# Patient Record
Sex: Female | Born: 1950 | Race: Black or African American | Hispanic: No | Marital: Single | Smoking: Current some day smoker
Health system: Southern US, Community
[De-identification: ages and names within clinical notes are randomized; demographics above are authoritative.]

## PROBLEM LIST (undated history)

## (undated) DIAGNOSIS — D473 Essential (hemorrhagic) thrombocythemia: Secondary | ICD-10-CM

## (undated) DIAGNOSIS — J9600 Acute respiratory failure, unspecified whether with hypoxia or hypercapnia: Secondary | ICD-10-CM

## (undated) DIAGNOSIS — R079 Chest pain, unspecified: Secondary | ICD-10-CM

## (undated) DIAGNOSIS — M109 Gout, unspecified: Secondary | ICD-10-CM

## (undated) DIAGNOSIS — I5020 Unspecified systolic (congestive) heart failure: Secondary | ICD-10-CM

## (undated) DIAGNOSIS — I639 Cerebral infarction, unspecified: Secondary | ICD-10-CM

## (undated) DIAGNOSIS — E041 Nontoxic single thyroid nodule: Secondary | ICD-10-CM

## (undated) DIAGNOSIS — D649 Anemia, unspecified: Secondary | ICD-10-CM

## (undated) DIAGNOSIS — A419 Sepsis, unspecified organism: Secondary | ICD-10-CM

## (undated) DIAGNOSIS — L0291 Cutaneous abscess, unspecified: Secondary | ICD-10-CM

## (undated) DIAGNOSIS — J181 Lobar pneumonia, unspecified organism: Secondary | ICD-10-CM

## (undated) DIAGNOSIS — A599 Trichomoniasis, unspecified: Secondary | ICD-10-CM

## (undated) DIAGNOSIS — N2581 Secondary hyperparathyroidism of renal origin: Secondary | ICD-10-CM

## (undated) DIAGNOSIS — R4189 Other symptoms and signs involving cognitive functions and awareness: Secondary | ICD-10-CM

## (undated) DIAGNOSIS — N183 Chronic kidney disease, stage 3 unspecified: Secondary | ICD-10-CM

## (undated) DIAGNOSIS — Z720 Tobacco use: Secondary | ICD-10-CM

## (undated) DIAGNOSIS — I2510 Atherosclerotic heart disease of native coronary artery without angina pectoris: Secondary | ICD-10-CM

## (undated) DIAGNOSIS — J810 Acute pulmonary edema: Secondary | ICD-10-CM

## (undated) DIAGNOSIS — I517 Cardiomegaly: Secondary | ICD-10-CM

## (undated) DIAGNOSIS — I248 Other forms of acute ischemic heart disease: Secondary | ICD-10-CM

## (undated) DIAGNOSIS — I169 Hypertensive crisis, unspecified: Secondary | ICD-10-CM

## (undated) DIAGNOSIS — K5520 Angiodysplasia of colon without hemorrhage: Secondary | ICD-10-CM

## (undated) DIAGNOSIS — J811 Chronic pulmonary edema: Secondary | ICD-10-CM

## (undated) DIAGNOSIS — K922 Gastrointestinal hemorrhage, unspecified: Secondary | ICD-10-CM

## (undated) DIAGNOSIS — E785 Hyperlipidemia, unspecified: Secondary | ICD-10-CM

## (undated) DIAGNOSIS — N2889 Other specified disorders of kidney and ureter: Secondary | ICD-10-CM

## (undated) DIAGNOSIS — I739 Peripheral vascular disease, unspecified: Secondary | ICD-10-CM

## (undated) DIAGNOSIS — F1410 Cocaine abuse, uncomplicated: Secondary | ICD-10-CM

## (undated) DIAGNOSIS — H409 Unspecified glaucoma: Secondary | ICD-10-CM

## (undated) DIAGNOSIS — I5042 Chronic combined systolic (congestive) and diastolic (congestive) heart failure: Secondary | ICD-10-CM

## (undated) DIAGNOSIS — M19012 Primary osteoarthritis, left shoulder: Secondary | ICD-10-CM

## (undated) DIAGNOSIS — R449 Unspecified symptoms and signs involving general sensations and perceptions: Secondary | ICD-10-CM

## (undated) DIAGNOSIS — I2489 Other forms of acute ischemic heart disease: Secondary | ICD-10-CM

## (undated) DIAGNOSIS — D62 Acute posthemorrhagic anemia: Secondary | ICD-10-CM

## (undated) DIAGNOSIS — J9601 Acute respiratory failure with hypoxia: Secondary | ICD-10-CM

## (undated) DIAGNOSIS — I10 Essential (primary) hypertension: Secondary | ICD-10-CM

## (undated) DIAGNOSIS — IMO0001 Reserved for inherently not codable concepts without codable children: Secondary | ICD-10-CM

## (undated) DIAGNOSIS — J189 Pneumonia, unspecified organism: Secondary | ICD-10-CM

## (undated) DIAGNOSIS — R195 Other fecal abnormalities: Secondary | ICD-10-CM

## (undated) HISTORY — DX: Angiodysplasia of colon without hemorrhage: K55.20

## (undated) HISTORY — DX: Peripheral vascular disease, unspecified: I73.9

## (undated) HISTORY — DX: Secondary hyperparathyroidism of renal origin: N25.81

## (undated) HISTORY — DX: Chronic kidney disease, stage 3 unspecified: N18.30

## (undated) HISTORY — DX: Chronic kidney disease, stage 3 (moderate): N18.3

## (undated) HISTORY — DX: Primary osteoarthritis, left shoulder: M19.012

## (undated) HISTORY — DX: Other specified disorders of kidney and ureter: N28.89

## (undated) HISTORY — DX: Morbid (severe) obesity due to excess calories: E66.01

## (undated) HISTORY — DX: Trichomoniasis, unspecified: A59.9

## (undated) HISTORY — PX: CARDIAC CATHETERIZATION: SHX172

## (undated) HISTORY — DX: Essential (primary) hypertension: I10

## (undated) HISTORY — DX: Unspecified systolic (congestive) heart failure: I50.20

## (undated) HISTORY — DX: Chronic combined systolic (congestive) and diastolic (congestive) heart failure: I50.42

---
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
## 2007-01-04 ENCOUNTER — Inpatient Hospital Stay (HOSPITAL_COMMUNITY): Admission: EM | Admit: 2007-01-04 | Discharge: 2007-01-06 | Payer: Self-pay | Admitting: Emergency Medicine

## 2007-01-05 ENCOUNTER — Encounter (INDEPENDENT_AMBULATORY_CARE_PROVIDER_SITE_OTHER): Payer: Self-pay | Admitting: Cardiology

## 2007-01-05 ENCOUNTER — Encounter (INDEPENDENT_AMBULATORY_CARE_PROVIDER_SITE_OTHER): Payer: Self-pay | Admitting: Neurology

## 2007-01-05 ENCOUNTER — Ambulatory Visit: Payer: Self-pay | Admitting: Physical Medicine & Rehabilitation

## 2007-01-27 ENCOUNTER — Ambulatory Visit: Payer: Self-pay | Admitting: Nurse Practitioner

## 2007-02-10 ENCOUNTER — Ambulatory Visit: Payer: Self-pay | Admitting: *Deleted

## 2007-02-16 ENCOUNTER — Encounter (INDEPENDENT_AMBULATORY_CARE_PROVIDER_SITE_OTHER): Payer: Self-pay | Admitting: Nurse Practitioner

## 2007-02-16 ENCOUNTER — Ambulatory Visit: Payer: Self-pay | Admitting: Nurse Practitioner

## 2007-02-24 ENCOUNTER — Ambulatory Visit (HOSPITAL_COMMUNITY): Admission: RE | Admit: 2007-02-24 | Discharge: 2007-02-24 | Payer: Self-pay | Admitting: Family Medicine

## 2007-04-01 ENCOUNTER — Ambulatory Visit: Payer: Self-pay | Admitting: Internal Medicine

## 2007-08-20 ENCOUNTER — Ambulatory Visit: Payer: Self-pay | Admitting: Internal Medicine

## 2007-08-30 ENCOUNTER — Emergency Department (HOSPITAL_COMMUNITY): Admission: EM | Admit: 2007-08-30 | Discharge: 2007-08-30 | Payer: Self-pay | Admitting: Emergency Medicine

## 2007-11-04 ENCOUNTER — Emergency Department (HOSPITAL_COMMUNITY): Admission: EM | Admit: 2007-11-04 | Discharge: 2007-11-04 | Payer: Self-pay | Admitting: Emergency Medicine

## 2008-02-08 ENCOUNTER — Encounter (INDEPENDENT_AMBULATORY_CARE_PROVIDER_SITE_OTHER): Payer: Self-pay | Admitting: Nurse Practitioner

## 2008-02-08 ENCOUNTER — Ambulatory Visit: Payer: Self-pay | Admitting: Internal Medicine

## 2008-02-08 LAB — CONVERTED CEMR LAB
ALT: 15 units/L (ref 0–35)
AST: 16 units/L (ref 0–37)
Albumin: 4 g/dL (ref 3.5–5.2)
Alkaline Phosphatase: 88 units/L (ref 39–117)
CO2: 26 meq/L (ref 19–32)
Calcium: 9.6 mg/dL (ref 8.4–10.5)
Chloride: 105 meq/L (ref 96–112)
Eosinophils Absolute: 0.1 10*3/uL (ref 0.0–0.7)
Glucose, Bld: 159 mg/dL — ABNORMAL HIGH (ref 70–99)
Hemoglobin: 14.7 g/dL (ref 12.0–15.0)
Lymphocytes Relative: 47 % — ABNORMAL HIGH (ref 12–46)
Lymphs Abs: 3.1 10*3/uL (ref 0.7–4.0)
MCHC: 32.5 g/dL (ref 30.0–36.0)
MCV: 80.9 fL (ref 78.0–100.0)
Monocytes Absolute: 0.6 10*3/uL (ref 0.1–1.0)
Neutro Abs: 2.8 10*3/uL (ref 1.7–7.7)
Platelets: 288 10*3/uL (ref 150–400)
RBC: 5.59 M/uL — ABNORMAL HIGH (ref 3.87–5.11)
TSH: 2.154 microintl units/mL (ref 0.350–5.50)
Total Bilirubin: 0.3 mg/dL (ref 0.3–1.2)
Total Protein: 7.1 g/dL (ref 6.0–8.3)

## 2008-03-21 ENCOUNTER — Encounter (INDEPENDENT_AMBULATORY_CARE_PROVIDER_SITE_OTHER): Payer: Self-pay | Admitting: Nurse Practitioner

## 2008-03-21 ENCOUNTER — Ambulatory Visit: Payer: Self-pay | Admitting: Family Medicine

## 2008-03-21 LAB — CONVERTED CEMR LAB
Cholesterol: 154 mg/dL (ref 0–200)
HDL: 33 mg/dL — ABNORMAL LOW (ref >39)
VLDL: 34 mg/dL (ref 0–40)

## 2008-03-22 ENCOUNTER — Emergency Department (HOSPITAL_COMMUNITY): Admission: EM | Admit: 2008-03-22 | Discharge: 2008-03-22 | Payer: Self-pay | Admitting: Emergency Medicine

## 2008-03-24 ENCOUNTER — Ambulatory Visit (HOSPITAL_COMMUNITY): Admission: RE | Admit: 2008-03-24 | Discharge: 2008-03-24 | Payer: Self-pay | Admitting: Family Medicine

## 2008-05-04 ENCOUNTER — Encounter (INDEPENDENT_AMBULATORY_CARE_PROVIDER_SITE_OTHER): Payer: Self-pay | Admitting: Nurse Practitioner

## 2008-05-04 ENCOUNTER — Ambulatory Visit: Payer: Self-pay | Admitting: Family Medicine

## 2008-05-04 LAB — CONVERTED CEMR LAB
Calcium: 9.6 mg/dL (ref 8.4–10.5)
Glucose, Bld: 131 mg/dL — ABNORMAL HIGH (ref 70–99)
LDL Cholesterol: 93 mg/dL (ref 0–99)
Total Bilirubin: 0.5 mg/dL (ref 0.3–1.2)
Triglycerides: 96 mg/dL (ref <150)

## 2009-01-10 ENCOUNTER — Ambulatory Visit: Payer: Self-pay | Admitting: Family Medicine

## 2009-01-10 LAB — CONVERTED CEMR LAB
Alkaline Phosphatase: 131 units/L — ABNORMAL HIGH (ref 39–117)
CO2: 23 meq/L (ref 19–32)
Calcium: 9.4 mg/dL (ref 8.4–10.5)
Creatinine, Ser: 1.02 mg/dL (ref 0.40–1.20)
Total Bilirubin: 0.3 mg/dL (ref 0.3–1.2)

## 2009-01-11 ENCOUNTER — Encounter (INDEPENDENT_AMBULATORY_CARE_PROVIDER_SITE_OTHER): Payer: Self-pay | Admitting: Internal Medicine

## 2009-01-23 ENCOUNTER — Ambulatory Visit (HOSPITAL_COMMUNITY): Admission: RE | Admit: 2009-01-23 | Discharge: 2009-01-23 | Payer: Self-pay | Admitting: Internal Medicine

## 2009-02-08 ENCOUNTER — Ambulatory Visit: Payer: Self-pay | Admitting: Internal Medicine

## 2009-02-08 LAB — CONVERTED CEMR LAB
Cholesterol: 210 mg/dL — ABNORMAL HIGH (ref 0–200)
HDL: 33 mg/dL — ABNORMAL LOW (ref >39)
Triglycerides: 136 mg/dL (ref <150)
VLDL: 27 mg/dL (ref 0–40)

## 2009-02-12 ENCOUNTER — Ambulatory Visit: Payer: Self-pay | Admitting: Internal Medicine

## 2009-04-05 ENCOUNTER — Ambulatory Visit (HOSPITAL_COMMUNITY): Admission: RE | Admit: 2009-04-05 | Discharge: 2009-04-05 | Payer: Self-pay | Admitting: Internal Medicine

## 2009-06-05 ENCOUNTER — Ambulatory Visit: Payer: Self-pay | Admitting: Internal Medicine

## 2009-06-05 ENCOUNTER — Encounter (INDEPENDENT_AMBULATORY_CARE_PROVIDER_SITE_OTHER): Payer: Self-pay | Admitting: Internal Medicine

## 2009-06-05 LAB — CONVERTED CEMR LAB
BUN: 12 mg/dL (ref 6–23)
Calcium: 9.2 mg/dL (ref 8.4–10.5)
Chloride: 108 meq/L (ref 96–112)
Creatinine, Ser: 0.92 mg/dL (ref 0.40–1.20)
Glucose, Bld: 112 mg/dL — ABNORMAL HIGH (ref 70–99)
Microalb, Ur: 2.31 mg/dL — ABNORMAL HIGH (ref 0.00–1.89)
Potassium: 4.5 meq/L (ref 3.5–5.3)

## 2009-06-12 ENCOUNTER — Ambulatory Visit (HOSPITAL_COMMUNITY): Admission: RE | Admit: 2009-06-12 | Discharge: 2009-06-12 | Payer: Self-pay | Admitting: Internal Medicine

## 2009-06-14 ENCOUNTER — Ambulatory Visit: Payer: Self-pay | Admitting: Internal Medicine

## 2009-06-26 ENCOUNTER — Encounter (INDEPENDENT_AMBULATORY_CARE_PROVIDER_SITE_OTHER): Payer: Self-pay | Admitting: Internal Medicine

## 2009-06-26 ENCOUNTER — Ambulatory Visit: Payer: Self-pay | Admitting: Internal Medicine

## 2009-06-26 LAB — CONVERTED CEMR LAB
HDL: 32 mg/dL — ABNORMAL LOW (ref >39)
LDL Cholesterol: 105 mg/dL — ABNORMAL HIGH (ref 0–99)
Triglycerides: 127 mg/dL (ref <150)

## 2009-07-03 ENCOUNTER — Ambulatory Visit: Payer: Self-pay | Admitting: Internal Medicine

## 2009-07-04 ENCOUNTER — Other Ambulatory Visit: Admission: RE | Admit: 2009-07-04 | Discharge: 2009-07-04 | Payer: Self-pay | Admitting: Interventional Radiology

## 2009-07-04 ENCOUNTER — Encounter: Admission: RE | Admit: 2009-07-04 | Discharge: 2009-07-04 | Payer: Self-pay | Admitting: Internal Medicine

## 2009-07-04 ENCOUNTER — Encounter (INDEPENDENT_AMBULATORY_CARE_PROVIDER_SITE_OTHER): Payer: Self-pay | Admitting: Interventional Radiology

## 2009-10-30 ENCOUNTER — Ambulatory Visit: Payer: Self-pay | Admitting: Family Medicine

## 2009-10-31 ENCOUNTER — Encounter (INDEPENDENT_AMBULATORY_CARE_PROVIDER_SITE_OTHER): Payer: Self-pay | Admitting: Internal Medicine

## 2009-11-27 ENCOUNTER — Ambulatory Visit: Payer: Self-pay | Admitting: Internal Medicine

## 2009-11-27 ENCOUNTER — Encounter (INDEPENDENT_AMBULATORY_CARE_PROVIDER_SITE_OTHER): Payer: Self-pay | Admitting: Internal Medicine

## 2009-11-27 LAB — CONVERTED CEMR LAB
AST: 15 units/L (ref 0–37)
Albumin: 4.1 g/dL (ref 3.5–5.2)
CO2: 21 meq/L (ref 19–32)
Calcium: 9.6 mg/dL (ref 8.4–10.5)
Cholesterol: 234 mg/dL — ABNORMAL HIGH (ref 0–200)
Creatinine, Ser: 1.03 mg/dL (ref 0.40–1.20)
LDL Cholesterol: 164 mg/dL — ABNORMAL HIGH (ref 0–99)
Total Bilirubin: 0.5 mg/dL (ref 0.3–1.2)
Total CHOL/HDL Ratio: 7.8
VLDL: 40 mg/dL (ref 0–40)

## 2010-02-08 ENCOUNTER — Ambulatory Visit: Payer: Self-pay | Admitting: Internal Medicine

## 2010-02-08 LAB — CONVERTED CEMR LAB: Cholesterol: 195 mg/dL (ref 0–200)

## 2010-03-11 ENCOUNTER — Ambulatory Visit: Payer: Self-pay | Admitting: Internal Medicine

## 2010-06-03 ENCOUNTER — Ambulatory Visit: Payer: Self-pay | Admitting: Family Medicine

## 2010-06-03 ENCOUNTER — Encounter (INDEPENDENT_AMBULATORY_CARE_PROVIDER_SITE_OTHER): Payer: Self-pay | Admitting: Internal Medicine

## 2010-06-03 LAB — CONVERTED CEMR LAB
AST: 13 units/L (ref 0–37)
Calcium: 9.3 mg/dL (ref 8.4–10.5)
Glucose, Bld: 261 mg/dL — ABNORMAL HIGH (ref 70–99)
Sodium: 139 meq/L (ref 135–145)

## 2010-06-12 ENCOUNTER — Ambulatory Visit: Payer: Self-pay | Admitting: Internal Medicine

## 2011-01-20 ENCOUNTER — Encounter: Payer: Self-pay | Admitting: Internal Medicine

## 2011-05-16 NOTE — Discharge Summary (Signed)
[name] [name], [name] [name]                 ACCOUNT NO.:  [id number]   MEDICAL RECORD NO.:  [id number]          PATIENT TYPE:  INP   LOCATION:  3014                         FACILITY:  [facility]   PHYSICIAN:  Brittini Brubeck doctor         DATE OF BIRTH:  [birth year]   DATE OF ADMISSION:  01/04/2007  DATE OF DISCHARGE:  01/06/2007                               DISCHARGE SUMMARY   DISCHARGE DIAGNOSES:  1. 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  2. Newly diagnosed diabetes.  3. New diagnosis of dyslipidemia.  4. Obesity.  5. Cigarette smoker.   DISCHARGE MEDICINES:  1. Amaryl 2 mg a day.  2. Aspirin 325 mg a day.  3. Zocor 20 mg a day.  4. Metformin 500 mg b.i.d.   STUDIES PERFORMED:  1. CT of the brain on admission shows a mild small vessel white matter      ischemic changes in both cerebral hemispheres, no acute hemorrhage.  2. MRI of the brain shows a 6 x 10-mm area of restricted diffusion in      the posterior __________  internal capsule on the right.      Widespread periventricular white matter abnormalities, cannot      exclude chronic MS.  3. MRA of the brain shows wide spread intracranial atherosclerotic      disease.  4. Chest x-ray shows no active chest disease.  5. Followup CT, post t-PA, shows no acute hemorrhage.  6. A 2-D echocardiogram shows an EF of [id number], with no embolic source.  7. Carotid Doppler shows no significant ICA stenosis on the left;      right with 40-60% ICA stenosis, __________ antegrade bilaterally.  8. Transcranial Doppler completed, results are pending.  9. EKG shows normal sinus rhythm with sinus arrhythmia, possible      lateral infarct age un-determined.   LABORATORY STUDIES:  CBC:  Normal.  Chemistry with glucose 305, BUN 8,  creatinine 0.75.  Coagulation studies normal.  Liver function tests  normal.  Albumin 3.1.  Cholesterol 193, triglycerides 242, HDL 26, and  LDL 119.  Urinalysis with 3-6 white blood cells, 7-10 red blood  cells,  many bacteria but small leukocyte esterase.  Homocystine is 9.9.  Hemoglobin A1c was drawn, results are pending.  Cardiac enzymes are  negative.   HISTORY OF PRESENT ILLNESS:  [name] is a 60 year old African  American female with no known history of chronic medical problems.  She  was at home the morning of admission trying to get ready to go a  funeral.  She says at approximately 9:40, she sat down in a recliner and  then momentarily tried to get back up and noticed she had weakness in  her left leg and was unable to stand.  She tried to reach out with her  left arm and noticed that her left arm was weak as well.  She sat in the  recliner for a little and eventually was able to get up and make her way  to the kitchen by holding onto furniture but she still  had definite  weakness in the left arm and leg.  She was able to open the floor for  her aunt who came over at approximately 11 a.m. and noticed the weakness  also.  At that point, EMS was called.  She was brought to [facility] emergency room, where a code stroke was called en route.  On  arrival, the patient was noted to have left upper, greater than left  lower extremity weakness along with sensory changes.  A CT of the brain  showed no acute abnormality.  She was given full-dose IV t-PA for her  acute stroke and admitted to the ICU.   HOSPITAL COURSE:  The patient tolerated TPA without any resulting  hemorrhage or other deficits.  Once TPA 24 hours was completed, she was  moved to the floor.  PT, OT evaluated her, where she has some definite  weakness, though it was somewhat an embellished and occasionally  functional exam.  She was too high level for rehab but could benefit  from [place] physical therapy and occupational therapy.  During her hospitalization, the patient was also diagnosed with new  diabetes.  She had blood glucose in the 300's to 400's initially and was  put on the Glucomander.   She was transitioned to Lantus and then to  Amaryl and metformin at discharge.  She needs to get her self a meter at  time of discharge and follow up soon with [facility], as she has no  primary care physician.  The patient also with new diagnosis of dyslipidemia for which she was  started on Zocor.  She was started on aspirin for secondary stroke  prevention and was advised to stop smoking and lose weight.   Of note, per rehab nurse, the patient has attempted to get disability in  the past and has been denied.   CONDITION ON DISCHARGE:  The patient alert and oriented x3.  Speech  clear.  No aphasia.  Her she has mild left upper extremity drift, weak  left grip, decreased fine motor movement on the left.  Her left leg is  weak 2/5, though she can be seen walking independently from bathroom to  chair if you are standing outside her room.   DISCHARGE/PLAN:  1. Discharge home with family.  2. Home health PT, OT and nurse.  3. Needs tight risk factor control, especially blood pressure and      diabetes.  4. Needs to follow up with a primary care physician within 1 month,      has been advised to call [facility] on the day of discharge and      set up an eligibility appointment and an appointment to be seen as      soon as possible.  5. The patient advised to get glucose meter and check CBG b.i.d. a.c.      breakfast and dinner.  6. New Zocor.  7. New Amaryl and Glucophage.  8. Follow up Dr. Antony Contras in 2-3 months for her stroke.      Burnetta Sabin, N.P.    ______________________________  Fredrick Geoghegan doctor    SB/MEDQ  D:  01/06/2007  T:  01/06/2007  Job:  [id number]   cc:   Pramod P. Leonie Man, [facility]

## 2011-05-16 NOTE — H&P (Signed)
NAMEDANECIA, ROULETTE                 ACCOUNT NO.:  [id number]   MEDICAL RECORD NO.:  [id number]          PATIENT TYPE:  EMS   LOCATION:  MAJO                         FACILITY:  [facility]   PHYSICIAN:  Michael L. Reynolds, M.D.DATE OF BIRTH:  May 02, 1951   DATE OF ADMISSION:  01/04/2007  DATE OF DISCHARGE:                              HISTORY & PHYSICAL   CHIEF COMPLAINT:  Code stroke with left-sided weakness.   HPI:  This is the initial [facility] stroke service admission for this  60 year old woman with no known chronic medical problems.  The patient  was at home this morning trying to go to a funeral.  She says that  approximately 9:40 a.m. she sat down in a recliner and then momentarily  tried to get back up and noticed that she had weakness in her left leg  and was unable to stand.  She said she tried to reach out with her left  arm and noticed that the left arm was weak as well.  She sat in the  recliner for a little while and eventually was able to get up and make  her way to the kitchen by holding on to furniture but she still had  definite weakness in the left arm and the weakness of the left leg.  She  was able to open the door for her aunt who came over at approximately 11  a.m. and noted the weakness.  At that point, EMS was called and she was  brought to [facility] and a code stroke was called en route.  On arrival, the patient was noted to have left upper greater than lower  extremity weakness along with some sensory changes.  She denies any  pain, any new speech problems, or any visual symptoms.  She denies any  associated chest pain, shortness of breath, nausea, vomiting, headache  or alteration of consciousness.  CBG en route to the emergency room was  in the 350 range by EMS.   PAST MEDICAL HISTORY:  She states that she has had bronchitis for the  last few weeks and that this has caused a bout of hoarseness, but beyond  that, she denies chronic medical problems,  any previous hospitalizations  or surgeries.   FAMILY HISTORY:  Remarkable for hypertension, diabetes, and stroke.   SOCIAL HISTORY:  She lives alone.  She smokes a half a pack of  cigarettes a day.  Denies any alcohol or illicit drugs.  She is normally  independent.   ALLERGIES:  NO KNOWN DRUG ALLERGIES.   MEDICATIONS:  None.   REVIEW OF SYSTEMS:  Hoarse for 1 month.  She also reports allergy and  sinus symptoms, including congestion and runny nose.  Otherwise, full  10-system review of systems is negative except as outlined in the HPI  and in the admission nursing record.   PHYSICAL EXAMINATION:  VITAL SIGNS:  Temperature 97.7, blood pressure  157/81, pulse 55, respirations 19, O2 sat 99% on 2 L O2 nasal cannula.  GENERAL EXAMINATION:  This is an awake, alert, obese-appearing woman  supine in the  hospital bed in no evident distress.  HEAD:  Cranium is normocephalic, atraumatic.  Oropharynx is benign.  NECK:  Supple without carotid or supraclavicular bruits.  HEART:  Regular rate and rhythm without murmurs.  CHEST:  Clear to auscultation bilaterally.  ABDOMEN:  Obese, soft, diminished bowel sounds.  EXTREMITIES:  Trace edema in the left leg, 2+ pulses.  NEUROLOGIC EXAM:  Mental status:  She is awake and fully alert.  She is  oriented to place and time.  Recent and remote memory are intact.  Attention span, concentration, fund of knowledge are all appropriate.  Speech is fluent and dysarthric.  There are no defects to  confrontational naming and she can repeat phrases.  Mood is euthymic and  affect appropriate.  Cranial nerves:  Pupils are 3 mm, briskly reacted  to 2.  Extraocular movements full without nystagmus.  Visual fields full  to confrontation.  Face, tongue, and palate move normally and  symmetrically.  Motor:  Normal bulk and tone.  In the left upper  extremity, she has a little bit of weakness, especially proximally in  the hand and a little bit of drift.  In the left  lower extremity, she  has less in anti-gravity strength.  The right side is normal.  Sensory:  She reports to me there is pinprick sensation in the left lower  extremity and less so over the left upper extremity and intact sensation  on the right.  Cerebellar:  Rapid movements performed adequately.  Finger-to-nose is performed adequately.  Gait is deferred.  She has an  upgoing toe on the left.   LABORATORY REVIEW:  CBC:  White count 4.7, hemoglobin 14.3, platelets  281,000.  CMET is remarkable for an elevated glucose of 305 otherwise  normal.  Coags were normal.  EKG demonstrates poor R wave progression in  the lateral leads but no acute finding.  CT of head demonstrates no  acute finding and no real evidence of previous stroke.   IMPRESSION:  1. Right brain stroke, suspect anterior cerebral artery territory with      left lower greater than upper extremity weakness.  2. New-onset diabetes mellitus.  3. Obesity.   PLAN:  She has received full dosage of TPA in the emergency room and  will subsequently be admitted to the stroke service.  She will go to the  ICU overnight for observation and routine post TPA care.  She will need  a full stroke workup, including MRI, MRA, carotid transcranial Dopplers,  transthoracic and possibly transesophageal echocardiograms as well as  telemetry monitoring and routine stroke labs, and we will have Physical,  Occupational, and Speech therapy evaluate her while she is here.  Stroke  service to follow.      Michael L. Doy Mince, M.D.  Electronically Signed     MLR/MEDQ  D:  01/04/2007  T:  01/05/2007  Job:  [id number]

## 2011-09-22 LAB — RAPID URINE DRUG SCREEN, HOSP PERFORMED
Barbiturates: NOT DETECTED
Benzodiazepines: NOT DETECTED
Cocaine: POSITIVE — AB
Opiates: NOT DETECTED
Tetrahydrocannabinol: NOT DETECTED

## 2011-09-22 LAB — DIFFERENTIAL
Basophils Absolute: 0.1
Eosinophils Absolute: 0.2
Lymphs Abs: 2.5
Neutro Abs: 2.9

## 2011-09-22 LAB — POCT I-STAT, CHEM 8
BUN: 15
Calcium, Ion: 1.17
Chloride: 106
Creatinine, Ser: 1.1
HCT: 45
Potassium: 4
TCO2: 27

## 2011-09-22 LAB — CBC
HCT: 43.6
Hemoglobin: 14.5
MCHC: 33.2
MCV: 79.5
Platelets: 298
RBC: 5.49 — ABNORMAL HIGH

## 2011-09-22 LAB — POCT CARDIAC MARKERS
CKMB, poc: <1 — ABNORMAL LOW
Myoglobin, poc: 40.8
Myoglobin, poc: 43.6
Operator id: 277751
Troponin i, poc: <0.05

## 2011-10-10 LAB — I-STAT 8, (EC8 V) (CONVERTED LAB)
BUN: 12
Bicarbonate: 29.2 — ABNORMAL HIGH
HCT: 45
Hemoglobin: 15.3 — ABNORMAL HIGH
Operator id: 277751
Sodium: 144
TCO2: 31

## 2011-10-10 LAB — POCT CARDIAC MARKERS
CKMB, poc: 1.5
Myoglobin, poc: 57.1

## 2012-06-29 ENCOUNTER — Encounter (HOSPITAL_COMMUNITY)
Admission: EM | Disposition: A | Discharge status: Home / Self Care | Payer: Self-pay | Source: Home / Self Care | Attending: Internal Medicine

## 2012-06-29 ENCOUNTER — Encounter (HOSPITAL_COMMUNITY): Payer: Self-pay | Admitting: Emergency Medicine

## 2012-06-29 ENCOUNTER — Emergency Department (HOSPITAL_COMMUNITY): Payer: Medicaid Other

## 2012-06-29 ENCOUNTER — Inpatient Hospital Stay (HOSPITAL_COMMUNITY)
Admission: EM | Admit: 2012-06-29 | Discharge: 2012-07-15 | DRG: 233 | Disposition: A | Discharge status: Home / Self Care | Payer: Medicaid Other | Attending: Cardiothoracic Surgery | Admitting: Cardiothoracic Surgery

## 2012-06-29 DIAGNOSIS — E8779 Other fluid overload: Secondary | ICD-10-CM | POA: Diagnosis not present

## 2012-06-29 DIAGNOSIS — Z951 Presence of aortocoronary bypass graft: Secondary | ICD-10-CM

## 2012-06-29 DIAGNOSIS — I214 Non-ST elevation (NSTEMI) myocardial infarction: Secondary | ICD-10-CM

## 2012-06-29 DIAGNOSIS — I2109 ST elevation (STEMI) myocardial infarction involving other coronary artery of anterior wall: Principal | ICD-10-CM | POA: Diagnosis present

## 2012-06-29 DIAGNOSIS — Z9119 Patient's noncompliance with other medical treatment and regimen: Secondary | ICD-10-CM

## 2012-06-29 DIAGNOSIS — E871 Hypo-osmolality and hyponatremia: Secondary | ICD-10-CM | POA: Diagnosis present

## 2012-06-29 DIAGNOSIS — E1110 Type 2 diabetes mellitus with ketoacidosis without coma: Secondary | ICD-10-CM

## 2012-06-29 DIAGNOSIS — E785 Hyperlipidemia, unspecified: Secondary | ICD-10-CM | POA: Diagnosis present

## 2012-06-29 DIAGNOSIS — R29898 Other symptoms and signs involving the musculoskeletal system: Secondary | ICD-10-CM | POA: Diagnosis present

## 2012-06-29 DIAGNOSIS — E1010 Type 1 diabetes mellitus with ketoacidosis without coma: Secondary | ICD-10-CM | POA: Diagnosis present

## 2012-06-29 DIAGNOSIS — I10 Essential (primary) hypertension: Secondary | ICD-10-CM

## 2012-06-29 DIAGNOSIS — R718 Other abnormality of red blood cells: Secondary | ICD-10-CM

## 2012-06-29 DIAGNOSIS — I639 Cerebral infarction, unspecified: Secondary | ICD-10-CM

## 2012-06-29 DIAGNOSIS — K5900 Constipation, unspecified: Secondary | ICD-10-CM | POA: Diagnosis not present

## 2012-06-29 DIAGNOSIS — Z6841 Body Mass Index (BMI) 40.0 and over, adult: Secondary | ICD-10-CM

## 2012-06-29 DIAGNOSIS — F1410 Cocaine abuse, uncomplicated: Secondary | ICD-10-CM

## 2012-06-29 DIAGNOSIS — F17200 Nicotine dependence, unspecified, uncomplicated: Secondary | ICD-10-CM | POA: Diagnosis present

## 2012-06-29 DIAGNOSIS — Z91199 Patient's noncompliance with other medical treatment and regimen due to unspecified reason: Secondary | ICD-10-CM

## 2012-06-29 DIAGNOSIS — R739 Hyperglycemia, unspecified: Secondary | ICD-10-CM

## 2012-06-29 DIAGNOSIS — A5900 Urogenital trichomoniasis, unspecified: Secondary | ICD-10-CM | POA: Diagnosis present

## 2012-06-29 DIAGNOSIS — I2589 Other forms of chronic ischemic heart disease: Secondary | ICD-10-CM | POA: Diagnosis present

## 2012-06-29 DIAGNOSIS — E669 Obesity, unspecified: Secondary | ICD-10-CM | POA: Diagnosis present

## 2012-06-29 DIAGNOSIS — H409 Unspecified glaucoma: Secondary | ICD-10-CM | POA: Diagnosis present

## 2012-06-29 DIAGNOSIS — I69998 Other sequelae following unspecified cerebrovascular disease: Secondary | ICD-10-CM

## 2012-06-29 DIAGNOSIS — R079 Chest pain, unspecified: Secondary | ICD-10-CM

## 2012-06-29 DIAGNOSIS — I2510 Atherosclerotic heart disease of native coronary artery without angina pectoris: Secondary | ICD-10-CM

## 2012-06-29 DIAGNOSIS — Z79899 Other long term (current) drug therapy: Secondary | ICD-10-CM

## 2012-06-29 DIAGNOSIS — Z8673 Personal history of transient ischemic attack (TIA), and cerebral infarction without residual deficits: Secondary | ICD-10-CM | POA: Insufficient documentation

## 2012-06-29 DIAGNOSIS — N179 Acute kidney failure, unspecified: Secondary | ICD-10-CM

## 2012-06-29 DIAGNOSIS — Z7982 Long term (current) use of aspirin: Secondary | ICD-10-CM

## 2012-06-29 DIAGNOSIS — F1910 Other psychoactive substance abuse, uncomplicated: Secondary | ICD-10-CM

## 2012-06-29 DIAGNOSIS — F1411 Cocaine abuse, in remission: Secondary | ICD-10-CM | POA: Diagnosis present

## 2012-06-29 HISTORY — DX: Unspecified glaucoma: H40.9

## 2012-06-29 HISTORY — DX: Tobacco use: Z72.0

## 2012-06-29 HISTORY — DX: Unspecified symptoms and signs involving general sensations and perceptions: R44.9

## 2012-06-29 HISTORY — DX: Cocaine abuse, uncomplicated: F14.10

## 2012-06-29 HISTORY — DX: Hyperlipidemia, unspecified: E78.5

## 2012-06-29 HISTORY — DX: Other symptoms and signs involving cognitive functions and awareness: R41.89

## 2012-06-29 HISTORY — DX: Cerebral infarction, unspecified: I63.9

## 2012-06-29 HISTORY — PX: LEFT HEART CATHETERIZATION WITH CORONARY ANGIOGRAM: SHX5451

## 2012-06-29 HISTORY — DX: Nontoxic single thyroid nodule: E04.1

## 2012-06-29 LAB — BASIC METABOLIC PANEL
BUN: 22 mg/dL (ref 6–23)
CO2: 18 mEq/L — ABNORMAL LOW (ref 19–32)
Calcium: 10.3 mg/dL (ref 8.4–10.5)
Calcium: 10.4 mg/dL (ref 8.4–10.5)
Chloride: 91 mEq/L — ABNORMAL LOW (ref 96–112)
GFR calc Af Amer: 67 mL/min — ABNORMAL LOW (ref >90)
GFR calc Af Amer: 67 mL/min — ABNORMAL LOW (ref >90)
GFR calc non Af Amer: 57 mL/min — ABNORMAL LOW (ref >90)
GFR calc non Af Amer: 58 mL/min — ABNORMAL LOW (ref >90)
Potassium: 4.2 mEq/L (ref 3.5–5.1)
Sodium: 128 mEq/L — ABNORMAL LOW (ref 135–145)
Sodium: 131 mEq/L — ABNORMAL LOW (ref 135–145)
Sodium: 133 mEq/L — ABNORMAL LOW (ref 135–145)

## 2012-06-29 LAB — CBC
Platelets: 292 10*3/uL (ref 150–400)
RBC: 5.84 MIL/uL — ABNORMAL HIGH (ref 3.87–5.11)
WBC: 17.9 10*3/uL — ABNORMAL HIGH (ref 4.0–10.5)

## 2012-06-29 LAB — URINALYSIS, ROUTINE W REFLEX MICROSCOPIC
Bilirubin Urine: NEGATIVE
Ketones, ur: NEGATIVE mg/dL
Specific Gravity, Urine: <1.005 — ABNORMAL LOW (ref 1.005–1.030)
Urobilinogen, UA: 0.2 mg/dL (ref 0.0–1.0)

## 2012-06-29 LAB — POCT I-STAT 3, ART BLOOD GAS (G3+)
Acid-base deficit: 2 mmol/L (ref 0.0–2.0)
pCO2 arterial: 35.5 mmHg (ref 35.0–45.0)
pH, Arterial: 7.408 — ABNORMAL HIGH (ref 7.350–7.400)
pO2, Arterial: 70 mmHg — ABNORMAL LOW (ref 80.0–100.0)

## 2012-06-29 LAB — URINE MICROSCOPIC-ADD ON

## 2012-06-29 LAB — PROTIME-INR
INR: 1.15 (ref 0.00–1.49)
Prothrombin Time: 14.9 seconds (ref 11.6–15.2)

## 2012-06-29 LAB — CARDIAC PANEL(CRET KIN+CKTOT+MB+TROPI)
CK, MB: 228.4 ng/mL (ref 0.3–4.0)
CK, MB: 137.8 ng/mL (ref 0.3–4.0)
Total CK: 2441 U/L — ABNORMAL HIGH (ref 7–177)

## 2012-06-29 LAB — GLUCOSE, CAPILLARY
Glucose-Capillary: 357 mg/dL — ABNORMAL HIGH (ref 70–99)
Glucose-Capillary: 347 mg/dL — ABNORMAL HIGH (ref 70–99)
Glucose-Capillary: 318 mg/dL — ABNORMAL HIGH (ref 70–99)
Glucose-Capillary: 201 mg/dL — ABNORMAL HIGH (ref 70–99)
Glucose-Capillary: 170 mg/dL — ABNORMAL HIGH (ref 70–99)

## 2012-06-29 LAB — RAPID URINE DRUG SCREEN, HOSP PERFORMED
Amphetamines: NOT DETECTED
Benzodiazepines: NOT DETECTED
Cocaine: POSITIVE — AB
Opiates: NOT DETECTED
Tetrahydrocannabinol: NOT DETECTED

## 2012-06-29 LAB — TROPONIN I: Troponin I: 17.12 ng/mL (ref <0.30)

## 2012-06-29 LAB — PRO B NATRIURETIC PEPTIDE: Pro B Natriuretic peptide (BNP): 12777 pg/mL — ABNORMAL HIGH (ref 0–125)

## 2012-06-29 SURGERY — LEFT HEART CATHETERIZATION WITH CORONARY ANGIOGRAM
Anesthesia: LOCAL

## 2012-06-29 MED ORDER — NITROGLYCERIN 0.2 MG/ML ON CALL CATH LAB
INTRAVENOUS | Status: AC
  Filled 2012-06-29: qty 1

## 2012-06-29 MED ORDER — LATANOPROST 0.005 % OP SOLN
1.0000 [drp] | Freq: Every day | OPHTHALMIC | Status: DC
  Administered 2012-06-29 – 2012-07-12 (×14): 1 [drp] via OPHTHALMIC
  Filled 2012-06-29 (×5): qty 2.5

## 2012-06-29 MED ORDER — ASPIRIN EC 81 MG PO TBEC: 81.0000 mg | DELAYED_RELEASE_TABLET | Freq: Every day | ORAL | Status: DC

## 2012-06-29 MED ORDER — BRINZOLAMIDE 1 % OP SUSP
1.0000 [drp] | Freq: Three times a day (TID) | OPHTHALMIC | Status: DC
  Administered 2012-06-29 – 2012-07-15 (×41): 1 [drp] via OPHTHALMIC
  Filled 2012-06-29 (×3): qty 10

## 2012-06-29 MED ORDER — LORAZEPAM 2 MG/ML IJ SOLN
1.0000 mg | Freq: Once | INTRAMUSCULAR | Status: AC
  Administered 2012-06-29: 1 mg via INTRAVENOUS
  Filled 2012-06-29: qty 1

## 2012-06-29 MED ORDER — LORAZEPAM 2 MG/ML IJ SOLN
1.0000 mg | Freq: Once | INTRAMUSCULAR | Status: AC
  Administered 2012-06-29: 1 mg via INTRAVENOUS

## 2012-06-29 MED ORDER — ATORVASTATIN CALCIUM 80 MG PO TABS
80.0000 mg | ORAL_TABLET | Freq: Every day | ORAL | Status: DC
  Administered 2012-06-29 – 2012-07-14 (×15): 80 mg via ORAL
  Filled 2012-06-29 (×17): qty 1

## 2012-06-29 MED ORDER — TICAGRELOR 90 MG PO TABS
180.0000 mg | ORAL_TABLET | Freq: Once | ORAL | Status: DC
  Filled 2012-06-29: qty 2

## 2012-06-29 MED ORDER — DEXTROSE-NACL 5-0.45 % IV SOLN: INTRAVENOUS | Status: DC

## 2012-06-29 MED ORDER — TICAGRELOR 90 MG PO TABS
90.0000 mg | ORAL_TABLET | Freq: Two times a day (BID) | ORAL | Status: DC
  Filled 2012-06-29: qty 1

## 2012-06-29 MED ORDER — ACETAMINOPHEN 325 MG PO TABS: 650.0000 mg | ORAL_TABLET | ORAL | Status: DC | PRN

## 2012-06-29 MED ORDER — MORPHINE SULFATE 2 MG/ML IJ SOLN: 2.0000 mg | INTRAMUSCULAR | Status: DC | PRN

## 2012-06-29 MED ORDER — SODIUM CHLORIDE 0.9 % IV SOLN: INTRAVENOUS | Status: DC

## 2012-06-29 MED ORDER — INSULIN ASPART 100 UNIT/ML ~~LOC~~ SOLN
0.0000 [IU] | Freq: Three times a day (TID) | SUBCUTANEOUS | Status: DC
  Administered 2012-06-30: 7 [IU] via SUBCUTANEOUS

## 2012-06-29 MED ORDER — HEPARIN (PORCINE) IN NACL 100-0.45 UNIT/ML-% IJ SOLN
1300.0000 [IU]/h | INTRAMUSCULAR | Status: DC
Start: 2012-06-29 — End: 2012-06-29
  Administered 2012-06-29: 1300 [IU]/h via INTRAVENOUS
  Filled 2012-06-29 (×2): qty 250

## 2012-06-29 MED ORDER — INSULIN GLARGINE 100 UNIT/ML ~~LOC~~ SOLN
30.0000 [IU] | Freq: Every day | SUBCUTANEOUS | Status: DC
  Administered 2012-06-29 – 2012-06-30 (×2): 30 [IU] via SUBCUTANEOUS

## 2012-06-29 MED ORDER — ASPIRIN 300 MG RE SUPP
300.0000 mg | RECTAL | Status: DC
  Filled 2012-06-29: qty 1

## 2012-06-29 MED ORDER — VERAPAMIL HCL 2.5 MG/ML IV SOLN
INTRAVENOUS | Status: AC
  Filled 2012-06-29: qty 2

## 2012-06-29 MED ORDER — INSULIN REGULAR BOLUS VIA INFUSION
0.0000 [IU] | Freq: Three times a day (TID) | INTRAVENOUS | Status: DC
  Administered 2012-06-29: 3.1 [IU] via INTRAVENOUS
  Filled 2012-06-29: qty 10

## 2012-06-29 MED ORDER — SODIUM CHLORIDE 0.9 % IV SOLN
INTRAVENOUS | Status: DC
  Administered 2012-06-29: 10 mL/h via INTRAVENOUS
  Administered 2012-07-08: 19:00:00 via INTRAVENOUS

## 2012-06-29 MED ORDER — TICAGRELOR 90 MG PO TABS: 90.0000 mg | ORAL_TABLET | Freq: Two times a day (BID) | ORAL | Status: DC

## 2012-06-29 MED ORDER — KCL IN DEXTROSE-NACL 20-5-0.45 MEQ/L-%-% IV SOLN
INTRAVENOUS | Status: DC
  Administered 2012-06-29: 19:00:00 via INTRAVENOUS
  Filled 2012-06-29: qty 1000

## 2012-06-29 MED ORDER — PNEUMOCOCCAL VAC POLYVALENT 25 MCG/0.5ML IJ INJ
0.5000 mL | INJECTION | INTRAMUSCULAR | Status: AC
  Administered 2012-06-30: 0.5 mL via INTRAMUSCULAR
  Filled 2012-06-29: qty 0.5

## 2012-06-29 MED ORDER — ASPIRIN EC 325 MG PO TBEC
325.0000 mg | DELAYED_RELEASE_TABLET | Freq: Every day | ORAL | Status: DC
  Administered 2012-06-30 – 2012-07-15 (×15): 325 mg via ORAL
  Filled 2012-06-29 (×17): qty 1

## 2012-06-29 MED ORDER — LORAZEPAM 2 MG/ML IJ SOLN: 1.0000 mg | Freq: Four times a day (QID) | INTRAMUSCULAR | Status: DC | PRN

## 2012-06-29 MED ORDER — SODIUM CHLORIDE 0.9 % IV SOLN
INTRAVENOUS | Status: DC
  Filled 2012-06-29: qty 1

## 2012-06-29 MED ORDER — ASPIRIN 81 MG PO CHEW: 324.0000 mg | CHEWABLE_TABLET | ORAL | Status: DC

## 2012-06-29 MED ORDER — HEPARIN BOLUS VIA INFUSION
4000.0000 [IU] | Freq: Once | INTRAVENOUS | Status: AC
  Administered 2012-06-29: 4000 [IU] via INTRAVENOUS

## 2012-06-29 MED ORDER — ONDANSETRON HCL 4 MG/2ML IJ SOLN: 4.0000 mg | Freq: Four times a day (QID) | INTRAMUSCULAR | Status: DC | PRN

## 2012-06-29 MED ORDER — PANTOPRAZOLE SODIUM 40 MG PO TBEC
40.0000 mg | DELAYED_RELEASE_TABLET | Freq: Every day | ORAL | Status: DC
  Administered 2012-06-29 – 2012-07-08 (×10): 40 mg via ORAL
  Filled 2012-06-29 (×10): qty 1

## 2012-06-29 MED ORDER — NITROGLYCERIN 0.4 MG SL SUBL: 0.4000 mg | SUBLINGUAL_TABLET | SUBLINGUAL | Status: DC | PRN

## 2012-06-29 MED ORDER — NITROGLYCERIN IN D5W 200-5 MCG/ML-% IV SOLN
2.0000 ug/min | INTRAVENOUS | Status: DC
  Administered 2012-06-29: 5 ug/min via INTRAVENOUS
  Filled 2012-06-29: qty 250

## 2012-06-29 MED ORDER — HEPARIN (PORCINE) IN NACL 2-0.9 UNIT/ML-% IJ SOLN
INTRAMUSCULAR | Status: AC
  Filled 2012-06-29: qty 2000

## 2012-06-29 MED ORDER — INSULIN ASPART 100 UNIT/ML ~~LOC~~ SOLN: 8.0000 [IU] | Freq: Three times a day (TID) | SUBCUTANEOUS | Status: DC

## 2012-06-29 MED ORDER — DEXTROSE 50 % IV SOLN: 25.0000 mL | INTRAVENOUS | Status: DC | PRN

## 2012-06-29 MED ORDER — LORAZEPAM 2 MG/ML IJ SOLN: 1.0000 mg | Freq: Once | INTRAMUSCULAR | Status: DC

## 2012-06-29 MED ORDER — NITROGLYCERIN IN D5W 200-5 MCG/ML-% IV SOLN
2.0000 ug/min | INTRAVENOUS | Status: DC
  Administered 2012-07-01: 5 ug/min via INTRAVENOUS
  Filled 2012-06-29: qty 250

## 2012-06-29 MED ORDER — HEPARIN SODIUM (PORCINE) 1000 UNIT/ML IJ SOLN
INTRAMUSCULAR | Status: AC
  Filled 2012-06-29: qty 1

## 2012-06-29 MED ORDER — HEPARIN (PORCINE) IN NACL 100-0.45 UNIT/ML-% IJ SOLN
1600.0000 [IU]/h | INTRAMUSCULAR | Status: DC
  Administered 2012-06-29: 1200 [IU]/h via INTRAVENOUS
  Administered 2012-06-30: 1600 [IU]/h via INTRAVENOUS
  Filled 2012-06-29 (×3): qty 250

## 2012-06-29 MED ORDER — LIDOCAINE HCL (PF) 1 % IJ SOLN
INTRAMUSCULAR | Status: AC
  Filled 2012-06-29: qty 30

## 2012-06-29 MED ORDER — LORAZEPAM 2 MG/ML IJ SOLN
INTRAMUSCULAR | Status: AC
  Filled 2012-06-29: qty 1

## 2012-06-29 MED ORDER — SODIUM CHLORIDE 0.9 % IV SOLN
INTRAVENOUS | Status: DC
  Administered 2012-06-29: 5.4 [IU]/h via INTRAVENOUS
  Filled 2012-06-29: qty 1

## 2012-06-29 NOTE — ED Notes (Signed)
PA at bedside.

## 2012-06-29 NOTE — ED Notes (Signed)
Pt. Given 1.5 L of NS prior to starting insulin. BG: >600. Per glucose stabilizer start insulin at 5.4 units/hr.

## 2012-06-29 NOTE — ED Notes (Signed)
Received critical labs -- Glucose 771; Troponin 17.12; admitting MD notified.

## 2012-06-29 NOTE — ED Notes (Signed)
Dr. Mikle Bosworth and Dr. Stanford Breed, Cardiology are at the bedside talking to the patient about possible cardiac catheterization. Patient still noted to be groggy but was able to arouse. Pt understood the benefits and risk of the procedure. Patient was asked about her immediate family members for the staff to contact. Patient gave her sister's number, [name]. Will contact the family to let them know about the patient.

## 2012-06-29 NOTE — ED Notes (Addendum)
Per EMS, patient began having chest pain and shortness of breath around 0200 this morning; chest pain substernal, radiates under left breast.  Initially pain 8/10; 4/10 after two nitro.  Upon EMS arrival, patient diaphoretic and tachypnic.  IV started in left AC (18 GA).  Patient has 324 ASA and 2 nitroglycerin.  Patient's CBG >500.  Patient states that she did not check her sugar yesterday and did not eat like she was supposed to (after taking insulin).  Depression noted in 12-lead; EMS EKG shown to Dr. Winfred Leeds.  Pain increases upon palpation to chest and deep inspiration; patient states that pain started suddenly this morning rather than gradually.  Patient reports using crack/cocaine last night.

## 2012-06-29 NOTE — ED Notes (Signed)
Patient stated that she is still having chest pain, NTG increased to 10 mcg/min. Will continue to monitor.

## 2012-06-29 NOTE — ED Provider Notes (Signed)
Medical screening examination/treatment/procedure(s) were conducted as a shared visit with non-physician practitioner(s) and myself.  I personally evaluated the patient during the encounter  Orlie Dakin, MD 06/29/12 [phone]

## 2012-06-29 NOTE — ED Notes (Signed)
Dr. Mikle Bosworth spoke to [name],  patient's sister, and informed her about the patient's condition and possible cardiac catheterization.

## 2012-06-29 NOTE — ED Notes (Signed)
[name] [name], patient's sister's number is [phone].

## 2012-06-29 NOTE — Progress Notes (Signed)
ANTICOAGULATION CONSULT NOTE - Follow Up Consult  Pharmacy Consult for heparin  Indication: chest pain/ACS  No Known Allergies  Patient Measurements: Height: 5\' 8"  (172.7 cm) Weight: 268 lb (121.564 kg) IBW/kg (Calculated) : 63.9  Heparin Dosing Weight: 92kg  Vital Signs: Temp: 98.5 F (36.9 C) (07/02 1300) Temp src: Oral (07/02 1300) BP: 128/94 mmHg (07/02 0847) Pulse Rate: 97  (07/02 1300)  Labs:  Basename 06/29/12 1030 06/29/12 0626  HGB -- 15.6*  HCT -- 45.0  PLT -- 292  APTT -- --  LABPROT 14.9 --  INR 1.15 --  HEPARINUNFRC -- --  CREATININE -- 1.12*  CKTOTAL -- --  CKMB -- --  TROPONINI -- 17.12*    Estimated Creatinine Clearance: 72.4 ml/min (by C-G formula based on Cr of 1.12).   Medications:  Heparin gtt at 1300 ml/hr prior to cath  Assessment: 61 year old female presenting to [facility] with chest pain. Patient is now s/p cath found to have severe 3 vessel obstructive coronary disease and an EF of 25% with inferior wall hypokinesis. No intervention was performed during cath. Noted orders to resume heparin tonight for medical management.   Goal of Therapy:  Heparin level 0.3-0.7 units/ml Monitor platelets by anticoagulation protocol: Yes   Plan:  1.Will resume heparin at 1200 units/hr 2.Daily HL/CBC 3.Follow cardiology plan  Georgina Peer 06/29/2012,1:04 PM

## 2012-06-29 NOTE — ED Provider Notes (Signed)
History     CSN: [id number]  Arrival date & time 06/29/12  0603   First MD Initiated Contact with Patient 06/29/12 [phone]      Chief Complaint  Patient presents with  . Chest Pain    (Consider location/radiation/quality/duration/timing/severity/associated sxs/prior treatment) HPI  PCP: Ephriam Knuckles, MD - [facility]  Patient had acute onset of chest pain that started around 10pm last night and stayed constant throughout the night. She admits to abusing crack cocaine with her last use being last night. She denies having a cardiac history but admits that she had a stroke. Per her notes, she had a Echo done in 2008 which showed no significant disease. The patient has a PMH of diabetes, hypertension, hyperlipidemia and stroke.   Her chest pain is currently mild at a 4/10 after 2 SL nitro and 324 baby aspirin which came down from a 10/10. The pain is located up towards her left shoulder and worsens with a short breath. The pain was a gradual onset over 4 hours. The patient is tachycardic and tachypnic. Her pain is pleuritic.    Past Medical History  Diagnosis Date  . Hypertension   . Diabetes mellitus   . Glaucoma   . Hyperlipidemia   . CVA (cerebral infarction)   . Left-sided sensory deficit present   . Cocaine abuse   . Tobacco abuse     History reviewed. No pertinent past surgical history.  Family History  Problem Relation Age of Onset  . Other      no known family CAD    History  Substance Use Topics  . Smoking status: Current Everyday Smoker -- 1.0 packs/day  . Smokeless tobacco: Not on file  . Alcohol Use: No    OB History    Grav Para Term Preterm Abortions TAB SAB Ect Mult Living                  Review of Systems   HEENT: denies blurry vision or change in hearing PULMONARY: Denies difficulty breathing  CARDIAC: denies heart palpitations MUSCULOSKELETAL:  denies being unable to ambulate ABDOMEN AL: denies abdominal pain GU: denies loss of bowel or  urinary control NEURO: denies numbness and tingling in extremities SKIN: no new rashes PSYCH: patient denies anxiety or depression. NECK: Pt denies having neck pain      Allergies  Review of patient's allergies indicates no known allergies.  Home Medications   Current Outpatient Rx  Name Route Sig Dispense Refill  . ASPIRIN 325 MG PO TABS Oral Take 325 mg by mouth daily.    Marland Kitchen BRINZOLAMIDE 1 % OP SUSP Both Eyes Place 1 drop into both eyes 3 (three) times daily.    Marland Kitchen CETIRIZINE HCL 10 MG PO TABS Oral Take 10 mg by mouth daily.    Marland Kitchen HYDROCHLOROTHIAZIDE 12.5 MG PO CAPS Oral Take 12.5 mg by mouth daily.    Marland Kitchen HYDROCODONE-ACETAMINOPHEN 5-500 MG PO TABS Oral Take 1 tablet by mouth every 6 (six) hours as needed. For pain    . LATANOPROST 0.005 % OP SOLN Both Eyes Place 1 drop into both eyes at bedtime.    Marland Kitchen LISINOPRIL 20 MG PO TABS Oral Take 20 mg by mouth daily.    Marland Kitchen METFORMIN HCL 1000 MG PO TABS Oral Take 1,000 mg by mouth 2 (two) times daily with a meal.    . PRAVASTATIN SODIUM 40 MG PO TABS Oral Take 40 mg by mouth daily.    . TRAMADOL HCL 50 MG  PO TABS Oral Take 50 mg by mouth every 6 (six) hours as needed. For pain    . ASPIRIN 81 MG PO CHEW Oral Chew 324 mg by mouth daily.    Marland Kitchen NITROGLYCERIN 0.4 MG SL SUBL Sublingual Place 0.4 mg under the tongue every 5 (five) minutes as needed. 2 doses for chest pain      BP 127/92  Pulse 105  Temp 97.9 F (36.6 C) (Oral)  Resp 16  Ht 5\' 8"  (1.727 m)  Wt 268 lb (121.564 kg)  BMI 40.75 kg/m2  SpO2 98%  Physical Exam  Nursing note and vitals reviewed. Constitutional: She appears well-developed and well-nourished. No distress.  HENT:  Head: Normocephalic and atraumatic.  Eyes: Pupils are equal, round, and reactive to light.  Neck: Normal range of motion. Neck supple.  Cardiovascular: Regular rhythm.        tachycardic  Pulmonary/Chest: Effort normal. No respiratory distress. She has no wheezes. She exhibits tenderness.  Abdominal:  Soft.  Neurological: She is alert.  Skin: Skin is warm and dry.    ED Course  Procedures (including critical care time)  Labs Reviewed  CBC - Abnormal; Notable for the following:    WBC 17.9 (*)     RBC 5.84 (*)     Hemoglobin 15.6 (*)     MCV 77.1 (*)     RDW 16.7 (*)     All other components within normal limits  BASIC METABOLIC PANEL - Abnormal; Notable for the following:    Sodium 128 (*)     Potassium 5.2 (*)     Chloride 91 (*)     CO2 18 (*)     Glucose, Bld 771 (*)     Creatinine, Ser 1.12 (*)     GFR calc non Af Amer 52 (*)     GFR calc Af Amer 60 (*)     All other components within normal limits  TROPONIN I - Abnormal; Notable for the following:    Troponin I 17.12 (*)     All other components within normal limits  HEPARIN LEVEL (UNFRACTIONATED)  CARDIAC PANEL(CRET KIN+CKTOT+MB+TROPI)  CARDIAC PANEL(CRET KIN+CKTOT+MB+TROPI)  URINE RAPID DRUG SCREEN (HOSP PERFORMED)   Dg Chest Port 1 View  06/29/2012  *RADIOLOGY REPORT*  Clinical Data: Chest pain since yesterday.  Shortness of breath. Smoker with history of hypertension and diabetes.  PORTABLE CHEST - 1 VIEW 06/29/2012 0648 hours:  Comparison: Two-view chest x-ray 03/22/2008, 08/30/1007, 01/04/1007.  Findings: Cardiac silhouette mildly enlarged, even allowing for the AP portable technique.  Interval development mild diffuse interstitial pulmonary edema.  No localized airspace consolidation. No visible pleural effusions.  IMPRESSION: Mild CHF, with mild cardiomegaly and mild diffuse interstitial pulmonary edema.  Original Report Authenticated By: Deniece Portela, M.D.     1. NSTEMI (non-ST elevated myocardial infarction)   2. Hyperglycemia       MDM    Date: 06/29/2012  Rate: 118  Rhythm: sinus tachycardia  QRS Axis: normal  Intervals: normal  ST/T Wave abnormalities: normal  Conduction Disutrbances:none  Narrative Interpretation: probable left atrial abnormality and probable ischemia with prolonged QT  interval  Old EKG Reviewed: changes noted from March 22, 2008.    Dr. Winfred Leeds has examined patient as well. He has spoken with cardiology with [place], we are to hospital admit after labs result  Patient has received 2mg  IV Ativan which has helped her pain but she is still having mild pain and continues to be tachycardic. Another 1mg   Ativan IV ordered at 6:46am.    7:14 am- Patient pain is 1/10. Troponin has resulted and is 17.25. Dr. Winfred Leeds notified.  Dr. Rhina Brackett has assumed care of the patient and ordered Heparin.    7:54 AM-  Sugar is in the 700's, anion gap is 19. Teaching service has been consulted. He has agreed to see patient and will discuss with cardiology who will admit.     Linus Mako, [place] 06/29/12 [phone]

## 2012-06-29 NOTE — H&P (Signed)
Hospital Admission Note Date: 06/29/2012  Patient name: [name] [name] Medical record number: [id number] Date of birth: 1951/06/22 Age: 61 y.o. Gender: female PCP: [facility]  Medical Service: Internal Medicine Teaching Service, [facility] Service  Attending physician:  Dr. Tarri Abernethy    1st Contact: Dr. Jule Ser  Pager: [phone] 2nd Contact: Dr. Guy Sandifer  Pager: [phone] After 5 pm or weekends: 1st Contact:      Pager: [phone] 2nd Contact:      Pager: [phone]  Chief Complaint: Chest pain  History of Present Illness: [name] [name] is a 61 year old woman with a history of diabetes, hypertension, hyperlipidemia, previous CVA, tobacco use, and cocaine abuse who presented to the [facility] ED complaining of chest pain.  At the time of my interview she was very somnolent from Ativan 1 mg IV x2 that she was given by the ED.  She states that the chest pain started this morning about 3 am (though she told the cardiology PA that it started yesterday evening at 8:30 pm) and is a substernal pressure that radiates to the left shoulder as well as to the right chest wall some.  She can not tell me any alleviating factors or aggravating factors.  She states that yesterday she was at a party and she did cocaine for most of the day with the last episode of use prior to this being in June 2013.  She states that she was a heavy crack cocaine addict until about 2008 and then she has been doing it only sporadically since then.  She states that she continues to have chest pain at this time even after 2 sublingual nitroglycerin, ASA, and a nitroglycerin drip that was started by the ED.    She also states that over the last week she has not been taking her medications as prescribed.  She brought most of her medications with her and all of the bottles were filled on 05/19/12 and have almost none of the medications taken out of them.  The only bottle that was empty was the Vicodin bottle.  She also told the pharmacy  representative that she takes insulin but we do not know what type or dose.   Meds: Current Outpatient Rx  Name Route Sig  . ASPIRIN 325 MG PO TABS Oral Take 325 mg by mouth daily.  Marland Kitchen BRINZOLAMIDE 1 % OP SUSP Both Eyes Place 1 drop into both eyes 3 (three) times daily.  Marland Kitchen CETIRIZINE HCL 10 MG PO TABS Oral Take 10 mg by mouth daily.  Marland Kitchen HYDROCHLOROTHIAZIDE 12.5 MG PO CAPS Oral Take 12.5 mg by mouth daily.  Marland Kitchen HYDROCODONE-ACETAMINOPHEN 5-500 MG PO TABS Oral Take 1 tablet by mouth every 6 (six) hours as needed. For pain  . LATANOPROST 0.005 % OP SOLN Both Eyes Place 1 drop into both eyes at bedtime.  Marland Kitchen LISINOPRIL 20 MG PO TABS Oral Take 20 mg by mouth daily.  Marland Kitchen METFORMIN HCL 1000 MG PO TABS Oral Take 1,000 mg by mouth 2 (two) times daily with a meal.  . PRAVASTATIN SODIUM 40 MG PO TABS Oral Take 40 mg by mouth daily.  . TRAMADOL HCL 50 MG PO TABS Oral Take 50 mg by mouth every 6 (six) hours as needed. For pain  . ASPIRIN 81 MG PO CHEW Oral Chew 324 mg by mouth daily.  Marland Kitchen NITROGLYCERIN 0.4 MG SL SUBL Sublingual Place 0.4 mg under the tongue every 5 (five) minutes as needed. 2 doses for chest pain   Allergies:  Allergies as of 06/29/2012  . (No Known Allergies)   Past Medical History  Diagnosis Date  . Hypertension   . Diabetes mellitus     diagnosed in 2008  . Glaucoma   . Hyperlipidemia   . CVA (cerebral infarction)     right internal capsule stroke in 12/2006  . Left-sided sensory deficit present   . Cocaine abuse     crack cocaine heavily until 2008 then sporatic use since then  . Tobacco abuse    History reviewed. No pertinent past surgical history. Family History  Problem Relation Age of Onset  . Other      no known family CAD   History   Social History  . Marital Status: Single    Spouse Name: N/A    Number of Children: N/A  . Years of Education: N/A   Occupational History  . Not on file.   Social History Main Topics  . Smoking status: Current Everyday Smoker -- 1.0  packs/day  . Smokeless tobacco: Not on file  . Alcohol Use: No  . Drug Use: Yes    Special: Cocaine  . Sexually Active:    Other Topics Concern  . Not on file   Social History Narrative  . No narrative on file   Review of Systems: Limited due to patient's somnolence but she denies SOB, diaphoresis, diarrhea, or constipation.    Physical Exam: Blood pressure 128/94, pulse 100, temperature 97.9 F (36.6 C), temperature source Oral, resp. rate 16, height 5\' 8"  (1.727 m), weight 268 lb (121.564 kg), SpO2 97.00%. Constitutional: Vital signs reviewed.  Patient is a well-developed and well-nourished obese woman in no acute distress and cooperative with exam. Alert and oriented x3.  She is falling asleep intermittently throughout the interview.  Head: Normocephalic and atraumatic Ear: TM normal bilaterally Mouth: no erythema or exudates, dry mucus membranes Eyes: PERRL, EOMI, conjunctivae normal, No scleral icterus.  Neck: Supple, Trachea midline normal ROM, No JVD, mass, thyromegaly, or carotid bruit present.  Cardiovascular: tachycardic but regular, S1 normal, S2 normal, no MRG, pulses symmetric and intact bilaterally Pulmonary/Chest: mild expiratory wheezing noted bilaterally, no rales, or rhonchi Abdominal: Soft. Non-tender, non-distended, bowel sounds are normal, no masses, organomegaly, or guarding present.  GU: no CVA tenderness Musculoskeletal: No joint deformities, erythema, or stiffness, ROM is full and nontender in the bilateral shoulder.  Hematology: no cervical, inginal, or axillary adenopathy.  Neurological: somnolent but arousable and A&O x3, Strength is normal and symmetric bilaterally, cranial nerve II-XII are grossly intact, no focal motor deficit, sensory intact to light touch bilaterally.  Skin: Warm, dry and intact. No rash, cyanosis, or clubbing.  Psychiatric: flat affect. speech is slowed but behavior is normal.   Lab results: Basic Metabolic Panel:  Basename  06/29/12 0626  NA 128*  K 5.2*  CL 91*  CO2 18*  GLUCOSE 771*  BUN 23  CREATININE 1.12*  CALCIUM 10.3  MG --  PHOS --   CBC:  Basename 06/29/12 0626  WBC 17.9*  NEUTROABS --  HGB 15.6*  HCT 45.0  MCV 77.1*  PLT 292   Cardiac Enzymes:  Basename 06/29/12 0626  CKTOTAL --  CKMB --  CKMBINDEX --  TROPONINI 17.12*   Urinalysis    Component Value Date/Time   COLORURINE YELLOW 06/29/2012 [place] 06/29/2012 0750   LABSPEC <1.005* 06/29/2012 0750   PHURINE 5.0 06/29/2012 0750   GLUCOSEU >1000* 06/29/2012 0750   HGBUR MODERATE* 06/29/2012 0750   BILIRUBINUR  NEGATIVE 06/29/2012 0750   KETONESUR NEGATIVE 06/29/2012 0750   PROTEINUR NEGATIVE 06/29/2012 0750   UROBILINOGEN 0.2 06/29/2012 0750   NITRITE NEGATIVE 06/29/2012 0750   LEUKOCYTESUR SMALL* 06/29/2012 0750   CBG:  Basename 06/29/12 0833  GLUCAP >600*   Urine Drug Screen: Drugs of Abuse     Component Value Date/Time   LABOPIA NONE DETECTED 06/29/2012 0750   COCAINSCRNUR POSITIVE* 06/29/2012 0750   LABBENZ NONE DETECTED 06/29/2012 0750   AMPHETMU NONE DETECTED 06/29/2012 0750   THCU NONE DETECTED 06/29/2012 0750   LABBARB NONE DETECTED 06/29/2012 0750    Imaging results:  Dg Chest Port 1 View  06/29/2012  *RADIOLOGY REPORT*  Clinical Data: Chest pain since yesterday.  Shortness of breath. Smoker with history of hypertension and diabetes.  PORTABLE CHEST - 1 VIEW 06/29/2012 0648 hours:  Comparison: Two-view chest x-ray 03/22/2008, 08/30/1007, 01/04/1007.  Findings: Cardiac silhouette mildly enlarged, even allowing for the AP portable technique.  Interval development mild diffuse interstitial pulmonary edema.  No localized airspace consolidation. No visible pleural effusions.  IMPRESSION: Mild CHF, with mild cardiomegaly and mild diffuse interstitial pulmonary edema.  Original Report Authenticated By: Deniece Portela, M.D.   Other results: EKG: sinus tachycardia, 1 mm ST elevation in V1 and V2 with T wave inversions noted  over the anterior leads on initial EKG.  On the repeat EKG after nitro and benzos the ST elevation has resolved but there are deep and increased T wave inversions in the anterior leads.  She has a prolonged QT interval, and left atrial enlargement.   Assessment & Plan by Problem: 1. Chest pain:  Ms. Levenstein presented with chest pain in the setting of cocaine use.  With her EKG changes, increased troponin, and continued chest pain this is likely a NSTEMI in the setting of cocaine use.  Other differential diagnosis includes coronary vasospasm from the cocaine use.  Cardiology was consulted by the ED and they recommended urgent catheterization but she also has markedly elevated glucose that needs to be corrected first.    - Admit to ICU, preferably 2900  - Nitroglycerin drip  - Heparin drip  - ASA (she has received today's dose already by EMS)  - Add Brilinta per Cardiology recommendations  - Hold beta blocker in setting of recent cocaine use  - continue Lisinopril and start high dose lipitor  - Hopefully cardiac cath later this evening or possibly tomorrow depending on how long it takes to correct her blood sugar.  2. Diabetes mellitus, uncontrolled: Ms. Moulton's blood sugar on admission was 770.  When asked she stated that her blood sugars usually run between 120-180 but on review of her blood glucose meter her last few measurements spaced over the last 30 days were all >250 with the highest being 457.  She states that she does take insulin but is unable to tell me the type or dose.  Urinalysis was negative for ketones but there was very little urine that was collected.  Her calculated serum osmolarity is 307 which is not quite high enough for HHS and her ABG is pending.  Her anion gap is 19 and she is hyponatremic, hypochloremic, and likely acidotic with her Bicarb of 18.  We will have to be careful with fluids since she has some fluid overload present on her chest x-ray that could represent flash  pulmonary edema secondary to the acute NSTEMI she is having.    - ABG, serum ketones, and serum Osm STAT  - Glucose stabilizer  protocol with IV insulin drip  - Bmet q2  - Normal saline at 75 ml/hr   3. Hypertension:  She has a history of hypertension and is on medications as an outpatient for this.  Currently she is normotensive and there is a question if she is taking her medications.  With her recent cocaine use beta blockers are contraindicated.  With the nitroglycerin drip she is getting we will hold her lisinopril and HCTZ as well at this time.  She will need continued monitoring and likely restart of her lisinopril first as she recovers from the NSTEMI.   4. Acute kidney injury:  Her creatinine is mildly elevated from her baseline which appears to be about 0.8-1.  She is currently at 1.12.  Given her markedly elevated blood glucose it is likely secondary to osmotic diuresis.  We will continue to monitor with fluid rehydration and correction of her hyperglycemia.   5. Polysubstance abuse:  She admits to cocaine use yesterday and has at least two other episodes of cocaine positive urine screens in the past.  She also is a daily smoker.  She will need to work to stop both of these habits in the setting of her NSTEMI.  We will have the tobacco cessation counselor as well as the social worker talk with her about help to stop both bad habits.    6.  Microcytosis: She has a mildly decreased MCV noted today on her CBC.  She is not anemic currently but is likely volume contracted from her hyperglycemia.  We will repeat the CBC after fluid rehydration.  If it continues to be low and she is anemic we will pursue the workup.  Otherwise this likely represents a beta thalassemia trait.   7. VTE: On therapeutic heparin and SCDs.   Signed: Jamel Holzmann 06/29/2012, 9:16 AM

## 2012-06-29 NOTE — Consult Note (Signed)
CARDIOLOGY CONSULT NOTE  Patient ID: [name] [name], MRN: [id number], DOB/AGE: Nov 29, 1951 61 y.o. Admit date: 06/29/2012 Date of Consult: 06/29/2012  Primary Physician: No primary provider on file. Primary Cardiologist: None  Chief Complaint: chest pain Reason for Consultation: chest pain  HPI: 61 y.o. female w/ PMHx significant for tobacco and cocaine abuse, HLD, HTN, DM, and CVA who presented to [facility] on 06/29/2012 with complaints of chest pain in the setting of cocaine use.  No prior cardiac history. Echo 2008 revealed nl LV systolic function, EF [id number]. She reports being a "hard crack addict" until 2008, but has occasionally done cocaine since that time. Yesterday did cocaine throughout the day starting around 5pm and developed chest pain around 8:30pm. It was left sided, "moved all over" her chest, described as a tightness, 10/10, associated with nausea, vomiting, sob, and diaphoresis. She called EMS who administered ASA and 2 SL NTG with moderate relief. 12 lead from EMS showed  ST elevation V1-2, inferolateral TWI. Was given Ativan 1mg  x2 in the ED. Is very somnolent, but reports 4/10 chest pain. She reports being "very active" and has never had chest pain before. Has not been taking all of her medications over the last week. Denies recent illness, fever, chills, sob, doe, edema, orthopnea, syncope.   EKG shows Sinus tachycardia 118bpm, lateral deep TWI, 49mm ST elevation V1, <84mm ST elevation V2. Troponin 17.12, K+ 5.2, Crt 1.12, WBC 17.9, glucose 771.  Past Medical History  Diagnosis Date  . Hypertension   . Diabetes mellitus   . Glaucoma   . Hyperlipidemia   . CVA (cerebral infarction)   . Left-sided sensory deficit present   . Cocaine abuse   . Tobacco abuse      2008 - 2D Echo SUMMARY - Overall left ventricular systolic function was normal. Left ventricular ejection fraction was estimated , range being 55 % to 65 %. This study was inadequate for the evaluation of left  ventricular regional wall motion. Left ventricular wall thickness was mildly to moderately increased. - The left atrium was mild to moderately dilated. IMPRESSIONS No likely, discrete, intracardiac source of emboli was apparent. The study would have severely limited sensitivity for such a source.   Surgical History: History reviewed. No pertinent past surgical history.   Home Meds: From patient med bottles Pravastatin 40mg  daily Lisinopril 20mg  daily HCTZ 12.5mg  daily ASA 325mg  daily Metformin 1000mg  BID  Inpatient Medications:     . heparin  4,000 Units Intravenous Once  . LORazepam  1 mg Intravenous Once  . LORazepam  1 mg Intravenous Once  . DISCONTD: LORazepam  1 mg Intravenous Once      . heparin    . nitroGLYCERIN 5 mcg/min (06/29/12 0723)    Allergies: No Known Allergies  History   Social History  . Marital Status: Single    Spouse Name: N/A    Number of Children: N/A  . Years of Education: N/A   Occupational History  . Not on file.   Social History Main Topics  . Smoking status: Current Everyday Smoker -- 1.0 packs/day  . Smokeless tobacco: Not on file  . Alcohol Use: No  . Drug Use: Yes    Special: Cocaine  . Sexually Active:    Other Topics Concern  . Not on file   Social History Narrative  . No narrative on file     Family history: No known heart disease  Review of Systems: General: negative for chills, fever, night  sweats or weight changes.  Cardiovascular: (+) chest pain, shortness of breath; negative for dyspnea on exertion, edema, orthopnea, palpitations, or paroxysmal nocturnal dyspnea Dermatological: negative for rash Respiratory: negative for cough or wheezing Urologic: negative for hematuria Abdominal: (+) nausea, vomiting; negative for diarrhea, bright red blood per rectum, melena, or hematemesis Neurologic: negative for visual changes, syncope, or dizziness All other systems reviewed and are otherwise negative except as noted  above.  Labs: CBC:    Component Value Date/Time   WBC 17.9* 06/29/2012 0626   HGB 15.6* 06/29/2012 0626   HCT 45.0 06/29/2012 0626   PLT 292 06/29/2012 0626   MCV 77.1* 06/29/2012 [id number]   Basic Metabolic Panel:    Component Value Date/Time   NA 128* 06/29/2012 0626   K 5.2* 06/29/2012 0626   CL 91* 06/29/2012 0626   CO2 18* 06/29/2012 0626   BUN 23 06/29/2012 0626   CREATININE 1.12* 06/29/2012 0626   GLUCOSE 771* 06/29/2012 0626   CALCIUM 10.3 06/29/2012 0626     06/29/2012 06:26  Troponin I 17.12 (HH)     Radiology/Studies:  06/29/12 - CXR  Findings: Cardiac silhouette mildly enlarged, even allowing for the AP portable technique. Interval development mild diffuse interstitial pulmonary edema. No localized airspace consolidation. No visible pleural effusions. IMPRESSION: Mild CHF, with mild cardiomegaly and mild diffuse interstitial pulmonary edema.    EKG: 06/29/12 @ 0614 - Sinus tachycardia 118bpm, lateral deep TWI, 36mm ST elevation V1, <46mm ST elevation V2  06/29/12 @ 0801 - sinus rhythm 88bpm, inferolateral TWI  Physical Exam: Blood pressure 129/91, pulse 107, temperature 97.9 F (36.6 C), temperature source Oral, resp. rate 16, height 5\' 8"  (1.727 m), weight 268 lb (121.564 kg), SpO2 96.00%. General: Overweight black female, in no acute distress. Head: Normocephalic, atraumatic, sclera non-icteric, no xanthomas, nares are without discharge.  Neck: Supple. JVD not elevated. Lungs: Expiratory wheezes; No rales or rhonchi. Breathing is unlabored. Heart: RRR with S1 S2. No murmurs, rubs, or gallops appreciated. Abdomen: Obese, soft, non-tender, non-distended with normoactive bowel sounds. No hepatomegaly. No rebound/guarding. No obvious abdominal masses. Msk:  Strength and tone appear normal for age. Extremities: No clubbing or cyanosis. Trace BLE edema.  Distal pedal pulses are intact and equal bilaterally. Neuro: Somnolent and oriented X 3. Moves all extremities spontaneously. Psych:  Responds to  questions slowly with a flat affect.   Assessment and Plan:  61 y.o. female w/ PMHx significant for tobacco and cocaine abuse, HLD, HTN, DM, and CVA who presented to [facility] on 06/29/2012 with complaints of chest pain in the setting of cocaine use.  1. Chest Pain: Patient reports sudden onset chest pain last night while doing cocaine. She also has multiple cardiac risk factors. EKG shows inferolateral deep TWI, 53mm ST elevation V1 which is markedly different than prior EKGs. Troponin 17.12. Vital signs stable. Patient has been given ASA and benzos already. Her chest pain is likely from coronary vasospasm in the setting of cocaine abuse. Avoid beta blockers. Start heparin and nitro drips. If chest pain continues may need cardiac cath. Cont ASA and statin  2. Cocaine abuse: Pt reports cocaine use yesterday. Will check UDS.  3. HTN: Stable. Cont antihypertensives. Monitor with IV NTG  4. HLD: Cont statin  5. DM: Glucose 771. Hold metformin given possible need for cath. Will need insulin given glucose 771. Management per primary team  6. Tobacco abuse:  Discussed need for cessation. Will need further tobacco cessation counseling.  Signed, HOPE, JESSICA PA-C  06/29/2012, 7:29 AM   As above, patient seen and examined. 61 year old female with past medical history of diabetes, hypertension, hyperlipidemia, prior CVA, substance abuse with chest pain. Patient apparently was doing cocaine yesterday afternoon. Her history is somewhat difficult as she was given Ativan in the emergency room. However she states her pain began at approximately 9:30 PM last evening. It radiated down her arms bilaterally. She presented to the emergency room with continuing pain. Her electrocardiogram shows sinus tachycardia with approximately 1 mm of ST elevation in V1 and V2 with T wave inversion in V3 through V6 and in the inferior leads. There is left atrial enlargement. There is a prolonged QT interval. Patient has  been treated with aspirin, heparin, nitroglycerin, and benzodiazepines. She continues to have some pain but followup electrocardiogram shows resolution of ST elevation in V1 and V2. There is persistent and worsening anterior T-wave inversion. Review of her laboratories shows a sodium of 128 and a glucose of 771. Her CO2 is 18 and her anion gap is 19. Given persistent pain I would like to proceed with urgent cardiac catheterization although she has a drug screen positive for cocaine and certainly this could represent vasospasm. However her glucose is markedly elevated and she most likely has diabetic ketoacidosis. Plan to begin to treat electrolyte abnormalities. Internal medicine will administer IV insulin and followup potassium. We will continue with aspirin, heparin and nitroglycerin. I will add Brilinta and statin. No beta blocker given cocaine use. She may require cardiac catheterization later today or early in the morning if she stabilizes. I discussed the risk and benefits and the patient agrees to proceed. The risks and benefits were also discussed with her sister by the internal medicine physicians. Patient will need drug rehabilitation once she stabilizes from her presenting issues. She will also need further evaluation of microcytic anemia. Add Protonix. Kirk Ruths

## 2012-06-29 NOTE — ED Notes (Signed)
Main Lab called and need more blood. Call back [phone]

## 2012-06-29 NOTE — ED Provider Notes (Addendum)
Seen on arrival Complains of anterior chest pain onset possibly 8 PM yesterday pain is pleuritic accompanied by shortness of breath. Patient admits to smoking crack cocaine from 7 PM until midnight last night Treated by EMS with 2 sublingual nitroglycerin and baby aspirin with partial relief. On exam alert nontoxic HEENT exam edentulous mucous membranes dry neck supple lungs clear auscultation heart tachycardic regular rhythm. Abdomen obese nontender. 6:40 AM pain improved after treatment with Ativan. Continues to have pain, additional Ativan ordered. Spoke with Dr. Stanford Breed who will evaluate patient in the emergency department. Request medical service to admit patient Chest xray reviewed by me CRITICAL CARE Performed by: Orlie Dakin   Total critical care time: 30 minute  Critical care time was exclusive of separately billable procedures and treating other patients.  Critical care was necessary to treat or prevent imminent or life-threatening deterioration.  Critical care was time spent personally by me on the following activities: development of treatment plan with patient and/or surrogate as well as nursing, discussions with consultants, evaluation of patient's response to treatment, examination of patient, obtaining history from patient or surrogate, ordering and performing treatments and interventions, ordering and review of laboratory studies, ordering and review of radiographic studies, pulse oximetry and re-evaluation of patient's condition.  Orlie Dakin, MD 06/29/12 [id number]  Orlie Dakin, MD 06/29/12 [phone]

## 2012-06-29 NOTE — CV Procedure (Signed)
   Cardiac Catheterization Procedure Note  Name: [name] [name] MRN: [id number] DOB: September 14, 1951  Procedure: Left Heart Cath, Selective Coronary Angiography, LV angiography  Indication: 62 year old white female with history of morbid obesity and diabetes who presents with a non-ST elevation myocardial infarction with ongoing chest pain. Patient is in DKA. She has a history of cocaine abuse.   Procedural Details: The right wrist was prepped, draped, and anesthetized with 1% lidocaine. Using the modified Seldinger technique, a 5 French sheath was introduced into the right radial artery. 3 mg of verapamil was administered through the sheath, weight-based unfractionated heparin was administered intravenously. Standard Judkins catheters were used for selective coronary angiography and left ventriculography. Catheter exchanges were performed over an exchange length guidewire. There were no immediate procedural complications. A TR band was used for radial hemostasis at the completion of the procedure.  The patient was transferred to the post catheterization recovery area for further monitoring.  Procedural Findings: Hemodynamics: AO 130/84 with a mean of 103 mmHg LV 126/31 mmHg  Coronary angiography: Coronary dominance left  Left mainstem: The left main coronary has mild irregularities less than 10%.  Left anterior descending (LAD): There is an 80% focal stenosis in the proximal LAD. There is moderate disease immediately after the takeoff of the first septal perforator at 50-70%. In the mid LAD in a tortuous segment there is a segmental 80% stenosis. The distal LAD is small in caliber and appears to be diffusely diseased.  Left circumflex (LCx): The left circumflex is a dominant vessel. The first obtuse marginal vessel bifurcates into 2 moderate-sized branches. These branches are diffusely diseased up to 30%. In the mid circumflex there is a focal 80% ulcerated stenosis. The distal circumflex is without  significant disease.  Right coronary artery (RCA): The right coronary is a nondominant vessel. It has 40-50% diffuse disease in the proximal vessel.  Left ventriculography: Left ventricular systolic function is markedly abnormal. There is anterior lateral and apical akinesis. The inferior wall is severely hypokinetic. Overall ejection fraction is estimated at 25%.   Final Conclusions:  1. Severe 3 vessel obstructive coronary disease. 2. Severe left ventricular dysfunction.  Recommendations: At this point we'll continue aggressive medical therapy. This patient's multiple metabolic problems need to be corrected. At that point we can decide about potential revascularization options.  Collier Salina [facility] 06/29/2012, 11:32 AM

## 2012-06-29 NOTE — ED Notes (Addendum)
Dr. Jacubowitz at bedside 

## 2012-06-29 NOTE — Progress Notes (Signed)
CRITICAL VALUE ALERT  Critical value received:  CKMB and Troponin  Date of notification:  06/29/2012  Time of notification:  [id number]  Critical value read back:yes  Nurse who received alert:  Cindra Presume, RN  MD notified (1st page):  Dr. Obie Dredge already aware.  Time of first page:   MD notified (2nd page):  Time of second page:  Responding MD:  Time MD responded:

## 2012-06-29 NOTE — H&P (Signed)
Internal Medicine Teaching Service Attending Note Date: 06/29/2012  Patient name: [name] [name]  Medical record number: [id number]  Date of birth: Feb 03, 1951   I have seen and evaluated [name] [name] and discussed their care with the Residency Team.  50 yr. Old AAF w/ hx HL, HTN, IDDM type 2 (? No consistent history with regards to insulin use), hx CVA, crack cocaine use, presented with chest pain. She states she got together with some friends and did cocaine most of late last night. Admitted to pressure like CP and SOB w/ pain radiating to left shoulder.  She admits to not taking medications as prescribed. She was noted to have continued CP in ED with V3-V6 T wave inversion as well as 12mm ST elevation in V1 and V2.  She was placed on NTG gtt and heparin gtt and underwent cardiac cath revealing three vessel disease. She has evidence of NSTEMI with positive Trop.  She has evidence of AG metabolic acidosis with hyperglycemia, hyperkalemia, hyponatremia, hypochloremia. She is pre-renal by laboratory studies. CXR with evidence of early pulmonary edema, likely secondary to AMI, but in setting of hypovolemic state. She was started on insulin gtt with improving BG readings. Elevated Cr in setting of AMI and pre-renal state. Currently at time of my exam, she is CP free.  Physical Exam: Blood pressure 120/85, pulse 95, temperature 98.6 F (37 C), temperature source Oral, resp. rate 27, height 5\' 8"  (1.727 m), weight 115.9 kg (255 lb 8.2 oz), SpO2 99.00%. BP 120/85  Pulse 95  Temp 98.6 F (37 C) (Oral)  Resp 27  Ht 5\' 8"  (1.727 m)  Wt 115.9 kg (255 lb 8.2 oz)  BMI 38.85 kg/m2  SpO2 99% General appearance: alert, cooperative and appears stated age Head: Normocephalic, without obvious abnormality, atraumatic Eyes: conjunctivae/corneas clear. PERRL, EOM's intact. Fundi benign. Neck: no adenopathy, no carotid bruit, no JVD, supple, symmetrical, trachea midline and thyroid not enlarged, symmetric, no  tenderness/mass/nodules Heart: regular rate and rhythm, S1, S2 normal, no murmur, click, rub or gallop Pulmonary: Bilateral basilar crackles. Abdomen: soft, non-tender; bowel sounds normal; no masses,  no organomegaly Extremities: extremities normal, atraumatic, no cyanosis or edema Pulses: 2+ and symmetric Skin: Skin color, texture, turgor normal. No rashes or lesions Neurologic: Alert and oriented X 3, normal strength and tone. Normal symmetric reflexes. Normal coordination and gait Cranial nerves: normal  Lab results: Results for orders placed during the hospital encounter of 06/29/12 (from the past 24 hour(s))  CBC     Status: Abnormal   Collection Time   06/29/12  6:26 AM      Component Value Range   WBC 17.9 (*) 4.0 - 10.5 K/uL   RBC 5.84 (*) 3.87 - 5.11 MIL/uL   Hemoglobin 15.6 (*) 12.0 - 15.0 g/dL   HCT 45.0  36.0 - 46.0 %   MCV 77.1 (*) 78.0 - 100.0 fL   MCH 26.7  26.0 - 34.0 pg   MCHC 34.7  30.0 - 36.0 g/dL   RDW 16.7 (*) 11.5 - 15.5 %   Platelets 292  150 - 400 K/uL  BASIC METABOLIC PANEL     Status: Abnormal   Collection Time   06/29/12  6:26 AM      Component Value Range   Sodium 128 (*) 135 - 145 mEq/L   Potassium 5.2 (*) 3.5 - 5.1 mEq/L   Chloride 91 (*) 96 - 112 mEq/L   CO2 18 (*) 19 - 32 mEq/L   Glucose, Bld  771 (*) 70 - 99 mg/dL   BUN 23  6 - 23 mg/dL   Creatinine, Ser 1.12 (*) 0.50 - 1.10 mg/dL   Calcium 10.3  8.4 - 10.5 mg/dL   GFR calc non Af Amer 52 (*) >90 mL/min   GFR calc Af Amer 60 (*) >90 mL/min  TROPONIN I     Status: Abnormal   Collection Time   06/29/12  6:26 AM      Component Value Range   Troponin I 17.12 (*) <0.30 ng/mL  URINE RAPID DRUG SCREEN (HOSP PERFORMED)     Status: Abnormal   Collection Time   06/29/12  7:50 AM      Component Value Range   Opiates NONE DETECTED  NONE DETECTED   Cocaine POSITIVE (*) NONE DETECTED   Benzodiazepines NONE DETECTED  NONE DETECTED   Amphetamines NONE DETECTED  NONE DETECTED   Tetrahydrocannabinol NONE  DETECTED  NONE DETECTED   Barbiturates NONE DETECTED  NONE DETECTED  URINALYSIS, ROUTINE W REFLEX MICROSCOPIC     Status: Abnormal   Collection Time   06/29/12  7:50 AM      Component Value Range   Color, Urine YELLOW  YELLOW   APPearance CLEAR  CLEAR   Specific Gravity, Urine <1.005 (*) 1.005 - 1.030   pH 5.0  5.0 - 8.0   Glucose, UA >1000 (*) NEGATIVE mg/dL   Hgb urine dipstick MODERATE (*) NEGATIVE   Bilirubin Urine NEGATIVE  NEGATIVE   Ketones, ur NEGATIVE  NEGATIVE mg/dL   Protein, ur NEGATIVE  NEGATIVE mg/dL   Urobilinogen, UA 0.2  0.0 - 1.0 mg/dL   Nitrite NEGATIVE  NEGATIVE   Leukocytes, UA SMALL (*) NEGATIVE  URINE MICROSCOPIC-ADD ON     Status: Normal   Collection Time   06/29/12  7:50 AM      Component Value Range   Squamous Epithelial / LPF RARE  RARE   WBC, UA 3-6  <3 WBC/hpf   RBC / HPF 7-10  <3 RBC/hpf   Bacteria, UA RARE  RARE   Urine-Other LESS THAN 10 mL OF URINE SUBMITTED    GLUCOSE, CAPILLARY     Status: Abnormal   Collection Time   06/29/12  8:33 AM      Component Value Range   Glucose-Capillary >600 (*) 70 - 99 mg/dL   Comment 1 Documented in Chart     Comment 2 Notify RN    GLUCOSE, CAPILLARY     Status: Abnormal   Collection Time   06/29/12  9:26 AM      Component Value Range   Glucose-Capillary >600 (*) 70 - 99 mg/dL  PROTIME-INR     Status: Normal   Collection Time   06/29/12 10:30 AM      Component Value Range   Prothrombin Time 14.9  11.6 - 15.2 seconds   INR 1.15  0.00 - 1.49  ETHANOL     Status: Normal   Collection Time   06/29/12 10:30 AM      Component Value Range   Alcohol, Ethyl (B) <11  0 - 11 mg/dL  OSMOLALITY     Status: Abnormal   Collection Time   06/29/12 10:30 AM      Component Value Range   Osmolality 312 (*) 275 - 300 mOsm/kg  KETONES, QUALITATIVE     Status: Normal   Collection Time   06/29/12 10:30 AM      Component Value Range   Acetone, Bld NEGATIVE  NEGATIVE  GLUCOSE, CAPILLARY     Status: Abnormal   Collection Time    06/29/12 11:50 AM      Component Value Range   Glucose-Capillary 481 (*) 70 - 99 mg/dL  MRSA PCR SCREENING     Status: Normal   Collection Time   06/29/12 12:26 PM      Component Value Range   MRSA by PCR NEGATIVE  NEGATIVE  POCT I-STAT 3, BLOOD GAS (G3+)     Status: Abnormal   Collection Time   06/29/12 12:51 PM      Component Value Range   pH, Arterial 7.408 (*) 7.350 - 7.400   pCO2 arterial 35.5  35.0 - 45.0 mmHg   pO2, Arterial 70.0 (*) 80.0 - 100.0 mmHg   Bicarbonate 22.4  20.0 - 24.0 mEq/L   TCO2 24  0 - 100 mmol/L   O2 Saturation 94.0     Acid-base deficit 2.0  0.0 - 2.0 mmol/L   Patient temperature 97.9 F     Collection site RADIAL, ALLEN'S TEST ACCEPTABLE     Drawn by RT     Sample type ARTERIAL    CARDIAC PANEL(CRET KIN+CKTOT+MB+TROPI)     Status: Abnormal   Collection Time   06/29/12  1:05 PM      Component Value Range   Total CK 2441 (*) 7 - 177 U/L   CK, MB 228.4 (*) 0.3 - 4.0 ng/mL   Troponin I >25.00 (*) <0.30 ng/mL   Relative Index 9.4 (*) 0.0 - 2.5  PRO B NATRIURETIC PEPTIDE     Status: Abnormal   Collection Time   06/29/12  1:05 PM      Component Value Range   Pro B Natriuretic peptide (BNP) 12777.0 (*) 0 - 125 pg/mL  BASIC METABOLIC PANEL     Status: Abnormal   Collection Time   06/29/12  1:05 PM      Component Value Range   Sodium 131 (*) 135 - 145 mEq/L   Potassium 4.3  3.5 - 5.1 mEq/L   Chloride 96  96 - 112 mEq/L   CO2 20  19 - 32 mEq/L   Glucose, Bld 421 (*) 70 - 99 mg/dL   BUN 22  6 - 23 mg/dL   Creatinine, Ser 1.03  0.50 - 1.10 mg/dL   Calcium 10.4  8.4 - 10.5 mg/dL   GFR calc non Af Amer 57 (*) >90 mL/min   GFR calc Af Amer 67 (*) >90 mL/min  APTT     Status: Abnormal   Collection Time   06/29/12  2:29 PM      Component Value Range   aPTT 38 (*) 24 - 37 seconds  BASIC METABOLIC PANEL     Status: Abnormal   Collection Time   06/29/12  2:45 PM      Component Value Range   Sodium 133 (*) 135 - 145 mEq/L   Potassium 4.2  3.5 - 5.1 mEq/L   Chloride  97  96 - 112 mEq/L   CO2 20  19 - 32 mEq/L   Glucose, Bld 339 (*) 70 - 99 mg/dL   BUN 22  6 - 23 mg/dL   Creatinine, Ser 1.02  0.50 - 1.10 mg/dL   Calcium 10.4  8.4 - 10.5 mg/dL   GFR calc non Af Amer 58 (*) >90 mL/min   GFR calc Af Amer 67 (*) >90 mL/min    Imaging results:  Dg Chest Port 1 View  06/29/2012  *RADIOLOGY  REPORT*  Clinical Data: Chest pain since yesterday.  Shortness of breath. Smoker with history of hypertension and diabetes.  PORTABLE CHEST - 1 VIEW 06/29/2012 0648 hours:  Comparison: Two-view chest x-ray 03/22/2008, 08/30/1007, 01/04/1007.  Findings: Cardiac silhouette mildly enlarged, even allowing for the AP portable technique.  Interval development mild diffuse interstitial pulmonary edema.  No localized airspace consolidation. No visible pleural effusions.  IMPRESSION: Mild CHF, with mild cardiomegaly and mild diffuse interstitial pulmonary edema.  Original Report Authenticated By: Deniece Portela, M.D.    Assessment and Plan: I agree with the formulated Assessment and Plan with the following changes: Given history of crack cocaine use, EKG changes and positive troponins with ongoing CP she was taken for cardiac catherization. She has 80% focal stenosis in proximal LAD, moderate disease in first septal perforator at about 50%-70%, mid LAD with segmental 80% stenosis, LAD with diffuse disease, diffusely diseased obtuse marginals, 80% mid circumflex stenosis w/ ulceration, RCA w/ 40-50% proximal disease, and she was noted to have EF of 25%. She has a pending HbA1C, suspect this will be indicative of poor control. Given rapidly improving Glucose, would discontinue insulin gtt after 2 hours of being on Lantus 30 Units (start low dose basal given her weight) and advance diet to diabetic with insulin coverage with meals. Change IVF to D51/2NS w/ 20 meq KCl at about 75 cc/hr for another 6 hours with careful attention to respiratory status given early findings of pulmonary edema and  crackles in setting of reduced EF and AMI. Check BG every 2 hours for next 24 hrs. Will change BMP's to every 6 hours. Given cocaine use hx and likely component of vasospasm would continue NTG gtt at low rate as long as MAP is over 65. Her AMI may have been caused by vasospasm in setting of three vessel CAD and stopping at this time may induce spasm again.  Her ABG does not show evidence of acidosis and her bicarb is not significantly decreased in addition to no ketones in urine, no evidence of DKA. Careful monitor overnight in ICU. Complex admission requiring over 55 minutes of critical care time.   Dominic Pea, DO 7/2/20134:30 PM

## 2012-06-29 NOTE — Progress Notes (Signed)
ANTICOAGULATION CONSULT NOTE - Initial Consult  Pharmacy Consult for Heparin Indication: chest pain/ACS  No Known Allergies  Patient Measurements: Height: 5\' 8"  (172.7 cm) Weight: 268 lb (121.564 kg) IBW/kg (Calculated) : 63.9  Heparin Dosing Weight: 92 kg  Vital Signs: Temp: 97.7 F (36.5 C) (07/02 0614) Temp src: Oral (07/02 0614) BP: 137/95 mmHg (07/02 0647) Pulse Rate: 111  (07/02 0647)  Labs:  Flo Shanks 06/29/12 0626  HGB 15.6*  HCT 45.0  PLT 292  APTT --  LABPROT --  INR --  HEPARINUNFRC --  CREATININE 1.12*  CKTOTAL --  CKMB --  TROPONINI --    Estimated Creatinine Clearance: 72.4 ml/min (by C-G formula based on Cr of 1.12).   Medical History: Past Medical History  Diagnosis Date  . Hypertension   . Diabetes mellitus   . Glaucoma   . Hyperlipidemia   . CVA (cerebral infarction)   . Left-sided sensory deficit present   . Cocaine abuse   . Tobacco abuse     Medications:  See med rec  Assessment: 61 y.o. female presents with chest pain. To begin heparin for r/o ACS.  Goal of Therapy:  Heparin level 0.3-0.7 units/ml Monitor platelets by anticoagulation protocol: Yes   Plan:  1. Heparin 4000 units IV bolus 2. Heparin gtt at 1300 units/hr 3. F/u 6 hr heparin level 4. Daily heparin level and CBC  Sherlon Handing, PharmD, BCPS Clinical pharmacist, pager [phone] 06/29/2012,7:09 AM

## 2012-06-29 NOTE — ED Notes (Signed)
J. Hope PAC was made aware of patient's elevated glucose and was instructed to talk to the ED provider. Burr Medico PAC made aware that patient's glucose is 771. Will monitor.

## 2012-06-29 NOTE — Care Management Note (Addendum)
    Page 1 of 2   07/15/2012     3:35:55 PM   CARE MANAGEMENT NOTE 07/15/2012  Patient:  [name] [name], [name] [name]   Account Number:  [id number]  Date Initiated:  06/29/2012  Documentation initiated by:  Elissa Hefty  Subjective/Objective Assessment:   adm w mili     Action/Plan:   lives alone   Anticipated DC Date:  07/09/2012   Anticipated DC Plan:  [place]  CM consult      [facility] Choice  HOME HEALTH   Choice offered to / List presented to:  C-1 Patient   DME arranged  [place]      DME agency  [place] arranged  HH-1 RN      [place].   Status of service:  Completed, signed off Medicare Important Message given?   (If response is "NO", the following Medicare IM given date fields will be blank) Date Medicare IM given:   Date Additional Medicare IM given:    Discharge Disposition:  [place]  Per UR Regulation:  Reviewed for med. necessity/level of care/duration of stay  If discussed at [place] of Stay Meetings, dates discussed:    Comments:  07/15/12 [place] [id number] [facility]; UNABLE TO ARRANGE SKILLED CARE FOR PT.  MET WITH PT, CSW, AND 3 FAMILY MEMBERS TO DISCUSS BACKUP PLAN.  PT'S AUNT AND SISTER RELUCTANTLY STATE THAT THEY WILL BE ABLE TO ASSIST WITH 24HR CARE.  WILL NOTIFY [facility] OF PT DISCHARGE TODAY.  FAMILY MEMBER TO DELIVER DME TO PT'S HOME.  CSW TO ASSIST WITH AMBULANCE TRANPORT HOME, AS SHE IS NOT SURE SHE CAN TRANSPORT HOME VIA AUTO.  07/15/12 [name] 1150 NOTIFIED BY BEDSIDE RN THAT PT AND VISITING FAMILY MEMBER ARE STATING THAT PT WANTS TO NOW GO TO SNF.  SHE HAS A DISCHARGE ORDER FOR TODAY.  WILL NOTIFY CSW TO SEE IF THIS IS EVEN POSSIBLE.  PT HAD VERBALIZED TO ME THAT SHE DID NOT WANT TO GO TO SNF, AND THAT SHE HAD 24 HR CARE AT HOME. SHE NOW STATES THAT SHE DOES NOT HAVE  CARE AS STATED.  WILL FOLLOW UP.  07/13/12 [name],RN,BSN 1200 PT S/P CABG; LIKELY DC HOME TOMORROW WITH BOYFRIEND [name] [name].  PT HAS PERSONAL CARE SERVICES WORKER WHO WILL BE INCREASING HRS TO 6HS/DAY, PER PT.  WILL ARRANGE HHRN AND ORDER NEEDED DME.  PT PREFERS [facility].  START OF CARE 24-48H POST DC DATE.  07/05/12  1540  CRYSTAL SIMMONS RN, BSN [phone] AWAITING CABG PER TCTS; HEPARIN GTT CONTINUES; NCM WILL FOLLOW.  7/2 15:17p [name] dowell rn,bsn [id number]

## 2012-06-30 DIAGNOSIS — I2510 Atherosclerotic heart disease of native coronary artery without angina pectoris: Secondary | ICD-10-CM

## 2012-06-30 DIAGNOSIS — I219 Acute myocardial infarction, unspecified: Secondary | ICD-10-CM

## 2012-06-30 LAB — CBC
HCT: 39.3 % (ref 36.0–46.0)
Hemoglobin: 13.2 g/dL (ref 12.0–15.0)
MCH: 25.9 pg — ABNORMAL LOW (ref 26.0–34.0)
MCHC: 33.6 g/dL (ref 30.0–36.0)
MCV: 77.2 fL — ABNORMAL LOW (ref 78.0–100.0)
Platelets: 240 K/uL (ref 150–400)
RBC: 5.09 MIL/uL (ref 3.87–5.11)
RDW: 16.7 % — ABNORMAL HIGH (ref 11.5–15.5)
WBC: 14.4 K/uL — ABNORMAL HIGH (ref 4.0–10.5)

## 2012-06-30 LAB — BASIC METABOLIC PANEL
BUN: 23 mg/dL (ref 6–23)
BUN: 28 mg/dL — ABNORMAL HIGH (ref 6–23)
CO2: 20 mEq/L (ref 19–32)
Chloride: 98 mEq/L (ref 96–112)
Chloride: 100 mEq/L (ref 96–112)
Chloride: 99 mEq/L (ref 96–112)
Chloride: 96 mEq/L (ref 96–112)
Creatinine, Ser: 1.07 mg/dL (ref 0.50–1.10)
Creatinine, Ser: 1.31 mg/dL — ABNORMAL HIGH (ref 0.50–1.10)
GFR calc Af Amer: 71 mL/min — ABNORMAL LOW (ref >90)
GFR calc Af Amer: 64 mL/min — ABNORMAL LOW (ref >90)
GFR calc Af Amer: 61 mL/min — ABNORMAL LOW (ref >90)
GFR calc non Af Amer: 61 mL/min — ABNORMAL LOW (ref >90)
GFR calc non Af Amer: 52 mL/min — ABNORMAL LOW (ref >90)
Glucose, Bld: 408 mg/dL — ABNORMAL HIGH (ref 70–99)
Potassium: 4.4 mEq/L (ref 3.5–5.1)
Potassium: 4 mEq/L (ref 3.5–5.1)
Potassium: 4.1 mEq/L (ref 3.5–5.1)
Sodium: 135 mEq/L (ref 135–145)
Sodium: 134 mEq/L — ABNORMAL LOW (ref 135–145)
Sodium: 133 mEq/L — ABNORMAL LOW (ref 135–145)

## 2012-06-30 LAB — LIPID PANEL
HDL: 25 mg/dL — ABNORMAL LOW (ref >39)
LDL Cholesterol: 154 mg/dL — ABNORMAL HIGH (ref 0–99)
Triglycerides: 227 mg/dL — ABNORMAL HIGH (ref <150)

## 2012-06-30 LAB — HEMOGLOBIN A1C: Hgb A1c MFr Bld: 10.5 % — ABNORMAL HIGH (ref <5.7)

## 2012-06-30 LAB — HEPARIN LEVEL (UNFRACTIONATED)
Heparin Unfractionated: <0.1 [IU]/mL — ABNORMAL LOW (ref 0.30–0.70)
Heparin Unfractionated: 0.68 IU/mL (ref 0.30–0.70)

## 2012-06-30 MED ORDER — INSULIN ASPART 100 UNIT/ML ~~LOC~~ SOLN: 8.0000 [IU] | Freq: Three times a day (TID) | SUBCUTANEOUS | Status: DC

## 2012-06-30 MED ORDER — INSULIN GLARGINE 100 UNIT/ML ~~LOC~~ SOLN
40.0000 [IU] | Freq: Every day | SUBCUTANEOUS | Status: DC
  Administered 2012-07-01: 40 [IU] via SUBCUTANEOUS

## 2012-06-30 MED ORDER — INSULIN ASPART 100 UNIT/ML ~~LOC~~ SOLN: 0.0000 [IU] | Freq: Three times a day (TID) | SUBCUTANEOUS | Status: DC

## 2012-06-30 MED ORDER — INSULIN REGULAR HUMAN 100 UNIT/ML IJ SOLN
10.0000 [IU] | Freq: Once | INTRAMUSCULAR | Status: AC
  Administered 2012-06-30: 10 [IU] via SUBCUTANEOUS
  Filled 2012-06-30: qty 0.1

## 2012-06-30 MED ORDER — HEPARIN (PORCINE) IN NACL 100-0.45 UNIT/ML-% IJ SOLN
1650.0000 [IU]/h | INTRAMUSCULAR | Status: DC
  Administered 2012-07-01: 1250 [IU]/h via INTRAVENOUS
  Administered 2012-07-02: 1450 [IU]/h via INTRAVENOUS
  Administered 2012-07-02: 1250 [IU]/h via INTRAVENOUS
  Administered 2012-07-03 – 2012-07-06 (×3): 1450 [IU]/h via INTRAVENOUS
  Administered 2012-07-08 (×2): 1650 [IU]/h via INTRAVENOUS
  Filled 2012-06-30 (×17): qty 250

## 2012-06-30 MED ORDER — INSULIN GLARGINE 100 UNIT/ML ~~LOC~~ SOLN: 30.0000 [IU] | Freq: Every day | SUBCUTANEOUS | Status: DC

## 2012-06-30 MED ORDER — INSULIN ASPART 100 UNIT/ML ~~LOC~~ SOLN: 6.0000 [IU] | Freq: Three times a day (TID) | SUBCUTANEOUS | Status: DC

## 2012-06-30 MED ORDER — INSULIN ASPART 100 UNIT/ML ~~LOC~~ SOLN: 10.0000 [IU] | Freq: Once | SUBCUTANEOUS | Status: DC

## 2012-06-30 MED ORDER — FLUTICASONE-SALMETEROL 100-50 MCG/DOSE IN AEPB
1.0000 | INHALATION_SPRAY | Freq: Two times a day (BID) | RESPIRATORY_TRACT | Status: DC
  Administered 2012-06-30 – 2012-07-01 (×2): 1 via RESPIRATORY_TRACT
  Filled 2012-06-30: qty 14

## 2012-06-30 MED ORDER — INSULIN ASPART 100 UNIT/ML ~~LOC~~ SOLN
0.0000 [IU] | Freq: Three times a day (TID) | SUBCUTANEOUS | Status: DC
  Administered 2012-06-30: 15 [IU] via SUBCUTANEOUS
  Administered 2012-06-30: 10 [IU] via SUBCUTANEOUS
  Administered 2012-07-01: 7 [IU] via SUBCUTANEOUS
  Administered 2012-07-01: 11 [IU] via SUBCUTANEOUS

## 2012-06-30 MED ORDER — CARVEDILOL 3.125 MG PO TABS
3.1250 mg | ORAL_TABLET | Freq: Two times a day (BID) | ORAL | Status: DC
  Administered 2012-06-30 – 2012-07-01 (×3): 3.125 mg via ORAL
  Filled 2012-06-30 (×5): qty 1

## 2012-06-30 MED ORDER — LISINOPRIL 2.5 MG PO TABS
2.5000 mg | ORAL_TABLET | Freq: Every day | ORAL | Status: DC
  Administered 2012-06-30 – 2012-07-08 (×9): 2.5 mg via ORAL
  Filled 2012-06-30 (×10): qty 1

## 2012-06-30 MED ORDER — INSULIN ASPART 100 UNIT/ML ~~LOC~~ SOLN
0.0000 [IU] | Freq: Every day | SUBCUTANEOUS | Status: DC
  Administered 2012-06-30: 2 [IU] via SUBCUTANEOUS

## 2012-06-30 NOTE — Progress Notes (Signed)
@   Subjective:  Mild chest soreness but no symptoms similar to infarct pain; no dyspnea.   Objective:  Filed Vitals:   06/30/12 0400 06/30/12 0438 06/30/12 0500 06/30/12 0600  BP: 118/76  132/79 125/73  Pulse: 100  94 92  Temp: 99.9 F (37.7 C)     TempSrc: Oral     Resp: 22  23 20   Height:      Weight:  118.2 kg (260 lb 9.3 oz)    SpO2: 97%  98% 98%    Intake/Output from previous day:  Intake/Output Summary (Last 24 hours) at 06/30/12 0724 Last data filed at 06/30/12 0600  Gross per 24 hour  Intake 1223.7 ml  Output    605 ml  Net  618.7 ml    Physical Exam: Physical exam: Well-developed obese in no acute distress.  Skin is warm and dry.  HEENT is normal.  Neck is supple.  Chest is clear to auscultation with normal expansion.  Cardiovascular exam is regular rate and rhythm.  Abdominal exam nontender or distended. No masses palpated. Extremities show no edema. Radial cath site with no hematoma neuro grossly intact    Lab Results: Basic Metabolic Panel:  [facility] 06/30/12 0432 06/29/12 2342  NA 134* 135  K 4.0 4.4  CL 100 98  CO2 24 21  GLUCOSE 317* 272*  BUN 23 23  CREATININE 1.07 0.98  CALCIUM 9.4 9.8  MG -- --  PHOS -- --   CBC:  Basename 06/30/12 0507 06/29/12 0626  WBC 14.4* 17.9*  NEUTROABS -- --  HGB 13.2 15.6*  HCT 39.3 45.0  MCV 77.2* 77.1*  PLT 240 292   Cardiac Enzymes:  Basename 06/29/12 2022 06/29/12 1305 06/29/12 0626  CKTOTAL 1973* 2441* --  CKMB 137.8* 228.4* --  CKMBINDEX -- -- --  TROPONINI >20.00* >25.00* 17.12*     Assessment/Plan:  1) S/P MI - Cath reveals 3 vessel CAD and severe LV dysfunction. Drug screen positive for cocaine. Plan continue ASA, heparin, NTG, and statin; add low dose ACEI. Given recent MI and severe LV dysfunction, will add low dose coreg despite cocaine as I feel benefit outweighs risk. CVTS consult for consideration of CABG. 2) Ischemic CM - add ACEI and coreg as outlined. 3) CAD - As above 4) DM  - Management per IM 5) Substance abuse - needs rehab once medical issues addressed. 6) Noncompliance - stressed need for medication compliance. 7) HTN - titrate meds as needed 8) Hyperlipidemia - continue statin 9) Microcytosis - guaiac stool; continue protonix; GI eval after she recovers from cardiac issues.  Kirk Ruths 06/30/2012, 7:24 AM

## 2012-06-30 NOTE — Progress Notes (Signed)
ANTICOAGULATION CONSULT NOTE - Follow Up Consult  Pharmacy Consult for heparin  Indication: chest pain/ACS  No Known Allergies  Patient Measurements: Height: 5\' 8"  (172.7 cm) Weight: 260 lb 9.3 oz (118.2 kg) IBW/kg (Calculated) : 63.9  Heparin Dosing Weight: 92kg  Vital Signs: Temp: 99.9 F (37.7 C) (07/03 0400) Temp src: Oral (07/03 0400) BP: 125/73 mmHg (07/03 0600) Pulse Rate: 92  (07/03 0600)  Labs:  Basename 06/30/12 0507 06/30/12 0432 06/29/12 2342 06/29/12 2022 06/29/12 1445 06/29/12 1429 06/29/12 1305 06/29/12 1030 06/29/12 0626  HGB 13.2 -- -- -- -- -- -- -- 15.6*  HCT 39.3 -- -- -- -- -- -- -- 45.0  PLT 240 -- -- -- -- -- -- -- 292  APTT -- -- -- -- -- 38* -- -- --  LABPROT -- -- -- -- -- -- -- 14.9 --  INR -- -- -- -- -- -- -- 1.15 --  HEPARINUNFRC <0.10* -- -- -- -- -- -- -- --  CREATININE -- 1.07 0.98 -- 1.02 -- -- -- --  CKTOTAL -- -- -- 1973* -- -- 2441* -- --  CKMB -- -- -- 137.8* -- -- 228.4* -- --  TROPONINI -- -- -- >20.00* -- -- >25.00* -- 17.12*    Estimated Creatinine Clearance: 74.6 ml/min (by C-G formula based on Cr of 1.07).   Medications:  Heparin gtt at 1200 ml/hr  Assessment: 61 year old female with CAD s/p cath, awaiting possible PCI, for Heparin  Goal of Therapy:  Heparin level 0.3-0.7 units/ml Monitor platelets by anticoagulation protocol: Yes   Plan:  Increase Heparin 1600 units/hr Check heparin level in 8 hours.  Caryl Pina 06/30/2012,7:05 AM

## 2012-06-30 NOTE — Progress Notes (Signed)
Patient ID: [name] [name], female   DOB: [birth year], 61 y.o.   MRN: [id number]  Resident Co-sign Daily Note: I have seen the patient and reviewed the daily progress note by [name] [name] MS4 and discussed the care of the patient with them.  See below for documentation of my findings, assessment, and plans.  Subjective: No acute events overnight. Patient states that she is feeling well with minimal residual chest discomfort. No sob or other symptoms. Objective: Vital signs in last 24 hours: Filed Vitals:   06/30/12 0600 06/30/12 0700 06/30/12 0800 06/30/12 0900  BP: 125/73 118/72 122/74 109/73  Pulse: 92 89 88 94  Temp:   99.1 F (37.3 C)   TempSrc:      Resp: 20 25 21 26   Height:      Weight:      SpO2: 98% 98% 98% 99%   Physical Exam: BP 109/73  Pulse 94  Temp 99.1 F (37.3 C) (Oral)  Resp 26  Ht 5\' 8"  (1.727 m)  Wt 118.2 kg (260 lb 9.3 oz)  BMI 39.62 kg/m2  SpO2 99%  General Appearance:    Alert, cooperative, no distress, appears stated age  Head:    Normocephalic, without obvious abnormality, atraumatic  Lungs:     Clear to auscultation bilaterally, respirations unlabored  Chest Wall:    No tenderness or deformity   Heart:    Regular rate and rhythm, S1 and S2 normal, no murmur, rub   or gallop   Lab Results: Reviewed and documented in Electronic Record Micro Results: Reviewed and documented in Electronic Record Studies/Results: Reviewed and documented in Electronic Record Medications: I have reviewed the patient's current medications. Scheduled Meds:   . aspirin EC  325 mg Oral Daily  . atorvastatin  80 mg Oral q1800  . brinzolamide  1 drop Both Eyes TID  . carvedilol  3.125 mg Oral BID WC  . insulin aspart  0-9 Units Subcutaneous TID WC  . insulin aspart  6 Units Subcutaneous TID WC  . insulin glargine  30 Units Subcutaneous Daily  . latanoprost  1 drop Both Eyes QHS  . lisinopril  2.5 mg Oral Daily  . pantoprazole  40 mg Oral Q0600   Continuous  Infusions:    . sodium chloride 10 mL/hr (06/29/12 2212)  . heparin 1,600 Units/hr (06/30/12 0738)  . nitroGLYCERIN 15 mcg/min (06/29/12 1900)   PRN Meds:.acetaminophen, dextrose, LORazepam, morphine injection, nitroGLYCERIN, ondansetron (ZOFRAN) IV   Assessment/Plan:  [name] [name] is a 61 y/o woman with NSTEMI in setting of cocaine use in addition to resolving HHS  1. NSTEMI- Chest pain is stable. Currently on heparin and nitroglycerin drip along with aspirin. Cardiac cath performed yesterday showed severe three vessel disease and EF of 25%. Cards will consult Cardiothoracic surgery to discuss possible CABG.  -Continue heparin and nitro drip  - Cards to consult CT surgery  2. Diabetes mellitus type 2 with ketoacidosis (HHS) - Anion gap has closed. Insulin drip was stopped and patient was placed on basal insulin, prandial insulin, and SSI coverage. From history, patient has not been compliant with her medications. Will discuss outpatient regiment further. On diabetic diet.  -Insulin drip stopped, on 30 U of Lantus, 6 U of prandial insulin before each meal, and SSI  -Will discuss outpatient Diabetes medication regiment  -Diabetic diet  3. Acute Kidney Injury- Resolving. Cr has returned to normal after fluid replacement. GFR reveals some CKD. Will need to be followed as an outpatient.  3.  Hypertension- On Nitro drip, Cards has added Coreg and Lisinopril.  4. Hyperlipidemia- Non compliant on medications as stated above. Will switch to Lipitor and encourage medication compliance.  5. RBC microcytosis- Hgb is normal. Cards has ordered stool guiac. Will continue to follow.  6. Cocaine abuse- Will educate patient on harms of cocaine abuse.  7. Prophy.- SCD's and IV heparin  8. Dispo.- Awaiting CT surgery and Cards rec's   LOS: 1 day   [name] [name] T 06/30/2012, 10:15 AM

## 2012-06-30 NOTE — Progress Notes (Signed)
Internal Medicine Teaching Service Attending Note Date: 06/30/2012  Patient name: [name] [name]  Medical record number: [id number]  Date of birth: 08-02-1951    This patient has been seen and discussed with the house staff. Please see their note for complete details. I concur with their findings with the following additions/corrections: Please refer to my full progress note for changes and additions.  Dominic Pea 06/30/2012, 1:32 PM

## 2012-06-30 NOTE — Progress Notes (Signed)
ANTICOAGULATION CONSULT NOTE - Follow Up Consult  Pharmacy Consult for heparin  Indication: chest pain/ACS  No Known Allergies  Patient Measurements: Height: 5\' 8"  (172.7 cm) Weight: 260 lb 9.3 oz (118.2 kg) IBW/kg (Calculated) : 63.9  Heparin Dosing Weight: 92kg  Vital Signs: Temp: 98.1 F (36.7 C) (07/03 1200) Temp src: Oral (07/03 0400) BP: 120/73 mmHg (07/03 1300) Pulse Rate: 87  (07/03 1400)  Labs:  Basename 06/30/12 1401 06/30/12 1000 06/30/12 0507 06/30/12 0432 06/29/12 2342 06/29/12 2022 06/29/12 1429 06/29/12 1305 06/29/12 1030 06/29/12 0626  HGB -- -- 13.2 -- -- -- -- -- -- 15.6*  HCT -- -- 39.3 -- -- -- -- -- -- 45.0  PLT -- -- 240 -- -- -- -- -- -- 292  APTT -- -- -- -- -- -- 38* -- -- --  LABPROT -- -- -- -- -- -- -- -- 14.9 --  INR -- -- -- -- -- -- -- -- 1.15 --  HEPARINUNFRC 0.68 -- <0.10* -- -- -- -- -- -- --  CREATININE -- 1.11* -- 1.07 0.98 -- -- -- -- --  CKTOTAL -- -- -- -- -- 1973* -- 2441* -- --  CKMB -- -- -- -- -- 137.8* -- 228.4* -- --  TROPONINI -- -- -- -- -- >20.00* -- >25.00* -- 17.12*    Estimated Creatinine Clearance: 71.9 ml/min (by C-G formula based on Cr of 1.11).   Medications:  Heparin gtt at 1600 ml/hr  Assessment: 61 year old female with CAD s/p cath, awaiting possible PCI, for Heparin  Follow up heparin level at upper end of goal. No bleeding issues noted. Will reduce rate to 1500/hr.  Goal of Therapy:  Heparin level 0.3-0.7 units/ml Monitor platelets by anticoagulation protocol: Yes   Plan:  Increase Heparin 1500 units/hr Daily HL /cbc  Georgina Peer 06/30/2012,3:00 PM

## 2012-06-30 NOTE — Progress Notes (Signed)
Inpatient Diabetes Program Recommendations  AACE/ADA: New Consensus Statement on Inpatient Glycemic Control (2009)  Target Ranges:  Prepandial:   less than 140 mg/dL      Peak postprandial:   less than 180 mg/dL (1-2 hours)      Critically ill patients:  140 - 180 mg/dL    Inpatient Diabetes Program Recommendations Insulin - IV drip/GlucoStabilizer: Recommend continuing insulin gtt  Use ICU Hyperglycemia protocol  Thank you  Raoul Pitch [facility] Inpatient Diabetes Coordinator [phone]

## 2012-06-30 NOTE — Progress Notes (Signed)
1 Day Post-Op Procedure(s) (LRB): LEFT HEART CATHETERIZATION WITH CORONARY ANGIOGRAM (N/A) Subjective: Severe ant MI aftern inhaling cocaine 3 days ago,also in DKA with cbg 700 LAD and circ are graftable vessels, needs 2D echo Now w/o angina  PMH- obesity BMI 40,L side CVA 5 years ago Objective: Vital signs in last 24 hours: Temp:  [98.1 F (36.7 C)-99.9 F (37.7 C)] 98.5 F (36.9 C) (07/03 1900) Pulse Rate:  [80-109] 84  (07/03 2000) Cardiac Rhythm:  [-] Normal sinus rhythm (07/03 1945) Resp:  [16-31] 22  (07/03 2000) BP: (90-132)/(56-104) 96/56 mmHg (07/03 2000) SpO2:  [94 %-100 %] 100 % (07/03 2000) Weight:  [260 lb 9.3 oz (118.2 kg)] 260 lb 9.3 oz (118.2 kg) (07/03 0438)  Hemodynamic parameters for last 24 hours:   NSRIntake/Output from previous day: 07/02 0701 - 07/03 0700 In: 1223.7 [P.O.:300; I.V.:923.7] Out: 605 [Urine:605] Intake/Output this shift:    EXAM Lungs clear extrem warm, pulses intact No carotid bruit Lab Results:  Basename 06/30/12 0507 06/29/12 0626  WBC 14.4* 17.9*  HGB 13.2 15.6*  HCT 39.3 45.0  PLT 240 292   BMET:  Basename 06/30/12 1402 06/30/12 1000  NA 130* 133*  K 4.1 4.1  CL 96 99  CO2 20 23  GLUCOSE 472* 408*  BUN 28* 24*  CREATININE 1.31* 1.11*  CALCIUM 9.4 9.3    PT/INR:  Basename 06/29/12 1030  LABPROT 14.9  INR 1.15   ABG    Component Value Date/Time   PHART 7.408* 06/29/2012 1251   HCO3 22.4 06/29/2012 1251   TCO2 24 06/29/2012 1251   ACIDBASEDEF 2.0 06/29/2012 1251   O2SAT 94.0 06/29/2012 1251   CBG (last 3)   Basename 06/30/12 1658 06/30/12 1330 06/30/12 1207  GLUCAP 304* 407* 421*    Assessment/Plan: S/P Procedure(s) (LRB): LEFT HEART CATHETERIZATION WITH CORONARY ANGIOGRAM (N/A) Plan review of echo, dopplers Needs to recover from MI, better DM control prob CABG next week- d/w patient   LOS: 1 day    [name],[name] [name] 06/30/2012

## 2012-06-30 NOTE — Progress Notes (Signed)
Subjective: Felt well overnight. No CP, no SOB. Noted this afternoon to have uncontrolled BG. Denies PND, orthopnea. Discussed condition with patient and answered all of her questions. She had no complaints for me today.  Objective: Vital signs in last 24 hours: Temp:  [98.1 F (36.7 C)-99.9 F (37.7 C)] 98.1 F (36.7 C) (07/03 1200) Pulse Rate:  [88-109] 91  (07/03 1200) Resp:  [16-31] 19  (07/03 1200) BP: (103-132)/(69-104) 112/78 mmHg (07/03 1200) SpO2:  [94 %-100 %] 100 % (07/03 1200) Weight:  [118.2 kg (260 lb 9.3 oz)] 118.2 kg (260 lb 9.3 oz) (07/03 0438) Weight change: -5.664 kg (-12 lb 7.8 oz) Last BM Date: 06/28/12  Intake/Output from previous day: 07/02 0701 - 07/03 0700 In: 1223.7 [P.O.:300; I.V.:923.7] Out: 605 [Urine:605] Intake/Output this shift: Total I/O In: 150.5 [P.O.:120; I.V.:30.5] Out: 150 [Urine:150]  General appearance: alert, cooperative and no distress Head: Normocephalic, without obvious abnormality, atraumatic Nose: Nares normal. Septum midline. Mucosa normal. No drainage or sinus tenderness. Throat: lips, mucosa, and tongue normal; teeth and gums normal Resp: clear to auscultation bilaterally Cardio: regular rate and rhythm, S1, S2 normal, no murmur, click, rub or gallop GI: soft, non-tender; bowel sounds normal; no masses,  no organomegaly Extremities: trace bilat pitting edema, no c/c. Pulses: 2+ and symmetric  Lab Results:  Basename 06/30/12 0507 06/29/12 0626  WBC 14.4* 17.9*  HGB 13.2 15.6*  HCT 39.3 45.0  PLT 240 292   BMET  Basename 06/30/12 1000 06/30/12 0432  NA 133* 134*  K 4.1 4.0  CL 99 100  CO2 23 24  GLUCOSE 408* 317*  BUN 24* 23  CREATININE 1.11* 1.07  CALCIUM 9.3 9.4    Studies/Results: Dg Chest Port 1 View  06/29/2012  *RADIOLOGY REPORT*  Clinical Data: Chest pain since yesterday.  Shortness of breath. Smoker with history of hypertension and diabetes.  PORTABLE CHEST - 1 VIEW 06/29/2012 0648 hours:  Comparison:  Two-view chest x-ray 03/22/2008, 08/30/1007, 01/04/1007.  Findings: Cardiac silhouette mildly enlarged, even allowing for the AP portable technique.  Interval development mild diffuse interstitial pulmonary edema.  No localized airspace consolidation. No visible pleural effusions.  IMPRESSION: Mild CHF, with mild cardiomegaly and mild diffuse interstitial pulmonary edema.  Original Report Authenticated By: Deniece Portela, M.D.    Medications:  I have reviewed the patient's current medications. Prior to Admission:  Prescriptions prior to admission  Medication Sig Dispense Refill  . aspirin 325 MG tablet Take 325 mg by mouth daily.      . brinzolamide (AZOPT) 1 % ophthalmic suspension Place 1 drop into both eyes 3 (three) times daily.      . cetirizine (ZYRTEC) 10 MG tablet Take 10 mg by mouth daily.      . hydrochlorothiazide (MICROZIDE) 12.5 MG capsule Take 12.5 mg by mouth daily.      Marland Kitchen HYDROcodone-acetaminophen (VICODIN) 5-500 MG per tablet Take 1 tablet by mouth every 6 (six) hours as needed. For pain      . latanoprost (XALATAN) 0.005 % ophthalmic solution Place 1 drop into both eyes at bedtime.      Marland Kitchen lisinopril (PRINIVIL,ZESTRIL) 20 MG tablet Take 20 mg by mouth 2 (two) times daily.      . metFORMIN (GLUCOPHAGE) 500 MG tablet Take 1,000 mg by mouth 2 (two) times daily with a meal.      . pravastatin (PRAVACHOL) 40 MG tablet Take 40 mg by mouth at bedtime.      . traMADol (ULTRAM) 50 MG tablet  Take 50 mg by mouth 3 (three) times daily.       Scheduled:   . aspirin EC  325 mg Oral Daily  . atorvastatin  80 mg Oral q1800  . brinzolamide  1 drop Both Eyes TID  . carvedilol  3.125 mg Oral BID WC  . insulin regular  10 Units Subcutaneous Once  . latanoprost  1 drop Both Eyes QHS  . lisinopril  2.5 mg Oral Daily  . pantoprazole  40 mg Oral Q0600  . pneumococcal 23 valent vaccine  0.5 mL Intramuscular Tomorrow-1000  . DISCONTD: aspirin  324 mg Oral NOW  . DISCONTD: aspirin  300 mg  Rectal NOW  . DISCONTD: insulin aspart  0-15 Units Subcutaneous TID WC  . DISCONTD: insulin aspart  0-9 Units Subcutaneous TID WC  . DISCONTD: insulin aspart  0-9 Units Subcutaneous TID WC  . DISCONTD: insulin aspart  6 Units Subcutaneous TID WC  . DISCONTD: insulin aspart  8 Units Subcutaneous TID WC  . DISCONTD: insulin glargine  30 Units Subcutaneous Daily  . DISCONTD: insulin regular  0-10 Units Intravenous TID WC   Continuous:   . sodium chloride 10 mL/hr (06/29/12 2212)  . heparin 1,600 Units/hr (06/30/12 1208)  . nitroGLYCERIN 15 mcg/min (06/29/12 1900)  . DISCONTD: sodium chloride 50 mL/hr at 06/29/12 1600  . DISCONTD: dextrose 5 % and 0.45 % NaCl with KCl 20 mEq/L 75 mL/hr at 06/29/12 1900  . DISCONTD: dextrose 5 % and 0.45% NaCl    . DISCONTD: dextrose 5 % and 0.45% NaCl 10 mL/hr at 06/29/12 1852  . DISCONTD: insulin (NOVOLIN-R) infusion 5.4 Units/hr (06/29/12 1632)  . DISCONTD: insulin (NOVOLIN-R) infusion 4.4 Units/hr (06/29/12 1834)  . DISCONTD: nitroGLYCERIN 15 mcg/min (06/29/12 1600)   [id number], dextrose, LORazepam, morphine injection, nitroGLYCERIN, ondansetron (ZOFRAN) IV, DISCONTD: acetaminophen, DISCONTD: ondansetron (ZOFRAN) IV  Assessment/Plan: 85 yr. Old AAF w/ hx NIDDM type 2, cocaine use, HTN, hx CVA, HL, presented with NSTEMI and uncontrolled DM. 1) s/p Acute NSTEMI and ISCM EF 25%: Coreg added as well as low dose lisinopril. CP free and hemodynamically stable. CT surgery to be consulted for consideration of CABG. 2) IDDM type 2: Lantus increase to 40 units given poor control and sliding scale coverage, increased due to morbid obesity. Slowly increase to obtain better coverage. HbA1C of over 10%. 3) Hyperlipidemia: LDL not at goal. Lipitor 80 mg po daily. 4) Microcytosis: stool occult blood, iron panel. No anemia noted, but she was hemoconcentrated on admission. Follow H/H. 5) Proph: on heparin gtt. 6) Code: FULL   LOS: 1 day   Dominic Pea 06/30/2012, 1:09 PM

## 2012-07-01 ENCOUNTER — Inpatient Hospital Stay (HOSPITAL_COMMUNITY): Payer: Medicaid Other

## 2012-07-01 LAB — BASIC METABOLIC PANEL
BUN: 26 mg/dL — ABNORMAL HIGH (ref 6–23)
CO2: 24 mEq/L (ref 19–32)
CO2: 26 mEq/L (ref 19–32)
Calcium: 9.2 mg/dL (ref 8.4–10.5)
Chloride: 103 mEq/L (ref 96–112)
Creatinine, Ser: 1.22 mg/dL — ABNORMAL HIGH (ref 0.50–1.10)
Glucose, Bld: 154 mg/dL — ABNORMAL HIGH (ref 70–99)
Glucose, Bld: 159 mg/dL — ABNORMAL HIGH (ref 70–99)
Glucose, Bld: 275 mg/dL — ABNORMAL HIGH (ref 70–99)
Potassium: 3.7 mEq/L (ref 3.5–5.1)
Potassium: 3.9 mEq/L (ref 3.5–5.1)
Sodium: 136 mEq/L (ref 135–145)

## 2012-07-01 LAB — GLUCOSE, CAPILLARY
Glucose-Capillary: 269 mg/dL — ABNORMAL HIGH (ref 70–99)
Glucose-Capillary: 202 mg/dL — ABNORMAL HIGH (ref 70–99)
Glucose-Capillary: 204 mg/dL — ABNORMAL HIGH (ref 70–99)

## 2012-07-01 LAB — IRON AND TIBC: TIBC: 230 ug/dL — ABNORMAL LOW (ref 250–470)

## 2012-07-01 LAB — CBC
HCT: 37.8 % (ref 36.0–46.0)
Hemoglobin: 12.9 g/dL (ref 12.0–15.0)
MCHC: 34.1 g/dL (ref 30.0–36.0)
WBC: 11.4 10*3/uL — ABNORMAL HIGH (ref 4.0–10.5)

## 2012-07-01 LAB — FERRITIN: Ferritin: 266 ng/mL (ref 10–291)

## 2012-07-01 LAB — TSH: TSH: 1.707 u[IU]/mL (ref 0.350–4.500)

## 2012-07-01 LAB — HEPARIN LEVEL (UNFRACTIONATED): Heparin Unfractionated: 0.9 IU/mL — ABNORMAL HIGH (ref 0.30–0.70)

## 2012-07-01 MED ORDER — CARVEDILOL 6.25 MG PO TABS
6.2500 mg | ORAL_TABLET | Freq: Two times a day (BID) | ORAL | Status: DC
  Administered 2012-07-01 – 2012-07-08 (×13): 6.25 mg via ORAL
  Filled 2012-07-01 (×18): qty 1

## 2012-07-01 MED ORDER — INSULIN GLARGINE 100 UNIT/ML ~~LOC~~ SOLN
45.0000 [IU] | Freq: Every day | SUBCUTANEOUS | Status: DC
  Administered 2012-07-02 – 2012-07-03 (×2): 45 [IU] via SUBCUTANEOUS

## 2012-07-01 MED ORDER — INSULIN ASPART 100 UNIT/ML ~~LOC~~ SOLN
4.0000 [IU] | Freq: Three times a day (TID) | SUBCUTANEOUS | Status: DC
  Administered 2012-07-01 – 2012-07-02 (×3): 4 [IU] via SUBCUTANEOUS

## 2012-07-01 MED ORDER — INSULIN ASPART 100 UNIT/ML ~~LOC~~ SOLN
0.0000 [IU] | Freq: Three times a day (TID) | SUBCUTANEOUS | Status: DC
  Administered 2012-07-01: 7 [IU] via SUBCUTANEOUS
  Administered 2012-07-02: 4 [IU] via SUBCUTANEOUS
  Administered 2012-07-02: 3 [IU] via SUBCUTANEOUS

## 2012-07-01 MED ORDER — INSULIN ASPART 100 UNIT/ML ~~LOC~~ SOLN
0.0000 [IU] | Freq: Every day | SUBCUTANEOUS | Status: DC
  Administered 2012-07-01: 2 [IU] via SUBCUTANEOUS

## 2012-07-01 NOTE — Progress Notes (Signed)
@   Subjective:  No Chest pain or dyspnea.   Objective:  Filed Vitals:   07/01/12 0203 07/01/12 0300 07/01/12 0500 07/01/12 0600  BP: 116/66 110/87 106/62 114/70  Pulse: 80 77 77 75  Temp:      TempSrc:      Resp: 19 22    Height:      Weight:      SpO2: 99% 98% 97% 98%    Intake/Output from previous day:  Intake/Output Summary (Last 24 hours) at 07/01/12 0732 Last data filed at 07/01/12 0600  Gross per 24 hour  Intake 1045.5 ml  Output    751 ml  Net  294.5 ml    Physical Exam: Physical exam: Well-developed obese in no acute distress.  Skin is warm and dry.  HEENT is normal.  Neck is supple.  Chest is clear to auscultation with normal expansion.  Cardiovascular exam is regular rate and rhythm.  Abdominal exam nontender or distended. No masses palpated. Extremities show no edema. Radial cath site with no hematoma neuro grossly intact    Lab Results: Basic Metabolic Panel:  Basename 07/01/12 0455 06/30/12 2336  NA 139 137  K 3.7 3.9  CL 103 100  CO2 24 26  GLUCOSE 154* 159*  BUN 26* 30*  CREATININE 1.05 1.22*  CALCIUM 9.0 9.2  MG -- --  PHOS -- --   CBC:  Basename 07/01/12 0455 06/30/12 0507  WBC 11.4* 14.4*  NEUTROABS -- --  HGB 12.9 13.2  HCT 37.8 39.3  MCV 77.5* 77.2*  PLT 228 240   Cardiac Enzymes:  Basename 06/29/12 2022 06/29/12 1305 06/29/12 0626  CKTOTAL 1973* 2441* --  CKMB 137.8* 228.4* --  CKMBINDEX -- -- --  TROPONINI >20.00* >25.00* 17.12*     Assessment/Plan:  1) S/P MI - Cath reveals 3 vessel CAD and severe LV dysfunction. Drug screen positive for cocaine. Plan continue ASA, heparin, NTG, statin and ACEI. Increase coreg to 6.25 mg po BID. For CABG next week. 2) Ischemic CM - continue ACEI and coreg as outlined. 3) CAD - As above 4) DM - Management per IM 5) Substance abuse - needs rehab once medical issues addressed. 6) Noncompliance - stressed need for medication compliance. 7) HTN - titrate meds as needed 8)  Hyperlipidemia - continue statin 9) Microcytosis - guaiac stool; continue protonix; GI eval after she recovers from cardiac issues.  Kirk Ruths 07/01/2012, 7:32 AM

## 2012-07-01 NOTE — Progress Notes (Signed)
ANTICOAGULATION CONSULT NOTE - Follow Up Consult  Pharmacy Consult for heparin  Indication: chest pain/ACS  No Known Allergies  Patient Measurements: Height: 5\' 8"  (172.7 cm) Weight: 260 lb 9.3 oz (118.2 kg) IBW/kg (Calculated) : 63.9  Heparin Dosing Weight: 92kg  Vital Signs: Temp: 98.5 F (36.9 C) (07/04 0001) Temp src: Oral (07/04 0001) BP: 110/87 mmHg (07/04 0300) Pulse Rate: 77  (07/04 0300)  Labs:  Basename 07/01/12 0455 06/30/12 2336 06/30/12 1402 06/30/12 1401 06/30/12 1000 06/30/12 0507 06/29/12 2022 06/29/12 1429 06/29/12 1305 06/29/12 1030 06/29/12 0626  HGB 12.9 -- -- -- -- 13.2 -- -- -- -- --  HCT 37.8 -- -- -- -- 39.3 -- -- -- -- 45.0  PLT 228 -- -- -- -- 240 -- -- -- -- 292  APTT -- -- -- -- -- -- -- 38* -- -- --  LABPROT -- -- -- -- -- -- -- -- -- 14.9 --  INR -- -- -- -- -- -- -- -- -- 1.15 --  HEPARINUNFRC 0.90* -- -- 0.68 -- <0.10* -- -- -- -- --  CREATININE -- 1.22* 1.31* -- 1.11* -- -- -- -- -- --  CKTOTAL -- -- -- -- -- -- 1973* -- 2441* -- --  CKMB -- -- -- -- -- -- 137.8* -- 228.4* -- --  TROPONINI -- -- -- -- -- -- >20.00* -- >25.00* -- 17.12*    Estimated Creatinine Clearance: 65.4 ml/min (by C-G formula based on Cr of 1.22).  Assessment: 61 year old female with CAD s/p cath, awaiting possible CABG, for Heparin  Goal of Therapy:  Heparin level 0.3-0.7 units/ml Monitor platelets by anticoagulation protocol: Yes   Plan:  Decrease Heparin 1250 units/hr Check heparin level in 8 hours.  Lillion Elbert, Bronson Curb 07/01/2012,5:38 AM

## 2012-07-01 NOTE — Progress Notes (Signed)
ANTICOAGULATION CONSULT NOTE - Follow Up Consult  Pharmacy Consult for heparin  Indication: chest pain/ACS  No Known Allergies  Patient Measurements: Height: 5\' 8"  (172.7 cm) Weight: 260 lb 9.3 oz (118.2 kg) IBW/kg (Calculated) : 63.9  Heparin Dosing Weight: 92kg  Vital Signs: Temp: 98.4 F (36.9 C) (07/04 1332) Temp src: Oral (07/04 1332) BP: 106/73 mmHg (07/04 1332) Pulse Rate: 76  (07/04 1332)  Labs:  Basename 07/01/12 1520 07/01/12 1010 07/01/12 0455 06/30/12 2336 06/30/12 1401 06/30/12 0507 06/29/12 2022 06/29/12 1429 06/29/12 1305 06/29/12 1030 06/29/12 0626  HGB -- -- 12.9 -- -- 13.2 -- -- -- -- --  HCT -- -- 37.8 -- -- 39.3 -- -- -- -- 45.0  PLT -- -- 228 -- -- 240 -- -- -- -- 292  APTT -- -- -- -- -- -- -- 38* -- -- --  LABPROT -- -- -- -- -- -- -- -- -- 14.9 --  INR -- -- -- -- -- -- -- -- -- 1.15 --  HEPARINUNFRC 0.40 -- 0.90* -- 0.68 -- -- -- -- -- --  CREATININE -- 1.11* 1.05 1.22* -- -- -- -- -- -- --  CKTOTAL -- -- -- -- -- -- 1973* -- 2441* -- --  CKMB -- -- -- -- -- -- 137.8* -- 228.4* -- --  TROPONINI -- -- -- -- -- -- >20.00* -- >25.00* -- 17.12*    Estimated Creatinine Clearance: 71.9 ml/min (by C-G formula based on Cr of 1.11).  Assessment: 61 year old female with CAD s/p cath, awaiting possible CABG, No bleeding noted, VSS, groin level zero.    Goal of Therapy:  Heparin level 0.3-0.7 units/ml Monitor platelets by anticoagulation protocol: Yes   Plan:  Continue heparin at 1250 units/hr Daily heparin level and CBC  Thank you for allowing pharmacy to be a part of this patients care team.  Rowe Robert Pharm.D., BCPS Clinical Pharmacist 07/01/2012 3:59 PM Pager: [phone] Phone: [phone]

## 2012-07-01 NOTE — Progress Notes (Addendum)
Subjective:    Currently, the patient has no acute chest pain or shortness of breath. No N/V/D/C/F .  Interval Events: - Patient feeling well this AM, therefore, transferred to tele. - Plan for probable CABG in upcoming week, per CVTS - Gap closed   Objective:    Vital Signs:   Temp:  [98.1 F (36.7 C)-98.6 F (37 C)] 98.2 F (36.8 C) (07/04 0700) Pulse Rate:  [75-101] 79  (07/04 0700) Resp:  [16-25] 22  (07/04 0300) BP: (90-120)/(56-87) 114/73 mmHg (07/04 0700) SpO2:  [97 %-100 %] 98 % (07/04 0700) Last BM Date: 06/28/12  24-hour weight change: Weight change:   Intake/Output:   Intake/Output Summary (Last 24 hours) at 07/01/12 0927 Last data filed at 07/01/12 0700  Gross per 24 hour  Intake    919 ml  Output   1101 ml  Net   -182 ml      Physical Exam: General: Vital signs reviewed and noted. Well-developed, well-nourished, in no acute distress; alert, appropriate and cooperative throughout examination.  Lungs:  Normal respiratory effort. Clear to auscultation BL without crackles or wheezes.  Heart: RRR. S1 and S2 normal without gallop, murmur, or rubs.  Abdomen:  BS normoactive. Soft, Nondistended, non-tender.  No masses or organomegaly.  Extremities: No pretibial edema.     Labs:  Basic Metabolic Panel:  Lab [id number] 06/30/12 2336 06/30/12 1402 06/30/12 1000 06/30/12 0432  NA 139 137 130* 133* 134*  K 3.7 3.9 4.1 4.1 4.0  CL 103 100 96 99 100  CO2 24 26 20 23 24   GLUCOSE 154* 159* 472* 408* 317*  BUN 26* 30* 28* 24* 23  CREATININE 1.05 1.22* 1.31* 1.11* 1.07  CALCIUM 9.0 9.2 9.4 -- --  MG -- -- -- -- --  PHOS -- -- -- -- --    CBC:  Lab 07/01/12 0455 06/30/12 0507 06/29/12 0626  WBC 11.4* 14.4* 17.9*  NEUTROABS -- -- --  HGB 12.9 13.2 15.6*  HCT 37.8 39.3 45.0  MCV 77.5* 77.2* 77.1*  PLT 228 240 292    Cardiac Enzymes:  Lab 06/29/12 2022 06/29/12 1305 06/29/12 0626  CKTOTAL 1973* 2441* --  CKMB 137.8* 228.4* --  CKMBINDEX -- --  --  TROPONINI >20.00* >25.00* 17.12*    CBG:  Lab 07/01/12 0900 06/30/12 2136 06/30/12 1658 06/30/12 1330 06/30/12 1207  GLUCAP 269* 217* 304* 407* 61*    Microbiology: Results for orders placed during the hospital encounter of 06/29/12  MRSA PCR SCREENING     Status: Normal   Collection Time   06/29/12 12:26 PM      Component Value Range Status Comment   MRSA by PCR NEGATIVE  NEGATIVE Final     Coagulation Studies:  Basename 06/29/12 1030  LABPROT 14.9  INR 1.15    Imaging: Dg Chest 2 View  07/01/2012  *RADIOLOGY REPORT*  Clinical Data: Coronary disease, preop evaluation for bypass  CHEST - 2 VIEW  Comparison: 72,013  Findings: Normal heart size and vascularity.  Resolved mild edema pattern.  No current CHF, pneumonia, effusion or pneumothorax. Trachea midline.  IMPRESSION: Resolving edema.  Original Report Authenticated By: Jerilynn Mages. Daryll Brod, M.D.     Medications:    Infusions:    . sodium chloride 10 mL/hr (06/29/12 2212)  . heparin 1,250 Units/hr (07/01/12 0551)  . nitroGLYCERIN 5 mcg/min (06/30/12 2200)  . DISCONTD: heparin 1,600 Units/hr (06/30/12 1208)    Scheduled Medications:    . aspirin EC  325  mg Oral Daily  . atorvastatin  80 mg Oral q1800  . brinzolamide  1 drop Both Eyes TID  . carvedilol  3.125 mg Oral BID WC  . Fluticasone-Salmeterol  1 puff Inhalation BID  . insulin aspart  0-20 Units Subcutaneous TID WC  . insulin aspart  0-5 Units Subcutaneous QHS  . insulin aspart  10 Units Subcutaneous Once  . insulin glargine  40 Units Subcutaneous Daily  . insulin regular  10 Units Subcutaneous Once  . latanoprost  1 drop Both Eyes QHS  . lisinopril  2.5 mg Oral Daily  . pantoprazole  40 mg Oral Q0600  . DISCONTD: insulin aspart  0-15 Units Subcutaneous TID WC  . DISCONTD: insulin aspart  0-15 Units Subcutaneous TID WC  . DISCONTD: insulin aspart  0-9 Units Subcutaneous TID WC  . DISCONTD: insulin aspart  6 Units Subcutaneous TID WC  . DISCONTD:  insulin aspart  6 Units Subcutaneous TID WC  . DISCONTD: insulin aspart  8 Units Subcutaneous TID WC  . DISCONTD: insulin glargine  30 Units Subcutaneous Daily  . DISCONTD: insulin glargine  30 Units Subcutaneous Daily    PRN Medications: acetaminophen, dextrose, LORazepam, morphine injection, nitroGLYCERIN, ondansetron (ZOFRAN) IV   Assessment/ Plan:    Pt is a 61 y.o. yo female with a PMHx of uncontrolled DMII (A1c 10.5), HTN, cocaine abuse who was admitted on 06/29/2012 with symptoms of chest pain after cocaine use and also found to have profound hyperglycemia with admission CBG > 700 mg/dL. Admission troponin was > 17, diagnosed with NSTEMI, she was started on nitroglycerin and heparin gtt, and loaded with brilinta. Cardiac cath on 7/2 revealed severe 3 vessel disease (80% focal stenosis in proximal LAD, left circumflex with mid circumflex with a focal 80% ulcerated stenosis, and RCA with 40-50% diffuse disease in proximal vessel). These lesions were not amenable to PCI alone, therefore, she is planned for probable CABG in the upcoming week.  1. NSTEMI - in setting of cocaine abuse, and severe underlying 3 vessel disease evidence on cardiac catheterization on 07/02. Currently, patient remains on heparin and nitroglycerin drip, which are adequately controlling her chest pain, in anticipation of possible CABG in the up coming week.  - Appreciate cardiology and CVTS assistance and input in the management of our patient.   - Continue heparin and nitro drip  - Continue aspirin, heparin, statin, and ACE-I. - Will wean NTG drip. - Continue SL NTG PRN. - Coreg was added to regimen, and increased in dosage. - Await CABG in upcoming week.   2. Hyperglycemia in setting of baseline poorly controlled DMII (A1c of 10.5 on admission). - CBG remains elevated throughout the day. Initially started on insulin drip, which subsequently was changed to basal lantus with meal coverage and SSI.  - Will escalate  basal insulin, as well as meal coverage. - Diabetic diet   3. Acute Kidney Injury - Mildly elevated. Likely due to addition of ACE-I - continue to monitor CBC.   3. Hypertension - On Nitro drip, Cards has added Coreg and Lisinopril.   4. Hyperlipidemia - Non compliant on medications as stated above. Will switch to Lipitor and encourage medication compliance.   5. RBC microcytosis - Hgb is normal. Cards has ordered stool guiac. Will continue to follow.   6. Cocaine abuse - Provide education regarding cessation.  7. DVT Prophylaxis -  IV heparin   8. Dispo - Deferred for now, until surgery can be planned for next week for CABG.  Length of Stay: 2 days   Signed: Liborio Nixon, Internal Medicine Resident Pager: [phone] (7AM-5PM) 07/01/2012, 9:27 AM     Internal Medicine Teaching Service Attending Note Date: 07/01/2012  Patient name: [name] [name]  Medical record number: [id number]  Date of birth: [birth year]    This patient has been seen and discussed with the house staff. Please see their note for complete details. I concur with their findings with the following additions/corrections: - IDDM type 2: increase basal insulin and meal coverage. - Elevated Cr: monitor in setting addition of ACE-i. - s/p NSTEMI: nitro gtt should be weaned, heparin gtt. Cardiology following. Coreg, ASA, ACE-i. Plans for CABG next week.  Dominic Pea 07/01/2012, 5:19 PM

## 2012-07-02 ENCOUNTER — Inpatient Hospital Stay (HOSPITAL_COMMUNITY): Payer: Medicaid Other

## 2012-07-02 ENCOUNTER — Encounter (HOSPITAL_COMMUNITY): Payer: Medicaid Other

## 2012-07-02 ENCOUNTER — Other Ambulatory Visit: Payer: Self-pay | Admitting: *Deleted

## 2012-07-02 DIAGNOSIS — Z01810 Encounter for preprocedural cardiovascular examination: Secondary | ICD-10-CM

## 2012-07-02 DIAGNOSIS — I219 Acute myocardial infarction, unspecified: Secondary | ICD-10-CM

## 2012-07-02 DIAGNOSIS — I2510 Atherosclerotic heart disease of native coronary artery without angina pectoris: Secondary | ICD-10-CM

## 2012-07-02 DIAGNOSIS — I517 Cardiomegaly: Secondary | ICD-10-CM

## 2012-07-02 LAB — PULMONARY FUNCTION TEST

## 2012-07-02 LAB — HEPARIN LEVEL (UNFRACTIONATED): Heparin Unfractionated: 0.26 IU/mL — ABNORMAL LOW (ref 0.30–0.70)

## 2012-07-02 LAB — CBC
HCT: 37.4 % (ref 36.0–46.0)
Hemoglobin: 12.3 g/dL (ref 12.0–15.0)
MCHC: 32.9 g/dL (ref 30.0–36.0)
RBC: 4.75 MIL/uL (ref 3.87–5.11)
WBC: 9.5 10*3/uL (ref 4.0–10.5)

## 2012-07-02 LAB — GLUCOSE, CAPILLARY
Glucose-Capillary: 142 mg/dL — ABNORMAL HIGH (ref 70–99)
Glucose-Capillary: 190 mg/dL — ABNORMAL HIGH (ref 70–99)
Glucose-Capillary: 173 mg/dL — ABNORMAL HIGH (ref 70–99)

## 2012-07-02 LAB — OCCULT BLOOD X 1 CARD TO LAB, STOOL: Fecal Occult Bld: NEGATIVE

## 2012-07-02 MED ORDER — NITROGLYCERIN IN D5W 200-5 MCG/ML-% IV SOLN: 2.0000 ug/min | INTRAVENOUS | Status: DC

## 2012-07-02 MED ORDER — INSULIN ASPART 100 UNIT/ML ~~LOC~~ SOLN
0.0000 [IU] | Freq: Every day | SUBCUTANEOUS | Status: DC
  Administered 2012-07-02: 2 [IU] via SUBCUTANEOUS

## 2012-07-02 MED ORDER — ALBUTEROL SULFATE (5 MG/ML) 0.5% IN NEBU
2.5000 mg | INHALATION_SOLUTION | Freq: Once | RESPIRATORY_TRACT | Status: AC
  Administered 2012-07-02: 2.5 mg via RESPIRATORY_TRACT

## 2012-07-02 MED ORDER — NITROGLYCERIN 2 % TD OINT
2.0000 [in_us] | TOPICAL_OINTMENT | Freq: Four times a day (QID) | TRANSDERMAL | Status: DC
Start: 2012-07-02 — End: 2012-07-09
  Administered 2012-07-02 – 2012-07-09 (×27): 2 [in_us] via TOPICAL
  Filled 2012-07-02: qty 30

## 2012-07-02 MED ORDER — INSULIN ASPART 100 UNIT/ML ~~LOC~~ SOLN
0.0000 [IU] | Freq: Three times a day (TID) | SUBCUTANEOUS | Status: DC
  Administered 2012-07-02: 3 [IU] via SUBCUTANEOUS
  Administered 2012-07-03: 8 [IU] via SUBCUTANEOUS

## 2012-07-02 MED ORDER — INSULIN ASPART 100 UNIT/ML ~~LOC~~ SOLN
8.0000 [IU] | Freq: Three times a day (TID) | SUBCUTANEOUS | Status: DC
  Administered 2012-07-02: 8 [IU] via SUBCUTANEOUS

## 2012-07-02 NOTE — Progress Notes (Addendum)
Resident Addendum to Medical Student Note   I have seen and examined the patient, and reviewed the daily progress note by Andrey Farmer, MS IV and discussed the care of the patient with them.  See below for documentation of my findings, assessment, and plans.   Subjective:    Currently, the patient has no acute chest pain or shortness of breath. No N/V/D/C/F. However, she did have episode of CP yesterday, for which she was restarted on nitro drip.  Interval Events: - None.   Objective:    VS: Reviewed as documented in electronic medical record  Meds: Reviewed as documented in electronic medical record  Labs: Reviewed as documented in electronic medical record  Imaging: Reviewed as documented in electronic medical record   Medications:    Infusions:    . sodium chloride 10 mL/hr (06/29/12 2212)  . heparin 1,250 Units/hr (07/02/12 0410)  . nitroGLYCERIN 5 mcg/min (07/01/12 1745)    Scheduled Medications:    . aspirin EC  325 mg Oral Daily  . atorvastatin  80 mg Oral q1800  . brinzolamide  1 drop Both Eyes TID  . carvedilol  6.25 mg Oral BID WC  . insulin aspart  0-20 Units Subcutaneous TID WC  . insulin aspart  0-5 Units Subcutaneous QHS  . insulin aspart  10 Units Subcutaneous Once  . insulin aspart  4 Units Subcutaneous TID WC  . insulin glargine  45 Units Subcutaneous Daily  . latanoprost  1 drop Both Eyes QHS  . lisinopril  2.5 mg Oral Daily  . pantoprazole  40 mg Oral Q0600  . DISCONTD: carvedilol  3.125 mg Oral BID WC  . DISCONTD: Fluticasone-Salmeterol  1 puff Inhalation BID  . DISCONTD: insulin aspart  0-20 Units Subcutaneous TID WC  . DISCONTD: insulin aspart  0-5 Units Subcutaneous QHS  . DISCONTD: insulin glargine  40 Units Subcutaneous Daily    PRN Medications: acetaminophen, dextrose, LORazepam, morphine injection, nitroGLYCERIN, ondansetron (ZOFRAN) IV   Assessment/ Plan:    Pt is a 61 y.o. yo female with a PMHx of uncontrolled DMII (A1c  10.5), HTN, cocaine abuse who was admitted on 06/29/2012 with symptoms of chest pain after cocaine use and also found to have profound hyperglycemia with admission CBG > 700 mg/dL. Admission troponin was > 17, diagnosed with NSTEMI, she was started on nitroglycerin and heparin gtt, and loaded with brilinta. Cardiac cath on 7/2 revealed severe 3 vessel disease (80% focal stenosis in proximal LAD, left circumflex with mid circumflex with a focal 80% ulcerated stenosis, and RCA with 40-50% diffuse disease in proximal vessel). These lesions were not amenable to PCI alone, therefore, she is planned for probable CABG in the upcoming week.  1. NSTEMI - in setting of cocaine abuse, and severe underlying 3 vessel disease evidence on cardiac catheterization on 07/02. Currently, patient remains on heparin and nitroglycerin drip, which are adequately controlling her chest pain, in anticipation of possible CABG in the up coming week.  - Appreciate cardiology and CVTS assistance and input in the management of our patient.   - Continue heparin drip - will need to be continued until surgery (as recommended by cardiology, Dr. Stanford Breed) - Continue aspirin, heparin, statin, and ACE-I. - Will wean NTG drip. - Continue SL NTG PRN - I spoke with Dr. Stanford Breed, and he also recommended that if NTG drip discontinued, patient should be started on NTG paste 2 inches q6h (hold from 12AM to 6AM). Will therefore, make this adjustment to the regimen. -  Continue Coreg. - Await CABG in upcoming week.   2. Hyperglycemia in setting of baseline poorly controlled DMII (A1c of 10.5 on admission) - CBG remains elevated throughout the day. Initially started on insulin drip, which subsequently was changed to basal lantus with meal coverage and SSI.  - Was escalated basal insulin, as well as meal coverage, but the increased doses will be instituted today. - Diabetic diet.  3. Acute Kidney Injury - Resolved. On admission, prerenal azotemia  present, which resolved. On 07/04 Mild bump in Cr to 1.1, in setting of initiating an ACE-I, which can be expected, and is ok at this point. - continue to monitor BMET.   3. Hypertension - On Nitro drip currently, Cards has added Coreg and Lisinopril.  - wean NTG drip, start nitro paste. - continue Lisinopril and Coreg.  4. Hyperlipidemia - LDL 154, goal LDL < 70 mg/dL given concurrent DMII and severe CAD.  Noncompliance with home pravastatin prior to admission. - Changed to Lipitor during this hospitalization. - Will plan to recheck FLP in approximately 6 weeks as an outpatient to assess if goal LDL achieved with this therapy, and will alternatively adjust as indicated.  5. RBC microcytosis - Hemoglobin remains normal. Anemia panel showing iron 54, TIBC 230, saturation ratio 23 and ferritin 266, which is not highly suggestive of iron deficiency anemia. FOBT negative. - Will continue to monitor hemoglobin. - FOBT performed today - is negative.  6. Cocaine abuse - Provided education regarding cessation.  7. DVT Prophylaxis -  IV heparin   8. Dispo - Deferred for now, until surgery can be planned for next week for CABG.    Length of Stay: 3 days   Signed: Chilton Greathouse, Jacelyn Pi, Internal Medicine Resident Pager: [phone] (7AM-5PM) 07/02/2012, 6:58 AM

## 2012-07-02 NOTE — Progress Notes (Signed)
Inpatient Diabetes Program Recommendations  AACE/ADA: New Consensus Statement on Inpatient Glycemic Control (2009)  Target Ranges:  Prepandial:   less than 140 mg/dL      Peak postprandial:   less than 180 mg/dL (1-2 hours)      Critically ill patients:  140 - 180 mg/dL   Reason for Visit: Post-prandial hyperglycemia  Inpatient Diabetes Program Recommendations Insulin - IV drip/GlucoStabilizer: xxx Correction (SSI): Decrease to moderate tidwc and HS scale and increase meal coverage per below recommendation Insulin - Meal Coverage: Increase to 8 units tidwc  Note: Thank you, Rosita Kea, RN, CNS, Diabetes Coordinator [phone])

## 2012-07-02 NOTE — Progress Notes (Signed)
3 Days Post-Op Procedure(s) (LRB): LEFT HEART CATHETERIZATION WITH CORONARY ANGIOGRAM (N/A) Subjective: 2 v CAD MI poor LV Admitted DKA CBG still 300-400 Objective: Vital signs in last 24 hours: Temp:  [99.4 F (37.4 C)-99.7 F (37.6 C)] 99.7 F (37.6 C) (07/05 1300) Pulse Rate:  [74-78] 75  (07/05 1300) Cardiac Rhythm:  [-] Normal sinus rhythm (07/05 0800) Resp:  [18] 18  (07/05 1300) BP: (107-123)/(66-71) 121/70 mmHg (07/05 1300) SpO2:  [94 %-98 %] 95 % (07/05 1300) Weight:  [259 lb 14.8 oz (117.9 kg)] 259 lb 14.8 oz (117.9 kg) (07/05 0429)  Hemodynamic parameters for last 24 hours:    Intake/Output from previous day: 07/04 0701 - 07/05 0700 In: 1498.1 [P.O.:960; I.V.:538.1] Out: 301 [Urine:300; Stool:1] Intake/Output this shift: Total I/O In: 480 [P.O.:480] Out: 350 [Urine:350]    Lab Results:  Basename 07/02/12 0540 07/01/12 0455  WBC 9.5 11.4*  HGB 12.3 12.9  HCT 37.4 37.8  PLT 227 228   BMET:  Basename 07/01/12 1010 07/01/12 0455  NA 136 139  K 3.9 3.7  CL 102 103  CO2 24 24  GLUCOSE 275* 154*  BUN 25* 26*  CREATININE 1.11* 1.05  CALCIUM 9.1 9.0    PT/INR: No results found for this basename: LABPROT,INR in the last 72 hours ABG    Component Value Date/Time   PHART 7.408* 06/29/2012 1251   HCO3 22.4 06/29/2012 1251   TCO2 24 06/29/2012 1251   ACIDBASEDEF 2.0 06/29/2012 1251   O2SAT 94.0 06/29/2012 1251   CBG (last 3)   Basename 07/02/12 1109 07/02/12 0630 07/01/12 2159  GLUCAP 190* 142* 204*    Assessment/Plan: S/P Procedure(s) (LRB): LEFT HEART CATHETERIZATION WITH CORONARY ANGIOGRAM (N/A) Blood sugars need to be better controlled before CABG   LOS: 3 days    [name],Alexiz Cothran 07/02/2012

## 2012-07-02 NOTE — Progress Notes (Signed)
VASCULAR LAB PRELIMINARY  PRELIMINARY  PRELIMINARY  PRELIMINARY  Pre-op Cardiac Surgery  Carotid Findings:  No evidence of significant ICA stenosis bilaterally. Bilateral vertebral artery flow is antegrade  Upper Extremity Right Left  Brachial Pressures 139 Triphasic 121 Triphasic  Radial Waveforms Triphasic Triphasic  Ulnar Waveforms Triphasic Triphasic  Palmar Arch (Allen's Test) Normal Normal   Findings:  Doppler waveforms remained normal bilaterally with both radial and ulnar compressions    Lower  Extremity Right Left      Anterior Tibial 69  Monophasic 78 Monophasic  Posterior Tibial 102 Monophasic 99 Monophasic  Ankle/Brachial Indices 0.73 0.71    Findings:  ABIs and Doppler waveforms indicate a moderate reduction in arterial flow bilaterally at rest.   Ruthellen Tippy, RVS 07/02/2012, 3:15 PM

## 2012-07-02 NOTE — Progress Notes (Signed)
RESIDENT ADDENDUM TO MEDICAL STUDENT NOTE  I have seen and examined the patient, and reviewed the daily progress note by Andrey Farmer, MS IV and discussed the care of the patient with them. Please see my progress note from 07/02/2012 for further details regarding assessment and plan.    Signed: Chilton Greathouse, Jacelyn Pi, Internal Medicine Resident Pager: [phone] (7AM-5PM) 07/02/2012, 10:42 AM

## 2012-07-02 NOTE — Progress Notes (Signed)
INTERNAL MEDICINE TEACHING SERVICE Attending Note  Date: 07/02/2012  Patient name: [name] [name]  Medical record number: [id number]  Date of birth: 1951-11-10    This patient has been seen and discussed with the house staff. Please see their note for complete details. I concur with their findings with the following additions/corrections: States had a brief, less than a minute episode of CP, resolved with NTG, yesterday. Feels well today and denies CP, SOB, N/V/D/C.  A/P: Patient is a 61 y.o., female with a PMHX of NIDDM type 2, cocaine use, HTN, hx CVA, HL, s/p NSTEMI and uncontrolled DM.  1. S/p NSTEMI: hemodynamically stable. Weaned off NTG gtt. Agree with using NTG patch/paste instead. Remains on heparin gtt. She has significant 3 vessel disease per cardiac cath on 7/2. Continue ASA, statin and ACE-i. CABG planned next week. 2. Uncontrolled DM: HbA1C of 10.5. Adjusted basal insulin, follow BG, obtaining better control slowly. 3. HTN: Better control. Coreg, lisinopril. 4. Elevated Cr: Initially contributed to pre-renal state. I suspect a component of ACE-i induced change. Monitor.  5. HL: continue statin. Needs better control. 6. Microcytosis: No iron deficiency, negative stool FOBT. Outpatient Colonoscopy if none in past 10 years as screening. Follow H/H, stable. 7. Rest per resident physician note.   Dominic Pea, DO  07/02/2012, 11:16 AM

## 2012-07-02 NOTE — Consult Note (Signed)
ANTICOAGULATION CONSULT NOTE - Follow Up Consult  Pharmacy Consult for Heparin Indication: Chest pain, VTE prophylaxis pending planned CABG  No Known Allergies  Patient Measurements: Height: 5\' 8"  (172.7 cm) Weight: 259 lb 14.8 oz (117.9 kg) IBW/kg (Calculated) : 63.9  Heparin Dosing Weight: 92 kg  Vital Signs: Temp: 99.7 F (37.6 C) (07/05 1300) Temp src: Oral (07/05 0429) BP: 121/70 mmHg (07/05 1300) Pulse Rate: 75  (07/05 1300)  Labs:  Basename 07/02/12 0540 07/01/12 1520 07/01/12 1010 07/01/12 0455 06/30/12 2336 06/30/12 0507 06/29/12 2022 06/29/12 1429  HGB 12.3 -- -- 12.9 -- -- -- --  HCT 37.4 -- -- 37.8 -- 39.3 -- --  PLT 227 -- -- 228 -- 240 -- --  APTT -- -- -- -- -- -- -- 38*  LABPROT -- -- -- -- -- -- -- --  INR -- -- -- -- -- -- -- --  HEPARINUNFRC 0.26* 0.40 -- 0.90* -- -- -- --  CREATININE -- -- 1.11* 1.05 1.22* -- -- --  CKTOTAL -- -- -- -- -- -- 1973* --  CKMB -- -- -- -- -- -- 137.8* --  TROPONINI -- -- -- -- -- -- >20.00* --    Estimated Creatinine Clearance: 71.8 ml/min (by C-G formula based on Cr of 1.11).   Medications:  Infusions:    . sodium chloride 10 mL/hr (06/29/12 2212)  . heparin 1,250 Units/hr (07/02/12 0410)  . nitroGLYCERIN Stopped (07/02/12 0815)  . DISCONTD: nitroGLYCERIN 5 mcg/min (07/01/12 1745)   Assessment:  61 y/o female s/p recent NSTEMI currently on Heparin infusion until planned CABG next week  Heparin rate = 1250 units/hr.  Heparin level SUBtherapeutic 0.26 units/ml  Goal of Therapy:   Heparin level 0.3-0.7 units/ml   Plan:   Increase Heparin infusion to 1550 units/hr.  Check anti-Xa level in 6 hours and daily while on heparin  Continue to monitor H&H and platelets  [name] [name] J  Pharm.D 07/02/2012,1:23 PM

## 2012-07-02 NOTE — Progress Notes (Signed)
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  No Known Allergies  Patient Measurements: Height: 5\' 8"  (172.7 cm) Weight: 259 lb 14.8 oz (117.9 kg) IBW/kg (Calculated) : 63.9  Heparin Dosing Weight: 91.2 kg  Vital Signs: Temp: 99.6 F (37.6 C) (07/05 2010) Temp src: Oral (07/05 2010) BP: 124/84 mmHg (07/05 2010) Pulse Rate: 74  (07/05 2010)  Labs:  Basename 07/02/12 1931 07/02/12 0540 07/01/12 1520 07/01/12 1010 07/01/12 0455 06/30/12 2336 06/30/12 0507  HGB -- 12.3 -- -- 12.9 -- --  HCT -- 37.4 -- -- 37.8 -- 39.3  PLT -- 227 -- -- 228 -- 240  APTT -- -- -- -- -- -- --  LABPROT -- -- -- -- -- -- --  INR -- -- -- -- -- -- --  HEPARINUNFRC 0.45 0.26* 0.40 -- -- -- --  CREATININE -- -- -- 1.11* 1.05 1.22* --  CKTOTAL -- -- -- -- -- -- --  CKMB -- -- -- -- -- -- --  TROPONINI -- -- -- -- -- -- --    Estimated Creatinine Clearance: 71.8 ml/min (by C-G formula based on Cr of 1.11).   Medications:  Infusions:    . sodium chloride 10 mL/hr (06/29/12 2212)  . heparin 1,550 Units/hr (07/02/12 1523)  . nitroGLYCERIN Stopped (07/02/12 0815)  . DISCONTD: nitroGLYCERIN 5 mcg/min (07/01/12 1745)    Assessment: 61 y.o. M on heparin for 3VCAD while awaiting CABG next week with a therapeutic heparin level this evening although drawn early. (HL 0.45, goal of 0.3-0.7). Hgb/Hct/Plt ok. No s/sx of bleeding noted at this time. Will reduce rate slightly this evening given that the heparin level was drawn early and likely will continue to trend up.   Goal of Therapy:  Heparin level 0.3-0.7 units/ml Monitor platelets by anticoagulation protocol: Yes   Plan:  1. Decrease heparin drip rate to 1450 units/hr (14.5 ml/hr) 2. Will continue to monitor for any signs/symptoms of bleeding and will follow up with heparin level in the a.m.   Alycia Rossetti, PharmD, BCPS Clinical Pharmacist Pager: [phone] 07/02/2012 9:26  PM

## 2012-07-02 NOTE — Progress Notes (Signed)
@   Subjective:  No Chest pain or dyspnea.   Objective:  Filed Vitals:   07/01/12 1200 07/01/12 1332 07/01/12 2155 07/02/12 0429  BP: 164/21 106/73 107/66 123/71  Pulse: 78 76 78 74  Temp: 98.5 F (36.9 C) 98.4 F (36.9 C) 99.5 F (37.5 C) 99.4 F (37.4 C)  TempSrc: Oral Oral Oral Oral  Resp: 20  18 18   Height:      Weight:    117.9 kg (259 lb 14.8 oz)  SpO2: 98% 95% 98% 94%    Intake/Output from previous day:  Intake/Output Summary (Last 24 hours) at 07/02/12 0717 Last data filed at 07/02/12 0529  Gross per 24 hour  Intake 1498.11 ml  Output    301 ml  Net 1197.11 ml    Physical Exam: Physical exam: Well-developed obese in no acute distress.  Skin is warm and dry.  HEENT is normal.  Neck is supple.  Chest is clear to auscultation with normal expansion.  Cardiovascular exam is regular rate and rhythm.  Abdominal exam nontender or distended. No masses palpated. Extremities show no edema. Radial cath site with no hematoma neuro grossly intact    Lab Results: Basic Metabolic Panel:  Basename 07/01/12 1010 07/01/12 0455  NA 136 139  K 3.9 3.7  CL 102 103  CO2 24 24  GLUCOSE 275* 154*  BUN 25* 26*  CREATININE 1.11* 1.05  CALCIUM 9.1 9.0  MG -- --  PHOS -- --   CBC:  Basename 07/02/12 0540 07/01/12 0455  WBC 9.5 11.4*  NEUTROABS -- --  HGB 12.3 12.9  HCT 37.4 37.8  MCV 78.7 77.5*  PLT 227 228   Cardiac Enzymes:  Basename 06/29/12 2022 06/29/12 1305  CKTOTAL 1973* 2441*  CKMB 137.8* 228.4*  CKMBINDEX -- --  TROPONINI >20.00* >25.00*     Assessment/Plan:  1) S/P MI - Cath reveals 3 vessel CAD and severe LV dysfunction. Drug screen positive for cocaine. Plan continue ASA, heparin, NTG, statin and ACEI. Continue coreg 6.25 mg po BID. For CABG next week. 2) Ischemic CM - continue ACEI and coreg as outlined. 3) CAD - As above 4) DM - Management per IM 5) Substance abuse - needs rehab once medical issues addressed. 6) Noncompliance - stressed  need for medication compliance. 7) HTN - titrate meds as needed 8) Hyperlipidemia - continue statin 9) Microcytosis - guaiac stool; continue protonix; GI eval after she recovers from cardiac issues.  Kirk Ruths 07/02/2012, 7:17 AM

## 2012-07-02 NOTE — Progress Notes (Signed)
Patient ID: [name] [name], female   DOB: Jan 22, 1951, 61 y.o.   MRN: [id number]  Medical Student Daily Progress Note  Subjective: No chest pain, shortness of breath, nausea, vomiting, or other symptoms. Patient states that she is feeling good and regrets that she will be missing her family's reunion. Understands her need for CABG.  Interval Events- Had stated episode of chest pain yesterday that was 3/10. Placed back on Nitro drip.  Objective: Vital signs in last 24 hours: Filed Vitals:   07/01/12 1200 07/01/12 1332 07/01/12 2155 07/02/12 0429  BP: 164/21 106/73 107/66 123/71  Pulse: 78 76 78 74  Temp: 98.5 F (36.9 C) 98.4 F (36.9 C) 99.5 F (37.5 C) 99.4 F (37.4 C)  TempSrc: Oral Oral Oral Oral  Resp: 20  18 18   Height:      Weight:    117.9 kg (259 lb 14.8 oz)  SpO2: 98% 95% 98% 94%   Weight change:   Intake/Output Summary (Last 24 hours) at 07/02/12 [id number] Last data filed at 07/02/12 [id number]  Gross per 24 hour  Intake 1210.11 ml  Output    301 ml  Net 909.11 ml   Physical Exam:  General Appearance:    Alert, cooperative, no distress, appears stated age  Head:    Normocephalic, without obvious abnormality, atraumatic  Back:     Symmetric, no curvature, ROM normal, no CVA tenderness  Lungs:     Clear to auscultation bilaterally, respirations unlabored  Chest Wall:    No tenderness or deformity   Heart:    Regular rate and rhythm, S1 and S2 normal, no murmur, rub   or gallop  Extremities:   Extremities normal, atraumatic, no cyanosis or edema   Lab Results: @labtest2 @ Micro Results: Recent Results (from the past 240 hour(s))  MRSA PCR SCREENING     Status: Normal   Collection Time   06/29/12 12:26 PM      Component Value Range Status Comment   MRSA by PCR NEGATIVE  NEGATIVE Final    Studies/Results: Dg Chest 2 View  07/01/2012  *RADIOLOGY REPORT*  Clinical Data: Coronary disease, preop evaluation for bypass  CHEST - 2 VIEW  Comparison: 72,013  Findings: Normal heart  size and vascularity.  Resolved mild edema pattern.  No current CHF, pneumonia, effusion or pneumothorax. Trachea midline.  IMPRESSION: Resolving edema.  Original Report Authenticated By: Jerilynn Mages. Daryll Brod, M.D.   Medications: I have reviewed the patient's current medications. Scheduled Meds:   . aspirin EC  325 mg Oral Daily  . atorvastatin  80 mg Oral q1800  . brinzolamide  1 drop Both Eyes TID  . carvedilol  6.25 mg Oral BID WC  . insulin aspart  0-20 Units Subcutaneous TID WC  . insulin aspart  0-5 Units Subcutaneous QHS  . insulin aspart  4 Units Subcutaneous TID WC  . insulin glargine  45 Units Subcutaneous Daily  . latanoprost  1 drop Both Eyes QHS  . lisinopril  2.5 mg Oral Daily  . nitroGLYCERIN  2 inch Topical Q6H  . pantoprazole  40 mg Oral Q0600   Continuous Infusions:   . sodium chloride 10 mL/hr (06/29/12 2212)  . heparin 1,250 Units/hr (07/02/12 0410)  . nitroGLYCERIN Stopped (07/02/12 0815)   PRN Meds:.acetaminophen, dextrose, LORazepam, morphine injection, nitroGLYCERIN, ondansetron (ZOFRAN) IV Assessment/Plan:  This is a 61 y/o woman with history of CVA, DM, HTN, HLD, and cocaine admitted on 07/02 with CP (in setting of cocaine) and BG greater than  700.Trop was 17 on admission and EKG showed T wave inversion. She was diagnosed with a NSTEMI and was emergently cathed revealing severe three vessel disease and an EF of 25%. She was started on a nitro and heparin drip. It was determined that she was not amenable to PCI and thus CABG has been planned for the following week.. She is a type 2 diabetic with uncontrolled diabetes and so she was placed on a regimented insulin plan.   1.  NSTEMI (non-ST elevated myocardial infarction)- CT surgery has spoken to patient and family and will likely have CABG sometime next week. Nitro drip was D/C'ed yesterday but added back on for brief complaint of chest pain. Will wean nitro drip today due to Nitro resistance from Nitro drips  developing in most patients after 24 hours. Cards has recommended that if Nitro drip is discontinued, patient should be started on Nitro paste 2 inches q6 with it being held from 12 am to 6 am. This change has been made. Nitro PRN has been added. Heparin drip is continued and appreciate management from pharmacy and Cards. On aspirin.   2. Hyperglycemia- Most recent BG is 142 which is a significant improvement from her previous BG's. Will continue regiment of Lantus 45 units, prandial aspart 4 units with each meal, and SSI coverage.  3. ACE-I induced creatinine bump- A small creatinine bump with associated with addition of lisinopril.  Will continue to monitor.  4. Hyperlipidemia- On lipitor 80 mg. Target LDL level will be <70 due to CHD, diabetes, and hyperlipidemia. Will continue to encourage medication compliance.  5. Hypertension- Transitioning from Nitro drip to oral medications Coreg and lisinopril.  6. Cocaine abuse- Will stress dangers of cocaine abuse and will discuss treatment options at later date.  7. RBC microcytosis- Hgb is normal and Iron studies are normal. Will continue to clinically assess for any signs of anemia.  8. Prophy- IV Heparin drip  9. Dispo- Awaiting CT surgery recs in regards to CABG date.   LOS: 3 days   This is a Careers information officer Note.  The care of the patient was discussed with Dr. Carloyn Jaeger and the assessment and plan formulated with their assistance.  Please see their attached note for official documentation of the daily encounter.  Andrey Farmer T 07/02/2012, 9:37 AM

## 2012-07-02 NOTE — Progress Notes (Signed)
  Echocardiogram 2D Echocardiogram has been performed.  [name] [name] FRANCES 07/02/2012, 12:23 PM

## 2012-07-03 LAB — CBC
HCT: 35.2 % — ABNORMAL LOW (ref 36.0–46.0)
MCH: 26.1 pg (ref 26.0–34.0)
MCV: 78.6 fL (ref 78.0–100.0)
Platelets: 216 10*3/uL (ref 150–400)
RDW: 16.9 % — ABNORMAL HIGH (ref 11.5–15.5)
WBC: 13.1 10*3/uL — ABNORMAL HIGH (ref 4.0–10.5)

## 2012-07-03 LAB — BASIC METABOLIC PANEL
BUN: 17 mg/dL (ref 6–23)
CO2: 26 mEq/L (ref 19–32)
Calcium: 8.6 mg/dL (ref 8.4–10.5)
Chloride: 104 mEq/L (ref 96–112)
Creatinine, Ser: 1.13 mg/dL — ABNORMAL HIGH (ref 0.50–1.10)

## 2012-07-03 LAB — URINALYSIS, MICROSCOPIC ONLY
Bilirubin Urine: NEGATIVE
Ketones, ur: NEGATIVE mg/dL
Nitrite: NEGATIVE
Urobilinogen, UA: 1 mg/dL (ref 0.0–1.0)

## 2012-07-03 LAB — GLUCOSE, CAPILLARY
Glucose-Capillary: 276 mg/dL — ABNORMAL HIGH (ref 70–99)
Glucose-Capillary: 220 mg/dL — ABNORMAL HIGH (ref 70–99)

## 2012-07-03 MED ORDER — INSULIN ASPART 100 UNIT/ML ~~LOC~~ SOLN
0.0000 [IU] | Freq: Every day | SUBCUTANEOUS | Status: DC
  Administered 2012-07-04 – 2012-07-07 (×3): 2 [IU] via SUBCUTANEOUS

## 2012-07-03 MED ORDER — INSULIN ASPART 100 UNIT/ML ~~LOC~~ SOLN
0.0000 [IU] | Freq: Three times a day (TID) | SUBCUTANEOUS | Status: DC
  Administered 2012-07-03 – 2012-07-04 (×4): 7 [IU] via SUBCUTANEOUS
  Administered 2012-07-05: 4 [IU] via SUBCUTANEOUS
  Administered 2012-07-05 – 2012-07-07 (×3): 3 [IU] via SUBCUTANEOUS
  Administered 2012-07-07 – 2012-07-08 (×3): 4 [IU] via SUBCUTANEOUS

## 2012-07-03 MED ORDER — SENNOSIDES-DOCUSATE SODIUM 8.6-50 MG PO TABS
1.0000 | ORAL_TABLET | Freq: Two times a day (BID) | ORAL | Status: DC
  Administered 2012-07-03 – 2012-07-08 (×12): 1 via ORAL
  Filled 2012-07-03 (×14): qty 1

## 2012-07-03 MED ORDER — INSULIN ASPART 100 UNIT/ML ~~LOC~~ SOLN
9.0000 [IU] | Freq: Three times a day (TID) | SUBCUTANEOUS | Status: DC
  Administered 2012-07-03 – 2012-07-04 (×3): 9 [IU] via SUBCUTANEOUS

## 2012-07-03 MED ORDER — METRONIDAZOLE 500 MG PO TABS
2000.0000 mg | ORAL_TABLET | Freq: Once | ORAL | Status: AC
  Administered 2012-07-03: 2000 mg via ORAL
  Filled 2012-07-03: qty 4

## 2012-07-03 MED ORDER — POLYETHYLENE GLYCOL 3350 17 G PO PACK
17.0000 g | PACK | Freq: Every day | ORAL | Status: DC
  Administered 2012-07-03 – 2012-07-08 (×6): 17 g via ORAL
  Filled 2012-07-03 (×7): qty 1

## 2012-07-03 NOTE — Progress Notes (Signed)
CARDIAC REHAB PHASE I   PRE:  Rate/Rhythm: 76 SR  BP:  Supine:  Sitting: 126/70  Standing:   SaO2: 97 RA  MODE:  Ambulation: 550 ft   POST:  Rate/Rhythem: 90 SR  BP:  Supine:   Sitting: 130/76  Standing:    SaO2: 95 RA  [name] [name]  Pt ambulated 550 ft assist x 1 with IV pole. Tolerated well. Slow walker but seemed steady. Returned to bedside. VSS. Will follow up Monday. Electronically signed by Thea Silversmith MS on Saturday July 03 2012 at 1123

## 2012-07-03 NOTE — Progress Notes (Signed)
INTERNAL MEDICINE TEACHING SERVICE Attending Note  Date: 07/03/2012  Patient name: [name] [name]  Medical record number: [id number]  Date of birth: 05/29/1951    This patient has been seen and discussed with the house staff. Please see their note for complete details. I concur with their findings with the following additions/corrections: Feels well today. No CP, no SOB. Admits to some urinary urgency.   A/P: 52 yr. Old AAF w/ hx HL, HTN, IDDM type 2 (? No consistent history with regards to insulin use), hx CVA, crack cocaine use, presented with chest pain, s/p NSTEMI.  1. NSTEMI: Continue heparin gtt as recommended by Card, ASA, statin, ACE-i. NTG SL as needed for CP. Stable and CP free. 2. IDDM type 2: Poorly controlled. Adjust insulin per resident note. 3. Leukocytosis: Source unclear. She is noted to have trichomonas in urine w/ large leukocytes.  She is afebrile. Agree w/ treatment with Flagyl 2 g PO x1. Monitor and repeat CBC in AM. 4. HTN: Continue current tx w/ lisinopril and coreg. 5. Pre-renal/recent AKI: This is resolved, monitor in setting of ACE-1. 6. Rest per resident physician note.   Dominic Pea, DO  07/03/2012, 6:02 PM

## 2012-07-03 NOTE — Progress Notes (Signed)
ANTICOAGULATION CONSULT NOTE - Follow Up Consult  Pharmacy Consult for Heparin Indication: Chest pain, VTE prophylaxis pending planned CABG  No Known Allergies  Patient Measurements: Height: 5\' 8"  (172.7 cm) Weight: 263 lb 3.7 oz (119.4 kg) IBW/kg (Calculated) : 63.9  Heparin Dosing Weight: 92 kg  Vital Signs: Temp: 99.9 F (37.7 C) (07/06 0407) Temp src: Oral (07/06 0407) BP: 111/38 mmHg (07/06 0407) Pulse Rate: 82  (07/06 0407)  Labs:  Basename 07/03/12 0703 07/02/12 1931 07/02/12 0540 07/01/12 1010 07/01/12 0455  HGB 11.7* -- 12.3 -- --  HCT 35.2* -- 37.4 -- 37.8  PLT 216 -- 227 -- 228  APTT -- -- -- -- --  LABPROT -- -- -- -- --  INR -- -- -- -- --  HEPARINUNFRC 0.50 0.45 0.26* -- --  CREATININE 1.13* -- -- 1.11* 1.05  CKTOTAL -- -- -- -- --  CKMB -- -- -- -- --  TROPONINI -- -- -- -- --    Estimated Creatinine Clearance: 71.1 ml/min (by C-G formula based on Cr of 1.13).   Medications:  Infusions:    . sodium chloride 10 mL/hr (06/29/12 2212)  . heparin 1,450 Units/hr (07/02/12 2213)  . nitroGLYCERIN Stopped (07/02/12 0815)    Assessment:  61 y/o female s/p recent NSTEMI currently on Heparin infusion until planned CABG next week  Goal of Therapy:   Heparin level 0.3-0.7 units/ml   Plan:   Continue Heparin infusion at 1450 units/hr  Daily Heparin level and CBC while on Heparin.   Ahley Bulls, Tereso Newcomer.D 07/03/2012,11:43 AM

## 2012-07-03 NOTE — Progress Notes (Addendum)
Resident Addendum to Medical Student Note   I have seen and examined the patient, and reviewed the daily progress note by Andrey Farmer, MSIV and discussed the care of the patient with them. See below for documentation of my findings, assessment, and plans.   Subjective:    Currently, the patient has no chest pain, shortness of breath, difficulty breathing, palpitations. She does complain of constipation and would like some help with relief. As well, she indicates increased urgency of urination this morning. But is otherwise feeling well.  Interval Events: None   Objective:    VS: Reviewed as documented in electronic medical record  Meds: Reviewed as documented in electronic medical record  Labs: Reviewed as documented in electronic medical record  Imaging: Reviewed as documented in electronic medical record   Physical Exam: General: Vital signs reviewed and noted. Well-developed, well-nourished, in no acute distress; alert, appropriate and cooperative throughout examination.  Lungs:  Normal respiratory effort. Clear to auscultation BL without crackles or wheezes.  Heart: RRR. S1 and S2 normal without gallop, murmur, or rubs.  Abdomen:  BS normoactive. Soft, Nondistended, non-tender.  No masses or organomegaly.  Extremities: No pretibial edema.    Assessment/ Plan:   Pt is a 61 y.o. yo female with a PMHx of uncontrolled DMII (A1c 10.5), HTN, cocaine abuse who was admitted on 06/29/2012 with symptoms of chest pain after cocaine use and also found to have profound hyperglycemia with admission CBG > 700 mg/dL. Admission troponin was > 17, diagnosed with NSTEMI, she was started on nitroglycerin and heparin gtt, and loaded with brilinta. Cardiac cath on 7/2 revealed severe 3 vessel disease (80% focal stenosis in proximal LAD, left circumflex with mid circumflex with a focal 80% ulcerated stenosis, and RCA with 40-50% diffuse disease in proximal vessel). These lesions were not amenable to  PCI alone, therefore, she is planned for probable CABG in the upcoming week.  1. NSTEMI - in setting of cocaine abuse, and severe underlying 3 vessel disease evidence on cardiac catheterization on 07/02. Currently, patient remains on heparin and nitroglycerin drip, which are adequately controlling her chest pain, in anticipation of possible CABG in the up coming week.  - Appreciate cardiology and CVTS assistance and input in the management of our patient.   - Continue heparin drip - will need to be continued until surgery (as recommended by cardiology, Dr. Stanford Breed) - Continue aspirin, heparin, statin, and ACE-I. - Continue nitro paste and SL NTG PRN - Continue Coreg. - Await CABG in upcoming week.   2. Hyperglycemia in setting of baseline poorly controlled DMII (A1c of 10.5 on admission) - CBG remains elevated throughout the day, likely resultant from decreasing the SSI from resistant to moderate. Initially started on insulin drip, which subsequently was changed to basal lantus with meal coverage and SSI.  - Will continue current Lantus at 45 units. Slightly increase meal coverage to 9 units, and adjust to resistant sliding scale.  - Counseled to AVOID eating outside food that her family is bring in.  - Diabetic diet.  3. Leukocytosis - patient is noted to have slight elevation in her white count this morning to 13 - at this point, there is no clear cause and she remains afebrile. However, she does indicate increased frequency/ urgency of her urine, therefore, in setting of uncontrolled DMII, UTI possible. - Will check UA with micro. - Will continue to trend and monitor for signs/ symptoms of infection.  4. Hyperlipidemia - LDL 154, goal LDL <  70 mg/dL given concurrent DMII and severe CAD.  Noncompliance with home pravastatin prior to admission. - Changed to Lipitor during this hospitalization. - Will plan to recheck FLP in approximately 6 weeks as an outpatient to assess if goal LDL achieved  with this therapy, and will alternatively adjust as indicated.  5. Normocytic anemia - Hemoglobin 11.7 today, slightly decreased from yesterday. Possible AOCD secondary to DMII (Anemia panel showing iron 54, TIBC 230, saturation ratio 23 and ferritin 266, which is not suggestive of iron deficiency anemia. As well, FOBT negative.) However, given newly started on heparin gtt, will need to continue to monitor closely.   - Closely monitor CBC, signs of bleeding.  6. Constipation  - Will add Miralax and Senna-S - hold for loose stools.   7. Hypertension - On nitro paste, coreg, lisinopril.  - Continue nitro paste. - continue Lisinopril and Coreg.  8. Acute Kidney Injury - Resolved. On admission, prerenal azotemia present, which resolved. On 07/04 Mild bump in Cr to 1.1, in setting of initiating an ACE-I, which can be expected, and is ok at this point. This slight increase is stable.  - continue to monitor BMET.   9. Cocaine abuse - Provided education regarding cessation.  10 . DVT Prophylaxis -  Full dose heparin   11. Dispo - Deferred for now, until surgery can be planned for next week for CABG.    Length of Stay: 4 days   Signed: Chilton Greathouse, Leonel Ramsay, Internal Medicine Resident Pager: [phone] (7AM-5PM) 07/03/2012, 8:44 AM

## 2012-07-03 NOTE — Progress Notes (Signed)
SUBJECTIVE:  She denies chest pain.  No SOB   PHYSICAL EXAM Filed Vitals:   07/02/12 0429 07/02/12 1300 07/02/12 2010 07/03/12 0407  BP: 123/71 121/70 124/84 111/38  Pulse: 74 75 74 82  Temp: 99.4 F (37.4 C) 99.7 F (37.6 C) 99.6 F (37.6 C) 99.9 F (37.7 C)  TempSrc: Oral  Oral Oral  Resp: 18 18 18 16   Height:      Weight: 259 lb 14.8 oz (117.9 kg)   263 lb 3.7 oz (119.4 kg)  SpO2: 94% 95% 97% 95%   General:  No distress Lungs:  Clear Heart:  RRR Abdomen:  Positive bowel sounds, no rebound no guarding Extremities:  No edema  LABS:  Results for orders placed during the hospital encounter of 06/29/12 (from the past 24 hour(s))  GLUCOSE, CAPILLARY     Status: Abnormal   Collection Time   07/02/12 11:09 AM      Component Value Range   Glucose-Capillary 190 (*) 70 - 99 mg/dL  GLUCOSE, CAPILLARY     Status: Abnormal   Collection Time   07/02/12  4:37 PM      Component Value Range   Glucose-Capillary 173 (*) 70 - 99 mg/dL   Comment 1 Documented in Chart     Comment 2 Notify RN    HEPARIN LEVEL (UNFRACTIONATED)     Status: Normal   Collection Time   07/02/12  7:31 PM      Component Value Range   Heparin Unfractionated 0.45  0.30 - 0.70 IU/mL  GLUCOSE, CAPILLARY     Status: Abnormal   Collection Time   07/02/12  9:12 PM      Component Value Range   Glucose-Capillary 203 (*) 70 - 99 mg/dL   Comment 1 Documented in Chart     Comment 2 Notify RN    GLUCOSE, CAPILLARY     Status: Abnormal   Collection Time   07/03/12  6:08 AM      Component Value Range   Glucose-Capillary 276 (*) 70 - 99 mg/dL  HEPARIN LEVEL (UNFRACTIONATED)     Status: Normal   Collection Time   07/03/12  7:03 AM      Component Value Range   Heparin Unfractionated 0.50  0.30 - 0.70 IU/mL  CBC     Status: Abnormal   Collection Time   07/03/12  7:03 AM      Component Value Range   WBC 13.1 (*) 4.0 - 10.5 K/uL   RBC 4.48  3.87 - 5.11 MIL/uL   Hemoglobin 11.7 (*) 12.0 - 15.0 g/dL   HCT 35.2 (*) 36.0 -  46.0 %   MCV 78.6  78.0 - 100.0 fL   MCH 26.1  26.0 - 34.0 pg   MCHC 33.2  30.0 - 36.0 g/dL   RDW 16.9 (*) 11.5 - 15.5 %   Platelets 216  150 - 400 K/uL  BASIC METABOLIC PANEL     Status: Abnormal   Collection Time   07/03/12  7:03 AM      Component Value Range   Sodium 137  135 - 145 mEq/L   Potassium 4.0  3.5 - 5.1 mEq/L   Chloride 104  96 - 112 mEq/L   CO2 26  19 - 32 mEq/L   Glucose, Bld 253 (*) 70 - 99 mg/dL   BUN 17  6 - 23 mg/dL   Creatinine, Ser 1.13 (*) 0.50 - 1.10 mg/dL   Calcium 8.6  8.4 -  10.5 mg/dL   GFR calc non Af Amer 51 (*) >90 mL/min   GFR calc Af Amer 60 (*) >90 mL/min    Intake/Output Summary (Last 24 hours) at 07/03/12 1047 Last data filed at 07/03/12 0641  Gross per 24 hour  Intake    891 ml  Output    350 ml  Net    541 ml    ASSESSMENT AND PLAN:   1) S/P MI -  3 vessel CAD. Plan continue ASA, heparin, NTG, statin and ACEI. Continue coreg 6.25 mg po BID. For CABG next week.   2) Ischemic CM -Continue ACEI and coreg as outlined. Of note the echo report indicates an EF of 55%.  This is much different than the 25% reported at cath.      Jeneen Rinks [facility] 07/03/2012 10:47 AM

## 2012-07-03 NOTE — Progress Notes (Signed)
Patient ID: TAIESHA CARE, female   DOB: 1951-01-26, 61 y.o.   MRN: [id number] Medical Student Daily Progress Note  Subjective: Patient mentions trouble sleeping but no chest pain, shortness of breath, nausea, vomiting, or any other symptoms.  Interval events: No acute events overnight.  Objective: Vital signs in last 24 hours: Filed Vitals:   07/02/12 0429 07/02/12 1300 07/02/12 2010 07/03/12 0407  BP: 123/71 121/70 124/84 111/38  Pulse: 74 75 74 82  Temp: 99.4 F (37.4 C) 99.7 F (37.6 C) 99.6 F (37.6 C) 99.9 F (37.7 C)  TempSrc: Oral  Oral Oral  Resp: 18 18 18 16   Height:      Weight: 117.9 kg (259 lb 14.8 oz)   119.4 kg (263 lb 3.7 oz)  SpO2: 94% 95% 97% 95%   Weight change: 1.5 kg (3 lb 4.9 oz)  Intake/Output Summary (Last 24 hours) at 07/03/12 0744 Last data filed at 07/03/12 0641  Gross per 24 hour  Intake   1131 ml  Output    350 ml  Net    781 ml   Physical Exam: BP 111/38  Pulse 82  Temp 99.9 F (37.7 C) (Oral)  Resp 16  Ht 5\' 8"  (1.727 m)  Wt 119.4 kg (263 lb 3.7 oz)  BMI 40.02 kg/m2  SpO2 95%  General Appearance:    Alert, cooperative, no distress, appears stated age  Head:    Normocephalic, without obvious abnormality, atraumatic  Back:     Symmetric, no curvature, ROM normal, no CVA tenderness  Lungs:     Clear to auscultation bilaterally, respirations unlabored  Chest Wall:    No tenderness or deformity   Heart:    Regular rate and rhythm, S1 and S2 normal, no murmur, rub   or gallop   Lab Results: @labtest2 @ Micro Results: Recent Results (from the past 240 hour(s))  MRSA PCR SCREENING     Status: Normal   Collection Time   06/29/12 12:26 PM      Component Value Range Status Comment   MRSA by PCR NEGATIVE  NEGATIVE Final    Studies/Results: Dg Chest 2 View  07/01/2012  *RADIOLOGY REPORT*  Clinical Data: Coronary disease, preop evaluation for bypass  CHEST - 2 VIEW  Comparison: 72,013  Findings: Normal heart size and vascularity.  Resolved  mild edema pattern.  No current CHF, pneumonia, effusion or pneumothorax. Trachea midline.  IMPRESSION: Resolving edema.  Original Report Authenticated By: Jerilynn Mages. Daryll Brod, M.D.   Medications: I have reviewed the patient's current medications. Scheduled Meds:   . albuterol  2.5 mg Nebulization Once  . aspirin EC  325 mg Oral Daily  . atorvastatin  80 mg Oral q1800  . brinzolamide  1 drop Both Eyes TID  . carvedilol  6.25 mg Oral BID WC  . insulin aspart  0-20 Units Subcutaneous TID WC  . insulin aspart  0-5 Units Subcutaneous QHS  . insulin aspart  9 Units Subcutaneous TID WC  . insulin glargine  45 Units Subcutaneous Daily  . latanoprost  1 drop Both Eyes QHS  . lisinopril  2.5 mg Oral Daily  . nitroGLYCERIN  2 inch Topical Q6H  . pantoprazole  40 mg Oral Q0600   Continuous Infusions:   . sodium chloride 10 mL/hr (06/29/12 2212)  . heparin 1,450 Units/hr (07/02/12 2213)  . nitroGLYCERIN Stopped (07/02/12 0815)   PRN Meds:.acetaminophen, dextrose, LORazepam, morphine injection, nitroGLYCERIN, ondansetron (ZOFRAN) IV  Assessment/Plan:  This is a 61 y/o woman with  history of CVA, DM, HTN, HLD, and cocaine admitted on 07/02 with CP (in setting of cocaine) and BG greater than 700.Trop was 17 on admission and EKG showed T wave inversion. She was diagnosed with a NSTEMI and was emergently cathed revealing severe three vessel disease and an EF of 25%. She was started on a nitro and heparin drip. It was determined that she was not amenable to PCI and thus CABG has been planned for the following week.. She is a type 2 diabetic with uncontrolled diabetes and so she was placed on a regimented insulin plan.   1. NSTEMI (non-ST elevated myocardial infarction)- CT surgery has spoken to patient and family and will likely have CABG sometime next week. Patient was initially on Nitro drip but it was weaned and with Cards recommendation,Nitro paste 2 inches q6 was started with it being held from 12 am to  6 am. Patient also has Nitro PRN if needed. Heparin drip is continued and is being managed by pharmacy and Cards. Initial EF of 25% was measured during cath procedure 4 days ago but the most recent echo reveals an improved EF of 55%. The patient is on aspirin.   2. Hyperglycemia- Most recent BG is 253. Yesterday her initial insulin regiment was Lantus 45, prandial aspart 4 units, and SSI resistant coverage. Her Lantus was kept at 45 but prandial was changed to aspart 8 units with SSI moderate coverage. She continued to require SSI on the new regiment and her BG's trended upwards so her insulin regiment was changed to Lantus 45 units, prandial aspart 9 units, and SSI resistant coverage. Will continue to monitor BG's and make adjustments as necessary.   3. ACE-I induced creatinine bump- A small creatinine bump associated with addition of lisinopril. Will continue to monitor.  4. Leukocytosis- White count trend has been 11.3 --> 9.5 --> 13.1 . No fevers or complaints of chills. Urinary catheter has been removed and there are no signs of any phlebitis. Will continue to monitor.   5. Hyperlipidemia- On lipitor 80 mg. Target LDL level will be <70 due to CHD, diabetes, and hyperlipidemia. Will continue to encourage medication compliance.   6. Hypertension- On Nitro paste 2 in. q6 with oral medications Coreg and lisinopril.   7. Cocaine abuse- Cocaine abuse causes sympathetic activation via inhibition of norepinephrine uptake which leads to over excitation of the heart and increased oxygen consumption of the heart muscle. This is one possible cause of MI's in individuals who use cocaine. Cocaine is a potent vasoconstrictor and can cause vasospasm of the arteries which can lead to ACS. Cocaine can also promote thrombus formation. Will stress these and other dangers of cocaine abuse to the patient and will discuss treatment options at later date.   8. RBC microcytosis- Hgb 11.7 and Iron studies are normal. Will  continue to clinically assess for any signs of anemia.   9. Prophy- IV Heparin drip   10. Dispo- Awaiting CT surgery recs in regards to CABG date.    LOS: 4 days   This is a Careers information officer Note.  The care of the patient was discussed with Dr. Guy Sandifer and the assessment and plan formulated with their assistance.  Please see their attached note for official documentation of the daily encounter.  Andrey Farmer T 07/03/2012, 7:44 AM

## 2012-07-04 LAB — CBC WITH DIFFERENTIAL/PLATELET
Basophils Absolute: 0 10*3/uL (ref 0.0–0.1)
Eosinophils Relative: 3 % (ref 0–5)
HCT: 34.9 % — ABNORMAL LOW (ref 36.0–46.0)
Hemoglobin: 11.5 g/dL — ABNORMAL LOW (ref 12.0–15.0)
Lymphocytes Relative: 27 % (ref 12–46)
MCHC: 33 g/dL (ref 30.0–36.0)
MCV: 78.4 fL (ref 78.0–100.0)
Monocytes Absolute: 1.2 10*3/uL — ABNORMAL HIGH (ref 0.1–1.0)
Monocytes Relative: 10 % (ref 3–12)
RDW: 17 % — ABNORMAL HIGH (ref 11.5–15.5)
WBC: 12.4 10*3/uL — ABNORMAL HIGH (ref 4.0–10.5)

## 2012-07-04 LAB — GLUCOSE, CAPILLARY
Glucose-Capillary: 226 mg/dL — ABNORMAL HIGH (ref 70–99)
Glucose-Capillary: 118 mg/dL — ABNORMAL HIGH (ref 70–99)
Glucose-Capillary: 202 mg/dL — ABNORMAL HIGH (ref 70–99)

## 2012-07-04 MED ORDER — INSULIN GLARGINE 100 UNIT/ML ~~LOC~~ SOLN
47.0000 [IU] | Freq: Every day | SUBCUTANEOUS | Status: DC
  Administered 2012-07-04: 21:00:00 via SUBCUTANEOUS
  Administered 2012-07-06 – 2012-07-07 (×3): 47 [IU] via SUBCUTANEOUS

## 2012-07-04 MED ORDER — INSULIN ASPART 100 UNIT/ML ~~LOC~~ SOLN
11.0000 [IU] | Freq: Three times a day (TID) | SUBCUTANEOUS | Status: DC
  Administered 2012-07-04 – 2012-07-08 (×14): 11 [IU] via SUBCUTANEOUS

## 2012-07-04 NOTE — Progress Notes (Signed)
Resident Addendum to Medical Student Note   I have seen and examined the patient, and reviewed the daily progress note by Andrey Farmer, MSIV and discussed the care of the patient with them. See below for documentation of my findings, assessment, and plans.   Subjective:    Currently, the patient has no chest pain, shortness of breath, difficulty breathing, palpitations. She does complain of constipation and would like some help with relief. As well, she indicates increased urgency of urination this morning. But is otherwise feeling well.  Interval Events: None   Objective:    VS: Reviewed as documented in electronic medical record  Meds: Reviewed as documented in electronic medical record  Labs: Reviewed as documented in electronic medical record  Imaging: Reviewed as documented in electronic medical record   Physical Exam: General: Vital signs reviewed and noted. Well-developed, well-nourished, in no acute distress; alert, appropriate and cooperative throughout examination.  Lungs:  Normal respiratory effort. Clear to auscultation BL without crackles or wheezes.  Heart: RRR. S1 and S2 normal without gallop, murmur, or rubs.  Abdomen:  BS normoactive. Soft, Nondistended, non-tender.  No masses or organomegaly.  Extremities: No pretibial edema.    Assessment/ Plan:   Pt is a 61 y.o. yo female with a PMHx of uncontrolled DMII (A1c 10.5), HTN, cocaine abuse who was admitted on 06/29/2012 with symptoms of chest pain after cocaine use and also found to have profound hyperglycemia with admission CBG > 700 mg/dL. Admission troponin was > 17, diagnosed with NSTEMI, she was started on nitroglycerin and heparin gtt, and loaded with brilinta. Cardiac cath on 7/2 revealed severe 3 vessel disease (80% focal stenosis in proximal LAD, left circumflex with mid circumflex with a focal 80% ulcerated stenosis, and RCA with 40-50% diffuse disease in proximal vessel). These lesions were not amenable to  PCI alone, therefore, she is planned for probable CABG in the upcoming week.  1. NSTEMI - in setting of cocaine abuse, and severe underlying 3 vessel disease evidence on cardiac catheterization on 07/02. Currently, patient remains on heparin and nitroglycerin drip, which are adequately controlling her chest pain, in anticipation of possible CABG in the up coming week.  - Appreciate cardiology and CVTS assistance and input in the management of our patient.   - Continue heparin drip - will need to be continued until surgery (as recommended by cardiology, Dr. Stanford Breed) - Continue aspirin, heparin, statin, and ACE-I. - Continue nitro paste and SL NTG PRN - Continue Coreg. - Await CABG in upcoming week.   2. Hyperglycemia in setting of baseline poorly controlled DMII (A1c of 10.5 on admission) - CBG remains elevated throughout the day, particularly in the morning time. This may reflect that although theoretically Lantus should act for 24 hours, may not always do so in all patients.   - Will change Lantus to 47 units, and changed to each bedtime dosing. - Increase meal coverage to 11 units, she is requiring up to 16 units with meals. - Counseled to AVOID eating outside food that her family is bring in.  - Diabetic diet.  3. Trichomonas infection - the patient has been informed of the infection, and need for her partner to be treated as well. She received 2 g of Flagyl on 07/06. Likely at least partly accounts for her leukocytosis. - No further treatment needed at this time. - Patient informed to have partner treated.  4. Leukocytosis - WBC count 13 (07/06) --> downtrending today with WBC count of 12.4. Etiology  still not completely clear, maybe at least partially contributed by her Trichomonas infection, however, she may also have a UTI in setting of her increased urgency of her urine, in setting of uncontrolled DMII. She will need to be monitored closely for any indication of infection. - Pending  urine culture, will follow results. - Will continue to trend and monitor for signs/ symptoms of infection.  5. Normocytic anemia - Hemoglobin 11.5 today, slightly decreased from yesterday. Possible AOCD secondary to DMII (Anemia panel showing iron 54, TIBC 230, saturation ratio 23 and ferritin 266, which is not suggestive of iron deficiency anemia. As well, FOBT negative.) However, given newly started on heparin gtt, will need to continue to monitor closely.   - Closely monitor CBC, signs of bleeding.  6. Constipation  - Continue Miralax and Senna-S - hold for loose stools.   7. Hypertension - On nitro paste, coreg, lisinopril.  - Continue nitro paste. - continue Lisinopril and Coreg.  8. Acute Kidney Injury - Resolved. On admission, prerenal azotemia present, which resolved. On 07/04 Mild bump in Cr to 1.1, in setting of initiating an ACE-I, which can be expected, and is ok at this point. This slight increase is stable.  - continue to monitor BMET.   9. Cocaine abuse - Provided education regarding cessation.  10 . Hyperlipidemia - LDL 154, goal LDL < 70 mg/dL given concurrent DMII and severe CAD.  Noncompliance with home pravastatin prior to admission. - Changed to Lipitor during this hospitalization. - Will plan to recheck FLP in approximately 6 weeks as an outpatient to assess if goal LDL achieved with this therapy, and will alternatively adjust as indicated.  11. DVT Prophylaxis -  Full dose heparin   12. Dispo - Deferred for now, until surgery can be planned for next week for CABG.    Length of Stay: 5 days   Signed: Chilton Greathouse, Jacelyn Pi, Internal Medicine Resident Pager: [phone] (7AM-5PM) 07/04/2012, 10:01 AM

## 2012-07-04 NOTE — Progress Notes (Signed)
ANTICOAGULATION CONSULT NOTE - Follow Up Consult  Pharmacy Consult for Heparin Indication: Chest pain, VTE prophylaxis pending planned CABG  No Known Allergies  Patient Measurements: Height: 5\' 8"  (172.7 cm) Weight: 268 lb 6.4 oz (121.745 kg) IBW/kg (Calculated) : 63.9  Heparin Dosing Weight: 92 kg  Vital Signs: Temp: 99.9 F (37.7 C) (07/07 0416) Temp src: Oral (07/07 0416) BP: 112/68 mmHg (07/07 0416) Pulse Rate: 68  (07/07 0416)  Labs:  Basename 07/04/12 0635 07/03/12 0703 07/02/12 1931 07/02/12 0540  HGB 11.5* 11.7* -- --  HCT 34.9* 35.2* -- 37.4  PLT 241 216 -- 227  APTT -- -- -- --  LABPROT -- -- -- --  INR -- -- -- --  HEPARINUNFRC 0.42 0.50 0.45 --  CREATININE -- 1.13* -- --  CKTOTAL -- -- -- --  CKMB -- -- -- --  TROPONINI -- -- -- --    Estimated Creatinine Clearance: 71.8 ml/min (by C-G formula based on Cr of 1.13).  Medications:  Infusions:    . sodium chloride 10 mL/hr (06/29/12 2212)  . heparin 1,450 Units/hr (07/04/12 [id number])  . nitroGLYCERIN Stopped (07/02/12 0815)   Assessment:  61 y/o female s/p recent NSTEMI currently on Heparin infusion until planned CABG later this week  Heparin level remains within therapeutic range.  Goal of Therapy:   Heparin level 0.3-0.7 units/ml  Plan:   Continue Heparin infusion at 1450 units/hr.  Daily Heparin level and CBC while on Heparin.  Cloria Spring, Tereso Newcomer.D  07/04/2012,11:12 AM

## 2012-07-04 NOTE — Progress Notes (Signed)
INTERNAL MEDICINE TEACHING SERVICE Attending Note  Date: 07/04/2012  Patient name: [name] [name]  Medical record number: [id number]  Date of birth: 30-Mar-1951    This patient has been seen and discussed with the house staff. Please see their note for complete details. I concur with their findings with the following additions/corrections: Feels well. No CP, no SOB, no palpitations. Has no complaints for me.  A/P: 28 yr. Old AAF w/ hx HL, HTN, IDDM type 2 (? No consistent history with regards to insulin use), hx CVA, crack cocaine use, presented with chest pain, s/p NSTEMI.  1. NSTEMI: Continue heparin gtt as recommended by Card, ASA, statin, ACE-i. NTG SL as needed for CP. Stable and CP free. 2. IDDM type 2: Poorly controlled. Adjust insulin per resident note, change to QHS dosing. 3. Leukocytosis: Source unclear. She is noted to have trichomonas in urine w/ large leukocytes. She is afebrile. Ttreatment with Flagyl 2 g PO x1. Monitor and repeat CBC in AM. 4. HTN: Continue current tx w/ lisinopril and coreg. 5. Pre-renal/recent AKI: This is resolved, monitor in setting of ACE-1. 6. Rest per resident physician note   Dominic Pea, DO  07/04/2012, 1:16 PM

## 2012-07-04 NOTE — Progress Notes (Signed)
Patient ID: [name] [name], female   DOB: 1951/10/26, 61 y.o.   MRN: [id number] Medical Student Daily Progress Note  Subjective: Patient states that she slept well and that she has no complaints. No chest pain, nausea, vomiting, shortness of breath, or any other symptoms.   Interval events: No acute events overnight. Objective: Vital signs in last 24 hours: Filed Vitals:   07/03/12 1412 07/03/12 1751 07/03/12 2100 07/04/12 0416  BP: 99/63 108/60 121/77 112/68  Pulse: 68  78 68  Temp: 99.1 F (37.3 C)  99.7 F (37.6 C) 99.9 F (37.7 C)  TempSrc: Oral  Oral Oral  Resp: 18  19 20   Height:      Weight:    121.745 kg (268 lb 6.4 oz)  SpO2: 93%  98% 98%   Weight change: 2.345 kg (5 lb 2.7 oz)  Intake/Output Summary (Last 24 hours) at 07/04/12 1256 Last data filed at 07/04/12 0900  Gross per 24 hour  Intake    240 ml  Output    500 ml  Net   -260 ml   Physical Exam: BP 112/68  Pulse 68  Temp 99.9 F (37.7 C) (Oral)  Resp 20  Ht 5\' 8"  (1.727 m)  Wt 121.745 kg (268 lb 6.4 oz)  BMI 40.81 kg/m2  SpO2 98%  General Appearance:    Alert, cooperative, no distress, appears stated age  Head:    Normocephalic, without obvious abnormality, atraumatic  Back:     Symmetric, no curvature, ROM normal, no CVA tenderness  Lungs:     Clear to auscultation bilaterally, respirations unlabored  Chest Wall:    No tenderness or deformity   Heart:  Extremities:   Regular rate and rhythm, S1 and S2 normal, no murmur, rub   or gallop   No edema    Lab Results: @labtest2 @ Micro Results: Recent Results (from the past 240 hour(s))  MRSA PCR SCREENING     Status: Normal   Collection Time   06/29/12 12:26 PM      Component Value Range Status Comment   MRSA by PCR NEGATIVE  NEGATIVE Final    Studies/Results: No results found. Medications: I have reviewed the patient's current medications. Scheduled Meds:   . aspirin EC  325 mg Oral Daily  . atorvastatin  80 mg Oral q1800  . brinzolamide  1  drop Both Eyes TID  . carvedilol  6.25 mg Oral BID WC  . insulin aspart  0-20 Units Subcutaneous TID WC  . insulin aspart  0-5 Units Subcutaneous QHS  . insulin aspart  11 Units Subcutaneous TID WC  . insulin glargine  47 Units Subcutaneous QHS  . latanoprost  1 drop Both Eyes QHS  . lisinopril  2.5 mg Oral Daily  . metroNIDAZOLE  2,000 mg Oral Once  . nitroGLYCERIN  2 inch Topical Q6H  . pantoprazole  40 mg Oral Q0600  . polyethylene glycol  17 g Oral Daily  . senna-docusate  1 tablet Oral BID   Continuous Infusions:   . sodium chloride 10 mL/hr (06/29/12 2212)  . heparin 1,450 Units/hr (07/04/12 [id number])  . nitroGLYCERIN Stopped (07/02/12 0815)   PRN Meds:.acetaminophen, dextrose, LORazepam, morphine injection, nitroGLYCERIN, ondansetron (ZOFRAN) IV  Assessment/Plan:  This is a 61 y/o woman with history of CVA, DM, HTN, HLD, and cocaine admitted on 07/02 with CP (in setting of cocaine) and BG greater than 700.Trop was 17 on admission and EKG showed T wave inversion. She was diagnosed with a NSTEMI  and was emergently cathed revealing severe three vessel disease and an EF of 25%. She was started on a nitro and heparin drip. It was determined that she was not amenable to PCI and thus CABG has been planned for the following week.. She is a type 2 diabetic with uncontrolled diabetes and so she was placed on a regimented insulin plan.   1. NSTEMI (non-ST elevated myocardial infarction)- CT surgery has spoken to patient and family and will likely have CABG sometime this week. Patient was initially on Nitro drip but it was weaned and with Cards recommendation,Nitro paste 2 inches q6 was started with it being held from 12 am to 6 am. Patient also has Nitro PRN if needed. Heparin drip is continued and is being managed by pharmacy and Cards. Initial EF of 25% was measured during cath procedure 4 days ago but the most recent echo reveals an improved EF of 55%. The patient is on aspirin.  -Heparin  drip  -Aspirin  -Nitro paste, Coreg and Lisinopril  -Await CABG   2. Hyperglycemia- Most recent BG is 118. Yesterday her initial insulin regiment was Lantus 45, prandial aspart 9 units, and SSI resistant coverage. She continued to have hyperglycemia episodes throughout the day [id number]) and requiring significant SSI coverage. Her Lantus was changed to 47 since she was consistently hyperglycemic and her prandial was changed to 11 to better cover her during meals. Her SSI was left the same. Also, she was receiving her Lantus dose in the AM so this morning's dose was held and will be switched to evening dosing. Will continue to monitor BG's and make adjustments as necessary.  -Lantus 47 units at night  -Prandial 11 units with SSI  -Continue to monitor BG's   3. ACE-I induced creatinine bump- Resolved. Will not continue routine BMP's but will continue to assess for any clinical symptoms such as decreased urine output and edema.  -Will monitor for clinical symptoms   4. Leukocytosis- White count trend has been 11.3 --> 9.5 --> 13.1 --> 12.6 . No fevers or complaints of chills. Urinary catheter has been removed and there are no signs of any phlebitis around cardiac cath site. Unclear as to the cause of the leukocytosis, it's possible that it's from the UTI but will continue to monitor through routine CBC's.  -Etiology unclear, possibly 2/2 UTI  -Will continue to monitor  5. UTI- UA yesterday revealed leukocytes and Trichomonas. One dose of Flagyl 2 g was given for treatment. Patient was counseled on trichomonas and it's mode of transmission. Patient states that she's been in a monogamous relationship for 17 years. Patient was also counseled to tell partner that he would need treatment for trichomonas as well.  -Treated with one dose Flagyl 2 grams  -Counseled patient that partner will need to get treated    6. Hyperlipidemia- On lipitor 80 mg. Target LDL level will be <70 due to CHD, diabetes, and  hyperlipidemia. Will continue to encourage medication compliance.  -On lipitor 80 mg  -Monitor as outpatient.   7. Hypertension- On Nitro paste 2 in. q6 with oral medications Coreg and lisinopril   8. Cocaine abuse- Cocaine abuse causes sympathetic activation via inhibition of norepinephrine uptake which leads to over excitation of the heart and increased oxygen consumption of the heart muscle. This is one possible cause of MI's in individuals who use cocaine. Cocaine is a potent vasoconstrictor and can cause vasospasm of the arteries which can lead to ACS. Cocaine can also promote thrombus  formation. Will stress these and other dangers of cocaine abuse to the patient and will discuss treatment options at later date.   9. RBC microcytosis- Hgb 11.7 and Iron studies are normal. Will continue to clinically assess for any signs of anemia.   10. Prophy- IV Heparin drip   11. Dispo- Awaiting CT surgery recs in regards to CABG date.   LOS: 5 days   This is a Careers information officer Note.  The care of the patient was discussed with Dr. Guy Sandifer and the assessment and plan formulated with their assistance.  Please see their attached note for official documentation of the daily encounter.  Andrey Farmer T 07/04/2012, 12:56 PM

## 2012-07-04 NOTE — Progress Notes (Signed)
RESIDENT ADDENDUM TO MEDICAL STUDENT NOTE  I have seen and examined the patient, and reviewed the daily progress note by Andrey Farmer, MS IV and discussed the care of the patient with them. Please see my progress note from 07/04/2012 for further details regarding assessment and plan.    Signed: Chilton Greathouse, Jacelyn Pi, Internal Medicine Resident Pager: [phone] (7AM-5PM) 07/04/2012, 1:45 PM

## 2012-07-05 DIAGNOSIS — I219 Acute myocardial infarction, unspecified: Secondary | ICD-10-CM

## 2012-07-05 LAB — CBC WITH DIFFERENTIAL/PLATELET
Basophils Absolute: 0 10*3/uL (ref 0.0–0.1)
Basophils Relative: 0 % (ref 0–1)
Eosinophils Absolute: 0.3 10*3/uL (ref 0.0–0.7)
Hemoglobin: 12 g/dL (ref 12.0–15.0)
MCH: 26 pg (ref 26.0–34.0)
MCHC: 33 g/dL (ref 30.0–36.0)
Neutro Abs: 6.2 10*3/uL (ref 1.7–7.7)
Neutrophils Relative %: 60 % (ref 43–77)
Platelets: 281 10*3/uL (ref 150–400)
RDW: 17.3 % — ABNORMAL HIGH (ref 11.5–15.5)

## 2012-07-05 LAB — URINE CULTURE: Colony Count: 35000

## 2012-07-05 LAB — GLUCOSE, CAPILLARY: Glucose-Capillary: 123 mg/dL — ABNORMAL HIGH (ref 70–99)

## 2012-07-05 LAB — HEPARIN LEVEL (UNFRACTIONATED): Heparin Unfractionated: 0.4 IU/mL (ref 0.30–0.70)

## 2012-07-05 NOTE — Progress Notes (Signed)
Subjective No Chest pain or dyspnea.   Objective:  Filed Vitals:   07/04/12 0416 07/04/12 1410 07/04/12 2029 07/05/12 0424  BP: 112/68 95/53 120/74 130/76  Pulse: 68 67 69 71  Temp: 99.9 F (37.7 C) 99.5 F (37.5 C) 99.6 F (37.6 C) 99.5 F (37.5 C)  TempSrc: Oral Oral Oral Oral  Resp: 20 18 18 18   Height:      Weight: 121.745 kg (268 lb 6.4 oz)   122.925 kg (271 lb)  SpO2: 98% 94% 98% 99%    Intake/Output from previous day:  Intake/Output Summary (Last 24 hours) at 07/05/12 0719 Last data filed at 07/04/12 0900  Gross per 24 hour  Intake    240 ml  Output      0 ml  Net    240 ml    Physical Exam: Physical exam: Well-developed obese in no acute distress.  Skin is warm and dry.  HEENT is normal.  Neck is supple.  Chest is clear to auscultation with normal expansion.  Cardiovascular exam is regular rate and rhythm.  Abdominal exam nontender or distended. No masses palpated. Extremities show no edema.  neuro grossly intact    Lab Results: Basic Metabolic Panel:  [facility] 07/03/12 0703  NA 137  K 4.0  CL 104  CO2 26  GLUCOSE 253*  BUN 17  CREATININE 1.13*  CALCIUM 8.6  MG --  PHOS --   CBC:  Basename 07/05/12 0520 07/04/12 0635  WBC 10.4 12.4*  NEUTROABS 6.2 7.5  HGB 12.0 11.5*  HCT 36.4 34.9*  MCV 78.8 78.4  PLT 281 241     Assessment/Plan:  1) S/P MI - Cath reveals 3 vessel CAD and severe LV dysfunction. Drug screen positive for cocaine. Plan continue ASA, heparin, NTG, statin and ACEI. Continue coreg 6.25 mg po BID. For CABG this week, timing per Dr Prescott Gum. 2) Ischemic CM - continue ACEI and coreg as outlined. 3) CAD - As above 4) DM - Management per IM 5) Substance abuse - needs rehab once medical issues addressed. 6) Noncompliance  7) HTN - titrate meds as needed 8) Hyperlipidemia - continue statin 9) Microcytosis - guaiac stool; continue protonix; GI eval after she recovers from cardiac issues.  [name] [name] 07/05/2012, 7:19  AM

## 2012-07-05 NOTE — Progress Notes (Signed)
Have come twice today to walk with pt and she has declined. Sts she feels down today, just not up to it. Has been in bed except trips to BR. Pt spoke of her worries r/t upcoming surg and her personal life. Offered emotional support. Also pt eager to discuss diet. Gave pt diet sheets. Will continue to follow. Not willing to walk today (many attempts). [place], ACSM

## 2012-07-05 NOTE — Progress Notes (Signed)
INTERNAL MEDICINE TEACHING SERVICE Attending Note  Date: 07/05/2012  Patient name: CHAKA LEONARD  Medical record number: [id number]  Date of birth: [birth year]    This patient has been seen and discussed with the house staff. Please see their note for complete details. I concur with their findings with the following additions/corrections: States she feels well. No CP, no SOB. Admits to not always following a diabetic diet. No PND, no orthopnea. No bleeding. No dysuria. VSS and reviewed. Gen: AAOx3, NAD. HEENT: EOMI, PERRLA, no icterus. CV: S1S2, no m/r/g, RRR. Pulm: CTA bilat. Abd/GI: Soft, NT, +BS, no guarding. LE: 2/4 pulses, no c/c/e.  A/P: 59 yr. Old AAF w/ hx HL, HTN, IDDM type 2 (? No consistent history with regards to insulin use), hx CVA, crack cocaine use, presented with chest pain, s/p NSTEMI.  1. NSTEMI: Continue heparin gtt as recommended by Card, ASA, statin, ACE-i. NTG SL as needed for CP. Stable and CP free. She is pending CABG. Follow H/H, PTT while on Heparin gtt. 2. IDDM type 2: Obtaining better control. Change to Lantus QHS dosing. 3. Leukocytosis: resolved. She is noted to have trichomonas in urine w/ large leukocytes. She is afebrile. Treatment with Flagyl 2 g PO x1 given. No leukocytosis at this time is noted. 4. HTN: Continue current tx w/ lisinopril and coreg. 5. Pre-renal/recent AKI: This is resolved, monitor in setting of ACE-1. 6. Rest per resident physician note.   Dominic Pea, DO  07/05/2012, 12:28 PM

## 2012-07-05 NOTE — Progress Notes (Signed)
Resident Addendum to Medical Student Note   I have seen and examined the patient, and reviewed the daily progress note by Andrey Farmer, MSIV and discussed the care of the patient with them. See below for documentation of my findings, assessment, and plans.   Subjective:    Currently, the patient has no acute complaints. Has no chest pain, difficulty breathing, shortness of breath..    Interval Events: None.    Objective:    VS: Reviewed as documented in electronic medical record  Meds: Reviewed as documented in electronic medical record  Labs: Reviewed as documented in electronic medical record  Imaging: Reviewed as documented in electronic medical record   Physical Exam: General: Vital signs reviewed and noted. Well-developed, well-nourished, in no acute distress; alert, appropriate and cooperative throughout examination.  Lungs:  Normal respiratory effort. Clear to auscultation BL without crackles or wheezes.  Heart: RRR. S1 and S2 normal without gallop, murmur, or rubs.  Abdomen:  BS normoactive. Soft, Nondistended, non-tender.  No masses or organomegaly.  Extremities: No pretibial edema.    Assessment/ Plan:   Pt is a 61 y.o. yo female with a PMHx of uncontrolled DMII (A1c 10.5), HTN, cocaine abuse who was admitted on 06/29/2012 with symptoms of chest pain after cocaine use and also found to have profound hyperglycemia with admission CBG > 700 mg/dL. Admission troponin was > 17, diagnosed with NSTEMI, she was started on nitroglycerin and heparin gtt, and loaded with brilinta. Cardiac cath on 7/2 revealed severe 3 vessel disease (80% focal stenosis in proximal LAD, left circumflex with mid circumflex with a focal 80% ulcerated stenosis, and RCA with 40-50% diffuse disease in proximal vessel). These lesions were not amenable to PCI alone, therefore, she is planned for probable CABG in the upcoming week.  1. NSTEMI - in setting of cocaine abuse, and severe underlying 3 vessel  disease evidence on cardiac catheterization on 07/02. Currently, patient remains on heparin and nitroglycerin drip, which are adequately controlling her chest pain, in anticipation of possible CABG in the up coming week.  - Appreciate cardiology and CVTS assistance and input in the management of our patient.   - Continue heparin drip - will need to be continued until surgery (as recommended by cardiology, Dr. Stanford Breed) - Continue aspirin, heparin, statin, and ACE-I. - Continue nitro paste and SL NTG PRN - Continue Coreg. - Await CABG this week.  2. Hyperglycemia in setting of baseline poorly controlled DMII (A1c of 10.5 on admission) - Improved after changing to qHS coverage, will still need to slowly escalate.Lantus should act for 24 hours, may not always do so in all patients.   - Continue current Lantus to 47 units qHS. - Continue increased meal coverage at 11 units, she is requiring up to 16 units with meals. - Counseled to AVOID eating outside food that her family is bring in.  - Diabetic diet.  3. Trichomonas infection - the patient has been informed of the infection, and need for her partner to be treated as well. She received 2 g of Flagyl on 07/06. Likely at least partly accounts for her leukocytosis. - No further treatment needed at this time. - Patient informed to have partner treated.  4. Leukocytosis - WBC count 13 (07/06) --> resolved today with WBC count of 10. Etiology still not completely clear, maybe at least partially contributed by her Trichomonas infection, however, she may also have a UTI in setting of her increased urgency of her urine, in setting of uncontrolled  DMII. She will need to be monitored closely for any indication of infection. - Will continue to trend and monitor for signs/ symptoms of infection.  5. Normocytic anemia - Hemoglobin 12 today, stable. Possible AOCD secondary to DMII (Anemia panel showing iron 54, TIBC 230, saturation ratio 23 and ferritin 266,  which is not suggestive of iron deficiency anemia. As well, FOBT negative.) However, given newly started on heparin gtt, will need to continue to monitor closely.   - Closely monitor CBC, signs of bleeding.  6. Constipation  - Continue Miralax and Senna-S - hold for loose stools.   7. Hypertension - On nitro paste, coreg, lisinopril.  - Continue nitro paste. - continue Lisinopril and Coreg.  8. Acute Kidney Injury - Resolved. On admission, prerenal azotemia present, which resolved. On 07/04 Mild bump in Cr to 1.1, in setting of initiating an ACE-I, which can be expected, and is ok at this point. This slight increase is stable.  - continue to monitor BMET.   9. Cocaine abuse - Provided education regarding cessation.  10 . Hyperlipidemia - LDL 154, goal LDL < 70 mg/dL given concurrent DMII and severe CAD.  Noncompliance with home pravastatin prior to admission. - Changed to Lipitor during this hospitalization. - Will plan to recheck FLP in approximately 6 weeks as an outpatient to assess if goal LDL achieved with this therapy, and will alternatively adjust as indicated.  11. DVT Prophylaxis -  Full dose heparin   12. Dispo - Deferred for now, until surgery can be planned for next week for CABG.    Length of Stay: 6 days   Signed: Chilton Greathouse, Jacelyn Pi, Internal Medicine Resident Pager: [phone] (7AM-5PM) 07/05/2012, 7:35 AM

## 2012-07-05 NOTE — Progress Notes (Signed)
Multi vessel CAD w/ Non - stemi CABG planned 7-12 Will  treat UTI and better control DM ( A1C > 10) while  Waiting OR date

## 2012-07-05 NOTE — Progress Notes (Signed)
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  No Known Allergies  Patient Measurements: Height: 5\' 8"  (172.7 cm) Weight: 271 lb (122.925 kg) IBW/kg (Calculated) : 63.9  Heparin Dosing Weight: 91.2 kg  Vital Signs: Temp: 99.5 F (37.5 C) (07/08 0424) Temp src: Oral (07/08 0424) BP: 130/76 mmHg (07/08 0424) Pulse Rate: 71  (07/08 0424)  Labs:  Basename 07/05/12 0520 07/04/12 0635 07/03/12 0703  HGB 12.0 11.5* --  HCT 36.4 34.9* 35.2*  PLT 281 241 216  APTT -- -- --  LABPROT -- -- --  INR -- -- --  HEPARINUNFRC 0.40 0.42 0.50  CREATININE -- -- 1.13*  CKTOTAL -- -- --  CKMB -- -- --  TROPONINI -- -- --    Estimated Creatinine Clearance: 72.2 ml/min (by C-G formula based on Cr of 1.13).   Medications:  Infusions:     . sodium chloride 10 mL/hr (06/29/12 2212)  . heparin 1,450 Units/hr (07/04/12 [id number])  . DISCONTD: nitroGLYCERIN Stopped (07/02/12 0815)    Assessment: 61 y.o. M on heparin for 3VCAD while awaiting CABG this week with a therapeutic heparin level this morning. (HL 0.4). Hgb/Hct/Plt stable. No s/sx of bleeding noted.   Goal of Therapy:  Heparin level 0.3-0.7 units/ml Monitor platelets by anticoagulation protocol: Yes   Plan:  1. Continue heparin infusion at 1450 units/hr (14.5 ml/hr) 2. Will continue to monitor for any signs/symptoms of bleeding and will follow up with heparin level in the a.m.   Maryanna Shape, PharmD, BCPS  Clinical Pharmacist  Pager: [phone]   07/05/2012 8:38 AM

## 2012-07-05 NOTE — Progress Notes (Signed)
Patient ID: [name] [name], female   DOB: [birth year], 61 y.o.   MRN: [id number] Medical Student Daily Progress Note  Subjective: Patient had no complaints, she slept well last night. No CP, nausea, vomiting, sob, or any other symptoms.  Interval events: No acute events overnight Objective: Vital signs in last 24 hours: Filed Vitals:   07/04/12 0416 07/04/12 1410 07/04/12 2029 07/05/12 0424  BP: 112/68 95/53 120/74 130/76  Pulse: 68 67 69 71  Temp: 99.9 F (37.7 C) 99.5 F (37.5 C) 99.6 F (37.6 C) 99.5 F (37.5 C)  TempSrc: Oral Oral Oral Oral  Resp: 20 18 18 18   Height:      Weight: 121.745 kg (268 lb 6.4 oz)   122.925 kg (271 lb)  SpO2: 98% 94% 98% 99%   Weight change: 1.179 kg (2 lb 9.6 oz)  Intake/Output Summary (Last 24 hours) at 07/05/12 0741 Last data filed at 07/04/12 0900  Gross per 24 hour  Intake    240 ml  Output      0 ml  Net    240 ml   Physical Exam: Lungs: clear to auscultation bilaterally Heart: regular rate and rhythm, S1, S2 normal, no murmur, click, rub or gallop Abdomen: soft, non-tender; bowel sounds normal; no masses,  no organomegaly Extremities: extremities normal, atraumatic, no cyanosis or edema Lab Results: @labtest2 @ Micro Results: Recent Results (from the past 240 hour(s))  MRSA PCR SCREENING     Status: Normal   Collection Time   06/29/12 12:26 PM      Component Value Range Status Comment   MRSA by PCR NEGATIVE  NEGATIVE Final    Studies/Results: No results found. Medications: I have reviewed the patient's current medications. Scheduled Meds:   . aspirin EC  325 mg Oral Daily  . atorvastatin  80 mg Oral q1800  . brinzolamide  1 drop Both Eyes TID  . carvedilol  6.25 mg Oral BID WC  . insulin aspart  0-20 Units Subcutaneous TID WC  . insulin aspart  0-5 Units Subcutaneous QHS  . insulin aspart  11 Units Subcutaneous TID WC  . insulin glargine  47 Units Subcutaneous QHS  . latanoprost  1 drop Both Eyes QHS  . lisinopril  2.5 mg  Oral Daily  . nitroGLYCERIN  2 inch Topical Q6H  . pantoprazole  40 mg Oral Q0600  . polyethylene glycol  17 g Oral Daily  . senna-docusate  1 tablet Oral BID  . DISCONTD: insulin aspart  9 Units Subcutaneous TID WC  . DISCONTD: insulin glargine  45 Units Subcutaneous Daily   Continuous Infusions:   . sodium chloride 10 mL/hr (06/29/12 2212)  . heparin 1,450 Units/hr (07/04/12 [id number])  . DISCONTD: nitroGLYCERIN Stopped (07/02/12 0815)   PRN Meds:.acetaminophen, dextrose, LORazepam, morphine injection, nitroGLYCERIN, ondansetron (ZOFRAN) IV Assessment/Plan:  This is a 61 y/o woman with history of CVA, DM, HTN, HLD, and cocaine admitted on 07/02 with CP (in setting of cocaine) and BG greater than 700.Trop was 17 on admission and EKG showed T wave inversion. She was diagnosed with a NSTEMI and was emergently cathed revealing severe three vessel disease and an EF of 25%. She was started on a nitro and heparin drip. It was determined that she was not amenable to PCI and thus CABG has been planned for 07/12. She is a type 2 diabetic with uncontrolled diabetes and so she was placed on a regimented insulin plan.   1. NSTEMI (non-ST elevated myocardial infarction)- CT surgery has  spoken to patient and family and will likely have CABG later this week. Patient was initially on Nitro drip but it was weaned and with Cards recommendation,Nitro paste 2 inches q6 was started with it being held from 12 am to 6 am. Patient also has Nitro PRN if needed. Heparin drip is continued and is being managed by pharmacy and Cards. Initial EF of 25% was measured during cath procedure 4 days ago but the most recent echo reveals an improved EF of 55%. The patient is on aspirin.  -Heparin drip  -Aspirin  -Nitro paste, Coreg and Lisinopril  -Await CABG   2. Hyperglycemia- Most recent BG is 181. Yesterday her initial insulin regiment was Lantus 45, prandial aspart 9 units, and SSI resistant coverage. She continued to have  hyperglycemia episodes throughout the day [id number]) and requiring significant SSI coverage. Her Lantus was changed to 47 since she was consistently hyperglycemic and her prandial was changed to 11 to better cover her during meals. Her SSI was left the same. Also, she was receiving her Lantus dose in the AM so this morning's dose was held and will be switched to evening dosing. Likely that since she did not receive her Lantus dose yesterday morning, but received it last night, her daily BG's were high. Will not make any adjustments to insulin regiment at this time. Will continue to monitor BG's and make adjustments as necessary.  -Lantus 47 units at night  -Continue to monitor BG's   3. ACE-I induced creatinine bump- Resolved. Will not continue routine BMP's but will continue to assess for any clinical symptoms such as decreased urine output and edema.  -Will monitor for clinical symptoms   4. Leukocytosis- Resolved. White count trend has been 11.3 --> 9.5 --> 13.1 --> 12.6-->10.4 . No fevers or complaints of chills. Urinary catheter has been removed and there are no signs of any phlebitis around cardiac cath site. Unclear as to the cause of the leukocytosis, it's possible that it's from the UTI but will continue to monitor through routine CBC's.  -Etiology unclear, possibly 2/2 UTI  -Will continue to monitor -Resolved   5. UTI- UA yesterday revealed leukocytes and Trichomonas. One dose of Flagyl 2 g was given for treatment. Patient was counseled on trichomonas and it's mode of transmission. Patient states that she's been in a monogamous relationship for 17 years. Patient was also counseled to tell partner that he would need treatment for trichomonas as well.  -Treated with one dose Flagyl 2 grams  -Counseled patient that partner will need to get treated   6. Hyperlipidemia- On lipitor 80 mg. Target LDL level will be <70 due to CHD, diabetes, and hyperlipidemia. Will continue to encourage medication  compliance.  -On lipitor 80 mg  -Monitor as outpatient.   7. Hypertension- On Nitro paste 2 in. q6 with oral medications Coreg and lisinopril. Patient had asymptomatic episode of hypotension. Will continue to monitor the situation.   8. Cocaine abuse- Cocaine abuse causes sympathetic activation via inhibition of norepinephrine uptake which leads to over excitation of the heart and increased oxygen consumption of the heart muscle. This is one possible cause of MI's in individuals who use cocaine. Cocaine is a potent vasoconstrictor and can cause vasospasm of the arteries which can lead to ACS. Cocaine can also promote thrombus formation. Will stress these and other dangers of cocaine abuse to the patient and will discuss treatment options at later date.   9. RBC microcytosis- Hgb 11.7 and Iron studies are  normal. Will continue to clinically assess for any signs of anemia.   10. Prophy- IV Heparin drip   11. Dispo- Awaiting CT surgery recs in regards to CABG date.    LOS: 6 days   This is a Careers information officer Note.  The care of the patient was discussed with Dr. Guy Sandifer and the assessment and plan formulated with their assistance.  Please see their attached note for official documentation of the daily encounter.  Andrey Farmer T 07/05/2012, 7:41 AM

## 2012-07-06 LAB — CBC WITH DIFFERENTIAL/PLATELET
Basophils Absolute: 0 10*3/uL (ref 0.0–0.1)
Eosinophils Absolute: 0.2 10*3/uL (ref 0.0–0.7)
Eosinophils Relative: 2 % (ref 0–5)
Lymphocytes Relative: 29 % (ref 12–46)
Lymphs Abs: 2.8 10*3/uL (ref 0.7–4.0)
MCH: 26.2 pg (ref 26.0–34.0)
MCV: 79.2 fL (ref 78.0–100.0)
Neutrophils Relative %: 59 % (ref 43–77)
Platelets: 245 10*3/uL (ref 150–400)
RBC: 4.24 MIL/uL (ref 3.87–5.11)
RDW: 17.4 % — ABNORMAL HIGH (ref 11.5–15.5)
WBC: 9.7 10*3/uL (ref 4.0–10.5)

## 2012-07-06 LAB — BASIC METABOLIC PANEL
CO2: 25 mEq/L (ref 19–32)
Calcium: 8.8 mg/dL (ref 8.4–10.5)
Creatinine, Ser: 0.97 mg/dL (ref 0.50–1.10)
GFR calc non Af Amer: 62 mL/min — ABNORMAL LOW (ref >90)
Sodium: 142 mEq/L (ref 135–145)

## 2012-07-06 LAB — GLUCOSE, CAPILLARY

## 2012-07-06 LAB — MAGNESIUM: Magnesium: 1.6 mg/dL (ref 1.5–2.5)

## 2012-07-06 LAB — PHOSPHORUS: Phosphorus: 3.2 mg/dL (ref 2.3–4.6)

## 2012-07-06 LAB — HEPARIN LEVEL (UNFRACTIONATED): Heparin Unfractionated: 0.51 IU/mL (ref 0.30–0.70)

## 2012-07-06 NOTE — Progress Notes (Signed)
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  No Known Allergies  Patient Measurements: Height: 5\' 8"  (172.7 cm) Weight: 267 lb 3.2 oz (121.2 kg) IBW/kg (Calculated) : 63.9  Heparin Dosing Weight: 91.2 kg  Vital Signs: Temp: 100 F (37.8 C) (07/09 0426) Temp src: Oral (07/09 0426) BP: 114/69 mmHg (07/09 0747) Pulse Rate: 70  (07/09 0747)  Labs:  Basename 07/06/12 0555 07/05/12 0520 07/04/12 0635  HGB 11.1* 12.0 --  HCT 33.6* 36.4 34.9*  PLT 245 281 241  APTT -- -- --  LABPROT -- -- --  INR -- -- --  HEPARINUNFRC 0.51 0.40 0.42  CREATININE 0.97 -- --  CKTOTAL -- -- --  CKMB -- -- --  TROPONINI -- -- --    Estimated Creatinine Clearance: 83.5 ml/min (by C-G formula based on Cr of 0.97).   Medications:  Infusions:     . sodium chloride 10 mL/hr (06/29/12 2212)  . heparin 1,450 Units/hr (07/04/12 [id number])    Assessment: 61 y.o. 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 Heparin level remains therapeutic and stable this morning (HL 0.51). Hgb/Hct/Plt stable. No s/sx of bleeding noted.   Goal of Therapy:  Heparin level 0.3-0.7 units/ml Monitor platelets by anticoagulation protocol: Yes   Plan:  1. Continue heparin infusion at 1450 units/hr (14.5 ml/hr) 2. Will continue to monitor for any signs/symptoms of bleeding and will follow up with heparin level in the a.m.   Maryanna Shape, PharmD, BCPS  Clinical Pharmacist  Pager: [phone]   07/06/2012 8:38 AM

## 2012-07-06 NOTE — Progress Notes (Signed)
Patient ID: [name] [name], female   DOB: 14-May-1951, 61 y.o.   MRN: [id number] Medical Student Daily Progress Note  Subjective: Patient reports being down 2/2 disagreement with partner but denies any sob, nausea, dysuria, chest pain, or any other symptoms.  Interval Events: No acute events overnight.  Objective: Vital signs in last 24 hours: Filed Vitals:   07/05/12 1701 07/05/12 1950 07/06/12 0426 07/06/12 0747  BP: 106/68 105/62 97/64 114/69  Pulse: 72 70 67 70  Temp:  100.5 F (38.1 C) 100 F (37.8 C)   TempSrc:  Oral Oral   Resp:   19   Height:      Weight:   121.2 kg (267 lb 3.2 oz)   SpO2:  97% 92%    Weight change: -1.725 kg (-3 lb 12.8 oz)  Intake/Output Summary (Last 24 hours) at 07/06/12 [id number] Last data filed at 07/06/12 0700  Gross per 24 hour  Intake   1006 ml  Output   1302 ml  Net   -296 ml   Physical Exam:    Lungs: clear to auscultation bilaterally     Heart: regular rate and rhythm, S1, S2 normal, no murmur, click, rub or gallop     Abdomen: soft, non-tender; bowel sounds normal; no masses, no organomegaly     Extremities: extremities normal, atraumatic, no cyanosis or edema   Lab Results: @labtest2 @ Micro Results: Recent Results (from the past 240 hour(s))  MRSA PCR SCREENING     Status: Normal   Collection Time   06/29/12 12:26 PM      Component Value Range Status Comment   MRSA by PCR NEGATIVE  NEGATIVE Final   URINE CULTURE     Status: Normal   Collection Time   07/03/12  3:00 PM      Component Value Range Status Comment   Specimen Description URINE, CLEAN CATCH   Final    Special Requests NONE   Final    Culture  Setup Time 07/04/2012 11:19   Final    Colony Count 35,000 COLONIES/ML   Final    Culture     Final    Value: Multiple bacterial morphotypes present, none predominant. Suggest appropriate recollection if clinically indicated.   Report Status 07/05/2012 FINAL   Final    Studies/Results: No results found. Medications: I have reviewed  the patient's current medications. Scheduled Meds:   . aspirin EC  325 mg Oral Daily  . atorvastatin  80 mg Oral q1800  . brinzolamide  1 drop Both Eyes TID  . carvedilol  6.25 mg Oral BID WC  . insulin aspart  0-20 Units Subcutaneous TID WC  . insulin aspart  0-5 Units Subcutaneous QHS  . insulin aspart  11 Units Subcutaneous TID WC  . insulin glargine  47 Units Subcutaneous QHS  . latanoprost  1 drop Both Eyes QHS  . lisinopril  2.5 mg Oral Daily  . nitroGLYCERIN  2 inch Topical Q6H  . pantoprazole  40 mg Oral Q0600  . polyethylene glycol  17 g Oral Daily  . senna-docusate  1 tablet Oral BID   Continuous Infusions:   . sodium chloride 10 mL/hr (06/29/12 2212)  . heparin 1,450 Units/hr (07/04/12 0938)   PRN Meds:.acetaminophen, dextrose, LORazepam, morphine injection, nitroGLYCERIN, ondansetron (ZOFRAN) IV Assessment/Plan:  This is a 61 y/o woman with history of CVA, DM, HTN, HLD, and cocaine admitted on 07/02 with CP (in setting of cocaine) and BG greater than 700.Trop was 17 on admission and  EKG showed T wave inversion. She was diagnosed with a NSTEMI and was emergently cathed revealing severe three vessel disease and an EF of 25%. She was started on a nitro and heparin drip. It was determined that she was not amenable to PCI and thus CABG has been planned for 07/12. She is a type 2 diabetic with uncontrolled diabetes and so she was placed on a regimented insulin plan.   1. NSTEMI (non-ST elevated myocardial infarction)- CT surgery has spoken to patient and family and will likely have CABG later this week. Patient was initially on Nitro drip but it was weaned and with Cards recommendation,Nitro paste 2 inches q6 was started with it being held from 12 am to 6 am. Patient also has Nitro PRN if needed. Heparin drip is continued and is being managed by pharmacy and Cards. Initial EF of 25% was measured during cath procedure 4 days ago but the most recent echo reveals an improved EF of 55%.  The patient is on aspirin.  -Heparin drip  -Aspirin  -Nitro paste, Coreg and Lisinopril  -CABG on 07/12   2. Hyperglycemia- Most recent fasting BG is 118. Yesterday she continued to have hyperglycemia episodes throughout the day [id number]) and requiring significant SSI coverage. Her Lantus was changed to 47 since she was consistently hyperglycemic and her prandial was changed to 11 to better cover her during meals. Her SSI was left the same. Likely that since she did not receive her Lantus dose yesterday morning, but received it last night, her daily BG's were high. Will not make any adjustments to insulin regiment at this time. Will continue to monitor BG's and make adjustments as necessary.  -Lantus 47 units at night  -Continue to monitor BG's   3. ACE-I induced creatinine bump- Resolved. Will not continue routine BMP's but will continue to assess for any clinical symptoms such as decreased urine output and edema.  -Will monitor for clinical symptoms   4. Leukocytosis- Resolved. . No fevers or complaints of chills. Urinary catheter has been removed and there are no signs of any phlebitis around cardiac cath site. Unclear as to the cause of the leukocytosis, it's possible that it's from the UTI but will continue to monitor through routine CBC's.  -Etiology unclear, possibly 2/2 UTI  -Will continue to monitor  -Resolved   5. UTI- UA yesterday revealed leukocytes and Trichomonas. One dose of Flagyl 2 g was given for treatment. Patient was counseled on trichomonas and it's mode of transmission. Patient states that she's been in a monogamous relationship for 17 years. Patient was also counseled to tell partner that he would need treatment for trichomonas as well.  -Treated with one dose Flagyl 2 grams  -Counseled patient that partner will need to get treated   6. Hyperlipidemia- On lipitor 80 mg. Target LDL level will be <70 due to CHD, diabetes, and hyperlipidemia. Will continue to encourage  medication compliance.  -On lipitor 80 mg  -Monitor as outpatient.   7. Hypertension- On Nitro paste 2 in. q6 with oral medications Coreg and lisinopril.  8. Cocaine abuse- Cocaine abuse causes sympathetic activation via inhibition of norepinephrine uptake which leads to over excitation of the heart and increased oxygen consumption of the heart muscle. This is one possible cause of MI's in individuals who use cocaine. Cocaine is a potent vasoconstrictor and can cause vasospasm of the arteries which can lead to ACS. Cocaine can also promote thrombus formation. Will stress these and other dangers of cocaine abuse  to the patient and will discuss treatment options at later date.   9. RBC microcytosis- Hgb 11.7 and Iron studies are normal. Will continue to clinically assess for any signs of anemia.   10. Prophy- IV Heparin drip   11. Dispo- Awaiting CABG on 07/12.    LOS: 7 days   This is a Careers information officer Note.  The care of the patient was discussed with Dr. Obie Dredge and the assessment and plan formulated with their assistance.  Please see their attached note for official documentation of the daily encounter.  Andrey Farmer T 07/06/2012, 9:17 AM

## 2012-07-06 NOTE — Progress Notes (Signed)
INTERNAL MEDICINE TEACHING SERVICE Attending Note  Date: 07/06/2012  Patient name: [name] [name]  Medical record number: [id number]  Date of birth: 04/12/1951    This patient has been seen and discussed with the house staff. Please see their note for complete details. I concur with their findings with the following additions/corrections: Feels well. No CP, no SOB. No N/V/D/C. No bleeding. Offers no complaints. VSS and reviewed. Gen: AAOx3, NAD. HEENT: EOMI, PERRLA, no icterus, no adenopathy. CV: S1, S2, no m/r/g, RRR. PULM: CTA bilat. ABD/GI: Soft, NT, +BS, no guarding. Ext: 2/4 pulses, no c/c/e.  A/P: 65 yr. Old AAF w/ hx HL, HTN, IDDM type 2 (? No consistent history with regards to insulin use), hx CVA, crack cocaine use, presented with chest pain, s/p NSTEMI.  1. NSTEMI: Continue heparin gtt as recommended by Card, ASA, statin, ACE-i. NTG SL as needed for CP. Stable and CP free. She is pending CABG. Follow H/H, PTT while on Heparin gtt. 2. IDDM type 2: Obtaining better control. Changed to Lantus QHS dosing. 3. Leukocytosis: resolved. She is noted to have trichomonas in urine w/ large leukocytes. She is afebrile. Treatment with Flagyl 2 g PO x1 given. No leukocytosis at this time is noted. 4. HTN: Continue current tx w/ lisinopril and coreg. 5. Pre-renal/recent AKI: This is resolved, monitor in setting of ACE-1. 6. Rest per resident physician note.   Dominic Pea, DO  07/06/2012, 1:53 PM

## 2012-07-06 NOTE — Progress Notes (Signed)
Cardiac Surgery book given to patient

## 2012-07-07 DIAGNOSIS — I2510 Atherosclerotic heart disease of native coronary artery without angina pectoris: Secondary | ICD-10-CM

## 2012-07-07 LAB — BASIC METABOLIC PANEL
CO2: 28 mEq/L (ref 19–32)
Chloride: 107 mEq/L (ref 96–112)
GFR calc non Af Amer: 55 mL/min — ABNORMAL LOW (ref >90)
Glucose, Bld: 147 mg/dL — ABNORMAL HIGH (ref 70–99)
Potassium: 4 mEq/L (ref 3.5–5.1)
Sodium: 143 mEq/L (ref 135–145)

## 2012-07-07 LAB — GLUCOSE, CAPILLARY
Glucose-Capillary: 130 mg/dL — ABNORMAL HIGH (ref 70–99)
Glucose-Capillary: 157 mg/dL — ABNORMAL HIGH (ref 70–99)

## 2012-07-07 LAB — PREPARE RBC (CROSSMATCH)

## 2012-07-07 LAB — CBC WITH DIFFERENTIAL/PLATELET
Hemoglobin: 11.2 g/dL — ABNORMAL LOW (ref 12.0–15.0)
Lymphocytes Relative: 26 % (ref 12–46)
Lymphs Abs: 2.5 10*3/uL (ref 0.7–4.0)
Monocytes Relative: 10 % (ref 3–12)
Neutro Abs: 5.7 10*3/uL (ref 1.7–7.7)
Neutrophils Relative %: 61 % (ref 43–77)
Platelets: 265 10*3/uL (ref 150–400)
RBC: 4.21 MIL/uL (ref 3.87–5.11)
WBC: 9.3 10*3/uL (ref 4.0–10.5)

## 2012-07-07 MED ORDER — TEMAZEPAM 15 MG PO CAPS: 15.0000 mg | ORAL_CAPSULE | Freq: Once | ORAL | Status: AC | PRN

## 2012-07-07 MED ORDER — DEXMEDETOMIDINE HCL IN NACL 400 MCG/100ML IV SOLN
0.1000 ug/kg/h | INTRAVENOUS | Status: AC
  Administered 2012-07-09: .2 ug/kg/h via INTRAVENOUS
  Filled 2012-07-07 (×2): qty 100

## 2012-07-07 MED ORDER — CHLORHEXIDINE GLUCONATE 4 % EX LIQD
60.0000 mL | Freq: Once | CUTANEOUS | Status: DC
  Filled 2012-07-07: qty 60

## 2012-07-07 MED ORDER — METOPROLOL TARTRATE 12.5 MG HALF TABLET
12.5000 mg | ORAL_TABLET | Freq: Once | ORAL | Status: AC
  Administered 2012-07-09: 12.5 mg via ORAL
  Filled 2012-07-07: qty 1

## 2012-07-07 MED ORDER — CHLORHEXIDINE GLUCONATE 4 % EX LIQD
60.0000 mL | Freq: Once | CUTANEOUS | Status: AC
  Administered 2012-07-08: 4 via TOPICAL
  Filled 2012-07-07: qty 60

## 2012-07-07 MED ORDER — SODIUM BICARBONATE 8.4 % IV SOLN
INTRAVENOUS | Status: DC
  Filled 2012-07-07 (×2): qty 2.5

## 2012-07-07 MED ORDER — BISACODYL 5 MG PO TBEC: 5.0000 mg | DELAYED_RELEASE_TABLET | Freq: Once | ORAL | Status: DC

## 2012-07-07 MED ORDER — DOPAMINE-DEXTROSE 3.2-5 MG/ML-% IV SOLN
2.0000 ug/kg/min | INTRAVENOUS | Status: DC
  Filled 2012-07-07 (×2): qty 250

## 2012-07-07 MED ORDER — SODIUM CHLORIDE 0.9 % IV SOLN
INTRAVENOUS | Status: AC
  Administered 2012-07-09: 1 [IU]/h via INTRAVENOUS
  Filled 2012-07-07 (×2): qty 1

## 2012-07-07 MED ORDER — MAGNESIUM SULFATE 50 % IJ SOLN
40.0000 meq | INTRAMUSCULAR | Status: DC
  Filled 2012-07-07 (×2): qty 10

## 2012-07-07 MED ORDER — EPINEPHRINE HCL 1 MG/ML IJ SOLN
0.5000 ug/min | INTRAMUSCULAR | Status: DC
  Filled 2012-07-07 (×2): qty 4

## 2012-07-07 MED ORDER — NITROGLYCERIN IN D5W 200-5 MCG/ML-% IV SOLN
2.0000 ug/min | INTRAVENOUS | Status: AC
  Administered 2012-07-09: 5 ug/min via INTRAVENOUS
  Filled 2012-07-07: qty 250

## 2012-07-07 MED ORDER — DEXTROSE 5 % IV SOLN
1.5000 g | INTRAVENOUS | Status: AC
  Administered 2012-07-09: .75 g via INTRAVENOUS
  Administered 2012-07-09: 1.5 g via INTRAVENOUS
  Filled 2012-07-07 (×2): qty 1.5

## 2012-07-07 MED ORDER — TRANEXAMIC ACID 100 MG/ML IV SOLN
1.5000 mg/kg/h | INTRAVENOUS | Status: AC
  Administered 2012-07-09: 1.5 mg/kg/h via INTRAVENOUS
  Filled 2012-07-07 (×2): qty 25

## 2012-07-07 MED ORDER — DEXTROSE 5 % IV SOLN
750.0000 mg | INTRAVENOUS | Status: DC
  Filled 2012-07-07 (×2): qty 750

## 2012-07-07 MED ORDER — POTASSIUM CHLORIDE 2 MEQ/ML IV SOLN
80.0000 meq | INTRAVENOUS | Status: DC
  Filled 2012-07-07 (×2): qty 40

## 2012-07-07 MED ORDER — TRANEXAMIC ACID (OHS) BOLUS VIA INFUSION
15.0000 mg/kg | INTRAVENOUS | Status: AC
  Administered 2012-07-09: 1858.5 mg via INTRAVENOUS
  Filled 2012-07-07 (×2): qty 1859

## 2012-07-07 MED ORDER — TRANEXAMIC ACID (OHS) PUMP PRIME SOLUTION
2.0000 mg/kg | INTRAVENOUS | Status: DC
  Filled 2012-07-07 (×3): qty 2.48

## 2012-07-07 MED ORDER — PHENYLEPHRINE HCL 10 MG/ML IJ SOLN
30.0000 ug/min | INTRAVENOUS | Status: AC
  Administered 2012-07-09: 5 ug/min via INTRAVENOUS
  Filled 2012-07-07 (×2): qty 2

## 2012-07-07 MED ORDER — VANCOMYCIN HCL 1000 MG IV SOLR
1250.0000 mg | INTRAVENOUS | Status: AC
  Administered 2012-07-09: 1250 mg via INTRAVENOUS
  Filled 2012-07-07 (×2): qty 1250

## 2012-07-07 MED ORDER — BISACODYL 5 MG PO TBEC
5.0000 mg | DELAYED_RELEASE_TABLET | Freq: Once | ORAL | Status: AC
  Administered 2012-07-08: 5 mg via ORAL
  Filled 2012-07-07: qty 1

## 2012-07-07 MED ORDER — DIAZEPAM 5 MG PO TABS: 5.0000 mg | ORAL_TABLET | ORAL | Status: DC | PRN

## 2012-07-07 NOTE — Progress Notes (Signed)
CARDIAC REHAB PHASE I   PRE:  Rate/Rhythm: 54SB  BP:  Supine:106/62   Sitting:   Standing:    SaO2: 98%RA  MODE:  Ambulation: 500 ft   POST:  Rate/Rhythem: 78  BP:  Supine:   Sitting: 138/72  Standing:    SaO2: 100%RA 1124-1145 Pt walked 500 ft with handheld asst. Gait steady. Slightly SOB. Stopped twice due to SOB. Denied CP. Stated it felt good to walk.  [name] [name]

## 2012-07-07 NOTE — Progress Notes (Signed)
Resident Co-sign Daily Note: I have seen the patient and reviewed the daily progress note by Lesly Rubenstein, MS IV and discussed the care of the patient with them.  See below for documentation of my findings, assessment, and plans.  Subjective:  Patient noted that she is doing fine. She denies any chest pain, SOB, abdominal pain, diarrhea, increased urinary symptoms. She is ready for her surgery.    Objective: Vital signs in last 24 hours: Filed Vitals:   07/06/12 1730 07/06/12 2100 07/07/12 0444 07/07/12 0627  BP: 106/62 117/71 112/72   Pulse: 62 68 58   Temp:  99.1 F (37.3 C) 100.4 F (38 C) 99.4 F (37.4 C)  TempSrc:  Oral Oral Oral  Resp:  18 18   Height:      Weight:    273 lb 1.6 oz (123.877 kg)  SpO2:  96% 96%     Physical Exam: Constitutional: Vital signs reviewed.  Patient is a well-developed and well-nourished  in no acute distress and cooperative with exam. Alert and oriented x3.  Neck: Supple, Cardiovascular: RRR, S1 normal, S2 normal, no MRG, pulses symmetric and intact bilaterally Pulmonary/Chest: CTAB, no wheezes, rales, or rhonchi Abdominal: Soft. Non-tender, non-distended, bowel sounds are normal Neurological: A&O x3, sensory intact to light touch bilaterally.  Skin: Warm, dry and intact.   Lab Results: Reviewed and documented in Electronic Record Micro Results: Reviewed and documented in Electronic Record Studies/Results: Reviewed and documented in Electronic Record Medications: I have reviewed the patient's current medications. Scheduled Meds:   . aspirin EC  325 mg Oral Daily  . atorvastatin  80 mg Oral q1800  . brinzolamide  1 drop Both Eyes TID  . carvedilol  6.25 mg Oral BID WC  . insulin aspart  0-20 Units Subcutaneous TID WC  . insulin aspart  0-5 Units Subcutaneous QHS  . insulin aspart  11 Units Subcutaneous TID WC  . insulin glargine  47 Units Subcutaneous QHS  . latanoprost  1 drop Both Eyes QHS  . lisinopril  2.5 mg Oral Daily  .  nitroGLYCERIN  2 inch Topical Q6H  . pantoprazole  40 mg Oral Q0600  . polyethylene glycol  17 g Oral Daily  . senna-docusate  1 tablet Oral BID   Continuous Infusions:   . sodium chloride 10 mL/hr (06/29/12 2212)  . heparin 1,450 Units/hr (07/06/12 1428)   PRN Meds:.acetaminophen, dextrose, LORazepam, morphine injection, nitroGLYCERIN, ondansetron (ZOFRAN) IV Assessment/Plan:  This is a 61 y/o woman with history of CVA, DM, HTN, HLD, and cocaine admitted on 07/02 with NSTEMI due to Cocaine.   # NSTEMI in the setting of cocaine abuse: Cardiac cath revealed 3 vessel disease and patient will therefore undergo CABG on 7/12. Patient is currently on Nitro paste, Heparin drip, Aspirin, Coreg and Lisinopril. - Continue current regimen  - Will discuss with Cardiology if Heparin drip needs to be continued  - Appreciate Cardiology assistance  - CABG on 7/12  #DM, type 2 uncontrolled with Hgb A1c of 10.5. CBG somewhat better controlled.  - We will continue current regimen including SSI resistent with meal coverage and Lantus 47 units  #Acute renal failure in the setting of ACEi and volume depletion- resolved. - Will continue to monitor   # Leukocytosis: resolved. She was noted to have Trichomonas . Was treated with Flagyl   # Hyperlipidemia; continue Lipitor 80   #Hypertension; well controlled  - continue current regimen including Lisinopril, Coreg  #Cocaine abuse: SW consult for counseling  in place . We have discussed extensively about complication and treatment options  #DVT: Heparin drip  #Dispo: CABG on 7/12      LOS: 8 days   Bryn Saline 07/07/2012, 7:50 AM

## 2012-07-07 NOTE — Progress Notes (Signed)
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  No Known Allergies  Patient Measurements: Height: 5\' 8"  (172.7 cm) Weight: 273 lb 1.6 oz (123.877 kg) IBW/kg (Calculated) : 63.9  Heparin Dosing Weight: 91.2 kg  Vital Signs: Temp: 99.4 F (37.4 C) (07/10 0627) Temp src: Oral (07/10 0627) BP: 112/72 mmHg (07/10 0444) Pulse Rate: 58  (07/10 0444)  Labs:  Basename 07/07/12 0550 07/06/12 0555 07/05/12 0520  HGB 11.2* 11.1* --  HCT 33.8* 33.6* 36.4  PLT 265 245 281  APTT -- -- --  LABPROT -- -- --  INR -- -- --  HEPARINUNFRC 0.47 0.51 0.40  CREATININE 1.07 0.97 --  CKTOTAL -- -- --  CKMB -- -- --  TROPONINI -- -- --    Estimated Creatinine Clearance: 76.6 ml/min (by C-G formula based on Cr of 1.07).   Medications:  Infusions:     . sodium chloride 10 mL/hr (06/29/12 2212)  . heparin 1,450 Units/hr (07/06/12 1428)    Assessment: 61 y.o. 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 Heparin level remains therapeutic and stable this morning (HL 0.47). Hgb/Hct/Plt stable. No s/sx of bleeding noted.   Goal of Therapy:  Heparin level 0.3-0.7 units/ml Monitor platelets by anticoagulation protocol: Yes   Plan:  1. Continue heparin infusion at 1450 units/hr (14.5 ml/hr) 2. Will continue to monitor for any signs/symptoms of bleeding and will follow up with heparin level in the a.m.   Maryanna Shape, PharmD, BCPS  Clinical Pharmacist  Pager: [phone]   07/07/2012 9:39 AM

## 2012-07-07 NOTE — Progress Notes (Signed)
Patient ID: [name] [name], female   DOB: 24-Dec-1951, 61 y.o.   MRN: [id number] Medical Student Daily Progress Note  Subjective:  Patient states that she feels like a million bucks. No complaints of shortness of breath, chest pain, or any other symptoms. Patient did mention that she got a chill Sunday night but that was in the setting of sitting by an open window in her room while it was raining. No chills currently.  Interval: No acute events overnight. Objective: Vital signs in last 24 hours: Filed Vitals:   07/06/12 1730 07/06/12 2100 07/07/12 0444 07/07/12 0627  BP: 106/62 117/71 112/72   Pulse: 62 68 58   Temp:  99.1 F (37.3 C) 100.4 F (38 C) 99.4 F (37.4 C)  TempSrc:  Oral Oral Oral  Resp:  18 18   Height:      Weight:    123.877 kg (273 lb 1.6 oz)  SpO2:  96% 96%    Weight change: 2.677 kg (5 lb 14.4 oz) No intake or output data in the 24 hours ending 07/07/12 0758 Physical Exam: Lungs: clear to auscultation bilaterally Heart: regular rate and rhythm, S1, S2 normal, no murmur, click, rub or gallop  Abdomen: soft, non-tender; bowel sounds normal; no masses, no organomegaly  Extremities: extremities normal, atraumatic, no cyanosis or edema  Lab Results: Basic Metabolic Panel:  Lab [id number] 0550 07/06/12 0555  NA 143 142  K 4.0 3.9  CL 107 107  CO2 28 25  GLUCOSE 147* 119*  BUN 14 15  CREATININE 1.07 0.97  CALCIUM 9.1 8.8  MG -- 1.6  PHOS -- 3.2   Liver Function Tests: No results found for this basename: AST:2,ALT:2,ALKPHOS:2,BILITOT:2,PROT:2,ALBUMIN:2 in the last 168 hours No results found for this basename: LIPASE:2,AMYLASE:2 in the last 168 hours No results found for this basename: AMMONIA:2 in the last 168 hours CBC:  Lab 07/07/12 0550 07/06/12 0555  WBC 9.3 9.7  NEUTROABS 5.7 5.7  HGB 11.2* 11.1*  HCT 33.8* 33.6*  MCV 80.3 79.2  PLT 265 245   Cardiac Enzymes: No results found for this basename: CKTOTAL:3,CKMB:3,CKMBINDEX:3,TROPONINI:3 in the last  168 hours BNP: No results found for this basename: PROBNP:3 in the last 168 hours D-Dimer: No results found for this basename: DDIMER:2 in the last 168 hours CBG:  Lab 07/07/12 0628 07/06/12 2117 07/06/12 1606 07/06/12 1111 07/06/12 0640 07/05/12 1619  GLUCAP 130* 247* 142* 110* 108* 123*   Hemoglobin A1C: No results found for this basename: HGBA1C in the last 168 hours Fasting Lipid Panel: No results found for this basename: CHOL,HDL,LDLCALC,TRIG,CHOLHDL,LDLDIRECT in the last 168 hours Thyroid Function Tests:  Lab 07/01/12 0455  TSH 1.707  T4TOTAL --  FREET4 --  T3FREE --  THYROIDAB --   Coagulation: No results found for this basename: LABPROT:4,INR:4 in the last 168 hours Anemia Panel:  Lab 06/30/12 1401  VITAMINB12 --  FOLATE --  FERRITIN 266  TIBC 230*  IRON 54  RETICCTPCT --   Urine Drug Screen: Drugs of Abuse     Component Value Date/Time   LABOPIA NONE DETECTED 06/29/2012 0750   COCAINSCRNUR POSITIVE* 06/29/2012 0750   LABBENZ NONE DETECTED 06/29/2012 0750   AMPHETMU NONE DETECTED 06/29/2012 0750   THCU NONE DETECTED 06/29/2012 0750   LABBARB NONE DETECTED 06/29/2012 0750    Alcohol Level: No results found for this basename: ETH:2 in the last 168 hours Urinalysis:  Lab 07/03/12 1103  COLORURINE YELLOW  LABSPEC 1.017  PHURINE 5.0  GLUCOSEU NEGATIVE  HGBUR MODERATE*  BILIRUBINUR NEGATIVE  KETONESUR NEGATIVE  PROTEINUR NEGATIVE  UROBILINOGEN 1.0  NITRITE NEGATIVE  LEUKOCYTESUR LARGE*   Misc. Labs:   Micro Results: Recent Results (from the past 240 hour(s))  MRSA PCR SCREENING     Status: Normal   Collection Time   06/29/12 12:26 PM      Component Value Range Status Comment   MRSA by PCR NEGATIVE  NEGATIVE Final   URINE CULTURE     Status: Normal   Collection Time   07/03/12  3:00 PM      Component Value Range Status Comment   Specimen Description URINE, CLEAN CATCH   Final    Special Requests NONE   Final    Culture  Setup Time 07/04/2012 11:19    Final    Colony Count 35,000 COLONIES/ML   Final    Culture     Final    Value: Multiple bacterial morphotypes present, none predominant. Suggest appropriate recollection if clinically indicated.   Report Status 07/05/2012 FINAL   Final    Studies/Results: No results found. Medications: I have reviewed the patient's current medications. Scheduled Meds:   . aspirin EC  325 mg Oral Daily  . atorvastatin  80 mg Oral q1800  . brinzolamide  1 drop Both Eyes TID  . carvedilol  6.25 mg Oral BID WC  . insulin aspart  0-20 Units Subcutaneous TID WC  . insulin aspart  0-5 Units Subcutaneous QHS  . insulin aspart  11 Units Subcutaneous TID WC  . insulin glargine  47 Units Subcutaneous QHS  . latanoprost  1 drop Both Eyes QHS  . lisinopril  2.5 mg Oral Daily  . nitroGLYCERIN  2 inch Topical Q6H  . pantoprazole  40 mg Oral Q0600  . polyethylene glycol  17 g Oral Daily  . senna-docusate  1 tablet Oral BID   Continuous Infusions:   . sodium chloride 10 mL/hr (06/29/12 2212)  . heparin 1,450 Units/hr (07/06/12 1428)   PRN Meds:.acetaminophen, dextrose, LORazepam, morphine injection, nitroGLYCERIN, ondansetron (ZOFRAN) IV Assessment/Plan:  This is a 61 y/o woman with history of CVA, DM, HTN, HLD, and cocaine admitted on 07/02 with CP (in setting of cocaine) and BG greater than 700.Trop was 17 on admission and EKG showed T wave inversion. She was diagnosed with a NSTEMI and was emergently cathed revealing severe three vessel disease and an EF of 25%. She was started on a nitro and heparin drip. It was determined that she was not amenable to PCI and thus CABG has been planned for 07/12. She is a type 2 diabetic with uncontrolled diabetes and so she was placed on a regimented insulin plan.   1. NSTEMI (non-ST elevated myocardial infarction)- CT surgery has spoken to patient and family and will likely have CABG later this week. Patient was initially on Nitro drip but it was weaned and with Cards  recommendation,Nitro paste 2 inches q6 was started with it being held from 12 am to 6 am. Patient also has Nitro PRN if needed. Heparin drip is continued and is being managed by pharmacy and Cards. Initial EF of 25% was measured during cath procedure 4 days ago but the most recent echo reveals an improved EF of 55%. The patient is on aspirin.  -Heparin drip  -Aspirin  -Nitro paste, Coreg and Lisinopril  -CABG on 07/12    2. Diabetes Mellitus Type 2- Most recent fasting BG is 130. Yesterday her BG's were significantly better controlled, requiring no SSI  coverage. Her Lantus is 47. Will not make any adjustments to insulin regiment at this time. Will continue to monitor BG's and make adjustments as necessary.  -Lantus 47 units at night  -Continue to monitor BG's   3. Acute Renal Failure possibly 2/2 Ace I- Resolved. Will continue BMP's to monitor creatine. -Will monitor  4. Fever of unknown origin- Patient had a low grade fever of 100.4. No complaints of chills or any other symptoms overnight. No Leukocytosis. Temperature is now 99.4. Will continue to monitor and assess for any causes of fever. -Will monitor   5. UTI- UA yesterday revealed leukocytes and Trichomonas. One dose of Flagyl 2 g was given for treatment. Patient was counseled on trichomonas and it's mode of transmission. Patient states that she's been in a monogamous relationship for 17 years. Patient was also counseled to tell partner that he would need treatment for trichomonas as well.  -Treated with one dose Flagyl 2 grams  -Counseled patient that partner will need to get treated   6. Hyperlipidemia- On lipitor 80 mg. Target LDL level will be <70 due to CHD, diabetes, and hyperlipidemia. Will continue to encourage medication compliance.  -On lipitor 80 mg  -Monitor as outpatient.   7. Hypertension- On Nitro paste 2 in. q6 with oral medications Coreg and lisinopril.   8. Cocaine abuse- Cocaine abuse causes sympathetic activation  via inhibition of norepinephrine uptake which leads to over excitation of the heart and increased oxygen consumption of the heart muscle. This is one possible cause of MI's in individuals who use cocaine. Cocaine is a potent vasoconstrictor and can cause vasospasm of the arteries which can lead to ACS. Cocaine can also promote thrombus formation. Will stress these and other dangers of cocaine abuse to the patient and will discuss treatment options at later date.   9. Anemia with RBC microcytosis- Hgb 11.7 and Iron studies are normal. Will continue to clinically assess for any signs of anemia.   10. Prophy- IV Heparin drip   11. Dispo- Awaiting CABG on 07/12.    LOS: 8 days   This is a Careers information officer Note.  The care of the patient was discussed with Dr. Newt Lukes and the assessment and plan formulated with their assistance.  Please see their attached note for official documentation of the daily encounter.  Andrey Farmer T 07/07/2012, 7:58 AM

## 2012-07-07 NOTE — Progress Notes (Signed)
INTERNAL MEDICINE TEACHING SERVICE Attending Note  Date: 07/07/2012  Patient name: [name] [name]  Medical record number: [id number]  Date of birth: 1951-07-19    This patient has been seen and discussed with the house staff. Please see their note for complete details. I concur with their findings with the following additions/corrections: Feels well this AM. Denies CP, SOB, N/V. Denies cough, dysuria. Inquired about details of upcoming CABG, details explained and questions answered.  A/P: 13 yr. Old AAF w/ hx HL, HTN, IDDM type 2 (? No consistent history with regards to insulin use), hx CVA, crack cocaine use, presented with chest pain, s/p NSTEMI.  1. NSTEMI: Continue heparin gtt as recommended by Card, ASA, statin, ACE-i. NTG SL as needed for CP. Stable and CP free. She is pending CABG. Follow H/H, PTT while on Heparin gtt. 2. IDDM type 2: Obtaining better control. Changed to Lantus QHS dosing. Educated on diet. 3. Fever: No clear cause/source. Monitor for now. 4. HTN: Continue current tx w/ lisinopril and coreg. 5. Pre-renal/recent AKI: This is resolved, monitor in setting of ACE-1. 6. Rest per resident physician note.   Dominic Pea, DO  07/07/2012, 2:06 PM

## 2012-07-07 NOTE — Progress Notes (Signed)
Please review separate note.

## 2012-07-07 NOTE — Progress Notes (Signed)
Subjective No Chest pain or dyspnea.   Objective:  Filed Vitals:   07/06/12 1730 07/06/12 2100 07/07/12 0444 07/07/12 0627  BP: 106/62 117/71 112/72   Pulse: 62 68 58   Temp:  99.1 F (37.3 C) 100.4 F (38 C) 99.4 F (37.4 C)  TempSrc:  Oral Oral Oral  Resp:  18 18   Height:      Weight:    123.877 kg (273 lb 1.6 oz)  SpO2:  96% 96%     Intake/Output from previous day: No intake or output data in the 24 hours ending 07/07/12 0706  Physical Exam: Physical exam: Well-developed obese in no acute distress.  Skin is warm and dry.  HEENT is normal.  Neck is supple.  Chest is clear to auscultation with normal expansion.  Cardiovascular exam is regular rate and rhythm.  Abdominal exam nontender or distended. No masses palpated. Extremities show no edema.  neuro grossly intact    Lab Results: Basic Metabolic Panel:  Basename 07/07/12 0550 07/06/12 0555  NA 143 142  K 4.0 3.9  CL 107 107  CO2 28 25  GLUCOSE 147* 119*  BUN 14 15  CREATININE 1.07 0.97  CALCIUM 9.1 8.8  MG -- 1.6  PHOS -- 3.2   CBC:  Basename 07/07/12 0550 07/06/12 0555  WBC 9.3 9.7  NEUTROABS 5.7 5.7  HGB 11.2* 11.1*  HCT 33.8* 33.6*  MCV 80.3 79.2  PLT 265 245     Assessment/Plan:  1) S/P MI - Cath reveals 3 vessel CAD and severe LV dysfunction. Drug screen positive for cocaine. Plan continue ASA, heparin, NTG, statin, ACEI, and coreg. For CABG Friday. 2) Ischemic CM - continue ACEI and coreg as outlined. 3) CAD - As above 4) DM - Management per IM 5) Substance abuse - needs rehab once medical issues addressed. 6) Noncompliance  7) HTN - controlled 8) Hyperlipidemia - continue statin 9) Microcytosis - continue protonix; GI eval after she recovers from cardiac issues. 10) Low grade fever - eval per primary service.  Kirk Ruths 07/07/2012, 7:06 AM

## 2012-07-08 ENCOUNTER — Encounter (HOSPITAL_COMMUNITY): Payer: Self-pay | Admitting: Anesthesiology

## 2012-07-08 LAB — BASIC METABOLIC PANEL
BUN: 13 mg/dL (ref 6–23)
CO2: 26 mEq/L (ref 19–32)
Calcium: 8.6 mg/dL (ref 8.4–10.5)
Chloride: 105 mEq/L (ref 96–112)
Creatinine, Ser: 1.06 mg/dL (ref 0.50–1.10)
GFR calc Af Amer: 64 mL/min — ABNORMAL LOW (ref >90)
GFR calc non Af Amer: 55 mL/min — ABNORMAL LOW (ref >90)
Glucose, Bld: 242 mg/dL — ABNORMAL HIGH (ref 70–99)
Potassium: 3.9 mEq/L (ref 3.5–5.1)
Sodium: 140 mEq/L (ref 135–145)

## 2012-07-08 LAB — CBC
HCT: 33.6 % — ABNORMAL LOW (ref 36.0–46.0)
Hemoglobin: 10.9 g/dL — ABNORMAL LOW (ref 12.0–15.0)
MCH: 26.1 pg (ref 26.0–34.0)
MCHC: 32.4 g/dL (ref 30.0–36.0)
MCV: 80.6 fL (ref 78.0–100.0)
Platelets: 283 10*3/uL (ref 150–400)
RBC: 4.17 MIL/uL (ref 3.87–5.11)
RDW: 17.6 % — ABNORMAL HIGH (ref 11.5–15.5)
WBC: 10.1 10*3/uL (ref 4.0–10.5)

## 2012-07-08 LAB — HEPATIC FUNCTION PANEL
ALT: 27 U/L (ref 0–35)
AST: 25 U/L (ref 0–37)
Albumin: 2.6 g/dL — ABNORMAL LOW (ref 3.5–5.2)
Alkaline Phosphatase: 91 U/L (ref 39–117)
Bilirubin, Direct: <0.1 mg/dL (ref 0.0–0.3)
Total Bilirubin: 0.2 mg/dL — ABNORMAL LOW (ref 0.3–1.2)
Total Protein: 6.2 g/dL (ref 6.0–8.3)

## 2012-07-08 LAB — GLUCOSE, CAPILLARY
Glucose-Capillary: 198 mg/dL — ABNORMAL HIGH (ref 70–99)
Glucose-Capillary: 113 mg/dL — ABNORMAL HIGH (ref 70–99)
Glucose-Capillary: 140 mg/dL — ABNORMAL HIGH (ref 70–99)

## 2012-07-08 LAB — HEPARIN LEVEL (UNFRACTIONATED)
Heparin Unfractionated: 0.18 IU/mL — ABNORMAL LOW (ref 0.30–0.70)
Heparin Unfractionated: 0.46 IU/mL (ref 0.30–0.70)

## 2012-07-08 MED ORDER — INSULIN GLARGINE 100 UNIT/ML ~~LOC~~ SOLN
35.0000 [IU] | Freq: Every day | SUBCUTANEOUS | Status: DC
  Administered 2012-07-08: 35 [IU] via SUBCUTANEOUS

## 2012-07-08 NOTE — Progress Notes (Signed)
Patient ID: [name] [name], female   DOB: [birth year], 61 y.o.   MRN: [id number] Medical Student Daily Progress Note  Subjective: No complaints overnight. Patient states that she stayed up late working on a puzzle. She also admits to having shrimp fried rice, egg rolls, and a soda which was brought in by her partner. No complaints of chest pain, shortness of breath, chills, fevers, or dysuria.  Interval Events: CABG pretreatment protocol started per CT Surgery.  Objective: Vital signs in last 24 hours: Filed Vitals:   07/07/12 1356 07/07/12 1647 07/07/12 2027 07/08/12 0355  BP: 91/51 132/74 92/56 124/75  Pulse: 55 61 64 69  Temp: 98.9 F (37.2 C)  99.3 F (37.4 C) 97.7 F (36.5 C)  TempSrc: Oral  Oral Oral  Resp: 18  20 16   Height:      Weight:    126.191 kg (278 lb 3.2 oz)  SpO2: 95%  94% 99%   Weight change: 2.313 kg (5 lb 1.6 oz)  Intake/Output Summary (Last 24 hours) at 07/08/12 1052 Last data filed at 07/07/12 2108  Gross per 24 hour  Intake    120 ml  Output      0 ml  Net    120 ml   Physical Exam: Lungs: clear to auscultation bilaterally  Heart: regular rate and rhythm, S1, S2 normal, no murmur, click, rub or gallop  Abdomen: soft, non-tender; bowel sounds normal; no masses, no organomegaly  Extremities: extremities normal, atraumatic, no cyanosis or edema  Lab Results: Basic Metabolic Panel:  Lab [id number] 0500 07/07/12 0550 07/06/12 0555  NA 140 143 --  K 3.9 4.0 --  CL 105 107 --  CO2 26 28 --  GLUCOSE 242* 147* --  BUN 13 14 --  CREATININE 1.06 1.07 --  CALCIUM 8.6 9.1 --  MG -- -- 1.6  PHOS -- -- 3.2   Liver Function Tests:  Lab 07/08/12 0500  AST 25  ALT 27  ALKPHOS 91  BILITOT 0.2*  PROT 6.2  ALBUMIN 2.6*   No results found for this basename: LIPASE:2,AMYLASE:2 in the last 168 hours No results found for this basename: AMMONIA:2 in the last 168 hours CBC:  Lab 07/08/12 0500 07/07/12 0550 07/06/12 0555  WBC 10.1 9.3 --  NEUTROABS -- 5.7 5.7   HGB 10.9* 11.2* --  HCT 33.6* 33.8* --  MCV 80.6 80.3 --  PLT 283 265 --   Cardiac Enzymes: No results found for this basename: CKTOTAL:3,CKMB:3,CKMBINDEX:3,TROPONINI:3 in the last 168 hours BNP: No results found for this basename: PROBNP:3 in the last 168 hours D-Dimer: No results found for this basename: DDIMER:2 in the last 168 hours CBG:  Lab 07/08/12 0613 07/07/12 2103 07/07/12 1609 07/07/12 1112 07/07/12 0628 07/06/12 2117  GLUCAP 198* 232* 154* 157* 130* 247*   Hemoglobin A1C: No results found for this basename: HGBA1C in the last 168 hours Fasting Lipid Panel: No results found for this basename: CHOL,HDL,LDLCALC,TRIG,CHOLHDL,LDLDIRECT in the last 168 hours Thyroid Function Tests: No results found for this basename: TSH,T4TOTAL,FREET4,T3FREE,THYROIDAB in the last 168 hours Coagulation: No results found for this basename: LABPROT:4,INR:4 in the last 168 hours Anemia Panel: No results found for this basename: VITAMINB12,FOLATE,FERRITIN,TIBC,IRON,RETICCTPCT in the last 168 hours Urine Drug Screen: Drugs of Abuse     Component Value Date/Time   LABOPIA NONE DETECTED 06/29/2012 0750   COCAINSCRNUR POSITIVE* 06/29/2012 0750   LABBENZ NONE DETECTED 06/29/2012 0750   AMPHETMU NONE DETECTED 06/29/2012 [place] DETECTED 06/29/2012 0750  LABBARB NONE DETECTED 06/29/2012 0750    Alcohol Level: No results found for this basename: ETH:2 in the last 168 hours Urinalysis:  Lab 07/03/12 1103  COLORURINE YELLOW  LABSPEC 1.017  PHURINE 5.0  GLUCOSEU NEGATIVE  HGBUR MODERATE*  BILIRUBINUR NEGATIVE  KETONESUR NEGATIVE  PROTEINUR NEGATIVE  UROBILINOGEN 1.0  NITRITE NEGATIVE  LEUKOCYTESUR LARGE*   Misc. Labs:   Micro Results: Recent Results (from the past 240 hour(s))  MRSA PCR SCREENING     Status: Normal   Collection Time   06/29/12 12:26 PM      Component Value Range Status Comment   MRSA by PCR NEGATIVE  NEGATIVE Final   URINE CULTURE     Status: Normal   Collection  Time   07/03/12  3:00 PM      Component Value Range Status Comment   Specimen Description URINE, CLEAN CATCH   Final    Special Requests NONE   Final    Culture  Setup Time 07/04/2012 11:19   Final    Colony Count 35,000 COLONIES/ML   Final    Culture     Final    Value: Multiple bacterial morphotypes present, none predominant. Suggest appropriate recollection if clinically indicated.   Report Status 07/05/2012 FINAL   Final    Studies/Results: No results found. Medications: I have reviewed the patient's current medications. Scheduled Meds:   . aspirin EC  325 mg Oral Daily  . atorvastatin  80 mg Oral q1800  . bisacodyl  5 mg Oral Once  . brinzolamide  1 drop Both Eyes TID  . carvedilol  6.25 mg Oral BID WC  . cefUROXime (ZINACEF)  IV  1.5 g Intravenous To OR  . cefUROXime (ZINACEF)  IV  750 mg Intravenous To OR  . chlorhexidine  60 mL Topical Once  . dexmedetomidine  0.1-0.7 mcg/kg/hr Intravenous To OR  . DOPamine  2-20 mcg/kg/min Intravenous To OR  . epinephrine  0.5-20 mcg/min Intravenous To OR  . insulin aspart  0-20 Units Subcutaneous TID WC  . insulin aspart  0-5 Units Subcutaneous QHS  . insulin aspart  11 Units Subcutaneous TID WC  . insulin glargine  47 Units Subcutaneous QHS  . insulin (NOVOLIN-R) infusion   Intravenous To OR  . latanoprost  1 drop Both Eyes QHS  . lisinopril  2.5 mg Oral Daily  . magnesium sulfate  40 mEq Other To OR  . metoprolol tartrate  12.5 mg Oral Once  . nitroGLYCERIN  2 inch Topical Q6H  . nitroGLYCERIN  2-200 mcg/min Intravenous To OR  . nitroglycerin-nicardipine-HEPARIN-sodium bicarbonate irrigation for artery spasm   Irrigation To OR  . pantoprazole  40 mg Oral Q0600  . phenylephrine (NEO-SYNEPHRINE) Adult infusion  30-200 mcg/min Intravenous To OR  . polyethylene glycol  17 g Oral Daily  . potassium chloride  80 mEq Other To OR  . senna-docusate  1 tablet Oral BID  . tranexamic acid  15 mg/kg Intravenous To OR  . tranexamic acid  2  mg/kg Intracatheter To OR  . tranexamic acid (CYKLOKAPRON) infusion (OHS)  1.5 mg/kg/hr Intravenous To OR  . vancomycin  1,250 mg Intravenous To OR   Continuous Infusions:   . sodium chloride 10 mL/hr (06/29/12 2212)  . heparin 1,650 Units/hr (07/08/12 [id number])   PRN Meds:.acetaminophen, dextrose, diazepam, LORazepam, morphine injection, nitroGLYCERIN, ondansetron (ZOFRAN) IV, temazepam Assessment/Plan:  This is a 61 y/o woman with history of CVA, DM, HTN, HLD, and cocaine admitted on 07/02 with CP (in  setting of cocaine) and BG greater than 700.Trop was 17 on admission and EKG showed T wave inversion. She was diagnosed with a NSTEMI and was emergently cathed revealing severe three vessel disease and an EF of 25%. She was started on a nitro and heparin drip. It was determined that she was not amenable to PCI and thus CABG has been planned for 07/12. She is a type 2 diabetic with uncontrolled diabetes and so she was placed on a regimented insulin plan.   1. NSTEMI (non-ST elevated myocardial infarction)- CT surgery has spoken to patient and family and will have CABG tomorrow. Patient was initially on Nitro drip but it was weaned and with Cards recommendation,Nitro paste 2 inches q6 was started with it being held from 12 am to 6 am. Patient also has Nitro PRN if needed. Heparin drip is continued and is being managed by pharmacy and Cards. Initial EF of 25% was measured during cath procedure 4 days ago but the most recent echo reveals an improved EF of 55%. The patient is on aspirin.  -Heparin drip  -Aspirin  -Nitro paste, Coreg and Lisinopril  -CABG on 07/12   2. Diabetes Mellitus Type 2- Most recent fasting BG is 198. BG's were elevated throughout the day. Patient admitted to eating shrimp fried rice, egg rolls, and a Dr. Malachi Bonds that was brought in by her partner. Her Lantus is 47. Will cut Lantus by 25% given her NPO status at midnight for her CABG surgery. Will continue to monitor BG's and make  adjustments as necessary post surgery. Evidence is not clear that there is a mortality benefit between intensive glucose control post CABG or allowing BG's in the 160-180 range. Will evaluate regiment at that time.  -Lantus 35 units tonight 2/2 CABG in the AM  -Continue to monitor BG's   3. Acute Renal Failure possibly 2/2 Ace I- Resolved. Will continue BMP's to monitor creatine.  -Will monitor   4. Fever of unknown origin- Patient had a low grade fever of 100.4. No complaints of chills or any other symptoms overnight. No Leukocytosis. Temperature is now 97.7. Will continue to monitor and assess for any causes of fever.  -Will monitor   5. UTI- Resolved. One dose of Flagyl 2 g was given for treatment. Patient was counseled on trichomonas and it's mode of transmission. Patient states that she's been in a monogamous relationship for 17 years. Patient was also counseled to tell partner that he would need treatment for trichomonas as well.  -Treated with one dose Flagyl 2 grams  -Counseled patient that partner will need to get treated   6. Hyperlipidemia- On lipitor 80 mg. Target LDL level will be <70 due to CHD, diabetes, and hyperlipidemia. Will continue to encourage medication compliance.  -On lipitor 80 mg  -Monitor as outpatient.   7. Hypertension- On Nitro paste 2 in. q6 with oral medications Coreg and lisinopril. Some episodes of non-symptomatic hypotension yesterday. Will continue to monitor.   8. Cocaine abuse- Cocaine abuse causes sympathetic activation via inhibition of norepinephrine uptake which leads to over excitation of the heart and increased oxygen consumption of the heart muscle. This is one possible cause of MI's in individuals who use cocaine. Cocaine is a potent vasoconstrictor and can cause vasospasm of the arteries which can lead to ACS. Cocaine can also promote thrombus formation. Will stress these and other dangers of cocaine abuse to the patient and will discuss treatment  options at later date.   9. Anemia with  RBC microcytosis- Hgb 11.7 and Iron studies are normal. Will continue to clinically assess for any signs of anemia.   10. Prophy- IV Heparin drip   11. Dispo- Awaiting CABG on 07/12.     LOS: 9 days   This is a Careers information officer Note.  The care of the patient was discussed with Dr. Newt Lukes and the assessment and plan formulated with their assistance.  Please see their attached note for official documentation of the daily encounter.  Andrey Farmer T 07/08/2012, 10:52 AM

## 2012-07-08 NOTE — Progress Notes (Signed)
Met with patient at bedside who is optimistic and eager to have her CABG tomorrow- she plans to return home post operatively with her boyfriend of 17 years as well as with her PCS worker/aide- she states she has completed her HCPOA papers. Support provided- was unable to discusss recent substance abuse with her due to a visitor who arrived- will f/u later to discuss this with patient. Eduard Clos, MSW, [place]

## 2012-07-08 NOTE — Progress Notes (Signed)
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  No Known Allergies  Patient Measurements: Height: 5\' 8"  (172.7 cm) Weight: 278 lb 3.2 oz (126.191 kg) IBW/kg (Calculated) : 63.9  Heparin Dosing Weight: 91.2 kg  Vital Signs: Temp: 97.7 F (36.5 C) (07/11 0355) Temp src: Oral (07/11 0355) BP: 124/75 mmHg (07/11 0355) Pulse Rate: 69  (07/11 0355)  Labs:  Basename 07/08/12 0500 07/07/12 0550 07/06/12 0555  HGB 10.9* 11.2* --  HCT 33.6* 33.8* 33.6*  PLT 283 265 245  APTT -- -- --  LABPROT -- -- --  INR -- -- --  HEPARINUNFRC 0.18* 0.47 0.51  CREATININE -- 1.07 0.97  CKTOTAL -- -- --  CKMB -- -- --  TROPONINI -- -- --    Estimated Creatinine Clearance: 77.4 ml/min (by C-G formula based on Cr of 1.07).   Medications:  Infusions:     . sodium chloride 10 mL/hr (06/29/12 2212)  . heparin 1,450 Units/hr (07/06/12 1428)    Assessment: 61 y.o. 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 Heparin level subtherapeutic this a.m. No issues with gtt per RN. Hgb/Hct/Plt stable. No s/sx of bleeding noted.   Goal of Therapy:  Heparin level 0.3-0.7 units/ml Monitor platelets by anticoagulation protocol: Yes   Plan:  1. Increase heparin infusion to 1650 units/hr (16.5 ml/hr) 2. Will continue to monitor for any signs/symptoms of bleeding and will follow up with heparin level in 6 hours  Sherlon Handing, PharmD, BCPS Clinical pharmacist, pager [phone] 07/08/2012 6:14 AM

## 2012-07-08 NOTE — Progress Notes (Signed)
INTERNAL MEDICINE TEACHING SERVICE Attending Note  Date: 07/08/2012  Patient name: [name] [name]  Medical record number: [id number]  Date of birth: 1951-08-30    This patient has been seen and discussed with the house staff. Please see their note for complete details. I concur with their findings with the following additions/corrections: Feels well. Admits she ate outside food yesterday and this is why her BG seemed difficult to control. States no CP, no SOB. No N/V/D/C. Denies bleeding.  A/P: 32 yr. Old AAF w/ hx HL, HTN, IDDM type 2 , hx CVA, crack cocaine use, presented with chest pain, s/p NSTEMI.  1. NSTEMI: Continue heparin gtt as recommended by Card, ASA, statin, ACE-i. NTG SL as needed for CP. Stable and CP free. She is pending CABG. Follow H/H, PTT while on Heparin gtt. 2. IDDM type 2: Spikes due to patient not following card diet. Decrease her Lantus at night by at least 25% tonight given NPO status p MN. Educated on diet. 3. Fever: No clear cause/source and no reoccurrence. Monitor for now. 4. HTN: Continue current tx w/ lisinopril and coreg. 5. Pre-renal/recent AKI: This is resolved, monitor in setting of ACE-1. 6. Rest per resident physician note.  Dominic Pea, DO  07/08/2012, 1:54 PM

## 2012-07-08 NOTE — Progress Notes (Signed)
CARDIAC REHAB PHASE I   PRE:  Rate/Rhythm: 61 SR    BP: sitting 100/66    SaO2: 96 RA  MODE:  Ambulation: 540 ft   POST:  Rate/Rhythm: 86    BP: sitting 146/66     SaO2: 99 RA  Tolerated well. Started wheezing on return trip, like she needed to cough. Coughing didn't make this better. Pt seemed used to this noise/sx. VSS. Pt sts she is ready for surgery. Encouraged IS and walking. [place], [place], ACSM

## 2012-07-08 NOTE — Clinical Social Work Psychosocial (Signed)
     Clinical Social Work Department BRIEF PSYCHOSOCIAL ASSESSMENT 07/08/2012  Patient:  [name] [name], [name] [name]     Account Number:  [id number]     Admit date:  06/29/2012  Clinical Social Worker:  Daiva Huge  Date/Time:  07/08/2012 02:38 PM  Referred by:  Physician  Date Referred:  07/08/2012 Referred for  Substance Abuse   Other Referral:   Interview type:  Patient Other interview type:    PSYCHOSOCIAL DATA Living Status:  FRIEND(S) Admitted from facility:   Level of care:   Primary support name:  REGINALD Primary support relationship to patient:  FRIEND Degree of support available:   Patient resides with her boyfriend of 17 years and they live at [facility]. SHe plans to return there ar d/c and she will have the support of her boyfriend and other friends/family to provide 24 hr care. She also has a Physiological scientist who comes 3 hrs day/5 days aweek which will be increased per her report to 8hrs day.    CURRENT CONCERNS Current Concerns  Substance Abuse   Other Concerns:    SOCIAL WORK ASSESSMENT / PLAN Patient is prepared emotionally, sprirtually and physically for her triple bypass tomorrow- she has a strong faith as well as a strong support network of friends and family who will assist her at home post op.  A visitor arrived and interrupted our conversation prio ro me being able to discuss substance use with her- will ask CSW covering post op to visit and counsel her regarding the recent reported drug use.   Assessment/plan status:  Other - See comment Other assessment/ plan:   Information/referral to community resources:   [employer], Substance abuse information, other as identified    PATIENTS/FAMILYS RESPONSE TO PLAN OF CARE: patient receptive and appreciaitive of visit

## 2012-07-08 NOTE — Progress Notes (Signed)
This CSW has transferred this patient to Reece Packer, LCSW, for further planning and support. Eduard Clos, MSW, [place]

## 2012-07-08 NOTE — Progress Notes (Signed)
I have read and agree with the above. Please read separate note. Rosalia Hammers, MD

## 2012-07-08 NOTE — Progress Notes (Signed)
Resident Co-sign Daily Note: I have seen the patient and reviewed the daily progress note by Rosamaria Lints  MS IV and discussed the care of the patient with them.  See below for documentation of my findings, assessment, and plans.  Subjective:  Patient feels great. She denies any chest pain, SOB, abdominal pain. She noted that she ate shrimp fried rice and spring rolls as well drank dr Malachi Bonds yesterday.  Objective: Vital signs in last 24 hours: Filed Vitals:   07/07/12 1356 07/07/12 1647 07/07/12 2027 07/08/12 0355  BP: 91/51 132/74 92/56 124/75  Pulse: 55 61 64 69  Temp: 98.9 F (37.2 C)  99.3 F (37.4 C) 97.7 F (36.5 C)  TempSrc: Oral  Oral Oral  Resp: 18  20 16   Height:      Weight:    278 lb 3.2 oz (126.191 kg)  SpO2: 95%  94% 99%   Physical Exam:  Constitutional: Vital signs reviewed.  Patient is a well-developed and well-nourished  in no acute distress and cooperative with exam. Alert and oriented x3.  Cardiovascular: RRR, S1 normal, S2 normal,  Pulmonary/Chest: CTAB, no wheezes, rales, or rhonchi Abdominal: Soft. Non-tender, non-distended, bowel sounds are normal,  Neurological: A&O x3,  Lab Results: Reviewed and documented in Electronic Record Micro Results: Reviewed and documented in Electronic Record Studies/Results: Reviewed and documented in Electronic Record Medications: I have reviewed the patient's current medications. Scheduled Meds:   . aspirin EC  325 mg Oral Daily  . atorvastatin  80 mg Oral q1800  . bisacodyl  5 mg Oral Once  . brinzolamide  1 drop Both Eyes TID  . carvedilol  6.25 mg Oral BID WC  . cefUROXime (ZINACEF)  IV  1.5 g Intravenous To OR  . cefUROXime (ZINACEF)  IV  750 mg Intravenous To OR  . chlorhexidine  60 mL Topical Once  . dexmedetomidine  0.1-0.7 mcg/kg/hr Intravenous To OR  . DOPamine  2-20 mcg/kg/min Intravenous To OR  . epinephrine  0.5-20 mcg/min Intravenous To OR  . insulin aspart  0-20 Units Subcutaneous TID WC  .  insulin aspart  0-5 Units Subcutaneous QHS  . insulin aspart  11 Units Subcutaneous TID WC  . insulin glargine  35 Units Subcutaneous QHS  . insulin (NOVOLIN-R) infusion   Intravenous To OR  . latanoprost  1 drop Both Eyes QHS  . lisinopril  2.5 mg Oral Daily  . magnesium sulfate  40 mEq Other To OR  . metoprolol tartrate  12.5 mg Oral Once  . nitroGLYCERIN  2 inch Topical Q6H  . nitroGLYCERIN  2-200 mcg/min Intravenous To OR  . nitroglycerin-nicardipine-HEPARIN-sodium bicarbonate irrigation for artery spasm   Irrigation To OR  . pantoprazole  40 mg Oral Q0600  . phenylephrine (NEO-SYNEPHRINE) Adult infusion  30-200 mcg/min Intravenous To OR  . polyethylene glycol  17 g Oral Daily  . potassium chloride  80 mEq Other To OR  . senna-docusate  1 tablet Oral BID  . tranexamic acid  15 mg/kg Intravenous To OR  . tranexamic acid  2 mg/kg Intracatheter To OR  . tranexamic acid (CYKLOKAPRON) infusion (OHS)  1.5 mg/kg/hr Intravenous To OR  . vancomycin  1,250 mg Intravenous To OR  . DISCONTD: bisacodyl  5 mg Oral Once  . DISCONTD: chlorhexidine  60 mL Topical Once  . DISCONTD: insulin glargine  47 Units Subcutaneous QHS   Continuous Infusions:   . sodium chloride 10 mL/hr (06/29/12 2212)  . heparin 1,650 Units/hr (07/08/12 [id number])  PRN Meds:.acetaminophen, dextrose, diazepam, LORazepam, morphine injection, nitroGLYCERIN, ondansetron (ZOFRAN) IV, temazepam Assessment/Plan:  This is a 61 y/o woman with history of CVA, DM, HTN, HLD, and cocaine admitted on 07/02 with NSTEMI due to Cocaine.  # NSTEMI in the setting of cocaine abuse: Cardiac cath revealed 3 vessel disease and patient will therefore undergo CABG on 7/12. Patient is currently on Nitro paste, Heparin drip, Aspirin, Coreg and Lisinopril.  - Continue current regimen  - Will discuss with Cardiology if Heparin drip needs to be continued  - Appreciate Cardiology assistance  - CABG on 7/12   #DM, type 2 uncontrolled with Hgb A1c of  10.5.  - We will continue current regimen including SSI resistent with meal coverage but will decrease to Lantus 35 units in the setting of NPO after midnight and surgery tomorrow.   #Acute renal failure in the setting of ACEi and volume depletion- resolved.  - Will continue to monitor   # Leukocytosis: resolved. She was noted to have Trichomonas . Was treated with Flagyl   # Hyperlipidemia; continue Lipitor 80   #Hypertension; well controlled  - continue current regimen including Lisinopril, Coreg   #Cocaine abuse: SW consult for counseling in place . We have discussed extensively about complication and treatment options   #DVT: Heparin drip   #Dispo: CABG on 7/12    LOS: 9 days   Andry Bogden 07/08/2012, 1:28 PM

## 2012-07-08 NOTE — Progress Notes (Signed)
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  No Known Allergies  Patient Measurements: Height: 5\' 8"  (172.7 cm) Weight: 278 lb 3.2 oz (126.191 kg) IBW/kg (Calculated) : 63.9  Heparin Dosing Weight: 91.2 kg  Vital Signs: Temp: 97.8 F (36.6 C) (07/11 1337) Temp src: Oral (07/11 0355) BP: 122/73 mmHg (07/11 1337) Pulse Rate: 72  (07/11 1337)  Labs:  Basename 07/08/12 1434 07/08/12 0500 07/07/12 0550 07/06/12 0555  HGB -- 10.9* 11.2* --  HCT -- 33.6* 33.8* 33.6*  PLT -- 283 265 245  APTT -- -- -- --  LABPROT -- -- -- --  INR -- -- -- --  HEPARINUNFRC 0.46 0.18* 0.47 --  CREATININE -- 1.06 1.07 0.97  CKTOTAL -- -- -- --  CKMB -- -- -- --  TROPONINI -- -- -- --    Estimated Creatinine Clearance: 78.1 ml/min (by C-G formula based on Cr of 1.06).   Medications:  Infusions:     . sodium chloride 10 mL/hr (06/29/12 2212)  . heparin 1,650 Units/hr (07/08/12 [id number])    Assessment: 61 y.o. 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 Heparin level therapeutic now. No issues with gtt per RN. Hgb/Hct/Plt stable. No s/sx of bleeding noted.   Goal of Therapy:  Heparin level 0.3-0.7 units/ml Monitor platelets by anticoagulation protocol: Yes   Plan:  1. Cont heparin at 1650 units/hr 2. F/u with level in AM

## 2012-07-09 ENCOUNTER — Inpatient Hospital Stay (HOSPITAL_COMMUNITY): Payer: Medicaid Other

## 2012-07-09 ENCOUNTER — Encounter (HOSPITAL_COMMUNITY): Payer: Self-pay | Admitting: Anesthesiology

## 2012-07-09 ENCOUNTER — Encounter (HOSPITAL_COMMUNITY)
Admission: EM | Disposition: A | Discharge status: Home / Self Care | Payer: Self-pay | Source: Home / Self Care | Attending: Internal Medicine

## 2012-07-09 ENCOUNTER — Inpatient Hospital Stay (HOSPITAL_COMMUNITY): Payer: Medicaid Other | Admitting: Anesthesiology

## 2012-07-09 DIAGNOSIS — I2510 Atherosclerotic heart disease of native coronary artery without angina pectoris: Secondary | ICD-10-CM

## 2012-07-09 HISTORY — PX: CORONARY ARTERY BYPASS GRAFT: SHX141

## 2012-07-09 LAB — CBC
HCT: 32.6 % — ABNORMAL LOW (ref 36.0–46.0)
Hemoglobin: 10.7 g/dL — ABNORMAL LOW (ref 12.0–15.0)
MCH: 26 pg (ref 26.0–34.0)
MCHC: 32.8 g/dL (ref 30.0–36.0)
MCV: 79.6 fL (ref 78.0–100.0)
MCV: 79.1 fL (ref 78.0–100.0)
Platelets: 209 10*3/uL (ref 150–400)
Platelets: 217 10*3/uL (ref 150–400)
RBC: 4.06 MIL/uL (ref 3.87–5.11)
RBC: 4.12 MIL/uL (ref 3.87–5.11)
RDW: 17.3 % — ABNORMAL HIGH (ref 11.5–15.5)
RDW: 17.5 % — ABNORMAL HIGH (ref 11.5–15.5)
WBC: 14.4 10*3/uL — ABNORMAL HIGH (ref 4.0–10.5)
WBC: 15.7 10*3/uL — ABNORMAL HIGH (ref 4.0–10.5)

## 2012-07-09 LAB — POCT I-STAT 3, ART BLOOD GAS (G3+)
Acid-Base Excess: 3 mmol/L — ABNORMAL HIGH (ref 0.0–2.0)
Acid-base deficit: 7 mmol/L — ABNORMAL HIGH (ref 0.0–2.0)
Acid-base deficit: 1 mmol/L (ref 0.0–2.0)
Acid-base deficit: 1 mmol/L (ref 0.0–2.0)
Bicarbonate: 23.8 mEq/L (ref 20.0–24.0)
Bicarbonate: 24.6 mEq/L — ABNORMAL HIGH (ref 20.0–24.0)
O2 Saturation: 100 %
O2 Saturation: 95 %
O2 Saturation: 95 %
O2 Saturation: 94 %
pCO2 arterial: 39.2 mmHg (ref 35.0–45.0)
pCO2 arterial: 52 mmHg — ABNORMAL HIGH (ref 35.0–45.0)
pH, Arterial: 7.398 (ref 7.350–7.450)
pH, Arterial: 7.213 — ABNORMAL LOW (ref 7.350–7.450)
pO2, Arterial: 370 mmHg — ABNORMAL HIGH (ref 80.0–100.0)
pO2, Arterial: 240 mmHg — ABNORMAL HIGH (ref 80.0–100.0)
pO2, Arterial: 72 mmHg — ABNORMAL LOW (ref 80.0–100.0)
pO2, Arterial: 94 mmHg (ref 80.0–100.0)
pO2, Arterial: 74 mmHg — ABNORMAL LOW (ref 80.0–100.0)

## 2012-07-09 LAB — HEMOGLOBIN AND HEMATOCRIT, BLOOD
HCT: 25.9 % — ABNORMAL LOW (ref 36.0–46.0)
Hemoglobin: 8.4 g/dL — ABNORMAL LOW (ref 12.0–15.0)

## 2012-07-09 LAB — POCT I-STAT, CHEM 8
Creatinine, Ser: 0.9 mg/dL (ref 0.50–1.10)
Glucose, Bld: 151 mg/dL — ABNORMAL HIGH (ref 70–99)
Hemoglobin: 10.5 g/dL — ABNORMAL LOW (ref 12.0–15.0)
Sodium: 142 mEq/L (ref 135–145)
TCO2: 23 mmol/L (ref 0–100)

## 2012-07-09 LAB — POCT I-STAT 4, (NA,K, GLUC, HGB,HCT)
Glucose, Bld: 136 mg/dL — ABNORMAL HIGH (ref 70–99)
Glucose, Bld: 104 mg/dL — ABNORMAL HIGH (ref 70–99)
HCT: 26 % — ABNORMAL LOW (ref 36.0–46.0)
HCT: 27 % — ABNORMAL LOW (ref 36.0–46.0)
HCT: 31 % — ABNORMAL LOW (ref 36.0–46.0)
Hemoglobin: 10.5 g/dL — ABNORMAL LOW (ref 12.0–15.0)
Hemoglobin: 10.2 g/dL — ABNORMAL LOW (ref 12.0–15.0)
Hemoglobin: 9.2 g/dL — ABNORMAL LOW (ref 12.0–15.0)
Potassium: 3.9 mEq/L (ref 3.5–5.1)
Potassium: 3.3 mEq/L — ABNORMAL LOW (ref 3.5–5.1)
Potassium: 4.6 mEq/L (ref 3.5–5.1)
Sodium: 143 mEq/L (ref 135–145)
Sodium: 142 mEq/L (ref 135–145)
Sodium: 143 mEq/L (ref 135–145)
Sodium: 144 mEq/L (ref 135–145)

## 2012-07-09 LAB — HEPATITIS PANEL, ACUTE
HCV Ab: NEGATIVE
Hep A IgM: NEGATIVE
Hep B C IgM: NEGATIVE
Hepatitis B Surface Ag: NEGATIVE

## 2012-07-09 LAB — APTT: aPTT: 30 seconds (ref 24–37)

## 2012-07-09 LAB — CREATININE, SERUM
Creatinine, Ser: 0.77 mg/dL (ref 0.50–1.10)
GFR calc Af Amer: >90 mL/min (ref >90)
GFR calc non Af Amer: 89 mL/min — ABNORMAL LOW (ref >90)

## 2012-07-09 LAB — PLATELET COUNT: Platelets: 211 10*3/uL (ref 150–400)

## 2012-07-09 LAB — PROTIME-INR: Prothrombin Time: 16.8 seconds — ABNORMAL HIGH (ref 11.6–15.2)

## 2012-07-09 LAB — POCT I-STAT GLUCOSE: Operator id: 284731

## 2012-07-09 LAB — MAGNESIUM: Magnesium: 2.6 mg/dL — ABNORMAL HIGH (ref 1.5–2.5)

## 2012-07-09 SURGERY — CORONARY ARTERY BYPASS GRAFTING (CABG)
Anesthesia: General | Site: Chest | Wound class: Clean

## 2012-07-09 MED ORDER — ONDANSETRON HCL 4 MG/2ML IJ SOLN: 4.0000 mg | Freq: Four times a day (QID) | INTRAMUSCULAR | Status: DC | PRN

## 2012-07-09 MED ORDER — LEVALBUTEROL HCL 1.25 MG/0.5ML IN NEBU
1.2500 mg | INHALATION_SOLUTION | Freq: Four times a day (QID) | RESPIRATORY_TRACT | Status: DC
  Administered 2012-07-09 – 2012-07-15 (×19): 1.25 mg via RESPIRATORY_TRACT
  Filled 2012-07-09 (×28): qty 0.5

## 2012-07-09 MED ORDER — MORPHINE SULFATE 2 MG/ML IJ SOLN: 1.0000 mg | INTRAMUSCULAR | Status: AC | PRN

## 2012-07-09 MED ORDER — HYDROMORPHONE HCL PF 1 MG/ML IJ SOLN: 0.2500 mg | INTRAMUSCULAR | Status: DC | PRN

## 2012-07-09 MED ORDER — SODIUM CHLORIDE 0.45 % IV SOLN
INTRAVENOUS | Status: DC
  Administered 2012-07-09: 20 mL via INTRAVENOUS

## 2012-07-09 MED ORDER — METOPROLOL TARTRATE 1 MG/ML IV SOLN: 2.5000 mg | INTRAVENOUS | Status: DC | PRN

## 2012-07-09 MED ORDER — ROCURONIUM BROMIDE 100 MG/10ML IV SOLN
INTRAVENOUS | Status: DC | PRN
  Administered 2012-07-09: 100 mg via INTRAVENOUS

## 2012-07-09 MED ORDER — PHENYLEPHRINE HCL 10 MG/ML IJ SOLN
0.0000 ug/min | INTRAVENOUS | Status: DC
  Filled 2012-07-09: qty 2

## 2012-07-09 MED ORDER — 0.9 % SODIUM CHLORIDE (POUR BTL) OPTIME
TOPICAL | Status: DC | PRN
  Administered 2012-07-09: 6000 mL

## 2012-07-09 MED ORDER — HEPARIN SODIUM (PORCINE) 1000 UNIT/ML IJ SOLN
INTRAMUSCULAR | Status: AC
  Filled 2012-07-09: qty 1

## 2012-07-09 MED ORDER — PANTOPRAZOLE SODIUM 40 MG PO TBEC
40.0000 mg | DELAYED_RELEASE_TABLET | Freq: Every day | ORAL | Status: DC
  Administered 2012-07-11 – 2012-07-15 (×5): 40 mg via ORAL
  Filled 2012-07-09 (×5): qty 1

## 2012-07-09 MED ORDER — LACTATED RINGERS IV SOLN: 500.0000 mL | Freq: Once | INTRAVENOUS | Status: AC | PRN

## 2012-07-09 MED ORDER — TRANEXAMIC ACID 100 MG/ML IV SOLN
1.5000 mg/kg/h | INTRAVENOUS | Status: DC
  Filled 2012-07-09: qty 25

## 2012-07-09 MED ORDER — LACTATED RINGERS IV SOLN
INTRAVENOUS | Status: DC | PRN
  Administered 2012-07-09 (×2): via INTRAVENOUS

## 2012-07-09 MED ORDER — MIDAZOLAM HCL 2 MG/2ML IJ SOLN: 2.0000 mg | INTRAMUSCULAR | Status: DC | PRN

## 2012-07-09 MED ORDER — NITROGLYCERIN IN D5W 200-5 MCG/ML-% IV SOLN
0.0000 ug/min | INTRAVENOUS | Status: DC
  Administered 2012-07-09: 20 ug/min via INTRAVENOUS

## 2012-07-09 MED ORDER — TRAMADOL HCL 50 MG PO TABS: 50.0000 mg | ORAL_TABLET | Freq: Four times a day (QID) | ORAL | Status: DC | PRN

## 2012-07-09 MED ORDER — SODIUM CHLORIDE 0.9 % IJ SOLN
3.0000 mL | Freq: Two times a day (BID) | INTRAMUSCULAR | Status: DC
  Administered 2012-07-10 – 2012-07-11 (×4): 3 mL via INTRAVENOUS

## 2012-07-09 MED ORDER — CEFUROXIME SODIUM 1.5 G IJ SOLR
1.5000 g | Freq: Two times a day (BID) | INTRAMUSCULAR | Status: AC
  Administered 2012-07-09 – 2012-07-11 (×4): 1.5 g via INTRAVENOUS
  Filled 2012-07-09 (×4): qty 1.5

## 2012-07-09 MED ORDER — SODIUM BICARBONATE 8.4 % IV SOLN
INTRAVENOUS | Status: AC
  Administered 2012-07-09: 10:00:00
  Filled 2012-07-09: qty 2.5

## 2012-07-09 MED ORDER — MILRINONE IN DEXTROSE 200-5 MCG/ML-% IV SOLN
INTRAVENOUS | Status: DC | PRN
  Administered 2012-07-09: .3 ug/kg/min via INTRAVENOUS

## 2012-07-09 MED ORDER — DOPAMINE-DEXTROSE 3.2-5 MG/ML-% IV SOLN
2.5000 ug/kg/min | INTRAVENOUS | Status: DC
  Administered 2012-07-09: 2.5 ug/kg/min via INTRAVENOUS
  Filled 2012-07-09: qty 250

## 2012-07-09 MED ORDER — METOPROLOL TARTRATE 12.5 MG HALF TABLET
12.5000 mg | ORAL_TABLET | Freq: Two times a day (BID) | ORAL | Status: DC
  Administered 2012-07-11 – 2012-07-15 (×7): 12.5 mg via ORAL
  Filled 2012-07-09 (×13): qty 1

## 2012-07-09 MED ORDER — DOCUSATE SODIUM 100 MG PO CAPS
200.0000 mg | ORAL_CAPSULE | Freq: Every day | ORAL | Status: DC
  Administered 2012-07-10 – 2012-07-13 (×4): 200 mg via ORAL
  Filled 2012-07-09 (×6): qty 2

## 2012-07-09 MED ORDER — MAGNESIUM SULFATE 40 MG/ML IJ SOLN
4.0000 g | Freq: Once | INTRAMUSCULAR | Status: AC
  Administered 2012-07-09: 4 g via INTRAVENOUS
  Filled 2012-07-09: qty 100

## 2012-07-09 MED ORDER — SODIUM CHLORIDE 0.9 % IV SOLN
INTRAVENOUS | Status: DC | PRN
  Administered 2012-07-09: 13:00:00 via INTRAVENOUS

## 2012-07-09 MED ORDER — INSULIN REGULAR BOLUS VIA INFUSION
0.0000 [IU] | Freq: Three times a day (TID) | INTRAVENOUS | Status: DC
  Filled 2012-07-09: qty 10

## 2012-07-09 MED ORDER — ALBUMIN HUMAN 5 % IV SOLN
INTRAVENOUS | Status: AC
  Filled 2012-07-09: qty 250

## 2012-07-09 MED ORDER — METOPROLOL TARTRATE 25 MG/10 ML ORAL SUSPENSION
12.5000 mg | Freq: Two times a day (BID) | ORAL | Status: DC
  Filled 2012-07-09 (×7): qty 5

## 2012-07-09 MED ORDER — POTASSIUM CHLORIDE 10 MEQ/50ML IV SOLN
10.0000 meq | INTRAVENOUS | Status: AC
  Administered 2012-07-09 (×3): 10 meq via INTRAVENOUS

## 2012-07-09 MED ORDER — ACETAMINOPHEN 650 MG RE SUPP
650.0000 mg | RECTAL | Status: AC
  Administered 2012-07-09: 650 mg via RECTAL

## 2012-07-09 MED ORDER — DOPAMINE-DEXTROSE 3.2-5 MG/ML-% IV SOLN
INTRAVENOUS | Status: DC | PRN
  Administered 2012-07-09: 3 ug/kg/min via INTRAVENOUS

## 2012-07-09 MED ORDER — BISACODYL 10 MG RE SUPP: 10.0000 mg | Freq: Every day | RECTAL | Status: DC

## 2012-07-09 MED ORDER — SODIUM CHLORIDE 0.9 % IV SOLN
INTRAVENOUS | Status: DC
  Administered 2012-07-09: 20 mL via INTRAVENOUS

## 2012-07-09 MED ORDER — ACETAMINOPHEN 160 MG/5ML PO SOLN: 975.0000 mg | Freq: Four times a day (QID) | ORAL | Status: DC

## 2012-07-09 MED ORDER — FLUTICASONE-SALMETEROL 250-50 MCG/DOSE IN AEPB
1.0000 | INHALATION_SPRAY | Freq: Two times a day (BID) | RESPIRATORY_TRACT | Status: DC
  Administered 2012-07-10 – 2012-07-15 (×11): 1 via RESPIRATORY_TRACT
  Filled 2012-07-09: qty 14

## 2012-07-09 MED ORDER — HEPARIN SODIUM (PORCINE) 1000 UNIT/ML IJ SOLN
INTRAMUSCULAR | Status: DC | PRN
  Administered 2012-07-09: 40000 [IU] via INTRAVENOUS
  Administered 2012-07-09: 5000 [IU] via INTRAVENOUS

## 2012-07-09 MED ORDER — ASPIRIN EC 325 MG PO TBEC
325.0000 mg | DELAYED_RELEASE_TABLET | Freq: Every day | ORAL | Status: DC
  Administered 2012-07-11 – 2012-07-12 (×2): 325 mg via ORAL
  Filled 2012-07-09 (×4): qty 1

## 2012-07-09 MED ORDER — SODIUM CHLORIDE 0.9 % IV SOLN: 250.0000 mL | INTRAVENOUS | Status: DC

## 2012-07-09 MED ORDER — SODIUM CHLORIDE 0.9 % IJ SOLN: 3.0000 mL | INTRAMUSCULAR | Status: DC | PRN

## 2012-07-09 MED ORDER — ACETAMINOPHEN 500 MG PO TABS
1000.0000 mg | ORAL_TABLET | Freq: Four times a day (QID) | ORAL | Status: DC
  Administered 2012-07-10 – 2012-07-12 (×8): 1000 mg via ORAL
  Filled 2012-07-09 (×12): qty 2

## 2012-07-09 MED ORDER — SODIUM CHLORIDE 0.9 % IV SOLN
INTRAVENOUS | Status: AC
  Administered 2012-07-10: 2.4 [IU]/h via INTRAVENOUS
  Filled 2012-07-09 (×2): qty 1

## 2012-07-09 MED ORDER — LACTATED RINGERS IV SOLN
INTRAVENOUS | Status: DC
  Administered 2012-07-09: 60 mL via INTRAVENOUS

## 2012-07-09 MED ORDER — METHYLPREDNISOLONE SODIUM SUCC 40 MG IJ SOLR
40.0000 mg | Freq: Once | INTRAMUSCULAR | Status: AC
  Administered 2012-07-09: 40 mg via INTRAVENOUS
  Filled 2012-07-09: qty 1

## 2012-07-09 MED ORDER — FAMOTIDINE IN NACL 20-0.9 MG/50ML-% IV SOLN
20.0000 mg | Freq: Two times a day (BID) | INTRAVENOUS | Status: AC
  Administered 2012-07-09: 20 mg via INTRAVENOUS

## 2012-07-09 MED ORDER — VANCOMYCIN HCL IN DEXTROSE 1-5 GM/200ML-% IV SOLN
1000.0000 mg | Freq: Once | INTRAVENOUS | Status: DC
  Filled 2012-07-09: qty 200

## 2012-07-09 MED ORDER — VECURONIUM BROMIDE 10 MG IV SOLR
INTRAVENOUS | Status: DC | PRN
  Administered 2012-07-09 (×4): 5 mg via INTRAVENOUS

## 2012-07-09 MED ORDER — MIDAZOLAM HCL 5 MG/5ML IJ SOLN
INTRAMUSCULAR | Status: DC | PRN
  Administered 2012-07-09: 4 mg via INTRAVENOUS
  Administered 2012-07-09: 2 mg via INTRAVENOUS
  Administered 2012-07-09: 3 mg via INTRAVENOUS
  Administered 2012-07-09 (×2): 2 mg via INTRAVENOUS

## 2012-07-09 MED ORDER — FENTANYL CITRATE 0.05 MG/ML IJ SOLN
INTRAMUSCULAR | Status: DC | PRN
  Administered 2012-07-09: 100 ug via INTRAVENOUS
  Administered 2012-07-09: 250 ug via INTRAVENOUS
  Administered 2012-07-09: 950 ug via INTRAVENOUS
  Administered 2012-07-09 (×2): 100 ug via INTRAVENOUS
  Administered 2012-07-09: 200 ug via INTRAVENOUS
  Administered 2012-07-09: 50 ug via INTRAVENOUS

## 2012-07-09 MED ORDER — ONDANSETRON HCL 4 MG/2ML IJ SOLN: 4.0000 mg | Freq: Once | INTRAMUSCULAR | Status: AC | PRN

## 2012-07-09 MED ORDER — DEXMEDETOMIDINE HCL IN NACL 200 MCG/50ML IV SOLN
0.1000 ug/kg/h | INTRAVENOUS | Status: DC
  Filled 2012-07-09: qty 50

## 2012-07-09 MED ORDER — ASPIRIN 81 MG PO CHEW: 324.0000 mg | CHEWABLE_TABLET | Freq: Every day | ORAL | Status: DC

## 2012-07-09 MED ORDER — SUFENTANIL CITRATE 50 MCG/ML IV SOLN
INTRAVENOUS | Status: DC | PRN
  Administered 2012-07-09 (×5): 10 ug via INTRAVENOUS

## 2012-07-09 MED ORDER — MILRINONE IN DEXTROSE 200-5 MCG/ML-% IV SOLN
0.3750 ug/kg/min | INTRAVENOUS | Status: DC
  Administered 2012-07-09 – 2012-07-10 (×3): 0.375 ug/kg/min via INTRAVENOUS
  Filled 2012-07-09 (×3): qty 100

## 2012-07-09 MED ORDER — ACETAMINOPHEN 160 MG/5ML PO SOLN: 650.0000 mg | ORAL | Status: AC

## 2012-07-09 MED ORDER — EPHEDRINE SULFATE 50 MG/ML IJ SOLN
INTRAMUSCULAR | Status: DC | PRN
  Administered 2012-07-09: 5 mg via INTRAVENOUS
  Administered 2012-07-09: 10 mg via INTRAVENOUS

## 2012-07-09 MED ORDER — ALBUMIN HUMAN 5 % IV SOLN: 250.0000 mL | INTRAVENOUS | Status: AC | PRN

## 2012-07-09 MED ORDER — VANCOMYCIN HCL IN DEXTROSE 1-5 GM/200ML-% IV SOLN
1000.0000 mg | Freq: Two times a day (BID) | INTRAVENOUS | Status: AC
  Administered 2012-07-09 – 2012-07-11 (×5): 1000 mg via INTRAVENOUS
  Filled 2012-07-09 (×5): qty 200

## 2012-07-09 MED ORDER — SODIUM BICARBONATE 8.4 % IV SOLN
INTRAVENOUS | Status: AC
  Filled 2012-07-09: qty 50

## 2012-07-09 MED ORDER — PROTAMINE SULFATE 10 MG/ML IV SOLN
INTRAVENOUS | Status: DC | PRN
  Administered 2012-07-09 (×7): 50 mg via INTRAVENOUS

## 2012-07-09 MED ORDER — OXYCODONE HCL 5 MG PO TABS
5.0000 mg | ORAL_TABLET | ORAL | Status: DC | PRN
  Administered 2012-07-10: 5 mg via ORAL
  Administered 2012-07-10 – 2012-07-11 (×2): 10 mg via ORAL
  Administered 2012-07-12: 5 mg via ORAL
  Administered 2012-07-14: 10 mg via ORAL
  Filled 2012-07-09: qty 1
  Filled 2012-07-09: qty 2
  Filled 2012-07-09: qty 1
  Filled 2012-07-09: qty 2

## 2012-07-09 MED ORDER — MORPHINE SULFATE 2 MG/ML IJ SOLN
2.0000 mg | INTRAMUSCULAR | Status: DC | PRN
  Administered 2012-07-10: 2 mg via INTRAVENOUS
  Filled 2012-07-09: qty 1

## 2012-07-09 MED ORDER — LIDOCAINE HCL (CARDIAC) 20 MG/ML IV SOLN
INTRAVENOUS | Status: AC
  Filled 2012-07-09: qty 5

## 2012-07-09 MED ORDER — PROPOFOL 10 MG/ML IV EMUL
INTRAVENOUS | Status: DC | PRN
  Administered 2012-07-09: 50 mg via INTRAVENOUS

## 2012-07-09 MED ORDER — HEMOSTATIC AGENTS (NO CHARGE) OPTIME
TOPICAL | Status: DC | PRN
  Administered 2012-07-09: 3 via TOPICAL
  Administered 2012-07-09: 1 via TOPICAL

## 2012-07-09 MED ORDER — BISACODYL 5 MG PO TBEC
10.0000 mg | DELAYED_RELEASE_TABLET | Freq: Every day | ORAL | Status: DC
  Administered 2012-07-10 – 2012-07-12 (×3): 10 mg via ORAL
  Filled 2012-07-09 (×4): qty 2

## 2012-07-09 MED ORDER — ALBUMIN HUMAN 5 % IV SOLN
INTRAVENOUS | Status: DC | PRN
  Administered 2012-07-09 (×2): via INTRAVENOUS

## 2012-07-09 MED ORDER — DEXMEDETOMIDINE HCL IN NACL 400 MCG/100ML IV SOLN
0.4000 ug/kg/h | INTRAVENOUS | Status: DC
  Administered 2012-07-09: 0.7 ug/kg/h via INTRAVENOUS
  Filled 2012-07-09: qty 100

## 2012-07-09 SURGICAL SUPPLY — 137 items
ADAPTER CARDIO PERF ANTE/RETRO (ADAPTER) ×3 IMPLANT
ADPR PRFSN 84XANTGRD RTRGD (ADAPTER) ×1
APL SKNCLS STERI-STRIP NONHPOA (GAUZE/BANDAGES/DRESSINGS) ×1
APPLIER CLIP 9.375 MED OPEN (MISCELLANEOUS)
APPLIER CLIP 9.375 SM OPEN (CLIP)
APR CLP MED 9.3 20 MLT OPN (MISCELLANEOUS)
APR CLP SM 9.3 20 MLT OPN (CLIP)
ATTRACTOMAT 16X20 MAGNETIC DRP (DRAPES) ×3 IMPLANT
BAG DECANTER FOR FLEXI CONT (MISCELLANEOUS) ×3 IMPLANT
BANDAGE ELASTIC 4 VELCRO ST LF (GAUZE/BANDAGES/DRESSINGS) ×5 IMPLANT
BANDAGE ELASTIC 6 VELCRO ST LF (GAUZE/BANDAGES/DRESSINGS) ×5 IMPLANT
BANDAGE GAUZE ELAST BULKY 4 IN (GAUZE/BANDAGES/DRESSINGS) ×5 IMPLANT
BASKET HEART  (ORDER IN 25'S) (MISCELLANEOUS) ×1
BASKET HEART (ORDER IN 25'S) (MISCELLANEOUS) ×1
BASKET HEART (ORDER IN 25S) (MISCELLANEOUS) ×1 IMPLANT
BENZOIN TINCTURE PRP APPL 2/3 (GAUZE/BANDAGES/DRESSINGS) ×2 IMPLANT
BLADE STERNUM SYSTEM 6 (BLADE) ×3 IMPLANT
BLADE SURG 11 STRL SS (BLADE) ×2 IMPLANT
BLADE SURG 12 STRL SS (BLADE) ×3 IMPLANT
BLADE SURG ROTATE 9660 (MISCELLANEOUS) IMPLANT
CANISTER SUCTION 2500CC (MISCELLANEOUS) ×3 IMPLANT
CANNULA AORTIC HI-FLOW 6.5M20F (CANNULA) ×3 IMPLANT
CANNULA ARTERIAL NVNT 3/8 22FR (MISCELLANEOUS) ×2 IMPLANT
CANNULA GUNDRY RCSP 15FR (MISCELLANEOUS) ×3 IMPLANT
CANNULA VENOUS MAL SGL STG 40 (MISCELLANEOUS) IMPLANT
CANNULAE VENOUS MAL SGL STG 40 (MISCELLANEOUS)
CATH CPB KIT VANTRIGT (MISCELLANEOUS) ×3 IMPLANT
CATH ROBINSON RED A/P 18FR (CATHETERS) ×9 IMPLANT
CATH THORACIC 28FR (CATHETERS) IMPLANT
CATH THORACIC 28FR RT ANG (CATHETERS) IMPLANT
CATH THORACIC 36FR (CATHETERS) IMPLANT
CATH THORACIC 36FR RT ANG (CATHETERS) ×6 IMPLANT
CLIP APPLIE 9.375 MED OPEN (MISCELLANEOUS) IMPLANT
CLIP APPLIE 9.375 SM OPEN (CLIP) IMPLANT
CLIP FOGARTY SPRING 6M (CLIP) IMPLANT
CLIP TI MEDIUM 24 (CLIP) IMPLANT
CLIP TI WIDE RED SMALL 24 (CLIP) ×4 IMPLANT
CLOSURE STERI-STRIP 1/2X4 (GAUZE/BANDAGES/DRESSINGS) ×1
CLOTH BEACON ORANGE TIMEOUT ST (SAFETY) ×3 IMPLANT
CLSR STERI-STRIP ANTIMIC 1/2X4 (GAUZE/BANDAGES/DRESSINGS) ×1 IMPLANT
CONN Y 3/8X3/8X3/8  BEN (MISCELLANEOUS)
CONN Y 3/8X3/8X3/8 BEN (MISCELLANEOUS) IMPLANT
COVER SURGICAL LIGHT HANDLE (MISCELLANEOUS) ×6 IMPLANT
CRADLE DONUT ADULT HEAD (MISCELLANEOUS) ×3 IMPLANT
DRAIN CHANNEL 32F RND 10.7 FF (WOUND CARE) ×3 IMPLANT
DRAPE CARDIOVASCULAR INCISE (DRAPES) ×3
DRAPE SLUSH MACHINE 52X66 (DRAPES) IMPLANT
DRAPE SLUSH/WARMER DISC (DRAPES) IMPLANT
DRAPE SRG 135X102X78XABS (DRAPES) ×1 IMPLANT
DRSG COVADERM 4X14 (GAUZE/BANDAGES/DRESSINGS) ×3 IMPLANT
ELECT BLADE 4.0 EZ CLEAN MEGAD (MISCELLANEOUS) ×3
ELECT BLADE 6.5 EXT (BLADE) ×3 IMPLANT
ELECT CAUTERY BLADE 6.4 (BLADE) ×3 IMPLANT
ELECT REM PT RETURN 9FT ADLT (ELECTROSURGICAL) ×6
ELECTRODE BLDE 4.0 EZ CLN MEGD (MISCELLANEOUS) ×1 IMPLANT
ELECTRODE REM PT RTRN 9FT ADLT (ELECTROSURGICAL) ×2 IMPLANT
GLOVE BIO SURGEON STRL SZ 6 (GLOVE) ×8 IMPLANT
GLOVE BIO SURGEON STRL SZ 6.5 (GLOVE) IMPLANT
GLOVE BIO SURGEON STRL SZ7 (GLOVE) IMPLANT
GLOVE BIO SURGEON STRL SZ7.5 (GLOVE) ×6 IMPLANT
GLOVE BIO SURGEONS STRL SZ 6.5 (GLOVE)
GLOVE BIOGEL PI IND STRL 6 (GLOVE) IMPLANT
GLOVE BIOGEL PI IND STRL 6.5 (GLOVE) IMPLANT
GLOVE BIOGEL PI IND STRL 7.0 (GLOVE) IMPLANT
GLOVE BIOGEL PI INDICATOR 6 (GLOVE) ×8
GLOVE BIOGEL PI INDICATOR 6.5 (GLOVE)
GLOVE BIOGEL PI INDICATOR 7.0 (GLOVE)
GLOVE EUDERMIC 7 POWDERFREE (GLOVE) IMPLANT
GLOVE ORTHO TXT STRL SZ7.5 (GLOVE) IMPLANT
GOWN STRL NON-REIN LRG LVL3 (GOWN DISPOSABLE) ×12 IMPLANT
HEMOSTAT POWDER SURGIFOAM 1G (HEMOSTASIS) ×9 IMPLANT
HEMOSTAT SURGICEL 2X14 (HEMOSTASIS) ×3 IMPLANT
INSERT FOGARTY 61MM (MISCELLANEOUS) IMPLANT
INSERT FOGARTY XLG (MISCELLANEOUS) IMPLANT
KIT BASIN OR (CUSTOM PROCEDURE TRAY) ×3 IMPLANT
KIT ROOM TURNOVER OR (KITS) ×3 IMPLANT
KIT SUCTION CATH 14FR (SUCTIONS) ×3 IMPLANT
KIT VASOVIEW W/TROCAR VH 2000 (KITS) ×3 IMPLANT
LEAD PACING MYOCARDI (MISCELLANEOUS) ×3 IMPLANT
MARKER GRAFT CORONARY BYPASS (MISCELLANEOUS) ×9 IMPLANT
NS IRRIG 1000ML POUR BTL (IV SOLUTION) ×17 IMPLANT
PACK OPEN HEART (CUSTOM PROCEDURE TRAY) ×3 IMPLANT
PAD ARMBOARD 7.5X6 YLW CONV (MISCELLANEOUS) ×6 IMPLANT
PENCIL BUTTON HOLSTER BLD 10FT (ELECTRODE) ×3 IMPLANT
PUNCH AORTIC ROTATE 4.0MM (MISCELLANEOUS) ×2 IMPLANT
PUNCH AORTIC ROTATE 4.5MM 8IN (MISCELLANEOUS) IMPLANT
PUNCH AORTIC ROTATE 5MM 8IN (MISCELLANEOUS) IMPLANT
SET CARDIOPLEGIA MPS 5001102 (MISCELLANEOUS) ×2 IMPLANT
SOLUTION ANTI FOG 6CC (MISCELLANEOUS) IMPLANT
SPONGE GAUZE 4X4 12PLY (GAUZE/BANDAGES/DRESSINGS) ×6 IMPLANT
SPONGE INTESTINAL PEANUT (DISPOSABLE) IMPLANT
SPONGE LAP 18X18 X RAY DECT (DISPOSABLE) ×4 IMPLANT
SPONGE LAP 4X18 X RAY DECT (DISPOSABLE) ×2 IMPLANT
SUT BONE WAX W31G (SUTURE) ×3 IMPLANT
SUT MNCRL AB 4-0 PS2 18 (SUTURE) ×4 IMPLANT
SUT PROLENE 3 0 SH DA (SUTURE) ×6 IMPLANT
SUT PROLENE 3 0 SH1 36 (SUTURE) IMPLANT
SUT PROLENE 4 0 RB 1 (SUTURE) ×3
SUT PROLENE 4 0 SH DA (SUTURE) ×3 IMPLANT
SUT PROLENE 4-0 RB1 .5 CRCL 36 (SUTURE) ×1 IMPLANT
SUT PROLENE 5 0 C 1 36 (SUTURE) IMPLANT
SUT PROLENE 6 0 C 1 30 (SUTURE) IMPLANT
SUT PROLENE 6 0 CC (SUTURE) IMPLANT
SUT PROLENE 7 0 BV 1 (SUTURE) IMPLANT
SUT PROLENE 7 0 BV1 MDA (SUTURE) ×2 IMPLANT
SUT PROLENE 7 0 DA (SUTURE) IMPLANT
SUT PROLENE 7.0 RB 3 (SUTURE) ×9 IMPLANT
SUT PROLENE 8 0 BV175 6 (SUTURE) ×2 IMPLANT
SUT PROLENE BLUE 7 0 (SUTURE) ×3 IMPLANT
SUT PROLENE POLY MONO (SUTURE) IMPLANT
SUT SILK  1 MH (SUTURE)
SUT SILK 1 MH (SUTURE) IMPLANT
SUT SILK 2 0 SH CR/8 (SUTURE) IMPLANT
SUT SILK 2 0SH CR/8 30 (SUTURE) ×2 IMPLANT
SUT SILK 3 0 SH CR/8 (SUTURE) IMPLANT
SUT STEEL 6MS V (SUTURE) ×8 IMPLANT
SUT STEEL STERNAL CCS#1 18IN (SUTURE) IMPLANT
SUT STEEL SZ 6 DBL 3X14 BALL (SUTURE) ×3 IMPLANT
SUT VIC AB 1 CTX 18 (SUTURE) IMPLANT
SUT VIC AB 1 CTX 36 (SUTURE) ×6
SUT VIC AB 1 CTX36XBRD ANBCTR (SUTURE) ×2 IMPLANT
SUT VIC AB 2-0 CT1 27 (SUTURE) ×3
SUT VIC AB 2-0 CT1 TAPERPNT 27 (SUTURE) IMPLANT
SUT VIC AB 2-0 CTX 27 (SUTURE) IMPLANT
SUT VIC AB 3-0 SH 27 (SUTURE)
SUT VIC AB 3-0 SH 27X BRD (SUTURE) IMPLANT
SUT VIC AB 3-0 X1 27 (SUTURE) IMPLANT
SUT VICRYL 4-0 PS2 18IN ABS (SUTURE) IMPLANT
SUTURE E-PAK OPEN HEART (SUTURE) ×3 IMPLANT
SYSTEM SAHARA CHEST DRAIN ATS (WOUND CARE) ×3 IMPLANT
TAPE CLOTH SURG 4X10 WHT LF (GAUZE/BANDAGES/DRESSINGS) ×4 IMPLANT
TOWEL OR 17X24 6PK STRL BLUE (TOWEL DISPOSABLE) ×6 IMPLANT
TOWEL OR 17X26 10 PK STRL BLUE (TOWEL DISPOSABLE) ×6 IMPLANT
TRAY FOLEY IC TEMP SENS 14FR (CATHETERS) ×5 IMPLANT
TUBING INSUFFLATION 10FT LAP (TUBING) ×3 IMPLANT
UNDERPAD 30X30 INCONTINENT (UNDERPADS AND DIAPERS) ×3 IMPLANT
WATER STERILE IRR 1000ML POUR (IV SOLUTION) ×6 IMPLANT

## 2012-07-09 NOTE — Progress Notes (Signed)
  Echocardiogram Echocardiogram Transesophageal has been performed.  Mauricio Po 07/09/2012, 8:32 AM

## 2012-07-09 NOTE — Transfer of Care (Signed)
Immediate Anesthesia Transfer of Care Note  Patient: [name] [name]  Procedure(s) Performed: Procedure(s) (LRB): CORONARY ARTERY BYPASS GRAFTING (CABG) (N/A)  Patient Location: SICU  Anesthesia Type: General  Level of Consciousness: Patient remains intubated per anesthesia plan  Airway & Oxygen Therapy: Patient remains intubated per anesthesia plan and Patient placed on Ventilator (see vital sign flow sheet for setting)  Post-op Assessment: Report given to PACU RN  Post vital signs: Reviewed  Complications: No apparent anesthesia complications

## 2012-07-09 NOTE — Progress Notes (Signed)
Patient ID: [name] [name], female   DOB: [birth year], 61 y.o.   MRN: [id number]  Filed Vitals:   07/09/12 1815 07/09/12 1830 07/09/12 1845 07/09/12 1900  BP:    133/58  Pulse: 79 81 86 88  Temp: 98.1 F (36.7 C) 98.1 F (36.7 C) 98.2 F (36.8 C) 98.2 F (36.8 C)  TempSrc: Core (Comment) Core (Comment) Core (Comment) Core (Comment)  Resp: 9 21 22 25   Height:      Weight:      SpO2: 100% 100% 100% 98%   Dop 2.5  Milrinone 0.3  Weaning vent. Almost ready for extubation  Urine output ok CT output low  CBC    Component Value Date/Time   WBC 14.4* 07/09/2012 1430   RBC 4.06 07/09/2012 1430   HGB 10.5* 07/09/2012 1433   HCT 31.0* 07/09/2012 1433   PLT 209 07/09/2012 1430   MCV 79.6 07/09/2012 1430   MCH 25.9* 07/09/2012 1430   MCHC 32.5 07/09/2012 1430   RDW 17.3* 07/09/2012 1430   LYMPHSABS 2.5 07/07/2012 0550   MONOABS 1.0 07/07/2012 0550   EOSABS 0.2 07/07/2012 0550   BASOSABS 0.0 07/07/2012 0550    BMET    Component Value Date/Time   NA 144 07/09/2012 1433   K 3.6 07/09/2012 1433   CL 105 07/08/2012 0500   CO2 26 07/08/2012 0500   GLUCOSE 144* 07/09/2012 1433   BUN 13 07/08/2012 0500   CREATININE 1.06 07/08/2012 0500   CALCIUM 8.6 07/08/2012 0500   GFRNONAA 55* 07/08/2012 0500   GFRAA 64* 07/08/2012 0500    A/P:  Stable postop course

## 2012-07-09 NOTE — Anesthesia Postprocedure Evaluation (Signed)
  Anesthesia Post-op Note  Patient: [name] [name]  Procedure(s) Performed: Procedure(s) (LRB): CORONARY ARTERY BYPASS GRAFTING (CABG) (N/A)  Patient Location: SICU  Anesthesia Type: General  Level of Consciousness: sedated and Patient remains intubated per anesthesia plan  Airway and Oxygen Therapy: Patient remains intubated per anesthesia plan and Patient placed on Ventilator (see vital sign flow sheet for setting)  Post-op Pain: none  Post-op Assessment: Post-op Vital signs reviewed, Patient's Cardiovascular Status Stable, Respiratory Function Stable, Patent Airway, No signs of Nausea or vomiting and Pain level controlled  Post-op Vital Signs: stable  Complications: No apparent anesthesia complications

## 2012-07-09 NOTE — Brief Op Note (Signed)
06/29/2012 - 07/09/2012  12:00 PM  PATIENT:  [name] [name]  61 y.o. female  PRE-OPERATIVE DIAGNOSIS:  CAD  POST-OPERATIVE DIAGNOSIS:  Coronary artery disease  PROCEDURE:  Procedure(s): CORONARY ARTERY BYPASS GRAFTING x 3 (LIMA-LAD, SVG-Ramus, SVG-OM), EVH left thigh  SURGEON:  Surgeon(s): Ivin Poot, MD  ASSISTANT: Suzzanne Cloud, PA-C  ANESTHESIA:   general  PATIENT CONDITION:  ICU - intubated and hemodynamically stable.  PRE-OPERATIVE WEIGHT: 126 kg

## 2012-07-09 NOTE — Preoperative (Signed)
Beta Blockers   Reason not to administer Beta Blockers:Not Applicable, last dose [id number] at 0500

## 2012-07-09 NOTE — Anesthesia Preprocedure Evaluation (Addendum)
Anesthesia Evaluation  Patient identified by MRN, date of birth, ID band Patient awake    Reviewed: Allergy & Precautions, H&P , NPO status , Patient's Chart, lab work & pertinent test results  History of Anesthesia Complications (+) AWARENESS UNDER ANESTHESIA  Airway Mallampati: I TM Distance: >3 FB Neck ROM: full    Dental  (+) Edentulous Upper, Edentulous Lower and Dental Advisory Given   Pulmonary  breath sounds clear to auscultation        Cardiovascular hypertension, Pt. on medications + Past MI and + Peripheral Vascular Disease Rhythm:regular Rate:Normal     Neuro/Psych CVA, No Residual Symptoms    GI/Hepatic   Endo/Other  Type 2, Oral Hypoglycemic Agents  Renal/GU      Musculoskeletal   Abdominal   Peds  Hematology   Anesthesia Other Findings   Reproductive/Obstetrics                          Anesthesia Physical Anesthesia Plan  ASA: IV  Anesthesia Plan: General   Post-op Pain Management:    Induction: Intravenous  Airway Management Planned: Oral ETT  Additional Equipment: Arterial line, CVP, PA Cath and TEE  Intra-op Plan:   Post-operative Plan: Post-operative intubation/ventilation  Informed Consent: I have reviewed the patients History and Physical, chart, labs and discussed the procedure including the risks, benefits and alternatives for the proposed anesthesia with the patient or authorized representative who has indicated his/her understanding and acceptance.     Plan Discussed with: CRNA, Anesthesiologist and Surgeon  Anesthesia Plan Comments:         Anesthesia Quick Evaluation

## 2012-07-09 NOTE — Anesthesia Procedure Notes (Signed)
Procedure Name: Intubation Date/Time: 07/09/2012 8:00 AM Performed by: Sampson Si E Pre-anesthesia Checklist: Patient identified, Timeout performed, Emergency Drugs available, Suction available and Patient being monitored Patient Re-evaluated:Patient Re-evaluated prior to inductionOxygen Delivery Method: Circle system utilized Preoxygenation: Pre-oxygenation with 100% oxygen Intubation Type: IV induction Ventilation: Oral airway inserted - appropriate to patient size and Mask ventilation without difficulty Laryngoscope Size: Mac and 4 Grade View: Grade I Tube type: Oral Tube size: 8.0 mm Number of attempts: 1 Airway Equipment and Method: Stylet Placement Confirmation: ETT inserted through vocal cords under direct vision,  breath sounds checked- equal and bilateral and positive ETCO2 Secured at: 22 cm Tube secured with: Tape Dental Injury: Teeth and Oropharynx as per pre-operative assessment

## 2012-07-09 NOTE — Procedures (Signed)
Extubation Procedure Note  Patient Details:   Name: [name] [name] DOB: 1951/08/23 MRN: [id number]   Airway Documentation:  Airway 22 mm (Active)  Secured at (cm) 22 cm 07/09/2012  6:15 PM  Measured From Lips 07/09/2012  6:15 PM  [place] 07/09/2012  6:15 PM  Secured By Rana Snare Tape 07/09/2012  6:15 PM    Evaluation  O2 sats: 0000000 Complications: None Patient did tolerate procedure well. Bilateral Breath Sounds: Diminished  Pt. Able to follow commands. NIF -22 VC 1500  Airway suctioned. No cuff leak noted, MD aware. Pt. Extubated to 4 lpm Darling. Pt. Able to vocalize. Vitals stable.      Catha Brow 07/09/2012, 10:33 PM

## 2012-07-10 ENCOUNTER — Inpatient Hospital Stay (HOSPITAL_COMMUNITY): Payer: Medicaid Other

## 2012-07-10 LAB — MAGNESIUM
Magnesium: 2.5 mg/dL (ref 1.5–2.5)
Magnesium: 2.5 mg/dL (ref 1.5–2.5)

## 2012-07-10 LAB — GLUCOSE, CAPILLARY
Glucose-Capillary: 101 mg/dL — ABNORMAL HIGH (ref 70–99)
Glucose-Capillary: 128 mg/dL — ABNORMAL HIGH (ref 70–99)
Glucose-Capillary: 147 mg/dL — ABNORMAL HIGH (ref 70–99)
Glucose-Capillary: 140 mg/dL — ABNORMAL HIGH (ref 70–99)
Glucose-Capillary: 94 mg/dL (ref 70–99)
Glucose-Capillary: 209 mg/dL — ABNORMAL HIGH (ref 70–99)

## 2012-07-10 LAB — CBC
MCHC: 32 g/dL (ref 30.0–36.0)
Platelets: 220 10*3/uL (ref 150–400)
RBC: 3.82 MIL/uL — ABNORMAL LOW (ref 3.87–5.11)
RDW: 17.7 % — ABNORMAL HIGH (ref 11.5–15.5)
RDW: 17.8 % — ABNORMAL HIGH (ref 11.5–15.5)
WBC: 16.2 10*3/uL — ABNORMAL HIGH (ref 4.0–10.5)

## 2012-07-10 LAB — POCT I-STAT, CHEM 8
Calcium, Ion: 1.16 mmol/L (ref 1.13–1.30)
Chloride: 105 mEq/L (ref 96–112)
Creatinine, Ser: 0.9 mg/dL (ref 0.50–1.10)
Glucose, Bld: 183 mg/dL — ABNORMAL HIGH (ref 70–99)
Potassium: 4.1 mEq/L (ref 3.5–5.1)

## 2012-07-10 LAB — BASIC METABOLIC PANEL
Chloride: 110 mEq/L (ref 96–112)
GFR calc Af Amer: >90 mL/min (ref >90)
Potassium: 3.9 mEq/L (ref 3.5–5.1)

## 2012-07-10 LAB — CREATININE, SERUM
Creatinine, Ser: 0.89 mg/dL (ref 0.50–1.10)
GFR calc non Af Amer: 69 mL/min — ABNORMAL LOW (ref >90)

## 2012-07-10 MED ORDER — ENOXAPARIN SODIUM 40 MG/0.4ML ~~LOC~~ SOLN
40.0000 mg | SUBCUTANEOUS | Status: DC
  Administered 2012-07-10 – 2012-07-14 (×5): 40 mg via SUBCUTANEOUS
  Filled 2012-07-10 (×6): qty 0.4

## 2012-07-10 MED ORDER — POTASSIUM CHLORIDE CRYS ER 20 MEQ PO TBCR
40.0000 meq | EXTENDED_RELEASE_TABLET | Freq: Once | ORAL | Status: AC
  Administered 2012-07-10: 40 meq via ORAL
  Filled 2012-07-10: qty 2

## 2012-07-10 MED ORDER — FUROSEMIDE 10 MG/ML IJ SOLN
40.0000 mg | Freq: Once | INTRAMUSCULAR | Status: AC
  Administered 2012-07-10: 40 mg via INTRAVENOUS
  Filled 2012-07-10: qty 4

## 2012-07-10 MED ORDER — INSULIN ASPART 100 UNIT/ML ~~LOC~~ SOLN
0.0000 [IU] | SUBCUTANEOUS | Status: DC
  Administered 2012-07-10: 8 [IU] via SUBCUTANEOUS
  Administered 2012-07-10: 2 [IU] via SUBCUTANEOUS
  Administered 2012-07-10: 4 [IU] via SUBCUTANEOUS
  Administered 2012-07-11: 8 [IU] via SUBCUTANEOUS
  Administered 2012-07-11: 2 [IU] via SUBCUTANEOUS
  Administered 2012-07-11: 8 [IU] via SUBCUTANEOUS
  Administered 2012-07-11: 2 [IU] via SUBCUTANEOUS
  Administered 2012-07-12: 4 [IU] via SUBCUTANEOUS
  Administered 2012-07-12: 2 [IU] via SUBCUTANEOUS

## 2012-07-10 MED ORDER — INSULIN GLARGINE 100 UNIT/ML ~~LOC~~ SOLN
30.0000 [IU] | Freq: Every day | SUBCUTANEOUS | Status: DC
  Administered 2012-07-11 – 2012-07-13 (×3): 30 [IU] via SUBCUTANEOUS

## 2012-07-10 NOTE — Progress Notes (Signed)
Patient ID: [name] [name], female   DOB: [birth year], 61 y.o.   MRN: [id number]  Filed Vitals:   07/10/12 1800 07/10/12 1900 07/10/12 1944 07/10/12 1951  BP: 92/56 108/56    Pulse: 85 83    Temp:   98.6 F (37 C)   TempSrc:   Oral   Resp: 25 29    Height:      Weight:      SpO2: 97% 95%  95%   Hemodynamics stable off dopamine. Diuresing  Stable day.

## 2012-07-10 NOTE — Progress Notes (Signed)
1 Day Post-Op Procedure(s) (LRB): CORONARY ARTERY BYPASS GRAFTING (CABG) (N/A) Subjective: sore  Objective: Vital signs in last 24 hours: Temp:  [96.8 F (36 C)-100 F (37.8 C)] 99.7 F (37.6 C) (07/13 0900) Pulse Rate:  [79-103] 95  (07/13 0900) Cardiac Rhythm:  [-] Normal sinus rhythm (07/13 0800) Resp:  [0-31] 23  (07/13 0900) BP: (95-133)/(45-64) 108/45 mmHg (07/13 0900) SpO2:  [94 %-100 %] 97 % (07/13 0900) Arterial Line BP: (103-138)/(52-68) 114/52 mmHg (07/13 0900) FiO2 (%):  [40 %-51.6 %] 40 % (07/12 2050) Weight:  [130.4 kg (287 lb 7.7 oz)] 130.4 kg (287 lb 7.7 oz) (07/13 0400)  Hemodynamic parameters for last 24 hours: PAP: (25-36)/(8-21) 29/12 mmHg CO:  [2.2 L/min-7 L/min] 7 L/min CI:  [1.9 L/min/m2-6.6 L/min/m2] 3 L/min/m2  Intake/Output from previous day: 07/12 0701 - 07/13 0700 In: 5172.5 [I.V.:4005.5; Blood:387; IV Piggyback:780] Out: [id number]; Drains:165; Blood:600; Chest Tube:130] Intake/Output this shift: Total I/O In: 60.1 [I.V.:60.1] Out: 100 [Urine:100]  General appearance: alert and cooperative Neurologic: intact Heart: regular rate and rhythm Lungs: clear to auscultation bilaterally Extremities: edema mild Wound: dressings dry  Lab Results:  Basename 07/10/12 0359 07/09/12 2041 07/09/12 2030  WBC 16.2* -- 15.7*  HGB 10.2* 10.5* --  HCT 30.2* 31.0* --  PLT 220 -- 217   BMET:  Basename 07/10/12 0359 07/09/12 2041 07/08/12 0500  NA 142 142 --  K 3.9 4.3 --  CL 110 108 --  CO2 24 -- 26  GLUCOSE 117* 151* --  BUN 12 10 --  CREATININE 0.80 0.90 --  CALCIUM 8.5 -- 8.6    PT/INR:  Basename 07/09/12 1430  LABPROT 16.8*  INR 1.34   ABG    Component Value Date/Time   PHART 7.406 07/09/2012 2154   HCO3 23.5 07/09/2012 2154   TCO2 25 07/09/2012 2154   ACIDBASEDEF 1.0 07/09/2012 2154   O2SAT 94.0 07/09/2012 2154   CBG (last 3)   Basename 07/10/12 0708 07/10/12 0001 07/09/12 2249  GLUCAP 94 118* 140*   CXR:  Mild edema  ECG:   NSR, anterolateral T wave abnormality present preop  Assessment/Plan: S/P Procedure(s) (LRB): CORONARY ARTERY BYPASS GRAFTING (CABG) (N/A) Mobilize Diuresis Diabetes control d/c tubes/lines Continue foley due to patient in ICU and urinary output monitoring See progression orders   LOS: 11 days    [name] [name] K 07/10/2012

## 2012-07-10 NOTE — Op Note (Signed)
[name] [name], [name] [name] NO.:  [id number]  MEDICAL RECORD NO.:  [id number]  LOCATION:  2309                         FACILITY:  [facility]  PHYSICIAN:  Ivin Poot, M.D.  DATE OF BIRTH:  1951/02/27  DATE OF PROCEDURE:  07/09/2012 DATE OF DISCHARGE:                              OPERATIVE REPORT   OPERATION: 1. Coronary artery bypass grafting x3 (left internal mammary artery to     left anterior descending artery, saphenous vein graft to obtuse     marginal 1, saphenous vein graft to distal circumflex). 2. Endoscopic harvest of right and left leg greater saphenous vein.  SURGEON:  Ivin Poot, MD  ASSISTANT:  Suzzanne Cloud, PA-C  ANESTHESIA:  General.  INDICATIONS:  The patient is a 61 year old diabetic smoker with an acute MI 5 days previously when she presented with acute chest pain, positive cardiac enzymes.  Her echo showed akinesia of the anterior apical LV. Cardiac catheterization by Dr. Peter Martinique demonstrated high-grade LAD and circumflex disease with a nondominant small right coronary artery. EF was 25-30%.  Surgical revascularization was recommended.  She had small diabetic vessel disease and the LAD had significant diffuse distal disease.  I examined the patient and reviewed the results of the cardiac catheterization and discussed coronary artery bypass grafting for treatment of the coronary artery disease.  I discussed the details of surgery including the location of the surgical incisions, the use of general anesthesia and cardiopulmonary bypass, and expected postoperative hospital recovery.  I discussed with her the risks of the operation including the risks of death, stroke, bleeding, MI, infection, ventilator dependence, and pneumonia.  She demonstrated her understanding and agreed to proceed with the surgery under what I felt was an informed consent.  PROCEDURE:  The patient was brought to the operating room and placed in supine on the  operating room table.  General anesthesia was induced. The chest, abdomen, and legs were prepped with Betadine and draped as a sterile field.  A sternal incision was made.  The saphenous vein was harvested from the lower extremities.  The right leg greater saphenous vein was dissected, but was too small to use.  The left greater saphenous vein was harvested and used.  The mammary artery was a small, but adequate vessel with good flow.  Sternal retractor was placed, and the pericardium was opened and suspended.  Pursestrings were placed in the ascending aorta and right atrium and the patient was heparinized. The patient was cannulated and placed on cardiopulmonary bypass.  The coronary arteries were identified for grafting and the mammary artery and vein grafts were prepared for the distal anastomoses.  Cardioplegic cannulas were placed for both antegrade and retrograde cold blood cardioplegia.  The patient was cooled to 32 degrees and the aortic crossclamp was applied.  An 800 mL of cold blood cardioplegia was delivered in split doses between the antegrade aortic and retrograde coronary sinus catheters.  There was good cardioplegic arrest.  Prior to going on bypass, the patient had a transesophageal echo, which showed global LV dysfunction, but no significant MR or AI.  The distal coronary anastomoses were then performed.  The first distal anastomosis  was to the distal circumflex.  This was a 1.5-mm vessel with proximal 80% stenosis.  A reverse saphenous vein was sewn end-to-side with running 7-0 Prolene with excellent flow through the graft.  The second distal anastomosis was to the OM1.  This was a vessel that bifurcated and the graft was placed in the larger of the two branches. It had a proximal 80% stenosis.  A reverse saphenous vein was sewn end- to-side with running 7-0 Prolene with good flow through the graft. Cardioplegia was redosed.  The third distal anastomosis was to the  mid- to-distal third of the LAD.  There was diffuse disease.  The mammary arteries were brought through the left pericardium and sewn end-to-side with running 8-0 Prolene.  There was excellent flow through the anastomosis.  A 1-mm probe would go distally and 1.5-mm probe would pass proximally.  The mammary was re-occluded with a vascular pedicle clamp and the pedicle was secured to the epicardium with 6-0 Prolene. Cardioplegia was redosed.  While the crossclamp was still in place, two proximal vein anastomoses were placed on the ascending aorta using a 4.0-mm punch.  Air was vented from the coronary arteries with a dose of retrograde warm blood cardioplegia, and the crossclamp was removed.  The heart was cardioverted back to a regular rhythm.  The grafts were checked and found to be with good flow and hemostasis was documented with proximal and distal sites.  Temporary pacing wires were applied, and the patient was rewarmed.  The lungs were re-expanded.  The ventilator was resumed.  The patient was weaned off bypass without difficulty.  Hemodynamics and vital signs were stable.  Echo showed improved global LV function.  Cardiac output was 4-5 liters.  Protamine was administered without adverse reaction.  The cannulas were removed. The mediastinum was irrigated with warm saline.  The superior pericardial fat was closed.  Two chest tubes were placed in the anterior mediastinum and then to the left pleural space.  The sternum was closed with wire.  The pectoralis fascia was closed with running Vicryl.  The subcutaneous and skin layers were closed in running Vicryl, and sterile dressings were applied.  Total cardiopulmonary bypass time was 110 minutes.     Ivin Poot, M.D.     PV/MEDQ  D:  07/09/2012  T:  07/10/2012  Job:  [id number]  cc:   Peter M. Martinique, M.D.

## 2012-07-11 ENCOUNTER — Inpatient Hospital Stay (HOSPITAL_COMMUNITY): Payer: Medicaid Other

## 2012-07-11 LAB — GLUCOSE, CAPILLARY
Glucose-Capillary: 120 mg/dL — ABNORMAL HIGH (ref 70–99)
Glucose-Capillary: 108 mg/dL — ABNORMAL HIGH (ref 70–99)
Glucose-Capillary: 117 mg/dL — ABNORMAL HIGH (ref 70–99)
Glucose-Capillary: 86 mg/dL (ref 70–99)
Glucose-Capillary: 113 mg/dL — ABNORMAL HIGH (ref 70–99)
Glucose-Capillary: 230 mg/dL — ABNORMAL HIGH (ref 70–99)

## 2012-07-11 LAB — CBC
HCT: 26.5 % — ABNORMAL LOW (ref 36.0–46.0)
MCH: 26.2 pg (ref 26.0–34.0)
MCHC: 32.8 g/dL (ref 30.0–36.0)
MCV: 79.8 fL (ref 78.0–100.0)
RDW: 18.1 % — ABNORMAL HIGH (ref 11.5–15.5)

## 2012-07-11 LAB — TYPE AND SCREEN
ABO/RH(D): O POS
Antibody Screen: NEGATIVE
Unit division: 0
Unit division: 0

## 2012-07-11 LAB — BASIC METABOLIC PANEL
BUN: 16 mg/dL (ref 6–23)
CO2: 26 mEq/L (ref 19–32)
Chloride: 103 mEq/L (ref 96–112)
Creatinine, Ser: 0.97 mg/dL (ref 0.50–1.10)

## 2012-07-11 MED ORDER — MORPHINE SULFATE 2 MG/ML IJ SOLN
INTRAMUSCULAR | Status: AC
  Filled 2012-07-11: qty 1

## 2012-07-11 MED ORDER — POTASSIUM CHLORIDE CRYS ER 20 MEQ PO TBCR
40.0000 meq | EXTENDED_RELEASE_TABLET | Freq: Once | ORAL | Status: AC
  Administered 2012-07-11: 40 meq via ORAL
  Filled 2012-07-11: qty 2

## 2012-07-11 MED ORDER — FUROSEMIDE 10 MG/ML IJ SOLN
40.0000 mg | Freq: Once | INTRAMUSCULAR | Status: AC
  Administered 2012-07-11: 40 mg via INTRAVENOUS
  Filled 2012-07-11: qty 4

## 2012-07-11 MED ORDER — MORPHINE SULFATE 2 MG/ML IJ SOLN
2.0000 mg | Freq: Once | INTRAMUSCULAR | Status: AC
  Administered 2012-07-11: 2 mg via INTRAVENOUS

## 2012-07-11 NOTE — Progress Notes (Signed)
2 Days Post-Op Procedure(s) (LRB): CORONARY ARTERY BYPASS GRAFTING (CABG) (N/A) Subjective: No complaints  Objective: Vital signs in last 24 hours: Temp:  [98.6 F (37 C)-100.2 F (37.9 C)] 99.7 F (37.6 C) (07/14 0400) Pulse Rate:  [80-95] 83  (07/14 0900) Cardiac Rhythm:  [-] Normal sinus rhythm (07/14 0800) Resp:  [0-30] 28  (07/14 0900) BP: (92-122)/(45-79) 109/64 mmHg (07/14 0900) SpO2:  [90 %-99 %] 96 % (07/14 0900) Arterial Line BP: (117-137)/(51-58) 137/58 mmHg (07/13 1300) Weight:  [130.5 kg (287 lb 11.2 oz)] 130.5 kg (287 lb 11.2 oz) (07/14 0600)  Hemodynamic parameters for last 24 hours: PAP: (30-33)/(12-17) 33/16 mmHg CO:  [6.1 L/min-6.5 L/min] 6.1 L/min CI:  [2.6 L/min/m2-2.8 L/min/m2] 2.6 L/min/m2  Intake/Output from previous day: 07/13 0701 - 07/14 0700 In: 1544.9 [P.O.:360; I.V.:684.9; IV Piggyback:500] Out: 1975 [Urine:1975] Intake/Output this shift: Total I/O In: 180 [P.O.:120; I.V.:60] Out: 125 [Urine:125]  General appearance: alert and cooperative Heart: regular rate and rhythm, S1, S2 normal, no murmur, click, rub or gallop Lungs: clear to auscultation bilaterally Extremities: extremities normal, atraumatic, no cyanosis or edema Wound: incision ok  Lab Results:  Basename 07/11/12 0442 07/10/12 1638 07/10/12 1630  WBC 14.6* -- 15.7*  HGB 8.7* 8.8* --  HCT 26.5* 26.0* --  PLT 189 -- 199   BMET:  Basename 07/11/12 0442 07/10/12 1638 07/10/12 0359  NA 137 140 --  K 4.2 4.1 --  CL 103 105 --  CO2 26 -- 24  GLUCOSE 124* 183* --  BUN 16 12 --  CREATININE 0.97 0.90 --  CALCIUM 8.3* -- 8.5    PT/INR:  Basename 07/09/12 1430  LABPROT 16.8*  INR 1.34   ABG    Component Value Date/Time   PHART 7.406 07/09/2012 2154   HCO3 23.5 07/09/2012 2154   TCO2 22 07/10/2012 1638   ACIDBASEDEF 1.0 07/09/2012 2154   O2SAT 94.0 07/09/2012 2154   CBG (last 3)   Basename 07/11/12 0735 07/11/12 0408 07/11/12 0013  GLUCAP 130* 113* 111*     Assessment/Plan: S/P Procedure(s) (LRB): CORONARY ARTERY BYPASS GRAFTING (CABG) (N/A) Mobilize Diuresis Diabetes control   LOS: 12 days    [name] [name] K 07/11/2012

## 2012-07-12 ENCOUNTER — Encounter (HOSPITAL_COMMUNITY): Payer: Self-pay | Admitting: Cardiothoracic Surgery

## 2012-07-12 LAB — CBC
HCT: 27.9 % — ABNORMAL LOW (ref 36.0–46.0)
MCH: 25.6 pg — ABNORMAL LOW (ref 26.0–34.0)
MCHC: 32.3 g/dL (ref 30.0–36.0)
RDW: 18.2 % — ABNORMAL HIGH (ref 11.5–15.5)

## 2012-07-12 LAB — GLUCOSE, CAPILLARY
Glucose-Capillary: 105 mg/dL — ABNORMAL HIGH (ref 70–99)
Glucose-Capillary: 108 mg/dL — ABNORMAL HIGH (ref 70–99)
Glucose-Capillary: 108 mg/dL — ABNORMAL HIGH (ref 70–99)
Glucose-Capillary: 143 mg/dL — ABNORMAL HIGH (ref 70–99)
Glucose-Capillary: 184 mg/dL — ABNORMAL HIGH (ref 70–99)

## 2012-07-12 LAB — BASIC METABOLIC PANEL
BUN: 19 mg/dL (ref 6–23)
Calcium: 8.8 mg/dL (ref 8.4–10.5)
GFR calc Af Amer: 69 mL/min — ABNORMAL LOW (ref >90)
GFR calc non Af Amer: 60 mL/min — ABNORMAL LOW (ref >90)
Potassium: 4.4 mEq/L (ref 3.5–5.1)

## 2012-07-12 MED ORDER — SODIUM CHLORIDE 0.9 % IJ SOLN
3.0000 mL | Freq: Two times a day (BID) | INTRAMUSCULAR | Status: DC
  Administered 2012-07-12 – 2012-07-14 (×5): 3 mL via INTRAVENOUS

## 2012-07-12 MED ORDER — GLIMEPIRIDE 2 MG PO TABS
2.0000 mg | ORAL_TABLET | Freq: Every day | ORAL | Status: DC
  Administered 2012-07-12 – 2012-07-13 (×2): 2 mg via ORAL
  Filled 2012-07-12 (×3): qty 1

## 2012-07-12 MED ORDER — ALBUTEROL SULFATE HFA 108 (90 BASE) MCG/ACT IN AERS
2.0000 | INHALATION_SPRAY | RESPIRATORY_TRACT | Status: DC | PRN
  Filled 2012-07-12: qty 6.7

## 2012-07-12 MED ORDER — SODIUM CHLORIDE 0.9 % IV SOLN: 250.0000 mL | INTRAVENOUS | Status: DC | PRN

## 2012-07-12 MED ORDER — INSULIN ASPART 100 UNIT/ML ~~LOC~~ SOLN
0.0000 [IU] | Freq: Three times a day (TID) | SUBCUTANEOUS | Status: DC
  Administered 2012-07-12: 4 [IU] via SUBCUTANEOUS
  Administered 2012-07-13 – 2012-07-15 (×6): 2 [IU] via SUBCUTANEOUS

## 2012-07-12 MED ORDER — POTASSIUM CHLORIDE CRYS ER 20 MEQ PO TBCR
20.0000 meq | EXTENDED_RELEASE_TABLET | Freq: Every day | ORAL | Status: DC
  Administered 2012-07-12 – 2012-07-15 (×4): 20 meq via ORAL
  Filled 2012-07-12 (×4): qty 1

## 2012-07-12 MED ORDER — METFORMIN HCL 500 MG PO TABS
1000.0000 mg | ORAL_TABLET | Freq: Two times a day (BID) | ORAL | Status: DC
  Administered 2012-07-12 – 2012-07-15 (×6): 1000 mg via ORAL
  Filled 2012-07-12 (×9): qty 2

## 2012-07-12 MED ORDER — SODIUM CHLORIDE 0.9 % IJ SOLN: 3.0000 mL | INTRAMUSCULAR | Status: DC | PRN

## 2012-07-12 MED ORDER — FUROSEMIDE 40 MG PO TABS
40.0000 mg | ORAL_TABLET | Freq: Every day | ORAL | Status: DC
  Administered 2012-07-12 – 2012-07-15 (×4): 40 mg via ORAL
  Filled 2012-07-12 (×4): qty 1

## 2012-07-12 MED ORDER — MOVING RIGHT ALONG BOOK
Freq: Once | Status: AC
  Administered 2012-07-12: 17:00:00
  Filled 2012-07-12: qty 1

## 2012-07-12 NOTE — Progress Notes (Signed)
    Pt is doing well.  No cardiac issues at this point. Will probably transfer from 2300 to 2000 today.   Thayer Headings, Brooke Bonito., MD, [facility] 07/12/2012, 8:41 AM Office - [phone] Pager [phone]

## 2012-07-12 NOTE — Progress Notes (Addendum)
[address]            [place] [address]          [phone]     3 Days Post-Op Procedure(s) (LRB): CORONARY ARTERY BYPASS GRAFTING (CABG) (N/A)  Subjective: "I feel like a million dollars."  Feels well, no complaints.  Walked in hall earlier.    Objective: Vital signs in last 24 hours: Patient Vitals for the past 24 hrs:  BP Temp Temp src Pulse Resp SpO2 Weight  07/12/12 0752 - 99.4 F (37.4 C) Oral - - - -  07/12/12 0700 113/56 mmHg - - 78  25  96 % -  07/12/12 0600 108/59 mmHg - - 81  30  97 % 285 lb 15 oz (129.7 kg)  07/12/12 0500 104/57 mmHg - - 77  21  96 % -  07/12/12 0400 106/55 mmHg 98.8 F (37.1 C) Oral 77  23  96 % -  07/12/12 0300 97/54 mmHg - - 77  22  96 % -  07/12/12 0200 104/46 mmHg - - 76  21  96 % -  07/12/12 0134 102/64 mmHg - - 77  24  95 % -  07/12/12 0100 102/64 mmHg - - 74  19  96 % -  07/12/12 0000 106/54 mmHg 98.1 F (36.7 C) Oral 74  24  97 % -  07/11/12 2300 102/53 mmHg - - 72  22  97 % -  07/11/12 2200 82/45 mmHg - - 73  17  98 % -  07/11/12 2102 101/62 mmHg - - 72  25  98 % -  07/11/12 2100 101/62 mmHg - - 72  26  97 % -  07/11/12 2000 97/55 mmHg 98.9 F (37.2 C) Oral 73  19  97 % -  07/11/12 1900 109/51 mmHg - - 76  24  97 % -  07/11/12 1800 105/55 mmHg - - - 25  92 % -  07/11/12 1700 112/82 mmHg - - 79  30  94 % -  07/11/12 1600 105/53 mmHg - - 76  23  97 % -  07/11/12 1536 - 99.5 F (37.5 C) Oral - - - -  07/11/12 1515 - - - - - 98 % -  07/11/12 1500 104/56 mmHg - - 74  22  98 % -  07/11/12 1400 110/52 mmHg - - - 29  - -  07/11/12 1300 111/58 mmHg - - - 28  - -  07/11/12 1237 - 99.1 F (37.3 C) Oral - 33  - -  07/11/12 1200 116/60 mmHg - - - 27  - -  07/11/12 1100 113/55 mmHg - - 82  20  96 % -  07/11/12 1000 115/63 mmHg - - 82  26  97 % -  07/11/12 0900 109/64 mmHg - - 83  28  96 % -  07/11/12 0850 - - - - - 96 % -   Current Weight  07/12/12 285 lb 15 oz (129.7 kg)   PRE-OPERATIVE  WEIGHT: 126 kg   Intake/Output from previous day: 07/14 0701 - 07/15 0700 In: 840 [P.O.:360; I.V.:80; IV Piggyback:400] Out: 2225 [Urine:2225]  CBGs 230-235-184-108-134  PHYSICAL EXAM:  Heart: RRR Lungs:slightly diminished BS in bases Wound: clean and dry Extremities: mild LE edema    Lab Results: CBC: Basename 07/12/12 0645 07/11/12 0442  WBC 12.9* 14.6*  HGB 9.0* 8.7*  HCT 27.9* 26.5*  PLT 232 189   BMET:  Basename 07/12/12 0645 07/11/12 0442  NA 137 137  K 4.4 4.2  CL 102 103  CO2 25 26  GLUCOSE 134* 124*  BUN 19 16  CREATININE 1.00 0.97  CALCIUM 8.8 8.3*    PT/INR:  Basename 07/09/12 1430  LABPROT 16.8*  INR 1.34      Assessment/Plan: S/P Procedure(s) (LRB): CORONARY ARTERY BYPASS GRAFTING (CABG) (N/A)  CV- stable, SR.  BPs low normal.  Continue current meds.  Vol overload- continue diuresis.  DM- sugars remain elevated.  Pre-op A1C=10.5.  Will resume po metformin and continue Lantus. May need further titration.  Continue ambulation, pulm toilet.   ? tx PTCU   LOS: 13 days    COLLINS,GINA H 07/12/2012    Chart reviewed, patient examined, agree with above. Transfer to 2000 She needs another oral agent. Will add amaryl.

## 2012-07-12 NOTE — Progress Notes (Signed)
Clinical Social Work-CSw received phone call from sister and visited with pt and sister at bed side-while pt desires to go home sister is very clear that she has many concerns including a lack of 24 hour care-CSW spoke with pt and sister [phone] making/24 hour care/SNF and HH- CSW encouraged pt and sister to continue to discuss disposition options in the event that pt not able to arrange care- CSW will initiate bed search as a back up to d/c home- Gerre Scull, [phone]

## 2012-07-13 ENCOUNTER — Inpatient Hospital Stay (HOSPITAL_COMMUNITY): Payer: Medicaid Other

## 2012-07-13 LAB — BASIC METABOLIC PANEL
BUN: 23 mg/dL (ref 6–23)
Calcium: 8.7 mg/dL (ref 8.4–10.5)
Creatinine, Ser: 1.06 mg/dL (ref 0.50–1.10)
GFR calc Af Amer: 64 mL/min — ABNORMAL LOW (ref >90)
GFR calc non Af Amer: 55 mL/min — ABNORMAL LOW (ref >90)

## 2012-07-13 LAB — CBC
HCT: 26.2 % — ABNORMAL LOW (ref 36.0–46.0)
MCH: 25.6 pg — ABNORMAL LOW (ref 26.0–34.0)
MCHC: 32.1 g/dL (ref 30.0–36.0)
MCV: 79.9 fL (ref 78.0–100.0)
Platelets: 270 10*3/uL (ref 150–400)
RDW: 18.2 % — ABNORMAL HIGH (ref 11.5–15.5)

## 2012-07-13 LAB — GLUCOSE, CAPILLARY
Glucose-Capillary: 123 mg/dL — ABNORMAL HIGH (ref 70–99)
Glucose-Capillary: 122 mg/dL — ABNORMAL HIGH (ref 70–99)

## 2012-07-13 MED ORDER — POTASSIUM CHLORIDE CRYS ER 20 MEQ PO TBCR: 20.0000 meq | EXTENDED_RELEASE_TABLET | Freq: Every day | ORAL | Status: DC

## 2012-07-13 MED ORDER — FUROSEMIDE 40 MG PO TABS: 40.0000 mg | ORAL_TABLET | Freq: Every day | ORAL | Status: DC

## 2012-07-13 MED ORDER — GLIMEPIRIDE 4 MG PO TABS: 4.0000 mg | ORAL_TABLET | Freq: Every day | ORAL | Status: DC

## 2012-07-13 MED ORDER — POLYSACCHARIDE IRON COMPLEX 150 MG PO CAPS: 150.0000 mg | ORAL_CAPSULE | Freq: Every day | ORAL | Status: DC

## 2012-07-13 MED ORDER — METOPROLOL TARTRATE 25 MG PO TABS: 12.5000 mg | ORAL_TABLET | Freq: Two times a day (BID) | ORAL | Status: DC

## 2012-07-13 MED ORDER — TRAMADOL HCL 50 MG PO TABS: 50.0000 mg | ORAL_TABLET | ORAL | Status: DC | PRN

## 2012-07-13 MED ORDER — FLUTICASONE-SALMETEROL 250-50 MCG/DOSE IN AEPB: 1.0000 | INHALATION_SPRAY | Freq: Two times a day (BID) | RESPIRATORY_TRACT | Status: DC

## 2012-07-13 MED ORDER — LISINOPRIL 5 MG PO TABS: 5.0000 mg | ORAL_TABLET | Freq: Every day | ORAL | Status: DC

## 2012-07-13 MED ORDER — GLIMEPIRIDE 4 MG PO TABS
4.0000 mg | ORAL_TABLET | Freq: Every day | ORAL | Status: DC
  Administered 2012-07-14 – 2012-07-15 (×2): 4 mg via ORAL
  Filled 2012-07-13 (×3): qty 1

## 2012-07-13 MED ORDER — POLYSACCHARIDE IRON COMPLEX 150 MG PO CAPS
150.0000 mg | ORAL_CAPSULE | Freq: Every day | ORAL | Status: DC
  Administered 2012-07-13 – 2012-07-15 (×3): 150 mg via ORAL
  Filled 2012-07-13 (×3): qty 1

## 2012-07-13 MED ORDER — LISINOPRIL 5 MG PO TABS
5.0000 mg | ORAL_TABLET | Freq: Every day | ORAL | Status: DC
  Administered 2012-07-13 – 2012-07-15 (×3): 5 mg via ORAL
  Filled 2012-07-13 (×4): qty 1

## 2012-07-13 MED FILL — Sodium Chloride Irrigation Soln 0.9%: Qty: 3000 | Status: AC

## 2012-07-13 MED FILL — Mannitol IV Soln 20%: INTRAVENOUS | Qty: 500 | Status: AC

## 2012-07-13 MED FILL — Sodium Chloride IV Soln 0.9%: INTRAVENOUS | Qty: 1000 | Status: AC

## 2012-07-13 MED FILL — Electrolyte-R (PH 7.4) Solution: INTRAVENOUS | Qty: 4000 | Status: AC

## 2012-07-13 NOTE — Discharge Summary (Signed)
Physician Discharge Summary  Patient ID: [name] [name] MRN: [id number] DOB/AGE: 1951-08-31 61 y.o.  Admit date: 06/29/2012 Discharge date: 07/15/2012  Admission Diagnoses: 1.S/p NSTEMI 2.Multivessel CAD 3.History of hypertension 4.History of DM 5.History of hyperlipidemia 6.History of CVA 7.History of tobacco abuse 8.History of cocaine abuse 9.Diabetic ketoacidosis  Discharge Diagnoses:  1.S/p NSTEMI 2.Multivessel CAD 3.History of hypertension 4.History of DM 5.History of hyperlipidemia 6.History of CVA 7.History of tobacco abuse 8.History of cocaine abuse 9.Diabetic ketoacidosis 10.ABL anemia  Procedure (s):  1.Cardiac catheterization done by Dr. Martinique on 06/29/2012: Left mainstem: The left main coronary has mild irregularities less than 10%.  Left anterior descending (LAD): There is an 80% focal stenosis in the proximal LAD. There is moderate disease immediately after the takeoff of the first septal perforator at 50-70%. In the mid LAD in a tortuous segment there is a segmental 80% stenosis. The distal LAD is small in caliber and appears to be diffusely diseased. Left circumflex (LCx): The left circumflex is a dominant vessel. The first obtuse marginal vessel bifurcates into 2 moderate-sized branches. These branches are diffusely diseased up to 30%. In the mid circumflex there is a focal 80% ulcerated stenosis. The distal circumflex is without significant disease. Right coronary artery (RCA): The right coronary is a nondominant vessel. It has 40-50% diffuse disease in the proximal vessel. Left ventriculography: Left ventricular systolic function is markedly abnormal. There is anterior lateral and apical akinesis. The inferior wall is severely hypokinetic. Overall ejection fraction is estimated at 25%.   2.Coronary artery bypass grafting x3 (left internal mammary artery to  left anterior descending artery, saphenous vein graft to obtuse marginal 1, saphenous vein graft to distal  circumflex) with endoscopic harvest of right and left leg greater saphenous vein by Dr. Prescott Gum on 07/09/2012  History of Presenting Illness: This is a 61 y.o. African American female with a past medical history  significant for tobacco and cocaine abuse, HLD, HTN, DM, and CVA who presented to [facility] on 06/29/2012 with complaints of chest pain in the setting of cocaine use. She had no prior cardiac history. An echo done in 2008 revealed nl LV systolic function, EF [id number]. She reports being a "hard crack addict" until 2008, but has occasionally done cocaine since that time. On 06/28/2012, she did cocaine throughout the day starting around 5pm and developed chest pain around 8:30pm. It was left sided, "moved all over" her chest, described as a tightness, was a 10/10 on the pain scale, associated with nausea, vomiting, sob, and diaphoresis. She called EMS who administered ASA and 2 SL NTG with moderate relief. A 12 lead EKG from EMS showed ST elevation V1-2, inferolateral TWI. She was given Ativan 1mg  x2 in the ED. She was  very somnolent when evaluated by cardiology, but reports a 4/10 chest pain. She reported being "very active" and has never had chest pain before. She has not been taking all of her medications over the last week. She denied recent illness, fever, chills, sob, doe, edema, orthopnea, syncope. EKG in the ER showed sinus tachycardia at 118bpm, lateral deep TWI, 71mm ST elevation V1, <41mm ST elevation V2. Her initial troponin was 17.12. She ruled in for a NSTEMI. Other pertinent laboratory results were K+ 5.2, Cr 1.12, WBC 17.9, and glucose 771.       She then underwent a cardiac catheterization by Dr. Martinique on 06/29/2012. She was found to have multivessel CAD. A cardiothoracic consultation was obtained with Dr. Prescott Gum for the  consideration of coronary artery bypass grafting surgery. Potential risks, complications, and benefits were discussed with the patient and she agreed to proceed with  surgery. She was placed on a heparin gttp. Pre operative duplex carotid US showed no significant stenosis bilaterally and ABI's were 0.73 on the right and 0.71 on the left. Once she had several days to recover from her MI as well as have better diabetes control, she underwent a CABG x 3 on 07/09/2012.  Brief Hospital Course:  She was extubated without difficulty the evening of surgery.She remained afebrile and hemodynamically stable. Her Gordy Councilman, a line, chest tubes, and foley were all removed  Early in his post operative course stay. She was weaned off of Dopamine. She was started on low dose Lopressor. She was volume overloaded and diuresed accordingly. She had a history of DM and her HGA1C pre op was 10.5. She was weaned off of her Insulin drip. She was restarted on Metformin. She then was started on Amaryl. She will need follow up with her medical doctor upon discharge to determine whether or not she will also require insulin.She had ABL anemia. Her H and H went as low as 8.4 and 26.2. She was started on Nu Iron. She was felt surgically stable for transfer from the ICU to PCTU for further convalescence on 07/12/2012. She has been tolerating a diet and has had a bowel movement. She is ambulating well on room air. Her epicardial pacing wires and chest tube sutures will be removed prior to discharge. Provided she remains afebrile, hemodynamically stable, and pending morning round evaluation, she will be surgically stable for discharge in the morning.   Latest Vital Signs: Blood pressure 125/77, pulse 76, temperature 99 F (37.2 C), temperature source Oral, resp. rate 20, height 5\' 8"  (1.727 m), weight 268 lb 1.3 oz (121.6 kg), SpO2 94.00%.  Physical Exam: Cardiovascular: RRR, no murmurs, gallops, or rubs.  Pulmonary: Diminished at bases; no rales, wheezes, or rhonchi.  Abdomen: Soft, non tender, bowel sounds present.  Extremities: Mild bilateral lower extremity edema.  Wounds: Clean and dry. No  erythema or signs of infection.  Discharge Condition:Stable  Recent laboratory studies:  Lab Results  Component Value Date   WBC 10.5 07/13/2012   HGB 8.4* 07/13/2012   HCT 26.2* 07/13/2012   MCV 79.9 07/13/2012   PLT 270 07/13/2012   Lab Results  Component Value Date   NA 138 07/13/2012   K 4.4 07/13/2012   CL 103 07/13/2012   CO2 27 07/13/2012   CREATININE 1.06 07/13/2012   GLUCOSE 155* 07/13/2012      Diagnostic Studies: Dg Chest 2 View  07/13/2012  *RADIOLOGY REPORT*  Clinical Data: Chest pain.  CHEST - 2 VIEW  Comparison: 07/11/2012  Findings: Prior CABG.  Cardiomegaly with vascular congestion and probable mild interstitial edema.  Bibasilar atelectasis and small effusions.  No real change since prior study.  No pneumothorax.  IMPRESSION: No interval change.  Original Report Authenticated By: Raelyn Number, M.D.    Discharge Medications: Medication List  As of 07/14/2012  9:29 AM   STOP taking these medications         hydrochlorothiazide 12.5 MG capsule      HYDROcodone-acetaminophen 5-500 MG per tablet         TAKE these medications         aspirin 325 MG tablet   Take 325 mg by mouth daily.      brinzolamide 1 % ophthalmic suspension   Commonly  known as: AZOPT   Place 1 drop into both eyes 3 (three) times daily.      cetirizine 10 MG tablet   Commonly known as: ZYRTEC   Take 10 mg by mouth daily.      Fluticasone-Salmeterol 250-50 MCG/DOSE Aepb   Commonly known as: ADVAIR   Inhale 1 puff into the lungs 2 (two) times daily.      furosemide 40 MG tablet   Commonly known as: LASIX   Take 1 tablet (40 mg total) by mouth daily. For 14 days then stop.      glimepiride 4 MG tablet   Commonly known as: AMARYL   Take 1 tablet (4 mg total) by mouth daily with breakfast.      iron polysaccharides 150 MG capsule   Commonly known as: NIFEREX   Take 1 capsule (150 mg total) by mouth daily. For one meonth then stop.      latanoprost 0.005 % ophthalmic solution    Commonly known as: XALATAN   Place 1 drop into both eyes at bedtime.      lisinopril 5 MG tablet   Commonly known as: PRINIVIL,ZESTRIL   Take 1 tablet (5 mg total) by mouth daily.      metFORMIN 500 MG tablet   Commonly known as: GLUCOPHAGE   Take 1,000 mg by mouth 2 (two) times daily with a meal.      metoprolol tartrate 25 MG tablet   Commonly known as: LOPRESSOR   Take 0.5 tablets (12.5 mg total) by mouth 2 (two) times daily.      potassium chloride SA 20 MEQ tablet   Commonly known as: K-DUR,KLOR-CON   Take 1 tablet (20 mEq total) by mouth daily. For 14 days then stop.      pravastatin 40 MG tablet   Commonly known as: PRAVACHOL   Take 40 mg by mouth at bedtime.      traMADol 50 MG tablet   Commonly known as: ULTRAM   Take 1 tablet (50 mg total) by mouth every 4 (four) hours as needed for pain.             Follow Up Appointments: Follow up with VAN Wilber Oliphant, MD. (PA/LAT CXR to be taken on 08/04/2012 (at [place] which is located in the same building as the office) at 12:30pm;Appointment with Dr. Prescott Gum is on 08/04/2012 at 1:30 pm)   Contact information:  [place]  [place]  [phone]       Follow up with Kirk Ruths, MD. (Call for a follow up appointment for 2 weeks)   Contact information:  [id number], [place]  [place]  [phone]       Follow up with Medical doctor. (Call regarding further diabetes management (HGA1C 10.5))          Signed: Ginelle Bays MPA-C 07/13/2012, 9:19 AM

## 2012-07-13 NOTE — Progress Notes (Signed)
CARDIAC REHAB PHASE I   PRE:  Rate/Rhythm: 79 SR  BP:  Supine:   Sitting: 110/60  Standing:    SaO2: 95 RA  MODE:  Ambulation: 40 ft   POST:  Rate/Rhythem: 87 SR  BP:  Supine:   Sitting: 123/58  Standing:    SaO2: 97 RA 1220-1300 On arrival pt in recliner ask to go to bathroom before walk. Assisted to bathroom had large liquid BM brown. Had to sit in chair to recover. Started on walk pt states that she had to go back to bathroom.Had another large liquid BM. Pt states that she is exhausted after  the two BM and is not able to walk now. Reported to nurse. Pt in recliner with call light in reach.  Deon Pilling

## 2012-07-13 NOTE — Progress Notes (Addendum)
4 Days Post-Op Procedure(s) (LRB): CORONARY ARTERY BYPASS GRAFTING (CABG) (N/A)  Subjective: Patient feels fairly well this am. No complaints.  Objective: Vital signs in last 24 hours: Patient Vitals for the past 24 hrs:  BP Temp Temp src Pulse Resp SpO2 Weight  07/13/12 0356 125/77 mmHg 99 F (37.2 C) Oral 76  20  94 % -  07/13/12 0207 - - - - - - 268 lb 1.3 oz (121.6 kg)  07/12/12 2034 - - - - - 94 % -  07/12/12 2033 - - - - - 94 % -  07/12/12 1932 131/97 mmHg 99.5 F (37.5 C) Oral 75  24  92 % -  07/12/12 1635 116/70 mmHg 99.5 F (37.5 C) Oral 81  20  93 % -  07/12/12 1500 132/73 mmHg - - 78  24  100 % -  07/12/12 1400 122/62 mmHg - - 80  26  99 % -  07/12/12 1300 111/81 mmHg - - 75  20  98 % -  07/12/12 1200 122/90 mmHg - - 76  27  99 % -  07/12/12 1151 - 99.4 F (37.4 C) Oral - - - -  07/12/12 1100 127/67 mmHg - - 76  25  98 % -  07/12/12 1000 113/54 mmHg - - 77  28  97 % -  07/12/12 0900 113/60 mmHg - - 81  26  99 % -  07/12/12 0800 116/66 mmHg - - 76  23  97 % -  07/12/12 0752 - 99.4 F (37.4 C) Oral - - - -   Pre op weight 126 kg Current Weight  07/13/12 268 lb 1.3 oz (121.6 kg)     Intake/Output from previous day: 07/15 0701 - 07/16 0700 In: 880 [P.O.:880] Out: 200 [Urine:200]   Physical Exam:  Cardiovascular: RRR, no murmurs, gallops, or rubs. Pulmonary: Diminished at bases; no rales, wheezes, or rhonchi. Abdomen: Soft, non tender, bowel sounds present. Extremities: Mild bilateral lower extremity edema. Wounds: Clean and dry.  No erythema or signs of infection.  Lab Results: CBC: Basename 07/13/12 0524 07/12/12 0645  WBC 10.5 12.9*  HGB 8.4* 9.0*  HCT 26.2* 27.9*  PLT 270 232   BMET:  Basename 07/13/12 0524 07/12/12 0645  NA 138 137  K 4.4 4.4  CL 103 102  CO2 27 25  GLUCOSE 155* 134*  BUN 23 19  CREATININE 1.06 1.00  CALCIUM 8.7 8.8     Assessment/Plan:  1. CV - SR.Continue Lopressor 12.5 bid.Will restart low dose Lisinopril. 2.   Pulmonary - Encourage incentive spirometer.CXR this am shows no pneumothorax and bilateral small pleural effusions with atelectasis. 3. Volume Overload - Diurese. 4.  Acute blood loss anemia - H and H slightly decreased to 8.4 and 26.2. Will start Nu Iron. 5.DM-CBGs 108/105/129. Pre op HGA1C 10.5.Will increase Amaryl to 4 daily, Metformin 1000 daily, and Insulin PRN. She may need insulin eventually. Will need follow up with medical doctor upon discharge. 6.Remove EPW and CT sutures. 7.Per patient, she initially thought she should go to SNF. She has a cousin who is a Chief Executive Officer who is now willing to stay with her. Likely discharge home instead of SNF. Hopefully, 1-2 days.  ZIMMERMAN,DONIELLE MPA-C 07/13/2012   cxr shows interstitial edema - home on lasix for 2 wks HHN to monitor DM,wounds

## 2012-07-13 NOTE — Progress Notes (Signed)
Patient tells me she plans to go home at d/c- she has her boyfriend, [name], and her CNA who will provide assistance- she states her CNA can be increased from 3-4 hours a day to @8  per day (5 days week). She is open to [facility] and DME as needed- RNCM to follow up for further home needs- a SNF search was initiated in case she needed this option however she declines at this time.  Eduard Clos, MSW, [place]

## 2012-07-14 ENCOUNTER — Encounter: Payer: Self-pay | Admitting: Cardiothoracic Surgery

## 2012-07-14 LAB — GLUCOSE, CAPILLARY
Glucose-Capillary: 108 mg/dL — ABNORMAL HIGH (ref 70–99)
Glucose-Capillary: 83 mg/dL (ref 70–99)
Glucose-Capillary: 64 mg/dL — ABNORMAL LOW (ref 70–99)
Glucose-Capillary: 80 mg/dL (ref 70–99)

## 2012-07-14 MED ORDER — METOLAZONE 5 MG PO TABS
5.0000 mg | ORAL_TABLET | Freq: Every day | ORAL | Status: AC
  Administered 2012-07-14: 5 mg via ORAL
  Filled 2012-07-14: qty 1

## 2012-07-14 NOTE — Progress Notes (Signed)
CARDIAC REHAB PHASE I   PRE:  Rate/Rhythm: 73 SR  BP:  Supine:   Sitting: 123/74  Standing:    SaO2: 98 RA  MODE:  Ambulation: 150 ft   POST:  Rate/Rhythem: 101 ST  BP:  Supine:   Sitting: 133/66  Standing:    SaO2: 96 RA 1120-1145 This is the second attempt to get pt to walk today, earlier she refused related to pain. She has now had pain medication > 45 minutes. Yesterday only walked in hall once when I walked her 40 feet. Assisted X 1and used walker to ambulate. Gait steady with walker, VS stable. Only able to get pt 150 feet c/o of legs hurting and tiredness. Pt back to recliner after walk with call light in reach. Explained that she needs to walk two more times today.  Deon Pilling

## 2012-07-14 NOTE — Progress Notes (Signed)
Pacing wires pulled at 1350 per MD order. All wires intact after removal. Pt tolerated well. Vital signs remained stable. No arrhythmias noted. Bed rest until 1450. Call bell in reach. Marko Plume

## 2012-07-14 NOTE — Progress Notes (Addendum)
5 Days Post-Op Procedure(s) (LRB): CORONARY ARTERY BYPASS GRAFTING (CABG) (N/A)  Subjective: Patient without complaints.  Objective: Vital signs in last 24 hours: Patient Vitals for the past 24 hrs:  BP Temp Temp src Pulse Resp SpO2 Weight  07/14/12 0712 - - - - - 92 % -  07/14/12 0424 118/72 mmHg 98.5 F (36.9 C) Oral 74  19  100 % 251 lb 1.7 oz (113.9 kg)  07/13/12 2127 - - - - - 98 % -  07/13/12 2126 - - - - - 98 % -  07/13/12 2025 137/62 mmHg 97.8 F (36.6 C) Oral 77  20  95 % -  07/13/12 1450 113/65 mmHg 98.4 F (36.9 C) Oral 70  20  97 % -  07/13/12 0925 - - - - - 100 % -  07/13/12 0921 - - - - - 100 % -   Pre op weight 126 kg Current Weight  07/14/12 251 lb 1.7 oz (113.9 kg)     Intake/Output from previous day: 07/16 0701 - 07/17 0700 In: 600 [P.O.:600] Out: 300 [Urine:300]   Physical Exam:  Cardiovascular: RRR, no murmurs, gallops, or rubs. Pulmonary: Diminished at bases; no rales, wheezes, or rhonchi. Abdomen: Soft, non tender, bowel sounds present. Extremities: Mild bilateral lower extremity edema. Wounds: Clean and dry.  No erythema or signs of infection.  Lab Results: CBC:  Basename 07/13/12 0524 07/12/12 0645  WBC 10.5 12.9*  HGB 8.4* 9.0*  HCT 26.2* 27.9*  PLT 270 232   BMET:   Basename 07/13/12 0524 07/12/12 0645  NA 138 137  K 4.4 4.4  CL 103 102  CO2 27 25  GLUCOSE 155* 134*  BUN 23 19  CREATININE 1.06 1.00  CALCIUM 8.7 8.8     Assessment/Plan:  1. CV - SR.Continue Lopressor 12.5 bid, Lisinopril 5 daily. 2.  Pulmonary - Encourage incentive spirometer.CXR yesterday showed no pneumothorax, bilateral small pleural effusions with atelectasis,and some vascular congestion. 3. Volume Overload - Continue with diiuresis. 4.  Acute blood loss anemia - Last H and H slightly decreased to 8.4 and 26.2.Continue Nu Iron. 5.DM-CBGs 133/126/108. Pre op HGA1C 10.5.Will increase Amaryl to 4 daily, Metformin 1000 daily, and Insulin PRN. She may need  insulin eventually. Will need follow up with medical doctor upon discharge. 6.Remove EPW today and CT sutures in am 7.Per patient, she has arrangements to go home tomorrow.Discharge in am with [facility].  ZIMMERMAN,DONIELLE MPA-C 07/14/2012  patient examined and medical record reviewed,agree with above note. [name],Alejandria Wessells 07/14/2012

## 2012-07-15 LAB — GLUCOSE, CAPILLARY: Glucose-Capillary: 120 mg/dL — ABNORMAL HIGH (ref 70–99)

## 2012-07-15 MED ORDER — TRAMADOL HCL 50 MG PO TABS: 50.0000 mg | ORAL_TABLET | ORAL | Status: DC | PRN

## 2012-07-15 MED ORDER — GLIMEPIRIDE 4 MG PO TABS: 4.0000 mg | ORAL_TABLET | Freq: Every day | ORAL | Status: DC

## 2012-07-15 MED ORDER — FLUTICASONE-SALMETEROL 250-50 MCG/DOSE IN AEPB: 1.0000 | INHALATION_SPRAY | Freq: Two times a day (BID) | RESPIRATORY_TRACT | Status: DC

## 2012-07-15 MED ORDER — POLYSACCHARIDE IRON COMPLEX 150 MG PO CAPS: 150.0000 mg | ORAL_CAPSULE | Freq: Every day | ORAL | Status: DC

## 2012-07-15 NOTE — Progress Notes (Signed)
Patient and aunt at bedside are now indicating that patient is agreable to SNF placement. We have not rec'd any offers (medicaid only). I am working with [facility] to determine if they can take patient with Medicaid. Will update- Eduard Clos, MSW, [place]

## 2012-07-15 NOTE — Progress Notes (Signed)
Chest tube sutures removed at this time per MD order; steristrips applied at this time to site; no bleeding noted; will cont. To monitor

## 2012-07-15 NOTE — Progress Notes (Signed)
Pt sitting up in chair; Pt encouraged to get up and ambulate at this time; family in room; pt not wanting to ambulate at this time; states she will shortly; still awaiting bed availability at SNF; will cont. To monitor.

## 2012-07-15 NOTE — Progress Notes (Signed)
Pt now states that she would like to go to rehab instead of going home; CM in to speak with pt at this time; pt aunt at bedside as well; PA made aware of d/c delay.

## 2012-07-15 NOTE — Progress Notes (Signed)
Pt still awaiting bed availability from SNF; pt still has not ambulated out of room today; pt encouraged; pt not wanting to ambulate at this time; family in room; will try at a later time.

## 2012-07-15 NOTE — Progress Notes (Addendum)
6 Days Post-Op Procedure(s) (LRB): CORONARY ARTERY BYPASS GRAFTING (CABG) (N/A)  Subjective: Patient without complaints and wants to go home today.  Objective: Vital signs in last 24 hours: Patient Vitals for the past 24 hrs:  BP Temp Temp src Pulse Resp SpO2 Weight  07/15/12 0345 125/67 mmHg 98.9 F (37.2 C) Oral 77  18  93 % 255 lb 8.2 oz (115.9 kg)  07/15/12 0136 - - - - - 96 % -  07/14/12 2033 115/67 mmHg 99.2 F (37.3 C) Oral 73  20  95 % -  07/14/12 2017 - - - - - 93 % -  07/14/12 2016 - - - - - 93 % -  07/14/12 1405 - - - - - 94 % -  07/14/12 1352 126/62 mmHg 98.5 F (36.9 C) Oral 68  20  - -  07/14/12 1345 124/64 mmHg - - 75  - - -   Pre op weight 126 kg Current Weight  07/15/12 255 lb 8.2 oz (115.9 kg)     Intake/Output from previous day: 07/17 0701 - 07/18 0700 In: 960 [P.O.:960] Out: -    Physical Exam:  Cardiovascular: RRR, no murmurs, gallops, or rubs. Pulmonary: Diminished at bases; no rales, wheezes, or rhonchi. Abdomen: Soft, non tender, bowel sounds present. Extremities: Mild bilateral lower extremity edema. Wounds: Clean and dry.  No erythema or signs of infection.  Lab Results: CBC:  Basename 07/13/12 0524  WBC 10.5  HGB 8.4*  HCT 26.2*  PLT 270   BMET:   Basename 07/13/12 0524  NA 138  K 4.4  CL 103  CO2 27  GLUCOSE 155*  BUN 23  CREATININE 1.06  CALCIUM 8.7     Assessment/Plan:  1. CV - Had 5 beats of NSVT around 3:38 this am.SR since then.Continue Lopressor 12.5 bid, Lisinopril 5 daily. 2.  Pulmonary - Encourage incentive spirometer. 3. Volume Overload - Continue with diuresis. 4.  Acute blood loss anemia - Last H and H slightly decreased to 8.4 and 26.2.Continue Nu Iron. 5.DM-CBGs 80/123/120. Pre op HGA1C 10.5.Will increase Amaryl to 4 daily, Metformin 1000 daily, and Insulin PRN. She may need insulin eventually. Will need follow up with medical doctor upon discharge. 6.Remove CT sutures 7.Likely discharge later  today.  ZIMMERMAN,DONIELLE MPA-C 07/15/2012   Home today I have seen and examined Rosemary Holms and agree with the above assessment  and plan.  [name] MD Beeper [phone] Office [phone] 07/15/2012 9:40 AM

## 2012-07-15 NOTE — Progress Notes (Signed)
[id number] Completed discharge education with pt. She agrees to [facility]. CRP in [place], will send referral.

## 2012-07-15 NOTE — Progress Notes (Signed)
No SNF beds available; pt to d/c home with family; family encouraged to provide support; pt to d/c home via ambulance; SW to notify ambulance of d/c.

## 2012-07-15 NOTE — Progress Notes (Signed)
[id number] Cardiac Rehab Completed discharge education with pt's family and answered their questions.Reviewed with pt also.

## 2012-07-15 NOTE — Progress Notes (Signed)
Pt had 5 beats of VTACH , Temp 98.9 Pulse 77, BP 125/67, O2 93% on RA. Pt  Asymptomatic. Will continue to monitor.   Aislin Onofre M

## 2012-07-28 ENCOUNTER — Other Ambulatory Visit: Payer: Self-pay | Admitting: Cardiothoracic Surgery

## 2012-07-28 DIAGNOSIS — I2510 Atherosclerotic heart disease of native coronary artery without angina pectoris: Secondary | ICD-10-CM

## 2012-08-04 ENCOUNTER — Ambulatory Visit
Admission: RE | Admit: 2012-08-04 | Discharge: 2012-08-04 | Disposition: A | Discharge status: Home / Self Care | Payer: Medicaid Other | Source: Ambulatory Visit | Attending: Cardiothoracic Surgery | Admitting: Cardiothoracic Surgery

## 2012-08-04 ENCOUNTER — Encounter: Payer: Self-pay | Admitting: Cardiothoracic Surgery

## 2012-08-04 ENCOUNTER — Other Ambulatory Visit: Payer: Self-pay | Admitting: Cardiothoracic Surgery

## 2012-08-04 ENCOUNTER — Ambulatory Visit (INDEPENDENT_AMBULATORY_CARE_PROVIDER_SITE_OTHER): Payer: Self-pay | Admitting: Cardiothoracic Surgery

## 2012-08-04 VITALS — BP 125/84 | HR 88 | Resp 16 | Ht 69.0 in | Wt 257.0 lb

## 2012-08-04 DIAGNOSIS — I2510 Atherosclerotic heart disease of native coronary artery without angina pectoris: Secondary | ICD-10-CM

## 2012-08-04 DIAGNOSIS — Z09 Encounter for follow-up examination after completed treatment for conditions other than malignant neoplasm: Secondary | ICD-10-CM

## 2012-08-04 NOTE — Progress Notes (Signed)
PCP is No primary provider on file. Referring Provider is Stanford Breed Denice Bors, MD  Chief Complaint  Patient presents with  . Routine Post Op    CABG 07/09/12 with cxr    HPI: First office visit for this poorly controlled diabetic smoker who needed multivessel bypass grafting when she presented with unstable angina and severe three-vessel disease. since returning home she has had no recurrent angina. Her diabetic control is questionable. She's had some ankle swelling has been taking Lasix 40 mg daily. Her chest x-ray today shows clear lung fields without pleural effusions.. Postoperative she had some respiratory problems due to her heavy smoking history. She was eventually weaned off her oxygen, remained in sinus rhythm was discharged to home on aspirin, some inhalers and Amaryl.   Past Medical History  Diagnosis Date  . Hypertension   . Diabetes mellitus     diagnosed in 2008  . Glaucoma   . Hyperlipidemia   . CVA (cerebral infarction)     right internal capsule stroke in 12/2006  . Left-sided sensory deficit present   . Cocaine abuse     crack cocaine heavily until 2008 then sporatic use since then  . Tobacco abuse   . Thyroid nodule     FNA in [id number] showed follicular cells but not definate neoplasm    Past Surgical History  Procedure Date  . Coronary artery bypass graft 07/09/2012    Procedure: CORONARY ARTERY BYPASS GRAFTING (CABG);  Surgeon: Ivin Poot, MD;  Location: [place];  Service: Open Heart Surgery;  Laterality: N/A;    Family History  Problem Relation Age of Onset  . Other      no known family CAD    Social History History  Substance Use Topics  . Smoking status: Current Everyday Smoker -- 1.0 packs/day  . Smokeless tobacco: Not on file  . Alcohol Use: No    Current Outpatient Prescriptions  Medication Sig Dispense Refill  . aspirin 325 MG tablet Take 325 mg by mouth daily.      . brinzolamide (AZOPT) 1 % ophthalmic suspension Place 1 drop into both eyes 3  (three) times daily.      . cetirizine (ZYRTEC) 10 MG tablet Take 10 mg by mouth daily.      . Fluticasone-Salmeterol (ADVAIR) 250-50 MCG/DOSE AEPB Inhale 1 puff into the lungs 2 (two) times daily.  60 each  0  . furosemide (LASIX) 40 MG tablet Take 1 tablet (40 mg total) by mouth daily. For 14 days then stop.  14 tablet  0  . glimepiride (AMARYL) 4 MG tablet Take 1 tablet (4 mg total) by mouth daily with breakfast.  30 tablet  1  . latanoprost (XALATAN) 0.005 % ophthalmic solution Place 1 drop into both eyes at bedtime.      . metFORMIN (GLUCOPHAGE) 500 MG tablet Take 1,000 mg by mouth 2 (two) times daily with a meal.      . metoprolol tartrate (LOPRESSOR) 25 MG tablet Take 0.5 tablets (12.5 mg total) by mouth 2 (two) times daily.  30 tablet  1  . potassium chloride SA (K-DUR,KLOR-CON) 20 MEQ tablet Take 1 tablet (20 mEq total) by mouth daily. For 14 days then stop.  14 tablet  0  . pravastatin (PRAVACHOL) 40 MG tablet Take 40 mg by mouth at bedtime.      . traMADol (ULTRAM) 50 MG tablet Take 1 tablet (50 mg total) by mouth every 4 (four) hours as needed for pain.  [place]  tablet  0  . lisinopril (PRINIVIL,ZESTRIL) 5 MG tablet Take 1 tablet (5 mg total) by mouth daily.  30 tablet  1    No Known Allergies  Review of Systems no fever feels well  BP 125/84  Pulse 88  Resp 16  Ht 5\' 9"  (1.753 m)  Wt 257 lb (116.574 kg)  BMI 37.95 kg/m2  SpO2 98% Physical Exam Sternal incision has some eschar formation. There is a small subcutaneous pocket in the upper third which is clean and packed with a half-inch Nu Gauze dressing. The upper incision was painted with Betadine a dry dressing applied. Breath sounds are clear chest x-rays clear She is mild-to-moderate ankle edema bilaterally leg incision healing  Diagnostic Tests: Chest x-ray clear  Impression: Superficial nonhealing upper sternal incision with some eschar formation no saline is or significant drainage but there is a small subcutaneous  defect was packed. No deep sinus extension or purulence noted. Culture taken and antibiotics started Keflex 500 3 times a day x10 days  return for wound check on August 19  Plan:

## 2012-08-07 NOTE — Discharge Summary (Signed)
patient examined and medical record reviewed,agree with above note. [name],PETER 08/07/2012

## 2012-08-08 LAB — CULTURE, ROUTINE-ABSCESS: Gram Stain: NONE SEEN

## 2012-08-11 ENCOUNTER — Emergency Department (HOSPITAL_COMMUNITY)
Admission: EM | Admit: 2012-08-11 | Discharge: 2012-08-11 | Disposition: A | Discharge status: Home / Self Care | Payer: Medicaid Other | Attending: Emergency Medicine | Admitting: Emergency Medicine

## 2012-08-11 ENCOUNTER — Emergency Department (HOSPITAL_COMMUNITY): Payer: Medicaid Other

## 2012-08-11 DIAGNOSIS — E119 Type 2 diabetes mellitus without complications: Secondary | ICD-10-CM | POA: Insufficient documentation

## 2012-08-11 DIAGNOSIS — R079 Chest pain, unspecified: Secondary | ICD-10-CM | POA: Insufficient documentation

## 2012-08-11 DIAGNOSIS — Z951 Presence of aortocoronary bypass graft: Secondary | ICD-10-CM | POA: Insufficient documentation

## 2012-08-11 DIAGNOSIS — E785 Hyperlipidemia, unspecified: Secondary | ICD-10-CM | POA: Insufficient documentation

## 2012-08-11 DIAGNOSIS — T8149XA Infection following a procedure, other surgical site, initial encounter: Secondary | ICD-10-CM

## 2012-08-11 DIAGNOSIS — Z8673 Personal history of transient ischemic attack (TIA), and cerebral infarction without residual deficits: Secondary | ICD-10-CM | POA: Insufficient documentation

## 2012-08-11 DIAGNOSIS — I10 Essential (primary) hypertension: Secondary | ICD-10-CM | POA: Insufficient documentation

## 2012-08-11 DIAGNOSIS — F17200 Nicotine dependence, unspecified, uncomplicated: Secondary | ICD-10-CM | POA: Insufficient documentation

## 2012-08-11 LAB — CBC WITH DIFFERENTIAL/PLATELET
Basophils Absolute: 0 10*3/uL (ref 0.0–0.1)
Basophils Relative: 0 % (ref 0–1)
Hemoglobin: 11.8 g/dL — ABNORMAL LOW (ref 12.0–15.0)
MCHC: 32.9 g/dL (ref 30.0–36.0)
Monocytes Relative: 14 % — ABNORMAL HIGH (ref 3–12)
Neutro Abs: 8.7 10*3/uL — ABNORMAL HIGH (ref 1.7–7.7)
Neutrophils Relative %: 71 % (ref 43–77)
RDW: 18.1 % — ABNORMAL HIGH (ref 11.5–15.5)

## 2012-08-11 LAB — BASIC METABOLIC PANEL
Chloride: 97 mEq/L (ref 96–112)
GFR calc Af Amer: 64 mL/min — ABNORMAL LOW (ref >90)
Potassium: 4 mEq/L (ref 3.5–5.1)

## 2012-08-11 LAB — CARDIAC PANEL(CRET KIN+CKTOT+MB+TROPI)
Relative Index: INVALID (ref 0.0–2.5)
Total CK: 40 U/L (ref 7–177)
Troponin I: <0.3 ng/mL (ref <0.30)

## 2012-08-11 MED ORDER — OXYCODONE-ACETAMINOPHEN 5-325 MG PO TABS
2.0000 | ORAL_TABLET | Freq: Once | ORAL | Status: AC
  Administered 2012-08-11: 2 via ORAL
  Filled 2012-08-11: qty 2

## 2012-08-11 MED ORDER — HYDROCODONE-ACETAMINOPHEN 5-325 MG PO TABS: 2.0000 | ORAL_TABLET | ORAL | Status: DC | PRN

## 2012-08-11 NOTE — ED Provider Notes (Signed)
History     CSN: [id number]  Arrival date & time 08/11/12  [id number]   First MD Initiated Contact with Patient 08/11/12 0602      Chief Complaint  Patient presents with  . Chest Pain    open heart incision site painful and having purulent drainage    (Consider location/radiation/quality/duration/timing/severity/associated sxs/prior treatment) HPI Comments: Patient comes in today with a chief complaint of pain at the site of her sternal incision.  Patient had multivessel CABG performed on 07/09/12.  She was evaluated in follow up by CT surgery on 08/04/12.  At that time it was found that her sternal incision had an area that was not healing well and had a small subcutaneous defect that was packed.  Area was cultured, which showed that multiple organisms were present, but non predominant.  Patient removed the packing this morning.  Patient has a follow up appointment with CT scheduled in 5 daysOn 08/04/12 she was started on Keflex 500mg  tid x 10 days.  Patient reports that she has been taking the medication two or three times a day.  She feels that the pain at the incision site has become progressively worse.  She denies fever or chills.  Denies SOB.  No nausea or vomiting.    The history is provided by the patient and medical records.    Past Medical History  Diagnosis Date  . Hypertension   . Diabetes mellitus     diagnosed in 2008  . Glaucoma   . Hyperlipidemia   . CVA (cerebral infarction)     right internal capsule stroke in 12/2006  . Left-sided sensory deficit present   . Cocaine abuse     crack cocaine heavily until 2008 then sporatic use since then  . Tobacco abuse   . Thyroid nodule     FNA in [id number] showed follicular cells but not definate neoplasm    Past Surgical History  Procedure Date  . Coronary artery bypass graft 07/09/2012    Procedure: CORONARY ARTERY BYPASS GRAFTING (CABG);  Surgeon: Ivin Poot, MD;  Location: [place];  Service: Open Heart Surgery;  Laterality: N/A;      Family History  Problem Relation Age of Onset  . Other      no known family CAD    History  Substance Use Topics  . Smoking status: Current Everyday Smoker -- 1.0 packs/day  . Smokeless tobacco: Not on file  . Alcohol Use: No    OB History    Grav Para Term Preterm Abortions TAB SAB Ect Mult Living                  Review of Systems  Constitutional: Negative for fever and chills.  Respiratory: Negative for chest tightness and shortness of breath.   Cardiovascular: Negative for leg swelling.       Pain at sternal incision site  Gastrointestinal: Negative for nausea and vomiting.  Skin: Positive for wound.  Neurological: Negative for dizziness, syncope and light-headedness.    Allergies  Review of patient's allergies indicates no known allergies.  Home Medications   Current Outpatient Rx  Name Route Sig Dispense Refill  . ASPIRIN 325 MG PO TABS Oral Take 325 mg by mouth daily.    Marland Kitchen BRINZOLAMIDE 1 % OP SUSP Both Eyes Place 1 drop into both eyes 3 (three) times daily.    Marland Kitchen CETIRIZINE HCL 10 MG PO TABS Oral Take 10 mg by mouth daily.    Marland Kitchen FLUTICASONE-SALMETEROL 250-50 MCG/DOSE  IN AEPB Inhalation Inhale 1 puff into the lungs 2 (two) times daily. 60 each 0  . FUROSEMIDE 40 MG PO TABS Oral Take 1 tablet (40 mg total) by mouth daily. For 14 days then stop. 14 tablet 0  . GLIMEPIRIDE 4 MG PO TABS Oral Take 1 tablet (4 mg total) by mouth daily with breakfast. 30 tablet 1  . LATANOPROST 0.005 % OP SOLN Both Eyes Place 1 drop into both eyes at bedtime.    Marland Kitchen LISINOPRIL 5 MG PO TABS Oral Take 1 tablet (5 mg total) by mouth daily. 30 tablet 1  . METFORMIN HCL 500 MG PO TABS Oral Take 1,000 mg by mouth 2 (two) times daily with a meal.    . METOPROLOL TARTRATE 25 MG PO TABS Oral Take 0.5 tablets (12.5 mg total) by mouth 2 (two) times daily. 30 tablet 1  . POTASSIUM CHLORIDE CRYS ER 20 MEQ PO TBCR Oral Take 1 tablet (20 mEq total) by mouth daily. For 14 days then stop. 14 tablet 0   . PRAVASTATIN SODIUM 40 MG PO TABS Oral Take 40 mg by mouth at bedtime.    . TRAMADOL HCL 50 MG PO TABS Oral Take 1 tablet (50 mg total) by mouth every 4 (four) hours as needed for pain. 40 tablet 0    There were no vitals taken for this visit.  Physical Exam  Nursing note and vitals reviewed. Constitutional: She appears well-developed and well-nourished. No distress.  HENT:  Head: Normocephalic and atraumatic.  Mouth/Throat: Oropharynx is clear and moist.  Neck: Normal range of motion. Neck supple.  Cardiovascular: Normal rate, regular rhythm, normal heart sounds and intact distal pulses.   Pulmonary/Chest: Effort normal and breath sounds normal. No respiratory distress. She has no wheezes. She exhibits tenderness.       Incision site from previous CABG.  One cm open area with purulent drainage over the incision.  No surrounding erythema or warmth.  Abdominal: Soft. There is no tenderness.  Musculoskeletal: Normal range of motion.  Neurological: She is alert.  Skin: Skin is warm. She is not diaphoretic.  Psychiatric: She has a normal mood and affect.    ED Course  Procedures (including critical care time)  Labs Reviewed  CBC WITH DIFFERENTIAL - Abnormal; Notable for the following:    WBC 12.2 (*)     Hemoglobin 11.8 (*)     HCT 35.9 (*)     RDW 18.1 (*)     Neutro Abs 8.7 (*)     Monocytes Relative 14 (*)     Monocytes Absolute 1.7 (*)     All other components within normal limits  BASIC METABOLIC PANEL - Abnormal; Notable for the following:    Sodium 134 (*)     Glucose, Bld 297 (*)     GFR calc non Af Amer 55 (*)     GFR calc Af Amer 64 (*)     All other components within normal limits  CARDIAC PANEL(CRET KIN+CKTOT+MB+TROPI)  URINALYSIS, ROUTINE W REFLEX MICROSCOPIC   Dg Chest 2 View  08/11/2012  *RADIOLOGY REPORT*  Clinical Data:   Chest pain.  Draining wound anterior chest.  CHEST - 2 VIEW  Comparison: 08/04/2012.  Findings: Mild basilar atelectasis.  No airspace  disease.  Similar appearance of the chest compared yesterday's examination.  The patient is rotated to the right.  Tortuous thoracic aorta.  IMPRESSION: No interval change.  Basilar atelectasis.  No acute disease.  Original Report Authenticated By: Cay Schillings  Gerilyn Pilgrim, M.D.     No diagnosis found.  Area cleaned well and wet to dry dressing was placed on the area.  Nursing staff educated patient on how to apply dressings and patient was given supplies.   Date: 08/11/2012  Rate: 107  Rhythm: normal sinus rhythm  QRS Axis: normal  Intervals: normal  ST/T Wave abnormalities: nonspecific T wave changes  Conduction Disutrbances:none  Narrative Interpretation: EKG has improved from last EKG  Old EKG Reviewed: changes noted    MDM  Patient with previous CABG performed on 07/09/12 comes in today with pain at the site of her incision.  Incision site with purulent drainage, but no surrounding erythema or warmth.  Area has been cultured again.  Patient afebrile.  No acute findings on CXR.  Patient instructed to continue taking the Keflex and follow up with CT surgery in five days as scheduled.  Area cleaned well while in the ED and patient explained the importance of keeping the area clean.  Return precautions have been discussed with the patient.        Sherlyn Lees Juneau, PA-C 08/11/12 2027

## 2012-08-11 NOTE — ED Notes (Signed)
Dressing material provided to patient for 2 days of wet to dry dressing and cleaning.  Pt instructed in procedure for wet to dry dressing.

## 2012-08-11 NOTE — ED Notes (Signed)
Report received Mortimer Fries, RN

## 2012-08-11 NOTE — ED Notes (Signed)
Pt instructed in wound care and provided dressing materials for wound care at home for 2 days.  Pt relayed understanding

## 2012-08-12 NOTE — ED Provider Notes (Signed)
Medical screening examination/treatment/procedure(s) were conducted as a shared visit with non-physician practitioner(s) and myself.  I personally evaluated the patient during the encounter.  Pt s/p CABG, has residual poor wound healing and non cellulitic incisional wound.  PT c/o pain to the area.  She is on abx.  Recently seen by her thoracic surgeon for same and per their not not much change today.  Education and to f/u with her surgeon  Kalman Drape, MD 08/12/12 [phone]

## 2012-08-14 LAB — WOUND CULTURE

## 2012-08-15 NOTE — ED Notes (Signed)
+  Wound. Chart sent to EDP office for review. 

## 2012-08-16 ENCOUNTER — Encounter (HOSPITAL_COMMUNITY): Payer: Self-pay | Admitting: *Deleted

## 2012-08-16 ENCOUNTER — Ambulatory Visit: Payer: Self-pay | Admitting: Cardiothoracic Surgery

## 2012-08-16 ENCOUNTER — Encounter: Payer: Self-pay | Admitting: Cardiothoracic Surgery

## 2012-08-16 ENCOUNTER — Inpatient Hospital Stay (HOSPITAL_COMMUNITY)
Admission: AD | Admit: 2012-08-16 | Discharge: 2012-09-24 | DRG: 982 | Disposition: A | Discharge status: Skilled Nursing Facility | Payer: Medicaid Other | Source: Ambulatory Visit | Attending: Cardiothoracic Surgery | Admitting: Cardiothoracic Surgery

## 2012-08-16 ENCOUNTER — Ambulatory Visit (INDEPENDENT_AMBULATORY_CARE_PROVIDER_SITE_OTHER): Payer: Self-pay | Admitting: Cardiothoracic Surgery

## 2012-08-16 VITALS — BP 134/89 | HR 104 | Temp 97.5°F | Resp 20 | Ht 69.0 in | Wt 248.0 lb

## 2012-08-16 DIAGNOSIS — B961 Klebsiella pneumoniae [K. pneumoniae] as the cause of diseases classified elsewhere: Secondary | ICD-10-CM | POA: Diagnosis present

## 2012-08-16 DIAGNOSIS — Y832 Surgical operation with anastomosis, bypass or graft as the cause of abnormal reaction of the patient, or of later complication, without mention of misadventure at the time of the procedure: Secondary | ICD-10-CM | POA: Diagnosis present

## 2012-08-16 DIAGNOSIS — Z7982 Long term (current) use of aspirin: Secondary | ICD-10-CM

## 2012-08-16 DIAGNOSIS — E785 Hyperlipidemia, unspecified: Secondary | ICD-10-CM | POA: Diagnosis present

## 2012-08-16 DIAGNOSIS — I2510 Atherosclerotic heart disease of native coronary artery without angina pectoris: Secondary | ICD-10-CM | POA: Diagnosis present

## 2012-08-16 DIAGNOSIS — M869 Osteomyelitis, unspecified: Secondary | ICD-10-CM | POA: Diagnosis present

## 2012-08-16 DIAGNOSIS — K5900 Constipation, unspecified: Secondary | ICD-10-CM | POA: Diagnosis not present

## 2012-08-16 DIAGNOSIS — T8140XA Infection following a procedure, unspecified, initial encounter: Secondary | ICD-10-CM

## 2012-08-16 DIAGNOSIS — E119 Type 2 diabetes mellitus without complications: Secondary | ICD-10-CM | POA: Diagnosis present

## 2012-08-16 DIAGNOSIS — H409 Unspecified glaucoma: Secondary | ICD-10-CM | POA: Diagnosis present

## 2012-08-16 DIAGNOSIS — I10 Essential (primary) hypertension: Secondary | ICD-10-CM | POA: Diagnosis present

## 2012-08-16 DIAGNOSIS — L98499 Non-pressure chronic ulcer of skin of other sites with unspecified severity: Secondary | ICD-10-CM

## 2012-08-16 DIAGNOSIS — R609 Edema, unspecified: Secondary | ICD-10-CM | POA: Diagnosis present

## 2012-08-16 DIAGNOSIS — T8149XA Infection following a procedure, other surgical site, initial encounter: Secondary | ICD-10-CM

## 2012-08-16 DIAGNOSIS — I252 Old myocardial infarction: Secondary | ICD-10-CM

## 2012-08-16 DIAGNOSIS — Y92009 Unspecified place in unspecified non-institutional (private) residence as the place of occurrence of the external cause: Secondary | ICD-10-CM

## 2012-08-16 DIAGNOSIS — J9819 Other pulmonary collapse: Secondary | ICD-10-CM | POA: Diagnosis not present

## 2012-08-16 DIAGNOSIS — F1411 Cocaine abuse, in remission: Secondary | ICD-10-CM | POA: Diagnosis present

## 2012-08-16 DIAGNOSIS — F17200 Nicotine dependence, unspecified, uncomplicated: Secondary | ICD-10-CM | POA: Diagnosis present

## 2012-08-16 DIAGNOSIS — Z951 Presence of aortocoronary bypass graft: Secondary | ICD-10-CM

## 2012-08-16 DIAGNOSIS — Z8673 Personal history of transient ischemic attack (TIA), and cerebral infarction without residual deficits: Secondary | ICD-10-CM

## 2012-08-16 DIAGNOSIS — B964 Proteus (mirabilis) (morganii) as the cause of diseases classified elsewhere: Secondary | ICD-10-CM | POA: Diagnosis present

## 2012-08-16 DIAGNOSIS — Z6837 Body mass index (BMI) 37.0-37.9, adult: Secondary | ICD-10-CM

## 2012-08-16 DIAGNOSIS — J90 Pleural effusion, not elsewhere classified: Secondary | ICD-10-CM | POA: Diagnosis not present

## 2012-08-16 DIAGNOSIS — T827XXA Infection and inflammatory reaction due to other cardiac and vascular devices, implants and grafts, initial encounter: Principal | ICD-10-CM | POA: Diagnosis present

## 2012-08-16 DIAGNOSIS — E669 Obesity, unspecified: Secondary | ICD-10-CM | POA: Diagnosis present

## 2012-08-16 DIAGNOSIS — R112 Nausea with vomiting, unspecified: Secondary | ICD-10-CM | POA: Diagnosis not present

## 2012-08-16 HISTORY — DX: Cerebral infarction, unspecified: I63.9

## 2012-08-16 HISTORY — DX: Atherosclerotic heart disease of native coronary artery without angina pectoris: I25.10

## 2012-08-16 LAB — BLOOD GAS, ARTERIAL
Acid-Base Excess: 3.1 mmol/L — ABNORMAL HIGH (ref 0.0–2.0)
Bicarbonate: 26.8 mEq/L — ABNORMAL HIGH (ref 20.0–24.0)
Drawn by: 129711
FIO2: 0.21 %
O2 Saturation: 94.7 %
Patient temperature: 98.6
TCO2: 27.9 mmol/L (ref 0–100)
pCO2 arterial: 38.1 mmHg (ref 35.0–45.0)
pH, Arterial: 7.46 — ABNORMAL HIGH (ref 7.350–7.450)
pO2, Arterial: 63.6 mmHg — ABNORMAL LOW (ref 80.0–100.0)

## 2012-08-16 LAB — COMPREHENSIVE METABOLIC PANEL
ALT: 8 U/L (ref 0–35)
AST: 11 U/L (ref 0–37)
Albumin: 2.6 g/dL — ABNORMAL LOW (ref 3.5–5.2)
Alkaline Phosphatase: 91 U/L (ref 39–117)
BUN: 14 mg/dL (ref 6–23)
CO2: 28 mEq/L (ref 19–32)
Calcium: 10 mg/dL (ref 8.4–10.5)
Chloride: 101 mEq/L (ref 96–112)
Creatinine, Ser: 0.9 mg/dL (ref 0.50–1.10)
GFR calc Af Amer: 78 mL/min — ABNORMAL LOW (ref >90)
GFR calc non Af Amer: 68 mL/min — ABNORMAL LOW (ref >90)
Glucose, Bld: 274 mg/dL — ABNORMAL HIGH (ref 70–99)
Potassium: 4.2 mEq/L (ref 3.5–5.1)
Sodium: 138 mEq/L (ref 135–145)
Total Bilirubin: 0.4 mg/dL (ref 0.3–1.2)
Total Protein: 7.4 g/dL (ref 6.0–8.3)

## 2012-08-16 LAB — CBC
HCT: 32.6 % — ABNORMAL LOW (ref 36.0–46.0)
Hemoglobin: 10.5 g/dL — ABNORMAL LOW (ref 12.0–15.0)
MCH: 25.4 pg — ABNORMAL LOW (ref 26.0–34.0)
MCHC: 32.2 g/dL (ref 30.0–36.0)
MCV: 78.9 fL (ref 78.0–100.0)
Platelets: 425 10*3/uL — ABNORMAL HIGH (ref 150–400)
RBC: 4.13 MIL/uL (ref 3.87–5.11)
RDW: 18 % — ABNORMAL HIGH (ref 11.5–15.5)
WBC: 7.2 10*3/uL (ref 4.0–10.5)

## 2012-08-16 LAB — GLUCOSE, CAPILLARY
Glucose-Capillary: 300 mg/dL — ABNORMAL HIGH (ref 70–99)
Glucose-Capillary: 217 mg/dL — ABNORMAL HIGH (ref 70–99)

## 2012-08-16 LAB — HEMOGLOBIN A1C
Hgb A1c MFr Bld: 7.2 % — ABNORMAL HIGH (ref <5.7)
Mean Plasma Glucose: 160 mg/dL — ABNORMAL HIGH (ref <117)

## 2012-08-16 LAB — TYPE AND SCREEN
ABO/RH(D): O POS
Antibody Screen: NEGATIVE

## 2012-08-16 LAB — PROTIME-INR
INR: 1.12 (ref 0.00–1.49)
Prothrombin Time: 14.6 seconds (ref 11.6–15.2)

## 2012-08-16 LAB — APTT: aPTT: 28 seconds (ref 24–37)

## 2012-08-16 MED ORDER — GLIMEPIRIDE 1 MG PO TABS: 4.0000 mg | ORAL_TABLET | Freq: Every day | ORAL | Status: DC

## 2012-08-16 MED ORDER — METOPROLOL TARTRATE 12.5 MG HALF TABLET: 25.0000 mg | ORAL_TABLET | Freq: Two times a day (BID) | ORAL | Status: DC

## 2012-08-16 MED ORDER — INSULIN GLARGINE 100 UNIT/ML ~~LOC~~ SOLN
20.0000 [IU] | Freq: Every day | SUBCUTANEOUS | Status: DC
  Administered 2012-08-16: 20 [IU] via SUBCUTANEOUS

## 2012-08-16 MED ORDER — LORATADINE 10 MG PO TABS: 10.0000 mg | ORAL_TABLET | Freq: Every day | ORAL | Status: DC

## 2012-08-16 MED ORDER — PIPERACILLIN-TAZOBACTAM 3.375 G IVPB
3.3750 g | Freq: Three times a day (TID) | INTRAVENOUS | Status: DC
  Administered 2012-08-16 – 2012-08-20 (×9): 3.375 g via INTRAVENOUS
  Filled 2012-08-16 (×12): qty 50

## 2012-08-16 MED ORDER — DEXTROSE 5 % IV SOLN
1.5000 g | INTRAVENOUS | Status: AC
  Administered 2012-08-17: 1.5 g via INTRAVENOUS
  Filled 2012-08-16 (×2): qty 1.5

## 2012-08-16 MED ORDER — LATANOPROST 0.005 % OP SOLN: 1.0000 [drp] | Freq: Every day | OPHTHALMIC | Status: DC

## 2012-08-16 MED ORDER — BRINZOLAMIDE 1 % OP SUSP: 1.0000 [drp] | Freq: Three times a day (TID) | OPHTHALMIC | Status: DC

## 2012-08-16 MED ORDER — FLUTICASONE-SALMETEROL 250-50 MCG/DOSE IN AEPB: 1.0000 | INHALATION_SPRAY | Freq: Two times a day (BID) | RESPIRATORY_TRACT | Status: DC

## 2012-08-16 MED ORDER — LISINOPRIL 2.5 MG PO TABS: 5.0000 mg | ORAL_TABLET | Freq: Every day | ORAL | Status: DC

## 2012-08-16 MED ORDER — INSULIN ASPART 100 UNIT/ML ~~LOC~~ SOLN
0.0000 [IU] | Freq: Three times a day (TID) | SUBCUTANEOUS | Status: DC
  Administered 2012-08-16: 15 [IU] via SUBCUTANEOUS
  Administered 2012-08-17: 11 [IU] via SUBCUTANEOUS
  Administered 2012-08-18: 4 [IU] via SUBCUTANEOUS

## 2012-08-16 MED ORDER — TRAMADOL HCL 50 MG PO TABS
50.0000 mg | ORAL_TABLET | Freq: Four times a day (QID) | ORAL | Status: DC | PRN
  Administered 2012-09-02 – 2012-09-11 (×2): 50 mg via ORAL
  Filled 2012-08-16 (×2): qty 1

## 2012-08-16 MED ORDER — ASPIRIN 325 MG PO TABS
325.0000 mg | ORAL_TABLET | Freq: Every day | ORAL | Status: DC
  Administered 2012-08-16 – 2012-09-24 (×38): 325 mg via ORAL
  Filled 2012-08-16 (×42): qty 1

## 2012-08-16 MED ORDER — FUROSEMIDE 40 MG PO TABS
40.0000 mg | ORAL_TABLET | Freq: Every day | ORAL | Status: DC
  Administered 2012-08-16 – 2012-09-24 (×38): 40 mg via ORAL
  Filled 2012-08-16 (×40): qty 1

## 2012-08-16 MED ORDER — PANTOPRAZOLE SODIUM 40 MG PO TBEC
40.0000 mg | DELAYED_RELEASE_TABLET | Freq: Every day | ORAL | Status: DC
  Administered 2012-08-16 – 2012-09-24 (×37): 40 mg via ORAL
  Filled 2012-08-16 (×23): qty 1
  Filled 2012-08-16: qty 2
  Filled 2012-08-16 (×10): qty 1

## 2012-08-16 MED ORDER — OXYCODONE HCL 5 MG PO TABS
10.0000 mg | ORAL_TABLET | ORAL | Status: DC | PRN
  Administered 2012-08-17 – 2012-09-23 (×79): 10 mg via ORAL
  Filled 2012-08-16 (×82): qty 2

## 2012-08-16 MED ORDER — POTASSIUM CHLORIDE CRYS ER 10 MEQ PO TBCR: 10.0000 meq | EXTENDED_RELEASE_TABLET | Freq: Every day | ORAL | Status: DC

## 2012-08-16 MED ORDER — INSULIN ASPART 100 UNIT/ML ~~LOC~~ SOLN
0.0000 [IU] | Freq: Every day | SUBCUTANEOUS | Status: DC
  Administered 2012-08-16 – 2012-08-17 (×2): 2 [IU] via SUBCUTANEOUS

## 2012-08-16 MED ORDER — VANCOMYCIN HCL IN DEXTROSE 1-5 GM/200ML-% IV SOLN
1000.0000 mg | Freq: Two times a day (BID) | INTRAVENOUS | Status: DC
  Administered 2012-08-16 – 2012-08-20 (×6): 1000 mg via INTRAVENOUS
  Filled 2012-08-16 (×8): qty 200

## 2012-08-16 MED ORDER — ACETAMINOPHEN 325 MG PO TABS
650.0000 mg | ORAL_TABLET | Freq: Four times a day (QID) | ORAL | Status: DC | PRN
  Filled 2012-08-16: qty 2

## 2012-08-16 NOTE — Progress Notes (Signed)
Patient ID: [name] [name], female   DOB: [birth year], 61 y.o.   MRN: [id number] .                    [address]            ,[place] [address]          [phone]       [name] [name] [facility] Medical Record [id number] Date of Birth: 28-Dec-1951  Referring: Lelon Perla, MD Primary Care: William Hamburger, MD  Chief Complaint:    Chief Complaint  Patient presents with  . Routine Post Op    2 week f/u for wound check, S/P CABG on 07/09/12    History of Present Illness:     61 year old obese poorly controlled diabetic with history of cocaine and tobacco abuse returns for wound check in the office. She had multivessel bypass grafting or unstable angina and critical coronary anatomy proximally one month ago She was seen in the office 10 days ago with a superficial sternal infection which was packed with a half-inch gauze and she was placed on oral antibiotics Keflex 3 times a day. She returns today with progression of the wound despite her oral antibiotics and superficial debridement of subcutaneous fat at the upper third of the incision. Because of her diabetes and poor healing she will be admitted for IV antibiotics and formal debridement and aperture with planned placement of a wound VAC to enhance healing. The sternal wound infection was previously cultured which showed a variety of organisms including Proteus and Klebsiella. The sternal wound is stable without the essence and there is no surrounding cellulitis   Current Activity/ Functional Status: Limited due to her obesity   Past Medical History  Diagnosis Date  . Hypertension   . Diabetes mellitus     diagnosed in 2008  . Glaucoma   . Hyperlipidemia   . CVA (cerebral infarction)     right internal capsule stroke in 12/2006  . Left-sided sensory deficit present   . Cocaine abuse     crack cocaine heavily until 2008 then sporatic use since then  . Tobacco abuse   . Thyroid nodule     FNA in [id number]  showed follicular cells but not definate neoplasm    Past Surgical History  Procedure Date  . Coronary artery bypass graft 07/09/2012    Procedure: CORONARY ARTERY BYPASS GRAFTING (CABG);  Surgeon: Ivin Poot, MD;  Location: [place];  Service: Open Heart Surgery;  Laterality: N/A;    History  Smoking status  . Current Everyday Smoker -- 1.0 packs/day  Smokeless tobacco  . Not on file   History  Alcohol Use No    History   Social History  . Marital Status: Single    Spouse Name: N/A    Number of Children: N/A  . Years of Education: N/A   Occupational History  . Not on file.   Social History Main Topics  . Smoking status: Current Everyday Smoker -- 1.0 packs/day  . Smokeless tobacco: Not on file  . Alcohol Use: No  . Drug Use: Yes    Special: Cocaine  . Sexually Active:    Other Topics Concern  . Not on file   Social History Narrative  . No narrative on file    No Known Allergies  Current Outpatient Prescriptions  Medication Sig Dispense Refill  . aspirin 325 MG tablet Take 325 mg by mouth daily.      Marland Kitchen  brinzolamide (AZOPT) 1 % ophthalmic suspension Place 1 drop into both eyes 3 (three) times daily.      . cetirizine (ZYRTEC) 10 MG tablet Take 10 mg by mouth daily.      . Fluticasone-Salmeterol (ADVAIR) 250-50 MCG/DOSE AEPB Inhale 1 puff into the lungs 2 (two) times daily.  60 each  0  . furosemide (LASIX) 40 MG tablet Take 40 mg by mouth daily.      Marland Kitchen glimepiride (AMARYL) 4 MG tablet Take 1 tablet (4 mg total) by mouth daily with breakfast.  30 tablet  1  . HYDROcodone-acetaminophen (NORCO/VICODIN) 5-325 MG per tablet Take 2 tablets by mouth every 4 (four) hours as needed for pain.  10 tablet  0  . latanoprost (XALATAN) 0.005 % ophthalmic solution Place 1 drop into both eyes at bedtime.      Marland Kitchen lisinopril (PRINIVIL,ZESTRIL) 5 MG tablet Take 1 tablet (5 mg total) by mouth daily.  30 tablet  1  . metFORMIN (GLUCOPHAGE) 500 MG tablet Take 1,000 mg by mouth 2  (two) times daily with a meal.      . metoprolol tartrate (LOPRESSOR) 25 MG tablet Take 0.5 tablets (12.5 mg total) by mouth 2 (two) times daily.  30 tablet  1  . potassium chloride SA (K-DUR,KLOR-CON) 20 MEQ tablet Take 1 tablet (20 mEq total) by mouth daily. For 14 days then stop.  14 tablet  0  . pravastatin (PRAVACHOL) 40 MG tablet Take 40 mg by mouth at bedtime.      . traMADol (ULTRAM) 50 MG tablet Take 1 tablet (50 mg total) by mouth every 4 (four) hours as needed for pain.  40 tablet  0  . DISCONTD: furosemide (LASIX) 40 MG tablet Take 1 tablet (40 mg total) by mouth daily. For 14 days then stop.  14 tablet  0   Current Facility-Administered Medications  Medication Dose Route Frequency Provider Last Rate Last Dose  . brinzolamide (AZOPT) 1 % ophthalmic suspension 1 drop  1 drop Both Eyes TID Ivin Poot, MD      . Fluticasone-Salmeterol (ADVAIR) 250-50 MCG/DOSE inhaler 1 puff  1 puff Inhalation BID Ivin Poot, MD      . glimepiride (AMARYL) tablet 4 mg  4 mg Oral Q breakfast Ivin Poot, MD      . latanoprost (XALATAN) 0.005 % ophthalmic solution 1 drop  1 drop Both Eyes QHS Ivin Poot, MD      . lisinopril (PRINIVIL,ZESTRIL) tablet 5 mg  5 mg Oral Daily Ivin Poot, MD      . loratadine (CLARITIN) tablet 10 mg  10 mg Oral Daily Ivin Poot, MD      . metoprolol tartrate (LOPRESSOR) tablet 25 mg  25 mg Oral BID Ivin Poot, MD      . potassium chloride (K-DUR,KLOR-CON) CR tablet 10 mEq  10 mEq Oral Daily Ivin Poot, MD         (Not in a hospital admission)  Family History  Problem Relation Age of Onset  . Other      no known family CAD     Review of Systems:     Cardiac Review of Systems: Y or N  Chest Pain [ y ]  Resting SOB [   ] Exertional SOB  [  y]  Orthopnea [  ]   Pedal Edema [   ]    Palpitations [  ] Syncope  [  ]   Presyncope [   ]  General Review of Systems: [Y] = yes [  ]=no Constitional: recent weight change [  ]; anorexia [   ]; fatigue [  ]; nausea [  ]; night sweats [  ]; fever [  ]; or chills [  ];                                                                                                                                          Dental: poor dentition[  ]; Last Dentist visit: Unknown  Eye : blurred vision [  ]; diplopia [   ]; vision changes [  ];  Amaurosis fugax[  ]; Resp: cough [  ];  wheezing[  ];  hemoptysis[  ]; shortness of breath[  ]; paroxysmal nocturnal dyspnea[  ]; dyspnea on exertion[  ]; or orthopnea[  ];  GI:  gallstones[  ], vomiting[  ];  dysphagia[  ]; melena[  ];  hematochezia [  ]; heartburn[  ];   Hx of  Colonoscopy[  ]; GU: kidney stones [  ]; hematuria[  ];   dysuria [  ];  nocturia[  ];  history of     obstruction [  ];             Skin: rash, swelling[  ];, hair loss[  ];  peripheral edema[  ];  or itching[  ]; Musculosketetal: myalgias[  ];  joint swelling[  ];  joint erythema[  ];  joint pain[  ];  back pain[  ];  Heme/Lymph: bruising[  ];  bleeding[  ];  anemia[  ];  Neuro: TIA[  ];  headaches[  ];  stroke[ n ];  vertigo[  ];  seizures[  ];   paresthesias[  ];  difficulty walking[  ];  Psych:depression[  ]; anxiety[  ];  Endocrine: diabetes[  ];  thyroid dysfunction[  ];  Immunizations: Flu [  ]; Pneumococcal[  ];  Other:  Physical Exam: BP 134/89  Pulse 104  Temp 97.5 F (36.4 C) (Oral)  Resp 20  Ht 5\' 9"  (1.753 m)  Wt 248 lb (112.492 kg)  BMI 36.62 kg/m2  SpO2 98%  Exam middle-aged obese female short of breath with exertion HEENT normocephalic Neck without JVD or tenderness Thorax with sternal incision with skin defect upper third sharply debrided of subcutaneous necrotic fat  --  breath sounds clear bilaterally last chest x-ray clear Cardiac regular rhythm without murmur or gallop Abdomen soft nontender Extremities leg incisions from saphenous vein harvesting healed minimal pedal edema Neuro intact no focal motor deficit   Diagnostic Studies & Laboratory data:       Recent Radiology Findings:   No results found.    Recent Lab Findings: Lab Results  Component Value Date   WBC 12.2* 08/11/2012   HGB 11.8* 08/11/2012   HCT 35.9* 08/11/2012   PLT 365 08/11/2012   GLUCOSE 297* 08/11/2012  CHOL 224* 06/30/2012   TRIG 227* 06/30/2012   HDL 25* 06/30/2012   LDLCALC 154* 06/30/2012   ALT 27 07/08/2012   AST 25 07/08/2012   NA 134* 08/11/2012   K 4.0 08/11/2012   CL 97 08/11/2012   CREATININE 1.06 08/11/2012   BUN 15 08/11/2012   CO2 24 08/11/2012   TSH 1.707 07/01/2012   INR 1.34 07/09/2012   HGBA1C 10.5* 06/29/2012      Assessment / Plan:      Admit for IV antibiotics and further wound debridement in the operating room with planned placement of a wound VAC Diabetic control with Lantus insulin plus sliding scale insulin hold metformin while inpatient and check A1c level      @me1 @ 08/16/2012 12:10 PM

## 2012-08-17 ENCOUNTER — Encounter (HOSPITAL_COMMUNITY): Payer: Self-pay | Admitting: Anesthesiology

## 2012-08-17 ENCOUNTER — Encounter (HOSPITAL_COMMUNITY)
Admission: AD | Disposition: A | Discharge status: Skilled Nursing Facility | Payer: Self-pay | Source: Ambulatory Visit | Attending: Cardiothoracic Surgery

## 2012-08-17 ENCOUNTER — Inpatient Hospital Stay (HOSPITAL_COMMUNITY): Payer: Medicaid Other | Admitting: Anesthesiology

## 2012-08-17 DIAGNOSIS — L02219 Cutaneous abscess of trunk, unspecified: Secondary | ICD-10-CM

## 2012-08-17 DIAGNOSIS — L03319 Cellulitis of trunk, unspecified: Secondary | ICD-10-CM

## 2012-08-17 HISTORY — PX: STERNAL WOUND DEBRIDEMENT: SHX1058

## 2012-08-17 LAB — GLUCOSE, CAPILLARY
Glucose-Capillary: 268 mg/dL — ABNORMAL HIGH (ref 70–99)
Glucose-Capillary: 243 mg/dL — ABNORMAL HIGH (ref 70–99)
Glucose-Capillary: 215 mg/dL — ABNORMAL HIGH (ref 70–99)
Glucose-Capillary: 295 mg/dL — ABNORMAL HIGH (ref 70–99)
Glucose-Capillary: 222 mg/dL — ABNORMAL HIGH (ref 70–99)

## 2012-08-17 LAB — URINALYSIS, ROUTINE W REFLEX MICROSCOPIC
Bilirubin Urine: NEGATIVE
Glucose, UA: NEGATIVE mg/dL
Hgb urine dipstick: NEGATIVE
Ketones, ur: NEGATIVE mg/dL
Nitrite: NEGATIVE
Protein, ur: NEGATIVE mg/dL
Specific Gravity, Urine: 1.016 (ref 1.005–1.030)
Urobilinogen, UA: 1 mg/dL (ref 0.0–1.0)
pH: 5 (ref 5.0–8.0)

## 2012-08-17 LAB — URINE MICROSCOPIC-ADD ON

## 2012-08-17 LAB — SURGICAL PCR SCREEN
MRSA, PCR: NEGATIVE
Staphylococcus aureus: NEGATIVE

## 2012-08-17 SURGERY — DEBRIDEMENT, WOUND, STERNUM
Anesthesia: Monitor Anesthesia Care | Site: Chest

## 2012-08-17 MED ORDER — FENTANYL CITRATE 0.05 MG/ML IJ SOLN
INTRAMUSCULAR | Status: DC | PRN
  Administered 2012-08-17: 50 ug via INTRAVENOUS

## 2012-08-17 MED ORDER — PROPOFOL 10 MG/ML IV EMUL
INTRAVENOUS | Status: DC | PRN
  Administered 2012-08-17: 50 ug/kg/min via INTRAVENOUS

## 2012-08-17 MED ORDER — CHLORHEXIDINE GLUCONATE 4 % EX LIQD
60.0000 mL | Freq: Once | CUTANEOUS | Status: AC
  Administered 2012-08-17: 4 via TOPICAL
  Filled 2012-08-17: qty 60

## 2012-08-17 MED ORDER — ONDANSETRON HCL 4 MG/2ML IJ SOLN: 4.0000 mg | Freq: Four times a day (QID) | INTRAMUSCULAR | Status: DC | PRN

## 2012-08-17 MED ORDER — LIDOCAINE HCL (PF) 0.5 % IJ SOLN
INTRAMUSCULAR | Status: DC | PRN
  Administered 2012-08-17: 50 mL via INTRADERMAL

## 2012-08-17 MED ORDER — LACTATED RINGERS IV SOLN: INTRAVENOUS | Status: DC

## 2012-08-17 MED ORDER — SODIUM CHLORIDE 0.9 % IJ SOLN
10.0000 mL | Freq: Two times a day (BID) | INTRAMUSCULAR | Status: DC
  Administered 2012-08-17 – 2012-08-27 (×5): 10 mL
  Administered 2012-09-05: 20 mL
  Administered 2012-09-17 – 2012-09-19 (×2): 10 mL

## 2012-08-17 MED ORDER — SODIUM CHLORIDE 0.9 % IJ SOLN
10.0000 mL | INTRAMUSCULAR | Status: DC | PRN
  Administered 2012-08-17 – 2012-09-18 (×11): 10 mL

## 2012-08-17 MED ORDER — MIDAZOLAM HCL 5 MG/5ML IJ SOLN
INTRAMUSCULAR | Status: DC | PRN
  Administered 2012-08-17: 1 mg via INTRAVENOUS

## 2012-08-17 MED ORDER — LIDOCAINE HCL (PF) 0.5 % IJ SOLN
INTRAMUSCULAR | Status: AC
  Filled 2012-08-17: qty 50

## 2012-08-17 MED ORDER — LACTATED RINGERS IV SOLN
INTRAVENOUS | Status: DC | PRN
  Administered 2012-08-17: 07:00:00 via INTRAVENOUS

## 2012-08-17 MED ORDER — ONDANSETRON HCL 4 MG/2ML IJ SOLN
INTRAMUSCULAR | Status: DC | PRN
  Administered 2012-08-17: 4 mg via INTRAVENOUS

## 2012-08-17 MED ORDER — HYDROMORPHONE HCL PF 1 MG/ML IJ SOLN: 0.2500 mg | INTRAMUSCULAR | Status: DC | PRN

## 2012-08-17 MED ORDER — SODIUM CHLORIDE 0.9 % IR SOLN
Status: DC | PRN
  Administered 2012-08-17: 3000 mL

## 2012-08-17 MED ORDER — PHENYLEPHRINE HCL 10 MG/ML IJ SOLN
10.0000 mg | INTRAVENOUS | Status: DC | PRN
  Administered 2012-08-17: 10 ug/min via INTRAVENOUS

## 2012-08-17 MED ORDER — SODIUM CHLORIDE 0.9 % IR SOLN
Status: DC | PRN
  Administered 2012-08-17: 09:00:00

## 2012-08-17 MED ORDER — INSULIN GLARGINE 100 UNIT/ML ~~LOC~~ SOLN
20.0000 [IU] | Freq: Two times a day (BID) | SUBCUTANEOUS | Status: DC
  Administered 2012-08-17: 20 [IU] via SUBCUTANEOUS

## 2012-08-17 SURGICAL SUPPLY — 62 items
APL SKNCLS STERI-STRIP NONHPOA (GAUZE/BANDAGES/DRESSINGS)
ATTRACTOMAT 16X20 MAGNETIC DRP (DRAPES) ×3 IMPLANT
BAG DECANTER FOR FLEXI CONT (MISCELLANEOUS) ×3 IMPLANT
BAG URIMETER 350ML BARDEX IC (UROLOGICAL SUPPLIES)
BAG URIMETER BARDEX IC 350 (UROLOGICAL SUPPLIES) IMPLANT
BANDAGE GAUZE ELAST BULKY 4 IN (GAUZE/BANDAGES/DRESSINGS) IMPLANT
BENZOIN TINCTURE PRP APPL 2/3 (GAUZE/BANDAGES/DRESSINGS) IMPLANT
BLADE SURG 10 STRL SS (BLADE) ×5 IMPLANT
BLADE SURG 15 STRL LF DISP TIS (BLADE) IMPLANT
BLADE SURG 15 STRL SS (BLADE) ×3
CANISTER SUCTION 2500CC (MISCELLANEOUS) ×3 IMPLANT
CATH FOLEY 2WAY SLVR  5CC 16FR (CATHETERS)
CATH FOLEY 2WAY SLVR 5CC 16FR (CATHETERS) IMPLANT
CATH THORACIC 28FR RT ANG (CATHETERS) IMPLANT
CATH THORACIC 36FR (CATHETERS) IMPLANT
CATH THORACIC 36FR RT ANG (CATHETERS) IMPLANT
CLIP TI WIDE RED SMALL 24 (CLIP) IMPLANT
CLOTH BEACON ORANGE TIMEOUT ST (SAFETY) ×3 IMPLANT
CONN Y 3/8X3/8X3/8  BEN (MISCELLANEOUS)
CONN Y 3/8X3/8X3/8 BEN (MISCELLANEOUS) IMPLANT
CONT SPEC 4OZ CLIKSEAL STRL BL (MISCELLANEOUS) IMPLANT
COVER SURGICAL LIGHT HANDLE (MISCELLANEOUS) ×6 IMPLANT
DRAPE LAPAROSCOPIC ABDOMINAL (DRAPES) ×3 IMPLANT
DRAPE SLUSH/WARMER DISC (DRAPES) ×2 IMPLANT
DRAPE WARM FLUID 44X44 (DRAPE) IMPLANT
DRSG PAD ABDOMINAL 8X10 ST (GAUZE/BANDAGES/DRESSINGS) IMPLANT
DRSG VAC ATS SM SENSATRAC (GAUZE/BANDAGES/DRESSINGS) ×2 IMPLANT
ELECT REM PT RETURN 9FT ADLT (ELECTROSURGICAL) ×3
ELECTRODE REM PT RTRN 9FT ADLT (ELECTROSURGICAL) ×1 IMPLANT
GAUZE XEROFORM 5X9 LF (GAUZE/BANDAGES/DRESSINGS) IMPLANT
GLOVE BIO SURGEON STRL SZ7.5 (GLOVE) ×6 IMPLANT
GOWN STRL NON-REIN LRG LVL3 (GOWN DISPOSABLE) ×12 IMPLANT
HANDPIECE INTERPULSE COAX TIP (DISPOSABLE) ×3
HEMOSTAT POWDER SURGIFOAM 1G (HEMOSTASIS) IMPLANT
HEMOSTAT SURGICEL 2X14 (HEMOSTASIS) IMPLANT
KIT BASIN OR (CUSTOM PROCEDURE TRAY) ×3 IMPLANT
KIT ROOM TURNOVER OR (KITS) ×3 IMPLANT
KIT SUCTION CATH 14FR (SUCTIONS) IMPLANT
NS IRRIG 1000ML POUR BTL (IV SOLUTION) ×3 IMPLANT
PACK CHEST (CUSTOM PROCEDURE TRAY) ×3 IMPLANT
PAD ARMBOARD 7.5X6 YLW CONV (MISCELLANEOUS) ×8 IMPLANT
SET HNDPC FAN SPRY TIP SCT (DISPOSABLE) IMPLANT
SOLUTION BETADINE 4OZ (MISCELLANEOUS) IMPLANT
SPONGE GAUZE 4X4 12PLY (GAUZE/BANDAGES/DRESSINGS) ×3 IMPLANT
SPONGE LAP 18X18 X RAY DECT (DISPOSABLE) ×3 IMPLANT
STAPLER VISISTAT 35W (STAPLE) IMPLANT
STRAP MONTGOMERY 1.25X11-1/8 (MISCELLANEOUS) IMPLANT
SUT ETHILON 3 0 FSL (SUTURE) IMPLANT
SUT STEEL 6MS V (SUTURE) IMPLANT
SUT STEEL STERNAL CCS#1 18IN (SUTURE) IMPLANT
SUT STEEL SZ 6 DBL 3X14 BALL (SUTURE) IMPLANT
SUT VIC AB 1 CTX 36 (SUTURE) ×6
SUT VIC AB 1 CTX36XBRD ANBCTR (SUTURE) ×2 IMPLANT
SUT VIC AB 2-0 CTX 27 (SUTURE) ×6 IMPLANT
SUT VIC AB 3-0 X1 27 (SUTURE) ×6 IMPLANT
SWAB COLLECTION DEVICE MRSA (MISCELLANEOUS) ×2 IMPLANT
SWAB CULTURE LIQUID MINI MALE (MISCELLANEOUS) IMPLANT
SYR 5ML LL (SYRINGE) IMPLANT
TOWEL OR 17X24 6PK STRL BLUE (TOWEL DISPOSABLE) ×3 IMPLANT
TOWEL OR 17X26 10 PK STRL BLUE (TOWEL DISPOSABLE) ×3 IMPLANT
TUBE ANAEROBIC SPECIMEN COL (MISCELLANEOUS) IMPLANT
WATER STERILE IRR 1000ML POUR (IV SOLUTION) ×3 IMPLANT

## 2012-08-17 NOTE — Brief Op Note (Signed)
08/16/2012 - 08/17/2012  8:43 AM  PATIENT:  [name] [name]  61 y.o. female  PRE-OPERATIVE DIAGNOSIS: Superficial sternal wound infection  POST-OPERATIVE DIAGNOSIS: Superficial sternal wound infection  PROCEDURE:  Procedure(s) (LRB): STERNAL WOUND DEBRIDEMENT and placement of wound VAC  SURGEON:  Surgeon(s) and Role:    * Ivin Poot, MD - Primary  PHYSICIAN ASSISTANT:0  ASSISTANTS: 0  ANESTHESIA:   MAC, local .5% lidocaine  EBL:  Total I/O In: 600 [I.V.:600] Out: -   BLOOD ADMINISTERED: None  DRAINS: Wound VAC   LOCAL MEDICATIONS USED:  10 cc lidocaine  SPECIMEN:  None  DISPOSITION OF SPECIMEN:  Wound culture to microbiology  COUNTS:  Correct  TOURNIQUET:   DICTATION: .  PLAN OF CARE: Admit to inpatient   PATIENT DISPOSITION:  PACU - hemodynamically stable.   Delay start of Pharmacological VTE agent (>24hrs) due to surgical blood loss or risk of bleeding

## 2012-08-17 NOTE — Care Management Note (Unsigned)
Page 1 of 2   09/23/2012     1:26:55 PM   CARE MANAGEMENT NOTE 09/23/2012  Patient:  [name] [name], [name] [name]   Account Number:  [id number]  Date Initiated:  08/17/2012  Documentation initiated by:  SIMMONS,Anzley Dibbern  Subjective/Objective Assessment:   ADMITTED WITH STERNAL WOUND INFECTION; LIVES AT HOME WITH BOYFRIEND; WAS IPTA; [place].     Action/Plan:   DISCHARGE PLANNING INITIATED;   Anticipated DC Date:  08/20/2012   Anticipated DC Plan:  SKILLED NURSING FACILITY  In-house referral  Clinical Social Worker      DC Planning Services  CM consult      Choice offered to / List presented to:             Status of service:  In process, will continue to follow Medicare Important Message given?   (If response is "NO", the following Medicare IM given date fields will be blank) Date Medicare IM given:   Date Additional Medicare IM given:    Discharge Disposition:    Per UR Regulation:  Reviewed for med. necessity/level of care/duration of stay  If discussed at [place] of Stay Meetings, dates discussed:   08/24/2012  08/26/2012  09/07/2012  09/09/2012  09/14/2012  09/16/2012  09/21/2012  09/23/2012    Comments:  09/23/12  1323  [place], BSN [phone] Plan for discharge tomorrow - Follow up at the wound care center. No VAC changes between no and then;  HOWEVER, SNF HAS DECLINED ACCEPTING PT DUE TO LACK OF INSURANCE; CSW HAS RE-FAXED INFORMATION OUT; MD NOTIIFED; VAC FORMS PLACED ON CHART FOR MD SIGNATURE IN CASE SHE IS TO D/C HOME WITH VAC; NCM WILL FOLLOW.  09/20/12  [place], BSN [phone] S/P I AND D TO STERNAL WOUND WITH VAC CHANGE TODAY;  NEXT VAC CHANGE WILL BE THURSDAY BY MD.  09/16/12  [place], BSN [phone] [facility] CHANGES M-W-F; IV ABX CONTINUE; PLASTICS REC I AND D ON MONDAY 09/20/12; NCM WILL FOLLOW.  09/13/12  [place], BSN [phone] STABLE WITH CURRENT VAC CHANGES; IV ABX TO BE CONVERTED TO PO;  DISCHARGE PENDING DR VAN TRIGT; NCM WILL FOLLOW.  09/08/12  [place], BSN [phone] Stable on current rx/tx, VAC changes as scheduled. PER DR VAN TRIGT, ANTICIPATED D/C WON'T BE FOR ANOTHER WEEK OR TWO;  NCM WILL FOLLOW.   09/06/12  [place], BSN [phone] STILL WITH VAC CHANGES M-W-F AND IV ABX THERRAPY; FOR VAC CHANGE TODAY- DR VAN TRIGT TO SEE.  08/31/12  [place], BSN [phone] Dr Lucianne Lei Tright in to remove vac earlier and assess wound. Requested to re-apply dressing.  Mepitel applied, generous amt hydrogel, and one piece black sponge at 38mm cont suction.  Pt medicated prior to procedure and tolerated with minimal discomfort.  Inner wound bed appears beefy red with Acell visible.  Pt states Dr Lucianne Lei Tright plans to take her back to the OR tomorrow to debride inner wound bed further.  Will resume vac dressing changes when requested after surgery.    08/25/12  [place], BSN [phone] PT HAD 4TH I AND D IN OR YESTERDAY 08/24/12; MD TO CHECK WOUND ON 08/26/12 AND 08/28/12 IN HOSPITAL PRIOR TO D/C; IV ABX CONTINUE; HOPEFUL FOR POSSIBLE D/C TO SNF ON MONDAY 08/30/12.   08/17/12  1502  [name] [name] SIMMONS RN, BSN [name]  WITH PT AT BEDSIDE; SHE IS IN AGREEMENT TO D/C TO SNF WHEN MEDICALLY STABLE; CSW NOTIFIED. NCM WILL FOLLOW.    08/17/12  [place], BSN [phone] BEST DISCHARGE PLAN FOR PT WOULD BE SNF AS SHE HAS DEFINITE HISTORY OF SUBSTANCE ABUSE AND D/C TO HOME WITH A PICC WOULD BE ILL-ADVISED; NCM WILL FOLLOW.

## 2012-08-17 NOTE — Progress Notes (Signed)
Peripherally Inserted Central Catheter/Midline Placement  The IV Nurse has discussed with the patient and/or persons authorized to consent for the patient, the purpose of this procedure and the potential benefits and risks involved with this procedure.  The benefits include less needle sticks, lab draws from the catheter and patient may be discharged home with the catheter.  Risks include, but not limited to, infection, bleeding, blood clot (thrombus formation), and puncture of an artery; nerve damage and irregular heat beat.  Alternatives to this procedure were also discussed.  PICC/Midline Placement Documentation        [name] [name] 08/17/2012, 10:48 AM Consent obtained by Claretha Cooper, RN  IV Team

## 2012-08-17 NOTE — Anesthesia Preprocedure Evaluation (Addendum)
Anesthesia Evaluation  Patient identified by MRN, date of birth, ID band Patient awake    Reviewed: Allergy & Precautions, H&P , NPO status , Patient's Chart, lab work & pertinent test results, reviewed documented beta blocker date and time   Airway Mallampati: II TM Distance: >3 FB Neck ROM: Full    Dental  (+) Edentulous Upper and Edentulous Lower   Pulmonary Current Smoker,    Pulmonary exam normal       Cardiovascular hypertension, Pt. on medications and Pt. on home beta blockers + angina + CAD, + Past MI and + CABG     Neuro/Psych CVA, Residual Symptoms    GI/Hepatic   Endo/Other  Type 2, Oral Hypoglycemic Agentsobese  Renal/GU      Musculoskeletal   Abdominal Normal abdominal exam  (+)   Peds  Hematology   Anesthesia Other Findings   Reproductive/Obstetrics                          Anesthesia Physical Anesthesia Plan  ASA: III  Anesthesia Plan: General   Post-op Pain Management:    Induction: Intravenous  Airway Management Planned: Oral ETT  Additional Equipment:   Intra-op Plan:   Post-operative Plan: Extubation in OR  Informed Consent: I have reviewed the patients History and Physical, chart, labs and discussed the procedure including the risks, benefits and alternatives for the proposed anesthesia with the patient or authorized representative who has indicated his/her understanding and acceptance.   Dental advisory given  Plan Discussed with: CRNA, Anesthesiologist and Surgeon  Anesthesia Plan Comments:         Anesthesia Quick Evaluation

## 2012-08-17 NOTE — Progress Notes (Signed)
Wound vac to chest at 68mm/mg

## 2012-08-17 NOTE — Addendum Note (Signed)
Addendum  created 08/17/12 1057 by Sherilyn Banker, CRNA   Modules edited:Charges VN

## 2012-08-17 NOTE — Progress Notes (Signed)
OR transport Melene Plan, came to pick up patient for surgery. Nurse informs him that patient still has Zosyn running at 12.70ml/hr, and asks him if I am to stop the Zosyn. Morton responded that nurse should call OR. Nurse calls OR and Boniface informs nurse that he can turn off Zosyn before transport to surgery. Nurse sends Zinacef down with patient to OR. Fara Boros

## 2012-08-17 NOTE — Progress Notes (Signed)
Superficial sternal wound debridement was performed today in the operating room under anesthesia. There is no purulence but there was evidence of fat necrosis above the sternum. The sternum appears to be healing. There are no deep sinus tracts. One sternal wire is removed. A small wound VAC sponge was placed.  Plan will be for wound VAC change on Wednesday and Friday of this week. A PICC line will be placed for home IV antibiotics A previous wound culture in the clinic return positive for Klebsiella and Morganella sensitive to several antibiotics including Ancef.   Cultures were submitted. For now we'll continue vancomycin and Zosyn until further identification sensitivity is obtained to determine home antibiotic choice.  Diabetes is been poorly controlled with blood sugar up to 300 and Lantus insulin was increased to twice a day 20 units

## 2012-08-17 NOTE — Preoperative (Signed)
Beta Blockers   Reason not to administer Beta Blockers:Not Applicable 

## 2012-08-17 NOTE — Anesthesia Postprocedure Evaluation (Signed)
Anesthesia Post Note  Patient: [name] [name]  Procedure(s) Performed: Procedure(s) (LRB): STERNAL WOUND DEBRIDEMENT (N/A)  Anesthesia type: MAC  Patient location: PACU  Post pain: Pain level controlled and Adequate analgesia  Post assessment: Post-op Vital signs reviewed, Patient's Cardiovascular Status Stable and Respiratory Function Stable  Last Vitals:  Filed Vitals:   08/17/12 0930  BP:   Pulse:   Temp: 36.5 C  Resp:     Post vital signs: Reviewed and stable  Level of consciousness: awake, alert  and oriented  Complications: No apparent anesthesia complications

## 2012-08-17 NOTE — Transfer of Care (Signed)
Immediate Anesthesia Transfer of Care Note  Patient: [name] [name]  Procedure(s) Performed: Procedure(s) (LRB): STERNAL WOUND DEBRIDEMENT (N/A)  Patient Location: PACU  Anesthesia Type: General  Level of Consciousness: awake, alert  and oriented  Airway & Oxygen Therapy: Patient Spontanous Breathing and Patient connected to face mask oxygen  Post-op Assessment: Report given to PACU RN  Post vital signs: Reviewed  Complications: No apparent anesthesia complications

## 2012-08-18 ENCOUNTER — Encounter (HOSPITAL_COMMUNITY): Payer: Self-pay | Admitting: Cardiothoracic Surgery

## 2012-08-18 LAB — GLUCOSE, CAPILLARY
Glucose-Capillary: 194 mg/dL — ABNORMAL HIGH (ref 70–99)
Glucose-Capillary: 206 mg/dL — ABNORMAL HIGH (ref 70–99)
Glucose-Capillary: 133 mg/dL — ABNORMAL HIGH (ref 70–99)

## 2012-08-18 LAB — CBC
HCT: 33.3 % — ABNORMAL LOW (ref 36.0–46.0)
Hemoglobin: 10.4 g/dL — ABNORMAL LOW (ref 12.0–15.0)
MCH: 24.9 pg — ABNORMAL LOW (ref 26.0–34.0)
MCHC: 31.2 g/dL (ref 30.0–36.0)
MCV: 79.9 fL (ref 78.0–100.0)
Platelets: 458 10*3/uL — ABNORMAL HIGH (ref 150–400)
RBC: 4.17 MIL/uL (ref 3.87–5.11)
RDW: 17.8 % — ABNORMAL HIGH (ref 11.5–15.5)
WBC: 7.1 10*3/uL (ref 4.0–10.5)

## 2012-08-18 LAB — BASIC METABOLIC PANEL
BUN: 14 mg/dL (ref 6–23)
CO2: 30 mEq/L (ref 19–32)
Calcium: 9.8 mg/dL (ref 8.4–10.5)
Chloride: 100 mEq/L (ref 96–112)
Creatinine, Ser: 0.86 mg/dL (ref 0.50–1.10)
GFR calc Af Amer: 83 mL/min — ABNORMAL LOW (ref >90)
GFR calc non Af Amer: 71 mL/min — ABNORMAL LOW (ref >90)
Glucose, Bld: 216 mg/dL — ABNORMAL HIGH (ref 70–99)
Potassium: 4 mEq/L (ref 3.5–5.1)
Sodium: 139 mEq/L (ref 135–145)

## 2012-08-18 MED ORDER — METFORMIN HCL 500 MG PO TABS
1000.0000 mg | ORAL_TABLET | Freq: Two times a day (BID) | ORAL | Status: DC
  Administered 2012-08-18 – 2012-09-24 (×71): 1000 mg via ORAL
  Filled 2012-08-18 (×77): qty 2

## 2012-08-18 MED ORDER — INSULIN ASPART 100 UNIT/ML ~~LOC~~ SOLN
0.0000 [IU] | Freq: Every day | SUBCUTANEOUS | Status: DC
  Administered 2012-08-20 – 2012-09-22 (×2): 2 [IU] via SUBCUTANEOUS

## 2012-08-18 MED ORDER — INSULIN ASPART 100 UNIT/ML ~~LOC~~ SOLN
0.0000 [IU] | Freq: Three times a day (TID) | SUBCUTANEOUS | Status: DC
  Administered 2012-08-19 (×2): 4 [IU] via SUBCUTANEOUS
  Administered 2012-08-20: 3 [IU] via SUBCUTANEOUS
  Administered 2012-08-20: 4 [IU] via SUBCUTANEOUS
  Administered 2012-08-20: 3 [IU] via SUBCUTANEOUS
  Administered 2012-08-21: 4 [IU] via SUBCUTANEOUS
  Administered 2012-08-22 (×2): 3 [IU] via SUBCUTANEOUS
  Administered 2012-08-23 – 2012-08-25 (×3): 4 [IU] via SUBCUTANEOUS
  Administered 2012-08-26: 3 [IU] via SUBCUTANEOUS
  Administered 2012-08-27: 4 [IU] via SUBCUTANEOUS
  Administered 2012-08-29: 3 [IU] via SUBCUTANEOUS
  Administered 2012-09-04: 4 [IU] via SUBCUTANEOUS
  Administered 2012-09-06: 3 [IU] via SUBCUTANEOUS
  Administered 2012-09-06: 4 [IU] via SUBCUTANEOUS
  Administered 2012-09-08: 3 [IU] via SUBCUTANEOUS
  Administered 2012-09-11 – 2012-09-12 (×2): 4 [IU] via SUBCUTANEOUS
  Administered 2012-09-12 – 2012-09-14 (×2): 3 [IU] via SUBCUTANEOUS

## 2012-08-18 MED ORDER — SIMVASTATIN 20 MG PO TABS
20.0000 mg | ORAL_TABLET | Freq: Every day | ORAL | Status: DC
  Administered 2012-08-18 – 2012-09-23 (×36): 20 mg via ORAL
  Filled 2012-08-18 (×41): qty 1

## 2012-08-18 MED ORDER — INSULIN GLARGINE 100 UNIT/ML ~~LOC~~ SOLN: 28.0000 [IU] | Freq: Every day | SUBCUTANEOUS | Status: DC

## 2012-08-18 MED ORDER — GLIMEPIRIDE 4 MG PO TABS
4.0000 mg | ORAL_TABLET | Freq: Every day | ORAL | Status: DC
  Administered 2012-08-19 – 2012-09-22 (×33): 4 mg via ORAL
  Filled 2012-08-18 (×42): qty 1

## 2012-08-18 MED ORDER — METOPROLOL TARTRATE 25 MG PO TABS
25.0000 mg | ORAL_TABLET | Freq: Two times a day (BID) | ORAL | Status: DC
  Administered 2012-08-18 – 2012-09-24 (×74): 25 mg via ORAL
  Filled 2012-08-18 (×77): qty 1

## 2012-08-18 MED ORDER — INSULIN ASPART 100 UNIT/ML ~~LOC~~ SOLN
4.0000 [IU] | Freq: Three times a day (TID) | SUBCUTANEOUS | Status: DC
  Administered 2012-08-19 – 2012-09-10 (×27): 4 [IU] via SUBCUTANEOUS

## 2012-08-18 MED ORDER — INSULIN ASPART 100 UNIT/ML ~~LOC~~ SOLN
0.0000 [IU] | Freq: Three times a day (TID) | SUBCUTANEOUS | Status: DC
  Administered 2012-08-18: 5 [IU] via SUBCUTANEOUS

## 2012-08-18 NOTE — Op Note (Addendum)
[name] [name], ANDRADE NO.:  [id number]  MEDICAL RECORD NO.:  [id number]  LOCATION:  2035                         FACILITY:  [facility]  PHYSICIAN:  Ivin Poot, M.D.  DATE OF BIRTH:  1951/08/21  DATE OF PROCEDURE:  08/17/2012 DATE OF DISCHARGE:                              OPERATIVE REPORT   OPERATION: 1. Superficial sternal wound debridement. 2. Placement of wound VAC.  PREOPERATIVE DIAGNOSIS:  Superficial sternal wound infection, status post urgent coronary artery bypass graft on July 09, 2012.  POSTOPERATIVE DIAGNOSIS:  Superficial sternal wound infection, status post urgent coronary artery bypass graft on July 09, 2012.  SURGEON:  Ivin Poot, MD  ANESTHESIA:  MAC with local 1% lidocaine.  INDICATIONS FOR PROCEDURE:  The patient is a 61 year old poorly controlled, obese, diabetic, who underwent urgent multivessel bypass grafting in mid July for unstable postinfarction angina.  She initially did well, but returned to the office with areas of superficial skin necrosis with some drainage.  This was cultured for Gram-negative rods including Klebsiella and Morganella and she was placed on antibiotics and local wound care.  On her next office followup, there had been no improvement and although, there was no surrounding cellulitis, it was decided to admit the patient for formal debridement and IV antibiotics. I discussed the procedure with the patient of superficial sternal debridement and wound VAC placement including the use of anesthesia, expected postop recovery, and the need for a wound VAC for the next 2-3 weeks expected.  PROCEDURE:  The patient was brought to the operating room and placed in supine on the operating room table.  A proper time-out was performed. The chest was prepped and draped as a sterile field.  Intravenous conscious sedation under monitoring was provided by Anesthesia.  A 1% lidocaine was infiltrated into the wound, 10 mL  total.  There was superficial necrosis of the subcutaneous fat, which was fairly deep in this obese patient.  There was no sinus tracts or undermining of the wound.  The sternum appeared to be intact and there was no evidence of deep sinus tract.  Sharp debridement was performed.  This was down to the sternum.  No sternum was debrided.  One exposed sternal wire was removed.  The wound was irrigated with the pulse lavage of 800 mL of sterile fluid including some antibiotic irrigation.  Next, a small wound VAC sponge was placed in the defect, covered with the sterile Vi-Drape and connected to 100 mm of suction.  The patient returned to the recovery room in stable condition.  Blood loss was minimal.  Excisional debridement of full thickness soft tissue to the sternum was completed from skin to chest wall muscle   Ivin Poot, M.D.     PV/MEDQ  D:  08/17/2012  T:  08/18/2012  Job:  [id number]

## 2012-08-18 NOTE — ED Notes (Signed)
Chart returned from [place] office . Patient on Keflex per [facility].

## 2012-08-18 NOTE — Progress Notes (Addendum)
1 Day Post-Op Procedure(s) (LRB): STERNAL WOUND DEBRIDEMENT (N/A)  Subjective: Patient without complaints this am.  Objective: Vital signs in last 24 hours: Patient Vitals for the past 24 hrs:  BP Temp Temp src Pulse Resp SpO2 Weight  08/18/12 0416 - - - - - - 254 lb 3.1 oz (115.3 kg)  08/18/12 0339 124/80 mmHg 99.1 F (37.3 C) Oral 89  18  98 % -  08/17/12 2008 - 97.6 F (36.4 C) Oral 89  18  - -  08/17/12 1951 122/82 mmHg 98.7 F (37.1 C) Oral 82  18  98 % -  08/17/12 1339 130/84 mmHg 98.1 F (36.7 C) Oral 79  18  100 % -  08/17/12 0958 132/85 mmHg 98 F (36.7 C) Oral 84  19  99 % -  08/17/12 0930 - 97.7 F (36.5 C) - - - - -  08/17/12 0915 150/87 mmHg - - 83  22  100 % -  08/17/12 0900 102/48 mmHg - - 85  22  100 % -  08/17/12 0845 131/82 mmHg 96.8 F (36 C) - 77  24  100 % -   Current Weight  08/18/12 254 lb 3.1 oz (115.3 kg)      Intake/Output from previous day: 08/20 0701 - 08/21 0700 In: 1090 [P.O.:480; I.V.:610] Out: 25 [Blood:25]   Physical Exam:  Cardiovascular: RRR. Pulmonary: Clear to auscultation bilaterally; no rales, wheezes, or rhonchi. Abdomen: Soft, non tender, bowel sounds present. Extremities: Mild bilateral lower extremity edema. Wound: VAC in place.  Lab Results: CBC: Basename 08/18/12 0500 08/16/12 1327  WBC 7.1 7.2  HGB 10.4* 10.5*  HCT 33.3* 32.6*  PLT 458* 425*   BMET:  Basename 08/18/12 0500 08/16/12 1327  NA 139 138  K 4.0 4.2  CL 100 101  CO2 30 28  GLUCOSE 216* 274*  BUN 14 14  CREATININE 0.86 0.90  CALCIUM 9.8 10.0    PT/INR:  Lab Results  Component Value Date   INR 1.12 08/16/2012   INR 1.34 07/09/2012   INR 1.15 06/29/2012   ABG:  INR: Will add last result for INR, ABG once components are confirmed Will add last 4 CBG results once components are confirmed  Assessment/Plan:  1. CV - SR.Will restart Lopressor as taken pre op. 2.Superficial sternal wound infection-continue VAC and antibiotics (Zosyn and  Vancomycin). 3.DM-CBGs 295/222/194.Restart Amaryl and Metformin. Continue Insulin PRN.  ZIMMERMAN,DONIELLE MPA-C 08/18/2012   cont current vanc/zosyn pending culture ID Chg VAC [id number]  By wound care RN  patient examined and medical record reviewed,agree with above note. [name],PETER 08/18/2012   _

## 2012-08-18 NOTE — Progress Notes (Signed)
Inpatient Diabetes Program Recommendations  AACE/ADA: New Consensus Statement on Inpatient Glycemic Control (2013)  Target Ranges:  Prepandial:   less than 140 mg/dL      Peak postprandial:   less than 180 mg/dL (1-2 hours)      Critically ill patients:  140 - 180 mg/dL   Reason for Visit: Results for JALEIA, BERENGUER (MRN [id number]) as of 08/18/2012 10:24  Ref. Range 08/17/2012 08:55 08/17/2012 11:46 08/17/2012 16:01 08/17/2012 20:56 08/18/2012 05:20  Glucose-Capillary Latest Range: 70-99 mg/dL 243 (H) 215 (H) 295 (H) 222 (H) 194 (H)   CBG's greater than goal.  Note oral agents resumed this morning and Lantus discontinued.  Note patient received 20 units of Lantus last PM and fasting glucose this morning was 216 mg/dL.  Please resume Lantus while in hospital.  Consider restarting Lantus 28 units daily.  Also, consider adding Novolog meal coverage 4 units tid with meals.  Note A1C=7.2% indicating okay control over the past 2-3 months, however it appears CBG's increased with infection.  May need insulin short term after discharge while wound is healing.    Note: Will follow.

## 2012-08-18 NOTE — Clinical Social Work Psychosocial (Signed)
Clinical Social Work Department BRIEF PSYCHOSOCIAL ASSESSMENT 08/18/2012  Patient:  [name] [name], [name] [name]     Account Number:  [id number]     Admit date:  08/16/2012  Clinical Social Worker:  Wylene Men  Date/Time:  08/18/2012 04:10 PM  Referred by:  RN  Date Referred:  08/18/2012 Referred for  SNF Placement   Other Referral:   none   Interview type:  Patient Other interview type:   none    PSYCHOSOCIAL DATA Living Status:  ALONE Admitted from facility:   Level of care:   Primary support name:  [name] [name] Primary support relationship to patient:  FRIEND Degree of support available:   fair    CURRENT CONCERNS Current Concerns  Post-Acute Placement   Other Concerns:   none    SOCIAL WORK ASSESSMENT / PLAN CSW assess pt at bedside.  Pt is in good spirits and is looking forward to going to SNF for rehab.  She doesn't want to worry about infection at home.  Pt is agreeable to SNF.  Pt has boyfriend at home.  They have been in a relationship for 15 years.  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  RN and CSW has discussed the harmful effects of drugs on a pt having a procedure such as hers. She understands the risks and understands this is another reason that SNF is recommended   Assessment/plan status:  Psychosocial Support/Ongoing Assessment of Needs Other assessment/ plan:   none   Information/referral to community resources:   SNF    PATIENT'S/FAMILY'S RESPONSE TO PLAN OF CARE: pt is very appreciative of CSW involvement in  d/c plans.        [name] [name], [name] [name] [phone]  Clinical Social Work

## 2012-08-18 NOTE — Clinical Social Work Placement (Addendum)
Clinical Social Work Department CLINICAL SOCIAL WORK PLACEMENT NOTE 08/18/2012  Patient:  [name] [name], [name] [name]  Account Number:  [id number] Admit date:  08/16/2012  Clinical Social Worker:  Wylene Men  Date/time:  08/18/2012 04:16 PM  Clinical Social Work is seeking post-discharge placement for this patient at the following level of care:   SKILLED NURSING   (*CSW will update this form in Epic as items are completed)   08/18/2012  Patient/family provided with [place] Department of Clinical Social Work's list of facilities offering this level of care within the geographic area requested by the patient (or if unable, by the patient's family).  08/18/2012  Patient/family informed of their freedom to choose among providers that offer the needed level of care, that participate in Medicare, Medicaid or managed care program needed by the patient, have an available bed and are willing to accept the patient.  08/18/2012  Patient/family informed of [facility]' ownership interest in [facility], as well as of the fact that they are under no obligation to receive care at this facility.  PASARR submitted to EDS on 08/18/2012 PASARR number received from EDS on   FL2 transmitted to all facilities in geographic area requested by pt/family on  08/18/2012 FL2 transmitted to all facilities within larger geographic area on   Patient informed that his/her managed care company has contracts with or will negotiate with  certain facilities, including the following:     Patient/family informed of bed offers received:  09/23/2012  Patient chooses bed at [facility] recommends and patient chooses bed at  none  Patient to be transferred to  on  09/24/2012 Patient to be transferred to facility by EMS- [facility]  The following physician request were entered in Epic:   Additional Comments:  Nonnie Done, [place] [phone]  Clinical Social Work

## 2012-08-19 LAB — GLUCOSE, CAPILLARY
Glucose-Capillary: 201 mg/dL — ABNORMAL HIGH (ref 70–99)
Glucose-Capillary: 181 mg/dL — ABNORMAL HIGH (ref 70–99)
Glucose-Capillary: 186 mg/dL — ABNORMAL HIGH (ref 70–99)
Glucose-Capillary: 79 mg/dL (ref 70–99)
Glucose-Capillary: 120 mg/dL — ABNORMAL HIGH (ref 70–99)

## 2012-08-19 MED ORDER — LACTULOSE 10 GM/15ML PO SOLN
30.0000 g | Freq: Every day | ORAL | Status: DC | PRN
  Administered 2012-08-20 – 2012-09-17 (×8): 30 g via ORAL
  Filled 2012-08-19 (×9): qty 45

## 2012-08-19 MED ORDER — MAGNESIUM HYDROXIDE 400 MG/5ML PO SUSP
30.0000 mL | Freq: Two times a day (BID) | ORAL | Status: DC | PRN
  Administered 2012-08-19 – 2012-09-10 (×3): 30 mL via ORAL
  Filled 2012-08-19 (×4): qty 30

## 2012-08-19 NOTE — Progress Notes (Addendum)
2 Days Post-Op Procedure(s) (LRB): STERNAL WOUND DEBRIDEMENT (N/A)  Subjective: [name] [name] has no new complaints this morning.  She denies chest pain and shortness of breath.  No bowel movement since last Thursday  Objective: Vital signs in last 24 hours: Temp:  [98.3 F (36.8 C)-99 F (37.2 C)] 98.6 F (37 C) (08/22 0532) Pulse Rate:  [81-95] 84  (08/22 1105) Cardiac Rhythm:  [-] Normal sinus rhythm (08/21 2100) Resp:  [18] 18  (08/22 0532) BP: (114-128)/(71-83) 122/71 mmHg (08/22 1105) SpO2:  [97 %-100 %] 100 % (08/22 0532)  Intake/Output from previous day: 08/21 0701 - 08/22 0700 In: 840 [P.O.:840] Out: -  Intake/Output this shift: Total I/O In: 3 [I.V.:3] Out: -   General appearance: alert, cooperative and no distress Heart: regular rate and rhythm Lungs: clear to auscultation bilaterally Abdomen: soft, non-tender; bowel sounds normal; no masses,  no organomegaly Extremities: edema trace Wound: clean and dry  Lab Results:  Basename 08/18/12 0500 08/16/12 1327  WBC 7.1 7.2  HGB 10.4* 10.5*  HCT 33.3* 32.6*  PLT 458* 425*   BMET:  Basename 08/18/12 0500 08/16/12 1327  NA 139 138  K 4.0 4.2  CL 100 101  CO2 30 28  GLUCOSE 216* 274*  BUN 14 14  CREATININE 0.86 0.90  CALCIUM 9.8 10.0    PT/INR:  Basename 08/16/12 1327  LABPROT 14.6  INR 1.12   ABG    Component Value Date/Time   PHART 7.460* 08/16/2012 1330   HCO3 26.8* 08/16/2012 1330   TCO2 27.9 08/16/2012 1330   ACIDBASEDEF 1.0 07/09/2012 2154   O2SAT 94.7 08/16/2012 1330   CBG (last 3)   Basename 08/19/12 1110 08/19/12 0557 08/18/12 2129  GLUCAP 186* 181* 133*    Assessment/Plan: S/P Procedure(s) (LRB): STERNAL WOUND DEBRIDEMENT (N/A)  1. CV- NSR on Lopressor, restarted home dose yesterday 2. Sternal wound infection- wound vac changed today, will continue IV Zosyn and Vancomycin, need to clarify duration of therapy for discharge planning 3. LOC constipation- will order MOM  4. Dispo-  patient doing well, requests discharge to SNF when appropriate.   LOS: 3 days    Ellwood Handler 08/19/2012   Patient will need hospital care until wound shows better granulation. Cont current antibiotics and glucose contrrol

## 2012-08-19 NOTE — Consult Note (Signed)
WOC consult Note Reason for Consult: VAC dressing change.  Appears NPWT placed in OR 08/17/12, would be due for change today.   Wound type: sternum post debridement Measurement: 7cm x 5cm x 3cm  Wound bed: early granulation, 25% slough in base with exposed sternal wires Drainage (amount, consistency, odor) serosanguinous in the canister, oozing from around sternal wires with dressing change Periwound: intact without problems Dressing procedure/placement/frequency: 1pc black granufoam applied, seal obtained at 158mmHG.  Bedside nursing ok to change moving forward, uncomplicated VAC change.  Pt tolerated well had received PO pain meds prior to dressing change.    Notified SW for unit that rehab facility will need to be able to manage VAC dressings.  T/Thu/Sat.  [place] nurse will follow along for assistance with NPWT dressings as needed. Discussed with bedside nursing that next change will be due Saturday.  Thanks  Delaine Hernandez [place], [place] [phone])

## 2012-08-20 ENCOUNTER — Inpatient Hospital Stay (HOSPITAL_COMMUNITY): Payer: Medicaid Other

## 2012-08-20 LAB — WOUND CULTURE: Gram Stain: NONE SEEN

## 2012-08-20 LAB — GLUCOSE, CAPILLARY
Glucose-Capillary: 167 mg/dL — ABNORMAL HIGH (ref 70–99)
Glucose-Capillary: 149 mg/dL — ABNORMAL HIGH (ref 70–99)
Glucose-Capillary: 130 mg/dL — ABNORMAL HIGH (ref 70–99)
Glucose-Capillary: 225 mg/dL — ABNORMAL HIGH (ref 70–99)

## 2012-08-20 MED ORDER — IOHEXOL 300 MG/ML  SOLN
100.0000 mL | Freq: Once | INTRAMUSCULAR | Status: AC | PRN
  Administered 2012-08-20: 100 mL via INTRAVENOUS

## 2012-08-20 MED ORDER — ENOXAPARIN SODIUM 40 MG/0.4ML ~~LOC~~ SOLN
40.0000 mg | SUBCUTANEOUS | Status: DC
  Administered 2012-08-20 – 2012-09-23 (×33): 40 mg via SUBCUTANEOUS
  Filled 2012-08-20 (×36): qty 0.4

## 2012-08-20 MED ORDER — CIPROFLOXACIN IN D5W 400 MG/200ML IV SOLN
400.0000 mg | Freq: Two times a day (BID) | INTRAVENOUS | Status: DC
  Administered 2012-08-20 – 2012-09-12 (×47): 400 mg via INTRAVENOUS
  Filled 2012-08-20 (×50): qty 200

## 2012-08-20 NOTE — Progress Notes (Addendum)
3 Days Post-Op Procedure(s) (LRB): STERNAL WOUND DEBRIDEMENT (N/A)  Subjective: No new complaints.  She just took some MOM for her constipation.    Objective: Vital signs in last 24 hours: Temp:  [98.2 F (36.8 C)-98.7 F (37.1 C)] 98.2 F (36.8 C) (08/23 0531) Pulse Rate:  [73-86] 73  (08/23 0531) Cardiac Rhythm:  [-] Normal sinus rhythm (08/22 1945) Resp:  [18] 18  (08/23 0531) BP: (108-125)/(68-85) 108/68 mmHg (08/23 0531) SpO2:  [95 %-98 %] 95 % (08/23 0531) Weight:  [255 lb 8.2 oz (115.9 kg)] 255 lb 8.2 oz (115.9 kg) (08/23 0531)  Intake/Output from previous day: 08/22 0701 - 08/23 0700 In: 483 [P.O.:480; I.V.:3] Out: 600 [Urine:600]  General appearance: alert, cooperative and no distress Heart: regular rate and rhythm Lungs: clear to auscultation bilaterally Abdomen: soft, non-tender; bowel sounds normal; no masses,  no organomegaly Extremities: extremities normal, atraumatic, no cyanosis or edema Wound: wound vac in place  Lab Results:  Basename 08/18/12 0500  WBC 7.1  HGB 10.4*  HCT 33.3*  PLT 458*   BMET:  Basename 08/18/12 0500  NA 139  K 4.0  CL 100  CO2 30  GLUCOSE 216*  BUN 14  CREATININE 0.86  CALCIUM 9.8    PT/INR: No results found for this basename: LABPROT,INR in the last 72 hours ABG    Component Value Date/Time   PHART 7.460* 08/16/2012 1330   HCO3 26.8* 08/16/2012 1330   TCO2 27.9 08/16/2012 1330   ACIDBASEDEF 1.0 07/09/2012 2154   O2SAT 94.7 08/16/2012 1330   CBG (last 3)   Basename 08/20/12 0620 08/19/12 2112 08/19/12 1621  GLUCAP 167* 120* 79    Assessment/Plan: S/P Procedure(s) (LRB): STERNAL WOUND DEBRIDEMENT (N/A)  1. CV- NSR on [place] 2. Sternal wound infection- wound vac change due Saturday, continue IV ABx 3. LOC constipation- ordered MOM, patient took  4. Dispo- will continue current care, once wound is appropriate for outpatient wound care will arrange SNF placement   LOS: 4 days    BARRETT,  ERIN 08/20/2012   patient examined and medical record reviewed,agree with above note. Wound culture from OR growing Klebsiella and Morganella both highly sensitive to Cipro - will chang to IV Cipro and change VAC SAT, MON with plans for SNF if wound is improved by Mon. Check CT chest. [name],Oleg [place]on 08/20/2012

## 2012-08-20 NOTE — Progress Notes (Signed)
CSW visited with pt at bedside re: d/c plans.  CSW presented bed offers to pt.  Of the bed offers received, the pt req bed at [facility].  An LOG will have to be completed prior to d/c for the R&B along with Rehab.  Pt is projected to d/c on Monday.  CSW will f/u with SNF, [facility] concerning admission and will f/u with pt prior to d/c. Nonnie Done, [place] [phone]  Clinical Social Work

## 2012-08-20 NOTE — Progress Notes (Signed)
CARDIAC REHAB PHASE I   PRE:  Rate/Rhythm: 96 SR  BP:  Supine: 108/70  Sitting:   Standing:    SaO2: 95 RA  MODE:  Ambulation: 270 ft   POST:  Rate/Rhythem: 103 ST  BP:  Supine:   Sitting: 90/60  Standing:    SaO2: 96 RA 1440-1510 Assisted X 1 and used pt's cane to ambulate. Gait steady with cane. VS stable Pt to recliner after walk with call light in reach. Pt  needs motivation to ambulate and is reluctant.  [name] [name]

## 2012-08-21 ENCOUNTER — Inpatient Hospital Stay (HOSPITAL_COMMUNITY): Payer: Medicaid Other

## 2012-08-21 LAB — BASIC METABOLIC PANEL
BUN: 21 mg/dL (ref 6–23)
CO2: 28 mEq/L (ref 19–32)
Calcium: 9.8 mg/dL (ref 8.4–10.5)
Chloride: 99 mEq/L (ref 96–112)
Creatinine, Ser: 1.01 mg/dL (ref 0.50–1.10)
GFR calc Af Amer: 68 mL/min — ABNORMAL LOW (ref >90)
GFR calc non Af Amer: 59 mL/min — ABNORMAL LOW (ref >90)
Glucose, Bld: 167 mg/dL — ABNORMAL HIGH (ref 70–99)
Potassium: 3.6 mEq/L (ref 3.5–5.1)
Sodium: 140 mEq/L (ref 135–145)

## 2012-08-21 LAB — CBC
HCT: 32.4 % — ABNORMAL LOW (ref 36.0–46.0)
Hemoglobin: 10 g/dL — ABNORMAL LOW (ref 12.0–15.0)
MCH: 24.4 pg — ABNORMAL LOW (ref 26.0–34.0)
MCHC: 30.9 g/dL (ref 30.0–36.0)
MCV: 79 fL (ref 78.0–100.0)
Platelets: 492 10*3/uL — ABNORMAL HIGH (ref 150–400)
RBC: 4.1 MIL/uL (ref 3.87–5.11)
RDW: 17.7 % — ABNORMAL HIGH (ref 11.5–15.5)
WBC: 7.1 10*3/uL (ref 4.0–10.5)

## 2012-08-21 LAB — GLUCOSE, CAPILLARY
Glucose-Capillary: 166 mg/dL — ABNORMAL HIGH (ref 70–99)
Glucose-Capillary: 117 mg/dL — ABNORMAL HIGH (ref 70–99)
Glucose-Capillary: 79 mg/dL (ref 70–99)
Glucose-Capillary: 129 mg/dL — ABNORMAL HIGH (ref 70–99)

## 2012-08-21 LAB — PREALBUMIN: Prealbumin: 14.6 mg/dL — ABNORMAL LOW (ref 17.0–34.0)

## 2012-08-21 MED ORDER — POTASSIUM CHLORIDE CRYS ER 10 MEQ PO TBCR
30.0000 meq | EXTENDED_RELEASE_TABLET | Freq: Once | ORAL | Status: AC
  Administered 2012-08-21: 30 meq via ORAL
  Filled 2012-08-21: qty 1

## 2012-08-21 NOTE — Progress Notes (Addendum)
4 Days Post-Op Procedure(s) (LRB): STERNAL WOUND DEBRIDEMENT (N/A)  Subjective: Patient without complaints this am.Eating breakfast and she did have a bowel movement yesterday.  Objective: Vital signs in last 24 hours: Patient Vitals for the past 24 hrs:  BP Temp Temp src Pulse Resp SpO2 Weight  08/21/12 0428 108/70 mmHg 99.3 F (37.4 C) Oral 88  18  98 % 252 lb 12.8 oz (114.669 kg)  08/20/12 1952 107/70 mmHg 99.2 F (37.3 C) Oral 84  18  97 % -  08/20/12 1400 105/75 mmHg 98.1 F (36.7 C) Oral 79  18  95 % -   Current Weight  08/21/12 252 lb 12.8 oz (114.669 kg)      Intake/Output from previous day: 08/23 0701 - 08/24 0700 In: 720 [P.O.:720] Out: 1351 [Urine:1350; Stool:1]   Physical Exam:  Cardiovascular: RRR. Pulmonary: Slightly diminished at bases; no rales, wheezes, or rhonchi. Abdomen: Soft, non tender, bowel sounds present. Extremities: Trace bilateral lower extremity edema. Wound: VAC in place.  Lab Results: CBC:  Basename 08/21/12 0600  WBC 7.1  HGB 10.0*  HCT 32.4*  PLT 492*   BMET:   Basename 08/21/12 0600  NA 140  K 3.6  CL 99  CO2 28  GLUCOSE 167*  BUN 21  CREATININE 1.01  CALCIUM 9.8    PT/INR:  Lab Results  Component Value Date   INR 1.12 08/16/2012   INR 1.34 07/09/2012   INR 1.15 06/29/2012   ABG:  INR: Will add last result for INR, ABG once components are confirmed Will add last 4 CBG results once components are confirmed  Assessment/Plan:  1. CV - SR.Continue Lopressor as taken pre op. 2.Superficial sternal wound infection-continue VAC and antibiotic (Cipro) for Klebs Pneum and Morganella Morg.Vac to be changed today. 3.DM-CBGs 130/225/166.Continue  Amaryl and Metformin. Continue Insulin PRN. 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 5.Supplement potassium.   ZIMMERMAN,DONIELLE MPA-C 08/21/2012  Vac removed and wound examined, wires exposed but granulating well with out purulence    I have seen and examined Rosemary Holms and agree with the above assessment  and plan.  [name] MD Beeper [phone] Office [phone] 08/21/2012 12:26 PM

## 2012-08-21 NOTE — Progress Notes (Addendum)
Wound Vac changed , patient tolerated well. Pre- medicated,  Suction restarted as ordered.

## 2012-08-22 LAB — GLUCOSE, CAPILLARY
Glucose-Capillary: 145 mg/dL — ABNORMAL HIGH (ref 70–99)
Glucose-Capillary: 140 mg/dL — ABNORMAL HIGH (ref 70–99)
Glucose-Capillary: 73 mg/dL (ref 70–99)
Glucose-Capillary: 125 mg/dL — ABNORMAL HIGH (ref 70–99)

## 2012-08-22 LAB — ANAEROBIC CULTURE: Gram Stain: NONE SEEN

## 2012-08-22 MED ORDER — OXYCODONE HCL 10 MG PO TABS: 10.0000 mg | ORAL_TABLET | ORAL | Status: DC | PRN

## 2012-08-22 MED ORDER — METOPROLOL TARTRATE 25 MG PO TABS: 25.0000 mg | ORAL_TABLET | Freq: Two times a day (BID) | ORAL | Status: DC

## 2012-08-22 MED ORDER — CIPROFLOXACIN IN D5W 400 MG/200ML IV SOLN: 400.0000 mg | Freq: Two times a day (BID) | INTRAVENOUS | Status: DC

## 2012-08-22 MED ORDER — POTASSIUM CHLORIDE CRYS ER 20 MEQ PO TBCR: 20.0000 meq | EXTENDED_RELEASE_TABLET | Freq: Every day | ORAL | Status: DC

## 2012-08-22 NOTE — Progress Notes (Addendum)
5 Days Post-Op Procedure(s) (LRB): STERNAL WOUND DEBRIDEMENT (N/A)  Subjective: Patient resting this am.She has no complaints  Objective: Vital signs in last 24 hours: Patient Vitals for the past 24 hrs:  BP Temp Temp src Pulse Resp SpO2 Weight  08/22/12 0417 133/62 mmHg 98.8 F (37.1 C) Oral 82  18  97 % 250 lb 9.6 oz (113.671 kg)  08/21/12 2035 128/70 mmHg 98.8 F (37.1 C) Oral 84  17  96 % -  08/21/12 1501 106/48 mmHg 98.3 F (36.8 C) Oral 79  16  96 % -  08/21/12 0955 145/81 mmHg - - 88  - - -   Current Weight  08/22/12 250 lb 9.6 oz (113.671 kg)     Intake/Output from previous day: 08/24 0701 - 08/25 0700 In: 300 [P.O.:300] Out: -    Physical Exam:  Cardiovascular: RRR. Pulmonary: Slightly diminished at bases; no rales, wheezes, or rhonchi. Abdomen: Soft, non tender, bowel sounds present. Extremities: Trace bilateral lower extremity edema. Wound: VAC in place.  Lab Results: CBC:  Basename 08/21/12 0600  WBC 7.1  HGB 10.0*  HCT 32.4*  PLT 492*   BMET:   Basename 08/21/12 0600  NA 140  K 3.6  CL 99  CO2 28  GLUCOSE 167*  BUN 21  CREATININE 1.01  CALCIUM 9.8    PT/INR:  Lab Results  Component Value Date   INR 1.12 08/16/2012   INR 1.34 07/09/2012   INR 1.15 06/29/2012   ABG:  INR: Will add last result for INR, ABG once components are confirmed Will add last 4 CBG results once components are confirmed  Assessment/Plan:  1. CV - SR.Continue Lopressor as taken pre op. 2.Superficial sternal wound infection-continue VAC and antibiotic (Cipro) for Klebs Pneum and Morganella Morg.Vac changed yesterday and granulation was present. 3.DM-CBGs 79/129/145.Continue  Amaryl and Metformin. Continue Insulin PRN. 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 5.Possible to  SNF in am.   [name] [name] MPA-C 08/22/2012 9:17 AM  Wound seen yesterday when vac changed Poss SNF soon I have seen and examined [name] [name] and agree with the above assessment  and plan.  [name] MD Beeper [phone] Office [phone] 08/22/2012 12:35 PM

## 2012-08-22 NOTE — Discharge Summary (Signed)
Physician Discharge Summary  Patient ID: [name] [name] MRN: [id number] DOB/AGE: [birth year] 61 y.o.  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 Superficial sternal wound debridement.  2. Placement of wound VAC by Dr. Prescott Gum on 08/17/2012.  History of Presenting Illness: This is a 61 year old African American,obese, poorly controlled diabetic with history of cocaine and tobacco abuse who presented to the office for wound check on 08/16/2012.She had multivessel bypass grafting x 3 about one month ago.She was seen in the office 10 days ago, prior to this admission, with a superficial sternal wound infection. This was packed with a half-inch gauze and she was placed on oral antibiotics (Keflex three times daily).  She returned 08/16/2012 with progression of the sternal wound infection,despite her oral antibiotics and superficial debridement of subcutaneous fat of the upper third of the incision.  The sternal wound is stable without the essence and there is no surrounding cellulitis.Because of her diabetes and poor healing, she was admitted for IV antibiotics, formal debridement and tplacement of a wound VAC to enhance healing. It should ne noted the sternal wound infection was previously cultured.Results showed a variety of  organisms including Proteus and Klebsiella.  Brief Hospital Course:  She has remained afebrile and vital signs stable. She was initially placed on Zosyn and Vancomycin. Cultures then showed Klebsiella Pneumoniae and Morganella Morganii. Her antibiotic was changed to Cipro for better coverage.A  CT scan was done 8/23 and showed air in the sternotomy defect most likely consistent with osteomyelitis (most significant tin the supper sternum and manubrium).Her wound was clean and dry, had granulation, and no surrounding erythema.Provided she remains afebrile and hemodynamically stable, she will stable for discharge to SNF. Antibiotic frequency and length to be determined prior to discharge.  Latest Vital Signs: Blood pressure 133/62, pulse 82, temperature 98.8 F (37.1 C), temperature source Oral, resp. rate 18, height 5\' 9"  (1.753 m), weight 250 lb 9.6 oz (113.671 kg), SpO2 97.00%.  Physical Exam: Cardiovascular: RRR.  Pulmonary: Slightly diminished at bases; no rales, wheezes, or rhonchi.  Abdomen: Soft, non tender, bowel sounds present.  Extremities: Trace bilateral lower extremity edema.  Wound: VAC in place.  Discharge Condition:Stable  Recent laboratory studies:  Lab Results  Component Value Date   WBC 7.1 08/21/2012   HGB 10.0* 08/21/2012   HCT 32.4* 08/21/2012   MCV 79.0 08/21/2012   PLT 492* 08/21/2012   Lab Results  Component Value Date   NA 140 08/21/2012   K 3.6 08/21/2012   CL 99 08/21/2012   CO2 28 08/21/2012   CREATININE 1.01 08/21/2012   GLUCOSE 167* 08/21/2012      Diagnostic Studies: Dg Chest 2 View  08/21/2012  *RADIOLOGY REPORT*  Clinical Data: Post CABG  CHEST - 2 VIEW  Comparison: 08/11/2012 and 08/20/2012  Findings: Two views of the chest demonstrate a right arm PICC line. PICC line tip in  the lower SVC.  Densities at the right lung base are suggestive for atelectasis.  Upper lungs are clear.  Trachea is midline.  Stable appearance of the heart and mediastinum.  No  significant pleural effusions.  IMPRESSION: Right basilar atelectasis.  PICC line tip in the lower SVC.   Original Report Authenticated By: Markus Daft, M.D.     Ct Chest W Contrast  08/20/2012  *RADIOLOGY REPORT*  Clinical Data: Sternal wound infection.  CT CHEST WITH CONTRAST  Technique:  Multidetector CT imaging of the chest was performed following the standard protocol during bolus administration of intravenous contrast.  Contrast: 160mL OMNIPAQUE IOHEXOL 300 MG/ML  SOLN  Comparison: None  Findings: There are surgical changes from a recent median sternotomy and bypass surgery.  The sternal wires appear intact. There is an open sternal wound with locules of air noted in the soft tissues and in the sternum consistent with osteomyelitis.  No discrete drainable soft tissue abscess.  No substernal abscess.  No supraclavicular or axillary lymphadenopathy.  Small scattered lymph nodes are noted.  There are irregular lesions and calcifications in the right thyroid lobe.  These have been previously biopsied.  The heart is within normal limits in size.  There is a small amount of pericardial fluid but no overt effusion.  The aorta is normal in caliber.  No dissection.  The major branch vessels are patent. There are small scattered mediastinal and hilar lymph nodes but no mass or adenopathy.  The esophagus is grossly normal.  Examination of the lung parenchyma demonstrates no worrisome mass lesions or nodules.  There is streaky bibasilar atelectasis.  The upper abdomen is unremarkable.  IMPRESSION:  1.  Large open sternal wound without discrete soft tissue abscess. 2.  Air noted in the sternotomy defect and in the bone consistent with osteomyelitis.  This is most significant in the upper sternum and manubrium. No findings for substernal abscess. 3.  Streaky bibasilar atelectasis.   Original Report Authenticated By: P. Kalman Jewels, M.D.        Discharge Orders    Future Appointments: Provider: Department: Dept  Phone: Center:   09/02/2012 10:30 AM Lelon Perla, [place] [phone] [id number]      Discharge Medications: Medication List  As of 08/22/2012 11:52 AM   STOP taking these medications         traMADol 50 MG tablet         TAKE these medications         aspirin 325 MG tablet   Take 325 mg by mouth daily.      brinzolamide 1 % ophthalmic suspension   Commonly known as: AZOPT   Place 1 drop into both eyes 3 (three) times daily.      cetirizine 10 MG tablet   Commonly known as: ZYRTEC   Take 10 mg by mouth daily.      ciprofloxacin 400 MG/200ML Soln   Commonly known as: CIPRO   Inject 200 mLs (400 mg total) into the vein every 12 (twelve) hours. Please administer for 3 weeks      Fluticasone-Salmeterol 250-50 MCG/DOSE Aepb   Commonly known as: ADVAIR   Inhale 1 puff into the lungs every 12 (twelve) hours.      furosemide 40 MG tablet   Commonly known as: LASIX   Take 40 mg by mouth daily.      glimepiride 4 MG tablet   Commonly known as: AMARYL   Take 4 mg by mouth  daily before breakfast.      latanoprost 0.005 % ophthalmic solution   Commonly known as: XALATAN   Place 1 drop into both eyes at bedtime.      lisinopril 5 MG tablet   Commonly known as: PRINIVIL,ZESTRIL   Take 5 mg by mouth daily.      metFORMIN 500 MG tablet   Commonly known as: GLUCOPHAGE   Take 1,000 mg by mouth 2 (two) times daily with a meal.      metoprolol tartrate 25 MG tablet   Commonly known as: LOPRESSOR   Take 1 tablet (25 mg total) by mouth 2 (two) times daily.      Oxycodone HCl 10 MG Tabs   Take 1 tablet (10 mg total) by mouth every 4 (four) hours as needed for pain.      pravastatin 40 MG tablet   Commonly known as: PRAVACHOL   Take 40 mg by mouth at bedtime.            Follow Up Appointments: Follow-up Information    Follow up with VAN Wilber Oliphant, MD. (Office will call with an appointment date and time for one week)    Contact information:     [facility] [place] [phone]       Follow up with SNF or home health nurse. (Please remove PICC line after last dose of antibiotic)          Signed: Dahlila Pfahler MPA-C 08/22/2012, 11:52 AM

## 2012-08-23 ENCOUNTER — Encounter (HOSPITAL_COMMUNITY): Payer: Self-pay

## 2012-08-23 LAB — CBC
HCT: 33.5 % — ABNORMAL LOW (ref 36.0–46.0)
Hemoglobin: 10.5 g/dL — ABNORMAL LOW (ref 12.0–15.0)
MCH: 24.6 pg — ABNORMAL LOW (ref 26.0–34.0)
MCHC: 31.3 g/dL (ref 30.0–36.0)
MCV: 78.5 fL (ref 78.0–100.0)
Platelets: 504 10*3/uL — ABNORMAL HIGH (ref 150–400)
RBC: 4.27 MIL/uL (ref 3.87–5.11)
RDW: 17.8 % — ABNORMAL HIGH (ref 11.5–15.5)
WBC: 7.6 10*3/uL (ref 4.0–10.5)

## 2012-08-23 LAB — GLUCOSE, CAPILLARY
Glucose-Capillary: 121 mg/dL — ABNORMAL HIGH (ref 70–99)
Glucose-Capillary: 163 mg/dL — ABNORMAL HIGH (ref 70–99)
Glucose-Capillary: 83 mg/dL (ref 70–99)
Glucose-Capillary: 116 mg/dL — ABNORMAL HIGH (ref 70–99)

## 2012-08-23 LAB — TYPE AND SCREEN
ABO/RH(D): O POS
Antibody Screen: NEGATIVE

## 2012-08-23 MED ORDER — CEFUROXIME SODIUM 1.5 G IJ SOLR
1.5000 g | Freq: Three times a day (TID) | INTRAMUSCULAR | Status: DC
  Filled 2012-08-23: qty 1.5

## 2012-08-23 MED ORDER — KCL IN DEXTROSE-NACL 10-5-0.45 MEQ/L-%-% IV SOLN
INTRAVENOUS | Status: DC
  Administered 2012-08-24: 02:00:00 via INTRAVENOUS
  Filled 2012-08-23 (×2): qty 1000

## 2012-08-23 NOTE — Progress Notes (Signed)
[place] refused to walk. Could not give reason except that she did not want to. Said she might walk with boyfriend later. Stressed importance of walking but pt still would not go. Adoni Greenough DunlapRN

## 2012-08-23 NOTE — Progress Notes (Addendum)
[address]            [place] [address]          [phone]     6 Days Post-Op  Procedure(s) (LRB): STERNAL WOUND DEBRIDEMENT (N/A) Subjective: Feels ok, mild pain from wound  Objective  Telemetry sinus rhythm, some pvc's  Temp:  [98.1 F (36.7 C)-98.9 F (37.2 C)] 98.7 F (37.1 C) (08/26 0405) Pulse Rate:  [79-97] 79  (08/26 0405) Resp:  [18-19] 19  (08/26 0405) BP: (106-127)/(70-71) 117/70 mmHg (08/26 0405) SpO2:  [96 %-97 %] 97 % (08/26 0405) Weight:  [250 lb 3.2 oz (113.49 kg)] 250 lb 3.2 oz (113.49 kg) (08/26 0405)   Intake/Output Summary (Last 24 hours) at 08/23/12 0731 Last data filed at 08/23/12 0300  Gross per 24 hour  Intake    480 ml  Output    500 ml  Net    -20 ml       General appearance: alert, cooperative and no distress Heart: regular rate and rhythm and S1, S2 normal Lungs: clear to auscultation bilaterally Abdomen: soft, nontender, obese Extremities: no edema Wound: vac in place, no cellulitis noted  Lab Results:  Basename 08/21/12 0600  NA 140  K 3.6  CL 99  CO2 28  GLUCOSE 167*  BUN 21  CREATININE 1.01  CALCIUM 9.8  MG --  PHOS --   No results found for this basename: AST:2,ALT:2,ALKPHOS:2,BILITOT:2,PROT:2,ALBUMIN:2 in the last 72 hours No results found for this basename: LIPASE:2,AMYLASE:2 in the last 72 hours  Basename 08/21/12 0600  WBC 7.1  NEUTROABS --  HGB 10.0*  HCT 32.4*  MCV 79.0  PLT 492*   No results found for this basename: CKTOTAL:4,CKMB:4,TROPONINI:4 in the last 72 hours No components found with this basename: POCBNP:3 No results found for this basename: DDIMER in the last 72 hours No results found for this basename: HGBA1C in the last 72 hours No results found for this basename: CHOL,HDL,LDLCALC,TRIG,CHOLHDL in the last 72 hours No results found for this basename: TSH,T4TOTAL,FREET3,T3FREE,THYROIDAB in the last 72 hours No results found for this basename:  VITAMINB12,FOLATE,FERRITIN,TIBC,IRON,RETICCTPCT in the last 72 hours  Medications: Scheduled    . aspirin  325 mg Oral Daily  . ciprofloxacin  400 mg Intravenous Q12H  . enoxaparin  40 mg Subcutaneous Q24H  . furosemide  40 mg Oral Daily  . glimepiride  4 mg Oral Q breakfast  . insulin aspart  0-20 Units Subcutaneous TID WC  . insulin aspart  0-5 Units Subcutaneous QHS  . insulin aspart  4 Units Subcutaneous TID WC  . metFORMIN  1,000 mg Oral BID  . metoprolol tartrate  25 mg Oral BID  . pantoprazole  40 mg Oral Q1200  . simvastatin  20 mg Oral q1800  . sodium chloride  10-40 mL Intracatheter Q12H     Radiology/Studies:  No results found.  INR: Will add last result for INR, ABG once components are confirmed Will add last 4 CBG results once components are confirmed  Assessment/Plan: S/P Procedure(s) (LRB): STERNAL WOUND DEBRIDEMENT (N/A)   1. Doing well, ? Duration for cipro? Poss change to po?  Will need vac change schedule?M/W/F? 2 poss SNF soon   LOS: 7 days    [name] [name],[name] [name] 8/26/20137:31 AM    Wound examined at bedside Some necrotic material and exposed wires are at depth of wound She will need  return to OR for further debridement  And in hospital care Cont IV cipro for gram neg organism cultures

## 2012-08-24 ENCOUNTER — Inpatient Hospital Stay (HOSPITAL_COMMUNITY): Payer: Medicaid Other | Admitting: Anesthesiology

## 2012-08-24 ENCOUNTER — Encounter (HOSPITAL_COMMUNITY): Payer: Self-pay | Admitting: Anesthesiology

## 2012-08-24 ENCOUNTER — Encounter (HOSPITAL_COMMUNITY)
Admission: AD | Disposition: A | Discharge status: Skilled Nursing Facility | Payer: Self-pay | Source: Ambulatory Visit | Attending: Cardiothoracic Surgery

## 2012-08-24 DIAGNOSIS — L02219 Cutaneous abscess of trunk, unspecified: Secondary | ICD-10-CM

## 2012-08-24 DIAGNOSIS — L03319 Cellulitis of trunk, unspecified: Secondary | ICD-10-CM

## 2012-08-24 HISTORY — PX: STERNAL WOUND DEBRIDEMENT: SHX1058

## 2012-08-24 LAB — GLUCOSE, CAPILLARY
Glucose-Capillary: 119 mg/dL — ABNORMAL HIGH (ref 70–99)
Glucose-Capillary: 142 mg/dL — ABNORMAL HIGH (ref 70–99)
Glucose-Capillary: 116 mg/dL — ABNORMAL HIGH (ref 70–99)
Glucose-Capillary: 187 mg/dL — ABNORMAL HIGH (ref 70–99)

## 2012-08-24 SURGERY — DEBRIDEMENT, WOUND, STERNUM
Anesthesia: General | Site: Chest

## 2012-08-24 MED ORDER — HYDROMORPHONE HCL PF 1 MG/ML IJ SOLN
0.2500 mg | INTRAMUSCULAR | Status: DC | PRN
  Administered 2012-08-31: 0.5 mg via INTRAVENOUS
  Filled 2012-08-24: qty 1

## 2012-08-24 MED ORDER — LACTATED RINGERS IV SOLN
INTRAVENOUS | Status: DC | PRN
  Administered 2012-08-24: 13:00:00 via INTRAVENOUS

## 2012-08-24 MED ORDER — LIDOCAINE HCL (PF) 1 % IJ SOLN
INTRAMUSCULAR | Status: AC
  Filled 2012-08-24: qty 30

## 2012-08-24 MED ORDER — SODIUM CHLORIDE 0.9 % IR SOLN
Status: DC | PRN
  Administered 2012-08-24: 3000 mL

## 2012-08-24 MED ORDER — LACTATED RINGERS IV SOLN
INTRAVENOUS | Status: DC
  Administered 2012-08-24 – 2012-08-31 (×4): via INTRAVENOUS

## 2012-08-24 MED ORDER — ONDANSETRON HCL 4 MG/2ML IJ SOLN
INTRAMUSCULAR | Status: DC | PRN
  Administered 2012-08-24: 4 mg via INTRAVENOUS

## 2012-08-24 MED ORDER — CEFAZOLIN SODIUM-DEXTROSE 2-3 GM-% IV SOLR
INTRAVENOUS | Status: DC | PRN
  Administered 2012-08-24: 2 g via INTRAVENOUS

## 2012-08-24 MED ORDER — ONDANSETRON HCL 4 MG/2ML IJ SOLN: 4.0000 mg | Freq: Once | INTRAMUSCULAR | Status: AC | PRN

## 2012-08-24 MED ORDER — FENTANYL CITRATE 0.05 MG/ML IJ SOLN
INTRAMUSCULAR | Status: DC | PRN
  Administered 2012-08-24: 125 ug via INTRAVENOUS

## 2012-08-24 MED ORDER — PHENYLEPHRINE HCL 10 MG/ML IJ SOLN
INTRAMUSCULAR | Status: DC | PRN
  Administered 2012-08-24 (×2): 80 ug via INTRAVENOUS

## 2012-08-24 MED ORDER — SODIUM CHLORIDE 0.9 % IR SOLN
Status: DC | PRN
  Administered 2012-08-24: 15:00:00

## 2012-08-24 MED ORDER — PROPOFOL 10 MG/ML IV EMUL
INTRAVENOUS | Status: DC | PRN
  Administered 2012-08-24: 110 mg via INTRAVENOUS

## 2012-08-24 MED ORDER — CEFAZOLIN SODIUM-DEXTROSE 2-3 GM-% IV SOLR
INTRAVENOUS | Status: AC
  Filled 2012-08-24: qty 50

## 2012-08-24 SURGICAL SUPPLY — 66 items
APL SKNCLS STERI-STRIP NONHPOA (GAUZE/BANDAGES/DRESSINGS)
ATTRACTOMAT 16X20 MAGNETIC DRP (DRAPES) ×3 IMPLANT
BAG DECANTER FOR FLEXI CONT (MISCELLANEOUS) ×3 IMPLANT
BAG URIMETER 350ML BARDEX IC (UROLOGICAL SUPPLIES)
BAG URIMETER BARDEX IC 350 (UROLOGICAL SUPPLIES) IMPLANT
BANDAGE GAUZE ELAST BULKY 4 IN (GAUZE/BANDAGES/DRESSINGS) IMPLANT
BENZOIN TINCTURE PRP APPL 2/3 (GAUZE/BANDAGES/DRESSINGS) IMPLANT
BLADE SURG 10 STRL SS (BLADE) ×3 IMPLANT
CANISTER SUCTION 2500CC (MISCELLANEOUS) ×3 IMPLANT
CATH FOLEY 2WAY SLVR  5CC 16FR (CATHETERS)
CATH FOLEY 2WAY SLVR 5CC 16FR (CATHETERS) IMPLANT
CATH THORACIC 28FR RT ANG (CATHETERS) IMPLANT
CATH THORACIC 36FR (CATHETERS) IMPLANT
CATH THORACIC 36FR RT ANG (CATHETERS) IMPLANT
CLIP TI WIDE RED SMALL 24 (CLIP) IMPLANT
CLOTH BEACON ORANGE TIMEOUT ST (SAFETY) ×3 IMPLANT
CONN Y 3/8X3/8X3/8  BEN (MISCELLANEOUS)
CONN Y 3/8X3/8X3/8 BEN (MISCELLANEOUS) IMPLANT
CONT SPEC 4OZ CLIKSEAL STRL BL (MISCELLANEOUS) ×2 IMPLANT
COVER SURGICAL LIGHT HANDLE (MISCELLANEOUS) ×6 IMPLANT
DRAPE LAPAROSCOPIC ABDOMINAL (DRAPES) ×3 IMPLANT
DRAPE WARM FLUID 44X44 (DRAPE) IMPLANT
DRSG ADAPTIC 3X8 NADH LF (GAUZE/BANDAGES/DRESSINGS) ×2 IMPLANT
DRSG PAD ABDOMINAL 8X10 ST (GAUZE/BANDAGES/DRESSINGS) IMPLANT
DRSG VAC ATS SM SENSATRAC (GAUZE/BANDAGES/DRESSINGS) ×2 IMPLANT
ELECT REM PT RETURN 9FT ADLT (ELECTROSURGICAL) ×3
ELECTRODE REM PT RTRN 9FT ADLT (ELECTROSURGICAL) ×1 IMPLANT
GAUZE XEROFORM 5X9 LF (GAUZE/BANDAGES/DRESSINGS) IMPLANT
GLOVE BIO SURGEON STRL SZ7.5 (GLOVE) ×6 IMPLANT
GOWN STRL NON-REIN LRG LVL3 (GOWN DISPOSABLE) ×12 IMPLANT
HANDPIECE INTERPULSE COAX TIP (DISPOSABLE)
HEMOSTAT POWDER SURGIFOAM 1G (HEMOSTASIS) IMPLANT
HEMOSTAT SURGICEL 2X14 (HEMOSTASIS) IMPLANT
KIT BASIN OR (CUSTOM PROCEDURE TRAY) ×3 IMPLANT
KIT ROOM TURNOVER OR (KITS) ×3 IMPLANT
KIT SUCTION CATH 14FR (SUCTIONS) IMPLANT
MATRIX SURGICAL PSM 7X10CM (Tissue) ×2 IMPLANT
MICROMATRIX 500MG (Tissue) ×3 IMPLANT
NS IRRIG 1000ML POUR BTL (IV SOLUTION) ×3 IMPLANT
PACK CHEST (CUSTOM PROCEDURE TRAY) ×3 IMPLANT
PAD ARMBOARD 7.5X6 YLW CONV (MISCELLANEOUS) ×6 IMPLANT
SET HNDPC FAN SPRY TIP SCT (DISPOSABLE) IMPLANT
SOLUTION BETADINE 4OZ (MISCELLANEOUS) IMPLANT
SOLUTION PARTIC MCRMTRX 500MG (Tissue) IMPLANT
SPONGE GAUZE 4X4 12PLY (GAUZE/BANDAGES/DRESSINGS) ×3 IMPLANT
SPONGE LAP 18X18 X RAY DECT (DISPOSABLE) ×3 IMPLANT
STAPLER VISISTAT 35W (STAPLE) IMPLANT
STRAP MONTGOMERY 1.25X11-1/8 (MISCELLANEOUS) IMPLANT
SUT ETHILON 3 0 FSL (SUTURE) IMPLANT
SUT STEEL 6MS V (SUTURE) IMPLANT
SUT STEEL STERNAL CCS#1 18IN (SUTURE) IMPLANT
SUT STEEL SZ 6 DBL 3X14 BALL (SUTURE) IMPLANT
SUT VIC AB 1 CTX 36 (SUTURE) ×6
SUT VIC AB 1 CTX36XBRD ANBCTR (SUTURE) ×2 IMPLANT
SUT VIC AB 2-0 CTX 27 (SUTURE) ×6 IMPLANT
SUT VIC AB 3-0 SH 18 (SUTURE) ×4 IMPLANT
SUT VIC AB 3-0 X1 27 (SUTURE) ×6 IMPLANT
SWAB COLLECTION DEVICE MRSA (MISCELLANEOUS) IMPLANT
SYR 5ML LL (SYRINGE) IMPLANT
TOWEL OR 17X24 6PK STRL BLUE (TOWEL DISPOSABLE) ×3 IMPLANT
TOWEL OR 17X26 10 PK STRL BLUE (TOWEL DISPOSABLE) ×3 IMPLANT
TUBE ANAEROBIC SPECIMEN COL (MISCELLANEOUS) IMPLANT
TUBE CONNECTING 12'X1/4 (SUCTIONS) ×1
TUBE CONNECTING 12X1/4 (SUCTIONS) ×1 IMPLANT
WATER STERILE IRR 1000ML POUR (IV SOLUTION) ×3 IMPLANT
YANKAUER SUCT BULB TIP NO VENT (SUCTIONS) ×2 IMPLANT

## 2012-08-24 NOTE — Transfer of Care (Signed)
Immediate Anesthesia Transfer of Care Note  Patient: [name] [name]  Procedure(s) Performed: Procedure(s) (LRB): STERNAL WOUND DEBRIDEMENT (N/A)  Patient Location: PACU  Anesthesia Type: General  Level of Consciousness: awake, alert  and oriented  Airway & Oxygen Therapy: Patient Spontanous Breathing and Patient connected to face mask  Post-op Assessment: Report given to PACU RN, Post -op Vital signs reviewed and stable and Patient moving all extremities X 4  Post vital signs: Reviewed and stable  Complications: No apparent anesthesia complications

## 2012-08-24 NOTE — Anesthesia Preprocedure Evaluation (Addendum)
Anesthesia Evaluation  Patient identified by MRN, date of birth, ID band Patient awake    Reviewed: Allergy & Precautions, H&P , NPO status , Patient's Chart, lab work & pertinent test results  Airway Mallampati: I TM Distance: >3 FB Neck ROM: full    Dental  (+) Edentulous Upper and Edentulous Lower   Pulmonary Current Smoker,          Cardiovascular hypertension, + angina + CAD and + Past MI Rhythm:regular Rate:Normal     Neuro/Psych CVA, Residual Symptoms    GI/Hepatic   Endo/Other  Type 2  Renal/GU ARFRenal disease     Musculoskeletal   Abdominal   Peds  Hematology   Anesthesia Other Findings Hx of Drug Abuse  Reproductive/Obstetrics                         Anesthesia Physical Anesthesia Plan  ASA: III  Anesthesia Plan: General   Post-op Pain Management:    Induction: Intravenous  Airway Management Planned: Oral ETT  Additional Equipment:   Intra-op Plan:   Post-operative Plan: Possible Post-op intubation/ventilation  Informed Consent: I have reviewed the patients History and Physical, chart, labs and discussed the procedure including the risks, benefits and alternatives for the proposed anesthesia with the patient or authorized representative who has indicated his/her understanding and acceptance.     Plan Discussed with: CRNA, Anesthesiologist and Surgeon  Anesthesia Plan Comments:         Anesthesia Quick Evaluation

## 2012-08-24 NOTE — Anesthesia Procedure Notes (Signed)
Procedure Name: Intubation Date/Time: 08/24/2012 1:40 PM Performed by: Rush Farmer E Pre-anesthesia Checklist: Patient identified, Emergency Drugs available, Suction available, Patient being monitored and Timeout performed Patient Re-evaluated:Patient Re-evaluated prior to inductionOxygen Delivery Method: Circle system utilized Preoxygenation: Pre-oxygenation with 100% oxygen Intubation Type: IV induction Ventilation: Mask ventilation without difficulty and Oral airway inserted - appropriate to patient size Laryngoscope Size: Mac and 4 Grade View: Grade I Tube type: Oral Tube size: 7.5 mm Number of attempts: 1 Airway Equipment and Method: Stylet and LTA kit utilized Placement Confirmation: ETT inserted through vocal cords under direct vision,  positive ETCO2 and breath sounds checked- equal and bilateral Secured at: 21 cm Tube secured with: Tape Dental Injury: Teeth and Oropharynx as per pre-operative assessment

## 2012-08-24 NOTE — Preoperative (Signed)
Beta Blockers   Reason not to administer Beta Blockers:Not Applicable 

## 2012-08-24 NOTE — Brief Op Note (Signed)
08/16/2012 - 08/24/2012  3:01 PM  PATIENT:  [name] [name]  61 y.o. female  PRE-OPERATIVE DIAGNOSIS:  STERNAL INFECTION - superficial POST-OPERATIVE DIAGNOSIS:  STERNAL INFECTION - superficial  PROCEDURE:  Procedure(s) (LRB): STERNAL WOUND DEBRIDEMENT (N/A)  SURGEON:  Surgeon(s) and Role:    * Ivin Poot, MD - Primary  PHYSICIAN ASSISTANT: 0  ASSISTANTS0  ANESTHESIA:   general  EBL:  5cc  BLOOD ADMINISTERED:0  DRAINS: VAC 75 mm Hg suction     SPECIMEN:  0  DISPOSITION OF SPECIMEN:  NA  COUNTS:  correct  TOURNIQUET:  DICTATION: .pending  PLAN OF CARE: Admit to inpatient   PATIENT DISPOSITION:  PACU - hemodynamically stable.   Delay start of Pharmacological VTE agent (>24hrs) due to surgical blood loss or risk of bleeding:yes}

## 2012-08-24 NOTE — Progress Notes (Signed)
[id number] Cardiac Rehab Attempted to get pt to ambulate She refused.Pt states that she does not feel like it. Pt has refused to walk with [place] Saturday, Monday and now today. She did walk with me on Friday and states that she walked with her boyfriend on Sunday. Instructed pt that she should be walking three times a day. Pt said nothing when I told her this. Her family are present and tried to encourage her without success.   Marland Kitchen

## 2012-08-24 NOTE — Anesthesia Postprocedure Evaluation (Signed)
  Anesthesia Post-op Note  Patient: [name] [name]  Procedure(s) Performed: Procedure(s) (LRB): STERNAL WOUND DEBRIDEMENT (N/A)  Patient Location: PACU  Anesthesia Type: General  Level of Consciousness: awake, alert , oriented and patient cooperative  Airway and Oxygen Therapy: Patient Spontanous Breathing and Patient connected to nasal cannula oxygen  Post-op Pain: mild  Post-op Assessment: Post-op Vital signs reviewed, Patient's Cardiovascular Status Stable, Respiratory Function Stable, Patent Airway, No signs of Nausea or vomiting and Pain level controlled  Post-op Vital Signs: stable  Complications: No apparent anesthesia complications

## 2012-08-24 NOTE — Progress Notes (Signed)
The patient was examined and preop studies reviewed. There has been no change from the prior exam and the patient is ready for surgery.  Plan sternal debridement and wound VAC change

## 2012-08-25 ENCOUNTER — Encounter (HOSPITAL_COMMUNITY): Payer: Self-pay | Admitting: Cardiothoracic Surgery

## 2012-08-25 LAB — GLUCOSE, CAPILLARY
Glucose-Capillary: 155 mg/dL — ABNORMAL HIGH (ref 70–99)
Glucose-Capillary: 142 mg/dL — ABNORMAL HIGH (ref 70–99)
Glucose-Capillary: 75 mg/dL (ref 70–99)
Glucose-Capillary: 82 mg/dL (ref 70–99)

## 2012-08-25 LAB — BASIC METABOLIC PANEL
BUN: 22 mg/dL (ref 6–23)
CO2: 30 mEq/L (ref 19–32)
Calcium: 9.6 mg/dL (ref 8.4–10.5)
Chloride: 96 mEq/L (ref 96–112)
Creatinine, Ser: 1.23 mg/dL — ABNORMAL HIGH (ref 0.50–1.10)
GFR calc Af Amer: 54 mL/min — ABNORMAL LOW (ref >90)
GFR calc non Af Amer: 46 mL/min — ABNORMAL LOW (ref >90)
Glucose, Bld: 152 mg/dL — ABNORMAL HIGH (ref 70–99)
Potassium: 4.2 mEq/L (ref 3.5–5.1)
Sodium: 135 mEq/L (ref 135–145)

## 2012-08-25 LAB — CBC
HCT: 32 % — ABNORMAL LOW (ref 36.0–46.0)
Hemoglobin: 9.9 g/dL — ABNORMAL LOW (ref 12.0–15.0)
MCH: 24.3 pg — ABNORMAL LOW (ref 26.0–34.0)
MCHC: 30.9 g/dL (ref 30.0–36.0)
MCV: 78.4 fL (ref 78.0–100.0)
Platelets: 460 10*3/uL — ABNORMAL HIGH (ref 150–400)
RBC: 4.08 MIL/uL (ref 3.87–5.11)
RDW: 17.9 % — ABNORMAL HIGH (ref 11.5–15.5)
WBC: 6.2 10*3/uL (ref 4.0–10.5)

## 2012-08-25 NOTE — Progress Notes (Addendum)
1 Day Post-Op Procedure(s) (LRB): STERNAL WOUND DEBRIDEMENT (N/A)  Subjective: [name] [name] complains of some incisional pain and constipation this morning.  She is utilizing pain medications ordered, and they do provide relief.  I told the patient that she has things ordered already for her constipation she just has to ask the nurse for them. She also questions how long we think it will take her wound to heal and how long she will be in the hospital prior to leaving for rehab  Objective: Vital signs in last 24 hours: Temp:  [98.6 F (37 C)-98.9 F (37.2 C)] 98.9 F (37.2 C) (08/28 0529) Pulse Rate:  [72-88] 88  (08/28 0529) Cardiac Rhythm:  [-] Normal sinus rhythm (08/27 1930) Resp:  [15-22] 16  (08/28 0529) BP: (105-141)/(61-88) 105/88 mmHg (08/28 0529) SpO2:  [96 %-100 %] 96 % (08/28 0529)  Intake/Output from previous day: 08/27 0701 - 08/28 0700 In: 600 [I.V.:600] Out: 1620 [Urine:1600; Blood:20] Intake/Output this shift: Total I/O In: -  Out: 65 [Drains:65]  General appearance: alert, cooperative and no distress Heart: regular rate and rhythm Lungs: clear to auscultation bilaterally Abdomen: soft, non-tender; bowel sounds normal; no masses,  no organomegaly Extremities: edema trace Wound: wound vac in place  Lab Results:  Basename 08/25/12 0500 08/23/12 2030  WBC 6.2 7.6  HGB 9.9* 10.5*  HCT 32.0* 33.5*  PLT 460* 504*   BMET:  Basename 08/25/12 0500  NA 135  K 4.2  CL 96  CO2 30  GLUCOSE 152*  BUN 22  CREATININE 1.23*  CALCIUM 9.6    PT/INR: No results found for this basename: LABPROT,INR in the last 72 hours ABG    Component Value Date/Time   PHART 7.460* 08/16/2012 1330   HCO3 26.8* 08/16/2012 1330   TCO2 27.9 08/16/2012 1330   ACIDBASEDEF 1.0 07/09/2012 2154   O2SAT 94.7 08/16/2012 1330   CBG (last 3)   Basename 08/25/12 0634 08/24/12 2139 08/24/12 1524  GLUCAP 155* 187* 116*    Assessment/Plan: S/P Procedure(s) (LRB): STERNAL WOUND DEBRIDEMENT  (N/A)  1. Wound debrided yesterday with placement of Acell, will resume wound vac changes as previously scheduled 2. IV Abx- On Cipro since 8/23 ( Day 5)- will continue for now and transition to oral antibiotics when appropriate 3. LOC constipation- Lactulose, MOM ordered 4. Dispo- patient will go to SNF once appropriate   LOS: 9 days    [name] [name] 08/25/2012   patient examined and medical record reviewed,agree with above note.VAC change T-TH-SAT SNF when wound looks clean and no more surgical debridement needed [name] [name] 08/25/2012

## 2012-08-25 NOTE — Progress Notes (Signed)
CARDIAC REHAB PHASE I   PRE:  Rate/Rhythm: 81SR  BP:  Supine:   Sitting: 100/70  Standing:    SaO2: 94%RA  MODE:  Ambulation: 250 ft   POST:  Rate/Rhythem: 97  BP:  Supine:   Sitting: 110/70  Standing:    SaO2: 97%RA 1325-1350 Pt still did not want to walk. Stated belly full. Had not had BM yet. Told pt that DR.VANTRIGHT wanted her to walk and that walking may help with fullness. Pt walked 250 ft with rolling walker with steady gait. Tolerated well. To recliner with call bell. Gave pt coffee at her request.  Jeani Sow

## 2012-08-25 NOTE — Op Note (Signed)
NAMECIPRIANA, [name] [name] NO.:  [id number]  MEDICAL RECORD NO.:  [id number]  LOCATION:  2035                         FACILITY:  [facility]  PHYSICIAN:  Ivin Poot, M.D.  DATE OF BIRTH:  November 27, 1951  DATE OF PROCEDURE:  08/24/2012 DATE OF DISCHARGE:                              OPERATIVE REPORT   OPERATION:  Sternal debridement and wound VAC change.  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  PROCEDURE:  The patient was brought to the operative room for debridement of the inferior portion of her wound which had some necrotic fat after 5 days of wound VAC therapy.  The patient was brought directly to the OR where general anesthesia was induced.  The previous wound VAC sponge was removed and the chest was prepped and draped as a sterile field.  A proper time-out was performed.  Tubes, exposed wires were removed.  There was no purulent material.  There was some necrotic fat which was sharply debrided.  Pulse lavage 1 L of saline was then used to irrigate out the wound.  The wound appeared fairly clean at that point.  Next ACell powder was then placed in the depths of the wound.  Over the ACell powder, an ACell membrane was placed on the lower portion of the wound and tacked to the soft tissue with interrupted 3-0 Vicryl.  Over the Vicryl membrane, sterile lubricating gel was placed and then a similar sized piece of Adaptic.  Over this, a small wound VAC sponge was placed with the sterile Vi-Drape and a connecting tubing to the suction pump.  The patient was then reversed from anesthesia and returned to the recovery room in stable condition.     Ivin Poot, M.D.     PV/MEDQ  D:  08/24/2012  T:  08/25/2012   Job:  [id number]

## 2012-08-25 NOTE — Progress Notes (Signed)
1115 Offered to walk. Pt declined because of constipation. Told her walking helps constipation. Said she would walk later around 1300. Will followup as time permits. Ezzie Senat DunlapRN

## 2012-08-26 LAB — GLUCOSE, CAPILLARY
Glucose-Capillary: 112 mg/dL — ABNORMAL HIGH (ref 70–99)
Glucose-Capillary: 135 mg/dL — ABNORMAL HIGH (ref 70–99)
Glucose-Capillary: 95 mg/dL (ref 70–99)
Glucose-Capillary: 121 mg/dL — ABNORMAL HIGH (ref 70–99)

## 2012-08-26 NOTE — Progress Notes (Signed)
2 Days Post-Op Procedure(s) (LRB): STERNAL WOUND DEBRIDEMENT (N/A)  Subjective: [name] has no complaints this morning.  Objective: Vital signs in last 24 hours: Temp:  [98.4 F (36.9 C)-99.3 F (37.4 C)] 98.6 F (37 C) (08/29 0509) Pulse Rate:  [82-95] 84  (08/29 0509) Cardiac Rhythm:  [-] Normal sinus rhythm (08/28 1945) Resp:  [16-18] 16  (08/29 0509) BP: (102-118)/(68-72) 118/68 mmHg (08/29 0509) SpO2:  [95 %-99 %] 99 % (08/29 0509)  Intake/Output from previous day: 08/28 0701 - 08/29 0700 In: -  Out: 105 [Drains:105]  General appearance: alert, cooperative and no distress Heart: regular rate and rhythm Lungs: clear to auscultation bilaterally Abdomen: soft, non-tender; bowel sounds normal; no masses,  no organomegaly Extremities: edema trace Wound: wound vac in place  Lab Results:  Basename 08/25/12 0500 08/23/12 2030  WBC 6.2 7.6  HGB 9.9* 10.5*  HCT 32.0* 33.5*  PLT 460* 504*   BMET:  Basename 08/25/12 0500  NA 135  K 4.2  CL 96  CO2 30  GLUCOSE 152*  BUN 22  CREATININE 1.23*  CALCIUM 9.6    PT/INR: No results found for this basename: LABPROT,INR in the last 72 hours ABG    Component Value Date/Time   PHART 7.460* 08/16/2012 1330   HCO3 26.8* 08/16/2012 1330   TCO2 27.9 08/16/2012 1330   ACIDBASEDEF 1.0 07/09/2012 2154   O2SAT 94.7 08/16/2012 1330   CBG (last 3)   Basename 08/26/12 0605 08/25/12 2020 08/25/12 1638  GLUCAP 112* 82 75    Assessment/Plan: S/P Procedure(s) (LRB): STERNAL WOUND DEBRIDEMENT (N/A)  1. Sternal Wound Infection- wound vac in place, consulted wound care for vac changes 2. IV Abx- on Cipro for Klebsiella and Morganella  3. LOC Constipation- resolved 4. Dispo- continue wound care, once wound appropriate patient will be discharged to SNF    LOS: 10 days    Ahmed Prima, [name] 08/26/2012

## 2012-08-26 NOTE — Consult Note (Signed)
WOC consult Note Reason for Consult: Consult for sternal vac dressing.  Discussed plan of care with Dr Nils Pyle;  ACELL placed in [place] and he desires WOC to change dressing and he will assess wound on Fri at 1400. Wound type: Full thickness post-op sternal wound.  Vac intact with good seal at 73mm cont suction.  Supplies ordered to room and will change at time requested on Friday with physician.  Julien Girt, RN, MSN, [employer]  [phone]

## 2012-08-26 NOTE — Progress Notes (Signed)
CARDIAC REHAB PHASE I   PRE:  Rate/Rhythm: 85 SR  BP:  Supine:   Sitting: 92/52  Standing:    SaO2: 97 RA  MODE:  Ambulation: 500 ft   POST:  Rate/Rhythem: 103   BP:  Supine:   Sitting: 98/60  Standing:    SaO2: 95 RA 1305-1340 This is my second attempt to get pt to ambulate today, states I do not want to walk. Able to convince her. Assisted X 1 and used walker to ambulate. Gait steady with walker. VS stable. Pt to recliner after walk with call light in reach.  Deon Pilling

## 2012-08-27 LAB — GLUCOSE, CAPILLARY
Glucose-Capillary: 101 mg/dL — ABNORMAL HIGH (ref 70–99)
Glucose-Capillary: 153 mg/dL — ABNORMAL HIGH (ref 70–99)
Glucose-Capillary: 90 mg/dL (ref 70–99)
Glucose-Capillary: 194 mg/dL — ABNORMAL HIGH (ref 70–99)

## 2012-08-27 NOTE — Progress Notes (Signed)
3 Days Post-Op Procedure(s) (LRB): STERNAL WOUND DEBRIDEMENT (N/A)  Subjective: [name] [name] has no complaints this morning.   Objective: Vital signs in last 24 hours: Temp:  [97.7 F (36.5 C)-98.5 F (36.9 C)] 98.5 F (36.9 C) (08/30 0509) Pulse Rate:  [72-87] 87  (08/30 0509) Cardiac Rhythm:  [-] Normal sinus rhythm (08/29 2006) Resp:  [16-17] 16  (08/30 0509) BP: (105-130)/(68-86) 108/77 mmHg (08/30 0509) SpO2:  [93 %-95 %] 93 % (08/30 0509)  Intake/Output from previous day: 08/29 0701 - 08/30 0700 In: 360 [P.O.:360] Out: -   General appearance: alert, cooperative and no distress Heart: regular rate and rhythm Lungs: clear to auscultation bilaterally Abdomen: soft, non-tender; bowel sounds normal; no masses,  no organomegaly Extremities: edema trace Wound: wound vac in place  Lab Results:  Basename 08/25/12 0500  WBC 6.2  HGB 9.9*  HCT 32.0*  PLT 460*   BMET:  Basename 08/25/12 0500  NA 135  K 4.2  CL 96  CO2 30  GLUCOSE 152*  BUN 22  CREATININE 1.23*  CALCIUM 9.6    PT/INR: No results found for this basename: LABPROT,INR in the last 72 hours ABG    Component Value Date/Time   PHART 7.460* 08/16/2012 1330   HCO3 26.8* 08/16/2012 1330   TCO2 27.9 08/16/2012 1330   ACIDBASEDEF 1.0 07/09/2012 2154   O2SAT 94.7 08/16/2012 1330   CBG (last 3)   Basename 08/27/12 0608 08/26/12 2106 08/26/12 1632  GLUCAP 101* 121* 95    Assessment/Plan: S/P Procedure(s) (LRB): STERNAL WOUND DEBRIDEMENT (N/A)  1. Sternal wound infection- wound vac due to be changed today 2. IV ABX Cipro( Day 7) for Klebsiella and Morganella, possibly transition to oral medications? 3. Dispo- continue wound care, SNF once wound appropriate   LOS: 11 days    Prerana Strayer 08/27/2012

## 2012-08-27 NOTE — Progress Notes (Signed)
CARDIAC REHAB PHASE I   PRE:  Rate/Rhythm: 90 SR  BP:  Supine:   Sitting: 110/60  Standing:    SaO2: 95 RA  MODE:  Ambulation: 420 ft   POST:  Rate/Rhythem: 101  BP:  Supine:   Sitting: 110/60  Standing:  1510-1535  SaO2: 99 RA Assited X 1 and used walker to ambulate. Gait steady with walker. VS stable. Pt more cooperative with walking today. Pt to recliner after walk with call light in reach.    Deon Pilling

## 2012-08-27 NOTE — Consult Note (Signed)
WOC consult follow up Reason for Consult: VAC dressing change, pt with ACell  graft placed after last debridement Wound type: surgical/sternum S/P debridement and graft placement Measurement: 7cm x 5cm x 2cm  Wound bed: early granulation, Acell graft in wound bed with non adherent in place from OR Drainage (amount, consistency, odor) serosanguinous in canister Periwound: intact without problems Dressing procedure/placement/frequency: old non adherent removed, Acell uninterupted.  New layer of non adherent cut to fit and placed in wound bed, thick layer of hydrogel applied over non adherent.  NPWT VAC dressing reapplied, 1pc black granufoam cut to fit and placed in wound, drape applied and seal obtained at 46mmHG. Pt tolerated without problems.  Supplies for Monday dressing in the room.  WOC will manage VAC dressings per CVTS request Sanya Kobrin Lynita Lombard, [employer]

## 2012-08-27 NOTE — Progress Notes (Signed)
CSW spoke to pt while up and walking in hallway. Pt advised CSW that pt would possibly be d/c on Monday.  CSW advised pt that CSW would check back in on Monday in re: d/c plans. Nonnie Done, [place] [phone]  Clinical Social Work

## 2012-08-28 LAB — GLUCOSE, CAPILLARY
Glucose-Capillary: 88 mg/dL (ref 70–99)
Glucose-Capillary: 146 mg/dL — ABNORMAL HIGH (ref 70–99)
Glucose-Capillary: 105 mg/dL — ABNORMAL HIGH (ref 70–99)
Glucose-Capillary: 140 mg/dL — ABNORMAL HIGH (ref 70–99)

## 2012-08-28 NOTE — Progress Notes (Addendum)
4 Days Post-Op Procedure(s) (LRB): STERNAL WOUND DEBRIDEMENT (N/A)  Subjective: [name] [name] has no new complaints this morning.  She does states she no longer wishes to be discharged to rehab and would like to go home with home health. I told the patient that I will let Dr. Prescott Gum know about this decision and he can discuss her d/c options with her.  Objective: Vital signs in last 24 hours: Temp:  [98.3 F (36.8 C)-98.5 F (36.9 C)] 98.5 F (36.9 C) (08/30 2115) Pulse Rate:  [82-93] 82  (08/31 0403) Cardiac Rhythm:  [-] Normal sinus rhythm (08/30 2300) Resp:  [18] 18  (08/31 0403) BP: (110-120)/(66-77) 115/76 mmHg (08/31 0403) SpO2:  [97 %-98 %] 97 % (08/31 0403)  Intake/Output from previous day: 08/30 0701 - 08/31 0700 In: -  Out: 150 [Drains:150]  General appearance: alert, cooperative and no distress Heart: regular rate and rhythm Lungs: clear to auscultation bilaterally Abdomen: soft, non-tender; bowel sounds normal; no masses,  no organomegaly Extremities: edema trace Wound: wound vac in place  Lab Results: No results found for this basename: WBC:2,HGB:2,HCT:2,PLT:2 in the last 72 hours BMET: No results found for this basename: NA:2,K:2,CL:2,CO2:2,GLUCOSE:2,BUN:2,CREATININE:2,CALCIUM:2 in the last 72 hours  PT/INR: No results found for this basename: LABPROT,INR in the last 72 hours ABG    Component Value Date/Time   PHART 7.460* 08/16/2012 1330   HCO3 26.8* 08/16/2012 1330   TCO2 27.9 08/16/2012 1330   ACIDBASEDEF 1.0 07/09/2012 2154   O2SAT 94.7 08/16/2012 1330   CBG (last 3)   Basename 08/28/12 0610 08/27/12 2117 08/27/12 1608  GLUCAP 88 194* 90    Assessment/Plan: S/P Procedure(s) (LRB): STERNAL WOUND DEBRIDEMENT (N/A)  1. Sternal Wound infection- vac changed yesterday, per patient wound care states wound looked good, will re-assess on Monday 2. IV ABX- remains on Cipro (Day 8) for Klebsiella and Morganella coverage 3. Dispo- continue wound care, patient  no longer wishes to be discharged to rehab, prefers to go home.  If okay with Dr. Prescott Gum will need home wound vac nursing care.  Will discuss possible timing with staff   LOS: 12 days    [name] [name] 08/28/2012    Chart reviewed, patient examined, agree with above. Dr. Prescott Gum wants he VAC dressing changed by wound care nurse on Monday before going home.

## 2012-08-29 LAB — GLUCOSE, CAPILLARY
Glucose-Capillary: 108 mg/dL — ABNORMAL HIGH (ref 70–99)
Glucose-Capillary: 125 mg/dL — ABNORMAL HIGH (ref 70–99)
Glucose-Capillary: 80 mg/dL (ref 70–99)
Glucose-Capillary: 110 mg/dL — ABNORMAL HIGH (ref 70–99)

## 2012-08-29 MED ORDER — SODIUM CHLORIDE 0.9 % IV SOLN
INTRAVENOUS | Status: DC | PRN
  Administered 2012-08-29: 20 mL/h via INTRAVENOUS
  Administered 2012-09-02: 1000 mL via INTRAVENOUS
  Administered 2012-09-04: 20 mL/h via INTRAVENOUS
  Administered 2012-09-07: 04:00:00 via INTRAVENOUS
  Administered 2012-09-09: 900 mL via INTRAVENOUS
  Administered 2012-09-10: 1000 mL via INTRAVENOUS
  Administered 2012-09-12 – 2012-09-16 (×2): via INTRAVENOUS

## 2012-08-29 NOTE — Progress Notes (Addendum)
5 Days Post-Op Procedure(s) (LRB): STERNAL WOUND DEBRIDEMENT (N/A)  Subjective: Ms. Pincock has no complaints this morning.  Is hoping she will get to be discharged home soon  Objective: Vital signs in last 24 hours: Temp:  [98.9 F (37.2 C)-99.4 F (37.4 C)] 99.4 F (37.4 C) (09/01 0543) Pulse Rate:  [80-83] 80  (09/01 0543) Cardiac Rhythm:  [-] Normal sinus rhythm (09/01 0815) Resp:  [18] 18  (09/01 0543) BP: (116-128)/(68-79) 116/78 mmHg (09/01 0543) SpO2:  [95 %-97 %] 95 % (09/01 0543) Weight:  [247 lb (112.038 kg)] 247 lb (112.038 kg) (09/01 0543)  Intake/Output from previous day: 08/31 0701 - 09/01 0700 In: 720 [P.O.:720] Out: 1450 [Urine:1450] Intake/Output this shift: Total I/O In: 240 [P.O.:240] Out: -   General appearance: alert, cooperative and no distress Heart: regular rate and rhythm Lungs: clear to auscultation bilaterally Abdomen: soft, non-tender; bowel sounds normal; no masses,  no organomegaly Wound: wound vac in place  Lab Results: No results found for this basename: WBC:2,HGB:2,HCT:2,PLT:2 in the last 72 hours BMET: No results found for this basename: NA:2,K:2,CL:2,CO2:2,GLUCOSE:2,BUN:2,CREATININE:2,CALCIUM:2 in the last 72 hours  PT/INR: No results found for this basename: LABPROT,INR in the last 72 hours ABG    Component Value Date/Time   PHART 7.460* 08/16/2012 1330   HCO3 26.8* 08/16/2012 1330   TCO2 27.9 08/16/2012 1330   ACIDBASEDEF 1.0 07/09/2012 2154   O2SAT 94.7 08/16/2012 1330   CBG (last 3)   Basename 08/29/12 0547 08/28/12 2057 08/28/12 1608  GLUCAP 108* 140* 105*    Assessment/Plan: S/P Procedure(s) (LRB): STERNAL WOUND DEBRIDEMENT (N/A)  1. Sternal wound infection- wound vac due to be changed tomorrow 2. IV ABX- Cipro Day 9-+ Klebsiella, Morganella 3. Dispo- continue wound care, will discuss possible d/c home with Dr. Prescott Gum early next week    LOS: 13 days    BARRETT, [name] 08/29/2012    Chart reviewed, patient  examined, agree with above.

## 2012-08-30 LAB — GLUCOSE, CAPILLARY
Glucose-Capillary: 114 mg/dL — ABNORMAL HIGH (ref 70–99)
Glucose-Capillary: 124 mg/dL — ABNORMAL HIGH (ref 70–99)
Glucose-Capillary: 93 mg/dL (ref 70–99)
Glucose-Capillary: 113 mg/dL — ABNORMAL HIGH (ref 70–99)

## 2012-08-30 NOTE — Progress Notes (Signed)
After pt less than 50 % of her lunch she vomited, felt some better, given gingerale, will continue to followup, Pt states her last bowel movement on Thursday 8/29, abd soft, bowel sounds present. Will follow up on prn laxative.[name] [name]

## 2012-08-30 NOTE — Progress Notes (Signed)
Encouraged ambulation in hallway, pt declined at present [name] [name]

## 2012-08-30 NOTE — Consult Note (Addendum)
Wound care follow-up:  Sternal vac dressing changed.  Pt medicated prior to procedure and denies c/o pain.  Wound beefy red with Acell visible to wound bed.  Vac cannister with small tan drainage, no odor.  Mepitel applied, then generous amount of hydrogel, then one piece black foam.  Cont suction on at 75 mm.  Plan dressing change on Wed.  Julien Girt, RN, MSN, [employer]  [phone]

## 2012-08-30 NOTE — Progress Notes (Addendum)
6 Days Post-Op Procedure(s) (LRB): STERNAL WOUND DEBRIDEMENT (N/A)  Subjective: [name] [name] has no complaints this morning   Objective: Vital signs in last 24 hours: Temp:  [98.9 F (37.2 C)-99.1 F (37.3 C)] 98.9 F (37.2 C) (09/02 0353) Pulse Rate:  [71-84] 84  (09/02 0353) Cardiac Rhythm:  [-] Normal sinus rhythm (09/02 0756) Resp:  [18] 18  (09/02 0353) BP: (117-133)/(62-80) 133/62 mmHg (09/02 0353) SpO2:  [95 %-98 %] 96 % (09/02 0353) Weight:  [243 lb 6.4 oz (110.406 kg)] 243 lb 6.4 oz (110.406 kg) (09/02 0353)  Intake/Output from previous day: 09/01 0701 - 09/02 0700 In: 4965.3 [P.O.:720; I.V.:245.3; IV Piggyback:4000] Out: 1300 [Urine:1300]  General appearance: alert, cooperative and no distress Heart: regular rate and rhythm Lungs: clear to auscultation bilaterally Abdomen: soft, non-tender; bowel sounds normal; no masses,  no organomegaly Extremities: edema trace Wound: wound vac in place  Lab Results: No results found for this basename: WBC:2,HGB:2,HCT:2,PLT:2 in the last 72 hours BMET: No results found for this basename: NA:2,K:2,CL:2,CO2:2,GLUCOSE:2,BUN:2,CREATININE:2,CALCIUM:2 in the last 72 hours  PT/INR: No results found for this basename: LABPROT,INR in the last 72 hours ABG    Component Value Date/Time   PHART 7.460* 08/16/2012 1330   HCO3 26.8* 08/16/2012 1330   TCO2 27.9 08/16/2012 1330   ACIDBASEDEF 1.0 07/09/2012 2154   O2SAT 94.7 08/16/2012 1330   CBG (last 3)   Basename 08/30/12 0612 08/29/12 2038 08/29/12 1618  GLUCAP 114* 110* 80    Assessment/Plan: S/P Procedure(s) (LRB): STERNAL WOUND DEBRIDEMENT (N/A)  1 Sternal wound infection- wound vac to be changed today 2. IV ABX- On Cipro Day 10, +Klebsiella and Morganella 3. Dispo- continue wound care, discharge planning per Dr. Prescott [name]   LOS: 14 days    [name] [name] 08/30/2012   VAC dressing changed by wound care nurse.Wound sounds like it is clean. Discharge plans per PVT.

## 2012-08-31 LAB — COMPREHENSIVE METABOLIC PANEL
BUN: 28 mg/dL — ABNORMAL HIGH (ref 6–23)
CO2: 25 mEq/L (ref 19–32)
Calcium: 9.3 mg/dL (ref 8.4–10.5)
Chloride: 106 mEq/L (ref 96–112)
Creatinine, Ser: 1.13 mg/dL — ABNORMAL HIGH (ref 0.50–1.10)
GFR calc Af Amer: 60 mL/min — ABNORMAL LOW (ref >90)
GFR calc non Af Amer: 51 mL/min — ABNORMAL LOW (ref >90)
Glucose, Bld: 205 mg/dL — ABNORMAL HIGH (ref 70–99)
Total Bilirubin: 0.3 mg/dL (ref 0.3–1.2)

## 2012-08-31 LAB — TYPE AND SCREEN
ABO/RH(D): O POS
Antibody Screen: NEGATIVE

## 2012-08-31 LAB — CBC
Hemoglobin: 11.4 g/dL — ABNORMAL LOW (ref 12.0–15.0)
MCH: 27.1 pg (ref 26.0–34.0)
Platelets: 141 10*3/uL — ABNORMAL LOW (ref 150–400)
RBC: 4.21 MIL/uL (ref 3.87–5.11)
WBC: 5.6 10*3/uL (ref 4.0–10.5)

## 2012-08-31 LAB — GLUCOSE, CAPILLARY
Glucose-Capillary: 83 mg/dL (ref 70–99)
Glucose-Capillary: 120 mg/dL — ABNORMAL HIGH (ref 70–99)
Glucose-Capillary: 60 mg/dL — ABNORMAL LOW (ref 70–99)
Glucose-Capillary: 63 mg/dL — ABNORMAL LOW (ref 70–99)
Glucose-Capillary: 71 mg/dL (ref 70–99)
Glucose-Capillary: 135 mg/dL — ABNORMAL HIGH (ref 70–99)

## 2012-08-31 MED ORDER — FENTANYL CITRATE 0.05 MG/ML IJ SOLN: 50.0000 ug | INTRAMUSCULAR | Status: DC | PRN

## 2012-08-31 MED ORDER — GLUCERNA SHAKE PO LIQD
237.0000 mL | Freq: Two times a day (BID) | ORAL | Status: DC
  Administered 2012-08-31 – 2012-09-07 (×12): 237 mL via ORAL

## 2012-08-31 MED ORDER — MIDAZOLAM HCL 2 MG/2ML IJ SOLN: 1.0000 mg | INTRAMUSCULAR | Status: DC | PRN

## 2012-08-31 MED ORDER — LACTATED RINGERS IV SOLN
INTRAVENOUS | Status: DC
  Administered 2012-08-31: via INTRAVENOUS

## 2012-08-31 NOTE — Progress Notes (Signed)
INITIAL ADULT NUTRITION ASSESSMENT Date: 08/31/2012   Time: 3:47 PM  INTERVENTION:  Glucerna Shake twice daily (220 kcals, 9.9 gm protein per 8 fl oz bottle) RD to follow for nutrition care plan  Reason for Assessment: Health History, Wound  ASSESSMENT: Female 61 y.o.  Dx: Superficial sternal wound infection  Hx:  Past Medical History  Diagnosis Date  . Hypertension   . Diabetes mellitus     diagnosed in 2008  . Glaucoma   . Hyperlipidemia   . CVA (cerebral infarction)     right internal capsule stroke in 12/2006  . Left-sided sensory deficit present   . Cocaine abuse     crack cocaine heavily until 2008 then sporatic use since then  . Tobacco abuse   . Thyroid nodule     FNA in [id number] showed follicular cells but not definate neoplasm  . Coronary artery disease   . Myocardial infarction   . Anginal pain   . Stroke     Related Meds:     . aspirin  325 mg Oral Daily  . ciprofloxacin  400 mg Intravenous Q12H  . enoxaparin  40 mg Subcutaneous Q24H  . furosemide  40 mg Oral Daily  . glimepiride  4 mg Oral Q breakfast  . insulin aspart  0-20 Units Subcutaneous TID WC  . insulin aspart  0-5 Units Subcutaneous QHS  . insulin aspart  4 Units Subcutaneous TID WC  . metFORMIN  1,000 mg Oral BID  . metoprolol tartrate  25 mg Oral BID  . pantoprazole  40 mg Oral Q1200  . simvastatin  20 mg Oral q1800  . sodium chloride  10-40 mL Intracatheter Q12H    Ht: 5\' 9"  (175.3 cm)  Wt: 243 lb 6.4 oz (110.406 kg)  Ideal Wt: 65.9 kg % Ideal Wt: 167%  Usual Wt: 257 lb % Usual Wt: 95%  Body mass index is 35.94 kg/(m^2).  Food/Nutrition Related Hx: no triggers per admission nutrition screen  Labs:  CMP     Component Value Date/Time   NA 135 08/25/2012 0500   K 4.2 08/25/2012 0500   CL 96 08/25/2012 0500   CO2 30 08/25/2012 0500   GLUCOSE 152* 08/25/2012 0500   BUN 22 08/25/2012 0500   CREATININE 1.23* 08/25/2012 0500   CALCIUM 9.6 08/25/2012 0500   PROT 7.4 08/16/2012 1327   ALBUMIN 2.6* 08/16/2012 1327   AST 11 08/16/2012 1327   ALT 8 08/16/2012 1327   ALKPHOS 91 08/16/2012 1327   BILITOT 0.4 08/16/2012 1327   GFRNONAA 46* 08/25/2012 0500   GFRAA 54* 08/25/2012 0500     Intake/Output Summary (Last 24 hours) at 08/31/12 1548 Last data filed at 08/31/12 1300  Gross per 24 hour  Intake    720 ml  Output    401 ml  Net    319 ml    CBG (last 3)   Basename 08/31/12 1105 08/31/12 0613 08/30/12 2033  GLUCAP 120* 83 113*    Diet Order: Carb Control  Supplements/Tube Feeding: N/A  IVF:    DISCONTD: lactated ringers Last Rate: 20 mL/hr at 08/31/12 1018    Estimated Nutritional Needs:   Kcal: 1800-2000 Protein: 95-105 Fluid: 1.8-2.0 L  Patient s/p sternal wound debridement, placement of wound VAC 8/20; 2nd sternal debridement and wound VAC change 8/27; patient states her appetite has been decreased; PO intake 75-100% per flowsheet records; no unintentional weight loss reported; states she is going back to OR tomorrow for further debridement; RD  feels patient is not meeting her protein needs; patient amenable to trying Glucerna Shake -- will order.  NUTRITION DIAGNOSIS: -Increased nutrient needs (NI-5.1).  Status: Ongoing  RELATED TO: wound healing  AS EVIDENCE BY: estimated nutrition needs  MONITORING/EVALUATION(Goals): Goal: Oral intake with meals & supplements to meet >/= 90% of estimated nutrition needs Monitor: PO & supplemental intake, weight, labs, I/O's  EDUCATION NEEDS: -No education needs identified at this time  Dietitian #: [place] Per approved criteria  -Obesity Unspecified    Lady Deutscher 08/31/2012, 3:47 PM

## 2012-08-31 NOTE — Progress Notes (Signed)
Suction pressure on VAC increased to 125 mm per verbal order from MD.

## 2012-08-31 NOTE — Progress Notes (Signed)
[employer] with pt at 1410 to walk. Said that her wound had been debrided and she needed pain med. Asked pt's RN to give pain med. Pt said she might go at 3pm. Checked with pt now and she states she still cannot go. Encouraged pt to go with pt's RN later. Kiante Petrovich DunlapRN

## 2012-08-31 NOTE — Consult Note (Signed)
Wound care follow-up:  Dr Lucianne Lei Tright in to remove vac earlier and assess wound.  Requested to re-apply dressing.  Mepitel applied, generous amt hydrogel, and one piece black sponge at 74mm cont suction.  Pt medicated prior to procedure and tolerated with minimal discomfort.  Inner wound bed appears beefy red with Acell visible.  Pt states Dr Lucianne Lei Tright plans to take her back to the OR tomorrow to debride inner wound bed further.  Will resume vac dressing changes when requested after surgery.  Julien Girt, RN, MSN, [employer]  [phone]

## 2012-08-31 NOTE — Discharge Summary (Signed)
patient examined and medical record reviewed,agree with above note. [name],[name] [name] 08/31/2012

## 2012-08-31 NOTE — Progress Notes (Addendum)
                    [address]            [place] [address]          [phone]     7 Days Post-Op Procedure(s) (LRB): STERNAL WOUND DEBRIDEMENT (N/A)  Subjective: Nausea and vomiting last night, but feels better after +BM this morning.  Worried about being able to pay her rent and wants to go home.   Objective: Vital signs in last 24 hours: Patient Vitals for the past 24 hrs:  BP Temp Temp src Pulse Resp SpO2  08/30/12 2032 123/78 mmHg - Oral 79  19  98 %  08/30/12 1324 96/79 mmHg 98.7 F (37.1 C) Oral 87  18  100 %  08/30/12 0956 129/67 mmHg - - 82  - -   Current Weight  08/30/12 243 lb 6.4 oz (110.406 kg)     Intake/Output from previous day: 09/02 0701 - 09/03 0700 In: 480 [P.O.:480] Out: 801 [Urine:800; Stool:1]    PHYSICAL EXAM:  Heart: RRR Lungs: Clear Wound: Sternal VAC in place, no surrounding erythema     Lab Results: CBC:No results found for this basename: WBC:2,HGB:2,HCT:2,PLT:2 in the last 72 hours BMET: No results found for this basename: NA:2,K:2,CL:2,CO2:2,GLUCOSE:2,BUN:2,CREATININE:2,CALCIUM:2 in the last 72 hours  PT/INR: No results found for this basename: LABPROT,INR in the last 72 hours    Assessment/Plan: S/P Procedure(s) (LRB): STERNAL WOUND DEBRIDEMENT (N/A)  Wound VAC change in am.  ID- Cipro D#11 for Klebsiella and Morganella.  Disp- pt wants to go home today to pay her rent.  I will discuss with MD, but I doubt she will be able to go home. Will have the CSW see if she can assist in the matter, possibly get her an extension? The original d/c plan was for her to go to SNF when medically stable, and this is probably still the best option.    LOS: 15 days    COLLINS,GINA H 08/31/2012   wound examined @ bedside A-Cell membrane has been disrupted from the depth of the wound which hasnot completely granulated and has some exposed bone  Wound needs intraoperative debridement sched for am then further in-  hospital care

## 2012-09-01 ENCOUNTER — Encounter: Payer: Medicaid Other | Admitting: *Deleted

## 2012-09-01 ENCOUNTER — Encounter (HOSPITAL_COMMUNITY)
Admission: AD | Disposition: A | Discharge status: Skilled Nursing Facility | Payer: Self-pay | Source: Ambulatory Visit | Attending: Cardiothoracic Surgery

## 2012-09-01 ENCOUNTER — Encounter (HOSPITAL_COMMUNITY): Payer: Self-pay | Admitting: Anesthesiology

## 2012-09-01 ENCOUNTER — Inpatient Hospital Stay (HOSPITAL_COMMUNITY): Payer: Medicaid Other | Admitting: Anesthesiology

## 2012-09-01 DIAGNOSIS — L02219 Cutaneous abscess of trunk, unspecified: Secondary | ICD-10-CM

## 2012-09-01 DIAGNOSIS — L03319 Cellulitis of trunk, unspecified: Secondary | ICD-10-CM

## 2012-09-01 HISTORY — PX: STERNAL WOUND DEBRIDEMENT: SHX1058

## 2012-09-01 LAB — GLUCOSE, CAPILLARY
Glucose-Capillary: 85 mg/dL (ref 70–99)
Glucose-Capillary: 80 mg/dL (ref 70–99)
Glucose-Capillary: 78 mg/dL (ref 70–99)
Glucose-Capillary: 98 mg/dL (ref 70–99)
Glucose-Capillary: 112 mg/dL — ABNORMAL HIGH (ref 70–99)

## 2012-09-01 SURGERY — DEBRIDEMENT, WOUND, STERNUM
Anesthesia: General | Site: Chest | Wound class: Clean

## 2012-09-01 MED ORDER — LIDOCAINE HCL (CARDIAC) 20 MG/ML IV SOLN
INTRAVENOUS | Status: DC | PRN
  Administered 2012-09-01: 100 mg via INTRAVENOUS

## 2012-09-01 MED ORDER — PROPOFOL 10 MG/ML IV BOLUS
INTRAVENOUS | Status: DC | PRN
  Administered 2012-09-01: 150 mg via INTRAVENOUS

## 2012-09-01 MED ORDER — 0.9 % SODIUM CHLORIDE (POUR BTL) OPTIME
TOPICAL | Status: DC | PRN
  Administered 2012-09-01: 1000 mL

## 2012-09-01 MED ORDER — NEOSTIGMINE METHYLSULFATE 1 MG/ML IJ SOLN
INTRAMUSCULAR | Status: DC | PRN
  Administered 2012-09-01: 2 mg via INTRAVENOUS
  Administered 2012-09-01: 3 mg via INTRAVENOUS

## 2012-09-01 MED ORDER — GLYCOPYRROLATE 0.2 MG/ML IJ SOLN
INTRAMUSCULAR | Status: DC | PRN
  Administered 2012-09-01: 0.3 mg via INTRAVENOUS
  Administered 2012-09-01: 0.4 mg via INTRAVENOUS

## 2012-09-01 MED ORDER — ROCURONIUM BROMIDE 100 MG/10ML IV SOLN
INTRAVENOUS | Status: DC | PRN
  Administered 2012-09-01: 50 mg via INTRAVENOUS

## 2012-09-01 MED ORDER — FENTANYL CITRATE 0.05 MG/ML IJ SOLN: 50.0000 ug | INTRAMUSCULAR | Status: DC | PRN

## 2012-09-01 MED ORDER — FENTANYL CITRATE 0.05 MG/ML IJ SOLN
INTRAMUSCULAR | Status: DC | PRN
  Administered 2012-09-01 (×2): 50 ug via INTRAVENOUS

## 2012-09-01 MED ORDER — CEFUROXIME SODIUM 1.5 G IJ SOLR
INTRAMUSCULAR | Status: AC
  Filled 2012-09-01: qty 1.5

## 2012-09-01 MED ORDER — MIDAZOLAM HCL 2 MG/2ML IJ SOLN: 1.0000 mg | INTRAMUSCULAR | Status: DC | PRN

## 2012-09-01 MED ORDER — HYDROMORPHONE HCL PF 1 MG/ML IJ SOLN: 0.2500 mg | INTRAMUSCULAR | Status: DC | PRN

## 2012-09-01 MED ORDER — PROMETHAZINE HCL 25 MG/ML IJ SOLN: 6.2500 mg | INTRAMUSCULAR | Status: DC | PRN

## 2012-09-01 MED ORDER — CEFUROXIME SODIUM 1.5 G IJ SOLR
1.5000 g | INTRAMUSCULAR | Status: DC | PRN
  Administered 2012-09-01: 1.5 g via INTRAVENOUS

## 2012-09-01 MED ORDER — LACTATED RINGERS IV SOLN
INTRAVENOUS | Status: DC | PRN
  Administered 2012-09-01: 08:00:00 via INTRAVENOUS

## 2012-09-01 MED ORDER — DEXTROSE 5 % IV SOLN
INTRAVENOUS | Status: AC
  Filled 2012-09-01: qty 50

## 2012-09-01 SURGICAL SUPPLY — 59 items
APL SKNCLS STERI-STRIP NONHPOA (GAUZE/BANDAGES/DRESSINGS)
ATTRACTOMAT 16X20 MAGNETIC DRP (DRAPES) ×3 IMPLANT
BAG DECANTER FOR FLEXI CONT (MISCELLANEOUS) ×3 IMPLANT
BANDAGE GAUZE ELAST BULKY 4 IN (GAUZE/BANDAGES/DRESSINGS) IMPLANT
BENZOIN TINCTURE PRP APPL 2/3 (GAUZE/BANDAGES/DRESSINGS) IMPLANT
BLADE SURG 10 STRL SS (BLADE) ×3 IMPLANT
CANISTER SUCTION 2500CC (MISCELLANEOUS) ×3 IMPLANT
CATH FOLEY 2WAY SLVR  5CC 16FR (CATHETERS)
CATH FOLEY 2WAY SLVR 5CC 16FR (CATHETERS) IMPLANT
CATH THORACIC 28FR RT ANG (CATHETERS) IMPLANT
CATH THORACIC 36FR (CATHETERS) IMPLANT
CATH THORACIC 36FR RT ANG (CATHETERS) IMPLANT
CLIP TI WIDE RED SMALL 24 (CLIP) IMPLANT
CLOTH BEACON ORANGE TIMEOUT ST (SAFETY) ×3 IMPLANT
CONN Y 3/8X3/8X3/8  BEN (MISCELLANEOUS)
CONN Y 3/8X3/8X3/8 BEN (MISCELLANEOUS) IMPLANT
CONT SPEC 4OZ CLIKSEAL STRL BL (MISCELLANEOUS) IMPLANT
COVER SURGICAL LIGHT HANDLE (MISCELLANEOUS) ×6 IMPLANT
DRAPE LAPAROSCOPIC ABDOMINAL (DRAPES) ×3 IMPLANT
DRAPE WARM FLUID 44X44 (DRAPE) IMPLANT
DRSG ADAPTIC 3X8 NADH LF (GAUZE/BANDAGES/DRESSINGS) ×2 IMPLANT
DRSG PAD ABDOMINAL 8X10 ST (GAUZE/BANDAGES/DRESSINGS) IMPLANT
DRSG VAC ATS SM SENSATRAC (GAUZE/BANDAGES/DRESSINGS) ×2 IMPLANT
ELECT REM PT RETURN 9FT ADLT (ELECTROSURGICAL) ×3
ELECTRODE REM PT RTRN 9FT ADLT (ELECTROSURGICAL) ×1 IMPLANT
GAUZE XEROFORM 5X9 LF (GAUZE/BANDAGES/DRESSINGS) IMPLANT
GLOVE BIO SURGEON STRL SZ7.5 (GLOVE) ×6 IMPLANT
GOWN STRL NON-REIN LRG LVL3 (GOWN DISPOSABLE) ×12 IMPLANT
HANDPIECE INTERPULSE COAX TIP (DISPOSABLE)
HEMOSTAT POWDER SURGIFOAM 1G (HEMOSTASIS) IMPLANT
HEMOSTAT SURGICEL 2X14 (HEMOSTASIS) IMPLANT
KIT BASIN OR (CUSTOM PROCEDURE TRAY) ×3 IMPLANT
KIT ROOM TURNOVER OR (KITS) ×3 IMPLANT
KIT SUCTION CATH 14FR (SUCTIONS) IMPLANT
MICROMATRIX 500MG (Tissue) ×3 IMPLANT
NS IRRIG 1000ML POUR BTL (IV SOLUTION) ×3 IMPLANT
PACK CHEST (CUSTOM PROCEDURE TRAY) ×3 IMPLANT
PAD ARMBOARD 7.5X6 YLW CONV (MISCELLANEOUS) ×6 IMPLANT
SET HNDPC FAN SPRY TIP SCT (DISPOSABLE) IMPLANT
SOLUTION BETADINE 4OZ (MISCELLANEOUS) IMPLANT
SOLUTION PARTIC MCRMTRX 500MG (Tissue) IMPLANT
SPONGE GAUZE 4X4 12PLY (GAUZE/BANDAGES/DRESSINGS) ×3 IMPLANT
SPONGE LAP 18X18 X RAY DECT (DISPOSABLE) ×3 IMPLANT
STAPLER VISISTAT 35W (STAPLE) IMPLANT
STRAP MONTGOMERY 1.25X11-1/8 (MISCELLANEOUS) IMPLANT
SUT ETHILON 3 0 FSL (SUTURE) IMPLANT
SUT STEEL 6MS V (SUTURE) IMPLANT
SUT STEEL STERNAL CCS#1 18IN (SUTURE) IMPLANT
SUT STEEL SZ 6 DBL 3X14 BALL (SUTURE) IMPLANT
SUT VIC AB 1 CTX 36 (SUTURE) ×6
SUT VIC AB 1 CTX36XBRD ANBCTR (SUTURE) ×2 IMPLANT
SUT VIC AB 2-0 CTX 27 (SUTURE) ×6 IMPLANT
SUT VIC AB 3-0 X1 27 (SUTURE) ×6 IMPLANT
SWAB COLLECTION DEVICE MRSA (MISCELLANEOUS) IMPLANT
SYR 5ML LL (SYRINGE) IMPLANT
TOWEL OR 17X24 6PK STRL BLUE (TOWEL DISPOSABLE) ×3 IMPLANT
TOWEL OR 17X26 10 PK STRL BLUE (TOWEL DISPOSABLE) ×3 IMPLANT
TUBE ANAEROBIC SPECIMEN COL (MISCELLANEOUS) IMPLANT
WATER STERILE IRR 1000ML POUR (IV SOLUTION) ×3 IMPLANT

## 2012-09-01 NOTE — Anesthesia Preprocedure Evaluation (Addendum)
Anesthesia Evaluation  Patient identified by MRN, date of birth, ID band Patient awake    Reviewed: Allergy & Precautions, H&P , NPO status , Patient's Chart, lab work & pertinent test results  Airway Mallampati: I TM Distance: >3 FB Neck ROM: full    Dental  (+) Edentulous Upper and Edentulous Lower   Pulmonary Current Smoker,    + decreased breath sounds      Cardiovascular hypertension, Pt. on medications + angina with exertion + CAD, + Past MI and + CABG Rhythm:regular Rate:Normal     Neuro/Psych CVA, Residual Symptoms    GI/Hepatic negative GI ROS, (+)     substance abuse  cocaine use,   Endo/Other  Type 2, Oral Hypoglycemic Agents  Renal/GU Renal InsufficiencyRenal disease     Musculoskeletal negative musculoskeletal ROS (+)   Abdominal   Peds  Hematology negative hematology ROS (+)   Anesthesia Other Findings Hx of Drug Abuse  Reproductive/Obstetrics                        Anesthesia Physical Anesthesia Plan  ASA: III  Anesthesia Plan: General   Post-op Pain Management:    Induction: Intravenous  Airway Management Planned: Oral ETT  Additional Equipment:   Intra-op Plan:   Post-operative Plan: Extubation in OR  Informed Consent: I have reviewed the patients History and Physical, chart, labs and discussed the procedure including the risks, benefits and alternatives for the proposed anesthesia with the patient or authorized representative who has indicated his/her understanding and acceptance.   Dental advisory given  Plan Discussed with: Surgeon and CRNA  Anesthesia Plan Comments:        Anesthesia Quick Evaluation

## 2012-09-01 NOTE — Progress Notes (Signed)
Inpatient Diabetes Program Recommendations  AACE/ADA: New Consensus Statement on Inpatient Glycemic Control (2013)  Target Ranges:  Prepandial:   less than 140 mg/dL      Peak postprandial:   less than 180 mg/dL (1-2 hours)      Critically ill patients:  140 - 180 mg/dL   Reason for Visit: CBG's less than 100 mg/dL.  Consider holding oral diabetes agents.  Also hold Novolog meal coverage and decrease correction to moderate.  Will follow.

## 2012-09-01 NOTE — Progress Notes (Signed)
1500-1510 Cardiac Rehab Came at 1400 to ambulate pt., she declines saying that she was in pain. Pt's RN gave pain medication and she told me come back at 1500.  Now pt refuses, pt sleepy.

## 2012-09-01 NOTE — Preoperative (Signed)
Beta Blockers   Reason not to administer Beta Blockers:Not Applicable 

## 2012-09-01 NOTE — Progress Notes (Signed)
Pt in procedure this am.  CSW called SNF- [facility] to update them on pt status and to confirm bed availability upon d/c.  SNF continues to have bed available for pt upon d/c. Nonnie Done, [place] [phone]  Clinical Social Work

## 2012-09-01 NOTE — Anesthesia Postprocedure Evaluation (Signed)
  Anesthesia Post-op Note  Patient: [name] [name]  Procedure(s) Performed: Procedure(s) (LRB) with comments: STERNAL WOUND DEBRIDEMENT (N/A)  Patient Location: PACU  Anesthesia Type: General  Level of Consciousness: awake and alert   Airway and Oxygen Therapy: Patient Spontanous Breathing  Post-op Pain: mild  Post-op Assessment: Post-op Vital signs reviewed, Patient's Cardiovascular Status Stable, Respiratory Function Stable, Patent Airway, No signs of Nausea or vomiting and Pain level controlled  Post-op Vital Signs: stable  Complications: No apparent anesthesia complications

## 2012-09-01 NOTE — Brief Op Note (Signed)
08/16/2012 - 09/01/2012  9:45 AM  PATIENT:  [name] [name]  61 y.o. female  PRE-OPERATIVE DIAGNOSIS:  superficial sternal wound  POST-OPERATIVE DIAGNOSIS:  sternal wound infection  PROCEDURE:  Procedure(s) (LRB) with comments: STERNAL WOUND DEBRIDEMENT (N/A)  SURGEON:  Surgeon(s) and Role:    * Ivin Poot, MD - Primary  PHYSICIAN ASSISTANT: 0  ASSISTANTS:0  ANESTHESIA:   general  EBL:  Total I/O In: 0  Out: 860 [Urine:850; Blood:10]  BLOOD ADMINISTERED:0  DRAINS: VAC   LOCAL MEDICATIONS USED:  0   SPECIMEN:  0  DISPOSITION OF SPECIMEN:  NA  COUNTS:  correct  TOURNIQUET:  0  DICTATION: .pend  PLAN OF CARE: Admit to inpatient   PATIENT DISPOSITION:  PACU - hemodynamically stable.   Delay start of Pharmacological VTE agent (>24hrs) due to surgical blood loss or risk of bleeding: yes A

## 2012-09-01 NOTE — Transfer of Care (Signed)
Immediate Anesthesia Transfer of Care Note  Patient: [name] [name]  Procedure(s) Performed: Procedure(s) (LRB) with comments: STERNAL WOUND DEBRIDEMENT (N/A)  Patient Location: PACU  Anesthesia Type: General  Level of Consciousness: awake, alert  and oriented  Airway & Oxygen Therapy: Patient Spontanous Breathing and Patient connected to nasal cannula oxygen  Post-op Assessment: Report given to PACU RN, Post -op Vital signs reviewed and stable and Patient moving all extremities  Post vital signs: Reviewed and stable  Complications: No apparent anesthesia complications

## 2012-09-02 ENCOUNTER — Encounter: Payer: Medicaid Other | Admitting: Cardiology

## 2012-09-02 ENCOUNTER — Encounter (HOSPITAL_COMMUNITY): Payer: Self-pay | Admitting: Cardiothoracic Surgery

## 2012-09-02 LAB — GLUCOSE, CAPILLARY
Glucose-Capillary: 82 mg/dL (ref 70–99)
Glucose-Capillary: 73 mg/dL (ref 70–99)
Glucose-Capillary: 62 mg/dL — ABNORMAL LOW (ref 70–99)
Glucose-Capillary: 85 mg/dL (ref 70–99)
Glucose-Capillary: 79 mg/dL (ref 70–99)

## 2012-09-02 LAB — BASIC METABOLIC PANEL
BUN: 16 mg/dL (ref 6–23)
CO2: 26 mEq/L (ref 19–32)
Calcium: 9.4 mg/dL (ref 8.4–10.5)
Chloride: 100 mEq/L (ref 96–112)
Creatinine, Ser: 1.15 mg/dL — ABNORMAL HIGH (ref 0.50–1.10)
GFR calc Af Amer: 58 mL/min — ABNORMAL LOW (ref >90)
GFR calc non Af Amer: 50 mL/min — ABNORMAL LOW (ref >90)
Glucose, Bld: 59 mg/dL — ABNORMAL LOW (ref 70–99)
Potassium: 3.6 mEq/L (ref 3.5–5.1)
Sodium: 139 mEq/L (ref 135–145)

## 2012-09-02 LAB — CBC
HCT: 33.9 % — ABNORMAL LOW (ref 36.0–46.0)
Hemoglobin: 10.6 g/dL — ABNORMAL LOW (ref 12.0–15.0)
MCH: 24.7 pg — ABNORMAL LOW (ref 26.0–34.0)
MCHC: 31.3 g/dL (ref 30.0–36.0)
MCV: 78.8 fL (ref 78.0–100.0)
Platelets: 429 10*3/uL — ABNORMAL HIGH (ref 150–400)
RBC: 4.3 MIL/uL (ref 3.87–5.11)
RDW: 18.2 % — ABNORMAL HIGH (ref 11.5–15.5)
WBC: 6.5 10*3/uL (ref 4.0–10.5)

## 2012-09-02 LAB — WOUND CULTURE: Culture: NO GROWTH

## 2012-09-02 NOTE — Progress Notes (Signed)
Pt refused ambulation. Sts she is constipated and not going anywhere until she is relieved. Attempted to convince pt that ambulating is good for constipation. She still refuses.  Yves Dill CES, ACSM

## 2012-09-02 NOTE — Progress Notes (Addendum)
1 Day Post-Op Procedure(s) (LRB): STERNAL WOUND DEBRIDEMENT (N/A)  Subjective: [name] [name] complains of pain this morning.  States she has been up since 3, due to pain in her wound.   Objective: Vital signs in last 24 hours: Temp:  [97.6 F (36.4 C)-98.4 F (36.9 C)] 98.4 F (36.9 C) (09/05 0357) Pulse Rate:  [75-94] 94  (09/05 0357) Cardiac Rhythm:  [-] Normal sinus rhythm (09/05 0357) Resp:  [11-18] 18  (09/05 0357) BP: (108-145)/(58-83) 108/74 mmHg (09/05 0357) SpO2:  [92 %-100 %] 92 % (09/05 0357) Weight:  [257 lb 1.6 oz (116.62 kg)] 257 lb 1.6 oz (116.62 kg) (09/05 0357)  Intake/Output from previous day: 09/04 0701 - 09/05 0700 In: 710 [I.V.:660; IV Piggyback:50] Out: 920 [Urine:850; Drains:10; Blood:60]  General appearance: alert, cooperative and no distress Heart: regular rate and rhythm Lungs: clear to auscultation bilaterally Abdomen: soft, non-tender; bowel sounds normal; no masses,  no organomegaly Extremities: edema trace Wound: clean and dry  Lab Results:  [name] 09/02/12 0516 08/31/12 2134  WBC 6.5 5.6  HGB 10.6* 11.4*  HCT 33.9* 35.3*  PLT 429* 141*   BMET:  Basename 09/02/12 0516 08/31/12 2134  NA 139 141  K 3.6 4.4  CL 100 106  CO2 26 25  GLUCOSE 59* 205*  BUN 16 28*  CREATININE 1.15* 1.13*  CALCIUM 9.4 9.3    PT/INR: No results found for this basename: LABPROT,INR in the last 72 hours ABG    Component Value Date/Time   PHART 7.460* 08/16/2012 1330   HCO3 26.8* 08/16/2012 1330   TCO2 27.9 08/16/2012 1330   ACIDBASEDEF 1.0 07/09/2012 2154   O2SAT 94.7 08/16/2012 1330   CBG (last 3)   Basename 09/02/12 0601 09/01/12 2036 09/01/12 1626  GLUCAP 82 112* 98    Assessment/Plan: S/P Procedure(s) (LRB): STERNAL WOUND DEBRIDEMENT (N/A)  1. Sternal wound infection-cont current wound care 2. +Klebsiella and Morganella- on Cipro Day 13 3. Dispo- wound vac care every MWF, patient needs further inpatient wound care, will address discharge once  appropriate   LOS: 17 days    BARRETT, ERIN 09/02/2012   Will change VAC Fri and place more A-Cell powder. patient examined and medical record reviewed,agree with above note. [name],Zhaniya Swallows 09/02/2012

## 2012-09-02 NOTE — Op Note (Signed)
NAMESICILIA, TWIGGS NO.:  [id number]  MEDICAL RECORD NO.:  [id number]  LOCATION:  2035                         FACILITY:  [facility]  PHYSICIAN:  Ivin Poot, M.D.  DATE OF BIRTH:  Mar 29, 1951  DATE OF PROCEDURE:  09/01/2012 DATE OF DISCHARGE:                              OPERATIVE REPORT   OPERATION: 1. Superficial sternal wound debridement. 2. Replacement of wound VAC sponge. 3. Placement of A-Cell powder in wound.  PREOPERATIVE DIAGNOSIS:  Superficial sternal wound infection, status post coronary artery bypass graft in a poorly-controlled diabetic substance abuser with obesity.  POSTOPERATIVE DIAGNOSIS:  Superficial sternal wound infection, status post coronary artery bypass graft in a poorly-controlled diabetic substance abuser with obesity.  SURGEON:  Ivin Poot, MD  ANESTHESIA:  General.  PROCEDURE:  The patient was brought to the operating room and general anesthesia was induced.  The chest was prepped and draped as a sterile field after the previously placed VAC sponge was removed.  A proper time- out was performed.  The wound was sharply debrided and some exposed sternal bone was removed with a rongeur.  There was no purulence.  There was no deep sinus tracts.  One sternal wire was visible and was removed. One liter of saline was then used in the pulse lavage irrigation system. After this, the tissue looked fairly clean and good granulation tissue was present.  A-Cell powder was placed over the exposed bone and the superior depths of the wound.  Adaptic gauze was placed over the A-Cell and over that, a small wound VAC sponge.  The sterile OpSite sheaths were placed and the suction catheter was placed and connected to suck Vacuum canister.  The patient was reversed and returned to the recovery room.     Ivin Poot, M.D.     PV/MEDQ  D:  09/01/2012  T:  09/02/2012  Job:  [id number]

## 2012-09-02 NOTE — Progress Notes (Signed)
HPI: 61 year old female for followup of coronary artery disease. Patient admitted to [facility] in July of 2013 with chest pain. This was in the setting of cocaine use. She will in for myocardial infarction with serial enzymes. Patient underwent cardiac catheterization which revealed severe three-vessel coronary disease. Ejection fraction was 25% with anterior, lateral and apical akinesis. Echocardiogram in July of 2013 showed an ejection fraction of 55%. Patient had coronary artery bypassing graft on 07/09/2012. She had a LIMA to the LAD, saphenous vein graft to the obtuse marginal and saphenous vein graft to the distal circumflex.  No current facility-administered medications for this visit.   Current Outpatient Prescriptions  Medication Sig Dispense Refill  . ciprofloxacin (CIPRO) 400 MG/200ML SOLN Inject 200 mLs (400 mg total) into the vein every 12 (twelve) hours. Please administer for 3 weeks  2000 mL  1  . metoprolol tartrate (LOPRESSOR) 25 MG tablet Take 1 tablet (25 mg total) by mouth 2 (two) times daily.      Marland Kitchen oxyCODONE 10 MG TABS Take 1 tablet (10 mg total) by mouth every 4 (four) hours as needed for pain.  30 tablet  0   Facility-Administered Medications Ordered in Other Visits  Medication Dose Route Frequency Provider Last Rate Last Dose  . 0.9 %  sodium chloride infusion   Intravenous PRN Lodema Hong Barrett, PA 20 mL/hr at 09/02/12 0353 1,000 mL at 09/02/12 0353  . acetaminophen (TYLENOL) tablet 650 mg  650 mg Oral Q6H PRN Ivin Poot, MD      . aspirin tablet 325 mg  325 mg Oral Daily Ivin Poot, MD   325 mg at 09/01/12 1206  . ciprofloxacin (CIPRO) IVPB 400 mg  400 mg Intravenous Q12H Ivin Poot, MD   400 mg at 09/02/12 0739  . enoxaparin (LOVENOX) injection 40 mg  40 mg Subcutaneous Q24H Ivin Poot, MD   40 mg at 08/31/12 1013  . feeding supplement (GLUCERNA SHAKE) liquid 237 mL  237 mL Oral BID Rogue Bussing, RD   237 mL at 09/01/12 1644  .  furosemide (LASIX) tablet 40 mg  40 mg Oral Daily Ivin Poot, MD   40 mg at 09/01/12 1207  . glimepiride (AMARYL) tablet 4 mg  4 mg Oral Q breakfast Nani Skillern, PA   4 mg at 09/01/12 1206  . insulin aspart (novoLOG) injection 0-20 Units  0-20 Units Subcutaneous TID WC Ivin Poot, MD   3 Units at 08/29/12 1302  . insulin aspart (novoLOG) injection 0-5 Units  0-5 Units Subcutaneous QHS Ivin Poot, MD   2 Units at 08/20/12 2143  . insulin aspart (novoLOG) injection 4 Units  4 Units Subcutaneous TID WC Ivin Poot, MD   4 Units at 09/01/12 1205  . lactulose (CHRONULAC) 10 GM/15ML solution 30 g  30 g Oral Daily PRN Ivin Poot, MD   30 g at 08/30/12 1444  . magnesium hydroxide (MILK OF MAGNESIA) suspension 30 mL  30 mL Oral BID PRN Erin R Barrett, PA   30 mL at 08/28/12 0413  . metFORMIN (GLUCOPHAGE) tablet 1,000 mg  1,000 mg Oral BID Nani Skillern, PA   1,000 mg at 09/01/12 2200  . metoprolol tartrate (LOPRESSOR) tablet 25 mg  25 mg Oral BID Nani Skillern, PA   25 mg at 09/01/12 2200  . oxyCODONE (Oxy IR/ROXICODONE) immediate release tablet 10 mg  10 mg Oral Q3H PRN Ivin Poot, MD  10 mg at 09/02/12 0459  . pantoprazole (PROTONIX) EC tablet 40 mg  40 mg Oral Q1200 Ivin Poot, MD   40 mg at 09/01/12 1211  . simvastatin (ZOCOR) tablet 20 mg  20 mg Oral q1800 Nani Skillern, PA   20 mg at 09/01/12 1218  . sodium chloride 0.9 % injection 10-40 mL  10-40 mL Intracatheter Q12H Ivin Poot, MD   10 mL at 08/27/12 1027  . sodium chloride 0.9 % injection 10-40 mL  10-40 mL Intracatheter PRN Ivin Poot, MD   10 mL at 08/22/12 1040  . traMADol (ULTRAM) tablet 50 mg  50 mg Oral Q6H PRN Ivin Poot, MD   50 mg at 09/02/12 0731  . DISCONTD: 0.9 % irrigation (POUR BTL)    PRN Ivin Poot, MD   1,000 mL at 09/01/12 0835  . DISCONTD: 0.9 % irrigation (POUR BTL)    PRN Ivin Poot, MD   1,000 mL at 09/01/12 0836  . DISCONTD: cefUROXime  (ZINACEF) 1.5 g IVPB  1.5 g  Continuous PRN Randel K Temples, CRNA   1.5 g at 09/01/12 0837  . DISCONTD: fentaNYL (SUBLIMAZE) injection 50-100 mcg  50-100 mcg Intravenous PRN Napoleon Form, MD      . DISCONTD: fentaNYL (SUBLIMAZE) injection 50-100 mcg  50-100 mcg Intravenous PRN Leda Quail, MD      . DISCONTD: fentaNYL (SUBLIMAZE) injection    PRN Josepha Pigg, CRNA   50 mcg at 09/01/12 0917  . DISCONTD: glycopyrrolate (ROBINUL) injection    PRN Randel K Temples, CRNA   0.3 mg at 09/01/12 0941  . DISCONTD: HYDROmorphone (DILAUDID) injection 0.25-0.5 mg  0.25-0.5 mg Intravenous Q5 min PRN Warrick Parisian, MD   0.5 mg at 08/31/12 1235  . DISCONTD: HYDROmorphone (DILAUDID) injection 0.25-0.5 mg  0.25-0.5 mg Intravenous Q5 min PRN Leda Quail, MD      . DISCONTD: lactated ringers infusion   Intravenous Continuous Delma Freeze, MD 50 mL/hr at 08/31/12 2347    . DISCONTD: lactated ringers infusion    Continuous PRN Randel K Temples, CRNA      . DISCONTD: lidocaine (cardiac) 100 mg/31ml (XYLOCAINE) 20 MG/ML injection 2%    PRN Randel K Temples, CRNA   100 mg at 09/01/12 0852  . DISCONTD: midazolam (VERSED) injection 1-2 mg  1-2 mg Intravenous PRN Napoleon Form, MD      . DISCONTD: midazolam (VERSED) injection 1-2 mg  1-2 mg Intravenous PRN Leda Quail, MD      . DISCONTD: neostigmine (PROSTIGMINE) injection   Intravenous PRN Randel Carollee Massed, CRNA   2 mg at 09/01/12 0941  . DISCONTD: promethazine (PHENERGAN) injection 6.25-12.5 mg  6.25-12.5 mg Intravenous Q15 min PRN Leda Quail, MD      . DISCONTD: propofol (DIPRIVAN) 10 mg/mL bolus/IV push    PRN Randel K Temples, CRNA   150 mg at 09/01/12 0852  . DISCONTD: rocuronium (ZEMURON) injection    PRN Randel K Temples, CRNA   50 mg at 09/01/12 [id number]     Past Medical History  Diagnosis Date  . Hypertension   . Diabetes mellitus     diagnosed in 2008  . Glaucoma   . Hyperlipidemia   . CVA (cerebral infarction)     right internal capsule stroke  in 12/2006  . Left-sided sensory deficit present   . Cocaine abuse     crack cocaine heavily until 2008 then sporatic use since then  .  Tobacco abuse   . Thyroid nodule     FNA in [id number] showed follicular cells but not definate neoplasm  . Coronary artery disease   . Myocardial infarction   . Anginal pain   . Stroke     Past Surgical History  Procedure Date  . Coronary artery bypass graft 07/09/2012    Procedure: CORONARY ARTERY BYPASS GRAFTING (CABG);  Surgeon: Ivin Poot, MD;  Location: [facility];  Service: Open Heart Surgery;  Laterality: N/A;  . Cardiac catheterization   . Sternal wound debridement 08/17/2012    Procedure: STERNAL WOUND DEBRIDEMENT;  Surgeon: Ivin Poot, MD;  Location: [facility];  Service: Thoracic;  Laterality: N/A;  wound vac application  . Sternal wound debridement 08/24/2012    Procedure: STERNAL WOUND DEBRIDEMENT;  Surgeon: Ivin Poot, MD;  Location: [facility];  Service: Thoracic;  Laterality: N/A;    History   Social History  . Marital Status: Single    Spouse Name: N/A    Number of Children: N/A  . Years of Education: N/A   Occupational History  . Not on file.   Social History Main Topics  . Smoking status: Current Everyday Smoker -- 1.0 packs/day  . Smokeless tobacco: Not on file  . Alcohol Use: No  . Drug Use: Yes    Special: Cocaine  . Sexually Active: No   Other Topics Concern  . Not on file   Social History Narrative  . No narrative on file    ROS: no fevers or chills, productive cough, hemoptysis, dysphasia, odynophagia, melena, hematochezia, dysuria, hematuria, rash, seizure activity, orthopnea, PND, pedal edema, claudication. Remaining systems are negative.  Physical Exam: Well-developed well-nourished in no acute distress.  Skin is warm and dry.  HEENT is normal.  Neck is supple. No thyromegaly.  Chest is clear to auscultation with normal expansion.  Cardiovascular exam is regular rate and rhythm.  Abdominal exam nontender  or distended. No masses palpated. Extremities show no edema. neuro grossly intact  ECG     This encounter was created in error - please disregard.

## 2012-09-02 NOTE — Progress Notes (Signed)
Hypoglycemic Event  CBG: 62  Treatment: 4 ounces of grape juice  Symptoms: none  Follow-up CBG: Time:1646 CBG Result:85  Possible Reasons for Event: poor po intake  Comments/MD notified:Dr. Not notified at this time. Will cont. To monitor patient.    [name] [name] Diane  Remember to initiate Hypoglycemia Order Set & complete

## 2012-09-03 LAB — GLUCOSE, CAPILLARY
Glucose-Capillary: 119 mg/dL — ABNORMAL HIGH (ref 70–99)
Glucose-Capillary: 72 mg/dL (ref 70–99)
Glucose-Capillary: 107 mg/dL — ABNORMAL HIGH (ref 70–99)
Glucose-Capillary: 154 mg/dL — ABNORMAL HIGH (ref 70–99)

## 2012-09-03 NOTE — Progress Notes (Signed)
CARDIAC REHAB PHASE I   PRE:  Rate/Rhythm: 76 SR  BP:  Supine:   Sitting: 90/52  Standing:    SaO2: 98 RA  MODE:  Ambulation: 500 ft   POST:  Rate/Rhythem: 72  BP:  Supine:   Sitting: 98/60  Standing:    SaO2: 96 RA 1355-1430 Assisted X 1 and used walker to ambulate. Gait steady with walker VS stable. Pt states that she feels good today. She is motivated to walk today. Pt back to recliner after walk with call light in reach.  Deon Pilling

## 2012-09-03 NOTE — Progress Notes (Signed)
2 Days Post-Op Procedure(s) (LRB): STERNAL WOUND DEBRIDEMENT (N/A) Subjective:  [name] [name] has no complaints this morning.    Objective: Vital signs in last 24 hours: Temp:  [97.9 F (36.6 C)-98.1 F (36.7 C)] 98 F (36.7 C) (09/06 0455) Pulse Rate:  [72-99] 99  (09/06 0455) Cardiac Rhythm:  [-] Normal sinus rhythm (09/05 1930) Resp:  [18] 18  (09/06 0455) BP: (115-127)/(74-83) 127/74 mmHg (09/06 0455) SpO2:  [94 %-100 %] 94 % (09/06 0455) Weight:  [240 lb 11.9 oz (109.2 kg)] 240 lb 11.9 oz (109.2 kg) (09/06 0455) Intake/Output from previous day: 09/05 0701 - 09/06 0700 In: 1200 [P.O.:960; I.V.:240] Out: 2753 [Urine:2750; Stool:3]  General appearance: alert, cooperative and no distress Heart: regular rate and rhythm Lungs: clear to auscultation bilaterally Abdomen: soft, non-tender; bowel sounds normal; no masses,  no organomegaly Extremities: edema trace Wound: clean and dry  Lab Results:  [facility] 09/02/12 0516 08/31/12 2134  WBC 6.5 5.6  HGB 10.6* 11.4*  HCT 33.9* 35.3*  PLT 429* 141*   BMET:  Basename 09/02/12 0516 08/31/12 2134  NA 139 141  K 3.6 4.4  CL 100 106  CO2 26 25  GLUCOSE 59* 205*  BUN 16 28*  CREATININE 1.15* 1.13*  CALCIUM 9.4 9.3    PT/INR: No results found for this basename: LABPROT,INR in the last 72 hours ABG    Component Value Date/Time   PHART 7.460* 08/16/2012 1330   HCO3 26.8* 08/16/2012 1330   TCO2 27.9 08/16/2012 1330   ACIDBASEDEF 1.0 07/09/2012 2154   O2SAT 94.7 08/16/2012 1330   CBG (last 3)   Basename 09/03/12 0602 09/02/12 2104 09/02/12 1646  GLUCAP 119* 79 85    Assessment/Plan: S/P Procedure(s) (LRB): STERNAL WOUND DEBRIDEMENT (N/A)  1. Sternal wound infection- Dr. Prescott Gum to change wound vac today 2. +Klebsiella and Morganella- on IV Cipro DAy 14 3. Dispo- continue wound vac changes MWF   LOS: 18 days    [name] [name] 09/03/2012

## 2012-09-03 NOTE — Progress Notes (Signed)
CSW spoke to [facility].  Pt had 5th procedure completed on wound.  Pt will be d/c to [facility], SNF once medically stable and wound is acceptable. Nonnie Done, [place] [phone]  Clinical Social Work

## 2012-09-04 LAB — GLUCOSE, CAPILLARY
Glucose-Capillary: 145 mg/dL — ABNORMAL HIGH (ref 70–99)
Glucose-Capillary: 110 mg/dL — ABNORMAL HIGH (ref 70–99)
Glucose-Capillary: 92 mg/dL (ref 70–99)
Glucose-Capillary: 75 mg/dL (ref 70–99)

## 2012-09-04 NOTE — Progress Notes (Addendum)
                    [address]            [place] [address]          [phone]     3 Days Post-Op Procedure(s) (LRB): STERNAL WOUND DEBRIDEMENT (N/A)  Subjective: Feels well, no complaints.  Objective: Vital signs in last 24 hours: Patient Vitals for the past 24 hrs:  BP Temp Temp src Pulse Resp SpO2 Weight  09/04/12 0623 118/79 mmHg 97.3 F (36.3 C) Oral 94  18  99 % 255 lb 15.3 oz (116.1 kg)  09/03/12 2111 118/57 mmHg 98.3 F (36.8 C) Oral 90  18  99 % -  09/03/12 1345 122/65 mmHg 97.3 F (36.3 C) Oral 75  18  99 % -   Current Weight  09/04/12 255 lb 15.3 oz (116.1 kg)     Intake/Output from previous day: 09/06 0701 - 09/07 0700 In: 1920 [P.O.:1680; I.V.:240] Out: 1740 [Urine:1700; Drains:40]  CBGs 107- 154-145  PHYSICAL EXAM:  Heart: RRR Lungs: Clear Wound: Sternal VAC in place, no surrounding erythema     Lab Results: CBC: Basename 09/02/12 0516  WBC 6.5  HGB 10.6*  HCT 33.9*  PLT 429*   BMET:  Basename 09/02/12 0516  NA 139  K 3.6  CL 100  CO2 26  GLUCOSE 59*  BUN 16  CREATININE 1.15*  CALCIUM 9.4    PT/INR: No results found for this basename: LABPROT,INR in the last 72 hours    Assessment/Plan: S/P Procedure(s) (LRB): STERNAL WOUND DEBRIDEMENT (N/A) Continue routine VAC changes, IV Cipro D#15.   LOS: 19 days    COLLINS,GINA H 09/04/2012   patient examined and medical record reviewed,agree with above note. Plan to change wound VAC Monday and administer ACELL  powder to the wound. [name],Pooja Camuso 09/04/2012

## 2012-09-04 NOTE — Progress Notes (Signed)
CARDIAC REHAB PHASE I   PRE:  Rate/Rhythm: Sinus Rhythm 95  BP:    Sitting: 124/70    SaO2: 97% Room air  MODE:  Ambulation: 500 ft   POST:  Rate/Rhythm: Sinus Rhythm 95  BP:    Sitting: 124/70    SaO2: 99% Room Air  1330-1400 Patient ambulated in hallway without complaints. VAC intact.  Patient assisted back to bed with call light within reach.  Venetia Maxon, [place]a See

## 2012-09-05 LAB — GLUCOSE, CAPILLARY
Glucose-Capillary: 75 mg/dL (ref 70–99)
Glucose-Capillary: 87 mg/dL (ref 70–99)
Glucose-Capillary: 200 mg/dL — ABNORMAL HIGH (ref 70–99)

## 2012-09-05 NOTE — Progress Notes (Signed)
                    [address]            [place] [address]          [phone]     4 Days Post-Op Procedure(s) (LRB): STERNAL WOUND DEBRIDEMENT (N/A)  Subjective: Finally had a BM.  No other new complaints.  Objective: Vital signs in last 24 hours: Patient Vitals for the past 24 hrs:  BP Temp Temp src Pulse Resp SpO2 Weight  09/05/12 1348 126/74 mmHg 98.2 F (36.8 C) Oral 68  18  - -  09/05/12 0421 140/80 mmHg 98 F (36.7 C) Oral 86  18  100 % 258 lb 11.2 oz (117.346 kg)  09/04/12 2023 118/71 mmHg 98.3 F (36.8 C) Oral 89  17  98 % -   Current Weight  09/05/12 258 lb 11.2 oz (117.346 kg)     Intake/Output from previous day: 09/07 0701 - 09/08 0700 In: 480 [P.O.:480] Out: 1101 [Urine:1100; Stool:1]    PHYSICAL EXAM:  Heart: RRR Lungs: clear Wound:Sternal VAC in place, no surrounding erythema     Lab Results: CBC:No results found for this basename: WBC:2,HGB:2,HCT:2,PLT:2 in the last 72 hours BMET: No results found for this basename: NA:2,K:2,CL:2,CO2:2,GLUCOSE:2,BUN:2,CREATININE:2,CALCIUM:2 in the last 72 hours  PT/INR: No results found for this basename: LABPROT,INR in the last 72 hours    Assessment/Plan: S/P Procedure(s) (LRB): STERNAL WOUND DEBRIDEMENT (N/A) For VAC change in am with Acell. Cipro D#16   LOS: 20 days    Brannen Koppen H 09/05/2012

## 2012-09-06 LAB — GLUCOSE, CAPILLARY
Glucose-Capillary: 124 mg/dL — ABNORMAL HIGH (ref 70–99)
Glucose-Capillary: 156 mg/dL — ABNORMAL HIGH (ref 70–99)
Glucose-Capillary: 45 mg/dL — ABNORMAL LOW (ref 70–99)
Glucose-Capillary: 141 mg/dL — ABNORMAL HIGH (ref 70–99)
Glucose-Capillary: 152 mg/dL — ABNORMAL HIGH (ref 70–99)

## 2012-09-06 NOTE — Progress Notes (Signed)
T. CTS progress note  Sternal wound examined, wound VAC changed 80% wound granulating, that the wound has some exposed sternal bone. More a cell wound matrix powder was applied.  Plan continue IV antibiotics for gram-negative sternal wound infection. Continue good nutrition. Continue to diabetic control. Continue wound VAC therapy. Consider plastic surgery consultation if no improvement during wound exams this week.

## 2012-09-06 NOTE — Progress Notes (Signed)
1025 Pt declined CRP 2 until wound dressing changed. Will followup as time permits. Diontre Harps DunlapRN

## 2012-09-06 NOTE — Progress Notes (Signed)
Pt refused ambulation. Sts she is not getting OOB. Could not be coerced. Will f/u tomorrow. Yves Dill CES, ACSM

## 2012-09-07 LAB — GLUCOSE, CAPILLARY
Glucose-Capillary: 75 mg/dL (ref 70–99)
Glucose-Capillary: 112 mg/dL — ABNORMAL HIGH (ref 70–99)
Glucose-Capillary: 77 mg/dL (ref 70–99)
Glucose-Capillary: 101 mg/dL — ABNORMAL HIGH (ref 70–99)

## 2012-09-07 MED ORDER — GLUCERNA SHAKE PO LIQD: 237.0000 mL | Freq: Every day | ORAL | Status: DC | PRN

## 2012-09-07 NOTE — Progress Notes (Signed)
1124 Came to walk with pt. Declined due to fact that plastic surgeon to see her around 2pm. Told her we had plenty of time to walk and that the surgeon would want her to walk. Still declined. Will follow up if time permits. Magalie Almon DunlapRN

## 2012-09-07 NOTE — Progress Notes (Signed)
                   [address]            [place] [address]          [phone]     6 Days Post-Op  Procedure(s) (LRB): STERNAL WOUND DEBRIDEMENT (N/A) Subjective: Feels ok, no new C/O  Objective  Telemetry sinus rhythm  Temp:  [97.9 F (36.6 C)-98.9 F (37.2 C)] 98.2 F (36.8 C) (09/10 0830) Pulse Rate:  [70-80] 80  (09/10 0830) Resp:  [16-18] 18  (09/10 0830) BP: (109-120)/(65-78) 117/75 mmHg (09/10 0830) SpO2:  [98 %-100 %] 100 % (09/10 0830) Weight:  [257 lb 6.4 oz (116.756 kg)] 257 lb 6.4 oz (116.756 kg) (09/10 0324)   Intake/Output Summary (Last 24 hours) at 09/07/12 0916 Last data filed at 09/06/12 1842  Gross per 24 hour  Intake    700 ml  Output   2000 ml  Net  -1300 ml       General appearance: alert, cooperative and no distress Heart: regular rate and rhythm Lungs: clear to auscultation bilaterally Wound: vac in place  Lab Results: No results found for this basename: NA:2,K:2,CL:2,CO2:2,GLUCOSE:2,BUN:2,CREATININE:2,CALCIUM:2,MG:2,PHOS:2 in the last 72 hours No results found for this basename: AST:2,ALT:2,ALKPHOS:2,BILITOT:2,PROT:2,ALBUMIN:2 in the last 72 hours No results found for this basename: LIPASE:2,AMYLASE:2 in the last 72 hours No results found for this basename: WBC:2,NEUTROABS:2,HGB:2,HCT:2,MCV:2,PLT:2 in the last 72 hours No results found for this basename: CKTOTAL:4,CKMB:4,TROPONINI:4 in the last 72 hours No components found with this basename: POCBNP:3 No results found for this basename: DDIMER in the last 72 hours No results found for this basename: HGBA1C in the last 72 hours No results found for this basename: CHOL,HDL,LDLCALC,TRIG,CHOLHDL in the last 72 hours No results found for this basename: TSH,T4TOTAL,FREET3,T3FREE,THYROIDAB in the last 72 hours No results found for this basename: VITAMINB12,FOLATE,FERRITIN,TIBC,IRON,RETICCTPCT in the last 72 hours  Medications: Scheduled    . aspirin  325 mg Oral Daily  .  ciprofloxacin  400 mg Intravenous Q12H  . enoxaparin  40 mg Subcutaneous Q24H  . feeding supplement  237 mL Oral BID  . furosemide  40 mg Oral Daily  . glimepiride  4 mg Oral Q breakfast  . insulin aspart  0-20 Units Subcutaneous TID WC  . insulin aspart  0-5 Units Subcutaneous QHS  . insulin aspart  4 Units Subcutaneous TID WC  . metFORMIN  1,000 mg Oral BID  . metoprolol tartrate  25 mg Oral BID  . pantoprazole  40 mg Oral Q1200  . simvastatin  20 mg Oral q1800  . sodium chloride  10-40 mL Intracatheter Q12H     Radiology/Studies:  No results found.  INR: Will add last result for INR, ABG once components are confirmed Will add last 4 CBG results once components are confirmed  Assessment/Plan: S/P Procedure(s) (LRB): STERNAL WOUND DEBRIDEMENT (N/A)  1. Stable on current rx/tx, may need plastics eval for flap at some point    LOS: 22 days    Dyshaun Bonzo E 9/10/20139:16 AM

## 2012-09-07 NOTE — Progress Notes (Addendum)
Nutrition Follow-up  Intervention:    Glucerna Shake daily PRN (220 kcals, 9.9 gm protein per 8 fl oz bottle) RD to follow for nutrition care plan  Assessment:   S/p sternal wound debridement, replacement of wound VAC sponge 9/5. PO intake improving; per flowsheet records 100% for all 3 meals yesterday. Will change supplement frequency to daily as needed.  Diet Order:  Carbohydrate Modified Medium Calorie  Meds: Scheduled Meds:   . aspirin  325 mg Oral Daily  . ciprofloxacin  400 mg Intravenous Q12H  . enoxaparin  40 mg Subcutaneous Q24H  . feeding supplement  237 mL Oral BID  . furosemide  40 mg Oral Daily  . glimepiride  4 mg Oral Q breakfast  . insulin aspart  0-20 Units Subcutaneous TID WC  . insulin aspart  0-5 Units Subcutaneous QHS  . insulin aspart  4 Units Subcutaneous TID WC  . metFORMIN  1,000 mg Oral BID  . metoprolol tartrate  25 mg Oral BID  . pantoprazole  40 mg Oral Q1200  . simvastatin  20 mg Oral q1800  . sodium chloride  10-40 mL Intracatheter Q12H   Continuous Infusions:  PRN Meds:.sodium chloride, acetaminophen, lactulose, magnesium hydroxide, oxyCODONE, sodium chloride, traMADol  Labs:  CMP     Component Value Date/Time   NA 139 09/02/2012 0516   K 3.6 09/02/2012 0516   CL 100 09/02/2012 0516   CO2 26 09/02/2012 0516   GLUCOSE 59* 09/02/2012 0516   BUN 16 09/02/2012 0516   CREATININE 1.15* 09/02/2012 0516   CALCIUM 9.4 09/02/2012 0516   PROT 6.6 08/31/2012 2134   ALBUMIN 3.6 08/31/2012 2134   AST 41* 08/31/2012 2134   ALT 29 08/31/2012 2134   ALKPHOS 56 08/31/2012 2134   BILITOT 0.3 08/31/2012 2134   GFRNONAA 50* 09/02/2012 0516   GFRAA 58* 09/02/2012 0516     Intake/Output Summary (Last 24 hours) at 09/07/12 1059 Last data filed at 09/06/12 1842  Gross per 24 hour  Intake    700 ml  Output   2000 ml  Net  -1300 ml    CBG (last 3)   Basename 09/07/12 0641 09/06/12 2207 09/06/12 2110  GLUCAP 75 152* 141*    Weight Status:  116.7 kg (9/10) -- fluctuating    Re-estimated needs:  1800-2000 kcals, 95-105 gm protein  Nutrition Dx:  Increased Nutrient Needs, ongoing  Goal:  Oral intake with meals & supplements to meet >/= 90% of estimated nutrition needs, met  Monitor:  PO & supplemental intake, weight, labs, I/O's  Phillips Odor, RD, LDN Pager #: [phone] After-Hours Pager #: [phone]

## 2012-09-07 NOTE — Progress Notes (Signed)
Pt adamantly refused again. Sts she is not leaving room until plastic surgeon comes. Sts she does not feel like it. Aunt also tried to convince her but she refuses.  Yves Dill CES, ACSM

## 2012-09-08 LAB — GLUCOSE, CAPILLARY
Glucose-Capillary: 100 mg/dL — ABNORMAL HIGH (ref 70–99)
Glucose-Capillary: 149 mg/dL — ABNORMAL HIGH (ref 70–99)
Glucose-Capillary: 63 mg/dL — ABNORMAL LOW (ref 70–99)
Glucose-Capillary: 87 mg/dL (ref 70–99)
Glucose-Capillary: 121 mg/dL — ABNORMAL HIGH (ref 70–99)

## 2012-09-08 MED ORDER — INFLUENZA VIRUS VACC SPLIT PF IM SUSP
0.5000 mL | INTRAMUSCULAR | Status: AC
Start: 2012-09-09 — End: 2012-09-09
  Administered 2012-09-09: 0.5 mL via INTRAMUSCULAR
  Filled 2012-09-08: qty 0.5

## 2012-09-08 NOTE — Progress Notes (Signed)
1100 Offered to walk with pt but she declined. C/o pain from wound change. Pt's RN to give pain med. Will follow up as time permits. [name] Reagor DUnlapRN

## 2012-09-08 NOTE — Progress Notes (Signed)
CSW continuing to follow pt.  SNF continues to have bed available and Letter of Guarantee is still available for pt upon d/c. Nonnie Done, [place] [phone]  Clinical Social Work

## 2012-09-08 NOTE — Progress Notes (Signed)
                   [address]            [place] [address]          [phone]     7 Days Post-Op  Procedure(s) (LRB): STERNAL WOUND DEBRIDEMENT (N/A) Subjective: No new complaints  Objective  Telemetry sinus, PVC's  Temp:  [97.3 F (36.3 C)-98.7 F (37.1 C)] 98.7 F (37.1 C) (09/11 0419) Pulse Rate:  [70-80] 70  (09/11 0419) Resp:  [16-18] 18  (09/11 0419) BP: (114-120)/(63-75) 120/63 mmHg (09/11 0419) SpO2:  [98 %-100 %] 98 % (09/11 0419) Weight:  [251 lb 1.6 oz (113.898 kg)] 251 lb 1.6 oz (113.898 kg) (09/11 0258)   Intake/Output Summary (Last 24 hours) at 09/08/12 0826 Last data filed at 09/07/12 1900  Gross per 24 hour  Intake    720 ml  Output   1250 ml  Net   -530 ml       General appearance: alert, cooperative and no distress Heart: regular rate and rhythm Lungs: clear to auscultation bilaterally Wound: vac in place, no external findings  Lab Results: No results found for this basename: NA:2,K:2,CL:2,CO2:2,GLUCOSE:2,BUN:2,CREATININE:2,CALCIUM:2,MG:2,PHOS:2 in the last 72 hours No results found for this basename: AST:2,ALT:2,ALKPHOS:2,BILITOT:2,PROT:2,ALBUMIN:2 in the last 72 hours No results found for this basename: LIPASE:2,AMYLASE:2 in the last 72 hours No results found for this basename: WBC:2,NEUTROABS:2,HGB:2,HCT:2,MCV:2,PLT:2 in the last 72 hours No results found for this basename: CKTOTAL:4,CKMB:4,TROPONINI:4 in the last 72 hours No components found with this basename: POCBNP:3 No results found for this basename: DDIMER in the last 72 hours No results found for this basename: HGBA1C in the last 72 hours No results found for this basename: CHOL,HDL,LDLCALC,TRIG,CHOLHDL in the last 72 hours No results found for this basename: TSH,T4TOTAL,FREET3,T3FREE,THYROIDAB in the last 72 hours No results found for this basename: VITAMINB12,FOLATE,FERRITIN,TIBC,IRON,RETICCTPCT in the last 72 hours  Medications: Scheduled    . aspirin  325  mg Oral Daily  . ciprofloxacin  400 mg Intravenous Q12H  . enoxaparin  40 mg Subcutaneous Q24H  . furosemide  40 mg Oral Daily  . glimepiride  4 mg Oral Q breakfast  . insulin aspart  0-20 Units Subcutaneous TID WC  . insulin aspart  0-5 Units Subcutaneous QHS  . insulin aspart  4 Units Subcutaneous TID WC  . metFORMIN  1,000 mg Oral BID  . metoprolol tartrate  25 mg Oral BID  . pantoprazole  40 mg Oral Q1200  . simvastatin  20 mg Oral q1800  . sodium chloride  10-40 mL Intracatheter Q12H  . DISCONTD: feeding supplement  237 mL Oral BID     Radiology/Studies:  No results found.  INR: Will add last result for INR, ABG once components are confirmed Will add last 4 CBG results once components are confirmed  Assessment/Plan: S/P Procedure(s) (LRB): STERNAL WOUND DEBRIDEMENT (N/A)  1. Stable on current rx/tx, VAC changes as scheduled    LOS: 23 days    GOLD,WAYNE E 9/11/20138:26 AM

## 2012-09-08 NOTE — Consult Note (Signed)
WOC follow up note Wound type: sternal surgical s/p OR debridement and placement of Acell dressing/powder Wound bed: early granulation, pink, moist minimal slough noted, debrided at bedside today per Dr. Prescott Gum. Some tunneling at 11 o'clock, with probed bone   Drainage (amount, consistency, odor) serosanguineous in canister Periwound: intact without problems Dressing procedure/placement/frequency: Acell powder placed at deepest point per Dr. Prescott Gum with a layer of Mepitel and hydrogel per MD. [facility] dressing with one pc. Of black granufoam, drape and seal obtained.  Pt tolerated without problems.  WOC will complete VAC dressing on Friday-new layer of Mepitel, hydrogel and foam dressing.   Supplies at bedside for Friday dressing, request small VAC dressing to be ordered.   WOC will follow along with you for assistance with [facility] dressings  [place], [place]

## 2012-09-08 NOTE — Progress Notes (Signed)
CARDIAC REHAB PHASE I   PRE:  Rate/Rhythm: 68 SR    BP: sitting 116/70    SaO2: 98 RA  MODE:  Ambulation: 450 ft   POST:  Rate/Rhythm: 80    BP: sitting 106/70     SaO2: 98 RA  Willing to walk after family member urged her. Only held to IV pole. Tolerated well. No c/o. Stated she enjoyed walk because it got her mind off things (frustrated with personal issues). Encouraged more walking later. Will f/u tomorrow. [place], [place], ACSM

## 2012-09-09 ENCOUNTER — Inpatient Hospital Stay (HOSPITAL_COMMUNITY): Payer: Medicaid Other

## 2012-09-09 ENCOUNTER — Encounter (HOSPITAL_COMMUNITY): Payer: Self-pay | Admitting: Radiology

## 2012-09-09 LAB — GLUCOSE, CAPILLARY
Glucose-Capillary: 93 mg/dL (ref 70–99)
Glucose-Capillary: 113 mg/dL — ABNORMAL HIGH (ref 70–99)
Glucose-Capillary: 77 mg/dL (ref 70–99)
Glucose-Capillary: 128 mg/dL — ABNORMAL HIGH (ref 70–99)

## 2012-09-09 LAB — PREALBUMIN: Prealbumin: 17.1 mg/dL — ABNORMAL LOW (ref 17.0–34.0)

## 2012-09-09 MED ORDER — BOOST / RESOURCE BREEZE PO LIQD
1.0000 | Freq: Two times a day (BID) | ORAL | Status: DC
  Administered 2012-09-09 – 2012-09-18 (×5): 1 via ORAL

## 2012-09-09 MED ORDER — IOHEXOL 300 MG/ML  SOLN
100.0000 mL | Freq: Once | INTRAMUSCULAR | Status: AC | PRN
  Administered 2012-09-09: 100 mL via INTRAVENOUS

## 2012-09-09 NOTE — Progress Notes (Addendum)
                    [address]            [place] [address]          [phone]     8 Days Post-Op Procedure(s) (LRB): STERNAL WOUND DEBRIDEMENT (N/A)  Subjective: A little sore around VAC, but otherwise stable.   Objective: Vital signs in last 24 hours: Patient Vitals for the past 24 hrs:  BP Temp Temp src Pulse Resp SpO2  09/09/12 0400 117/65 mmHg 98.8 F (37.1 C) Oral 85  18  97 %  09/08/12 1949 120/74 mmHg 98.7 F (37.1 C) Oral 72  18  98 %  09/08/12 1328 123/65 mmHg 98.2 F (36.8 C) Oral 66  18  100 %  09/08/12 1039 110/69 mmHg - - 70  - -   Current Weight  09/08/12 251 lb 1.6 oz (113.898 kg)     Intake/Output from previous day: 09/11 0701 - 09/12 0700 In: 1180 [P.O.:1170; I.V.:10] Out: 2500 [Urine:2500]  CBGs 63-87-121-93  PHYSICAL EXAM:  Heart: RRR Lungs: Clear Wound: Sternal wound VAC in place, no surrounding erythema     Lab Results: CBC:No results found for this basename: WBC:2,HGB:2,HCT:2,PLT:2 in the last 72 hours BMET: No results found for this basename: NA:2,K:2,CL:2,CO2:2,GLUCOSE:2,BUN:2,CREATININE:2,CALCIUM:2 in the last 72 hours  PT/INR: No results found for this basename: LABPROT,INR in the last 72 hours    Assessment/Plan: S/P Procedure(s) (LRB): STERNAL WOUND DEBRIDEMENT (N/A) Continue wound VAC- for change in am. DM- had some low blood sugars yesterday, but eating well.  Will monitor today and adjust meds as needed.   LOS: 24 days    COLLINS,GINA H 09/09/2012   CT scan of the chest shows no retrosternal abscess. Sternal bone is exposed and will probably need further debridement. We'll have plastic surgery evaluate wound next week. Continue wound VAC and IV antibiotic.

## 2012-09-09 NOTE — Progress Notes (Signed)
CARDIAC REHAB PHASE I   PRE:  Rate/Rhythm: 95  BP:  Supine:   Sitting: Took pt from chair just finished bath Standing:    SaO2: 98 RA  MODE:  Ambulation: 420 ft   POST:  Rate/Rhythem: 105 ST  BP:  Supine:   Sitting: 100/60  Standing:    SaO2: 98 RA 1510-1540  On arrival pt finishing bath and willing to walk. Assisted X 1 and pt held to IV pole to ambulate. Gait steady VS stable after walk. Pt to recliner after walk with call light in reach.  Deon Pilling

## 2012-09-09 NOTE — Progress Notes (Signed)
Nutrition Consult/Follow-up  Intervention:    [employer] 2 times daily with meals (250 kcals, 9 gm protein per 8 fl oz carton) RD to follow for nutrition care plan  Assessment:   RD consult for wound healing. Initial nutrition assessment completed 9/3. Patient states her appetite is good. PO intake 100% per flowsheet records. Does not like Glucerna Shakes. Likes [employer] supplements -- RD to order.  Diet Order:  Carbohydrate Modified Medium Calorie  Meds: Scheduled Meds:   . aspirin  325 mg Oral Daily  . ciprofloxacin  400 mg Intravenous Q12H  . enoxaparin  40 mg Subcutaneous Q24H  . furosemide  40 mg Oral Daily  . glimepiride  4 mg Oral Q breakfast  . influenza  inactive virus vaccine  0.5 mL Intramuscular Tomorrow-1000  . insulin aspart  0-20 Units Subcutaneous TID WC  . insulin aspart  0-5 Units Subcutaneous QHS  . insulin aspart  4 Units Subcutaneous TID WC  . metFORMIN  1,000 mg Oral BID  . metoprolol tartrate  25 mg Oral BID  . pantoprazole  40 mg Oral Q1200  . simvastatin  20 mg Oral q1800  . sodium chloride  10-40 mL Intracatheter Q12H   Continuous Infusions:  PRN Meds:.sodium chloride, acetaminophen, feeding supplement, lactulose, magnesium hydroxide, oxyCODONE, sodium chloride, traMADol  Labs:  CMP     Component Value Date/Time   NA 139 09/02/2012 0516   K 3.6 09/02/2012 0516   CL 100 09/02/2012 0516   CO2 26 09/02/2012 0516   GLUCOSE 59* 09/02/2012 0516   BUN 16 09/02/2012 0516   CREATININE 1.15* 09/02/2012 0516   CALCIUM 9.4 09/02/2012 0516   PROT 6.6 08/31/2012 2134   ALBUMIN 3.6 08/31/2012 2134   AST 41* 08/31/2012 2134   ALT 29 08/31/2012 2134   ALKPHOS 56 08/31/2012 2134   BILITOT 0.3 08/31/2012 2134   GFRNONAA 50* 09/02/2012 0516   GFRAA 58* 09/02/2012 0516     Intake/Output Summary (Last 24 hours) at 09/09/12 1100 Last data filed at 09/08/12 2140  Gross per 24 hour  Intake    820 ml  Output   1650 ml  Net   -830 ml    CBG (last 3)   Basename  09/09/12 0559 09/08/12 2106 09/08/12 1715  GLUCAP 93 121* 87    Weight Status:  113.8 kg (9/11) -- fluctuating   Re-estimated needs: 1800-2000 kcals, 110-120 gm protein   Nutrition Dx: Increased Nutrient Needs, ongoing   Goal: Oral intake with meals & supplements to meet >/= 90% of estimated nutrition needs, met   Monitor: PO & supplemental intake, weight, labs, I/O's   Phillips Odor, RD, LDN  Pager #: [phone]  After-Hours Pager #: [phone]

## 2012-09-10 LAB — GLUCOSE, CAPILLARY
Glucose-Capillary: 99 mg/dL (ref 70–99)
Glucose-Capillary: 104 mg/dL — ABNORMAL HIGH (ref 70–99)
Glucose-Capillary: 95 mg/dL (ref 70–99)
Glucose-Capillary: 149 mg/dL — ABNORMAL HIGH (ref 70–99)

## 2012-09-10 MED ORDER — INSULIN ASPART 100 UNIT/ML ~~LOC~~ SOLN
2.0000 [IU] | Freq: Three times a day (TID) | SUBCUTANEOUS | Status: DC
  Administered 2012-09-10 – 2012-09-24 (×13): 2 [IU] via SUBCUTANEOUS

## 2012-09-10 NOTE — Progress Notes (Signed)
[id number] Cardiac Rehab Have attempted two times this pm to get pt to ambulate. She has declines both times, getting her hair fixed. Encouraged her to walk after getting her hair done.

## 2012-09-10 NOTE — Progress Notes (Addendum)
                    [address]            [place] [address]          [phone]     9 Days Post-Op Procedure(s) (LRB): STERNAL WOUND DEBRIDEMENT (N/A)  Subjective: Has a little bit of soreness around wound VAC.   Objective: Vital signs in last 24 hours: Patient Vitals for the past 24 hrs:  BP Temp Temp src Pulse Resp SpO2  09/10/12 0437 108/66 mmHg 99.2 F (37.3 C) Oral 84  18  95 %  09/09/12 2118 140/72 mmHg - - 89  - -  09/09/12 1923 99/66 mmHg 98.9 F (37.2 C) Oral 82  18  95 %  09/09/12 1408 111/68 mmHg 98.4 F (36.9 C) Axillary 71  18  100 %   Current Weight  09/08/12 251 lb 1.6 oz (113.898 kg)     Intake/Output from previous day: 09/12 0701 - 09/13 0700 In: 850 [P.O.:840; I.V.:10] Out: -   CBGs 63-87-121-93  PHYSICAL EXAM:  Heart: RRR Lungs: Clear Wound: Sternal wound VAC in place, no surrounding erythema     Lab Results: CBC:No results found for this basename: WBC:2,HGB:2,HCT:2,PLT:2 in the last 72 hours BMET: No results found for this basename: NA:2,K:2,CL:2,CO2:2,GLUCOSE:2,BUN:2,CREATININE:2,CALCIUM:2 in the last 72 hours  PT/INR: No results found for this basename: LABPROT,INR in the last 72 hours    Assessment/Plan: S/P Procedure(s) (LRB): STERNAL WOUND DEBRIDEMENT (N/A) 1.Continue wound VAC- for change today.Continue Cipro.Continue CT scan showed no retrosternal abcess. Sternal bone is exposed and will likely require further debridement. May need plastic surgery evaluation. 2.DM- CBGs 77/128/99.Continue Amaryl and will decrease meal coverage to avoid hypoglycemia.   LOS: 25 days    ZIMMERMAN,DONIELLE M PA-C 09/10/2012   VAC changed today. For Plastic surgery evaluation on Monday  I have seen and examined ABRYANNA VANDEWIELE and agree with the above assessment  and plan.  [name] MD [place] [phone] Office [phone] 09/10/2012 12:54 PM

## 2012-09-10 NOTE — Consult Note (Signed)
WOC follow up  Wound type: sternal s/p debridement and placement of ACell graft and powder  Measurement: 8cm x 5cm x 2.5cm at proximal edge of wound (12 o'clock)  Wound bed: 90% granulation tissue, almost at skin level except for one area noted above Drainage (amount, consistency, odor) serosanguinous in cannister  Periwound: intact without problems Dressing procedure/placement/frequency: Mepitel changed over existing Acell powder placed on Wednesday with minimal disruption of remaining powder.  Hydrogel over Mepitel and 1pc of black granufoam cut to fit wound bed, drape placed. Seal obtained, pt tolerated without problems.    WOC will follow along with you for assistance with VAC dressing changes. [place], [place]

## 2012-09-11 LAB — GLUCOSE, CAPILLARY
Glucose-Capillary: 184 mg/dL — ABNORMAL HIGH (ref 70–99)
Glucose-Capillary: 95 mg/dL (ref 70–99)
Glucose-Capillary: 89 mg/dL (ref 70–99)
Glucose-Capillary: 133 mg/dL — ABNORMAL HIGH (ref 70–99)

## 2012-09-11 NOTE — Progress Notes (Signed)
09/11/2012 6:48 PM Nursing note Pt. Refused ambulation this evening. Pt. Encouraged to take at least one walk before bed.  Lankford Gutzmer, Arville Lime

## 2012-09-11 NOTE — Progress Notes (Addendum)
[address]            [place] [address]          [phone]     10 Days Post-Op  Procedure(s) (LRB): STERNAL WOUND DEBRIDEMENT (N/A) Subjective: Feels ok  Objective  Telemetry sinus  Temp:  [98.6 F (37 C)-99 F (37.2 C)] 98.6 F (37 C) (09/14 0449) Pulse Rate:  [74-82] 76  (09/14 0937) Resp:  [16-18] 16  (09/14 0449) BP: (89-114)/(57-77) 114/66 mmHg (09/14 0937) SpO2:  [96 %-98 %] 98 % (09/14 0449) Weight:  [261 lb 3.2 oz (118.48 kg)] 261 lb 3.2 oz (118.48 kg) (09/14 0449)   Intake/Output Summary (Last 24 hours) at 09/11/12 1037 Last data filed at 09/10/12 2129  Gross per 24 hour  Intake    490 ml  Output   1750 ml  Net  -1260 ml       General appearance: alert, cooperative and no distress Heart: regular rate and rhythm Lungs: clear to auscultation bilaterally Vac in place  Lab Results: No results found for this basename: NA:2,K:2,CL:2,CO2:2,GLUCOSE:2,BUN:2,CREATININE:2,CALCIUM:2,MG:2,PHOS:2 in the last 72 hours No results found for this basename: AST:2,ALT:2,ALKPHOS:2,BILITOT:2,PROT:2,ALBUMIN:2 in the last 72 hours No results found for this basename: LIPASE:2,AMYLASE:2 in the last 72 hours No results found for this basename: WBC:2,NEUTROABS:2,HGB:2,HCT:2,MCV:2,PLT:2 in the last 72 hours No results found for this basename: CKTOTAL:4,CKMB:4,TROPONINI:4 in the last 72 hours No components found with this basename: POCBNP:3 No results found for this basename: DDIMER in the last 72 hours No results found for this basename: HGBA1C in the last 72 hours No results found for this basename: CHOL,HDL,LDLCALC,TRIG,CHOLHDL in the last 72 hours No results found for this basename: TSH,T4TOTAL,FREET3,T3FREE,THYROIDAB in the last 72 hours No results found for this basename: VITAMINB12,FOLATE,FERRITIN,TIBC,IRON,RETICCTPCT in the last 72 hours  Medications: Scheduled    . aspirin  325 mg Oral Daily  . ciprofloxacin  400 mg Intravenous  Q12H  . enoxaparin  40 mg Subcutaneous Q24H  . feeding supplement  1 Container Oral BID WC  . furosemide  40 mg Oral Daily  . glimepiride  4 mg Oral Q breakfast  . insulin aspart  0-20 Units Subcutaneous TID WC  . insulin aspart  0-5 Units Subcutaneous QHS  . insulin aspart  2 Units Subcutaneous TID WC  . metFORMIN  1,000 mg Oral BID  . metoprolol tartrate  25 mg Oral BID  . pantoprazole  40 mg Oral Q1200  . simvastatin  20 mg Oral q1800  . sodium chloride  10-40 mL Intracatheter Q12H  . DISCONTD: insulin aspart  4 Units Subcutaneous TID WC     Radiology/Studies:  Ct Chest W Contrast  09/09/2012  *RADIOLOGY REPORT*  Clinical Data: Evaluate sternal wound.  8 days postoperative following debridement of sternal wound.  Pain and stiffness at site of sternal wound vac.  CT CHEST WITH CONTRAST  Technique:  Multidetector CT imaging of the chest was performed following the standard protocol during bolus administration of intravenous contrast.  Contrast: 132mL OMNIPAQUE IOHEXOL 300 MG/ML  SOLN  Comparison: Chest CT 08/20/2012.  Findings:  Mediastinum: 10:00 a.m.  Heart size is mildly enlarged. Small amount of pericardial fluid and/or thickening, similar to the prior examination. Small amount of stranding in the anterior mediastinal fat, similar to the prior examination.  No definite focal substernal fluid collection to suggest the presence of a mediastinal abscess at this time.  No definite pathologically enlarged mediastinal or hilar lymph nodes. Esophagus is unremarkable in appearance.  Enlarged and heterogeneous appearing thyroid gland, particularly in the right lobe, likely representing a goiter.  Lungs/Pleura: Areas of dependent atelectasis are noted in the lower lobes of the lungs bilaterally.  No acute consolidative airspace disease.  No pleural effusions.  No definite suspicious appearing pulmonary nodules or masses.  Upper Abdomen: Unremarkable.  Musculoskeletal: Multiple sternal wires are again  noted.  Compared to the prior examination, the third sternal wire and the wire in the left side of the sternum at that level have both been removed. There continues to be a lucency in the left side of the sternum with some gas which extends to the overlying skin surface. Image 21 of series 2 demonstrates a poorly defined 2.0 x 2.4 cm low attenuation collection of fluid and gas, that may represent a small abscess in the soft tissues immediately anterior to the sternum. Sagittal reconstructions on today's examination demonstrate discontinuity in the sternum, which may represent a postoperative defect or a fracture (best demonstrated on images 64 - 66 of series 6).  A wound of vacuum is in place anterior to the sternum, and there is soft tissue stranding in this region.  IMPRESSION: 1.  Interval revision of sternotomy, as above, with persistent lucency and gas in the left side of the sternum.  Compared to the prior examination, there is a small fluid collection anterior to the sternum, which may be postoperative, or could be a developing abscess.  There is also a postoperative defect or nondisplaced fracture in the right side of the sternum. Clinical correlation is recommended and attention on follow-up studies may be warranted.  These results were called by telephone on 09/09/2012 at 02:50 p.m. to Dr. Prescott Gum, who verbally acknowledged these results.   Original Report Authenticated By: Etheleen Mayhew, M.D.     INR: Will add last result for INR, ABG once components are confirmed Will add last 4 CBG results once components are confirmed  Assessment/Plan: S/P Procedure(s) (LRB): STERNAL WOUND DEBRIDEMENT (N/A)  1. Stable , for plastics eval next week. 2 conts current rx/tx     LOS: 26 days    [name],[name] 9/14/201310:37 AM    Patient seen and examined. Agree with above.

## 2012-09-11 NOTE — Progress Notes (Signed)
1500-1510 Cardiac Rehab Have attempted X 2 to offer ambulation. Pt has declined both times.

## 2012-09-12 LAB — GLUCOSE, CAPILLARY
Glucose-Capillary: 141 mg/dL — ABNORMAL HIGH (ref 70–99)
Glucose-Capillary: 157 mg/dL — ABNORMAL HIGH (ref 70–99)
Glucose-Capillary: 88 mg/dL (ref 70–99)
Glucose-Capillary: 130 mg/dL — ABNORMAL HIGH (ref 70–99)

## 2012-09-12 MED ORDER — CIPROFLOXACIN HCL 500 MG PO TABS
500.0000 mg | ORAL_TABLET | Freq: Two times a day (BID) | ORAL | Status: DC
  Administered 2012-09-12 – 2012-09-13 (×2): 500 mg via ORAL
  Filled 2012-09-12 (×5): qty 1

## 2012-09-12 NOTE — Progress Notes (Signed)
PHARMACIST - PHYSICIAN COMMUNICATION DR:   Prescott Gum CONCERNING: Antibiotic IV to Oral Route Change Policy  RECOMMENDATION: This patient is receiving cipro by the intravenous route.  Based on criteria approved by the Pharmacy and Therapeutics Committee, the antibiotic(s) is/are being converted to the equivalent oral dose form(s).   DESCRIPTION: These criteria include:  Patient being treated for a respiratory tract infection, urinary tract infection, or cellulitis  The patient is not neutropenic and does not exhibit a GI malabsorption state  The patient is eating (either orally or via tube) and/or has been taking other orally administered medications for a least 24 hours  The patient is improving clinically and has a Tmax < 100.5  If you have questions about this conversion, please contact the Pharmacy Department  []   [phone] )  [name] [name] [x]   [phone] )  [name] [name]  []   [phone] )  [facility] []   [phone] )  [facility]

## 2012-09-12 NOTE — Progress Notes (Addendum)
                   [address]            [place] [address]          [phone]     11 Days Post-Op  Procedure(s) (LRB): STERNAL WOUND DEBRIDEMENT (N/A) Subjective: No new issues  Objective  Telemetry sinus/ occ pvc's   Temp:  [98.2 F (36.8 C)-98.8 F (37.1 C)] 98.8 F (37.1 C) (09/15 0452) Pulse Rate:  [71-80] 73  (09/15 0946) Resp:  [16-20] 20  (09/15 0452) BP: (118-125)/(65-87) 119/71 mmHg (09/15 0946) SpO2:  [96 %-97 %] 96 % (09/15 0452)   Intake/Output Summary (Last 24 hours) at 09/12/12 0956 Last data filed at 09/12/12 [id number]  Gross per 24 hour  Intake    440 ml  Output     11 ml  Net    429 ml       General appearance: alert, cooperative and no distress Heart: regular rate and rhythm Lungs: clear to auscultation bilaterally Wound: stable with vac in place  Lab Results: No results found for this basename: NA:2,K:2,CL:2,CO2:2,GLUCOSE:2,BUN:2,CREATININE:2,CALCIUM:2,MG:2,PHOS:2 in the last 72 hours No results found for this basename: AST:2,ALT:2,ALKPHOS:2,BILITOT:2,PROT:2,ALBUMIN:2 in the last 72 hours No results found for this basename: LIPASE:2,AMYLASE:2 in the last 72 hours No results found for this basename: WBC:2,NEUTROABS:2,HGB:2,HCT:2,MCV:2,PLT:2 in the last 72 hours No results found for this basename: CKTOTAL:4,CKMB:4,TROPONINI:4 in the last 72 hours No components found with this basename: POCBNP:3 No results found for this basename: DDIMER in the last 72 hours No results found for this basename: HGBA1C in the last 72 hours No results found for this basename: CHOL,HDL,LDLCALC,TRIG,CHOLHDL in the last 72 hours No results found for this basename: TSH,T4TOTAL,FREET3,T3FREE,THYROIDAB in the last 72 hours No results found for this basename: VITAMINB12,FOLATE,FERRITIN,TIBC,IRON,RETICCTPCT in the last 72 hours  Medications: Scheduled    . aspirin  325 mg Oral Daily  . ciprofloxacin  400 mg Intravenous Q12H  . enoxaparin  40 mg  Subcutaneous Q24H  . feeding supplement  1 Container Oral BID WC  . furosemide  40 mg Oral Daily  . glimepiride  4 mg Oral Q breakfast  . insulin aspart  0-20 Units Subcutaneous TID WC  . insulin aspart  0-5 Units Subcutaneous QHS  . insulin aspart  2 Units Subcutaneous TID WC  . metFORMIN  1,000 mg Oral BID  . metoprolol tartrate  25 mg Oral BID  . pantoprazole  40 mg Oral Q1200  . simvastatin  20 mg Oral q1800  . sodium chloride  10-40 mL Intracatheter Q12H     Radiology/Studies:  No results found.  INR: Will add last result for INR, ABG once components are confirmed Will add last 4 CBG results once components are confirmed  Assessment/Plan: S/P Procedure(s) (LRB): STERNAL WOUND DEBRIDEMENT (N/A)  1. Stable on current rx/tx      LOS: 27 days    [name] [name],[name] [name] 9/15/20139:56 AM    Patient seen and examined. Agree with above

## 2012-09-13 ENCOUNTER — Encounter (HOSPITAL_COMMUNITY): Payer: Self-pay | Admitting: Physician Assistant

## 2012-09-13 LAB — GLUCOSE, CAPILLARY
Glucose-Capillary: 105 mg/dL — ABNORMAL HIGH (ref 70–99)
Glucose-Capillary: 101 mg/dL — ABNORMAL HIGH (ref 70–99)
Glucose-Capillary: 72 mg/dL (ref 70–99)
Glucose-Capillary: 86 mg/dL (ref 70–99)

## 2012-09-13 MED ORDER — CIPROFLOXACIN IN D5W 400 MG/200ML IV SOLN
400.0000 mg | Freq: Two times a day (BID) | INTRAVENOUS | Status: DC
  Administered 2012-09-13 – 2012-09-19 (×13): 400 mg via INTRAVENOUS
  Filled 2012-09-13 (×15): qty 200

## 2012-09-13 NOTE — Progress Notes (Signed)
1450 Cardiac Rehab Pt declines ambulation states "I am not walking today, I do not feel like it".

## 2012-09-13 NOTE — Progress Notes (Addendum)
12 Days Post-Op Procedure(s) (LRB): STERNAL WOUND DEBRIDEMENT (N/A) Subjective:  [name] [name] has no complaints this morning.    Objective: Vital signs in last 24 hours: Temp:  [97.4 F (36.3 C)-99.1 F (37.3 C)] 97.4 F (36.3 C) (09/16 0458) Pulse Rate:  [73-78] 73  (09/16 0458) Cardiac Rhythm:  [-] Normal sinus rhythm (09/15 2145) Resp:  [16-18] 18  (09/16 0458) BP: (113-141)/(71-89) 113/81 mmHg (09/16 0458) SpO2:  [96 %-97 %] 97 % (09/16 0458)  Intake/Output from previous day: 09/15 0701 - 09/16 0700 In: 480 [P.O.:480] Out: 15 [Drains:15]  General appearance: alert, cooperative and no distress Heart: regular rate and rhythm, S1, S2 normal, no murmur, click, rub or gallop Lungs: clear to auscultation bilaterally Abdomen: soft, non-tender; bowel sounds normal; no masses,  no organomegaly Extremities: edema 1+ Wound: wound vac in place,   Lab Results: No results found for this basename: WBC:2,HGB:2,HCT:2,PLT:2 in the last 72 hours BMET: No results found for this basename: NA:2,K:2,CL:2,CO2:2,GLUCOSE:2,BUN:2,CREATININE:2,CALCIUM:2 in the last 72 hours  PT/INR: No results found for this basename: LABPROT,INR in the last 72 hours ABG    Component Value Date/Time   PHART 7.460* 08/16/2012 1330   HCO3 26.8* 08/16/2012 1330   TCO2 27.9 08/16/2012 1330   ACIDBASEDEF 1.0 07/09/2012 2154   O2SAT 94.7 08/16/2012 1330   CBG (last 3)   Basename 09/13/12 0538 09/12/12 2032 09/12/12 1615  GLUCAP 105* 130* 88    Assessment/Plan: S/P Procedure(s) (LRB): STERNAL WOUND DEBRIDEMENT (N/A)  1.Wound Care- vac to be changed today 2. IV ABX converted to PO per pharmacy 3. Continue current care   LOS: 28 days    [name], [name] 09/13/2012   Cont cipro__ IV__ for prob sternal osteomeyitis

## 2012-09-13 NOTE — Progress Notes (Signed)
Hypoglycemic Event  CBG: 63  Treatment: 15 GM carbohydrate snack  Symptoms: None  Follow-up CBG: Time:1715 CBG Result:87  Possible Reasons for Event: Unknown  Comments/MD notified:none    [name], [name] Retort  Remember to initiate Hypoglycemia Order Set & complete

## 2012-09-13 NOTE — Consult Note (Signed)
Reason for Consult: Sternal ulcer, chronic Referring Physician: Dr. Prescott Gum  [name] [name] is an 61 y.o. female.  HPI: [name] [name] is a 61 yo obese African American female with a history of poorly controlled DM, and history of cocaine and tobacco abuse who underwent CABG x 3 on 07/09/12. She developed a superficial sternal wound infection with dehiscence of the wound. Wound cultures obtained previously had grown Proteus and Klebsiella and Morganella more recently.  She had been on antibiotics, but was not consistently taking them at home and developed worsening of the wound over the past month. She required readmission for IV antibiotics and has undergone several debridement procedures with placement of Acell and negative pressure wound therapy. According to the last WOCN notes, her wound has improved with this treatment and the wound now measures 8cm x 5cm with 90% granulation and  little depth except for one area at 12 o'clock which has 2.5cm of depth. We are being consulted for management of the sternal ulcer at this point.   Past Medical History  Diagnosis Date  . Hypertension   . Diabetes mellitus     diagnosed in 2008  . Glaucoma   . Hyperlipidemia   . CVA (cerebral infarction)     right internal capsule stroke in 12/2006  . Left-sided sensory deficit present   . Cocaine abuse     crack cocaine heavily until 2008 then sporatic use since then  . Tobacco abuse   . Thyroid nodule     FNA in [id number] showed follicular cells but not definate neoplasm  . Coronary artery disease   . Myocardial infarction   . Anginal pain   . Stroke     Past Surgical History  Procedure Date  . Coronary artery bypass graft 07/09/2012    Procedure: CORONARY ARTERY BYPASS GRAFTING (CABG);  Surgeon: Ivin Poot, MD;  Location: [facility];  Service: Open Heart Surgery;  Laterality: N/A;  . Cardiac catheterization   . Sternal wound debridement 08/17/2012    Procedure: STERNAL WOUND DEBRIDEMENT;  Surgeon: Ivin Poot, MD;  Location: [place];  Service: Thoracic;  Laterality: N/A;  wound vac application  . Sternal wound debridement 08/24/2012    Procedure: STERNAL WOUND DEBRIDEMENT;  Surgeon: Ivin Poot, MD;  Location: [place];  Service: Thoracic;  Laterality: N/A;  . Sternal wound debridement 09/01/2012    Procedure: STERNAL WOUND DEBRIDEMENT;  Surgeon: Ivin Poot, MD;  Location: [facility] OR;  Service: Thoracic;  Laterality: N/A;    Family History  Problem Relation Age of Onset  . Other      no known family CAD    Social History:  reports that she has been smoking.  She does not have any smokeless tobacco history on file. She reports that she uses illicit drugs (Cocaine). She reports that she does not drink alcohol.  Allergies: No Known Allergies  Medications:  I have reviewed the patient's current medications.    Results for orders placed during the hospital encounter of 08/16/12 (from the past 48 hour(s))  GLUCOSE, CAPILLARY     Status: Abnormal   Collection Time   09/11/12  8:52 PM      Component Value Range Comment   Glucose-Capillary 133 (*) 70 - 99 mg/dL   GLUCOSE, CAPILLARY     Status: Abnormal   Collection Time   09/12/12  5:58 AM      Component Value Range Comment   Glucose-Capillary 141 (*) 70 - 99  mg/dL   GLUCOSE, CAPILLARY     Status: Abnormal   Collection Time   09/12/12 11:15 AM      Component Value Range Comment   Glucose-Capillary 157 (*) 70 - 99 mg/dL    Comment 1 Notify RN     GLUCOSE, CAPILLARY     Status: Normal   Collection Time   09/12/12  4:15 PM      Component Value Range Comment   Glucose-Capillary 88  70 - 99 mg/dL    Comment 1 Notify RN     GLUCOSE, CAPILLARY     Status: Abnormal   Collection Time   09/12/12  8:32 PM      Component Value Range Comment   Glucose-Capillary 130 (*) 70 - 99 mg/dL   GLUCOSE, CAPILLARY     Status: Abnormal   Collection Time   09/13/12  5:38 AM      Component Value Range Comment   Glucose-Capillary 105 (*) 70 - 99 mg/dL     GLUCOSE, CAPILLARY     Status: Abnormal   Collection Time   09/13/12 11:27 AM      Component Value Range Comment   Glucose-Capillary 101 (*) 70 - 99 mg/dL    Comment 1 Notify RN     GLUCOSE, CAPILLARY     Status: Normal   Collection Time   09/13/12  4:05 PM      Component Value Range Comment   Glucose-Capillary 72  70 - 99 mg/dL    Comment 1 Notify RN       No results found.  Review of Systems  Constitutional: Positive for malaise/fatigue.  HENT: Negative.   Eyes: Negative.   Respiratory: Negative.   Cardiovascular: Negative.   Gastrointestinal: Negative.   Genitourinary: Negative.   Musculoskeletal: Negative.   Skin: Negative.        Other than the sternal wound  Neurological: Positive for weakness.  Endo/Heme/Allergies: Negative.   Psychiatric/Behavioral: Negative.    Blood pressure 119/57, pulse 87, temperature 98.7 F (37.1 C), temperature source Oral, resp. rate 18, height 5\' 9"  (1.753 m), weight 118.48 kg (261 lb 3.2 oz), SpO2 99.00%. Physical Exam  Constitutional: She is oriented to person, place, and time. She appears well-developed and well-nourished.       Obese female in NAD  HENT:  Head: Normocephalic and atraumatic.  Mouth/Throat: Oropharynx is clear and moist.  Neck: Normal range of motion. Neck supple. No JVD present. No tracheal deviation present.  Cardiovascular: Normal rate, regular rhythm, normal heart sounds and intact distal pulses.  Exam reveals no gallop and no friction rub.   No murmur heard.      Mild peripheral edema  Respiratory: Effort normal and breath sounds normal. No stridor. No respiratory distress. She has no wheezes. She has no rales.       VAC dressing intact over central sternal area  GI: Soft. Bowel sounds are normal. She exhibits no distension and no mass. There is no tenderness. There is no guarding.  Musculoskeletal: Normal range of motion.  Neurological: She is alert and oriented to person, place, and time.  Skin: Skin is warm  and dry.  Psychiatric: She has a normal mood and affect. Her behavior is normal. Judgment and thought content normal.    Assessment/Plan: Sternal ulcer following CABG x 3 with polymicrobial superficial wound infection- now much improved following multiple debridements and placement of Acell and VAC. She will likely require STSG to the area. Was unable to view the  wound today as the patient did not want the VAC dressing removed at this time. Will follow up again on 09/15/12 in the early am prior to [facility] dressing change to assess the wound and discuss options for coverage of the wound.  Thank you for the consult.  RAYBURN,SHAWN,PA-C Plastic Surgery [phone]  I have seen the patient and performed and physical.  I agree with the above information.  I have spoken with Dr. Prescott Gum and we will plan to do a further I&D on Monday.  If there is not improvement in a week the patient will need a muscle flap for coverage.  [employer], DO

## 2012-09-14 LAB — GLUCOSE, CAPILLARY
Glucose-Capillary: 75 mg/dL (ref 70–99)
Glucose-Capillary: 150 mg/dL — ABNORMAL HIGH (ref 70–99)
Glucose-Capillary: 69 mg/dL — ABNORMAL LOW (ref 70–99)
Glucose-Capillary: 78 mg/dL (ref 70–99)

## 2012-09-14 NOTE — Progress Notes (Signed)
13 Days Post-Op Procedure(s) (LRB): STERNAL WOUND DEBRIDEMENT (N/A) Subjective:  [name] [name] without complaints this morning.   Objective: Vital signs in last 24 hours: Temp:  [97.2 F (36.2 C)-98.8 F (37.1 C)] 97.2 F (36.2 C) (09/17 0636) Pulse Rate:  [71-87] 71  (09/17 0636) Cardiac Rhythm:  [-] Normal sinus rhythm (09/17 0735) Resp:  [18-20] 18  (09/17 0636) BP: (118-126)/(57-93) 120/93 mmHg (09/17 0636) SpO2:  [96 %-100 %] 100 % (09/17 0636) Weight:  [260 lb 8 oz (118.162 kg)] 260 lb 8 oz (118.162 kg) (09/17 0636)  Intake/Output from previous day: 09/16 0701 - 09/17 0700 In: 920 [P.O.:480; I.V.:240; IV Piggyback:200] Out: 20 [Drains:20]  General appearance: alert, cooperative and no distress Heart: regular rate and rhythm Lungs: clear to auscultation bilaterally Abdomen: soft, non-tender; bowel sounds normal; no masses,  no organomegaly Extremities: extremities normal, atraumatic, no cyanosis or edema Wound: wound vac in place  Lab Results: No results found for this basename: WBC:2,HGB:2,HCT:2,PLT:2 in the last 72 hours BMET: No results found for this basename: NA:2,K:2,CL:2,CO2:2,GLUCOSE:2,BUN:2,CREATININE:2,CALCIUM:2 in the last 72 hours  PT/INR: No results found for this basename: LABPROT,INR in the last 72 hours ABG    Component Value Date/Time   PHART 7.460* 08/16/2012 1330   HCO3 26.8* 08/16/2012 1330   TCO2 27.9 08/16/2012 1330   ACIDBASEDEF 1.0 07/09/2012 2154   O2SAT 94.7 08/16/2012 1330   CBG (last 3)   Basename 09/14/12 0622 09/13/12 2114 09/13/12 1605  GLUCAP 75 86 72    Assessment/Plan: S/P Procedure(s) (LRB): STERNAL WOUND DEBRIDEMENT (N/A)  1. Wound Care- continue vac changes MWF 2. 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 Continue current care   LOS: 29 days    [name] [name], [name] [name] 09/14/2012

## 2012-09-14 NOTE — Progress Notes (Signed)
CARDIAC REHAB PHASE I   PRE:  Rate/Rhythm: 69SR  BP:  Supine:   Sitting: 100/70  Standing:    SaO2:   MODE:  Ambulation: 420 ft   POST:  Rate/Rhythem: 84  BP:  Supine:   Sitting: 110/60  Standing:    SaO2: 95%RA 1450-1507 Pt walked 420 ft on RA with asst x 1 after much encouragement from family member. Tolerated well. Did not want to go farther. To sitting on side of bed after walk.  Jeani Sow

## 2012-09-15 LAB — GLUCOSE, CAPILLARY
Glucose-Capillary: 114 mg/dL — ABNORMAL HIGH (ref 70–99)
Glucose-Capillary: 106 mg/dL — ABNORMAL HIGH (ref 70–99)
Glucose-Capillary: 67 mg/dL — ABNORMAL LOW (ref 70–99)
Glucose-Capillary: 129 mg/dL — ABNORMAL HIGH (ref 70–99)

## 2012-09-15 MED ORDER — DEXTROSE 5 % IV SOLN
1.5000 g | INTRAVENOUS | Status: AC
  Administered 2012-09-20: 1.5 g via INTRAVENOUS
  Filled 2012-09-15 (×2): qty 1.5

## 2012-09-15 NOTE — Progress Notes (Signed)
Inpatient Diabetes Program Recommendations  AACE/ADA: New Consensus Statement on Inpatient Glycemic Control (2013)  Target Ranges:  Prepandial:   less than 140 mg/dL      Peak postprandial:   less than 180 mg/dL (1-2 hours)      Critically ill patients:  140 - 180 mg/dL   Reason for Visit: Results for [name] [name], [name] [name] (MRN [id number]) as of 09/15/2012 09:59  Ref. Range 09/14/2012 06:22 09/14/2012 10:58 09/14/2012 16:00 09/14/2012 20:24 09/15/2012 05:56  Glucose-Capillary Latest Range: 70-99 mg/dL 75 150 (H) 69 (L) 78 114 (H)   Please decrease correction to sensitive tid with meals. Will follow.

## 2012-09-15 NOTE — Progress Notes (Signed)
Sternal wound seen and examined by plastics. Order to continue wound vac. Dr. Prescott Gum saw patient and redressed sternal wound and applied wound vac dressing to continuous suction.

## 2012-09-15 NOTE — Progress Notes (Signed)
CARDIAC REHAB PHASE I   PRE:  Rate/Rhythm: 49 SR    BP: at door, ready to walk    SaO2:   MODE:  Ambulation: 400 ft   POST:  Rate/Rhythm: 110    BP: sitting 120/60     SaO2: 97 RA  Pt to BR on arrival, then agreed to walk. Sts she wants to walk and eat right to prepare for surgery Monday. Pt in pain walking due to wound change this am. Endured and to reliner after walk. Encouraged and congratulated pt.  [id number]  Darrick Meigs CES, ACSM

## 2012-09-15 NOTE — Progress Notes (Signed)
Wound vac dressing removed.  Saline gauze wet-to-dry dressing applied at 0648 in preparation for the plastic surgeon.  Will continue to monitor.  Randell Patient

## 2012-09-15 NOTE — Progress Notes (Signed)
Nutrition Follow-up  Intervention:    Continue [employer] 2 times daily (250 kcals, 9 gm protein per 8 fl oz carton) RD to follow for nutrition care plan  Assessment:   Patient's PO intake 75-100% per flowsheet records. Drinking [employer] supplements. Per patient, plastic surgery procedure to her sternal wound likely Monday, 9/23.  Diet Order:  Carbohydrate Modified Medium Calorie  Meds: Scheduled Meds:   . aspirin  325 mg Oral Daily  . ciprofloxacin  400 mg Intravenous Q12H  . enoxaparin  40 mg Subcutaneous Q24H  . feeding supplement  1 Container Oral BID WC  . furosemide  40 mg Oral Daily  . glimepiride  4 mg Oral Q breakfast  . insulin aspart  0-20 Units Subcutaneous TID WC  . insulin aspart  0-5 Units Subcutaneous QHS  . insulin aspart  2 Units Subcutaneous TID WC  . metFORMIN  1,000 mg Oral BID  . metoprolol tartrate  25 mg Oral BID  . pantoprazole  40 mg Oral Q1200  . simvastatin  20 mg Oral q1800  . sodium chloride  10-40 mL Intracatheter Q12H   Continuous Infusions:  PRN Meds:.sodium chloride, acetaminophen, lactulose, magnesium hydroxide, oxyCODONE, sodium chloride, traMADol  Labs:  CMP     Component Value Date/Time   NA 139 09/02/2012 0516   K 3.6 09/02/2012 0516   CL 100 09/02/2012 0516   CO2 26 09/02/2012 0516   GLUCOSE 59* 09/02/2012 0516   BUN 16 09/02/2012 0516   CREATININE 1.15* 09/02/2012 0516   CALCIUM 9.4 09/02/2012 0516   PROT 6.6 08/31/2012 2134   ALBUMIN 3.6 08/31/2012 2134   AST 41* 08/31/2012 2134   ALT 29 08/31/2012 2134   ALKPHOS 56 08/31/2012 2134   BILITOT 0.3 08/31/2012 2134   GFRNONAA 50* 09/02/2012 0516   GFRAA 58* 09/02/2012 0516     Intake/Output Summary (Last 24 hours) at 09/15/12 1038 Last data filed at 09/15/12 0650  Gross per 24 hour  Intake 1356.67 ml  Output    625 ml  Net 731.67 ml    CBG (last 3)   Basename 09/15/12 0556 09/14/12 2024 09/14/12 1600  GLUCAP 114* 78 69*    Weight Status:  118 kg (9/17) -- fluctuating    Re-estimated needs: 1800-2000 kcals, 110-120 gm protein   Nutrition Dx: Increased Nutrient Needs, ongoing   Goal: Oral intake with meals & supplements to meet >/= 90% of estimated nutrition needs, met   Monitor: PO & supplemental intake, weight, labs, I/O's   Phillips Odor, RD, LDN  Pager #: [phone]  After-Hours Pager #: [phone]

## 2012-09-15 NOTE — Progress Notes (Addendum)
14 Days Post-Op Procedure(s) (LRB): STERNAL WOUND DEBRIDEMENT (N/A) Subjective: [name] without complaints this morning.  Objective: Vital signs in last 24 hours: Temp:  [98.1 F (36.7 C)-98.7 F (37.1 C)] 98.6 F (37 C) (09/18 0519) Pulse Rate:  [66-80] 72  (09/18 0519) Cardiac Rhythm:  [-] Normal sinus rhythm (09/17 0815) Resp:  [16-18] 18  (09/18 0519) BP: (109-154)/(71-90) 112/74 mmHg (09/18 0519) SpO2:  [98 %-100 %] 98 % (09/18 0519)   Intake/Output from previous day: 09/17 0701 - 09/18 0700 In: 940 [P.O.:720; I.V.:220] Out: 625 [Urine:600; Drains:25]  General appearance: alert, cooperative and no distress Heart: regular rate and rhythm Lungs: clear to auscultation bilaterally Abdomen: soft, non-tender; bowel sounds normal; no masses,  no organomegaly Extremities: extremities normal, atraumatic, no cyanosis or edema Wound: approximately 7cm wound along sternotomy incision, wound is clean +granulation tissue present  Lab Results: No results found for this basename: WBC:2,HGB:2,HCT:2,PLT:2 in the last 72 hours BMET: No results found for this basename: NA:2,K:2,CL:2,CO2:2,GLUCOSE:2,BUN:2,CREATININE:2,CALCIUM:2 in the last 72 hours  PT/INR: No results found for this basename: LABPROT,INR in the last 72 hours ABG    Component Value Date/Time   PHART 7.460* 08/16/2012 1330   HCO3 26.8* 08/16/2012 1330   TCO2 27.9 08/16/2012 1330   ACIDBASEDEF 1.0 07/09/2012 2154   O2SAT 94.7 08/16/2012 1330   CBG (last 3)   Basename 09/15/12 0556 09/14/12 2024 09/14/12 1600  GLUCAP 114* 78 69*    Assessment/Plan: S/P Procedure(s) (LRB): STERNAL WOUND DEBRIDEMENT (N/A)  1.Wound care- dressing to be changed today 2. 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 Continue current care   LOS: 30 days    [name] [name] 09/15/2012  I examined the wound today and change the VAC sponge. The very upper portion of the incision has some  exposed sternal bone which needs debridement. This will be performed under anesthesia in the OR on Monday with Dr. [name]. Procedure and plan discussed with patient. The remainder of the wound has healthy clean granulation tissue

## 2012-09-16 LAB — URINALYSIS, ROUTINE W REFLEX MICROSCOPIC
Bilirubin Urine: NEGATIVE
Glucose, UA: NEGATIVE mg/dL
Ketones, ur: NEGATIVE mg/dL
Nitrite: NEGATIVE
Protein, ur: NEGATIVE mg/dL
Specific Gravity, Urine: 1.011 (ref 1.005–1.030)
Urobilinogen, UA: 0.2 mg/dL (ref 0.0–1.0)
pH: 5 (ref 5.0–8.0)

## 2012-09-16 LAB — GLUCOSE, CAPILLARY
Glucose-Capillary: 113 mg/dL — ABNORMAL HIGH (ref 70–99)
Glucose-Capillary: 129 mg/dL — ABNORMAL HIGH (ref 70–99)
Glucose-Capillary: 94 mg/dL (ref 70–99)
Glucose-Capillary: 143 mg/dL — ABNORMAL HIGH (ref 70–99)

## 2012-09-16 LAB — URINE MICROSCOPIC-ADD ON

## 2012-09-16 MED ORDER — INSULIN ASPART 100 UNIT/ML ~~LOC~~ SOLN
0.0000 [IU] | Freq: Three times a day (TID) | SUBCUTANEOUS | Status: DC
  Administered 2012-09-16: 1 [IU] via SUBCUTANEOUS
  Administered 2012-09-20: 100 [IU] via SUBCUTANEOUS
  Administered 2012-09-21 – 2012-09-22 (×3): 2 [IU] via SUBCUTANEOUS
  Administered 2012-09-24: 1 [IU] via SUBCUTANEOUS

## 2012-09-16 NOTE — Progress Notes (Signed)
CARDIAC REHAB PHASE I   PRE:  Rate/Rhythm: 71SR  BP:  Supine:   Sitting: 104/70  Standing:    SaO2:   MODE:  Ambulation: 500 ft   POST:  Rate/Rhythem: 85  BP:  Supine:   Sitting: 110/70  Standing:    SaO2: 95%RA 1438-1455 Pt ready to walk. Tolerated well with steady gait. To recliner after walk.  Would like more freedom in selection of food she states.  Jeani Sow

## 2012-09-16 NOTE — Progress Notes (Addendum)
15 Days Post-Op Procedure(s) (LRB): STERNAL WOUND DEBRIDEMENT (N/A)  Subjective: [name] [name] has no complaints this morning.    Objective: Vital signs in last 24 hours: Temp:  [98 F (36.7 C)-98.4 F (36.9 C)] 98.4 F (36.9 C) (09/19 0640) Pulse Rate:  [60-71] 69  (09/19 0640) Cardiac Rhythm:  [-] Normal sinus rhythm (09/18 2118) Resp:  [16-18] 17  (09/19 0640) BP: (110-124)/(67-97) 124/82 mmHg (09/19 0640) SpO2:  [95 %-99 %] 99 % (09/19 0640) Weight:  [260 lb 12.9 oz (118.3 kg)] 260 lb 12.9 oz (118.3 kg) (09/19 0640)  Intake/Output from previous day: 09/18 0701 - 09/19 0700 In: 490 [P.O.:480; I.V.:10] Out: 950 [Urine:950]  General appearance: alert, cooperative and no distress Heart: regular rate and rhythm Lungs: clear to auscultation bilaterally Abdomen: soft, non-tender; bowel sounds normal; no masses,  no organomegaly Extremities: extremities normal, atraumatic, no cyanosis or edema Wound: wound vac in place, no surrounding erythema present  Lab Results: No results found for this basename: WBC:2,HGB:2,HCT:2,PLT:2 in the last 72 hours BMET: No results found for this basename: NA:2,K:2,CL:2,CO2:2,GLUCOSE:2,BUN:2,CREATININE:2,CALCIUM:2 in the last 72 hours  PT/INR: No results found for this basename: LABPROT,INR in the last 72 hours ABG    Component Value Date/Time   PHART 7.460* 08/16/2012 1330   HCO3 26.8* 08/16/2012 1330   TCO2 27.9 08/16/2012 1330   ACIDBASEDEF 1.0 07/09/2012 2154   O2SAT 94.7 08/16/2012 1330   CBG (last 3)   Basename 09/16/12 0642 09/15/12 2054 09/15/12 1636  GLUCAP 113* 129* 67*    Assessment/Plan: S/P Procedure(s) (LRB): STERNAL WOUND DEBRIDEMENT (N/A)  1.Wound care- continue vac changes MWF 2. 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 Plastics consult- recommending I&D on Monday 4. Continue Current care   LOS: 31 days    [name], [name] 09/16/2012  patient examined and medical record reviewed,agree with above note.  Change VAC  Fri, OR debridement w/ Dr [name] - Plastics Mon [facility]T [name] [name] 09/16/2012

## 2012-09-17 LAB — GLUCOSE, CAPILLARY
Glucose-Capillary: 79 mg/dL (ref 70–99)
Glucose-Capillary: 103 mg/dL — ABNORMAL HIGH (ref 70–99)
Glucose-Capillary: 62 mg/dL — ABNORMAL LOW (ref 70–99)
Glucose-Capillary: 119 mg/dL — ABNORMAL HIGH (ref 70–99)

## 2012-09-17 NOTE — Consult Note (Signed)
WOC follow up Wound type: sternum s/p debridement Measurement: 8cm x 5cm x .5cm with tunneled area at 12 oclock 1.5cm  Wound bed: 100% granulation except for the tunneled area that has some slough present Drainage (amount, consistency, odor) serosanguinous in canister Periwound: intact without problems Dressing procedure/placement/frequency: 1pc of black granufoam cut to fit and placed into wound bed, drape applied and seal obtained at 114mm HG.  Pt tolerated well.   WOC will follow along with you, for surgical intervention on Monday per plastics. Explained to pt that they would complete dressing change in OR on Monday. [place], [place]

## 2012-09-17 NOTE — Progress Notes (Signed)
16 Days Post-Op Procedure(s) (LRB): STERNAL WOUND DEBRIDEMENT (N/A)  Subjective: [name] [name] has no new complaints this morning.  Objective: Vital signs in last 24 hours: Temp:  [97.8 F (36.6 C)-98.5 F (36.9 C)] 98.5 F (36.9 C) (09/20 0513) Pulse Rate:  [68-82] 78  (09/20 0513) Cardiac Rhythm:  [-] Normal sinus rhythm (09/19 2000) Resp:  [16-18] 18  (09/20 0513) BP: (119-144)/(72-77) 128/77 mmHg (09/20 0513) SpO2:  [99 %-100 %] 99 % (09/20 0513) Weight:  [259 lb 4.8 oz (117.618 kg)] 259 lb 4.8 oz (117.618 kg) (09/20 0320)  Intake/Output from previous day: 09/19 0701 - 09/20 0700 In: 600 [P.O.:600] Out: 1150 [Urine:1150]  General appearance: alert, cooperative and no distress Heart: regular rate and rhythm Lungs: clear to auscultation bilaterally Abdomen: soft, non-tender; bowel sounds normal; no masses,  no organomegaly Wound: wound vac in place on sternotomy, no surrounding erythema  Lab Results: No results found for this basename: WBC:2,HGB:2,HCT:2,PLT:2 in the last 72 hours BMET: No results found for this basename: NA:2,K:2,CL:2,CO2:2,GLUCOSE:2,BUN:2,CREATININE:2,CALCIUM:2 in the last 72 hours  PT/INR: No results found for this basename: LABPROT,INR in the last 72 hours ABG    Component Value Date/Time   PHART 7.460* 08/16/2012 1330   HCO3 26.8* 08/16/2012 1330   TCO2 27.9 08/16/2012 1330   ACIDBASEDEF 1.0 07/09/2012 2154   O2SAT 94.7 08/16/2012 1330   CBG (last 3)   Basename 09/17/12 0607 09/16/12 2122 09/16/12 1626  GLUCAP 79 143* 94    Assessment/Plan: S/P Procedure(s) (LRB): STERNAL WOUND DEBRIDEMENT (N/A)  1. Wound Care- vac to be changed today 2. 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 Plastics consult- plan for debridement in OR on Monday 4. Continue current care    LOS: 32 days    Merriel Zinger, [name] 09/17/2012

## 2012-09-17 NOTE — Progress Notes (Signed)
1440 offered to walk with pt. Pt declined and stated she is having too much chest wound pain. Stated she had pain med. Encouraged pt to walk with staff later. Kamaree Berkel DunlapRN

## 2012-09-18 ENCOUNTER — Inpatient Hospital Stay (HOSPITAL_COMMUNITY): Payer: Medicaid Other

## 2012-09-18 LAB — COMPREHENSIVE METABOLIC PANEL
ALT: 19 U/L (ref 0–35)
AST: 21 U/L (ref 0–37)
Albumin: 2.9 g/dL — ABNORMAL LOW (ref 3.5–5.2)
Alkaline Phosphatase: 90 U/L (ref 39–117)
BUN: 16 mg/dL (ref 6–23)
CO2: 28 mEq/L (ref 19–32)
Calcium: 9.4 mg/dL (ref 8.4–10.5)
Chloride: 101 mEq/L (ref 96–112)
Creatinine, Ser: 1.15 mg/dL — ABNORMAL HIGH (ref 0.50–1.10)
GFR calc Af Amer: 58 mL/min — ABNORMAL LOW (ref >90)
GFR calc non Af Amer: 50 mL/min — ABNORMAL LOW (ref >90)
Glucose, Bld: 91 mg/dL (ref 70–99)
Potassium: 3.6 mEq/L (ref 3.5–5.1)
Sodium: 139 mEq/L (ref 135–145)
Total Bilirubin: 0.2 mg/dL — ABNORMAL LOW (ref 0.3–1.2)
Total Protein: 6.8 g/dL (ref 6.0–8.3)

## 2012-09-18 LAB — GLUCOSE, CAPILLARY
Glucose-Capillary: 92 mg/dL (ref 70–99)
Glucose-Capillary: 101 mg/dL — ABNORMAL HIGH (ref 70–99)
Glucose-Capillary: 121 mg/dL — ABNORMAL HIGH (ref 70–99)
Glucose-Capillary: 113 mg/dL — ABNORMAL HIGH (ref 70–99)
Glucose-Capillary: 81 mg/dL (ref 70–99)

## 2012-09-18 LAB — CBC
HCT: 32.4 % — ABNORMAL LOW (ref 36.0–46.0)
Hemoglobin: 10.2 g/dL — ABNORMAL LOW (ref 12.0–15.0)
MCH: 24.7 pg — ABNORMAL LOW (ref 26.0–34.0)
MCHC: 31.5 g/dL (ref 30.0–36.0)
MCV: 78.5 fL (ref 78.0–100.0)
Platelets: 342 10*3/uL (ref 150–400)
RBC: 4.13 MIL/uL (ref 3.87–5.11)
RDW: 17.9 % — ABNORMAL HIGH (ref 11.5–15.5)
WBC: 5.7 10*3/uL (ref 4.0–10.5)

## 2012-09-18 NOTE — Progress Notes (Signed)
CARDIAC REHAB PHASE I   PRE:  Rate/Rhythm: SR 76  BP:  Supine:   Sitting: 140/78  Standing:    SaO2: 99 ra  MODE:  Ambulation: 450 ft   POST:  Rate/Rhythem: 89  BP:  Supine:  Sitting: 130/70  Standing:    SaO2: 99 ra Ambulation tolerated well with no complaints. Pt to side of bed finishing breakfast.  Call bell in place.  Pt for surgery on Monday and feels very positive about the outcome.  Pt encouraged to ambulate  with nursing staff.   Verbalized understanding.  Wilber Oliphant, [place]

## 2012-09-18 NOTE — Progress Notes (Addendum)
17 Days Post-Op Procedure(s) (LRB): STERNAL WOUND DEBRIDEMENT (N/A)  Subjective: No new complaints this morning.  Objective: Vital signs in last 24 hours: Temp:  [98.1 F (36.7 C)-98.9 F (37.2 C)] 98.9 F (37.2 C) (09/21 0606) Pulse Rate:  [71-82] 71  (09/21 0606) Cardiac Rhythm:  [-] Normal sinus rhythm (09/20 2026) Resp:  [18] 18  (09/21 0606) BP: (110-128)/(72-80) 118/79 mmHg (09/21 0606) SpO2:  [99 %] 99 % (09/21 0606) Weight:  [191 lb 5.8 oz (86.8 kg)] 191 lb 5.8 oz (86.8 kg) (09/21 0606)  Intake/Output from previous day: 09/20 0701 - 09/21 0700 In: 1640 [P.O.:1640] Out: 2153 [Urine:2150; Stool:3]  General appearance: alert, cooperative and no distress Heart: regular rate and rhythm Lungs: clear to auscultation bilaterally Abdomen: soft, non-tender; bowel sounds normal Wound: wound vac in place, no surrounding erythema  Lab Results:  [facility] 09/18/12 0613  WBC 5.7  HGB 10.2*  HCT 32.4*  PLT 342   BMET:   Basename 09/18/12 0613  NA 139  K 3.6  CL 101  CO2 28  GLUCOSE 91  BUN 16  CREATININE 1.15*  CALCIUM 9.4    PT/INR: No results found for this basename: LABPROT,INR in the last 72 hours ABG    Component Value Date/Time   PHART 7.460* 08/16/2012 1330   HCO3 26.8* 08/16/2012 1330   TCO2 27.9 08/16/2012 1330   ACIDBASEDEF 1.0 07/09/2012 2154   O2SAT 94.7 08/16/2012 1330   CBG (last 3)   Basename 09/18/12 0635 09/18/12 0615 09/17/12 2050  GLUCAP 101* 92 119*    Assessment/Plan: S/P Procedure(s) (LRB): STERNAL WOUND DEBRIDEMENT (N/A)  1. Wound Care- VAC changed yesterday 2. 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 Continue current care 4. To OR for debridement on Monday   LOS: 33 days    ZIMMERMAN,DONIELLE M 09/18/2012  I have seen and examined the patient and agree with the assessment and plan as outlined.  OWEN,CLARENCE H 09/18/2012 11:33 AM

## 2012-09-19 ENCOUNTER — Other Ambulatory Visit: Payer: Self-pay | Admitting: Plastic Surgery

## 2012-09-19 DIAGNOSIS — L98499 Non-pressure chronic ulcer of skin of other sites with unspecified severity: Secondary | ICD-10-CM

## 2012-09-19 LAB — TYPE AND SCREEN
ABO/RH(D): O POS
Antibody Screen: NEGATIVE

## 2012-09-19 LAB — GLUCOSE, CAPILLARY
Glucose-Capillary: 84 mg/dL (ref 70–99)
Glucose-Capillary: 107 mg/dL — ABNORMAL HIGH (ref 70–99)
Glucose-Capillary: 136 mg/dL — ABNORMAL HIGH (ref 70–99)

## 2012-09-19 NOTE — Progress Notes (Addendum)
18 Days Post-Op Procedure(s) (LRB): STERNAL WOUND DEBRIDEMENT (N/A)  Subjective: No new complaints this morning.  Objective: Vital signs in last 24 hours: Temp:  [97.9 F (36.6 C)-98.4 F (36.9 C)] 98.4 F (36.9 C) (09/22 0404) Pulse Rate:  [71-82] 71  (09/22 0404) Cardiac Rhythm:  [-] Normal sinus rhythm (09/21 2008) Resp:  [16-18] 18  (09/22 0404) BP: (103-116)/(50-87) 116/73 mmHg (09/22 0404) SpO2:  [96 %-97 %] 96 % (09/22 0404)  Intake/Output from previous day: 09/21 0701 - 09/22 0700 In: 240 [P.O.:240] Out: -   General appearance: alert, cooperative and no distress Heart: regular rate and rhythm Lungs: clear to auscultation bilaterally Abdomen: soft, non-tender; bowel sounds normal Wound: wound vac in place, no surrounding erythema  Lab Results:  [facility] 09/18/12 0613  WBC 5.7  HGB 10.2*  HCT 32.4*  PLT 342   BMET:   Basename 09/18/12 0613  NA 139  K 3.6  CL 101  CO2 28  GLUCOSE 91  BUN 16  CREATININE 1.15*  CALCIUM 9.4    PT/INR: No results found for this basename: LABPROT,INR in the last 72 hours ABG    Component Value Date/Time   PHART 7.460* 08/16/2012 1330   HCO3 26.8* 08/16/2012 1330   TCO2 27.9 08/16/2012 1330   ACIDBASEDEF 1.0 07/09/2012 2154   O2SAT 94.7 08/16/2012 1330   CBG (last 3)   Basename 09/18/12 2141 09/18/12 1626 09/18/12 1115  GLUCAP 81 113* 121*    Assessment/Plan: S/P Procedure(s) (LRB): STERNAL WOUND DEBRIDEMENT (N/A)  1. Wound Care- VAC changed Friday. 2. 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 Continue current care 4. To OR for debridement on Monday   LOS: 34 days    ZIMMERMAN,DONIELLE M 09/19/2012 7:54 AM   I have seen and examined the patient and agree with the assessment and plan as outlined.  Mischa Pollard H 09/19/2012 3:16 PM

## 2012-09-20 ENCOUNTER — Inpatient Hospital Stay (HOSPITAL_COMMUNITY): Payer: Medicaid Other | Admitting: *Deleted

## 2012-09-20 ENCOUNTER — Encounter (HOSPITAL_COMMUNITY): Payer: Self-pay | Admitting: *Deleted

## 2012-09-20 ENCOUNTER — Encounter (HOSPITAL_COMMUNITY): Payer: Self-pay | Admitting: Plastic Surgery

## 2012-09-20 ENCOUNTER — Encounter (HOSPITAL_COMMUNITY)
Admission: AD | Disposition: A | Discharge status: Skilled Nursing Facility | Payer: Self-pay | Source: Ambulatory Visit | Attending: Cardiothoracic Surgery

## 2012-09-20 DIAGNOSIS — L02219 Cutaneous abscess of trunk, unspecified: Secondary | ICD-10-CM

## 2012-09-20 DIAGNOSIS — L03319 Cellulitis of trunk, unspecified: Secondary | ICD-10-CM

## 2012-09-20 HISTORY — PX: STERNAL WOUND DEBRIDEMENT: SHX1058

## 2012-09-20 LAB — GLUCOSE, CAPILLARY
Glucose-Capillary: 91 mg/dL (ref 70–99)
Glucose-Capillary: 111 mg/dL — ABNORMAL HIGH (ref 70–99)
Glucose-Capillary: 110 mg/dL — ABNORMAL HIGH (ref 70–99)
Glucose-Capillary: 88 mg/dL (ref 70–99)
Glucose-Capillary: 188 mg/dL — ABNORMAL HIGH (ref 70–99)
Glucose-Capillary: 154 mg/dL — ABNORMAL HIGH (ref 70–99)

## 2012-09-20 SURGERY — DEBRIDEMENT, WOUND, STERNUM
Anesthesia: General | Site: Chest | Wound class: Dirty or Infected

## 2012-09-20 MED ORDER — SODIUM CHLORIDE 0.9 % IR SOLN
Status: DC | PRN
  Administered 2012-09-20: 08:00:00

## 2012-09-20 MED ORDER — HYDROMORPHONE HCL PF 1 MG/ML IJ SOLN: 0.2500 mg | INTRAMUSCULAR | Status: DC | PRN

## 2012-09-20 MED ORDER — SUCCINYLCHOLINE CHLORIDE 20 MG/ML IJ SOLN
INTRAMUSCULAR | Status: DC | PRN
  Administered 2012-09-20: 100 mg via INTRAVENOUS

## 2012-09-20 MED ORDER — LACTATED RINGERS IV SOLN
INTRAVENOUS | Status: DC | PRN
  Administered 2012-09-20: 07:00:00 via INTRAVENOUS

## 2012-09-20 MED ORDER — DEXTROSE 5 % IV SOLN
1.0000 g | Freq: Three times a day (TID) | INTRAVENOUS | Status: DC
  Administered 2012-09-20 – 2012-09-24 (×12): 1 g via INTRAVENOUS
  Filled 2012-09-20 (×15): qty 1

## 2012-09-20 MED ORDER — PHENYLEPHRINE HCL 10 MG/ML IJ SOLN
INTRAMUSCULAR | Status: DC | PRN
  Administered 2012-09-20: 40 ug via INTRAVENOUS
  Administered 2012-09-20 (×2): 80 ug via INTRAVENOUS

## 2012-09-20 MED ORDER — PROMETHAZINE HCL 25 MG/ML IJ SOLN: 6.2500 mg | INTRAMUSCULAR | Status: DC | PRN

## 2012-09-20 MED ORDER — MEPERIDINE HCL 25 MG/ML IJ SOLN: 6.2500 mg | INTRAMUSCULAR | Status: DC | PRN

## 2012-09-20 MED ORDER — PROPOFOL 10 MG/ML IV BOLUS
INTRAVENOUS | Status: DC | PRN
  Administered 2012-09-20 (×2): 100 mg via INTRAVENOUS

## 2012-09-20 MED ORDER — SODIUM CHLORIDE 0.9 % IR SOLN
Status: DC | PRN
  Administered 2012-09-20: 3000 mL

## 2012-09-20 MED ORDER — FENTANYL CITRATE 0.05 MG/ML IJ SOLN
INTRAMUSCULAR | Status: DC | PRN
  Administered 2012-09-20 (×4): 50 ug via INTRAVENOUS

## 2012-09-20 MED ORDER — ONDANSETRON HCL 4 MG/2ML IJ SOLN
INTRAMUSCULAR | Status: DC | PRN
  Administered 2012-09-20: 4 mg via INTRAVENOUS

## 2012-09-20 MED ORDER — MIDAZOLAM HCL 2 MG/2ML IJ SOLN: 0.5000 mg | Freq: Once | INTRAMUSCULAR | Status: DC | PRN

## 2012-09-20 SURGICAL SUPPLY — 58 items
APL SKNCLS STERI-STRIP NONHPOA (GAUZE/BANDAGES/DRESSINGS)
ATTRACTOMAT 16X20 MAGNETIC DRP (DRAPES) ×4 IMPLANT
BAG DECANTER FOR FLEXI CONT (MISCELLANEOUS) ×4 IMPLANT
BENZOIN TINCTURE PRP APPL 2/3 (GAUZE/BANDAGES/DRESSINGS) IMPLANT
BINDER BREAST XXLRG (GAUZE/BANDAGES/DRESSINGS) ×3 IMPLANT
CANISTER SUCTION 2500CC (MISCELLANEOUS) ×8 IMPLANT
CANISTER WOUND CARE 500ML ATS (WOUND CARE) ×3 IMPLANT
CATH FOLEY 2WAY SLVR  5CC 16FR (CATHETERS)
CATH FOLEY 2WAY SLVR 5CC 16FR (CATHETERS) IMPLANT
CATH THORACIC 28FR RT ANG (CATHETERS) IMPLANT
CATH THORACIC 36FR (CATHETERS) IMPLANT
CATH THORACIC 36FR RT ANG (CATHETERS) IMPLANT
CLIP TI WIDE RED SMALL 24 (CLIP) IMPLANT
CONN Y 3/8X3/8X3/8  BEN (MISCELLANEOUS)
CONN Y 3/8X3/8X3/8 BEN (MISCELLANEOUS) IMPLANT
CONT SPEC 4OZ CLIKSEAL STRL BL (MISCELLANEOUS) ×3 IMPLANT
COVER SURGICAL LIGHT HANDLE (MISCELLANEOUS) ×5 IMPLANT
DRAPE INCISE 23X17 IOBAN STRL (DRAPES)
DRAPE INCISE 23X17 STRL (DRAPES) IMPLANT
DRAPE INCISE IOBAN 23X17 STRL (DRAPES) IMPLANT
DRAPE INCISE IOBAN 85X60 (DRAPES) IMPLANT
DRAPE LAPAROSCOPIC ABDOMINAL (DRAPES) ×4 IMPLANT
DRAPE PROXIMA HALF (DRAPES) ×5 IMPLANT
ELECT BLADE 6.5 EXT (BLADE) ×4 IMPLANT
ELECT CAUTERY BLADE 6.4 (BLADE) ×6 IMPLANT
ELECT REM PT RETURN 9FT ADLT (ELECTROSURGICAL) ×4
ELECTRODE REM PT RTRN 9FT ADLT (ELECTROSURGICAL) ×2 IMPLANT
GLOVE BIO SURGEON STRL SZ 6.5 (GLOVE) ×8 IMPLANT
GLOVE BIO SURGEON STRL SZ7.5 (GLOVE) ×8 IMPLANT
GLOVE BIO SURGEONS STRL SZ 6.5 (GLOVE) ×3
GLOVE BIOGEL PI IND STRL 7.0 (GLOVE) ×2 IMPLANT
GLOVE BIOGEL PI INDICATOR 7.0 (GLOVE) ×4
GOWN STRL NON-REIN LRG LVL3 (GOWN DISPOSABLE) ×17 IMPLANT
HANDPIECE INTERPULSE COAX TIP (DISPOSABLE) ×4
HEMOSTAT POWDER SURGIFOAM 1G (HEMOSTASIS) IMPLANT
HEMOSTAT SURGICEL 2X14 (HEMOSTASIS) IMPLANT
KIT BASIN OR (CUSTOM PROCEDURE TRAY) ×8 IMPLANT
KIT ROOM TURNOVER OR (KITS) ×4 IMPLANT
KIT SUCTION CATH 14FR (SUCTIONS) IMPLANT
MATRIX SURGICAL PSM 10X15CM (Tissue) ×3 IMPLANT
MICROMATRIX 500MG (Tissue) ×8 IMPLANT
NS IRRIG 1000ML POUR BTL (IV SOLUTION) ×12 IMPLANT
PACK CHEST (CUSTOM PROCEDURE TRAY) ×4 IMPLANT
PAD ARMBOARD 7.5X6 YLW CONV (MISCELLANEOUS) ×20 IMPLANT
SET HNDPC FAN SPRY TIP SCT (DISPOSABLE) ×1 IMPLANT
SOLUTION BETADINE 4OZ (MISCELLANEOUS) IMPLANT
SOLUTION PARTIC MCRMTRX 500MG (Tissue) ×2 IMPLANT
SPONGE LAP 18X18 X RAY DECT (DISPOSABLE) ×3 IMPLANT
SUT STEEL 6MS V (SUTURE) IMPLANT
SUT STEEL SZ 6 DBL 3X14 BALL (SUTURE) IMPLANT
SUT VIC AB 5-0 P-3 18XBRD (SUTURE) ×3 IMPLANT
SUT VIC AB 5-0 P3 18 (SUTURE) ×12
SYR 50ML SLIP (SYRINGE) IMPLANT
SYR 5ML LL (SYRINGE) IMPLANT
TOWEL OR 17X24 6PK STRL BLUE (TOWEL DISPOSABLE) ×8 IMPLANT
TOWEL OR 17X26 10 PK STRL BLUE (TOWEL DISPOSABLE) ×8 IMPLANT
TUBE ANAEROBIC SPECIMEN COL (MISCELLANEOUS) IMPLANT
WATER STERILE IRR 1000ML POUR (IV SOLUTION) ×4 IMPLANT

## 2012-09-20 NOTE — Anesthesia Postprocedure Evaluation (Signed)
  Anesthesia Post-op Note  Patient: [name] [name]  Procedure(s) Performed: Procedure(s) (LRB) with comments: [place] (N/A) - wound vac change  Patient Location: PACU  Anesthesia Type: General  Level of Consciousness: awake, alert  and oriented  Airway and Oxygen Therapy: Patient Spontanous Breathing and Patient connected to nasal cannula oxygen  Post-op Pain: none  Post-op Assessment: Post-op Vital signs reviewed, Patient's Cardiovascular Status Stable, Respiratory Function Stable, Patent Airway, No signs of Nausea or vomiting and Pain level controlled  Post-op Vital Signs: Reviewed and stable  Complications: No apparent anesthesia complications

## 2012-09-20 NOTE — Brief Op Note (Signed)
08/16/2012 - 09/20/2012  9:33 AM  PATIENT:  [name] [name]  61 y.o. female  PRE-OPERATIVE DIAGNOSIS:  Sternal wound osteomyelitis  POST-OPERATIVE DIAGNOSIS:  Sternal wound osteomyelitis  PROCEDURE:  Procedure(s) (LRB) with comments: STERNAL WOUND DEBRIDEMENT (N/A) - wound vac change, placement of Acell  SURGEON:  Surgeon(s) and Role:    * Ivin Poot, MD - Primary    * Claire Sanger, DO - Assisting  PHYSICIAN ASSISTANT: none  ASSISTANTS: none   ANESTHESIA:   general  EBL:  Total I/O In: 700 [I.V.:700] Out: -   BLOOD ADMINISTERED:none  DRAINS: none   LOCAL MEDICATIONS USED:  NONE  SPECIMEN:  Source of Specimen:  sternal bone  DISPOSITION OF SPECIMEN:  Microbiology  COUNTS:  YES  TOURNIQUET:  * No tourniquets in log *  DICTATION: dictated  PLAN OF CARE: return to room  PATIENT DISPOSITION:  PACU - hemodynamically stable.   Delay start of Pharmacological VTE agent (>24hrs) due to surgical blood loss or risk of bleeding: no

## 2012-09-20 NOTE — Progress Notes (Signed)
CARDIAC REHAB PHASE I   PRE:  Rate/Rhythm: 81SR  BP:  Supine:   Sitting: 130/70  Standing:    SaO2: 99%RA  MODE:  Ambulation: 300 ft   POST:  Rate/Rhythem: 98  BP:  Supine:   Sitting: 130/70  Standing:    SaO2: 98%RA 1450-1518 Pt walked 300 ft on RA with steady gait and asst x 1. Tolerated well. To bathroom and then recliner after walk. Family helped to encourage pt to walk. Call bell in reach.  Jeani Sow

## 2012-09-20 NOTE — Progress Notes (Signed)
The patient was examined and preop studies reviewed. There has been no change from the prior exam and the patient is ready for surgery.  Plan sternal debridement and vac change today

## 2012-09-20 NOTE — H&P (Signed)
[name] [name] is an 61 y.o. female.   Chief Complaint: Chest/sternal ulcer HPI: The patient is a 61 yrs old bf with a sternal ulcer.  She underwent CABG with use of the left IMA.  She developed an ulceration of the sternal area.  She was taken back to the OR for I & D with placement of ACell and the VAC.  It has granulation tissue on the edges but still has bone exposed.  She is being treated with IV antibiotics.  Past Medical History  Diagnosis Date  . Hypertension   . Diabetes mellitus     diagnosed in 2008  . Glaucoma   . Hyperlipidemia   . CVA (cerebral infarction)     right internal capsule stroke in 12/2006  . Left-sided sensory deficit present   . Cocaine abuse     crack cocaine heavily until 2008 then sporatic use since then  . Tobacco abuse   . Thyroid nodule     FNA in [id number] showed follicular cells but not definate neoplasm  . Coronary artery disease   . Myocardial infarction   . Anginal pain   . Stroke     Past Surgical History  Procedure Date  . Coronary artery bypass graft 07/09/2012    Procedure: CORONARY ARTERY BYPASS GRAFTING (CABG);  Surgeon: Ivin Poot, MD;  Location: [place];  Service: Open Heart Surgery;  Laterality: N/A;  . Cardiac catheterization   . Sternal wound debridement 08/17/2012    Procedure: STERNAL WOUND DEBRIDEMENT;  Surgeon: Ivin Poot, MD;  Location: [place];  Service: Thoracic;  Laterality: N/A;  wound vac application  . Sternal wound debridement 08/24/2012    Procedure: STERNAL WOUND DEBRIDEMENT;  Surgeon: Ivin Poot, MD;  Location: [place];  Service: Thoracic;  Laterality: N/A;  . Sternal wound debridement 09/01/2012    Procedure: STERNAL WOUND DEBRIDEMENT;  Surgeon: Ivin Poot, MD;  Location: [facility] OR;  Service: Thoracic;  Laterality: N/A;    Family History  Problem Relation Age of Onset  . Other      no known family CAD   Social History:  reports that she has been smoking.  She does not have any smokeless tobacco history on file.  She reports that she uses illicit drugs (Cocaine). She reports that she does not drink alcohol.  Allergies: No Known Allergies  Medications Prior to Admission  Medication Sig Dispense Refill  . aspirin 325 MG tablet Take 325 mg by mouth daily.      . brinzolamide (AZOPT) 1 % ophthalmic suspension Place 1 drop into both eyes 3 (three) times daily.      . cetirizine (ZYRTEC) 10 MG tablet Take 10 mg by mouth daily.      . Fluticasone-Salmeterol (ADVAIR) 250-50 MCG/DOSE AEPB Inhale 1 puff into the lungs every 12 (twelve) hours.      . furosemide (LASIX) 40 MG tablet Take 40 mg by mouth daily.      Marland Kitchen glimepiride (AMARYL) 4 MG tablet Take 4 mg by mouth daily before breakfast.      . latanoprost (XALATAN) 0.005 % ophthalmic solution Place 1 drop into both eyes at bedtime.      Marland Kitchen lisinopril (PRINIVIL,ZESTRIL) 5 MG tablet Take 5 mg by mouth daily.      . metFORMIN (GLUCOPHAGE) 500 MG tablet Take 1,000 mg by mouth 2 (two) times daily with a meal.      . pravastatin (PRAVACHOL) 40 MG tablet Take 40 mg by mouth at  bedtime.      Marland Kitchen DISCONTD: metoprolol tartrate (LOPRESSOR) 25 MG tablet Take 12.5 mg by mouth 2 (two) times daily.      Marland Kitchen DISCONTD: traMADol (ULTRAM) 50 MG tablet Take 50 mg by mouth 3 (three) times daily.        Results for orders placed during the hospital encounter of 08/16/12 (from the past 48 hour(s))  GLUCOSE, CAPILLARY     Status: Abnormal   Collection Time   09/18/12 11:15 AM      Component Value Range Comment   Glucose-Capillary 121 (*) 70 - 99 mg/dL    Comment 1 Notify RN     GLUCOSE, CAPILLARY     Status: Abnormal   Collection Time   09/18/12  4:26 PM      Component Value Range Comment   Glucose-Capillary 113 (*) 70 - 99 mg/dL    Comment 1 Notify RN     GLUCOSE, CAPILLARY     Status: Normal   Collection Time   09/18/12  9:41 PM      Component Value Range Comment   Glucose-Capillary 81  70 - 99 mg/dL   GLUCOSE, CAPILLARY     Status: Normal   Collection Time   09/19/12  6:17  AM      Component Value Range Comment   Glucose-Capillary 84  70 - 99 mg/dL   TYPE AND SCREEN     Status: Normal   Collection Time   09/19/12  8:45 AM      Component Value Range Comment   ABO/RH(D) O POS      Antibody Screen NEG      Sample Expiration 09/22/2012     GLUCOSE, CAPILLARY     Status: Abnormal   Collection Time   09/19/12 11:14 AM      Component Value Range Comment   Glucose-Capillary 107 (*) 70 - 99 mg/dL    Comment 1 Notify RN     GLUCOSE, CAPILLARY     Status: Abnormal   Collection Time   09/19/12  4:28 PM      Component Value Range Comment   Glucose-Capillary 136 (*) 70 - 99 mg/dL    Comment 1 Notify RN     GLUCOSE, CAPILLARY     Status: Normal   Collection Time   09/19/12  9:04 PM      Component Value Range Comment   Glucose-Capillary 91  70 - 99 mg/dL   GLUCOSE, CAPILLARY     Status: Abnormal   Collection Time   09/20/12  6:08 AM      Component Value Range Comment   Glucose-Capillary 111 (*) 70 - 99 mg/dL    No results found.  Review of Systems  Constitutional: Negative.   HENT: Negative.   Eyes: Negative.   Respiratory: Negative.   Cardiovascular: Negative.   Gastrointestinal: Negative.   Genitourinary: Negative.   Musculoskeletal: Negative.   Skin: Negative.   Psychiatric/Behavioral: Negative.     Blood pressure 113/75, pulse 69, temperature 98.7 F (37.1 C), temperature source Oral, resp. rate 18, height 5\' 9"  (1.753 m), weight 115.214 kg (254 lb), SpO2 97.00%. Physical Exam  Constitutional: She appears well-developed.  HENT:  Head: Normocephalic and atraumatic.  Right Ear: External ear normal.  Left Ear: External ear normal.  Eyes: EOM are normal. Pupils are equal, round, and reactive to light.  Cardiovascular: Normal rate.   Respiratory: Effort normal. No respiratory distress. She has no wheezes. She exhibits tenderness.    GI:  Soft. She exhibits no distension.  Musculoskeletal: Normal range of motion.  Skin: Skin is warm.    Psychiatric: She has a normal mood and affect. Her behavior is normal.     Assessment/Plan Chest/sternal ulceration - I & D of the ulcer with placement of Acell and the VAC to clear any nonviable bone.  Will likely need to undergo a muscle flap in the near future.  Risks and complications were discussed and include bleeding, pain, scar, risk of disruption of the sternum.  [place] 09/20/2012, 7:09 AM

## 2012-09-20 NOTE — Brief Op Note (Signed)
08/16/2012 - 09/20/2012  8:59 AM  PATIENT:  [name] [name]  61 y.o. female  PRE-OPERATIVE DIAGNOSIS:  Sternal wound osteomyelitis,diabetes, CAD  POST-OPERATIVE DIAGNOSIS: Same  PROCEDURE:  Procedure(s) (LRB) with comments: STERNAL WOUND DEBRIDEMENT (N/A) - wound vac change.aplication of ACELL membrane SURGEON:  Surgeon(s) and Role:    * Ivin Poot, MD - assist    * Lyndee Leo Sanger, DO - Primary  PHYSICIAN ASSISTANT: 0    ANESTHESIA:   general  EBL:  Total I/O In: 52 [I.V.:600] Out: - 5cc  BLOOD ADMINISTERED:0  DRAINS: VAC   LOCAL MEDICATIONS USED:0  SPECIMEN:  Tissue for C&S  DISPOSITION OF SPECIMEN:  Micro lab  COUNTS:  correct  TOURNIQUET: 0 DICTATION: .pend  PLAN OF CARE: Admit to inpatient   PATIENT DISPOSITION:  PACU - hemodynamically stable.   Delay start of Pharmacological VTE agent (>24hrs) due to surgical blood loss or risk of bleeding: yes

## 2012-09-20 NOTE — Anesthesia Preprocedure Evaluation (Addendum)
Anesthesia Evaluation  Patient identified by MRN, date of birth, ID band Patient awake    Reviewed: Allergy & Precautions, H&P , NPO status , Patient's Chart, lab work & pertinent test results, reviewed documented beta blocker date and time   History of Anesthesia Complications Negative for: history of anesthetic complications  Airway Mallampati: II TM Distance: >3 FB Neck ROM: Full    Dental  (+) Edentulous Lower and Edentulous Upper   Pulmonary former smoker,  breath sounds clear to auscultation  Pulmonary exam normal       Cardiovascular hypertension, Pt. on medications and Pt. on home beta blockers + angina + CAD, + Past MI and + CABG Rhythm:Regular Rate:Normal  EF 55%   Neuro/Psych CVA, Residual Symptoms negative psych ROS   GI/Hepatic Neg liver ROS, GERD-  Controlled,  Endo/Other  diabetes (glu 111), Well Controlled, Type 2, Oral Hypoglycemic AgentsMorbid obesity  Renal/GU Renal disease     Musculoskeletal   Abdominal (+) + obese,   Peds  Hematology   Anesthesia Other Findings   Reproductive/Obstetrics                          Anesthesia Physical Anesthesia Plan  ASA: III  Anesthesia Plan: General   Post-op Pain Management:    Induction: Intravenous  Airway Management Planned: Oral ETT  Additional Equipment:   Intra-op Plan:   Post-operative Plan: Extubation in OR  Informed Consent: I have reviewed the patients History and Physical, chart, labs and discussed the procedure including the risks, benefits and alternatives for the proposed anesthesia with the patient or authorized representative who has indicated his/her understanding and acceptance.   Dental advisory given  Plan Discussed with: Anesthesiologist, Surgeon and CRNA  Anesthesia Plan Comments: (Plan routine monitors, GETA)       Anesthesia Quick Evaluation

## 2012-09-20 NOTE — Transfer of Care (Signed)
Immediate Anesthesia Transfer of Care Note  Patient: [name] [name]  Procedure(s) Performed: Procedure(s) (LRB) with comments: [place] (N/A) - wound vac change  Patient Location: PACU  Anesthesia Type: General  Level of Consciousness: awake, oriented and patient cooperative  Airway & Oxygen Therapy: Patient Spontanous Breathing and Patient connected to nasal cannula oxygen  Post-op Assessment: Report given to PACU RN and Post -op Vital signs reviewed and stable  Post vital signs: Reviewed and stable  Complications: No apparent anesthesia complications

## 2012-09-20 NOTE — Preoperative (Signed)
Beta Blockers   Reason not to administer Beta Blockers:Not Applicable, took at [id number] yesterday

## 2012-09-20 NOTE — Progress Notes (Signed)
T. CTS progress note \ The sternal wound was carefully examined today during debridement. 80% is clean granulation tissue. There was some exposed sternal bone at the upper portion of the wound which was debrided with the rongeur back to healthy tissue. Bone cultures were obtained. Both A Cell powder and membrane were secured in the wound and a wound VAC placed.  Plan--75 mm pressure to wound VAC The wound VAC will not be changed until Thursday a.m. by physicians.  Cipro will be rotated, changed to IV Fortaz to cover Klebsiella previously cultured.

## 2012-09-21 ENCOUNTER — Encounter (HOSPITAL_COMMUNITY): Payer: Self-pay | Admitting: Cardiothoracic Surgery

## 2012-09-21 LAB — GLUCOSE, CAPILLARY
Glucose-Capillary: 155 mg/dL — ABNORMAL HIGH (ref 70–99)
Glucose-Capillary: 183 mg/dL — ABNORMAL HIGH (ref 70–99)
Glucose-Capillary: 99 mg/dL (ref 70–99)
Glucose-Capillary: 118 mg/dL — ABNORMAL HIGH (ref 70–99)

## 2012-09-21 NOTE — Progress Notes (Signed)
Nutrition Follow-up  Intervention:    [employer] 2 times daily with meals (250 kcals, 9 gm protein per 8 fl oz carton) RD to follow for nutrition care plan  Assessment:   Patient s/p sternal wound debridement, wound VAC change and placement of Acell 9/23. PO intake 75-100% per flowsheet records. Continues to receive [employer] supplements 2 times daily.  Diet Order:  Carbohydrate Modified Medium Calorie  Meds: Scheduled Meds:   . aspirin  325 mg Oral Daily  . cefTAZidime (FORTAZ)  IV  1 g Intravenous Q8H  . enoxaparin  40 mg Subcutaneous Q24H  . feeding supplement  1 Container Oral BID WC  . furosemide  40 mg Oral Daily  . glimepiride  4 mg Oral Q breakfast  . insulin aspart  0-5 Units Subcutaneous QHS  . insulin aspart  0-9 Units Subcutaneous TID WC  . insulin aspart  2 Units Subcutaneous TID WC  . metFORMIN  1,000 mg Oral BID  . metoprolol tartrate  25 mg Oral BID  . pantoprazole  40 mg Oral Q1200  . simvastatin  20 mg Oral q1800  . sodium chloride  10-40 mL Intracatheter Q12H  . DISCONTD: ciprofloxacin  400 mg Intravenous Q12H   Continuous Infusions:  PRN Meds:.sodium chloride, acetaminophen, lactulose, magnesium hydroxide, oxyCODONE, sodium chloride, traMADol, DISCONTD:  HYDROmorphone (DILAUDID) injection, DISCONTD: meperidine (DEMEROL) injection, DISCONTD: midazolam, DISCONTD: polymyxin / bacitracin (DOUBLE ANTIBIOTIC) irrigation, DISCONTD: promethazine, DISCONTD: sodium chloride irrigation  Labs:  CMP     Component Value Date/Time   NA 139 09/18/2012 0613   K 3.6 09/18/2012 0613   CL 101 09/18/2012 0613   CO2 28 09/18/2012 0613   GLUCOSE 91 09/18/2012 0613   BUN 16 09/18/2012 0613   CREATININE 1.15* 09/18/2012 0613   CALCIUM 9.4 09/18/2012 0613   PROT 6.8 09/18/2012 0613   ALBUMIN 2.9* 09/18/2012 0613   AST 21 09/18/2012 0613   ALT 19 09/18/2012 0613   ALKPHOS 90 09/18/2012 0613   BILITOT 0.2* 09/18/2012 0613   GFRNONAA 50* 09/18/2012 0613   GFRAA 58*  09/18/2012 0613     Intake/Output Summary (Last 24 hours) at 09/21/12 0855 Last data filed at 09/21/12 0800  Gross per 24 hour  Intake   1370 ml  Output      0 ml  Net   1370 ml    CBG (last 3)   Basename 09/21/12 0610 09/20/12 2111 09/20/12 1628  GLUCAP 155* 154* 188*    Weight Status:  116.6 kg (9/24) -- fluctuating   Re-estimated needs: 1800-2000 kcals, 110-120 gm protein   Nutrition Dx: Increased Nutrient Needs, ongoing   Goal: Oral intake with meals & supplements to meet >/= 90% of estimated nutrition needs, met   Monitor: PO & supplemental intake, weight, labs, I/O's   Phillips Odor, RD, LDN  Pager #: [phone]  After-Hours Pager #: [phone]

## 2012-09-21 NOTE — Op Note (Signed)
Operation-sternal debridement, placement of a cell membrane, wound VAC change  Surgeon= Tharon Aquas trigt M.D. 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  Procedure The patient is a 61 year old obese diabetic smoker with significant cocaine-drug abuse history was hospitalized with a sternal wound infection culture positive for Klebsiella and Morganella. She has received previous sternal wound debridements, IV antibiotics, and wound VAC therapy. 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 She was prepared for exploration and debridement today with the assistance of Dr. [name] for specific wound care strategies  The patient was brought to the operating room and placed supine on the operating room table. General anesthesia was induced. A proper timeout was performed. The previous wound VAC was removed. The chest was prepped and draped as a sterile field. The wound was examined. 80% was clean granulation tissue. At the upper left aspect there was exposed sternal bone which appeared not healthy. There is no gross appearance.  The wound was irrigated with the pulse lavage system using saline as well as antibiotic irrigation. The wound was sharply debrided. The exposed bone was debrided with Roger worse. This was taken back to healthy bleeding bone.  Next powder of a cell was proposed over the incision. Next a membrane of a cell was placed over the wound and secured with a running 5-0 Vicryl. Next a covering of Adaptic and sterile lubricating gel was placed over the membrane of a cell. Next a wound VAC sponge was cut to the appropriate size and shape and covered with the sterile Vi-Drape, and connected to the wound VAC pump  to suction -75 mmHg.  The patient was reversed from anesthesia extubated and returned recurrent. Blood loss was minimal.  The plan will be to leave the wound VAC in place until Thursday, September 26.

## 2012-09-21 NOTE — Progress Notes (Signed)
CARDIAC REHAB PHASE I   PRE:  Rate/Rhythm: 101 ST    BP:     SaO2:   MODE:  Ambulation: 300 ft   POST:  Rate/Rhythm: 93    BP: sitting 90/60     SaO2: 98 RA  At door ready to walk. Slow pace. Tired after walk. Positive attitude. Perla, ACSM

## 2012-09-21 NOTE — Op Note (Signed)
NAMELILLYROSE, [name] [name] NO.:  [id number]  MEDICAL RECORD NO.:  [id number]  LOCATION: [facility]                   FACILITY:[facility]  PHYSICIAN:  Theodoro Kos, DO      DATE OF BIRTH:  [birth year]  DATE OF PROCEDURE:09/20/12 DATE OF DISCHARGE:                              OPERATIVE REPORT   PREOPERATIVE DIAGNOSIS:  Sternal chest ulcer.  POSTOPERATIVE DIAGNOSIS:  Sternal chest ulcer.  PROCEDURE:  Irrigation and debridement of chest sternal ulcer for preparation of placement of ACell and the VAC.  Skin, subcutaneous tissue, muscle, and bone debrided.  SURGEONS:  Ivin Poot, MD  Assistant: Theodoro Kos, DO    ANESTHESIA:  General.  INDICATION FOR PROCEDURE: The patient is a 61 year old female who had an MI, requiring coronary artery bypass graft.  She then developed a sternal ulceration and underwent debridement with ACell and VAC placement, and she is being treated with antibiotics, but needed to return to the OR for more debridement.  Risks and complications were reviewed and included bleeding, pain, scar, risk of anesthesia.  DESCRIPTION OF PROCEDURE: The patient was taken to the operating room, placed on the operating room table in the supine position.  General anesthesia was administered. Once adequate, a time-out was called.  All information was confirmed to be correct.  She was prepped and draped in the usual sterile fashion.  A 10 blade was used to make an incision around the skin edge to be debrided back to healthier tissue.  The Pulsavac was used with 3 L of normal saline to irrigate the wound.  The rongeur was then used to debride the sternal bone that was on the superior portion of the wound. Portion of the bone was sent to microbiology for Gram stain culture and sensitivity.  The curette was used to debride the surrounding base of the wound.  She also had a wire that was exposed on the inferior portion and this was removed by Dr. Prescott Gum.  The scissors as well as 10 blade was used to debride any of the nonviable tissue.  Portion of the pectoralis and strap muscles were also debrided.  Hemostasis was then achieved with electrocautery.  The area was irrigated with antibiotic solution.  A 1000 g of ACell was placed in the wound and then a triple layer ACell sheet was placed on top after it was prepared according to the manufacturer guidelines.  Several pie crusting holes were placed for better drainage.  The sheet was then placed over the wound and tacked into place with 5-0 Vicryl. Adaptic was applied and then hydrogel and the VAC sponge.  The VAC was set at 75 mmHg pressure.  She tolerated the procedure well with no complications.  A breast binder was placed.  She was awoken and taken to the recovery room in stable condition.     Theodoro Kos, DO     CS/MEDQ  D:  09/20/2012  T:  09/21/2012  Job:  [id number]

## 2012-09-21 NOTE — Progress Notes (Addendum)
                   [address]            [place] [address]          [phone]     1 Day Post-Op  Procedure(s) (LRB): STERNAL WOUND DEBRIDEMENT (N/A) Subjective: Feels well  Objective  Telemetry sinus rhythm  Temp:  [98 F (36.7 C)-98.7 F (37.1 C)] 98.4 F (36.9 C) (09/24 0417) Pulse Rate:  [78-96] 83  (09/24 0417) Resp:  [17-19] 18  (09/24 0417) BP: (116-146)/(74-105) 116/74 mmHg (09/24 0417) SpO2:  [92 %-100 %] 95 % (09/24 0417) Weight:  [256 lb 1.6 oz (116.166 kg)] 256 lb 1.6 oz (116.166 kg) (09/24 0417)   Intake/Output Summary (Last 24 hours) at 09/21/12 0801 Last data filed at 09/21/12 0523  Gross per 24 hour  Intake   1570 ml  Output      0 ml  Net   1570 ml       General appearance: alert, cooperative and no distress Heart: regular rate and rhythm Lungs: dim in bases Wound: vac in place  Lab Results: No results found for this basename: NA:2,K:2,CL:2,CO2:2,GLUCOSE:2,BUN:2,CREATININE:2,CALCIUM:2,MG:2,PHOS:2 in the last 72 hours No results found for this basename: AST:2,ALT:2,ALKPHOS:2,BILITOT:2,PROT:2,ALBUMIN:2 in the last 72 hours No results found for this basename: LIPASE:2,AMYLASE:2 in the last 72 hours No results found for this basename: WBC:2,NEUTROABS:2,HGB:2,HCT:2,MCV:2,PLT:2 in the last 72 hours No results found for this basename: CKTOTAL:4,CKMB:4,TROPONINI:4 in the last 72 hours No components found with this basename: POCBNP:3 No results found for this basename: DDIMER in the last 72 hours No results found for this basename: HGBA1C in the last 72 hours No results found for this basename: CHOL,HDL,LDLCALC,TRIG,CHOLHDL in the last 72 hours No results found for this basename: TSH,T4TOTAL,FREET3,T3FREE,THYROIDAB in the last 72 hours No results found for this basename: VITAMINB12,FOLATE,FERRITIN,TIBC,IRON,RETICCTPCT in the last 72 hours  Medications: Scheduled    . aspirin  325 mg Oral Daily  . cefTAZidime (FORTAZ)  IV  1 g  Intravenous Q8H  . enoxaparin  40 mg Subcutaneous Q24H  . feeding supplement  1 Container Oral BID WC  . furosemide  40 mg Oral Daily  . glimepiride  4 mg Oral Q breakfast  . insulin aspart  0-5 Units Subcutaneous QHS  . insulin aspart  0-9 Units Subcutaneous TID WC  . insulin aspart  2 Units Subcutaneous TID WC  . metFORMIN  1,000 mg Oral BID  . metoprolol tartrate  25 mg Oral BID  . pantoprazole  40 mg Oral Q1200  . simvastatin  20 mg Oral q1800  . sodium chloride  10-40 mL Intracatheter Q12H  . DISCONTD: ciprofloxacin  400 mg Intravenous Q12H     Radiology/Studies:  No results found.  INR: Will add last result for INR, ABG once components are confirmed Will add last 4 CBG results once components are confirmed  Assessment/Plan: S/P Procedure(s) (LRB): STERNAL WOUND DEBRIDEMENT (N/A)  1. Doing well, cont fortaz 2 Vac dressing 3 pulm toilet     LOS: 36 days    [name],[name] 9/24/20138:01 AM    patient examined and medical record reviewed,agree with above note.Tissue culture from sternal bone pending. [name],Kriss Ishler 09/21/2012

## 2012-09-22 LAB — GLUCOSE, CAPILLARY
Glucose-Capillary: 185 mg/dL — ABNORMAL HIGH (ref 70–99)
Glucose-Capillary: 91 mg/dL (ref 70–99)
Glucose-Capillary: 61 mg/dL — ABNORMAL LOW (ref 70–99)
Glucose-Capillary: 56 mg/dL — ABNORMAL LOW (ref 70–99)

## 2012-09-22 NOTE — Progress Notes (Signed)
2 Days Post-Op Procedure(s) (LRB): STERNAL WOUND DEBRIDEMENT (N/A) Subjective: [name] [name] has no complaints this morning.  She is hoping to be discharged home soon.  Objective: Vital signs in last 24 hours: Temp:  [98.5 F (36.9 C)-99.1 F (37.3 C)] 99.1 F (37.3 C) (09/25 0500) Pulse Rate:  [75-89] 89  (09/25 0500) Cardiac Rhythm:  [-] Normal sinus rhythm (09/25 0500) Resp:  [16-18] 18  (09/25 0500) BP: (106-122)/(65-75) 122/70 mmHg (09/25 0500) SpO2:  [94 %-99 %] 94 % (09/25 0500)  Intake/Output from previous day: 09/24 0701 - 09/25 0700 In: 883 [P.O.:880; I.V.:3] Out: -   General appearance: alert, cooperative and no distress Neurologic: intact Heart: regular rate and rhythm Lungs: clear to auscultation bilaterally Abdomen: soft, non-tender; bowel sounds normal; no masses,  no organomegaly Wound: wound vac in place  Lab Results: No results found for this basename: WBC:2,HGB:2,HCT:2,PLT:2 in the last 72 hours BMET: No results found for this basename: NA:2,K:2,CL:2,CO2:2,GLUCOSE:2,BUN:2,CREATININE:2,CALCIUM:2 in the last 72 hours  PT/INR: No results found for this basename: LABPROT,INR in the last 72 hours ABG    Component Value Date/Time   PHART 7.460* 08/16/2012 1330   HCO3 26.8* 08/16/2012 1330   TCO2 27.9 08/16/2012 1330   ACIDBASEDEF 1.0 07/09/2012 2154   O2SAT 94.7 08/16/2012 1330   CBG (last 3)   Basename 09/22/12 0549 09/21/12 2008 09/21/12 1603  GLUCAP 185* 118* 99    Assessment/Plan: S/P Procedure(s) (LRB): STERNAL WOUND DEBRIDEMENT (N/A)  1. CV- NSR 2. Wound care- vac dressing changes 3. Continue Fortaz 4. Dispo- management per staff   LOS: 37 days    Ellwood Handler 09/22/2012

## 2012-09-22 NOTE — Progress Notes (Signed)
Chaplain visited patient as a follow up. Chaplain shared words of encouragement and comfort with patient. Chaplain also provided ministry of presence and prayed for patient. Patient expressed her joy for the Chaplain's visit. Chaplain will continue to provide spiritual care to patient as needed at a later time.

## 2012-09-22 NOTE — Progress Notes (Signed)
CARDIAC REHAB PHASE I   PRE:  Rate/Rhythm: 75SR  BP:  Supine:   Sitting:   Standing:    SaO2: 99%RA  MODE:  Ambulation: 350 ft   POST:  Rate/Rhythem: 75SR  BP:  Supine:   Sitting: 110/80  Standing:    SaO2: 96%RA 1410-1435 Helped pt with gown after bath. Ready for walk. Pt walked 350 ft on RA with steady gait. Tolerated well. To recliner after walk.  Jeani Sow

## 2012-09-23 LAB — TISSUE CULTURE
Culture: NO GROWTH
Gram Stain: NONE SEEN

## 2012-09-23 LAB — GLUCOSE, CAPILLARY
Glucose-Capillary: 220 mg/dL — ABNORMAL HIGH (ref 70–99)
Glucose-Capillary: 88 mg/dL (ref 70–99)
Glucose-Capillary: 111 mg/dL — ABNORMAL HIGH (ref 70–99)
Glucose-Capillary: 115 mg/dL — ABNORMAL HIGH (ref 70–99)
Glucose-Capillary: 147 mg/dL — ABNORMAL HIGH (ref 70–99)

## 2012-09-23 MED ORDER — OXYCODONE HCL 10 MG PO TABS: 10.0000 mg | ORAL_TABLET | ORAL | Status: AC | PRN

## 2012-09-23 MED ORDER — BOOST / RESOURCE BREEZE PO LIQD: 1.0000 | Freq: Two times a day (BID) | ORAL | Status: DC

## 2012-09-23 MED ORDER — INSULIN ASPART 100 UNIT/ML ~~LOC~~ SOLN: 0.0000 [IU] | Freq: Three times a day (TID) | SUBCUTANEOUS | Status: DC

## 2012-09-23 MED ORDER — DEXTROSE 5 % IV SOLN: 1.0000 g | Freq: Three times a day (TID) | INTRAVENOUS | Status: DC

## 2012-09-23 NOTE — Progress Notes (Signed)
3 Days Post-Op Procedure(s) (LRB): STERNAL WOUND DEBRIDEMENT (N/A) Subjective:  [name] [name] has no new complaints this morning.  She is upset about discharge plans to a SNF.  She would rather go home.   Objective: Vital signs in last 24 hours: Temp:  [98.8 F (37.1 C)-99.1 F (37.3 C)] 98.9 F (37.2 C) (09/26 0455) Pulse Rate:  [76-80] 80  (09/26 0455) Cardiac Rhythm:  [-] Normal sinus rhythm;Other (Comment) (09/25 2015) Resp:  [18-19] 19  (09/26 0455) BP: (132-135)/(72-85) 135/85 mmHg (09/26 0455) SpO2:  [98 %-100 %] 100 % (09/26 0455)  Intake/Output from previous day: 09/25 0701 - 09/26 0700 In: 2020 [P.O.:1920; IV Piggyback:100] Out: 1950 [Urine:1950]  General appearance: alert, cooperative and no distress Heart: regular rate and rhythm Lungs: clear to auscultation bilaterally Abdomen: soft, non-tender; bowel sounds normal; no masses,  no organomegaly Wound: wound vac in place3  Lab Results: No results found for this basename: WBC:2,HGB:2,HCT:2,PLT:2 in the last 72 hours BMET: No results found for this basename: NA:2,K:2,CL:2,CO2:2,GLUCOSE:2,BUN:2,CREATININE:2,CALCIUM:2 in the last 72 hours  PT/INR: No results found for this basename: LABPROT,INR in the last 72 hours ABG    Component Value Date/Time   PHART 7.460* 08/16/2012 1330   HCO3 26.8* 08/16/2012 1330   TCO2 27.9 08/16/2012 1330   ACIDBASEDEF 1.0 07/09/2012 2154   O2SAT 94.7 08/16/2012 1330   CBG (last 3)   Basename 09/23/12 0605 09/22/12 2116 09/22/12 1641  GLUCAP 88 220* 56*    Assessment/Plan: S/P Procedure(s) (LRB): STERNAL WOUND DEBRIDEMENT (N/A)  1. CV- NSR  2. Wound care- vac dressing changes 3. Continue Fortaz 4. Dispo- per patient Dr. Prescott Gum was in this morning and states the patient is ready to be discharged to SNF tomorrow.    LOS: 38 days    Ellwood Handler 09/23/2012

## 2012-09-23 NOTE — Progress Notes (Signed)
3 Days Post-Op  Subjective: The patient is feeling better and states that her chest does not hurt as much.  Objective: Vital signs in last 24 hours: Temp:  [98.8 F (37.1 C)-99.1 F (37.3 C)] 98.9 F (37.2 C) (09/26 0455) Pulse Rate:  [76-80] 80  (09/26 0455) Resp:  [18-19] 19  (09/26 0455) BP: (132-135)/(72-85) 135/85 mmHg (09/26 0455) SpO2:  [98 %-100 %] 100 % (09/26 0455) Last BM Date: 09/21/12  Intake/Output from previous day: 09/25 0701 - 09/26 0700 In: 2020 [P.O.:1920; IV Piggyback:100] Out: 1950 [Urine:1950] Intake/Output this shift:    General appearance: alert, cooperative and no distress Chest wall: no tenderness, VAC in place.  It was changed and there is some incorporation of the ACell.  No sign of purulence or malodor.  Lab Results:  No results found for this basename: WBC:2,HGB:2,HCT:2,PLT:2 in the last 72 hours BMET No results found for this basename: NA:2,K:2,CL:2,CO2:2,GLUCOSE:2,BUN:2,CREATININE:2,CALCIUM:2 in the last 72 hours PT/INR No results found for this basename: LABPROT:2,INR:2 in the last 72 hours ABG No results found for this basename: PHART:2,PCO2:2,PO2:2,HCO3:2 in the last 72 hours  Studies/Results: No results found.  Anti-infectives: Anti-infectives     Start     Dose/Rate Route Frequency Ordered Stop   09/20/12 1400   cefTAZidime (FORTAZ) 1 g in dextrose 5 % 50 mL IVPB        1 g 100 mL/hr over 30 Minutes Intravenous 3 times per day 09/20/12 1322     09/20/12 0828   polymyxin B 500,000 Units, bacitracin 50,000 Units in sodium chloride irrigation 0.9 % 500 mL irrigation  Status:  Discontinued          As needed 09/20/12 0828 09/20/12 0910   09/15/12 1818   cefUROXime (ZINACEF) 1.5 g in dextrose 5 % 50 mL IVPB        1.5 g 100 mL/hr over 30 Minutes Intravenous 60 min pre-op 09/15/12 1818 09/20/12 0753   09/13/12 2200   ciprofloxacin (CIPRO) IVPB 400 mg  Status:  Discontinued        400 mg 200 mL/hr over 60 Minutes Intravenous Every  12 hours 09/13/12 1744 09/20/12 1322   09/12/12 2000   ciprofloxacin (CIPRO) tablet 500 mg  Status:  Discontinued        500 mg Oral 2 times daily 09/12/12 1430 09/13/12 1744   08/24/12 1447   polymyxin B 500,000 Units, bacitracin 50,000 Units in sodium chloride irrigation 0.9 % 500 mL irrigation  Status:  Discontinued          As needed 08/24/12 1448 08/24/12 1509   08/24/12 1245   cefUROXime (ZINACEF) 1.5 g in dextrose 5 % 50 mL IVPB  Status:  Discontinued        1.5 g 100 mL/hr over 30 Minutes Intravenous 3 times per day 08/23/12 1425 08/24/12 1620   08/22/12 0000   ciprofloxacin (CIPRO) 400 MG/200ML SOLN        400 mg 200 mL/hr over 60 Minutes Intravenous Every 12 hours 08/22/12 1141     08/20/12 0830   ciprofloxacin (CIPRO) IVPB 400 mg  Status:  Discontinued        400 mg 200 mL/hr over 60 Minutes Intravenous Every 12 hours 08/20/12 0819 09/12/12 1430   08/17/12 0832   polymyxin B 500,000 Units, bacitracin 50,000 Units in sodium chloride irrigation 0.9 % 500 mL irrigation  Status:  Discontinued          As needed 08/17/12 [id number] 08/17/12 0848   08/16/12  1400   piperacillin-tazobactam (ZOSYN) IVPB 3.375 g  Status:  Discontinued        3.375 g 12.5 mL/hr over 240 Minutes Intravenous 3 times per day 08/16/12 1254 08/20/12 0819   08/16/12 1253   cefUROXime (ZINACEF) 1.5 g in dextrose 5 % 50 mL IVPB        1.5 g 100 mL/hr over 30 Minutes Intravenous 60 min pre-op 08/16/12 1254 08/17/12 0806   08/16/12 1253   vancomycin (VANCOCIN) IVPB 1000 mg/200 mL premix  Status:  Discontinued        1,000 mg 200 mL/hr over 60 Minutes Intravenous Every 12 hours 08/16/12 1254 08/20/12 0819          Assessment/Plan: s/p Procedure(s) (LRB) with comments: STERNAL WOUND DEBRIDEMENT (N/A) - wound vac change Plan for discharge tomorrow - Follow up at the wound care center. No VAC changes between no and then.  Keep VAC at 75 mmHg pressure continuous.  LOS: 38 days    [facility] 09/23/2012

## 2012-09-23 NOTE — Progress Notes (Signed)
CSW contacted [facility], SNF where pt was to d/c to re: wound vac.  SNF stated they would now not be able to take pt upon d/c due to her having no insurance.  SNF states that the LOGs are running out before Medicaid is being approved and SNF is losing money.  CSW will fax pt out to [facility] facilities once again to see if we can offer pt a bed at a different SNF.  CSW will continue to follow. Nonnie Done, [place] [phone]  Clinical Social Work

## 2012-09-23 NOTE — Progress Notes (Signed)
Pt declined. Sts she doesn't feel well mentally today. "Not going". Yves Dill CES, ACSM

## 2012-09-23 NOTE — Discharge Summary (Signed)
[address]            ,[place] [address]          [phone]      [name] [name] 01/21/1951 61 y.o. [id number]  08/16/2012   Ivin Poot, MD  Sternal infection sternal wound infection STERNAL INFECTION sternal wound Sternal would infection   History of present illness: The patient is a 61 year old obese poorly controlled diabetic female with a history of cocaine and tobacco abuse who recently underwent multivessel coronary artery bypass grafting for unstable angina and critical coronary anatomy. She initially did well but did present to the office approximately 10 days prior to this admission with a superficial sternal infection that was treated with wound packing as well as oral Keflex. She re\re presented on August 19 to the office where the wound appeared to be worse. She was felt to require admission for intravenous antibiotics as well as formal debridement of the wound and placement of a VAC wound care device. Past Medical History   Diagnosis  Date   .  Hypertension    .  Diabetes mellitus      diagnosed in 2008   .  Glaucoma    .  Hyperlipidemia    .  CVA (cerebral infarction)      right internal capsule stroke in 12/2006   .  Left-sided sensory deficit present    .  Cocaine abuse      crack cocaine heavily until 2008 then sporatic use since then   .  Tobacco abuse    .  Thyroid nodule      FNA in [id number] showed follicular cells but not definate neoplasm    Past Surgical History   Procedure  Date   .  Coronary artery bypass graft  07/09/2012     Procedure: CORONARY ARTERY BYPASS GRAFTING (CABG); Surgeon: Ivin Poot, MD; Location: [facility]; Service: Open Heart Surgery; Laterality: N/A;    History   Smoking status   .  Current Everyday Smoker -- 1.0 packs/day   Smokeless tobacco   .  Not on file    History   Alcohol Use  No    History    Social History   .  Marital Status:  Single     Spouse Name:  N/A     Number of  Children:  N/A   .  Years of Education:  N/A    Occupational History   .  Not on file.    Social History Main Topics   .  Smoking status:  Current Everyday Smoker -- 1.0 packs/day   .  Smokeless tobacco:  Not on file   .  Alcohol Use:  No   .  Drug Use:  Yes     Special:  Cocaine   .  Sexually Active:     Other Topics  Concern   .  Not on file    Social History Narrative   .  No narrative on file    No Known Allergies  Current Outpatient Prescriptions   Medication  Sig  Dispense  Refill   .  aspirin 325 MG tablet  Take 325 mg by mouth daily.     .  brinzolamide (AZOPT) 1 % ophthalmic suspension  Place 1 drop into both eyes 3 (three) times daily.     .  cetirizine (ZYRTEC)  10 MG tablet  Take 10 mg by mouth daily.     .  Fluticasone-Salmeterol (ADVAIR) 250-50 MCG/DOSE AEPB  Inhale 1 puff into the lungs 2 (two) times daily.  60 each  0   .  furosemide (LASIX) 40 MG tablet  Take 40 mg by mouth daily.     Marland Kitchen  glimepiride (AMARYL) 4 MG tablet  Take 1 tablet (4 mg total) by mouth daily with breakfast.  30 tablet  1   .  HYDROcodone-acetaminophen (NORCO/VICODIN) 5-325 MG per tablet  Take 2 tablets by mouth every 4 (four) hours as needed for pain.  10 tablet  0   .  latanoprost (XALATAN) 0.005 % ophthalmic solution  Place 1 drop into both eyes at bedtime.     Marland Kitchen  lisinopril (PRINIVIL,ZESTRIL) 5 MG tablet  Take 1 tablet (5 mg total) by mouth daily.  30 tablet  1   .  metFORMIN (GLUCOPHAGE) 500 MG tablet  Take 1,000 mg by mouth 2 (two) times daily with a meal.     .  metoprolol tartrate (LOPRESSOR) 25 MG tablet  Take 0.5 tablets (12.5 mg total) by mouth 2 (two) times daily.  30 tablet  1   .  potassium chloride SA (K-DUR,KLOR-CON) 20 MEQ tablet  Take 1 tablet (20 mEq total) by mouth daily. For 14 days then stop.  14 tablet  0   .  pravastatin (PRAVACHOL) 40 MG tablet  Take 40 mg by mouth at bedtime.     .  traMADol (ULTRAM) 50 MG tablet  Take 1 tablet (50 mg total) by mouth every 4 (four)  hours as needed for pain.  40 tablet  0   .  DISCONTD: furosemide (LASIX) 40 MG tablet  Take 1 tablet (40 mg total) by mouth daily. For 14 days then stop.  14 tablet  0     Hospital Course: The patient was taken to the operating room on 08/17/2012 where she underwent the following procedure:  OPERATIVE REPORT  OPERATION:  1. Superficial sternal wound debridement.  2. Placement of wound VAC.  PREOPERATIVE DIAGNOSIS: Superficial sternal wound infection, status  post urgent coronary artery bypass graft on July 09, 2012.  POSTOPERATIVE DIAGNOSIS: Superficial sternal wound infection, status  post urgent coronary artery bypass graft on July 09, 2012.  SURGEON: Ivin Poot, MD  ANESTHESIA: MAC with local 1% lidocaine.    Postoperative hospital course:  The patient's hospitalization postoperatively has been mostly unremarkable. She has returned to the operating room on other occasions for further wound management including debridement and replacement of the [facility] device. Additionally the use of a cell was incorporated into her management. The wound care nursing service has been involved in frequent dressing changes. She has been treated with intravenous antibiotics with initial cultures growing both Morganella morganii and Klebsiella pneumonia. Currently the patient is on [place]. It is felt at this time that she is stable for ongoing wound management in a skilled nursing facility setting.      No results found for this basename: NA:2,K:2,CL:2,CO2:2,GLUCOSE:2,BUN:2,CRATININE:2,CALCIUM:2 in the last 72 hours No results found for this basename: WBC:2,HGB:2,HCT:2,PLT:2 in the last 72 hours No results found for this basename: INR:2 in the last 72 hours   Discharge Instructions:  The patient is to be discharged to a skilled nursing facility for ongoing [facility] management and intravenous antibiotics.   Discharge Diagnosis:  Sternal infection sternal wound infection STERNAL INFECTION sternal  wound Sternal would infection  Secondary Diagnosis: Patient Active Problem  List  Diagnosis  . Glaucoma  . Hyperlipidemia  . CVA (cerebral infarction)  . NSTEMI (non-ST elevated myocardial infarction)  . Cocaine abuse  . Acute kidney injury  . Hypertension  . Diabetes mellitus type 2 with ketoacidosis  . RBC microcytosis   Past Medical History  Diagnosis Date  . Hypertension   . Diabetes mellitus     diagnosed in 2008  . Glaucoma   . Hyperlipidemia   . CVA (cerebral infarction)     right internal capsule stroke in 12/2006  . Left-sided sensory deficit present   . Cocaine abuse     crack cocaine heavily until 2008 then sporatic use since then  . Tobacco abuse   . Thyroid nodule     FNA in [id number] showed follicular cells but not definate neoplasm  . Coronary artery disease   . Myocardial infarction   . Anginal pain   . Stroke        [name] [name], [name] [name]  Home Medication Instructions [id number]   Printed on:09/23/12 [id number]  Medication Information                    brinzolamide (AZOPT) 1 % ophthalmic suspension Place 1 drop into both eyes 3 (three) times daily.           latanoprost (XALATAN) 0.005 % ophthalmic solution Place 1 drop into both eyes at bedtime.           cetirizine (ZYRTEC) 10 MG tablet Take 10 mg by mouth daily.           aspirin 325 MG tablet Take 325 mg by mouth daily.           lisinopril (PRINIVIL,ZESTRIL) 5 MG tablet Take 5 mg by mouth daily.           Fluticasone-Salmeterol (ADVAIR) 250-50 MCG/DOSE AEPB Inhale 1 puff into the lungs every 12 (twelve) hours.           metFORMIN (GLUCOPHAGE) 500 MG tablet Take 1,000 mg by mouth 2 (two) times daily with a meal.           pravastatin (PRAVACHOL) 40 MG tablet Take 40 mg by mouth at bedtime.           glimepiride (AMARYL) 4 MG tablet Take 4 mg by mouth daily before breakfast.           metoprolol tartrate (LOPRESSOR) 25 MG tablet Take 1 tablet (25 mg total) by mouth 2 (two) times daily.            dextrose 5 % SOLN 50 mL with cefTAZidime 1 G SOLR 1 g Inject 1 g into the vein every 8 (eight) hours.           feeding supplement (RESOURCE BREEZE) LIQD Take 1 Container by mouth 2 (two) times daily with a meal.           insulin aspart (NOVOLOG) 100 UNIT/ML injection Inject 0-9 Units into the skin 3 (three) times daily with meals.           Oxycodone HCl 10 MG TABS Take 1 tablet (10 mg total) by mouth every 4 (four) hours as needed.             Disposition: SNF   Patient's condition is Good  Jadene Pierini, PA-C 09/23/2012  9:27 AM

## 2012-09-23 NOTE — Progress Notes (Signed)
[facility] has offered a SNF bed to pt.  SNF evaluated pt at bedside.  SNF will contact RNCM re: size and specifications for [employer] wound vac.  SNF will be ready for pt tomorrow (Friday). Nonnie Done, [place] [phone]  Clinical Social Work

## 2012-09-24 LAB — GLUCOSE, CAPILLARY
Glucose-Capillary: 127 mg/dL — ABNORMAL HIGH (ref 70–99)
Glucose-Capillary: 154 mg/dL — ABNORMAL HIGH (ref 70–99)

## 2012-09-24 NOTE — Progress Notes (Signed)
CSW sent word for word MD instructions on wound care to SNF via [facility]. " Pt will be leaving [facility] with sponge and dressing in tact with tubing clamped.  SNF will be responsible for hooking this back up to suction (VAC) and previous settings and unclamp VAC tubing. Nonnie Done, [place] [phone]  Clinical Social Work

## 2012-09-24 NOTE — Progress Notes (Signed)
4 Days Post-Op Procedure(s) (LRB): STERNAL WOUND DEBRIDEMENT (N/A) Subjective:  [name] [name] has no complaints this morning.    Objective: Vital signs in last 24 hours: Temp:  [99 F (37.2 C)-99.1 F (37.3 C)] 99.1 F (37.3 C) (09/27 0429) Pulse Rate:  [74-80] 74  (09/27 0429) Cardiac Rhythm:  [-] Normal sinus rhythm;Other (Comment) (09/26 1950) Resp:  [18] 18  (09/27 0429) BP: (114-146)/(70-75) 142/72 mmHg (09/27 0429) SpO2:  [97 %-100 %] 97 % (09/27 0429)  Intake/Output from previous day: 09/26 0701 - 09/27 0700 In: 1440 [P.O.:1440] Out: 1200 [Urine:1200]  General appearance: alert, cooperative and no distress Neurologic: intact Heart: regular rate and rhythm Lungs: clear to auscultation bilaterally Abdomen: soft, non-tender; bowel sounds normal; no masses,  no organomegaly Wound: wound vac in place  Lab Results: No results found for this basename: WBC:2,HGB:2,HCT:2,PLT:2 in the last 72 hours BMET: No results found for this basename: NA:2,K:2,CL:2,CO2:2,GLUCOSE:2,BUN:2,CREATININE:2,CALCIUM:2 in the last 72 hours  PT/INR: No results found for this basename: LABPROT,INR in the last 72 hours ABG    Component Value Date/Time   PHART 7.460* 08/16/2012 1330   HCO3 26.8* 08/16/2012 1330   TCO2 27.9 08/16/2012 1330   ACIDBASEDEF 1.0 07/09/2012 2154   O2SAT 94.7 08/16/2012 1330   CBG (last 3)   Basename 09/24/12 0613 09/23/12 2154 09/23/12 1606  GLUCAP 127* 147* 115*    Assessment/Plan: S/P Procedure(s) (LRB): STERNAL WOUND DEBRIDEMENT (N/A)  1. CV- NSR 2. Wound Care- vac changes will be continued at discharge.  These will be done by Dr. [name] on Monday at the wound center 3. Probable Osteomyelitis- on IV Tressie Ellis, will continue at discharge, OR cultures from Monday are negative 4. Dispo- patient will be discharged to SNF today   LOS: 39 days    [name] [name], [name] [name] 09/24/2012

## 2012-09-24 NOTE — Progress Notes (Signed)
Came to say goodbye to pt and pt requested to walk! Ambulated 550 ft independently pushing IV pole. Very ecstatic about tx to SNF. Feels good. BP 130/66 after walk. Off monitor. Reminded to keep walking at rehab. Pt sts that she is still interested in [facility] and that they can call her in Nov/Dec.  [phone] [place], ACSM

## 2012-09-24 NOTE — Progress Notes (Signed)
CSW called [facility] for p/u.   Nonnie Done, [place] [phone]  Clinical Social Work

## 2012-09-24 NOTE — Progress Notes (Signed)
Report called to [facility].  Question regarding duration of fortaz.  Will page PA and call facility back with answer.

## 2012-09-24 NOTE — Progress Notes (Signed)
CSW called [facility] at [phone] and spoke with Santiago Glad, RN who stated they did not follow post-op and only managed chronic wounds, not acute.  CSW asked RN to assist CSW in addressing MD wishes re: d/c to SNF and being followed by [facility].  RN will call me back.  CSW will continue to update RNCM and unit RN in re: d/c.  Nonnie Done, [place] [phone]  Clinical Social Work

## 2012-09-24 NOTE — Progress Notes (Signed)
CSW received a call from SNF re: wound vac care upon d/c.  SNF stated that wound clinic MD could NOT come into the facility to care for the wound.  This would need to be arranged outside of the facility.  SNF stated that pt advised SNF that EMS would transport pt to MD office for this care.  CSW will f/u with RNCM and wound care clinic to verify this process prior to pt d/c. Nonnie Done, [place] [phone]  Clinical Social Work

## 2012-09-25 LAB — ANAEROBIC CULTURE: Gram Stain: NONE SEEN

## 2012-09-27 ENCOUNTER — Encounter (HOSPITAL_BASED_OUTPATIENT_CLINIC_OR_DEPARTMENT_OTHER): Payer: Medicaid Other | Attending: General Surgery

## 2012-09-27 DIAGNOSIS — L98499 Non-pressure chronic ulcer of skin of other sites with unspecified severity: Secondary | ICD-10-CM | POA: Insufficient documentation

## 2012-10-04 ENCOUNTER — Encounter (HOSPITAL_BASED_OUTPATIENT_CLINIC_OR_DEPARTMENT_OTHER): Payer: Medicaid Other | Attending: General Surgery

## 2012-10-04 DIAGNOSIS — L98499 Non-pressure chronic ulcer of skin of other sites with unspecified severity: Secondary | ICD-10-CM | POA: Insufficient documentation

## 2012-10-05 NOTE — Progress Notes (Signed)
[facility]  [name] [name], [name] [name]                 ACCOUNT NO.:  [id number]  MEDICAL RECORD NO.:  [id number]      DATE OF BIRTH:  06-Feb-1951  PHYSICIAN:  Theodoro Kos, DO       VISIT DATE:  10/04/2012                                  OFFICE VISIT   The patient is a 61 year old female who is here for followup on her chest ulcer.  She underwent irrigation and debridement of her chest ulcer after open heart surgery.  ACell was placed with the VAC.  She is doing really well and filling the area in very nicely.  There was no sign of infection.  No drainage.  No malodor.  We will continue with the ACell and the VAC and we will see her back in 1 week and we will put the VAC on 100 mmHg pressure.     Theodoro Kos, DO     CS/MEDQ  D:  10/04/2012  T:  10/04/2012  Job:  [id number]

## 2012-10-05 NOTE — Discharge Summary (Signed)
patient examined and medical record reviewed,agree with above note. [name],[name] [name] 10/05/2012

## 2012-10-12 ENCOUNTER — Other Ambulatory Visit: Payer: Self-pay | Admitting: Cardiothoracic Surgery

## 2012-10-12 DIAGNOSIS — I2510 Atherosclerotic heart disease of native coronary artery without angina pectoris: Secondary | ICD-10-CM

## 2012-10-13 ENCOUNTER — Ambulatory Visit
Admission: RE | Admit: 2012-10-13 | Discharge: 2012-10-13 | Disposition: A | Discharge status: Home / Self Care | Payer: Medicaid Other | Source: Ambulatory Visit | Attending: Cardiothoracic Surgery | Admitting: Cardiothoracic Surgery

## 2012-10-13 ENCOUNTER — Ambulatory Visit: Payer: Medicaid Other | Admitting: Cardiothoracic Surgery

## 2012-10-13 DIAGNOSIS — I2510 Atherosclerotic heart disease of native coronary artery without angina pectoris: Secondary | ICD-10-CM

## 2012-10-14 ENCOUNTER — Encounter: Payer: Self-pay | Admitting: Cardiothoracic Surgery

## 2012-10-14 ENCOUNTER — Ambulatory Visit (INDEPENDENT_AMBULATORY_CARE_PROVIDER_SITE_OTHER): Payer: Medicaid Other | Admitting: Cardiothoracic Surgery

## 2012-10-14 VITALS — BP 147/85 | HR 87 | Resp 18 | Ht 69.0 in | Wt 248.0 lb

## 2012-10-14 DIAGNOSIS — E119 Type 2 diabetes mellitus without complications: Secondary | ICD-10-CM

## 2012-10-14 DIAGNOSIS — T8140XA Infection following a procedure, unspecified, initial encounter: Secondary | ICD-10-CM

## 2012-10-14 DIAGNOSIS — Z09 Encounter for follow-up examination after completed treatment for conditions other than malignant neoplasm: Secondary | ICD-10-CM

## 2012-10-14 DIAGNOSIS — Z951 Presence of aortocoronary bypass graft: Secondary | ICD-10-CM

## 2012-10-14 DIAGNOSIS — I2510 Atherosclerotic heart disease of native coronary artery without angina pectoris: Secondary | ICD-10-CM

## 2012-10-14 NOTE — Progress Notes (Signed)
PCP is Dorna Mai, MD Referring Provider is Dorna Mai, MD  Chief Complaint  Patient presents with  . Routine Post Op    2 week f/u with CXR, S/P sternal wound debridement and placement of wound VAC. S/P CABG on 07/09/12    HPI: Patient returns for routine office followup following discharge the hospital for treatment of a superficial sternal wound infection with a gram-negative organism-[place]. She is now at [facility] receiving IV Rocephin and wound VAC therapy. She is followed at the wound clinic by Dr. [name] weekly and has a a cell membrane covering the sternal defect. She has not been septic she has not had fever and her diabetes is been fairly well controlled at the SNF.  Past Medical History  Diagnosis Date  . Hypertension   . Diabetes mellitus     diagnosed in 2008  . Glaucoma   . Hyperlipidemia   . CVA (cerebral infarction)     right internal capsule stroke in 12/2006  . Left-sided sensory deficit present   . Cocaine abuse     crack cocaine heavily until 2008 then sporatic use since then  . Tobacco abuse   . Thyroid nodule     FNA in [id number] showed follicular cells but not definate neoplasm  . Coronary artery disease   . Myocardial infarction   . Anginal pain   . Stroke     Past Surgical History  Procedure Date  . Coronary artery bypass graft 07/09/2012    Procedure: CORONARY ARTERY BYPASS GRAFTING (CABG);  Surgeon: Ivin Poot, MD;  Location: [facility];  Service: Open Heart Surgery;  Laterality: N/A;  . Cardiac catheterization   . Sternal wound debridement 08/17/2012    Procedure: STERNAL WOUND DEBRIDEMENT;  Surgeon: Ivin Poot, MD;  Location: [facility];  Service: Thoracic;  Laterality: N/A;  wound vac application  . Sternal wound debridement 08/24/2012    Procedure: STERNAL WOUND DEBRIDEMENT;  Surgeon: Ivin Poot, MD;  Location: [facility];  Service: Thoracic;  Laterality: N/A;  . Sternal wound debridement 09/01/2012    Procedure: STERNAL WOUND  DEBRIDEMENT;  Surgeon: Ivin Poot, MD;  Location: [facility];  Service: Thoracic;  Laterality: N/A;  . Sternal wound debridement 09/20/2012    Procedure: STERNAL WOUND DEBRIDEMENT;  Surgeon: Ivin Poot, MD;  Location: [facility] OR;  Service: Thoracic;  Laterality: N/A;  wound vac change    Family History  Problem Relation Age of Onset  . Other      no known family CAD    Social History History  Substance Use Topics  . Smoking status: Current Every Day Smoker -- 1.0 packs/day  . Smokeless tobacco: Not on file  . Alcohol Use: No    Current Outpatient Prescriptions  Medication Sig Dispense Refill  . aspirin 325 MG tablet Take 325 mg by mouth daily.      . brinzolamide (AZOPT) 1 % ophthalmic suspension Place 1 drop into both eyes 3 (three) times daily.      . cetirizine (ZYRTEC) 10 MG tablet Take 10 mg by mouth daily.      Marland Kitchen dextrose 5 % SOLN 50 mL with cefTAZidime 1 G SOLR 1 g Inject 1 g into the vein every 8 (eight) hours.      . Fluticasone-Salmeterol (ADVAIR) 250-50 MCG/DOSE AEPB Inhale 1 puff into the lungs every 12 (twelve) hours.      Marland Kitchen glimepiride (AMARYL) 4 MG tablet Take 4 mg by mouth daily before breakfast.      .  insulin aspart (NOVOLOG) 100 UNIT/ML injection Inject 0-9 Units into the skin 3 (three) times daily with meals.  1 vial    . latanoprost (XALATAN) 0.005 % ophthalmic solution Place 1 drop into both eyes at bedtime.      Marland Kitchen lisinopril (PRINIVIL,ZESTRIL) 5 MG tablet Take 5 mg by mouth daily.      . metFORMIN (GLUCOPHAGE) 500 MG tablet Take 1,000 mg by mouth 2 (two) times daily with a meal.      . metoprolol tartrate (LOPRESSOR) 25 MG tablet Take 1 tablet (25 mg total) by mouth 2 (two) times daily.      . pravastatin (PRAVACHOL) 40 MG tablet Take 40 mg by mouth at bedtime.       Current Facility-Administered Medications  Medication Dose Route Frequency Provider Last Rate Last Dose  . loratadine (CLARITIN) tablet 10 mg  10 mg Oral Daily Ivin Poot, MD        No  Known Allergies  Review of Systems no fever no drainage no edema  BP 147/85  Pulse 87  Resp 18  Ht 5\' 9"  (1.753 m)  Wt 248 lb (112.492 kg)  BMI 36.62 kg/m2  SpO2 94% Physical Exam Alert and comfortable Breath sounds clear Cardiac rhythm regular murmur or gallop Sternal defect exam and very clean, superficial and granulating and, a cell membrane intact, new wound VAC applied with Adaptic covering  Diagnostic Tests: Chest x-ray clear  Impression: Doing well with therapy for superficial sternal infection with gram-negative organism She should be ready to be discharged to home on October 20 after her last dose of IV antibiotic. She will continue to have weekly visits to the wound clinic to see Dr. [name] Plan: Return for followup. 3 weeks

## 2012-10-14 NOTE — Patient Instructions (Signed)
Do not smoke or use drugs Walk every day at home He may return home from the skilled nursing facility on October 20 Where your bra and binder everyday

## 2012-10-18 NOTE — Progress Notes (Signed)
[facility]  NAMECHANI, KARNATZ                 ACCOUNT NO.:  [id number]  MEDICAL RECORD NO.:  [id number]      DATE OF BIRTH:  1951/12/02  PHYSICIAN:  Theodoro Kos, DO            VISIT DATE:                                  OFFICE VISIT   [name] is a 61 year old female who is here for followup on her chest ulcer.  She underwent irrigation and debridement in the OR with ACell and VAC placement.  She has done extremely well and is granulating the lower portion of the ulcer very well.  The upper portion still has ways to go, but the bone is getting covered with granulation tissue.  There is no sign of infection.  I recommend her continuing with the VAC, either a binder or a sports bra and follow up in a week.     Theodoro Kos, DO     CS/MEDQ  D:  10/18/2012  T:  10/18/2012  Job:  [id number]

## 2012-10-20 ENCOUNTER — Encounter (HOSPITAL_COMMUNITY): Payer: Self-pay | Admitting: Physical Medicine and Rehabilitation

## 2012-10-20 ENCOUNTER — Emergency Department (HOSPITAL_COMMUNITY)
Admission: EM | Admit: 2012-10-20 | Discharge: 2012-10-20 | Disposition: A | Discharge status: Home / Self Care | Payer: Medicaid Other | Attending: Emergency Medicine | Admitting: Emergency Medicine

## 2012-10-20 DIAGNOSIS — Z951 Presence of aortocoronary bypass graft: Secondary | ICD-10-CM | POA: Insufficient documentation

## 2012-10-20 DIAGNOSIS — H409 Unspecified glaucoma: Secondary | ICD-10-CM | POA: Insufficient documentation

## 2012-10-20 DIAGNOSIS — M6281 Muscle weakness (generalized): Secondary | ICD-10-CM | POA: Insufficient documentation

## 2012-10-20 DIAGNOSIS — I252 Old myocardial infarction: Secondary | ICD-10-CM | POA: Insufficient documentation

## 2012-10-20 DIAGNOSIS — I69998 Other sequelae following unspecified cerebrovascular disease: Secondary | ICD-10-CM | POA: Insufficient documentation

## 2012-10-20 DIAGNOSIS — E782 Mixed hyperlipidemia: Secondary | ICD-10-CM | POA: Insufficient documentation

## 2012-10-20 DIAGNOSIS — Z79899 Other long term (current) drug therapy: Secondary | ICD-10-CM | POA: Insufficient documentation

## 2012-10-20 DIAGNOSIS — I2510 Atherosclerotic heart disease of native coronary artery without angina pectoris: Secondary | ICD-10-CM | POA: Insufficient documentation

## 2012-10-20 DIAGNOSIS — R29898 Other symptoms and signs involving the musculoskeletal system: Secondary | ICD-10-CM

## 2012-10-20 DIAGNOSIS — F1410 Cocaine abuse, uncomplicated: Secondary | ICD-10-CM | POA: Insufficient documentation

## 2012-10-20 DIAGNOSIS — E041 Nontoxic single thyroid nodule: Secondary | ICD-10-CM | POA: Insufficient documentation

## 2012-10-20 DIAGNOSIS — Z7982 Long term (current) use of aspirin: Secondary | ICD-10-CM | POA: Insufficient documentation

## 2012-10-20 DIAGNOSIS — Z8679 Personal history of other diseases of the circulatory system: Secondary | ICD-10-CM | POA: Insufficient documentation

## 2012-10-20 DIAGNOSIS — Z794 Long term (current) use of insulin: Secondary | ICD-10-CM | POA: Insufficient documentation

## 2012-10-20 DIAGNOSIS — R414 Neurologic neglect syndrome: Secondary | ICD-10-CM | POA: Insufficient documentation

## 2012-10-20 DIAGNOSIS — F17200 Nicotine dependence, unspecified, uncomplicated: Secondary | ICD-10-CM | POA: Insufficient documentation

## 2012-10-20 DIAGNOSIS — I10 Essential (primary) hypertension: Secondary | ICD-10-CM | POA: Insufficient documentation

## 2012-10-20 DIAGNOSIS — E119 Type 2 diabetes mellitus without complications: Secondary | ICD-10-CM | POA: Insufficient documentation

## 2012-10-20 LAB — URINALYSIS, ROUTINE W REFLEX MICROSCOPIC
Bilirubin Urine: NEGATIVE
Ketones, ur: NEGATIVE mg/dL
Nitrite: NEGATIVE
Protein, ur: 100 mg/dL — AB
Urobilinogen, UA: 0.2 mg/dL (ref 0.0–1.0)
pH: 5 (ref 5.0–8.0)

## 2012-10-20 LAB — CBC WITH DIFFERENTIAL/PLATELET
Eosinophils Absolute: 0 10*3/uL (ref 0.0–0.7)
Eosinophils Relative: 0 % (ref 0–5)
Hemoglobin: 11 g/dL — ABNORMAL LOW (ref 12.0–15.0)
Lymphocytes Relative: 21 % (ref 12–46)
Lymphs Abs: 1.8 10*3/uL (ref 0.7–4.0)
MCH: 24.6 pg — ABNORMAL LOW (ref 26.0–34.0)
MCV: 76.3 fL — ABNORMAL LOW (ref 78.0–100.0)
Monocytes Relative: 9 % (ref 3–12)
RBC: 4.47 MIL/uL (ref 3.87–5.11)
WBC: 8.9 10*3/uL (ref 4.0–10.5)

## 2012-10-20 LAB — COMPREHENSIVE METABOLIC PANEL
ALT: 8 U/L (ref 0–35)
Alkaline Phosphatase: 93 U/L (ref 39–117)
BUN: 12 mg/dL (ref 6–23)
CO2: 27 mEq/L (ref 19–32)
Calcium: 9.5 mg/dL (ref 8.4–10.5)
GFR calc Af Amer: 73 mL/min — ABNORMAL LOW (ref >90)
GFR calc non Af Amer: 63 mL/min — ABNORMAL LOW (ref >90)
Glucose, Bld: 164 mg/dL — ABNORMAL HIGH (ref 70–99)
Potassium: 3.7 mEq/L (ref 3.5–5.1)
Total Protein: 7.7 g/dL (ref 6.0–8.3)

## 2012-10-20 LAB — URINE MICROSCOPIC-ADD ON

## 2012-10-20 NOTE — ED Notes (Signed)
Patient currently watching television in bed; no respiratory or acute distress noted.  Patient informed that if she is unable to give [place] a urine sample, we will have to use an in-and-out cath to obtain urine (per PA order).  Patient verbalized understanding; attempting to use bedpan at this time.  Will continue to monitor.

## 2012-10-20 NOTE — ED Notes (Signed)
Patient currently resting quietly in bed; no respiratory or acute distress noted.  Patient updated on plan of care; patient informed that we need a urine sample.  Patient reports that she does not need to urinate at this time; will continue to monitor.

## 2012-10-20 NOTE — ED Notes (Signed)
Pt presents to department for evaluation of bilateral leg pain. States she was getting out of car last night when she fell to ground. Now states bilateral leg pain. Denies SOB. Denies chest pain. Able to move all extremities. No obvious injuries noted. Wound vacuum present to center of chest. Pt is conscious alert and oriented x4. No signs of acute distress noted.

## 2012-10-20 NOTE — ED Provider Notes (Signed)
History     CSN: [id number]  Arrival date & time 10/20/12  24   First MD Initiated Contact with Patient 10/20/12 1816      Chief Complaint  Patient presents with  . Leg Pain    (Consider location/radiation/quality/duration/timing/severity/associated sxs/prior treatment) HPI Comments: [name] [name] is a 61 y.o. Female who presents with complaint of legs giving out yesterday. Pt states she was getting out of a car yesterday, when both of her legs "just gave out." Pt states she fell to the ground, however denies getting injured. States did not hit head or had LOC. Pt states she was able to ambulated after that with  No leg giving out. States having intermittent pain in left thigh. No swelling or pain anywhere else. Pt denies chest pain, SOB, weakness. Denies fever, chills, malaise. States here just to make sure everything is ok.    Past Medical History  Diagnosis Date  . Hypertension   . Diabetes mellitus     diagnosed in 2008  . Glaucoma   . Hyperlipidemia   . CVA (cerebral infarction)     right internal capsule stroke in 12/2006  . Left-sided sensory deficit present   . Cocaine abuse     crack cocaine heavily until 2008 then sporatic use since then  . Tobacco abuse   . Thyroid nodule     FNA in [id number] showed follicular cells but not definate neoplasm  . Coronary artery disease   . Myocardial infarction   . Anginal pain   . Stroke     Past Surgical History  Procedure Date  . Coronary artery bypass graft 07/09/2012    Procedure: CORONARY ARTERY BYPASS GRAFTING (CABG);  Surgeon: Ivin Poot, MD;  Location: [facility];  Service: Open Heart Surgery;  Laterality: N/A;  . Cardiac catheterization   . Sternal wound debridement 08/17/2012    Procedure: STERNAL WOUND DEBRIDEMENT;  Surgeon: Ivin Poot, MD;  Location: [place];  Service: Thoracic;  Laterality: N/A;  wound vac application  . Sternal wound debridement 08/24/2012    Procedure: STERNAL WOUND DEBRIDEMENT;  Surgeon: Ivin Poot, MD;  Location: [place];  Service: Thoracic;  Laterality: N/A;  . Sternal wound debridement 09/01/2012    Procedure: STERNAL WOUND DEBRIDEMENT;  Surgeon: Ivin Poot, MD;  Location: [place];  Service: Thoracic;  Laterality: N/A;  . Sternal wound debridement 09/20/2012    Procedure: STERNAL WOUND DEBRIDEMENT;  Surgeon: Ivin Poot, MD;  Location: [facility];  Service: Thoracic;  Laterality: N/A;  wound vac change    Family History  Problem Relation Age of Onset  . Other      no known family CAD    History  Substance Use Topics  . Smoking status: Current Every Day Smoker -- 1.0 packs/day  . Smokeless tobacco: Not on file  . Alcohol Use: No    OB History    Grav Para Term Preterm Abortions TAB SAB Ect Mult Living                  Review of Systems  Constitutional: Negative for fever and chills.  HENT: Negative for neck pain and neck stiffness.   Eyes: Negative for visual disturbance.  Respiratory: Negative.   Cardiovascular: Negative.   Gastrointestinal: Negative for nausea, vomiting and abdominal pain.  Genitourinary: Negative for dysuria.  Musculoskeletal: Positive for gait problem.  Skin: Negative.   Neurological: Negative for dizziness, weakness, numbness and headaches.    Allergies  Review  of patient's allergies indicates no known allergies.  Home Medications   Current Outpatient Rx  Name Route Sig Dispense Refill  . ASPIRIN 325 MG PO TABS Oral Take 325 mg by mouth daily.    Marland Kitchen BRINZOLAMIDE 1 % OP SUSP Both Eyes Place 1 drop into both eyes 3 (three) times daily.    Marland Kitchen CETIRIZINE HCL 10 MG PO TABS Oral Take 10 mg by mouth daily.    Marland Kitchen FLUTICASONE-SALMETEROL 250-50 MCG/DOSE IN AEPB Inhalation Inhale 1 puff into the lungs every 12 (twelve) hours.    Marland Kitchen GLIMEPIRIDE 4 MG PO TABS Oral Take 4 mg by mouth daily before breakfast.    . INSULIN ASPART 100 UNIT/ML [place] SOLN Subcutaneous Inject 0-9 Units into the skin 3 (three) times daily with meals. 1 vial   . LATANOPROST  0.005 % OP SOLN Both Eyes Place 1 drop into both eyes at bedtime.    Marland Kitchen LISINOPRIL 5 MG PO TABS Oral Take 5 mg by mouth daily.    Marland Kitchen METFORMIN HCL 500 MG PO TABS Oral Take 1,000 mg by mouth 2 (two) times daily with a meal.    . METOPROLOL TARTRATE 25 MG PO TABS Oral Take 1 tablet (25 mg total) by mouth 2 (two) times daily.    Marland Kitchen PRAVASTATIN SODIUM 40 MG PO TABS Oral Take 40 mg by mouth at bedtime.    . CEFTAZIDIME 1 G/50 ML IVPB MIXTURE Intravenous Inject 1 g into the vein every 8 (eight) hours.      BP 129/82  Pulse 116  Temp 99.1 F (37.3 C) (Oral)  Resp 18  SpO2 96%  Physical Exam  Nursing note and vitals reviewed. Constitutional: She is oriented to person, place, and time. She appears well-developed and well-nourished. No distress.  Eyes: Conjunctivae normal are normal.  Neck: Neck supple.  Cardiovascular: Normal rate, regular rhythm and normal heart sounds.   Pulmonary/Chest: Effort normal and breath sounds normal. No respiratory distress. She has no wheezes. She has no rales.       Wound vac to the chest  Abdominal: Soft. Bowel sounds are normal. She exhibits no distension. There is no tenderness. There is no rebound.  Musculoskeletal: She exhibits no edema and no tenderness.       Full ROM of bilateral LE at knee, hip, and ankle joints. No swelling or bruising noted.   Neurological: She is alert and oriented to person, place, and time.       5/5 and equal upper and lower extremity strength bilaterally. Equal grip strength bilaterally. Normal finger to nose and heel to shin. No pronator drift. Patellar reflexes 2+   Skin: Skin is warm and dry.  Psychiatric: She has a normal mood and affect.    ED Course  Procedures (including critical care time)  Pt with episode of leg giving out after stepping out of the car yesterday. Pt with no symptoms since then, has been ambulatory and was able to ambulate here in ED. Will get labs, ua  Results for orders placed during the hospital  encounter of 10/20/12  CBC WITH DIFFERENTIAL      Component Value Range   WBC 8.9  4.0 - 10.5 K/uL   RBC 4.47  3.87 - 5.11 MIL/uL   Hemoglobin 11.0 (*) 12.0 - 15.0 g/dL   HCT 34.1 (*) 36.0 - 46.0 %   MCV 76.3 (*) 78.0 - 100.0 fL   MCH 24.6 (*) 26.0 - 34.0 pg   MCHC 32.3  30.0 -  36.0 g/dL   RDW 17.9 (*) 11.5 - 15.5 %   Platelets 360  150 - 400 K/uL   Neutrophils Relative 70  43 - 77 %   Neutro Abs 6.2  1.7 - 7.7 K/uL   Lymphocytes Relative 21  12 - 46 %   Lymphs Abs 1.8  0.7 - 4.0 K/uL   Monocytes Relative 9  3 - 12 %   Monocytes Absolute 0.8  0.1 - 1.0 K/uL   Eosinophils Relative 0  0 - 5 %   Eosinophils Absolute 0.0  0.0 - 0.7 K/uL   Basophils Relative 0  0 - 1 %   Basophils Absolute 0.0  0.0 - 0.1 K/uL  COMPREHENSIVE METABOLIC PANEL      Component Value Range   Sodium 143  135 - 145 mEq/L   Potassium 3.7  3.5 - 5.1 mEq/L   Chloride 105  96 - 112 mEq/L   CO2 27  19 - 32 mEq/L   Glucose, Bld 164 (*) 70 - 99 mg/dL   BUN 12  6 - 23 mg/dL   Creatinine, Ser 0.95  0.50 - 1.10 mg/dL   Calcium 9.5  8.4 - 10.5 mg/dL   Total Protein 7.7  6.0 - 8.3 g/dL   Albumin 3.4 (*) 3.5 - 5.2 g/dL   AST 15  0 - 37 U/L   ALT 8  0 - 35 U/L   Alkaline Phosphatase 93  39 - 117 U/L   Total Bilirubin 0.3  0.3 - 1.2 mg/dL   GFR calc non Af Amer 63 (*) >90 mL/min   GFR calc Af Amer 73 (*) >90 mL/min  URINALYSIS, ROUTINE W REFLEX MICROSCOPIC      Component Value Range   Color, Urine YELLOW  YELLOW   APPearance CLOUDY (*) CLEAR   Specific Gravity, Urine 1.022  1.005 - 1.030   pH 5.0  5.0 - 8.0   Glucose, UA NEGATIVE  NEGATIVE mg/dL   Hgb urine dipstick NEGATIVE  NEGATIVE   Bilirubin Urine NEGATIVE  NEGATIVE   Ketones, ur NEGATIVE  NEGATIVE mg/dL   Protein, ur 100 (*) NEGATIVE mg/dL   Urobilinogen, UA 0.2  0.0 - 1.0 mg/dL   Nitrite NEGATIVE  NEGATIVE   Leukocytes, UA SMALL (*) NEGATIVE  URINE MICROSCOPIC-ADD ON      Component Value Range   Squamous Epithelial / LPF FEW (*) RARE   WBC, UA 3-6   <3 WBC/hpf   RBC / HPF 3-6  <3 RBC/hpf   Bacteria, UA RARE  RARE   Casts HYALINE CASTS (*) NEGATIVE   Crystals URIC ACID CRYSTALS (*) NEGATIVE   Urine-Other MUCOUS PRESENT     10:59 PM Pt's labs and UA are unremarkable. She is hypertensive, but has not taken her medications tonight yet. She is ambulatory. Normal neurological exam. No signs of a stroke or injury to legs. Pt is stable for d/c home with close follow up. Instructed to return if worsening.    1. Leg weakness       MDM  Pt with episode of leg "giving out" as she was stepping out of the car yesterday. No hx of the same. No injuries. Labs and UA unremarkable. Neuro exam normal. No evidence of stroke. Pt stable for d/c home.         Renold Genta, [place] 10/20/12 [phone]

## 2012-10-20 NOTE — ED Notes (Signed)
Pt ambulated to the bathroom, unsuccessful attempt to catch a urine spec. Pt states they will try again later

## 2012-10-20 NOTE — ED Notes (Signed)
Nurse tech at bedside assisting with ambulation.

## 2012-10-20 NOTE — ED Notes (Signed)
Phlebotomy at bedside.

## 2012-10-22 LAB — URINE CULTURE: Colony Count: 50000

## 2012-10-25 ENCOUNTER — Encounter (HOSPITAL_BASED_OUTPATIENT_CLINIC_OR_DEPARTMENT_OTHER): Payer: Medicaid Other

## 2012-10-26 NOTE — Progress Notes (Signed)
[facility]  NAME:  [name] [name], [name] [name]                      ACCOUNT NO.:  MEDICAL RECORD NO.:  [id number]      DATE OF BIRTH:  Apr 13, 1951  PHYSICIAN:  Theodoro Kos, DO       VISIT DATE:  10/25/2012                                  OFFICE VISIT   The patient is a 61 year old female who is here for followup on her chest ulcer.  She is still at the facility, but should be going home tomorrow.  She has been using the [place] still in place.  She is doing remarkably well.  The ulceration is getting smaller, less deep, and granulating in.  The bone is getting covered as well.  We will continue with ACell and VAC, and have her follow up in a week.     Theodoro Kos, DO     CS/MEDQ  D:  10/25/2012  T:  10/26/2012  Job:  [id number]

## 2012-10-26 NOTE — ED Provider Notes (Signed)
Medical screening examination/treatment/procedure(s) were performed by non-physician practitioner and as supervising physician I was immediately available for consultation/collaboration.  Virgel Manifold, MD 10/26/12 [phone]

## 2012-10-29 ENCOUNTER — Other Ambulatory Visit: Payer: Self-pay | Admitting: Cardiothoracic Surgery

## 2012-10-29 DIAGNOSIS — Z951 Presence of aortocoronary bypass graft: Secondary | ICD-10-CM

## 2012-11-01 ENCOUNTER — Encounter (HOSPITAL_BASED_OUTPATIENT_CLINIC_OR_DEPARTMENT_OTHER): Payer: Medicaid Other | Attending: Plastic Surgery

## 2012-11-01 DIAGNOSIS — L98499 Non-pressure chronic ulcer of skin of other sites with unspecified severity: Secondary | ICD-10-CM | POA: Insufficient documentation

## 2012-11-03 ENCOUNTER — Ambulatory Visit: Payer: Medicaid Other | Admitting: Cardiothoracic Surgery

## 2012-11-08 ENCOUNTER — Ambulatory Visit (INDEPENDENT_AMBULATORY_CARE_PROVIDER_SITE_OTHER): Payer: Medicaid Other | Admitting: Physician Assistant

## 2012-11-08 VITALS — BP 150/96 | HR 104 | Resp 20 | Ht 69.0 in | Wt 248.0 lb

## 2012-11-08 DIAGNOSIS — I2510 Atherosclerotic heart disease of native coronary artery without angina pectoris: Secondary | ICD-10-CM

## 2012-11-08 DIAGNOSIS — Z09 Encounter for follow-up examination after completed treatment for conditions other than malignant neoplasm: Secondary | ICD-10-CM

## 2012-11-08 DIAGNOSIS — T8140XA Infection following a procedure, unspecified, initial encounter: Secondary | ICD-10-CM

## 2012-11-08 DIAGNOSIS — Z951 Presence of aortocoronary bypass graft: Secondary | ICD-10-CM

## 2012-11-08 MED ORDER — OXYCODONE-ACETAMINOPHEN 5-325 MG PO TABS: 1.0000 | ORAL_TABLET | Freq: Four times a day (QID) | ORAL | Status: DC | PRN

## 2012-11-08 NOTE — Progress Notes (Signed)
  HPI:  [name] [name] presents today for wound follow up.  She is doing well.  She has been home for approximately 2 weeks from the rehab center.  She continues to require wound vac therapy, but states there is only a small area left.  She states she was told she would probably require her wound vac for approximately 6 more weeks.  She denies fevers, chills, sweats, and purulent drainage.  Finally she is requesting a refill on pain medication stating she still has some pain with wound vac changes and at night when she sleeps.  Current Outpatient Prescriptions  Medication Sig Dispense Refill  . aspirin 325 MG tablet Take 325 mg by mouth daily.      . brinzolamide (AZOPT) 1 % ophthalmic suspension Place 1 drop into both eyes 3 (three) times daily.      . cetirizine (ZYRTEC) 10 MG tablet Take 10 mg by mouth daily.      Marland Kitchen dextrose 5 % SOLN 50 mL with cefTAZidime 1 G SOLR 1 g Inject 1 g into the vein every 8 (eight) hours.      . Fluticasone-Salmeterol (ADVAIR) 250-50 MCG/DOSE AEPB Inhale 1 puff into the lungs every 12 (twelve) hours.      Marland Kitchen glimepiride (AMARYL) 4 MG tablet Take 4 mg by mouth daily before breakfast.      . insulin aspart (NOVOLOG) 100 UNIT/ML injection Inject 0-9 Units into the skin 3 (three) times daily with meals.  1 vial    . latanoprost (XALATAN) 0.005 % ophthalmic solution Place 1 drop into both eyes at bedtime.      Marland Kitchen lisinopril (PRINIVIL,ZESTRIL) 5 MG tablet Take 5 mg by mouth daily.      . metFORMIN (GLUCOPHAGE) 500 MG tablet Take 1,000 mg by mouth 2 (two) times daily with a meal.      . metoprolol tartrate (LOPRESSOR) 25 MG tablet Take 1 tablet (25 mg total) by mouth 2 (two) times daily.      Marland Kitchen oxyCODONE-acetaminophen (PERCOCET) 10-325 MG per tablet Take 1 tablet by mouth every 4 (four) hours as needed.      . pravastatin (PRAVACHOL) 40 MG tablet Take 40 mg by mouth at bedtime.      Marland Kitchen oxyCODONE-acetaminophen (PERCOCET/ROXICET) 5-325 MG per tablet Take 1 tablet by mouth every 6  (six) hours as needed for pain.  20 tablet  0   Current Facility-Administered Medications  Medication Dose Route Frequency Provider Last Rate Last Dose  . loratadine (CLARITIN) tablet 10 mg  10 mg Oral Daily Ivin Poot, MD        Physical Exam:  BP 150/96  Pulse 104  Resp 20  Ht 5\' 9"  (1.753 m)  Wt 248 lb (112.492 kg)  BMI 36.62 kg/m2  SpO2 98%  Gen: no apparent distress Lungs: CTA Bilaterally Heart: RRR Skin: wound vac in place on sternotomy, wound is clean, + granulation tissue present  Impression:  [name] [name] is doing very well.  Her sternal wound continues to heal without evidence of new infection.    Plan:  Return to clinic in one month for wound check.  Continue wound vac changes and follow up with Dr. [name].  I will give the patient a refill on her Percocet 5/325mg  one tablet Q6 prn pain, but explained that this medication should only be used for extreme pain.  She was given 20 tablets and will not be given any refills.

## 2012-11-22 NOTE — Progress Notes (Signed)
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  She is doing extremely well and healing the ulcer in.  Her epithelialization is markedly improved.  The ulcer is less than half the size it was originally.  There is no sign of infection.  The deep part is filling in as well on the superior aspect of the manubrium.  Overall she is doing much better.  There has been no change in medications or social history.  She states that she has not been engaging in any extracurricular drug activities and she has been invited to the diabetic nutrition class.  She is to continue with Endoform and negative pressure dressing and we will see her back in a week.     Theodoro Kos, DO     CS/MEDQ  D:  11/22/2012  T:  11/22/2012  Job:  [id number]

## 2012-11-29 ENCOUNTER — Encounter (HOSPITAL_BASED_OUTPATIENT_CLINIC_OR_DEPARTMENT_OTHER): Payer: Medicaid Other

## 2012-11-29 ENCOUNTER — Encounter (HOSPITAL_BASED_OUTPATIENT_CLINIC_OR_DEPARTMENT_OTHER): Payer: Medicaid Other | Attending: Plastic Surgery

## 2012-11-29 DIAGNOSIS — L98499 Non-pressure chronic ulcer of skin of other sites with unspecified severity: Secondary | ICD-10-CM | POA: Insufficient documentation

## 2012-11-29 DIAGNOSIS — Y838 Other surgical procedures as the cause of abnormal reaction of the patient, or of later complication, without mention of misadventure at the time of the procedure: Secondary | ICD-10-CM | POA: Insufficient documentation

## 2012-11-29 DIAGNOSIS — T8189XA Other complications of procedures, not elsewhere classified, initial encounter: Secondary | ICD-10-CM | POA: Insufficient documentation

## 2012-11-29 NOTE — Progress Notes (Signed)
[facility]  NAMECHIHIRO, GANZER                 ACCOUNT NO.:  [id number]  MEDICAL RECORD NO.:  [id number]      DATE OF BIRTH:  1951-07-25  PHYSICIAN:  Theodoro Kos, DO            VISIT DATE:                                  OFFICE VISIT   The patient is a 61 year old female who is here for followup on her chest ulcer, postsurgical nonhealing.  She is doing incredibly well. The inferior portion is nearly all healed.  The superior portion still has a pin-hole size depth to it, but overall markedly improved.  It has not even big enough for the [facility] at this point.  I recommend Endoform showers, and we will see her in followup.  If the deeper part has some trouble healing, we may need to go in and take out more bone, nothing looks infected at present, but we will see her back in followup.     Theodoro Kos, DO     CS/MEDQ  D:  11/29/2012  T:  11/29/2012  Job:  [id number]

## 2012-12-01 ENCOUNTER — Encounter: Payer: Self-pay | Admitting: Cardiothoracic Surgery

## 2012-12-01 ENCOUNTER — Ambulatory Visit (INDEPENDENT_AMBULATORY_CARE_PROVIDER_SITE_OTHER): Payer: Medicaid Other | Admitting: Cardiothoracic Surgery

## 2012-12-01 VITALS — BP 160/76 | HR 94 | Resp 18 | Ht 69.0 in | Wt 248.0 lb

## 2012-12-01 DIAGNOSIS — T8140XA Infection following a procedure, unspecified, initial encounter: Secondary | ICD-10-CM

## 2012-12-01 DIAGNOSIS — I2510 Atherosclerotic heart disease of native coronary artery without angina pectoris: Secondary | ICD-10-CM

## 2012-12-01 DIAGNOSIS — Z951 Presence of aortocoronary bypass graft: Secondary | ICD-10-CM

## 2012-12-01 DIAGNOSIS — T8132XA Disruption of internal operation (surgical) wound, not elsewhere classified, initial encounter: Secondary | ICD-10-CM

## 2012-12-01 NOTE — Progress Notes (Signed)
PCP is VAN Wilber Oliphant, MD Referring Provider is Dorna Mai, MD  Chief Complaint  Patient presents with  . Routine Post Op    4 week f/u for wound check of sternal infection, S/P CABG on 07/09/12    [id number] check for superficial sternal wound infection now healing and wound VAC out Followed at wound center with topical silver in place  Past Medical History  Diagnosis Date  . Hypertension   . Diabetes mellitus     diagnosed in 2008  . Glaucoma   . Hyperlipidemia   . CVA (cerebral infarction)     right internal capsule stroke in 12/2006  . Left-sided sensory deficit present   . Cocaine abuse     crack cocaine heavily until 2008 then sporatic use since then  . Tobacco abuse   . Thyroid nodule     FNA in [id number] showed follicular cells but not definate neoplasm  . Coronary artery disease   . Myocardial infarction   . Anginal pain   . Stroke     Past Surgical History  Procedure Date  . Coronary artery bypass graft 07/09/2012    Procedure: CORONARY ARTERY BYPASS GRAFTING (CABG);  Surgeon: Ivin Poot, MD;  Location: [place];  Service: Open Heart Surgery;  Laterality: N/A;  . Cardiac catheterization   . Sternal wound debridement 08/17/2012    Procedure: STERNAL WOUND DEBRIDEMENT;  Surgeon: Ivin Poot, MD;  Location: [place];  Service: Thoracic;  Laterality: N/A;  wound vac application  . Sternal wound debridement 08/24/2012    Procedure: STERNAL WOUND DEBRIDEMENT;  Surgeon: Ivin Poot, MD;  Location: [place];  Service: Thoracic;  Laterality: N/A;  . Sternal wound debridement 09/01/2012    Procedure: STERNAL WOUND DEBRIDEMENT;  Surgeon: Ivin Poot, MD;  Location: [place];  Service: Thoracic;  Laterality: N/A;  . Sternal wound debridement 09/20/2012    Procedure: STERNAL WOUND DEBRIDEMENT;  Surgeon: Ivin Poot, MD;  Location: [facility];  Service: Thoracic;  Laterality: N/A;  wound vac change    Family History  Problem Relation Age of Onset  . Other      no known  family CAD    Social History History  Substance Use Topics  . Smoking status: Current Every Day Smoker -- 1.0 packs/day  . Smokeless tobacco: Not on file  . Alcohol Use: No    Current Outpatient Prescriptions  Medication Sig Dispense Refill  . aspirin 325 MG tablet Take 325 mg by mouth daily.      . brinzolamide (AZOPT) 1 % ophthalmic suspension Place 1 drop into both eyes 3 (three) times daily.      . cetirizine (ZYRTEC) 10 MG tablet Take 10 mg by mouth daily.      Marland Kitchen dextrose 5 % SOLN 50 mL with cefTAZidime 1 G SOLR 1 g Inject 1 g into the vein every 8 (eight) hours.      . Fluticasone-Salmeterol (ADVAIR) 250-50 MCG/DOSE AEPB Inhale 1 puff into the lungs every 12 (twelve) hours.      Marland Kitchen glimepiride (AMARYL) 4 MG tablet Take 4 mg by mouth daily before breakfast.      . insulin aspart (NOVOLOG) 100 UNIT/ML injection Inject 0-9 Units into the skin 3 (three) times daily with meals.  1 vial    . latanoprost (XALATAN) 0.005 % ophthalmic solution Place 1 drop into both eyes at bedtime.      Marland Kitchen lisinopril (PRINIVIL,ZESTRIL) 5 MG tablet Take 5 mg by mouth daily.      Marland Kitchen  metFORMIN (GLUCOPHAGE) 500 MG tablet Take 1,000 mg by mouth 2 (two) times daily with a meal.      . metoprolol tartrate (LOPRESSOR) 25 MG tablet Take 1 tablet (25 mg total) by mouth 2 (two) times daily.      Marland Kitchen oxyCODONE-acetaminophen (PERCOCET) 10-325 MG per tablet Take 1 tablet by mouth every 4 (four) hours as needed.      Marland Kitchen oxyCODONE-acetaminophen (PERCOCET/ROXICET) 5-325 MG per tablet Take 1 tablet by mouth every 6 (six) hours as needed for pain.  20 tablet  0  . pravastatin (PRAVACHOL) 40 MG tablet Take 40 mg by mouth at bedtime.       Current Facility-Administered Medications  Medication Dose Route Frequency Provider Last Rate Last Dose  . loratadine (CLARITIN) tablet 10 mg  10 mg Oral Daily Ivin Poot, MD        No Known Allergies  Review of Systemsno fever  No angina  Still smoking BP 160/76  Pulse 94  Resp 18   Ht 5\' 9"  (1.753 m)  Wt 248 lb (112.492 kg)  BMI 36.62 kg/m2  SpO2 98% Physical Exam Lungs clear HR regular Sternum stable, wound small,dry and granulating  Diagnostic Tests: none  Impression:wound almost healed   Plan:final wound check in a month

## 2012-12-31 ENCOUNTER — Other Ambulatory Visit: Payer: Self-pay

## 2012-12-31 DIAGNOSIS — G8918 Other acute postprocedural pain: Secondary | ICD-10-CM

## 2012-12-31 MED ORDER — HYDROCODONE-ACETAMINOPHEN 5-325 MG PO TABS: 1.0000 | ORAL_TABLET | Freq: Four times a day (QID) | ORAL | Status: DC | PRN

## 2012-12-31 NOTE — Telephone Encounter (Signed)
Dr Prescott Gum ok' ed RX for Norco 5/325 mg 1 tab po every 6 hours prn/pain no refills. Pt was advised that there will be no more RX's for Percocet available. She understands

## 2013-01-03 ENCOUNTER — Encounter (HOSPITAL_BASED_OUTPATIENT_CLINIC_OR_DEPARTMENT_OTHER): Payer: Medicaid Other | Attending: Plastic Surgery

## 2013-01-03 DIAGNOSIS — L98499 Non-pressure chronic ulcer of skin of other sites with unspecified severity: Secondary | ICD-10-CM | POA: Insufficient documentation

## 2013-01-19 ENCOUNTER — Encounter: Payer: Self-pay | Admitting: Cardiothoracic Surgery

## 2013-01-19 ENCOUNTER — Ambulatory Visit (INDEPENDENT_AMBULATORY_CARE_PROVIDER_SITE_OTHER): Payer: Medicaid Other | Admitting: Cardiothoracic Surgery

## 2013-01-19 ENCOUNTER — Telehealth: Payer: Self-pay | Admitting: Cardiology

## 2013-01-19 VITALS — BP 163/89 | HR 99 | Resp 18 | Ht 69.0 in | Wt 248.0 lb

## 2013-01-19 DIAGNOSIS — I2510 Atherosclerotic heart disease of native coronary artery without angina pectoris: Secondary | ICD-10-CM

## 2013-01-19 DIAGNOSIS — T8132XA Disruption of internal operation (surgical) wound, not elsewhere classified, initial encounter: Secondary | ICD-10-CM

## 2013-01-19 DIAGNOSIS — Z951 Presence of aortocoronary bypass graft: Secondary | ICD-10-CM

## 2013-01-19 NOTE — Progress Notes (Signed)
Multiple organisms and required debridement wound VAC and home antibiotic therapy is. PCP is VAN Wilber Oliphant, MD Referring Provider is Dorna Mai, MD  Chief Complaint  Patient presents with  . Routine Post Op    6 week f/u for wound check, S/P CABG on 07/09/12    HPI: The patient is a 62 year old obese diabetic smoker who had urgent multivessel bypass grafting last summer. [name] [name] developed a superficial sternal wound infection which cultured multiple organisms and received surgical debridement, wound VAC therapy, and home IV antibiotic therapy. [name] [name] has been followed at the wound center by Dr. [name] most recently and has finally healed her sternal incision. Unfortunately [name] [name] is not taking her cardiac meds including Lopressor, lisinopril and [name] [name] is still actively smoking. [name] [name] is taking her aspirin and her Pravachol and diabetic medication but not measuring her blood sugar. [name] [name] had cardiac catheterization by Dr. Martinique and we will direct her back to  cardiology to establish care. [name] [name] is not interested in cardiac rehabilitation. [name] [name] understands the importance of total smoking cessation, improved diet, regular exercise, and compliance with medications to prevent recurrent coronary artery disease   Past Medical History  Diagnosis Date  . Hypertension   . Diabetes mellitus     diagnosed in 2008  . Glaucoma   . Hyperlipidemia   . CVA (cerebral infarction)     right internal capsule stroke in 12/2006  . Left-sided sensory deficit present   . Cocaine abuse     crack cocaine heavily until 2008 then sporatic use since then  . Tobacco abuse   . Thyroid nodule     FNA in [id number] showed follicular cells but not definate neoplasm  . Coronary artery disease   . Myocardial infarction   . Anginal pain   . Stroke     Past Surgical History  Procedure Date  . Coronary artery bypass graft 07/09/2012    Procedure: CORONARY ARTERY BYPASS GRAFTING (CABG);  Surgeon: Ivin Poot, MD;  Location: [facility];   Service: Open Heart Surgery;  Laterality: N/A;  . Cardiac catheterization   . Sternal wound debridement 08/17/2012    Procedure: STERNAL WOUND DEBRIDEMENT;  Surgeon: Ivin Poot, MD;  Location: [place];  Service: Thoracic;  Laterality: N/A;  wound vac application  . Sternal wound debridement 08/24/2012    Procedure: STERNAL WOUND DEBRIDEMENT;  Surgeon: Ivin Poot, MD;  Location: [place];  Service: Thoracic;  Laterality: N/A;  . Sternal wound debridement 09/01/2012    Procedure: STERNAL WOUND DEBRIDEMENT;  Surgeon: Ivin Poot, MD;  Location: [place];  Service: Thoracic;  Laterality: N/A;  . Sternal wound debridement 09/20/2012    Procedure: STERNAL WOUND DEBRIDEMENT;  Surgeon: Ivin Poot, MD;  Location: [facility] OR;  Service: Thoracic;  Laterality: N/A;  wound vac change    Family History  Problem Relation Age of Onset  . Other      no known family CAD    Social History History  Substance Use Topics  . Smoking status: Current Every Day Smoker -- 1.0 packs/day  . Smokeless tobacco: Not on file  . Alcohol Use: No    Current Outpatient Prescriptions  Medication Sig Dispense Refill  . aspirin 325 MG tablet Take 325 mg by mouth daily.      . brinzolamide (AZOPT) 1 % ophthalmic suspension Place 1 drop into both eyes 3 (three) times daily.      . cetirizine (ZYRTEC) 10 MG tablet Take 10 mg by mouth daily.      Marland Kitchen  Fluticasone-Salmeterol (ADVAIR) 250-50 MCG/DOSE AEPB Inhale 1 puff into the lungs every 12 (twelve) hours.      Marland Kitchen glimepiride (AMARYL) 4 MG tablet Take 4 mg by mouth daily before breakfast.      . HYDROcodone-acetaminophen (NORCO/VICODIN) 5-325 MG per tablet Take 1 tablet by mouth every 6 (six) hours as needed for pain.  40 tablet  0  . insulin aspart (NOVOLOG) 100 UNIT/ML injection Inject 0-9 Units into the skin 3 (three) times daily with meals.  1 vial    . latanoprost (XALATAN) 0.005 % ophthalmic solution Place 1 drop into both eyes at bedtime.      Marland Kitchen lisinopril  (PRINIVIL,ZESTRIL) 5 MG tablet Take 5 mg by mouth daily.      . metFORMIN (GLUCOPHAGE) 500 MG tablet Take 1,000 mg by mouth 2 (two) times daily with a meal.      . metoprolol tartrate (LOPRESSOR) 25 MG tablet Take 1 tablet (25 mg total) by mouth 2 (two) times daily.      . pravastatin (PRAVACHOL) 40 MG tablet Take 40 mg by mouth at bedtime.       Current Facility-Administered Medications  Medication Dose Route Frequency Provider Last Rate Last Dose  . loratadine (CLARITIN) tablet 10 mg  10 mg Oral Daily Ivin Poot, MD        No Known Allergies  Review of Systems no fever off antibiotics  complains of chronic right traumatic shoulder pain  BP 163/89  Pulse 99  Resp 18  Ht 5\' 9"  (1.753 m)  Wt 248 lb (112.492 kg)  BMI 36.62 kg/m2  SpO2 99% Physical Exam Alert and pleasant Sternal incision completely healed Lungs clear Cardiac rhythm regular No peripheral edema  Diagnostic Tests: No studies today  Impression: Healed sternal incision status post multivessel bypass grafting last summer  Plan: Return as needed. Patient needs to establish primary care and followup with cardiology.

## 2013-01-20 ENCOUNTER — Other Ambulatory Visit: Payer: Self-pay | Admitting: *Deleted

## 2013-01-25 NOTE — Progress Notes (Signed)
[facility]  [name], [name] [name]                 ACCOUNT NO.:  [id number]  MEDICAL RECORD NO.:  [id number]      DATE OF BIRTH:  06/27/1951  PHYSICIAN:  Theodoro Kos, DO       VISIT DATE:  01/24/2013                                  OFFICE VISIT   The patient is a 62 year old female who is here for followup on her sternal ulcers and clavicle ulcers.  She is doing very well and has completely healed up the area.  We will have the PICC line removed and she will follow up as needed.     Theodoro Kos, DO     CS/MEDQ  D:  01/24/2013  T:  01/25/2013  Job:  [id number]

## 2013-01-27 ENCOUNTER — Encounter: Payer: Self-pay | Admitting: Cardiology

## 2013-01-31 ENCOUNTER — Encounter (HOSPITAL_BASED_OUTPATIENT_CLINIC_OR_DEPARTMENT_OTHER): Payer: Medicaid Other

## 2013-02-08 ENCOUNTER — Encounter: Payer: Medicaid Other | Admitting: Cardiology

## 2013-02-22 ENCOUNTER — Encounter: Payer: Medicaid Other | Admitting: Cardiology

## 2013-03-23 ENCOUNTER — Encounter: Payer: Self-pay | Admitting: Cardiology

## 2013-03-23 ENCOUNTER — Ambulatory Visit (INDEPENDENT_AMBULATORY_CARE_PROVIDER_SITE_OTHER): Payer: Medicaid Other | Admitting: Cardiology

## 2013-03-23 VITALS — BP 128/80 | HR 74 | Ht 69.0 in | Wt 271.8 lb

## 2013-03-23 DIAGNOSIS — M19012 Primary osteoarthritis, left shoulder: Secondary | ICD-10-CM

## 2013-03-23 DIAGNOSIS — I10 Essential (primary) hypertension: Secondary | ICD-10-CM

## 2013-03-23 DIAGNOSIS — M19019 Primary osteoarthritis, unspecified shoulder: Secondary | ICD-10-CM

## 2013-03-23 DIAGNOSIS — E785 Hyperlipidemia, unspecified: Secondary | ICD-10-CM

## 2013-03-23 DIAGNOSIS — E1110 Type 2 diabetes mellitus with ketoacidosis without coma: Secondary | ICD-10-CM

## 2013-03-23 DIAGNOSIS — F17200 Nicotine dependence, unspecified, uncomplicated: Secondary | ICD-10-CM

## 2013-03-23 DIAGNOSIS — M19011 Primary osteoarthritis, right shoulder: Secondary | ICD-10-CM | POA: Insufficient documentation

## 2013-03-23 DIAGNOSIS — Z720 Tobacco use: Secondary | ICD-10-CM | POA: Insufficient documentation

## 2013-03-23 DIAGNOSIS — I2510 Atherosclerotic heart disease of native coronary artery without angina pectoris: Secondary | ICD-10-CM

## 2013-03-23 HISTORY — DX: Primary osteoarthritis, left shoulder: M19.012

## 2013-03-23 MED ORDER — NAPROXEN 500 MG PO TABS: 500.0000 mg | ORAL_TABLET | Freq: Two times a day (BID) | ORAL | Status: DC

## 2013-03-23 NOTE — Progress Notes (Signed)
[place] Date of Birth: 1951-12-16 Medical Record [id number]  History of Present Illness: [name] [name] is seen for followup today. She is a 62 year old black female who presented in July 2013 with acute chest pain in the setting of cocaine use. She had a non-ST elevation myocardial infarction.  Cardiac catheterization demonstrated severe two-vessel obstructive coronary disease involving the LAD and left circumflex in a left dominant system. LV function was severely reduced by cardiac catheterization with an ejection fraction of 25%. By echocardiogram her EF was much better at 55%. She underwent coronary bypass surgery by Dr. Prescott Gum on 07/09/2012. This included an LIMA graft to the LAD, saphenous vein graft to the ramus branch, and saphenous vein graft to the OM. Her preoperative ABIs were reduced at 0.73 on the right and 0.71 on the left. Her postoperative course was remarkable for elevated blood sugars and volume overload. After discharge she developed sternal dehiscence and infection. She underwent several surgical procedures for this. She has been followed in the wound care center. This is her first cardiac followup since her bypass surgery. On followup today the patient reports she is still smoking several cigarettes a day. She denies cocaine use. She is asking for additional pain medication. She states she needs pain medication for arthritis in her right shoulder. She reports her blood sugars are running in the 140s. Her blood pressure has been doing well. She is doing some walking. She is scheduled to establish care with the indigent clinic in April.  Current Outpatient Prescriptions on File Prior to Visit  Medication Sig Dispense Refill  . aspirin 325 MG tablet Take 325 mg by mouth daily.      . brinzolamide (AZOPT) 1 % ophthalmic suspension Place 1 drop into both eyes 3 (three) times daily.      . cetirizine (ZYRTEC) 10 MG tablet Take 10 mg by mouth daily.      . Fluticasone-Salmeterol  (ADVAIR) 250-50 MCG/DOSE AEPB Inhale 1 puff into the lungs every 12 (twelve) hours.      Marland Kitchen glimepiride (AMARYL) 4 MG tablet Take 4 mg by mouth daily before breakfast.      . HYDROcodone-acetaminophen (NORCO/VICODIN) 5-325 MG per tablet Take 1 tablet by mouth every 6 (six) hours as needed for pain.  40 tablet  0  . latanoprost (XALATAN) 0.005 % ophthalmic solution Place 1 drop into both eyes at bedtime.      Marland Kitchen lisinopril (PRINIVIL,ZESTRIL) 5 MG tablet Take 5 mg by mouth daily.      . metFORMIN (GLUCOPHAGE) 500 MG tablet Take 1,000 mg by mouth 2 (two) times daily with a meal.      . metoprolol tartrate (LOPRESSOR) 25 MG tablet Take 1 tablet (25 mg total) by mouth 2 (two) times daily.      . pravastatin (PRAVACHOL) 40 MG tablet Take 40 mg by mouth at bedtime.       Current Facility-Administered Medications on File Prior to Visit  Medication Dose Route Frequency Provider Last Rate Last Dose  . loratadine (CLARITIN) tablet 10 mg  10 mg Oral Daily Ivin Poot, MD        No Known Allergies  Past Medical History  Diagnosis Date  . Hypertension   . Diabetes mellitus     diagnosed in 2008  . Glaucoma   . Hyperlipidemia   . CVA (cerebral infarction)     right internal capsule stroke in 12/2006  . Left-sided sensory deficit present   . Cocaine abuse  crack cocaine heavily until 2008 then sporatic use since then  . Tobacco abuse   . Thyroid nodule     FNA in [id number] showed follicular cells but not definate neoplasm  . Coronary artery disease   . Myocardial infarction   . Anginal pain   . Stroke   . Arthritis of left shoulder region 03/23/2013    Past Surgical History  Procedure Laterality Date  . Coronary artery bypass graft  07/09/2012    Procedure: CORONARY ARTERY BYPASS GRAFTING (CABG);  Surgeon: Ivin Poot, MD;  Location: [place];  Service: Open Heart Surgery;  Laterality: N/A;  . Cardiac catheterization    . Sternal wound debridement  08/17/2012    Procedure: STERNAL WOUND  DEBRIDEMENT;  Surgeon: Ivin Poot, MD;  Location: [place];  Service: Thoracic;  Laterality: N/A;  wound vac application  . Sternal wound debridement  08/24/2012    Procedure: STERNAL WOUND DEBRIDEMENT;  Surgeon: Ivin Poot, MD;  Location: [place];  Service: Thoracic;  Laterality: N/A;  . Sternal wound debridement  09/01/2012    Procedure: STERNAL WOUND DEBRIDEMENT;  Surgeon: Ivin Poot, MD;  Location: [place];  Service: Thoracic;  Laterality: N/A;  . Sternal wound debridement  09/20/2012    Procedure: STERNAL WOUND DEBRIDEMENT;  Surgeon: Ivin Poot, MD;  Location: [facility] OR;  Service: Thoracic;  Laterality: N/A;  wound vac change    History  Smoking status  . Current Every Day Smoker -- 0.25 packs/day  Smokeless tobacco  . Not on file    History  Alcohol Use No    Family History  Problem Relation Age of Onset  . Other      no known family CAD    Review of Systems: The review of systems is positive for right shoulder pain.  All other systems were reviewed and are negative.  Physical Exam: BP 128/80  Pulse 74  Ht 5\' 9"  (1.753 m)  Wt 271 lb 12.8 oz (123.288 kg)  BMI 40.12 kg/m2 She is an obese black female in no acute distress. She is normocephalic, atraumatic. Pupils are equal round and reactive to light and accommodation. Extraocular movements are full. Oropharynx is clear. Neck is without jugular venous distention or bruits. There is no adenopathy or thyromegaly. Lungs: Clear Cardiovascular: Regular rate and rhythm without gallop, murmur, or click. Chest: Breast are pendulous. Her sternal incision has healed well. Abdomen: Obese, soft, nontender. Bowel sounds are positive. Extremities: No cyanosis or edema. Pedal pulses are good. Neuro: Alert and oriented x3. Cranial nerves II through XII are intact.  LABORATORY DATA: ECG demonstrates normal sinus rhythm with Q waves in leads V1 and V2. There is mild T wave inversion in leads 1 and aVL.  Assessment / Plan: 1.  Coronary disease status post non-ST elevation myocardial infarction in July 2013. Status post CABG x3. Clinically doing very well. No anginal symptoms or dyspnea.  2. Ischemic cardiomyopathy. Her ejection fraction by echo was 55%. She has no overt evidence of congestive heart failure. We will continue with her medical therapy. This currently includes lisinopril and metoprolol.  3. Sternal wound dehiscence. Now healed. Patient is considering plastic surgery in the future.  4. Diabetes mellitus type 2. Needs followup with primary care.  5. Tobacco abuse. I recommended completes smoking cessation.  6. History of cocaine abuse.  7. Arthritis right shoulder. Have refused to refill any narcotics for her given her high abuse potential. I recommended Naprosyn 500 mg twice a day  for arthritis.  8. Hyperlipidemia on pravastatin. I anticipate that she will have followup lab work with her primary care when seen in April. I will followup again in 6 months.

## 2013-03-23 NOTE — Patient Instructions (Signed)
Take Naproxen 500 mg twice a day with meals for arthritis pain.  Continue your other medications  I will see you in 6 months.

## 2013-03-30 ENCOUNTER — Other Ambulatory Visit: Payer: Self-pay | Admitting: Cardiothoracic Surgery

## 2013-07-11 ENCOUNTER — Other Ambulatory Visit: Payer: Self-pay | Admitting: Cardiology

## 2013-09-21 ENCOUNTER — Ambulatory Visit (INDEPENDENT_AMBULATORY_CARE_PROVIDER_SITE_OTHER): Payer: Medicaid Other | Admitting: Cardiology

## 2013-09-21 ENCOUNTER — Encounter: Payer: Self-pay | Admitting: Cardiology

## 2013-09-21 VITALS — BP 170/100 | HR 84 | Ht 68.5 in | Wt 280.0 lb

## 2013-09-21 DIAGNOSIS — I2510 Atherosclerotic heart disease of native coronary artery without angina pectoris: Secondary | ICD-10-CM

## 2013-09-21 DIAGNOSIS — Z951 Presence of aortocoronary bypass graft: Secondary | ICD-10-CM

## 2013-09-21 DIAGNOSIS — F17200 Nicotine dependence, unspecified, uncomplicated: Secondary | ICD-10-CM

## 2013-09-21 DIAGNOSIS — Z720 Tobacco use: Secondary | ICD-10-CM

## 2013-09-21 DIAGNOSIS — E785 Hyperlipidemia, unspecified: Secondary | ICD-10-CM

## 2013-09-21 DIAGNOSIS — I10 Essential (primary) hypertension: Secondary | ICD-10-CM

## 2013-09-21 MED ORDER — ASPIRIN 81 MG PO TABS: 81.0000 mg | ORAL_TABLET | Freq: Every day | ORAL | Status: DC

## 2013-09-21 NOTE — Progress Notes (Signed)
[place] Date of Birth: 06-25-1951 Medical Record [id number]  History of Present Illness: [name] [name] is seen for followup today. She is status post non-ST elevation myocardial infarction in July of 2013 in setting of cocaine use. She had severe two-vessel coronary disease and underwent coronary bypass surgery. LV function was severely reduced by cardiac catheterization with an ejection fraction of 25%. By echocardiogram her EF was much better at 55%. She underwent coronary bypass surgery by Dr. Prescott Gum on 07/09/2012.  Her preoperative ABIs were reduced at 0.73 on the right and 0.71 on the left. Her postoperative course was complicated by  sternal dehiscence and infection. She underwent several surgical procedures for this. She has been followed in the wound care center. Her wound has subsequently healed and she is planning to have plastic surgery at the beginning of next year. She denies any chest pain or shortness of breath. She is walking regularly. She was taking naproxen for arthritic pain but this caused her feet to break out with blisters and she quit taking this 2 weeks ago. Her blistering he is resolving now. She continues to smoke 6 cigarettes per day. She is now being followed by Dr. Kevan Ny has tried health.  Current Outpatient Prescriptions on File Prior to Visit  Medication Sig Dispense Refill  . brinzolamide (AZOPT) 1 % ophthalmic suspension Place 1 drop into both eyes 3 (three) times daily.      . cetirizine (ZYRTEC) 10 MG tablet Take 10 mg by mouth daily as needed.       . Fluticasone-Salmeterol (ADVAIR) 250-50 MCG/DOSE AEPB Inhale 1 puff into the lungs every 12 (twelve) hours.      . furosemide (LASIX) 40 MG tablet Take 40 mg by mouth daily.      Marland Kitchen glimepiride (AMARYL) 4 MG tablet Take 4 mg by mouth daily before breakfast.      . latanoprost (XALATAN) 0.005 % ophthalmic solution Place 1 drop into both eyes at bedtime.      Marland Kitchen lisinopril (PRINIVIL,ZESTRIL) 5 MG tablet Take  5 mg by mouth daily.      . metFORMIN (GLUCOPHAGE) 500 MG tablet Take 1,000 mg by mouth 2 (two) times daily with a meal.      . metoprolol tartrate (LOPRESSOR) 25 MG tablet Take 1 tablet (25 mg total) by mouth 2 (two) times daily.      . pravastatin (PRAVACHOL) 40 MG tablet Take 40 mg by mouth at bedtime.      . traMADol (ULTRAM) 50 MG tablet Take 50 mg by mouth as directed.       Current Facility-Administered Medications on File Prior to Visit  Medication Dose Route Frequency Provider Last Rate Last Dose  . loratadine (CLARITIN) tablet 10 mg  10 mg Oral Daily Ivin Poot, MD        Allergies  Allergen Reactions  . Naproxen     rash    Past Medical History  Diagnosis Date  . Hypertension   . Diabetes mellitus     diagnosed in 2008  . Glaucoma   . Hyperlipidemia   . CVA (cerebral infarction)     right internal capsule stroke in 12/2006  . Left-sided sensory deficit present   . Cocaine abuse     crack cocaine heavily until 2008 then sporatic use since then  . Tobacco abuse   . Thyroid nodule     FNA in [id number] showed follicular cells but not definate neoplasm  . Coronary artery disease   .  Myocardial infarction   . Anginal pain   . Stroke   . Arthritis of left shoulder region 03/23/2013    Past Surgical History  Procedure Laterality Date  . Coronary artery bypass graft  07/09/2012    Procedure: CORONARY ARTERY BYPASS GRAFTING (CABG);  Surgeon: Ivin Poot, MD;  Location: [place];  Service: Open Heart Surgery;  Laterality: N/A;  . Cardiac catheterization    . Sternal wound debridement  08/17/2012    Procedure: STERNAL WOUND DEBRIDEMENT;  Surgeon: Ivin Poot, MD;  Location: [place];  Service: Thoracic;  Laterality: N/A;  wound vac application  . Sternal wound debridement  08/24/2012    Procedure: STERNAL WOUND DEBRIDEMENT;  Surgeon: Ivin Poot, MD;  Location: [place];  Service: Thoracic;  Laterality: N/A;  . Sternal wound debridement  09/01/2012    Procedure: STERNAL  WOUND DEBRIDEMENT;  Surgeon: Ivin Poot, MD;  Location: [place];  Service: Thoracic;  Laterality: N/A;  . Sternal wound debridement  09/20/2012    Procedure: STERNAL WOUND DEBRIDEMENT;  Surgeon: Ivin Poot, MD;  Location: [facility] OR;  Service: Thoracic;  Laterality: N/A;  wound vac change    History  Smoking status  . Current Every Day Smoker -- 0.25 packs/day  Smokeless tobacco  . Not on file    History  Alcohol Use No    Family History  Problem Relation Age of Onset  . Other      no known family CAD    Review of Systems: The review of systems is positive for right shoulder pain.  All other systems were reviewed and are negative.  Physical Exam: BP 170/100  Pulse 84  Ht 5' 8.5" (1.74 m)  Wt 127.007 kg (280 lb)  BMI 41.95 kg/m2 She is an obese black female in no acute distress. She is normocephalic, atraumatic. Pupils are equal round and reactive to light and accommodation. Extraocular movements are full. Oropharynx is clear. Neck is without jugular venous distention or bruits. There is no adenopathy or thyromegaly. Lungs: Clear Cardiovascular: Regular rate and rhythm without gallop, murmur, or click. Chest: Breast are pendulous. Her sternal incision has healed well. Abdomen: Obese, soft, nontender. Bowel sounds are positive. Extremities: No cyanosis or edema. Pedal pulses are good. She does have multiple healing lesions on the soles of her feet. Neuro: Alert and oriented x3. Cranial nerves II through XII are intact.  LABORATORY DATA:   Assessment / Plan: 1. Coronary disease status post non-ST elevation myocardial infarction in July 2013. Status post CABG x3. Clinically doing very well. No anginal symptoms or dyspnea.  2. Ischemic cardiomyopathy. Her ejection fraction by echo was 55%. She has no overt evidence of congestive heart failure. We will continue with her medical therapy. This currently includes lisinopril and metoprolol.  3. Sternal wound dehiscence. Now  healed. Patient is considering plastic surgery in the future.  4. Diabetes mellitus type 2. Followed by primary care.  5. Tobacco abuse. I recommended completes smoking cessation.  6. History of cocaine abuse.  7. Arthritis right shoulder. Given her recent reaction to naproxen she needs to avoid any nonsteroidal anti-inflammatory drugs except for aspirin. I recommended she reduce aspirin to 81 mg daily.  8. Hyperlipidemia on pravastatin. I will request a copy of her most recent lab work from [facility].

## 2013-09-21 NOTE — Patient Instructions (Signed)
Reduce ASA to 81 mg daily  Avoid all nonsteroidal anti-inflammatory drugs like ibuprofen, naprosyn, Aleve, etc.  Continue your other therapy  I will request your lab work from [facility].  I will see you in 6 months.  Quit smoking completely.

## 2013-10-14 ENCOUNTER — Ambulatory Visit (HOSPITAL_COMMUNITY)
Admission: RE | Admit: 2013-10-14 | Discharge: 2013-10-14 | Disposition: A | Discharge status: Home / Self Care | Payer: Medicaid Other | Source: Ambulatory Visit | Attending: Cardiothoracic Surgery | Admitting: Cardiothoracic Surgery

## 2013-10-14 DIAGNOSIS — Z951 Presence of aortocoronary bypass graft: Secondary | ICD-10-CM | POA: Insufficient documentation

## 2014-02-20 ENCOUNTER — Encounter (HOSPITAL_BASED_OUTPATIENT_CLINIC_OR_DEPARTMENT_OTHER): Payer: Medicaid Other

## 2014-02-28 ENCOUNTER — Encounter (HOSPITAL_BASED_OUTPATIENT_CLINIC_OR_DEPARTMENT_OTHER): Payer: Medicaid Other | Attending: General Surgery

## 2014-10-13 NOTE — Telephone Encounter (Signed)
Nothing was typed/tmj 

## 2014-10-18 ENCOUNTER — Emergency Department (HOSPITAL_COMMUNITY): Payer: Medicaid Other

## 2014-10-18 ENCOUNTER — Inpatient Hospital Stay (HOSPITAL_COMMUNITY)
Admission: EM | Admit: 2014-10-18 | Discharge: 2014-10-23 | DRG: 202 | Disposition: A | Discharge status: Home / Self Care | Payer: Medicaid Other | Attending: Internal Medicine | Admitting: Internal Medicine

## 2014-10-18 DIAGNOSIS — Z951 Presence of aortocoronary bypass graft: Secondary | ICD-10-CM

## 2014-10-18 DIAGNOSIS — I252 Old myocardial infarction: Secondary | ICD-10-CM

## 2014-10-18 DIAGNOSIS — J209 Acute bronchitis, unspecified: Secondary | ICD-10-CM

## 2014-10-18 DIAGNOSIS — H409 Unspecified glaucoma: Secondary | ICD-10-CM | POA: Diagnosis present

## 2014-10-18 DIAGNOSIS — F17210 Nicotine dependence, cigarettes, uncomplicated: Secondary | ICD-10-CM | POA: Diagnosis present

## 2014-10-18 DIAGNOSIS — T380X5A Adverse effect of glucocorticoids and synthetic analogues, initial encounter: Secondary | ICD-10-CM | POA: Diagnosis present

## 2014-10-18 DIAGNOSIS — J9600 Acute respiratory failure, unspecified whether with hypoxia or hypercapnia: Secondary | ICD-10-CM

## 2014-10-18 DIAGNOSIS — E669 Obesity, unspecified: Secondary | ICD-10-CM | POA: Diagnosis present

## 2014-10-18 DIAGNOSIS — E785 Hyperlipidemia, unspecified: Secondary | ICD-10-CM | POA: Diagnosis present

## 2014-10-18 DIAGNOSIS — Z6841 Body Mass Index (BMI) 40.0 and over, adult: Secondary | ICD-10-CM | POA: Diagnosis not present

## 2014-10-18 DIAGNOSIS — Z79899 Other long term (current) drug therapy: Secondary | ICD-10-CM

## 2014-10-18 DIAGNOSIS — Z7982 Long term (current) use of aspirin: Secondary | ICD-10-CM

## 2014-10-18 DIAGNOSIS — E1122 Type 2 diabetes mellitus with diabetic chronic kidney disease: Secondary | ICD-10-CM

## 2014-10-18 DIAGNOSIS — R718 Other abnormality of red blood cells: Secondary | ICD-10-CM

## 2014-10-18 DIAGNOSIS — R0602 Shortness of breath: Secondary | ICD-10-CM

## 2014-10-18 DIAGNOSIS — D649 Anemia, unspecified: Secondary | ICD-10-CM | POA: Diagnosis present

## 2014-10-18 DIAGNOSIS — Z720 Tobacco use: Secondary | ICD-10-CM

## 2014-10-18 DIAGNOSIS — E119 Type 2 diabetes mellitus without complications: Secondary | ICD-10-CM | POA: Diagnosis present

## 2014-10-18 DIAGNOSIS — Z8673 Personal history of transient ischemic attack (TIA), and cerebral infarction without residual deficits: Secondary | ICD-10-CM

## 2014-10-18 DIAGNOSIS — I129 Hypertensive chronic kidney disease with stage 1 through stage 4 chronic kidney disease, or unspecified chronic kidney disease: Secondary | ICD-10-CM | POA: Diagnosis present

## 2014-10-18 DIAGNOSIS — I5032 Chronic diastolic (congestive) heart failure: Secondary | ICD-10-CM | POA: Diagnosis present

## 2014-10-18 DIAGNOSIS — D72829 Elevated white blood cell count, unspecified: Secondary | ICD-10-CM | POA: Diagnosis present

## 2014-10-18 DIAGNOSIS — N179 Acute kidney failure, unspecified: Secondary | ICD-10-CM | POA: Diagnosis present

## 2014-10-18 DIAGNOSIS — F1410 Cocaine abuse, uncomplicated: Secondary | ICD-10-CM

## 2014-10-18 DIAGNOSIS — I25810 Atherosclerosis of coronary artery bypass graft(s) without angina pectoris: Secondary | ICD-10-CM | POA: Diagnosis present

## 2014-10-18 DIAGNOSIS — J40 Bronchitis, not specified as acute or chronic: Secondary | ICD-10-CM | POA: Diagnosis not present

## 2014-10-18 DIAGNOSIS — J069 Acute upper respiratory infection, unspecified: Secondary | ICD-10-CM | POA: Diagnosis present

## 2014-10-18 DIAGNOSIS — I214 Non-ST elevation (NSTEMI) myocardial infarction: Secondary | ICD-10-CM

## 2014-10-18 DIAGNOSIS — E1129 Type 2 diabetes mellitus with other diabetic kidney complication: Secondary | ICD-10-CM

## 2014-10-18 DIAGNOSIS — R0902 Hypoxemia: Secondary | ICD-10-CM

## 2014-10-18 DIAGNOSIS — E118 Type 2 diabetes mellitus with unspecified complications: Secondary | ICD-10-CM

## 2014-10-18 DIAGNOSIS — I10 Essential (primary) hypertension: Secondary | ICD-10-CM | POA: Diagnosis present

## 2014-10-18 DIAGNOSIS — N183 Chronic kidney disease, stage 3 (moderate): Secondary | ICD-10-CM | POA: Diagnosis present

## 2014-10-18 DIAGNOSIS — M19012 Primary osteoarthritis, left shoulder: Secondary | ICD-10-CM | POA: Diagnosis present

## 2014-10-18 DIAGNOSIS — J9601 Acute respiratory failure with hypoxia: Secondary | ICD-10-CM | POA: Diagnosis present

## 2014-10-18 DIAGNOSIS — Z794 Long term (current) use of insulin: Secondary | ICD-10-CM

## 2014-10-18 DIAGNOSIS — B9789 Other viral agents as the cause of diseases classified elsewhere: Secondary | ICD-10-CM

## 2014-10-18 DIAGNOSIS — M19011 Primary osteoarthritis, right shoulder: Secondary | ICD-10-CM

## 2014-10-18 HISTORY — DX: Acute respiratory failure, unspecified whether with hypoxia or hypercapnia: J96.00

## 2014-10-18 LAB — BASIC METABOLIC PANEL
ANION GAP: 14 (ref 5–15)
BUN: 19 mg/dL (ref 6–23)
CALCIUM: 8.9 mg/dL (ref 8.4–10.5)
CO2: 26 meq/L (ref 19–32)
CREATININE: 1.52 mg/dL — AB (ref 0.50–1.10)
Chloride: 99 mEq/L (ref 96–112)
GFR calc Af Amer: 41 mL/min — ABNORMAL LOW (ref >90)
GFR calc non Af Amer: 35 mL/min — ABNORMAL LOW (ref >90)
Glucose, Bld: 271 mg/dL — ABNORMAL HIGH (ref 70–99)
Potassium: 3.8 mEq/L (ref 3.7–5.3)
Sodium: 139 mEq/L (ref 137–147)

## 2014-10-18 LAB — CBC WITH DIFFERENTIAL/PLATELET
BASOS ABS: 0 10*3/uL (ref 0.0–0.1)
BASOS PCT: 0 % (ref 0–1)
EOS PCT: 1 % (ref 0–5)
Eosinophils Absolute: 0.1 10*3/uL (ref 0.0–0.7)
HEMATOCRIT: 35.9 % — AB (ref 36.0–46.0)
Hemoglobin: 11.8 g/dL — ABNORMAL LOW (ref 12.0–15.0)
LYMPHS PCT: 24 % (ref 12–46)
Lymphs Abs: 2.3 10*3/uL (ref 0.7–4.0)
MCH: 25.3 pg — ABNORMAL LOW (ref 26.0–34.0)
MCHC: 32.9 g/dL (ref 30.0–36.0)
MCV: 77 fL — AB (ref 78.0–100.0)
MONO ABS: 1.1 10*3/uL — AB (ref 0.1–1.0)
Monocytes Relative: 11 % (ref 3–12)
Neutro Abs: 6.3 10*3/uL (ref 1.7–7.7)
Neutrophils Relative %: 64 % (ref 43–77)
Platelets: 323 10*3/uL (ref 150–400)
RBC: 4.66 MIL/uL (ref 3.87–5.11)
RDW: 17 % — AB (ref 11.5–15.5)
WBC: 9.8 10*3/uL (ref 4.0–10.5)

## 2014-10-18 LAB — URINALYSIS, ROUTINE W REFLEX MICROSCOPIC
GLUCOSE, UA: 100 mg/dL — AB
Ketones, ur: NEGATIVE mg/dL
Nitrite: NEGATIVE
Protein, ur: 100 mg/dL — AB
SPECIFIC GRAVITY, URINE: 1.025 (ref 1.005–1.030)
UROBILINOGEN UA: 1 mg/dL (ref 0.0–1.0)
pH: 5.5 (ref 5.0–8.0)

## 2014-10-18 LAB — BLOOD GAS, ARTERIAL
Acid-Base Excess: 1.2 mmol/L (ref 0.0–2.0)
Bicarbonate: 24.6 mEq/L — ABNORMAL HIGH (ref 20.0–24.0)
FIO2: 0.21 %
O2 Saturation: 91.2 %
PCO2 ART: 35.5 mmHg (ref 35.0–45.0)
PO2 ART: 57 mmHg — AB (ref 80.0–100.0)
Patient temperature: 97.6
TCO2: 22.1 mmol/L (ref 0–100)
pH, Arterial: 7.452 — ABNORMAL HIGH (ref 7.350–7.450)

## 2014-10-18 LAB — CBC
HEMATOCRIT: 34 % — AB (ref 36.0–46.0)
HEMOGLOBIN: 11.1 g/dL — AB (ref 12.0–15.0)
MCH: 25.5 pg — AB (ref 26.0–34.0)
MCHC: 32.6 g/dL (ref 30.0–36.0)
MCV: 78 fL (ref 78.0–100.0)
Platelets: 285 10*3/uL (ref 150–400)
RBC: 4.36 MIL/uL (ref 3.87–5.11)
RDW: 16.9 % — ABNORMAL HIGH (ref 11.5–15.5)
WBC: 9.7 10*3/uL (ref 4.0–10.5)

## 2014-10-18 LAB — RAPID URINE DRUG SCREEN, HOSP PERFORMED
AMPHETAMINES: NOT DETECTED
BARBITURATES: NOT DETECTED
BENZODIAZEPINES: NOT DETECTED
COCAINE: NOT DETECTED
Opiates: NOT DETECTED
TETRAHYDROCANNABINOL: NOT DETECTED

## 2014-10-18 LAB — TROPONIN I

## 2014-10-18 LAB — URINE MICROSCOPIC-ADD ON

## 2014-10-18 LAB — PRO B NATRIURETIC PEPTIDE: PRO B NATRI PEPTIDE: 2288 pg/mL — AB (ref 0–125)

## 2014-10-18 LAB — CBG MONITORING, ED: Glucose-Capillary: 265 mg/dL — ABNORMAL HIGH (ref 70–99)

## 2014-10-18 MED ORDER — DEXTROSE 5 % IV SOLN
500.0000 mg | INTRAVENOUS | Status: DC
  Administered 2014-10-19 – 2014-10-22 (×5): 500 mg via INTRAVENOUS
  Filled 2014-10-18 (×5): qty 500

## 2014-10-18 MED ORDER — LORATADINE 10 MG PO TABS
10.0000 mg | ORAL_TABLET | Freq: Every day | ORAL | Status: DC
  Administered 2014-10-19 – 2014-10-23 (×5): 10 mg via ORAL
  Filled 2014-10-18 (×5): qty 1

## 2014-10-18 MED ORDER — FOLIC ACID 1 MG PO TABS
1.0000 mg | ORAL_TABLET | Freq: Every day | ORAL | Status: DC
  Administered 2014-10-19 – 2014-10-23 (×5): 1 mg via ORAL
  Filled 2014-10-18 (×5): qty 1

## 2014-10-18 MED ORDER — METHYLPREDNISOLONE SODIUM SUCC 125 MG IJ SOLR
60.0000 mg | Freq: Four times a day (QID) | INTRAMUSCULAR | Status: DC
  Administered 2014-10-19 – 2014-10-20 (×6): 60 mg via INTRAVENOUS
  Filled 2014-10-18 (×11): qty 0.96

## 2014-10-18 MED ORDER — SODIUM CHLORIDE 0.9 % IJ SOLN
3.0000 mL | Freq: Two times a day (BID) | INTRAMUSCULAR | Status: DC
  Administered 2014-10-19 – 2014-10-22 (×6): 3 mL via INTRAVENOUS

## 2014-10-18 MED ORDER — BRINZOLAMIDE 1 % OP SUSP
1.0000 [drp] | Freq: Three times a day (TID) | OPHTHALMIC | Status: DC
  Administered 2014-10-19 – 2014-10-23 (×14): 1 [drp] via OPHTHALMIC
  Filled 2014-10-18: qty 10

## 2014-10-18 MED ORDER — TRAMADOL HCL 50 MG PO TABS
50.0000 mg | ORAL_TABLET | Freq: Three times a day (TID) | ORAL | Status: DC
  Administered 2014-10-19 – 2014-10-23 (×13): 50 mg via ORAL
  Filled 2014-10-18 (×13): qty 1

## 2014-10-18 MED ORDER — LISINOPRIL 5 MG PO TABS
5.0000 mg | ORAL_TABLET | Freq: Every day | ORAL | Status: DC
  Administered 2014-10-19 – 2014-10-23 (×5): 5 mg via ORAL
  Filled 2014-10-18 (×5): qty 1

## 2014-10-18 MED ORDER — IPRATROPIUM BROMIDE 0.02 % IN SOLN
0.5000 mg | Freq: Once | RESPIRATORY_TRACT | Status: AC
  Administered 2014-10-18: 0.5 mg via RESPIRATORY_TRACT
  Filled 2014-10-18: qty 2.5

## 2014-10-18 MED ORDER — METHYLPREDNISOLONE SODIUM SUCC 125 MG IJ SOLR
125.0000 mg | Freq: Once | INTRAMUSCULAR | Status: AC
  Administered 2014-10-18: 125 mg via INTRAVENOUS
  Filled 2014-10-18: qty 2

## 2014-10-18 MED ORDER — IPRATROPIUM-ALBUTEROL 0.5-2.5 (3) MG/3ML IN SOLN
3.0000 mL | RESPIRATORY_TRACT | Status: DC
  Administered 2014-10-19: 3 mL via RESPIRATORY_TRACT
  Filled 2014-10-18: qty 3

## 2014-10-18 MED ORDER — ADULT MULTIVITAMIN W/MINERALS CH
1.0000 | ORAL_TABLET | Freq: Every day | ORAL | Status: DC
  Administered 2014-10-19 – 2014-10-23 (×5): 1 via ORAL
  Filled 2014-10-18 (×5): qty 1

## 2014-10-18 MED ORDER — GLIMEPIRIDE 4 MG PO TABS
4.0000 mg | ORAL_TABLET | Freq: Every day | ORAL | Status: DC
  Administered 2014-10-19 – 2014-10-23 (×5): 4 mg via ORAL
  Filled 2014-10-18 (×7): qty 1

## 2014-10-18 MED ORDER — FUROSEMIDE 40 MG PO TABS
40.0000 mg | ORAL_TABLET | Freq: Every day | ORAL | Status: DC
  Administered 2014-10-19 – 2014-10-23 (×5): 40 mg via ORAL
  Filled 2014-10-18 (×5): qty 1

## 2014-10-18 MED ORDER — LATANOPROST 0.005 % OP SOLN
1.0000 [drp] | Freq: Every day | OPHTHALMIC | Status: DC
  Administered 2014-10-19 – 2014-10-22 (×5): 1 [drp] via OPHTHALMIC
  Filled 2014-10-18: qty 2.5

## 2014-10-18 MED ORDER — DEXTROSE 5 % IV SOLN
1.0000 g | Freq: Every day | INTRAVENOUS | Status: DC
  Administered 2014-10-19 – 2014-10-22 (×5): 1 g via INTRAVENOUS
  Filled 2014-10-18 (×5): qty 10

## 2014-10-18 MED ORDER — PRAVASTATIN SODIUM 40 MG PO TABS
40.0000 mg | ORAL_TABLET | Freq: Every day | ORAL | Status: DC
  Administered 2014-10-19 – 2014-10-22 (×5): 40 mg via ORAL
  Filled 2014-10-18 (×6): qty 1

## 2014-10-18 MED ORDER — ALBUTEROL SULFATE (2.5 MG/3ML) 0.083% IN NEBU
5.0000 mg | INHALATION_SOLUTION | Freq: Once | RESPIRATORY_TRACT | Status: AC
  Administered 2014-10-18: 5 mg via RESPIRATORY_TRACT
  Filled 2014-10-18: qty 6

## 2014-10-18 MED ORDER — ASPIRIN EC 81 MG PO TBEC
81.0000 mg | DELAYED_RELEASE_TABLET | Freq: Every day | ORAL | Status: DC
  Administered 2014-10-19 – 2014-10-23 (×5): 81 mg via ORAL
  Filled 2014-10-18 (×5): qty 1

## 2014-10-18 MED ORDER — HEPARIN SODIUM (PORCINE) 5000 UNIT/ML IJ SOLN
5000.0000 [IU] | Freq: Three times a day (TID) | INTRAMUSCULAR | Status: DC
  Administered 2014-10-19 – 2014-10-23 (×14): 5000 [IU] via SUBCUTANEOUS
  Filled 2014-10-18 (×17): qty 1

## 2014-10-18 MED ORDER — INSULIN ASPART 100 UNIT/ML ~~LOC~~ SOLN
0.0000 [IU] | Freq: Three times a day (TID) | SUBCUTANEOUS | Status: DC
  Administered 2014-10-19 (×3): 20 [IU] via SUBCUTANEOUS
  Administered 2014-10-20: 11 [IU] via SUBCUTANEOUS
  Administered 2014-10-20: 15 [IU] via SUBCUTANEOUS
  Administered 2014-10-20: 20 [IU] via SUBCUTANEOUS
  Administered 2014-10-21: 09:00:00 via SUBCUTANEOUS
  Administered 2014-10-21: 3 [IU] via SUBCUTANEOUS
  Administered 2014-10-21: 11 [IU] via SUBCUTANEOUS
  Administered 2014-10-22: 7 [IU] via SUBCUTANEOUS
  Administered 2014-10-22: 15 [IU] via SUBCUTANEOUS
  Administered 2014-10-22: 11 [IU] via SUBCUTANEOUS
  Administered 2014-10-23: 4 [IU] via SUBCUTANEOUS
  Administered 2014-10-23: 7 [IU] via SUBCUTANEOUS

## 2014-10-18 MED ORDER — MORPHINE SULFATE 2 MG/ML IJ SOLN
1.0000 mg | INTRAMUSCULAR | Status: DC | PRN
Start: 2014-10-18 — End: 2014-10-23

## 2014-10-18 MED ORDER — VITAMIN B-1 100 MG PO TABS
100.0000 mg | ORAL_TABLET | Freq: Every day | ORAL | Status: DC
  Administered 2014-10-19 – 2014-10-23 (×5): 100 mg via ORAL
  Filled 2014-10-18 (×5): qty 1

## 2014-10-18 MED ORDER — MOMETASONE FURO-FORMOTEROL FUM 100-5 MCG/ACT IN AERO
2.0000 | INHALATION_SPRAY | Freq: Two times a day (BID) | RESPIRATORY_TRACT | Status: DC
  Administered 2014-10-19 – 2014-10-23 (×10): 2 via RESPIRATORY_TRACT
  Filled 2014-10-18: qty 8.8

## 2014-10-18 MED ORDER — METFORMIN HCL 500 MG PO TABS
1000.0000 mg | ORAL_TABLET | Freq: Two times a day (BID) | ORAL | Status: DC
  Administered 2014-10-19 – 2014-10-22 (×7): 1000 mg via ORAL
  Filled 2014-10-18 (×10): qty 2

## 2014-10-18 MED ORDER — ZOLPIDEM TARTRATE 5 MG PO TABS: 5.0000 mg | ORAL_TABLET | Freq: Every evening | ORAL | Status: DC | PRN

## 2014-10-18 MED ORDER — METOPROLOL TARTRATE 25 MG PO TABS
25.0000 mg | ORAL_TABLET | Freq: Two times a day (BID) | ORAL | Status: DC
Start: 2014-10-18 — End: 2014-10-23
  Administered 2014-10-19 – 2014-10-23 (×10): 25 mg via ORAL
  Filled 2014-10-18 (×11): qty 1

## 2014-10-18 MED ORDER — SODIUM CHLORIDE 0.9 % IV SOLN
INTRAVENOUS | Status: DC
  Administered 2014-10-19 – 2014-10-20 (×2): via INTRAVENOUS

## 2014-10-18 MED ORDER — SODIUM CHLORIDE 0.9 % IV BOLUS (SEPSIS)
500.0000 mL | Freq: Once | INTRAVENOUS | Status: AC
  Administered 2014-10-18: 500 mL via INTRAVENOUS

## 2014-10-18 NOTE — ED Notes (Signed)
Pt made aware of need for urine. Pt states she is unable to provide specimen at this time.

## 2014-10-18 NOTE — H&P (Signed)
[facility] History and Physical  [name] [name] [id number] DOB: February 17, 1951 DOA: 10/18/2014  Referring physician: Abigail Butts PA PCP: Kevan Ny, MD   Chief Complaint: Shortness of Breath  HPI: [name] [name] is a 63 y.o. female presents with increased shortness of breath. Patient states that she has been sick since about 9 days. She has had cough and congestion. She states there has been increased wheeze noted. Patient has some sputum production. She states that she has no chest pain noted at this time but has tightness. Patient states no abdominal pain though she states on the left side has slight tenderness. She admits to fevers and has some chills noted. She has also noted nausea and diarrhea noted. Patient denies any travel. Patient does smoke and still is actively smoking.   Review of Systems:  Constitutional:  No weight loss, night sweats, ++Fevers, ++chills, ++fatigue.  HEENT:  No headaches, Difficulty swallowing,Tooth/dental problems,Sore throat  Cardio-vascular:  No chest pain, Orthopnea, PND, swelling in lower extremities  GI:  No heartburn, indigestion, abdominal pain, ++nausea, vomiting, ++diarrhea  Resp:  No shortness of breath with exertion or at rest. No coughing up of blood.++wheezing  Skin:  no rash or lesions GU:  no dysuria, change in color of urine.  Musculoskeletal:  No joint pain or swelling.   Psych:  No change in mood or affect.   Past Medical History  Diagnosis Date  . Hypertension   . Diabetes mellitus     diagnosed in 2008  . Glaucoma   . Hyperlipidemia   . CVA (cerebral infarction)     right internal capsule stroke in 12/2006  . Left-sided sensory deficit present   . Cocaine abuse     crack cocaine heavily until 2008 then sporatic use since then  . Tobacco abuse   . Thyroid nodule     FNA in [id number] showed follicular cells but not definate neoplasm  . Coronary artery disease   . Myocardial infarction   . Anginal pain   .  Stroke   . 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 Coronary artery bypass graft  07/09/2012    Procedure: CORONARY ARTERY BYPASS GRAFTING (CABG);  Surgeon: Ivin Poot, MD;  Location: [place];  Service: Open Heart Surgery;  Laterality: N/A;  . Cardiac catheterization    . Sternal wound debridement  08/17/2012    Procedure: STERNAL WOUND DEBRIDEMENT;  Surgeon: Ivin Poot, MD;  Location: [place];  Service: Thoracic;  Laterality: N/A;  wound vac application  . Sternal wound debridement  08/24/2012    Procedure: STERNAL WOUND DEBRIDEMENT;  Surgeon: Ivin Poot, MD;  Location: [place];  Service: Thoracic;  Laterality: N/A;  . Sternal wound debridement  09/01/2012    Procedure: STERNAL WOUND DEBRIDEMENT;  Surgeon: Ivin Poot, MD;  Location: [place];  Service: Thoracic;  Laterality: N/A;  . Sternal wound debridement  09/20/2012    Procedure: STERNAL WOUND DEBRIDEMENT;  Surgeon: Ivin Poot, MD;  Location: [facility];  Service: Thoracic;  Laterality: N/A;  wound vac change   Social History:  reports that she has been smoking.  She does not have any smokeless tobacco history on file. She reports that she uses illicit drugs (Cocaine). She reports that she does not drink alcohol.  Allergies  Allergen Reactions  . Naproxen     rash    Family History  Problem Relation Age of Onset  .  Other      no known family CAD     Prior to Admission medications   Medication Sig Start Date End Date Taking? Authorizing Provider  aspirin 81 MG tablet Take 1 tablet (81 mg total) by mouth daily. 09/21/13   Peter M Martinique, MD  brinzolamide (AZOPT) 1 % ophthalmic suspension Place 1 drop into both eyes 3 (three) times daily.    Historical Provider, MD  cetirizine (ZYRTEC) 10 MG tablet Take 10 mg by mouth daily as needed.     Historical Provider, MD  Fluticasone-Salmeterol (ADVAIR) 250-50 MCG/DOSE AEPB Inhale 1 puff into the lungs every 12 (twelve) hours.     Historical Provider, MD  furosemide (LASIX) 40 MG tablet Take 40 mg by mouth daily.    Historical Provider, MD  glimepiride (AMARYL) 4 MG tablet Take 4 mg by mouth daily before breakfast.    Historical Provider, MD  latanoprost (XALATAN) 0.005 % ophthalmic solution Place 1 drop into both eyes at bedtime.    Historical Provider, MD  lisinopril (PRINIVIL,ZESTRIL) 5 MG tablet Take 5 mg by mouth daily.    Historical Provider, MD  metFORMIN (GLUCOPHAGE) 500 MG tablet Take 1,000 mg by mouth 2 (two) times daily with a meal.    Historical Provider, MD  metoprolol tartrate (LOPRESSOR) 25 MG tablet Take 1 tablet (25 mg total) by mouth 2 (two) times daily. 08/22/12   [name], PA-C  pravastatin (PRAVACHOL) 40 MG tablet Take 40 mg by mouth at bedtime.    Historical Provider, MD  traMADol (ULTRAM) 50 MG tablet Take 50 mg by mouth as directed.    Historical Provider, MD   Physical Exam: Filed Vitals:   10/18/14 1440 10/18/14 1646 10/18/14 1859  BP: 140/92 144/76   Pulse: 80 81 110  Temp: 98.7 F (37.1 C) 97.6 F (36.4 C)   TempSrc: Oral Oral   Resp: 16 18   SpO2: 97% 99% 88%    Wt Readings from Last 3 Encounters:  09/21/13 127.007 kg (280 lb)  03/23/13 123.288 kg (271 lb 12.8 oz)  01/19/13 112.492 kg (248 lb)    General:  Appears calm and comfortable Eyes: PERRL, normal lids, irises & conjunctiva ENT: grossly normal hearing, lips & tongue Neck: no LAD, masses or thyromegaly Cardiovascular: RRR, no m/r/g. No LE edema. Respiratory: good air entry bilaterally. Ronchi are audible bilaterally Abdomen: soft, ntnd Skin: no rash or induration seen on limited exam Musculoskeletal: grossly normal tone BUE/BLE Psychiatric: grossly normal mood and affect, speech fluent and appropriate Neurologic: grossly non-focal.          Labs on Admission:  Basic Metabolic Panel:  Recent Labs Lab 10/18/14 1637  NA 139  K 3.8  CL 99  CO2 26  GLUCOSE 271*  BUN 19  CREATININE 1.52*  CALCIUM  8.9   Liver Function Tests: No results found for this basename: AST, ALT, ALKPHOS, BILITOT, PROT, ALBUMIN,  in the last 168 hours No results found for this basename: LIPASE, AMYLASE,  in the last 168 hours No results found for this basename: AMMONIA,  in the last 168 hours CBC:  Recent Labs Lab 10/18/14 1637  WBC 9.8  NEUTROABS 6.3  HGB 11.8*  HCT 35.9*  MCV 77.0*  PLT 323   Cardiac Enzymes:  Recent Labs Lab 10/18/14 1637  TROPONINI <0.30    BNP (last 3 results)  Recent Labs  10/18/14 1637  PROBNP 2288.0*   CBG: No results found for this basename: GLUCAP,  in the  last 168 hours  Radiological Exams on Admission: Dg Chest 2 View  10/18/2014   CLINICAL DATA:  Productive cough and short of breath  EXAM: CHEST  2 VIEW  COMPARISON:  10/14/2013  FINDINGS: CABG changes. Interstitial edema is present with fluid in the major fissure. No significant layering pleural effusion. Negative for pneumonia.  IMPRESSION: Congestive heart failure with interstitial edema.   Electronically Signed   By: Franchot Gallo M.D.   On: 10/18/2014 16:44     Assessment/Plan Principal Problem:   Acute respiratory failure Active Problems:   Hyperlipidemia   Hypertension   Type 2 diabetes mellitus   1. Acute respiratory failure -likely related to bronchitis -will admit for IV antibiotics -start on steroids -will continue with oxygen therapy -check ABG  2. Diabetes Mellitus Type 2 -will place on FS coverage -will continue on home medications  3. Hyperlipidemia -continue with home medications  4. Hypertension -will continue with home medications -monitor pressures    Code Status: Full Code (must indicate code status--if unknown or must be presumed, indicate so) DVT Prophylaxis:heparin Family Communication: Husband (indicate person spoken with, if applicable, with phone number if by telephone) Disposition Plan: Home (indicate anticipated LOS)  Time spent: 82min  Ashley Montminy  A [facility] Pager [phone]

## 2014-10-18 NOTE — ED Notes (Signed)
Patient transported to X-ray 

## 2014-10-18 NOTE — ED Provider Notes (Signed)
CSN: [id number]     Arrival date & time 10/18/14  1437 History   First MD Initiated Contact with Patient 10/18/14 1502     Chief Complaint  Patient presents with  . Cough     (Consider location/radiation/quality/duration/timing/severity/associated sxs/prior Treatment) The history is provided by the patient and medical records. No language interpreter was used.    [name] [name] is a 63 y.o. female  with a hx of HTN, NIDDM, asthma, CABG (2013), MI presents to the Emergency Department complaining of gradual, persistent, progressively worsening cough and congestion  Onset 1 week. Associated symptoms include fever (100 T MAX), cough, chills, nasal congestion, generalized headache, decreased appetite.  Pt has tried Copywriter, advertising and albuterol inhaler without significant relief.  Pt reports she smokes 3 cigarettes per day, but smoked 2 ppd until 2 mos ago.   No aggravating or alleviating factors.  Pt denies hemoptysis, peripheral edema, chest pain, dysuria, hematuria.  Pt denies recent travel out of the [place] or sick contacts.    Pt reports CBGs at home of > 300 with associated polyuria and polydipsia which is abnormal for her.     Past Medical History  Diagnosis Date  . Hypertension   . Diabetes mellitus     diagnosed in 2008  . Glaucoma   . Hyperlipidemia   . CVA (cerebral infarction)     right internal capsule stroke in 12/2006  . Left-sided sensory deficit present   . Cocaine abuse     crack cocaine heavily until 2008 then sporatic use since then  . Tobacco abuse   . Thyroid nodule     FNA in [id number] showed follicular cells but not definate neoplasm  . Coronary artery disease   . Myocardial infarction   . Anginal pain   . Stroke   . 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 Coronary artery bypass graft  07/09/2012    Procedure: CORONARY ARTERY BYPASS GRAFTING (CABG);  Surgeon: Ivin Poot, MD;  Location: [place];  Service: Open  Heart Surgery;  Laterality: N/A;  . Cardiac catheterization    . Sternal wound debridement  08/17/2012    Procedure: STERNAL WOUND DEBRIDEMENT;  Surgeon: Ivin Poot, MD;  Location: [place];  Service: Thoracic;  Laterality: N/A;  wound vac application  . Sternal wound debridement  08/24/2012    Procedure: STERNAL WOUND DEBRIDEMENT;  Surgeon: Ivin Poot, MD;  Location: [place];  Service: Thoracic;  Laterality: N/A;  . Sternal wound debridement  09/01/2012    Procedure: STERNAL WOUND DEBRIDEMENT;  Surgeon: Ivin Poot, MD;  Location: [place];  Service: Thoracic;  Laterality: N/A;  . Sternal wound debridement  09/20/2012    Procedure: STERNAL WOUND DEBRIDEMENT;  Surgeon: Ivin Poot, MD;  Location: [facility] OR;  Service: Thoracic;  Laterality: N/A;  wound vac change   Family History  Problem Relation Age of Onset  . Other      no known family CAD   History  Substance Use Topics  . Smoking status: Current Every Day Smoker -- 0.25 packs/day  . Smokeless tobacco: Not on file  . Alcohol Use: No   OB History   Grav Para Term Preterm Abortions TAB SAB Ect Mult Living                 Review of Systems  Constitutional: Positive for fatigue. Negative for fever, chills and appetite change.  HENT: Positive for  congestion, postnasal drip, rhinorrhea, sinus pressure and sore throat. Negative for ear discharge, ear pain and mouth sores.   Eyes: Negative for visual disturbance.  Respiratory: Positive for cough, chest tightness, shortness of breath and wheezing. Negative for stridor.   Cardiovascular: Negative for chest pain, palpitations and leg swelling.  Gastrointestinal: Negative for nausea, vomiting, abdominal pain and diarrhea.  Genitourinary: Negative for dysuria, urgency, frequency and hematuria.  Musculoskeletal: Negative for arthralgias, back pain, myalgias and neck stiffness.  Skin: Negative for rash.  Neurological: Positive for headaches. Negative for syncope, light-headedness and  numbness.  Hematological: Negative for adenopathy.  Psychiatric/Behavioral: The patient is not nervous/anxious.   All other systems reviewed and are negative.     Allergies  Naproxen  Home Medications   Prior to Admission medications   Medication Sig Start Date End Date Taking? Authorizing Provider  aspirin 81 MG tablet Take 1 tablet (81 mg total) by mouth daily. 09/21/13   Peter M Martinique, MD  brinzolamide (AZOPT) 1 % ophthalmic suspension Place 1 drop into both eyes 3 (three) times daily.    Historical Provider, MD  cetirizine (ZYRTEC) 10 MG tablet Take 10 mg by mouth daily as needed.     Historical Provider, MD  Fluticasone-Salmeterol (ADVAIR) 250-50 MCG/DOSE AEPB Inhale 1 puff into the lungs every 12 (twelve) hours.    Historical Provider, MD  furosemide (LASIX) 40 MG tablet Take 40 mg by mouth daily.    Historical Provider, MD  glimepiride (AMARYL) 4 MG tablet Take 4 mg by mouth daily before breakfast.    Historical Provider, MD  latanoprost (XALATAN) 0.005 % ophthalmic solution Place 1 drop into both eyes at bedtime.    Historical Provider, MD  lisinopril (PRINIVIL,ZESTRIL) 5 MG tablet Take 5 mg by mouth daily.    Historical Provider, MD  metFORMIN (GLUCOPHAGE) 500 MG tablet Take 1,000 mg by mouth 2 (two) times daily with a meal.    Historical Provider, MD  metoprolol tartrate (LOPRESSOR) 25 MG tablet Take 1 tablet (25 mg total) by mouth 2 (two) times daily. 08/22/12   [name], PA-C  pravastatin (PRAVACHOL) 40 MG tablet Take 40 mg by mouth at bedtime.    Historical Provider, MD  traMADol (ULTRAM) 50 MG tablet Take 50 mg by mouth as directed.    Historical Provider, MD   BP 144/76  Pulse 110  Temp(Src) 97.6 F (36.4 C) (Oral)  Resp 18  SpO2 88% Physical Exam  Nursing note and vitals reviewed. Constitutional: She is oriented to person, place, and time. She appears well-developed and well-nourished. No distress.  Awake, alert, nontoxic appearance  HENT:  Head:  Normocephalic and atraumatic.  Right Ear: Tympanic membrane, external ear and ear canal normal.  Left Ear: Tympanic membrane, external ear and ear canal normal.  Nose: Mucosal edema and rhinorrhea present. No epistaxis. Right sinus exhibits no maxillary sinus tenderness and no frontal sinus tenderness. Left sinus exhibits no maxillary sinus tenderness and no frontal sinus tenderness.  Mouth/Throat: Uvula is midline, oropharynx is clear and moist and mucous membranes are normal. Mucous membranes are not pale and not cyanotic. No oropharyngeal exudate, posterior oropharyngeal edema, posterior oropharyngeal erythema or tonsillar abscesses.  Eyes: Conjunctivae are normal. Pupils are equal, round, and reactive to light. No scleral icterus.  Neck: Normal range of motion and full passive range of motion without pain. Neck supple.  Cardiovascular: Normal rate, regular rhythm, normal heart sounds and intact distal pulses.   No murmur heard. Pulmonary/Chest: Effort normal. No accessory  muscle usage or stridor. Not tachypneic. No respiratory distress. She has decreased breath sounds. She has no wheezes.  Diminished breath sounds throughout without focal wheezes, rhonchi or rales  Abdominal: Soft. Bowel sounds are normal. She exhibits no mass. There is no tenderness. There is no rebound and no guarding.  Musculoskeletal: Normal range of motion. She exhibits no edema.  Lymphadenopathy:    She has no cervical adenopathy.  Neurological: She is alert and oriented to person, place, and time. She exhibits normal muscle tone. Coordination normal.  Speech is clear and goal oriented Moves extremities without ataxia  Skin: Skin is warm and dry. No rash noted. She is not diaphoretic.  Psychiatric: She has a normal mood and affect.    ED Course  Procedures (including critical care time) Labs Review Labs Reviewed  CBC WITH DIFFERENTIAL - Abnormal; Notable for the following:    Hemoglobin 11.8 (*)    HCT 35.9 (*)     MCV 77.0 (*)    MCH 25.3 (*)    RDW 17.0 (*)    Monocytes Absolute 1.1 (*)    All other components within normal limits  BASIC METABOLIC PANEL - Abnormal; Notable for the following:    Glucose, Bld 271 (*)    Creatinine, Ser 1.52 (*)    GFR calc non Af Amer 35 (*)    GFR calc Af Amer 41 (*)    All other components within normal limits  URINALYSIS, ROUTINE W REFLEX MICROSCOPIC - Abnormal; Notable for the following:    Color, Urine AMBER (*)    APPearance CLOUDY (*)    Glucose, UA 100 (*)    Hgb urine dipstick MODERATE (*)    Bilirubin Urine SMALL (*)    Protein, ur 100 (*)    Leukocytes, UA TRACE (*)    All other components within normal limits  URINE MICROSCOPIC-ADD ON - Abnormal; Notable for the following:    Casts GRANULAR CAST (*)    All other components within normal limits  PRO B NATRIURETIC PEPTIDE - Abnormal; Notable for the following:    Pro B Natriuretic peptide (BNP) 2288.0 (*)    All other components within normal limits  URINE RAPID DRUG SCREEN (HOSP PERFORMED)  TROPONIN I  CBG MONITORING, ED    Imaging Review Dg Chest 2 View  10/18/2014   CLINICAL DATA:  Productive cough and short of breath  EXAM: CHEST  2 VIEW  COMPARISON:  10/14/2013  FINDINGS: CABG changes. Interstitial edema is present with fluid in the major fissure. No significant layering pleural effusion. Negative for pneumonia.  IMPRESSION: Congestive heart failure with interstitial edema.   Electronically Signed   By: Franchot Gallo M.D.   On: 10/18/2014 16:44     EKG Interpretation   Date/Time:  Wednesday October 18 2014 17:39:54 EDT Ventricular Rate:  89 PR Interval:  147 QRS Duration: 87 QT Interval:  384 QTC Calculation: 467 R Axis:   9 Text Interpretation:  Sinus rhythm Probable left atrial enlargement LVH  with secondary repolarization abnormality Anterior Q waves, possibly due  to LVH Baseline wander in lead(s) II III aVF V2 V3 V4 V5 V6 TWI in  previous EKG not present  Confirmed by  YAO  MD, DAVID ([id number]) on  10/18/2014 6:27:21 PM      MDM   Final diagnoses:  SOB (shortness of breath)  Viral URI with cough  Hypoxia  Tobacco abuse  Essential hypertension  Acute kidney injury   TRUST AHEARN presents with  SOB, cough and URI symptoms for several days.  Pt also with generalized weakness.  Will give albuterol, CXR and basic labs as pt reports CBGs in the 300s at home.    5:24 PM Pt reports feeling better after breathing treatment, but refused 2nd treatment.  CXR with CHF, will obtain ECG, troponin and BNP.  Pt with elevated creatinine; ?mild dehydration, but will be careful with fluid bolus.  No evidence of DKA.    7:26 PM Pt with desaturation into the 80's with ambulation.  Will need admission and IV steroids.  ECG improved from last, troponin normal and BNP elevated, with some contribution from her mild AKI.    Discussed with Dr. Humphrey Rolls who will admit to tele.   The patient was discussed with and seen by Dr. Darl Householder who agrees with the treatment plan.  BP 144/76  Pulse 110  Temp(Src) 97.6 F (36.4 C) (Oral)  Resp 18  SpO2 88% (with ambulation)  .    Abigail Butts, PA-C 10/18/14 1931

## 2014-10-18 NOTE — ED Notes (Signed)
Pt c/o cough with clear sputum and fatigue x 1 week.

## 2014-10-19 ENCOUNTER — Encounter (HOSPITAL_COMMUNITY): Payer: Self-pay | Admitting: *Deleted

## 2014-10-19 DIAGNOSIS — I10 Essential (primary) hypertension: Secondary | ICD-10-CM

## 2014-10-19 DIAGNOSIS — Z720 Tobacco use: Secondary | ICD-10-CM

## 2014-10-19 DIAGNOSIS — J209 Acute bronchitis, unspecified: Secondary | ICD-10-CM

## 2014-10-19 DIAGNOSIS — J9601 Acute respiratory failure with hypoxia: Secondary | ICD-10-CM

## 2014-10-19 DIAGNOSIS — E785 Hyperlipidemia, unspecified: Secondary | ICD-10-CM

## 2014-10-19 DIAGNOSIS — E118 Type 2 diabetes mellitus with unspecified complications: Secondary | ICD-10-CM

## 2014-10-19 LAB — GLUCOSE, CAPILLARY
GLUCOSE-CAPILLARY: 360 mg/dL — AB (ref 70–99)
GLUCOSE-CAPILLARY: 486 mg/dL — AB (ref 70–99)
GLUCOSE-CAPILLARY: 582 mg/dL — AB (ref 70–99)
Glucose-Capillary: 496 mg/dL — ABNORMAL HIGH (ref 70–99)
Glucose-Capillary: 499 mg/dL — ABNORMAL HIGH (ref 70–99)
Glucose-Capillary: 398 mg/dL — ABNORMAL HIGH (ref 70–99)

## 2014-10-19 LAB — CBC
HEMATOCRIT: 33.8 % — AB (ref 36.0–46.0)
Hemoglobin: 10.8 g/dL — ABNORMAL LOW (ref 12.0–15.0)
MCH: 24.9 pg — ABNORMAL LOW (ref 26.0–34.0)
MCHC: 32 g/dL (ref 30.0–36.0)
MCV: 78.1 fL (ref 78.0–100.0)
Platelets: 288 10*3/uL (ref 150–400)
RBC: 4.33 MIL/uL (ref 3.87–5.11)
RDW: 17 % — ABNORMAL HIGH (ref 11.5–15.5)
WBC: 10.2 10*3/uL (ref 4.0–10.5)

## 2014-10-19 LAB — INFLUENZA PANEL BY PCR (TYPE A & B)
H1N1FLUPCR: NOT DETECTED
Influenza A By PCR: NEGATIVE
Influenza B By PCR: NEGATIVE

## 2014-10-19 LAB — MRSA PCR SCREENING: MRSA by PCR: NEGATIVE

## 2014-10-19 LAB — COMPREHENSIVE METABOLIC PANEL
ALK PHOS: 105 U/L (ref 39–117)
ALT: 13 U/L (ref 0–35)
ANION GAP: 15 (ref 5–15)
AST: 21 U/L (ref 0–37)
Albumin: 2.5 g/dL — ABNORMAL LOW (ref 3.5–5.2)
BUN: 20 mg/dL (ref 6–23)
CHLORIDE: 98 meq/L (ref 96–112)
CO2: 22 mEq/L (ref 19–32)
Calcium: 8.8 mg/dL (ref 8.4–10.5)
Creatinine, Ser: 1.35 mg/dL — ABNORMAL HIGH (ref 0.50–1.10)
GFR calc non Af Amer: 41 mL/min — ABNORMAL LOW (ref >90)
GFR, EST AFRICAN AMERICAN: 47 mL/min — AB (ref >90)
GLUCOSE: 522 mg/dL — AB (ref 70–99)
POTASSIUM: 3.7 meq/L (ref 3.7–5.3)
Sodium: 135 mEq/L — ABNORMAL LOW (ref 137–147)
Total Bilirubin: 0.3 mg/dL (ref 0.3–1.2)
Total Protein: 7.4 g/dL (ref 6.0–8.3)

## 2014-10-19 LAB — MAGNESIUM: Magnesium: 2.2 mg/dL (ref 1.5–2.5)

## 2014-10-19 LAB — CREATININE, SERUM
Creatinine, Ser: 1.44 mg/dL — ABNORMAL HIGH (ref 0.50–1.10)
GFR calc Af Amer: 44 mL/min — ABNORMAL LOW (ref >90)
GFR calc non Af Amer: 38 mL/min — ABNORMAL LOW (ref >90)

## 2014-10-19 LAB — HIV ANTIBODY (ROUTINE TESTING W REFLEX): HIV 1&2 Ab, 4th Generation: NONREACTIVE

## 2014-10-19 LAB — TROPONIN I
Troponin I: <0.3 ng/mL (ref <0.30)
Troponin I: <0.3 ng/mL (ref <0.30)
Troponin I: <0.3 ng/mL (ref <0.30)

## 2014-10-19 LAB — HEMOGLOBIN A1C
HEMOGLOBIN A1C: 7 % — AB (ref <5.7)
Mean Plasma Glucose: 154 mg/dL — ABNORMAL HIGH (ref <117)

## 2014-10-19 LAB — STREP PNEUMONIAE URINARY ANTIGEN: Strep Pneumo Urinary Antigen: NEGATIVE

## 2014-10-19 LAB — TSH: TSH: 0.963 u[IU]/mL (ref 0.350–4.500)

## 2014-10-19 MED ORDER — INSULIN GLARGINE 100 UNIT/ML ~~LOC~~ SOLN
10.0000 [IU] | Freq: Once | SUBCUTANEOUS | Status: AC
  Administered 2014-10-19: 10 [IU] via SUBCUTANEOUS
  Filled 2014-10-19: qty 0.1

## 2014-10-19 MED ORDER — POTASSIUM CHLORIDE CRYS ER 20 MEQ PO TBCR
40.0000 meq | EXTENDED_RELEASE_TABLET | Freq: Once | ORAL | Status: AC
  Administered 2014-10-19: 40 meq via ORAL
  Filled 2014-10-19: qty 2

## 2014-10-19 MED ORDER — INSULIN ASPART 100 UNIT/ML ~~LOC~~ SOLN
20.0000 [IU] | Freq: Once | SUBCUTANEOUS | Status: AC
  Administered 2014-10-19: 20 [IU] via SUBCUTANEOUS

## 2014-10-19 MED ORDER — INSULIN GLARGINE 100 UNIT/ML ~~LOC~~ SOLN
6.0000 [IU] | Freq: Once | SUBCUTANEOUS | Status: AC
  Administered 2014-10-19: 6 [IU] via SUBCUTANEOUS
  Filled 2014-10-19 (×2): qty 0.06

## 2014-10-19 MED ORDER — INFLUENZA VAC SPLIT QUAD 0.5 ML IM SUSY
0.5000 mL | PREFILLED_SYRINGE | INTRAMUSCULAR | Status: AC
  Administered 2014-10-20: 0.5 mL via INTRAMUSCULAR
  Filled 2014-10-19 (×2): qty 0.5

## 2014-10-19 MED ORDER — IPRATROPIUM-ALBUTEROL 0.5-2.5 (3) MG/3ML IN SOLN
3.0000 mL | Freq: Three times a day (TID) | RESPIRATORY_TRACT | Status: DC
  Administered 2014-10-19 – 2014-10-23 (×13): 3 mL via RESPIRATORY_TRACT
  Filled 2014-10-19 (×12): qty 3

## 2014-10-19 MED ORDER — INSULIN GLARGINE 100 UNIT/ML ~~LOC~~ SOLN
10.0000 [IU] | SUBCUTANEOUS | Status: AC
Start: 2014-10-19 — End: 2014-10-19
  Administered 2014-10-19: 10 [IU] via SUBCUTANEOUS
  Filled 2014-10-19: qty 0.1

## 2014-10-19 MED ORDER — ALBUTEROL SULFATE (2.5 MG/3ML) 0.083% IN NEBU: 2.5000 mg | INHALATION_SOLUTION | Freq: Four times a day (QID) | RESPIRATORY_TRACT | Status: DC | PRN

## 2014-10-19 MED ORDER — INSULIN GLARGINE 100 UNIT/ML ~~LOC~~ SOLN
26.0000 [IU] | Freq: Every day | SUBCUTANEOUS | Status: DC
  Administered 2014-10-20 – 2014-10-23 (×4): 26 [IU] via SUBCUTANEOUS
  Filled 2014-10-19 (×4): qty 0.26

## 2014-10-19 MED ORDER — INSULIN ASPART 100 UNIT/ML ~~LOC~~ SOLN
6.0000 [IU] | Freq: Three times a day (TID) | SUBCUTANEOUS | Status: DC
  Administered 2014-10-19 – 2014-10-20 (×5): 6 [IU] via SUBCUTANEOUS
  Administered 2014-10-21: 10:00:00 via SUBCUTANEOUS
  Administered 2014-10-21 – 2014-10-23 (×7): 6 [IU] via SUBCUTANEOUS

## 2014-10-19 MED ORDER — PNEUMOCOCCAL VAC POLYVALENT 25 MCG/0.5ML IJ INJ
0.5000 mL | INJECTION | INTRAMUSCULAR | Status: AC
  Administered 2014-10-20: 0.5 mL via INTRAMUSCULAR
  Filled 2014-10-19 (×2): qty 0.5

## 2014-10-19 NOTE — ED Provider Notes (Signed)
Medical screening examination/treatment/procedure(s) were conducted as a shared visit with non-physician practitioner(s) and myself.  I personally evaluated the patient during the encounter.   EKG Interpretation   Date/Time:  Wednesday October 18 2014 17:39:54 EDT Ventricular Rate:  89 PR Interval:  147 QRS Duration: 87 QT Interval:  384 QTC Calculation: 467 R Axis:   9 Text Interpretation:  Sinus rhythm Probable left atrial enlargement LVH  with secondary repolarization abnormality Anterior Q waves, possibly due  to LVH Baseline wander in lead(s) II III aVF V2 V3 V4 V5 V6 TWI in  previous EKG not present  Confirmed by YAO  MD, DAVID ([id number]) on  10/18/2014 6:27:21 PM      [name] [name] is a 63 y.o. female hx of HTN, diabetes, CAGB, Mi here with SOB, congestion for the last week. Also had subjective fevers and dec appetite. On exam, chronically ill appearing. Oxygen low 90s, desat to 88 with ambulation. + diffuse wheezing on exam, no retractions. No edema. Heart exam unremarkable. Labs showed slightly elevated Cr, increased BNP. CXR showed some pul edema. However, patient doesn't appear in CHF on exam. Held lasix due to renal failure. Given IV steroids, nebs. Will admit.    Wandra Arthurs, MD 10/19/14 [phone]

## 2014-10-19 NOTE — Progress Notes (Signed)
Clinical Social Work Department BRIEF PSYCHOSOCIAL ASSESSMENT 10/19/2014  Patient:  [name] [name], [name] [name]     Account Number:  [id number]     Admit date:  10/18/2014  Clinical Social Worker:  Earlie Server  Date/Time:  10/19/2014 04:30 PM  Referred by:  Physician  Date Referred:  10/19/2014 Referred for  Psychosocial assessment   Other Referral:   Interview type:  Patient Other interview type:    PSYCHOSOCIAL DATA Living Status:  OTHER Admitted from facility:   Level of care:   Primary support name:  Reggie Primary support relationship to patient:  FRIEND Degree of support available:   Strong    CURRENT CONCERNS Current Concerns  Post-Acute Placement   Other Concerns:   Homelessness    SOCIAL WORK ASSESSMENT / PLAN CSW received referral in order to assist with homeless issues. CSW reviewed chart and met with patient at bedside. CSW introduced myself and explained role.    Patient reports she is currently living at [facility] and felt SOB and came to the hospital. Patient agreeable to discuss psychosocial needs and reports that she was living in an apartment with her boyfriend ([place]) but they got evicted. Patient reports that she was staying with a friend but recently moved into [facility] this past Monday. Patient reports she has 60 days left to stay the [facility] and she and boyfriend are saving up money in order to get another apartment. CSW offered housing resources and low income housing but patient reports she already has an apartment in mind but just needs to save her money. Patient reports she is compliant with doctor appointments and taking her medication and reports no needs.    CSW will continue to follow to provide support during hospital stay.   Assessment/plan status:  Referral to [employer] Other assessment/ plan:   Information/referral to community resources:   Housing information    PATIENT'S/FAMILY'S RESPONSE TO PLAN OF CARE: Patient  alert and oriented and engaged in assessment. Patient reports she is a positive person and does not mind staying the shelter until she is able to find permanent housing. Patient considers herself a religious person and reports that God will help her during this time. Patient thanked CSW for visit but reports no immediate needs.       [place], [place] [phone]

## 2014-10-19 NOTE — Progress Notes (Signed)
Inpatient Diabetes Program Recommendations  AACE/ADA: New Consensus Statement on Inpatient Glycemic Control (2013)  Target Ranges:  Prepandial:   less than 140 mg/dL      Peak postprandial:   less than 180 mg/dL (1-2 hours)      Critically ill patients:  140 - 180 mg/dL     Results for VEEHA, BACKES (MRN [id number]) as of 10/19/2014 10:04  Ref. Range 10/18/2014 16:19 10/19/2014 00:12 10/19/2014 07:36  Glucose-Capillary Latest Range: 70-99 mg/dL 265 (H) 486 (H) 496 (H)    Results for [name] [name], [name] [name] (MRN [id number]) as of 10/19/2014 10:04  Ref. Range 10/18/2014 23:07  Hemoglobin A1C Latest Range: <5.7 % 7.0 (H)    Admitted with Acute Respiratory Failure.  History of DM2, HTN, CVA, Drug Abuse.  Home DM Meds: Amaryl 4 mg daily       Metformin 1000 mg bid  Current DM Meds: Amaryl 4 mg daily         Metformin 1000 mg bid         Novolog Resistant SSI   **Note patient currently receiving IV Solumedrol 60 mg Q6 hours (started last night at 8pm).  **Note patient received 10 units Lantus X one dose at 1am.  **Patient well controlled at home as evidenced by A1c of 7%.  CBGs elevated due to steroids.    MD- Please consider the following in-hospital insulin adjustments while patient getting IV steroids:  1. Add standing Lantus dose- Lantus 24 units daily (0.2 units/kg dosing- pt weight 120 kg) 2. Add Novolog Meal Coverage- Novolog 6 units tid with meals    Will follow Wyn Quaker RN, MSN, CDE Diabetes Coordinator Inpatient Diabetes Program Team Pager: [phone] (8a-10p)

## 2014-10-19 NOTE — Progress Notes (Addendum)
[facility]                                                                              Patient Demographics  [name] [name], is a 63 y.o. female, DOB - Jun 16, 1951, [id number]  Admit date - 10/18/2014   Admitting Physician Allyne Gee, MD  Outpatient Primary MD for the patient is Marlou Sa, ERIC, MD  LOS - 1   Chief Complaint  Patient presents with  . Cough      HPI on 10/18/2014 [name] [name] is a 63 y.o. female presented with increased shortness of breath. Patient stated that she has been sick for about 9 days. She has had cough and congestion. She states there has been increased wheeze noted. Patient has had some sputum production. She stated that she has no chest pain noted at the time of admission but had tightness. Patient stated no abdominal pain though she stated on the left side has slight tenderness. She admitted to fevers and has some chills noted. She has also noted nausea and diarrhea. Patient denied any travel. Patient does smoke and still is actively smoking.   Assessment & Plan   Acute hypoxic respiratory failure secondary to bronchitis -Patient states her SOB has improved -Patient was noted to be hypoxic upon admission, SpO2 88% -Continue azithromycin, ceftriaxone, DuoNeb treatments, IV steroids  Diabetes mellitus, type II -Hemoglobin A1c 7 -Patient is not having her medications for approximately 2 months -Diabetes complicated with steroid use -Will continue insulin sliding scale with CBG monitoring, Lantus 26 units daily as well as NovoLog 7 units pre-meal while in hospital -Patient will likely be discharged with her metformin and Amaryl  Hyperlipidemia -Continue statin  Hypertension -Continue Lasix, lisinopril  Chronic kidney disease, stage III -Creatinine currently appears stable,continue to monitor BMP  Chronic diastolic heart failure -Echocardiogram in July 2013 shows an EF of 55%, with a grade 1 diastolic dysfunction -BNP was slightly  elevated at 2000 (which may be complicated by CKD) -Patient does not appear to be overloaded, currently euvolemic and compensated -Continue lasix -Will continue to monitor daily weights, intake and output  Normocytic anemia -Hemoglobin appears to be stable, baseline approximately 10.5-11 -Continue to monitor CBC  Tobacco abuse -Smoking cessation counseling given   Code Status: Full  Family Communication: None at bedside  Disposition Plan: Admitted  Time Spent in minutes   30 minutes  Procedures  None  Consults   None  DVT Prophylaxis  heparin  Lab Results  Component Value Date   PLT 288 10/19/2014    Medications  Scheduled Meds: . aspirin EC  81 mg Oral Daily  . azithromycin  500 mg Intravenous Q24H  . brinzolamide  1 drop Both Eyes TID  . cefTRIAXone (ROCEPHIN)  IV  1 g Intravenous QHS  . folic acid  1 mg Oral Daily  . furosemide  40 mg Oral Daily  . glimepiride  4 mg Oral QAC breakfast  . heparin  5,000 Units Subcutaneous 3 times per day  . [START ON 10/20/2014] Influenza vac split quadrivalent PF  0.5 mL Intramuscular Tomorrow-1000  . insulin aspart  0-20 Units Subcutaneous TID WC  . ipratropium-albuterol  3 mL Nebulization TID  .  latanoprost  1 drop Both Eyes QHS  . lisinopril  5 mg Oral Daily  . loratadine  10 mg Oral Daily  . metFORMIN  1,000 mg Oral BID WC  . methylPREDNISolone (SOLU-MEDROL) injection  60 mg Intravenous Q6H  . metoprolol tartrate  25 mg Oral BID  . mometasone-formoterol  2 puff Inhalation BID  . multivitamin with minerals  1 tablet Oral Daily  . [START ON 10/20/2014] pneumococcal 23 valent vaccine  0.5 mL Intramuscular Tomorrow-1000  . pravastatin  40 mg Oral QHS  . sodium chloride  3 mL Intravenous Q12H  . thiamine  100 mg Oral Daily  . traMADol  50 mg Oral TID   Continuous Infusions: . sodium chloride 50 mL/hr at 10/19/14 0034   PRN Meds:.albuterol, morphine injection, zolpidem  Antibiotics    Anti-infectives   Start      Dose/Rate Route Frequency Ordered Stop   10/18/14 2359  azithromycin (ZITHROMAX) 500 mg in dextrose 5 % 250 mL IVPB     500 mg 250 mL/hr over 60 Minutes Intravenous Every 24 hours 10/18/14 2306 10/25/14 2359   10/18/14 2330  cefTRIAXone (ROCEPHIN) 1 g in dextrose 5 % 50 mL IVPB     1 g 100 mL/hr over 30 Minutes Intravenous Daily at bedtime 10/18/14 2306 10/25/14 2159        Subjective:   [name] [name] seen and examined today.  Patient feels her breathing has improved since admission. She states she still has cough. She denies any chest pain, abdominal pain, nausea, vomiting, headache, dizziness at this time.  Objective:   Filed Vitals:   10/18/14 2300 10/19/14 0000 10/19/14 0533 10/19/14 0900  BP: 168/76  167/61 154/69  Pulse: 98   83  Temp: 98.8 F (37.1 C)  98.4 F (36.9 C) 98.1 F (36.7 C)  TempSrc: Oral  Oral Oral  Resp: 20  22 20   Height: 5\' 8"  (1.727 m)     Weight: 119.614 kg (263 lb 11.2 oz)     SpO2: 94% 96% 96% 94%    Wt Readings from Last 3 Encounters:  10/18/14 119.614 kg (263 lb 11.2 oz)  09/21/13 127.007 kg (280 lb)  03/23/13 123.288 kg (271 lb 12.8 oz)     Intake/Output Summary (Last 24 hours) at 10/19/14 1113 Last data filed at 10/19/14 0842  Gross per 24 hour  Intake    120 ml  Output    500 ml  Net   -380 ml    Exam  General: Well developed, well nourished, NAD, appears stated age  HEENT: NCAT, PERRLA, EOMI, Anicteic Sclera, mucous membranes moist.   Neck: Supple, no JVD, no masses  Cardiovascular: S1 S2 auscultated, no rubs, murmurs or gallops. Regular rate and rhythm.  Respiratory: Upper airway congestion, good air entry, scattered rhonchi  Abdomen: Soft,obese, nontender, nondistended, + bowel sounds  Extremities: warm dry without cyanosis clubbing or edema  Neuro: AAOx3, cranial nerves grossly intact. Strength 5/5 in patient's upper and lower extremities bilaterally  Skin: Without rashes exudates or nodules  Psych: Normal affect  and demeanor with intact judgement and insight  Data Review   Micro Results Recent Results (from the past 240 hour(s))  MRSA PCR SCREENING     Status: None   Collection Time    10/19/14  6:45 AM      Result Value Ref Range Status   MRSA by PCR NEGATIVE  NEGATIVE Final   Comment:  The GeneXpert MRSA Assay (FDA     approved for NASAL specimens     only), is one component of a     comprehensive MRSA colonization     surveillance program. It is not     intended to diagnose MRSA     infection nor to guide or     monitor treatment for     MRSA infections.     Performed at [facility]    Radiology Reports Dg Chest 2 View  10/18/2014   CLINICAL DATA:  Productive cough and short of breath  EXAM: CHEST  2 VIEW  COMPARISON:  10/14/2013  FINDINGS: CABG changes. Interstitial edema is present with fluid in the major fissure. No significant layering pleural effusion. Negative for pneumonia.  IMPRESSION: Congestive heart failure with interstitial edema.   Electronically Signed   By: Franchot Gallo M.D.   On: 10/18/2014 16:44    CBC  Recent Labs Lab 10/18/14 1637 10/18/14 2307 10/19/14 0508  WBC 9.8 9.7 10.2  HGB 11.8* 11.1* 10.8*  HCT 35.9* 34.0* 33.8*  PLT 323 285 288  MCV 77.0* 78.0 78.1  MCH 25.3* 25.5* 24.9*  MCHC 32.9 32.6 32.0  RDW 17.0* 16.9* 17.0*  LYMPHSABS 2.3  --   --   MONOABS 1.1*  --   --   EOSABS 0.1  --   --   BASOSABS 0.0  --   --     Chemistries   Recent Labs Lab 10/18/14 1637 10/18/14 2307 10/19/14 0508  NA 139  --  135*  K 3.8  --  3.7  CL 99  --  98  CO2 26  --  22  GLUCOSE 271*  --  522*  BUN 19  --  20  CREATININE 1.52* 1.44* 1.35*  CALCIUM 8.9  --  8.8  MG  --   --  2.2  AST  --   --  21  ALT  --   --  13  ALKPHOS  --   --  105  BILITOT  --   --  0.3   ------------------------------------------------------------------------------------------------------------------ estimated creatinine clearance is 58 ml/min (by C-G  formula based on Cr of 1.35). ------------------------------------------------------------------------------------------------------------------  Recent Labs  10/18/14 2307  HGBA1C 7.0*   ------------------------------------------------------------------------------------------------------------------ No results found for this basename: CHOL, HDL, LDLCALC, TRIG, CHOLHDL, LDLDIRECT,  in the last 72 hours ------------------------------------------------------------------------------------------------------------------  Recent Labs  10/18/14 2330  TSH 0.963   ------------------------------------------------------------------------------------------------------------------ No results found for this basename: VITAMINB12, FOLATE, FERRITIN, TIBC, IRON, RETICCTPCT,  in the last 72 hours  Coagulation profile No results found for this basename: INR, PROTIME,  in the last 168 hours  No results found for this basename: DDIMER,  in the last 72 hours  Cardiac Enzymes  Recent Labs Lab 10/18/14 1637 10/18/14 2307 10/19/14 0508  TROPONINI <0.30 <0.30 <0.30   ------------------------------------------------------------------------------------------------------------------ No components found with this basename: POCBNP,     Tamieka Rancourt D.O. on 10/19/2014 at 11:13 AM  Between 7am to 7pm - Pager - [phone]  After 7pm go to [email or web address] - password [email or web address]  And look for the night coverage person covering for me after hours  [facility] Office  [phone]

## 2014-10-19 NOTE — Progress Notes (Signed)
Inpatient Diabetes Program Recommendations  AACE/ADA: New Consensus Statement on Inpatient Glycemic Control (2013)  Target Ranges:  Prepandial:   less than 140 mg/dL      Peak postprandial:   less than 180 mg/dL (1-2 hours)      Critically ill patients:  140 - 180 mg/dL     Addendum: Spoke with Dr. Ree Kida earlier this AM about patient.  Per Dr. Ree Kida, patient having issues with homelessness.  Has not taken oral DM medications (Amaryl and Metformin) for approximately 2 months.  Would like to avoid having patient take insulin as an outpatient.  Agree.  A1c actually shows decent control (7%) despite patient not taking medications for 2 months.  Note patient will likely need insulin in hospital while on IV steroids, however, will likely do well on Amaryl and Metformin as an outpatient.  Recommend continuation of these two oral anti-hyperglycemic drugs at time of d/c.  Patient has Medicaid and can likely get both of these drugs for very little cost.    Will follow Wyn Quaker RN, MSN, CDE Diabetes Coordinator Inpatient Diabetes Program Team Pager: [phone] (8a-10p)

## 2014-10-19 NOTE — Progress Notes (Signed)
Nutrition Brief Note  Patient identified on the Malnutrition Screening Tool (MST) Report  Wt Readings from Last 15 Encounters:  10/18/14 263 lb 11.2 oz (119.614 kg)  09/21/13 280 lb (127.007 kg)  03/23/13 271 lb 12.8 oz (123.288 kg)  01/19/13 248 lb (112.492 kg)  12/01/12 248 lb (112.492 kg)  11/08/12 248 lb (112.492 kg)  10/14/12 248 lb (112.492 kg)  09/21/12 256 lb 1.6 oz (116.166 kg)  09/21/12 256 lb 1.6 oz (116.166 kg)  09/21/12 256 lb 1.6 oz (116.166 kg)  09/21/12 256 lb 1.6 oz (116.166 kg)  09/21/12 256 lb 1.6 oz (116.166 kg)  08/16/12 248 lb (112.492 kg)  08/04/12 257 lb (116.574 kg)  07/15/12 255 lb 8.2 oz (115.9 kg)    Body mass index is 40.1 kg/(m^2). Patient meets criteria for morbid obesity based on current BMI.   Pt reports good appetite and eating very well. PTA pt was not eating that well d/t a cold. Over a year's time, pt has lost 17 lb (6% x 1 year), this is not significant for the time frame. Pt has Hx of diabetes, manages with medications. HgbA1c of 7.0. Glucose: 522. Provided pt with brief diet education. Pt states she does have sodas to drink. Discussed switching to sugar-free beverages.  Current diet order is Heart Healthy/Carb modified, patient is consuming approximately 100% of meals at this time. Labs and medications reviewed.   No nutrition interventions warranted at this time. If nutrition issues arise, please consult RD.   Clayton Bibles, MS, RD, LDN Pager: [phone] After Hours Pager: [phone]

## 2014-10-20 LAB — BASIC METABOLIC PANEL
ANION GAP: 14 (ref 5–15)
BUN: 29 mg/dL — ABNORMAL HIGH (ref 6–23)
CO2: 23 mEq/L (ref 19–32)
Calcium: 8.9 mg/dL (ref 8.4–10.5)
Chloride: 103 mEq/L (ref 96–112)
Creatinine, Ser: 1.34 mg/dL — ABNORMAL HIGH (ref 0.50–1.10)
GFR, EST AFRICAN AMERICAN: 48 mL/min — AB (ref >90)
GFR, EST NON AFRICAN AMERICAN: 41 mL/min — AB (ref >90)
GLUCOSE: 412 mg/dL — AB (ref 70–99)
Potassium: 4.4 mEq/L (ref 3.7–5.3)
SODIUM: 140 meq/L (ref 137–147)

## 2014-10-20 LAB — GLUCOSE, CAPILLARY
GLUCOSE-CAPILLARY: 400 mg/dL — AB (ref 70–99)
GLUCOSE-CAPILLARY: 253 mg/dL — AB (ref 70–99)
Glucose-Capillary: 393 mg/dL — ABNORMAL HIGH (ref 70–99)
Glucose-Capillary: 317 mg/dL — ABNORMAL HIGH (ref 70–99)
Glucose-Capillary: 196 mg/dL — ABNORMAL HIGH (ref 70–99)

## 2014-10-20 LAB — CBC
HCT: 35.3 % — ABNORMAL LOW (ref 36.0–46.0)
HEMOGLOBIN: 11.1 g/dL — AB (ref 12.0–15.0)
MCH: 24.6 pg — ABNORMAL LOW (ref 26.0–34.0)
MCHC: 31.4 g/dL (ref 30.0–36.0)
MCV: 78.1 fL (ref 78.0–100.0)
Platelets: 329 10*3/uL (ref 150–400)
RBC: 4.52 MIL/uL (ref 3.87–5.11)
RDW: 17.2 % — AB (ref 11.5–15.5)
WBC: 18.1 10*3/uL — ABNORMAL HIGH (ref 4.0–10.5)

## 2014-10-20 LAB — LEGIONELLA ANTIGEN, URINE

## 2014-10-20 MED ORDER — BENZONATATE 100 MG PO CAPS: 200.0000 mg | ORAL_CAPSULE | Freq: Three times a day (TID) | ORAL | Status: DC | PRN

## 2014-10-20 MED ORDER — METHYLPREDNISOLONE SODIUM SUCC 40 MG IJ SOLR
40.0000 mg | Freq: Two times a day (BID) | INTRAMUSCULAR | Status: DC
  Administered 2014-10-20 – 2014-10-23 (×6): 40 mg via INTRAVENOUS
  Filled 2014-10-20 (×8): qty 1

## 2014-10-20 NOTE — Progress Notes (Signed)
Inpatient Diabetes Program Recommendations  AACE/ADA: New Consensus Statement on Inpatient Glycemic Control (2013)  Target Ranges:  Prepandial:   less than 140 mg/dL      Peak postprandial:   less than 180 mg/dL (1-2 hours)      Critically ill patients:  140 - 180 mg/dL     Results for SAKITA, ALLIGOOD (MRN [id number]) as of 10/20/2014 13:21  Ref. Range 10/20/2014 07:25 10/20/2014 11:35  Glucose-Capillary Latest Range: 70-99 mg/dL 393 (H) 317 (H)    Results for JAELENE, DECH (MRN [id number]) as of 10/20/2014 13:21  Ref. Range 10/18/2014 23:07  Hemoglobin A1C Latest Range: <5.7 % 7.0 (H)     Note IV steroids are exacerbating patient's hyperglycemia.    Note that steroids decreased to 40 mg Q12 hours today.  Hopeful that CBGs will vastly improve as steroids are decreased.  Note plans to hopefully send patient home on Metformin and Amaryl as her A1c showed decent control for last 3 months despite the fact that patient has not been taking her oral DM meds for 2 months now.    Will follow Wyn Quaker RN, MSN, CDE Diabetes Coordinator Inpatient Diabetes Program Team Pager: [phone] (8a-10p)

## 2014-10-20 NOTE — Care Management Note (Addendum)
    Page 1 of 1   10/23/2014     3:45:18 PM CARE MANAGEMENT NOTE 10/23/2014  Patient:  [name] [name], [name] [name]   Account Number:  [id number]  Date Initiated:  10/20/2014  Documentation initiated by:  Dessa Phi  Subjective/Objective Assessment:   63 Y/O F ADMITTED W/ACUTE RESP FAILURE.     Action/Plan:   FROM HOME.HAS PCP,PHARMACY-CAN AFFORD MED CO PAY $3.HAS A CANE.   Anticipated DC Date:  10/23/2014   Anticipated DC Plan:  [place]  CM consult  Medication Assistance      Choice offered to / List presented to:             Status of service:  Completed, signed off Medicare Important Message given?   (If response is "NO", the following Medicare IM given date fields will be blank) Date Medicare IM given:   Medicare IM given by:   Date Additional Medicare IM given:   Additional Medicare IM given by:    Discharge Disposition:  HOME/SELF CARE  Per UR Regulation:  Reviewed for med. necessity/level of care/duration of stay  If discussed at Long Length of Stay Meetings, dates discussed:    Comments:  10/23/14 Elexus Barman RN,BSN NCM [phone] BENEFIT CHECKED FOR [facility]-$3 CO PAY, COVERED-FAMILY MED @ [facility].NO FURTHER D/C NEEDS.  10/20/14 Abel Ra RN,BSN NCM [phone] NO ANTICIPATED D/C NEEDS.

## 2014-10-20 NOTE — Progress Notes (Addendum)
[facility]                                                                              Patient Demographics  [name] [name], is a 63 y.o. female, DOB - September 30, 1951, TQ:[id number]  Admit date - 10/18/2014   Admitting Physician Allyne Gee, MD  Outpatient Primary MD for the patient is Marlou Sa, ERIC, MD  LOS - 2   Chief Complaint  Patient presents with  . Cough      HPI on 10/18/2014 [name] [name] is a 63 y.o. female presented with increased shortness of breath. Patient stated that she has been sick for about 9 days. She has had cough and congestion. She states there has been increased wheeze noted. Patient has had some sputum production. She stated that she has no chest pain noted at the time of admission but had tightness. Patient stated no abdominal pain though she stated on the left side has slight tenderness. She admitted to fevers and has some chills noted. She has also noted nausea and diarrhea. Patient denied any travel. Patient does smoke and still is actively smoking.   Assessment & Plan   Acute hypoxic respiratory failure secondary to bronchitis -Patient states her SOB has improved, however she continues to cough -Patient was noted to be hypoxic upon admission, SpO2 88% -Continue azithromycin, ceftriaxone, DuoNeb treatments, IV steroids -Will decrease solumedrol dose -will add on tessalon Tessalon Perles  Diabetes mellitus, type II -Hemoglobin A1c 7 -Patient is not having her medications for approximately 2 months -Diabetes complicated with steroid use -Will continue insulin sliding scale with CBG monitoring, Lantus 26 units daily as well as NovoLog 7 units pre-meal while in hospital -Patient will likely be discharged with her metformin and Amaryl  Leukocytosis  -Likely secondary to steroids -Will continue to monitor CBC  Hyperlipidemia -Continue statin  Hypertension -Continue Lasix, lisinopril  Chronic kidney disease, stage III -Creatinine currently  appears stable,continue to monitor BMP  Chronic diastolic heart failure -Echocardiogram in July 2013 shows an EF of 55%, with a grade 1 diastolic dysfunction -BNP was slightly elevated at 2000 (which may be complicated by CKD) -Patient does not appear to be overloaded, currently euvolemic and compensated -Continue lasix -Will continue to monitor daily weights, intake and output  Normocytic anemia -Hemoglobin appears to be stable, baseline approximately 10.5-11 -Continue to monitor CBC  Tobacco abuse -Smoking cessation counseling given   Code Status: Full  Family Communication: None at bedside  Disposition Plan: Admitted  Time Spent in minutes   25 minutes  Procedures  None  Consults   None  DVT Prophylaxis  heparin  Lab Results  Component Value Date   PLT 329 10/20/2014    Medications  Scheduled Meds: . aspirin EC  81 mg Oral Daily  . azithromycin  500 mg Intravenous Q24H  . brinzolamide  1 drop Both Eyes TID  . cefTRIAXone (ROCEPHIN)  IV  1 g Intravenous QHS  . folic acid  1 mg Oral Daily  . furosemide  40 mg Oral Daily  . glimepiride  4 mg Oral QAC breakfast  . heparin  5,000 Units Subcutaneous 3 times per day  . Influenza vac split quadrivalent  PF  0.5 mL Intramuscular Tomorrow-1000  . insulin aspart  0-20 Units Subcutaneous TID WC  . insulin aspart  6 Units Subcutaneous TID WC  . insulin glargine  26 Units Subcutaneous Daily  . ipratropium-albuterol  3 mL Nebulization TID  . latanoprost  1 drop Both Eyes QHS  . lisinopril  5 mg Oral Daily  . loratadine  10 mg Oral Daily  . metFORMIN  1,000 mg Oral BID WC  . methylPREDNISolone (SOLU-MEDROL) injection  60 mg Intravenous Q6H  . metoprolol tartrate  25 mg Oral BID  . mometasone-formoterol  2 puff Inhalation BID  . multivitamin with minerals  1 tablet Oral Daily  . pneumococcal 23 valent vaccine  0.5 mL Intramuscular Tomorrow-1000  . pravastatin  40 mg Oral QHS  . sodium chloride  3 mL Intravenous Q12H    . thiamine  100 mg Oral Daily  . traMADol  50 mg Oral TID   Continuous Infusions: . sodium chloride 50 mL/hr at 10/20/14 0138   PRN Meds:.albuterol, morphine injection, zolpidem  Antibiotics    Anti-infectives   Start     Dose/Rate Route Frequency Ordered Stop   10/18/14 2359  azithromycin (ZITHROMAX) 500 mg in dextrose 5 % 250 mL IVPB     500 mg 250 mL/hr over 60 Minutes Intravenous Every 24 hours 10/18/14 2306 10/25/14 2359   10/18/14 2330  cefTRIAXone (ROCEPHIN) 1 g in dextrose 5 % 50 mL IVPB     1 g 100 mL/hr over 30 Minutes Intravenous Daily at bedtime 10/18/14 2306 10/25/14 2159        Subjective:   [name] [name] seen and examined today.  Patient feels her breathing has improved since admission has not been able to rest due to constant cough.  She denies any chest pain, abdominal pain, nausea, vomiting, headache, dizziness at this time.  Objective:   Filed Vitals:   10/20/14 0755 10/20/14 0813 10/20/14 0955 10/20/14 1048  BP:  158/72 140/57 164/71  Pulse:  69 76   Temp:  97.9 F (36.6 C) 97.7 F (36.5 C)   TempSrc:  Oral Oral   Resp:  22 22   Height:      Weight:      SpO2: 95% 98% 96%     Wt Readings from Last 3 Encounters:  10/18/14 119.614 kg (263 lb 11.2 oz)  09/21/13 127.007 kg (280 lb)  03/23/13 123.288 kg (271 lb 12.8 oz)    No intake or output data in the 24 hours ending 10/20/14 1101  Exam  General: Well developed, well nourished, NAD, appears stated age  HEENT: NCAT, mucous membranes moist.   Cardiovascular: S1 S2 auscultated, no rubs, murmurs or gallops. Regular rate and rhythm.  Respiratory: Upper airway congestion, good air entry  Abdomen: Soft, obese, nontender, nondistended, + bowel sounds  Extremities: warm dry without cyanosis clubbing or edema  Neuro: AAOx3, no focal deficits  Psych: Appropriate mood and affect  Data Review   Micro Results Recent Results (from the past 240 hour(s))  CULTURE, BLOOD (ROUTINE X 2)      Status: None   Collection Time    10/18/14 11:07 PM      Result Value Ref Range Status   Specimen Description BLOOD LEFT ARM   Final   Special Requests BOTTLES DRAWN AEROBIC ONLY 5CC   Final   Culture  Setup Time     Final   Value: 10/19/2014 03:44     Performed at [facility]  Culture     Final   Value:        BLOOD CULTURE RECEIVED NO GROWTH TO DATE CULTURE WILL BE HELD FOR 5 DAYS BEFORE ISSUING A FINAL NEGATIVE REPORT     Performed at [facility]   Report Status PENDING   Incomplete  CULTURE, BLOOD (ROUTINE X 2)     Status: None   Collection Time    10/18/14 11:12 PM      Result Value Ref Range Status   Specimen Description BLOOD LEFT HAND   Final   Special Requests BOTTLES DRAWN AEROBIC AND ANAEROBIC 5CC   Final   Culture  Setup Time     Final   Value: 10/19/2014 03:44     Performed at [facility]   Culture     Final   Value:        BLOOD CULTURE RECEIVED NO GROWTH TO DATE CULTURE WILL BE HELD FOR 5 DAYS BEFORE ISSUING A FINAL NEGATIVE REPORT     Performed at [facility]   Report Status PENDING   Incomplete  MRSA PCR SCREENING     Status: None   Collection Time    10/19/14  6:45 AM      Result Value Ref Range Status   MRSA by PCR NEGATIVE  NEGATIVE Final   Comment:            The GeneXpert MRSA Assay (FDA     approved for NASAL specimens     only), is one component of a     comprehensive MRSA colonization     surveillance program. It is not     intended to diagnose MRSA     infection nor to guide or     monitor treatment for     MRSA infections.     Performed at [facility]    Radiology Reports Dg Chest 2 View  10/18/2014   CLINICAL DATA:  Productive cough and short of breath  EXAM: CHEST  2 VIEW  COMPARISON:  10/14/2013  FINDINGS: CABG changes. Interstitial edema is present with fluid in the major fissure. No significant layering pleural effusion. Negative for pneumonia.  IMPRESSION: Congestive heart failure with  interstitial edema.   Electronically Signed   By: Franchot Gallo M.D.   On: 10/18/2014 16:44    CBC  Recent Labs Lab 10/18/14 1637 10/18/14 2307 10/19/14 0508 10/20/14 0450  WBC 9.8 9.7 10.2 18.1*  HGB 11.8* 11.1* 10.8* 11.1*  HCT 35.9* 34.0* 33.8* 35.3*  PLT 323 285 288 329  MCV 77.0* 78.0 78.1 78.1  MCH 25.3* 25.5* 24.9* 24.6*  MCHC 32.9 32.6 32.0 31.4  RDW 17.0* 16.9* 17.0* 17.2*  LYMPHSABS 2.3  --   --   --   MONOABS 1.1*  --   --   --   EOSABS 0.1  --   --   --   BASOSABS 0.0  --   --   --     Chemistries   Recent Labs Lab 10/18/14 1637 10/18/14 2307 10/19/14 0508 10/20/14 0450  NA 139  --  135* 140  K 3.8  --  3.7 4.4  CL 99  --  98 103  CO2 26  --  22 23  GLUCOSE 271*  --  522* 412*  BUN 19  --  20 29*  CREATININE 1.52* 1.44* 1.35* 1.34*  CALCIUM 8.9  --  8.8 8.9  MG  --   --  2.2  --  AST  --   --  21  --   ALT  --   --  13  --   ALKPHOS  --   --  105  --   BILITOT  --   --  0.3  --    ------------------------------------------------------------------------------------------------------------------ estimated creatinine clearance is 58.5 ml/min (by C-G formula based on Cr of 1.34). ------------------------------------------------------------------------------------------------------------------  Recent Labs  10/18/14 2307  HGBA1C 7.0*   ------------------------------------------------------------------------------------------------------------------ No results found for this basename: CHOL, HDL, LDLCALC, TRIG, CHOLHDL, LDLDIRECT,  in the last 72 hours ------------------------------------------------------------------------------------------------------------------  Recent Labs  10/18/14 2330  TSH 0.963   ------------------------------------------------------------------------------------------------------------------ No results found for this basename: VITAMINB12, FOLATE, FERRITIN, TIBC, IRON, RETICCTPCT,  in the last 72 hours  Coagulation  profile No results found for this basename: INR, PROTIME,  in the last 168 hours  No results found for this basename: DDIMER,  in the last 72 hours  Cardiac Enzymes  Recent Labs Lab 10/18/14 2307 10/19/14 0508 10/19/14 1210  TROPONINI <0.30 <0.30 <0.30   ------------------------------------------------------------------------------------------------------------------ No components found with this basename: POCBNP,     Natane Heward D.O. on 10/20/2014 at 11:01 AM  Between 7am to 7pm - Pager - [phone]  After 7pm go to [email or web address] - password [email or web address]  And look for the night coverage person covering for me after hours  [facility] Office  [phone]

## 2014-10-21 DIAGNOSIS — H409 Unspecified glaucoma: Secondary | ICD-10-CM

## 2014-10-21 DIAGNOSIS — J069 Acute upper respiratory infection, unspecified: Secondary | ICD-10-CM

## 2014-10-21 LAB — BASIC METABOLIC PANEL
ANION GAP: 13 (ref 5–15)
BUN: 41 mg/dL — ABNORMAL HIGH (ref 6–23)
CO2: 25 mEq/L (ref 19–32)
Calcium: 9 mg/dL (ref 8.4–10.5)
Chloride: 102 mEq/L (ref 96–112)
Creatinine, Ser: 1.49 mg/dL — ABNORMAL HIGH (ref 0.50–1.10)
GFR calc Af Amer: 42 mL/min — ABNORMAL LOW (ref >90)
GFR, EST NON AFRICAN AMERICAN: 36 mL/min — AB (ref >90)
GLUCOSE: 244 mg/dL — AB (ref 70–99)
POTASSIUM: 4.6 meq/L (ref 3.7–5.3)
Sodium: 140 mEq/L (ref 137–147)

## 2014-10-21 LAB — GLUCOSE, CAPILLARY
GLUCOSE-CAPILLARY: 255 mg/dL — AB (ref 70–99)
GLUCOSE-CAPILLARY: 121 mg/dL — AB (ref 70–99)
Glucose-Capillary: 207 mg/dL — ABNORMAL HIGH (ref 70–99)
Glucose-Capillary: 159 mg/dL — ABNORMAL HIGH (ref 70–99)

## 2014-10-21 LAB — CBC
HCT: 34.9 % — ABNORMAL LOW (ref 36.0–46.0)
HEMOGLOBIN: 11 g/dL — AB (ref 12.0–15.0)
MCH: 24.8 pg — ABNORMAL LOW (ref 26.0–34.0)
MCHC: 31.5 g/dL (ref 30.0–36.0)
MCV: 78.6 fL (ref 78.0–100.0)
PLATELETS: 390 10*3/uL (ref 150–400)
RBC: 4.44 MIL/uL (ref 3.87–5.11)
RDW: 17.6 % — ABNORMAL HIGH (ref 11.5–15.5)
WBC: 19.4 10*3/uL — ABNORMAL HIGH (ref 4.0–10.5)

## 2014-10-21 MED ORDER — SODIUM CHLORIDE 0.9 % IV SOLN
INTRAVENOUS | Status: AC
  Administered 2014-10-21: 17:00:00 via INTRAVENOUS

## 2014-10-21 NOTE — Progress Notes (Addendum)
[facility]                                                                              Patient Demographics  [name] [name], is a 63 y.o. female, DOB - 1951-06-30, TQ:[id number]  Admit date - 10/18/2014   Admitting Physician Allyne Gee, MD  Outpatient Primary MD for the patient is Marlou Sa, ERIC, MD  LOS - 3   Chief Complaint  Patient presents with  . Cough      HPI on 10/18/2014 [name] [name] is a 63 y.o. female presented with increased shortness of breath. Patient stated that she has been sick for about 9 days. She has had cough and congestion. She states there has been increased wheeze noted. Patient has had some sputum production. She stated that she has no chest pain noted at the time of admission but had tightness. Patient stated no abdominal pain though she stated on the left side has slight tenderness. She admitted to fevers and has some chills noted. She has also noted nausea and diarrhea. Patient denied any travel. Patient does smoke and still is actively smoking.   Assessment & Plan   Acute hypoxic respiratory failure secondary to bronchitis/viral URI -Patient states her SOB is about the same, however she continues to cough -Patient was noted to be hypoxic upon admission, SpO2 88% -Continue azithromycin, ceftriaxone, DuoNeb treatments, IV steroids continue antitussives -Will decrease solumedrol dose to daily -Strep pneumonia and Legionella urine antigens negative  Diabetes mellitus, type II -Hemoglobin A1c 7 -Patient is not having her medications for approximately 2 months -Diabetes complicated with steroid use -Will continue insulin sliding scale with CBG monitoring, Lantus 26 units daily as well as NovoLog 7 units pre-meal while in hospital -Patient will likely be discharged with her metformin and Amaryl  Leukocytosis  -Likely secondary to steroids -Will continue to monitor CBC -UA negative, blood cultures show no growth, patient is  afebrile  Hyperlipidemia -Continue statin  Hypertension -Continue Lasix, lisinopril  Chronic kidney disease, stage III -Creatinine currently appears stable,continue to monitor BMP  Chronic diastolic heart failure -Echocardiogram in July 2013 shows an EF of 55%, with a grade 1 diastolic dysfunction -BNP was slightly elevated at 2000 (which may be complicated by CKD) -Patient does not appear to be overloaded, currently euvolemic and compensated -Continue lasix -Continue to monitor daily weights, intake and output  Normocytic anemia -Hemoglobin appears to be stable, baseline approximately 10.5-11 -Continue to monitor CBC  Tobacco abuse -Smoking cessation counseling given   Code Status: Full  Family Communication: None at bedside  Disposition Plan: Admitted, will likely discharge within the next one to 2 days pending patient's improvement.  Time Spent in minutes   25 minutes  Procedures  None  Consults   None  DVT Prophylaxis  heparin  Lab Results  Component Value Date   PLT 390 10/21/2014    Medications  Scheduled Meds: . aspirin EC  81 mg Oral Daily  . azithromycin  500 mg Intravenous Q24H  . brinzolamide  1 drop Both Eyes TID  . cefTRIAXone (ROCEPHIN)  IV  1 g Intravenous QHS  . folic acid  1 mg Oral Daily  . furosemide  40 mg Oral Daily  . glimepiride  4 mg Oral QAC breakfast  . heparin  5,000 Units Subcutaneous 3 times per day  . insulin aspart  0-20 Units Subcutaneous TID WC  . insulin aspart  6 Units Subcutaneous TID WC  . insulin glargine  26 Units Subcutaneous Daily  . ipratropium-albuterol  3 mL Nebulization TID  . latanoprost  1 drop Both Eyes QHS  . lisinopril  5 mg Oral Daily  . loratadine  10 mg Oral Daily  . metFORMIN  1,000 mg Oral BID WC  . methylPREDNISolone (SOLU-MEDROL) injection  40 mg Intravenous Q12H  . metoprolol tartrate  25 mg Oral BID  . mometasone-formoterol  2 puff Inhalation BID  . multivitamin with minerals  1 tablet Oral  Daily  . pravastatin  40 mg Oral QHS  . sodium chloride  3 mL Intravenous Q12H  . thiamine  100 mg Oral Daily  . traMADol  50 mg Oral TID   Continuous Infusions:   PRN Meds:.albuterol, benzonatate, morphine injection, zolpidem  Antibiotics    Anti-infectives   Start     Dose/Rate Route Frequency Ordered Stop   10/18/14 2359  azithromycin (ZITHROMAX) 500 mg in dextrose 5 % 250 mL IVPB     500 mg 250 mL/hr over 60 Minutes Intravenous Every 24 hours 10/18/14 2306 10/25/14 2359   10/18/14 2330  cefTRIAXone (ROCEPHIN) 1 g in dextrose 5 % 50 mL IVPB     1 g 100 mL/hr over 30 Minutes Intravenous Daily at bedtime 10/18/14 2306 10/25/14 2159        Subjective:   [name] [name] seen and examined today.  Patient feels her breathing has not returned to baseline. She still feels very short winded with activity. Patient continues to have cough. She denies any chest pain, abdominal pain, nausea, vomiting, headache, dizziness at this time.  Objective:   Filed Vitals:   10/20/14 2148 10/20/14 2158 10/21/14 0609 10/21/14 0755  BP:  167/75 160/75   Pulse:  95 68   Temp:  97.8 F (36.6 C) 97.7 F (36.5 C)   TempSrc:  Oral Oral   Resp:  20 18   Height:      Weight:      SpO2: 98% 99% 98% 97%    Wt Readings from Last 3 Encounters:  10/18/14 119.614 kg (263 lb 11.2 oz)  09/21/13 127.007 kg (280 lb)  03/23/13 123.288 kg (271 lb 12.8 oz)     Intake/Output Summary (Last 24 hours) at 10/21/14 1121 Last data filed at 10/20/14 2200  Gross per 24 hour  Intake    240 ml  Output    825 ml  Net   -585 ml    Exam  General: Well developed, well nourished, NAD, appears stated age  HEENT: NCAT, mucous membranes moist.   Cardiovascular: S1 S2 auscultated, no rubs, murmurs or gallops. Regular rate and rhythm.  Respiratory: Upper airway congestion, good air entry  Abdomen: Soft, obese, nontender, nondistended, + bowel sounds  Extremities: warm dry without cyanosis clubbing or  edema  Neuro: AAOx3, no focal deficits  Psych: Appropriate mood and affect  Data Review   Micro Results Recent Results (from the past 240 hour(s))  CULTURE, BLOOD (ROUTINE X 2)     Status: None   Collection Time    10/18/14 11:07 PM      Result Value Ref Range Status   Specimen Description BLOOD LEFT ARM   Final   Special Requests BOTTLES DRAWN AEROBIC  ONLY 5CC   Final   Culture  Setup Time     Final   Value: 10/19/2014 03:44     Performed at [facility]   Culture     Final   Value:        BLOOD CULTURE RECEIVED NO GROWTH TO DATE CULTURE WILL BE HELD FOR 5 DAYS BEFORE ISSUING A FINAL NEGATIVE REPORT     Performed at [facility]   Report Status PENDING   Incomplete  CULTURE, BLOOD (ROUTINE X 2)     Status: None   Collection Time    10/18/14 11:12 PM      Result Value Ref Range Status   Specimen Description BLOOD LEFT HAND   Final   Special Requests BOTTLES DRAWN AEROBIC AND ANAEROBIC 5CC   Final   Culture  Setup Time     Final   Value: 10/19/2014 03:44     Performed at [facility]   Culture     Final   Value:        BLOOD CULTURE RECEIVED NO GROWTH TO DATE CULTURE WILL BE HELD FOR 5 DAYS BEFORE ISSUING A FINAL NEGATIVE REPORT     Performed at [facility]   Report Status PENDING   Incomplete  MRSA PCR SCREENING     Status: None   Collection Time    10/19/14  6:45 AM      Result Value Ref Range Status   MRSA by PCR NEGATIVE  NEGATIVE Final   Comment:            The GeneXpert MRSA Assay (FDA     approved for NASAL specimens     only), is one component of a     comprehensive MRSA colonization     surveillance program. It is not     intended to diagnose MRSA     infection nor to guide or     monitor treatment for     MRSA infections.     Performed at [facility]    Radiology Reports Dg Chest 2 View  10/18/2014   CLINICAL DATA:  Productive cough and short of breath  EXAM: CHEST  2 VIEW  COMPARISON:  10/14/2013  FINDINGS:  CABG changes. Interstitial edema is present with fluid in the major fissure. No significant layering pleural effusion. Negative for pneumonia.  IMPRESSION: Congestive heart failure with interstitial edema.   Electronically Signed   By: Franchot Gallo M.D.   On: 10/18/2014 16:44    CBC  Recent Labs Lab 10/18/14 1637 10/18/14 2307 10/19/14 0508 10/20/14 0450 10/21/14 0520  WBC 9.8 9.7 10.2 18.1* 19.4*  HGB 11.8* 11.1* 10.8* 11.1* 11.0*  HCT 35.9* 34.0* 33.8* 35.3* 34.9*  PLT 323 285 288 329 390  MCV 77.0* 78.0 78.1 78.1 78.6  MCH 25.3* 25.5* 24.9* 24.6* 24.8*  MCHC 32.9 32.6 32.0 31.4 31.5  RDW 17.0* 16.9* 17.0* 17.2* 17.6*  LYMPHSABS 2.3  --   --   --   --   MONOABS 1.1*  --   --   --   --   EOSABS 0.1  --   --   --   --   BASOSABS 0.0  --   --   --   --     Chemistries   Recent Labs Lab 10/18/14 1637 10/18/14 2307 10/19/14 0508 10/20/14 0450 10/21/14 0520  NA 139  --  135* 140 140  K 3.8  --  3.7  4.4 4.6  CL 99  --  98 103 102  CO2 26  --  22 23 25   GLUCOSE 271*  --  522* 412* 244*  BUN 19  --  20 29* 41*  CREATININE 1.52* 1.44* 1.35* 1.34* 1.49*  CALCIUM 8.9  --  8.8 8.9 9.0  MG  --   --  2.2  --   --   AST  --   --  21  --   --   ALT  --   --  13  --   --   ALKPHOS  --   --  105  --   --   BILITOT  --   --  0.3  --   --    ------------------------------------------------------------------------------------------------------------------ estimated creatinine clearance is 52.6 ml/min (by C-G formula based on Cr of 1.49). ------------------------------------------------------------------------------------------------------------------  Recent Labs  10/18/14 2307  HGBA1C 7.0*   ------------------------------------------------------------------------------------------------------------------ No results found for this basename: CHOL, HDL, LDLCALC, TRIG, CHOLHDL, LDLDIRECT,  in the last 72  hours ------------------------------------------------------------------------------------------------------------------  Recent Labs  10/18/14 2330  TSH 0.963   ------------------------------------------------------------------------------------------------------------------ No results found for this basename: VITAMINB12, FOLATE, FERRITIN, TIBC, IRON, RETICCTPCT,  in the last 72 hours  Coagulation profile No results found for this basename: INR, PROTIME,  in the last 168 hours  No results found for this basename: DDIMER,  in the last 72 hours  Cardiac Enzymes  Recent Labs Lab 10/18/14 2307 10/19/14 0508 10/19/14 1210  TROPONINI <0.30 <0.30 <0.30   ------------------------------------------------------------------------------------------------------------------ No components found with this basename: POCBNP,     [name] [name] D.O. on 10/21/2014 at 11:21 AM  Between 7am to 7pm - Pager - [phone]  After 7pm go to [email or web address] - password [email or web address]  And look for the night coverage person covering for me after hours  [facility] Office  [phone]

## 2014-10-22 LAB — GLUCOSE, CAPILLARY
Glucose-Capillary: 267 mg/dL — ABNORMAL HIGH (ref 70–99)
Glucose-Capillary: 303 mg/dL — ABNORMAL HIGH (ref 70–99)
Glucose-Capillary: 219 mg/dL — ABNORMAL HIGH (ref 70–99)
Glucose-Capillary: 159 mg/dL — ABNORMAL HIGH (ref 70–99)

## 2014-10-22 LAB — BASIC METABOLIC PANEL
ANION GAP: 15 (ref 5–15)
BUN: 38 mg/dL — AB (ref 6–23)
CHLORIDE: 103 meq/L (ref 96–112)
CO2: 25 meq/L (ref 19–32)
Calcium: 8.6 mg/dL (ref 8.4–10.5)
Creatinine, Ser: 1.31 mg/dL — ABNORMAL HIGH (ref 0.50–1.10)
GFR, EST AFRICAN AMERICAN: 49 mL/min — AB (ref >90)
GFR, EST NON AFRICAN AMERICAN: 42 mL/min — AB (ref >90)
GLUCOSE: 324 mg/dL — AB (ref 70–99)
POTASSIUM: 4.8 meq/L (ref 3.7–5.3)
Sodium: 143 mEq/L (ref 137–147)

## 2014-10-22 NOTE — Progress Notes (Signed)
PHARMACIST - PHYSICIAN COMMUNICATION DR:  Ree Kida CONCERNING:  METFORMIN SAFE ADMINISTRATION POLICY  RECOMMENDATION: Metformin has been placed on DISCONTINUE (rejected order) STATUS and should be reordered only after any of the conditions below are ruled out.  Current safety recommendations include avoiding metformin for a minimum of 48 hours after the patient's exposure to intravenous contrast media.  DESCRIPTION:  The Pharmacy Committee has adopted a policy that restricts the use of metformin in hospitalized patients until all the contraindications to administration have been ruled out. Specific contraindications are: []  Serum creatinine ? 1.5 for males [x]  Serum creatinine ? 1.4 for females []  Shock, acute MI, sepsis, hypoxemia, dehydration []  Planned administration of intravenous iodinated contrast media []  Heart Failure patients with low EF []  Acute or chronic metabolic acidosis (including DKA)      Please call pharmacy with any questions.  Thanks, Reuel Boom, PharmD Pager: [phone] 10/22/2014, 11:45 AM

## 2014-10-22 NOTE — Progress Notes (Signed)
[facility]                                                                              Patient Demographics  [name] [name], is a 63 y.o. female, DOB - 11-16-1951, TQ:[id number]  Admit date - 10/18/2014   Admitting Physician Allyne Gee, MD  Outpatient Primary MD for the patient is Marlou Sa, ERIC, MD  LOS - 4   Chief Complaint  Patient presents with  . Cough      HPI on 10/18/2014 [name] [name] is a 63 y.o. female presented with increased shortness of breath. Patient stated that she has been sick for about 9 days. She has had cough and congestion. She states there has been increased wheeze noted. Patient has had some sputum production. She stated that she has no chest pain noted at the time of admission but had tightness. Patient stated no abdominal pain though she stated on the left side has slight tenderness. She admitted to fevers and has some chills noted. She has also noted nausea and diarrhea. Patient denied any travel. Patient does smoke and still is actively smoking.   Assessment & Plan   Acute hypoxic respiratory failure secondary to bronchitis/viral URI -Patient states her SOB is about the same, however she continues to cough -Patient was noted to be hypoxic upon admission, SpO2 88% -Continue azithromycin, ceftriaxone, DuoNeb treatments,steroids, antitussives -Will transition patient to PO prednisone -Strep pneumonia and Legionella urine antigens negative  Diabetes mellitus, type II -Hemoglobin A1c 7 -Patient is not having her medications for approximately 2 months -Diabetes complicated with steroid use -Will continue insulin sliding scale with CBG monitoring, Lantus 26 units daily as well as NovoLog 7 units pre-meal while in hospital -Patient will likely be discharged with her metformin and Amaryl (due to social situation)  Leukocytosis  -Likely secondary to steroids  -Will continue to monitor CBC -UA negative, blood cultures show no growth, patient is  afebrile  Hyperlipidemia -Continue statin  Hypertension -Continue Lasix, lisinopril  Chronic kidney disease, stage III -Creatinine currently appears stable,continue to monitor BMP  Chronic diastolic heart failure -Echocardiogram in July 2013 shows an EF of 55%, with a grade 1 diastolic dysfunction -BNP was slightly elevated at 2000 (which may be complicated by CKD) -Patient does not appear to be overloaded, currently euvolemic and compensated -Continue lasix -Continue to monitor daily weights, intake and output  Normocytic anemia -Hemoglobin appears to be stable, baseline approximately 10.5-11 -Continue to monitor CBC  Tobacco abuse -Smoking cessation counseling given   Code Status: Full  Family Communication: None at bedside  Disposition Plan: Admitted, will likely discharged on 10/26.    Time Spent in minutes   25 minutes  Procedures  None  Consults   None  DVT Prophylaxis  heparin  Lab Results  Component Value Date   PLT 390 10/21/2014    Medications  Scheduled Meds: . aspirin EC  81 mg Oral Daily  . azithromycin  500 mg Intravenous Q24H  . brinzolamide  1 drop Both Eyes TID  . cefTRIAXone (ROCEPHIN)  IV  1 g Intravenous QHS  . folic acid  1 mg Oral Daily  . furosemide  40 mg Oral Daily  .  glimepiride  4 mg Oral QAC breakfast  . heparin  5,000 Units Subcutaneous 3 times per day  . insulin aspart  0-20 Units Subcutaneous TID WC  . insulin aspart  6 Units Subcutaneous TID WC  . insulin glargine  26 Units Subcutaneous Daily  . ipratropium-albuterol  3 mL Nebulization TID  . latanoprost  1 drop Both Eyes QHS  . lisinopril  5 mg Oral Daily  . loratadine  10 mg Oral Daily  . metFORMIN  1,000 mg Oral BID WC  . methylPREDNISolone (SOLU-MEDROL) injection  40 mg Intravenous Q12H  . metoprolol tartrate  25 mg Oral BID  . mometasone-formoterol  2 puff Inhalation BID  . multivitamin with minerals  1 tablet Oral Daily  . pravastatin  40 mg Oral QHS  . sodium  chloride  3 mL Intravenous Q12H  . thiamine  100 mg Oral Daily  . traMADol  50 mg Oral TID   Continuous Infusions: . sodium chloride 50 mL/hr at 10/21/14 1642   PRN Meds:.albuterol, benzonatate, morphine injection, zolpidem  Antibiotics    Anti-infectives   Start     Dose/Rate Route Frequency Ordered Stop   10/18/14 2359  azithromycin (ZITHROMAX) 500 mg in dextrose 5 % 250 mL IVPB     500 mg 250 mL/hr over 60 Minutes Intravenous Every 24 hours 10/18/14 2306 10/25/14 2359   10/18/14 2330  cefTRIAXone (ROCEPHIN) 1 g in dextrose 5 % 50 mL IVPB     1 g 100 mL/hr over 30 Minutes Intravenous Daily at bedtime 10/18/14 2306 10/25/14 2159        Subjective:   [name] [name] seen and examined today.  Patient feels her breathing has mildly improved.  She continues to cough and have some shortness of breath with activity, however states it has improved. She denies any chest pain, abdominal pain, nausea, vomiting, headache, dizziness at this time.  Objective:   Filed Vitals:   10/21/14 1934 10/21/14 2139 10/22/14 0538 10/22/14 0842  BP:  159/72 161/95   Pulse:  84 71   Temp:  98.3 F (36.8 C) 97.8 F (36.6 C)   TempSrc:  Oral Oral   Resp:  18 18   Height:      Weight:      SpO2: 96% 98% 99% 96%    Wt Readings from Last 3 Encounters:  10/18/14 119.614 kg (263 lb 11.2 oz)  09/21/13 127.007 kg (280 lb)  03/23/13 123.288 kg (271 lb 12.8 oz)     Intake/Output Summary (Last 24 hours) at 10/22/14 0954 Last data filed at 10/21/14 1820  Gross per 24 hour  Intake    480 ml  Output      0 ml  Net    480 ml    Exam  General: Well developed, well nourished, NAD, appears stated age  71: NCAT, mucous membranes moist.   Cardiovascular: S1 S2 auscultated, no rubs, murmurs or gallops. Regular rate and rhythm.  Respiratory: Upper airway congestion, good air entry, interrupted by occasional cough.  Abdomen: Soft, obese, nontender, nondistended, + bowel sounds  Extremities: warm  dry without cyanosis clubbing or edema  Neuro: AAOx3, no focal deficits  Psych: Appropriate mood and affect  Data Review   Micro Results Recent Results (from the past 240 hour(s))  CULTURE, BLOOD (ROUTINE X 2)     Status: None   Collection Time    10/18/14 11:07 PM      Result Value Ref Range Status   Specimen Description BLOOD  LEFT ARM   Final   Special Requests BOTTLES DRAWN AEROBIC ONLY 5CC   Final   Culture  Setup Time     Final   Value: 10/19/2014 03:44     Performed at [facility]   Culture     Final   Value:        BLOOD CULTURE RECEIVED NO GROWTH TO DATE CULTURE WILL BE HELD FOR 5 DAYS BEFORE ISSUING A FINAL NEGATIVE REPORT     Performed at [facility]   Report Status PENDING   Incomplete  CULTURE, BLOOD (ROUTINE X 2)     Status: None   Collection Time    10/18/14 11:12 PM      Result Value Ref Range Status   Specimen Description BLOOD LEFT HAND   Final   Special Requests BOTTLES DRAWN AEROBIC AND ANAEROBIC 5CC   Final   Culture  Setup Time     Final   Value: 10/19/2014 03:44     Performed at [facility]   Culture     Final   Value:        BLOOD CULTURE RECEIVED NO GROWTH TO DATE CULTURE WILL BE HELD FOR 5 DAYS BEFORE ISSUING A FINAL NEGATIVE REPORT     Performed at [facility]   Report Status PENDING   Incomplete  MRSA PCR SCREENING     Status: None   Collection Time    10/19/14  6:45 AM      Result Value Ref Range Status   MRSA by PCR NEGATIVE  NEGATIVE Final   Comment:            The GeneXpert MRSA Assay (FDA     approved for NASAL specimens     only), is one component of a     comprehensive MRSA colonization     surveillance program. It is not     intended to diagnose MRSA     infection nor to guide or     monitor treatment for     MRSA infections.     Performed at [facility]    Radiology Reports Dg Chest 2 View  10/18/2014   CLINICAL DATA:  Productive cough and short of breath  EXAM: CHEST  2 VIEW   COMPARISON:  10/14/2013  FINDINGS: CABG changes. Interstitial edema is present with fluid in the major fissure. No significant layering pleural effusion. Negative for pneumonia.  IMPRESSION: Congestive heart failure with interstitial edema.   Electronically Signed   By: Franchot Gallo M.D.   On: 10/18/2014 16:44    CBC  Recent Labs Lab 10/18/14 1637 10/18/14 2307 10/19/14 0508 10/20/14 0450 10/21/14 0520  WBC 9.8 9.7 10.2 18.1* 19.4*  HGB 11.8* 11.1* 10.8* 11.1* 11.0*  HCT 35.9* 34.0* 33.8* 35.3* 34.9*  PLT 323 285 288 329 390  MCV 77.0* 78.0 78.1 78.1 78.6  MCH 25.3* 25.5* 24.9* 24.6* 24.8*  MCHC 32.9 32.6 32.0 31.4 31.5  RDW 17.0* 16.9* 17.0* 17.2* 17.6*  LYMPHSABS 2.3  --   --   --   --   MONOABS 1.1*  --   --   --   --   EOSABS 0.1  --   --   --   --   BASOSABS 0.0  --   --   --   --     Chemistries   Recent Labs Lab 10/18/14 1637 10/18/14 2307 10/19/14 0508 10/20/14 0450 10/21/14 0520  NA 139  --  135* 140 140  K 3.8  --  3.7 4.4 4.6  CL 99  --  98 103 102  CO2 26  --  22 23 25   GLUCOSE 271*  --  522* 412* 244*  BUN 19  --  20 29* 41*  CREATININE 1.52* 1.44* 1.35* 1.34* 1.49*  CALCIUM 8.9  --  8.8 8.9 9.0  MG  --   --  2.2  --   --   AST  --   --  21  --   --   ALT  --   --  13  --   --   ALKPHOS  --   --  105  --   --   BILITOT  --   --  0.3  --   --    ------------------------------------------------------------------------------------------------------------------ estimated creatinine clearance is 52.6 ml/min (by C-G formula based on Cr of 1.49). ------------------------------------------------------------------------------------------------------------------ No results found for this basename: HGBA1C,  in the last 72 hours ------------------------------------------------------------------------------------------------------------------ No results found for this basename: CHOL, HDL, LDLCALC, TRIG, CHOLHDL, LDLDIRECT,  in the last 72  hours ------------------------------------------------------------------------------------------------------------------ No results found for this basename: TSH, T4TOTAL, FREET3, T3FREE, THYROIDAB,  in the last 72 hours ------------------------------------------------------------------------------------------------------------------ No results found for this basename: VITAMINB12, FOLATE, FERRITIN, TIBC, IRON, RETICCTPCT,  in the last 72 hours  Coagulation profile No results found for this basename: INR, PROTIME,  in the last 168 hours  No results found for this basename: DDIMER,  in the last 72 hours  Cardiac Enzymes  Recent Labs Lab 10/18/14 2307 10/19/14 0508 10/19/14 1210  TROPONINI <0.30 <0.30 <0.30   ------------------------------------------------------------------------------------------------------------------ No components found with this basename: POCBNP,     [name] [name] D.O. on 10/22/2014 at 9:54 AM  Between 7am to 7pm - Pager - [phone]  After 7pm go to [email or web address] - password [email or web address]  And look for the night coverage person covering for me after hours  [facility] Office  [phone]

## 2014-10-23 LAB — CBC
HEMATOCRIT: 36 % (ref 36.0–46.0)
HEMOGLOBIN: 11.8 g/dL — AB (ref 12.0–15.0)
MCH: 25.4 pg — AB (ref 26.0–34.0)
MCHC: 32.8 g/dL (ref 30.0–36.0)
MCV: 77.6 fL — AB (ref 78.0–100.0)
Platelets: 438 10*3/uL — ABNORMAL HIGH (ref 150–400)
RBC: 4.64 MIL/uL (ref 3.87–5.11)
RDW: 17.9 % — ABNORMAL HIGH (ref 11.5–15.5)
WBC: 16.5 10*3/uL — ABNORMAL HIGH (ref 4.0–10.5)

## 2014-10-23 LAB — BASIC METABOLIC PANEL WITH GFR
Anion gap: 12 (ref 5–15)
BUN: 38 mg/dL — ABNORMAL HIGH (ref 6–23)
CO2: 26 meq/L (ref 19–32)
Calcium: 8.6 mg/dL (ref 8.4–10.5)
Chloride: 100 meq/L (ref 96–112)
Creatinine, Ser: 1.33 mg/dL — ABNORMAL HIGH (ref 0.50–1.10)
GFR calc Af Amer: 48 mL/min — ABNORMAL LOW
GFR calc non Af Amer: 42 mL/min — ABNORMAL LOW
Glucose, Bld: 225 mg/dL — ABNORMAL HIGH (ref 70–99)
Potassium: 5.3 meq/L (ref 3.7–5.3)
Sodium: 138 meq/L (ref 137–147)

## 2014-10-23 LAB — GLUCOSE, CAPILLARY
GLUCOSE-CAPILLARY: 217 mg/dL — AB (ref 70–99)
Glucose-Capillary: 155 mg/dL — ABNORMAL HIGH (ref 70–99)

## 2014-10-23 MED ORDER — LEVOFLOXACIN 750 MG PO TABS: 750.0000 mg | ORAL_TABLET | Freq: Every day | ORAL | Status: DC

## 2014-10-23 MED ORDER — PREDNISONE (PAK) 10 MG PO TABS: ORAL_TABLET | Freq: Every day | ORAL | Status: DC

## 2014-10-23 MED ORDER — BENZONATATE 200 MG PO CAPS: 200.0000 mg | ORAL_CAPSULE | Freq: Three times a day (TID) | ORAL | Status: DC | PRN

## 2014-10-23 NOTE — Discharge Summary (Signed)
Physician Discharge Summary  [name] [name] [id number] DOB: [birth year] DOA: 10/18/2014  PCP: Kevan Ny, MD  Admit date: 10/18/2014 Discharge date: 10/23/2014  Time spent: 45 minutes  Recommendations for Outpatient Follow-up:  Patient will be discharged home. She is to follow-up with her primary care physician within 1 week of discharge. Patient to continue taking her medications as prescribed. Patient should follow a heart healthy/carb modified diet. She may resume activity as tolerated.  Discharge Diagnoses:  Acute hypoxic respiratory failure secondary to bronchitis/viral URI Diabetes mellitus, type II Leukocytosis Hyperlipidemia Hypertension Chronic kidney disease, stage III Chronic diastolic heart failure Normocytic anemia Tobacco abuse  Discharge Condition: Stable  Diet recommendation: Heart health/carb modified  Filed Weights   10/18/14 2300  Weight: 119.614 kg (263 lb 11.2 oz)    History of present illness:  on 10/18/2014  [name] [name] is a 63 y.o. female presented with increased shortness of breath. Patient stated that she has been sick for about 9 days. She has had cough and congestion. She states there has been increased wheeze noted. Patient has had some sputum production. She stated that she has no chest pain noted at the time of admission but had tightness. Patient stated no abdominal pain though she stated on the left side has slight tenderness. She admitted to fevers and has some chills noted. She has also noted nausea and diarrhea. Patient denied any travel. Patient does smoke and still is actively smoking.  Hospital Course:  Acute hypoxic respiratory failure secondary to bronchitis/viral URI  -Improved -Patient was noted to be hypoxic upon admission, SpO2 88%  -Patient has no longer requiring supplemental oxygen -Initially placed on azithromycin, ceftriaxone, DuoNeb treatments,steroids, antitussives  -Patient will be discharged with Levaquin and  prednisone taper as well as antitussives -Strep pneumonia and Legionella urine antigens negative   Diabetes mellitus, type II  -Hemoglobin A1c 7  -Patient has not had her medications for approximately 2 months  -Diabetes complicated with steroid use  -During hospitalization, patient was placed on insulin sliding scale with CBG monitoring, Lantus 26 units daily as well as NovoLog 7 units pre-meal  -Patient will be discharged with her metformin and Amaryl (due to social situation)   Leukocytosis  -Improving, Likely secondary to steroids  -Will continue to monitor CBC  -UA negative, blood cultures show no growth, patient is afebrile   Hyperlipidemia  -Continue statin   Hypertension  -Continue Lasix, lisinopril   Chronic kidney disease, stage III  -Creatinine currently appears stable,continue to monitor BMP   Chronic diastolic heart failure  -Echocardiogram in July 2013 shows an EF of 55%, with a grade 1 diastolic dysfunction  -BNP was slightly elevated at 2000 (which may be complicated by CKD)  -Patient does not appear to be overloaded, currently euvolemic and compensated  -Continue lasix  -Monitored daily weights, intake and output   Normocytic anemia  -Hemoglobin appears to be stable, baseline approximately 10.5-11  -Continue to monitor CBC   Tobacco abuse  -Smoking cessation counseling given   Procedures:  None  Consultations:  None  Discharge Exam: Filed Vitals:   10/23/14 0438  BP: 169/87  Pulse: 62  Temp: 97.8 F (36.6 C)  Resp: 22   Exam  General: Well developed, well nourished, NAD, appears stated age  HEENT: NCAT, mucous membranes moist.  Cardiovascular: S1 S2 auscultated, Regular rate and rhythm.  Respiratory: Clear to auscultation Abdomen: Soft, obese, nontender, nondistended, + bowel sounds  Extremities: warm dry without cyanosis clubbing or edema  Neuro: AAOx3,  no focal deficits  Psych: Appropriate mood and affect  Discharge Instructions        Discharge Instructions   Discharge instructions    Complete by:  As directed   Patient will be discharged home. She is to follow-up with her primary care physician within 1 week of discharge. Patient to continue taking her medications as prescribed. Patient should follow a heart healthy/carb modified diet. She may resume activity as tolerated.            Medication List         aspirin 81 MG tablet  Take 1 tablet (81 mg total) by mouth daily.     benzonatate 200 MG capsule  Commonly known as:  TESSALON  Take 1 capsule (200 mg total) by mouth 3 (three) times daily as needed for cough.     brinzolamide 1 % ophthalmic suspension  Commonly known as:  AZOPT  Place 1 drop into both eyes 3 (three) times daily.     cetirizine 10 MG tablet  Commonly known as:  ZYRTEC  Take 10 mg by mouth daily as needed.     Fluticasone-Salmeterol 250-50 MCG/DOSE Aepb  Commonly known as:  ADVAIR  Inhale 1 puff into the lungs every 12 (twelve) hours.     furosemide 40 MG tablet  Commonly known as:  LASIX  Take 40 mg by mouth daily.     gabapentin 100 MG capsule  Commonly known as:  NEURONTIN  Take 100 mg by mouth daily.     glimepiride 4 MG tablet  Commonly known as:  AMARYL  Take 4 mg by mouth daily before breakfast.     latanoprost 0.005 % ophthalmic solution  Commonly known as:  XALATAN  Place 1 drop into both eyes at bedtime.     levofloxacin 750 MG tablet  Commonly known as:  LEVAQUIN  Take 1 tablet (750 mg total) by mouth daily.     lisinopril 5 MG tablet  Commonly known as:  PRINIVIL,ZESTRIL  Take 5 mg by mouth daily.     metFORMIN 500 MG tablet  Commonly known as:  GLUCOPHAGE  Take 1,000 mg by mouth 2 (two) times daily with a meal.     metoprolol tartrate 25 MG tablet  Commonly known as:  LOPRESSOR  Take 1 tablet (25 mg total) by mouth 2 (two) times daily.     pravastatin 40 MG tablet  Commonly known as:  PRAVACHOL  Take 40 mg by mouth at bedtime.     predniSONE 10  MG tablet  Commonly known as:  STERAPRED UNI-PAK  - Take by mouth daily. Prednisone dosing: Take  Prednisone 40mg  (4 tabs) x 3 days, then taper to 30mg  (3 tabs) x 3 days, then 20mg  (2 tabs) x 3days, then 10mg  (1 tab) x 3days, then OFF.  - Dispense:  30 tabs, refills: None     traMADol 50 MG tablet  Commonly known as:  ULTRAM  Take 50 mg by mouth 3 (three) times daily.     VITAMIN B-12 PO  Take 1 tablet by mouth daily.       Allergies  Allergen Reactions  . Naproxen     rash   Follow-up Information   Follow up with Marlou Sa, ERIC, MD. Schedule an appointment as soon as possible for a visit in 1 week. [facility] followup)    Specialty:  Internal Medicine   Contact information:   [facility] [address] [place] [address] [phone]  The results of significant diagnostics from this hospitalization (including imaging, microbiology, ancillary and laboratory) are listed below for reference.    Significant Diagnostic Studies: Dg Chest 2 View  10/18/2014   CLINICAL DATA:  Productive cough and short of breath  EXAM: CHEST  2 VIEW  COMPARISON:  10/14/2013  FINDINGS: CABG changes. Interstitial edema is present with fluid in the major fissure. No significant layering pleural effusion. Negative for pneumonia.  IMPRESSION: Congestive heart failure with interstitial edema.   Electronically Signed   By: Franchot Gallo M.D.   On: 10/18/2014 16:44    Microbiology: Recent Results (from the past 240 hour(s))  CULTURE, BLOOD (ROUTINE X 2)     Status: None   Collection Time    10/18/14 11:07 PM      Result Value Ref Range Status   Specimen Description BLOOD LEFT ARM   Final   Special Requests BOTTLES DRAWN AEROBIC ONLY 5CC   Final   Culture  Setup Time     Final   Value: 10/19/2014 03:44     Performed at [facility]   Culture     Final   Value:        BLOOD CULTURE RECEIVED NO GROWTH TO DATE CULTURE WILL BE HELD FOR 5 DAYS BEFORE ISSUING A  FINAL NEGATIVE REPORT     Performed at [facility]   Report Status PENDING   Incomplete  CULTURE, BLOOD (ROUTINE X 2)     Status: None   Collection Time    10/18/14 11:12 PM      Result Value Ref Range Status   Specimen Description BLOOD LEFT HAND   Final   Special Requests BOTTLES DRAWN AEROBIC AND ANAEROBIC 5CC   Final   Culture  Setup Time     Final   Value: 10/19/2014 03:44     Performed at [facility]   Culture     Final   Value:        BLOOD CULTURE RECEIVED NO GROWTH TO DATE CULTURE WILL BE HELD FOR 5 DAYS BEFORE ISSUING A FINAL NEGATIVE REPORT     Performed at [facility]   Report Status PENDING   Incomplete  MRSA PCR SCREENING     Status: None   Collection Time    10/19/14  6:45 AM      Result Value Ref Range Status   MRSA by PCR NEGATIVE  NEGATIVE Final   Comment:            The GeneXpert MRSA Assay (FDA     approved for NASAL specimens     only), is one component of a     comprehensive MRSA colonization     surveillance program. It is not     intended to diagnose MRSA     infection nor to guide or     monitor treatment for     MRSA infections.     Performed at [facility]: Basic Metabolic Panel:  Recent Labs Lab 10/19/14 0508 10/20/14 0450 10/21/14 0520 10/22/14 1053 10/23/14 0440  NA 135* 140 140 143 138  K 3.7 4.4 4.6 4.8 5.3  CL 98 103 102 103 100  CO2 22 23 25 25 26   GLUCOSE 522* 412* 244* 324* 225*  BUN 20 29* 41* 38* 38*  CREATININE 1.35* 1.34* 1.49* 1.31* 1.33*  CALCIUM 8.8 8.9 9.0 8.6 8.6  MG 2.2  --   --   --   --  Liver Function Tests:  Recent Labs Lab 10/19/14 0508  AST 21  ALT 13  ALKPHOS 105  BILITOT 0.3  PROT 7.4  ALBUMIN 2.5*   No results found for this basename: LIPASE, AMYLASE,  in the last 168 hours No results found for this basename: AMMONIA,  in the last 168 hours CBC:  Recent Labs Lab 10/18/14 1637 10/18/14 2307 10/19/14 0508 10/20/14 0450 10/21/14 0520  10/23/14 0440  WBC 9.8 9.7 10.2 18.1* 19.4* 16.5*  NEUTROABS 6.3  --   --   --   --   --   HGB 11.8* 11.1* 10.8* 11.1* 11.0* 11.8*  HCT 35.9* 34.0* 33.8* 35.3* 34.9* 36.0  MCV 77.0* 78.0 78.1 78.1 78.6 77.6*  PLT 323 285 288 329 390 438*   Cardiac Enzymes:  Recent Labs Lab 10/18/14 1637 10/18/14 2307 10/19/14 0508 10/19/14 1210  TROPONINI <0.30 <0.30 <0.30 <0.30   BNP: BNP (last 3 results)  Recent Labs  10/18/14 1637  PROBNP 2288.0*   CBG:  Recent Labs Lab 10/22/14 0702 10/22/14 1122 10/22/14 1637 10/22/14 2048 10/23/14 0724  GLUCAP 267* 303* 219* 159* 217*       Signed:  Darrold Bezek  [facility] 10/23/2014, 10:24 AM

## 2014-10-23 NOTE — Plan of Care (Signed)
Problem: Phase II Progression Outcomes Goal: CBGs less than or equal to 200 Outcome: Adequate for Discharge Pt receiving steroid therapy

## 2014-10-23 NOTE — Progress Notes (Signed)
Discharge instructions and medications reviewed with patient. Patient verbalizes understanding. Patient has no questions at this time. Patient confirms she has all personal belongings in her possession. Patient discharged home.

## 2014-10-23 NOTE — Plan of Care (Signed)
Problem: Phase I Progression Outcomes Goal: CBGs steadily decreasing with treatment Outcome: Adequate for Discharge Pt currently on steroid

## 2014-10-23 NOTE — Progress Notes (Signed)
Clinical Social Work  Patient ready to DC today. CSW provided bus pass and directions to get to [facility]. Patient thanked CSW for bus pass and reports no further needs. CSW is signing off but available if needed.  [place], [place] [phone]

## 2014-10-23 NOTE — Discharge Instructions (Signed)
Acute Bronchitis Bronchitis is when the airways that extend from the windpipe into the lungs get red, puffy, and painful (inflamed). Bronchitis often causes thick spit (mucus) to develop. This leads to a cough. A cough is the most common symptom of bronchitis. In acute bronchitis, the condition usually begins suddenly and goes away over time (usually in 2 weeks). Smoking, allergies, and asthma can make bronchitis worse. Repeated episodes of bronchitis may cause more lung problems. HOME CARE  Rest.  Drink enough fluids to keep your pee (urine) clear or pale yellow (unless you need to limit fluids as told by your doctor).  Only take over-the-counter or prescription medicines as told by your doctor.  Avoid smoking and secondhand smoke. These can make bronchitis worse. If you are a smoker, think about using nicotine gum or skin patches. Quitting smoking will help your lungs heal faster.  Reduce the chance of getting bronchitis again by:  Washing your hands often.  Avoiding people with cold symptoms.  Trying not to touch your hands to your mouth, nose, or eyes.  Follow up with your doctor as told. GET HELP IF: Your symptoms do not improve after 1 week of treatment. Symptoms include:  Cough.  Fever.  Coughing up thick spit.  Body aches.  Chest congestion.  Chills.  Shortness of breath.  Sore throat. GET HELP RIGHT AWAY IF:   You have an increased fever.  You have chills.  You have severe shortness of breath.  You have bloody thick spit (sputum).  You throw up (vomit) often.  You lose too much body fluid (dehydration).  You have a severe headache.  You faint. MAKE SURE YOU:   Understand these instructions.  Will watch your condition.  Will get help right away if you are not doing well or get worse. Document Released: 06/02/2008 Document Revised: 08/17/2013 Document Reviewed: 06/07/2013 [facility] Patient Information 2015 [place], [place]. This information is not  intended to replace advice given to you by your health care provider. Make sure you discuss any questions you have with your health care provider. Smoking Cessation Quitting smoking is important to your health and has many advantages. However, it is not always easy to quit since nicotine is a very addictive drug. Oftentimes, people try 3 times or more before being able to quit. This document explains the best ways for you to prepare to quit smoking. Quitting takes hard work and a lot of effort, but you can do it. ADVANTAGES OF QUITTING SMOKING  You will live longer, feel better, and live better.  Your body will feel the impact of quitting smoking almost immediately.  Within 20 minutes, blood pressure decreases. Your pulse returns to its normal level.  After 8 hours, carbon monoxide levels in the blood return to normal. Your oxygen level increases.  After 24 hours, the chance of having a heart attack starts to decrease. Your breath, hair, and body stop smelling like smoke.  After 48 hours, damaged nerve endings begin to recover. Your sense of taste and smell improve.  After 72 hours, the body is virtually free of nicotine. Your bronchial tubes relax and breathing becomes easier.  After 2 to 12 weeks, lungs can hold more air. Exercise becomes easier and circulation improves.  The risk of having a heart attack, stroke, cancer, or lung disease is greatly reduced.  After 1 year, the risk of coronary heart disease is cut in half.  After 5 years, the risk of stroke falls to the same as a nonsmoker.  After 10 years, the risk of lung cancer is cut in half and the risk of other cancers decreases significantly.  After 15 years, the risk of coronary heart disease drops, usually to the level of a nonsmoker.  If you are pregnant, quitting smoking will improve your chances of having a healthy baby.  The people you live with, especially any children, will be healthier.  You will have extra money  to spend on things other than cigarettes. QUESTIONS TO THINK ABOUT BEFORE ATTEMPTING TO QUIT You may want to talk about your answers with your health care provider.  Why do you want to quit?  If you tried to quit in the past, what helped and what did not?  What will be the most difficult situations for you after you quit? How will you plan to handle them?  Who can help you through the tough times? Your family? Friends? A health care provider?  What pleasures do you get from smoking? What ways can you still get pleasure if you quit? Here are some questions to ask your health care provider:  How can you help me to be successful at quitting?  What medicine do you think would be best for me and how should I take it?  What should I do if I need more help?  What is smoking withdrawal like? How can I get information on withdrawal? GET READY  Set a quit date.  Change your environment by getting rid of all cigarettes, ashtrays, matches, and lighters in your home, car, or work. Do not let people smoke in your home.  Review your past attempts to quit. Think about what worked and what did not. GET SUPPORT AND ENCOURAGEMENT You have a better chance of being successful if you have help. You can get support in many ways.  Tell your family, friends, and coworkers that you are going to quit and need their support. Ask them not to smoke around you.  Get individual, group, or telephone counseling and support. Programs are available at [employer] and health centers. Call your local health department for information about programs in your area.  Spiritual beliefs and practices may help some smokers quit.  Download a "quit meter" on your computer to keep track of quit statistics, such as how long you have gone without smoking, cigarettes not smoked, and money saved.  Get a self-help book about quitting smoking and staying off tobacco. Dimmitt yourself from  urges to smoke. Talk to someone, go for a walk, or occupy your time with a task.  Change your normal routine. Take a different route to work. Drink tea instead of coffee. Eat breakfast in a different place.  Reduce your stress. Take a hot bath, exercise, or read a book.  Plan something enjoyable to do every day. Reward yourself for not smoking.  Explore interactive web-based programs that specialize in helping you quit. GET MEDICINE AND USE IT CORRECTLY Medicines can help you stop smoking and decrease the urge to smoke. Combining medicine with the above behavioral methods and support can greatly increase your chances of successfully quitting smoking.  Nicotine replacement therapy helps deliver nicotine to your body without the negative effects and risks of smoking. Nicotine replacement therapy includes nicotine gum, lozenges, inhalers, nasal sprays, and skin patches. Some may be available over-the-counter and others require a prescription.  Antidepressant medicine helps people abstain from smoking, but how this works is unknown. This medicine is available by prescription.  Nicotinic receptor partial agonist medicine simulates the effect of nicotine in your brain. This medicine is available by prescription. Ask your health care provider for advice about which medicines to use and how to use them based on your health history. Your health care provider will tell you what side effects to look out for if you choose to be on a medicine or therapy. Carefully read the information on the package. Do not use any other product containing nicotine while using a nicotine replacement product.  RELAPSE OR DIFFICULT SITUATIONS Most relapses occur within the first 3 months after quitting. Do not be discouraged if you start smoking again. Remember, most people try several times before finally quitting. You may have symptoms of withdrawal because your body is used to nicotine. You may crave cigarettes, be irritable,  feel very hungry, cough often, get headaches, or have difficulty concentrating. The withdrawal symptoms are only temporary. They are strongest when you first quit, but they will go away within 10-14 days. To reduce the chances of relapse, try to:  Avoid drinking alcohol. Drinking lowers your chances of successfully quitting.  Reduce the amount of caffeine you consume. Once you quit smoking, the amount of caffeine in your body increases and can give you symptoms, such as a rapid heartbeat, sweating, and anxiety.  Avoid smokers because they can make you want to smoke.  Do not let weight gain distract you. Many smokers will gain weight when they quit, usually less than 10 pounds. Eat a healthy diet and stay active. You can always lose the weight gained after you quit.  Find ways to improve your mood other than smoking. FOR MORE INFORMATION  www.smokefree.gov  Document Released: 12/09/2001 Document Revised: 05/01/2014 Document Reviewed: 03/25/2012 [facility] Patient Information 2015 [place], [place]. This information is not intended to replace advice given to you by your health care provider. Make sure you discuss any questions you have with your health care provider.

## 2014-10-25 LAB — CULTURE, BLOOD (ROUTINE X 2)
Culture: NO GROWTH
Culture: NO GROWTH

## 2014-12-07 ENCOUNTER — Encounter (HOSPITAL_COMMUNITY): Payer: Self-pay | Admitting: Cardiology

## 2015-01-18 ENCOUNTER — Emergency Department (HOSPITAL_COMMUNITY)
Admission: EM | Admit: 2015-01-18 | Discharge: 2015-01-18 | Disposition: A | Discharge status: Home / Self Care | Payer: Medicaid Other | Attending: Emergency Medicine | Admitting: Emergency Medicine

## 2015-01-18 ENCOUNTER — Emergency Department (HOSPITAL_COMMUNITY): Payer: Medicaid Other

## 2015-01-18 ENCOUNTER — Encounter (HOSPITAL_COMMUNITY): Payer: Self-pay | Admitting: Emergency Medicine

## 2015-01-18 DIAGNOSIS — Z951 Presence of aortocoronary bypass graft: Secondary | ICD-10-CM | POA: Diagnosis not present

## 2015-01-18 DIAGNOSIS — L03113 Cellulitis of right upper limb: Secondary | ICD-10-CM | POA: Diagnosis not present

## 2015-01-18 DIAGNOSIS — Z8673 Personal history of transient ischemic attack (TIA), and cerebral infarction without residual deficits: Secondary | ICD-10-CM | POA: Insufficient documentation

## 2015-01-18 DIAGNOSIS — L02519 Cutaneous abscess of unspecified hand: Secondary | ICD-10-CM

## 2015-01-18 DIAGNOSIS — Y939 Activity, unspecified: Secondary | ICD-10-CM | POA: Insufficient documentation

## 2015-01-18 DIAGNOSIS — Z9861 Coronary angioplasty status: Secondary | ICD-10-CM | POA: Diagnosis not present

## 2015-01-18 DIAGNOSIS — L02511 Cutaneous abscess of right hand: Secondary | ICD-10-CM | POA: Diagnosis not present

## 2015-01-18 DIAGNOSIS — X58XXXA Exposure to other specified factors, initial encounter: Secondary | ICD-10-CM | POA: Insufficient documentation

## 2015-01-18 DIAGNOSIS — M795 Residual foreign body in soft tissue: Secondary | ICD-10-CM

## 2015-01-18 DIAGNOSIS — M1990 Unspecified osteoarthritis, unspecified site: Secondary | ICD-10-CM | POA: Diagnosis not present

## 2015-01-18 DIAGNOSIS — I25119 Atherosclerotic heart disease of native coronary artery with unspecified angina pectoris: Secondary | ICD-10-CM | POA: Diagnosis not present

## 2015-01-18 DIAGNOSIS — L03119 Cellulitis of unspecified part of limb: Secondary | ICD-10-CM

## 2015-01-18 DIAGNOSIS — Y998 Other external cause status: Secondary | ICD-10-CM | POA: Insufficient documentation

## 2015-01-18 DIAGNOSIS — Z7982 Long term (current) use of aspirin: Secondary | ICD-10-CM | POA: Insufficient documentation

## 2015-01-18 DIAGNOSIS — I10 Essential (primary) hypertension: Secondary | ICD-10-CM | POA: Diagnosis not present

## 2015-01-18 DIAGNOSIS — Z7951 Long term (current) use of inhaled steroids: Secondary | ICD-10-CM | POA: Diagnosis not present

## 2015-01-18 DIAGNOSIS — E119 Type 2 diabetes mellitus without complications: Secondary | ICD-10-CM | POA: Insufficient documentation

## 2015-01-18 DIAGNOSIS — S60551A Superficial foreign body of right hand, initial encounter: Secondary | ICD-10-CM | POA: Diagnosis not present

## 2015-01-18 DIAGNOSIS — Z9889 Other specified postprocedural states: Secondary | ICD-10-CM | POA: Insufficient documentation

## 2015-01-18 DIAGNOSIS — Z792 Long term (current) use of antibiotics: Secondary | ICD-10-CM | POA: Insufficient documentation

## 2015-01-18 DIAGNOSIS — H409 Unspecified glaucoma: Secondary | ICD-10-CM | POA: Insufficient documentation

## 2015-01-18 DIAGNOSIS — E785 Hyperlipidemia, unspecified: Secondary | ICD-10-CM | POA: Diagnosis not present

## 2015-01-18 DIAGNOSIS — Z79899 Other long term (current) drug therapy: Secondary | ICD-10-CM | POA: Insufficient documentation

## 2015-01-18 DIAGNOSIS — M79641 Pain in right hand: Secondary | ICD-10-CM | POA: Diagnosis present

## 2015-01-18 DIAGNOSIS — I252 Old myocardial infarction: Secondary | ICD-10-CM | POA: Diagnosis not present

## 2015-01-18 DIAGNOSIS — Y929 Unspecified place or not applicable: Secondary | ICD-10-CM | POA: Insufficient documentation

## 2015-01-18 MED ORDER — SULFAMETHOXAZOLE-TRIMETHOPRIM 800-160 MG PO TABS: 1.0000 | ORAL_TABLET | Freq: Two times a day (BID) | ORAL | Status: DC

## 2015-01-18 MED ORDER — HYDROCODONE-ACETAMINOPHEN 5-325 MG PO TABS
1.0000 | ORAL_TABLET | Freq: Once | ORAL | Status: AC
  Administered 2015-01-18: 1 via ORAL
  Filled 2015-01-18: qty 1

## 2015-01-18 MED ORDER — LIDOCAINE HCL (PF) 1 % IJ SOLN
5.0000 mL | Freq: Once | INTRAMUSCULAR | Status: AC
  Administered 2015-01-18: 5 mL
  Filled 2015-01-18: qty 5

## 2015-01-18 MED ORDER — HYDROCODONE-ACETAMINOPHEN 5-325 MG PO TABS: 1.0000 | ORAL_TABLET | ORAL | Status: DC | PRN

## 2015-01-18 NOTE — ED Notes (Signed)
Declined W/C at D/C and was escorted to lobby by RN. 

## 2015-01-18 NOTE — ED Notes (Addendum)
Started yesterday with right hand pain and swelling. Has white lesion surrounded by redness. No known injury.

## 2015-01-18 NOTE — Discharge Instructions (Signed)
Follow the directions provided.   Be sure to return to the emergency department, fast track area, for a follow-up tomorrow after 4 PM. Take the Bactrim twice a day as directed for the infection in your hand. You may use the pain pill as needed for pain. Don't hesitate to return sooner however for any new, worsening, or concerning symptoms.     SEEK MEDICAL CARE IF:  You notice red streaks coming from the infected area.  Your red area gets larger or turns dark in color.  Your bone or joint underneath the infected area becomes painful after the skin has healed.  Your infection returns in the same area or another area.  You notice a swollen bump in the infected area.  You develop new symptoms.  You have a fever. SEEK IMMEDIATE MEDICAL CARE IF:  You feel very sleepy.  You develop vomiting or diarrhea.  You have a general ill feeling (malaise) with muscle aches and pains.

## 2015-01-18 NOTE — ED Provider Notes (Signed)
CSN: [id number]     Arrival date & time 01/18/15  0935 History  This chart was scribed for non-physician practitioner, Britt Bottom, NP-C, working with Hoy Morn, MD by Ladene Artist, ED Scribe. This patient was seen in room TR08C/TR08C and the patient's care was started at 9:49 AM.   Chief Complaint  Patient presents with  . Hand Pain   The history is provided by the patient. No language interpreter was used.    HPI Comments: [name] [name] is a 64 y.o. female, with a h/o HTN, DM, hyperlipidemia, CVA, MI, who presents to the Emergency Department complaining of constant, gradually worsening R hand pain onset yesterday morning. Pt currently rates her pain 8/10, worse with movement. She reports associated swelling to her R hand onset yesterday morning. She denies injury. No h/o similar symptoms.   PCP: Kevan Ny, MD  Past Medical History  Diagnosis Date  . Hypertension   . Diabetes mellitus     diagnosed in 2008  . Glaucoma   . Hyperlipidemia   . CVA (cerebral infarction)     right internal capsule stroke in 12/2006  . Left-sided sensory deficit present   . Cocaine abuse     crack cocaine heavily until 2008 then sporatic use since then  . Tobacco abuse   . Thyroid nodule     FNA in [id number] showed follicular cells but not definate neoplasm  . Coronary artery disease   . Myocardial infarction   . Anginal pain   . Stroke   . 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 Coronary artery bypass graft  07/09/2012    Procedure: CORONARY ARTERY BYPASS GRAFTING (CABG);  Surgeon: Ivin Poot, MD;  Location: [place];  Service: Open Heart Surgery;  Laterality: N/A;  . Cardiac catheterization    . Sternal wound debridement  08/17/2012    Procedure: STERNAL WOUND DEBRIDEMENT;  Surgeon: Ivin Poot, MD;  Location: [place];  Service: Thoracic;  Laterality: N/A;  wound vac application  . Sternal wound debridement  08/24/2012     Procedure: STERNAL WOUND DEBRIDEMENT;  Surgeon: Ivin Poot, MD;  Location: [place];  Service: Thoracic;  Laterality: N/A;  . Sternal wound debridement  09/01/2012    Procedure: STERNAL WOUND DEBRIDEMENT;  Surgeon: Ivin Poot, MD;  Location: [place];  Service: Thoracic;  Laterality: N/A;  . Sternal wound debridement  09/20/2012    Procedure: STERNAL WOUND DEBRIDEMENT;  Surgeon: Ivin Poot, MD;  Location: [facility];  Service: Thoracic;  Laterality: N/A;  wound vac change  . Left heart catheterization with coronary angiogram N/A 06/29/2012    Procedure: LEFT HEART CATHETERIZATION WITH CORONARY ANGIOGRAM;  Surgeon: Peter M Martinique, MD;  Location: [facility] CATH LAB;  Service: Cardiovascular;  Laterality: N/A;   Family History  Problem Relation Age of Onset  . Other      no known family CAD   History  Substance Use Topics  . Smoking status: Current Every Day Smoker -- 0.25 packs/day  . Smokeless tobacco: Not on file  . Alcohol Use: No   OB History    No data available     Review of Systems  Musculoskeletal: Positive for joint swelling and arthralgias.  All other systems reviewed and are negative.  Allergies  Naproxen  Home Medications   Prior to Admission medications   Medication Sig Start Date End Date Taking? Authorizing Provider  aspirin 81 MG tablet  Take 1 tablet (81 mg total) by mouth daily. 09/21/13   Peter M Martinique, MD  benzonatate (TESSALON) 200 MG capsule Take 1 capsule (200 mg total) by mouth 3 (three) times daily as needed for cough. 10/23/14   [name], DO  brinzolamide (AZOPT) 1 % ophthalmic suspension Place 1 drop into both eyes 3 (three) times daily.    Historical Provider, MD  cetirizine (ZYRTEC) 10 MG tablet Take 10 mg by mouth daily as needed.     Historical Provider, MD  Cyanocobalamin (VITAMIN B-12 PO) Take 1 tablet by mouth daily.    Historical Provider, MD  Fluticasone-Salmeterol (ADVAIR) 250-50 MCG/DOSE AEPB Inhale 1 puff into the lungs every 12 (twelve)  hours.    Historical Provider, MD  furosemide (LASIX) 40 MG tablet Take 40 mg by mouth daily.    Historical Provider, MD  gabapentin (NEURONTIN) 100 MG capsule Take 100 mg by mouth daily.    Historical Provider, MD  glimepiride (AMARYL) 4 MG tablet Take 4 mg by mouth daily before breakfast.    Historical Provider, MD  latanoprost (XALATAN) 0.005 % ophthalmic solution Place 1 drop into both eyes at bedtime.    Historical Provider, MD  levofloxacin (LEVAQUIN) 750 MG tablet Take 1 tablet (750 mg total) by mouth daily. 10/23/14   [name], DO  lisinopril (PRINIVIL,ZESTRIL) 5 MG tablet Take 5 mg by mouth daily.    Historical Provider, MD  metFORMIN (GLUCOPHAGE) 500 MG tablet Take 1,000 mg by mouth 2 (two) times daily with a meal.    Historical Provider, MD  metoprolol tartrate (LOPRESSOR) 25 MG tablet Take 1 tablet (25 mg total) by mouth 2 (two) times daily. 08/22/12   [name], PA-C  pravastatin (PRAVACHOL) 40 MG tablet Take 40 mg by mouth at bedtime.    Historical Provider, MD  predniSONE (STERAPRED UNI-PAK) 10 MG tablet Take by mouth daily. Prednisone dosing: Take  Prednisone 40mg  (4 tabs) x 3 days, then taper to 30mg  (3 tabs) x 3 days, then 20mg  (2 tabs) x 3days, then 10mg  (1 tab) x 3days, then OFF. Dispense:  30 tabs, refills: None 10/23/14   [name], DO  traMADol (ULTRAM) 50 MG tablet Take 50 mg by mouth 3 (three) times daily.     Historical Provider, MD   Triage Vitals: BP 172/82 mmHg  Pulse 74  Temp(Src) 98.3 F (36.8 C) (Oral)  Ht 5' 8.5" (1.74 m)  Wt 268 lb (121.564 kg)  BMI 40.15 kg/m2  SpO2 100% Physical Exam  Constitutional: She is oriented to person, place, and time. She appears well-developed and well-nourished. No distress.  HENT:  Head: Normocephalic and atraumatic.  Eyes: Conjunctivae and EOM are normal.  Neck: Neck supple.  Cardiovascular: Normal rate.   Pulmonary/Chest: Effort normal.  Musculoskeletal: Normal range of motion.  Swelling to all 5  digits of R hand. Soft tissue swelling noted to dorsum of hand and plantar aspect worse over the thenar eminence. 5 mm circular lesion at base of palm. 5/5 strentgth with flexion and extension of all 5 fingers and wrist.  Neurological: She is alert and oriented to person, place, and time.  Skin: Skin is warm and dry.  Psychiatric: She has a normal mood and affect. Her behavior is normal.  Nursing note and vitals reviewed.  ED Course  Procedures (including critical care time) DIAGNOSTIC STUDIES: Oxygen Saturation is 100% on RA, normal by my interpretation.    COORDINATION OF CARE: 9:54 AM-Discussed treatment plan which includes XR and Vicodin  with pt at bedside and pt agreed to plan.   Labs Review Labs Reviewed - No data to display  Imaging Review Dg Hand Complete Right  01/18/2015   CLINICAL DATA:  Soft tissue swelling without definitive injury, initial encounter  EXAM: RIGHT HAND - COMPLETE 3+ VIEW  COMPARISON:  None.  FINDINGS: Generalized soft tissue swelling is noted. No acute fracture or dislocation is seen. Very mild degenerative changes are noted in the interphalangeal joints.  IMPRESSION: Generalized soft tissue swelling without acute bony abnormality.   Electronically Signed   By: Inez Catalina M.D.   On: 01/18/2015 10:52    EKG Interpretation None      MDM   Final diagnoses:  Foreign body (FB) in soft tissue  Cellulitis and abscess of hand   64 yo with right hand swelling and redness, unknown injury. Normal strength, range of motion limited in making a closed fist due to swelling. Discussed case with Dr. Venora Maples.  Possible abscess on hand incised without drainage noted.  Xray negative for fracture or foreign body. Will treat with abx for cellulitis but instructed pt to return to ED tomorrow during my shift for re-eval.  Pt verbalizes understanding and is in agreement with plan.  I personally performed the services described in this documentation, which was scribed in my  presence. The recorded information has been reviewed and is accurate.  Filed Vitals:   01/18/15 0945 01/18/15 1108  BP: 172/82 148/61  Pulse: 74 63  Temp: 98.3 F (36.8 C) 97.9 F (36.6 C)  TempSrc: Oral Oral  Resp:  18  Height: 5' 8.5" (1.74 m)   Weight: 268 lb (121.564 kg)   SpO2: 100% 98%   Meds given in ED:  Medications  lidocaine (PF) (XYLOCAINE) 1 % injection 5 mL (5 mLs Infiltration Given 01/18/15 1020)  HYDROcodone-acetaminophen (NORCO/VICODIN) 5-325 MG per tablet 1 tablet (1 tablet Oral Given 01/18/15 1026)    Discharge Medication List as of 01/18/2015 11:02 AM    START taking these medications   Details  HYDROcodone-acetaminophen (NORCO/VICODIN) 5-325 MG per tablet Take 1 tablet by mouth every 4 (four) hours as needed., Starting 01/18/2015, Until Discontinued, Print    sulfamethoxazole-trimethoprim (BACTRIM DS,SEPTRA DS) 800-160 MG per tablet Take 1 tablet by mouth 2 (two) times daily., Starting 01/18/2015, Until Thu 01/25/15, Print         Britt Bottom, NP 01/19/15 [place], MD 01/19/15 [phone]

## 2015-01-20 ENCOUNTER — Encounter (HOSPITAL_COMMUNITY): Payer: Self-pay | Admitting: *Deleted

## 2015-01-20 ENCOUNTER — Emergency Department (HOSPITAL_COMMUNITY)
Admission: EM | Admit: 2015-01-20 | Discharge: 2015-01-20 | Disposition: A | Discharge status: Home / Self Care | Payer: Medicaid Other | Attending: Emergency Medicine | Admitting: Emergency Medicine

## 2015-01-20 DIAGNOSIS — Z760 Encounter for issue of repeat prescription: Secondary | ICD-10-CM | POA: Insufficient documentation

## 2015-01-20 DIAGNOSIS — Z7951 Long term (current) use of inhaled steroids: Secondary | ICD-10-CM | POA: Diagnosis not present

## 2015-01-20 DIAGNOSIS — L03113 Cellulitis of right upper limb: Secondary | ICD-10-CM | POA: Insufficient documentation

## 2015-01-20 DIAGNOSIS — Z8673 Personal history of transient ischemic attack (TIA), and cerebral infarction without residual deficits: Secondary | ICD-10-CM | POA: Diagnosis not present

## 2015-01-20 DIAGNOSIS — Z792 Long term (current) use of antibiotics: Secondary | ICD-10-CM | POA: Diagnosis not present

## 2015-01-20 DIAGNOSIS — Z7952 Long term (current) use of systemic steroids: Secondary | ICD-10-CM | POA: Diagnosis not present

## 2015-01-20 DIAGNOSIS — L03119 Cellulitis of unspecified part of limb: Secondary | ICD-10-CM

## 2015-01-20 DIAGNOSIS — Z7982 Long term (current) use of aspirin: Secondary | ICD-10-CM | POA: Diagnosis not present

## 2015-01-20 DIAGNOSIS — Z79899 Other long term (current) drug therapy: Secondary | ICD-10-CM | POA: Insufficient documentation

## 2015-01-20 DIAGNOSIS — L02519 Cutaneous abscess of unspecified hand: Secondary | ICD-10-CM

## 2015-01-20 DIAGNOSIS — I10 Essential (primary) hypertension: Secondary | ICD-10-CM | POA: Diagnosis not present

## 2015-01-20 DIAGNOSIS — L02511 Cutaneous abscess of right hand: Secondary | ICD-10-CM | POA: Insufficient documentation

## 2015-01-20 DIAGNOSIS — E119 Type 2 diabetes mellitus without complications: Secondary | ICD-10-CM | POA: Diagnosis not present

## 2015-01-20 DIAGNOSIS — I25119 Atherosclerotic heart disease of native coronary artery with unspecified angina pectoris: Secondary | ICD-10-CM | POA: Insufficient documentation

## 2015-01-20 DIAGNOSIS — E785 Hyperlipidemia, unspecified: Secondary | ICD-10-CM | POA: Insufficient documentation

## 2015-01-20 DIAGNOSIS — I252 Old myocardial infarction: Secondary | ICD-10-CM | POA: Diagnosis not present

## 2015-01-20 DIAGNOSIS — Z951 Presence of aortocoronary bypass graft: Secondary | ICD-10-CM | POA: Insufficient documentation

## 2015-01-20 DIAGNOSIS — M79641 Pain in right hand: Secondary | ICD-10-CM | POA: Diagnosis present

## 2015-01-20 DIAGNOSIS — Z720 Tobacco use: Secondary | ICD-10-CM | POA: Insufficient documentation

## 2015-01-20 DIAGNOSIS — M129 Arthropathy, unspecified: Secondary | ICD-10-CM | POA: Insufficient documentation

## 2015-01-20 DIAGNOSIS — H409 Unspecified glaucoma: Secondary | ICD-10-CM | POA: Diagnosis not present

## 2015-01-20 MED ORDER — SULFAMETHOXAZOLE-TRIMETHOPRIM 800-160 MG PO TABS: 1.0000 | ORAL_TABLET | Freq: Two times a day (BID) | ORAL | Status: DC

## 2015-01-20 MED ORDER — ALBUTEROL SULFATE HFA 108 (90 BASE) MCG/ACT IN AERS
1.0000 | INHALATION_SPRAY | RESPIRATORY_TRACT | Status: DC | PRN
  Administered 2015-01-20: 2 via RESPIRATORY_TRACT
  Filled 2015-01-20: qty 6.7

## 2015-01-20 MED ORDER — SULFAMETHOXAZOLE-TRIMETHOPRIM 800-160 MG PO TABS
1.0000 | ORAL_TABLET | Freq: Once | ORAL | Status: AC
  Administered 2015-01-20: 1 via ORAL
  Filled 2015-01-20: qty 1

## 2015-01-20 NOTE — ED Notes (Signed)
Pt returns to ED for recheck of rt hand . P:t was seen on Thursday. Pt reported to EMS that she did not fill the RX for antibx.

## 2015-01-20 NOTE — ED Provider Notes (Signed)
CSN: [id number]     Arrival date & time 01/20/15  1605 History  This chart was scribed for non-physician practitioner, Larene Pickett, PA-C, working with Ernestina Patches, MD, by Jeanell Sparrow, ED Scribe. This patient was seen in room TR10C/TR10C and the patient's care was started at 4:22 PM.  Chief Complaint  Patient presents with  . Hand Pain   The history is provided by the patient. No language interpreter was used.  HPI Comments: [name] [name] is a 64 y.o. female who presents to the Emergency Department complaining of constant moderate right hand paint that started 3 days ago. She reports that she came here for a recheck of her right hand from when she was here at the ED 2 days ago and had abscess I&D.  She states that she was not able to get her prescription for an antibiotic filled due to the recent road conditions. She reports that movement exacerbates the pain.  No fever, chills, sweats.  No numbness or paresthesias of hand.  Patient also requests refill of her albuterol inhaler for home as she left hers at the shelter.  Denies chest pain or SOB.   Past Medical History  Diagnosis Date  . Hypertension   . Diabetes mellitus     diagnosed in 2008  . Glaucoma   . Hyperlipidemia   . CVA (cerebral infarction)     right internal capsule stroke in 12/2006  . Left-sided sensory deficit present   . Cocaine abuse     crack cocaine heavily until 2008 then sporatic use since then  . Tobacco abuse   . Thyroid nodule     FNA in [id number] showed follicular cells but not definate neoplasm  . Coronary artery disease   . Myocardial infarction   . Anginal pain   . Stroke   . 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 Coronary artery bypass graft  07/09/2012    Procedure: CORONARY ARTERY BYPASS GRAFTING (CABG);  Surgeon: Ivin Poot, MD;  Location: [place];  Service: Open Heart Surgery;  Laterality: N/A;  . Cardiac catheterization    . Sternal  wound debridement  08/17/2012    Procedure: STERNAL WOUND DEBRIDEMENT;  Surgeon: Ivin Poot, MD;  Location: [place];  Service: Thoracic;  Laterality: N/A;  wound vac application  . Sternal wound debridement  08/24/2012    Procedure: STERNAL WOUND DEBRIDEMENT;  Surgeon: Ivin Poot, MD;  Location: [place];  Service: Thoracic;  Laterality: N/A;  . Sternal wound debridement  09/01/2012    Procedure: STERNAL WOUND DEBRIDEMENT;  Surgeon: Ivin Poot, MD;  Location: [place];  Service: Thoracic;  Laterality: N/A;  . Sternal wound debridement  09/20/2012    Procedure: STERNAL WOUND DEBRIDEMENT;  Surgeon: Ivin Poot, MD;  Location: [facility] OR;  Service: Thoracic;  Laterality: N/A;  wound vac change  . Left heart catheterization with coronary angiogram N/A 06/29/2012    Procedure: LEFT HEART CATHETERIZATION WITH CORONARY ANGIOGRAM;  Surgeon: Peter M Martinique, MD;  Location: [facility] CATH LAB;  Service: Cardiovascular;  Laterality: N/A;   Family History  Problem Relation Age of Onset  . Other      no known family CAD   History  Substance Use Topics  . Smoking status: Current Every Day Smoker -- 0.25 packs/day  . Smokeless tobacco: Not on file  . Alcohol Use: No   OB History    No data available  Review of Systems  Skin: Positive for wound.  All other systems reviewed and are negative.  Allergies  Naproxen  Home Medications   Prior to Admission medications   Medication Sig Start Date End Date Taking? Authorizing Provider  aspirin 81 MG tablet Take 1 tablet (81 mg total) by mouth daily. 09/21/13   Peter M Martinique, MD  benzonatate (TESSALON) 200 MG capsule Take 1 capsule (200 mg total) by mouth 3 (three) times daily as needed for cough. 10/23/14   [name], DO  brinzolamide (AZOPT) 1 % ophthalmic suspension Place 1 drop into both eyes 3 (three) times daily.    Historical Provider, MD  cetirizine (ZYRTEC) 10 MG tablet Take 10 mg by mouth daily as needed.     Historical Provider, MD   Cyanocobalamin (VITAMIN B-12 PO) Take 1 tablet by mouth daily.    Historical Provider, MD  Fluticasone-Salmeterol (ADVAIR) 250-50 MCG/DOSE AEPB Inhale 1 puff into the lungs every 12 (twelve) hours.    Historical Provider, MD  furosemide (LASIX) 40 MG tablet Take 40 mg by mouth daily.    Historical Provider, MD  gabapentin (NEURONTIN) 100 MG capsule Take 100 mg by mouth daily.    Historical Provider, MD  glimepiride (AMARYL) 4 MG tablet Take 4 mg by mouth daily before breakfast.    Historical Provider, MD  HYDROcodone-acetaminophen (NORCO/VICODIN) 5-325 MG per tablet Take 1 tablet by mouth every 4 (four) hours as needed. 01/18/15   Britt Bottom, NP  latanoprost (XALATAN) 0.005 % ophthalmic solution Place 1 drop into both eyes at bedtime.    Historical Provider, MD  levofloxacin (LEVAQUIN) 750 MG tablet Take 1 tablet (750 mg total) by mouth daily. 10/23/14   [name], DO  lisinopril (PRINIVIL,ZESTRIL) 5 MG tablet Take 5 mg by mouth daily.    Historical Provider, MD  metFORMIN (GLUCOPHAGE) 500 MG tablet Take 1,000 mg by mouth 2 (two) times daily with a meal.    Historical Provider, MD  metoprolol tartrate (LOPRESSOR) 25 MG tablet Take 1 tablet (25 mg total) by mouth 2 (two) times daily. 08/22/12   [name], PA-C  pravastatin (PRAVACHOL) 40 MG tablet Take 40 mg by mouth at bedtime.    Historical Provider, MD  predniSONE (STERAPRED UNI-PAK) 10 MG tablet Take by mouth daily. Prednisone dosing: Take  Prednisone 40mg  (4 tabs) x 3 days, then taper to 30mg  (3 tabs) x 3 days, then 20mg  (2 tabs) x 3days, then 10mg  (1 tab) x 3days, then OFF. Dispense:  30 tabs, refills: None 10/23/14   [name], DO  sulfamethoxazole-trimethoprim (BACTRIM DS,SEPTRA DS) 800-160 MG per tablet Take 1 tablet by mouth 2 (two) times daily. 01/18/15 01/25/15  Britt Bottom, NP  traMADol (ULTRAM) 50 MG tablet Take 50 mg by mouth 3 (three) times daily.     Historical Provider, MD   BP 154/69 mmHg  Pulse  94  Temp(Src) 99.2 F (37.3 C) (Oral)  Resp 22  Ht 5\' 8"  (1.727 m)  Wt 261 lb (118.389 kg)  BMI 39.69 kg/m2  SpO2 99%  Physical Exam  Constitutional: She is oriented to person, place, and time. She appears well-developed and well-nourished.  texting on cell phone using right hand without difficulty  HENT:  Head: Normocephalic and atraumatic.  Mouth/Throat: Oropharynx is clear and moist.  Eyes: Conjunctivae and EOM are normal. Pupils are equal, round, and reactive to light.  Neck: Normal range of motion.  Cardiovascular: Normal rate, regular rhythm and normal heart sounds.   Pulmonary/Chest: Effort  normal and breath sounds normal. No respiratory distress. She has no wheezes. She has no rhonchi. She has no rales.  Musculoskeletal: Normal range of motion.  Right hand with visible area of I&D at base of palm, locally tender without central fluctuance, mild swelling and erythema noted; no streaking into fingers or up arm; full ROM of all fingers  Neurological: She is alert and oriented to person, place, and time.  Skin: Skin is warm and dry.  Psychiatric: She has a normal mood and affect.  Nursing note and vitals reviewed.   ED Course  Procedures (including critical care time) DIAGNOSTIC STUDIES: Oxygen Saturation is 99% on RA, normal by my interpretation.    COORDINATION OF CARE: 4:26 PM- Pt advised of plan for treatment which includes medication and pt agrees.  Labs Review Labs Reviewed - No data to display  Imaging Review No results found.   EKG Interpretation None      MDM   Final diagnoses:  Cellulitis and abscess of hand, except fingers and thumb   64 year old female here for recheck of right hand wound. She has not filled the antibiotics due to transportation issues. On recheck, patient with small area of I&D without fluctuance, significant streaking, or induration. Nursing staff reports that was present during initial visit states wound does not look any  different than her initial presentation.  I have represcribed patient's Bactrim, first dose given in the ED. She was also given an albuterol inhaler at her request. She was instructed she must fill the antibiotics over her infection to clear. She is to follow-up with her primary care physician in 2 days for recheck.  Discussed plan with patient, he/she acknowledged understanding and agreed with plan of care.  Return precautions given for new or worsening symptoms.  I personally performed the services described in this documentation, which was scribed in my presence. The recorded information has been reviewed and is accurate.  Larene Pickett, PA-C 01/20/15 1654  Ernestina Patches, MD 01/20/15 2127

## 2015-01-20 NOTE — ED Notes (Signed)
Pt did not fill the Rx given to her on D/C Thursday.

## 2015-01-20 NOTE — Discharge Instructions (Signed)
Take the prescribed medication as directed.  It is imperative that you take these antibiotics for infection to clear. Follow-up with your primary care physician in 2 days for re-check. Return to the ED for new or worsening symptoms.

## 2015-01-20 NOTE — ED Notes (Signed)
Declined W/C at D/C and was escorted to lobby by RN. 

## 2015-01-25 ENCOUNTER — Emergency Department (HOSPITAL_COMMUNITY)
Admission: EM | Admit: 2015-01-25 | Discharge: 2015-01-25 | Disposition: A | Discharge status: Home / Self Care | Payer: Medicaid Other | Attending: Emergency Medicine | Admitting: Emergency Medicine

## 2015-01-25 ENCOUNTER — Encounter (HOSPITAL_COMMUNITY): Payer: Self-pay

## 2015-01-25 DIAGNOSIS — Z7982 Long term (current) use of aspirin: Secondary | ICD-10-CM | POA: Insufficient documentation

## 2015-01-25 DIAGNOSIS — Z9889 Other specified postprocedural states: Secondary | ICD-10-CM | POA: Diagnosis not present

## 2015-01-25 DIAGNOSIS — Z8673 Personal history of transient ischemic attack (TIA), and cerebral infarction without residual deficits: Secondary | ICD-10-CM | POA: Diagnosis not present

## 2015-01-25 DIAGNOSIS — H409 Unspecified glaucoma: Secondary | ICD-10-CM | POA: Diagnosis not present

## 2015-01-25 DIAGNOSIS — E041 Nontoxic single thyroid nodule: Secondary | ICD-10-CM | POA: Diagnosis not present

## 2015-01-25 DIAGNOSIS — I10 Essential (primary) hypertension: Secondary | ICD-10-CM | POA: Insufficient documentation

## 2015-01-25 DIAGNOSIS — Z951 Presence of aortocoronary bypass graft: Secondary | ICD-10-CM | POA: Diagnosis not present

## 2015-01-25 DIAGNOSIS — Z7952 Long term (current) use of systemic steroids: Secondary | ICD-10-CM | POA: Insufficient documentation

## 2015-01-25 DIAGNOSIS — Z7951 Long term (current) use of inhaled steroids: Secondary | ICD-10-CM | POA: Diagnosis not present

## 2015-01-25 DIAGNOSIS — M19012 Primary osteoarthritis, left shoulder: Secondary | ICD-10-CM | POA: Diagnosis not present

## 2015-01-25 DIAGNOSIS — Z720 Tobacco use: Secondary | ICD-10-CM | POA: Insufficient documentation

## 2015-01-25 DIAGNOSIS — E785 Hyperlipidemia, unspecified: Secondary | ICD-10-CM | POA: Diagnosis not present

## 2015-01-25 DIAGNOSIS — L02511 Cutaneous abscess of right hand: Secondary | ICD-10-CM | POA: Diagnosis not present

## 2015-01-25 DIAGNOSIS — E119 Type 2 diabetes mellitus without complications: Secondary | ICD-10-CM | POA: Insufficient documentation

## 2015-01-25 DIAGNOSIS — I2510 Atherosclerotic heart disease of native coronary artery without angina pectoris: Secondary | ICD-10-CM | POA: Diagnosis not present

## 2015-01-25 DIAGNOSIS — Z792 Long term (current) use of antibiotics: Secondary | ICD-10-CM | POA: Insufficient documentation

## 2015-01-25 DIAGNOSIS — I252 Old myocardial infarction: Secondary | ICD-10-CM | POA: Insufficient documentation

## 2015-01-25 DIAGNOSIS — Z79899 Other long term (current) drug therapy: Secondary | ICD-10-CM | POA: Diagnosis not present

## 2015-01-25 MED ORDER — AMOXICILLIN-POT CLAVULANATE 875-125 MG PO TABS
1.0000 | ORAL_TABLET | Freq: Once | ORAL | Status: AC
  Administered 2015-01-25: 1 via ORAL
  Filled 2015-01-25: qty 1

## 2015-01-25 MED ORDER — AMOXICILLIN-POT CLAVULANATE 875-125 MG PO TABS: 1.0000 | ORAL_TABLET | Freq: Two times a day (BID) | ORAL | Status: DC

## 2015-01-25 MED ORDER — LIDOCAINE HCL (PF) 1 % IJ SOLN
5.0000 mL | Freq: Once | INTRAMUSCULAR | Status: AC
  Administered 2015-01-25: 5 mL
  Filled 2015-01-25: qty 5

## 2015-01-25 NOTE — Discharge Instructions (Signed)
Abscess FINISH THE ANTIBIOTIC (BACTRIM) THAT YOU STARTED Tuesday. START THE ANTIBIOTIC PRESCRIBED TODAY THIS EVENING.   An abscess is an infected area that contains a collection of pus and debris.It can occur in almost any part of the body. An abscess is also known as a furuncle or boil. CAUSES  An abscess occurs when tissue gets infected. This can occur from blockage of oil or sweat glands, infection of hair follicles, or a minor injury to the skin. As the body tries to fight the infection, pus collects in the area and creates pressure under the skin. This pressure causes pain. People with weakened immune systems have difficulty fighting infections and get certain abscesses more often.  SYMPTOMS Usually an abscess develops on the skin and becomes a painful mass that is red, warm, and tender. If the abscess forms under the skin, you may feel a moveable soft area under the skin. Some abscesses break open (rupture) on their own, but most will continue to get worse without care. The infection can spread deeper into the body and eventually into the bloodstream, causing you to feel ill.  DIAGNOSIS  Your caregiver will take your medical history and perform a physical exam. A sample of fluid may also be taken from the abscess to determine what is causing your infection. TREATMENT  Your caregiver may prescribe antibiotic medicines to fight the infection. However, taking antibiotics alone usually does not cure an abscess. Your caregiver may need to make a small cut (incision) in the abscess to drain the pus. In some cases, gauze is packed into the abscess to reduce pain and to continue draining the area. HOME CARE INSTRUCTIONS   Only take over-the-counter or prescription medicines for pain, discomfort, or fever as directed by your caregiver.  If you were prescribed antibiotics, take them as directed. Finish them even if you start to feel better.  If gauze is used, follow your caregiver's directions for  changing the gauze.  To avoid spreading the infection:  Keep your draining abscess covered with a bandage.  Wash your hands well.  Do not share personal care items, towels, or whirlpools with others.  Avoid skin contact with others.  Keep your skin and clothes clean around the abscess.  Keep all follow-up appointments as directed by your caregiver. SEEK MEDICAL CARE IF:   You have increased pain, swelling, redness, fluid drainage, or bleeding.  You have muscle aches, chills, or a general ill feeling.  You have a fever. MAKE SURE YOU:   Understand these instructions.  Will watch your condition.  Will get help right away if you are not doing well or get worse. Document Released: 09/24/2005 Document Revised: 06/15/2012 Document Reviewed: 02/27/2012 [facility] Patient Information 2015 [place], [place]. This information is not intended to replace advice given to you by your health care provider. Make sure you discuss any questions you have with your health care provider.

## 2015-01-25 NOTE — ED Notes (Signed)
Pt here for evaluation of abscess to right hand.  Pt seen here for same last week.  Went to see PCP today and was sent here to have hand doctor evaluate abscess.

## 2015-01-25 NOTE — ED Provider Notes (Signed)
CSN: [id number]     Arrival date & time 01/25/15  1201 History   First MD Initiated Contact with Patient 01/25/15 1300     Chief Complaint  Patient presents with  . Abscess     (Consider location/radiation/quality/duration/timing/severity/associated sxs/prior Treatment) HPI The patient was seen 6 days ago for what at that point in time is described as a small blister like lesion on the palm of her hand. It was unroofed with a small amount of pus extruded. The patient was rechecked 2 days later and per the note it appears there was not any significant change. At that time the patient had not been able to get her antibiotic prescription. Now she has obtained her antibiotics and she has taken 3 days worth. The patient reports that the area has gotten increasingly painful and it looks like there is pus underneath the wound. She reports there is no pain into her wrist, no difficulty moving the wrist or opening or closing the hand. She denies any prior injury or foreign body into the hand. Past Medical History  Diagnosis Date  . Hypertension   . Diabetes mellitus     diagnosed in 2008  . Glaucoma   . Hyperlipidemia   . CVA (cerebral infarction)     right internal capsule stroke in 12/2006  . Left-sided sensory deficit present   . Cocaine abuse     crack cocaine heavily until 2008 then sporatic use since then  . Tobacco abuse   . Thyroid nodule     FNA in [id number] showed follicular cells but not definate neoplasm  . Coronary artery disease   . Myocardial infarction   . Anginal pain   . Stroke   . 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 Coronary artery bypass graft  07/09/2012    Procedure: CORONARY ARTERY BYPASS GRAFTING (CABG);  Surgeon: Ivin Poot, MD;  Location: [place];  Service: Open Heart Surgery;  Laterality: N/A;  . Cardiac catheterization    . Sternal wound debridement  08/17/2012    Procedure: STERNAL WOUND DEBRIDEMENT;   Surgeon: Ivin Poot, MD;  Location: [place];  Service: Thoracic;  Laterality: N/A;  wound vac application  . Sternal wound debridement  08/24/2012    Procedure: STERNAL WOUND DEBRIDEMENT;  Surgeon: Ivin Poot, MD;  Location: [place];  Service: Thoracic;  Laterality: N/A;  . Sternal wound debridement  09/01/2012    Procedure: STERNAL WOUND DEBRIDEMENT;  Surgeon: Ivin Poot, MD;  Location: [place];  Service: Thoracic;  Laterality: N/A;  . Sternal wound debridement  09/20/2012    Procedure: STERNAL WOUND DEBRIDEMENT;  Surgeon: Ivin Poot, MD;  Location: [facility] OR;  Service: Thoracic;  Laterality: N/A;  wound vac change  . Left heart catheterization with coronary angiogram N/A 06/29/2012    Procedure: LEFT HEART CATHETERIZATION WITH CORONARY ANGIOGRAM;  Surgeon: Peter M Martinique, MD;  Location: [facility] CATH LAB;  Service: Cardiovascular;  Laterality: N/A;   Family History  Problem Relation Age of Onset  . Other      no known family CAD   History  Substance Use Topics  . Smoking status: Current Every Day Smoker -- 0.25 packs/day  . Smokeless tobacco: Not on file  . Alcohol Use: No   OB History    No data available     Review of Systems Visual: No fever no chills no general malaise GI: No nausea no vomiting  Allergies  Naproxen  Home Medications   Prior to Admission medications   Medication Sig Start Date End Date Taking? Authorizing Provider  amoxicillin-clavulanate (AUGMENTIN) 875-125 MG per tablet Take 1 tablet by mouth 2 (two) times daily. One po bid x 7 days 01/25/15   Charlesetta Shanks, MD  aspirin 81 MG tablet Take 1 tablet (81 mg total) by mouth daily. 09/21/13   Peter M Martinique, MD  benzonatate (TESSALON) 200 MG capsule Take 1 capsule (200 mg total) by mouth 3 (three) times daily as needed for cough. 10/23/14   [name], DO  brinzolamide (AZOPT) 1 % ophthalmic suspension Place 1 drop into both eyes 3 (three) times daily.    Historical Provider, MD  cetirizine (ZYRTEC) 10 MG  tablet Take 10 mg by mouth daily as needed.     Historical Provider, MD  Cyanocobalamin (VITAMIN B-12 PO) Take 1 tablet by mouth daily.    Historical Provider, MD  Fluticasone-Salmeterol (ADVAIR) 250-50 MCG/DOSE AEPB Inhale 1 puff into the lungs every 12 (twelve) hours.    Historical Provider, MD  furosemide (LASIX) 40 MG tablet Take 40 mg by mouth daily.    Historical Provider, MD  gabapentin (NEURONTIN) 100 MG capsule Take 100 mg by mouth daily.    Historical Provider, MD  glimepiride (AMARYL) 4 MG tablet Take 4 mg by mouth daily before breakfast.    Historical Provider, MD  HYDROcodone-acetaminophen (NORCO/VICODIN) 5-325 MG per tablet Take 1 tablet by mouth every 4 (four) hours as needed. 01/18/15   Britt Bottom, NP  latanoprost (XALATAN) 0.005 % ophthalmic solution Place 1 drop into both eyes at bedtime.    Historical Provider, MD  levofloxacin (LEVAQUIN) 750 MG tablet Take 1 tablet (750 mg total) by mouth daily. 10/23/14   [name], DO  lisinopril (PRINIVIL,ZESTRIL) 5 MG tablet Take 5 mg by mouth daily.    Historical Provider, MD  metFORMIN (GLUCOPHAGE) 500 MG tablet Take 1,000 mg by mouth 2 (two) times daily with a meal.    Historical Provider, MD  metoprolol tartrate (LOPRESSOR) 25 MG tablet Take 1 tablet (25 mg total) by mouth 2 (two) times daily. 08/22/12   [name], PA-C  pravastatin (PRAVACHOL) 40 MG tablet Take 40 mg by mouth at bedtime.    Historical Provider, MD  predniSONE (STERAPRED UNI-PAK) 10 MG tablet Take by mouth daily. Prednisone dosing: Take  Prednisone 40mg  (4 tabs) x 3 days, then taper to 30mg  (3 tabs) x 3 days, then 20mg  (2 tabs) x 3days, then 10mg  (1 tab) x 3days, then OFF. Dispense:  30 tabs, refills: None 10/23/14   [name], DO  sulfamethoxazole-trimethoprim (SEPTRA DS) 800-160 MG per tablet Take 1 tablet by mouth every 12 (twelve) hours. 01/20/15   Larene Pickett, PA-C  traMADol (ULTRAM) 50 MG tablet Take 50 mg by mouth 3 (three) times  daily.     Historical Provider, MD   BP 184/85 mmHg  Pulse 67  Temp(Src) 98.2 F (36.8 C) (Oral)  Resp 22  SpO2 99% Physical Exam  Constitutional: She appears well-developed and well-nourished. No distress.  HENT:  Head: Normocephalic and atraumatic.  Eyes: EOM are normal.  Pulmonary/Chest: Effort normal.  Musculoskeletal:  Patient has a localized area of abscess on the palm of her hand which is approximately 1 cm in diameter. There is a pinpoint opening where pus can be extruded. There is no surrounding cellulitis. The patient has no tenderness to palpation over the wrist or the forearm. There is no pain  reproducible by range of motion at the wrist or range of motion of the digits.      ED Course  INCISION AND DRAINAGE Date/Time: 01/25/2015 3:10 PM Performed by: Charlesetta Shanks Authorized by: Charlesetta Shanks Consent: Verbal consent obtained. Type: abscess Body area: upper extremity Location details: right hand Anesthesia: local infiltration Local anesthetic: lidocaine 1% without epinephrine Anesthetic total: 1 ml Scalpel size: 11 Incision type: single straight Complexity: simple Drainage: purulent Drainage amount: moderate Wound treatment: wound left open Packing material: 1/4 in iodoform gauze Patient tolerance: Patient tolerated the procedure well with no immediate complications Comments: Abscess was opened with a straight incision and spreads up with a hemostat. A moderate amount of pus was expressed. Iodoform gauze was placed and the wound was dressed.   (including critical care time)  Labs Review Labs Reviewed  WOUND CULTURE    Imaging Review No results found.   EKG Interpretation None      MDM   Final diagnoses:  Abscess of hand, right   At this time the abscess appears to still be very local. There is no evidence of deeper hand space infection with the patient having good range of motion of the digits and the wrist. A larger incision and drainage  was performed with iodoform gauze placement. The patient is to finish her Bactrim as prescribed and add Augmentin. She has for recheck within 2 days and to return if there is worsening pain or swelling.    Charlesetta Shanks, MD 01/25/15 [phone]

## 2015-01-27 ENCOUNTER — Emergency Department (HOSPITAL_COMMUNITY)
Admission: EM | Admit: 2015-01-27 | Discharge: 2015-01-27 | Disposition: A | Discharge status: Home / Self Care | Payer: Medicaid Other | Attending: Emergency Medicine | Admitting: Emergency Medicine

## 2015-01-27 ENCOUNTER — Encounter (HOSPITAL_COMMUNITY): Payer: Self-pay | Admitting: Cardiology

## 2015-01-27 DIAGNOSIS — Z792 Long term (current) use of antibiotics: Secondary | ICD-10-CM | POA: Diagnosis not present

## 2015-01-27 DIAGNOSIS — Z79899 Other long term (current) drug therapy: Secondary | ICD-10-CM | POA: Diagnosis not present

## 2015-01-27 DIAGNOSIS — Z7951 Long term (current) use of inhaled steroids: Secondary | ICD-10-CM | POA: Diagnosis not present

## 2015-01-27 DIAGNOSIS — Z7982 Long term (current) use of aspirin: Secondary | ICD-10-CM | POA: Insufficient documentation

## 2015-01-27 DIAGNOSIS — E119 Type 2 diabetes mellitus without complications: Secondary | ICD-10-CM | POA: Diagnosis not present

## 2015-01-27 DIAGNOSIS — I2510 Atherosclerotic heart disease of native coronary artery without angina pectoris: Secondary | ICD-10-CM | POA: Insufficient documentation

## 2015-01-27 DIAGNOSIS — Z720 Tobacco use: Secondary | ICD-10-CM | POA: Diagnosis not present

## 2015-01-27 DIAGNOSIS — Z8673 Personal history of transient ischemic attack (TIA), and cerebral infarction without residual deficits: Secondary | ICD-10-CM | POA: Diagnosis not present

## 2015-01-27 DIAGNOSIS — M19012 Primary osteoarthritis, left shoulder: Secondary | ICD-10-CM | POA: Diagnosis not present

## 2015-01-27 DIAGNOSIS — Z951 Presence of aortocoronary bypass graft: Secondary | ICD-10-CM | POA: Diagnosis not present

## 2015-01-27 DIAGNOSIS — H409 Unspecified glaucoma: Secondary | ICD-10-CM | POA: Diagnosis not present

## 2015-01-27 DIAGNOSIS — I10 Essential (primary) hypertension: Secondary | ICD-10-CM | POA: Insufficient documentation

## 2015-01-27 DIAGNOSIS — Z4800 Encounter for change or removal of nonsurgical wound dressing: Secondary | ICD-10-CM | POA: Insufficient documentation

## 2015-01-27 DIAGNOSIS — E785 Hyperlipidemia, unspecified: Secondary | ICD-10-CM | POA: Diagnosis not present

## 2015-01-27 DIAGNOSIS — I252 Old myocardial infarction: Secondary | ICD-10-CM | POA: Insufficient documentation

## 2015-01-27 DIAGNOSIS — Z9889 Other specified postprocedural states: Secondary | ICD-10-CM | POA: Diagnosis not present

## 2015-01-27 DIAGNOSIS — Z5189 Encounter for other specified aftercare: Secondary | ICD-10-CM

## 2015-01-27 NOTE — ED Provider Notes (Signed)
CSN: [id number]     Arrival date & time 01/27/15  1538 History   This chart was scribed for non-physician practitioner, Dason Mosley Marilu Favre PA-C, working with Francine Graven, DO by Evelene Croon, ED Scribe. This patient was seen in room TR08C/TR08C and the patient's care was started at 4:21 PM.    Chief Complaint  Patient presents with  . Follow-up     The history is provided by the patient. No language interpreter was used.     HPI Comments:  [name] [name] is a 64 y.o. female who presents to the Emergency Department for a follow-up. Pt had I&D to her right palm on 01/25/2015 of an abscess that she originally noticed about 2 weeks ago. She notes pain has significantly improved since but still reports mild pain to site. She is currently on 2 antibiotics and has been compliant with dose; she has about 5 more days left of course. She denies fever, nausea, weakness, dizziness and vomiting.No alleviating factors noted. Pt is right hand dominant.   Past Medical History  Diagnosis Date  . Hypertension   . Diabetes mellitus     diagnosed in 2008  . Glaucoma   . Hyperlipidemia   . CVA (cerebral infarction)     right internal capsule stroke in 12/2006  . Left-sided sensory deficit present   . Cocaine abuse     crack cocaine heavily until 2008 then sporatic use since then  . Tobacco abuse   . Thyroid nodule     FNA in [id number] showed follicular cells but not definate neoplasm  . Coronary artery disease   . Myocardial infarction   . Anginal pain   . Stroke   . 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 Coronary artery bypass graft  07/09/2012    Procedure: CORONARY ARTERY BYPASS GRAFTING (CABG);  Surgeon: Ivin Poot, MD;  Location: [place];  Service: Open Heart Surgery;  Laterality: N/A;  . Cardiac catheterization    . Sternal wound debridement  08/17/2012    Procedure: STERNAL WOUND DEBRIDEMENT;  Surgeon: Ivin Poot, MD;   Location: [place];  Service: Thoracic;  Laterality: N/A;  wound vac application  . Sternal wound debridement  08/24/2012    Procedure: STERNAL WOUND DEBRIDEMENT;  Surgeon: Ivin Poot, MD;  Location: [place];  Service: Thoracic;  Laterality: N/A;  . Sternal wound debridement  09/01/2012    Procedure: STERNAL WOUND DEBRIDEMENT;  Surgeon: Ivin Poot, MD;  Location: [place];  Service: Thoracic;  Laterality: N/A;  . Sternal wound debridement  09/20/2012    Procedure: STERNAL WOUND DEBRIDEMENT;  Surgeon: Ivin Poot, MD;  Location: [facility] OR;  Service: Thoracic;  Laterality: N/A;  wound vac change  . Left heart catheterization with coronary angiogram N/A 06/29/2012    Procedure: LEFT HEART CATHETERIZATION WITH CORONARY ANGIOGRAM;  Surgeon: Peter M Martinique, MD;  Location: [facility] CATH LAB;  Service: Cardiovascular;  Laterality: N/A;   Family History  Problem Relation Age of Onset  . Other      no known family CAD   History  Substance Use Topics  . Smoking status: Current Every Day Smoker -- 0.25 packs/day  . Smokeless tobacco: Not on file  . Alcohol Use: No   OB History    No data available     Review of Systems  Constitutional: Negative for fever and chills.       Follow Up  Gastrointestinal:  Negative for nausea and vomiting.  Skin: Positive for wound.  Neurological: Negative for dizziness and weakness.  All other systems reviewed and are negative.     Allergies  Naproxen  Home Medications   Prior to Admission medications   Medication Sig Start Date End Date Taking? Authorizing Provider  amoxicillin-clavulanate (AUGMENTIN) 875-125 MG per tablet Take 1 tablet by mouth 2 (two) times daily. One po bid x 7 days 01/25/15   Charlesetta Shanks, MD  aspirin 81 MG tablet Take 1 tablet (81 mg total) by mouth daily. 09/21/13   Peter M Martinique, MD  benzonatate (TESSALON) 200 MG capsule Take 1 capsule (200 mg total) by mouth 3 (three) times daily as needed for cough. 10/23/14   [name], DO   brinzolamide (AZOPT) 1 % ophthalmic suspension Place 1 drop into both eyes 3 (three) times daily.    Historical Provider, MD  cetirizine (ZYRTEC) 10 MG tablet Take 10 mg by mouth daily as needed.     Historical Provider, MD  Cyanocobalamin (VITAMIN B-12 PO) Take 1 tablet by mouth daily.    Historical Provider, MD  Fluticasone-Salmeterol (ADVAIR) 250-50 MCG/DOSE AEPB Inhale 1 puff into the lungs every 12 (twelve) hours.    Historical Provider, MD  furosemide (LASIX) 40 MG tablet Take 40 mg by mouth daily.    Historical Provider, MD  gabapentin (NEURONTIN) 100 MG capsule Take 100 mg by mouth daily.    Historical Provider, MD  glimepiride (AMARYL) 4 MG tablet Take 4 mg by mouth daily before breakfast.    Historical Provider, MD  HYDROcodone-acetaminophen (NORCO/VICODIN) 5-325 MG per tablet Take 1 tablet by mouth every 4 (four) hours as needed. 01/18/15   Britt Bottom, NP  latanoprost (XALATAN) 0.005 % ophthalmic solution Place 1 drop into both eyes at bedtime.    Historical Provider, MD  levofloxacin (LEVAQUIN) 750 MG tablet Take 1 tablet (750 mg total) by mouth daily. 10/23/14   [name], DO  lisinopril (PRINIVIL,ZESTRIL) 5 MG tablet Take 5 mg by mouth daily.    Historical Provider, MD  metFORMIN (GLUCOPHAGE) 500 MG tablet Take 1,000 mg by mouth 2 (two) times daily with a meal.    Historical Provider, MD  metoprolol tartrate (LOPRESSOR) 25 MG tablet Take 1 tablet (25 mg total) by mouth 2 (two) times daily. 08/22/12   [name], PA-C  pravastatin (PRAVACHOL) 40 MG tablet Take 40 mg by mouth at bedtime.    Historical Provider, MD  predniSONE (STERAPRED UNI-PAK) 10 MG tablet Take by mouth daily. Prednisone dosing: Take  Prednisone 40mg  (4 tabs) x 3 days, then taper to 30mg  (3 tabs) x 3 days, then 20mg  (2 tabs) x 3days, then 10mg  (1 tab) x 3days, then OFF. Dispense:  30 tabs, refills: None 10/23/14   [name], DO  sulfamethoxazole-trimethoprim (SEPTRA DS) 800-160 MG per  tablet Take 1 tablet by mouth every 12 (twelve) hours. 01/20/15   Larene Pickett, PA-C  traMADol (ULTRAM) 50 MG tablet Take 50 mg by mouth 3 (three) times daily.     Historical Provider, MD   BP 153/79 mmHg  Pulse 80  Temp(Src) 98.3 F (36.8 C)  Resp 16  SpO2 96% Physical Exam  Constitutional: She is oriented to person, place, and time. She appears well-developed and well-nourished. No distress.  HENT:  Head: Normocephalic and atraumatic.  Eyes: Conjunctivae are normal.  Cardiovascular: Normal rate.   Pulmonary/Chest: Effort normal.  Abdominal: She exhibits no distension.  Neurological: She is alert and oriented to  person, place, and time.  Skin: Skin is warm.  Open abscess to right proximal palm that is open and not actively draining. No surrounding cellulitis. No swelling bleeding or associated tenderness noted. There is some mild firmness underlying the open wound.    Psychiatric: She has a normal mood and affect.  Nursing note and vitals reviewed.   ED Course  Procedures   DIAGNOSTIC STUDIES:  Oxygen Saturation is 96% on RA, normal by my interpretation.    COORDINATION OF CARE:  4:31 PM  Will follow up with pharmacy to assure pt is on the right abx. Discussed treatment plan with pt at bedside and pt agreed to plan. 4:35 PM Spoke with pharmacy who state they are still testing various abx but based on what grew in th culture she is on the best 2 abxs. Advised pt to keep wound clean and dry.  Labs Review Labs Reviewed - No data to display  Imaging Review No results found.   EKG Interpretation None      MDM   Final diagnoses:  Encounter for wound re-check    64 y.o.Cordie Speakman Breshears's evaluation in the Emergency Department is complete. It has been determined that no acute conditions requiring further emergency intervention are present at this time. The patient/guardian have been advised of the diagnosis and plan. We have discussed signs and symptoms that warrant return  to the ED, such as changes or worsening in symptoms.  Vital signs are stable at discharge. Filed Vitals:   01/27/15 1546  BP: 153/79  Pulse: 80  Temp: 98.3 F (36.8 C)  Resp: 16    Patient/guardian has voiced understanding and agreed to follow-up with the PCP or specialist.  I personally performed the services described in this documentation, which was scribed in my presence. The recorded information has been reviewed and is accurate.   Linus Mako, PA-C 01/27/15 [place], DO 01/29/15 [phone]

## 2015-01-27 NOTE — Discharge Instructions (Signed)
Wound Check °Your wound appears healthy today. Your wound will heal gradually over time. Eventually a scar will form that will fade with time. °FACTORS THAT AFFECT SCAR FORMATION: °· People differ in the severity in which they scar.  °· Scar severity varies according to location, size, and the traits you inherited from your parents (genetic predisposition). °· Irritation to the wound from infection, rubbing, or chemical exposure will increase the amount of scar formation. °HOME CARE INSTRUCTIONS  °· If you were given a dressing, you should change it at least once a day or as instructed by your caregiver. If the bandage sticks, soak it off with a solution of hydrogen peroxide. °· If the bandage becomes wet, dirty, or develops a bad smell, change it as soon as possible. °· Look for signs of infection. °· Only take over-the-counter or prescription medicines for pain, discomfort, or fever as directed by your caregiver. °SEEK IMMEDIATE MEDICAL CARE IF:  °· You have redness, swelling, or increasing pain in the wound. °· You notice pus coming from the wound. °· You have a fever. °· You notice a bad smell coming from the wound or dressing. °Document Released: 09/20/2004 Document Revised: 03/08/2012 Document Reviewed: 12/15/2005 °ExitCare® Patient Information ©2015 [employer]. This information is not intended to replace advice given to you by your health care provider. Make sure you discuss any questions you have with your health care provider. ° °

## 2015-01-27 NOTE — ED Notes (Signed)
Pt reports she was here a couple of days ago and had a placed on her right hand drained. States she is back today for a follow up and to have the packing removed.

## 2015-01-28 ENCOUNTER — Telehealth (HOSPITAL_COMMUNITY): Payer: Self-pay

## 2015-01-28 LAB — WOUND CULTURE

## 2015-01-28 NOTE — Telephone Encounter (Signed)
Post ED Visit - Positive Culture Follow-up  Culture report reviewed by antimicrobial stewardship pharmacist: []  Wes Glassmanor, Pharm.D., BCPS []  Heide Guile, Pharm.D., BCPS [x]  Alycia Rossetti, Pharm.D., BCPS []  [place], Pharm.D., BCPS, AAHIVP []  Legrand Como, Pharm.D., BCPS, AAHIVP []  Isac Sarna, Pharm.D., BCPS  Positive Wound culture, Few staph species Treated with Amoxicillin, organism sensitive to the same and no further patient follow-up is required at this time.  Dortha Kern 01/28/2015, 11:30 PM

## 2015-01-29 ENCOUNTER — Telehealth: Payer: Self-pay | Admitting: *Deleted

## 2015-05-12 ENCOUNTER — Other Ambulatory Visit (HOSPITAL_COMMUNITY): Payer: Self-pay

## 2015-05-12 ENCOUNTER — Emergency Department (HOSPITAL_COMMUNITY): Payer: Medicaid Other

## 2015-05-12 ENCOUNTER — Encounter (HOSPITAL_COMMUNITY): Payer: Self-pay | Admitting: Emergency Medicine

## 2015-05-12 ENCOUNTER — Other Ambulatory Visit (HOSPITAL_COMMUNITY): Payer: Medicaid Other

## 2015-05-12 ENCOUNTER — Inpatient Hospital Stay (HOSPITAL_COMMUNITY)
Admission: EM | Admit: 2015-05-12 | Discharge: 2015-05-17 | DRG: 286 | Disposition: A | Discharge status: Home Health | Payer: Medicaid Other | Attending: Internal Medicine | Admitting: Internal Medicine

## 2015-05-12 DIAGNOSIS — Z951 Presence of aortocoronary bypass graft: Secondary | ICD-10-CM

## 2015-05-12 DIAGNOSIS — N179 Acute kidney failure, unspecified: Secondary | ICD-10-CM | POA: Diagnosis present

## 2015-05-12 DIAGNOSIS — J810 Acute pulmonary edema: Secondary | ICD-10-CM

## 2015-05-12 DIAGNOSIS — I252 Old myocardial infarction: Secondary | ICD-10-CM | POA: Diagnosis not present

## 2015-05-12 DIAGNOSIS — Z6841 Body Mass Index (BMI) 40.0 and over, adult: Secondary | ICD-10-CM

## 2015-05-12 DIAGNOSIS — E1165 Type 2 diabetes mellitus with hyperglycemia: Secondary | ICD-10-CM | POA: Diagnosis present

## 2015-05-12 DIAGNOSIS — E785 Hyperlipidemia, unspecified: Secondary | ICD-10-CM | POA: Diagnosis present

## 2015-05-12 DIAGNOSIS — N183 Chronic kidney disease, stage 3 (moderate): Secondary | ICD-10-CM | POA: Diagnosis present

## 2015-05-12 DIAGNOSIS — I129 Hypertensive chronic kidney disease with stage 1 through stage 4 chronic kidney disease, or unspecified chronic kidney disease: Secondary | ICD-10-CM | POA: Diagnosis present

## 2015-05-12 DIAGNOSIS — E1100 Type 2 diabetes mellitus with hyperosmolarity without nonketotic hyperglycemic-hyperosmolar coma (NKHHC): Secondary | ICD-10-CM | POA: Diagnosis present

## 2015-05-12 DIAGNOSIS — I2510 Atherosclerotic heart disease of native coronary artery without angina pectoris: Secondary | ICD-10-CM | POA: Diagnosis present

## 2015-05-12 DIAGNOSIS — F17210 Nicotine dependence, cigarettes, uncomplicated: Secondary | ICD-10-CM | POA: Diagnosis present

## 2015-05-12 DIAGNOSIS — Z794 Long term (current) use of insulin: Secondary | ICD-10-CM | POA: Diagnosis not present

## 2015-05-12 DIAGNOSIS — I5023 Acute on chronic systolic (congestive) heart failure: Secondary | ICD-10-CM

## 2015-05-12 DIAGNOSIS — Z8673 Personal history of transient ischemic attack (TIA), and cerebral infarction without residual deficits: Secondary | ICD-10-CM

## 2015-05-12 DIAGNOSIS — R778 Other specified abnormalities of plasma proteins: Secondary | ICD-10-CM

## 2015-05-12 DIAGNOSIS — I5030 Unspecified diastolic (congestive) heart failure: Secondary | ICD-10-CM

## 2015-05-12 DIAGNOSIS — J9601 Acute respiratory failure with hypoxia: Secondary | ICD-10-CM | POA: Diagnosis not present

## 2015-05-12 DIAGNOSIS — I10 Essential (primary) hypertension: Secondary | ICD-10-CM | POA: Diagnosis not present

## 2015-05-12 DIAGNOSIS — G8929 Other chronic pain: Secondary | ICD-10-CM | POA: Diagnosis present

## 2015-05-12 DIAGNOSIS — I5043 Acute on chronic combined systolic (congestive) and diastolic (congestive) heart failure: Principal | ICD-10-CM | POA: Diagnosis present

## 2015-05-12 DIAGNOSIS — H409 Unspecified glaucoma: Secondary | ICD-10-CM | POA: Diagnosis present

## 2015-05-12 DIAGNOSIS — N189 Chronic kidney disease, unspecified: Secondary | ICD-10-CM | POA: Diagnosis not present

## 2015-05-12 DIAGNOSIS — F1410 Cocaine abuse, uncomplicated: Secondary | ICD-10-CM | POA: Diagnosis present

## 2015-05-12 DIAGNOSIS — Z7982 Long term (current) use of aspirin: Secondary | ICD-10-CM

## 2015-05-12 DIAGNOSIS — Z833 Family history of diabetes mellitus: Secondary | ICD-10-CM | POA: Diagnosis not present

## 2015-05-12 DIAGNOSIS — J441 Chronic obstructive pulmonary disease with (acute) exacerbation: Secondary | ICD-10-CM | POA: Diagnosis present

## 2015-05-12 DIAGNOSIS — I509 Heart failure, unspecified: Secondary | ICD-10-CM | POA: Diagnosis not present

## 2015-05-12 DIAGNOSIS — Z79899 Other long term (current) drug therapy: Secondary | ICD-10-CM | POA: Diagnosis not present

## 2015-05-12 DIAGNOSIS — K5900 Constipation, unspecified: Secondary | ICD-10-CM | POA: Diagnosis present

## 2015-05-12 DIAGNOSIS — I248 Other forms of acute ischemic heart disease: Secondary | ICD-10-CM | POA: Diagnosis not present

## 2015-05-12 DIAGNOSIS — M25559 Pain in unspecified hip: Secondary | ICD-10-CM | POA: Diagnosis present

## 2015-05-12 DIAGNOSIS — R0602 Shortness of breath: Secondary | ICD-10-CM

## 2015-05-12 DIAGNOSIS — E1129 Type 2 diabetes mellitus with other diabetic kidney complication: Secondary | ICD-10-CM

## 2015-05-12 DIAGNOSIS — R7989 Other specified abnormal findings of blood chemistry: Secondary | ICD-10-CM | POA: Diagnosis not present

## 2015-05-12 DIAGNOSIS — E1122 Type 2 diabetes mellitus with diabetic chronic kidney disease: Secondary | ICD-10-CM | POA: Diagnosis present

## 2015-05-12 DIAGNOSIS — Z720 Tobacco use: Secondary | ICD-10-CM | POA: Diagnosis present

## 2015-05-12 LAB — COMPREHENSIVE METABOLIC PANEL
ALT: 15 U/L (ref 14–54)
AST: 31 U/L (ref 15–41)
Albumin: 3.2 g/dL — ABNORMAL LOW (ref 3.5–5.0)
Alkaline Phosphatase: 113 U/L (ref 38–126)
Anion gap: 12 (ref 5–15)
BUN: 22 mg/dL — ABNORMAL HIGH (ref 6–20)
CHLORIDE: 102 mmol/L (ref 101–111)
CO2: 22 mmol/L (ref 22–32)
Calcium: 8.5 mg/dL — ABNORMAL LOW (ref 8.9–10.3)
Creatinine, Ser: 1.62 mg/dL — ABNORMAL HIGH (ref 0.44–1.00)
GFR calc Af Amer: 38 mL/min — ABNORMAL LOW (ref >60)
GFR calc non Af Amer: 33 mL/min — ABNORMAL LOW (ref >60)
GLUCOSE: 369 mg/dL — AB (ref 65–99)
Potassium: 4.6 mmol/L (ref 3.5–5.1)
Sodium: 136 mmol/L (ref 135–145)
TOTAL PROTEIN: 6.7 g/dL (ref 6.5–8.1)
Total Bilirubin: 1.1 mg/dL (ref 0.3–1.2)

## 2015-05-12 LAB — TROPONIN I
Troponin I: 0.03 ng/mL (ref <0.031)
Troponin I: 0.04 ng/mL — ABNORMAL HIGH (ref <0.031)
Troponin I: 0.07 ng/mL — ABNORMAL HIGH (ref <0.031)
Troponin I: 0.06 ng/mL — ABNORMAL HIGH (ref <0.031)

## 2015-05-12 LAB — CBC WITH DIFFERENTIAL/PLATELET
BASOS ABS: 0 10*3/uL (ref 0.0–0.1)
Basophils Relative: 0 % (ref 0–1)
Eosinophils Absolute: 0.2 10*3/uL (ref 0.0–0.7)
Eosinophils Relative: 3 % (ref 0–5)
HCT: 41.3 % (ref 36.0–46.0)
HEMOGLOBIN: 12.6 g/dL (ref 12.0–15.0)
LYMPHS ABS: 4 10*3/uL (ref 0.7–4.0)
Lymphocytes Relative: 54 % — ABNORMAL HIGH (ref 12–46)
MCH: 23.7 pg — ABNORMAL LOW (ref 26.0–34.0)
MCHC: 30.5 g/dL (ref 30.0–36.0)
MCV: 77.6 fL — ABNORMAL LOW (ref 78.0–100.0)
MONO ABS: 0.5 10*3/uL (ref 0.1–1.0)
MONOS PCT: 7 % (ref 3–12)
Neutro Abs: 2.6 10*3/uL (ref 1.7–7.7)
Neutrophils Relative %: 36 % — ABNORMAL LOW (ref 43–77)
PLATELETS: 407 10*3/uL — AB (ref 150–400)
RBC: 5.32 MIL/uL — ABNORMAL HIGH (ref 3.87–5.11)
RDW: 18.7 % — ABNORMAL HIGH (ref 11.5–15.5)
WBC: 7.3 10*3/uL (ref 4.0–10.5)

## 2015-05-12 LAB — URINALYSIS, ROUTINE W REFLEX MICROSCOPIC
Bilirubin Urine: NEGATIVE
GLUCOSE, UA: 250 mg/dL — AB
Hgb urine dipstick: NEGATIVE
Ketones, ur: NEGATIVE mg/dL
NITRITE: NEGATIVE
PROTEIN: NEGATIVE mg/dL
SPECIFIC GRAVITY, URINE: 1.008 (ref 1.005–1.030)
UROBILINOGEN UA: 0.2 mg/dL (ref 0.0–1.0)
pH: 5 (ref 5.0–8.0)

## 2015-05-12 LAB — GLUCOSE, CAPILLARY
GLUCOSE-CAPILLARY: 370 mg/dL — AB (ref 65–99)
GLUCOSE-CAPILLARY: 388 mg/dL — AB (ref 65–99)
Glucose-Capillary: 364 mg/dL — ABNORMAL HIGH (ref 65–99)
Glucose-Capillary: 245 mg/dL — ABNORMAL HIGH (ref 65–99)

## 2015-05-12 LAB — I-STAT ARTERIAL BLOOD GAS, ED
Acid-base deficit: 4 mmol/L — ABNORMAL HIGH (ref 0.0–2.0)
Bicarbonate: 22.1 mEq/L (ref 20.0–24.0)
O2 SAT: 100 %
PH ART: 7.301 — AB (ref 7.350–7.450)
PO2 ART: 276 mmHg — AB (ref 80.0–100.0)
Patient temperature: 98.6
TCO2: 23 mmol/L (ref 0–100)
pCO2 arterial: 44.8 mmHg (ref 35.0–45.0)

## 2015-05-12 LAB — MRSA PCR SCREENING: MRSA by PCR: NEGATIVE

## 2015-05-12 LAB — RAPID URINE DRUG SCREEN, HOSP PERFORMED
Amphetamines: NOT DETECTED
BARBITURATES: NOT DETECTED
Benzodiazepines: NOT DETECTED
COCAINE: NOT DETECTED
Opiates: NOT DETECTED
Tetrahydrocannabinol: NOT DETECTED

## 2015-05-12 LAB — URINE MICROSCOPIC-ADD ON

## 2015-05-12 LAB — PROTIME-INR
INR: 0.98 (ref 0.00–1.49)
Prothrombin Time: 13.1 seconds (ref 11.6–15.2)

## 2015-05-12 LAB — BRAIN NATRIURETIC PEPTIDE: B Natriuretic Peptide: 439.6 pg/mL — ABNORMAL HIGH (ref 0.0–100.0)

## 2015-05-12 LAB — TSH: TSH: 3.659 u[IU]/mL (ref 0.350–4.500)

## 2015-05-12 LAB — MAGNESIUM: Magnesium: 2.1 mg/dL (ref 1.7–2.4)

## 2015-05-12 MED ORDER — SENNOSIDES-DOCUSATE SODIUM 8.6-50 MG PO TABS
1.0000 | ORAL_TABLET | Freq: Two times a day (BID) | ORAL | Status: DC
  Administered 2015-05-12 – 2015-05-14 (×5): 1 via ORAL
  Filled 2015-05-12 (×6): qty 1

## 2015-05-12 MED ORDER — INSULIN ASPART 100 UNIT/ML ~~LOC~~ SOLN
0.0000 [IU] | Freq: Three times a day (TID) | SUBCUTANEOUS | Status: DC
  Administered 2015-05-12 (×2): 15 [IU] via SUBCUTANEOUS
  Administered 2015-05-13: 3 [IU] via SUBCUTANEOUS
  Administered 2015-05-13: 5 [IU] via SUBCUTANEOUS
  Administered 2015-05-13: 3 [IU] via SUBCUTANEOUS
  Administered 2015-05-14: 2 [IU] via SUBCUTANEOUS
  Administered 2015-05-14: 3 [IU] via SUBCUTANEOUS
  Administered 2015-05-14: 2 [IU] via SUBCUTANEOUS
  Administered 2015-05-15: 3 [IU] via SUBCUTANEOUS
  Administered 2015-05-16: 5 [IU] via SUBCUTANEOUS
  Administered 2015-05-17: 2 [IU] via SUBCUTANEOUS
  Administered 2015-05-17: 3 [IU] via SUBCUTANEOUS

## 2015-05-12 MED ORDER — ISOSORBIDE MONONITRATE ER 30 MG PO TB24
30.0000 mg | ORAL_TABLET | Freq: Every day | ORAL | Status: DC
  Administered 2015-05-12 – 2015-05-17 (×5): 30 mg via ORAL
  Filled 2015-05-12 (×6): qty 1

## 2015-05-12 MED ORDER — IPRATROPIUM-ALBUTEROL 0.5-2.5 (3) MG/3ML IN SOLN: 3.0000 mL | RESPIRATORY_TRACT | Status: DC

## 2015-05-12 MED ORDER — TRAMADOL HCL 50 MG PO TABS
50.0000 mg | ORAL_TABLET | Freq: Three times a day (TID) | ORAL | Status: DC
  Administered 2015-05-12 – 2015-05-17 (×17): 50 mg via ORAL
  Filled 2015-05-12 (×17): qty 1

## 2015-05-12 MED ORDER — METHYLPREDNISOLONE SODIUM SUCC 125 MG IJ SOLR
125.0000 mg | Freq: Once | INTRAMUSCULAR | Status: AC
  Administered 2015-05-12: 125 mg via INTRAVENOUS
  Filled 2015-05-12: qty 2

## 2015-05-12 MED ORDER — METOPROLOL TARTRATE 25 MG PO TABS
25.0000 mg | ORAL_TABLET | Freq: Two times a day (BID) | ORAL | Status: DC
  Administered 2015-05-12 – 2015-05-17 (×10): 25 mg via ORAL
  Filled 2015-05-12 (×12): qty 1

## 2015-05-12 MED ORDER — INSULIN ASPART 100 UNIT/ML ~~LOC~~ SOLN
0.0000 [IU] | Freq: Four times a day (QID) | SUBCUTANEOUS | Status: DC
  Administered 2015-05-12: 15 [IU] via SUBCUTANEOUS

## 2015-05-12 MED ORDER — ALBUTEROL (5 MG/ML) CONTINUOUS INHALATION SOLN
10.0000 mg/h | INHALATION_SOLUTION | Freq: Once | RESPIRATORY_TRACT | Status: AC
  Administered 2015-05-12: 10 mg/h via RESPIRATORY_TRACT
  Filled 2015-05-12: qty 20

## 2015-05-12 MED ORDER — SODIUM CHLORIDE 0.9 % IJ SOLN
3.0000 mL | Freq: Two times a day (BID) | INTRAMUSCULAR | Status: DC
  Administered 2015-05-12 – 2015-05-17 (×11): 3 mL via INTRAVENOUS

## 2015-05-12 MED ORDER — INSULIN GLARGINE 100 UNIT/ML ~~LOC~~ SOLN
20.0000 [IU] | Freq: Every day | SUBCUTANEOUS | Status: DC
  Administered 2015-05-12: 20 [IU] via SUBCUTANEOUS
  Filled 2015-05-12 (×2): qty 0.2

## 2015-05-12 MED ORDER — ASPIRIN EC 325 MG PO TBEC
325.0000 mg | DELAYED_RELEASE_TABLET | Freq: Every day | ORAL | Status: DC
  Administered 2015-05-12 – 2015-05-17 (×6): 325 mg via ORAL
  Filled 2015-05-12 (×6): qty 1

## 2015-05-12 MED ORDER — HYDRALAZINE HCL 25 MG PO TABS
25.0000 mg | ORAL_TABLET | Freq: Three times a day (TID) | ORAL | Status: DC
  Administered 2015-05-12 – 2015-05-13 (×4): 25 mg via ORAL
  Filled 2015-05-12 (×7): qty 1

## 2015-05-12 MED ORDER — ONDANSETRON HCL 4 MG/2ML IJ SOLN: 4.0000 mg | Freq: Four times a day (QID) | INTRAMUSCULAR | Status: DC | PRN

## 2015-05-12 MED ORDER — FUROSEMIDE 10 MG/ML IJ SOLN
40.0000 mg | Freq: Once | INTRAMUSCULAR | Status: AC
  Administered 2015-05-12: 40 mg via INTRAVENOUS
  Filled 2015-05-12: qty 4

## 2015-05-12 MED ORDER — CETYLPYRIDINIUM CHLORIDE 0.05 % MT LIQD
7.0000 mL | Freq: Two times a day (BID) | OROMUCOSAL | Status: DC
  Administered 2015-05-12 – 2015-05-17 (×10): 7 mL via OROMUCOSAL

## 2015-05-12 MED ORDER — BRINZOLAMIDE 1 % OP SUSP
1.0000 [drp] | Freq: Three times a day (TID) | OPHTHALMIC | Status: DC
  Administered 2015-05-12 – 2015-05-17 (×15): 1 [drp] via OPHTHALMIC
  Filled 2015-05-12: qty 10

## 2015-05-12 MED ORDER — SODIUM CHLORIDE 0.9 % IJ SOLN: 3.0000 mL | INTRAMUSCULAR | Status: DC | PRN

## 2015-05-12 MED ORDER — IPRATROPIUM-ALBUTEROL 0.5-2.5 (3) MG/3ML IN SOLN: 3.0000 mL | Freq: Four times a day (QID) | RESPIRATORY_TRACT | Status: DC | PRN

## 2015-05-12 MED ORDER — FUROSEMIDE 10 MG/ML IJ SOLN
60.0000 mg | Freq: Every day | INTRAMUSCULAR | Status: DC
  Administered 2015-05-12: 60 mg via INTRAVENOUS
  Filled 2015-05-12: qty 6

## 2015-05-12 MED ORDER — FUROSEMIDE 40 MG PO TABS
40.0000 mg | ORAL_TABLET | Freq: Every day | ORAL | Status: DC
Start: 2015-05-12 — End: 2015-05-16
  Administered 2015-05-12 – 2015-05-16 (×5): 40 mg via ORAL
  Filled 2015-05-12 (×5): qty 1

## 2015-05-12 MED ORDER — LORATADINE 10 MG PO TABS
10.0000 mg | ORAL_TABLET | Freq: Every day | ORAL | Status: DC
  Administered 2015-05-12 – 2015-05-17 (×6): 10 mg via ORAL
  Filled 2015-05-12 (×6): qty 1

## 2015-05-12 MED ORDER — LATANOPROST 0.005 % OP SOLN
1.0000 [drp] | Freq: Every day | OPHTHALMIC | Status: DC
  Administered 2015-05-12 – 2015-05-16 (×5): 1 [drp] via OPHTHALMIC
  Filled 2015-05-12: qty 2.5

## 2015-05-12 MED ORDER — GABAPENTIN 300 MG PO CAPS
300.0000 mg | ORAL_CAPSULE | Freq: Every day | ORAL | Status: DC
  Administered 2015-05-12 – 2015-05-16 (×5): 300 mg via ORAL
  Filled 2015-05-12 (×6): qty 1

## 2015-05-12 MED ORDER — SODIUM CHLORIDE 0.9 % IV SOLN: 250.0000 mL | INTRAVENOUS | Status: DC | PRN

## 2015-05-12 MED ORDER — PRAVASTATIN SODIUM 40 MG PO TABS
40.0000 mg | ORAL_TABLET | Freq: Every day | ORAL | Status: DC
  Administered 2015-05-12 – 2015-05-16 (×5): 40 mg via ORAL
  Filled 2015-05-12 (×6): qty 1

## 2015-05-12 MED ORDER — MOMETASONE FURO-FORMOTEROL FUM 100-5 MCG/ACT IN AERO
2.0000 | INHALATION_SPRAY | Freq: Two times a day (BID) | RESPIRATORY_TRACT | Status: DC
  Administered 2015-05-12 – 2015-05-17 (×9): 2 via RESPIRATORY_TRACT
  Filled 2015-05-12 (×2): qty 8.8

## 2015-05-12 MED ORDER — ACETAMINOPHEN 325 MG PO TABS: 650.0000 mg | ORAL_TABLET | ORAL | Status: DC | PRN

## 2015-05-12 MED ORDER — POLYETHYLENE GLYCOL 3350 17 G PO PACK
17.0000 g | PACK | Freq: Every day | ORAL | Status: DC
  Administered 2015-05-12 – 2015-05-17 (×5): 17 g via ORAL
  Filled 2015-05-12 (×6): qty 1

## 2015-05-12 MED ORDER — HEPARIN SODIUM (PORCINE) 5000 UNIT/ML IJ SOLN
5000.0000 [IU] | Freq: Three times a day (TID) | INTRAMUSCULAR | Status: DC
  Administered 2015-05-12 – 2015-05-17 (×16): 5000 [IU] via SUBCUTANEOUS
  Filled 2015-05-12 (×19): qty 1

## 2015-05-12 NOTE — Consult Note (Signed)
CARDIOLOGY CONSULT NOTE  Patient ID: [name] [name] MRN: [id number] DOB/AGE: 1951/07/30 64 y.o.  Admit date: 05/12/2015 Primary Physician Marlou Sa ERIC, MD Primary Cardiologist   Dr. Martinique Chief Complaint  Dyspnea  HPI:  The patient presented with acute SOB.  She says that she had been feeling OK.  However, she got up to use the bathroom this morning and when she got back to the couch, where she sleeps, she was SOB.  Her cousin told her she was wheezing and so they called EMS.  She was apparently found to have sats in the 70s when they arrived.  Her heart rate was reported to be 150s.  She was treated with "breathing treatment" and had improvement.  In the ED she had BiPap and nebulizer and solumedrol.  She was also given 60 mg of IV Lasix. Her troponin was minimally elevated and BNP was mildly increased. She says that now she "doesn't feel like a million dollars but she at least feels like 25,000."  She otherwise had been doing OK.  She gets around with a walker since a previous stroke.  However, she can do activities such as vacuuming and shopping without recent acute complaints.  The patient denies any new symptoms such as chest discomfort, neck or arm discomfort. There has been no new shortness of breath, PND or orthopnea. There have been no reported palpitations, presyncope or syncope.  She has been under stress at home.    Past Medical History  Diagnosis Date  . Hypertension   . Diabetes mellitus     diagnosed in 2008  . Glaucoma   . Hyperlipidemia   . CVA (cerebral infarction)     right internal capsule stroke in 12/2006  . Left-sided sensory deficit present   . Cocaine abuse     crack cocaine heavily until 2008 then sporatic use since then  . Tobacco abuse   . Thyroid nodule     FNA in [id number] showed follicular cells but not definate neoplasm  . Coronary artery disease   . Myocardial infarction   . Anginal pain   . Stroke   . Arthritis of left shoulder region 03/23/2013    Past  Surgical History  Procedure Laterality Date  . Coronary artery bypass graft  07/09/2012    Procedure: CORONARY ARTERY BYPASS GRAFTING (CABG);  Surgeon: Ivin Poot, MD;  Location: [place];  Service: Open Heart Surgery;  Laterality: N/A;  . Cardiac catheterization    . Sternal wound debridement  08/17/2012    Procedure: STERNAL WOUND DEBRIDEMENT;  Surgeon: Ivin Poot, MD;  Location: [facility];  Service: Thoracic;  Laterality: N/A;  wound vac application  . Sternal wound debridement  08/24/2012    Procedure: STERNAL WOUND DEBRIDEMENT;  Surgeon: Ivin Poot, MD;  Location: [place];  Service: Thoracic;  Laterality: N/A;  . Sternal wound debridement  09/01/2012    Procedure: STERNAL WOUND DEBRIDEMENT;  Surgeon: Ivin Poot, MD;  Location: [place];  Service: Thoracic;  Laterality: N/A;  . Sternal wound debridement  09/20/2012    Procedure: STERNAL WOUND DEBRIDEMENT;  Surgeon: Ivin Poot, MD;  Location: [facility] OR;  Service: Thoracic;  Laterality: N/A;  wound vac change  . Left heart catheterization with coronary angiogram N/A 06/29/2012    Procedure: LEFT HEART CATHETERIZATION WITH CORONARY ANGIOGRAM;  Surgeon: Peter M Martinique, MD;  Location: [facility] CATH LAB;  Service: Cardiovascular;  Laterality: N/A;    Allergies  Allergen Reactions  . Naproxen  rash   Facility-administered medications prior to admission  Medication Dose Route Frequency Provider Last Rate Last Dose  . loratadine (CLARITIN) tablet 10 mg  10 mg Oral Daily Ivin Poot, MD       Prescriptions prior to admission  Medication Sig Dispense Refill Last Dose  . aspirin 325 MG EC tablet Take 325 mg by mouth daily.   05/11/2015 at Unknown time  . brinzolamide (AZOPT) 1 % ophthalmic suspension Place 1 drop into both eyes 3 (three) times daily.   05/10/2015  . cetirizine (ZYRTEC) 10 MG tablet Take 10 mg by mouth daily as needed.    05/10/2015  . Fluticasone-Salmeterol (ADVAIR) 250-50 MCG/DOSE AEPB Inhale 1 puff into the lungs every 12  (twelve) hours.   05/12/2015 at Unknown time  . furosemide (LASIX) 40 MG tablet Take 40 mg by mouth daily.   05/10/2015  . gabapentin (NEURONTIN) 100 MG capsule Take 300 mg by mouth at bedtime.    05/10/2015  . glimepiride (AMARYL) 4 MG tablet Take 4 mg by mouth daily before breakfast.   05/10/2015  . latanoprost (XALATAN) 0.005 % ophthalmic solution Place 1 drop into both eyes at bedtime.   05/10/2015 at Unknown time  . lisinopril (PRINIVIL,ZESTRIL) 5 MG tablet Take 5 mg by mouth daily.   05/10/2015  . metFORMIN (GLUCOPHAGE) 500 MG tablet Take 1,000 mg by mouth 2 (two) times daily with a meal.   05/10/2015  . metoprolol tartrate (LOPRESSOR) 25 MG tablet Take 1 tablet (25 mg total) by mouth 2 (two) times daily.   05/10/2015 at 2000  . pravastatin (PRAVACHOL) 40 MG tablet Take 40 mg by mouth at bedtime.   05/10/2015  . Cyanocobalamin (VITAMIN B-12 PO) Take 1 tablet by mouth daily.   Not Taking at Unknown time  . traMADol (ULTRAM) 50 MG tablet Take 50 mg by mouth 3 (three) times daily.    Not Taking at Unknown time   Family History  Problem Relation Age of Onset  . Other      no known family CAD  . Diabetes Mother     History   Social History  . Marital Status: Single    Spouse Name: N/A  . Number of Children: N/A  . Years of Education: N/A   Occupational History  . Not on file.   Social History Main Topics  . Smoking status: Current Every Day Smoker -- 0.25 packs/day  . Smokeless tobacco: Not on file  . Alcohol Use: No  . Drug Use: Yes    Special: Cocaine  . Sexual Activity: No   Other Topics Concern  . Not on file   Social History Narrative     ROS:  Constipation  As stated in the HPI and negative for all other systems.  Physical Exam: Blood pressure 122/70, pulse 77, temperature 98.1 F (36.7 C), temperature source Oral, resp. rate 21, height 5\' 5"  (1.651 m), weight 270 lb 15.1 oz (122.9 kg), SpO2 98 %.  GENERAL:  Well appearing HEENT:  Pupils equal round and reactive, fundi  not visualized, oral mucosa unremarkable NECK:  No jugular venous distention, waveform within normal limits, carotid upstroke brisk and symmetric, no bruits, no thyromegaly LYMPHATICS:  No cervical, inguinal adenopathy LUNGS:  Clear to auscultation bilaterally BACK:  No CVA tenderness CHEST:  Well healed sternotomy scar.  HEART:  PMI not displaced or sustained,S1 and S2 within normal limits, no S3, no S4, no clicks, no rubs, no murmurs ABD:  Flat, positive bowel sounds  normal in frequency in pitch, no bruits, no rebound, no guarding, no midline pulsatile mass, no hepatomegaly, no splenomegaly EXT:  2 plus pulses throughout, no edema, no cyanosis no clubbing SKIN:  No rashes no nodules NEURO:  Cranial nerves II through XII grossly intact, motor grossly intact throughout PSYCH:  Cognitively intact, oriented to person place and time   Labs: Lab Results  Component Value Date   BUN 22* 05/12/2015   Lab Results  Component Value Date   CREATININE 1.62* 05/12/2015   Lab Results  Component Value Date   NA 136 05/12/2015   K 4.6 05/12/2015   CL 102 05/12/2015   CO2 22 05/12/2015   Lab Results  Component Value Date   TROPONINI 0.04* 05/12/2015   Lab Results  Component Value Date   WBC 7.3 05/12/2015   HGB 12.6 05/12/2015   HCT 41.3 05/12/2015   MCV 77.6* 05/12/2015   PLT 407* 05/12/2015    Lab Results  Component Value Date   ALT 15 05/12/2015   AST 31 05/12/2015   ALKPHOS 113 05/12/2015   BILITOT 1.1 05/12/2015      Radiology:   CXR: Postoperative changes in the mediastinum. Mild cardiac enlargement with increased pulmonary vascularity. Perihilar interstitial infiltrates consistent with edema. This is progressing since previous study. Also there is interval development of focal consolidation in the right lung base which may indicate superimposed pneumonia.  EKG:   Sinus tachycardia, rate 123, lateral T wave inversion unchanged from previous.    ASSESSMENT AND PLAN:     ACUTE ON CHRONIC SYSTOLIC HF:   I suspect that there was a component of heart failure.  However, she also has some worsening renal insufficiency.  Given her quick response I would agree with checking the echo.  I would convert to PO diuretic in the AM.  Continue other meds as listed.   CKD:   Her creat is up from her baseline mildly.  We can hold her ACE inhibitor for now as we follow her creat.    CAD:  Elevated troponin is non diagnostic.  I do not think that she has an acute coronary syndrome.    HTN:  BP is improved after the initial presentation.  Likely I will suggest continuing her previous meds after seeing the BMET in the AM.   Signed: Minus Breeding 05/12/2015, 12:07 PM

## 2015-05-12 NOTE — Progress Notes (Signed)
Pt's CBG's in 300s all day.  Dr. [name] notified.  Orders received. Will continue to monitor.

## 2015-05-12 NOTE — ED Provider Notes (Signed)
CSN: [id number]     Arrival date & time 05/12/15  0356 History   First MD Initiated Contact with Patient 05/12/15 0357     Chief Complaint  Patient presents with  . Respiratory Distress     (Consider location/radiation/quality/duration/timing/severity/associated sxs/prior Treatment) HPI Patient presents with progressive shortness of breath for the last 2 hours. She states she is been wheezing and used her inhaler with some relief. EMS was called and patient was found to be hypoxic with saturations in the 70s. Heart rates in the 150s. Patient found in respiratory distress tripoding. Given another breathing treatment en route with some improvement of symptoms. Patient denies any cough or chest pain. States shortness of breath is worse with lying flat. No new lower extremity swelling or pain. Past Medical History  Diagnosis Date  . Hypertension   . Diabetes mellitus     diagnosed in 2008  . Glaucoma   . Hyperlipidemia   . CVA (cerebral infarction)     right internal capsule stroke in 12/2006  . Left-sided sensory deficit present   . Cocaine abuse     crack cocaine heavily until 2008 then sporatic use since then  . Tobacco abuse   . Thyroid nodule     FNA in [id number] showed follicular cells but not definate neoplasm  . Coronary artery disease   . Myocardial infarction   . 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 Coronary artery bypass graft  07/09/2012    Procedure: CORONARY ARTERY BYPASS GRAFTING (CABG);  Surgeon: Ivin Poot, MD;  Location: [place];  Service: Open Heart Surgery;  Laterality: N/A;  . Cardiac catheterization    . Sternal wound debridement  08/17/2012    Procedure: STERNAL WOUND DEBRIDEMENT;  Surgeon: Ivin Poot, MD;  Location: [place];  Service: Thoracic;  Laterality: N/A;  wound vac application  . Sternal wound debridement  08/24/2012    Procedure: STERNAL WOUND DEBRIDEMENT;  Surgeon: Ivin Poot, MD;   Location: [place];  Service: Thoracic;  Laterality: N/A;  . Sternal wound debridement  09/01/2012    Procedure: STERNAL WOUND DEBRIDEMENT;  Surgeon: Ivin Poot, MD;  Location: [place];  Service: Thoracic;  Laterality: N/A;  . Sternal wound debridement  09/20/2012    Procedure: STERNAL WOUND DEBRIDEMENT;  Surgeon: Ivin Poot, MD;  Location: [facility] OR;  Service: Thoracic;  Laterality: N/A;  wound vac change  . Left heart catheterization with coronary angiogram N/A 06/29/2012    Procedure: LEFT HEART CATHETERIZATION WITH CORONARY ANGIOGRAM;  Surgeon: Peter M Martinique, MD;  Location: [facility] CATH LAB;  Service: Cardiovascular;  Laterality: N/A;   Family History  Problem Relation Age of Onset  . Other      no known family CAD  . Diabetes Mother    History  Substance Use Topics  . Smoking status: Current Every Day Smoker -- 0.25 packs/day  . Smokeless tobacco: Not on file  . Alcohol Use: No   OB History    No data available     Review of Systems  Constitutional: Negative for fever and chills.  Respiratory: Positive for shortness of breath and wheezing. Negative for cough.   Cardiovascular: Negative for chest pain, palpitations and leg swelling.  Gastrointestinal: Negative for nausea, vomiting and abdominal pain.  Skin: Negative for rash and wound.  Neurological: Negative for dizziness, weakness, light-headedness, numbness and headaches.  All other systems reviewed and are negative.  Allergies  Naproxen  Home Medications   Prior to Admission medications   Medication Sig Start Date End Date Taking? Authorizing Provider  aspirin 325 MG EC tablet Take 325 mg by mouth daily.   Yes Historical Provider, MD  brinzolamide (AZOPT) 1 % ophthalmic suspension Place 1 drop into both eyes 3 (three) times daily.   Yes Historical Provider, MD  cetirizine (ZYRTEC) 10 MG tablet Take 10 mg by mouth daily as needed.    Yes Historical Provider, MD  Fluticasone-Salmeterol (ADVAIR) 250-50 MCG/DOSE AEPB  Inhale 1 puff into the lungs every 12 (twelve) hours.   Yes Historical Provider, MD  furosemide (LASIX) 40 MG tablet Take 40 mg by mouth daily.   Yes Historical Provider, MD  gabapentin (NEURONTIN) 100 MG capsule Take 300 mg by mouth at bedtime.    Yes Historical Provider, MD  glimepiride (AMARYL) 4 MG tablet Take 4 mg by mouth daily before breakfast.   Yes Historical Provider, MD  latanoprost (XALATAN) 0.005 % ophthalmic solution Place 1 drop into both eyes at bedtime.   Yes Historical Provider, MD  lisinopril (PRINIVIL,ZESTRIL) 5 MG tablet Take 5 mg by mouth daily.   Yes Historical Provider, MD  metFORMIN (GLUCOPHAGE) 500 MG tablet Take 1,000 mg by mouth 2 (two) times daily with a meal.   Yes Historical Provider, MD  metoprolol tartrate (LOPRESSOR) 25 MG tablet Take 1 tablet (25 mg total) by mouth 2 (two) times daily. 08/22/12  Yes [name], PA-C  pravastatin (PRAVACHOL) 40 MG tablet Take 40 mg by mouth at bedtime.   Yes Historical Provider, MD  Cyanocobalamin (VITAMIN B-12 PO) Take 1 tablet by mouth daily.    Historical Provider, MD  traMADol (ULTRAM) 50 MG tablet Take 50 mg by mouth 3 (three) times daily.     Historical Provider, MD   BP 138/95 mmHg  Pulse 82  Temp(Src) 98.3 F (36.8 C) (Oral)  Resp 17  Ht 5\' 5"  (1.651 m)  Wt 270 lb 15.1 oz (122.9 kg)  BMI 45.09 kg/m2  SpO2 100% Physical Exam  Constitutional: She is oriented to person, place, and time. She appears well-developed and well-nourished. She appears distressed.  HENT:  Head: Normocephalic and atraumatic.  Mouth/Throat: Oropharynx is clear and moist.  Eyes: EOM are normal. Pupils are equal, round, and reactive to light.  Neck: Normal range of motion. Neck supple. No JVD present.  Cardiovascular: Normal rate and regular rhythm.   Pulmonary/Chest: She is in respiratory distress. She has wheezes. She has no rales.  Increased respiratory effort. Prolonged expiratory phase with index or wheezing. Scattered rhonchi in  the bilateral bases.  Abdominal: Soft. Bowel sounds are normal. She exhibits no distension and no mass. There is no tenderness. There is no rebound and no guarding.  Musculoskeletal: Normal range of motion. She exhibits no edema or tenderness.  No calf swelling or tenderness  Neurological: She is alert and oriented to person, place, and time.  Moves all extremities without deficit. Sensation is grossly intact.  Skin: Skin is warm and dry. No rash noted. No erythema.  Psychiatric: She has a normal mood and affect. Her behavior is normal.  Nursing note and vitals reviewed.   ED Course  Procedures (including critical care time) Labs Review Labs Reviewed  CBC WITH DIFFERENTIAL/PLATELET - Abnormal; Notable for the following:    RBC 5.32 (*)    MCV 77.6 (*)    MCH 23.7 (*)    RDW 18.7 (*)    Platelets 407 (*)  Neutrophils Relative % 36 (*)    Lymphocytes Relative 54 (*)    All other components within normal limits  BRAIN NATRIURETIC PEPTIDE - Abnormal; Notable for the following:    B Natriuretic Peptide 439.6 (*)    All other components within normal limits  COMPREHENSIVE METABOLIC PANEL - Abnormal; Notable for the following:    Glucose, Bld 369 (*)    BUN 22 (*)    Creatinine, Ser 1.62 (*)    Calcium 8.5 (*)    Albumin 3.2 (*)    GFR calc non Af Amer 33 (*)    GFR calc Af Amer 38 (*)    All other components within normal limits  TROPONIN I - Abnormal; Notable for the following:    Troponin I 0.04 (*)    All other components within normal limits  TROPONIN I - Abnormal; Notable for the following:    Troponin I 0.07 (*)    All other components within normal limits  TROPONIN I - Abnormal; Notable for the following:    Troponin I 0.06 (*)    All other components within normal limits  GLUCOSE, CAPILLARY - Abnormal; Notable for the following:    Glucose-Capillary 370 (*)    All other components within normal limits  URINALYSIS, ROUTINE W REFLEX MICROSCOPIC - Abnormal; Notable  for the following:    Glucose, UA 250 (*)    Leukocytes, UA MODERATE (*)    All other components within normal limits  URINE MICROSCOPIC-ADD ON - Abnormal; Notable for the following:    Squamous Epithelial / LPF FEW (*)    Bacteria, UA FEW (*)    All other components within normal limits  GLUCOSE, CAPILLARY - Abnormal; Notable for the following:    Glucose-Capillary 388 (*)    All other components within normal limits  GLUCOSE, CAPILLARY - Abnormal; Notable for the following:    Glucose-Capillary 364 (*)    All other components within normal limits  GLUCOSE, CAPILLARY - Abnormal; Notable for the following:    Glucose-Capillary 245 (*)    All other components within normal limits  I-STAT ARTERIAL BLOOD GAS, ED - Abnormal; Notable for the following:    pH, Arterial 7.301 (*)    pO2, Arterial 276.0 (*)    Acid-base deficit 4.0 (*)    All other components within normal limits  MRSA PCR SCREENING  TROPONIN I  TSH  PROTIME-INR  MAGNESIUM  URINE RAPID DRUG SCREEN (HOSP PERFORMED)  BLOOD GAS, ARTERIAL  HEMOGLOBIN A1C  MAGNESIUM  BASIC METABOLIC PANEL    Imaging Review Dg Chest Portable 1 View  05/12/2015   CLINICAL DATA:  Respiratory distress.  EXAM: PORTABLE CHEST - 1 VIEW  COMPARISON:  10/18/2014  FINDINGS: Postoperative changes in the mediastinum. Mild cardiac enlargement with increased pulmonary vascularity. Perihilar interstitial infiltrates consistent with edema. This is progressing since previous study. Also there is interval development of focal consolidation in the right lung base which may indicate superimposed pneumonia.  IMPRESSION: Progressive congestive changes since previous study with increasing pulmonary edema. Superimposed consolidation in the right lung base may indicate pneumonia.   Electronically Signed   By: Lucienne Capers M.D.   On: 05/12/2015 04:41     EKG Interpretation None      MDM   Final diagnoses:  SOB (shortness of breath)  Acute pulmonary  edema    Place the patient on BiPAP start continuous nebulized treatment and give Solu-Medrol. Likely bronchospasm/COPD exacerbation. There may be an element of congestive heart  failure.  Patient feeling better after initial treatment and BiPAP. Able to wean off BiPAP in the emergency department. Discuss with [facility] and will admit.  Julianne Rice, MD 05/12/15 2322

## 2015-05-12 NOTE — H&P (Signed)
[facility] History and Physical  Patient: [name] [name]  MRN: [id number]  DOB: 08-28-1951  DOS: the patient was seen and examined on 05/12/2015 PCP: Marlou Sa ERIC, MD  Referring physician: Dr. Lita Mains Chief Complaint: Shortness of breath  HPI: [name] [name] is a 64 y.o. female with Past medical history of hypertension, coronary artery disease status post CABG, history of CVA, dyslipidemia, diabetes mellitus, chronic kidney disease likely secondary to diabetes, history of substance abuse. The patient is presenting with complaints of shortness of breath with cough that started suddenly this afternoon. The patient mentions that she woke up from the middle of the night went to the restroom and while she was walking back she started having shortness of breath and was trying to use her nebulizer but then her breathing was progressively worsening and therefore she requested to be brought to the hospital. There is no chest pain or chest fever no chills. Patient has cough with yellow expectoration. Patient has no worsening of leg swelling. Denies any diarrhea and actually complains of constipation without any bowel movement since last 4 days. Does not have any burning urination. Patient claims she is been taking all her medications regularly. Patient has been having a lot of stress in life recently. Patient actively smoking denies any alcohol or drug abuse at present.  The patient is coming from home. And at her baseline independent for most of her ADL.  Review of Systems: as mentioned in the history of present illness.  A comprehensive review of the other systems is negative.  Past Medical History  Diagnosis Date  . Hypertension   . Diabetes mellitus     diagnosed in 2008  . Glaucoma   . Hyperlipidemia   . CVA (cerebral infarction)     right internal capsule stroke in 12/2006  . Left-sided sensory deficit present   . Cocaine abuse     crack cocaine heavily until 2008 then sporatic  use since then  . Tobacco abuse   . Thyroid nodule     FNA in [id number] showed follicular cells but not definate neoplasm  . Coronary artery disease   . Myocardial infarction   . Anginal pain   . Stroke   . 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 Coronary artery bypass graft  07/09/2012    Procedure: CORONARY ARTERY BYPASS GRAFTING (CABG);  Surgeon: Ivin Poot, MD;  Location: [place];  Service: Open Heart Surgery;  Laterality: N/A;  . Cardiac catheterization    . Sternal wound debridement  08/17/2012    Procedure: STERNAL WOUND DEBRIDEMENT;  Surgeon: Ivin Poot, MD;  Location: [place];  Service: Thoracic;  Laterality: N/A;  wound vac application  . Sternal wound debridement  08/24/2012    Procedure: STERNAL WOUND DEBRIDEMENT;  Surgeon: Ivin Poot, MD;  Location: [place];  Service: Thoracic;  Laterality: N/A;  . Sternal wound debridement  09/01/2012    Procedure: STERNAL WOUND DEBRIDEMENT;  Surgeon: Ivin Poot, MD;  Location: [place];  Service: Thoracic;  Laterality: N/A;  . Sternal wound debridement  09/20/2012    Procedure: STERNAL WOUND DEBRIDEMENT;  Surgeon: Ivin Poot, MD;  Location: [facility] OR;  Service: Thoracic;  Laterality: N/A;  wound vac change  . Left heart catheterization with coronary angiogram N/A 06/29/2012    Procedure: LEFT HEART CATHETERIZATION WITH CORONARY ANGIOGRAM;  Surgeon: Peter M Martinique, MD;  Location: [facility] CATH LAB;  Service: Cardiovascular;  Laterality: N/A;   Social History:  reports that she has been smoking.  She does not have any smokeless tobacco history on file. She reports that she uses illicit drugs (Cocaine). She reports that she does not drink alcohol.  Allergies  Allergen Reactions  . Naproxen     rash    Family History  Problem Relation Age of Onset  . Other      no known family CAD  . Diabetes Mother     Prior to Admission medications   Medication Sig Start Date End Date Taking?  Authorizing Provider  aspirin 325 MG EC tablet Take 325 mg by mouth daily.   Yes Historical Provider, MD  brinzolamide (AZOPT) 1 % ophthalmic suspension Place 1 drop into both eyes 3 (three) times daily.   Yes Historical Provider, MD  cetirizine (ZYRTEC) 10 MG tablet Take 10 mg by mouth daily as needed.    Yes Historical Provider, MD  Fluticasone-Salmeterol (ADVAIR) 250-50 MCG/DOSE AEPB Inhale 1 puff into the lungs every 12 (twelve) hours.   Yes Historical Provider, MD  furosemide (LASIX) 40 MG tablet Take 40 mg by mouth daily.   Yes Historical Provider, MD  gabapentin (NEURONTIN) 100 MG capsule Take 300 mg by mouth at bedtime.    Yes Historical Provider, MD  glimepiride (AMARYL) 4 MG tablet Take 4 mg by mouth daily before breakfast.   Yes Historical Provider, MD  latanoprost (XALATAN) 0.005 % ophthalmic solution Place 1 drop into both eyes at bedtime.   Yes Historical Provider, MD  lisinopril (PRINIVIL,ZESTRIL) 5 MG tablet Take 5 mg by mouth daily.   Yes Historical Provider, MD  metFORMIN (GLUCOPHAGE) 500 MG tablet Take 1,000 mg by mouth 2 (two) times daily with a meal.   Yes Historical Provider, MD  metoprolol tartrate (LOPRESSOR) 25 MG tablet Take 1 tablet (25 mg total) by mouth 2 (two) times daily. 08/22/12  Yes [name], PA-C  pravastatin (PRAVACHOL) 40 MG tablet Take 40 mg by mouth at bedtime.   Yes Historical Provider, MD  Cyanocobalamin (VITAMIN B-12 PO) Take 1 tablet by mouth daily.    Historical Provider, MD  traMADol (ULTRAM) 50 MG tablet Take 50 mg by mouth 3 (three) times daily.     Historical Provider, MD    Physical Exam: Filed Vitals:   05/12/15 0445 05/12/15 0457 05/12/15 0515 05/12/15 0526  BP: 125/89  114/65   Pulse: 55  104 102  Temp:      TempSrc:      Resp: 22  21 20   SpO2: 100% 100% 96% 92%    General: Alert, Awake and Oriented to Time, Place and Person. Appear in mild distress Eyes: PERRL ENT: Oral Mucosa clear moist Neck: Difficult to assess  JVD Cardiovascular: S1 and S2 Present, no Murmur, Peripheral Pulses Present Respiratory: Bilateral Air entry equal and Decreased,  Bilateral Crackles, bilateral expiratory wheezes Abdomen: Bowel Sound present, Soft and non- tender Skin: No Rash Extremities: Bilateral Pedal edema, no calf tenderness Neurologic: Grossly no focal neuro deficit.  Labs on Admission:  CBC:  Recent Labs Lab 05/12/15 0404  WBC 7.3  NEUTROABS 2.6  HGB 12.6  HCT 41.3  MCV 77.6*  PLT 407*    CMP     Component Value Date/Time   NA 136 05/12/2015 0404   K 4.6 05/12/2015 0404   CL 102 05/12/2015 0404   CO2 22 05/12/2015 0404   GLUCOSE 369* 05/12/2015 0404   BUN 22* 05/12/2015 0404   CREATININE 1.62*  05/12/2015 0404   CALCIUM 8.5* 05/12/2015 0404   PROT 6.7 05/12/2015 0404   ALBUMIN 3.2* 05/12/2015 0404   AST 31 05/12/2015 0404   ALT 15 05/12/2015 0404   ALKPHOS 113 05/12/2015 0404   BILITOT 1.1 05/12/2015 0404   GFRNONAA 33* 05/12/2015 0404   GFRAA 38* 05/12/2015 0404    No results for input(s): LIPASE, AMYLASE in the last 168 hours.   Recent Labs Lab 05/12/15 0404  TROPONINI 0.03   BNP (last 3 results)  Recent Labs  05/12/15 0404  BNP 439.6*    ProBNP (last 3 results)  Recent Labs  10/18/14 1637  PROBNP 2288.0*     Radiological Exams on Admission: Dg Chest Portable 1 View  05/12/2015   CLINICAL DATA:  Respiratory distress.  EXAM: PORTABLE CHEST - 1 VIEW  COMPARISON:  10/18/2014  FINDINGS: Postoperative changes in the mediastinum. Mild cardiac enlargement with increased pulmonary vascularity. Perihilar interstitial infiltrates consistent with edema. This is progressing since previous study. Also there is interval development of focal consolidation in the right lung base which may indicate superimposed pneumonia.  IMPRESSION: Progressive congestive changes since previous study with increasing pulmonary edema. Superimposed consolidation in the right lung base may indicate  pneumonia.   Electronically Signed   By: Lucienne Capers M.D.   On: 05/12/2015 04:41   EKG: Independently reviewed. normal sinus rhythm, nonspecific ST and T waves changes, sinus tachycardia.  Assessment/Plan Principal Problem:   Acute on chronic combined systolic and diastolic congestive heart failure Active Problems:   Glaucoma   Hypertension   Tobacco abuse   Coronary artery disease   S/P CABG (coronary artery bypass graft)   Type 2 diabetes mellitus   CKD (chronic kidney disease)   1. Acute on chronic combined systolic and diastolic congestive heart failure The patient is presenting with complaint of sudden worsening of shortness of breath. She presents with high blood pressure as well as tachycardia. Appears to be most likely flash pulmonary edema. Symptoms are significantly improved after placing her on BiPAP and giving her Lasix. At present we will continue BiPAP as needed. The patient is tolerating 4 L well. I'll continue with Lasix. Echocardiogram in the morning patient remains nothing by mouth serial troponins telemetry monitoring. Continue metoprolol although the patient may benefit from Toprol-XL. Holding ACE inhibitor in the setting of worsening renal function and placing her on isosorbide and hydralazine.  2. History of COPD. We will continue with her inhalers and place her on duo nebs.  3. History of diabetes mellitus. Check hemoglobin A1c and placing her on sliding scale.  4. Chronic kidney disease most likely secondary to diabetes. Monitor ins and outs and daily weight. Avoid nephrotoxic medications.  5. Chronic hip pain. Continue tramadol and gabapentin.  Advance goals of care discussion: Full code   DVT Prophylaxis: subcutaneous Heparin Nutrition: Nothing by mouth except medications  Disposition: Admitted as inpatient, Stepdown unit.  Author: Berle Mull, MD [facility] Pager: [phone] 05/12/2015  If 7PM-7AM, please contact  night-coverage [email or web address] Password [email or web address]

## 2015-05-12 NOTE — ED Notes (Signed)
Patient found by EMS with oxygen sats of 75, HR of 150's, complaining of respiratory distress, with wheezing, hypertensive.  Patient was given 5mg  albuterol neb by GFD.  Patient very tired.  Patient was in tripod position upon EMS arrival.  Patient was found in room full of cigarette smoke.

## 2015-05-12 NOTE — Progress Notes (Signed)
Pt had 18 beat run SVT.  Asymptomatic, VSS.  Bunnie Domino, NP notified.  Orders received to check magnesium with morning labs.  Will continue to monitor.

## 2015-05-12 NOTE — Progress Notes (Signed)
Attempted to get report. RN will call back

## 2015-05-12 NOTE — Progress Notes (Signed)
RT Note: Pt has been refusing all respiratory treatments. Pt does not appear to be in distress, VS WNL, changed HHN to PRN. RT will continue to monitor.

## 2015-05-12 NOTE — Progress Notes (Signed)
Feeling better, denies cough , denies chest pain, no fever, reported leg edema subsided. Vital stable, lung clear. Awaiting echo, UDS.

## 2015-05-12 NOTE — ED Notes (Signed)
Attempted report rn will calkl me back

## 2015-05-12 NOTE — ED Notes (Signed)
Report given.

## 2015-05-12 NOTE — ED Notes (Signed)
Admitting doctor at the bedside 

## 2015-05-12 NOTE — ED Notes (Signed)
WOB significantly decreased, pt resting comfortably, skin warm and dry.

## 2015-05-12 NOTE — ED Notes (Signed)
The pt reports that she is feeling better.   Her breathing is much better.  No distress

## 2015-05-12 NOTE — ED Notes (Signed)
Repositioned. No pain

## 2015-05-12 NOTE — ED Notes (Signed)
Family at bedside. 

## 2015-05-13 ENCOUNTER — Inpatient Hospital Stay (HOSPITAL_COMMUNITY): Payer: Medicaid Other

## 2015-05-13 DIAGNOSIS — E1165 Type 2 diabetes mellitus with hyperglycemia: Secondary | ICD-10-CM

## 2015-05-13 DIAGNOSIS — I509 Heart failure, unspecified: Secondary | ICD-10-CM

## 2015-05-13 DIAGNOSIS — J810 Acute pulmonary edema: Secondary | ICD-10-CM

## 2015-05-13 LAB — BASIC METABOLIC PANEL
Anion gap: 13 (ref 5–15)
BUN: 32 mg/dL — AB (ref 6–20)
CALCIUM: 8.9 mg/dL (ref 8.9–10.3)
CO2: 23 mmol/L (ref 22–32)
Chloride: 101 mmol/L (ref 101–111)
Creatinine, Ser: 1.7 mg/dL — ABNORMAL HIGH (ref 0.44–1.00)
GFR calc Af Amer: 36 mL/min — ABNORMAL LOW (ref >60)
GFR calc non Af Amer: 31 mL/min — ABNORMAL LOW (ref >60)
GLUCOSE: 220 mg/dL — AB (ref 65–99)
POTASSIUM: 4.3 mmol/L (ref 3.5–5.1)
SODIUM: 137 mmol/L (ref 135–145)

## 2015-05-13 LAB — GLUCOSE, CAPILLARY
GLUCOSE-CAPILLARY: 190 mg/dL — AB (ref 65–99)
GLUCOSE-CAPILLARY: 172 mg/dL — AB (ref 65–99)
Glucose-Capillary: 252 mg/dL — ABNORMAL HIGH (ref 65–99)
Glucose-Capillary: 175 mg/dL — ABNORMAL HIGH (ref 65–99)

## 2015-05-13 LAB — MAGNESIUM: Magnesium: 2 mg/dL (ref 1.7–2.4)

## 2015-05-13 MED ORDER — HYDRALAZINE HCL 50 MG PO TABS
50.0000 mg | ORAL_TABLET | Freq: Three times a day (TID) | ORAL | Status: DC
  Administered 2015-05-13 – 2015-05-17 (×13): 50 mg via ORAL
  Filled 2015-05-13 (×15): qty 1

## 2015-05-13 MED ORDER — MAGNESIUM HYDROXIDE 400 MG/5ML PO SUSP
30.0000 mL | Freq: Every day | ORAL | Status: DC | PRN
  Administered 2015-05-13: 30 mL via ORAL
  Filled 2015-05-13: qty 30

## 2015-05-13 MED ORDER — INSULIN GLARGINE 100 UNIT/ML ~~LOC~~ SOLN
30.0000 [IU] | Freq: Every day | SUBCUTANEOUS | Status: DC
  Administered 2015-05-13 – 2015-05-16 (×4): 30 [IU] via SUBCUTANEOUS
  Filled 2015-05-13 (×6): qty 0.3

## 2015-05-13 MED ORDER — PERFLUTREN LIPID MICROSPHERE
1.0000 mL | INTRAVENOUS | Status: AC | PRN
  Administered 2015-05-13: 2 mL via INTRAVENOUS
  Filled 2015-05-13: qty 10

## 2015-05-13 MED ORDER — PERFLUTREN LIPID MICROSPHERE
INTRAVENOUS | Status: AC
  Filled 2015-05-13: qty 10

## 2015-05-13 NOTE — Progress Notes (Signed)
Echocardiogram 2D Echocardiogram with Definity has been performed.  Tresa Res 05/13/2015, 2:09 PM

## 2015-05-13 NOTE — Progress Notes (Signed)
Pt arrived to the unit via wheelchair. Placed on tele no complaints at this time. In the bed comfortably.

## 2015-05-13 NOTE — Progress Notes (Signed)
Transferred to [place] by wheelchair, stable, report given to RN, belongings with pt.

## 2015-05-13 NOTE — Progress Notes (Addendum)
PROGRESS NOTE  [name] [name] [id number] DOB: 10/13/1951 DOA: 05/12/2015 PCP: Kevan Ny, MD  HPI/Recap of past 24 hours:  Feeling better, denies chest pain , no sob, no edema.  Assessment/Plan: Principal Problem:   Acute on chronic combined systolic and diastolic congestive heart failure Active Problems:   Glaucoma   Hypertension   Tobacco abuse   Coronary artery disease   S/P CABG (coronary artery bypass graft)   Type 2 diabetes mellitus   CKD (chronic kidney disease)  1. Acute on chronic combined systolic and diastolic congestive heart failure -presented with sudden worsening of shortness of breath, wheezing, lower extremity edema, elevated blood pressure, sinus tachycardia in the 150's, hypoxia in the 70's, cxr consistent with pulmonary edema. -Symptoms are significantly improved after placing her on BiPAP and giving her Lasix. - currently off oxygen, Echocardiogram pending. - Continue metoprolol, hydralazine/imdur, titrate for bp control, home meds lisinopril held due to worsening of cr, consider restart at discharge -appreciate cardiology input.  2. COPD. Presenting symptom more consistent with heart failure/flush pulmonary edema. We will continue with her inhalers and prn duo nebs. No wheezing today.  3. Uncontrolled diabetes mellitus.  Home meds metformin held. hemoglobin A1c pending started insulin here, will likely discharge home with insulin (not on insulin PTA), diabetes education.  4. Chronic kidney disease most likely secondary to diabetes. Avoid nephrotoxic medications. acei held inpatient. Likely will need to restart at discharge.  5. H/o CAD s/p MI s/p 2vessel CABG in 2013, h/o CVA (right internal capsule, 2008), stable,   6. Chronic hip pain. Continue tramadol and gabapentin.  7. H/o cocaine use, self reported used cocaine for 35years, quit 51months ago.  8. Cigarette smoking: reported 10 a day, smoking cessation education provided.  9. Morbid  obesity: bmi 42.  Advance goals of care discussion: Full code   Code Status: full  Family Communication: patient  Disposition Plan: transfer to floor with tele, likely d/c home tomorrow.   Consultants:  cardiology  Procedures:  echo  Antibiotics:  none   Objective: BP 119/54 mmHg  Pulse 61  Temp(Src) 98.3 F (36.8 C) (Oral)  Resp 16  Ht 5\' 5"  (1.651 m)  Wt 122.9 kg (270 lb 15.1 oz)  BMI 45.09 kg/m2  SpO2 99%  Intake/Output Summary (Last 24 hours) at 05/13/15 1621 Last data filed at 05/13/15 1600  Gross per 24 hour  Intake    150 ml  Output   1200 ml  Net  -1050 ml   Filed Weights   05/12/15 0730 05/13/15 0300  Weight: 122.9 kg (270 lb 15.1 oz) 122.9 kg (270 lb 15.1 oz)    Exam:   General:  NAD  Cardiovascular: RRR  Respiratory: CTABL  Abdomen: Soft/ND/NT, positive BS  Musculoskeletal: No Edema  Neuro: no focal deficit, AAOx3.  Data Reviewed: Basic Metabolic Panel:  Recent Labs Lab 05/12/15 0404 05/12/15 0600 05/13/15 0311  NA 136  --  137  K 4.6  --  4.3  CL 102  --  101  CO2 22  --  23  GLUCOSE 369*  --  220*  BUN 22*  --  32*  CREATININE 1.62*  --  1.70*  CALCIUM 8.5*  --  8.9  MG  --  2.1 2.0   Liver Function Tests:  Recent Labs Lab 05/12/15 0404  AST 31  ALT 15  ALKPHOS 113  BILITOT 1.1  PROT 6.7  ALBUMIN 3.2*   No results for input(s): LIPASE, AMYLASE in the last  168 hours. No results for input(s): AMMONIA in the last 168 hours. CBC:  Recent Labs Lab 05/12/15 0404  WBC 7.3  NEUTROABS 2.6  HGB 12.6  HCT 41.3  MCV 77.6*  PLT 407*   Cardiac Enzymes:    Recent Labs Lab 05/12/15 0404 05/12/15 0600 05/12/15 1137 05/12/15 1900  TROPONINI 0.03 0.04* 0.07* 0.06*   BNP (last 3 results)  Recent Labs  05/12/15 0404  BNP 439.6*    ProBNP (last 3 results)  Recent Labs  10/18/14 1637  PROBNP 2288.0*    CBG:  Recent Labs Lab 05/12/15 1151 05/12/15 1700 05/12/15 2207 05/13/15 0849  05/13/15 1156  GLUCAP 388* 364* 245* 252* 190*    Recent Results (from the past 240 hour(s))  MRSA PCR Screening     Status: None   Collection Time: 05/12/15  7:12 AM  Result Value Ref Range Status   MRSA by PCR NEGATIVE NEGATIVE Final    Comment:        The GeneXpert MRSA Assay (FDA approved for NASAL specimens only), is one component of a comprehensive MRSA colonization surveillance program. It is not intended to diagnose MRSA infection nor to guide or monitor treatment for MRSA infections.      Studies: Dg Chest Portable 1 View  05/12/2015   CLINICAL DATA:  Respiratory distress.  EXAM: PORTABLE CHEST - 1 VIEW  COMPARISON:  10/18/2014  FINDINGS: Postoperative changes in the mediastinum. Mild cardiac enlargement with increased pulmonary vascularity. Perihilar interstitial infiltrates consistent with edema. This is progressing since previous study. Also there is interval development of focal consolidation in the right lung base which may indicate superimposed pneumonia.  IMPRESSION: Progressive congestive changes since previous study with increasing pulmonary edema. Superimposed consolidation in the right lung base may indicate pneumonia.   Electronically Signed   By: Lucienne Capers M.D.   On: 05/12/2015 04:41    Scheduled Meds: . antiseptic oral rinse  7 mL Mouth Rinse BID  . aspirin  325 mg Oral Daily  . brinzolamide  1 drop Both Eyes TID  . furosemide  40 mg Oral Daily  . gabapentin  300 mg Oral QHS  . heparin  5,000 Units Subcutaneous 3 times per day  . hydrALAZINE  50 mg Oral 3 times per day  . insulin aspart  0-15 Units Subcutaneous TID WC  . insulin glargine  30 Units Subcutaneous QHS  . isosorbide mononitrate  30 mg Oral Daily  . latanoprost  1 drop Both Eyes QHS  . loratadine  10 mg Oral Daily  . metoprolol tartrate  25 mg Oral BID  . mometasone-formoterol  2 puff Inhalation BID  . polyethylene glycol  17 g Oral Daily  . pravastatin  40 mg Oral QHS  .  senna-docusate  1 tablet Oral BID  . sodium chloride  3 mL Intravenous Q12H  . traMADol  50 mg Oral TID    Continuous Infusions:    Time spent: 40mins  Ramandeep Arington MD, PhD  [facility] Pager [phone]. If 7PM-7AM, please contact night-coverage at [email or web address], password [facility] 05/13/2015, 4:21 PM  LOS: 1 day

## 2015-05-13 NOTE — Progress Notes (Signed)
SUBJECTIVE:  She is breathing almost back to baseline.  No pain   PHYSICAL EXAM Filed Vitals:   05/13/15 0200 05/13/15 0300 05/13/15 0400 05/13/15 0646  BP: 151/71  141/79 154/80  Pulse: 69  69   Temp:      TempSrc:      Resp: 14  18   Height:      Weight:  270 lb 15.1 oz (122.9 kg)    SpO2: 97%  95%    General:  No distress Lungs:  Clear Heart:  RRR Abdomen:  Positive bowel sounds, no rebound no guarding Extremities:  No edema  LABS: Lab Results  Component Value Date   TROPONINI 0.06* 05/12/2015   Results for orders placed or performed during the hospital encounter of 05/12/15 (from the past 24 hour(s))  Urine rapid drug screen (hosp performed)     Status: None   Collection Time: 05/12/15  9:38 AM  Result Value Ref Range   Opiates NONE DETECTED NONE DETECTED   Cocaine NONE DETECTED NONE DETECTED   Benzodiazepines NONE DETECTED NONE DETECTED   Amphetamines NONE DETECTED NONE DETECTED   Tetrahydrocannabinol NONE DETECTED NONE DETECTED   Barbiturates NONE DETECTED NONE DETECTED  Urinalysis, Routine w reflex microscopic     Status: Abnormal   Collection Time: 05/12/15  9:38 AM  Result Value Ref Range   Color, Urine YELLOW YELLOW   APPearance CLEAR CLEAR   Specific Gravity, Urine 1.008 1.005 - 1.030   pH 5.0 5.0 - 8.0   Glucose, UA 250 (A) NEGATIVE mg/dL   Hgb urine dipstick NEGATIVE NEGATIVE   Bilirubin Urine NEGATIVE NEGATIVE   Ketones, ur NEGATIVE NEGATIVE mg/dL   Protein, ur NEGATIVE NEGATIVE mg/dL   Urobilinogen, UA 0.2 0.0 - 1.0 mg/dL   Nitrite NEGATIVE NEGATIVE   Leukocytes, UA MODERATE (A) NEGATIVE  Urine microscopic-add on     Status: Abnormal   Collection Time: 05/12/15  9:38 AM  Result Value Ref Range   Squamous Epithelial / LPF FEW (A) RARE   WBC, UA 11-20 <3 WBC/hpf   RBC / HPF 0-2 <3 RBC/hpf   Bacteria, UA FEW (A) RARE  Troponin I     Status: Abnormal   Collection Time: 05/12/15 11:37 AM  Result Value Ref Range   Troponin I 0.07 (H) <0.031  ng/mL  Glucose, capillary     Status: Abnormal   Collection Time: 05/12/15 11:51 AM  Result Value Ref Range   Glucose-Capillary 388 (H) 65 - 99 mg/dL   Comment 1 Capillary Specimen   Glucose, capillary     Status: Abnormal   Collection Time: 05/12/15  5:00 PM  Result Value Ref Range   Glucose-Capillary 364 (H) 65 - 99 mg/dL   Comment 1 Capillary Specimen   Troponin I     Status: Abnormal   Collection Time: 05/12/15  7:00 PM  Result Value Ref Range   Troponin I 0.06 (H) <0.031 ng/mL  Glucose, capillary     Status: Abnormal   Collection Time: 05/12/15 10:07 PM  Result Value Ref Range   Glucose-Capillary 245 (H) 65 - 99 mg/dL   Comment 1 Capillary Specimen   Magnesium     Status: None   Collection Time: 05/13/15  3:11 AM  Result Value Ref Range   Magnesium 2.0 1.7 - 2.4 mg/dL  Basic metabolic panel     Status: Abnormal   Collection Time: 05/13/15  3:11 AM  Result Value Ref Range   Sodium 137 135 -  145 mmol/L   Potassium 4.3 3.5 - 5.1 mmol/L   Chloride 101 101 - 111 mmol/L   CO2 23 22 - 32 mmol/L   Glucose, Bld 220 (H) 65 - 99 mg/dL   BUN 32 (H) 6 - 20 mg/dL   Creatinine, Ser 1.70 (H) 0.44 - 1.00 mg/dL   Calcium 8.9 8.9 - 10.3 mg/dL   GFR calc non Af Amer 31 (L) >60 mL/min   GFR calc Af Amer 36 (L) >60 mL/min   Anion gap 13 5 - 15    Intake/Output Summary (Last 24 hours) at 05/13/15 0846 Last data filed at 05/12/15 2100  Gross per 24 hour  Intake      0 ml  Output   1650 ml  Net  -1650 ml    ASSESSMENT AND PLAN:  ACUTE ON CHRONIC SYSTOLIC HF:  Echo has been ordered.    No results yet.  She is on previous PO diuretic dose.  If EF is unchanged no further work up will be planned.   CKD:  Creat is elevated but stable.    CAD:  Troponin elevation is a nonspecific pattern.  No further ischemia work up is indicated.   HTN:  Off ACE inhibitors for now.  On nitrates and hydralazine.  I will increase the hydralazine as the BP is not well controlled.     Jeneen Rinks  [facility] 05/13/2015 8:46 AM

## 2015-05-14 ENCOUNTER — Inpatient Hospital Stay (HOSPITAL_COMMUNITY): Payer: Medicaid Other

## 2015-05-14 DIAGNOSIS — I248 Other forms of acute ischemic heart disease: Secondary | ICD-10-CM

## 2015-05-14 LAB — GLUCOSE, CAPILLARY
GLUCOSE-CAPILLARY: 147 mg/dL — AB (ref 65–99)
GLUCOSE-CAPILLARY: 127 mg/dL — AB (ref 65–99)
GLUCOSE-CAPILLARY: 150 mg/dL — AB (ref 65–99)
Glucose-Capillary: 178 mg/dL — ABNORMAL HIGH (ref 65–99)

## 2015-05-14 LAB — BRAIN NATRIURETIC PEPTIDE: B Natriuretic Peptide: 471.1 pg/mL — ABNORMAL HIGH (ref 0.0–100.0)

## 2015-05-14 LAB — BASIC METABOLIC PANEL
Anion gap: 9 (ref 5–15)
BUN: 40 mg/dL — ABNORMAL HIGH (ref 6–20)
CO2: 28 mmol/L (ref 22–32)
CREATININE: 1.79 mg/dL — AB (ref 0.44–1.00)
Calcium: 8.9 mg/dL (ref 8.9–10.3)
Chloride: 104 mmol/L (ref 101–111)
GFR calc non Af Amer: 29 mL/min — ABNORMAL LOW (ref >60)
GFR, EST AFRICAN AMERICAN: 33 mL/min — AB (ref >60)
Glucose, Bld: 192 mg/dL — ABNORMAL HIGH (ref 65–99)
Potassium: 4.5 mmol/L (ref 3.5–5.1)
Sodium: 141 mmol/L (ref 135–145)

## 2015-05-14 LAB — MAGNESIUM: MAGNESIUM: 2.5 mg/dL — AB (ref 1.7–2.4)

## 2015-05-14 LAB — HEMOGLOBIN A1C
Hgb A1c MFr Bld: 7.2 % — ABNORMAL HIGH (ref 4.8–5.6)
Mean Plasma Glucose: 160 mg/dL

## 2015-05-14 MED ORDER — INSULIN STARTER KIT- PEN NEEDLES (ENGLISH)
1.0000 | Freq: Once | Status: AC
  Administered 2015-05-14: 1
  Filled 2015-05-14: qty 1

## 2015-05-14 MED ORDER — TECHNETIUM TC 99M SESTAMIBI GENERIC - CARDIOLITE
30.0000 | Freq: Once | INTRAVENOUS | Status: AC | PRN
  Administered 2015-05-14: 30 via INTRAVENOUS

## 2015-05-14 MED ORDER — LIVING WELL WITH DIABETES BOOK
Freq: Once | Status: AC
  Administered 2015-05-14: 15:00:00
  Filled 2015-05-14: qty 1

## 2015-05-14 MED ORDER — POLYETHYLENE GLYCOL 3350 17 G PO PACK: 17.0000 g | PACK | Freq: Every day | ORAL | Status: DC

## 2015-05-14 MED ORDER — SENNOSIDES-DOCUSATE SODIUM 8.6-50 MG PO TABS
2.0000 | ORAL_TABLET | Freq: Two times a day (BID) | ORAL | Status: DC
  Administered 2015-05-14 – 2015-05-17 (×6): 2 via ORAL
  Filled 2015-05-14 (×7): qty 2

## 2015-05-14 NOTE — Progress Notes (Signed)
PROGRESS NOTE  [name] [name] [id number] DOB: [birth year] DOA: 05/12/2015 PCP: Kevan Ny, MD  HPI/Recap of past 24 hours:  Feeling better, denies chest pain , no sob, no edema. Getting tow day myoview   Assessment/Plan: Principal Problem:   Acute on chronic combined systolic and diastolic congestive heart failure Active Problems:   Glaucoma   Hyperlipidemia   Essential hypertension   Tobacco abuse   Coronary artery disease   S/P CABG (coronary artery bypass graft)   Type 2 diabetes mellitus   CKD (chronic kidney disease)   Acute pulmonary edema   Morbid obesity   Demand ischemia of myocardium - related to Acue HF exacerbation  1. Acute on chronic combined systolic and diastolic congestive heart failure -presented with sudden worsening of shortness of breath, wheezing, lower extremity edema, elevated blood pressure, sinus tachycardia in the 150's, hypoxia in the 70's, cxr consistent with pulmonary edema. -improved after placing her on BiPAP and giving her Lasix. currently off oxygen, Echocardiogram with reduced EF. 2day stress test pending. - continue metoprolol, hydralazine/imdur, titrate for bp control, home meds lisinopril held due to worsening of cr, consider restart at discharge if renal function allows -appreciate cardiology input.  2. COPD. Presenting symptom more consistent with heart failure/flush pulmonary edema. We will continue with her inhalers and prn duo nebs. No wheezing today.  3. Uncontrolled diabetes mellitus.  Home meds metformin held, not a candidate to resume metformin at discharge due to renal impairment. hemoglobin A1c 7.2 started insulin here, will likely discharge home with insulin (not on insulin PTA), diabetes education.  4. Chronic kidney disease most likely secondary to diabetes. Avoid nephrotoxic medications. acei held inpatient. Cr worsening, may not be able to restart acei at discharge.  5. H/o CAD s/p MI s/p 2vessel CABG in 2013, h/o  CVA (right internal capsule, 2008), denies chest pain. Undergoing 2day Stress test due to reduced EF.  6. Chronic hip pain. Continue tramadol and gabapentin.  7. H/o cocaine use, self reported used cocaine for 35years, quit 41months ago.  8. Cigarette smoking: reported 10 a day, smoking cessation education provided.  9. Morbid obesity: bmi 42.  Advance goals of care discussion: Full code   Code Status: full  Family Communication: patient  Disposition Plan: remain inpatient, discharge decision pending on stress test result. Will need home health/RN for chf and diabetes management.  Consultants:  cardiology  Procedures:  Echo  2day stress test  Antibiotics:  none   Objective: BP 123/42 mmHg  Pulse 67  Temp(Src) 97.7 F (36.5 C) (Oral)  Resp 20  Ht 5\' 5"  (1.651 m)  Wt 116.302 kg (256 lb 6.4 oz)  BMI 42.67 kg/m2  SpO2 100%  Intake/Output Summary (Last 24 hours) at 05/14/15 1610 Last data filed at 05/14/15 1513  Gross per 24 hour  Intake   1340 ml  Output   1625 ml  Net   -285 ml   Filed Weights   05/12/15 0730 05/13/15 0300 05/14/15 0624  Weight: 122.9 kg (270 lb 15.1 oz) 122.9 kg (270 lb 15.1 oz) 116.302 kg (256 lb 6.4 oz)    Exam:   General:  NAD  Cardiovascular: RRR  Respiratory: CTABL  Abdomen: Soft/ND/NT, positive BS  Musculoskeletal: No Edema  Neuro: no focal deficit, AAOx3.  Data Reviewed: Basic Metabolic Panel:  Recent Labs Lab 05/12/15 0404 05/12/15 0600 05/13/15 0311 05/14/15 0312  NA 136  --  137 141  K 4.6  --  4.3 4.5  CL 102  --  101 104  CO2 22  --  23 28  GLUCOSE 369*  --  220* 192*  BUN 22*  --  32* 40*  CREATININE 1.62*  --  1.70* 1.79*  CALCIUM 8.5*  --  8.9 8.9  MG  --  2.1 2.0 2.5*   Liver Function Tests:  Recent Labs Lab 05/12/15 0404  AST 31  ALT 15  ALKPHOS 113  BILITOT 1.1  PROT 6.7  ALBUMIN 3.2*   No results for input(s): LIPASE, AMYLASE in the last 168 hours. No results for input(s):  AMMONIA in the last 168 hours. CBC:  Recent Labs Lab 05/12/15 0404  WBC 7.3  NEUTROABS 2.6  HGB 12.6  HCT 41.3  MCV 77.6*  PLT 407*   Cardiac Enzymes:    Recent Labs Lab 05/12/15 0404 05/12/15 0600 05/12/15 1137 05/12/15 1900  TROPONINI 0.03 0.04* 0.07* 0.06*   BNP (last 3 results)  Recent Labs  05/12/15 0404 05/14/15 0312  BNP 439.6* 471.1*    ProBNP (last 3 results)  Recent Labs  10/18/14 1637  PROBNP 2288.0*    CBG:  Recent Labs Lab 05/13/15 1156 05/13/15 1658 05/13/15 2137 05/14/15 0550 05/14/15 1132  GLUCAP 190* 175* 172* 147* 178*    Recent Results (from the past 240 hour(s))  MRSA PCR Screening     Status: None   Collection Time: 05/12/15  7:12 AM  Result Value Ref Range Status   MRSA by PCR NEGATIVE NEGATIVE Final    Comment:        The GeneXpert MRSA Assay (FDA approved for NASAL specimens only), is one component of a comprehensive MRSA colonization surveillance program. It is not intended to diagnose MRSA infection nor to guide or monitor treatment for MRSA infections.      Studies: No results found.  Scheduled Meds: . antiseptic oral rinse  7 mL Mouth Rinse BID  . aspirin  325 mg Oral Daily  . brinzolamide  1 drop Both Eyes TID  . furosemide  40 mg Oral Daily  . gabapentin  300 mg Oral QHS  . heparin  5,000 Units Subcutaneous 3 times per day  . hydrALAZINE  50 mg Oral 3 times per day  . insulin aspart  0-15 Units Subcutaneous TID WC  . insulin glargine  30 Units Subcutaneous QHS  . isosorbide mononitrate  30 mg Oral Daily  . latanoprost  1 drop Both Eyes QHS  . loratadine  10 mg Oral Daily  . metoprolol tartrate  25 mg Oral BID  . mometasone-formoterol  2 puff Inhalation BID  . polyethylene glycol  17 g Oral Daily  . pravastatin  40 mg Oral QHS  . senna-docusate  2 tablet Oral BID  . sodium chloride  3 mL Intravenous Q12H  . traMADol  50 mg Oral TID    Continuous Infusions:    Time spent: 63mins  Jericca Russett  MD, PhD  [facility] Pager [phone]. If 7PM-7AM, please contact night-coverage at [email or web address], password [facility] 05/14/2015, 4:10 PM  LOS: 2 days

## 2015-05-14 NOTE — Evaluation (Signed)
Physical Therapy Evaluation/ Discharge Patient Details Name: [name] [name] MRN: [id number] DOB: [birth year] Today's Date: 05/14/2015   History of Present Illness  [name] [name] is a 64 y.o. female with Past medical history of hypertension, coronary artery disease status post CABG, history of CVA, dyslipidemia, diabetes mellitus, chronic kidney disease likely secondary to diabetes, history of substance abuse admitted with SOB  Clinical Impression  Pt pleasant and moving well. Pt reports no difficulty with mobility at home, no falls and has 24hr assist from cousin as needed. Pt with sats maintained at 98-99% on RA throughout activity, no LOB, good strength bil LE. Encouraged walking program and HEP for home with pt able to verbalize understanding. Pt without further therapy needs and recommend daily ambulation with nursing supervision.     Follow Up Recommendations No PT follow up    Equipment Recommendations  None recommended by PT    Recommendations for Other Services       Precautions / Restrictions Precautions Precautions: Fall      Mobility  Bed Mobility Overal bed mobility: Modified Independent                Transfers Overall transfer level: Modified independent                  Ambulation/Gait Ambulation/Gait assistance: Modified independent (Device/Increase time) Ambulation Distance (Feet): 350 Feet Assistive device: Rolling walker (2 wheeled) Gait Pattern/deviations: Step-through pattern;Decreased stride length   Gait velocity interpretation: at or above normal speed for age/gender General Gait Details: steady gait with RW due to left hip pain  Stairs            Wheelchair Mobility    Modified Rankin (Stroke Patients Only)       Balance Overall balance assessment: No apparent balance deficits (not formally assessed)                                           Pertinent Vitals/Pain Pain Assessment: 0-10 Pain Score: 4   Pain Location: left hip Pain Descriptors / Indicators: Aching Pain Intervention(s): Limited activity within patient's tolerance    Home Living Family/patient expects to be discharged to:: Private residence Living Arrangements: Other relatives Available Help at Discharge: Family;Available 24 hours/day Type of Home: Apartment Home Access: Level entry     Home Layout: One level Home Equipment: Walker - 4 wheels;Cane - quad;Shower seat      Prior Function Level of Independence: Independent with assistive device(s)         Comments: cane at times when left hip OA bothers her, rollator in community, shower seat for bathing. Doesnt drive     Hand Dominance        Extremity/Trunk Assessment   Upper Extremity Assessment: Overall WFL for tasks assessed           Lower Extremity Assessment: Overall WFL for tasks assessed      Cervical / Trunk Assessment: Normal  Communication   Communication: No difficulties  Cognition Arousal/Alertness: Awake/alert Behavior During Therapy: WFL for tasks assessed/performed Overall Cognitive Status: Within Functional Limits for tasks assessed                      General Comments General comments (skin integrity, edema, etc.): pt reports no falls or difficulty with gait    Exercises General Exercises - Lower Extremity Long Arc Quad: AROM;Seated;Both;20  reps Hip Flexion/Marching: AROM;Seated;Both;20 reps      Assessment/Plan    PT Assessment Patent does not need any further PT services  PT Diagnosis Acute pain   PT Problem List    PT Treatment Interventions     PT Goals (Current goals can be found in the Care Plan section) Acute Rehab PT Goals PT Goal Formulation: All assessment and education complete, DC therapy    Frequency     Barriers to discharge        Co-evaluation               End of Session   Activity Tolerance: Patient tolerated treatment well Patient left: in chair;with call bell/phone  within reach Nurse Communication: Mobility status         Time: [id number] PT Time Calculation (min) (ACUTE ONLY): 17 min   Charges:   PT Evaluation $Initial PT Evaluation Tier I: 1 Procedure     PT G CodesMelford Aase 05/14/2015, 2:02 PM Elwyn Reach, [place]

## 2015-05-14 NOTE — Care Management Note (Signed)
Case Management Note  Patient Details  Name: [name] [name] MRN: [id number] Date of Birth: 14-Dec-1951  Subjective/Objective:        Pt admitted on 05/12/15 with CHF exacerbation.  PTA, pt resides at home with cousin.              Action/Plan: PT recommending no OP follow up at discharge.  Will follow for dc needs as pt progresses.  May benefit from [facility] for CHF management at dc.    Expected Discharge Date:                  Expected Discharge Plan:  [place]  In-House Referral:     Discharge planning Services  CM Consult  Post Acute Care Choice:    Choice offered to:     DME Arranged:    DME Agency:     HH Arranged:    [place] Agency:     Status of Service:  In process, will continue to follow  Medicare Important Message Given:    Date Medicare IM Given:    Medicare IM give by:    Date Additional Medicare IM Given:    Additional Medicare Important Message give by:     If discussed at [place] of Stay Meetings, dates discussed:    Additional Comments:  Ella Bodo, RN 05/14/2015, 3:24 PM Phone [phone]

## 2015-05-14 NOTE — Progress Notes (Signed)
Pt off unit unable to get vitals

## 2015-05-14 NOTE — Progress Notes (Signed)
Subjective: Breathing better  Objective: Vital signs in last 24 hours: Temp:  [97.8 F (36.6 C)-98.3 F (36.8 C)] 98.3 F (36.8 C) (05/16 0624) Pulse Rate:  [68-83] 70 (05/16 1030) Resp:  [14-18] 18 (05/16 0231) BP: (129-167)/(66-96) 136/73 mmHg (05/16 1030) SpO2:  [100 %] 100 % (05/16 0624) Weight:  [116.302 kg (256 lb 6.4 oz)] 116.302 kg (256 lb 6.4 oz) (05/16 0624) Last BM Date: 05/06/15  Intake/Output from previous day: 05/15 0701 - 05/16 0700 In: 740 [P.O.:740] Out: 1675 [Urine:1675] Intake/Output this shift: Total I/O In: 535 [P.O.:535] Out: -   Medications Scheduled Meds: . antiseptic oral rinse  7 mL Mouth Rinse BID  . aspirin  325 mg Oral Daily  . brinzolamide  1 drop Both Eyes TID  . furosemide  40 mg Oral Daily  . gabapentin  300 mg Oral QHS  . heparin  5,000 Units Subcutaneous 3 times per day  . hydrALAZINE  50 mg Oral 3 times per day  . insulin aspart  0-15 Units Subcutaneous TID WC  . insulin glargine  30 Units Subcutaneous QHS  . insulin starter kit- pen needles  1 kit Other Once  . isosorbide mononitrate  30 mg Oral Daily  . latanoprost  1 drop Both Eyes QHS  . living well with diabetes book   Does not apply Once  . loratadine  10 mg Oral Daily  . metoprolol tartrate  25 mg Oral BID  . mometasone-formoterol  2 puff Inhalation BID  . polyethylene glycol  17 g Oral Daily  . pravastatin  40 mg Oral QHS  . senna-docusate  1 tablet Oral BID  . sodium chloride  3 mL Intravenous Q12H  . traMADol  50 mg Oral TID   Continuous Infusions:  PRN Meds:.sodium chloride, acetaminophen, ipratropium-albuterol, magnesium hydroxide, sodium chloride  PE: General appearance: alert, cooperative and no distress Lungs: clear to auscultation bilaterally Heart: regular rate and rhythm, S1, S2 normal, no murmur, click, rub or gallop Extremities: No LEE Pulses: 2+ and symmetric Skin: Warm and dry Neurologic: Grossly normal  Lab Results:   Recent Labs   05/12/15 0404  WBC 7.3  HGB 12.6  HCT 41.3  PLT 407*   BMET  Recent Labs  05/12/15 0404 05/13/15 0311 05/14/15 0312  NA 136 137 141  K 4.6 4.3 4.5  CL 102 101 104  CO2 _0 GLUCOSE 369* 220* 192*  BUN 22* 32* 40*  CREATININE 1.62* 1.70* 1.79*  CALCIUM 8.5* 8.9 8.9   PT/INR  Recent Labs  05/12/15 0600  LABPROT 13.1  INR 0.98    Assessment/Plan  Principal Problem:   Acute on chronic combined systolic and diastolic congestive heart failure Active Problems:   Demand ischemia of myocardium - related to Acue HF exacerbation   Hyperlipidemia   Essential hypertension   Coronary artery disease   S/P CABG (coronary artery bypass graft)   Acute pulmonary edema   Morbid obesity   Glaucoma   Tobacco abuse   Type 2 diabetes mellitus   CKD (chronic kidney disease)  ACUTE ON CHRONIC SYSTOLIC HF:  Net fluids:  -0.9L/-3.0L.  Echo: EF 40-45%, mild LVH, hypokinesis of themid-apicalanteroseptal and apical myocardium, high ventricular filling pressure.  She is back on previous PO diuretic dose of Lasix 31m daily.  Previous EF was 55% in July 2013(distal inferior/inferoseptal and apical hypokinesis).  SCr increased slightly and BUN up.    CKD: Creat is up slightly.  Continue to monitor.  CAD:  Troponin elevation is a nonspecific pattern. EF decreased.  Will arrange stress test for tomorrow(needs two day).  Continues to smoke.  Cocaine up until 5 months ago.  She is also planning plastic surgery on her CABG scar later this year.     HTN: Off ACE inhibitors for now. On nitrates and hydralazine(increased yesterday). BP better.   Obesity, morbid   LOS: 2 days    HAGER, BRYAN PA-C 05/14/2015 10:49 AM  I have seen, examined and evaluated the patient this AM along with Mr. Samara Snide, PA-C on rounds.  We have discussed the patient & recommendations.  After reviewing all the available data and chart,  I agree with his findings, examination as well as impression  recommendations.  Admitted with A on C HF & newly reduced EF with WMA on Echo -- mild troponin + is likely related to demand ischemia. In setting of newly reduced EF & RWMA on Echo - I agree that ishemic evaluation is warranted - she is s/p CABG & is pending upcoming plastic surgery  Using Hydralazine/Nitrates vs. ACE-I with worsening renal function.  On PO Lasix now.  Will need 2 day Myoview.  Needs nutrition counseling for obesity.  Leonie Man, M.D., M.S. Interventional Cardiologist   Pager # [phone]

## 2015-05-14 NOTE — Progress Notes (Addendum)
Noted consult for uncontrolled diabetes and initiating insulin as an outpatient. Spoke with patient over the phone about diabetes and addition of insulin for diabetes control. Patient reports that she has not been told by the doctor that she would be using insulin as an outpatient. Explained to patient that MD has noted in chart that she will likely be started on insulin as an outpatient to improve glycemic control. Patient reports that she is followed by her PCP for diabetes management and currently she takes  Armaryl 4 mg QAM and Metformin 1000 mg BID as an outpatient for diabetes control.  Informed patient that an A1C has been ordered but has not resulted at this time. Reviewed what an A1C is and discussed importance of maintaining good CBG control to prevent long-term and short-term complications. Discussed impact of nutrition, exercise, stress, sickness, and medications on diabetes control. Patient states that she is willing to use insulin as an outpatient if needed. Explained to patient that a diabetes coordinator on the [place] would come by to talk with her about insulin injections.  Patient verbalized understanding of information discussed and she states that she has no further questions at this time related to diabetes. Ordered Living Well with Diabetes booklet, insulin pen starter kit, patient education videos on diabetes and insulin teaching, and nursing order for diabetes education and insulin teaching. Talked with Gabriel Cirri, RN and asked that nursing use each interaction with patient to teach about diabetes control and insulin teaching. Asked that patient be allowed to take an active role in her care by being able to check her own glucose and give herself insulin injections.  Tobey Grim, RN, MSN, Inpatient Diabetes Coordinator discussed insulin injections with vial/syringe and insulin pens with the patient and also educated patient on insulin injections with both methods (vial/syringe &  insulin pen). Tobey Grim, RN reviewed all steps of insulin pen including attachment of needle, 2-unit air shot, dialing up dose, giving injection, removing needle, disposal of sharps, storage of unused insulin, disposal of insulin etc. Patient was able to provide successful return demonstration to S. Jannifer Franklin, Therapist, sports.    At time of discharge, MD to give patient Rxs for insulin pens and insulin pen needles.     Thanks, Barnie Alderman, RN, MSN, CCRN, CDE Diabetes Coordinator Inpatient Diabetes Program [phone] (Team Pager) [phone] ([facility] office) [phone] [facility] office)

## 2015-05-15 ENCOUNTER — Encounter (HOSPITAL_COMMUNITY): Payer: Medicaid Other

## 2015-05-15 LAB — GLUCOSE, CAPILLARY
Glucose-Capillary: 119 mg/dL — ABNORMAL HIGH (ref 65–99)
Glucose-Capillary: 110 mg/dL — ABNORMAL HIGH (ref 65–99)
Glucose-Capillary: 196 mg/dL — ABNORMAL HIGH (ref 65–99)
Glucose-Capillary: 160 mg/dL — ABNORMAL HIGH (ref 65–99)

## 2015-05-15 LAB — BASIC METABOLIC PANEL
Anion gap: 8 (ref 5–15)
BUN: 38 mg/dL — AB (ref 6–20)
CHLORIDE: 103 mmol/L (ref 101–111)
CO2: 29 mmol/L (ref 22–32)
CREATININE: 1.63 mg/dL — AB (ref 0.44–1.00)
Calcium: 9.1 mg/dL (ref 8.9–10.3)
GFR calc Af Amer: 37 mL/min — ABNORMAL LOW (ref >60)
GFR, EST NON AFRICAN AMERICAN: 32 mL/min — AB (ref >60)
GLUCOSE: 184 mg/dL — AB (ref 65–99)
POTASSIUM: 4.6 mmol/L (ref 3.5–5.1)
Sodium: 140 mmol/L (ref 135–145)

## 2015-05-15 MED ORDER — ISOSORBIDE MONONITRATE ER 60 MG PO TB24: 60.0000 mg | ORAL_TABLET | Freq: Every day | ORAL | Status: DC

## 2015-05-15 MED ORDER — ISOSORBIDE MONONITRATE ER 30 MG PO TB24: 30.0000 mg | ORAL_TABLET | Freq: Every day | ORAL | Status: DC

## 2015-05-15 MED ORDER — REGADENOSON 0.4 MG/5ML IV SOLN
0.4000 mg | Freq: Once | INTRAVENOUS | Status: AC
  Administered 2015-05-16: 0.4 mg via INTRAVENOUS
  Filled 2015-05-15: qty 5

## 2015-05-15 MED ORDER — FLEET ENEMA 7-19 GM/118ML RE ENEM
1.0000 | ENEMA | Freq: Once | RECTAL | Status: AC
  Administered 2015-05-15: 1 via RECTAL
  Filled 2015-05-15: qty 1

## 2015-05-15 MED ORDER — HYDRALAZINE HCL 50 MG PO TABS: 50.0000 mg | ORAL_TABLET | Freq: Three times a day (TID) | ORAL | Status: DC

## 2015-05-15 MED ORDER — INSULIN GLARGINE 100 UNIT/ML SOLOSTAR PEN: 30.0000 [IU] | PEN_INJECTOR | Freq: Every day | SUBCUTANEOUS | Status: DC

## 2015-05-15 MED ORDER — MINERAL OIL RE ENEM
1.0000 | ENEMA | Freq: Once | RECTAL | Status: AC
  Administered 2015-05-15: 1 via RECTAL
  Filled 2015-05-15: qty 1

## 2015-05-15 NOTE — Progress Notes (Signed)
Inpatient Diabetes Program Recommendations  AACE/ADA: New Consensus Statement on Inpatient Glycemic Control (2013)  Target Ranges:  Prepandial:   less than 140 mg/dL      Peak postprandial:   less than 180 mg/dL (1-2 hours)      Critically ill patients:  140 - 180 mg/dL   Inpatient Diabetes Program Recommendations Insulin - Basal: Noted patient started on 30 units insulin lantus at HS-has been very effective in controlling cbg's. However, there may be concerns re potential for hypoglycemia with kidney insufficiency. May want to decrease home dose slightly to 20-25 units to start.at home.   Thank you Rosita Kea, RN, MSN, CDE  Diabetes Inpatient Program Office: [phone] Pager: [phone] 8:00 am to 5:00 pm

## 2015-05-15 NOTE — Progress Notes (Signed)
PROGRESS NOTE  [name] [name] [id number] DOB: 1951/10/01 DOA: 05/12/2015 PCP: Kevan Ny, MD  HPI/Recap of past 24 hours:  Stress test moved to tomorrow.  Feeling better, denies chest pain , no sob, no edema. C/o severe constipation, no BM from enema this am, no bm since 5/8.    Assessment/Plan: Principal Problem:   Acute on chronic combined systolic and diastolic congestive heart failure Active Problems:   Glaucoma   Hyperlipidemia   Essential hypertension   Tobacco abuse   Coronary artery disease   S/P CABG (coronary artery bypass graft)   Type 2 diabetes mellitus   CKD (chronic kidney disease)   Acute pulmonary edema   Morbid obesity   Demand ischemia of myocardium - related to Acue HF exacerbation   Acute respiratory failure with hypoxia   Type 2 diabetes mellitus with hyperosmolarity without coma  1. Acute on chronic combined systolic and diastolic congestive heart failure -presented with sudden worsening of shortness of breath, wheezing, lower extremity edema, elevated blood pressure, sinus tachycardia in the 150's, hypoxia in the 70's, cxr consistent with pulmonary edema. -improved after placing her on BiPAP and giving her Lasix. currently off oxygen, Echocardiogram with reduced EF. 2day stress test pending. - continue metoprolol, hydralazine/imdur, titrate for bp control, home meds lisinopril held due to worsening of cr, consider restart at discharge if renal function allows -appreciate cardiology input.  2. COPD. Presenting symptom more consistent with heart failure/flush pulmonary edema. We will continue with her inhalers and prn duo nebs. No wheezing today.  3. Uncontrolled diabetes mellitus.  Home meds metformin held, not a candidate to resume metformin at discharge due to renal impairment. hemoglobin A1c 7.2 started insulin here, will likely discharge home with insulin (not on insulin PTA), diabetes education.  4. Chronic kidney disease most likely  secondary to diabetes. Avoid nephrotoxic medications. acei held inpatient. Cr worsening, may not be able to restart acei at discharge.  5. H/o CAD s/p MI s/p 2vessel CABG in 2013, h/o CVA (right internal capsule, 2008), denies chest pain. Undergoing 2day Stress test due to reduced EF.  6. Chronic hip pain. Continue tramadol and gabapentin.  7. H/o cocaine use, self reported used cocaine for 35years, quit 74months ago.  8. Cigarette smoking: reported 10 a day, smoking cessation education provided.  9. Morbid obesity: bmi 42.  Advance goals of care discussion: Full code   Code Status: full  Family Communication: patient  Disposition Plan: remain inpatient, discharge decision pending on stress test result. Will need home health/RN for chf and diabetes management.  Consultants:  cardiology  Procedures:  Echo  2day stress test  Antibiotics:  none   Objective: BP 140/80 mmHg  Pulse 89  Temp(Src) 98.6 F (37 C) (Oral)  Resp 18  Ht 5\' 5"  (1.651 m)  Wt 114.941 kg (253 lb 6.4 oz)  BMI 42.17 kg/m2  SpO2 100%  Intake/Output Summary (Last 24 hours) at 05/15/15 2104 Last data filed at 05/15/15 2020  Gross per 24 hour  Intake    876 ml  Output   1925 ml  Net  -1049 ml   Filed Weights   05/13/15 0300 05/14/15 0624 05/15/15 0552  Weight: 122.9 kg (270 lb 15.1 oz) 116.302 kg (256 lb 6.4 oz) 114.941 kg (253 lb 6.4 oz)    Exam:   General:  NAD  Cardiovascular: RRR  Respiratory: CTABL  Abdomen: Soft/ND/NT, positive BS  Musculoskeletal: No Edema  Neuro: no focal deficit, AAOx3.  Data Reviewed: Basic Metabolic  Panel:  Recent Labs Lab 05/12/15 0404 05/12/15 0600 05/13/15 0311 05/14/15 0312 05/15/15 0231  NA 136  --  137 141 140  K 4.6  --  4.3 4.5 4.6  CL 102  --  101 104 103  CO2 22  --  23 28 29   GLUCOSE 369*  --  220* 192* 184*  BUN 22*  --  32* 40* 38*  CREATININE 1.62*  --  1.70* 1.79* 1.63*  CALCIUM 8.5*  --  8.9 8.9 9.1  MG  --  2.1 2.0  2.5*  --    Liver Function Tests:  Recent Labs Lab 05/12/15 0404  AST 31  ALT 15  ALKPHOS 113  BILITOT 1.1  PROT 6.7  ALBUMIN 3.2*   No results for input(s): LIPASE, AMYLASE in the last 168 hours. No results for input(s): AMMONIA in the last 168 hours. CBC:  Recent Labs Lab 05/12/15 0404  WBC 7.3  NEUTROABS 2.6  HGB 12.6  HCT 41.3  MCV 77.6*  PLT 407*   Cardiac Enzymes:    Recent Labs Lab 05/12/15 0404 05/12/15 0600 05/12/15 1137 05/12/15 1900  TROPONINI 0.03 0.04* 0.07* 0.06*   BNP (last 3 results)  Recent Labs  05/12/15 0404 05/14/15 0312  BNP 439.6* 471.1*    ProBNP (last 3 results)  Recent Labs  10/18/14 1637  PROBNP 2288.0*    CBG:  Recent Labs Lab 05/14/15 1720 05/14/15 2120 05/15/15 0551 05/15/15 1135 05/15/15 1605  GLUCAP 127* 150* 119* 110* 196*    Recent Results (from the past 240 hour(s))  MRSA PCR Screening     Status: None   Collection Time: 05/12/15  7:12 AM  Result Value Ref Range Status   MRSA by PCR NEGATIVE NEGATIVE Final    Comment:        The GeneXpert MRSA Assay (FDA approved for NASAL specimens only), is one component of a comprehensive MRSA colonization surveillance program. It is not intended to diagnose MRSA infection nor to guide or monitor treatment for MRSA infections.      Studies: No results found.  Scheduled Meds: . antiseptic oral rinse  7 mL Mouth Rinse BID  . aspirin  325 mg Oral Daily  . brinzolamide  1 drop Both Eyes TID  . furosemide  40 mg Oral Daily  . gabapentin  300 mg Oral QHS  . heparin  5,000 Units Subcutaneous 3 times per day  . hydrALAZINE  50 mg Oral 3 times per day  . insulin aspart  0-15 Units Subcutaneous TID WC  . insulin glargine  30 Units Subcutaneous QHS  . isosorbide mononitrate  30 mg Oral Daily  . latanoprost  1 drop Both Eyes QHS  . loratadine  10 mg Oral Daily  . metoprolol tartrate  25 mg Oral BID  . mineral oil  1 enema Rectal Once  .  mometasone-formoterol  2 puff Inhalation BID  . polyethylene glycol  17 g Oral Daily  . pravastatin  40 mg Oral QHS  . regadenoson  0.4 mg Intravenous Once  . senna-docusate  2 tablet Oral BID  . sodium chloride  3 mL Intravenous Q12H  . traMADol  50 mg Oral TID    Continuous Infusions:    Time spent: 61mins  Aniyla Harling MD, PhD  [facility] Pager [phone]. If 7PM-7AM, please contact night-coverage at [email or web address], password [facility] 05/15/2015, 9:04 PM  LOS: 3 days

## 2015-05-15 NOTE — Progress Notes (Addendum)
Pt given fleet enema today without BM, no abdominal pain, pt with active bowel sounds, eating well. MD notified and new order for mineral oil enema. Enema administered. Will continue to monitor. Ronnette Hila, RN

## 2015-05-15 NOTE — Progress Notes (Signed)
As ordered, pt was instructed and watched the following diabetes videos:  501-Basic skills for controlling diabetes; 0000000 Long Term Complications; [id number] Your Diabetes; 504-Introduction to Carb Counting; 505-Diabetes: Foot Care; 506-Diabetes: Insulin Injection; 507-Diabetes & Nutrition: Eating for Health; 508-Diabetes: Reducing Fears About Injections; 509-Diabetes and Heart Disease; 510-Monitoring Blood Glucose; 511-How to use an insulin pen.

## 2015-05-15 NOTE — Progress Notes (Signed)
Patient Name: [name] [name] Date of Encounter: 05/15/2015     Principal Problem:   Acute on chronic combined systolic and diastolic congestive heart failure Active Problems:   Glaucoma   Hyperlipidemia   Essential hypertension   Tobacco abuse   Coronary artery disease   S/P CABG (coronary artery bypass graft)   Type 2 diabetes mellitus   CKD (chronic kidney disease)   Acute pulmonary edema   Morbid obesity   Demand ischemia of myocardium - related to Acue HF exacerbation   Acute respiratory failure with hypoxia   Type 2 diabetes mellitus with hyperosmolarity without coma    SUBJECTIVE  Awaiting her stress test. No complaints.   CURRENT MEDS . antiseptic oral rinse  7 mL Mouth Rinse BID  . aspirin  325 mg Oral Daily  . brinzolamide  1 drop Both Eyes TID  . furosemide  40 mg Oral Daily  . gabapentin  300 mg Oral QHS  . heparin  5,000 Units Subcutaneous 3 times per day  . hydrALAZINE  50 mg Oral 3 times per day  . insulin aspart  0-15 Units Subcutaneous TID WC  . insulin glargine  30 Units Subcutaneous QHS  . isosorbide mononitrate  30 mg Oral Daily  . latanoprost  1 drop Both Eyes QHS  . loratadine  10 mg Oral Daily  . metoprolol tartrate  25 mg Oral BID  . mometasone-formoterol  2 puff Inhalation BID  . polyethylene glycol  17 g Oral Daily  . pravastatin  40 mg Oral QHS  . senna-docusate  2 tablet Oral BID  . sodium chloride  3 mL Intravenous Q12H  . sodium phosphate  1 enema Rectal Once  . traMADol  50 mg Oral TID    OBJECTIVE  Filed Vitals:   05/14/15 2123 05/15/15 0552 05/15/15 0845 05/15/15 1019  BP: 147/69 141/80  152/93  Pulse: 76 77  80  Temp: 98.2 F (36.8 C) 98 F (36.7 C)    TempSrc: Oral Oral    Resp: 18 18    Height:      Weight:  253 lb 6.4 oz (114.941 kg)    SpO2: 98% 99% 95%     Intake/Output Summary (Last 24 hours) at 05/15/15 1130 Last data filed at 05/15/15 1027  Gross per 24 hour  Intake    935 ml  Output   2675 ml  Net   -1740 ml   Filed Weights   05/13/15 0300 05/14/15 0624 05/15/15 0552  Weight: 270 lb 15.1 oz (122.9 kg) 256 lb 6.4 oz (116.302 kg) 253 lb 6.4 oz (114.941 kg)    PHYSICAL EXAM  General appearance: alert, cooperative and no distress Lungs: clear to auscultation bilaterally Heart: regular rate and rhythm, S1, S2 normal, no murmur, click, rub or gallop Extremities: No LEE Pulses: 2+ and symmetric Skin: Warm and dry Neurologic: Grossly normal  Accessory Clinical Findings  CBC No results for input(s): WBC, NEUTROABS, HGB, HCT, MCV, PLT in the last 72 hours. Basic Metabolic Panel  Recent Labs  05/13/15 0311 05/14/15 0312 05/15/15 0231  NA 137 141 140  K 4.3 4.5 4.6  CL 101 104 103  CO2 23 28 29   GLUCOSE 220* 192* 184*  BUN 32* 40* 38*  CREATININE 1.70* 1.79* 1.63*  CALCIUM 8.9 8.9 9.1  MG 2.0 2.5*  --     Cardiac Enzymes  Recent Labs  05/12/15 1137 05/12/15 1900  TROPONINI 0.07* 0.06*    TELE  NSR  Radiology/Studies  Dg Chest Portable 1 View  05/12/2015   CLINICAL DATA:  Respiratory distress.  EXAM: PORTABLE CHEST - 1 VIEW  COMPARISON:  10/18/2014  FINDINGS: Postoperative changes in the mediastinum. Mild cardiac enlargement with increased pulmonary vascularity. Perihilar interstitial infiltrates consistent with edema. This is progressing since previous study. Also there is interval development of focal consolidation in the right lung base which may indicate superimposed pneumonia.  IMPRESSION: Progressive congestive changes since previous study with increasing pulmonary edema. Superimposed consolidation in the right lung base may indicate pneumonia.   Electronically Signed   By: Lucienne Capers M.D.   On: 05/12/2015 04:41    ASSESSMENT AND PLAN  ACUTE ON CHRONIC SYSTOLIC HF:  -- Net neg 0000000. Weight down 17 lbs? (270--> 253 lbs). Now on PO lasix 40mg  qd ( this is her home dose).  -- Echo: EF 40-45%, mild LVH, hypokinesis of themid-apicalanteroseptal and  apical myocardium, high ventricular filling pressure. Previous EF was 55% in July 2013(distal inferior/inferoseptal and apical hypokinesis).Plan for two day stress test today.  -- Continue Lopressor 25mg  BID and hydralazine/nitrates.   CKD: Creat is improved today 1.79--> 1.63. Continue to monitor.   CAD: Troponin elevation is a nonspecific pattern. EF decreased with WMA. On for stress test for today (needs two day). Agree that ishemic evaluation is warranted - she is s/p CABG & is pending upcoming plastic surgery on CABG scar  Tobacco abuse/Cocaine abuse- Continues to smoke. Cocaine up until 5 months ago. Counseled on cessation.   HTN: moderate control. Off ACE inhibitors for now due to worsening kidney function. On nitrates and hydralazine now. Continue BB.   Obesity- Needs nutrition counseling for obesity.  SignedEileen Stanford PA-C  Pager [phone]    I have seen, examined and evaluated the patient this PM along with Mrs. Grandville Silos, PA-C.  After reviewing all the available data and chart,  I agree with her findings, examination as well as impression recommendations.  Converted to PO Lasix, breathing better. Plan was for Day 2 of Myoview today, unfortunately, she was given an enema prior to going down for the study & it was postponed until tomorrow.  Needs to stop smoking & no more Cocaine.  Suspect that Cocaine may be cause of reduced EF, but with RWMA & +Trop, ischemic evaluation is warranted.  Hopefully if Myoview is Negative, she will be ready for d/c tomorrow.   Leonie Man, M.D., M.S. Interventional Cardiologist   Pager # [phone]     '

## 2015-05-15 NOTE — Progress Notes (Signed)
Pt has not had a BM since 05/06/15.  Fleet enema was ordered and admin.  However, stress test (Lexiscan) Part 2 was cancelled today bec pt was given the enema stating that they could not start then stop test in case pt had to have a BM. Pt still has not had a BM.  I was informed that Tarri Fuller, PA-C would be placing new stress test orders to be performed tomorrow, 05/16/15.  Pt should be NPO on 05/16/15 at 0001 to prepare for this procedure.

## 2015-05-16 DIAGNOSIS — Z720 Tobacco use: Secondary | ICD-10-CM

## 2015-05-16 DIAGNOSIS — I248 Other forms of acute ischemic heart disease: Secondary | ICD-10-CM

## 2015-05-16 DIAGNOSIS — R7989 Other specified abnormal findings of blood chemistry: Secondary | ICD-10-CM

## 2015-05-16 DIAGNOSIS — E785 Hyperlipidemia, unspecified: Secondary | ICD-10-CM

## 2015-05-16 DIAGNOSIS — I10 Essential (primary) hypertension: Secondary | ICD-10-CM

## 2015-05-16 DIAGNOSIS — E1100 Type 2 diabetes mellitus with hyperosmolarity without nonketotic hyperglycemic-hyperosmolar coma (NKHHC): Secondary | ICD-10-CM

## 2015-05-16 DIAGNOSIS — J9601 Acute respiratory failure with hypoxia: Secondary | ICD-10-CM

## 2015-05-16 DIAGNOSIS — N179 Acute kidney failure, unspecified: Secondary | ICD-10-CM

## 2015-05-16 DIAGNOSIS — I5043 Acute on chronic combined systolic (congestive) and diastolic (congestive) heart failure: Principal | ICD-10-CM

## 2015-05-16 DIAGNOSIS — N183 Chronic kidney disease, stage 3 (moderate): Secondary | ICD-10-CM

## 2015-05-16 LAB — NM MYOCAR MULTI W/SPECT W/WALL MOTION / EF
CHL CUP NUCLEAR SRS: 3
CHL CUP STRESS STAGE 1 GRADE: 0 %
CHL CUP STRESS STAGE 2 SPEED: 0 mph
CHL CUP STRESS STAGE 3 SBP: 108 mmHg
CHL CUP STRESS STAGE 4 SPEED: 0 mph
CHL CUP STRESS STAGE 5 GRADE: 0 %
CHL CUP STRESS STAGE 5 HR: 79 {beats}/min
CSEPEW: 1 METS
LV dias vol: 201 mL
LVSYSVOL: 143 mL
Peak BP: 106 mmHg
Peak HR: 79 {beats}/min
Percent of predicted max HR: 50 %
RATE: 0.33
SDS: 7
SSS: 10
Stage 1 DBP: 77 mmHg
Stage 1 HR: 74 {beats}/min
Stage 1 SBP: 131 mmHg
Stage 1 Speed: 0 mph
Stage 2 Grade: 0 %
Stage 2 HR: 74 {beats}/min
Stage 3 DBP: 64 mmHg
Stage 3 Grade: 0 %
Stage 3 HR: 83 {beats}/min
Stage 3 Speed: 0 mph
Stage 4 DBP: 66 mmHg
Stage 4 Grade: 0 %
Stage 4 HR: 82 {beats}/min
Stage 4 SBP: 104 mmHg
Stage 5 DBP: 66 mmHg
Stage 5 SBP: 106 mmHg
Stage 5 Speed: 0 mph
TID: 1.09

## 2015-05-16 LAB — GLUCOSE, CAPILLARY
GLUCOSE-CAPILLARY: 229 mg/dL — AB (ref 65–99)
GLUCOSE-CAPILLARY: 260 mg/dL — AB (ref 65–99)
Glucose-Capillary: 183 mg/dL — ABNORMAL HIGH (ref 65–99)

## 2015-05-16 LAB — BASIC METABOLIC PANEL
Anion gap: 9 (ref 5–15)
BUN: 37 mg/dL — AB (ref 6–20)
CALCIUM: 9.2 mg/dL (ref 8.9–10.3)
CO2: 28 mmol/L (ref 22–32)
Chloride: 102 mmol/L (ref 101–111)
Creatinine, Ser: 1.61 mg/dL — ABNORMAL HIGH (ref 0.44–1.00)
GFR calc Af Amer: 38 mL/min — ABNORMAL LOW (ref >60)
GFR calc non Af Amer: 33 mL/min — ABNORMAL LOW (ref >60)
GLUCOSE: 160 mg/dL — AB (ref 65–99)
Potassium: 4.7 mmol/L (ref 3.5–5.1)
Sodium: 139 mmol/L (ref 135–145)

## 2015-05-16 MED ORDER — INSULIN GLARGINE 100 UNIT/ML SOLOSTAR PEN: 25.0000 [IU] | PEN_INJECTOR | Freq: Every day | SUBCUTANEOUS | Status: DC

## 2015-05-16 MED ORDER — TECHNETIUM TC 99M SESTAMIBI GENERIC - CARDIOLITE
30.0000 | Freq: Once | INTRAVENOUS | Status: AC | PRN
  Administered 2015-05-16: 30 via INTRAVENOUS

## 2015-05-16 MED ORDER — SODIUM CHLORIDE 0.9 % IV SOLN
INTRAVENOUS | Status: DC
  Administered 2015-05-17: 06:00:00 via INTRAVENOUS

## 2015-05-16 MED ORDER — REGADENOSON 0.4 MG/5ML IV SOLN
INTRAVENOUS | Status: AC
  Administered 2015-05-16: 0.4 mg via INTRAVENOUS
  Filled 2015-05-16: qty 5

## 2015-05-16 MED ORDER — ASPIRIN 81 MG PO CHEW
81.0000 mg | CHEWABLE_TABLET | ORAL | Status: AC
  Administered 2015-05-17: 81 mg via ORAL
  Filled 2015-05-16: qty 1

## 2015-05-16 NOTE — Progress Notes (Signed)
Patient: [name] [name] / Admit Date: 05/12/2015 / Date of Encounter: 05/16/2015, 9:41 AM   Subjective: No acute events overnight. No chest pain, tightness, or dyspnea. Her only complaint is constipation and abdominal distention, but overall says she feels well. Currently awaiting stress test.    Objective: Telemetry: NSR  Physical Exam: Blood pressure 124/62, pulse 70, temperature 98.2 F (36.8 C), temperature source Oral, resp. rate 16, height 5\' 5"  (1.651 m), weight 249 lb 14.4 oz (113.354 kg), SpO2 99 %. General: Obese, Well developed, well nourished F, in no acute distress. Laying about 20 degrees flat in bed Head: Normocephalic, atraumatic, sclera non-icteric, no xanthomas, nares are without discharge. Neck: Negative for carotid bruits. JVP not elevated at 45 degrees. Very prominent HJR.  Lungs: Clear bilaterally to auscultation without wheezes, rales, or rhonchi. Breathing is unlabored. Heart: RRR S1 S2 without murmurs, rubs, or gallops.  Abdomen: Soft, non-tender, moderately distended but soft with normoactive bowel sounds. No rebound/guarding. Extremities: No clubbing or cyanosis. No edema. Distal pedal pulses are 2+ and equal bilaterally. Neuro: Alert and oriented X 3. Moves all extremities spontaneously. Psych:  Responds to questions appropriately with a normal affect.   Intake/Output Summary (Last 24 hours) at 05/16/15 0941 Last data filed at 05/16/15 0824  Gross per 24 hour  Intake    876 ml  Output   1000 ml  Net   -124 ml    Inpatient Medications:  . antiseptic oral rinse  7 mL Mouth Rinse BID  . aspirin  325 mg Oral Daily  . brinzolamide  1 drop Both Eyes TID  . furosemide  40 mg Oral Daily  . gabapentin  300 mg Oral QHS  . heparin  5,000 Units Subcutaneous 3 times per day  . hydrALAZINE  50 mg Oral 3 times per day  . insulin aspart  0-15 Units Subcutaneous TID WC  . insulin glargine  30 Units Subcutaneous QHS  . isosorbide mononitrate  30 mg Oral Daily  .  latanoprost  1 drop Both Eyes QHS  . loratadine  10 mg Oral Daily  . metoprolol tartrate  25 mg Oral BID  . mometasone-formoterol  2 puff Inhalation BID  . polyethylene glycol  17 g Oral Daily  . pravastatin  40 mg Oral QHS  . regadenoson      . regadenoson  0.4 mg Intravenous Once  . senna-docusate  2 tablet Oral BID  . sodium chloride  3 mL Intravenous Q12H  . traMADol  50 mg Oral TID   Infusions:    Labs:  Recent Labs  05/14/15 0312 05/15/15 0231 05/16/15 0333  NA 141 140 139  K 4.5 4.6 4.7  CL 104 103 102  CO2 28 29 28   GLUCOSE 192* 184* 160*  BUN 40* 38* 37*  CREATININE 1.79* 1.63* 1.61*  CALCIUM 8.9 9.1 9.2  MG 2.5*  --   --    No results for input(s): AST, ALT, ALKPHOS, BILITOT, PROT, ALBUMIN in the last 72 hours. No results for input(s): WBC, NEUTROABS, HGB, HCT, MCV, PLT in the last 72 hours. No results for input(s): CKTOTAL, CKMB, TROPONINI in the last 72 hours. Invalid input(s): POCBNP No results for input(s): HGBA1C in the last 72 hours.   Radiology/Studies:  Dg Chest Portable 1 View  05/12/2015   CLINICAL DATA:  Respiratory distress.  EXAM: PORTABLE CHEST - 1 VIEW  COMPARISON:  10/18/2014  FINDINGS: Postoperative changes in the mediastinum. Mild cardiac enlargement with increased pulmonary vascularity. Perihilar interstitial  infiltrates consistent with edema. This is progressing since previous study. Also there is interval development of focal consolidation in the right lung base which may indicate superimposed pneumonia.  IMPRESSION: Progressive congestive changes since previous study with increasing pulmonary edema. Superimposed consolidation in the right lung base may indicate pneumonia.   Electronically Signed   By: Lucienne Capers M.D.   On: 05/12/2015 04:41     Assessment and Plan   1. Acute on chronic systolic HF- [id number] since admission. Weights don't really make sense - 270 on admission (?) but more recently has continued to fall -> 256->249 today. Her  abdominal distention and c/o constipation may be related to hypervolemia. She continues to report good UOP on oral Lasix thus will continue at present time. No further dyspnea. - Echo: EF 40-45%, mild LVH, hypokinesis of themid-apicalanteroseptal and apical myocardium, high ventricular filling pressure. Previous EF was 55% in July 2013(distal inferior/inferoseptal and apical hypokinesis). -Plan for 2 day stress test starting today (was unable to complete yesterday).  -Continue ASA, hydral/isordil, and metoprolol tartrate. Unable to tolerate ACEI/ARB  2/2 CKD.   2. CKD/AKI- creatinine trending down, 1.61 today (1.63). Monitor daily.   3. CAD- Troponin elevation is a nonspecific pattern. EF decreased with WMA. On for stress test for today - day 2 of 2. Agree that ischemic evaluation is warranted - she is s/p CABG & is pending upcoming plastic surgery on CABG scar.  4. Tobacco abuse/Cocaine abuse- Continues to smoke. Reports that she stopped using cocaine 5 months ago. Counseled on cessation and the extreme risks associated with concurrent beta blockade.   5. HTN- Systolic BPs running [id number], likely needs uptitration of hydralazine after stress test. No ACEI 2/2 renal dysfunction.   6. Morbid obesity- Needs nutrition counseling for obesity. Body mass index is 41.59 kg/(m^2).  Signed, Melina Copa PA-C Pager: [phone]   I have seen and evaluated the patient this afternoon after. She feels much better today having had a bowel movement no longer complaining about constipation. She has not had any further chest tightness or pressure or dyspnea. She is lying flat without difficulties. Mildly hypertensive today. Can reassess as an outpatient because her blood pressure looks actually better over the last few hours. She is on an ACE inhibitor but is on hydralazine and nitrate. I think with her renal function being more stable could consider switching his back over to an ACE inhibitor or ARB at  outpatient evaluation.  She had her stress test secondary today with results pending. Provided there is no evidence ischemia she should be ready for discharge today. If positive would need to discuss possible catheterization with the patient.   Leonie Man, M.D., M.S. Interventional Cardiologist   Pager # [phone]

## 2015-05-16 NOTE — Progress Notes (Addendum)
Nuc result reviewed with Dr. Ellyn Hack. No ischemia but increased wall motion abnormalities. Given + troponin and the need for definitive pre-op clearance for plastic surgery, we have recommended to proceed with cardiac cath in AM. Risks and benefits of cardiac catheterization have been discussed with the patient.  These include bleeding, infection, kidney damage, stroke, heart attack, death.  The patient understands these risks and is willing to proceed. I have discontinued Lasix for the AM. Will hydrate gently tomorrow starting at 0600 with 50cc/hr. Watch volume given CHF. Placed order in pre-cath orders for 1/2 dose insulin tonight. Dayna Dunn PA-C

## 2015-05-16 NOTE — Progress Notes (Signed)
[facility]                                                                              Patient Demographics  [name] [name], is a 64 y.o. female, DOB - 12/22/1951, [id number]  Admit date - 05/12/2015   Admitting Physician Lavina Hamman, MD  Outpatient Primary MD for the patient is Marlou Sa, ERIC, MD  LOS - 4   Chief Complaint  Patient presents with  . Respiratory Distress      HPI on 05/12/2015 by Dr. Berle Mull [name] [name] is a 64 y.o. female with Past medical history of hypertension, coronary artery disease status post CABG, history of CVA, dyslipidemia, diabetes mellitus, chronic kidney disease likely secondary to diabetes, history of substance abuse. The patient is presenting with complaints of shortness of breath with cough that started suddenly this afternoon. The patient mentions that she woke up from the middle of the night went to the restroom and while she was walking back she started having shortness of breath and was trying to use her nebulizer but then her breathing was progressively worsening and therefore she requested to be brought to the hospital. There is no chest pain or chest fever no chills. Patient has cough with yellow expectoration.  Patient has no worsening of leg swelling. Denies any diarrhea and actually complains of constipation without any bowel movement since last 4 days. Does not have any burning urination. Patient claims she is been taking all her medications regularly. Patient has been having a lot of stress in life recently. Patient actively smoking denies any alcohol or drug abuse at present  Assessment & Plan   Acute on chronic combined systolic and diastolic congestive heart failure -presented with sudden worsening of shortness of breath, wheezing, lower extremity edema, elevated blood pressure, sinus tachycardia in the 150's, hypoxia in the 70's, cxr consistent with pulmonary edema. -improved after placing her on BiPAP and giving her Lasix.   -Continue metoprolol, hydralazine/imdur, titrate for bp control, home meds lisinopril held due to worsening of cr, consider restart at discharge if renal function allows -Cardiology consulted and appreciated; recommended 2day Myoview -Lexiscan shows high risk study due to severely depressed overall systolic function and a fixed anteroapical defect  COPD -Presenting symptom more consistent with heart failure/flush pulmonary edema. -No wheezing today.  Uncontrolled diabetes mellitus, type 2  -Home meds metformin held, not a candidate to resume metformin at discharge due to renal impairment. -hemoglobin A1c 7.2 -Continue insulin- Lantus 25u and ISS, and will need to follow up with PCP   Chronic kidney disease, stage III -Creatinine seems to be at baseline -Avoid nephrotoxic agents -Would consider ACEI as Cr is stable, however should be discussed with PCP or Cardiology  H/o CAD s/p MI s/p 2vessel CABG in 2013, h/o CVA (right internal capsule, 2008) -Currently denies chest pain -Stress test as above  Chronic hip pain Continue tramadol and gabapentin.  H/o cocaine use -self reported used cocaine for 35years, quit 24months ago -Advised patient to stop  Tobacco Abuse -Smoking cessation counseling provided  Morbid obesity  -BMI 42 -Will need to speak to PCP regarding lifestyle modifications   Code Status: Full  Family Communication:  None at bedside  Disposition Plan: Admitted, pending further recommendations from cardiology.   Time Spent in minutes   30 minutes  Procedures  Echocardiogram Stress test/lexiscan  Consults   Cardiology  DVT Prophylaxis  heparin  Lab Results  Component Value Date   PLT 407* 05/12/2015    Medications  Scheduled Meds: . antiseptic oral rinse  7 mL Mouth Rinse BID  . aspirin  325 mg Oral Daily  . brinzolamide  1 drop Both Eyes TID  . furosemide  40 mg Oral Daily  . gabapentin  300 mg Oral QHS  . heparin  5,000 Units Subcutaneous 3  times per day  . hydrALAZINE  50 mg Oral 3 times per day  . insulin aspart  0-15 Units Subcutaneous TID WC  . insulin glargine  30 Units Subcutaneous QHS  . isosorbide mononitrate  30 mg Oral Daily  . latanoprost  1 drop Both Eyes QHS  . loratadine  10 mg Oral Daily  . metoprolol tartrate  25 mg Oral BID  . mometasone-formoterol  2 puff Inhalation BID  . polyethylene glycol  17 g Oral Daily  . pravastatin  40 mg Oral QHS  . regadenoson  0.4 mg Intravenous Once  . senna-docusate  2 tablet Oral BID  . sodium chloride  3 mL Intravenous Q12H  . traMADol  50 mg Oral TID   Continuous Infusions:  PRN Meds:.sodium chloride, acetaminophen, ipratropium-albuterol, magnesium hydroxide, sodium chloride  Antibiotics     Anti-infectives    None        Subjective:   [name] [name] seen and examined today.  Patient states she is breathing much better, denies chest pain, abdominal pain.  Feels the swelling in her legs has resolved.  Has not had a bowel movement and feels constipated.  Objective:   Filed Vitals:   05/15/15 2151 05/16/15 0007 05/16/15 0554 05/16/15 0640  BP: 152/111 140/60 124/62   Pulse: 93  70   Temp:   98.2 F (36.8 C)   TempSrc:   Oral   Resp:   16   Height:      Weight:    113.354 kg (249 lb 14.4 oz)  SpO2:   100%     Wt Readings from Last 3 Encounters:  05/16/15 113.354 kg (249 lb 14.4 oz)  01/20/15 118.389 kg (261 lb)  01/18/15 121.564 kg (268 lb)     Intake/Output Summary (Last 24 hours) at 05/16/15 [id number] Last data filed at 05/16/15 0200  Gross per 24 hour  Intake    876 ml  Output   1000 ml  Net   -124 ml    Exam  General: Well developed, well nourished, NAD, appears stated age  HEENT: NCAT,  mucous membranes moist.   Cardiovascular: S1 S2 auscultated, no rubs, murmurs or gallops. Regular rate and rhythm.  Respiratory: Clear to auscultation bilaterally with equal chest rise  Abdomen: Soft, obese, nontender, nondistended, + bowel  sounds  Extremities: warm dry without cyanosis clubbing or edema  Neuro: AAOx3, nonfocal  Psych: Normal affect and demeanor   Data Review   Micro Results Recent Results (from the past 240 hour(s))  MRSA PCR Screening     Status: None   Collection Time: 05/12/15  7:12 AM  Result Value Ref Range Status   MRSA by PCR NEGATIVE NEGATIVE Final    Comment:        The GeneXpert MRSA Assay (FDA approved for NASAL specimens only), is one component of a comprehensive  MRSA colonization surveillance program. It is not intended to diagnose MRSA infection nor to guide or monitor treatment for MRSA infections.     Radiology Reports Dg Chest Portable 1 View  05/12/2015   CLINICAL DATA:  Respiratory distress.  EXAM: PORTABLE CHEST - 1 VIEW  COMPARISON:  10/18/2014  FINDINGS: Postoperative changes in the mediastinum. Mild cardiac enlargement with increased pulmonary vascularity. Perihilar interstitial infiltrates consistent with edema. This is progressing since previous study. Also there is interval development of focal consolidation in the right lung base which may indicate superimposed pneumonia.  IMPRESSION: Progressive congestive changes since previous study with increasing pulmonary edema. Superimposed consolidation in the right lung base may indicate pneumonia.   Electronically Signed   By: Lucienne Capers M.D.   On: 05/12/2015 04:41    CBC  Recent Labs Lab 05/12/15 0404  WBC 7.3  HGB 12.6  HCT 41.3  PLT 407*  MCV 77.6*  MCH 23.7*  MCHC 30.5  RDW 18.7*  LYMPHSABS 4.0  MONOABS 0.5  EOSABS 0.2  BASOSABS 0.0    Chemistries   Recent Labs Lab 05/12/15 0404 05/12/15 0600 05/13/15 0311 05/14/15 0312 05/15/15 0231 05/16/15 0333  NA 136  --  137 141 140 139  K 4.6  --  4.3 4.5 4.6 4.7  CL 102  --  101 104 103 102  CO2 22  --  23 28 29 28   GLUCOSE 369*  --  220* 192* 184* 160*  BUN 22*  --  32* 40* 38* 37*  CREATININE 1.62*  --  1.70* 1.79* 1.63* 1.61*  CALCIUM 8.5*   --  8.9 8.9 9.1 9.2  MG  --  2.1 2.0 2.5*  --   --   AST 31  --   --   --   --   --   ALT 15  --   --   --   --   --   ALKPHOS 113  --   --   --   --   --   BILITOT 1.1  --   --   --   --   --    ------------------------------------------------------------------------------------------------------------------ estimated creatinine clearance is 44.4 mL/min (by C-G formula based on Cr of 1.61). ------------------------------------------------------------------------------------------------------------------ No results for input(s): HGBA1C in the last 72 hours. ------------------------------------------------------------------------------------------------------------------ No results for input(s): CHOL, HDL, LDLCALC, TRIG, CHOLHDL, LDLDIRECT in the last 72 hours. ------------------------------------------------------------------------------------------------------------------ No results for input(s): TSH, T4TOTAL, T3FREE, THYROIDAB in the last 72 hours.  Invalid input(s): FREET3 ------------------------------------------------------------------------------------------------------------------ No results for input(s): VITAMINB12, FOLATE, FERRITIN, TIBC, IRON, RETICCTPCT in the last 72 hours.  Coagulation profile  Recent Labs Lab 05/12/15 0600  INR 0.98    No results for input(s): DDIMER in the last 72 hours.  Cardiac Enzymes  Recent Labs Lab 05/12/15 0600 05/12/15 1137 05/12/15 1900  TROPONINI 0.04* 0.07* 0.06*   ------------------------------------------------------------------------------------------------------------------ Invalid input(s): POCBNP    Yasha Tibbett D.O. on 05/16/2015 at 8:08 AM  Between 7am to 7pm - Pager - [phone]  After 7pm go to [email or web address] - password [email or web address]  And look for the night coverage person covering for me after hours  [facility] Office  [phone]

## 2015-05-16 NOTE — Discharge Summary (Signed)
Physician Discharge Summary  HAASINI YAO [id number] DOB: 13-Oct-1951 DOA: 05/12/2015  PCP: Kevan Ny, MD  Admit date: 05/12/2015 Discharge date: 05/16/2015  Time spent: 45 minutes  Recommendations for Outpatient Follow-up:  Patient will be discharged to home.  Patient will need to follow up with primary care provider within one week of discharge. Patient will also need follow-up with cardiology, this appointment will be arranged by the office. Patient should continue medications as prescribed.  Patient should follow a heart healthy/carb modified diet.   Discharge Diagnoses:  Acute on chronic systolic and diastolic heart failure COPD Uncontrolled diabetes mellitus, type II Chronic kidney disease, stage III History of coronary artery disease Chronic hip pain History of cocaine abuse Tobacco abuse Morbid obesity  Discharge Condition: Stable  Diet recommendation: heart healthy/carb modified diet   Filed Weights   05/14/15 0624 05/15/15 0552 05/16/15 0640  Weight: 116.302 kg (256 lb 6.4 oz) 114.941 kg (253 lb 6.4 oz) 113.354 kg (249 lb 14.4 oz)    History of present illness:  on 05/12/2015 by Dr. Berle Mull KARELY LAMMERT is a 64 y.o. female with Past medical history of hypertension, coronary artery disease status post CABG, history of CVA, dyslipidemia, diabetes mellitus, chronic kidney disease likely secondary to diabetes, history of substance abuse. The patient is presenting with complaints of shortness of breath with cough that started suddenly this afternoon. The patient mentions that she woke up from the middle of the night went to the restroom and while she was walking back she started having shortness of breath and was trying to use her nebulizer but then her breathing was progressively worsening and therefore she requested to be brought to the hospital. There is no chest pain or chest fever no chills. Patient has cough with yellow expectoration. Patient has no worsening of  leg swelling. Denies any diarrhea and actually complains of constipation without any bowel movement since last 4 days. Does not have any burning urination. Patient claims she is been taking all her medications regularly. Patient has been having a lot of stress in life recently. Patient actively smoking denies any alcohol or drug abuse at present  Hospital Course:  Acute on chronic combined systolic and diastolic congestive heart failure -presented with sudden worsening of shortness of breath, wheezing, lower extremity edema, elevated blood pressure, sinus tachycardia in the 150's, hypoxia in the 70's, cxr consistent with pulmonary edema. -improved after placing her on BiPAP and giving her Lasix.  -Continue metoprolol, hydralazine/imdur, titrate for bp control, home meds lisinopril held due to worsening of cr, consider restart at discharge if renal function allows -Cardiology consulted and appreciated; recommended 2day Myoview -Lexiscan shows high risk study due to severely depressed overall systolic function and a fixed anteroapical defect -Cardiac catheterization today: Severe 2 vessel CAD of the LAD and circumflex, mild RCA stenosis, patency of the LIMA to the LAD, SVG-OM, SVG-left circumflex, diffuse distal vessel CAD involving the left circumflex branches and the LAD-diagonal beyond the LIMA insertion.  Recommendations- medical therapy -Continue lasix 40mg  daily, as needed for weight gain (as rec by cardiology) -patient to follow up with cardiology, office will arrange appt  COPD -Presenting symptom more consistent with heart failure/flush pulmonary edema. -No wheezing today.  Uncontrolled diabetes mellitus, type 2  -Home meds metformin held, not a candidate to resume metformin at discharge due to renal impairment. -hemoglobin A1c 7.2 -Continue insulin- Lantus 25u and ISS, and will need to follow up with PCP   Chronic kidney disease, stage III -  Creatinine seems to be at baseline,  currently 1.6 -Avoid nephrotoxic agents  H/o CAD s/p MI s/p 2vessel CABG in 2013, h/o CVA (right internal capsule, 2008) -Currently denies chest pain -Stress test and cardiac catheterization as above  Chronic hip pain Continue tramadol and gabapentin.  H/o cocaine use -self reported used cocaine for 35years, quit 52months ago -Advised patient to stop  Tobacco Abuse -Smoking cessation counseling provided  Morbid obesity  -BMI 42 -Will need to speak to PCP regarding lifestyle modifications   Procedures: Echocardiogram Lexiscan Cardiac catheterization  Consultations: Cardiology  Discharge Exam: Filed Vitals:   05/16/15 1118  BP: 106/66  Pulse:   Temp:   Resp:     General: Well developed, well nourished, No distress  HEENT: NCAT, mucous membranes moist.   Cardiovascular: S1 S2 auscultated, no rubs, murmurs or gallops. Regular rate and rhythm.  Respiratory: Clear to auscultation bilaterally with equal chest rise  Abdomen: Soft, obese, nontender, nondistended, + bowel sounds  Extremities: warm dry without cyanosis clubbing or edema  Neuro: AAOx3, nonfocal  Psych: Normal affect and demeanor  Discharge Instructions      Discharge Instructions    Face-to-face encounter (required for Medicare/Medicaid patients)    Complete by:  As directed   I Xu,Fang certify that this patient is under my care and that I, or a nurse practitioner or physician's assistant working with me, had a face-to-face encounter that meets the physician face-to-face encounter requirements with this patient on 05/14/2015. The encounter with the patient was in whole, or in part for the following medical condition(s) which is the primary reason for home health care (List medical condition): CHF/uncontrolled dm2, newly started on insulin  The encounter with the patient was in whole, or in part, for the following medical condition, which is the primary reason for home health care:  CHF/uncontrolled  DM2  I certify that, based on my findings, the following services are medically necessary home health services:  Nursing  Reason for Medically Necessary Home Health Services:  Skilled Nursing- Change/Decline in Patient Status  My clinical findings support the need for the above services:  Shortness of breath with activity  Further, I certify that my clinical findings support that this patient is homebound due to:  Shortness of Breath with activity     Home Health    Complete by:  As directed   To provide the following care/treatments:  RN            Medication List    STOP taking these medications        lisinopril 5 MG tablet  Commonly known as:  PRINIVIL,ZESTRIL     metFORMIN 500 MG tablet  Commonly known as:  GLUCOPHAGE      TAKE these medications        aspirin 325 MG EC tablet  Take 325 mg by mouth daily.     brinzolamide 1 % ophthalmic suspension  Commonly known as:  AZOPT  Place 1 drop into both eyes 3 (three) times daily.     cetirizine 10 MG tablet  Commonly known as:  ZYRTEC  Take 10 mg by mouth daily as needed.     Fluticasone-Salmeterol 250-50 MCG/DOSE Aepb  Commonly known as:  ADVAIR  Inhale 1 puff into the lungs every 12 (twelve) hours.     furosemide 40 MG tablet  Commonly known as:  LASIX  Take 40 mg by mouth daily.     gabapentin 100 MG capsule  Commonly known as:  NEURONTIN  Take 300 mg by mouth at bedtime.     glimepiride 4 MG tablet  Commonly known as:  AMARYL  Take 4 mg by mouth daily before breakfast.     hydrALAZINE 50 MG tablet  Commonly known as:  APRESOLINE  Take 1 tablet (50 mg total) by mouth every 8 (eight) hours.     Insulin Glargine 100 UNIT/ML Solostar Pen  Commonly known as:  LANTUS SOLOSTAR  Inject 25 Units into the skin daily at 10 pm.     isosorbide mononitrate 30 MG 24 hr tablet  Commonly known as:  IMDUR  Take 1 tablet (30 mg total) by mouth daily.     latanoprost 0.005 % ophthalmic solution  Commonly known as:   XALATAN  Place 1 drop into both eyes at bedtime.     metoprolol tartrate 25 MG tablet  Commonly known as:  LOPRESSOR  Take 1 tablet (25 mg total) by mouth 2 (two) times daily.     pravastatin 40 MG tablet  Commonly known as:  PRAVACHOL  Take 40 mg by mouth at bedtime.     traMADol 50 MG tablet  Commonly known as:  ULTRAM  Take 50 mg by mouth 3 (three) times daily.     VITAMIN B-12 PO  Take 1 tablet by mouth daily.       Allergies  Allergen Reactions  . Naproxen     rash      The results of significant diagnostics from this hospitalization (including imaging, microbiology, ancillary and laboratory) are listed below for reference.    Significant Diagnostic Studies: Dg Chest Portable 1 View  05/12/2015   CLINICAL DATA:  Respiratory distress.  EXAM: PORTABLE CHEST - 1 VIEW  COMPARISON:  10/18/2014  FINDINGS: Postoperative changes in the mediastinum. Mild cardiac enlargement with increased pulmonary vascularity. Perihilar interstitial infiltrates consistent with edema. This is progressing since previous study. Also there is interval development of focal consolidation in the right lung base which may indicate superimposed pneumonia.  IMPRESSION: Progressive congestive changes since previous study with increasing pulmonary edema. Superimposed consolidation in the right lung base may indicate pneumonia.   Electronically Signed   By: Lucienne Capers M.D.   On: 05/12/2015 04:41    Microbiology: Recent Results (from the past 240 hour(s))  MRSA PCR Screening     Status: None   Collection Time: 05/12/15  7:12 AM  Result Value Ref Range Status   MRSA by PCR NEGATIVE NEGATIVE Final    Comment:        The GeneXpert MRSA Assay (FDA approved for NASAL specimens only), is one component of a comprehensive MRSA colonization surveillance program. It is not intended to diagnose MRSA infection nor to guide or monitor treatment for MRSA infections.      Labs: Basic Metabolic  Panel:  Recent Labs Lab 05/12/15 0404 05/12/15 0600 05/13/15 0311 05/14/15 0312 05/15/15 0231 05/16/15 0333  NA 136  --  137 141 140 139  K 4.6  --  4.3 4.5 4.6 4.7  CL 102  --  101 104 103 102  CO2 22  --  23 28 29 28   GLUCOSE 369*  --  220* 192* 184* 160*  BUN 22*  --  32* 40* 38* 37*  CREATININE 1.62*  --  1.70* 1.79* 1.63* 1.61*  CALCIUM 8.5*  --  8.9 8.9 9.1 9.2  MG  --  2.1 2.0 2.5*  --   --    Liver Function Tests:  Recent  Labs Lab 05/12/15 0404  AST 31  ALT 15  ALKPHOS 113  BILITOT 1.1  PROT 6.7  ALBUMIN 3.2*   No results for input(s): LIPASE, AMYLASE in the last 168 hours. No results for input(s): AMMONIA in the last 168 hours. CBC:  Recent Labs Lab 05/12/15 0404  WBC 7.3  NEUTROABS 2.6  HGB 12.6  HCT 41.3  MCV 77.6*  PLT 407*   Cardiac Enzymes:  Recent Labs Lab 05/12/15 0404 05/12/15 0600 05/12/15 1137 05/12/15 1900  TROPONINI 0.03 0.04* 0.07* 0.06*   BNP: BNP (last 3 results)  Recent Labs  05/12/15 0404 05/14/15 0312  BNP 439.6* 471.1*    ProBNP (last 3 results)  Recent Labs  10/18/14 1637  PROBNP 2288.0*    CBG:  Recent Labs Lab 05/15/15 0551 05/15/15 1135 05/15/15 1605 05/15/15 2058 05/16/15 0547  GLUCAP 119* 110* 196* 160* 229*       Signed:  Sheyla Zaffino  [facility] 05/16/2015, 11:56 AM

## 2015-05-16 NOTE — Progress Notes (Signed)
Part of 2 (stress portion) of 2 day Lexiscan stress test completed without complication. Per [facility] staff, resting image was obtained on 5/16. Stress image could not be done yesterday as patient received enema prior to coming down to nuc med.   Pending final result by [facility] reader this afternoon.  Hilbert Corrigan PA Pager: [phone]

## 2015-05-17 ENCOUNTER — Encounter (HOSPITAL_COMMUNITY)
Admission: EM | Disposition: A | Discharge status: Home Health | Payer: Medicaid Other | Source: Home / Self Care | Attending: Internal Medicine

## 2015-05-17 DIAGNOSIS — Z951 Presence of aortocoronary bypass graft: Secondary | ICD-10-CM

## 2015-05-17 DIAGNOSIS — I2510 Atherosclerotic heart disease of native coronary artery without angina pectoris: Secondary | ICD-10-CM

## 2015-05-17 HISTORY — PX: CARDIAC CATHETERIZATION: SHX172

## 2015-05-17 LAB — BASIC METABOLIC PANEL
ANION GAP: 13 (ref 5–15)
BUN: 40 mg/dL — ABNORMAL HIGH (ref 6–20)
CHLORIDE: 99 mmol/L — AB (ref 101–111)
CO2: 25 mmol/L (ref 22–32)
CREATININE: 1.6 mg/dL — AB (ref 0.44–1.00)
Calcium: 8.9 mg/dL (ref 8.9–10.3)
GFR calc non Af Amer: 33 mL/min — ABNORMAL LOW (ref >60)
GFR, EST AFRICAN AMERICAN: 38 mL/min — AB (ref >60)
Glucose, Bld: 170 mg/dL — ABNORMAL HIGH (ref 65–99)
POTASSIUM: 4.8 mmol/L (ref 3.5–5.1)
SODIUM: 137 mmol/L (ref 135–145)

## 2015-05-17 LAB — GLUCOSE, CAPILLARY
Glucose-Capillary: 177 mg/dL — ABNORMAL HIGH (ref 65–99)
Glucose-Capillary: 137 mg/dL — ABNORMAL HIGH (ref 65–99)
Glucose-Capillary: 123 mg/dL — ABNORMAL HIGH (ref 65–99)

## 2015-05-17 LAB — CBC
HEMATOCRIT: 37 % (ref 36.0–46.0)
Hemoglobin: 11.3 g/dL — ABNORMAL LOW (ref 12.0–15.0)
MCH: 23.2 pg — ABNORMAL LOW (ref 26.0–34.0)
MCHC: 30.5 g/dL (ref 30.0–36.0)
MCV: 76 fL — AB (ref 78.0–100.0)
Platelets: 345 10*3/uL (ref 150–400)
RBC: 4.87 MIL/uL (ref 3.87–5.11)
RDW: 18.7 % — AB (ref 11.5–15.5)
WBC: 6.3 10*3/uL (ref 4.0–10.5)

## 2015-05-17 SURGERY — LEFT HEART CATH AND CORS/GRAFTS ANGIOGRAPHY
Anesthesia: LOCAL

## 2015-05-17 MED ORDER — FENTANYL CITRATE (PF) 100 MCG/2ML IJ SOLN
INTRAMUSCULAR | Status: AC
  Filled 2015-05-17: qty 2

## 2015-05-17 MED ORDER — MIDAZOLAM HCL 2 MG/2ML IJ SOLN
INTRAMUSCULAR | Status: AC
  Filled 2015-05-17: qty 2

## 2015-05-17 MED ORDER — SODIUM CHLORIDE 0.9 % IJ SOLN: 3.0000 mL | INTRAMUSCULAR | Status: DC | PRN

## 2015-05-17 MED ORDER — SODIUM CHLORIDE 0.9 % IJ SOLN: 3.0000 mL | Freq: Two times a day (BID) | INTRAMUSCULAR | Status: DC

## 2015-05-17 MED ORDER — SODIUM CHLORIDE 0.9 % IJ SOLN
INTRAMUSCULAR | Status: DC | PRN
  Administered 2015-05-17: 15:00:00 via INTRA_ARTERIAL

## 2015-05-17 MED ORDER — HEPARIN (PORCINE) IN NACL 2-0.9 UNIT/ML-% IJ SOLN
INTRAMUSCULAR | Status: AC
  Filled 2015-05-17: qty 1000

## 2015-05-17 MED ORDER — SODIUM CHLORIDE 0.9 % IV SOLN: 250.0000 mL | INTRAVENOUS | Status: DC | PRN

## 2015-05-17 MED ORDER — IOHEXOL 350 MG/ML SOLN
INTRAVENOUS | Status: DC | PRN
  Administered 2015-05-17: 60 mL via INTRA_ARTERIAL

## 2015-05-17 MED ORDER — ONDANSETRON HCL 4 MG/2ML IJ SOLN: 4.0000 mg | Freq: Four times a day (QID) | INTRAMUSCULAR | Status: DC | PRN

## 2015-05-17 MED ORDER — LIDOCAINE HCL (PF) 1 % IJ SOLN
INTRAMUSCULAR | Status: AC
  Filled 2015-05-17: qty 30

## 2015-05-17 MED ORDER — HEPARIN SODIUM (PORCINE) 1000 UNIT/ML IJ SOLN
INTRAMUSCULAR | Status: AC
  Filled 2015-05-17: qty 1

## 2015-05-17 MED ORDER — HEPARIN SODIUM (PORCINE) 1000 UNIT/ML IJ SOLN
INTRAMUSCULAR | Status: DC | PRN
  Administered 2015-05-17: 5000 [IU] via INTRAVENOUS

## 2015-05-17 MED ORDER — VERAPAMIL HCL 2.5 MG/ML IV SOLN
INTRAVENOUS | Status: AC
  Filled 2015-05-17: qty 2

## 2015-05-17 MED ORDER — FUROSEMIDE 40 MG PO TABS: 40.0000 mg | ORAL_TABLET | Freq: Every day | ORAL | Status: DC

## 2015-05-17 MED ORDER — SODIUM CHLORIDE 0.9 % IV SOLN
INTRAVENOUS | Status: DC
  Administered 2015-05-17: 16:00:00 via INTRAVENOUS

## 2015-05-17 MED ORDER — MIDAZOLAM HCL 2 MG/2ML IJ SOLN
INTRAMUSCULAR | Status: DC | PRN
  Administered 2015-05-17: 2 mg via INTRAVENOUS

## 2015-05-17 MED ORDER — FENTANYL CITRATE (PF) 100 MCG/2ML IJ SOLN
INTRAMUSCULAR | Status: DC | PRN
  Administered 2015-05-17: 25 ug via INTRAVENOUS

## 2015-05-17 SURGICAL SUPPLY — 11 items
CATH INFINITI 5 FR AL2 (CATHETERS) ×1 IMPLANT
CATH INFINITI 5 FR IM (CATHETERS) ×1 IMPLANT
CATH INFINITI 5FR JL4 (CATHETERS) ×1 IMPLANT
CATH INFINITI JR4 5F (CATHETERS) ×2 IMPLANT
DEVICE RAD COMP TR BAND LRG (VASCULAR PRODUCTS) ×2 IMPLANT
GLIDESHEATH SLEND SS 6F .021 (SHEATH) ×2 IMPLANT
KIT HEART LEFT (KITS) ×2 IMPLANT
PACK CARDIAC CATHETERIZATION (CUSTOM PROCEDURE TRAY) ×2 IMPLANT
TRANSDUCER W/STOPCOCK (MISCELLANEOUS) ×2 IMPLANT
TUBING CIL FLEX 10 FLL-RA (TUBING) ×2 IMPLANT
WIRE SAFE-T 1.5MM-J .035X260CM (WIRE) ×2 IMPLANT

## 2015-05-17 NOTE — Progress Notes (Signed)
Patient: SANANTHA PROFFER / Admit Date: 05/12/2015 / Date of Encounter: 05/17/2015, 5:05 PM   Subjective: No acute events overnight. No chest pain, tightness, or dyspnea. Her only complaint is constipation and abdominal distention, but overall says she feels well.  Upset that Myoview was abnormal - HIGH RISK    Objective Telemetry: NSR  Physical Exam: Blood pressure 131/60, pulse 72, temperature 98 F (36.7 C), temperature source Oral, resp. rate 18, height 5\' 5"  (1.651 m), weight 112.674 kg (248 lb 6.4 oz), SpO2 100 %. General: Obese, Well developed, well nourished F, in no acute distress. Laying about 20 degrees flat in bed Head: Normocephalic, atraumatic, sclera non-icteric, no xanthomas, nares are without discharge. Neck: Negative for carotid bruits. JVP not elevated at 45 degrees. Very prominent HJR.  Lungs: Clear bilaterally to auscultation without wheezes, rales, or rhonchi. Breathing is unlabored. Heart: RRR S1 S2 without murmurs, rubs, or gallops.  Abdomen: Soft, non-tender, moderately distended but soft with NABSs. No rebound/guarding. Extremities: No C/C/E. Distal pedal pulses are 2+ and equal bilaterally. Neuro: Alert and oriented X 3. Moves all extremities spontaneously. Psych:  Responds to questions appropriately with a normal affect. pleasant   Intake/Output Summary (Last 24 hours) at 05/17/15 1705 Last data filed at 05/17/15 1649  Gross per 24 hour  Intake 984.17 ml  Output    150 ml  Net 834.17 ml    Inpatient Medications:  . antiseptic oral rinse  7 mL Mouth Rinse BID  . aspirin  325 mg Oral Daily  . brinzolamide  1 drop Both Eyes TID  . gabapentin  300 mg Oral QHS  . heparin  5,000 Units Subcutaneous 3 times per day  . hydrALAZINE  50 mg Oral 3 times per day  . insulin aspart  0-15 Units Subcutaneous TID WC  . insulin glargine  30 Units Subcutaneous QHS  . isosorbide mononitrate  30 mg Oral Daily  . latanoprost  1 drop Both Eyes QHS  . loratadine  10 mg Oral  Daily  . metoprolol tartrate  25 mg Oral BID  . mometasone-formoterol  2 puff Inhalation BID  . polyethylene glycol  17 g Oral Daily  . pravastatin  40 mg Oral QHS  . senna-docusate  2 tablet Oral BID  . sodium chloride  3 mL Intravenous Q12H  . sodium chloride  3 mL Intravenous Q12H  . traMADol  50 mg Oral TID   Infusions:  . sodium chloride 50 mL/hr at 05/17/15 0531  . sodium chloride 100 mL/hr at 05/17/15 1615    Labs:  Recent Labs  05/16/15 0333 05/17/15 0526  NA 139 137  K 4.7 4.8  CL 102 99*  CO2 28 25  GLUCOSE 160* 170*  BUN 37* 40*  CREATININE 1.61* 1.60*  CALCIUM 9.2 8.9   No results for input(s): AST, ALT, ALKPHOS, BILITOT, PROT, ALBUMIN in the last 72 hours.  Recent Labs  05/17/15 0526  WBC 6.3  HGB 11.3*  HCT 37.0  MCV 76.0*  PLT 345   No results for input(s): CKTOTAL, CKMB, TROPONINI in the last 72 hours. Invalid input(s): POCBNP No results for input(s): HGBA1C in the last 72 hours.   Radiology/Studies:  Nm Myocar Multi W/spect W/wall Motion / Ef  05/16/2015    The left ventricular ejection fraction is severely decreased (<30%).  This is a high risk study.  Findings consistent with prior myocardial infarction.  Defect 1: There is a medium defect present in the mid anterior, apical  anterior  and apex location.  There was no ST segment deviation noted during stress.   High risk nuclear study due to severely depressed overall systolic  function and a fixed anteroapical defect. Reversible ischemia is not  appreciated. The degree of LV dysfunction is disproportionate to the  relatively small perfusion abnormality. Consider "balanced ischemia" or  superimposed nonischemic cardiomyopathy.   Dg Chest Portable 1 View  05/12/2015   CLINICAL DATA:  Respiratory distress.  EXAM: PORTABLE CHEST - 1 VIEW  COMPARISON:  10/18/2014  FINDINGS: Postoperative changes in the mediastinum. Mild cardiac enlargement with increased pulmonary vascularity. Perihilar  interstitial infiltrates consistent with edema. This is progressing since previous study. Also there is interval development of focal consolidation in the right lung base which may indicate superimposed pneumonia.  IMPRESSION: Progressive congestive changes since previous study with increasing pulmonary edema. Superimposed consolidation in the right lung base may indicate pneumonia.   Electronically Signed   By: Lucienne Capers M.D.   On: 05/12/2015 04:41    MYOVIEW FROM YESTERDAY REVIEWED  The left ventricular ejection fraction is severely decreased (<30%).  This is a high risk study.  Findings consistent with prior myocardial infarction.  Defect 1: There is a medium defect present in the mid anterior, apical anterior and apex location.  There was no ST segment deviation noted during stress.  High risk nuclear study due to severely depressed overall systolic function and a fixed anteroapical defect. Reversible ischemia is not appreciated. The degree of LV dysfunction is disproportionate to the relatively small perfusion abnormality. Consider "balanced ischemia" or superimposed nonischemic cardiomyopathy.  CATH TODAY:  CONCLUSIONS:  SEVERE 2 VESSEL CAD OF THE LAD AND LCX  MILD RCA STENOSIS (SMALL VESSEL)  S/P CABG WITH CONTINUED PATENCY OF THE LIMA-LAD, SVG-OM, AND SVG-LEFT CIRCUMFLEX  DIFFUSE DISTAL VESSEL CAD INVOLVING THE LCX BRANCHES AND LAD/DIAGONAL BEYOND THE LIMA INSERTION  -- EXPLAINS ABNORMAL NUCLEAR ST FINDINGS  RECOMMENDATIONS:  MEDICAL THERAPY   Assessment and Plan   1. Acute on chronic systolic HF- -[id number] since admission. Weights don't really make sense - 270 on admission (?) but more recently has continued to fall -> 256->249 today. Her abdominal distention and c/o constipation may be related to hypervolemia. She continues to report good UOP on oral Lasix thus will continue at present time. No further dyspnea. - Echo: EF 40-45%, mild LVH, hypokinesis of  themid-apicalanteroseptal and apical myocardium, high ventricular filling pressure. Previous EF was 55% in July 2013(distal inferior/inferoseptal and apical hypokinesis). -CATH WITH PATENT GRAFTS BUT POOR TARGET VESSELS - NO PCI OPTIONS -- would not consider this to be an obstruction for upcoming Sgx. -Continue ASA, hydral/isordil, and metoprolol tartrate. Unable to tolerate ACEI/ARB  2/2 CKD.  Would restart PO Lasix @ home dose   2. CKD/AKI- creatinine trending down, 1.61 today (1.63). Monitor daily.   3. CAD- Troponin elevation is a nonspecific pattern. EF decreased with WMA. On for stress test for today - day 2 of 2. Agree that ischemic evaluation is warranted - she is s/p CABG & is pending upcoming plastic surgery on CABG scar. --> see cath results  4. Tobacco abuse/Cocaine abuse- Continues to smoke. Reports that she stopped using cocaine 5 months ago. Counseled on cessation and the extreme risks associated with concurrent beta blockade.   5. HTN- Systolic BPs running [id number], likely needs uptitration of hydralazine after stress test. No ACEI 2/2 renal dysfunction.   6. Morbid obesity- Needs nutrition counseling for obesity. Body mass index is 41.34 kg/(m^2).   I  have seen and evaluated the patient this afternoon both before & after her cath.  She is very relieved with the findings - we discussed results. She feels much better today having had a bowel movement no longer complaining about constipation. - She has not had any further chest tightness or pressure or dyspnea. She is lying flat without difficulties. Mildly hypertensive today. Can reassess as an outpatient because her blood pressure looks actually better over the last few hours. She is on an ACE inhibitor but is on hydralazine and nitrate. I think with her renal function being more stable could consider switching his back over to an ACE inhibitor or ARB at outpatient evaluation.  - Is stable for d/c today after TR band  removal. We will arrange ROV with Dr. [place].   Leonie Man, M.D., M.S. Interventional Cardiologist   Pager # [phone]

## 2015-05-17 NOTE — Progress Notes (Signed)
Pt  discharged with discharge instructions explained. Educated and explained post op care of TR band and instructed to call [facility] ED if bleeding on rt wrist occurs. Pt brought to lobby by NT without any incident.

## 2015-05-17 NOTE — Progress Notes (Signed)
Rt band removed. Level 0,. Pulses palpable.rt hand color warmwith good color + sensation. No bleeding noted. Applied tagaderm dsg. Instructed post tr band care no lifting on rt arm for 24 hrs. Elevated rt arm .

## 2015-05-17 NOTE — Progress Notes (Signed)
[facility]                                                                              Patient Demographics  [name] [name], is a 64 y.o. female, DOB - 03-Aug-1951, [id number]  Admit date - 05/12/2015   Admitting Physician Lavina Hamman, MD  Outpatient Primary MD for the patient is Marlou Sa, ERIC, MD  LOS - 5   Chief Complaint  Patient presents with  . Respiratory Distress      HPI on 05/12/2015 by Dr. Berle Mull [name] [name] is a 64 y.o. female with Past medical history of hypertension, coronary artery disease status post CABG, history of CVA, dyslipidemia, diabetes mellitus, chronic kidney disease likely secondary to diabetes, history of substance abuse. The patient is presenting with complaints of shortness of breath with cough that started suddenly this afternoon. The patient mentions that she woke up from the middle of the night went to the restroom and while she was walking back she started having shortness of breath and was trying to use her nebulizer but then her breathing was progressively worsening and therefore she requested to be brought to the hospital. There is no chest pain or chest fever no chills. Patient has cough with yellow expectoration.  Patient has no worsening of leg swelling. Denies any diarrhea and actually complains of constipation without any bowel movement since last 4 days. Does not have any burning urination. Patient claims she is been taking all her medications regularly. Patient has been having a lot of stress in life recently. Patient actively smoking denies any alcohol or drug abuse at present  Assessment & Plan   Acute on chronic combined systolic and diastolic congestive heart failure -presented with sudden worsening of shortness of breath, wheezing, lower extremity edema, elevated blood pressure, sinus tachycardia in the 150's, hypoxia in the 70's, cxr consistent with pulmonary edema. -improved after placing her on BiPAP and giving her Lasix.   -Continue metoprolol, hydralazine/imdur, titrate for bp control, home meds lisinopril held due to worsening of cr, consider restart at discharge if renal function allows -Cardiology consulted and appreciated; recommended 2day Myoview -Lexiscan shows high risk study due to severely depressed overall systolic function and a fixed anteroapical defect -Plan for cardiac catheterization today  COPD -Presenting symptom more consistent with heart failure/flush pulmonary edema. -No wheezing today.  Uncontrolled diabetes mellitus, type 2  -Home meds metformin held, not a candidate to resume metformin at discharge due to renal impairment. -hemoglobin A1c 7.2 -Continue insulin- Lantus 25u and ISS, and will need to follow up with PCP   Chronic kidney disease, stage III -Creatinine seems to be at baseline, currently 1.6 -Avoid nephrotoxic agents  H/o CAD s/p MI s/p 2vessel CABG in 2013, h/o CVA (right internal capsule, 2008) -Currently denies chest pain -Stress test as above  Chronic hip pain Continue tramadol and gabapentin.  H/o cocaine use -self reported used cocaine for 35years, quit 64months ago -Advised patient to stop  Tobacco Abuse -Smoking cessation counseling provided  Morbid obesity  -BMI 42 -Will need to speak to PCP regarding lifestyle modifications   Code Status: Full  Family Communication: None at bedside  Disposition Plan: Admitted, pending  cardiac catheterization  Time Spent in minutes   25 minutes  Procedures  Echocardiogram Stress test/lexiscan  Consults   Cardiology  DVT Prophylaxis  heparin  Lab Results  Component Value Date   PLT 345 05/17/2015    Medications  Scheduled Meds: . antiseptic oral rinse  7 mL Mouth Rinse BID  . aspirin  325 mg Oral Daily  . brinzolamide  1 drop Both Eyes TID  . gabapentin  300 mg Oral QHS  . heparin  5,000 Units Subcutaneous 3 times per day  . hydrALAZINE  50 mg Oral 3 times per day  . insulin aspart  0-15  Units Subcutaneous TID WC  . insulin glargine  30 Units Subcutaneous QHS  . isosorbide mononitrate  30 mg Oral Daily  . latanoprost  1 drop Both Eyes QHS  . loratadine  10 mg Oral Daily  . metoprolol tartrate  25 mg Oral BID  . mometasone-formoterol  2 puff Inhalation BID  . polyethylene glycol  17 g Oral Daily  . pravastatin  40 mg Oral QHS  . senna-docusate  2 tablet Oral BID  . sodium chloride  3 mL Intravenous Q12H  . traMADol  50 mg Oral TID   Continuous Infusions: . sodium chloride 50 mL/hr at 05/17/15 0531   PRN Meds:.sodium chloride, acetaminophen, ipratropium-albuterol, sodium chloride  Antibiotics     Anti-infectives    None        Subjective:   [name] [name] seen and examined today.  Patient states she is breathing much better, denies chest pain, abdominal pain.  Patient is ready to have her cath today.    Objective:   Filed Vitals:   05/16/15 2053 05/17/15 0511 05/17/15 0827 05/17/15 1037  BP:  134/74  127/78  Pulse: 82 75  83  Temp:  98.8 F (37.1 C)    TempSrc:  Oral    Resp: 18 17    Height:      Weight:  112.674 kg (248 lb 6.4 oz)    SpO2: 98% 98% 97%     Wt Readings from Last 3 Encounters:  05/17/15 112.674 kg (248 lb 6.4 oz)  01/20/15 118.389 kg (261 lb)  01/18/15 121.564 kg (268 lb)     Intake/Output Summary (Last 24 hours) at 05/17/15 1119 Last data filed at 05/17/15 1040  Gross per 24 hour  Intake 854.17 ml  Output    150 ml  Net 704.17 ml    Exam  General: Well developed, well nourished, No distress  HEENT: NCAT,  mucous membranes moist.   Cardiovascular: S1 S2 auscultated, no murmurs, RRR  Respiratory: Clear to auscultation bilaterally with equal chest rise  Abdomen: Soft, obese, nontender, nondistended, + bowel sounds  Extremities: warm dry without cyanosis clubbing or edema  Neuro: AAOx3, nonfocal  Psych: Normal affect and demeanor    Data Review   Micro Results Recent Results (from the past 240 hour(s))  MRSA  PCR Screening     Status: None   Collection Time: 05/12/15  7:12 AM  Result Value Ref Range Status   MRSA by PCR NEGATIVE NEGATIVE Final    Comment:        The GeneXpert MRSA Assay (FDA approved for NASAL specimens only), is one component of a comprehensive MRSA colonization surveillance program. It is not intended to diagnose MRSA infection nor to guide or monitor treatment for MRSA infections.     Radiology Reports Nm Myocar Multi W/spect W/wall Motion / Ef  05/16/2015  The left ventricular ejection fraction is severely decreased (<30%).  This is a high risk study.  Findings consistent with prior myocardial infarction.  Defect 1: There is a medium defect present in the mid anterior, apical  anterior and apex location.  There was no ST segment deviation noted during stress.   High risk nuclear study due to severely depressed overall systolic  function and a fixed anteroapical defect. Reversible ischemia is not  appreciated. The degree of LV dysfunction is disproportionate to the  relatively small perfusion abnormality. Consider "balanced ischemia" or  superimposed nonischemic cardiomyopathy.   Dg Chest Portable 1 View  05/12/2015   CLINICAL DATA:  Respiratory distress.  EXAM: PORTABLE CHEST - 1 VIEW  COMPARISON:  10/18/2014  FINDINGS: Postoperative changes in the mediastinum. Mild cardiac enlargement with increased pulmonary vascularity. Perihilar interstitial infiltrates consistent with edema. This is progressing since previous study. Also there is interval development of focal consolidation in the right lung base which may indicate superimposed pneumonia.  IMPRESSION: Progressive congestive changes since previous study with increasing pulmonary edema. Superimposed consolidation in the right lung base may indicate pneumonia.   Electronically Signed   By: Lucienne Capers M.D.   On: 05/12/2015 04:41    CBC  Recent Labs Lab 05/12/15 0404 05/17/15 0526  WBC 7.3 6.3  HGB 12.6  11.3*  HCT 41.3 37.0  PLT 407* 345  MCV 77.6* 76.0*  MCH 23.7* 23.2*  MCHC 30.5 30.5  RDW 18.7* 18.7*  LYMPHSABS 4.0  --   MONOABS 0.5  --   EOSABS 0.2  --   BASOSABS 0.0  --     Chemistries   Recent Labs Lab 05/12/15 0404 05/12/15 0600 05/13/15 0311 05/14/15 0312 05/15/15 0231 05/16/15 0333 05/17/15 0526  NA 136  --  137 141 140 139 137  K 4.6  --  4.3 4.5 4.6 4.7 4.8  CL 102  --  101 104 103 102 99*  CO2 22  --  23 28 29 28 25   GLUCOSE 369*  --  220* 192* 184* 160* 170*  BUN 22*  --  32* 40* 38* 37* 40*  CREATININE 1.62*  --  1.70* 1.79* 1.63* 1.61* 1.60*  CALCIUM 8.5*  --  8.9 8.9 9.1 9.2 8.9  MG  --  2.1 2.0 2.5*  --   --   --   AST 31  --   --   --   --   --   --   ALT 15  --   --   --   --   --   --   ALKPHOS 113  --   --   --   --   --   --   BILITOT 1.1  --   --   --   --   --   --    ------------------------------------------------------------------------------------------------------------------ estimated creatinine clearance is 44.5 mL/min (by C-G formula based on Cr of 1.6). ------------------------------------------------------------------------------------------------------------------ No results for input(s): HGBA1C in the last 72 hours. ------------------------------------------------------------------------------------------------------------------ No results for input(s): CHOL, HDL, LDLCALC, TRIG, CHOLHDL, LDLDIRECT in the last 72 hours. ------------------------------------------------------------------------------------------------------------------ No results for input(s): TSH, T4TOTAL, T3FREE, THYROIDAB in the last 72 hours.  Invalid input(s): FREET3 ------------------------------------------------------------------------------------------------------------------ No results for input(s): VITAMINB12, FOLATE, FERRITIN, TIBC, IRON, RETICCTPCT in the last 72 hours.  Coagulation profile  Recent Labs Lab 05/12/15 0600  INR 0.98    No results  for input(s): DDIMER in the last 72 hours.  Cardiac Enzymes  Recent Labs Lab  05/12/15 0600 05/12/15 1137 05/12/15 1900  TROPONINI 0.04* 0.07* 0.06*   ------------------------------------------------------------------------------------------------------------------ Invalid input(s): POCBNP    [name] [name] D.O. on 05/17/2015 at 11:19 AM  Between 7am to 7pm - Pager - [phone]  After 7pm go to [email or web address] - password [email or web address]  And look for the night coverage person covering for me after hours  [facility] Office  [phone]

## 2015-05-17 NOTE — Interval H&P Note (Signed)
History and Physical Interval Note:  05/17/2015 3:17 PM  [name] [name]  has presented today for surgery, with the diagnosis of cp  The various methods of treatment have been discussed with the patient and family. After consideration of risks, benefits and other options for treatment, the patient has consented to  Procedure(s): Left Heart Cath and Cors/Grafts Angiography (N/A) as a surgical intervention .  The patient's history has been reviewed, patient examined, no change in status, stable for surgery.  I have reviewed the patient's chart and labs.  Questions were answered to the patient's satisfaction.    Cath Lab Visit (complete for each Cath Lab visit)  Clinical Evaluation Leading to the Procedure:   ACS: No.  Non-ACS:    Anginal Classification: CCS III  Anti-ischemic medical therapy: Maximal Therapy (2 or more classes of medications)  Non-Invasive Test Results: High-risk stress test findings: cardiac mortality >3%/year  Prior CABG: No previous CABG       [name] [name]

## 2015-05-17 NOTE — H&P (View-Only) (Signed)
Patient: [name] [name] / Admit Date: 05/12/2015 / Date of Encounter: 05/16/2015, 9:41 AM   Subjective: No acute events overnight. No chest pain, tightness, or dyspnea. Her only complaint is constipation and abdominal distention, but overall says she feels well. Currently awaiting stress test.    Objective: Telemetry: NSR  Physical Exam: Blood pressure 124/62, pulse 70, temperature 98.2 F (36.8 C), temperature source Oral, resp. rate 16, height 5\' 5"  (1.651 m), weight 249 lb 14.4 oz (113.354 kg), SpO2 99 %. General: Obese, Well developed, well nourished F, in no acute distress. Laying about 20 degrees flat in bed Head: Normocephalic, atraumatic, sclera non-icteric, no xanthomas, nares are without discharge. Neck: Negative for carotid bruits. JVP not elevated at 45 degrees. Very prominent HJR.  Lungs: Clear bilaterally to auscultation without wheezes, rales, or rhonchi. Breathing is unlabored. Heart: RRR S1 S2 without murmurs, rubs, or gallops.  Abdomen: Soft, non-tender, moderately distended but soft with normoactive bowel sounds. No rebound/guarding. Extremities: No clubbing or cyanosis. No edema. Distal pedal pulses are 2+ and equal bilaterally. Neuro: Alert and oriented X 3. Moves all extremities spontaneously. Psych:  Responds to questions appropriately with a normal affect.   Intake/Output Summary (Last 24 hours) at 05/16/15 0941 Last data filed at 05/16/15 0824  Gross per 24 hour  Intake    876 ml  Output   1000 ml  Net   -124 ml    Inpatient Medications:  . antiseptic oral rinse  7 mL Mouth Rinse BID  . aspirin  325 mg Oral Daily  . brinzolamide  1 drop Both Eyes TID  . furosemide  40 mg Oral Daily  . gabapentin  300 mg Oral QHS  . heparin  5,000 Units Subcutaneous 3 times per day  . hydrALAZINE  50 mg Oral 3 times per day  . insulin aspart  0-15 Units Subcutaneous TID WC  . insulin glargine  30 Units Subcutaneous QHS  . isosorbide mononitrate  30 mg Oral Daily  .  latanoprost  1 drop Both Eyes QHS  . loratadine  10 mg Oral Daily  . metoprolol tartrate  25 mg Oral BID  . mometasone-formoterol  2 puff Inhalation BID  . polyethylene glycol  17 g Oral Daily  . pravastatin  40 mg Oral QHS  . regadenoson      . regadenoson  0.4 mg Intravenous Once  . senna-docusate  2 tablet Oral BID  . sodium chloride  3 mL Intravenous Q12H  . traMADol  50 mg Oral TID   Infusions:    Labs:  Recent Labs  05/14/15 0312 05/15/15 0231 05/16/15 0333  NA 141 140 139  K 4.5 4.6 4.7  CL 104 103 102  CO2 28 29 28   GLUCOSE 192* 184* 160*  BUN 40* 38* 37*  CREATININE 1.79* 1.63* 1.61*  CALCIUM 8.9 9.1 9.2  MG 2.5*  --   --    No results for input(s): AST, ALT, ALKPHOS, BILITOT, PROT, ALBUMIN in the last 72 hours. No results for input(s): WBC, NEUTROABS, HGB, HCT, MCV, PLT in the last 72 hours. No results for input(s): CKTOTAL, CKMB, TROPONINI in the last 72 hours. Invalid input(s): POCBNP No results for input(s): HGBA1C in the last 72 hours.   Radiology/Studies:  Dg Chest Portable 1 View  05/12/2015   CLINICAL DATA:  Respiratory distress.  EXAM: PORTABLE CHEST - 1 VIEW  COMPARISON:  10/18/2014  FINDINGS: Postoperative changes in the mediastinum. Mild cardiac enlargement with increased pulmonary vascularity. Perihilar interstitial  infiltrates consistent with edema. This is progressing since previous study. Also there is interval development of focal consolidation in the right lung base which may indicate superimposed pneumonia.  IMPRESSION: Progressive congestive changes since previous study with increasing pulmonary edema. Superimposed consolidation in the right lung base may indicate pneumonia.   Electronically Signed   By: Lucienne Capers M.D.   On: 05/12/2015 04:41     Assessment and Plan   1. Acute on chronic systolic HF- -[id number] since admission. Weights don't really make sense - 270 on admission (?) but more recently has continued to fall -> 256->249 today. Her  abdominal distention and c/o constipation may be related to hypervolemia. She continues to report good UOP on oral Lasix thus will continue at present time. No further dyspnea. - Echo: EF 40-45%, mild LVH, hypokinesis of themid-apicalanteroseptal and apical myocardium, high ventricular filling pressure. Previous EF was 55% in July 2013(distal inferior/inferoseptal and apical hypokinesis). -Plan for 2 day stress test starting today (was unable to complete yesterday).  -Continue ASA, hydral/isordil, and metoprolol tartrate. Unable to tolerate ACEI/ARB  2/2 CKD.   2. CKD/AKI- creatinine trending down, 1.61 today (1.63). Monitor daily.   3. CAD- Troponin elevation is a nonspecific pattern. EF decreased with WMA. On for stress test for today - day 2 of 2. Agree that ischemic evaluation is warranted - she is s/p CABG & is pending upcoming plastic surgery on CABG scar.  4. Tobacco abuse/Cocaine abuse- Continues to smoke. Reports that she stopped using cocaine 5 months ago. Counseled on cessation and the extreme risks associated with concurrent beta blockade.   5. HTN- Systolic BPs running [id number], likely needs uptitration of hydralazine after stress test. No ACEI 2/2 renal dysfunction.   6. Morbid obesity- Needs nutrition counseling for obesity. Body mass index is 41.59 kg/(m^2).  Signed, Melina Copa PA-C Pager: [phone]   I have seen and evaluated the patient this afternoon after. She feels much better today having had a bowel movement no longer complaining about constipation. She has not had any further chest tightness or pressure or dyspnea. She is lying flat without difficulties. Mildly hypertensive today. Can reassess as an outpatient because her blood pressure looks actually better over the last few hours. She is on an ACE inhibitor but is on hydralazine and nitrate. I think with her renal function being more stable could consider switching his back over to an ACE inhibitor or ARB at  outpatient evaluation.  She had her stress test secondary today with results pending. Provided there is no evidence ischemia she should be ready for discharge today. If positive would need to discuss possible catheterization with the patient.   Leonie Man, M.D., M.S. Interventional Cardiologist   Pager # [phone]

## 2015-05-18 ENCOUNTER — Encounter (HOSPITAL_COMMUNITY): Payer: Self-pay | Admitting: Cardiovascular Disease

## 2015-05-18 MED FILL — Lidocaine HCl Local Preservative Free (PF) Inj 1%: INTRAMUSCULAR | Qty: 30 | Status: AC

## 2015-05-18 MED FILL — Heparin Sodium (Porcine) 2 Unit/ML in Sodium Chloride 0.9%: INTRAMUSCULAR | Qty: 1000 | Status: AC

## 2015-06-05 ENCOUNTER — Encounter (HOSPITAL_COMMUNITY): Payer: Self-pay | Admitting: Physical Medicine and Rehabilitation

## 2015-06-05 ENCOUNTER — Emergency Department (HOSPITAL_COMMUNITY)
Admission: EM | Admit: 2015-06-05 | Discharge: 2015-06-05 | Disposition: A | Discharge status: Home / Self Care | Payer: Medicaid Other | Attending: Emergency Medicine | Admitting: Emergency Medicine

## 2015-06-05 DIAGNOSIS — Z720 Tobacco use: Secondary | ICD-10-CM | POA: Diagnosis not present

## 2015-06-05 DIAGNOSIS — Z7951 Long term (current) use of inhaled steroids: Secondary | ICD-10-CM | POA: Diagnosis not present

## 2015-06-05 DIAGNOSIS — Z7982 Long term (current) use of aspirin: Secondary | ICD-10-CM | POA: Diagnosis not present

## 2015-06-05 DIAGNOSIS — Z8673 Personal history of transient ischemic attack (TIA), and cerebral infarction without residual deficits: Secondary | ICD-10-CM | POA: Diagnosis not present

## 2015-06-05 DIAGNOSIS — F1420 Cocaine dependence, uncomplicated: Secondary | ICD-10-CM | POA: Insufficient documentation

## 2015-06-05 DIAGNOSIS — Z79899 Other long term (current) drug therapy: Secondary | ICD-10-CM | POA: Diagnosis not present

## 2015-06-05 DIAGNOSIS — I2510 Atherosclerotic heart disease of native coronary artery without angina pectoris: Secondary | ICD-10-CM | POA: Insufficient documentation

## 2015-06-05 DIAGNOSIS — H409 Unspecified glaucoma: Secondary | ICD-10-CM | POA: Insufficient documentation

## 2015-06-05 DIAGNOSIS — E119 Type 2 diabetes mellitus without complications: Secondary | ICD-10-CM | POA: Diagnosis not present

## 2015-06-05 DIAGNOSIS — I10 Essential (primary) hypertension: Secondary | ICD-10-CM | POA: Insufficient documentation

## 2015-06-05 DIAGNOSIS — Z794 Long term (current) use of insulin: Secondary | ICD-10-CM | POA: Diagnosis not present

## 2015-06-05 DIAGNOSIS — Z951 Presence of aortocoronary bypass graft: Secondary | ICD-10-CM | POA: Diagnosis not present

## 2015-06-05 DIAGNOSIS — I252 Old myocardial infarction: Secondary | ICD-10-CM | POA: Insufficient documentation

## 2015-06-05 DIAGNOSIS — M1990 Unspecified osteoarthritis, unspecified site: Secondary | ICD-10-CM | POA: Diagnosis not present

## 2015-06-05 DIAGNOSIS — Z9889 Other specified postprocedural states: Secondary | ICD-10-CM | POA: Diagnosis not present

## 2015-06-05 DIAGNOSIS — F1410 Cocaine abuse, uncomplicated: Secondary | ICD-10-CM | POA: Diagnosis present

## 2015-06-05 LAB — CBC
HEMATOCRIT: 38.1 % (ref 36.0–46.0)
HEMOGLOBIN: 12.1 g/dL (ref 12.0–15.0)
MCH: 24.2 pg — AB (ref 26.0–34.0)
MCHC: 31.8 g/dL (ref 30.0–36.0)
MCV: 76 fL — ABNORMAL LOW (ref 78.0–100.0)
Platelets: 332 10*3/uL (ref 150–400)
RBC: 5.01 MIL/uL (ref 3.87–5.11)
RDW: 19 % — AB (ref 11.5–15.5)
WBC: 6.8 10*3/uL (ref 4.0–10.5)

## 2015-06-05 LAB — COMPREHENSIVE METABOLIC PANEL
ALT: 18 U/L (ref 14–54)
AST: 21 U/L (ref 15–41)
Albumin: 3.5 g/dL (ref 3.5–5.0)
Alkaline Phosphatase: 104 U/L (ref 38–126)
Anion gap: 8 (ref 5–15)
BILIRUBIN TOTAL: 0.4 mg/dL (ref 0.3–1.2)
BUN: 30 mg/dL — ABNORMAL HIGH (ref 6–20)
CO2: 22 mmol/L (ref 22–32)
CREATININE: 1.82 mg/dL — AB (ref 0.44–1.00)
Calcium: 9.3 mg/dL (ref 8.9–10.3)
Chloride: 110 mmol/L (ref 101–111)
GFR calc Af Amer: 33 mL/min — ABNORMAL LOW (ref >60)
GFR, EST NON AFRICAN AMERICAN: 28 mL/min — AB (ref >60)
Glucose, Bld: 96 mg/dL (ref 65–99)
Potassium: 4.1 mmol/L (ref 3.5–5.1)
Sodium: 140 mmol/L (ref 135–145)
Total Protein: 7.6 g/dL (ref 6.5–8.1)

## 2015-06-05 LAB — RAPID URINE DRUG SCREEN, HOSP PERFORMED
Amphetamines: NOT DETECTED
Barbiturates: NOT DETECTED
Benzodiazepines: NOT DETECTED
Cocaine: POSITIVE — AB
Opiates: NOT DETECTED
Tetrahydrocannabinol: NOT DETECTED

## 2015-06-05 LAB — ETHANOL: Alcohol, Ethyl (B): <5 mg/dL (ref <5)

## 2015-06-05 LAB — ACETAMINOPHEN LEVEL

## 2015-06-05 LAB — SALICYLATE LEVEL: Salicylate Lvl: <4 mg/dL (ref 2.8–30.0)

## 2015-06-05 NOTE — ED Provider Notes (Signed)
CSN: [id number]     Arrival date & time 06/05/15  1444 History  This chart was scribed for non-physician practitioner, [facility] M. Janit Bern, NP working with Evelina Bucy, MD by Hansel Feinstein, ED scribe. This patient was seen in room TR07C/TR07C and the patient's care was started at 4:15 PM    Chief Complaint  Patient presents with  . Drug Problem   Patient is a 64 y.o. female presenting with drug problem. The history is provided by the patient. No language interpreter was used.  Drug Problem This is a chronic problem. The current episode started more than 1 week ago. Episode frequency: Last use May 9. The problem has not changed since onset.Pertinent negatives include no chest pain and no shortness of breath. Nothing aggravates the symptoms. Nothing relieves the symptoms. She has tried nothing for the symptoms.    HPI Comments: [name] [name] is a 64 y.o. female with Hx of HTN, HLD, CAD, MI, DM, cocaine use, and triple bypass surgery who presents to the Emergency Department requesting detox assistance in order to enter a rehab program in [facility]. Pt spoke with [facility] recently about the issue and they stated that she had to be detoxed before she can be assessed by them. She states that she uses crack/cocaine (last use May 9), alcohol, and nicotine. She was seen in the ED on May 14 for SOB and was admitted for 6 days and was placed on insulin. She denies diabetic coma. Pt reports associated chronic left leg pain for 3 years due to her rheumatoid arthritis. Pt denies use of any other substances. She also denies SOB and CP.   Past Medical History  Diagnosis Date  . Hypertension   . Diabetes mellitus     diagnosed in 2008  . Glaucoma   . Hyperlipidemia   . CVA (cerebral infarction)     right internal capsule stroke in 12/2006  . Left-sided sensory deficit present   . Cocaine abuse     crack cocaine heavily until 2008 then sporatic use since then  . Tobacco abuse   . Thyroid nodule      FNA in [id number] showed follicular cells but not definate neoplasm  . Coronary artery disease   . Myocardial infarction   . 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 Coronary artery bypass graft  07/09/2012    Procedure: CORONARY ARTERY BYPASS GRAFTING (CABG);  Surgeon: Ivin Poot, MD;  Location: [place];  Service: Open Heart Surgery;  Laterality: N/A;  . Cardiac catheterization    . Sternal wound debridement  08/17/2012    Procedure: STERNAL WOUND DEBRIDEMENT;  Surgeon: Ivin Poot, MD;  Location: [place];  Service: Thoracic;  Laterality: N/A;  wound vac application  . Sternal wound debridement  08/24/2012    Procedure: STERNAL WOUND DEBRIDEMENT;  Surgeon: Ivin Poot, MD;  Location: [place];  Service: Thoracic;  Laterality: N/A;  . Sternal wound debridement  09/01/2012    Procedure: STERNAL WOUND DEBRIDEMENT;  Surgeon: Ivin Poot, MD;  Location: [place];  Service: Thoracic;  Laterality: N/A;  . Sternal wound debridement  09/20/2012    Procedure: STERNAL WOUND DEBRIDEMENT;  Surgeon: Ivin Poot, MD;  Location: [facility];  Service: Thoracic;  Laterality: N/A;  wound vac change  . Left heart catheterization with coronary angiogram N/A 06/29/2012    Procedure: LEFT HEART CATHETERIZATION WITH CORONARY ANGIOGRAM;  Surgeon: Peter M Martinique,  MD;  Location: [place] CATH LAB;  Service: Cardiovascular;  Laterality: N/A;  . Cardiac catheterization N/A 05/17/2015    Procedure: Left Heart Cath and Cors/Grafts Angiography;  Surgeon: [name], MD;  Location: [place] CV LAB;  Service: Cardiovascular;  Laterality: N/A;   Family History  Problem Relation Age of Onset  . Other      no known family CAD  . Diabetes Mother    History  Substance Use Topics  . Smoking status: Current Every Day Smoker -- 0.25 packs/day  . Smokeless tobacco: Not on file  . Alcohol Use: No   OB History    No data available     Review of Systems  Respiratory:  Negative for shortness of breath.   Cardiovascular: Negative for chest pain.  Musculoskeletal: Positive for arthralgias (chronic).  All other systems reviewed and are negative.  Allergies  Naproxen  Home Medications   Prior to Admission medications   Medication Sig Start Date End Date Taking? Authorizing Provider  aspirin 325 MG EC tablet Take 325 mg by mouth daily.    Historical Provider, MD  brinzolamide (AZOPT) 1 % ophthalmic suspension Place 1 drop into both eyes 3 (three) times daily.    Historical Provider, MD  cetirizine (ZYRTEC) 10 MG tablet Take 10 mg by mouth daily as needed.     Historical Provider, MD  Cyanocobalamin (VITAMIN B-12 PO) Take 1 tablet by mouth daily.    Historical Provider, MD  Fluticasone-Salmeterol (ADVAIR) 250-50 MCG/DOSE AEPB Inhale 1 puff into the lungs every 12 (twelve) hours.    Historical Provider, MD  furosemide (LASIX) 40 MG tablet Take 1 tablet (40 mg total) by mouth daily. Take additional dose if you experience weight gain. 05/17/15   [name], DO  gabapentin (NEURONTIN) 100 MG capsule Take 300 mg by mouth at bedtime.     Historical Provider, MD  glimepiride (AMARYL) 4 MG tablet Take 4 mg by mouth daily before breakfast.    Historical Provider, MD  hydrALAZINE (APRESOLINE) 50 MG tablet Take 1 tablet (50 mg total) by mouth every 8 (eight) hours. 05/15/15   [name], MD  Insulin Glargine (LANTUS SOLOSTAR) 100 UNIT/ML Solostar Pen Inject 25 Units into the skin daily at 10 pm. 05/16/15   [name], DO  isosorbide mononitrate (IMDUR) 30 MG 24 hr tablet Take 1 tablet (30 mg total) by mouth daily. 05/15/15   [name], MD  latanoprost (XALATAN) 0.005 % ophthalmic solution Place 1 drop into both eyes at bedtime.    Historical Provider, MD  metoprolol tartrate (LOPRESSOR) 25 MG tablet Take 1 tablet (25 mg total) by mouth 2 (two) times daily. 08/22/12   [name], PA-C  pravastatin (PRAVACHOL) 40 MG tablet Take 40 mg by mouth at bedtime.     Historical Provider, MD  traMADol (ULTRAM) 50 MG tablet Take 50 mg by mouth 3 (three) times daily.     Historical Provider, MD   BP 109/64 mmHg  Pulse 70  Temp(Src) 98.7 F (37.1 C) (Oral)  Resp 18  SpO2 100% Physical Exam  Constitutional: She is oriented to person, place, and time. She appears well-developed and well-nourished. No distress.  HENT:  Head: Normocephalic and atraumatic.  Mouth/Throat: Oropharynx is clear and moist.  Eyes: Conjunctivae and EOM are normal. Pupils are equal, round, and reactive to light.  Neck: Normal range of motion. Neck supple. No tracheal deviation present.  Cardiovascular: Normal rate and regular rhythm.   Pulmonary/Chest: Effort normal and breath sounds  normal.  Abdominal: Soft. There is no tenderness.  Musculoskeletal: Normal range of motion. She exhibits no edema or tenderness.  Neurological: She is alert and oriented to person, place, and time.  Skin: Skin is warm and dry.  Psychiatric: She has a normal mood and affect. Her behavior is normal.  Nursing note and vitals reviewed.   ED Course  Procedures (including critical care time) DIAGNOSTIC STUDIES: Oxygen Saturation is 99% on RA, normal by my interpretation.    COORDINATION OF CARE: 4:24 PM Discussed treatment plan with pt at bedside and pt agreed to plan.   Labs Review Results for orders placed or performed during the hospital encounter of 06/05/15 (from the past 24 hour(s))  Acetaminophen level     Status: Abnormal   Collection Time: 06/05/15  4:02 PM  Result Value Ref Range   Acetaminophen (Tylenol), Serum <10 (L) 10 - 30 ug/mL  CBC     Status: Abnormal   Collection Time: 06/05/15  4:02 PM  Result Value Ref Range   WBC 6.8 4.0 - 10.5 K/uL   RBC 5.01 3.87 - 5.11 MIL/uL   Hemoglobin 12.1 12.0 - 15.0 g/dL   HCT 38.1 36.0 - 46.0 %   MCV 76.0 (L) 78.0 - 100.0 fL   MCH 24.2 (L) 26.0 - 34.0 pg   MCHC 31.8 30.0 - 36.0 g/dL   RDW 19.0 (H) 11.5 - 15.5 %   Platelets 332 150 - 400  K/uL  Comprehensive metabolic panel     Status: Abnormal   Collection Time: 06/05/15  4:02 PM  Result Value Ref Range   Sodium 140 135 - 145 mmol/L   Potassium 4.1 3.5 - 5.1 mmol/L   Chloride 110 101 - 111 mmol/L   CO2 22 22 - 32 mmol/L   Glucose, Bld 96 65 - 99 mg/dL   BUN 30 (H) 6 - 20 mg/dL   Creatinine, Ser 1.82 (H) 0.44 - 1.00 mg/dL   Calcium 9.3 8.9 - 10.3 mg/dL   Total Protein 7.6 6.5 - 8.1 g/dL   Albumin 3.5 3.5 - 5.0 g/dL   AST 21 15 - 41 U/L   ALT 18 14 - 54 U/L   Alkaline Phosphatase 104 38 - 126 U/L   Total Bilirubin 0.4 0.3 - 1.2 mg/dL   GFR calc non Af Amer 28 (L) >60 mL/min   GFR calc Af Amer 33 (L) >60 mL/min   Anion gap 8 5 - 15  Ethanol (ETOH)     Status: None   Collection Time: 06/05/15  4:02 PM  Result Value Ref Range   Alcohol, Ethyl (B) <5 <5 mg/dL  Salicylate level     Status: None   Collection Time: 06/05/15  4:02 PM  Result Value Ref Range   Salicylate Lvl 123456 2.8 - 30.0 mg/dL  Urine rapid drug screen (hosp performed)not at [facility]     Status: Abnormal   Collection Time: 06/05/15  4:02 PM  Result Value Ref Range   Opiates NONE DETECTED NONE DETECTED   Cocaine POSITIVE (A) NONE DETECTED   Benzodiazepines NONE DETECTED NONE DETECTED   Amphetamines NONE DETECTED NONE DETECTED   Tetrahydrocannabinol NONE DETECTED NONE DETECTED   Barbiturates NONE DETECTED NONE DETECTED     MDM  64 y.o. female here for help with cocaine addiction. Although she denies using cocaine since 05/07/15 her drug screen is positive.  Dr. Mingo Amber in to see the patient and discuss plan of care. Patient is to see her PCP for  referral to rehab facility. Medical screening done here today. Discussed with the patient and her family plan of care and all questioned fully answered. Patient voices understanding and agrees with plan.  Final diagnoses:  Cocaine dependence without complication   I personally performed the services described in this documentation, which was scribed in my  presence. The recorded information has been reviewed and is accurate.   [address] [place], NP 06/06/15 0028  Evelina Bucy, MD 06/06/15 [phone]

## 2015-06-05 NOTE — ED Notes (Signed)
Pt states the last time she used Crack cocaine was May 9th.

## 2015-06-05 NOTE — ED Notes (Signed)
Pt presents to department for evaluation of detox from crack cocaine. Last use on 05/07/2015. States "I want to get help." pt is alert and oriented x4.

## 2015-06-05 NOTE — Discharge Instructions (Signed)
Results for orders placed or performed during the hospital encounter of 06/05/15 (from the past 24 hour(s))  Acetaminophen level     Status: Abnormal   Collection Time: 06/05/15  4:02 PM  Result Value Ref Range   Acetaminophen (Tylenol), Serum <10 (L) 10 - 30 ug/mL  CBC     Status: Abnormal   Collection Time: 06/05/15  4:02 PM  Result Value Ref Range   WBC 6.8 4.0 - 10.5 K/uL   RBC 5.01 3.87 - 5.11 MIL/uL   Hemoglobin 12.1 12.0 - 15.0 g/dL   HCT 38.1 36.0 - 46.0 %   MCV 76.0 (L) 78.0 - 100.0 fL   MCH 24.2 (L) 26.0 - 34.0 pg   MCHC 31.8 30.0 - 36.0 g/dL   RDW 19.0 (H) 11.5 - 15.5 %   Platelets 332 150 - 400 K/uL  Comprehensive metabolic panel     Status: Abnormal   Collection Time: 06/05/15  4:02 PM  Result Value Ref Range   Sodium 140 135 - 145 mmol/L   Potassium 4.1 3.5 - 5.1 mmol/L   Chloride 110 101 - 111 mmol/L   CO2 22 22 - 32 mmol/L   Glucose, Bld 96 65 - 99 mg/dL   BUN 30 (H) 6 - 20 mg/dL   Creatinine, Ser 1.82 (H) 0.44 - 1.00 mg/dL   Calcium 9.3 8.9 - 10.3 mg/dL   Total Protein 7.6 6.5 - 8.1 g/dL   Albumin 3.5 3.5 - 5.0 g/dL   AST 21 15 - 41 U/L   ALT 18 14 - 54 U/L   Alkaline Phosphatase 104 38 - 126 U/L   Total Bilirubin 0.4 0.3 - 1.2 mg/dL   GFR calc non Af Amer 28 (L) >60 mL/min   GFR calc Af Amer 33 (L) >60 mL/min   Anion gap 8 5 - 15  Ethanol (ETOH)     Status: None   Collection Time: 06/05/15  4:02 PM  Result Value Ref Range   Alcohol, Ethyl (B) <5 <5 mg/dL  Salicylate level     Status: None   Collection Time: 06/05/15  4:02 PM  Result Value Ref Range   Salicylate Lvl 123456 2.8 - 30.0 mg/dL  Urine rapid drug screen (hosp performed)not at [facility]     Status: Abnormal   Collection Time: 06/05/15  4:02 PM  Result Value Ref Range   Opiates NONE DETECTED NONE DETECTED   Cocaine POSITIVE (A) NONE DETECTED   Benzodiazepines NONE DETECTED NONE DETECTED   Amphetamines NONE DETECTED NONE DETECTED   Tetrahydrocannabinol NONE DETECTED NONE DETECTED   Barbiturates  NONE DETECTED NONE DETECTED

## 2015-06-05 NOTE — ED Notes (Signed)
Dr. Mingo Amber at the bedside with pt.

## 2015-06-14 ENCOUNTER — Encounter: Payer: Self-pay | Admitting: Nurse Practitioner

## 2015-06-14 ENCOUNTER — Ambulatory Visit (INDEPENDENT_AMBULATORY_CARE_PROVIDER_SITE_OTHER): Payer: Medicaid Other | Admitting: Nurse Practitioner

## 2015-06-14 VITALS — BP 98/60 | HR 71 | Ht 68.5 in | Wt 261.0 lb

## 2015-06-14 DIAGNOSIS — I2510 Atherosclerotic heart disease of native coronary artery without angina pectoris: Secondary | ICD-10-CM

## 2015-06-14 DIAGNOSIS — Z720 Tobacco use: Secondary | ICD-10-CM

## 2015-06-14 DIAGNOSIS — I5042 Chronic combined systolic (congestive) and diastolic (congestive) heart failure: Secondary | ICD-10-CM | POA: Diagnosis not present

## 2015-06-14 DIAGNOSIS — N186 End stage renal disease: Secondary | ICD-10-CM | POA: Insufficient documentation

## 2015-06-14 DIAGNOSIS — I10 Essential (primary) hypertension: Secondary | ICD-10-CM | POA: Diagnosis not present

## 2015-06-14 DIAGNOSIS — N184 Chronic kidney disease, stage 4 (severe): Secondary | ICD-10-CM

## 2015-06-14 DIAGNOSIS — F1410 Cocaine abuse, uncomplicated: Secondary | ICD-10-CM

## 2015-06-14 DIAGNOSIS — I255 Ischemic cardiomyopathy: Secondary | ICD-10-CM | POA: Diagnosis not present

## 2015-06-14 DIAGNOSIS — N183 Chronic kidney disease, stage 3 unspecified: Secondary | ICD-10-CM

## 2015-06-14 NOTE — Patient Instructions (Addendum)
Medication Instructions:    STOP ASPIRIN 325 MG   START TAKING ASPIRIN 81 MG   Labwork:   Testing/Procedures:   Follow-Up:  Your physician wants you to follow-up in:  IN 6 MONTHS WITH DR Martinique  You will receive a reminder letter in the mail two months in advance. If you don't receive a letter, please call our office to schedule the follow-up appointment.   Any Other Special Instructions Will Be Listed Below (If Applicable).

## 2015-06-14 NOTE — Progress Notes (Signed)
Patient Name: [name] [name] Date of Encounter: 06/14/2015  Primary Care Provider:  Kevan Ny, MD Primary Cardiologist:  P. Martinique, MD   Chief Complaint  64 y/o female with a h/o CAD s/p CABG along with polysubstance abuse, who presents today for f/u after recent chf hospitalization.  Past Medical History   Past Medical History  Diagnosis Date  . Essential hypertension   . Diabetes mellitus     diagnosed in 2008  . Glaucoma   . Hyperlipidemia   . CVA (cerebral infarction)     a. right internal capsule stroke in 12/2006  . Left-sided sensory deficit present   . Cocaine abuse     crack cocaine heavily until 2008 then sporadic use since then  . Tobacco abuse   . Thyroid nodule     FNA in [id number] showed follicular cells but not definate neoplasm  . Coronary artery disease     a. 06/2012 NSTEMI/CABG x 3 (LIMA->LAD, VG->OM2, VG->LCX);  b. 04/2015 MV: EF<30%, mid ant, apicalanterior, apical infarct;  c. 04/2015 Cath: LM nl, LAD 90p, LCX 19m, OM1 min irregs, RCA mild dzs, LIMA->LAD nl w/ dist LAD dzs, VG->OM2 nl, VG->LCX nl-->Med Rx.  . Arthritis of left shoulder region 03/23/2013  . Chronic combined systolic and diastolic CHF (congestive heart failure)     a. EF 40-45%, mild LVH, mid apicalanteroseptal and apical HK.  Marland Kitchen PVD (peripheral vascular disease)     a. 06/2012 ABI's: R - 0.73, L - 0.71.  Marland Kitchen CKD (chronic kidney disease), stage III    Past Surgical History  Procedure Laterality Date  . Coronary artery bypass graft  07/09/2012    Procedure: CORONARY ARTERY BYPASS GRAFTING (CABG);  Surgeon: Ivin Poot, MD;  Location: [place];  Service: Open Heart Surgery;  Laterality: N/A;  . Cardiac catheterization    . Sternal wound debridement  08/17/2012    Procedure: STERNAL WOUND DEBRIDEMENT;  Surgeon: Ivin Poot, MD;  Location: [place];  Service: Thoracic;  Laterality: N/A;  wound vac application  . Sternal wound debridement  08/24/2012    Procedure: STERNAL WOUND DEBRIDEMENT;  Surgeon:  Ivin Poot, MD;  Location: [place];  Service: Thoracic;  Laterality: N/A;  . Sternal wound debridement  09/01/2012    Procedure: STERNAL WOUND DEBRIDEMENT;  Surgeon: Ivin Poot, MD;  Location: [place];  Service: Thoracic;  Laterality: N/A;  . Sternal wound debridement  09/20/2012    Procedure: STERNAL WOUND DEBRIDEMENT;  Surgeon: Ivin Poot, MD;  Location: [facility] OR;  Service: Thoracic;  Laterality: N/A;  wound vac change  . Left heart catheterization with coronary angiogram N/A 06/29/2012    Procedure: LEFT HEART CATHETERIZATION WITH CORONARY ANGIOGRAM;  Surgeon: Peter M Martinique, MD;  Location: [facility] CATH LAB;  Service: Cardiovascular;  Laterality: N/A;  . Cardiac catheterization N/A 05/17/2015    Procedure: Left Heart Cath and Cors/Grafts Angiography;  Surgeon: [name], MD;  Location: [place] CV LAB;  Service: Cardiovascular;  Laterality: N/A;   Allergies  Allergies  Allergen Reactions  . Naproxen     rash    HPI  64 y/o female with the above complex problem list.  She is s/p CABG x 3 in 06/2012 with post-op course complicated by sternal wound infxn requiring several debridements.  She also has a h/o tobacco and crack cocaine abuse and chronic combined CHF.  In mid-May, she was admitted to [facility] with dyspnea and volume overload.  Troponin was mildly elevated with a flat  trend.  Echo showed EF of 40-45%.  Following diuresis, stress testing was undertaken and abnl with low EF and several areas of infarct.  Cath was performed and revealed native LAD dzs with 3/3 patent grafts.  Med Rx was recommended.  Since then, she has done well.  She has worked on changing her diet and says that she has lost 14 lbs.  She has been walking twice a day w/o chest pain or dyspnea.  She denies palpitations, pnd, orthopnea, n, v, dizziness, syncope, edema, weight gain, or early satiety.  She has cut salt from her diet and has not used crack in 39 days.  She continues to smoke about a 1/2 ppd.  Home  Medications  Prior to Admission medications   Medication Sig Start Date End Date Taking? Authorizing Provider  aspirin EC 81 MG tablet Take 81 mg by mouth daily.   Yes Historical Provider, MD  brinzolamide (AZOPT) 1 % ophthalmic suspension Place 1 drop into both eyes 3 (three) times daily.   Yes Historical Provider, MD  cetirizine (ZYRTEC) 10 MG tablet Take 10 mg by mouth daily as needed.    Yes Historical Provider, MD  Fluticasone-Salmeterol (ADVAIR) 250-50 MCG/DOSE AEPB Inhale 1 puff into the lungs every 12 (twelve) hours.   Yes Historical Provider, MD  furosemide (LASIX) 40 MG tablet Take 1 tablet (40 mg total) by mouth daily. Take additional dose if you experience weight gain. 05/17/15  Yes [name], DO  gabapentin (NEURONTIN) 100 MG capsule Take 300 mg by mouth at bedtime.    Yes Historical Provider, MD  glimepiride (AMARYL) 4 MG tablet Take 4 mg by mouth daily before breakfast.   Yes Historical Provider, MD  hydrALAZINE (APRESOLINE) 50 MG tablet Take 1 tablet (50 mg total) by mouth every 8 (eight) hours. 05/15/15  Yes [name], MD  Insulin Glargine (LANTUS SOLOSTAR) 100 UNIT/ML Solostar Pen Inject 25 Units into the skin daily at 10 pm. 05/16/15  Yes [name], DO  isosorbide mononitrate (IMDUR) 30 MG 24 hr tablet Take 1 tablet (30 mg total) by mouth daily. 05/15/15  Yes [name], MD  latanoprost (XALATAN) 0.005 % ophthalmic solution Place 1 drop into both eyes at bedtime.   Yes Historical Provider, MD  metFORMIN (GLUCOPHAGE) 1000 MG tablet Take 1,000 mg by mouth 2 (two) times daily with a meal.   Yes Historical Provider, MD  metoprolol tartrate (LOPRESSOR) 25 MG tablet Take 1 tablet (25 mg total) by mouth 2 (two) times daily. 08/22/12  Yes [name], PA-C  pravastatin (PRAVACHOL) 40 MG tablet Take 40 mg by mouth at bedtime.   Yes Historical Provider, MD  traMADol (ULTRAM) 50 MG tablet Take 50 mg by mouth 3 (three) times daily.    Yes Historical Provider, MD    Review of  Systems  She denies chest pain, palpitations, dyspnea, pnd, orthopnea, n, v, dizziness, syncope, edema, weight gain, or early satiety.  All other systems reviewed and are otherwise negative except as noted above.  Physical Exam  VS:  BP 98/60 mmHg  Pulse 71  Ht 5' 8.5" (1.74 m)  Wt 261 lb (118.389 kg)  BMI 39.10 kg/m2 , BMI Body mass index is 39.1 kg/(m^2). GEN: Well nourished, well developed, in no acute distress. HEENT: normal. Neck: Supple, no JVD, carotid bruits, or masses. Cardiac: RRR, no murmurs, rubs, or gallops. No clubbing, cyanosis, edema.  Radials/DP/PT 2+ and equal bilaterally.  Respiratory:  Respirations regular and unlabored, clear to auscultation bilaterally. GI:  Soft, nontender, nondistended, BS + x 4. MS: no deformity or atrophy. Skin: warm and dry, no rash. Neuro:  Strength and sensation are intact. Psych: Normal affect.  Accessory Clinical Findings  ECG -RSR, 71, LAE, lat twi - old.  Assessment & Plan  1.  CAD:  S/p prior CABG with 3/3 patent grafts upon recent cath.  No c/p or dyspnea since d/c.  She has gotten serious about losing weight and is now exercising/walking twice daily.  She has not used crack in 39 days and seems very encouraged and motivated.  Cont asa (reduce to 81 mg daily) bb, nitrate, and statin therapy.  2.  Crack Cocaine Abuse:  She quit 39 days ago.  Continued avoidance encouraged.  She did go to [facility] on 6/9 b/c she thought that was what she needed to do to be enrolled in a rehab program, but she was referred to outpt rehab programs instead.  3.  Chronic combined and diastolic CHF/ICM:  Volume stable on exam today.  HR/BP stable.  She is now avoiding salt in her diet.  She remains on bb, hydral/nitrate, and lasix.  She is not on ACEI/ARB/ARNI, presumably 2/2 h/o tenuous renal fxn.  We discussed the importance of daily weights, sodium restriction, medication compliance, and symptom reporting and she verbalizes understanding.  4.   Essential HTN:  Stable.  5.  HL:  Cont statin therapy.  PCP following lipids.  6.  Morbid Obesity:  She is trying to lose weight and is now walking twice daily.  7.  Tob Abuse:  Complete cessation advised.  8.  CKD III:  BMET drawn @ [facility] on 6/9 was stable.  9.  Dispo:  F/U with Dr. Martinique in 3 months or sooner if necessary.  Murray Hodgkins, NP 06/14/2015, 4:47 PM

## 2016-01-11 ENCOUNTER — Inpatient Hospital Stay (HOSPITAL_COMMUNITY): Payer: Medicare Other

## 2016-01-11 ENCOUNTER — Inpatient Hospital Stay (HOSPITAL_COMMUNITY)
Admission: EM | Admit: 2016-01-11 | Discharge: 2016-01-16 | DRG: 291 | Disposition: A | Discharge status: Home Health | Payer: Medicare Other | Attending: Internal Medicine | Admitting: Internal Medicine

## 2016-01-11 ENCOUNTER — Emergency Department (HOSPITAL_COMMUNITY): Payer: Medicare Other

## 2016-01-11 ENCOUNTER — Encounter (HOSPITAL_COMMUNITY): Payer: Self-pay | Admitting: Vascular Surgery

## 2016-01-11 DIAGNOSIS — R0602 Shortness of breath: Secondary | ICD-10-CM | POA: Diagnosis not present

## 2016-01-11 DIAGNOSIS — I5043 Acute on chronic combined systolic (congestive) and diastolic (congestive) heart failure: Secondary | ICD-10-CM | POA: Diagnosis not present

## 2016-01-11 DIAGNOSIS — D509 Iron deficiency anemia, unspecified: Secondary | ICD-10-CM | POA: Diagnosis present

## 2016-01-11 DIAGNOSIS — I491 Atrial premature depolarization: Secondary | ICD-10-CM | POA: Diagnosis present

## 2016-01-11 DIAGNOSIS — Z7982 Long term (current) use of aspirin: Secondary | ICD-10-CM | POA: Diagnosis not present

## 2016-01-11 DIAGNOSIS — E1159 Type 2 diabetes mellitus with other circulatory complications: Secondary | ICD-10-CM

## 2016-01-11 DIAGNOSIS — R5381 Other malaise: Secondary | ICD-10-CM | POA: Diagnosis present

## 2016-01-11 DIAGNOSIS — Z951 Presence of aortocoronary bypass graft: Secondary | ICD-10-CM

## 2016-01-11 DIAGNOSIS — Z833 Family history of diabetes mellitus: Secondary | ICD-10-CM | POA: Diagnosis not present

## 2016-01-11 DIAGNOSIS — Z886 Allergy status to analgesic agent status: Secondary | ICD-10-CM

## 2016-01-11 DIAGNOSIS — R197 Diarrhea, unspecified: Secondary | ICD-10-CM | POA: Diagnosis present

## 2016-01-11 DIAGNOSIS — R079 Chest pain, unspecified: Secondary | ICD-10-CM | POA: Diagnosis not present

## 2016-01-11 DIAGNOSIS — R0682 Tachypnea, not elsewhere classified: Secondary | ICD-10-CM | POA: Diagnosis not present

## 2016-01-11 DIAGNOSIS — F1410 Cocaine abuse, uncomplicated: Secondary | ICD-10-CM | POA: Diagnosis not present

## 2016-01-11 DIAGNOSIS — E875 Hyperkalemia: Secondary | ICD-10-CM | POA: Diagnosis not present

## 2016-01-11 DIAGNOSIS — Z992 Dependence on renal dialysis: Secondary | ICD-10-CM | POA: Diagnosis present

## 2016-01-11 DIAGNOSIS — N184 Chronic kidney disease, stage 4 (severe): Secondary | ICD-10-CM | POA: Diagnosis present

## 2016-01-11 DIAGNOSIS — E1151 Type 2 diabetes mellitus with diabetic peripheral angiopathy without gangrene: Secondary | ICD-10-CM | POA: Diagnosis present

## 2016-01-11 DIAGNOSIS — E1129 Type 2 diabetes mellitus with other diabetic kidney complication: Secondary | ICD-10-CM

## 2016-01-11 DIAGNOSIS — N186 End stage renal disease: Secondary | ICD-10-CM | POA: Diagnosis present

## 2016-01-11 DIAGNOSIS — Z8673 Personal history of transient ischemic attack (TIA), and cerebral infarction without residual deficits: Secondary | ICD-10-CM | POA: Diagnosis not present

## 2016-01-11 DIAGNOSIS — I5021 Acute systolic (congestive) heart failure: Secondary | ICD-10-CM

## 2016-01-11 DIAGNOSIS — R778 Other specified abnormalities of plasma proteins: Secondary | ICD-10-CM | POA: Diagnosis not present

## 2016-01-11 DIAGNOSIS — I252 Old myocardial infarction: Secondary | ICD-10-CM

## 2016-01-11 DIAGNOSIS — E876 Hypokalemia: Secondary | ICD-10-CM | POA: Diagnosis present

## 2016-01-11 DIAGNOSIS — I130 Hypertensive heart and chronic kidney disease with heart failure and stage 1 through stage 4 chronic kidney disease, or unspecified chronic kidney disease: Secondary | ICD-10-CM | POA: Diagnosis present

## 2016-01-11 DIAGNOSIS — F1411 Cocaine abuse, in remission: Secondary | ICD-10-CM | POA: Diagnosis present

## 2016-01-11 DIAGNOSIS — E785 Hyperlipidemia, unspecified: Secondary | ICD-10-CM | POA: Diagnosis present

## 2016-01-11 DIAGNOSIS — Z794 Long term (current) use of insulin: Secondary | ICD-10-CM | POA: Diagnosis not present

## 2016-01-11 DIAGNOSIS — Z6841 Body Mass Index (BMI) 40.0 and over, adult: Secondary | ICD-10-CM | POA: Diagnosis not present

## 2016-01-11 DIAGNOSIS — F17200 Nicotine dependence, unspecified, uncomplicated: Secondary | ICD-10-CM | POA: Diagnosis not present

## 2016-01-11 DIAGNOSIS — I272 Other secondary pulmonary hypertension: Secondary | ICD-10-CM | POA: Diagnosis present

## 2016-01-11 DIAGNOSIS — Z7951 Long term (current) use of inhaled steroids: Secondary | ICD-10-CM

## 2016-01-11 DIAGNOSIS — I82619 Acute embolism and thrombosis of superficial veins of unspecified upper extremity: Secondary | ICD-10-CM | POA: Diagnosis not present

## 2016-01-11 DIAGNOSIS — I248 Other forms of acute ischemic heart disease: Secondary | ICD-10-CM | POA: Diagnosis present

## 2016-01-11 DIAGNOSIS — Z8249 Family history of ischemic heart disease and other diseases of the circulatory system: Secondary | ICD-10-CM | POA: Diagnosis not present

## 2016-01-11 DIAGNOSIS — L0291 Cutaneous abscess, unspecified: Secondary | ICD-10-CM | POA: Diagnosis not present

## 2016-01-11 DIAGNOSIS — Z720 Tobacco use: Secondary | ICD-10-CM | POA: Diagnosis not present

## 2016-01-11 DIAGNOSIS — I2510 Atherosclerotic heart disease of native coronary artery without angina pectoris: Secondary | ICD-10-CM | POA: Diagnosis present

## 2016-01-11 DIAGNOSIS — I509 Heart failure, unspecified: Secondary | ICD-10-CM

## 2016-01-11 DIAGNOSIS — I270 Primary pulmonary hypertension: Secondary | ICD-10-CM | POA: Diagnosis not present

## 2016-01-11 DIAGNOSIS — I209 Angina pectoris, unspecified: Secondary | ICD-10-CM

## 2016-01-11 DIAGNOSIS — N183 Chronic kidney disease, stage 3 (moderate): Secondary | ICD-10-CM | POA: Diagnosis not present

## 2016-01-11 DIAGNOSIS — E1165 Type 2 diabetes mellitus with hyperglycemia: Secondary | ICD-10-CM | POA: Diagnosis present

## 2016-01-11 DIAGNOSIS — H409 Unspecified glaucoma: Secondary | ICD-10-CM | POA: Diagnosis present

## 2016-01-11 DIAGNOSIS — E1122 Type 2 diabetes mellitus with diabetic chronic kidney disease: Secondary | ICD-10-CM | POA: Diagnosis present

## 2016-01-11 DIAGNOSIS — N179 Acute kidney failure, unspecified: Secondary | ICD-10-CM | POA: Diagnosis not present

## 2016-01-11 DIAGNOSIS — R2232 Localized swelling, mass and lump, left upper limb: Secondary | ICD-10-CM | POA: Diagnosis not present

## 2016-01-11 DIAGNOSIS — N189 Chronic kidney disease, unspecified: Secondary | ICD-10-CM

## 2016-01-11 DIAGNOSIS — I10 Essential (primary) hypertension: Secondary | ICD-10-CM

## 2016-01-11 DIAGNOSIS — I110 Hypertensive heart disease with heart failure: Secondary | ICD-10-CM | POA: Diagnosis not present

## 2016-01-11 DIAGNOSIS — I5023 Acute on chronic systolic (congestive) heart failure: Secondary | ICD-10-CM | POA: Diagnosis not present

## 2016-01-11 DIAGNOSIS — R0603 Acute respiratory distress: Secondary | ICD-10-CM

## 2016-01-11 DIAGNOSIS — F1910 Other psychoactive substance abuse, uncomplicated: Secondary | ICD-10-CM | POA: Diagnosis present

## 2016-01-11 DIAGNOSIS — E1100 Type 2 diabetes mellitus with hyperosmolarity without nonketotic hyperglycemic-hyperosmolar coma (NKHHC): Secondary | ICD-10-CM

## 2016-01-11 LAB — COMPREHENSIVE METABOLIC PANEL WITH GFR
ALT: 9 U/L — ABNORMAL LOW (ref 14–54)
AST: 16 U/L (ref 15–41)
Albumin: 3.3 g/dL — ABNORMAL LOW (ref 3.5–5.0)
Alkaline Phosphatase: 111 U/L (ref 38–126)
Anion gap: 15 (ref 5–15)
BUN: 20 mg/dL (ref 6–20)
CO2: 22 mmol/L (ref 22–32)
Calcium: 9.2 mg/dL (ref 8.9–10.3)
Chloride: 107 mmol/L (ref 101–111)
Creatinine, Ser: 1.77 mg/dL — ABNORMAL HIGH (ref 0.44–1.00)
GFR calc Af Amer: 34 mL/min — ABNORMAL LOW
GFR calc non Af Amer: 29 mL/min — ABNORMAL LOW
Glucose, Bld: 296 mg/dL — ABNORMAL HIGH (ref 65–99)
Potassium: 3.2 mmol/L — ABNORMAL LOW (ref 3.5–5.1)
Sodium: 144 mmol/L (ref 135–145)
Total Bilirubin: 0.8 mg/dL (ref 0.3–1.2)
Total Protein: 7.8 g/dL (ref 6.5–8.1)

## 2016-01-11 LAB — CBC
HCT: 32.2 % — ABNORMAL LOW (ref 36.0–46.0)
HEMOGLOBIN: 9.3 g/dL — AB (ref 12.0–15.0)
MCH: 20.4 pg — ABNORMAL LOW (ref 26.0–34.0)
MCHC: 28.9 g/dL — ABNORMAL LOW (ref 30.0–36.0)
MCV: 70.8 fL — AB (ref 78.0–100.0)
PLATELETS: 462 10*3/uL — AB (ref 150–400)
RBC: 4.55 MIL/uL (ref 3.87–5.11)
RDW: 19.8 % — ABNORMAL HIGH (ref 11.5–15.5)
WBC: 12.4 10*3/uL — ABNORMAL HIGH (ref 4.0–10.5)

## 2016-01-11 LAB — LIPID PANEL
CHOLESTEROL: 120 mg/dL (ref 0–200)
HDL: 25 mg/dL — AB (ref >40)
LDL CALC: 80 mg/dL (ref 0–99)
TRIGLYCERIDES: 76 mg/dL (ref <150)
Total CHOL/HDL Ratio: 4.8 RATIO
VLDL: 15 mg/dL (ref 0–40)

## 2016-01-11 LAB — HEPARIN LEVEL (UNFRACTIONATED): HEPARIN UNFRACTIONATED: 0.21 [IU]/mL — AB (ref 0.30–0.70)

## 2016-01-11 LAB — CBG MONITORING, ED
GLUCOSE-CAPILLARY: 312 mg/dL — AB (ref 65–99)
Glucose-Capillary: 170 mg/dL — ABNORMAL HIGH (ref 65–99)

## 2016-01-11 LAB — GLUCOSE, CAPILLARY
GLUCOSE-CAPILLARY: 86 mg/dL (ref 65–99)
GLUCOSE-CAPILLARY: 187 mg/dL — AB (ref 65–99)
Glucose-Capillary: 234 mg/dL — ABNORMAL HIGH (ref 65–99)

## 2016-01-11 LAB — TROPONIN I
TROPONIN I: 0.35 ng/mL — AB (ref <0.031)
TROPONIN I: 1.02 ng/mL — AB (ref <0.031)
Troponin I: 2.35 ng/mL (ref <0.031)

## 2016-01-11 LAB — PROTIME-INR
INR: 1.19 (ref 0.00–1.49)
PROTHROMBIN TIME: 15.3 s — AB (ref 11.6–15.2)

## 2016-01-11 LAB — I-STAT TROPONIN, ED: TROPONIN I, POC: 0.03 ng/mL (ref 0.00–0.08)

## 2016-01-11 LAB — MRSA PCR SCREENING: MRSA BY PCR: NEGATIVE

## 2016-01-11 LAB — BRAIN NATRIURETIC PEPTIDE: B NATRIURETIC PEPTIDE 5: 1097.7 pg/mL — AB (ref 0.0–100.0)

## 2016-01-11 LAB — APTT: aPTT: 27 seconds (ref 24–37)

## 2016-01-11 MED ORDER — METOPROLOL TARTRATE 25 MG PO TABS
25.0000 mg | ORAL_TABLET | Freq: Two times a day (BID) | ORAL | Status: DC
  Administered 2016-01-11 – 2016-01-12 (×3): 25 mg via ORAL
  Filled 2016-01-11 (×3): qty 1

## 2016-01-11 MED ORDER — INFLUENZA VAC SPLIT QUAD 0.5 ML IM SUSY: 0.5000 mL | PREFILLED_SYRINGE | INTRAMUSCULAR | Status: DC | PRN

## 2016-01-11 MED ORDER — PERFLUTREN LIPID MICROSPHERE
1.0000 mL | INTRAVENOUS | Status: AC | PRN
  Administered 2016-01-11: 2 mL via INTRAVENOUS
  Filled 2016-01-11: qty 10

## 2016-01-11 MED ORDER — LATANOPROST 0.005 % OP SOLN
1.0000 [drp] | Freq: Every day | OPHTHALMIC | Status: DC
  Administered 2016-01-11 – 2016-01-15 (×5): 1 [drp] via OPHTHALMIC
  Filled 2016-01-11: qty 2.5

## 2016-01-11 MED ORDER — LORATADINE 10 MG PO TABS
10.0000 mg | ORAL_TABLET | Freq: Every day | ORAL | Status: DC
  Administered 2016-01-11 – 2016-01-16 (×6): 10 mg via ORAL
  Filled 2016-01-11 (×7): qty 1

## 2016-01-11 MED ORDER — TRAMADOL HCL 50 MG PO TABS
50.0000 mg | ORAL_TABLET | Freq: Three times a day (TID) | ORAL | Status: DC
  Administered 2016-01-11 – 2016-01-16 (×16): 50 mg via ORAL
  Filled 2016-01-11 (×17): qty 1

## 2016-01-11 MED ORDER — FUROSEMIDE 10 MG/ML IJ SOLN
40.0000 mg | Freq: Every day | INTRAMUSCULAR | Status: DC
  Administered 2016-01-11: 40 mg via INTRAVENOUS
  Filled 2016-01-11 (×2): qty 4

## 2016-01-11 MED ORDER — NITROGLYCERIN IN D5W 200-5 MCG/ML-% IV SOLN
0.0000 ug/min | Freq: Once | INTRAVENOUS | Status: AC
  Administered 2016-01-11: 5 ug/min via INTRAVENOUS
  Filled 2016-01-11: qty 250

## 2016-01-11 MED ORDER — ACETAMINOPHEN 325 MG PO TABS: 650.0000 mg | ORAL_TABLET | ORAL | Status: DC | PRN

## 2016-01-11 MED ORDER — HEPARIN BOLUS VIA INFUSION
4000.0000 [IU] | Freq: Once | INTRAVENOUS | Status: DC
  Filled 2016-01-11: qty 4000

## 2016-01-11 MED ORDER — ISOSORBIDE MONONITRATE ER 30 MG PO TB24
30.0000 mg | ORAL_TABLET | Freq: Every day | ORAL | Status: DC
  Administered 2016-01-11 – 2016-01-16 (×6): 30 mg via ORAL
  Filled 2016-01-11 (×7): qty 1

## 2016-01-11 MED ORDER — ONDANSETRON HCL 4 MG/2ML IJ SOLN: 4.0000 mg | Freq: Four times a day (QID) | INTRAMUSCULAR | Status: DC | PRN

## 2016-01-11 MED ORDER — INSULIN ASPART 100 UNIT/ML ~~LOC~~ SOLN
0.0000 [IU] | SUBCUTANEOUS | Status: DC
  Administered 2016-01-11: 5 [IU] via SUBCUTANEOUS
  Administered 2016-01-11: 3 [IU] via SUBCUTANEOUS
  Administered 2016-01-12: 2 [IU] via SUBCUTANEOUS
  Administered 2016-01-12: 3 [IU] via SUBCUTANEOUS
  Administered 2016-01-12: 2 [IU] via SUBCUTANEOUS

## 2016-01-11 MED ORDER — POTASSIUM CHLORIDE CRYS ER 20 MEQ PO TBCR
40.0000 meq | EXTENDED_RELEASE_TABLET | Freq: Once | ORAL | Status: AC
  Administered 2016-01-11: 40 meq via ORAL
  Filled 2016-01-11: qty 2

## 2016-01-11 MED ORDER — HYDRALAZINE HCL 50 MG PO TABS
50.0000 mg | ORAL_TABLET | Freq: Three times a day (TID) | ORAL | Status: DC
  Administered 2016-01-11 – 2016-01-16 (×15): 50 mg via ORAL
  Filled 2016-01-11 (×11): qty 1
  Filled 2016-01-11: qty 2
  Filled 2016-01-11 (×3): qty 1

## 2016-01-11 MED ORDER — GABAPENTIN 300 MG PO CAPS
300.0000 mg | ORAL_CAPSULE | Freq: Every day | ORAL | Status: DC
  Administered 2016-01-11 – 2016-01-15 (×5): 300 mg via ORAL
  Filled 2016-01-11 (×5): qty 1

## 2016-01-11 MED ORDER — FUROSEMIDE 20 MG PO TABS: 40.0000 mg | ORAL_TABLET | Freq: Every day | ORAL | Status: DC

## 2016-01-11 MED ORDER — BRINZOLAMIDE 1 % OP SUSP
1.0000 [drp] | Freq: Three times a day (TID) | OPHTHALMIC | Status: DC
  Administered 2016-01-11 – 2016-01-16 (×16): 1 [drp] via OPHTHALMIC
  Filled 2016-01-11 (×2): qty 10

## 2016-01-11 MED ORDER — LORATADINE 10 MG PO TABS
10.0000 mg | ORAL_TABLET | Freq: Every day | ORAL | Status: DC
  Administered 2016-01-11: 10 mg via ORAL

## 2016-01-11 MED ORDER — FUROSEMIDE 10 MG/ML IJ SOLN
80.0000 mg | Freq: Once | INTRAMUSCULAR | Status: AC
  Administered 2016-01-11: 80 mg via INTRAVENOUS
  Filled 2016-01-11: qty 8

## 2016-01-11 MED ORDER — HEPARIN SODIUM (PORCINE) 5000 UNIT/ML IJ SOLN
4000.0000 [IU] | Freq: Once | INTRAMUSCULAR | Status: AC
  Administered 2016-01-11: 4000 [IU] via INTRAVENOUS
  Filled 2016-01-11: qty 1

## 2016-01-11 MED ORDER — MOMETASONE FURO-FORMOTEROL FUM 100-5 MCG/ACT IN AERO
2.0000 | INHALATION_SPRAY | Freq: Two times a day (BID) | RESPIRATORY_TRACT | Status: DC
  Administered 2016-01-11 – 2016-01-16 (×9): 2 via RESPIRATORY_TRACT
  Filled 2016-01-11 (×2): qty 8.8

## 2016-01-11 MED ORDER — HEPARIN (PORCINE) IN NACL 100-0.45 UNIT/ML-% IJ SOLN
1600.0000 [IU]/h | INTRAMUSCULAR | Status: DC
  Administered 2016-01-11: 1400 [IU]/h via INTRAVENOUS
  Administered 2016-01-12: 1600 [IU]/h via INTRAVENOUS
  Filled 2016-01-11: qty 250

## 2016-01-11 MED ORDER — BUPROPION HCL ER (SR) 150 MG PO TB12
150.0000 mg | ORAL_TABLET | Freq: Every day | ORAL | Status: DC
  Administered 2016-01-11 – 2016-01-13 (×3): 150 mg via ORAL
  Filled 2016-01-11 (×3): qty 1

## 2016-01-11 MED ORDER — INSULIN DETEMIR 100 UNIT/ML ~~LOC~~ SOLN
10.0000 [IU] | Freq: Every day | SUBCUTANEOUS | Status: DC
  Administered 2016-01-11 – 2016-01-15 (×5): 10 [IU] via SUBCUTANEOUS
  Filled 2016-01-11 (×10): qty 0.1

## 2016-01-11 MED ORDER — ASPIRIN EC 81 MG PO TBEC
81.0000 mg | DELAYED_RELEASE_TABLET | Freq: Every day | ORAL | Status: DC
  Administered 2016-01-11 – 2016-01-16 (×6): 81 mg via ORAL
  Filled 2016-01-11 (×6): qty 1

## 2016-01-11 MED ORDER — FUROSEMIDE 40 MG PO TABS
40.0000 mg | ORAL_TABLET | Freq: Every day | ORAL | Status: DC
  Administered 2016-01-12: 40 mg via ORAL
  Filled 2016-01-11: qty 1

## 2016-01-11 MED ORDER — SODIUM CHLORIDE 0.9 % IV SOLN
INTRAVENOUS | Status: DC
  Administered 2016-01-11: 10 mL/h via INTRAVENOUS

## 2016-01-11 MED ORDER — HEPARIN (PORCINE) IN NACL 100-0.45 UNIT/ML-% IJ SOLN
1400.0000 [IU]/h | INTRAMUSCULAR | Status: DC
  Filled 2016-01-11 (×2): qty 250

## 2016-01-11 MED ORDER — INSULIN ASPART 100 UNIT/ML ~~LOC~~ SOLN
0.0000 [IU] | SUBCUTANEOUS | Status: DC
  Administered 2016-01-11: 2 [IU] via SUBCUTANEOUS
  Administered 2016-01-11: 7 [IU] via SUBCUTANEOUS
  Filled 2016-01-11 (×2): qty 1

## 2016-01-11 MED ORDER — HEPARIN SODIUM (PORCINE) 5000 UNIT/ML IJ SOLN: 60.0000 [IU]/kg | INTRAMUSCULAR | Status: DC

## 2016-01-11 MED ORDER — PRAVASTATIN SODIUM 40 MG PO TABS
40.0000 mg | ORAL_TABLET | Freq: Every day | ORAL | Status: DC
  Administered 2016-01-11 – 2016-01-15 (×5): 40 mg via ORAL
  Filled 2016-01-11 (×5): qty 1

## 2016-01-11 NOTE — Care Management Note (Addendum)
Case Management Note  Patient Details  Name: [name] [name] MRN: [id number] Date of Birth: 01/24/1951  Subjective/Objective:       Admitted with SOB,  PMHx DM Type 2, CAD native artery S/P CABG, SUBSTANCE ABUSE (cocaine abuse), Chronic Systolic and Diastolic CHF, HTN, HLD, CKD stage III, Thyroid Nodule. Resides with boyfriend, [name] [name]. Receives PCS with  [facility]), everyday from 11am-3pm. Pt states uses cane with ambulation.  Action/Plan: Awaiting PT/OT evaluations ......  CM to f/u with disposition needs. Return to home when medically stable.  Expected Discharge Date:                  Expected Discharge Plan:  Home/Self Care  In-House Referral:     Discharge planning Services  CM Consult  Post Acute Care Choice:    Choice offered to:     DME Arranged:    DME Agency:     HH Arranged:    HH Agency:     Status of Service:  In process, will continue to follow  Medicare Important Message Given:    Date Medicare IM Given:    Medicare IM give by:    Date Additional Medicare IM Given:    Additional Medicare Important Message give by:     If discussed at [place] of Stay Meetings, dates discussed:    Additional Comments: CM received consult : Heart failure home health screen and may place order for PT/OT eval and treat if indicated. CM spoke with pt regarding CHF. Pt stated CHF is new to her. CM provided pt with CHF packet and nurse is going to show CHF video to pt. Pt states she does own a scale. PT/OT evaluations place by CM. CM to f/u with d/c needs.   Gathel Guinan [facility]) [phone]   Sharin Mons, RN 01/11/2016, 4:38 PM

## 2016-01-11 NOTE — Progress Notes (Signed)
[place] TEAM 1 - Stepdown/ICU TEAM Progress Note  [name] [name] [id number] DOB: [birth year] DOA: 01/11/2016 PCP: Marlou Sa ERIC, MD  Admit HPI / Brief Narrative: 65 y.o. BF PMHx DM Type 2, CAD native artery S/P CABG, SUBSTANCE ABUSE (cocaine abuse), Chronic Systolic and Diastolic CHF, HTN, HLD, CKD stage III, Thyroid Nodule.  Patient presents to the ED with acute onset gradually worsening SOB that onset 2 hours ago. Patient used cocaine on Monday for the first time in 6 months she states. She claims no chest pain after use until today. Today she developed SOB, and called EMS. She had brief episode of chest pain that lead to consultation with cardiology, NTG, and heparin being started on the patient. She is currently chest pain free. Her SOB has improved with lasix, albuterol, NTG, and Atrovent.   HPI/Subjective: 1/13 A/O 4, NAD, negative CP, negative SOB. Patient just admitted early this a.m.  Assessment/Plan: Acute on chronic combined CHF - -echocardiogram; see results below -Strict I&O; since admission -1.3 L -Daily weight admission weight 126.6 kg -Imdur 30 mg daily -Metoprolol 25 mgBID  Pulmonary hypertension   Chest pain/SOB -Secondary to cocaine use and CHF -Lasix 40 mg daily (home dose)  HTN  -see CHF  Elevated troponin/Demand ischemia -Patient evaluated by cardiology medical management -Continue to trend troponin  CKD stage III (baseline ~Cr 1.6-1.8) -current Cr within baseline  DM Type 2 uncontrolled -05/12/2015 hemoglobin A1c= 7.2 -Lantus 10 units daily -Increase to moderate SSI  Substance abuse -Consulted CSW for resources and area to help with drug abuse  Tobacco abuse -Patient states would like to discontinue smoking. Agreed to Wellbutrin 150 mg daily -After 2 days increased to 150 mg BID      Code Status: FULL Family Communication: no family present at time of exam Disposition Plan: SNF    Consultants:   Procedure/Significant  Events: 1/13 echocardiogram; - Left ventricle: moderate LVH. -LVEF=35% to 40%. No mural thrombus identified with contrast.Diffuse hypokinesis. -(grade 2 diastolic dysfunction). - Left atrium: Moderately dilated - Inferior vena cava: The vessel was dilated. The respirophasicdiameter changes were blunted (< 50%), c/w elevated CVP     Culture   Antibiotics:   DVT prophylaxis: Heparin drip   Devices    LINES / TUBES:      Continuous Infusions: . sodium chloride 10 mL/hr (01/11/16 0236)  . heparin 1,600 Units/hr (01/11/16 1733)    Objective: VITAL SIGNS: Temp: 98.6 F (37 C) (01/13 1600) Temp Source: Oral (01/13 1600) BP: 121/75 mmHg (01/13 1418) Pulse Rate: 79 (01/13 1418) SPO2; FIO2:   Intake/Output Summary (Last 24 hours) at 01/11/16 1908 Last data filed at 01/11/16 1137  Gross per 24 hour  Intake      0 ml  Output   1375 ml  Net  -1375 ml     Exam: General: A/O 4, NAD, No acute respiratory distress Eyes: Negative headache, eye pain, double vision,negative scleral hemorrhage ENT: Negative Runny nose, negative ear pain, negative gingival bleeding, Neck:  Negative scars, masses, torticollis, lymphadenopathy, JVD Lungs: Clear to auscultation bilaterally without wheezes or crackles Cardiovascular: Regular rate and rhythm without murmur gallop or rub normal S1 and S2 Abdomen:negative abdominal pain, nondistended, positive soft, bowel sounds, no rebound, no ascites, no appreciable mass Extremities: No significant cyanosis, clubbing, or edema bilateral lower extremities Psychiatric:  Negative depression, negative anxiety, negative fatigue, negative mania  Neurologic:  Cranial nerves II through XII intact, tongue/uvula midline, all extremities muscle strength 5/5, sensation intact throughout, negative dysarthria, negative  expressive aphasia, negative receptive aphasia.   Data Reviewed: Basic Metabolic Panel:  Recent Labs Lab 01/11/16 0225  NA 144  K 3.2*    CL 107  CO2 22  GLUCOSE 296*  BUN 20  CREATININE 1.77*  CALCIUM 9.2   Liver Function Tests:  Recent Labs Lab 01/11/16 0225  AST 16  ALT 9*  ALKPHOS 111  BILITOT 0.8  PROT 7.8  ALBUMIN 3.3*   No results for input(s): LIPASE, AMYLASE in the last 168 hours. No results for input(s): AMMONIA in the last 168 hours. CBC:  Recent Labs Lab 01/11/16 0225  WBC 12.4*  HGB 9.3*  HCT 32.2*  MCV 70.8*  PLT 462*   Cardiac Enzymes:  Recent Labs Lab 01/11/16 0645 01/11/16 0922 01/11/16 1500  TROPONINI 0.35* 1.02* 2.35*   BNP (last 3 results)  Recent Labs  05/12/15 0404 05/14/15 0312 01/11/16 1139  BNP 439.6* 471.1* 1097.7*    ProBNP (last 3 results) No results for input(s): PROBNP in the last 8760 hours.  CBG:  Recent Labs Lab 01/11/16 0827 01/11/16 1317 01/11/16 1753  GLUCAP 312* 170* 86    No results found for this or any previous visit (from the past 240 hour(s)).   Studies:  Recent x-ray studies have been reviewed in detail by the Attending Physician  Scheduled Meds:  Scheduled Meds: . aspirin EC  81 mg Oral Daily  . brinzolamide  1 drop Both Eyes TID  . buPROPion  150 mg Oral Daily  . furosemide  40 mg Intravenous Daily  . [START ON 01/12/2016] furosemide  40 mg Oral Daily  . gabapentin  300 mg Oral QHS  . hydrALAZINE  50 mg Oral 3 times per day  . insulin aspart  0-15 Units Subcutaneous 6 times per day  . insulin detemir  10 Units Subcutaneous QHS  . isosorbide mononitrate  30 mg Oral Daily  . latanoprost  1 drop Both Eyes QHS  . loratadine  10 mg Oral Daily  . metoprolol tartrate  25 mg Oral BID  . mometasone-formoterol  2 puff Inhalation BID  . pravastatin  40 mg Oral QHS  . traMADol  50 mg Oral TID    Time spent on care of this patient: 40 mins   Tomie Spizzirri, Geraldo Docker , MD  [facility] Office  [phone] Pager [phone]  On-Call/Text Page:      [email or web address]      password [email or web address]  If 7PM-7AM, please contact  night-coverage [email or web address] Password [email or web address] 01/11/2016, 7:08 PM   LOS: 0 days   Care during the described time interval was provided by me .  I have reviewed this patient's available data, including medical history, events of note, physical examination, and all test results as part of my evaluation. I have personally reviewed and interpreted all radiology studies.   Dia Crawford, MD [phone] Pager

## 2016-01-11 NOTE — ED Notes (Signed)
Pt reports to the ED for eval of resp distress that began approx 1 hour ago. Upon EMS arrival she was noted to be in the 150s. Pt received several rounds of adenosine and vagal maneuvers without conversion. Pt had an episode of CP earlier but it has currently resolved after 324 of ASA and 1 nitro. She also received 10 mg of albuterol and 0.5 of Atrovent PTA. Pt A&Ox4, pale and diaphoretic. Resp rapid.

## 2016-01-11 NOTE — Progress Notes (Signed)
ANTICOAGULATION CONSULT NOTE - Initial Consult  Pharmacy Consult for Heparin Indication: chest pain/ACS  Allergies  Allergen Reactions  . Naproxen     rash    Patient Measurements: Height: 5' 8.5" (174 cm) Weight: 279 lb 1.6 oz (126.6 kg) IBW/kg (Calculated) : 65.05 Heparin Dosing Weight: 92 kg  Vital Signs: Temp: 98 F (36.7 C) (01/13 1418) Temp Source: Oral (01/13 1418) BP: 121/75 mmHg (01/13 1418) Pulse Rate: 79 (01/13 1418)  Labs:  Recent Labs  01/11/16 0225 01/11/16 0645 01/11/16 0922 01/11/16 1500  HGB 9.3*  --   --   --   HCT 32.2*  --   --   --   PLT 462*  --   --   --   APTT 27  --   --   --   LABPROT 15.3*  --   --   --   INR 1.19  --   --   --   HEPARINUNFRC  --   --   --  0.21*  CREATININE 1.77*  --   --   --   TROPONINI  --  0.35* 1.02* 2.35*    Estimated Creatinine Clearance: 44.9 mL/min (by C-G formula based on Cr of 1.77).   Medical History: Past Medical History  Diagnosis Date  . Essential hypertension   . Diabetes mellitus     diagnosed in 2008  . Glaucoma   . Hyperlipidemia   . CVA (cerebral infarction)     a. right internal capsule stroke in 12/2006  . Left-sided sensory deficit present   . Cocaine abuse     crack cocaine heavily until 2008 then sporadic use since then  . Tobacco abuse   . Thyroid nodule     FNA in [id number] showed follicular cells but not definate neoplasm  . Coronary artery disease     a. 06/2012 NSTEMI/CABG x 3 (LIMA->LAD, VG->OM2, VG->LCX);  b. 04/2015 MV: EF<30%, mid ant, apicalanterior, apical infarct;  c. 04/2015 Cath: LM nl, LAD 90p, LCX 69m, OM1 min irregs, RCA mild dzs, LIMA->LAD nl w/ dist LAD dzs, VG->OM2 nl, VG->LCX nl-->Med Rx.  . Arthritis of left shoulder region 03/23/2013  . Chronic combined systolic and diastolic CHF (congestive heart failure) (HCC)     a. EF 40-45%, mild LVH, mid apicalanteroseptal and apical HK.  Marland Kitchen PVD (peripheral vascular disease) ([place])     a. 06/2012 ABI's: R - 0.73, L - 0.71.  Marland Kitchen CKD  (chronic kidney disease), stage III     Assessment: 65 y.o. F presents with respiratory distress, also with episode of CP earlier. Noted pt uses cocaine - last use on Monday per pt. No anticoagulation PTA. To begin heparin for r/o ACS and then put on hold. Cards suspects troponin elevation related to pulmonary edema and not ACS but would like to continue heparin gtt for now just in case and trend her troponins. Hgb low at 9.3, plts 462. No s/s of bleed  Heparin level 0.21 on 1400 units/hr. No significant interruptions with infusion.  Goal of Therapy:  Heparin level 0.3-0.7 units/ml Monitor platelets by anticoagulation protocol: Yes   Plan:  Increase heparin gtt to 1600 units/hr F/u 6 hr HL at 2300 Monitor daily HL, CBC, s/s of bleed  Maryanna Shape, PharmD, BCPS  Clinical Pharmacist  Pager: [phone]   01/11/2016 4:50 PM

## 2016-01-11 NOTE — ED Notes (Signed)
MD texted Re increased troponin

## 2016-01-11 NOTE — Progress Notes (Signed)
ANTICOAGULATION CONSULT NOTE - Initial Consult  Pharmacy Consult for Heparin Indication: chest pain/ACS  Allergies  Allergen Reactions  . Naproxen     rash    Patient Measurements: Height: 5' 8.5" (174 cm) Weight: 273 lb 13 oz (124.2 kg) IBW/kg (Calculated) : 65.05 Heparin Dosing Weight: 92 kg  Vital Signs: Temp: 98.1 F (36.7 C) (01/13 0345) Temp Source: Oral (01/13 0345) BP: 136/70 mmHg (01/13 0846) Pulse Rate: 93 (01/13 0815)  Labs:  Recent Labs  01/11/16 0225 01/11/16 0645  HGB 9.3*  --   HCT 32.2*  --   PLT 462*  --   APTT 27  --   LABPROT 15.3*  --   INR 1.19  --   CREATININE 1.77*  --   TROPONINI  --  0.35*    Estimated Creatinine Clearance: 44.4 mL/min (by C-G formula based on Cr of 1.77).   Medical History: Past Medical History  Diagnosis Date  . Essential hypertension   . Diabetes mellitus     diagnosed in 2008  . Glaucoma   . Hyperlipidemia   . CVA (cerebral infarction)     a. right internal capsule stroke in 12/2006  . Left-sided sensory deficit present   . Cocaine abuse     crack cocaine heavily until 2008 then sporadic use since then  . Tobacco abuse   . Thyroid nodule     FNA in [id number] showed follicular cells but not definate neoplasm  . Coronary artery disease     a. 06/2012 NSTEMI/CABG x 3 (LIMA->LAD, VG->OM2, VG->LCX);  b. 04/2015 MV: EF<30%, mid ant, apicalanterior, apical infarct;  c. 04/2015 Cath: LM nl, LAD 90p, LCX 9m, OM1 min irregs, RCA mild dzs, LIMA->LAD nl w/ dist LAD dzs, VG->OM2 nl, VG->LCX nl-->Med Rx.  . Arthritis of left shoulder region 03/23/2013  . Chronic combined systolic and diastolic CHF (congestive heart failure) (HCC)     a. EF 40-45%, mild LVH, mid apicalanteroseptal and apical HK.  Marland Kitchen PVD (peripheral vascular disease) ([place])     a. 06/2012 ABI's: R - 0.73, L - 0.71.  Marland Kitchen CKD (chronic kidney disease), stage III     Assessment: 65 y.o. F presents with respiratory distress, also with episode of CP earlier. Noted pt  uses cocaine - last use on Monday per pt. No anticoagulation PTA. To begin heparin for r/o ACS and then put on hold. Cards suspects troponin elevation related to pulmonary edema and not ACS but would like to continue heparin gtt for now just in case and trend her troponins. Hgb low at 9.3, plts 462. No s/s of bleed  Goal of Therapy:  Heparin level 0.3-0.7 units/ml Monitor platelets by anticoagulation protocol: Yes   Plan:  Restart heparin gtt at 1,400 units/hr Check 6 hr HL Monitor daily HL, CBC, s/s of bleed  Elenor Quinones, PharmD, [facility] Clinical Pharmacist Pager [phone] 01/11/2016 9:14 AM

## 2016-01-11 NOTE — ED Notes (Signed)
  CBG 170

## 2016-01-11 NOTE — Progress Notes (Signed)
Patient seen and examined, history reviewed. Agree with Dr. Evette Georges assessment this am. Much better with IV diuresis. Patient did use cocaine on Monday but tells me it is the first time in 6 months. She also has been eating a full bag of pork rinds daily. She does not weigh herself at home. Reports compliance with medication.   Will continue IV diuresis Update Echo Should be able to wean IV Ntg quickly. I suspect troponin elevation related to acute pulmonary edema and not ACS. Cardiac cath in May 2016 showed patent grafts with similar presentation Avoid beta blocker with history of cocaine use Counseled on need for low sodium diet and cessation of cocaine use.   Onis Markoff [place] MD, [facility]  01/11/2016 8:57 AM

## 2016-01-11 NOTE — ED Notes (Signed)
Attempted report 

## 2016-01-11 NOTE — ED Notes (Signed)
Dr Martinique with cardiology at bedside.

## 2016-01-11 NOTE — Progress Notes (Signed)
  Echocardiogram 2D Echocardiogram with Definity has been performed.  Diamond Nickel 01/11/2016, 4:37 PM

## 2016-01-11 NOTE — ED Provider Notes (Signed)
CSN: [id number]     Arrival date & time 01/11/16  0214 History  By signing my name below, I, [name] [name], attest that this documentation has been prepared under the direction and in the presence of Merryl Hacker, MD. Electronically Signed: Julien Nordmann, ED Scribe. 01/11/2016. 2:32 AM.    Chief Complaint  Patient presents with  . Shortness of Breath      The history is provided by the patient and the EMS personnel. No language interpreter was used.   HPI Comments: [name] [name] is a 65 y.o. female with a hx of DM, CVA, CABG, hyperlipidemia, CAD, cocaine abuse, PVD, CHF, DM, and CKD brought in by ambulance, who presents to the Emergency Department complaining of acute onset, gradual worsening shortness of breath that occurred about 2 hours ago. Per EMS, pt's heart rate was noted to be in the 150s. She received multiple rounds of adenosine and vagal maneuvers without conversion. She had one episode of chest pain earlier but it was resolved with 324 of aspirin and 1 nitro. EMS also gave her 10 mg of albuterol and .5 of atrovent. She notes she has had SVT before. Pt does not have any chest pain currently. Pt does not take any blood thinners. She states the last time she used cocaine was Monday.   Level V caveat for acuity of condition  Past Medical History  Diagnosis Date  . Essential hypertension   . Diabetes mellitus     diagnosed in 2008  . Glaucoma   . Hyperlipidemia   . CVA (cerebral infarction)     a. right internal capsule stroke in 12/2006  . Left-sided sensory deficit present   . Cocaine abuse     crack cocaine heavily until 2008 then sporadic use since then  . Tobacco abuse   . Thyroid nodule     FNA in [id number] showed follicular cells but not definate neoplasm  . Coronary artery disease     a. 06/2012 NSTEMI/CABG x 3 (LIMA->LAD, VG->OM2, VG->LCX);  b. 04/2015 MV: EF<30%, mid ant, apicalanterior, apical infarct;  c. 04/2015 Cath: LM nl, LAD 90p, LCX 92m, OM1 min irregs, RCA mild  dzs, LIMA->LAD nl w/ dist LAD dzs, VG->OM2 nl, VG->LCX nl-->Med Rx.  . Arthritis of left shoulder region 03/23/2013  . Chronic combined systolic and diastolic CHF (congestive heart failure) (HCC)     a. EF 40-45%, mild LVH, mid apicalanteroseptal and apical HK.  Marland Kitchen PVD (peripheral vascular disease) ([place])     a. 06/2012 ABI's: R - 0.73, L - 0.71.  Marland Kitchen CKD (chronic kidney disease), stage III    Past Surgical History  Procedure Laterality Date  . Coronary artery bypass graft  07/09/2012    Procedure: CORONARY ARTERY BYPASS GRAFTING (CABG);  Surgeon: Ivin Poot, MD;  Location: [place];  Service: Open Heart Surgery;  Laterality: N/A;  . Cardiac catheterization    . Sternal wound debridement  08/17/2012    Procedure: STERNAL WOUND DEBRIDEMENT;  Surgeon: Ivin Poot, MD;  Location: [place];  Service: Thoracic;  Laterality: N/A;  wound vac application  . Sternal wound debridement  08/24/2012    Procedure: STERNAL WOUND DEBRIDEMENT;  Surgeon: Ivin Poot, MD;  Location: [place];  Service: Thoracic;  Laterality: N/A;  . Sternal wound debridement  09/01/2012    Procedure: STERNAL WOUND DEBRIDEMENT;  Surgeon: Ivin Poot, MD;  Location: [place];  Service: Thoracic;  Laterality: N/A;  . Sternal wound debridement  09/20/2012  Procedure: STERNAL WOUND DEBRIDEMENT;  Surgeon: Ivin Poot, MD;  Location: [facility] OR;  Service: Thoracic;  Laterality: N/A;  wound vac change  . Left heart catheterization with coronary angiogram N/A 06/29/2012    Procedure: LEFT HEART CATHETERIZATION WITH CORONARY ANGIOGRAM;  Surgeon: Peter M Martinique, MD;  Location: [facility] CATH LAB;  Service: Cardiovascular;  Laterality: N/A;  . Cardiac catheterization N/A 05/17/2015    Procedure: Left Heart Cath and Cors/Grafts Angiography;  Surgeon: [name], MD;  Location: [place] CV LAB;  Service: Cardiovascular;  Laterality: N/A;   Family History  Problem Relation Age of Onset  . Other      no known family CAD  . Diabetes Mother   .  Hypertension Mother   . Hyperlipidemia Father    Social History  Substance Use Topics  . Smoking status: Current Every Day Smoker -- 0.25 packs/day  . Smokeless tobacco: None  . Alcohol Use: No   OB History    No data available     Review of Systems  Constitutional: Negative for fever.  Respiratory: Positive for shortness of breath.   Cardiovascular: Negative for chest pain.  All other systems reviewed and are negative.     Allergies  Naproxen  Home Medications   Prior to Admission medications   Medication Sig Start Date End Date Taking? Authorizing Provider  aspirin EC 81 MG tablet Take 81 mg by mouth daily.    Historical Provider, MD  brinzolamide (AZOPT) 1 % ophthalmic suspension Place 1 drop into both eyes 3 (three) times daily.    Historical Provider, MD  cetirizine (ZYRTEC) 10 MG tablet Take 10 mg by mouth daily as needed.     Historical Provider, MD  Fluticasone-Salmeterol (ADVAIR) 250-50 MCG/DOSE AEPB Inhale 1 puff into the lungs every 12 (twelve) hours.    Historical Provider, MD  furosemide (LASIX) 40 MG tablet Take 1 tablet (40 mg total) by mouth daily. Take additional dose if you experience weight gain. 05/17/15   [name], DO  gabapentin (NEURONTIN) 100 MG capsule Take 300 mg by mouth at bedtime.     Historical Provider, MD  glimepiride (AMARYL) 4 MG tablet Take 4 mg by mouth daily before breakfast.    Historical Provider, MD  hydrALAZINE (APRESOLINE) 50 MG tablet Take 1 tablet (50 mg total) by mouth every 8 (eight) hours. 05/15/15   [name], MD  Insulin Glargine (LANTUS SOLOSTAR) 100 UNIT/ML Solostar Pen Inject 25 Units into the skin daily at 10 pm. 05/16/15   [name], DO  isosorbide mononitrate (IMDUR) 30 MG 24 hr tablet Take 1 tablet (30 mg total) by mouth daily. 05/15/15   [name], MD  latanoprost (XALATAN) 0.005 % ophthalmic solution Place 1 drop into both eyes at bedtime.    Historical Provider, MD  metFORMIN (GLUCOPHAGE) 1000 MG tablet Take 1,000  mg by mouth 2 (two) times daily with a meal.    Historical Provider, MD  metoprolol tartrate (LOPRESSOR) 25 MG tablet Take 1 tablet (25 mg total) by mouth 2 (two) times daily. 08/22/12   [name], PA-C  pravastatin (PRAVACHOL) 40 MG tablet Take 40 mg by mouth at bedtime.    Historical Provider, MD  traMADol (ULTRAM) 50 MG tablet Take 50 mg by mouth 3 (three) times daily.     Historical Provider, MD   Triage vitals: BP 139/60 mmHg  Pulse 117  Temp(Src) 98.5 F (36.9 C) (Oral)  Resp 26  Ht 5' 8.5" (1.74 m)  Wt 263  lb (119.296 kg)  BMI 39.40 kg/m2  SpO2 99% Physical Exam  Constitutional: She is oriented to person, place, and time.  morbidly obese,Ill-appearing, tachypnea, tachycardic  HENT:  Head: Normocephalic and atraumatic.  Eyes: Pupils are equal, round, and reactive to light.  Cardiovascular: Normal heart sounds.   Tachycardia, regular rhythm  Pulmonary/Chest: She is in respiratory distress. She has no wheezes. She has rales.  Extensive anterior chest wall scarring  Abdominal: Soft. Bowel sounds are normal. There is no tenderness. There is no rebound.  Musculoskeletal: She exhibits edema.  Neurological: She is alert and oriented to person, place, and time.  Skin: Skin is warm and dry.  diaphoretic  Psychiatric: She has a normal mood and affect.  Nursing note and vitals reviewed.   ED Course  Procedures   CRITICAL CARE Performed by: Merryl Hacker   Total critical care time: 45 minutes  Critical care time was exclusive of separately billable procedures and treating other patients.  Critical care was necessary to treat or prevent imminent or life-threatening deterioration.  Critical care was time spent personally by me on the following activities: development of treatment plan with patient and/or surrogate as well as nursing, discussions with consultants, evaluation of patient's response to treatment, examination of patient, obtaining history from patient  or surrogate, ordering and performing treatments and interventions, ordering and review of laboratory studies, ordering and review of radiographic studies, pulse oximetry and re-evaluation of patient's condition.  DIAGNOSTIC STUDIES: Oxygen Saturation is 99% on RA, normal by my interpretation.  COORDINATION OF CARE:  2:30 AM Discussed treatment plan which includes with chest x-ray, lab work, contact cardiologist pt at bedside and pt agreed to plan.  Labs Review Labs Reviewed  CBC - Abnormal; Notable for the following:    WBC 12.4 (*)    Hemoglobin 9.3 (*)    HCT 32.2 (*)    MCV 70.8 (*)    MCH 20.4 (*)    MCHC 28.9 (*)    RDW 19.8 (*)    Platelets 462 (*)    All other components within normal limits  COMPREHENSIVE METABOLIC PANEL - Abnormal; Notable for the following:    Potassium 3.2 (*)    Glucose, Bld 296 (*)    Creatinine, Ser 1.77 (*)    Albumin 3.3 (*)    ALT 9 (*)    GFR calc non Af Amer 29 (*)    GFR calc Af Amer 34 (*)    All other components within normal limits  PROTIME-INR - Abnormal; Notable for the following:    Prothrombin Time 15.3 (*)    All other components within normal limits  APTT  URINE RAPID DRUG SCREEN, HOSP PERFORMED  HEPARIN LEVEL (UNFRACTIONATED)  TROPONIN I  TROPONIN I  TROPONIN I  I-STAT TROPOININ, ED    Imaging Review Dg Chest Portable 1 View  01/11/2016  CLINICAL DATA:  Initial evaluation for acute shortness of breath. EXAM: PORTABLE CHEST 1 VIEW COMPARISON:  Prior study from 05/12/2015. FINDINGS: Median sternotomy wires underlying CABG markers again noted, stable. Moderate cardiomegaly is present. Mediastinal silhouette within normal limits. Lungs are normally inflated. Defibrillator pad overlies the left chest. Moderate diffuse pulmonary edema present. No significant pleural effusion on this single frontal projection. No definite focal infiltrates. No pneumothorax. No acute osseous abnormality. IMPRESSION: Cardiomegaly with moderate diffuse  pulmonary edema, consistent with acute CHF exacerbation. Electronically Signed   By: Jeannine Boga M.D.   On: 01/11/2016 03:18   I have personally reviewed and  evaluated these images and lab results as part of my medical decision-making.   EKG Interpretation   Date/Time:  Friday January 11 2016 02:21:38 EST Ventricular Rate:  150 PR Interval:  88 QRS Duration: 106 QT Interval:  280 QTC Calculation: 442 R Axis:   57 Text Interpretation:  Sinus tachycardia Atrial premature complex Probable  left atrial enlargement Left ventricular hypertrophy Anterior infarct,  acute (LAD) Baseline wander in lead(s) V2 ST Elevation in anterior leads  new when compared to prior Confirmed by Nikolaj Geraghty  MD, Mikeisha Lemonds ([id number]) on  01/11/2016 6:06:29 AM      MDM   Final diagnoses:  Respiratory distress  Acute systolic heart failure ([place])    Patient presents with tachycardia. Per EMS appeared to be in SVT. Upon arrival she is tachycardic and tachypneic with respiratory distress. Not in extremis. EKG obtained and shows sinus tachycardia. She has ST elevations anteriorly which appear new.STEMI alert was activated. Discussed with Dr. Ellyn Hack and the fellow on call. Given that she is not having any active chest pain, doubt that this is acute ischemia.STEMI alert was deactivated Patient symptoms are likely rate related. She was started on nitroglycerin for presumed CHF exacerbation.Heart rate normalized without intervention and patient became more comfortable. She is also extremely hypertensive upon arrival at219/87. This improved with nitroglycerin. Initial troponin is negative.  Leukocytosis. Otherwise reassuring. On multiple rechecks, patient is comfortable on nonrebreather.  Cardiology to consult. Will admit to the step down unit.  I personally performed the services described in this documentation, which was scribed in my presence. The recorded information has been reviewed and is accurate.   Merryl Hacker, MD 01/11/16 [id number]

## 2016-01-11 NOTE — ED Notes (Signed)
Pt requested to be place on [place] instead of NR mask, pt unable to keep SPO2 above 88%. Pt placed back on NR mask.

## 2016-01-11 NOTE — H&P (Signed)
[facility] History and Physical  [name] [id number] DOB: March 25, 1951 DOA: 01/11/2016  Referring physician: EDP PCP: Kevan Ny, MD   Chief Complaint: SOB   HPI: [name] [name] is a 65 y.o. female with h/o DM, CAD with CABG, cocaine abuse, CHF.  Patient presents to the ED with acute onset gradually worsening SOB that onset 2 hours ago.  Patient used cocaine on Monday for the first time in 6 months she states.  She claims no chest pain after use until today.  Today she developed SOB, and called EMS.  She had brief episode of chest pain that lead to consultation with cardiology, NTG, and heparin being started on the patient.  She is currently chest pain free.  Her SOB has improved with lasix, albuterol, NTG, and Atrovent.  Review of Systems: Systems reviewed.  As above, otherwise negative  Past Medical History  Diagnosis Date  . Essential hypertension   . Diabetes mellitus     diagnosed in 2008  . Glaucoma   . Hyperlipidemia   . CVA (cerebral infarction)     a. right internal capsule stroke in 12/2006  . Left-sided sensory deficit present   . Cocaine abuse     crack cocaine heavily until 2008 then sporadic use since then  . Tobacco abuse   . Thyroid nodule     FNA in [id number] showed follicular cells but not definate neoplasm  . Coronary artery disease     a. 06/2012 NSTEMI/CABG x 3 (LIMA->LAD, VG->OM2, VG->LCX);  b. 04/2015 MV: EF<30%, mid ant, apicalanterior, apical infarct;  c. 04/2015 Cath: LM nl, LAD 90p, LCX 61m, OM1 min irregs, RCA mild dzs, LIMA->LAD nl w/ dist LAD dzs, VG->OM2 nl, VG->LCX nl-->Med Rx.  . Arthritis of left shoulder region 03/23/2013  . Chronic combined systolic and diastolic CHF (congestive heart failure) (HCC)     a. EF 40-45%, mild LVH, mid apicalanteroseptal and apical HK.  Marland Kitchen PVD (peripheral vascular disease) ([place])     a. 06/2012 ABI's: R - 0.73, L - 0.71.  Marland Kitchen CKD (chronic kidney disease), stage III    Past Surgical History  Procedure Laterality Date   . Coronary artery bypass graft  07/09/2012    Procedure: CORONARY ARTERY BYPASS GRAFTING (CABG);  Surgeon: Ivin Poot, MD;  Location: [place];  Service: Open Heart Surgery;  Laterality: N/A;  . Cardiac catheterization    . Sternal wound debridement  08/17/2012    Procedure: STERNAL WOUND DEBRIDEMENT;  Surgeon: Ivin Poot, MD;  Location: [place];  Service: Thoracic;  Laterality: N/A;  wound vac application  . Sternal wound debridement  08/24/2012    Procedure: STERNAL WOUND DEBRIDEMENT;  Surgeon: Ivin Poot, MD;  Location: [place];  Service: Thoracic;  Laterality: N/A;  . Sternal wound debridement  09/01/2012    Procedure: STERNAL WOUND DEBRIDEMENT;  Surgeon: Ivin Poot, MD;  Location: [place];  Service: Thoracic;  Laterality: N/A;  . Sternal wound debridement  09/20/2012    Procedure: STERNAL WOUND DEBRIDEMENT;  Surgeon: Ivin Poot, MD;  Location: [facility] OR;  Service: Thoracic;  Laterality: N/A;  wound vac change  . Left heart catheterization with coronary angiogram N/A 06/29/2012    Procedure: LEFT HEART CATHETERIZATION WITH CORONARY ANGIOGRAM;  Surgeon: Peter M Martinique, MD;  Location: [facility] CATH LAB;  Service: Cardiovascular;  Laterality: N/A;  . Cardiac catheterization N/A 05/17/2015    Procedure: Left Heart Cath and Cors/Grafts Angiography;  Surgeon: [name], MD;  Location: [facility]  INVASIVE CV LAB;  Service: Cardiovascular;  Laterality: N/A;   Social History:  reports that she has been smoking.  She does not have any smokeless tobacco history on file. She reports that she uses illicit drugs (Cocaine). She reports that she does not drink alcohol.  Allergies  Allergen Reactions  . Naproxen     rash    Family History  Problem Relation Age of Onset  . Other      no known family CAD  . Diabetes Mother   . Hypertension Mother   . Hyperlipidemia Father      Prior to Admission medications   Medication Sig Start Date End Date Taking? Authorizing Provider  aspirin EC 81 MG tablet  Take 81 mg by mouth daily.    Historical Provider, MD  brinzolamide (AZOPT) 1 % ophthalmic suspension Place 1 drop into both eyes 3 (three) times daily.    Historical Provider, MD  cetirizine (ZYRTEC) 10 MG tablet Take 10 mg by mouth daily as needed.     Historical Provider, MD  Fluticasone-Salmeterol (ADVAIR) 250-50 MCG/DOSE AEPB Inhale 1 puff into the lungs every 12 (twelve) hours.    Historical Provider, MD  furosemide (LASIX) 40 MG tablet Take 1 tablet (40 mg total) by mouth daily. Take additional dose if you experience weight gain. 05/17/15   [name], DO  gabapentin (NEURONTIN) 100 MG capsule Take 300 mg by mouth at bedtime.     Historical Provider, MD  glimepiride (AMARYL) 4 MG tablet Take 4 mg by mouth daily before breakfast.    Historical Provider, MD  hydrALAZINE (APRESOLINE) 50 MG tablet Take 1 tablet (50 mg total) by mouth every 8 (eight) hours. 05/15/15   [name], MD  Insulin Glargine (LANTUS SOLOSTAR) 100 UNIT/ML Solostar Pen Inject 25 Units into the skin daily at 10 pm. 05/16/15   [name], DO  isosorbide mononitrate (IMDUR) 30 MG 24 hr tablet Take 1 tablet (30 mg total) by mouth daily. 05/15/15   [name], MD  latanoprost (XALATAN) 0.005 % ophthalmic solution Place 1 drop into both eyes at bedtime.    Historical Provider, MD  metFORMIN (GLUCOPHAGE) 1000 MG tablet Take 1,000 mg by mouth 2 (two) times daily with a meal.    Historical Provider, MD  metoprolol tartrate (LOPRESSOR) 25 MG tablet Take 1 tablet (25 mg total) by mouth 2 (two) times daily. 08/22/12   [name], PA-C  pravastatin (PRAVACHOL) 40 MG tablet Take 40 mg by mouth at bedtime.    Historical Provider, MD  traMADol (ULTRAM) 50 MG tablet Take 50 mg by mouth 3 (three) times daily.     Historical Provider, MD   Physical Exam: Filed Vitals:   01/11/16 0530 01/11/16 0545  BP: 130/53 125/52  Pulse: 99 99  Temp:    Resp: 19 20    BP 125/52 mmHg  Pulse 99  Temp(Src) 98.1 F (36.7 C) (Oral)  Resp 20   Ht 5' 8.5" (1.74 m)  Wt 124.2 kg (273 lb 13 oz)  BMI 41.02 kg/m2  SpO2 100%  General Appearance:    Alert, oriented, no distress, appears stated age  Head:    Normocephalic, atraumatic  Eyes:    PERRL, EOMI, sclera non-icteric        Nose:   Nares without drainage or epistaxis. Mucosa, turbinates normal  Throat:   Moist mucous membranes. Oropharynx without erythema or exudate.  Neck:   Supple. No carotid bruits.  No thyromegaly.  No lymphadenopathy.  Back:     No CVA tenderness, no spinal tenderness  Lungs:     Clear to auscultation bilaterally, without wheezes, rhonchi or rales  Chest wall:    No tenderness to palpitation  Heart:    Regular rate and rhythm without murmurs, gallops, rubs  Abdomen:     Soft, non-tender, nondistended, normal bowel sounds, no organomegaly  Genitalia:    deferred  Rectal:    deferred  Extremities:   No clubbing, cyanosis or edema.  Pulses:   2+ and symmetric all extremities  Skin:   Skin color, texture, turgor normal, no rashes or lesions  Lymph nodes:   Cervical, supraclavicular, and axillary nodes normal  Neurologic:   CNII-XII intact. Normal strength, sensation and reflexes      throughout    Labs on Admission:  Basic Metabolic Panel:  Recent Labs Lab 01/11/16 0225  NA 144  K 3.2*  CL 107  CO2 22  GLUCOSE 296*  BUN 20  CREATININE 1.77*  CALCIUM 9.2   Liver Function Tests:  Recent Labs Lab 01/11/16 0225  AST 16  ALT 9*  ALKPHOS 111  BILITOT 0.8  PROT 7.8  ALBUMIN 3.3*   No results for input(s): LIPASE, AMYLASE in the last 168 hours. No results for input(s): AMMONIA in the last 168 hours. CBC:  Recent Labs Lab 01/11/16 0225  WBC 12.4*  HGB 9.3*  HCT 32.2*  MCV 70.8*  PLT 462*   Cardiac Enzymes: No results for input(s): CKTOTAL, CKMB, CKMBINDEX, TROPONINI in the last 168 hours.  BNP (last 3 results) No results for input(s): PROBNP in the last 8760 hours. CBG: No results for input(s): GLUCAP in the last 168  hours.  Radiological Exams on Admission: Dg Chest Portable 1 View  01/11/2016  CLINICAL DATA:  Initial evaluation for acute shortness of breath. EXAM: PORTABLE CHEST 1 VIEW COMPARISON:  Prior study from 05/12/2015. FINDINGS: Median sternotomy wires underlying CABG markers again noted, stable. Moderate cardiomegaly is present. Mediastinal silhouette within normal limits. Lungs are normally inflated. Defibrillator pad overlies the left chest. Moderate diffuse pulmonary edema present. No significant pleural effusion on this single frontal projection. No definite focal infiltrates. No pneumothorax. No acute osseous abnormality. IMPRESSION: Cardiomegaly with moderate diffuse pulmonary edema, consistent with acute CHF exacerbation. Electronically Signed   By: Jeannine Boga M.D.   On: 01/11/2016 03:18    EKG: Independently reviewed.  Assessment/Plan Principal Problem:   Acute on chronic combined systolic and diastolic congestive heart failure (HCC) Active Problems:   Cocaine abuse   Coronary artery disease   Type 2 diabetes mellitus (HCC)   CKD (chronic kidney disease), stage III   1. Acute on chronic combined CHF - 1. HF pathway 2. 2d echo ordered 3. Lasix 80 in ED 4. NTG gtt 5. 40mg  IV daily ordered 6. Suspect that recent cocaine use is related, UDS pending 7. No ACEi due to renal insufficiency and baseline CKD stage 3 8. Serial troponins ordered and patient NPO for the moment given CP episode 9. Continue home imdur and lopressor 10. Titrate off NTG gtt as able 2. DM2 - 1. Reduced daily lantus to 10 while NPO 2. senisitive scale SSI 3. Holding metformin 4. Will need adjustment if patient allowed to eat later 3. HTN - continue home meds    Code Status: Full  Family Communication: No family in room Disposition Plan: Admit to inpatient   Time spent: 45 min  GARDNER, JARED M. [facility] Pager [phone]  If 7AM-7PM,  please contact the day team taking care of the  patient [email or web address] Password [facility] 01/11/2016, 6:03 AM

## 2016-01-11 NOTE — ED Notes (Signed)
Paged and spoke with Dr Dia Crawford Re trop of 1.02.  Pt remains in NSR.  Continues to deny chest pain.  He stated to just continue what we have been doing, that troponin is expected to continue elevating.

## 2016-01-11 NOTE — ED Notes (Signed)
Code stemi called. 2 IVs obtained, pt completely undressed, and radio translucent zoll pads placed on patient's chest. Pt ready for transport to cath lab upon cardiology arrival.

## 2016-01-11 NOTE — Progress Notes (Signed)
ANTICOAGULATION CONSULT NOTE - Initial Consult  Pharmacy Consult for Heparin Indication: chest pain/ACS  Allergies  Allergen Reactions  . Naproxen     rash    Patient Measurements: Height: 5' 8.5" (174 cm) Weight: 263 lb (119.296 kg) IBW/kg (Calculated) : 65.05 Heparin Dosing Weight: 92 kg  Vital Signs: Temp: 98.5 F (36.9 C) (01/13 0232) Temp Source: Oral (01/13 0232) BP: 219/87 mmHg (01/13 0232) Pulse Rate: 136 (01/13 0232)  Labs: No results for input(s): HGB, HCT, PLT, APTT, LABPROT, INR, HEPARINUNFRC, CREATININE, CKTOTAL, CKMB, TROPONINI in the last 72 hours.  CrCl cannot be calculated (Patient has no serum creatinine result on file.).   Medical History: Past Medical History  Diagnosis Date  . Essential hypertension   . Diabetes mellitus     diagnosed in 2008  . Glaucoma   . Hyperlipidemia   . CVA (cerebral infarction)     a. right internal capsule stroke in 12/2006  . Left-sided sensory deficit present   . Cocaine abuse     crack cocaine heavily until 2008 then sporadic use since then  . Tobacco abuse   . Thyroid nodule     FNA in [id number] showed follicular cells but not definate neoplasm  . Coronary artery disease     a. 06/2012 NSTEMI/CABG x 3 (LIMA->LAD, VG->OM2, VG->LCX);  b. 04/2015 MV: EF<30%, mid ant, apicalanterior, apical infarct;  c. 04/2015 Cath: LM nl, LAD 90p, LCX 29m, OM1 min irregs, RCA mild dzs, LIMA->LAD nl w/ dist LAD dzs, VG->OM2 nl, VG->LCX nl-->Med Rx.  . Arthritis of left shoulder region 03/23/2013  . Chronic combined systolic and diastolic CHF (congestive heart failure) (HCC)     a. EF 40-45%, mild LVH, mid apicalanteroseptal and apical HK.  Marland Kitchen PVD (peripheral vascular disease) ([place])     a. 06/2012 ABI's: R - 0.73, L - 0.71.  Marland Kitchen CKD (chronic kidney disease), stage III     Medications:  Awaiting electronic med rec  Assessment: 65 y.o. F presents with respiratory distress, also with episode of CP earlier. Noted pt uses cocaine - last use on  Monday per pt. No anticoagulation PTA. To begin heparin for r/o ACS.  Goal of Therapy:  Heparin level 0.3-0.7 units/ml Monitor platelets by anticoagulation protocol: Yes   Plan:  Heparin IV bolus 4000 units Heparin gtt at 1400 units/hr Will f/u heparin level in 6 hours Daily heparin level and CBC  Sherlon Handing, PharmD, BCPS Clinical pharmacist, pager [phone] 01/11/2016,2:36 AM

## 2016-01-11 NOTE — Consult Note (Signed)
Reason for Consult: tachycardia, pulmonary edema Primary Cardiologist: Dr. Martinique  Referring Physician: Dr. Hulan Fess AIYANA STEGMANN is an 65 y.o. female.  HPI: Ms. Dow is a 65 yo woman with PMH of T2DM, CVA, CAD with previous CABG (July 2013), dyslipidemia, peripheral vascular disease, cocaine abuse, chronic kidney disease and last admission for heart failure in May of 2016 who presented to the ER with acute dyspnea. In the ambulance she was noted to be tachycardic to the 150s and had several rounds of adenosine and vagal maneuvers without change in HR. She had a brief episode of chest pain leading to consultation with cardiology about possible ST elevation on ECG but she had no chest pain and negative troponins so no STEMI activation. She endorsd her last use of cocaine was Monday. She tells me she currently feels much better after initiation of the IV NTG gtt. She had a 5/119/16 LHC revealed 3/3 patent grafts. She's not been on HF medications 2/2 renal function.   She denies dietary indiscretion or abdominal or extremity swelling. She says the dyspnea came on suddenly. She currently feels much improved. She denies recent illness or infectious symptoms.     Past Medical History  Diagnosis Date  . Essential hypertension   . Diabetes mellitus     diagnosed in 2008  . Glaucoma   . Hyperlipidemia   . CVA (cerebral infarction)     a. right internal capsule stroke in 12/2006  . Left-sided sensory deficit present   . Cocaine abuse     crack cocaine heavily until 2008 then sporadic use since then  . Tobacco abuse   . Thyroid nodule     FNA in [id number] showed follicular cells but not definate neoplasm  . Coronary artery disease     a. 06/2012 NSTEMI/CABG x 3 (LIMA->LAD, VG->OM2, VG->LCX);  b. 04/2015 MV: EF<30%, mid ant, apicalanterior, apical infarct;  c. 04/2015 Cath: LM nl, LAD 90p, LCX 12m OM1 min irregs, RCA mild dzs, LIMA->LAD nl w/ dist LAD dzs, VG->OM2 nl, VG->LCX nl-->Med Rx.  . Arthritis of  left shoulder region 03/23/2013  . Chronic combined systolic and diastolic CHF (congestive heart failure) (HCC)     a. EF 40-45%, mild LVH, mid apicalanteroseptal and apical HK.  .Marland KitchenPVD (peripheral vascular disease) ([employer]     a. 06/2012 ABI's: R - 0.73, L - 0.71.  .Marland KitchenCKD (chronic kidney disease), stage III     Past Surgical History  Procedure Laterality Date  . Coronary artery bypass graft  07/09/2012    Procedure: CORONARY ARTERY BYPASS GRAFTING (CABG);  Surgeon: PIvin Poot MD;  [facility]  Service: Open Heart Surgery;  Laterality: N/A;  . Cardiac catheterization    . Sternal wound debridement  08/17/2012    Procedure: STERNAL WOUND DEBRIDEMENT;  Surgeon: PIvin Poot MD;  [facility]  Service: Thoracic;  Laterality: N/A;  wound vac application  . Sternal wound debridement  08/24/2012    Procedure: STERNAL WOUND DEBRIDEMENT;  Surgeon: PIvin Poot MD;  [facility]  Service: Thoracic;  Laterality: N/A;  . Sternal wound debridement  09/01/2012    Procedure: STERNAL WOUND DEBRIDEMENT;  Surgeon: PIvin Poot MD;  [facility]  Service: Thoracic;  Laterality: N/A;  . Sternal wound debridement  09/20/2012    Procedure: STERNAL WOUND DEBRIDEMENT;  Surgeon: PIvin Poot MD;  [facility];  Service: Thoracic;  Laterality: N/A;  wound vac change  . Left heart catheterization  with coronary angiogram N/A 06/29/2012    Procedure: LEFT HEART CATHETERIZATION WITH CORONARY ANGIOGRAM;  Surgeon: Peter M Martinique, MD;  Location[facility] C[facility] LAB;  Service: Cardiovascular;  Laterality: N/A;  . Cardiac catheterization N/A 05/17/2015    Procedure: Left Heart Cath and Cors/Grafts Angiography;  Surgeon: [name], MD;  Location: [facility] CV LAB;  Service: Cardiovascular;  Laterality: N/A;    Family History  Problem Relation Age of Onset  . Other      no known family CAD  . Diabetes Mother   . Hypertension Mother   . Hyperlipidemia Father     Social History:  reports that  she has been smoking.  She does not have any smokeless tobacco history on file. She reports that she uses illicit drugs (Cocaine). She reports that she does not drink alcohol.  Allergies:  Allergies  Allergen Reactions  . Naproxen     rash    Medications:  I have reviewed the patient's current medications. Prior to Admission:  (Not in a hospital admission) Scheduled: . heparin  4,000 Units Intravenous Once    Results for orders placed or performed during the hospital encounter of 01/11/16 (from the past 48 hour(s))  APTT     Status: None   Collection Time: 01/11/16  2:25 AM  Result Value Ref Range   aPTT 27 24 - 37 seconds  CBC     Status: Abnormal   Collection Time: 01/11/16  2:25 AM  Result Value Ref Range   WBC 12.4 (H) 4.0 - 10.5 K/uL   RBC 4.55 3.87 - 5.11 MIL/uL   Hemoglobin 9.3 (L) 12.0 - 15.0 g/dL   HCT 32.2 (L) 36.0 - 46.0 %   MCV 70.8 (L) 78.0 - 100.0 fL   MCH 20.4 (L) 26.0 - 34.0 pg   MCHC 28.9 (L) 30.0 - 36.0 g/dL   RDW 19.8 (H) 11.5 - 15.5 %   Platelets 462 (H) 150 - 400 K/uL  Comprehensive metabolic panel     Status: Abnormal   Collection Time: 01/11/16  2:25 AM  Result Value Ref Range   Sodium 144 135 - 145 mmol/L   Potassium 3.2 (L) 3.5 - 5.1 mmol/L   Chloride 107 101 - 111 mmol/L   CO2 22 22 - 32 mmol/L   Glucose, Bld 296 (H) 65 - 99 mg/dL   BUN 20 6 - 20 mg/dL   Creatinine, Ser 1.77 (H) 0.44 - 1.00 mg/dL   Calcium 9.2 8.9 - 10.3 mg/dL   Total Protein 7.8 6.5 - 8.1 g/dL   Albumin 3.3 (L) 3.5 - 5.0 g/dL   AST 16 15 - 41 U/L   ALT 9 (L) 14 - 54 U/L   Alkaline Phosphatase 111 38 - 126 U/L   Total Bilirubin 0.8 0.3 - 1.2 mg/dL   GFR calc non Af Amer 29 (L) >60 mL/min   GFR calc Af Amer 34 (L) >60 mL/min    Comment: (NOTE) The eGFR has been calculated using the CKD EPI equation. This calculation has not been validated in all clinical situations. eGFR's persistently <60 mL/min signify possible Chronic Kidney Disease.    Anion gap 15 5 - 15   Protime-INR     Status: Abnormal   Collection Time: 01/11/16  2:25 AM  Result Value Ref Range   Prothrombin Time 15.3 (H) 11.6 - 15.2 seconds   INR 1.19 0.00 - 1.49  I-Stat Troponin, ED (not at [facility], [facility])     Status: None  Collection Time: 01/11/16  2:27 AM  Result Value Ref Range   Troponin i, poc 0.03 0.00 - 0.08 ng/mL   Comment 3            Comment: Due to the release kinetics of cTnI, a negative result within the first hours of the onset of symptoms does not rule out myocardial infarction with certainty. If myocardial infarction is still suspected, repeat the test at appropriate intervals.     Dg Chest Portable 1 View  01/11/2016  CLINICAL DATA:  Initial evaluation for acute shortness of breath. EXAM: PORTABLE CHEST 1 VIEW COMPARISON:  Prior study from 05/12/2015. FINDINGS: Median sternotomy wires underlying CABG markers again noted, stable. Moderate cardiomegaly is present. Mediastinal silhouette within normal limits. Lungs are normally inflated. Defibrillator pad overlies the left chest. Moderate diffuse pulmonary edema present. No significant pleural effusion on this single frontal projection. No definite focal infiltrates. No pneumothorax. No acute osseous abnormality. IMPRESSION: Cardiomegaly with moderate diffuse pulmonary edema, consistent with acute CHF exacerbation. Electronically Signed   By: Jeannine Boga M.D.   On: 01/11/2016 03:18    Review of Systems  Constitutional: Negative for fever, chills and diaphoresis.  HENT: Positive for hearing loss. Negative for ear discharge and ear pain.   Eyes: Negative for double vision and photophobia.  Respiratory: Positive for shortness of breath. Negative for hemoptysis and sputum production.   Cardiovascular: Positive for chest pain.       Denies current chest pain  Gastrointestinal: Negative for nausea, vomiting and abdominal pain.  Genitourinary: Negative for dysuria and hematuria.  Musculoskeletal: Negative for  myalgias and neck pain.  Skin: Negative for rash.  Neurological: Negative for tingling, tremors, sensory change and headaches.  Endo/Heme/Allergies: Negative for environmental allergies and polydipsia. Bruises/bleeds easily.  Psychiatric/Behavioral: Positive for substance abuse. Negative for suicidal ideas and hallucinations.   Blood pressure 157/68, pulse 110, temperature 98.1 F (36.7 C), temperature source Oral, resp. rate 21, height 5' 8.5" (1.74 m), weight 124.2 kg (273 lb 13 oz), SpO2 100 %. Physical Exam  Nursing note and vitals reviewed. Constitutional: She is oriented to person, place, and time. She appears well-developed and well-nourished. She appears distressed.  Appears older than stated age  HENT:  Head: Normocephalic and atraumatic.  Nose: Nose normal.  Mouth/Throat: No oropharyngeal exudate.  Eyes: EOM are normal. Pupils are equal, round, and reactive to light. No scleral icterus.  Neck: Normal range of motion. Neck supple. JVD present.  JVP 3/4 up neck at 45 degrees  Cardiovascular: Regular rhythm, normal heart sounds and intact distal pulses.  Exam reveals no gallop.   No murmur heard. Tachycardic, regular  Respiratory: She is in respiratory distress. She has no wheezes. She has rales.  Bibasilar crackles  GI: Soft. Bowel sounds are normal. She exhibits no distension. There is no tenderness. There is no rebound.  Musculoskeletal: Normal range of motion. She exhibits no edema or tenderness.  Neurological: She is alert and oriented to person, place, and time. No cranial nerve deficit. Coordination normal.  Skin: Skin is warm and dry. No rash noted. She is not diaphoretic. No erythema. No pallor.  Psychiatric:  Depressed affect   Labs reviewed (available) troponin 0.03, na 144, K 3.2, bun/cr 20/1.77, albumin 3.3, glucose 296 EKG: sinus tachycardia, LVH with repolarization Chest x-ray: bilateral pulmonary edema Echo 5/16: EF 40-45%, WMA, mild LVH Assessment/Plan: Ms.  Sprowl is a 65 yo woman with PMH of T2DM, CVA, CAD with previous CABG (July 2013), dyslipidemia, peripheral vascular  disease, cocaine abuse, chronic kidney disease and last admission for heart failure in May of 2016 who presented to the ER with acute dyspnea. She has no current chest pain and ECG not consistent with STEMI. Differential etiology for her dyspnea includes infection, hypertension/flash pulmonary edema, cocaine/other substance, volume overload/heart failure among other etiologies. I favor multifactorial etiology with large component related to hypertension and likely dietary noncompliance and polysubstance abuse. For now, agree with IV NTG, IV lasix and evaluation for other medical issues. Admission to medicine given multiple ongoing medical problems  Problem List Flash Pulmonary Edema Hypertension/Hypertensive Urgency Chronic Kidney Disease Stage III CAD s/p previous CABG Sinus Tachycardia Dyslipidemia Polysubstance abuse T2DM  Plan 1. Telemetry, trend cardiac markers 2. IV NTG for bp and flash pulmonary edema 3. IV lasix 40 mg bid 4. Watch metoprolol for possible unopposed alpha in setting of drug use; can use carvedilol instead with alpha and beta-blockage for blood pressure/HR 5. Ok to continue home hydralazine and hold imdur while on IV NTG 6. Continue home pravastatin 7. Evaluate for any infectious etiologies 8. Hyperglycemia per IM team (appreciate assistance greatly) 9. Substance abuse cessation counseling daily  KELLY, JACOB 01/11/2016, 4:46 AM

## 2016-01-12 DIAGNOSIS — I2510 Atherosclerotic heart disease of native coronary artery without angina pectoris: Secondary | ICD-10-CM

## 2016-01-12 DIAGNOSIS — N183 Chronic kidney disease, stage 3 (moderate): Secondary | ICD-10-CM

## 2016-01-12 DIAGNOSIS — F1410 Cocaine abuse, uncomplicated: Secondary | ICD-10-CM

## 2016-01-12 DIAGNOSIS — N189 Chronic kidney disease, unspecified: Secondary | ICD-10-CM

## 2016-01-12 DIAGNOSIS — N179 Acute kidney failure, unspecified: Secondary | ICD-10-CM | POA: Diagnosis present

## 2016-01-12 LAB — GLUCOSE, CAPILLARY
GLUCOSE-CAPILLARY: 125 mg/dL — AB (ref 65–99)
GLUCOSE-CAPILLARY: 124 mg/dL — AB (ref 65–99)
Glucose-Capillary: 177 mg/dL — ABNORMAL HIGH (ref 65–99)
Glucose-Capillary: 157 mg/dL — ABNORMAL HIGH (ref 65–99)
Glucose-Capillary: 132 mg/dL — ABNORMAL HIGH (ref 65–99)

## 2016-01-12 LAB — CBC
HEMATOCRIT: 26.9 % — AB (ref 36.0–46.0)
HEMOGLOBIN: 8.1 g/dL — AB (ref 12.0–15.0)
MCH: 21.1 pg — AB (ref 26.0–34.0)
MCHC: 30.1 g/dL (ref 30.0–36.0)
MCV: 70.1 fL — ABNORMAL LOW (ref 78.0–100.0)
PLATELETS: 356 10*3/uL (ref 150–400)
RBC: 3.84 MIL/uL — AB (ref 3.87–5.11)
RDW: 19.6 % — ABNORMAL HIGH (ref 11.5–15.5)
WBC: 9 10*3/uL (ref 4.0–10.5)

## 2016-01-12 LAB — BASIC METABOLIC PANEL
ANION GAP: 10 (ref 5–15)
BUN: 25 mg/dL — ABNORMAL HIGH (ref 6–20)
CALCIUM: 8.7 mg/dL — AB (ref 8.9–10.3)
CO2: 25 mmol/L (ref 22–32)
CREATININE: 1.97 mg/dL — AB (ref 0.44–1.00)
Chloride: 107 mmol/L (ref 101–111)
GFR, EST AFRICAN AMERICAN: 30 mL/min — AB (ref >60)
GFR, EST NON AFRICAN AMERICAN: 25 mL/min — AB (ref >60)
Glucose, Bld: 119 mg/dL — ABNORMAL HIGH (ref 65–99)
Potassium: 3.3 mmol/L — ABNORMAL LOW (ref 3.5–5.1)
SODIUM: 142 mmol/L (ref 135–145)

## 2016-01-12 LAB — HEMOGLOBIN A1C
Hgb A1c MFr Bld: 6 % — ABNORMAL HIGH (ref 4.8–5.6)
Mean Plasma Glucose: 126 mg/dL

## 2016-01-12 LAB — RAPID URINE DRUG SCREEN, HOSP PERFORMED
AMPHETAMINES: NOT DETECTED
Barbiturates: NOT DETECTED
Benzodiazepines: NOT DETECTED
COCAINE: NOT DETECTED
OPIATES: NOT DETECTED
TETRAHYDROCANNABINOL: NOT DETECTED

## 2016-01-12 LAB — TROPONIN I: Troponin I: 1.05 ng/mL (ref <0.031)

## 2016-01-12 LAB — HEPARIN LEVEL (UNFRACTIONATED)
Heparin Unfractionated: 0.31 IU/mL (ref 0.30–0.70)
Heparin Unfractionated: 0.42 IU/mL (ref 0.30–0.70)

## 2016-01-12 LAB — MAGNESIUM: Magnesium: 2 mg/dL (ref 1.7–2.4)

## 2016-01-12 LAB — POTASSIUM: POTASSIUM: 4.4 mmol/L (ref 3.5–5.1)

## 2016-01-12 MED ORDER — INSULIN ASPART 100 UNIT/ML ~~LOC~~ SOLN
0.0000 [IU] | Freq: Three times a day (TID) | SUBCUTANEOUS | Status: DC
  Administered 2016-01-12: 3 [IU] via SUBCUTANEOUS
  Administered 2016-01-13: 2 [IU] via SUBCUTANEOUS
  Administered 2016-01-13: 3 [IU] via SUBCUTANEOUS
  Administered 2016-01-13: 2 [IU] via SUBCUTANEOUS
  Administered 2016-01-14 (×2): 3 [IU] via SUBCUTANEOUS
  Administered 2016-01-15: 2 [IU] via SUBCUTANEOUS
  Administered 2016-01-15 – 2016-01-16 (×4): 3 [IU] via SUBCUTANEOUS

## 2016-01-12 MED ORDER — FUROSEMIDE 10 MG/ML IJ SOLN: 40.0000 mg | Freq: Two times a day (BID) | INTRAMUSCULAR | Status: DC

## 2016-01-12 MED ORDER — POTASSIUM CHLORIDE CRYS ER 20 MEQ PO TBCR
40.0000 meq | EXTENDED_RELEASE_TABLET | Freq: Two times a day (BID) | ORAL | Status: DC
  Administered 2016-01-12 – 2016-01-14 (×6): 40 meq via ORAL
  Filled 2016-01-12 (×7): qty 2

## 2016-01-12 MED ORDER — POTASSIUM CHLORIDE CRYS ER 20 MEQ PO TBCR
40.0000 meq | EXTENDED_RELEASE_TABLET | Freq: Once | ORAL | Status: AC
  Administered 2016-01-12: 40 meq via ORAL
  Filled 2016-01-12: qty 2

## 2016-01-12 MED ORDER — FUROSEMIDE 10 MG/ML IJ SOLN
60.0000 mg | Freq: Three times a day (TID) | INTRAMUSCULAR | Status: DC
  Administered 2016-01-12 – 2016-01-13 (×5): 60 mg via INTRAVENOUS
  Filled 2016-01-12 (×5): qty 6

## 2016-01-12 NOTE — Progress Notes (Signed)
[facility] TEAM 1 - Stepdown/ICU TEAM Progress Note  [name] [name] [id number] DOB: Sep 20, 1951 DOA: 01/11/2016 PCP: Marlou Sa ERIC, MD  Admit HPI / Brief Narrative: 65 y.o. BF PMHx DM Type 2, CAD native artery S/P CABG, SUBSTANCE ABUSE (cocaine abuse), Chronic Systolic and Diastolic CHF, HTN, HLD, CKD stage III, Thyroid Nodule.  Patient presents to the ED with acute onset gradually worsening SOB that onset 2 hours ago. Patient used cocaine on Monday for the first time in 6 months she states. She claims no chest pain after use until today. Today she developed SOB, and called EMS. She had brief episode of chest pain that lead to consultation with cardiology, NTG, and heparin being started on the patient. She is currently chest pain free. Her SOB has improved with lasix, albuterol, NTG, and Atrovent.   HPI/Subjective: 1/14 A/O 4, NAD, negative CP, negative SOB. Sitting in chair comfortably  Assessment/Plan: Acute on chronic combined CHF - -echocardiogram; see results below -Strict I&O; since admission - +1.2 L -Daily weight admission weight 126.6 kg          1/14 bed weight= 130 kg -Imdur 30 mg daily -Metoprolol 25 mgBID -Increase Lasix IV to 60 mg TID; fluid restrict to 1.2 L  Pulmonary hypertension   Chest pain/SOB -Secondary to cocaine use and CHF -see CHF  HTN  -see CHF  Hypokalemia -Potassium goal > 4  -K Dur 40 mEq  Elevated troponin/Demand ischemia -Patient evaluated by cardiology medical management -Troponins trending up but cardiology following  CKD stage III (baseline ~Cr 1.6-1.8) -current Cr slightly above baseline  DM Type 2 uncontrolled -05/12/2015 hemoglobin A1c= 7.2 -Lantus 10 units daily -Increase to moderate SSI  Substance abuse -Consulted CSW for resources to help with drug abuse  Tobacco abuse -Patient states would like to discontinue smoking. Agreed to Wellbutrin 150 mg daily -After 2 days increase to 150 mg BID      Code Status:  FULL Family Communication: no family present at time of exam Disposition Plan: SNF    Consultants: Dr.Peter M Martinique cardiology   Procedure/Significant Events: 1/13 echocardiogram; - Left ventricle: moderate LVH. -LVEF=35% to 40%. No mural thrombus identified with contrast.Diffuse hypokinesis. -(grade 2 diastolic dysfunction). - Left atrium: Moderately dilated - Inferior vena cava: The vessel was dilated. The respirophasicdiameter changes were blunted (< 50%), c/w elevated CVP     Culture NA  Antibiotics: NA  DVT prophylaxis: Heparin drip   Devices NA   LINES / TUBES:   NA   Continuous Infusions: . sodium chloride Stopped (01/12/16 1029)    Objective: VITAL SIGNS: Temp: 99.1 F (37.3 C) (01/14 1550) Temp Source: Oral (01/14 1550) BP: 113/63 mmHg (01/14 1550) Pulse Rate: 70 (01/14 1550) SPO2; FIO2:   Intake/Output Summary (Last 24 hours) at 01/12/16 1755 Last data filed at 01/12/16 1700  Gross per 24 hour  Intake 2980.33 ml  Output    700 ml  Net 2280.33 ml     Exam: General: A/O 4, NAD, No acute respiratory distress Eyes: Negative headache, eye pain, double vision,negative scleral hemorrhage ENT: Negative Runny nose, negative ear pain, negative gingival bleeding, Neck:  Negative scars, masses, torticollis, lymphadenopathy, JVD Lungs: Clear to auscultation bilaterally without wheezes or crackles Cardiovascular: Regular rate and rhythm without murmur gallop or rub normal S1 and S2 Abdomen:negative abdominal pain, nondistended, positive soft, bowel sounds, no rebound, no ascites, no appreciable mass Extremities: No significant cyanosis, clubbing, or edema bilateral lower extremities Psychiatric:  Negative depression, negative anxiety, negative fatigue,  negative mania  Neurologic:  Cranial nerves II through XII intact, tongue/uvula midline, all extremities muscle strength 5/5, sensation intact throughout, negative dysarthria, negative expressive  aphasia, negative receptive aphasia.   Data Reviewed: Basic Metabolic Panel:  Recent Labs Lab 01/11/16 0225 01/12/16 0319  NA 144 142  K 3.2* 3.3*  CL 107 107  CO2 22 25  GLUCOSE 296* 119*  BUN 20 25*  CREATININE 1.77* 1.97*  CALCIUM 9.2 8.7*   Liver Function Tests:  Recent Labs Lab 01/11/16 0225  AST 16  ALT 9*  ALKPHOS 111  BILITOT 0.8  PROT 7.8  ALBUMIN 3.3*   No results for input(s): LIPASE, AMYLASE in the last 168 hours. No results for input(s): AMMONIA in the last 168 hours. CBC:  Recent Labs Lab 01/11/16 0225 01/12/16 0319  WBC 12.4* 9.0  HGB 9.3* 8.1*  HCT 32.2* 26.9*  MCV 70.8* 70.1*  PLT 462* 356   Cardiac Enzymes:  Recent Labs Lab 01/11/16 0645 01/11/16 0922 01/11/16 1500  TROPONINI 0.35* 1.02* 2.35*   BNP (last 3 results)  Recent Labs  05/12/15 0404 05/14/15 0312 01/11/16 1139  BNP 439.6* 471.1* 1097.7*    ProBNP (last 3 results) No results for input(s): PROBNP in the last 8760 hours.  CBG:  Recent Labs Lab 01/11/16 2259 01/12/16 0309 01/12/16 0850 01/12/16 1215 01/12/16 1725  GLUCAP 187* 125* 124* 177* 157*    Recent Results (from the past 240 hour(s))  MRSA PCR Screening     Status: None   Collection Time: 01/11/16  6:58 PM  Result Value Ref Range Status   MRSA by PCR NEGATIVE NEGATIVE Final    Comment:        The GeneXpert MRSA Assay (FDA approved for NASAL specimens only), is one component of a comprehensive MRSA colonization surveillance program. It is not intended to diagnose MRSA infection nor to guide or monitor treatment for MRSA infections.      Studies:  Recent x-ray studies have been reviewed in detail by the Attending Physician  Scheduled Meds:  Scheduled Meds: . aspirin EC  81 mg Oral Daily  . brinzolamide  1 drop Both Eyes TID  . buPROPion  150 mg Oral Daily  . furosemide  60 mg Intravenous TID  . gabapentin  300 mg Oral QHS  . hydrALAZINE  50 mg Oral 3 times per day  . insulin  aspart  0-15 Units Subcutaneous TID WC  . insulin detemir  10 Units Subcutaneous QHS  . isosorbide mononitrate  30 mg Oral Daily  . latanoprost  1 drop Both Eyes QHS  . loratadine  10 mg Oral Daily  . mometasone-formoterol  2 puff Inhalation BID  . potassium chloride  40 mEq Oral BID  . pravastatin  40 mg Oral QHS  . traMADol  50 mg Oral TID    Time spent on care of this patient: 40 mins   Julene Rahn, Geraldo Docker , MD  [facility] Office  [phone] Pager [phone]  On-Call/Text Page:      [email or web address]      password [email or web address]  If 7PM-7AM, please contact night-coverage [email or web address] Password [email or web address] 01/12/2016, 5:55 PM   LOS: 1 day   Care during the described time interval was provided by me .  I have reviewed this patient's available data, including medical history, events of note, physical examination, and all test results as part of my evaluation. I have personally reviewed and interpreted all radiology studies.   Dia Crawford, MD [phone] Pager

## 2016-01-12 NOTE — Progress Notes (Signed)
ANTICOAGULATION CONSULT NOTE - Follow-up Consult  Pharmacy Consult for Heparin Indication: chest pain/ACS  Allergies  Allergen Reactions  . Naproxen Rash    Patient Measurements: Height: 5' 8.5" (174 cm) Weight: 279 lb 1.6 oz (126.6 kg) IBW/kg (Calculated) : 65.05 Heparin Dosing Weight: 92 kg  Vital Signs: Temp: 98.3 F (36.8 C) (01/13 2255) Temp Source: Oral (01/13 2255) BP: 156/81 mmHg (01/13 2255) Pulse Rate: 103 (01/13 2255)  Labs:  Recent Labs  01/11/16 0225 01/11/16 0645 01/11/16 0922 01/11/16 1500 01/11/16 2338  HGB 9.3*  --   --   --   --   HCT 32.2*  --   --   --   --   PLT 462*  --   --   --   --   APTT 27  --   --   --   --   LABPROT 15.3*  --   --   --   --   INR 1.19  --   --   --   --   HEPARINUNFRC  --   --   --  0.21* 0.31  CREATININE 1.77*  --   --   --   --   TROPONINI  --  0.35* 1.02* 2.35*  --     Estimated Creatinine Clearance: 44.9 mL/min (by C-G formula based on Cr of 1.77).   Assessment: 65 y.o. F on heparin for r/o ACS. Trop up to 2.35. Cards suspects troponin elevation related to pulmonary edema and not ACS but would like to continue heparin gtt for now. Heparin level now therapeutic (0.31) on 1600 units/hr. No bleeding noted.  Goal of Therapy:  Heparin level 0.3-0.7 units/ml Monitor platelets by anticoagulation protocol: Yes   Plan:  Continue heparin gtt at 1600 units/hr F/u 6 hr confirmatory heparin level  Sherlon Handing, PharmD, BCPS Clinical pharmacist, pager [phone] 01/12/2016 1:48 AM    01/12/2016 1:46 AM

## 2016-01-12 NOTE — Progress Notes (Signed)
Patient watched Heart Failure video (#102) on education channel.

## 2016-01-12 NOTE — Progress Notes (Signed)
TELEMETRY: Reviewed telemetry pt in NSR with frequent PACs: Filed Vitals:   01/12/16 0309 01/12/16 0500 01/12/16 0719 01/12/16 1021  BP: 139/74 146/76  137/74  Pulse: 88   95  Temp: 98.1 F (36.7 C)  98.7 F (37.1 C)   TempSrc: Oral  Oral   Resp: 22     Height:      Weight: 130 kg (286 lb 9.6 oz)     SpO2: 97%       Intake/Output Summary (Last 24 hours) at 01/12/16 1027 Last data filed at 01/12/16 0600  Gross per 24 hour  Intake 1270.03 ml  Output    600 ml  Net 670.03 ml   Filed Weights   01/11/16 0345 01/11/16 1418 01/12/16 0309  Weight: 124.2 kg (273 lb 13 oz) 126.6 kg (279 lb 1.6 oz) 130 kg (286 lb 9.6 oz)    Subjective Still feels SOB. Complains of pain in mid left posterior back. No chest pain.   Marland Kitchen aspirin EC  81 mg Oral Daily  . brinzolamide  1 drop Both Eyes TID  . buPROPion  150 mg Oral Daily  . furosemide  40 mg Intravenous Q12H  . gabapentin  300 mg Oral QHS  . hydrALAZINE  50 mg Oral 3 times per day  . insulin aspart  0-15 Units Subcutaneous 6 times per day  . insulin detemir  10 Units Subcutaneous QHS  . isosorbide mononitrate  30 mg Oral Daily  . latanoprost  1 drop Both Eyes QHS  . loratadine  10 mg Oral Daily  . mometasone-formoterol  2 puff Inhalation BID  . pravastatin  40 mg Oral QHS  . traMADol  50 mg Oral TID   . sodium chloride 10 mL/hr (01/11/16 0236)    LABS: Basic Metabolic Panel:  Recent Labs  01/11/16 0225 01/12/16 0319  NA 144 142  K 3.2* 3.3*  CL 107 107  CO2 22 25  GLUCOSE 296* 119*  BUN 20 25*  CREATININE 1.77* 1.97*  CALCIUM 9.2 8.7*   Liver Function Tests:  Recent Labs  01/11/16 0225  AST 16  ALT 9*  ALKPHOS 111  BILITOT 0.8  PROT 7.8  ALBUMIN 3.3*   No results for input(s): LIPASE, AMYLASE in the last 72 hours. CBC:  Recent Labs  01/11/16 0225 01/12/16 0319  WBC 12.4* 9.0  HGB 9.3* 8.1*  HCT 32.2* 26.9*  MCV 70.8* 70.1*  PLT 462* 356   Cardiac Enzymes:  Recent Labs  01/11/16 0645  01/11/16 0922 01/11/16 1500  TROPONINI 0.35* 1.02* 2.35*   BNP: No results for input(s): PROBNP in the last 72 hours. D-Dimer: No results for input(s): DDIMER in the last 72 hours. Hemoglobin A1C:  Recent Labs  01/11/16 1139  HGBA1C 6.0*   Fasting Lipid Panel:  Recent Labs  01/11/16 1138  CHOL 120  HDL 25*  LDLCALC 80  TRIG 76  CHOLHDL 4.8   Thyroid Function Tests: No results for input(s): TSH, T4TOTAL, T3FREE, THYROIDAB in the last 72 hours.  Invalid input(s): FREET3   Radiology/Studies:  Dg Chest Portable 1 View  01/11/2016  CLINICAL DATA:  Initial evaluation for acute shortness of breath. EXAM: PORTABLE CHEST 1 VIEW COMPARISON:  Prior study from 05/12/2015. FINDINGS: Median sternotomy wires underlying CABG markers again noted, stable. Moderate cardiomegaly is present. Mediastinal silhouette within normal limits. Lungs are normally inflated. Defibrillator pad overlies the left chest. Moderate diffuse pulmonary edema present. No significant pleural effusion on this single frontal projection. No  definite focal infiltrates. No pneumothorax. No acute osseous abnormality. IMPRESSION: Cardiomegaly with moderate diffuse pulmonary edema, consistent with acute CHF exacerbation. Electronically Signed   By: Jeannine Boga M.D.   On: 01/11/2016 03:18   Ecg 01/11/16 shows sinus tachy with old anterior infarct. LVH.  Echo: Study Conclusions  - Left ventricle: The cavity size was normal. Wall thickness was increased in a pattern of moderate LVH. Systolic function was moderately reduced. The estimated ejection fraction was in the range of 35% to 40%. No mural thrombus identified with contrast. Diffuse hypokinesis. Doppler parameters are consistent with pseudonormal left ventricular relaxation (grade 2 diastolic dysfunction). The E/A ratio is >2. The E/e&' ratio is >15, suggesting elevated LV filling pressure. - Mitral valve: Mildly thickened leaflets . There was  mild regurgitation. - Left atrium: Moderately dilated at 43 ml/m2. - Right ventricle: The cavity size was normal. Wall thickness was normal. Systolic function is reduced. - Inferior vena cava: The vessel was dilated. The respirophasic diameter changes were blunted (< 50%), consistent with elevated central venous pressure.  Impressions:  - Compared with a prior study in 04/2015, the EF is slightly worse at 35-40%. Filling pressures are elevated.  PHYSICAL EXAM General: Well developed, obese, in no acute distress. Head: Normocephalic, atraumatic, sclera non-icteric, oropharynx is clear Neck: Negative for carotid bruits. JVD not elevated. No adenopathy Lungs: Bibasilar rales with diffuse rhonchi.  Breathing is unlabored. Heart: RRR S1 S2 without murmurs, rubs, or gallops.  Abdomen: Soft, non-tender, non-distended with normoactive bowel sounds. No hepatomegaly. No rebound/guarding. No obvious abdominal masses. Msk:  Strength and tone appears normal for age. Extremities: No clubbing, cyanosis or edema.  Distal pedal pulses are 2+ and equal bilaterally. Neuro: Alert and oriented X 3. Moves all extremities spontaneously. Psych:  Responds to questions appropriately with a normal affect.  ASSESSMENT AND PLAN: 1. Acute on chronic systolic CHF. Poor dietary habits with high salt intake a factor. Also recent use of cocaine. Need to avoid beta blocker in setting of cocaine use. Metoprolol stopped. Not a candidate for ACEi/ARB now due to ARF. On nitrates and hydralazine. I/O negative only 355 cc. Needs IV lasix. Only getting 40 mg po daily now. Will start 40 mg IV bid.  2. Elevated troponin. I think this is related to demand ischemia in setting of acute CHF and recent cocaine use. Cardiac cath in May 2016 with similar presentation showed patent grafts. I would continue medical therapy without further ischemic work up since she does not have angina. Echo shows global LV dysfunction. Will DC IV  heparin particularly with dropping Hgb.  3. Cocaine abuse.  4. HTN- BP improved.  5. HL on pravastatin. 6. Hypokalemia. Replete orally.   Present on Admission:  . (Resolved) Chest pain . (Resolved) Diarrhea . Acute on chronic combined systolic and diastolic congestive heart failure ([place]) . Cocaine abuse . Coronary artery disease . CKD (chronic kidney disease), stage III . CAD in native artery . Uncontrolled type 2 diabetes mellitus with hyperosmolarity without coma ([place]) . Substance abuse  Signed, Peter Martin[place], [facility] 01/12/2016 10:27 AM

## 2016-01-13 ENCOUNTER — Inpatient Hospital Stay (HOSPITAL_COMMUNITY): Payer: Medicare Other

## 2016-01-13 LAB — BASIC METABOLIC PANEL
Anion gap: 11 (ref 5–15)
BUN: 33 mg/dL — AB (ref 6–20)
CHLORIDE: 104 mmol/L (ref 101–111)
CO2: 24 mmol/L (ref 22–32)
CREATININE: 2.13 mg/dL — AB (ref 0.44–1.00)
Calcium: 9 mg/dL (ref 8.9–10.3)
GFR calc Af Amer: 27 mL/min — ABNORMAL LOW (ref >60)
GFR calc non Af Amer: 23 mL/min — ABNORMAL LOW (ref >60)
GLUCOSE: 166 mg/dL — AB (ref 65–99)
POTASSIUM: 4.2 mmol/L (ref 3.5–5.1)
Sodium: 139 mmol/L (ref 135–145)

## 2016-01-13 LAB — CBC
HCT: 24.7 % — ABNORMAL LOW (ref 36.0–46.0)
HEMOGLOBIN: 7.5 g/dL — AB (ref 12.0–15.0)
MCH: 21.1 pg — AB (ref 26.0–34.0)
MCHC: 30.4 g/dL (ref 30.0–36.0)
MCV: 69.6 fL — AB (ref 78.0–100.0)
PLATELETS: 304 10*3/uL (ref 150–400)
RBC: 3.55 MIL/uL — AB (ref 3.87–5.11)
RDW: 19.8 % — ABNORMAL HIGH (ref 11.5–15.5)
WBC: 7.2 10*3/uL (ref 4.0–10.5)

## 2016-01-13 LAB — GLUCOSE, CAPILLARY
GLUCOSE-CAPILLARY: 143 mg/dL — AB (ref 65–99)
Glucose-Capillary: 149 mg/dL — ABNORMAL HIGH (ref 65–99)
Glucose-Capillary: 179 mg/dL — ABNORMAL HIGH (ref 65–99)
Glucose-Capillary: 137 mg/dL — ABNORMAL HIGH (ref 65–99)

## 2016-01-13 LAB — TROPONIN I
Troponin I: 0.85 ng/mL (ref <0.031)
Troponin I: 0.89 ng/mL

## 2016-01-13 LAB — MAGNESIUM: Magnesium: 2 mg/dL (ref 1.7–2.4)

## 2016-01-13 MED ORDER — BUPROPION HCL ER (SR) 150 MG PO TB12
150.0000 mg | ORAL_TABLET | Freq: Two times a day (BID) | ORAL | Status: DC
  Administered 2016-01-13 – 2016-01-16 (×6): 150 mg via ORAL
  Filled 2016-01-13 (×7): qty 1

## 2016-01-13 NOTE — Progress Notes (Signed)
TELEMETRY: Reviewed telemetry pt in NSR/sinus tachy  Filed Vitals:   01/13/16 0324 01/13/16 0704 01/13/16 0826 01/13/16 0830  BP: 136/68 106/63    Pulse: 94 95    Temp: 99.1 F (37.3 C)  98.9 F (37.2 C)   TempSrc: Oral     Resp: 15 15    Height:      Weight:      SpO2: 100% 97%  97%    Intake/Output Summary (Last 24 hours) at 01/13/16 1007 Last data filed at 01/13/16 0900  Gross per 24 hour  Intake 1290.3 ml  Output   2650 ml  Net -1359.7 ml   Filed Weights   01/11/16 1418 01/12/16 0309 01/13/16 0226  Weight: 126.6 kg (279 lb 1.6 oz) 130 kg (286 lb 9.6 oz) 130.4 kg (287 lb 7.7 oz)    Subjective States breathing is better but still SOB.  No chest pain.   Marland Kitchen aspirin EC  81 mg Oral Daily  . brinzolamide  1 drop Both Eyes TID  . buPROPion  150 mg Oral Daily  . furosemide  60 mg Intravenous TID  . gabapentin  300 mg Oral QHS  . hydrALAZINE  50 mg Oral 3 times per day  . insulin aspart  0-15 Units Subcutaneous TID WC  . insulin detemir  10 Units Subcutaneous QHS  . isosorbide mononitrate  30 mg Oral Daily  . latanoprost  1 drop Both Eyes QHS  . loratadine  10 mg Oral Daily  . mometasone-formoterol  2 puff Inhalation BID  . potassium chloride  40 mEq Oral BID  . pravastatin  40 mg Oral QHS  . traMADol  50 mg Oral TID   . sodium chloride Stopped (01/12/16 1029)    LABS: Basic Metabolic Panel:  Recent Labs  01/12/16 0319 01/12/16 1655 01/13/16 0600  NA 142  --  139  K 3.3* 4.4 4.2  CL 107  --  104  CO2 25  --  24  GLUCOSE 119*  --  166*  BUN 25*  --  33*  CREATININE 1.97*  --  2.13*  CALCIUM 8.7*  --  9.0  MG  --  2.0 2.0   Liver Function Tests:  Recent Labs  01/11/16 0225  AST 16  ALT 9*  ALKPHOS 111  BILITOT 0.8  PROT 7.8  ALBUMIN 3.3*   No results for input(s): LIPASE, AMYLASE in the last 72 hours. CBC:  Recent Labs  01/12/16 0319 01/13/16 0600  WBC 9.0 7.2  HGB 8.1* 7.5*  HCT 26.9* 24.7*  MCV 70.1* 69.6*  PLT 356 304   Cardiac  Enzymes:  Recent Labs  01/12/16 1850 01/13/16 0008 01/13/16 0600  TROPONINI 1.05* 0.89* 0.85*   BNP: No results for input(s): PROBNP in the last 72 hours. D-Dimer: No results for input(s): DDIMER in the last 72 hours. Hemoglobin A1C:  Recent Labs  01/11/16 1139  HGBA1C 6.0*   Fasting Lipid Panel:  Recent Labs  01/11/16 1138  CHOL 120  HDL 25*  LDLCALC 80  TRIG 76  CHOLHDL 4.8   Thyroid Function Tests: No results for input(s): TSH, T4TOTAL, T3FREE, THYROIDAB in the last 72 hours.  Invalid input(s): FREET3   Radiology/Studies:  No results found. Ecg 01/11/16 shows sinus tachy with old anterior infarct. LVH.  Echo: Study Conclusions  - Left ventricle: The cavity size was normal. Wall thickness was increased in a pattern of moderate LVH. Systolic function was moderately reduced. The estimated ejection fraction was  in the range of 35% to 40%. No mural thrombus identified with contrast. Diffuse hypokinesis. Doppler parameters are consistent with pseudonormal left ventricular relaxation (grade 2 diastolic dysfunction). The E/A ratio is >2. The E/e&' ratio is >15, suggesting elevated LV filling pressure. - Mitral valve: Mildly thickened leaflets . There was mild regurgitation. - Left atrium: Moderately dilated at 43 ml/m2. - Right ventricle: The cavity size was normal. Wall thickness was normal. Systolic function is reduced. - Inferior vena cava: The vessel was dilated. The respirophasic diameter changes were blunted (< 50%), consistent with elevated central venous pressure.  Impressions:  - Compared with a prior study in 04/2015, the EF is slightly worse at 35-40%. Filling pressures are elevated.   PHYSICAL EXAM General: Well developed, obese, in no acute distress. Head: Normocephalic, atraumatic, sclera non-icteric, oropharynx is clear Neck: Negative for carotid bruits. JVD not elevated. No adenopathy Lungs: mild rhonchi.  Breathing  is unlabored. Heart: RRR S1 S2 without murmurs, rubs, or gallops.  Abdomen: Soft, non-tender, non-distended with normoactive bowel sounds. No hepatomegaly. No rebound/guarding. No obvious abdominal masses. Msk:  Strength and tone appears normal for age. Extremities: No clubbing, cyanosis or edema.  Distal pedal pulses are 2+ and equal bilaterally. Neuro: Alert and oriented X 3. Moves all extremities spontaneously. Psych:  Responds to questions appropriately with a normal affect.  ASSESSMENT AND PLAN: 1. Acute on chronic systolic CHF. Poor dietary habits with high salt intake a factor. Also recent use of cocaine. Need to avoid beta blocker in setting of cocaine use. Metoprolol stopped. Not a candidate for ACEi/ARB now due to ARF. On nitrates and hydralazine. I/O negative only 934 cc since admit. Weight unchanged. Will check CXR today. If edema improved may switch lasix to po. I am not sure that I/O complete and needs to weigh on upright scale.  2. Elevated troponin. I think this is related to demand ischemia in setting of acute CHF and recent cocaine use. Cardiac cath in May 2016 with similar presentation showed patent grafts. I would continue medical therapy without further ischemic work up since she does not have angina. Echo shows global LV dysfunction.  3. Cocaine abuse.  4. HTN- BP improved.  5. HL on pravastatin. 6. Hypokalemia. Replete orally. 7. Severe anemia. No obvious bleeding. Per primary team.   Present on Admission:  . (Resolved) Chest pain . (Resolved) Diarrhea . Acute on chronic combined systolic and diastolic congestive heart failure ([place]) . Cocaine abuse . Coronary artery disease . CKD (chronic kidney disease), stage III . CAD in native artery . Uncontrolled type 2 diabetes mellitus with hyperosmolarity without coma ([place]) . Substance abuse . Acute on chronic renal failure ([place])  Signed, Izic Stfort [place], [place] 01/13/2016 10:07 AM

## 2016-01-13 NOTE — Evaluation (Signed)
Physical Therapy Evaluation Patient Details Name: [name] [name] MRN: [id number] DOB: Dec 22, 1951 Today's Date: 01/13/2016   History of Present Illness  Patient is a 65 yo female admitted 01/11/16 with CHF exacerbation, anemia, elevated troponin (demand ischemia per chart).   PMH:  DM, CAD, CABG, CHF, HTN, CVA, cocaine abuse, obesity  Clinical Impression  Patient presents with problems listed below.  Will benefit from acute PT to maximize functional mobility prior to discharge.  Patient with decreased strength, impacting mobility and gait.  Impulsive at times.  Recommend SNF at discharge for continued therapy.    Follow Up Recommendations SNF;Supervision for mobility/OOB    Equipment Recommendations  Other (comment) (Shower seat)    Recommendations for Other Services       Precautions / Restrictions Precautions Precautions: Fall Precaution Comments: Fatigues quickly and sits abruptly Restrictions Weight Bearing Restrictions: No      Mobility  Bed Mobility Overal bed mobility: Needs Assistance Bed Mobility: Supine to Sit     Supine to sit: Min guard     General bed mobility comments: Assist for safety.  Transfers Overall transfer level: Needs assistance Equipment used: Rolling walker (2 wheeled) Transfers: Sit to/from Stand Sit to Stand: Min assist;+2 safety/equipment         General transfer comment: Verbal cues for hand placement.  Assist to steady to rise to standing.  At end of gait, patient fatigued.  She called Rehab Tech's name and promptly began to sit.  Tech had chair behind patient and she was assisted to sitting in chair.  Ambulation/Gait Ambulation/Gait assistance: Min assist;+2 safety/equipment Ambulation Distance (Feet): 44 Feet Assistive device: Rolling walker (2 wheeled) (Chair behind patient during gait) Gait Pattern/deviations: Step-through pattern;Decreased stride length;Shuffle Gait velocity: decreased Gait velocity interpretation: Below normal  speed for age/gender General Gait Details: Verbal cues for safe use of RW.  Patient with slow unsteady gait.  Patient fatigued quickly - DOE and HR increased to 124.  Chair behind patient during gait for safety.  Patient sat abruptly - assisted by therapist/tech.  Stairs            Wheelchair Mobility    Modified Rankin (Stroke Patients Only)       Balance                                             Pertinent Vitals/Pain Pain Assessment: No/denies pain    Home Living Family/patient expects to be discharged to:: Private residence Living Arrangements: Spouse/significant other Available Help at Discharge: Family;Available 24 hours/day Type of Home: Apartment Home Access: Stairs to enter Entrance Stairs-Rails: Right Entrance Stairs-Number of Steps: 2 flights of stairs Home Layout: One level Home Equipment: Walker - 2 wheels;Cane - single point Additional Comments: Patient reports she needs a shower seat.  Reports she and husband live in second floor apartment with 2 flights of stairs to enter.    Prior Function Level of Independence: Independent with assistive device(s);Needs assistance   Gait / Transfers Assistance Needed: Uses cane or RW for gait, depending on how strong she feels that day.  ADL's / Homemaking Assistance Needed: Assist for bathing, meal prep, and housekeeping.        Hand Dominance        Extremity/Trunk Assessment   Upper Extremity Assessment: Overall WFL for tasks assessed           Lower Extremity  Assessment: Generalized weakness (Numbness in both feet)         Communication   Communication: No difficulties  Cognition Arousal/Alertness: Awake/alert Behavior During Therapy: Impulsive Overall Cognitive Status: Within Functional Limits for tasks assessed                      General Comments      Exercises        Assessment/Plan    PT Assessment Patient needs continued PT services  PT Diagnosis  Difficulty walking;Generalized weakness   PT Problem List Decreased strength;Decreased activity tolerance;Decreased balance;Decreased mobility;Decreased knowledge of use of DME;Decreased safety awareness;Cardiopulmonary status limiting activity;Obesity  PT Treatment Interventions DME instruction;Gait training;Functional mobility training;Therapeutic activities;Therapeutic exercise;Balance training;Patient/family education;Cognitive remediation   PT Goals (Current goals can be found in the Care Plan section) Acute Rehab PT Goals Patient Stated Goal: To get stronger PT Goal Formulation: With patient Time For Goal Achievement: 01/27/16 Potential to Achieve Goals: Good    Frequency Min 3X/week   Barriers to discharge Inaccessible home environment Patient reports 2 flights of stairs to enter home/apartment.    Co-evaluation               End of Session Equipment Utilized During Treatment: Gait belt Activity Tolerance: Patient limited by fatigue Patient left: in chair;with call bell/phone within reach;with chair alarm set Nurse Communication: Mobility status (Fatigues quickly; have chair with patient during gait)         Time: 1420-1437 PT Time Calculation (min) (ACUTE ONLY): 17 min   Charges:   PT Evaluation $PT Eval High Complexity: 1 Procedure     PT G CodesDespina Pole Jan 20, 2016, 7:14 PM Carita Pian. Sanjuana Kava, [place] Pager [phone]

## 2016-01-13 NOTE — Progress Notes (Signed)
[facility] TEAM 1 - Stepdown/ICU TEAM Progress Note  [name] [name] [id number] DOB: 1951-03-11 DOA: 01/11/2016 PCP: Marlou Sa ERIC, MD  Admit HPI / Brief Narrative: 65 y.o. BF PMHx DM Type 2, CAD native artery S/P CABG, SUBSTANCE ABUSE (cocaine abuse), Chronic Systolic and Diastolic CHF, HTN, HLD, CKD stage III, Thyroid Nodule.  Patient presents to the ED with acute onset gradually worsening SOB that onset 2 hours ago. Patient used cocaine on Monday for the first time in 6 months she states. She claims no chest pain after use until today. Today she developed SOB, and called EMS. She had brief episode of chest pain that lead to consultation with cardiology, NTG, and heparin being started on the patient. She is currently chest pain free. Her SOB has improved with lasix, albuterol, NTG, and Atrovent.   HPI/Subjective: 1/15 A/O 4, NAD, negative CP, negative SOB. Sitting in chair comfortably  Assessment/Plan: Acute on chronic combined CHF - -echocardiogram; see results below -Strict I&O; since admission -694 ml -Daily weight admission weight 126.6 kg          1/15 bed weight= 130.4 kg (accuracy?) -Imdur 30 mg daily -Increase Lasix IV to 60 mg TID; fluid restrict to 1.2 L  Pulmonary hypertension   Chest pain/SOB -Secondary to cocaine use and CHF -see CHF  HTN  -see CHF  Hypokalemia -Potassium goal > 4  -K Dur 40 mEq  Elevated troponin/Demand ischemia -Patient evaluated by cardiology medical management -Troponins trending up but cardiology following  CKD stage III (baseline ~Cr 1.6-1.8) -current Cr slightly above baseline  DM Type 2 uncontrolled -05/12/2015 hemoglobin A1c= 7.2 -Lantus 10 units daily -Increase to moderate SSI  Substance abuse -Consulted CSW for resources to help with drug abuse  Tobacco abuse -Patient states would like to discontinue smoking. Agreed to Wellbutrin increased to 150 mg BID     Code Status: FULL Family Communication: no family  present at time of exam Disposition Plan: SNF    Consultants: Dr.Peter M Martinique cardiology   Procedure/Significant Events: 1/13 echocardiogram; - Left ventricle: moderate LVH. -LVEF=35% to 40%. No mural thrombus identified with contrast.Diffuse hypokinesis. -(grade 2 diastolic dysfunction). - Left atrium: Moderately dilated - Inferior vena cava: The vessel was dilated. The respirophasicdiameter changes were blunted (< 50%), c/w elevated CVP     Culture NA  Antibiotics: NA  DVT prophylaxis: Heparin drip   Devices NA   LINES / TUBES:   NA   Continuous Infusions: . sodium chloride Stopped (01/12/16 1029)    Objective: VITAL SIGNS: Temp: 98.8 F (37.1 C) (01/15 1408) Temp Source: Oral (01/15 1408) BP: 144/64 mmHg (01/15 1339) Pulse Rate: 91 (01/15 1223) SPO2; FIO2:   Intake/Output Summary (Last 24 hours) at 01/13/16 1639 Last data filed at 01/13/16 1223  Gross per 24 hour  Intake    600 ml  Output   2500 ml  Net  -1900 ml     Exam: General: A/O 4, NAD, No acute respiratory distress Eyes: Negative headache, eye pain, double vision,negative scleral hemorrhage ENT: Negative Runny nose, negative ear pain, negative gingival bleeding, Neck:  Negative scars, masses, torticollis, lymphadenopathy, JVD Lungs: Clear to auscultation bilaterally without wheezes or crackles Cardiovascular: Regular rate and rhythm without murmur gallop or rub normal S1 and S2 Abdomen:negative abdominal pain, nondistended, positive soft, bowel sounds, no rebound, no ascites, no appreciable mass Extremities: No significant cyanosis, clubbing, or edema bilateral lower extremities Psychiatric:  Negative depression, negative anxiety, negative fatigue, negative mania  Neurologic:  Cranial nerves  II through XII intact, tongue/uvula midline, all extremities muscle strength 5/5, sensation intact throughout, negative dysarthria, negative expressive aphasia, negative receptive  aphasia.   Data Reviewed: Basic Metabolic Panel:  Recent Labs Lab 01/11/16 0225 01/12/16 0319 01/12/16 1655 01/13/16 0600  NA 144 142  --  139  K 3.2* 3.3* 4.4 4.2  CL 107 107  --  104  CO2 22 25  --  24  GLUCOSE 296* 119*  --  166*  BUN 20 25*  --  33*  CREATININE 1.77* 1.97*  --  2.13*  CALCIUM 9.2 8.7*  --  9.0  MG  --   --  2.0 2.0   Liver Function Tests:  Recent Labs Lab 01/11/16 0225  AST 16  ALT 9*  ALKPHOS 111  BILITOT 0.8  PROT 7.8  ALBUMIN 3.3*   No results for input(s): LIPASE, AMYLASE in the last 168 hours. No results for input(s): AMMONIA in the last 168 hours. CBC:  Recent Labs Lab 01/11/16 0225 01/12/16 0319 01/13/16 0600  WBC 12.4* 9.0 7.2  HGB 9.3* 8.1* 7.5*  HCT 32.2* 26.9* 24.7*  MCV 70.8* 70.1* 69.6*  PLT 462* 356 304   Cardiac Enzymes:  Recent Labs Lab 01/11/16 0922 01/11/16 1500 01/12/16 1850 01/13/16 0008 01/13/16 0600  TROPONINI 1.02* 2.35* 1.05* 0.89* 0.85*   BNP (last 3 results)  Recent Labs  05/12/15 0404 05/14/15 0312 01/11/16 1139  BNP 439.6* 471.1* 1097.7*    ProBNP (last 3 results) No results for input(s): PROBNP in the last 8760 hours.  CBG:  Recent Labs Lab 01/12/16 1725 01/12/16 2133 01/13/16 0820 01/13/16 1250 01/13/16 1541  GLUCAP 157* 132* 149* 179* 137*    Recent Results (from the past 240 hour(s))  MRSA PCR Screening     Status: None   Collection Time: 01/11/16  6:58 PM  Result Value Ref Range Status   MRSA by PCR NEGATIVE NEGATIVE Final    Comment:        The GeneXpert MRSA Assay (FDA approved for NASAL specimens only), is one component of a comprehensive MRSA colonization surveillance program. It is not intended to diagnose MRSA infection nor to guide or monitor treatment for MRSA infections.      Studies:  Recent x-ray studies have been reviewed in detail by the Attending Physician  Scheduled Meds:  Scheduled Meds: . aspirin EC  81 mg Oral Daily  . brinzolamide  1  drop Both Eyes TID  . buPROPion  150 mg Oral Daily  . furosemide  60 mg Intravenous TID  . gabapentin  300 mg Oral QHS  . hydrALAZINE  50 mg Oral 3 times per day  . insulin aspart  0-15 Units Subcutaneous TID WC  . insulin detemir  10 Units Subcutaneous QHS  . isosorbide mononitrate  30 mg Oral Daily  . latanoprost  1 drop Both Eyes QHS  . loratadine  10 mg Oral Daily  . mometasone-formoterol  2 puff Inhalation BID  . potassium chloride  40 mEq Oral BID  . pravastatin  40 mg Oral QHS  . traMADol  50 mg Oral TID    Time spent on care of this patient: 40 mins   Tabari Volkert, Geraldo Docker , MD  [facility] Office  [phone] Pager [phone]  On-Call/Text Page:      [email or web address]      password [email or web address]  If 7PM-7AM, please contact night-coverage [email or web address] Password [email or web address] 01/13/2016, 4:39 PM   LOS: 2 days   Care during the  described time interval was provided by me .  I have reviewed this patient's available data, including medical history, events of note, physical examination, and all test results as part of my evaluation. I have personally reviewed and interpreted all radiology studies.   Dia Crawford, MD [phone] Pager

## 2016-01-14 LAB — CBC
HCT: 26.6 % — ABNORMAL LOW (ref 36.0–46.0)
Hemoglobin: 7.8 g/dL — ABNORMAL LOW (ref 12.0–15.0)
MCH: 20.2 pg — ABNORMAL LOW (ref 26.0–34.0)
MCHC: 29.3 g/dL — ABNORMAL LOW (ref 30.0–36.0)
MCV: 68.9 fL — ABNORMAL LOW (ref 78.0–100.0)
PLATELETS: 354 10*3/uL (ref 150–400)
RBC: 3.86 MIL/uL — AB (ref 3.87–5.11)
RDW: 19.7 % — AB (ref 11.5–15.5)
WBC: 6.9 10*3/uL (ref 4.0–10.5)

## 2016-01-14 LAB — BASIC METABOLIC PANEL
ANION GAP: 9 (ref 5–15)
BUN: 32 mg/dL — ABNORMAL HIGH (ref 6–20)
CALCIUM: 8.9 mg/dL (ref 8.9–10.3)
CO2: 27 mmol/L (ref 22–32)
Chloride: 102 mmol/L (ref 101–111)
Creatinine, Ser: 2.19 mg/dL — ABNORMAL HIGH (ref 0.44–1.00)
GFR, EST AFRICAN AMERICAN: 26 mL/min — AB (ref >60)
GFR, EST NON AFRICAN AMERICAN: 22 mL/min — AB (ref >60)
Glucose, Bld: 111 mg/dL — ABNORMAL HIGH (ref 65–99)
POTASSIUM: 4.3 mmol/L (ref 3.5–5.1)
SODIUM: 138 mmol/L (ref 135–145)

## 2016-01-14 LAB — GLUCOSE, CAPILLARY
GLUCOSE-CAPILLARY: 163 mg/dL — AB (ref 65–99)
Glucose-Capillary: 114 mg/dL — ABNORMAL HIGH (ref 65–99)
Glucose-Capillary: 153 mg/dL — ABNORMAL HIGH (ref 65–99)
Glucose-Capillary: 156 mg/dL — ABNORMAL HIGH (ref 65–99)

## 2016-01-14 LAB — MAGNESIUM: MAGNESIUM: 2 mg/dL (ref 1.7–2.4)

## 2016-01-14 MED ORDER — HEPARIN SODIUM (PORCINE) 5000 UNIT/ML IJ SOLN
5000.0000 [IU] | Freq: Three times a day (TID) | INTRAMUSCULAR | Status: DC
  Administered 2016-01-14 – 2016-01-16 (×5): 5000 [IU] via SUBCUTANEOUS
  Filled 2016-01-14 (×5): qty 1

## 2016-01-14 MED ORDER — FUROSEMIDE 80 MG PO TABS
80.0000 mg | ORAL_TABLET | Freq: Two times a day (BID) | ORAL | Status: DC
  Administered 2016-01-14 – 2016-01-16 (×5): 80 mg via ORAL
  Filled 2016-01-14 (×5): qty 1

## 2016-01-14 NOTE — Care Management Important Message (Signed)
Important Message  Patient Details  Name: [name] [name] MRN: [id number] Date of Birth: [birth year]   Medicare Important Message Given:  Yes    Loann Quill 01/14/2016, 12:41 PM

## 2016-01-14 NOTE — Progress Notes (Signed)
SUBJECTIVE:  She says that she is not yet breathing at baseline.  She has no chest pain.  She is worried because she has to walk up two flights of stairs to get to her apartment when she leaves   PHYSICAL EXAM Filed Vitals:   01/13/16 1949 01/13/16 2108 01/14/16 0406 01/14/16 0725  BP:    133/71  Pulse:    88  Temp: 99.6 F (37.6 C)  99.5 F (37.5 C) 99.2 F (37.3 C)  TempSrc: Oral  Oral Oral  Resp:    17  Height:      Weight:   279 lb 12.2 oz (126.9 kg)   SpO2:  98%  98%   General:  No acute distress Lungs:  Clear Heart:  RRR Abdomen:  Positive bowel sounds, no rebound no guarding Extremities:  No edema   LABS: Lab Results  Component Value Date   TROPONINI 0.85* 01/13/2016   Results for orders placed or performed during the hospital encounter of 01/11/16 (from the past 24 hour(s))  Glucose, capillary     Status: Abnormal   Collection Time: 01/13/16 12:50 PM  Result Value Ref Range   Glucose-Capillary 179 (H) 65 - 99 mg/dL  Glucose, capillary     Status: Abnormal   Collection Time: 01/13/16  3:41 PM  Result Value Ref Range   Glucose-Capillary 137 (H) 65 - 99 mg/dL  Glucose, capillary     Status: Abnormal   Collection Time: 01/13/16  9:18 PM  Result Value Ref Range   Glucose-Capillary 143 (H) 65 - 99 mg/dL  CBC     Status: Abnormal   Collection Time: 01/14/16  4:27 AM  Result Value Ref Range   WBC 6.9 4.0 - 10.5 K/uL   RBC 3.86 (L) 3.87 - 5.11 MIL/uL   Hemoglobin 7.8 (L) 12.0 - 15.0 g/dL   HCT 26.6 (L) 36.0 - 46.0 %   MCV 68.9 (L) 78.0 - 100.0 fL   MCH 20.2 (L) 26.0 - 34.0 pg   MCHC 29.3 (L) 30.0 - 36.0 g/dL   RDW 19.7 (H) 11.5 - 15.5 %   Platelets 354 150 - 400 K/uL  Basic metabolic panel     Status: Abnormal   Collection Time: 01/14/16  4:27 AM  Result Value Ref Range   Sodium 138 135 - 145 mmol/L   Potassium 4.3 3.5 - 5.1 mmol/L   Chloride 102 101 - 111 mmol/L   CO2 27 22 - 32 mmol/L   Glucose, Bld 111 (H) 65 - 99 mg/dL   BUN 32 (H) 6 - 20 mg/dL     Creatinine, Ser 2.19 (H) 0.44 - 1.00 mg/dL   Calcium 8.9 8.9 - 10.3 mg/dL   GFR calc non Af Amer 22 (L) >60 mL/min   GFR calc Af Amer 26 (L) >60 mL/min   Anion gap 9 5 - 15  Magnesium     Status: None   Collection Time: 01/14/16  4:27 AM  Result Value Ref Range   Magnesium 2.0 1.7 - 2.4 mg/dL  Glucose, capillary     Status: Abnormal   Collection Time: 01/14/16  7:23 AM  Result Value Ref Range   Glucose-Capillary 114 (H) 65 - 99 mg/dL    Intake/Output Summary (Last 24 hours) at 01/14/16 0830 Last data filed at 01/14/16 0410  Gross per 24 hour  Intake    840 ml  Output   2600 ml  Net  -1760 ml   CXR:  Interval decrease  in diffuse interstitial edema pattern. Stable Cardiomegaly.   ASSESSMENT AND PLAN:  ACUTE ON CHRONIC SYSTOLIC HF:  Good UO yesterday.   Weight is down 8 lbs.  She can be switched to PO diuretic.  Can move out of stepdown.    ELEVATED TROPONIN:  Felt to be secondary to demand ischemia.  Cath May of last year with patent grafts.  Continue medical management.    HTN:  BP OK.    CKD:  Creat is stable.     Jeneen Rinks Deborha Moseley 01/14/2016 8:30 AM

## 2016-01-14 NOTE — Progress Notes (Signed)
[facility] TEAM 1 - Stepdown/ICU TEAM PROGRESS NOTE  [name] [name] [id number] DOB: [birth year] DOA: 01/11/2016 PCP: Marlou Sa ERIC, MD  Admit HPI / Brief Narrative: 65 y.o. F Hx DM2, CAD S/P CABG, SUBSTANCE ABUSE (cocaine), Chronic Systolic and Diastolic CHF, HTN, HLD, CKD stage III, and a Thyroid Nodule who presented to the ED with acute onset gradually worsening SOB. Patient used cocaine prior to her sx onset.  She had a brief episode of chest pain that lead to consultation with Cardiology, tx w/ NTG and heparin. Her SOB  improved with lasix, albuterol, NTG, and Atrovent.  HPI/Subjective: The patient denies shortness of breath or chest pain at rest.  She states she feels extremely weak in general however and fear she will not be able to pin for herself at home where she has to climb multiple flights of stairs.  She denies nausea vomiting or abdominal pain.  Assessment/Plan:  Acute exacerbation of chronic systolic CHF  -admission weight 126.6 kg - currently 126.9 - ?accuracy  -net negative ~3L since admit  -continue maintenance diuresis and restrictive diet  Chest pain - Elevated troponin - CAD s/p CABG -Secondary to cocaine use and CHF - peaked at 2.35 -Cards has seen - med management only  -Cath May of last year with patent grafts  HTN  -BP reasonably controlled  Hypokalemia -Potassium at goal  CKD stage III (baseline ~Cr 1.6-1.8) -crt stable, but slightly higher than prior baseline - follow trend   Microcytic anemia  -check anemia panel - would technically prefer hemoglobin 8.0 or greater but with hemoglobin climbing will recheck in a.m. before transfusing - if iron deficiency proven patient will need further evaluation (possibly as outpatient)  DM Type 2 -A1c 6.0 - CBG presently controlled  Pulmonary hypertension    Substance abuse -Consulted CSW for resources to help with drug abuse  Tobacco abuse -Patient states would like to discontinue smoking - wellbutrin  increased to 150 mg BID  Code Status: FULL Family Communication: no family present at time of exam Disposition Plan: Stable for transfer to telemetry bed - assure stable on maintenance medications - PT/OT evaluations - placement may be in her best interest both from drug abuse standpoint as well as deconditioning  Consultants: [facility]   Procedures: 1/13 TTE - EF 35-40% - diffuse hypokinesis - grade 2 DD - LA mod dilated  Antibiotics: none  DVT prophylaxis: SQ heparin   Objective: Blood pressure 115/62, pulse 91, temperature 98.6 F (37 C), temperature source Oral, resp. rate 16, height 5' 8.5" (1.74 m), weight 126.9 kg (279 lb 12.2 oz), SpO2 95 %.  Intake/Output Summary (Last 24 hours) at 01/14/16 1511 Last data filed at 01/14/16 1300  Gross per 24 hour  Intake    480 ml  Output   2850 ml  Net  -2370 ml   Exam: General: No acute respiratory distress Lungs: Clear to auscultation bilaterally without wheezes or crackles Cardiovascular: Regular rate and rhythm without murmur gallop or rub normal S1 and S2 Abdomen: Nontender, nondistended, soft, bowel sounds positive, no rebound, no ascites, no appreciable mass Extremities: No significant cyanosis, clubbing, or edema bilateral lower extremities  Data Reviewed:  Basic Metabolic Panel:  Recent Labs Lab 01/11/16 0225 01/12/16 0319 01/12/16 1655 01/13/16 0600 01/14/16 0427  NA 144 142  --  139 138  K 3.2* 3.3* 4.4 4.2 4.3  CL 107 107  --  104 102  CO2 22 25  --  24 27  GLUCOSE 296* 119*  --  166* 111*  BUN 20 25*  --  33* 32*  CREATININE 1.77* 1.97*  --  2.13* 2.19*  CALCIUM 9.2 8.7*  --  9.0 8.9  MG  --   --  2.0 2.0 2.0    CBC:  Recent Labs Lab 01/11/16 0225 01/12/16 0319 01/13/16 0600 01/14/16 0427  WBC 12.4* 9.0 7.2 6.9  HGB 9.3* 8.1* 7.5* 7.8*  HCT 32.2* 26.9* 24.7* 26.6*  MCV 70.8* 70.1* 69.6* 68.9*  PLT 462* 356 304 354    Liver Function Tests:  Recent Labs Lab 01/11/16 0225  AST 16    ALT 9*  ALKPHOS 111  BILITOT 0.8  PROT 7.8  ALBUMIN 3.3*    Coags:  Recent Labs Lab 01/11/16 0225  INR 1.19    Recent Labs Lab 01/11/16 0225  APTT 27    Cardiac Enzymes:  Recent Labs Lab 01/11/16 0922 01/11/16 1500 01/12/16 1850 01/13/16 0008 01/13/16 0600  TROPONINI 1.02* 2.35* 1.05* 0.89* 0.85*    CBG:  Recent Labs Lab 01/13/16 1250 01/13/16 1541 01/13/16 2118 01/14/16 0723 01/14/16 1142  GLUCAP 179* 137* 143* 114* 163*    Recent Results (from the past 240 hour(s))  MRSA PCR Screening     Status: None   Collection Time: 01/11/16  6:58 PM  Result Value Ref Range Status   MRSA by PCR NEGATIVE NEGATIVE Final    Comment:        The GeneXpert MRSA Assay (FDA approved for NASAL specimens only), is one component of a comprehensive MRSA colonization surveillance program. It is not intended to diagnose MRSA infection nor to guide or monitor treatment for MRSA infections.      Studies:   Recent x-ray studies have been reviewed in detail by the Attending Physician  Scheduled Meds:  Scheduled Meds: . aspirin EC  81 mg Oral Daily  . brinzolamide  1 drop Both Eyes TID  . buPROPion  150 mg Oral BID  . furosemide  80 mg Oral BID  . gabapentin  300 mg Oral QHS  . hydrALAZINE  50 mg Oral 3 times per day  . insulin aspart  0-15 Units Subcutaneous TID WC  . insulin detemir  10 Units Subcutaneous QHS  . isosorbide mononitrate  30 mg Oral Daily  . latanoprost  1 drop Both Eyes QHS  . loratadine  10 mg Oral Daily  . mometasone-formoterol  2 puff Inhalation BID  . potassium chloride  40 mEq Oral BID  . pravastatin  40 mg Oral QHS  . traMADol  50 mg Oral TID    Time spent on care of this patient: 35 mins   MCCLUNG,JEFFREY T , MD   [facility] Office  [phone] Pager - Text Page per Shea Evans as per below:  On-Call/Text Page:      [email or web address]      password [email or web address]  If 7PM-7AM, please contact night-coverage [email or web address] Password  [email or web address] 01/14/2016, 3:11 PM   LOS: 3 days

## 2016-01-14 NOTE — Care Management Note (Signed)
Case Management Note  Patient Details  Name: [name] [name] MRN: [id number] Date of Birth: Jan 23, 1951  Subjective/Objective:   Date: 01/13/26 Spoke with patient at the bedside.  Introduced self as Tourist information centre manager and explained role in discharge planning and how to be reached.  Verified patient lives in town, with spouse. Has DME rolling walker, cane at home.  Expressed potential need for no other DME.  Per pt eval rec SNF at time of discharge  CSW referral.  Patient denied needing help with their medication.  Patient is driven by her aunt to MD appointments.  Verified patient has PCP Marlou Sa.   Plan: CM will continue to follow for discharge planning and [facility] resources.                  Action/Plan: CHF, weight is down, transition to po diuretic, transfer.   Expected Discharge Date:                  Expected Discharge Plan:  Skilled Nursing Facility  In-House Referral:  Clinical Social Work  Discharge planning Services  CM Consult  Post Acute Care Choice:    Choice offered to:     DME Arranged:    DME Agency:     HH Arranged:    [place] Agency:     Status of Service:  In process, will continue to follow  Medicare Important Message Given:  Yes Date Medicare IM Given:    Medicare IM give by:    Date Additional Medicare IM Given:    Additional Medicare Important Message give by:     If discussed at [place] of Stay Meetings, dates discussed:    Additional Comments:  Zenon Mayo, RN 01/14/2016, 4:03 PM

## 2016-01-14 NOTE — Clinical Documentation Improvement (Signed)
Internal Medicine  Can the diagnosis of anemia be further specified documented in 1/15 Cardiology progress note? Please document response in next progress note; NOT in BPA drop down box. Thanks.   Iron deficiency Anemia  Nutritional anemia, including the nutrition or mineral deficits  Chronic Blood Loss Anemia, including the suspected or known cause  Acute on Chronic Blood Loss Anemia  Anemia of chronic disease, including the associated chronic disease state  Other  Clinically Undetermined  Document any associated diagnoses/conditions.  Supporting Information:  H&H initially was 9.3/32; has dropped to 7.5/24.7  No treatment given  Please exercise your independent, professional judgment when responding. A specific answer is not anticipated or expected.  Thank You,  Zoila Shutter RN, BSN, [place] [phone] ; Cell: [phone]

## 2016-01-15 ENCOUNTER — Ambulatory Visit (HOSPITAL_COMMUNITY): Payer: Medicare Other

## 2016-01-15 ENCOUNTER — Inpatient Hospital Stay (HOSPITAL_COMMUNITY): Payer: Medicare Other

## 2016-01-15 DIAGNOSIS — R778 Other specified abnormalities of plasma proteins: Secondary | ICD-10-CM

## 2016-01-15 DIAGNOSIS — E875 Hyperkalemia: Secondary | ICD-10-CM

## 2016-01-15 DIAGNOSIS — L0291 Cutaneous abscess, unspecified: Secondary | ICD-10-CM

## 2016-01-15 DIAGNOSIS — R2232 Localized swelling, mass and lump, left upper limb: Secondary | ICD-10-CM

## 2016-01-15 DIAGNOSIS — I5021 Acute systolic (congestive) heart failure: Secondary | ICD-10-CM

## 2016-01-15 DIAGNOSIS — I5023 Acute on chronic systolic (congestive) heart failure: Secondary | ICD-10-CM

## 2016-01-15 LAB — CBC
HEMATOCRIT: 27.9 % — AB (ref 36.0–46.0)
HEMOGLOBIN: 8.3 g/dL — AB (ref 12.0–15.0)
MCH: 20.6 pg — AB (ref 26.0–34.0)
MCHC: 29.7 g/dL — ABNORMAL LOW (ref 30.0–36.0)
MCV: 69.4 fL — AB (ref 78.0–100.0)
Platelets: 371 10*3/uL (ref 150–400)
RBC: 4.02 MIL/uL (ref 3.87–5.11)
RDW: 19.8 % — ABNORMAL HIGH (ref 11.5–15.5)
WBC: 7.9 10*3/uL (ref 4.0–10.5)

## 2016-01-15 LAB — VITAMIN B12: VITAMIN B 12: 305 pg/mL (ref 180–914)

## 2016-01-15 LAB — COMPREHENSIVE METABOLIC PANEL
ALBUMIN: 3 g/dL — AB (ref 3.5–5.0)
ALT: 12 U/L — ABNORMAL LOW (ref 14–54)
ANION GAP: 10 (ref 5–15)
AST: 16 U/L (ref 15–41)
Alkaline Phosphatase: 92 U/L (ref 38–126)
BUN: 36 mg/dL — ABNORMAL HIGH (ref 6–20)
CALCIUM: 9.1 mg/dL (ref 8.9–10.3)
CHLORIDE: 99 mmol/L — AB (ref 101–111)
CO2: 28 mmol/L (ref 22–32)
Creatinine, Ser: 2.15 mg/dL — ABNORMAL HIGH (ref 0.44–1.00)
GFR calc non Af Amer: 23 mL/min — ABNORMAL LOW (ref >60)
GFR, EST AFRICAN AMERICAN: 27 mL/min — AB (ref >60)
Glucose, Bld: 124 mg/dL — ABNORMAL HIGH (ref 65–99)
POTASSIUM: 5.5 mmol/L — AB (ref 3.5–5.1)
SODIUM: 137 mmol/L (ref 135–145)
Total Bilirubin: 0.7 mg/dL (ref 0.3–1.2)
Total Protein: 6.9 g/dL (ref 6.5–8.1)

## 2016-01-15 LAB — FERRITIN: FERRITIN: 26 ng/mL (ref 11–307)

## 2016-01-15 LAB — GLUCOSE, CAPILLARY
GLUCOSE-CAPILLARY: 132 mg/dL — AB (ref 65–99)
GLUCOSE-CAPILLARY: 200 mg/dL — AB (ref 65–99)
GLUCOSE-CAPILLARY: 187 mg/dL — AB (ref 65–99)
GLUCOSE-CAPILLARY: 156 mg/dL — AB (ref 65–99)

## 2016-01-15 LAB — IRON AND TIBC
Iron: 18 ug/dL — ABNORMAL LOW (ref 28–170)
Saturation Ratios: 5 % — ABNORMAL LOW (ref 10.4–31.8)
TIBC: 342 ug/dL (ref 250–450)
UIBC: 324 ug/dL

## 2016-01-15 LAB — RETICULOCYTES
RBC.: 3.98 MIL/uL (ref 3.87–5.11)
RETIC COUNT ABSOLUTE: 79.6 10*3/uL (ref 19.0–186.0)
RETIC CT PCT: 2 % (ref 0.4–3.1)

## 2016-01-15 LAB — FOLATE: Folate: 10.3 ng/mL (ref >5.9)

## 2016-01-15 MED ORDER — BISACODYL 5 MG PO TBEC
5.0000 mg | DELAYED_RELEASE_TABLET | Freq: Once | ORAL | Status: AC
  Administered 2016-01-15: 5 mg via ORAL
  Filled 2016-01-15: qty 1

## 2016-01-15 MED ORDER — SODIUM CHLORIDE 0.9 % IV SOLN
510.0000 mg | Freq: Once | INTRAVENOUS | Status: AC
  Administered 2016-01-15: 510 mg via INTRAVENOUS
  Filled 2016-01-15: qty 17

## 2016-01-15 MED ORDER — MAGNESIUM HYDROXIDE 400 MG/5ML PO SUSP
30.0000 mL | Freq: Once | ORAL | Status: AC
Start: 2016-01-15 — End: 2016-01-15
  Administered 2016-01-15: 30 mL via ORAL
  Filled 2016-01-15: qty 30

## 2016-01-15 NOTE — Progress Notes (Signed)
Patient given hot packs for left arm with thrombus. Left arm is elevated on the pillow. Currently patient complains of no pain just very mild discomfort and appreciates the hot packs. Will continue to monitor patient to end of shift.

## 2016-01-15 NOTE — Progress Notes (Signed)
Patient notified nurse of a knot that can be felt above the peripheral IV. When palpated a hard knot is noted and slightly tender to touch. Took out the peripheral IV and cleaned the site. After taking out the peripheral IV noticed the insertion site was a pale yellow. Small amount of redness noted. Notified IV team and was instructed to clean the site with a Purple pad,chlorahexidine wipe and continue to monitor. Notified oncoming nurse at shift change to make rounding doctor aware. Nurse signing off at this time.

## 2016-01-15 NOTE — Progress Notes (Signed)
SUBJECTIVE:  She says that she is breathing at an "8/10".     PHYSICAL EXAM Filed Vitals:   01/14/16 2059 01/15/16 0030 01/15/16 0450 01/15/16 0804  BP: 141/64 132/71 146/79 119/55  Pulse: 89 92 88 97  Temp: 99 F (37.2 C) 98.2 F (36.8 C) 98.5 F (36.9 C) 98.7 F (37.1 C)  TempSrc: Oral Oral Oral Oral  Resp: 20 20 20 20   Height:      Weight:   257 lb 8 oz (116.8 kg)   SpO2: 100% 95% 99% 97%   General:  No acute distress Lungs:  Clear Heart:  RRR Abdomen:  Positive bowel sounds, no rebound no guarding Extremities:  No edema   LABS:  Results for orders placed or performed during the hospital encounter of 01/11/16 (from the past 24 hour(s))  Glucose, capillary     Status: Abnormal   Collection Time: 01/14/16 11:42 AM  Result Value Ref Range   Glucose-Capillary 163 (H) 65 - 99 mg/dL  Glucose, capillary     Status: Abnormal   Collection Time: 01/14/16  4:51 PM  Result Value Ref Range   Glucose-Capillary 153 (H) 65 - 99 mg/dL  Glucose, capillary     Status: Abnormal   Collection Time: 01/14/16 10:51 PM  Result Value Ref Range   Glucose-Capillary 156 (H) 65 - 99 mg/dL   Comment 1 Notify RN    Comment 2 Document in Chart   Glucose, capillary     Status: Abnormal   Collection Time: 01/15/16  5:57 AM  Result Value Ref Range   Glucose-Capillary 132 (H) 65 - 99 mg/dL   Comment 1 Notify RN    Comment 2 Document in Chart   Vitamin B12     Status: None   Collection Time: 01/15/16  6:03 AM  Result Value Ref Range   Vitamin B-12 305 180 - 914 pg/mL  Folate     Status: None   Collection Time: 01/15/16  6:03 AM  Result Value Ref Range   Folate 10.3 >5.9 ng/mL  Iron and TIBC     Status: Abnormal   Collection Time: 01/15/16  6:03 AM  Result Value Ref Range   Iron 18 (L) 28 - 170 ug/dL   TIBC 342 250 - 450 ug/dL   Saturation Ratios 5 (L) 10.4 - 31.8 %   UIBC 324 ug/dL  Ferritin     Status: None   Collection Time: 01/15/16  6:03 AM  Result Value Ref Range   Ferritin  26 11 - 307 ng/mL  Reticulocytes     Status: None   Collection Time: 01/15/16  6:03 AM  Result Value Ref Range   Retic Ct Pct 2.0 0.4 - 3.1 %   RBC. 3.98 3.87 - 5.11 MIL/uL   Retic Count, Manual 79.6 19.0 - 186.0 K/uL  Comprehensive metabolic panel     Status: Abnormal   Collection Time: 01/15/16  6:03 AM  Result Value Ref Range   Sodium 137 135 - 145 mmol/L   Potassium 5.5 (H) 3.5 - 5.1 mmol/L   Chloride 99 (L) 101 - 111 mmol/L   CO2 28 22 - 32 mmol/L   Glucose, Bld 124 (H) 65 - 99 mg/dL   BUN 36 (H) 6 - 20 mg/dL   Creatinine, Ser 2.15 (H) 0.44 - 1.00 mg/dL   Calcium 9.1 8.9 - 10.3 mg/dL   Total Protein 6.9 6.5 - 8.1 g/dL   Albumin 3.0 (L) 3.5 - 5.0 g/dL  AST 16 15 - 41 U/L   ALT 12 (L) 14 - 54 U/L   Alkaline Phosphatase 92 38 - 126 U/L   Total Bilirubin 0.7 0.3 - 1.2 mg/dL   GFR calc non Af Amer 23 (L) >60 mL/min   GFR calc Af Amer 27 (L) >60 mL/min   Anion gap 10 5 - 15  CBC     Status: Abnormal   Collection Time: 01/15/16  6:03 AM  Result Value Ref Range   WBC 7.9 4.0 - 10.5 K/uL   RBC 4.02 3.87 - 5.11 MIL/uL   Hemoglobin 8.3 (L) 12.0 - 15.0 g/dL   HCT 27.9 (L) 36.0 - 46.0 %   MCV 69.4 (L) 78.0 - 100.0 fL   MCH 20.6 (L) 26.0 - 34.0 pg   MCHC 29.7 (L) 30.0 - 36.0 g/dL   RDW 19.8 (H) 11.5 - 15.5 %   Platelets 371 150 - 400 K/uL    Intake/Output Summary (Last 24 hours) at 01/15/16 1052 Last data filed at 01/15/16 0853  Gross per 24 hour  Intake    900 ml  Output   2350 ml  Net  -1450 ml     ASSESSMENT AND PLAN:  ACUTE ON CHRONIC SYSTOLIC HF:  Good UO yesterday.  On PO diuretic. I don't know what her weight is because there is such variability from scale to scale and measurement to measurement.   However, I suspect that she is lighter now than when she came into the hospital.    I would guess that her home dose of Lasix should be 80 mg daily (half of what she is on now but twice as much as prior to admission.)  ELEVATED TROPONIN:  Felt to be secondary to demand  ischemia.  Cath May of last year with patent grafts.  Continue medical management.    HTN:  BP OK.    CKD:  Creat is stable.     Jeneen Rinks [facility] 01/15/2016 10:52 AM

## 2016-01-15 NOTE — Progress Notes (Signed)
Physical Therapy Treatment Patient Details Name: [name] [name] MRN: [id number] DOB: 01/26/1951 Today's Date: 01/15/2016    History of Present Illness Patient is a 65 yo female admitted 01/11/16 with CHF exacerbation, anemia, elevated troponin (demand ischemia per chart).   PMH:  DM, CAD, CABG, CHF, HTN, CVA, cocaine abuse, obesity    PT Comments    Patient progressing with ambulation tolerance and improved safety awareness this session.  Encouraged to try stairs but pt reported unable to try today.  Still self limits due to reported SOB, but only noted minimal dyspnea.  Will benefit from SNF level rehab as unable to access her home or tolerate activity needed for return home at this time.  Will continue skilled PT in the acute setting prior to d/c.  Follow Up Recommendations  SNF;Supervision for mobility/OOB     Equipment Recommendations  Other (comment) (shower seat)    Recommendations for Other Services       Precautions / Restrictions Precautions Precautions: Fall Precaution Comments: Fatigues quickly     Mobility  Bed Mobility               General bed mobility comments: seated EOB  Transfers Overall transfer level: Needs assistance Equipment used: Rolling walker (2 wheeled) Transfers: Sit to/from Stand Sit to Stand: Supervision         General transfer comment: assist for safety; pulls up on walker  Ambulation/Gait Ambulation/Gait assistance: Supervision;Min guard Ambulation Distance (Feet): 90 Feet (x 2) Assistive device: Rolling walker (2 wheeled) Gait Pattern/deviations: Step-through pattern;Decreased stride length;Shuffle     General Gait Details: minguard at times due to c/o fatigue, asked for chair prior to sitting, HR 117 with ambulation, DOE 1/4   Stairs            Wheelchair Mobility    Modified Rankin (Stroke Patients Only)       Balance Overall balance assessment: Needs assistance   Sitting balance-Leahy Scale: Good      Standing balance support: Bilateral upper extremity supported Standing balance-Leahy Scale: Poor Standing balance comment: UE support needed for balance                    Cognition Arousal/Alertness: Awake/alert Behavior During Therapy: WFL for tasks assessed/performed Overall Cognitive Status: Within Functional Limits for tasks assessed                      Exercises      General Comments        Pertinent Vitals/Pain Pain Assessment: 0-10 Pain Score: 6  Pain Location: L thigh during ambulation, no pain at rest Pain Descriptors / Indicators: Aching Pain Intervention(s): Monitored during session;Repositioned    Home Living                      Prior Function            PT Goals (current goals can now be found in the care plan section) Progress towards PT goals: Progressing toward goals    Frequency  Min 3X/week    PT Plan Current plan remains appropriate    Co-evaluation             End of Session Equipment Utilized During Treatment: Gait belt Activity Tolerance: Patient limited by fatigue Patient left: with call bell/phone within reach;in bed (seated EOB)     Time: [id number] PT Time Calculation (min) (ACUTE ONLY): 21 min  Charges:  $Gait Training: 8-22 mins  G CodesReginia Naas 01/15/2016, 12:57 PM Magda Kiel, [place] 01/15/2016

## 2016-01-15 NOTE — Progress Notes (Addendum)
Paged Dr. Ree Kida regarding acute findings of thrombus in L cephalic vein AC. No interventions needed at this time other than promoting pt comfort of hot packs and elevation on pillow. Will continue to monitor pt.      Maurene Capes R

## 2016-01-15 NOTE — Progress Notes (Signed)
Patient requesting something to help bowels to move. Notified on-call hospitalist. Awaiting orders and or return page. Will continue to monitor patient to end of shift.

## 2016-01-15 NOTE — Progress Notes (Signed)
VASCULAR LAB PRELIMINARY  PRELIMINARY  PRELIMINARY  PRELIMINARY    No DVT in left arm , however ACUTE thrombus at IV site in the cephalic vein of left antecubital fossa of the left arm. Called RN Romelle Starcher @4 :55 pm with the results.  Janifer Adie, RVT, RDMS 01/15/2016, 4:51 PM

## 2016-01-15 NOTE — Evaluation (Signed)
Occupational Therapy Evaluation Patient Details Name: [name] [name] MRN: [id number] DOB: October 19, 1951 Today's Date: 01/15/2016    History of Present Illness Patient is a 65 yo female admitted 01/11/16 with CHF exacerbation, anemia, elevated troponin (demand ischemia per chart).   PMH:  DM, CAD, CABG, CHF, HTN, CVA, cocaine abuse, obesity   Clinical Impression   Patient presenting with decreased ADL and functional mobility independence secondary to above. Patient independent to mod I PTA. Patient currently functioning at an overall supervision to min assist level. Patient will benefit from acute OT to increase overall independence in the areas of ADLs, functional mobility, and overall safety in order to safely discharge home vs SNF.     Follow Up Recommendations  Home health OT;Supervision/Assistance - 24 hour (will benefit from SNF if unable to tolerate stair navigation to get into her apartment AND if decreased support at home) According to CM note, pt is refusing SNF placement at this time.    Equipment Recommendations  3 in 1 bedside comode    Recommendations for Other Services  None at this time    Precautions / Restrictions Precautions Precautions: Fall Precaution Comments: Fatigues quickly  Restrictions Weight Bearing Restrictions: No    Mobility Bed Mobility Overal bed mobility: Needs Assistance Bed Mobility: Supine to Sit     Supine to sit: Supervision     General bed mobility comments: supervision for safety   Transfers Overall transfer level: Needs assistance Equipment used: None Transfers: Sit to/from Stand Sit to Stand: Min guard;Supervision General transfer comment: Supervision to min guard for safety, pt holding onto IV pole and tended to furniture walk    Balance Overall balance assessment: Needs assistance Sitting-balance support: No upper extremity supported;Feet supported Sitting balance-Leahy Scale: Good     Standing balance support: No upper  extremity supported;During functional activity Standing balance-Leahy Scale: Fair Standing balance comment: UE support needed for balance    ADL Overall ADL's : Needs assistance/impaired General ADL Comments: Pt overall supervision to min assist for ADLs and functional mobility. Pt using IV pole for functional mobility within room.     Pertinent Vitals/Pain Pain Assessment: No/denies pain Pain Score: 6  Pain Location: L thigh during ambulation, no pain at rest Pain Descriptors / Indicators: Aching Pain Intervention(s): Monitored during session;Repositioned     Hand Dominance Right   Extremity/Trunk Assessment Upper Extremity Assessment Upper Extremity Assessment: Overall WFL for tasks assessed   Lower Extremity Assessment Lower Extremity Assessment: Defer to PT evaluation   Cervical / Trunk Assessment Cervical / Trunk Assessment: Normal   Communication Communication Communication: No difficulties   Cognition Arousal/Alertness: Awake/alert Behavior During Therapy: WFL for tasks assessed/performed Overall Cognitive Status: Within Functional Limits for tasks assessed             Home Living Family/patient expects to be discharged to:: Private residence Living Arrangements: Spouse/significant other Available Help at Discharge: Family;Available 24 hours/day Type of Home: Apartment Home Access: Stairs to enter [employer] of Steps: 2 flights of stairs Entrance Stairs-Rails: Right Home Layout: One level     Bathroom Shower/Tub: Corporate investment banker: Standard     Home Equipment: Environmental consultant - 2 wheels;Cane - single point   Prior Functioning/Environment Level of Independence: Independent with assistive device(s);Needs assistance  Gait / Transfers Assistance Needed: Uses cane or RW for gait, depending on how strong she feels that day. ADL's / Homemaking Assistance Needed: Assist for bathing, meal prep, and housekeeping.    OT Diagnosis:  Generalized weakness  OT Problem List: Decreased strength;Decreased activity tolerance;Impaired balance (sitting and/or standing);Decreased safety awareness;Decreased knowledge of use of DME or AE;Decreased knowledge of precautions   OT Treatment/Interventions: Self-care/ADL training;Therapeutic exercise;Energy conservation;DME and/or AE instruction;Therapeutic activities;Patient/family education;Balance training    OT Goals(Current goals can be found in the care plan section) Acute Rehab OT Goals Patient Stated Goal: go home OT Goal Formulation: With patient Time For Goal Achievement: 01/22/16 Potential to Achieve Goals: Good ADL Goals Pt Will Perform Grooming: with modified independence;standing Pt Will Perform Lower Body Bathing: with modified independence;sit to/from stand Pt Will Perform Lower Body Dressing: with modified independence;sit to/from stand Pt Will Transfer to Toilet: with modified independence;ambulating;bedside commode Pt Will Perform Tub/Shower Transfer: Tub transfer;ambulating;3 in 1;with modified independence Additional ADL Goal #1: Pt will be mod I with functional mobility using LRAD   OT Frequency: Min 2X/week   Barriers to D/C: None known at this time    End of Session Activity Tolerance: Patient tolerated treatment well Patient left: in chair;with call bell/phone within reach   Time: 1517-1530 OT Time Calculation (min): 13 min Charges:  OT General Charges $OT Visit: 1 Procedure OT Evaluation $OT Eval Moderate Complexity: 1 Procedure  Chrys Racer , MS, OTR/L, CLT Pager: [phone]  01/15/2016, 3:43 PM

## 2016-01-15 NOTE — Progress Notes (Addendum)
[facility]                                                                              Patient Demographics  [name] [name], is a 65 y.o. female, DOB - [birth year], [id number]  Admit date - 01/11/2016   Admitting Physician Allie Bossier, MD  Outpatient Primary MD for the patient is Marlou Sa, ERIC, MD  LOS - 4   Chief Complaint  Patient presents with  . Shortness of Breath      HPI on 01/11/2016 by Dr. Jennette Kettle [name] [name] is a 65 y.o. female with h/o DM, CAD with CABG, cocaine abuse, CHF. Patient presents to the ED with acute onset gradually worsening SOB that onset 2 hours ago. Patient used cocaine on Monday for the first time in 6 months she states. She claims no chest pain after use until today. Today she developed SOB, and called EMS. She had brief episode of chest pain that lead to consultation with cardiology, NTG, and heparin being started on the patient. She is currently chest pain free. Her SOB has improved with lasix, albuterol, NTG, and Atrovent.  Assessment & Plan   Acute exacerbation of chronic systolic CHF  -admission weight 126.6 kg - currently 116.8kg - ?accuracy given variability with scales -continue maintenance diuresis and restrictive diet -Cardiology consulted and appreciated, recommended Lasix 80 mg daily -Continue to monitor intake and output, daily weight -Urine output 1950 over the past 24 hours -Echocardiogram EF [id number], grade 2 diastolic dysfunction  Chest pain - Elevated troponin - CAD s/p CABG -Secondary to cocaine use and CHF - peaked at 2.35 -Cards has seen - med management only  -Cath May of last year with patent grafts -Echocardiogram as above  HTN  -Currently soft, continue Lasix, hydralazine with holding parameters  Hyperkalemia/ hypokalemia -Continue to monitor BMP, potassium 5.5 today -Suspect patient will have bowel movement today along with Lasix which should resolve her hyperkalemia  Acute on CKD stage III (baseline  ~Cr 1.6-1.8) -creatinine currently 2.15 -Continue to monitor BMP  Microcytic anemia, iron def  -Anemia panel: Iron 18, ferritin 26 -Will give dose of Feraheme -Hemoglobin currently 8.3 -Patient will need outpatient follow-up and workup  DM Type 2 -A1c 6.0 - CBG presently controlled -Continue Levemir, NovoLog, CBG monitoring  Pulmonary hypertension   Substance abuse -Consulted CSW for resources to help with drug abuse  Tobacco abuse -Patient states would like to discontinue smoking - wellbutrin increased to 150 mg BID  Physical deconditioning -PT consulted and recommended nursing facility, patient refused -Patient would like to return home and home health services  Left upper extremity nodule -patient had IV site in left antecubital fossa- removed today -no discharge seen, however nodule palpated -Will order LUE [place]  Code Status: Full  Family Communication: None at bedside  Disposition Plan: Admitted. Pending further recommendations from cardiology.  Time Spent in minutes   30 minutes  Procedures  Echocardiogram  Consults   Cardiology  DVT Prophylaxis  heparin  Lab Results  Component Value Date   PLT 371 01/15/2016    Medications  Scheduled Meds: . aspirin EC  81 mg Oral Daily  . brinzolamide  1 drop Both  Eyes TID  . buPROPion  150 mg Oral BID  . furosemide  80 mg Oral BID  . gabapentin  300 mg Oral QHS  . heparin subcutaneous  5,000 Units Subcutaneous 3 times per day  . hydrALAZINE  50 mg Oral 3 times per day  . insulin aspart  0-15 Units Subcutaneous TID WC  . insulin detemir  10 Units Subcutaneous QHS  . isosorbide mononitrate  30 mg Oral Daily  . latanoprost  1 drop Both Eyes QHS  . loratadine  10 mg Oral Daily  . mometasone-formoterol  2 puff Inhalation BID  . pravastatin  40 mg Oral QHS  . traMADol  50 mg Oral TID   Continuous Infusions:  PRN Meds:.acetaminophen, Influenza vac split quadrivalent PF, ondansetron (ZOFRAN) IV  Antibiotics      Anti-infectives    None      Subjective:   [name] [name] seen and examined today. Patient states she is feeling better.  Denies chest pain. Feels breathing has improved and close to her baseline.  Denies abdominal pain.  Has not had a BM in several days. Denies N/V.  Objective:   Filed Vitals:   01/15/16 0030 01/15/16 0450 01/15/16 0804 01/15/16 1232  BP: 132/71 146/79 119/55 91/71  Pulse: 92 88 97 90  Temp: 98.2 F (36.8 C) 98.5 F (36.9 C) 98.7 F (37.1 C) 98.1 F (36.7 C)  TempSrc: Oral Oral Oral Oral  Resp: 20 20 20 20   Height:      Weight:  116.8 kg (257 lb 8 oz)    SpO2: 95% 99% 97% 96%    Wt Readings from Last 3 Encounters:  01/15/16 116.8 kg (257 lb 8 oz)  06/14/15 118.389 kg (261 lb)  05/17/15 112.674 kg (248 lb 6.4 oz)     Intake/Output Summary (Last 24 hours) at 01/15/16 1340 Last data filed at 01/15/16 0853  Gross per 24 hour  Intake    780 ml  Output   1500 ml  Net   -720 ml    Exam  General: Well developed, well nourished, NAD, appears stated age  73: NCAT, mucous membranes moist.   Cardiovascular: S1 S2 auscultated, RRR  Respiratory: Clear to auscultation bilaterally with equal chest rise  Abdomen: Soft, obese, nontender, nondistended, + bowel sounds  Extremities: warm dry without cyanosis clubbing or edema. "knot" in LUE ext antecubital fossa  Neuro: AAOx3, nonfocal  Psych: Normal affect and demeanor  Data Review   Micro Results Recent Results (from the past 240 hour(s))  MRSA PCR Screening     Status: None   Collection Time: 01/11/16  6:58 PM  Result Value Ref Range Status   MRSA by PCR NEGATIVE NEGATIVE Final    Comment:        The GeneXpert MRSA Assay (FDA approved for NASAL specimens only), is one component of a comprehensive MRSA colonization surveillance program. It is not intended to diagnose MRSA infection nor to guide or monitor treatment for MRSA infections.     Radiology Reports Dg Chest 2  View  01/13/2016  CLINICAL DATA:  Chest pain radiating to back. Shortness of breath for 3 days. Congestive heart failure. Asthma. EXAM: CHEST  2 VIEW COMPARISON:  01/11/2016 FINDINGS: Moderate cardiomegaly stable. Prior CABG again noted. Decreased diffuse interstitial infiltrates seen, consistent with resolving pulmonary edema. No evidence of pulmonary consolidation or pleural effusion. IMPRESSION: Interval decrease in diffuse interstitial edema pattern. Stable cardiomegaly. Electronically Signed   By: Earle Gell M.D.   On:  01/13/2016 14:35   Dg Chest Portable 1 View  01/11/2016  CLINICAL DATA:  Initial evaluation for acute shortness of breath. EXAM: PORTABLE CHEST 1 VIEW COMPARISON:  Prior study from 05/12/2015. FINDINGS: Median sternotomy wires underlying CABG markers again noted, stable. Moderate cardiomegaly is present. Mediastinal silhouette within normal limits. Lungs are normally inflated. Defibrillator pad overlies the left chest. Moderate diffuse pulmonary edema present. No significant pleural effusion on this single frontal projection. No definite focal infiltrates. No pneumothorax. No acute osseous abnormality. IMPRESSION: Cardiomegaly with moderate diffuse pulmonary edema, consistent with acute CHF exacerbation. Electronically Signed   By: Jeannine Boga M.D.   On: 01/11/2016 03:18    CBC  Recent Labs Lab 01/11/16 0225 01/12/16 0319 01/13/16 0600 01/14/16 0427 01/15/16 0603  WBC 12.4* 9.0 7.2 6.9 7.9  HGB 9.3* 8.1* 7.5* 7.8* 8.3*  HCT 32.2* 26.9* 24.7* 26.6* 27.9*  PLT 462* 356 304 354 371  MCV 70.8* 70.1* 69.6* 68.9* 69.4*  MCH 20.4* 21.1* 21.1* 20.2* 20.6*  MCHC 28.9* 30.1 30.4 29.3* 29.7*  RDW 19.8* 19.6* 19.8* 19.7* 19.8*    Chemistries   Recent Labs Lab 01/11/16 0225 01/12/16 0319 01/12/16 1655 01/13/16 0600 01/14/16 0427 01/15/16 0603  NA 144 142  --  139 138 137  K 3.2* 3.3* 4.4 4.2 4.3 5.5*  CL 107 107  --  104 102 99*  CO2 22 25  --  24 27 28    GLUCOSE 296* 119*  --  166* 111* 124*  BUN 20 25*  --  33* 32* 36*  CREATININE 1.77* 1.97*  --  2.13* 2.19* 2.15*  CALCIUM 9.2 8.7*  --  9.0 8.9 9.1  MG  --   --  2.0 2.0 2.0  --   AST 16  --   --   --   --  16  ALT 9*  --   --   --   --  12*  ALKPHOS 111  --   --   --   --  92  BILITOT 0.8  --   --   --   --  0.7   ------------------------------------------------------------------------------------------------------------------ estimated creatinine clearance is 35 mL/min (by C-G formula based on Cr of 2.15). ------------------------------------------------------------------------------------------------------------------ No results for input(s): HGBA1C in the last 72 hours. ------------------------------------------------------------------------------------------------------------------ No results for input(s): CHOL, HDL, LDLCALC, TRIG, CHOLHDL, LDLDIRECT in the last 72 hours. ------------------------------------------------------------------------------------------------------------------ No results for input(s): TSH, T4TOTAL, T3FREE, THYROIDAB in the last 72 hours.  Invalid input(s): FREET3 ------------------------------------------------------------------------------------------------------------------  Recent Labs  01/15/16 0603  VITAMINB12 305  FOLATE 10.3  FERRITIN 26  TIBC 342  IRON 18*  RETICCTPCT 2.0    Coagulation profile  Recent Labs Lab 01/11/16 0225  INR 1.19    No results for input(s): DDIMER in the last 72 hours.  Cardiac Enzymes  Recent Labs Lab 01/12/16 1850 01/13/16 0008 01/13/16 0600  TROPONINI 1.05* 0.89* 0.85*   ------------------------------------------------------------------------------------------------------------------ Invalid input(s): POCBNP    Azrael Huss D.O. on 01/15/2016 at 1:40 PM  Between 7am to 7pm - Pager - [phone]  After 7pm go to [email or web address] - password [email or web address]  And look for the night coverage person  covering for me after hours  [facility] Office  [phone]

## 2016-01-15 NOTE — Clinical Social Work Note (Signed)
Clinical Social Work Assessment  Patient Details  Name: [name] [name] MRN: [id number] Date of Birth: 07/30/1951  Date of referral:  01/15/16               Reason for consult:  Facility Placement                Permission sought to share information with:  Guardian Permission granted to share information::  Yes, Verbal Permission Granted  Name::        Agency::     Relationship::     Contact Information:     Housing/Transportation Living arrangements for the past 2 months:  Single Family Home Source of Information:  Patient Patient Interpreter Needed:  None Criminal Activity/Legal Involvement Pertinent to Current Situation/Hospitalization:  No - Comment as needed Significant Relationships:  Significant Other (her boyfriend) Lives with:  Significant Other Do you feel safe going back to the place where you live?  Yes Need for family participation in patient care:  No (Coment)  Care giving concerns: Patient lives with boyfriend and has a nurse aid that comes to the home. PT recommending SNF   Social Worker assessment / plan:  BSW intern met with patient at patient bedside to discuss facility placement. Patient stated that she did not want to go to SNF and that she has home health set up at home ([facility]). Patient stated that she has a nurse aid that provide 24 hour assistance with the help and support from her boyfriend.  Patient stated that she has been to [facility] in the past after her open heart surgery in 2013.  Employment status:  Disabled (Comment on whether or not currently receiving Disability) (heart surgery) Insurance information:  Medicare PT Recommendations:  Skilled Nursing Facility Information / Referral to community resources:  Acute Rehab  Patient/Family's Response to care:  Patient was not agreeable to SNF. Patient is agreeable with returning home since she has home health set up.  Patient/Family's Understanding of and Emotional Response  to Diagnosis, Current Treatment, and Prognosis:  Patient is very understanding of current status and does not have any concerns to be addressed at this time.  Emotional Assessment Appearance:  Appears stated age Attitude/Demeanor/Rapport:  Other (welcoming, caring, nice) Affect (typically observed):  Calm, Pleasant Orientation:  Oriented to Self, Oriented to Place, Oriented to  Time, Oriented to Situation Alcohol / Substance use:  Tobacco Use (former smoker hasnt had a cig since thursday) Psych involvement (Current and /or in the community):     Discharge Needs  Concerns to be addressed:  No discharge needs identified Readmission within the last 30 days:  No Current discharge risk:  None Barriers to Discharge:  No Barriers Identified   Charlean McNeill, Student-SW 01/15/2016, 9:40 AM   I have reviewed this assessment and am in agreement with its findings.    Jenna Holoman, LCSWA Clinical Social Worker [phone]  

## 2016-01-15 NOTE — Care Management Note (Signed)
Case Management Note  Patient Details  Name: [name] [name] MRN: [id number] Date of Birth: 07/26/1951  Action/Plan: Patient was seen by previous CM. Patient is refusing SNF placement and request to go home at discharge. HHC choice offered Disease Management Program for CHF, patient chose [facility] services. Mary with [facility] called for arrangements.   Expected Discharge Date:    possibly 01/17/2016              Expected Discharge Plan:  [place]  In-House Referral:  Clinical Social Work  Discharge planning Services  CM Consult   Choice offered to:  Patient  HH Arranged:  RN, Disease Management, PT [place] Agency:  [facility]  Status of Service:  In process, will continue to follow  Medicare Important Message Given:  Yes  Sherrilyn Rist [phone] 01/15/2016, 1:38 PM

## 2016-01-15 NOTE — Progress Notes (Deleted)
Patient: [name] [name] / Admit Date: 01/11/2016 / Date of Encounter: 01/15/2016, 10:42 AM   Subjective: She is feeling better - less SOB, no orthopnea. Compared to feeling 100%, she says she feels about 60% of the way there. Still concerned about making the trek up the stairs to her apartment.   Objective: Telemetry: NSR, 9 beats NSVT Physical Exam: Blood pressure 119/55, pulse 97, temperature 98.7 F (37.1 C), temperature source Oral, resp. rate 20, height 5\' 8"  (1.727 m), weight 257 lb 8 oz (116.8 kg), SpO2 97 %. General: Well developed obese AAF in no acute distress. Laying about 20 degrees in bed without any SOB Head: Normocephalic, atraumatic, sclera non-icteric, no xanthomas, nares are without discharge. Neck: Negative for carotid bruits. JVP difficult to assess given thicker neck habitus. Lungs: Scattered rhonchi which are cleared with coughing - mildly diminished BS throughout.  Breathing is unlabored. Heart: RRR S1 S2 without murmurs, rubs, or gallops.  Abdomen: Soft, non-tender, non-distended with normoactive bowel sounds. No rebound/guarding. Extremities: No clubbing or cyanosis. No edema. Distal pedal pulses are 2+ and equal bilaterally. Neuro: Alert and oriented X 3. Moves all extremities spontaneously. Psych:  Responds to questions appropriately with a normal affect.   Intake/Output Summary (Last 24 hours) at 01/15/16 1042 Last data filed at 01/15/16 0853  Gross per 24 hour  Intake    900 ml  Output   2350 ml  Net  -1450 ml    Inpatient Medications:  . aspirin EC  81 mg Oral Daily  . brinzolamide  1 drop Both Eyes TID  . buPROPion  150 mg Oral BID  . furosemide  80 mg Oral BID  . gabapentin  300 mg Oral QHS  . heparin subcutaneous  5,000 Units Subcutaneous 3 times per day  . hydrALAZINE  50 mg Oral 3 times per day  . insulin aspart  0-15 Units Subcutaneous TID WC  . insulin detemir  10 Units Subcutaneous QHS  . isosorbide mononitrate  30 mg Oral Daily  .  latanoprost  1 drop Both Eyes QHS  . loratadine  10 mg Oral Daily  . mometasone-formoterol  2 puff Inhalation BID  . pravastatin  40 mg Oral QHS  . traMADol  50 mg Oral TID   Infusions:    Labs:  Recent Labs  01/13/16 0600 01/14/16 0427 01/15/16 0603  NA 139 138 137  K 4.2 4.3 5.5*  CL 104 102 99*  CO2 24 27 28   GLUCOSE 166* 111* 124*  BUN 33* 32* 36*  CREATININE 2.13* 2.19* 2.15*  CALCIUM 9.0 8.9 9.1  MG 2.0 2.0  --     Recent Labs  01/15/16 0603  AST 16  ALT 12*  ALKPHOS 92  BILITOT 0.7  PROT 6.9  ALBUMIN 3.0*    Recent Labs  01/14/16 0427 01/15/16 0603  WBC 6.9 7.9  HGB 7.8* 8.3*  HCT 26.6* 27.9*  MCV 68.9* 69.4*  PLT 354 371    Recent Labs  01/12/16 1850 01/13/16 0008 01/13/16 0600  TROPONINI 1.05* 0.89* 0.85*   Invalid input(s): POCBNP No results for input(s): HGBA1C in the last 72 hours.   Radiology/Studies:  Dg Chest 2 View  01/13/2016  CLINICAL DATA:  Chest pain radiating to back. Shortness of breath for 3 days. Congestive heart failure. Asthma. EXAM: CHEST  2 VIEW COMPARISON:  01/11/2016 FINDINGS: Moderate cardiomegaly stable. Prior CABG again noted. Decreased diffuse interstitial infiltrates seen, consistent with resolving pulmonary edema. No evidence of pulmonary consolidation or  pleural effusion. IMPRESSION: Interval decrease in diffuse interstitial edema pattern. Stable cardiomegaly. Electronically Signed   By: Earle Gell M.D.   On: 01/13/2016 14:35   Dg Chest Portable 1 View  01/11/2016  CLINICAL DATA:  Initial evaluation for acute shortness of breath. EXAM: PORTABLE CHEST 1 VIEW COMPARISON:  Prior study from 05/12/2015. FINDINGS: Median sternotomy wires underlying CABG markers again noted, stable. Moderate cardiomegaly is present. Mediastinal silhouette within normal limits. Lungs are normally inflated. Defibrillator pad overlies the left chest. Moderate diffuse pulmonary edema present. No significant pleural effusion on this single  frontal projection. No definite focal infiltrates. No pneumothorax. No acute osseous abnormality. IMPRESSION: Cardiomegaly with moderate diffuse pulmonary edema, consistent with acute CHF exacerbation. Electronically Signed   By: Jeannine Boga M.D.   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 Eats a bag of pork rinds daily. 2D Echo 01/11/16: mod LVH, EF 35-40%, diffuse HK, grade 2 DD, mild MR, mod dilated LA, RV systolic function reduced, elevated CVP.  1. Acute on chronic combined CHF - volume status improving. She reports outpatient dry weight of 250 but last weight in office was 261. Would continue oral Lasix at present dose as she continues to diurese well. May be able to decrease tomorrow and discharge home. Needs continued PT as I suspect degree of deconditioning. Per CSW notes, patient has refused SNF but is agreeable to home health. Will review endpoint for hospitalization with MD.   2. Elevated troponin due to demand ischemia with history of CAD s/p CABG 2013 - patent grafts 04/2015. Troponin felt secondary to demand ischemia in setting of CHF and cocaine use. Medical therapy recommended for now.   3. CKD stage III-IV with hyperkalemia - potassium supplement discontinued. Cr fairly stable over the last few days. May benefit from outpatient nephrology f/u.  4. Essential HTN - improved.   5. Polysubstance abuse - patient reports she has been smoking "all day long" x 51 years and quit upon admission. She also reports motivation to abstain from cocaine abuse but significant other said he didn't know how long this would last. Importance of cessation advised.  6. Severe microcytic anemia - per IM.  7.  NSVT - not on BB due to cocaine abuse. D/c potassium supplement. Mg normal yesterday. Follow on telemetry.  8. IV infiltration - will ask IM to assess to determine if abx are necessary.   Signed, Melina Copa PA-C Pager: [phone]

## 2016-01-16 DIAGNOSIS — L0291 Cutaneous abscess, unspecified: Secondary | ICD-10-CM

## 2016-01-16 DIAGNOSIS — I5021 Acute systolic (congestive) heart failure: Secondary | ICD-10-CM

## 2016-01-16 LAB — CBC
HEMATOCRIT: 28.9 % — AB (ref 36.0–46.0)
HEMOGLOBIN: 8.6 g/dL — AB (ref 12.0–15.0)
MCH: 20.8 pg — AB (ref 26.0–34.0)
MCHC: 29.8 g/dL — ABNORMAL LOW (ref 30.0–36.0)
MCV: 70 fL — ABNORMAL LOW (ref 78.0–100.0)
Platelets: 386 10*3/uL (ref 150–400)
RBC: 4.13 MIL/uL (ref 3.87–5.11)
RDW: 19.7 % — ABNORMAL HIGH (ref 11.5–15.5)
WBC: 7.5 10*3/uL (ref 4.0–10.5)

## 2016-01-16 LAB — BASIC METABOLIC PANEL
ANION GAP: 11 (ref 5–15)
BUN: 42 mg/dL — ABNORMAL HIGH (ref 6–20)
CALCIUM: 9.2 mg/dL (ref 8.9–10.3)
CO2: 29 mmol/L (ref 22–32)
Chloride: 97 mmol/L — ABNORMAL LOW (ref 101–111)
Creatinine, Ser: 2.58 mg/dL — ABNORMAL HIGH (ref 0.44–1.00)
GFR, EST AFRICAN AMERICAN: 21 mL/min — AB (ref >60)
GFR, EST NON AFRICAN AMERICAN: 18 mL/min — AB (ref >60)
Glucose, Bld: 171 mg/dL — ABNORMAL HIGH (ref 65–99)
POTASSIUM: 5 mmol/L (ref 3.5–5.1)
Sodium: 137 mmol/L (ref 135–145)

## 2016-01-16 LAB — GLUCOSE, CAPILLARY
GLUCOSE-CAPILLARY: 168 mg/dL — AB (ref 65–99)
GLUCOSE-CAPILLARY: 164 mg/dL — AB (ref 65–99)

## 2016-01-16 MED ORDER — INSULIN GLARGINE 100 UNIT/ML SOLOSTAR PEN: 25.0000 [IU] | PEN_INJECTOR | Freq: Every day | SUBCUTANEOUS | Status: DC

## 2016-01-16 MED ORDER — METOPROLOL TARTRATE 25 MG PO TABS
12.5000 mg | ORAL_TABLET | Freq: Two times a day (BID) | ORAL | Status: DC
Start: 2016-01-16 — End: 2019-04-30

## 2016-01-16 MED ORDER — BUPROPION HCL ER (SR) 150 MG PO TB12: 150.0000 mg | ORAL_TABLET | Freq: Two times a day (BID) | ORAL | Status: AC

## 2016-01-16 MED ORDER — BISACODYL 10 MG RE SUPP
10.0000 mg | Freq: Once | RECTAL | Status: AC
  Administered 2016-01-16: 10 mg via RECTAL
  Filled 2016-01-16: qty 1

## 2016-01-16 MED ORDER — ASPIRIN 81 MG PO TBEC: 81.0000 mg | DELAYED_RELEASE_TABLET | Freq: Every day | ORAL | Status: DC

## 2016-01-16 MED ORDER — FUROSEMIDE 80 MG PO TABS: 80.0000 mg | ORAL_TABLET | Freq: Every day | ORAL | Status: DC

## 2016-01-16 NOTE — Progress Notes (Signed)
All d/c instructions explained and given to pt.  Verbalized understanding.  D/c off floor via w/c to awaiting transport.  Karie Kirks, Therapist, sports.

## 2016-01-16 NOTE — Discharge Instructions (Signed)

## 2016-01-16 NOTE — Discharge Summary (Signed)
Physician Discharge Summary  [name] [name] [id number] DOB: 15-Jun-1951 DOA: 01/11/2016  PCP: Kevan Ny, MD  Admit date: 01/11/2016 Discharge date: 01/16/2016  Time spent: 45 minutes  Recommendations for Outpatient Follow-up:  Patient will be discharged to home with home health services.  Patient will need to follow up with primary care provider within one week of discharge, repeat BMP and CBC.  Follow up with cardiology.  Patient should continue medications as prescribed.  Patient should follow a heart healthy/carb modified diet with 1269ml fluid restriction per day.  Discharge Diagnoses:  Acute exacerbation of chronic systolic CHF Chest pain/elevated troponin Hypertension Hyperkalemia/hypokalemia Acute on chronic kidney disease, stage III Microcytic anemia Diabetes mellitus, type 2 Pulmonary hypertension next line substance abuse Tobacco abuse Physical deconditioning Left upper extremity thrombus  Discharge Condition: Stable  Diet recommendation: heart healthy/carb modified diet with 1297ml fluid restriction per day  [facility] Weights   01/14/16 1742 01/15/16 0450 01/16/16 0543  Weight: 120.067 kg (264 lb 11.2 oz) 116.8 kg (257 lb 8 oz) 116.9 kg (257 lb 11.5 oz)    History of present illness:  on 01/11/2016 by Dr. Jennette Kettle [name] [name] is a 65 y.o. female with h/o DM, CAD with CABG, cocaine abuse, CHF. Patient presents to the ED with acute onset gradually worsening SOB that onset 2 hours ago. Patient used cocaine on Monday for the first time in 6 months she states. She claims no chest pain after use until today. Today she developed SOB, and called EMS. She had brief episode of chest pain that lead to consultation with cardiology, NTG, and heparin being started on the patient. She is currently chest pain free. Her SOB has improved with lasix, albuterol, NTG, and Atrovent.  Hospital Course:  Acute exacerbation of chronic systolic CHF  -admission weight 126.6 kg -  currently 116.9kg - ?accuracy given variability with scales -continue maintenance diuresis and restrictive diet -Cardiology consulted and appreciated, recommended Lasix 80 mg daily -Continue to monitor intake and output, daily weight -Urine output 400 over the past 24 hours -Echocardiogram EF [id number], grade 2 diastolic dysfunction  Chest pain - Elevated troponin - CAD s/p CABG -Secondary to cocaine use and CHF - peaked at 2.35 -Cards has seen - med management only  -Cath May of last year with patent grafts -Echocardiogram as above -No ACEI due to AKI  HTN  -Currently soft, continue Lasix, hydralazine with holding parameters  Hyperkalemia/ hypokalemia -Continue to monitor BMP, potassium 5.5 today -Suspect patient will have bowel movement today along with Lasix which should resolve her hyperkalemia  Acute on CKD stage III-IV -creatinine currently 2.58 -repeat BMP in one week  Microcytic anemia, iron def  -Anemia panel: Iron 18, ferritin 26 -Will give dose of Feraheme -Hemoglobin currently 8.6 -Patient will need outpatient follow-up and workup  DM Type 2 -A1c 6.0 - CBG presently controlled -Continue Levemir, NovoLog, CBG monitoring  Pulmonary hypertension   Substance abuse -Consulted CSW for resources to help with drug abuse  Tobacco abuse -Patient states would like to discontinue smoking - wellbutrin increased to 150 mg BID  Physical deconditioning -PT consulted and recommended nursing facility, patient refused -Patient would like to return home and home health services  Left upper extremity cephalic vein thrombus -patient had IV site in left antecubital fossa -no discharge seen, however nodule palpated -LUE [place]: no DVT, acute cephalic vein thrombus -Continue warm compresses  Procedures  Echocardiogram  Consults  Cardiology  Discharge Exam: Filed Vitals:   01/16/16 [id number] 01/16/16 [id number]  BP: 130/58 129/60  Pulse: 92 96  Temp: 98.9 F (37.2 C)   Resp:  20    Exam  General: Well developed, well nourished, NAD, appears stated age  HEENT: NCAT, mucous membranes moist.   Cardiovascular: S1 S2 auscultated, RRR  Respiratory: Clear to auscultation bilaterally with equal chest rise  Abdomen: Soft, obese, nontender, nondistended, + bowel sounds  Extremities: warm dry without cyanosis clubbing or edema. "knot" in LUE ext antecubital fossa  Neuro: AAOx3, nonfocal  Psych: Normal affect and demeanor  Discharge Instructions      Discharge Instructions    Discharge instructions    Complete by:  As directed   Patient will be discharged to home with home health services.  Patient will need to follow up with primary care provider within one week of discharge, repeat BMP and CBC.  Follow up with cardiology.  Patient should continue medications as prescribed.  Patient should follow a heart healthy/carb modified diet with 1251ml fluid restriction per day.            Medication List    STOP taking these medications        lisinopril 5 MG tablet  Commonly known as:  PRINIVIL,ZESTRIL     metFORMIN 1000 MG tablet  Commonly known as:  GLUCOPHAGE      TAKE these medications        aspirin 81 MG EC tablet  Take 1 tablet (81 mg total) by mouth daily.     brinzolamide 1 % ophthalmic suspension  Commonly known as:  AZOPT  Place 1 drop into both eyes 3 (three) times daily.     buPROPion 150 MG 12 hr tablet  Commonly known as:  WELLBUTRIN SR  Take 1 tablet (150 mg total) by mouth 2 (two) times daily.     cetirizine 10 MG tablet  Commonly known as:  ZYRTEC  Take 10 mg by mouth daily as needed for allergies.     Fluticasone-Salmeterol 250-50 MCG/DOSE Aepb  Commonly known as:  ADVAIR  Inhale 1 puff into the lungs every 12 (twelve) hours.     furosemide 40 MG tablet  Commonly known as:  LASIX  Take 1 tablet (40 mg total) by mouth daily. Take additional dose if you experience weight gain.     furosemide 80 MG tablet  Commonly known  as:  LASIX  Take 1 tablet (80 mg total) by mouth daily.     gabapentin 100 MG capsule  Commonly known as:  NEURONTIN  Take 300 mg by mouth at bedtime.     glimepiride 4 MG tablet  Commonly known as:  AMARYL  Take 4 mg by mouth daily before breakfast.     Insulin Glargine 100 UNIT/ML Solostar Pen  Commonly known as:  LANTUS SOLOSTAR  Inject 25 Units into the skin daily at 10 pm.     latanoprost 0.005 % ophthalmic solution  Commonly known as:  XALATAN  Place 1 drop into both eyes at bedtime.     metoprolol tartrate 25 MG tablet  Commonly known as:  LOPRESSOR  Take 0.5 tablets (12.5 mg total) by mouth 2 (two) times daily.     pravastatin 40 MG tablet  Commonly known as:  PRAVACHOL  Take 40 mg by mouth at bedtime.     traMADol 50 MG tablet  Commonly known as:  ULTRAM  Take 50 mg by mouth 3 (three) times daily.       Allergies  Allergen Reactions  . Naproxen Rash  Follow-up Information    Follow up with Marlou Sa, ERIC, MD. Schedule an appointment as soon as possible for a visit in 1 week.   Specialty:  Internal Medicine   Why:  Hospital followup, repeat CBC and BMP   Contact information:   [place] [address] [phone]       Follow up with Murray Hodgkins, NP. Schedule an appointment as soon as possible for a visit in 1 week.   Specialties:  Nurse Practitioner, Cardiology, Radiology   Why:  Hospital followup   Contact information:   [id number] [place] [address] [phone]        The results of significant diagnostics from this hospitalization (including imaging, microbiology, ancillary and laboratory) are listed below for reference.    Significant Diagnostic Studies: Dg Chest 2 View  01/13/2016  CLINICAL DATA:  Chest pain radiating to back. Shortness of breath for 3 days. Congestive heart failure. Asthma. EXAM: CHEST  2 VIEW COMPARISON:  01/11/2016 FINDINGS: Moderate cardiomegaly stable. Prior CABG again  noted. Decreased diffuse interstitial infiltrates seen, consistent with resolving pulmonary edema. No evidence of pulmonary consolidation or pleural effusion. IMPRESSION: Interval decrease in diffuse interstitial edema pattern. Stable cardiomegaly. Electronically Signed   By: Earle Gell M.D.   On: 01/13/2016 14:35   Dg Chest Portable 1 View  01/11/2016  CLINICAL DATA:  Initial evaluation for acute shortness of breath. EXAM: PORTABLE CHEST 1 VIEW COMPARISON:  Prior study from 05/12/2015. FINDINGS: Median sternotomy wires underlying CABG markers again noted, stable. Moderate cardiomegaly is present. Mediastinal silhouette within normal limits. Lungs are normally inflated. Defibrillator pad overlies the left chest. Moderate diffuse pulmonary edema present. No significant pleural effusion on this single frontal projection. No definite focal infiltrates. No pneumothorax. No acute osseous abnormality. IMPRESSION: Cardiomegaly with moderate diffuse pulmonary edema, consistent with acute CHF exacerbation. Electronically Signed   By: Jeannine Boga M.D.   On: 01/11/2016 03:18    Microbiology: Recent Results (from the past 240 hour(s))  MRSA PCR Screening     Status: None   Collection Time: 01/11/16  6:58 PM  Result Value Ref Range Status   MRSA by PCR NEGATIVE NEGATIVE Final    Comment:        The GeneXpert MRSA Assay (FDA approved for NASAL specimens only), is one component of a comprehensive MRSA colonization surveillance program. It is not intended to diagnose MRSA infection nor to guide or monitor treatment for MRSA infections.      Labs: Basic Metabolic Panel:  Recent Labs Lab 01/12/16 0319 01/12/16 1655 01/13/16 0600 01/14/16 0427 01/15/16 0603 01/16/16 0428  NA 142  --  139 138 137 137  K 3.3* 4.4 4.2 4.3 5.5* 5.0  CL 107  --  104 102 99* 97*  CO2 25  --  24 27 28 29   GLUCOSE 119*  --  166* 111* 124* 171*  BUN 25*  --  33* 32* 36* 42*  CREATININE 1.97*  --  2.13* 2.19*  2.15* 2.58*  CALCIUM 8.7*  --  9.0 8.9 9.1 9.2  MG  --  2.0 2.0 2.0  --   --    Liver Function Tests:  Recent Labs Lab 01/11/16 0225 01/15/16 0603  AST 16 16  ALT 9* 12*  ALKPHOS 111 92  BILITOT 0.8 0.7  PROT 7.8 6.9  ALBUMIN 3.3* 3.0*   No results for input(s): LIPASE, AMYLASE in the last 168 hours. No results for input(s): AMMONIA in  the last 168 hours. CBC:  Recent Labs Lab 01/12/16 0319 01/13/16 0600 01/14/16 0427 01/15/16 0603 01/16/16 0428  WBC 9.0 7.2 6.9 7.9 7.5  HGB 8.1* 7.5* 7.8* 8.3* 8.6*  HCT 26.9* 24.7* 26.6* 27.9* 28.9*  MCV 70.1* 69.6* 68.9* 69.4* 70.0*  PLT 356 304 354 371 386   Cardiac Enzymes:  Recent Labs Lab 01/11/16 0922 01/11/16 1500 01/12/16 1850 01/13/16 0008 01/13/16 0600  TROPONINI 1.02* 2.35* 1.05* 0.89* 0.85*   BNP: BNP (last 3 results)  Recent Labs  05/12/15 0404 05/14/15 0312 01/11/16 1139  BNP 439.6* 471.1* 1097.7*    ProBNP (last 3 results) No results for input(s): PROBNP in the last 8760 hours.  CBG:  Recent Labs Lab 01/15/16 0557 01/15/16 1157 01/15/16 1734 01/15/16 2122 01/16/16 0603  GLUCAP 132* 200* 187* 156* 168*       Signed:  Milah Recht  [facility] 01/16/2016, 9:56 AM

## 2016-01-16 NOTE — Progress Notes (Signed)
OT Cancellation Note  Patient Details Name: JAMECA MORK MRN: [id number] DOB: 1951/04/05   Cancelled Treatment:     Reason treatment not performed/pt not seen: Pt has d/c summary in chart, acute OT goals were reviewed with pt and pt denies needs at this time/declined treatment today. "I'm just waiting on my family, I don't need that" (re:OT). Pt states that she will have PRN assist at discharge and reports no further acute OT needs at this time despite verbal encouragement to participate. Will sign off due to pt request and pending discharge today.   Carlynn Herald Amy Beth Dixon, OTR/L 01/16/2016, 11:16 AM

## 2016-01-16 NOTE — Progress Notes (Signed)
Pt requesting to have lantus refill.  Notified Dr. Ree Kida and she instructed pt to have med refill through her primary MD.  Attempted to reach pt to inform her to contact primary MD for lantus.  Karie Kirks, Therapist, sports.

## 2016-01-28 ENCOUNTER — Ambulatory Visit: Payer: Medicare Other | Admitting: Physician Assistant

## 2016-02-05 ENCOUNTER — Ambulatory Visit (INDEPENDENT_AMBULATORY_CARE_PROVIDER_SITE_OTHER): Payer: Medicare Other | Admitting: Cardiology

## 2016-02-05 ENCOUNTER — Encounter: Payer: Self-pay | Admitting: Cardiology

## 2016-02-05 VITALS — BP 110/60 | HR 60 | Ht 68.0 in | Wt 267.0 lb

## 2016-02-05 DIAGNOSIS — I25810 Atherosclerosis of coronary artery bypass graft(s) without angina pectoris: Secondary | ICD-10-CM | POA: Diagnosis not present

## 2016-02-05 DIAGNOSIS — I5042 Chronic combined systolic (congestive) and diastolic (congestive) heart failure: Secondary | ICD-10-CM | POA: Diagnosis not present

## 2016-02-05 DIAGNOSIS — F1410 Cocaine abuse, uncomplicated: Secondary | ICD-10-CM | POA: Diagnosis not present

## 2016-02-05 DIAGNOSIS — N184 Chronic kidney disease, stage 4 (severe): Secondary | ICD-10-CM

## 2016-02-05 DIAGNOSIS — D508 Other iron deficiency anemias: Secondary | ICD-10-CM

## 2016-02-05 DIAGNOSIS — E1121 Type 2 diabetes mellitus with diabetic nephropathy: Secondary | ICD-10-CM

## 2016-02-05 DIAGNOSIS — Z794 Long term (current) use of insulin: Secondary | ICD-10-CM

## 2016-02-05 DIAGNOSIS — I214 Non-ST elevation (NSTEMI) myocardial infarction: Secondary | ICD-10-CM

## 2016-02-05 DIAGNOSIS — I10 Essential (primary) hypertension: Secondary | ICD-10-CM

## 2016-02-05 DIAGNOSIS — Z951 Presence of aortocoronary bypass graft: Secondary | ICD-10-CM

## 2016-02-05 DIAGNOSIS — D509 Iron deficiency anemia, unspecified: Secondary | ICD-10-CM | POA: Insufficient documentation

## 2016-02-05 NOTE — Assessment & Plan Note (Addendum)
CABG x 3 with LIMA-LAD, SVG-OM1, SVG-CFX complicated by sternal wound infection requiring debridement Aug 2013 Cath May 2016 patent grafts

## 2016-02-05 NOTE — Assessment & Plan Note (Signed)
Admitted cocaine use prior to adm Jan 2017. None since

## 2016-02-05 NOTE — Assessment & Plan Note (Signed)
Hospitalized 01/12/16-01/16/16. Currently stable

## 2016-02-05 NOTE — Assessment & Plan Note (Signed)
Suspect this is from [facility]

## 2016-02-05 NOTE — Assessment & Plan Note (Signed)
EF 35-40% by echo Jan 2017

## 2016-02-05 NOTE — Assessment & Plan Note (Signed)
Right internal capsule stroke in 12/2006. Chronic Lt leg weakness, requires PT and a cane

## 2016-02-05 NOTE — Assessment & Plan Note (Signed)
BMI 40, denies poor sleep pattern

## 2016-02-05 NOTE — Assessment & Plan Note (Signed)
Controlled.  

## 2016-02-05 NOTE — Assessment & Plan Note (Signed)
GFR 21, SCr 2.58

## 2016-02-05 NOTE — Assessment & Plan Note (Signed)
Troponin 2.5 felt to be secondary to demand ischemia

## 2016-02-05 NOTE — Patient Instructions (Signed)
Medication Instructions:   NO CHANGE  Labwork:  Your physician recommends that you HAVE LAB WORK TODAY  Follow-Up:  Your physician wants you to follow-up in: 6 MONTHS WITH DR Martinique You will receive a reminder letter in the mail two months in advance. If you don't receive a letter, please call our office to schedule the follow-up appointment.   REFERRAL TO NEPHROLOGY FOR STAGE 4 CHRONIC KIDNEY DISEASE, DM,ANEMIA

## 2016-02-05 NOTE — Progress Notes (Signed)
02/05/2016 [name] [name]   [birth year]  PO:[id number]  Primary Physician Marlou Sa, ERIC, MD Primary Cardiologist: Dr Martinique  HPI:  64 y/o obese AA female with a history of CAD, s/p CABG x 3 July 0000000 complicated by sternal wound infection requiring debridement in Aug 2013. The pt had a cath in May 2016 that showed patent grafts. Her EF then was 40-45%. She has multiple other medical problems. She has DM, CRI, morbid obesity, a h/o CVA 2008 with chronic LLE weakness, and has struggled with smoking and cocaine use. She lives alone with a Bland now coming twice a week and a physical therapist. She was admitted in Jan with acute on chronic systolic CHF. She admitted to using cocaine prior to this admission. She also had been eating pork rinds. Her Troponin went to 2.5, Dr Martinique felt this was demand ischemia. She had an echo that did show a drop in her EF to 35-40%. She is in the office today for follow up. She says she has been doing better, no SOB or chest pain. She has "cut way back" on her smoking and says she has not used any cocaine.    Current Outpatient Prescriptions  Medication Sig Dispense Refill  . aspirin EC 81 MG EC tablet Take 1 tablet (81 mg total) by mouth daily. 30 tablet 0  . brinzolamide (AZOPT) 1 % ophthalmic suspension Place 1 drop into both eyes 3 (three) times daily.    Marland Kitchen buPROPion (WELLBUTRIN SR) 150 MG 12 hr tablet Take 1 tablet (150 mg total) by mouth 2 (two) times daily. 60 tablet 0  . cetirizine (ZYRTEC) 10 MG tablet Take 10 mg by mouth daily as needed for allergies.     . Fluticasone-Salmeterol (ADVAIR) 250-50 MCG/DOSE AEPB Inhale 1 puff into the lungs every 12 (twelve) hours.    . furosemide (LASIX) 40 MG tablet Take 1 tablet (40 mg total) by mouth daily. Take additional dose if you experience weight gain. 45 tablet 0  . furosemide (LASIX) 80 MG tablet Take 1 tablet (80 mg total) by mouth daily. 30 tablet 0  . gabapentin (NEURONTIN) 300 MG capsule 300 mg at bedtime.    Marland Kitchen  glimepiride (AMARYL) 4 MG tablet Take 4 mg by mouth daily before breakfast.    . Insulin Glargine (LANTUS SOLOSTAR) 100 UNIT/ML Solostar Pen Inject 25 Units into the skin daily at 10 pm. 15 mL 0  . latanoprost (XALATAN) 0.005 % ophthalmic solution Place 1 drop into both eyes at bedtime.    . metoprolol tartrate (LOPRESSOR) 25 MG tablet Take 0.5 tablets (12.5 mg total) by mouth 2 (two) times daily. 30 tablet 0  . pravastatin (PRAVACHOL) 40 MG tablet Take 40 mg by mouth at bedtime.    . traMADol (ULTRAM) 50 MG tablet Take 50 mg by mouth 3 (three) times daily.      Current Facility-Administered Medications  Medication Dose Route Frequency Provider Last Rate Last Dose  . loratadine (CLARITIN) tablet 10 mg  10 mg Oral Daily Ivin Poot, MD        Allergies  Allergen Reactions  . Naproxen Rash    Social History   Social History  . Marital Status: Single    Spouse Name: N/A  . Number of Children: N/A  . Years of Education: N/A   Occupational History  . Not on file.   Social History Main Topics  . Smoking status: Current Some Day Smoker -- 0.25 packs/day  . Smokeless tobacco: Not  on file  . Alcohol Use: No  . Drug Use: Yes    Special: Cocaine  . Sexual Activity: No   Other Topics Concern  . Not on file   Social History Narrative   Lives with boyfriend of 20 years.      Review of Systems: General: negative for chills, fever, night sweats or weight changes.  Cardiovascular: negative for chest pain, dyspnea on exertion, edema, orthopnea, palpitations, paroxysmal nocturnal dyspnea or shortness of breath Dermatological: negative for rash Respiratory: negative for cough or wheezing Urologic: negative for hematuria Abdominal: negative for nausea, vomiting, diarrhea, bright red blood per rectum, melena, or hematemesis Neurologic: negative for visual changes, syncope, or dizziness All other systems reviewed and are otherwise negative except as noted above.    Blood pressure  110/60, pulse 60, height 5\' 8"  (1.727 m), weight 267 lb (121.11 kg).  General appearance: alert, cooperative, no distress and morbidly obese Neck: no carotid bruit and no JVD Lungs: clear to auscultation bilaterally Heart: regular rate and rhythm Extremities: trace edema Skin: Skin color, texture, turgor normal. No rashes or lesions Neurologic: Grossly normal  EKG NSR- 64, TWI lead 1, AVL and Q V2- old findings  ASSESSMENT AND PLAN:   Acute on chronic combined systolic and diastolic congestive heart failure ([place]) Hospitalized 01/12/16-01/16/16. Currently stable  CKD (chronic kidney disease) stage 4, GFR 15-29 ml/min (HCC) GFR 21, SCr 2.58  Chronic combined systolic and diastolic CHF (congestive heart failure) ([place]) EF 35-40% by echo Jan 2017  Cocaine abuse Admitted cocaine use prior to adm Jan 2017. None since  NSTEMI (non-ST elevated myocardial infarction) Troponin 2.5 felt to be secondary to demand ischemia  S/P CABG (coronary artery bypass graft) CABG x 3 with LIMA-LAD, SVG-OM1, SVG-CFX complicated by sternal wound infection requiring debridement Aug 2013 Cath May 2016 patent grafts  Type 2 diabetes mellitus with renal manifestations, controlled ([place]) On insulin and oral agents  Morbid obesity BMI 40, denies poor sleep pattern  History of CVA (cerebrovascular accident) Right internal capsule stroke in 12/2006. Chronic Lt leg weakness, requires PT and a cane  Essential hypertension Controlled  Anemia Suspect this is from [place]  I did not change Ms Yaffe's medication. I ordered a BMP and CBC. I suggest she get established with a Nephrologist as she has anemia (Hgb 8.6) and stage 4 CRI with DM, CHF, and CAD. We can see her in 6 months or sooner if she has problems.   Erlene Quan PA-C 02/05/2016 12:41 PM

## 2016-02-05 NOTE — Assessment & Plan Note (Signed)
On insulin and oral agents

## 2016-02-07 LAB — BASIC METABOLIC PANEL
BUN: 59 mg/dL — ABNORMAL HIGH (ref 7–25)
CO2: 22 mmol/L (ref 20–31)
Calcium: 9.1 mg/dL (ref 8.6–10.4)
Chloride: 109 mmol/L (ref 98–110)
Creat: 2.22 mg/dL — ABNORMAL HIGH (ref 0.50–0.99)
Glucose, Bld: 88 mg/dL (ref 65–99)
Potassium: 4.5 mmol/L (ref 3.5–5.3)
Sodium: 143 mmol/L (ref 135–146)

## 2016-02-08 LAB — CBC
HCT: 32 % — ABNORMAL LOW (ref 36.0–46.0)
Hemoglobin: 9.8 g/dL — ABNORMAL LOW (ref 12.0–15.0)
MCH: 22.7 pg — ABNORMAL LOW (ref 26.0–34.0)
MCHC: 30.6 g/dL (ref 30.0–36.0)
MCV: 74.1 fL — ABNORMAL LOW (ref 78.0–100.0)
MPV: 8.7 fL (ref 8.6–12.4)
Platelets: 265 10*3/uL (ref 150–400)
RBC: 4.32 MIL/uL (ref 3.87–5.11)
RDW: 24.3 % — ABNORMAL HIGH (ref 11.5–15.5)
WBC: 5.9 10*3/uL (ref 4.0–10.5)

## 2016-02-12 ENCOUNTER — Ambulatory Visit: Payer: Medicare Other | Admitting: Physician Assistant

## 2016-02-12 ENCOUNTER — Telehealth: Payer: Self-pay | Admitting: Cardiology

## 2016-02-12 NOTE — Telephone Encounter (Signed)
Called patient and left voicemail regarding Dr. Brett Albino appt at [facility] on 02-18-16 at 11:15.

## 2016-02-18 DIAGNOSIS — A599 Trichomoniasis, unspecified: Secondary | ICD-10-CM | POA: Diagnosis not present

## 2016-02-18 DIAGNOSIS — E119 Type 2 diabetes mellitus without complications: Secondary | ICD-10-CM | POA: Diagnosis not present

## 2016-02-18 DIAGNOSIS — D631 Anemia in chronic kidney disease: Secondary | ICD-10-CM | POA: Diagnosis not present

## 2016-02-18 DIAGNOSIS — R3912 Poor urinary stream: Secondary | ICD-10-CM | POA: Diagnosis not present

## 2016-02-18 DIAGNOSIS — I2510 Atherosclerotic heart disease of native coronary artery without angina pectoris: Secondary | ICD-10-CM | POA: Diagnosis not present

## 2016-02-18 DIAGNOSIS — Z720 Tobacco use: Secondary | ICD-10-CM | POA: Diagnosis not present

## 2016-02-18 DIAGNOSIS — I10 Essential (primary) hypertension: Secondary | ICD-10-CM | POA: Diagnosis not present

## 2016-02-18 DIAGNOSIS — N184 Chronic kidney disease, stage 4 (severe): Secondary | ICD-10-CM | POA: Diagnosis not present

## 2016-03-17 DIAGNOSIS — D509 Iron deficiency anemia, unspecified: Secondary | ICD-10-CM | POA: Diagnosis not present

## 2016-03-17 DIAGNOSIS — M19012 Primary osteoarthritis, left shoulder: Secondary | ICD-10-CM | POA: Diagnosis not present

## 2016-03-17 DIAGNOSIS — I5042 Chronic combined systolic (congestive) and diastolic (congestive) heart failure: Secondary | ICD-10-CM | POA: Diagnosis not present

## 2016-03-17 DIAGNOSIS — I272 Other secondary pulmonary hypertension: Secondary | ICD-10-CM | POA: Diagnosis not present

## 2016-03-17 DIAGNOSIS — I2510 Atherosclerotic heart disease of native coronary artery without angina pectoris: Secondary | ICD-10-CM | POA: Diagnosis not present

## 2016-03-17 DIAGNOSIS — E1151 Type 2 diabetes mellitus with diabetic peripheral angiopathy without gangrene: Secondary | ICD-10-CM | POA: Diagnosis not present

## 2016-03-17 DIAGNOSIS — I130 Hypertensive heart and chronic kidney disease with heart failure and stage 1 through stage 4 chronic kidney disease, or unspecified chronic kidney disease: Secondary | ICD-10-CM | POA: Diagnosis not present

## 2016-03-17 DIAGNOSIS — N183 Chronic kidney disease, stage 3 (moderate): Secondary | ICD-10-CM | POA: Diagnosis not present

## 2016-03-17 DIAGNOSIS — E1122 Type 2 diabetes mellitus with diabetic chronic kidney disease: Secondary | ICD-10-CM | POA: Diagnosis not present

## 2016-03-19 DIAGNOSIS — I2510 Atherosclerotic heart disease of native coronary artery without angina pectoris: Secondary | ICD-10-CM | POA: Diagnosis not present

## 2016-03-19 DIAGNOSIS — E1122 Type 2 diabetes mellitus with diabetic chronic kidney disease: Secondary | ICD-10-CM | POA: Diagnosis not present

## 2016-03-19 DIAGNOSIS — I130 Hypertensive heart and chronic kidney disease with heart failure and stage 1 through stage 4 chronic kidney disease, or unspecified chronic kidney disease: Secondary | ICD-10-CM | POA: Diagnosis not present

## 2016-03-19 DIAGNOSIS — I5042 Chronic combined systolic (congestive) and diastolic (congestive) heart failure: Secondary | ICD-10-CM | POA: Diagnosis not present

## 2016-03-19 DIAGNOSIS — N183 Chronic kidney disease, stage 3 (moderate): Secondary | ICD-10-CM | POA: Diagnosis not present

## 2016-03-19 DIAGNOSIS — D509 Iron deficiency anemia, unspecified: Secondary | ICD-10-CM | POA: Diagnosis not present

## 2016-03-21 DIAGNOSIS — N183 Chronic kidney disease, stage 3 (moderate): Secondary | ICD-10-CM | POA: Diagnosis not present

## 2016-03-21 DIAGNOSIS — I2510 Atherosclerotic heart disease of native coronary artery without angina pectoris: Secondary | ICD-10-CM | POA: Diagnosis not present

## 2016-03-21 DIAGNOSIS — D509 Iron deficiency anemia, unspecified: Secondary | ICD-10-CM | POA: Diagnosis not present

## 2016-03-21 DIAGNOSIS — E1122 Type 2 diabetes mellitus with diabetic chronic kidney disease: Secondary | ICD-10-CM | POA: Diagnosis not present

## 2016-03-21 DIAGNOSIS — I5042 Chronic combined systolic (congestive) and diastolic (congestive) heart failure: Secondary | ICD-10-CM | POA: Diagnosis not present

## 2016-03-21 DIAGNOSIS — I130 Hypertensive heart and chronic kidney disease with heart failure and stage 1 through stage 4 chronic kidney disease, or unspecified chronic kidney disease: Secondary | ICD-10-CM | POA: Diagnosis not present

## 2016-03-24 DIAGNOSIS — I5042 Chronic combined systolic (congestive) and diastolic (congestive) heart failure: Secondary | ICD-10-CM | POA: Diagnosis not present

## 2016-03-24 DIAGNOSIS — E1122 Type 2 diabetes mellitus with diabetic chronic kidney disease: Secondary | ICD-10-CM | POA: Diagnosis not present

## 2016-03-24 DIAGNOSIS — I130 Hypertensive heart and chronic kidney disease with heart failure and stage 1 through stage 4 chronic kidney disease, or unspecified chronic kidney disease: Secondary | ICD-10-CM | POA: Diagnosis not present

## 2016-03-24 DIAGNOSIS — I2510 Atherosclerotic heart disease of native coronary artery without angina pectoris: Secondary | ICD-10-CM | POA: Diagnosis not present

## 2016-03-24 DIAGNOSIS — D509 Iron deficiency anemia, unspecified: Secondary | ICD-10-CM | POA: Diagnosis not present

## 2016-03-24 DIAGNOSIS — N183 Chronic kidney disease, stage 3 (moderate): Secondary | ICD-10-CM | POA: Diagnosis not present

## 2016-03-26 DIAGNOSIS — D509 Iron deficiency anemia, unspecified: Secondary | ICD-10-CM | POA: Diagnosis not present

## 2016-03-26 DIAGNOSIS — I5042 Chronic combined systolic (congestive) and diastolic (congestive) heart failure: Secondary | ICD-10-CM | POA: Diagnosis not present

## 2016-03-26 DIAGNOSIS — E1122 Type 2 diabetes mellitus with diabetic chronic kidney disease: Secondary | ICD-10-CM | POA: Diagnosis not present

## 2016-03-26 DIAGNOSIS — N183 Chronic kidney disease, stage 3 (moderate): Secondary | ICD-10-CM | POA: Diagnosis not present

## 2016-03-26 DIAGNOSIS — I2510 Atherosclerotic heart disease of native coronary artery without angina pectoris: Secondary | ICD-10-CM | POA: Diagnosis not present

## 2016-03-26 DIAGNOSIS — I130 Hypertensive heart and chronic kidney disease with heart failure and stage 1 through stage 4 chronic kidney disease, or unspecified chronic kidney disease: Secondary | ICD-10-CM | POA: Diagnosis not present

## 2016-04-17 ENCOUNTER — Emergency Department (HOSPITAL_COMMUNITY): Payer: Medicare Other

## 2016-04-17 ENCOUNTER — Observation Stay (HOSPITAL_COMMUNITY)
Admission: EM | Admit: 2016-04-17 | Discharge: 2016-04-21 | Disposition: A | Discharge status: Home / Self Care | Payer: Medicare Other | Attending: Internal Medicine | Admitting: Internal Medicine

## 2016-04-17 ENCOUNTER — Encounter (HOSPITAL_COMMUNITY): Payer: Self-pay

## 2016-04-17 DIAGNOSIS — K5520 Angiodysplasia of colon without hemorrhage: Secondary | ICD-10-CM | POA: Diagnosis not present

## 2016-04-17 DIAGNOSIS — I129 Hypertensive chronic kidney disease with stage 1 through stage 4 chronic kidney disease, or unspecified chronic kidney disease: Secondary | ICD-10-CM | POA: Insufficient documentation

## 2016-04-17 DIAGNOSIS — D649 Anemia, unspecified: Secondary | ICD-10-CM

## 2016-04-17 DIAGNOSIS — F17200 Nicotine dependence, unspecified, uncomplicated: Secondary | ICD-10-CM | POA: Diagnosis not present

## 2016-04-17 DIAGNOSIS — E119 Type 2 diabetes mellitus without complications: Secondary | ICD-10-CM

## 2016-04-17 DIAGNOSIS — Z8673 Personal history of transient ischemic attack (TIA), and cerebral infarction without residual deficits: Secondary | ICD-10-CM | POA: Diagnosis not present

## 2016-04-17 DIAGNOSIS — E1121 Type 2 diabetes mellitus with diabetic nephropathy: Secondary | ICD-10-CM

## 2016-04-17 DIAGNOSIS — Z951 Presence of aortocoronary bypass graft: Secondary | ICD-10-CM | POA: Diagnosis not present

## 2016-04-17 DIAGNOSIS — I248 Other forms of acute ischemic heart disease: Secondary | ICD-10-CM | POA: Insufficient documentation

## 2016-04-17 DIAGNOSIS — K219 Gastro-esophageal reflux disease without esophagitis: Secondary | ICD-10-CM | POA: Insufficient documentation

## 2016-04-17 DIAGNOSIS — E1129 Type 2 diabetes mellitus with other diabetic kidney complication: Secondary | ICD-10-CM

## 2016-04-17 DIAGNOSIS — E1122 Type 2 diabetes mellitus with diabetic chronic kidney disease: Secondary | ICD-10-CM | POA: Diagnosis present

## 2016-04-17 DIAGNOSIS — N186 End stage renal disease: Secondary | ICD-10-CM | POA: Diagnosis present

## 2016-04-17 DIAGNOSIS — E785 Hyperlipidemia, unspecified: Secondary | ICD-10-CM | POA: Insufficient documentation

## 2016-04-17 DIAGNOSIS — I2510 Atherosclerotic heart disease of native coronary artery without angina pectoris: Secondary | ICD-10-CM | POA: Diagnosis not present

## 2016-04-17 DIAGNOSIS — Z7982 Long term (current) use of aspirin: Secondary | ICD-10-CM | POA: Diagnosis not present

## 2016-04-17 DIAGNOSIS — R079 Chest pain, unspecified: Secondary | ICD-10-CM | POA: Diagnosis present

## 2016-04-17 DIAGNOSIS — R195 Other fecal abnormalities: Secondary | ICD-10-CM | POA: Diagnosis present

## 2016-04-17 DIAGNOSIS — N184 Chronic kidney disease, stage 4 (severe): Secondary | ICD-10-CM | POA: Diagnosis not present

## 2016-04-17 DIAGNOSIS — R072 Precordial pain: Secondary | ICD-10-CM

## 2016-04-17 DIAGNOSIS — I5042 Chronic combined systolic (congestive) and diastolic (congestive) heart failure: Secondary | ICD-10-CM | POA: Diagnosis present

## 2016-04-17 DIAGNOSIS — I252 Old myocardial infarction: Secondary | ICD-10-CM | POA: Insufficient documentation

## 2016-04-17 DIAGNOSIS — D75839 Thrombocytosis, unspecified: Secondary | ICD-10-CM | POA: Diagnosis present

## 2016-04-17 DIAGNOSIS — N189 Chronic kidney disease, unspecified: Secondary | ICD-10-CM | POA: Diagnosis present

## 2016-04-17 DIAGNOSIS — D509 Iron deficiency anemia, unspecified: Secondary | ICD-10-CM | POA: Diagnosis present

## 2016-04-17 DIAGNOSIS — N179 Acute kidney failure, unspecified: Secondary | ICD-10-CM | POA: Diagnosis not present

## 2016-04-17 DIAGNOSIS — I10 Essential (primary) hypertension: Secondary | ICD-10-CM

## 2016-04-17 DIAGNOSIS — Z79899 Other long term (current) drug therapy: Secondary | ICD-10-CM | POA: Insufficient documentation

## 2016-04-17 DIAGNOSIS — Z6841 Body Mass Index (BMI) 40.0 and over, adult: Secondary | ICD-10-CM | POA: Diagnosis not present

## 2016-04-17 DIAGNOSIS — F1490 Cocaine use, unspecified, uncomplicated: Secondary | ICD-10-CM | POA: Diagnosis not present

## 2016-04-17 DIAGNOSIS — Z886 Allergy status to analgesic agent status: Secondary | ICD-10-CM | POA: Insufficient documentation

## 2016-04-17 DIAGNOSIS — I739 Peripheral vascular disease, unspecified: Secondary | ICD-10-CM | POA: Insufficient documentation

## 2016-04-17 DIAGNOSIS — K449 Diaphragmatic hernia without obstruction or gangrene: Secondary | ICD-10-CM | POA: Diagnosis not present

## 2016-04-17 DIAGNOSIS — Z794 Long term (current) use of insulin: Secondary | ICD-10-CM | POA: Insufficient documentation

## 2016-04-17 DIAGNOSIS — I2489 Other forms of acute ischemic heart disease: Secondary | ICD-10-CM | POA: Insufficient documentation

## 2016-04-17 DIAGNOSIS — D473 Essential (hemorrhagic) thrombocythemia: Secondary | ICD-10-CM

## 2016-04-17 DIAGNOSIS — K922 Gastrointestinal hemorrhage, unspecified: Secondary | ICD-10-CM | POA: Insufficient documentation

## 2016-04-17 DIAGNOSIS — H409 Unspecified glaucoma: Secondary | ICD-10-CM | POA: Insufficient documentation

## 2016-04-17 HISTORY — DX: Chest pain, unspecified: R07.9

## 2016-04-17 HISTORY — DX: Thrombocytosis, unspecified: D75.839

## 2016-04-17 LAB — CBC WITH DIFFERENTIAL/PLATELET
BASOS ABS: 0 10*3/uL (ref 0.0–0.1)
BASOS PCT: 0 %
EOS ABS: 0.2 10*3/uL (ref 0.0–0.7)
EOS PCT: 2 %
HCT: 25.1 % — ABNORMAL LOW (ref 36.0–46.0)
HEMOGLOBIN: 7.3 g/dL — AB (ref 12.0–15.0)
LYMPHS ABS: 2.3 10*3/uL (ref 0.7–4.0)
Lymphocytes Relative: 20 %
MCH: 22.1 pg — AB (ref 26.0–34.0)
MCHC: 29.1 g/dL — ABNORMAL LOW (ref 30.0–36.0)
MCV: 76.1 fL — ABNORMAL LOW (ref 78.0–100.0)
Monocytes Absolute: 1.3 10*3/uL — ABNORMAL HIGH (ref 0.1–1.0)
Monocytes Relative: 11 %
NEUTROS PCT: 67 %
Neutro Abs: 7.9 10*3/uL — ABNORMAL HIGH (ref 1.7–7.7)
PLATELETS: 505 10*3/uL — AB (ref 150–400)
RBC: 3.3 MIL/uL — AB (ref 3.87–5.11)
RDW: 19.2 % — ABNORMAL HIGH (ref 11.5–15.5)
WBC: 11.7 10*3/uL — AB (ref 4.0–10.5)

## 2016-04-17 LAB — BASIC METABOLIC PANEL
Anion gap: 13 (ref 5–15)
BUN: 91 mg/dL — AB (ref 6–20)
CHLORIDE: 102 mmol/L (ref 101–111)
CO2: 23 mmol/L (ref 22–32)
CREATININE: 2.69 mg/dL — AB (ref 0.44–1.00)
Calcium: 8.8 mg/dL — ABNORMAL LOW (ref 8.9–10.3)
GFR, EST AFRICAN AMERICAN: 20 mL/min — AB (ref >60)
GFR, EST NON AFRICAN AMERICAN: 17 mL/min — AB (ref >60)
Glucose, Bld: 270 mg/dL — ABNORMAL HIGH (ref 65–99)
Potassium: 4.2 mmol/L (ref 3.5–5.1)
SODIUM: 138 mmol/L (ref 135–145)

## 2016-04-17 LAB — RAPID URINE DRUG SCREEN, HOSP PERFORMED
Amphetamines: NOT DETECTED
BARBITURATES: NOT DETECTED
BENZODIAZEPINES: NOT DETECTED
COCAINE: NOT DETECTED
OPIATES: POSITIVE — AB
Tetrahydrocannabinol: NOT DETECTED

## 2016-04-17 LAB — PROTIME-INR
INR: 1.12 (ref 0.00–1.49)
PROTHROMBIN TIME: 14.6 s (ref 11.6–15.2)

## 2016-04-17 LAB — PREPARE RBC (CROSSMATCH)

## 2016-04-17 LAB — GLUCOSE, CAPILLARY: GLUCOSE-CAPILLARY: 159 mg/dL — AB (ref 65–99)

## 2016-04-17 LAB — APTT: APTT: 27 s (ref 24–37)

## 2016-04-17 LAB — POC OCCULT BLOOD, ED: Fecal Occult Bld: POSITIVE — AB

## 2016-04-17 LAB — I-STAT TROPONIN, ED: TROPONIN I, POC: 0.02 ng/mL (ref 0.00–0.08)

## 2016-04-17 MED ORDER — LATANOPROST 0.005 % OP SOLN
1.0000 [drp] | Freq: Every day | OPHTHALMIC | Status: DC
  Administered 2016-04-18 (×2): 1 [drp] via OPHTHALMIC
  Filled 2016-04-17 (×2): qty 2.5

## 2016-04-17 MED ORDER — SODIUM CHLORIDE 0.9 % IV SOLN
Freq: Once | INTRAVENOUS | Status: AC
  Administered 2016-04-18: via INTRAVENOUS

## 2016-04-17 MED ORDER — MORPHINE SULFATE (PF) 2 MG/ML IV SOLN
2.0000 mg | Freq: Once | INTRAVENOUS | Status: AC
  Administered 2016-04-17: 2 mg via INTRAVENOUS
  Filled 2016-04-17: qty 1

## 2016-04-17 MED ORDER — MOMETASONE FURO-FORMOTEROL FUM 200-5 MCG/ACT IN AERO
2.0000 | INHALATION_SPRAY | Freq: Two times a day (BID) | RESPIRATORY_TRACT | Status: DC
  Administered 2016-04-18 – 2016-04-21 (×4): 2 via RESPIRATORY_TRACT
  Filled 2016-04-17 (×3): qty 8.8

## 2016-04-17 MED ORDER — PANTOPRAZOLE SODIUM 40 MG IV SOLR
40.0000 mg | Freq: Once | INTRAVENOUS | Status: AC
  Administered 2016-04-17: 40 mg via INTRAVENOUS
  Filled 2016-04-17: qty 40

## 2016-04-17 MED ORDER — BRINZOLAMIDE 1 % OP SUSP
1.0000 [drp] | Freq: Three times a day (TID) | OPHTHALMIC | Status: DC
  Administered 2016-04-18 – 2016-04-21 (×10): 1 [drp] via OPHTHALMIC
  Filled 2016-04-17: qty 10

## 2016-04-17 MED ORDER — PRAVASTATIN SODIUM 40 MG PO TABS
40.0000 mg | ORAL_TABLET | Freq: Every day | ORAL | Status: DC
  Administered 2016-04-18 – 2016-04-20 (×4): 40 mg via ORAL
  Filled 2016-04-17 (×4): qty 1

## 2016-04-17 MED ORDER — TRAMADOL HCL 50 MG PO TABS
50.0000 mg | ORAL_TABLET | Freq: Three times a day (TID) | ORAL | Status: DC
  Administered 2016-04-18 – 2016-04-21 (×10): 50 mg via ORAL
  Filled 2016-04-17 (×12): qty 1

## 2016-04-17 MED ORDER — SODIUM CHLORIDE 0.9 % IV SOLN
INTRAVENOUS | Status: AC
  Administered 2016-04-18: 03:00:00 via INTRAVENOUS

## 2016-04-17 MED ORDER — LORATADINE 10 MG PO TABS
10.0000 mg | ORAL_TABLET | Freq: Every day | ORAL | Status: DC
Start: 2016-04-18 — End: 2016-04-21
  Administered 2016-04-18 – 2016-04-21 (×4): 10 mg via ORAL
  Filled 2016-04-17 (×4): qty 1

## 2016-04-17 MED ORDER — NITROGLYCERIN 0.4 MG SL SUBL: 0.4000 mg | SUBLINGUAL_TABLET | SUBLINGUAL | Status: DC | PRN

## 2016-04-17 MED ORDER — ONDANSETRON HCL 4 MG/2ML IJ SOLN: 4.0000 mg | Freq: Four times a day (QID) | INTRAMUSCULAR | Status: DC | PRN

## 2016-04-17 MED ORDER — NITROGLYCERIN 0.4 MG SL SUBL
0.4000 mg | SUBLINGUAL_TABLET | SUBLINGUAL | Status: DC | PRN
Start: 2016-04-17 — End: 2016-04-17
  Administered 2016-04-17: 0.4 mg via SUBLINGUAL
  Filled 2016-04-17: qty 1

## 2016-04-17 MED ORDER — NITROGLYCERIN 0.4 MG SL SUBL
0.4000 mg | SUBLINGUAL_TABLET | SUBLINGUAL | Status: DC | PRN
Start: 2016-04-17 — End: 2016-04-17

## 2016-04-17 MED ORDER — GABAPENTIN 300 MG PO CAPS
300.0000 mg | ORAL_CAPSULE | Freq: Every day | ORAL | Status: DC
  Administered 2016-04-18 – 2016-04-20 (×4): 300 mg via ORAL
  Filled 2016-04-17 (×4): qty 1

## 2016-04-17 MED ORDER — ACETAMINOPHEN 325 MG PO TABS: 650.0000 mg | ORAL_TABLET | ORAL | Status: DC | PRN

## 2016-04-17 MED ORDER — ASPIRIN 81 MG PO CHEW
324.0000 mg | CHEWABLE_TABLET | Freq: Once | ORAL | Status: DC
Start: 2016-04-17 — End: 2016-04-17

## 2016-04-17 MED ORDER — INSULIN GLARGINE 100 UNIT/ML ~~LOC~~ SOLN
20.0000 [IU] | Freq: Every day | SUBCUTANEOUS | Status: DC
  Administered 2016-04-18: 20 [IU] via SUBCUTANEOUS
  Filled 2016-04-17: qty 0.2

## 2016-04-17 MED ORDER — BUPROPION HCL ER (SR) 150 MG PO TB12
150.0000 mg | ORAL_TABLET | Freq: Two times a day (BID) | ORAL | Status: DC
  Administered 2016-04-18 – 2016-04-21 (×8): 150 mg via ORAL
  Filled 2016-04-17 (×8): qty 1

## 2016-04-17 MED ORDER — INSULIN ASPART 100 UNIT/ML ~~LOC~~ SOLN
0.0000 [IU] | Freq: Three times a day (TID) | SUBCUTANEOUS | Status: DC
  Administered 2016-04-19: 3 [IU] via SUBCUTANEOUS
  Administered 2016-04-19: 8 [IU] via SUBCUTANEOUS
  Administered 2016-04-19: 2 [IU] via SUBCUTANEOUS
  Administered 2016-04-20: 3 [IU] via SUBCUTANEOUS

## 2016-04-17 MED ORDER — PANTOPRAZOLE SODIUM 40 MG IV SOLR
40.0000 mg | Freq: Two times a day (BID) | INTRAVENOUS | Status: DC
  Administered 2016-04-18 – 2016-04-21 (×7): 40 mg via INTRAVENOUS
  Filled 2016-04-17 (×7): qty 40

## 2016-04-17 NOTE — H&P (Signed)
History and Physical    ORIS HENNECKE [id number] DOB: [birth year] DOA: 04/17/2016  Referring Provider: EDP PCP: Rogers Blocker, MD  Outpatient Specialists: Dr. Martinique (cardiology), Dr. Joelyn Oms (nephrology), Dr. Prescott Gum (CTS)   Patient coming from: Home   Chief Complaint: Chest pain, SOB  HPI: [name] [name] is a 65 y.o. female with medical history significant for CAD status post CABG, chronic kidney disease stage IV, chronic microcytic anemia, chronic combined systolic/diastolic CHF, cocaine abuse, and insulin-dependent diabetes mellitus who presents to the ED with acute onset left-sided chest pain with diaphoresis and dyspnea. Patient's symptoms began at approximately 3 PM on day of admission and EMS was activated. Patient describes the pain as left sided with radiation towards the central chest, associated with diaphoresis and dyspnea, and with no alleviating or exacerbating factors identified. Patient denies any recent fevers, chills, long distance travel, or unilateral leg swelling or tenderness. She denies melena, hematochezia, abdominal pain, or vomiting. Patient was given sublingual nitroglycerin 1, 324 mg aspirin chew, and a 250 mL bolus of normal saline en route to the hospital with EMS.   ED Course: Upon arrival to the ED, patient is found to be afebrile, saturating well on room air, and with vital signs stable. EKG features a sinus rhythm with RSR'in lead V1 and LVH by voltage criteria. Initial troponin is 0.02. Chest x-ray demonstrates minimal cardiomegaly and post CABG changes. BMP is notable for BUN of 91 and serum creatinine of 2.69, up from an apparent baseline of 2.0. CBC features a leukocytosis to 11,700, hemoglobin of 7.3, down from an apparent baseline of 9.8, and a platelet count of 505,000. DR reproduce normal-appearing stool but it was FOBT positive. Patient was given an IV push of Protonix and one sublingual nitroglycerin in the emergency department. There was no change in her  symptoms and 2 mg IV morphine were administered with incomplete relief. Patient remained hemodynamically stable in the ED and will be admitted to the hospital for ongoing evaluation and management of chest pain and significant anemia with FOBT positive stool.  Review of Systems:  All other systems reviewed and apart from HPI, are negative.  Past Medical History  Diagnosis Date  . Essential hypertension   . Diabetes mellitus     diagnosed in 2008  . Glaucoma   . Hyperlipidemia   . CVA (cerebral infarction)     a. right internal capsule stroke in 12/2006  . Left-sided sensory deficit present   . Cocaine abuse     crack cocaine heavily until 2008 then sporadic use since then  . Tobacco abuse   . Thyroid nodule     FNA in [id number] showed follicular cells but not definate neoplasm  . Coronary artery disease     a. 06/2012 NSTEMI/CABG x 3 (LIMA->LAD, VG->OM2, VG->LCX);  b. 04/2015 MV: EF<30%, mid ant, apicalanterior, apical infarct;  c. 04/2015 Cath: LM nl, LAD 90p, LCX 93m, OM1 min irregs, RCA mild dzs, LIMA->LAD nl w/ dist LAD dzs, VG->OM2 nl, VG->LCX nl-->Med Rx.  . Arthritis of left shoulder region 03/23/2013  . Chronic combined systolic and diastolic CHF (congestive heart failure) (HCC)     a. EF 40-45%, mild LVH, mid apicalanteroseptal and apical HK.  Marland Kitchen PVD (peripheral vascular disease) ([place])     a. 06/2012 ABI's: R - 0.73, L - 0.71.  Marland Kitchen CKD (chronic kidney disease), stage III     Past Surgical History  Procedure Laterality Date  . Coronary artery bypass graft  07/09/2012  Procedure: CORONARY ARTERY BYPASS GRAFTING (CABG);  Surgeon: Ivin Poot, MD;  Location: [facility];  Service: Open Heart Surgery;  Laterality: N/A;  . Cardiac catheterization    . Sternal wound debridement  08/17/2012    Procedure: STERNAL WOUND DEBRIDEMENT;  Surgeon: Ivin Poot, MD;  Location: [facility];  Service: Thoracic;  Laterality: N/A;  wound vac application  . Sternal wound debridement  08/24/2012    Procedure:  STERNAL WOUND DEBRIDEMENT;  Surgeon: Ivin Poot, MD;  Location: [facility];  Service: Thoracic;  Laterality: N/A;  . Sternal wound debridement  09/01/2012    Procedure: STERNAL WOUND DEBRIDEMENT;  Surgeon: Ivin Poot, MD;  Location: [facility];  Service: Thoracic;  Laterality: N/A;  . Sternal wound debridement  09/20/2012    Procedure: STERNAL WOUND DEBRIDEMENT;  Surgeon: Ivin Poot, MD;  [facility];  Service: Thoracic;  Laterality: N/A;  wound vac change  . Left heart catheterization with coronary angiogram N/A 06/29/2012    Procedure: LEFT HEART CATHETERIZATION WITH CORONARY ANGIOGRAM;  Surgeon: Peter M Martinique, MD;  Location: [facility] CATH LAB;  Service: Cardiovascular;  Laterality: N/A;  . Cardiac catheterization N/A 05/17/2015    Procedure: Left Heart Cath and Cors/Grafts Angiography;  Surgeon: [name], MD;  Location: [facility] CV LAB;  Service: Cardiovascular;  Laterality: N/A;     reports that she has been smoking.  She does not have any smokeless tobacco history on file. She reports that she uses illicit drugs (Cocaine). She reports that she does not drink alcohol.  Allergies  Allergen Reactions  . Naproxen Rash    Family History  Problem Relation Age of Onset  . Other      no known family CAD  . Diabetes Mother   . Hypertension Mother   . Hyperlipidemia Father      Prior to Admission medications   Medication Sig Start Date End Date Taking? Authorizing Provider  aspirin EC 81 MG EC tablet Take 1 tablet (81 mg total) by mouth daily. 01/16/16  Yes [name], DO  brinzolamide (AZOPT) 1 % ophthalmic suspension Place 1 drop into both eyes 3 (three) times daily.   Yes Historical Provider, MD  buPROPion (WELLBUTRIN SR) 150 MG 12 hr tablet Take 1 tablet (150 mg total) by mouth 2 (two) times daily. 01/16/16  Yes [name], DO  cetirizine (ZYRTEC) 10 MG tablet Take 10 mg by mouth daily as needed for allergies.    Yes Historical Provider, MD  Fluticasone-Salmeterol  (ADVAIR) 250-50 MCG/DOSE AEPB Inhale 1 puff into the lungs every 12 (twelve) hours.   Yes Historical Provider, MD  furosemide (LASIX) 40 MG tablet Take 1 tablet (40 mg total) by mouth daily. Take additional dose if you experience weight gain. 05/17/15  Yes [name], DO  furosemide (LASIX) 80 MG tablet Take 1 tablet (80 mg total) by mouth daily. 01/16/16  Yes [name], DO  gabapentin (NEURONTIN) 300 MG capsule 300 mg at bedtime. 01/31/16  Yes Historical Provider, MD  glimepiride (AMARYL) 4 MG tablet Take 4 mg by mouth daily before breakfast.   Yes Historical Provider, MD  Insulin Glargine (LANTUS SOLOSTAR) 100 UNIT/ML Solostar Pen Inject 25 Units into the skin daily at 10 pm. 01/16/16  Yes [name], DO  latanoprost (XALATAN) 0.005 % ophthalmic solution Place 1 drop into both eyes at bedtime.   Yes Historical Provider, MD  metoprolol tartrate (LOPRESSOR) 25 MG tablet Take 0.5 tablets (12.5 mg total) by mouth 2 (two) times  daily. 01/16/16  Yes [name], DO  pravastatin (PRAVACHOL) 40 MG tablet Take 40 mg by mouth at bedtime.   Yes Historical Provider, MD  traMADol (ULTRAM) 50 MG tablet Take 50 mg by mouth 3 (three) times daily.    Yes Historical Provider, MD    Physical Exam: Filed Vitals:   04/17/16 2130 04/17/16 2200 04/17/16 2215 04/17/16 2259  BP: 146/63 153/50 167/52   Pulse: 37 38 38   Temp:    98 F (36.7 C)  TempSrc:    Oral  Resp: 28 14 15    Height:      Weight:      SpO2: 100% 100% 100%       Constitutional: NAD, calm, comfortable Eyes: PERTLA, lids and conjunctivae normal ENMT: Mucous membranes are moist. Posterior pharynx clear of any exudate or lesions. Normal dentition.  Neck: normal, supple, no masses, no thyromegaly Respiratory: clear to auscultation bilaterally, no wheezing, no crackles. Normal respiratory effort. No accessory muscle use.  Cardiovascular: S1 & S2 heard, regular rate and rhythm, no murmurs / rubs / gallops. No extremity edema. 2+  pedal pulses.   Abdomen: No distension, no tenderness, no masses palpated. No hepatosplenomegaly. Bowel sounds normal.  Musculoskeletal: no clubbing / cyanosis. No joint deformity upper and lower extremities. Good ROM, no contractures. Normal muscle tone.  Skin: no rashes, lesions, ulcers. No induration Neurologic: CN 2-12 grossly intact. Sensation intact, DTR normal. Strength 5/5 in all 4 limbs.  Psychiatric: Normal judgment and insight. Alert and oriented x 3. Normal mood.     Labs on Admission: I have personally reviewed following labs and imaging studies  CBC:  Recent Labs Lab 04/17/16 1836  WBC 11.7*  NEUTROABS 7.9*  HGB 7.3*  HCT 25.1*  MCV 76.1*  PLT [id number]*   Basic Metabolic Panel:  Recent Labs Lab 04/17/16 1836  NA 138  K 4.2  CL 102  CO2 23  GLUCOSE 270*  BUN 91*  CREATININE 2.69*  CALCIUM 8.8*   GFR: Estimated Creatinine Clearance: 28.1 mL/min (by C-G formula based on Cr of 2.69). Liver Function Tests: No results for input(s): AST, ALT, ALKPHOS, BILITOT, PROT, ALBUMIN in the last 168 hours. No results for input(s): LIPASE, AMYLASE in the last 168 hours. No results for input(s): AMMONIA in the last 168 hours. Coagulation Profile: No results for input(s): INR, PROTIME in the last 168 hours. Cardiac Enzymes: No results for input(s): CKTOTAL, CKMB, CKMBINDEX, TROPONINI in the last 168 hours. BNP (last 3 results) No results for input(s): PROBNP in the last 8760 hours. HbA1C: No results for input(s): HGBA1C in the last 72 hours. CBG: No results for input(s): GLUCAP in the last 168 hours. Lipid Profile: No results for input(s): CHOL, HDL, LDLCALC, TRIG, CHOLHDL, LDLDIRECT in the last 72 hours. Thyroid Function Tests: No results for input(s): TSH, T4TOTAL, FREET4, T3FREE, THYROIDAB in the last 72 hours. Anemia Panel: No results for input(s): VITAMINB12, FOLATE, FERRITIN, TIBC, IRON, RETICCTPCT in the last 72 hours. Urine analysis:    Component Value  Date/Time   COLORURINE YELLOW 05/12/2015 0938   APPEARANCEUR CLEAR 05/12/2015 0938   LABSPEC 1.008 05/12/2015 0938   PHURINE 5.0 05/12/2015 0938   GLUCOSEU 250* 05/12/2015 0938   HGBUR NEGATIVE 05/12/2015 0938   BILIRUBINUR NEGATIVE 05/12/2015 0938   KETONESUR NEGATIVE 05/12/2015 0938   PROTEINUR NEGATIVE 05/12/2015 0938   UROBILINOGEN 0.2 05/12/2015 0938   NITRITE NEGATIVE 05/12/2015 0938   LEUKOCYTESUR MODERATE* 05/12/2015 0938   Sepsis Labs: @LABRCNTIP (procalcitonin:4,lacticidven:4) )No results found  for this or any previous visit (from the past 240 hour(s)).   Radiological Exams on Admission: Dg Chest 2 View  04/17/2016  CLINICAL DATA:  LEFT chest pain after eating today, more pain radiating to central chest, diaphoresis, diabetes mellitus, CHF, coronary artery disease, prior stroke, chronic kidney disease, hypertension EXAM: CHEST  2 VIEW COMPARISON:  01/13/2016 FINDINGS: Minimal enlargement of cardiac silhouette post CABG. Tortuous aorta. Mediastinal contours and pulmonary vascularity otherwise normal. Lungs clear. No infiltrate, pleural effusion, or pneumothorax. Bones demineralized. IMPRESSION: Minimal enlargement of cardiac silhouette post CABG. No acute abnormalities. Electronically Signed   By: Lavonia Dana M.D.   On: 04/17/2016 18:25    EKG: Independently reviewed. Sinus rhythm, RSR' in V1, LVH by voltage criteria   Assessment/Plan  1. Chest pain  - Has known CAD s/p CABG  - Initial troponin 0.02, EKG without acute ischemic features  - ASA 324 mg given by EMS en route  - Not significantly changed with NTG x2, improved with morphine  - Monitor on telemetry for ischemic change  - Obtain serial troponin measurements, repeat EKG  - Continue statin - Beta-blocker not given in light of cocaine abuse while waiting on UDS   2. Acute on chronic anemia   - Hgb 7.3 on admission, down from 9.8 in February 2017  - Pt denies melena or hematochezia but is FOBT +  - Upper GI source  suspected given marked elevation in BUN  - Continue IV Protonix 40 mg q12h  - Follow H/H  - Hold pharmacologic VTE ppx and further ASA for now  - Given CAD with active CP, 1 unit pRBCs ordered for immediate transfusion   3. AKI on CKD stage IV - SCr 2.69, up from apparent baseline of 2.20   - Likely prerenal in light of elevated BUN and anemia  - Gently hydrating with NS overnight  - Avoid nephrotoxins where possible  - Repeat chem panel in am   4. CAD s/p CABG - Underwent CABG in July 2013 with Dr. Prescott Gum  - Concern for possible ACS, evaluation and management as above  - Continue statin, holding ASA in light of anemia with suspected GI blood loss    5. Chronic combined systolic/diastolic CHF  - TTE (99991111) with EF 123456, grade 2 diastolic dysfunction, mild MR  - Appears dry on admission, hydrating gently, holding Lasix   - Follow strict I/Os, daily wts   6. Type II DM  - Managed with Lantus 25 units qHS at home  - Continue Lantus at reduced-dose with a SSI correctional, to be adjusted prn  - Check CBG with meals and qHS  - A1c was 6.0% in January 2017, reflecting excellent control (and risk for hypoglycemia)  - Update A1c, pending   7. Hypertension  - At goal currently  - Holding Lopressor in setting of suspected ACS and history of cocaine abuse  - Resume Lopressor if UDS negative for amphetamines, cocaine   8. Leukocytosis, thrombocytosis  - WBC 11,700 and plts 505,000 on admission  - No infectious process apparent at this time  - Suspect acute-phase reaction to GIB or possible ACS    DVT prophylaxis: SCD  Code Status: Full Family Communication: None present  Disposition Plan: Admit to stepdown  Consults called: None  Admission status: Inpatient    Vianne Bulls MD [facility] Pager [phone]  If 7PM-7AM, please contact night-coverage [email or web address] Password [email or web address]  04/17/2016, 11:03 PM

## 2016-04-17 NOTE — ED Notes (Signed)
Pt arrives EMS with c/o left chest pain after eating pork rinds. Pt states pain radiates to center of chest.c/o diaphoresis and Shob at home. No diaphoresis now. Nitro x 1 per EMS with bp down to 84. Given 250 cc NS and 324 ASA.

## 2016-04-17 NOTE — ED Provider Notes (Signed)
CSN: [id number]     Arrival date & time 04/17/16  1743 History   First MD Initiated Contact with Patient 04/17/16 1747     Chief Complaint  Patient presents with  . Chest Pain      HPI   [name] is an 65 y.o. female with history of CAD (s/p CABG x 30 June 2012, cath 2016 with patent grafts), CHF, HTN, HLD, DM, h/o stroke, obesity, cocaine abuse who presents to the ED for evaluation of chest pain. She states she was in her usual state of health until about one hour PTA when she had sudden onset left sided chest pain with associated difficulty breathing. She was given ASA and nitro en route by EMS with drop in BP and minimal improvement in pain. Pt states her pain is constant and rates it an 8-9/10 currently. She states the pain does not radiate. Denies diaphoresis, N/V. Denies new swelling. She denies recent drug use. She states she has been taking her medications as prescribed.  Past Medical History  Diagnosis Date  . Essential hypertension   . Diabetes mellitus     diagnosed in 2008  . Glaucoma   . Hyperlipidemia   . CVA (cerebral infarction)     a. right internal capsule stroke in 12/2006  . Left-sided sensory deficit present   . Cocaine abuse     crack cocaine heavily until 2008 then sporadic use since then  . Tobacco abuse   . Thyroid nodule     FNA in [id number] showed follicular cells but not definate neoplasm  . Coronary artery disease     a. 06/2012 NSTEMI/CABG x 3 (LIMA->LAD, VG->OM2, VG->LCX);  b. 04/2015 MV: EF<30%, mid ant, apicalanterior, apical infarct;  c. 04/2015 Cath: LM nl, LAD 90p, LCX 27m, OM1 min irregs, RCA mild dzs, LIMA->LAD nl w/ dist LAD dzs, VG->OM2 nl, VG->LCX nl-->Med Rx.  . Arthritis of left shoulder region 03/23/2013  . Chronic combined systolic and diastolic CHF (congestive heart failure) (HCC)     a. EF 40-45%, mild LVH, mid apicalanteroseptal and apical HK.  Marland Kitchen PVD (peripheral vascular disease) ([place])     a. 06/2012 ABI's: R - 0.73, L - 0.71.  Marland Kitchen CKD (chronic  kidney disease), stage III    Past Surgical History  Procedure Laterality Date  . Coronary artery bypass graft  07/09/2012    Procedure: CORONARY ARTERY BYPASS GRAFTING (CABG);  Surgeon: Ivin Poot, MD;  Location: [place];  Service: Open Heart Surgery;  Laterality: N/A;  . Cardiac catheterization    . Sternal wound debridement  08/17/2012    Procedure: STERNAL WOUND DEBRIDEMENT;  Surgeon: Ivin Poot, MD;  Location: [place];  Service: Thoracic;  Laterality: N/A;  wound vac application  . Sternal wound debridement  08/24/2012    Procedure: STERNAL WOUND DEBRIDEMENT;  Surgeon: Ivin Poot, MD;  Location: [place];  Service: Thoracic;  Laterality: N/A;  . Sternal wound debridement  09/01/2012    Procedure: STERNAL WOUND DEBRIDEMENT;  Surgeon: Ivin Poot, MD;  Location: [place];  Service: Thoracic;  Laterality: N/A;  . Sternal wound debridement  09/20/2012    Procedure: STERNAL WOUND DEBRIDEMENT;  Surgeon: Ivin Poot, MD;  [facility];  Service: Thoracic;  Laterality: N/A;  wound vac change  . Left heart catheterization with coronary angiogram N/A 06/29/2012    Procedure: LEFT HEART CATHETERIZATION WITH CORONARY ANGIOGRAM;  Surgeon: Peter M Martinique, MD;  Location: [facility] CATH LAB;  Service: Cardiovascular;  Laterality: N/A;  . Cardiac catheterization N/A 05/17/2015    Procedure: Left Heart Cath and Cors/Grafts Angiography;  Surgeon: [name], MD;  Location: [place] CV LAB;  Service: Cardiovascular;  Laterality: N/A;   Family History  Problem Relation Age of Onset  . Other      no known family CAD  . Diabetes Mother   . Hypertension Mother   . Hyperlipidemia Father    Social History  Substance Use Topics  . Smoking status: Current Some Day Smoker -- 0.25 packs/day  . Smokeless tobacco: Not on file  . Alcohol Use: No   OB History    No data available     Review of Systems  All other systems reviewed and are negative.     Allergies  Naproxen  Home Medications    Prior to Admission medications   Medication Sig Start Date End Date Taking? Authorizing Provider  aspirin EC 81 MG EC tablet Take 1 tablet (81 mg total) by mouth daily. 01/16/16   [name], DO  brinzolamide (AZOPT) 1 % ophthalmic suspension Place 1 drop into both eyes 3 (three) times daily.    Historical Provider, MD  buPROPion (WELLBUTRIN SR) 150 MG 12 hr tablet Take 1 tablet (150 mg total) by mouth 2 (two) times daily. 01/16/16   [name], DO  cetirizine (ZYRTEC) 10 MG tablet Take 10 mg by mouth daily as needed for allergies.     Historical Provider, MD  Fluticasone-Salmeterol (ADVAIR) 250-50 MCG/DOSE AEPB Inhale 1 puff into the lungs every 12 (twelve) hours.    Historical Provider, MD  furosemide (LASIX) 40 MG tablet Take 1 tablet (40 mg total) by mouth daily. Take additional dose if you experience weight gain. 05/17/15   [name], DO  furosemide (LASIX) 80 MG tablet Take 1 tablet (80 mg total) by mouth daily. 01/16/16   [name], DO  gabapentin (NEURONTIN) 300 MG capsule 300 mg at bedtime. 01/31/16   Historical Provider, MD  glimepiride (AMARYL) 4 MG tablet Take 4 mg by mouth daily before breakfast.    Historical Provider, MD  Insulin Glargine (LANTUS SOLOSTAR) 100 UNIT/ML Solostar Pen Inject 25 Units into the skin daily at 10 pm. 01/16/16   [name], DO  latanoprost (XALATAN) 0.005 % ophthalmic solution Place 1 drop into both eyes at bedtime.    Historical Provider, MD  metoprolol tartrate (LOPRESSOR) 25 MG tablet Take 0.5 tablets (12.5 mg total) by mouth 2 (two) times daily. 01/16/16   [name], DO  pravastatin (PRAVACHOL) 40 MG tablet Take 40 mg by mouth at bedtime.    Historical Provider, MD  traMADol (ULTRAM) 50 MG tablet Take 50 mg by mouth 3 (three) times daily.     Historical Provider, MD   There were no vitals taken for this visit. Physical Exam  Constitutional: She is oriented to person, place, and time.  Obese, NAD  HENT:  Right Ear: External  ear normal.  Left Ear: External ear normal.  Nose: Nose normal.  Mouth/Throat: Oropharynx is clear and moist. No oropharyngeal exudate.  Eyes: Conjunctivae and EOM are normal. Pupils are equal, round, and reactive to light.  Neck: Normal range of motion. Neck supple.  Cardiovascular: Normal rate, regular rhythm, normal heart sounds and intact distal pulses.   Pulmonary/Chest: Effort normal and breath sounds normal. No respiratory distress. She has no wheezes. She exhibits no tenderness.  Abdominal: Soft. Bowel sounds are normal. She exhibits no distension. There is no tenderness.  Musculoskeletal:  Trace  bilateral LE edema. Pt states she is at baseline.  Neurological: She is alert and oriented to person, place, and time. No cranial nerve deficit.  Skin: Skin is warm and dry.  Psychiatric: She has a normal mood and affect.  Nursing note and vitals reviewed.   ED Course  Procedures (including critical care time) Labs Review Labs Reviewed  CBC WITH DIFFERENTIAL/PLATELET - Abnormal; Notable for the following:    WBC 11.7 (*)    RBC 3.30 (*)    Hemoglobin 7.3 (*)    HCT 25.1 (*)    MCV 76.1 (*)    MCH 22.1 (*)    MCHC 29.1 (*)    RDW 19.2 (*)    Platelets 505 (*)    Neutro Abs 7.9 (*)    Monocytes Absolute 1.3 (*)    All other components within normal limits  BASIC METABOLIC PANEL  URINE RAPID DRUG SCREEN, HOSP PERFORMED  I-STAT TROPOININ, ED  POC OCCULT BLOOD, ED    Imaging Review Dg Chest 2 View  04/17/2016  CLINICAL DATA:  LEFT chest pain after eating today, more pain radiating to central chest, diaphoresis, diabetes mellitus, CHF, coronary artery disease, prior stroke, chronic kidney disease, hypertension EXAM: CHEST  2 VIEW COMPARISON:  01/13/2016 FINDINGS: Minimal enlargement of cardiac silhouette post CABG. Tortuous aorta. Mediastinal contours and pulmonary vascularity otherwise normal. Lungs clear. No infiltrate, pleural effusion, or pneumothorax. Bones demineralized.  IMPRESSION: Minimal enlargement of cardiac silhouette post CABG. No acute abnormalities. Electronically Signed   By: Lavonia Dana M.D.   On: 04/17/2016 18:25   I have personally reviewed and evaluated these images and lab results as part of my medical decision-making.   EKG Interpretation   Date/Time:  Thursday April 17 2016 17:58:23 EDT Ventricular Rate:  94 PR Interval:  150 QRS Duration: 90 QT Interval:  386 QTC Calculation: 483 R Axis:   35 Text Interpretation:  Sinus rhythm RSR' in V1 or V2, probably normal  variant LVH with secondary repolarization abnormality Confirmed by DELO   MD, DOUGLAS ([id number]) on 04/17/2016 6:45:45 PM      MDM   Final diagnoses:  Chest pain, unspecified chest pain type  Gastrointestinal hemorrhage, unspecified gastritis, unspecified gastrointestinal hemorrhage type  Anemia, unspecified anemia type    Trop negative. CXR negative. EKG unchanged from prior. Pt initially declined second nitro so gave small dose of morphine. She reports continued chest pain. Will order SL NG.   CBC does reveal anemia with hgb 7.3, hct 25.1, which is worse than prior. During her last admission pt was diagnosed with iron deficiency anemia and told to f/u as an outpatient which she states she has not done. She has not noticed any new blood in stool, vaginal bleeding, or bleeding anywhere else. She denies SOB, weakness, feeling lightheaded. Will check heme occult.   Heme occult is positive. Will give dose of protonix. Pt hemodynamically stable. i will call hospitalist for admission for chest pain rule out and GI bleed eval. HEART score 5. Pain is improved.   I spoke to Dr. Myna Hidalgo who will admit pt to stepdown. Type and screen is pending. Protonix ordered.    Anne Ng, PA-C 04/17/16 2121  Veryl Speak, MD 04/17/16 [phone]

## 2016-04-18 ENCOUNTER — Inpatient Hospital Stay (HOSPITAL_COMMUNITY): Payer: Medicare Other

## 2016-04-18 DIAGNOSIS — R079 Chest pain, unspecified: Secondary | ICD-10-CM | POA: Diagnosis not present

## 2016-04-18 DIAGNOSIS — I248 Other forms of acute ischemic heart disease: Secondary | ICD-10-CM | POA: Insufficient documentation

## 2016-04-18 DIAGNOSIS — K449 Diaphragmatic hernia without obstruction or gangrene: Secondary | ICD-10-CM | POA: Diagnosis not present

## 2016-04-18 DIAGNOSIS — N189 Chronic kidney disease, unspecified: Secondary | ICD-10-CM

## 2016-04-18 DIAGNOSIS — D649 Anemia, unspecified: Secondary | ICD-10-CM | POA: Diagnosis not present

## 2016-04-18 DIAGNOSIS — D509 Iron deficiency anemia, unspecified: Secondary | ICD-10-CM | POA: Diagnosis not present

## 2016-04-18 DIAGNOSIS — N179 Acute kidney failure, unspecified: Secondary | ICD-10-CM

## 2016-04-18 DIAGNOSIS — R072 Precordial pain: Secondary | ICD-10-CM

## 2016-04-18 DIAGNOSIS — N184 Chronic kidney disease, stage 4 (severe): Secondary | ICD-10-CM | POA: Diagnosis not present

## 2016-04-18 DIAGNOSIS — I209 Angina pectoris, unspecified: Secondary | ICD-10-CM

## 2016-04-18 DIAGNOSIS — E119 Type 2 diabetes mellitus without complications: Secondary | ICD-10-CM

## 2016-04-18 DIAGNOSIS — Z794 Long term (current) use of insulin: Secondary | ICD-10-CM

## 2016-04-18 DIAGNOSIS — I5042 Chronic combined systolic (congestive) and diastolic (congestive) heart failure: Secondary | ICD-10-CM | POA: Diagnosis not present

## 2016-04-18 DIAGNOSIS — K5520 Angiodysplasia of colon without hemorrhage: Secondary | ICD-10-CM | POA: Diagnosis not present

## 2016-04-18 DIAGNOSIS — K922 Gastrointestinal hemorrhage, unspecified: Secondary | ICD-10-CM

## 2016-04-18 DIAGNOSIS — R195 Other fecal abnormalities: Secondary | ICD-10-CM | POA: Diagnosis not present

## 2016-04-18 LAB — HEMOGLOBIN AND HEMATOCRIT, BLOOD
HCT: 26.7 % — ABNORMAL LOW (ref 36.0–46.0)
Hemoglobin: 7.8 g/dL — ABNORMAL LOW (ref 12.0–15.0)

## 2016-04-18 LAB — CBC
HEMATOCRIT: 26 % — AB (ref 36.0–46.0)
HEMATOCRIT: 27.9 % — AB (ref 36.0–46.0)
Hemoglobin: 7.6 g/dL — ABNORMAL LOW (ref 12.0–15.0)
Hemoglobin: 8.2 g/dL — ABNORMAL LOW (ref 12.0–15.0)
MCH: 22.5 pg — AB (ref 26.0–34.0)
MCH: 22.9 pg — AB (ref 26.0–34.0)
MCHC: 29.2 g/dL — ABNORMAL LOW (ref 30.0–36.0)
MCHC: 29.4 g/dL — AB (ref 30.0–36.0)
MCV: 76.9 fL — AB (ref 78.0–100.0)
MCV: 77.9 fL — AB (ref 78.0–100.0)
PLATELETS: 459 10*3/uL — AB (ref 150–400)
Platelets: 442 10*3/uL — ABNORMAL HIGH (ref 150–400)
RBC: 3.38 MIL/uL — AB (ref 3.87–5.11)
RBC: 3.58 MIL/uL — ABNORMAL LOW (ref 3.87–5.11)
RDW: 18.8 % — ABNORMAL HIGH (ref 11.5–15.5)
RDW: 18.7 % — AB (ref 11.5–15.5)
WBC: 10.5 10*3/uL (ref 4.0–10.5)
WBC: 9.5 10*3/uL (ref 4.0–10.5)

## 2016-04-18 LAB — URINALYSIS, ROUTINE W REFLEX MICROSCOPIC
Bilirubin Urine: NEGATIVE
Glucose, UA: NEGATIVE mg/dL
HGB URINE DIPSTICK: NEGATIVE
Ketones, ur: NEGATIVE mg/dL
Leukocytes, UA: NEGATIVE
NITRITE: NEGATIVE
PH: 5.5 (ref 5.0–8.0)
Protein, ur: NEGATIVE mg/dL
SPECIFIC GRAVITY, URINE: 1.014 (ref 1.005–1.030)

## 2016-04-18 LAB — HEPATIC FUNCTION PANEL
ALBUMIN: 2.8 g/dL — AB (ref 3.5–5.0)
ALT: 11 U/L — ABNORMAL LOW (ref 14–54)
AST: 15 U/L (ref 15–41)
Alkaline Phosphatase: 104 U/L (ref 38–126)
Bilirubin, Direct: <0.1 mg/dL — ABNORMAL LOW (ref 0.1–0.5)
TOTAL PROTEIN: 6.7 g/dL (ref 6.5–8.1)
Total Bilirubin: 0.4 mg/dL (ref 0.3–1.2)

## 2016-04-18 LAB — BASIC METABOLIC PANEL
Anion gap: 12 (ref 5–15)
BUN: 80 mg/dL — ABNORMAL HIGH (ref 6–20)
CHLORIDE: 107 mmol/L (ref 101–111)
CO2: 24 mmol/L (ref 22–32)
CREATININE: 2.44 mg/dL — AB (ref 0.44–1.00)
Calcium: 8.7 mg/dL — ABNORMAL LOW (ref 8.9–10.3)
GFR calc non Af Amer: 20 mL/min — ABNORMAL LOW (ref >60)
GFR, EST AFRICAN AMERICAN: 23 mL/min — AB (ref >60)
Glucose, Bld: 131 mg/dL — ABNORMAL HIGH (ref 65–99)
Potassium: 4 mmol/L (ref 3.5–5.1)
Sodium: 143 mmol/L (ref 135–145)

## 2016-04-18 LAB — TROPONIN I
TROPONIN I: 0.06 ng/mL — AB (ref <0.031)
Troponin I: 0.04 ng/mL — ABNORMAL HIGH (ref <0.031)
Troponin I: 0.07 ng/mL — ABNORMAL HIGH (ref <0.031)

## 2016-04-18 LAB — LIPID PANEL
CHOL/HDL RATIO: 4.9 ratio
Cholesterol: 138 mg/dL (ref 0–200)
HDL: 28 mg/dL — AB (ref >40)
LDL Cholesterol: 87 mg/dL (ref 0–99)
Triglycerides: 113 mg/dL (ref <150)
VLDL: 23 mg/dL (ref 0–40)

## 2016-04-18 LAB — GLUCOSE, CAPILLARY
GLUCOSE-CAPILLARY: 71 mg/dL (ref 65–99)
GLUCOSE-CAPILLARY: 127 mg/dL — AB (ref 65–99)
GLUCOSE-CAPILLARY: 104 mg/dL — AB (ref 65–99)
GLUCOSE-CAPILLARY: 265 mg/dL — AB (ref 65–99)
Glucose-Capillary: 54 mg/dL — ABNORMAL LOW (ref 65–99)
Glucose-Capillary: 185 mg/dL — ABNORMAL HIGH (ref 65–99)

## 2016-04-18 LAB — LIPASE, BLOOD: LIPASE: 31 U/L (ref 11–51)

## 2016-04-18 LAB — MRSA PCR SCREENING: MRSA BY PCR: NEGATIVE

## 2016-04-18 LAB — PREPARE RBC (CROSSMATCH)

## 2016-04-18 MED ORDER — FUROSEMIDE 10 MG/ML IJ SOLN
20.0000 mg | Freq: Once | INTRAMUSCULAR | Status: AC
Start: 2016-04-18 — End: 2016-04-18
  Administered 2016-04-18: 20 mg via INTRAVENOUS
  Filled 2016-04-18: qty 2

## 2016-04-18 MED ORDER — DEXTROSE 50 % IV SOLN
25.0000 mL | Freq: Once | INTRAVENOUS | Status: AC
Start: 2016-04-18 — End: 2016-04-18
  Administered 2016-04-18: 25 mL via INTRAVENOUS
  Filled 2016-04-18: qty 50

## 2016-04-18 MED ORDER — SODIUM CHLORIDE 0.9 % IV SOLN: Freq: Once | INTRAVENOUS | Status: DC

## 2016-04-18 MED ORDER — METOPROLOL TARTRATE 12.5 MG HALF TABLET
12.5000 mg | ORAL_TABLET | Freq: Two times a day (BID) | ORAL | Status: DC
  Administered 2016-04-18 – 2016-04-21 (×7): 12.5 mg via ORAL
  Filled 2016-04-18 (×7): qty 1

## 2016-04-18 MED ORDER — MORPHINE SULFATE (PF) 2 MG/ML IV SOLN
2.0000 mg | INTRAVENOUS | Status: DC | PRN
  Administered 2016-04-18 (×2): 2 mg via INTRAVENOUS
  Filled 2016-04-18: qty 1

## 2016-04-18 NOTE — Progress Notes (Signed)
Nutrition Brief Note  Patient identified on the Malnutrition Screening Tool (MST) Report  Wt Readings from Last 15 Encounters:  04/18/16 276 lb 11.2 oz (125.51 kg)  02/05/16 267 lb (121.11 kg)  01/16/16 257 lb 11.5 oz (116.9 kg)  06/14/15 261 lb (118.389 kg)  05/17/15 248 lb 6.4 oz (112.674 kg)  01/20/15 261 lb (118.389 kg)  01/18/15 268 lb (121.564 kg)  10/18/14 263 lb 11.2 oz (119.614 kg)  09/21/13 280 lb (127.007 kg)  03/23/13 271 lb 12.8 oz (123.288 kg)  01/19/13 248 lb (112.492 kg)  12/01/12 248 lb (112.492 kg)  11/08/12 248 lb (112.492 kg)  10/14/12 248 lb (112.492 kg)  09/21/12 256 lb 1.6 oz (116.166 kg)    Body mass index is 42.08 kg/(m^2). Patient meets criteria for Morbid Obesity based on current BMI. Pt states that for the past 3 weeks she has been stressed, causing a decreased appetite. She reports skipping occasional meals, but replacing meal with snack such as yogurt. No evidence of weight loss. Pt appears well-nourished. RD emphasized the importance of eating a healthful diet and limiting sodium intake. Pt admits to snacking on pork rinds the past few weeks. RD provided "Sodium Content of Foods List" from the Academy of [employer] and Dietetics (AND). Emphasized the importance of avoiding high sodium foods and limiting moderate sodium foods. Encouraged to eat foods on the low sodium, very low sodium, and sodium free lists of foods. Also provided "20 Ways to Enjoy More Fruits and Vegetables" and "Healthy Snacking for Adults and Teens" from the AND. Pt states that she doesn't use added salt and shops for foods with less than 100 ml of sodium per serving. She denies any additional nutrition education needs at this time.   Current diet order is Heart Healthy. Pt was NPO for breakfast and lunch. States that she is hungry and ready to eat. She requested grapes on every meal tray and yogurt with every breakfast.  Labs and medications reviewed.   No further nutrition interventions  warranted at this time. If nutrition issues arise, please consult RD.   Scarlette Ar RD, LDN Inpatient Clinical Dietitian Pager: [phone] After Hours Pager: [phone]

## 2016-04-18 NOTE — Progress Notes (Signed)
Notified K Schorr NP that patient's follow-up hemoglobin was 7.8, drawn two hours after 1 unit of RBCs finished transfusing. No new orders received at this time; will continue to monitor patient. Patient is currently resting comfortably.

## 2016-04-18 NOTE — Consult Note (Signed)
CARDIOLOGY CONSULT NOTE   Patient ID: [name] [name] MRN: [id number] DOB/AGE: [birth year] 65 y.o.  Admit date: 04/17/2016  Primary Physician   Rogers Blocker, MD Primary Cardiologist   Dr. Martinique Reason for Consultation   Chest pain and cardiac clearance Requesting Physician  Dr. [place] Pink  HPI: [name] [name] is a 65 y.o. female with a history of CAD, s/p CABG x 3 July 0000000 complicated by sternal wound infection requiring debridement in Aug 2013, DM, CKD stage IV, morbid obesity, a h/o CVA 2008 with chronic LLE weakness, HL, HTN, thyroid nodule, chronic combined systolic and diastolic CHF, prior hx of cocaine abuse and ongoing tobacco abuse who presented to ED 04/17/16 for evaluation of chest pain.   he pt had a cath in May 2016 that showed patent grafts with EF of 40-45%. She was admitted in Jan/2017 with acute on chronic systolic CHF. She admitted to using cocaine prior to this admission. She also had been eating pork rinds. Her Troponin went to 2.5, Dr Martinique felt this was demand ischemia. She had an echo that did show a drop in her EF to 35-40%. She had stopped used cocaine since then. Seen by APP 02/05/16 in clinic and was doing well.   She again a port rinds yesterday and later developed L sided upper anterior chest pain. She describes the pain as "throbbing". Denies associated SOB, nausea, diaphoresis. The pain radiated towards left abdomen. This pain is different from prior cardiac pain. The pain persisted and came to ED for further evaluation where her pain eased with SL nitro x 1. Did not improved on IV morphine x 2. Initially her pain was 9/10 currently 4/10 as dull achy. Her lab work is notable for hgb of 7.3 (baseline 9.8) with elevated BUN --> given 1 unit of PRBC --> Hgb still 7.8. Positive FOBT. WBC of 117000 and platelet of 505000. Scr of 2.69 (baseline of ~2). EKG on admission showed sinus rhythm at rate of 94 bpm, RVR' in lead V1 and V2, possible LVH, TWI in lead I, AVL, V5 and V6.  Compared to prior EKG  02/05/16 V5 and V6 TWI are new, however prior EKG did show inversion before. EKG this morning again showed resolution of TWI in V5 with PACs.  Troponin of 0.04--> 0.07. UDS positive for opiates. CXR showed no infiltrate or effusion.   The patient admits to eating salted food intermittently. She has cut back on cigarette smoking, currently smoking 3 cigarettes a day, prior one pack a day. The patient denies nausea, vomiting, fever, palpitations, shortness of breath, orthopnea, PND, dizziness, syncope, cough, congestion, abdominal pain, hematochezia, melena, lower extremity edema.   Past Medical History  Diagnosis Date  . Essential hypertension   . Diabetes mellitus     diagnosed in 2008  . Glaucoma   . Hyperlipidemia   . CVA (cerebral infarction)     a. right internal capsule stroke in 12/2006  . Left-sided sensory deficit present   . Cocaine abuse     crack cocaine heavily until 2008 then sporadic use since then  . Tobacco abuse   . Thyroid nodule     FNA in [id number] showed follicular cells but not definate neoplasm  . Coronary artery disease     a. 06/2012 NSTEMI/CABG x 3 (LIMA->LAD, VG->OM2, VG->LCX);  b. 04/2015 MV: EF<30%, mid ant, apicalanterior, apical infarct;  c. 04/2015 Cath: LM nl, LAD 90p, LCX 19m, OM1 min irregs, RCA mild dzs, LIMA->LAD nl w/ dist LAD  dzs, VG->OM2 nl, VG->LCX nl-->Med Rx.  . Arthritis of left shoulder region 03/23/2013  . Chronic combined systolic and diastolic CHF (congestive heart failure) (HCC)     a. EF 40-45%, mild LVH, mid apicalanteroseptal and apical HK.  Marland Kitchen PVD (peripheral vascular disease) ([place])     a. 06/2012 ABI's: R - 0.73, L - 0.71.  Marland Kitchen CKD (chronic kidney disease), stage III      Past Surgical History  Procedure Laterality Date  . Coronary artery bypass graft  07/09/2012    Procedure: CORONARY ARTERY BYPASS GRAFTING (CABG);  Surgeon: Ivin Poot, MD;  Location: [facility];  Service: Open Heart Surgery;  Laterality: N/A;  . Cardiac  catheterization    . Sternal wound debridement  08/17/2012    Procedure: STERNAL WOUND DEBRIDEMENT;  Surgeon: Ivin Poot, MD;  Location: [facility];  Service: Thoracic;  Laterality: N/A;  wound vac application  . Sternal wound debridement  08/24/2012    Procedure: STERNAL WOUND DEBRIDEMENT;  Surgeon: Ivin Poot, MD;  Location: [facility];  Service: Thoracic;  Laterality: N/A;  . Sternal wound debridement  09/01/2012    Procedure: STERNAL WOUND DEBRIDEMENT;  Surgeon: Ivin Poot, MD;  Location: [facility];  Service: Thoracic;  Laterality: N/A;  . Sternal wound debridement  09/20/2012    Procedure: STERNAL WOUND DEBRIDEMENT;  Surgeon: Ivin Poot, MD;  Location: [facility];  Service: Thoracic;  Laterality: N/A;  wound vac change  . Left heart catheterization with coronary angiogram N/A 06/29/2012    Procedure: LEFT HEART CATHETERIZATION WITH CORONARY ANGIOGRAM;  Surgeon: Peter M Martinique, MD;  Location: [facility] CATH LAB;  Service: Cardiovascular;  Laterality: N/A;  . Cardiac catheterization N/A 05/17/2015    Procedure: Left Heart Cath and Cors/Grafts Angiography;  Surgeon: [name], MD;  Location: [facility] CV LAB;  Service: Cardiovascular;  Laterality: N/A;    Allergies  Allergen Reactions  . Naproxen Rash    I have reviewed the patient's current medications . brinzolamide  1 drop Both Eyes TID  . buPROPion  150 mg Oral BID  . gabapentin  300 mg Oral QHS  . insulin aspart  0-15 Units Subcutaneous TID WC  . latanoprost  1 drop Both Eyes QHS  . loratadine  10 mg Oral Daily  . mometasone-formoterol  2 puff Inhalation BID  . pantoprazole (PROTONIX) IV  40 mg Intravenous Q12H  . pravastatin  40 mg Oral QHS  . traMADol  50 mg Oral TID   . sodium chloride 75 mL/hr at 04/18/16 0300   acetaminophen, morphine injection, nitroGLYCERIN, ondansetron (ZOFRAN) IV  Prior to Admission medications   Medication Sig Start Date End Date Taking? Authorizing Provider  aspirin EC 81 MG EC tablet Take 1 tablet  (81 mg total) by mouth daily. 01/16/16  Yes [name], DO  brinzolamide (AZOPT) 1 % ophthalmic suspension Place 1 drop into both eyes 3 (three) times daily.   Yes Historical Provider, MD  buPROPion (WELLBUTRIN SR) 150 MG 12 hr tablet Take 1 tablet (150 mg total) by mouth 2 (two) times daily. 01/16/16  Yes [name], DO  cetirizine (ZYRTEC) 10 MG tablet Take 10 mg by mouth daily as needed for allergies.    Yes Historical Provider, MD  Fluticasone-Salmeterol (ADVAIR) 250-50 MCG/DOSE AEPB Inhale 1 puff into the lungs every 12 (twelve) hours.   Yes Historical Provider, MD  furosemide (LASIX) 40 MG tablet Take 1 tablet (40 mg total) by mouth daily. Take additional dose if you experience  weight gain. 05/17/15  Yes [name], DO  furosemide (LASIX) 80 MG tablet Take 1 tablet (80 mg total) by mouth daily. 01/16/16  Yes [name], DO  gabapentin (NEURONTIN) 300 MG capsule 300 mg at bedtime. 01/31/16  Yes Historical Provider, MD  glimepiride (AMARYL) 4 MG tablet Take 4 mg by mouth daily before breakfast.   Yes Historical Provider, MD  Insulin Glargine (LANTUS SOLOSTAR) 100 UNIT/ML Solostar Pen Inject 25 Units into the skin daily at 10 pm. 01/16/16  Yes [name], DO  latanoprost (XALATAN) 0.005 % ophthalmic solution Place 1 drop into both eyes at bedtime.   Yes Historical Provider, MD  metoprolol tartrate (LOPRESSOR) 25 MG tablet Take 0.5 tablets (12.5 mg total) by mouth 2 (two) times daily. 01/16/16  Yes [name], DO  pravastatin (PRAVACHOL) 40 MG tablet Take 40 mg by mouth at bedtime.   Yes Historical Provider, MD  traMADol (ULTRAM) 50 MG tablet Take 50 mg by mouth 3 (three) times daily.    Yes Historical Provider, MD     Social History   Social History  . Marital Status: Single    Spouse Name: N/A  . Number of Children: N/A  . Years of Education: N/A   Occupational History  . Not on file.   Social History Main Topics  . Smoking status: Current Some Day Smoker -- 0.25  packs/day  . Smokeless tobacco: Not on file  . Alcohol Use: No  . Drug Use: Yes    Special: Cocaine  . Sexual Activity: No   Other Topics Concern  . Not on file   Social History Narrative   Lives with boyfriend of 20 years.     Family Status  Relation Status Death Age  . Mother Deceased   . Father Deceased    Family History  Problem Relation Age of Onset  . Other      no known family CAD  . Diabetes Mother   . Hypertension Mother   . Hyperlipidemia Father      ROS:  Full 14 point review of systems complete and found to be negative unless listed above.  Physical Exam: Blood pressure 148/75, pulse 80, temperature 98.1 F (36.7 C), temperature source Oral, resp. rate 16, height 5\' 8"  (1.727 m), weight 276 lb 11.2 oz (125.51 kg), SpO2 98 %.  General: Well developed, well nourished, obsese female in no acute distress Head: Eyes PERRLA, No xanthomas. Normocephalic and atraumatic, oropharynx without edema or exudate.  Lungs: Resp regular and unlabored. Diminished breath sound bibasilar with diffuse wheezing.  Heart: RRR no s3, s4, or murmurs..   Neck: No carotid bruits. No lymphadenopathy. No  JVD. Abdomen: Bowel sounds present, TTP at epigastric area and LUQ.  Msk:  No spine or cva tenderness. No weakness, no joint deformities or effusions. Extremities: No clubbing, cyanosis. Trace BL LE edema. DP/PT/Radials 2+ and equal bilaterally. Neuro: Alert and oriented X 3. No focal deficits noted. Psych:  Good affect, responds appropriately Skin: No rashes or lesions noted.  Labs:   Lab Results  Component Value Date   WBC 11.7* 04/17/2016   HGB 7.8* 04/18/2016   HCT 26.7* 04/18/2016   MCV 76.1* 04/17/2016   PLT 505* 04/17/2016    Recent Labs  04/17/16 2327  INR 1.12    Recent Labs Lab 04/17/16 1836  NA 138  K 4.2  CL 102  CO2 23  BUN 91*  CREATININE 2.69*  CALCIUM 8.8*  GLUCOSE 270*   MAGNESIUM  Date  Value Ref Range Status  01/14/2016 2.0 1.7 - 2.4 mg/dL  Final    Recent Labs  04/17/16 2327 04/18/16 0512  TROPONINI 0.04* 0.07*    Recent Labs  04/17/16 1840  TROPIPOC 0.02   PRO B NATRIURETIC PEPTIDE (BNP)  Date/Time Value Ref Range Status  10/18/2014 04:37 PM 2288.0* 0 - 125 pg/mL Final  06/29/2012 01:05 PM 12777.0* 0 - 125 pg/mL Final   Lab Results  Component Value Date   CHOL 138 04/18/2016   HDL 28* 04/18/2016   LDLCALC 87 04/18/2016   TRIG 113 04/18/2016   No results found for: DDIMER No results found for: LIPASE, AMYLASE TSH  Date/Time Value Ref Range Status  05/12/2015 06:01 AM 3.659 0.350 - 4.500 uIU/mL Final   VITAMIN B-12  Date/Time Value Ref Range Status  01/15/2016 06:03 AM 305 180 - 914 pg/mL Final    Comment:    (NOTE) This assay is not validated for testing neonatal or myeloproliferative syndrome specimens for Vitamin B12 levels.    FOLATE  Date/Time Value Ref Range Status  01/15/2016 06:03 AM 10.3 >5.9 ng/mL Final   FERRITIN  Date/Time Value Ref Range Status  01/15/2016 06:03 AM 26 11 - 307 ng/mL Final   TIBC  Date/Time Value Ref Range Status  01/15/2016 06:03 AM 342 250 - 450 ug/dL Final   IRON  Date/Time Value Ref Range Status  01/15/2016 06:03 AM 18* 28 - 170 ug/dL Final   RETIC CT PCT  Date/Time Value Ref Range Status  01/15/2016 06:03 AM 2.0 0.4 - 3.1 % Final    Echo: 01/11/16 LV EF: 35% - 40%  ------------------------------------------------------------------- Indications: CHF - 428.0.  ------------------------------------------------------------------- History: PMH: Coronary artery disease. Stroke. Risk factors: Current tobacco use. Hypertension. Dyslipidemia.  ------------------------------------------------------------------- Study Conclusions  - Left ventricle: The cavity size was normal. Wall thickness was  increased in a pattern of moderate LVH. Systolic function was  moderately reduced. The estimated ejection fraction was in the  range of 35%  to 40%. No mural thrombus identified with contrast.  Diffuse hypokinesis. Doppler parameters are consistent with  pseudonormal left ventricular relaxation (grade 2 diastolic  dysfunction). The E/A ratio is >2. The E/e&' ratio is >15,  suggesting elevated LV filling pressure. - Mitral valve: Mildly thickened leaflets . There was mild  regurgitation. - Left atrium: Moderately dilated at 43 ml/m2. - Right ventricle: The cavity size was normal. Wall thickness was  normal. Systolic function is reduced. - Inferior vena cava: The vessel was dilated. The respirophasic  diameter changes were blunted (< 50%), consistent with elevated  central venous pressure.  Impressions:  - Compared with a prior study in 04/2015, the EF is slightly worse  at 35-40%. Filling pressures are elevated.  Left Heart Cath and Cors/Grafts Angiography   05/17/2015    Conclusion    CONCLUSIONS:  SEVERE 2 VESSEL CAD OF THE LAD AND LCX  MILD RCA STENOSIS (SMALL VESSEL)  S/P CABG WITH CONTINUED PATENCY OF THE LIMA-LAD, SVG-OM, AND SVG-LEFT CIRCUMFLEX  DIFFUSE DISTAL VESSEL CAD INVOLVING THE LCX BRANCHES AND LAD/DIAGONAL BEYOND THE LIMA INSERTION  RECOMMENDATIONS:  MEDICAL THERAPY     ECG: Vent. rate 78 BPM PR interval 146 ms QRS duration 92 ms QT/QTc 434/494 ms P-R-T axes 71 7 113  Radiology:  Dg Chest 2 View  04/17/2016  CLINICAL DATA:  LEFT chest pain after eating today, more pain radiating to central chest, diaphoresis, diabetes mellitus, CHF, coronary artery disease, prior stroke, chronic kidney disease, hypertension EXAM: CHEST  2 VIEW COMPARISON:  01/13/2016 FINDINGS: Minimal enlargement of cardiac silhouette post CABG. Tortuous aorta. Mediastinal contours and pulmonary vascularity otherwise normal. Lungs clear. No infiltrate, pleural effusion, or pneumothorax. Bones demineralized. IMPRESSION: Minimal enlargement of cardiac silhouette post CABG. No acute abnormalities. Electronically Signed    By: Lavonia Dana M.D.   On: 04/17/2016 18:25   US Renal  04/18/2016  CLINICAL DATA:  Acute renal failure. EXAM: RENAL / URINARY TRACT ULTRASOUND COMPLETE COMPARISON:  None. FINDINGS: Right Kidney: Length: 11.6 cm. Renal cortical echogenicity is within normal limits. Sonographer measures a 2.0 x 1.6 x 2.2 cm hypoechoic lesion in the interpolar right kidney without substantial enhanced through transmission. No hydronephrosis right kidney. Left Kidney: Length: 11.3 cm. Echogenicity within normal limits. No mass or hydronephrosis visualized. Bladder: Appears normal for degree of bladder distention. IMPRESSION: 1. No evidence for hydronephrosis. 2. 2.2 cm hypoechoic area in the interpolar region of the right kidney may represent a prominent column of Bertin, but renal neoplasm not completely excluded. MRI of the abdomen without and with contrast recommended to further evaluate. These results will be called to the ordering clinician or representative by the Radiologist Assistant, and communication documented in the PACS or zVision Dashboard. Electronically Signed   By: Misty Stanley M.D.   On: 04/18/2016 08:35    ASSESSMENT AND PLAN:     1. Chest pain  - Her chest pain is atypical and reproducible with palpation at L anterior chest. He is also TTP at epigastric and LUQ.  Somewhat eased with SL nitro x 1. EKG with non specific lateral leads.  - Recent cath showed patient graft in 04/2015. Echo 12/2015 showed EF of 35-40%, moderate LVH. Will get repeat echo.  - She is anemic and requires GI evaluation. She will be at low to moderate risk for any procedure.   2. Elevated troponin - trend of 0.04-->0.07. Likely demand in setting of acute anemia and AKI. Continue to follow.   3.  Chronic combined systolic and diastolic CHF (congestive heart failure) ([place]) - CXr clear. Lung has diminished breath sound however no rales. LE had trace edema, likely due to IV fluids given yesterday.   4. Abdominal pain/+FOBT/Acute  anemia - Transfused 1 PRBC however Hgb still 7.8. Pending GI evaluation  5. Acute on Chronic kidney disease, stage 4 - with elevated BUN. Renal ultrasound without hydronephrosis however showed 2.2 cm hypoechoic area in the interpolar region of the right kidney may represent a prominent column of Bertin, but renal neoplasm not completely excluded. Further evaluation per primary.      Essential hypertension   Type 2 diabetes mellitus with renal manifestations, controlled (HCC)   Chronic combined systolic and diastolic CHF (congestive heart failure) (HCC)   CKD (chronic kidney disease) stage 4, GFR 15-29 ml/min (HCC)   Acute on chronic renal failure (HCC)   Microcytic anemia   Thrombocytosis (HCC)   Absolute anemia   Bleeding gastrointestinal   Insulin dependent diabetes mellitus ([place])   SignedLeanor Kail, PA 04/18/2016, 9:56 AM Pager [phone]  Co-Sign MD

## 2016-04-18 NOTE — Progress Notes (Signed)
Hypoglycemic Event  CBG: 54  Treatment: 15 GM carbohydrate snack  Symptoms: None  Follow-up CBG: Time:0800 CBG Result:71  Possible Reasons for Event: Inadequate meal intake  Comments/MD notified:Krishnan, G One amp of D5 was ordered & cbg came up to at 185 around 0900. Will continue to monitor the pt. Hoover Brunette, RN    Hoover Brunette

## 2016-04-18 NOTE — Consult Note (Signed)
Consultation  Referring Provider: [facility] Primary Care Physician:  Rogers Blocker, MD Primary Gastroenterologist:  none  Reason for Consultation:  microcytic  anemia/heme + stool  HPI: [name] [name] is a 65 y.o. female admitted last evening after onset of chest pain and diaphoresis at home about 3 PM yesterday. Patient has history of coronary artery disease a status post CABG in 2013 has history of chronic kidney disease stage IV, congestive heart failure with last EF documented at 35-40%, hx CVA, prior history of cocaine abuse which is not active currently, diabetes mellitus and morbid obesity. At time of admission she was found to be significantly anemic with a hemoglobin of 7.3 hematocrit of 35.1 and MCV of 76. Also noted Hemoccult positive. Initial troponin was within normal range but subsequent values are elevated at 0.07 and 0.06. She has been evaluated by cardiology and echo is pending this afternoon . Review of records shows that she has been anemic, hemoglobin in January was 7.8 and hemoglobin February 9 0.8. Is also previously documented iron deficient in January 2017 with serum iron of 18 TIBC 02/26/1941 and iron sat 5 Since admission hemoglobin stable she was transfused 1 unit and hemoglobin is stable at 7.6-7.8 range.   She is on aspirin at home no other blood thinners She has not had any prior GI evaluation. Patient says she has been feeling in her usual state of health until acute onset yesterday afternoon. She denies shortness of breath to me. She says she just started having chest pain on the left side radiating up into her left shoulder and then broke out into a cold sweat.She did not have any relief with nitroglycerin.9 She says she still hurting in the left side of her chest today and says this feels like "heart pain" her.  Past Medical History  Diagnosis Date  . Essential hypertension   . Diabetes mellitus     diagnosed in 2008  . Glaucoma   . Hyperlipidemia    . CVA (cerebral infarction)     a. right internal capsule stroke in 12/2006  . Left-sided sensory deficit present   . Cocaine abuse     crack cocaine heavily until 2008 then sporadic use since then  . Tobacco abuse   . Thyroid nodule     FNA in [id number] showed follicular cells but not definate neoplasm  . Coronary artery disease     a. 06/2012 NSTEMI/CABG x 3 (LIMA->LAD, VG->OM2, VG->LCX);  b. 04/2015 MV: EF<30%, mid ant, apicalanterior, apical infarct;  c. 04/2015 Cath: LM nl, LAD 90p, LCX 80m, OM1 min irregs, RCA mild dzs, LIMA->LAD nl w/ dist LAD dzs, VG->OM2 nl, VG->LCX nl-->Med Rx.  . Arthritis of left shoulder region 03/23/2013  . Chronic combined systolic and diastolic CHF (congestive heart failure) (HCC)     a. EF 40-45%, mild LVH, mid apicalanteroseptal and apical HK.  Marland Kitchen PVD (peripheral vascular disease) ([place])     a. 06/2012 ABI's: R - 0.73, L - 0.71.  Marland Kitchen CKD (chronic kidney disease), stage III     Past Surgical History  Procedure Laterality Date  . Coronary artery bypass graft  07/09/2012    Procedure: CORONARY ARTERY BYPASS GRAFTING (CABG);  Surgeon: Ivin Poot, MD;  Location: [place];  Service: Open Heart Surgery;  Laterality: N/A;  . Cardiac catheterization    . Sternal wound debridement  08/17/2012    Procedure: STERNAL WOUND DEBRIDEMENT;  Surgeon: Ivin Poot, MD;  Location: [place];  Service: Thoracic;  Laterality: N/A;  wound vac application  . Sternal wound debridement  08/24/2012    Procedure: STERNAL WOUND DEBRIDEMENT;  Surgeon: Ivin Poot, MD;  Location: [place];  Service: Thoracic;  Laterality: N/A;  . Sternal wound debridement  09/01/2012    Procedure: STERNAL WOUND DEBRIDEMENT;  Surgeon: Ivin Poot, MD;  Location: [place];  Service: Thoracic;  Laterality: N/A;  . Sternal wound debridement  09/20/2012    Procedure: STERNAL WOUND DEBRIDEMENT;  Surgeon: Ivin Poot, MD;  Location: [facility] OR;  Service: Thoracic;  Laterality: N/A;  wound vac change  . Left heart  catheterization with coronary angiogram N/A 06/29/2012    Procedure: LEFT HEART CATHETERIZATION WITH CORONARY ANGIOGRAM;  Surgeon: Peter M Martinique, MD;  Location: [facility] CATH LAB;  Service: Cardiovascular;  Laterality: N/A;  . Cardiac catheterization N/A 05/17/2015    Procedure: Left Heart Cath and Cors/Grafts Angiography;  Surgeon: [name], MD;  Location: [place] CV LAB;  Service: Cardiovascular;  Laterality: N/A;    Prior to Admission medications   Medication Sig Start Date End Date Taking? Authorizing Provider  aspirin EC 81 MG EC tablet Take 1 tablet (81 mg total) by mouth daily. 01/16/16  Yes [name], DO  brinzolamide (AZOPT) 1 % ophthalmic suspension Place 1 drop into both eyes 3 (three) times daily.   Yes Historical Provider, MD  buPROPion (WELLBUTRIN SR) 150 MG 12 hr tablet Take 1 tablet (150 mg total) by mouth 2 (two) times daily. 01/16/16  Yes [name], DO  cetirizine (ZYRTEC) 10 MG tablet Take 10 mg by mouth daily as needed for allergies.    Yes Historical Provider, MD  Fluticasone-Salmeterol (ADVAIR) 250-50 MCG/DOSE AEPB Inhale 1 puff into the lungs every 12 (twelve) hours.   Yes Historical Provider, MD  furosemide (LASIX) 40 MG tablet Take 1 tablet (40 mg total) by mouth daily. Take additional dose if you experience weight gain. 05/17/15  Yes [name], DO  furosemide (LASIX) 80 MG tablet Take 1 tablet (80 mg total) by mouth daily. 01/16/16  Yes [name], DO  gabapentin (NEURONTIN) 300 MG capsule 300 mg at bedtime. 01/31/16  Yes Historical Provider, MD  glimepiride (AMARYL) 4 MG tablet Take 4 mg by mouth daily before breakfast.   Yes Historical Provider, MD  Insulin Glargine (LANTUS SOLOSTAR) 100 UNIT/ML Solostar Pen Inject 25 Units into the skin daily at 10 pm. 01/16/16  Yes [name], DO  latanoprost (XALATAN) 0.005 % ophthalmic solution Place 1 drop into both eyes at bedtime.   Yes Historical Provider, MD  metoprolol tartrate (LOPRESSOR) 25 MG tablet  Take 0.5 tablets (12.5 mg total) by mouth 2 (two) times daily. 01/16/16  Yes [name], DO  pravastatin (PRAVACHOL) 40 MG tablet Take 40 mg by mouth at bedtime.   Yes Historical Provider, MD  traMADol (ULTRAM) 50 MG tablet Take 50 mg by mouth 3 (three) times daily.    Yes Historical Provider, MD    Current Facility-Administered Medications  Medication Dose Route Frequency Provider Last Rate Last Dose  . acetaminophen (TYLENOL) tablet 650 mg  650 mg Oral Q4H PRN Ilene Qua Opyd, MD      . brinzolamide (AZOPT) 1 % ophthalmic suspension 1 drop  1 drop Both Eyes TID Vianne Bulls, MD   1 drop at 04/18/16 0936  . buPROPion (WELLBUTRIN SR) 12 hr tablet 150 mg  150 mg Oral BID Vianne Bulls, MD   150 mg at 04/18/16 0935  .  gabapentin (NEURONTIN) capsule 300 mg  300 mg Oral QHS Ilene Qua Opyd, MD   300 mg at 04/18/16 0002  . insulin aspart (novoLOG) injection 0-15 Units  0-15 Units Subcutaneous TID WC Vianne Bulls, MD   0 Units at 04/18/16 0809  . latanoprost (XALATAN) 0.005 % ophthalmic solution 1 drop  1 drop Both Eyes QHS Vianne Bulls, MD   1 drop at 04/18/16 0002  . loratadine (CLARITIN) tablet 10 mg  10 mg Oral Daily Vianne Bulls, MD   10 mg at 04/18/16 0935  . metoprolol tartrate (LOPRESSOR) tablet 12.5 mg  12.5 mg Oral BID Skeet Latch, MD   12.5 mg at 04/18/16 1358  . mometasone-formoterol (DULERA) 200-5 MCG/ACT inhaler 2 puff  2 puff Inhalation BID Vianne Bulls, MD   2 puff at 04/18/16 0747  . morphine 2 MG/ML injection 2 mg  2 mg Intravenous Q3H PRN Bonnielee Haff, MD   2 mg at 04/18/16 0937  . nitroGLYCERIN (NITROSTAT) SL tablet 0.4 mg  0.4 mg Sublingual Q5 Min x 3 PRN Vianne Bulls, MD      . ondansetron (ZOFRAN) injection 4 mg  4 mg Intravenous Q6H PRN Vianne Bulls, MD      . pantoprazole (PROTONIX) injection 40 mg  40 mg Intravenous Q12H Vianne Bulls, MD   40 mg at 04/18/16 0935  . pravastatin (PRAVACHOL) tablet 40 mg  40 mg Oral QHS Vianne Bulls, MD   40 mg at  04/18/16 0002  . traMADol (ULTRAM) tablet 50 mg  50 mg Oral TID Vianne Bulls, MD   50 mg at 04/18/16 0935    Allergies as of 04/17/2016 - Review Complete 04/17/2016  Allergen Reaction Noted  . Naproxen Rash 09/21/2013    Family History  Problem Relation Age of Onset  . Other      no known family CAD  . Diabetes Mother   . Hypertension Mother   . Hyperlipidemia Father     Social History   Social History  . Marital Status: Single    Spouse Name: N/A  . Number of Children: N/A  . Years of Education: N/A   Occupational History  . Not on file.   Social History Main Topics  . Smoking status: Current Some Day Smoker -- 0.25 packs/day  . Smokeless tobacco: Not on file  . Alcohol Use: No  . Drug Use: Yes    Special: Cocaine  . Sexual Activity: No   Other Topics Concern  . Not on file   Social History Narrative   Lives with boyfriend of 20 years.     Review of Systems: Pertinent positive and negative review of systems were noted in the above HPI section.  All other review of systems was otherwise negative.  Physical Exam: Vital signs in last 24 hours: Temp:  [98 F (36.7 C)-98.2 F (36.8 C)] 98.1 F (36.7 C) (04/21 0250) Pulse Rate:  [37-103] 81 (04/21 1358) Resp:  [10-28] 10 (04/21 1125) BP: (116-167)/(39-95) 148/68 mmHg (04/21 1358) SpO2:  [93 %-100 %] 97 % (04/21 1125) Weight:  [259 lb (117.482 kg)-276 lb 11.2 oz (125.51 kg)] 276 lb 11.2 oz (125.51 kg) (04/21 0300) Last BM Date: 04/17/16 (per pt) General:   Alert,  Well-developed, older AA female , pleasant and cooperative in NAD Head:  Normocephalic and atraumatic. Eyes:  Sclera clear, no icterus.   Conjunctiva pale Ears:  Normal auditory acuity. Nose:  No deformity, discharge,  or lesions.  Mouth:  No deformity or lesions.   Neck:  Supple; no masses or thyromegaly. Lungs:  Clear throughout to auscultation.   No wheezes, crackles, or rhonchi. Heart:  Regular rate and rhythm; no murmurs, clicks, rubs,  or  gallops., sternal; scar, some tenderness to palpation of left chest wall Abdomen:  Soft,nontender, BS active,nonpalp mass or hsm.   Msk:  Symmetrical without gross deformities. . Pulses:  Normal pulses noted. Extremities:  Without clubbing or edema. Neurologic:  Alert and  oriented x4;  grossly normal neurologically. Skin:  Intact without significant lesions or rashes.. Psych:  Alert and cooperative. Normal mood and affect.   Lab Results:  Recent Labs  04/17/16 1836 04/18/16 0512 04/18/16 1124  WBC 11.7*  --  10.5  HGB 7.3* 7.8* 7.6*  HCT 25.1* 26.7* 26.0*  PLT 505*  --  459*   BMET  Recent Labs  04/17/16 1836 04/18/16 1124  NA 138 143  K 4.2 4.0  CL 102 107  CO2 23 24  GLUCOSE 270* 131*  BUN 91* 80*  CREATININE 2.69* 2.44*  CALCIUM 8.8* 8.7*   LFT  Recent Labs  04/18/16 1124  PROT 6.7  ALBUMIN 2.8*  AST 15  ALT 11*  ALKPHOS 104  BILITOT 0.4  BILIDIR <0.1*  IBILI NOT CALCULATED   PT/INR  Recent Labs  04/17/16 2327  LABPROT 14.6  INR 1.12   IMPRESSION:   #23  65 year old African-American female admitted last night after acute onset of chest pain and diaphoresis. Patient has mildly elevated troponins, 2-D echo is pending Pain consistent with angina, demand ischemia in a patient with history of coronary artery disease status post CABG in 2013 and history of congestive heart failure #2 prior history of CVA #3 hypertension #4onset diabetes mellitus #5 chronic kidney disease stage IV #6 Iron  deficiency anemia with Hemoccult positive stool, no overt bleeding. Rule out occult upper versus lower GI lesion  PLAN: Wait cardiac evaluation,results of echo pending and indication for more aggressive cardiac eval Would transfuse to keep her hemoglobin and 8-9 range- give one more unit Start oral iron supplementation She  on a twice a day PPI  Here  She will need colonoscopy and EGD this admission, we'll plan endoscopic evaluation after cleared from a cardiac  standpoint. Have advance diet in the interim  Amy Esterwood  04/18/2016, 2:11 PM    [facility] Attending   I have taken an interval history, reviewed the chart and examined the patient. I agree with the Advanced Practitioner's note, impression and recommendations.   Needs endoscopic evaluation of iron deficiency anemia - she thinks her chest pain is like her angina so I have held off on scheduling endoscopic evaluation for now but will f/u tomorrow.   Gatha Mayer, MD, [facility] [phone] (pager) [phone] after 5 PM, weekends and holidays  04/18/2016 5:54 PM

## 2016-04-18 NOTE — Progress Notes (Signed)
MD on call [place] Pink notified of pt's Renal US results. Hoover Brunette, RN

## 2016-04-18 NOTE — Progress Notes (Addendum)
Inpatient Diabetes Program Recommendations  AACE/ADA: New Consensus Statement on Inpatient Glycemic Control (2015)  Target Ranges:  Prepandial:   less than 140 mg/dL      Peak postprandial:   less than 180 mg/dL (1-2 hours)      Critically ill patients:  140 - 180 mg/dL   Results for DONA, TOMASSO (MRN [id number]) as of 04/18/2016 09:01  Ref. Range 04/18/2016 07:30 04/18/2016 08:00  Glucose-Capillary Latest Ref Range: 65-99 mg/dL 54 (L) 75    Admit with: CP/ SOB/ Anemia  History: DM, CHF, CABG, CKD  Home DM Meds: Lantus 25 units QHS       Amaryl 4 mg daily  Current Insulin Orders: Novolog Moderate Correction Scale/ SSI (0-15 units) TID AC       MD- Patient with Hypoglycemia this AM after receiving 20 units Lantus last PM.  Note Lantus discontinued.  Patient may need a portion of home Lantus?   Please consider restarting Lantus at 50% of home dose- Lantus 12 units QHS   Note current A1c pending but question accuracy of results given patient's Hemoglobin was only 7.3 on admission?     --Will follow patient during hospitalization--  Wyn Quaker RN, MSN, CDE Diabetes Coordinator Inpatient Glycemic Control Team Team Pager: [phone] (8a-5p)

## 2016-04-18 NOTE — Progress Notes (Signed)
  Echocardiogram 2D Echocardiogram has been performed.  [name] [name] 04/18/2016, 5:30 PM

## 2016-04-18 NOTE — Progress Notes (Addendum)
[facility] PROGRESS NOTE  [name] [name] [id number] DOB: 10/14/1951 DOA: 04/17/2016  PCP: Rogers Blocker, MD  Brief HPI: 65 year old African-American female with a past medical history of coronary artery disease status post CABG, chronic kidney disease stage IV, chronic combined systolic and diastolic CHF, diabetes on insulin, history of cocaine abuse, presented with left-sided chest pain with diaphoresis and dyspnea. She was also found to be anemic. She was transfused 1 unit of blood. She was found to have Hemoccult positive stool. She was hospitalized for further management.  Past medical history:  Past Medical History  Diagnosis Date  . Essential hypertension   . Diabetes mellitus     diagnosed in 2008  . Glaucoma   . Hyperlipidemia   . CVA (cerebral infarction)     a. right internal capsule stroke in 12/2006  . Left-sided sensory deficit present   . Cocaine abuse     crack cocaine heavily until 2008 then sporadic use since then  . Tobacco abuse   . Thyroid nodule     FNA in [id number] showed follicular cells but not definate neoplasm  . Coronary artery disease     a. 06/2012 NSTEMI/CABG x 3 (LIMA->LAD, VG->OM2, VG->LCX);  b. 04/2015 MV: EF<30%, mid ant, apicalanterior, apical infarct;  c. 04/2015 Cath: LM nl, LAD 90p, LCX 31m, OM1 min irregs, RCA mild dzs, LIMA->LAD nl w/ dist LAD dzs, VG->OM2 nl, VG->LCX nl-->Med Rx.  . Arthritis of left shoulder region 03/23/2013  . Chronic combined systolic and diastolic CHF (congestive heart failure) (HCC)     a. EF 40-45%, mild LVH, mid apicalanteroseptal and apical HK.  Marland Kitchen PVD (peripheral vascular disease) ([place])     a. 06/2012 ABI's: R - 0.73, L - 0.71.  Marland Kitchen CKD (chronic kidney disease), stage III     Consultants: Cardiology. Gastroenterology  Procedures: None  Antibiotics: None  Subjective: Patient continues to have chest pain in the left side. 4-5 out of 10 in intensity this morning. Denies any shortness of breath. Denies any black  stools or blood in stools recently. No nausea, vomiting.  Objective:  Vital Signs  Filed Vitals:   04/18/16 0300 04/18/16 0304 04/18/16 0700 04/18/16 1125  BP:  158/64 148/75 143/78  Pulse:  79 80 85  Temp:      TempSrc:      Resp:  14 16 10   Height:      Weight: 125.51 kg (276 lb 11.2 oz)     SpO2:  100% 98% 97%    Intake/Output Summary (Last 24 hours) at 04/18/16 1136 Last data filed at 04/18/16 0600  Gross per 24 hour  Intake    745 ml  Output    600 ml  Net    145 ml   Filed Weights   04/17/16 1750 04/17/16 2300 04/18/16 0300  Weight: 117.482 kg (259 lb) 125.374 kg (276 lb 6.4 oz) 125.51 kg (276 lb 11.2 oz)    General appearance: alert, cooperative, appears stated age and no distress Resp: clear to auscultation bilaterally Cardio: regular rate and rhythm, S1, S2 normal, no murmur, click, rub or gallop GI: Abdomen is soft. Tender in the epigastric area without any rebound, rigidity or guarding. No masses or organomegaly. Bowel sounds are present. Extremities: extremities normal, atraumatic, no cyanosis or edema Neurologic: Awake and alert. Oriented 3. No focal neurological deficits.  Lab Results:  Data Reviewed: I have personally reviewed following labs and imaging studies  CBC:  Recent Labs Lab 04/17/16 1836 04/18/16  0512  WBC 11.7*  --   NEUTROABS 7.9*  --   HGB 7.3* 7.8*  HCT 25.1* 26.7*  MCV 76.1*  --   PLT 505*  --    Basic Metabolic Panel:  Recent Labs Lab 04/17/16 1836  NA 138  K 4.2  CL 102  CO2 23  GLUCOSE 270*  BUN 91*  CREATININE 2.69*  CALCIUM 8.8*   GFR: Estimated Creatinine Clearance: 29.1 mL/min (by C-G formula based on Cr of 2.69). Liver Function Tests: No results for input(s): AST, ALT, ALKPHOS, BILITOT, PROT, ALBUMIN in the last 168 hours. No results for input(s): LIPASE, AMYLASE in the last 168 hours. No results for input(s): AMMONIA in the last 168 hours. Coagulation Profile:  Recent Labs Lab 04/17/16 2327  INR  1.12   Cardiac Enzymes:  Recent Labs Lab 04/17/16 2327 04/18/16 0512  TROPONINI 0.04* 0.07*    CBG:  Recent Labs Lab 04/17/16 2308 04/18/16 0730 04/18/16 0800 04/18/16 0935  GLUCAP 159* 54* 71 185*   Lipid Profile:  Recent Labs  04/18/16 0512  CHOL 138  HDL 28*  LDLCALC 87  TRIG 113  CHOLHDL 4.9   Urine analysis:    Component Value Date/Time   COLORURINE YELLOW 04/18/2016 [place] 04/18/2016 0332   LABSPEC 1.014 04/18/2016 0332   PHURINE 5.5 04/18/2016 0332   GLUCOSEU NEGATIVE 04/18/2016 0332   HGBUR NEGATIVE 04/18/2016 0332   BILIRUBINUR NEGATIVE 04/18/2016 0332   KETONESUR NEGATIVE 04/18/2016 0332   PROTEINUR NEGATIVE 04/18/2016 0332   UROBILINOGEN 0.2 05/12/2015 0938   NITRITE NEGATIVE 04/18/2016 0332   LEUKOCYTESUR NEGATIVE 04/18/2016 0332    Recent Results (from the past 240 hour(s))  MRSA PCR Screening     Status: None   Collection Time: 04/17/16 10:57 PM  Result Value Ref Range Status   MRSA by PCR NEGATIVE NEGATIVE Final    Comment:        The GeneXpert MRSA Assay (FDA approved for NASAL specimens only), is one component of a comprehensive MRSA colonization surveillance program. It is not intended to diagnose MRSA infection nor to guide or monitor treatment for MRSA infections.       Radiology Studies: Dg Chest 2 View  04/17/2016  CLINICAL DATA:  LEFT chest pain after eating today, more pain radiating to central chest, diaphoresis, diabetes mellitus, CHF, coronary artery disease, prior stroke, chronic kidney disease, hypertension EXAM: CHEST  2 VIEW COMPARISON:  01/13/2016 FINDINGS: Minimal enlargement of cardiac silhouette post CABG. Tortuous aorta. Mediastinal contours and pulmonary vascularity otherwise normal. Lungs clear. No infiltrate, pleural effusion, or pneumothorax. Bones demineralized. IMPRESSION: Minimal enlargement of cardiac silhouette post CABG. No acute abnormalities. Electronically Signed   By: Lavonia Dana  M.D.   On: 04/17/2016 18:25   US Renal  04/18/2016  CLINICAL DATA:  Acute renal failure. EXAM: RENAL / URINARY TRACT ULTRASOUND COMPLETE COMPARISON:  None. FINDINGS: Right Kidney: Length: 11.6 cm. Renal cortical echogenicity is within normal limits. Sonographer measures a 2.0 x 1.6 x 2.2 cm hypoechoic lesion in the interpolar right kidney without substantial enhanced through transmission. No hydronephrosis right kidney. Left Kidney: Length: 11.3 cm. Echogenicity within normal limits. No mass or hydronephrosis visualized. Bladder: Appears normal for degree of bladder distention. IMPRESSION: 1. No evidence for hydronephrosis. 2. 2.2 cm hypoechoic area in the interpolar region of the right kidney may represent a prominent column of Bertin, but renal neoplasm not completely excluded. MRI of the abdomen without and with contrast recommended to further evaluate.  These results will be called to the ordering clinician or representative by the Radiologist Assistant, and communication documented in the PACS or zVision Dashboard. Electronically Signed   By: Misty Stanley M.D.   On: 04/18/2016 08:35     Medications:  Scheduled: . brinzolamide  1 drop Both Eyes TID  . buPROPion  150 mg Oral BID  . gabapentin  300 mg Oral QHS  . insulin aspart  0-15 Units Subcutaneous TID WC  . latanoprost  1 drop Both Eyes QHS  . loratadine  10 mg Oral Daily  . mometasone-formoterol  2 puff Inhalation BID  . pantoprazole (PROTONIX) IV  40 mg Intravenous Q12H  . pravastatin  40 mg Oral QHS  . traMADol  50 mg Oral TID   Continuous:  [id number], morphine injection, nitroGLYCERIN, ondansetron (ZOFRAN) IV  Assessment/Plan:  Principal Problem:   Chest pain Active Problems:   Essential hypertension   Type 2 diabetes mellitus with renal manifestations, controlled (HCC)   Chronic combined systolic and diastolic CHF (congestive heart failure) (HCC)   CKD (chronic kidney disease) stage 4, GFR 15-29 ml/min (HCC)    Acute on chronic renal failure (HCC)   Microcytic anemia   Thrombocytosis (HCC)   Absolute anemia   Bleeding gastrointestinal   Insulin dependent diabetes mellitus ([place])    Chest pain in the setting of known CAD  Patient with minimal elevation in troponin. Cardiology has been consulted. No cocaine on urine drug screen. There could be a GI component to this chest pain. Patient had a cardiac catheterization last year which revealed patency of the grafts. Continue statin.   Acute on chronic microcytic anemia  Hemoglobin has been trending down over the last few months. Patient denies any overt bleeding. She thinks she may have had a colonoscopy a few years ago, but none noted on our electronic medical records. She is not followed by gastroenterology on a regular basis. Her BUN was significantly elevated. She did have positive Hemoccult stool. This raises the question of upper GI bleeding. She is tender in the epigastric area. Continue protonix. Discussed with gastroenterology, who will consult. Patient will likely need upper endoscopy once cleared by cardiology. Remain nothing by mouth for now. Patient was transfused 1 unit of blood with the some improvement in hemoglobin. Repeat CBC this afternoon. Anemia panel was done in January which revealed a ferritin of 26 and a TIBC of 342. B-12 level was normal. Folate was normal. Findings are suggestive of Iron deficiency.  Epigastric abdominal pain Most likely secondary to gastritis. Will check LFTs and lipase. No clear indication to obtain imaging at this time. PPI.  AKI on CKD stage IV SCr 2.69, up from apparent baseline of 2.20. Likely prerenal. Repeat labs today and tomorrow. Patient was gently hydrated overnight. Avoid excessive IV fluids due to history of CHF. Ultrasound of kidneys were done this morning. No hydronephrosis noted. It revealed a hypoechoic area in the right kidney. MRI was recommended. However, due to CKD would not be able to give  contrast. This will need outpatient follow-up.  CAD s/p CABG Underwent CABG in July 2013 with Dr. Prescott Gum. Continue medications as discussed above. Was not started back on beta blocker due to history of cocaine abuse. Urine drug screen is negative for cocaine. Should be able to resume metoprolol.  Chronic combined systolic/diastolic CHF  TTE ([id number]) with EF 123456, grade 2 diastolic dysfunction, mild MR. reasonably well compensated. Continue to monitor clinically. Lasix is on hold. Anticipate restart tomorrow.  Type II DM  Managed with Lantus 25 units qHS at home. Patient had an episode of hypoglycemia this morning. She has been nothing by mouth for possible procedure. She was given Lantus at a lower dose last night. Hold her Lantus for now. Continue to monitor her CBGs closely. CBG did improve after she was given D50.   Essential Hypertension  Blood pressure is reasonably well controlled. Continue to monitor.  Leukocytosis, thrombocytosis  Repeat levels are pending.  DVT Prophylaxis: SCDs    Code Status: Full code  Family Communication: Discussed with the patient  Disposition Plan: Await cardiology and gastroenterology input. Follow-up on CBC and renal function.    LOS: 1 day   [facility] Pager [phone] 04/18/2016, 11:36 AM  If 7PM-7AM, please contact night-coverage at [email or web address], password [facility]

## 2016-04-19 DIAGNOSIS — R079 Chest pain, unspecified: Secondary | ICD-10-CM | POA: Diagnosis not present

## 2016-04-19 DIAGNOSIS — N179 Acute kidney failure, unspecified: Secondary | ICD-10-CM | POA: Diagnosis not present

## 2016-04-19 DIAGNOSIS — K5520 Angiodysplasia of colon without hemorrhage: Secondary | ICD-10-CM | POA: Diagnosis not present

## 2016-04-19 DIAGNOSIS — E119 Type 2 diabetes mellitus without complications: Secondary | ICD-10-CM | POA: Diagnosis not present

## 2016-04-19 DIAGNOSIS — K449 Diaphragmatic hernia without obstruction or gangrene: Secondary | ICD-10-CM | POA: Diagnosis not present

## 2016-04-19 DIAGNOSIS — D509 Iron deficiency anemia, unspecified: Secondary | ICD-10-CM | POA: Diagnosis not present

## 2016-04-19 DIAGNOSIS — R195 Other fecal abnormalities: Secondary | ICD-10-CM | POA: Diagnosis not present

## 2016-04-19 LAB — TYPE AND SCREEN
ABO/RH(D): O POS
Antibody Screen: NEGATIVE
UNIT DIVISION: 0
Unit division: 0

## 2016-04-19 LAB — ECHOCARDIOGRAM COMPLETE
HEIGHTINCHES: 68 in
Weight: 4427.2 oz

## 2016-04-19 LAB — CBC
HEMATOCRIT: 27.7 % — AB (ref 36.0–46.0)
HEMOGLOBIN: 8.3 g/dL — AB (ref 12.0–15.0)
MCH: 23.3 pg — AB (ref 26.0–34.0)
MCHC: 30 g/dL (ref 30.0–36.0)
MCV: 77.8 fL — ABNORMAL LOW (ref 78.0–100.0)
Platelets: 415 10*3/uL — ABNORMAL HIGH (ref 150–400)
RBC: 3.56 MIL/uL — ABNORMAL LOW (ref 3.87–5.11)
RDW: 18.3 % — AB (ref 11.5–15.5)
WBC: 8.8 10*3/uL (ref 4.0–10.5)

## 2016-04-19 LAB — HEMOGLOBIN A1C
HEMOGLOBIN A1C: 5.6 % (ref 4.8–5.6)
Mean Plasma Glucose: 114 mg/dL

## 2016-04-19 LAB — GLUCOSE, CAPILLARY
GLUCOSE-CAPILLARY: 106 mg/dL — AB (ref 65–99)
GLUCOSE-CAPILLARY: 260 mg/dL — AB (ref 65–99)
GLUCOSE-CAPILLARY: 102 mg/dL — AB (ref 65–99)
Glucose-Capillary: 249 mg/dL — ABNORMAL HIGH (ref 65–99)

## 2016-04-19 LAB — BASIC METABOLIC PANEL
ANION GAP: 13 (ref 5–15)
BUN: 73 mg/dL — ABNORMAL HIGH (ref 6–20)
CALCIUM: 8.6 mg/dL — AB (ref 8.9–10.3)
CO2: 22 mmol/L (ref 22–32)
Chloride: 106 mmol/L (ref 101–111)
Creatinine, Ser: 2.52 mg/dL — ABNORMAL HIGH (ref 0.44–1.00)
GFR calc Af Amer: 22 mL/min — ABNORMAL LOW (ref >60)
GFR calc non Af Amer: 19 mL/min — ABNORMAL LOW (ref >60)
Glucose, Bld: 110 mg/dL — ABNORMAL HIGH (ref 65–99)
Potassium: 4.5 mmol/L (ref 3.5–5.1)
SODIUM: 141 mmol/L (ref 135–145)

## 2016-04-19 LAB — URINE CULTURE

## 2016-04-19 MED ORDER — METOCLOPRAMIDE HCL 5 MG/ML IJ SOLN
10.0000 mg | Freq: Once | INTRAMUSCULAR | Status: AC
  Administered 2016-04-19: 10 mg via INTRAVENOUS
  Filled 2016-04-19: qty 2

## 2016-04-19 MED ORDER — SODIUM CHLORIDE 0.9 % IV SOLN: INTRAVENOUS | Status: DC

## 2016-04-19 MED ORDER — PEG 3350-KCL-NA BICARB-NACL 420 G PO SOLR
2000.0000 mL | Freq: Once | ORAL | Status: AC
  Administered 2016-04-19: 2000 mL via ORAL
  Filled 2016-04-19: qty 4000

## 2016-04-19 MED ORDER — FUROSEMIDE 40 MG PO TABS
40.0000 mg | ORAL_TABLET | Freq: Every day | ORAL | Status: DC
  Administered 2016-04-19 – 2016-04-20 (×2): 40 mg via ORAL
  Filled 2016-04-19 (×2): qty 1

## 2016-04-19 MED ORDER — BISACODYL 5 MG PO TBEC
20.0000 mg | DELAYED_RELEASE_TABLET | Freq: Once | ORAL | Status: AC
  Administered 2016-04-19: 20 mg via ORAL
  Filled 2016-04-19: qty 4

## 2016-04-19 MED ORDER — POLYETHYLENE GLYCOL 3350 17 GM/SCOOP PO POWD
0.5000 | Freq: Once | ORAL | Status: DC
  Filled 2016-04-19: qty 255

## 2016-04-19 MED ORDER — INSULIN GLARGINE 100 UNIT/ML ~~LOC~~ SOLN
15.0000 [IU] | Freq: Every day | SUBCUTANEOUS | Status: DC
  Administered 2016-04-19 – 2016-04-21 (×3): 15 [IU] via SUBCUTANEOUS
  Filled 2016-04-19 (×3): qty 0.15

## 2016-04-19 NOTE — Progress Notes (Signed)
Formal echo result pending. If there has been a significant decline in EF or focal wall motion abnormalities, would consider ischemic evaluation. For now, would proceed with EGD.

## 2016-04-19 NOTE — Progress Notes (Signed)
     [facility] Progress Note     Assessment / Plan: #6 65 year old African-American female admitted last night after acute onset of chest pain and diaphoresis.  Pain consistent with angina, demand ischemia in a patient with history of coronary artery disease status post CABG in 2013 and history of congestive heart failure.  Undergoing cardiac evaluation. #2 prior history of CVA #3 hypertension #4 diabetes mellitus #5 chronic kidney disease stage IV #6 Irondeficiency anemia with Hemoccult positive stool, no overt bleeding. Rule out occult upper versus lower GI lesion.  She is s/p two units PRBC's.  Hgb stable this AM.   LOS: 2 days   ZEHR, JESSICA D.  04/19/2016, 10:08 AM  Pager number [place] Attending   I have taken an interval history, reviewed the chart and examined the patient. I agree with the Advanced Practitioner's note, impression and recommendations.   Chest pain is musculoskeletal - tender over left chest and ant shoulder area  Echo seems unchanged so will proceed w/ egd/colonoscopy tomorrow The risks and benefits as well as alternatives of endoscopic procedure(s) have been discussed and reviewed. All questions answered. The patient agrees to proceed.  Gatha Mayer, MD, [facility] [phone] (pager) [phone] after 5 PM, weekends and holidays  04/19/2016 1:20 PM    Subjective:  Feels ok.  Still with chest pain at 4/10 on pain scale.  Ate breakfast without any issues.  No BM or sign of active GI bleeding.  Objective:  Vital signs in last 24 hours: Temp:  [98.1 F (36.7 C)-98.8 F (37.1 C)] 98.1 F (36.7 C) (04/22 0800) Pulse Rate:  [73-89] 80 (04/22 0800) Resp:  [10-20] 19 (04/22 0800) BP: (99-155)/(57-78) 99/78 mmHg (04/22 0800) SpO2:  [97 %-100 %] 99 % (04/22 0904) Weight:  [275 lb 12.8 oz (125.102 kg)] 275 lb 12.8 oz (125.102 kg) (04/22 0500) Last BM Date: 04/16/16 General:  Alert,  Well-developed, in NAD Heart:  Regular rate and rhythm; no murmurs Pulm:  CTAB.  No W/R/R. Abdomen:  Soft, non-distended.  BS present.  Non-tender.  Extremities:  Without edema. Neurologic:  Alert and oriented x 4;  grossly normal neurologically. Psych:  Alert and cooperative. Normal mood and affect. Chest wall - tender on left  Intake/Output from previous day: 04/21 0701 - 04/22 0700 In: 745 [P.O.:360; I.V.:50; Blood:335] Out: 800 [Urine:800]  Lab Results:  Recent Labs  04/18/16 1124 04/18/16 1820 04/19/16 0410  WBC 10.5 9.5 8.8  HGB 7.6* 8.2* 8.3*  HCT 26.0* 27.9* 27.7*  PLT 459* 442* 415*   BMET  Recent Labs  04/17/16 1836 04/18/16 1124 04/19/16 0410  NA 138 143 141  K 4.2 4.0 4.5  CL 102 107 106  CO2 23 24 22   GLUCOSE 270* 131* 110*  BUN 91* 80* 73*  CREATININE 2.69* 2.44* 2.52*  CALCIUM 8.8* 8.7* 8.6*   LFT  Recent Labs  04/18/16 1124  PROT 6.7  ALBUMIN 2.8*  AST 15  ALT 11*  ALKPHOS 104  BILITOT 0.4  BILIDIR <0.1*  IBILI NOT CALCULATED

## 2016-04-19 NOTE — Progress Notes (Addendum)
[facility] PROGRESS NOTE  [name] [name] [id number] DOB: December 15, 1951 DOA: 04/17/2016  PCP: Rogers Blocker, MD  Brief HPI: 65 year old African-American female with a past medical history of coronary artery disease status post CABG, chronic kidney disease stage IV, chronic combined systolic and diastolic CHF, diabetes on insulin, history of cocaine abuse, presented with left-sided chest pain with diaphoresis and dyspnea. She was also found to be anemic. She was transfused 1 unit of blood. She was found to have Hemoccult positive stool. She was hospitalized for further management.  Past medical history:  Past Medical History  Diagnosis Date  . Essential hypertension   . Diabetes mellitus     diagnosed in 2008  . Glaucoma   . Hyperlipidemia   . CVA (cerebral infarction)     a. right internal capsule stroke in 12/2006  . Left-sided sensory deficit present   . Cocaine abuse     crack cocaine heavily until 2008 then sporadic use since then  . Tobacco abuse   . Thyroid nodule     FNA in [id number] showed follicular cells but not definate neoplasm  . Coronary artery disease     a. 06/2012 NSTEMI/CABG x 3 (LIMA->LAD, VG->OM2, VG->LCX);  b. 04/2015 MV: EF<30%, mid ant, apicalanterior, apical infarct;  c. 04/2015 Cath: LM nl, LAD 90p, LCX 69m, OM1 min irregs, RCA mild dzs, LIMA->LAD nl w/ dist LAD dzs, VG->OM2 nl, VG->LCX nl-->Med Rx.  . Arthritis of left shoulder region 03/23/2013  . Chronic combined systolic and diastolic CHF (congestive heart failure) (HCC)     a. EF 40-45%, mild LVH, mid apicalanteroseptal and apical HK.  Marland Kitchen PVD (peripheral vascular disease) ([place])     a. 06/2012 ABI's: R - 0.73, L - 0.71.  Marland Kitchen CKD (chronic kidney disease), stage III     Consultants: Cardiology. Gastroenterology  Procedures: Blood transfusion  Transthoracic echocardiogram Study Conclusions - Left ventricle: The cavity size was mildly dilated. Systolic function was mildly to moderately reduced. The estimated  ejection fraction was in the range of 40% to 45%. There is severe hypokinesis of the entireinferoseptal myocardium. There is akinesis of the basal-midinferior myocardium. There is akinesis of the apicalanterior myocardium. There is hypokinesis of the midanterior myocardium. Features are consistent with a pseudonormal left ventricular filling pattern, with concomitant abnormal relaxation and increased filling pressure (grade 2 diastolic dysfunction). Doppler parameters are consistent with high ventricular filling pressure. - Mitral valve: Calcified annulus. - Left atrium: The atrium was mildly dilated. - Atrial septum: There was increased thickness of the septum, consistent with lipomatous hypertrophy.  Antibiotics: None  Subjective: Patient continues to have 4 out of 10 chest pain in the site. Denies shortness of breath. Denies any nausea, vomiting. No abdominal pain. Denies any blood in the stool.   Objective:  Vital Signs  Filed Vitals:   04/19/16 0009 04/19/16 0500 04/19/16 0800 04/19/16 0904  BP: 128/57 142/67 99/78   Pulse: 73 73 80   Temp: 98.8 F (37.1 C) 98.4 F (36.9 C) 98.1 F (36.7 C)   TempSrc: Oral Oral Oral   Resp: 20 18 19    Height:      Weight:  125.102 kg (275 lb 12.8 oz)    SpO2: 100% 98% 100% 99%    Intake/Output Summary (Last 24 hours) at 04/19/16 1008 Last data filed at 04/19/16 0830  Gross per 24 hour  Intake    945 ml  Output    800 ml  Net    145 ml  Filed Weights   04/17/16 2300 04/18/16 0300 04/19/16 0500  Weight: 125.374 kg (276 lb 6.4 oz) 125.51 kg (276 lb 11.2 oz) 125.102 kg (275 lb 12.8 oz)    General appearance: alert, cooperative, appears stated age and no distress Resp: clear to auscultation bilaterally Cardio: regular rate and rhythm, S1, S2 normal, no murmur, click, rub or gallop Tender to palpation over left chest. It reproduces her symptoms. GI: Abdomen is soft. Remains Tender in the epigastric area without any rebound, rigidity or  guarding. No masses or organomegaly. Bowel sounds are present. Extremities: extremities normal, atraumatic, no cyanosis or edema Neurologic: Awake and alert. Oriented 3. No focal neurological deficits.  Lab Results:  Data Reviewed: I have personally reviewed following labs and imaging studies  CBC:  Recent Labs Lab 04/17/16 1836 04/18/16 0512 04/18/16 1124 04/18/16 1820 04/19/16 0410  WBC 11.7*  --  10.5 9.5 8.8  NEUTROABS 7.9*  --   --   --   --   HGB 7.3* 7.8* 7.6* 8.2* 8.3*  HCT 25.1* 26.7* 26.0* 27.9* 27.7*  MCV 76.1*  --  76.9* 77.9* 77.8*  PLT 505*  --  459* 442* [id number]*   Basic Metabolic Panel:  Recent Labs Lab 04/17/16 1836 04/18/16 1124 04/19/16 0410  NA 138 143 141  K 4.2 4.0 4.5  CL 102 107 106  CO2 23 24 22   GLUCOSE 270* 131* 110*  BUN 91* 80* 73*  CREATININE 2.69* 2.44* 2.52*  CALCIUM 8.8* 8.7* 8.6*   GFR: Estimated Creatinine Clearance: 31.1 mL/min (by C-G formula based on Cr of 2.52). Liver Function Tests:  Recent Labs Lab 04/18/16 1124  AST 15  ALT 11*  ALKPHOS 104  BILITOT 0.4  PROT 6.7  ALBUMIN 2.8*    Recent Labs Lab 04/18/16 1124  LIPASE 31   Coagulation Profile:  Recent Labs Lab 04/17/16 2327  INR 1.12   Cardiac Enzymes:  Recent Labs Lab 04/17/16 2327 04/18/16 0512 04/18/16 1124  TROPONINI 0.04* 0.07* 0.06*    CBG:  Recent Labs Lab 04/18/16 0935 04/18/16 1128 04/18/16 1636 04/18/16 2155 04/19/16 0744  GLUCAP 185* 127* 104* 265* 106*   Lipid Profile:  Recent Labs  04/18/16 0512  CHOL 138  HDL 28*  LDLCALC 87  TRIG 113  CHOLHDL 4.9   Urine analysis:    Component Value Date/Time   COLORURINE YELLOW 04/18/2016 [place] 04/18/2016 0332   LABSPEC 1.014 04/18/2016 0332   PHURINE 5.5 04/18/2016 0332   GLUCOSEU NEGATIVE 04/18/2016 0332   HGBUR NEGATIVE 04/18/2016 0332   BILIRUBINUR NEGATIVE 04/18/2016 0332   KETONESUR NEGATIVE 04/18/2016 0332   PROTEINUR NEGATIVE 04/18/2016 0332    UROBILINOGEN 0.2 05/12/2015 0938   NITRITE NEGATIVE 04/18/2016 0332   LEUKOCYTESUR NEGATIVE 04/18/2016 0332    Recent Results (from the past 240 hour(s))  MRSA PCR Screening     Status: None   Collection Time: 04/17/16 10:57 PM  Result Value Ref Range Status   MRSA by PCR NEGATIVE NEGATIVE Final    Comment:        The GeneXpert MRSA Assay (FDA approved for NASAL specimens only), is one component of a comprehensive MRSA colonization surveillance program. It is not intended to diagnose MRSA infection nor to guide or monitor treatment for MRSA infections.       Radiology Studies: Dg Chest 2 View  04/17/2016  CLINICAL DATA:  LEFT chest pain after eating today, more pain radiating to central chest, diaphoresis, diabetes mellitus, CHF, coronary  artery disease, prior stroke, chronic kidney disease, hypertension EXAM: CHEST  2 VIEW COMPARISON:  01/13/2016 FINDINGS: Minimal enlargement of cardiac silhouette post CABG. Tortuous aorta. Mediastinal contours and pulmonary vascularity otherwise normal. Lungs clear. No infiltrate, pleural effusion, or pneumothorax. Bones demineralized. IMPRESSION: Minimal enlargement of cardiac silhouette post CABG. No acute abnormalities. Electronically Signed   By: Lavonia Dana M.D.   On: 04/17/2016 18:25   US Renal  04/18/2016  CLINICAL DATA:  Acute renal failure. EXAM: RENAL / URINARY TRACT ULTRASOUND COMPLETE COMPARISON:  None. FINDINGS: Right Kidney: Length: 11.6 cm. Renal cortical echogenicity is within normal limits. Sonographer measures a 2.0 x 1.6 x 2.2 cm hypoechoic lesion in the interpolar right kidney without substantial enhanced through transmission. No hydronephrosis right kidney. Left Kidney: Length: 11.3 cm. Echogenicity within normal limits. No mass or hydronephrosis visualized. Bladder: Appears normal for degree of bladder distention. IMPRESSION: 1. No evidence for hydronephrosis. 2. 2.2 cm hypoechoic area in the interpolar region of the right kidney  may represent a prominent column of Bertin, but renal neoplasm not completely excluded. MRI of the abdomen without and with contrast recommended to further evaluate. These results will be called to the ordering clinician or representative by the Radiologist Assistant, and communication documented in the PACS or zVision Dashboard. Electronically Signed   By: Misty Stanley M.D.   On: 04/18/2016 08:35     Medications:  Scheduled: . sodium chloride   Intravenous Once  . brinzolamide  1 drop Both Eyes TID  . buPROPion  150 mg Oral BID  . furosemide  40 mg Oral Daily  . gabapentin  300 mg Oral QHS  . insulin aspart  0-15 Units Subcutaneous TID WC  . latanoprost  1 drop Both Eyes QHS  . loratadine  10 mg Oral Daily  . metoprolol tartrate  12.5 mg Oral BID  . mometasone-formoterol  2 puff Inhalation BID  . pantoprazole (PROTONIX) IV  40 mg Intravenous Q12H  . pravastatin  40 mg Oral QHS  . traMADol  50 mg Oral TID   Continuous:  [id number], morphine injection, nitroGLYCERIN, ondansetron (ZOFRAN) IV  Assessment/Plan:  Principal Problem:   Chest pain Active Problems:   Essential hypertension   Type 2 diabetes mellitus with renal manifestations, controlled (HCC)   Chronic combined systolic and diastolic CHF (congestive heart failure) (HCC)   CKD (chronic kidney disease) stage 4, GFR 15-29 ml/min (HCC)   Acute on chronic renal failure (HCC)   Microcytic anemia   Thrombocytosis (HCC)   Absolute anemia   Bleeding gastrointestinal   Insulin dependent diabetes mellitus (HCC)   Pain in the chest   Demand ischemia (HCC)   Iron deficiency anemia   Precordial pain    Chest pain in the setting of known CAD  Patient's symptoms appear to be musculoskeletal. She did have minimal elevation in troponin. Cardiology was consulted. 2-D echocardiogram was done. See report above. Systolic function appears to be similar to what it was back in January. Wall motion abnormalities noted. However,  diffuse hypokinesis was also noted in January. Cardiology to weigh in further. No cocaine on urine drug screen. There could be a GI component to this chest pain. Patient had a cardiac catheterization last year which revealed patency of the grafts. Continue statin.   Acute on chronic microcytic anemia/Iron deficiency  Hemoglobin has been trending down over the last few months. Patient denies any overt bleeding. She thinks she may have had a colonoscopy a few years ago, but none noted on  our electronic medical records. She is not followed by gastroenterology on a regular basis. Her BUN was significantly elevated. She did have positive Hemoccult stool. This raises the question of upper GI bleeding. She is tender in the epigastric area. Continue protonix. Gastroenterology is following. They plan to do EGD and colonoscopy once patient is cleared from cardiac standpoint. Patient has been transfused 2 units of blood. Hemoglobin has responded and remains stable. Anemia panel was done in January which revealed a ferritin of 26 and a TIBC of 342. B-12 level was normal. Folate was normal. Findings are suggestive of Iron deficiency.  Epigastric abdominal pain Most likely secondary to gastritis. LFTs and lipase normal. No clear indication to obtain imaging at this time. PPI.  AKI on CKD stage IV/right renal lesion SCr 2.69, up from apparent baseline of 2.20. Likely prerenal. Creatinine remains stable. Patient was gently hydrated. Avoid excessive IV fluids due to history of CHF. Ultrasound of kidneys were done. No hydronephrosis noted. Ultrasound revealed a hypoechoic area in the right kidney. MRI was recommended. However, due to CKD would not be able to give contrast. This will need outpatient follow-up.  CAD s/p CABG Underwent CABG in July 2013 with Dr. Prescott Gum. Continue medications as discussed above. Urine drug screen is negative for cocaine. Continue home medications.  Chronic combined systolic/diastolic  CHF  TTE ([id number]) with EF [id number], grade 2 diastolic dysfunction, mild MR. reasonably well compensated. Continue to monitor clinically. Lasix was on hold at the time of admission. Can be resumed.   Type II DM  Managed with Lantus 25 units qHS at home. Patient had an episode of hypoglycemia on the morning of 4/21. This was likely because she was nothing by mouth. Lantus was held yesterday. Blood glucose levels are elevated today. Resume Lantus at a lower dose. Continue to monitor her CBGs closely.  Essential Hypertension  Blood pressure is reasonably well controlled. Continue to monitor.  Leukocytosis, thrombocytosis  WBC and platelet counts are improved.  DVT Prophylaxis: SCDs    Code Status: Full code  Family Communication: Discussed with the patient  Disposition Plan: Await cardiology and gastroenterology input.     LOS: 2 days   [facility] Pager [phone] 04/19/2016, 10:08 AM  If 7PM-7AM, please contact night-coverage at [email or web address], password [facility]

## 2016-04-20 ENCOUNTER — Encounter (HOSPITAL_COMMUNITY)
Admission: EM | Disposition: A | Discharge status: Home / Self Care | Payer: Self-pay | Source: Home / Self Care | Attending: Emergency Medicine

## 2016-04-20 ENCOUNTER — Encounter (HOSPITAL_COMMUNITY): Payer: Self-pay

## 2016-04-20 DIAGNOSIS — I5042 Chronic combined systolic (congestive) and diastolic (congestive) heart failure: Secondary | ICD-10-CM | POA: Diagnosis not present

## 2016-04-20 DIAGNOSIS — N184 Chronic kidney disease, stage 4 (severe): Secondary | ICD-10-CM

## 2016-04-20 DIAGNOSIS — K449 Diaphragmatic hernia without obstruction or gangrene: Secondary | ICD-10-CM | POA: Diagnosis not present

## 2016-04-20 DIAGNOSIS — E1121 Type 2 diabetes mellitus with diabetic nephropathy: Secondary | ICD-10-CM

## 2016-04-20 DIAGNOSIS — K5520 Angiodysplasia of colon without hemorrhage: Secondary | ICD-10-CM | POA: Diagnosis not present

## 2016-04-20 DIAGNOSIS — D509 Iron deficiency anemia, unspecified: Secondary | ICD-10-CM | POA: Diagnosis not present

## 2016-04-20 DIAGNOSIS — R195 Other fecal abnormalities: Secondary | ICD-10-CM

## 2016-04-20 DIAGNOSIS — R079 Chest pain, unspecified: Secondary | ICD-10-CM | POA: Diagnosis not present

## 2016-04-20 HISTORY — PX: ESOPHAGOGASTRODUODENOSCOPY: SHX5428

## 2016-04-20 LAB — BASIC METABOLIC PANEL
ANION GAP: 13 (ref 5–15)
BUN: 63 mg/dL — AB (ref 6–20)
CALCIUM: 9.2 mg/dL (ref 8.9–10.3)
CO2: 24 mmol/L (ref 22–32)
CREATININE: 2.67 mg/dL — AB (ref 0.44–1.00)
Chloride: 104 mmol/L (ref 101–111)
GFR calc Af Amer: 20 mL/min — ABNORMAL LOW (ref >60)
GFR, EST NON AFRICAN AMERICAN: 18 mL/min — AB (ref >60)
GLUCOSE: 103 mg/dL — AB (ref 65–99)
Potassium: 4.7 mmol/L (ref 3.5–5.1)
Sodium: 141 mmol/L (ref 135–145)

## 2016-04-20 LAB — GLUCOSE, CAPILLARY
GLUCOSE-CAPILLARY: 125 mg/dL — AB (ref 65–99)
Glucose-Capillary: 106 mg/dL — ABNORMAL HIGH (ref 65–99)
Glucose-Capillary: 101 mg/dL — ABNORMAL HIGH (ref 65–99)
Glucose-Capillary: 175 mg/dL — ABNORMAL HIGH (ref 65–99)

## 2016-04-20 LAB — CBC
HCT: 29.6 % — ABNORMAL LOW (ref 36.0–46.0)
Hemoglobin: 8.9 g/dL — ABNORMAL LOW (ref 12.0–15.0)
MCH: 23.3 pg — AB (ref 26.0–34.0)
MCHC: 30.1 g/dL (ref 30.0–36.0)
MCV: 77.5 fL — AB (ref 78.0–100.0)
PLATELETS: 505 10*3/uL — AB (ref 150–400)
RBC: 3.82 MIL/uL — ABNORMAL LOW (ref 3.87–5.11)
RDW: 18.5 % — AB (ref 11.5–15.5)
WBC: 9.6 10*3/uL (ref 4.0–10.5)

## 2016-04-20 SURGERY — EGD (ESOPHAGOGASTRODUODENOSCOPY)
Anesthesia: Moderate Sedation

## 2016-04-20 MED ORDER — FENTANYL CITRATE (PF) 100 MCG/2ML IJ SOLN
INTRAMUSCULAR | Status: AC
  Filled 2016-04-20: qty 2

## 2016-04-20 MED ORDER — FENTANYL CITRATE (PF) 100 MCG/2ML IJ SOLN
INTRAMUSCULAR | Status: DC | PRN
  Administered 2016-04-20 (×2): 25 ug via INTRAVENOUS

## 2016-04-20 MED ORDER — MIDAZOLAM HCL 5 MG/ML IJ SOLN
INTRAMUSCULAR | Status: AC
  Filled 2016-04-20: qty 2

## 2016-04-20 MED ORDER — METOCLOPRAMIDE HCL 5 MG/ML IJ SOLN
10.0000 mg | Freq: Once | INTRAMUSCULAR | Status: AC
  Administered 2016-04-20: 10 mg via INTRAVENOUS
  Filled 2016-04-20: qty 2

## 2016-04-20 MED ORDER — DIPHENHYDRAMINE HCL 50 MG/ML IJ SOLN
INTRAMUSCULAR | Status: DC | PRN
  Administered 2016-04-20: 25 mg via INTRAVENOUS

## 2016-04-20 MED ORDER — MIDAZOLAM HCL 10 MG/2ML IJ SOLN
INTRAMUSCULAR | Status: DC | PRN
  Administered 2016-04-20: 1 mg via INTRAVENOUS
  Administered 2016-04-20: 2 mg via INTRAVENOUS

## 2016-04-20 MED ORDER — SODIUM CHLORIDE 0.9 % IV SOLN
INTRAVENOUS | Status: DC
  Administered 2016-04-21: 07:00:00 via INTRAVENOUS

## 2016-04-20 MED ORDER — DIPHENHYDRAMINE HCL 50 MG/ML IJ SOLN
INTRAMUSCULAR | Status: AC
Start: 2016-04-20 — End: 2016-04-20
  Filled 2016-04-20: qty 1

## 2016-04-20 MED ORDER — BISACODYL 5 MG PO TBEC
20.0000 mg | DELAYED_RELEASE_TABLET | Freq: Once | ORAL | Status: AC
Start: 2016-04-20 — End: 2016-04-20
  Administered 2016-04-20: 20 mg via ORAL
  Filled 2016-04-20: qty 4

## 2016-04-20 MED ORDER — PEG-KCL-NACL-NASULF-NA ASC-C 100 G PO SOLR
0.5000 | Freq: Once | ORAL | Status: AC
  Administered 2016-04-20: 100 g via ORAL
  Filled 2016-04-20: qty 1

## 2016-04-20 MED ORDER — BUTAMBEN-TETRACAINE-BENZOCAINE 2-2-14 % EX AERO
INHALATION_SPRAY | CUTANEOUS | Status: DC | PRN
  Administered 2016-04-20: 2 via TOPICAL

## 2016-04-20 MED ORDER — PEG-KCL-NACL-NASULF-NA ASC-C 100 G PO SOLR
0.5000 | Freq: Once | ORAL | Status: AC
  Administered 2016-04-20: 100 g via ORAL

## 2016-04-20 NOTE — Progress Notes (Signed)
[facility] PROGRESS NOTE  [name] [name] [id number] DOB: Nov 30, 1951 DOA: 04/17/2016  PCP: Rogers Blocker, MD  Brief HPI: 65 year old African-American female with a past medical history of coronary artery disease status post CABG, chronic kidney disease stage IV, chronic combined systolic and diastolic CHF, diabetes on insulin, history of cocaine abuse, presented with left-sided chest pain with diaphoresis and dyspnea. She was also found to be anemic. She was transfused 1 unit of blood. She was found to have Hemoccult positive stool. She was hospitalized for further management.  Past medical history:  Past Medical History  Diagnosis Date  . Essential hypertension   . Diabetes mellitus     diagnosed in 2008  . Glaucoma   . Hyperlipidemia   . CVA (cerebral infarction)     a. right internal capsule stroke in 12/2006  . Left-sided sensory deficit present   . Cocaine abuse     crack cocaine heavily until 2008 then sporadic use since then  . Tobacco abuse   . Thyroid nodule     FNA in [id number] showed follicular cells but not definate neoplasm  . Coronary artery disease     a. 06/2012 NSTEMI/CABG x 3 (LIMA->LAD, VG->OM2, VG->LCX);  b. 04/2015 MV: EF<30%, mid ant, apicalanterior, apical infarct;  c. 04/2015 Cath: LM nl, LAD 90p, LCX 38m, OM1 min irregs, RCA mild dzs, LIMA->LAD nl w/ dist LAD dzs, VG->OM2 nl, VG->LCX nl-->Med Rx.  . Arthritis of left shoulder region 03/23/2013  . Chronic combined systolic and diastolic CHF (congestive heart failure) (HCC)     a. EF 40-45%, mild LVH, mid apicalanteroseptal and apical HK.  Marland Kitchen PVD (peripheral vascular disease) ([place])     a. 06/2012 ABI's: R - 0.73, L - 0.71.  Marland Kitchen CKD (chronic kidney disease), stage III     Consultants: Cardiology. Gastroenterology  Procedures: Blood transfusion  Transthoracic echocardiogram Study Conclusions - Left ventricle: The cavity size was mildly dilated. Systolic function was mildly to moderately reduced. The estimated  ejection fraction was in the range of 40% to 45%. There is severe hypokinesis of the entireinferoseptal myocardium. There is akinesis of the basal-midinferior myocardium. There is akinesis of the apicalanterior myocardium. There is hypokinesis of the midanterior myocardium. Features are consistent with a pseudonormal left ventricular filling pattern, with concomitant abnormal relaxation and increased filling pressure (grade 2 diastolic dysfunction). Doppler parameters are consistent with high ventricular filling pressure. - Mitral valve: Calcified annulus. - Left atrium: The atrium was mildly dilated. - Atrial septum: There was increased thickness of the septum, consistent with lipomatous hypertrophy.  Antibiotics: None  Subjective: Patient feels better this morning. She tolerated her bowel prep well overnight. Denies any chest pain this morning. No shortness of breath. No nausea or vomiting.   Objective:  Vital Signs  Filed Vitals:   04/19/16 1116 04/19/16 1300 04/19/16 2033 04/20/16 0508  BP: 120/70 133/59 145/65 145/62  Pulse: 80 64 80 71  Temp:  98.1 F (36.7 C) 98.3 F (36.8 C) 98.6 F (37 C)  TempSrc:  Oral Oral   Resp:  19 20 21   Height:      Weight:    125.873 kg (277 lb 8 oz)  SpO2:  97% 99% 97%    Intake/Output Summary (Last 24 hours) at 04/20/16 0752 Last data filed at 04/19/16 1813  Gross per 24 hour  Intake    556 ml  Output      0 ml  Net    556 ml   Danley Danker  Weights   04/18/16 0300 04/19/16 0500 04/20/16 0508  Weight: 125.51 kg (276 lb 11.2 oz) 125.102 kg (275 lb 12.8 oz) 125.873 kg (277 lb 8 oz)    General appearance: alert, cooperative, appears stated age and no distress Resp: clear to auscultation bilaterally Cardio: regular rate and rhythm, S1, S2 normal, no murmur, click, rub or gallop Tender to palpation over left chest. It reproduces her symptoms. GI: Abdomen is soft. Mildly Tender in the epigastric area without any rebound, rigidity or guarding. No  masses or organomegaly. Bowel sounds are present. Neurologic: Awake and alert. Oriented 3. No focal neurological deficits.  Lab Results:  Data Reviewed: I have personally reviewed following labs and imaging studies  CBC:  Recent Labs Lab 04/17/16 1836 04/18/16 0512 04/18/16 1124 04/18/16 1820 04/19/16 0410 04/20/16 0545  WBC 11.7*  --  10.5 9.5 8.8 9.6  NEUTROABS 7.9*  --   --   --   --   --   HGB 7.3* 7.8* 7.6* 8.2* 8.3* 8.9*  HCT 25.1* 26.7* 26.0* 27.9* 27.7* 29.6*  MCV 76.1*  --  76.9* 77.9* 77.8* 77.5*  PLT 505*  --  459* 442* 415* [id number]*   Basic Metabolic Panel:  Recent Labs Lab 04/17/16 1836 04/18/16 1124 04/19/16 0410 04/20/16 0545  NA 138 143 141 141  K 4.2 4.0 4.5 4.7  CL 102 107 106 104  CO2 23 24 22 24   GLUCOSE 270* 131* 110* 103*  BUN 91* 80* 73* 63*  CREATININE 2.69* 2.44* 2.52* 2.67*  CALCIUM 8.8* 8.7* 8.6* 9.2   GFR: Estimated Creatinine Clearance: 29.4 mL/min (by C-G formula based on Cr of 2.67). Liver Function Tests:  Recent Labs Lab 04/18/16 1124  AST 15  ALT 11*  ALKPHOS 104  BILITOT 0.4  PROT 6.7  ALBUMIN 2.8*    Recent Labs Lab 04/18/16 1124  LIPASE 31   Coagulation Profile:  Recent Labs Lab 04/17/16 2327  INR 1.12   Cardiac Enzymes:  Recent Labs Lab 04/17/16 2327 04/18/16 0512 04/18/16 1124  TROPONINI 0.04* 0.07* 0.06*    CBG:  Recent Labs Lab 04/18/16 2155 04/19/16 0744 04/19/16 1152 04/19/16 1634 04/19/16 2037  GLUCAP 265* 106* 260* 249* 102*   Lipid Profile:  Recent Labs  04/18/16 0512  CHOL 138  HDL 28*  LDLCALC 87  TRIG 113  CHOLHDL 4.9   Urine analysis:    Component Value Date/Time   COLORURINE YELLOW 04/18/2016 [place] 04/18/2016 0332   LABSPEC 1.014 04/18/2016 0332   PHURINE 5.5 04/18/2016 0332   GLUCOSEU NEGATIVE 04/18/2016 0332   HGBUR NEGATIVE 04/18/2016 0332   BILIRUBINUR NEGATIVE 04/18/2016 0332   KETONESUR NEGATIVE 04/18/2016 0332   PROTEINUR NEGATIVE  04/18/2016 0332   UROBILINOGEN 0.2 05/12/2015 0938   NITRITE NEGATIVE 04/18/2016 0332   LEUKOCYTESUR NEGATIVE 04/18/2016 0332    Recent Results (from the past 240 hour(s))  MRSA PCR Screening     Status: None   Collection Time: 04/17/16 10:57 PM  Result Value Ref Range Status   MRSA by PCR NEGATIVE NEGATIVE Final    Comment:        The GeneXpert MRSA Assay (FDA approved for NASAL specimens only), is one component of a comprehensive MRSA colonization surveillance program. It is not intended to diagnose MRSA infection nor to guide or monitor treatment for MRSA infections.   Culture, Urine     Status: None   Collection Time: 04/18/16  3:32 AM  Result Value Ref Range Status  Specimen Description URINE, RANDOM  Final   Special Requests NONE  Final   Culture MULTIPLE SPECIES PRESENT, SUGGEST RECOLLECTION  Final   Report Status 04/19/2016 FINAL  Final      Radiology Studies: US Renal  04/18/2016  CLINICAL DATA:  Acute renal failure. EXAM: RENAL / URINARY TRACT ULTRASOUND COMPLETE COMPARISON:  None. FINDINGS: Right Kidney: Length: 11.6 cm. Renal cortical echogenicity is within normal limits. Sonographer measures a 2.0 x 1.6 x 2.2 cm hypoechoic lesion in the interpolar right kidney without substantial enhanced through transmission. No hydronephrosis right kidney. Left Kidney: Length: 11.3 cm. Echogenicity within normal limits. No mass or hydronephrosis visualized. Bladder: Appears normal for degree of bladder distention. IMPRESSION: 1. No evidence for hydronephrosis. 2. 2.2 cm hypoechoic area in the interpolar region of the right kidney may represent a prominent column of Bertin, but renal neoplasm not completely excluded. MRI of the abdomen without and with contrast recommended to further evaluate. These results will be called to the ordering clinician or representative by the Radiologist Assistant, and communication documented in the PACS or zVision Dashboard. Electronically Signed   By:  Misty Stanley M.D.   On: 04/18/2016 08:35     Medications:  Scheduled: . sodium chloride   Intravenous Once  . brinzolamide  1 drop Both Eyes TID  . buPROPion  150 mg Oral BID  . furosemide  40 mg Oral Daily  . gabapentin  300 mg Oral QHS  . insulin aspart  0-15 Units Subcutaneous TID WC  . insulin glargine  15 Units Subcutaneous Daily  . latanoprost  1 drop Both Eyes QHS  . loratadine  10 mg Oral Daily  . metoprolol tartrate  12.5 mg Oral BID  . mometasone-formoterol  2 puff Inhalation BID  . pantoprazole (PROTONIX) IV  40 mg Intravenous Q12H  . pravastatin  40 mg Oral QHS  . traMADol  50 mg Oral TID   Continuous: . sodium chloride     [id number], morphine injection, nitroGLYCERIN, ondansetron (ZOFRAN) IV  Assessment/Plan:  Principal Problem:   Chest pain Active Problems:   Essential hypertension   Type 2 diabetes mellitus with renal manifestations, controlled (HCC)   Chronic combined systolic and diastolic CHF (congestive heart failure) (HCC)   CKD (chronic kidney disease) stage 4, GFR 15-29 ml/min (HCC)   Acute on chronic renal failure (HCC)   Microcytic anemia   Thrombocytosis (HCC)   Absolute anemia   Bleeding gastrointestinal   Insulin dependent diabetes mellitus (HCC)   Pain in the chest   Demand ischemia (HCC)   Iron deficiency anemia   Precordial pain    Chest pain in the setting of known CAD  Patient's symptoms appear to be musculoskeletal. She did have minimal elevation in troponin. Cardiology was consulted. 2-D echocardiogram was done. See report above. Systolic function appears to be similar to what it was back in January. Wall motion abnormalities noted. However, diffuse hypokinesis was also noted in January. Cardiology to weigh in further. No cocaine on urine drug screen. There could be a GI component to this chest pain as well. Patient had a cardiac catheterization last year which revealed patency of the grafts. Continue statin.   Acute on  chronic microcytic anemia/Iron deficiency  Hemoglobin has been trending down over the last few months. Patient denies any overt bleeding. She thinks she may have had a colonoscopy a few years ago, but none noted on our electronic medical records. She is not followed by gastroenterology on a regular basis. Her  BUN was significantly elevated. She did have positive Hemoccult stool. This raises the question of upper GI bleeding. She is tender in the epigastric area. Continue protonix. Gastroenterology is following. Plan is to do EGD and colonoscopy today. Patient has been transfused 2 units of blood. Hemoglobin has responded and remains stable. Anemia panel was done in January which revealed a ferritin of 26 and a TIBC of 342. B-12 level was normal. Folate was normal. Findings are suggestive of Iron deficiency. Will benefit from iron supplementation at discharge.  Epigastric abdominal pain Most likely secondary to gastritis. LFTs and lipase normal. No clear indication to obtain imaging at this time. PPI.  AKI on CKD stage IV/right renal lesion Serum creatinine is elevated but stable. Patient was gently hydrated. Avoid excessive IV fluids due to history of CHF. Ultrasound of kidneys were done. No hydronephrosis noted. Ultrasound revealed a hypoechoic area in the right kidney. MRI was recommended. However, due to CKD would not be able to give contrast. This will need outpatient follow-up. Patient is followed by nephrology at [facility]. She has an appointment early next month.  CAD s/p CABG Underwent CABG in July 2013 by Dr. Prescott Gum. Continue medications as discussed above. Urine drug screen is negative for cocaine. Continue home medications. Stable.  Chronic combined systolic/diastolic CHF  TTE ([id number]) with EF [id number], grade 2 diastolic dysfunction, mild MR. reasonably well compensated. Continue to monitor clinically. Lasix was on hold at the time of admission. This was resumed.    Type II DM  Managed with Lantus 25 units qHS at home. Patient had an episode of hypoglycemia on the morning of 4/21. This was likely because she was nothing by mouth. Lantus was held. Blood glucose levels noted to be elevated and so Lantus was resumed at a lower dose. Continue to monitor her CBGs closely.  Essential Hypertension  Blood pressure is reasonably well controlled. Continue to monitor.  Leukocytosis, thrombocytosis  WBC and platelet counts are improved.  DVT Prophylaxis: SCDs    Code Status: Full code  Family Communication: Discussed with the patient  Disposition Plan: Await EGD and colonoscopy today. Anticipate discharge tomorrow if no other cardiac workup is planned.    LOS: 3 days   [facility] Pager [phone] 04/20/2016, 7:52 AM  If 7PM-7AM, please contact night-coverage at [email or web address], password [email or web address]

## 2016-04-20 NOTE — Op Note (Signed)
[facility] Patient Name: [name] [name] Procedure Date : 04/20/2016 MRN: [id number] Attending MD: Gatha Mayer , MD Date of Birth: 1951/03/16 CSN: [id number] Age: 65 Admit Type: Inpatient Procedure:                Upper GI endoscopy Indications:              Iron deficiency anemia, Heme positive stool Providers:                Gatha Mayer, MD, Dustin Flock, RN, Ralene Bathe,                            Technician, Cherylynn Ridges, Technician Referring MD:              Medicines:                Midazolam 3 mg IV, Fentanyl 50 micrograms IV,                            Diphenhydramine 25 mg IV Complications:            No immediate complications. Estimated Blood Loss:     Estimated blood loss: none. Procedure:                Pre-Anesthesia Assessment:                           - Prior to the procedure, a History and Physical                            was performed, and patient medications and                            allergies were reviewed. The patient's tolerance of                            previous anesthesia was also reviewed. The risks                            and benefits of the procedure and the sedation                            options and risks were discussed with the patient.                            All questions were answered, and informed consent                            was obtained. Prior Anticoagulants: The patient                            last took aspirin 2 days prior to the procedure.                            ASA Grade Assessment: III - A patient with severe  systemic disease. After reviewing the risks and                            benefits, the patient was deemed in satisfactory                            condition to undergo the procedure.                           After obtaining informed consent, the endoscope was                            passed under direct vision. Throughout the   procedure, the patient's blood pressure, pulse, and                            oxygen saturations were monitored continuously. The                            [id number]) scope was introduced through the                            mouth, and advanced to the second part of duodenum.                            The upper GI endoscopy was accomplished without                            difficulty. The patient tolerated the procedure                            well. Scope In: Scope Out: Findings:      A medium-sized hiatal hernia was present.      The exam was otherwise without abnormality.      The cardia and gastric fundus were normal on retroflexion. Impression:               - Medium-sized hiatal hernia.                           - The examination was otherwise normal.                           - No specimens collected. Moderate Sedation:      Moderate (conscious) sedation was administered by the endoscopy nurse       and supervised by the endoscopist. The following parameters were       monitored: oxygen saturation, heart rate, blood pressure, and response       to care. Total physician intraservice time was 15 minutes. Recommendation:           - Return patient to hospital ward for ongoing care.                           - Clear liquid diet.                           -  Continue present medications.                           - Perform a colonoscopy tomorrow.                           - Semi-formed brown stool passed during EGD so                            colonoscopy not attempted today - needs to reprep                            and will be scheduled for tomorrow w/ Dr. Henrene Pastor Procedure Code(s):        --- Professional ---                           [phone], Esophagogastroduodenoscopy, flexible,                            transoral; diagnostic, including collection of                            specimen(s) by brushing or washing, when performed                             (separate procedure) Diagnosis Code(s):        --- Professional ---                           K44.9, Diaphragmatic hernia without obstruction or                            gangrene                           D50.9, Iron deficiency anemia, unspecified                           R19.5, Other fecal abnormalities CPT copyright 2016 American [employer] Association. All rights reserved. The codes documented in this report are preliminary and upon coder review may  be revised to meet current compliance requirements. Gatha Mayer, MD 04/20/2016 10:50:53 AM This report has been signed electronically. Number of Addenda: 0

## 2016-04-20 NOTE — Progress Notes (Signed)
   EGD - small hiatal hernia  Colonoscopy - not attempted - not adequately prepped   Colonoscopy tomorrow 1130 Dr. Alvina Chou, MD, [facility]astroenterology [phone] (pager) [phone] after 5 PM, weekends and holidays  04/20/2016 10:54 AM

## 2016-04-21 ENCOUNTER — Observation Stay (HOSPITAL_COMMUNITY): Payer: Medicare Other | Admitting: Certified Registered Nurse Anesthetist

## 2016-04-21 ENCOUNTER — Encounter (HOSPITAL_COMMUNITY)
Admission: EM | Disposition: A | Discharge status: Home / Self Care | Payer: Self-pay | Source: Home / Self Care | Attending: Emergency Medicine

## 2016-04-21 ENCOUNTER — Encounter (HOSPITAL_COMMUNITY): Payer: Self-pay

## 2016-04-21 DIAGNOSIS — D509 Iron deficiency anemia, unspecified: Secondary | ICD-10-CM

## 2016-04-21 DIAGNOSIS — R195 Other fecal abnormalities: Secondary | ICD-10-CM

## 2016-04-21 DIAGNOSIS — K449 Diaphragmatic hernia without obstruction or gangrene: Secondary | ICD-10-CM | POA: Diagnosis not present

## 2016-04-21 DIAGNOSIS — K5520 Angiodysplasia of colon without hemorrhage: Secondary | ICD-10-CM | POA: Diagnosis not present

## 2016-04-21 HISTORY — PX: COLONOSCOPY WITH PROPOFOL: SHX5780

## 2016-04-21 LAB — BASIC METABOLIC PANEL
ANION GAP: 13 (ref 5–15)
BUN: 51 mg/dL — ABNORMAL HIGH (ref 6–20)
CALCIUM: 8.9 mg/dL (ref 8.9–10.3)
CO2: 24 mmol/L (ref 22–32)
CREATININE: 2.66 mg/dL — AB (ref 0.44–1.00)
Chloride: 106 mmol/L (ref 101–111)
GFR, EST AFRICAN AMERICAN: 21 mL/min — AB (ref >60)
GFR, EST NON AFRICAN AMERICAN: 18 mL/min — AB (ref >60)
Glucose, Bld: 94 mg/dL (ref 65–99)
Potassium: 4 mmol/L (ref 3.5–5.1)
SODIUM: 143 mmol/L (ref 135–145)

## 2016-04-21 LAB — GLUCOSE, CAPILLARY
GLUCOSE-CAPILLARY: 87 mg/dL (ref 65–99)
Glucose-Capillary: 195 mg/dL — ABNORMAL HIGH (ref 65–99)

## 2016-04-21 LAB — CBC
HEMATOCRIT: 27.6 % — AB (ref 36.0–46.0)
Hemoglobin: 8.2 g/dL — ABNORMAL LOW (ref 12.0–15.0)
MCH: 23.1 pg — ABNORMAL LOW (ref 26.0–34.0)
MCHC: 29.7 g/dL — AB (ref 30.0–36.0)
MCV: 77.7 fL — ABNORMAL LOW (ref 78.0–100.0)
PLATELETS: 431 10*3/uL — AB (ref 150–400)
RBC: 3.55 MIL/uL — ABNORMAL LOW (ref 3.87–5.11)
RDW: 18.7 % — AB (ref 11.5–15.5)
WBC: 7.6 10*3/uL (ref 4.0–10.5)

## 2016-04-21 SURGERY — COLONOSCOPY WITH PROPOFOL
Anesthesia: Monitor Anesthesia Care

## 2016-04-21 MED ORDER — FERROUS SULFATE 325 (65 FE) MG PO TABS: 325.0000 mg | ORAL_TABLET | Freq: Two times a day (BID) | ORAL | Status: DC

## 2016-04-21 MED ORDER — METOCLOPRAMIDE HCL 5 MG/ML IJ SOLN: 10.0000 mg | Freq: Once | INTRAMUSCULAR | Status: DC | PRN

## 2016-04-21 MED ORDER — LACTATED RINGERS IV SOLN: INTRAVENOUS | Status: DC

## 2016-04-21 MED ORDER — FENTANYL CITRATE (PF) 100 MCG/2ML IJ SOLN: 25.0000 ug | INTRAMUSCULAR | Status: DC | PRN

## 2016-04-21 MED ORDER — PROPOFOL 10 MG/ML IV BOLUS
INTRAVENOUS | Status: DC | PRN
  Administered 2016-04-21 (×3): 20 mg via INTRAVENOUS
  Administered 2016-04-21: 10 mg via INTRAVENOUS

## 2016-04-21 MED ORDER — MEPERIDINE HCL 25 MG/ML IJ SOLN: 6.2500 mg | INTRAMUSCULAR | Status: DC | PRN

## 2016-04-21 MED ORDER — PROPOFOL 500 MG/50ML IV EMUL
INTRAVENOUS | Status: DC | PRN
  Administered 2016-04-21: 75 ug/kg/min via INTRAVENOUS

## 2016-04-21 MED ORDER — LACTATED RINGERS IV SOLN
INTRAVENOUS | Status: DC | PRN
  Administered 2016-04-21 (×2): via INTRAVENOUS

## 2016-04-21 NOTE — Op Note (Signed)
[facility] Patient Name: [name] [name] Procedure Date : 04/21/2016 MRN: [id number] Attending MD: Docia Chuck. Henrene Pastor , MD Date of Birth: 28-Nov-1951 CSN: [id number] Age: 65 Admit Type: Inpatient Procedure:                Colonoscopy Indications:              Heme positive stool, Iron deficiency anemia Providers:                Docia Chuck. Henrene Pastor, MD, Kingsley Plan, RN, Elspeth Cho, Technician Referring MD:              Medicines:                Monitored Anesthesia Care Complications:            No immediate complications. Estimated blood loss:                            None. Estimated Blood Loss:     Estimated blood loss: none. Procedure:                Pre-Anesthesia Assessment:                           - Prior to the procedure, a History and Physical                            was performed, and patient medications and                            allergies were reviewed. The patient's tolerance of                            previous anesthesia was also reviewed. The risks                            and benefits of the procedure and the sedation                            options and risks were discussed with the patient.                            All questions were answered, and informed consent                            was obtained. Prior Anticoagulants: The patient has                            taken no previous anticoagulant or antiplatelet                            agents. ASA Grade Assessment: III - A patient with  severe systemic disease. After reviewing the risks                            and benefits, the patient was deemed in                            satisfactory condition to undergo the procedure.                           After obtaining informed consent, the colonoscope                            was passed under direct vision. Throughout the                            procedure, the patient's blood  pressure, pulse, and                            oxygen saturations were monitored continuously. The                            [id number]) scope was introduced through                            the anus and advanced to the the cecum, identified                            by appendiceal orifice and ileocecal valve. The                            ileocecal valve, appendiceal orifice, and rectum                            were photographed. The quality of the bowel                            preparation was excellent. The colonoscopy was                            performed without difficulty. The patient tolerated                            the procedure well. The bowel preparation used was                            MoviPrep. Scope In: 12:09:40 PM Scope Out: 12:24:05 PM Scope Withdrawal Time: 0 hours 9 minutes 8 seconds  Total Procedure Duration: 0 hours 14 minutes 25 seconds  Findings:      A single medium-sized localized angiodysplastic lesion without bleeding       was found in the mid ascending colon.      The exam was otherwise without abnormality on direct and retroflexion       views. Impression:               -  A single non-bleeding colonic angiodysplastic                            lesion. This can cause intermittent                            Hemoccult-positive stool.                           - The examination was otherwise normal on direct                            and retroflexion views.                           - No specimens collected. Moderate Sedation:      none Recommendation:           - No further GI workup plan. GI will sign off.                           - Resume previous diet.                           - Continue present medications.                           - Consider multivitamin with iron supplement                           - No repeat colonoscopy due to age. Procedure Code(s):        --- Professional ---                           [phone],  Colonoscopy, flexible; diagnostic, including                            collection of specimen(s) by brushing or washing,                            when performed (separate procedure) Diagnosis Code(s):        --- Professional ---                           R19.5, Other fecal abnormalities                           D50.9, Iron deficiency anemia, unspecified CPT copyright 2016 American Medical Association. All rights reserved. The codes documented in this report are preliminary and upon coder review may  be revised to meet current compliance requirements. Docia Chuck. Henrene Pastor, MD 04/21/2016 12:37:46 PM This report has been signed electronically. Number of Addenda: 0

## 2016-04-21 NOTE — Transfer of Care (Signed)
Immediate Anesthesia Transfer of Care Note  Patient: [name] [name]  Procedure(s) Performed: Procedure(s): COLONOSCOPY WITH PROPOFOL (N/A)  Patient Location: Endoscopy Unit  Anesthesia Type:MAC  Level of Consciousness: sedated  Airway & Oxygen Therapy: Patient Spontanous Breathing and Patient connected to nasal cannula oxygen  Post-op Assessment: Report given to RN and Post -op Vital signs reviewed and stable  Post vital signs: Reviewed and stable  Last Vitals:  Filed Vitals:   04/21/16 0410 04/21/16 1048  BP: 136/59 150/70  Pulse: 72 74  Temp: 37.2 C   Resp:  9    Complications: No apparent anesthesia complications

## 2016-04-21 NOTE — H&P (View-Only) (Signed)
   EGD - small hiatal hernia  Colonoscopy - not attempted - not adequately prepped   Colonoscopy tomorrow 1130 Dr. Alvina Chou, MD, [facility] Gastroenterology [phone] (pager) [phone] after 5 PM, weekends and holidays  04/20/2016 10:54 AM

## 2016-04-21 NOTE — Interval H&P Note (Signed)
History and Physical Interval Note:  04/21/2016 11:40 AM  [name] [name]  has presented today for surgery, with the diagnosis of heme + stool, iron deficiency anemia  The various methods of treatment have been discussed with the patient and family. After consideration of risks, benefits and other options for treatment, the patient has consented to  Procedure(s): COLONOSCOPY WITH PROPOFOL (N/A) as a surgical intervention .  The patient's history has been reviewed, patient examined, no change in status, stable for surgery.  I have reviewed the patient's chart and labs.  Questions were answered to the patient's satisfaction.     [name]

## 2016-04-21 NOTE — Discharge Instructions (Signed)
Iron Deficiency Anemia, Adult Anemia is a condition in which there are less red blood cells or hemoglobin in the blood than normal. Hemoglobin is the part of red blood cells that carries oxygen. Iron deficiency anemia is anemia caused by too little iron. It is the most common type of anemia. It may leave you tired and short of breath. CAUSES   Lack of iron in the diet.  Poor absorption of iron, as seen with intestinal disorders.  Intestinal bleeding.  Heavy periods. SIGNS AND SYMPTOMS  Mild anemia may not be noticeable. Symptoms may include:  Fatigue.  Headache.  Pale skin.  Weakness.  Tiredness.  Shortness of breath.  Dizziness.  Cold hands and feet.  Fast or irregular heartbeat. DIAGNOSIS  Diagnosis requires a thorough evaluation and physical exam by your health care provider. Blood tests are generally used to confirm iron deficiency anemia. Additional tests may be done to find the underlying cause of your anemia. These may include:  Testing for blood in the stool (fecal occult blood test).  A procedure to see inside the colon and rectum (colonoscopy).  A procedure to see inside the esophagus and stomach (endoscopy). TREATMENT  Iron deficiency anemia is treated by correcting the cause of the deficiency. Treatment may involve:  Adding iron-rich foods to your diet.  Taking iron supplements. Pregnant or breastfeeding women need to take extra iron because their normal diet usually does not provide the required amount.  Taking vitamins. Vitamin C improves the absorption of iron. Your health care provider may recommend that you take your iron tablets with a glass of orange juice or vitamin C supplement.  Medicines to make heavy menstrual flow lighter.  Surgery. HOME CARE INSTRUCTIONS   Take iron as directed by your health care provider.  If you cannot tolerate taking iron supplements by mouth, talk to your health care provider about taking them through a vein  (intravenously) or an injection into a muscle.  For the best iron absorption, iron supplements should be taken on an empty stomach. If you cannot tolerate them on an empty stomach, you may need to take them with food.  Do not drink milk or take antacids at the same time as your iron supplements. Milk and antacids may interfere with the absorption of iron.  Iron supplements can cause constipation. Make sure to include fiber in your diet to prevent constipation. A stool softener may also be recommended.  Take vitamins as directed by your health care provider.  Eat a diet rich in iron. Foods high in iron include liver, lean beef, whole-grain bread, eggs, dried fruit, and dark green leafy vegetables. SEEK IMMEDIATE MEDICAL CARE IF:   You faint. If this happens, do not drive. Call your local emergency services (911 in [place] if no other help is available.  You have chest pain.  You feel nauseous or vomit.  You have severe or increased shortness of breath with activity.  You feel weak.  You have a rapid heartbeat.  You have unexplained sweating.  You become light-headed when getting up from a chair or bed. MAKE SURE YOU:   Understand these instructions.  Will watch your condition.  Will get help right away if you are not doing well or get worse.   This information is not intended to replace advice given to you by your health care provider. Make sure you discuss any questions you have with your health care provider.   Document Released: 12/12/2000 Document Revised: 01/05/2015 Document Reviewed: 08/22/2013 Elsevier   Interactive Patient Education 2016 Elsevier Inc.  

## 2016-04-21 NOTE — Anesthesia Postprocedure Evaluation (Signed)
Anesthesia Post Note  Patient: [name] [name]  Procedure(s) Performed: Procedure(s) (LRB): COLONOSCOPY WITH PROPOFOL (N/A)  Patient location during evaluation: Endoscopy Anesthesia Type: MAC Level of consciousness: awake and alert Pain management: pain level controlled Vital Signs Assessment: post-procedure vital signs reviewed and stable Respiratory status: spontaneous breathing, nonlabored ventilation, respiratory function stable and patient connected to nasal cannula oxygen Cardiovascular status: stable and blood pressure returned to baseline Anesthetic complications: no    Last Vitals:  Filed Vitals:   04/21/16 1245 04/21/16 1255  BP: 176/69 178/65  Pulse: 66 63  Temp:    Resp: 15 19    Last Pain:  Filed Vitals:   04/21/16 1255  PainSc: 0-No pain                 [name]

## 2016-04-21 NOTE — Discharge Summary (Signed)
[facility]  Physician Discharge Summary   Patient ID: [name] [name] MRN: [id number] DOB/AGE: 65-Aug-1952 65 y.o.  Admit date: 04/17/2016 Discharge date: 04/21/2016  PCP: Rogers Blocker, MD  DISCHARGE DIAGNOSES:  Principal Problem:   Chest pain Active Problems:   Essential hypertension   Type 2 diabetes mellitus with renal manifestations, controlled (HCC)   Chronic combined systolic and diastolic CHF (congestive heart failure) (HCC)   CKD (chronic kidney disease) stage 4, GFR 15-29 ml/min (HCC)   Acute on chronic renal failure (HCC)   Microcytic anemia   Thrombocytosis (HCC)   Absolute anemia   Bleeding gastrointestinal   Insulin dependent diabetes mellitus (HCC)   Pain in the chest   Demand ischemia (HCC)   Iron deficiency anemia   Precordial pain   Hiatal hernia   Heme + stool   RECOMMENDATIONS FOR OUTPATIENT FOLLOW UP: 1. Cardiology to arrange outpatient follow-up 2. Patient started on iron supplementation 3. Will need repeat CBC in 2 weeks 4. Patient will need follow-up and perhaps referral to urology for the right renal lesion   DISCHARGE CONDITION: fair  Diet recommendation: Modified carbohydrate  Filed Weights   04/20/16 0508 04/20/16 1014 04/21/16 0410  Weight: 125.873 kg (277 lb 8 oz) 125.646 kg (277 lb) 125.011 kg (275 lb 9.6 oz)    INITIAL HISTORY: 65 year old African-American female with a past medical history of coronary artery disease status post CABG, chronic kidney disease stage IV, chronic combined systolic and diastolic CHF, diabetes on insulin, history of cocaine abuse, presented with left-sided chest pain with diaphoresis and dyspnea. She was also found to be anemic. She was transfused 1 unit of blood. She was found to have Hemoccult positive stool. She was hospitalized for further management.  Consultants: Cardiology. Gastroenterology  Procedures: Blood transfusion  Transthoracic echocardiogram Study Conclusions - Left ventricle: The  cavity size was mildly dilated. Systolic function was mildly to moderately reduced. The estimated ejection fraction was in the range of 40% to 45%. There is severe hypokinesis of the entireinferoseptal myocardium. There is akinesis of the basal-midinferior myocardium. There is akinesis of the apicalanterior myocardium. There is hypokinesis of the midanterior myocardium. Features are consistent with a pseudonormal left ventricular filling pattern, with concomitant abnormal relaxation and increased filling pressure (grade 2 diastolic dysfunction). Doppler parameters are consistent with high ventricular filling pressure. - Mitral valve: Calcified annulus. - Left atrium: The atrium was mildly dilated. - Atrial septum: There was increased thickness of the septum, consistent with lipomatous hypertrophy.  EGD Impression: - Medium-sized hiatal hernia. - The examination was otherwise normal. - No specimens collected.  Colonoscopy Impression: - A single non-bleeding colonic angiodysplastic lesion. This can cause intermittent Hemoccult-positive stool. - The examination was otherwise normal on direct and retroflexion views. - No specimens collected.   HOSPITAL COURSE:    Chest pain in the setting of known CAD  Patient's symptoms appear to be musculoskeletal. She did have minimal elevation in troponin. Cardiology was consulted. 2-D echocardiogram was done. See report above. Systolic function appears to be similar to what it was back in January. Wall motion abnormalities noted. However, diffuse hypokinesis was also noted in January. No cocaine on urine drug screen. There could be a GI component to this chest pain as well. Patient had a cardiac catheterization last year in May, which revealed patency of the grafts. Continue statin. Outpatient follow-up with cardiology  Acute on chronic microcytic anemia/Iron deficiency  Hemoglobin has been trending down over the last few months. Patient denies any  overt  bleeding. She thinks she may have had a colonoscopy a few years ago, but none noted on our electronic medical records. She is not followed by gastroenterology on a regular basis. Her BUN was significantly elevated. She did have positive Hemoccult stool. This raised question of upper GI bleeding. She was tender in the epigastric area. She was placed on a PPI. Gastroenterology was consulted. EGD revealed a hiatal hernia and otherwise was unremarkable. Patient also underwent a colonoscopy which revealed a single nonbleeding colonic angiodysplastic lesion, which was thought to be the reason for her anemia. Patient has been transfused 2 units of blood. Hemoglobin has responded and remains stable. Anemia panel was done in January which revealed a ferritin of 26 and a TIBC of 342. B-12 level was normal. Folate was normal. Findings are suggestive of Iron deficiency. Will benefit from iron supplementation at discharge. No further GI workup is planned. Will need to repeat CBC in the outpatient setting in 2 weeks.  Epigastric abdominal pain Reason for her abdominal pain is unclear. No gastritis noted on upper endoscopy. LFTs are normal. Lipase was normal. Pain has improved. No clear indication to obtain imaging at this time. Patient to discuss this further with her PCP.  AKI on CKD stage IV/right renal lesion Serum creatinine is elevated but stable. Patient was gently hydrated. Avoid excessive IV fluids due to history of CHF. Ultrasound of kidneys were done. No hydronephrosis noted. Ultrasound revealed a hypoechoic area in the right kidney. MRI was recommended. However, due to CKD would not be able to give contrast. This will need outpatient follow-up. Patient is followed by nephrology at [facility]. She has an appointment early next month. PCP to consider referral to urology.  CAD s/p CABG Underwent CABG in July 2013 by Dr. Prescott Gum. Continue medications as discussed above. Urine drug screen is  negative for cocaine. Continue home medications. Stable.  Chronic combined systolic/diastolic CHF  TTE (99991111) with EF 123456, grade 2 diastolic dysfunction, mild MR. reasonably well compensated. Continue to monitor clinically. Lasix was on hold at the time of admission. This was resumed.   Type II DM  Managed with Lantus 25 units qHS at home. Patient had an episode of hypoglycemia on the morning of 4/21. This was likely because she was nothing by mouth. Lantus was held. Blood glucose levels noted to be elevated and so Lantus was resumed at a lower dose. Okay to resume her home medication regimen.  Essential Hypertension  Blood pressure is reasonably well controlled.  Leukocytosis, thrombocytosis  WBC and platelet counts are improved.  Overall improved. Patient will be monitored for a few more hours after her colonoscopy and then she can go home.   PERTINENT LABS:  The results of significant diagnostics from this hospitalization (including imaging, microbiology, ancillary and laboratory) are listed below for reference.    Microbiology: Recent Results (from the past 240 hour(s))  MRSA PCR Screening     Status: None   Collection Time: 04/17/16 10:57 PM  Result Value Ref Range Status   MRSA by PCR NEGATIVE NEGATIVE Final    Comment:        The GeneXpert MRSA Assay (FDA approved for NASAL specimens only), is one component of a comprehensive MRSA colonization surveillance program. It is not intended to diagnose MRSA infection nor to guide or monitor treatment for MRSA infections.   Culture, Urine     Status: None   Collection Time: 04/18/16  3:32 AM  Result Value Ref Range  Status   Specimen Description URINE, RANDOM  Final   Special Requests NONE  Final   Culture MULTIPLE SPECIES PRESENT, SUGGEST RECOLLECTION  Final   Report Status 04/19/2016 FINAL  Final     Labs: Basic Metabolic Panel:  Recent Labs Lab 04/17/16 1836 04/18/16 1124 04/19/16 0410 04/20/16 0545  04/21/16 0500  NA 138 143 141 141 143  K 4.2 4.0 4.5 4.7 4.0  CL 102 107 106 104 106  CO2 23 24 22 24 24   GLUCOSE 270* 131* 110* 103* 94  BUN 91* 80* 73* 63* 51*  CREATININE 2.69* 2.44* 2.52* 2.67* 2.66*  CALCIUM 8.8* 8.7* 8.6* 9.2 8.9   Liver Function Tests:  Recent Labs Lab 04/18/16 1124  AST 15  ALT 11*  ALKPHOS 104  BILITOT 0.4  PROT 6.7  ALBUMIN 2.8*    Recent Labs Lab 04/18/16 1124  LIPASE 31   CBC:  Recent Labs Lab 04/17/16 1836  04/18/16 1124 04/18/16 1820 04/19/16 0410 04/20/16 0545 04/21/16 0500  WBC 11.7*  --  10.5 9.5 8.8 9.6 7.6  NEUTROABS 7.9*  --   --   --   --   --   --   HGB 7.3*  < > 7.6* 8.2* 8.3* 8.9* 8.2*  HCT 25.1*  < > 26.0* 27.9* 27.7* 29.6* 27.6*  MCV 76.1*  --  76.9* 77.9* 77.8* 77.5* 77.7*  PLT 505*  --  459* 442* 415* 505* 431*  < > = values in this interval not displayed.  Cardiac Enzymes:  Recent Labs Lab 04/17/16 2327 04/18/16 0512 04/18/16 1124  TROPONINI 0.04* 0.07* 0.06*   CBG:  Recent Labs Lab 04/20/16 0740 04/20/16 1125 04/20/16 1621 04/20/16 2059 04/21/16 0735  GLUCAP 106* 101* 175* 125* 12     IMAGING STUDIES Dg Chest 2 View  04/17/2016  CLINICAL DATA:  LEFT chest pain after eating today, more pain radiating to central chest, diaphoresis, diabetes mellitus, CHF, coronary artery disease, prior stroke, chronic kidney disease, hypertension EXAM: CHEST  2 VIEW COMPARISON:  01/13/2016 FINDINGS: Minimal enlargement of cardiac silhouette post CABG. Tortuous aorta. Mediastinal contours and pulmonary vascularity otherwise normal. Lungs clear. No infiltrate, pleural effusion, or pneumothorax. Bones demineralized. IMPRESSION: Minimal enlargement of cardiac silhouette post CABG. No acute abnormalities. Electronically Signed   By: Lavonia Dana M.D.   On: 04/17/2016 18:25   US Renal  04/18/2016  CLINICAL DATA:  Acute renal failure. EXAM: RENAL / URINARY TRACT ULTRASOUND COMPLETE COMPARISON:  None. FINDINGS: Right Kidney:  Length: 11.6 cm. Renal cortical echogenicity is within normal limits. Sonographer measures a 2.0 x 1.6 x 2.2 cm hypoechoic lesion in the interpolar right kidney without substantial enhanced through transmission. No hydronephrosis right kidney. Left Kidney: Length: 11.3 cm. Echogenicity within normal limits. No mass or hydronephrosis visualized. Bladder: Appears normal for degree of bladder distention. IMPRESSION: 1. No evidence for hydronephrosis. 2. 2.2 cm hypoechoic area in the interpolar region of the right kidney may represent a prominent column of Bertin, but renal neoplasm not completely excluded. MRI of the abdomen without and with contrast recommended to further evaluate. These results will be called to the ordering clinician or representative by the Radiologist Assistant, and communication documented in the PACS or zVision Dashboard. Electronically Signed   By: Misty Stanley M.D.   On: 04/18/2016 08:35    DISCHARGE EXAMINATION: Filed Vitals:   04/21/16 1233 04/21/16 1235 04/21/16 1245 04/21/16 1255  BP: 154/73 154/73 176/69 178/65  Pulse: 65 65 66 63  Temp:  TempSrc:      Resp: 17 16 15 19   Height:      Weight:      SpO2: 100% 100% 100% 100%   General appearance: alert, cooperative, appears stated age and no distress Resp: clear to auscultation bilaterally Cardio: regular rate and rhythm, S1, S2 normal, no murmur, click, rub or gallop GI: soft, non-tender; bowel sounds normal; no masses,  no organomegaly Extremities: extremities normal, atraumatic, no cyanosis or edema  DISPOSITION: Home  Discharge Instructions    Call MD for:  difficulty breathing, headache or visual disturbances    Complete by:  As directed      Call MD for:  extreme fatigue    Complete by:  As directed      Call MD for:  persistant dizziness or light-headedness    Complete by:  As directed      Call MD for:  persistant nausea and vomiting    Complete by:  As directed      Call MD for:  redness,  tenderness, or signs of infection (pain, swelling, redness, odor or green/yellow discharge around incision site)    Complete by:  As directed      Call MD for:  severe uncontrolled pain    Complete by:  As directed      Call MD for:  temperature >100.4    Complete by:  As directed      Diet - low sodium heart healthy    Complete by:  As directed      Diet Carb Modified    Complete by:  As directed      Discharge instructions    Complete by:  As directed   The colonoscopy showed a small blood vessel which could be the reason for your anemia. But it did not show any polyps or active bleeding. Cardiology to arrange outpatient follow-up. You need iron supplementation. Prescription has been written for same. You will need to have repeat blood work (CBC) in 2 weeks. You were found to have a spot in your right kidney which could be normal kidney tissue. Please talk to your PCP about this.   You were cared for by a hospitalist during your hospital stay. If you have any questions about your discharge medications or the care you received while you were in the hospital after you are discharged, you can call the unit and asked to speak with the hospitalist on call if the hospitalist that took care of you is not available. Once you are discharged, your primary care physician will handle any further medical issues. Please note that NO REFILLS for any discharge medications will be authorized once you are discharged, as it is imperative that you return to your primary care physician (or establish a relationship with a primary care physician if you do not have one) for your aftercare needs so that they can reassess your need for medications and monitor your lab values. If you do not have a primary care physician, you can call [phone] for a physician referral.     Increase activity slowly    Complete by:  As directed            ALLERGIES:  Allergies  Allergen Reactions  . Naproxen Rash     Current  Discharge Medication List    START taking these medications   Details  ferrous sulfate 325 (65 FE) MG tablet Take 1 tablet (325 mg total) by mouth 2 (two) times daily with a meal.  Qty: 60 tablet, Refills: 3      CONTINUE these medications which have NOT CHANGED   Details  aspirin EC 81 MG EC tablet Take 1 tablet (81 mg total) by mouth daily. Qty: 30 tablet, Refills: 0    brinzolamide (AZOPT) 1 % ophthalmic suspension Place 1 drop into both eyes 3 (three) times daily.    buPROPion (WELLBUTRIN SR) 150 MG 12 hr tablet Take 1 tablet (150 mg total) by mouth 2 (two) times daily. Qty: 60 tablet, Refills: 0    cetirizine (ZYRTEC) 10 MG tablet Take 10 mg by mouth daily as needed for allergies.     Fluticasone-Salmeterol (ADVAIR) 250-50 MCG/DOSE AEPB Inhale 1 puff into the lungs every 12 (twelve) hours.    furosemide (LASIX) 40 MG tablet Take 1 tablet (40 mg total) by mouth daily. Take additional dose if you experience weight gain. Qty: 45 tablet, Refills: 0    gabapentin (NEURONTIN) 300 MG capsule 300 mg at bedtime.    Insulin Glargine (LANTUS SOLOSTAR) 100 UNIT/ML Solostar Pen Inject 25 Units into the skin daily at 10 pm. Qty: 15 mL, Refills: 0    latanoprost (XALATAN) 0.005 % ophthalmic solution Place 1 drop into both eyes at bedtime.    metoprolol tartrate (LOPRESSOR) 25 MG tablet Take 0.5 tablets (12.5 mg total) by mouth 2 (two) times daily. Qty: 30 tablet, Refills: 0    pravastatin (PRAVACHOL) 40 MG tablet Take 40 mg by mouth at bedtime.    traMADol (ULTRAM) 50 MG tablet Take 50 mg by mouth 3 (three) times daily.       STOP taking these medications     glimepiride (AMARYL) 4 MG tablet        Follow-up Information    Follow up with Rogers Blocker, MD. Schedule an appointment as soon as possible for a visit in 1 week.   Specialty:  Internal Medicine   Why:  post hospitalization follow up. To discudd the spot in the right kidney.   Contact information:   [address] [place] [address] [phone]       Follow up with Erlene Quan, PA-C On 04/29/2016.   Specialties:  Cardiology, Radiology   Why:  at 3:00PM  Dr. Doug Sou PA.   Contact information:   [address] [place] [address] [phone]       TOTAL DISCHARGE TIME: 35 mins  [facility] Pager [phone]  04/21/2016, 4:19 PM

## 2016-04-21 NOTE — Care Management Obs Status (Signed)
[facility] NOTIFICATION   Patient Details  Name: DENEE LUNDVALL MRN: [id number] Date of Birth: 06-04-1951   Medicare Observation Status Notification Given:  Yes    Bethena Roys, RN 04/21/2016, 9:42 AM

## 2016-04-21 NOTE — Anesthesia Preprocedure Evaluation (Addendum)
Anesthesia Evaluation  Patient identified by MRN, date of birth, ID band Patient awake    Reviewed: Allergy & Precautions, H&P , NPO status , Patient's Chart, lab work & pertinent test results, reviewed documented beta blocker date and time   History of Anesthesia Complications Negative for: history of anesthetic complications  Airway Mallampati: II  TM Distance: >3 FB Neck ROM: Full    Dental  (+) Edentulous Lower, Edentulous Upper   Pulmonary Current Smoker,    Pulmonary exam normal breath sounds clear to auscultation       Cardiovascular hypertension, Pt. on medications and Pt. on home beta blockers + angina + CAD, + Past MI and + CABG  Normal cardiovascular exam Rhythm:Regular Rate:Normal  EF 55%   Neuro/Psych CVA, Residual Symptoms negative psych ROS   GI/Hepatic GERD  Controlled,(+)     substance abuse  cocaine use,   Endo/Other  diabetes, Well Controlled, Type 2, Oral Hypoglycemic AgentsMorbid obesity  Renal/GU Renal disease     Musculoskeletal   Abdominal (+) + obese,   Peds  Hematology   Anesthesia Other Findings   Reproductive/Obstetrics                            Anesthesia Physical  Anesthesia Plan  ASA: III  Anesthesia Plan: MAC   Post-op Pain Management:    Induction: Intravenous  Airway Management Planned: Simple Face Mask  Additional Equipment:   Intra-op Plan:   Post-operative Plan:   Informed Consent: I have reviewed the patients History and Physical, chart, labs and discussed the procedure including the risks, benefits and alternatives for the proposed anesthesia with the patient or authorized representative who has indicated his/her understanding and acceptance.   Dental advisory given  Plan Discussed with: Anesthesiologist, Surgeon and CRNA  Anesthesia Plan Comments: (Plan routine monitors, GETA)        Anesthesia Quick Evaluation

## 2016-04-23 ENCOUNTER — Encounter (HOSPITAL_COMMUNITY): Payer: Self-pay | Admitting: Internal Medicine

## 2016-04-29 ENCOUNTER — Ambulatory Visit (INDEPENDENT_AMBULATORY_CARE_PROVIDER_SITE_OTHER): Payer: Medicare Other | Admitting: Cardiology

## 2016-04-29 ENCOUNTER — Encounter: Payer: Self-pay | Admitting: Cardiology

## 2016-04-29 VITALS — BP 140/75 | HR 82 | Ht 68.5 in | Wt 279.2 lb

## 2016-04-29 DIAGNOSIS — N184 Chronic kidney disease, stage 4 (severe): Secondary | ICD-10-CM | POA: Diagnosis not present

## 2016-04-29 DIAGNOSIS — Z951 Presence of aortocoronary bypass graft: Secondary | ICD-10-CM

## 2016-04-29 DIAGNOSIS — E785 Hyperlipidemia, unspecified: Secondary | ICD-10-CM

## 2016-04-29 DIAGNOSIS — N189 Chronic kidney disease, unspecified: Secondary | ICD-10-CM | POA: Diagnosis not present

## 2016-04-29 DIAGNOSIS — F1411 Cocaine abuse, in remission: Secondary | ICD-10-CM

## 2016-04-29 DIAGNOSIS — F1410 Cocaine abuse, uncomplicated: Secondary | ICD-10-CM

## 2016-04-29 DIAGNOSIS — D649 Anemia, unspecified: Secondary | ICD-10-CM | POA: Diagnosis not present

## 2016-04-29 DIAGNOSIS — I10 Essential (primary) hypertension: Secondary | ICD-10-CM

## 2016-04-29 DIAGNOSIS — Z8673 Personal history of transient ischemic attack (TIA), and cerebral infarction without residual deficits: Secondary | ICD-10-CM

## 2016-04-29 DIAGNOSIS — I5042 Chronic combined systolic (congestive) and diastolic (congestive) heart failure: Secondary | ICD-10-CM

## 2016-04-29 DIAGNOSIS — E1121 Type 2 diabetes mellitus with diabetic nephropathy: Secondary | ICD-10-CM

## 2016-04-29 DIAGNOSIS — E669 Obesity, unspecified: Secondary | ICD-10-CM

## 2016-04-29 DIAGNOSIS — Z794 Long term (current) use of insulin: Secondary | ICD-10-CM

## 2016-04-29 DIAGNOSIS — Z720 Tobacco use: Secondary | ICD-10-CM

## 2016-04-29 NOTE — Assessment & Plan Note (Signed)
She continues to smoke a couple of cigarettes a day

## 2016-04-29 NOTE — Assessment & Plan Note (Signed)
EF 35-40% by echo Jan 2017, 40-45% April 2017

## 2016-04-29 NOTE — Assessment & Plan Note (Signed)
Stable, she has an appointment with [facility] at the end of the month

## 2016-04-29 NOTE — Assessment & Plan Note (Signed)
Right internal capsule stroke in 12/2006.

## 2016-04-29 NOTE — Patient Instructions (Signed)
Medication Instructions: Your physician recommends that you continue on your current medications as directed. Please refer to the Current Medication list given to you today.   Labwork: Your physician recommends that you return for lab work TODAY. The lab can be found on the FIRST FLOOR of out building in [place]: Your physician wants you to follow-up in: 6 months with Dr Martinique. You will receive a reminder letter in the mail two months in advance. If you don't receive a letter, please call our office to schedule the follow-up appointment.   Any Other Special Instructions Will Be Listed Below (If Applicable).     If you need a refill on your cardiac medications before your next appointment, please call your pharmacy.

## 2016-04-29 NOTE — Assessment & Plan Note (Signed)
On statin Rx 

## 2016-04-29 NOTE — Assessment & Plan Note (Signed)
BMI 41 

## 2016-04-29 NOTE — Assessment & Plan Note (Signed)
Type 2 IDDM 

## 2016-04-29 NOTE — Progress Notes (Signed)
04/29/2016 [name] [name]   1951/12/16  [id number]  Primary Physician Rogers Blocker, MD Primary Cardiologist: Dr Martinique  HPI:  65 y/o obese AA female with a history of CAD, s/p CABG x 3 July 0000000 complicated by sternal wound infection requiring debridement in Aug 2013. The pt had a cath in May 2016 that showed patent grafts. Her EF then was 40-45%. She has multiple other medical problems. She has IDDM, CRI stage 4, morbid obesity with a BMI of 41, a h/o CVA 2008 with chronic LLE weakness, and has struggled with cigarette smoking and cocaine use. She was just admitted with chest pain 04/18/16. An echo showed no change in her EF- 40-45% (was 35-40% in Jan in setting of cocaine use). Her UDS was negative this past admission. She was found to be significantly anemic-Hgb 7.3. She was transfused and seen by GI. The pt tells me her chest pain went away after she had a large bowel movement during her GI prep. GI work showed one non bleeding AVM and iron therapy was recommended. She is in the office today for follow up. She denies any chest pain. She is taking her medications. She is still smoking a couple of cigarettes a day.    Current Outpatient Prescriptions  Medication Sig Dispense Refill  . aspirin EC 81 MG EC tablet Take 1 tablet (81 mg total) by mouth daily. 30 tablet 0  . brinzolamide (AZOPT) 1 % ophthalmic suspension Place 1 drop into both eyes 3 (three) times daily.    Marland Kitchen buPROPion (WELLBUTRIN SR) 150 MG 12 hr tablet Take 1 tablet (150 mg total) by mouth 2 (two) times daily. 60 tablet 0  . cetirizine (ZYRTEC) 10 MG tablet Take 10 mg by mouth daily as needed for allergies.     . ferrous sulfate 325 (65 FE) MG tablet Take 1 tablet (325 mg total) by mouth 2 (two) times daily with a meal. 60 tablet 3  . Fluticasone-Salmeterol (ADVAIR) 250-50 MCG/DOSE AEPB Inhale 1 puff into the lungs every 12 (twelve) hours.    . furosemide (LASIX) 40 MG tablet Take 1 tablet (40 mg total) by mouth daily. Take additional  dose if you experience weight gain. 45 tablet 0  . gabapentin (NEURONTIN) 300 MG capsule 300 mg at bedtime.    . Insulin Glargine (LANTUS SOLOSTAR) 100 UNIT/ML Solostar Pen Inject 25 Units into the skin daily at 10 pm. 15 mL 0  . latanoprost (XALATAN) 0.005 % ophthalmic solution Place 1 drop into both eyes at bedtime.    . metoprolol tartrate (LOPRESSOR) 25 MG tablet Take 0.5 tablets (12.5 mg total) by mouth 2 (two) times daily. 30 tablet 0  . pravastatin (PRAVACHOL) 40 MG tablet Take 40 mg by mouth at bedtime.    . traMADol (ULTRAM) 50 MG tablet Take 50 mg by mouth 3 (three) times daily.     Marland Kitchen glimepiride (AMARYL) 4 MG tablet Take 4 mg by mouth daily.     Current Facility-Administered Medications  Medication Dose Route Frequency Provider Last Rate Last Dose  . loratadine (CLARITIN) tablet 10 mg  10 mg Oral Daily Ivin Poot, MD        Allergies  Allergen Reactions  . Naproxen Rash    Social History   Social History  . Marital Status: Single    Spouse Name: N/A  . Number of Children: N/A  . Years of Education: N/A   Occupational History  . Not on file.   Social  History Main Topics  . Smoking status: Current Some Day Smoker -- 0.25 packs/day  . Smokeless tobacco: Not on file  . Alcohol Use: No  . Drug Use: Yes    Special: Cocaine  . Sexual Activity: No   Other Topics Concern  . Not on file   Social History Narrative   Lives with boyfriend of 20 years.      Review of Systems: General: negative for chills, fever, night sweats or weight changes.  Cardiovascular: negative for chest pain, dyspnea on exertion, edema, orthopnea, palpitations, paroxysmal nocturnal dyspnea or shortness of breath Dermatological: negative for rash Respiratory: negative for cough or wheezing Urologic: negative for hematuria Abdominal: negative for nausea, vomiting, diarrhea, bright red blood per rectum, melena, or hematemesis Neurologic: negative for visual changes, syncope, or  dizziness All other systems reviewed and are otherwise negative except as noted above.    Blood pressure 140/75, pulse 82, height 5' 8.5" (1.74 m), weight 279 lb 3.2 oz (126.644 kg).  General appearance: alert, cooperative, no distress and morbidly obese Lungs: clear to auscultation bilaterally Heart: regular rate and rhythm Extremities: no edema Skin: Skin color, texture, turgor normal. No rashes or lesions Neurologic: Grossly normal   ASSESSMENT AND PLAN:   S/P CABG (coronary artery bypass graft) CABG x 3 with LIMA-LAD, SVG-OM1, SVG-CFX complicated by sternal wound infection requiring debridement Aug 2013 Cath May 2016 patent grafts Recent admission April 2017 for chest pain which was most likely GI and possibly secondary to anemia  Absolute anemia GI w/u with endo and colon April 2017- Dr Henrene Pastor- one AVM otherwise unremarkable, iron added  CKD (chronic kidney disease) stage 4, GFR 15-29 ml/min ([place]) Stable, she has an appointment with [facility] at the end of the month  Chronic combined systolic and diastolic CHF (congestive heart failure) ([place]) EF 35-40% by echo Jan 2017, 40-45% April 2017  Essential hypertension Controlled  History of CVA (cerebrovascular accident) Right internal capsule stroke in 12/2006.  Hyperlipidemia On statin Rx  History of cocaine abuse UDS negative April 2017  Tobacco abuse She continues to smoke a couple of cigarettes a day  Type 2 diabetes mellitus with renal manifestations, controlled ([place]) Type 2 IDDM  Obesity BMI 41    PLAN  Same Rx- we discussed smoking cessation. F/U Dr Martinique in 6  months  Ilean China 04/29/2066 4:56 PM

## 2016-04-29 NOTE — Assessment & Plan Note (Signed)
GI w/u with endo and colon April 2017- Dr Henrene Pastor- one AVM otherwise unremarkable, iron added

## 2016-04-29 NOTE — Assessment & Plan Note (Signed)
Controlled.  

## 2016-04-29 NOTE — Assessment & Plan Note (Addendum)
CABG x 3 with LIMA-LAD, SVG-OM1, SVG-CFX complicated by sternal wound infection requiring debridement Aug 2013 Cath May 2016 patent grafts Recent admission April 2017 for chest pain which was most likely GI and possibly secondary to anemia

## 2016-04-29 NOTE — Assessment & Plan Note (Signed)
UDS negative April 2017

## 2016-04-30 LAB — CBC WITH DIFFERENTIAL/PLATELET
Basophils Absolute: 0 cells/uL (ref 0–200)
Basophils Relative: 0 %
Eosinophils Absolute: 216 cells/uL (ref 15–500)
Eosinophils Relative: 2 %
HCT: 31.7 % — ABNORMAL LOW (ref 35.0–45.0)
Hemoglobin: 9.6 g/dL — ABNORMAL LOW (ref 11.7–15.5)
Lymphocytes Relative: 17 %
Lymphs Abs: 1836 cells/uL (ref 850–3900)
MCH: 23.6 pg — ABNORMAL LOW (ref 27.0–33.0)
MCHC: 30.3 g/dL — ABNORMAL LOW (ref 32.0–36.0)
MCV: 77.9 fL — ABNORMAL LOW (ref 80.0–100.0)
MPV: 8.4 fL (ref 7.5–12.5)
Monocytes Absolute: 972 cells/uL — ABNORMAL HIGH (ref 200–950)
Monocytes Relative: 9 %
Neutro Abs: 7776 cells/uL (ref 1500–7800)
Neutrophils Relative %: 72 %
Platelets: 515 10*3/uL — ABNORMAL HIGH (ref 140–400)
RBC: 4.07 MIL/uL (ref 3.80–5.10)
RDW: 20 % — ABNORMAL HIGH (ref 11.0–15.0)
WBC: 10.8 10*3/uL (ref 3.8–10.8)

## 2016-04-30 LAB — BASIC METABOLIC PANEL
BUN: 75 mg/dL — ABNORMAL HIGH (ref 7–25)
CO2: 26 mmol/L (ref 20–31)
Calcium: 8.9 mg/dL (ref 8.6–10.4)
Chloride: 102 mmol/L (ref 98–110)
Creat: 2.62 mg/dL — ABNORMAL HIGH (ref 0.50–0.99)
Glucose, Bld: 98 mg/dL (ref 65–99)
Potassium: 3.9 mmol/L (ref 3.5–5.3)
Sodium: 139 mmol/L (ref 135–146)

## 2016-05-09 DIAGNOSIS — N184 Chronic kidney disease, stage 4 (severe): Secondary | ICD-10-CM | POA: Diagnosis not present

## 2016-05-09 DIAGNOSIS — I10 Essential (primary) hypertension: Secondary | ICD-10-CM | POA: Diagnosis not present

## 2016-05-09 DIAGNOSIS — D631 Anemia in chronic kidney disease: Secondary | ICD-10-CM | POA: Diagnosis not present

## 2016-05-09 DIAGNOSIS — N2889 Other specified disorders of kidney and ureter: Secondary | ICD-10-CM | POA: Diagnosis not present

## 2016-05-19 ENCOUNTER — Other Ambulatory Visit: Payer: Self-pay | Admitting: Nephrology

## 2016-05-19 DIAGNOSIS — N2889 Other specified disorders of kidney and ureter: Secondary | ICD-10-CM

## 2016-05-24 ENCOUNTER — Other Ambulatory Visit: Payer: Medicare Other

## 2016-06-17 ENCOUNTER — Inpatient Hospital Stay: Admission: RE | Admit: 2016-06-17 | Payer: Medicare Other | Source: Ambulatory Visit

## 2016-07-03 ENCOUNTER — Other Ambulatory Visit: Payer: Medicare Other

## 2016-07-06 IMAGING — US US RENAL
1 series · 14 of 24 positions shown · non-contrast
Comparison: None.

CLINICAL DATA: Acute renal failure.

EXAM:
RENAL / URINARY TRACT ULTRASOUND COMPLETE

[Series 1: us renal · 0.28mm/px · 14 of 24 slices shown]
[im 1/24]
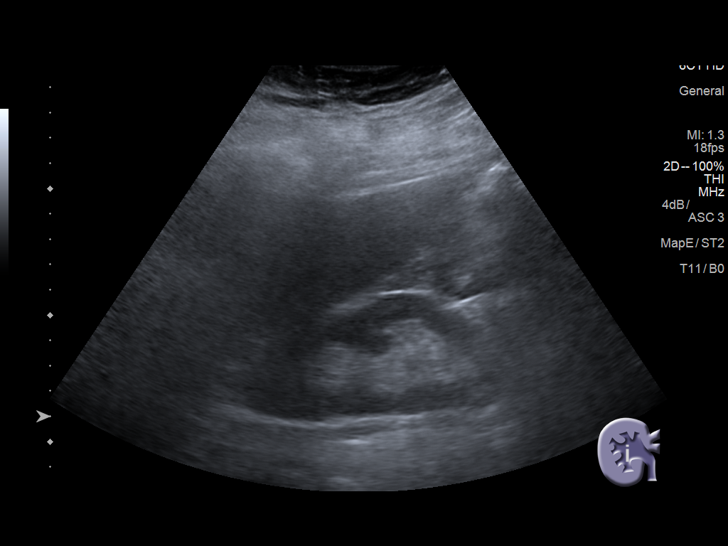
[im 3/24]
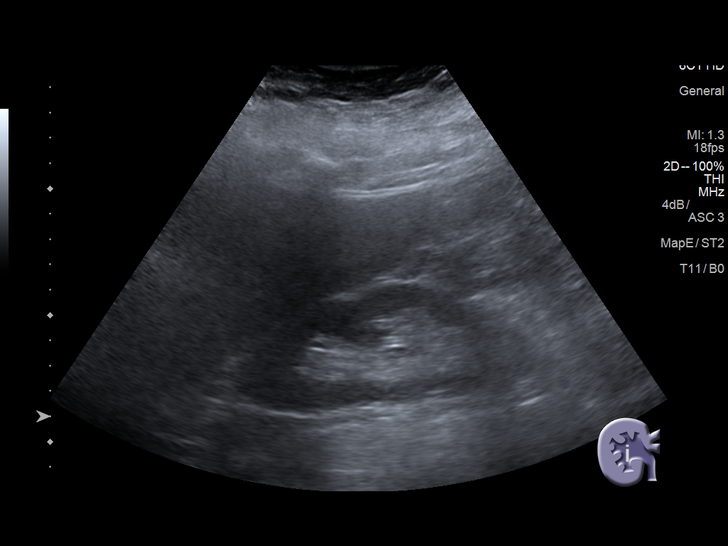
[im 5/24]
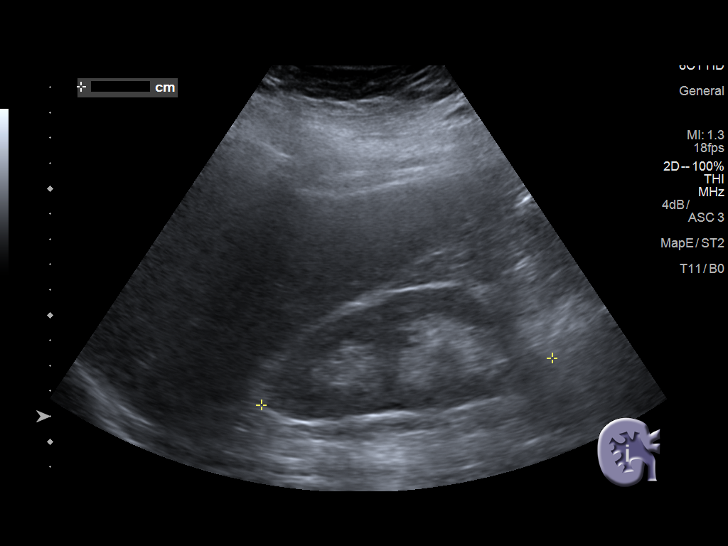
[im 7/24]
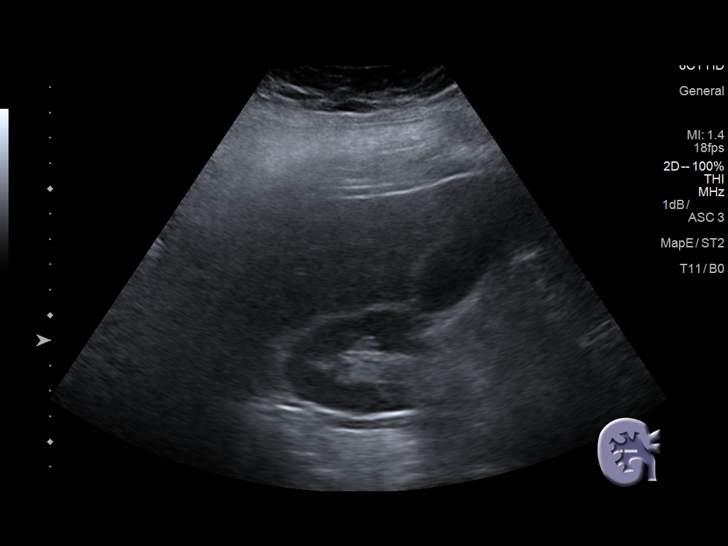
[im 8/24]
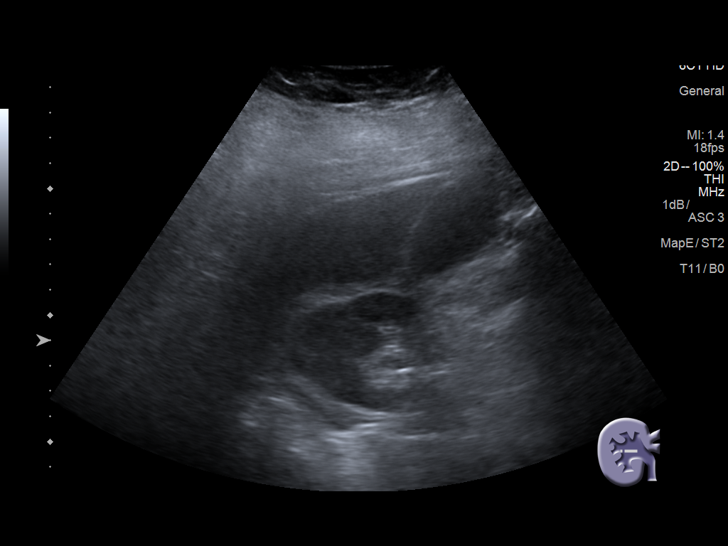
[im 10/24]
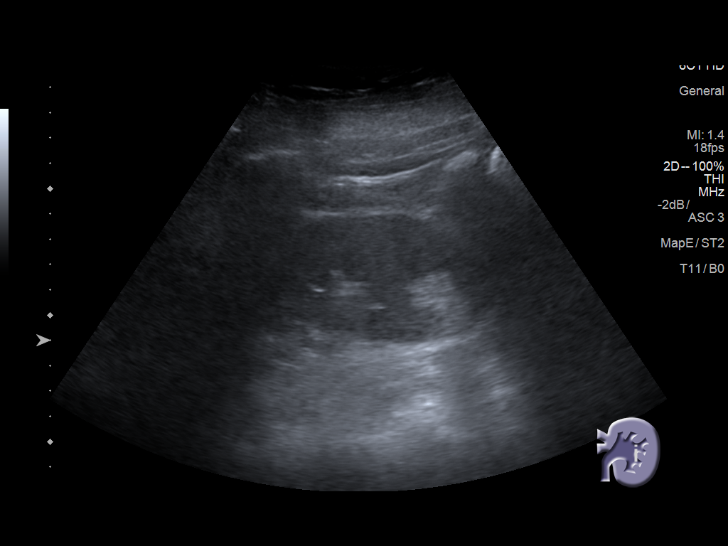
[im 12/24]
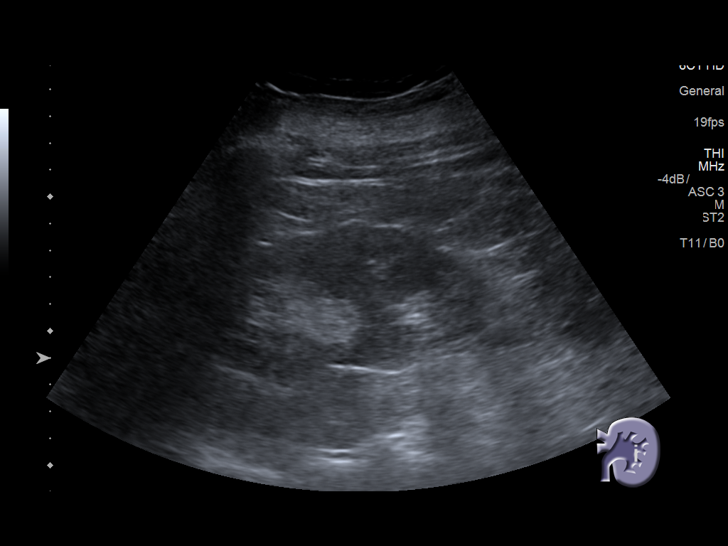
[im 13/24]
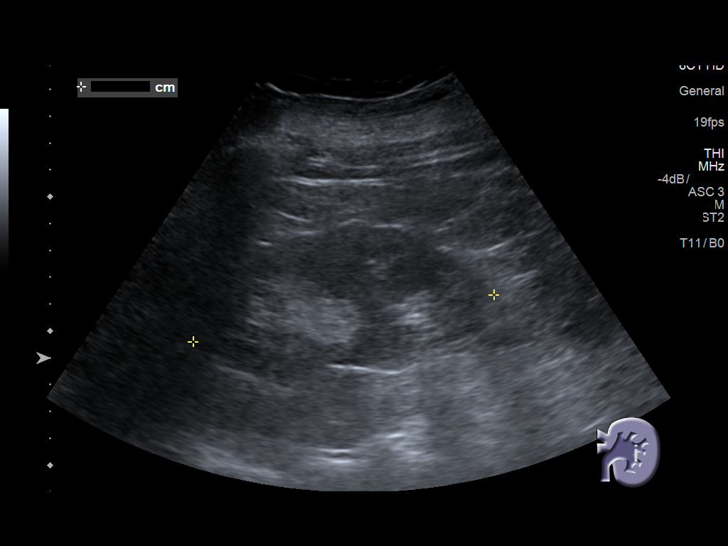
[im 15/24]
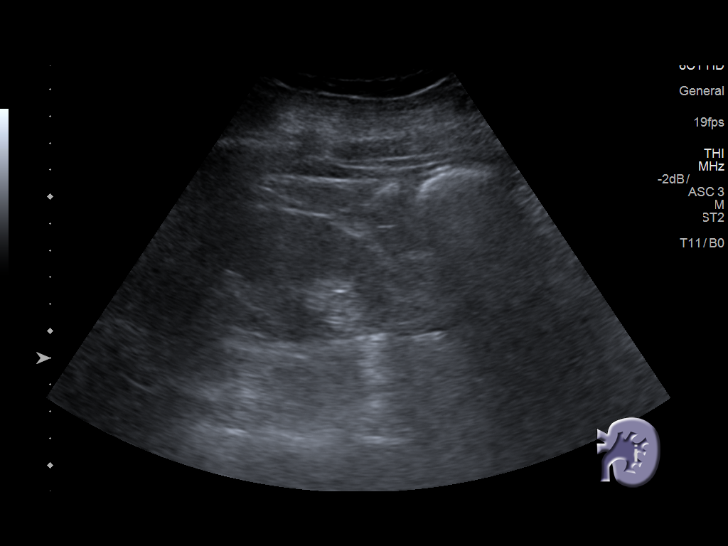
[im 17/24]
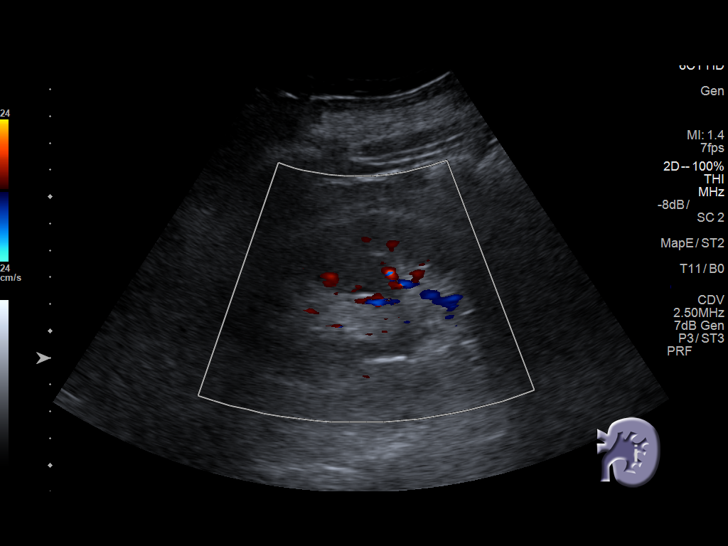
[im 19/24]
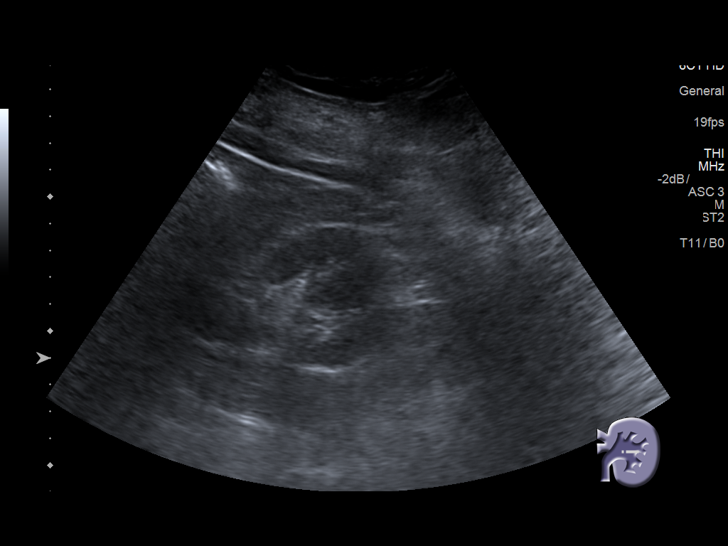
[im 20/24]
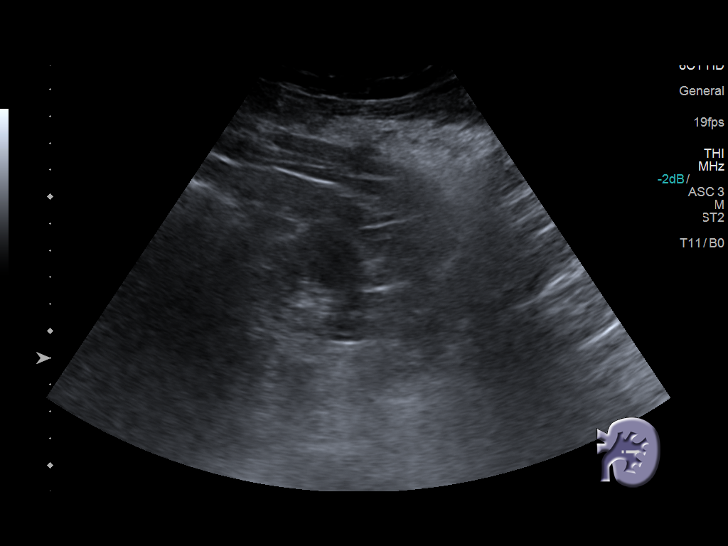
[im 22/24]
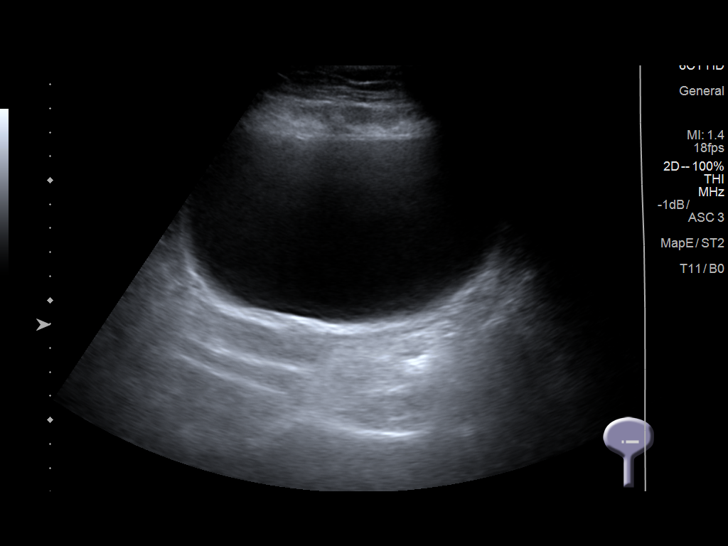
[im 24/24]
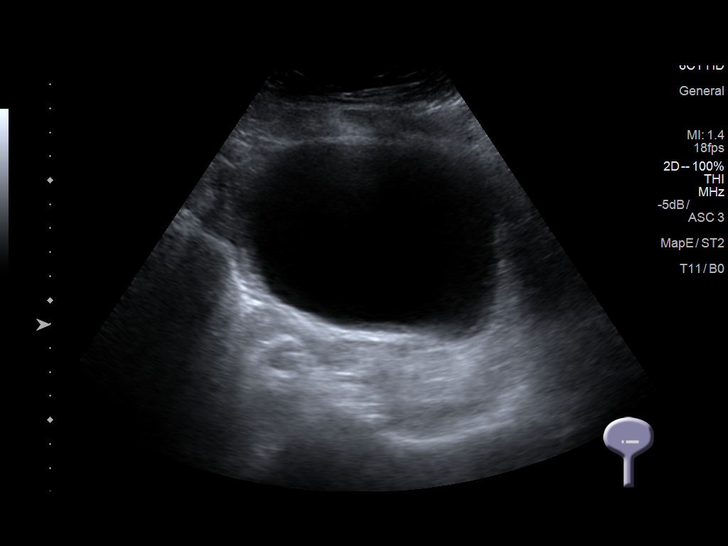

[14 of 24 positions shown; findings below may reference images not displayed]

FINDINGS: Right Kidney:

Length: 11.6 cm. Renal cortical echogenicity is within normal
limits. Sonographer measures a 2.0 x 1.6 x 2.2 cm hypoechoic lesion
in the interpolar right kidney without substantial enhanced through
transmission. No hydronephrosis right kidney.

Left Kidney:

Length: 11.3 cm. Echogenicity within normal limits. No mass or
hydronephrosis visualized.

Bladder:

Appears normal for degree of bladder distention.
IMPRESSION: 1. No evidence for hydronephrosis.
2. 2.2 cm hypoechoic area in the interpolar region of the right
kidney may represent a prominent column of Bertin, but renal
neoplasm not completely excluded. MRI of the abdomen without and
with contrast recommended to further evaluate.
These results will be called to the ordering clinician or
representative by the Radiologist Assistant, and communication
documented in the PACS or zVision Dashboard.

## 2016-08-29 DIAGNOSIS — D649 Anemia, unspecified: Secondary | ICD-10-CM

## 2016-08-29 HISTORY — DX: Anemia, unspecified: D64.9

## 2016-09-16 ENCOUNTER — Inpatient Hospital Stay (HOSPITAL_COMMUNITY)
Admission: EM | Admit: 2016-09-16 | Discharge: 2016-09-23 | DRG: 291 | Disposition: A | Discharge status: Home / Self Care | Payer: Medicare Other | Attending: Internal Medicine | Admitting: Internal Medicine

## 2016-09-16 ENCOUNTER — Emergency Department (HOSPITAL_COMMUNITY): Payer: Medicare Other

## 2016-09-16 ENCOUNTER — Encounter (HOSPITAL_COMMUNITY): Payer: Self-pay

## 2016-09-16 DIAGNOSIS — N184 Chronic kidney disease, stage 4 (severe): Secondary | ICD-10-CM

## 2016-09-16 DIAGNOSIS — Z886 Allergy status to analgesic agent status: Secondary | ICD-10-CM

## 2016-09-16 DIAGNOSIS — Z7982 Long term (current) use of aspirin: Secondary | ICD-10-CM

## 2016-09-16 DIAGNOSIS — Z23 Encounter for immunization: Secondary | ICD-10-CM

## 2016-09-16 DIAGNOSIS — I2510 Atherosclerotic heart disease of native coronary artery without angina pectoris: Secondary | ICD-10-CM | POA: Diagnosis present

## 2016-09-16 DIAGNOSIS — H409 Unspecified glaucoma: Secondary | ICD-10-CM | POA: Diagnosis not present

## 2016-09-16 DIAGNOSIS — I5043 Acute on chronic combined systolic (congestive) and diastolic (congestive) heart failure: Secondary | ICD-10-CM | POA: Diagnosis present

## 2016-09-16 DIAGNOSIS — F418 Other specified anxiety disorders: Secondary | ICD-10-CM | POA: Diagnosis present

## 2016-09-16 DIAGNOSIS — E1151 Type 2 diabetes mellitus with diabetic peripheral angiopathy without gangrene: Secondary | ICD-10-CM | POA: Diagnosis present

## 2016-09-16 DIAGNOSIS — Z79899 Other long term (current) drug therapy: Secondary | ICD-10-CM

## 2016-09-16 DIAGNOSIS — I130 Hypertensive heart and chronic kidney disease with heart failure and stage 1 through stage 4 chronic kidney disease, or unspecified chronic kidney disease: Secondary | ICD-10-CM | POA: Diagnosis not present

## 2016-09-16 DIAGNOSIS — D509 Iron deficiency anemia, unspecified: Secondary | ICD-10-CM | POA: Diagnosis present

## 2016-09-16 DIAGNOSIS — I10 Essential (primary) hypertension: Secondary | ICD-10-CM | POA: Diagnosis present

## 2016-09-16 DIAGNOSIS — Z6841 Body Mass Index (BMI) 40.0 and over, adult: Secondary | ICD-10-CM

## 2016-09-16 DIAGNOSIS — R0602 Shortness of breath: Secondary | ICD-10-CM

## 2016-09-16 DIAGNOSIS — E876 Hypokalemia: Secondary | ICD-10-CM

## 2016-09-16 DIAGNOSIS — E118 Type 2 diabetes mellitus with unspecified complications: Secondary | ICD-10-CM | POA: Diagnosis not present

## 2016-09-16 DIAGNOSIS — Z951 Presence of aortocoronary bypass graft: Secondary | ICD-10-CM

## 2016-09-16 DIAGNOSIS — I69349 Monoplegia of lower limb following cerebral infarction affecting unspecified side: Secondary | ICD-10-CM

## 2016-09-16 DIAGNOSIS — Z794 Long term (current) use of insulin: Secondary | ICD-10-CM

## 2016-09-16 DIAGNOSIS — E1129 Type 2 diabetes mellitus with other diabetic kidney complication: Secondary | ICD-10-CM

## 2016-09-16 DIAGNOSIS — E669 Obesity, unspecified: Secondary | ICD-10-CM

## 2016-09-16 DIAGNOSIS — M549 Dorsalgia, unspecified: Secondary | ICD-10-CM

## 2016-09-16 DIAGNOSIS — D649 Anemia, unspecified: Secondary | ICD-10-CM

## 2016-09-16 DIAGNOSIS — N186 End stage renal disease: Secondary | ICD-10-CM | POA: Diagnosis present

## 2016-09-16 DIAGNOSIS — R509 Fever, unspecified: Secondary | ICD-10-CM

## 2016-09-16 DIAGNOSIS — E1142 Type 2 diabetes mellitus with diabetic polyneuropathy: Secondary | ICD-10-CM | POA: Diagnosis present

## 2016-09-16 DIAGNOSIS — R531 Weakness: Secondary | ICD-10-CM

## 2016-09-16 DIAGNOSIS — E1122 Type 2 diabetes mellitus with diabetic chronic kidney disease: Secondary | ICD-10-CM | POA: Diagnosis present

## 2016-09-16 DIAGNOSIS — F17200 Nicotine dependence, unspecified, uncomplicated: Secondary | ICD-10-CM | POA: Diagnosis present

## 2016-09-16 DIAGNOSIS — E785 Hyperlipidemia, unspecified: Secondary | ICD-10-CM | POA: Diagnosis present

## 2016-09-16 DIAGNOSIS — R0600 Dyspnea, unspecified: Secondary | ICD-10-CM

## 2016-09-16 HISTORY — DX: Reserved for inherently not codable concepts without codable children: IMO0001

## 2016-09-16 HISTORY — DX: Anemia, unspecified: D64.9

## 2016-09-16 LAB — RETICULOCYTES
RBC.: 2.82 MIL/uL — ABNORMAL LOW (ref 3.87–5.11)
RETIC COUNT ABSOLUTE: 76.1 10*3/uL (ref 19.0–186.0)
Retic Ct Pct: 2.7 % (ref 0.4–3.1)

## 2016-09-16 LAB — FERRITIN: FERRITIN: 7 ng/mL — AB (ref 11–307)

## 2016-09-16 LAB — BASIC METABOLIC PANEL
Anion gap: 10 (ref 5–15)
BUN: 40 mg/dL — AB (ref 6–20)
CALCIUM: 8.4 mg/dL — AB (ref 8.9–10.3)
CO2: 24 mmol/L (ref 22–32)
CREATININE: 2.32 mg/dL — AB (ref 0.44–1.00)
Chloride: 105 mmol/L (ref 101–111)
GFR calc Af Amer: 24 mL/min — ABNORMAL LOW (ref >60)
GFR, EST NON AFRICAN AMERICAN: 21 mL/min — AB (ref >60)
GLUCOSE: 64 mg/dL — AB (ref 65–99)
Potassium: 3.1 mmol/L — ABNORMAL LOW (ref 3.5–5.1)
Sodium: 139 mmol/L (ref 135–145)

## 2016-09-16 LAB — GLUCOSE, CAPILLARY
GLUCOSE-CAPILLARY: 92 mg/dL (ref 65–99)
Glucose-Capillary: 167 mg/dL — ABNORMAL HIGH (ref 65–99)

## 2016-09-16 LAB — CBC
HCT: 20.5 % — ABNORMAL LOW (ref 36.0–46.0)
Hemoglobin: 5.3 g/dL — CL (ref 12.0–15.0)
MCH: 17.3 pg — AB (ref 26.0–34.0)
MCHC: 25.9 g/dL — AB (ref 30.0–36.0)
MCV: 66.8 fL — ABNORMAL LOW (ref 78.0–100.0)
PLATELETS: 588 10*3/uL — AB (ref 150–400)
RBC: 3.07 MIL/uL — ABNORMAL LOW (ref 3.87–5.11)
RDW: 19.8 % — AB (ref 11.5–15.5)
WBC: 9.6 10*3/uL (ref 4.0–10.5)

## 2016-09-16 LAB — BRAIN NATRIURETIC PEPTIDE: B Natriuretic Peptide: 450.4 pg/mL — ABNORMAL HIGH (ref 0.0–100.0)

## 2016-09-16 LAB — CBG MONITORING, ED
GLUCOSE-CAPILLARY: 106 mg/dL — AB (ref 65–99)
GLUCOSE-CAPILLARY: 79 mg/dL (ref 65–99)
GLUCOSE-CAPILLARY: 103 mg/dL — AB (ref 65–99)
Glucose-Capillary: 59 mg/dL — ABNORMAL LOW (ref 65–99)

## 2016-09-16 LAB — IRON AND TIBC
IRON: 11 ug/dL — AB (ref 28–170)
Saturation Ratios: 3 % — ABNORMAL LOW (ref 10.4–31.8)
TIBC: 346 ug/dL (ref 250–450)
UIBC: 335 ug/dL

## 2016-09-16 LAB — RAPID URINE DRUG SCREEN, HOSP PERFORMED
Amphetamines: NOT DETECTED
Barbiturates: NOT DETECTED
Benzodiazepines: NOT DETECTED
Cocaine: NOT DETECTED
Opiates: NOT DETECTED
Tetrahydrocannabinol: NOT DETECTED

## 2016-09-16 LAB — I-STAT TROPONIN, ED: TROPONIN I, POC: 0.02 ng/mL (ref 0.00–0.08)

## 2016-09-16 LAB — POC OCCULT BLOOD, ED: Fecal Occult Bld: NEGATIVE

## 2016-09-16 LAB — FOLATE: Folate: 8.4 ng/mL (ref >5.9)

## 2016-09-16 LAB — VITAMIN B12: VITAMIN B 12: 349 pg/mL (ref 180–914)

## 2016-09-16 LAB — PREPARE RBC (CROSSMATCH)

## 2016-09-16 MED ORDER — DEXTROSE 50 % IV SOLN: 25.0000 mL | Freq: Once | INTRAVENOUS | Status: DC

## 2016-09-16 MED ORDER — SODIUM CHLORIDE 0.9 % IV SOLN
INTRAVENOUS | Status: DC
  Administered 2016-09-16: 09:00:00 via INTRAVENOUS

## 2016-09-16 MED ORDER — ONDANSETRON HCL 4 MG PO TABS: 4.0000 mg | ORAL_TABLET | Freq: Four times a day (QID) | ORAL | Status: DC | PRN

## 2016-09-16 MED ORDER — INSULIN GLARGINE 100 UNIT/ML ~~LOC~~ SOLN
25.0000 [IU] | Freq: Every day | SUBCUTANEOUS | Status: DC
  Administered 2016-09-16 – 2016-09-17 (×2): 25 [IU] via SUBCUTANEOUS
  Filled 2016-09-16 (×3): qty 0.25

## 2016-09-16 MED ORDER — DEXTROSE 50 % IV SOLN
INTRAVENOUS | Status: AC
  Filled 2016-09-16: qty 50

## 2016-09-16 MED ORDER — MOMETASONE FURO-FORMOTEROL FUM 200-5 MCG/ACT IN AERO
2.0000 | INHALATION_SPRAY | Freq: Two times a day (BID) | RESPIRATORY_TRACT | Status: DC
  Administered 2016-09-17 – 2016-09-22 (×9): 2 via RESPIRATORY_TRACT
  Filled 2016-09-16 (×2): qty 8.8

## 2016-09-16 MED ORDER — SODIUM CHLORIDE 0.9% FLUSH
3.0000 mL | Freq: Two times a day (BID) | INTRAVENOUS | Status: DC
  Administered 2016-09-16 – 2016-09-23 (×15): 3 mL via INTRAVENOUS

## 2016-09-16 MED ORDER — ACETAMINOPHEN 325 MG PO TABS
650.0000 mg | ORAL_TABLET | Freq: Four times a day (QID) | ORAL | Status: DC | PRN
  Administered 2016-09-17 – 2016-09-18 (×3): 650 mg via ORAL
  Filled 2016-09-16 (×3): qty 2

## 2016-09-16 MED ORDER — METOPROLOL TARTRATE 12.5 MG HALF TABLET
12.5000 mg | ORAL_TABLET | Freq: Two times a day (BID) | ORAL | Status: DC
  Administered 2016-09-16 – 2016-09-23 (×14): 12.5 mg via ORAL
  Filled 2016-09-16 (×15): qty 1

## 2016-09-16 MED ORDER — DEXTROSE 50 % IV SOLN
50.0000 mL | Freq: Once | INTRAVENOUS | Status: AC
  Administered 2016-09-16: 50 mL via INTRAVENOUS

## 2016-09-16 MED ORDER — SODIUM CHLORIDE 0.9 % IV SOLN
510.0000 mg | Freq: Once | INTRAVENOUS | Status: AC
  Administered 2016-09-17: 510 mg via INTRAVENOUS
  Filled 2016-09-16 (×2): qty 17

## 2016-09-16 MED ORDER — HEPARIN SODIUM (PORCINE) 5000 UNIT/ML IJ SOLN
5000.0000 [IU] | Freq: Three times a day (TID) | INTRAMUSCULAR | Status: DC
  Administered 2016-09-16 – 2016-09-22 (×15): 5000 [IU] via SUBCUTANEOUS
  Filled 2016-09-16 (×15): qty 1

## 2016-09-16 MED ORDER — INSULIN ASPART 100 UNIT/ML ~~LOC~~ SOLN
0.0000 [IU] | Freq: Every day | SUBCUTANEOUS | Status: DC
  Administered 2016-09-17: 4 [IU] via SUBCUTANEOUS
  Administered 2016-09-18: 3 [IU] via SUBCUTANEOUS
  Administered 2016-09-22: 2 [IU] via SUBCUTANEOUS

## 2016-09-16 MED ORDER — ONDANSETRON HCL 4 MG/2ML IJ SOLN: 4.0000 mg | Freq: Four times a day (QID) | INTRAMUSCULAR | Status: DC | PRN

## 2016-09-16 MED ORDER — HYDRALAZINE HCL 20 MG/ML IJ SOLN: 5.0000 mg | INTRAMUSCULAR | Status: DC | PRN

## 2016-09-16 MED ORDER — SODIUM CHLORIDE 0.9 % IV SOLN
10.0000 mL/h | Freq: Once | INTRAVENOUS | Status: AC
  Administered 2016-09-16: 10 mL/h via INTRAVENOUS

## 2016-09-16 MED ORDER — TRAMADOL HCL 50 MG PO TABS
50.0000 mg | ORAL_TABLET | Freq: Three times a day (TID) | ORAL | Status: DC
  Administered 2016-09-16 – 2016-09-23 (×21): 50 mg via ORAL
  Filled 2016-09-16 (×21): qty 1

## 2016-09-16 MED ORDER — SODIUM CHLORIDE 0.9 % IV SOLN: 250.0000 mL | INTRAVENOUS | Status: DC | PRN

## 2016-09-16 MED ORDER — INSULIN GLARGINE 100 UNIT/ML SOLOSTAR PEN: 25.0000 [IU] | PEN_INJECTOR | Freq: Every day | SUBCUTANEOUS | Status: DC

## 2016-09-16 MED ORDER — GABAPENTIN 300 MG PO CAPS
300.0000 mg | ORAL_CAPSULE | Freq: Every day | ORAL | Status: DC
  Administered 2016-09-16 – 2016-09-22 (×7): 300 mg via ORAL
  Filled 2016-09-16 (×7): qty 1

## 2016-09-16 MED ORDER — ASPIRIN 325 MG PO TABS
325.0000 mg | ORAL_TABLET | Freq: Every day | ORAL | Status: DC
  Administered 2016-09-16 – 2016-09-23 (×8): 325 mg via ORAL
  Filled 2016-09-16 (×9): qty 1

## 2016-09-16 MED ORDER — INSULIN ASPART 100 UNIT/ML ~~LOC~~ SOLN
0.0000 [IU] | Freq: Three times a day (TID) | SUBCUTANEOUS | Status: DC
  Administered 2016-09-17 – 2016-09-18 (×3): 5 [IU] via SUBCUTANEOUS
  Administered 2016-09-18 (×2): 1 [IU] via SUBCUTANEOUS
  Administered 2016-09-19: 2 [IU] via SUBCUTANEOUS
  Administered 2016-09-19 – 2016-09-20 (×2): 1 [IU] via SUBCUTANEOUS
  Administered 2016-09-21: 7 [IU] via SUBCUTANEOUS
  Administered 2016-09-21: 2 [IU] via SUBCUTANEOUS
  Administered 2016-09-22 (×2): 1 [IU] via SUBCUTANEOUS

## 2016-09-16 MED ORDER — DEXTROSE 50 % IV SOLN: 50.0000 mL | Freq: Once | INTRAVENOUS | Status: DC

## 2016-09-16 MED ORDER — LATANOPROST 0.005 % OP SOLN
1.0000 [drp] | Freq: Every day | OPHTHALMIC | Status: DC
  Administered 2016-09-16 – 2016-09-22 (×7): 1 [drp] via OPHTHALMIC
  Filled 2016-09-16: qty 2.5

## 2016-09-16 MED ORDER — ACETAMINOPHEN 650 MG RE SUPP: 650.0000 mg | Freq: Four times a day (QID) | RECTAL | Status: DC | PRN

## 2016-09-16 MED ORDER — SODIUM CHLORIDE 0.9% FLUSH
3.0000 mL | Freq: Two times a day (BID) | INTRAVENOUS | Status: DC
  Administered 2016-09-16 (×2): 3 mL via INTRAVENOUS

## 2016-09-16 MED ORDER — LORATADINE 10 MG PO TABS
10.0000 mg | ORAL_TABLET | Freq: Every day | ORAL | Status: DC
  Administered 2016-09-16 – 2016-09-23 (×8): 10 mg via ORAL
  Filled 2016-09-16 (×8): qty 1

## 2016-09-16 MED ORDER — FUROSEMIDE 10 MG/ML IJ SOLN
40.0000 mg | Freq: Two times a day (BID) | INTRAMUSCULAR | Status: DC
  Administered 2016-09-16 – 2016-09-17 (×2): 40 mg via INTRAVENOUS
  Filled 2016-09-16 (×2): qty 4

## 2016-09-16 MED ORDER — BUPROPION HCL ER (SR) 150 MG PO TB12
150.0000 mg | ORAL_TABLET | Freq: Two times a day (BID) | ORAL | Status: DC
  Administered 2016-09-16 – 2016-09-23 (×14): 150 mg via ORAL
  Filled 2016-09-16 (×14): qty 1

## 2016-09-16 MED ORDER — SODIUM CHLORIDE 0.9% FLUSH: 3.0000 mL | INTRAVENOUS | Status: DC | PRN

## 2016-09-16 MED ORDER — BRINZOLAMIDE 1 % OP SUSP
1.0000 [drp] | Freq: Three times a day (TID) | OPHTHALMIC | Status: DC
  Administered 2016-09-16 – 2016-09-22 (×20): 1 [drp] via OPHTHALMIC
  Filled 2016-09-16: qty 10

## 2016-09-16 MED ORDER — POTASSIUM CHLORIDE CRYS ER 20 MEQ PO TBCR
40.0000 meq | EXTENDED_RELEASE_TABLET | Freq: Once | ORAL | Status: AC
  Administered 2016-09-16: 40 meq via ORAL
  Filled 2016-09-16: qty 2

## 2016-09-16 MED ORDER — FERROUS SULFATE 325 (65 FE) MG PO TABS
325.0000 mg | ORAL_TABLET | Freq: Two times a day (BID) | ORAL | Status: DC
  Administered 2016-09-16 – 2016-09-23 (×14): 325 mg via ORAL
  Filled 2016-09-16 (×14): qty 1

## 2016-09-16 MED ORDER — PRAVASTATIN SODIUM 40 MG PO TABS
40.0000 mg | ORAL_TABLET | Freq: Every day | ORAL | Status: DC
  Administered 2016-09-16 – 2016-09-22 (×7): 40 mg via ORAL
  Filled 2016-09-16 (×7): qty 1

## 2016-09-16 NOTE — ED Provider Notes (Signed)
[facility] DEPT Provider Note   CSN: [id number] Arrival date & time: 09/16/16  5329     History   Chief Complaint Chief Complaint  Patient presents with  . Shortness of Breath    HPI [name] [name] is a 65 y.o. female.  Patient w hx cad, cabg 2013, chf, c/o feeling sob for the past 1-2 weeks. Symptoms are persistent, moderate, constant, worse in past couple days. Worse w lying flat. No chest pain. Occasional non prod cough. No fever or chills. Compliant w normal meds.  Pt unaware of wt change.     Shortness of Breath  Associated symptoms include cough. Pertinent negatives include no fever, no headaches, no sore throat, no neck pain, no chest pain, no abdominal pain and no rash.    Past Medical History:  Diagnosis Date  . Arthritis of left shoulder region 03/23/2013  . Chronic combined systolic and diastolic CHF (congestive heart failure) (HCC)    a. EF 40-45%, mild LVH, mid apicalanteroseptal and apical HK.  . CKD (chronic kidney disease), stage III   . Cocaine abuse    crack cocaine heavily until 2008 then sporadic use since then  . Coronary artery disease    a. 06/2012 NSTEMI/CABG x 3 (LIMA->LAD, VG->OM2, VG->LCX);  b. 04/2015 MV: EF<30%, mid ant, apicalanterior, apical infarct;  c. 04/2015 Cath: LM nl, LAD 90p, LCX 22m, OM1 min irregs, RCA mild dzs, LIMA->LAD nl w/ dist LAD dzs, VG->OM2 nl, VG->LCX nl-->Med Rx.  . CVA (cerebral infarction)    a. right internal capsule stroke in 12/2006  . Diabetes mellitus    diagnosed in 2008  . Essential hypertension   . Glaucoma   . Hyperlipidemia   . Left-sided sensory deficit present   . PVD (peripheral vascular disease) ([place])    a. 06/2012 ABI's: R - 0.73, L - 0.71.  Marland Kitchen Thyroid nodule    FNA in 9242 showed follicular cells but not definate neoplasm  . Tobacco abuse     Patient Active Problem List   Diagnosis Date Noted  . Obesity 04/29/2016  . Hiatal hernia   . Heme + stool   . Absolute anemia   . Bleeding  gastrointestinal   . Insulin dependent diabetes mellitus ([place])   . Pain in the chest   . Demand ischemia ([place])   . Iron deficiency anemia   . Precordial pain   . Chest pain 04/17/2016  . Thrombocytosis ([place]) 04/17/2016  . Microcytic anemia 02/05/2016  . Abscess   . Acute systolic heart failure ([place])   . Acute on chronic renal failure ([place])   . CAD in native artery   . Substance abuse   . Chronic combined systolic and diastolic CHF (congestive heart failure) ([place])   . CKD (chronic kidney disease) stage 4, GFR 15-29 ml/min (HCC)   . Demand ischemia of myocardium - related to Acue HF exacerbation 05/14/2015  . Morbid obesity ([place])   . Acute on chronic combined systolic and diastolic congestive heart failure ([place]) 05/12/2015  . Acute respiratory failure ([place]) 10/18/2014  . Type 2 diabetes mellitus with renal manifestations, controlled ([place]) 10/18/2014  . S/P CABG (coronary artery bypass graft) 09/21/2013  . Arthritis of right shoulder region 03/23/2013  . Tobacco abuse   . Coronary artery disease   . NSTEMI (non-ST elevated myocardial infarction) ([place]) 06/29/2012  . History of cocaine abuse 06/29/2012  . Acute kidney injury ([place]) 06/29/2012  . Essential hypertension 06/29/2012  . RBC microcytosis 06/29/2012  . Glaucoma   .  Hyperlipidemia   . History of CVA (cerebrovascular accident)     Past Surgical History:  Procedure Laterality Date  . CARDIAC CATHETERIZATION    . CARDIAC CATHETERIZATION N/A 05/17/2015   Procedure: Left Heart Cath and Cors/Grafts Angiography;  Surgeon: [name], MD;  Location: [facility];  Service: Cardiovascular;  Laterality: N/A;  . COLONOSCOPY WITH PROPOFOL N/A 04/21/2016   Procedure: COLONOSCOPY WITH PROPOFOL;  Surgeon: Irene Shipper, MD;  Location: [facility];  Service: Endoscopy;  Laterality: N/A;  . CORONARY ARTERY BYPASS GRAFT  07/09/2012   Procedure: CORONARY ARTERY BYPASS GRAFTING (CABG);  Surgeon: Ivin Poot, MD;  Location: [facility];   Service: Open Heart Surgery;  Laterality: N/A;  . ESOPHAGOGASTRODUODENOSCOPY N/A 04/20/2016   Procedure: ESOPHAGOGASTRODUODENOSCOPY (EGD);  Surgeon: Gatha Mayer, MD;  Location: [facility];  Service: Endoscopy;  Laterality: N/A;  . LEFT HEART CATHETERIZATION WITH CORONARY ANGIOGRAM N/A 06/29/2012   Procedure: LEFT HEART CATHETERIZATION WITH CORONARY ANGIOGRAM;  Surgeon: Peter M Martinique, MD;  Location: [facility] CATH LAB;  Service: Cardiovascular;  Laterality: N/A;  . STERNAL WOUND DEBRIDEMENT  08/17/2012   Procedure: STERNAL WOUND DEBRIDEMENT;  Surgeon: Ivin Poot, MD;  Location[facility] OR;  Service: Thoracic;  Laterality: N/A;  wound vac application  . STERNAL WOUND DEBRIDEMENT  08/24/2012   Procedure: STERNAL WOUND DEBRIDEMENT;  Surgeon: Ivin Poot, MD;  Location: [facility];  Service: Thoracic;  Laterality: N/A;  . STERNAL WOUND DEBRIDEMENT  09/01/2012   Procedure: STERNAL WOUND DEBRIDEMENT;  Surgeon: Ivin Poot, MD;  Location: [facility];  Service: Thoracic;  Laterality: N/A;  . STERNAL WOUND DEBRIDEMENT  09/20/2012   Procedure: STERNAL WOUND DEBRIDEMENT;  Surgeon: Ivin Poot, MD;  Location: [facility] OR;  Service: Thoracic;  Laterality: N/A;  wound vac change    OB History    No data available       Home Medications    Prior to Admission medications   Medication Sig Start Date End Date Taking? Authorizing Provider  aspirin EC 81 MG EC tablet Take 1 tablet (81 mg total) by mouth daily. 01/16/16   [name], DO  brinzolamide (AZOPT) 1 % ophthalmic suspension Place 1 drop into both eyes 3 (three) times daily.    Historical Provider, MD  buPROPion (WELLBUTRIN SR) 150 MG 12 hr tablet Take 1 tablet (150 mg total) by mouth 2 (two) times daily. 01/16/16   [name], DO  cetirizine (ZYRTEC) 10 MG tablet Take 10 mg by mouth daily as needed for allergies.     Historical Provider, MD  ferrous sulfate 325 (65 FE) MG tablet Take 1 tablet (325 mg total) by mouth 2 (two) times daily with a meal.  04/21/16   Bonnielee Haff, MD  Fluticasone-Salmeterol (ADVAIR) 250-50 MCG/DOSE AEPB Inhale 1 puff into the lungs every 12 (twelve) hours.    Historical Provider, MD  furosemide (LASIX) 40 MG tablet Take 1 tablet (40 mg total) by mouth daily. Take additional dose if you experience weight gain. 05/17/15   [name], DO  gabapentin (NEURONTIN) 300 MG capsule 300 mg at bedtime. 01/31/16   Historical Provider, MD  glimepiride (AMARYL) 4 MG tablet Take 4 mg by mouth daily. 04/07/16   Historical Provider, MD  Insulin Glargine (LANTUS SOLOSTAR) 100 UNIT/ML Solostar Pen Inject 25 Units into the skin daily at 10 pm. 01/16/16   [name], DO  latanoprost (XALATAN) 0.005 % ophthalmic solution Place 1 drop into both eyes at bedtime.    Historical Provider,  MD  metoprolol tartrate (LOPRESSOR) 25 MG tablet Take 0.5 tablets (12.5 mg total) by mouth 2 (two) times daily. 01/16/16   [name], DO  pravastatin (PRAVACHOL) 40 MG tablet Take 40 mg by mouth at bedtime.    Historical Provider, MD  traMADol (ULTRAM) 50 MG tablet Take 50 mg by mouth 3 (three) times daily.     Historical Provider, MD    Family History Family History  Problem Relation Age of Onset  . Diabetes Mother   . Hypertension Mother   . Hyperlipidemia Father   . Other      no known family CAD    Social History Social History  Substance Use Topics  . Smoking status: Current Some Day Smoker    Packs/day: 0.25  . Smokeless tobacco: Never Used  . Alcohol use No     Allergies   Naproxen   Review of Systems Review of Systems  Constitutional: Negative for chills and fever.  HENT: Negative for sore throat.   Eyes: Negative for redness.  Respiratory: Positive for cough and shortness of breath.   Cardiovascular: Negative for chest pain.  Gastrointestinal: Negative for abdominal pain.  Genitourinary: Negative for flank pain.  Musculoskeletal: Negative for back pain and neck pain.  Skin: Negative for rash.  Neurological:  Negative for headaches.  Hematological: Does not bruise/bleed easily.  Psychiatric/Behavioral: Negative for confusion.     Physical Exam Updated Vital Signs BP 129/86 (BP Location: Right Arm)   Pulse 84   Temp 99.7 F (37.6 C) (Oral)   Resp 18   SpO2 99%   Physical Exam  Constitutional: She appears well-developed and well-nourished. No distress.  HENT:  Mouth/Throat: Oropharynx is clear and moist.  Eyes: Conjunctivae are normal. No scleral icterus.  Neck: Neck supple. No tracheal deviation present.  Cardiovascular: Normal rate.   Pulmonary/Chest: Effort normal and breath sounds normal. No respiratory distress.  Abdominal: Soft. Normal appearance and bowel sounds are normal. She exhibits no distension. There is no tenderness.  Musculoskeletal: She exhibits edema.  Mild lower leg and ankle edema.  Neurological: She is alert.  Skin: Skin is warm and dry. No rash noted. She is not diaphoretic.  Psychiatric: She has a normal mood and affect.  Nursing note and vitals reviewed.    ED Treatments / Results  Labs (all labs ordered are listed, but only abnormal results are displayed) Results for orders placed or performed during the hospital encounter of 09/16/16  CBC  Result Value Ref Range   WBC 9.6 4.0 - 10.5 K/uL   RBC 3.07 (L) 3.87 - 5.11 MIL/uL   Hemoglobin 5.3 (LL) 12.0 - 15.0 g/dL   HCT 20.5 (L) 36.0 - 46.0 %   MCV 66.8 (L) 78.0 - 100.0 fL   MCH 17.3 (L) 26.0 - 34.0 pg   MCHC 25.9 (L) 30.0 - 36.0 g/dL   RDW 19.8 (H) 11.5 - 15.5 %   Platelets 588 (H) 150 - 400 K/uL  Basic metabolic panel  Result Value Ref Range   Sodium 139 135 - 145 mmol/L   Potassium 3.1 (L) 3.5 - 5.1 mmol/L   Chloride 105 101 - 111 mmol/L   CO2 24 22 - 32 mmol/L   Glucose, Bld 64 (L) 65 - 99 mg/dL   BUN 40 (H) 6 - 20 mg/dL   Creatinine, Ser 2.32 (H) 0.44 - 1.00 mg/dL   Calcium 8.4 (L) 8.9 - 10.3 mg/dL   GFR calc non Af Amer 21 (L) >60 mL/min  GFR calc Af Amer 24 (L) >60 mL/min   Anion gap  10 5 - 15  I-stat troponin, ED  Result Value Ref Range   Troponin i, poc 0.02 0.00 - 0.08 ng/mL   Comment 3          POC occult blood, ED Provider will collect  Result Value Ref Range   Fecal Occult Bld NEGATIVE NEGATIVE   Dg Chest Port 1 View  Result Date: 09/16/2016 CLINICAL DATA:  Shortness of Breath EXAM: PORTABLE CHEST 1 VIEW COMPARISON:  April 17, 2016 FINDINGS: There is no edema or consolidation. Heart is borderline enlarged with pulmonary venous hypertension. Patient is status post coronary artery bypass grafting. No adenopathy. No bone lesions. IMPRESSION: Pulmonary vascular congestion without frank edema or consolidation. Stable cardiac silhouette. Electronically Signed   By: Lowella Grip III M.D.   On: 09/16/2016 08:07    EKG  EKG Interpretation  Date/Time:  Tuesday September 16 2016 07:26:05 EDT Ventricular Rate:  84 PR Interval:  158 QRS Duration: 88 QT Interval:  428 QTC Calculation: 505 R Axis:   35 Text Interpretation:  Normal sinus rhythm with sinus arrhythmia Nonspecific T wave abnormality Confirmed by Ashok Cordia  MD, Lennette Bihari ([id number]) on 09/16/2016 7:35:28 AM       Radiology No results found.  Procedures Procedures (including critical care time)  Medications Ordered in ED Medications  0.9 %  sodium chloride infusion (not administered)     Initial Impression / Assessment and Plan / ED Course  I have reviewed the triage vital signs and the nursing notes.  Pertinent labs & imaging results that were available during my care of the patient were reviewed by me and considered in my medical decision making (see chart for details).  Clinical Course    Iv ns. Continuous pulse ox and monitor. o2 [place]. Labx. Ecg. Cxr.   Reviewed nursing notes and prior charts for additional history.   Hbg returns very low at 5.  Patient with hx anemia, states is supposed to be taking iron, but hasnt been taking for months. Denies melena or acute blood loss.  Feel dyspnea, chf,  likely due to/exacerbated by severe anemia.    Will transfuse 2 units.   Blood sugars 50s, pt weak/drowsy, d50 and will try to get to take po.  k low, kcl po.  Hospitalists consulted for admission.     Final Clinical Impressions(s) / ED Diagnoses   Final diagnoses:  None    New Prescriptions New Prescriptions   No medications on file     Lajean Saver, MD 09/16/16 [phone]

## 2016-09-16 NOTE — ED Notes (Signed)
Attempted report and blood transfusion complete

## 2016-09-16 NOTE — ED Notes (Signed)
Report called to [employer]

## 2016-09-16 NOTE — ED Notes (Signed)
Pt's CBG result: 79, informed Jessica-RN.

## 2016-09-16 NOTE — ED Notes (Signed)
  CBG 103  

## 2016-09-16 NOTE — ED Notes (Signed)
Verified blood unit # [id number] with Michelene Heady and this RN started at [phone]

## 2016-09-16 NOTE — H&P (Signed)
History and Physical    [name] [name] MWU:[id number] DOB: 1951-07-30 DOA: 09/16/2016  PCP: Rogers Blocker, MD Patient coming from: home  Chief Complaint: sob  HPI: [name] [name] is a 65 y.o. 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 Initially intermittent but now fairly constant. Worse with exertion. Worse while lying flat. Denies any chest pain palpitations, fever, cough, dysuria, frequency, back pain, neck stiffness, headache, LOC, dizziness, vertigo. Associated with lower extremity swelling bilaterally and weight gain. No change medications. Denies any melena, hematochezia, bright red blood per rectum, hematemesis. Patient does not take home iron as it is on her medication list, last colonoscopy in April 2017 which was reportedly normal.   ED Course: Addictive findings outlined below. 2 units of PRBCs ordered for patient.  Review of Systems: As per HPI otherwise 10 point review of systems negative.   Ambulatory Status:restricted by weight and LLE weakness from stroke  Past Medical History:  Diagnosis Date  . Arthritis of left shoulder region 03/23/2013  . Chronic combined systolic and diastolic CHF (congestive heart failure) (HCC)    a. EF 40-45%, mild LVH, mid apicalanteroseptal and apical HK.  . CKD (chronic kidney disease), stage III   . Cocaine abuse    crack cocaine heavily until 2008 then sporadic use since then  . Coronary artery disease    a. 06/2012 NSTEMI/CABG x 3 (LIMA->LAD, VG->OM2, VG->LCX);  b. 04/2015 MV: EF<30%, mid ant, apicalanterior, apical infarct;  c. 04/2015 Cath: LM nl, LAD 90p, LCX 16m, OM1 min irregs, RCA mild dzs, LIMA->LAD nl w/ dist LAD dzs, VG->OM2 nl, VG->LCX nl-->Med Rx.  . CVA (cerebral infarction)    a. right internal capsule stroke in 12/2006  .  Diabetes mellitus    diagnosed in 2008  . Essential hypertension   . Glaucoma   . Hyperlipidemia   . Left-sided sensory deficit present   . PVD (peripheral vascular disease) ([place])    a. 06/2012 ABI's: R - 0.73, L - 0.71.  Marland Kitchen Thyroid nodule    FNA in 7253 showed follicular cells but not definate neoplasm  . Tobacco abuse     Past Surgical History:  Procedure Laterality Date  . CARDIAC CATHETERIZATION    . CARDIAC CATHETERIZATION N/A 05/17/2015   Procedure: Left Heart Cath and Cors/Grafts Angiography;  Surgeon: [name], MD;  Location: [place] [facility];  Service: Cardiovascular;  Laterality: N/A;  . COLONOSCOPY WITH PROPOFOL N/A 04/21/2016   Procedure: COLONOSCOPY WITH PROPOFOL;  Surgeon: Irene Shipper, MD;  Location: [place];  Service: Endoscopy;  Laterality: N/A;  . CORONARY ARTERY BYPASS GRAFT  07/09/2012   Procedure: CORONARY ARTERY BYPASS GRAFTING (CABG);  Surgeon: Ivin Poot, MD;  Location: [place];  Service: Open Heart Surgery;  Laterality: N/A;  . ESOPHAGOGASTRODUODENOSCOPY N/A 04/20/2016   Procedure: ESOPHAGOGASTRODUODENOSCOPY (EGD);  Surgeon: Gatha Mayer, MD;  Location: [facility] ENDOSCOPY;  Service: Endoscopy;  Laterality: N/A;  . LEFT HEART CATHETERIZATION WITH CORONARY ANGIOGRAM N/A 06/29/2012   Procedure: LEFT HEART CATHETERIZATION WITH CORONARY ANGIOGRAM;  Surgeon: Peter M Martinique, MD;  Location[facility] CATH LAB;  Service: Cardiovascular;  Laterality: N/A;  . STERNAL WOUND DEBRIDEMENT  08/17/2012   Procedure: STERNAL WOUND DEBRIDEMENT;  Surgeon: Ivin Poot, MD;  Location: [facility] OR;  Service: Thoracic;  Laterality: N/A;  wound vac application  .  STERNAL WOUND DEBRIDEMENT  08/24/2012   Procedure: STERNAL WOUND DEBRIDEMENT;  Surgeon: Ivin Poot, MD;  Location: [place];  Service: Thoracic;  Laterality: N/A;  . STERNAL WOUND DEBRIDEMENT  09/01/2012   Procedure: STERNAL WOUND DEBRIDEMENT;  Surgeon: Ivin Poot, MD;  Location: [place];  Service: Thoracic;  Laterality: N/A;  .  STERNAL WOUND DEBRIDEMENT  09/20/2012   Procedure: STERNAL WOUND DEBRIDEMENT;  Surgeon: Ivin Poot, MD;  Location: [facility] OR;  Service: Thoracic;  Laterality: N/A;  wound vac change    Social History   Social History  . Marital status: Single    Spouse name: N/A  . Number of children: N/A  . Years of education: N/A   Occupational History  . Not on file.   Social History Main Topics  . Smoking status: Current Some Day Smoker    Packs/day: 0.25  . Smokeless tobacco: Never Used  . Alcohol use No  . Drug use:     Types: Cocaine  . Sexual activity: No   Other Topics Concern  . Not on file   Social History Narrative   Lives with boyfriend of 20 years.     Allergies  Allergen Reactions  . Naproxen Rash    Family History  Problem Relation Age of Onset  . Diabetes Mother   . Hypertension Mother   . Hyperlipidemia Father   . Other      no known family CAD    Prior to Admission medications   Medication Sig Start Date End Date Taking? Authorizing Provider  aspirin 325 MG tablet Take 325 mg by mouth daily.   Yes Historical Provider, MD  brinzolamide (AZOPT) 1 % ophthalmic suspension Place 1 drop into both eyes 3 (three) times daily.   Yes Historical Provider, MD  buPROPion (WELLBUTRIN SR) 150 MG 12 hr tablet Take 1 tablet (150 mg total) by mouth 2 (two) times daily. 01/16/16  Yes [name], DO  cetirizine (ZYRTEC) 10 MG tablet Take 10 mg by mouth daily as needed for allergies.    Yes Historical Provider, MD  clotrimazole-betamethasone (LOTRISONE) cream Apply 1 application topically 2 (two) times daily. On left hand between fingers 09/15/16  Yes Historical Provider, MD  ferrous sulfate 325 (65 FE) MG tablet Take 1 tablet (325 mg total) by mouth 2 (two) times daily with a meal. 04/21/16  Yes Bonnielee Haff, MD  Fluticasone-Salmeterol (ADVAIR) 250-50 MCG/DOSE AEPB Inhale 1 puff into the lungs every 12 (twelve) hours.   Yes Historical Provider, MD  furosemide (LASIX) 40 MG  tablet Take 1 tablet (40 mg total) by mouth daily. Take additional dose if you experience weight gain. 05/17/15  Yes [name], DO  gabapentin (NEURONTIN) 300 MG capsule Take 300 mg by mouth at bedtime.  01/31/16  Yes Historical Provider, MD  glimepiride (AMARYL) 4 MG tablet Take 4 mg by mouth daily. 04/07/16  Yes Historical Provider, MD  Insulin Glargine (LANTUS SOLOSTAR) 100 UNIT/ML Solostar Pen Inject 25 Units into the skin daily at 10 pm. 01/16/16  Yes [name], DO  latanoprost (XALATAN) 0.005 % ophthalmic solution Place 1 drop into both eyes at bedtime.   Yes Historical Provider, MD  metoprolol tartrate (LOPRESSOR) 25 MG tablet Take 0.5 tablets (12.5 mg total) by mouth 2 (two) times daily. 01/16/16  Yes [name], DO  pravastatin (PRAVACHOL) 40 MG tablet Take 40 mg by mouth at bedtime.   Yes Historical Provider, MD  traMADol (ULTRAM) 50 MG tablet Take 50 mg by mouth  3 (three) times daily.    Yes Historical Provider, MD  aspirin EC 81 MG EC tablet Take 1 tablet (81 mg total) by mouth daily. Patient not taking: Reported on 09/16/2016 01/16/16   Cristal Ford, DO    Physical Exam: Vitals:   09/16/16 1030 09/16/16 1045 09/16/16 1051 09/16/16 1100  BP:  (!) 133/52 (!) 116/46 (!) 104/39  Pulse: 83 82 86 90  Resp:   20   Temp:      TempSrc:      SpO2: 100% 100% 99% 100%  Weight:      Height:         General:  Appears calm and comfortable Eyes:  PERRL, EOMI, normal lids, iris ENT:  grossly normal hearing, lips & tongue, mmm Neck:  no LAD, masses or thyromegaly Cardiovascular:  RRR, II/VI systolic murmur.  Respiratory:  Difficult to fully appreciate due to body habitus. Dementia breath sounds in the bases with few crackles. Normal effort. Abdomen:  soft, ntnd, NABS, morbidly obese. Skin:  no rash or induration seen on limited exam Musculoskeletal:  grossly normal tone BUE/BLE, good ROM, no bony abnormality Psychiatric:  grossly normal mood and affect, speech fluent and  appropriate, AOx3 Neurologic:  CN 2-12 grossly intact,  sensation intact  Labs on Admission: I have personally reviewed following labs and imaging studies  CBC:  Recent Labs Lab 09/16/16 0819  WBC 9.6  HGB 5.3*  HCT 20.5*  MCV 66.8*  PLT 409*   Basic Metabolic Panel:  Recent Labs Lab 09/16/16 0819  NA 139  K 3.1*  CL 105  CO2 24  GLUCOSE 64*  BUN 40*  CREATININE 2.32*  CALCIUM 8.4*   GFR: Estimated Creatinine Clearance: 34.9 mL/min (by C-G formula based on SCr of 2.32 mg/dL (H)). Liver Function Tests: No results for input(s): AST, ALT, ALKPHOS, BILITOT, PROT, ALBUMIN in the last 168 hours. No results for input(s): LIPASE, AMYLASE in the last 168 hours. No results for input(s): AMMONIA in the last 168 hours. Coagulation Profile: No results for input(s): INR, PROTIME in the last 168 hours. Cardiac Enzymes: No results for input(s): CKTOTAL, CKMB, CKMBINDEX, TROPONINI in the last 168 hours. BNP (last 3 results) No results for input(s): PROBNP in the last 8760 hours. HbA1C: No results for input(s): HGBA1C in the last 72 hours. CBG:  Recent Labs Lab 09/16/16 0917  GLUCAP 59*   Lipid Profile: No results for input(s): CHOL, HDL, LDLCALC, TRIG, CHOLHDL, LDLDIRECT in the last 72 hours. Thyroid Function Tests: No results for input(s): TSH, T4TOTAL, FREET4, T3FREE, THYROIDAB in the last 72 hours. Anemia Panel: No results for input(s): VITAMINB12, FOLATE, FERRITIN, TIBC, IRON, RETICCTPCT in the last 72 hours. Urine analysis:    Component Value Date/Time   COLORURINE YELLOW 04/18/2016 0332   APPEARANCEUR CLEAR 04/18/2016 0332   LABSPEC 1.014 04/18/2016 0332   PHURINE 5.5 04/18/2016 0332   GLUCOSEU NEGATIVE 04/18/2016 0332   HGBUR NEGATIVE 04/18/2016 0332   BILIRUBINUR NEGATIVE 04/18/2016 0332   KETONESUR NEGATIVE 04/18/2016 0332   PROTEINUR NEGATIVE 04/18/2016 0332   UROBILINOGEN 0.2 05/12/2015 0938   NITRITE NEGATIVE 04/18/2016 0332   LEUKOCYTESUR NEGATIVE  04/18/2016 0332    Creatinine Clearance: Estimated Creatinine Clearance: 34.9 mL/min (by C-G formula based on SCr of 2.32 mg/dL (H)).  Sepsis Labs: @LABRCNTIP (procalcitonin:4,lacticidven:4) )No results found for this or any previous visit (from the past 240 hour(s)).   Radiological Exams on Admission: Dg Chest Port 1 View  Result Date: 09/16/2016 CLINICAL DATA:  Shortness of  Breath EXAM: PORTABLE CHEST 1 VIEW COMPARISON:  April 17, 2016 FINDINGS: There is no edema or consolidation. Heart is borderline enlarged with pulmonary venous hypertension. Patient is status post coronary artery bypass grafting. No adenopathy. No bone lesions. IMPRESSION: Pulmonary vascular congestion without frank edema or consolidation. Stable cardiac silhouette. Electronically Signed   By: Lowella Grip III M.D.   On: 09/16/2016 08:07    EKG: Independently reviewed.   Assessment/Plan Active Problems:   Glaucoma   Essential hypertension   S/P CABG (coronary artery bypass graft)   Type 2 diabetes mellitus with renal manifestations, controlled (HCC)   CKD (chronic kidney disease) stage 4, GFR 15-29 ml/min (HCC)   Obesity   Symptomatic anemia   Diabetes mellitus with complication (HCC)   Symptomatically anemia.:Hemoglobin 5.3. Baseline hemoglobin 8.5. FOBT negative. No reports of apparent blood loss via GI bleed. Microscopic with MCV of 66.8. Suspect etiology from progressive C KD and iron deficiency. Endoscopy and Colonoscopy in April 2017 without evidence of bleed - medium sized hiatal hernia and single nonbleeding colonic angiodysplastic lesion. Patient does not use any NSAIDs. Doubt recurrent GI bleed.  - 2 units PRBC per EDP - transfusion labs - anemia panel - Suspect low iron, if so then iron transfusion - resume outpt iron - Pt to f/u w/ renal in next 1-2 weeks and will discuss starting aricept or equivalent.   Acute combined systolic and diastolic congestive heart failure: Evidence of acute  Sedation based on CXR. BNP only mildly elevated at 495. Patient with weight gain over the last week with increasing lower extremity pitting edema. Last echo shows EF of 40% and grade 2 diastolic dysfunction. - Lasix after PRBC transfusion - Resume beta blocker when blood pressure improves  CKD: Cr 2.32. At baseline. Suspect worsening to staying at baseline due to anemia and diuresis - BMET in am  DM:  - continue lantus - SSI  CVA/MI/CABG/HTN: BP soft. Awaiting transfusion. Trop neg. EKG w/o ACS - Tele - continue ASA, pravastatin - resume metop when BP improves - Hydralazine IV PRN  Chronic/neuropathic pain: - continue Neurontin  Depression: - contineu wellbutrin   DVT prophylaxis: Hep  Code Status: full  Family Communication: aunts  Disposition Plan: pendingi mproveemnt  Consults called: none  Admission status: obs - tele    MERRELL, DAVID J MD [facility]  If 7PM-7AM, please contact night-coverage [email or web address] Password [email or web address]  09/16/2016, 11:25 AM

## 2016-09-16 NOTE — ED Triage Notes (Addendum)
Per EMS - pt c/o weakness and shortness of breath x 1-2 weeks. C/o nonproductive cough. Lungs clear to auscultation. SpO2 99% RA. Hx triple bypass, CHF, HTN, diabetes. 12-lead inverted t waves. 135/75, hr 93.

## 2016-09-16 NOTE — ED Notes (Signed)
Blood bank called regarding pts blood release. States that they have received the tube and it will take 1 hour to run. Dr. Ashok Cordia made aware of delay and does not want emergency release at this time.

## 2016-09-16 NOTE — ED Notes (Signed)
CBG 106 

## 2016-09-16 NOTE — ED Notes (Signed)
Pt states: " Woke up this morning to go to bathroom and could not get up. Weak legs, sob"

## 2016-09-16 NOTE — ED Notes (Signed)
Upon trying to insert IV and draw blood, pt's blood coagulated immediately in the tube. MD not ified.

## 2016-09-16 NOTE — ED Notes (Signed)
Increased blood to 166ml/hr due to no reaction noted

## 2016-09-17 ENCOUNTER — Encounter (HOSPITAL_COMMUNITY): Payer: Self-pay | Admitting: General Practice

## 2016-09-17 ENCOUNTER — Inpatient Hospital Stay (HOSPITAL_COMMUNITY): Payer: Medicare Other

## 2016-09-17 DIAGNOSIS — E1151 Type 2 diabetes mellitus with diabetic peripheral angiopathy without gangrene: Secondary | ICD-10-CM | POA: Diagnosis present

## 2016-09-17 DIAGNOSIS — Z794 Long term (current) use of insulin: Secondary | ICD-10-CM | POA: Diagnosis not present

## 2016-09-17 DIAGNOSIS — E118 Type 2 diabetes mellitus with unspecified complications: Secondary | ICD-10-CM | POA: Diagnosis not present

## 2016-09-17 DIAGNOSIS — E1122 Type 2 diabetes mellitus with diabetic chronic kidney disease: Secondary | ICD-10-CM | POA: Diagnosis present

## 2016-09-17 DIAGNOSIS — F418 Other specified anxiety disorders: Secondary | ICD-10-CM | POA: Diagnosis present

## 2016-09-17 DIAGNOSIS — N184 Chronic kidney disease, stage 4 (severe): Secondary | ICD-10-CM

## 2016-09-17 DIAGNOSIS — I130 Hypertensive heart and chronic kidney disease with heart failure and stage 1 through stage 4 chronic kidney disease, or unspecified chronic kidney disease: Secondary | ICD-10-CM | POA: Diagnosis present

## 2016-09-17 DIAGNOSIS — Z7982 Long term (current) use of aspirin: Secondary | ICD-10-CM | POA: Diagnosis not present

## 2016-09-17 DIAGNOSIS — H409 Unspecified glaucoma: Secondary | ICD-10-CM | POA: Diagnosis present

## 2016-09-17 DIAGNOSIS — I2510 Atherosclerotic heart disease of native coronary artery without angina pectoris: Secondary | ICD-10-CM | POA: Diagnosis present

## 2016-09-17 DIAGNOSIS — F17200 Nicotine dependence, unspecified, uncomplicated: Secondary | ICD-10-CM | POA: Diagnosis present

## 2016-09-17 DIAGNOSIS — N183 Chronic kidney disease, stage 3 (moderate): Secondary | ICD-10-CM | POA: Diagnosis not present

## 2016-09-17 DIAGNOSIS — E1142 Type 2 diabetes mellitus with diabetic polyneuropathy: Secondary | ICD-10-CM | POA: Diagnosis present

## 2016-09-17 DIAGNOSIS — Z6841 Body Mass Index (BMI) 40.0 and over, adult: Secondary | ICD-10-CM | POA: Diagnosis not present

## 2016-09-17 DIAGNOSIS — Z23 Encounter for immunization: Secondary | ICD-10-CM | POA: Diagnosis not present

## 2016-09-17 DIAGNOSIS — R509 Fever, unspecified: Secondary | ICD-10-CM | POA: Diagnosis not present

## 2016-09-17 DIAGNOSIS — Z79899 Other long term (current) drug therapy: Secondary | ICD-10-CM | POA: Diagnosis not present

## 2016-09-17 DIAGNOSIS — Z951 Presence of aortocoronary bypass graft: Secondary | ICD-10-CM | POA: Diagnosis not present

## 2016-09-17 DIAGNOSIS — D509 Iron deficiency anemia, unspecified: Secondary | ICD-10-CM | POA: Diagnosis present

## 2016-09-17 DIAGNOSIS — E785 Hyperlipidemia, unspecified: Secondary | ICD-10-CM | POA: Diagnosis present

## 2016-09-17 DIAGNOSIS — Z886 Allergy status to analgesic agent status: Secondary | ICD-10-CM | POA: Diagnosis not present

## 2016-09-17 DIAGNOSIS — D649 Anemia, unspecified: Secondary | ICD-10-CM | POA: Diagnosis not present

## 2016-09-17 DIAGNOSIS — R0602 Shortness of breath: Secondary | ICD-10-CM | POA: Diagnosis present

## 2016-09-17 DIAGNOSIS — I69349 Monoplegia of lower limb following cerebral infarction affecting unspecified side: Secondary | ICD-10-CM | POA: Diagnosis not present

## 2016-09-17 DIAGNOSIS — I5043 Acute on chronic combined systolic (congestive) and diastolic (congestive) heart failure: Secondary | ICD-10-CM | POA: Diagnosis present

## 2016-09-17 LAB — HEMOGLOBIN A1C
HEMOGLOBIN A1C: 5.9 % — AB (ref 4.8–5.6)
MEAN PLASMA GLUCOSE: 123 mg/dL

## 2016-09-17 LAB — URINALYSIS, ROUTINE W REFLEX MICROSCOPIC
BILIRUBIN URINE: NEGATIVE
GLUCOSE, UA: NEGATIVE mg/dL
HGB URINE DIPSTICK: NEGATIVE
Ketones, ur: NEGATIVE mg/dL
Leukocytes, UA: NEGATIVE
Nitrite: NEGATIVE
PH: 5.5 (ref 5.0–8.0)
Protein, ur: NEGATIVE mg/dL
SPECIFIC GRAVITY, URINE: 1.012 (ref 1.005–1.030)

## 2016-09-17 LAB — CBC
HEMATOCRIT: 22.9 % — AB (ref 36.0–46.0)
HEMOGLOBIN: 6.3 g/dL — AB (ref 12.0–15.0)
MCH: 19.5 pg — ABNORMAL LOW (ref 26.0–34.0)
MCHC: 27.5 g/dL — ABNORMAL LOW (ref 30.0–36.0)
MCV: 70.9 fL — ABNORMAL LOW (ref 78.0–100.0)
Platelets: 508 10*3/uL — ABNORMAL HIGH (ref 150–400)
RBC: 3.23 MIL/uL — AB (ref 3.87–5.11)
RDW: 21.6 % — ABNORMAL HIGH (ref 11.5–15.5)
WBC: 9 10*3/uL (ref 4.0–10.5)

## 2016-09-17 LAB — DIFFERENTIAL
Basophils Absolute: 0 10*3/uL (ref 0.0–0.1)
Basophils Relative: 0 %
EOS PCT: 1 %
Eosinophils Absolute: 0.1 10*3/uL (ref 0.0–0.7)
LYMPHS ABS: 1.7 10*3/uL (ref 0.7–4.0)
LYMPHS PCT: 20 %
Monocytes Absolute: 1 10*3/uL (ref 0.1–1.0)
Monocytes Relative: 11 %
NEUTROS PCT: 68 %
Neutro Abs: 6 10*3/uL (ref 1.7–7.7)

## 2016-09-17 LAB — COMPREHENSIVE METABOLIC PANEL
ALBUMIN: 2.5 g/dL — AB (ref 3.5–5.0)
ALK PHOS: 98 U/L (ref 38–126)
ALT: 8 U/L — AB (ref 14–54)
ANION GAP: 7 (ref 5–15)
AST: 15 U/L (ref 15–41)
BILIRUBIN TOTAL: 0.7 mg/dL (ref 0.3–1.2)
BUN: 41 mg/dL — AB (ref 6–20)
CALCIUM: 8.5 mg/dL — AB (ref 8.9–10.3)
CO2: 26 mmol/L (ref 22–32)
CREATININE: 2.37 mg/dL — AB (ref 0.44–1.00)
Chloride: 106 mmol/L (ref 101–111)
GFR calc Af Amer: 24 mL/min — ABNORMAL LOW (ref >60)
GFR calc non Af Amer: 20 mL/min — ABNORMAL LOW (ref >60)
GLUCOSE: 87 mg/dL (ref 65–99)
Potassium: 3.6 mmol/L (ref 3.5–5.1)
SODIUM: 139 mmol/L (ref 135–145)
TOTAL PROTEIN: 5.9 g/dL — AB (ref 6.5–8.1)

## 2016-09-17 LAB — HEMOGLOBIN AND HEMATOCRIT, BLOOD
HCT: 24.1 % — ABNORMAL LOW (ref 36.0–46.0)
HEMOGLOBIN: 6.5 g/dL — AB (ref 12.0–15.0)

## 2016-09-17 LAB — PREPARE RBC (CROSSMATCH)

## 2016-09-17 LAB — GLUCOSE, CAPILLARY
GLUCOSE-CAPILLARY: 282 mg/dL — AB (ref 65–99)
Glucose-Capillary: 79 mg/dL (ref 65–99)
Glucose-Capillary: 159 mg/dL — ABNORMAL HIGH (ref 65–99)
Glucose-Capillary: 306 mg/dL — ABNORMAL HIGH (ref 65–99)

## 2016-09-17 MED ORDER — FUROSEMIDE 10 MG/ML IJ SOLN
40.0000 mg | Freq: Two times a day (BID) | INTRAMUSCULAR | Status: AC
  Administered 2016-09-17 – 2016-09-18 (×2): 40 mg via INTRAVENOUS
  Filled 2016-09-17 (×2): qty 4

## 2016-09-17 MED ORDER — INFLUENZA VAC SPLIT QUAD 0.5 ML IM SUSY
0.5000 mL | PREFILLED_SYRINGE | INTRAMUSCULAR | Status: AC
  Administered 2016-09-21: 0.5 mL via INTRAMUSCULAR
  Filled 2016-09-17: qty 0.5

## 2016-09-17 MED ORDER — SODIUM CHLORIDE 0.9 % IV SOLN: Freq: Once | INTRAVENOUS | Status: DC

## 2016-09-17 MED ORDER — PANTOPRAZOLE SODIUM 40 MG PO TBEC
40.0000 mg | DELAYED_RELEASE_TABLET | Freq: Every day | ORAL | Status: DC
  Administered 2016-09-17 – 2016-09-23 (×7): 40 mg via ORAL
  Filled 2016-09-17 (×7): qty 1

## 2016-09-17 NOTE — Progress Notes (Signed)
Pt has an order for blood transfusion but her temp is 101. Tylenol 650 mg given. Practitioner pager and orderd to wait 45 min and repeat temp ;and if temp still >=100 call MD or  if temp less than 100, start transfusion.   Ferdinand Lango, RN

## 2016-09-17 NOTE — Care Management Obs Status (Signed)
[facility] NOTIFICATION   Patient Details  Name: VIBHA FERDIG MRN: [id number] Date of Birth: 08-Jul-1951   Medicare Observation Status Notification Given:  Yes    Ninfa Meeker, RN 09/17/2016, 3:03 PM

## 2016-09-17 NOTE — Progress Notes (Signed)
Repeat temp. 100.2 , Baltazar Najjar, NP paged and gave [facility] to transfuse patient. Report given to day nurse.  Ferdinand Lango, RN

## 2016-09-17 NOTE — Progress Notes (Signed)
PROGRESS NOTE                                                                                                                                                                                                             Patient Demographics:    [name] [name], is a 65 y.o. female, DOB - Jun 11, 1951, UXN:[id number]  Admit date - 09/16/2016   Admitting Physician Waldemar Dickens, MD  Outpatient Primary MD for the patient is Rogers Blocker, MD  LOS - 0  Chief Complaint  Patient presents with  . Shortness of Breath       Brief Narrative    [name] [name] is a 65 y.o. 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 Initially intermittent but now fairly constant. Worse with exertion. Worse while lying flat. Denies any chest pain palpitations, fever, cough, dysuria, frequency, back pain, neck stiffness, headache, LOC, dizziness, vertigo. Associated with lower extremity swelling bilaterally and weight gain. No change medications. Denies any melena, hematochezia, bright red blood per rectum, hematemesis. Patient does not take home iron as it is on her medication list, last colonoscopy in April 2017 which was reportedly normal.   Subjective:    [name] [name] today has, No headache, No chest pain, No abdominal pain - No Nausea, No new weakness tingling or numbness, No Cough - SOB.     Assessment  & Plan :     1.Acute on chronic combined systolic and diastolic CHF. EF 40%. Exacerbated by acute on chronic symptomatically microcytic anemia - we'll transfuse, continue IV Lasix, already much improved, monitor intake and output and daily weights.  2. Recurrent microcytic iron deficiency anemia. Has had workup with Dr. Carlean Purl recently in April with  EGD and colonoscopy, no frank source, we'll place her on PPI, may require small bowel evaluation in the outpatient setting, transfused 3 units of packed RBCs this admission, place on oral iron, follow with Dr. Carlean Purl.  3. Hypertension. On beta blocker.  4. Morbid Obesity. Follow with PCP for weight loss.  5. CK D stage IV. Baseline creatinine close to 2.4. Close to baseline.   6. History of CVA with some left-sided mild deficits. Continue aspirin  and statin for secondary prevention.   7. Anxiety depression. Stable on albuterol.   8. Diabetic peripheral neuropathy. On Neurontin.   9. DM type II. On Lantus and sliding scale.  CBG (last 3)   Recent Labs  09/16/16 1618 09/16/16 2122 09/17/16 0728  GLUCAP 92 167* 62      Family Communication  :  none  Code Status :  Full  Diet : Heart Healthy  Disposition Plan  :  Home in am  Consults  :  None  Procedures  :  None  DVT Prophylaxis  :   SCDs   Lab Results  Component Value Date   PLT 508 (H) 09/17/2016    Inpatient Medications  Scheduled Meds: . aspirin  325 mg Oral Daily  . brinzolamide  1 drop Both Eyes TID  . buPROPion  150 mg Oral BID  . ferrous sulfate  325 mg Oral BID WC  . furosemide  40 mg Intravenous BID  . gabapentin  300 mg Oral QHS  . heparin  5,000 Units Subcutaneous Q8H  . [START ON 09/18/2016] Influenza vac split quadrivalent PF  0.5 mL Intramuscular Tomorrow-1000  . insulin aspart  0-5 Units Subcutaneous QHS  . insulin aspart  0-9 Units Subcutaneous TID WC  . insulin glargine  25 Units Subcutaneous QHS  . latanoprost  1 drop Both Eyes QHS  . loratadine  10 mg Oral Daily  . metoprolol tartrate  12.5 mg Oral BID  . mometasone-formoterol  2 puff Inhalation BID  . pravastatin  40 mg Oral QHS  . sodium chloride flush  3 mL Intravenous Q12H  . traMADol  50 mg Oral TID   Continuous Infusions: . sodium chloride 20 mL/hr at 09/16/16 0859   PRN Meds:.acetaminophen **OR** acetaminophen,  hydrALAZINE, ondansetron **OR** ondansetron (ZOFRAN) IV  Antibiotics  :    Anti-infectives    None         Objective:   Vitals:   09/17/16 0500 09/17/16 0605 09/17/16 0654 09/17/16 0933  BP: (!) 132/59     Pulse: 80     Resp: 20     Temp: (!) 101 F (38.3 C) (!) 101 F (38.3 C) 100.2 F (37.9 C)   TempSrc: Oral Oral Oral   SpO2: 93%   99%  Weight: 133.2 kg (293 lb 9.6 oz)     Height:        Wt Readings from Last 3 Encounters:  09/17/16 133.2 kg (293 lb 9.6 oz)  04/29/16 126.6 kg (279 lb 3.2 oz)  04/21/16 125 kg (275 lb 9.6 oz)     Intake/Output Summary (Last 24 hours) at 09/17/16 1049 Last data filed at 09/16/16 2232  Gross per 24 hour  Intake              575 ml  Output             1000 ml  Net             -425 ml     Physical Exam  Awake Alert, Oriented X 3, No new F.N deficits, Normal affect [place].AT,PERRAL Supple Neck,No JVD, No cervical lymphadenopathy appriciated.  Symmetrical Chest wall movement, Good air movement bilaterally, CTAB RRR,No Gallops,Rubs or new Murmurs, No Parasternal Heave +ve B.Sounds, Abd Soft, No tenderness, No organomegaly appriciated, No rebound - guarding or rigidity. No Cyanosis, Clubbing or edema, No new Rash or bruise       Data Review:    CBC  Recent Labs Lab  09/16/16 0819 09/17/16 0012 09/17/16 0508  WBC 9.6  --  9.0  HGB 5.3* 6.5* 6.3*  HCT 20.5* 24.1* 22.9*  PLT 588*  --  508*  MCV 66.8*  --  70.9*  MCH 17.3*  --  19.5*  MCHC 25.9*  --  27.5*  RDW 19.8*  --  21.6*  LYMPHSABS  --   --  1.7  MONOABS  --   --  1.0  EOSABS  --   --  0.1  BASOSABS  --   --  0.0    Chemistries   Recent Labs Lab 09/16/16 0819 09/17/16 0508  NA 139 139  K 3.1* 3.6  CL 105 106  CO2 24 26  GLUCOSE 64* 87  BUN 40* 41*  CREATININE 2.32* 2.37*  CALCIUM 8.4* 8.5*  AST  --  15  ALT  --  8*  ALKPHOS  --  98  BILITOT  --  0.7    ------------------------------------------------------------------------------------------------------------------ No results for input(s): CHOL, HDL, LDLCALC, TRIG, CHOLHDL, LDLDIRECT in the last 72 hours.  Lab Results  Component Value Date   HGBA1C 5.6 04/17/2016   ------------------------------------------------------------------------------------------------------------------ No results for input(s): TSH, T4TOTAL, T3FREE, THYROIDAB in the last 72 hours.  Invalid input(s): FREET3 ------------------------------------------------------------------------------------------------------------------  Recent Labs  09/16/16 0946  VITAMINB12 349  FOLATE 8.4  FERRITIN 7*  TIBC 346  IRON 11*  RETICCTPCT 2.7    Coagulation profile No results for input(s): INR, PROTIME in the last 168 hours.  No results for input(s): DDIMER in the last 72 hours.  Cardiac Enzymes No results for input(s): CKMB, TROPONINI, MYOGLOBIN in the last 168 hours.  Invalid input(s): CK ------------------------------------------------------------------------------------------------------------------    Component Value Date/Time   BNP 450.4 (H) 09/16/2016 8938    Micro Results No results found for this or any previous visit (from the past 240 hour(s)).  Radiology Reports Dg Chest Port 1 View  Result Date: 09/16/2016 CLINICAL DATA:  Shortness of Breath EXAM: PORTABLE CHEST 1 VIEW COMPARISON:  April 17, 2016 FINDINGS: There is no edema or consolidation. Heart is borderline enlarged with pulmonary venous hypertension. Patient is status post coronary artery bypass grafting. No adenopathy. No bone lesions. IMPRESSION: Pulmonary vascular congestion without frank edema or consolidation. Stable cardiac silhouette. Electronically Signed   By: Lowella Grip III M.D.   On: 09/16/2016 08:07    Time Spent in minutes  30   Taleeyah Bora K M.D on 09/17/2016 at 10:49 AM  Between 7am to 7pm - Pager -  [phone]  After 7pm go to [email or web address] - password [facility]  [facility] -  Office  [phone]

## 2016-09-18 ENCOUNTER — Inpatient Hospital Stay (HOSPITAL_COMMUNITY): Payer: Medicare Other

## 2016-09-18 LAB — BASIC METABOLIC PANEL
Anion gap: 11 (ref 5–15)
Anion gap: 11 (ref 5–15)
BUN: 38 mg/dL — AB (ref 6–20)
BUN: 38 mg/dL — AB (ref 6–20)
CALCIUM: 8.6 mg/dL — AB (ref 8.9–10.3)
CHLORIDE: 105 mmol/L (ref 101–111)
CO2: 23 mmol/L (ref 22–32)
CO2: 23 mmol/L (ref 22–32)
CREATININE: 2.41 mg/dL — AB (ref 0.44–1.00)
Calcium: 8.6 mg/dL — ABNORMAL LOW (ref 8.9–10.3)
Chloride: 104 mmol/L (ref 101–111)
Creatinine, Ser: 2.47 mg/dL — ABNORMAL HIGH (ref 0.44–1.00)
GFR calc Af Amer: 23 mL/min — ABNORMAL LOW (ref >60)
GFR calc Af Amer: 22 mL/min — ABNORMAL LOW (ref >60)
GFR calc non Af Amer: 19 mL/min — ABNORMAL LOW (ref >60)
GFR, EST NON AFRICAN AMERICAN: 20 mL/min — AB (ref >60)
GLUCOSE: 240 mg/dL — AB (ref 65–99)
GLUCOSE: 156 mg/dL — AB (ref 65–99)
POTASSIUM: 3.6 mmol/L (ref 3.5–5.1)
Potassium: 4 mmol/L (ref 3.5–5.1)
Sodium: 138 mmol/L (ref 135–145)
Sodium: 139 mmol/L (ref 135–145)

## 2016-09-18 LAB — HEMOGLOBIN AND HEMATOCRIT, BLOOD
HCT: 26.4 % — ABNORMAL LOW (ref 36.0–46.0)
Hemoglobin: 7.6 g/dL — ABNORMAL LOW (ref 12.0–15.0)

## 2016-09-18 LAB — URINE MICROSCOPIC-ADD ON: RBC / HPF: NONE SEEN RBC/hpf (ref 0–5)

## 2016-09-18 LAB — CBC WITH DIFFERENTIAL/PLATELET
BASOS ABS: 0 10*3/uL (ref 0.0–0.1)
Basophils Relative: 0 %
EOS PCT: 1 %
Eosinophils Absolute: 0.1 10*3/uL (ref 0.0–0.7)
HCT: 25 % — ABNORMAL LOW (ref 36.0–46.0)
Hemoglobin: 7.3 g/dL — ABNORMAL LOW (ref 12.0–15.0)
LYMPHS ABS: 1.1 10*3/uL (ref 0.7–4.0)
LYMPHS PCT: 9 %
MCH: 21.1 pg — AB (ref 26.0–34.0)
MCHC: 29.2 g/dL — ABNORMAL LOW (ref 30.0–36.0)
MCV: 72.3 fL — AB (ref 78.0–100.0)
MONO ABS: 0.8 10*3/uL (ref 0.1–1.0)
Monocytes Relative: 7 %
Neutro Abs: 9.7 10*3/uL — ABNORMAL HIGH (ref 1.7–7.7)
Neutrophils Relative %: 83 %
PLATELETS: 513 10*3/uL — AB (ref 150–400)
RBC: 3.46 MIL/uL — ABNORMAL LOW (ref 3.87–5.11)
RDW: 23.7 % — AB (ref 11.5–15.5)
WBC: 11.7 10*3/uL — ABNORMAL HIGH (ref 4.0–10.5)

## 2016-09-18 LAB — TYPE AND SCREEN
ABO/RH(D): O POS
Antibody Screen: NEGATIVE
Unit division: 0
Unit division: 0
Unit division: 0

## 2016-09-18 LAB — URINALYSIS, ROUTINE W REFLEX MICROSCOPIC
BILIRUBIN URINE: NEGATIVE
GLUCOSE, UA: NEGATIVE mg/dL
Hgb urine dipstick: NEGATIVE
KETONES UR: NEGATIVE mg/dL
Nitrite: NEGATIVE
PH: 5 (ref 5.0–8.0)
Protein, ur: NEGATIVE mg/dL
Specific Gravity, Urine: 1.012 (ref 1.005–1.030)

## 2016-09-18 LAB — GLUCOSE, CAPILLARY
Glucose-Capillary: 139 mg/dL — ABNORMAL HIGH (ref 65–99)
Glucose-Capillary: 135 mg/dL — ABNORMAL HIGH (ref 65–99)
Glucose-Capillary: 280 mg/dL — ABNORMAL HIGH (ref 65–99)

## 2016-09-18 LAB — LACTIC ACID, PLASMA: Lactic Acid, Venous: 2.1 mmol/L (ref 0.5–1.9)

## 2016-09-18 MED ORDER — IOPAMIDOL (ISOVUE-300) INJECTION 61%
INTRAVENOUS | Status: AC
  Administered 2016-09-18: 60 mL
  Filled 2016-09-18: qty 30

## 2016-09-18 MED ORDER — INSULIN ASPART 100 UNIT/ML ~~LOC~~ SOLN
5.0000 [IU] | Freq: Three times a day (TID) | SUBCUTANEOUS | Status: DC
  Administered 2016-09-19: 5 [IU] via SUBCUTANEOUS

## 2016-09-18 MED ORDER — INSULIN GLARGINE 100 UNIT/ML ~~LOC~~ SOLN
35.0000 [IU] | Freq: Every day | SUBCUTANEOUS | Status: DC
  Administered 2016-09-18: 35 [IU] via SUBCUTANEOUS
  Filled 2016-09-18 (×2): qty 0.35

## 2016-09-18 MED ORDER — ASPIRIN 81 MG PO CHEW
162.0000 mg | CHEWABLE_TABLET | Freq: Once | ORAL | Status: AC
  Administered 2016-09-18: 162 mg via ORAL

## 2016-09-18 NOTE — Progress Notes (Signed)
Noted that blood sugars today have been 156-139 mg/dl.  Had received Lantus 25 units last night. Fasting lab blood sugar this morning was 156 mg/dl. Patient was admitted to hospital with blood sugars less than 70 mg/dl. Spoke with staff RN regarding patient's blood sugars and insulin dosages.   Recommend continuing Lantus 25 units every HS, discontinuing new order for Lantus 35 units every HS, and discontinuing Novolog 5 units TID meal coverage. Continue Novolog SENSITIVE correction scale TID & HS. Will continue to monitor blood sugars while in the hospital. Harvel Ricks RN BSN CDE

## 2016-09-18 NOTE — Progress Notes (Signed)
Pt temp 101.6 tylenol given, MD  Notified, will continue to monitor closely.  Edward Qualia RN

## 2016-09-18 NOTE — Progress Notes (Signed)
PROGRESS NOTE                                                                                                                                                                                                             Patient Demographics:    [name] [name], is a 65 y.o. female, DOB - 22-Nov-1951, QPR:[id number]  Admit date - 09/16/2016   Admitting Physician Waldemar Dickens, MD  Outpatient Primary MD for the patient is Rogers Blocker, MD  LOS - 1  Chief Complaint  Patient presents with  . Shortness of Breath       Brief Narrative    [name] [name] is a 65 y.o. 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 Initially intermittent but now fairly constant. Worse with exertion. Worse while lying flat. Denies any chest pain palpitations, fever, cough, dysuria, frequency, back pain, neck stiffness, headache, LOC, dizziness, vertigo. Associated with lower extremity swelling bilaterally and weight gain. No change medications. Denies any melena, hematochezia, bright red blood per rectum, hematemesis. Patient does not take home iron as it is on her medication list, last colonoscopy in April 2017 which was reportedly normal.   Subjective:    [name] [name] today has, No headache, No chest pain, No abdominal pain - No Nausea, No new weakness tingling or numbness, No Cough - SOB.     Assessment  & Plan :     1. Acute on chronic combined systolic and diastolic CHF. EF 40%. Exacerbated by acute on chronic symptomatically microcytic anemia - we'll transfuse, continue IV Lasix, already much improved, monitor intake and output and daily weights.  2. Recurrent microcytic iron deficiency anemia. Has had workup with Dr. Carlean Purl recently in April with  EGD and colonoscopy, no frank source, we'll place her on PPI, may require small bowel evaluation in the outpatient setting, transfused 3 units of packed RBCs this admission, place on oral iron, follow with Dr. Carlean Purl.  3. Hypertension. On beta blocker.  4. Morbid Obesity. Follow with PCP for weight loss.  5. CK D stage IV. Baseline creatinine close to 2.4. Close to baseline.   6. History of CVA with some left-sided mild deficits. Continue  aspirin and statin for secondary prevention.   7. Anxiety depression. Stable on albuterol.   8. Diabetic peripheral neuropathy. On Neurontin.   9. Fever at night. Chest x-ray UA nonacute, likely due to mild atelectasis, I are sided, requested to sit up in the chair, we will continue to monitor closely. He is nontoxic.   10. DM type II. On Lantus and sliding scale. Will add pre-meal NovoLog and increase Lantus.  CBG (last 3)   Recent Labs  09/17/16 1100 09/17/16 1614 09/17/16 2159  GLUCAP 159* 282* 306*      Family Communication  :  none  Code Status :  Full  Diet : Heart Healthy  Disposition Plan  :  Home in am  Consults  :  None  Procedures  :  None  DVT Prophylaxis  :   SCDs   Lab Results  Component Value Date   PLT 513 (H) 09/17/2016    Inpatient Medications  Scheduled Meds: . aspirin  325 mg Oral Daily  . brinzolamide  1 drop Both Eyes TID  . buPROPion  150 mg Oral BID  . ferrous sulfate  325 mg Oral BID WC  . gabapentin  300 mg Oral QHS  . heparin  5,000 Units Subcutaneous Q8H  . Influenza vac split quadrivalent PF  0.5 mL Intramuscular Tomorrow-1000  . insulin aspart  0-5 Units Subcutaneous QHS  . insulin aspart  0-9 Units Subcutaneous TID WC  . insulin glargine  25 Units Subcutaneous QHS  . latanoprost  1 drop Both Eyes QHS  . loratadine  10 mg Oral Daily  . metoprolol tartrate  12.5 mg Oral BID  . mometasone-formoterol  2 puff Inhalation BID  . pantoprazole  40 mg Oral Daily  . pravastatin  40 mg Oral QHS    . sodium chloride flush  3 mL Intravenous Q12H  . traMADol  50 mg Oral TID   Continuous Infusions: . sodium chloride 20 mL/hr at 09/16/16 0859   PRN Meds:.acetaminophen **OR** acetaminophen, hydrALAZINE, ondansetron **OR** ondansetron (ZOFRAN) IV  Antibiotics  :    Anti-infectives    None         Objective:   Vitals:   09/17/16 2257 09/18/16 0030 09/18/16 0627 09/18/16 0934  BP:   (!) 145/51 125/63  Pulse:   70 72  Resp:   (!) 23 (!) 22  Temp: (!) 102 F (38.9 C) 99.7 F (37.6 C) 98.6 F (37 C)   TempSrc: Oral Oral Oral   SpO2:   97% 98%  Weight:   134.3 kg (296 lb 1.6 oz)   Height:        Wt Readings from Last 3 Encounters:  09/18/16 134.3 kg (296 lb 1.6 oz)  04/29/16 126.6 kg (279 lb 3.2 oz)  04/21/16 125 kg (275 lb 9.6 oz)     Intake/Output Summary (Last 24 hours) at 09/18/16 1041 Last data filed at 09/18/16 0400  Gross per 24 hour  Intake             1125 ml  Output             1650 ml  Net             -525 ml     Physical Exam  Awake Alert, Oriented X 3, No new F.N deficits, Normal affect [place].AT,PERRAL Supple Neck,No JVD, No cervical lymphadenopathy appriciated.  Symmetrical Chest wall movement, Good air movement bilaterally, CTAB RRR,No Gallops,Rubs or new Murmurs, No Parasternal Heave +ve  B.Sounds, Abd Soft, No tenderness, No organomegaly appriciated, No rebound - guarding or rigidity. No Cyanosis, Clubbing or edema, No new Rash or bruise       Data Review:    CBC  Recent Labs Lab 09/16/16 0819 09/17/16 0012 09/17/16 0508 09/17/16 2320 09/18/16 0703  WBC 9.6  --  9.0 11.7*  --   HGB 5.3* 6.5* 6.3* 7.3* 7.6*  HCT 20.5* 24.1* 22.9* 25.0* 26.4*  PLT 588*  --  508* 513*  --   MCV 66.8*  --  70.9* 72.3*  --   MCH 17.3*  --  19.5* 21.1*  --   MCHC 25.9*  --  27.5* 29.2*  --   RDW 19.8*  --  21.6* 23.7*  --   LYMPHSABS  --   --  1.7 1.1  --   MONOABS  --   --  1.0 0.8  --   EOSABS  --   --  0.1 0.1  --   BASOSABS  --   --  0.0 0.0   --     Chemistries   Recent Labs Lab 09/16/16 0819 09/17/16 0508 09/17/16 2320 09/18/16 0703  NA 139 139 138 139  K 3.1* 3.6 4.0 3.6  CL 105 106 104 105  CO2 24 26 23 23   GLUCOSE 64* 87 240* 156*  BUN 40* 41* 38* 38*  CREATININE 2.32* 2.37* 2.41* 2.47*  CALCIUM 8.4* 8.5* 8.6* 8.6*  AST  --  15  --   --   ALT  --  8*  --   --   ALKPHOS  --  98  --   --   BILITOT  --  0.7  --   --    ------------------------------------------------------------------------------------------------------------------ No results for input(s): CHOL, HDL, LDLCALC, TRIG, CHOLHDL, LDLDIRECT in the last 72 hours.  Lab Results  Component Value Date   HGBA1C 5.9 (H) 09/16/2016   ------------------------------------------------------------------------------------------------------------------ No results for input(s): TSH, T4TOTAL, T3FREE, THYROIDAB in the last 72 hours.  Invalid input(s): FREET3 ------------------------------------------------------------------------------------------------------------------  Recent Labs  09/16/16 0946  VITAMINB12 349  FOLATE 8.4  FERRITIN 7*  TIBC 346  IRON 11*  RETICCTPCT 2.7    Coagulation profile No results for input(s): INR, PROTIME in the last 168 hours.  No results for input(s): DDIMER in the last 72 hours.  Cardiac Enzymes No results for input(s): CKMB, TROPONINI, MYOGLOBIN in the last 168 hours.  Invalid input(s): CK ------------------------------------------------------------------------------------------------------------------    Component Value Date/Time   BNP 450.4 (H) 09/16/2016 2458    Micro Results No results found for this or any previous visit (from the past 240 hour(s)).  Radiology Reports Dg Chest 2 View  Result Date: 09/18/2016 CLINICAL DATA:  Patient with shortness of breath. History of congestive heart failure. EXAM: CHEST  2 VIEW COMPARISON:  Chest radiograph 09/17/2016. FINDINGS: Stable cardiomegaly status post median  sternotomy and CABG procedure. Pulmonary vascular redistribution. Mild bilateral perihilar interstitial opacities. Lung bases are excluded from view on lateral view. No pneumothorax. IMPRESSION: Cardiomegaly with mild interstitial edema. Electronically Signed   By: Lovey Newcomer M.D.   On: 09/18/2016 09:26   Dg Chest Port 1 View  Result Date: 09/17/2016 CLINICAL DATA:  Fever and shortness of breath. EXAM: PORTABLE CHEST 1 VIEW COMPARISON:  09/16/2016 FINDINGS: Postoperative changes in the mediastinum. Cardiac enlargement with mild pulmonary vascular congestion. Hazy interstitial opacities in the mid lungs likely representing early interstitial edema. No blunting of costophrenic angles. No pneumothorax. No focal consolidation. IMPRESSION: Cardiac enlargement  with pulmonary vascular congestion and mild interstitial edema. Electronically Signed   By: Lucienne Capers M.D.   On: 09/17/2016 23:43   Dg Chest Port 1 View  Result Date: 09/16/2016 CLINICAL DATA:  Shortness of Breath EXAM: PORTABLE CHEST 1 VIEW COMPARISON:  April 17, 2016 FINDINGS: There is no edema or consolidation. Heart is borderline enlarged with pulmonary venous hypertension. Patient is status post coronary artery bypass grafting. No adenopathy. No bone lesions. IMPRESSION: Pulmonary vascular congestion without frank edema or consolidation. Stable cardiac silhouette. Electronically Signed   By: Lowella Grip III M.D.   On: 09/16/2016 08:07    Time Spent in minutes  30   SINGH,PRASHANT K M.D on 09/18/2016 at 10:41 AM  Between 7am to 7pm - Pager - [phone]  After 7pm go to [email or web address] - password [facility]  [facility] -  Office  [phone]

## 2016-09-18 NOTE — Progress Notes (Signed)
Pt temp 102.8, MD notified, order for ASA once received and given.  Edward Qualia RN

## 2016-09-18 NOTE — Progress Notes (Signed)
The client has a temperature of 103.1 fahrenheit orally. I notified Baltazar Najjar with with [facility] and new orders were placed. I will continue to monitor the client closely.   Saddie Benders RN  Addendum:  Pershing Cox with an elevated lactic acid of 2.1. I will continue to monitor the client closely.   Saddie Benders RN

## 2016-09-19 DIAGNOSIS — E118 Type 2 diabetes mellitus with unspecified complications: Secondary | ICD-10-CM

## 2016-09-19 LAB — COMPREHENSIVE METABOLIC PANEL
ALBUMIN: 2.7 g/dL — AB (ref 3.5–5.0)
ALT: 10 U/L — ABNORMAL LOW (ref 14–54)
ANION GAP: 11 (ref 5–15)
AST: 15 U/L (ref 15–41)
Alkaline Phosphatase: 93 U/L (ref 38–126)
BUN: 39 mg/dL — ABNORMAL HIGH (ref 6–20)
CALCIUM: 8.5 mg/dL — AB (ref 8.9–10.3)
CHLORIDE: 100 mmol/L — AB (ref 101–111)
CO2: 24 mmol/L (ref 22–32)
Creatinine, Ser: 2.52 mg/dL — ABNORMAL HIGH (ref 0.44–1.00)
GFR calc non Af Amer: 19 mL/min — ABNORMAL LOW (ref >60)
GFR, EST AFRICAN AMERICAN: 22 mL/min — AB (ref >60)
GLUCOSE: 72 mg/dL (ref 65–99)
POTASSIUM: 3.8 mmol/L (ref 3.5–5.1)
SODIUM: 135 mmol/L (ref 135–145)
Total Bilirubin: 0.6 mg/dL (ref 0.3–1.2)
Total Protein: 6.5 g/dL (ref 6.5–8.1)

## 2016-09-19 LAB — CBC WITH DIFFERENTIAL/PLATELET
BASOS ABS: 0 10*3/uL (ref 0.0–0.1)
BASOS PCT: 0 %
EOS ABS: 0.3 10*3/uL (ref 0.0–0.7)
Eosinophils Relative: 2 %
HCT: 26.6 % — ABNORMAL LOW (ref 36.0–46.0)
Hemoglobin: 7.5 g/dL — ABNORMAL LOW (ref 12.0–15.0)
Lymphocytes Relative: 10 %
Lymphs Abs: 1.3 10*3/uL (ref 0.7–4.0)
MCH: 20.8 pg — AB (ref 26.0–34.0)
MCHC: 28.2 g/dL — AB (ref 30.0–36.0)
MCV: 73.9 fL — ABNORMAL LOW (ref 78.0–100.0)
MONO ABS: 1.3 10*3/uL — AB (ref 0.1–1.0)
Monocytes Relative: 10 %
NEUTROS ABS: 9.9 10*3/uL — AB (ref 1.7–7.7)
NEUTROS PCT: 78 %
PLATELETS: 507 10*3/uL — AB (ref 150–400)
RBC: 3.6 MIL/uL — ABNORMAL LOW (ref 3.87–5.11)
RDW: 24.8 % — ABNORMAL HIGH (ref 11.5–15.5)
WBC: 12.8 10*3/uL — ABNORMAL HIGH (ref 4.0–10.5)

## 2016-09-19 LAB — HEMOGLOBIN AND HEMATOCRIT, BLOOD
HEMATOCRIT: 24.5 % — AB (ref 36.0–46.0)
HEMATOCRIT: 25.1 % — AB (ref 36.0–46.0)
HEMOGLOBIN: 7 g/dL — AB (ref 12.0–15.0)
Hemoglobin: 7.1 g/dL — ABNORMAL LOW (ref 12.0–15.0)

## 2016-09-19 LAB — URINE CULTURE

## 2016-09-19 LAB — OCCULT BLOOD X 1 CARD TO LAB, STOOL: FECAL OCCULT BLD: POSITIVE — AB

## 2016-09-19 LAB — GLUCOSE, CAPILLARY
GLUCOSE-CAPILLARY: 123 mg/dL — AB (ref 65–99)
GLUCOSE-CAPILLARY: 156 mg/dL — AB (ref 65–99)
Glucose-Capillary: 70 mg/dL (ref 65–99)
Glucose-Capillary: 174 mg/dL — ABNORMAL HIGH (ref 65–99)

## 2016-09-19 LAB — SEDIMENTATION RATE: SED RATE: 90 mm/h — AB (ref 0–22)

## 2016-09-19 LAB — C-REACTIVE PROTEIN: CRP: 15.2 mg/dL — AB (ref <1.0)

## 2016-09-19 LAB — LACTATE DEHYDROGENASE: LDH: 253 U/L — AB (ref 98–192)

## 2016-09-19 MED ORDER — INSULIN GLARGINE 100 UNIT/ML ~~LOC~~ SOLN
30.0000 [IU] | Freq: Every day | SUBCUTANEOUS | Status: DC
Start: 2016-09-19 — End: 2016-09-23
  Administered 2016-09-19 – 2016-09-22 (×4): 30 [IU] via SUBCUTANEOUS
  Filled 2016-09-19 (×5): qty 0.3

## 2016-09-19 NOTE — Progress Notes (Signed)
PROGRESS NOTE                                                                                                                                                                                                             Patient Demographics:    [name] [name], is a 65 y.o. female, DOB - 02/01/1951, ZGY:[id number]  Admit date - 09/16/2016   Admitting Physician Waldemar Dickens, MD  Outpatient Primary MD for the patient is Rogers Blocker, MD  LOS - 2  Chief Complaint  Patient presents with  . Shortness of Breath       Brief Narrative    [name] [name] is a 65 y.o. 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 Initially intermittent but now fairly constant. Worse with exertion. Worse while lying flat. Denies any chest pain palpitations, fever, cough, dysuria, frequency, back pain, neck stiffness, headache, LOC, dizziness, vertigo. Associated with lower extremity swelling bilaterally and weight gain. No change medications. Denies any melena, hematochezia, bright red blood per rectum, hematemesis. Patient does not take home iron as it is on her medication list, last colonoscopy in April 2017 which was reportedly normal.   Subjective:    [name] [name] today has, No headache, No chest pain, No abdominal pain - No Nausea, No new weakness tingling or numbness, No Cough - SOB.     Assessment  & Plan :     1. Acute on chronic combined systolic and diastolic CHF. EF 40%. Exacerbated by acute on chronic symptomatically microcytic anemia - we'll transfuse, continue IV Lasix, already much improved, monitor intake and output and daily weights.  2. Recurrent microcytic iron deficiency  anemia. Has had workup with Dr. Carlean Purl recently in April with EGD and colonoscopy, no frank source, we'll place her on PPI, may require small bowel evaluation in the outpatient setting, transfused 3 units of packed RBCs this admission, placed on oral iron, follow with Dr. Carlean Purl. Check LDH-Haptoglobin & smear  3. Hypertension. On beta blocker.  4. Morbid Obesity. Follow with PCP for weight loss.  5. CK D stage IV. Baseline creatinine close to 2.4. Close to baseline.   6. History of CVA with some  left-sided mild deficits. Continue aspirin and statin for secondary prevention.   7. Anxiety depression. Stable on albuterol.   8. Diabetic peripheral neuropathy. On Neurontin.   9. Recurrent Fevers. Chest x-ray UA nonacute, have added IS & requested to sit up in the chair, Blood Cultures pending, CT Abd-pelvis non acute, check ESR-CRP-LDH-Haptoglobin & smear.   10. DM type II. On Lantus and sliding scale. Will add pre-meal NovoLog and increase Lantus.  CBG (last 3)   Recent Labs  09/18/16 1632 09/18/16 2111 09/19/16 0753  GLUCAP 135* 280* 33      Family Communication  :  none  Code Status :  Full  Diet : Heart Healthy  Disposition Plan  :  Home in am  Consults  :  None  Procedures  :  None  DVT Prophylaxis  :   SCDs   Lab Results  Component Value Date   PLT 507 (H) 09/19/2016    Inpatient Medications  Scheduled Meds: . aspirin  325 mg Oral Daily  . brinzolamide  1 drop Both Eyes TID  . buPROPion  150 mg Oral BID  . ferrous sulfate  325 mg Oral BID WC  . gabapentin  300 mg Oral QHS  . heparin  5,000 Units Subcutaneous Q8H  . Influenza vac split quadrivalent PF  0.5 mL Intramuscular Tomorrow-1000  . insulin aspart  0-5 Units Subcutaneous QHS  . insulin aspart  0-9 Units Subcutaneous TID WC  . insulin aspart  5 Units Subcutaneous TID WC  . insulin glargine  35 Units Subcutaneous QHS  . latanoprost  1 drop Both Eyes QHS  . loratadine  10 mg Oral Daily  .  metoprolol tartrate  12.5 mg Oral BID  . mometasone-formoterol  2 puff Inhalation BID  . pantoprazole  40 mg Oral Daily  . pravastatin  40 mg Oral QHS  . sodium chloride flush  3 mL Intravenous Q12H  . traMADol  50 mg Oral TID   Continuous Infusions: . sodium chloride 20 mL/hr at 09/16/16 0859   PRN Meds:.acetaminophen **OR** acetaminophen, hydrALAZINE, ondansetron **OR** ondansetron (ZOFRAN) IV  Antibiotics  :    Anti-infectives    None         Objective:   Vitals:   09/18/16 2019 09/18/16 2114 09/19/16 0533 09/19/16 0819  BP:  102/64 135/85 132/86  Pulse:  85 72 89  Resp:    17  Temp:  99.3 F (37.4 C) 99.2 F (37.3 C) (!) 100.7 F (38.2 C)  TempSrc:  Oral Oral Oral  SpO2: 95% 98% 100% 100%  Weight:   134.9 kg (297 lb 8 oz)   Height:        Wt Readings from Last 3 Encounters:  09/19/16 134.9 kg (297 lb 8 oz)  04/29/16 126.6 kg (279 lb 3.2 oz)  04/21/16 125 kg (275 lb 9.6 oz)     Intake/Output Summary (Last 24 hours) at 09/19/16 0939 Last data filed at 09/19/16 0858  Gross per 24 hour  Intake              483 ml  Output              450 ml  Net               33 ml     Physical Exam  Awake Alert, Oriented X 3, No new F.N deficits, Normal affect [place].AT,PERRAL Supple Neck,No JVD, No cervical lymphadenopathy appriciated.  Symmetrical Chest wall movement, Good air movement  bilaterally, CTAB RRR,No Gallops,Rubs or new Murmurs, No Parasternal Heave +ve B.Sounds, Abd Soft, No tenderness, No organomegaly appriciated, No rebound - guarding or rigidity. No Cyanosis, Clubbing or edema, No new Rash or bruise       Data Review:    CBC  Recent Labs Lab 09/16/16 0819  09/17/16 0508 09/17/16 2320 09/18/16 0703 09/19/16 0029 09/19/16 0612 09/19/16 0759  WBC 9.6  --  9.0 11.7*  --   --   --  12.8*  HGB 5.3*  < > 6.3* 7.3* 7.6* 7.1* 7.0* 7.5*  HCT 20.5*  < > 22.9* 25.0* 26.4* 25.1* 24.5* 26.6*  PLT 588*  --  508* 513*  --   --   --  507*  MCV 66.8*  --   70.9* 72.3*  --   --   --  73.9*  MCH 17.3*  --  19.5* 21.1*  --   --   --  20.8*  MCHC 25.9*  --  27.5* 29.2*  --   --   --  28.2*  RDW 19.8*  --  21.6* 23.7*  --   --   --  24.8*  LYMPHSABS  --   --  1.7 1.1  --   --   --  1.3  MONOABS  --   --  1.0 0.8  --   --   --  1.3*  EOSABS  --   --  0.1 0.1  --   --   --  0.3  BASOSABS  --   --  0.0 0.0  --   --   --  0.0  < > = values in this interval not displayed.  Chemistries   Recent Labs Lab 09/16/16 0819 09/17/16 0508 09/17/16 2320 09/18/16 0703 09/19/16 0759  NA 139 139 138 139 135  K 3.1* 3.6 4.0 3.6 3.8  CL 105 106 104 105 100*  CO2 '24 26 23 23 24  ' GLUCOSE 64* 87 240* 156* 72  BUN 40* 41* 38* 38* 39*  CREATININE 2.32* 2.37* 2.41* 2.47* 2.52*  CALCIUM 8.4* 8.5* 8.6* 8.6* 8.5*  AST  --  15  --   --  15  ALT  --  8*  --   --  10*  ALKPHOS  --  98  --   --  93  BILITOT  --  0.7  --   --  0.6   ------------------------------------------------------------------------------------------------------------------ No results for input(s): CHOL, HDL, LDLCALC, TRIG, CHOLHDL, LDLDIRECT in the last 72 hours.  Lab Results  Component Value Date   HGBA1C 5.9 (H) 09/16/2016   ------------------------------------------------------------------------------------------------------------------ No results for input(s): TSH, T4TOTAL, T3FREE, THYROIDAB in the last 72 hours.  Invalid input(s): FREET3 ------------------------------------------------------------------------------------------------------------------  Recent Labs  09/16/16 0946  VITAMINB12 349  FOLATE 8.4  FERRITIN 7*  TIBC 346  IRON 11*  RETICCTPCT 2.7    Coagulation profile No results for input(s): INR, PROTIME in the last 168 hours.  No results for input(s): DDIMER in the last 72 hours.  Cardiac Enzymes No results for input(s): CKMB, TROPONINI, MYOGLOBIN in the last 168 hours.  Invalid input(s):  CK ------------------------------------------------------------------------------------------------------------------    Component Value Date/Time   BNP 450.4 (H) 09/16/2016 1572    Micro Results Recent Results (from the past 240 hour(s))  Urine culture     Status: Abnormal   Collection Time: 09/17/16  6:45 AM  Result Value Ref Range Status   Specimen Description URINE, RANDOM  Final   Special Requests NONE  Final   Culture MULTIPLE SPECIES PRESENT, SUGGEST RECOLLECTION (A)  Final   Report Status 09/19/2016 FINAL  Final    Radiology Reports Ct Abdomen Pelvis Wo Contrast  Result Date: 09/19/2016 CLINICAL DATA:  Fever.  History of diabetes. EXAM: CT ABDOMEN AND PELVIS WITHOUT CONTRAST TECHNIQUE: Multidetector CT imaging of the abdomen and pelvis was performed following the standard protocol without IV contrast. COMPARISON:  None. FINDINGS: Lower chest: Motion artifact limits evaluation of the lung bases but no focal consolidation noted. Small esophageal hiatal hernia. Hepatobiliary: No focal liver abnormality is seen. No gallstones, gallbladder wall thickening, or biliary dilatation. Pancreas: Unremarkable. No pancreatic ductal dilatation or surrounding inflammatory changes. Spleen: Normal in size without focal abnormality. Adrenals/Urinary Tract: Small stones in the kidneys without evidence of obstruction. Vascular calcifications also seen in the kidneys. No hydronephrosis or hydroureter. Bladder wall is not thickened. Stomach/Bowel: Stomach is within normal limits. Appendix appears normal. No evidence of bowel wall thickening, distention, or inflammatory changes. Vascular/Lymphatic: Calcific aortic atherosclerosis with mild aortic ectasia. Maximal AP diameter is 2.9 cm. Bilateral iliac artery aneurysms measuring 1.8 cm on the right and 1.7 cm on the left. No retroperitoneal hematoma. Scattered lymph nodes in the upper abdomen are not pathologically enlarged and are likely reactive. Reproductive:  Uterus and bilateral adnexa are unremarkable. Other: No abdominal wall hernia or abnormality. No abdominopelvic ascites. Musculoskeletal: Degenerative changes in the spine. No destructive bone lesions. Note the examination is technically limited due to motion artifact. IMPRESSION: Small esophageal hiatal hernia. Tiny nonobstructing stones in the kidneys. Aortic atherosclerosis with ectatic aorta and bilateral iliac artery aneurysms. No acute process demonstrated. Electronically Signed   By: Lucienne Capers M.D.   On: 09/19/2016 02:55   Dg Chest 2 View  Result Date: 09/18/2016 CLINICAL DATA:  Patient with shortness of breath. History of congestive heart failure. EXAM: CHEST  2 VIEW COMPARISON:  Chest radiograph 09/17/2016. FINDINGS: Stable cardiomegaly status post median sternotomy and CABG procedure. Pulmonary vascular redistribution. Mild bilateral perihilar interstitial opacities. Lung bases are excluded from view on lateral view. No pneumothorax. IMPRESSION: Cardiomegaly with mild interstitial edema. Electronically Signed   By: Lovey Newcomer M.D.   On: 09/18/2016 09:26   Dg Chest Port 1 View  Result Date: 09/17/2016 CLINICAL DATA:  Fever and shortness of breath. EXAM: PORTABLE CHEST 1 VIEW COMPARISON:  09/16/2016 FINDINGS: Postoperative changes in the mediastinum. Cardiac enlargement with mild pulmonary vascular congestion. Hazy interstitial opacities in the mid lungs likely representing early interstitial edema. No blunting of costophrenic angles. No pneumothorax. No focal consolidation. IMPRESSION: Cardiac enlargement with pulmonary vascular congestion and mild interstitial edema. Electronically Signed   By: Lucienne Capers M.D.   On: 09/17/2016 23:43   Dg Chest Port 1 View  Result Date: 09/16/2016 CLINICAL DATA:  Shortness of Breath EXAM: PORTABLE CHEST 1 VIEW COMPARISON:  April 17, 2016 FINDINGS: There is no edema or consolidation. Heart is borderline enlarged with pulmonary venous hypertension.  Patient is status post coronary artery bypass grafting. No adenopathy. No bone lesions. IMPRESSION: Pulmonary vascular congestion without frank edema or consolidation. Stable cardiac silhouette. Electronically Signed   By: Lowella Grip III M.D.   On: 09/16/2016 08:07    Time Spent in minutes  30   SINGH,PRASHANT K M.D on 09/19/2016 at 9:39 AM  Between 7am to 7pm - Pager - [phone]  After 7pm go to [email or web address] - password [facility]  [facility] -  Office  [phone]

## 2016-09-19 NOTE — Progress Notes (Addendum)
[name] Hbg went from 7.6 to 7.1 for her after midnight lab work. I notified Baltazar Najjar with [facility]. While waiting for a call back the client had a very large BM. I did not visually see blood but there was an odor of blood. Orders were place for a occult stool and Baltazar Najjar mentioned adding more labs to be drawn this morning. I will continue to monitor the the client closely.   Saddie Benders RN  Addendum:  The occult stool came back positive for blood. I notified Baltazar Najjar with results. Waiting for a call back or orders to be placed. I will continue to monitor the client closely.  Saddie Benders RN

## 2016-09-20 ENCOUNTER — Encounter (HOSPITAL_COMMUNITY): Payer: Medicare Other

## 2016-09-20 ENCOUNTER — Inpatient Hospital Stay (HOSPITAL_COMMUNITY): Payer: Medicare Other

## 2016-09-20 LAB — GLUCOSE, CAPILLARY
GLUCOSE-CAPILLARY: 92 mg/dL (ref 65–99)
GLUCOSE-CAPILLARY: 157 mg/dL — AB (ref 65–99)
GLUCOSE-CAPILLARY: 156 mg/dL — AB (ref 65–99)
Glucose-Capillary: 148 mg/dL — ABNORMAL HIGH (ref 65–99)

## 2016-09-20 LAB — BASIC METABOLIC PANEL
Anion gap: 10 (ref 5–15)
BUN: 44 mg/dL — AB (ref 6–20)
CALCIUM: 8.5 mg/dL — AB (ref 8.9–10.3)
CHLORIDE: 104 mmol/L (ref 101–111)
CO2: 23 mmol/L (ref 22–32)
CREATININE: 2.59 mg/dL — AB (ref 0.44–1.00)
GFR calc Af Amer: 21 mL/min — ABNORMAL LOW (ref >60)
GFR calc non Af Amer: 18 mL/min — ABNORMAL LOW (ref >60)
Glucose, Bld: 145 mg/dL — ABNORMAL HIGH (ref 65–99)
Potassium: 4.4 mmol/L (ref 3.5–5.1)
SODIUM: 137 mmol/L (ref 135–145)

## 2016-09-20 LAB — CBC
HCT: 24.1 % — ABNORMAL LOW (ref 36.0–46.0)
HEMOGLOBIN: 6.9 g/dL — AB (ref 12.0–15.0)
MCH: 21.2 pg — AB (ref 26.0–34.0)
MCHC: 28.6 g/dL — AB (ref 30.0–36.0)
MCV: 74.2 fL — ABNORMAL LOW (ref 78.0–100.0)
Platelets: 457 10*3/uL — ABNORMAL HIGH (ref 150–400)
RBC: 3.25 MIL/uL — ABNORMAL LOW (ref 3.87–5.11)
RDW: 26.3 % — AB (ref 11.5–15.5)
WBC: 10.3 10*3/uL (ref 4.0–10.5)

## 2016-09-20 LAB — HAPTOGLOBIN: HAPTOGLOBIN: 322 mg/dL — AB (ref 34–200)

## 2016-09-20 MED ORDER — MORPHINE SULFATE (PF) 2 MG/ML IV SOLN
1.0000 mg | Freq: Once | INTRAVENOUS | Status: AC
  Administered 2016-09-20: 1 mg via INTRAVENOUS
  Filled 2016-09-20: qty 1

## 2016-09-20 MED ORDER — LORAZEPAM 2 MG/ML IJ SOLN: 1.0000 mg | Freq: Once | INTRAMUSCULAR | Status: DC

## 2016-09-20 NOTE — Progress Notes (Signed)
Paged Both night coverage person Donnal Debar, NP and Dr. Candiss Norse regards to Pt's Hgb 6.9. Awaiting orders.

## 2016-09-20 NOTE — Progress Notes (Signed)
PROGRESS NOTE                                                                                                                                                                                                             Patient Demographics:    [name] [name], is a 65 y.o. female, DOB - 1951/07/14, RKY:[id number]  Admit date - 09/16/2016   Admitting Physician Waldemar Dickens, MD  Outpatient Primary MD for the patient is Rogers Blocker, MD  LOS - 3  Chief Complaint  Patient presents with  . Shortness of Breath       Brief Narrative    [name] [name] is a 65 y.o. 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 Initially intermittent but now fairly constant. Worse with exertion. Worse while lying flat. Denies any chest pain palpitations, fever, cough, dysuria, frequency, back pain, neck stiffness, headache, LOC, dizziness, vertigo. Associated with lower extremity swelling bilaterally and weight gain. No change medications. Denies any melena, hematochezia, bright red blood per rectum, hematemesis. Patient does not take home iron as it is on her medication list, last colonoscopy in April 2017 which was reportedly normal.   Subjective:    [name] [name] today has, No headache, No chest pain, No abdominal pain - No Nausea, No new weakness tingling or numbness, No Cough - SOB.  She overall feels better today.   Assessment  & Plan :     1. Acute on chronic combined systolic and diastolic CHF. EF 40%. Exacerbated by acute on chronic symptomatically microcytic anemia - we'll transfuse, continue IV Lasix, already much improved, monitor intake and output and daily weights.  2.  Recurrent microcytic iron deficiency anemia. Has had workup with Dr. Carlean Purl recently in April with EGD and colonoscopy, no frank source, we'll place her on PPI, may require small bowel evaluation in the outpatient setting, transfused 2 units of packed RBCs this admission, hemoglobin borderline 6.9 on 09/20/2016, we'll repeat in the morning if required may require another unit of transfusion, placed on oral iron, follow with Dr. Carlean Purl. Haptoglobin is high which goes against hemolysis, LDH mildly high, peripheral smear pending.  3. Hypertension. On beta blocker.  4. Morbid Obesity. Follow with PCP for weight loss.  5. CK D stage IV. Baseline creatinine close to 2.4. Close to baseline.   6. History of CVA with some left-sided mild deficits. Continue aspirin and statin for secondary prevention.   7. Anxiety depression. Stable on albuterol.   8. Diabetic peripheral neuropathy. On Neurontin.   9. Recurrent Fevers. Chest x-ray UA nonacute, have added IS & requested to sit up in the chair, Blood Cultures pending but thus far negative, CT Abd-pelvis non acute, check has high ESR-CRP and LDH, only possible source is chronic low back pain ongoing for one year, since this is the only possible source will check MRI of T and L-spine. We will also await peripheral smear to look for any hematological abnormality due to ongoing anemia.  10. DM type II. Continue on present dose Lantus and sliding scale.  CBG (last 3)   Recent Labs  09/19/16 1700 09/19/16 2040 09/20/16 0807  GLUCAP 156* 174* 92      Family Communication  :  none  Code Status :  Full  Diet : Heart Healthy  Disposition Plan  :  Home in am  Consults  :  None  Procedures  :  None  DVT Prophylaxis  :   SCDs   Lab Results  Component Value Date   PLT 457 (H) 09/20/2016    Inpatient Medications  Scheduled Meds: . aspirin  325 mg Oral Daily  . brinzolamide  1 drop Both Eyes TID  . buPROPion  150 mg Oral BID  . ferrous  sulfate  325 mg Oral BID WC  . gabapentin  300 mg Oral QHS  . heparin  5,000 Units Subcutaneous Q8H  . Influenza vac split quadrivalent PF  0.5 mL Intramuscular Tomorrow-1000  . insulin aspart  0-5 Units Subcutaneous QHS  . insulin aspart  0-9 Units Subcutaneous TID WC  . insulin glargine  30 Units Subcutaneous QHS  . latanoprost  1 drop Both Eyes QHS  . loratadine  10 mg Oral Daily  . LORazepam  1 mg Intravenous Once  . metoprolol tartrate  12.5 mg Oral BID  . mometasone-formoterol  2 puff Inhalation BID  . pantoprazole  40 mg Oral Daily  . pravastatin  40 mg Oral QHS  . sodium chloride flush  3 mL Intravenous Q12H  . traMADol  50 mg Oral TID   Continuous Infusions: . sodium chloride 20 mL/hr at 09/16/16 0859   PRN Meds:.acetaminophen **OR** acetaminophen, hydrALAZINE, ondansetron **OR** ondansetron (ZOFRAN) IV  Antibiotics  :    Anti-infectives    None         Objective:   Vitals:   09/19/16 1951 09/19/16 2037 09/20/16 0558 09/20/16 0600  BP:  106/69 (!) 130/56 (!) 130/56  Pulse:  77 76 94  Resp:  17 15 (!) 24  Temp:  99.5 F (37.5 C) 100 F (37.8 C)   TempSrc:  Oral Oral   SpO2: 98% 100% 100% 100%  Weight:   135.1 kg (297 lb 14.4 oz)   Height:        Wt Readings from Last 3 Encounters:  09/20/16 135.1 kg (297 lb 14.4 oz)  04/29/16 126.6 kg (279 lb 3.2 oz)  04/21/16 125 kg (275 lb 9.6 oz)     Intake/Output Summary (Last 24 hours) at 09/20/16 0901 Last data filed at 09/19/16 1700  Gross per 24 hour  Intake                480 ml  Output             1300 ml  Net             -820 ml     Physical Exam  Awake Alert, Oriented X 3, No new F.N deficits, Normal affect [place].AT,PERRAL Supple Neck,No JVD, No cervical lymphadenopathy appriciated.  Symmetrical Chest wall movement, Good air movement bilaterally, CTAB RRR,No Gallops,Rubs or new Murmurs, No Parasternal Heave +ve B.Sounds, Abd Soft, No tenderness, No organomegaly appriciated, No rebound - guarding or  rigidity. No Cyanosis, Clubbing or edema, No new Rash or bruise       Data Review:    CBC  Recent Labs Lab 09/16/16 0819  09/17/16 0508 09/17/16 2320 09/18/16 0703 09/19/16 0029 09/19/16 0612 09/19/16 0759 09/20/16 0312  WBC 9.6  --  9.0 11.7*  --   --   --  12.8* 10.3  HGB 5.3*  < > 6.3* 7.3* 7.6* 7.1* 7.0* 7.5* 6.9*  HCT 20.5*  < > 22.9* 25.0* 26.4* 25.1* 24.5* 26.6* 24.1*  PLT 588*  --  508* 513*  --   --   --  507* 457*  MCV 66.8*  --  70.9* 72.3*  --   --   --  73.9* 74.2*  MCH 17.3*  --  19.5* 21.1*  --   --   --  20.8* 21.2*  MCHC 25.9*  --  27.5* 29.2*  --   --   --  28.2* 28.6*  RDW 19.8*  --  21.6* 23.7*  --   --   --  24.8* 26.3*  LYMPHSABS  --   --  1.7 1.1  --   --   --  1.3  --   MONOABS  --   --  1.0 0.8  --   --   --  1.3*  --   EOSABS  --   --  0.1 0.1  --   --   --  0.3  --   BASOSABS  --   --  0.0 0.0  --   --   --  0.0  --   < > = values in this interval not displayed.  Chemistries   Recent Labs Lab 09/17/16 0508 09/17/16 2320 09/18/16 0703 09/19/16 0759 09/20/16 0312  NA 139 138 139 135 137  K 3.6 4.0 3.6 3.8 4.4  CL 106 104 105 100* 104  CO2 _0 GLUCOSE 87 240* 156* 72 145*  BUN 41* 38* 38* 39* 44*  CREATININE 2.37* 2.41* 2.47* 2.52* 2.59*  CALCIUM 8.5* 8.6* 8.6* 8.5* 8.5*  AST 15  --   --  15  --   ALT 8*  --   --  10*  --   ALKPHOS 98  --   --  93  --   BILITOT 0.7  --   --  0.6  --    ------------------------------------------------------------------------------------------------------------------ No results for input(s): CHOL, HDL, LDLCALC, TRIG, CHOLHDL, LDLDIRECT in the last 72 hours.  Lab Results  Component Value Date   HGBA1C 5.9 (H) 09/16/2016   ------------------------------------------------------------------------------------------------------------------ No results for input(s): TSH, T4TOTAL, T3FREE, THYROIDAB in the last 72 hours.  Invalid input(s):  FREET3 ------------------------------------------------------------------------------------------------------------------ No results for input(s): VITAMINB12, FOLATE, FERRITIN, TIBC, IRON, RETICCTPCT in the last 72 hours.  Coagulation profile No results for input(s): INR, PROTIME in the last 168 hours.  No results for input(s): DDIMER in the last 72 hours.  Cardiac Enzymes  No results for input(s): CKMB, TROPONINI, MYOGLOBIN in the last 168 hours.  Invalid input(s): CK ------------------------------------------------------------------------------------------------------------------    Component Value Date/Time   BNP 450.4 (H) 09/16/2016 1761    Micro Results Recent Results (from the past 240 hour(s))  Urine culture     Status: Abnormal   Collection Time: 09/17/16  6:45 AM  Result Value Ref Range Status   Specimen Description URINE, RANDOM  Final   Special Requests NONE  Final   Culture MULTIPLE SPECIES PRESENT, SUGGEST RECOLLECTION (A)  Final   Report Status 09/19/2016 FINAL  Final  Culture, blood (routine x 2)     Status: None (Preliminary result)   Collection Time: 09/17/16 11:24 PM  Result Value Ref Range Status   Specimen Description RIGHT ANTECUBITAL  Final   Special Requests   Final    BOTTLES DRAWN AEROBIC AND ANAEROBIC RED [place]   Culture NO GROWTH 1 DAY  Final   Report Status PENDING  Incomplete  Culture, blood (routine x 2)     Status: None (Preliminary result)   Collection Time: 09/17/16 11:32 PM  Result Value Ref Range Status   Specimen Description BLOOD RIGHT HAND  Final   Special Requests BOTTLES DRAWN AEROBIC AND ANAEROBIC  10CC EACH  Final   Culture NO GROWTH 1 DAY  Final   Report Status PENDING  Incomplete    Radiology Reports Ct Abdomen Pelvis Wo Contrast  Result Date: 09/19/2016 CLINICAL DATA:  Fever.  History of diabetes. EXAM: CT ABDOMEN AND PELVIS WITHOUT CONTRAST TECHNIQUE: Multidetector CT imaging of the abdomen and pelvis was performed  following the standard protocol without IV contrast. COMPARISON:  None. FINDINGS: Lower chest: Motion artifact limits evaluation of the lung bases but no focal consolidation noted. Small esophageal hiatal hernia. Hepatobiliary: No focal liver abnormality is seen. No gallstones, gallbladder wall thickening, or biliary dilatation. Pancreas: Unremarkable. No pancreatic ductal dilatation or surrounding inflammatory changes. Spleen: Normal in size without focal abnormality. Adrenals/Urinary Tract: Small stones in the kidneys without evidence of obstruction. Vascular calcifications also seen in the kidneys. No hydronephrosis or hydroureter. Bladder wall is not thickened. Stomach/Bowel: Stomach is within normal limits. Appendix appears normal. No evidence of bowel wall thickening, distention, or inflammatory changes. Vascular/Lymphatic: Calcific aortic atherosclerosis with mild aortic ectasia. Maximal AP diameter is 2.9 cm. Bilateral iliac artery aneurysms measuring 1.8 cm on the right and 1.7 cm on the left. No retroperitoneal hematoma. Scattered lymph nodes in the upper abdomen are not pathologically enlarged and are likely reactive. Reproductive: Uterus and bilateral adnexa are unremarkable. Other: No abdominal wall hernia or abnormality. No abdominopelvic ascites. Musculoskeletal: Degenerative changes in the spine. No destructive bone lesions. Note the examination is technically limited due to motion artifact. IMPRESSION: Small esophageal hiatal hernia. Tiny nonobstructing stones in the kidneys. Aortic atherosclerosis with ectatic aorta and bilateral iliac artery aneurysms. No acute process demonstrated. Electronically Signed   By: Lucienne Capers M.D.   On: 09/19/2016 02:55   Dg Chest 2 View  Result Date: 09/18/2016 CLINICAL DATA:  Patient with shortness of breath. History of congestive heart failure. EXAM: CHEST  2 VIEW COMPARISON:  Chest radiograph 09/17/2016. FINDINGS: Stable cardiomegaly status post median  sternotomy and CABG procedure. Pulmonary vascular redistribution. Mild bilateral perihilar interstitial opacities. Lung bases are excluded from view on lateral view. No pneumothorax. IMPRESSION: Cardiomegaly with mild interstitial edema. Electronically Signed   By: Lovey Newcomer M.D.   On: 09/18/2016 09:26   Dg Chest [facility] 1 View  Result  Date: 09/17/2016 CLINICAL DATA:  Fever and shortness of breath. EXAM: PORTABLE CHEST 1 VIEW COMPARISON:  09/16/2016 FINDINGS: Postoperative changes in the mediastinum. Cardiac enlargement with mild pulmonary vascular congestion. Hazy interstitial opacities in the mid lungs likely representing early interstitial edema. No blunting of costophrenic angles. No pneumothorax. No focal consolidation. IMPRESSION: Cardiac enlargement with pulmonary vascular congestion and mild interstitial edema. Electronically Signed   By: William  Stevens M.D.   On: 09/17/2016 23:43   Dg Chest Port 1 View  Result Date: 09/16/2016 CLINICAL DATA:  Shortness of Breath EXAM: PORTABLE CHEST 1 VIEW COMPARISON:  April 17, 2016 FINDINGS: There is no edema or consolidation. Heart is borderline enlarged with pulmonary venous hypertension. Patient is status post coronary artery bypass grafting. No adenopathy. No bone lesions. IMPRESSION: Pulmonary vascular congestion without frank edema or consolidation. Stable cardiac silhouette. Electronically Signed   By: William  Woodruff III M.D.   On: 09/16/2016 08:07    Time Spent in minutes  30   SINGH,PRASHANT K M.D on 09/20/2016 at 9:01 AM  Between 7am to 7pm - Pager - [phone]  After 7pm go to [email or web address] - password [email or web address]  [facility] -  Office  [phone]      

## 2016-09-21 ENCOUNTER — Inpatient Hospital Stay (HOSPITAL_COMMUNITY): Payer: Medicare Other

## 2016-09-21 DIAGNOSIS — R509 Fever, unspecified: Secondary | ICD-10-CM

## 2016-09-21 DIAGNOSIS — D649 Anemia, unspecified: Secondary | ICD-10-CM

## 2016-09-21 DIAGNOSIS — R0602 Shortness of breath: Secondary | ICD-10-CM

## 2016-09-21 DIAGNOSIS — Z794 Long term (current) use of insulin: Secondary | ICD-10-CM

## 2016-09-21 DIAGNOSIS — N183 Chronic kidney disease, stage 3 (moderate): Secondary | ICD-10-CM

## 2016-09-21 DIAGNOSIS — F17200 Nicotine dependence, unspecified, uncomplicated: Secondary | ICD-10-CM

## 2016-09-21 DIAGNOSIS — E1122 Type 2 diabetes mellitus with diabetic chronic kidney disease: Secondary | ICD-10-CM

## 2016-09-21 LAB — CBC
HCT: 25.7 % — ABNORMAL LOW (ref 36.0–46.0)
Hemoglobin: 7.2 g/dL — ABNORMAL LOW (ref 12.0–15.0)
MCH: 21.2 pg — AB (ref 26.0–34.0)
MCHC: 28 g/dL — AB (ref 30.0–36.0)
MCV: 75.6 fL — AB (ref 78.0–100.0)
PLATELETS: 510 10*3/uL — AB (ref 150–400)
RBC: 3.4 MIL/uL — ABNORMAL LOW (ref 3.87–5.11)
RDW: 27 % — ABNORMAL HIGH (ref 11.5–15.5)
WBC: 10.5 10*3/uL (ref 4.0–10.5)

## 2016-09-21 LAB — GLUCOSE, CAPILLARY
Glucose-Capillary: 91 mg/dL (ref 65–99)
Glucose-Capillary: 196 mg/dL — ABNORMAL HIGH (ref 65–99)
Glucose-Capillary: 323 mg/dL — ABNORMAL HIGH (ref 65–99)
Glucose-Capillary: 121 mg/dL — ABNORMAL HIGH (ref 65–99)

## 2016-09-21 NOTE — Consult Note (Signed)
[facility]  Date of Admission:  09/16/2016  Date of Consult:  09/21/2016  Reason for Consult: Fever Referring Physician: Candiss Norse  Impression/Recommendation Fever Anemia  She appears well, non-toxic.  She has BCx pending, ngtd. A UCx which is also ngtd.  Consider heme eval, bone marrow bx.  Check HIV, acute hepatitis panel.   CKD  DM2  Thank you so much for this interesting consult,   Bobby Rumpf (pager) [phone] [email or web address]  [name] [name] is an 65 y.o. female.  HPI: 65 yo F, hx of CKD, CHF, CAD, CVA, DM, polysubs abuse, she had colon in April 2017- which was normal. She was adm on 9-19 with 1-2 weeks of worsening SOB, DOE, increasing LE edema. She was found to have hgb 5.3 and had transfusions. Her WBC was normal. She had temp 101 in first 24h which has persisted. She states she has had fever for 2 weeks.  She has had a repeat drop in her hgb yesterday. Haptoglobin  322 and LDH 253.   CT abd no acute process, MRI spine no acute process.    Past Medical History:  Diagnosis Date  . Anemia 08/2016  . Arthritis of left shoulder region 03/23/2013  . Chronic combined systolic and diastolic CHF (congestive heart failure) (HCC)    a. EF 40-45%, mild LVH, mid apicalanteroseptal and apical HK.  . CKD (chronic kidney disease), stage III   . Cocaine abuse    crack cocaine heavily until 2008 then sporadic use since then  . Coronary artery disease    a. 06/2012 NSTEMI/CABG x 3 (LIMA->LAD, VG->OM2, VG->LCX);  b. 04/2015 MV: EF<30%, mid ant, apicalanterior, apical infarct;  c. 04/2015 Cath: LM nl, LAD 90p, LCX 46m OM1 min irregs, RCA mild dzs, LIMA->LAD nl w/ dist LAD dzs, VG->OM2 nl, VG->LCX nl-->Med Rx.  . CVA (cerebral infarction)    a. right internal capsule stroke in 12/2006  . Diabetes mellitus    diagnosed in 2008  . Essential hypertension   . Glaucoma   . Hyperlipidemia   . Left-sided sensory deficit present   . PVD [place]peripheral  vascular disease) [place]    a. 06/2012 ABI's: R - 0.73, L - 0.71.  .Marland KitchenShortness of breath dyspnea   . Thyroid nodule    FNA in 28270showed follicular cells but not definate neoplasm  . Tobacco abuse     Past Surgical History:  Procedure Laterality Date  . CARDIAC CATHETERIZATION    . CARDIAC CATHETERIZATION N/A 05/17/2015   Procedure: Left Heart Cath and Cors/Grafts Angiography;  Surgeon: MSherren Mocha MD;  [facility];  Service: Cardiovascular;  Laterality: N/A;  . COLONOSCOPY WITH PROPOFOL N/A 04/21/2016   Procedure: COLONOSCOPY WITH PROPOFOL;  Surgeon: JIrene Shipper MD;  [facility]  Service: Endoscopy;  Laterality: N/A;  . CORONARY ARTERY BYPASS GRAFT  07/09/2012   Procedure: CORONARY ARTERY BYPASS GRAFTING (CABG);  Surgeon: PIvin Poot MD;  [place] [facility]  Service: Open Heart Surgery;  Laterality: N/A;  . ESOPHAGOGASTRODUODENOSCOPY N/A 04/20/2016   Procedure: ESOPHAGOGASTRODUODENOSCOPY (EGD);  Surgeon: CGatha Mayer MD;  [facility];  Service: Endoscopy;  Laterality: N/A;  . LEFT HEART CATHETERIZATION WITH CORONARY ANGIOGRAM N/A 06/29/2012   Procedure: LEFT HEART CATHETERIZATION WITH CORONARY ANGIOGRAM;  Surgeon: Peter M JMartinique MD;  [facility];  Service: Cardiovascular;  Laterality: N/A;  . STERNAL WOUND DEBRIDEMENT  08/17/2012   Procedure: STERNAL WOUND DEBRIDEMENT;  Surgeon: PTharon Aquas  Kerby Less, MD;  [facility];  Service: Thoracic;  Laterality: N/A;  wound vac application  . STERNAL WOUND DEBRIDEMENT  08/24/2012   Procedure: STERNAL WOUND DEBRIDEMENT;  Surgeon: Ivin Poot, MD;  Location: [facility];  Service: Thoracic;  Laterality: N/A;  . STERNAL WOUND DEBRIDEMENT  09/01/2012   Procedure: STERNAL WOUND DEBRIDEMENT;  Surgeon: Ivin Poot, MD;  Location: [facility] [place];  Service: Thoracic;  Laterality: N/A;  . STERNAL WOUND DEBRIDEMENT  09/20/2012   Procedure: STERNAL WOUND DEBRIDEMENT;  Surgeon: Ivin Poot, MD;  Location: [facility];   Service: Thoracic;  Laterality: N/A;  wound vac change     Allergies  Allergen Reactions  . Naproxen Rash    Medications:  Scheduled: . aspirin  325 mg Oral Daily  . brinzolamide  1 drop Both Eyes TID  . buPROPion  150 mg Oral BID  . ferrous sulfate  325 mg Oral BID WC  . gabapentin  300 mg Oral QHS  . heparin  5,000 Units Subcutaneous Q8H  . insulin aspart  0-5 Units Subcutaneous QHS  . insulin aspart  0-9 Units Subcutaneous TID WC  . insulin glargine  30 Units Subcutaneous QHS  . latanoprost  1 drop Both Eyes QHS  . loratadine  10 mg Oral Daily  . LORazepam  1 mg Intravenous Once  . metoprolol tartrate  12.5 mg Oral BID  . mometasone-formoterol  2 puff Inhalation BID  . pantoprazole  40 mg Oral Daily  . pravastatin  40 mg Oral QHS  . sodium chloride flush  3 mL Intravenous Q12H  . traMADol  50 mg Oral TID    Abtx:  Anti-infectives    None      Total days of antibiotics: 0          Social History:  reports that she has been smoking.  She has a 12.50 pack-year smoking history. She has never used smokeless tobacco. She reports that she uses drugs, including Cocaine. She reports that she does not drink alcohol.  Family History  Problem Relation Age of Onset  . Diabetes Mother   . Hypertension Mother   . Hyperlipidemia Father   . Other      no known family CAD    General ROS: see HPI, no blood in urine or BM. no new medications.   Blood pressure (!) 142/64, pulse 82, temperature 99.4 F (37.4 C), resp. rate (!) 21, height '5\' 8"'  (1.727 m), weight 134.6 kg (296 lb 11.2 oz), SpO2 92 %. General appearance: alert, cooperative, no distress and morbidly obese Eyes: negative findings: conjunctivae and sclerae normal and pupils equal, round, reactive to light and accomodation Throat: normal findings: oropharynx pink & moist without lesions or evidence of thrush Lungs: clear to auscultation bilaterally Heart: regular rate and rhythm Abdomen: normal findings: bowel sounds  normal and soft, non-tender Extremities: edema nonpitting  Her feet are warm, there are no diabetic foot lesions.    Results for orders placed or performed during the hospital encounter of 09/16/16 (from the past 48 hour(s))  Glucose, capillary     Status: Abnormal   Collection Time: 09/19/16  5:00 PM  Result Value Ref Range   Glucose-Capillary 156 (H) 65 - 99 mg/dL   Comment 1 Document in Chart   Glucose, capillary     Status: Abnormal   Collection Time: 09/19/16  8:40 PM  Result Value Ref Range   Glucose-Capillary 174 (H) 65 - 99 mg/dL   Comment 1 Notify RN  CBC     Status: Abnormal   Collection Time: 09/20/16  3:12 AM  Result Value Ref Range   WBC 10.3 4.0 - 10.5 K/uL   RBC 3.25 (L) 3.87 - 5.11 MIL/uL   Hemoglobin 6.9 (LL) 12.0 - 15.0 g/dL    Comment: REPEATED TO VERIFY CRITICAL RESULT CALLED TO, READ BACK BY AND VERIFIED WITH: S.TEPE,RN [id number] 09/20/16 G.MCADOO    HCT 24.1 (L) 36.0 - 46.0 %   MCV 74.2 (L) 78.0 - 100.0 fL   MCH 21.2 (L) 26.0 - 34.0 pg   MCHC 28.6 (L) 30.0 - 36.0 g/dL   RDW 26.3 (H) 11.5 - 15.5 %   Platelets 457 (H) 150 - 400 K/uL  Basic metabolic panel     Status: Abnormal   Collection Time: 09/20/16  3:12 AM  Result Value Ref Range   Sodium 137 135 - 145 mmol/L   Potassium 4.4 3.5 - 5.1 mmol/L   Chloride 104 101 - 111 mmol/L   CO2 23 22 - 32 mmol/L   Glucose, Bld 145 (H) 65 - 99 mg/dL   BUN 44 (H) 6 - 20 mg/dL   Creatinine, Ser 2.59 (H) 0.44 - 1.00 mg/dL   Calcium 8.5 (L) 8.9 - 10.3 mg/dL   GFR calc non Af Amer 18 (L) >60 mL/min   GFR calc Af Amer 21 (L) >60 mL/min    Comment: (NOTE) The eGFR has been calculated using the CKD EPI equation. This calculation has not been validated in all clinical situations. eGFR's persistently <60 mL/min signify possible Chronic Kidney Disease.    Anion gap 10 5 - 15  Glucose, capillary     Status: None   Collection Time: 09/20/16  8:07 AM  Result Value Ref Range   Glucose-Capillary 92 65 - 99 mg/dL  Glucose,  capillary     Status: Abnormal   Collection Time: 09/20/16 12:11 PM  Result Value Ref Range   Glucose-Capillary 157 (H) 65 - 99 mg/dL  Glucose, capillary     Status: Abnormal   Collection Time: 09/20/16  4:49 PM  Result Value Ref Range   Glucose-Capillary 148 (H) 65 - 99 mg/dL  Glucose, capillary     Status: Abnormal   Collection Time: 09/20/16  9:07 PM  Result Value Ref Range   Glucose-Capillary 156 (H) 65 - 99 mg/dL  CBC     Status: Abnormal   Collection Time: 09/21/16  4:25 AM  Result Value Ref Range   WBC 10.5 4.0 - 10.5 K/uL   RBC 3.40 (L) 3.87 - 5.11 MIL/uL   Hemoglobin 7.2 (L) 12.0 - 15.0 g/dL   HCT 25.7 (L) 36.0 - 46.0 %   MCV 75.6 (L) 78.0 - 100.0 fL   MCH 21.2 (L) 26.0 - 34.0 pg   MCHC 28.0 (L) 30.0 - 36.0 g/dL   RDW 27.0 (H) 11.5 - 15.5 %   Platelets 510 (H) 150 - 400 K/uL  Glucose, capillary     Status: None   Collection Time: 09/21/16  7:41 AM  Result Value Ref Range   Glucose-Capillary 91 65 - 99 mg/dL  Glucose, capillary     Status: Abnormal   Collection Time: 09/21/16 11:14 AM  Result Value Ref Range   Glucose-Capillary 196 (H) 65 - 99 mg/dL      Component Value Date/Time   SDES BLOOD RIGHT HAND 09/17/2016 2332   SPECREQUEST BOTTLES DRAWN AEROBIC AND ANAEROBIC  10CC EACH 09/17/2016 2332   CULT NO GROWTH 2 DAYS 09/17/2016  [place] 09/17/2016 2332   Mr Thoracic Spine Wo Contrast  Result Date: 09/20/2016 CLINICAL DATA:  Mid and low back pain for 2-3 months. No known injury. EXAM: MRI THORACIC AND LUMBAR SPINE WITHOUT CONTRAST TECHNIQUE: Multiplanar and multiecho pulse sequences of the thoracic and lumbar spine were obtained without intravenous contrast. COMPARISON:  New FINDINGS: MRI THORACIC SPINE FINDINGS The study is degraded by patient motion and the patient's size. Alignment:  Maintained. Vertebrae: No fracture is identified. Marrow signal is diffusely decreased on both T1 and T2 weighted imaging. Cord: The thoracic cord demonstrates normal  signal. Focus of T2 hyperintensity centrally in the cord at C4-5 is likely artifactual. Paraspinal and other soft tissues: Unremarkable. Disc levels: Disc bulge at C4-5 appears to efface the ventral thecal sac and may deflect the cord. Shallow left paracentral protrusion T4-5 without central canal or foraminal narrowing. Shallow left paracentral protrusion T5-6 without central canal or foraminal narrowing. Shallow right paracentral protrusion T6-7 without central canal or foraminal narrowing. Shallow right paracentral protrusion T7-8 without central canal or foraminal narrowing. Except as described above, intervertebral disc spaces are unremarkable. MRI LUMBAR SPINE FINDINGS Segmentation:  Unremarkable. Alignment:  Maintained. Vertebrae: No fracture. Decreased T1 and T2 signal seen. Degenerative endplate signal change I0-[id number] noted. Conus medullaris: Extends to the L2 level and appears normal. Paraspinal and other soft tissues: T2 hyperintense lesion the right kidney is likely a cyst. Disc levels: L1-2: Negative. L2-3: Very shallow central protrusion without central canal or foraminal narrowing. L3-4:  Negative. L4-5:  Mild facet degenerative change.  Otherwise negative. L5-S1: Eccentric to the left disc bulge with endplate spur. There is some narrowing in the left lateral recess and foramen. The right foramen is open. IMPRESSION: MR THORACIC SPINE IMPRESSION Degraded study due to the patient's size and motion. Decreased marrow signal is likely in part artifactual related to the patient's size and could also be due to obesity and/or cigarette smoking. Marrow infiltrative process is thought unlikely. Increased T2 signal in the cervical cord at C4-5 is most likely artifactual. If the patient has myelopathic symptoms, cervical spine MRI could be used for further evaluation. Mild thoracic spondylosis without central canal or foraminal stenosis. MR LUMBAR SPINE IMPRESSION Degraded study due to patient motion and size.  Decreased marrow signal in all imaged bones is likely in part artifactual and could also be due to obesity or cigarette smoking. Marrow infiltrative process is thought unlikely. Spondylosis at L5-S1 where there is narrowing in the left lateral recess and foramen without obvious nerve root compression. Electronically Signed   By: Inge Rise M.D.   On: 09/20/2016 16:33   Mr Lumbar Spine Wo Contrast  Result Date: 09/20/2016 CLINICAL DATA:  Mid and low back pain for 2-3 months. No known injury. EXAM: MRI THORACIC AND LUMBAR SPINE WITHOUT CONTRAST TECHNIQUE: Multiplanar and multiecho pulse sequences of the thoracic and lumbar spine were obtained without intravenous contrast. COMPARISON:  New FINDINGS: MRI THORACIC SPINE FINDINGS The study is degraded by patient motion and the patient's size. Alignment:  Maintained. Vertebrae: No fracture is identified. Marrow signal is diffusely decreased on both T1 and T2 weighted imaging. Cord: The thoracic cord demonstrates normal signal. Focus of T2 hyperintensity centrally in the cord at C4-5 is likely artifactual. Paraspinal and other soft tissues: Unremarkable. Disc levels: Disc bulge at C4-5 appears to efface the ventral thecal sac and may deflect the cord. Shallow left paracentral protrusion T4-5 without central canal or foraminal narrowing. Shallow left paracentral protrusion T5-6  without central canal or foraminal narrowing. Shallow right paracentral protrusion T6-7 without central canal or foraminal narrowing. Shallow right paracentral protrusion T7-8 without central canal or foraminal narrowing. Except as described above, intervertebral disc spaces are unremarkable. MRI LUMBAR SPINE FINDINGS Segmentation:  Unremarkable. Alignment:  Maintained. Vertebrae: No fracture. Decreased T1 and T2 signal seen. Degenerative endplate signal change O4-Z1 noted. Conus medullaris: Extends to the L2 level and appears normal. Paraspinal and other soft tissues: T2 hyperintense lesion  the right kidney is likely a cyst. Disc levels: L1-2: Negative. L2-3: Very shallow central protrusion without central canal or foraminal narrowing. L3-4:  Negative. L4-5:  Mild facet degenerative change.  Otherwise negative. L5-S1: Eccentric to the left disc bulge with endplate spur. There is some narrowing in the left lateral recess and foramen. The right foramen is open. IMPRESSION: MR THORACIC SPINE IMPRESSION Degraded study due to the patient's size and motion. Decreased marrow signal is likely in part artifactual related to the patient's size and could also be due to obesity and/or cigarette smoking. Marrow infiltrative process is thought unlikely. Increased T2 signal in the cervical cord at C4-5 is most likely artifactual. If the patient has myelopathic symptoms, cervical spine MRI could be used for further evaluation. Mild thoracic spondylosis without central canal or foraminal stenosis. MR LUMBAR SPINE IMPRESSION Degraded study due to patient motion and size. Decreased marrow signal in all imaged bones is likely in part artifactual and could also be due to obesity or cigarette smoking. Marrow infiltrative process is thought unlikely. Spondylosis at L5-S1 where there is narrowing in the left lateral recess and foramen without obvious nerve root compression. Electronically Signed   By: Inge Rise M.D.   On: 09/20/2016 16:33   Recent Results (from the past 240 hour(s))  Urine culture     Status: Abnormal   Collection Time: 09/17/16  6:45 AM  Result Value Ref Range Status   Specimen Description URINE, RANDOM  Final   Special Requests NONE  Final   Culture MULTIPLE SPECIES PRESENT, SUGGEST RECOLLECTION (A)  Final   Report Status 09/19/2016 FINAL  Final  Culture, blood (routine x 2)     Status: None (Preliminary result)   Collection Time: 09/17/16 11:24 PM  Result Value Ref Range Status   Specimen Description RIGHT ANTECUBITAL  Final   Special Requests   Final    BOTTLES DRAWN AEROBIC AND  ANAEROBIC [place]   Culture NO GROWTH 2 DAYS  Final   Report Status PENDING  Incomplete  Culture, blood (routine x 2)     Status: None (Preliminary result)   Collection Time: 09/17/16 11:32 PM  Result Value Ref Range Status   Specimen Description BLOOD RIGHT HAND  Final   Special Requests BOTTLES DRAWN AEROBIC AND ANAEROBIC  10CC EACH  Final   Culture NO GROWTH 2 DAYS  Final   Report Status PENDING  Incomplete      09/21/2016, 12:52 PM     LOS: 4 days    Records and images were personally reviewed where available.

## 2016-09-21 NOTE — Progress Notes (Signed)
PROGRESS NOTE                                                                                                                                                                                                             Patient Demographics:    [name] [name], is a 65 y.o. female, DOB - [birth year], ZJI:[id number]  Admit date - 09/16/2016   Admitting Physician Waldemar Dickens, MD  Outpatient Primary MD for the patient is Rogers Blocker, MD  LOS - 4  Chief Complaint  Patient presents with  . Shortness of Breath       Brief Narrative    [name] [name] is a 65 y.o. 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 Of note patient has had iron deficiency anemia for which she has been worked up by Dr. Carlean Purl with unremarkable EGD and colonoscopy, in the ER she was diagnosed with severe and efficiency anemia with a hemoglobin of 5.3, she was transfused 2 units of packed RBC and placed on iron with stable H&H at this time.   However she has developed fevers ongoing for 3-4 days as it has 103 with no obvious source, we have checked a CT scan abdomen and pelvis, multiple chest x-rays, UA, MRI T and L-spine which have all been unremarkable, her ESR and CRP are significantly elevated, LDH was borderline, haptoglobin was high, peripheral smear is pending. ID to evaluate for further workup. Patient has a nontoxic appearance. She has been off antibiotics.   Subjective:    [name] [name] today has, No headache, No chest pain, No abdominal pain - No Nausea, No new weakness tingling or numbness, No Cough - SOB.  She overall feels better today.   Assessment  & Plan  :     1. Acute on chronic combined systolic and diastolic CHF. EF 40%. Exacerbated by acute on chronic symptomatically microcytic anemia - we'll transfuse, continue IV Lasix, already much improved, monitor intake and output and daily weights.  2. Recurrent microcytic iron deficiency anemia. Has had workup with Dr. Carlean Purl recently in April with EGD and colonoscopy, no frank source, we'll place her on PPI, may  require small bowel evaluation in the outpatient setting, transfused 2 units of packed RBCs this admission, hemoglobin borderline but stable, we'll repeat in the morning if required may require another unit of transfusion, placed on oral iron, follow with Dr. Carlean Purl. Haptoglobin is high which goes against hemolysis, LDH mildly high, peripheral smear pending.  3. Recurrent Fevers. Chest x-ray, UA nonacute, have added IS & requested to sit up in the chair, Blood Cultures pending but thus far negative, CT Abd-pelvis non acute, she has high ESR-CRP and LDH, only possible source is chronic low back pain ongoing for one year, we checked MRI of T and L-spine which were also unremarkable along with lower extremity venous duplex which was negative as well. We will also await peripheral smear to look for any hematological abnormality [name] to ongoing anemia. I will also consult ID at this time to guide [place] for further workup if any.  4. Morbid Obesity. Follow with PCP for weight loss.  5. CK D stage IV. Baseline creatinine close to 2.4. Close to baseline.   6. History of CVA with some left-sided mild deficits. Continue aspirin and statin for secondary prevention.   7. Anxiety depression. Stable on albuterol.   8. Diabetic peripheral neuropathy. On Neurontin.   9. Hypertension. On beta blocker.  10. DM type II. Continue on present dose Lantus and sliding scale.  CBG (last 3)   Recent Labs  09/20/16 2107 09/21/16 0741 09/21/16 1114  GLUCAP 156* 91 196*      Family Communication  :   none  Code Status :  Full  Diet : Heart Healthy  Disposition Plan  :  Home in am  Consults  :  ID  Procedures  :    Vascular Ultrasound Lower extremity venous duplex has been completed.  Preliminary findings: No evidence of DVT or baker's cyst.  MRI T and L-spine. Nonacute.  CT scan abdomen and pelvis. Nonacute  DVT Prophylaxis  :   SCDs   Lab Results  Component Value Date   PLT 510 (H) 09/21/2016    Inpatient Medications  Scheduled Meds: . aspirin  325 mg Oral Daily  . brinzolamide  1 drop Both Eyes TID  . buPROPion  150 mg Oral BID  . ferrous sulfate  325 mg Oral BID WC  . gabapentin  300 mg Oral QHS  . heparin  5,000 Units Subcutaneous Q8H  . insulin aspart  0-5 Units Subcutaneous QHS  . insulin aspart  0-9 Units Subcutaneous TID WC  . insulin glargine  30 Units Subcutaneous QHS  . latanoprost  1 drop Both Eyes QHS  . loratadine  10 mg Oral Daily  . LORazepam  1 mg Intravenous Once  . metoprolol tartrate  12.5 mg Oral BID  . mometasone-formoterol  2 puff Inhalation BID  . pantoprazole  40 mg Oral Daily  . pravastatin  40 mg Oral QHS  . sodium chloride flush  3 mL Intravenous Q12H  . traMADol  50 mg Oral TID   Continuous Infusions: . sodium chloride 20 mL/hr at 09/16/16 0859   PRN Meds:.acetaminophen **OR** acetaminophen, hydrALAZINE, ondansetron **OR** ondansetron (ZOFRAN) IV  Antibiotics  :    Anti-infectives    None         Objective:   Vitals:   09/20/16 1609 09/20/16 2035 09/20/16 2100 09/21/16 0618  BP: (!) 120/52  136/70 (!) 142/64  Pulse:   70 82  Resp: 20  18 (!) 21  Temp: 97.4 F (36.3 C)  99.4 F (37.4 C) 99.4 F (37.4 C)  TempSrc: Oral     SpO2: 97% 97% 99% 92%  Weight:    134.6 kg (296 lb 11.2 oz)  Height:        Wt Readings from Last 3 Encounters:  09/21/16 134.6 kg (296 lb 11.2 oz)  04/29/16 126.6 kg (279 lb 3.2 oz)  04/21/16 125 kg (275 lb 9.6 oz)     Intake/Output Summary (Last 24 hours) at 09/21/16 1145 Last data  filed at 09/21/16 0619  Gross per 24 hour  Intake              300 ml  Output              900 ml  Net             -600 ml     Physical Exam  Awake Alert, Oriented X 3, No new F.N deficits, Normal affect [place].AT,PERRAL Supple Neck,No JVD, No cervical lymphadenopathy appriciated.  Symmetrical Chest wall movement, Good air movement bilaterally, CTAB RRR,No Gallops,Rubs or new Murmurs, No Parasternal Heave +ve B.Sounds, Abd Soft, No tenderness, No organomegaly appriciated, No rebound - guarding or rigidity. No Cyanosis, Clubbing or edema, No new Rash or bruise       Data Review:    CBC  Recent Labs Lab 09/17/16 0508 09/17/16 2320  09/19/16 0029 09/19/16 0612 09/19/16 0759 09/20/16 0312 09/21/16 0425  WBC 9.0 11.7*  --   --   --  12.8* 10.3 10.5  HGB 6.3* 7.3*  < > 7.1* 7.0* 7.5* 6.9* 7.2*  HCT 22.9* 25.0*  < > 25.1* 24.5* 26.6* 24.1* 25.7*  PLT 508* 513*  --   --   --  507* 457* 510*  MCV 70.9* 72.3*  --   --   --  73.9* 74.2* 75.6*  MCH 19.5* 21.1*  --   --   --  20.8* 21.2* 21.2*  MCHC 27.5* 29.2*  --   --   --  28.2* 28.6* 28.0*  RDW 21.6* 23.7*  --   --   --  24.8* 26.3* 27.0*  LYMPHSABS 1.7 1.1  --   --   --  1.3  --   --   MONOABS 1.0 0.8  --   --   --  1.3*  --   --   EOSABS 0.1 0.1  --   --   --  0.3  --   --   BASOSABS 0.0 0.0  --   --   --  0.0  --   --   < > = values in this interval not displayed.  Chemistries   Recent Labs Lab 09/17/16 0508 09/17/16 2320 09/18/16 0703 09/19/16 0759 09/20/16 0312  NA 139 138 139 135 137  K 3.6 4.0 3.6 3.8 4.4  CL 106 104 105 100* 104  CO2 _0 GLUCOSE 87 240* 156* 72 145*  BUN 41* 38* 38* 39* 44*  CREATININE 2.37* 2.41* 2.47* 2.52* 2.59*  CALCIUM 8.5* 8.6* 8.6* 8.5* 8.5*  AST 15  --   --  15  --   ALT 8*  --   --  10*  --   ALKPHOS 98  --   --  93  --   BILITOT 0.7  --   --  0.6  --     ------------------------------------------------------------------------------------------------------------------ No results for input(s): CHOL, HDL, LDLCALC, TRIG, CHOLHDL, LDLDIRECT in the last 72 hours.  Lab  Results  Component Value Date   HGBA1C 5.9 (H) 09/16/2016   ------------------------------------------------------------------------------------------------------------------ No results for input(s): TSH, T4TOTAL, T3FREE, THYROIDAB in the last 72 hours.  Invalid input(s): FREET3 ------------------------------------------------------------------------------------------------------------------ No results for input(s): VITAMINB12, FOLATE, FERRITIN, TIBC, IRON, RETICCTPCT in the last 72 hours.  Coagulation profile No results for input(s): INR, PROTIME in the last 168 hours.  No results for input(s): DDIMER in the last 72 hours.  Cardiac Enzymes No results for input(s): CKMB, TROPONINI, MYOGLOBIN in the last 168 hours.  Invalid input(s): CK ------------------------------------------------------------------------------------------------------------------    Component Value Date/Time   BNP 450.4 (H) 09/16/2016 5732    Micro Results Recent Results (from the past 240 hour(s))  Urine culture     Status: Abnormal   Collection Time: 09/17/16  6:45 AM  Result Value Ref Range Status   Specimen Description URINE, RANDOM  Final   Special Requests NONE  Final   Culture MULTIPLE SPECIES PRESENT, SUGGEST RECOLLECTION (A)  Final   Report Status 09/19/2016 FINAL  Final  Culture, blood (routine x 2)     Status: None (Preliminary result)   Collection Time: 09/17/16 11:24 PM  Result Value Ref Range Status   Specimen Description RIGHT ANTECUBITAL  Final   Special Requests   Final    BOTTLES DRAWN AEROBIC AND ANAEROBIC RED 6CC BLUE 10CC   Culture NO GROWTH 2 DAYS  Final   Report Status PENDING  Incomplete  Culture, blood (routine x 2)     Status: None (Preliminary result)    Collection Time: 09/17/16 11:32 PM  Result Value Ref Range Status   Specimen Description BLOOD RIGHT HAND  Final   Special Requests BOTTLES DRAWN AEROBIC AND ANAEROBIC  10CC EACH  Final   Culture NO GROWTH 2 DAYS  Final   Report Status PENDING  Incomplete    Radiology Reports Ct Abdomen Pelvis Wo Contrast  Result Date: 09/19/2016 CLINICAL DATA:  Fever.  History of diabetes. EXAM: CT ABDOMEN AND PELVIS WITHOUT CONTRAST TECHNIQUE: Multidetector CT imaging of the abdomen and pelvis was performed following the standard protocol without IV contrast. COMPARISON:  None. FINDINGS: Lower chest: Motion artifact limits evaluation of the lung bases but no focal consolidation noted. Small esophageal hiatal hernia. Hepatobiliary: No focal liver abnormality is seen. No gallstones, gallbladder wall thickening, or biliary dilatation. Pancreas: Unremarkable. No pancreatic ductal dilatation or surrounding inflammatory changes. Spleen: Normal in size without focal abnormality. Adrenals/Urinary Tract: Small stones in the kidneys without evidence of obstruction. Vascular calcifications also seen in the kidneys. No hydronephrosis or hydroureter. Bladder wall is not thickened. Stomach/Bowel: Stomach is within normal limits. Appendix appears normal. No evidence of bowel wall thickening, distention, or inflammatory changes. Vascular/Lymphatic: Calcific aortic atherosclerosis with mild aortic ectasia. Maximal AP diameter is 2.9 cm. Bilateral iliac artery aneurysms measuring 1.8 cm on the right and 1.7 cm on the left. No retroperitoneal hematoma. Scattered lymph nodes in the upper abdomen are not pathologically enlarged and are likely reactive. Reproductive: Uterus and bilateral adnexa are unremarkable. Other: No abdominal wall hernia or abnormality. No abdominopelvic ascites. Musculoskeletal: Degenerative changes in the spine. No destructive bone lesions. Note the examination is technically limited [name] to motion artifact.  IMPRESSION: Small esophageal hiatal hernia. Tiny nonobstructing stones in the kidneys. Aortic atherosclerosis with ectatic aorta and bilateral iliac artery aneurysms. No acute process demonstrated. Electronically Signed   By: Lucienne Capers M.D.   On: 09/19/2016 02:55   Dg Chest 2 View  Result Date: 09/18/2016 CLINICAL DATA:  Patient with shortness  of breath. History of congestive heart failure. EXAM: CHEST  2 VIEW COMPARISON:  Chest radiograph 09/17/2016. FINDINGS: Stable cardiomegaly status post median sternotomy and CABG procedure. Pulmonary vascular redistribution. Mild bilateral perihilar interstitial opacities. Lung bases are excluded from view on lateral view. No pneumothorax. IMPRESSION: Cardiomegaly with mild interstitial edema. Electronically Signed   By: Lovey Newcomer M.D.   On: 09/18/2016 09:26   Mr Thoracic Spine Wo Contrast  Result Date: 09/20/2016 CLINICAL DATA:  Mid and low back pain for 2-3 months. No known injury. EXAM: MRI THORACIC AND LUMBAR SPINE WITHOUT CONTRAST TECHNIQUE: Multiplanar and multiecho pulse sequences of the thoracic and lumbar spine were obtained without intravenous contrast. COMPARISON:  New FINDINGS: MRI THORACIC SPINE FINDINGS The study is degraded by patient motion and the patient's size. Alignment:  Maintained. Vertebrae: No fracture is identified. Marrow signal is diffusely decreased on both T1 and T2 weighted imaging. Cord: The thoracic cord demonstrates normal signal. Focus of T2 hyperintensity centrally in the cord at C4-5 is likely artifactual. Paraspinal and other soft tissues: Unremarkable. Disc levels: Disc bulge at C4-5 appears to efface the ventral thecal sac and may deflect the cord. Shallow left paracentral protrusion T4-5 without central canal or foraminal narrowing. Shallow left paracentral protrusion T5-6 without central canal or foraminal narrowing. Shallow right paracentral protrusion T6-7 without central canal or foraminal narrowing. Shallow right  paracentral protrusion T7-8 without central canal or foraminal narrowing. Except as described above, intervertebral disc spaces are unremarkable. MRI LUMBAR SPINE FINDINGS Segmentation:  Unremarkable. Alignment:  Maintained. Vertebrae: No fracture. Decreased T1 and T2 signal seen. Degenerative endplate signal change N8-G9 noted. Conus medullaris: Extends to the L2 level and appears normal. Paraspinal and other soft tissues: T2 hyperintense lesion the right kidney is likely a cyst. Disc levels: L1-2: Negative. L2-3: Very shallow central protrusion without central canal or foraminal narrowing. L3-4:  Negative. L4-5:  Mild facet degenerative change.  Otherwise negative. L5-S1: Eccentric to the left disc bulge with endplate spur. There is some narrowing in the left lateral recess and foramen. The right foramen is open. IMPRESSION: MR THORACIC SPINE IMPRESSION Degraded study [name] to the patient's size and motion. Decreased marrow signal is likely in part artifactual related to the patient's size and could also be [name] to obesity and/or cigarette smoking. Marrow infiltrative process is thought unlikely. Increased T2 signal in the cervical cord at C4-5 is most likely artifactual. If the patient has myelopathic symptoms, cervical spine MRI could be used for further evaluation. Mild thoracic spondylosis without central canal or foraminal stenosis. MR LUMBAR SPINE IMPRESSION Degraded study [name] to patient motion and size. Decreased marrow signal in all imaged bones is likely in part artifactual and could also be [name] to obesity or cigarette smoking. Marrow infiltrative process is thought unlikely. Spondylosis at L5-S1 where there is narrowing in the left lateral recess and foramen without obvious nerve root compression. Electronically Signed   By: Inge Rise M.D.   On: 09/20/2016 16:33   Mr Lumbar Spine Wo Contrast  Result Date: 09/20/2016 CLINICAL DATA:  Mid and low back pain for 2-3 months. No known injury. EXAM: MRI  THORACIC AND LUMBAR SPINE WITHOUT CONTRAST TECHNIQUE: Multiplanar and multiecho pulse sequences of the thoracic and lumbar spine were obtained without intravenous contrast. COMPARISON:  New FINDINGS: MRI THORACIC SPINE FINDINGS The study is degraded by patient motion and the patient's size. Alignment:  Maintained. Vertebrae: No fracture is identified. Marrow signal is diffusely decreased on both T1 and T2 weighted imaging. Cord:  The thoracic cord demonstrates normal signal. Focus of T2 hyperintensity centrally in the cord at C4-5 is likely artifactual. Paraspinal and other soft tissues: Unremarkable. Disc levels: Disc bulge at C4-5 appears to efface the ventral thecal sac and may deflect the cord. Shallow left paracentral protrusion T4-5 without central canal or foraminal narrowing. Shallow left paracentral protrusion T5-6 without central canal or foraminal narrowing. Shallow right paracentral protrusion T6-7 without central canal or foraminal narrowing. Shallow right paracentral protrusion T7-8 without central canal or foraminal narrowing. Except as described above, intervertebral disc spaces are unremarkable. MRI LUMBAR SPINE FINDINGS Segmentation:  Unremarkable. Alignment:  Maintained. Vertebrae: No fracture. Decreased T1 and T2 signal seen. Degenerative endplate signal change E7-N1 noted. Conus medullaris: Extends to the L2 level and appears normal. Paraspinal and other soft tissues: T2 hyperintense lesion the right kidney is likely a cyst. Disc levels: L1-2: Negative. L2-3: Very shallow central protrusion without central canal or foraminal narrowing. L3-4:  Negative. L4-5:  Mild facet degenerative change.  Otherwise negative. L5-S1: Eccentric to the left disc bulge with endplate spur. There is some narrowing in the left lateral recess and foramen. The right foramen is open. IMPRESSION: MR THORACIC SPINE IMPRESSION Degraded study [name] to the patient's size and motion. Decreased marrow signal is likely in part  artifactual related to the patient's size and could also be [name] to obesity and/or cigarette smoking. Marrow infiltrative process is thought unlikely. Increased T2 signal in the cervical cord at C4-5 is most likely artifactual. If the patient has myelopathic symptoms, cervical spine MRI could be used for further evaluation. Mild thoracic spondylosis without central canal or foraminal stenosis. MR LUMBAR SPINE IMPRESSION Degraded study [name] to patient motion and size. Decreased marrow signal in all imaged bones is likely in part artifactual and could also be [name] to obesity or cigarette smoking. Marrow infiltrative process is thought unlikely. Spondylosis at L5-S1 where there is narrowing in the left lateral recess and foramen without obvious nerve root compression. Electronically Signed   By: Inge Rise M.D.   On: 09/20/2016 16:33   Dg Chest Port 1 View  Result Date: 09/17/2016 CLINICAL DATA:  Fever and shortness of breath. EXAM: PORTABLE CHEST 1 VIEW COMPARISON:  09/16/2016 FINDINGS: Postoperative changes in the mediastinum. Cardiac enlargement with mild pulmonary vascular congestion. Hazy interstitial opacities in the mid lungs likely representing early interstitial edema. No blunting of costophrenic angles. No pneumothorax. No focal consolidation. IMPRESSION: Cardiac enlargement with pulmonary vascular congestion and mild interstitial edema. Electronically Signed   By: Lucienne Capers M.D.   On: 09/17/2016 23:43   Dg Chest Port 1 View  Result Date: 09/16/2016 CLINICAL DATA:  Shortness of Breath EXAM: PORTABLE CHEST 1 VIEW COMPARISON:  April 17, 2016 FINDINGS: There is no edema or consolidation. Heart is borderline enlarged with pulmonary venous hypertension. Patient is status post coronary artery bypass grafting. No adenopathy. No bone lesions. IMPRESSION: Pulmonary vascular congestion without frank edema or consolidation. Stable cardiac silhouette. Electronically Signed   By: Lowella Grip III  M.D.   On: 09/16/2016 08:07    Time Spent in minutes  30   , K M.D on 09/21/2016 at 11:45 AM  Between 7am to 7pm - Pager - [phone]  After 7pm go to [email or web address] - password [facility]  [facility] -  Office  [phone]

## 2016-09-21 NOTE — Progress Notes (Signed)
*  PRELIMINARY RESULTS* Vascular Ultrasound Lower extremity venous duplex has been completed.  Preliminary findings: No evidence of DVT or baker's cyst.  Landry Mellow, RDMS, RVT  09/21/2016, 9:01 AM

## 2016-09-22 DIAGNOSIS — D509 Iron deficiency anemia, unspecified: Secondary | ICD-10-CM

## 2016-09-22 DIAGNOSIS — R509 Fever, unspecified: Secondary | ICD-10-CM

## 2016-09-22 LAB — HEPATITIS PANEL, ACUTE
HEP B C IGM: NEGATIVE
HEP B S AG: NEGATIVE
Hep A IgM: NEGATIVE

## 2016-09-22 LAB — GLUCOSE, CAPILLARY
GLUCOSE-CAPILLARY: 124 mg/dL — AB (ref 65–99)
Glucose-Capillary: 74 mg/dL (ref 65–99)
Glucose-Capillary: 138 mg/dL — ABNORMAL HIGH (ref 65–99)
Glucose-Capillary: 210 mg/dL — ABNORMAL HIGH (ref 65–99)

## 2016-09-22 LAB — HEMOGLOBIN AND HEMATOCRIT, BLOOD
HEMATOCRIT: 26.1 % — AB (ref 36.0–46.0)
HEMOGLOBIN: 7.2 g/dL — AB (ref 12.0–15.0)

## 2016-09-22 LAB — CBC
HCT: 24.2 % — ABNORMAL LOW (ref 36.0–46.0)
HEMOGLOBIN: 6.8 g/dL — AB (ref 12.0–15.0)
MCH: 21.7 pg — AB (ref 26.0–34.0)
MCHC: 28.1 g/dL — AB (ref 30.0–36.0)
MCV: 77.3 fL — AB (ref 78.0–100.0)
Platelets: 455 10*3/uL — ABNORMAL HIGH (ref 150–400)
RBC: 3.13 MIL/uL — ABNORMAL LOW (ref 3.87–5.11)
RDW: 27.9 % — ABNORMAL HIGH (ref 11.5–15.5)
WBC: 9 10*3/uL (ref 4.0–10.5)

## 2016-09-22 LAB — RPR: RPR: NONREACTIVE

## 2016-09-22 LAB — PATHOLOGIST SMEAR REVIEW

## 2016-09-22 LAB — PREPARE RBC (CROSSMATCH)

## 2016-09-22 LAB — HIV ANTIBODY (ROUTINE TESTING W REFLEX): HIV SCREEN 4TH GENERATION: NONREACTIVE

## 2016-09-22 MED ORDER — HEPARIN SODIUM (PORCINE) 5000 UNIT/ML IJ SOLN: 5000.0000 [IU] | Freq: Three times a day (TID) | INTRAMUSCULAR | Status: DC

## 2016-09-22 MED ORDER — SODIUM CHLORIDE 0.9 % IV SOLN
Freq: Once | INTRAVENOUS | Status: AC
  Administered 2016-09-22: 11:00:00 via INTRAVENOUS

## 2016-09-22 NOTE — Consult Note (Signed)
Chief Complaint: Patient was seen in consultation today for bone marrow biopsy Chief Complaint  Patient presents with  . Shortness of Breath   at the request of Dr Meryle Ready  Referring Physician(s): Dr Ronnie Derby  Supervising Physician: Jacqulynn Cadet  Patient Status: Inpatient  History of Present Illness: [name] [name] is a 65 y.o. female   Chronic anemia Fever Hx CHF; CKD; CAD; CVA; Polysubstance abuse Colonoscopy 03/2016 wnl---IDA Worsening SOB and leg edema Pronounced anemia Ongoing low grade temps  Request made for bone marrow biopsy per Dr Candiss Norse Scheduled for 9/26 in Radiology  Past Medical History:  Diagnosis Date  . Anemia 08/2016  . Arthritis of left shoulder region 03/23/2013  . Chronic combined systolic and diastolic CHF (congestive heart failure) (HCC)    a. EF 40-45%, mild LVH, mid apicalanteroseptal and apical HK.  . CKD (chronic kidney disease), stage III   . Cocaine abuse    crack cocaine heavily until 2008 then sporadic use since then  . Coronary artery disease    a. 06/2012 NSTEMI/CABG x 3 (LIMA->LAD, VG->OM2, VG->LCX);  b. 04/2015 MV: EF<30%, mid ant, apicalanterior, apical infarct;  c. 04/2015 Cath: LM nl, LAD 90p, LCX 50m OM1 min irregs, RCA mild dzs, LIMA->LAD nl w/ dist LAD dzs, VG->OM2 nl, VG->LCX nl-->Med Rx.  . CVA (cerebral infarction)    a. right internal capsule stroke in 12/2006  . Diabetes mellitus    diagnosed in 2008  . Essential hypertension   . Glaucoma   . Hyperlipidemia   . Left-sided sensory deficit present   . [place]    a. 06/2012 ABI's: R - 0.73, L - 0.71.  .Marland KitchenShortness of breath dyspnea   . Thyroid nodule    FNA in 27893showed follicular cells but not definate neoplasm  . Tobacco abuse     Past Surgical History:  Procedure Laterality Date  . CARDIAC CATHETERIZATION    . CARDIAC CATHETERIZATION N/A 05/17/2015   Procedure: Left Heart Cath and Cors/Grafts Angiography;  Surgeon: MSherren Mocha MD;  [facility];  Service: Cardiovascular;  Laterality: N/A;  . COLONOSCOPY WITH PROPOFOL N/A 04/21/2016   Procedure: COLONOSCOPY WITH PROPOFOL;  Surgeon: JIrene Shipper MD;  [place] [facility]  Service: Endoscopy;  Laterality: N/A;  . CORONARY ARTERY BYPASS GRAFT  07/09/2012   Procedure: CORONARY ARTERY BYPASS GRAFTING (CABG);  Surgeon: PIvin Poot MD;  [place]  Service: Open Heart Surgery;  Laterality: N/A;  . ESOPHAGOGASTRODUODENOSCOPY N/A 04/20/2016   Procedure: ESOPHAGOGASTRODUODENOSCOPY (EGD);  Surgeon: CGatha Mayer MD;  [facility];  Service: Endoscopy;  Laterality: N/A;  . LEFT HEART CATHETERIZATION WITH CORONARY ANGIOGRAM N/A 06/29/2012   Procedure: LEFT HEART CATHETERIZATION WITH CORONARY ANGIOGRAM;  Surgeon: Peter M JMartinique MD;  [facility];  Service: Cardiovascular;  Laterality: N/A;  . STERNAL WOUND DEBRIDEMENT  08/17/2012   Procedure: STERNAL WOUND DEBRIDEMENT;  Surgeon: PIvin Poot MD;  [facility];  Service: Thoracic;  Laterality: N/A;  wound vac application  . STERNAL WOUND DEBRIDEMENT  08/24/2012   Procedure: STERNAL WOUND DEBRIDEMENT;  Surgeon: PIvin Poot MD;  [place]  Service: Thoracic;  Laterality: N/A;  . STERNAL WOUND DEBRIDEMENT  09/01/2012   Procedure: STERNAL WOUND DEBRIDEMENT;  Surgeon: PIvin Poot MD;  [place] [facility]  Service: Thoracic;  Laterality: N/A;  . STERNAL WOUND DEBRIDEMENT  09/20/2012   Procedure: STERNAL WOUND DEBRIDEMENT;  Surgeon: PCollier Salina  Prescott Gum, MD;  Location: [facility];  Service: Thoracic;  Laterality: N/A;  wound vac change    Allergies: Naproxen  Medications: Prior to Admission medications   Medication Sig Start Date End Date Taking? Authorizing Provider  aspirin 325 MG tablet Take 325 mg by mouth daily.   Yes Historical Provider, MD  brinzolamide (AZOPT) 1 % ophthalmic suspension Place 1 drop into both eyes 3 (three) times daily.   Yes Historical Provider, MD    buPROPion (WELLBUTRIN SR) 150 MG 12 hr tablet Take 1 tablet (150 mg total) by mouth 2 (two) times daily. 01/16/16  Yes [name], DO  cetirizine (ZYRTEC) 10 MG tablet Take 10 mg by mouth daily as needed for allergies.    Yes Historical Provider, MD  clotrimazole-betamethasone (LOTRISONE) cream Apply 1 application topically 2 (two) times daily. On left hand between fingers 09/15/16  Yes Historical Provider, MD  ferrous sulfate 325 (65 FE) MG tablet Take 1 tablet (325 mg total) by mouth 2 (two) times daily with a meal. 04/21/16  Yes Bonnielee Haff, MD  Fluticasone-Salmeterol (ADVAIR) 250-50 MCG/DOSE AEPB Inhale 1 puff into the lungs every 12 (twelve) hours.   Yes Historical Provider, MD  furosemide (LASIX) 40 MG tablet Take 1 tablet (40 mg total) by mouth daily. Take additional dose if you experience weight gain. 05/17/15  Yes [name], DO  gabapentin (NEURONTIN) 300 MG capsule Take 300 mg by mouth at bedtime.  01/31/16  Yes Historical Provider, MD  glimepiride (AMARYL) 4 MG tablet Take 4 mg by mouth daily. 04/07/16  Yes Historical Provider, MD  Insulin Glargine (LANTUS SOLOSTAR) 100 UNIT/ML Solostar Pen Inject 25 Units into the skin daily at 10 pm. 01/16/16  Yes [name], DO  latanoprost (XALATAN) 0.005 % ophthalmic solution Place 1 drop into both eyes at bedtime.   Yes Historical Provider, MD  metoprolol tartrate (LOPRESSOR) 25 MG tablet Take 0.5 tablets (12.5 mg total) by mouth 2 (two) times daily. 01/16/16  Yes [name], DO  pravastatin (PRAVACHOL) 40 MG tablet Take 40 mg by mouth at bedtime.   Yes Historical Provider, MD  traMADol (ULTRAM) 50 MG tablet Take 50 mg by mouth 3 (three) times daily.    Yes Historical Provider, MD  aspirin EC 81 MG EC tablet Take 1 tablet (81 mg total) by mouth daily. Patient not taking: Reported on 09/16/2016 01/16/16   Cristal Ford, DO     Family History  Problem Relation Age of Onset  . Diabetes Mother   . Hypertension Mother   .  Hyperlipidemia Father   . Other      no known family CAD    Social History   Social History  . Marital status: Single    Spouse name: N/A  . Number of children: N/A  . Years of education: N/A   Social History Main Topics  . Smoking status: Current Some Day Smoker    Packs/day: 0.25    Years: 50.00  . Smokeless tobacco: Never Used  . Alcohol use No  . Drug use:     Types: Cocaine  . Sexual activity: No   Other Topics Concern  . None   Social History Narrative   Lives with boyfriend of 20 years.      Review of Systems: A 12 point ROS discussed and pertinent positives are indicated in the HPI above.  All other systems are negative.  Review of Systems  Constitutional: Positive for activity change, fatigue and fever. Negative for appetite change.  Respiratory: Positive for shortness of breath. Negative for wheezing.   Gastrointestinal: Negative for abdominal pain.  Neurological: Positive for weakness.  Psychiatric/Behavioral: Negative for behavioral problems and confusion.    Vital Signs: BP (!) 131/57 (BP Location: Left Arm)   Pulse 76   Temp 99.1 F (37.3 C) (Oral)   Resp 17   Ht '5\' 8"'  (1.727 m)   Wt 297 lb 3.2 oz (134.8 kg)   SpO2 100%   BMI 45.19 kg/m   Physical Exam  Constitutional: She is oriented to person, place, and time.  Cardiovascular: Normal rate and regular rhythm.   Pulmonary/Chest: Effort normal and breath sounds normal.  Abdominal: Soft. Bowel sounds are normal.  Musculoskeletal: Normal range of motion.  Neurological: She is alert and oriented to person, place, and time.  Skin: Skin is warm and dry.  Psychiatric: She has a normal mood and affect. Her behavior is normal. Judgment and thought content normal.  Nursing note and vitals reviewed.   Mallampati Score:  MD Evaluation Airway: WNL Heart: WNL Abdomen: WNL Chest/ Lungs: WNL ASA  Classification: 3 Mallampati/Airway Score: Two  Imaging: Ct Abdomen Pelvis Wo Contrast  Result  Date: 09/19/2016 CLINICAL DATA:  Fever.  History of diabetes. EXAM: CT ABDOMEN AND PELVIS WITHOUT CONTRAST TECHNIQUE: Multidetector CT imaging of the abdomen and pelvis was performed following the standard protocol without IV contrast. COMPARISON:  None. FINDINGS: Lower chest: Motion artifact limits evaluation of the lung bases but no focal consolidation noted. Small esophageal hiatal hernia. Hepatobiliary: No focal liver abnormality is seen. No gallstones, gallbladder wall thickening, or biliary dilatation. Pancreas: Unremarkable. No pancreatic ductal dilatation or surrounding inflammatory changes. Spleen: Normal in size without focal abnormality. Adrenals/Urinary Tract: Small stones in the kidneys without evidence of obstruction. Vascular calcifications also seen in the kidneys. No hydronephrosis or hydroureter. Bladder wall is not thickened. Stomach/Bowel: Stomach is within normal limits. Appendix appears normal. No evidence of bowel wall thickening, distention, or inflammatory changes. Vascular/Lymphatic: Calcific aortic atherosclerosis with mild aortic ectasia. Maximal AP diameter is 2.9 cm. Bilateral iliac artery aneurysms measuring 1.8 cm on the right and 1.7 cm on the left. No retroperitoneal hematoma. Scattered lymph nodes in the upper abdomen are not pathologically enlarged and are likely reactive. Reproductive: Uterus and bilateral adnexa are unremarkable. Other: No abdominal wall hernia or abnormality. No abdominopelvic ascites. Musculoskeletal: Degenerative changes in the spine. No destructive bone lesions. Note the examination is technically limited due to motion artifact. IMPRESSION: Small esophageal hiatal hernia. Tiny nonobstructing stones in the kidneys. Aortic atherosclerosis with ectatic aorta and bilateral iliac artery aneurysms. No acute process demonstrated. Electronically Signed   By: Lucienne Capers M.D.   On: 09/19/2016 02:55   Dg Chest 2 View  Result Date: 09/18/2016 CLINICAL DATA:   Patient with shortness of breath. History of congestive heart failure. EXAM: CHEST  2 VIEW COMPARISON:  Chest radiograph 09/17/2016. FINDINGS: Stable cardiomegaly status post median sternotomy and CABG procedure. Pulmonary vascular redistribution. Mild bilateral perihilar interstitial opacities. Lung bases are excluded from view on lateral view. No pneumothorax. IMPRESSION: Cardiomegaly with mild interstitial edema. Electronically Signed   By: Lovey Newcomer M.D.   On: 09/18/2016 09:26   Mr Thoracic Spine Wo Contrast  Result Date: 09/20/2016 CLINICAL DATA:  Mid and low back pain for 2-3 months. No known injury. EXAM: MRI THORACIC AND LUMBAR SPINE WITHOUT CONTRAST TECHNIQUE: Multiplanar and multiecho pulse sequences of the thoracic and lumbar spine were obtained without intravenous contrast. COMPARISON:  New FINDINGS: MRI THORACIC  SPINE FINDINGS The study is degraded by patient motion and the patient's size. Alignment:  Maintained. Vertebrae: No fracture is identified. Marrow signal is diffusely decreased on both T1 and T2 weighted imaging. Cord: The thoracic cord demonstrates normal signal. Focus of T2 hyperintensity centrally in the cord at C4-5 is likely artifactual. Paraspinal and other soft tissues: Unremarkable. Disc levels: Disc bulge at C4-5 appears to efface the ventral thecal sac and may deflect the cord. Shallow left paracentral protrusion T4-5 without central canal or foraminal narrowing. Shallow left paracentral protrusion T5-6 without central canal or foraminal narrowing. Shallow right paracentral protrusion T6-7 without central canal or foraminal narrowing. Shallow right paracentral protrusion T7-8 without central canal or foraminal narrowing. Except as described above, intervertebral disc spaces are unremarkable. MRI LUMBAR SPINE FINDINGS Segmentation:  Unremarkable. Alignment:  Maintained. Vertebrae: No fracture. Decreased T1 and T2 signal seen. Degenerative endplate signal change S4-H6 noted. Conus  medullaris: Extends to the L2 level and appears normal. Paraspinal and other soft tissues: T2 hyperintense lesion the right kidney is likely a cyst. Disc levels: L1-2: Negative. L2-3: Very shallow central protrusion without central canal or foraminal narrowing. L3-4:  Negative. L4-5:  Mild facet degenerative change.  Otherwise negative. L5-S1: Eccentric to the left disc bulge with endplate spur. There is some narrowing in the left lateral recess and foramen. The right foramen is open. IMPRESSION: MR THORACIC SPINE IMPRESSION Degraded study due to the patient's size and motion. Decreased marrow signal is likely in part artifactual related to the patient's size and could also be due to obesity and/or cigarette smoking. Marrow infiltrative process is thought unlikely. Increased T2 signal in the cervical cord at C4-5 is most likely artifactual. If the patient has myelopathic symptoms, cervical spine MRI could be used for further evaluation. Mild thoracic spondylosis without central canal or foraminal stenosis. MR LUMBAR SPINE IMPRESSION Degraded study due to patient motion and size. Decreased marrow signal in all imaged bones is likely in part artifactual and could also be due to obesity or cigarette smoking. Marrow infiltrative process is thought unlikely. Spondylosis at L5-S1 where there is narrowing in the left lateral recess and foramen without obvious nerve root compression. Electronically Signed   By: Inge Rise M.D.   On: 09/20/2016 16:33   Mr Lumbar Spine Wo Contrast  Result Date: 09/20/2016 CLINICAL DATA:  Mid and low back pain for 2-3 months. No known injury. EXAM: MRI THORACIC AND LUMBAR SPINE WITHOUT CONTRAST TECHNIQUE: Multiplanar and multiecho pulse sequences of the thoracic and lumbar spine were obtained without intravenous contrast. COMPARISON:  New FINDINGS: MRI THORACIC SPINE FINDINGS The study is degraded by patient motion and the patient's size. Alignment:  Maintained. Vertebrae: No  fracture is identified. Marrow signal is diffusely decreased on both T1 and T2 weighted imaging. Cord: The thoracic cord demonstrates normal signal. Focus of T2 hyperintensity centrally in the cord at C4-5 is likely artifactual. Paraspinal and other soft tissues: Unremarkable. Disc levels: Disc bulge at C4-5 appears to efface the ventral thecal sac and may deflect the cord. Shallow left paracentral protrusion T4-5 without central canal or foraminal narrowing. Shallow left paracentral protrusion T5-6 without central canal or foraminal narrowing. Shallow right paracentral protrusion T6-7 without central canal or foraminal narrowing. Shallow right paracentral protrusion T7-8 without central canal or foraminal narrowing. Except as described above, intervertebral disc spaces are unremarkable. MRI LUMBAR SPINE FINDINGS Segmentation:  Unremarkable. Alignment:  Maintained. Vertebrae: No fracture. Decreased T1 and T2 signal seen. Degenerative endplate signal change P5-F1 noted. Conus  medullaris: Extends to the L2 level and appears normal. Paraspinal and other soft tissues: T2 hyperintense lesion the right kidney is likely a cyst. Disc levels: L1-2: Negative. L2-3: Very shallow central protrusion without central canal or foraminal narrowing. L3-4:  Negative. L4-5:  Mild facet degenerative change.  Otherwise negative. L5-S1: Eccentric to the left disc bulge with endplate spur. There is some narrowing in the left lateral recess and foramen. The right foramen is open. IMPRESSION: MR THORACIC SPINE IMPRESSION Degraded study due to the patient's size and motion. Decreased marrow signal is likely in part artifactual related to the patient's size and could also be due to obesity and/or cigarette smoking. Marrow infiltrative process is thought unlikely. Increased T2 signal in the cervical cord at C4-5 is most likely artifactual. If the patient has myelopathic symptoms, cervical spine MRI could be used for further evaluation. Mild  thoracic spondylosis without central canal or foraminal stenosis. MR LUMBAR SPINE IMPRESSION Degraded study due to patient motion and size. Decreased marrow signal in all imaged bones is likely in part artifactual and could also be due to obesity or cigarette smoking. Marrow infiltrative process is thought unlikely. Spondylosis at L5-S1 where there is narrowing in the left lateral recess and foramen without obvious nerve root compression. Electronically Signed   By: Inge Rise M.D.   On: 09/20/2016 16:33   Dg Chest Port 1 View  Result Date: 09/17/2016 CLINICAL DATA:  Fever and shortness of breath. EXAM: PORTABLE CHEST 1 VIEW COMPARISON:  09/16/2016 FINDINGS: Postoperative changes in the mediastinum. Cardiac enlargement with mild pulmonary vascular congestion. Hazy interstitial opacities in the mid lungs likely representing early interstitial edema. No blunting of costophrenic angles. No pneumothorax. No focal consolidation. IMPRESSION: Cardiac enlargement with pulmonary vascular congestion and mild interstitial edema. Electronically Signed   By: Lucienne Capers M.D.   On: 09/17/2016 23:43   Dg Chest Port 1 View  Result Date: 09/16/2016 CLINICAL DATA:  Shortness of Breath EXAM: PORTABLE CHEST 1 VIEW COMPARISON:  April 17, 2016 FINDINGS: There is no edema or consolidation. Heart is borderline enlarged with pulmonary venous hypertension. Patient is status post coronary artery bypass grafting. No adenopathy. No bone lesions. IMPRESSION: Pulmonary vascular congestion without frank edema or consolidation. Stable cardiac silhouette. Electronically Signed   By: Lowella Grip III M.D.   On: 09/16/2016 08:07    Labs:  CBC:  Recent Labs  09/17/16 2320  09/19/16 0612 09/19/16 0759 09/20/16 0312 09/21/16 0425  WBC 11.7*  --   --  12.8* 10.3 10.5  HGB 7.3*  < > 7.0* 7.5* 6.9* 7.2*  HCT 25.0*  < > 24.5* 26.6* 24.1* 25.7*  PLT 513*  --   --  507* 457* 510*  < > = values in this interval not  displayed.  COAGS:  Recent Labs  01/11/16 0225 04/17/16 2327  INR 1.19 1.12  APTT 27 27    BMP:  Recent Labs  09/17/16 2320 09/18/16 0703 09/19/16 0759 09/20/16 0312  NA 138 139 135 137  K 4.0 3.6 3.8 4.4  CL 104 105 100* 104  CO2 '23 23 24 23  ' GLUCOSE 240* 156* 72 145*  BUN 38* 38* 39* 44*  CALCIUM 8.6* 8.6* 8.5* 8.5*  CREATININE 2.41* 2.47* 2.52* 2.59*  GFRNONAA 20* 19* 19* 18*  GFRAA 23* 22* 22* 21*    LIVER FUNCTION TESTS:  Recent Labs  01/15/16 0603 04/18/16 1124 09/17/16 0508 09/19/16 0759  BILITOT 0.7 0.4 0.7 0.6  AST '16 15 15 ' 15  ALT 12* 11* 8* 10*  ALKPHOS 92 104 98 93  PROT 6.9 6.7 5.9* 6.5  ALBUMIN 3.0* 2.8* 2.5* 2.7*    TUMOR MARKERS: No results for input(s): AFPTM, CEA, CA199, CHROMGRNA in the last 8760 hours.  Assessment and Plan:  Chronic anemia Fevers For BM bx in Rad 9/26 Risks and Benefits discussed with the patient including, but not limited to bleeding, infection, damage to adjacent structures or low yield requiring additional tests. All of the patient's questions were answered, patient is agreeable to proceed. Consent signed and in chart.  Thank you for this interesting consult.  I greatly enjoyed meeting [name] [name] and look forward to participating in their care.  A copy of this report was sent to the requesting provider on this date.  Electronically Signed: Monia Sabal A 09/22/2016, 9:33 AM   I spent a total of 20 Minutes    in face to face in clinical consultation, greater than 50% of which was counseling/coordinating care for BM bx

## 2016-09-22 NOTE — Progress Notes (Addendum)
PROGRESS NOTE                                                                                                                                                                                                             Patient Demographics:    [name] [name], is a 65 y.o. female, DOB - 04/28/1951, JLL:[id number]  Admit date - 09/16/2016   Admitting Physician Waldemar Dickens, MD  Outpatient Primary MD for the patient is Rogers Blocker, MD  LOS - 5  Chief Complaint  Patient presents with  . Shortness of Breath       Brief Narrative    [name] [name] is a 65 y.o. 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 Of note patient has had iron deficiency anemia for which she has been worked up by Dr. Carlean Purl with unremarkable EGD and colonoscopy, in the ER she was diagnosed with severe and efficiency anemia with a hemoglobin of 5.3, she was transfused 2 units of packed RBC and placed on iron with stable H&H at this time.   However she has developed fevers ongoing for 3-4 days as it has 103 with no obvious source, we have checked a CT scan abdomen and pelvis, multiple chest x-rays, UA, MRI T and L-spine which have all been unremarkable, her ESR and CRP are significantly elevated, LDH was borderline, haptoglobin was high, peripheral smear is pending. ID to evaluate for further workup. Patient has a nontoxic appearance. She has been off antibiotics.   Subjective:    Adaleah Forget today has, No headache, No chest pain, No abdominal pain - No Nausea, No new weakness tingling or numbness, No Cough - SOB.  She overall feels better today.   Assessment  & Plan  :     1. Acute on chronic combined systolic and diastolic CHF. EF 40%. Exacerbated by acute on chronic symptomatically microcytic anemia - we'll transfuse, continue IV Lasix, already much improved, monitor intake and output and daily weights.  2. Recurrent microcytic iron deficiency anemia. Has had workup with Dr. Carlean Purl recently in April with EGD and colonoscopy, no frank source, we'll place her on PPI, may  require small bowel evaluation in the outpatient setting, transfused 2 units of packed RBCs on the day of admission will give another unit on 09/22/2016, placed on oral iron, follow with Dr. Carlean Purl. Haptoglobin is high which goes against hemolysis, LDH mildly high, peripheral smear  pending.  3. Recurrent Fevers. Chest x-ray, UA nonacute, have added IS & requested to sit up in the chair, Blood Cultures pending but thus far negative, CT Abd-pelvis non acute, she has high ESR-CRP and LDH, only possible source is chronic low back pain ongoing for one year, we checked MRI of T and L-spine which were also unremarkable along with lower extremity venous duplex which was negative as well. ID has been consulted, per ID bone marrow as he ordered, peripheral smear, acute hepatitis and HIV serology pending.  4. Morbid Obesity. Follow with PCP for weight loss.  5. CK D stage IV. Baseline creatinine close to 2.4. Close to baseline.   6. History of CVA with some left-sided mild deficits. Continue aspirin and statin for secondary prevention.   7. Anxiety depression. Stable on albuterol.   8. Diabetic peripheral neuropathy. On Neurontin.   9. Hypertension. On beta blocker.  10. DM type II. Continue on present dose Lantus and sliding scale.  CBG (last 3)   Recent Labs  09/21/16 1629 09/21/16 2155 09/22/16 0726  GLUCAP 323* 121* 59      Family Communication  :  none  Code Status :  Full  Diet : Heart Healthy  Disposition Plan  :  Home in am  Consults  :  ID  Procedures  :     Vascular Ultrasound Lower extremity venous duplex has been completed.  Preliminary findings: No evidence of DVT or baker's cyst.  MRI T and L-spine. Nonacute.  CT scan abdomen and pelvis. Nonacute  DVT Prophylaxis  :   SCDs   Lab Results  Component Value Date   PLT 455 (H) 09/22/2016    Inpatient Medications  Scheduled Meds: . sodium chloride   Intravenous Once  . aspirin  325 mg Oral Daily  . brinzolamide  1 drop Both Eyes TID  . buPROPion  150 mg Oral BID  . ferrous sulfate  325 mg Oral BID WC  . gabapentin  300 mg Oral QHS  . [START ON 09/23/2016] heparin  5,000 Units Subcutaneous Q8H  . insulin aspart  0-5 Units Subcutaneous QHS  . insulin aspart  0-9 Units Subcutaneous TID WC  . insulin glargine  30 Units Subcutaneous QHS  . latanoprost  1 drop Both Eyes QHS  . loratadine  10 mg Oral Daily  . LORazepam  1 mg Intravenous Once  . metoprolol tartrate  12.5 mg Oral BID  . mometasone-formoterol  2 puff Inhalation BID  . pantoprazole  40 mg Oral Daily  . pravastatin  40 mg Oral QHS  . sodium chloride flush  3 mL Intravenous Q12H  . traMADol  50 mg Oral TID   Continuous Infusions: . sodium chloride 20 mL/hr at 09/16/16 0859   PRN Meds:.acetaminophen **OR** acetaminophen, hydrALAZINE, ondansetron **OR** ondansetron (ZOFRAN) IV  Antibiotics  :    Anti-infectives    None         Objective:   Vitals:   09/22/16 0500 09/22/16 0738 09/22/16 0840 09/22/16 0941  BP: 128/62  (!) 131/57 (!) 115/53  Pulse: 74  76 76  Resp: 18  17   Temp: 98.9 F (37.2 C)  99.1 F (37.3 C)   TempSrc:  Oral   SpO2: 92% 95% 100%   Weight: 134.8 kg (297 lb 3.2 oz)     Height:        Wt Readings from Last 3 Encounters:  09/22/16 134.8 kg (297 lb 3.2 oz)  04/29/16 126.6 kg (279 lb 3.2 oz)  04/21/16 125 kg (275 lb 9.6 oz)     Intake/Output Summary (Last 24 hours) at 09/22/16 1032 Last data filed at 09/22/16 0851  Gross per 24 hour  Intake              660 ml  Output               700 ml  Net              -40 ml     Physical Exam  Awake Alert, Oriented X 3, No new F.N deficits, Normal affect [name].AT,PERRAL Supple Neck,No JVD, No cervical lymphadenopathy appriciated.  Symmetrical Chest wall movement, Good air movement bilaterally, CTAB RRR,No Gallops,Rubs or new Murmurs, No Parasternal Heave +ve B.Sounds, Abd Soft, No tenderness, No organomegaly appriciated, No rebound - guarding or rigidity. No Cyanosis, Clubbing or edema, No new Rash or bruise       Data Review:    CBC  Recent Labs Lab 09/17/16 0508 09/17/16 2320  09/19/16 0612 09/19/16 0759 09/20/16 0312 09/21/16 0425 09/22/16 0854  WBC 9.0 11.7*  --   --  12.8* 10.3 10.5 9.0  HGB 6.3* 7.3*  < > 7.0* 7.5* 6.9* 7.2* 6.8*  HCT 22.9* 25.0*  < > 24.5* 26.6* 24.1* 25.7* 24.2*  PLT 508* 513*  --   --  507* 457* 510* 455*  MCV 70.9* 72.3*  --   --  73.9* 74.2* 75.6* 77.3*  MCH 19.5* 21.1*  --   --  20.8* 21.2* 21.2* 21.7*  MCHC 27.5* 29.2*  --   --  28.2* 28.6* 28.0* 28.1*  RDW 21.6* 23.7*  --   --  24.8* 26.3* 27.0* 27.9*  LYMPHSABS 1.7 1.1  --   --  1.3  --   --   --   MONOABS 1.0 0.8  --   --  1.3*  --   --   --   EOSABS 0.1 0.1  --   --  0.3  --   --   --   BASOSABS 0.0 0.0  --   --  0.0  --   --   --   < > = values in this interval not displayed.  Chemistries   Recent Labs Lab 09/17/16 0508 09/17/16 2320 09/18/16 0703 09/19/16 0759 09/20/16 0312  NA 139 138 139 135 137  K 3.6 4.0 3.6 3.8 4.4  CL 106 104 105 100* 104  CO2 '26 23 23 24 23  ' GLUCOSE 87 240* 156* 72 145*  BUN 41* 38* 38* 39* 44*  CREATININE 2.37* 2.41* 2.47* 2.52* 2.59*  CALCIUM 8.5* 8.6* 8.6* 8.5* 8.5*  AST 15  --   --  15  --   ALT 8*  --   --  10*  --   ALKPHOS 98  --   --  93  --   BILITOT 0.7  --   --  0.6  --    ------------------------------------------------------------------------------------------------------------------ No results for input(s): CHOL, HDL, LDLCALC, TRIG, CHOLHDL, LDLDIRECT in the last  72 hours.  Lab Results  Component Value Date   HGBA1C 5.9 (H) 09/16/2016   ------------------------------------------------------------------------------------------------------------------ No results for input(s): TSH, T4TOTAL, T3FREE, THYROIDAB in  the last 72 hours.  Invalid input(s): FREET3 ------------------------------------------------------------------------------------------------------------------ No results for input(s): VITAMINB12, FOLATE, FERRITIN, TIBC, IRON, RETICCTPCT in the last 72 hours.  Coagulation profile No results for input(s): INR, PROTIME in the last 168 hours.  No results for input(s): DDIMER in the last 72 hours.  Cardiac Enzymes No results for input(s): CKMB, TROPONINI, MYOGLOBIN in the last 168 hours.  Invalid input(s): CK ------------------------------------------------------------------------------------------------------------------    Component Value Date/Time   BNP 450.4 (H) 09/16/2016 2620    Micro Results Recent Results (from the past 240 hour(s))  Urine culture     Status: Abnormal   Collection Time: 09/17/16  6:45 AM  Result Value Ref Range Status   Specimen Description URINE, RANDOM  Final   Special Requests NONE  Final   Culture MULTIPLE SPECIES PRESENT, SUGGEST RECOLLECTION (A)  Final   Report Status 09/19/2016 FINAL  Final  Culture, blood (routine x 2)     Status: None (Preliminary result)   Collection Time: 09/17/16 11:24 PM  Result Value Ref Range Status   Specimen Description RIGHT ANTECUBITAL  Final   Special Requests   Final    BOTTLES DRAWN AEROBIC AND ANAEROBIC RED 6CC BLUE 10CC   Culture NO GROWTH 3 DAYS  Final   Report Status PENDING  Incomplete  Culture, blood (routine x 2)     Status: None (Preliminary result)   Collection Time: 09/17/16 11:32 PM  Result Value Ref Range Status   Specimen Description BLOOD RIGHT HAND  Final   Special Requests BOTTLES DRAWN AEROBIC AND ANAEROBIC  10CC EACH  Final   Culture NO GROWTH 3  DAYS  Final   Report Status PENDING  Incomplete    Radiology Reports Ct Abdomen Pelvis Wo Contrast  Result Date: 09/19/2016 CLINICAL DATA:  Fever.  History of diabetes. EXAM: CT ABDOMEN AND PELVIS WITHOUT CONTRAST TECHNIQUE: Multidetector CT imaging of the abdomen and pelvis was performed following the standard protocol without IV contrast. COMPARISON:  None. FINDINGS: Lower chest: Motion artifact limits evaluation of the lung bases but no focal consolidation noted. Small esophageal hiatal hernia. Hepatobiliary: No focal liver abnormality is seen. No gallstones, gallbladder wall thickening, or biliary dilatation. Pancreas: Unremarkable. No pancreatic ductal dilatation or surrounding inflammatory changes. Spleen: Normal in size without focal abnormality. Adrenals/Urinary Tract: Small stones in the kidneys without evidence of obstruction. Vascular calcifications also seen in the kidneys. No hydronephrosis or hydroureter. Bladder wall is not thickened. Stomach/Bowel: Stomach is within normal limits. Appendix appears normal. No evidence of bowel wall thickening, distention, or inflammatory changes. Vascular/Lymphatic: Calcific aortic atherosclerosis with mild aortic ectasia. Maximal AP diameter is 2.9 cm. Bilateral iliac artery aneurysms measuring 1.8 cm on the right and 1.7 cm on the left. No retroperitoneal hematoma. Scattered lymph nodes in the upper abdomen are not pathologically enlarged and are likely reactive. Reproductive: Uterus and bilateral adnexa are unremarkable. Other: No abdominal wall hernia or abnormality. No abdominopelvic ascites. Musculoskeletal: Degenerative changes in the spine. No destructive bone lesions. Note the examination is technically limited due to motion artifact. IMPRESSION: Small esophageal hiatal hernia. Tiny nonobstructing stones in the kidneys. Aortic atherosclerosis with ectatic aorta and bilateral iliac artery aneurysms. No acute process demonstrated. Electronically Signed    By: Lucienne Capers M.D.   On: 09/19/2016 02:55   Dg Chest 2 View  Result Date: 09/18/2016 CLINICAL DATA:  Patient with shortness of breath. History of congestive heart failure. EXAM: CHEST  2 VIEW COMPARISON:  Chest radiograph 09/17/2016. FINDINGS: Stable cardiomegaly status post median  sternotomy and CABG procedure. Pulmonary vascular redistribution. Mild bilateral perihilar interstitial opacities. Lung bases are excluded from view on lateral view. No pneumothorax. IMPRESSION: Cardiomegaly with mild interstitial edema. Electronically Signed   By: Lovey Newcomer M.D.   On: 09/18/2016 09:26   Mr Thoracic Spine Wo Contrast  Result Date: 09/20/2016 CLINICAL DATA:  Mid and low back pain for 2-3 months. No known injury. EXAM: MRI THORACIC AND LUMBAR SPINE WITHOUT CONTRAST TECHNIQUE: Multiplanar and multiecho pulse sequences of the thoracic and lumbar spine were obtained without intravenous contrast. COMPARISON:  New FINDINGS: MRI THORACIC SPINE FINDINGS The study is degraded by patient motion and the patient's size. Alignment:  Maintained. Vertebrae: No fracture is identified. Marrow signal is diffusely decreased on both T1 and T2 weighted imaging. Cord: The thoracic cord demonstrates normal signal. Focus of T2 hyperintensity centrally in the cord at C4-5 is likely artifactual. Paraspinal and other soft tissues: Unremarkable. Disc levels: Disc bulge at C4-5 appears to efface the ventral thecal sac and may deflect the cord. Shallow left paracentral protrusion T4-5 without central canal or foraminal narrowing. Shallow left paracentral protrusion T5-6 without central canal or foraminal narrowing. Shallow right paracentral protrusion T6-7 without central canal or foraminal narrowing. Shallow right paracentral protrusion T7-8 without central canal or foraminal narrowing. Except as described above, intervertebral disc spaces are unremarkable. MRI LUMBAR SPINE FINDINGS Segmentation:  Unremarkable. Alignment:  Maintained.  Vertebrae: No fracture. Decreased T1 and T2 signal seen. Degenerative endplate signal change Z6-X0 noted. Conus medullaris: Extends to the L2 level and appears normal. Paraspinal and other soft tissues: T2 hyperintense lesion the right kidney is likely a cyst. Disc levels: L1-2: Negative. L2-3: Very shallow central protrusion without central canal or foraminal narrowing. L3-4:  Negative. L4-5:  Mild facet degenerative change.  Otherwise negative. L5-S1: Eccentric to the left disc bulge with endplate spur. There is some narrowing in the left lateral recess and foramen. The right foramen is open. IMPRESSION: MR THORACIC SPINE IMPRESSION Degraded study due to the patient's size and motion. Decreased marrow signal is likely in part artifactual related to the patient's size and could also be due to obesity and/or cigarette smoking. Marrow infiltrative process is thought unlikely. Increased T2 signal in the cervical cord at C4-5 is most likely artifactual. If the patient has myelopathic symptoms, cervical spine MRI could be used for further evaluation. Mild thoracic spondylosis without central canal or foraminal stenosis. MR LUMBAR SPINE IMPRESSION Degraded study due to patient motion and size. Decreased marrow signal in all imaged bones is likely in part artifactual and could also be due to obesity or cigarette smoking. Marrow infiltrative process is thought unlikely. Spondylosis at L5-S1 where there is narrowing in the left lateral recess and foramen without obvious nerve root compression. Electronically Signed   By: Inge Rise M.D.   On: 09/20/2016 16:33   Mr Lumbar Spine Wo Contrast  Result Date: 09/20/2016 CLINICAL DATA:  Mid and low back pain for 2-3 months. No known injury. EXAM: MRI THORACIC AND LUMBAR SPINE WITHOUT CONTRAST TECHNIQUE: Multiplanar and multiecho pulse sequences of the thoracic and lumbar spine were obtained without intravenous contrast. COMPARISON:  New FINDINGS: MRI THORACIC SPINE  FINDINGS The study is degraded by patient motion and the patient's size. Alignment:  Maintained. Vertebrae: No fracture is identified. Marrow signal is diffusely decreased on both T1 and T2 weighted imaging. Cord: The thoracic cord demonstrates normal signal. Focus of T2 hyperintensity centrally in the cord at C4-5 is likely artifactual. Paraspinal and other soft  tissues: Unremarkable. Disc levels: Disc bulge at C4-5 appears to efface the ventral thecal sac and may deflect the cord. Shallow left paracentral protrusion T4-5 without central canal or foraminal narrowing. Shallow left paracentral protrusion T5-6 without central canal or foraminal narrowing. Shallow right paracentral protrusion T6-7 without central canal or foraminal narrowing. Shallow right paracentral protrusion T7-8 without central canal or foraminal narrowing. Except as described above, intervertebral disc spaces are unremarkable. MRI LUMBAR SPINE FINDINGS Segmentation:  Unremarkable. Alignment:  Maintained. Vertebrae: No fracture. Decreased T1 and T2 signal seen. Degenerative endplate signal change Z6-X0 noted. Conus medullaris: Extends to the L2 level and appears normal. Paraspinal and other soft tissues: T2 hyperintense lesion the right kidney is likely a cyst. Disc levels: L1-2: Negative. L2-3: Very shallow central protrusion without central canal or foraminal narrowing. L3-4:  Negative. L4-5:  Mild facet degenerative change.  Otherwise negative. L5-S1: Eccentric to the left disc bulge with endplate spur. There is some narrowing in the left lateral recess and foramen. The right foramen is open. IMPRESSION: MR THORACIC SPINE IMPRESSION Degraded study due to the patient's size and motion. Decreased marrow signal is likely in part artifactual related to the patient's size and could also be due to obesity and/or cigarette smoking. Marrow infiltrative process is thought unlikely. Increased T2 signal in the cervical cord at C4-5 is most likely  artifactual. If the patient has myelopathic symptoms, cervical spine MRI could be used for further evaluation. Mild thoracic spondylosis without central canal or foraminal stenosis. MR LUMBAR SPINE IMPRESSION Degraded study due to patient motion and size. Decreased marrow signal in all imaged bones is likely in part artifactual and could also be due to obesity or cigarette smoking. Marrow infiltrative process is thought unlikely. Spondylosis at L5-S1 where there is narrowing in the left lateral recess and foramen without obvious nerve root compression. Electronically Signed   By: Inge Rise M.D.   On: 09/20/2016 16:33   Dg Chest Port 1 View  Result Date: 09/17/2016 CLINICAL DATA:  Fever and shortness of breath. EXAM: PORTABLE CHEST 1 VIEW COMPARISON:  09/16/2016 FINDINGS: Postoperative changes in the mediastinum. Cardiac enlargement with mild pulmonary vascular congestion. Hazy interstitial opacities in the mid lungs likely representing early interstitial edema. No blunting of costophrenic angles. No pneumothorax. No focal consolidation. IMPRESSION: Cardiac enlargement with pulmonary vascular congestion and mild interstitial edema. Electronically Signed   By: Lucienne Capers M.D.   On: 09/17/2016 23:43   Dg Chest Port 1 View  Result Date: 09/16/2016 CLINICAL DATA:  Shortness of Breath EXAM: PORTABLE CHEST 1 VIEW COMPARISON:  April 17, 2016 FINDINGS: There is no edema or consolidation. Heart is borderline enlarged with pulmonary venous hypertension. Patient is status post coronary artery bypass grafting. No adenopathy. No bone lesions. IMPRESSION: Pulmonary vascular congestion without frank edema or consolidation. Stable cardiac silhouette. Electronically Signed   By: Lowella Grip III M.D.   On: 09/16/2016 08:07    Time Spent in minutes  30   SINGH,PRASHANT K M.D on 09/22/2016 at 10:32 AM  Between 7am to 7pm - Pager - [phone]  After 7pm go to [email or web address] - password [facility]  [facility] -  Office  [phone]

## 2016-09-22 NOTE — Progress Notes (Signed)
INFECTIOUS DISEASE PROGRESS NOTE  ID: [name] [name] is a 65 y.o. female with  Active Problems:   Glaucoma   Essential hypertension   S/P CABG (coronary artery bypass graft)   Type 2 diabetes mellitus with renal manifestations, controlled (HCC)   CKD (chronic kidney disease) stage 4, GFR 15-29 ml/min (HCC)   Obesity   Symptomatic anemia   Diabetes mellitus with complication (HCC)  Subjective: Without complaints. No blood in urine or BM.   Abtx:  Anti-infectives    None      Medications:  Scheduled: . sodium chloride   Intravenous Once  . aspirin  325 mg Oral Daily  . brinzolamide  1 drop Both Eyes TID  . buPROPion  150 mg Oral BID  . ferrous sulfate  325 mg Oral BID WC  . gabapentin  300 mg Oral QHS  . [START ON 09/23/2016] heparin  5,000 Units Subcutaneous Q8H  . insulin aspart  0-5 Units Subcutaneous QHS  . insulin aspart  0-9 Units Subcutaneous TID WC  . insulin glargine  30 Units Subcutaneous QHS  . latanoprost  1 drop Both Eyes QHS  . loratadine  10 mg Oral Daily  . LORazepam  1 mg Intravenous Once  . metoprolol tartrate  12.5 mg Oral BID  . mometasone-formoterol  2 puff Inhalation BID  . pantoprazole  40 mg Oral Daily  . pravastatin  40 mg Oral QHS  . sodium chloride flush  3 mL Intravenous Q12H  . traMADol  50 mg Oral TID    Objective: Vital signs in last 24 hours: Temp:  [98.7 F (37.1 C)-99.3 F (37.4 C)] 99.1 F (37.3 C) (09/25 0840) Pulse Rate:  [73-79] 76 (09/25 0941) Resp:  [17-21] 17 (09/25 0840) BP: (114-131)/(53-64) 115/53 (09/25 0941) SpO2:  [91 %-100 %] 100 % (09/25 0840) Weight:  [134.8 kg (297 lb 3.2 oz)] 134.8 kg (297 lb 3.2 oz) (09/25 0500)   General appearance: alert, cooperative and no distress Resp: clear to auscultation bilaterally Cardio: regular rate and rhythm GI: normal findings: bowel sounds normal and soft, non-tender  Lab Results  Recent Labs  09/20/16 0312 09/21/16 0425 09/22/16 0854  WBC 10.3 10.5 9.0  HGB  6.9* 7.2* 6.8*  HCT 24.1* 25.7* 24.2*  NA 137  --   --   K 4.4  --   --   CL 104  --   --   CO2 23  --   --   BUN 44*  --   --   CREATININE 2.59*  --   --    Liver Panel No results for input(s): PROT, ALBUMIN, AST, ALT, ALKPHOS, BILITOT, BILIDIR, IBILI in the last 72 hours. Sedimentation Rate No results for input(s): ESRSEDRATE in the last 72 hours. C-Reactive Protein No results for input(s): CRP in the last 72 hours.  Microbiology: Recent Results (from the past 240 hour(s))  Urine culture     Status: Abnormal   Collection Time: 09/17/16  6:45 AM  Result Value Ref Range Status   Specimen Description URINE, RANDOM  Final   Special Requests NONE  Final   Culture MULTIPLE SPECIES PRESENT, SUGGEST RECOLLECTION (A)  Final   Report Status 09/19/2016 FINAL  Final  Culture, blood (routine x 2)     Status: None (Preliminary result)   Collection Time: 09/17/16 11:24 PM  Result Value Ref Range Status   Specimen Description RIGHT ANTECUBITAL  Final   Special Requests   Final    BOTTLES DRAWN  AEROBIC AND ANAEROBIC RED 6CC BLUE 10CC   Culture NO GROWTH 3 DAYS  Final   Report Status PENDING  Incomplete  Culture, blood (routine x 2)     Status: None (Preliminary result)   Collection Time: 09/17/16 11:32 PM  Result Value Ref Range Status   Specimen Description BLOOD RIGHT HAND  Final   Special Requests BOTTLES DRAWN AEROBIC AND ANAEROBIC  10CC EACH  Final   Culture NO GROWTH 3 DAYS  Final   Report Status PENDING  Incomplete    Studies/Results: Mr Thoracic Spine Wo Contrast  Result Date: 09/20/2016 CLINICAL DATA:  Mid and low back pain for 2-3 months. No known injury. EXAM: MRI THORACIC AND LUMBAR SPINE WITHOUT CONTRAST TECHNIQUE: Multiplanar and multiecho pulse sequences of the thoracic and lumbar spine were obtained without intravenous contrast. COMPARISON:  New FINDINGS: MRI THORACIC SPINE FINDINGS The study is degraded by patient motion and the patient's size. Alignment:  Maintained.  Vertebrae: No fracture is identified. Marrow signal is diffusely decreased on both T1 and T2 weighted imaging. Cord: The thoracic cord demonstrates normal signal. Focus of T2 hyperintensity centrally in the cord at C4-5 is likely artifactual. Paraspinal and other soft tissues: Unremarkable. Disc levels: Disc bulge at C4-5 appears to efface the ventral thecal sac and may deflect the cord. Shallow left paracentral protrusion T4-5 without central canal or foraminal narrowing. Shallow left paracentral protrusion T5-6 without central canal or foraminal narrowing. Shallow right paracentral protrusion T6-7 without central canal or foraminal narrowing. Shallow right paracentral protrusion T7-8 without central canal or foraminal narrowing. Except as described above, intervertebral disc spaces are unremarkable. MRI LUMBAR SPINE FINDINGS Segmentation:  Unremarkable. Alignment:  Maintained. Vertebrae: No fracture. Decreased T1 and T2 signal seen. Degenerative endplate signal change I1-W4 noted. Conus medullaris: Extends to the L2 level and appears normal. Paraspinal and other soft tissues: T2 hyperintense lesion the right kidney is likely a cyst. Disc levels: L1-2: Negative. L2-3: Very shallow central protrusion without central canal or foraminal narrowing. L3-4:  Negative. L4-5:  Mild facet degenerative change.  Otherwise negative. L5-S1: Eccentric to the left disc bulge with endplate spur. There is some narrowing in the left lateral recess and foramen. The right foramen is open. IMPRESSION: MR THORACIC SPINE IMPRESSION Degraded study due to the patient's size and motion. Decreased marrow signal is likely in part artifactual related to the patient's size and could also be due to obesity and/or cigarette smoking. Marrow infiltrative process is thought unlikely. Increased T2 signal in the cervical cord at C4-5 is most likely artifactual. If the patient has myelopathic symptoms, cervical spine MRI could be used for further  evaluation. Mild thoracic spondylosis without central canal or foraminal stenosis. MR LUMBAR SPINE IMPRESSION Degraded study due to patient motion and size. Decreased marrow signal in all imaged bones is likely in part artifactual and could also be due to obesity or cigarette smoking. Marrow infiltrative process is thought unlikely. Spondylosis at L5-S1 where there is narrowing in the left lateral recess and foramen without obvious nerve root compression. Electronically Signed   By: Inge Rise M.D.   On: 09/20/2016 16:33   Mr Lumbar Spine Wo Contrast  Result Date: 09/20/2016 CLINICAL DATA:  Mid and low back pain for 2-3 months. No known injury. EXAM: MRI THORACIC AND LUMBAR SPINE WITHOUT CONTRAST TECHNIQUE: Multiplanar and multiecho pulse sequences of the thoracic and lumbar spine were obtained without intravenous contrast. COMPARISON:  New FINDINGS: MRI THORACIC SPINE FINDINGS The study is degraded by patient motion  and the patient's size. Alignment:  Maintained. Vertebrae: No fracture is identified. Marrow signal is diffusely decreased on both T1 and T2 weighted imaging. Cord: The thoracic cord demonstrates normal signal. Focus of T2 hyperintensity centrally in the cord at C4-5 is likely artifactual. Paraspinal and other soft tissues: Unremarkable. Disc levels: Disc bulge at C4-5 appears to efface the ventral thecal sac and may deflect the cord. Shallow left paracentral protrusion T4-5 without central canal or foraminal narrowing. Shallow left paracentral protrusion T5-6 without central canal or foraminal narrowing. Shallow right paracentral protrusion T6-7 without central canal or foraminal narrowing. Shallow right paracentral protrusion T7-8 without central canal or foraminal narrowing. Except as described above, intervertebral disc spaces are unremarkable. MRI LUMBAR SPINE FINDINGS Segmentation:  Unremarkable. Alignment:  Maintained. Vertebrae: No fracture. Decreased T1 and T2 signal seen.  Degenerative endplate signal change H2-Z2 noted. Conus medullaris: Extends to the L2 level and appears normal. Paraspinal and other soft tissues: T2 hyperintense lesion the right kidney is likely a cyst. Disc levels: L1-2: Negative. L2-3: Very shallow central protrusion without central canal or foraminal narrowing. L3-4:  Negative. L4-5:  Mild facet degenerative change.  Otherwise negative. L5-S1: Eccentric to the left disc bulge with endplate spur. There is some narrowing in the left lateral recess and foramen. The right foramen is open. IMPRESSION: MR THORACIC SPINE IMPRESSION Degraded study due to the patient's size and motion. Decreased marrow signal is likely in part artifactual related to the patient's size and could also be due to obesity and/or cigarette smoking. Marrow infiltrative process is thought unlikely. Increased T2 signal in the cervical cord at C4-5 is most likely artifactual. If the patient has myelopathic symptoms, cervical spine MRI could be used for further evaluation. Mild thoracic spondylosis without central canal or foraminal stenosis. MR LUMBAR SPINE IMPRESSION Degraded study due to patient motion and size. Decreased marrow signal in all imaged bones is likely in part artifactual and could also be due to obesity or cigarette smoking. Marrow infiltrative process is thought unlikely. Spondylosis at L5-S1 where there is narrowing in the left lateral recess and foramen without obvious nerve root compression. Electronically Signed   By: Inge Rise M.D.   On: 09/20/2016 16:33     Assessment/Plan: Fever  Anemia  Total days of antibiotics: none        Afebrile today Await her bone marrow bx Will follow    Bobby Rumpf Infectious Diseases (pager) [phone] [email or web address] 09/22/2016, 11:27 AM  LOS: 5 days

## 2016-09-22 NOTE — Care Management Important Message (Signed)
Important Message  Patient Details  Name: [name] [name] MRN: [id number] Date of Birth: 12-Apr-1951   Medicare Important Message Given:  Yes    [name] Abena 09/22/2016, 11:21 AM

## 2016-09-22 NOTE — Progress Notes (Addendum)
CRITICAL VALUE ALERT  Critical value received:  Hemaglobin=6.8  Date of notification:  09/22/2016  Time of notification: 1003  Critical value read back:  yes  Nurse who received alert:  Nona Dell RN  MD notified (1st page):  Dr. Candiss Norse  Time of first page:  1012  MD notified (2nd page):  Time of second page:  Responding MD:  Dr. Candiss Norse  Time MD responded:  1012.  Dr Candiss Norse stated that he would order 1 unit of blood.

## 2016-09-23 ENCOUNTER — Inpatient Hospital Stay (HOSPITAL_COMMUNITY): Payer: Medicare Other

## 2016-09-23 ENCOUNTER — Other Ambulatory Visit (HOSPITAL_COMMUNITY): Payer: Medicare Other

## 2016-09-23 LAB — CULTURE, BLOOD (ROUTINE X 2)
CULTURE: NO GROWTH
CULTURE: NO GROWTH

## 2016-09-23 LAB — GLUCOSE, CAPILLARY
GLUCOSE-CAPILLARY: 78 mg/dL (ref 65–99)
GLUCOSE-CAPILLARY: 75 mg/dL (ref 65–99)

## 2016-09-23 LAB — PROTIME-INR
INR: 1.17
Prothrombin Time: 15 seconds (ref 11.4–15.2)

## 2016-09-23 LAB — CBC
HEMATOCRIT: 27.1 % — AB (ref 36.0–46.0)
HEMOGLOBIN: 7.4 g/dL — AB (ref 12.0–15.0)
MCH: 21.2 pg — ABNORMAL LOW (ref 26.0–34.0)
MCHC: 27.3 g/dL — ABNORMAL LOW (ref 30.0–36.0)
MCV: 77.7 fL — ABNORMAL LOW (ref 78.0–100.0)
Platelets: 492 10*3/uL — ABNORMAL HIGH (ref 150–400)
RBC: 3.49 MIL/uL — AB (ref 3.87–5.11)
RDW: 27.6 % — ABNORMAL HIGH (ref 11.5–15.5)
WBC: 8.6 10*3/uL (ref 4.0–10.5)

## 2016-09-23 LAB — APTT: APTT: 35 s (ref 24–36)

## 2016-09-23 LAB — BONE MARROW EXAM

## 2016-09-23 MED ORDER — FENTANYL CITRATE (PF) 100 MCG/2ML IJ SOLN
INTRAMUSCULAR | Status: AC
  Filled 2016-09-23: qty 4

## 2016-09-23 MED ORDER — LIDOCAINE HCL 1 % IJ SOLN
INTRAMUSCULAR | Status: AC
  Filled 2016-09-23: qty 20

## 2016-09-23 MED ORDER — HEPARIN SODIUM (PORCINE) 5000 UNIT/ML IJ SOLN: 5000.0000 [IU] | Freq: Three times a day (TID) | INTRAMUSCULAR | Status: DC

## 2016-09-23 MED ORDER — MIDAZOLAM HCL 2 MG/2ML IJ SOLN
INTRAMUSCULAR | Status: AC | PRN
  Administered 2016-09-23: 1 mg via INTRAVENOUS

## 2016-09-23 MED ORDER — FENTANYL CITRATE (PF) 100 MCG/2ML IJ SOLN
INTRAMUSCULAR | Status: AC | PRN
  Administered 2016-09-23: 50 ug via INTRAVENOUS

## 2016-09-23 MED ORDER — PANTOPRAZOLE SODIUM 40 MG PO TBEC: 40.0000 mg | DELAYED_RELEASE_TABLET | Freq: Every day | ORAL | 0 refills | Status: DC

## 2016-09-23 MED ORDER — MIDAZOLAM HCL 2 MG/2ML IJ SOLN
INTRAMUSCULAR | Status: AC
  Filled 2016-09-23: qty 4

## 2016-09-23 NOTE — Sedation Documentation (Signed)
Biopsy taken.

## 2016-09-23 NOTE — Sedation Documentation (Signed)
Patient is resting comfortably. 

## 2016-09-23 NOTE — Discharge Instructions (Addendum)
Follow with Primary MD Rogers Blocker, MD in 5-7 days, follow Bone Marrow Biopsy results and get CBC, CMP, 2 view Chest X ray checked     Activity: As tolerated with Full fall precautions use walker/cane & assistance as needed  Disposition Home    Diet:   Heart Healthy Low carb.  Accuchecks 4 times/day, Once in AM empty stomach and then before each meal. Log in all results and show them to your Prim.MD in 3 days. If any glucose reading is under 80 or above 300 call your Prim MD immidiately. Follow Low glucose instructions for glucose under 80 as instructed.   For Heart failure patients - Check your Weight same time everyday, if you gain over 2 pounds, or you develop in leg swelling, experience more shortness of breath or chest pain, call your Primary MD immediately. Follow Cardiac Low Salt Diet and 1.5 lit/day fluid restriction.   On your next visit with your primary care physician please Get Medicines reviewed and adjusted.   Please request your Prim.MD to go over all Hospital Tests and Procedure/Radiological results at the follow up, please get all Hospital records sent to your Prim MD by signing hospital release before you go home.   If you experience worsening of your admission symptoms, develop shortness of breath, life threatening emergency, suicidal or homicidal thoughts you must seek medical attention immediately by calling 911 or calling your MD immediately  if symptoms less severe.  You Must read complete instructions/literature along with all the possible adverse reactions/side effects for all the Medicines you take and that have been prescribed to you. Take any new Medicines after you have completely understood and accpet all the possible adverse reactions/side effects.   Do not drive, operate heavy machinery, perform activities at heights, swimming or participation in water activities or provide baby sitting services if your were admitted for syncope or siezures until you  have seen by Primary MD or a Neurologist and advised to do so again.  Do not drive when taking Pain medications.    Do not take more than prescribed Pain, Sleep and Anxiety Medications  Special Instructions: If you have smoked or chewed Tobacco  in the last 2 yrs please stop smoking, stop any regular Alcohol  and or any Recreational drug use.  Wear Seat belts while driving.   Please note  You were cared for by a hospitalist during your hospital stay. If you have any questions about your discharge medications or the care you received while you were in the hospital after you are discharged, you can call the unit and asked to speak with the hospitalist on call if the hospitalist that took care of you is not available. Once you are discharged, your primary care physician will handle any further medical issues. Please note that NO REFILLS for any discharge medications will be authorized once you are discharged, as it is imperative that you return to your primary care physician (or establish a relationship with a primary care physician if you do not have one) for your aftercare needs so that they can reassess your need for medications and monitor your lab values.   Bone Marrow Aspiration and Bone Marrow Biopsy, Care After Refer to this sheet in the next few weeks. These instructions provide you with information about caring for yourself after your procedure. Your health care provider may also give you more specific instructions. Your treatment has been planned according to current medical practices, but problems sometimes occur.  Call your health care provider if you have any problems or questions after your procedure. WHAT TO EXPECT AFTER THE PROCEDURE After your procedure, it is common to have:  Soreness or tenderness around the puncture site.  Bruising. HOME CARE INSTRUCTIONS  Take medicines only as directed by your health care provider.  Follow your health care provider's instructions  about:  Puncture site care.  Bandage (dressing) changes and removal.  Bathe and shower as directed by your health care provider.  Check your puncture site every day for signs of infection. Watch for:  Redness, swelling, or pain.  Fluid, blood, or pus.  Return to your normal activities as directed by your health care provider.  Keep all follow-up visits as directed by your health care provider. This is important. SEEK MEDICAL CARE IF:  You have a fever.  You have uncontrollable bleeding.  You have redness, swelling, or pain at the site of your puncture.  You have fluid, blood, or pus coming from your puncture site.   This information is not intended to replace advice given to you by your health care provider. Make sure you discuss any questions you have with your health care provider.   Document Released: 07/04/2005 Document Revised: 05/01/2015 Document Reviewed: 12/06/2014 Elsevier Interactive Patient Education 2016 [employer].   Diabetes Mellitus and Food It is important for you to manage your blood sugar (glucose) level. Your blood glucose level can be greatly affected by what you eat. Eating healthier foods in the appropriate amounts throughout the day at about the same time each day will help you control your blood glucose level. It can also help slow or prevent worsening of your diabetes mellitus. Healthy eating may even help you improve the level of your blood pressure and reach or maintain a healthy weight.  General recommendations for healthful eating and cooking habits include:  Eating meals and snacks regularly. Avoid going long periods of time without eating to lose weight.  Eating a diet that consists mainly of plant-based foods, such as fruits, vegetables, nuts, legumes, and whole grains.  Using low-heat cooking methods, such as baking, instead of high-heat cooking methods, such as deep frying. Work with your dietitian to make sure you understand how to use  the Nutrition Facts information on food labels. HOW CAN FOOD AFFECT ME? Carbohydrates Carbohydrates affect your blood glucose level more than any other type of food. Your dietitian will help you determine how many carbohydrates to eat at each meal and teach you how to count carbohydrates. Counting carbohydrates is important to keep your blood glucose at a healthy level, especially if you are using insulin or taking certain medicines for diabetes mellitus. Alcohol Alcohol can cause sudden decreases in blood glucose (hypoglycemia), especially if you use insulin or take certain medicines for diabetes mellitus. Hypoglycemia can be a life-threatening condition. Symptoms of hypoglycemia (sleepiness, dizziness, and disorientation) are similar to symptoms of having too much alcohol.  If your health care provider has given you approval to drink alcohol, do so in moderation and use the following guidelines:  Women should not have more than one drink per day, and men should not have more than two drinks per day. One drink is equal to:  12 oz of beer.  5 oz of wine.  1 oz of hard liquor.  Do not drink on an empty stomach.  Keep yourself hydrated. Have water, diet soda, or unsweetened iced tea.  Regular soda, juice, and other mixers might contain a lot of carbohydrates  and should be counted. WHAT FOODS ARE NOT RECOMMENDED? As you make food choices, it is important to remember that all foods are not the same. Some foods have fewer nutrients per serving than other foods, even though they might have the same number of calories or carbohydrates. It is difficult to get your body what it needs when you eat foods with fewer nutrients. Examples of foods that you should avoid that are high in calories and carbohydrates but low in nutrients include:  Trans fats (most processed foods list trans fats on the Nutrition Facts label).  Regular soda.  Juice.  Candy.  Sweets, such as cake, pie, doughnuts, and  cookies.  Fried foods. WHAT FOODS CAN I EAT? Eat nutrient-rich foods, which will nourish your body and keep you healthy. The food you should eat also will depend on several factors, including:  The calories you need.  The medicines you take.  Your weight.  Your blood glucose level.  Your blood pressure level.  Your cholesterol level. You should eat a variety of foods, including:  Protein.  Lean cuts of meat.  Proteins low in saturated fats, such as fish, egg whites, and beans. Avoid processed meats.  Fruits and vegetables.  Fruits and vegetables that may help control blood glucose levels, such as apples, mangoes, and yams.  Dairy products.  Choose fat-free or low-fat dairy products, such as milk, yogurt, and cheese.  Grains, bread, pasta, and rice.  Choose whole grain products, such as multigrain bread, whole oats, and brown rice. These foods may help control blood pressure.  Fats.  Foods containing healthful fats, such as nuts, avocado, olive oil, canola oil, and fish. DOES EVERYONE WITH DIABETES MELLITUS HAVE THE SAME MEAL PLAN? Because every person with diabetes mellitus is different, there is not one meal plan that works for everyone. It is very important that you meet with a dietitian who will help you create a meal plan that is just right for you.   This information is not intended to replace advice given to you by your health care provider. Make sure you discuss any questions you have with your health care provider.   Document Released: 09/11/2005 Document Revised: 01/05/2015 Document Reviewed: 11/11/2013 Elsevier Interactive Patient Education [employer].

## 2016-09-23 NOTE — Sedation Documentation (Signed)
Awaiting MD and CT availability

## 2016-09-23 NOTE — Discharge Summary (Signed)
[name] [name] CMK:[id number] DOB: 08-29-1951 DOA: 09/16/2016  PCP: Rogers Blocker, MD  Admit date: 09/16/2016  Discharge date: 09/23/2016  Admitted From: Home   Disposition:  Home   Recommendations for Outpatient Follow-up:   Follow up with PCP in 1-2 weeks  PCP Please obtain BMP/CBC, 2 view CXR in 1week,  (see Discharge instructions)   PCP Please follow up on the following pending results: Bone marrow biopsy results   Home Health: None  Equipment/Devices: None  Consultations: ID,IR Discharge Condition: Stable   CODE STATUS: Full   Diet Recommendation: Heart Healthy Low carb   Chief Complaint  Patient presents with  . 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 Of note patient has had iron deficiency anemia for which she has been worked up by Dr. Carlean Purl with unremarkable EGD and colonoscopy, in the ER she was diagnosed with severe and efficiency anemia with a hemoglobin of 5.3, she was transfused 2 units of packed RBC and placed on iron with stable H&H at this time.   However she has developed fevers ongoing for 3-4 days as it has 103 with no obvious source, we have checked a CT scan abdomen and pelvis, multiple chest x-rays, UA, MRI T and L-spine which have all been unremarkable, her ESR and CRP are significantly elevated, LDH was borderline, haptoglobin was high,  . ID to evaluate for further workup. Patient has a nontoxic appearance. She  has been off antibiotics.  Hospital issues addressed    1. Acute on chronic combined systolic and diastolic CHF. EF 40%. Exacerbated by acute on chronic symptomatically microcytic anemia - we'll transfuse, continue IV Lasix, already much improved, monitor intake and output and daily weights.  2. Recurrent microcytic iron deficiency anemia. Has had workup with Dr. Carlean Purl recently in April with EGD and colonoscopy, no frank source, Have placed her on PPI, may require small bowel evaluation in the outpatient setting, transfused total 3 units of packed RBC this admission with stable H&H, placed on oral iron, follow with Dr. Carlean Purl. Haptoglobin is high which goes against hemolysis, LDH mildly high, peripheral smear showed anemia with ansocytosis . Bone marrow biopsy done today results pending requested patient to follow with both PCP and hematology.  3. Recurrent Fevers. Chest x-ray, UA nonacute, have added IS & requested to sit up in the chair, Blood Cultures pending but thus far negative, CT Abd-pelvis non acute, she has high ESR-CRP and LDH, only possible source is chronic low back pain ongoing for one year, we checked MRI of T and L-spine which were also unremarkable along with lower extremity venous duplex which was negative as well. Acute hepatitis panel and HIV serology  negative.  ID was consulted, but ID patient underwent bone marrow biopsy today by IR, now afebrile for 24 hours and continues to be nontoxic in appearance, all cultures remain negative to date, will be discharged home with outpatient follow-up with ID, hematology and PCP.  4. Morbid Obesity. Follow with PCP for weight loss.  5. CK D stage IV. Baseline creatinine close to 2.4. Close to baseline.   6. History of CVA with some left-sided mild deficits. Continue aspirin and statin for secondary prevention.   7. Anxiety depression. Stable on albuterol.   8. Diabetic peripheral neuropathy. On Neurontin.   9. Hypertension.  On beta blocker.  10. DM type II. Continue home regimen follow with PCP.    Discharge diagnosis     Active Problems:   Glaucoma   Essential hypertension   S/P CABG (coronary artery bypass graft)   Type 2 diabetes mellitus with renal manifestations, controlled (HCC)   CKD (chronic kidney disease) stage 4, GFR 15-29 ml/min (HCC)   Obesity   Symptomatic anemia   Diabetes mellitus with complication ([place])   Fever    Discharge instructions    Discharge Instructions    Discharge instructions    Complete by:  As directed    Follow with Primary MD Rogers Blocker, MD in 5-7 days, follow Bone Marrow Biopsy results and get CBC, CMP, 2 view Chest X ray checked     Activity: As tolerated with Full fall precautions use walker/cane & assistance as needed  Disposition Home    Diet:   Heart Healthy Low carb.  Accuchecks 4 times/day, Once in AM empty stomach and then before each meal. Log in all results and show them to your Prim.MD in 3 days. If any glucose reading is under 80 or above 300 call your Prim MD immidiately. Follow Low glucose instructions for glucose under 80 as instructed.   For Heart failure patients - Check your Weight same time everyday, if you gain over 2 pounds, or you develop in leg swelling, experience more shortness of breath or chest pain, call your Primary MD immediately. Follow Cardiac Low Salt Diet and 1.5 lit/day fluid restriction.   On your next visit with your primary care physician please Get Medicines reviewed and adjusted.   Please request your Prim.MD to go over all Hospital Tests and Procedure/Radiological results at the follow up, please get all Hospital records sent to your Prim MD by signing hospital release before you go home.   If you experience worsening of your admission symptoms, develop shortness of breath, life threatening emergency, suicidal or homicidal thoughts you must seek medical attention immediately by calling 911 or calling your MD  immediately  if symptoms less severe.  You Must read complete instructions/literature along with all the possible adverse reactions/side effects for all the Medicines you take and that have been prescribed to you. Take any new Medicines after you have completely understood and accpet all the possible adverse reactions/side effects.   Do not drive, operate heavy machinery, perform activities at heights, swimming or participation in water activities or provide baby sitting services if your were admitted for syncope or siezures until you have seen by Primary MD or a Neurologist and advised to do so again.  Do not drive when taking Pain medications.    Do not take more than prescribed Pain, Sleep and Anxiety Medications  Special Instructions: If you have smoked or chewed Tobacco  in the last 2 yrs please stop smoking, stop any regular  Alcohol  and or any Recreational drug use.  Wear Seat belts while driving.   Please note  You were cared for by a hospitalist during your hospital stay. If you have any questions about your discharge medications or the care you received while you were in the hospital after you are discharged, you can call the unit and asked to speak with the hospitalist on call if the hospitalist that took care of you is not available. Once you are discharged, your primary care physician will handle any further medical issues. Please note that NO REFILLS for any discharge medications will be authorized once you are discharged, as it is imperative that you return to your primary care physician (or establish a relationship with a primary care physician if you do not have one) for your aftercare needs so that they can reassess your need for medications and monitor your lab values.   Increase activity slowly    Complete by:  As directed       Discharge Medications     Medication List    TAKE these medications   aspirin 325 MG tablet Take 325 mg by mouth daily. What changed:   Another medication with the same name was removed. Continue taking this medication, and follow the directions you see here.   brinzolamide 1 % ophthalmic suspension Commonly known as:  AZOPT Place 1 drop into both eyes 3 (three) times daily.   buPROPion 150 MG 12 hr tablet Commonly known as:  WELLBUTRIN SR Take 1 tablet (150 mg total) by mouth 2 (two) times daily.   cetirizine 10 MG tablet Commonly known as:  ZYRTEC Take 10 mg by mouth daily as needed for allergies.   clotrimazole-betamethasone cream Commonly known as:  LOTRISONE Apply 1 application topically 2 (two) times daily. On left hand between fingers   ferrous sulfate 325 (65 FE) MG tablet Take 1 tablet (325 mg total) by mouth 2 (two) times daily with a meal.   Fluticasone-Salmeterol 250-50 MCG/DOSE Aepb Commonly known as:  ADVAIR Inhale 1 puff into the lungs every 12 (twelve) hours.   furosemide 40 MG tablet Commonly known as:  LASIX Take 1 tablet (40 mg total) by mouth daily. Take additional dose if you experience weight gain.   gabapentin 300 MG capsule Commonly known as:  NEURONTIN Take 300 mg by mouth at bedtime.   glimepiride 4 MG tablet Commonly known as:  AMARYL Take 4 mg by mouth daily.   Insulin Glargine 100 UNIT/ML Solostar Pen Commonly known as:  LANTUS SOLOSTAR Inject 25 Units into the skin daily at 10 pm.   latanoprost 0.005 % ophthalmic solution Commonly known as:  XALATAN Place 1 drop into both eyes at bedtime.   metoprolol tartrate 25 MG tablet Commonly known as:  LOPRESSOR Take 0.5 tablets (12.5 mg total) by mouth 2 (two) times daily.   pantoprazole 40 MG tablet Commonly known as:  PROTONIX Take 1 tablet (40 mg total) by mouth daily.   pravastatin 40 MG tablet Commonly known as:  PRAVACHOL Take 40 mg by mouth at bedtime.   traMADol 50 MG tablet Commonly known as:  ULTRAM Take 50 mg by mouth 3 (three) times daily.       Follow-up Information    Rogers Blocker, MD. Schedule an  appointment as soon as possible for a visit in 1 week(s).   Specialty:  Internal Medicine Contact information: [address] [place] [address] [phone]        [name]  Carlean Purl, MD. Schedule an appointment as soon as possible for a visit in 1 week(s).   Specialty:  Gastroenterology Why:  Anemia Contact information: [address] [place] [address] [phone]        Heath Lark, MD. Schedule an appointment as soon as possible for a visit in 1 week(s).   Specialty:  Hematology and Oncology Why:  Anemia, FUO Contact information: [address] [place] [address] [phone]        Bobby Rumpf, MD. Schedule an appointment as soon as possible for a visit in 1 week(s).   Specialty:  Infectious Diseases Contact information: [place] [address] [phone]           Major procedures and Radiology Reports - PLEASE review detailed and final reports thoroughly  -       Vascular Ultrasound Lower extremity venous duplexhas been completed. Preliminary findings: No evidence of DVT or baker's cyst.  MRI T and L-spine. Nonacute.  CT scan abdomen and pelvis. Nonacute  Bone marrow biopsy. Results pending.   Ct Abdomen Pelvis Wo Contrast  Result Date: 09/19/2016 CLINICAL DATA:  Fever.  History of diabetes. EXAM: CT ABDOMEN AND PELVIS WITHOUT CONTRAST TECHNIQUE: Multidetector CT imaging of the abdomen and pelvis was performed following the standard protocol without IV contrast. COMPARISON:  None. FINDINGS: Lower chest: Motion artifact limits evaluation of the lung bases but no focal consolidation noted. Small esophageal hiatal hernia. Hepatobiliary: No focal liver abnormality is seen. No gallstones, gallbladder wall thickening, or biliary dilatation. Pancreas: Unremarkable. No pancreatic ductal dilatation or surrounding inflammatory changes. Spleen: Normal in size without focal abnormality. Adrenals/Urinary Tract:  Small stones in the kidneys without evidence of obstruction. Vascular calcifications also seen in the kidneys. No hydronephrosis or hydroureter. Bladder wall is not thickened. Stomach/Bowel: Stomach is within normal limits. Appendix appears normal. No evidence of bowel wall thickening, distention, or inflammatory changes. Vascular/Lymphatic: Calcific aortic atherosclerosis with mild aortic ectasia. Maximal AP diameter is 2.9 cm. Bilateral iliac artery aneurysms measuring 1.8 cm on the right and 1.7 cm on the left. No retroperitoneal hematoma. Scattered lymph nodes in the upper abdomen are not pathologically enlarged and are likely reactive. Reproductive: Uterus and bilateral adnexa are unremarkable. Other: No abdominal wall hernia or abnormality. No abdominopelvic ascites. Musculoskeletal: Degenerative changes in the spine. No destructive bone lesions. Note the examination is technically limited due to motion artifact. IMPRESSION: Small esophageal hiatal hernia. Tiny nonobstructing stones in the kidneys. Aortic atherosclerosis with ectatic aorta and bilateral iliac artery aneurysms. No acute process demonstrated. Electronically Signed   By: Lucienne Capers M.D.   On: 09/19/2016 02:55   Dg Chest 2 View  Result Date: 09/18/2016 CLINICAL DATA:  Patient with shortness of breath. History of congestive heart failure. EXAM: CHEST  2 VIEW COMPARISON:  Chest radiograph 09/17/2016. FINDINGS: Stable cardiomegaly status post median sternotomy and CABG procedure. Pulmonary vascular redistribution. Mild bilateral perihilar interstitial opacities. Lung bases are excluded from view on lateral view. No pneumothorax. IMPRESSION: Cardiomegaly with mild interstitial edema. Electronically Signed   By: Lovey Newcomer M.D.   On: 09/18/2016 09:26   Mr Thoracic Spine Wo Contrast  Result Date: 09/20/2016 CLINICAL DATA:  Mid and low back pain for 2-3 months. No known injury. EXAM: MRI THORACIC AND LUMBAR SPINE WITHOUT CONTRAST  TECHNIQUE: Multiplanar and multiecho pulse sequences of the thoracic and lumbar spine were obtained without intravenous contrast. COMPARISON:  New FINDINGS: MRI THORACIC SPINE FINDINGS The study is degraded  by patient motion and the patient's size. Alignment:  Maintained. Vertebrae: No fracture is identified. Marrow signal is diffusely decreased on both T1 and T2 weighted imaging. Cord: The thoracic cord demonstrates normal signal. Focus of T2 hyperintensity centrally in the cord at C4-5 is likely artifactual. Paraspinal and other soft tissues: Unremarkable. Disc levels: Disc bulge at C4-5 appears to efface the ventral thecal sac and may deflect the cord. Shallow left paracentral protrusion T4-5 without central canal or foraminal narrowing. Shallow left paracentral protrusion T5-6 without central canal or foraminal narrowing. Shallow right paracentral protrusion T6-7 without central canal or foraminal narrowing. Shallow right paracentral protrusion T7-8 without central canal or foraminal narrowing. Except as described above, intervertebral disc spaces are unremarkable. MRI LUMBAR SPINE FINDINGS Segmentation:  Unremarkable. Alignment:  Maintained. Vertebrae: No fracture. Decreased T1 and T2 signal seen. Degenerative endplate signal change K8-M0 noted. Conus medullaris: Extends to the L2 level and appears normal. Paraspinal and other soft tissues: T2 hyperintense lesion the right kidney is likely a cyst. Disc levels: L1-2: Negative. L2-3: Very shallow central protrusion without central canal or foraminal narrowing. L3-4:  Negative. L4-5:  Mild facet degenerative change.  Otherwise negative. L5-S1: Eccentric to the left disc bulge with endplate spur. There is some narrowing in the left lateral recess and foramen. The right foramen is open. IMPRESSION: MR THORACIC SPINE IMPRESSION Degraded study due to the patient's size and motion. Decreased marrow signal is likely in part artifactual related to the patient's size and  could also be due to obesity and/or cigarette smoking. Marrow infiltrative process is thought unlikely. Increased T2 signal in the cervical cord at C4-5 is most likely artifactual. If the patient has myelopathic symptoms, cervical spine MRI could be used for further evaluation. Mild thoracic spondylosis without central canal or foraminal stenosis. MR LUMBAR SPINE IMPRESSION Degraded study due to patient motion and size. Decreased marrow signal in all imaged bones is likely in part artifactual and could also be due to obesity or cigarette smoking. Marrow infiltrative process is thought unlikely. Spondylosis at L5-S1 where there is narrowing in the left lateral recess and foramen without obvious nerve root compression. Electronically Signed   By: Inge Rise M.D.   On: 09/20/2016 16:33   Mr Lumbar Spine Wo Contrast  Result Date: 09/20/2016 CLINICAL DATA:  Mid and low back pain for 2-3 months. No known injury. EXAM: MRI THORACIC AND LUMBAR SPINE WITHOUT CONTRAST TECHNIQUE: Multiplanar and multiecho pulse sequences of the thoracic and lumbar spine were obtained without intravenous contrast. COMPARISON:  New FINDINGS: MRI THORACIC SPINE FINDINGS The study is degraded by patient motion and the patient's size. Alignment:  Maintained. Vertebrae: No fracture is identified. Marrow signal is diffusely decreased on both T1 and T2 weighted imaging. Cord: The thoracic cord demonstrates normal signal. Focus of T2 hyperintensity centrally in the cord at C4-5 is likely artifactual. Paraspinal and other soft tissues: Unremarkable. Disc levels: Disc bulge at C4-5 appears to efface the ventral thecal sac and may deflect the cord. Shallow left paracentral protrusion T4-5 without central canal or foraminal narrowing. Shallow left paracentral protrusion T5-6 without central canal or foraminal narrowing. Shallow right paracentral protrusion T6-7 without central canal or foraminal narrowing. Shallow right paracentral protrusion  T7-8 without central canal or foraminal narrowing. Except as described above, intervertebral disc spaces are unremarkable. MRI LUMBAR SPINE FINDINGS Segmentation:  Unremarkable. Alignment:  Maintained. Vertebrae: No fracture. Decreased T1 and T2 signal seen. Degenerative endplate signal change L4-J1 noted. Conus medullaris: Extends to the L2 level  and appears normal. Paraspinal and other soft tissues: T2 hyperintense lesion the right kidney is likely a cyst. Disc levels: L1-2: Negative. L2-3: Very shallow central protrusion without central canal or foraminal narrowing. L3-4:  Negative. L4-5:  Mild facet degenerative change.  Otherwise negative. L5-S1: Eccentric to the left disc bulge with endplate spur. There is some narrowing in the left lateral recess and foramen. The right foramen is open. IMPRESSION: MR THORACIC SPINE IMPRESSION Degraded study due to the patient's size and motion. Decreased marrow signal is likely in part artifactual related to the patient's size and could also be due to obesity and/or cigarette smoking. Marrow infiltrative process is thought unlikely. Increased T2 signal in the cervical cord at C4-5 is most likely artifactual. If the patient has myelopathic symptoms, cervical spine MRI could be used for further evaluation. Mild thoracic spondylosis without central canal or foraminal stenosis. MR LUMBAR SPINE IMPRESSION Degraded study due to patient motion and size. Decreased marrow signal in all imaged bones is likely in part artifactual and could also be due to obesity or cigarette smoking. Marrow infiltrative process is thought unlikely. Spondylosis at L5-S1 where there is narrowing in the left lateral recess and foramen without obvious nerve root compression. Electronically Signed   By: Inge Rise M.D.   On: 09/20/2016 16:33   Dg Chest Port 1 View  Result Date: 09/17/2016 CLINICAL DATA:  Fever and shortness of breath. EXAM: PORTABLE CHEST 1 VIEW COMPARISON:  09/16/2016 FINDINGS:  Postoperative changes in the mediastinum. Cardiac enlargement with mild pulmonary vascular congestion. Hazy interstitial opacities in the mid lungs likely representing early interstitial edema. No blunting of costophrenic angles. No pneumothorax. No focal consolidation. IMPRESSION: Cardiac enlargement with pulmonary vascular congestion and mild interstitial edema. Electronically Signed   By: Lucienne Capers M.D.   On: 09/17/2016 23:43   Dg Chest Port 1 View  Result Date: 09/16/2016 CLINICAL DATA:  Shortness of Breath EXAM: PORTABLE CHEST 1 VIEW COMPARISON:  April 17, 2016 FINDINGS: There is no edema or consolidation. Heart is borderline enlarged with pulmonary venous hypertension. Patient is status post coronary artery bypass grafting. No adenopathy. No bone lesions. IMPRESSION: Pulmonary vascular congestion without frank edema or consolidation. Stable cardiac silhouette. Electronically Signed   By: Lowella Grip III M.D.   On: 09/16/2016 08:07    Micro Results    Recent Results (from the past 240 hour(s))  Urine culture     Status: Abnormal   Collection Time: 09/17/16  6:45 AM  Result Value Ref Range Status   Specimen Description URINE, RANDOM  Final   Special Requests NONE  Final   Culture MULTIPLE SPECIES PRESENT, SUGGEST RECOLLECTION (A)  Final   Report Status 09/19/2016 FINAL  Final  Culture, blood (routine x 2)     Status: None (Preliminary result)   Collection Time: 09/17/16 11:24 PM  Result Value Ref Range Status   Specimen Description RIGHT ANTECUBITAL  Final   Special Requests   Final    BOTTLES DRAWN AEROBIC AND ANAEROBIC RED 6CC BLUE 10CC   Culture NO GROWTH 4 DAYS  Final   Report Status PENDING  Incomplete  Culture, blood (routine x 2)     Status: None (Preliminary result)   Collection Time: 09/17/16 11:32 PM  Result Value Ref Range Status   Specimen Description BLOOD RIGHT HAND  Final   Special Requests BOTTLES DRAWN AEROBIC AND ANAEROBIC  10CC EACH  Final   Culture  NO GROWTH 4 DAYS  Final   Report Status  PENDING  Incomplete    Today   Subjective    [name] [name] today has no headache,no chest abdominal pain,no new weakness tingling or numbness, feels much better wants to go home today.    Objective   Blood pressure 133/72, pulse 69, temperature 98.4 F (36.9 C), resp. rate (!) 24, height _0  (1.727 m), weight 135.2 kg (298 lb), SpO2 94 %.   Intake/Output Summary (Last 24 hours) at 09/23/16 0924 Last data filed at 09/23/16 0500  Gross per 24 hour  Intake             1082 ml  Output              850 ml  Net              232 ml    Exam Awake Alert, Oriented x 3, No new F.N deficits, Normal affect [place].AT,PERRAL Supple Neck,No JVD, No cervical lymphadenopathy appriciated.  Symmetrical Chest wall movement, Good air movement bilaterally, CTAB RRR,No Gallops,Rubs or new Murmurs, No Parasternal Heave +ve B.Sounds, Abd Soft, Non tender, No organomegaly appriciated, No rebound -guarding or rigidity. No Cyanosis, Clubbing or edema, No new Rash or bruise   Data Review   CBC w Diff: Lab Results  Component Value Date   WBC 8.6 09/23/2016   HGB 7.4 (L) 09/23/2016   HCT 27.1 (L) 09/23/2016   PLT 492 (H) 09/23/2016   LYMPHOPCT 10 09/19/2016   MONOPCT 10 09/19/2016   EOSPCT 2 09/19/2016   BASOPCT 0 09/19/2016    CMP: Lab Results  Component Value Date   NA 137 09/20/2016   K 4.4 09/20/2016   CL 104 09/20/2016   CO2 23 09/20/2016   BUN 44 (H) 09/20/2016   CREATININE 2.59 (H) 09/20/2016   CREATININE 2.62 (H) 04/29/2016   PROT 6.5 09/19/2016   ALBUMIN 2.7 (L) 09/19/2016   BILITOT 0.6 09/19/2016   ALKPHOS 93 09/19/2016   AST 15 09/19/2016   ALT 10 (L) 09/19/2016  .   Total Time in preparing paper work, data evaluation and todays exam - 35 minutes  Thurnell Lose M.D on 09/23/2016 at 9:24 AM  [facility]   Office  [phone]

## 2016-09-23 NOTE — Procedures (Signed)
Interventional Radiology Procedure Note  Procedure: CT guided aspirate and core biopsy of posterior right iliac bone Complications: None Recommendations: - Bedrest supine x 1 hrs - OTC's PRN  Pain - Follow biopsy results  Signed,  Maya Scholer S. Narek Kniss, DO   

## 2016-09-23 NOTE — Sedation Documentation (Signed)
Vital signs stable. 

## 2016-09-23 NOTE — Care Management Note (Signed)
Case Management Note  Patient Details  Name: [name] [name] MRN: [id number] Date of Birth: 06-12-1951  Subjective/Objective: Pt presented for Symptomatic anemia/ Acute on Chronic CHF. Pt received a total of 4 units PRBC's. Pt with recurrent fevers. Plan for bone marrow biopsy 09-23-16. Plan will be to return home once stable for d/c.                    Action/Plan: No needs identified by CM at this time.   Expected Discharge Date:                  Expected Discharge Plan:  Home/Self Care  In-House Referral:  NA  Discharge planning Services  CM Consult  Post Acute Care Choice:  NA Choice offered to:  NA  DME Arranged:  N/A DME Agency:  NA  HH Arranged:  NA HH Agency:  NA  Status of Service:  Completed, signed off  If discussed at [place] of Stay Meetings, dates discussed:    Additional Comments:  Bethena Roys, RN 09/23/2016, 10:49 AM

## 2016-09-26 LAB — TYPE AND SCREEN
ABO/RH(D): O POS
Antibody Screen: NEGATIVE
UNIT DIVISION: 0
Unit division: 0
Unit division: 0

## 2016-09-26 LAB — ACID FAST SMEAR (AFB, MYCOBACTERIA): Acid Fast Smear: NEGATIVE

## 2016-09-28 LAB — AEROBIC/ANAEROBIC CULTURE (SURGICAL/DEEP WOUND)

## 2016-09-28 LAB — AEROBIC/ANAEROBIC CULTURE W GRAM STAIN (SURGICAL/DEEP WOUND): Culture: NO GROWTH

## 2016-10-03 LAB — CHROMOSOME ANALYSIS, BONE MARROW

## 2016-10-16 ENCOUNTER — Encounter (HOSPITAL_COMMUNITY): Payer: Self-pay

## 2016-10-24 LAB — FUNGUS CULTURE RESULT

## 2016-10-24 LAB — FUNGAL ORGANISM REFLEX

## 2016-10-24 LAB — FUNGUS CULTURE WITH STAIN

## 2016-11-07 LAB — ACID FAST CULTURE WITH REFLEXED SENSITIVITIES: ACID FAST CULTURE - AFSCU3: NEGATIVE

## 2017-03-25 IMAGING — DX DG CHEST 2V
3 series · 3 of 3 positions shown · non-contrast
Comparison: 01/13/2016

CLINICAL DATA: LEFT chest pain after eating today, more pain
radiating to central chest, diaphoresis, diabetes mellitus, CHF,
coronary artery disease, prior stroke, chronic kidney disease,
hypertension

EXAM:
CHEST  2 VIEW

[chest pa]
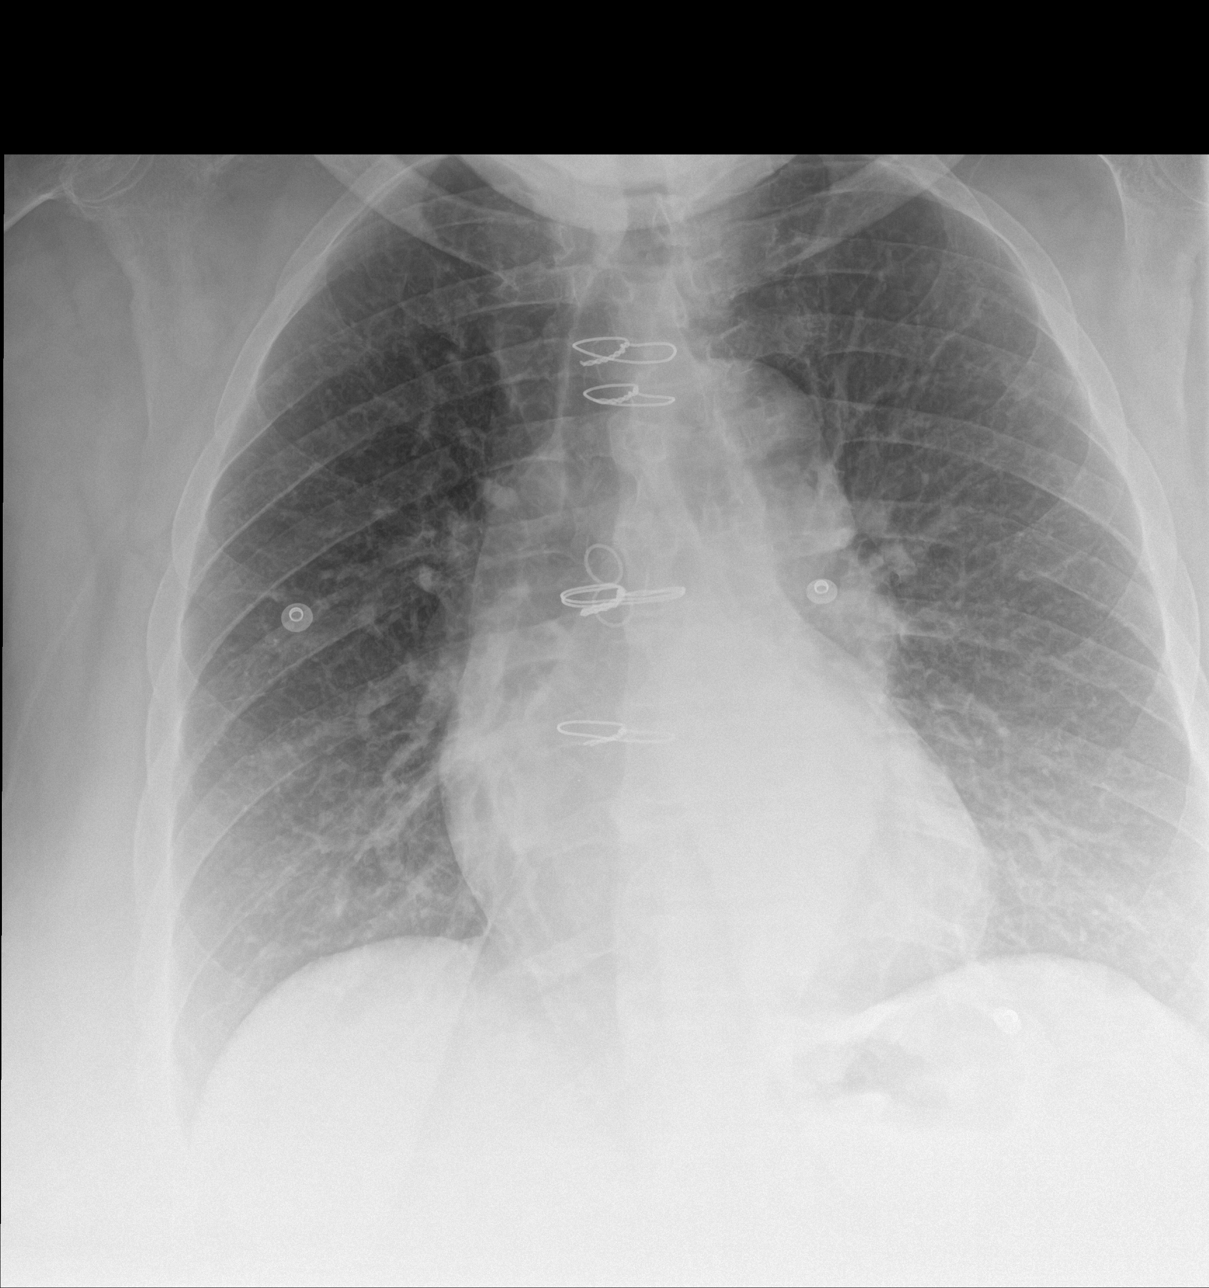

[chest lat]
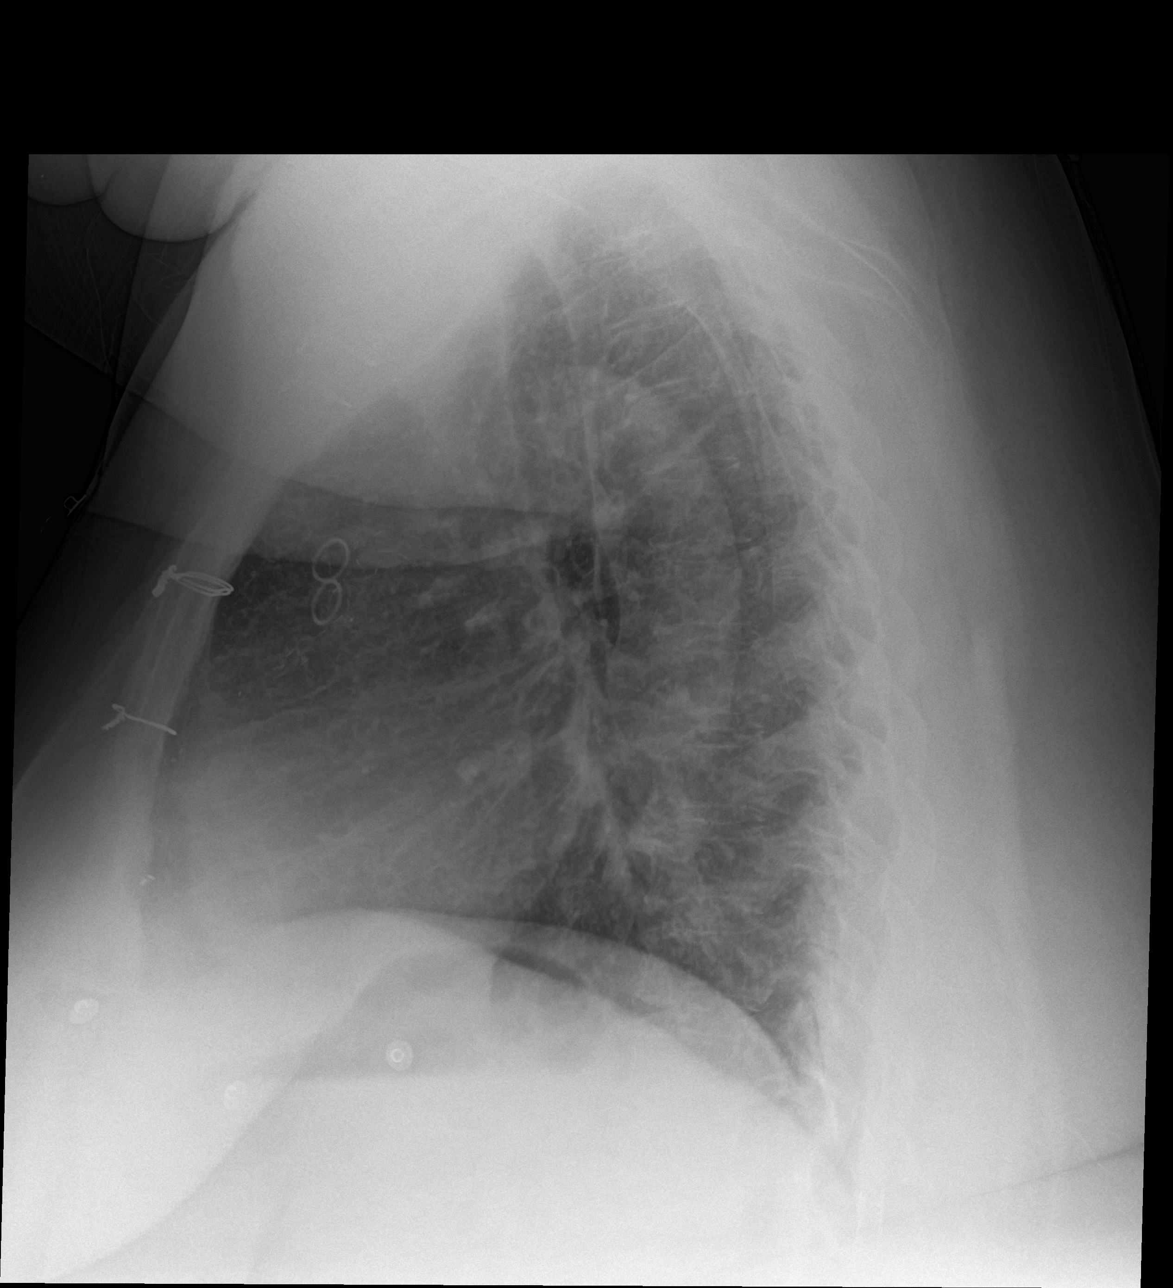

[chest ap]
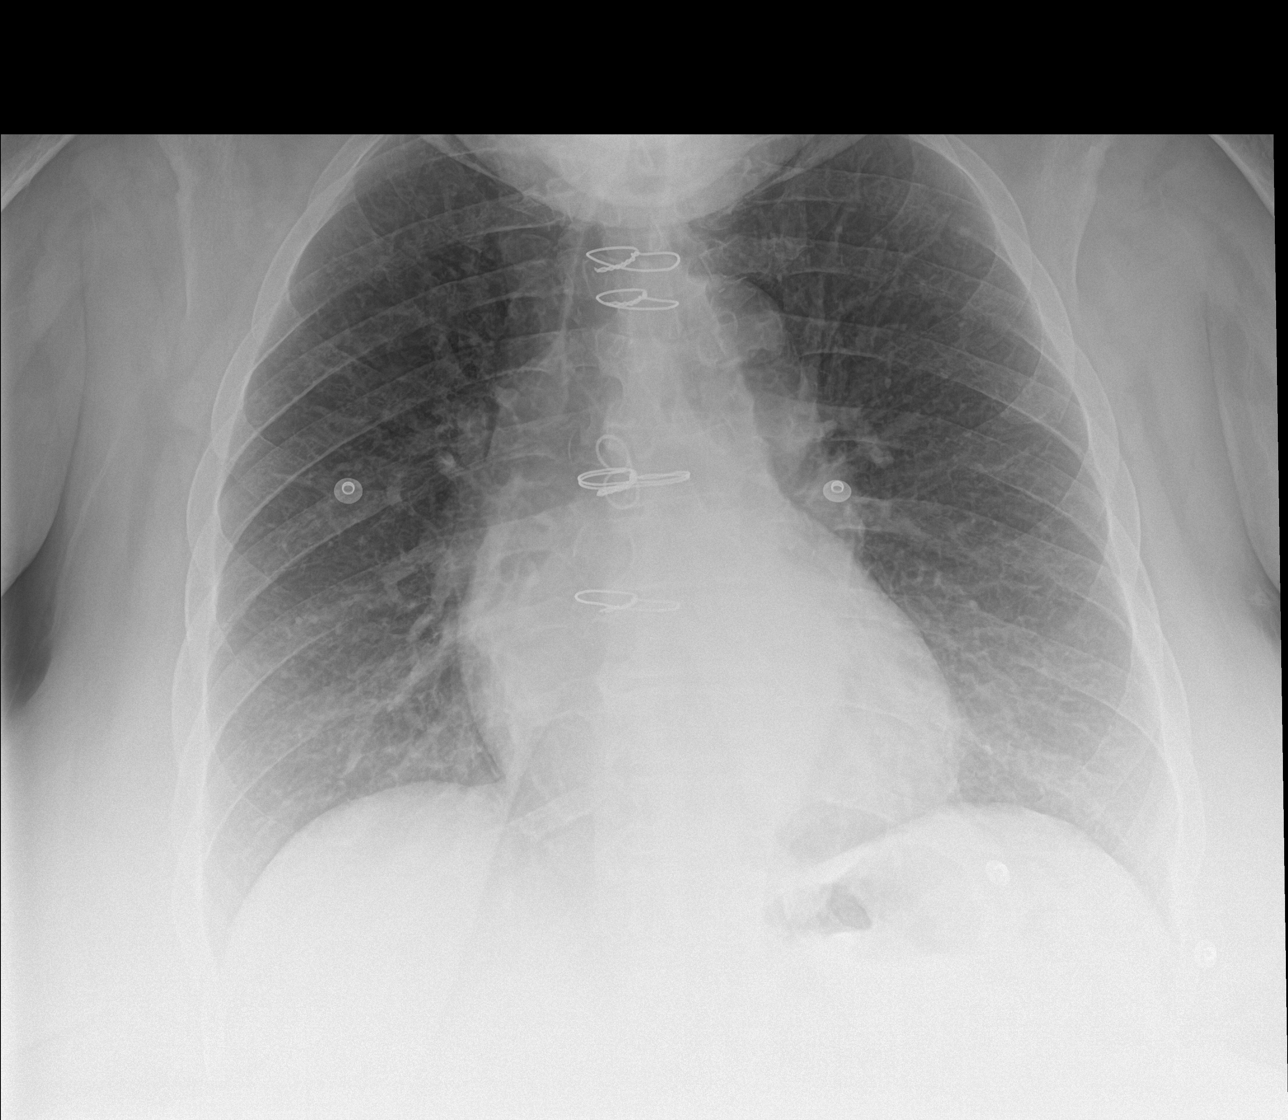

[3 of 3 positions shown; findings below may reference images not displayed]

FINDINGS: Minimal enlargement of cardiac silhouette post CABG.

Tortuous aorta.

Mediastinal contours and pulmonary vascularity otherwise normal.

Lungs clear.

No infiltrate, pleural effusion, or pneumothorax.

Bones demineralized.
IMPRESSION: Minimal enlargement of cardiac silhouette post CABG.

No acute abnormalities.

## 2017-05-20 DIAGNOSIS — E119 Type 2 diabetes mellitus without complications: Secondary | ICD-10-CM | POA: Diagnosis not present

## 2017-05-20 DIAGNOSIS — N183 Chronic kidney disease, stage 3 (moderate): Secondary | ICD-10-CM | POA: Diagnosis not present

## 2017-05-20 DIAGNOSIS — R0609 Other forms of dyspnea: Secondary | ICD-10-CM | POA: Diagnosis not present

## 2017-05-20 DIAGNOSIS — I10 Essential (primary) hypertension: Secondary | ICD-10-CM | POA: Diagnosis not present

## 2017-12-23 ENCOUNTER — Encounter (HOSPITAL_COMMUNITY): Payer: Self-pay | Admitting: Emergency Medicine

## 2017-12-23 ENCOUNTER — Emergency Department (HOSPITAL_COMMUNITY)
Admission: EM | Admit: 2017-12-23 | Discharge: 2017-12-24 | Disposition: A | Discharge status: Home / Self Care | Payer: Medicare Other | Attending: Emergency Medicine | Admitting: Emergency Medicine

## 2017-12-23 ENCOUNTER — Emergency Department (HOSPITAL_COMMUNITY): Payer: Medicare Other

## 2017-12-23 ENCOUNTER — Other Ambulatory Visit: Payer: Self-pay

## 2017-12-23 DIAGNOSIS — Z7982 Long term (current) use of aspirin: Secondary | ICD-10-CM | POA: Insufficient documentation

## 2017-12-23 DIAGNOSIS — Z79899 Other long term (current) drug therapy: Secondary | ICD-10-CM | POA: Insufficient documentation

## 2017-12-23 DIAGNOSIS — I2510 Atherosclerotic heart disease of native coronary artery without angina pectoris: Secondary | ICD-10-CM | POA: Insufficient documentation

## 2017-12-23 DIAGNOSIS — I130 Hypertensive heart and chronic kidney disease with heart failure and stage 1 through stage 4 chronic kidney disease, or unspecified chronic kidney disease: Secondary | ICD-10-CM | POA: Diagnosis not present

## 2017-12-23 DIAGNOSIS — M79601 Pain in right arm: Secondary | ICD-10-CM | POA: Diagnosis present

## 2017-12-23 DIAGNOSIS — E1122 Type 2 diabetes mellitus with diabetic chronic kidney disease: Secondary | ICD-10-CM | POA: Diagnosis not present

## 2017-12-23 DIAGNOSIS — F17200 Nicotine dependence, unspecified, uncomplicated: Secondary | ICD-10-CM | POA: Insufficient documentation

## 2017-12-23 DIAGNOSIS — Z951 Presence of aortocoronary bypass graft: Secondary | ICD-10-CM | POA: Diagnosis not present

## 2017-12-23 DIAGNOSIS — F1410 Cocaine abuse, uncomplicated: Secondary | ICD-10-CM | POA: Insufficient documentation

## 2017-12-23 DIAGNOSIS — N184 Chronic kidney disease, stage 4 (severe): Secondary | ICD-10-CM | POA: Insufficient documentation

## 2017-12-23 DIAGNOSIS — M10031 Idiopathic gout, right wrist: Secondary | ICD-10-CM | POA: Insufficient documentation

## 2017-12-23 DIAGNOSIS — I5042 Chronic combined systolic (congestive) and diastolic (congestive) heart failure: Secondary | ICD-10-CM | POA: Diagnosis not present

## 2017-12-23 DIAGNOSIS — I252 Old myocardial infarction: Secondary | ICD-10-CM | POA: Diagnosis not present

## 2017-12-23 DIAGNOSIS — M109 Gout, unspecified: Secondary | ICD-10-CM

## 2017-12-23 DIAGNOSIS — Z794 Long term (current) use of insulin: Secondary | ICD-10-CM | POA: Insufficient documentation

## 2017-12-23 DIAGNOSIS — R202 Paresthesia of skin: Secondary | ICD-10-CM | POA: Insufficient documentation

## 2017-12-23 DIAGNOSIS — N19 Unspecified kidney failure: Secondary | ICD-10-CM

## 2017-12-23 LAB — CBC WITH DIFFERENTIAL/PLATELET
Basophils Absolute: 0 10*3/uL (ref 0.0–0.1)
Basophils Relative: 0 %
EOS PCT: 0 %
Eosinophils Absolute: 0 10*3/uL (ref 0.0–0.7)
HCT: 30.1 % — ABNORMAL LOW (ref 36.0–46.0)
Hemoglobin: 9.3 g/dL — ABNORMAL LOW (ref 12.0–15.0)
LYMPHS ABS: 1.3 10*3/uL (ref 0.7–4.0)
LYMPHS PCT: 9 %
MCH: 25.2 pg — AB (ref 26.0–34.0)
MCHC: 30.9 g/dL (ref 30.0–36.0)
MCV: 81.6 fL (ref 78.0–100.0)
MONO ABS: 1.4 10*3/uL — AB (ref 0.1–1.0)
Monocytes Relative: 9 %
Neutro Abs: 12 10*3/uL — ABNORMAL HIGH (ref 1.7–7.7)
Neutrophils Relative %: 82 %
PLATELETS: 374 10*3/uL (ref 150–400)
RBC: 3.69 MIL/uL — AB (ref 3.87–5.11)
RDW: 17.2 % — AB (ref 11.5–15.5)
WBC: 14.7 10*3/uL — ABNORMAL HIGH (ref 4.0–10.5)

## 2017-12-23 LAB — BASIC METABOLIC PANEL
Anion gap: 13 (ref 5–15)
BUN: 120 mg/dL — AB (ref 6–20)
CHLORIDE: 94 mmol/L — AB (ref 101–111)
CO2: 26 mmol/L (ref 22–32)
Calcium: 9.1 mg/dL (ref 8.9–10.3)
Creatinine, Ser: 3.01 mg/dL — ABNORMAL HIGH (ref 0.44–1.00)
GFR calc Af Amer: 18 mL/min — ABNORMAL LOW (ref >60)
GFR calc non Af Amer: 15 mL/min — ABNORMAL LOW (ref >60)
GLUCOSE: 283 mg/dL — AB (ref 65–99)
POTASSIUM: 4 mmol/L (ref 3.5–5.1)
Sodium: 133 mmol/L — ABNORMAL LOW (ref 135–145)

## 2017-12-23 LAB — C-REACTIVE PROTEIN: CRP: 14.7 mg/dL — ABNORMAL HIGH (ref <1.0)

## 2017-12-23 LAB — URIC ACID: Uric Acid, Serum: 10.5 mg/dL — ABNORMAL HIGH (ref 2.3–6.6)

## 2017-12-23 LAB — SEDIMENTATION RATE: Sed Rate: 82 mm/hr — ABNORMAL HIGH (ref 0–22)

## 2017-12-23 LAB — SYNOVIAL CELL COUNT + DIFF, W/ CRYSTALS: WBC, Synovial: UNDETERMINED /mm3 (ref 0–200)

## 2017-12-23 MED ORDER — BENZOCAINE (TOPICAL) 20 % EX AERO: INHALATION_SPRAY | Freq: Four times a day (QID) | CUTANEOUS | Status: DC | PRN

## 2017-12-23 MED ORDER — PREDNISONE 10 MG PO TABS: 60.0000 mg | ORAL_TABLET | Freq: Every day | ORAL | 0 refills | Status: DC

## 2017-12-23 MED ORDER — SODIUM CHLORIDE 0.9 % IV BOLUS (SEPSIS)
1000.0000 mL | Freq: Once | INTRAVENOUS | Status: AC
  Administered 2017-12-23: 1000 mL via INTRAVENOUS

## 2017-12-23 MED ORDER — LIDOCAINE-EPINEPHRINE (PF) 2 %-1:200000 IJ SOLN
20.0000 mL | Freq: Once | INTRAMUSCULAR | Status: AC
  Administered 2017-12-23: 20 mL via INTRADERMAL
  Filled 2017-12-23: qty 20

## 2017-12-23 MED ORDER — SODIUM CHLORIDE 0.9 % IV BOLUS (SEPSIS): 2000.0000 mL | Freq: Once | INTRAVENOUS | Status: DC

## 2017-12-23 MED ORDER — HYDROCODONE-ACETAMINOPHEN 5-325 MG PO TABS: 1.0000 | ORAL_TABLET | Freq: Three times a day (TID) | ORAL | 0 refills | Status: DC | PRN

## 2017-12-23 NOTE — Discharge Instructions (Signed)
The workup here shows that you have a gout. Please take the medicine prescribed.  We are giving you a very short course of steroids for the gout, your blood sugar will be rising.  Please refrain from any alcohol use or red meat use for now.  Please see the neurologist for the right-sided numbness - you need further workup for stroke. CT scan of the head is normal.  Return to the ER if your symptoms get worse.

## 2017-12-23 NOTE — ED Provider Notes (Signed)
[facility] DEPT Provider Note   CSN: [id number] Arrival date & time: 12/23/17  1416     History   Chief Complaint Chief Complaint  Patient presents with  . Arm Pain  . Leg Pain    HPI [name] [name] is a 66 y.o. female.  HPI  66 y/o femalewith medical history significant of CKD, CHF, previous polysubstance abuse, CAD status post CABG, CVA with residual L sided weakness comes in with cc of right arm pain, right leg pain, right-sided weakness with tingling. Patient reports that her symptoms started about 3 days ago.  Patient has tingling sensation in her right hand, and the right lower extremity in its entirety.  Patient denies any focal weakness to the right side, or any numbness or tingling to the face. Pt also c/o R hand pain. Pt was informed that she has gout by PCP.  Review of system is positive for generalized weakness.  Past Medical History:  Diagnosis Date  . Anemia 08/2016  . Arthritis of left shoulder region 03/23/2013  . Chronic combined systolic and diastolic CHF (congestive heart failure) (HCC)    a. EF 40-45%, mild LVH, mid apicalanteroseptal and apical HK.  . CKD (chronic kidney disease), stage III ([place])   . Cocaine abuse ([place])    crack cocaine heavily until 2008 then sporadic use since then  . Coronary artery disease    a. 06/2012 NSTEMI/CABG x 3 (LIMA->LAD, VG->OM2, VG->LCX);  b. 04/2015 MV: EF<30%, mid ant, apicalanterior, apical infarct;  c. 04/2015 Cath: LM nl, LAD 90p, LCX 32m, OM1 min irregs, RCA mild dzs, LIMA->LAD nl w/ dist LAD dzs, VG->OM2 nl, VG->LCX nl-->Med Rx.  . CVA (cerebral infarction)    a. right internal capsule stroke in 12/2006  . Diabetes mellitus    diagnosed in 2008  . Essential hypertension   . Glaucoma   . Hyperlipidemia   . Left-sided sensory deficit present   . PVD (peripheral vascular disease) ([place])    a. 06/2012 ABI's: R - 0.73, L - 0.71.  Marland Kitchen Shortness of breath dyspnea   . Thyroid nodule    FNA in  9024 showed follicular cells but not definate neoplasm  . Tobacco abuse     Patient Active Problem List   Diagnosis Date Noted  . Fever   . Symptomatic anemia 09/16/2016  . Diabetes mellitus with complication ([place]) 09/73/5329  . Obesity 04/29/2016  . Hiatal hernia   . Heme + stool   . Absolute anemia   . Bleeding gastrointestinal   . Insulin dependent diabetes mellitus ([place])   . Pain in the chest   . Demand ischemia ([place])   . Iron deficiency anemia   . Precordial pain   . Chest pain 04/17/2016  . Thrombocytosis ([place]) 04/17/2016  . Microcytic anemia 02/05/2016  . Abscess   . Acute systolic heart failure ([place])   . Acute on chronic renal failure ([place])   . CAD in native artery   . Substance abuse ([place])   . Chronic combined systolic and diastolic CHF (congestive heart failure) ([place])   . CKD (chronic kidney disease) stage 4, GFR 15-29 ml/min (HCC)   . Demand ischemia of myocardium - related to Acue HF exacerbation 05/14/2015  . Morbid obesity ([place])   . Acute on chronic combined systolic and diastolic congestive heart failure ([place]) 05/12/2015  . Acute respiratory failure ([place]) 10/18/2014  . Type 2 diabetes mellitus with renal manifestations, controlled ([place]) 10/18/2014  . S/P CABG (coronary artery  bypass graft) 09/21/2013  . Arthritis of right shoulder region 03/23/2013  . Tobacco abuse   . Coronary artery disease   . NSTEMI (non-ST elevated myocardial infarction) ([place]) 06/29/2012  . History of cocaine abuse 06/29/2012  . Acute kidney injury ([place]) 06/29/2012  . Essential hypertension 06/29/2012  . RBC microcytosis 06/29/2012  . Glaucoma   . Hyperlipidemia   . History of CVA (cerebrovascular accident)     Past Surgical History:  Procedure Laterality Date  . CARDIAC CATHETERIZATION    . CARDIAC CATHETERIZATION N/A 05/17/2015   Procedure: Left Heart Cath and Cors/Grafts Angiography;  Surgeon: [name], MD;  Location: [facility] CV LAB;  Service: Cardiovascular;   Laterality: N/A;  . COLONOSCOPY WITH PROPOFOL N/A 04/21/2016   Procedure: COLONOSCOPY WITH PROPOFOL;  Surgeon: Irene Shipper, MD;  Location: [facility];  Service: Endoscopy;  Laterality: N/A;  . CORONARY ARTERY BYPASS GRAFT  07/09/2012   Procedure: CORONARY ARTERY BYPASS GRAFTING (CABG);  Surgeon: Ivin Poot, MD;  Location: [facility];  Service: Open Heart Surgery;  Laterality: N/A;  . ESOPHAGOGASTRODUODENOSCOPY N/A 04/20/2016   Procedure: ESOPHAGOGASTRODUODENOSCOPY (EGD);  Surgeon: Gatha Mayer, MD;  Location[facility] ENDOSCOPY;  Service: Endoscopy;  Laterality: N/A;  . LEFT HEART CATHETERIZATION WITH CORONARY ANGIOGRAM N/A 06/29/2012   Procedure: LEFT HEART CATHETERIZATION WITH CORONARY ANGIOGRAM;  Surgeon: Peter M Martinique, MD;  Location: [facility] CATH LAB;  Service: Cardiovascular;  Laterality: N/A;  . STERNAL WOUND DEBRIDEMENT  08/17/2012   Procedure: STERNAL WOUND DEBRIDEMENT;  Surgeon: Ivin Poot, MD;  Location: [facility] OR;  Service: Thoracic;  Laterality: N/A;  wound vac application  . STERNAL WOUND DEBRIDEMENT  08/24/2012   Procedure: STERNAL WOUND DEBRIDEMENT;  Surgeon: Ivin Poot, MD;  Location: [facility];  Service: Thoracic;  Laterality: N/A;  . STERNAL WOUND DEBRIDEMENT  09/01/2012   Procedure: STERNAL WOUND DEBRIDEMENT;  Surgeon: Ivin Poot, MD;  Location: [facility];  Service: Thoracic;  Laterality: N/A;  . STERNAL WOUND DEBRIDEMENT  09/20/2012   Procedure: STERNAL WOUND DEBRIDEMENT;  Surgeon: Ivin Poot, MD;  Location: [facility] OR;  Service: Thoracic;  Laterality: N/A;  wound vac change    OB History    No data available       Home Medications    Prior to Admission medications   Medication Sig Start Date End Date Taking? Authorizing Provider  allopurinol (ZYLOPRIM) 100 MG tablet Take 100 mg by mouth daily. 11/25/17  Yes [provider]  aspirin 325 MG tablet Take 325 mg by mouth daily.   Yes [provider]  brinzolamide (AZOPT) 1 % ophthalmic suspension Place 1 drop into  both eyes 3 (three) times daily.   Yes [provider]  buPROPion (WELLBUTRIN SR) 150 MG 12 hr tablet Take 1 tablet (150 mg total) by mouth 2 (two) times daily. 01/16/16  Yes Mikhail, Velta [place], DO  cetirizine (ZYRTEC) 10 MG tablet Take 10 mg by mouth daily as needed for allergies.    Yes [provider]  clotrimazole-betamethasone (LOTRISONE) cream Apply 1 application topically 2 (two) times daily. On left hand between fingers 09/15/16  Yes [provider]  ferrous sulfate 325 (65 FE) MG tablet Take 1 tablet (325 mg total) by mouth 2 (two) times daily with a meal. 04/21/16  Yes Bonnielee Haff, MD  Fluticasone-Salmeterol (ADVAIR) 250-50 MCG/DOSE AEPB Inhale 1 puff into the lungs every 12 (twelve) hours.   Yes [provider]  furosemide (LASIX) 40 MG tablet Take 1 tablet (40 mg  total) by mouth daily. Take additional dose if you experience weight gain. 05/17/15  Yes Mikhail, Maryann, DO  gabapentin (NEURONTIN) 300 MG capsule Take 300 mg by mouth at bedtime.  01/31/16  Yes [provider]  glimepiride (AMARYL) 4 MG tablet Take 4 mg by mouth daily. 04/07/16  Yes [provider]  Insulin Glargine (LANTUS SOLOSTAR) 100 UNIT/ML Solostar Pen Inject 25 Units into the skin daily at 10 pm. 01/16/16  Yes Mikhail, Velta [place], DO  latanoprost (XALATAN) 0.005 % ophthalmic solution Place 1 drop into both eyes at bedtime.   Yes [provider]  LINZESS 72 MCG capsule TAKE  1  CAPSULE BY MOUTH ONCE DAILY ON AN EMPTY STOMACH AT LEAST 30 MINUTES BEFORE FIRST MEAL OF THE DAY 11/21/17  Yes [provider]  metolazone (ZAROXOLYN) 5 MG tablet Take 5 mg by mouth daily. 11/06/17  Yes [provider]  metoprolol tartrate (LOPRESSOR) 25 MG tablet Take 0.5 tablets (12.5 mg total) by mouth 2 (two) times daily. 01/16/16  Yes Mikhail, Maryann, DO  pantoprazole (PROTONIX) 40 MG tablet Take 1 tablet (40 mg total) by mouth daily. 09/23/16  Yes Thurnell Lose, MD    pravastatin (PRAVACHOL) 40 MG tablet Take 40 mg by mouth at bedtime.   Yes [provider]  PROAIR HFA 108 (90 Base) MCG/ACT inhaler INHALE 1 TO 2 PUFFS BY MOUTH EVERY 6 HOURS AS NEEDED 11/17/17  Yes [provider]  traMADol (ULTRAM) 50 MG tablet Take 50 mg by mouth 3 (three) times daily.    Yes [provider]  HYDROcodone-acetaminophen (NORCO/VICODIN) 5-325 MG tablet Take 1 tablet by mouth every 8 (eight) hours as needed. 12/23/17   Varney Biles, MD  predniSONE (DELTASONE) 10 MG tablet Take 6 tablets (60 mg total) by mouth daily. 12/23/17   Varney Biles, MD    Family History Family History  Problem Relation Age of Onset  . Diabetes Mother   . Hypertension Mother   . Hyperlipidemia Father   . Other Unknown        no known family CAD    Social History Social History   Tobacco Use  . Smoking status: Current Some Day Smoker    Packs/day: 0.25    Years: 50.00    Pack years: 12.50  . Smokeless tobacco: Never Used  Substance Use Topics  . Alcohol use: No    Alcohol/week: 0.0 oz  . Drug use: Yes    Types: Cocaine     Allergies   Naproxen   Review of Systems Review of Systems  Constitutional: Positive for activity change.  Respiratory: Negative for shortness of breath.   Cardiovascular: Negative for chest pain.  Gastrointestinal: Negative for abdominal pain.  Neurological: Positive for weakness.  All other systems reviewed and are negative.    Physical Exam Updated Vital Signs BP (!) 113/54 (BP Location: Right Arm)   Pulse 82   Temp 99.1 F (37.3 C) (Oral)   Resp 16   Ht 5\' 8"  (1.727 m)   Wt 126.6 kg (279 lb)   SpO2 99%   BMI 42.42 kg/m   Physical Exam  Constitutional: She is oriented to person, place, and time. She appears well-developed.  HENT:  Head: Normocephalic and atraumatic.  Eyes: EOM are normal.  Neck: Normal range of motion. Neck supple.  Cardiovascular: Normal rate.  Pulmonary/Chest: Effort normal.   Abdominal: Bowel sounds are normal.  Neurological: She is alert and oriented to person, place, and time.  Skin: Skin is  warm and dry.  Nursing note and vitals reviewed.    ED Treatments / Results  Labs (all labs ordered are listed, but only abnormal results are displayed) Labs Reviewed  BASIC METABOLIC PANEL - Abnormal; Notable for the following components:      Result Value   Sodium 133 (*)    Chloride 94 (*)    Glucose, Bld 283 (*)    BUN 120 (*)    Creatinine, Ser 3.01 (*)    GFR calc non Af Amer 15 (*)    GFR calc Af Amer 18 (*)    All other components within normal limits  CBC WITH DIFFERENTIAL/PLATELET - Abnormal; Notable for the following components:   WBC 14.7 (*)    RBC 3.69 (*)    Hemoglobin 9.3 (*)    HCT 30.1 (*)    MCH 25.2 (*)    RDW 17.2 (*)    Neutro Abs 12.0 (*)    Monocytes Absolute 1.4 (*)    All other components within normal limits  URIC ACID - Abnormal; Notable for the following components:   Uric Acid, Serum 10.5 (*)    All other components within normal limits  C-REACTIVE PROTEIN - Abnormal; Notable for the following components:   CRP 14.7 (*)    All other components within normal limits  SEDIMENTATION RATE - Abnormal; Notable for the following components:   Sed Rate 82 (*)    All other components within normal limits  SYNOVIAL CELL COUNT + DIFF, W/ CRYSTALS - Abnormal; Notable for the following components:   Color, Synovial AMBER (*)    Appearance-Synovial TURBID (*)    All other components within normal limits  BODY FLUID CULTURE    EKG  EKG Interpretation None       Radiology Dg Wrist Complete Right  Result Date: 12/23/2017 CLINICAL DATA:  Pt c/o wrist pain severely w swelling x 3 days, no injury. EXAM: RIGHT WRIST - COMPLETE 3+ VIEW COMPARISON:  None. FINDINGS: Osseous alignment is normal. Bone mineralization is normal. No fracture line or displaced fracture fragment seen. No degenerative change or erosions seen. Adjacent soft  tissues are unremarkable. IMPRESSION: Negative. Electronically Signed   By: Franki Cabot M.D.   On: 12/23/2017 17:32   Ct Head Wo Contrast  Result Date: 12/23/2017 CLINICAL DATA:  Right arm and leg pain for 3 days, paresthesias EXAM: CT HEAD WITHOUT CONTRAST TECHNIQUE: Contiguous axial images were obtained from the base of the skull through the vertex without intravenous contrast. COMPARISON:  01/05/2007 FINDINGS: Brain: Progressive brain atrophy and chronic white matter microvascular ischemic changes throughout both cerebral hemispheres. Right occipital encephalomalacia from a previous right PCA territory infarct. No acute intracranial hemorrhage, definite new infarction, mass lesion, midline shift, herniation, hydrocephalus, or extra-axial fluid collection. Cisterns are patent. Cerebellar atrophy as well. Vascular: Intracranial atherosclerosis noted.  No hyperdense vessel. Skull: Normal. Negative for fracture or focal lesion. Sinuses/Orbits: No acute finding. Other: None. IMPRESSION: Progressive brain atrophy and chronic white matter microvascular ischemic changes since 2008. Remote right occipital PCA territory infarct with encephalomalacia No acute intracranial abnormality by noncontrast CT. Electronically Signed   By: Jerilynn Mages.  Shick M.D.   On: 12/23/2017 18:20    Procedures .Joint Aspiration/Arthrocentesis Date/Time: 12/23/2017 8:36 PM Performed by: Varney Biles, MD Authorized by: Varney Biles, MD   Consent:    Consent obtained:  Written   Consent given by:  Patient   Risks discussed:  Bleeding, infection, pain, nerve damage and incomplete drainage   Alternatives  discussed:  No treatment Location:    Location:  Wrist   Wrist:  R radiocarpal Anesthesia (see MAR for exact dosages):    Anesthesia method:  Local infiltration   Local anesthetic:  Lidocaine 2% WITH epi Procedure details:    Preparation: Patient was prepped and draped in usual sterile fashion     Needle gauge:  18 G    Ultrasound guidance: no     Approach:  Anterior   Aspirate amount:  0.5   Aspirate characteristics:  Clear   Steroid injected: no   Post-procedure details:    Dressing:  Adhesive bandage   Patient tolerance of procedure:  Tolerated well, no immediate complications Comments:     We were able to aspirate about 0.75 cc of synovial fluid from the right wrist. The fluid might not be sufficient for cell count and differential and cultures.  I have advised the lab to run test on crystals, and the remainder on culture if the fluid is insufficient.   (including critical care time)  Medications Ordered in ED Medications  benzocaine (HURRICAINE) 20 % oral spray (not administered)  lidocaine-EPINEPHrine (XYLOCAINE W/EPI) 2 %-1:200000 (PF) injection 20 mL (20 mLs Intradermal Given 12/23/17 1955)  sodium chloride 0.9 % bolus 1,000 mL (0 mLs Intravenous Stopped 12/23/17 2000)     Initial Impression / Assessment and Plan / ED Course  I have reviewed the triage vital signs and the nursing notes.  Pertinent labs & imaging results that were available during my care of the patient were reviewed by me and considered in my medical decision making (see chart for details).  Clinical Course as of Dec 24 17  Thu Dec 24, 2017  0018 Patient reports that she has no way of getting home and no one can driver. Patient is out of [place] candidate because she is able to ambulate with a cane. Patient will stay in the ED until picked up in the morning by family.  [AN]    Clinical Course User Index [AN] Varney Biles, MD    Patient comes in with chief complaint of right-sided paresthesia and weakness.  No objective evidence of weakness appreciated.  Patient is noted to have right sided wrist swelling.  Patient was informed that she has gout, there is no known history of gout.  Patient is diabetic and has unilateral swelling of her wrist, therefore we will attempt arthrocentesis.  Labs indicate worsening of  patient's renal status.  BUN today is 120, creatinine is 3.  Patient reports that she sees [facility] for her renal disease, and her last visit with nephrologist was 2 weeks ago.  CT scan of the head is normal.  Besides the renal function, rest of the labs are reassuring.  Patient has mild hyperglycemia without DKA.  We ordered MRI to ensure there was no stroke.  MRI team called and reported that patient will not for the MRI machine.  Given that the symptoms have been going on for more than 3 days, CT scan is normal, patient has no functional deficits per my exam, we will have patient see a neurologist as an outpatient for further evaluation of her right-sided subjective paresthesias.    Final Clinical Impressions(s) / ED Diagnoses   Final diagnoses:  Acute gout of right wrist, unspecified cause  CKD (chronic kidney disease) stage 4, GFR 15-29 ml/min (HCC)  Uremia  Paresthesia    ED Discharge Orders        Ordered    HYDROcodone-acetaminophen (NORCO/VICODIN) 5-325  MG tablet  Every 8 hours PRN     12/23/17 2254    predniSONE (DELTASONE) 10 MG tablet  Daily     12/23/17 2254       Varney Biles, MD 12/24/17 0018

## 2017-12-23 NOTE — ED Notes (Signed)
Dr.Nanaviti at bedside to evaluate pt

## 2017-12-23 NOTE — ED Notes (Signed)
MRI tech advised that pt was unable to perform MRI. Dr.Nanavati notified of result.

## 2017-12-23 NOTE — ED Notes (Signed)
Bed: WH87 Expected date:  Expected time:  Means of arrival:  Comments: BED OFF OF FLOOR

## 2017-12-23 NOTE — ED Notes (Signed)
ED Provider at bedside. 

## 2017-12-23 NOTE — ED Notes (Signed)
Patient transported to MRI 

## 2017-12-23 NOTE — ED Notes (Signed)
Pt is able to ambulate with assistance of cane and does not meet criteria for PTAR transport. Pt to be moved to hallway bed at this time for ride to arrive in the morning.

## 2017-12-23 NOTE — ED Notes (Signed)
ED Provider at bedside. EDP NANAVATI

## 2017-12-23 NOTE — ED Notes (Signed)
Pt currently in MRI at this time.

## 2017-12-23 NOTE — ED Notes (Signed)
MEDICATIONS AT BEDSIDE

## 2017-12-23 NOTE — ED Triage Notes (Addendum)
Per EMS, patient from home, c/o right arm pain and right leg pain x3 days. Denies recent fall, trauma, and injury. Patient reports she was seen by PCP for same and diagnosed with gout. Ambulatory with cane.

## 2017-12-24 NOTE — ED Notes (Signed)
Pt given given meal tray

## 2017-12-24 NOTE — ED Notes (Signed)
Awaiting transportation back to residence by family member.

## 2017-12-24 NOTE — ED Notes (Signed)
Pt placed on bedpan, given phone to call sister.

## 2017-12-27 LAB — BODY FLUID CULTURE: CULTURE: NO GROWTH

## 2018-01-25 ENCOUNTER — Encounter (HOSPITAL_COMMUNITY): Payer: Self-pay | Admitting: Emergency Medicine

## 2018-01-25 DIAGNOSIS — E1122 Type 2 diabetes mellitus with diabetic chronic kidney disease: Secondary | ICD-10-CM | POA: Diagnosis not present

## 2018-01-25 DIAGNOSIS — Z951 Presence of aortocoronary bypass graft: Secondary | ICD-10-CM | POA: Diagnosis not present

## 2018-01-25 DIAGNOSIS — N183 Chronic kidney disease, stage 3 (moderate): Secondary | ICD-10-CM | POA: Diagnosis not present

## 2018-01-25 DIAGNOSIS — Z794 Long term (current) use of insulin: Secondary | ICD-10-CM | POA: Diagnosis not present

## 2018-01-25 DIAGNOSIS — I130 Hypertensive heart and chronic kidney disease with heart failure and stage 1 through stage 4 chronic kidney disease, or unspecified chronic kidney disease: Secondary | ICD-10-CM | POA: Diagnosis not present

## 2018-01-25 DIAGNOSIS — I5042 Chronic combined systolic (congestive) and diastolic (congestive) heart failure: Secondary | ICD-10-CM | POA: Insufficient documentation

## 2018-01-25 DIAGNOSIS — I252 Old myocardial infarction: Secondary | ICD-10-CM | POA: Diagnosis not present

## 2018-01-25 DIAGNOSIS — Z79899 Other long term (current) drug therapy: Secondary | ICD-10-CM | POA: Insufficient documentation

## 2018-01-25 DIAGNOSIS — K5900 Constipation, unspecified: Secondary | ICD-10-CM | POA: Diagnosis present

## 2018-01-25 DIAGNOSIS — I2510 Atherosclerotic heart disease of native coronary artery without angina pectoris: Secondary | ICD-10-CM | POA: Diagnosis not present

## 2018-01-25 DIAGNOSIS — F17210 Nicotine dependence, cigarettes, uncomplicated: Secondary | ICD-10-CM | POA: Diagnosis not present

## 2018-01-25 DIAGNOSIS — Z7982 Long term (current) use of aspirin: Secondary | ICD-10-CM | POA: Insufficient documentation

## 2018-01-25 DIAGNOSIS — Z8673 Personal history of transient ischemic attack (TIA), and cerebral infarction without residual deficits: Secondary | ICD-10-CM | POA: Insufficient documentation

## 2018-01-25 NOTE — ED Triage Notes (Addendum)
Pt from home via EMS with c/o constipation x 1 week. Pt began having lower abd pain that is sharp and non radiating this morning. No distention. Pt is alert and oriented. Pt was also hyperglycemic with EMS, but reports she normally is in the evening as she takes her insulin at 2200 every evening. Pt denies emesis or nausea

## 2018-01-26 ENCOUNTER — Emergency Department (HOSPITAL_COMMUNITY): Payer: Medicare Other

## 2018-01-26 ENCOUNTER — Emergency Department (HOSPITAL_COMMUNITY)
Admission: EM | Admit: 2018-01-26 | Discharge: 2018-01-26 | Disposition: A | Discharge status: Home / Self Care | Payer: Medicare Other | Attending: Emergency Medicine | Admitting: Emergency Medicine

## 2018-01-26 DIAGNOSIS — K5900 Constipation, unspecified: Secondary | ICD-10-CM | POA: Diagnosis not present

## 2018-01-26 HISTORY — DX: Gout, unspecified: M10.9

## 2018-01-26 LAB — URINALYSIS, ROUTINE W REFLEX MICROSCOPIC
BILIRUBIN URINE: NEGATIVE
GLUCOSE, UA: NEGATIVE mg/dL
HGB URINE DIPSTICK: NEGATIVE
KETONES UR: NEGATIVE mg/dL
Leukocytes, UA: NEGATIVE
NITRITE: NEGATIVE
PH: 5 (ref 5.0–8.0)
Protein, ur: NEGATIVE mg/dL
Specific Gravity, Urine: 1.012 (ref 1.005–1.030)

## 2018-01-26 MED ORDER — POLYETHYLENE GLYCOL 3350 17 G PO PACK: 17.0000 g | PACK | Freq: Every day | ORAL | 0 refills | Status: DC

## 2018-01-26 MED ORDER — POLYETHYLENE GLYCOL 3350 17 G PO PACK
17.0000 g | PACK | Freq: Once | ORAL | Status: AC
  Administered 2018-01-26: 17 g via ORAL
  Filled 2018-01-26: qty 1

## 2018-01-26 NOTE — ED Provider Notes (Signed)
[name] [name] Provider Note   CSN: [id number] Arrival date & time: 01/25/18  2034     History   Chief Complaint Chief Complaint  Patient presents with  . Constipation    HPI [name] [name] is a 67 y.o. female.  HPI  This is a 67 year old female with a history of chronic kidney disease, diabetes, hypertension, CVA who presents with constipation.  Patient presents with 1 week history of constipation.  Reports that she has not had a bowel movement in over 1 week; however, she reports that while waiting for assessment, she did have a close to normal bowel movement.  She states that her symptoms markedly improved after this bowel movement.  She denies at this time any abdominal pain, nausea, vomiting, dysuria, hematuria, flank pain.  No fevers.  Patient reports she is on multiple constipation medications.  She did take a laxative prior to coming to the hospital.  Past Medical History:  Diagnosis Date  . Anemia 08/2016  . Arthritis of left shoulder region 03/23/2013  . Chronic combined systolic and diastolic CHF (congestive heart failure) (HCC)    a. EF 40-45%, mild LVH, mid apicalanteroseptal and apical HK.  . CKD (chronic kidney disease), stage III ([place])   . Cocaine abuse ([place])    crack cocaine heavily until 2008 then sporadic use since then  . Coronary artery disease    a. 06/2012 NSTEMI/CABG x 3 (LIMA->LAD, VG->OM2, VG->LCX);  b. 04/2015 MV: EF<30%, mid ant, apicalanterior, apical infarct;  c. 04/2015 Cath: LM nl, LAD 90p, LCX 94m, OM1 min irregs, RCA mild dzs, LIMA->LAD nl w/ dist LAD dzs, VG->OM2 nl, VG->LCX nl-->Med Rx.  . CVA (cerebral infarction)    a. right internal capsule stroke in 12/2006  . Diabetes mellitus    diagnosed in 2008  . Essential hypertension   . Glaucoma   . Gout   . Hyperlipidemia   . Left-sided sensory deficit present   . PVD (peripheral vascular disease) ([place])    a. 06/2012 ABI's: R - 0.73, L - 0.71.  Marland Kitchen Shortness of breath  dyspnea   . Stroke ([place])   . Thyroid nodule    FNA in 2376 showed follicular cells but not definate neoplasm  . Tobacco abuse     Patient Active Problem List   Diagnosis Date Noted  . Fever   . Symptomatic anemia 09/16/2016  . Diabetes mellitus with complication ([place]) 28/31/5176  . Obesity 04/29/2016  . Hiatal hernia   . Heme + stool   . Absolute anemia   . Bleeding gastrointestinal   . Insulin dependent diabetes mellitus ([place])   . Pain in the chest   . Demand ischemia ([place])   . Iron deficiency anemia   . Precordial pain   . Chest pain 04/17/2016  . Thrombocytosis ([place]) 04/17/2016  . Microcytic anemia 02/05/2016  . Abscess   . Acute systolic heart failure ([place])   . Acute on chronic renal failure ([place])   . CAD in native artery   . Substance abuse ([place])   . Chronic combined systolic and diastolic CHF (congestive heart failure) ([place])   . CKD (chronic kidney disease) stage 4, GFR 15-29 ml/min (HCC)   . Demand ischemia of myocardium - related to Acue HF exacerbation 05/14/2015  . Morbid obesity ([place])   . Acute on chronic combined systolic and diastolic congestive heart failure ([place]) 05/12/2015  . Acute respiratory failure ([place]) 10/18/2014  . Type 2 diabetes mellitus with renal manifestations, controlled (  [place]) 10/18/2014  . S/P CABG (coronary artery bypass graft) 09/21/2013  . Arthritis of right shoulder region 03/23/2013  . Tobacco abuse   . Coronary artery disease   . NSTEMI (non-ST elevated myocardial infarction) ([place]) 06/29/2012  . History of cocaine abuse 06/29/2012  . Acute kidney injury ([place]) 06/29/2012  . Essential hypertension 06/29/2012  . RBC microcytosis 06/29/2012  . Glaucoma   . Hyperlipidemia   . History of CVA (cerebrovascular accident)     Past Surgical History:  Procedure Laterality Date  . CARDIAC CATHETERIZATION    . CARDIAC CATHETERIZATION N/A 05/17/2015   Procedure: Left Heart Cath and Cors/Grafts Angiography;  Surgeon: [name], MD;   Location: [facility];  Service: Cardiovascular;  Laterality: N/A;  . COLONOSCOPY WITH PROPOFOL N/A 04/21/2016   Procedure: COLONOSCOPY WITH PROPOFOL;  Surgeon: Irene Shipper, MD;  Location: [facility];  Service: Endoscopy;  Laterality: N/A;  . CORONARY ARTERY BYPASS GRAFT  07/09/2012   Procedure: CORONARY ARTERY BYPASS GRAFTING (CABG);  Surgeon: Ivin Poot, MD;  Location: [facility];  Service: Open Heart Surgery;  Laterality: N/A;  . ESOPHAGOGASTRODUODENOSCOPY N/A 04/20/2016   Procedure: ESOPHAGOGASTRODUODENOSCOPY (EGD);  Surgeon: Gatha Mayer, MD;  Location[facility] ENDOSCOPY;  Service: Endoscopy;  Laterality: N/A;  . LEFT HEART CATHETERIZATION WITH CORONARY ANGIOGRAM N/A 06/29/2012   Procedure: LEFT HEART CATHETERIZATION WITH CORONARY ANGIOGRAM;  Surgeon: Peter M Martinique, MD;  Location: [facility] CATH LAB;  Service: Cardiovascular;  Laterality: N/A;  . STERNAL WOUND DEBRIDEMENT  08/17/2012   Procedure: STERNAL WOUND DEBRIDEMENT;  Surgeon: Ivin Poot, MD;  Location: [facility] OR;  Service: Thoracic;  Laterality: N/A;  wound vac application  . STERNAL WOUND DEBRIDEMENT  08/24/2012   Procedure: STERNAL WOUND DEBRIDEMENT;  Surgeon: Ivin Poot, MD;  Location: [facility] [place] [facility];  Service: Thoracic;  Laterality: N/A;  . STERNAL WOUND DEBRIDEMENT  09/01/2012   Procedure: STERNAL WOUND DEBRIDEMENT;  Surgeon: Ivin Poot, MD;  Location: [facility];  Service: Thoracic;  Laterality: N/A;  . STERNAL WOUND DEBRIDEMENT  09/20/2012   Procedure: STERNAL WOUND DEBRIDEMENT;  Surgeon: Ivin Poot, MD;  Location: [facility] OR;  Service: Thoracic;  Laterality: N/A;  wound vac change    OB History    No data available       Home Medications    Prior to Admission medications   Medication Sig Start Date End Date Taking? Authorizing Provider  allopurinol (ZYLOPRIM) 100 MG tablet Take 100 mg by mouth daily. 11/25/17  Yes [provider]  aspirin 325 MG tablet Take 325 mg by mouth daily.   Yes [provider]    brinzolamide (AZOPT) 1 % ophthalmic suspension Place 1 drop into both eyes 3 (three) times daily.   Yes [provider]  cetirizine (ZYRTEC) 10 MG tablet Take 10 mg by mouth daily as needed for allergies.    Yes [provider]  clotrimazole-betamethasone (LOTRISONE) cream Apply 1 application topically 2 (two) times daily. On left hand between fingers 09/15/16  Yes [provider]  ferrous sulfate 325 (65 FE) MG tablet Take 1 tablet (325 mg total) by mouth 2 (two) times daily with a meal. Patient taking differently: Take 325 mg by mouth 3 (three) times daily with meals.  04/21/16  Yes Bonnielee Haff, MD  Fluticasone-Salmeterol (ADVAIR) 250-50 MCG/DOSE AEPB Inhale 1 puff into the lungs every 12 (twelve) hours.   Yes [provider]  furosemide (LASIX) 40 MG tablet Take 1 tablet (40 mg total) by mouth  daily. Take additional dose if you experience weight gain. 05/17/15  Yes Mikhail, Maryann, DO  gabapentin (NEURONTIN) 300 MG capsule Take 300 mg by mouth at bedtime.  01/31/16  Yes [provider]  glimepiride (AMARYL) 4 MG tablet Take 4 mg by mouth daily. 04/07/16  Yes [provider]  Insulin Glargine (LANTUS SOLOSTAR) 100 UNIT/ML Solostar Pen Inject 25 Units into the skin daily at 10 pm. Patient taking differently: Inject 21 Units into the skin daily at 10 pm.  01/16/16  Yes Mikhail, Velta Addison, DO  latanoprost (XALATAN) 0.005 % ophthalmic solution Place 1 drop into both eyes at bedtime.   Yes [provider]  LINZESS 72 MCG capsule TAKE  1  CAPSULE BY MOUTH ONCE DAILY ON AN EMPTY STOMACH AT LEAST 30 MINUTES BEFORE FIRST MEAL OF THE DAY 11/21/17  Yes [provider]  metolazone (ZAROXOLYN) 5 MG tablet Take 5 mg by mouth daily. 11/06/17  Yes [provider]  metoprolol tartrate (LOPRESSOR) 25 MG tablet Take 0.5 tablets (12.5 mg total) by mouth 2 (two) times daily. Patient taking differently: Take 25 mg by mouth 2 (two) times daily.   01/16/16  Yes Mikhail, Maryann, DO  pravastatin (PRAVACHOL) 40 MG tablet Take 40 mg by mouth at bedtime.   Yes [provider]  PROAIR HFA 108 (90 Base) MCG/ACT inhaler INHALE 1 TO 2 PUFFS BY MOUTH EVERY 6 HOURS AS NEEDED 11/17/17  Yes [provider]  traMADol (ULTRAM) 50 MG tablet Take 50 mg by mouth 3 (three) times daily.    Yes [provider]  buPROPion (WELLBUTRIN SR) 150 MG 12 hr tablet Take 1 tablet (150 mg total) by mouth 2 (two) times daily. Patient not taking: Reported on 01/26/2018 01/16/16   Cristal Ford, DO  HYDROcodone-acetaminophen (NORCO/VICODIN) 5-325 MG tablet Take 1 tablet by mouth every 8 (eight) hours as needed. Patient not taking: Reported on 01/26/2018 12/23/17   Varney Biles, MD  pantoprazole (PROTONIX) 40 MG tablet Take 1 tablet (40 mg total) by mouth daily. Patient not taking: Reported on 01/26/2018 09/23/16   Thurnell Lose, MD  polyethylene glycol [facility] / GLYCOLAX) packet Take 17 g by mouth daily. 01/26/18   Horton, Barbette Hair, MD  predniSONE (DELTASONE) 10 MG tablet Take 6 tablets (60 mg total) by mouth daily. Patient not taking: Reported on 01/26/2018 12/23/17   Varney Biles, MD    Family History Family History  Problem Relation Age of Onset  . Diabetes Mother   . Hypertension Mother   . Hyperlipidemia Father   . Other Unknown        no known family CAD    Social History Social History   Tobacco Use  . Smoking status: Current Some Day Smoker    Packs/day: 0.25    Years: 50.00    Pack years: 12.50  . Smokeless tobacco: Never Used  Substance Use Topics  . Alcohol use: No    Alcohol/week: 0.0 oz  . Drug use: No    Comment: pt denies at current time     Allergies   Naproxen   Review of Systems Review of Systems  Constitutional: Negative for fever.  Respiratory: Negative for shortness of breath.   Cardiovascular: Negative for chest pain.  Gastrointestinal: Positive for constipation. Negative for abdominal  pain, diarrhea, nausea and vomiting.  Genitourinary: Negative for dysuria and hematuria.  All other systems reviewed and are negative.    Physical Exam Updated Vital Signs BP (!) 142/77   Pulse 79  Temp 98.3 F (36.8 C) (Oral)   Resp 20   SpO2 100%   Physical Exam  Constitutional: She is oriented to person, place, and time.  Morbidly obese, no acute distress  HENT:  Head: Normocephalic and atraumatic.  Cardiovascular: Normal rate, regular rhythm and normal heart sounds.  Pulmonary/Chest: Effort normal. No respiratory distress. She has no wheezes.  Abdominal: Soft. Bowel sounds are normal. There is no tenderness. There is no guarding.  Neurological: She is alert and oriented to person, place, and time.  Skin: Skin is warm and dry.  Psychiatric: She has a normal mood and affect.  Nursing note and vitals reviewed.    ED Treatments / Results  Labs (all labs ordered are listed, but only abnormal results are displayed) Labs Reviewed  URINALYSIS, ROUTINE W REFLEX MICROSCOPIC    EKG  EKG Interpretation None       Radiology Dg Abdomen 1 View  Result Date: 01/26/2018 CLINICAL DATA:  Constipation for 1 week.  Lower abdominal pain. EXAM: ABDOMEN - 1 VIEW COMPARISON:  CT abdomen and pelvis 09/18/2016 FINDINGS: Scattered gas and stool throughout the colon. No small or large bowel distention. No radiopaque stones. Multiple calcified phleboliths in the pelvis. Vascular calcifications. Degenerative changes in the spine and hips. IMPRESSION: Nonobstructive bowel gas pattern with stool throughout the colon. Electronically Signed   By: Lucienne Capers M.D.   On: 01/26/2018 03:33    Procedures Procedures (including critical care time)  Medications Ordered in ED Medications  polyethylene glycol (MIRALAX / GLYCOLAX) packet 17 g (17 g Oral Given 01/26/18 0348)     Initial Impression / Assessment and Plan / ED Course  I have reviewed the triage vital signs and the nursing  notes.  Pertinent labs & imaging results that were available during my care of the patient were reviewed by me and considered in my medical decision making (see chart for details).     Patient presents with constipation.  Symptoms improved after having a bowel movement in the emergency department.  She is currently asymptomatic.  Vital signs reassuring.  I am only notable for morbid obesity.  Abdomen is nontender.  Urinalysis shows no evidence of tract infection.  KUB shows stool burden without evidence of obstruction.  Recommend MiraLAX daily for regular bowel movements.  After history, exam, and medical workup I feel the patient has been appropriately medically screened and is safe for discharge home. Pertinent diagnoses were discussed with the patient. Patient was given return precautions.   Final Clinical Impressions(s) / ED Diagnoses   Final diagnoses:  Constipation, unspecified constipation type    ED Discharge Orders        Ordered    polyethylene glycol (MIRALAX / GLYCOLAX) packet  Daily     01/26/18 6144       Merryl Hacker, MD 01/26/18 [id number]

## 2018-01-26 NOTE — ED Notes (Signed)
Pt has an inflamed red area on her right forearm that the pt sts happened after EKG stickers was removed from the area. Ice pack was applied to help reduce swelling.

## 2018-01-26 NOTE — ED Notes (Signed)
Pt aware that urine specimen is needed  

## 2018-04-30 ENCOUNTER — Other Ambulatory Visit: Payer: Self-pay | Admitting: Internal Medicine

## 2018-04-30 DIAGNOSIS — Z1231 Encounter for screening mammogram for malignant neoplasm of breast: Secondary | ICD-10-CM

## 2018-05-21 ENCOUNTER — Ambulatory Visit
Admission: RE | Admit: 2018-05-21 | Discharge: 2018-05-21 | Disposition: A | Discharge status: Home / Self Care | Payer: Medicare Other | Source: Ambulatory Visit | Attending: Internal Medicine | Admitting: Internal Medicine

## 2018-05-21 DIAGNOSIS — Z1231 Encounter for screening mammogram for malignant neoplasm of breast: Secondary | ICD-10-CM

## 2018-06-15 ENCOUNTER — Other Ambulatory Visit: Payer: Self-pay | Admitting: Internal Medicine

## 2018-06-15 DIAGNOSIS — I872 Venous insufficiency (chronic) (peripheral): Secondary | ICD-10-CM

## 2018-08-12 ENCOUNTER — Ambulatory Visit
Admission: RE | Admit: 2018-08-12 | Discharge: 2018-08-12 | Disposition: A | Discharge status: Home / Self Care | Payer: Medicare Other | Source: Ambulatory Visit | Attending: Internal Medicine | Admitting: Internal Medicine

## 2018-08-12 DIAGNOSIS — I872 Venous insufficiency (chronic) (peripheral): Secondary | ICD-10-CM

## 2018-08-12 NOTE — Consult Note (Signed)
Chief Complaint: Patient was seen in consultation today for  Chief Complaint  Patient presents with  . Consult    Venous Insufficiency   at the request of Dean,Eric L  Referring Physician(s): Dean,Eric L  History of Present Illness: [name] [name] is a 67 y.o. female with a past medical history significant for coronary bypass grafting with saphenous vein harvest 2013, who presents with 6 years of bilateral lower extremity edema and pain, left greater than right.  No previous varicose or spider vein treatments.  No history of DVT, superficial thrombophlebitis, or pulmonary embolism.  No family history of varicose/spider veins.  History of oral contraceptive pills, not currently taking.  She does occasionally use thigh-high compression hose with some relief of symptoms.  She is on disability x15 years.  She sits for extended periods during the day.  She has some difficulty ambulating, requiring regular use of a cane..  She uses gabapentin 3 times daily for pain control.  She does find that elevation does help her leg symptoms.  No history of lower extremity ulceration.  Past Medical History:  Diagnosis Date  . Anemia 08/2016  . Arthritis of left shoulder region 03/23/2013  . Chronic combined systolic and diastolic CHF (congestive heart failure) (HCC)    a. EF 40-45%, mild LVH, mid apicalanteroseptal and apical HK.  . CKD (chronic kidney disease), stage III ([place])   . Cocaine abuse ([place])    crack cocaine heavily until 2008 then sporadic use since then  . Coronary artery disease    a. 06/2012 NSTEMI/CABG x 3 (LIMA->LAD, VG->OM2, VG->LCX);  b. 04/2015 MV: EF<30%, mid ant, apicalanterior, apical infarct;  c. 04/2015 Cath: LM nl, LAD 90p, LCX 60m, OM1 min irregs, RCA mild dzs, LIMA->LAD nl w/ dist LAD dzs, VG->OM2 nl, VG->LCX nl-->Med Rx.  . CVA (cerebral infarction)    a. right internal capsule stroke in 12/2006  . Diabetes mellitus    diagnosed in 2008  . Essential hypertension   .  Glaucoma   . Gout   . Hyperlipidemia   . Left-sided sensory deficit present   . PVD (peripheral vascular disease) ([place])    a. 06/2012 ABI's: R - 0.73, L - 0.71.  Marland Kitchen Shortness of breath dyspnea   . Stroke ([place])   . Thyroid nodule    FNA in 1062 showed follicular cells but not definate neoplasm  . Tobacco abuse     Past Surgical History:  Procedure Laterality Date  . CARDIAC CATHETERIZATION    . CARDIAC CATHETERIZATION N/A 05/17/2015   Procedure: Left Heart Cath and Cors/Grafts Angiography;  Surgeon: [name], MD;  Location: [place] [facility];  Service: Cardiovascular;  Laterality: N/A;  . COLONOSCOPY WITH PROPOFOL N/A 04/21/2016   Procedure: COLONOSCOPY WITH PROPOFOL;  Surgeon: Irene Shipper, MD;  Location: [place];  Service: Endoscopy;  Laterality: N/A;  . CORONARY ARTERY BYPASS GRAFT  07/09/2012   Procedure: CORONARY ARTERY BYPASS GRAFTING (CABG);  Surgeon: Ivin Poot, MD;  Location: [place];  Service: Open Heart Surgery;  Laterality: N/A;  . ESOPHAGOGASTRODUODENOSCOPY N/A 04/20/2016   Procedure: ESOPHAGOGASTRODUODENOSCOPY (EGD);  Surgeon: Gatha Mayer, MD;  Location: [facility] ENDOSCOPY;  Service: Endoscopy;  Laterality: N/A;  . LEFT HEART CATHETERIZATION WITH CORONARY ANGIOGRAM N/A 06/29/2012   Procedure: LEFT HEART CATHETERIZATION WITH CORONARY ANGIOGRAM;  Surgeon: Peter M Martinique, MD;  Location: [facility] CATH LAB;  Service: Cardiovascular;  Laterality: N/A;  . STERNAL WOUND DEBRIDEMENT  08/17/2012   Procedure: STERNAL WOUND DEBRIDEMENT;  Surgeon: Ivin Poot, MD;  Location: [facility];  Service: Thoracic;  Laterality: N/A;  wound vac application  . STERNAL WOUND DEBRIDEMENT  08/24/2012   Procedure: STERNAL WOUND DEBRIDEMENT;  Surgeon: Ivin Poot, MD;  Location: [place];  Service: Thoracic;  Laterality: N/A;  . STERNAL WOUND DEBRIDEMENT  09/01/2012   Procedure: STERNAL WOUND DEBRIDEMENT;  Surgeon: Ivin Poot, MD;  Location: [place];  Service: Thoracic;  Laterality: N/A;  . STERNAL  WOUND DEBRIDEMENT  09/20/2012   Procedure: STERNAL WOUND DEBRIDEMENT;  Surgeon: Ivin Poot, MD;  Location: [facility] OR;  Service: Thoracic;  Laterality: N/A;  wound vac change    Allergies: Naproxen  Medications: Prior to Admission medications   Medication Sig Start Date End Date Taking? Authorizing Provider  allopurinol (ZYLOPRIM) 100 MG tablet Take 100 mg by mouth daily. 11/25/17  Yes [provider]  aspirin 325 MG tablet Take 325 mg by mouth daily.   Yes [provider]  brinzolamide (AZOPT) 1 % ophthalmic suspension Place 1 drop into both eyes 3 (three) times daily.   Yes [provider]  buPROPion (WELLBUTRIN SR) 150 MG 12 hr tablet Take 1 tablet (150 mg total) by mouth 2 (two) times daily. 01/16/16  Yes Mikhail, Velta Addison, DO  cetirizine (ZYRTEC) 10 MG tablet Take 10 mg by mouth daily as needed for allergies.    Yes [provider]  ferrous sulfate 325 (65 FE) MG tablet Take 1 tablet (325 mg total) by mouth 2 (two) times daily with a meal. Patient taking differently: Take 325 mg by mouth 3 (three) times daily with meals.  04/21/16  Yes Bonnielee Haff, MD  Fluticasone-Salmeterol (ADVAIR) 250-50 MCG/DOSE AEPB Inhale 1 puff into the lungs every 12 (twelve) hours.   Yes [provider]  furosemide (LASIX) 40 MG tablet Take 1 tablet (40 mg total) by mouth daily. Take additional dose if you experience weight gain. 05/17/15  Yes Mikhail, Maryann, DO  gabapentin (NEURONTIN) 300 MG capsule Take 300 mg by mouth at bedtime.  01/31/16  Yes [provider]  glimepiride (AMARYL) 4 MG tablet Take 4 mg by mouth daily. 04/07/16  Yes [provider]  Insulin Glargine (LANTUS SOLOSTAR) 100 UNIT/ML Solostar Pen Inject 25 Units into the skin daily at 10 pm. Patient taking differently: Inject 21 Units into the skin daily at 10 pm.  01/16/16  Yes Mikhail, Velta Addison, DO  latanoprost (XALATAN) 0.005 % ophthalmic solution Place 1 drop into both eyes at bedtime.    Yes [provider]  LINZESS 72 MCG capsule TAKE  1  CAPSULE BY MOUTH ONCE DAILY ON AN EMPTY STOMACH AT LEAST 30 MINUTES BEFORE FIRST MEAL OF THE DAY 11/21/17  Yes [provider]  metolazone (ZAROXOLYN) 5 MG tablet Take 5 mg by mouth daily. 11/06/17  Yes [provider]  metoprolol tartrate (LOPRESSOR) 25 MG tablet Take 0.5 tablets (12.5 mg total) by mouth 2 (two) times daily. Patient taking differently: Take 25 mg by mouth 2 (two) times daily.  01/16/16  Yes Mikhail, Maryann, DO  pantoprazole (PROTONIX) 40 MG tablet Take 1 tablet (40 mg total) by mouth daily. 09/23/16  Yes Thurnell Lose, MD  polyethylene glycol (MIRALAX / GLYCOLAX) packet Take 17 g by mouth daily. 01/26/18  Yes Horton, Barbette Hair, MD  pravastatin (PRAVACHOL) 40 MG tablet Take 40 mg by mouth at bedtime.   Yes [provider]  PROAIR HFA 108 (90 Base) MCG/ACT inhaler INHALE 1 TO 2 PUFFS BY  MOUTH EVERY 6 HOURS AS NEEDED 11/17/17  Yes [provider]  clotrimazole-betamethasone (LOTRISONE) cream Apply 1 application topically 2 (two) times daily. On left hand between fingers 09/15/16   [provider]  HYDROcodone-acetaminophen (NORCO/VICODIN) 5-325 MG tablet Take 1 tablet by mouth every 8 (eight) hours as needed. Patient not taking: Reported on 01/26/2018 12/23/17   Varney Biles, MD  predniSONE (DELTASONE) 10 MG tablet Take 6 tablets (60 mg total) by mouth daily. Patient not taking: Reported on 01/26/2018 12/23/17   Varney Biles, MD  traMADol (ULTRAM) 50 MG tablet Take 50 mg by mouth 3 (three) times daily.     [provider]     Family History  Problem Relation Age of Onset  . Diabetes Mother   . Hypertension Mother   . Hyperlipidemia Father   . Other Unknown        no known family CAD    Social History   Socioeconomic History  . Marital status: Single    Spouse name: Not on file  . Number of children: Not on file  . Years of education: Not on file  .  Highest education level: Not on file  Occupational History  . Not on file  Social Needs  . Financial resource strain: Not on file  . Food insecurity:    Worry: Not on file    Inability: Not on file  . Transportation needs:    Medical: Not on file    Non-medical: Not on file  Tobacco Use  . Smoking status: Current Some Day Smoker    Packs/day: 0.25    Years: 50.00    Pack years: 12.50  . Smokeless tobacco: Never Used  Substance and Sexual Activity  . Alcohol use: No    Alcohol/week: 0.0 standard drinks  . Drug use: No    Types: Cocaine    Comment: pt denies at current time  . Sexual activity: Never  Lifestyle  . Physical activity:    Days per week: Not on file    Minutes per session: Not on file  . Stress: Not on file  Relationships  . Social connections:    Talks on phone: Not on file    Gets together: Not on file    Attends religious service: Not on file    Active member of club or organization: Not on file    Attends meetings of clubs or organizations: Not on file    Relationship status: Not on file  Other Topics Concern  . Not on file  Social History Narrative   Lives with boyfriend of 20 years.     ECOG Status: 2 - Symptomatic, <50% confined to bed   Review of Systems  Review of Systems: A 12 point ROS discussed and pertinent positives are indicated in the HPI above.  All other systems are negative.   Physical Exam Vital Signs: BP (!) 127/57   Pulse (!) 50   Temp 98.7 F (37.1 C) (Oral)   Resp 15   Ht 5\' 8"  (1.727 m)   Wt 123.4 kg   SpO2 98%   BMI 41.36 kg/m   Constitutional: Oriented to person, place, and time.  Obese, well-developed and well-nourished. No distress.  Last Weight  Most recent update: 08/12/2018  9:44 AM   Weight  123.4 kg (272 lb)           HENT:  Head: Normocephalic and atraumatic.  Eyes: Conjunctivae and EOM are normal. Right eye exhibits no discharge. Left eye  exhibits no discharge. No scleral icterus.  Neck: No JVD  present.  Pulmonary/Chest: Effort normal. No stridor. No respiratory distress.  Abdomen: soft, non distended, protuberant pannus Neurological:  alert and oriented to person, place, and time.  Skin: Skin is warm and dry.  not diaphoretic.  No visible lower extremity varicose veins. Psychiatric:   normal mood and affect.   behavior is normal. Judgment and thought content normal.       Imaging: US Venous Img Lower Bilateral  Result Date: 08/12/2018 CLINICAL DATA:  Previous CABG with lower extremity vein harvest x1 on the right, x2 on the left approximately 2006. Subsequent bilateral lower extremity edema and pain, left greater than right. EXAM: BILATERAL LOWER EXTREMITY VENOUS DOPPLER ULTRASOUND TECHNIQUE: Gray-scale sonography with graded compression, as well as color Doppler and duplex ultrasound, were performed to evaluate the deep and superficial veins of both lower extremities. Spectral Doppler was utilized to evaluate flow at rest and with distal augmentation maneuvers. A complete superficial venous insufficiency exam was performed in the upright standing position. I personally performed the technical portion of the exam. COMPARISON:  None. FINDINGS: Deep Venous System: Evaluation of the deep venous system including the common femoral, femoral, profunda femoral, popliteal and calf veins (where visible) demonstrate no evidence of deep venous thrombosis. The vessels are compressible and demonstrate normal respiratory phasicity and response to augmentation. No evidence of the deep venous reflux. Superficial Venous System: RIGHT SFJ: Patent, normal in caliber, no reflux post augmentation GSV Prox Thigh: Patent, normal in caliber, no reflux GSV Mid Thigh: Negative GSV Lower Thigh: Negative GSV at Knee: Negative GSV Prox Calf: Negative GSV Mid Calf: Negative SPJ: Not visualized SSV knee: Patent, normal in caliber, no reflux post augmentation SSV proximal calf: Negative SSV Mid: Negative SSV Distal: Negative  LEFT SFJ: Patent, normal in caliber, no reflux post augmentation GSV Prox Thigh: Not visualized GSV Mid Thigh: Not visualized GSV Lower Thigh: Not visualized GSV at Knee: Not visualized GSV Prox Calf: Patent, normal in caliber, no reflux post augmentation GSV Mid Calf: Negative GSV Distal Calf: Negative SPJ: Not visualized SSV Prox: Patent, normal in caliber, no reflux post augmentation SSV Mid: Negative SSV Distal: Negative Other: None IMPRESSION: 1. Negative bilateral lower extremity deep venous systems. No evidence of acute or chronic DVT. 2. Negative bilateral saphenous venous systems. No evidence of valvular incompetence, reflux, or varicose veins. 3. Nonvisualization of the right great saphenous vein from the proximal calf to the saphenofemoral junction, consistent with history of surgical vein harvest. Electronically Signed   By: Lucrezia Europe M.D.   On: 08/12/2018 12:47   [place] Rad Eval And Mgmt  Result Date: 08/12/2018 Please refer to "Notes" to see consult details.   Labs:  CBC: Recent Labs    12/23/17 1700  WBC 14.7*  HGB 9.3*  HCT 30.1*  PLT 374    COAGS: No results for input(s): INR, APTT in the last 8760 hours.  BMP: Recent Labs    12/23/17 1700  NA 133*  K 4.0  CL 94*  CO2 26  GLUCOSE 283*  BUN 120*  CALCIUM 9.1  CREATININE 3.01*  GFRNONAA 15*  GFRAA 18*    LIVER FUNCTION TESTS: No results for input(s): BILITOT, AST, ALT, ALKPHOS, PROT, ALBUMIN in the last 8760 hours.  TUMOR MARKERS: No results for input(s): AFPTM, CEA, CA199, CHROMGRNA in the last 8760 hours.  Assessment and Plan:  My impression is that this patient's bilateral lower extremity pain and swelling is not related  to lower extremity venous pathology.  The deep venous system shows no evidence of acute or chronic  DVT.  Saphenous venous systems showed no valvular incompetence of reflux; the left GSV is surgically absent in the thigh. I reviewed with the patient the pathophysiology of venous valvular  incompetence, reflux, and varicose vein formation with possible associated symptoms.  We discussed differential diagnosis for lower extremity swelling and pain.  We reviewed the venous ultrasound findings.  She seemed to understand, and had her questions answered.  I did encourage continued use of her graded compression hose as they do seem to help her symptoms.  Thank you for this interesting consult.  I greatly enjoyed meeting [name] [name] and look forward to participating in their care.  There is no indication for lower extremity venous intervention at this time. She will follow-up primarily with you for  further evaluation of her lower extremity symptoms.    A copy of this report was sent to the requesting provider on this date.  Electronically Signed: Rickard Rhymes 08/12/2018, 12:54 PM   I spent a total of  40 Minutes   in face to face in clinical consultation, greater than 50% of which was counseling/coordinating care for bilateral lower extremity swelling and pain.

## 2019-02-08 ENCOUNTER — Encounter: Payer: Self-pay | Admitting: Family Medicine

## 2019-03-25 ENCOUNTER — Ambulatory Visit: Payer: Medicare Other | Admitting: Internal Medicine

## 2019-04-27 ENCOUNTER — Other Ambulatory Visit: Payer: Self-pay

## 2019-04-27 ENCOUNTER — Inpatient Hospital Stay (HOSPITAL_COMMUNITY)
Admission: EM | Admit: 2019-04-27 | Discharge: 2019-04-30 | DRG: 280 | Disposition: A | Discharge status: Home Health | Payer: Medicare Other | Attending: Internal Medicine | Admitting: Internal Medicine

## 2019-04-27 ENCOUNTER — Emergency Department (HOSPITAL_COMMUNITY): Payer: Medicare Other

## 2019-04-27 DIAGNOSIS — J9621 Acute and chronic respiratory failure with hypoxia: Secondary | ICD-10-CM | POA: Diagnosis not present

## 2019-04-27 DIAGNOSIS — J440 Chronic obstructive pulmonary disease with acute lower respiratory infection: Secondary | ICD-10-CM | POA: Diagnosis present

## 2019-04-27 DIAGNOSIS — Z8673 Personal history of transient ischemic attack (TIA), and cerebral infarction without residual deficits: Secondary | ICD-10-CM

## 2019-04-27 DIAGNOSIS — R7989 Other specified abnormal findings of blood chemistry: Secondary | ICD-10-CM

## 2019-04-27 DIAGNOSIS — J181 Lobar pneumonia, unspecified organism: Secondary | ICD-10-CM | POA: Diagnosis present

## 2019-04-27 DIAGNOSIS — Z841 Family history of disorders of kidney and ureter: Secondary | ICD-10-CM

## 2019-04-27 DIAGNOSIS — R0602 Shortness of breath: Secondary | ICD-10-CM

## 2019-04-27 DIAGNOSIS — E1122 Type 2 diabetes mellitus with diabetic chronic kidney disease: Secondary | ICD-10-CM | POA: Diagnosis present

## 2019-04-27 DIAGNOSIS — E1129 Type 2 diabetes mellitus with other diabetic kidney complication: Secondary | ICD-10-CM

## 2019-04-27 DIAGNOSIS — A419 Sepsis, unspecified organism: Secondary | ICD-10-CM

## 2019-04-27 DIAGNOSIS — F1411 Cocaine abuse, in remission: Secondary | ICD-10-CM | POA: Diagnosis present

## 2019-04-27 DIAGNOSIS — I214 Non-ST elevation (NSTEMI) myocardial infarction: Principal | ICD-10-CM | POA: Diagnosis present

## 2019-04-27 DIAGNOSIS — F1910 Other psychoactive substance abuse, uncomplicated: Secondary | ICD-10-CM | POA: Diagnosis present

## 2019-04-27 DIAGNOSIS — M109 Gout, unspecified: Secondary | ICD-10-CM | POA: Diagnosis present

## 2019-04-27 DIAGNOSIS — I10 Essential (primary) hypertension: Secondary | ICD-10-CM | POA: Diagnosis present

## 2019-04-27 DIAGNOSIS — I2510 Atherosclerotic heart disease of native coronary artery without angina pectoris: Secondary | ICD-10-CM | POA: Diagnosis present

## 2019-04-27 DIAGNOSIS — I252 Old myocardial infarction: Secondary | ICD-10-CM

## 2019-04-27 DIAGNOSIS — Z823 Family history of stroke: Secondary | ICD-10-CM

## 2019-04-27 DIAGNOSIS — Z79899 Other long term (current) drug therapy: Secondary | ICD-10-CM

## 2019-04-27 DIAGNOSIS — E785 Hyperlipidemia, unspecified: Secondary | ICD-10-CM | POA: Diagnosis present

## 2019-04-27 DIAGNOSIS — R778 Other specified abnormalities of plasma proteins: Secondary | ICD-10-CM | POA: Diagnosis present

## 2019-04-27 DIAGNOSIS — F1410 Cocaine abuse, uncomplicated: Secondary | ICD-10-CM | POA: Diagnosis present

## 2019-04-27 DIAGNOSIS — Z951 Presence of aortocoronary bypass graft: Secondary | ICD-10-CM

## 2019-04-27 DIAGNOSIS — Z888 Allergy status to other drugs, medicaments and biological substances status: Secondary | ICD-10-CM

## 2019-04-27 DIAGNOSIS — Z23 Encounter for immunization: Secondary | ICD-10-CM

## 2019-04-27 DIAGNOSIS — Z20828 Contact with and (suspected) exposure to other viral communicable diseases: Secondary | ICD-10-CM | POA: Diagnosis present

## 2019-04-27 DIAGNOSIS — J189 Pneumonia, unspecified organism: Secondary | ICD-10-CM

## 2019-04-27 DIAGNOSIS — F419 Anxiety disorder, unspecified: Secondary | ICD-10-CM | POA: Diagnosis present

## 2019-04-27 DIAGNOSIS — E875 Hyperkalemia: Secondary | ICD-10-CM | POA: Diagnosis present

## 2019-04-27 DIAGNOSIS — N184 Chronic kidney disease, stage 4 (severe): Secondary | ICD-10-CM | POA: Diagnosis present

## 2019-04-27 DIAGNOSIS — Z720 Tobacco use: Secondary | ICD-10-CM | POA: Diagnosis present

## 2019-04-27 DIAGNOSIS — E1151 Type 2 diabetes mellitus with diabetic peripheral angiopathy without gangrene: Secondary | ICD-10-CM | POA: Diagnosis present

## 2019-04-27 DIAGNOSIS — E1165 Type 2 diabetes mellitus with hyperglycemia: Secondary | ICD-10-CM | POA: Diagnosis present

## 2019-04-27 DIAGNOSIS — Z794 Long term (current) use of insulin: Secondary | ICD-10-CM

## 2019-04-27 DIAGNOSIS — I130 Hypertensive heart and chronic kidney disease with heart failure and stage 1 through stage 4 chronic kidney disease, or unspecified chronic kidney disease: Secondary | ICD-10-CM | POA: Diagnosis present

## 2019-04-27 DIAGNOSIS — H409 Unspecified glaucoma: Secondary | ICD-10-CM | POA: Diagnosis present

## 2019-04-27 DIAGNOSIS — I5043 Acute on chronic combined systolic (congestive) and diastolic (congestive) heart failure: Secondary | ICD-10-CM | POA: Diagnosis not present

## 2019-04-27 DIAGNOSIS — Z7982 Long term (current) use of aspirin: Secondary | ICD-10-CM

## 2019-04-27 DIAGNOSIS — N179 Acute kidney failure, unspecified: Secondary | ICD-10-CM | POA: Diagnosis present

## 2019-04-27 DIAGNOSIS — F17210 Nicotine dependence, cigarettes, uncomplicated: Secondary | ICD-10-CM | POA: Diagnosis present

## 2019-04-27 DIAGNOSIS — N2581 Secondary hyperparathyroidism of renal origin: Secondary | ICD-10-CM | POA: Diagnosis present

## 2019-04-27 DIAGNOSIS — Z6841 Body Mass Index (BMI) 40.0 and over, adult: Secondary | ICD-10-CM

## 2019-04-27 DIAGNOSIS — D509 Iron deficiency anemia, unspecified: Secondary | ICD-10-CM

## 2019-04-27 DIAGNOSIS — I255 Ischemic cardiomyopathy: Secondary | ICD-10-CM | POA: Diagnosis present

## 2019-04-27 DIAGNOSIS — Z8249 Family history of ischemic heart disease and other diseases of the circulatory system: Secondary | ICD-10-CM

## 2019-04-27 DIAGNOSIS — M19012 Primary osteoarthritis, left shoulder: Secondary | ICD-10-CM | POA: Diagnosis present

## 2019-04-27 DIAGNOSIS — N186 End stage renal disease: Secondary | ICD-10-CM | POA: Diagnosis present

## 2019-04-27 DIAGNOSIS — Z833 Family history of diabetes mellitus: Secondary | ICD-10-CM

## 2019-04-27 HISTORY — DX: Other specified abnormal findings of blood chemistry: R79.89

## 2019-04-27 HISTORY — DX: Other specified abnormalities of plasma proteins: R77.8

## 2019-04-27 HISTORY — DX: Lobar pneumonia, unspecified organism: J18.1

## 2019-04-27 HISTORY — DX: Sepsis, unspecified organism: A41.9

## 2019-04-27 LAB — COMPREHENSIVE METABOLIC PANEL
ALT: 16 U/L (ref 0–44)
AST: 29 U/L (ref 15–41)
Albumin: 3.6 g/dL (ref 3.5–5.0)
Alkaline Phosphatase: 161 U/L — ABNORMAL HIGH (ref 38–126)
Anion gap: 14 (ref 5–15)
BUN: 38 mg/dL — ABNORMAL HIGH (ref 8–23)
CO2: 15 mmol/L — ABNORMAL LOW (ref 22–32)
Calcium: 9 mg/dL (ref 8.9–10.3)
Chloride: 113 mmol/L — ABNORMAL HIGH (ref 98–111)
Creatinine, Ser: 3.15 mg/dL — ABNORMAL HIGH (ref 0.44–1.00)
GFR calc Af Amer: 17 mL/min — ABNORMAL LOW (ref >60)
GFR calc non Af Amer: 14 mL/min — ABNORMAL LOW (ref >60)
Glucose, Bld: 290 mg/dL — ABNORMAL HIGH (ref 70–99)
Potassium: 5.5 mmol/L — ABNORMAL HIGH (ref 3.5–5.1)
Sodium: 142 mmol/L (ref 135–145)
Total Bilirubin: 0.4 mg/dL (ref 0.3–1.2)
Total Protein: 7.2 g/dL (ref 6.5–8.1)

## 2019-04-27 LAB — CBC WITH DIFFERENTIAL/PLATELET
Abs Immature Granulocytes: 0.17 10*3/uL — ABNORMAL HIGH (ref 0.00–0.07)
Basophils Absolute: 0 10*3/uL (ref 0.0–0.1)
Basophils Relative: 0 %
Eosinophils Absolute: 0.1 10*3/uL (ref 0.0–0.5)
Eosinophils Relative: 1 %
HCT: 38.1 % (ref 36.0–46.0)
Hemoglobin: 11 g/dL — ABNORMAL LOW (ref 12.0–15.0)
Immature Granulocytes: 1 %
Lymphocytes Relative: 3 %
Lymphs Abs: 0.5 10*3/uL — ABNORMAL LOW (ref 0.7–4.0)
MCH: 24.3 pg — ABNORMAL LOW (ref 26.0–34.0)
MCHC: 28.9 g/dL — ABNORMAL LOW (ref 30.0–36.0)
MCV: 84.1 fL (ref 80.0–100.0)
Monocytes Absolute: 1.1 10*3/uL — ABNORMAL HIGH (ref 0.1–1.0)
Monocytes Relative: 6 %
Neutro Abs: 16.8 10*3/uL — ABNORMAL HIGH (ref 1.7–7.7)
Neutrophils Relative %: 89 %
Platelets: 308 10*3/uL (ref 150–400)
RBC: 4.53 MIL/uL (ref 3.87–5.11)
RDW: 18.1 % — ABNORMAL HIGH (ref 11.5–15.5)
WBC: 18.7 10*3/uL — ABNORMAL HIGH (ref 4.0–10.5)
nRBC: 0 % (ref 0.0–0.2)

## 2019-04-27 LAB — SARS CORONAVIRUS 2 BY RT PCR (HOSPITAL ORDER, PERFORMED IN ~~LOC~~ HOSPITAL LAB): SARS Coronavirus 2: NEGATIVE

## 2019-04-27 LAB — FERRITIN: Ferritin: 57 ng/mL (ref 11–307)

## 2019-04-27 LAB — D-DIMER, QUANTITATIVE: D-Dimer, Quant: 4.21 ug/mL-FEU — ABNORMAL HIGH (ref 0.00–0.50)

## 2019-04-27 LAB — LACTIC ACID, PLASMA: Lactic Acid, Venous: 3.1 mmol/L (ref 0.5–1.9)

## 2019-04-27 LAB — FIBRINOGEN: Fibrinogen: 588 mg/dL — ABNORMAL HIGH (ref 210–475)

## 2019-04-27 LAB — PROCALCITONIN: Procalcitonin: 2.7 ng/mL

## 2019-04-27 LAB — LACTATE DEHYDROGENASE: LDH: 197 U/L — ABNORMAL HIGH (ref 98–192)

## 2019-04-27 LAB — BRAIN NATRIURETIC PEPTIDE: B Natriuretic Peptide: 619.8 pg/mL — ABNORMAL HIGH (ref 0.0–100.0)

## 2019-04-27 LAB — TRIGLYCERIDES: Triglycerides: 66 mg/dL (ref <150)

## 2019-04-27 LAB — TROPONIN I: Troponin I: 0.38 ng/mL (ref <0.03)

## 2019-04-27 LAB — C-REACTIVE PROTEIN: CRP: 1 mg/dL — ABNORMAL HIGH (ref <1.0)

## 2019-04-27 MED ORDER — ALLOPURINOL 100 MG PO TABS
100.0000 mg | ORAL_TABLET | Freq: Every day | ORAL | Status: DC
  Administered 2019-04-28 – 2019-04-30 (×3): 100 mg via ORAL
  Filled 2019-04-27 (×3): qty 1

## 2019-04-27 MED ORDER — INSULIN ASPART 100 UNIT/ML IV SOLN
5.0000 [IU] | Freq: Once | INTRAVENOUS | Status: AC
  Administered 2019-04-28: 5 [IU] via INTRAVENOUS

## 2019-04-27 MED ORDER — INSULIN GLARGINE 100 UNIT/ML ~~LOC~~ SOLN
15.0000 [IU] | Freq: Every day | SUBCUTANEOUS | Status: DC
  Administered 2019-04-28 – 2019-04-29 (×2): 15 [IU] via SUBCUTANEOUS
  Filled 2019-04-27 (×7): qty 0.15

## 2019-04-27 MED ORDER — SODIUM CHLORIDE 0.9 % IV SOLN
1.0000 g | Freq: Once | INTRAVENOUS | Status: AC
  Administered 2019-04-27: 1 g via INTRAVENOUS
  Filled 2019-04-27: qty 10

## 2019-04-27 MED ORDER — HYDRALAZINE HCL 20 MG/ML IJ SOLN: 5.0000 mg | INTRAMUSCULAR | Status: DC | PRN

## 2019-04-27 MED ORDER — MOMETASONE FURO-FORMOTEROL FUM 200-5 MCG/ACT IN AERO
2.0000 | INHALATION_SPRAY | Freq: Two times a day (BID) | RESPIRATORY_TRACT | Status: DC
  Administered 2019-04-28 – 2019-04-30 (×5): 2 via RESPIRATORY_TRACT
  Filled 2019-04-27: qty 8.8

## 2019-04-27 MED ORDER — METOPROLOL TARTRATE 25 MG PO TABS
25.0000 mg | ORAL_TABLET | Freq: Two times a day (BID) | ORAL | Status: DC
  Administered 2019-04-28: 25 mg via ORAL
  Filled 2019-04-27 (×2): qty 1

## 2019-04-27 MED ORDER — HEPARIN (PORCINE) 25000 UT/250ML-% IV SOLN
1350.0000 [IU]/h | INTRAVENOUS | Status: DC
  Administered 2019-04-28 – 2019-04-29 (×3): 1400 [IU]/h via INTRAVENOUS
  Filled 2019-04-27 (×3): qty 250

## 2019-04-27 MED ORDER — HYDROCODONE-ACETAMINOPHEN 5-325 MG PO TABS: 1.0000 | ORAL_TABLET | Freq: Three times a day (TID) | ORAL | Status: DC | PRN

## 2019-04-27 MED ORDER — BUPROPION HCL ER (SR) 150 MG PO TB12
150.0000 mg | ORAL_TABLET | Freq: Two times a day (BID) | ORAL | Status: DC
  Administered 2019-04-28 – 2019-04-30 (×5): 150 mg via ORAL
  Filled 2019-04-27 (×5): qty 1

## 2019-04-27 MED ORDER — HEPARIN BOLUS VIA INFUSION
4000.0000 [IU] | Freq: Once | INTRAVENOUS | Status: AC
  Administered 2019-04-28: 4000 [IU] via INTRAVENOUS
  Filled 2019-04-27: qty 4000

## 2019-04-27 MED ORDER — IPRATROPIUM BROMIDE HFA 17 MCG/ACT IN AERS
2.0000 | INHALATION_SPRAY | RESPIRATORY_TRACT | Status: DC
  Administered 2019-04-28 – 2019-04-29 (×6): 2 via RESPIRATORY_TRACT
  Filled 2019-04-27 (×2): qty 12.9

## 2019-04-27 MED ORDER — SODIUM CHLORIDE 0.9 % IV SOLN: 500.0000 mg | INTRAVENOUS | Status: DC

## 2019-04-27 MED ORDER — BRINZOLAMIDE 1 % OP SUSP
1.0000 [drp] | Freq: Three times a day (TID) | OPHTHALMIC | Status: DC
  Administered 2019-04-28 – 2019-04-30 (×7): 1 [drp] via OPHTHALMIC
  Filled 2019-04-27: qty 10

## 2019-04-27 MED ORDER — SODIUM POLYSTYRENE SULFONATE 15 GM/60ML PO SUSP
30.0000 g | Freq: Once | ORAL | Status: AC
  Administered 2019-04-28: 30 g via ORAL
  Filled 2019-04-27: qty 120

## 2019-04-27 MED ORDER — POLYETHYLENE GLYCOL 3350 17 G PO PACK: 17.0000 g | PACK | Freq: Every day | ORAL | Status: DC | PRN

## 2019-04-27 MED ORDER — PRAVASTATIN SODIUM 40 MG PO TABS
40.0000 mg | ORAL_TABLET | Freq: Every day | ORAL | Status: DC
  Administered 2019-04-28: 40 mg via ORAL
  Filled 2019-04-27: qty 1

## 2019-04-27 MED ORDER — LINACLOTIDE 72 MCG PO CAPS
72.0000 ug | ORAL_CAPSULE | Freq: Every day | ORAL | Status: DC
  Administered 2019-04-28 – 2019-04-30 (×3): 72 ug via ORAL
  Filled 2019-04-27 (×3): qty 1

## 2019-04-27 MED ORDER — INSULIN ASPART 100 UNIT/ML ~~LOC~~ SOLN
0.0000 [IU] | Freq: Three times a day (TID) | SUBCUTANEOUS | Status: DC
  Administered 2019-04-28 – 2019-04-29 (×2): 2 [IU] via SUBCUTANEOUS
  Administered 2019-04-29 – 2019-04-30 (×3): 1 [IU] via SUBCUTANEOUS

## 2019-04-27 MED ORDER — NITROGLYCERIN 0.4 MG SL SUBL: 0.4000 mg | SUBLINGUAL_TABLET | SUBLINGUAL | Status: DC | PRN

## 2019-04-27 MED ORDER — GABAPENTIN 300 MG PO CAPS
300.0000 mg | ORAL_CAPSULE | Freq: Three times a day (TID) | ORAL | Status: DC
  Administered 2019-04-28 – 2019-04-29 (×5): 300 mg via ORAL
  Filled 2019-04-27 (×5): qty 1

## 2019-04-27 MED ORDER — SODIUM CHLORIDE 0.9 % IV SOLN
500.0000 mg | Freq: Once | INTRAVENOUS | Status: AC
  Administered 2019-04-27: 500 mg via INTRAVENOUS
  Filled 2019-04-27: qty 500

## 2019-04-27 MED ORDER — LATANOPROST 0.005 % OP SOLN
1.0000 [drp] | Freq: Every day | OPHTHALMIC | Status: DC
  Administered 2019-04-28 – 2019-04-29 (×2): 1 [drp] via OPHTHALMIC
  Filled 2019-04-27: qty 2.5

## 2019-04-27 MED ORDER — ALBUTEROL SULFATE (2.5 MG/3ML) 0.083% IN NEBU: 2.5000 mg | INHALATION_SOLUTION | Freq: Four times a day (QID) | RESPIRATORY_TRACT | Status: DC | PRN

## 2019-04-27 MED ORDER — ACETAMINOPHEN 325 MG PO TABS
650.0000 mg | ORAL_TABLET | Freq: Four times a day (QID) | ORAL | Status: DC | PRN
  Administered 2019-04-29: 650 mg via ORAL
  Filled 2019-04-27: qty 2

## 2019-04-27 MED ORDER — ASPIRIN EC 81 MG PO TBEC
81.0000 mg | DELAYED_RELEASE_TABLET | Freq: Every day | ORAL | Status: DC
  Administered 2019-04-28 – 2019-04-30 (×3): 81 mg via ORAL
  Filled 2019-04-27 (×3): qty 1

## 2019-04-27 MED ORDER — ALBUTEROL SULFATE HFA 108 (90 BASE) MCG/ACT IN AERS: 1.0000 | INHALATION_SPRAY | Freq: Four times a day (QID) | RESPIRATORY_TRACT | Status: DC | PRN

## 2019-04-27 MED ORDER — SODIUM CHLORIDE 0.9 % IV SOLN: 1.0000 g | INTRAVENOUS | Status: DC

## 2019-04-27 MED ORDER — PANTOPRAZOLE SODIUM 40 MG PO TBEC
40.0000 mg | DELAYED_RELEASE_TABLET | Freq: Every day | ORAL | Status: DC
  Administered 2019-04-28 – 2019-04-30 (×3): 40 mg via ORAL
  Filled 2019-04-27 (×3): qty 1

## 2019-04-27 MED ORDER — DM-GUAIFENESIN ER 30-600 MG PO TB12: 1.0000 | ORAL_TABLET | Freq: Two times a day (BID) | ORAL | Status: DC | PRN

## 2019-04-27 MED ORDER — DEXTROSE 50 % IV SOLN
50.0000 mL | Freq: Once | INTRAVENOUS | Status: AC
  Administered 2019-04-28: 50 mL via INTRAVENOUS
  Filled 2019-04-27: qty 50

## 2019-04-27 MED ORDER — LORATADINE 10 MG PO TABS
10.0000 mg | ORAL_TABLET | Freq: Every day | ORAL | Status: DC
  Administered 2019-04-28 – 2019-04-30 (×3): 10 mg via ORAL
  Filled 2019-04-27 (×3): qty 1

## 2019-04-27 MED ORDER — NICOTINE 21 MG/24HR TD PT24
21.0000 mg | MEDICATED_PATCH | Freq: Every day | TRANSDERMAL | Status: DC
  Administered 2019-04-28 – 2019-04-30 (×3): 21 mg via TRANSDERMAL
  Filled 2019-04-27 (×3): qty 1

## 2019-04-27 NOTE — Progress Notes (Signed)
ANTICOAGULATION CONSULT NOTE - Initial Consult  Pharmacy Consult for heparin Indication: chest pain/ACS  Allergies  Allergen Reactions  . Naproxen Rash    Patient Measurements:   Heparin Dosing Weight: last weight 272 lb  Vital Signs: Temp: 97.7 F (36.5 C) (04/29 1947) Temp Source: Oral (04/29 1947) BP: 139/71 (04/29 2200) Pulse Rate: 89 (04/29 2200)  Labs: Recent Labs    04/27/19 1938  HGB 11.0*  HCT 38.1  PLT 308  CREATININE 3.15*  TROPONINI 0.38*    CrCl cannot be calculated (Unknown ideal weight.).   Medical History: Past Medical History:  Diagnosis Date  . Anemia 08/2016  . Angiodysplasia of colon   . Arthritis of left shoulder region 03/23/2013  . Chronic combined systolic and diastolic CHF (congestive heart failure) (HCC)    a. EF 40-45%, mild LVH, mid apicalanteroseptal and apical HK.  . CKD (chronic kidney disease), stage III ([place])   . Cocaine abuse ([place])    crack cocaine heavily until 2008 then sporadic use since then  . Coronary artery disease    a. 06/2012 NSTEMI/CABG x 3 (LIMA->LAD, VG->OM2, VG->LCX);  b. 04/2015 MV: EF<30%, mid ant, apicalanterior, apical infarct;  c. 04/2015 Cath: LM nl, LAD 90p, LCX 25m, OM1 min irregs, RCA mild dzs, LIMA->LAD nl w/ dist LAD dzs, VG->OM2 nl, VG->LCX nl-->Med Rx.  . CVA (cerebral infarction)    a. right internal capsule stroke in 12/2006  . Diabetes mellitus    diagnosed in 2008  . Essential hypertension   . Glaucoma   . Gout   . HFrEF (heart failure with reduced ejection fraction) ([place])   . Hyperlipidemia   . Hyperparathyroidism, secondary renal ([place])   . Left-sided sensory deficit present   . Obesity, morbid ([place])   . PVD (peripheral vascular disease) ([place])    a. 06/2012 ABI's: R - 0.73, L - 0.71.  Marland Kitchen Renal mass, right   . Shortness of breath dyspnea   . Stroke ([place])   . Thyroid nodule    FNA in 1601 showed follicular cells but not definate neoplasm  . Tobacco abuse   . Trichomoniasis     Medications:   See medication history  Assessment: 68 yo lady with elevated troponin to start heparin.  She was not on anticoagulation PTA.  Baseline Hg 11.0, PTLC 308 Goal of Therapy:  Heparin level 0.3-0.7 units/ml Monitor platelets by anticoagulation protocol: Yes   Plan:  Heparin bolus 4000 unit bolus and drip at 1400 units/hr Check heparin level 6-8 hours after start Daily HL and CBC while on heparin Monitor for bleeding complications  [name] [name] 04/27/2019,11:59 PM
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
## 2019-04-27 NOTE — Consult Note (Addendum)
Cardiology Consultation:   Patient ID: [name] [name]; [id number]; 1951-03-10   Admit date: 04/27/2019 Date of Consult: 04/27/2019  Primary Care Provider: [facility] Primary Cardiologist: No primary care provider on file. Primary Electrophysiologist:  None  Chief Complaint: shortness of breath  Patient Profile:   [name] [name] is a 68 y.o. female with a hx of CKD (Cr 3.0), mild HF LV EF 40-45%, active cocaine use, CAD s/p CABG 06/2012 who presents with dyspnea.  Cardiology is consulted for elevated troponin  History of Present Illness:   The patient was in her normal state of health until this afternoon when she developed dyspnea after getting into an argument with her husband.  She came to the ED for further evaluation of this dyspnea.  In the ED, initial VS were HR 125, BP 138/68, O2 sat 88%, RR 22. CXR notable for some reticular opacities, possibly atypical PNA.  Labs notable for WBC 18.7, Cr 3.1, K 5.5, BNP 619, Tropinin I 0.38 (last checked 2017 was 0.06).  ECG with sinus tachycardia and lateral TWI (I and aVL).  Pt denies any chest pain today.  She has chronic LE edema which is maybe a little worse.  She did use crack cocaine at around 3pm.   Past Medical History:  Diagnosis Date  . Anemia 08/2016  . Angiodysplasia of colon   . Arthritis of left shoulder region 03/23/2013  . Chronic combined systolic and diastolic CHF (congestive heart failure) (HCC)    a. EF 40-45%, mild LVH, mid apicalanteroseptal and apical HK.  . CKD (chronic kidney disease), stage III ([place])   . Cocaine abuse ([place])    crack cocaine heavily until 2008 then sporadic use since then  . Coronary artery disease    a. 06/2012 NSTEMI/CABG x 3 (LIMA->LAD, VG->OM2, VG->LCX);  b. 04/2015 MV: EF<30%, mid ant, apicalanterior, apical infarct;  c. 04/2015 Cath: LM nl, LAD 90p, LCX 99m, OM1 min irregs, RCA mild dzs, LIMA->LAD nl w/ dist LAD dzs, VG->OM2 nl, VG->LCX nl-->Med Rx.  . CVA (cerebral  infarction)    a. right internal capsule stroke in 12/2006  . Diabetes mellitus    diagnosed in 2008  . Essential hypertension   . Glaucoma   . Gout   . HFrEF (heart failure with reduced ejection fraction) ([place])   . Hyperlipidemia   . Hyperparathyroidism, secondary renal ([place])   . Left-sided sensory deficit present   . Obesity, morbid ([place])   . PVD (peripheral vascular disease) ([place])    a. 06/2012 ABI's: R - 0.73, L - 0.71.  Marland Kitchen Renal mass, right   . Shortness of breath dyspnea   . Stroke ([place])   . Thyroid nodule    FNA in 8295 showed follicular cells but not definate neoplasm  . Tobacco abuse   . Trichomoniasis     Past Surgical History:  Procedure Laterality Date  . CARDIAC CATHETERIZATION    . CARDIAC CATHETERIZATION N/A 05/17/2015   Procedure: Left Heart Cath and Cors/Grafts Angiography;  Surgeon: [name], MD;  Location: [place] CV LAB;  Service: Cardiovascular;  Laterality: N/A;  . COLONOSCOPY WITH PROPOFOL N/A 04/21/2016   Procedure: COLONOSCOPY WITH PROPOFOL;  Surgeon: Irene Shipper, MD;  Location: [place];  Service: Endoscopy;  Laterality: N/A;  . CORONARY ARTERY BYPASS GRAFT  07/09/2012   Procedure: CORONARY ARTERY BYPASS GRAFTING (CABG);  Surgeon: Ivin Poot, MD;  Location: [place];  Service: Open Heart Surgery;  Laterality: N/A;  .  ESOPHAGOGASTRODUODENOSCOPY N/A 04/20/2016   Procedure: ESOPHAGOGASTRODUODENOSCOPY (EGD);  Surgeon: Gatha Mayer, MD;  Location: [facility] ENDOSCOPY;  Service: Endoscopy;  Laterality: N/A;  . LEFT HEART CATHETERIZATION WITH CORONARY ANGIOGRAM N/A 06/29/2012   Procedure: LEFT HEART CATHETERIZATION WITH CORONARY ANGIOGRAM;  Surgeon: Peter M Martinique, MD;  Location: [facility] CATH LAB;  Service: Cardiovascular;  Laterality: N/A;  . STERNAL WOUND DEBRIDEMENT  08/17/2012   Procedure: STERNAL WOUND DEBRIDEMENT;  Surgeon: Ivin Poot, MD;  Location: [facility] OR;  Service: Thoracic;  Laterality: N/A;  wound vac application  . STERNAL WOUND DEBRIDEMENT   08/24/2012   Procedure: STERNAL WOUND DEBRIDEMENT;  Surgeon: Ivin Poot, MD;  Location: [place];  Service: Thoracic;  Laterality: N/A;  . STERNAL WOUND DEBRIDEMENT  09/01/2012   Procedure: STERNAL WOUND DEBRIDEMENT;  Surgeon: Ivin Poot, MD;  Location: [place];  Service: Thoracic;  Laterality: N/A;  . STERNAL WOUND DEBRIDEMENT  09/20/2012   Procedure: STERNAL WOUND DEBRIDEMENT;  Surgeon: Ivin Poot, MD;  Location: [facility] OR;  Service: Thoracic;  Laterality: N/A;  wound vac change     Inpatient Medications: Scheduled Meds: . loratadine  10 mg Oral Daily   Continuous Infusions: . azithromycin 500 mg (04/27/19 2207)   PRN Meds:   Home Meds: Prior to Admission medications   Medication Sig Start Date End Date Taking? Authorizing Provider  allopurinol (ZYLOPRIM) 100 MG tablet Take 100 mg by mouth daily. 11/25/17  Yes [provider]  aspirin 81 MG tablet Take 81 mg by mouth daily.    Yes [provider]  brinzolamide (AZOPT) 1 % ophthalmic suspension Place 1 drop into both eyes 3 (three) times daily.   Yes [provider]  buPROPion (WELLBUTRIN SR) 150 MG 12 hr tablet Take 1 tablet (150 mg total) by mouth 2 (two) times daily. 01/16/16  Yes Mikhail, Velta Addison, DO  cetirizine (ZYRTEC) 10 MG tablet Take 10 mg by mouth daily as needed for allergies.    Yes [provider]  ferrous sulfate 325 (65 FE) MG tablet Take 1 tablet (325 mg total) by mouth 2 (two) times daily with a meal. Patient taking differently: Take 325 mg by mouth 3 (three) times daily with meals.  04/21/16  Yes Bonnielee Haff, MD  Fluticasone-Salmeterol (ADVAIR) 250-50 MCG/DOSE AEPB Inhale 1 puff into the lungs every 12 (twelve) hours.   Yes [provider]  furosemide (LASIX) 40 MG tablet Take 1 tablet (40 mg total) by mouth daily. Take additional dose if you experience weight gain. Patient taking differently: Take 40-80 mg by mouth See admin instructions. Take 2 tablets in the morning  and 1 tablet at bedtime 05/17/15  Yes Mikhail, [place], DO  gabapentin (NEURONTIN) 300 MG capsule Take 300 mg by mouth 3 (three) times daily.    Yes [provider]  glimepiride (AMARYL) 4 MG tablet Take 4 mg by mouth daily. 04/07/16  Yes [provider]  Insulin Glargine (LANTUS SOLOSTAR) 100 UNIT/ML Solostar Pen Inject 25 Units into the skin daily at 10 pm. Patient taking differently: Inject 21 Units into the skin daily at 10 pm.  01/16/16  Yes Mikhail, Velta Addison, DO  latanoprost (XALATAN) 0.005 % ophthalmic solution Place 1 drop into both eyes at bedtime.   Yes [provider]  LINZESS 72 MCG capsule Take 72 mcg by mouth daily before breakfast.  11/21/17  Yes [provider]  metolazone (ZAROXOLYN) 5 MG tablet Take 5 mg by mouth daily. 11/06/17  Yes [provider]  metoprolol  tartrate (LOPRESSOR) 25 MG tablet Take 0.5 tablets (12.5 mg total) by mouth 2 (two) times daily. Patient taking differently: Take 25 mg by mouth 2 (two) times daily.  01/16/16  Yes Mikhail, Maryann, DO  pantoprazole (PROTONIX) 40 MG tablet Take 1 tablet (40 mg total) by mouth daily. 09/23/16  Yes Thurnell Lose, MD  polyethylene glycol (MIRALAX / GLYCOLAX) packet Take 17 g by mouth daily. 01/26/18  Yes Horton, Barbette Hair, MD  pravastatin (PRAVACHOL) 40 MG tablet Take 40 mg by mouth at bedtime.   Yes [provider]  PROAIR HFA 108 (90 Base) MCG/ACT inhaler Inhale 1-2 puffs into the lungs every 6 (six) hours as needed for wheezing.  11/17/17  Yes [provider]  traMADol (ULTRAM) 50 MG tablet Take 50 mg by mouth 3 (three) times daily.    Yes [provider]  HYDROcodone-acetaminophen (NORCO/VICODIN) 5-325 MG tablet Take 1 tablet by mouth every 8 (eight) hours as needed. Patient not taking: Reported on 01/26/2018 12/23/17   Varney Biles, MD  predniSONE (DELTASONE) 10 MG tablet Take 6 tablets (60 mg total) by mouth daily. Patient not taking: Reported on  01/26/2018 12/23/17   Varney Biles, MD    Allergies:    Allergies  Allergen Reactions  . Naproxen Rash    Social History:   Social History   Socioeconomic History  . Marital status: Single    Spouse name: Not on file  . Number of children: Not on file  . Years of education: Not on file  . Highest education level: Not on file  Occupational History  . Not on file  Social Needs  . Financial resource strain: Not on file  . Food insecurity:    Worry: Not on file    Inability: Not on file  . Transportation needs:    Medical: Not on file    Non-medical: Not on file  Tobacco Use  . Smoking status: Current Some Day Smoker    Packs/day: 0.25    Years: 50.00    Pack years: 12.50  . Smokeless tobacco: Never Used  Substance and Sexual Activity  . Alcohol use: No    Alcohol/week: 0.0 standard drinks  . Drug use: Not Currently    Types: Cocaine    Comment: pt denies at current time  . Sexual activity: Never  Lifestyle  . Physical activity:    Days per week: Not on file    Minutes per session: Not on file  . Stress: Not on file  Relationships  . Social connections:    Talks on phone: Not on file    Gets together: Not on file    Attends religious service: Not on file    Active member of club or organization: Not on file    Attends meetings of clubs or organizations: Not on file    Relationship status: Not on file  . Intimate partner violence:    Fear of current or ex partner: Not on file    Emotionally abused: Not on file    Physically abused: Not on file    Forced sexual activity: Not on file  Other Topics Concern  . Not on file  Social History Narrative   Lives with boyfriend of 20 years.     Family History:   The patient's family history includes Cancer in her mother; Cerebrovascular Accident in her father; Diabetes in her mother; Gout in her father; Hyperlipidemia in her father; Hypertension in her father and mother; Kidney disease in  her father; Other in an  other family member.  ROS:  Please see the history of present illness.  All other ROS reviewed and negative.     Physical Exam/Data:   Vitals:   04/27/19 2030 04/27/19 2100 04/27/19 2130 04/27/19 2200  BP: 137/69 133/66 (!) 140/58 139/71  Pulse: (!) 102 97 92 89  Resp: (!) 21 20 (!) 22 (!) 22  Temp:      TempSrc:      SpO2: 95% 97% 100% 100%   No intake or output data in the 24 hours ending 04/27/19 2233 Last 3 Weights 08/12/2018 12/23/2017 09/23/2016  Weight (lbs) 272 lb 279 lb 298 lb  Weight (kg) 123.378 kg 126.554 kg 135.172 kg    There is no height or weight on file to calculate BMI.   EXAM Patient lying in bed in no distress Full exam not performed to risk of COVID-19  EKG:  See HPI  Relevant CV Studies: 5/16 LHC Left Main  The vessel is angiographically normal.  Left Anterior Descending  Prox LAD lesion 90% stenosed  eccentric .  Left Circumflex  Mid Cx lesion 50% stenosed  Mid Cx lesion.  First Obtuse Marginal Branch  There is mild.  Right Coronary Artery  There is mildthe vessel.  Free LIMA Graft to Prox LAD  LIMA is normal in caliber, and is anatomically normal. There is mild to moderate LAD stenosis just beyond the LIMA insertion site. The LIMA graft is large in caliber and it inserts onto a small diffusely diseased LAD.  single graft Graft to 2nd Mrg  SVG was injected is normal in caliber. There is mild diffuse disease in the graft. There is diffuse disease in the second obtuse marginal beyond the graft insertion site.  Graft to Dist Cx  was injected is normal in caliber. There is mild diffuse disease in the graft.     4/17 Echo - Left ventricle: The cavity size was mildly dilated. Systolic   function was mildly to moderately reduced. The estimated ejection   fraction was in the range of 40% to 45%. There is severe   hypokinesis of the entireinferoseptal myocardium. There is   akinesis of the basal-midinferior myocardium. There is akinesis   of the  apicalanterior myocardium. There is hypokinesis of the   midanterior myocardium. Features are consistent with a   pseudonormal left ventricular filling pattern, with concomitant   abnormal relaxation and increased filling pressure (grade 2   diastolic dysfunction). Doppler parameters are consistent with   high ventricular filling pressure. - Mitral valve: Calcified annulus. - Left atrium: The atrium was mildly dilated. - Atrial septum: There was increased thickness of the septum,   consistent with lipomatous hypertrophy.  Laboratory Data:  Chemistry Recent Labs  Lab 04/27/19 1938  NA 142  K 5.5*  CL 113*  CO2 15*  GLUCOSE 290*  BUN 38*  CREATININE 3.15*  CALCIUM 9.0  GFRNONAA 14*  GFRAA 17*  ANIONGAP 14    Recent Labs  Lab 04/27/19 1938  PROT 7.2  ALBUMIN 3.6  AST 29  ALT 16  ALKPHOS 161*  BILITOT 0.4   Hematology Recent Labs  Lab 04/27/19 1938  WBC 18.7*  RBC 4.53  HGB 11.0*  HCT 38.1  MCV 84.1  MCH 24.3*  MCHC 28.9*  RDW 18.1*  PLT 308   Cardiac Enzymes Recent Labs  Lab 04/27/19 1938  TROPONINI 0.38*   No results for input(s): TROPIPOC in the last 168 hours.  BNP  Recent Labs  Lab 04/27/19 1942  BNP 619.8*    DDimer  Recent Labs  Lab 04/27/19 2126  DDIMER 4.21*    Radiology/Studies:  Dg Chest Port 1 View  Result Date: 04/27/2019 CLINICAL DATA:  68 year old female with a history shortness of breath EXAM: PORTABLE CHEST 1 VIEW COMPARISON:  09/18/2016 FINDINGS: Cardiomediastinal silhouette unchanged in size and contour. Surgical changes of median sternotomy and CABG. Coarsened interstitial markings throughout, with reticular pattern of opacity more pronounced than the prior. No pneumothorax or pleural effusion. No confluent airspace disease. IMPRESSION: Reticular pattern of opacity, potentially atypical pneumonia, bronchitis, edema, and/or developing interstitial fibrosis. Electronically Signed   By: Corrie Mckusick D.O.   On: 04/27/2019 19:54     Assessment and Plan:   1. Elevated troponin This is likely either demand ischmia, or mild chronic troponin elevation in the setting of CKD (last troponin check was 2017, so no recent baseline).  Low suspicion for type I MI.  She has no CP, and ECG without ST segment changes, though some TWI.  She is currently almost back to baseline regarding her dyspnea.  Suspect this is due to pneumonia and/or mild HF in the setting of cocaine use and stress/anxiety.  She is being admitted to medicine and tested for COVID-19.  Would recheck troponin I at 4 hours.  If significant increase, would give IV heparin and treat for NSTEMI.  If unchanged, would just observe.  Would get echocardiogram tomorrow to evaluate LV function.  Her BNP is elevated, which may indicate worsening HF.  -- repeat troponin in 4 hours -- if significant increase in troponin, would start IV heparin -- would get echo tomorrow   For questions or updates, please contact [place] Please consult [email or web address] for contact info under Cardiology/STEMI.    Liliane Bade, MD  04/27/2019 10:33 PM

## 2019-04-27 NOTE — ED Notes (Signed)
Pt sleepiong  No distress

## 2019-04-27 NOTE — ED Notes (Signed)
No pain just upset but feeling better

## 2019-04-27 NOTE — ED Provider Notes (Signed)
[facility] EMERGENCY DEPARTMENT Provider Note   CSN: [id number] Arrival date & time: 04/27/19  1823    History   Chief Complaint Chief Complaint  Patient presents with   Shortness of Breath   Anxiety    HPI [name] [name] is a 68 y.o. female with a PMH of CHF s/p CABG, cocaine use, COPD, Diabetes, NSTEMI, and CKD presenting with constant shortness of breath onset today at 4:30pm after having a disagreement with husband. Patient reports she had a lot of anxiety and started her shortness of breath. Patient denies using oxygen at home. Patient states she has had a dry cough, congestion, and rhinorrhea for 2-3 days. Patient denies sick contacts, recent travel, or COVID-19 exposures. Patient reports tobacco use. Patient states she used crack cocaine at 3pm today. Patient denies any other drug use. Patient denies abdominal pain, nausea, vomiting, or diarrhea. Patient reports chronic leg edema, but denies any worsening edema or pain. Patient denies a history of blood clots. Patient denies fever, chills, chest pain, or palpitations. Last echo was in 2017 and it reveals EF of 40-45%. Last cath was in 2016 and it reveals severe 2 vessel CAD in the LAD and LCX. Cardiologist is Dr. Martinique from [facility].     HPI  Past Medical History:  Diagnosis Date   Anemia 08/2016   Angiodysplasia of colon    Arthritis of left shoulder region 03/23/2013   Chronic combined systolic and diastolic CHF (congestive heart failure) (HCC)    a. EF 40-45%, mild LVH, mid apicalanteroseptal and apical HK.   CKD (chronic kidney disease), stage III ([place])    Cocaine abuse ([place])    crack cocaine heavily until 2008 then sporadic use since then   Coronary artery disease    a. 06/2012 NSTEMI/CABG x 3 (LIMA->LAD, VG->OM2, VG->LCX);  b. 04/2015 MV: EF<30%, mid ant, apicalanterior, apical infarct;  c. 04/2015 Cath: LM nl, LAD 90p, LCX 29m, OM1 min irregs, RCA mild dzs, LIMA->LAD nl w/ dist LAD dzs,  VG->OM2 nl, VG->LCX nl-->Med Rx.   CVA (cerebral infarction)    a. right internal capsule stroke in 12/2006   Diabetes mellitus    diagnosed in 2008   Essential hypertension    Glaucoma    Gout    HFrEF (heart failure with reduced ejection fraction) (HCC)    Hyperlipidemia    Hyperparathyroidism, secondary renal (HCC)    Left-sided sensory deficit present    Obesity, morbid ([place])    PVD (peripheral vascular disease) ([place])    a. 06/2012 ABI's: R - 0.73, L - 0.71.   Renal mass, right    Shortness of breath dyspnea    Stroke [facility])    Thyroid nodule    FNA in 0865 showed follicular cells but not definate neoplasm   Tobacco abuse    Trichomoniasis     Patient Active Problem List   Diagnosis Date Noted   Fever    Symptomatic anemia 09/16/2016   Diabetes mellitus with complication ([place]) 78/46/9629   Obesity 04/29/2016   Hiatal hernia    Heme + stool    Absolute anemia    Bleeding gastrointestinal    Insulin dependent diabetes mellitus (HCC)    Pain in the chest    Demand ischemia [facility])    Iron deficiency anemia    Precordial pain    Chest pain 04/17/2016   Thrombocytosis ([place]) 04/17/2016   Microcytic anemia 02/05/2016   Abscess    Acute systolic heart failure (  [place])    Acute on chronic renal failure (HCC)    CAD in native artery    Substance abuse ([place])    Chronic combined systolic and diastolic CHF (congestive heart failure) (HCC)    CKD (chronic kidney disease) stage 4, GFR 15-29 ml/min (HCC)    Demand ischemia of myocardium - related to Acue HF exacerbation 05/14/2015   Morbid obesity ([place])    Acute on chronic combined systolic and diastolic congestive heart failure ([place]) 05/12/2015   Acute respiratory failure ([place]) 10/18/2014   Type 2 diabetes mellitus with renal manifestations, controlled ([place]) 10/18/2014   S/P CABG (coronary artery bypass graft) 09/21/2013   Arthritis of right shoulder region 03/23/2013   Tobacco  abuse    Coronary artery disease    NSTEMI (non-ST elevated myocardial infarction) ([place]) 06/29/2012   History of cocaine abuse ([place]) 06/29/2012   Acute kidney injury ([place]) 06/29/2012   Essential hypertension 06/29/2012   RBC microcytosis 06/29/2012   Glaucoma    Hyperlipidemia    History of CVA (cerebrovascular accident)     Past Surgical History:  Procedure Laterality Date   CARDIAC CATHETERIZATION     CARDIAC CATHETERIZATION N/A 05/17/2015   Procedure: Left Heart Cath and Cors/Grafts Angiography;  Surgeon: [name], MD;  Location: [facility] CV LAB;  Service: Cardiovascular;  Laterality: N/A;   COLONOSCOPY WITH PROPOFOL N/A 04/21/2016   Procedure: COLONOSCOPY WITH PROPOFOL;  Surgeon: Irene Shipper, MD;  Location: [facility];  Service: Endoscopy;  Laterality: N/A;   CORONARY ARTERY BYPASS GRAFT  07/09/2012   Procedure: CORONARY ARTERY BYPASS GRAFTING (CABG);  Surgeon: Ivin Poot, MD;  Location: [facility];  Service: Open Heart Surgery;  Laterality: N/A;   ESOPHAGOGASTRODUODENOSCOPY N/A 04/20/2016   Procedure: ESOPHAGOGASTRODUODENOSCOPY (EGD);  Surgeon: Gatha Mayer, MD;  Location: [facility] ENDOSCOPY;  Service: Endoscopy;  Laterality: N/A;   LEFT HEART CATHETERIZATION WITH CORONARY ANGIOGRAM N/A 06/29/2012   Procedure: LEFT HEART CATHETERIZATION WITH CORONARY ANGIOGRAM;  Surgeon: Peter M Martinique, MD;  Location: [facility] CATH LAB;  Service: Cardiovascular;  Laterality: N/A;   STERNAL WOUND DEBRIDEMENT  08/17/2012   Procedure: STERNAL WOUND DEBRIDEMENT;  Surgeon: Ivin Poot, MD;  Location: [facility];  Service: Thoracic;  Laterality: N/A;  wound vac application   STERNAL WOUND DEBRIDEMENT  08/24/2012   Procedure: STERNAL WOUND DEBRIDEMENT;  Surgeon: Ivin Poot, MD;  Location: [facility];  Service: Thoracic;  Laterality: N/A;   STERNAL WOUND DEBRIDEMENT  09/01/2012   Procedure: STERNAL WOUND DEBRIDEMENT;  Surgeon: Ivin Poot, MD;  Location: [facility];  Service: Thoracic;  Laterality:  N/A;   STERNAL WOUND DEBRIDEMENT  09/20/2012   Procedure: STERNAL WOUND DEBRIDEMENT;  Surgeon: Ivin Poot, MD;  Location[facility] OR;  Service: Thoracic;  Laterality: N/A;  wound vac change     OB History   No obstetric history on file.      Home Medications    Prior to Admission medications   Medication Sig Start Date End Date Taking? Authorizing Provider  allopurinol (ZYLOPRIM) 100 MG tablet Take 100 mg by mouth daily. 11/25/17  Yes [provider]  aspirin 81 MG tablet Take 81 mg by mouth daily.    Yes [provider]  brinzolamide (AZOPT) 1 % ophthalmic suspension Place 1 drop into both eyes 3 (three) times daily.   Yes [provider]  buPROPion (WELLBUTRIN SR) 150 MG 12 hr tablet Take 1 tablet (150 mg total) by mouth 2 (two) times daily. 01/16/16  Yes  Mikhail, Velta Addison, DO  cetirizine (ZYRTEC) 10 MG tablet Take 10 mg by mouth daily as needed for allergies.    Yes [provider]  ferrous sulfate 325 (65 FE) MG tablet Take 1 tablet (325 mg total) by mouth 2 (two) times daily with a meal. Patient taking differently: Take 325 mg by mouth 3 (three) times daily with meals.  04/21/16  Yes Bonnielee Haff, MD  Fluticasone-Salmeterol (ADVAIR) 250-50 MCG/DOSE AEPB Inhale 1 puff into the lungs every 12 (twelve) hours.   Yes [provider]  furosemide (LASIX) 40 MG tablet Take 1 tablet (40 mg total) by mouth daily. Take additional dose if you experience weight gain. Patient taking differently: Take 40-80 mg by mouth See admin instructions. Take 2 tablets in the morning and 1 tablet at bedtime 05/17/15  Yes Mikhail, [place], DO  gabapentin (NEURONTIN) 300 MG capsule Take 300 mg by mouth 3 (three) times daily.    Yes [provider]  glimepiride (AMARYL) 4 MG tablet Take 4 mg by mouth daily. 04/07/16  Yes [provider]  Insulin Glargine (LANTUS SOLOSTAR) 100 UNIT/ML Solostar Pen Inject 25 Units into the skin daily at 10 pm. Patient  taking differently: Inject 21 Units into the skin daily at 10 pm.  01/16/16  Yes Mikhail, Velta Addison, DO  latanoprost (XALATAN) 0.005 % ophthalmic solution Place 1 drop into both eyes at bedtime.   Yes [provider]  LINZESS 72 MCG capsule Take 72 mcg by mouth daily before breakfast.  11/21/17  Yes [provider]  metolazone (ZAROXOLYN) 5 MG tablet Take 5 mg by mouth daily. 11/06/17  Yes [provider]  metoprolol tartrate (LOPRESSOR) 25 MG tablet Take 0.5 tablets (12.5 mg total) by mouth 2 (two) times daily. Patient taking differently: Take 25 mg by mouth 2 (two) times daily.  01/16/16  Yes Mikhail, Maryann, DO  pantoprazole (PROTONIX) 40 MG tablet Take 1 tablet (40 mg total) by mouth daily. 09/23/16  Yes Thurnell Lose, MD  polyethylene glycol (MIRALAX / GLYCOLAX) packet Take 17 g by mouth daily. 01/26/18  Yes Horton, Barbette Hair, MD  pravastatin (PRAVACHOL) 40 MG tablet Take 40 mg by mouth at bedtime.   Yes [provider]  PROAIR HFA 108 (90 Base) MCG/ACT inhaler Inhale 1-2 puffs into the lungs every 6 (six) hours as needed for wheezing.  11/17/17  Yes [provider]  traMADol (ULTRAM) 50 MG tablet Take 50 mg by mouth 3 (three) times daily.    Yes [provider]  HYDROcodone-acetaminophen (NORCO/VICODIN) 5-325 MG tablet Take 1 tablet by mouth every 8 (eight) hours as needed. Patient not taking: Reported on 01/26/2018 12/23/17   Varney Biles, MD  predniSONE (DELTASONE) 10 MG tablet Take 6 tablets (60 mg total) by mouth daily. Patient not taking: Reported on 01/26/2018 12/23/17   Varney Biles, MD    Family History Family History  Problem Relation Age of Onset   Diabetes Mother    Hypertension Mother    Cancer Mother    Hyperlipidemia Father    Hypertension Father    Kidney disease Father    Gout Father    Cerebrovascular Accident Father    Other Other        no known family CAD    Social History Social History    Tobacco Use   Smoking status: Current Some Day Smoker    Packs/day: 0.25    Years: 50.00    Pack years: 12.50   Smokeless tobacco: Never Used  Substance Use Topics   Alcohol use: No    Alcohol/week: 0.0 standard drinks   Drug use: Not Currently    Types: Cocaine    Comment: pt denies at current time     Allergies   Naproxen   Review of Systems Review of Systems  Constitutional: Negative for chills, diaphoresis, fatigue, fever and unexpected weight change.  HENT: Positive for congestion and rhinorrhea. Negative for sore throat and trouble swallowing.   Eyes: Negative for visual disturbance.  Respiratory: Positive for cough and shortness of breath. Negative for chest tightness, wheezing and stridor.   Cardiovascular: Positive for leg swelling. Negative for chest pain and palpitations.  Gastrointestinal: Negative for abdominal pain, blood in stool, diarrhea, nausea and vomiting.  Endocrine: Negative for cold intolerance and heat intolerance.  Genitourinary: Negative for dysuria.  Musculoskeletal: Negative for gait problem.  Skin: Negative for pallor and rash.  Allergic/Immunologic: Negative for environmental allergies and food allergies.  Neurological: Negative for dizziness, syncope, speech difficulty, weakness, light-headedness and numbness.  Psychiatric/Behavioral: The patient is nervous/anxious.    Physical Exam Updated Vital Signs BP 139/71    Pulse 89    Temp 97.7 F (36.5 C) (Oral)    Resp (!) 22    SpO2 100%   Physical Exam Vitals signs and nursing note reviewed.  Constitutional:      General: She is not in acute distress.    Appearance: She is well-developed. She is not diaphoretic.  HENT:     Head: Normocephalic and atraumatic.     Mouth/Throat:     Pharynx: Oropharynx is clear. No pharyngeal swelling or oropharyngeal exudate.  Neck:     Musculoskeletal: Normal range of motion.  Cardiovascular:     Rate and Rhythm: Normal rate and regular rhythm.      Heart sounds: Normal heart sounds. No murmur. No friction rub. No gallop.   Pulmonary:     Effort: Pulmonary effort is normal. No respiratory distress.     Breath sounds: Examination of the right-middle field reveals rhonchi. Examination of the left-middle field reveals rhonchi. Examination of the right-lower field reveals rhonchi. Examination of the left-lower field reveals rhonchi. Rhonchi present. No decreased breath sounds, wheezing or rales.     Comments: Patient is speaking in full sentences without difficulty. Patient is on 2L of oxygen on room air with oxygen saturation at 92%.  Abdominal:     Palpations: Abdomen is soft.     Tenderness: There is no abdominal tenderness. There is no rebound.  Musculoskeletal: Normal range of motion.     Right lower leg: She exhibits no tenderness. Edema present.     Left lower leg: She exhibits no tenderness. Edema present.  Skin:    General: Skin is warm.     Findings: No erythema or rash.  Neurological:     Mental Status: She is alert.    ED Treatments / Results  Labs (all labs ordered are listed, but only abnormal results are displayed) Labs Reviewed  COMPREHENSIVE METABOLIC PANEL - Abnormal; Notable for the following components:      Result Value   Potassium 5.5 (*)    Chloride 113 (*)    CO2 15 (*)    Glucose, Bld 290 (*)    BUN 38 (*)    Creatinine, Ser 3.15 (*)    Alkaline Phosphatase 161 (*)    GFR calc non Af Amer 14 (*)    GFR calc Af Amer 17 (*)    All other components  within normal limits  CBC WITH DIFFERENTIAL/PLATELET - Abnormal; Notable for the following components:   WBC 18.7 (*)    Hemoglobin 11.0 (*)    MCH 24.3 (*)    MCHC 28.9 (*)    RDW 18.1 (*)    Neutro Abs 16.8 (*)    Lymphs Abs 0.5 (*)    Monocytes Absolute 1.1 (*)    Abs Immature Granulocytes 0.17 (*)    All other components within normal limits  TROPONIN I - Abnormal; Notable for the following components:   Troponin I 0.38 (*)    All other components  within normal limits  BRAIN NATRIURETIC PEPTIDE - Abnormal; Notable for the following components:   B Natriuretic Peptide 619.8 (*)    All other components within normal limits  LACTIC ACID, PLASMA - Abnormal; Notable for the following components:   Lactic Acid, Venous 3.1 (*)    All other components within normal limits  D-DIMER, QUANTITATIVE (NOT AT [facility]) - Abnormal; Notable for the following components:   D-Dimer, Quant 4.21 (*)    All other components within normal limits  LACTATE DEHYDROGENASE - Abnormal; Notable for the following components:   LDH 197 (*)    All other components within normal limits  FIBRINOGEN - Abnormal; Notable for the following components:   Fibrinogen 588 (*)    All other components within normal limits  C-REACTIVE PROTEIN - Abnormal; Notable for the following components:   CRP 1.0 (*)    All other components within normal limits  SARS CORONAVIRUS 2 (HOSPITAL ORDER, [place] [facility])  CULTURE, BLOOD (ROUTINE X 2)  CULTURE, BLOOD (ROUTINE X 2)  FERRITIN  TRIGLYCERIDES  HIV ANTIBODY (ROUTINE TESTING W REFLEX)  LACTIC ACID, PLASMA  PROCALCITONIN  TROPONIN I    EKG EKG Interpretation  Date/Time:  Wednesday April 27 2019 18:32:00 EDT Ventricular Rate:  124 PR Interval:    QRS Duration: 105 QT Interval:  405 QTC Calculation: 582 R Axis:   -9 Text Interpretation:  Sinus or ectopic atrial tachycardia LVH with secondary repolarization abnormality Anterior Q waves, possibly due to LVH Prolonged QT interval agree. similar to previous but inreased amplitudes appear rate related. Confirmed by Charlesetta Shanks [phone]) on 04/27/2019 6:54:50 PM   Radiology Dg Chest Port 1 View  Result Date: 04/27/2019 CLINICAL DATA:  68 year old female with a history shortness of breath EXAM: PORTABLE CHEST 1 VIEW COMPARISON:  09/18/2016 FINDINGS: Cardiomediastinal silhouette unchanged in size and contour. Surgical changes of median sternotomy and CABG.  Coarsened interstitial markings throughout, with reticular pattern of opacity more pronounced than the prior. No pneumothorax or pleural effusion. No confluent airspace disease. IMPRESSION: Reticular pattern of opacity, potentially atypical pneumonia, bronchitis, edema, and/or developing interstitial fibrosis. Electronically Signed   By: Corrie Mckusick D.O.   On: 04/27/2019 19:54    Procedures Procedures (including critical care time)  Medications Ordered in ED Medications  azithromycin (ZITHROMAX) 500 mg in sodium chloride 0.9 % 250 mL IVPB (500 mg Intravenous New Bag/Given 04/27/19 2207)  cefTRIAXone (ROCEPHIN) 1 g in sodium chloride 0.9 % 100 mL IVPB (0 g Intravenous Stopped 04/27/19 2203)     Initial Impression / Assessment and Plan / ED Course  I have reviewed the triage vital signs and the nursing notes.  Pertinent labs & imaging results that were available during my care of the patient were reviewed by me and considered in my medical decision making (see chart for details).  Clinical Course as of Apr 26 2300  Wed Apr 27, 2019  1952 Leukocytosis noted at 18.7.   WBC(!): 18.7 [AH]  2000 CXR reveals reticular pattern of opacity, potentially atypical pneumonia, bronchitis, edema, and/or developing interstitial fibrosis.    DG Chest Port 1 View [AH]  2030 Troponin elevated at 0.38.   Troponin I(!!): 0.38 [AH]  2044 BNP elevated at 619.8.  B Natriuretic Peptide(!): 619.8 [AH]    Clinical Course User Index [AH] Arville Lime, PA-C       Patient presents with shortness of breath. Patient has required 2L of oxygen while in the ER. Patient does not use any oxygen at home. Troponin is 0.38. Consulted cardiology. Cardiology states they will follow patient and recommend admission. Cardiology recommends rechecking troponin I at 4 hours and if significant increase, recommends IV heparin and treatment for NSTEMI. CXR reveals reticular pattern opacity, potentially atypical pneumonia,  bronchitis, edema, and/or developing interstitial fibrosis. Started IV antibiotics for community acquired pneumonia. Concern for COVID-19 due to these findings and patient's symptoms. Will order COVID labs. Patient will require admission due to elevated troponin and community acquired pneumonia with new oxygen requirement. COVID-19 test is negative. Consulted hospitalist for admission. Hospitalist has agreed to admit patient.   Findings and plan of care discussed with supervising physician Dr. Johnney Killian.  [name] [name] was evaluated in Emergency Department on 04/27/2019 for the symptoms described in the history of present illness. She was evaluated in the context of the global COVID-19 pandemic, which necessitated consideration that the patient might be at risk for infection with the SARS-CoV-2 virus that causes COVID-19. Institutional protocols and algorithms that pertain to the evaluation of patients at risk for COVID-19 are in a state of rapid change based on information released by regulatory bodies including the CDC and federal and state organizations. These policies and algorithms were followed during the patient's care in the ED.   Final Clinical Impressions(s) / ED Diagnoses   Final diagnoses:  Shortness of breath  Community acquired pneumonia, unspecified laterality  Elevated troponin    ED Discharge Orders    None       Arville Lime, [place] 04/27/19 2333    Charlesetta Shanks, MD 04/28/19 1735

## 2019-04-27 NOTE — H&P (Addendum)
History and Physical    [name] [name]:[id number] DOB: 1951/06/12 DOA: 04/27/2019  Referring MD/NP/PA:   PCP: [facility]   Patient coming from:  The patient is coming from home.  At baseline, pt is independent for most of ADL.        Chief Complaint: SOB  HPI: [name] [name] is a 68 y.o. female with medical history significant of CKD-4, hypertension, hyperlipidemia, diabetes mellitus, stroke, GERD, gout, depression, iron deficiency anemia, CHF with EF of 40%, CAD, CABG, obesity, PVD, tobacco abuse, cocaine abuse, who presents with shortness of breath.  Patient states that she started having shortness of breath and anxiety at about 4:30 PM after argument with husband.  She also has dry cough, sore throat, runny nose in the past 2 days.  Denies chest pain. No fever or chill. No recent long distant traveling.  Denies tenderness in the calf areas. Patient does not have nausea, vomiting, diarrhea, abdominal pain, symptoms of UTI.  Denies dark stool or bloody stool bowel movement.  ED Course: pt was found to have negative COVID-19, troponin 0.38, WBC 18.7, lactic acid 3.1, BNP 619, positive d-dimer 4.21, CRP 1.0, LDH 197, procalcitonin 2.7, ferritin 57, fibrinogen 588, triglyceride 66, potassium 5.5, worsening renal function.  Temperature 97.7, tachycardia, tachypnea, oxygen saturation 88% on room air. ECG with sinus tachycardia and lateral TWI (I and aVL). CXR notable for some reticular opacities, possibly atypical PNA. Pt is admitted to telemetry bed as inpatient.  Cardiology, Dr. Charissa Bash was consulted.  Review of Systems:   General: no fevers, chills, no body weight gain,  has fatigue HEENT: no blurry vision, hearing changes or sore throat Respiratory: has dyspnea, coughing, no wheezing CV: no chest pain, no palpitations GI: no nausea, vomiting, abdominal pain, diarrhea, constipation GU: no dysuria, burning on urination, increased urinary frequency, hematuria   Ext: has trace leg edema Neuro: no unilateral weakness, numbness, or tingling, no vision change or hearing loss Skin: no rash, no skin tear. MSK: No muscle spasm, no deformity, no limitation of range of movement in spin Heme: No easy bruising.  Travel history: No recent long distant travel.  Allergy:  Allergies  Allergen Reactions  . Naproxen Rash    Past Medical History:  Diagnosis Date  . Anemia 08/2016  . Angiodysplasia of colon   . Arthritis of left shoulder region 03/23/2013  . Chronic combined systolic and diastolic CHF (congestive heart failure) (HCC)    a. EF 40-45%, mild LVH, mid apicalanteroseptal and apical HK.  . CKD (chronic kidney disease), stage III ([place])   . Cocaine abuse ([place])    crack cocaine heavily until 2008 then sporadic use since then  . Coronary artery disease    a. 06/2012 NSTEMI/CABG x 3 (LIMA->LAD, VG->OM2, VG->LCX);  b. 04/2015 MV: EF<30%, mid ant, apicalanterior, apical infarct;  c. 04/2015 Cath: LM nl, LAD 90p, LCX 85m, OM1 min irregs, RCA mild dzs, LIMA->LAD nl w/ dist LAD dzs, VG->OM2 nl, VG->LCX nl-->Med Rx.  . CVA (cerebral infarction)    a. right internal capsule stroke in 12/2006  . Diabetes mellitus    diagnosed in 2008  . Essential hypertension   . Glaucoma   . Gout   . HFrEF (heart failure with reduced ejection fraction) ([place])   . Hyperlipidemia   . Hyperparathyroidism, secondary renal ([place])   . Left-sided sensory deficit present   . Obesity, morbid ([place])   . PVD (peripheral vascular disease) ([place])    a. 06/2012  ABI's: R - 0.73, L - 0.71.  Marland Kitchen Renal mass, right   . Shortness of breath dyspnea   . Stroke ([place])   . Thyroid nodule    FNA in 3244 showed follicular cells but not definate neoplasm  . Tobacco abuse   . Trichomoniasis     Past Surgical History:  Procedure Laterality Date  . CARDIAC CATHETERIZATION    . CARDIAC CATHETERIZATION N/A 05/17/2015   Procedure: Left Heart Cath and Cors/Grafts Angiography;  Surgeon: [name],  MD;  Location: [facility];  Service: Cardiovascular;  Laterality: N/A;  . COLONOSCOPY WITH PROPOFOL N/A 04/21/2016   Procedure: COLONOSCOPY WITH PROPOFOL;  Surgeon: Irene Shipper, MD;  Location: [facility];  Service: Endoscopy;  Laterality: N/A;  . CORONARY ARTERY BYPASS GRAFT  07/09/2012   Procedure: CORONARY ARTERY BYPASS GRAFTING (CABG);  Surgeon: Ivin Poot, MD;  Location: [facility];  Service: Open Heart Surgery;  Laterality: N/A;  . ESOPHAGOGASTRODUODENOSCOPY N/A 04/20/2016   Procedure: ESOPHAGOGASTRODUODENOSCOPY (EGD);  Surgeon: Gatha Mayer, MD;  Location: [facility] ENDOSCOPY;  Service: Endoscopy;  Laterality: N/A;  . LEFT HEART CATHETERIZATION WITH CORONARY ANGIOGRAM N/A 06/29/2012   Procedure: LEFT HEART CATHETERIZATION WITH CORONARY ANGIOGRAM;  Surgeon: Peter M Martinique, MD;  Location: [facility] CATH LAB;  Service: Cardiovascular;  Laterality: N/A;  . STERNAL WOUND DEBRIDEMENT  08/17/2012   Procedure: STERNAL WOUND DEBRIDEMENT;  Surgeon: Ivin Poot, MD;  Location[facility] OR;  Service: Thoracic;  Laterality: N/A;  wound vac application  . STERNAL WOUND DEBRIDEMENT  08/24/2012   Procedure: STERNAL WOUND DEBRIDEMENT;  Surgeon: Ivin Poot, MD;  Location: [place];  Service: Thoracic;  Laterality: N/A;  . STERNAL WOUND DEBRIDEMENT  09/01/2012   Procedure: STERNAL WOUND DEBRIDEMENT;  Surgeon: Ivin Poot, MD;  Location: [place];  Service: Thoracic;  Laterality: N/A;  . STERNAL WOUND DEBRIDEMENT  09/20/2012   Procedure: STERNAL WOUND DEBRIDEMENT;  Surgeon: Ivin Poot, MD;  Location: [facility] OR;  Service: Thoracic;  Laterality: N/A;  wound vac change    Social History:  reports that she has been smoking. She has a 12.50 pack-year smoking history. She has never used smokeless tobacco. She reports previous drug use. Drug: Cocaine. She reports that she does not drink alcohol.  Family History:  Family History  Problem Relation Age of Onset  . Diabetes Mother   . Hypertension Mother   . Cancer Mother    . Hyperlipidemia Father   . Hypertension Father   . Kidney disease Father   . Gout Father   . Cerebrovascular Accident Father   . Other Other        no known family CAD     Prior to Admission medications   Medication Sig Start Date End Date Taking? Authorizing Provider  allopurinol (ZYLOPRIM) 100 MG tablet Take 100 mg by mouth daily. 11/25/17  Yes [provider]  aspirin 81 MG tablet Take 81 mg by mouth daily.    Yes [provider]  brinzolamide (AZOPT) 1 % ophthalmic suspension Place 1 drop into both eyes 3 (three) times daily.   Yes [provider]  buPROPion (WELLBUTRIN SR) 150 MG 12 hr tablet Take 1 tablet (150 mg total) by mouth 2 (two) times daily. 01/16/16  Yes Mikhail, Velta Addison, DO  cetirizine (ZYRTEC) 10 MG tablet Take 10 mg by mouth daily as needed for allergies.    Yes [provider]  ferrous sulfate 325 (65 FE) MG tablet Take 1 tablet (325 mg  total) by mouth 2 (two) times daily with a meal. Patient taking differently: Take 325 mg by mouth 3 (three) times daily with meals.  04/21/16  Yes Bonnielee Haff, MD  Fluticasone-Salmeterol (ADVAIR) 250-50 MCG/DOSE AEPB Inhale 1 puff into the lungs every 12 (twelve) hours.   Yes [provider]  furosemide (LASIX) 40 MG tablet Take 1 tablet (40 mg total) by mouth daily. Take additional dose if you experience weight gain. Patient taking differently: Take 40-80 mg by mouth See admin instructions. Take 2 tablets in the morning and 1 tablet at bedtime 05/17/15  Yes Mikhail, [place], DO  gabapentin (NEURONTIN) 300 MG capsule Take 300 mg by mouth 3 (three) times daily.    Yes [provider]  glimepiride (AMARYL) 4 MG tablet Take 4 mg by mouth daily. 04/07/16  Yes [provider]  Insulin Glargine (LANTUS SOLOSTAR) 100 UNIT/ML Solostar Pen Inject 25 Units into the skin daily at 10 pm. Patient taking differently: Inject 21 Units into the skin daily at 10 pm.  01/16/16  Yes Mikhail,  Velta Addison, DO  latanoprost (XALATAN) 0.005 % ophthalmic solution Place 1 drop into both eyes at bedtime.   Yes [provider]  LINZESS 72 MCG capsule Take 72 mcg by mouth daily before breakfast.  11/21/17  Yes [provider]  metolazone (ZAROXOLYN) 5 MG tablet Take 5 mg by mouth daily. 11/06/17  Yes [provider]  metoprolol tartrate (LOPRESSOR) 25 MG tablet Take 0.5 tablets (12.5 mg total) by mouth 2 (two) times daily. Patient taking differently: Take 25 mg by mouth 2 (two) times daily.  01/16/16  Yes Mikhail, Maryann, DO  pantoprazole (PROTONIX) 40 MG tablet Take 1 tablet (40 mg total) by mouth daily. 09/23/16  Yes Thurnell Lose, MD  polyethylene glycol (MIRALAX / GLYCOLAX) packet Take 17 g by mouth daily. 01/26/18  Yes Horton, Barbette Hair, MD  pravastatin (PRAVACHOL) 40 MG tablet Take 40 mg by mouth at bedtime.   Yes [provider]  PROAIR HFA 108 (90 Base) MCG/ACT inhaler Inhale 1-2 puffs into the lungs every 6 (six) hours as needed for wheezing.  11/17/17  Yes [provider]  traMADol (ULTRAM) 50 MG tablet Take 50 mg by mouth 3 (three) times daily.    Yes [provider]  HYDROcodone-acetaminophen (NORCO/VICODIN) 5-325 MG tablet Take 1 tablet by mouth every 8 (eight) hours as needed. Patient not taking: Reported on 01/26/2018 12/23/17   Varney Biles, MD  predniSONE (DELTASONE) 10 MG tablet Take 6 tablets (60 mg total) by mouth daily. Patient not taking: Reported on 01/26/2018 12/23/17   Varney Biles, MD    Physical Exam: Vitals:   04/27/19 2030 04/27/19 2100 04/27/19 2130 04/27/19 2200  BP: 137/69 133/66 (!) 140/58 139/71  Pulse: (!) 102 97 92 89  Resp: (!) 21 20 (!) 22 (!) 22  Temp:      TempSrc:      SpO2: 95% 97% 100% 100%   General: Not in acute distress HEENT:       Eyes: PERRL, EOMI, no scleral icterus.       ENT: No discharge from the ears and nose, no pharynx injection, no tonsillar enlargement.        Neck:  Difficult to assess JVD due to morbid obesity.  No bruit, no mass felt. Heme: No neck lymph node enlargement. Cardiac: S1/S2, RRR, No murmurs, No gallops or rubs. Respiratory: No rales, wheezing, rhonchi or rubs. GI: Soft, nondistended, nontender, no rebound pain, no organomegaly,  BS present. GU: No hematuria Ext: has trace leg edema bilaterally. 2+DP/PT pulse bilaterally. Musculoskeletal: No joint deformities, No joint redness or warmth, no limitation of ROM in spin. Skin: No rashes.  Neuro: Alert, oriented X3, cranial nerves II-XII grossly intact, moves all extremities normally.  Psych: Patient is not psychotic, no suicidal or hemocidal ideation.  Labs on Admission: I have personally reviewed following labs and imaging studies  CBC: Recent Labs  Lab 04/27/19 1938  WBC 18.7*  NEUTROABS 16.8*  HGB 11.0*  HCT 38.1  MCV 84.1  PLT 725   Basic Metabolic Panel: Recent Labs  Lab 04/27/19 1938  NA 142  K 5.5*  CL 113*  CO2 15*  GLUCOSE 290*  BUN 38*  CREATININE 3.15*  CALCIUM 9.0   GFR: CrCl cannot be calculated (Unknown ideal weight.). Liver Function Tests: Recent Labs  Lab 04/27/19 1938  AST 29  ALT 16  ALKPHOS 161*  BILITOT 0.4  PROT 7.2  ALBUMIN 3.6   No results for input(s): LIPASE, AMYLASE in the last 168 hours. No results for input(s): AMMONIA in the last 168 hours. Coagulation Profile: No results for input(s): INR, PROTIME in the last 168 hours. Cardiac Enzymes: Recent Labs  Lab 04/27/19 1938  TROPONINI 0.38*   BNP (last 3 results) No results for input(s): PROBNP in the last 8760 hours. HbA1C: No results for input(s): HGBA1C in the last 72 hours. CBG: No results for input(s): GLUCAP in the last 168 hours. Lipid Profile: Recent Labs    04/27/19 2126  TRIG 66   Thyroid Function Tests: No results for input(s): TSH, T4TOTAL, FREET4, T3FREE, THYROIDAB in the last 72 hours. Anemia Panel: Recent Labs    04/27/19 2126  FERRITIN 57   Urine  analysis:    Component Value Date/Time   COLORURINE YELLOW 01/26/2018 [place] 01/26/2018 0442   LABSPEC 1.012 01/26/2018 0442   PHURINE 5.0 01/26/2018 0442   GLUCOSEU NEGATIVE 01/26/2018 0442   HGBUR NEGATIVE 01/26/2018 0442   BILIRUBINUR NEGATIVE 01/26/2018 0442   KETONESUR NEGATIVE 01/26/2018 0442   PROTEINUR NEGATIVE 01/26/2018 0442   UROBILINOGEN 0.2 05/12/2015 0938   NITRITE NEGATIVE 01/26/2018 0442   LEUKOCYTESUR NEGATIVE 01/26/2018 0442   Sepsis Labs: @LABRCNTIP (procalcitonin:4,lacticidven:4) ) Recent Results (from the past 240 hour(s))  SARS Coronavirus 2 [facility] order, Performed in [place] hospital lab)     Status: None   Collection Time: 04/27/19 10:10 PM  Result Value Ref Range Status   SARS Coronavirus 2 NEGATIVE NEGATIVE Final    Comment: (NOTE) If result is NEGATIVE SARS-CoV-2 target nucleic acids are NOT DETECTED. The SARS-CoV-2 RNA is generally detectable in upper and lower  respiratory specimens during the acute phase of infection. The lowest  concentration of SARS-CoV-2 viral copies this assay can detect is 250  copies / mL. A negative result does not preclude SARS-CoV-2 infection  and should not be used as the sole basis for treatment or other  patient management decisions.  A negative result may occur with  improper specimen collection / handling, submission of specimen other  than nasopharyngeal swab, presence of viral mutation(s) within the  areas targeted by this assay, and inadequate number of viral copies  (<250 copies / mL). A negative result must be combined with clinical  observations, patient history, and epidemiological information. If result is POSITIVE SARS-CoV-2 target nucleic acids are DETECTED. The SARS-CoV-2 RNA is generally detectable in upper and lower  respiratory specimens dur ing the acute phase of infection.  Positive  results are indicative of active infection with SARS-CoV-2.  Clinical  correlation with  patient history and other diagnostic information is  necessary to determine patient infection status.  Positive results do  not rule out bacterial infection or co-infection with other viruses. If result is PRESUMPTIVE POSTIVE SARS-CoV-2 nucleic acids MAY BE PRESENT.   A presumptive positive result was obtained on the submitted specimen  and confirmed on repeat testing.  While 2019 novel coronavirus  (SARS-CoV-2) nucleic acids may be present in the submitted sample  additional confirmatory testing may be necessary for epidemiological  and / or clinical management purposes  to differentiate between  SARS-CoV-2 and other Sarbecovirus currently known to infect humans.  If clinically indicated additional testing with an alternate test  methodology [phone]) is advised. The SARS-CoV-2 RNA is generally  detectable in upper and lower respiratory sp ecimens during the acute  phase of infection. The expected result is Negative. Fact Sheet for Patients:  StrictlyIdeas.no Fact Sheet for Healthcare Providers: BankingDealers.co.za This test is not yet approved or cleared by the [place] FDA and has been authorized for detection and/or diagnosis of SARS-CoV-2 by FDA under an Emergency Use Authorization (EUA).  This EUA will remain in effect (meaning this test can be used) for the duration of the COVID-19 declaration under Section 564(b)(1) of the Act, 21 U.S.C. section 360bbb-3(b)(1), unless the authorization is terminated or revoked sooner. Performed at [facility], [place] [address], [place], [place] [address]      Radiological Exams on Admission: Dg Chest Port 1 View  Result Date: 04/27/2019 CLINICAL DATA:  68 year old female with a history shortness of breath EXAM: PORTABLE CHEST 1 VIEW COMPARISON:  09/18/2016 FINDINGS: Cardiomediastinal silhouette unchanged in size and contour. Surgical changes of median sternotomy and CABG. Coarsened  interstitial markings throughout, with reticular pattern of opacity more pronounced than the prior. No pneumothorax or pleural effusion. No confluent airspace disease. IMPRESSION: Reticular pattern of opacity, potentially atypical pneumonia, bronchitis, edema, and/or developing interstitial fibrosis. Electronically Signed   By: Corrie Mckusick D.O.   On: 04/27/2019 19:54     EKG: Independently reviewed.  Sinus rhythm, QTC 517, LAE, LVH, poor R wave progression, T wave inversion in lateral leads.   Assessment/Plan Principal Problem:   Acute on chronic respiratory failure with hypoxia (HCC) Active Problems:   Hyperlipidemia   History of CVA (cerebrovascular accident)   NSTEMI (non-ST elevated myocardial infarction) ([place])   History of cocaine abuse ([place])   Essential hypertension   Tobacco abuse   S/P CABG (coronary artery bypass graft)   Type 2 diabetes mellitus with renal manifestations, controlled (HCC)   Acute on chronic combined systolic and diastolic congestive heart failure (HCC)   CKD (chronic kidney disease) stage 4, GFR 15-29 ml/min (HCC)   CAD in native artery   Substance abuse (HCC)   Iron deficiency anemia   Elevated troponin   Lobar pneumonia (HCC)   Sepsis (HCC)   Hyperkalemia   Acute on chronic respiratory failure with hypoxia and sepsis due to lobar PNA/atypical pneumonia: COVID-19 negative.  Patient meets critical for sepsis with leukocytosis, tachycardia and tachypnea.  Lactic acid is elevated 3.1.  Since patient also has component of possible CHF exacerbation, will not start IV fluid now. Pt's D-dimer is 4.21, cannot r/o PE.  - will admit to tele bed as inpt - IV Rocephin and doxycycline (patient received 1 dose of azithromycin in ED. Patient has QT prolongation, therefore changed to doxycycline) - Mucinex for  cough  - Atrovent inhaler, prn albuterol or Xopenex inhaler, Dulera inhaler - Urine legionella and S. pneumococcal antigen - Follow up blood culture x2,  sputum culture and respiratory virus panel, plus Flu pcr - will get Procalcitonin and trend lactic acid level per sepsis protocol - IVF: Hold IVF due to possible CHF exacerbation - f/u V/Q scan in AM and LE venous doppler to r/o DVT  Hx of CAD and elevated trop and NSTEMI: s/p of CABG. trop 0.38. No CP. EKG showed T wave inversion in lateral leads.  Cardiology, Dr. Charissa Bash was consulted, who recommended to repeat troponin, if elevated further--> will start IV heparin.  Since I cannot rule out PE due to positive d-dimer.  I will start heparin now.  Patient denies dark stool or rectal bleeding. - highly appreciated Dr. Cecille Aver recommendations. - start IV heparin - cycle CE q6 x3 and repeat EKG in the am  - prn Nitroglycerin, and aspirin, pravastatin, metoprolol - Risk factor stratification: will check FLP and A1C, UDS - 2d echo  Addendum: trop increased from 0.38 to 6.12 -pt is already on IV heparin -will change pravastatin to 80 mg Lipitor daily -f/u Card recommendation  Hyperlipidemia; -Pravastatin  History of CVA (cerebrovascular accident): -On aspirin and pravastatin  History of cocaine abuse ([place]) and tobacco abuse: pt states that she used cocaine in this afternoon -Did counseling about importance of quitting substance use -Nicotine patch - check UDS  HTN:  -Continue home medications: Metoprolol -IV hydralazine prn  Type 2 diabetes mellitus with renal manifestations, controlled ([place]): Last A1c 5.9 on 09/16/16, well controled. Patient is taking Amaryl and Lantus at home -will decrease Lantus dose from 21 to 15 unit daily -SSI  Acute on chronic combined systolic and diastolic congestive heart failure [facility]): pt has trace leg edema, elevated BNP 619.  Chest x-ray showed atypical infiltration, but cannot completely rule out interstitial edema, patient may have mild CHF exacerbation.  Given worsening renal function and sepsis, will not give IV diuretics.  Will hold home Lasix  temporarily. -Watch volume status closely -Continue aspirin and metoprolol -Follow-up with cardiologist further recommendations  CKD (chronic kidney disease) stage 4, GFR 15-29 ml/min ([place]): Renal function has worsened.  Baseline creatinine 2.5, her creatinine is 3.15, BUN 38. -Hold diuretics -Follow-up by BMP  Iron deficiency anemia: Hemoglobin stable, 11.0. - Continue iron supplement  Hyperkalemia: K-5.5 -treated with 5 units of NovoLog and the D50 - Kayexalate 30 g   Inpatient status:  # Patient requires inpatient status due to high intensity of service, high risk for further deterioration and high frequency of surveillance required.  I certify that at the point of admission it is my clinical judgment that the patient will require inpatient hospital care spanning beyond 2 midnights from the point of admission.  . This patient has multiple chronic comorbidities including CKD-4, hypertension, hyperlipidemia, diabetes mellitus, stroke, GERD, gout, depression, iron deficiency anemia, CHF with EF of 40%, CAD, CABG, obesity, PVD, tobacco abuse, cocaine abuse . Now patient has presenting with sepsis, acute on chronic respiratory failure due to atypical/lobar pneumonia and also possible CHF exacerbation.  Patient also had hyperkalemia, worsening renal function, elevated troponin and possible non-STEMI . The worrisome physical exam findings include leg edema . The initial radiographic and laboratory data are worrisome because of positive troponin, elevated lactic acid, leukocytosis, hyperkalemia, worsening renal function, positive d-dimer, elevated procalcitonin.  Chest x-ray showed atypical infiltration for pneumonia. . Current medical needs: please see my assessment and plan .  Predictability of an adverse outcome (risk): Patient has multiple comorbidities, now presents with multiple issues as listed above, her presentation is highly complicated.  Patient is at high risk for deteriorating.  Will  need to be treated in hospital for at least 2 days.    DVT ppx: on IV Heparin  Code Status: Full code Family Communication: None at bed side.     Disposition Plan:  Anticipate discharge back to previous home environment Consults called:  Card, Dr. Charissa Bash Admission status:  Inpatient/tele    Date of Service 04/28/2019    [facility]   If 7PM-7AM, please contact night-coverage [email or web address] Password [email or web address] 04/28/2019, 12:21 AM

## 2019-04-28 ENCOUNTER — Inpatient Hospital Stay (HOSPITAL_COMMUNITY): Payer: Medicare Other

## 2019-04-28 ENCOUNTER — Encounter (HOSPITAL_COMMUNITY): Payer: Medicare Other

## 2019-04-28 ENCOUNTER — Encounter (HOSPITAL_COMMUNITY): Payer: Self-pay

## 2019-04-28 DIAGNOSIS — J9621 Acute and chronic respiratory failure with hypoxia: Secondary | ICD-10-CM | POA: Diagnosis present

## 2019-04-28 DIAGNOSIS — N184 Chronic kidney disease, stage 4 (severe): Secondary | ICD-10-CM | POA: Diagnosis present

## 2019-04-28 DIAGNOSIS — I214 Non-ST elevation (NSTEMI) myocardial infarction: Secondary | ICD-10-CM | POA: Diagnosis present

## 2019-04-28 DIAGNOSIS — M7989 Other specified soft tissue disorders: Secondary | ICD-10-CM

## 2019-04-28 DIAGNOSIS — Z8673 Personal history of transient ischemic attack (TIA), and cerebral infarction without residual deficits: Secondary | ICD-10-CM | POA: Diagnosis not present

## 2019-04-28 DIAGNOSIS — Z23 Encounter for immunization: Secondary | ICD-10-CM | POA: Diagnosis present

## 2019-04-28 DIAGNOSIS — A419 Sepsis, unspecified organism: Secondary | ICD-10-CM | POA: Diagnosis present

## 2019-04-28 DIAGNOSIS — R0602 Shortness of breath: Secondary | ICD-10-CM

## 2019-04-28 DIAGNOSIS — M19012 Primary osteoarthritis, left shoulder: Secondary | ICD-10-CM | POA: Diagnosis present

## 2019-04-28 DIAGNOSIS — M109 Gout, unspecified: Secondary | ICD-10-CM | POA: Diagnosis present

## 2019-04-28 DIAGNOSIS — Z6841 Body Mass Index (BMI) 40.0 and over, adult: Secondary | ICD-10-CM | POA: Diagnosis not present

## 2019-04-28 DIAGNOSIS — I2510 Atherosclerotic heart disease of native coronary artery without angina pectoris: Secondary | ICD-10-CM | POA: Diagnosis present

## 2019-04-28 DIAGNOSIS — N2581 Secondary hyperparathyroidism of renal origin: Secondary | ICD-10-CM | POA: Diagnosis present

## 2019-04-28 DIAGNOSIS — E1151 Type 2 diabetes mellitus with diabetic peripheral angiopathy without gangrene: Secondary | ICD-10-CM | POA: Diagnosis present

## 2019-04-28 DIAGNOSIS — E1122 Type 2 diabetes mellitus with diabetic chronic kidney disease: Secondary | ICD-10-CM | POA: Diagnosis present

## 2019-04-28 DIAGNOSIS — F419 Anxiety disorder, unspecified: Secondary | ICD-10-CM | POA: Diagnosis present

## 2019-04-28 DIAGNOSIS — F1410 Cocaine abuse, uncomplicated: Secondary | ICD-10-CM | POA: Diagnosis present

## 2019-04-28 DIAGNOSIS — J181 Lobar pneumonia, unspecified organism: Secondary | ICD-10-CM | POA: Diagnosis present

## 2019-04-28 DIAGNOSIS — D509 Iron deficiency anemia, unspecified: Secondary | ICD-10-CM | POA: Diagnosis present

## 2019-04-28 DIAGNOSIS — N179 Acute kidney failure, unspecified: Secondary | ICD-10-CM | POA: Diagnosis present

## 2019-04-28 DIAGNOSIS — H409 Unspecified glaucoma: Secondary | ICD-10-CM | POA: Diagnosis present

## 2019-04-28 DIAGNOSIS — I5043 Acute on chronic combined systolic (congestive) and diastolic (congestive) heart failure: Secondary | ICD-10-CM | POA: Diagnosis present

## 2019-04-28 DIAGNOSIS — R7989 Other specified abnormal findings of blood chemistry: Secondary | ICD-10-CM | POA: Diagnosis not present

## 2019-04-28 DIAGNOSIS — E1165 Type 2 diabetes mellitus with hyperglycemia: Secondary | ICD-10-CM | POA: Diagnosis present

## 2019-04-28 DIAGNOSIS — F1411 Cocaine abuse, in remission: Secondary | ICD-10-CM | POA: Diagnosis not present

## 2019-04-28 DIAGNOSIS — I130 Hypertensive heart and chronic kidney disease with heart failure and stage 1 through stage 4 chronic kidney disease, or unspecified chronic kidney disease: Secondary | ICD-10-CM | POA: Diagnosis present

## 2019-04-28 DIAGNOSIS — E785 Hyperlipidemia, unspecified: Secondary | ICD-10-CM | POA: Diagnosis present

## 2019-04-28 DIAGNOSIS — Z951 Presence of aortocoronary bypass graft: Secondary | ICD-10-CM | POA: Diagnosis not present

## 2019-04-28 DIAGNOSIS — J440 Chronic obstructive pulmonary disease with acute lower respiratory infection: Secondary | ICD-10-CM | POA: Diagnosis present

## 2019-04-28 DIAGNOSIS — Z20828 Contact with and (suspected) exposure to other viral communicable diseases: Secondary | ICD-10-CM | POA: Diagnosis present

## 2019-04-28 LAB — BASIC METABOLIC PANEL
Anion gap: 9 (ref 5–15)
BUN: 39 mg/dL — ABNORMAL HIGH (ref 8–23)
CO2: 22 mmol/L (ref 22–32)
Calcium: 8.7 mg/dL — ABNORMAL LOW (ref 8.9–10.3)
Chloride: 113 mmol/L — ABNORMAL HIGH (ref 98–111)
Creatinine, Ser: 2.99 mg/dL — ABNORMAL HIGH (ref 0.44–1.00)
GFR calc Af Amer: 18 mL/min — ABNORMAL LOW (ref >60)
GFR calc non Af Amer: 15 mL/min — ABNORMAL LOW (ref >60)
Glucose, Bld: 82 mg/dL (ref 70–99)
Potassium: 4.8 mmol/L (ref 3.5–5.1)
Sodium: 144 mmol/L (ref 135–145)

## 2019-04-28 LAB — INFLUENZA PANEL BY PCR (TYPE A & B)
Influenza A By PCR: NEGATIVE
Influenza B By PCR: NEGATIVE

## 2019-04-28 LAB — URINALYSIS, ROUTINE W REFLEX MICROSCOPIC
Bilirubin Urine: NEGATIVE
Glucose, UA: NEGATIVE mg/dL
Hgb urine dipstick: NEGATIVE
Ketones, ur: NEGATIVE mg/dL
Leukocytes,Ua: NEGATIVE
Nitrite: NEGATIVE
Protein, ur: 100 mg/dL — AB
Specific Gravity, Urine: 1.015 (ref 1.005–1.030)
pH: 5 (ref 5.0–8.0)

## 2019-04-28 LAB — GLUCOSE, CAPILLARY
Glucose-Capillary: 197 mg/dL — ABNORMAL HIGH (ref 70–99)
Glucose-Capillary: 95 mg/dL (ref 70–99)
Glucose-Capillary: 75 mg/dL (ref 70–99)
Glucose-Capillary: 83 mg/dL (ref 70–99)
Glucose-Capillary: 152 mg/dL — ABNORMAL HIGH (ref 70–99)
Glucose-Capillary: 130 mg/dL — ABNORMAL HIGH (ref 70–99)

## 2019-04-28 LAB — CBC
HCT: 31.5 % — ABNORMAL LOW (ref 36.0–46.0)
Hemoglobin: 9.4 g/dL — ABNORMAL LOW (ref 12.0–15.0)
MCH: 24.7 pg — ABNORMAL LOW (ref 26.0–34.0)
MCHC: 29.8 g/dL — ABNORMAL LOW (ref 30.0–36.0)
MCV: 82.9 fL (ref 80.0–100.0)
Platelets: 232 10*3/uL (ref 150–400)
RBC: 3.8 MIL/uL — ABNORMAL LOW (ref 3.87–5.11)
RDW: 18.3 % — ABNORMAL HIGH (ref 11.5–15.5)
WBC: 10 10*3/uL (ref 4.0–10.5)
nRBC: 0 % (ref 0.0–0.2)

## 2019-04-28 LAB — RESPIRATORY PANEL BY PCR

## 2019-04-28 LAB — STREP PNEUMONIAE URINARY ANTIGEN: Strep Pneumo Urinary Antigen: NEGATIVE

## 2019-04-28 LAB — HEPARIN LEVEL (UNFRACTIONATED)
Heparin Unfractionated: 0.39 IU/mL (ref 0.30–0.70)
Heparin Unfractionated: 0.46 IU/mL (ref 0.30–0.70)

## 2019-04-28 LAB — TROPONIN I
Troponin I: 6.12 ng/mL (ref <0.03)
Troponin I: 12.18 ng/mL (ref <0.03)
Troponin I: 11.11 ng/mL (ref <0.03)

## 2019-04-28 LAB — HEMOGLOBIN A1C
Hgb A1c MFr Bld: 6.6 % — ABNORMAL HIGH (ref 4.8–5.6)
Mean Plasma Glucose: 142.72 mg/dL

## 2019-04-28 LAB — RAPID URINE DRUG SCREEN, HOSP PERFORMED
Amphetamines: NOT DETECTED
Barbiturates: NOT DETECTED
Benzodiazepines: NOT DETECTED
Cocaine: POSITIVE — AB
Opiates: NOT DETECTED
Tetrahydrocannabinol: NOT DETECTED

## 2019-04-28 LAB — LACTIC ACID, PLASMA: Lactic Acid, Venous: 1.5 mmol/L (ref 0.5–1.9)

## 2019-04-28 LAB — TYPE AND SCREEN
ABO/RH(D): O POS
Antibody Screen: NEGATIVE

## 2019-04-28 LAB — ECHOCARDIOGRAM COMPLETE
Height: 68 in
Weight: 4614.4 oz

## 2019-04-28 LAB — HIV ANTIBODY (ROUTINE TESTING W REFLEX): HIV Screen 4th Generation wRfx: NONREACTIVE

## 2019-04-28 LAB — LIPID PANEL
Cholesterol: 148 mg/dL (ref 0–200)
HDL: 29 mg/dL — ABNORMAL LOW (ref >40)
LDL Cholesterol: 108 mg/dL — ABNORMAL HIGH (ref 0–99)
Total CHOL/HDL Ratio: 5.1 RATIO
Triglycerides: 57 mg/dL (ref <150)
VLDL: 11 mg/dL (ref 0–40)

## 2019-04-28 LAB — PROTIME-INR
INR: 1.2 (ref 0.8–1.2)
Prothrombin Time: 14.8 seconds (ref 11.4–15.2)

## 2019-04-28 LAB — APTT: aPTT: 28 seconds (ref 24–36)

## 2019-04-28 MED ORDER — PNEUMOCOCCAL VAC POLYVALENT 25 MCG/0.5ML IJ INJ
0.5000 mL | INJECTION | INTRAMUSCULAR | Status: AC
  Administered 2019-04-30: 0.5 mL via INTRAMUSCULAR

## 2019-04-28 MED ORDER — DOXYCYCLINE HYCLATE 100 MG PO TABS
100.0000 mg | ORAL_TABLET | Freq: Two times a day (BID) | ORAL | Status: DC
  Administered 2019-04-28 – 2019-04-30 (×4): 100 mg via ORAL
  Filled 2019-04-28 (×4): qty 1

## 2019-04-28 MED ORDER — ATORVASTATIN CALCIUM 80 MG PO TABS
80.0000 mg | ORAL_TABLET | Freq: Every day | ORAL | Status: DC
  Administered 2019-04-28 – 2019-04-29 (×2): 80 mg via ORAL
  Filled 2019-04-28 (×2): qty 1

## 2019-04-28 MED ORDER — SODIUM CHLORIDE 0.9 % IV SOLN
INTRAVENOUS | Status: DC | PRN
  Administered 2019-04-28: 250 mL via INTRAVENOUS

## 2019-04-28 MED ORDER — TECHNETIUM TO 99M ALBUMIN AGGREGATED
1.7000 | Freq: Once | INTRAVENOUS | Status: AC | PRN
  Administered 2019-04-28: 1.7 via INTRAVENOUS

## 2019-04-28 MED ORDER — SODIUM CHLORIDE 0.9 % IV SOLN
100.0000 mg | Freq: Two times a day (BID) | INTRAVENOUS | Status: DC
  Administered 2019-04-28: 100 mg via INTRAVENOUS
  Filled 2019-04-28 (×3): qty 100

## 2019-04-28 MED ORDER — ALPRAZOLAM 0.25 MG PO TABS: 0.5000 mg | ORAL_TABLET | Freq: Two times a day (BID) | ORAL | Status: DC | PRN

## 2019-04-28 MED ORDER — ORAL CARE MOUTH RINSE
15.0000 mL | Freq: Two times a day (BID) | OROMUCOSAL | Status: DC
  Administered 2019-04-28 – 2019-04-30 (×5): 15 mL via OROMUCOSAL

## 2019-04-28 NOTE — Progress Notes (Signed)
Progress Note  Patient Name: [name] [name] Date of Encounter: 04/28/2019  Primary Cardiologist: Dr. Peter Martinique, MD   Subjective   No CP, no SOB. Sleeping but easily arousable.  Actually feels relaxed being out of her home environment at this time.  Inpatient Medications    Scheduled Meds:  allopurinol  100 mg Oral Daily   aspirin EC  81 mg Oral Daily   atorvastatin  80 mg Oral q1800   brinzolamide  1 drop Both Eyes TID   buPROPion  150 mg Oral BID   gabapentin  300 mg Oral TID   insulin aspart  0-9 Units Subcutaneous TID WC   insulin glargine  15 Units Subcutaneous Q2200   ipratropium  2 puff Inhalation Q4H   latanoprost  1 drop Both Eyes QHS   linaclotide  72 mcg Oral QAC breakfast   loratadine  10 mg Oral Daily   mouth rinse  15 mL Mouth Rinse BID   mometasone-formoterol  2 puff Inhalation BID   nicotine  21 mg Transdermal Daily   pantoprazole  40 mg Oral Daily   [START ON 04/29/2019] pneumococcal 23 valent vaccine  0.5 mL Intramuscular Tomorrow-1000   Continuous Infusions:  sodium chloride 250 mL (04/28/19 0651)   cefTRIAXone (ROCEPHIN)  IV     doxycycline (VIBRAMYCIN) IV 100 mg (04/28/19 6063)   heparin 1,400 Units/hr (04/28/19 0649)   PRN Meds: sodium chloride, acetaminophen, albuterol, ALPRAZolam, dextromethorphan-guaiFENesin, hydrALAZINE, HYDROcodone-acetaminophen, nitroGLYCERIN, polyethylene glycol   Vital Signs    Vitals:   04/28/19 0355 04/28/19 0600 04/28/19 0753 04/28/19 0812  BP: 135/79   (!) 143/93  Pulse: 80   72  Resp: 20     Temp: 98.3 F (36.8 C)     TempSrc: Oral     SpO2: 100%  99% 99%  Weight:  130.8 kg    Height:        Intake/Output Summary (Last 24 hours) at 04/28/2019 1003 Last data filed at 04/28/2019 0820 Gross per 24 hour  Intake 175.13 ml  Output 300 ml  Net -124.87 ml   Filed Weights   04/28/19 0156 04/28/19 0600  Weight: 131.5 kg 130.8 kg    Physical Exam   GEN: Well nourished, well  developed, in no acute distress.  HEENT: Grossly normal.  Neck: Supple, no JVD, carotid bruits, or masses. Cardiac: RRR, no murmurs, rubs, or gallops. No clubbing, cyanosis, edema.  Radials/DP/PT 2+ and equal bilaterally.  Respiratory:  Respirations regular and unlabored, clear to auscultation bilaterally. GI: Soft, nontender, nondistended, BS + x 4. MS: no deformity or atrophy. Skin: warm and dry, no rash. Neuro:  Strength and sensation are intact. Psych: AAOx3.  Normal affect.  Labs    Chemistry Recent Labs  Lab 04/27/19 1938 04/28/19 0749  NA 142 144  K 5.5* 4.8  CL 113* 113*  CO2 15* 22  GLUCOSE 290* 82  BUN 38* 39*  CREATININE 3.15* 2.99*  CALCIUM 9.0 8.7*  PROT 7.2  --   ALBUMIN 3.6  --   AST 29  --   ALT 16  --   ALKPHOS 161*  --   BILITOT 0.4  --   GFRNONAA 14* 15*  GFRAA 17* 18*  ANIONGAP 14 9     Hematology Recent Labs  Lab 04/27/19 1938  WBC 18.7*  RBC 4.53  HGB 11.0*  HCT 38.1  MCV 84.1  MCH 24.3*  MCHC 28.9*  RDW 18.1*  PLT 308  Cardiac Enzymes Recent Labs  Lab 04/27/19 1938 04/28/19 0026 04/28/19 0547  TROPONINI 0.38* 6.12* 12.18*   No results for input(s): TROPIPOC in the last 168 hours.   BNP Recent Labs  Lab 04/27/19 1942  BNP 619.8*     DDimer  Recent Labs  Lab 04/27/19 2126  DDIMER 4.21*     Radiology    Dg Chest Port 1 View  Result Date: 04/27/2019 CLINICAL DATA:  68 year old female with a history shortness of breath EXAM: PORTABLE CHEST 1 VIEW COMPARISON:  09/18/2016 FINDINGS: Cardiomediastinal silhouette unchanged in size and contour. Surgical changes of median sternotomy and CABG. Coarsened interstitial markings throughout, with reticular pattern of opacity more pronounced than the prior. No pneumothorax or pleural effusion. No confluent airspace disease. IMPRESSION: Reticular pattern of opacity, potentially atypical pneumonia, bronchitis, edema, and/or developing interstitial fibrosis. Electronically Signed   By:  Corrie Mckusick D.O.   On: 04/27/2019 19:54   Telemetry    12 beats of VT 5am - asymptomatic  - Personally Reviewed  ECG    04/27/2019 NSR with TWI inversion in lead 1 and avL- Personally Reviewed  Cardiac Studies   Cardiac catheterization 05/17/2015:  Left Main  The vessel is angiographically normal.  Left Anterior Descending  Prox LAD lesion 90% stenosed  eccentric .  Left Circumflex  Mid Cx lesion 50% stenosed  Mid Cx lesion.  First Obtuse Marginal Branch  There is mild.  Right Coronary Artery  There is mildthe vessel.  Free LIMA Graft to Prox LAD  LIMA is normal in caliber, and is anatomically normal. There is mild to moderate LAD stenosis just beyond the LIMA insertion site. The LIMA graft is large in caliber and it inserts onto a small diffusely diseased LAD.  single graft Graft to 2nd Mrg  SVG was injected is normal in caliber. There is mild diffuse disease in the graft. There is diffuse disease in the second obtuse marginal beyond the graft insertion site.  Graft to Dist Cx  was injected is normal in caliber. There is mild diffuse disease in the graft.    Echocardiogram 04/18/2016: - Left ventricle: The cavity size was mildly dilated. Systolic function was mildly to moderately reduced. The estimated ejection fraction was in the range of 40% to 45%. There is severe hypokinesis of the entireinferoseptal myocardium. There is akinesis of the basal-midinferior myocardium. There is akinesis of the apicalanterior myocardium. There is hypokinesis of the midanterior myocardium. Features are consistent with a pseudonormal left ventricular filling pattern, with concomitant abnormal relaxation and increased filling pressure (grade 2 diastolic dysfunction). Doppler parameters are consistent with high ventricular filling pressure. - Mitral valve: Calcified annulus. - Left atrium: The atrium was mildly dilated. - Atrial septum: There was increased thickness  of the septum, consistent with lipomatous hypertrophy.  Patient Profile     68 y.o. female with a hx of CKD (Cr 3.0), mild HF LV EF 40-45%, active cocaine use, CAD s/p CABG 06/2012 who presents with dyspnea.  Cardiology is consulted for elevated troponin  Assessment & Plan    1. NSTEMI: -Pt reported getting into an argument with her husband and began to develop dyspnea>>no chest pain. She also reported using cocaine at approximately 3pm. It is unclear as to whether her dyspnea started before or after the cocaine use -O2 saturation on presentation found to be 88% on RA with reticular opacities on CXR -WBC moderately elevated as well>>internal medicine team placed on empiric antibiotics for possible PNA. Could be because of MI  as well.  -Trop found to be elevated at 0.38 with subsequent value of 6.12 -EKG with sinus tachycardia and T wave inversions in lateral leads, accentuated from prior tracings  -Denies anginal type symptoms  -Last cath 04/2015 with severe 2-V CAD of the LAD and LCx with patent  LIMA to LAD, SVG to OM and SVG to LCx. There was diffuse distal vessel disease involving the LCx branches and LAD/Diag beyond the LIMA insertion with medical therapy recommended -Continue ASA, high intensity statin -Will stop BB in the setting of cocaine use  -On IV hep gtt, continue for 48 hours total -Pt has significant elevation in trop with EKG changes and known CAD however with ongoing cocaine use and renal dysfunction would recommend medical management at this time - non invasive management.  -Echocardiogram with pending results  2. 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 CAD s/p CABG 2013 with known Ischemic cardiomyopathy, EF 45% prior: -s/p CABG x 30 June 8849 complicated by sternal wound infection requiring debridement in Aug 2013 -Cath in May 2016 performed after episode of chest  pain that showed patent grafts>>>thought to be secondary to GI bleed/anemia -EF found to be 45% -More current echo to be performed today  4. Chronic combined systolic and diastolic CHF: -LVEF 27-74% by echo Jan 2017, 40-45% April 2017 -BNP elevated on presentation  -Home medications include PO Lasix and metolazone   5. Ongoing cocaine use/tobacco use: -Pt reports last using cocaine at approximately 3pm on day pf presentation  -Would recommended obtaining UDS  -Cessation encouraged  -Nicotine patch in place  -High mortality risk  6. Hx of anemia: -Hx of significant anemia requiring transfusion and GI workup>>>GI work showed one non bleeding AVM and iron therapy was recommended -Hb 11.0 today    Signed, Kathyrn Drown NP-C [place] Pager: [phone] 04/28/2019, 10:03 AM     For questions or updates, please contact   Please consult [email or web address] for contact info under Cardiology/STEMI.  Personally seen and examined. Agree with above. There was some additions made to noted above.    Non-ST elevation myocardial infarction Cocaine use Nonsustained VT CAD post CABG Ischemic cardiomyopathy Morbid obesity Tobacco use Chronic kidney disease stage IV  Overall she is resting comfortably currently.  No chest pain.  Explained to her that she did have another heart attack.  Stopped her beta-blocker because of cocaine admission.  Explained to her the risks of cocaine including death.  Continue with aggressive medical management.  Noninvasive, harm outweighs benefit.  Candee Furbish, MD

## 2019-04-28 NOTE — Progress Notes (Signed)
PROGRESS NOTE  [name] [name] IWP:[id number] DOB: [birth year] DOA: 04/27/2019 PCP: [facility]  HPI/Recap of past 24 hours:  She is sitting up at the edge of the bed, denies chest pain, denies sob currently on 2liter o2 supplement, no hypoxia, she is not home o2 dependent  She reports chronic bilateral lower extremity edema for which she takes lasix  Assessment/Plan: Principal Problem:   Acute on chronic respiratory failure with hypoxia (HCC) Active Problems:   Hyperlipidemia   History of CVA (cerebrovascular accident)   NSTEMI (non-ST elevated myocardial infarction) ([place])   History of cocaine abuse ([place])   Essential hypertension   Tobacco abuse   S/P CABG (coronary artery bypass graft)   Type 2 diabetes mellitus with renal manifestations, controlled ([place])   Acute on chronic combined systolic and diastolic congestive heart failure (HCC)   CKD (chronic kidney disease) stage 4, GFR 15-29 ml/min (HCC)   CAD in native artery   Substance abuse ([place])   Iron deficiency anemia   Elevated troponin   Lobar pneumonia (HCC)   Sepsis (HCC)   Hyperkalemia   NSTEMI She presented to the hospital due to c/o sob Troponin peaked at 12 in the setting of cocaine use H/o CAD s/p CABG ekg Echo Currently Heparin drip, on asa, statin, avoid betablocker in the setting of cocaine use Will follow Cardiology recommendation   Acute on chronic combined chf She does has Bilateral lower extremity edema Repeat echo is pending, lower extremity venous doppler is pending Lasix held on admission due to concerning for sepsis consider resume lasix tomorrow , awaiting for echocardiogram Cardiology onboard  PNA? No fever, does has luekocytosis, lactic acidosis on presentation CXR "Reticular pattern of opacity, potentially atypical pneumonia, bronchitis, edema, and/or developing interstitial fibrosis." -covid 19 negative, rapid respiratory viral panel negative, blood culture no  growth -ua pending collection, urine strep pneumo antigen pending collection -she does not appear septic, will d/c iv abx, change to oral doxycycline  Elevated ddimer: She is getting venous doppler  V/Q scan ordered on admission as well She is on heparin drip  AKI on CKD IV vs progressive CKD -cr was around 2.5 in 2017 -cr currently around 3 -renal dosing meds  Insulin dependent dm2 Hyperglycemia on presentation, blood glucose 290 on presentation a1c 6.6%   Morbid obesity Body mass index is 43.85 kg/m.  Cocaine Cessation education provided  Code Status: full  Family Communication: patient   Disposition Plan: home in 2-3 days with cardiology clearance   Consultants:  Cardiology   Procedures:  none  Antibiotics:  As above    Objective: BP 135/79 (BP Location: Left Arm)   Pulse 80   Temp 98.3 F (36.8 C) (Oral)   Resp 20   Ht 5\' 8"  (1.727 m)   Wt 130.8 kg   SpO2 100%   BMI 43.85 kg/m   Intake/Output Summary (Last 24 hours) at 04/28/2019 5053 Last data filed at 04/28/2019 0400 Gross per 24 hour  Intake 160.13 ml  Output 0 ml  Net 160.13 ml   Filed Weights   04/28/19 0156 04/28/19 0600  Weight: 131.5 kg 130.8 kg    Exam: Patient is examined daily including today on 04/28/2019, exams remain the same as of yesterday except that has changed    General:  NAD  Cardiovascular: RRR  Respiratory: CTABL  Abdomen: Soft/ND/NT, positive BS  Musculoskeletal: bilateral pitting  Edema  Neuro: alert, oriented   Data Reviewed: Basic Metabolic Panel: Recent Labs  Lab 04/27/19 1938  NA 142  K 5.5*  CL 113*  CO2 15*  GLUCOSE 290*  BUN 38*  CREATININE 3.15*  CALCIUM 9.0   Liver Function Tests: Recent Labs  Lab 04/27/19 1938  AST 29  ALT 16  ALKPHOS 161*  BILITOT 0.4  PROT 7.2  ALBUMIN 3.6   No results for input(s): LIPASE, AMYLASE in the last 168 hours. No results for input(s): AMMONIA in the last 168 hours. CBC: Recent Labs  Lab  04/27/19 1938  WBC 18.7*  NEUTROABS 16.8*  HGB 11.0*  HCT 38.1  MCV 84.1  PLT 308   Cardiac Enzymes:   Recent Labs  Lab 04/27/19 1938 04/28/19 0026  TROPONINI 0.38* 6.12*   BNP (last 3 results) Recent Labs    04/27/19 1942  BNP 619.8*    ProBNP (last 3 results) No results for input(s): PROBNP in the last 8760 hours.  CBG: Recent Labs  Lab 04/28/19 0213 04/28/19 0606  GLUCAP 197* 95    Recent Results (from the past 240 hour(s))  Blood Culture (routine x 2)     Status: None (Preliminary result)   Collection Time: 04/27/19  9:15 PM  Result Value Ref Range Status   Specimen Description BLOOD RIGHT ANTECUBITAL  Final   Special Requests   Final    BOTTLES DRAWN AEROBIC AND ANAEROBIC Blood Culture results may not be optimal due to an inadequate volume of blood received in culture bottles   Culture   Final    NO GROWTH < 12 HOURS Performed at [facility], [place] [address] [place], [place] [address]    Report Status PENDING  Incomplete  Blood Culture (routine x 2)     Status: None (Preliminary result)   Collection Time: 04/27/19  9:27 PM  Result Value Ref Range Status   Specimen Description BLOOD RIGHT HAND  Final   Special Requests   Final    BOTTLES DRAWN AEROBIC AND ANAEROBIC Blood Culture results may not be optimal due to an inadequate volume of blood received in culture bottles   Culture   Final    NO GROWTH < 12 HOURS Performed at [facility], [place] [address], [place], [place] [address]    Report Status PENDING  Incomplete  SARS Coronavirus 2 [facility] order, Performed in [place] hospital lab)     Status: None   Collection Time: 04/27/19 10:10 PM  Result Value Ref Range Status   SARS Coronavirus 2 NEGATIVE NEGATIVE Final    Comment: (NOTE) If result is NEGATIVE SARS-CoV-2 target nucleic acids are NOT DETECTED. The SARS-CoV-2 RNA is generally detectable in upper and lower  respiratory specimens during the acute phase of infection. The  lowest  concentration of SARS-CoV-2 viral copies this assay can detect is 250  copies / mL. A negative result does not preclude SARS-CoV-2 infection  and should not be used as the sole basis for treatment or other  patient management decisions.  A negative result may occur with  improper specimen collection / handling, submission of specimen other  than nasopharyngeal swab, presence of viral mutation(s) within the  areas targeted by this assay, and inadequate number of viral copies  (<250 copies / mL). A negative result must be combined with clinical  observations, patient history, and epidemiological information. If result is POSITIVE SARS-CoV-2 target nucleic acids are DETECTED. The SARS-CoV-2 RNA is generally detectable in upper and lower  respiratory specimens dur ing the acute phase of infection.  Positive  results are indicative of active infection with SARS-CoV-2.  Clinical  correlation with patient history and other diagnostic information is  necessary to determine patient infection status.  Positive results do  not rule out bacterial infection or co-infection with other viruses. If result is PRESUMPTIVE POSTIVE SARS-CoV-2 nucleic acids MAY BE PRESENT.   A presumptive positive result was obtained on the submitted specimen  and confirmed on repeat testing.  While 2019 novel coronavirus  (SARS-CoV-2) nucleic acids may be present in the submitted sample  additional confirmatory testing may be necessary for epidemiological  and / or clinical management purposes  to differentiate between  SARS-CoV-2 and other Sarbecovirus currently known to infect humans.  If clinically indicated additional testing with an alternate test  methodology [id number]) is advised. The SARS-CoV-2 RNA is generally  detectable in upper and lower respiratory sp ecimens during the acute  phase of infection. The expected result is Negative. Fact Sheet for Patients:  StrictlyIdeas.no  Fact Sheet for Healthcare Providers: BankingDealers.co.za This test is not yet approved or cleared by the [place] FDA and has been authorized for detection and/or diagnosis of SARS-CoV-2 by FDA under an Emergency Use Authorization (EUA).  This EUA will remain in effect (meaning this test can be used) for the duration of the COVID-19 declaration under Section 564(b)(1) of the Act, 21 U.S.C. section 360bbb-3(b)(1), unless the authorization is terminated or revoked sooner. Performed at [facility], [place] [address] [place], [place] [address]   Respiratory Panel by PCR     Status: None   Collection Time: 04/27/19 10:10 PM  Result Value Ref Range Status   Adenovirus NOT DETECTED NOT DETECTED Final   Coronavirus 229E NOT DETECTED NOT DETECTED Final    Comment: (NOTE) The Coronavirus on the Respiratory Panel, DOES NOT test for the novel  Coronavirus (2019 nCoV)    Coronavirus HKU1 NOT DETECTED NOT DETECTED Final   Coronavirus NL63 NOT DETECTED NOT DETECTED Final   Coronavirus OC43 NOT DETECTED NOT DETECTED Final   Metapneumovirus NOT DETECTED NOT DETECTED Final   Rhinovirus / Enterovirus NOT DETECTED NOT DETECTED Final   Influenza A NOT DETECTED NOT DETECTED Final   Influenza B NOT DETECTED NOT DETECTED Final   Parainfluenza Virus 1 NOT DETECTED NOT DETECTED Final   Parainfluenza Virus 2 NOT DETECTED NOT DETECTED Final   Parainfluenza Virus 3 NOT DETECTED NOT DETECTED Final   Parainfluenza Virus 4 NOT DETECTED NOT DETECTED Final   Respiratory Syncytial Virus NOT DETECTED NOT DETECTED Final   Bordetella pertussis NOT DETECTED NOT DETECTED Final   Chlamydophila pneumoniae NOT DETECTED NOT DETECTED Final   Mycoplasma pneumoniae NOT DETECTED NOT DETECTED Final    Comment: Performed at [facility], [place]. [address] [place], [place] [address]     Studies: Dg Chest Flippin 1 View  Result Date: 04/27/2019 CLINICAL DATA:  68 year old female with a  history shortness of breath EXAM: PORTABLE CHEST 1 VIEW COMPARISON:  09/18/2016 FINDINGS: Cardiomediastinal silhouette unchanged in size and contour. Surgical changes of median sternotomy and CABG. Coarsened interstitial markings throughout, with reticular pattern of opacity more pronounced than the prior. No pneumothorax or pleural effusion. No confluent airspace disease. IMPRESSION: Reticular pattern of opacity, potentially atypical pneumonia, bronchitis, edema, and/or developing interstitial fibrosis. Electronically Signed   By: Corrie Mckusick D.O.   On: 04/27/2019 19:54    Scheduled Meds: . allopurinol  100 mg Oral Daily  . aspirin EC  81 mg Oral Daily  . atorvastatin  80  mg Oral q1800  . brinzolamide  1 drop Both Eyes TID  . buPROPion  150 mg Oral BID  . gabapentin  300 mg Oral TID  . insulin aspart  0-9 Units Subcutaneous TID WC  . insulin glargine  15 Units Subcutaneous Q2200  . ipratropium  2 puff Inhalation Q4H  . latanoprost  1 drop Both Eyes QHS  . linaclotide  72 mcg Oral QAC breakfast  . loratadine  10 mg Oral Daily  . mouth rinse  15 mL Mouth Rinse BID  . metoprolol tartrate  25 mg Oral BID  . mometasone-formoterol  2 puff Inhalation BID  . nicotine  21 mg Transdermal Daily  . pantoprazole  40 mg Oral Daily  . [START ON 04/29/2019] pneumococcal 23 valent vaccine  0.5 mL Intramuscular Tomorrow-1000    Continuous Infusions: . sodium chloride 250 mL (04/28/19 0651)  . cefTRIAXone (ROCEPHIN)  IV    . doxycycline (VIBRAMYCIN) IV 100 mg (04/28/19 4193)  . heparin 1,400 Units/hr (04/28/19 7902)     Time spent: 58mins, case discussed with cardiology I have personally reviewed and interpreted on  04/28/2019 daily labs, tele strips, imagings as discussed above under date review session and assessment and plans.  I reviewed all nursing notes, pharmacy notes, consultant notes,  vitals, pertinent old records  I have discussed plan of care as described above with RN , patient  on  04/28/2019   [name] MD, PhD  [facility] Pager [phone]. If 7PM-7AM, please contact night-coverage at [email or web address], password [facility] 04/28/2019, 7:21 AM  LOS: 0 days

## 2019-04-28 NOTE — Progress Notes (Signed)
  Echocardiogram 2D Echocardiogram has been performed.  [name] [name] 04/28/2019, 3:12 PM
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
## 2019-04-28 NOTE — Progress Notes (Signed)
Critical troponin of 6.12 from 0.38.  On call triad notified.

## 2019-04-28 NOTE — Progress Notes (Signed)
Received patient's lantus at 0400, scheduled at 2200. RN held patient's lantus, patient also NPO.

## 2019-04-28 NOTE — Progress Notes (Signed)
CRITICAL VALUE ALERT  Critical Value:  Troponin 12.18   Date & Time Notied: 04/28/2019  @ 10:30 Provider Notified: MD is aware

## 2019-04-28 NOTE — Progress Notes (Signed)
Bilateral lower extremity venous duplex completed. Refer to "CV Proc" under chart review to view preliminary results.  04/28/2019 4:41 PM Maudry Mayhew, MHA, RVT, RDCS, RDMS

## 2019-04-28 NOTE — Progress Notes (Signed)
   04/28/19 0144  Vitals  Temp 97.6 F (36.4 C)  Temp Source Oral  BP (!) 161/82  MAP (mmHg) 108  BP Location Right Arm  BP Method Automatic  Patient Position (if appropriate) Lying  Pulse Rate 83  Pulse Rate Source Monitor  Resp 20  Oxygen Therapy  SpO2 98 %  O2 Device Nasal Cannula  MEWS Score  MEWS RR 0  MEWS Pulse 0  MEWS Systolic 0  MEWS LOC 0  MEWS Temp 0  MEWS Score 0  MEWS Score Color Green  Patient arrived to unit.  Patient did not feel she was able to stand for standing weight at this time.  Will retry standing weight in the morning.  No complaints of pain.  Patient given call bell, bed alarm on, and placed on telemetry.  Patient currently NPO at this time.

## 2019-04-28 NOTE — Progress Notes (Signed)
[place] for heparin Indication: chest pain/ACS  Allergies  Allergen Reactions  . Naproxen Rash    Patient Measurements: Height: 5\' 8"  (172.7 cm) Weight: 288 lb 6.4 oz (130.8 kg) IBW/kg (Calculated) : 63.9 Heparin Dosing Weight: last weight 272 lb  Vital Signs: Temp: 97.9 F (36.6 C) (04/30 1118) Temp Source: Oral (04/30 1118) BP: 133/87 (04/30 1118) Pulse Rate: 71 (04/30 1118)  Labs: Recent Labs    04/27/19 1938 04/28/19 0026 04/28/19 0547 04/28/19 0749 04/28/19 1120 04/28/19 1556  HGB 11.0*  --   --   --   --  9.4*  HCT 38.1  --   --   --   --  31.5*  PLT 308  --   --   --   --  232  APTT  --  28  --   --   --   --   LABPROT  --  14.8  --   --   --   --   INR  --  1.2  --   --   --   --   HEPARINUNFRC  --   --   --  0.39  --  0.46  CREATININE 3.15*  --   --  2.99*  --   --   TROPONINI 0.38* 6.12* 12.18*  --  11.11*  --     Estimated Creatinine Clearance: 25.8 mL/min (A) (by C-G formula based on SCr of 2.99 mg/dL (H)).   Medical History: Past Medical History:  Diagnosis Date  . Anemia 08/2016  . Angiodysplasia of colon   . Arthritis of left shoulder region 03/23/2013  . Chronic combined systolic and diastolic CHF (congestive heart failure) (HCC)    a. EF 40-45%, mild LVH, mid apicalanteroseptal and apical HK.  . CKD (chronic kidney disease), stage III ([place])   . Cocaine abuse ([place])    crack cocaine heavily until 2008 then sporadic use since then  . Coronary artery disease    a. 06/2012 NSTEMI/CABG x 3 (LIMA->LAD, VG->OM2, VG->LCX);  b. 04/2015 MV: EF<30%, mid ant, apicalanterior, apical infarct;  c. 04/2015 Cath: LM nl, LAD 90p, LCX 20m, OM1 min irregs, RCA mild dzs, LIMA->LAD nl w/ dist LAD dzs, VG->OM2 nl, VG->LCX nl-->Med Rx.  . CVA (cerebral infarction)    a. right internal capsule stroke in 12/2006  . Diabetes mellitus    diagnosed in 2008  . Essential hypertension   . Glaucoma   . Gout   . HFrEF (heart failure  with reduced ejection fraction) ([place])   . Hyperlipidemia   . Hyperparathyroidism, secondary renal ([place])   . Left-sided sensory deficit present   . Obesity, morbid ([place])   . PVD (peripheral vascular disease) ([place])    a. 06/2012 ABI's: R - 0.73, L - 0.71.  Marland Kitchen Renal mass, right   . Shortness of breath dyspnea   . Stroke ([place])   . Thyroid nodule    FNA in 7017 showed follicular cells but not definate neoplasm  . Tobacco abuse   . Trichomoniasis     Assessment: 68 yo lady with elevated troponin to start heparin.  She was not on anticoagulation PTA.  Baseline Hg 11.0, PTLC 308 -heparin level has been confirmed in goal    Goal of Therapy:  Heparin level 0.3-0.7 units/ml Monitor platelets by anticoagulation protocol: Yes   Plan: Continue IV heparin at current rate. Daily heparin level and CBC.  Hildred Laser, PharmD Clinical Pharmacist **Pharmacist phone  directory can now be found on [email or web address] ([email or web address]).  Listed under [place].

## 2019-04-28 NOTE — Progress Notes (Signed)
[facility] for heparin Indication: chest pain/ACS  Allergies  Allergen Reactions  . Naproxen Rash    Patient Measurements: Height: 5\' 8"  (172.7 cm) Weight: 288 lb 6.4 oz (130.8 kg) IBW/kg (Calculated) : 63.9 Heparin Dosing Weight: last weight 272 lb  Vital Signs: Temp: 98.3 F (36.8 C) (04/30 0355) Temp Source: Oral (04/30 0355) BP: 143/93 (04/30 0812) Pulse Rate: 72 (04/30 0812)  Labs: Recent Labs    04/27/19 1938 04/28/19 0026 04/28/19 0547 04/28/19 0749  HGB 11.0*  --   --   --   HCT 38.1  --   --   --   PLT 308  --   --   --   APTT  --  28  --   --   LABPROT  --  14.8  --   --   INR  --  1.2  --   --   HEPARINUNFRC  --   --   --  0.39  CREATININE 3.15*  --   --  2.99*  TROPONINI 0.38* 6.12* 12.18*  --     Estimated Creatinine Clearance: 25.8 mL/min (A) (by C-G formula based on SCr of 2.99 mg/dL (H)).   Medical History: Past Medical History:  Diagnosis Date  . Anemia 08/2016  . Angiodysplasia of colon   . Arthritis of left shoulder region 03/23/2013  . Chronic combined systolic and diastolic CHF (congestive heart failure) (HCC)    a. EF 40-45%, mild LVH, mid apicalanteroseptal and apical HK.  . CKD (chronic kidney disease), stage III ([place])   . Cocaine abuse ([place])    crack cocaine heavily until 2008 then sporadic use since then  . Coronary artery disease    a. 06/2012 NSTEMI/CABG x 3 (LIMA->LAD, VG->OM2, VG->LCX);  b. 04/2015 MV: EF<30%, mid ant, apicalanterior, apical infarct;  c. 04/2015 Cath: LM nl, LAD 90p, LCX 99m, OM1 min irregs, RCA mild dzs, LIMA->LAD nl w/ dist LAD dzs, VG->OM2 nl, VG->LCX nl-->Med Rx.  . CVA (cerebral infarction)    a. right internal capsule stroke in 12/2006  . Diabetes mellitus    diagnosed in 2008  . Essential hypertension   . Glaucoma   . Gout   . HFrEF (heart failure with reduced ejection fraction) ([place])   . Hyperlipidemia   . Hyperparathyroidism, secondary renal ([place])   . Left-sided  sensory deficit present   . Obesity, morbid ([place])   . PVD (peripheral vascular disease) ([place])    a. 06/2012 ABI's: R - 0.73, L - 0.71.  Marland Kitchen Renal mass, right   . Shortness of breath dyspnea   . Stroke ([place])   . Thyroid nodule    FNA in 2836 showed follicular cells but not definate neoplasm  . Tobacco abuse   . Trichomoniasis     Assessment: 68 yo lady with elevated troponin to start heparin.  She was not on anticoagulation PTA.  Baseline Hg 11.0, PTLC 308  Initial heparin level within goal range.  No overt bleeding or complications noted.  No CBC today, but WNL last night.  Goal of Therapy:  Heparin level 0.3-0.7 units/ml Monitor platelets by anticoagulation protocol: Yes   Plan:  Continue IV heparin at current rate. Confirm heparin level in 8 hrs. Daily heparin level and CBC.  Marguerite Olea, [facility] Clinical Pharmacist Phone [phone]  04/28/2019 9:59 AM

## 2019-04-28 NOTE — ED Notes (Signed)
Report given to rn on 3e ashley

## 2019-04-29 ENCOUNTER — Inpatient Hospital Stay (HOSPITAL_COMMUNITY): Payer: Medicare Other

## 2019-04-29 DIAGNOSIS — Z951 Presence of aortocoronary bypass graft: Secondary | ICD-10-CM

## 2019-04-29 DIAGNOSIS — E119 Type 2 diabetes mellitus without complications: Secondary | ICD-10-CM

## 2019-04-29 DIAGNOSIS — Z794 Long term (current) use of insulin: Secondary | ICD-10-CM

## 2019-04-29 DIAGNOSIS — N184 Chronic kidney disease, stage 4 (severe): Secondary | ICD-10-CM

## 2019-04-29 DIAGNOSIS — N179 Acute kidney failure, unspecified: Secondary | ICD-10-CM

## 2019-04-29 DIAGNOSIS — I5043 Acute on chronic combined systolic (congestive) and diastolic (congestive) heart failure: Secondary | ICD-10-CM

## 2019-04-29 DIAGNOSIS — I214 Non-ST elevation (NSTEMI) myocardial infarction: Principal | ICD-10-CM

## 2019-04-29 LAB — BASIC METABOLIC PANEL
Anion gap: 11 (ref 5–15)
BUN: 44 mg/dL — ABNORMAL HIGH (ref 8–23)
CO2: 17 mmol/L — ABNORMAL LOW (ref 22–32)
Calcium: 8.3 mg/dL — ABNORMAL LOW (ref 8.9–10.3)
Chloride: 114 mmol/L — ABNORMAL HIGH (ref 98–111)
Creatinine, Ser: 3.49 mg/dL — ABNORMAL HIGH (ref 0.44–1.00)
GFR calc Af Amer: 15 mL/min — ABNORMAL LOW (ref >60)
GFR calc non Af Amer: 13 mL/min — ABNORMAL LOW (ref >60)
Glucose, Bld: 152 mg/dL — ABNORMAL HIGH (ref 70–99)
Potassium: 4.5 mmol/L (ref 3.5–5.1)
Sodium: 142 mmol/L (ref 135–145)

## 2019-04-29 LAB — CBC
HCT: 30 % — ABNORMAL LOW (ref 36.0–46.0)
Hemoglobin: 8.7 g/dL — ABNORMAL LOW (ref 12.0–15.0)
MCH: 24.2 pg — ABNORMAL LOW (ref 26.0–34.0)
MCHC: 29 g/dL — ABNORMAL LOW (ref 30.0–36.0)
MCV: 83.3 fL (ref 80.0–100.0)
Platelets: 200 10*3/uL (ref 150–400)
RBC: 3.6 MIL/uL — ABNORMAL LOW (ref 3.87–5.11)
RDW: 18.3 % — ABNORMAL HIGH (ref 11.5–15.5)
WBC: 7.7 10*3/uL (ref 4.0–10.5)
nRBC: 0 % (ref 0.0–0.2)

## 2019-04-29 LAB — GLUCOSE, CAPILLARY
Glucose-Capillary: 143 mg/dL — ABNORMAL HIGH (ref 70–99)
Glucose-Capillary: 136 mg/dL — ABNORMAL HIGH (ref 70–99)
Glucose-Capillary: 192 mg/dL — ABNORMAL HIGH (ref 70–99)
Glucose-Capillary: 145 mg/dL — ABNORMAL HIGH (ref 70–99)

## 2019-04-29 LAB — HEPARIN LEVEL (UNFRACTIONATED)
Heparin Unfractionated: 0.77 IU/mL — ABNORMAL HIGH (ref 0.30–0.70)
Heparin Unfractionated: 0.28 IU/mL — ABNORMAL LOW (ref 0.30–0.70)

## 2019-04-29 LAB — LEGIONELLA PNEUMOPHILA SEROGP 1 UR AG: L. pneumophila Serogp 1 Ur Ag: NEGATIVE

## 2019-04-29 MED ORDER — IPRATROPIUM BROMIDE HFA 17 MCG/ACT IN AERS
2.0000 | INHALATION_SPRAY | Freq: Three times a day (TID) | RESPIRATORY_TRACT | Status: DC
  Administered 2019-04-30 (×2): 2 via RESPIRATORY_TRACT
  Filled 2019-04-29: qty 12.9

## 2019-04-29 MED ORDER — GUAIFENESIN ER 600 MG PO TB12
600.0000 mg | ORAL_TABLET | Freq: Two times a day (BID) | ORAL | Status: DC
  Administered 2019-04-29 – 2019-04-30 (×2): 600 mg via ORAL
  Filled 2019-04-29 (×2): qty 1

## 2019-04-29 MED ORDER — GABAPENTIN 300 MG PO CAPS
300.0000 mg | ORAL_CAPSULE | Freq: Two times a day (BID) | ORAL | Status: DC
  Administered 2019-04-29 – 2019-04-30 (×2): 300 mg via ORAL
  Filled 2019-04-29 (×2): qty 1

## 2019-04-29 MED ORDER — FLUTICASONE PROPIONATE 50 MCG/ACT NA SUSP
2.0000 | Freq: Every day | NASAL | Status: DC
  Administered 2019-04-29 – 2019-04-30 (×2): 2 via NASAL
  Filled 2019-04-29 (×2): qty 16

## 2019-04-29 NOTE — Progress Notes (Addendum)
[facility] for heparin Indication: chest pain/ACS  Allergies  Allergen Reactions  . Naproxen Rash    Patient Measurements: Height: 5\' 8"  (172.7 cm) Weight: 293 lb 4.8 oz (133 kg)(A scale) IBW/kg (Calculated) : 63.9 Heparin Dosing Weight: last weight 272 lb  Vital Signs: Temp: 99.1 F (37.3 C) (05/01 0355) Temp Source: Oral (05/01 0355) BP: 151/71 (05/01 0355) Pulse Rate: 79 (05/01 0355)  Labs: Recent Labs    04/27/19 1938 04/28/19 0026 04/28/19 0547 04/28/19 0749 04/28/19 1120 04/28/19 1556 04/29/19 0346  HGB 11.0*  --   --   --   --  9.4* 8.7*  HCT 38.1  --   --   --   --  31.5* 30.0*  PLT 308  --   --   --   --  232 200  APTT  --  28  --   --   --   --   --   LABPROT  --  14.8  --   --   --   --   --   INR  --  1.2  --   --   --   --   --   HEPARINUNFRC  --   --   --  0.39  --  0.46 0.77*  CREATININE 3.15*  --   --  2.99*  --   --   --   TROPONINI 0.38* 6.12* 12.18*  --  11.11*  --   --     Estimated Creatinine Clearance: 26 mL/min (A) (by C-G formula based on SCr of 2.99 mg/dL (H)).  Assessment: 68 yo lady with elevated troponin to start heparin.  She was not on anticoagulation PTA.  Baseline Hg 11.0, PTLC 308 -heparin level this am 0.77 units/ml  Hg down to 8.7.  No bleeding reported per RN  Goal of Therapy:  Heparin level 0.3-0.7 units/ml Monitor platelets by anticoagulation protocol: Yes   Plan: Decrease heparin to 1300 units/hr Check heparin level ~ 6 hours after rate change Daily heparin level and CBC.  Thanks for allowing pharmacy to be a part of this patient's care.  Excell Seltzer, PharmD Clinical Pharmacist

## 2019-04-29 NOTE — Plan of Care (Signed)

## 2019-04-29 NOTE — Progress Notes (Signed)
PROGRESS NOTE  [name] [name] NID:[id number] DOB: [birth year] DOA: 04/27/2019 PCP: [facility]  HPI/Recap of past 24 hours:  Feeling better, denies chest pain,  Still has mild sob when walking, no hypoxia documented, cough has almost resolved Edema appear better today  She remains on heparin drip  Assessment/Plan: Principal Problem:   Acute on chronic respiratory failure with hypoxia (HCC) Active Problems:   Hyperlipidemia   History of CVA (cerebrovascular accident)   NSTEMI (non-ST elevated myocardial infarction) ([place])   History of cocaine abuse ([place])   Essential hypertension   Tobacco abuse   S/P CABG (coronary artery bypass graft)   Type 2 diabetes mellitus with renal manifestations, controlled ([place])   Acute on chronic combined systolic and diastolic congestive heart failure (HCC)   Obesity, Class III, BMI 40-49.9 (morbid obesity) (HCC)   CKD (chronic kidney disease) stage 4, GFR 15-29 ml/min (HCC)   CAD in native artery   Substance abuse (HCC)   Iron deficiency anemia   Elevated troponin   Lobar pneumonia (HCC)   Sepsis (HCC)   Hyperkalemia   NSTEMI She presented to the hospital due to c/o sob Troponin peaked at 12 in the setting of cocaine use H/o CAD s/p CABG ekg Echo Currently Heparin drip, on asa, statin, avoid betablocker in the setting of cocaine use Will follow Cardiology recommendation, per cardiology continue heparin drip for another 24hrs, then possible d/c tomorrow   Chronic combined chf She does has Bilateral lower extremity edema on presentation, today is better with holding lasix Repeat echo is pending, - lower extremity venous doppler negative for DVT, "Bilateral lower extremity venous flow is pulsatile, suggestive of elevated right sided heart pressure." Lasix held on admission due to concerning for sepsis Cr worsening today, from cardiorenal? Will defer diuretic management to cardiology Cardiology input appreciated   PNA? No fever, does has luekocytosis, lactic acidosis on presentation CXR "Reticular pattern of opacity, potentially atypical pneumonia, bronchitis, edema, and/or developing interstitial fibrosis." -covid 19 negative, rapid respiratory viral panel negative, blood culture no growth -ua pending collection, urine strep pneumo antigen pending collection -she does not appear septic, will d/c iv abx rocephin and zithro, change to oral doxycycline  Elevated ddimer: venous doppler  Negative for PE V/Q scan low probability    AKI on CKD IV vs progressive CKD -cr was around 2.5 in 2017 -cr appear worsening, ua + proteinuria, will get renal US -renal dosing meds -she reports she has an appointment with nephrology Dr Joelyn Oms on 5/18  Insulin dependent dm2 Hyperglycemia on presentation, blood glucose 290 on presentation a1c 6.6%   Morbid obesity Body mass index is 44.6 kg/m. She is advised to have outpatient sleep study to be arranged by her pcp, she understands  Cocaine She reports couple of girlfriend got stimulus check and had a party Cessation education provided  Code Status: full  Family Communication: patient   Disposition Plan: home in 1-2 days with cardiology clearance   Consultants:  Cardiology   Procedures:  none  Antibiotics:  As above    Objective: BP (!) 159/66 (BP Location: Left Arm)   Pulse 82   Temp 98.4 F (36.9 C) (Oral)   Resp 18   Ht 5\' 8"  (1.727 m)   Wt 133 kg Comment: A scale  SpO2 99%   BMI 44.60 kg/m   Intake/Output Summary (Last 24 hours) at 04/29/2019 1307 Last data filed at 04/29/2019 1259 Gross per 24 hour  Intake 1675.06 ml  Output 900 ml  Net 775.06 ml   Filed Weights   04/28/19 0156 04/28/19 0600 04/29/19 0402  Weight: 131.5 kg 130.8 kg 133 kg    Exam: Patient is examined daily including today on 04/29/2019, exams remain the same as of yesterday except that has changed    General:  NAD  Cardiovascular: RRR  Respiratory:  CTABL  Abdomen: Soft/ND/NT, positive BS  Musculoskeletal: bilateral pitting  Edema  Neuro: alert, oriented   Data Reviewed: Basic Metabolic Panel: Recent Labs  Lab 04/27/19 1938 04/28/19 0749 04/29/19 0346  NA 142 144 142  K 5.5* 4.8 4.5  CL 113* 113* 114*  CO2 15* 22 17*  GLUCOSE 290* 82 152*  BUN 38* 39* 44*  CREATININE 3.15* 2.99* 3.49*  CALCIUM 9.0 8.7* 8.3*   Liver Function Tests: Recent Labs  Lab 04/27/19 1938  AST 29  ALT 16  ALKPHOS 161*  BILITOT 0.4  PROT 7.2  ALBUMIN 3.6   No results for input(s): LIPASE, AMYLASE in the last 168 hours. No results for input(s): AMMONIA in the last 168 hours. CBC: Recent Labs  Lab 04/27/19 1938 04/28/19 1556 04/29/19 0346  WBC 18.7* 10.0 7.7  NEUTROABS 16.8*  --   --   HGB 11.0* 9.4* 8.7*  HCT 38.1 31.5* 30.0*  MCV 84.1 82.9 83.3  PLT 308 232 200   Cardiac Enzymes:   Recent Labs  Lab 04/27/19 1938 04/28/19 0026 04/28/19 0547 04/28/19 1120  TROPONINI 0.38* 6.12* 12.18* 11.11*   BNP (last 3 results) Recent Labs    04/27/19 1942  BNP 619.8*    ProBNP (last 3 results) No results for input(s): PROBNP in the last 8760 hours.  CBG: Recent Labs  Lab 04/28/19 1112 04/28/19 1648 04/28/19 2044 04/29/19 0546 04/29/19 1123  GLUCAP 83 152* 130* 143* 136*    Recent Results (from the past 240 hour(s))  Blood Culture (routine x 2)     Status: None (Preliminary result)   Collection Time: 04/27/19  9:15 PM  Result Value Ref Range Status   Specimen Description BLOOD RIGHT ANTECUBITAL  Final   Special Requests   Final    BOTTLES DRAWN AEROBIC AND ANAEROBIC Blood Culture results may not be optimal due to an inadequate volume of blood received in culture bottles   Culture   Final    NO GROWTH < 12 HOURS Performed at [facility] [address] [place], [place] [address]    Report Status PENDING  Incomplete  Blood Culture (routine x 2)     Status: None (Preliminary result)   Collection Time:  04/27/19  9:27 PM  Result Value Ref Range Status   Specimen Description BLOOD RIGHT HAND  Final   Special Requests   Final    BOTTLES DRAWN AEROBIC AND ANAEROBIC Blood Culture results may not be optimal due to an inadequate volume of blood received in culture bottles   Culture   Final    NO GROWTH < 12 HOURS Performed at [facility], [place] [address], [place], [place] [address]    Report Status PENDING  Incomplete  SARS Coronavirus 2 [facility] order, Performed in [place] hospital lab)     Status: None   Collection Time: 04/27/19 10:10 PM  Result Value Ref Range Status   SARS Coronavirus 2 NEGATIVE NEGATIVE Final    Comment: (NOTE) If result is NEGATIVE SARS-CoV-2 target nucleic acids are NOT DETECTED. The SARS-CoV-2 RNA is generally detectable in upper and lower  respiratory  specimens during the acute phase of infection. The lowest  concentration of SARS-CoV-2 viral copies this assay can detect is 250  copies / mL. A negative result does not preclude SARS-CoV-2 infection  and should not be used as the sole basis for treatment or other  patient management decisions.  A negative result may occur with  improper specimen collection / handling, submission of specimen other  than nasopharyngeal swab, presence of viral mutation(s) within the  areas targeted by this assay, and inadequate number of viral copies  (<250 copies / mL). A negative result must be combined with clinical  observations, patient history, and epidemiological information. If result is POSITIVE SARS-CoV-2 target nucleic acids are DETECTED. The SARS-CoV-2 RNA is generally detectable in upper and lower  respiratory specimens dur ing the acute phase of infection.  Positive  results are indicative of active infection with SARS-CoV-2.  Clinical  correlation with patient history and other diagnostic information is  necessary to determine patient infection status.  Positive results do  not rule out bacterial  infection or co-infection with other viruses. If result is PRESUMPTIVE POSTIVE SARS-CoV-2 nucleic acids MAY BE PRESENT.   A presumptive positive result was obtained on the submitted specimen  and confirmed on repeat testing.  While 2019 novel coronavirus  (SARS-CoV-2) nucleic acids may be present in the submitted sample  additional confirmatory testing may be necessary for epidemiological  and / or clinical management purposes  to differentiate between  SARS-CoV-2 and other Sarbecovirus currently known to infect humans.  If clinically indicated additional testing with an alternate test  methodology [phone]) is advised. The SARS-CoV-2 RNA is generally  detectable in upper and lower respiratory sp ecimens during the acute  phase of infection. The expected result is Negative. Fact Sheet for Patients:  StrictlyIdeas.no Fact Sheet for Healthcare Providers: BankingDealers.co.za This test is not yet approved or cleared by the [place] FDA and has been authorized for detection and/or diagnosis of SARS-CoV-2 by FDA under an Emergency Use Authorization (EUA).  This EUA will remain in effect (meaning this test can be used) for the duration of the COVID-19 declaration under Section 564(b)(1) of the Act, 21 U.S.C. section 360bbb-3(b)(1), unless the authorization is terminated or revoked sooner. Performed at [facility], [place] [address] [place], [place] [address]   Respiratory Panel by PCR     Status: None   Collection Time: 04/27/19 10:10 PM  Result Value Ref Range Status   Adenovirus NOT DETECTED NOT DETECTED Final   Coronavirus 229E NOT DETECTED NOT DETECTED Final    Comment: (NOTE) The Coronavirus on the Respiratory Panel, DOES NOT test for the novel  Coronavirus (2019 nCoV)    Coronavirus HKU1 NOT DETECTED NOT DETECTED Final   Coronavirus NL63 NOT DETECTED NOT DETECTED Final   Coronavirus OC43 NOT DETECTED NOT DETECTED Final    Metapneumovirus NOT DETECTED NOT DETECTED Final   Rhinovirus / Enterovirus NOT DETECTED NOT DETECTED Final   Influenza A NOT DETECTED NOT DETECTED Final   Influenza B NOT DETECTED NOT DETECTED Final   Parainfluenza Virus 1 NOT DETECTED NOT DETECTED Final   Parainfluenza Virus 2 NOT DETECTED NOT DETECTED Final   Parainfluenza Virus 3 NOT DETECTED NOT DETECTED Final   Parainfluenza Virus 4 NOT DETECTED NOT DETECTED Final   Respiratory Syncytial Virus NOT DETECTED NOT DETECTED Final   Bordetella pertussis NOT DETECTED NOT DETECTED Final   Chlamydophila pneumoniae NOT DETECTED NOT DETECTED Final   Mycoplasma pneumoniae NOT DETECTED NOT DETECTED  Final    Comment: Performed at [facility], [place] [address], [place], [place] [address]     Studies: Vas [place] Lower Extremity Venous (dvt)  Result Date: 04/28/2019  Lower Venous Study Indications: Swelling.  Risk Factors: Obesity and elevated d-dimer=4.21, CHF, cocaine abuse. Performing Technologist: Maudry Mayhew MHA, RDMS, RVT, RDCS  Examination Guidelines: A complete evaluation includes B-mode imaging, spectral Doppler, color Doppler, and power Doppler as needed of all accessible portions of each vessel. Bilateral testing is considered an integral part of a complete examination. Limited examinations for reoccurring indications may be performed as noted.  +---------+---------------+---------+-----------+----------+--------------+ RIGHT    CompressibilityPhasicitySpontaneityPropertiesSummary        +---------+---------------+---------+-----------+----------+--------------+ CFV      Full           No       Yes                  pulsatile flow +---------+---------------+---------+-----------+----------+--------------+ SFJ      Full                                                        +---------+---------------+---------+-----------+----------+--------------+ FV Prox  Full                                                         +---------+---------------+---------+-----------+----------+--------------+ FV Mid   Full                                                        +---------+---------------+---------+-----------+----------+--------------+ FV DistalFull                                                        +---------+---------------+---------+-----------+----------+--------------+ PFV      Full                                                        +---------+---------------+---------+-----------+----------+--------------+ POP      Full           No       Yes                  pulsatile flow +---------+---------------+---------+-----------+----------+--------------+ PTV                                                   Not visualized +---------+---------------+---------+-----------+----------+--------------+ PERO  Not visualized +---------+---------------+---------+-----------+----------+--------------+   +---------+---------------+---------+-----------+----------+--------------+ LEFT     CompressibilityPhasicitySpontaneityPropertiesSummary        +---------+---------------+---------+-----------+----------+--------------+ CFV      Full           No       No                   pulsatile flow +---------+---------------+---------+-----------+----------+--------------+ SFJ                                                   Not visualized +---------+---------------+---------+-----------+----------+--------------+ FV Prox  Full                                                        +---------+---------------+---------+-----------+----------+--------------+ FV Mid   Full                                                        +---------+---------------+---------+-----------+----------+--------------+ FV DistalFull                                                         +---------+---------------+---------+-----------+----------+--------------+ POP      Full           No       No                   pulsatile flow +---------+---------------+---------+-----------+----------+--------------+ PTV      Full                                                        +---------+---------------+---------+-----------+----------+--------------+ PERO     Full                                                        +---------+---------------+---------+-----------+----------+--------------+     Summary: Right: There is no evidence of deep vein thrombosis in the lower extremity. However, portions of this examination were limited- see technologist comments above. No cystic structure found in the popliteal fossa. Left: There is no evidence of deep vein thrombosis in the lower extremity. However, portions of this examination were limited- see technologist comments above. No cystic structure found in the popliteal fossa.  Bilateral lower extremity venous flow is pulsatile, suggestive of elevated right sided heart pressure. *See table(s) above for measurements and observations. Electronically signed by Monica Martinez MD on 04/28/2019 at 5:19:02 PM.    Final     Scheduled Meds: . allopurinol  100 mg Oral Daily  . aspirin EC  81 mg Oral Daily  .  atorvastatin  80 mg Oral q1800  . brinzolamide  1 drop Both Eyes TID  . buPROPion  150 mg Oral BID  . doxycycline  100 mg Oral Q12H  . gabapentin  300 mg Oral BID  . insulin aspart  0-9 Units Subcutaneous TID WC  . insulin glargine  15 Units Subcutaneous Q2200  . ipratropium  2 puff Inhalation Q4H  . latanoprost  1 drop Both Eyes QHS  . linaclotide  72 mcg Oral QAC breakfast  . loratadine  10 mg Oral Daily  . mouth rinse  15 mL Mouth Rinse BID  . mometasone-formoterol  2 puff Inhalation BID  . nicotine  21 mg Transdermal Daily  . pantoprazole  40 mg Oral Daily  . pneumococcal 23 valent vaccine  0.5 mL Intramuscular  Tomorrow-1000    Continuous Infusions: . sodium chloride 10 mL/hr at 04/28/19 0859  . heparin 1,300 Units/hr (04/29/19 0514)     Time spent: 54mins,  I have personally reviewed and interpreted on  04/29/2019 daily labs, tele strips, imagings as discussed above under date review session and assessment and plans.  I reviewed all nursing notes, pharmacy notes, consultant notes,  vitals, pertinent old records  I have discussed plan of care as described above with RN , patient  on 04/29/2019   [name] MD, PhD  [facility] Pager [phone]. If 7PM-7AM, please contact night-coverage at [email or web address], password [facility] 04/29/2019, 1:07 PM  LOS: 1 day

## 2019-04-29 NOTE — Progress Notes (Signed)
PT Cancellation Note  Patient Details Name: [name] [name] MRN: [id number] DOB: 1951/11/30   Cancelled Treatment:    Reason Eval/Treat Not Completed: Other (comment). Pt recently ambulated in hall with mobility tech without difficulty. Deferred further amb at this time. Reports no concerns about mobility. Will follow up at later date.   [name] 04/29/2019, 2:10 PM [name] [place] Pager [phone] Office [phone]

## 2019-04-29 NOTE — Progress Notes (Signed)
[facility] for heparin Indication: chest pain/ACS  Allergies  Allergen Reactions  . Naproxen Rash    Patient Measurements: Height: 5\' 8"  (172.7 cm) Weight: 293 lb 4.8 oz (133 kg)(A scale) IBW/kg (Calculated) : 63.9 Heparin Dosing Weight: last weight 272 lb  Vital Signs: Temp: 98.4 F (36.9 C) (05/01 1304) Temp Source: Oral (05/01 1304) BP: 159/66 (05/01 1304) Pulse Rate: 82 (05/01 1304)  Labs: Recent Labs    04/27/19 1938 04/28/19 0026 04/28/19 0547  04/28/19 0749 04/28/19 1120 04/28/19 1556 04/29/19 0346 04/29/19 1149  HGB 11.0*  --   --   --   --   --  9.4* 8.7*  --   HCT 38.1  --   --   --   --   --  31.5* 30.0*  --   PLT 308  --   --   --   --   --  232 200  --   APTT  --  28  --   --   --   --   --   --   --   LABPROT  --  14.8  --   --   --   --   --   --   --   INR  --  1.2  --   --   --   --   --   --   --   HEPARINUNFRC  --   --   --    < > 0.39  --  0.46 0.77* 0.28*  CREATININE 3.15*  --   --   --  2.99*  --   --  3.49*  --   TROPONINI 0.38* 6.12* 12.18*  --   --  11.11*  --   --   --    < > = values in this interval not displayed.    Estimated Creatinine Clearance: 22.3 mL/min (A) (by C-G formula based on SCr of 3.49 mg/dL (H)).  Assessment: 68 yo lady with elevated troponin to start heparin.  She was not on anticoagulation PTA.  Baseline Hg 11.0, PTLC 308  Most recent heparin level slightly below goal.  Hgb down slightly, but no overt bleeding or complications noted.  Goal of Therapy:  Heparin level 0.3-0.7 units/ml Monitor platelets by anticoagulation protocol: Yes   Plan: Increase IV heparin back to 1400 units/hr. Heparin to stop tomorrow at 7 AM (after 48 hrs total) Daily heparin level and CBC.  Thanks for allowing pharmacy to be a part of this patient's care.  Marguerite Olea, [facility] Clinical Pharmacist Phone [phone]  04/29/2019 1:13 PM
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
## 2019-04-29 NOTE — Progress Notes (Signed)
Progress Note  Patient Name: [name] [name] Date of Encounter: 04/29/2019  Primary Cardiologist: No primary care provider on file. Irena Gaydos New  Subjective   No further chest pain, no shortness of breath  Inpatient Medications    Scheduled Meds: . allopurinol  100 mg Oral Daily  . aspirin EC  81 mg Oral Daily  . atorvastatin  80 mg Oral q1800  . brinzolamide  1 drop Both Eyes TID  . buPROPion  150 mg Oral BID  . doxycycline  100 mg Oral Q12H  . gabapentin  300 mg Oral TID  . insulin aspart  0-9 Units Subcutaneous TID WC  . insulin glargine  15 Units Subcutaneous Q2200  . ipratropium  2 puff Inhalation Q4H  . latanoprost  1 drop Both Eyes QHS  . linaclotide  72 mcg Oral QAC breakfast  . loratadine  10 mg Oral Daily  . mouth rinse  15 mL Mouth Rinse BID  . mometasone-formoterol  2 puff Inhalation BID  . nicotine  21 mg Transdermal Daily  . pantoprazole  40 mg Oral Daily  . pneumococcal 23 valent vaccine  0.5 mL Intramuscular Tomorrow-1000   Continuous Infusions: . sodium chloride 10 mL/hr at 04/28/19 0859  . heparin 1,300 Units/hr (04/29/19 0514)   PRN Meds: sodium chloride, acetaminophen, albuterol, ALPRAZolam, dextromethorphan-guaiFENesin, hydrALAZINE, HYDROcodone-acetaminophen, nitroGLYCERIN, polyethylene glycol   Vital Signs    Vitals:   04/29/19 0355 04/29/19 0402 04/29/19 0807 04/29/19 0830  BP: (!) 151/71  140/74   Pulse: 79  76   Resp: 20     Temp: 99.1 F (37.3 C)     TempSrc: Oral     SpO2: 95%  100% 99%  Weight:  133 kg    Height:        Intake/Output Summary (Last 24 hours) at 04/29/2019 1040 Last data filed at 04/29/2019 0836 Gross per 24 hour  Intake 1915.06 ml  Output 300 ml  Net 1615.06 ml   Last 3 Weights 04/29/2019 04/28/2019 04/28/2019  Weight (lbs) 293 lb 4.8 oz 288 lb 6.4 oz 289 lb 14.5 oz  Weight (kg) 133.04 kg 130.817 kg 131.5 kg      Telemetry    Yesterday morning had a 12 beat run of nonsustained VT.  Overall sinus rhythm.- Personally  Reviewed  ECG    Sinus rhythm-LVH, nonspecific ST-T wave changes, left axis deviation personally Reviewed  Physical Exam   GEN: No acute distress.   Neck: No JVD Cardiac: RRR, no murmurs, rubs, or gallops.  Respiratory: Clear to auscultation bilaterally. GI: Soft, nontender, non-distended  MS: No edema; No deformity. Neuro:  Nonfocal  Psych: Normal affect   Labs    Chemistry Recent Labs  Lab 04/27/19 1938 04/28/19 0749 04/29/19 0346  NA 142 144 142  K 5.5* 4.8 4.5  CL 113* 113* 114*  CO2 15* 22 17*  GLUCOSE 290* 82 152*  BUN 38* 39* 44*  CREATININE 3.15* 2.99* 3.49*  CALCIUM 9.0 8.7* 8.3*  PROT 7.2  --   --   ALBUMIN 3.6  --   --   AST 29  --   --   ALT 16  --   --   ALKPHOS 161*  --   --   BILITOT 0.4  --   --   GFRNONAA 14* 15* 13*  GFRAA 17* 18* 15*  ANIONGAP 14 9 11      Hematology Recent Labs  Lab 04/27/19 1938 04/28/19 1556 04/29/19 0346  WBC 18.7* 10.0  7.7  RBC 4.53 3.80* 3.60*  HGB 11.0* 9.4* 8.7*  HCT 38.1 31.5* 30.0*  MCV 84.1 82.9 83.3  MCH 24.3* 24.7* 24.2*  MCHC 28.9* 29.8* 29.0*  RDW 18.1* 18.3* 18.3*  PLT 308 232 200    Cardiac Enzymes Recent Labs  Lab 04/27/19 1938 04/28/19 0026 04/28/19 0547 04/28/19 1120  TROPONINI 0.38* 6.12* 12.18* 11.11*   No results for input(s): TROPIPOC in the last 168 hours.   BNP Recent Labs  Lab 04/27/19 1942  BNP 619.8*     DDimer  Recent Labs  Lab 04/27/19 2126  DDIMER 4.21*     Radiology    Nm Pulmonary Perfusion  Result Date: 04/28/2019 CLINICAL DATA:  Short of breath, D-dimer elevated EXAM: NUCLEAR MEDICINE PERFUSION LUNG SCAN TECHNIQUE: Perfusion images were obtained in multiple projections after intravenous injection of radiopharmaceutical. Ventilation scans intentionally deferred if perfusion scan and chest x-ray adequate for interpretation during COVID 19 epidemic. RADIOPHARMACEUTICALS:  1.7 mCi Tc-4m MAA COMPARISON:  Chest radiograph 04/27/2019 FINDINGS: No wedge-shaped  peripheral perfusion defects within the LEFT orRIGHT lung. Normal perfusion pattern. IMPRESSION: No evidence of acute pulmonary embolism. Electronically Signed   By: Suzy Bouchard M.D.   On: 04/28/2019 12:53   Dg Chest Port 1 View  Result Date: 04/27/2019 CLINICAL DATA:  68 year old female with a history shortness of breath EXAM: PORTABLE CHEST 1 VIEW COMPARISON:  09/18/2016 FINDINGS: Cardiomediastinal silhouette unchanged in size and contour. Surgical changes of median sternotomy and CABG. Coarsened interstitial markings throughout, with reticular pattern of opacity more pronounced than the prior. No pneumothorax or pleural effusion. No confluent airspace disease. IMPRESSION: Reticular pattern of opacity, potentially atypical pneumonia, bronchitis, edema, and/or developing interstitial fibrosis. Electronically Signed   By: Corrie Mckusick D.O.   On: 04/27/2019 19:54   Vas [place] Lower Extremity Venous (dvt)  Result Date: 04/28/2019  Lower Venous Study Indications: Swelling.  Risk Factors: Obesity and elevated d-dimer=4.21, CHF, cocaine abuse. Performing Technologist: Maudry Mayhew MHA, RDMS, RVT, RDCS  Examination Guidelines: A complete evaluation includes B-mode imaging, spectral Doppler, color Doppler, and power Doppler as needed of all accessible portions of each vessel. Bilateral testing is considered an integral part of a complete examination. Limited examinations for reoccurring indications may be performed as noted.  +---------+---------------+---------+-----------+----------+--------------+ RIGHT    CompressibilityPhasicitySpontaneityPropertiesSummary        +---------+---------------+---------+-----------+----------+--------------+ CFV      Full           No       Yes                  pulsatile flow +---------+---------------+---------+-----------+----------+--------------+ SFJ      Full                                                         +---------+---------------+---------+-----------+----------+--------------+ FV Prox  Full                                                        +---------+---------------+---------+-----------+----------+--------------+ FV Mid   Full                                                        +---------+---------------+---------+-----------+----------+--------------+  FV DistalFull                                                        +---------+---------------+---------+-----------+----------+--------------+ PFV      Full                                                        +---------+---------------+---------+-----------+----------+--------------+ POP      Full           No       Yes                  pulsatile flow +---------+---------------+---------+-----------+----------+--------------+ PTV                                                   Not visualized +---------+---------------+---------+-----------+----------+--------------+ PERO                                                  Not visualized +---------+---------------+---------+-----------+----------+--------------+   +---------+---------------+---------+-----------+----------+--------------+ LEFT     CompressibilityPhasicitySpontaneityPropertiesSummary        +---------+---------------+---------+-----------+----------+--------------+ CFV      Full           No       No                   pulsatile flow +---------+---------------+---------+-----------+----------+--------------+ SFJ                                                   Not visualized +---------+---------------+---------+-----------+----------+--------------+ FV Prox  Full                                                        +---------+---------------+---------+-----------+----------+--------------+ FV Mid   Full                                                         +---------+---------------+---------+-----------+----------+--------------+ FV DistalFull                                                        +---------+---------------+---------+-----------+----------+--------------+ POP      Full           No       No  pulsatile flow +---------+---------------+---------+-----------+----------+--------------+ PTV      Full                                                        +---------+---------------+---------+-----------+----------+--------------+ PERO     Full                                                        +---------+---------------+---------+-----------+----------+--------------+     Summary: Right: There is no evidence of deep vein thrombosis in the lower extremity. However, portions of this examination were limited- see technologist comments above. No cystic structure found in the popliteal fossa. Left: There is no evidence of deep vein thrombosis in the lower extremity. However, portions of this examination were limited- see technologist comments above. No cystic structure found in the popliteal fossa.  Bilateral lower extremity venous flow is pulsatile, suggestive of elevated right sided heart pressure. *See table(s) above for measurements and observations. Electronically signed by Monica Martinez MD on 04/28/2019 at 5:19:02 PM.    Final     Cardiac Studies   Echocardiogram 04/28/2019: -EF 45%, unchanged from prior  Patient Profile     68 y.o. female with non-ST elevation myocardial infarction, cocaine induced with underlying coronary artery disease status post CABG with mild ischemic cardiomyopathy EF 45%, morbid obesity, tobacco use as well.  Assessment & Plan    Non-ST elevation myocardial infarction - Continue IV heparin for a total of 48 hours, we will go ahead and place a stop order for 7 AM tomorrow morning 04/30/2019, appreciate pharmacy assistance. - Continue with aspirin 81.  Given her prior  history of AVM, will avoid Plavix. - High intensity statin therapy. - Offer cardiac rehab if she is willing to participate.  Ischemic cardiomyopathy - Mild reduction in EF 45%. -No beta-blocker because of ongoing cocaine use -No ACE inhibitor because of chronic kidney disease stage IV  Essential hypertension -If blood pressure continues to be an issue, would suggest adding amlodipine 5 mg once a day.  Cocaine use tobacco use -Recommend cessation.  High mortality risk.  Chronic kidney disease stage IV - Creatinine increased today to 3.49 from 2.99.  Avoid nephrotoxic agents.  Morbid obesity -Continue to encourage weight loss  For questions or updates, please contact [place] Please consult [email or web address] for contact info under        Signed, Candee Furbish, MD  04/29/2019, 10:40 AM

## 2019-04-29 NOTE — Progress Notes (Signed)
   04/29/19 0845  Mobility  Activity Ambulated in hall;Dangled on edge of bed  Range of Motion Active;All extremities  Level of Assistance Modified independent, requires aide device or extra time  Assistive Device Front wheel walker  Minutes Stood 5 minutes  Minutes Ambulated 5 minutes  Distance Ambulated (ft) 200 ft  Mobility Response Tolerated well (Needed one standing rest break)  Bed Position Semi-fowlers   SATURATION QUALIFICATIONS: (This note is used to comply with regulatory documentation for home oxygen)  Patient Saturations on Room Air at Rest = 99%  Patient Saturations on Room Air while Ambulating = 97%  Patient Saturations on n/a Liters of oxygen while Ambulating = n/a%  Please briefly explain why patient needs home oxygen:

## 2019-04-30 DIAGNOSIS — F1411 Cocaine abuse, in remission: Secondary | ICD-10-CM

## 2019-04-30 DIAGNOSIS — I10 Essential (primary) hypertension: Secondary | ICD-10-CM

## 2019-04-30 DIAGNOSIS — R7989 Other specified abnormal findings of blood chemistry: Secondary | ICD-10-CM

## 2019-04-30 LAB — BASIC METABOLIC PANEL
Anion gap: 11 (ref 5–15)
BUN: 49 mg/dL — ABNORMAL HIGH (ref 8–23)
CO2: 18 mmol/L — ABNORMAL LOW (ref 22–32)
Calcium: 8.7 mg/dL — ABNORMAL LOW (ref 8.9–10.3)
Chloride: 113 mmol/L — ABNORMAL HIGH (ref 98–111)
Creatinine, Ser: 3.29 mg/dL — ABNORMAL HIGH (ref 0.44–1.00)
GFR calc Af Amer: 16 mL/min — ABNORMAL LOW (ref >60)
GFR calc non Af Amer: 14 mL/min — ABNORMAL LOW (ref >60)
Glucose, Bld: 114 mg/dL — ABNORMAL HIGH (ref 70–99)
Potassium: 4.6 mmol/L (ref 3.5–5.1)
Sodium: 142 mmol/L (ref 135–145)

## 2019-04-30 LAB — CBC
HCT: 28 % — ABNORMAL LOW (ref 36.0–46.0)
Hemoglobin: 8.3 g/dL — ABNORMAL LOW (ref 12.0–15.0)
MCH: 24.3 pg — ABNORMAL LOW (ref 26.0–34.0)
MCHC: 29.6 g/dL — ABNORMAL LOW (ref 30.0–36.0)
MCV: 82.1 fL (ref 80.0–100.0)
Platelets: 203 10*3/uL (ref 150–400)
RBC: 3.41 MIL/uL — ABNORMAL LOW (ref 3.87–5.11)
RDW: 18.3 % — ABNORMAL HIGH (ref 11.5–15.5)
WBC: 6.6 10*3/uL (ref 4.0–10.5)
nRBC: 0 % (ref 0.0–0.2)

## 2019-04-30 LAB — GLUCOSE, CAPILLARY
Glucose-Capillary: 113 mg/dL — ABNORMAL HIGH (ref 70–99)
Glucose-Capillary: 121 mg/dL — ABNORMAL HIGH (ref 70–99)

## 2019-04-30 LAB — HEPARIN LEVEL (UNFRACTIONATED): Heparin Unfractionated: 0.78 IU/mL — ABNORMAL HIGH (ref 0.30–0.70)

## 2019-04-30 MED ORDER — NITROGLYCERIN 0.4 MG SL SUBL: 0.4000 mg | SUBLINGUAL_TABLET | SUBLINGUAL | 0 refills | Status: AC | PRN

## 2019-04-30 MED ORDER — AMLODIPINE BESYLATE 5 MG PO TABS: 5.0000 mg | ORAL_TABLET | Freq: Every day | ORAL | 11 refills | Status: DC

## 2019-04-30 MED ORDER — ATORVASTATIN CALCIUM 80 MG PO TABS: 80.0000 mg | ORAL_TABLET | Freq: Every day | ORAL | 0 refills | Status: DC

## 2019-04-30 MED ORDER — GABAPENTIN 300 MG PO CAPS: 300.0000 mg | ORAL_CAPSULE | Freq: Two times a day (BID) | ORAL | 0 refills | Status: DC

## 2019-04-30 MED ORDER — GUAIFENESIN ER 600 MG PO TB12: 600.0000 mg | ORAL_TABLET | Freq: Two times a day (BID) | ORAL | 0 refills | Status: DC

## 2019-04-30 MED ORDER — FERROUS SULFATE 325 (65 FE) MG PO TABS: 325.0000 mg | ORAL_TABLET | ORAL | 3 refills | Status: DC

## 2019-04-30 MED ORDER — DOXYCYCLINE HYCLATE 100 MG PO TABS: 100.0000 mg | ORAL_TABLET | Freq: Two times a day (BID) | ORAL | 0 refills | Status: AC

## 2019-04-30 NOTE — Progress Notes (Signed)
Progress Note   Subjective   Doing well today, the patient denies CP or SOB.  No new concerns  Inpatient Medications    Scheduled Meds: . allopurinol  100 mg Oral Daily  . aspirin EC  81 mg Oral Daily  . atorvastatin  80 mg Oral q1800  . brinzolamide  1 drop Both Eyes TID  . buPROPion  150 mg Oral BID  . doxycycline  100 mg Oral Q12H  . fluticasone  2 spray Each Nare Daily  . gabapentin  300 mg Oral BID  . guaiFENesin  600 mg Oral BID  . insulin aspart  0-9 Units Subcutaneous TID WC  . insulin glargine  15 Units Subcutaneous Q2200  . ipratropium  2 puff Inhalation TID  . latanoprost  1 drop Both Eyes QHS  . linaclotide  72 mcg Oral QAC breakfast  . loratadine  10 mg Oral Daily  . mouth rinse  15 mL Mouth Rinse BID  . mometasone-formoterol  2 puff Inhalation BID  . nicotine  21 mg Transdermal Daily  . pantoprazole  40 mg Oral Daily  . pneumococcal 23 valent vaccine  0.5 mL Intramuscular Tomorrow-1000   Continuous Infusions: . sodium chloride 10 mL/hr at 04/28/19 0859   PRN Meds: sodium chloride, acetaminophen, albuterol, ALPRAZolam, dextromethorphan-guaiFENesin, hydrALAZINE, HYDROcodone-acetaminophen, nitroGLYCERIN, polyethylene glycol   Vital Signs    Vitals:   04/30/19 0439 04/30/19 0821 04/30/19 0822 04/30/19 0926  BP: (!) 161/74   (!) 156/69  Pulse: 78   80  Resp: 18     Temp: 98.7 F (37.1 C)     TempSrc: Oral     SpO2: 94% 96% 98% 98%  Weight:      Height:        Intake/Output Summary (Last 24 hours) at 04/30/2019 1156 Last data filed at 04/30/2019 0930 Gross per 24 hour  Intake 1200 ml  Output 1800 ml  Net -600 ml   Filed Weights   04/28/19 0600 04/29/19 0402 04/30/19 0128  Weight: 130.8 kg 133 kg 134.2 kg    Telemetry    sinus - Personally Reviewed  Physical Exam   GEN- The patient is obese appearing, alert and oriented x 3 today.   Head- normocephalic, atraumatic Eyes-  Sclera clear, conjunctiva pink Ears- hearing intact Oropharynx-  clear Neck- supple, + JVD Lungs- decreased BS at bases normal work of breathing Heart- Regular rate and rhythm  GI- soft, NT, ND, + BS Extremities- no clubbing, cyanosis, + dependant edema  MS- diffuse atrophy Skin- no rash or lesion Psych- euthymic mood, full affect Neuro- strength and sensation are intact   Labs    Chemistry Recent Labs  Lab 04/27/19 1938 04/28/19 0749 04/29/19 0346 04/30/19 0804  NA 142 144 142 142  K 5.5* 4.8 4.5 4.6  CL 113* 113* 114* 113*  CO2 15* 22 17* 18*  GLUCOSE 290* 82 152* 114*  BUN 38* 39* 44* 49*  CREATININE 3.15* 2.99* 3.49* 3.29*  CALCIUM 9.0 8.7* 8.3* 8.7*  PROT 7.2  --   --   --   ALBUMIN 3.6  --   --   --   AST 29  --   --   --   ALT 16  --   --   --   ALKPHOS 161*  --   --   --   BILITOT 0.4  --   --   --   GFRNONAA 14* 15* 13* 14*  GFRAA 17* 18* 15* 16*  ANIONGAP 14 9 11 11      Hematology Recent Labs  Lab 04/28/19 1556 04/29/19 0346 04/30/19 0240  WBC 10.0 7.7 6.6  RBC 3.80* 3.60* 3.41*  HGB 9.4* 8.7* 8.3*  HCT 31.5* 30.0* 28.0*  MCV 82.9 83.3 82.1  MCH 24.7* 24.2* 24.3*  MCHC 29.8* 29.0* 29.6*  RDW 18.3* 18.3* 18.3*  PLT 232 200 203    Cardiac Enzymes Recent Labs  Lab 04/27/19 1938 04/28/19 0026 04/28/19 0547 04/28/19 1120  TROPONINI 0.38* 6.12* 12.18* 11.11*   No results for input(s): TROPIPOC in the last 168 hours.     Patient Profile     68 y.o. female with non-ST elevation myocardial infarction, cocaine induced with underlying coronary artery disease status post CABG with mild ischemic cardiomyopathy EF 45%, morbid obesity, tobacco use as well.   Assessment & Plan    1.  NSTEMI/ ischemic CM/ CAD Very unfortunate situation with ongoing cocaine use. Without complete cessation, her prognosis is very poor and anticipated readmission rate quite high. Medical therapy is limited by advanced renal disease and cocaine use. DC heparin drip  2. HTN Cocaine cessation and lifestyle modification, including  weight loss are essential Consider amlodipine  3. Stage IV renal failure with acute on chronic renal failure Stable intrinsic renal disease with proteinuria.  Unlikely secondary to cardiac function but significantly complicates medical therapy. Has outpatient nephrology follow-up  4. Morbid obesity Body mass index is 44.98 kg/m. Lifestyle modification is advised  5. Cocaine/ tobacco cessation  Cardiology to see as needed over the weekend.  Thompson Grayer MD, [facility] 04/30/2019 11:56 AM

## 2019-04-30 NOTE — Progress Notes (Signed)
Pt HR has dropped twice to the low 40s. TRID on call paged. Will continue to monitor pt.

## 2019-04-30 NOTE — Progress Notes (Signed)
BMP collected by [facility] from [place]. Spoke with her face to face to ensure labs were collected, she confirmed.

## 2019-04-30 NOTE — Evaluation (Signed)
Physical Therapy Evaluation Patient Details Name: [name] [name] MRN: [id number] DOB: 1951/09/22 Today's Date: 04/30/2019   History of Present Illness  68 y.o. female with medical history significant of CKD-4, hypertension, hyperlipidemia, diabetes mellitus, stroke, GERD, gout, depression, iron deficiency anemia, CHF with EF of 40%, CAD, CABG, obesity, PVD, tobacco abuse, cocaine abuse, who presents with shortness of breath. COVID negative CXR with reticulatr opacities, lobular PNA. Found to have NSTEMI with Troponin peak at 12 secondary to use of cocaine  Clinical Impression  PTA lives with husband in second story apartment, with 12 steps to climb. Pt reports independence with limited community ambulation with SPC, and independence in iADLs. Pt currently is limited in safe mobility by decreased strength and endurance. Pt requires supervision for bed mobility and transfers and min guard for ambulation of 80 feet with RW. Pt was not up for stair training today but will need prior to d/c home. PT does not anticipate any further PT needs at d/c. PT will continue to follow acutely.     Follow Up Recommendations No PT follow up;Supervision/Assistance - 24 hour    Equipment Recommendations  Rolling walker with 5" wheels;3in1 (PT)       Precautions / Restrictions Precautions Precautions: None Restrictions Weight Bearing Restrictions: No      Mobility  Bed Mobility Overal bed mobility: Needs Assistance Bed Mobility: Supine to Sit;Sit to Supine     Supine to sit: Supervision Sit to supine: Supervision   General bed mobility comments: supervision for safety   Transfers Overall transfer level: Needs assistance Equipment used: Rolling walker (2 wheeled) Transfers: Sit to/from Stand Sit to Stand: Supervision         General transfer comment: supervision for safety, vc for hand placement for powerup  Ambulation/Gait Ambulation/Gait assistance: Min guard Gait Distance (Feet): 80  Feet Assistive device: Rolling walker (2 wheeled) Gait Pattern/deviations: Step-through pattern;Decreased step length - right;Decreased step length - left;Shuffle;Wide base of support Gait velocity: slowed Gait velocity interpretation: <1.8 ft/sec, indicate of risk for recurrent falls General Gait Details: min guard for safety, increased BoS to maintain stability, slowed steady gait        Balance Overall balance assessment: Mild deficits observed, not formally tested                                           Pertinent Vitals/Pain Pain Assessment: No/denies pain    Home Living Family/patient expects to be discharged to:: Private residence Living Arrangements: Spouse/significant other Available Help at Discharge: Family;Available 24 hours/day Type of Home: Apartment Home Access: Stairs to enter Entrance Stairs-Rails: Can reach both Entrance Stairs-Number of Steps: 13 Home Layout: One level Home Equipment: Tub bench;Grab bars - tub/shower;Cane - single point      Prior Function Level of Independence: Independent with assistive device(s)         Comments: ambulates with cane limited community distance,         Extremity/Trunk Assessment   Upper Extremity Assessment Upper Extremity Assessment: Generalized weakness    Lower Extremity Assessment Lower Extremity Assessment: Generalized weakness;RLE deficits/detail;LLE deficits/detail RLE Sensation: decreased light touch;history of peripheral neuropathy(neuropathy to just above ankle) LLE Sensation: decreased light touch;history of peripheral neuropathy(neuropathy to just above ankle)       Communication   Communication: No difficulties  Cognition Arousal/Alertness: Awake/alert Behavior During Therapy: WFL for tasks assessed/performed Overall Cognitive Status: Within Functional  Limits for tasks assessed                                        General Comments General comments  (skin integrity, edema, etc.): Ambulated on RA and maintains SaO2 >93%O2 throughout. Pt with complaint of increased neuropathic tingling today,         Assessment/Plan    PT Assessment Patient needs continued PT services  PT Problem List Decreased strength;Decreased activity tolerance;Decreased mobility;Decreased knowledge of use of DME;Impaired sensation       PT Treatment Interventions DME instruction;Gait training;Stair training;Functional mobility training;Therapeutic activities;Therapeutic exercise;Balance training;Cognitive remediation;Patient/family education    PT Goals (Current goals can be found in the Care Plan section)  Acute Rehab PT Goals Patient Stated Goal: feel better PT Goal Formulation: With patient Time For Goal Achievement: 05/14/19 Potential to Achieve Goals: Good    Frequency Min 3X/week    AM-PAC PT "6 Clicks" Mobility  Outcome Measure Help needed turning from your back to your side while in a flat bed without using bedrails?: None Help needed moving from lying on your back to sitting on the side of a flat bed without using bedrails?: None Help needed moving to and from a bed to a chair (including a wheelchair)?: None Help needed standing up from a chair using your arms (e.g., wheelchair or bedside chair)?: None Help needed to walk in hospital room?: None Help needed climbing 3-5 steps with a railing? : A Little 6 Click Score: 23    End of Session Equipment Utilized During Treatment: Gait belt Activity Tolerance: Patient tolerated treatment well Patient left: in bed;with call bell/phone within reach Nurse Communication: Mobility status PT Visit Diagnosis: Other abnormalities of gait and mobility (R26.89);Muscle weakness (generalized) (M62.81);Difficulty in walking, not elsewhere classified (R26.2);Other symptoms and signs involving the nervous system (I62.703)    Time: 5009-3818 PT Time Calculation (min) (ACUTE ONLY): 28 min   Charges:   PT  Evaluation $PT Eval Moderate Complexity: 1 Mod PT Treatments $Gait Training: 8-22 mins        Jozelyn Kuwahara B. [name] PT, DPT Acute Rehabilitation Services Pager [phone] Office [phone]   [place] 04/30/2019, 11:30 AM

## 2019-04-30 NOTE — Progress Notes (Signed)
Patient was discharged home with help from [facility]. Peripheral iv removed, clean dry and intact, pressure and dressing applied. discharge packet and medication education given. Patient's belongings with patient. Patient was transported by [facility] RN in wheelchair, in room air. patient denies shortness of breath or chest pain. Prescription in discharge packet.

## 2019-04-30 NOTE — Discharge Summary (Addendum)
Discharge Summary  [name] [name] GQQ:[id number] DOB: 07-28-1951  PCP: [place] date: 04/27/2019 Discharge date: 04/30/2019  Time spent: 72mins.   Recommendations for Outpatient Follow-up:  1. F/u with PCP within a week  for hospital discharge follow up, repeat cbc/bmp at follow up. pcp to refer patient to have outpatient sleep study. 2. F/u with cardiology 3. F/u with nephrology  Discharge Diagnoses:  Active Hospital Problems   Diagnosis Date Noted   Acute on chronic respiratory failure with hypoxia (HCC) 04/27/2019   Elevated troponin 04/27/2019   Lobar pneumonia ([place]) 04/27/2019   Sepsis ([place]) 04/27/2019   Hyperkalemia 04/27/2019   Iron deficiency anemia    CAD in native artery    Substance abuse (HCC)    CKD (chronic kidney disease) stage 4, GFR 15-29 ml/min (HCC)    Obesity, Class III, BMI 40-49.9 (morbid obesity) ([place])    Acute on chronic combined systolic and diastolic congestive heart failure ([place]) 05/12/2015   Type 2 diabetes mellitus with renal manifestations, controlled ([place]) 10/18/2014   S/P CABG (coronary artery bypass graft) 09/21/2013   Tobacco abuse    Essential hypertension 06/29/2012   History of cocaine abuse ([place]) 06/29/2012   NSTEMI (non-ST elevated myocardial infarction) ([place]) 06/29/2012   AKI (acute kidney injury) ([place]) 06/29/2012   History of CVA (cerebrovascular accident)    Hyperlipidemia     Resolved Hospital Problems  No resolved problems to display.    Discharge Condition: stable  Diet recommendation: heart healthy/carb modified  Filed Weights   04/28/19 0600 04/29/19 0402 04/30/19 0128  Weight: 130.8 kg 133 kg 134.2 kg    History of present illness: (per admitting MD Dr Blaine Hamper) PCP: [facility]   Patient coming from:  The patient is coming from home.  At baseline, pt is independent for most of ADL.        Chief Complaint: SOB  HPI: [name] [name] is  a 68 y.o. female with medical history significant of CKD-4, hypertension, hyperlipidemia, diabetes mellitus, stroke, GERD, gout, depression, iron deficiency anemia, CHF with EF of 40%, CAD, CABG, obesity, PVD, tobacco abuse, cocaine abuse, who presents with shortness of breath.  Patient states that she started having shortness of breath and anxiety at about 4:30 PM after argument with husband.  She also has dry cough, sore throat, runny nose in the past 2 days.  Denies chest pain. No fever or chill. No recent long distant traveling.  Denies tenderness in the calf areas. Patient does not have nausea, vomiting, diarrhea, abdominal pain, symptoms of UTI.  Denies dark stool or bloody stool bowel movement.  ED Course: pt was found to have negative COVID-19, troponin 0.38, WBC 18.7, lactic acid 3.1, BNP 619, positive d-dimer 4.21, CRP 1.0, LDH 197, procalcitonin 2.7, ferritin 57, fibrinogen 588, triglyceride 66, potassium 5.5, worsening renal function.  Temperature 97.7, tachycardia, tachypnea, oxygen saturation 88% on room air. ECG with sinus tachycardia and lateral TWI (I and aVL). CXR notable for some reticular opacities, possibly atypical PNA. Pt is admitted to telemetry bed as inpatient.  Cardiology, Dr. Charissa Bash was consulted.  Hospital Course:  Principal Problem:   Acute on chronic respiratory failure with hypoxia (HCC) Active Problems:   Hyperlipidemia   History of CVA (cerebrovascular accident)   NSTEMI (non-ST elevated myocardial infarction) ([place])   History of cocaine abuse ([place])   AKI (acute kidney injury) ([place])   Essential hypertension   Tobacco abuse   S/P CABG (  coronary artery bypass graft)   Type 2 diabetes mellitus with renal manifestations, controlled ([place])   Acute on chronic combined systolic and diastolic congestive heart failure (HCC)   Obesity, Class III, BMI 40-49.9 (morbid obesity) (HCC)   CKD (chronic kidney disease) stage 4, GFR 15-29 ml/min (HCC)   CAD in native artery    Substance abuse (HCC)   Iron deficiency anemia   Elevated troponin   Lobar pneumonia (HCC)   Sepsis (HCC)   Hyperkalemia   NSTEMI, h/o CABG She presented to the hospital due to c/o sob Troponin peaked at 12 in the setting of cocaine use H/o CAD s/p CABG Cardiology consulted who recommended medical management due to ongoing cocaine use. She is treated with Heparin drip,  continue asa, statin Not a candidate for  betablocker in the setting of cocaine use She is chest pain free, sob resolved, she is to discharge home and follow up with pcp/cardiology Poor prognosis with ongoing cocaine use.    Chronic combined chf She does has Bilateral lower extremity edema on presentation, edema resolved without intervention, infact , her home dose lasix and metolazone was on hold in the hospital Repeat echo ef 45-50% - lower extremity venous doppler negative for DVT, "Bilateral lower extremity venous flow is pulsatile, suggestive of elevated right sided heart pressure." -home medsLasix/metolazone held on admission due to concerning for sepsis -overall she is improved, edema resolved without intervention with holding home diuretics -she is euvolemic at discharge, home diuretic resumed at discharge, she is to follow up with cardiology/nephrology closely to monitor diuretic and renal function.  PNA? No fever, does has luekocytosis, lactic acidosis on presentation CXR "Reticular pattern of opacity, potentially atypical pneumonia, bronchitis, edema, and/or developing interstitial fibrosis." -covid 19 negative, rapid respiratory viral panel negative, blood culture no growth - urine strep pneumo antigen negative -she does not appear septic, will d/c iv abx rocephin and zithro, change to oral doxycycline and finish total of 5 days treatment  Elevated ddimer: venous doppler  Negative for PE V/Q scan low probability    AKI on CKDIV vs progressive CKD IV -cr was around 2.5 in 2017, cr 3.15- (on  admission)-2.99-3.49-3.29 ( at discharge) -ua + proteinuria, renal US no obstruction -renal dosing meds -she reports she has an appointment with nephrology Dr Joelyn Oms on 5/18  Insulin dependent dm2 Hyperglycemia on presentation, blood glucose 290 on presentation a1c 6.6% continue home dose lantus, d/c glimepiride in the setting of progressive ckd.  Morbid obesity Body mass index is 44.6 kg/m. She is advised to have outpatient sleep study to be arranged by her pcp, she understands  Cocaine She reports couple of girlfriend got stimulus check and had a party Cessation education provided, prognosis guarded   Code Status: full  Family Communication: patient   Disposition Plan: home with cardiology clearance   Consultants:  Cardiology   Procedures:  none  Antibiotics:  As above    Discharge Exam: BP (!) 156/69    Pulse 80    Temp 98.7 F (37.1 C) (Oral)    Resp 18    Ht 5\' 8"  (1.727 m)    Wt 134.2 kg    SpO2 96%    BMI 44.98 kg/m     General:  NAD  Cardiovascular: RRR  Respiratory: CTABL  Abdomen: Soft/ND/NT, positive BS  Musculoskeletal: bilateral pitting  Edema has resolved  Neuro: alert, oriented   Discharge Instructions You were cared for by a hospitalist during your hospital stay. If you have any  questions about your discharge medications or the care you received while you were in the hospital after you are discharged, you can call the unit and asked to speak with the hospitalist on call if the hospitalist that took care of you is not available. Once you are discharged, your primary care physician will handle any further medical issues. Please note that NO REFILLS for any discharge medications will be authorized once you are discharged, as it is imperative that you return to your primary care physician (or establish a relationship with a primary care physician if you do not have one) for your aftercare needs so that they can reassess your need  for medications and monitor your lab values.  Discharge Instructions    Diet - low sodium heart healthy   Complete by:  As directed    Carb modified diet   Increase activity slowly   Complete by:  As directed      Allergies as of 04/30/2019      Reactions   Naproxen Rash      Medication List    STOP taking these medications   glimepiride 4 MG tablet Commonly known as:  AMARYL   HYDROcodone-acetaminophen 5-325 MG tablet Commonly known as:  NORCO/VICODIN   metoprolol tartrate 25 MG tablet Commonly known as:  LOPRESSOR   pravastatin 40 MG tablet Commonly known as:  PRAVACHOL   predniSONE 10 MG tablet Commonly known as:  DELTASONE   traMADol 50 MG tablet Commonly known as:  ULTRAM     TAKE these medications   allopurinol 100 MG tablet Commonly known as:  ZYLOPRIM Take 100 mg by mouth daily.   amLODipine 5 MG tablet Commonly known as:  NORVASC Take 1 tablet (5 mg total) by mouth daily.   aspirin 81 MG tablet Take 81 mg by mouth daily.   atorvastatin 80 MG tablet Commonly known as:  LIPITOR Take 1 tablet (80 mg total) by mouth daily at 6 PM.   brinzolamide 1 % ophthalmic suspension Commonly known as:  AZOPT Place 1 drop into both eyes 3 (three) times daily.   buPROPion 150 MG 12 hr tablet Commonly known as:  WELLBUTRIN SR Take 1 tablet (150 mg total) by mouth 2 (two) times daily.   cetirizine 10 MG tablet Commonly known as:  ZYRTEC Take 10 mg by mouth daily as needed for allergies.   doxycycline 100 MG tablet Commonly known as:  VIBRA-TABS Take 1 tablet (100 mg total) by mouth every 12 (twelve) hours for 3 days.   ferrous sulfate 325 (65 FE) MG tablet Take 1 tablet (325 mg total) by mouth every Monday, Wednesday, and Friday. Start taking on:  May 02, 2019 What changed:  when to take this   Fluticasone-Salmeterol 250-50 MCG/DOSE Aepb Commonly known as:  ADVAIR Inhale 1 puff into the lungs every 12 (twelve) hours.   furosemide 40 MG tablet Commonly  known as:  LASIX Take 1 tablet (40 mg total) by mouth daily. Take additional dose if you experience weight gain. What changed:    how much to take  when to take this  additional instructions   gabapentin 300 MG capsule Commonly known as:  NEURONTIN Take 1 capsule (300 mg total) by mouth 2 (two) times daily. What changed:  when to take this   guaiFENesin 600 MG 12 hr tablet Commonly known as:  MUCINEX Take 1 tablet (600 mg total) by mouth 2 (two) times daily.   Insulin Glargine 100 UNIT/ML Solostar Pen Commonly known  as:  Lantus SoloStar Inject 25 Units into the skin daily at 10 pm. What changed:  how much to take   latanoprost 0.005 % ophthalmic solution Commonly known as:  XALATAN Place 1 drop into both eyes at bedtime.   Linzess 72 MCG capsule Generic drug:  linaclotide Take 72 mcg by mouth daily before breakfast.   metolazone 5 MG tablet Commonly known as:  ZAROXOLYN Take 5 mg by mouth daily.   nitroGLYCERIN 0.4 MG SL tablet Commonly known as:  NITROSTAT Place 1 tablet (0.4 mg total) under the tongue every 5 (five) minutes as needed for chest pain.   pantoprazole 40 MG tablet Commonly known as:  PROTONIX Take 1 tablet (40 mg total) by mouth daily.   polyethylene glycol 17 g packet Commonly known as:  MIRALAX / GLYCOLAX Take 17 g by mouth daily.   ProAir HFA 108 (90 Base) MCG/ACT inhaler Generic drug:  albuterol Inhale 1-2 puffs into the lungs every 6 (six) hours as needed for wheezing.      Allergies  Allergen Reactions   Naproxen Rash   Follow-up Information    [facility] Follow up today.   Why:  hospital discharge follow up, repeat cbc/bmp at follow up.  pcp to arrange outpatient sleep study to rule out sleep apnea. Contact information: [address] [place] [address] [place] [address] [phone]        Rexene Agent, MD Follow up on 05/16/2019.   Specialty:  Nephrology Why:  for chronic kidney  disease  Contact  information: [place] [address] [phone]        [name], MD Follow up.   Specialty:  Cardiology Why:  for heart disease  Contact information: [address] [place] [address] [phone]            The results of significant diagnostics from this hospitalization (including imaging, microbiology, ancillary and laboratory) are listed below for reference.    Significant Diagnostic Studies: Nm Pulmonary Perfusion  Result Date: 04/28/2019 CLINICAL DATA:  Short of breath, D-dimer elevated EXAM: NUCLEAR MEDICINE PERFUSION LUNG SCAN TECHNIQUE: Perfusion images were obtained in multiple projections after intravenous injection of radiopharmaceutical. Ventilation scans intentionally deferred if perfusion scan and chest x-ray adequate for interpretation during COVID 19 epidemic. RADIOPHARMACEUTICALS:  1.7 mCi Tc-47m MAA COMPARISON:  Chest radiograph 04/27/2019 FINDINGS: No wedge-shaped peripheral perfusion defects within the LEFT orRIGHT lung. Normal perfusion pattern. IMPRESSION: No evidence of acute pulmonary embolism. Electronically Signed   By: Suzy Bouchard M.D.   On: 04/28/2019 12:53   US Renal  Result Date: 04/29/2019 CLINICAL DATA:  68 year old female with elevated creatinine EXAM: RENAL / URINARY TRACT ULTRASOUND COMPLETE COMPARISON:  CT 09/18/2016 FINDINGS: Right Kidney: Length: 12.0 cm x 5.2 cm x 5.7 cm, 190 cc. No hydronephrosis. Anechoic cystic lesion with through transmission on the lateral cortex measures 2.3 cm with marginal reflection. Additional anechoic lesion with through transmission on the medial cortex measures 2.1 cm with no internal complexity. Neither with internal flow. Flow in the hilum. No significantly increased echogenicity of the cortex. Left Kidney: Length: 11.2 cm x 5.7 cm x 5.1 cm, 168 cc. No hydronephrosis. Flow confirmed in the hilum. Unremarkable appearance of the cortical echogenicity. Bladder: Appears  normal for degree of bladder distention. IMPRESSION: No evidence of hydronephrosis of the left or right kidney. Right-sided renal cysts, compatible with Bosniak 1/Bosniak 2 category. Electronically Signed   By: Corrie Mckusick D.O.  On: 04/29/2019 16:02   Dg Chest Port 1 View  Result Date: 04/27/2019 CLINICAL DATA:  68 year old female with a history shortness of breath EXAM: PORTABLE CHEST 1 VIEW COMPARISON:  09/18/2016 FINDINGS: Cardiomediastinal silhouette unchanged in size and contour. Surgical changes of median sternotomy and CABG. Coarsened interstitial markings throughout, with reticular pattern of opacity more pronounced than the prior. No pneumothorax or pleural effusion. No confluent airspace disease. IMPRESSION: Reticular pattern of opacity, potentially atypical pneumonia, bronchitis, edema, and/or developing interstitial fibrosis. Electronically Signed   By: Corrie Mckusick D.O.   On: 04/27/2019 19:54   Vas [place] Lower Extremity Venous (dvt)  Result Date: 04/28/2019  Lower Venous Study Indications: Swelling.  Risk Factors: Obesity and elevated d-dimer=4.21, CHF, cocaine abuse. Performing Technologist: Maudry Mayhew MHA, RDMS, RVT, RDCS  Examination Guidelines: A complete evaluation includes B-mode imaging, spectral Doppler, color Doppler, and power Doppler as needed of all accessible portions of each vessel. Bilateral testing is considered an integral part of a complete examination. Limited examinations for reoccurring indications may be performed as noted.  +---------+---------------+---------+-----------+----------+--------------+  RIGHT     Compressibility Phasicity Spontaneity Properties Summary         +---------+---------------+---------+-----------+----------+--------------+  CFV       Full            No        Yes                    pulsatile flow  +---------+---------------+---------+-----------+----------+--------------+  SFJ       Full                                                              +---------+---------------+---------+-----------+----------+--------------+  FV Prox   Full                                                             +---------+---------------+---------+-----------+----------+--------------+  FV Mid    Full                                                             +---------+---------------+---------+-----------+----------+--------------+  FV Distal Full                                                             +---------+---------------+---------+-----------+----------+--------------+  PFV       Full                                                             +---------+---------------+---------+-----------+----------+--------------+  POP  Full            No        Yes                    pulsatile flow  +---------+---------------+---------+-----------+----------+--------------+  PTV                                                        Not visualized  +---------+---------------+---------+-----------+----------+--------------+  PERO                                                       Not visualized  +---------+---------------+---------+-----------+----------+--------------+   +---------+---------------+---------+-----------+----------+--------------+  LEFT      Compressibility Phasicity Spontaneity Properties Summary         +---------+---------------+---------+-----------+----------+--------------+  CFV       Full            No        No                     pulsatile flow  +---------+---------------+---------+-----------+----------+--------------+  SFJ                                                        Not visualized  +---------+---------------+---------+-----------+----------+--------------+  FV Prox   Full                                                             +---------+---------------+---------+-----------+----------+--------------+  FV Mid    Full                                                              +---------+---------------+---------+-----------+----------+--------------+  FV Distal Full                                                             +---------+---------------+---------+-----------+----------+--------------+  POP       Full            No        No                     pulsatile flow  +---------+---------------+---------+-----------+----------+--------------+  PTV       Full                                                             +---------+---------------+---------+-----------+----------+--------------+  PERO      Full                                                             +---------+---------------+---------+-----------+----------+--------------+     Summary: Right: There is no evidence of deep vein thrombosis in the lower extremity. However, portions of this examination were limited- see technologist comments above. No cystic structure found in the popliteal fossa. Left: There is no evidence of deep vein thrombosis in the lower extremity. However, portions of this examination were limited- see technologist comments above. No cystic structure found in the popliteal fossa.  Bilateral lower extremity venous flow is pulsatile, suggestive of elevated right sided heart pressure. *See table(s) above for measurements and observations. Electronically signed by Monica Martinez MD on 04/28/2019 at 5:19:02 PM.    Final     Microbiology: Recent Results (from the past 240 hour(s))  Blood Culture (routine x 2)     Status: None (Preliminary result)   Collection Time: 04/27/19  9:15 PM  Result Value Ref Range Status   Specimen Description BLOOD RIGHT ANTECUBITAL  Final   Special Requests   Final    BOTTLES DRAWN AEROBIC AND ANAEROBIC Blood Culture results may not be optimal due to an inadequate volume of blood received in culture bottles   Culture   Final    NO GROWTH 3 DAYS Performed at [facility], [place] [address], [place], [place] [address]    Report Status PENDING  Incomplete    Blood Culture (routine x 2)     Status: None (Preliminary result)   Collection Time: 04/27/19  9:27 PM  Result Value Ref Range Status   Specimen Description BLOOD RIGHT HAND  Final   Special Requests   Final    BOTTLES DRAWN AEROBIC AND ANAEROBIC Blood Culture results may not be optimal due to an inadequate volume of blood received in culture bottles   Culture   Final    NO GROWTH 3 DAYS Performed at [facility], [place] [address] [place], [place] [address]    Report Status PENDING  Incomplete  SARS Coronavirus 2 [facility] order, Performed in [facility] hospital lab)     Status: None   Collection Time: 04/27/19 10:10 PM  Result Value Ref Range Status   SARS Coronavirus 2 NEGATIVE NEGATIVE Final    Comment: (NOTE) If result is NEGATIVE SARS-CoV-2 target nucleic acids are NOT DETECTED. The SARS-CoV-2 RNA is generally detectable in upper and lower  respiratory specimens during the acute phase of infection. The lowest  concentration of SARS-CoV-2 viral copies this assay can detect is 250  copies / mL. A negative result does not preclude SARS-CoV-2 infection  and should not be used as the sole basis for treatment or other  patient management decisions.  A negative result may occur with  improper specimen collection / handling, submission of specimen other  than nasopharyngeal swab, presence of viral mutation(s) within the  areas targeted by this assay, and inadequate number of viral copies  (<250 copies / mL). A negative result must be combined with clinical  observations, patient history, and epidemiological information. If result is POSITIVE SARS-CoV-2 target nucleic acids are DETECTED. The SARS-CoV-2 RNA is generally detectable in upper and lower  respiratory specimens dur ing the acute  phase of infection.  Positive  results are indicative of active infection with SARS-CoV-2.  Clinical  correlation with patient history and other diagnostic information is  necessary to  determine patient infection status.  Positive results do  not rule out bacterial infection or co-infection with other viruses. If result is PRESUMPTIVE POSTIVE SARS-CoV-2 nucleic acids MAY BE PRESENT.   A presumptive positive result was obtained on the submitted specimen  and confirmed on repeat testing.  While 2019 novel coronavirus  (SARS-CoV-2) nucleic acids may be present in the submitted sample  additional confirmatory testing may be necessary for epidemiological  and / or clinical management purposes  to differentiate between  SARS-CoV-2 and other Sarbecovirus currently known to infect humans.  If clinically indicated additional testing with an alternate test  methodology [phone] is advised. The SARS-CoV-2 RNA is generally  detectable in upper and lower respiratory sp ecimens during the acute  phase of infection. The expected result is Negative. Fact Sheet for Patients:  StrictlyIdeas.no Fact Sheet for Healthcare Providers: BankingDealers.co.za This test is not yet approved or cleared by the [place] FDA and has been authorized for detection and/or diagnosis of SARS-CoV-2 by FDA under an Emergency Use Authorization (EUA).  This EUA will remain in effect (meaning this test can be used) for the duration of the COVID-19 declaration under Section 564(b)(1) of the Act, 21 U.S.C. section 360bbb-3(b)(1), unless the authorization is terminated or revoked sooner. Performed at [facility], [place] [address], [place], [place] [address]   Respiratory Panel by PCR     Status: None   Collection Time: 04/27/19 10:10 PM  Result Value Ref Range Status   Adenovirus NOT DETECTED NOT DETECTED Final   Coronavirus 229E NOT DETECTED NOT DETECTED Final    Comment: (NOTE) The Coronavirus on the Respiratory Panel, DOES NOT test for the novel  Coronavirus (2019 nCoV)    Coronavirus HKU1 NOT DETECTED NOT DETECTED Final   Coronavirus NL63 NOT  DETECTED NOT DETECTED Final   Coronavirus OC43 NOT DETECTED NOT DETECTED Final   Metapneumovirus NOT DETECTED NOT DETECTED Final   Rhinovirus / Enterovirus NOT DETECTED NOT DETECTED Final   Influenza A NOT DETECTED NOT DETECTED Final   Influenza B NOT DETECTED NOT DETECTED Final   Parainfluenza Virus 1 NOT DETECTED NOT DETECTED Final   Parainfluenza Virus 2 NOT DETECTED NOT DETECTED Final   Parainfluenza Virus 3 NOT DETECTED NOT DETECTED Final   Parainfluenza Virus 4 NOT DETECTED NOT DETECTED Final   Respiratory Syncytial Virus NOT DETECTED NOT DETECTED Final   Bordetella pertussis NOT DETECTED NOT DETECTED Final   Chlamydophila pneumoniae NOT DETECTED NOT DETECTED Final   Mycoplasma pneumoniae NOT DETECTED NOT DETECTED Final    Comment: Performed at [facility], [place] [address], [place], [place] [address]     Labs: Basic Metabolic Panel: Recent Labs  Lab 04/27/19 1938 04/28/19 0749 04/29/19 0346 04/30/19 0804  NA 142 144 142 142  K 5.5* 4.8 4.5 4.6  CL 113* 113* 114* 113*  CO2 15* 22 17* 18*  GLUCOSE 290* 82 152* 114*  BUN 38* 39* 44* 49*  CREATININE 3.15* 2.99* 3.49* 3.29*  CALCIUM 9.0 8.7* 8.3* 8.7*   Liver Function Tests: Recent Labs  Lab 04/27/19 1938  AST 29  ALT 16  ALKPHOS 161*  BILITOT 0.4  PROT 7.2  ALBUMIN 3.6   No results for input(s): LIPASE, AMYLASE in the last 168 hours. No results for input(s): AMMONIA in the last 168  hours. CBC: Recent Labs  Lab 04/27/19 1938 04/28/19 1556 04/29/19 0346 04/30/19 0240  WBC 18.7* 10.0 7.7 6.6  NEUTROABS 16.8*  --   --   --   HGB 11.0* 9.4* 8.7* 8.3*  HCT 38.1 31.5* 30.0* 28.0*  MCV 84.1 82.9 83.3 82.1  PLT 308 232 200 203   Cardiac Enzymes: Recent Labs  Lab 04/27/19 1938 04/28/19 0026 04/28/19 0547 04/28/19 1120  TROPONINI 0.38* 6.12* 12.18* 11.11*   BNP: BNP (last 3 results) Recent Labs    04/27/19 1942  BNP 619.8*    ProBNP (last 3 results) No results for input(s): PROBNP in the  last 8760 hours.  CBG: Recent Labs  Lab 04/29/19 1123 04/29/19 1608 04/29/19 2131 04/30/19 0618 04/30/19 1113  GLUCAP 136* 192* 145* 113* 121*       Signed:  [name] MD, PhD  [facility] 04/30/2019, 1:35 PM

## 2019-04-30 NOTE — Progress Notes (Signed)
[place] for heparin Indication: chest pain/ACS  Allergies  Allergen Reactions  . Naproxen Rash    Patient Measurements: Height: 5\' 8"  (172.7 cm) Weight: 295 lb 12.8 oz (134.2 kg) IBW/kg (Calculated) : 63.9 Heparin Dosing Weight: last weight 272 lb  Vital Signs: Temp: 98.5 F (36.9 C) (05/02 0120) Temp Source: Oral (05/02 0120) BP: 146/74 (05/02 0120) Pulse Rate: 78 (05/02 0120)  Labs: Recent Labs    04/27/19 1938 04/28/19 0026 04/28/19 0547  04/28/19 0749 04/28/19 1120 04/28/19 1556 04/29/19 0346 04/29/19 1149 04/30/19 0240  HGB 11.0*  --   --   --   --   --  9.4* 8.7*  --  8.3*  HCT 38.1  --   --   --   --   --  31.5* 30.0*  --  28.0*  PLT 308  --   --   --   --   --  232 200  --  203  APTT  --  28  --   --   --   --   --   --   --   --   LABPROT  --  14.8  --   --   --   --   --   --   --   --   INR  --  1.2  --   --   --   --   --   --   --   --   HEPARINUNFRC  --   --   --    < > 0.39  --  0.46 0.77* 0.28* 0.78*  CREATININE 3.15*  --   --   --  2.99*  --   --  3.49*  --   --   TROPONINI 0.38* 6.12* 12.18*  --   --  11.11*  --   --   --   --    < > = values in this interval not displayed.    Estimated Creatinine Clearance: 22.4 mL/min (A) (by C-G formula based on SCr of 3.49 mg/dL (H)).  Assessment: 68 yo lady with elevated troponin to start heparin.  She was not on anticoagulation PTA.  Baseline Hg 11.0, PTLC 308  Heparin level this am 0.78 units/ml  Goal of Therapy:  Heparin level 0.3-0.7 units/ml Monitor platelets by anticoagulation protocol: Yes   Plan: Decrease IV heparin slightly to 1350 units/hr. Daily heparin level and CBC.  Thanks for allowing pharmacy to be a part of this patient's care.  Thanks for allowing pharmacy to be a part of this patient's care.  Excell Seltzer, PharmD Clinical Pharmacist

## 2019-05-02 LAB — CULTURE, BLOOD (ROUTINE X 2)
Culture: NO GROWTH
Culture: NO GROWTH

## 2019-05-27 ENCOUNTER — Ambulatory Visit (INDEPENDENT_AMBULATORY_CARE_PROVIDER_SITE_OTHER): Payer: Medicare Other | Admitting: Internal Medicine

## 2019-05-27 ENCOUNTER — Encounter: Payer: Self-pay | Admitting: Internal Medicine

## 2019-05-27 ENCOUNTER — Other Ambulatory Visit: Payer: Self-pay

## 2019-05-27 VITALS — Ht 68.5 in | Wt 283.0 lb

## 2019-05-27 DIAGNOSIS — K5520 Angiodysplasia of colon without hemorrhage: Secondary | ICD-10-CM | POA: Diagnosis not present

## 2019-05-27 DIAGNOSIS — D500 Iron deficiency anemia secondary to blood loss (chronic): Secondary | ICD-10-CM | POA: Diagnosis not present

## 2019-05-27 DIAGNOSIS — R195 Other fecal abnormalities: Secondary | ICD-10-CM

## 2019-05-27 NOTE — Progress Notes (Signed)
TELEHEALTH ENCOUNTER IN SETTING OF COVID-19 PANDEMIC - REQUESTED BY PATIENT SERVICE PROVIDED BY TELEMEDECINE - TYPE: phone PATIENT LOCATION: home PATIENT HAS CONSENTED TO TELEHEALTH VISIT PROVIDER LOCATION: OFFICE REFERRING PROVIDER:Ryan Sanford, MD PARTICIPANTS OTHER THAN PATIENT:none TIME SPENT ON CALL: 12 minutes    [name] [name] 68 y.o. [birth year] [id number]  Assessment & Plan:   Encounter Diagnoses  Name Primary?   Angiodysplasia of colon Yes   Iron deficiency anemia due to chronic blood loss    Heme + stool     I suspect she is bleeding from the AVM or AVMs in the colon.  That was not treated at her 2017 colonoscopy.  Will schedule a colonoscopy at the hospital where I have argon plasma coagulation available.The risks and benefits as well as alternatives of endoscopic procedure(s) have been discussed and reviewed. All questions answered. The patient agrees to proceed. I have explained she will need COVID-19 testing prior to the procedure.  Hold ferrous sulfate 3 days before.  I appreciate the opportunity to care for this patient. CC: [facility] Dr. Pearson Grippe  Subjective:   Chief Complaint: Heme positive stool and anemia  HPI [name] is a 68 year old African-American woman who was found to be Hemoccult positive have a ferritin of 28 and a hemoglobin of 8.9 in February.  Her original appointment with me was rescheduled due to the COVID-19 outbreak.  Subsequently she was hospitalized with chest pain and dyspnea after using cocaine, her hemoglobin when she left the hospital was 8.3 with MCV 82.  She was seen and cleared by cardiology.  She has chronic kidney disease with a GFR in the low teens.  When she had a work-up in 2017 she had a negative EGD but she had an AVM in the transverse colon that was not treated on colonoscopy by Dr. Henrene Pastor.  She denies any melena or bright red blood per rectum.  She does take Linzess for constipation.   MiraLAX also.  She is a diabetic. Allergies  Allergen Reactions   Naproxen Rash   Current Meds  Medication Sig   allopurinol (ZYLOPRIM) 100 MG tablet Take 100 mg by mouth daily.   amLODipine (NORVASC) 5 MG tablet Take 1 tablet (5 mg total) by mouth daily.   aspirin 81 MG tablet Take 81 mg by mouth daily.    atorvastatin (LIPITOR) 80 MG tablet Take 1 tablet (80 mg total) by mouth daily at 6 PM.   brinzolamide (AZOPT) 1 % ophthalmic suspension Place 1 drop into both eyes 3 (three) times daily.   buPROPion (WELLBUTRIN SR) 150 MG 12 hr tablet Take 1 tablet (150 mg total) by mouth 2 (two) times daily.   cetirizine (ZYRTEC) 10 MG tablet Take 10 mg by mouth daily as needed for allergies.    ferrous sulfate 325 (65 FE) MG tablet Take 325 mg by mouth 3 (three) times daily with meals.   Fluticasone-Salmeterol (ADVAIR) 250-50 MCG/DOSE AEPB Inhale 1 puff into the lungs every 12 (twelve) hours.   furosemide (LASIX) 40 MG tablet Take 1 tablet (40 mg total) by mouth daily. Take additional dose if you experience weight gain. (Patient taking differently: Take 40-80 mg by mouth See admin instructions. Take 2 tablets in the morning and 1 tablet at bedtime)   gabapentin (NEURONTIN) 300 MG capsule Take 300 mg by mouth 3 (three) times daily.   Insulin Glargine (LANTUS SOLOSTAR) 100 UNIT/ML Solostar Pen Inject 25 Units into the skin daily at 10  pm. (Patient taking differently: Inject 21 Units into the skin daily at 10 pm. )   latanoprost (XALATAN) 0.005 % ophthalmic solution Place 1 drop into both eyes at bedtime.   LINZESS 72 MCG capsule Take 72 mcg by mouth daily before breakfast.    metolazone (ZAROXOLYN) 5 MG tablet Take 5 mg by mouth daily.   nitroGLYCERIN (NITROSTAT) 0.4 MG SL tablet Place 1 tablet (0.4 mg total) under the tongue every 5 (five) minutes as needed for chest pain.   pantoprazole (PROTONIX) 40 MG tablet Take 1 tablet (40 mg total) by mouth daily.   polyethylene glycol (MIRALAX  / GLYCOLAX) 17 g packet Take 17 g by mouth daily as needed.   PROAIR HFA 108 (90 Base) MCG/ACT inhaler Inhale 1-2 puffs into the lungs every 6 (six) hours as needed for wheezing.    Past Medical History:  Diagnosis Date   Anemia 08/2016   Angiodysplasia of colon    Arthritis of left shoulder region 03/23/2013   Chronic combined systolic and diastolic CHF (congestive heart failure) (HCC)    a. EF 40-45%, mild LVH, mid apicalanteroseptal and apical HK.   CKD (chronic kidney disease), stage III ([place])    Cocaine abuse ([place])    crack cocaine heavily until 2008 then sporadic use since then   Coronary artery disease    a. 06/2012 NSTEMI/CABG x 3 (LIMA->LAD, VG->OM2, VG->LCX);  b. 04/2015 MV: EF<30%, mid ant, apicalanterior, apical infarct;  c. 04/2015 Cath: LM nl, LAD 90p, LCX 9m, OM1 min irregs, RCA mild dzs, LIMA->LAD nl w/ dist LAD dzs, VG->OM2 nl, VG->LCX nl-->Med Rx.   CVA (cerebral infarction)    a. right internal capsule stroke in 12/2006   Diabetes mellitus    diagnosed in 2008   Essential hypertension    Glaucoma    Gout    HFrEF (heart failure with reduced ejection fraction) (HCC)    Hyperlipidemia    Hyperparathyroidism, secondary renal (HCC)    Left-sided sensory deficit present    Obesity, morbid ([place])    PVD (peripheral vascular disease) ([place])    a. 06/2012 ABI's: R - 0.73, L - 0.71.   Renal mass, right    Shortness of breath dyspnea    Stroke [facility])    Thyroid nodule    FNA in 1610 showed follicular cells but not definate neoplasm   Tobacco abuse    Trichomoniasis    Past Surgical History:  Procedure Laterality Date   CARDIAC CATHETERIZATION     CARDIAC CATHETERIZATION N/A 05/17/2015   Procedure: Left Heart Cath and Cors/Grafts Angiography;  Surgeon: [name], MD;  Location: [place] CV LAB;  Service: Cardiovascular;  Laterality: N/A;   COLONOSCOPY WITH PROPOFOL N/A 04/21/2016   Procedure: COLONOSCOPY WITH PROPOFOL;  Surgeon: Irene Shipper, MD;  Location: [place];  Service: Endoscopy;  Laterality: N/A;   CORONARY ARTERY BYPASS GRAFT  07/09/2012   Procedure: CORONARY ARTERY BYPASS GRAFTING (CABG);  Surgeon: Ivin Poot, MD;  Location: [place];  Service: Open Heart Surgery;  Laterality: N/A;   ESOPHAGOGASTRODUODENOSCOPY N/A 04/20/2016   Procedure: ESOPHAGOGASTRODUODENOSCOPY (EGD);  Surgeon: Gatha Mayer, MD;  Location: [facility] ENDOSCOPY;  Service: Endoscopy;  Laterality: N/A;   LEFT HEART CATHETERIZATION WITH CORONARY ANGIOGRAM N/A 06/29/2012   Procedure: LEFT HEART CATHETERIZATION WITH CORONARY ANGIOGRAM;  Surgeon: Peter M Martinique, MD;  Location: [facility] CATH LAB;  Service: Cardiovascular;  Laterality: N/A;   STERNAL WOUND DEBRIDEMENT  08/17/2012   Procedure: STERNAL WOUND DEBRIDEMENT;  Surgeon:  Ivin Poot, MD;  Location: [place];  Service: Thoracic;  Laterality: N/A;  wound vac application   STERNAL WOUND DEBRIDEMENT  08/24/2012   Procedure: STERNAL WOUND DEBRIDEMENT;  Surgeon: Ivin Poot, MD;  Location: [place];  Service: Thoracic;  Laterality: N/A;   STERNAL WOUND DEBRIDEMENT  09/01/2012   Procedure: STERNAL WOUND DEBRIDEMENT;  Surgeon: Ivin Poot, MD;  Location: [place];  Service: Thoracic;  Laterality: N/A;   STERNAL WOUND DEBRIDEMENT  09/20/2012   Procedure: STERNAL WOUND DEBRIDEMENT;  Surgeon: Ivin Poot, MD;  Location: [facility] OR;  Service: Thoracic;  Laterality: N/A;  wound vac change   Social History   Social History Narrative   Lives with boyfriend of 20 years.    family history includes Cancer in her mother; Cerebrovascular Accident in her father; Diabetes in her mother; Gout in her father; Hyperlipidemia in her father; Hypertension in her father and mother; Kidney disease in her father; Other in an other family member.   Review of Systems Some fatigue.  All other review of systems appear negative at this time.

## 2019-05-27 NOTE — Patient Instructions (Addendum)
It was nice to speak to you today.  As I explained, I think you are leaking blood from a spot in the colon called an AVM also known as angiodysplasia.  That was found on your 2017 colonoscopy.  There may be more of these.  I can treat those by cauterizing them so they do not leak blood anymore.  It is possible something else is causing this and I will look for that as well.  We will schedule a colonoscopy at [facility] long [facility] in the future. You will need to be tested for the COVID-19 virus prior to that. We will adjust your insulin per our protocols and that will all be explained. You should stop taking your iron pills 3 days before your colonoscopy.   I appreciate the opportunity to care for you. Silvano Rusk, MD, [facility]

## 2019-05-30 DIAGNOSIS — I517 Cardiomegaly: Secondary | ICD-10-CM

## 2019-05-30 DIAGNOSIS — J811 Chronic pulmonary edema: Secondary | ICD-10-CM

## 2019-05-30 HISTORY — DX: Cardiomegaly: I51.7

## 2019-05-30 HISTORY — DX: Chronic pulmonary edema: J81.1

## 2019-05-31 ENCOUNTER — Other Ambulatory Visit: Payer: Self-pay | Admitting: Internal Medicine

## 2019-05-31 ENCOUNTER — Telehealth: Payer: Self-pay

## 2019-05-31 DIAGNOSIS — D500 Iron deficiency anemia secondary to blood loss (chronic): Secondary | ICD-10-CM

## 2019-05-31 DIAGNOSIS — R195 Other fecal abnormalities: Secondary | ICD-10-CM

## 2019-05-31 DIAGNOSIS — K5520 Angiodysplasia of colon without hemorrhage: Secondary | ICD-10-CM

## 2019-05-31 NOTE — Telephone Encounter (Signed)
Per Dr Carlean Purl patient will be set up for a colonoscopy at [facility] , I checked with her and the date is good for her. I will call her back with a time and mail her out instructions. Patient set up for 06/21/2019 at 9:15AM, arrive at 7:45AM. COVID testing appointment set up for 06/17/2019. Patient will call with any questions once she gets the instructions.

## 2019-05-31 NOTE — Telephone Encounter (Signed)
-----   Message from Gatha Mayer, MD sent at 05/31/2019  2:51 PM EDT ----- Regarding: colonoscopy + APC 6/23 Please arrange for colonoscopy with [facility] at hospital on 6/23 - I have a block

## 2019-06-01 ENCOUNTER — Inpatient Hospital Stay (HOSPITAL_COMMUNITY)
Admission: EM | Admit: 2019-06-01 | Discharge: 2019-06-03 | DRG: 291 | Disposition: A | Discharge status: Home / Self Care | Payer: Medicare Other | Attending: Internal Medicine | Admitting: Internal Medicine

## 2019-06-01 ENCOUNTER — Emergency Department (HOSPITAL_COMMUNITY): Payer: Medicare Other

## 2019-06-01 DIAGNOSIS — Z992 Dependence on renal dialysis: Secondary | ICD-10-CM | POA: Diagnosis present

## 2019-06-01 DIAGNOSIS — Z8673 Personal history of transient ischemic attack (TIA), and cerebral infarction without residual deficits: Secondary | ICD-10-CM

## 2019-06-01 DIAGNOSIS — J9601 Acute respiratory failure with hypoxia: Secondary | ICD-10-CM

## 2019-06-01 DIAGNOSIS — I248 Other forms of acute ischemic heart disease: Secondary | ICD-10-CM | POA: Diagnosis present

## 2019-06-01 DIAGNOSIS — Z87891 Personal history of nicotine dependence: Secondary | ICD-10-CM

## 2019-06-01 DIAGNOSIS — J9602 Acute respiratory failure with hypercapnia: Secondary | ICD-10-CM | POA: Diagnosis present

## 2019-06-01 DIAGNOSIS — I130 Hypertensive heart and chronic kidney disease with heart failure and stage 1 through stage 4 chronic kidney disease, or unspecified chronic kidney disease: Principal | ICD-10-CM | POA: Diagnosis present

## 2019-06-01 DIAGNOSIS — Z794 Long term (current) use of insulin: Secondary | ICD-10-CM

## 2019-06-01 DIAGNOSIS — I2510 Atherosclerotic heart disease of native coronary artery without angina pectoris: Secondary | ICD-10-CM | POA: Diagnosis present

## 2019-06-01 DIAGNOSIS — R778 Other specified abnormalities of plasma proteins: Secondary | ICD-10-CM | POA: Diagnosis present

## 2019-06-01 DIAGNOSIS — N179 Acute kidney failure, unspecified: Secondary | ICD-10-CM | POA: Diagnosis present

## 2019-06-01 DIAGNOSIS — E1121 Type 2 diabetes mellitus with diabetic nephropathy: Secondary | ICD-10-CM

## 2019-06-01 DIAGNOSIS — I5043 Acute on chronic combined systolic (congestive) and diastolic (congestive) heart failure: Secondary | ICD-10-CM | POA: Diagnosis not present

## 2019-06-01 DIAGNOSIS — K219 Gastro-esophageal reflux disease without esophagitis: Secondary | ICD-10-CM | POA: Diagnosis present

## 2019-06-01 DIAGNOSIS — H409 Unspecified glaucoma: Secondary | ICD-10-CM | POA: Diagnosis present

## 2019-06-01 DIAGNOSIS — Z823 Family history of stroke: Secondary | ICD-10-CM

## 2019-06-01 DIAGNOSIS — M109 Gout, unspecified: Secondary | ICD-10-CM | POA: Diagnosis present

## 2019-06-01 DIAGNOSIS — A599 Trichomoniasis, unspecified: Secondary | ICD-10-CM | POA: Diagnosis present

## 2019-06-01 DIAGNOSIS — E1151 Type 2 diabetes mellitus with diabetic peripheral angiopathy without gangrene: Secondary | ICD-10-CM | POA: Diagnosis present

## 2019-06-01 DIAGNOSIS — Z8249 Family history of ischemic heart disease and other diseases of the circulatory system: Secondary | ICD-10-CM

## 2019-06-01 DIAGNOSIS — Z20828 Contact with and (suspected) exposure to other viral communicable diseases: Secondary | ICD-10-CM | POA: Diagnosis present

## 2019-06-01 DIAGNOSIS — D509 Iron deficiency anemia, unspecified: Secondary | ICD-10-CM | POA: Diagnosis present

## 2019-06-01 DIAGNOSIS — R001 Bradycardia, unspecified: Secondary | ICD-10-CM | POA: Diagnosis present

## 2019-06-01 DIAGNOSIS — Z951 Presence of aortocoronary bypass graft: Secondary | ICD-10-CM

## 2019-06-01 DIAGNOSIS — Z79899 Other long term (current) drug therapy: Secondary | ICD-10-CM

## 2019-06-01 DIAGNOSIS — I441 Atrioventricular block, second degree: Secondary | ICD-10-CM | POA: Diagnosis present

## 2019-06-01 DIAGNOSIS — I161 Hypertensive emergency: Secondary | ICD-10-CM | POA: Diagnosis present

## 2019-06-01 DIAGNOSIS — Z841 Family history of disorders of kidney and ureter: Secondary | ICD-10-CM

## 2019-06-01 DIAGNOSIS — I169 Hypertensive crisis, unspecified: Secondary | ICD-10-CM | POA: Diagnosis present

## 2019-06-01 DIAGNOSIS — I10 Essential (primary) hypertension: Secondary | ICD-10-CM | POA: Diagnosis present

## 2019-06-01 DIAGNOSIS — F1410 Cocaine abuse, uncomplicated: Secondary | ICD-10-CM | POA: Diagnosis present

## 2019-06-01 DIAGNOSIS — J810 Acute pulmonary edema: Secondary | ICD-10-CM | POA: Diagnosis present

## 2019-06-01 DIAGNOSIS — Z7951 Long term (current) use of inhaled steroids: Secondary | ICD-10-CM

## 2019-06-01 DIAGNOSIS — Z8349 Family history of other endocrine, nutritional and metabolic diseases: Secondary | ICD-10-CM

## 2019-06-01 DIAGNOSIS — N186 End stage renal disease: Secondary | ICD-10-CM | POA: Diagnosis present

## 2019-06-01 DIAGNOSIS — Z6841 Body Mass Index (BMI) 40.0 and over, adult: Secondary | ICD-10-CM

## 2019-06-01 DIAGNOSIS — Z833 Family history of diabetes mellitus: Secondary | ICD-10-CM

## 2019-06-01 DIAGNOSIS — I252 Old myocardial infarction: Secondary | ICD-10-CM

## 2019-06-01 DIAGNOSIS — N184 Chronic kidney disease, stage 4 (severe): Secondary | ICD-10-CM | POA: Diagnosis present

## 2019-06-01 DIAGNOSIS — J449 Chronic obstructive pulmonary disease, unspecified: Secondary | ICD-10-CM | POA: Diagnosis present

## 2019-06-01 DIAGNOSIS — N2581 Secondary hyperparathyroidism of renal origin: Secondary | ICD-10-CM | POA: Diagnosis present

## 2019-06-01 DIAGNOSIS — E785 Hyperlipidemia, unspecified: Secondary | ICD-10-CM | POA: Diagnosis present

## 2019-06-01 DIAGNOSIS — F1910 Other psychoactive substance abuse, uncomplicated: Secondary | ICD-10-CM | POA: Diagnosis present

## 2019-06-01 DIAGNOSIS — Z7982 Long term (current) use of aspirin: Secondary | ICD-10-CM

## 2019-06-01 DIAGNOSIS — E1122 Type 2 diabetes mellitus with diabetic chronic kidney disease: Secondary | ICD-10-CM | POA: Diagnosis present

## 2019-06-01 HISTORY — DX: Acute respiratory failure with hypoxia: J96.01

## 2019-06-01 LAB — CBC WITH DIFFERENTIAL/PLATELET
Abs Immature Granulocytes: 0.12 10*3/uL — ABNORMAL HIGH (ref 0.00–0.07)
Basophils Absolute: 0.1 10*3/uL (ref 0.0–0.1)
Basophils Relative: 1 %
Eosinophils Absolute: 0.3 10*3/uL (ref 0.0–0.5)
Eosinophils Relative: 3 %
HCT: 36.4 % (ref 36.0–46.0)
Hemoglobin: 10.3 g/dL — ABNORMAL LOW (ref 12.0–15.0)
Immature Granulocytes: 1 %
Lymphocytes Relative: 34 %
Lymphs Abs: 3.3 10*3/uL (ref 0.7–4.0)
MCH: 24 pg — ABNORMAL LOW (ref 26.0–34.0)
MCHC: 28.3 g/dL — ABNORMAL LOW (ref 30.0–36.0)
MCV: 84.7 fL (ref 80.0–100.0)
Monocytes Absolute: 0.8 10*3/uL (ref 0.1–1.0)
Monocytes Relative: 8 %
Neutro Abs: 5.2 10*3/uL (ref 1.7–7.7)
Neutrophils Relative %: 53 %
Platelets: 467 10*3/uL — ABNORMAL HIGH (ref 150–400)
RBC: 4.3 MIL/uL (ref 3.87–5.11)
RDW: 18.2 % — ABNORMAL HIGH (ref 11.5–15.5)
WBC: 9.9 10*3/uL (ref 4.0–10.5)
nRBC: 0.4 % — ABNORMAL HIGH (ref 0.0–0.2)

## 2019-06-01 LAB — POCT I-STAT 7, (LYTES, BLD GAS, ICA,H+H)
Acid-base deficit: 12 mmol/L — ABNORMAL HIGH (ref 0.0–2.0)
Bicarbonate: 18.8 mmol/L — ABNORMAL LOW (ref 20.0–28.0)
Calcium, Ion: 1.31 mmol/L (ref 1.15–1.40)
HCT: 34 % — ABNORMAL LOW (ref 36.0–46.0)
Hemoglobin: 11.6 g/dL — ABNORMAL LOW (ref 12.0–15.0)
O2 Saturation: 90 %
Patient temperature: 98.6
Potassium: 4.8 mmol/L (ref 3.5–5.1)
Sodium: 142 mmol/L (ref 135–145)
TCO2: 21 mmol/L — ABNORMAL LOW (ref 22–32)
pCO2 arterial: 66 mmHg (ref 32.0–48.0)
pH, Arterial: 7.062 — CL (ref 7.350–7.450)
pO2, Arterial: 82 mmHg — ABNORMAL LOW (ref 83.0–108.0)

## 2019-06-01 MED ORDER — NITROGLYCERIN IN D5W 200-5 MCG/ML-% IV SOLN
0.0000 ug/min | INTRAVENOUS | Status: DC
  Administered 2019-06-01: 10 ug/min via INTRAVENOUS
  Filled 2019-06-01: qty 250

## 2019-06-01 MED ORDER — FUROSEMIDE 10 MG/ML IJ SOLN
80.0000 mg | Freq: Once | INTRAMUSCULAR | Status: AC
  Administered 2019-06-01: 80 mg via INTRAVENOUS
  Filled 2019-06-01: qty 8

## 2019-06-01 NOTE — ED Provider Notes (Signed)
[facility] EMERGENCY DEPARTMENT Provider Note   CSN: [id number] Arrival date & time: 06/01/19  2308    History   Chief Complaint Chief Complaint  Patient presents with  . Shortness of Breath  Level 5 caveat Due to acuity of condition  HPI [name] [name] is a 68 y.o. female.     The history is provided by the EMS personnel. The history is limited by the condition of the patient.  Shortness of Breath  Severity:  Severe Onset quality:  Sudden Timing:  Constant Progression:  Worsening Chronicity:  New Relieved by:  Nothing Worsened by:  Nothing Patient presents from home via EMS for shortness of breath worse the past day.  EMS reports patient had wheezing.  IT Was reported  she had a fever a few days ago. No other details are known at this time  Past Medical History:  Diagnosis Date  . Anemia 08/2016  . Angiodysplasia of colon   . Arthritis of left shoulder region 03/23/2013  . Chronic combined systolic and diastolic CHF (congestive heart failure) (HCC)    a. EF 40-45%, mild LVH, mid apicalanteroseptal and apical HK.  . CKD (chronic kidney disease), stage III ([place])   . Cocaine abuse ([place])    crack cocaine heavily until 2008 then sporadic use since then  . Coronary artery disease    a. 06/2012 NSTEMI/CABG x 3 (LIMA->LAD, VG->OM2, VG->LCX);  b. 04/2015 MV: EF<30%, mid ant, apicalanterior, apical infarct;  c. 04/2015 Cath: LM nl, LAD 90p, LCX 26m, OM1 min irregs, RCA mild dzs, LIMA->LAD nl w/ dist LAD dzs, VG->OM2 nl, VG->LCX nl-->Med Rx.  . CVA (cerebral infarction)    a. right internal capsule stroke in 12/2006  . Diabetes mellitus    diagnosed in 2008  . Essential hypertension   . Glaucoma   . Gout   . HFrEF (heart failure with reduced ejection fraction) ([place])   . Hyperlipidemia   . Hyperparathyroidism, secondary renal ([place])   . Left-sided sensory deficit present   . Obesity, morbid ([place])   . PVD (peripheral vascular disease) ([place])    a. 06/2012 ABI's:  R - 0.73, L - 0.71.  Marland Kitchen Renal mass, right   . Shortness of breath dyspnea   . Stroke ([place])   . Thyroid nodule    FNA in 6269 showed follicular cells but not definate neoplasm  . Tobacco abuse   . Trichomoniasis     Patient Active Problem List   Diagnosis Date Noted  . Acute on chronic respiratory failure with hypoxia ([place]) 04/27/2019  . Elevated troponin 04/27/2019  . Lobar pneumonia ([place]) 04/27/2019  . Sepsis ([place]) 04/27/2019  . Hyperkalemia 04/27/2019  . Fever   . Symptomatic anemia 09/16/2016  . Diabetes mellitus with complication ([place]) 48/54/6270  . Obesity 04/29/2016  . Hiatal hernia   . Heme + stool   . Absolute anemia   . Bleeding gastrointestinal   . Insulin dependent diabetes mellitus ([place])   . Pain in the chest   . Demand ischemia ([place])   . Iron deficiency anemia   . Precordial pain   . Chest pain 04/17/2016  . Thrombocytosis ([place]) 04/17/2016  . Microcytic anemia 02/05/2016  . Abscess   . Acute systolic heart failure ([place])   . Acute on chronic renal failure ([place])   . CAD in native artery   . Substance abuse ([place])   . Chronic combined systolic and diastolic CHF (congestive heart failure) ([place])   . CKD (chronic  kidney disease) stage 4, GFR 15-29 ml/min (HCC)   . Demand ischemia of myocardium - related to Acue HF exacerbation 05/14/2015  . Obesity, Class III, BMI 40-49.9 (morbid obesity) ([place])   . Acute on chronic combined systolic and diastolic congestive heart failure ([place]) 05/12/2015  . Acute respiratory failure ([place]) 10/18/2014  . Type 2 diabetes mellitus with renal manifestations, controlled ([place]) 10/18/2014  . S/P CABG (coronary artery bypass graft) 09/21/2013  . Arthritis of right shoulder region 03/23/2013  . Tobacco abuse   . Coronary artery disease   . NSTEMI (non-ST elevated myocardial infarction) ([place]) 06/29/2012  . History of cocaine abuse ([place]) 06/29/2012  . AKI (acute kidney injury) ([place]) 06/29/2012  . Essential hypertension 06/29/2012  . RBC  microcytosis 06/29/2012  . Glaucoma   . Hyperlipidemia   . History of CVA (cerebrovascular accident)     Past Surgical History:  Procedure Laterality Date  . CARDIAC CATHETERIZATION    . CARDIAC CATHETERIZATION N/A 05/17/2015   Procedure: Left Heart Cath and Cors/Grafts Angiography;  Surgeon: [name], MD;  Location: [facility];  Service: Cardiovascular;  Laterality: N/A;  . COLONOSCOPY WITH PROPOFOL N/A 04/21/2016   Procedure: COLONOSCOPY WITH PROPOFOL;  Surgeon: Irene Shipper, MD;  Location: [facility];  Service: Endoscopy;  Laterality: N/A;  . CORONARY ARTERY BYPASS GRAFT  07/09/2012   Procedure: CORONARY ARTERY BYPASS GRAFTING (CABG);  Surgeon: Ivin Poot, MD;  Location: [facility];  Service: Open Heart Surgery;  Laterality: N/A;  . ESOPHAGOGASTRODUODENOSCOPY N/A 04/20/2016   Procedure: ESOPHAGOGASTRODUODENOSCOPY (EGD);  Surgeon: Gatha Mayer, MD;  Location[facility] ENDOSCOPY;  Service: Endoscopy;  Laterality: N/A;  . LEFT HEART CATHETERIZATION WITH CORONARY ANGIOGRAM N/A 06/29/2012   Procedure: LEFT HEART CATHETERIZATION WITH CORONARY ANGIOGRAM;  Surgeon: Peter M Martinique, MD;  Location: [facility] CATH LAB;  Service: Cardiovascular;  Laterality: N/A;  . STERNAL WOUND DEBRIDEMENT  08/17/2012   Procedure: STERNAL WOUND DEBRIDEMENT;  Surgeon: Ivin Poot, MD;  Location[facility] OR;  Service: Thoracic;  Laterality: N/A;  wound vac application  . STERNAL WOUND DEBRIDEMENT  08/24/2012   Procedure: STERNAL WOUND DEBRIDEMENT;  Surgeon: Ivin Poot, MD;  Location: [facility];  Service: Thoracic;  Laterality: N/A;  . STERNAL WOUND DEBRIDEMENT  09/01/2012   Procedure: STERNAL WOUND DEBRIDEMENT;  Surgeon: Ivin Poot, MD;  Location: [facility];  Service: Thoracic;  Laterality: N/A;  . STERNAL WOUND DEBRIDEMENT  09/20/2012   Procedure: STERNAL WOUND DEBRIDEMENT;  Surgeon: Ivin Poot, MD;  Location: [facility] OR;  Service: Thoracic;  Laterality: N/A;  wound vac change     OB History   No obstetric history on  file.      Home Medications    Prior to Admission medications   Medication Sig Start Date End Date Taking? Authorizing Provider  allopurinol (ZYLOPRIM) 100 MG tablet Take 100 mg by mouth daily. 11/25/17   [provider]  amLODipine (NORVASC) 5 MG tablet Take 1 tablet (5 mg total) by mouth daily. 04/30/19 04/29/20  [name], MD  aspirin 81 MG tablet Take 81 mg by mouth daily.     [provider]  atorvastatin (LIPITOR) 80 MG tablet Take 1 tablet (80 mg total) by mouth daily at 6 PM. 04/30/19   [name], MD  brinzolamide (AZOPT) 1 % ophthalmic suspension Place 1 drop into both eyes 3 (three) times daily.    [provider]  buPROPion (WELLBUTRIN SR) 150 MG 12 hr tablet Take 1 tablet (150 mg  total) by mouth 2 (two) times daily. 01/16/16   Mikhail, Velta Addison, DO  cetirizine (ZYRTEC) 10 MG tablet Take 10 mg by mouth daily as needed for allergies.     [provider]  ferrous sulfate 325 (65 FE) MG tablet Take 325 mg by mouth 3 (three) times daily with meals.    [provider]  Fluticasone-Salmeterol (ADVAIR) 250-50 MCG/DOSE AEPB Inhale 1 puff into the lungs every 12 (twelve) hours.    [provider]  furosemide (LASIX) 40 MG tablet Take 1 tablet (40 mg total) by mouth daily. Take additional dose if you experience weight gain. Patient taking differently: Take 40-80 mg by mouth See admin instructions. Take 2 tablets in the morning and 1 tablet at bedtime 05/17/15   Cristal Ford, DO  gabapentin (NEURONTIN) 300 MG capsule Take 300 mg by mouth 3 (three) times daily.    [provider]  Insulin Glargine (LANTUS SOLOSTAR) 100 UNIT/ML Solostar Pen Inject 25 Units into the skin daily at 10 pm. Patient taking differently: Inject 21 Units into the skin daily at 10 pm.  01/16/16   Cristal Ford, DO  latanoprost (XALATAN) 0.005 % ophthalmic solution Place 1 drop into both eyes at bedtime.    [provider]  LINZESS 72 MCG capsule Take 72  mcg by mouth daily before breakfast.  11/21/17   [provider]  metolazone (ZAROXOLYN) 5 MG tablet Take 5 mg by mouth daily. 11/06/17   [provider]  nitroGLYCERIN (NITROSTAT) 0.4 MG SL tablet Place 1 tablet (0.4 mg total) under the tongue every 5 (five) minutes as needed for chest pain. 04/30/19   [name], MD  pantoprazole (PROTONIX) 40 MG tablet Take 1 tablet (40 mg total) by mouth daily. 09/23/16   Thurnell Lose, MD  polyethylene glycol (MIRALAX / GLYCOLAX) 17 g packet Take 17 g by mouth daily as needed.    [provider]  PROAIR HFA 108 [phone] Base) MCG/ACT inhaler Inhale 1-2 puffs into the lungs every 6 (six) hours as needed for wheezing.  11/17/17   [provider]    Family History Family History  Problem Relation Age of Onset  . Diabetes Mother   . Hypertension Mother   . Cancer Mother   . Hyperlipidemia Father   . Hypertension Father   . Kidney disease Father   . Gout Father   . Cerebrovascular Accident Father   . Other Other        no known family CAD    Social History Social History   Tobacco Use  . Smoking status: Current Some Day Smoker    Packs/day: 0.25    Years: 50.00    Pack years: 12.50    Types: Cigarettes  . Smokeless tobacco: Never Used  Substance Use Topics  . Alcohol use: No    Alcohol/week: 0.0 standard drinks  . Drug use: Not Currently    Types: Cocaine    Comment: pt denies at current time     Allergies   Naproxen   Review of Systems Review of Systems  Unable to perform ROS: Acuity of condition  Respiratory: Positive for shortness of breath.      Physical Exam Updated Vital Signs BP (!) 202/100 (BP Location: Left Arm)   Pulse (!) 127   Temp 99.8 F (37.7 C) (Rectal)   Resp (!) 26   SpO2 97%   Physical Exam CONSTITUTIONAL: elderly, ill appearing, respiratory distress noted HEAD: Normocephalic/atraumatic EYES: EOMI ENMT: Mucous membranes dry  NECK: supple no meningeal signs  SPINE/BACK:entire spine nontender CV: tachycardic LUNGS: tachypneic, wheezes noted ABDOMEN: soft, nontender,obese NEURO: Pt is awake/alert/appropriate, moves all extremitiesx4.  No facial droop.   EXTREMITIES: pulses normal/equal, full ROM, +LE edema noted bilaterally SKIN: warm, color normal PSYCH: anxious  ED Treatments / Results  Labs (all labs ordered are listed, but only abnormal results are displayed) Labs Reviewed  CBC WITH DIFFERENTIAL/PLATELET - Abnormal; Notable for the following components:      Result Value   Hemoglobin 10.3 (*)    MCH 24.0 (*)    MCHC 28.3 (*)    RDW 18.2 (*)    Platelets 467 (*)    nRBC 0.4 (*)    Abs Immature Granulocytes 0.12 (*)    All other components within normal limits  BASIC METABOLIC PANEL - Abnormal; Notable for the following components:   Chloride 112 (*)    CO2 15 (*)    Glucose, Bld 380 (*)    BUN 37 (*)    Creatinine, Ser 3.42 (*)    GFR calc non Af Amer 13 (*)    GFR calc Af Amer 15 (*)    Anion gap 17 (*)    All other components within normal limits  BRAIN NATRIURETIC PEPTIDE - Abnormal; Notable for the following components:   B Natriuretic Peptide 331.8 (*)    All other components within normal limits  TROPONIN I - Abnormal; Notable for the following components:   Troponin I 0.05 (*)    All other components within normal limits  POCT I-STAT 7, (LYTES, BLD GAS, ICA,H+H) - Abnormal; Notable for the following components:   pH, Arterial 7.062 (*)    pCO2 arterial 66.0 (*)    pO2, Arterial 82.0 (*)    Bicarbonate 18.8 (*)    TCO2 21 (*)    Acid-base deficit 12.0 (*)    HCT 34.0 (*)    Hemoglobin 11.6 (*)    All other components within normal limits  POCT I-STAT 7, (LYTES, BLD GAS, ICA,H+H) - Abnormal; Notable for the following components:   pH, Arterial 7.247 (*)    Bicarbonate 18.9 (*)    TCO2 20 (*)    Acid-base deficit 8.0 (*)    HCT 31.0 (*)    Hemoglobin 10.5 (*)    All other components within normal limits   SARS CORONAVIRUS 2 (HOSPITAL ORDER, [facility])  URINALYSIS, ROUTINE W REFLEX MICROSCOPIC  RAPID URINE DRUG SCREEN, HOSP PERFORMED    EKG EKG Interpretation  Date/Time:  Wednesday June 01 2019 23:13:15 EDT Ventricular Rate:  133 PR Interval:    QRS Duration: 144 QT Interval:  389 QTC Calculation: 579 R Axis:   52 Text Interpretation:  Sinus or ectopic atrial tachycardia Left bundle branch block Abnormal ekg Interpretation limited secondary to artifact Confirmed by Ripley Fraise ([id number]) on 06/01/2019 11:15:49 PM   Radiology Dg Chest Port 1 View  Result Date: 06/01/2019 CLINICAL DATA:  Shortness of breath EXAM: PORTABLE CHEST 1 VIEW COMPARISON:  04/27/2019 FINDINGS: Remote median sternotomy and CABG. There is moderate bilateral interstitial pulmonary edema. No pleural effusion. Moderate cardiomegaly. IMPRESSION: Moderate cardiomegaly and interstitial pulmonary edema. Electronically Signed   By: Ulyses Jarred M.D.   On: 06/01/2019 23:48    Procedures Procedures  CRITICAL CARE Performed by: Sharyon Cable Total critical care time: 45 minutes Critical care time was exclusive of separately billable procedures and treating other patients. Critical care was necessary to treat or prevent imminent  or life-threatening deterioration. Critical care was time spent personally by me on the following activities: development of treatment plan with patient and/or surrogate as well as nursing, discussions with consultants, evaluation of patient's response to treatment, examination of patient, obtaining history from patient or surrogate, ordering and performing treatments and interventions, ordering and review of laboratory studies, ordering and review of radiographic studies, pulse oximetry and re-evaluation of patient's condition. Patient with respiratory failure requiring BiPAP, nitroglycerin  Medications Ordered in ED Medications  nitroGLYCERIN 50 mg in dextrose 5 %  250 mL (0.2 mg/mL) infusion (10 mcg/min Intravenous New Bag/Given 06/01/19 2334)  furosemide (LASIX) injection 80 mg (80 mg Intravenous Given 06/01/19 2335)     Initial Impression / Assessment and Plan / ED Course  I have reviewed the triage vital signs and the nursing notes.  Pertinent labs & imaging results that were available during my care of the patient were reviewed by me and considered in my medical decision making (see chart for details).        11:47 PM Seen on arrival for respiratory distress.  She was hypoxic, but this is improved.  Will monitor closely, strong suspicion is component of acute CHF.  Nitroglycerin and Lasix in order.  If possible we will hold off intubation until COVID test returned.  She would be benefit from BiPAP 11:52 PM Discussed the case with her husband.  He reports this is similar to her last admission.  He reports that she does smoke crack, smokes cigarettes and has asthma.  He reports she did not smoke crack tonight 12:08 AM Pt improved, respiratory effort is improved and BP improved 2:36 AM Overall patient is improved.  Hypercapnia is resolving.  She is tolerating BiPAP as COVID negative, is easily arousable and in no distress.  Vitals are appropriate. Discussed with Dr. Myna Hidalgo for admission I have updated the husband on the plan via phone   [name] [name] was evaluated in Emergency Department on 06/01/2019 for the symptoms described in the history of present illness. She was evaluated in the context of the global COVID-19 pandemic, which necessitated consideration that the patient might be at risk for infection with the SARS-CoV-2 virus that causes COVID-19. Institutional protocols and algorithms that pertain to the evaluation of patients at risk for COVID-19 are in a state of rapid change based on information released by regulatory bodies including the CDC and federal and state organizations. These policies and algorithms were followed during the patient's care  in the ED.   Final Clinical Impressions(s) / ED Diagnoses   Final diagnoses:  Acute respiratory failure with hypoxia and hypercapnia ([place])  Acute pulmonary edema (HCC)  AKI (acute kidney injury) [facility])    ED Discharge Orders    None       Ripley Fraise, MD 06/02/19 [phone]

## 2019-06-01 NOTE — ED Triage Notes (Signed)
Pt presents to ED by [facility] from home c/o SOB worsening today. Per EMS wheezes in the upper lobes. Clearly dyspneic at rest. Per family pt had a fever a few days ago. Denies pain. Hx asthma, COPD, cigarette smoker.

## 2019-06-01 NOTE — Progress Notes (Signed)
Arterial results given to Dr.Wickline.

## 2019-06-02 ENCOUNTER — Encounter (HOSPITAL_COMMUNITY): Payer: Self-pay | Admitting: Family Medicine

## 2019-06-02 ENCOUNTER — Other Ambulatory Visit: Payer: Self-pay

## 2019-06-02 DIAGNOSIS — I2510 Atherosclerotic heart disease of native coronary artery without angina pectoris: Secondary | ICD-10-CM | POA: Diagnosis present

## 2019-06-02 DIAGNOSIS — J810 Acute pulmonary edema: Secondary | ICD-10-CM | POA: Diagnosis present

## 2019-06-02 DIAGNOSIS — Z6841 Body Mass Index (BMI) 40.0 and over, adult: Secondary | ICD-10-CM | POA: Diagnosis not present

## 2019-06-02 DIAGNOSIS — Z8673 Personal history of transient ischemic attack (TIA), and cerebral infarction without residual deficits: Secondary | ICD-10-CM | POA: Diagnosis not present

## 2019-06-02 DIAGNOSIS — I10 Essential (primary) hypertension: Secondary | ICD-10-CM

## 2019-06-02 DIAGNOSIS — I5043 Acute on chronic combined systolic (congestive) and diastolic (congestive) heart failure: Secondary | ICD-10-CM

## 2019-06-02 DIAGNOSIS — H409 Unspecified glaucoma: Secondary | ICD-10-CM | POA: Diagnosis present

## 2019-06-02 DIAGNOSIS — Z794 Long term (current) use of insulin: Secondary | ICD-10-CM

## 2019-06-02 DIAGNOSIS — R7989 Other specified abnormal findings of blood chemistry: Secondary | ICD-10-CM

## 2019-06-02 DIAGNOSIS — Z20828 Contact with and (suspected) exposure to other viral communicable diseases: Secondary | ICD-10-CM | POA: Diagnosis present

## 2019-06-02 DIAGNOSIS — J9601 Acute respiratory failure with hypoxia: Secondary | ICD-10-CM | POA: Diagnosis present

## 2019-06-02 DIAGNOSIS — J9602 Acute respiratory failure with hypercapnia: Secondary | ICD-10-CM | POA: Diagnosis present

## 2019-06-02 DIAGNOSIS — I5023 Acute on chronic systolic (congestive) heart failure: Secondary | ICD-10-CM | POA: Diagnosis not present

## 2019-06-02 DIAGNOSIS — I441 Atrioventricular block, second degree: Secondary | ICD-10-CM | POA: Diagnosis present

## 2019-06-02 DIAGNOSIS — I130 Hypertensive heart and chronic kidney disease with heart failure and stage 1 through stage 4 chronic kidney disease, or unspecified chronic kidney disease: Secondary | ICD-10-CM | POA: Diagnosis present

## 2019-06-02 DIAGNOSIS — F1910 Other psychoactive substance abuse, uncomplicated: Secondary | ICD-10-CM | POA: Diagnosis not present

## 2019-06-02 DIAGNOSIS — J449 Chronic obstructive pulmonary disease, unspecified: Secondary | ICD-10-CM | POA: Diagnosis present

## 2019-06-02 DIAGNOSIS — N179 Acute kidney failure, unspecified: Secondary | ICD-10-CM | POA: Diagnosis present

## 2019-06-02 DIAGNOSIS — Z951 Presence of aortocoronary bypass graft: Secondary | ICD-10-CM | POA: Diagnosis not present

## 2019-06-02 DIAGNOSIS — I161 Hypertensive emergency: Secondary | ICD-10-CM | POA: Diagnosis present

## 2019-06-02 DIAGNOSIS — E1121 Type 2 diabetes mellitus with diabetic nephropathy: Secondary | ICD-10-CM

## 2019-06-02 DIAGNOSIS — N2581 Secondary hyperparathyroidism of renal origin: Secondary | ICD-10-CM | POA: Diagnosis present

## 2019-06-02 DIAGNOSIS — M109 Gout, unspecified: Secondary | ICD-10-CM | POA: Diagnosis present

## 2019-06-02 DIAGNOSIS — F1410 Cocaine abuse, uncomplicated: Secondary | ICD-10-CM | POA: Diagnosis present

## 2019-06-02 DIAGNOSIS — I248 Other forms of acute ischemic heart disease: Secondary | ICD-10-CM | POA: Diagnosis present

## 2019-06-02 DIAGNOSIS — I169 Hypertensive crisis, unspecified: Secondary | ICD-10-CM

## 2019-06-02 DIAGNOSIS — Z87891 Personal history of nicotine dependence: Secondary | ICD-10-CM | POA: Diagnosis not present

## 2019-06-02 DIAGNOSIS — E785 Hyperlipidemia, unspecified: Secondary | ICD-10-CM | POA: Diagnosis present

## 2019-06-02 DIAGNOSIS — N184 Chronic kidney disease, stage 4 (severe): Secondary | ICD-10-CM | POA: Diagnosis present

## 2019-06-02 DIAGNOSIS — I252 Old myocardial infarction: Secondary | ICD-10-CM | POA: Diagnosis not present

## 2019-06-02 HISTORY — DX: Hypertensive crisis, unspecified: I16.9

## 2019-06-02 LAB — BASIC METABOLIC PANEL
Anion gap: 17 — ABNORMAL HIGH (ref 5–15)
Anion gap: 11 (ref 5–15)
BUN: 37 mg/dL — ABNORMAL HIGH (ref 8–23)
BUN: 41 mg/dL — ABNORMAL HIGH (ref 8–23)
CO2: 15 mmol/L — ABNORMAL LOW (ref 22–32)
CO2: 20 mmol/L — ABNORMAL LOW (ref 22–32)
Calcium: 9.5 mg/dL (ref 8.9–10.3)
Calcium: 9.3 mg/dL (ref 8.9–10.3)
Chloride: 112 mmol/L — ABNORMAL HIGH (ref 98–111)
Chloride: 111 mmol/L (ref 98–111)
Creatinine, Ser: 3.42 mg/dL — ABNORMAL HIGH (ref 0.44–1.00)
Creatinine, Ser: 3.34 mg/dL — ABNORMAL HIGH (ref 0.44–1.00)
GFR calc Af Amer: 15 mL/min — ABNORMAL LOW (ref >60)
GFR calc Af Amer: 16 mL/min — ABNORMAL LOW (ref >60)
GFR calc non Af Amer: 13 mL/min — ABNORMAL LOW (ref >60)
GFR calc non Af Amer: 13 mL/min — ABNORMAL LOW (ref >60)
Glucose, Bld: 380 mg/dL — ABNORMAL HIGH (ref 70–99)
Glucose, Bld: 257 mg/dL — ABNORMAL HIGH (ref 70–99)
Potassium: 4.8 mmol/L (ref 3.5–5.1)
Potassium: 4.7 mmol/L (ref 3.5–5.1)
Sodium: 144 mmol/L (ref 135–145)
Sodium: 142 mmol/L (ref 135–145)

## 2019-06-02 LAB — GLUCOSE, CAPILLARY
Glucose-Capillary: 290 mg/dL — ABNORMAL HIGH (ref 70–99)
Glucose-Capillary: 276 mg/dL — ABNORMAL HIGH (ref 70–99)
Glucose-Capillary: 264 mg/dL — ABNORMAL HIGH (ref 70–99)
Glucose-Capillary: 205 mg/dL — ABNORMAL HIGH (ref 70–99)
Glucose-Capillary: 138 mg/dL — ABNORMAL HIGH (ref 70–99)

## 2019-06-02 LAB — CBC WITH DIFFERENTIAL/PLATELET
Abs Immature Granulocytes: 0.08 10*3/uL — ABNORMAL HIGH (ref 0.00–0.07)
Basophils Absolute: 0 10*3/uL (ref 0.0–0.1)
Basophils Relative: 0 %
Eosinophils Absolute: 0 10*3/uL (ref 0.0–0.5)
Eosinophils Relative: 0 %
HCT: 31.5 % — ABNORMAL LOW (ref 36.0–46.0)
Hemoglobin: 9.4 g/dL — ABNORMAL LOW (ref 12.0–15.0)
Immature Granulocytes: 1 %
Lymphocytes Relative: 3 %
Lymphs Abs: 0.5 10*3/uL — ABNORMAL LOW (ref 0.7–4.0)
MCH: 24.5 pg — ABNORMAL LOW (ref 26.0–34.0)
MCHC: 29.8 g/dL — ABNORMAL LOW (ref 30.0–36.0)
MCV: 82 fL (ref 80.0–100.0)
Monocytes Absolute: 0.2 10*3/uL (ref 0.1–1.0)
Monocytes Relative: 1 %
Neutro Abs: 15.3 10*3/uL — ABNORMAL HIGH (ref 1.7–7.7)
Neutrophils Relative %: 95 %
Platelets: 340 10*3/uL (ref 150–400)
RBC: 3.84 MIL/uL — ABNORMAL LOW (ref 3.87–5.11)
RDW: 18.1 % — ABNORMAL HIGH (ref 11.5–15.5)
WBC: 16.1 10*3/uL — ABNORMAL HIGH (ref 4.0–10.5)
nRBC: 0 % (ref 0.0–0.2)

## 2019-06-02 LAB — POCT I-STAT 7, (LYTES, BLD GAS, ICA,H+H)
Acid-base deficit: 8 mmol/L — ABNORMAL HIGH (ref 0.0–2.0)
Bicarbonate: 18.9 mmol/L — ABNORMAL LOW (ref 20.0–28.0)
Calcium, Ion: 1.27 mmol/L (ref 1.15–1.40)
HCT: 31 % — ABNORMAL LOW (ref 36.0–46.0)
Hemoglobin: 10.5 g/dL — ABNORMAL LOW (ref 12.0–15.0)
O2 Saturation: 96 %
Patient temperature: 99.8
Potassium: 5 mmol/L (ref 3.5–5.1)
Sodium: 142 mmol/L (ref 135–145)
TCO2: 20 mmol/L — ABNORMAL LOW (ref 22–32)
pCO2 arterial: 43.8 mmHg (ref 32.0–48.0)
pH, Arterial: 7.247 — ABNORMAL LOW (ref 7.350–7.450)
pO2, Arterial: 94 mmHg (ref 83.0–108.0)

## 2019-06-02 LAB — TROPONIN I
Troponin I: 0.05 ng/mL (ref <0.03)
Troponin I: 0.18 ng/mL (ref <0.03)
Troponin I: 0.19 ng/mL (ref <0.03)
Troponin I: 0.22 ng/mL (ref <0.03)

## 2019-06-02 LAB — SARS CORONAVIRUS 2 BY RT PCR (HOSPITAL ORDER, PERFORMED IN ~~LOC~~ HOSPITAL LAB): SARS Coronavirus 2: NEGATIVE

## 2019-06-02 LAB — BRAIN NATRIURETIC PEPTIDE: B Natriuretic Peptide: 331.8 pg/mL — ABNORMAL HIGH (ref 0.0–100.0)

## 2019-06-02 MED ORDER — NITROGLYCERIN 0.4 MG SL SUBL: 0.4000 mg | SUBLINGUAL_TABLET | SUBLINGUAL | Status: DC | PRN

## 2019-06-02 MED ORDER — PANTOPRAZOLE SODIUM 40 MG PO TBEC
40.0000 mg | DELAYED_RELEASE_TABLET | Freq: Every day | ORAL | Status: DC
  Administered 2019-06-02 – 2019-06-03 (×2): 40 mg via ORAL
  Filled 2019-06-02 (×2): qty 1

## 2019-06-02 MED ORDER — ATORVASTATIN CALCIUM 40 MG PO TABS
40.0000 mg | ORAL_TABLET | Freq: Every day | ORAL | Status: DC
  Administered 2019-06-02: 40 mg via ORAL
  Filled 2019-06-02: qty 1

## 2019-06-02 MED ORDER — ONDANSETRON HCL 4 MG/2ML IJ SOLN: 4.0000 mg | Freq: Four times a day (QID) | INTRAMUSCULAR | Status: DC | PRN

## 2019-06-02 MED ORDER — BUPROPION HCL ER (SR) 150 MG PO TB12
150.0000 mg | ORAL_TABLET | Freq: Two times a day (BID) | ORAL | Status: DC
  Administered 2019-06-02 – 2019-06-03 (×2): 150 mg via ORAL
  Filled 2019-06-02 (×3): qty 1

## 2019-06-02 MED ORDER — FUROSEMIDE 10 MG/ML IJ SOLN
80.0000 mg | Freq: Two times a day (BID) | INTRAMUSCULAR | Status: DC
  Administered 2019-06-02 – 2019-06-03 (×2): 80 mg via INTRAVENOUS
  Filled 2019-06-02 (×2): qty 8

## 2019-06-02 MED ORDER — NITROGLYCERIN IN D5W 200-5 MCG/ML-% IV SOLN
0.0000 ug/min | INTRAVENOUS | Status: DC
  Administered 2019-06-02: 2 ug/min via INTRAVENOUS
  Filled 2019-06-02: qty 250

## 2019-06-02 MED ORDER — SODIUM CHLORIDE 0.9 % IV SOLN: 250.0000 mL | INTRAVENOUS | Status: DC | PRN

## 2019-06-02 MED ORDER — INSULIN ASPART 100 UNIT/ML ~~LOC~~ SOLN
0.0000 [IU] | Freq: Three times a day (TID) | SUBCUTANEOUS | Status: DC
  Administered 2019-06-02: 5 [IU] via SUBCUTANEOUS
  Administered 2019-06-02: 3 [IU] via SUBCUTANEOUS
  Administered 2019-06-02: 5 [IU] via SUBCUTANEOUS
  Administered 2019-06-03: 1 [IU] via SUBCUTANEOUS
  Administered 2019-06-03: 2 [IU] via SUBCUTANEOUS

## 2019-06-02 MED ORDER — MOMETASONE FURO-FORMOTEROL FUM 200-5 MCG/ACT IN AERO
2.0000 | INHALATION_SPRAY | Freq: Two times a day (BID) | RESPIRATORY_TRACT | Status: DC
  Administered 2019-06-02 – 2019-06-03 (×3): 2 via RESPIRATORY_TRACT
  Filled 2019-06-02: qty 8.8

## 2019-06-02 MED ORDER — GABAPENTIN 300 MG PO CAPS
300.0000 mg | ORAL_CAPSULE | Freq: Three times a day (TID) | ORAL | Status: DC
  Administered 2019-06-02 – 2019-06-03 (×3): 300 mg via ORAL
  Filled 2019-06-02 (×3): qty 1

## 2019-06-02 MED ORDER — HEPARIN SODIUM (PORCINE) 5000 UNIT/ML IJ SOLN
5000.0000 [IU] | Freq: Three times a day (TID) | INTRAMUSCULAR | Status: DC
  Administered 2019-06-02 – 2019-06-03 (×3): 5000 [IU] via SUBCUTANEOUS
  Filled 2019-06-02 (×3): qty 1

## 2019-06-02 MED ORDER — ALLOPURINOL 100 MG PO TABS
100.0000 mg | ORAL_TABLET | Freq: Every day | ORAL | Status: DC
  Administered 2019-06-03: 100 mg via ORAL
  Filled 2019-06-02: qty 1

## 2019-06-02 MED ORDER — INSULIN GLARGINE 100 UNIT/ML ~~LOC~~ SOLN
15.0000 [IU] | Freq: Every day | SUBCUTANEOUS | Status: DC
  Filled 2019-06-02: qty 0.15

## 2019-06-02 MED ORDER — ASPIRIN EC 325 MG PO TBEC
325.0000 mg | DELAYED_RELEASE_TABLET | Freq: Every day | ORAL | Status: DC
  Administered 2019-06-03: 325 mg via ORAL
  Filled 2019-06-02: qty 1

## 2019-06-02 MED ORDER — BRINZOLAMIDE 1 % OP SUSP
1.0000 [drp] | Freq: Three times a day (TID) | OPHTHALMIC | Status: DC
  Administered 2019-06-02 – 2019-06-03 (×2): 1 [drp] via OPHTHALMIC
  Filled 2019-06-02: qty 10

## 2019-06-02 MED ORDER — ATORVASTATIN CALCIUM 80 MG PO TABS: 80.0000 mg | ORAL_TABLET | Freq: Every day | ORAL | Status: DC

## 2019-06-02 MED ORDER — PRAVASTATIN SODIUM 40 MG PO TABS
40.0000 mg | ORAL_TABLET | Freq: Every day | ORAL | Status: DC
  Administered 2019-06-02: 40 mg via ORAL
  Filled 2019-06-02: qty 1

## 2019-06-02 MED ORDER — ASPIRIN EC 81 MG PO TBEC
81.0000 mg | DELAYED_RELEASE_TABLET | Freq: Every day | ORAL | Status: DC
  Administered 2019-06-02: 81 mg via ORAL
  Filled 2019-06-02: qty 1

## 2019-06-02 MED ORDER — ALBUTEROL SULFATE (2.5 MG/3ML) 0.083% IN NEBU: 2.5000 mg | INHALATION_SOLUTION | Freq: Four times a day (QID) | RESPIRATORY_TRACT | Status: DC | PRN

## 2019-06-02 MED ORDER — POLYETHYLENE GLYCOL 3350 17 G PO PACK: 17.0000 g | PACK | Freq: Every day | ORAL | Status: DC | PRN

## 2019-06-02 MED ORDER — FERROUS SULFATE 325 (65 FE) MG PO TABS
325.0000 mg | ORAL_TABLET | Freq: Three times a day (TID) | ORAL | Status: DC
  Administered 2019-06-02 – 2019-06-03 (×3): 325 mg via ORAL
  Filled 2019-06-02 (×3): qty 1

## 2019-06-02 MED ORDER — LATANOPROST 0.005 % OP SOLN
1.0000 [drp] | Freq: Every day | OPHTHALMIC | Status: DC
  Administered 2019-06-02: 1 [drp] via OPHTHALMIC
  Filled 2019-06-02: qty 2.5

## 2019-06-02 MED ORDER — SODIUM CHLORIDE 0.9% FLUSH: 3.0000 mL | INTRAVENOUS | Status: DC | PRN

## 2019-06-02 MED ORDER — AMLODIPINE BESYLATE 5 MG PO TABS
5.0000 mg | ORAL_TABLET | Freq: Every day | ORAL | Status: DC
  Administered 2019-06-02 – 2019-06-03 (×2): 5 mg via ORAL
  Filled 2019-06-02 (×2): qty 1

## 2019-06-02 MED ORDER — SODIUM CHLORIDE 0.9% FLUSH
3.0000 mL | Freq: Two times a day (BID) | INTRAVENOUS | Status: DC
  Administered 2019-06-02 – 2019-06-03 (×3): 3 mL via INTRAVENOUS

## 2019-06-02 MED ORDER — ACETAMINOPHEN 325 MG PO TABS: 650.0000 mg | ORAL_TABLET | ORAL | Status: DC | PRN

## 2019-06-02 MED ORDER — ORAL CARE MOUTH RINSE
15.0000 mL | Freq: Two times a day (BID) | OROMUCOSAL | Status: DC
  Administered 2019-06-02 – 2019-06-03 (×3): 15 mL via OROMUCOSAL

## 2019-06-02 MED ORDER — INSULIN ASPART 100 UNIT/ML ~~LOC~~ SOLN: 0.0000 [IU] | Freq: Every day | SUBCUTANEOUS | Status: DC

## 2019-06-02 MED ORDER — INSULIN GLARGINE 100 UNIT/ML ~~LOC~~ SOLN
18.0000 [IU] | Freq: Every day | SUBCUTANEOUS | Status: DC
  Administered 2019-06-02: 18 [IU] via SUBCUTANEOUS
  Filled 2019-06-02 (×2): qty 0.18

## 2019-06-02 MED ORDER — ASPIRIN EC 325 MG PO TBEC: 325.0000 mg | DELAYED_RELEASE_TABLET | Freq: Every day | ORAL | Status: DC

## 2019-06-02 MED ORDER — LINACLOTIDE 72 MCG PO CAPS
72.0000 ug | ORAL_CAPSULE | Freq: Every day | ORAL | Status: DC
  Administered 2019-06-03: 72 ug via ORAL
  Filled 2019-06-02: qty 1

## 2019-06-02 MED ORDER — CHLORHEXIDINE GLUCONATE 0.12 % MT SOLN
15.0000 mL | Freq: Two times a day (BID) | OROMUCOSAL | Status: DC
  Administered 2019-06-02 – 2019-06-03 (×3): 15 mL via OROMUCOSAL
  Filled 2019-06-02 (×2): qty 15

## 2019-06-02 NOTE — H&P (Signed)
History and Physical    KATERRA INGMAN SWF:[id number] DOB: February 26, 1951 DOA: 06/01/2019  PCP: [facility]   Patient coming from: Home   Chief Complaint: Acute respiratory distress   HPI: [name] [name] is a 68 y.o. female with medical history significant for CAD status post CABG, chronic combined systolic and diastolic CHF, insulin-dependent diabetes mellitus, COPD, cocaine abuse, and chronic kidney disease stage IV, now presenting to the emergency department with acute respiratory distress.  Patient is only able to speak a couple words at a time due to her acute respiratory distress and her ability to provide history is limited by this.  [name] [name] appeared to be struggling to breathe at rest at home, this seemed to worsen rapidly, and EMS was called.  The patient denies chest pain.  Patient denies fevers or chills but her husband thought that [name] [name] may have had a fever a few days ago. Her husband did not believe that [name] [name] had smoked any cocaine tonight and the patient herself denies any use tonight.  [name] [name] was admitted to the hospital just over a month ago with non-STEMI, was not a candidate for catheterization due to ongoing cocaine abuse, and has been medically managed.  ED Course: Upon arrival to the ED, patient is found to be afebrile, saturating 90% on BiPAP, tachypneic in the low 30s, tachycardic to 130, and initial blood pressure 200/100.  EKG features a sinus or ectopic atrial tachycardia with rate 133 and LBBB.  Chest x-ray is notable for moderate cardiomegaly and interstitial pulmonary edema.  Chemistry panel features a bicarbonate of 15, creatinine of 3.42, and glucose 380.  CBC is notable for hemoglobin of 10.3, increased from prior.  Troponin is 0.05 and BNP 332.  Patient was given 80 mg IV Lasix, started on nitroglycerin infusion, and placed on BiPAP.  Her condition improved dramatically with these measures but [name] [name] remains dyspneic at rest and will require ongoing evaluation and  management.  Review of Systems:  All other systems reviewed and apart from HPI, are negative.  Past Medical History:  Diagnosis Date   Anemia 08/2016   Angiodysplasia of colon    Arthritis of left shoulder region 03/23/2013   Chronic combined systolic and diastolic CHF (congestive heart failure) (HCC)    a. EF 40-45%, mild LVH, mid apicalanteroseptal and apical HK.   CKD (chronic kidney disease), stage III ([place])    Cocaine abuse ([place])    crack cocaine heavily until 2008 then sporadic use since then   Coronary artery disease    a. 06/2012 NSTEMI/CABG x 3 (LIMA->LAD, VG->OM2, VG->LCX);  b. 04/2015 MV: EF<30%, mid ant, apicalanterior, apical infarct;  c. 04/2015 Cath: LM nl, LAD 90p, LCX 9m, OM1 min irregs, RCA mild dzs, LIMA->LAD nl w/ dist LAD dzs, VG->OM2 nl, VG->LCX nl-->Med Rx.   CVA (cerebral infarction)    a. right internal capsule stroke in 12/2006   Diabetes mellitus    diagnosed in 2008   Essential hypertension    Glaucoma    Gout    HFrEF (heart failure with reduced ejection fraction) (HCC)    Hyperlipidemia    Hyperparathyroidism, secondary renal (HCC)    Left-sided sensory deficit present    Obesity, morbid ([place])    PVD (peripheral vascular disease) ([place])    a. 06/2012 ABI's: R - 0.73, L - 0.71.   Renal mass, right    Shortness of breath dyspnea    Stroke [facility])    Thyroid nodule  FNA in 6314 showed follicular cells but not definate neoplasm   Tobacco abuse    Trichomoniasis     Past Surgical History:  Procedure Laterality Date   CARDIAC CATHETERIZATION     CARDIAC CATHETERIZATION N/A 05/17/2015   Procedure: Left Heart Cath and Cors/Grafts Angiography;  Surgeon: [name], MD;  Location: [facility];  Service: Cardiovascular;  Laterality: N/A;   COLONOSCOPY WITH PROPOFOL N/A 04/21/2016   Procedure: COLONOSCOPY WITH PROPOFOL;  Surgeon: Irene Shipper, MD;  Location: [facility];  Service: Endoscopy;  Laterality: N/A;   CORONARY  ARTERY BYPASS GRAFT  07/09/2012   Procedure: CORONARY ARTERY BYPASS GRAFTING (CABG);  Surgeon: Ivin Poot, MD;  Location: [facility];  Service: Open Heart Surgery;  Laterality: N/A;   ESOPHAGOGASTRODUODENOSCOPY N/A 04/20/2016   Procedure: ESOPHAGOGASTRODUODENOSCOPY (EGD);  Surgeon: Gatha Mayer, MD;  Location: [facility]OSCOPY;  Service: Endoscopy;  Laterality: N/A;   LEFT HEART CATHETERIZATION WITH CORONARY ANGIOGRAM N/A 06/29/2012   Procedure: LEFT HEART CATHETERIZATION WITH CORONARY ANGIOGRAM;  Surgeon: Peter M Martinique, MD;  Location: [facility];  Service: Cardiovascular;  Laterality: N/A;   STERNAL WOUND DEBRIDEMENT  08/17/2012   Procedure: STERNAL WOUND DEBRIDEMENT;  Surgeon: Ivin Poot, MD;  Location: [facility];  Service: Thoracic;  Laterality: N/A;  wound vac application   STERNAL WOUND DEBRIDEMENT  08/24/2012   Procedure: STERNAL WOUND DEBRIDEMENT;  Surgeon: Ivin Poot, MD;  Location: [facility];  Service: Thoracic;  Laterality: N/A;   STERNAL WOUND DEBRIDEMENT  09/01/2012   Procedure: STERNAL WOUND DEBRIDEMENT;  Surgeon: Ivin Poot, MD;  Location: [facility];  Service: Thoracic;  Laterality: N/A;   STERNAL WOUND DEBRIDEMENT  09/20/2012   Procedure: STERNAL WOUND DEBRIDEMENT;  Surgeon: Ivin Poot, MD;  Location: [facility];  Service: Thoracic;  Laterality: N/A;  wound vac change     reports that [name] [name] has been smoking cigarettes. [name] [name] has a 12.50 pack-year smoking history. [name] [name] has never used smokeless tobacco. [name] [name] reports previous drug use. Drug: Cocaine. [name] [name] reports that [name] [name] does not drink alcohol.  Allergies  Allergen Reactions   Naproxen Rash    Family History  Problem Relation Age of Onset   Diabetes Mother    Hypertension Mother    Cancer Mother    Hyperlipidemia Father    Hypertension Father    Kidney disease Father    Gout Father    Cerebrovascular Accident Father    Other Other        no known family CAD     Prior to Admission medications   Medication Sig  Start Date End Date Taking? Authorizing Provider  allopurinol (ZYLOPRIM) 100 MG tablet Take 100 mg by mouth daily. 11/25/17   [provider]  amLODipine (NORVASC) 5 MG tablet Take 1 tablet (5 mg total) by mouth daily. 04/30/19 04/29/20  [name], MD  aspirin 81 MG tablet Take 81 mg by mouth daily.     [provider]  atorvastatin (LIPITOR) 80 MG tablet Take 1 tablet (80 mg total) by mouth daily at 6 PM. 04/30/19   [name], MD  brinzolamide (AZOPT) 1 % ophthalmic suspension Place 1 drop into both eyes 3 (three) times daily.    [provider]  buPROPion (WELLBUTRIN SR) 150 MG 12 hr tablet Take 1 tablet (150 mg total) by mouth 2 (two) times daily. 01/16/16   Mikhail, Velta Addison, DO  cetirizine (ZYRTEC) 10 MG tablet Take 10 mg by mouth daily as needed for allergies.  [provider]  ferrous sulfate 325 (65 FE) MG tablet Take 325 mg by mouth 3 (three) times daily with meals.    [provider]  Fluticasone-Salmeterol (ADVAIR) 250-50 MCG/DOSE AEPB Inhale 1 puff into the lungs every 12 (twelve) hours.    [provider]  furosemide (LASIX) 40 MG tablet Take 1 tablet (40 mg total) by mouth daily. Take additional dose if you experience weight gain. Patient taking differently: Take 40-80 mg by mouth See admin instructions. Take 2 tablets in the morning and 1 tablet at bedtime 05/17/15   Cristal Ford, DO  gabapentin (NEURONTIN) 300 MG capsule Take 300 mg by mouth 3 (three) times daily.    [provider]  Insulin Glargine (LANTUS SOLOSTAR) 100 UNIT/ML Solostar Pen Inject 25 Units into the skin daily at 10 pm. Patient taking differently: Inject 21 Units into the skin daily at 10 pm.  01/16/16   Cristal Ford, DO  latanoprost (XALATAN) 0.005 % ophthalmic solution Place 1 drop into both eyes at bedtime.    [provider]  LINZESS 72 MCG capsule Take 72 mcg by mouth daily before breakfast.  11/21/17   [provider]  metolazone  (ZAROXOLYN) 5 MG tablet Take 5 mg by mouth daily. 11/06/17   [provider]  nitroGLYCERIN (NITROSTAT) 0.4 MG SL tablet Place 1 tablet (0.4 mg total) under the tongue every 5 (five) minutes as needed for chest pain. 04/30/19   [name], MD  pantoprazole (PROTONIX) 40 MG tablet Take 1 tablet (40 mg total) by mouth daily. 09/23/16   Thurnell Lose, MD  polyethylene glycol (MIRALAX / GLYCOLAX) 17 g packet Take 17 g by mouth daily as needed.    [provider]  PROAIR HFA 108 [phone] Base) MCG/ACT inhaler Inhale 1-2 puffs into the lungs every 6 (six) hours as needed for wheezing.  11/17/17   [provider]    Physical Exam: Vitals:   06/02/19 0200 06/02/19 0215 06/02/19 0230 06/02/19 0300  BP: (!) 142/67 133/74 (!) 145/80   Pulse: 88 86 88   Resp: 20  20 (!) 22  Temp:      TempSrc:      SpO2: 99% 99% 100%     Constitutional: Tachypnea, no diaphoresis  Eyes: PERTLA, lids and conjunctivae normal ENMT: Mucous membranes are moist. Posterior pharynx clear of any exudate or lesions.   Neck: normal, supple, no masses, no thyromegaly Respiratory: Tachypneic. Speaking 2 words at a time. Increased WOB. No pallor or cyanosis.  Cardiovascular: Rate ~100 and regular. Pretibial pitting edema bilaterally.  Abdomen: No distension, no tenderness, soft. Bowel sounds normal.  Musculoskeletal: no clubbing / cyanosis. No joint deformity upper and lower extremities.  Skin: no significant rashes, lesions, ulcers. Warm, dry, well-perfused. Neurologic: No gross facial asymmetry. Sensation intact. Moving all extremities.     Labs on Admission: I have personally reviewed following labs and imaging studies  CBC: Recent Labs  Lab 06/01/19 2316 06/01/19 2322 06/02/19 0135  WBC  --  9.9  --   NEUTROABS  --  5.2  --   HGB 11.6* 10.3* 10.5*  HCT 34.0* 36.4 31.0*  MCV  --  84.7  --   PLT  --  467*  --    Basic Metabolic Panel: Recent Labs  Lab 06/01/19 2316 06/01/19 2322  06/02/19 0135  NA 142 144 142  K 4.8 4.8 5.0  CL  --  112*  --   CO2  --  15*  --  GLUCOSE  --  380*  --   BUN  --  37*  --   CREATININE  --  3.42*  --   CALCIUM  --  9.5  --    GFR: Estimated Creatinine Clearance: 22.5 mL/min (A) (by C-G formula based on SCr of 3.42 mg/dL (H)). Liver Function Tests: No results for input(s): AST, ALT, ALKPHOS, BILITOT, PROT, ALBUMIN in the last 168 hours. No results for input(s): LIPASE, AMYLASE in the last 168 hours. No results for input(s): AMMONIA in the last 168 hours. Coagulation Profile: No results for input(s): INR, PROTIME in the last 168 hours. Cardiac Enzymes: Recent Labs  Lab 06/01/19 2322  TROPONINI 0.05*   BNP (last 3 results) No results for input(s): PROBNP in the last 8760 hours. HbA1C: No results for input(s): HGBA1C in the last 72 hours. CBG: Recent Labs  Lab 06/02/19 0346  GLUCAP 290*   Lipid Profile: No results for input(s): CHOL, HDL, LDLCALC, TRIG, CHOLHDL, LDLDIRECT in the last 72 hours. Thyroid Function Tests: No results for input(s): TSH, T4TOTAL, FREET4, T3FREE, THYROIDAB in the last 72 hours. Anemia Panel: No results for input(s): VITAMINB12, FOLATE, FERRITIN, TIBC, IRON, RETICCTPCT in the last 72 hours. Urine analysis:    Component Value Date/Time   COLORURINE YELLOW 04/28/2019 0712   APPEARANCEUR HAZY (A) 04/28/2019 0712   LABSPEC 1.015 04/28/2019 0712   PHURINE 5.0 04/28/2019 0712   GLUCOSEU NEGATIVE 04/28/2019 0712   HGBUR NEGATIVE 04/28/2019 0712   BILIRUBINUR NEGATIVE 04/28/2019 0712   KETONESUR NEGATIVE 04/28/2019 0712   PROTEINUR 100 (A) 04/28/2019 0712   UROBILINOGEN 0.2 05/12/2015 0938   NITRITE NEGATIVE 04/28/2019 0712   LEUKOCYTESUR NEGATIVE 04/28/2019 3903   Sepsis Labs: @LABRCNTIP (procalcitonin:4,lacticidven:4) ) Recent Results (from the past 240 hour(s))  SARS Coronavirus 2 (CEPHEID - Performed in [facility] hospital lab), Hosp Order     Status: None   Collection Time: 06/01/19  11:20 PM  Result Value Ref Range Status   SARS Coronavirus 2 NEGATIVE NEGATIVE Final    Comment: (NOTE) If result is NEGATIVE SARS-CoV-2 target nucleic acids are NOT DETECTED. The SARS-CoV-2 RNA is generally detectable in upper and lower  respiratory specimens during the acute phase of infection. The lowest  concentration of SARS-CoV-2 viral copies this assay can detect is 250  copies / mL. A negative result does not preclude SARS-CoV-2 infection  and should not be used as the sole basis for treatment or other  patient management decisions.  A negative result may occur with  improper specimen collection / handling, submission of specimen other  than nasopharyngeal swab, presence of viral mutation(s) within the  areas targeted by this assay, and inadequate number of viral copies  (<250 copies / mL). A negative result must be combined with clinical  observations, patient history, and epidemiological information. If result is POSITIVE SARS-CoV-2 target nucleic acids are DETECTED. The SARS-CoV-2 RNA is generally detectable in upper and lower  respiratory specimens dur ing the acute phase of infection.  Positive  results are indicative of active infection with SARS-CoV-2.  Clinical  correlation with patient history and other diagnostic information is  necessary to determine patient infection status.  Positive results do  not rule out bacterial infection or co-infection with other viruses. If result is PRESUMPTIVE POSTIVE SARS-CoV-2 nucleic acids MAY BE PRESENT.   A presumptive positive result was obtained on the submitted specimen  and confirmed on repeat testing.  While 2019 novel coronavirus  (SARS-CoV-2) nucleic acids may be present in the  submitted sample  additional confirmatory testing may be necessary for epidemiological  and / or clinical management purposes  to differentiate between  SARS-CoV-2 and other Sarbecovirus currently known to infect humans.  If clinically indicated  additional testing with an alternate test  methodology [phone]) is advised. The SARS-CoV-2 RNA is generally  detectable in upper and lower respiratory sp ecimens during the acute  phase of infection. The expected result is Negative. Fact Sheet for Patients:  StrictlyIdeas.no Fact Sheet for Healthcare Providers: BankingDealers.co.za This test is not yet approved or cleared by the [place] FDA and has been authorized for detection and/or diagnosis of SARS-CoV-2 by FDA under an Emergency Use Authorization (EUA).  This EUA will remain in effect (meaning this test can be used) for the duration of the COVID-19 declaration under Section 564(b)(1) of the Act, 21 U.S.C. section 360bbb-3(b)(1), unless the authorization is terminated or revoked sooner. Performed at [facility], [place] [address], [place], [place] [address]      Radiological Exams on Admission: Dg Chest Port 1 View  Result Date: 06/01/2019 CLINICAL DATA:  Shortness of breath EXAM: PORTABLE CHEST 1 VIEW COMPARISON:  04/27/2019 FINDINGS: Remote median sternotomy and CABG. There is moderate bilateral interstitial pulmonary edema. No pleural effusion. Moderate cardiomegaly. IMPRESSION: Moderate cardiomegaly and interstitial pulmonary edema. Electronically Signed   By: Ulyses Jarred M.D.   On: 06/01/2019 23:48    EKG: Independently reviewed. Sinus or ectopic atrial tachycardia, rate 133, LBBB. Rate and QRS have increased from prior.   Assessment/Plan   1. Acute on chronic combined CHF; acute hypoxic and hypercarbic respiratory failure  - Patient presents with rapidly worsening SOB, found to be in acute distress with initial BP 200/100  - [name] [name] is afebrile, there is no leukocytosis, COVID-19 negative, BNP is elevated, there is marked peripheral edema, rales, and CXR-findings concerning for acute CHF  - [name] [name] was given Lasix 80 mg IV and was started on nitroglycerin infusion and BiPAP  in ED  - BP, work of breathing, and mental status have all improved significantly in ED but [name] [name] remains dyspneic at rest  - Continue diuresis with Lasix 80 mg IV q12h, continue BP-control, follow daily wt and strict I/O's, monitor renal function and electrolytes during diuresis    2. COPD   - No wheezing or cough on admission  - Continue ICS/LABA and albuterol    3. Elevated troponin; CAD  - Troponin is 0.05 on admission, EKG with LBBB  - Patient denies chest pain in ED  - [name] [name] had NSTEMI in late April, troponin peaked at 12.18 on 04/28/19, [name] [name] was seen by cardiology but not a candidate for cath d/t ongoing cocaine abuse  - Continue cardiac monitoring, trend troponin and EKG, continue medical management with aspirin and statin    4. Insulin-dependent DM  - A1c was 6.6% in April 2020  - Continue Lantus with sliding-scale Novolog    5. Hypertension with hypertensive crisis  - Presents with acute respiratory distress, found to have BP 200/100 with acute CHF  - BP has come down with NTG infusion and sxs have improved significantly  - Continue nitroglycerin for now, continue Norvasc   6. CKD stage IV  - SCr is 3.42 on admission, similar to priors  - Renally-dose medications, follow closely during diuresis    PPE: Mask, face shield  DVT prophylaxis: sq heparin  Code Status: Full  Family Communication: Discussed with patient  Consults called: None Admission status: Observation  Vianne Bulls, MD [facility] Pager [phone]  If 7PM-7AM, please contact night-coverage [email or web address] Password [email or web address]  06/02/2019, 4:01 AM

## 2019-06-02 NOTE — TOC Initial Note (Signed)
Transition of Care [facility]) - Initial/Assessment Note    Patient Details  Name: [name] [name] MRN: [id number] Date of Birth: 1951/11/18  [facility] CM/SW Contact:    Maryclare Labrador, RN Phone Number: 06/02/2019, 1:13 PM  Clinical Narrative:     CM educated pt on importance of daily weights and low sodium consumption.  Pt has PCP and denied barriers with paying for medications              Expected Discharge Plan: Home/Self Care(Independent from home with husband)     Patient Goals and CMS Choice        Expected Discharge Plan and Services Expected Discharge Plan: Home/Self Care(Independent from home with husband)       Living arrangements for the past 2 months: Single Family Home                                      Prior Living Arrangements/Services Living arrangements for the past 2 months: Single Family Home Lives with:: Spouse Patient language and need for interpreter reviewed:: Yes        Need for Family Participation in Patient Care: Yes (Comment) Care giver support system in place?: Yes (comment) Current home services: DME(walker) Criminal Activity/Legal Involvement Pertinent to Current Situation/Hospitalization: No - Comment as needed  Activities of Daily Living      Permission Sought/Granted                  Emotional Assessment   Attitude/Demeanor/Rapport: Self-Confident, Gracious Affect (typically observed): Accepting, Adaptable Orientation: : Oriented to Self, Oriented to Place, Oriented to  Time, Oriented to Situation   Psych Involvement: No (comment)  Admission diagnosis:  Acute pulmonary edema (HCC) [J81.0] AKI (acute kidney injury) ([place]) [N17.9] Acute respiratory failure with hypoxia and hypercapnia (HCC) [J96.01, J96.02] Patient Active Problem List   Diagnosis Date Noted  . Acute respiratory failure with hypoxia and hypercapnia ([place]) 06/02/2019  . Hypertensive crisis 06/02/2019  . Acute on chronic combined  systolic and diastolic CHF (congestive heart failure) ([place]) 06/02/2019  . Acute on chronic respiratory failure with hypoxia ([place]) 04/27/2019  . Elevated troponin 04/27/2019  . Lobar pneumonia ([place]) 04/27/2019  . Sepsis ([place]) 04/27/2019  . Hyperkalemia 04/27/2019  . Fever   . Symptomatic anemia 09/16/2016  . Diabetes mellitus with complication ([place]) 76/73/4193  . Obesity 04/29/2016  . Hiatal hernia   . Heme + stool   . Absolute anemia   . Bleeding gastrointestinal   . Insulin dependent diabetes mellitus ([place])   . Pain in the chest   . Demand ischemia ([place])   . Iron deficiency anemia   . Precordial pain   . Chest pain 04/17/2016  . Thrombocytosis ([place]) 04/17/2016  . Microcytic anemia 02/05/2016  . Abscess   . Acute systolic heart failure ([place])   . Acute on chronic renal failure ([place])   . CAD in native artery   . Substance abuse ([place])   . Chronic combined systolic and diastolic CHF (congestive heart failure) ([place])   . CKD (chronic kidney disease) stage 4, GFR 15-29 ml/min (HCC)   . Demand ischemia of myocardium - related to Acue HF exacerbation 05/14/2015  . Obesity, Class III, BMI 40-49.9 (morbid obesity) ([place])   . Acute on chronic combined systolic and diastolic congestive heart failure ([place]) 05/12/2015  . Acute respiratory failure ([place]) 10/18/2014  . Type 2 diabetes mellitus  with renal manifestations, controlled ([place]) 10/18/2014  . S/P CABG (coronary artery bypass graft) 09/21/2013  . Arthritis of right shoulder region 03/23/2013  . Tobacco abuse   . Coronary artery disease   . NSTEMI (non-ST elevated myocardial infarction) ([place]) 06/29/2012  . History of cocaine abuse ([place]) 06/29/2012  . AKI (acute kidney injury) ([place]) 06/29/2012  . Essential hypertension 06/29/2012  . RBC microcytosis 06/29/2012  . Glaucoma   . Hyperlipidemia   . History of CVA (cerebrovascular accident)    PCP:  [facility] Pharmacy:   [facility] [address] [place] [address] Phone: [phone] Fax: [place], [place] - [address] [place] [address] Phone: [phone] Fax: [phone]     Social Determinants of Health (SDOH) Interventions    Readmission Risk Interventions Readmission Risk Prevention Plan 06/02/2019 04/29/2019  Transportation Screening Complete Complete  Medication Review Press photographer) Complete Complete  PCP or Specialist appointment within 3-5 days of discharge - Complete  HRI or [place] - (No Data)  SW Recovery Care/Counseling Consult Complete Complete  Palliative Care Screening Not Applicable Not [place] Not Applicable Not Applicable  Some recent data might be hidden

## 2019-06-02 NOTE — Plan of Care (Signed)

## 2019-06-02 NOTE — Progress Notes (Signed)
Placed pt. On bipap per MD. 

## 2019-06-02 NOTE — Progress Notes (Signed)
RT note: patient taken off of bipap and placed on 2L nasal cannula.  Currently tolerating well with sats of 97%.  Will continue to monitor.

## 2019-06-02 NOTE — Consult Note (Addendum)
Cardiology Consultation:   Patient ID: [name] [name] MRN: [id number]; DOB: April 12, 1951  Admit date: 06/01/2019 Date of Consult: 06/02/2019  Primary Care Provider: [facility] Primary Cardiologist: Peter Martinique, MD  Primary Electrophysiologist:  None    Patient Profile:   [name] [name] is a 68 y.o. female with a hx of CAD, status post CABG in 4097, chronic systolic heart failure, substance abuse, diabetes, CKD, obesity and history of CVA who is being seen today for the evaluation of chest pain and CHF in the setting of recent cocaine use at the request of Dr. Algis Liming, Internal Medicine.  History of Present Illness:   [name] [name] is a 68 year old female, followed by Dr. Martinique, with a history of CAD and chronic systolic heart failure status post CABG x3 in July 3532 complicated by sternal wound infection requiring debridement in August 2013.  She had repeat cardiac catheterization again in May 2016 that showed patent grafts.  Her EF then was 40 to 45%.  Other medical problems include substance abuse including ongoing cocaine use, diabetes, chronic renal insufficiency, morbid obesity, tobacco use and history of CVA in 2008.  She was recently admitted a few weeks ago, on April 29 through May 2 for shortness of breath.  This was in the setting of cocaine use.  Cardiology was consulted for elevated troponin. Her troponin level that admission peaked at 12.18.  Given her chronic kidney disease with creatinine that hospitalization rising to 3.49 as well as recent cocaine use, cardiac catheterization was not pursued.  Patient was treated medically.  She was treated with IV heparin.  x 48 hours.  She was also treated with aspirin 81 and high intensity statin.  Decision was made not to use Plavix given a prior history of AVM.  Also no treatment with beta-blocker due to ongoing cocaine use and no use of ACE inhibitor because of chronic kidney disease, stage IV.    Unfortunately,  patient has been readmitted for the same issue.  She presented back to the emergency department on June 3 for acute onset of dyspnea that started 1 hour after she smoked cocaine with her friends.  In the ED she was found to be tachypneic, tachycardic, hypoxic and hyperpertensive with a blood pressure of 200/100. Patient was also found to have an elevated troponin and transient Mobitz type II second-degree AV block.  Her troponins are much lower than her previous admission a month ago.  Troponin trend: 0.05>>0.18>>0.19. EKG shows normal sinus rhythm.  76 bpm.  Question lateral infarct Q waves in V5 and V6.  BNP is elevated at 331.8 (619.8 during previous admission 1 month ago). Chest x-ray showed moderate cardiomegaly and interstitial pulmonary edema.  Creatinine on admission was 3.42.  Slightly better today at 3.34.  BUN 41.  Potassium within normal limits at 4.7.  CBC shows anemia.  Hemoglobin 9.4.  She was placed on BiPAP and admitted by internal medicine for acute on chronic combined CHF. She was started on IV Lasix, 80 mg twice daily as well as IV nitro.  Unfortunately, I's and O's not documented.  Her blood pressure however is much improved today at 145/83. IV nitro has been weaned off.  She is on amlodipine for hypertension. Cardiology consulted to assist with further management.   She is feeling better today. Off of Bipap. On [place] 2L/min. Still w/ 1+ bilateral LEE. No chest pain.   Past Medical History:  Diagnosis Date   Anemia 08/2016   Angiodysplasia  of colon    Arthritis of left shoulder region 03/23/2013   Chronic combined systolic and diastolic CHF (congestive heart failure) (HCC)    a. EF 40-45%, mild LVH, mid apicalanteroseptal and apical HK.   CKD (chronic kidney disease), stage III ([facility])    Cocaine abuse ([place])    crack cocaine heavily until 2008 then sporadic use since then   Coronary artery disease    a. 06/2012 NSTEMI/CABG x 3 (LIMA->LAD, VG->OM2, VG->LCX);  b. 04/2015 MV: EF<30%,  mid ant, apicalanterior, apical infarct;  c. 04/2015 Cath: LM nl, LAD 90p, LCX 7m, OM1 min irregs, RCA mild dzs, LIMA->LAD nl w/ dist LAD dzs, VG->OM2 nl, VG->LCX nl-->Med Rx.   CVA (cerebral infarction)    a. right internal capsule stroke in 12/2006   Diabetes mellitus    diagnosed in 2008   Essential hypertension    Glaucoma    Gout    HFrEF (heart failure with reduced ejection fraction) (HCC)    Hyperlipidemia    Hyperparathyroidism, secondary renal (HCC)    Left-sided sensory deficit present    Obesity, morbid ([place])    PVD (peripheral vascular disease) ([place])    a. 06/2012 ABI's: R - 0.73, L - 0.71.   Renal mass, right    Shortness of breath dyspnea    Stroke [facility])    Thyroid nodule    FNA in 0076 showed follicular cells but not definate neoplasm   Tobacco abuse    Trichomoniasis     Past Surgical History:  Procedure Laterality Date   CARDIAC CATHETERIZATION     CARDIAC CATHETERIZATION N/A 05/17/2015   Procedure: Left Heart Cath and Cors/Grafts Angiography;  Surgeon: [name], MD;  Location: [place] CV LAB;  Service: Cardiovascular;  Laterality: N/A;   COLONOSCOPY WITH PROPOFOL N/A 04/21/2016   Procedure: COLONOSCOPY WITH PROPOFOL;  Surgeon: Irene Shipper, MD;  Location: [place];  Service: Endoscopy;  Laterality: N/A;   CORONARY ARTERY BYPASS GRAFT  07/09/2012   Procedure: CORONARY ARTERY BYPASS GRAFTING (CABG);  Surgeon: Ivin Poot, MD;  Location: [place];  Service: Open Heart Surgery;  Laterality: N/A;   ESOPHAGOGASTRODUODENOSCOPY N/A 04/20/2016   Procedure: ESOPHAGOGASTRODUODENOSCOPY (EGD);  Surgeon: Gatha Mayer, MD;  Location: [facility] ENDOSCOPY;  Service: Endoscopy;  Laterality: N/A;   LEFT HEART CATHETERIZATION WITH CORONARY ANGIOGRAM N/A 06/29/2012   Procedure: LEFT HEART CATHETERIZATION WITH CORONARY ANGIOGRAM;  Surgeon: Peter M Martinique, MD;  Location[facility] CATH LAB;  Service: Cardiovascular;  Laterality: N/A;   STERNAL WOUND DEBRIDEMENT   08/17/2012   Procedure: STERNAL WOUND DEBRIDEMENT;  Surgeon: Ivin Poot, MD;  Location: [place];  Service: Thoracic;  Laterality: N/A;  wound vac application   STERNAL WOUND DEBRIDEMENT  08/24/2012   Procedure: STERNAL WOUND DEBRIDEMENT;  Surgeon: Ivin Poot, MD;  Location: [place];  Service: Thoracic;  Laterality: N/A;   STERNAL WOUND DEBRIDEMENT  09/01/2012   Procedure: STERNAL WOUND DEBRIDEMENT;  Surgeon: Ivin Poot, MD;  Location: [place];  Service: Thoracic;  Laterality: N/A;   STERNAL WOUND DEBRIDEMENT  09/20/2012   Procedure: STERNAL WOUND DEBRIDEMENT;  Surgeon: Ivin Poot, MD;  Location: [facility] OR;  Service: Thoracic;  Laterality: N/A;  wound vac change     Home Medications:  Prior to Admission medications   Medication Sig Start Date End Date Taking? Authorizing Provider  allopurinol (ZYLOPRIM) 100 MG tablet Take 100 mg by mouth daily. 11/25/17  Yes [provider]  amLODipine (NORVASC) 5 MG tablet Take 1  tablet (5 mg total) by mouth daily. 04/30/19 04/29/20 Yes [name], MD  aspirin EC 325 MG tablet Take 325 mg by mouth daily. 05/13/19  Yes [provider]  brinzolamide (AZOPT) 1 % ophthalmic suspension Place 1 drop into both eyes 3 (three) times daily.   Yes [provider]  buPROPion (WELLBUTRIN SR) 150 MG 12 hr tablet Take 1 tablet (150 mg total) by mouth 2 (two) times daily. 01/16/16  Yes Mikhail, Velta Addison, DO  cetirizine (ZYRTEC) 10 MG tablet Take 10 mg by mouth daily as needed for allergies.    Yes [provider]  ferrous sulfate 325 (65 FE) MG tablet Take 325 mg by mouth 3 (three) times daily with meals.   Yes [provider]  furosemide (LASIX) 40 MG tablet Take 1 tablet (40 mg total) by mouth daily. Take additional dose if you experience weight gain. Patient taking differently: Take 40-80 mg by mouth See admin instructions. Take 2 tablets in the morning and 1 tablet at bedtime 05/17/15  Yes Mikhail, [place], DO  gabapentin  (NEURONTIN) 300 MG capsule Take 300 mg by mouth 3 (three) times daily.   Yes [provider]  glimepiride (AMARYL) 4 MG tablet Take 4 mg by mouth daily. 05/04/19  Yes [provider]  Insulin Glargine (LANTUS SOLOSTAR) 100 UNIT/ML Solostar Pen Inject 25 Units into the skin daily at 10 pm. Patient taking differently: Inject 21 Units into the skin daily at 10 pm.  01/16/16  Yes Mikhail, Velta Addison, DO  latanoprost (XALATAN) 0.005 % ophthalmic solution Place 1 drop into both eyes at bedtime.   Yes [provider]  LINZESS 72 MCG capsule Take 72 mcg by mouth daily before breakfast.  11/21/17  Yes [provider]  metolazone (ZAROXOLYN) 5 MG tablet Take 5 mg by mouth daily. 11/06/17  Yes [provider]  metoprolol tartrate (LOPRESSOR) 25 MG tablet Take 25 mg by mouth 2 (two) times a day. 05/04/19  Yes [provider]  nitroGLYCERIN (NITROSTAT) 0.4 MG SL tablet Place 1 tablet (0.4 mg total) under the tongue every 5 (five) minutes as needed for chest pain. 04/30/19  Yes [name], MD  pantoprazole (PROTONIX) 40 MG tablet Take 1 tablet (40 mg total) by mouth daily. 09/23/16  Yes Thurnell Lose, MD  polyethylene glycol (MIRALAX / GLYCOLAX) 17 g packet Take 17 g by mouth daily as needed for mild constipation.    Yes [provider]  pravastatin (PRAVACHOL) 40 MG tablet Take 40 mg by mouth daily. 05/14/19  Yes [provider]  PROAIR HFA 108 (90 Base) MCG/ACT inhaler Inhale 1-2 puffs into the lungs every 6 (six) hours as needed for wheezing.  11/17/17  Yes [provider]  Grant Ruts INHUB 250-50 MCG/DOSE AEPB Inhale 1 puff into the lungs 2 (two) times a day. 05/04/19  Yes [provider]    Inpatient Medications: Scheduled Meds:  [START ON 06/03/2019] allopurinol  100 mg Oral Daily   amLODipine  5 mg Oral Daily   [START ON 06/03/2019] aspirin EC  325 mg Oral Daily   brinzolamide  1 drop Both Eyes TID   buPROPion  150 mg Oral BID     chlorhexidine  15 mL Mouth Rinse BID   ferrous sulfate  325 mg Oral TID WC   furosemide  80 mg Intravenous BID   gabapentin  300 mg Oral TID   heparin  5,000 Units Subcutaneous Q8H   insulin aspart  0-5 Units Subcutaneous QHS  insulin aspart  0-9 Units Subcutaneous TID WC   insulin glargine  18 Units Subcutaneous QHS   latanoprost  1 drop Both Eyes QHS   [START ON 06/03/2019] linaclotide  72 mcg Oral QAC breakfast   mouth rinse  15 mL Mouth Rinse q12n4p   mometasone-formoterol  2 puff Inhalation BID   pantoprazole  40 mg Oral Daily   pravastatin  40 mg Oral Daily   sodium chloride flush  3 mL Intravenous Q12H   Continuous Infusions:  sodium chloride     PRN Meds: sodium chloride, acetaminophen, albuterol, nitroGLYCERIN, ondansetron (ZOFRAN) IV, polyethylene glycol, sodium chloride flush  Allergies:    Allergies  Allergen Reactions   Naproxen Rash    Social History:   Social History   Socioeconomic History   Marital status: Single    Spouse name: Not on file   Number of children: 0   Years of education: Not on file   Highest education level: Not on file  Occupational History   Not on file  Social Needs   Financial resource strain: Not on file   Food insecurity:    Worry: Not on file    Inability: Not on file   Transportation needs:    Medical: Not on file    Non-medical: Not on file  Tobacco Use   Smoking status: Current Some Day Smoker    Packs/day: 0.25    Years: 50.00    Pack years: 12.50    Types: Cigarettes   Smokeless tobacco: Never Used  Substance and Sexual Activity   Alcohol use: No    Alcohol/week: 0.0 standard drinks   Drug use: Not Currently    Types: Cocaine    Comment: pt denies at current time   Sexual activity: Never  Lifestyle   Physical activity:    Days per week: Not on file    Minutes per session: Not on file   Stress: Not on file  Relationships   Social connections:    Talks on phone: Not on  file    Gets together: Not on file    Attends religious service: Not on file    Active member of club or organization: Not on file    Attends meetings of clubs or organizations: Not on file    Relationship status: Not on file   Intimate partner violence:    Fear of current or ex partner: Not on file    Emotionally abused: Not on file    Physically abused: Not on file    Forced sexual activity: Not on file  Other Topics Concern   Not on file  Social History Narrative   Lives with boyfriend of 20 years.     Family History:    Family History  Problem Relation Age of Onset   Diabetes Mother    Hypertension Mother    Cancer Mother    Hyperlipidemia Father    Hypertension Father    Kidney disease Father    Gout Father    Cerebrovascular Accident Father    Other Other        no known family CAD     ROS:  Please see the history of present illness.   All other ROS reviewed and negative.     Physical Exam/Data:   Vitals:   06/02/19 0935 06/02/19 1000 06/02/19 1102 06/02/19 1132  BP:  (!) 150/68 (!) 145/83   Pulse:  79 80 91  Resp:  15 16 20   Temp:  97.7 F (36.5 C)  TempSrc:    Oral  SpO2: 97% 98% 100% 96%  Weight:        Intake/Output Summary (Last 24 hours) at 06/02/2019 1459 Last data filed at 06/02/2019 1330 Gross per 24 hour  Intake 720 ml  Output 425 ml  Net 295 ml   Last 3 Weights 06/02/2019 05/27/2019 04/30/2019  Weight (lbs) 276 lb 3.8 oz 283 lb 295 lb 12.8 oz  Weight (kg) 125.3 kg 128.368 kg 134.174 kg     Body mass index is 41.39 kg/m.  General:  obese middle aged AAF in no acute distress HEENT: normal Lymph: no adenopathy Neck: no JVD Endocrine:  No thryomegaly Vascular: No carotid bruits; FA pulses 2+ bilaterally without bruits  Cardiac:  normal S1, S2; RRR; no murmur  Lungs:  clear to auscultation bilaterally, no wheezing, rhonchi or rales  Abd: soft, nontender, no hepatomegaly  Ext:  1 + bilateral LEE  Musculoskeletal:  No  deformities, BUE and BLE strength normal and equal Skin: warm and dry  Neuro:  CNs 2-12 intact, no focal abnormalities noted Psych:  Normal affect   EKG:  The EKG was personally reviewed and demonstrates: Normal sinus rhythm.  76 bpm.?  Lateral infarct, Q waves V6 Telemetry:  Telemetry was personally reviewed and demonstrates:  NSR currently, transient 2nd degree AV block type II earlier   Relevant CV Studies: 2D echocardiogram 04/28/2019 IMPRESSIONS    1. The left ventricle has mildly reduced systolic function, with an ejection fraction of 45-50%. The cavity size was normal. There is severely increased left ventricular wall thickness. Left ventricular diastolic Doppler parameters are consistent with  pseudonormalization.  2. The right ventricle has normal systolic function. The cavity was normal. There is no increase in right ventricular wall thickness.  3. Left atrial size was mildly dilated.  4. Mild thickening of the mitral valve leaflet.  5. No stenosis of the aortic valve.  6. The interatrial septum was not assessed.   Laboratory Data:  Chemistry Recent Labs  Lab 06/01/19 2322 06/02/19 0135 06/02/19 0932  NA 144 142 142  K 4.8 5.0 4.7  CL 112*  --  111  CO2 15*  --  20*  GLUCOSE 380*  --  257*  BUN 37*  --  41*  CREATININE 3.42*  --  3.34*  CALCIUM 9.5  --  9.3  GFRNONAA 13*  --  13*  GFRAA 15*  --  16*  ANIONGAP 17*  --  11    No results for input(s): PROT, ALBUMIN, AST, ALT, ALKPHOS, BILITOT in the last 168 hours. Hematology Recent Labs  Lab 06/01/19 2322 06/02/19 0135 06/02/19 0557  WBC 9.9  --  16.1*  RBC 4.30  --  3.84*  HGB 10.3* 10.5* 9.4*  HCT 36.4 31.0* 31.5*  MCV 84.7  --  82.0  MCH 24.0*  --  24.5*  MCHC 28.3*  --  29.8*  RDW 18.2*  --  18.1*  PLT 467*  --  340   Cardiac Enzymes Recent Labs  Lab 06/01/19 2322 06/02/19 0557 06/02/19 0932  TROPONINI 0.05* 0.18* 0.19*   No results for input(s): TROPIPOC in the last 168 hours.   BNP Recent Labs  Lab 06/01/19 2322  BNP 331.8*    DDimer No results for input(s): DDIMER in the last 168 hours.  Radiology/Studies:  Dg Chest Port 1 View  Result Date: 06/01/2019 CLINICAL DATA:  Shortness of breath EXAM: PORTABLE CHEST 1 VIEW COMPARISON:  04/27/2019  FINDINGS: Remote median sternotomy and CABG. There is moderate bilateral interstitial pulmonary edema. No pleural effusion. Moderate cardiomegaly. IMPRESSION: Moderate cardiomegaly and interstitial pulmonary edema. Electronically Signed   By: Ulyses Jarred M.D.   On: 06/01/2019 23:48    Assessment and Plan:   SHILO PHILIPSON is a 68 y.o. female with a hx of CAD, status post CABG in 9326, chronic systolic heart failure, substance abuse, diabetes, CKD, obesity and history of CVA who is being seen today for the evaluation of chest pain and CHF in the setting of recent cocaine use at the request of Dr. Algis Liming, Internal Medicine.  1.  Cocaine Use: Ongoing issue.  Second hospitalization for chest pain/CHF w/ cocaine use in the last 4 weeks.  Needs to avoid further use.  Continue to avoid beta-blockers.  2. CAD: Status post CABG in 2013.  Her last cardiac catheterization in 2016 showed patent grafts. She denies any recent prolonged CP. Just twinges of CP every now and then. Sharp and last only a couple of sec. Given pain characteristics and low troponin level, doubt plaque rupture. However she is at risk for coronary spasms given cocaine use. Patient needs to avoid further use of cocaine.  Continue to treat with Aspirin and statin.  No Plavix given history of AVMs.  No beta-blocker due to ongoing cocaine use.  3. Acute on chronic systolic heart failure: Most recent echocardiogram April 2020 showed mildly reduced EF at 45 to 50%, consistent with prior studies.  Current acute CHF exacerbation is in the setting of recent cocaine use and severely elevated BP with initial pressure of 200/100.  BNP elevated at 331.  Chest x-ray showed moderate  cardiomegaly and interstitial pulmonary edema.  She was treated with IV Lasix and IV nitro.  Blood pressure improved.  Feeling better with diuresis but still w/ LEE.  Unfortunately I's and O's not recorded.  She is now off of BiPAP. Continue Lasix. No ACE or ARB due to CKD.  No beta-blocker due to ongoing cocaine use.  4.  Hypertensive emergency: Severely elevated BP in the setting of cocaine use.  Troponin mildly elevated with flat trend consistent with demand ischemia.  Blood pressure improved with IV nitro.  This has been discontinued.  She is currently on amlodipine, 5 mg daily.  Last documented BP was 145/83.  Can further titrate amlodipine as needed.  5.  Elevated troponin: 0.05>>0.18>>0.19.  Suspect demand ischemia in the setting of hypertensive emergency, acute CHF and cocaine use.  No plans for ischemic evaluation.  6. CKD: Serum creatinine 3.34.  BUN 41.  Her creatinine since 2017 has ranged from 2.5-3.4.  Since yesterday creatinine has improved some with diuresis. Continue to monitor closely while diuresing.   7. Mobitz Type II, Second Degree AV Block: transient. Noted on tele earlier today but pt asymptomatic. Currently NSR. K WNL. ? May be secondary to cocaine use. Not on any AV nodal blocking agents. Continue to observe   8. T2DM: on insulin. Management per IM.   9. Infectious Disease: COVID Negative.   10. HLD: last lipid panel 03/2019 showed elevated LDL at 108. Given CAD, LDL goal < 70 mg/dL. She is currently on Pravastatin 40 mg. Only listed allergy in chart is naproxen. Recommend trial of atorvastatin given CAD and DM. Repeat FLP and HFTs in 6-8 weeks in our clinic.   For questions or updates, please contact [place] Please consult [email or web address] for contact info under     Signed, Lyda Jester, PA-C  06/02/2019 2:59 PM   Attending Note:   The patient was seen and examined.  Agree with assessment and plan as noted above.  Changes made to the above note as  needed.  Patient seen and independently examined with Brittainy Simmons,PA .   We discussed all aspects of the encounter. I agree with the assessment and plan as stated above.  1.   Cocaine abuse:   This is her primary issue currently .   She has had several admissions for CHF exacerbation, cp, troponin elevation - all associated with cocaine use.   I've discuss this with her.    2.  Chest pain :  Has twinges of CP.   Troponin is 0.19 - likely due to demand ischemia from her marked HTN and CHF  3.   Acute on chronic systolic chf;:   Exacerbated by her cocaine use . Better after diuresis, BIPAP, and control of her HTN.   4.  CKD:  Creatinine is 3.34.   Plans per primary .     I have spent a total of 40 minutes with patient reviewing hospital  notes , telemetry, EKGs, labs and examining patient as well as establishing an assessment and plan that was discussed with the patient. > 50% of time was spent in direct patient care.    Thayer Headings, Brooke Bonito., MD, [facility] 06/02/2019, 3:21 PM [address],  [place] Pager [phone]

## 2019-06-02 NOTE — Progress Notes (Signed)
Pt assessed for need for BIPAP V60 and pt not in need of at this time. Pt states she does not want at this time either. Pt respiratory status stable at this time. RT will continue to monitor.

## 2019-06-02 NOTE — Progress Notes (Signed)
PROGRESS NOTE   [name] [name]  WHQ:[id number]    DOB: 12-29-1951    DOA: 06/01/2019  PCP: [facility] have briefly reviewed patients previous medical records in [facility].  Brief Narrative:  68 year old female with PMH of ongoing cocaine abuse, CAD status post CABG, chronic combined systolic and diastolic CHF, type II DM/IDDM, HTN, HLD, stroke, GERD, gout, iron deficiency anemia, tobacco abuse, depression, COPD not on home oxygen, stage IV chronic kidney disease, recent hospitalization 04/27/2019-04/30/2019 for NSTEMI for which cardiology consulted and recommended medical management due to ongoing cocaine use, no beta-blockers in the setting of cocaine use, presented to [facility] ED on 6/3 due to acute onset of dyspnea that started an hour after she smoked cocaine with her friends.  In the ED, hypoxic, tachypneic, tachycardic and initial BP 200/100, placed on BiPAP, admitted for acute on chronic combined CHF, acute hypoxic and hypercapnic respiratory failure due to cocaine use.  Cardiology consulted 6/4 for elevated troponin and transient Mobitz type II second-degree AV block both of which will likely be managed conservatively.   Assessment & Plan:   Principal Problem:   Acute respiratory failure with hypoxia and hypercapnia (HCC) Active Problems:   History of CVA (cerebrovascular accident)   Essential hypertension   Type 2 diabetes mellitus with renal manifestations, controlled ([place])   Acute on chronic combined systolic and diastolic congestive heart failure (HCC)   CKD (chronic kidney disease) stage 4, GFR 15-29 ml/min (HCC)   Substance abuse (HCC)   Elevated troponin   Hypertensive crisis   Acute on chronic combined systolic and diastolic CHF  Most likely precipitated by cocaine abuse and associated hypertensive urgency.  TTE 04/28/2019: LVEF 45-50% and severely increased left ventricular wall thickness.  Placed on nitroglycerin drip, BiPAP, started on IV  Lasix 80 mg every 12 hourly.  Clinically improved but still volume overloaded, continue IV Lasix.  Intake output not charted yet.  Off of BiPAP this morning.  Wean NTG to off.  Acute hypoxic and hypercarbic respiratory failure  Due to decompensated CHF complicating underlying COPD.  BiPAP overnight of admission, discontinued 6/4.  Monitor on oxygen via nasal cannula and wean as tolerated for saturation >92%.  No clinical bronchospasm.  SARS coronavirus 2 testing negative.  Elevated troponin/CAD status post CABG/recent NSTEMI  Secondary to demand ischemia from cocaine abuse, decompensated CHF and acute respiratory failure on top of chronic kidney disease.  No chest pain.  Troponin with flat trends.  Cardiology consulted but most likely will be for conservative management and no interventions due to ongoing cocaine abuse.  Continue aspirin and statins.  No beta-blockers due to cocaine.  Transient Mobitz type II second-degree AV block  Noted on telemetry 6/4 at 5:49 AM.  Bradycardia at 38.  Continue to monitor on telemetry.  Avoid negative chronotropic.  Cardiology consulted but will most likely be for observation/conservative management.  COPD  No clinical bronchospasm.  Type II DM/IDDM with renal complications  K5L 6.6 in April 2020.  Adjust Lantus and continue SSI.  Hypertensive urgency  Blood pressure 200/100 on initial presentation.  Precipitated by cocaine use.  Placed on NTG infusion from ED, now down to 5 mcg/min, wean off.  Continue amlodipine 5 mg daily and may uptitrate to 10 mg daily if persistent hypertension.  Stage IV chronic kidney disease  Creatinine at recent discharge was 3.29.  Currently 3.34, at baseline.  Follow BMP closely.  Cocaine abuse  Extensively counseled regarding cessation.  Patient verbalized understanding.  GERD  Continue PPI.  Morbid obesity/Body mass index is 41.39 kg/m.  Iron deficiency  anemia  Stable.    DVT prophylaxis: Subcutaneous heparin Code Status: Full Family Communication: None at bedside Disposition: Patient was admitted as observation.  However she still has dyspnea, volume overload related to decompensated CHF, remains on nitroglycerin drip that needs to be weaned off with close monitoring of blood pressure and adjustment of antihypertensives as needed, also noted transient Mobitz type II second-degree heart block which needs close monitoring on telemetry and cardiology input.  She will remain on IV Lasix at least for the next 24 hours.  Thereby changed to inpatient status.   Consultants:  Cardiology  Procedures:  BiPAP  Antimicrobials:  None   Subjective: Patient reports that she smoked cocaine with her friends on night of admission at around 6 PM and an hour later developed acute onset of dyspnea which progressively got worse prompting ED visit.  Never had chest pain.  Has been off of BiPAP for less than an hour.  Reports that her dyspnea has improved but breathing is not yet at baseline.  Still has some leg swelling.  Does not use home oxygen.  Asking for something to eat.  ROS: As above, otherwise negative.  Objective:  Vitals:   06/02/19 0900 06/02/19 0935 06/02/19 1000 06/02/19 1102  BP: (!) 155/80  (!) 150/68 (!) 145/83  Pulse: 76  79 80  Resp: 17  15 16   Temp:      TempSrc:      SpO2: 100% 97% 98% 100%  Weight:        Examination:  General exam: Middle-aged female, moderately built and morbidly obese lying comfortably propped up in bed. Respiratory system: Slightly diminished breath sounds in the bases with few fine bibasilar crackles.  Rest of lung fields clear to auscultation. Respiratory effort normal. Cardiovascular system: S1 & S2 heard, RRR. No JVD, murmurs, rubs, gallops or clicks.  1+ bilateral pitting edema up to knee.  Telemetry personally reviewed: Sinus rhythm.  Transient Mobitz type II second-degree AV block with heart  rate of 38 at 5:49 AM on 6/4. Gastrointestinal system: Abdomen is nondistended, soft and nontender. No organomegaly or masses felt. Normal bowel sounds heard. Central nervous system: Alert and oriented. No focal neurological deficits. Extremities: Symmetric 5 x 5 power. Skin: No rashes, lesions or ulcers Psychiatry: Judgement and insight appear normal. Mood & affect appropriate.     Data Reviewed: I have personally reviewed following labs and imaging studies  CBC: Recent Labs  Lab 06/01/19 2316 06/01/19 2322 06/02/19 0135 06/02/19 0557  WBC  --  9.9  --  16.1*  NEUTROABS  --  5.2  --  15.3*  HGB 11.6* 10.3* 10.5* 9.4*  HCT 34.0* 36.4 31.0* 31.5*  MCV  --  84.7  --  82.0  PLT  --  467*  --  732   Basic Metabolic Panel: Recent Labs  Lab 06/01/19 2316 06/01/19 2322 06/02/19 0135 06/02/19 0932  NA 142 144 142 142  K 4.8 4.8 5.0 4.7  CL  --  112*  --  111  CO2  --  15*  --  20*  GLUCOSE  --  380*  --  257*  BUN  --  37*  --  41*  CREATININE  --  3.42*  --  3.34*  CALCIUM  --  9.5  --  9.3   Cardiac Enzymes: Recent Labs  Lab 06/01/19 2322 06/02/19  0557 06/02/19 0932  TROPONINI 0.05* 0.18* 0.19*   CBG: Recent Labs  Lab 06/02/19 0346 06/02/19 0620  GLUCAP 290* 276*    Recent Results (from the past 240 hour(s))  SARS Coronavirus 2 (CEPHEID - Performed in [place] hospital lab), Hosp Order     Status: None   Collection Time: 06/01/19 11:20 PM  Result Value Ref Range Status   SARS Coronavirus 2 NEGATIVE NEGATIVE Final    Comment: (NOTE) If result is NEGATIVE SARS-CoV-2 target nucleic acids are NOT DETECTED. The SARS-CoV-2 RNA is generally detectable in upper and lower  respiratory specimens during the acute phase of infection. The lowest  concentration of SARS-CoV-2 viral copies this assay can detect is 250  copies / mL. A negative result does not preclude SARS-CoV-2 infection  and should not be used as the sole basis for treatment or other  patient  management decisions.  A negative result may occur with  improper specimen collection / handling, submission of specimen other  than nasopharyngeal swab, presence of viral mutation(s) within the  areas targeted by this assay, and inadequate number of viral copies  (<250 copies / mL). A negative result must be combined with clinical  observations, patient history, and epidemiological information. If result is POSITIVE SARS-CoV-2 target nucleic acids are DETECTED. The SARS-CoV-2 RNA is generally detectable in upper and lower  respiratory specimens dur ing the acute phase of infection.  Positive  results are indicative of active infection with SARS-CoV-2.  Clinical  correlation with patient history and other diagnostic information is  necessary to determine patient infection status.  Positive results do  not rule out bacterial infection or co-infection with other viruses. If result is PRESUMPTIVE POSTIVE SARS-CoV-2 nucleic acids MAY BE PRESENT.   A presumptive positive result was obtained on the submitted specimen  and confirmed on repeat testing.  While 2019 novel coronavirus  (SARS-CoV-2) nucleic acids may be present in the submitted sample  additional confirmatory testing may be necessary for epidemiological  and / or clinical management purposes  to differentiate between  SARS-CoV-2 and other Sarbecovirus currently known to infect humans.  If clinically indicated additional testing with an alternate test  methodology [phone]) is advised. The SARS-CoV-2 RNA is generally  detectable in upper and lower respiratory sp ecimens during the acute  phase of infection. The expected result is Negative. Fact Sheet for Patients:  StrictlyIdeas.no Fact Sheet for Healthcare Providers: BankingDealers.co.za This test is not yet approved or cleared by the [place] FDA and has been authorized for detection and/or diagnosis of SARS-CoV-2 by FDA under  an Emergency Use Authorization (EUA).  This EUA will remain in effect (meaning this test can be used) for the duration of the COVID-19 declaration under Section 564(b)(1) of the Act, 21 U.S.C. section 360bbb-3(b)(1), unless the authorization is terminated or revoked sooner. Performed at [facility], [place] [address], [place], [place] [address]          Radiology Studies: Dg Chest Port 1 View  Result Date: 06/01/2019 CLINICAL DATA:  Shortness of breath EXAM: PORTABLE CHEST 1 VIEW COMPARISON:  04/27/2019 FINDINGS: Remote median sternotomy and CABG. There is moderate bilateral interstitial pulmonary edema. No pleural effusion. Moderate cardiomegaly. IMPRESSION: Moderate cardiomegaly and interstitial pulmonary edema. Electronically Signed   By: Ulyses Jarred M.D.   On: 06/01/2019 23:48        Scheduled Meds:  amLODipine  5 mg Oral Daily   aspirin EC  81 mg Oral Daily   atorvastatin  80 mg Oral q1800   chlorhexidine  15 mL Mouth Rinse BID   furosemide  80 mg Intravenous BID   heparin  5,000 Units Subcutaneous Q8H   insulin aspart  0-5 Units Subcutaneous QHS   insulin aspart  0-9 Units Subcutaneous TID WC   insulin glargine  15 Units Subcutaneous QHS   mouth rinse  15 mL Mouth Rinse q12n4p   mometasone-formoterol  2 puff Inhalation BID   pantoprazole  40 mg Oral Daily   sodium chloride flush  3 mL Intravenous Q12H   Continuous Infusions:  sodium chloride     nitroGLYCERIN 2 mcg/min (06/02/19 5400)     LOS: 0 days     Vernell Leep, MD, FACP, [facility]. [facility]  To contact the attending provider between 7A-7P or the covering provider during after hours 7P-7A, please log into the web site [email or web address] and access using universal  password for that web site. If you do not have the password, please call the hospital operator.  06/02/2019, 11:13 AM

## 2019-06-02 NOTE — Progress Notes (Signed)
CRITICAL VALUE ALERT  Critical Value:  Troponin 0.18  Date & Time Notied:  06/02/2019@0710   Provider Notified: dr Lindaann Pascal  Orders Received/Actions taken: continue to monitor

## 2019-06-02 NOTE — ED Notes (Signed)
ED TO INPATIENT HANDOFF REPORT  ED Nurse Name and Phone #:  [name] [name] [place] 68 y.o. female Room/Bed: 031C/031C  Code Status   Code Status: Prior  Home/SNF/Other : HOME  Triage Complete: Triage complete  Chief Complaint SOB  Triage Note Pt presents to ED by [facility] from home c/o SOB worsening today. Per EMS wheezes in the upper lobes. Clearly dyspneic at rest. Per family pt had a fever a few days ago. Denies pain. Hx asthma, COPD, cigarette smoker.   Allergies Allergies  Allergen Reactions  . Naproxen Rash    Level of Care/Admitting Diagnosis ED Disposition    ED Disposition Condition [facility] Area: [place] [100100]  Level of Care: Progressive [102]  I expect the patient will be discharged within 24 hours: Yes  LOW acuity---Tx typically complete <24 hrs---ACUTE conditions typically can be evaluated <24 hours---LABS likely to return to acceptable levels <24 hours---IS near functional baseline---EXPECTED to return to current living arrangement---NOT newly hypoxic: Does not meet criteria for 5C-Observation unit  Covid Evaluation: Screening Protocol (No Symptoms)  Diagnosis: Acute respiratory failure with hypoxia and hypercapnia [facility]) [[id number]]  Admitting Physician: Vianne Bulls [[id number]]  Attending Physician: Vianne Bulls [[id number]]  PT Class (Do Not Modify): Observation [104]  PT Acc Code (Do Not Modify): Observation [10022]       B Medical/Surgery History Past Medical History:  Diagnosis Date  . Anemia 08/2016  . Angiodysplasia of colon   . Arthritis of left shoulder region 03/23/2013  . Chronic combined systolic and diastolic CHF (congestive heart failure) (HCC)    a. EF 40-45%, mild LVH, mid apicalanteroseptal and apical HK.  . CKD (chronic kidney disease), stage III ([place])   . Cocaine abuse ([place])    crack cocaine heavily until 2008 then sporadic use since then  . Coronary artery  disease    a. 06/2012 NSTEMI/CABG x 3 (LIMA->LAD, VG->OM2, VG->LCX);  b. 04/2015 MV: EF<30%, mid ant, apicalanterior, apical infarct;  c. 04/2015 Cath: LM nl, LAD 90p, LCX 25m, OM1 min irregs, RCA mild dzs, LIMA->LAD nl w/ dist LAD dzs, VG->OM2 nl, VG->LCX nl-->Med Rx.  . CVA (cerebral infarction)    a. right internal capsule stroke in 12/2006  . Diabetes mellitus    diagnosed in 2008  . Essential hypertension   . Glaucoma   . Gout   . HFrEF (heart failure with reduced ejection fraction) ([place])   . Hyperlipidemia   . Hyperparathyroidism, secondary renal ([place])   . Left-sided sensory deficit present   . Obesity, morbid ([place])   . PVD (peripheral vascular disease) ([place])    a. 06/2012 ABI's: R - 0.73, L - 0.71.  Marland Kitchen Renal mass, right   . Shortness of breath dyspnea   . Stroke ([place])   . Thyroid nodule    FNA in 2297 showed follicular cells but not definate neoplasm  . Tobacco abuse   . Trichomoniasis    Past Surgical History:  Procedure Laterality Date  . CARDIAC CATHETERIZATION    . CARDIAC CATHETERIZATION N/A 05/17/2015   Procedure: Left Heart Cath and Cors/Grafts Angiography;  Surgeon: [name], MD;  Location: [facility];  Service: Cardiovascular;  Laterality: N/A;  . COLONOSCOPY WITH PROPOFOL N/A 04/21/2016   Procedure: COLONOSCOPY WITH PROPOFOL;  Surgeon: Irene Shipper, MD;  Location: [facility];  Service: Endoscopy;  Laterality: N/A;  . CORONARY ARTERY BYPASS GRAFT  07/09/2012   Procedure:  CORONARY ARTERY BYPASS GRAFTING (CABG);  Surgeon: Ivin Poot, MD;  Location: [place];  Service: Open Heart Surgery;  Laterality: N/A;  . ESOPHAGOGASTRODUODENOSCOPY N/A 04/20/2016   Procedure: ESOPHAGOGASTRODUODENOSCOPY (EGD);  Surgeon: Gatha Mayer, MD;  Location: [facility] ENDOSCOPY;  Service: Endoscopy;  Laterality: N/A;  . LEFT HEART CATHETERIZATION WITH CORONARY ANGIOGRAM N/A 06/29/2012   Procedure: LEFT HEART CATHETERIZATION WITH CORONARY ANGIOGRAM;  Surgeon: Peter M Martinique, MD;  Location:  [facility] CATH LAB;  Service: Cardiovascular;  Laterality: N/A;  . STERNAL WOUND DEBRIDEMENT  08/17/2012   Procedure: STERNAL WOUND DEBRIDEMENT;  Surgeon: Ivin Poot, MD;  Location: [facility] OR;  Service: Thoracic;  Laterality: N/A;  wound vac application  . STERNAL WOUND DEBRIDEMENT  08/24/2012   Procedure: STERNAL WOUND DEBRIDEMENT;  Surgeon: Ivin Poot, MD;  Location: [place];  Service: Thoracic;  Laterality: N/A;  . STERNAL WOUND DEBRIDEMENT  09/01/2012   Procedure: STERNAL WOUND DEBRIDEMENT;  Surgeon: Ivin Poot, MD;  Location: [place];  Service: Thoracic;  Laterality: N/A;  . STERNAL WOUND DEBRIDEMENT  09/20/2012   Procedure: STERNAL WOUND DEBRIDEMENT;  Surgeon: Ivin Poot, MD;  Location: [place];  Service: Thoracic;  Laterality: N/A;  wound vac change     A IV Location/Drains/Wounds Patient Lines/Drains/Airways Status   Active Line/Drains/Airways    Name:   Placement date:   Placement time:   Site:   Days:   Peripheral IV 06/01/19 Right Hand   06/01/19    0231    Hand   1   Peripheral IV 06/01/19 Right Antecubital   06/01/19    2337    Antecubital   1   External Urinary Catheter   06/01/19    2331    -   1   Wound / Incision (Open or Dehisced) 01/27/15 Other (Comment) Hand Right Pt has an open incision to RT hand at site of abcess.    01/27/15    1627    Hand   1587   Wound / Incision (Open or Dehisced) 01/15/16 Other (Comment) Other (Comment) Left   01/15/16    0807    Other (Comment)   1234          Intake/Output Last 24 hours No intake or output data in the 24 hours ending 06/02/19 0241  Labs/Imaging Results for orders placed or performed during the hospital encounter of 06/01/19 (from the past 48 hour(s))  I-STAT 7, (LYTES, BLD GAS, ICA, H+H)     Status: Abnormal   Collection Time: 06/01/19 11:16 PM  Result Value Ref Range   pH, Arterial 7.062 (LL) 7.350 - 7.450   pCO2 arterial 66.0 (HH) 32.0 - 48.0 mmHg   pO2, Arterial 82.0 (L) 83.0 - 108.0 mmHg   Bicarbonate 18.8 (L)  20.0 - 28.0 mmol/L   TCO2 21 (L) 22 - 32 mmol/L   O2 Saturation 90.0 %   Acid-base deficit 12.0 (H) 0.0 - 2.0 mmol/L   Sodium 142 135 - 145 mmol/L   Potassium 4.8 3.5 - 5.1 mmol/L   Calcium, Ion 1.31 1.15 - 1.40 mmol/L   HCT 34.0 (L) 36.0 - 46.0 %   Hemoglobin 11.6 (L) 12.0 - 15.0 g/dL   Patient temperature 98.6 F    Collection site RADIAL, ALLEN'S TEST ACCEPTABLE    Drawn by RT    Sample type ARTERIAL    Comment NOTIFIED PHYSICIAN   SARS Coronavirus 2 (CEPHEID - Performed in [place] hospital lab), [facility]  Status: None   Collection Time: 06/01/19 11:20 PM  Result Value Ref Range   SARS Coronavirus 2 NEGATIVE NEGATIVE    Comment: (NOTE) If result is NEGATIVE SARS-CoV-2 target nucleic acids are NOT DETECTED. The SARS-CoV-2 RNA is generally detectable in upper and lower  respiratory specimens during the acute phase of infection. The lowest  concentration of SARS-CoV-2 viral copies this assay can detect is 250  copies / mL. A negative result does not preclude SARS-CoV-2 infection  and should not be used as the sole basis for treatment or other  patient management decisions.  A negative result may occur with  improper specimen collection / handling, submission of specimen other  than nasopharyngeal swab, presence of viral mutation(s) within the  areas targeted by this assay, and inadequate number of viral copies  (<250 copies / mL). A negative result must be combined with clinical  observations, patient history, and epidemiological information. If result is POSITIVE SARS-CoV-2 target nucleic acids are DETECTED. The SARS-CoV-2 RNA is generally detectable in upper and lower  respiratory specimens dur ing the acute phase of infection.  Positive  results are indicative of active infection with SARS-CoV-2.  Clinical  correlation with patient history and other diagnostic information is  necessary to determine patient infection status.  Positive results do  not rule out  bacterial infection or co-infection with other viruses. If result is PRESUMPTIVE POSTIVE SARS-CoV-2 nucleic acids MAY BE PRESENT.   A presumptive positive result was obtained on the submitted specimen  and confirmed on repeat testing.  While 2019 novel coronavirus  (SARS-CoV-2) nucleic acids may be present in the submitted sample  additional confirmatory testing may be necessary for epidemiological  and / or clinical management purposes  to differentiate between  SARS-CoV-2 and other Sarbecovirus currently known to infect humans.  If clinically indicated additional testing with an alternate test  methodology [id number]) is advised. The SARS-CoV-2 RNA is generally  detectable in upper and lower respiratory sp ecimens during the acute  phase of infection. The expected result is Negative. Fact Sheet for Patients:  StrictlyIdeas.no Fact Sheet for Healthcare Providers: BankingDealers.co.za This test is not yet approved or cleared by the [place] FDA and has been authorized for detection and/or diagnosis of SARS-CoV-2 by FDA under an Emergency Use Authorization (EUA).  This EUA will remain in effect (meaning this test can be used) for the duration of the COVID-19 declaration under Section 564(b)(1) of the Act, 21 U.S.C. section 360bbb-3(b)(1), unless the authorization is terminated or revoked sooner. Performed at [facility], [place] [address], [place], [place] [address]   CBC with Differential/Platelet     Status: Abnormal   Collection Time: 06/01/19 11:22 PM  Result Value Ref Range   WBC 9.9 4.0 - 10.5 K/uL   RBC 4.30 3.87 - 5.11 MIL/uL   Hemoglobin 10.3 (L) 12.0 - 15.0 g/dL   HCT 36.4 36.0 - 46.0 %   MCV 84.7 80.0 - 100.0 fL   MCH 24.0 (L) 26.0 - 34.0 pg   MCHC 28.3 (L) 30.0 - 36.0 g/dL   RDW 18.2 (H) 11.5 - 15.5 %   Platelets 467 (H) 150 - 400 K/uL   nRBC 0.4 (H) 0.0 - 0.2 %   Neutrophils Relative % 53 %   Neutro Abs 5.2 1.7  - 7.7 K/uL   Lymphocytes Relative 34 %   Lymphs Abs 3.3 0.7 - 4.0 K/uL   Monocytes Relative 8 %   Monocytes Absolute 0.8 0.1 - 1.0  K/uL   Eosinophils Relative 3 %   Eosinophils Absolute 0.3 0.0 - 0.5 K/uL   Basophils Relative 1 %   Basophils Absolute 0.1 0.0 - 0.1 K/uL   Immature Granulocytes 1 %   Abs Immature Granulocytes 0.12 (H) 0.00 - 0.07 K/uL    Comment: Performed at [facility] [address], [place], [place] [address]  Basic metabolic panel     Status: Abnormal   Collection Time: 06/01/19 11:22 PM  Result Value Ref Range   Sodium 144 135 - 145 mmol/L   Potassium 4.8 3.5 - 5.1 mmol/L   Chloride 112 (H) 98 - 111 mmol/L   CO2 15 (L) 22 - 32 mmol/L   Glucose, Bld 380 (H) 70 - 99 mg/dL   BUN 37 (H) 8 - 23 mg/dL   Creatinine, Ser 3.42 (H) 0.44 - 1.00 mg/dL   Calcium 9.5 8.9 - 10.3 mg/dL   GFR calc non Af Amer 13 (L) >60 mL/min   GFR calc Af Amer 15 (L) >60 mL/min   Anion gap 17 (H) 5 - 15    Comment: Performed at [facility] [address], [place], [place] [address]  Brain natriuretic peptide     Status: Abnormal   Collection Time: 06/01/19 11:22 PM  Result Value Ref Range   B Natriuretic Peptide 331.8 (H) 0.0 - 100.0 pg/mL    Comment: Performed at [facility] [address], [place], [place] [address]  Troponin I - ONCE - STAT     Status: Abnormal   Collection Time: 06/01/19 11:22 PM  Result Value Ref Range   Troponin I 0.05 (HH) <0.03 ng/mL    Comment: CRITICAL RESULT CALLED TO, READ BACK BY AND VERIFIED WITH: B.Shamela Haydon,RN 0025 06/02/2019 M.CAMPBELL Performed at [facility], [place] [address] [place], [place] [address]   I-STAT 7, (LYTES, BLD GAS, ICA, H+H)     Status: Abnormal   Collection Time: 06/02/19  1:35 AM  Result Value Ref Range   pH, Arterial 7.247 (L) 7.350 - 7.450   pCO2 arterial 43.8 32.0 - 48.0 mmHg   pO2, Arterial 94.0 83.0 - 108.0 mmHg   Bicarbonate 18.9 (L) 20.0 - 28.0 mmol/L   TCO2 20 (L) 22 - 32 mmol/L   O2  Saturation 96.0 %   Acid-base deficit 8.0 (H) 0.0 - 2.0 mmol/L   Sodium 142 135 - 145 mmol/L   Potassium 5.0 3.5 - 5.1 mmol/L   Calcium, Ion 1.27 1.15 - 1.40 mmol/L   HCT 31.0 (L) 36.0 - 46.0 %   Hemoglobin 10.5 (L) 12.0 - 15.0 g/dL   Patient temperature 99.8 F    Collection site RADIAL, ALLEN'S TEST ACCEPTABLE    Drawn by RT    Sample type ARTERIAL    Dg Chest Port 1 View  Result Date: 06/01/2019 CLINICAL DATA:  Shortness of breath EXAM: PORTABLE CHEST 1 VIEW COMPARISON:  04/27/2019 FINDINGS: Remote median sternotomy and CABG. There is moderate bilateral interstitial pulmonary edema. No pleural effusion. Moderate cardiomegaly. IMPRESSION: Moderate cardiomegaly and interstitial pulmonary edema. Electronically Signed   By: Ulyses Jarred M.D.   On: 06/01/2019 23:48    Pending Labs Unresulted Labs (From admission, onward)    Start     Ordered   06/02/19 0221  Urine rapid drug screen (hosp performed)  ONCE - STAT,   R     06/02/19 0220   06/02/19 0220  Urinalysis, Routine w reflex microscopic  Once,  R     06/02/19 0220          Vitals/Pain Today's Vitals   06/02/19 0130 06/02/19 0145 06/02/19 0200 06/02/19 0217  BP: (!) 147/77 125/70 (!) 142/67   Pulse: (!) 102 90 88   Resp:   20   Temp:      TempSrc:      SpO2: 96% 98% 99%   PainSc:   Asleep Asleep    Isolation Precautions No active isolations  Medications Medications  nitroGLYCERIN 50 mg in dextrose 5 % 250 mL (0.2 mg/mL) infusion (0 mcg/min Intravenous Stopped 06/02/19 0051)  furosemide (LASIX) injection 80 mg (80 mg Intravenous Given 06/01/19 2335)    Mobility manual wheelchair High fall risk   Focused Assessments Patient is on BIPAP.   R Recommendations: See Admitting Provider Note  Report given to:   Additional Notes:

## 2019-06-03 DIAGNOSIS — I5023 Acute on chronic systolic (congestive) heart failure: Secondary | ICD-10-CM

## 2019-06-03 DIAGNOSIS — F1410 Cocaine abuse, uncomplicated: Secondary | ICD-10-CM

## 2019-06-03 DIAGNOSIS — F1910 Other psychoactive substance abuse, uncomplicated: Secondary | ICD-10-CM

## 2019-06-03 DIAGNOSIS — I160 Hypertensive urgency: Secondary | ICD-10-CM

## 2019-06-03 LAB — BASIC METABOLIC PANEL
Anion gap: 11 (ref 5–15)
BUN: 52 mg/dL — ABNORMAL HIGH (ref 8–23)
CO2: 19 mmol/L — ABNORMAL LOW (ref 22–32)
Calcium: 8.9 mg/dL (ref 8.9–10.3)
Chloride: 110 mmol/L (ref 98–111)
Creatinine, Ser: 3.57 mg/dL — ABNORMAL HIGH (ref 0.44–1.00)
GFR calc Af Amer: 14 mL/min — ABNORMAL LOW (ref >60)
GFR calc non Af Amer: 12 mL/min — ABNORMAL LOW (ref >60)
Glucose, Bld: 145 mg/dL — ABNORMAL HIGH (ref 70–99)
Potassium: 4.9 mmol/L (ref 3.5–5.1)
Sodium: 140 mmol/L (ref 135–145)

## 2019-06-03 LAB — GLUCOSE, CAPILLARY
Glucose-Capillary: 172 mg/dL — ABNORMAL HIGH (ref 70–99)
Glucose-Capillary: 147 mg/dL — ABNORMAL HIGH (ref 70–99)

## 2019-06-03 MED ORDER — ATORVASTATIN CALCIUM 40 MG PO TABS: 40.0000 mg | ORAL_TABLET | Freq: Every day | ORAL | 0 refills | Status: DC

## 2019-06-03 MED ORDER — FUROSEMIDE 80 MG PO TABS: 80.0000 mg | ORAL_TABLET | Freq: Every day | ORAL | Status: DC

## 2019-06-03 MED ORDER — INSULIN GLARGINE 100 UNIT/ML SOLOSTAR PEN: 21.0000 [IU] | PEN_INJECTOR | Freq: Every day | SUBCUTANEOUS | Status: DC

## 2019-06-03 MED ORDER — FUROSEMIDE 40 MG PO TABS: 40.0000 mg | ORAL_TABLET | Freq: Every day | ORAL | Status: DC

## 2019-06-03 MED ORDER — FUROSEMIDE 40 MG PO TABS: 40.0000 mg | ORAL_TABLET | ORAL | 0 refills | Status: DC

## 2019-06-03 MED ORDER — GABAPENTIN 300 MG PO CAPS: 300.0000 mg | ORAL_CAPSULE | Freq: Three times a day (TID) | ORAL | 0 refills | Status: DC

## 2019-06-03 MED ORDER — FUROSEMIDE 40 MG PO TABS: 40.0000 mg | ORAL_TABLET | ORAL | Status: DC

## 2019-06-03 NOTE — Discharge Summary (Signed)
Physician Discharge Summary  [name] [name] ZJI:[id number] DOB: January 10, 1951  PCP: [place] date: 06/01/2019 Discharge date: 06/03/2019  Recommendations for Outpatient Follow-up:  1. PCP at [facility] in 1 week with repeat labs (CBC & BMP). 2. Dr. Peter Martinique, Cardiology in 2 weeks.  Office will arrange follow-up. 3. Dr. Pearson Grippe, Nephrology in 2 weeks.  Home Health: None Equipment/Devices: None  Discharge Condition: Improved and stable CODE STATUS: Full Diet recommendation: Heart healthy & diabetic diet.  Discharge Diagnoses:  Principal Problem:   Acute respiratory failure with hypoxia and hypercapnia (HCC) Active Problems:   History of CVA (cerebrovascular accident)   Essential hypertension   Type 2 diabetes mellitus with renal manifestations, controlled (HCC)   Acute on chronic combined systolic and diastolic congestive heart failure (HCC)   CKD (chronic kidney disease) stage 4, GFR 15-29 ml/min (HCC)   Substance abuse (HCC)   Elevated troponin   Hypertensive crisis   Acute on chronic combined systolic and diastolic CHF (congestive heart failure) (HCC)   Brief Summary: 68 year old female with PMH of ongoing cocaine abuse, CAD status post CABG, chronic combined systolic and diastolic CHF, type II DM/IDDM, HTN, HLD, stroke, GERD, gout, iron deficiency anemia, tobacco abuse, depression, COPD not on home oxygen, stage IV chronic kidney disease, recent hospitalization 04/27/2019-04/30/2019 for NSTEMI for which Cardiology consulted and recommended medical management due to ongoing cocaine use, no beta-blockers in the setting of cocaine use (although noted metoprolol on her home medication list), presented to [facility] ED on 6/3 due to acute onset of dyspnea that started an hour after she smoked cocaine with her friends.  In the ED, hypoxic, tachypneic, tachycardic and initial BP 200/100, placed on BiPAP, admitted for acute on chronic  combined CHF, acute hypoxic and hypercapnic respiratory failure due to cocaine use.  Cardiology consulted 6/4 for elevated troponin and transient Mobitz type II second-degree AV block both of which managed conservatively.   Assessment & Plan:   Acute on chronic combined systolic and diastolic CHF  Most likely precipitated by cocaine abuse and associated hypertensive urgency.  TTE 04/28/2019: LVEF 45-50% and severely increased left ventricular wall thickness.  Placed on nitroglycerin drip, BiPAP, started on IV Lasix 80 mg every 12 hourly.  Weaned off NTG drip yesterday morning.  Did not require BiPAP last night and weaned off oxygen to room air.  Clinically improved and volume status significantly better.  Intake output charting is inaccurate.  Cardiology follow-up appreciated.  I discussed with Cardiology who recommended continuing prior home dose of Lasix and metolazone which patient confirms that she was taking at home.  Patient advised to follow-up with her PCP next week for repeat BMP given history of chronic kidney disease and she verbalizes understanding.  Metoprolol discontinued this admission.  Acute hypoxic and hypercarbic respiratory failure  Due to decompensated CHF complicating underlying COPD.  BiPAP overnight of admission, discontinued 6/4.  Monitor on oxygen via nasal cannula and wean as tolerated for saturation >92%.  No clinical bronchospasm.  SARS coronavirus 2 testing negative.  Did not require BiPAP last night.  Weaned to room air.  No hypoxia even with activity.  Elevated troponin/CAD status post CABG/recent NSTEMI  Secondary to demand ischemia from cocaine abuse, decompensated CHF and acute respiratory failure on top of chronic kidney disease.  No chest pain.  Troponin with flat trends.  Cardiology consulted and no further evaluation planned.  Continue aspirin and statins.  No beta-blockers due to  cocaine.  Cardiology have switched pravastatin to  atorvastatin.  Transient Mobitz type II second-degree AV block  Noted on telemetry 6/4 at 5:49 AM.  Bradycardia at 38.  Avoid negative chronotropic.  Cardiology consulted and no further interventions planned.  This was transient one-time episode without recurrence.  COPD  No clinical bronchospasm.  Stable.  Type II DM/IDDM with renal complications  D1S 6.6 in April 2020.  Patient confirms that she continues to take Amaryl and Lantus at home.  No hypoglycemic episodes reported.  Follow-up with PCP and consider stopping or reducing dose of Amaryl given risk of hypoglycemia with chronic kidney disease.  Hypertensive urgency  Blood pressure 200/100 on initial presentation.  Precipitated by cocaine use.  Placed on NTG infusion from ED, which was weaned off 6/4.  Continue amlodipine 5 mg daily  Reasonable control.  Outpatient follow-up.  Stage IV chronic kidney disease  Creatinine at recent discharge was 3.29.  Currently 3.34, at baseline.    Creatinine today has increased slightly to 3.57, likely reflection of IV diuresis.  Patient will be transitioned to prior home oral diuretics at discharge.  Close outpatient follow-up with PCP with repeat BMP next week and patient also advised to follow-up with her Nephrologist.  She verbalizes understanding.  Cocaine abuse  Extensively counseled by multiple providers regarding cessation.  Patient verbalized understanding.  GERD  Continue PPI.  Morbid obesity/Body mass index is 41.39 kg/m.  Iron deficiency anemia  Stable.  Leukocytosis  Likely related to stress response.  No clinical concern for infectious etiology.  Consultants:  Cardiology  Procedures:  BiPAP  Discharge Instructions  Discharge Instructions    (HEART FAILURE PATIENTS) Call MD:  Anytime you have any of the following symptoms: 1) 3 pound weight gain in 24 hours or 5 pounds in 1 week 2) shortness of breath, with or without a dry hacking  cough 3) swelling in the hands, feet or stomach 4) if you have to sleep on extra pillows at night in order to breathe.   Complete by:  As directed    Call MD for:  difficulty breathing, headache or visual disturbances   Complete by:  As directed    Call MD for:  extreme fatigue   Complete by:  As directed    Call MD for:  persistant dizziness or light-headedness   Complete by:  As directed    Call MD for:  persistant nausea and vomiting   Complete by:  As directed    Call MD for:  severe uncontrolled pain   Complete by:  As directed    Call MD for:  temperature >100.4   Complete by:  As directed    Diet - low sodium heart healthy   Complete by:  As directed    Diet Carb Modified   Complete by:  As directed    Discharge instructions   Complete by:  As directed    Weigh daily and call cardiology office if > 3 lb weight gain in 24 hr or >5 lb weight gain in the course of 1 week.   Increase activity slowly   Complete by:  As directed        Medication List    STOP taking these medications   metoprolol tartrate 25 MG tablet Commonly known as:  LOPRESSOR   pravastatin 40 MG tablet Commonly known as:  PRAVACHOL     TAKE these medications   allopurinol 100 MG tablet Commonly known as:  ZYLOPRIM Take 100 mg by  mouth daily.   amLODipine 5 MG tablet Commonly known as:  NORVASC Take 1 tablet (5 mg total) by mouth daily.   aspirin EC 325 MG tablet Take 325 mg by mouth daily.   atorvastatin 40 MG tablet Commonly known as:  LIPITOR Take 1 tablet (40 mg total) by mouth daily at 6 PM.   brinzolamide 1 % ophthalmic suspension Commonly known as:  AZOPT Place 1 drop into both eyes 3 (three) times daily.   buPROPion 150 MG 12 hr tablet Commonly known as:  WELLBUTRIN SR Take 1 tablet (150 mg total) by mouth 2 (two) times daily.   cetirizine 10 MG tablet Commonly known as:  ZYRTEC Take 10 mg by mouth daily as needed for allergies.   ferrous sulfate 325 (65 FE) MG tablet Take  325 mg by mouth 3 (three) times daily with meals.   furosemide 40 MG tablet Commonly known as:  LASIX Take 1-2 tablets (40-80 mg total) by mouth See admin instructions. Take 2 tablets in the morning and 1 tablet at bedtime   gabapentin 300 MG capsule Commonly known as:  NEURONTIN Take 300 mg by mouth 3 (three) times daily.   glimepiride 4 MG tablet Commonly known as:  AMARYL Take 4 mg by mouth daily.   Insulin Glargine 100 UNIT/ML Solostar Pen Commonly known as:  Lantus SoloStar Inject 21 Units into the skin daily at 10 pm.   latanoprost 0.005 % ophthalmic solution Commonly known as:  XALATAN Place 1 drop into both eyes at bedtime.   Linzess 72 MCG capsule Generic drug:  linaclotide Take 72 mcg by mouth daily before breakfast.   metolazone 5 MG tablet Commonly known as:  ZAROXOLYN Take 5 mg by mouth daily.   nitroGLYCERIN 0.4 MG SL tablet Commonly known as:  NITROSTAT Place 1 tablet (0.4 mg total) under the tongue every 5 (five) minutes as needed for chest pain.   pantoprazole 40 MG tablet Commonly known as:  PROTONIX Take 1 tablet (40 mg total) by mouth daily.   polyethylene glycol 17 g packet Commonly known as:  MIRALAX / GLYCOLAX Take 17 g by mouth daily as needed for mild constipation.   ProAir HFA 108 (90 Base) MCG/ACT inhaler Generic drug:  albuterol Inhale 1-2 puffs into the lungs every 6 (six) hours as needed for wheezing.   Wixela Inhub 250-50 MCG/DOSE Aepb Generic drug:  Fluticasone-Salmeterol Inhale 1 puff into the lungs 2 (two) times a day.      Follow-up Information    [facility] Schedule an appointment as soon as possible for a visit in 1 week(s).   Why:  To be seen with repeat labs (CBC & BMP). Contact information: [place] [address] [phone]        Rexene Agent, MD. Schedule an appointment as soon as possible for a visit in 2 week(s).   Specialty:  Nephrology Contact  information: [place] [address] [phone]        Lendon Colonel, NP .   Specialties:  Nurse Practitioner, Radiology, Cardiology Contact information: [address] [employer] [address] [place] [address] [phone]          Allergies  Allergen Reactions  . Naproxen Rash      Procedures/Studies: Dg Chest Port 1 View  Result Date: 06/01/2019 CLINICAL DATA:  Shortness of breath EXAM: PORTABLE CHEST 1 VIEW COMPARISON:  04/27/2019 FINDINGS: Remote median sternotomy and CABG. There is moderate bilateral interstitial  pulmonary edema. No pleural effusion. Moderate cardiomegaly. IMPRESSION: Moderate cardiomegaly and interstitial pulmonary edema. Electronically Signed   By: Ulyses Jarred M.D.   On: 06/01/2019 23:48      Subjective: Patient reports that she feels much better.  Has been urinating well.  Has been ambulating by herself to the toilet without chest pain, dyspnea, dizziness or lightheadedness.  Reports breathing is almost back to baseline.  Has some "head cold" and runny nose.  Leg swelling significantly improved and legs may also be back almost to baseline.  Discharge Exam:  Vitals:   06/02/19 2247 06/02/19 2300 06/03/19 0551 06/03/19 0736  BP: (!) 133/59  (!) 142/66 138/70  Pulse: 79 80 73 73  Resp: 16 15 17 18   Temp: 98.4 F (36.9 C)  98.3 F (36.8 C) 98.3 F (36.8 C)  TempSrc: Oral  Oral Oral  SpO2: 100% 100% 94% 97%  Weight:   124 kg   Height:        General exam: Middle-aged female, moderately built and morbidly obese sitting up comfortably in bed. Respiratory system:  Clear to auscultation.  No increased work of breathing. Cardiovascular system: S1 & S2 heard, RRR. No JVD, murmurs, rubs, gallops or clicks.  Trace bilateral ankle but no leg edema.  Telemetry personally reviewed: Sinus rhythm.  No further episodes of heart block noted. Gastrointestinal system: Abdomen is nondistended, soft and nontender. No organomegaly or masses felt.  Normal bowel sounds heard. Central nervous system: Alert and oriented. No focal neurological deficits. Extremities: Symmetric 5 x 5 power. Skin: No rashes, lesions or ulcers Psychiatry: Judgement and insight appear normal. Mood & affect appropriate.     The results of significant diagnostics from this hospitalization (including imaging, microbiology, ancillary and laboratory) are listed below for reference.     Microbiology: Recent Results (from the past 240 hour(s))  SARS Coronavirus 2 (CEPHEID - Performed in [address] hospital lab), Hosp Order     Status: None   Collection Time: 06/01/19 11:20 PM  Result Value Ref Range Status   SARS Coronavirus 2 NEGATIVE NEGATIVE Final    Comment: (NOTE) If result is NEGATIVE SARS-CoV-2 target nucleic acids are NOT DETECTED. The SARS-CoV-2 RNA is generally detectable in upper and lower  respiratory specimens during the acute phase of infection. The lowest  concentration of SARS-CoV-2 viral copies this assay can detect is 250  copies / mL. A negative result does not preclude SARS-CoV-2 infection  and should not be used as the sole basis for treatment or other  patient management decisions.  A negative result may occur with  improper specimen collection / handling, submission of specimen other  than nasopharyngeal swab, presence of viral mutation(s) within the  areas targeted by this assay, and inadequate number of viral copies  (<250 copies / mL). A negative result must be combined with clinical  observations, patient history, and epidemiological information. If result is POSITIVE SARS-CoV-2 target nucleic acids are DETECTED. The SARS-CoV-2 RNA is generally detectable in upper and lower  respiratory specimens dur ing the acute phase of infection.  Positive  results are indicative of active infection with SARS-CoV-2.  Clinical  correlation with patient history and other diagnostic information is  necessary to determine patient infection  status.  Positive results do  not rule out bacterial infection or co-infection with other viruses. If result is PRESUMPTIVE POSTIVE SARS-CoV-2 nucleic acids MAY BE PRESENT.   A presumptive positive result was obtained on the submitted specimen  and confirmed on repeat  testing.  While 2019 novel coronavirus  (SARS-CoV-2) nucleic acids may be present in the submitted sample  additional confirmatory testing may be necessary for epidemiological  and / or clinical management purposes  to differentiate between  SARS-CoV-2 and other Sarbecovirus currently known to infect humans.  If clinically indicated additional testing with an alternate test  methodology [phone]) is advised. The SARS-CoV-2 RNA is generally  detectable in upper and lower respiratory sp ecimens during the acute  phase of infection. The expected result is Negative. Fact Sheet for Patients:  StrictlyIdeas.no Fact Sheet for Healthcare Providers: BankingDealers.co.za This test is not yet approved or cleared by the [place] FDA and has been authorized for detection and/or diagnosis of SARS-CoV-2 by FDA under an Emergency Use Authorization (EUA).  This EUA will remain in effect (meaning this test can be used) for the duration of the COVID-19 declaration under Section 564(b)(1) of the Act, 21 U.S.C. section 360bbb-3(b)(1), unless the authorization is terminated or revoked sooner. Performed at [facility], [place] [address] [place], [place] [address]      Labs: CBC: Recent Labs  Lab 06/01/19 2316 06/01/19 2322 06/02/19 0135 06/02/19 0557  WBC  --  9.9  --  16.1*  NEUTROABS  --  5.2  --  15.3*  HGB 11.6* 10.3* 10.5* 9.4*  HCT 34.0* 36.4 31.0* 31.5*  MCV  --  84.7  --  82.0  PLT  --  467*  --  762   Basic Metabolic Panel: Recent Labs  Lab 06/01/19 2316 06/01/19 2322 06/02/19 0135 06/02/19 0932 06/03/19 0218  NA 142 144 142 142 140  K 4.8 4.8 5.0 4.7 4.9   CL  --  112*  --  111 110  CO2  --  15*  --  20* 19*  GLUCOSE  --  380*  --  257* 145*  BUN  --  37*  --  41* 52*  CREATININE  --  3.42*  --  3.34* 3.57*  CALCIUM  --  9.5  --  9.3 8.9   BNP (last 3 results) Recent Labs    04/27/19 1942 06/01/19 2322  BNP 619.8* 331.8*   Cardiac Enzymes: Recent Labs  Lab 06/01/19 2322 06/02/19 0557 06/02/19 0932 06/02/19 1549  TROPONINI 0.05* 0.18* 0.19* 0.22*   CBG: Recent Labs  Lab 06/02/19 1131 06/02/19 1625 06/02/19 2116 06/03/19 0553 06/03/19 1034  GLUCAP 264* 205* 138* 172* 147*      Time coordinating discharge: 40 minutes  SIGNED:  Vernell Leep, MD, FACP, [facility]. [facility]  To contact the attending provider between 7A-7P or the covering provider during after hours 7P-7A, please log into the web site [email or web address] and access using universal [place] password for that web site. If you do not have the password, please call the hospital operator.

## 2019-06-03 NOTE — Plan of Care (Signed)

## 2019-06-03 NOTE — Progress Notes (Addendum)
Progress Note  Patient Name: [name] [name] Date of Encounter: 06/03/2019  Primary Cardiologist: Peter Martinique, MD   Subjective   Continues to get better from a breathing standpoint. Came in requiring Bipap>>[place]>> RA. No additional supplemental O2 requirements. O2 sats 98% on RA currently. No CP. Only complaint is nasal/sinus congestion.   Inpatient Medications    Scheduled Meds:  allopurinol  100 mg Oral Daily   amLODipine  5 mg Oral Daily   aspirin EC  325 mg Oral Daily   atorvastatin  40 mg Oral q1800   brinzolamide  1 drop Both Eyes TID   buPROPion  150 mg Oral BID   chlorhexidine  15 mL Mouth Rinse BID   ferrous sulfate  325 mg Oral TID WC   furosemide  80 mg Intravenous BID   gabapentin  300 mg Oral TID   heparin  5,000 Units Subcutaneous Q8H   insulin aspart  0-5 Units Subcutaneous QHS   insulin aspart  0-9 Units Subcutaneous TID WC   insulin glargine  18 Units Subcutaneous QHS   latanoprost  1 drop Both Eyes QHS   linaclotide  72 mcg Oral QAC breakfast   mouth rinse  15 mL Mouth Rinse q12n4p   mometasone-formoterol  2 puff Inhalation BID   pantoprazole  40 mg Oral Daily   sodium chloride flush  3 mL Intravenous Q12H   Continuous Infusions:  sodium chloride     PRN Meds: sodium chloride, acetaminophen, albuterol, nitroGLYCERIN, ondansetron (ZOFRAN) IV, polyethylene glycol, sodium chloride flush   Vital Signs    Vitals:   06/02/19 2247 06/02/19 2300 06/03/19 0551 06/03/19 0736  BP: (!) 133/59  (!) 142/66 138/70  Pulse: 79 80 73 73  Resp: 16 15 17 18   Temp: 98.4 F (36.9 C)  98.3 F (36.8 C) 98.3 F (36.8 C)  TempSrc: Oral  Oral Oral  SpO2: 100% 100% 94% 97%  Weight:   124 kg   Height:        Intake/Output Summary (Last 24 hours) at 06/03/2019 0802 Last data filed at 06/02/2019 2200 Gross per 24 hour  Intake 1209.33 ml  Output 925 ml  Net 284.33 ml   Last 3 Weights 06/03/2019 06/02/2019 06/02/2019  Weight (lbs) 273 lb 5.9 oz 276 lb 3.8  oz 276 lb 3.8 oz  Weight (kg) 124 kg 125.3 kg 125.3 kg      Telemetry    NSR 80s - Personally Reviewed  ECG    NSR 76 bpm, old lateral infarct, q waves in V5 and V5 - Personally Reviewed  Physical Exam   GEN: obese, late middle aged AAF in No acute distress.   Neck: No JVD Cardiac: RRR, no murmurs, rubs, or gallops.  Respiratory: Clear to auscultation bilaterally. GI: Soft, nontender, non-distended  MS: No edema; No deformity. Neuro:  Nonfocal  Psych: Normal affect   Labs    Chemistry Recent Labs  Lab 06/01/19 2322 06/02/19 0135 06/02/19 0932 06/03/19 0218  NA 144 142 142 140  K 4.8 5.0 4.7 4.9  CL 112*  --  111 110  CO2 15*  --  20* 19*  GLUCOSE 380*  --  257* 145*  BUN 37*  --  41* 52*  CREATININE 3.42*  --  3.34* 3.57*  CALCIUM 9.5  --  9.3 8.9  GFRNONAA 13*  --  13* 12*  GFRAA 15*  --  16* 14*  ANIONGAP 17*  --  11 11     Hematology  Recent Labs  Lab 06/01/19 2322 06/02/19 0135 06/02/19 0557  WBC 9.9  --  16.1*  RBC 4.30  --  3.84*  HGB 10.3* 10.5* 9.4*  HCT 36.4 31.0* 31.5*  MCV 84.7  --  82.0  MCH 24.0*  --  24.5*  MCHC 28.3*  --  29.8*  RDW 18.2*  --  18.1*  PLT 467*  --  340    Cardiac Enzymes Recent Labs  Lab 06/01/19 2322 06/02/19 0557 06/02/19 0932 06/02/19 1549  TROPONINI 0.05* 0.18* 0.19* 0.22*   No results for input(s): TROPIPOC in the last 168 hours.   BNP Recent Labs  Lab 06/01/19 2322  BNP 331.8*     DDimer No results for input(s): DDIMER in the last 168 hours.   Radiology    Dg Chest Port 1 View  Result Date: 06/01/2019 CLINICAL DATA:  Shortness of breath EXAM: PORTABLE CHEST 1 VIEW COMPARISON:  04/27/2019 FINDINGS: Remote median sternotomy and CABG. There is moderate bilateral interstitial pulmonary edema. No pleural effusion. Moderate cardiomegaly. IMPRESSION: Moderate cardiomegaly and interstitial pulmonary edema. Electronically Signed   By: Ulyses Jarred M.D.   On: 06/01/2019 23:48    Cardiac Studies   2D  echocardiogram 04/28/2019 IMPRESSIONS   1. The left ventricle has mildly reduced systolic function, with an ejection fraction of 45-50%. The cavity size was normal. There is severely increased left ventricular wall thickness. Left ventricular diastolic Doppler parameters are consistent with  pseudonormalization. 2. The right ventricle has normal systolic function. The cavity was normal. There is no increase in right ventricular wall thickness. 3. Left atrial size was mildly dilated. 4. Mild thickening of the mitral valve leaflet. 5. No stenosis of the aortic valve. 6. The interatrial septum was not assessed.   Patient Profile     [name] [name] is a 68 y.o. female with a hx of CAD, status post CABG in 2952, chronic systolic heart failure, substance abuse, diabetes, CKD, obesity and history of CVA who is being seen today for the evaluation of chest pain and CHF in the setting of recent cocaine use at the request of Dr. Algis Liming, Internal Medicine.  Assessment & Plan    1.  Cocaine Use: Ongoing issue.  Second hospitalization for chest pain/CHF w/ cocaine use in the last 4 weeks. She admitted in the ED to using cocaine prior to onset of recent symptoms.  Needs to avoid further use.  Continue to avoid beta-blockers.  2. CAD: Status post CABG in 2013.  Her last cardiac catheterization in 2016 showed patent grafts. CP free this morning. She denies any recent prolonged CP. Just twinges of CP every now and then. Sharp and last only a couple of sec. Given pain characteristics and low troponin level, doubt plaque rupture. However she is at risk for coronary spasms given cocaine use. Patient needs to avoid further use of cocaine.  Continue to treat with Aspirin and statin.  No Plavix given history of AVMs.  No beta-blocker due to ongoing cocaine use.  3. Acute on chronic systolic heart failure: Most recent echocardiogram April 2020 showed mildly reduced EF at 45 to 50%, consistent with prior  studies.  Current acute CHF exacerbation is in the setting of recent cocaine use and severely elevated BP with initial pressure of 200/100.  BNP elevated at 331.  Chest x-ray showed moderate cardiomegaly and interstitial pulmonary edema.  She notes significant symptomatic improvement w/ diuresis. She reports good UOP but I/Os not accurately recorded.  She is now  off of BiPAP an not requiring [place] at the moment. O2 sats 98% on RA currently. No significant peripheral edema on exam. SCr bump overnight, from 3.34>>3.57.  Stop IV Lasix and transition back to PO. Her home dose is Lasix 80 mg in the AM and 40 mg at night. No ACE or ARB due to CKD.  No beta-blocker due to ongoing cocaine use. At time of d/c, she will need home instructions to weigh daily and call cardiology office if > 3 lb weight gain in 24 hr or >5 lb weight gain in the course of 1 week. Also advise low sodium diet.   4.  Hypertensive emergency: Severely elevated BP in the setting of cocaine use.  Troponin mildly elevated with flat trend consistent with demand ischemia.  Blood pressure improved with IV nitro.  This has been discontinued.  She is currently on amlodipine, 5 mg daily.  Last documented BP was 138/70 this morning. Plan amiodarone and PO lasix at discharge.   5.  Elevated troponin: 0.05>>0.18>>0.19.  No ischemic CP. Suspect demand ischemia in the setting of hypertensive emergency, acute CHF and cocaine use.  No plans for ischemic evaluation.  6. CKD: Her creatinine since 2017 has ranged from 2.5-3.4.  she reports good UOP w/ IV Lasix and is feeling better. Given bump in SCr overnight from 3.34>>3.57 + improvement in symptoms, recommend stopping IV Lasix and transitioning back to PO regimen, 80 mg/40 mg.   7. Mobitz Type II, Second Degree AV Block: transient. Noted on tele yesterday but pt asymptomatic. Currently NSR on tele. K WNL. ? May be secondary to cocaine use. Not on any AV nodal blocking agents. Continue to observe   8. T2DM:  on insulin. Management per IM.   9. Infectious Disease: COVID Negative.   For questions or updates, please contact [place] Please consult [email or web address] for contact info under        Signed, Lyda Jester, PA-C  06/03/2019, 8:02 AM    Attending Note:   The patient was seen and examined.  Agree with assessment and plan as noted above.  Changes made to the above note as needed.  Patient seen and independently examined with Lyda Jester, PA .   We discussed all aspects of the encounter. I agree with the assessment and plan as stated above.  1.  Acute on chronic systolic chf:   Exacerbated by Hypertensive emergency and cocaine use and CKD.   Sats are good on RA  Creatinine has increased slighlty  - agree stopping IV lasix and restarting home dose of  PO lasix . Marland Kitchen    2. Cocaine abuse:  Advised cessation      She is stable and may be discharged from a cardiac standpoint.   Follow up with Dr. Martinique / APP in a month .  Will also need to follow up with her primary MD and nephrology .     I have spent a total of 40 minutes with patient reviewing hospital  notes , telemetry, EKGs, labs and examining patient as well as establishing an assessment and plan that was discussed with the patient. > 50% of time was spent in direct patient care.   Thayer Headings, Brooke Bonito., MD, [place] 06/03/2019, 8:54 AM [address]  [place] Pager [phone]

## 2019-06-03 NOTE — Discharge Instructions (Addendum)

## 2019-06-03 NOTE — Plan of Care (Signed)
  Problem: Education: Goal: Knowledge of General Education information will improve Description Including pain rating scale, medication(s)/side effects and non-pharmacologic comfort measures 06/03/2019 1251 by Don Perking, RN Outcome: Completed/Met 06/03/2019 0841 by Don Perking, RN Outcome: Progressing   Problem: Health Behavior/Discharge Planning: Goal: Ability to manage health-related needs will improve 06/03/2019 1251 by Don Perking, RN Outcome: Completed/Met 06/03/2019 0841 by Don Perking, RN Outcome: Progressing   Problem: Clinical Measurements: Goal: Ability to maintain clinical measurements within normal limits will improve 06/03/2019 1251 by Don Perking, RN Outcome: Completed/Met 06/03/2019 0841 by Don Perking, RN Outcome: Progressing Goal: Will remain free from infection 06/03/2019 1251 by Don Perking, RN Outcome: Completed/Met 06/03/2019 0841 by Don Perking, RN Outcome: Progressing Goal: Diagnostic test results will improve 06/03/2019 1251 by Don Perking, RN Outcome: Completed/Met 06/03/2019 0841 by Don Perking, RN Outcome: Progressing Goal: Respiratory complications will improve 06/03/2019 1251 by Don Perking, RN Outcome: Completed/Met 06/03/2019 0841 by Don Perking, RN Outcome: Progressing Goal: Cardiovascular complication will be avoided 06/03/2019 1251 by Don Perking, RN Outcome: Completed/Met 06/03/2019 0841 by Don Perking, RN Outcome: Progressing   Problem: Activity: Goal: Risk for activity intolerance will decrease 06/03/2019 1251 by Don Perking, RN Outcome: Completed/Met 06/03/2019 0841 by Don Perking, RN Outcome: Progressing   Problem: Nutrition: Goal: Adequate nutrition will be maintained 06/03/2019 1251 by Don Perking, RN Outcome: Completed/Met 06/03/2019 0841 by Don Perking, RN Outcome: Progressing   Problem: Coping: Goal: Level of anxiety will  decrease 06/03/2019 1251 by Don Perking, RN Outcome: Completed/Met 06/03/2019 0841 by Don Perking, RN Outcome: Progressing   Problem: Elimination: Goal: Will not experience complications related to bowel motility 06/03/2019 1251 by Don Perking, RN Outcome: Completed/Met 06/03/2019 0841 by Don Perking, RN Outcome: Progressing Goal: Will not experience complications related to urinary retention 06/03/2019 1251 by Don Perking, RN Outcome: Completed/Met 06/03/2019 0841 by Don Perking, RN Outcome: Progressing   Problem: Pain Managment: Goal: General experience of comfort will improve 06/03/2019 1251 by Don Perking, RN Outcome: Completed/Met 06/03/2019 0841 by Don Perking, RN Outcome: Progressing   Problem: Safety: Goal: Ability to remain free from injury will improve 06/03/2019 1251 by Don Perking, RN Outcome: Completed/Met 06/03/2019 0841 by Don Perking, RN Outcome: Progressing   Problem: Skin Integrity: Goal: Risk for impaired skin integrity will decrease 06/03/2019 1251 by Don Perking, RN Outcome: Completed/Met 06/03/2019 0841 by Don Perking, RN Outcome: Progressing

## 2019-06-03 NOTE — Progress Notes (Signed)
SATURATION QUALIFICATIONS: (This note is used to comply with regulatory documentation for home oxygen)  Patient Saturations on Room Air at Rest = 98%  Patient Saturations on Room Air while Ambulating =94%     

## 2019-06-16 ENCOUNTER — Telehealth: Payer: Self-pay

## 2019-06-16 NOTE — Telephone Encounter (Signed)
Tried multiple times to leave her a message on voice mail all numbers and recording says voice mail not set up yet. Even tried her Aunt and her voice mail was not set up either. Will try back later today.

## 2019-06-16 NOTE — Telephone Encounter (Signed)
-----   Message from Lothar Prehn E Martinique, [place] sent at 05/31/2019  4:12 PM EDT ----- Call and remind her to go for her COVID testing on 06/17/2019.

## 2019-06-16 NOTE — Telephone Encounter (Signed)
[name] [name] called back and I reminded her of the appointment.

## 2019-06-17 ENCOUNTER — Other Ambulatory Visit (HOSPITAL_COMMUNITY)
Admission: RE | Admit: 2019-06-17 | Discharge: 2019-06-17 | Disposition: A | Discharge status: Home / Self Care | Payer: Medicare Other | Source: Ambulatory Visit | Attending: Internal Medicine | Admitting: Internal Medicine

## 2019-06-17 DIAGNOSIS — Z1159 Encounter for screening for other viral diseases: Secondary | ICD-10-CM | POA: Insufficient documentation

## 2019-06-17 LAB — SARS CORONAVIRUS 2 (TAT 6-24 HRS): SARS Coronavirus 2: NEGATIVE

## 2019-06-20 ENCOUNTER — Encounter (HOSPITAL_COMMUNITY): Payer: Self-pay | Admitting: Physician Assistant

## 2019-06-20 ENCOUNTER — Telehealth: Payer: Self-pay | Admitting: Internal Medicine

## 2019-06-20 ENCOUNTER — Encounter (HOSPITAL_COMMUNITY): Payer: Self-pay

## 2019-06-20 DIAGNOSIS — R195 Other fecal abnormalities: Secondary | ICD-10-CM

## 2019-06-20 DIAGNOSIS — K5520 Angiodysplasia of colon without hemorrhage: Secondary | ICD-10-CM

## 2019-06-20 DIAGNOSIS — D500 Iron deficiency anemia secondary to blood loss (chronic): Secondary | ICD-10-CM

## 2019-06-20 NOTE — Telephone Encounter (Signed)
Unable to reach patient I will forward to Dr Carlean Purl and call [facility] in the AM to cancel. She told me earlier today she was going to try and get her sister to help pay for her prep.

## 2019-06-20 NOTE — Telephone Encounter (Signed)
Pt would like to reschedule procedure at [facility] tomorrow

## 2019-06-21 ENCOUNTER — Encounter (HOSPITAL_COMMUNITY): Admission: RE | Payer: Self-pay | Source: Home / Self Care

## 2019-06-21 ENCOUNTER — Ambulatory Visit (HOSPITAL_COMMUNITY): Admission: RE | Admit: 2019-06-21 | Payer: Medicare Other | Source: Home / Self Care | Admitting: Internal Medicine

## 2019-06-21 HISTORY — DX: Pneumonia, unspecified organism: J18.9

## 2019-06-21 SURGERY — COLONOSCOPY WITH PROPOFOL
Anesthesia: Monitor Anesthesia Care

## 2019-06-24 ENCOUNTER — Telehealth: Payer: Self-pay | Admitting: Adult Health

## 2019-06-24 NOTE — Telephone Encounter (Signed)
smartphone/ consent/ my chart declined/ pre reg completed  °

## 2019-06-27 NOTE — Progress Notes (Signed)
Error. No Show 

## 2019-06-28 ENCOUNTER — Telehealth: Payer: Medicare Other | Admitting: Adult Health

## 2019-06-28 NOTE — Telephone Encounter (Signed)
This encounter was created in error - please disregard.

## 2019-07-05 ENCOUNTER — Other Ambulatory Visit: Payer: Self-pay | Admitting: Internal Medicine

## 2019-07-05 MED ORDER — PLENVU 140 G PO SOLR: 1.0000 | Freq: Once | ORAL | 0 refills | Status: AC

## 2019-07-05 NOTE — Telephone Encounter (Signed)
I was unable to get Viki at either of her numbers and she doesn't have voice mail. I reached out to her Rosalin Hawking and she will try to get her to call us.

## 2019-07-05 NOTE — Telephone Encounter (Signed)
We need to circle back with her and try to get hher a prep sample (any type0 and colonoscopy set up again  I am willing to do during hospital week  Avoid 7/14

## 2019-07-05 NOTE — Telephone Encounter (Signed)
I have spoken with [name] [name] and she will come by here and pick up her PLENVU prep kit on 07/11/2019 after she does her drive thru [place] testing for her colonoscopy to be done 07/14/2019. I will put the instructions in with her sample kit.

## 2019-07-11 ENCOUNTER — Other Ambulatory Visit (HOSPITAL_COMMUNITY): Admission: RE | Admit: 2019-07-11 | Payer: Medicare Other | Source: Ambulatory Visit

## 2019-07-12 ENCOUNTER — Telehealth: Payer: Self-pay | Admitting: Internal Medicine

## 2019-07-12 ENCOUNTER — Other Ambulatory Visit (HOSPITAL_COMMUNITY): Payer: Medicare Other

## 2019-07-12 NOTE — Telephone Encounter (Signed)
Patient unable to get a ride to the [place] screening so her procedure for 07/14/2019 has been cancelled. I will have to regroup with Dr Carlean Purl and figure out another date/time. Tried his 07/19/2019 date but that won't work time wise for his schedule. I have informed Kaytlen that I will call her back with the date/time and she is aware we have her a prep kit to pick up along with the new instructions.

## 2019-07-12 NOTE — Progress Notes (Signed)
I spoke with [name] [name] and she stated she is going to call her Dr. Because she needs to reschedule her procedure therefore no need for Covid 19 test until procedure is rescheduled.

## 2019-07-12 NOTE — Telephone Encounter (Signed)
Patient call and stated that she is returning your call

## 2019-07-14 ENCOUNTER — Encounter (HOSPITAL_COMMUNITY): Admission: RE | Payer: Self-pay | Source: Home / Self Care

## 2019-07-14 ENCOUNTER — Ambulatory Visit (HOSPITAL_COMMUNITY): Admission: RE | Admit: 2019-07-14 | Payer: Medicare Other | Source: Home / Self Care | Admitting: Internal Medicine

## 2019-07-14 SURGERY — COLONOSCOPY WITH PROPOFOL
Anesthesia: Monitor Anesthesia Care

## 2019-08-16 ENCOUNTER — Encounter (HOSPITAL_COMMUNITY): Payer: Self-pay | Admitting: *Deleted

## 2019-08-16 ENCOUNTER — Inpatient Hospital Stay (HOSPITAL_COMMUNITY)
Admission: EM | Admit: 2019-08-16 | Discharge: 2019-08-26 | DRG: 981 | Disposition: A | Discharge status: Home Health | Payer: Medicare Other | Attending: Internal Medicine | Admitting: Internal Medicine

## 2019-08-16 ENCOUNTER — Emergency Department (HOSPITAL_COMMUNITY): Payer: Medicare Other

## 2019-08-16 ENCOUNTER — Other Ambulatory Visit: Payer: Self-pay

## 2019-08-16 DIAGNOSIS — F329 Major depressive disorder, single episode, unspecified: Secondary | ICD-10-CM | POA: Diagnosis present

## 2019-08-16 DIAGNOSIS — E872 Acidosis: Secondary | ICD-10-CM | POA: Diagnosis present

## 2019-08-16 DIAGNOSIS — S7002XA Contusion of left hip, initial encounter: Secondary | ICD-10-CM | POA: Diagnosis present

## 2019-08-16 DIAGNOSIS — D500 Iron deficiency anemia secondary to blood loss (chronic): Secondary | ICD-10-CM

## 2019-08-16 DIAGNOSIS — D631 Anemia in chronic kidney disease: Secondary | ICD-10-CM | POA: Diagnosis present

## 2019-08-16 DIAGNOSIS — N185 Chronic kidney disease, stage 5: Secondary | ICD-10-CM | POA: Diagnosis not present

## 2019-08-16 DIAGNOSIS — K449 Diaphragmatic hernia without obstruction or gangrene: Secondary | ICD-10-CM | POA: Diagnosis present

## 2019-08-16 DIAGNOSIS — I252 Old myocardial infarction: Secondary | ICD-10-CM | POA: Diagnosis not present

## 2019-08-16 DIAGNOSIS — E1143 Type 2 diabetes mellitus with diabetic autonomic (poly)neuropathy: Secondary | ICD-10-CM | POA: Diagnosis present

## 2019-08-16 DIAGNOSIS — Z7984 Long term (current) use of oral hypoglycemic drugs: Secondary | ICD-10-CM

## 2019-08-16 DIAGNOSIS — Y92002 Bathroom of unspecified non-institutional (private) residence single-family (private) house as the place of occurrence of the external cause: Secondary | ICD-10-CM

## 2019-08-16 DIAGNOSIS — Z8673 Personal history of transient ischemic attack (TIA), and cerebral infarction without residual deficits: Secondary | ICD-10-CM

## 2019-08-16 DIAGNOSIS — Z888 Allergy status to other drugs, medicaments and biological substances status: Secondary | ICD-10-CM

## 2019-08-16 DIAGNOSIS — R339 Retention of urine, unspecified: Secondary | ICD-10-CM | POA: Diagnosis not present

## 2019-08-16 DIAGNOSIS — Z992 Dependence on renal dialysis: Secondary | ICD-10-CM

## 2019-08-16 DIAGNOSIS — M545 Low back pain, unspecified: Secondary | ICD-10-CM

## 2019-08-16 DIAGNOSIS — Z20828 Contact with and (suspected) exposure to other viral communicable diseases: Secondary | ICD-10-CM | POA: Diagnosis present

## 2019-08-16 DIAGNOSIS — M109 Gout, unspecified: Secondary | ICD-10-CM | POA: Diagnosis present

## 2019-08-16 DIAGNOSIS — I959 Hypotension, unspecified: Secondary | ICD-10-CM | POA: Diagnosis not present

## 2019-08-16 DIAGNOSIS — I639 Cerebral infarction, unspecified: Secondary | ICD-10-CM | POA: Diagnosis present

## 2019-08-16 DIAGNOSIS — I5042 Chronic combined systolic (congestive) and diastolic (congestive) heart failure: Secondary | ICD-10-CM | POA: Diagnosis present

## 2019-08-16 DIAGNOSIS — K222 Esophageal obstruction: Secondary | ICD-10-CM

## 2019-08-16 DIAGNOSIS — K5520 Angiodysplasia of colon without hemorrhage: Secondary | ICD-10-CM | POA: Diagnosis not present

## 2019-08-16 DIAGNOSIS — D509 Iron deficiency anemia, unspecified: Secondary | ICD-10-CM | POA: Diagnosis present

## 2019-08-16 DIAGNOSIS — R509 Fever, unspecified: Secondary | ICD-10-CM | POA: Diagnosis present

## 2019-08-16 DIAGNOSIS — Z951 Presence of aortocoronary bypass graft: Secondary | ICD-10-CM | POA: Diagnosis not present

## 2019-08-16 DIAGNOSIS — I700 Atherosclerosis of aorta: Secondary | ICD-10-CM | POA: Diagnosis present

## 2019-08-16 DIAGNOSIS — Z8349 Family history of other endocrine, nutritional and metabolic diseases: Secondary | ICD-10-CM

## 2019-08-16 DIAGNOSIS — I2510 Atherosclerotic heart disease of native coronary artery without angina pectoris: Secondary | ICD-10-CM | POA: Diagnosis present

## 2019-08-16 DIAGNOSIS — I5089 Other heart failure: Secondary | ICD-10-CM | POA: Diagnosis not present

## 2019-08-16 DIAGNOSIS — D649 Anemia, unspecified: Secondary | ICD-10-CM | POA: Diagnosis not present

## 2019-08-16 DIAGNOSIS — N189 Chronic kidney disease, unspecified: Secondary | ICD-10-CM

## 2019-08-16 DIAGNOSIS — Z6841 Body Mass Index (BMI) 40.0 and over, adult: Secondary | ICD-10-CM | POA: Diagnosis not present

## 2019-08-16 DIAGNOSIS — H409 Unspecified glaucoma: Secondary | ICD-10-CM | POA: Diagnosis present

## 2019-08-16 DIAGNOSIS — Z833 Family history of diabetes mellitus: Secondary | ICD-10-CM

## 2019-08-16 DIAGNOSIS — I714 Abdominal aortic aneurysm, without rupture: Secondary | ICD-10-CM | POA: Diagnosis present

## 2019-08-16 DIAGNOSIS — Z79899 Other long term (current) drug therapy: Secondary | ICD-10-CM

## 2019-08-16 DIAGNOSIS — I132 Hypertensive heart and chronic kidney disease with heart failure and with stage 5 chronic kidney disease, or end stage renal disease: Secondary | ICD-10-CM | POA: Diagnosis present

## 2019-08-16 DIAGNOSIS — R8271 Bacteriuria: Secondary | ICD-10-CM | POA: Diagnosis present

## 2019-08-16 DIAGNOSIS — M19012 Primary osteoarthritis, left shoulder: Secondary | ICD-10-CM | POA: Diagnosis present

## 2019-08-16 DIAGNOSIS — R296 Repeated falls: Secondary | ICD-10-CM | POA: Diagnosis present

## 2019-08-16 DIAGNOSIS — E7800 Pure hypercholesterolemia, unspecified: Secondary | ICD-10-CM | POA: Diagnosis not present

## 2019-08-16 DIAGNOSIS — I130 Hypertensive heart and chronic kidney disease with heart failure and stage 1 through stage 4 chronic kidney disease, or unspecified chronic kidney disease: Secondary | ICD-10-CM | POA: Diagnosis not present

## 2019-08-16 DIAGNOSIS — E1122 Type 2 diabetes mellitus with diabetic chronic kidney disease: Secondary | ICD-10-CM | POA: Diagnosis present

## 2019-08-16 DIAGNOSIS — R0902 Hypoxemia: Secondary | ICD-10-CM

## 2019-08-16 DIAGNOSIS — N179 Acute kidney failure, unspecified: Secondary | ICD-10-CM | POA: Diagnosis present

## 2019-08-16 DIAGNOSIS — Z419 Encounter for procedure for purposes other than remedying health state, unspecified: Secondary | ICD-10-CM

## 2019-08-16 DIAGNOSIS — Z794 Long term (current) use of insulin: Secondary | ICD-10-CM

## 2019-08-16 DIAGNOSIS — D72829 Elevated white blood cell count, unspecified: Secondary | ICD-10-CM | POA: Diagnosis present

## 2019-08-16 DIAGNOSIS — K3184 Gastroparesis: Secondary | ICD-10-CM | POA: Diagnosis present

## 2019-08-16 DIAGNOSIS — K922 Gastrointestinal hemorrhage, unspecified: Secondary | ICD-10-CM | POA: Diagnosis present

## 2019-08-16 DIAGNOSIS — E785 Hyperlipidemia, unspecified: Secondary | ICD-10-CM | POA: Diagnosis present

## 2019-08-16 DIAGNOSIS — N186 End stage renal disease: Secondary | ICD-10-CM | POA: Diagnosis not present

## 2019-08-16 DIAGNOSIS — Z7951 Long term (current) use of inhaled steroids: Secondary | ICD-10-CM

## 2019-08-16 DIAGNOSIS — R195 Other fecal abnormalities: Secondary | ICD-10-CM | POA: Diagnosis not present

## 2019-08-16 DIAGNOSIS — E1151 Type 2 diabetes mellitus with diabetic peripheral angiopathy without gangrene: Secondary | ICD-10-CM | POA: Diagnosis present

## 2019-08-16 DIAGNOSIS — Z823 Family history of stroke: Secondary | ICD-10-CM

## 2019-08-16 DIAGNOSIS — W19XXXA Unspecified fall, initial encounter: Secondary | ICD-10-CM

## 2019-08-16 DIAGNOSIS — N184 Chronic kidney disease, stage 4 (severe): Secondary | ICD-10-CM | POA: Diagnosis not present

## 2019-08-16 DIAGNOSIS — N2581 Secondary hyperparathyroidism of renal origin: Secondary | ICD-10-CM | POA: Diagnosis present

## 2019-08-16 DIAGNOSIS — D62 Acute posthemorrhagic anemia: Secondary | ICD-10-CM | POA: Diagnosis present

## 2019-08-16 DIAGNOSIS — K210 Gastro-esophageal reflux disease with esophagitis: Secondary | ICD-10-CM | POA: Diagnosis present

## 2019-08-16 DIAGNOSIS — M4150 Other secondary scoliosis, site unspecified: Secondary | ICD-10-CM | POA: Diagnosis present

## 2019-08-16 DIAGNOSIS — F17210 Nicotine dependence, cigarettes, uncomplicated: Secondary | ICD-10-CM | POA: Diagnosis present

## 2019-08-16 DIAGNOSIS — Q2733 Arteriovenous malformation of digestive system vessel: Secondary | ICD-10-CM | POA: Diagnosis not present

## 2019-08-16 DIAGNOSIS — K209 Esophagitis, unspecified: Secondary | ICD-10-CM | POA: Diagnosis not present

## 2019-08-16 DIAGNOSIS — Z841 Family history of disorders of kidney and ureter: Secondary | ICD-10-CM

## 2019-08-16 DIAGNOSIS — W1809XA Striking against other object with subsequent fall, initial encounter: Secondary | ICD-10-CM | POA: Diagnosis present

## 2019-08-16 DIAGNOSIS — J449 Chronic obstructive pulmonary disease, unspecified: Secondary | ICD-10-CM | POA: Diagnosis present

## 2019-08-16 DIAGNOSIS — Z7982 Long term (current) use of aspirin: Secondary | ICD-10-CM

## 2019-08-16 DIAGNOSIS — Z809 Family history of malignant neoplasm, unspecified: Secondary | ICD-10-CM

## 2019-08-16 DIAGNOSIS — Z8249 Family history of ischemic heart disease and other diseases of the circulatory system: Secondary | ICD-10-CM

## 2019-08-16 DIAGNOSIS — M47816 Spondylosis without myelopathy or radiculopathy, lumbar region: Secondary | ICD-10-CM | POA: Diagnosis present

## 2019-08-16 HISTORY — DX: Other forms of acute ischemic heart disease: I24.89

## 2019-08-16 HISTORY — DX: Gastrointestinal hemorrhage, unspecified: K92.2

## 2019-08-16 HISTORY — DX: Cutaneous abscess, unspecified: L02.91

## 2019-08-16 HISTORY — DX: Other fecal abnormalities: R19.5

## 2019-08-16 HISTORY — DX: Other forms of acute ischemic heart disease: I24.8

## 2019-08-16 LAB — COMPREHENSIVE METABOLIC PANEL
ALT: 11 U/L (ref 0–44)
AST: 12 U/L — ABNORMAL LOW (ref 15–41)
Albumin: 3.2 g/dL — ABNORMAL LOW (ref 3.5–5.0)
Alkaline Phosphatase: 109 U/L (ref 38–126)
Anion gap: 13 (ref 5–15)
BUN: 132 mg/dL — ABNORMAL HIGH (ref 8–23)
CO2: 20 mmol/L — ABNORMAL LOW (ref 22–32)
Calcium: 8.6 mg/dL — ABNORMAL LOW (ref 8.9–10.3)
Chloride: 105 mmol/L (ref 98–111)
Creatinine, Ser: 6.11 mg/dL — ABNORMAL HIGH (ref 0.44–1.00)
GFR calc Af Amer: 8 mL/min — ABNORMAL LOW (ref >60)
GFR calc non Af Amer: 6 mL/min — ABNORMAL LOW (ref >60)
Glucose, Bld: 142 mg/dL — ABNORMAL HIGH (ref 70–99)
Potassium: 4.3 mmol/L (ref 3.5–5.1)
Sodium: 138 mmol/L (ref 135–145)
Total Bilirubin: 0.4 mg/dL (ref 0.3–1.2)
Total Protein: 6.8 g/dL (ref 6.5–8.1)

## 2019-08-16 LAB — URINALYSIS, ROUTINE W REFLEX MICROSCOPIC
Bilirubin Urine: NEGATIVE
Glucose, UA: NEGATIVE mg/dL
Hgb urine dipstick: NEGATIVE
Ketones, ur: NEGATIVE mg/dL
Nitrite: NEGATIVE
Protein, ur: NEGATIVE mg/dL
Specific Gravity, Urine: 1.01 (ref 1.005–1.030)
pH: 5 (ref 5.0–8.0)

## 2019-08-16 LAB — CBC WITH DIFFERENTIAL/PLATELET
Abs Immature Granulocytes: 0.06 10*3/uL (ref 0.00–0.07)
Basophils Absolute: 0 10*3/uL (ref 0.0–0.1)
Basophils Relative: 0 %
Eosinophils Absolute: 0.2 10*3/uL (ref 0.0–0.5)
Eosinophils Relative: 2 %
HCT: 25.8 % — ABNORMAL LOW (ref 36.0–46.0)
Hemoglobin: 7.3 g/dL — ABNORMAL LOW (ref 12.0–15.0)
Immature Granulocytes: 1 %
Lymphocytes Relative: 10 %
Lymphs Abs: 1.1 10*3/uL (ref 0.7–4.0)
MCH: 23 pg — ABNORMAL LOW (ref 26.0–34.0)
MCHC: 28.3 g/dL — ABNORMAL LOW (ref 30.0–36.0)
MCV: 81.1 fL (ref 80.0–100.0)
Monocytes Absolute: 0.8 10*3/uL (ref 0.1–1.0)
Monocytes Relative: 7 %
Neutro Abs: 8.7 10*3/uL — ABNORMAL HIGH (ref 1.7–7.7)
Neutrophils Relative %: 80 %
Platelets: 458 10*3/uL — ABNORMAL HIGH (ref 150–400)
RBC: 3.18 MIL/uL — ABNORMAL LOW (ref 3.87–5.11)
RDW: 18.9 % — ABNORMAL HIGH (ref 11.5–15.5)
WBC: 10.9 10*3/uL — ABNORMAL HIGH (ref 4.0–10.5)
nRBC: 0.2 % (ref 0.0–0.2)

## 2019-08-16 LAB — GLUCOSE, CAPILLARY
Glucose-Capillary: 43 mg/dL — CL (ref 70–99)
Glucose-Capillary: 47 mg/dL — ABNORMAL LOW (ref 70–99)
Glucose-Capillary: 65 mg/dL — ABNORMAL LOW (ref 70–99)
Glucose-Capillary: 107 mg/dL — ABNORMAL HIGH (ref 70–99)
Glucose-Capillary: 227 mg/dL — ABNORMAL HIGH (ref 70–99)

## 2019-08-16 LAB — IRON AND TIBC
Iron: 19 ug/dL — ABNORMAL LOW (ref 28–170)
Saturation Ratios: 6 % — ABNORMAL LOW (ref 10.4–31.8)
TIBC: 307 ug/dL (ref 250–450)
UIBC: 288 ug/dL

## 2019-08-16 LAB — SODIUM, URINE, RANDOM: Sodium, Ur: 33 mmol/L

## 2019-08-16 LAB — TROPONIN I (HIGH SENSITIVITY)
Troponin I (High Sensitivity): 34 ng/L — ABNORMAL HIGH (ref <18)
Troponin I (High Sensitivity): 32 ng/L — ABNORMAL HIGH (ref <18)

## 2019-08-16 LAB — RAPID URINE DRUG SCREEN, HOSP PERFORMED
Amphetamines: NOT DETECTED
Barbiturates: NOT DETECTED
Benzodiazepines: NOT DETECTED
Cocaine: POSITIVE — AB
Opiates: NOT DETECTED
Tetrahydrocannabinol: NOT DETECTED

## 2019-08-16 LAB — TSH: TSH: 1.821 u[IU]/mL (ref 0.350–4.500)

## 2019-08-16 LAB — POC OCCULT BLOOD, ED: Fecal Occult Bld: POSITIVE — AB

## 2019-08-16 LAB — SARS CORONAVIRUS 2 BY RT PCR (HOSPITAL ORDER, PERFORMED IN ~~LOC~~ HOSPITAL LAB): SARS Coronavirus 2: NEGATIVE

## 2019-08-16 LAB — CREATININE, URINE, RANDOM: Creatinine, Urine: 78.75 mg/dL

## 2019-08-16 LAB — VITAMIN B12: Vitamin B-12: 305 pg/mL (ref 180–914)

## 2019-08-16 LAB — FERRITIN: Ferritin: 14 ng/mL (ref 11–307)

## 2019-08-16 LAB — BRAIN NATRIURETIC PEPTIDE: B Natriuretic Peptide: 250.5 pg/mL — ABNORMAL HIGH (ref 0.0–100.0)

## 2019-08-16 LAB — OSMOLALITY, URINE: Osmolality, Ur: 325 mOsm/kg (ref 300–900)

## 2019-08-16 MED ORDER — ALLOPURINOL 100 MG PO TABS
100.0000 mg | ORAL_TABLET | Freq: Every day | ORAL | Status: DC
  Administered 2019-08-17 – 2019-08-26 (×10): 100 mg via ORAL
  Filled 2019-08-16 (×10): qty 1

## 2019-08-16 MED ORDER — INSULIN ASPART 100 UNIT/ML ~~LOC~~ SOLN
0.0000 [IU] | Freq: Three times a day (TID) | SUBCUTANEOUS | Status: DC
  Administered 2019-08-17: 2 [IU] via SUBCUTANEOUS
  Administered 2019-08-17: 3 [IU] via SUBCUTANEOUS
  Administered 2019-08-18: 2 [IU] via SUBCUTANEOUS
  Administered 2019-08-19: 5 [IU] via SUBCUTANEOUS
  Administered 2019-08-20: 3 [IU] via SUBCUTANEOUS
  Administered 2019-08-20: 5 [IU] via SUBCUTANEOUS
  Administered 2019-08-20: 3 [IU] via SUBCUTANEOUS
  Administered 2019-08-21: 5 [IU] via SUBCUTANEOUS
  Administered 2019-08-21 – 2019-08-22 (×3): 3 [IU] via SUBCUTANEOUS
  Administered 2019-08-23: 5 [IU] via SUBCUTANEOUS
  Administered 2019-08-24: 3 [IU] via SUBCUTANEOUS
  Administered 2019-08-24: 5 [IU] via SUBCUTANEOUS
  Administered 2019-08-25 – 2019-08-26 (×2): 2 [IU] via SUBCUTANEOUS
  Administered 2019-08-26: 3 [IU] via SUBCUTANEOUS

## 2019-08-16 MED ORDER — INSULIN ASPART 100 UNIT/ML ~~LOC~~ SOLN
3.0000 [IU] | Freq: Three times a day (TID) | SUBCUTANEOUS | Status: DC
  Administered 2019-08-17 – 2019-08-24 (×17): 3 [IU] via SUBCUTANEOUS

## 2019-08-16 MED ORDER — IPRATROPIUM-ALBUTEROL 0.5-2.5 (3) MG/3ML IN SOLN: 3.0000 mL | Freq: Four times a day (QID) | RESPIRATORY_TRACT | Status: DC | PRN

## 2019-08-16 MED ORDER — LINACLOTIDE 72 MCG PO CAPS
72.0000 ug | ORAL_CAPSULE | Freq: Every day | ORAL | Status: DC
  Administered 2019-08-18 – 2019-08-26 (×9): 72 ug via ORAL
  Filled 2019-08-16 (×10): qty 1

## 2019-08-16 MED ORDER — SODIUM CHLORIDE 0.9 % IV BOLUS
500.0000 mL | Freq: Once | INTRAVENOUS | Status: AC
  Administered 2019-08-16: 500 mL via INTRAVENOUS

## 2019-08-16 MED ORDER — LATANOPROST 0.005 % OP SOLN
1.0000 [drp] | Freq: Every day | OPHTHALMIC | Status: DC
  Administered 2019-08-16 – 2019-08-25 (×10): 1 [drp] via OPHTHALMIC
  Filled 2019-08-16: qty 2.5

## 2019-08-16 MED ORDER — AMLODIPINE BESYLATE 5 MG PO TABS
5.0000 mg | ORAL_TABLET | Freq: Every day | ORAL | Status: DC
  Administered 2019-08-16 – 2019-08-26 (×10): 5 mg via ORAL
  Filled 2019-08-16 (×11): qty 1

## 2019-08-16 MED ORDER — LACTATED RINGERS IV SOLN
INTRAVENOUS | Status: AC
  Administered 2019-08-16 – 2019-08-17 (×2): via INTRAVENOUS

## 2019-08-16 MED ORDER — PANTOPRAZOLE SODIUM 40 MG PO TBEC
40.0000 mg | DELAYED_RELEASE_TABLET | Freq: Every day | ORAL | Status: DC
  Administered 2019-08-16 – 2019-08-26 (×10): 40 mg via ORAL
  Filled 2019-08-16 (×11): qty 1

## 2019-08-16 MED ORDER — FENTANYL CITRATE (PF) 100 MCG/2ML IJ SOLN
25.0000 ug | Freq: Once | INTRAMUSCULAR | Status: AC
  Administered 2019-08-16: 25 ug via INTRAVENOUS
  Filled 2019-08-16: qty 2

## 2019-08-16 MED ORDER — BRINZOLAMIDE 1 % OP SUSP
1.0000 [drp] | Freq: Two times a day (BID) | OPHTHALMIC | Status: DC
  Administered 2019-08-16 – 2019-08-26 (×20): 1 [drp] via OPHTHALMIC
  Filled 2019-08-16: qty 10

## 2019-08-16 MED ORDER — ATORVASTATIN CALCIUM 40 MG PO TABS
40.0000 mg | ORAL_TABLET | Freq: Every day | ORAL | Status: DC
  Administered 2019-08-16 – 2019-08-24 (×9): 40 mg via ORAL
  Filled 2019-08-16 (×9): qty 1

## 2019-08-16 MED ORDER — ALBUTEROL SULFATE HFA 108 (90 BASE) MCG/ACT IN AERS: 2.0000 | INHALATION_SPRAY | Freq: Four times a day (QID) | RESPIRATORY_TRACT | Status: DC | PRN

## 2019-08-16 MED ORDER — FENTANYL CITRATE (PF) 100 MCG/2ML IJ SOLN: 50.0000 ug | Freq: Once | INTRAMUSCULAR | Status: DC

## 2019-08-16 MED ORDER — LINACLOTIDE 72 MCG PO CAPS: 72.0000 ug | ORAL_CAPSULE | Freq: Every day | ORAL | Status: DC

## 2019-08-16 MED ORDER — METOPROLOL TARTRATE 12.5 MG HALF TABLET
12.5000 mg | ORAL_TABLET | Freq: Two times a day (BID) | ORAL | Status: DC
  Administered 2019-08-16 – 2019-08-26 (×19): 12.5 mg via ORAL
  Filled 2019-08-16 (×21): qty 1

## 2019-08-16 MED ORDER — ACETAMINOPHEN 650 MG RE SUPP: 650.0000 mg | Freq: Four times a day (QID) | RECTAL | Status: DC | PRN

## 2019-08-16 MED ORDER — PANTOPRAZOLE SODIUM 40 MG IV SOLR
40.0000 mg | Freq: Once | INTRAVENOUS | Status: AC
  Administered 2019-08-16: 40 mg via INTRAVENOUS
  Filled 2019-08-16: qty 40

## 2019-08-16 MED ORDER — BUPROPION HCL ER (SR) 150 MG PO TB12
150.0000 mg | ORAL_TABLET | Freq: Two times a day (BID) | ORAL | Status: DC
  Administered 2019-08-16 – 2019-08-26 (×20): 150 mg via ORAL
  Filled 2019-08-16 (×20): qty 1

## 2019-08-16 MED ORDER — GABAPENTIN 300 MG PO CAPS
300.0000 mg | ORAL_CAPSULE | Freq: Three times a day (TID) | ORAL | Status: DC
  Administered 2019-08-16 – 2019-08-19 (×10): 300 mg via ORAL
  Filled 2019-08-16: qty 3
  Filled 2019-08-16 (×10): qty 1

## 2019-08-16 MED ORDER — IPRATROPIUM BROMIDE HFA 17 MCG/ACT IN AERS: 2.0000 | INHALATION_SPRAY | Freq: Four times a day (QID) | RESPIRATORY_TRACT | Status: DC | PRN

## 2019-08-16 MED ORDER — MOMETASONE FURO-FORMOTEROL FUM 100-5 MCG/ACT IN AERO
2.0000 | INHALATION_SPRAY | RESPIRATORY_TRACT | Status: DC | PRN
  Filled 2019-08-16: qty 8.8

## 2019-08-16 MED ORDER — ACETAMINOPHEN 325 MG PO TABS
650.0000 mg | ORAL_TABLET | Freq: Four times a day (QID) | ORAL | Status: DC | PRN
  Administered 2019-08-19 (×2): 650 mg via ORAL
  Filled 2019-08-16 (×3): qty 2

## 2019-08-16 MED ORDER — INSULIN ASPART 100 UNIT/ML ~~LOC~~ SOLN
0.0000 [IU] | Freq: Every day | SUBCUTANEOUS | Status: DC
  Administered 2019-08-16 – 2019-08-20 (×2): 2 [IU] via SUBCUTANEOUS
  Administered 2019-08-21: 3 [IU] via SUBCUTANEOUS
  Administered 2019-08-22: 2 [IU] via SUBCUTANEOUS

## 2019-08-16 NOTE — Plan of Care (Signed)
  Problem: Education: Goal: Ability to identify signs and symptoms of gastrointestinal bleeding will improve 08/16/2019 1832 by Arlina Robes, RN Outcome: Progressing 08/16/2019 1831 by Arlina Robes, RN Outcome: Progressing   Problem: Bowel/Gastric: Goal: Will show no signs and symptoms of gastrointestinal bleeding 08/16/2019 1832 by Arlina Robes, RN Outcome: Progressing 08/16/2019 1831 by Arlina Robes, RN Outcome: Progressing   Problem: Fluid Volume: Goal: Will show no signs and symptoms of excessive bleeding 08/16/2019 1832 by Arlina Robes, RN Outcome: Progressing 08/16/2019 [place] by Arlina Robes, RN Outcome: Progressing   Problem: Clinical Measurements: Goal: Complications related to the disease process, condition or treatment will be avoided or minimized 08/16/2019 1832 by Arlina Robes, RN Outcome: Progressing 08/16/2019 1831 by Arlina Robes, RN Outcome: Progressing   Problem: Education: Goal: Knowledge of General Education information will improve Description: Including pain rating scale, medication(s)/side effects and non-pharmacologic comfort measures Outcome: Progressing   Problem: Health Behavior/Discharge Planning: Goal: Ability to manage health-related needs will improve Outcome: Progressing   Problem: Clinical Measurements: Goal: Ability to maintain clinical measurements within normal limits will improve Outcome: Progressing Goal: Will remain free from infection Outcome: Progressing Goal: Diagnostic test results will improve Outcome: Progressing Goal: Respiratory complications will improve Outcome: Progressing Goal: Cardiovascular complication will be avoided Outcome: Progressing   Problem: Activity: Goal: Risk for activity intolerance will decrease Outcome: Progressing   Problem: Nutrition: Goal: Adequate nutrition will be maintained Outcome: Progressing   Problem: Coping: Goal: Level of anxiety will  decrease Outcome: Progressing   Problem: Elimination: Goal: Will not experience complications related to bowel motility Outcome: Progressing Goal: Will not experience complications related to urinary retention Outcome: Progressing   Problem: Pain Managment: Goal: General experience of comfort will improve Outcome: Progressing   Problem: Safety: Goal: Ability to remain free from injury will improve Outcome: Progressing   Problem: Skin Integrity: Goal: Risk for impaired skin integrity will decrease Outcome: Progressing

## 2019-08-16 NOTE — H&P (Addendum)
Date: 08/16/2019               Patient Name:  [name] [name] MRN: [id number]  DOB: 08/02/1951 Age / Sex: 68 y.o., female   PCP: [facility]         Medical Service: Internal Medicine Teaching Service         Attending Physician: Dr. Rebeca Alert, Raynaldo Opitz, MD    First Contact: Marva Panda, MD, [place] Pager: [place] [phone])  Second Contact: Shan Levans, MD, Erlene Quan Pager: BW [phone])       After Hours (After 5p/  First Contact Pager: [phone]  weekends / holidays): Second Contact Pager: [phone]   Chief Complaint: multiple falls   History of Present Illness: 68 y.o. yo female w/ PMHx significant for IDDM, CAD s/p CABG in 2013, CKD4-5, HFmEF presenting with low back pain after two falls in the bathroom on Sunday and Monday. She does not believe her falls were related to using the bathroom. She reports generalized weakness for a few days, however she did not notice any focal weakness. Patient denies any head trauma or loss of consciousness. Patient also denies any headaches, dizziness or lightheadedness prior to the falls. Patient denies any fevers/chills, chest pain over the past week, shortness of breath, nausea/vomiting, abdominal pain, noticeable blood in urine or stool or dysuria.   In the ED, patient found to have AKI and 2g drop in Hb over 2 months. FOBT+ve. CT abdomen/pelvis with mild infrarenal AAA 3.1cm. GI consulted for evaluation of GI bleed given hx of colonic angiodysplasic lesion in mid-ascending colon in 2017. Patient admitted to internal medicine teaching service for further management.    Lab Orders     SARS Coronavirus 2 [facility] order, Performed in [facility] hospital lab) Nasopharyngeal Nasopharyngeal Swab     Comprehensive metabolic panel     CBC with Differential     Urinalysis, Routine w reflex microscopic     Brain natriuretic peptide     Urine rapid drug screen (hosp performed)     Comprehensive metabolic panel     CBC     Vitamin B12      Iron and TIBC     Ferritin     TSH     Sodium, urine, random     Creatinine, urine, random     Osmolality, urine     Hemoglobin A1c     Methylmalonic acid, serum     Glucose, capillary     Glucose, capillary     Glucose, capillary     Glucose, capillary     POC occult blood, ED Provider will collect   Meds:  Current Meds  Medication Sig  . allopurinol (ZYLOPRIM) 100 MG tablet Take 100 mg by mouth 2 (two) times daily.   Marland Kitchen amLODipine (NORVASC) 5 MG tablet Take 1 tablet (5 mg total) by mouth daily.  Marland Kitchen aspirin EC 325 MG tablet Take 325 mg by mouth daily.  Marland Kitchen atorvastatin (LIPITOR) 40 MG tablet Take 1 tablet (40 mg total) by mouth daily at 6 PM.  . brinzolamide (AZOPT) 1 % ophthalmic suspension Place 1 drop into both eyes 2 (two) times a day.   Marland Kitchen buPROPion (WELLBUTRIN SR) 150 MG 12 hr tablet Take 1 tablet (150 mg total) by mouth 2 (two) times daily.  . cetirizine (ZYRTEC) 10 MG tablet Take 10 mg by mouth daily.   . ferrous sulfate 325 (65 FE) MG tablet Take 325 mg by mouth 3 (three)  times daily with meals.  . furosemide (LASIX) 40 MG tablet Take 1-2 tablets (40-80 mg total) by mouth See admin instructions. Take 2 tablets in the morning and 1 tablet at bedtime (Patient taking differently: Take 40-80 mg by mouth See admin instructions. Take 80 mg in the morning and 40 mg at bedtime)  . gabapentin (NEURONTIN) 300 MG capsule Take 1 capsule (300 mg total) by mouth 3 (three) times daily.  Marland Kitchen glimepiride (AMARYL) 4 MG tablet Take 4 mg by mouth daily.  . Insulin Glargine (LANTUS SOLOSTAR) 100 UNIT/ML Solostar Pen Inject 21 Units into the skin daily at 10 pm.  . ipratropium (ATROVENT HFA) 17 MCG/ACT inhaler Inhale 2 puffs into the lungs every 6 (six) hours as needed for wheezing.  . latanoprost (XALATAN) 0.005 % ophthalmic solution Place 1 drop into both eyes at bedtime.  Marland Kitchen LINZESS 72 MCG capsule Take 72 mcg by mouth daily before breakfast.   . metoprolol tartrate (LOPRESSOR) 25 MG tablet Take  25 mg by mouth 2 (two) times daily.  . mometasone-formoterol (DULERA) 100-5 MCG/ACT AERO Inhale 2 puffs into the lungs every 4 (four) hours as needed for wheezing.  . nitroGLYCERIN (NITROSTAT) 0.4 MG SL tablet Place 1 tablet (0.4 mg total) under the tongue every 5 (five) minutes as needed for chest pain.  . polyethylene glycol (MIRALAX / GLYCOLAX) 17 g packet Take 17 g by mouth daily as needed for mild constipation.   Marland Kitchen PROAIR HFA 108 (90 Base) MCG/ACT inhaler Inhale 2 puffs into the lungs every 6 (six) hours as needed for wheezing.   Grant Ruts INHUB 250-50 MCG/DOSE AEPB Inhale 2 puffs into the lungs daily.      Allergies: Allergies as of 08/16/2019 - Review Complete 08/16/2019  Allergen Reaction Noted  . Naproxen Rash 09/21/2013   Past Medical History:  Diagnosis Date  . Acute respiratory failure with hypoxia and hypercapnia ([place]) 06/01/2019  . Anemia 08/2016  . Angiodysplasia of colon   . Arthritis of left shoulder region 03/23/2013  . Cardiomegaly 05/2019  . Chronic combined systolic and diastolic CHF (congestive heart failure) ([facility])    a. EF 40-45%, mild LVH, mid apicalanteroseptal and apical HK.  . CKD (chronic kidney disease), stage III ([place])   . Cocaine abuse ([place])    crack cocaine heavily until 2008 then sporadic use since then  . Coronary artery disease    a. 06/2012 NSTEMI/CABG x 3 (LIMA->LAD, VG->OM2, VG->LCX);  b. 04/2015 MV: EF<30%, mid ant, apicalanterior, apical infarct;  c. 04/2015 Cath: LM nl, LAD 90p, LCX 40m, OM1 min irregs, RCA mild dzs, LIMA->LAD nl w/ dist LAD dzs, VG->OM2 nl, VG->LCX nl-->Med Rx.  . CVA (cerebral infarction)    a. right internal capsule stroke in 12/2006  . Diabetes mellitus    diagnosed in 2008  . Essential hypertension   . Glaucoma   . Gout   . HFrEF (heart failure with reduced ejection fraction) ([place])   . Hyperlipidemia   . Hyperparathyroidism, secondary renal ([place])   . Left-sided sensory deficit present   . Obesity, morbid ([place])   .  Pneumonia   . Pulmonary edema 05/2019  . PVD (peripheral vascular disease) ([place])    a. 06/2012 ABI's: R - 0.73, L - 0.71.  Marland Kitchen Renal mass, right   . Shortness of breath dyspnea   . Stroke ([place])   . Thyroid nodule    FNA in 4696 showed follicular cells but not definate neoplasm  . Tobacco abuse   . Trichomoniasis  Family History:  Family History  Problem Relation Age of Onset  . Diabetes Mother   . Hypertension Mother   . Cancer Mother   . Hyperlipidemia Father   . Hypertension Father   . Kidney disease Father   . Gout Father   . Cerebrovascular Accident Father   . Other Other        no known family CAD     Social History:  Social History   Tobacco Use  . Smoking status: Current Some Day Smoker    Packs/day: 0.25    Years: 50.00    Pack years: 12.50    Types: Cigarettes  . Smokeless tobacco: Never Used  Substance Use Topics  . Alcohol use: No    Alcohol/week: 0.0 standard drinks  . Drug use: Yes    Types: Cocaine    Comment: pt denies at current time     Review of Systems: Review of Systems  Constitutional: Negative for chills and fever.  Respiratory: Negative for cough, shortness of breath and wheezing.   Cardiovascular: Negative for chest pain and palpitations.  Gastrointestinal: Negative for abdominal pain, blood in stool, constipation, diarrhea, melena, nausea and vomiting.  Genitourinary: Negative for dysuria, flank pain, frequency and hematuria.  Musculoskeletal: Positive for back pain, falls and joint pain. Negative for myalgias.  Neurological: Positive for weakness. Negative for dizziness, focal weakness and headaches.     Physical Exam: Blood pressure (!) 148/71, pulse 68, temperature (!) 97.5 F (36.4 C), temperature source Oral, resp. rate 16, height 5\' 8"  (1.727 m), weight 124.5 kg, SpO2 97 %.  Physical Exam  Constitutional: She is oriented to person, place, and time and well-developed, well-nourished, and in no distress.  HENT:  Head:  Normocephalic and atraumatic.  Mouth/Throat: Oropharynx is clear and moist.  Eyes: EOM are normal.  Neck: Normal range of motion. Neck supple. No JVD present.  Cardiovascular: Normal rate, regular rhythm, normal heart sounds and intact distal pulses. Exam reveals no gallop and no friction rub.  No murmur heard. Pulmonary/Chest: Effort normal and breath sounds normal. No respiratory distress. She has no wheezes. She has no rales. She exhibits no tenderness.  Abdominal: Soft. Bowel sounds are normal. She exhibits no distension and no mass. There is no guarding.  Musculoskeletal: Normal range of motion.        General: No tenderness, deformity or edema.  Neurological: She is alert and oriented to person, place, and time. No cranial nerve deficit. Coordination normal.  Skin: Skin is warm and dry.  Psychiatric: Mood, memory, affect and judgment normal.     EKG: personally reviewed my interpretation is premature ventricular contractions with possible left ventricular hypertrophy. Rate ~65bpm, QT/QTc 487/511  CXR 08/16/2019: IMPRESSION: Cardiomegaly, vascular congestion.  X-RAY LUMBAR SPINE 08/16/2019: IMPRESSION: Mild multilevel degenerative disc disease. No acute abnormality seen in the lumbar spine. Possible abdominal aortic aneurysm is noted. Ultrasound is recommended for further evaluation.  X-RAY BILATERAL HIPS 08/16/2019: FINDINGS: There is no evidence of hip fracture or dislocation. There is no evidence of arthropathy or other focal bone abnormality.  X-RAY LEFT KNEE 08/16/2019: FINDINGS: No evidence of fracture, dislocation, or joint effusion. No evidenceof arthropathy or other focal bone abnormality. Soft tissues are unremarkable.  CT L-SPINE 08/16/2019:  IMPRESSION: 1. No acute findings within the abdomen or pelvis. 2. No fracture or dislocation within the lumbar spine, sacrum or osseous pelvis. 3. Aortic atherosclerosis. Mild aneurysm of the infrarenal abdominal aorta,  measuring 3.1 cm diameter. Recommend followup by ultrasound in  3 years. This recommendation follows ACR consensus guidelines: White Paper of the ACR Incidental Findings Committee II on Vascular Findings. J Am Coll Radiol 2013; 10:789-794. Aortic aneurysm NOS (ICD10-I71.9) 4. Small hiatal hernia. 5. Degenerative spondylosis of the lumbar spine, mild to moderate in degree with associated neural foramen encroachments at the L5-S1 level. If symptoms could be radiculopathic in nature, would consider nonemergent lumbar spine MRI to exclude associated nerve root impingement.   Assessment & Plan by Problem: Active Problems:   GI bleed  Mechanical falls w/o syncope: Patient reports two episodes of mechanical falls where she tripped on the bathroom rug on Sunday and again on Monday. Patient denies any dizziness or lightheadedness prior to these episodes. Patient reports a few episodes of lightheadedness immediately upon standing, requiring her to pause before proceeding to walk. She also denies any head trauma or loss of consciousness. She reports landing on her knee and back. No signs of musculoskeletal trauma on imaging studies.   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 Of note, patient has had recent Hb drop of 2g/dl over 2 months period (9.4 >7.3) presenting after two mechanical falls. She reports dark brown stools and was found to be FOBT+ve. Per chart review, patient was supposed to have a colonoscopy in June 2020 but due to transportation issues, unable to do so.   - Protonix 40mg  qd - NPO after midnight for possible endoscopy in the morning with Dr. Henrene Pastor  AKI on CKD 4/5 vs Progression of CKD BUN/Cr 132/6.1 on admission. Baseline cr 3.3-3.4. Patient reports good appetite and PO intake. She reports that her Lasix dose was recently increased by Dr. Marylou Flesher. However she has not been treated with  EPO yet. Patient has been given Feraheme in 2017 but has not received any since then.   - Renal function panel - Strict I/O and daily weights - F/u CBC, B12 and folate studies  - F/u urine studies    Insulin dependent diabetes mellitus: Patient on Lantus 21U + novolog 3U tid at home. Last HbA1c 6.6 on 03/2019.   - Novolog 3U tid + SSI  - CBG monitoring  Hypertension, HFmrEF: EF on Echo 40-45% on TTE in April 2020.  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 Recommend followup by ultrasound in 3 years. - Continue to monitor  Hypercholesterolemia: Patient on atorvastatin 40mg  qd - Continue home meds  Hx of depression:  Wellbutrin 150mg  bid - Continue home meds  Hx of gout: Allopurinol 100mg  qd - Continue home meds  Diet: NPO  Fluids: LR 125 ml/hr Code: Full code DVT Prophylaxis: SCDs  Dispo: Admit patient to Inpatient with expected length of stay greater than 2 midnights.  Signed: Harvie Heck, MD  Internal Medicine, PGY-1 08/16/2019, 7:33 PM  Pager: [phone]

## 2019-08-16 NOTE — Progress Notes (Signed)
Oncoming shift informed of need to collect pending lab specimens.

## 2019-08-16 NOTE — Discharge Instructions (Addendum)
[name] [name], [name] [name] were admitted to the hospital after a fall and found to have acute anemia that was suspected from a GI bleed and a kidney injury.   You received 3 units of blood during your stay and your hemoglobin levels responded appropriately. Endoscopy did not show an upper GI bleed. Please schedule a follow up appointment with your GI doctor to schedule a colonoscopy at your earliest convenience.   For your kidneys, we started you on hemodialysis during your hospital stay. You underwent 3 hemodialysis sessions during your stay, had a tunneled catheter and AV fistula placed. You are scheduled for regular outpatient hemodialysis at the [facility] on Tuesday, Thursday, and Saturday schedule at 7am. Your first session will be on Saturday 08/27/2019 at 7am and you will need to arrive at 6:40am to your appointment.  On Friday 08/26/2019, you will need to go to the [facility] after you leave the hospital to complete paperwork necessary to start your dialysis session on Saturday.   Please schedule a visit with your PCP in one week for a hospital follow up visit.    Your CT scan showed a mild aneurysm of the infrarenal abdominal  aorta, measuring 3.1 cm diameter.  The radiologist recommend followup by ultrasound in 3 years for monitoring.  Please let your primary care provider know about this next time you see them.   FOR YOUR AV FISTULA CARE:  You may shower after you go home. Keep your incision dry for 48 hours. Do not soak in a bathtub, hot tub, or swim until the incision heals completely. You may not shower if you have a hemodialysis catheter.  Incision Care  Clean your incision with mild soap and water after 48 hours. Pat the area dry with a clean towel. You do not need a bandage unless otherwise instructed. Do not apply any ointments or creams to your incision. You may have skin glue on your incision. Do not peel it off. It will come off on its own in about one week.  Your arm may swell a bit after surgery. To reduce swelling use pillows to elevate your arm so it is above your heart. Your doctor will tell you if you need to lightly wrap your arm with an ACE bandage.   Medications  Resume taking all of your medications. If your incision is causing pain, you may take over-the counter pain relievers such as acetaminophen (Tylenol). If you were prescribed a stronger pain medication, please be aware these medications can cause nausea and constipation. Prevent nausea by taking the medication with a snack or meal. Avoid constipation by drinking plenty of fluids and eating foods with high amount of fiber, such as fruits, vegetables, and grains. Do not take Tylenol if you are taking prescription pain medications.    Please call us immediately for any of the following conditions:  Increased pain, redness, drainage (pus) from your incision site Fever of 101 degrees or higher Severe or worsening pain at your incision site Hand pain or numbness.  Dialysis It will take several weeks to several months for your new dialysis access to be ready for use. Your surgeon will determine when it is OK to use it. Your nephrologist will continue to direct your dialysis. You can continue to use your Permcath until your new access is ready for use.   If you have any questions, please call the vascular surgery office at [phone].

## 2019-08-16 NOTE — Consult Note (Addendum)
[facility] Consult: 2:16 PM 08/16/2019  LOS: 0 days    Referring Provider: Eliezer Mccoy, PA-C in the ED. Primary Care Physician:  [place] [facility]astroenterologist:  Dr. Silvano Rusk    Reason for Consultation: Anemia, FOBT positive.   HPI: [name] [name] is a 68 y.o. female.  Multiple medical problems outlined below but significant for IDDM.  Stage 4-5 CKD.  CVA.  CAD.  CABG 2013.  CHF.  Cocaine abuse  03/2016 EGD: For evaluation of IDA, FOBT positive.  Medium sized hiatal hernia, no Cameron's lesions.  Otherwise normal study. 03/2016 colonoscopy: solitary, nonbleeding colonic AVM at mid ascending colon, not treated.  2-day admission in early June with acute, hypercapneiic, hypoxic respiratory failure in setting of cocaine use.  Elevated troponin and transient Mobitz type II, second-degree AV block managed conservatively.  Last GI interaction was telehealth visit, anemia referral, on 05/27/2019. Dr. Carlean Purl suspected she was bleeding from AVM(s) in the colon.   Set up for colonoscopy 6/23 but canceled when she was not able to purchase the  bowel prep.  She went as far as having COVID-19 testing on 6/19 (negative then as well as in late April and early June).  Repeat colonoscopy set for 7/16 canceled when pt unable to find transportation for pre-procedure COVID testing. GI staff has been having problems communicating with pt through either of her phone numbers, she does not have a voicemail.  Staff reached out to her aunt Earlie Server and attempt to get the patient to call GI.    Presented to the ED for evaluation of pain in her left leg and sacrum.  She has recently fallen twice after tripping on rugs.  Denies syncope.  A few days of progressive weakness.  Dyspnea is not mentioned in  notes, patient denies SOB.  Chest pain resolved with SL nitroglycerin. Labs reveal HGB 7.3, MCV 81. Hgb ranging 10.5-11.6 in 05/2019, 8-9 in April and May. Normal platelets On DRE stool is dark but not melenic.  Tests FOBT +.   Also significant is ongoing, progressive CKD.  GFR has gone from 14 to 8 BNP is elevated to 250. CTAP without contrast:   No acute trauma, fractures.  Aortic atherosclerosis, 3.1cm infrarenal AAA.  Small HH.  Degenerative lumbar spine spondylosis. CXR with vascular congestion and centimeters X-rays of lumbar spine and knee are negative for acute fractures  Reviewed and note that she did receive Feraheme twice in 2017 but no documented repeat Feraheme therapy after that. She is followed by nephrology, Dr. Pearson Grippe.  Last seen in July at which time Lasix dose was increased.  She has not been set up for vascular/dialysis access procedures nor has she ever been treated with CSA (colony-stimulating agents, such as Epogen).   Takes 3 iron tablets daily, Ibuprofen 200mg  daily, ASA 325mg , daily Linzess.  No PPI or H2 blocker Appetite excellent, no significant weight fluctuation.  No new swelling of the limbs.  No abdominal pain, no nausea, vomiting. Stools are chronically dark due to the oral iron.  Today stool is on par with her norm.  Last smoked cocaine about 2 weeks ago.  Smokes cigarettes.  Does not use alcoholic beverages.  Lives with her significant other partner for the last 25 years.  Past Medical History:  Diagnosis Date   Acute respiratory failure with hypoxia and hypercapnia (HCC) 06/01/2019   Anemia 08/2016   Angiodysplasia of colon    Arthritis of left shoulder region 03/23/2013   Cardiomegaly 05/2019   Chronic combined systolic and diastolic CHF (congestive heart failure) (HCC)    a. EF 40-45%, mild LVH, mid apicalanteroseptal and apical HK.   CKD (chronic kidney disease), stage III ([place])    Cocaine abuse ([place])    crack cocaine heavily until  2008 then sporadic use since then   Coronary artery disease    a. 06/2012 NSTEMI/CABG x 3 (LIMA->LAD, VG->OM2, VG->LCX);  b. 04/2015 MV: EF<30%, mid ant, apicalanterior, apical infarct;  c. 04/2015 Cath: LM nl, LAD 90p, LCX 71m, OM1 min irregs, RCA mild dzs, LIMA->LAD nl w/ dist LAD dzs, VG->OM2 nl, VG->LCX nl-->Med Rx.   CVA (cerebral infarction)    a. right internal capsule stroke in 12/2006   Diabetes mellitus    diagnosed in 2008   Essential hypertension    Glaucoma    Gout    HFrEF (heart failure with reduced ejection fraction) (HCC)    Hyperlipidemia    Hyperparathyroidism, secondary renal (HCC)    Left-sided sensory deficit present    Obesity, morbid ([place])    Pneumonia    Pulmonary edema 05/2019   PVD (peripheral vascular disease) ([place])    a. 06/2012 ABI's: R - 0.73, L - 0.71.   Renal mass, right    Shortness of breath dyspnea    Stroke [facility])    Thyroid nodule    FNA in 2025 showed follicular cells but not definate neoplasm   Tobacco abuse    Trichomoniasis     Past Surgical History:  Procedure Laterality Date   CARDIAC CATHETERIZATION     CARDIAC CATHETERIZATION N/A 05/17/2015   Procedure: Left Heart Cath and Cors/Grafts Angiography;  Surgeon: [name], MD;  Location: [facility];  Service: Cardiovascular;  Laterality: N/A;   COLONOSCOPY WITH PROPOFOL N/A 04/21/2016   Procedure: COLONOSCOPY WITH PROPOFOL;  Surgeon: Irene Shipper, MD;  Location: [facility];  Service: Endoscopy;  Laterality: N/A;   CORONARY ARTERY BYPASS GRAFT  07/09/2012   Procedure: CORONARY ARTERY BYPASS GRAFTING (CABG);  Surgeon: Ivin Poot, MD;  Location: [facility];  Service: Open Heart Surgery;  Laterality: N/A;   ESOPHAGOGASTRODUODENOSCOPY N/A 04/20/2016   Procedure: ESOPHAGOGASTRODUODENOSCOPY (EGD);  Surgeon: Gatha Mayer, MD;  Location: [facility] ENDOSCOPY;  Service: Endoscopy;  Laterality: N/A;   LEFT HEART CATHETERIZATION WITH CORONARY ANGIOGRAM N/A 06/29/2012    Procedure: LEFT HEART CATHETERIZATION WITH CORONARY ANGIOGRAM;  Surgeon: Peter M Martinique, MD;  Location: [facility] CATH LAB;  Service: Cardiovascular;  Laterality: N/A;   STERNAL WOUND DEBRIDEMENT  08/17/2012   Procedure: STERNAL WOUND DEBRIDEMENT;  Surgeon: Ivin Poot, MD;  Location: [facility];  Service: Thoracic;  Laterality: N/A;  wound vac application   STERNAL WOUND DEBRIDEMENT  08/24/2012   Procedure: STERNAL WOUND DEBRIDEMENT;  Surgeon: Ivin Poot, MD;  Location: [facility];  Service: Thoracic;  Laterality: N/A;   STERNAL WOUND DEBRIDEMENT  09/01/2012   Procedure: STERNAL WOUND DEBRIDEMENT;  Surgeon: Ivin Poot, MD;  Location: [facility];  Service: Thoracic;  Laterality: N/A;   [place]  09/20/2012   Procedure: STERNAL WOUND DEBRIDEMENT;  Surgeon: Ivin Poot, MD;  Location[facility];  Service: Thoracic;  Laterality: N/A;  wound vac change    Prior to Admission medications   Medication Sig Start Date End Date Taking? Authorizing Provider  allopurinol (ZYLOPRIM) 100 MG tablet Take 100 mg by mouth 2 (two) times daily.  11/25/17  Yes [provider]  amLODipine (NORVASC) 5 MG tablet Take 1 tablet (5 mg total) by mouth daily. 04/30/19 04/29/20 Yes [name], MD  aspirin EC 325 MG tablet Take 325 mg by mouth daily. 05/13/19  Yes [provider]  atorvastatin (LIPITOR) 40 MG tablet Take 1 tablet (40 mg total) by mouth daily at 6 PM. 06/03/19  Yes Hongalgi, Lenis Dickinson, MD  brinzolamide (AZOPT) 1 % ophthalmic suspension Place 1 drop into both eyes 2 (two) times a day.    Yes [provider]  buPROPion (WELLBUTRIN SR) 150 MG 12 hr tablet Take 1 tablet (150 mg total) by mouth 2 (two) times daily. 01/16/16  Yes Mikhail, Velta Addison, DO  cetirizine (ZYRTEC) 10 MG tablet Take 10 mg by mouth daily.    Yes [provider]  ferrous sulfate 325 (65 FE) MG tablet Take 325 mg by mouth 3 (three) times daily with meals.   Yes [provider]  furosemide (LASIX) 40 MG  tablet Take 1-2 tablets (40-80 mg total) by mouth See admin instructions. Take 2 tablets in the morning and 1 tablet at bedtime Patient taking differently: Take 40-80 mg by mouth See admin instructions. Take 80 mg in the morning and 40 mg at bedtime 06/03/19  Yes Hongalgi, Lenis Dickinson, MD  gabapentin (NEURONTIN) 300 MG capsule Take 1 capsule (300 mg total) by mouth 3 (three) times daily. 06/03/19  Yes Hongalgi, Lenis Dickinson, MD  glimepiride (AMARYL) 4 MG tablet Take 4 mg by mouth daily. 05/04/19  Yes [provider]  Insulin Glargine (LANTUS SOLOSTAR) 100 UNIT/ML Solostar Pen Inject 21 Units into the skin daily at 10 pm. 06/03/19  Yes Hongalgi, Lenis Dickinson, MD  ipratropium (ATROVENT HFA) 17 MCG/ACT inhaler Inhale 2 puffs into the lungs every 6 (six) hours as needed for wheezing.   Yes [provider]  latanoprost (XALATAN) 0.005 % ophthalmic solution Place 1 drop into both eyes at bedtime.   Yes [provider]  LINZESS 72 MCG capsule Take 72 mcg by mouth daily before breakfast.  11/21/17  Yes [provider]  metoprolol tartrate (LOPRESSOR) 25 MG tablet Take 25 mg by mouth 2 (two) times daily.   Yes [provider]  mometasone-formoterol (DULERA) 100-5 MCG/ACT AERO Inhale 2 puffs into the lungs every 4 (four) hours as needed for wheezing.   Yes [provider]  nitroGLYCERIN (NITROSTAT) 0.4 MG SL tablet Place 1 tablet (0.4 mg total) under the tongue every 5 (five) minutes as needed for chest pain. 04/30/19  Yes [name], MD  polyethylene glycol (MIRALAX / GLYCOLAX) 17 g packet Take 17 g by mouth daily as needed for mild constipation.    Yes [provider]  PROAIR HFA 108 (90 Base) MCG/ACT inhaler Inhale 2 puffs into the lungs every 6 (six) hours as needed for wheezing.  11/17/17  Yes [provider]  Grant Ruts INHUB 250-50 MCG/DOSE AEPB Inhale 2 puffs into the lungs daily.  05/04/19  Yes [provider]  pantoprazole (PROTONIX) 40 MG tablet Take 1  tablet (40 mg total) by mouth daily. Patient not taking: Reported on 06/15/2019 09/23/16  Thurnell Lose, MD    Scheduled Meds:  Infusions:  PRN Meds:    Allergies as of 08/16/2019 - Review Complete 08/16/2019  Allergen Reaction Noted   Naproxen Rash 09/21/2013    Family History  Problem Relation Age of Onset   Diabetes Mother    Hypertension Mother    Cancer Mother    Hyperlipidemia Father    Hypertension Father    Kidney disease Father    Gout Father    Cerebrovascular Accident Father    Other Other        no known family CAD    Social History   Socioeconomic History   Marital status: Single    Spouse name: Not on file   Number of children: 0   Years of education: Not on file   Highest education level: Not on file  Occupational History   Not on file  Social Needs   Financial resource strain: Not on file   Food insecurity    Worry: Not on file    Inability: Not on file   Transportation needs    Medical: Not on file    Non-medical: Not on file  Tobacco Use   Smoking status: Current Some Day Smoker    Packs/day: 0.25    Years: 50.00    Pack years: 12.50    Types: Cigarettes   Smokeless tobacco: Never Used  Substance and Sexual Activity   Alcohol use: No    Alcohol/week: 0.0 standard drinks   Drug use: Yes    Types: Cocaine    Comment: pt denies at current time   Sexual activity: Not Currently  Lifestyle   Physical activity    Days per week: Not on file    Minutes per session: Not on file   Stress: Not on file  Relationships   Social connections    Talks on phone: Not on file    Gets together: Not on file    Attends religious service: Not on file    Active member of club or organization: Not on file    Attends meetings of clubs or organizations: Not on file    Relationship status: Not on file   Intimate partner violence    Fear of current or ex partner: Not on file    Emotionally abused: Not on file     Physically abused: Not on file    Forced sexual activity: Not on file  Other Topics Concern   Not on file  Social History Narrative   Lives with boyfriend of 20 years.     REVIEW OF SYSTEMS: Constitutional: Per HPI.  Fatigue, weakness in recent days. ENT:  No nose bleeds Pulm: Denies shortness of breath, dyspnea, cough. CV:  No palpitations, no new LE edema.  Fleeting, nonexertional chest pain, resolved with nitroglycerin SL. GU:  No hematuria, no frequency.  Good urine output of yellow urine. GI: See HPI. Heme: No unusual bleeding or bruising. Transfusions: Received PRBCs on 2 occasions in 2017 Neuro:  No headaches, no peripheral tingling or numbness Derm:  No itching.  Chronic rash at the top of her thighs and groin. Endocrine:  No sweats or chills.  No polyuria or dysuria Immunization: Reviewed. Travel:  None beyond local counties in last few months.    PHYSICAL EXAM: Vital signs in last 24 hours: Vitals:   08/16/19 1030 08/16/19 1115  BP: (!) 142/90 138/65  Pulse: (!) 54 65  Resp: 19 17  Temp:    SpO2: 98%  99%   Wt Readings from Last 3 Encounters:  06/03/19 124 kg  05/27/19 128.4 kg  04/30/19 134.2 kg    General: Obese, pleasant, comfortable, non-ill-appearing AAF Head: No facial asymmetry or swelling.  No signs of head trauma. Eyes: No scleral icterus.  No conjunctival pallor.  EOMI. Ears: Not hard of hearing Nose: No discharge or congestion Mouth: Oral mucosa pink, moist, clear.  Tongue midline. Neck: No JVD, no masses, no thyromegaly. Lungs: Clear bilaterally though somewhat diminished globally.  No labored breathing.  No cough.  Vocal quality rough. Heart: RRR.  No MRG.  S1, S2 present.  Overall heart sounds distant. Abdomen: Soft.  Not tender or distended no HSM, masses, bruits, hernias.  Intertriginous rash at the top of the thighs/groins bil..   Rectal: Not repeated.  Stool was dark, FOBT positive but not melenic, no rectal masses per DRE by ED physician  assistant Musc/Skeltl: No joint redness, swelling or gross deformity. Extremities: Obese limbs but no pitting edema. Neurologic: No asterixis, tremors.  Moves all 4 limbs, strength not tested.  Alert and oriented x3.  Good historian. Skin: Intertriginous rash as per abdominal exam.  Looked at both sides of limbs as well as torso and do not see significant bruising or signs of trauma anywhere Tattoos: None observed. Nodes: No cervical adenopathy. Psych: Calm, pleasant, cooperative.  Intake/Output from previous day: No intake/output data recorded. Intake/Output this shift: Total I/O In: 500 [IV Piggyback:500] Out: -   LAB RESULTS: Recent Labs    08/16/19 0934  WBC 10.9*  HGB 7.3*  HCT 25.8*  PLT 458*   BMET Lab Results  Component Value Date   NA 138 08/16/2019   NA 140 06/03/2019   NA 142 06/02/2019   K 4.3 08/16/2019   K 4.9 06/03/2019   K 4.7 06/02/2019   CL 105 08/16/2019   CL 110 06/03/2019   CL 111 06/02/2019   CO2 20 (L) 08/16/2019   CO2 19 (L) 06/03/2019   CO2 20 (L) 06/02/2019   GLUCOSE 142 (H) 08/16/2019   GLUCOSE 145 (H) 06/03/2019   GLUCOSE 257 (H) 06/02/2019   BUN 132 (H) 08/16/2019   BUN 52 (H) 06/03/2019   BUN 41 (H) 06/02/2019   CREATININE 6.11 (H) 08/16/2019   CREATININE 3.57 (H) 06/03/2019   CREATININE 3.34 (H) 06/02/2019   CALCIUM 8.6 (L) 08/16/2019   CALCIUM 8.9 06/03/2019   CALCIUM 9.3 06/02/2019   LFT Recent Labs    08/16/19 0934  PROT 6.8  ALBUMIN 3.2*  AST 12*  ALT 11  ALKPHOS 109  BILITOT 0.4   PT/INR Lab Results  Component Value Date   INR 1.2 04/28/2019   INR 1.17 09/23/2016   INR 1.12 04/17/2016   Hepatitis Panel No results for input(s): HEPBSAG, HCVAB, HEPAIGM, HEPBIGM in the last 72 hours. C-Diff No components found for: CDIFF Lipase     Component Value Date/Time   LIPASE 31 04/18/2016 1124    Drugs of Abuse     Component Value Date/Time   LABOPIA NONE DETECTED 04/28/2019 0720   COCAINSCRNUR POSITIVE (A)  04/28/2019 0720   LABBENZ NONE DETECTED 04/28/2019 0720   AMPHETMU NONE DETECTED 04/28/2019 0720   THCU NONE DETECTED 04/28/2019 0720   LABBARB NONE DETECTED 04/28/2019 0720     RADIOLOGY STUDIES: Ct Abdomen Pelvis Wo Contrast  Result Date: 08/16/2019 CLINICAL DATA:  Fall, leg and sacral pain. EXAM: CT ABDOMEN AND PELVIS WITHOUT CONTRAST CT LUMBAR SPINE WITHOUT CONTRAST TECHNIQUE: Multidetector CT imaging  of the abdomen and pelvis was performed following the standard protocol without IV contrast. COMPARISON:  CT abdomen dated 09/18/2016. FINDINGS: Lower chest: No acute findings. Small hiatal hernia. Hepatobiliary: No focal liver abnormality is seen. No gallstones, gallbladder wall thickening, or biliary dilatation. Pancreas: Unremarkable. No pancreatic ductal dilatation or surrounding inflammatory changes. Spleen: Normal in size without focal abnormality. Adrenals/Urinary Tract: Adrenal glands are unremarkable. Kidneys are unremarkable without mass, stone or hydronephrosis. No evidence of renal injury. No perinephric fluid. Stomach/Bowel: No dilated large or small bowel loops. No evidence of bowel wall inflammation or bowel injury.w stomach is unremarkable, partially decompressed. Vascular/Lymphatic: Aortic atherosclerosis. 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 No enlarged lymph nodes seen in the abdomen or pelvis. Reproductive: Uterus and bilateral adnexa are unremarkable. Other: No free fluid or hemorrhage within the abdomen or pelvis. No free intraperitoneal air. Musculoskeletal: No osseous fracture or dislocation seen. Specifically, fracture within the osseous pelvis or lower lumbar spine. Degenerative spondylosis of the lower lumbar spine, moderate in degree with suspected associated neural foramen encroachments at the L5-S1 level. LUMBAR SPINE CT FINDINGS: Alignment of the lumbar spine is normal. No fracture line or displaced fracture fragment. No compression  fracture deformity. Facet joints are normally aligned. Upper sacrum is intact and normally aligned. Degenerative spondylosis throughout the lumbar spine, mild to moderate in degree with associated disc space narrowings, vacuum disc phenomenon and degenerative facet hypertrophy. Associated osseous neural foramen narrowings at L5-S1 with possible associated nerve root impingement. No acute findings within the immediate paravertebral soft tissues. IMPRESSION: 1. No acute findings within the abdomen or pelvis. 2. No fracture or dislocation within the lumbar spine, sacrum or osseous pelvis. 3. Aortic atherosclerosis. 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 Recommend followup by ultrasound in 3 years. This recommendation follows ACR consensus guidelines: White Paper of the ACR Incidental Findings Committee II on Vascular Findings. J Am Coll Radiol 2013; 10:789-794. Aortic aneurysm NOS (ICD10-I71.9) 4. Small hiatal hernia. 5. Degenerative spondylosis of the lumbar spine, mild to moderate in degree with associated neural foramen encroachments at the L5-S1 level. If symptoms could be radiculopathic in nature, would consider nonemergent lumbar spine MRI to exclude associated nerve root impingement. Aortic Atherosclerosis (ICD10-I70.0). Electronically Signed   By: Franki Cabot M.D.   On: 08/16/2019 13:21   Dg Chest 2 View  Result Date: 08/16/2019 CLINICAL DATA:  Shortness of breath, chest pain EXAM: CHEST - 2 VIEW COMPARISON:  06/01/2019 FINDINGS: Cardiomegaly with vascular congestion. Prior CABG. No overt edema. No confluent opacities or effusions. No acute bony abnormality. IMPRESSION: Cardiomegaly, vascular congestion. Electronically Signed   By: Rolm Baptise M.D.   On: 08/16/2019 09:59   Dg Lumbar Spine Complete  Result Date: 08/16/2019 CLINICAL DATA:  Lower back and bilateral hip pain after fall. EXAM: LUMBAR SPINE - COMPLETE 4+ VIEW COMPARISON:  None. FINDINGS: No fracture or  spondylolisthesis is noted. Mild degenerative disc disease is noted at L2-3 and L5-S1. Atherosclerosis of abdominal aorta is noted. Abdominal aortic aneurysm cannot be excluded. Posterior facet joints are unremarkable. IMPRESSION: Mild multilevel degenerative disc disease. No acute abnormality seen in the lumbar spine. Possible abdominal aortic aneurysm is noted. Ultrasound is recommended for further evaluation. Aortic Atherosclerosis (ICD10-I70.0). Electronically Signed   By: Marijo Conception M.D.   On: 08/16/2019 09:14   Ct L-spine No Charge  Result Date: 08/16/2019 CLINICAL DATA:  Fall, leg and sacral pain. EXAM: CT ABDOMEN AND PELVIS WITHOUT CONTRAST CT  LUMBAR SPINE WITHOUT CONTRAST TECHNIQUE: Multidetector CT imaging of the abdomen and pelvis was performed following the standard protocol without IV contrast. COMPARISON:  CT abdomen dated 09/18/2016. FINDINGS: Lower chest: No acute findings. Small hiatal hernia. Hepatobiliary: No focal liver abnormality is seen. No gallstones, gallbladder wall thickening, or biliary dilatation. Pancreas: Unremarkable. No pancreatic ductal dilatation or surrounding inflammatory changes. Spleen: Normal in size without focal abnormality. Adrenals/Urinary Tract: Adrenal glands are unremarkable. Kidneys are unremarkable without mass, stone or hydronephrosis. No evidence of renal injury. No perinephric fluid. Stomach/Bowel: No dilated large or small bowel loops. No evidence of bowel wall inflammation or bowel injury.w stomach is unremarkable, partially decompressed. Vascular/Lymphatic: Aortic atherosclerosis. 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 No enlarged lymph nodes seen in the abdomen or pelvis. Reproductive: Uterus and bilateral adnexa are unremarkable. Other: No free fluid or hemorrhage within the abdomen or pelvis. No free intraperitoneal air. Musculoskeletal: No osseous fracture or dislocation seen. Specifically, fracture within the  osseous pelvis or lower lumbar spine. Degenerative spondylosis of the lower lumbar spine, moderate in degree with suspected associated neural foramen encroachments at the L5-S1 level. LUMBAR SPINE CT FINDINGS: Alignment of the lumbar spine is normal. No fracture line or displaced fracture fragment. No compression fracture deformity. Facet joints are normally aligned. Upper sacrum is intact and normally aligned. Degenerative spondylosis throughout the lumbar spine, mild to moderate in degree with associated disc space narrowings, vacuum disc phenomenon and degenerative facet hypertrophy. Associated osseous neural foramen narrowings at L5-S1 with possible associated nerve root impingement. No acute findings within the immediate paravertebral soft tissues. IMPRESSION: 1. No acute findings within the abdomen or pelvis. 2. No fracture or dislocation within the lumbar spine, sacrum or osseous pelvis. 3. Aortic atherosclerosis. 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 Recommend followup by ultrasound in 3 years. This recommendation follows ACR consensus guidelines: White Paper of the ACR Incidental Findings Committee II on Vascular Findings. J Am Coll Radiol 2013; 10:789-794. Aortic aneurysm NOS (ICD10-I71.9) 4. Small hiatal hernia. 5. Degenerative spondylosis of the lumbar spine, mild to moderate in degree with associated neural foramen encroachments at the L5-S1 level. If symptoms could be radiculopathic in nature, would consider nonemergent lumbar spine MRI to exclude associated nerve root impingement. Aortic Atherosclerosis (ICD10-I70.0). Electronically Signed   By: Franki Cabot M.D.   On: 08/16/2019 13:21   Dg Knee Complete 4 Views Left  Result Date: 08/16/2019 CLINICAL DATA:  Fall, chronic left knee pain EXAM: LEFT KNEE - COMPLETE 4+ VIEW COMPARISON:  None. FINDINGS: No evidence of fracture, dislocation, or joint effusion. No evidence of arthropathy or other focal bone  abnormality. Soft tissues are unremarkable. IMPRESSION: Negative. Electronically Signed   By: Rolm Baptise M.D.   On: 08/16/2019 10:01   Dg Hips Bilat W Or Wo Pelvis 3-4 Views  Result Date: 08/16/2019 CLINICAL DATA:  Bilateral hip pain after fall. EXAM: DG HIP (WITH OR WITHOUT PELVIS) 3-4V BILAT COMPARISON:  None. FINDINGS: There is no evidence of hip fracture or dislocation. There is no evidence of arthropathy or other focal bone abnormality. IMPRESSION: Negative. Electronically Signed   By: Marijo Conception M.D.   On: 08/16/2019 09:15     IMPRESSION:   *     FOBT positive dark stool with progressive acute on chronic anemia. The dark and FOBT positivity may be due to presence of iron taken TID, cannot rule out GI blood loss due to AVMs, neoplasia etc, ulcers.Marland Kitchen  Progressive, advanced renal disease also likely contributing to anemia.  *    Worsening of CKD, previously stage 4-5, currently stage 5.  Not oliguric per pt report.    *   Mechanical falls at home without syncope.  No signs of musculoskeletal trauma or bruising on exam or imaging studies.  PLAN:     *    Case d/w Dr. Henrene Pastor.  Given her advanced kidney disease, he does not feel comfortable prepping her for or pursuing colonoscopy.  Will proceed with upper endoscopy tomorrow morning.   COVID-19 testing in progress.  *   Patient needs input from nephrology.  Has consult been requested??  *    Ok to have solid foods, NPO after midnight.  Orders written.    *    Empiric Protonix 40 mg/day added.  Can decide if this needs to continue based on tomorrow's upper endoscopy findings  *     Obtain anemia, B12, folate studies which are currently processing.  May require Feraheme infusion.  Initiation of CSA?, defer to nephrology.     Azucena Freed  08/16/2019, 2:17 PM Phone [phone]  GI ATTENDING  History, laboratories, x-rays, prior colonoscopy and endoscopy reports reviewed.  Patient seen and examined.  Agree with comprehensive  consultation note as outlined above.  Patient with chronic anemia secondary to progressive chronic renal insufficiency and possible contributions from GI AVMs.  Prior evaluation for iron deficiency anemia in 2017 revealed an unremarkable EGD and an isolated nonbleeding colonic AVM.  Chronic iron therapy recommended.  Patient is on iron.  Due to progressive anemia, recent plans were for outpatient colonoscopy (which has been apparently postponed on a few occasions by the patient) with possible AVM ablation therapy.  In any event, she presents after falling, without hypotension or acute bleeding.  Creatinine now greater than 6.  She does take NSAIDs.  The patient is being admitted and GI consult requested.  I would perform repeat diagnostic upper endoscopy.  She certainly may benefit from colonoscopy and consideration of AVM ablation, if any noted.  However, in the absence of acute bleeding, I would be reluctant to prep this patient given the degree of renal insufficiency and no indication for dialysis.  She should be evaluated by renal regarding the need for access for impending dialysis.  As well, she may benefit from Epogen and Feraheme infusion therapies.  This may be enough to keep her hemoglobin in acceptable and stable range-since she has not had overt bleeding.The nature of the procedure, as well as the risks, benefits, and alternatives were carefully and thoroughly reviewed with the patient. Ample time for discussion and questions allowed. The patient understood, was satisfied, and agreed to proceed.  The patient is high risk for her procedure given her comorbidities.  Docia Chuck. Geri Seminole., M.D. [facility]

## 2019-08-16 NOTE — H&P (View-Only) (Signed)
[facility] Consult: 2:16 PM 08/16/2019  LOS: 0 days    Referring Provider: Eliezer Mccoy, PA-C in the ED. Primary Care Physician:  [facility] Gastroenterologist:  Dr. Silvano Rusk    Reason for Consultation: Anemia, FOBT positive.   HPI: [name] [name] is a 68 y.o. female.  Multiple medical problems outlined below but significant for IDDM.  Stage 4-5 CKD.  CVA.  CAD.  CABG 2013.  CHF.  Cocaine abuse  03/2016 EGD: For evaluation of IDA, FOBT positive.  Medium sized hiatal hernia, no Cameron's lesions.  Otherwise normal study. 03/2016 colonoscopy: solitary, nonbleeding colonic AVM at mid ascending colon, not treated.  2-day admission in early June with acute, hypercapneiic, hypoxic respiratory failure in setting of cocaine use.  Elevated troponin and transient Mobitz type II, second-degree AV block managed conservatively.  Last GI interaction was telehealth visit, anemia referral, on 05/27/2019. Dr. Carlean Purl suspected she was bleeding from AVM(s) in the colon.   Set up for colonoscopy 6/23 but canceled when she was not able to purchase the  bowel prep.  She went as far as having COVID-19 testing on 6/19 (negative then as well as in late April and early June).  Repeat colonoscopy set for 7/16 canceled when pt unable to find transportation for pre-procedure COVID testing. GI staff has been having problems communicating with pt through either of her phone numbers, she does not have a voicemail.  Staff reached out to her aunt Earlie Server and attempt to get the patient to call GI.    Presented to the ED for evaluation of pain in her left leg and sacrum.  She has recently fallen twice after tripping on rugs.  Denies syncope.  A few days of progressive weakness.  Dyspnea is not mentioned in  notes, patient denies SOB.  Chest pain resolved with SL nitroglycerin. Labs reveal HGB 7.3, MCV 81. Hgb ranging 10.5-11.6 in 05/2019, 8-9 in April and May. Normal platelets On DRE stool is dark but not melenic.  Tests FOBT +.   Also significant is ongoing, progressive CKD.  GFR has gone from 14 to 8 BNP is elevated to 250. CTAP without contrast:   No acute trauma, fractures.  Aortic atherosclerosis, 3.1cm infrarenal AAA.  Small HH.  Degenerative lumbar spine spondylosis. CXR with vascular congestion and centimeters X-rays of lumbar spine and knee are negative for acute fractures  Reviewed and note that she did receive Feraheme twice in 2017 but no documented repeat Feraheme therapy after that. She is followed by nephrology, Dr. Pearson Grippe.  Last seen in July at which time Lasix dose was increased.  She has not been set up for vascular/dialysis access procedures nor has she ever been treated with CSA (colony-stimulating agents, such as Epogen).   Takes 3 iron tablets daily, Ibuprofen 200mg  daily, ASA 325mg , daily Linzess.  No PPI or H2 blocker Appetite excellent, no significant weight fluctuation.  No new swelling of the limbs.  No abdominal pain, no nausea, vomiting. Stools are chronically dark due to the oral iron.  Today stool is on par with her norm.  Last smoked cocaine about 2 weeks ago.  Smokes cigarettes.  Does not use alcoholic beverages.  Lives with her significant other partner for the last 25 years.  Past Medical History:  Diagnosis Date   Acute respiratory failure with hypoxia and hypercapnia (HCC) 06/01/2019   Anemia 08/2016   Angiodysplasia of colon    Arthritis of left shoulder region 03/23/2013   Cardiomegaly 05/2019   Chronic combined systolic and diastolic CHF (congestive heart failure) (HCC)    a. EF 40-45%, mild LVH, mid apicalanteroseptal and apical HK.   CKD (chronic kidney disease), stage III ([place])    Cocaine abuse ([place])    crack cocaine heavily until  2008 then sporadic use since then   Coronary artery disease    a. 06/2012 NSTEMI/CABG x 3 (LIMA->LAD, VG->OM2, VG->LCX);  b. 04/2015 MV: EF<30%, mid ant, apicalanterior, apical infarct;  c. 04/2015 Cath: LM nl, LAD 90p, LCX 62m, OM1 min irregs, RCA mild dzs, LIMA->LAD nl w/ dist LAD dzs, VG->OM2 nl, VG->LCX nl-->Med Rx.   CVA (cerebral infarction)    a. right internal capsule stroke in 12/2006   Diabetes mellitus    diagnosed in 2008   Essential hypertension    Glaucoma    Gout    HFrEF (heart failure with reduced ejection fraction) (HCC)    Hyperlipidemia    Hyperparathyroidism, secondary renal (HCC)    Left-sided sensory deficit present    Obesity, morbid ([place])    Pneumonia    Pulmonary edema 05/2019   PVD (peripheral vascular disease) ([place])    a. 06/2012 ABI's: R - 0.73, L - 0.71.   Renal mass, right    Shortness of breath dyspnea    Stroke [facility])    Thyroid nodule    FNA in 4970 showed follicular cells but not definate neoplasm   Tobacco abuse    Trichomoniasis     Past Surgical History:  Procedure Laterality Date   CARDIAC CATHETERIZATION     CARDIAC CATHETERIZATION N/A 05/17/2015   Procedure: Left Heart Cath and Cors/Grafts Angiography;  Surgeon: [name], MD;  Location: [facility] CV LAB;  Service: Cardiovascular;  Laterality: N/A;   COLONOSCOPY WITH PROPOFOL N/A 04/21/2016   Procedure: COLONOSCOPY WITH PROPOFOL;  Surgeon: Irene Shipper, MD;  Location: [facility];  Service: Endoscopy;  Laterality: N/A;   CORONARY ARTERY BYPASS GRAFT  07/09/2012   Procedure: CORONARY ARTERY BYPASS GRAFTING (CABG);  Surgeon: Ivin Poot, MD;  Location: [facility];  Service: Open Heart Surgery;  Laterality: N/A;   ESOPHAGOGASTRODUODENOSCOPY N/A 04/20/2016   Procedure: ESOPHAGOGASTRODUODENOSCOPY (EGD);  Surgeon: Gatha Mayer, MD;  Location[facility] ENDOSCOPY;  Service: Endoscopy;  Laterality: N/A;   LEFT HEART CATHETERIZATION WITH CORONARY ANGIOGRAM N/A 06/29/2012    Procedure: LEFT HEART CATHETERIZATION WITH CORONARY ANGIOGRAM;  Surgeon: Peter M Martinique, MD;  Location: [facility] CATH LAB;  Service: Cardiovascular;  Laterality: N/A;   STERNAL WOUND DEBRIDEMENT  08/17/2012   Procedure: STERNAL WOUND DEBRIDEMENT;  Surgeon: Ivin Poot, MD;  Location: [facility];  Service: Thoracic;  Laterality: N/A;  wound vac application   STERNAL WOUND DEBRIDEMENT  08/24/2012   Procedure: STERNAL WOUND DEBRIDEMENT;  Surgeon: Ivin Poot, MD;  Location: [facility];  Service: Thoracic;  Laterality: N/A;   STERNAL WOUND DEBRIDEMENT  09/01/2012   Procedure: STERNAL WOUND DEBRIDEMENT;  Surgeon: Ivin Poot, MD;  Location: [facility];  Service: Thoracic;  Laterality: N/A;   [place]  09/20/2012   Procedure: STERNAL WOUND DEBRIDEMENT;  Surgeon: Ivin Poot, MD;  Location: [facility] OR;  Service: Thoracic;  Laterality: N/A;  wound vac change    Prior to Admission medications   Medication Sig Start Date End Date Taking? Authorizing Provider  allopurinol (ZYLOPRIM) 100 MG tablet Take 100 mg by mouth 2 (two) times daily.  11/25/17  Yes [provider]  amLODipine (NORVASC) 5 MG tablet Take 1 tablet (5 mg total) by mouth daily. 04/30/19 04/29/20 Yes [name], MD  aspirin EC 325 MG tablet Take 325 mg by mouth daily. 05/13/19  Yes [provider]  atorvastatin (LIPITOR) 40 MG tablet Take 1 tablet (40 mg total) by mouth daily at 6 PM. 06/03/19  Yes Hongalgi, Lenis Dickinson, MD  brinzolamide (AZOPT) 1 % ophthalmic suspension Place 1 drop into both eyes 2 (two) times a day.    Yes [provider]  buPROPion (WELLBUTRIN SR) 150 MG 12 hr tablet Take 1 tablet (150 mg total) by mouth 2 (two) times daily. 01/16/16  Yes Mikhail, Velta Addison, DO  cetirizine (ZYRTEC) 10 MG tablet Take 10 mg by mouth daily.    Yes [provider]  ferrous sulfate 325 (65 FE) MG tablet Take 325 mg by mouth 3 (three) times daily with meals.   Yes [provider]  furosemide (LASIX) 40 MG  tablet Take 1-2 tablets (40-80 mg total) by mouth See admin instructions. Take 2 tablets in the morning and 1 tablet at bedtime Patient taking differently: Take 40-80 mg by mouth See admin instructions. Take 80 mg in the morning and 40 mg at bedtime 06/03/19  Yes Hongalgi, Lenis Dickinson, MD  gabapentin (NEURONTIN) 300 MG capsule Take 1 capsule (300 mg total) by mouth 3 (three) times daily. 06/03/19  Yes Hongalgi, Lenis Dickinson, MD  glimepiride (AMARYL) 4 MG tablet Take 4 mg by mouth daily. 05/04/19  Yes [provider]  Insulin Glargine (LANTUS SOLOSTAR) 100 UNIT/ML Solostar Pen Inject 21 Units into the skin daily at 10 pm. 06/03/19  Yes Hongalgi, Lenis Dickinson, MD  ipratropium (ATROVENT HFA) 17 MCG/ACT inhaler Inhale 2 puffs into the lungs every 6 (six) hours as needed for wheezing.   Yes [provider]  latanoprost (XALATAN) 0.005 % ophthalmic solution Place 1 drop into both eyes at bedtime.   Yes [provider]  LINZESS 72 MCG capsule Take 72 mcg by mouth daily before breakfast.  11/21/17  Yes [provider]  metoprolol tartrate (LOPRESSOR) 25 MG tablet Take 25 mg by mouth 2 (two) times daily.   Yes [provider]  mometasone-formoterol (DULERA) 100-5 MCG/ACT AERO Inhale 2 puffs into the lungs every 4 (four) hours as needed for wheezing.   Yes [provider]  nitroGLYCERIN (NITROSTAT) 0.4 MG SL tablet Place 1 tablet (0.4 mg total) under the tongue every 5 (five) minutes as needed for chest pain. 04/30/19  Yes [name], MD  polyethylene glycol (MIRALAX / GLYCOLAX) 17 g packet Take 17 g by mouth daily as needed for mild constipation.    Yes [provider]  PROAIR HFA 108 (90 Base) MCG/ACT inhaler Inhale 2 puffs into the lungs every 6 (six) hours as needed for wheezing.  11/17/17  Yes [provider]  Grant Ruts INHUB 250-50 MCG/DOSE AEPB Inhale 2 puffs into the lungs daily.  05/04/19  Yes [provider]  pantoprazole (PROTONIX) 40 MG tablet Take 1  tablet (40 mg total) by mouth daily. Patient not taking: Reported on 06/15/2019 09/23/16  Thurnell Lose, MD    Scheduled Meds:  Infusions:  PRN Meds:    Allergies as of 08/16/2019 - Review Complete 08/16/2019  Allergen Reaction Noted   Naproxen Rash 09/21/2013    Family History  Problem Relation Age of Onset   Diabetes Mother    Hypertension Mother    Cancer Mother    Hyperlipidemia Father    Hypertension Father    Kidney disease Father    Gout Father    Cerebrovascular Accident Father    Other Other        no known family CAD    Social History   Socioeconomic History   Marital status: Single    Spouse name: Not on file   Number of children: 0   Years of education: Not on file   Highest education level: Not on file  Occupational History   Not on file  Social Needs   Financial resource strain: Not on file   Food insecurity    Worry: Not on file    Inability: Not on file   Transportation needs    Medical: Not on file    Non-medical: Not on file  Tobacco Use   Smoking status: Current Some Day Smoker    Packs/day: 0.25    Years: 50.00    Pack years: 12.50    Types: Cigarettes   Smokeless tobacco: Never Used  Substance and Sexual Activity   Alcohol use: No    Alcohol/week: 0.0 standard drinks   Drug use: Yes    Types: Cocaine    Comment: pt denies at current time   Sexual activity: Not Currently  Lifestyle   Physical activity    Days per week: Not on file    Minutes per session: Not on file   Stress: Not on file  Relationships   Social connections    Talks on phone: Not on file    Gets together: Not on file    Attends religious service: Not on file    Active member of club or organization: Not on file    Attends meetings of clubs or organizations: Not on file    Relationship status: Not on file   Intimate partner violence    Fear of current or ex partner: Not on file    Emotionally abused: Not on file     Physically abused: Not on file    Forced sexual activity: Not on file  Other Topics Concern   Not on file  Social History Narrative   Lives with boyfriend of 20 years.     REVIEW OF SYSTEMS: Constitutional: Per HPI.  Fatigue, weakness in recent days. ENT:  No nose bleeds Pulm: Denies shortness of breath, dyspnea, cough. CV:  No palpitations, no new LE edema.  Fleeting, nonexertional chest pain, resolved with nitroglycerin SL. GU:  No hematuria, no frequency.  Good urine output of yellow urine. GI: See HPI. Heme: No unusual bleeding or bruising. Transfusions: Received PRBCs on 2 occasions in 2017 Neuro:  No headaches, no peripheral tingling or numbness Derm:  No itching.  Chronic rash at the top of her thighs and groin. Endocrine:  No sweats or chills.  No polyuria or dysuria Immunization: Reviewed. Travel:  None beyond local counties in last few months.    PHYSICAL EXAM: Vital signs in last 24 hours: Vitals:   08/16/19 1030 08/16/19 1115  BP: (!) 142/90 138/65  Pulse: (!) 54 65  Resp: 19 17  Temp:    SpO2: 98%  99%   Wt Readings from Last 3 Encounters:  06/03/19 124 kg  05/27/19 128.4 kg  04/30/19 134.2 kg    General: Obese, pleasant, comfortable, non-ill-appearing AAF Head: No facial asymmetry or swelling.  No signs of head trauma. Eyes: No scleral icterus.  No conjunctival pallor.  EOMI. Ears: Not hard of hearing Nose: No discharge or congestion Mouth: Oral mucosa pink, moist, clear.  Tongue midline. Neck: No JVD, no masses, no thyromegaly. Lungs: Clear bilaterally though somewhat diminished globally.  No labored breathing.  No cough.  Vocal quality rough. Heart: RRR.  No MRG.  S1, S2 present.  Overall heart sounds distant. Abdomen: Soft.  Not tender or distended no HSM, masses, bruits, hernias.  Intertriginous rash at the top of the thighs/groins bil..   Rectal: Not repeated.  Stool was dark, FOBT positive but not melenic, no rectal masses per DRE by ED physician  assistant Musc/Skeltl: No joint redness, swelling or gross deformity. Extremities: Obese limbs but no pitting edema. Neurologic: No asterixis, tremors.  Moves all 4 limbs, strength not tested.  Alert and oriented x3.  Good historian. Skin: Intertriginous rash as per abdominal exam.  Looked at both sides of limbs as well as torso and do not see significant bruising or signs of trauma anywhere Tattoos: None observed. Nodes: No cervical adenopathy. Psych: Calm, pleasant, cooperative.  Intake/Output from previous day: No intake/output data recorded. Intake/Output this shift: Total I/O In: 500 [IV Piggyback:500] Out: -   LAB RESULTS: Recent Labs    08/16/19 0934  WBC 10.9*  HGB 7.3*  HCT 25.8*  PLT 458*   BMET Lab Results  Component Value Date   NA 138 08/16/2019   NA 140 06/03/2019   NA 142 06/02/2019   K 4.3 08/16/2019   K 4.9 06/03/2019   K 4.7 06/02/2019   CL 105 08/16/2019   CL 110 06/03/2019   CL 111 06/02/2019   CO2 20 (L) 08/16/2019   CO2 19 (L) 06/03/2019   CO2 20 (L) 06/02/2019   GLUCOSE 142 (H) 08/16/2019   GLUCOSE 145 (H) 06/03/2019   GLUCOSE 257 (H) 06/02/2019   BUN 132 (H) 08/16/2019   BUN 52 (H) 06/03/2019   BUN 41 (H) 06/02/2019   CREATININE 6.11 (H) 08/16/2019   CREATININE 3.57 (H) 06/03/2019   CREATININE 3.34 (H) 06/02/2019   CALCIUM 8.6 (L) 08/16/2019   CALCIUM 8.9 06/03/2019   CALCIUM 9.3 06/02/2019   LFT Recent Labs    08/16/19 0934  PROT 6.8  ALBUMIN 3.2*  AST 12*  ALT 11  ALKPHOS 109  BILITOT 0.4   PT/INR Lab Results  Component Value Date   INR 1.2 04/28/2019   INR 1.17 09/23/2016   INR 1.12 04/17/2016   Hepatitis Panel No results for input(s): HEPBSAG, HCVAB, HEPAIGM, HEPBIGM in the last 72 hours. C-Diff No components found for: CDIFF Lipase     Component Value Date/Time   LIPASE 31 04/18/2016 1124    Drugs of Abuse     Component Value Date/Time   LABOPIA NONE DETECTED 04/28/2019 0720   COCAINSCRNUR POSITIVE (A)  04/28/2019 0720   LABBENZ NONE DETECTED 04/28/2019 0720   AMPHETMU NONE DETECTED 04/28/2019 0720   THCU NONE DETECTED 04/28/2019 0720   LABBARB NONE DETECTED 04/28/2019 0720     RADIOLOGY STUDIES: Ct Abdomen Pelvis Wo Contrast  Result Date: 08/16/2019 CLINICAL DATA:  Fall, leg and sacral pain. EXAM: CT ABDOMEN AND PELVIS WITHOUT CONTRAST CT LUMBAR SPINE WITHOUT CONTRAST TECHNIQUE: Multidetector CT imaging  of the abdomen and pelvis was performed following the standard protocol without IV contrast. COMPARISON:  CT abdomen dated 09/18/2016. FINDINGS: Lower chest: No acute findings. Small hiatal hernia. Hepatobiliary: No focal liver abnormality is seen. No gallstones, gallbladder wall thickening, or biliary dilatation. Pancreas: Unremarkable. No pancreatic ductal dilatation or surrounding inflammatory changes. Spleen: Normal in size without focal abnormality. Adrenals/Urinary Tract: Adrenal glands are unremarkable. Kidneys are unremarkable without mass, stone or hydronephrosis. No evidence of renal injury. No perinephric fluid. Stomach/Bowel: No dilated large or small bowel loops. No evidence of bowel wall inflammation or bowel injury.w stomach is unremarkable, partially decompressed. Vascular/Lymphatic: Aortic atherosclerosis. 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 No enlarged lymph nodes seen in the abdomen or pelvis. Reproductive: Uterus and bilateral adnexa are unremarkable. Other: No free fluid or hemorrhage within the abdomen or pelvis. No free intraperitoneal air. Musculoskeletal: No osseous fracture or dislocation seen. Specifically, fracture within the osseous pelvis or lower lumbar spine. Degenerative spondylosis of the lower lumbar spine, moderate in degree with suspected associated neural foramen encroachments at the L5-S1 level. LUMBAR SPINE CT FINDINGS: Alignment of the lumbar spine is normal. No fracture line or displaced fracture fragment. No compression  fracture deformity. Facet joints are normally aligned. Upper sacrum is intact and normally aligned. Degenerative spondylosis throughout the lumbar spine, mild to moderate in degree with associated disc space narrowings, vacuum disc phenomenon and degenerative facet hypertrophy. Associated osseous neural foramen narrowings at L5-S1 with possible associated nerve root impingement. No acute findings within the immediate paravertebral soft tissues. IMPRESSION: 1. No acute findings within the abdomen or pelvis. 2. No fracture or dislocation within the lumbar spine, sacrum or osseous pelvis. 3. Aortic atherosclerosis. 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 Recommend followup by ultrasound in 3 years. This recommendation follows ACR consensus guidelines: White Paper of the ACR Incidental Findings Committee II on Vascular Findings. J Am Coll Radiol 2013; 10:789-794. Aortic aneurysm NOS (ICD10-I71.9) 4. Small hiatal hernia. 5. Degenerative spondylosis of the lumbar spine, mild to moderate in degree with associated neural foramen encroachments at the L5-S1 level. If symptoms could be radiculopathic in nature, would consider nonemergent lumbar spine MRI to exclude associated nerve root impingement. Aortic Atherosclerosis (ICD10-I70.0). Electronically Signed   By: Franki Cabot M.D.   On: 08/16/2019 13:21   Dg Chest 2 View  Result Date: 08/16/2019 CLINICAL DATA:  Shortness of breath, chest pain EXAM: CHEST - 2 VIEW COMPARISON:  06/01/2019 FINDINGS: Cardiomegaly with vascular congestion. Prior CABG. No overt edema. No confluent opacities or effusions. No acute bony abnormality. IMPRESSION: Cardiomegaly, vascular congestion. Electronically Signed   By: Rolm Baptise M.D.   On: 08/16/2019 09:59   Dg Lumbar Spine Complete  Result Date: 08/16/2019 CLINICAL DATA:  Lower back and bilateral hip pain after fall. EXAM: LUMBAR SPINE - COMPLETE 4+ VIEW COMPARISON:  None. FINDINGS: No fracture or  spondylolisthesis is noted. Mild degenerative disc disease is noted at L2-3 and L5-S1. Atherosclerosis of abdominal aorta is noted. Abdominal aortic aneurysm cannot be excluded. Posterior facet joints are unremarkable. IMPRESSION: Mild multilevel degenerative disc disease. No acute abnormality seen in the lumbar spine. Possible abdominal aortic aneurysm is noted. Ultrasound is recommended for further evaluation. Aortic Atherosclerosis (ICD10-I70.0). Electronically Signed   By: Marijo Conception M.D.   On: 08/16/2019 09:14   Ct L-spine No Charge  Result Date: 08/16/2019 CLINICAL DATA:  Fall, leg and sacral pain. EXAM: CT ABDOMEN AND PELVIS WITHOUT CONTRAST CT  LUMBAR SPINE WITHOUT CONTRAST TECHNIQUE: Multidetector CT imaging of the abdomen and pelvis was performed following the standard protocol without IV contrast. COMPARISON:  CT abdomen dated 09/18/2016. FINDINGS: Lower chest: No acute findings. Small hiatal hernia. Hepatobiliary: No focal liver abnormality is seen. No gallstones, gallbladder wall thickening, or biliary dilatation. Pancreas: Unremarkable. No pancreatic ductal dilatation or surrounding inflammatory changes. Spleen: Normal in size without focal abnormality. Adrenals/Urinary Tract: Adrenal glands are unremarkable. Kidneys are unremarkable without mass, stone or hydronephrosis. No evidence of renal injury. No perinephric fluid. Stomach/Bowel: No dilated large or small bowel loops. No evidence of bowel wall inflammation or bowel injury.w stomach is unremarkable, partially decompressed. Vascular/Lymphatic: Aortic atherosclerosis. 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 No enlarged lymph nodes seen in the abdomen or pelvis. Reproductive: Uterus and bilateral adnexa are unremarkable. Other: No free fluid or hemorrhage within the abdomen or pelvis. No free intraperitoneal air. Musculoskeletal: No osseous fracture or dislocation seen. Specifically, fracture within the  osseous pelvis or lower lumbar spine. Degenerative spondylosis of the lower lumbar spine, moderate in degree with suspected associated neural foramen encroachments at the L5-S1 level. LUMBAR SPINE CT FINDINGS: Alignment of the lumbar spine is normal. No fracture line or displaced fracture fragment. No compression fracture deformity. Facet joints are normally aligned. Upper sacrum is intact and normally aligned. Degenerative spondylosis throughout the lumbar spine, mild to moderate in degree with associated disc space narrowings, vacuum disc phenomenon and degenerative facet hypertrophy. Associated osseous neural foramen narrowings at L5-S1 with possible associated nerve root impingement. No acute findings within the immediate paravertebral soft tissues. IMPRESSION: 1. No acute findings within the abdomen or pelvis. 2. No fracture or dislocation within the lumbar spine, sacrum or osseous pelvis. 3. Aortic atherosclerosis. 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 Recommend followup by ultrasound in 3 years. This recommendation follows ACR consensus guidelines: White Paper of the ACR Incidental Findings Committee II on Vascular Findings. J Am Coll Radiol 2013; 10:789-794. Aortic aneurysm NOS (ICD10-I71.9) 4. Small hiatal hernia. 5. Degenerative spondylosis of the lumbar spine, mild to moderate in degree with associated neural foramen encroachments at the L5-S1 level. If symptoms could be radiculopathic in nature, would consider nonemergent lumbar spine MRI to exclude associated nerve root impingement. Aortic Atherosclerosis (ICD10-I70.0). Electronically Signed   By: Franki Cabot M.D.   On: 08/16/2019 13:21   Dg Knee Complete 4 Views Left  Result Date: 08/16/2019 CLINICAL DATA:  Fall, chronic left knee pain EXAM: LEFT KNEE - COMPLETE 4+ VIEW COMPARISON:  None. FINDINGS: No evidence of fracture, dislocation, or joint effusion. No evidence of arthropathy or other focal bone  abnormality. Soft tissues are unremarkable. IMPRESSION: Negative. Electronically Signed   By: Rolm Baptise M.D.   On: 08/16/2019 10:01   Dg Hips Bilat W Or Wo Pelvis 3-4 Views  Result Date: 08/16/2019 CLINICAL DATA:  Bilateral hip pain after fall. EXAM: DG HIP (WITH OR WITHOUT PELVIS) 3-4V BILAT COMPARISON:  None. FINDINGS: There is no evidence of hip fracture or dislocation. There is no evidence of arthropathy or other focal bone abnormality. IMPRESSION: Negative. Electronically Signed   By: Marijo Conception M.D.   On: 08/16/2019 09:15     IMPRESSION:   *     FOBT positive dark stool with progressive acute on chronic anemia. The dark and FOBT positivity may be due to presence of iron taken TID, cannot rule out GI blood loss due to AVMs, neoplasia etc, ulcers.Marland Kitchen  Progressive, advanced renal disease also likely contributing to anemia.  *    Worsening of CKD, previously stage 4-5, currently stage 5.  Not oliguric per pt report.    *   Mechanical falls at home without syncope.  No signs of musculoskeletal trauma or bruising on exam or imaging studies.  PLAN:     *    Case d/w Dr. Henrene Pastor.  Given her advanced kidney disease, he does not feel comfortable prepping her for or pursuing colonoscopy.  Will proceed with upper endoscopy tomorrow morning.   COVID-19 testing in progress.  *   Patient needs input from nephrology.  Has consult been requested??  *    Ok to have solid foods, NPO after midnight.  Orders written.    *    Empiric Protonix 40 mg/day added.  Can decide if this needs to continue based on tomorrow's upper endoscopy findings  *     Obtain anemia, B12, folate studies which are currently processing.  May require Feraheme infusion.  Initiation of CSA?, defer to nephrology.     Azucena Freed  08/16/2019, 2:17 PM Phone [phone]  GI ATTENDING  History, laboratories, x-rays, prior colonoscopy and endoscopy reports reviewed.  Patient seen and examined.  Agree with comprehensive  consultation note as outlined above.  Patient with chronic anemia secondary to progressive chronic renal insufficiency and possible contributions from GI AVMs.  Prior evaluation for iron deficiency anemia in 2017 revealed an unremarkable EGD and an isolated nonbleeding colonic AVM.  Chronic iron therapy recommended.  Patient is on iron.  Due to progressive anemia, recent plans were for outpatient colonoscopy (which has been apparently postponed on a few occasions by the patient) with possible AVM ablation therapy.  In any event, she presents after falling, without hypotension or acute bleeding.  Creatinine now greater than 6.  She does take NSAIDs.  The patient is being admitted and GI consult requested.  I would perform repeat diagnostic upper endoscopy.  She certainly may benefit from colonoscopy and consideration of AVM ablation, if any noted.  However, in the absence of acute bleeding, I would be reluctant to prep this patient given the degree of renal insufficiency and no indication for dialysis.  She should be evaluated by renal regarding the need for access for impending dialysis.  As well, she may benefit from Epogen and Feraheme infusion therapies.  This may be enough to keep her hemoglobin in acceptable and stable range-since she has not had overt bleeding.The nature of the procedure, as well as the risks, benefits, and alternatives were carefully and thoroughly reviewed with the patient. Ample time for discussion and questions allowed. The patient understood, was satisfied, and agreed to proceed.  The patient is high risk for her procedure given her comorbidities.  Docia Chuck. Geri Seminole., M.D. [facility] Division of Gastroenterology

## 2019-08-16 NOTE — ED Provider Notes (Addendum)
[facility] EMERGENCY DEPARTMENT Provider Note   CSN: [id number] Arrival date & time: 08/16/19  6468    History   Chief Complaint Chief Complaint  Patient presents with   Fall    HPI [name] [name] is a 68 y.o. female with history of morbid obesity, hypertension, diabetes, CAD, CKD, stroke who presents with low back pain following fall.  Patient reports she has had 2 falls in the last 2 days.  She has not hit her head or loss consciousness.  She is not anticoagulated.  She states she believes she tripped over her bathroom rug twice.  She did not feel dizzy or lightheaded prior to falling.  She is complaining of bilateral hip pain, low back and left knee pain.  Patient reports she has been feeling generally weak in her legs.  She does not see huge difference in her chronic L knee. Patient also reports worsening shortness of breath the last couple days with chest pain. It was been improved with home nitroglycerin.     HPI  Past Medical History:  Diagnosis Date   Acute respiratory failure with hypoxia and hypercapnia (HCC) 06/01/2019   Anemia 08/2016   Angiodysplasia of colon    Arthritis of left shoulder region 03/23/2013   Cardiomegaly 05/2019   Chronic combined systolic and diastolic CHF (congestive heart failure) (HCC)    a. EF 40-45%, mild LVH, mid apicalanteroseptal and apical HK.   CKD (chronic kidney disease), stage III ([place])    Cocaine abuse ([place])    crack cocaine heavily until 2008 then sporadic use since then   Coronary artery disease    a. 06/2012 NSTEMI/CABG x 3 (LIMA->LAD, VG->OM2, VG->LCX);  b. 04/2015 MV: EF<30%, mid ant, apicalanterior, apical infarct;  c. 04/2015 Cath: LM nl, LAD 90p, LCX 53m, OM1 min irregs, RCA mild dzs, LIMA->LAD nl w/ dist LAD dzs, VG->OM2 nl, VG->LCX nl-->Med Rx.   CVA (cerebral infarction)    a. right internal capsule stroke in 12/2006   Diabetes mellitus    diagnosed in 2008   Essential hypertension    Glaucoma     Gout    HFrEF (heart failure with reduced ejection fraction) (HCC)    Hyperlipidemia    Hyperparathyroidism, secondary renal (HCC)    Left-sided sensory deficit present    Obesity, morbid ([place])    Pneumonia    Pulmonary edema 05/2019   PVD (peripheral vascular disease) ([place])    a. 06/2012 ABI's: R - 0.73, L - 0.71.   Renal mass, right    Shortness of breath dyspnea    Stroke [facility])    Thyroid nodule    FNA in 0321 showed follicular cells but not definate neoplasm   Tobacco abuse    Trichomoniasis     Patient Active Problem List   Diagnosis Date Noted   GI bleed 08/16/2019   Acute respiratory failure with hypoxia and hypercapnia ([place]) 06/02/2019   Hypertensive crisis 06/02/2019   Acute on chronic combined systolic and diastolic CHF (congestive heart failure) ([place]) 06/02/2019   Acute on chronic respiratory failure with hypoxia (HCC) 04/27/2019   Elevated troponin 04/27/2019   Lobar pneumonia ([place]) 04/27/2019   Sepsis ([place]) 04/27/2019   Hyperkalemia 04/27/2019   Fever    Symptomatic anemia 09/16/2016   Diabetes mellitus with complication ([place]) 22/48/2500   Obesity 04/29/2016   Hiatal hernia    Heme + stool    Absolute anemia    Bleeding gastrointestinal    Insulin dependent diabetes mellitus (  [place])    Pain in the chest    Demand ischemia ([place])    Iron deficiency anemia    Precordial pain    Chest pain 04/17/2016   Thrombocytosis ([place]) 04/17/2016   Microcytic anemia 02/05/2016   Abscess    Acute systolic heart failure ([place])    Acute on chronic renal failure (HCC)    CAD in native artery    Substance abuse ([place])    Chronic combined systolic and diastolic CHF (congestive heart failure) ([place])    CKD (chronic kidney disease) stage 4, GFR 15-29 ml/min (HCC)    Demand ischemia of myocardium - related to Acue HF exacerbation 05/14/2015   Obesity, Class III, BMI 40-49.9 (morbid obesity) ([place])    Acute on chronic combined  systolic and diastolic congestive heart failure ([place]) 05/12/2015   Acute respiratory failure ([place]) 10/18/2014   Type 2 diabetes mellitus with renal manifestations, controlled ([place]) 10/18/2014   S/P CABG (coronary artery bypass graft) 09/21/2013   Arthritis of right shoulder region 03/23/2013   Tobacco abuse    Coronary artery disease    NSTEMI (non-ST elevated myocardial infarction) ([place]) 06/29/2012   History of cocaine abuse ([place]) 06/29/2012   AKI (acute kidney injury) ([place]) 06/29/2012   Essential hypertension 06/29/2012   RBC microcytosis 06/29/2012   Glaucoma    Hyperlipidemia    History of CVA (cerebrovascular accident)     Past Surgical History:  Procedure Laterality Date   CARDIAC CATHETERIZATION     CARDIAC CATHETERIZATION N/A 05/17/2015   Procedure: Left Heart Cath and Cors/Grafts Angiography;  Surgeon: [name], MD;  Location: [place] CV LAB;  Service: Cardiovascular;  Laterality: N/A;   COLONOSCOPY WITH PROPOFOL N/A 04/21/2016   Procedure: COLONOSCOPY WITH PROPOFOL;  Surgeon: Irene Shipper, MD;  Location: [place];  Service: Endoscopy;  Laterality: N/A;   CORONARY ARTERY BYPASS GRAFT  07/09/2012   Procedure: CORONARY ARTERY BYPASS GRAFTING (CABG);  Surgeon: Ivin Poot, MD;  Location: [place];  Service: Open Heart Surgery;  Laterality: N/A;   ESOPHAGOGASTRODUODENOSCOPY N/A 04/20/2016   Procedure: ESOPHAGOGASTRODUODENOSCOPY (EGD);  Surgeon: Gatha Mayer, MD;  Location: [facility] ENDOSCOPY;  Service: Endoscopy;  Laterality: N/A;   LEFT HEART CATHETERIZATION WITH CORONARY ANGIOGRAM N/A 06/29/2012   Procedure: LEFT HEART CATHETERIZATION WITH CORONARY ANGIOGRAM;  Surgeon: Peter M Martinique, MD;  Location: [facility] CATH LAB;  Service: Cardiovascular;  Laterality: N/A;   STERNAL WOUND DEBRIDEMENT  08/17/2012   Procedure: STERNAL WOUND DEBRIDEMENT;  Surgeon: Ivin Poot, MD;  Location: [place];  Service: Thoracic;  Laterality: N/A;  wound vac application   STERNAL  WOUND DEBRIDEMENT  08/24/2012   Procedure: STERNAL WOUND DEBRIDEMENT;  Surgeon: Ivin Poot, MD;  Location: [place];  Service: Thoracic;  Laterality: N/A;   STERNAL WOUND DEBRIDEMENT  09/01/2012   Procedure: STERNAL WOUND DEBRIDEMENT;  Surgeon: Ivin Poot, MD;  Location: [place];  Service: Thoracic;  Laterality: N/A;   STERNAL WOUND DEBRIDEMENT  09/20/2012   Procedure: STERNAL WOUND DEBRIDEMENT;  Surgeon: Ivin Poot, MD;  Location: [facility] OR;  Service: Thoracic;  Laterality: N/A;  wound vac change     OB History   No obstetric history on file.      Home Medications    Prior to Admission medications   Medication Sig Start Date End Date Taking? Authorizing Provider  allopurinol (ZYLOPRIM) 100 MG tablet Take 100 mg by mouth 2 (two) times daily.  11/25/17  Yes [provider]  amLODipine (NORVASC) 5  MG tablet Take 1 tablet (5 mg total) by mouth daily. 04/30/19 04/29/20 Yes [name], MD  aspirin EC 325 MG tablet Take 325 mg by mouth daily. 05/13/19  Yes [provider]  atorvastatin (LIPITOR) 40 MG tablet Take 1 tablet (40 mg total) by mouth daily at 6 PM. 06/03/19  Yes Hongalgi, Lenis Dickinson, MD  brinzolamide (AZOPT) 1 % ophthalmic suspension Place 1 drop into both eyes 2 (two) times a day.    Yes [provider]  buPROPion (WELLBUTRIN SR) 150 MG 12 hr tablet Take 1 tablet (150 mg total) by mouth 2 (two) times daily. 01/16/16  Yes Mikhail, Velta Addison, DO  cetirizine (ZYRTEC) 10 MG tablet Take 10 mg by mouth daily.    Yes [provider]  ferrous sulfate 325 (65 FE) MG tablet Take 325 mg by mouth 3 (three) times daily with meals.   Yes [provider]  furosemide (LASIX) 40 MG tablet Take 1-2 tablets (40-80 mg total) by mouth See admin instructions. Take 2 tablets in the morning and 1 tablet at bedtime Patient taking differently: Take 40-80 mg by mouth See admin instructions. Take 80 mg in the morning and 40 mg at bedtime 06/03/19  Yes Hongalgi, Lenis Dickinson, MD    gabapentin (NEURONTIN) 300 MG capsule Take 1 capsule (300 mg total) by mouth 3 (three) times daily. 06/03/19  Yes Hongalgi, Lenis Dickinson, MD  glimepiride (AMARYL) 4 MG tablet Take 4 mg by mouth daily. 05/04/19  Yes [provider]  Insulin Glargine (LANTUS SOLOSTAR) 100 UNIT/ML Solostar Pen Inject 21 Units into the skin daily at 10 pm. 06/03/19  Yes Hongalgi, Lenis Dickinson, MD  ipratropium (ATROVENT HFA) 17 MCG/ACT inhaler Inhale 2 puffs into the lungs every 6 (six) hours as needed for wheezing.   Yes [provider]  latanoprost (XALATAN) 0.005 % ophthalmic solution Place 1 drop into both eyes at bedtime.   Yes [provider]  LINZESS 72 MCG capsule Take 72 mcg by mouth daily before breakfast.  11/21/17  Yes [provider]  metoprolol tartrate (LOPRESSOR) 25 MG tablet Take 25 mg by mouth 2 (two) times daily.   Yes [provider]  mometasone-formoterol (DULERA) 100-5 MCG/ACT AERO Inhale 2 puffs into the lungs every 4 (four) hours as needed for wheezing.   Yes [provider]  nitroGLYCERIN (NITROSTAT) 0.4 MG SL tablet Place 1 tablet (0.4 mg total) under the tongue every 5 (five) minutes as needed for chest pain. 04/30/19  Yes [name], MD  polyethylene glycol (MIRALAX / GLYCOLAX) 17 g packet Take 17 g by mouth daily as needed for mild constipation.    Yes [provider]  PROAIR HFA 108 (90 Base) MCG/ACT inhaler Inhale 2 puffs into the lungs every 6 (six) hours as needed for wheezing.  11/17/17  Yes [provider]  Grant Ruts INHUB 250-50 MCG/DOSE AEPB Inhale 2 puffs into the lungs daily.  05/04/19  Yes [provider]  pantoprazole (PROTONIX) 40 MG tablet Take 1 tablet (40 mg total) by mouth daily. Patient not taking: Reported on 06/15/2019 09/23/16   Thurnell Lose, MD    Family History Family History  Problem Relation Age of Onset   Diabetes Mother    Hypertension Mother    Cancer Mother    Hyperlipidemia Father     Hypertension Father    Kidney disease Father    Gout Father    Cerebrovascular Accident Father    Other Other  no known family CAD    Social History Social History   Tobacco Use   Smoking status: Current Some Day Smoker    Packs/day: 0.25    Years: 50.00    Pack years: 12.50    Types: Cigarettes   Smokeless tobacco: Never Used  Substance Use Topics   Alcohol use: No    Alcohol/week: 0.0 standard drinks   Drug use: Yes    Types: Cocaine    Comment: pt denies at current time     Allergies   Naproxen   Review of Systems Review of Systems  Constitutional: Negative for chills and fever.  HENT: Negative for facial swelling and sore throat.   Respiratory: Positive for shortness of breath.   Cardiovascular: Positive for chest pain.  Gastrointestinal: Negative for abdominal pain, nausea and vomiting.  Genitourinary: Negative for dysuria.  Musculoskeletal: Positive for arthralgias and back pain.  Skin: Negative for rash and wound.  Neurological: Negative for dizziness, syncope, light-headedness and headaches.  Psychiatric/Behavioral: The patient is not nervous/anxious.      Physical Exam Updated Vital Signs BP (!) 129/91    Pulse 60    Temp 99 F (37.2 C)    Resp 16    SpO2 100%   Physical Exam Vitals signs and nursing note reviewed.  Constitutional:      General: She is not in acute distress.    Appearance: She is well-developed. She is not diaphoretic.  HENT:     Head: Normocephalic and atraumatic.     Mouth/Throat:     Pharynx: No oropharyngeal exudate.  Eyes:     General: No scleral icterus.       Right eye: No discharge.        Left eye: No discharge.     Conjunctiva/sclera: Conjunctivae normal.     Pupils: Pupils are equal, round, and reactive to light.  Neck:     Musculoskeletal: Normal range of motion and neck supple.     Thyroid: No thyromegaly.  Cardiovascular:     Rate and Rhythm: Normal rate and regular rhythm.     Heart sounds:  Normal heart sounds. No murmur. No friction rub. No gallop.   Pulmonary:     Effort: Pulmonary effort is normal. No respiratory distress.     Breath sounds: Normal breath sounds. No stridor. No wheezing or rales.  Abdominal:     General: Bowel sounds are normal. There is no distension.     Palpations: Abdomen is soft.     Tenderness: There is no abdominal tenderness. There is no guarding or rebound.  Musculoskeletal:     Comments: Midline and bilateral paraspinal lumbar tenderness Medial L knee tenderness Bilateral hip tenderness  Lymphadenopathy:     Cervical: No cervical adenopathy.  Skin:    General: Skin is warm and dry.     Coloration: Skin is not pale.     Findings: No rash.     Comments: Erythematous rash to bilateral inguinal region (patient reports this is being treated with a fungal powder)  Neurological:     Mental Status: She is alert.     Coordination: Coordination normal.     Comments: CN 3-12 intact; normal sensation throughout; 5/5 strength in all 4 extremities; equal bilateral grip strength      ED Treatments / Results  Labs (all labs ordered are listed, but only abnormal results are displayed) Labs Reviewed  COMPREHENSIVE METABOLIC PANEL - Abnormal; Notable for the following components:  Result Value   CO2 20 (*)    Glucose, Bld 142 (*)    BUN 132 (*)    Creatinine, Ser 6.11 (*)    Calcium 8.6 (*)    Albumin 3.2 (*)    AST 12 (*)    GFR calc non Af Amer 6 (*)    GFR calc Af Amer 8 (*)    All other components within normal limits  CBC WITH DIFFERENTIAL/PLATELET - Abnormal; Notable for the following components:   WBC 10.9 (*)    RBC 3.18 (*)    Hemoglobin 7.3 (*)    HCT 25.8 (*)    MCH 23.0 (*)    MCHC 28.3 (*)    RDW 18.9 (*)    Platelets 458 (*)    Neutro Abs 8.7 (*)    All other components within normal limits  BRAIN NATRIURETIC PEPTIDE - Abnormal; Notable for the following components:   B Natriuretic Peptide 250.5 (*)    All other  components within normal limits  IRON AND TIBC - Abnormal; Notable for the following components:   Iron 19 (*)    Saturation Ratios 6 (*)    All other components within normal limits  POC OCCULT BLOOD, ED - Abnormal; Notable for the following components:   Fecal Occult Bld POSITIVE (*)    All other components within normal limits  TROPONIN I (HIGH SENSITIVITY) - Abnormal; Notable for the following components:   Troponin I (High Sensitivity) 34 (*)    All other components within normal limits  TROPONIN I (HIGH SENSITIVITY) - Abnormal; Notable for the following components:   Troponin I (High Sensitivity) 32 (*)    All other components within normal limits  SARS CORONAVIRUS 2 (HOSPITAL ORDER, [facility])  VITAMIN B12  FERRITIN  TSH  URINALYSIS, ROUTINE W REFLEX MICROSCOPIC  RAPID URINE DRUG SCREEN, HOSP PERFORMED  SODIUM, URINE, RANDOM  CREATININE, URINE, RANDOM  OSMOLALITY, URINE  TYPE AND SCREEN    EKG None  Radiology Ct Abdomen Pelvis Wo Contrast  Result Date: 08/16/2019 CLINICAL DATA:  Fall, leg and sacral pain. EXAM: CT ABDOMEN AND PELVIS WITHOUT CONTRAST CT LUMBAR SPINE WITHOUT CONTRAST TECHNIQUE: Multidetector CT imaging of the abdomen and pelvis was performed following the standard protocol without IV contrast. COMPARISON:  CT abdomen dated 09/18/2016. FINDINGS: Lower chest: No acute findings. Small hiatal hernia. Hepatobiliary: No focal liver abnormality is seen. No gallstones, gallbladder wall thickening, or biliary dilatation. Pancreas: Unremarkable. No pancreatic ductal dilatation or surrounding inflammatory changes. Spleen: Normal in size without focal abnormality. Adrenals/Urinary Tract: Adrenal glands are unremarkable. Kidneys are unremarkable without mass, stone or hydronephrosis. No evidence of renal injury. No perinephric fluid. Stomach/Bowel: No dilated large or small bowel loops. No evidence of bowel wall inflammation or bowel injury.w  stomach is unremarkable, partially decompressed. Vascular/Lymphatic: Aortic atherosclerosis. 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 No enlarged lymph nodes seen in the abdomen or pelvis. Reproductive: Uterus and bilateral adnexa are unremarkable. Other: No free fluid or hemorrhage within the abdomen or pelvis. No free intraperitoneal air. Musculoskeletal: No osseous fracture or dislocation seen. Specifically, fracture within the osseous pelvis or lower lumbar spine. Degenerative spondylosis of the lower lumbar spine, moderate in degree with suspected associated neural foramen encroachments at the L5-S1 level. LUMBAR SPINE CT FINDINGS: Alignment of the lumbar spine is normal. No fracture line or displaced fracture fragment. No compression fracture deformity. Facet joints are normally aligned. Upper sacrum is intact  and normally aligned. Degenerative spondylosis throughout the lumbar spine, mild to moderate in degree with associated disc space narrowings, vacuum disc phenomenon and degenerative facet hypertrophy. Associated osseous neural foramen narrowings at L5-S1 with possible associated nerve root impingement. No acute findings within the immediate paravertebral soft tissues. IMPRESSION: 1. No acute findings within the abdomen or pelvis. 2. No fracture or dislocation within the lumbar spine, sacrum or osseous pelvis. 3. Aortic atherosclerosis. 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 Recommend followup by ultrasound in 3 years. This recommendation follows ACR consensus guidelines: White Paper of the ACR Incidental Findings Committee II on Vascular Findings. J Am Coll Radiol 2013; 10:789-794. Aortic aneurysm NOS (ICD10-I71.9) 4. Small hiatal hernia. 5. Degenerative spondylosis of the lumbar spine, mild to moderate in degree with associated neural foramen encroachments at the L5-S1 level. If symptoms could be radiculopathic in nature, would  consider nonemergent lumbar spine MRI to exclude associated nerve root impingement. Aortic Atherosclerosis (ICD10-I70.0). Electronically Signed   By: Franki Cabot M.D.   On: 08/16/2019 13:21   Dg Chest 2 View  Result Date: 08/16/2019 CLINICAL DATA:  Shortness of breath, chest pain EXAM: CHEST - 2 VIEW COMPARISON:  06/01/2019 FINDINGS: Cardiomegaly with vascular congestion. Prior CABG. No overt edema. No confluent opacities or effusions. No acute bony abnormality. IMPRESSION: Cardiomegaly, vascular congestion. Electronically Signed   By: Rolm Baptise M.D.   On: 08/16/2019 09:59   Dg Lumbar Spine Complete  Result Date: 08/16/2019 CLINICAL DATA:  Lower back and bilateral hip pain after fall. EXAM: LUMBAR SPINE - COMPLETE 4+ VIEW COMPARISON:  None. FINDINGS: No fracture or spondylolisthesis is noted. Mild degenerative disc disease is noted at L2-3 and L5-S1. Atherosclerosis of abdominal aorta is noted. Abdominal aortic aneurysm cannot be excluded. Posterior facet joints are unremarkable. IMPRESSION: Mild multilevel degenerative disc disease. No acute abnormality seen in the lumbar spine. Possible abdominal aortic aneurysm is noted. Ultrasound is recommended for further evaluation. Aortic Atherosclerosis (ICD10-I70.0). Electronically Signed   By: Marijo Conception M.D.   On: 08/16/2019 09:14   Ct L-spine No Charge  Result Date: 08/16/2019 CLINICAL DATA:  Fall, leg and sacral pain. EXAM: CT ABDOMEN AND PELVIS WITHOUT CONTRAST CT LUMBAR SPINE WITHOUT CONTRAST TECHNIQUE: Multidetector CT imaging of the abdomen and pelvis was performed following the standard protocol without IV contrast. COMPARISON:  CT abdomen dated 09/18/2016. FINDINGS: Lower chest: No acute findings. Small hiatal hernia. Hepatobiliary: No focal liver abnormality is seen. No gallstones, gallbladder wall thickening, or biliary dilatation. Pancreas: Unremarkable. No pancreatic ductal dilatation or surrounding inflammatory changes. Spleen: Normal  in size without focal abnormality. Adrenals/Urinary Tract: Adrenal glands are unremarkable. Kidneys are unremarkable without mass, stone or hydronephrosis. No evidence of renal injury. No perinephric fluid. Stomach/Bowel: No dilated large or small bowel loops. No evidence of bowel wall inflammation or bowel injury.w stomach is unremarkable, partially decompressed. Vascular/Lymphatic: Aortic atherosclerosis. 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 No enlarged lymph nodes seen in the abdomen or pelvis. Reproductive: Uterus and bilateral adnexa are unremarkable. Other: No free fluid or hemorrhage within the abdomen or pelvis. No free intraperitoneal air. Musculoskeletal: No osseous fracture or dislocation seen. Specifically, fracture within the osseous pelvis or lower lumbar spine. Degenerative spondylosis of the lower lumbar spine, moderate in degree with suspected associated neural foramen encroachments at the L5-S1 level. LUMBAR SPINE CT FINDINGS: Alignment of the lumbar spine is normal. No fracture line or displaced fracture fragment. No compression fracture deformity. Facet  joints are normally aligned. Upper sacrum is intact and normally aligned. Degenerative spondylosis throughout the lumbar spine, mild to moderate in degree with associated disc space narrowings, vacuum disc phenomenon and degenerative facet hypertrophy. Associated osseous neural foramen narrowings at L5-S1 with possible associated nerve root impingement. No acute findings within the immediate paravertebral soft tissues. IMPRESSION: 1. No acute findings within the abdomen or pelvis. 2. No fracture or dislocation within the lumbar spine, sacrum or osseous pelvis. 3. Aortic atherosclerosis. 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 Recommend followup by ultrasound in 3 years. This recommendation follows ACR consensus guidelines: White Paper of the ACR Incidental Findings  Committee II on Vascular Findings. J Am Coll Radiol 2013; 10:789-794. Aortic aneurysm NOS (ICD10-I71.9) 4. Small hiatal hernia. 5. Degenerative spondylosis of the lumbar spine, mild to moderate in degree with associated neural foramen encroachments at the L5-S1 level. If symptoms could be radiculopathic in nature, would consider nonemergent lumbar spine MRI to exclude associated nerve root impingement. Aortic Atherosclerosis (ICD10-I70.0). Electronically Signed   By: Franki Cabot M.D.   On: 08/16/2019 13:21   Dg Knee Complete 4 Views Left  Result Date: 08/16/2019 CLINICAL DATA:  Fall, chronic left knee pain EXAM: LEFT KNEE - COMPLETE 4+ VIEW COMPARISON:  None. FINDINGS: No evidence of fracture, dislocation, or joint effusion. No evidence of arthropathy or other focal bone abnormality. Soft tissues are unremarkable. IMPRESSION: Negative. Electronically Signed   By: Rolm Baptise M.D.   On: 08/16/2019 10:01   Dg Hips Bilat W Or Wo Pelvis 3-4 Views  Result Date: 08/16/2019 CLINICAL DATA:  Bilateral hip pain after fall. EXAM: DG HIP (WITH OR WITHOUT PELVIS) 3-4V BILAT COMPARISON:  None. FINDINGS: There is no evidence of hip fracture or dislocation. There is no evidence of arthropathy or other focal bone abnormality. IMPRESSION: Negative. Electronically Signed   By: Marijo Conception M.D.   On: 08/16/2019 09:15    Procedures .Critical Care Performed by: Frederica Kuster, PA-C Authorized by: Frederica Kuster, PA-C   Critical care provider statement:    Critical care time (minutes):  45   Critical care was necessary to treat or prevent imminent or life-threatening deterioration of the following conditions:  Circulatory failure and renal failure (GI bleed)   Critical care was time spent personally by me on the following activities:  Discussions with consultants, evaluation of patient's response to treatment, examination of patient, ordering and performing treatments and interventions, ordering and review of  laboratory studies, ordering and review of radiographic studies, pulse oximetry, re-evaluation of patient's condition, obtaining history from patient or surrogate and review of old charts   I assumed direction of critical care for this patient from another provider in my specialty: no     (including critical care time)  Medications Ordered in ED Medications  acetaminophen (TYLENOL) tablet 650 mg (has no administration in time range)    Or  acetaminophen (TYLENOL) suppository 650 mg (has no administration in time range)  amLODipine (NORVASC) tablet 5 mg (has no administration in time range)  allopurinol (ZYLOPRIM) tablet 100 mg (has no administration in time range)  atorvastatin (LIPITOR) tablet 40 mg (has no administration in time range)  buPROPion (WELLBUTRIN SR) 12 hr tablet 150 mg (has no administration in time range)  gabapentin (NEURONTIN) capsule 300 mg (has no administration in time range)  latanoprost (XALATAN) 0.005 % ophthalmic solution 1 drop (has no administration in time range)  brinzolamide (AZOPT) 1 % ophthalmic suspension 1 drop (  has no administration in time range)  mometasone-formoterol (DULERA) 100-5 MCG/ACT inhaler 2 puff (has no administration in time range)  linaclotide (LINZESS) capsule 72 mcg (has no administration in time range)  lactated ringers infusion (has no administration in time range)  pantoprazole (PROTONIX) EC tablet 40 mg (has no administration in time range)  metoprolol tartrate (LOPRESSOR) tablet 12.5 mg (has no administration in time range)  ipratropium-albuterol (DUONEB) 0.5-2.5 (3) MG/3ML nebulizer solution 3 mL (has no administration in time range)  fentaNYL (SUBLIMAZE) injection 25 mcg (25 mcg Intravenous Given 08/16/19 0936)  sodium chloride 0.9 % bolus 500 mL (0 mLs Intravenous Stopped 08/16/19 1238)  pantoprazole (PROTONIX) injection 40 mg (40 mg Intravenous Given 08/16/19 1252)     Initial Impression / Assessment and Plan / ED Course  I have  reviewed the triage vital signs and the nursing notes.  Pertinent labs & imaging results that were available during my care of the patient were reviewed by me and considered in my medical decision making (see chart for details).        Patient presenting with 2 falls in the past 2 days after tripping over her bathroom rug.  However, patient is found to have profound AKI as well as a 2 g drop in her hemoglobin over the past 2 months.  Patient has fecal occult heme positive stool.  Per chart review, patient has been seen by [facility] gastroenterology for possibly bleeding AVM in the past.  Patient is not anticoagulated.  She denies hitting her head or losing consciousness when she fell.  X-ray alluded to a possible AAA.  CT abdomen pelvis ordered to further evaluate this as well as better assess hips and lumbar spine for fracture.  Patient does have a mild aneurysm, infrarenal measuring 3.1 cm.  Radiologist recommends ultrasound follow-up in 3 years.  I discussed patient case with Nancy Marus, PA-C with gastroenterology who will evaluate the patient and provide recommendations.  I discussed patient case with Dr. Shan Levans of internal medicine teaching service who accepts patient for admission.  I appreciate the above consultants for their assistance with the patient.  Final Clinical Impressions(s) / ED Diagnoses   Final diagnoses:  Low back pain  Fall, initial encounter  AKI (acute kidney injury) ([place])  Iron deficiency anemia due to chronic blood loss  Gastrointestinal hemorrhage, unspecified gastrointestinal hemorrhage type    ED Discharge Orders    None       Caryl Ada 08/16/19 1612    Charlesetta Shanks, MD 09/07/19 [address] [place], [place] 09/15/19 1635    Charlesetta Shanks, MD 09/15/19 2348

## 2019-08-16 NOTE — ED Notes (Signed)
To CT with tech

## 2019-08-16 NOTE — ED Triage Notes (Signed)
Pt arrived by EMS post fall. Pt reports tripping over a rug in her bathroom; c/o L leg and sacral pain Denies LOC, not on blood thinners

## 2019-08-16 NOTE — Plan of Care (Signed)
  Problem: Education: Goal: Ability to identify signs and symptoms of gastrointestinal bleeding will improve Outcome: Progressing   Problem: Bowel/Gastric: Goal: Will show no signs and symptoms of gastrointestinal bleeding Outcome: Progressing   Problem: Fluid Volume: Goal: Will show no signs and symptoms of excessive bleeding Outcome: Progressing   Problem: Clinical Measurements: Goal: Complications related to the disease process, condition or treatment will be avoided or minimized Outcome: Progressing   

## 2019-08-16 NOTE — Anesthesia Preprocedure Evaluation (Addendum)
Anesthesia Evaluation  Patient identified by MRN, date of birth, ID band Patient awake    Reviewed: Allergy & Precautions, NPO status , Patient's Chart, lab work & pertinent test results  History of Anesthesia Complications Negative for: history of anesthetic complications  Airway Mallampati: I  TM Distance: >3 FB Neck ROM: Full    Dental  (+) Edentulous Upper, Edentulous Lower, Dental Advisory Given   Pulmonary Current Smoker,    Pulmonary exam normal        Cardiovascular hypertension, Pt. on medications and Pt. on home beta blockers + CAD, + Past MI, + CABG and + Peripheral Vascular Disease  Normal cardiovascular exam     Neuro/Psych CVA, Residual Symptoms    GI/Hepatic Neg liver ROS, hiatal hernia,   Endo/Other  diabetesMorbid obesity  Renal/GU negative Renal ROS     Musculoskeletal negative musculoskeletal ROS (+)   Abdominal   Peds  Hematology negative hematology ROS (+)   Anesthesia Other Findings Day of surgery medications reviewed with the patient.  Reproductive/Obstetrics                            Anesthesia Physical Anesthesia Plan  ASA: III  Anesthesia Plan: MAC   Post-op Pain Management:    Induction:   PONV Risk Score and Plan: 1 and Propofol infusion  Airway Management Planned: Natural Airway  Additional Equipment:   Intra-op Plan:   Post-operative Plan:   Informed Consent: I have reviewed the patients History and Physical, chart, labs and discussed the procedure including the risks, benefits and alternatives for the proposed anesthesia with the patient or authorized representative who has indicated his/her understanding and acceptance.     Dental advisory given  Plan Discussed with: Anesthesiologist  Anesthesia Plan Comments:        Anesthesia Quick Evaluation

## 2019-08-17 ENCOUNTER — Encounter (HOSPITAL_COMMUNITY)
Admission: EM | Disposition: A | Discharge status: Home Health | Payer: Self-pay | Source: Home / Self Care | Attending: Internal Medicine

## 2019-08-17 ENCOUNTER — Inpatient Hospital Stay (HOSPITAL_COMMUNITY): Payer: Medicare Other | Admitting: Anesthesiology

## 2019-08-17 ENCOUNTER — Encounter (HOSPITAL_COMMUNITY): Payer: Self-pay

## 2019-08-17 ENCOUNTER — Other Ambulatory Visit: Payer: Self-pay

## 2019-08-17 DIAGNOSIS — N184 Chronic kidney disease, stage 4 (severe): Secondary | ICD-10-CM

## 2019-08-17 DIAGNOSIS — N179 Acute kidney failure, unspecified: Secondary | ICD-10-CM

## 2019-08-17 DIAGNOSIS — I714 Abdominal aortic aneurysm, without rupture: Secondary | ICD-10-CM

## 2019-08-17 DIAGNOSIS — I2510 Atherosclerotic heart disease of native coronary artery without angina pectoris: Secondary | ICD-10-CM

## 2019-08-17 DIAGNOSIS — D62 Acute posthemorrhagic anemia: Secondary | ICD-10-CM | POA: Diagnosis present

## 2019-08-17 DIAGNOSIS — K3184 Gastroparesis: Secondary | ICD-10-CM

## 2019-08-17 DIAGNOSIS — Z9889 Other specified postprocedural states: Secondary | ICD-10-CM

## 2019-08-17 DIAGNOSIS — I5089 Other heart failure: Secondary | ICD-10-CM

## 2019-08-17 DIAGNOSIS — R195 Other fecal abnormalities: Secondary | ICD-10-CM | POA: Diagnosis present

## 2019-08-17 DIAGNOSIS — E1122 Type 2 diabetes mellitus with diabetic chronic kidney disease: Secondary | ICD-10-CM

## 2019-08-17 DIAGNOSIS — E1143 Type 2 diabetes mellitus with diabetic autonomic (poly)neuropathy: Secondary | ICD-10-CM

## 2019-08-17 DIAGNOSIS — K222 Esophageal obstruction: Secondary | ICD-10-CM

## 2019-08-17 DIAGNOSIS — I130 Hypertensive heart and chronic kidney disease with heart failure and stage 1 through stage 4 chronic kidney disease, or unspecified chronic kidney disease: Secondary | ICD-10-CM

## 2019-08-17 DIAGNOSIS — Z791 Long term (current) use of non-steroidal anti-inflammatories (NSAID): Secondary | ICD-10-CM

## 2019-08-17 DIAGNOSIS — Q2733 Arteriovenous malformation of digestive system vessel: Secondary | ICD-10-CM

## 2019-08-17 DIAGNOSIS — K449 Diaphragmatic hernia without obstruction or gangrene: Secondary | ICD-10-CM

## 2019-08-17 DIAGNOSIS — Z794 Long term (current) use of insulin: Secondary | ICD-10-CM

## 2019-08-17 DIAGNOSIS — Z951 Presence of aortocoronary bypass graft: Secondary | ICD-10-CM

## 2019-08-17 DIAGNOSIS — F339 Major depressive disorder, recurrent, unspecified: Secondary | ICD-10-CM

## 2019-08-17 DIAGNOSIS — Z9181 History of falling: Secondary | ICD-10-CM

## 2019-08-17 DIAGNOSIS — E7800 Pure hypercholesterolemia, unspecified: Secondary | ICD-10-CM

## 2019-08-17 DIAGNOSIS — K922 Gastrointestinal hemorrhage, unspecified: Secondary | ICD-10-CM

## 2019-08-17 DIAGNOSIS — Z79899 Other long term (current) drug therapy: Secondary | ICD-10-CM

## 2019-08-17 DIAGNOSIS — K209 Esophagitis, unspecified: Secondary | ICD-10-CM

## 2019-08-17 DIAGNOSIS — M109 Gout, unspecified: Secondary | ICD-10-CM

## 2019-08-17 HISTORY — DX: Acute posthemorrhagic anemia: D62

## 2019-08-17 HISTORY — PX: ESOPHAGOGASTRODUODENOSCOPY (EGD) WITH PROPOFOL: SHX5813

## 2019-08-17 LAB — CBC
HCT: 24 % — ABNORMAL LOW (ref 36.0–46.0)
Hemoglobin: 7 g/dL — ABNORMAL LOW (ref 12.0–15.0)
MCH: 22.9 pg — ABNORMAL LOW (ref 26.0–34.0)
MCHC: 29.2 g/dL — ABNORMAL LOW (ref 30.0–36.0)
MCV: 78.4 fL — ABNORMAL LOW (ref 80.0–100.0)
Platelets: 453 10*3/uL — ABNORMAL HIGH (ref 150–400)
RBC: 3.06 MIL/uL — ABNORMAL LOW (ref 3.87–5.11)
RDW: 18.9 % — ABNORMAL HIGH (ref 11.5–15.5)
WBC: 9.9 10*3/uL (ref 4.0–10.5)
nRBC: 0.4 % — ABNORMAL HIGH (ref 0.0–0.2)

## 2019-08-17 LAB — COMPREHENSIVE METABOLIC PANEL
ALT: 10 U/L (ref 0–44)
AST: 12 U/L — ABNORMAL LOW (ref 15–41)
Albumin: 3.1 g/dL — ABNORMAL LOW (ref 3.5–5.0)
Alkaline Phosphatase: 115 U/L (ref 38–126)
Anion gap: 12 (ref 5–15)
BUN: 127 mg/dL — ABNORMAL HIGH (ref 8–23)
CO2: 19 mmol/L — ABNORMAL LOW (ref 22–32)
Calcium: 8.4 mg/dL — ABNORMAL LOW (ref 8.9–10.3)
Chloride: 107 mmol/L (ref 98–111)
Creatinine, Ser: 5.89 mg/dL — ABNORMAL HIGH (ref 0.44–1.00)
GFR calc Af Amer: 8 mL/min — ABNORMAL LOW (ref >60)
GFR calc non Af Amer: 7 mL/min — ABNORMAL LOW (ref >60)
Glucose, Bld: 139 mg/dL — ABNORMAL HIGH (ref 70–99)
Potassium: 4.2 mmol/L (ref 3.5–5.1)
Sodium: 138 mmol/L (ref 135–145)
Total Bilirubin: 0.4 mg/dL (ref 0.3–1.2)
Total Protein: 6.6 g/dL (ref 6.5–8.1)

## 2019-08-17 LAB — PREPARE RBC (CROSSMATCH)

## 2019-08-17 LAB — GLUCOSE, CAPILLARY
Glucose-Capillary: 133 mg/dL — ABNORMAL HIGH (ref 70–99)
Glucose-Capillary: 102 mg/dL — ABNORMAL HIGH (ref 70–99)
Glucose-Capillary: 73 mg/dL (ref 70–99)
Glucose-Capillary: 153 mg/dL — ABNORMAL HIGH (ref 70–99)
Glucose-Capillary: 162 mg/dL — ABNORMAL HIGH (ref 70–99)

## 2019-08-17 SURGERY — ESOPHAGOGASTRODUODENOSCOPY (EGD) WITH PROPOFOL
Anesthesia: Monitor Anesthesia Care

## 2019-08-17 MED ORDER — LIDOCAINE HCL (CARDIAC) PF 100 MG/5ML IV SOSY
PREFILLED_SYRINGE | INTRAVENOUS | Status: DC | PRN
  Administered 2019-08-17: 100 mg via INTRAVENOUS

## 2019-08-17 MED ORDER — SODIUM CHLORIDE 0.9% IV SOLUTION
Freq: Once | INTRAVENOUS | Status: AC
  Administered 2019-08-18: 19:00:00 via INTRAVENOUS

## 2019-08-17 MED ORDER — LACTATED RINGERS IV SOLN
INTRAVENOUS | Status: DC
  Administered 2019-08-17: 09:00:00 via INTRAVENOUS

## 2019-08-17 MED ORDER — SODIUM CHLORIDE 0.9 % IV SOLN
510.0000 mg | INTRAVENOUS | Status: DC
  Administered 2019-08-17: 510 mg via INTRAVENOUS
  Filled 2019-08-17: qty 17

## 2019-08-17 MED ORDER — PROPOFOL 10 MG/ML IV BOLUS
INTRAVENOUS | Status: DC | PRN
  Administered 2019-08-17: 30 mg via INTRAVENOUS

## 2019-08-17 MED ORDER — DARBEPOETIN ALFA 100 MCG/0.5ML IJ SOSY
100.0000 ug | PREFILLED_SYRINGE | INTRAMUSCULAR | Status: DC
  Administered 2019-08-18: 100 ug via SUBCUTANEOUS
  Filled 2019-08-17: qty 0.5

## 2019-08-17 MED ORDER — PROPOFOL 500 MG/50ML IV EMUL
INTRAVENOUS | Status: DC | PRN
  Administered 2019-08-17: 100 ug/kg/min via INTRAVENOUS

## 2019-08-17 SURGICAL SUPPLY — 15 items

## 2019-08-17 NOTE — Progress Notes (Signed)
Patient received back from EGD procedure patient is awake and alert and oriented vs stable. Tele re applied to patient upon return to floor CCMD informed.

## 2019-08-17 NOTE — Plan of Care (Signed)
  Problem: Education: Goal: Ability to identify signs and symptoms of gastrointestinal bleeding will improve Outcome: Progressing   Problem: Bowel/Gastric: Goal: Will show no signs and symptoms of gastrointestinal bleeding Outcome: Progressing   Problem: Fluid Volume: Goal: Will show no signs and symptoms of excessive bleeding Outcome: Progressing   Problem: Clinical Measurements: Goal: Complications related to the disease process, condition or treatment will be avoided or minimized Outcome: Progressing   Problem: Education: Goal: Knowledge of General Education information will improve Description: Including pain rating scale, medication(s)/side effects and non-pharmacologic comfort measures Outcome: Progressing   Problem: Health Behavior/Discharge Planning: Goal: Ability to manage health-related needs will improve Outcome: Progressing   Problem: Clinical Measurements: Goal: Ability to maintain clinical measurements within normal limits will improve Outcome: Progressing Goal: Will remain free from infection Outcome: Progressing Goal: Diagnostic test results will improve Outcome: Progressing Goal: Respiratory complications will improve Outcome: Progressing Goal: Cardiovascular complication will be avoided Outcome: Progressing   Problem: Activity: Goal: Risk for activity intolerance will decrease Outcome: Progressing   Problem: Nutrition: Goal: Adequate nutrition will be maintained Outcome: Progressing   Problem: Coping: Goal: Level of anxiety will decrease Outcome: Progressing   Problem: Elimination: Goal: Will not experience complications related to bowel motility Outcome: Progressing Goal: Will not experience complications related to urinary retention Outcome: Progressing   Problem: Pain Managment: Goal: General experience of comfort will improve Outcome: Progressing   Problem: Safety: Goal: Ability to remain free from injury will improve Outcome:  Progressing   Problem: Skin Integrity: Goal: Risk for impaired skin integrity will decrease Outcome: Progressing   Problem: Education: Goal: Ability to demonstrate management of disease process will improve Outcome: Progressing Goal: Ability to verbalize understanding of medication therapies will improve Outcome: Progressing Goal: Individualized Educational Video(s) Outcome: Progressing   Problem: Activity: Goal: Capacity to carry out activities will improve Outcome: Progressing   Problem: Cardiac: Goal: Ability to achieve and maintain adequate cardiopulmonary perfusion will improve Outcome: Progressing

## 2019-08-17 NOTE — Progress Notes (Signed)
Subjective: Patient examined at bedside this morning. She is not in any acute distress and reports no overnight events. Patient's questions regarding endoscopy and expected length of stay answered. Also discussed blood transfusion given drop in hemoglobin, patient agreed.   Objective:  Vital signs in last 24 hours: Vitals:   08/17/19 0741 08/17/19 0910 08/17/19 1039 08/17/19 1050  BP: 133/66 (!) 149/66 (!) 121/52 118/76  Pulse: 68 72 65 65  Resp: 15 13 19  (!) 22  Temp: 98.5 F (36.9 C) 99 F (37.2 C) 97.7 F (36.5 C)   TempSrc: Oral Oral Temporal   SpO2: 96% 99% 100% 100%  Weight:      Height:       Physical Exam  Constitutional: She is oriented to person, place, and time and well-developed, well-nourished, and in no distress.  Neck: No JVD present.  Cardiovascular: Normal rate, regular rhythm, normal heart sounds and intact distal pulses. Exam reveals no gallop and no friction rub.  No murmur heard. Pulmonary/Chest: Effort normal and breath sounds normal. No respiratory distress. She has no wheezes. She has no rales.  Abdominal: Soft. Bowel sounds are normal. She exhibits no distension. There is no abdominal tenderness. There is no guarding.  Musculoskeletal: Normal range of motion.        General: No tenderness, deformity or edema.  Neurological: She is alert and oriented to person, place, and time. No cranial nerve deficit.     Assessment/Plan:  Active Problems:   GI bleed   Esophageal stricture   Diabetic gastroparesis (HCC)   Heme positive stool  Mechanical falls w/o syncope: Patient reports two episodes of mechanical falls where she tripped on the bathroom rug on Sunday and again on Monday. Patient denies any dizziness or lightheadedness prior to these episodes. Patient reports a few episodes of lightheadedness immediately upon standing, requiring her to pause before proceeding to walk. She also denies any head trauma or loss of consciousness. She reports landing on  her knee and back. No signs of musculoskeletal trauma on imaging studies. No falls since being in hospital.   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 Of note, patient has had recent Hb drop of 2g/dl over 2 months period (9.4 >7.3) presenting after two mechanical falls. She reports dark brown stools and was found to be FOBT+ve. Per chart review, patient was supposed to have a colonoscopy in June 2020 but due to transportation issues, unable to do so. Patient had endoscopy this morning with Dr. Henrene Pastor  IMPRESSIONS: 1. Esophageal stricture with mild esophagitis 2. Hiatal hernia without obvious [place] erosions 3. Retained gastric contents consistent with diabetic gastroparesis 4. Otherwise normal exam with no evidence of active bleeding At this point, given renal function would not proceed with colonoscopy unless develop subacute or overt bleeding due to potential prep complications.   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 Baseline cr 3.3-3.4. Patient reports good appetite and PO intake. She reports that her Lasix dose was recently increased by Dr. Marylou Flesher. However she has not been treated with EPO yet. Patient has been given Feraheme in 2017 but has not received any since then. Renal function did not improve with fluid challenge or Foley. Urine studies without significant proteinuria or hematuria. FeNa suggestive of intrarenal etiology. Nephrology consulted.  - Renal function panel -  Strict I/O and daily weights - Avoid nephrotoxic agents  - F/u nephrology recommendations    Iron deficiency anemia: Patient with history of iron deficiency anemia. Prior work up of [place] in 2017 revealed isolated nonbleeding colonic AVM with recommendation for chronic iron therapy. She is taking iron 325mg  tid. Patient was  previously scheduled for elective colonoscopy to assess for prgression of anemia; however unable to complete due to transportation issues. However, in presence of AKI, bowel prep for colonoscopy would worsen her renal function.  Hb 7.0, MCV 78 Iron studies: Fe 19, TIBC 307, Ferritin 14  - Transfuse 1upRBC - IV Feraheme  - CBC   Insulin dependent diabetes mellitus: Patient on Lantus 21U + novolog 3U tid at home. Last HbA1c 6.6 on 03/2019.   - Novolog 3U tid + SSI  - CBG monitoring  Hypertension, HFmrEF: EF on Echo 40-45% on TTE in April 2020.  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 Recommend followup by ultrasound in 3 years. - Continue to monitor  Hypercholesterolemia: Patient on atorvastatin 40mg  qd - Continue home meds  Hx of depression:  Wellbutrin 150mg  bid - Continue home meds  Hx of gout: Allopurinol 100mg  qd - Continue home meds  Diet: Renal diet  Fluids: LR 125 ml/hr Code: Full code DVT Prophylaxis: SCDs  Dispo: Anticipated discharge in approximately 1-2 day(s).   Harvie Heck, MD  Internal Medicine, PGY-1 08/17/2019, 11:15 AM Pager: [phone]

## 2019-08-17 NOTE — Consult Note (Signed)
[name] [name] is an 68 y.o. female.    Chief Complaint: falls  HPI: Pt is a 26F with a PMH significant for CKD IV, HTN, HLD, chronic combined systolic and diastolic CHF, known colonic AVMs, CAD s/p CABG, h/o CVA, DM II who is now seen in consultation at the request of Dr Rebeca Alert for evaluation and recommendations surrounding AKI on CKD.  Briefly, pt experienced 2 mechanical falls the days prior to admission.  She was noted to have hgb 7.3 down from 9.5 in June 2020, BUN 132, Cr of 6.11 (baseline 3.57 05/2019).  FOBT +, underwent EGD today showing esophagitis with stricture.  C-scope not performed d/t concerns over prep and AKI.  In this setting we are asked to see.  Pt currently getting 1 u PRBCs.  No complaints.  Feeling OK although a little sleepy.  No f/c, n/v, SOB, CP.  No lethargy, hiccups, or loss of appetite.  No jerking movements or asterixis.   She last saw Dr. Joelyn Oms in January where she was found to be doing well and without significant complaint.  Was fluid challenged and had Foley placed; Cr down to 5.89 with Bun 127 today.  PMH: Past Medical History:  Diagnosis Date  . Acute respiratory failure with hypoxia and hypercapnia ([place]) 06/01/2019  . Anemia 08/2016  . Angiodysplasia of colon   . Arthritis of left shoulder region 03/23/2013  . Cardiomegaly 05/2019  . Chronic combined systolic and diastolic CHF (congestive heart failure) (HCC)    a. EF 40-45%, mild LVH, mid apicalanteroseptal and apical HK.  . CKD (chronic kidney disease), stage III ([place])   . Cocaine abuse ([place])    crack cocaine heavily until 2008 then sporadic use since then  . Coronary artery disease    a. 06/2012 NSTEMI/CABG x 3 (LIMA->LAD, VG->OM2, VG->LCX);  b. 04/2015 MV: EF<30%, mid ant, apicalanterior, apical infarct;  c. 04/2015 Cath: LM nl, LAD 90p, LCX 60m, OM1 min irregs, RCA mild dzs, LIMA->LAD nl w/ dist LAD dzs, VG->OM2 nl, VG->LCX nl-->Med Rx.  . CVA  (cerebral infarction)    a. right internal capsule stroke in 12/2006  . Diabetes mellitus    diagnosed in 2008  . Essential hypertension   . Glaucoma   . Gout   . HFrEF (heart failure with reduced ejection fraction) ([place])   . Hyperlipidemia   . Hyperparathyroidism, secondary renal ([place])   . Left-sided sensory deficit present   . Obesity, morbid ([place])   . Pneumonia   . Pulmonary edema 05/2019  . PVD (peripheral vascular disease) ([place])    a. 06/2012 ABI's: R - 0.73, L - 0.71.  Marland Kitchen Renal mass, right   . Shortness of breath dyspnea   . Stroke ([place])   . Thyroid nodule    FNA in 6812 showed follicular cells but not definate neoplasm  . Tobacco abuse   . Trichomoniasis    PSH: Past Surgical History:  Procedure Laterality Date  . CARDIAC CATHETERIZATION    . CARDIAC CATHETERIZATION N/A 05/17/2015   Procedure: Left Heart Cath and Cors/Grafts Angiography;  Surgeon: [name], MD;  Location: [facility];  Service: Cardiovascular;  Laterality: N/A;  . COLONOSCOPY WITH PROPOFOL N/A 04/21/2016   Procedure: COLONOSCOPY WITH PROPOFOL;  Surgeon: Irene Shipper, MD;  Location: [facility];  Service: Endoscopy;  Laterality: N/A;  . CORONARY ARTERY BYPASS GRAFT  07/09/2012   Procedure: CORONARY ARTERY BYPASS GRAFTING (CABG);  Surgeon: Tharon Aquas  Kerby Less, MD;  Location: [facility];  Service: Open Heart Surgery;  Laterality: N/A;  . ESOPHAGOGASTRODUODENOSCOPY N/A 04/20/2016   Procedure: ESOPHAGOGASTRODUODENOSCOPY (EGD);  Surgeon: Gatha Mayer, MD;  Location[facility] ENDOSCOPY;  Service: Endoscopy;  Laterality: N/A;  . LEFT HEART CATHETERIZATION WITH CORONARY ANGIOGRAM N/A 06/29/2012   Procedure: LEFT HEART CATHETERIZATION WITH CORONARY ANGIOGRAM;  Surgeon: Peter M Martinique, MD;  Location: [facility] LAB;  Service: Cardiovascular;  Laterality: N/A;  . STERNAL WOUND DEBRIDEMENT  08/17/2012   Procedure: STERNAL WOUND DEBRIDEMENT;  Surgeon: Ivin Poot, MD;  Location: [facility] OR;  Service: Thoracic;  Laterality: N/A;   wound vac application  . STERNAL WOUND DEBRIDEMENT  08/24/2012   Procedure: STERNAL WOUND DEBRIDEMENT;  Surgeon: Ivin Poot, MD;  Location: [facility];  Service: Thoracic;  Laterality: N/A;  . STERNAL WOUND DEBRIDEMENT  09/01/2012   Procedure: STERNAL WOUND DEBRIDEMENT;  Surgeon: Ivin Poot, MD;  Location: [facility];  Service: Thoracic;  Laterality: N/A;  . STERNAL WOUND DEBRIDEMENT  09/20/2012   Procedure: STERNAL WOUND DEBRIDEMENT;  Surgeon: Ivin Poot, MD;  Location: [facility] OR;  Service: Thoracic;  Laterality: N/A;  wound vac change    Past Medical History:  Diagnosis Date  . Acute respiratory failure with hypoxia and hypercapnia ([place]) 06/01/2019  . Anemia 08/2016  . Angiodysplasia of colon   . Arthritis of left shoulder region 03/23/2013  . Cardiomegaly 05/2019  . Chronic combined systolic and diastolic CHF (congestive heart failure) (HCC)    a. EF 40-45%, mild LVH, mid apicalanteroseptal and apical HK.  . CKD (chronic kidney disease), stage III ([place])   . Cocaine abuse ([place])    crack cocaine heavily until 2008 then sporadic use since then  . Coronary artery disease    a. 06/2012 NSTEMI/CABG x 3 (LIMA->LAD, VG->OM2, VG->LCX);  b. 04/2015 MV: EF<30%, mid ant, apicalanterior, apical infarct;  c. 04/2015 Cath: LM nl, LAD 90p, LCX 54m, OM1 min irregs, RCA mild dzs, LIMA->LAD nl w/ dist LAD dzs, VG->OM2 nl, VG->LCX nl-->Med Rx.  . CVA (cerebral infarction)    a. right internal capsule stroke in 12/2006  . Diabetes mellitus    diagnosed in 2008  . Essential hypertension   . Glaucoma   . Gout   . HFrEF (heart failure with reduced ejection fraction) ([place])   . Hyperlipidemia   . Hyperparathyroidism, secondary renal ([place])   . Left-sided sensory deficit present   . Obesity, morbid ([place])   . Pneumonia   . Pulmonary edema 05/2019  . PVD (peripheral vascular disease) ([place])    a. 06/2012 ABI's: R - 0.73, L - 0.71.  Marland Kitchen Renal mass, right   . Shortness of breath dyspnea   . Stroke ([place])   . Thyroid  nodule    FNA in 1324 showed follicular cells but not definate neoplasm  . Tobacco abuse   . Trichomoniasis     Medications:   Scheduled: . sodium chloride   Intravenous Once  . allopurinol  100 mg Oral Daily  . amLODipine  5 mg Oral Daily  . atorvastatin  40 mg Oral q1800  . brinzolamide  1 drop Both Eyes BID  . buPROPion  150 mg Oral BID  . gabapentin  300 mg Oral TID  . insulin aspart  0-15 Units Subcutaneous TID WC  . insulin aspart  0-5 Units Subcutaneous QHS  . insulin aspart  3 Units Subcutaneous TID WC  . latanoprost  1 drop Both Eyes QHS  . [START ON  08/18/2019] linaclotide  72 mcg Oral QAC breakfast  . metoprolol tartrate  12.5 mg Oral BID  . pantoprazole  40 mg Oral Q0600    Medications Prior to Admission  Medication Sig Dispense Refill  . allopurinol (ZYLOPRIM) 100 MG tablet Take 100 mg by mouth 2 (two) times daily.     Marland Kitchen amLODipine (NORVASC) 5 MG tablet Take 1 tablet (5 mg total) by mouth daily. 30 tablet 11  . aspirin EC 325 MG tablet Take 325 mg by mouth daily.    Marland Kitchen atorvastatin (LIPITOR) 40 MG tablet Take 1 tablet (40 mg total) by mouth daily at 6 PM. 30 tablet 0  . brinzolamide (AZOPT) 1 % ophthalmic suspension Place 1 drop into both eyes 2 (two) times a day.     Marland Kitchen buPROPion (WELLBUTRIN SR) 150 MG 12 hr tablet Take 1 tablet (150 mg total) by mouth 2 (two) times daily. 60 tablet 0  . cetirizine (ZYRTEC) 10 MG tablet Take 10 mg by mouth daily.     . ferrous sulfate 325 (65 FE) MG tablet Take 325 mg by mouth 3 (three) times daily with meals.    . furosemide (LASIX) 40 MG tablet Take 1-2 tablets (40-80 mg total) by mouth See admin instructions. Take 2 tablets in the morning and 1 tablet at bedtime (Patient taking differently: Take 40-80 mg by mouth See admin instructions. Take 80 mg in the morning and 40 mg at bedtime) 90 tablet 0  . gabapentin (NEURONTIN) 300 MG capsule Take 1 capsule (300 mg total) by mouth 3 (three) times daily. 30 capsule 0  . glimepiride (AMARYL)  4 MG tablet Take 4 mg by mouth daily.    . Insulin Glargine (LANTUS SOLOSTAR) 100 UNIT/ML Solostar Pen Inject 21 Units into the skin daily at 10 pm.    . ipratropium (ATROVENT HFA) 17 MCG/ACT inhaler Inhale 2 puffs into the lungs every 6 (six) hours as needed for wheezing.    . latanoprost (XALATAN) 0.005 % ophthalmic solution Place 1 drop into both eyes at bedtime.    Marland Kitchen LINZESS 72 MCG capsule Take 72 mcg by mouth daily before breakfast.   3  . metoprolol tartrate (LOPRESSOR) 25 MG tablet Take 25 mg by mouth 2 (two) times daily.    . mometasone-formoterol (DULERA) 100-5 MCG/ACT AERO Inhale 2 puffs into the lungs every 4 (four) hours as needed for wheezing.    . nitroGLYCERIN (NITROSTAT) 0.4 MG SL tablet Place 1 tablet (0.4 mg total) under the tongue every 5 (five) minutes as needed for chest pain. 30 tablet 0  . polyethylene glycol (MIRALAX / GLYCOLAX) 17 g packet Take 17 g by mouth daily as needed for mild constipation.     Marland Kitchen PROAIR HFA 108 (90 Base) MCG/ACT inhaler Inhale 2 puffs into the lungs every 6 (six) hours as needed for wheezing.   3  . WIXELA INHUB 250-50 MCG/DOSE AEPB Inhale 2 puffs into the lungs daily.     . pantoprazole (PROTONIX) 40 MG tablet Take 1 tablet (40 mg total) by mouth daily. (Patient not taking: Reported on 06/15/2019) 30 tablet 0    ALLERGIES:   Allergies  Allergen Reactions  . Naproxen Rash    FAM HX: Family History  Problem Relation Age of Onset  . Diabetes Mother   . Hypertension Mother   . Cancer Mother   . Hyperlipidemia Father   . Hypertension Father   . Kidney disease Father   . Gout Father   . Cerebrovascular Accident  Father   . Other Other        no known family CAD    Social History:   reports that she has been smoking cigarettes. She has a 12.50 pack-year smoking history. She has never used smokeless tobacco. She reports current drug use. Drug: Cocaine. She reports that she does not drink alcohol.  ROS: ROS: all other systems reviewed and  are negative except as per HPI  Blood pressure 131/66, pulse 66, temperature 98.8 F (37.1 C), temperature source Oral, resp. rate 20, height 5\' 8"  (1.727 m), weight 125.3 kg, SpO2 100 %. PHYSICAL EXAM: Physical Exam  GEN NAD, sitting in bed, eyes closed HEENT EOMI PERRL  NECK no JVD PULM clear bilaterally CV RRR soft systolic murmur ABD soft, obese, nontender NABS EXT trace LE edema NEURO AAO x 3 no asterixis SKIN warm and dry MSK no effusions   Results for orders placed or performed during the hospital encounter of 08/16/19 (from the past 48 hour(s))  Comprehensive metabolic panel     Status: Abnormal   Collection Time: 08/16/19  9:34 AM  Result Value Ref Range   Sodium 138 135 - 145 mmol/L   Potassium 4.3 3.5 - 5.1 mmol/L   Chloride 105 98 - 111 mmol/L   CO2 20 (L) 22 - 32 mmol/L   Glucose, Bld 142 (H) 70 - 99 mg/dL   BUN 132 (H) 8 - 23 mg/dL   Creatinine, Ser 6.11 (H) 0.44 - 1.00 mg/dL   Calcium 8.6 (L) 8.9 - 10.3 mg/dL   Total Protein 6.8 6.5 - 8.1 g/dL   Albumin 3.2 (L) 3.5 - 5.0 g/dL   AST 12 (L) 15 - 41 U/L   ALT 11 0 - 44 U/L   Alkaline Phosphatase 109 38 - 126 U/L   Total Bilirubin 0.4 0.3 - 1.2 mg/dL   GFR calc non Af Amer 6 (L) >60 mL/min   GFR calc Af Amer 8 (L) >60 mL/min   Anion gap 13 5 - 15    Comment: Performed at [facility], [address], [place], [place] [address]  CBC with Differential     Status: Abnormal   Collection Time: 08/16/19  9:34 AM  Result Value Ref Range   WBC 10.9 (H) 4.0 - 10.5 K/uL   RBC 3.18 (L) 3.87 - 5.11 MIL/uL   Hemoglobin 7.3 (L) 12.0 - 15.0 g/dL   HCT 25.8 (L) 36.0 - 46.0 %   MCV 81.1 80.0 - 100.0 fL   MCH 23.0 (L) 26.0 - 34.0 pg   MCHC 28.3 (L) 30.0 - 36.0 g/dL   RDW 18.9 (H) 11.5 - 15.5 %   Platelets 458 (H) 150 - 400 K/uL   nRBC 0.2 0.0 - 0.2 %   Neutrophils Relative % 80 %   Neutro Abs 8.7 (H) 1.7 - 7.7 K/uL   Lymphocytes Relative 10 %   Lymphs Abs 1.1 0.7 - 4.0 K/uL   Monocytes Relative 7 %   Monocytes  Absolute 0.8 0.1 - 1.0 K/uL   Eosinophils Relative 2 %   Eosinophils Absolute 0.2 0.0 - 0.5 K/uL   Basophils Relative 0 %   Basophils Absolute 0.0 0.0 - 0.1 K/uL   Immature Granulocytes 1 %   Abs Immature Granulocytes 0.06 0.00 - 0.07 K/uL    Comment: Performed at [facility], [place] [address] [place], [place] [address]  Troponin I (High Sensitivity)     Status: Abnormal   Collection Time:  08/16/19  9:34 AM  Result Value Ref Range   Troponin I (High Sensitivity) 34 (H) <18 ng/L    Comment: (NOTE) Elevated high sensitivity troponin I (hsTnI) values and significant  changes across serial measurements may suggest ACS but many other  chronic and acute conditions are known to elevate hsTnI results.  Refer to the "Links" section for chest pain algorithms and additional  guidance. Performed at [facility], [place] [address] [place], [place] [address]   Brain natriuretic peptide     Status: Abnormal   Collection Time: 08/16/19  9:34 AM  Result Value Ref Range   B Natriuretic Peptide 250.5 (H) 0.0 - 100.0 pg/mL    Comment: Performed at [facility] [address], [place], [place] [address]  Troponin I (High Sensitivity)     Status: Abnormal   Collection Time: 08/16/19 11:27 AM  Result Value Ref Range   Troponin I (High Sensitivity) 32 (H) <18 ng/L    Comment: (NOTE) Elevated high sensitivity troponin I (hsTnI) values and significant  changes across serial measurements may suggest ACS but many other  chronic and acute conditions are known to elevate hsTnI results.  Refer to the "Links" section for chest pain algorithms and additional  guidance. Performed at [facility], [place] [address] [place], [place] [address]   POC occult blood, ED Provider will collect     Status: Abnormal   Collection Time: 08/16/19 11:58 AM  Result Value Ref Range   Fecal Occult Bld POSITIVE (A) NEGATIVE  Type and screen [place]     Status: None (Preliminary  result)   Collection Time: 08/16/19  2:29 PM  Result Value Ref Range   ABO/RH(D) O POS    Antibody Screen NEG    Sample Expiration 08/19/2019,2359    Unit Number [id number]    Blood Component Type RED CELLS,LR    Unit division 00    Status of Unit ISSUED    Transfusion Status OK TO TRANSFUSE    Crossmatch Result      Compatible Performed at [facility], [place] [address] [place], [place] [address]   Vitamin B12     Status: None   Collection Time: 08/16/19  2:30 PM  Result Value Ref Range   Vitamin B-12 305 180 - 914 pg/mL    Comment: (NOTE) This assay is not validated for testing neonatal or myeloproliferative syndrome specimens for Vitamin B12 levels. Performed at [facility], [place] [address] [place], [place] [address]   Iron and TIBC     Status: Abnormal   Collection Time: 08/16/19  2:30 PM  Result Value Ref Range   Iron 19 (L) 28 - 170 ug/dL   TIBC 307 250 - 450 ug/dL   Saturation Ratios 6 (L) 10.4 - 31.8 %   UIBC 288 ug/dL    Comment: Performed at [facility], [place] [address] [place], [place] [address]  Ferritin     Status: None   Collection Time: 08/16/19  2:30 PM  Result Value Ref Range   Ferritin 14 11 - 307 ng/mL    Comment: Performed at [facility], [place] [address], [place], [place] [address]  TSH     Status: None   Collection Time: 08/16/19  2:30 PM  Result Value Ref Range   TSH 1.821 0.350 - 4.500 uIU/mL    Comment: Performed by a 3rd Generation assay with a functional sensitivity of <=  0.01 uIU/mL. Performed at [facility], [place] [address] [place], [place] [address]   SARS Coronavirus 2 [facility] order, Performed in [facility] hospital lab) Nasopharyngeal Nasopharyngeal Swab     Status: None   Collection Time: 08/16/19  2:34 PM   Specimen: Nasopharyngeal Swab  Result Value Ref Range   SARS Coronavirus 2 NEGATIVE NEGATIVE    Comment: (NOTE) If result is NEGATIVE SARS-CoV-2 target nucleic acids are NOT DETECTED. The  SARS-CoV-2 RNA is generally detectable in upper and lower  respiratory specimens during the acute phase of infection. The lowest  concentration of SARS-CoV-2 viral copies this assay can detect is 250  copies / mL. A negative result does not preclude SARS-CoV-2 infection  and should not be used as the sole basis for treatment or other  patient management decisions.  A negative result may occur with  improper specimen collection / handling, submission of specimen other  than nasopharyngeal swab, presence of viral mutation(s) within the  areas targeted by this assay, and inadequate number of viral copies  (<250 copies / mL). A negative result must be combined with clinical  observations, patient history, and epidemiological information. If result is POSITIVE SARS-CoV-2 target nucleic acids are DETECTED. The SARS-CoV-2 RNA is generally detectable in upper and lower  respiratory specimens dur ing the acute phase of infection.  Positive  results are indicative of active infection with SARS-CoV-2.  Clinical  correlation with patient history and other diagnostic information is  necessary to determine patient infection status.  Positive results do  not rule out bacterial infection or co-infection with other viruses. If result is PRESUMPTIVE POSTIVE SARS-CoV-2 nucleic acids MAY BE PRESENT.   A presumptive positive result was obtained on the submitted specimen  and confirmed on repeat testing.  While 2019 novel coronavirus  (SARS-CoV-2) nucleic acids may be present in the submitted sample  additional confirmatory testing may be necessary for epidemiological  and / or clinical management purposes  to differentiate between  SARS-CoV-2 and other Sarbecovirus currently known to infect humans.  If clinically indicated additional testing with an alternate test  methodology [phone]) is advised. The SARS-CoV-2 RNA is generally  detectable in upper and lower respiratory sp ecimens during the acute   phase of infection. The expected result is Negative. Fact Sheet for Patients:  StrictlyIdeas.no Fact Sheet for Healthcare Providers: BankingDealers.co.za This test is not yet approved or cleared by the [place] FDA and has been authorized for detection and/or diagnosis of SARS-CoV-2 by FDA under an Emergency Use Authorization (EUA).  This EUA will remain in effect (meaning this test can be used) for the duration of the COVID-19 declaration under Section 564(b)(1) of the Act, 21 U.S.C. section 360bbb-3(b)(1), unless the authorization is terminated or revoked sooner. Performed at [facility], [place] [address] [place], [place] [address]   Glucose, capillary     Status: Abnormal   Collection Time: 08/16/19  5:25 PM  Result Value Ref Range   Glucose-Capillary 43 (LL) 70 - 99 mg/dL   Comment 1 Notify RN   Glucose, capillary     Status: Abnormal   Collection Time: 08/16/19  5:50 PM  Result Value Ref Range   Glucose-Capillary 47 (L) 70 - 99 mg/dL  Glucose, capillary     Status: Abnormal   Collection Time: 08/16/19  6:24 PM  Result Value Ref Range   Glucose-Capillary 65 (L) 70 - 99 mg/dL  Glucose, capillary     Status: Abnormal   Collection  Time: 08/16/19  7:01 PM  Result Value Ref Range   Glucose-Capillary 107 (H) 70 - 99 mg/dL  Glucose, capillary     Status: Abnormal   Collection Time: 08/16/19  9:17 PM  Result Value Ref Range   Glucose-Capillary 227 (H) 70 - 99 mg/dL  Urinalysis, Routine w reflex microscopic     Status: Abnormal   Collection Time: 08/16/19  9:59 PM  Result Value Ref Range   Color, Urine YELLOW YELLOW   APPearance CLEAR CLEAR   Specific Gravity, Urine 1.010 1.005 - 1.030   pH 5.0 5.0 - 8.0   Glucose, UA NEGATIVE NEGATIVE mg/dL   Hgb urine dipstick NEGATIVE NEGATIVE   Bilirubin Urine NEGATIVE NEGATIVE   Ketones, ur NEGATIVE NEGATIVE mg/dL   Protein, ur NEGATIVE NEGATIVE mg/dL   Nitrite NEGATIVE  NEGATIVE   Leukocytes,Ua MODERATE (A) NEGATIVE   RBC / HPF 0-5 0 - 5 RBC/hpf   WBC, UA 0-5 0 - 5 WBC/hpf   Bacteria, UA MANY (A) NONE SEEN   Squamous Epithelial / LPF 0-5 0 - 5   Hyaline Casts, UA PRESENT     Comment: Performed at [facility], [address], [place], [place] [address]  Urine rapid drug screen (hosp performed)     Status: Abnormal   Collection Time: 08/16/19  9:59 PM  Result Value Ref Range   Opiates NONE DETECTED NONE DETECTED   Cocaine POSITIVE (A) NONE DETECTED   Benzodiazepines NONE DETECTED NONE DETECTED   Amphetamines NONE DETECTED NONE DETECTED   Tetrahydrocannabinol NONE DETECTED NONE DETECTED   Barbiturates NONE DETECTED NONE DETECTED    Comment: (NOTE) DRUG SCREEN FOR MEDICAL PURPOSES ONLY.  IF CONFIRMATION IS NEEDED FOR ANY PURPOSE, NOTIFY LAB WITHIN 5 DAYS. LOWEST DETECTABLE LIMITS FOR URINE DRUG SCREEN Drug Class                     Cutoff (ng/mL) Amphetamine and metabolites    1000 Barbiturate and metabolites    200 Benzodiazepine                 309 Tricyclics and metabolites     300 Opiates and metabolites        300 Cocaine and metabolites        300 THC                            50 Performed at [facility], [place] [address], [place], [place] [address]   Sodium, urine, random     Status: None   Collection Time: 08/16/19  9:59 PM  Result Value Ref Range   Sodium, Ur 33 mmol/L    Comment: Performed at [facility] [address] [place], [place] [address]  Creatinine, urine, random     Status: None   Collection Time: 08/16/19  9:59 PM  Result Value Ref Range   Creatinine, Urine 78.75 mg/dL    Comment: Performed at [facility] [address], [place], [place] [address]  Osmolality, urine     Status: None   Collection Time: 08/16/19  9:59 PM  Result Value Ref Range   Osmolality, Ur 325 300 - 900 mOsm/kg    Comment: Performed at [facility] [address], [place], [place] [address]  Comprehensive  metabolic panel     Status: Abnormal   Collection Time: 08/17/19  4:51 AM  Result Value Ref  Range   Sodium 138 135 - 145 mmol/L   Potassium 4.2 3.5 - 5.1 mmol/L   Chloride 107 98 - 111 mmol/L   CO2 19 (L) 22 - 32 mmol/L   Glucose, Bld 139 (H) 70 - 99 mg/dL   BUN 127 (H) 8 - 23 mg/dL   Creatinine, Ser 5.89 (H) 0.44 - 1.00 mg/dL   Calcium 8.4 (L) 8.9 - 10.3 mg/dL   Total Protein 6.6 6.5 - 8.1 g/dL   Albumin 3.1 (L) 3.5 - 5.0 g/dL   AST 12 (L) 15 - 41 U/L   ALT 10 0 - 44 U/L   Alkaline Phosphatase 115 38 - 126 U/L   Total Bilirubin 0.4 0.3 - 1.2 mg/dL   GFR calc non Af Amer 7 (L) >60 mL/min   GFR calc Af Amer 8 (L) >60 mL/min   Anion gap 12 5 - 15    Comment: Performed at [facility], [place] [address] [place], [place] [address]  CBC     Status: Abnormal   Collection Time: 08/17/19  4:51 AM  Result Value Ref Range   WBC 9.9 4.0 - 10.5 K/uL   RBC 3.06 (L) 3.87 - 5.11 MIL/uL   Hemoglobin 7.0 (L) 12.0 - 15.0 g/dL   HCT 24.0 (L) 36.0 - 46.0 %   MCV 78.4 (L) 80.0 - 100.0 fL   MCH 22.9 (L) 26.0 - 34.0 pg   MCHC 29.2 (L) 30.0 - 36.0 g/dL   RDW 18.9 (H) 11.5 - 15.5 %   Platelets 453 (H) 150 - 400 K/uL   nRBC 0.4 (H) 0.0 - 0.2 %    Comment: Performed at [facility], [place] [address] [place], [place] [address]  Glucose, capillary     Status: Abnormal   Collection Time: 08/17/19  6:05 AM  Result Value Ref Range   Glucose-Capillary 133 (H) 70 - 99 mg/dL  Prepare RBC     Status: None   Collection Time: 08/17/19  8:09 AM  Result Value Ref Range   Order Confirmation      ORDER PROCESSED BY BLOOD BANK Performed at [facility], [place] [address] [place], [place] [address]   Glucose, capillary     Status: Abnormal   Collection Time: 08/17/19  9:15 AM  Result Value Ref Range   Glucose-Capillary 102 (H) 70 - 99 mg/dL  Glucose, capillary     Status: None   Collection Time: 08/17/19 11:45 AM  Result Value Ref Range   Glucose-Capillary 73 70 - 99 mg/dL  Glucose, capillary      Status: Abnormal   Collection Time: 08/17/19  5:08 PM  Result Value Ref Range   Glucose-Capillary 153 (H) 70 - 99 mg/dL    Ct Abdomen Pelvis Wo Contrast  Result Date: 08/16/2019 CLINICAL DATA:  Fall, leg and sacral pain. EXAM: CT ABDOMEN AND PELVIS WITHOUT CONTRAST CT LUMBAR SPINE WITHOUT CONTRAST TECHNIQUE: Multidetector CT imaging of the abdomen and pelvis was performed following the standard protocol without IV contrast. COMPARISON:  CT abdomen dated 09/18/2016. FINDINGS: Lower chest: No acute findings. Small hiatal hernia. Hepatobiliary: No focal liver abnormality is seen. No gallstones, gallbladder wall thickening, or biliary dilatation. Pancreas: Unremarkable. No pancreatic ductal dilatation or surrounding inflammatory changes. Spleen: Normal in size without focal abnormality. Adrenals/Urinary Tract: Adrenal glands are unremarkable. Kidneys are unremarkable without mass, stone or hydronephrosis. No evidence of renal injury. No perinephric fluid. Stomach/Bowel: No dilated large or small bowel loops. No evidence  of bowel wall inflammation or bowel injury.w stomach is unremarkable, partially decompressed. Vascular/Lymphatic: Aortic atherosclerosis. 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 No enlarged lymph nodes seen in the abdomen or pelvis. Reproductive: Uterus and bilateral adnexa are unremarkable. Other: No free fluid or hemorrhage within the abdomen or pelvis. No free intraperitoneal air. Musculoskeletal: No osseous fracture or dislocation seen. Specifically, fracture within the osseous pelvis or lower lumbar spine. Degenerative spondylosis of the lower lumbar spine, moderate in degree with suspected associated neural foramen encroachments at the L5-S1 level. LUMBAR SPINE CT FINDINGS: Alignment of the lumbar spine is normal. No fracture line or displaced fracture fragment. No compression fracture deformity. Facet joints are normally aligned. Upper sacrum is  intact and normally aligned. Degenerative spondylosis throughout the lumbar spine, mild to moderate in degree with associated disc space narrowings, vacuum disc phenomenon and degenerative facet hypertrophy. Associated osseous neural foramen narrowings at L5-S1 with possible associated nerve root impingement. No acute findings within the immediate paravertebral soft tissues. IMPRESSION: 1. No acute findings within the abdomen or pelvis. 2. No fracture or dislocation within the lumbar spine, sacrum or osseous pelvis. 3. Aortic atherosclerosis. 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 Recommend followup by ultrasound in 3 years. This recommendation follows ACR consensus guidelines: White Paper of the ACR Incidental Findings Committee II on Vascular Findings. J Am Coll Radiol 2013; 10:789-794. Aortic aneurysm NOS (ICD10-I71.9) 4. Small hiatal hernia. 5. Degenerative spondylosis of the lumbar spine, mild to moderate in degree with associated neural foramen encroachments at the L5-S1 level. If symptoms could be radiculopathic in nature, would consider nonemergent lumbar spine MRI to exclude associated nerve root impingement. Aortic Atherosclerosis (ICD10-I70.0). Electronically Signed   By: Franki Cabot M.D.   On: 08/16/2019 13:21   Dg Chest 2 View  Result Date: 08/16/2019 CLINICAL DATA:  Shortness of breath, chest pain EXAM: CHEST - 2 VIEW COMPARISON:  06/01/2019 FINDINGS: Cardiomegaly with vascular congestion. Prior CABG. No overt edema. No confluent opacities or effusions. No acute bony abnormality. IMPRESSION: Cardiomegaly, vascular congestion. Electronically Signed   By: Rolm Baptise M.D.   On: 08/16/2019 09:59   Dg Lumbar Spine Complete  Result Date: 08/16/2019 CLINICAL DATA:  Lower back and bilateral hip pain after fall. EXAM: LUMBAR SPINE - COMPLETE 4+ VIEW COMPARISON:  None. FINDINGS: No fracture or spondylolisthesis is noted. Mild degenerative disc disease is noted at  L2-3 and L5-S1. Atherosclerosis of abdominal aorta is noted. Abdominal aortic aneurysm cannot be excluded. Posterior facet joints are unremarkable. IMPRESSION: Mild multilevel degenerative disc disease. No acute abnormality seen in the lumbar spine. Possible abdominal aortic aneurysm is noted. Ultrasound is recommended for further evaluation. Aortic Atherosclerosis (ICD10-I70.0). Electronically Signed   By: Marijo Conception M.D.   On: 08/16/2019 09:14   Ct L-spine No Charge  Result Date: 08/16/2019 CLINICAL DATA:  Fall, leg and sacral pain. EXAM: CT ABDOMEN AND PELVIS WITHOUT CONTRAST CT LUMBAR SPINE WITHOUT CONTRAST TECHNIQUE: Multidetector CT imaging of the abdomen and pelvis was performed following the standard protocol without IV contrast. COMPARISON:  CT abdomen dated 09/18/2016. FINDINGS: Lower chest: No acute findings. Small hiatal hernia. Hepatobiliary: No focal liver abnormality is seen. No gallstones, gallbladder wall thickening, or biliary dilatation. Pancreas: Unremarkable. No pancreatic ductal dilatation or surrounding inflammatory changes. Spleen: Normal in size without focal abnormality. Adrenals/Urinary Tract: Adrenal glands are unremarkable. Kidneys are unremarkable without mass, stone or hydronephrosis. No evidence of renal injury. No perinephric fluid. Stomach/Bowel: No  dilated large or small bowel loops. No evidence of bowel wall inflammation or bowel injury.w stomach is unremarkable, partially decompressed. Vascular/Lymphatic: Aortic atherosclerosis. 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 No enlarged lymph nodes seen in the abdomen or pelvis. Reproductive: Uterus and bilateral adnexa are unremarkable. Other: No free fluid or hemorrhage within the abdomen or pelvis. No free intraperitoneal air. Musculoskeletal: No osseous fracture or dislocation seen. Specifically, fracture within the osseous pelvis or lower lumbar spine. Degenerative spondylosis of the  lower lumbar spine, moderate in degree with suspected associated neural foramen encroachments at the L5-S1 level. LUMBAR SPINE CT FINDINGS: Alignment of the lumbar spine is normal. No fracture line or displaced fracture fragment. No compression fracture deformity. Facet joints are normally aligned. Upper sacrum is intact and normally aligned. Degenerative spondylosis throughout the lumbar spine, mild to moderate in degree with associated disc space narrowings, vacuum disc phenomenon and degenerative facet hypertrophy. Associated osseous neural foramen narrowings at L5-S1 with possible associated nerve root impingement. No acute findings within the immediate paravertebral soft tissues. IMPRESSION: 1. No acute findings within the abdomen or pelvis. 2. No fracture or dislocation within the lumbar spine, sacrum or osseous pelvis. 3. Aortic atherosclerosis. 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 Recommend followup by ultrasound in 3 years. This recommendation follows ACR consensus guidelines: White Paper of the ACR Incidental Findings Committee II on Vascular Findings. J Am Coll Radiol 2013; 10:789-794. Aortic aneurysm NOS (ICD10-I71.9) 4. Small hiatal hernia. 5. Degenerative spondylosis of the lumbar spine, mild to moderate in degree with associated neural foramen encroachments at the L5-S1 level. If symptoms could be radiculopathic in nature, would consider nonemergent lumbar spine MRI to exclude associated nerve root impingement. Aortic Atherosclerosis (ICD10-I70.0). Electronically Signed   By: Franki Cabot M.D.   On: 08/16/2019 13:21   Dg Knee Complete 4 Views Left  Result Date: 08/16/2019 CLINICAL DATA:  Fall, chronic left knee pain EXAM: LEFT KNEE - COMPLETE 4+ VIEW COMPARISON:  None. FINDINGS: No evidence of fracture, dislocation, or joint effusion. No evidence of arthropathy or other focal bone abnormality. Soft tissues are unremarkable. IMPRESSION: Negative.  Electronically Signed   By: Rolm Baptise M.D.   On: 08/16/2019 10:01   Dg Hips Bilat W Or Wo Pelvis 3-4 Views  Result Date: 08/16/2019 CLINICAL DATA:  Bilateral hip pain after fall. EXAM: DG HIP (WITH OR WITHOUT PELVIS) 3-4V BILAT COMPARISON:  None. FINDINGS: There is no evidence of hip fracture or dislocation. There is no evidence of arthropathy or other focal bone abnormality. IMPRESSION: Negative. Electronically Signed   By: Marijo Conception M.D.   On: 08/16/2019 09:15    Assessment/Plan 1.  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  She is azotemic (as expected in GIB) but not uremic at present.  No indications to start dialysis yet but we will follow closely.  D/w pt that she may end up on dialysis sooner rather than later if things don't turn around with correcting hemodynamics and volume expanding with pRBCs.  2.  Anemia: blood loss with likely anemia of chronic disease mixed in.  Getting feraheme, pRBCs. Also will order Aranesp as well.  3.  HTN: on metop and amlodipine  4.  DM: per primary  5.  Dispo: pending   Madelon Lips 08/17/2019, 7:11 PM

## 2019-08-17 NOTE — Progress Notes (Signed)
  Date: 08/17/2019  Patient name: [name] [name]  Medical record number: [id number]  Date of birth: 02/17/1951   I have seen and evaluated this patient and I have discussed the plan of care with the house staff. Please see their note for complete details. I concur with their findings with the following additions/corrections:   68 yo woman with a history of CAD with prior CABG, HFmrEF, DM, CKD stage IV admitted after falling twice in the bathroom.  She denies any loss of consciousness with these falls.  Work-up on admission revealed severe AKI and acute on chronic anemia.  1.  Acute blood loss anemia on chronic anemia: Baseline Hgb 9-11, was 7.3 on presentation and down to 7.0 this morning.  Ferritin 14, iron 19, T sat 6%, all consistent with iron deficiency, I suspect she has had gradual progression of blood loss anemia over time.  Known history of colonic AVMs, also some NSAID use, appreciate GI consultation. -EGD today negative for acute bleeding -GI recommends holding off on colonoscopy for now unless she has more profuse bleeding -Transfusing 1 unit PRBCs today for Hgb 7.0 with symptoms and history of CABG -We will also give IV iron as her iron deficit is likely in the range of 1.4 to 2.5 g -We will trend CBC to determine need for further transfusion or more urgent evaluation  2.  Acute kidney injury on CKD stage IV: Unclear etiology, FENa 1.8% suggests possible intrinsic etiology.  Could be some component of hypoperfusion due to her acute blood loss anemia.  Initial CT A/P showed large bladder, volume estimated at greater than 1 L so Foley placed.  We have also given her IV fluids, total 2.5 L.  Unfortunately, minimal response thus far.  U OP yesterday 1 L, none recorded thus far today.  Urinalysis does not show any proteinuria, WBCs, RBCs, or any other significant findings.  Fortunately, electrolytes are okay for now and no urgent need for dialysis - She follows with [facility], will  consult them for additional recommendations -Continue strict I's/O, Foley  Lenice Pressman, M.D., Ph.D. 08/17/2019, 4:27 PM

## 2019-08-17 NOTE — Interval H&P Note (Signed)
History and Physical Interval Note:  08/17/2019 10:14 AM  [name] [name]  has presented today for surgery, with the diagnosis of Anemia.  FOBT positive..  The various methods of treatment have been discussed with the patient and family. After consideration of risks, benefits and other options for treatment, the patient has consented to  Procedure(s): ESOPHAGOGASTRODUODENOSCOPY (EGD) WITH PROPOFOL (N/A) as a surgical intervention.  The patient's history has been reviewed, patient examined, no change in status, stable for surgery.  I have reviewed the patient's chart and labs.  Questions were answered to the patient's satisfaction.     [name]

## 2019-08-17 NOTE — Transfer of Care (Signed)
Immediate Anesthesia Transfer of Care Note  Patient: [name] [name]  Procedure(s) Performed: ESOPHAGOGASTRODUODENOSCOPY (EGD) WITH PROPOFOL (N/A )  Patient Location: Endoscopy Unit  Anesthesia Type:MAC  Level of Consciousness: drowsy  Airway & Oxygen Therapy: Patient Spontanous Breathing and Patient connected to nasal cannula oxygen  Post-op Assessment: Report given to RN, Post -op Vital signs reviewed and stable and Patient moving all extremities  Post vital signs: Reviewed and stable  Last Vitals:  Vitals Value Taken Time  BP    Temp    Pulse 64 08/17/19 1039  Resp 19 08/17/19 1039  SpO2 100 % 08/17/19 1039  Vitals shown include unvalidated device data.  Last Pain:  Vitals:   08/17/19 0910  TempSrc: Oral  PainSc: 0-No pain      Patients Stated Pain Goal: 0 (78/93/81 0175)  Complications: No apparent anesthesia complications

## 2019-08-17 NOTE — Telephone Encounter (Signed)
Patient now admitted and getting GI eval

## 2019-08-17 NOTE — Plan of Care (Signed)
  Problem: Bowel/Gastric: Goal: Will show no signs and symptoms of gastrointestinal bleeding Outcome: Progressing   

## 2019-08-17 NOTE — Op Note (Signed)
[facility] Patient Name: [name] [name] Procedure Date : 08/17/2019 MRN: [id number] Attending MD: Docia Chuck. Henrene Pastor , MD Date of Birth: 09-19-1951 CSN: [id number] Age: 68 Admit Type: Inpatient Procedure:                Upper GI endoscopy Indications:              Borderline iron deficiency anemia with drifting                            hemoglobin, Heme positive stool. Has been on                            NSAIDs. Work-up for similar problems in 2017 with                            unremarkable EGD and nonbleeding AVM on                            colonoscopy. The patient has had progressive renal                            failure with current creatinine 6.11 and BUN 132.                            She is to receive 1 unit of packed red blood cell                            for hemoglobin of 7.0. Now for upper endoscopy Providers:                Docia Chuck. Henrene Pastor, MD, Glori Bickers, RN, Cherylynn Ridges,                            Technician, Raphael Gibney, CRNA Referring MD:             [facility] hospital[facility] Medicines:                Monitored Anesthesia Care Complications:            No immediate complications. Estimated Blood Loss:     Estimated blood loss: none. Procedure:                Pre-Anesthesia Assessment:                           - Prior to the procedure, a History and Physical                            was performed, and patient medications and                            allergies were reviewed. The patient's tolerance of                            previous anesthesia was also reviewed. The risks  and benefits of the procedure and the sedation                            options and risks were discussed with the patient.                            All questions were answered, and informed consent                            was obtained. Prior Anticoagulants: The patient has                            taken no previous anticoagulant or  antiplatelet                            agents. ASA Grade Assessment: III - A patient with                            severe systemic disease. After reviewing the risks                            and benefits, the patient was deemed in                            satisfactory condition to undergo the procedure.                           After obtaining informed consent, the endoscope was                            passed under direct vision. Throughout the                            procedure, the patient's blood pressure, pulse, and                            oxygen saturations were monitored continuously. The                            GIF-H190 ([id number]) Olympus gastroscope was                            introduced through the mouth, and advanced to the                            second part of duodenum. The upper GI endoscopy was                            accomplished without difficulty. The patient                            tolerated the procedure well. Scope In: Scope Out: Findings:      The esophagus revealed a large caliber ring with mild  esophagitis.      The stomach a moderate sliding hiatal hernia and large volume of       retained gastric contents obscuring the gastric mucosa.      The examined duodenum was normal.      The cardia and gastric fundus were normal on retroflexion (with       limitations due to retained gastric contents). Impression:               1. Esophageal stricture with mild esophagitis                           2. Hiatal hernia without obvious Cameron erosions                           3. 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 Otherwise normal exam with no evidence of active                            bleeding                           5. Multifactorial anemia                           6. History of isolated nonbleeding AVM on prior                            colonoscopy                            7. Progressive renal failure with current BUN 132                            and creatinine 6.11 Recommendation:           1. Renal evaluation regarding her progressive renal                            failure                           2. Patient may benefit from [facility] and [facility]                            therapies. This could be under the guidance of her                            PCP or nephrology                           3. Continue PPI  4. If the patient did not respond to the above                            named therapies and #2 above OR developed evidence                            of subacute or overt bleeding (only occult at this                            point), then colonoscopy could be considered. I                            would not proceed with elective colonoscopy at this                            time given potential prep complications in the face                            of her advanced kidney disease.                           5. Diet as tolerated. Okay to go home from GI                            perspective (after receiving transfusion). We are                            available for questions. We will sign off.                           . Procedure Code(s):        --- Professional ---                           [id number], Esophagogastroduodenoscopy, flexible,                            transoral; diagnostic, including collection of                            specimen(s) by brushing or washing, when performed                            (separate procedure) Diagnosis Code(s):        --- Professional ---                           D50.9, Iron deficiency anemia, unspecified                           R19.5, Other fecal abnormalities CPT copyright 2019 American Medical Association. All rights reserved. The codes documented in this report are preliminary and upon coder review may  be revised to meet current compliance  requirements. Docia Chuck. Henrene Pastor, MD 08/17/2019 10:46:12 AM This report has been signed electronically.  Number of Addenda: 0 

## 2019-08-17 NOTE — Anesthesia Postprocedure Evaluation (Signed)
Anesthesia Post Note  Patient: [name] [name]  Procedure(s) Performed: ESOPHAGOGASTRODUODENOSCOPY (EGD) WITH PROPOFOL (N/A )     Patient location during evaluation: PACU Anesthesia Type: MAC Level of consciousness: awake and alert Pain management: pain level controlled Vital Signs Assessment: post-procedure vital signs reviewed and stable Respiratory status: spontaneous breathing and respiratory function stable Cardiovascular status: stable Postop Assessment: no apparent nausea or vomiting Anesthetic complications: no    Last Vitals:  Vitals:   08/17/19 1050 08/17/19 1116  BP: 118/76 108/85  Pulse: 65 73  Resp: (!) 22 18  Temp:  36.9 C  SpO2: 100% 100%    Last Pain:  Vitals:   08/17/19 1116  TempSrc: Oral  PainSc:                  [name] [name]

## 2019-08-18 LAB — COMPREHENSIVE METABOLIC PANEL
ALT: 9 U/L (ref 0–44)
AST: 12 U/L — ABNORMAL LOW (ref 15–41)
Albumin: 2.8 g/dL — ABNORMAL LOW (ref 3.5–5.0)
Alkaline Phosphatase: 103 U/L (ref 38–126)
Anion gap: 12 (ref 5–15)
BUN: 124 mg/dL — ABNORMAL HIGH (ref 8–23)
CO2: 17 mmol/L — ABNORMAL LOW (ref 22–32)
Calcium: 8.2 mg/dL — ABNORMAL LOW (ref 8.9–10.3)
Chloride: 110 mmol/L (ref 98–111)
Creatinine, Ser: 6.22 mg/dL — ABNORMAL HIGH (ref 0.44–1.00)
GFR calc Af Amer: 7 mL/min — ABNORMAL LOW (ref >60)
GFR calc non Af Amer: 6 mL/min — ABNORMAL LOW (ref >60)
Glucose, Bld: 176 mg/dL — ABNORMAL HIGH (ref 70–99)
Potassium: 4.5 mmol/L (ref 3.5–5.1)
Sodium: 139 mmol/L (ref 135–145)
Total Bilirubin: 0.7 mg/dL (ref 0.3–1.2)
Total Protein: 5.7 g/dL — ABNORMAL LOW (ref 6.5–8.1)

## 2019-08-18 LAB — GLUCOSE, CAPILLARY
Glucose-Capillary: 106 mg/dL — ABNORMAL HIGH (ref 70–99)
Glucose-Capillary: 131 mg/dL — ABNORMAL HIGH (ref 70–99)
Glucose-Capillary: 82 mg/dL (ref 70–99)
Glucose-Capillary: 106 mg/dL — ABNORMAL HIGH (ref 70–99)

## 2019-08-18 LAB — HEMOGLOBIN AND HEMATOCRIT, BLOOD
HCT: 25.1 % — ABNORMAL LOW (ref 36.0–46.0)
HCT: 27.3 % — ABNORMAL LOW (ref 36.0–46.0)
Hemoglobin: 7.4 g/dL — ABNORMAL LOW (ref 12.0–15.0)
Hemoglobin: 8 g/dL — ABNORMAL LOW (ref 12.0–15.0)

## 2019-08-18 LAB — CBC
HCT: 26.9 % — ABNORMAL LOW (ref 36.0–46.0)
Hemoglobin: 8 g/dL — ABNORMAL LOW (ref 12.0–15.0)
MCH: 24.3 pg — ABNORMAL LOW (ref 26.0–34.0)
MCHC: 29.7 g/dL — ABNORMAL LOW (ref 30.0–36.0)
MCV: 81.8 fL (ref 80.0–100.0)
Platelets: 387 10*3/uL (ref 150–400)
RBC: 3.29 MIL/uL — ABNORMAL LOW (ref 3.87–5.11)
RDW: 18.7 % — ABNORMAL HIGH (ref 11.5–15.5)
WBC: 9.6 10*3/uL (ref 4.0–10.5)
nRBC: 0.5 % — ABNORMAL HIGH (ref 0.0–0.2)

## 2019-08-18 LAB — PREPARE RBC (CROSSMATCH)

## 2019-08-18 LAB — HEMOGLOBIN A1C
Hgb A1c MFr Bld: 5.2 % (ref 4.8–5.6)
Mean Plasma Glucose: 103 mg/dL

## 2019-08-18 MED ORDER — SODIUM CHLORIDE 0.9 % IV SOLN
INTRAVENOUS | Status: AC
  Administered 2019-08-18: 13:00:00 via INTRAVENOUS

## 2019-08-18 MED ORDER — SODIUM CHLORIDE 0.9% IV SOLUTION
Freq: Once | INTRAVENOUS | Status: AC
  Administered 2019-08-18: 06:00:00 via INTRAVENOUS

## 2019-08-18 MED ORDER — SODIUM BICARBONATE 650 MG PO TABS
650.0000 mg | ORAL_TABLET | Freq: Two times a day (BID) | ORAL | Status: DC
  Administered 2019-08-18 (×2): 650 mg via ORAL
  Filled 2019-08-18 (×2): qty 1

## 2019-08-18 NOTE — Progress Notes (Signed)
Attempted Foley placement, pt was uncomfortable and refused further placement. Explained to pt the reason for a foley but would like to try to urinate herself at this point.

## 2019-08-18 NOTE — Progress Notes (Signed)
  Mobility Specialist Criteria Algorithm Info.  SATURATION QUALIFICATIONS: (This note is used to comply with regulatory documentation for home oxygen)  Patient Saturations on Room Air at Rest = 100%  Patient Saturations on Room Air while Ambulating = 100%  Patient Saturations on n/a Liters of oxygen while Ambulating = n/a%  Please briefly explain why patient needs home oxygen:  Mobility:   Activity: Ambulated in hall Range of motion: Active;All extremities Level of assistance: Standby assist, set-up cues, supervision of patient - no hands on Assistive device: BSC;Front wheel walker Minutes stood: 5 minutes Minutes ambulated: 5 minutes Distance ambulated (ft): 30 ft Mobility response: Tolerated well Bed Position: Semi-fowlers Assistive Device: BSC;Front wheel walker Level of Assistance: Standby assist, set-up cues, supervision of patient - no hands on Fall History: 5   08/18/2019 12:30 PM

## 2019-08-18 NOTE — Progress Notes (Signed)
[facility] Progress Note  Name: [name] [name] MRN: [id number] DOB: Jul 08, 1951  Chief Complaint:  Falls at home   Subjective:  Received PRBC's.  Seen in consult yesterday.  400 mL UOP recorded for 8/19 and 1 liter the day prior as charted.  She states that she has been urinating without difficulty.  Earlier this week, food tasted like metal but this is better now.  She hasn't had any overt blood per rectum but she has had darker stools which she had attributed to starting iron in June.  She states foley was placed earlier this admission but has been removed.  We discussed NPO after midnight tonight in the event dialysis is needed tomorrow   Review of systems:  Appetite is ok  Denies n/v Denies shortness of breath or chest pain  ----------- Background on consult:  [name] [name] is a 60F with a PMH significant for CKD IV, HTN, HLD, chronic combined systolic and diastolic CHF, known colonic AVMs, CAD s/p CABG, h/o CVA, DM II who is now seen in consultation at the request of Dr Rebeca Alert for evaluation and recommendations surrounding AKI on CKD.  Briefly, pt experienced 2 mechanical falls the days prior to admission.  She was noted to have hgb 7.3 down from 9.5 in June 2020, BUN 132, Cr of 6.11 (baseline 3.57 05/2019).  FOBT +, underwent EGD today showing esophagitis with stricture.  C-scope not performed d/t concerns over prep and AKI.  In this setting we are asked to see.  Pt currently getting 1 u PRBCs.  No complaints.  Feeling OK although a little sleepy.  No f/c, n/v, SOB, CP.  No lethargy, hiccups, or loss of appetite.  No jerking movements or asterixis.  She last saw Dr. Joelyn Oms in January where she was found to be doing well and without significant complaint.  Was fluid challenged and had Foley placed; Cr down to 5.89 with Bun 127 on consult    Intake/Output Summary (Last 24 hours) at 08/18/2019 1059 Last data filed at 08/18/2019 0900 Gross per 24 hour  Intake 1135 ml  Output 400  ml  Net 735 ml    Vitals:  Vitals:   08/18/19 0526 08/18/19 0545 08/18/19 0802 08/18/19 0830  BP: (!) 138/43 131/67 (!) 126/53 (!) 128/58  Pulse: 79 71 70 71  Resp: _0 Temp: 100.1 F (37.8 C) 99.2 F (37.3 C) 99.4 F (37.4 C) 99.4 F (37.4 C)  TempSrc: Oral Oral Oral Oral  SpO2:   96% 97%  Weight:      Height:         Physical Exam:  General adult female in bed in no acute distress HEENT normocephalic atraumatic extraocular movements intact sclera anicteric Neck supple trachea midline Lungs clear to auscultation bilaterally normal work of breathing at rest ; room air  Heart regular rate and rhythm no rubs or gallops appreciated Abdomen soft nontender nondistended Extremities no edema  Psych normal mood and affect Neuro - alert and oriented x 3 conversant  GU - no foley   Medications reviewed   Labs:  BMP Latest Ref Rng & Units 08/18/2019 08/17/2019 08/16/2019  Glucose 70 - 99 mg/dL 176(H) 139(H) 142(H)  BUN 8 - 23 mg/dL 124(H) 127(H) 132(H)  Creatinine 0.44 - 1.00 mg/dL 6.22(H) 5.89(H) 6.11(H)  Sodium 135 - 145 mmol/L 139 138 138  Potassium 3.5 - 5.1 mmol/L 4.5 4.2 4.3  Chloride 98 - 111 mmol/L 110 107 105  CO2 22 -  32 mmol/L 17(L) 19(L) 20(L)  Calcium 8.9 - 10.3 mg/dL 8.2(L) 8.4(L) 8.6(L)    Assessment/Plan:   1.  AKI on CKD IV/V: pt with baseline Cr 3.57 with eGFR 14 in 05/2019.  Increased to 6.11-->5.89 in the setting of GI bleed, possibly from colonic AVM.  She is azotemic (as expected in GIB) but not uremic at present.   - Following closely to assess for need for initiating dialysis; no acute indication.  Note baseline GFR 14 most recently in June  - Supportive care including correcting hemodynamics and volume expanding with pRBCs - Will order gentle IV fluids - normal saline limited duration and reassess  - changed to renal diet  - Will make pt NPO after midnight tonight in the event tunneled access for HD is needed on 8/21 - bladder scan to r/o  component of retention (needs foley if retention given her AKI)  - strict ins/outs  2.  Anemia: blood loss with likely anemia of chronic disease mixed in.  On feraheme, pRBCs.  On ESA (added this admission).  Anemia improved with PRBC's  3.  HTN: on metop and amlodipine  4.  DM: per primary team  5.  Hx Falls at home -noted.  Azotemia without overt uremia but following closely to determine if contribution; other work-up per primary team   6. Metabolic acidosis - start sodium bicarbonate   7. Aortic atherosclerosis. 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  Per radiology recommend followup by ultrasound in 3 years.  [name], MD 08/18/2019 10:59 AM

## 2019-08-18 NOTE — Progress Notes (Signed)
  Date: 08/18/2019  Patient name: [name] [name]  Medical record number: [id number]  Date of birth: 02-Feb-1951   I have seen and evaluated this patient and I have discussed the plan of care with the house staff. Please see their note for complete details. I concur with their findings with the following additions/corrections:   68 year old woman with CAD with prior CABG, HFmrEF, DM, CKD stage IV admitted with acute kidney injury on CKD stage IV and acute blood loss anemia in the context of chronic anemia.  1.  Acute blood loss anemia on chronic anemia: EGD yesterday negative for any acute cause of bleeding.  Mediocre response to transfusion, was still below goal Hgb of 8.0 given her cardiac history, so was transfused another 1 unit PRBCs overnight.  Repeat Hgb this morning 8.0.  Anemia likely multifactorial with anemia of chronic disease contributing, but clearly iron deficient, so also gave IV iron. -Given inadequate response to transfusion, she may have ongoing bleeding, will track CBC closely -No plan for urgent colonoscopy unless she continues to show evidence of bleeding  2.  AKI on CKD stage IV: Appreciate nephrology consultation, recommending continued gentle IV fluid challenge.  Agree no urgent need for dialysis, but they are concerned she may progress to that point soon.  U OP yesterday only 400 cc, will continue to follow closely.  We will continue to ensure adequate renal perfusion by treating her acute on chronic anemia as above. -Start bicarb supplementation for metabolic acidosis related to her severely impaired renal function -N.p.o. after midnight in case she needs access for dialysis tomorrow -Check a PVR to ensure adequate bladder emptying has had >1000 cc on admission  3.  Hypertension: -Continue amlodipine and metoprolol for now, but low threshold to hold these medications if she develops hypotension as renal perfusion should be prioritized  Lenice Pressman, M.D., Ph.D.  08/18/2019, 2:16 PM

## 2019-08-18 NOTE — Progress Notes (Addendum)
This 68 y/o female presents with the above. PTA pt reports independence with ADL and functional mobility, reports intermittent use of SPC. Pt presenting with generalized weakness, decreased activity tolerance, impaired balance. Pt tolerating room level mobility using RW, making x2 laps with seated rest in between. During first bout of mobility pt with increased fatigue, difficulty maintaining upright posture and noted LE weakness/mild buckling. After seated rest pt with improved mobility during second trial; overall requires minA (+2 safety) during mobility tasks using RW. Pt currently requires minA for UB ADL, at least modA for LB ADL. Of note pt also lives on second floor apartment with 13 steps to get into her home. She will benefit from continued acute OT services, given her current deficits recommend follow up therapy services in SNF setting to progress pt towards PLOF (pending progress, may be able to return home). Will continue to follow.     08/18/19 1227  OT Visit Information  Last OT Received On 08/18/19  Assistance Needed +1 (+2 may be helpful for chair follow)  PT/OT/SLP Co-Evaluation/Treatment  (overlap with PT, for safety with mobility)  History of Present Illness 68 yo woman with a history of CAD with prior CABG, HFmrEF, DM, CKD stage IV, hx of CVA, admitted after falling twice in the bathroom, denies any loss of consciousness with these falls, no acute injuries found.  Work-up on admission revealed severe AKI and acute on chronic anemia.  Precautions  Precautions Fall  Restrictions  Weight Bearing Restrictions No  Home Living  Family/patient expects to be discharged to: Private residence  Living Arrangements Spouse/significant other  Available Help at Discharge Family;Available 24 hours/day  Type of Home Apartment  Home Access Stairs to enter  Entrance Stairs-Number of Steps 13  Entrance Stairs-Rails Can reach both  Home Layout One level  Bathroom Counselling psychologist seat;Cane - single point;Grab bars - tub/shower  Prior Function  Level of Independence Independent  Comments intermittent use of SPC  Communication  Communication No difficulties  Pain Assessment  Pain Assessment No/denies pain  Cognition  Arousal/Alertness Awake/alert  Behavior During Therapy WFL for tasks assessed/performed  Overall Cognitive Status Within Functional Limits for tasks assessed  Upper Extremity Assessment  Upper Extremity Assessment Generalized weakness  Lower Extremity Assessment  Lower Extremity Assessment Defer to PT evaluation  ADL  Overall ADL's  Needs assistance/impaired  Eating/Feeding Modified independent;Sitting  Eating/Feeding Details (indicate cue type and reason) pt finishing lunch seated EOB upon arrival  Grooming Wash/dry face;Set up;Sitting  Upper Body Bathing Min guard;Sitting  Lower Body Bathing Moderate assistance;Sit to/from stand  Upper Body Dressing  Minimal assistance;Sitting  Lower Body Dressing Maximal assistance;Sit to/from Retail buyer Minimal assistance;+2 for safety/equipment;Ambulation;RW  Toilet Transfer Details (indicate cue type and reason) simulated via transfer to/from EOB, room level mobility   Toileting- Clothing Manipulation and Hygiene Moderate assistance;Sit to/from stand  Functional mobility during ADLs Minimal assistance;+2 for safety/equipment;Rolling walker  General ADL Comments pt with LE weakness and noted some LE buckling with room level mobility, required cues for safe RW use including maintaining close proximity and UE support as pt attempting to rest forearms on RW while mobilizing; PT arriving during session therefore utilized +2 for safety during remaining mobility tasks   Bed Mobility  Overal bed mobility Needs Assistance  Bed Mobility Supine to Sit;Sit to Supine  Supine to sit Min guard  Sit to supine Min guard  General bed mobility comments increased  time/effort, minguard for safety   Transfers  Overall transfer level Needs assistance  Equipment used Rolling walker (2 wheeled)  Transfers Sit to/from Stand  Sit to Stand Min assist  General transfer comment boosting assist to rise and steady at RW  OT - End of Session  Equipment Utilized During Treatment Gait belt;Rolling walker  Activity Tolerance Patient tolerated treatment well  Patient left in bed;with call bell/phone within reach;with bed alarm set  Nurse Communication Mobility status  OT Assessment  OT Recommendation/Assessment Patient needs continued OT Services  OT Visit Diagnosis Muscle weakness (generalized) (M62.81);Unsteadiness on feet (R26.81)  OT Problem List Decreased strength;Decreased range of motion;Decreased activity tolerance;Impaired balance (sitting and/or standing);Decreased safety awareness;Decreased knowledge of use of DME or AE;Obesity;Cardiopulmonary status limiting activity;Decreased knowledge of precautions  OT Plan  OT Frequency (ACUTE ONLY) Min 2X/week  OT Treatment/Interventions (ACUTE ONLY) Self-care/ADL training;Therapeutic exercise;Energy conservation;DME and/or AE instruction;Therapeutic activities;Patient/family education;Balance training  AM-PAC OT "6 Clicks" Daily Activity Outcome Measure (Version 2)  Help from another person eating meals? 4  Help from another person taking care of personal grooming? 3  Help from another person toileting, which includes using toliet, bedpan, or urinal? 2  Help from another person bathing (including washing, rinsing, drying)? 2  Help from another person to put on and taking off regular upper body clothing? 3  Help from another person to put on and taking off regular lower body clothing? 2  6 Click Score 16  OT Recommendation  Follow Up Recommendations SNF;Supervision/Assistance - 24 hour (pending progress)  OT Equipment 3 in 1 bedside commode (will likely require bariatric)  Individuals Consulted  Consulted and  Agree with Results and Recommendations Patient  Acute Rehab OT Goals  Patient Stated Goal regain strength/independence  OT Goal Formulation With patient  Time For Goal Achievement 09/01/19  Potential to Achieve Goals Good  OT Time Calculation  OT Start Time (ACUTE ONLY) 1233  OT Stop Time (ACUTE ONLY) 1308  OT Time Calculation (min) 35 min  OT General Charges  $OT Visit 1 Visit  OT Evaluation  $OT Eval Moderate Complexity 1 Mod   Lou Cal, OT [employer] Pager [phone] Office [phone]

## 2019-08-18 NOTE — Progress Notes (Signed)
Subjective: Patient evaluated this morning. She is resting comfortably in bed and in no acute distress. She reports tolerating the endoscopy well yesterday. She has received 2U pRBCs since yesterday for low Hb. She remains asymptomatic.   Objective:  Vital signs in last 24 hours: Vitals:   08/18/19 0526 08/18/19 0545 08/18/19 0802 08/18/19 0830  BP: (!) 138/43 131/67 (!) 126/53 (!) 128/58  Pulse: 79 71 70 71  Resp: 18 18 20    Temp: 100.1 F (37.8 C) 99.2 F (37.3 C) 99.4 F (37.4 C) 99.4 F (37.4 C)  TempSrc: Oral Oral Oral Oral  SpO2:   96% 97%  Weight:      Height:       Physical Exam  Constitutional: She is oriented to person, place, and time and well-developed, well-nourished, and in no distress.  Cardiovascular: Normal rate, regular rhythm, normal heart sounds and intact distal pulses. Exam reveals no gallop and no friction rub.  No murmur heard. Pulmonary/Chest: Effort normal and breath sounds normal. No respiratory distress. She has no wheezes. She has no rales. She exhibits no tenderness.  Abdominal: Soft. Bowel sounds are normal. She exhibits no distension. There is no abdominal tenderness. There is no guarding.  Musculoskeletal: Normal range of motion.        General: No deformity or edema.     Comments: Tenderness at left hip with superficial abrasion noted.   Neurological: She is alert and oriented to person, place, and time. No cranial nerve deficit. Coordination normal.     Assessment/Plan:  Principal Problem:   Acute blood loss anemia Active Problems:   Coronary artery disease   Obesity, Class III, BMI 40-49.9 (morbid obesity) (HCC)   CKD (chronic kidney disease) stage 4, GFR 15-29 ml/min (HCC)   Acute on chronic renal failure (HCC)   Iron deficiency anemia   GI bleed   Esophageal stricture   Diabetic gastroparesis (HCC)   Heme positive stool  Patient is 68yo female with PMHx of CAD s/p CABG in 203, CKD stage 4, colonic AVMs presenting with acute on  chronic anemia and AKI. EGD on 8/20 negative for acute upper GI bleeding. Pt s/p 2u pRBC with Hb 8.0. AKI on CKD IV/V likely intrarenal etiology.   Acute blood loss anemia on chronic anemia:  Patient has known history of colonic AVMs with baseline Hb 9-11 presented with Hb 7.3, dropped to 7.0. . Iron studies 8/19:  Fe 19, Ferritin 14, TIBC 307, Saturation ratio 6%, consistent with IDA. Patient received 1u pRBC with only mild improvement in Hb to 7.4 and was given another unit of pRBC. H/H post transfusion 8.0/27.3. Patient also given IV feraheme yesterday.  EGD yesterday with esophagitis with strictures but negative for acute bleeding. Per GI recommendations, colonoscopy deferred unless patient has overt bleeding.   - Continue to monitor with CBC - Continue Protonix 40mg  qd  AKI on CKD IV/V:  Baseline Cr 3.3-3.4  BUN/Cr 132/6.1 on admission, 124/6.22 today. Renal function not improved despite Foley or fluid challenge. FENa 1.8% suggests possible intrinsic etiology. Patient initially had Foley but was discontinued after EGD. Patient does not report any trouble with urination. Per nephrology recommendations, patient NPO after midnight for possible tunneled access for HD tomorrow. Supportive care with hemodynamics correction and volume expanding with pRBCs.   - Renal diet  - NaHCO3 650mg  tid  - Renal function panel  -Continue strict I's/O, Foley  Mechanical falls w/o syncope: Patient reports two episodes of mechanical falls where she tripped on  the bathroom rug on Sunday and again on Monday. Patient denies any dizziness or lightheadedness prior to these episodes. Patient reports a few episodes of lightheadedness immediately upon standing, requiring her to pause before proceeding to walk. She also denies any head trauma or loss of consciousness. She reports landing on her knee and back. No signs of musculoskeletal trauma on imaging studies; however continued hip pain with bruising on left hip noted.  No falls since being in hospital.   - Continue to monitor - PT/OT evaluation   Insulin dependent diabetes mellitus: Patient on Lantus 21U + novolog 3U tid at home. Last HbA1c 6.6 on 03/2019. CBG 106-153.   - Novolog 3U tid + SSI  - CBG monitoring  Hypertension, HFmrEF: EF on Echo 40-45% on TTE in April 2020.  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 Recommend followup by ultrasound in 3 years. - Continue to monitor  Hypercholesterolemia: Patient on atorvastatin 40mg  qd - Continue home meds  Hx of depression: Wellbutrin 150mg  bid - Continue home meds  Hx of gout: Allopurinol 100mg  qd - Continue home meds  Diet: Renal diet  Fluids:LR 125 ml/hr Code: Full code DVT Prophylaxis: SCDs  Dispo: Anticipated discharge in approximately 1-2 day(s).   Harvie Heck, MD  Internal Medicine, PGY-1 08/18/2019, 10:01 AM Pager: @[email or web address] @

## 2019-08-18 NOTE — Evaluation (Signed)
Physical Therapy Evaluation Patient Details Name: [name] [name] MRN: [id number] DOB: [birth year] Today's Date: 08/18/2019   History of Present Illness  Patient is a 68 year old female admitted with anemia. Hgb was down to 7.3 at admission. Patient PMH to include DM, CAD s/p CABG, CKD, HF.  Clinical Impression  Patient received walking back to bed finishing OT evaluation. Once rested she agrees to walk again. Patient requires min guard assist for sit to stand and bed mobility. Cues for safety when using RW. Patient is able to ambulate 15- 20 feet min guard assist. Fatigued with this amount of activity. Reports she does not do much at baseline when at home. Patient will benefit from continued skilled PT acutely to improve activity tolerance, improve safety with mobility, and for strengthening.        Follow Up Recommendations Home health PT    Equipment Recommendations  Rolling walker with 5" wheels    Recommendations for Other Services       Precautions / Restrictions Precautions Precautions: Fall Restrictions Weight Bearing Restrictions: No      Mobility  Bed Mobility Overal bed mobility: Needs Assistance Bed Mobility: Supine to Sit;Sit to Supine     Supine to sit: Min guard Sit to supine: Min guard   General bed mobility comments: increased time/effort, minguard for safety   Transfers Overall transfer level: Needs assistance Equipment used: Rolling walker (2 wheeled) Transfers: Sit to/from Stand Sit to Stand: Min assist         General transfer comment: boosting assist to rise and steady at RW  Ambulation/Gait Ambulation/Gait assistance: Min guard Gait Distance (Feet): 20 Feet Assistive device: Rolling walker (2 wheeled) Gait Pattern/deviations: Step-to pattern;Decreased step length - right;Decreased step length - left Gait velocity: decreased   General Gait Details: slow, steady ambulation, cues to stay within the RW  Stairs            Wheelchair  Mobility    Modified Rankin (Stroke Patients Only)       Balance Overall balance assessment: Modified Independent;Needs assistance Sitting-balance support: Feet supported;No upper extremity supported Sitting balance-Leahy Scale: Good     Standing balance support: Bilateral upper extremity supported Standing balance-Leahy Scale: Good                               Pertinent Vitals/Pain Pain Assessment: No/denies pain    Home Living Family/patient expects to be discharged to:: Private residence Living Arrangements: Spouse/significant other Available Help at Discharge: Available 24 hours/day;Family Type of Home: Apartment Home Access: Stairs to enter Entrance Stairs-Rails: Can reach both Entrance Stairs-Number of Steps: 13 Home Layout: One level Home Equipment: Shower seat;Cane - single point;Grab bars - tub/shower      Prior Function Level of Independence: Independent   Gait / Transfers Assistance Needed: Uses quad cane, reports she does not have a walker  ADL's / Homemaking Assistance Needed: Boyfriend assists with all shopping needs  Comments: intermittent use of SPC     Hand Dominance   Dominant Hand: Right    Extremity/Trunk Assessment   Upper Extremity Assessment Upper Extremity Assessment: Defer to OT evaluation    Lower Extremity Assessment Lower Extremity Assessment: Generalized weakness    Cervical / Trunk Assessment Cervical / Trunk Assessment: Normal  Communication   Communication: No difficulties  Cognition Arousal/Alertness: Awake/alert Behavior During Therapy: WFL for tasks assessed/performed Overall Cognitive Status: Within Functional Limits for tasks assessed  General Comments      Exercises     Assessment/Plan    PT Assessment Patient needs continued PT services  PT Problem List Decreased strength;Decreased activity tolerance;Decreased mobility;Decreased  balance;Decreased knowledge of use of DME       PT Treatment Interventions Gait training;Stair training;Functional mobility training;Therapeutic exercise;Patient/family education;Therapeutic activities;DME instruction    PT Goals (Current goals can be found in the Care Plan section)  Acute Rehab PT Goals Patient Stated Goal: regain strength/independence PT Goal Formulation: With patient Time For Goal Achievement: 09/01/19 Potential to Achieve Goals: Good    Frequency Min 3X/week   Barriers to discharge Inaccessible home environment 13 steps to get to her apartment    Co-evaluation               AM-PAC PT "6 Clicks" Mobility  Outcome Measure Help needed turning from your back to your side while in a flat bed without using bedrails?: A Little Help needed moving from lying on your back to sitting on the side of a flat bed without using bedrails?: A Little Help needed moving to and from a bed to a chair (including a wheelchair)?: A Little Help needed standing up from a chair using your arms (e.g., wheelchair or bedside chair)?: A Little Help needed to walk in hospital room?: A Little Help needed climbing 3-5 steps with a railing? : A Lot 6 Click Score: 17    End of Session Equipment Utilized During Treatment: Gait belt Activity Tolerance: Patient limited by fatigue Patient left: in bed;with bed alarm set;with call bell/phone within reach Nurse Communication: Mobility status PT Visit Diagnosis: Muscle weakness (generalized) (M62.81);Difficulty in walking, not elsewhere classified (R26.2);History of falling (Z91.81)    Time: 1250-1305 PT Time Calculation (min) (ACUTE ONLY): 15 min   Charges:   PT Evaluation $PT Eval Moderate Complexity: 1 Mod PT Treatments $Gait Training: 8-22 mins        Rian Busche, PT, GCS 08/18/19,2:18 PM

## 2019-08-18 NOTE — TOC Initial Note (Signed)
Transition of Care [facility]) - Initial/Assessment Note    Patient Details  Name: [name] [name] MRN: [id number] Date of Birth: 10-12-1951  Transition of Care [name]) CM/SW Contact:    [name] Collet, RN Phone Number: 08/18/2019, 12:11 PM  Clinical Narrative:                 [name] [name] w patient at bedside. She is from home with spouse, has RW, get frequent transfusions. We discussed [name] [name] services and providers and medicare ratings. She would like to use [name] [name]. Referral placed to [name] [name] for RN PT OT, pending approval.  CM will continue to follow  Expected Discharge Plan: [place] Barriers to Discharge: Continued Medical Work up   Patient Goals and CMS Choice Patient states their goals for this hospitalization and ongoing recovery are:: to go home CMS Medicare.gov Compare Post Acute Care list provided to:: Patient Choice offered to / list presented to : Patient  Expected Discharge Plan and Services Expected Discharge Plan: [place] Acute Care Choice: Home Health                             HH Arranged: RN, PT, OT [facility] Agency: [name] [name] ([name] [name]) Date [facility] Agency Contacted: 08/18/19 Time [place]: 1211 Representative spoke with at [place]: [name] [name]  Prior Living Arrangements/Services                       Activities of Daily Living Home Assistive Devices/Equipment: Grab bars in shower, Raised toilet seat with rails, Shower chair with back, Scales, CBG Meter, Dentures (specify type) ADL Screening (condition at time of admission) Patient's cognitive ability adequate to safely complete daily activities?: No Is the patient deaf or have difficulty hearing?: No Does the patient have difficulty seeing, even when wearing glasses/contacts?: Yes Does the patient have difficulty concentrating, remembering, or making decisions?: No Patient able to express need for assistance with ADLs?: Yes Does the patient have difficulty  dressing or bathing?: No Independently performs ADLs?: Yes (appropriate for developmental age) Does the patient have difficulty walking or climbing stairs?: Yes Weakness of Legs: Both Weakness of Arms/Hands: Both  Permission Sought/Granted                  Emotional Assessment              Admission diagnosis:  Low back pain [M54.5] Iron deficiency anemia due to chronic blood loss [D50.0] AKI (acute kidney injury) ([place]) [N17.9] Fall, initial encounter [W19.XXXA] Gastrointestinal hemorrhage, unspecified gastrointestinal hemorrhage type [K92.2] Patient Active Problem List   Diagnosis Date Noted  . Acute blood loss anemia 08/17/2019  . Esophageal stricture   . Diabetic gastroparesis ([place])   . Heme positive stool   . GI bleed 08/16/2019  . Acute respiratory failure with hypoxia and hypercapnia ([place]) 06/02/2019  . Hypertensive crisis 06/02/2019  . Acute on chronic combined systolic and diastolic CHF (congestive heart failure) ([place]) 06/02/2019  . Acute on chronic respiratory failure with hypoxia ([place]) 04/27/2019  . Elevated troponin 04/27/2019  . Lobar pneumonia ([place]) 04/27/2019  . Sepsis ([place]) 04/27/2019  . Hyperkalemia 04/27/2019  . Fever   . Symptomatic anemia 09/16/2016  . Diabetes mellitus with complication ([place]) 58/85/0277  . Obesity 04/29/2016  . Hiatal hernia   . Heme + stool   . Absolute anemia   . Bleeding gastrointestinal   .  Insulin dependent diabetes mellitus ([place])   . Pain in the chest   . Demand ischemia ([place])   . Iron deficiency anemia   . Precordial pain   . Chest pain 04/17/2016  . Thrombocytosis ([place]) 04/17/2016  . Microcytic anemia 02/05/2016  . Abscess   . Acute systolic heart failure ([place])   . Acute on chronic renal failure ([place])   . CAD in native artery   . Substance abuse ([place])   . Chronic combined systolic and diastolic CHF (congestive heart failure) ([name])   . CKD (chronic kidney disease) stage 4, GFR 15-29 ml/min (HCC)   . Demand  ischemia of myocardium - related to Acue HF exacerbation 05/14/2015  . Obesity, Class III, BMI 40-49.9 (morbid obesity) ([place])   . Acute on chronic combined systolic and diastolic congestive heart failure ([place]) 05/12/2015  . Acute respiratory failure ([place]) 10/18/2014  . Type 2 diabetes mellitus with renal manifestations, controlled ([place]) 10/18/2014  . S/P CABG (coronary artery bypass graft) 09/21/2013  . Arthritis of right shoulder region 03/23/2013  . Tobacco abuse   . Coronary artery disease   . NSTEMI (non-ST elevated myocardial infarction) ([place]) 06/29/2012  . History of cocaine abuse ([place]) 06/29/2012  . AKI (acute kidney injury) ([place]) 06/29/2012  . Essential hypertension 06/29/2012  . RBC microcytosis 06/29/2012  . Glaucoma   . Hyperlipidemia   . History of CVA (cerebrovascular accident)    PCP:  [facility] Pharmacy:   [facility] [address] [place] [address] Phone: [phone] Fax: [place], [place] - [address] [place] [address] Phone: [phone] Fax: [phone]     Social Determinants of Health (SDOH) Interventions    Readmission Risk Interventions Readmission Risk Prevention Plan 06/03/2019 06/02/2019 04/29/2019  Transportation Screening - Complete Complete  Medication Review Press photographer) - Complete Complete  PCP or Specialist appointment within 3-5 days of discharge Complete - Complete  HRI or [place] - - (No Data)  SW Recovery Care/Counseling Consult - Complete Complete  Palliative Care Screening - Not Applicable Not Applicable  Skilled Nursing Facility - Not Applicable Not Applicable  Some recent data might be hidden

## 2019-08-19 ENCOUNTER — Encounter (HOSPITAL_COMMUNITY): Payer: Self-pay | Admitting: Interventional Radiology

## 2019-08-19 ENCOUNTER — Inpatient Hospital Stay (HOSPITAL_COMMUNITY): Payer: Medicare Other

## 2019-08-19 DIAGNOSIS — R509 Fever, unspecified: Secondary | ICD-10-CM

## 2019-08-19 HISTORY — PX: IR FLUORO GUIDE CV LINE RIGHT: IMG2283

## 2019-08-19 HISTORY — PX: IR US GUIDE VASC ACCESS RIGHT: IMG2390

## 2019-08-19 LAB — GLUCOSE, CAPILLARY
Glucose-Capillary: 166 mg/dL — ABNORMAL HIGH (ref 70–99)
Glucose-Capillary: 120 mg/dL — ABNORMAL HIGH (ref 70–99)
Glucose-Capillary: 240 mg/dL — ABNORMAL HIGH (ref 70–99)
Glucose-Capillary: 181 mg/dL — ABNORMAL HIGH (ref 70–99)

## 2019-08-19 LAB — CBC
HCT: 28.1 % — ABNORMAL LOW (ref 36.0–46.0)
Hemoglobin: 8.6 g/dL — ABNORMAL LOW (ref 12.0–15.0)
MCH: 24.7 pg — ABNORMAL LOW (ref 26.0–34.0)
MCHC: 30.6 g/dL (ref 30.0–36.0)
MCV: 80.7 fL (ref 80.0–100.0)
Platelets: 420 10*3/uL — ABNORMAL HIGH (ref 150–400)
RBC: 3.48 MIL/uL — ABNORMAL LOW (ref 3.87–5.11)
RDW: 19 % — ABNORMAL HIGH (ref 11.5–15.5)
WBC: 13.3 10*3/uL — ABNORMAL HIGH (ref 4.0–10.5)
nRBC: 0.8 % — ABNORMAL HIGH (ref 0.0–0.2)

## 2019-08-19 LAB — URINALYSIS, ROUTINE W REFLEX MICROSCOPIC
Bilirubin Urine: NEGATIVE
Glucose, UA: NEGATIVE mg/dL
Hgb urine dipstick: NEGATIVE
Ketones, ur: NEGATIVE mg/dL
Nitrite: NEGATIVE
Protein, ur: NEGATIVE mg/dL
Specific Gravity, Urine: 1.012 (ref 1.005–1.030)
pH: 5 (ref 5.0–8.0)

## 2019-08-19 LAB — TYPE AND SCREEN
ABO/RH(D): O POS
Antibody Screen: NEGATIVE
Unit division: 0
Unit division: 0

## 2019-08-19 LAB — COMPREHENSIVE METABOLIC PANEL
ALT: 11 U/L (ref 0–44)
AST: 12 U/L — ABNORMAL LOW (ref 15–41)
Albumin: 3.1 g/dL — ABNORMAL LOW (ref 3.5–5.0)
Alkaline Phosphatase: 107 U/L (ref 38–126)
Anion gap: 13 (ref 5–15)
BUN: 123 mg/dL — ABNORMAL HIGH (ref 8–23)
CO2: 16 mmol/L — ABNORMAL LOW (ref 22–32)
Calcium: 8.5 mg/dL — ABNORMAL LOW (ref 8.9–10.3)
Chloride: 109 mmol/L (ref 98–111)
Creatinine, Ser: 6.25 mg/dL — ABNORMAL HIGH (ref 0.44–1.00)
GFR calc Af Amer: 7 mL/min — ABNORMAL LOW (ref >60)
GFR calc non Af Amer: 6 mL/min — ABNORMAL LOW (ref >60)
Glucose, Bld: 128 mg/dL — ABNORMAL HIGH (ref 70–99)
Potassium: 4.8 mmol/L (ref 3.5–5.1)
Sodium: 138 mmol/L (ref 135–145)
Total Bilirubin: 0.3 mg/dL (ref 0.3–1.2)
Total Protein: 6.5 g/dL (ref 6.5–8.1)

## 2019-08-19 LAB — BPAM RBC
Blood Product Expiration Date: 202009142359
Blood Product Expiration Date: 202009152359
ISSUE DATE / TIME: 202008191730
ISSUE DATE / TIME: 202008200511
Unit Type and Rh: 5100
Unit Type and Rh: 5100

## 2019-08-19 MED ORDER — CHLORHEXIDINE GLUCONATE CLOTH 2 % EX PADS
6.0000 | MEDICATED_PAD | Freq: Every day | CUTANEOUS | Status: DC
  Administered 2019-08-20 – 2019-08-22 (×2): 6 via TOPICAL

## 2019-08-19 MED ORDER — LIDOCAINE HCL (PF) 1 % IJ SOLN: 5.0000 mL | INTRAMUSCULAR | Status: DC | PRN

## 2019-08-19 MED ORDER — LIDOCAINE HCL 1 % IJ SOLN
INTRAMUSCULAR | Status: AC
  Filled 2019-08-19: qty 20

## 2019-08-19 MED ORDER — SODIUM CHLORIDE 0.9 % IV SOLN: 100.0000 mL | INTRAVENOUS | Status: DC | PRN

## 2019-08-19 MED ORDER — HEPARIN SODIUM (PORCINE) 1000 UNIT/ML DIALYSIS
1000.0000 [IU] | INTRAMUSCULAR | Status: DC | PRN
  Administered 2019-08-22: 04:00:00 1000 [IU] via INTRAVENOUS_CENTRAL
  Filled 2019-08-19 (×2): qty 1

## 2019-08-19 MED ORDER — HEPARIN SODIUM (PORCINE) 1000 UNIT/ML IJ SOLN
INTRAMUSCULAR | Status: AC
  Filled 2019-08-19: qty 3

## 2019-08-19 MED ORDER — HEPARIN SODIUM (PORCINE) 1000 UNIT/ML IJ SOLN
INTRAMUSCULAR | Status: AC
  Filled 2019-08-19: qty 1

## 2019-08-19 MED ORDER — SODIUM BICARBONATE 650 MG PO TABS
1300.0000 mg | ORAL_TABLET | Freq: Two times a day (BID) | ORAL | Status: DC
  Administered 2019-08-19 (×2): 1300 mg via ORAL
  Filled 2019-08-19 (×3): qty 2

## 2019-08-19 MED ORDER — PENTAFLUOROPROP-TETRAFLUOROETH EX AERO: 1.0000 "application " | INHALATION_SPRAY | CUTANEOUS | Status: DC | PRN

## 2019-08-19 MED ORDER — CHLORHEXIDINE GLUCONATE CLOTH 2 % EX PADS
6.0000 | MEDICATED_PAD | Freq: Every day | CUTANEOUS | Status: DC
  Administered 2019-08-20 – 2019-08-21 (×2): 6 via TOPICAL

## 2019-08-19 MED ORDER — LIDOCAINE-PRILOCAINE 2.5-2.5 % EX CREA
1.0000 "application " | TOPICAL_CREAM | CUTANEOUS | Status: DC | PRN
  Filled 2019-08-19: qty 5

## 2019-08-19 MED ORDER — LIDOCAINE HCL (PF) 1 % IJ SOLN
INTRAMUSCULAR | Status: DC | PRN
  Administered 2019-08-19: 5 mL

## 2019-08-19 MED ORDER — ALTEPLASE 2 MG IJ SOLR: 2.0000 mg | Freq: Once | INTRAMUSCULAR | Status: DC | PRN

## 2019-08-19 MED ORDER — NYSTATIN 100000 UNIT/GM EX POWD
Freq: Two times a day (BID) | CUTANEOUS | Status: AC
  Administered 2019-08-19 – 2019-08-21 (×6): via TOPICAL
  Filled 2019-08-19: qty 15

## 2019-08-19 MED ORDER — BARRIER CREAM NON-SPECIFIED
1.0000 "application " | TOPICAL_CREAM | Freq: Two times a day (BID) | TOPICAL | Status: DC | PRN
  Filled 2019-08-19: qty 1

## 2019-08-19 NOTE — Progress Notes (Signed)
Subjective: Patient evaluated at bedside this morning. She reports no acute distress and is resting comfortably in bed. Patient understands need for dialysis and is agreeable to start HD today.  Of note, patient had Tmax 101.2 and leukocytosis. Patient asymptomatic at this time. Will follow up on urine and blood cx.    Objective:  Vital signs in last 24 hours: Vitals:   08/19/19 0442 08/19/19 0615 08/19/19 0620 08/19/19 0805  BP: (!) 160/65   (!) 136/57  Pulse: 88   74  Resp:    (!) 21  Temp: (!) 101.2 F (38.4 C)   99.6 F (37.6 C)  TempSrc: Oral   Oral  SpO2: (!) 78% (!) 88% 94% 95%  Weight:      Height:       Physical Exam Constitutional:      General: She is not in acute distress.    Appearance: Normal appearance. She is obese. She is not ill-appearing.  Neck:     Musculoskeletal: Normal range of motion and neck supple.  Cardiovascular:     Rate and Rhythm: Normal rate and regular rhythm.     Pulses: Normal pulses.     Heart sounds: Normal heart sounds. No murmur. No friction rub. No gallop.   Pulmonary:     Effort: Pulmonary effort is normal. No respiratory distress.     Breath sounds: Normal breath sounds. No stridor. No wheezing, rhonchi or rales.  Abdominal:     General: Bowel sounds are normal. There is no distension.     Palpations: Abdomen is soft.     Tenderness: There is no abdominal tenderness. There is no guarding.  Musculoskeletal: Normal range of motion.  Skin:    General: Skin is warm and dry.     Findings: Rash present.     Comments: Erythematous area under pannus   Neurological:     General: No focal deficit present.     Mental Status: She is alert and oriented to person, place, and time. Mental status is at baseline.     Cranial Nerves: No cranial nerve deficit.     Motor: No weakness.      Assessment/Plan:  Principal Problem:   Acute blood loss anemia Active Problems:   Coronary artery disease   Obesity, Class III, BMI 40-49.9 (morbid  obesity) (HCC)   CKD (chronic kidney disease) stage 4, GFR 15-29 ml/min (HCC)   Acute on chronic renal failure (HCC)   Iron deficiency anemia   GI bleed   Esophageal stricture   Diabetic gastroparesis (HCC)   Heme positive stool  Patient is 68yo female with PMHx of CAD s/p CABG in 203, CKD stage 4, colonic AVMs presenting with acute on chronic anemia and AKI. EGD on 8/20 negative for acute upper GI bleeding. Pt s/p 2u pRBC with appropriate response in Hb. AKI on CKD IV/V likely intrarenal etiology.   Acute blood loss anemia on chronic anemia:  Patient has known history of colonic AVMs with baseline Hb 9-11 presented with Hb 7.3, dropped to 7.0. EGD negative for acute upper GI bleeding. Colonoscopy deferred unless overt bleeding.  Iron studies 8/19:  Fe 19, Ferritin 14, TIBC 307, Saturation ratio 6%, consistent with IDA.  Patient given IV feraheme on 8/19. Patient s/p 2u pRBC and Hb this AM 8.6.   - Continue to monitor with CBC - Continue Protonix 40mg  qd  Fever: Patient Tmax 101.2 overnight and WBC 13.3 this AM. UA on admission with moderate leukocytes and bacteria.  Patient remains asymptomatic at this time. Of note, patient had Foley discontinued after endoscopy. Urinating approximately 778mL overnight. PVR >262ml with 466mL urine on Foley reinsertion. UA today with small leukocytes and bacteria present but no WBC noted in urine.  Unlikely related to blood transfusion as fever onset >12 hours after transfusion.   - F/u urine and blood cultures - Continue to monitor    AKI on CKD IV/V vs. Progression to ESRD:  Baseline Cr 3.3-3.4  BUN/Cr 132/6.1 on admission, 124/6.22 today. Renal function not improved despite Foley or fluid challenge. FENa 1.8% suggests possible intrinsic etiology. Foley reinserted yesterday with 400cc output noted.  Per nephrology recommendations, patient for HD access today and start HD. Supportive care with hemodynamics correction and volume expanding with pRBCs.    - Renal diet  - NaHCO3 650mg  tid  - Renal function panel  -Continue strict I's/O, Foley  Mechanical falls w/o syncope: Patient reports two episodes of mechanical falls where she tripped on the bathroom rug on Sunday and again on Monday. Patient denies any dizziness or lightheadedness prior to these episodes. Patient reports a few episodes of lightheadedness immediately upon standing, requiring her to pause before proceeding to walk. She also denies any head trauma or loss of consciousness. She reports landing on her knee and back. No signs of musculoskeletal trauma on imaging studies; however continued hip pain with bruising on left hip noted. No falls since being in hospital.  - Continue to monitor - PT/OT evaluation   Insulin dependent diabetes mellitus: Patient on Lantus 21U + novolog 3U tid at home. Last HbA1c 6.6 on 03/2019.   - Novolog 3U tid + SSI  - CBG monitoring  Hypertension, HFmrEF: EF on Echo 40-45% on TTE in April 2020.  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 Recommend followup by ultrasound in 3 years. - Continue to monitor  Hypercholesterolemia: - Continue atorvastatin 40mg  qd  Hx of depression: - Continue Wellbutrin 150mg  bid  Hx of gout: - Continue Allopurinol 100mg  qd  Diet:Renal diet Fluids:LR 125 ml/hr Code: Full code DVT Prophylaxis: SCDs  Dispo: Anticipated discharge in approximately 2-3 day(s).   Harvie Heck, MD  Internal Medicine, PGY-1 08/19/2019, 10:54 AM Pager: [phone]

## 2019-08-19 NOTE — Procedures (Signed)
Interventional Radiology Procedure Note  Procedure: Non-tunneled HD catheter placement  Complications: None  Estimated Blood Loss: < 10 mL  Findings: 16 cm, triple lumen Mahurkar catheter via right IJ vein. Tip at SVC/RA junction. OK to use.  Venetia Night. Kathlene Cote, M.D Pager:  [phone]

## 2019-08-19 NOTE — Progress Notes (Signed)
   Vital Signs MEWS/VS Documentation       08/19/2019 1746 08/19/2019 1930 08/19/2019 2044 08/19/2019 2222   MEWS Score:  1  1  3  1    MEWS Score Color:  Green  Green  Yellow  Green   Resp:  20  --  (!) 22  20   Pulse:  78  --  97  84   BP:  (!) 98/42  --  (!) 162/91  (!) 146/56   Temp:  99.4 F (37.4 C)  --  (!) 101.9 F (38.8 C)  (!) 100.9 F (38.3 C)   O2 Device:  Nasal Cannula  --  Nasal Cannula  Nasal Cannula   O2 Flow Rate (L/min):  2 L/min  --  2 L/min  2 L/min       Not acute change as PT had fever this AM. DR was aware. Tylenol given. Will monitor     Tanya Nones D Narjis Mira 08/19/2019,10:24 PM

## 2019-08-19 NOTE — Care Management Important Message (Signed)
Important Message  Patient Details  Name: [name] [name] MRN: [id number] Date of Birth: [birth year]   Medicare Important Message Given:  Yes     Shelda Altes 08/19/2019, 10:40 AM

## 2019-08-19 NOTE — Progress Notes (Addendum)
  Date: 08/19/2019  Patient name: [name] [name]  Medical record number: [id number]  Date of birth: 1951-11-23   I have seen and evaluated this patient and I have discussed the plan of care with the house staff. Please see their note for complete details. I concur with their findings with the following additions/corrections:  Unfortunately, renal function has not improved.  Plan is for placement of tunneled HD access today and beginning HD.  Fever this morning, unclear etiology, repeat urinalysis without pyuria, blood cultures pending, new hypoxemia as well, no significant findings on lung exam, but will need to investigate further with CXR.  Hgb up to 8.6 today, no need for further transfusion.  We will continue to trend daily to ensure she is not continuing to lose blood.  Lenice Pressman, M.D., Ph.D. 08/19/2019, 3:01 PM

## 2019-08-19 NOTE — Progress Notes (Signed)
[facility] Progress Note  Name: [name] [name] MRN: [id number] DOB: 12-18-1951  Chief Complaint:  Falls at home   Subjective:  Patient had a foley catheter inserted for retention with return of 400 mL urine on insertion per RN charting.  She had 1.3 liters of UOP over 8/20.  Fever this AM to 101.2.  S/p trial of limited duration normal saline.  She is ok with going ahead and starting dialysis.  Denies overt blood per rectum; states stool is dark brown.  Review of systems:   Appetite is ok  Denies n/v Denies shortness of breath or chest pain  ----------- Background on consult:  [name] [name] is a 1F with a PMH significant for CKD IV, HTN, HLD, chronic combined systolic and diastolic CHF, known colonic AVMs, CAD s/p CABG, h/o CVA, DM II who is now seen in consultation at the request of Dr Rebeca Alert for evaluation and recommendations surrounding AKI on CKD.  Briefly, pt experienced 2 mechanical falls the days prior to admission.  She was noted to have hgb 7.3 down from 9.5 in June 2020, BUN 132, Cr of 6.11 (baseline 3.57 05/2019).  FOBT +, underwent EGD today showing esophagitis with stricture.  C-scope not performed d/t concerns over prep and AKI.  In this setting we are asked to see.  Pt currently getting 1 u PRBCs.  No complaints.  Feeling OK although a little sleepy.  No f/c, n/v, SOB, CP.  No lethargy, hiccups, or loss of appetite.  No jerking movements or asterixis.  She last saw Dr. Joelyn Oms in January where she was found to be doing well and without significant complaint.  Was fluid challenged and had Foley placed; Cr down to 5.89 with Bun 127 on consult    Intake/Output Summary (Last 24 hours) at 08/19/2019 0659 Last data filed at 08/19/2019 0500 Gross per 24 hour  Intake 2907 ml  Output 1300 ml  Net 1607 ml    Vitals:  Vitals:   08/18/19 1140 08/18/19 2134 08/19/19 0439 08/19/19 0442  BP: 128/62 136/69  (!) 160/65  Pulse: 70 69  88  Resp: 20 20    Temp: 99.5 F (37.5  C) 98.3 F (36.8 C)  (!) 101.2 F (38.4 C)  TempSrc: Oral Oral  Oral  SpO2: 97% 96%  (!) 78%  Weight:   127.9 kg   Height:         Physical Exam:  General adult female in bed in no acute distress  HEENT normocephalic atraumatic extraocular movements intact sclera anicteric Neck supple trachea midline Lungs clear to auscultation bilaterally normal work of breathing at rest ; room air  Heart regular rate and rhythm no rubs or gallops appreciated Abdomen soft nontender nondistended Extremities no edema  Psych normal mood and affect Neuro - alert and oriented x 3 conversant  GU - no foley   Medications reviewed   Labs:  BMP Latest Ref Rng & Units 08/19/2019 08/18/2019 08/17/2019  Glucose 70 - 99 mg/dL 128(H) 176(H) 139(H)  BUN 8 - 23 mg/dL 123(H) 124(H) 127(H)  Creatinine 0.44 - 1.00 mg/dL 6.25(H) 6.22(H) 5.89(H)  Sodium 135 - 145 mmol/L 138 139 138  Potassium 3.5 - 5.1 mmol/L 4.8 4.5 4.2  Chloride 98 - 111 mmol/L 109 110 107  CO2 22 - 32 mmol/L 16(L) 17(L) 19(L)  Calcium 8.9 - 10.3 mg/dL 8.5(L) 8.2(L) 8.4(L)    Assessment/Plan:   1.  AKI on CKD IV/V: pt with baseline Cr 3.57 with eGFR  14 in 05/2019.  Increased to 6.11-->5.89 in the setting of GI bleed, possibly from colonic AVM.  She is azotemic (as expected in GIB) but not uremic at present.   - She appears to have had CKD progression to likely ESRD.  Note baseline GFR 14 most recently in June  - Initiate HD after access placement with IR.  Plan for HD on 8/22 as well.   - Continue foley catheter   2. Anemia: blood loss with likely anemia of chronic disease as well.  FOBT positive.  GI work-up per primary team.  On feraheme, pRBCs.  On ESA (added this admission).  Anemia improved with PRBC's  3. Urinary retention - continue foley catheter for now; placed this AM (after fever).  Hope to d/c soon   4. Fever - work-up per primary team - they have ordered blood cultures   5.  HTN: previously controlled   6. Metabolic  acidosis - s/p bicarbonate transition to HD for management    7.  DM: per primary team  8.  Hx Falls at home -noted.  Azotemia without overt uremia but following closely to determine if contribution; other work-up per primary team   9. Aortic atherosclerosis. 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  Per radiology recommend followup by ultrasound in 3 years.  [name], MD 08/19/2019 6:59 AM

## 2019-08-19 NOTE — Progress Notes (Signed)
Assisted to [facility] where patient voided 700 cc of clear, yellow urine. Bladder scan done with post-void residual of >274 ml noted. 16FR foley catheter inserted without difficulty with return of 400cc of urine.

## 2019-08-20 ENCOUNTER — Inpatient Hospital Stay (HOSPITAL_COMMUNITY): Payer: Medicare Other

## 2019-08-20 DIAGNOSIS — Z992 Dependence on renal dialysis: Secondary | ICD-10-CM

## 2019-08-20 DIAGNOSIS — N186 End stage renal disease: Secondary | ICD-10-CM

## 2019-08-20 DIAGNOSIS — I132 Hypertensive heart and chronic kidney disease with heart failure and with stage 5 chronic kidney disease, or end stage renal disease: Secondary | ICD-10-CM

## 2019-08-20 LAB — RENAL FUNCTION PANEL
Albumin: 2.7 g/dL — ABNORMAL LOW (ref 3.5–5.0)
Anion gap: 14 (ref 5–15)
BUN: 86 mg/dL — ABNORMAL HIGH (ref 8–23)
CO2: 20 mmol/L — ABNORMAL LOW (ref 22–32)
Calcium: 8.5 mg/dL — ABNORMAL LOW (ref 8.9–10.3)
Chloride: 107 mmol/L (ref 98–111)
Creatinine, Ser: 5.17 mg/dL — ABNORMAL HIGH (ref 0.44–1.00)
GFR calc Af Amer: 9 mL/min — ABNORMAL LOW (ref >60)
GFR calc non Af Amer: 8 mL/min — ABNORMAL LOW (ref >60)
Glucose, Bld: 164 mg/dL — ABNORMAL HIGH (ref 70–99)
Phosphorus: 4 mg/dL (ref 2.5–4.6)
Potassium: 4.2 mmol/L (ref 3.5–5.1)
Sodium: 141 mmol/L (ref 135–145)

## 2019-08-20 LAB — GLUCOSE, CAPILLARY
Glucose-Capillary: 218 mg/dL — ABNORMAL HIGH (ref 70–99)
Glucose-Capillary: 166 mg/dL — ABNORMAL HIGH (ref 70–99)
Glucose-Capillary: 188 mg/dL — ABNORMAL HIGH (ref 70–99)
Glucose-Capillary: 239 mg/dL — ABNORMAL HIGH (ref 70–99)

## 2019-08-20 LAB — METHYLMALONIC ACID, SERUM: Methylmalonic Acid, Quantitative: 956 nmol/L — ABNORMAL HIGH (ref 0–378)

## 2019-08-20 LAB — HEPATITIS B CORE ANTIBODY, TOTAL: Hep B Core Total Ab: NEGATIVE

## 2019-08-20 LAB — CBC
HCT: 25.4 % — ABNORMAL LOW (ref 36.0–46.0)
Hemoglobin: 7.5 g/dL — ABNORMAL LOW (ref 12.0–15.0)
MCH: 24 pg — ABNORMAL LOW (ref 26.0–34.0)
MCHC: 29.5 g/dL — ABNORMAL LOW (ref 30.0–36.0)
MCV: 81.2 fL (ref 80.0–100.0)
Platelets: 354 10*3/uL (ref 150–400)
RBC: 3.13 MIL/uL — ABNORMAL LOW (ref 3.87–5.11)
RDW: 19.5 % — ABNORMAL HIGH (ref 11.5–15.5)
WBC: 10.8 10*3/uL — ABNORMAL HIGH (ref 4.0–10.5)
nRBC: 1 % — ABNORMAL HIGH (ref 0.0–0.2)

## 2019-08-20 LAB — URINE CULTURE: Culture: NO GROWTH

## 2019-08-20 LAB — HEPATITIS B SURFACE ANTIBODY,QUALITATIVE: Hep B S Ab: NONREACTIVE

## 2019-08-20 MED ORDER — SODIUM CHLORIDE 0.9% FLUSH
10.0000 mL | INTRAVENOUS | Status: DC | PRN
  Administered 2019-08-24: 10 mL
  Filled 2019-08-20: qty 40

## 2019-08-20 MED ORDER — GABAPENTIN 300 MG PO CAPS
300.0000 mg | ORAL_CAPSULE | Freq: Every day | ORAL | Status: DC
  Administered 2019-08-21 – 2019-08-26 (×6): 300 mg via ORAL
  Filled 2019-08-20 (×6): qty 1

## 2019-08-20 NOTE — Progress Notes (Signed)
Lanice Schwab, RN called nad informed that this pt HD tx has been moved to 08/21/2019

## 2019-08-20 NOTE — Progress Notes (Signed)
[facility] Progress Note  Name: [name] [name] MRN: [id number] DOB: [birth year]  Chief Complaint:  Falls at home   Subjective:  She had a nontunneled dialysis catheter placed with IR due to fever.  Outlier BP 98/42 with preceding and following BP's of 409'W systolic.  Patient with 1.2 liters UOP over 8/21.  She had 1 kg UF with HD on 8/21.  Note 151/65 most recently.    Review of systems:  Denies n/v Denies shortness of breath - states she is breathing better today Denies chest pain Stools are brown    ----------- Background on consult:  [name] [name] is a 41F with a PMH significant for CKD IV, HTN, HLD, chronic combined systolic and diastolic CHF, known colonic AVMs, CAD s/p CABG, h/o CVA, DM II who is now seen in consultation at the request of Dr Rebeca Alert for evaluation and recommendations surrounding AKI on CKD.  Briefly, pt experienced 2 mechanical falls the days prior to admission.  She was noted to have hgb 7.3 down from 9.5 in June 2020, BUN 132, Cr of 6.11 (baseline 3.57 05/2019).  FOBT +, underwent EGD today showing esophagitis with stricture.  C-scope not performed d/t concerns over prep and AKI.  In this setting we are asked to see.  Pt currently getting 1 u PRBCs.  No complaints.  Feeling OK although a little sleepy.  No f/c, n/v, SOB, CP.  No lethargy, hiccups, or loss of appetite.  No jerking movements or asterixis.  She last saw Dr. Joelyn Oms in January where she was found to be doing well and without significant complaint.  Was fluid challenged and had Foley placed; Cr down to 5.89 with Bun 127 on consult    Intake/Output Summary (Last 24 hours) at 08/20/2019 0820 Last data filed at 08/20/2019 0615 Gross per 24 hour  Intake 480 ml  Output 2200 ml  Net -1720 ml    Vitals:  Vitals:   08/19/19 2222 08/19/19 2358 08/20/19 0600 08/20/19 0831  BP: (!) 146/56 (!) 134/52 117/85 (!) 151/65  Pulse: 84 78 81 79  Resp: _0 Temp: (!) 100.9 F (38.3 C) 99 F (37.2  C) 99.5 F (37.5 C)   TempSrc: Oral Oral Oral   SpO2: 98% 91% 95%   Weight:   126.6 kg   Height:         Physical Exam:  General adult female in bed in no acute distress  HEENT NCAT/ EOMI Neck supple trachea midline Lungs clear to auscultation bilaterally normal work of breathing at rest ; room air  Heart RRR no rub Abdomen soft nontender nondistended Extremities trace edema  Psych normal mood and affect Neuro - alert and oriented x 3 conversant  GU - has foley  Access: RIJ nontunneled dialysis catheter   Medications reviewed   Labs:  BMP Latest Ref Rng & Units 08/20/2019 08/19/2019 08/18/2019  Glucose 70 - 99 mg/dL 164(H) 128(H) 176(H)  BUN 8 - 23 mg/dL 86(H) 123(H) 124(H)  Creatinine 0.44 - 1.00 mg/dL 5.17(H) 6.25(H) 6.22(H)  Sodium 135 - 145 mmol/L 141 138 139  Potassium 3.5 - 5.1 mmol/L 4.2 4.8 4.5  Chloride 98 - 111 mmol/L 107 109 110  CO2 22 - 32 mmol/L 20(L) 16(L) 17(L)  Calcium 8.9 - 10.3 mg/dL 8.5(L) 8.5(L) 8.2(L)    Assessment/Plan:   1.  AKI on CKD IV/V: pt with baseline Cr 3.57 with eGFR 14 in 05/2019.  Increased to 6.11-->5.89 in the setting of  GI bleed, possibly from colonic AVM.  She is azotemic (as expected in GIB) but not uremic at present.  She appears to have had CKD progression to likely ESRD.  Note baseline GFR 14 most recently in June.  Initiated HD on 8/21 after nontunneled catheter placement with IR.   - Will perform HD 8/22 (2nd treatment) - Discontinue foley and monitor for recurrent retention  - Would decrease gabapentin to a max of 300 mg daily given her renal failure  - will likely need CLIP initiated as baseline GFR 14 prior to this presentation  - will request vein mapping - Note currently with nontunneled catheter due to fever  2. Anemia: blood loss with likely anemia of chronic disease as well.  FOBT positive.  GI work-up per primary team.  On feraheme, pRBCs.  On ESA (added this admission).  Anemia improved with PRBC's  3. Urinary  retention -  Discontinue foley.  Check bladder scan and in/out cath if over 300 mL as ordered  4. Fever - work-up per primary team.  Blood cultures NGTD.  Urine culture in process.  Bacturia. Abx per primary team   5.  HTN: acceptable control; hypotension transient.  If recurs will need to decrease or discontinue amlodipine.  6. Metabolic acidosis - s/p bicarbonate transition to HD for management    7.  DM: per primary team  8.  Hx Falls at home -noted.  Azotemia without overt uremia.  Other work-up per primary team.  Note UDS positive cocaine  9. Aortic atherosclerosis. 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  Per radiology recommend followup by ultrasound in 3 years.  [name], MD 08/20/2019 8:20 AM

## 2019-08-20 NOTE — Progress Notes (Signed)
  Date: 08/20/2019  Patient name: [name] [name]  Medical record number: [id number]  Date of birth: 02/15/1951   I have seen and evaluated this patient and I have discussed the plan of care with the house staff. Please see their note for complete details. I concur with their findings with the following additions/corrections:   Hgb down to 7.5, possibly fluid shifts due to HD, no evidence of further GI bleeding. If stays below 8, would transfuse another unit as goal is >8 for CAD and renal perfusion.   Still having fevers, but well-appearing, no symptoms, no respiratory distress, lung sounds distant, faint bibasilar crackles. Will continue to hold off on abx, follow blood cultures.   Lenice Pressman, M.D., Ph.D. 08/20/2019, 6:29 PM

## 2019-08-20 NOTE — Progress Notes (Signed)
Subjective: Patient examined at bedside this morning. She is resting comfortably in bed and does not relay any acute concerns. Patient had HD session yesterday and states that it went well and she is agreeable to another session today. She felt a little warm overnight with Tmax of 101.9 but denies any chills or acute pain. She has not had any trouble breathing but does note that she breaths better with the nasal cannula on. She has chronic history of snoring but does not use oxygen or CPAP at home.   Objective:  Vital signs in last 24 hours: Vitals:   08/19/19 2222 08/19/19 2358 08/20/19 0600 08/20/19 0831  BP: (!) 146/56 (!) 134/52 117/85 (!) 151/65  Pulse: 84 78 81 79  Resp: 20 18 20    Temp: (!) 100.9 F (38.3 C) 99 F (37.2 C) 99.5 F (37.5 C)   TempSrc: Oral Oral Oral   SpO2: 98% 91% 95%   Weight:   126.6 kg   Height:       Physical Exam Constitutional:      Appearance: Normal appearance.  HENT:     Mouth/Throat:     Mouth: Mucous membranes are moist.     Pharynx: Oropharynx is clear.  Cardiovascular:     Rate and Rhythm: Normal rate and regular rhythm.     Pulses: Normal pulses.     Heart sounds: Normal heart sounds. No murmur. No friction rub. No gallop.   Pulmonary:     Effort: Pulmonary effort is normal. No respiratory distress.     Breath sounds: No wheezing, rhonchi or rales.  Chest:     Chest wall: No tenderness.  Abdominal:     General: Bowel sounds are normal. There is no distension.     Palpations: Abdomen is soft.     Tenderness: There is no abdominal tenderness. There is no right CVA tenderness, left CVA tenderness or guarding.  Musculoskeletal: Normal range of motion.  Neurological:     General: No focal deficit present.     Mental Status: She is alert and oriented to person, place, and time. Mental status is at baseline.     Assessment/Plan:  Principal Problem:   Acute blood loss anemia Active Problems:   Coronary artery disease   Obesity,  Class III, BMI 40-49.9 (morbid obesity) (HCC)   CKD (chronic kidney disease) stage 4, GFR 15-29 ml/min (HCC)   Acute on chronic renal failure (HCC)   Iron deficiency anemia   GI bleed   Esophageal stricture   Diabetic gastroparesis (HCC)   Heme positive stool  Patient is 68yo female with PMHx of CAD s/p CABG in 203, CKD stage 4, colonic AVMs presenting with acute on chronic anemia and AKI. EGD on 8/20 negative for acute upper GI bleeding. Pt s/p 2u pRBC with appropriate response in Hb. 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   Acute blood loss anemia on chronic anemia: Patient has known history of colonic AVMs with baseline Hb 9-11 presented with Hb 7.3, dropped to 7.0. EGD negative for acute upper GI bleeding. Colonoscopy deferred unless overt bleeding.  Iron studies 8/19: Fe 19, Ferritin 14,TIBC 307, Saturation ratio 6%, consistent with IDA.  Patient given IV feraheme on 8/19 and ESA on 8/20. Patient s/p 2u pRBC with appropriate Hb response. Patient Hb 7.5 this AM (1g decrease from yesterday) after tunnel placement and initial HD session yesterday. Patient has  not had a BM yet and no bleeding noted from IV sites or catheter. She remains asymptomatic. Will continue to monitor.  - Continue to monitor with CBC - Continue Protonix 40mg  qd  Fever of unknown etiology : Patient continues to have fevers with Tmax 101.9 overnight and WBC 10.8 this AM. UA on admission with moderate leukocytes and bacteria.  Repeat UA with small leukocytes and bacteria present but no WBC noted in urine. CXR this morning due to hypoxemia overnight requiring 2L [place] showing mild interstitial pulmonary edema. On examination, lung sounds remain clear to auscultation bilaterally. No growth on urine culture and blood cultures negative to date. Patient remains asymptomatic. Will hold off on abx at this point.   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 Renal function not improved despite Foley or fluid challenge.FENa 1.8% suggests possible intrinsic etiology. Patient had non-tunneled catheter placed yesterday with IR and first session of HD with 1kg UF. Patient for HD again today.  Per nephrology recommendations, Foley to be discontinued and monitored for urinary retention, decrease of gabapentin from 600mg  to 300mg  qd. Patient will need vein mapping for dialysis access.  Supportive care with hemodynamics correction and volume expanding with pRBCs.   - Renal diet  - NaHCO3 650mg  tid  - Renal function panel -Continue strict I's/O, Foley  Mechanical falls w/o syncope: Patient reports two episodes of mechanical falls where she tripped on the bathroom rug on Sunday and again on Monday. Patient denies any dizziness or lightheadedness prior to these episodes. Patient reports a few episodes of lightheadedness immediately upon standing, requiring her to pause before proceeding to walk. She also denies any head trauma or loss of consciousness. She reports landing on her knee and back. No signs of musculoskeletal trauma on imaging studies; however continued hip pain with bruising on left hip noted.No falls since being in hospital.  - Continue to monitor - PT/OT evaluation   Insulin dependent diabetes mellitus: Patient on Lantus 21U + novolog 3U tid at home. Last HbA1c 6.6 on 03/2019.  - Novolog 3U tid + SSI  - CBG monitoring  Hypertension, HFmrEF: EF on Echo 40-45% on TTE in April 2020.  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 Recommend followup by ultrasound in 3 years. - Continue to monitor  Hypercholesterolemia: - Continue atorvastatin 40mg  qd  Hx of depression: - Continue Wellbutrin 150mg   bid  Hx of gout: - Continue Allopurinol 100mg  qd  Diet:Renal diet Fluids:LR 125 ml/hr Code: Full code DVT Prophylaxis: SCDs  Dispo: Anticipated discharge in approximately 1-2 day(s).   Harvie Heck, MD  Internal Medicine, PGY-1  08/20/2019, 9:24 AM Pager: [phone]

## 2019-08-20 NOTE — Progress Notes (Signed)
Patient unable to void after discontinuation of foley catheter. Bladder scan done ,obtained 427 ml. Patient has no complaints of any pain or discomfort at this time. Refused to walk and sit in the bedside commode.Verbalized I don't feel like urinating, I will tell you when I feel like it". MD paged,awaiting to call back.

## 2019-08-20 NOTE — Progress Notes (Signed)
I and O Cath done,obtained 371ml.

## 2019-08-21 ENCOUNTER — Encounter (HOSPITAL_COMMUNITY): Payer: Medicare Other

## 2019-08-21 DIAGNOSIS — R339 Retention of urine, unspecified: Secondary | ICD-10-CM

## 2019-08-21 LAB — RENAL FUNCTION PANEL
Albumin: 2.6 g/dL — ABNORMAL LOW (ref 3.5–5.0)
Anion gap: 10 (ref 5–15)
BUN: 91 mg/dL — ABNORMAL HIGH (ref 8–23)
CO2: 22 mmol/L (ref 22–32)
Calcium: 8.7 mg/dL — ABNORMAL LOW (ref 8.9–10.3)
Chloride: 109 mmol/L (ref 98–111)
Creatinine, Ser: 5.21 mg/dL — ABNORMAL HIGH (ref 0.44–1.00)
GFR calc Af Amer: 9 mL/min — ABNORMAL LOW (ref >60)
GFR calc non Af Amer: 8 mL/min — ABNORMAL LOW (ref >60)
Glucose, Bld: 88 mg/dL (ref 70–99)
Phosphorus: 4.2 mg/dL (ref 2.5–4.6)
Potassium: 4 mmol/L (ref 3.5–5.1)
Sodium: 141 mmol/L (ref 135–145)

## 2019-08-21 LAB — CBC WITH DIFFERENTIAL/PLATELET
Abs Immature Granulocytes: 0.15 10*3/uL — ABNORMAL HIGH (ref 0.00–0.07)
Basophils Absolute: 0 10*3/uL (ref 0.0–0.1)
Basophils Relative: 0 %
Eosinophils Absolute: 0.5 10*3/uL (ref 0.0–0.5)
Eosinophils Relative: 5 %
HCT: 25.6 % — ABNORMAL LOW (ref 36.0–46.0)
Hemoglobin: 7.6 g/dL — ABNORMAL LOW (ref 12.0–15.0)
Immature Granulocytes: 1 %
Lymphocytes Relative: 11 %
Lymphs Abs: 1.2 10*3/uL (ref 0.7–4.0)
MCH: 24.4 pg — ABNORMAL LOW (ref 26.0–34.0)
MCHC: 29.7 g/dL — ABNORMAL LOW (ref 30.0–36.0)
MCV: 82.1 fL (ref 80.0–100.0)
Monocytes Absolute: 0.9 10*3/uL (ref 0.1–1.0)
Monocytes Relative: 8 %
Neutro Abs: 7.8 10*3/uL — ABNORMAL HIGH (ref 1.7–7.7)
Neutrophils Relative %: 75 %
Platelets: 315 10*3/uL (ref 150–400)
RBC: 3.12 MIL/uL — ABNORMAL LOW (ref 3.87–5.11)
RDW: 19.9 % — ABNORMAL HIGH (ref 11.5–15.5)
WBC: 10.6 10*3/uL — ABNORMAL HIGH (ref 4.0–10.5)
nRBC: 1.2 % — ABNORMAL HIGH (ref 0.0–0.2)

## 2019-08-21 LAB — GLUCOSE, CAPILLARY
Glucose-Capillary: 106 mg/dL — ABNORMAL HIGH (ref 70–99)
Glucose-Capillary: 206 mg/dL — ABNORMAL HIGH (ref 70–99)
Glucose-Capillary: 160 mg/dL — ABNORMAL HIGH (ref 70–99)
Glucose-Capillary: 255 mg/dL — ABNORMAL HIGH (ref 70–99)

## 2019-08-21 MED ORDER — SODIUM CHLORIDE 0.9% IV SOLUTION: Freq: Once | INTRAVENOUS | Status: DC

## 2019-08-21 NOTE — Progress Notes (Signed)
  Date: 08/21/2019  Patient name: [name] [name]  Medical record number: [id number]  Date of birth: [birth year]   I have seen and evaluated this patient and I have discussed the plan of care with the house staff. Please see their note for complete details. I concur with their findings with the following additions/corrections:   Doing well on dialysis, plan for HD today. Fever curve improving, unclear cause, continue to hold antibiotics unless clear infection becomes evident. Hgb down to 7.5, will transfuse with goal >8. Unfortunately, still retaining urine yesterday, explained importance of not letting urine backup, will likely need in-dwelling foley for now.  Lenice Pressman, M.D., Ph.D. 08/21/2019, 2:17 PM

## 2019-08-21 NOTE — Progress Notes (Signed)
Patient voided 544ml of urine and had a BM. Performed a bladder scan and had 80ml of residual urine.

## 2019-08-21 NOTE — Progress Notes (Signed)
Subjective: Patient examined at bedside this morning. She is in no acute distress and reports no overnight events. Patient had a large bowel movement overnight; no hematochezia noted. She did not have a dialysis session yesterday and will have one today. Tmax 100 over 24 hours. She has not had any trouble breathing but prefers using the nasal cannula. She had concerns regarding dialysis outpatient and transportation issues which were addressed.   Objective:  Vital signs in last 24 hours: Vitals:   08/20/19 0831 08/20/19 1356 08/20/19 1950 08/21/19 0422  BP: (!) 151/65 139/60 129/61 (!) 157/73  Pulse: 79 79 80 84  Resp:  18 18 20   Temp:  100 F (37.8 C) 99.3 F (37.4 C) 98.7 F (37.1 C)  TempSrc:  Oral Oral Oral  SpO2:  91% 95% 95%  Weight:    126.4 kg  Height:       Physical Exam Constitutional:      Appearance: Normal appearance. She is obese.  Neck:     Comments: trilumen dialysis catheter secured in place Cardiovascular:     Rate and Rhythm: Normal rate and regular rhythm.     Pulses: Normal pulses.     Heart sounds: Normal heart sounds. No murmur. No friction rub. No gallop.   Pulmonary:     Effort: Pulmonary effort is normal. No respiratory distress.     Breath sounds: Normal breath sounds. No wheezing, rhonchi or rales.  Abdominal:     General: Bowel sounds are normal. There is no distension.     Palpations: Abdomen is soft. There is no mass.     Tenderness: There is no abdominal tenderness.  Neurological:     General: No focal deficit present.     Mental Status: She is alert and oriented to person, place, and time. Mental status is at baseline.     Motor: No weakness.     Assessment/Plan:  Patient is 68yo female with PMHx of CAD s/p CABG in 203, CKD stage 4, colonic AVMs presenting with acute on chronic anemia and AKI. EGD on 8/20 negative for acute upper GI bleeding. Pt s/p 2u pRBCwith appropriate response in Hb. 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 Pt Hb 7.6 this AM. Will transfuse for goal >8.    Acute blood loss anemia on chronic anemia: Patient has known history of colonic AVMs with baseline Hb 9-11 presented with Hb 7.3, dropped to 7.0.EGD negative for acute upper GI bleeding. Colonoscopy deferred unless overt bleeding.Iron studies 8/19: Fe 19, Ferritin 14,TIBC 307, Saturation ratio 6%, consistent with IDA. Patient given IV feraheme on 8/19 and ESA on 8/20. Patient s/p 2u pRBC with appropriate Hb response.Patient Hb 7.6  this AM (7.5 yesterday) Patient has had a BM without hematochezia. She remains asymptomatic. Will transfuse 1u pRBC with Hb goal >8,   - Transfuse 1u pRBC - Post transfusion H/H  - Continue to monitor with CBC - Continue Protonix 40mg  qd  Fever of unknown etiology:Patient with Tmax 100 over past 24 hours (improved from prior) and WBC 10.6 this AM. UA on admission with moderate leukocytes and bacteria.  Repeat UA with small leukocytes and bacteria present but no WBC noted in urine. Of note, patient has recurrent urinary retention. Mild interstitial pulmonary edema noted on CXR. On examination, lung sounds remain clear to auscultation bilaterally. No growth on urine culture and blood cultures negative to date. Patient  remains asymptomatic. Will hold off on abx at this point.   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 Renal function not improved despite Foley or fluid challenge.FENa 1.8% suggests possible intrinsic etiology.Patient had non-tunneled catheter placed on 8/21 with IR and first session of HD with 1kg UF. Patient for HD again today.  Per nephrology recommendations, Foley to be discontinued and monitored for urinary retention, decrease of gabapentin from 600mg  to 300mg  qd. Patient will need vein mapping for dialysis access.   Supportive care with hemodynamics correction and volume expanding with pRBCs.   - Renal diet  - NaHCO3 650mg  tid  - Renal function panel -Continue strict I's/O - Gabapentin 300mg  qd for neuropathy  Urinary retention:  Patient with recurrent urinary retention after Foley catheter removal. Most recently, Foley catheter removed yesterday, 46mL urine noted on bladder scan after 6 hours. 350 mL removed via I+O cath. Overnight, patient voided 33mL with bladder scan this AM of 380mL. Will continue to monitor but if continued urinary retention, will require Foley to be reinserted.   - Continue to monitor  - Foley cath   Mechanical falls w/o syncope: Patient reports two episodes of mechanical falls where she tripped on the bathroom rug on Sunday and again on Monday. Patient denies any dizziness or lightheadedness prior to these episodes. Patient reports a few episodes of lightheadedness immediately upon standing, requiring her to pause before proceeding to walk. She also denies any head trauma or loss of consciousness. She reports landing on her knee and back. No signs of musculoskeletal trauma on imaging studies; however continued hip pain with bruising on left hip noted.No falls since being in hospital.PT/OT eval recommended for home health PT and d/c with rolling walker.   - Continue to monitor - Home health PT   Insulin dependent diabetes mellitus: Patient on Lantus 21U + novolog 3U tid at home. Last HbA1c 6.6 on 03/2019.  - Novolog 3U tid + SSI  - CBG monitoring  Hypertension, HFmrEF: EF on Echo 40-45% on TTE in April 2020. 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 Recommend followup by ultrasound in 3 years. - Continue to monitor  Hypercholesterolemia: - Continueatorvastatin 40mg  qd  Hx of depression: - ContinueWellbutrin 150mg  bid  Hx of gout: -  ContinueAllopurinol 100mg  qd  Diet:Renal diet Fluids:LR 125 ml/hr Code: Full code DVT Prophylaxis: SCDs  Dispo: Anticipated discharge in approximately 1-2 day(s).   Harvie Heck, MD  Internal Medicine, PGY-1 08/21/2019, 9:53 AM Pager: [phone]

## 2019-08-21 NOTE — Plan of Care (Signed)
  Problem: Bowel/Gastric: Goal: Will show no signs and symptoms of gastrointestinal bleeding Outcome: Progressing   

## 2019-08-21 NOTE — Progress Notes (Signed)
[facility] Progress Note  Name: [name] [name] MRN: [id number] DOB: 10-Jan-1951  Chief Complaint:  Falls at home   Subjective:  Her second dialysis treatment was rescheduled to today due to patient volume.  Last HD on 8/21 with 1 kg UF.  States first treatment went ok.  Had straight cath with 350 mL per charting.  had 850 mL UOP over 8/22.     Review of systems:    Denies n/v Denies shortness of breath  Denies chest pain  ----------- Background on consult:  [name] [name] is a 42F with a PMH significant for CKD IV, HTN, HLD, chronic combined systolic and diastolic CHF, known colonic AVMs, CAD s/p CABG, h/o CVA, DM II who is now seen in consultation at the request of Dr Rebeca Alert for evaluation and recommendations surrounding AKI on CKD.  Briefly, pt experienced 2 mechanical falls the days prior to admission.  She was noted to have hgb 7.3 down from 9.5 in June 2020, BUN 132, Cr of 6.11 (baseline 3.57 05/2019).  FOBT +, underwent EGD today showing esophagitis with stricture.  C-scope not performed d/t concerns over prep and AKI.  In this setting we are asked to see.  Pt currently getting 1 u PRBCs.  No complaints.  Feeling OK although a little sleepy.  No f/c, n/v, SOB, CP.  No lethargy, hiccups, or loss of appetite.  No jerking movements or asterixis.  She last saw Dr. Joelyn Oms in January where she was found to be doing well and without significant complaint.  Was fluid challenged and had Foley placed; Cr down to 5.89 with Bun 127 on consult    Intake/Output Summary (Last 24 hours) at 08/21/2019 1505 Last data filed at 08/21/2019 1444 Gross per 24 hour  Intake 1404 ml  Output 1151 ml  Net 253 ml    Vitals:  Vitals:   08/20/19 1950 08/21/19 0422 08/21/19 1123 08/21/19 1249  BP: 129/61 (!) 157/73 (!) 141/63 (!) 141/74  Pulse: 80 84 84 81  Resp: _0 Temp: 99.3 F (37.4 C) 98.7 F (37.1 C) 100 F (37.8 C) 99.6 F (37.6 C)  TempSrc: Oral Oral Oral Oral  SpO2: 95% 95%  97% 96%  Weight:  126.4 kg    Height:         Physical Exam:  General adult female in bed in no acute distress HEENT NCAT/ EOMI Neck supple trachea midline Lungs clear to auscultation bilaterally normal work of breathing at rest ; room air  Heart RRR no rub Abdomen soft nontender nondistended Extremities trace edema  Psych normal mood and affect Neuro - alert and oriented x 3 conversant  GU - has foley  Access: RIJ nontunneled dialysis catheter   Medications reviewed   Labs:  BMP Latest Ref Rng & Units 08/21/2019 08/20/2019 08/19/2019  Glucose 70 - 99 mg/dL 88 164(H) 128(H)  BUN 8 - 23 mg/dL 91(H) 86(H) 123(H)  Creatinine 0.44 - 1.00 mg/dL 5.21(H) 5.17(H) 6.25(H)  Sodium 135 - 145 mmol/L 141 141 138  Potassium 3.5 - 5.1 mmol/L 4.0 4.2 4.8  Chloride 98 - 111 mmol/L 109 107 109  CO2 22 - 32 mmol/L 22 20(L) 16(L)  Calcium 8.9 - 10.3 mg/dL 8.7(L) 8.5(L) 8.5(L)    Assessment/Plan:   1.  AKI on CKD IV/V: pt with baseline Cr 3.57 with eGFR 14 in 05/2019.  Increased to 6.11-->5.89 in the setting of GI bleed, possibly from colonic AVM.  She is azotemic (as  expected in GIB) but not uremic at present.  She appears to have had CKD progression to likely ESRD.  Note baseline GFR 14 most recently in June.  Initiated HD on 8/21 after nontunneled catheter placement with IR.   ---------------------- - Will perform HD today, 8/23  (2nd treatment) and plan for the following treatment on 8/25, Tues - replace HD catheter dressing per nursing; appreciate nursing assistance - as below, repeat post-void bladder scan and replace foley if over 300 mL retained  - will likely need CLIP initiated as baseline GFR 14 prior to this presentation  - vein mapping is ordered  - Note currently with nontunneled catheter due to fever  2. Anemia: blood loss with likely anemia of chronic disease as well.  FOBT positive.  GI work-up per primary team.  On feraheme, pRBCs.  On ESA (added this admission).  Anemia improved  with PRBC's  3. Urinary retention -  Repeat bladder scan and replace foley if over 300 mL retained   4. Fever - work-up per primary team.  Blood cultures NGTD.  Urine culture no growth with bacturia.  Abx per primary team   5.  HTN: acceptable control  6. Metabolic acidosis - transitioned to HD for management    7.  DM: per primary team  8.  Hx Falls at home -noted.  Azotemia without overt uremia.  Other work-up per primary team.  Note UDS positive cocaine  9. Aortic atherosclerosis. 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  Per radiology recommend followup by ultrasound in 3 years.  [name] Desanctis, MD 08/21/2019 3:05 PM

## 2019-08-22 ENCOUNTER — Inpatient Hospital Stay (HOSPITAL_COMMUNITY): Payer: Medicare Other

## 2019-08-22 DIAGNOSIS — N186 End stage renal disease: Secondary | ICD-10-CM

## 2019-08-22 LAB — RENAL FUNCTION PANEL
Albumin: 2.6 g/dL — ABNORMAL LOW (ref 3.5–5.0)
Anion gap: 11 (ref 5–15)
BUN: 56 mg/dL — ABNORMAL HIGH (ref 8–23)
CO2: 24 mmol/L (ref 22–32)
Calcium: 8.5 mg/dL — ABNORMAL LOW (ref 8.9–10.3)
Chloride: 106 mmol/L (ref 98–111)
Creatinine, Ser: 3.66 mg/dL — ABNORMAL HIGH (ref 0.44–1.00)
GFR calc Af Amer: 14 mL/min — ABNORMAL LOW (ref >60)
GFR calc non Af Amer: 12 mL/min — ABNORMAL LOW (ref >60)
Glucose, Bld: 115 mg/dL — ABNORMAL HIGH (ref 70–99)
Phosphorus: 2.7 mg/dL (ref 2.5–4.6)
Potassium: 3.9 mmol/L (ref 3.5–5.1)
Sodium: 141 mmol/L (ref 135–145)

## 2019-08-22 LAB — GLUCOSE, CAPILLARY
Glucose-Capillary: 117 mg/dL — ABNORMAL HIGH (ref 70–99)
Glucose-Capillary: 198 mg/dL — ABNORMAL HIGH (ref 70–99)
Glucose-Capillary: 184 mg/dL — ABNORMAL HIGH (ref 70–99)
Glucose-Capillary: 212 mg/dL — ABNORMAL HIGH (ref 70–99)

## 2019-08-22 LAB — CBC
HCT: 29.1 % — ABNORMAL LOW (ref 36.0–46.0)
Hemoglobin: 8.7 g/dL — ABNORMAL LOW (ref 12.0–15.0)
MCH: 25 pg — ABNORMAL LOW (ref 26.0–34.0)
MCHC: 29.9 g/dL — ABNORMAL LOW (ref 30.0–36.0)
MCV: 83.6 fL (ref 80.0–100.0)
Platelets: 293 10*3/uL (ref 150–400)
RBC: 3.48 MIL/uL — ABNORMAL LOW (ref 3.87–5.11)
RDW: 19.8 % — ABNORMAL HIGH (ref 11.5–15.5)
WBC: 9.1 10*3/uL (ref 4.0–10.5)
nRBC: 1.9 % — ABNORMAL HIGH (ref 0.0–0.2)

## 2019-08-22 MED ORDER — HEPARIN SODIUM (PORCINE) 1000 UNIT/ML IJ SOLN
INTRAMUSCULAR | Status: AC
  Administered 2019-08-22: 1000 [IU] via INTRAVENOUS_CENTRAL
  Filled 2019-08-22: qty 3

## 2019-08-22 MED ORDER — CHLORHEXIDINE GLUCONATE CLOTH 2 % EX PADS
6.0000 | MEDICATED_PAD | Freq: Every day | CUTANEOUS | Status: DC
  Administered 2019-08-24: 6 via TOPICAL

## 2019-08-22 MED ORDER — DARBEPOETIN ALFA 100 MCG/0.5ML IJ SOSY
100.0000 ug | PREFILLED_SYRINGE | INTRAMUSCULAR | Status: DC
  Filled 2019-08-22: qty 0.5

## 2019-08-22 NOTE — Progress Notes (Signed)
Subjective: Patient evaluated at bedside this morning. She is doing well and has no acute concerns. She had hemodialysis overnight (1L out), tolerating well. Patient will get next HD session on 8/25. Patient transfused 1upRBC yesterday with appropriate response. Patient has remained afebrile for 24 hours. Discussed discharge planning with outpatient dialysis.   Objective:  Vital signs in last 24 hours: Vitals:   08/22/19 0415 08/22/19 0426 08/22/19 0503 08/22/19 0900  BP: 130/78 (!) 146/73  (!) 126/58  Pulse: 60 78  82  Resp:  (!) 22    Temp:  98.8 F (37.1 C)    TempSrc:  Oral    SpO2:  94%    Weight:  126.5 kg 126 kg   Height:       Physical Exam Constitutional:      Appearance: Normal appearance. She is obese.  Neck:     Musculoskeletal: Normal range of motion.     Comments: trilumen dialysis catheter secured in place Cardiovascular:     Rate and Rhythm: Normal rate and regular rhythm.     Pulses: Normal pulses.     Heart sounds: Normal heart sounds. No murmur. No friction rub. No gallop.   Pulmonary:     Effort: Pulmonary effort is normal. No respiratory distress.     Breath sounds: Normal breath sounds. No wheezing or rhonchi.  Chest:     Chest wall: No tenderness.  Abdominal:     General: Bowel sounds are normal.     Palpations: Abdomen is soft. There is no mass.     Tenderness: There is no abdominal tenderness. There is no guarding.  Musculoskeletal: Normal range of motion.  Neurological:     General: No focal deficit present.     Mental Status: She is alert and oriented to person, place, and time. Mental status is at baseline.     Cranial Nerves: No cranial nerve deficit.     Motor: No weakness.     Assessment/Plan:  Patient is 68yo female with PMHx of CAD s/p CABG in 203, CKD stage 4, colonic AVMs presenting with acute on chronic anemia and AKI. 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 Patient received 1u pRBC yesterday (total 3U pRBC since admission) with appropriate response in Hb.    Acute blood loss anemia on chronic anemia: Patient has known history of colonic AVMs with baseline Hb 9-11 presented with Hb 7.3, dropped to 7.0.EGD negative for acute upper GI bleeding. Colonoscopy deferred unless overt bleeding.Iron studies 8/19: Fe 19, Ferritin 14,TIBC 307, Saturation ratio 6%, consistent with IDA. Patient given IV feraheme on 8/19and ESA on 8/20.Patient s/p 2u pRBCwith appropriate Hb response.Patient Hb 7.6 yesterday and received 1U pRBC with appropriate Hb response to 8.7 this AM.   - Continue to monitor with CBC - Continue Protonix 40mg  qd  Feverof unknown etiology:Patientafebrile for 24 hours and WBC improved to 9.1 this AM. Initial and repeat UA with bacteruria but no WBC. No infectious etiology on CXR. Urine and blood cultures negative to date. Patient remains asymptomatic. Will continue to hold off on antibiotics.   - Continue to monitor  CKDVProgression to ESRD: BaselineCr3.3-3.4 BUN/Cr 132/6.1 on admission. Renal function not improved despite Foley or fluid challenge.FENa 1.8% suggests possible intrinsic etiology.Patient had non-tunneled catheter placed on 8/21. Received HD on 8/21, 8/23 and is scheduled for next session  on HD. Per nephrology, patient will need VVS consult for permanent dialysis access prior to discharge. Vein mapping completed.  Supportive care with hemodynamics correction and volume expanding with pRBCs.   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 Patient voided ~540mL overnight with PVR of 21 mL. Will continue to monitor with serial bladder scans.  - Continue to  monitor   Mechanical falls w/o syncope: Patient reports two episodes of mechanical falls where she tripped on the bathroom rug on Sunday and again on Monday. Patient denies any dizziness or lightheadedness prior to these episodes. Patient reports a few episodes of lightheadedness immediately upon standing, requiring her to pause before proceeding to walk. She also denies any head trauma or loss of consciousness. She reports landing on her knee and back. No signs of musculoskeletal trauma on imaging studies; however continued hip pain with bruising on left hip noted.No falls since being in hospital.PT/OT eval recommended for home health PT and d/c with rolling walker.   - Continue to monitor - Home health PT on d/c  Insulin dependent diabetes mellitus: Patient on Lantus 21U + novolog 3U tid at home. Last HbA1c 6.6 on 03/2019.  - Novolog 3U tid + SSI  - CBG monitoring  Hypertension, HFmrEF: EF on Echo 40-45% on TTE in April 2020. 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 Recommend followup by ultrasound in 3 years. - Continue to monitor  Hypercholesterolemia: - Continueatorvastatin 40mg  qd  Hx of depression: - ContinueWellbutrin 150mg  bid  Hx of gout: - ContinueAllopurinol 100mg  qd  Diet:Renal diet Fluids:LR 125 ml/hr Code: Full code DVT Prophylaxis: SCDs  Dispo: Anticipated discharge in approximately 1-2 day(s).   Harvie Heck, MD  Internal Medicine, PGY-1 08/22/2019, 10:05 AM Pager: [phone]

## 2019-08-22 NOTE — Progress Notes (Signed)
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  She was found to have acute on chronic kidney disease with an increase in creatinine from baseline of 3.57  05/2019 to 6.11 milligrams per deciliter on admission.  BUN increased to 132.  She is also noted to be acutely anemic and underwent EGD that showed esophagitis with strictures.  She follows with Dr. Joelyn Oms as an outpatient at [facility].  A decision was made that she had progressed to end-stage renal disease and dialysis was initiated with a non-tunneled dialysis catheter 08/19/2019.  She had a second dialysis treatment 08/21/2019 and the plan is for following dialysis treatment 08/23/2019.  Vein mapping has been ordered.  Due to recurrent fevers it was decided not to place vascular access at this time but continue to use the non-tunneled dialysis catheter.  This will need to be converted and vein and vascular consult.  Blood pressure 146/73 pulse 78 temperature 98.8 O2 sats 97% room air urine output 800 cc noted 08/21/2019 weight 126 kg  Sodium 141 potassium 3.9 chloride 106 CO2 24 BUN 56 creatinine 3.66 glucose 115 calcium 8.5 phosphorus 2.7 albumin 2.6 WBC 9.1 hemoglobin 8.7 platelets 12/31/1991  Allopurinol 100 mg daily, amlodipine 5 mg daily Lipitor 40 mg daily, Wellbutrin 150 mg twice daily, darbepoetin 100 mcg last administered 08/25/2019, gabapentin 300 mg daily, Linzess 72 mcg daily, metoprolol 12.5 mg twice daily, Protonix 40 mg daily.   Last chest x-ray 08/20/2019 showed cardiomegaly with mild interstitial edema.  CT scan of abdomen and pelvis showed no acute findings.  Aortic atherosclerosis with mild aneurysmal dilatation of the infrarenal aortic aneurysm measuring 3.1 cm small  hiatal hernia and degenerative scoliosis.  Renal ultrasound showed no evidence of hydronephrosis  Objective:  Vital signs in last 24 hours:  Temp:  [98.8 F (37.1 C)-100 F (37.8 C)] 98.8 F (37.1 C) (08/24 0426) Pulse Rate:  [60-84] 78 (08/24 0426) Resp:  [18-34] 22 (08/24 0426) BP: (121-166)/(60-118) 146/73 (08/24 0426) SpO2:  [94 %-97 %] 94 % (08/24 0426) Weight:  [126 kg-127.7 kg] 126 kg (08/24 0503)  Weight change: 1.328 kg Filed Weights   08/22/19 0150 08/22/19 0426 08/22/19 0503  Weight: 127.7 kg 126.5 kg 126 kg    Intake/Output: I/O last 3 completed shifts: In: 2301 [P.O.:1986; Blood:315] Out: 0174 [Urine:1300; Other:1000]   Intake/Output this shift:  No intake/output data recorded.  General adult female in bed in no acute distress HEENT NCAT/ EOMI Neck supple trachea midline Lungs clear to auscultation bilaterally normal work of breathing at rest ; room air  Heart RRR no rub Abdomen soft nontender nondistended Extremities trace edema  Psych normal mood and affect Neuro - alert and oriented x 3 conversant  GU - has foley  Access: RIJ nontunneled dialysis catheter    Basic Metabolic Panel: Recent Labs  Lab 08/18/19 0932 08/19/19 0339 08/20/19 0228 08/21/19 0340 08/22/19 0443  NA 139 138 141 141 141  K 4.5 4.8 4.2 4.0 3.9  CL 110 109 107 109 106  CO2 17* 16* 20* 22 24  GLUCOSE 176* 128* 164* 88 115*  BUN 124* 123* 86* 91* 56*  CREATININE 6.22* 6.25* 5.17* 5.21* 3.66*  CALCIUM 8.2* 8.5* 8.5* 8.7* 8.5*  PHOS  --   --  4.0 4.2  2.7    Liver Function Tests: Recent Labs  Lab 08/16/19 0934 08/17/19 0451 08/18/19 0932 08/19/19 0339 08/20/19 0228 08/21/19 0340 08/22/19 0443  AST 12* 12* 12* 12*  --   --   --   ALT 11 10 9 11   --   --   --   ALKPHOS 109 115 103 107  --   --   --   BILITOT 0.4 0.4 0.7 0.3  --   --   --   PROT 6.8 6.6 5.7* 6.5  --   --   --   ALBUMIN 3.2* 3.1* 2.8* 3.1* 2.7* 2.6* 2.6*   No results for input(s): LIPASE, AMYLASE in  the last 168 hours. No results for input(s): AMMONIA in the last 168 hours.  CBC: Recent Labs  Lab 08/16/19 0934  08/18/19 0932 08/18/19 1126 08/19/19 0339 08/20/19 0228 08/21/19 0340 08/22/19 0443  WBC 10.9*   < > 9.6  --  13.3* 10.8* 10.6* 9.1  NEUTROABS 8.7*  --   --   --   --   --  7.8*  --   HGB 7.3*   < > 8.0* 8.0* 8.6* 7.5* 7.6* 8.7*  HCT 25.8*   < > 26.9* 27.3* 28.1* 25.4* 25.6* 29.1*  MCV 81.1   < > 81.8  --  80.7 81.2 82.1 83.6  PLT 458*   < > 387  --  420* 354 315 293   < > = values in this interval not displayed.    Cardiac Enzymes: No results for input(s): CKTOTAL, CKMB, CKMBINDEX, TROPONINI in the last 168 hours.  BNP: Invalid input(s): POCBNP  CBG: Recent Labs  Lab 08/21/19 0630 08/21/19 1125 08/21/19 1615 08/21/19 2156 08/22/19 0619  GLUCAP 106* 206* 160* 255* 117*    Microbiology: Results for orders placed or performed during the hospital encounter of 08/16/19  SARS Coronavirus 2 [facility] order, Performed in [facility] hospital lab) Nasopharyngeal Nasopharyngeal Swab     Status: None   Collection Time: 08/16/19  2:34 PM   Specimen: Nasopharyngeal Swab  Result Value Ref Range Status   SARS Coronavirus 2 NEGATIVE NEGATIVE Final    Comment: (NOTE) If result is NEGATIVE SARS-CoV-2 target nucleic acids are NOT DETECTED. The SARS-CoV-2 RNA is generally detectable in upper and lower  respiratory specimens during the acute phase of infection. The lowest  concentration of SARS-CoV-2 viral copies this assay can detect is 250  copies / mL. A negative result does not preclude SARS-CoV-2 infection  and should not be used as the sole basis for treatment or other  patient management decisions.  A negative result may occur with  improper specimen collection / handling, submission of specimen other  than nasopharyngeal swab, presence of viral mutation(s) within the  areas targeted by this assay, and inadequate number of viral copies  (<250 copies / mL). A  negative result must be combined with clinical  observations, patient history, and epidemiological information. If result is POSITIVE SARS-CoV-2 target nucleic acids are DETECTED. The SARS-CoV-2 RNA is generally detectable in upper and lower  respiratory specimens dur ing the acute phase of infection.  Positive  results are indicative of active infection with SARS-CoV-2.  Clinical  correlation with patient history and other diagnostic information is  necessary to determine patient infection status.  Positive results do  not rule out bacterial infection or co-infection with other viruses. If result is PRESUMPTIVE POSTIVE SARS-CoV-2 nucleic acids MAY BE PRESENT.   A presumptive positive result was  obtained on the submitted specimen  and confirmed on repeat testing.  While 2019 novel coronavirus  (SARS-CoV-2) nucleic acids may be present in the submitted sample  additional confirmatory testing may be necessary for epidemiological  and / or clinical management purposes  to differentiate between  SARS-CoV-2 and other Sarbecovirus currently known to infect humans.  If clinically indicated additional testing with an alternate test  methodology [phone]) is advised. The SARS-CoV-2 RNA is generally  detectable in upper and lower respiratory sp ecimens during the acute  phase of infection. The expected result is Negative. Fact Sheet for Patients:  StrictlyIdeas.no Fact Sheet for Healthcare Providers: BankingDealers.co.za This test is not yet approved or cleared by the [place] FDA and has been authorized for detection and/or diagnosis of SARS-CoV-2 by FDA under an Emergency Use Authorization (EUA).  This EUA will remain in effect (meaning this test can be used) for the duration of the COVID-19 declaration under Section 564(b)(1) of the Act, 21 U.S.C. section 360bbb-3(b)(1), unless the authorization is terminated or revoked sooner. Performed  at [facility], [place] [address], [place], [place] [address]   Culture, Urine     Status: None   Collection Time: 08/19/19  7:12 AM   Specimen: Urine, Random  Result Value Ref Range Status   Specimen Description URINE, RANDOM  Final   Special Requests NONE  Final   Culture   Final    NO GROWTH Performed at [facility], [place] [address], [place], [place] [address]    Report Status 08/20/2019 FINAL  Final  Culture, blood (routine x 2)     Status: None (Preliminary result)   Collection Time: 08/19/19  7:14 AM   Specimen: BLOOD LEFT HAND  Result Value Ref Range Status   Specimen Description BLOOD LEFT HAND  Final   Special Requests   Final    BOTTLES DRAWN AEROBIC ONLY Blood Culture adequate volume   Culture   Final    NO GROWTH 2 DAYS Performed at [facility], [place] [address], [place], [place] [address]    Report Status PENDING  Incomplete  Culture, blood (routine x 2)     Status: None (Preliminary result)   Collection Time: 08/19/19  7:14 AM   Specimen: BLOOD LEFT HAND  Result Value Ref Range Status   Specimen Description BLOOD LEFT HAND  Final   Special Requests   Final    BOTTLES DRAWN AEROBIC ONLY Blood Culture adequate volume   Culture   Final    NO GROWTH 2 DAYS Performed at [facility], [place] [address], [place],  [address]    Report Status PENDING  Incomplete    Coagulation Studies: No results for input(s): LABPROT, INR in the last 72 hours.  Urinalysis: No results for input(s): COLORURINE, LABSPEC, PHURINE, GLUCOSEU, HGBUR, BILIRUBINUR, KETONESUR, PROTEINUR, UROBILINOGEN, NITRITE, LEUKOCYTESUR in the last 72 hours.  Invalid input(s): APPERANCEUR    Imaging: No results found.   Medications:   . ferumoxytol 510 mg (08/17/19 1239)   . sodium chloride   Intravenous Once  . allopurinol  100 mg Oral Daily  . amLODipine  5 mg Oral Daily  . atorvastatin  40 mg Oral q1800  . brinzolamide  1 drop Both Eyes BID  . buPROPion  150 mg  Oral BID  . Chlorhexidine Gluconate Cloth  6 each Topical Q0600  . Chlorhexidine Gluconate Cloth  6 each Topical Q0600  . darbepoetin (ARANESP) injection - NON-DIALYSIS  100  mcg Subcutaneous Q Thu-1800  . gabapentin  300 mg Oral Daily  . insulin aspart  0-15 Units Subcutaneous TID WC  . insulin aspart  0-5 Units Subcutaneous QHS  . insulin aspart  3 Units Subcutaneous TID WC  . latanoprost  1 drop Both Eyes QHS  . linaclotide  72 mcg Oral QAC breakfast  . metoprolol tartrate  12.5 mg Oral BID  . pantoprazole  40 mg Oral Q0600   acetaminophen **OR** acetaminophen, barrier cream, ipratropium-albuterol, lidocaine (PF), mometasone-formoterol, sodium chloride flush  Assessment/ Plan:   New end-stage renal disease with progression from stage IV/V chronic kidney disease.  Initiated hemodialysis through non-tunneled dialysis catheter placed by interventional radiology 08/19/2019.  She is scheduled for her third dialysis treatment Tuesday, 08/23/2019.  Vein mapping has been ordered.  Patient will need clip and will need vein and vascular consult for permanent dialysis access prior to discharge.  Her fever curve seems to be decreasing.  Anemia status post GI blood loss continues on Feraheme and darbepoetin.  She also received packed red blood cells total of 3 units 08/17/2019 08/18/2019 and 08/21/2019.  Urinary retention continues to bladder scan  Fever work-up per primary team blood cultures NGTD urine culture NGTD  Hypertension controlled  Metabolic acidosis transition to hemodialysis  Diabetes mellitus per primary team  History of falls at home no fractures.  Urine drug screen positive for cocaine  Aortic atherosclerosis with a 3.1 cm aneurysm follow-up ultrasound recommended for 3 years.   LOS: [place] @TODAY @8 :35 AM

## 2019-08-22 NOTE — Consult Note (Addendum)
[facility] NOTE   MRN : [id number]  Reason for Consult: ESRD Referring Physician: Dr. Justin Mend  History of Present Illness: 68 y/o female with history of CKD now ESRD.  Dialysis was initiated with a non-tunneled dialysis catheter 08/19/2019.  She had a second dialysis treatment 08/21/2019 and the plan is for following dialysis treatment 08/23/2019.  Vein mapping has been ordered.  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       Current Facility-Administered Medications  Medication Dose Route Frequency Provider Last Rate Last Dose  . acetaminophen (TYLENOL) tablet 650 mg  650 mg Oral Q6H PRN Irene Shipper, MD   650 mg at 08/19/19 2126   Or  . acetaminophen (TYLENOL) suppository 650 mg  650 mg Rectal Q6H PRN Irene Shipper, MD      . allopurinol (ZYLOPRIM) tablet 100 mg  100 mg Oral Daily Irene Shipper, MD   100 mg at 08/22/19 1937  . amLODipine (NORVASC) tablet 5 mg  5 mg Oral Daily Irene Shipper, MD   5 mg at 08/22/19 9024  . atorvastatin (LIPITOR) tablet 40 mg  40 mg Oral q1800 Irene Shipper, MD   40 mg at 08/21/19 1807  . barrier cream (non-specified) 1 application  1 application Topical BID PRN Guadlupe Spanish B, MD      . brinzolamide (AZOPT) 1 % ophthalmic suspension 1 drop  1 drop Both Eyes BID Irene Shipper, MD   1 drop at 08/22/19 0973  . buPROPion (WELLBUTRIN SR) 12 hr tablet 150 mg  150 mg Oral BID Irene Shipper, MD   150 mg at 08/22/19 5329  . Chlorhexidine Gluconate Cloth 2 % PADS 6 each  6 each Topical Q0600 Edrick Oh, MD      . Derrill Memo ON 08/25/2019] Darbepoetin Alfa (ARANESP) injection 100 mcg  100 mcg Intravenous Q Thu-HD Harrie Jeans C, MD      . gabapentin (NEURONTIN) capsule 300 mg  300 mg Oral Daily [name], MD   300 mg at 08/22/19 9242  . insulin aspart (novoLOG) injection 0-15 Units  0-15 Units  Subcutaneous TID WC Irene Shipper, MD   3 Units at 08/22/19 1154  . insulin aspart (novoLOG) injection 0-5 Units  0-5 Units Subcutaneous QHS Irene Shipper, MD   3 Units at 08/21/19 2215  . insulin aspart (novoLOG) injection 3 Units  3 Units Subcutaneous TID WC Irene Shipper, MD   3 Units at 08/22/19 1154  . ipratropium-albuterol (DUONEB) 0.5-2.5 (3) MG/3ML nebulizer solution 3 mL  3 mL Nebulization Q6H PRN Irene Shipper, MD      . latanoprost (XALATAN) 0.005 % ophthalmic solution 1 drop  1 drop Both Eyes QHS Irene Shipper, MD   1 drop at 08/21/19 2214  . lidocaine (PF) (XYLOCAINE) 1 % injection    PRN Aletta Edouard, MD   5 mL at 08/19/19 1359  . linaclotide (LINZESS) capsule 72 mcg  72 mcg Oral QAC breakfast Irene Shipper, MD   72 mcg at 08/22/19 1153  . metoprolol tartrate (LOPRESSOR) tablet 12.5 mg  12.5 mg Oral BID Irene Shipper, MD   12.5 mg at 08/22/19 6834  . mometasone-formoterol (DULERA) 100-5 MCG/ACT inhaler 2 puff  2 puff Inhalation Q4H PRN Irene Shipper, MD      . pantoprazole ([place])  EC tablet 40 mg  40 mg Oral Q0600 Irene Shipper, MD   40 mg at 08/22/19 0732  . sodium chloride flush (NS) 0.9 % injection 10-40 mL  10-40 mL Intracatheter PRN Oda Kilts, MD        Pt meds include: Statin :Yes Betablocker: Yes ASA: Yes Other anticoagulants/antiplatelets: none  Past Medical History:  Diagnosis Date  . Acute respiratory failure with hypoxia and hypercapnia ([place]) 06/01/2019  . Anemia 08/2016  . Angiodysplasia of colon   . Arthritis of left shoulder region 03/23/2013  . Cardiomegaly 05/2019  . Chronic combined systolic and diastolic CHF (congestive heart failure) ([facility])    a. EF 40-45%, mild LVH, mid apicalanteroseptal and apical HK.  . CKD (chronic kidney disease), stage III ([place])   . Cocaine abuse ([place])    crack cocaine heavily until 2008 then sporadic use since then  . Coronary artery disease    a. 06/2012 NSTEMI/CABG x 3 (LIMA->LAD, VG->OM2, VG->LCX);  b. 04/2015  MV: EF<30%, mid ant, apicalanterior, apical infarct;  c. 04/2015 Cath: LM nl, LAD 90p, LCX 74m, OM1 min irregs, RCA mild dzs, LIMA->LAD nl w/ dist LAD dzs, VG->OM2 nl, VG->LCX nl-->Med Rx.  . CVA (cerebral infarction)    a. right internal capsule stroke in 12/2006  . Diabetes mellitus    diagnosed in 2008  . Essential hypertension   . Glaucoma   . Gout   . HFrEF (heart failure with reduced ejection fraction) ([place])   . Hyperlipidemia   . Hyperparathyroidism, secondary renal ([place])   . Left-sided sensory deficit present   . Obesity, morbid ([place])   . Pneumonia   . Pulmonary edema 05/2019  . PVD (peripheral vascular disease) ([place])    a. 06/2012 ABI's: R - 0.73, L - 0.71.  Marland Kitchen Renal mass, right   . Shortness of breath dyspnea   . Stroke ([place])   . Thyroid nodule    FNA in 6384 showed follicular cells but not definate neoplasm  . Tobacco abuse   . Trichomoniasis     Past Surgical History:  Procedure Laterality Date  . CARDIAC CATHETERIZATION    . CARDIAC CATHETERIZATION N/A 05/17/2015   Procedure: Left Heart Cath and Cors/Grafts Angiography;  Surgeon: [name], MD;  Location: [place] [facility];  Service: Cardiovascular;  Laterality: N/A;  . COLONOSCOPY WITH PROPOFOL N/A 04/21/2016   Procedure: COLONOSCOPY WITH PROPOFOL;  Surgeon: Irene Shipper, MD;  Location: [place];  Service: Endoscopy;  Laterality: N/A;  . CORONARY ARTERY BYPASS GRAFT  07/09/2012   Procedure: CORONARY ARTERY BYPASS GRAFTING (CABG);  Surgeon: Ivin Poot, MD;  Location: [place];  Service: Open Heart Surgery;  Laterality: N/A;  . ESOPHAGOGASTRODUODENOSCOPY N/A 04/20/2016   Procedure: ESOPHAGOGASTRODUODENOSCOPY (EGD);  Surgeon: Gatha Mayer, MD;  Location[facility]COPY;  Service: Endoscopy;  Laterality: N/A;  . ESOPHAGOGASTRODUODENOSCOPY (EGD) WITH PROPOFOL N/A 08/17/2019   Procedure: ESOPHAGOGASTRODUODENOSCOPY (EGD) WITH PROPOFOL;  Surgeon: Irene Shipper, MD;  Location: [facility] ENDOSCOPY;  Service: Endoscopy;   Laterality: N/A;  . IR FLUORO GUIDE CV LINE RIGHT  08/19/2019  . IR US GUIDE VASC ACCESS RIGHT  08/19/2019  . LEFT HEART CATHETERIZATION WITH CORONARY ANGIOGRAM N/A 06/29/2012   Procedure: LEFT HEART CATHETERIZATION WITH CORONARY ANGIOGRAM;  Surgeon: Peter M Martinique, MD;  Location: [facility] CATH LAB;  Service: Cardiovascular;  Laterality: N/A;  . STERNAL WOUND DEBRIDEMENT  08/17/2012   Procedure: STERNAL WOUND DEBRIDEMENT;  Surgeon: Ivin Poot, MD;  Location: [place];  Service: Thoracic;  Laterality: N/A;  wound vac application  . STERNAL WOUND DEBRIDEMENT  08/24/2012   Procedure: STERNAL WOUND DEBRIDEMENT;  Surgeon: Ivin Poot, MD;  Location: [place];  Service: Thoracic;  Laterality: N/A;  . STERNAL WOUND DEBRIDEMENT  09/01/2012   Procedure: STERNAL WOUND DEBRIDEMENT;  Surgeon: Ivin Poot, MD;  Location: [place];  Service: Thoracic;  Laterality: N/A;  . STERNAL WOUND DEBRIDEMENT  09/20/2012   Procedure: STERNAL WOUND DEBRIDEMENT;  Surgeon: Ivin Poot, MD;  Location: [facility] OR;  Service: Thoracic;  Laterality: N/A;  wound vac change    Social History Social History   Tobacco Use  . Smoking status: Current Some Day Smoker    Packs/day: 0.25    Years: 50.00    Pack years: 12.50    Types: Cigarettes  . Smokeless tobacco: Never Used  Substance Use Topics  . Alcohol use: No    Alcohol/week: 0.0 standard drinks  . Drug use: Yes    Types: Cocaine    Comment: pt denies at current time    Family History Family History  Problem Relation Age of Onset  . Diabetes Mother   . Hypertension Mother   . Cancer Mother   . Hyperlipidemia Father   . Hypertension Father   . Kidney disease Father   . Gout Father   . Cerebrovascular Accident Father   . Other Other        no known family CAD    Allergies  Allergen Reactions  . Naproxen Rash     REVIEW OF SYSTEMS  General: [ ]  Weight loss, [ ]  Fever, [ ]  chills Neurologic: [ ]  Dizziness, [ ]  Blackouts, [ ]  Seizure [ ]  Stroke, [ ]  "Mini  stroke", [ ]  Slurred speech, [ ]  Temporary blindness; [ ]  weakness in arms or legs, [ ]  Hoarseness [ ]  Dysphagia Cardiac: [ ]  Chest pain/pressure, [ ]  Shortness of breath at rest [ ]  Shortness of breath with exertion, [ ]  Atrial fibrillation or irregular heartbeat  Vascular: [ ]  Pain in legs with walking, [ ]  Pain in legs at rest, [ ]  Pain in legs at night,  [ ]  Non-healing ulcer, [ ]  Blood clot in vein/DVT,   Pulmonary: [ ]  Home oxygen, [ ]  Productive cough, [ ]  Coughing up blood, [ ]  Asthma,  [ ]  Wheezing [ ]  COPD Musculoskeletal:  [ ]  Arthritis, [ ]  Low back pain, [ ]  Joint pain Hematologic: [ ]  Easy Bruising, [ ]  Anemia; [ ]  Hepatitis Gastrointestinal: [ ]  Blood in stool, [ ]  Gastroesophageal Reflux/heartburn, Urinary: x[ ]  chronic Kidney disease, [x ] on HD - [ ]  MWF or [ ]  TTHS, [ ]  Burning with urination, [ ]  Difficulty urinating Skin: [ ]  Rashes, [ ]  Wounds Psychological: [ ]  Anxiety, [ ]  Depression  Physical Examination Vitals:   08/22/19 0426 08/22/19 0503 08/22/19 0900 08/22/19 1151  BP: (!) 146/73  (!) 126/58 132/85  Pulse: 78  82 74  Resp: (!) 22   17  Temp: 98.8 F (37.1 C)   98.6 F (37 C)  TempSrc: Oral   Oral  SpO2: 94%   98%  Weight: 126.5 kg 126 kg    Height:       Body mass index is 42.24 kg/m.  General:  WDWN in NAD HENT: WNL Eyes: Pupils equal Pulmonary: normal non-labored breathing , without Rales, rhonchi, pos. wheezing Cardiac: RRR, without  Murmurs, rubs or gallops; No carotid bruits Skin: no rashes, ulcers noted;  no Gangrene , no cellulitis; no open wounds;   Vascular Exam/Pulses:palpable radial pulses B UE   Musculoskeletal: no muscle wasting or atrophy; no edema  Neurologic:patient would open eyes, but would not  Verbally respond. SENSATION: normal; MOTOR FUNCTION: 5/5 Symmetric    Significant Diagnostic Studies: CBC Lab Results  Component Value Date   WBC 9.1 08/22/2019   HGB 8.7 (L) 08/22/2019   HCT 29.1 (L) 08/22/2019   MCV 83.6  08/22/2019   PLT 293 08/22/2019    BMET    Component Value Date/Time   NA 141 08/22/2019 0443   K 3.9 08/22/2019 0443   CL 106 08/22/2019 0443   CO2 24 08/22/2019 0443   GLUCOSE 115 (H) 08/22/2019 0443   BUN 56 (H) 08/22/2019 0443   CREATININE 3.66 (H) 08/22/2019 0443   CREATININE 2.62 (H) 04/29/2016 1629   CALCIUM 8.5 (L) 08/22/2019 0443   GFRNONAA 12 (L) 08/22/2019 0443   GFRAA 14 (L) 08/22/2019 0443   Estimated Creatinine Clearance: 20.6 mL/min (A) (by C-G formula based on SCr of 3.66 mg/dL (H)).  COAG Lab Results  Component Value Date   INR 1.2 04/28/2019   INR 1.17 09/23/2016   INR 1.12 04/17/2016     Non-Invasive Vascular Imaging:  Acceptable cephalic vein size B UE on vein mapping > .30 cm   ASSESSMENT/PLAN:  Currently ESRD on HD with right IJ temp cath She will be scheduled for [facility] exchange and UE AV fistula placement pending domoninat hand, both cephalic veins are acceptable for access per vein mapping results.   Roxy Horseman 08/22/2019 3:54 PM  Agree with above.  Pt is right handed.  Will plan to place tunneled dialysis catheter and left brachial cephalic AVF later this week.  Will firm up OR dates tomorrow.  Please remove IV from left arm  Risks benefits possible complications including bleeding infection ischemic steal non maturation thrombosis discussed with pt  Ruta Hinds, MD [facility] [place]: [phone] Pager: [phone]

## 2019-08-22 NOTE — Progress Notes (Signed)
Physical Therapy Treatment Patient Details Name: [name] [name] MRN: [id number] DOB: [birth year] Today's Date: 08/22/2019    History of Present Illness Patient is a 68 year old female admitted with anemia.PMH to include DM, CAD s/p CABG, CKD, HF.    PT Comments    Pt reports her husband would not let her lie around at home so she is willing to get moving. Pt initially reporting "I feel strong as an ox" but unwilling to attempt stairs this session and only able to progress to 60' ambulation and HEp prior to fatigue. Pt reports walking at least 100' at home and recognizes need to progress mobility acutely. Will continue to follow and recommend OOB for meals and up for toileting.   HR 86 with gait   Follow Up Recommendations  Home health PT     Equipment Recommendations  Rolling walker with 5" wheels    Recommendations for Other Services       Precautions / Restrictions Precautions Precautions: Fall    Mobility  Bed Mobility Overal bed mobility: Modified Independent Bed Mobility: Supine to Sit           General bed mobility comments: increased time with use of rail  Transfers Overall transfer level: Modified independent               General transfer comment: able to rise from bed without assist, decreased control of descent to surface  Ambulation/Gait Ambulation/Gait assistance: Min guard Gait Distance (Feet): 60 Feet Assistive device: Rolling walker (2 wheeled) Gait Pattern/deviations: Step-through pattern;Decreased stride length;Trunk flexed   Gait velocity interpretation: 1.31 - 2.62 ft/sec, indicative of limited community ambulator General Gait Details: cues for proximity to Rw with limited tolerance due to fatigue.   Stairs             Wheelchair Mobility    Modified Rankin (Stroke Patients Only)       Balance Overall balance assessment: Needs assistance Sitting-balance support: Feet supported;No upper extremity supported Sitting  balance-Leahy Scale: Good Sitting balance - Comments: able to sit EOB without assist   Standing balance support: Bilateral upper extremity supported Standing balance-Leahy Scale: Poor                              Cognition Arousal/Alertness: Awake/alert Behavior During Therapy: WFL for tasks assessed/performed Overall Cognitive Status: Within Functional Limits for tasks assessed                                        Exercises General Exercises - Lower Extremity Long Arc Quad: AROM;Both;10 reps;Seated Hip ABduction/ADduction: AROM;Both;Seated;15 reps Hip Flexion/Marching: AROM;Both;10 reps;Seated Toe Raises: AROM;Both;15 reps;Seated Heel Raises: AROM;Both;15 reps;Seated    General Comments        Pertinent Vitals/Pain Pain Assessment: No/denies pain    Home Living                      Prior Function            PT Goals (current goals can now be found in the care plan section) Progress towards PT goals: Progressing toward goals    Frequency           PT Plan Current plan remains appropriate    Co-evaluation              AM-PAC PT "6  Clicks" Mobility   Outcome Measure  Help needed turning from your back to your side while in a flat bed without using bedrails?: A Little Help needed moving from lying on your back to sitting on the side of a flat bed without using bedrails?: A Little Help needed moving to and from a bed to a chair (including a wheelchair)?: A Little Help needed standing up from a chair using your arms (e.g., wheelchair or bedside chair)?: None Help needed to walk in hospital room?: A Little Help needed climbing 3-5 steps with a railing? : A Lot 6 Click Score: 18    End of Session Equipment Utilized During Treatment: Gait belt Activity Tolerance: Patient tolerated treatment well Patient left: in chair;with call bell/phone within reach;with chair alarm set Nurse Communication: Mobility status PT  Visit Diagnosis: Muscle weakness (generalized) (M62.81);Difficulty in walking, not elsewhere classified (R26.2);History of falling (Z91.81)     Time: 9450-3888 PT Time Calculation (min) (ACUTE ONLY): 19 min  Charges:  $Gait Training: 8-22 mins                     [name] [name], PT Acute Rehabilitation Services Pager: [phone] Office: [place] 08/22/2019, 1:37 PM

## 2019-08-22 NOTE — Progress Notes (Signed)
UE vein mapping       has been completed. Preliminary results can be found under CV proc through chart review. Timohty Renbarger, BS, RDMS, RVT   

## 2019-08-22 NOTE — Progress Notes (Signed)
  Date: 08/22/2019  Patient name: COURTENEY ALDERETE  Medical record number: [id number]  Date of birth: Dec 29, 1951   I have seen and evaluated this patient and I have discussed the plan of care with the house staff. Please see their note for complete details. I concur with their findings with the following additions/corrections:   Doing well after initiating dialysis. Next HD planned for tomorrow.   No further fevers since 8/21, cultures remain negative, possible fevers were delayed transfusion reactions? Seems reasonable to proceed with more permanent dialysis access at this point. Will also need CLIP process, and she needs transportation for dialysis.  Hgb responded appropriately to transfusion of an additional unit yesterday. 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 If Hgb drops again, will discuss timing of colonoscopy with GI.   Lenice Pressman, M.D., Ph.D. 08/22/2019, 11:06 AM

## 2019-08-22 NOTE — Care Management Important Message (Signed)
Important Message  Patient Details  Name: [name] [name] MRN: [id number] Date of Birth: 02-05-1951   Medicare Important Message Given:  Yes     Shelda Altes 08/22/2019, 12:54 PM

## 2019-08-23 ENCOUNTER — Encounter (HOSPITAL_COMMUNITY): Payer: Self-pay | Admitting: Internal Medicine

## 2019-08-23 LAB — RENAL FUNCTION PANEL
Albumin: 2.5 g/dL — ABNORMAL LOW (ref 3.5–5.0)
Anion gap: 11 (ref 5–15)
BUN: 71 mg/dL — ABNORMAL HIGH (ref 8–23)
CO2: 23 mmol/L (ref 22–32)
Calcium: 8.6 mg/dL — ABNORMAL LOW (ref 8.9–10.3)
Chloride: 105 mmol/L (ref 98–111)
Creatinine, Ser: 4.4 mg/dL — ABNORMAL HIGH (ref 0.44–1.00)
GFR calc Af Amer: 11 mL/min — ABNORMAL LOW (ref >60)
GFR calc non Af Amer: 10 mL/min — ABNORMAL LOW (ref >60)
Glucose, Bld: 124 mg/dL — ABNORMAL HIGH (ref 70–99)
Phosphorus: 3.7 mg/dL (ref 2.5–4.6)
Potassium: 4.3 mmol/L (ref 3.5–5.1)
Sodium: 139 mmol/L (ref 135–145)

## 2019-08-23 LAB — GLUCOSE, CAPILLARY
Glucose-Capillary: 113 mg/dL — ABNORMAL HIGH (ref 70–99)
Glucose-Capillary: 117 mg/dL — ABNORMAL HIGH (ref 70–99)
Glucose-Capillary: 225 mg/dL — ABNORMAL HIGH (ref 70–99)
Glucose-Capillary: 129 mg/dL — ABNORMAL HIGH (ref 70–99)

## 2019-08-23 LAB — BPAM RBC
Blood Product Expiration Date: 202009192359
ISSUE DATE / TIME: 202008240229
Unit Type and Rh: 5100

## 2019-08-23 LAB — TYPE AND SCREEN
ABO/RH(D): O POS
Antibody Screen: NEGATIVE
Unit division: 0

## 2019-08-23 LAB — CBC
HCT: 28.3 % — ABNORMAL LOW (ref 36.0–46.0)
Hemoglobin: 8.5 g/dL — ABNORMAL LOW (ref 12.0–15.0)
MCH: 25.1 pg — ABNORMAL LOW (ref 26.0–34.0)
MCHC: 30 g/dL (ref 30.0–36.0)
MCV: 83.5 fL (ref 80.0–100.0)
Platelets: 287 10*3/uL (ref 150–400)
RBC: 3.39 MIL/uL — ABNORMAL LOW (ref 3.87–5.11)
RDW: 19.8 % — ABNORMAL HIGH (ref 11.5–15.5)
WBC: 8.7 10*3/uL (ref 4.0–10.5)
nRBC: 0.7 % — ABNORMAL HIGH (ref 0.0–0.2)

## 2019-08-23 LAB — HEPATITIS B SURFACE ANTIGEN
Hepatitis B Surface Ag: NEGATIVE
Hepatitis B Surface Ag: NEGATIVE

## 2019-08-23 MED ORDER — PENTAFLUOROPROP-TETRAFLUOROETH EX AERO: 1.0000 "application " | INHALATION_SPRAY | CUTANEOUS | Status: DC | PRN

## 2019-08-23 MED ORDER — SODIUM CHLORIDE 0.9 % IV SOLN: 100.0000 mL | INTRAVENOUS | Status: DC | PRN

## 2019-08-23 MED ORDER — LIDOCAINE HCL (PF) 1 % IJ SOLN: 5.0000 mL | INTRAMUSCULAR | Status: DC | PRN

## 2019-08-23 MED ORDER — HEPARIN SODIUM (PORCINE) 1000 UNIT/ML DIALYSIS: 1000.0000 [IU] | INTRAMUSCULAR | Status: DC | PRN

## 2019-08-23 MED ORDER — ALTEPLASE 2 MG IJ SOLR: 2.0000 mg | Freq: Once | INTRAMUSCULAR | Status: DC | PRN

## 2019-08-23 MED ORDER — LIDOCAINE-PRILOCAINE 2.5-2.5 % EX CREA: 1.0000 "application " | TOPICAL_CREAM | CUTANEOUS | Status: DC | PRN

## 2019-08-23 NOTE — Progress Notes (Signed)
Occupational Therapy Treatment Patient Details Name: [name] [name] MRN: [id number] DOB: [birth year] Today's Date: 08/23/2019    History of present illness Patient is a 68 year old female admitted with anemia.PMH to include DM, CAD s/p CABG, CKD, HF.   OT comments  Patient progressing well. Requires min guard for simulated toilet transfer (to recliner) as patient declined to use RW, unsteady and reliant on at least 1 UE support.  She engaged in grooming tasks with setup assist seated.  Reports increased fatigue today, had dialysis this morning. Educated on energy conservation, fall prevention techniques (pt fell in bathroom 2x prior to admission).  Updated dc plan to [facility], will need 24/7 support.   Follow Up Recommendations  Home health OT;Supervision/Assistance - 24 hour    Equipment Recommendations  3 in 1 bedside commode(bariatric)    Recommendations for Other Services      Precautions / Restrictions Precautions Precautions: Fall Restrictions Weight Bearing Restrictions: No       Mobility Bed Mobility               General bed mobility comments: EOB upon entry   Transfers Overall transfer level: Needs assistance Equipment used: None Transfers: Sit to/from Stand;Stand Pivot Transfers Sit to Stand: Supervision Stand pivot transfers: Min guard       General transfer comment: pt declined use of RW, min guard for pivot to recliner     Balance Overall balance assessment: Needs assistance Sitting-balance support: No upper extremity supported;Feet supported Sitting balance-Leahy Scale: Good     Standing balance support: No upper extremity supported;During functional activity;Single extremity supported Standing balance-Leahy Scale: Poor Standing balance comment: declined use of RW for stand pivot transfer, min guard with 0-1 hand support dynamically                            ADL either performed or assessed with clinical judgement   ADL Overall ADL's :  Needs assistance/impaired     Grooming: Wash/dry hands;Wash/dry face;Set up;Sitting Grooming Details (indicate cue type and reason): applied lotion to B UEs/LEs too  Upper Body Bathing: Set up;Sitting               Toilet Transfer: Min Designer, jewellery Details (indicate cue type and reason): simulated to recliner, pt declined use of RW          Functional mobility during ADLs: Min guard General ADL Comments: pt limited by generalized weakness and decreased activity tolerance     Vision       Perception     Praxis      Cognition Arousal/Alertness: Awake/alert Behavior During Therapy: WFL for tasks assessed/performed Overall Cognitive Status: Within Functional Limits for tasks assessed                                          Exercises     Shoulder Instructions       General Comments reivewed home safety, energy conservation and fall prevention techniques     Pertinent Vitals/ Pain       Pain Assessment: No/denies pain  Home Living                                          Prior Functioning/Environment  Frequency  Min 2X/week        Progress Toward Goals  OT Goals(current goals can now be found in the care plan section)  Progress towards OT goals: Progressing toward goals  Acute Rehab OT Goals Patient Stated Goal: regain strength/independence OT Goal Formulation: With patient  Plan Frequency remains appropriate;Discharge plan needs to be updated    Co-evaluation                 AM-PAC OT "6 Clicks" Daily Activity     Outcome Measure   Help from another person eating meals?: None Help from another person taking care of personal grooming?: A Little Help from another person toileting, which includes using toliet, bedpan, or urinal?: A Little Help from another person bathing (including washing, rinsing, drying)?: A Lot Help from another person to put on and taking off  regular upper body clothing?: A Little Help from another person to put on and taking off regular lower body clothing?: A Lot 6 Click Score: 17    End of Session    OT Visit Diagnosis: Muscle weakness (generalized) (M62.81);Unsteadiness on feet (R26.81)   Activity Tolerance Patient tolerated treatment well   Patient Left in chair;with call bell/phone within reach;with chair alarm set   Nurse Communication Mobility status        Time: 6712-4580 OT Time Calculation (min): 22 min  Charges: OT Treatments $Self Care/Home Management : 8-22 mins  Delight Stare, [place] Pager [phone] Office [phone]    Delight Stare 08/23/2019, 4:53 PM

## 2019-08-23 NOTE — Progress Notes (Addendum)
Subjective: Patient seen while undergoing HD this AM. She was feeling well. We discussed plans for tunneled HD cath and fistula placement this week. She has no questions or concerns this morning. She notes her significant other is coming to visit her.  Objective:  Vital signs in last 24 hours: Vitals:   08/23/19 0930 08/23/19 1000 08/23/19 1048 08/23/19 1144  BP: (!) 162/72 (!) 156/78 (!) 137/94 (!) 147/71  Pulse: 81 80 66 87  Resp:   16 16  Temp:   99.1 F (37.3 C) 99.6 F (37.6 C)  TempSrc:   Oral Oral  SpO2:   99% 93%  Weight:   124 kg   Height:       Physical Exam Constitutional:      Appearance: Normal appearance. She is obese.  Neck:     Musculoskeletal: Normal range of motion.     Comments: trilumen dialysis catheter secured in place Cardiovascular:     Rate and Rhythm: Normal rate and regular rhythm.     Pulses: Normal pulses.     Heart sounds: Normal heart sounds. No murmur. No friction rub. No gallop.   Pulmonary:     Effort: Pulmonary effort is normal. No respiratory distress.     Breath sounds: Normal breath sounds. No wheezing or rhonchi.  Chest:     Chest wall: No tenderness.  Abdominal:     General: Bowel sounds are normal.     Palpations: Abdomen is soft. There is no mass.     Tenderness: There is no abdominal tenderness. There is no guarding.  Musculoskeletal: Normal range of motion.  Skin:    General: Skin is warm and dry.  Neurological:     General: No focal deficit present.     Mental Status: She is alert. Mental status is at baseline.    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 EGD on 8/20 negative for acute upper GI bleeding.Non-tunneled HD catheter placed with IR and first HD sessions on 8/21. She is s/p 3U pRBC since admission with appropriate response in Hgb.   Acute blood loss anemia on chronic anemia: Known  history of colonic AVMs with baseline Hb 9-11, Hb 7.3>>7.0 on admit.EGD negative for source. Colonoscopy deferred unless overt bleeding.Iron studies 8/19: Fe 19, Ferritin 14,TIBC 307, Saturation ratio 6%, consistent with IDA. S/P IV Fe (8/19) and ESA (8/20), 3u pRBCtotal. Hb stable at 8.5.  - Aranesp per Nephrology - Continue to monitor with CBC - Continue Protonix 40mg  qd  CKDVProgression to ESRD: Prior baselineCr3.3-3.4 BUN/Cr 132/6.1 on admission. No improvement w/ Foley or fluid challenge.FENa 1.8% suggests intrinsic etiology.Temporary HD cath placed 8/21. Plan for Tunneled HD cath and fistula creation this admission. Started HD on 8/21 has tolerated well thus far.  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  Afebrile since, WBC now WNL since 8/24. U/A w/ bacteruria but no WBC; CXR w/o PNA; Urine and blood cultures negative. No antibiotics given. - Continue to monitor  Urinary retention:  Urinary retention after Foley removal (up to 369mL PVR). Good PVR 8/24. - Continue to monitor bladder scans.  Mechanical falls w/o syncope: Two episodes of mechanical falls (tripped on bathroom rug) prior to  admission. No trauma nor LOC. No syncopal or presyncopal symptoms preceding fall; did report brief lightheadedness immediately upon standing. ng to walk. Reported landing on knee and back. Imaging negative. Some bruising on left hip noted.PT/OT recommend HH PT and rolling walker.  IDDM: Home Regimen: Lantus 21U, Novolog 3U TID. Last HbA1c 6.6 (03/2019). 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 F/U U/S in 3 years.  Diet:Renal diet Fluids:None Code: Full code DVT Prophylaxis: SCDs  Dispo: Anticipated discharge in approximately 4-10 day(s) pending HD access and community placement.   Neva Seat, MD  08/23/2019, 2:11 PM

## 2019-08-23 NOTE — Progress Notes (Signed)
Renal Navigator completed referral for OP HD treatment and faxed records to [facility] Admissions Coordinator. Renal Navigator will follow closely regarding acceptance and seat schedule. Per patient, she utilizes [employer] for appointments. Renal Navigator informed her that she can use this service for OP HD appointments as well and that Renal Navigator will be in touch with her once a schedule is secured. She is appreciative.  Alphonzo Cruise, [place] Renal Navigator [phone]

## 2019-08-23 NOTE — Progress Notes (Signed)
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  She was found to have acute on chronic kidney disease with an increase in creatinine from baseline of 3.57  05/2019 to 6.11 milligrams per deciliter on admission.  BUN increased to 132.  She is also noted to be acutely anemic and underwent EGD that showed esophagitis with strictures.  She follows with Dr. Joelyn Oms as an outpatient at [facility].  A decision was made that she had progressed to end-stage renal disease and dialysis was initiated with a non-tunneled dialysis catheter 08/19/2019.  She had a second dialysis treatment 08/21/2019 and the plan is for following dialysis treatment 08/23/2019.  Vein mapping has been ordered.  Recurrent fevers have resolved.  Appreciate assistance from Dr. Oneida Alar.  Blood pressure 161/74 pulse 82 temperature 99.3 O2 sats 95% room air  Sodium 139 potassium 4.3 chloride 105 CO2 23 BUN 71 creatinine 4.4 glucose 121.  Calcium 8.6 phosphorus 3.7 albumin 2.5.  WBC 8.7 hemoglobin 8.5 platelets 287  Allopurinol 100 mg daily, amlodipine 5 mg daily Lipitor 40 mg daily, Wellbutrin 150 mg twice daily, darbepoetin 100 mcg last administered 08/25/2019, gabapentin 300 mg daily, Linzess 72 mcg daily, metoprolol 12.5 mg twice daily, Protonix 40 mg daily.   Last chest x-ray 08/20/2019 showed cardiomegaly with mild interstitial edema.  CT scan of abdomen and pelvis showed no acute findings.  Aortic atherosclerosis with mild aneurysmal dilatation of the infrarenal aortic aneurysm measuring 3.1 cm small hiatal hernia and degenerative scoliosis.  Renal ultrasound showed no evidence of hydronephrosis  Objective:  Vital signs in last 24 hours:  Temp:  [98.3 F (36.8 C)-99.3 F (37.4  C)] 99.3 F (37.4 C) (08/25 0720) Pulse Rate:  [72-82] 82 (08/25 0900) Resp:  [15-27] 27 (08/25 0800) BP: (117-164)/(60-101) 161/74 (08/25 0900) SpO2:  [94 %-98 %] 95 % (08/25 0720) Weight:  [126.7 kg-127.1 kg] 127.1 kg (08/25 0720)  Weight change: -1.01 kg Filed Weights   08/22/19 0503 08/23/19 0433 08/23/19 0720  Weight: 126 kg 126.7 kg 127.1 kg    Intake/Output: I/O last 3 completed shifts: In: 2079 [P.O.:1764; Blood:315] Out: 2400 [Urine:1400; Other:1000]   Intake/Output this shift:  No intake/output data recorded.  General adult female in bed in no acute distress HEENT NCAT/ EOMI Neck supple trachea midline Lungs clear to auscultation bilaterally normal work of breathing at rest ; room air  Heart RRR no rub Abdomen soft nontender nondistended Extremities trace edema  Psych normal mood and affect Neuro - alert and oriented x 3 conversant  GU - has foley  Access: RIJ nontunneled dialysis catheter    Basic Metabolic Panel: Recent Labs  Lab 08/19/19 0339 08/20/19 0228 08/21/19 0340 08/22/19 0443 08/23/19 0254  NA 138 141 141 141 139  K 4.8 4.2 4.0 3.9 4.3  CL 109 107 109 106 105  CO2 16* 20* 22 24 23   GLUCOSE 128* 164* 88 115* 124*  BUN 123* 86* 91* 56* 71*  CREATININE 6.25* 5.17* 5.21* 3.66* 4.40*  CALCIUM 8.5* 8.5* 8.7* 8.5* 8.6*  PHOS  --  4.0 4.2 2.7 3.7    Liver Function Tests: Recent Labs  Lab 08/16/19 0934 08/17/19 0451 08/18/19 0932 08/19/19 0339 08/20/19 0228 08/21/19 0340 08/22/19 0443 08/23/19 0254  AST 12* 12* 12* 12*  --   --   --   --  ALT 11 10 9 11   --   --   --   --   ALKPHOS 109 115 103 107  --   --   --   --   BILITOT 0.4 0.4 0.7 0.3  --   --   --   --   PROT 6.8 6.6 5.7* 6.5  --   --   --   --   ALBUMIN 3.2* 3.1* 2.8* 3.1* 2.7* 2.6* 2.6* 2.5*   No results for input(s): LIPASE, AMYLASE in the last 168 hours. No results for input(s): AMMONIA in the last 168 hours.  CBC: Recent Labs  Lab 08/16/19 0934  08/19/19 0339  08/20/19 0228 08/21/19 0340 08/22/19 0443 08/23/19 0340  WBC 10.9*   < > 13.3* 10.8* 10.6* 9.1 8.7  NEUTROABS 8.7*  --   --   --  7.8*  --   --   HGB 7.3*   < > 8.6* 7.5* 7.6* 8.7* 8.5*  HCT 25.8*   < > 28.1* 25.4* 25.6* 29.1* 28.3*  MCV 81.1   < > 80.7 81.2 82.1 83.6 83.5  PLT 458*   < > 420* 354 315 293 287   < > = values in this interval not displayed.    Cardiac Enzymes: No results for input(s): CKTOTAL, CKMB, CKMBINDEX, TROPONINI in the last 168 hours.  BNP: Invalid input(s): POCBNP  CBG: Recent Labs  Lab 08/22/19 0619 08/22/19 1145 08/22/19 1605 08/22/19 2107 08/23/19 0625  GLUCAP 117* 198* 184* 212* 113*    Microbiology: Results for orders placed or performed during the hospital encounter of 08/16/19  SARS Coronavirus 2 [facility] order, Performed in [facility] hospital lab) Nasopharyngeal Nasopharyngeal Swab     Status: None   Collection Time: 08/16/19  2:34 PM   Specimen: Nasopharyngeal Swab  Result Value Ref Range Status   SARS Coronavirus 2 NEGATIVE NEGATIVE Final    Comment: (NOTE) If result is NEGATIVE SARS-CoV-2 target nucleic acids are NOT DETECTED. The SARS-CoV-2 RNA is generally detectable in upper and lower  respiratory specimens during the acute phase of infection. The lowest  concentration of SARS-CoV-2 viral copies this assay can detect is 250  copies / mL. A negative result does not preclude SARS-CoV-2 infection  and should not be used as the sole basis for treatment or other  patient management decisions.  A negative result may occur with  improper specimen collection / handling, submission of specimen other  than nasopharyngeal swab, presence of viral mutation(s) within the  areas targeted by this assay, and inadequate number of viral copies  (<250 copies / mL). A negative result must be combined with clinical  observations, patient history, and epidemiological information. If result is POSITIVE SARS-CoV-2 target nucleic acids are  DETECTED. The SARS-CoV-2 RNA is generally detectable in upper and lower  respiratory specimens dur ing the acute phase of infection.  Positive  results are indicative of active infection with SARS-CoV-2.  Clinical  correlation with patient history and other diagnostic information is  necessary to determine patient infection status.  Positive results do  not rule out bacterial infection or co-infection with other viruses. If result is PRESUMPTIVE POSTIVE SARS-CoV-2 nucleic acids MAY BE PRESENT.   A presumptive positive result was obtained on the submitted specimen  and confirmed on repeat testing.  While 2019 novel coronavirus  (SARS-CoV-2) nucleic acids may be present in the submitted sample  additional confirmatory testing may be necessary for epidemiological  and / or clinical management purposes  to differentiate between  SARS-CoV-2 and other Sarbecovirus currently known to infect humans.  If clinically indicated additional testing with an alternate test  methodology [phone]) is advised. The SARS-CoV-2 RNA is generally  detectable in upper and lower respiratory sp ecimens during the acute  phase of infection. The expected result is Negative. Fact Sheet for Patients:  StrictlyIdeas.no Fact Sheet for Healthcare Providers: BankingDealers.co.za This test is not yet approved or cleared by the [place] FDA and has been authorized for detection and/or diagnosis of SARS-CoV-2 by FDA under an Emergency Use Authorization (EUA).  This EUA will remain in effect (meaning this test can be used) for the duration of the COVID-19 declaration under Section 564(b)(1) of the Act, 21 U.S.C. section 360bbb-3(b)(1), unless the authorization is terminated or revoked sooner. Performed at [facility], [place] [address], [place], [place] [address]   Culture, Urine     Status: None   Collection Time: 08/19/19  7:12 AM   Specimen: Urine, Random   Result Value Ref Range Status   Specimen Description URINE, RANDOM  Final   Special Requests NONE  Final   Culture   Final    NO GROWTH Performed at [facility], [place] [address], [place], [place] [address]    Report Status 08/20/2019 FINAL  Final  Culture, blood (routine x 2)     Status: None (Preliminary result)   Collection Time: 08/19/19  7:14 AM   Specimen: BLOOD LEFT HAND  Result Value Ref Range Status   Specimen Description BLOOD LEFT HAND  Final   Special Requests   Final    BOTTLES DRAWN AEROBIC ONLY Blood Culture adequate volume   Culture   Final    NO GROWTH 3 DAYS Performed at [facility], [place] [address] [place], [place] [address]    Report Status PENDING  Incomplete  Culture, blood (routine x 2)     Status: None (Preliminary result)   Collection Time: 08/19/19  7:14 AM   Specimen: BLOOD LEFT HAND  Result Value Ref Range Status   Specimen Description BLOOD LEFT HAND  Final   Special Requests   Final    BOTTLES DRAWN AEROBIC ONLY Blood Culture adequate volume   Culture   Final    NO GROWTH 3 DAYS Performed at [facility], [place] [address], [place], [place] [address]    Report Status PENDING  Incomplete    Coagulation Studies: No results for input(s): LABPROT, INR in the last 72 hours.  Urinalysis: No results for input(s): COLORURINE, LABSPEC, PHURINE, GLUCOSEU, HGBUR, BILIRUBINUR, KETONESUR, PROTEINUR, UROBILINOGEN, NITRITE, LEUKOCYTESUR in the last 72 hours.  Invalid input(s): APPERANCEUR    Imaging: Vas [place] Upper Ext Vein Mapping (pre-op Avf)  Result Date: 08/22/2019 UPPER EXTREMITY VEIN MAPPING  Indications: Pre-access. Comparison Study: no prior Performing Technologist: June Leap RDMS, RVT  Examination Guidelines: A complete evaluation includes B-mode imaging, spectral Doppler, color Doppler, and power Doppler as needed of all accessible portions of each vessel. Bilateral testing is considered an integral part of a complete  examination. Limited examinations for reoccurring indications may be performed as noted. +-----------------+-------------+----------+--------------+ Right Cephalic   Diameter (cm)Depth (cm)   Findings    +-----------------+-------------+----------+--------------+ Shoulder             0.37        1.40                  +-----------------+-------------+----------+--------------+ Mid upper arm  0.32        0.23                  +-----------------+-------------+----------+--------------+ Dist upper arm       0.34        0.42      tortuous    +-----------------+-------------+----------+--------------+ Antecubital fossa    0.32                  thrombus    +-----------------+-------------+----------+--------------+ Prox forearm         0.08                              +-----------------+-------------+----------+--------------+ Mid forearm                             not visualized +-----------------+-------------+----------+--------------+ +-----------------+-------------+----------+---------+ Right Basilic    Diameter (cm)Depth (cm)Findings  +-----------------+-------------+----------+---------+ Mid upper arm        0.49        1.73             +-----------------+-------------+----------+---------+ Dist upper arm       0.45        0.86   branching +-----------------+-------------+----------+---------+ Antecubital fossa    0.37        1.25             +-----------------+-------------+----------+---------+ +-----------------+-------------+----------+---------+ Left Cephalic    Diameter (cm)Depth (cm)Findings  +-----------------+-------------+----------+---------+ Shoulder             0.35        0.90             +-----------------+-------------+----------+---------+ Mid upper arm        0.35        0.44             +-----------------+-------------+----------+---------+ Dist upper arm       0.32        0.35              +-----------------+-------------+----------+---------+ Antecubital fossa    0.54                         +-----------------+-------------+----------+---------+ Prox forearm         0.33        0.61   branching +-----------------+-------------+----------+---------+ Mid forearm          0.31        0.49             +-----------------+-------------+----------+---------+ *See table(s) above for measurements and observations.  Diagnosing physician:    Preliminary      Medications:   . sodium chloride    . sodium chloride     . allopurinol  100 mg Oral Daily  . amLODipine  5 mg Oral Daily  . atorvastatin  40 mg Oral q1800  . brinzolamide  1 drop Both Eyes BID  . buPROPion  150 mg Oral BID  . Chlorhexidine Gluconate Cloth  6 each Topical Q0600  . [START ON 08/25/2019] darbepoetin (ARANESP) injection - DIALYSIS  100 mcg Intravenous Q Thu-HD  . gabapentin  300 mg Oral Daily  . insulin aspart  0-15 Units Subcutaneous TID WC  . insulin aspart  0-5 Units Subcutaneous QHS  . insulin aspart  3 Units Subcutaneous TID WC  . latanoprost  1 drop Both Eyes QHS  . linaclotide  72 mcg Oral  QAC breakfast  . metoprolol tartrate  12.5 mg Oral BID  . pantoprazole  40 mg Oral Q0600   sodium chloride, sodium chloride, acetaminophen **OR** acetaminophen, alteplase, barrier cream, heparin, ipratropium-albuterol, lidocaine (PF), lidocaine (PF), lidocaine-prilocaine, mometasone-formoterol, pentafluoroprop-tetrafluoroeth, sodium chloride flush  Assessment/ Plan:   New end-stage renal disease with progression from stage IV/V chronic kidney disease.  Initiated hemodialysis through non-tunneled dialysis catheter placed by interventional radiology 08/19/2019.  She is scheduled for her third dialysis treatment Tuesday, 08/23/2019.  Vein mapping has been ordered.  Appreciate assistance from Dr. Oneida Alar.  Clip process in place  Anemia status post GI blood loss continues on Feraheme and darbepoetin.  She also  received packed red blood cells total of 3 units 08/17/2019 08/18/2019 and 08/21/2019.  Urinary retention continues to bladder scan  Fever work-up per primary team blood cultures NGTD urine culture NGTD  Hypertension controlled  Metabolic acidosis transition to hemodialysis  Diabetes mellitus per primary team  History of falls at home no fractures.  Urine drug screen positive for cocaine  Aortic atherosclerosis with a 3.1 cm aneurysm follow-up ultrasound recommended for 3 years.   LOS: [place] @TODAY @9 :29 AM

## 2019-08-23 NOTE — Progress Notes (Addendum)
  Date: 08/23/2019  Patient name: [name] [name]  Medical record number: [id number]  Date of birth: 31-Oct-1951   I have seen and evaluated this patient and I have discussed the plan of care with the house staff. Please see their note for complete details. I concur with their findings with the following additions/corrections:   Doing well on dialysis. No further fevers. Hgb stable with no further transfusion, hopefully bleeding has stopped.   Plan for AV fistula and tunneled catheter thursday.  Lenice Pressman, M.D., Ph.D. 08/23/2019, 2:24 PM

## 2019-08-23 NOTE — Progress Notes (Signed)
Spoke with pt confirmed left arm AV fistula and tunneled catheter on Thursday by my partner Dr Butch Penny, MD [facility] Office: [phone] Pager: [phone]

## 2019-08-24 LAB — GLUCOSE, CAPILLARY
Glucose-Capillary: 117 mg/dL — ABNORMAL HIGH (ref 70–99)
Glucose-Capillary: 187 mg/dL — ABNORMAL HIGH (ref 70–99)
Glucose-Capillary: 210 mg/dL — ABNORMAL HIGH (ref 70–99)
Glucose-Capillary: 140 mg/dL — ABNORMAL HIGH (ref 70–99)

## 2019-08-24 LAB — RENAL FUNCTION PANEL
Albumin: 2.5 g/dL — ABNORMAL LOW (ref 3.5–5.0)
Anion gap: 10 (ref 5–15)
BUN: 54 mg/dL — ABNORMAL HIGH (ref 8–23)
CO2: 26 mmol/L (ref 22–32)
Calcium: 8.7 mg/dL — ABNORMAL LOW (ref 8.9–10.3)
Chloride: 105 mmol/L (ref 98–111)
Creatinine, Ser: 3.98 mg/dL — ABNORMAL HIGH (ref 0.44–1.00)
GFR calc Af Amer: 13 mL/min — ABNORMAL LOW (ref >60)
GFR calc non Af Amer: 11 mL/min — ABNORMAL LOW (ref >60)
Glucose, Bld: 125 mg/dL — ABNORMAL HIGH (ref 70–99)
Phosphorus: 3.8 mg/dL (ref 2.5–4.6)
Potassium: 4.1 mmol/L (ref 3.5–5.1)
Sodium: 141 mmol/L (ref 135–145)

## 2019-08-24 LAB — CULTURE, BLOOD (ROUTINE X 2)
Culture: NO GROWTH
Culture: NO GROWTH
Special Requests: ADEQUATE
Special Requests: ADEQUATE

## 2019-08-24 LAB — CBC
HCT: 28.9 % — ABNORMAL LOW (ref 36.0–46.0)
Hemoglobin: 8.4 g/dL — ABNORMAL LOW (ref 12.0–15.0)
MCH: 24.8 pg — ABNORMAL LOW (ref 26.0–34.0)
MCHC: 29.1 g/dL — ABNORMAL LOW (ref 30.0–36.0)
MCV: 85.3 fL (ref 80.0–100.0)
Platelets: 266 10*3/uL (ref 150–400)
RBC: 3.39 MIL/uL — ABNORMAL LOW (ref 3.87–5.11)
RDW: 20 % — ABNORMAL HIGH (ref 11.5–15.5)
WBC: 8.5 10*3/uL (ref 4.0–10.5)
nRBC: 0.5 % — ABNORMAL HIGH (ref 0.0–0.2)

## 2019-08-24 LAB — SURGICAL PCR SCREEN
MRSA, PCR: NEGATIVE
Staphylococcus aureus: POSITIVE — AB

## 2019-08-24 MED ORDER — CHLORHEXIDINE GLUCONATE CLOTH 2 % EX PADS
6.0000 | MEDICATED_PAD | Freq: Every day | CUTANEOUS | Status: DC
  Administered 2019-08-24: 6 via TOPICAL

## 2019-08-24 MED ORDER — SODIUM CHLORIDE 0.9 % IV SOLN
1.5000 g | INTRAVENOUS | Status: AC
  Administered 2019-08-25: 1.5 g via INTRAVENOUS
  Filled 2019-08-24 (×2): qty 1.5

## 2019-08-24 MED ORDER — SODIUM CHLORIDE 0.9 % IV SOLN
1.5000 g | INTRAVENOUS | Status: DC
  Filled 2019-08-24: qty 1.5

## 2019-08-24 NOTE — Progress Notes (Signed)
Physical Therapy Treatment Patient Details Name: [name] [name] MRN: [id number] DOB: Jan 25, 1951 Today's Date: 08/24/2019    History of Present Illness Patient is a 68 year old female admitted with anemia.PMH to include DM, CAD s/p CABG, CKD, HF.    PT Comments    Pt agreeable to therapy, asks to transfer to [facility] prior to ambulation. Pt is mod I for bed mobility, min guard for transfers and ambulation of 60 feet with RW. Pt self limiting in progression of mobility. Pt needs to be able to ascend 13 steps into her home and agrees she needs to practice before going home but refuses stair training again today. PT will continue to progress mobility so pt will be able to get into her home at d/c.   Follow Up Recommendations  Home health PT     Equipment Recommendations  Rolling walker with 5" wheels    Recommendations for Other Services       Precautions / Restrictions Precautions Precautions: Fall Restrictions Weight Bearing Restrictions: No    Mobility  Bed Mobility Overal bed mobility: Modified Independent Bed Mobility: Supine to Sit           General bed mobility comments: increased time with use of rail  Transfers Overall transfer level: Needs assistance   Transfers: Sit to/from Stand;Stand Pivot Transfers Sit to Stand: Supervision Stand pivot transfers: Min guard       General transfer comment: able to rise from bed without assist, and pivot to [facility], increased effort and use of rail on BSC to power up able to self steady before reaching for RW  Ambulation/Gait Ambulation/Gait assistance: Min guard Gait Distance (Feet): 60 Feet Assistive device: Rolling walker (2 wheeled) Gait Pattern/deviations: Step-through pattern;Decreased stride length;Trunk flexed Gait velocity: slowed Gait velocity interpretation: <1.31 ft/sec, indicative of household ambulator General Gait Details: very slow, cautious gait, vc for proximity to RW and upright posture.       Balance  Overall balance assessment: Needs assistance Sitting-balance support: Feet supported;No upper extremity supported Sitting balance-Leahy Scale: Good Sitting balance - Comments: able to sit EOB without assist   Standing balance support: No upper extremity supported;During functional activity Standing balance-Leahy Scale: Fair Standing balance comment: able to self steady without assist, UE support required for dynamic motion                             Cognition Arousal/Alertness: Awake/alert Behavior During Therapy: WFL for tasks assessed/performed Overall Cognitive Status: Within Functional Limits for tasks assessed                                           General Comments General comments (skin integrity, edema, etc.): 3/4 DoE with ambulation SaO2 on RA 98%O2 Pt refuses stair training, educated on need to attempt given the 13 steps she has to ascend to apartment      Pertinent Vitals/Pain Pain Assessment: No/denies pain           PT Goals (current goals can now be found in the care plan section) Acute Rehab PT Goals PT Goal Formulation: With patient Time For Goal Achievement: 09/01/19 Potential to Achieve Goals: Good Progress towards PT goals: Progressing toward goals    Frequency           PT Plan Current plan remains appropriate  AM-PAC PT "6 Clicks" Mobility   Outcome Measure  Help needed turning from your back to your side while in a flat bed without using bedrails?: A Little Help needed moving from lying on your back to sitting on the side of a flat bed without using bedrails?: A Little Help needed moving to and from a bed to a chair (including a wheelchair)?: A Little Help needed standing up from a chair using your arms (e.g., wheelchair or bedside chair)?: None Help needed to walk in hospital room?: A Little Help needed climbing 3-5 steps with a railing? : A Lot 6 Click Score: 18    End of Session Equipment Utilized  During Treatment: Gait belt Activity Tolerance: Patient tolerated treatment well Patient left: with call bell/phone within reach;with chair alarm set(sitting EOB, tray table in front of her) Nurse Communication: Mobility status PT Visit Diagnosis: Muscle weakness (generalized) (M62.81);Difficulty in walking, not elsewhere classified (R26.2);History of falling (Z91.81)     Time: 2426-8341 PT Time Calculation (min) (ACUTE ONLY): 12 min  Charges:  $Gait Training: 8-22 mins                     [name] B. [name] PT, DPT Acute Rehabilitation Services Pager [phone] Office [phone]    [name] 08/24/2019, 4:18 PM

## 2019-08-24 NOTE — Progress Notes (Signed)
Arrived for evening line check to find patient's Bailey with no dressing, dressing reapplied per policy.  Spoke with [place], RN who states that dressing was noted intact with shift change report.  Loreli Slot will keep check to make sure patient is not removing dressing.  Carolee Rota, RN VAST

## 2019-08-24 NOTE — Progress Notes (Signed)
  Date: 08/24/2019  Patient name: [name] [name]  Medical record number: [id number]  Date of birth: [birth year]   I have seen and evaluated this patient and I have discussed the plan of care with the house staff. Please see their note for complete details. I concur with their findings with the following additions/corrections:   Continues to do well with dialysis, no further fevers, Hgb holding steady. Plan for fistula and tunneled catheter tomorrow with vascular, appreciate their care.  Lenice Pressman, M.D., Ph.D. 08/24/2019, 3:02 PM

## 2019-08-24 NOTE — TOC Progression Note (Addendum)
Transition of Care [facility]) - Progression Note    Patient Details  Name: CAYLAN CHENARD MRN: [id number] Date of Birth: 30-Jun-1951  Transition of Care [facility]) CM/SW Contact  Zenon Mayo, RN Phone Number: 08/24/2019, 9:58 AM  Clinical Narrative:    Spoke w patient at bedside. She is from home with spouse, has RW, get frequent transfusions. We discussed [facility] services and providers and medicare ratings. She would like to use [facility]. Referral placed to Butch Penny for RN PT OT, per previous NCM note. Colleen the Renal Navigator is working on clipping patient at [employer].   Expected Discharge Plan: [place] Barriers to Discharge: Continued Medical Work up  Expected Discharge Plan and Services Expected Discharge Plan: [place] Choice: [place]: RN, PT, OT [facility] Agency: [place] ([place]) Date [facility] Agency Contacted: 08/18/19 Time [place]: 1211 Representative spoke with at [place]: [place] ([place]) Interventions    Readmission Risk Interventions Readmission Risk Prevention Plan 06/03/2019 06/02/2019 04/29/2019  Transportation Screening - Complete Complete  Medication Review [employer]) - Complete Complete  PCP or Specialist appointment within 3-5 days of discharge Complete - Complete  HRI or [place] - - (No Data)  SW Recovery Care/Counseling Consult - Complete Complete  Palliative Care Screening - Not Applicable Not Applicable  Skilled Nursing Facility - Not Applicable Not Applicable  Some recent data might be hidden

## 2019-08-24 NOTE — Progress Notes (Signed)
Subjective: Patient examined at bedside this morning. She is in no acute distress. She was able to walk to door and back with OT yesterday without dyspnea. Plan is for left arm AV fistula and tunneled catheter tomorrow with VVS.  Patient would like her sister to be updated.   Objective:  Vital signs in last 24 hours: Vitals:   08/23/19 1048 08/23/19 1144 08/23/19 1954 08/24/19 0526  BP: (!) 137/94 (!) 147/71 (!) 132/59 (!) 144/66  Pulse: 66 87 74 73  Resp: 16 16 17 16   Temp: 99.1 F (37.3 C) 99.6 F (37.6 C) 98.7 F (37.1 C) 97.9 F (36.6 C)  TempSrc: Oral Oral Oral Oral  SpO2: 99% 93% 99% 92%  Weight: 124 kg   124.2 kg  Height:       Physical Exam Constitutional:      General: She is not in acute distress.    Appearance: Normal appearance. She is obese.  Cardiovascular:     Rate and Rhythm: Normal rate and regular rhythm.     Pulses: Normal pulses.     Heart sounds: Normal heart sounds. No murmur. No friction rub. No gallop.   Pulmonary:     Effort: Pulmonary effort is normal. No respiratory distress.     Breath sounds: Normal breath sounds. No wheezing, rhonchi or rales.  Abdominal:     General: Bowel sounds are normal. There is no distension.     Palpations: Abdomen is soft.     Tenderness: There is no abdominal tenderness. There is no guarding.  Skin:    General: Skin is warm and dry.  Neurological:     General: No focal deficit present.     Mental Status: She is alert and oriented to person, place, and time. Mental status is at baseline.     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 EGD on 8/20 negative for acute upper GI bleeding.Non-tunneled HD catheter placed with IR and first HD sessionson 8/21. She is s/p 3U pRBC since admission with appropriate response in Hgb. Plan for tunneled HD catheter and L arm AV fistula tomorrow with VVS.    Acute blood loss anemia on chronic anemia: Known history of colonic AVMs with baseline Hb 9-11, Hb 7.3>>7.0 on admit.EGD negative for source. Colonoscopy deferred unless overt bleeding.Iron studies 8/19: Fe 19, Ferritin 14,TIBC 307, Saturation ratio 6%, consistent with IDA. S/P IV Fe (8/19) and ESA (8/20), 3u pRBCtotal. Hb stable at 8.4 this AM  - Aranesp per Nephrology - Continue to monitor with CBC - Continue Protonix 40mg  qd  CKDVProgression to ESRD: Prior baselineCr3.3-3.4 BUN/Cr 132/6.1 on admission. No improvement w/ Foley or fluid challenge.FENa 1.8% suggests intrinsic etiology.Temporary HD cath placed 8/21. Started HD on 8/21 has tolerated well thus far. Plan for Tunneled HD cath and fistula creation tomorrow.   - Gabapentin 300mg  qd for neuropathy  - Continue strict I/O's, Renal diet  - Renal function panel  Feverof unknown etiology: Fever of 101-102 on 8/20 and 8/21 w/ mild leukokcytosis.  Afebrile since, WBC now WNL since 8/24. U/A w/ bacteruria but no WBC; CXR w/o PNA; Urine and blood cultures negative. No antibiotics given.  - Continue to monitor  Urinary retention:  Urinary retention after Foley removal (up to 321mL PVR). Good PVR 8/24.  - Continue to monitor bladder scans.  Mechanical falls w/o syncope: Two episodes of  mechanical falls (tripped on bathroom rug) prior to admission. No trauma nor LOC. No syncopal or presyncopal symptoms preceding fall; did report brief lightheadedness immediately upon standing. ng to walk. Reported landing on knee and back. Imaging negative. Some bruising on left hip noted.PT/OT recommend HH PT and rolling walker.  IDDM: Home Regimen: Lantus 21U, Novolog 3U TID. Last HbA1c 6.6 (03/2019). 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 F/U U/S in 3 years.  Diet:Renal diet Fluids:None Code: Full code DVT Prophylaxis: SCDs  Dispo: Anticipated discharge in approximately 4-7 day(s) pending HD access and community placement.    Harvie Heck, MD  Internal Medicine, PGY1 08/24/2019, 6:35 AM Pager: [phone]

## 2019-08-24 NOTE — TOC Progression Note (Signed)
Transition of Care [facility]) - Progression Note    Patient Details  Name: KANISHA DUBA MRN: [id number] Date of Birth: Sep 22, 1951  Transition of Care [facility]) CM/SW Contact  Zenon Mayo, RN Phone Number: 08/24/2019, 8:37 AM  Clinical Narrative:    [facility] team continues to follow for TOC needs.   Expected Discharge Plan: [place] Barriers to Discharge: Continued Medical Work up  Expected Discharge Plan and Services Expected Discharge Plan: [place] Choice: [place]: RN, PT, OT [facility] Agency: [place] ([facility]) Date [facility] Agency Contacted: 08/18/19 Time [place]: 1211 Representative spoke with at [place]: [place] ([place]) Interventions    Readmission Risk Interventions Readmission Risk Prevention Plan 06/03/2019 06/02/2019 04/29/2019  Transportation Screening - Complete Complete  Medication Review [employer]) - Complete Complete  PCP or Specialist appointment within 3-5 days of discharge Complete - Complete  HRI or [place] - - (No Data)  SW Recovery Care/Counseling Consult - Complete Complete  Palliative Care Screening - Not Applicable Not Applicable  Skilled Nursing Facility - Not Applicable Not Applicable  Some recent data might be hidden

## 2019-08-24 NOTE — Progress Notes (Signed)
Patient has been accepted for OP HD treatment at [facility] on a TTS schedule with a 7am seat time. She needs to arrive at 6:40am for her appointments. Renal Navigator has informed Nephrologist/Dr. Justin Mend and discussed potential discharge plan. If patient is discharged by Friday, 08/26/19, she will start in the clinic on Saturday, 08/27/19. She will need to go to the clinic on Friday afternoon between the hours of 2:30pm-5:00pm to complete intake paperwork in order to start on Saturday. Renal Navigator spoke with patient to provide this information. Navigator will provide this information in writing prior to discharge also.   [facility] [address] [place]

## 2019-08-24 NOTE — Progress Notes (Signed)
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  She was found to have acute on chronic kidney disease with an increase in creatinine from baseline of 3.57  05/2019 to 6.11 milligrams per deciliter on admission.  BUN increased to 132.  She is also noted to be acutely anemic and underwent EGD that showed esophagitis with strictures.  She follows with Dr. Joelyn Oms as an outpatient at [facility].  A decision was made that she had progressed to end-stage renal disease and dialysis was initiated with a non-tunneled dialysis catheter 08/19/2019.  She had a second dialysis treatment 08/21/2019 and the plan is for following dialysis treatment 08/23/2019.  Vein mapping has been ordered.  Recurrent fevers have resolved.  Appreciate assistance from Dr. Oneida Alar.  Left arm AV fistula and tunneled dialysis catheter placed on Thursday by Dr. Donzetta Matters.  Renal navigator to schedule outpatient dialysis  Blood pressure 131/79 pulse 77 temperature 97.9 O2 sats 95% room air  Sodium 141 potassium 4.1 chloride 105 CO2 26 BUN 54 creatinine 3.98 glucose 125 calcium 8.7 phosphorus 3.8 albumin 2.5 WBC 8.5 hemoglobin 8.4 platelets 266  Allopurinol 100 mg daily, amlodipine 5 mg daily Lipitor 40 mg daily, Wellbutrin 150 mg twice daily, darbepoetin 100 mcg last administered 08/25/2019, gabapentin 300 mg daily, Linzess 72 mcg daily, metoprolol 12.5 mg twice daily, Protonix 40 mg daily.   Last chest x-ray 08/20/2019 showed cardiomegaly with mild interstitial edema.  CT scan of abdomen and pelvis showed no acute findings.  Aortic atherosclerosis with mild aneurysmal dilatation of the infrarenal aortic aneurysm measuring 3.1 cm small hiatal hernia and degenerative scoliosis.  Renal  ultrasound showed no evidence of hydronephrosis  Objective:  Vital signs in last 24 hours:  Temp:  [97.9 F (36.6 C)-99.6 F (37.6 C)] 97.9 F (36.6 C) (08/26 0526) Pulse Rate:  [66-87] 78 (08/26 0858) Resp:  [16-17] 16 (08/26 0526) BP: (131-156)/(59-94) 131/79 (08/26 0858) SpO2:  [92 %-99 %] 95 % (08/26 0858) Weight:  [124 kg-124.2 kg] 124.2 kg (08/26 0526)  Weight change: 0.41 kg Filed Weights   08/23/19 0720 08/23/19 1048 08/24/19 0526  Weight: 127.1 kg 124 kg 124.2 kg    Intake/Output: I/O last 3 completed shifts: In: 1380 [P.O.:1380] Out: 8416 [Urine:650; SAYTK:1601]   Intake/Output this shift:  Total I/O In: 240 [P.O.:240] Out: 0   General adult female in bed in no acute distress HEENT NCAT/ EOMI Neck supple trachea midline Lungs clear to auscultation bilaterally normal work of breathing at rest ; room air  Heart RRR no rub Abdomen soft nontender nondistended Extremities trace edema  Psych normal mood and affect Neuro - alert and oriented x 3 conversant  GU - has foley  Access: RIJ nontunneled dialysis catheter    Basic Metabolic Panel: Recent Labs  Lab 08/20/19 0228 08/21/19 0340 08/22/19 0443 08/23/19 0254 08/24/19 0401  NA 141 141 141 139 141  K 4.2 4.0 3.9 4.3 4.1  CL 107 109 106 105 105  CO2 20* 22 24 23 26   GLUCOSE 164* 88 115* 124* 125*  BUN 86* 91* 56* 71* 54*  CREATININE 5.17* 5.21* 3.66* 4.40* 3.98*  CALCIUM 8.5* 8.7* 8.5* 8.6* 8.7*  PHOS 4.0 4.2 2.7 3.7 3.8    Liver Function Tests: Recent Labs  Lab 08/18/19 0932  08/19/19 3086 08/20/19 0228 08/21/19 0340 08/22/19 0443 08/23/19 0254 08/24/19 0401  AST 12* 12*  --   --   --   --   --   ALT 9 11  --   --   --   --   --   ALKPHOS 103 107  --   --   --   --   --   BILITOT 0.7 0.3  --   --   --   --   --   PROT 5.7* 6.5  --   --   --   --   --   ALBUMIN 2.8* 3.1* 2.7* 2.6* 2.6* 2.5* 2.5*   No results for input(s): LIPASE, AMYLASE in the last 168 hours. No results for input(s):  AMMONIA in the last 168 hours.  CBC: Recent Labs  Lab 08/20/19 0228 08/21/19 0340 08/22/19 0443 08/23/19 0340 08/24/19 0401  WBC 10.8* 10.6* 9.1 8.7 8.5  NEUTROABS  --  7.8*  --   --   --   HGB 7.5* 7.6* 8.7* 8.5* 8.4*  HCT 25.4* 25.6* 29.1* 28.3* 28.9*  MCV 81.2 82.1 83.6 83.5 85.3  PLT 354 315 293 287 266    Cardiac Enzymes: No results for input(s): CKTOTAL, CKMB, CKMBINDEX, TROPONINI in the last 168 hours.  BNP: Invalid input(s): POCBNP  CBG: Recent Labs  Lab 08/23/19 0625 08/23/19 1114 08/23/19 1619 08/23/19 2120 08/24/19 0650  GLUCAP 113* 117* 225* 129* 117*    Microbiology: Results for orders placed or performed during the hospital encounter of 08/16/19  SARS Coronavirus 2 [facility] order, Performed in [facility] lab) Nasopharyngeal Nasopharyngeal Swab     Status: None   Collection Time: 08/16/19  2:34 PM   Specimen: Nasopharyngeal Swab  Result Value Ref Range Status   SARS Coronavirus 2 NEGATIVE NEGATIVE Final    Comment: (NOTE) If result is NEGATIVE SARS-CoV-2 target nucleic acids are NOT DETECTED. The SARS-CoV-2 RNA is generally detectable in upper and lower  respiratory specimens during the acute phase of infection. The lowest  concentration of SARS-CoV-2 viral copies this assay can detect is 250  copies / mL. A negative result does not preclude SARS-CoV-2 infection  and should not be used as the sole basis for treatment or other  patient management decisions.  A negative result may occur with  improper specimen collection / handling, submission of specimen other  than nasopharyngeal swab, presence of viral mutation(s) within the  areas targeted by this assay, and inadequate number of viral copies  (<250 copies / mL). A negative result must be combined with clinical  observations, patient history, and epidemiological information. If result is POSITIVE SARS-CoV-2 target nucleic acids are DETECTED. The SARS-CoV-2 RNA is generally detectable  in upper and lower  respiratory specimens dur ing the acute phase of infection.  Positive  results are indicative of active infection with SARS-CoV-2.  Clinical  correlation with patient history and other diagnostic information is  necessary to determine patient infection status.  Positive results do  not rule out bacterial infection or co-infection with other viruses. If result is PRESUMPTIVE POSTIVE SARS-CoV-2 nucleic acids MAY BE PRESENT.   A presumptive positive result was obtained on the submitted specimen  and confirmed on repeat testing.  While 2019 novel coronavirus  (SARS-CoV-2) nucleic acids may be present in the submitted sample  additional confirmatory testing may be necessary for epidemiological  and / or clinical management purposes  to differentiate between  SARS-CoV-2 and other Sarbecovirus  currently known to infect humans.  If clinically indicated additional testing with an alternate test  methodology [phone]) is advised. The SARS-CoV-2 RNA is generally  detectable in upper and lower respiratory sp ecimens during the acute  phase of infection. The expected result is Negative. Fact Sheet for Patients:  StrictlyIdeas.no Fact Sheet for Healthcare Providers: BankingDealers.co.za This test is not yet approved or cleared by the [place] FDA and has been authorized for detection and/or diagnosis of SARS-CoV-2 by FDA under an Emergency Use Authorization (EUA).  This EUA will remain in effect (meaning this test can be used) for the duration of the COVID-19 declaration under Section 564(b)(1) of the Act, 21 U.S.C. section 360bbb-3(b)(1), unless the authorization is terminated or revoked sooner. Performed at [facility], [place] [address], [place], [place] [address]   Culture, Urine     Status: None   Collection Time: 08/19/19  7:12 AM   Specimen: Urine, Random  Result Value Ref Range Status   Specimen Description  URINE, RANDOM  Final   Special Requests NONE  Final   Culture   Final    NO GROWTH Performed at [facility], [place] [address], [place], [place] [address]    Report Status 08/20/2019 FINAL  Final  Culture, blood (routine x 2)     Status: None (Preliminary result)   Collection Time: 08/19/19  7:14 AM   Specimen: BLOOD LEFT HAND  Result Value Ref Range Status   Specimen Description BLOOD LEFT HAND  Final   Special Requests   Final    BOTTLES DRAWN AEROBIC ONLY Blood Culture adequate volume   Culture   Final    NO GROWTH 4 DAYS Performed at [facility], [place] [address] [place], [place] [address]    Report Status PENDING  Incomplete  Culture, blood (routine x 2)     Status: None (Preliminary result)   Collection Time: 08/19/19  7:14 AM   Specimen: BLOOD LEFT HAND  Result Value Ref Range Status   Specimen Description BLOOD LEFT HAND  Final   Special Requests   Final    BOTTLES DRAWN AEROBIC ONLY Blood Culture adequate volume   Culture   Final    NO GROWTH 4 DAYS Performed at [facility], [place] [address], [place], [place] [address]    Report Status PENDING  Incomplete    Coagulation Studies: No results for input(s): LABPROT, INR in the last 72 hours.  Urinalysis: No results for input(s): COLORURINE, LABSPEC, PHURINE, GLUCOSEU, HGBUR, BILIRUBINUR, KETONESUR, PROTEINUR, UROBILINOGEN, NITRITE, LEUKOCYTESUR in the last 72 hours.  Invalid input(s): APPERANCEUR    Imaging: No results found.   Medications:   . [START ON 08/25/2019] cefUROXime (ZINACEF)  IV     . allopurinol  100 mg Oral Daily  . amLODipine  5 mg Oral Daily  . atorvastatin  40 mg Oral q1800  . brinzolamide  1 drop Both Eyes BID  . buPROPion  150 mg Oral BID  . Chlorhexidine Gluconate Cloth  6 each Topical Q0600  . [START ON 08/25/2019] darbepoetin (ARANESP) injection - DIALYSIS  100 mcg Intravenous Q Thu-HD  . gabapentin  300 mg Oral Daily  . insulin aspart  0-15 Units Subcutaneous TID WC   . insulin aspart  0-5 Units Subcutaneous QHS  . insulin aspart  3 Units Subcutaneous TID WC  . latanoprost  1 drop Both Eyes QHS  . linaclotide  72 mcg Oral QAC breakfast  . metoprolol tartrate  12.5 mg Oral BID  . pantoprazole  40 mg Oral Q0600   acetaminophen **OR** acetaminophen, barrier cream, ipratropium-albuterol, lidocaine (PF), mometasone-formoterol, sodium chloride flush  Assessment/ Plan:   New end-stage renal disease with progression from stage IV/V chronic kidney disease.  Initiated hemodialysis through non-tunneled dialysis catheter placed by interventional radiology 08/19/2019.  She underwent successful dialysis 08/23/2019 with removal of 2.9 l vein mapping has been ordered.  Appreciate assistance from Dr. Oneida Alar.  Clip process in place.  Placement of fistula and permanent dialysis catheter 08/25/2019.  Her next dialysis session will be scheduled for 08/25/2019  Anemia status post GI blood loss continues on Feraheme and darbepoetin.  She also received packed red blood cells total of 3 units 08/17/2019 08/18/2019 and 08/21/2019.  Urinary retention continues to bladder scan  Fever work-up per primary team blood cultures NGTD urine culture NGTD  Hypertension controlled  Metabolic acidosis transition to hemodialysis  Diabetes mellitus per primary team  History of falls at home no fractures.  Urine drug screen positive for cocaine  Aortic atherosclerosis with a 3.1 cm aneurysm follow-up ultrasound recommended for 3 years.   LOS: [place] @TODAY @9 :37 AM

## 2019-08-25 ENCOUNTER — Inpatient Hospital Stay (HOSPITAL_COMMUNITY): Payer: Medicare Other | Admitting: Anesthesiology

## 2019-08-25 ENCOUNTER — Encounter (HOSPITAL_COMMUNITY)
Admission: EM | Disposition: A | Discharge status: Home Health | Payer: Self-pay | Source: Home / Self Care | Attending: Internal Medicine

## 2019-08-25 ENCOUNTER — Encounter (HOSPITAL_COMMUNITY): Payer: Self-pay | Admitting: Anesthesiology

## 2019-08-25 ENCOUNTER — Inpatient Hospital Stay (HOSPITAL_COMMUNITY): Payer: Medicare Other

## 2019-08-25 DIAGNOSIS — N185 Chronic kidney disease, stage 5: Secondary | ICD-10-CM

## 2019-08-25 HISTORY — PX: INSERTION OF DIALYSIS CATHETER: SHX1324

## 2019-08-25 HISTORY — PX: AV FISTULA PLACEMENT: SHX1204

## 2019-08-25 LAB — BASIC METABOLIC PANEL
Anion gap: 12 (ref 5–15)
BUN: 62 mg/dL — ABNORMAL HIGH (ref 8–23)
CO2: 24 mmol/L (ref 22–32)
Calcium: 9 mg/dL (ref 8.9–10.3)
Chloride: 105 mmol/L (ref 98–111)
Creatinine, Ser: 4.58 mg/dL — ABNORMAL HIGH (ref 0.44–1.00)
GFR calc Af Amer: 11 mL/min — ABNORMAL LOW (ref >60)
GFR calc non Af Amer: 9 mL/min — ABNORMAL LOW (ref >60)
Glucose, Bld: 108 mg/dL — ABNORMAL HIGH (ref 70–99)
Potassium: 4.4 mmol/L (ref 3.5–5.1)
Sodium: 141 mmol/L (ref 135–145)

## 2019-08-25 LAB — CBC
HCT: 41.1 % (ref 36.0–46.0)
Hemoglobin: 12 g/dL (ref 12.0–15.0)
MCH: 24.7 pg — ABNORMAL LOW (ref 26.0–34.0)
MCHC: 29.2 g/dL — ABNORMAL LOW (ref 30.0–36.0)
MCV: 84.6 fL (ref 80.0–100.0)
Platelets: 231 10*3/uL (ref 150–400)
RBC: 4.86 MIL/uL (ref 3.87–5.11)
RDW: 20.8 % — ABNORMAL HIGH (ref 11.5–15.5)
WBC: 5.5 10*3/uL (ref 4.0–10.5)
nRBC: 0.4 % — ABNORMAL HIGH (ref 0.0–0.2)

## 2019-08-25 LAB — GLUCOSE, CAPILLARY
Glucose-Capillary: 133 mg/dL — ABNORMAL HIGH (ref 70–99)
Glucose-Capillary: 102 mg/dL — ABNORMAL HIGH (ref 70–99)
Glucose-Capillary: 132 mg/dL — ABNORMAL HIGH (ref 70–99)
Glucose-Capillary: 139 mg/dL — ABNORMAL HIGH (ref 70–99)
Glucose-Capillary: 182 mg/dL — ABNORMAL HIGH (ref 70–99)

## 2019-08-25 LAB — SARS CORONAVIRUS 2 (TAT 6-24 HRS): SARS Coronavirus 2: NEGATIVE

## 2019-08-25 LAB — PROTIME-INR
INR: 1.2 (ref 0.8–1.2)
Prothrombin Time: 15.2 seconds (ref 11.4–15.2)

## 2019-08-25 SURGERY — ARTERIOVENOUS (AV) FISTULA CREATION
Anesthesia: Monitor Anesthesia Care | Site: Neck | Laterality: Right

## 2019-08-25 MED ORDER — MIDAZOLAM HCL 2 MG/2ML IJ SOLN: 0.5000 mg | Freq: Once | INTRAMUSCULAR | Status: DC | PRN

## 2019-08-25 MED ORDER — HEPARIN SODIUM (PORCINE) 1000 UNIT/ML IJ SOLN
INTRAMUSCULAR | Status: AC
  Administered 2019-08-25: 1000 [IU]
  Filled 2019-08-25: qty 3

## 2019-08-25 MED ORDER — FENTANYL CITRATE (PF) 250 MCG/5ML IJ SOLN
INTRAMUSCULAR | Status: AC
  Filled 2019-08-25: qty 5

## 2019-08-25 MED ORDER — 0.9 % SODIUM CHLORIDE (POUR BTL) OPTIME
TOPICAL | Status: DC | PRN
  Administered 2019-08-25: 13:00:00 1000 mL

## 2019-08-25 MED ORDER — PHENYLEPHRINE 40 MCG/ML (10ML) SYRINGE FOR IV PUSH (FOR BLOOD PRESSURE SUPPORT)
PREFILLED_SYRINGE | INTRAVENOUS | Status: DC | PRN
  Administered 2019-08-25: 120 ug via INTRAVENOUS
  Administered 2019-08-25: 80 ug via INTRAVENOUS

## 2019-08-25 MED ORDER — PROPOFOL 1000 MG/100ML IV EMUL
INTRAVENOUS | Status: AC
  Filled 2019-08-25: qty 100

## 2019-08-25 MED ORDER — FENTANYL CITRATE (PF) 100 MCG/2ML IJ SOLN: 25.0000 ug | INTRAMUSCULAR | Status: DC | PRN

## 2019-08-25 MED ORDER — SODIUM CHLORIDE 0.9 % IV SOLN
INTRAVENOUS | Status: DC | PRN
  Administered 2019-08-25: 50 ug/min via INTRAVENOUS

## 2019-08-25 MED ORDER — CHLORHEXIDINE GLUCONATE CLOTH 2 % EX PADS
6.0000 | MEDICATED_PAD | Freq: Every day | CUTANEOUS | Status: DC
  Administered 2019-08-25 – 2019-08-26 (×2): 6 via TOPICAL

## 2019-08-25 MED ORDER — HEPARIN SODIUM (PORCINE) 1000 UNIT/ML IJ SOLN
INTRAMUSCULAR | Status: DC | PRN
  Administered 2019-08-25: 3000 [IU]

## 2019-08-25 MED ORDER — HEPARIN SODIUM (PORCINE) 1000 UNIT/ML IJ SOLN
INTRAMUSCULAR | Status: AC
  Filled 2019-08-25: qty 1

## 2019-08-25 MED ORDER — ONDANSETRON HCL 4 MG/2ML IJ SOLN
INTRAMUSCULAR | Status: DC | PRN
  Administered 2019-08-25: 4 mg via INTRAVENOUS

## 2019-08-25 MED ORDER — MIDAZOLAM HCL 5 MG/5ML IJ SOLN
INTRAMUSCULAR | Status: DC | PRN
  Administered 2019-08-25: 2 mg via INTRAVENOUS

## 2019-08-25 MED ORDER — INSULIN ASPART 100 UNIT/ML ~~LOC~~ SOLN
3.0000 [IU] | Freq: Three times a day (TID) | SUBCUTANEOUS | Status: DC
  Administered 2019-08-26 (×2): 3 [IU] via SUBCUTANEOUS

## 2019-08-25 MED ORDER — MEPERIDINE HCL 25 MG/ML IJ SOLN: 6.2500 mg | INTRAMUSCULAR | Status: DC | PRN

## 2019-08-25 MED ORDER — ONDANSETRON HCL 4 MG/2ML IJ SOLN
INTRAMUSCULAR | Status: AC
  Filled 2019-08-25: qty 2

## 2019-08-25 MED ORDER — MIDAZOLAM HCL 2 MG/2ML IJ SOLN
INTRAMUSCULAR | Status: AC
  Filled 2019-08-25: qty 2

## 2019-08-25 MED ORDER — SODIUM CHLORIDE 0.9 % IV SOLN
INTRAVENOUS | Status: AC
  Filled 2019-08-25: qty 1.2

## 2019-08-25 MED ORDER — HEPARIN SODIUM (PORCINE) 1000 UNIT/ML IJ SOLN
INTRAMUSCULAR | Status: AC
  Filled 2019-08-25: qty 2

## 2019-08-25 MED ORDER — SODIUM CHLORIDE 0.9 % IV SOLN
INTRAVENOUS | Status: DC | PRN
  Administered 2019-08-25: 12:00:00 via INTRAVENOUS

## 2019-08-25 MED ORDER — HYDROCODONE-ACETAMINOPHEN 5-325 MG PO TABS: 1.0000 | ORAL_TABLET | ORAL | Status: DC | PRN

## 2019-08-25 MED ORDER — LIDOCAINE 2% (20 MG/ML) 5 ML SYRINGE
INTRAMUSCULAR | Status: DC | PRN
  Administered 2019-08-25: 40 mg via INTRAVENOUS

## 2019-08-25 MED ORDER — PROMETHAZINE HCL 25 MG/ML IJ SOLN: 6.2500 mg | INTRAMUSCULAR | Status: DC | PRN

## 2019-08-25 MED ORDER — PROPOFOL 500 MG/50ML IV EMUL
INTRAVENOUS | Status: DC | PRN
  Administered 2019-08-25: 50 ug/kg/min via INTRAVENOUS

## 2019-08-25 MED ORDER — SODIUM CHLORIDE 0.9 % IV SOLN
INTRAVENOUS | Status: DC | PRN
  Administered 2019-08-25: 500 mL

## 2019-08-25 MED ORDER — LIDOCAINE HCL (PF) 1 % IJ SOLN
INTRAMUSCULAR | Status: DC | PRN
  Administered 2019-08-25: 20 mL

## 2019-08-25 MED ORDER — LIDOCAINE-EPINEPHRINE (PF) 1 %-1:200000 IJ SOLN
INTRAMUSCULAR | Status: AC
  Filled 2019-08-25: qty 30

## 2019-08-25 MED ORDER — FENTANYL CITRATE (PF) 100 MCG/2ML IJ SOLN
INTRAMUSCULAR | Status: DC | PRN
  Administered 2019-08-25: 25 ug via INTRAVENOUS
  Administered 2019-08-25: 50 ug via INTRAVENOUS

## 2019-08-25 MED ORDER — CHLORHEXIDINE GLUCONATE CLOTH 2 % EX PADS: 6.0000 | MEDICATED_PAD | Freq: Every day | CUTANEOUS | Status: DC

## 2019-08-25 MED ORDER — LIDOCAINE HCL (PF) 1 % IJ SOLN
INTRAMUSCULAR | Status: AC
  Filled 2019-08-25: qty 30

## 2019-08-25 MED ORDER — MUPIROCIN 2 % EX OINT
1.0000 "application " | TOPICAL_OINTMENT | Freq: Two times a day (BID) | CUTANEOUS | Status: DC
  Administered 2019-08-25 – 2019-08-26 (×3): 1 via NASAL
  Filled 2019-08-25 (×3): qty 22

## 2019-08-25 SURGICAL SUPPLY — 53 items
ADH SKN CLS APL DERMABOND .7 (GAUZE/BANDAGES/DRESSINGS) ×2
ARMBAND PINK RESTRICT EXTREMIT (MISCELLANEOUS) ×4 IMPLANT
BAG DECANTER FOR FLEXI CONT (MISCELLANEOUS) ×4 IMPLANT
BIOPATCH RED 1 DISK 7.0 (GAUZE/BANDAGES/DRESSINGS) ×3 IMPLANT
BIOPATCH RED 1IN DISK 7.0MM (GAUZE/BANDAGES/DRESSINGS) ×1
CANISTER SUCT 3000ML PPV (MISCELLANEOUS) ×4 IMPLANT
CATH PALINDROME RT-P 15FX19CM (CATHETERS) ×2 IMPLANT
CATH PALINDROME RT-P 15FX23CM (CATHETERS) IMPLANT
CATH PALINDROME RT-P 15FX28CM (CATHETERS) IMPLANT
CATH PALINDROME RT-P 15FX55CM (CATHETERS) IMPLANT
CLIP VESOCCLUDE MED 6/CT (CLIP) ×4 IMPLANT
CLIP VESOCCLUDE SM WIDE 6/CT (CLIP) ×4 IMPLANT
COVER PROBE W GEL 5X96 (DRAPES) ×4 IMPLANT
COVER SURGICAL LIGHT HANDLE (MISCELLANEOUS) ×4 IMPLANT
COVER WAND RF STERILE (DRAPES) ×4 IMPLANT
DERMABOND ADVANCED (GAUZE/BANDAGES/DRESSINGS) ×2
DERMABOND ADVANCED .7 DNX12 (GAUZE/BANDAGES/DRESSINGS) ×2 IMPLANT
DRAPE C-ARM 42X72 X-RAY (DRAPES) ×4 IMPLANT
DRAPE CHEST BREAST 15X10 FENES (DRAPES) ×4 IMPLANT
ELECT REM PT RETURN 9FT ADLT (ELECTROSURGICAL) ×4
ELECTRODE REM PT RTRN 9FT ADLT (ELECTROSURGICAL) ×2 IMPLANT
GAUZE 4X4 16PLY RFD (DISPOSABLE) ×4 IMPLANT
GLOVE BIO SURGEON STRL SZ7.5 (GLOVE) ×4 IMPLANT
GOWN STRL REUS W/ TWL LRG LVL3 (GOWN DISPOSABLE) ×4 IMPLANT
GOWN STRL REUS W/ TWL XL LVL3 (GOWN DISPOSABLE) ×2 IMPLANT
GOWN STRL REUS W/TWL LRG LVL3 (GOWN DISPOSABLE) ×8
GOWN STRL REUS W/TWL XL LVL3 (GOWN DISPOSABLE) ×4
INSERT FOGARTY SM (MISCELLANEOUS) IMPLANT
KIT BASIN OR (CUSTOM PROCEDURE TRAY) ×4 IMPLANT
KIT TURNOVER KIT B (KITS) ×4 IMPLANT
NDL 18GX1X1/2 (RX/OR ONLY) (NEEDLE) ×2 IMPLANT
NDL HYPO 25GX1X1/2 BEV (NEEDLE) ×2 IMPLANT
NEEDLE 18GX1X1/2 (RX/OR ONLY) (NEEDLE) ×4 IMPLANT
NEEDLE HYPO 25GX1X1/2 BEV (NEEDLE) ×4 IMPLANT
NS IRRIG 1000ML POUR BTL (IV SOLUTION) ×4 IMPLANT
PACK CV ACCESS (CUSTOM PROCEDURE TRAY) ×4 IMPLANT
PACK SURGICAL SETUP 50X90 (CUSTOM PROCEDURE TRAY) ×4 IMPLANT
PAD ARMBOARD 7.5X6 YLW CONV (MISCELLANEOUS) ×8 IMPLANT
SOAP 2 % CHG 4 OZ (WOUND CARE) ×4 IMPLANT
SUT ETHILON 3 0 PS 1 (SUTURE) ×4 IMPLANT
SUT MNCRL AB 4-0 PS2 18 (SUTURE) ×4 IMPLANT
SUT PROLENE 6 0 BV (SUTURE) ×4 IMPLANT
SUT VIC AB 3-0 SH 27 (SUTURE) ×4
SUT VIC AB 3-0 SH 27X BRD (SUTURE) ×2 IMPLANT
SYR 10ML LL (SYRINGE) ×4 IMPLANT
SYR 20ML LL LF (SYRINGE) ×8 IMPLANT
SYR 5ML LL (SYRINGE) ×4 IMPLANT
SYR CONTROL 10ML LL (SYRINGE) ×4 IMPLANT
TOWEL GREEN STERILE (TOWEL DISPOSABLE) ×4 IMPLANT
TOWEL GREEN STERILE FF (TOWEL DISPOSABLE) ×8 IMPLANT
UNDERPAD 30X30 (UNDERPADS AND DIAPERS) ×4 IMPLANT
WATER STERILE IRR 1000ML POUR (IV SOLUTION) ×4 IMPLANT
WIRE EMERALD 3MM-J .035X150CM (WIRE) ×2 IMPLANT

## 2019-08-25 NOTE — Transfer of Care (Signed)
Immediate Anesthesia Transfer of Care Note  Patient: [name] [name]  Procedure(s) Performed: ARTERIOVENOUS (AV) BRACHIOCEPHALIC FISTULA CREATION (Left Arm Upper) INSERTION OF DIALYSIS CATHETER (Right Neck)  Patient Location: PACU  Anesthesia Type:MAC  Level of Consciousness: awake  Airway & Oxygen Therapy: Patient Spontanous Breathing and Patient connected to face mask oxygen  Post-op Assessment: Report given to RN and Post -op Vital signs reviewed and stable  Post vital signs: Reviewed and stable  Last Vitals:  Vitals Value Taken Time  BP    Temp    Pulse 67 08/25/19 1333  Resp 24 08/25/19 1333  SpO2 100 % 08/25/19 1333  Vitals shown include unvalidated device data.  Last Pain:  Vitals:   08/25/19 0817  TempSrc:   PainSc: 0-No pain      Patients Stated Pain Goal: 0 (91/79/15 0569)  Complications: No apparent anesthesia complications

## 2019-08-25 NOTE — Progress Notes (Signed)
  Progress Note    08/25/2019 11:02 AM Day of Surgery  Subjective:  No overnight complaints  Vitals:   08/25/19 0443 08/25/19 0816  BP: (!) 146/75 (!) 144/99  Pulse: 72 71  Resp: 18   Temp: 99.3 F (37.4 C)   SpO2: 96% 96%    Physical Exam: aaox3 Non labored respirations Temp catheter in right neck Palpable left radial pulse  CBC    Component Value Date/Time   WBC 5.5 08/25/2019 0433   RBC 4.86 08/25/2019 0433   HGB 12.0 08/25/2019 0433   HCT 41.1 08/25/2019 0433   PLT 231 08/25/2019 0433   MCV 84.6 08/25/2019 0433   MCH 24.7 (L) 08/25/2019 0433   MCHC 29.2 (L) 08/25/2019 0433   RDW 20.8 (H) 08/25/2019 0433   LYMPHSABS 1.2 08/21/2019 0340   MONOABS 0.9 08/21/2019 0340   EOSABS 0.5 08/21/2019 0340   BASOSABS 0.0 08/21/2019 0340    BMET    Component Value Date/Time   NA 141 08/25/2019 0433   K 4.4 08/25/2019 0433   CL 105 08/25/2019 0433   CO2 24 08/25/2019 0433   GLUCOSE 108 (H) 08/25/2019 0433   BUN 62 (H) 08/25/2019 0433   CREATININE 4.58 (H) 08/25/2019 0433   CREATININE 2.62 (H) 04/29/2016 1629   CALCIUM 9.0 08/25/2019 0433   GFRNONAA 9 (L) 08/25/2019 0433   GFRAA 11 (L) 08/25/2019 0433    INR    Component Value Date/Time   INR 1.2 08/25/2019 0433     Intake/Output Summary (Last 24 hours) at 08/25/2019 1102 Last data filed at 08/25/2019 1008 Gross per 24 hour  Intake 920 ml  Output 1350 ml  Net -430 ml     Assessment:  68 y.o. female is in need of permanent hd access  Plan: OR today for tdc and left arm access creation.   Brandon C. Donzetta Matters, MD [facility] Office: [phone] Pager: [phone]  08/25/2019 11:02 AM

## 2019-08-25 NOTE — Progress Notes (Signed)
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  She was found to have acute on chronic kidney disease with an increase in creatinine from baseline of 3.57  05/2019 to 6.11 milligrams per deciliter on admission.  BUN increased to 132.  She is also noted to be acutely anemic and underwent EGD that showed esophagitis with strictures.  She follows with Dr. Joelyn Oms as an outpatient at [facility].  A decision was made that she had progressed to end-stage renal disease and dialysis was initiated with a non-tunneled dialysis catheter 08/19/2019.  She is scheduled for dialysis 08/25/2019.  She tolerated dialysis well 08/23/2019 with removal of 3 L.  Vein mapping has been ordered.  Recurrent fevers have resolved.  Appreciate assistance from Dr. Oneida Alar.  Left arm AV fistula and tunneled dialysis catheter placed on Thursday by Dr. Donzetta Matters.  Appreciate assistance from [facility] TTS a.m. outpatient slot.  Blood pressure 144/99 pulse 71 temperature 99 O2 sats 96% room air  Sodium 141 potassium 4.4 chloride 105 CO2 24 BUN 62 creatinine 4.58 glucose 108 calcium 9.0 WBC 5.5 hemoglobin 12 platelets 231  Allopurinol 100 mg daily, amlodipine 5 mg daily Lipitor 40 mg daily, Wellbutrin 150 mg twice daily, darbepoetin 100 mcg last administered 08/25/2019, gabapentin 300 mg daily, Linzess 72 mcg daily, metoprolol 12.5 mg twice daily, Protonix 40 mg daily.   Last chest x-ray 08/20/2019 showed cardiomegaly with mild interstitial edema.  CT scan of abdomen and pelvis showed no acute findings.  Aortic atherosclerosis with mild aneurysmal dilatation of the infrarenal aortic aneurysm measuring 3.1 cm small hiatal hernia and degenerative scoliosis.   Renal ultrasound showed no evidence of hydronephrosis  Objective:  Vital signs in last 24 hours:  Temp:  [98.7 F (37.1 C)-99.3 F (37.4 C)] 99.3 F (37.4 C) (08/27 0443) Pulse Rate:  [65-72] 71 (08/27 0816) Resp:  [17-18] 18 (08/27 0443) BP: (127-146)/(53-99) 144/99 (08/27 0816) SpO2:  [96 %-98 %] 96 % (08/27 0816) Weight:  [124.8 kg] 124.8 kg (08/27 0443)  Weight change: -2.27 kg Filed Weights   08/23/19 1048 08/24/19 0526 08/25/19 0443  Weight: 124 kg 124.2 kg 124.8 kg    Intake/Output: I/O last 3 completed shifts: In: 1360 [P.O.:1360] Out: 950 [Urine:950]   Intake/Output this shift:  No intake/output data recorded.  General adult female in bed in no acute distress HEENT NCAT/ EOMI Neck supple trachea midline Lungs clear to auscultation bilaterally normal work of breathing at rest ; room air  Heart RRR no rub Abdomen soft nontender nondistended Extremities trace edema  Psych normal mood and affect Neuro - alert and oriented x 3 conversant  GU - has foley  Access: RIJ nontunneled dialysis catheter    Basic Metabolic Panel: Recent Labs  Lab 08/20/19 0228 08/21/19 0340 08/22/19 0443 08/23/19 0254 08/24/19 0401 08/25/19 0433  NA 141 141 141 139 141 141  K 4.2 4.0 3.9 4.3 4.1 4.4  CL 107 109 106 105 105 105  CO2 20* 22 24 23 26 24   GLUCOSE 164* 88 115* 124* 125* 108*  BUN 86* 91* 56* 71* 54* 62*  CREATININE 5.17* 5.21* 3.66* 4.40* 3.98* 4.58*  CALCIUM 8.5* 8.7* 8.5* 8.6* 8.7* 9.0  PHOS 4.0 4.2 2.7 3.7 3.8  --  Liver Function Tests: Recent Labs  Lab 08/18/19 0932 08/19/19 0339 08/20/19 0228 08/21/19 0340 08/22/19 0443 08/23/19 0254 08/24/19 0401  AST 12* 12*  --   --   --   --   --   ALT 9 11  --   --   --   --   --   ALKPHOS 103 107  --   --   --   --   --   BILITOT 0.7 0.3  --   --   --   --   --   PROT 5.7* 6.5  --   --   --   --   --   ALBUMIN 2.8* 3.1* 2.7* 2.6* 2.6* 2.5* 2.5*   No results for input(s): LIPASE, AMYLASE in the last 168  hours. No results for input(s): AMMONIA in the last 168 hours.  CBC: Recent Labs  Lab 08/21/19 0340 08/22/19 0443 08/23/19 0340 08/24/19 0401 08/25/19 0433  WBC 10.6* 9.1 8.7 8.5 5.5  NEUTROABS 7.8*  --   --   --   --   HGB 7.6* 8.7* 8.5* 8.4* 12.0  HCT 25.6* 29.1* 28.3* 28.9* 41.1  MCV 82.1 83.6 83.5 85.3 84.6  PLT 315 293 287 266 231    Cardiac Enzymes: No results for input(s): CKTOTAL, CKMB, CKMBINDEX, TROPONINI in the last 168 hours.  BNP: Invalid input(s): POCBNP  CBG: Recent Labs  Lab 08/24/19 0650 08/24/19 1126 08/24/19 1627 08/24/19 2059 08/25/19 0550  GLUCAP 117* 187* 210* 140* 133*    Microbiology: Results for orders placed or performed during the hospital encounter of 08/16/19  SARS Coronavirus 2 [facility] order, Performed in [facility] hospital lab) Nasopharyngeal Nasopharyngeal Swab     Status: None   Collection Time: 08/16/19  2:34 PM   Specimen: Nasopharyngeal Swab  Result Value Ref Range Status   SARS Coronavirus 2 NEGATIVE NEGATIVE Final    Comment: (NOTE) If result is NEGATIVE SARS-CoV-2 target nucleic acids are NOT DETECTED. The SARS-CoV-2 RNA is generally detectable in upper and lower  respiratory specimens during the acute phase of infection. The lowest  concentration of SARS-CoV-2 viral copies this assay can detect is 250  copies / mL. A negative result does not preclude SARS-CoV-2 infection  and should not be used as the sole basis for treatment or other  patient management decisions.  A negative result may occur with  improper specimen collection / handling, submission of specimen other  than nasopharyngeal swab, presence of viral mutation(s) within the  areas targeted by this assay, and inadequate number of viral copies  (<250 copies / mL). A negative result must be combined with clinical  observations, patient history, and epidemiological information. If result is POSITIVE SARS-CoV-2 target nucleic acids are DETECTED. The  SARS-CoV-2 RNA is generally detectable in upper and lower  respiratory specimens dur ing the acute phase of infection.  Positive  results are indicative of active infection with SARS-CoV-2.  Clinical  correlation with patient history and other diagnostic information is  necessary to determine patient infection status.  Positive results do  not rule out bacterial infection or co-infection with other viruses. If result is PRESUMPTIVE POSTIVE SARS-CoV-2 nucleic acids MAY BE PRESENT.   A presumptive positive result was obtained on the submitted specimen  and confirmed on repeat testing.  While 2019 novel coronavirus  (SARS-CoV-2) nucleic acids may be present in the submitted sample  additional confirmatory testing may be necessary for epidemiological  and / or clinical management purposes  to differentiate between  SARS-CoV-2 and other Sarbecovirus currently known to infect humans.  If clinically indicated additional testing with an alternate test  methodology [phone]) is advised. The SARS-CoV-2 RNA is generally  detectable in upper and lower respiratory sp ecimens during the acute  phase of infection. The expected result is Negative. Fact Sheet for Patients:  StrictlyIdeas.no Fact Sheet for Healthcare Providers: BankingDealers.co.za This test is not yet approved or cleared by the [place] FDA and has been authorized for detection and/or diagnosis of SARS-CoV-2 by FDA under an Emergency Use Authorization (EUA).  This EUA will remain in effect (meaning this test can be used) for the duration of the COVID-19 declaration under Section 564(b)(1) of the Act, 21 U.S.C. section 360bbb-3(b)(1), unless the authorization is terminated or revoked sooner. Performed at [facility], [place] [address], [place], [place] [address]   Culture, Urine     Status: None   Collection Time: 08/19/19  7:12 AM   Specimen: Urine, Random  Result Value Ref  Range Status   Specimen Description URINE, RANDOM  Final   Special Requests NONE  Final   Culture   Final    NO GROWTH Performed at [facility], [place] [address] [place], [place] [address]    Report Status 08/20/2019 FINAL  Final  Culture, blood (routine x 2)     Status: None   Collection Time: 08/19/19  7:14 AM   Specimen: BLOOD LEFT HAND  Result Value Ref Range Status   Specimen Description BLOOD LEFT HAND  Final   Special Requests   Final    BOTTLES DRAWN AEROBIC ONLY Blood Culture adequate volume   Culture   Final    NO GROWTH 5 DAYS Performed at [facility], [place] [address] [place], [place] [address]    Report Status 08/24/2019 FINAL  Final  Culture, blood (routine x 2)     Status: None   Collection Time: 08/19/19  7:14 AM   Specimen: BLOOD LEFT HAND  Result Value Ref Range Status   Specimen Description BLOOD LEFT HAND  Final   Special Requests   Final    BOTTLES DRAWN AEROBIC ONLY Blood Culture adequate volume   Culture   Final    NO GROWTH 5 DAYS Performed at [facility], [place] [address], [place], [place] [address]    Report Status 08/24/2019 FINAL  Final  Surgical pcr screen     Status: Abnormal   Collection Time: 08/24/19 10:01 PM   Specimen: Nasal Mucosa; Nasal Swab  Result Value Ref Range Status   MRSA, PCR NEGATIVE NEGATIVE Final   Staphylococcus aureus POSITIVE (A) NEGATIVE Final    Comment: (NOTE) The Xpert SA Assay (FDA approved for NASAL specimens in patients 15 years of age and older), is one component of a comprehensive surveillance program. It is not intended to diagnose infection nor to guide or monitor treatment. Performed at [facility], [place] [address] [place], [place] [address]   SARS CORONAVIRUS 2 (TAT 6-12 HRS) Nasal Swab Aptima Multi Swab     Status: None   Collection Time: 08/24/19 10:04 PM   Specimen: Aptima Multi Swab; Nasal Swab  Result Value Ref Range Status   SARS Coronavirus 2 NEGATIVE NEGATIVE Final     Comment: (NOTE) SARS-CoV-2 target nucleic acids are NOT DETECTED. The SARS-CoV-2 RNA is generally detectable in upper and lower respiratory specimens during the acute phase of infection. Negative results do not preclude SARS-CoV-2  infection, do not rule out co-infections with other pathogens, and should not be used as the sole basis for treatment or other patient management decisions. Negative results must be combined with clinical observations, patient history, and epidemiological information. The expected result is Negative. Fact Sheet for Patients: SugarRoll.be Fact Sheet for Healthcare Providers: https://www.woods-mathews.com/ This test is not yet approved or cleared by the [place] FDA and  has been authorized for detection and/or diagnosis of SARS-CoV-2 by FDA under an Emergency Use Authorization (EUA). This EUA will remain  in effect (meaning this test can be used) for the duration of the COVID-19 declaration under Section 56 4(b)(1) of the Act, 21 U.S.C. section 360bbb-3(b)(1), unless the authorization is terminated or revoked sooner. Performed at [facility], [place] [address] [place], [place] [address]     Coagulation Studies: Recent Labs    08/25/19 0433  LABPROT 15.2  INR 1.2    Urinalysis: No results for input(s): COLORURINE, LABSPEC, PHURINE, GLUCOSEU, HGBUR, BILIRUBINUR, KETONESUR, PROTEINUR, UROBILINOGEN, NITRITE, LEUKOCYTESUR in the last 72 hours.  Invalid input(s): APPERANCEUR    Imaging: No results found.   Medications:   . cefUROXime (ZINACEF)  IV     . allopurinol  100 mg Oral Daily  . amLODipine  5 mg Oral Daily  . atorvastatin  40 mg Oral q1800  . brinzolamide  1 drop Both Eyes BID  . buPROPion  150 mg Oral BID  . Chlorhexidine Gluconate Cloth  6 each Topical Daily  . darbepoetin (ARANESP) injection - DIALYSIS  100 mcg Intravenous Q Thu-HD  . gabapentin  300 mg Oral Daily  . insulin  aspart  0-15 Units Subcutaneous TID WC  . insulin aspart  0-5 Units Subcutaneous QHS  . insulin aspart  3 Units Subcutaneous TID WC  . latanoprost  1 drop Both Eyes QHS  . linaclotide  72 mcg Oral QAC breakfast  . metoprolol tartrate  12.5 mg Oral BID  . mupirocin ointment  1 application Nasal BID  . pantoprazole  40 mg Oral Q0600   acetaminophen **OR** acetaminophen, barrier cream, ipratropium-albuterol, lidocaine (PF), mometasone-formoterol, sodium chloride flush  Assessment/ Plan:   New end-stage renal disease with progression from stage IV/V chronic kidney disease.  Initiated hemodialysis through non-tunneled dialysis catheter placed by interventional radiology 08/19/2019.  She underwent successful dialysis 08/23/2019 with removal of 2.9 l vein mapping has been ordered.  Appreciate assistance from Dr. Oneida Alar. Placement of fistula and permanent dialysis catheter 08/25/2019.  Her next dialysis session will be scheduled for 08/25/2019.  She will have this after placement of her permanent dialysis catheter.  She is scheduled for outpatient dialysis at [facility] Tuesday Thursday Saturday a.m. slot  Anemia status post GI blood loss continues on Feraheme and darbepoetin.  She also received packed red blood cells total of 3 units 08/17/2019 08/18/2019 and 08/21/2019.  Urinary retention continues to bladder scan  Fever work-up per primary team blood cultures NGTD urine culture NGTD  Hypertension controlled  Metabolic acidosis transition to hemodialysis  Diabetes mellitus per primary team  History of falls at home no fractures.  Urine drug screen positive for cocaine  Aortic atherosclerosis with a 3.1 cm aneurysm follow-up ultrasound recommended for 3 years.   LOS: [place] @TODAY @9 :31 AM

## 2019-08-25 NOTE — Progress Notes (Signed)
Physical Therapy Treatment Patient Details Name: [name] [name] MRN: [id number] DOB: 1951-11-04 Today's Date: 08/25/2019    History of Present Illness Patient is a 68 year old female admitted with anemia.HD fistua 8/27.PMH to include DM, CAD s/p CABG, CKD, HF.    PT Comments    Pt very pleasant and willing to walk and climb stairs today. Pt with increased ambulation distance, HEP tolerance and ability to perform stairs with bil rails. Pt educated for possibility to not be allowed to pulling on both railings after fistula placed and she may have to modify how she gets up stairs. Pt also educated for continued walking and HEP programs and she states she already discussed this with her husband who supposedly is going to make her move and walk more. Will continue to follow.  HR 112 with gait    Follow Up Recommendations  Home health PT     Equipment Recommendations  Rolling walker with 5" wheels    Recommendations for Other Services       Precautions / Restrictions Precautions Precautions: Fall    Mobility  Bed Mobility Overal bed mobility: Modified Independent Bed Mobility: Supine to Sit           General bed mobility comments: increased time, HOB 25. Return to EOB end of session  Transfers Overall transfer level: Modified independent               General transfer comment: pt able to perform transfers at bed without assist  Ambulation/Gait Ambulation/Gait assistance: Supervision Gait Distance (Feet): 90 Feet Assistive device: Rolling walker (2 wheeled) Gait Pattern/deviations: Step-through pattern;Decreased stride length;Trunk flexed   Gait velocity interpretation: >2.62 ft/sec, indicative of community ambulatory General Gait Details: cues for posture and proximity to RW, limited by fatigue   Stairs Stairs: Yes Stairs assistance: Supervision Stair Management: Step to pattern;Forwards;Two rails Number of Stairs: 11 General stair comments: reliance on bil UE  for pulling up stairs but safe with use of support   Wheelchair Mobility    Modified Rankin (Stroke Patients Only)       Balance Overall balance assessment: Needs assistance Sitting-balance support: Feet supported;No upper extremity supported Sitting balance-Leahy Scale: Good     Standing balance support: During functional activity;Bilateral upper extremity supported Standing balance-Leahy Scale: Poor                              Cognition Arousal/Alertness: Awake/alert Behavior During Therapy: WFL for tasks assessed/performed Overall Cognitive Status: Within Functional Limits for tasks assessed                                        Exercises General Exercises - Lower Extremity Long Arc Quad: AROM;Both;Seated;20 reps Hip Flexion/Marching: AROM;Both;Seated;15 reps Toe Raises: AROM;Both;Seated;20 reps Heel Raises: AROM;Both;Seated;20 reps    General Comments        Pertinent Vitals/Pain Pain Assessment: No/denies pain    Home Living                      Prior Function            PT Goals (current goals can now be found in the care plan section) Progress towards PT goals: Progressing toward goals    Frequency           PT Plan Current plan remains appropriate  Co-evaluation              AM-PAC PT "6 Clicks" Mobility   Outcome Measure  Help needed turning from your back to your side while in a flat bed without using bedrails?: None Help needed moving from lying on your back to sitting on the side of a flat bed without using bedrails?: A Little Help needed moving to and from a bed to a chair (including a wheelchair)?: None Help needed standing up from a chair using your arms (e.g., wheelchair or bedside chair)?: None Help needed to walk in hospital room?: A Little Help needed climbing 3-5 steps with a railing? : A Little 6 Click Score: 21    End of Session Equipment Utilized During Treatment: Gait  belt Activity Tolerance: Patient tolerated treatment well Patient left: in bed;with call bell/phone within reach Nurse Communication: Mobility status PT Visit Diagnosis: Muscle weakness (generalized) (M62.81);Difficulty in walking, not elsewhere classified (R26.2);History of falling (Z91.81)     Time: 6701-1003 PT Time Calculation (min) (ACUTE ONLY): 17 min  Charges:  $Gait Training: 8-22 mins                     [place] Pam Drown, PT Acute Rehabilitation Services Pager: [phone] Office: [place] 08/25/2019, 11:57 AM

## 2019-08-25 NOTE — Discharge Summary (Addendum)
Name: [name] [name] MRN: [id number] DOB: 1951-09-10 68 y.o. PCP: [facility]  Date of Admission: 08/16/2019  3:19 AM Date of Discharge: 08/26/2019 Attending Physician: Oda Kilts, MD  Discharge Diagnosis: 1. Acute on chronic anemia 2/2 blood loss 2. 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   amLODipine 5 MG tablet Commonly known as: NORVASC Take 1 tablet (5 mg total) by mouth daily.   aspirin EC 325 MG tablet Take 325 mg by mouth daily.   atorvastatin 40 MG tablet Commonly known as: LIPITOR Take 1 tablet (40 mg total) by mouth daily at 6 PM.   brinzolamide 1 % ophthalmic suspension Commonly known as: AZOPT Place 1 drop into both eyes 2 (two) times a day.   buPROPion 150 MG 12 hr tablet Commonly known as: WELLBUTRIN SR Take 1 tablet (150 mg total) by mouth 2 (two) times daily.   cetirizine 10 MG tablet Commonly known as: ZYRTEC Take 10 mg by mouth daily.   ferrous sulfate 325 (65 FE) MG tablet Take 325 mg by mouth 3 (three) times daily with meals.   gabapentin 300 MG capsule Commonly known as: NEURONTIN Take 1 capsule (300 mg total) by mouth daily. What changed: when to take this   insulin lispro 100 UNIT/ML KwikPen Commonly known as: HumaLOG KwikPen Inject 0.02 mLs (2 Units total) into the skin 3 (three) times daily with meals.   ipratropium 17 MCG/ACT inhaler Commonly known as: ATROVENT HFA Inhale 2 puffs into the lungs every 6 (six) hours as needed for wheezing.   latanoprost 0.005  % ophthalmic solution Commonly known as: XALATAN Place 1 drop into both eyes at bedtime.   Linzess 72 MCG capsule Generic drug: linaclotide Take 72 mcg by mouth daily before breakfast.   metoprolol tartrate 25 MG tablet Commonly known as: LOPRESSOR Take 25 mg by mouth 2 (two) times daily.   mometasone-formoterol 100-5 MCG/ACT Aero Commonly known as: DULERA Inhale 2 puffs into the lungs every 4 (four) hours as needed for wheezing.   nitroGLYCERIN 0.4 MG SL tablet Commonly known as: NITROSTAT Place 1 tablet (0.4 mg total) under the tongue every 5 (five) minutes as needed for chest pain.   pantoprazole 40 MG tablet Commonly known as: PROTONIX Take 1 tablet (40 mg total) by mouth daily.   polyethylene glycol 17 g packet Commonly known as: MIRALAX / GLYCOLAX Take 17 g by mouth daily as needed for mild constipation.   ProAir HFA 108 (90 Base) MCG/ACT inhaler Generic drug: albuterol Inhale 2 puffs into the lungs every 6 (six) hours as needed for wheezing.   Wixela Inhub 250-50 MCG/DOSE Aepb Generic drug: Fluticasone-Salmeterol Inhale 2 puffs into the lungs daily.            Durable Medical Equipment  (From admission, onward)         Start     Ordered   08/26/19 1420  For home use only DME Walker rolling  [employer])  Once    Question:  Patient needs a walker to treat with the following condition  Answer:  Gait instability   08/26/19 1420          Disposition and follow-up:   [name] was discharged from [facility] in Stable condition.  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 EGD negative for source. Colonoscopy deferred. Iron panel consistent with IDA. Patient received 3U pRBC during admission with appropriate Hb response. At follow up, please ensure patient is continued on Protonix and has a close follow up with GI. Also f/u with CBC.    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 She is set up for outpatient HD on TTS schedule at [facility]. Please ensure patient is going to dialysis sessions and discuss any barriers to compliance and ensure patient has follow up with nephrology. 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 Recommend followup by ultrasound in 3 years.  2.  Labs / imaging needed at time of follow-up: CBC, Renal function panel   3.  Pending labs/ test needing follow-up: none   Follow-up Appointments: Follow-up Information    Waynetta Sandy, MD Follow up in 5 week(s).   Specialties: Vascular Surgery, Cardiology Contact information: [place] [address] [phone]        [place]. Schedule an appointment as soon as possible for a visit in 1 week.   Why: Left message with office to call patient Contact information: [place] [address] [phone]        [facility]. Go on 08/27/2019.   Why: at 6:40am Contact information: [place] [address] [phone]        [place], [place]. Schedule an appointment as soon as possible for a visit in 1 week(s).   Contact information: [place] [address] [phone]        Gatha Mayer, MD. Go on 09/07/2019.   Specialty: Gastroenterology Why: @1 :30 with Dr. Letta Pate information: [address]. [place] [address] [phone]        [facility] Follow up.   Specialty: Home Health Services Why: RN, PT, [place] Hospital Course by problem list:  ASSESSMENT: Patient is 68yo female with PMHx of CAD s/p CABG in 2003, CKD5 >> ESRD,Colonic AVMswho presentedwith acute on chronic anemia and A/C Renal  Failure. EGD on 8/20 negative for acute upper GI bleeding.Non-tunneledHDcatheter placed with IR and first HD sessionson 8/21.She is s/p3U pRBC since admission with appropriate response in Hgb.She had a unneled HD catheter placed and L arm AV fistula created by vascular surgery on 8/27. Patient scheduled for HD outpatient on TTS schedule at [facility].   Acute blood loss anemia on chronic anemia: Known history of colonic AVMs with baseline Hb 9-11,Hb 7.3>>7.0on admit.EGDnegative for source.Colonoscopy deferred unless overt bleeding.Iron studies 8/19: Fe 19, Ferritin 14,TIBC 307, Saturation ratio 6%, consistent with IDA. S/PIV Fe (8/19) and ESA(8/20), 3u pRBCtotal with appropriate response in Hb. Patient continued on Protonix 40mg  qd and recommendation for close follow up with GI for colonoscopy. Aranesp per Nephrology  CKDVProgression to ESRD: Prior baselineCr3.3-3.4 BUN/Cr 132/6.1 on admission.FENa 1.8% suggests intrinsic etiology.Temporary HD cath placed8/21.StartedHD on 8/21 has tolerated well thus far.Patient for AV fistula and tunneled catheter placement with Dr. Donzetta Matters on 8/27 and HD after. Patient placed for OP HD at [facility] on TTS morning schedule. Patient will start in clinic on Saturday 8/29. Will need to go to dialysis center 8/28, 2:30-5:00pm to complete intake paperwork tomorrow to start on Saturday.  Given CrCl <30 mL/min, will decrease outpatient gabapentin dose to 300mg  qd for neuropathy.    Feverof unknown etiology: Fever of 101-102on 8/20 and 8/21w/ mild leukokcytosis. Afebrile since,WBC WNL since 8/24. U/A w/bacteruria but no WBC; CXR w/o PNA;Urine and blood cultures negative. No antibiotics given during stay.    Discharge Vitals:   BP (!) 130/58    Pulse 73    Temp 98.2 F (36.8 C) (Oral)    Resp 18    Ht 5\' 8"  (1.727 m)    Wt (!) 271.4 kg    SpO2 98%    BMI 90.98 kg/m   Pertinent Labs, Studies, and  Procedures:   CT ABDOMEN PELVIS WO CONTRAST 08/16/2019:  IMPRESSION: 1. No acute findings within the abdomen or pelvis. 2. No fracture or dislocation within the lumbar spine, sacrum or osseous pelvis. 3. Aortic atherosclerosis. 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 Recommend followup by ultrasound in 3 years. This recommendation follows ACR consensus guidelines: White Paper of the ACR Incidental Findings Committee II on Vascular Findings. J Am Coll Radiol 2013; 10:789-794. Aortic aneurysm NOS 4. Small hiatal hernia. 5. Degenerative spondylosis of the lumbar spine, mild to moderate in degree with associated neural foramen encroachments at the L5-S1 level. If symptoms could be radiculopathic in nature, would consider nonemergent lumbar spine MRI to exclude associated nerve root Impingement.  HIP AND KNEE X-RAY RESULTS 08/16/2019:  Negative for fracture or dislocation or joint effusion.   IR US GUIDED VASC ACCESS RIGHT 08/19/2019:  IMPRESSION: Placement of non-tunneled central venous hemodialysis catheter via the right internal jugular vein. The catheter tip lies at the cavoatrial junction. The catheter is ready for immediate use.  CXR  08/19/2019:  IMPRESSION: Cardiomegaly with mild interstitial pulmonary edema. New right IJ central venous catheter tip projects over the superior vena cava.  FLUORO GUIDED CV LINE PLACEMENT ON 08/25/2019 CXR 08/25/2019:  IMPRESSION: Interval placement of a large-bore right neck multi lumen vascular catheter, tip projecting near the superior cavoatrial junction. Unchanged mild, diffuse interstitial pulmonary opacity, likely edema. No new or focal airspace opacity. Cardiomegaly status post median sternotomy and CABG.  Discharge Instructions: Discharge Instructions    Call MD for:  persistant dizziness or light-headedness   Complete by: As directed    Call MD for:  persistant nausea and vomiting   Complete by: As  directed    Call MD for:  temperature >100.4   Complete by: As directed    Diet - low sodium heart healthy   Complete by: As directed    Discharge instructions   Complete by: As directed    [name], [name] [name] were admitted to the hospital after a fall and found to have acute anemia that was suspected from a GI bleed and a kidney injury.   You received 3 units of blood during your stay and your hemoglobin levels responded appropriately. Endoscopy did not show an upper GI bleed. Please schedule a follow up appointment with your GI doctor to schedule a colonoscopy at your  earliest convenience.   For your kidneys, we started you on hemodialysis during your hospital stay. You underwent 3 hemodialysis sessions during your stay, had a tunneled catheter and AV fistula placed. You are scheduled for regular outpatient hemodialysis at the [facility] on Tuesday, Thursday, and Saturday schedule at 7am. Your first session will be on Saturday 08/27/2019 at 7am and you will need to arrive at 6:40am to your appointment.  On Friday 08/26/2019, you will need to go to the [facility] after you leave the hospital to complete paperwork necessary to start your dialysis session on Saturday.   Please schedule a visit with your PCP in one week for a hospital follow up visit.   Thank you!   Discharge instructions   Complete by: As directed    You were seen in the hospital for a GI bleed and were found to have injury to your kidneys. It is important that you go to your hemodialysis session. We also want you to followup with a GI doctor who will be able to perform a colonoscopy and try to find what caused your bleeding. Please take your medications as prescribed and followup with your primary care doctor within the next week. Please call your doctor or return to the hospital if you notice bleeding, blood in your stools, chest pain, severe dizziness or any other concerning symptom.  Thank you for  allowing [place] to be part of your medical care   Increase activity slowly   Complete by: As directed    Increase activity slowly   Complete by: As directed      [name], [name] [name] were admitted to the hospital after a fall and found to have acute anemia that was suspected from a GI bleed and a kidney injury.   You received 3 units of blood during your stay and your hemoglobin levels responded appropriately. Endoscopy did not show an upper GI bleed. Please schedule a follow up appointment with your GI doctor to schedule a colonoscopy at your earliest convenience.   For your kidneys, we started you on hemodialysis during your hospital stay. You underwent 3 hemodialysis sessions during your stay, had a tunneled catheter and AV fistula placed. You are scheduled for regular outpatient hemodialysis at the [facility] on Tuesday, Thursday, and Saturday schedule at 7am. Your first session will be on Saturday 08/27/2019 at 7am and you will need to arrive at 6:40am to your appointment.  On Friday 08/26/2019, you will need to go to the [facility] after you leave the hospital to complete paperwork necessary to start your dialysis session on Saturday.   Please schedule a visit with your PCP in one week for a hospital follow up visit.   Your CT scan showed a mild aneurysm of the infrarenal abdominal  aorta, measuring 3.1 cm diameter.  The radiologist recommend followup by ultrasound in 3 years for monitoring.  Please let your primary care provider know about this next time you see them.   Signed: Jeanmarie Hubert, MD  Internal Medicine PGY1  08/26/2019, 3:09 PM   Pager: [phone]    Internal Medicine Attending Note:  I saw and examined the patient on the day of discharge. I reviewed and agree with the discharge summary written by the house staff.  Lenice Pressman, M.D., Ph.D.

## 2019-08-25 NOTE — Anesthesia Postprocedure Evaluation (Signed)
Anesthesia Post Note  Patient: [name] [name]  Procedure(s) Performed: ARTERIOVENOUS (AV) BRACHIOCEPHALIC FISTULA CREATION (Left Arm Upper) INSERTION OF DIALYSIS CATHETER (Right Neck)     Patient location during evaluation: PACU Anesthesia Type: MAC Level of consciousness: awake and alert, patient cooperative and oriented Pain management: pain level controlled Vital Signs Assessment: post-procedure vital signs reviewed and stable Respiratory status: spontaneous breathing, nonlabored ventilation, respiratory function stable and patient connected to nasal cannula oxygen Cardiovascular status: blood pressure returned to baseline and stable Postop Assessment: no apparent nausea or vomiting Anesthetic complications: no    Last Vitals:  Vitals:   08/25/19 1500 08/25/19 1530  BP: (!) 144/66 133/68  Pulse: 68 71  Resp: 18 18  Temp:    SpO2:      Last Pain:  Vitals:   08/25/19 1450  TempSrc: Oral  PainSc:                  Noura Purpura,E. Shahidah Nesbitt

## 2019-08-25 NOTE — Progress Notes (Signed)
  Date: 08/25/2019  Patient name: [name] [name]  Medical record number: [id number]  Date of birth: 1951-03-14   I have seen and evaluated this patient and I have discussed the plan of care with the house staff. Please see their note for complete details. I concur with their findings with the following additions/corrections:   Doing well today, plan for tunneled catheter and fistula today with vascular. She has a dialysis chair arranged. Will plan for potential discharge tomorrow if she is doing ok postop, and she will need to go by dialysis center for intake in the afternoon, then start Saturday morning.   Still no further fevers or bleeding. She will need GI follow up outpatient for colonoscopy.  Lenice Pressman, M.D., Ph.D. 08/25/2019, 2:22 PM

## 2019-08-25 NOTE — Anesthesia Preprocedure Evaluation (Addendum)
Anesthesia Evaluation  Patient identified by MRN, date of birth, ID band Patient awake    Reviewed: Allergy & Precautions, NPO status , Patient's Chart, lab work & pertinent test results  History of Anesthesia Complications Negative for: history of anesthetic complications  Airway Mallampati: II  TM Distance: >3 FB Neck ROM: Full    Dental  (+) Edentulous Upper, Edentulous Lower   Pulmonary COPD,  COPD inhaler, Current Smoker and Patient abstained from smoking.,  08/24/2019 SARS coronavirus NEG   breath sounds clear to auscultation       Cardiovascular hypertension, Pt. on medications and Pt. on home beta blockers (-) angina+ CAD, + Past MI and + CABG   Rhythm:Regular Rate:Normal  03/2019 ECHO: EF 45-50%, mild MR   Neuro/Psych CVA (L sided numbness), Residual Symptoms    GI/Hepatic GERD  Controlled,(+)     substance abuse  cocaine use,   Endo/Other  diabetes (glu 102), Oral Hypoglycemic Agents, Insulin DependentMorbid obesity  Renal/GU ESRF and DialysisRenal disease (creat 4.58, K+ 4.4)TuThSa dialysis     Musculoskeletal   Abdominal (+) + obese,   Peds  Hematology   Anesthesia Other Findings   Reproductive/Obstetrics                            Anesthesia Physical Anesthesia Plan  ASA: III  Anesthesia Plan: MAC   Post-op Pain Management:    Induction:   PONV Risk Score and Plan: 1 and Treatment may vary due to age or medical condition and Ondansetron  Airway Management Planned: Natural Airway and Simple Face Mask  Additional Equipment:   Intra-op Plan:   Post-operative Plan:   Informed Consent: I have reviewed the patients History and Physical, chart, labs and discussed the procedure including the risks, benefits and alternatives for the proposed anesthesia with the patient or authorized representative who has indicated his/her understanding and acceptance.       Plan  Discussed with: CRNA and Surgeon  Anesthesia Plan Comments:         Anesthesia Quick Evaluation

## 2019-08-25 NOTE — Op Note (Signed)
° ° °  Patient name: [name] [name] MRN: [id number] DOB: [birth year] Sex: female  08/25/2019 Pre-operative Diagnosis: End-stage renal disease Post-operative diagnosis:  Same Surgeon:  Erlene Quan C. Donzetta Matters, MD Assistant: Arlee Muslim, PA Procedure Performed: 1.  Right IJ 19 cm tunneled dialysis catheter placement with ultrasound guidance 2.  Left arm brachiocephalic AV fistula creation  Indications: 68 year old female currently dialyzing via temporary catheter.  She is in need of permanent access appears to have suitable cephalic vein on the left and she is right-hand dominant.  Findings: A tunnel dialysis catheter was placed in the right IJ ultrasound guidance using a new stick in the temporary catheter was removed.  Left arm brachiocephalic fistula was created.  The artery was 4 mm external diameter free of disease in the vein was 4 mm and at completion there was a strong thrill and a palpable radial pulse the wrist.   Procedure:  The patient was identified in the holding area and taken to the operating room where she placed upon operative when MAC anesthesia induced.  She was sterilely prepped and draped in the bilateral neck chest and left upper extremity in the usual fashion antibiotics were administered timeout was called.  Ultrasound was used to identify the cephalic vein in the left arm this area was anesthetized with 1% lidocaine with epinephrine.  Similarly the right internal jugular vein was identified the area was anesthetized 1% lidocaine with epinephrine.  The ultrasound was used to identify the IJ and cannulated with 18-gauge needle and a wire passed centrally.  A 19 cm catheter was tunneled from a counterincision.  The Catheter was removed that was cephalad to the new stick the wire tract was serially dilated and 19 cm catheter was placed under fluoroscopic guidance to the cavoatrial junction.  It was assembled flushed with heparinized saline locked with concentrated heparin 1.5 cc and on the  port.  We turned our attention to the left arm.  Transverse incision was made where the vein had previously been marked.  I dissected out the vein taking branches tween clips and ties.  Vein was marked for orientation.  We dissected through the deep fascia to the brachial artery placed Vesseloops around it.  Vein was transected and tied off distally.  The vein was spatulated flushed with heparinized saline.  Arteries clamped distally proximally opened longitudinally flushed with heparinized saline.  The vein was sewn inside with 6-0 Prolene suture.  Prior to completion anastomosis allowed flushing all direction.  Upon completion there was strong thrill in the vein.  We did free up some soft tissue using cautery and scissor.  There was a palpable radial pulse at the wrist.  Both these were confirmed with Doppler.  We irrigated the wound obtain hemostasis closed in layers Vicryl and Monocryl.  Dermabond placed to level the skin.  She was awakened anesthesia having tolerated procedure without immediate complication.  All counts were correct at completion.  EBL: 20 cc   Wayde Gopaul C. Donzetta Matters, MD [facility] Office: [phone] Pager: [phone]

## 2019-08-25 NOTE — Progress Notes (Signed)
Subjective: Patient evaluated at bedside this morning. She is in no acute distress and has not acute concerns. She is aware of the plan for surgery today for fistula and tunneled catheter and HD after. She was able to work with physical therapy yesterday. Patient is agreeable with planning for discharge for tomorrow.   Objective:  Vital signs in last 24 hours: Vitals:   08/24/19 1130 08/24/19 1935 08/25/19 0443 08/25/19 0816  BP: 137/69 (!) 127/53 (!) 146/75 (!) 144/99  Pulse: 72 65 72 71  Resp: 17 18 18    Temp: 98.8 F (37.1 C) 98.7 F (37.1 C) 99.3 F (37.4 C)   TempSrc: Oral Oral Oral   SpO2: 97% 98% 96% 96%  Weight:   124.8 kg   Height:       Physical Exam Constitutional:      General: She is not in acute distress.    Appearance: Normal appearance. She is obese.  Cardiovascular:     Rate and Rhythm: Normal rate and regular rhythm.     Pulses: Normal pulses.     Heart sounds: Normal heart sounds. No murmur. No friction rub. No gallop.   Pulmonary:     Effort: Pulmonary effort is normal. No respiratory distress.     Breath sounds: Normal breath sounds. No wheezing, rhonchi or rales.  Abdominal:     General: Bowel sounds are normal. There is no distension.     Palpations: Abdomen is soft.     Tenderness: There is no abdominal tenderness. There is no guarding.  Neurological:     General: No focal deficit present.     Mental Status: She is alert and oriented to person, place, and time. Mental status is at baseline.      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 EGD on 8/20 negative for acute upper GI bleeding.Non-tunneledHDcatheter placed with IR and first HD sessionson 8/21.She is s/p3U pRBC since admission with appropriate response in Hgb. Plan for tunneled HD catheter and L arm AV fistula tomorrow with [facility]VS.   Acute blood loss anemia  on chronic anemia: Known history of colonic AVMs with baseline Hb 9-11,Hb 7.3>>7.0on admit.EGDnegative for source.Colonoscopy deferred unless overt bleeding.Iron studies 8/19: Fe 19, Ferritin 14,TIBC 307, Saturation ratio 6%, consistent with IDA. S/PIV Fe (8/19) and ESA(8/20), 3u pRBCtotal. Hb 12.0 this AM.   - Aranesp per Nephrology - Continue to monitor with CBC - Continue Protonix 40mg  qd  CKDVProgression to ESRD: Prior baselineCr3.3-3.4 BUN/Cr 132/6.1 on admission.FENa 1.8% suggests intrinsic etiology.Temporary HD cath placed8/21.StartedHD on 8/21 has tolerated well thus far.Patient for AV fistula and tunneled catheter placement with Dr. Donzetta Matters today and HD after. Patient placed for OP HD at [facility] on TTS morning schedule. Patient will start in clinic on Saturday 8/29 if discharged tomorrow. Will need to go to dialysis center tomorrow 2:30-5:00pm to complete intake paperwork tomorrow to start on Saturday.   - Gabapentin 300mg  qd for neuropathy -Continue strict I/O's,Renal diet -Renal function paneldaily  Feverof unknown etiology: Fever of 101-102on 8/20 and 8/21w/ mild leukokcytosis. Afebrile since,WBC now WNL since 8/24. U/A w/bacteruria but no WBC; CXR w/o PNA;Urine and blood cultures negative. No antibiotics given.  - Continue to monitor   Mechanical falls w/o syncope: Two episodes of mechanical falls(tripped on bathroom rug) prior to admission. No trauma nor LOC. No syncopal or presyncopal symptoms preceding fall; did report  brieflightheadedness immediately upon standing.ng to walk. Reportedlanding on knee and back. Imaging negative. Somebruising on left hip noted. - PT/OTrecommend HH PT and rolling walker.  IDDM:Home Regimen:Lantus 21U, Novolog 3UTID.Last HbA1c 6.6 (03/2019). 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 F/U U/Sin 3 years.  Diet:Renal diet Fluids:None Code: Full code DVT Prophylaxis: SCDs  Dispo: Anticipated discharge in approximately 1 day(s).   Harvie Heck, MD  Internal Medicine PGY1 08/25/2019, 9:42 AM Pager: [phone]

## 2019-08-26 ENCOUNTER — Encounter (HOSPITAL_COMMUNITY): Payer: Self-pay | Admitting: Vascular Surgery

## 2019-08-26 DIAGNOSIS — Z886 Allergy status to analgesic agent status: Secondary | ICD-10-CM

## 2019-08-26 LAB — RENAL FUNCTION PANEL
Albumin: 2.9 g/dL — ABNORMAL LOW (ref 3.5–5.0)
Anion gap: 12 (ref 5–15)
BUN: 34 mg/dL — ABNORMAL HIGH (ref 8–23)
CO2: 26 mmol/L (ref 22–32)
Calcium: 8.8 mg/dL — ABNORMAL LOW (ref 8.9–10.3)
Chloride: 102 mmol/L (ref 98–111)
Creatinine, Ser: 3.66 mg/dL — ABNORMAL HIGH (ref 0.44–1.00)
GFR calc Af Amer: 14 mL/min — ABNORMAL LOW (ref >60)
GFR calc non Af Amer: 12 mL/min — ABNORMAL LOW (ref >60)
Glucose, Bld: 155 mg/dL — ABNORMAL HIGH (ref 70–99)
Phosphorus: 4.3 mg/dL (ref 2.5–4.6)
Potassium: 4 mmol/L (ref 3.5–5.1)
Sodium: 140 mmol/L (ref 135–145)

## 2019-08-26 LAB — CBC
HCT: 31.1 % — ABNORMAL LOW (ref 36.0–46.0)
Hemoglobin: 9.3 g/dL — ABNORMAL LOW (ref 12.0–15.0)
MCH: 25.1 pg — ABNORMAL LOW (ref 26.0–34.0)
MCHC: 29.9 g/dL — ABNORMAL LOW (ref 30.0–36.0)
MCV: 84.1 fL (ref 80.0–100.0)
Platelets: 251 10*3/uL (ref 150–400)
RBC: 3.7 MIL/uL — ABNORMAL LOW (ref 3.87–5.11)
RDW: 19.9 % — ABNORMAL HIGH (ref 11.5–15.5)
WBC: 9.2 10*3/uL (ref 4.0–10.5)
nRBC: 0.2 % (ref 0.0–0.2)

## 2019-08-26 LAB — GLUCOSE, CAPILLARY
Glucose-Capillary: 134 mg/dL — ABNORMAL HIGH (ref 70–99)
Glucose-Capillary: 169 mg/dL — ABNORMAL HIGH (ref 70–99)

## 2019-08-26 MED ORDER — INSULIN LISPRO (1 UNIT DIAL) 100 UNIT/ML (KWIKPEN): 2.0000 [IU] | PEN_INJECTOR | Freq: Three times a day (TID) | SUBCUTANEOUS | 0 refills | Status: DC

## 2019-08-26 MED ORDER — PANTOPRAZOLE SODIUM 40 MG PO TBEC: 40.0000 mg | DELAYED_RELEASE_TABLET | Freq: Every day | ORAL | 0 refills | Status: DC

## 2019-08-26 MED ORDER — GABAPENTIN 300 MG PO CAPS: 300.0000 mg | ORAL_CAPSULE | Freq: Every day | ORAL | 0 refills | Status: DC

## 2019-08-26 MED FILL — PANTOPRAZOLE SOD DR 40 MG T: 40 | 30 days supply | Qty: 30 | Fill #0

## 2019-08-26 MED FILL — PENTIPS 31G X 8 MM MISC: 31G X 8 MM | 30 days supply | Qty: 100 | Fill #0

## 2019-08-26 MED FILL — HUMALOG 100 UNITS/ML KWIKPE: 100 | 30 days supply | Qty: 3 | Fill #0

## 2019-08-26 MED FILL — GABAPENTIN 300 MG CAPSULE: 300 | 30 days supply | Qty: 30 | Fill #0

## 2019-08-26 NOTE — TOC Transition Note (Addendum)
Transition of Care [facility]) - CM/SW Discharge Note   Patient Details  Name: [name] [name] MRN: [id number] Date of Birth: December 06, 1951  [facility] CM/SW Contact:  Zenon Mayo, RN Phone Number: 08/26/2019, 2:20 PM   Clinical Narrative:    Patient for dc today, she spoke with Medicaid transportation on the phone and they said they will have someone to transport her to diaylsis in the morning. She will cont with [facility], PT, OT with [facility] [place] notified.  Soc will begin 24 to 48 hrs post dc.  She states she has a walker and does not need a walker.   Final next level of care: [place] Barriers to Discharge: No Barriers Identified   Patient Goals and CMS Choice Patient states their goals for this hospitalization and ongoing recovery are:: go home CMS Medicare.gov Compare Post Acute Care list provided to:: Patient Choice offered to / list presented to : Patient  Discharge Placement                       Discharge Plan and Services     Post Acute Care Choice: Home Health          DME Arranged: (NA)         HH Arranged: RN, PT, OT [facility] Agency: [place] ([facility]) Date HH Agency Contacted: 08/25/19 Time HH Agency Contacted: 0900 Representative spoke with at [place]: valerie  Social Determinants of Health ([place]) Interventions     Readmission Risk Interventions Readmission Risk Prevention Plan 08/26/2019 06/03/2019 06/02/2019  Transportation Screening Complete - Complete  Medication Review Press photographer) Complete - Complete  PCP or Specialist appointment within 3-5 days of discharge Complete Complete -  [place] or Home Care Consult Complete - -  SW Recovery Care/Counseling Consult Complete - Complete  Palliative Care Screening Not Applicable - Not [place] Not Applicable - Not Applicable  Some recent data might be hidden

## 2019-08-26 NOTE — Progress Notes (Signed)
Renal Navigator spoke with patient to confirm plan for OP HD consent signing this afternoon and OP HD first treatment tomorrow in clinic. Patient is in great spirits and thankful to be going home today. She is understanding and agreeable to plan. She is appreciative of Navigator's call and states no questions or concerns.  Alphonzo Cruise, [place] Renal Navigator [phone]

## 2019-08-26 NOTE — Progress Notes (Signed)
  Progress Note    08/26/2019 8:09 AM 1 Day Post-Op  Subjective: No overnight issues  Vitals:   08/25/19 2320 08/26/19 0539  BP: 124/78 118/76  Pulse: 73 72  Resp: 18 18  Temp: 98.2 F (36.8 C) 98.2 F (36.8 C)  SpO2: 98% 98%    Physical Exam: Awake alert oriented On the respirations Left arm incision clean dry intact Palpable thrill left upper arm Palpable left radial pulse Left hand sensorimotor intact Right chest without hematoma catheter in place  CBC    Component Value Date/Time   WBC 5.5 08/25/2019 0433   RBC 4.86 08/25/2019 0433   HGB 12.0 08/25/2019 0433   HCT 41.1 08/25/2019 0433   PLT 231 08/25/2019 0433   MCV 84.6 08/25/2019 0433   MCH 24.7 (L) 08/25/2019 0433   MCHC 29.2 (L) 08/25/2019 0433   RDW 20.8 (H) 08/25/2019 0433   LYMPHSABS 1.2 08/21/2019 0340   MONOABS 0.9 08/21/2019 0340   EOSABS 0.5 08/21/2019 0340   BASOSABS 0.0 08/21/2019 0340    BMET    Component Value Date/Time   NA 141 08/25/2019 0433   K 4.4 08/25/2019 0433   CL 105 08/25/2019 0433   CO2 24 08/25/2019 0433   GLUCOSE 108 (H) 08/25/2019 0433   BUN 62 (H) 08/25/2019 0433   CREATININE 4.58 (H) 08/25/2019 0433   CREATININE 2.62 (H) 04/29/2016 1629   CALCIUM 9.0 08/25/2019 0433   GFRNONAA 9 (L) 08/25/2019 0433   GFRAA 11 (L) 08/25/2019 0433    INR    Component Value Date/Time   INR 1.2 08/25/2019 0433     Intake/Output Summary (Last 24 hours) at 08/26/2019 0809 Last data filed at 08/26/2019 0500 Gross per 24 hour  Intake 1020 ml  Output 3025 ml  Net -2005 ml     Assessment:  68 y.o. female is s/p right tunneled dialysis catheter placement and left brachiocephalic AV fistula creation.   Plan: Okay to use tunneled catheter.  We will follow-up in 6 weeks to evaluate left arm AV fistula.  Brandon C. Donzetta Matters, MD [facility] [place] Office: [phone] Pager: [phone]  08/26/2019 8:09 AM

## 2019-08-26 NOTE — Care Management Important Message (Signed)
Important Message  Patient Details  Name: [name] [name] MRN: [id number] Date of Birth: Oct 06, 1951   Medicare Important Message Given:  Yes     Shelda Altes 08/26/2019, 12:53 PM

## 2019-08-26 NOTE — Progress Notes (Signed)
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  She was found to have acute on chronic kidney disease with an increase in creatinine from baseline of 3.57  05/2019 to 6.11 milligrams per deciliter on admission.  BUN increased to 132.  She is also noted to be acutely anemic and underwent EGD that showed esophagitis with strictures.  She follows with Dr. Joelyn Oms as an outpatient at [facility].  A decision was made that she had progressed to end-stage renal disease and dialysis was initiated with a non-tunneled dialysis catheter 08/19/2019.  She had a second dialysis treatment 08/21/2019 and the plan is for following dialysis treatment 08/23/2019.  Vein mapping has been ordered.  Recurrent fevers have resolved.  Appreciate assistance from Dr. Oneida Alar.  Status post tunneled dialysis catheter placed 08/25/2019.  Patient has dialysis slot at [facility] TTS a.m. successful dialysis treatment with removal of 2 L 08/25/2019  Blood pressure 118/76 pulse 70 temperature 98.0  Sodium 140 potassium 4 chloride 102 CO2 26 BUN 34 creatinine 3.6 glucose 155 phosphorus 4.3 calcium 8.8 albumin 2.9 WBC 9.2 platelets 251 hemoglobin 9.3    Allopurinol 100 mg daily, amlodipine 5 mg daily Lipitor 40 mg daily, Wellbutrin 150 mg twice daily, darbepoetin 100 mcg last administered 08/25/2019, gabapentin 300 mg daily, Linzess 72 mcg daily, metoprolol 12.5 mg twice daily, Protonix 40 mg daily.   Last chest x-ray 08/20/2019 showed cardiomegaly with mild interstitial edema.  CT scan of abdomen and pelvis showed no acute findings.  Aortic atherosclerosis with mild aneurysmal dilatation of the infrarenal aortic aneurysm measuring 3.1 cm small hiatal hernia and degenerative scoliosis.   Renal ultrasound showed no evidence of hydronephrosis  Objective:  Vital signs in last 24 hours:  Temp:  [97.7 F (36.5 C)-98.2 F (36.8 C)] 98.2 F (36.8 C) (08/28 0539) Pulse Rate:  [63-73] 72 (08/28 0539) Resp:  [16-24] 18 (08/28 0539) BP: (118-150)/(65-90) 118/76 (08/28 0539) SpO2:  [90 %-98 %] 98 % (08/28 0539) Weight:  [122.8 kg-271.4 kg] 271.4 kg (08/28 0428)  Weight change: -0.03 kg Filed Weights   08/25/19 1450 08/25/19 1901 08/26/19 0428  Weight: 124.8 kg 122.8 kg (!) 271.4 kg    Intake/Output: I/O last 3 completed shifts: In: 2947 [P.O.:960; I.V.:300] Out: 6546 [Urine:1100; Other:2300; Blood:25]   Intake/Output this shift:  No intake/output data recorded.  General adult female in bed in no acute distress HEENT NCAT/ EOMI Neck supple trachea midline Lungs clear to auscultation bilaterally normal work of breathing at rest ; room air  Heart RRR no rub Abdomen soft nontender nondistended Extremities trace edema  Psych normal mood and affect Neuro - alert and oriented x 3 conversant  GU - has foley  Access: RIJ nontunneled dialysis catheter    Basic Metabolic Panel: Recent Labs  Lab 08/21/19 0340 08/22/19 0443 08/23/19 0254 08/24/19 0401 08/25/19 0433 08/26/19 0835  NA 141 141 139 141 141 140  K 4.0 3.9 4.3 4.1 4.4 4.0  CL 109 106 105 105 105 102  CO2 22 24 23 26 24 26   GLUCOSE 88 115* 124* 125* 108* 155*  BUN 91* 56* 71* 54* 62* 34*  CREATININE 5.21* 3.66* 4.40* 3.98* 4.58* 3.66*  CALCIUM 8.7* 8.5* 8.6* 8.7* 9.0 8.8*  PHOS 4.2 2.7 3.7 3.8  --  4.3    Liver Function Tests: Recent Labs  Lab 08/21/19 0340 08/22/19 0443 08/23/19 0254 08/24/19 0401 08/26/19 0835  ALBUMIN 2.6* 2.6* 2.5* 2.5* 2.9*   No results for input(s): LIPASE, AMYLASE in the last 168 hours. No results for input(s): AMMONIA in the last 168 hours.  CBC: Recent Labs  Lab 08/21/19 0340 08/22/19 0443 08/23/19 0340 08/24/19 0401 08/25/19 0433 08/26/19 0835  WBC 10.6*  9.1 8.7 8.5 5.5 9.2  NEUTROABS 7.8*  --   --   --   --   --   HGB 7.6* 8.7* 8.5* 8.4* 12.0 9.3*  HCT 25.6* 29.1* 28.3* 28.9* 41.1 31.1*  MCV 82.1 83.6 83.5 85.3 84.6 84.1  PLT 315 293 287 266 231 251    Cardiac Enzymes: No results for input(s): CKTOTAL, CKMB, CKMBINDEX, TROPONINI in the last 168 hours.  BNP: Invalid input(s): POCBNP  CBG: Recent Labs  Lab 08/25/19 1045 08/25/19 1333 08/25/19 1408 08/25/19 2113 08/26/19 0612  GLUCAP 102* 132* 139* 182* 134*    Microbiology: Results for orders placed or performed during the hospital encounter of 08/16/19  SARS Coronavirus 2 [facility] order, Performed in [facility] hospital lab) Nasopharyngeal Nasopharyngeal Swab     Status: None   Collection Time: 08/16/19  2:34 PM   Specimen: Nasopharyngeal Swab  Result Value Ref Range Status   SARS Coronavirus 2 NEGATIVE NEGATIVE Final    Comment: (NOTE) If result is NEGATIVE SARS-CoV-2 target nucleic acids are NOT DETECTED. The SARS-CoV-2 RNA is generally detectable in upper and lower  respiratory specimens during the acute phase of infection. The lowest  concentration of SARS-CoV-2 viral copies this assay can detect is 250  copies / mL. A negative result does not preclude SARS-CoV-2 infection  and should not be used as the sole basis for treatment or other  patient management decisions.  A negative result may occur with  improper specimen collection / handling, submission of specimen other  than nasopharyngeal swab, presence of viral mutation(s) within the  areas targeted by this assay, and inadequate number of viral copies  (<250 copies / mL). A negative result must be combined with clinical  observations, patient history, and epidemiological information. If result is POSITIVE SARS-CoV-2 target nucleic acids are DETECTED. The SARS-CoV-2 RNA is generally detectable in upper and lower  respiratory specimens dur ing the acute phase of infection.  Positive  results are indicative of  active infection with SARS-CoV-2.  Clinical  correlation with patient history and other diagnostic information is  necessary to determine patient infection status.  Positive results do  not rule out bacterial infection or co-infection with other viruses. If result is PRESUMPTIVE POSTIVE SARS-CoV-2 nucleic acids MAY BE PRESENT.   A presumptive positive result was obtained on the submitted specimen  and confirmed on repeat testing.  While 2019 novel coronavirus  (SARS-CoV-2) nucleic acids may be present in the submitted sample  additional confirmatory testing may be necessary for epidemiological  and / or clinical management purposes  to differentiate between  SARS-CoV-2 and other Sarbecovirus currently known to infect humans.  If clinically indicated additional testing with an alternate test  methodology [phone]) is advised. The SARS-CoV-2 RNA is generally  detectable in upper and lower respiratory sp ecimens during the acute  phase of infection. The expected result is Negative. Fact Sheet for Patients:  StrictlyIdeas.no Fact Sheet for Healthcare Providers: BankingDealers.co.za This test is not yet approved or cleared by the [place] FDA and has been authorized for detection and/or  diagnosis of SARS-CoV-2 by FDA under an Emergency Use Authorization (EUA).  This EUA will remain in effect (meaning this test can be used) for the duration of the COVID-19 declaration under Section 564(b)(1) of the Act, 21 U.S.C. section 360bbb-3(b)(1), unless the authorization is terminated or revoked sooner. Performed at [facility], [place] [address], [place], [place] [address]   Culture, Urine     Status: None   Collection Time: 08/19/19  7:12 AM   Specimen: Urine, Random  Result Value Ref Range Status   Specimen Description URINE, RANDOM  Final   Special Requests NONE  Final   Culture   Final    NO GROWTH Performed at [place], [place] [address] [place], [place] [address]    Report Status 08/20/2019 FINAL  Final  Culture, blood (routine x 2)     Status: None   Collection Time: 08/19/19  7:14 AM   Specimen: BLOOD LEFT HAND  Result Value Ref Range Status   Specimen Description BLOOD LEFT HAND  Final   Special Requests   Final    BOTTLES DRAWN AEROBIC ONLY Blood Culture adequate volume   Culture   Final    NO GROWTH 5 DAYS Performed at [facility], [place] [address], [place], [place] [address]    Report Status 08/24/2019 FINAL  Final  Culture, blood (routine x 2)     Status: None   Collection Time: 08/19/19  7:14 AM   Specimen: BLOOD LEFT HAND  Result Value Ref Range Status   Specimen Description BLOOD LEFT HAND  Final   Special Requests   Final    BOTTLES DRAWN AEROBIC ONLY Blood Culture adequate volume   Culture   Final    NO GROWTH 5 DAYS Performed at [facility], [place] [address], [place], [place] [address]    Report Status 08/24/2019 FINAL  Final  Surgical pcr screen     Status: Abnormal   Collection Time: 08/24/19 10:01 PM   Specimen: Nasal Mucosa; Nasal Swab  Result Value Ref Range Status   MRSA, PCR NEGATIVE NEGATIVE Final   Staphylococcus aureus POSITIVE (A) NEGATIVE Final    Comment: (NOTE) The Xpert SA Assay (FDA approved for NASAL specimens in patients 70 years of age and older), is one component of a comprehensive surveillance program. It is not intended to diagnose infection nor to guide or monitor treatment. Performed at [facility], [place] [address], [place], [place] [address]   SARS CORONAVIRUS 2 (TAT 6-12 HRS) Nasal Swab Aptima Multi Swab     Status: None   Collection Time: 08/24/19 10:04 PM   Specimen: Aptima Multi Swab; Nasal Swab  Result Value Ref Range Status   SARS Coronavirus 2 NEGATIVE NEGATIVE Final    Comment: (NOTE) SARS-CoV-2 target nucleic acids are NOT DETECTED. The SARS-CoV-2 RNA is generally detectable in upper and lower respiratory specimens during  the acute phase of infection. Negative results do not preclude SARS-CoV-2 infection, do not rule out co-infections with other pathogens, and should not be used as the sole basis for treatment or other patient management decisions. Negative results must be combined with clinical observations, patient history, and epidemiological information. The expected result is Negative. Fact Sheet for Patients: SugarRoll.be Fact Sheet for Healthcare Providers: https://www.woods-mathews.com/ This test is not yet approved or cleared by the [place] FDA and  has been authorized for detection and/or diagnosis of SARS-CoV-2 by FDA under an Emergency Use Authorization (EUA).  This EUA will remain  in effect (meaning this test can be used) for the duration of the COVID-19 declaration under Section 56 4(b)(1) of the Act, 21 U.S.C. section 360bbb-3(b)(1), unless the authorization is terminated or revoked sooner. Performed at [facility], [place] [address] [place], [place] [address]     Coagulation Studies: Recent Labs    08/25/19 0433  LABPROT 15.2  INR 1.2    Urinalysis: No results for input(s): COLORURINE, LABSPEC, PHURINE, GLUCOSEU, HGBUR, BILIRUBINUR, KETONESUR, PROTEINUR, UROBILINOGEN, NITRITE, LEUKOCYTESUR in the last 72 hours.  Invalid input(s): APPERANCEUR    Imaging: Dg Chest Port 1 View  Result Date: 08/25/2019 CLINICAL DATA:  Postop, dialysis catheter EXAM: PORTABLE CHEST 1 VIEW COMPARISON:  08/20/2019 FINDINGS: Interval placement of a large-bore right neck multi lumen vascular catheter, tip projecting near the superior cavoatrial junction. Unchanged mild, diffuse interstitial pulmonary opacity, likely edema. No new or focal airspace opacity. Cardiomegaly status post median sternotomy and CABG. IMPRESSION: Interval placement of a large-bore right neck multi lumen vascular catheter, tip projecting near the superior cavoatrial junction.  Unchanged mild, diffuse interstitial pulmonary opacity, likely edema. No new or focal airspace opacity. Cardiomegaly status post median sternotomy and CABG. Electronically Signed   By: Eddie Candle M.D.   On: 08/25/2019 14:22   Dg Fluoro Guide Cv Line-no Report  Result Date: 08/25/2019 Fluoroscopy was utilized by the requesting physician.  No radiographic interpretation.     Medications:    . allopurinol  100 mg Oral Daily  . amLODipine  5 mg Oral Daily  . atorvastatin  40 mg Oral q1800  . brinzolamide  1 drop Both Eyes BID  . buPROPion  150 mg Oral BID  . Chlorhexidine Gluconate Cloth  6 each Topical Daily  . darbepoetin (ARANESP) injection - DIALYSIS  100 mcg Intravenous Q Thu-HD  . gabapentin  300 mg Oral Daily  . insulin aspart  0-15 Units Subcutaneous TID WC  . insulin aspart  0-5 Units Subcutaneous QHS  . insulin aspart  3 Units Subcutaneous TID WC  . latanoprost  1 drop Both Eyes QHS  . linaclotide  72 mcg Oral QAC breakfast  . metoprolol tartrate  12.5 mg Oral BID  . mupirocin ointment  1 application Nasal BID  . pantoprazole  40 mg Oral Q0600   acetaminophen **OR** acetaminophen, barrier cream, HYDROcodone-acetaminophen, ipratropium-albuterol, lidocaine (PF), mometasone-formoterol, sodium chloride flush  Assessment/ Plan:   New end-stage renal disease with progression from stage IV/V chronic kidney disease.  Initiated hemodialysis through non-tunneled dialysis catheter placed by interventional radiology 08/19/2019.  She underwent successful dialysis 08/23/2019 with removal of 2.9 l vein mapping has been ordered.  Appreciate assistance from Dr. Donzetta Matters.  Patient is stable from a renal standpoint for discharge may dialyze at [facility] a.m. shift  Anemia status post GI blood loss continues on Feraheme and darbepoetin.  She also received packed red blood cells total of 3 units 08/17/2019 08/18/2019 and 08/21/2019.  Urinary retention continues to bladder scan  Fever work-up per  primary team blood cultures NGTD urine culture NGTD  Hypertension controlled  Metabolic acidosis transition to hemodialysis  Diabetes mellitus per primary team  History of falls at home no fractures.  Urine drug screen positive for cocaine  Aortic atherosclerosis with a 3.1 cm aneurysm follow-up ultrasound recommended for 3 years.   LOS: [place] @TODAY @9 :54 AM

## 2019-08-26 NOTE — Progress Notes (Signed)
Pt removed iv, dressing applied by nurse. Pt also removed telemetry leads.  Pt is currently sitting in chair watching television.

## 2019-08-26 NOTE — Progress Notes (Signed)
Went in pt's. Room to give morning meds. This Rn saw pt. Pulling the R chest HD Cath asking to pull it out. Noted that HD Cath has not covered with any dressing. Re educated the pt. About the risk of bleeding and  the cath needs to stay until the left arm fistula is ready to be used in a couple of weeks. Pt. Replied "ok, ok". Primary RN was called in the room and she stated that has a dressing was inplace when she assessed the pt during changed of shift hand off. Pt. Denied removing the dressing. Dressing placed.. IV Team paged.

## 2019-08-26 NOTE — Progress Notes (Signed)
Discharge and medication education given with teach back.  Questions and concerns answered.  Transportation for dialysis set up by CM.  Spoke with pt's sister Aletta Edouard) to let her know that pt was ready and waiting for her to be picked up. Pt was transported by nurse tech to valet entrance where sister is to pick her up. Medications brought by TOC.

## 2019-08-26 NOTE — Progress Notes (Signed)
Called Aletta Edouard (pt's sister) with pt permission, to ask if she will be taking her to her HD appointments. Pt's sister stated that she will not be able to take her to the HD appointments.    Spoke with CM Neoma Laming to make sure pt has transportation to HD. She will give me a call when transportation is set up.

## 2019-08-26 NOTE — Progress Notes (Signed)
Subjective: Patient evaluated at bedside this morning. Not in acute distress. Reports that she is doing great and wants to go home. Patient is happy with the tunneled catheter and HD placement and understands plan to use tunneled catheter until fistula matures. Patient instructed on stopping lantus and using mealtime insulin only. She states understanding that she is to measure her glucose at each meal.   Objective:  Vital signs in last 24 hours: Vitals:   08/25/19 1901 08/25/19 2320 08/26/19 0428 08/26/19 0539  BP: (!) 150/76 124/78  118/76  Pulse: 72 73  72  Resp: 18 18  18   Temp: 98.2 F (36.8 C) 98.2 F (36.8 C)  98.2 F (36.8 C)  TempSrc: Oral Oral  Oral  SpO2: 98% 98%  98%  Weight: 122.8 kg  (!) 271.4 kg   Height:       Physical Exam Constitutional:      Appearance: She is well-developed. She is obese.  HENT:     Head: Normocephalic and atraumatic.  Cardiovascular:     Rate and Rhythm: Normal rate and regular rhythm.     Heart sounds: No murmur. No friction rub. No gallop.   Pulmonary:     Breath sounds: Normal breath sounds. No wheezing, rhonchi or rales.  Abdominal:     General: There is no distension.     Palpations: Abdomen is soft. There is no mass.     Tenderness: There is no abdominal tenderness.  Musculoskeletal:        General: No swelling or tenderness.  Skin:    General: Skin is warm and dry.  Neurological:     Mental Status: She is alert.  Psychiatric:        Mood and Affect: Mood normal.        Behavior: Behavior normal.      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 EGD on 8/20 negative for acute upper GI bleeding.Non-tunneledHDcatheter placed with IR and first HD sessionson 8/21.She is s/p3U pRBC since admission with appropriate response in Hgb. Tunneled HD catheter and L arm AV fistula placed yesterday. Plan for  discharge today.  Acute blood loss anemia on chronic anemia: Known history of colonic AVMs with baseline Hb 9-11,Hb 7.3>>7.0on admit.EGDnegative for source.Colonoscopy deferred unless overt bleeding.Iron studies 8/19: Fe 19, Ferritin 14,TIBC 307, Saturation ratio 6%, consistent with IDA. S/PIV Fe (8/19) and ESA(8/20), 3u pRBCtotal. Hb 12.0 yesterday AM.  - Outpatient GI followup  CKDVProgression to ESRD: Prior baselineCr3.3-3.4 BUN/Cr 132/6.1 on admission.FENa 1.8% suggests intrinsic etiology.Temporary HD cath placed8/21.StartedHD on 8/21 has tolerated well thus far.Patient for AV fistula and tunneled catheter placement with Dr. Donzetta Matters yesterday and first HD on Saturday. Patient placed for OP HD at [facility] on TTS morning schedule. Patient will start in clinic on Saturday 8/29 if discharged today. Will need to go to dialysis center today 2:30-5:00pm to complete intake paperwork tomorrow to start on Saturday. Patient's sister will take to appointment today  * Discharge today if patient can get transportation to HD tomorrow   [facility] unknown etiology: Fever of 101-102on 8/20 and 8/21w/ mild leukokcytosis. Afebrile since,WBC now WNL since 8/24. U/A w/bacteruria but no WBC; CXR w/o PNA;Urine and blood cultures negative. No antibiotics given.   Mechanical falls w/o syncope: Two episodes of mechanical falls(tripped on bathroom rug) prior to admission. No trauma nor LOC. No syncopal or presyncopal  symptoms preceding fall; did report brieflightheadedness immediately upon standing.ng to walk. Reportedlanding on knee and back. Imaging negative. Somebruising on left hip noted. - PT/OTrecommend HH PT and rolling walker. Orders placed for discharge  IDDM:Home Regimen:Lantus 21U, Novolog 3UTID.Last HbA1c 6.6 (03/2019). - Patient reports she was only taking her Lantus 21 U at home prior to admission (not taking mealtime insulin). 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 F/U U/Sin 3 years.  Diet:Renal diet Fluids:None Code: Full code DVT Prophylaxis: SCDs  Dispo: Anticipated discharge today.   Jeanmarie Hubert, MD  Internal Medicine PGY1 08/26/2019, 8:26 AM Pager: [phone]

## 2019-08-26 NOTE — Progress Notes (Signed)
Completing rounds on floor and patient discussed happiness in being able to go home today.  Let patient know to follow-up with a chaplain as needed with nurse.

## 2019-09-07 ENCOUNTER — Ambulatory Visit: Payer: Medicare Other | Admitting: Physician Assistant

## 2019-09-27 ENCOUNTER — Other Ambulatory Visit: Payer: Self-pay

## 2019-09-27 DIAGNOSIS — Z992 Dependence on renal dialysis: Secondary | ICD-10-CM

## 2019-09-27 DIAGNOSIS — N186 End stage renal disease: Secondary | ICD-10-CM

## 2019-09-30 ENCOUNTER — Encounter (HOSPITAL_COMMUNITY): Payer: Medicare Other

## 2019-10-04 ENCOUNTER — Emergency Department (HOSPITAL_COMMUNITY): Payer: Medicare Other

## 2019-10-04 ENCOUNTER — Emergency Department (HOSPITAL_COMMUNITY)
Admission: EM | Admit: 2019-10-04 | Discharge: 2019-10-04 | Disposition: A | Discharge status: Home / Self Care | Payer: Medicare Other | Source: Home / Self Care | Attending: Emergency Medicine | Admitting: Emergency Medicine

## 2019-10-04 DIAGNOSIS — F17210 Nicotine dependence, cigarettes, uncomplicated: Secondary | ICD-10-CM | POA: Insufficient documentation

## 2019-10-04 DIAGNOSIS — I132 Hypertensive heart and chronic kidney disease with heart failure and with stage 5 chronic kidney disease, or end stage renal disease: Secondary | ICD-10-CM | POA: Insufficient documentation

## 2019-10-04 DIAGNOSIS — Z951 Presence of aortocoronary bypass graft: Secondary | ICD-10-CM | POA: Insufficient documentation

## 2019-10-04 DIAGNOSIS — W109XXA Fall (on) (from) unspecified stairs and steps, initial encounter: Secondary | ICD-10-CM | POA: Insufficient documentation

## 2019-10-04 DIAGNOSIS — Y999 Unspecified external cause status: Secondary | ICD-10-CM | POA: Insufficient documentation

## 2019-10-04 DIAGNOSIS — Z794 Long term (current) use of insulin: Secondary | ICD-10-CM | POA: Insufficient documentation

## 2019-10-04 DIAGNOSIS — Y9301 Activity, walking, marching and hiking: Secondary | ICD-10-CM | POA: Insufficient documentation

## 2019-10-04 DIAGNOSIS — Z992 Dependence on renal dialysis: Secondary | ICD-10-CM | POA: Insufficient documentation

## 2019-10-04 DIAGNOSIS — Y929 Unspecified place or not applicable: Secondary | ICD-10-CM | POA: Insufficient documentation

## 2019-10-04 DIAGNOSIS — Z79899 Other long term (current) drug therapy: Secondary | ICD-10-CM | POA: Insufficient documentation

## 2019-10-04 DIAGNOSIS — K921 Melena: Secondary | ICD-10-CM | POA: Diagnosis not present

## 2019-10-04 DIAGNOSIS — D649 Anemia, unspecified: Secondary | ICD-10-CM | POA: Insufficient documentation

## 2019-10-04 DIAGNOSIS — N186 End stage renal disease: Secondary | ICD-10-CM | POA: Insufficient documentation

## 2019-10-04 DIAGNOSIS — E1122 Type 2 diabetes mellitus with diabetic chronic kidney disease: Secondary | ICD-10-CM | POA: Insufficient documentation

## 2019-10-04 DIAGNOSIS — M545 Low back pain: Secondary | ICD-10-CM | POA: Insufficient documentation

## 2019-10-04 DIAGNOSIS — W19XXXA Unspecified fall, initial encounter: Secondary | ICD-10-CM

## 2019-10-04 DIAGNOSIS — I252 Old myocardial infarction: Secondary | ICD-10-CM | POA: Insufficient documentation

## 2019-10-04 DIAGNOSIS — Z7982 Long term (current) use of aspirin: Secondary | ICD-10-CM | POA: Insufficient documentation

## 2019-10-04 DIAGNOSIS — I5042 Chronic combined systolic (congestive) and diastolic (congestive) heart failure: Secondary | ICD-10-CM | POA: Insufficient documentation

## 2019-10-04 DIAGNOSIS — I2510 Atherosclerotic heart disease of native coronary artery without angina pectoris: Secondary | ICD-10-CM | POA: Insufficient documentation

## 2019-10-04 LAB — BASIC METABOLIC PANEL
Anion gap: 17 — ABNORMAL HIGH (ref 5–15)
BUN: 51 mg/dL — ABNORMAL HIGH (ref 8–23)
CO2: 26 mmol/L (ref 22–32)
Calcium: 8.3 mg/dL — ABNORMAL LOW (ref 8.9–10.3)
Chloride: 97 mmol/L — ABNORMAL LOW (ref 98–111)
Creatinine, Ser: 8.87 mg/dL — ABNORMAL HIGH (ref 0.44–1.00)
GFR calc Af Amer: 5 mL/min — ABNORMAL LOW (ref >60)
GFR calc non Af Amer: 4 mL/min — ABNORMAL LOW (ref >60)
Glucose, Bld: 137 mg/dL — ABNORMAL HIGH (ref 70–99)
Potassium: 4.7 mmol/L (ref 3.5–5.1)
Sodium: 140 mmol/L (ref 135–145)

## 2019-10-04 LAB — CBC
HCT: 26.7 % — ABNORMAL LOW (ref 36.0–46.0)
Hemoglobin: 7.8 g/dL — ABNORMAL LOW (ref 12.0–15.0)
MCH: 27.6 pg (ref 26.0–34.0)
MCHC: 29.2 g/dL — ABNORMAL LOW (ref 30.0–36.0)
MCV: 94.3 fL (ref 80.0–100.0)
Platelets: 355 10*3/uL (ref 150–400)
RBC: 2.83 MIL/uL — ABNORMAL LOW (ref 3.87–5.11)
RDW: 19.8 % — ABNORMAL HIGH (ref 11.5–15.5)
WBC: 10.9 10*3/uL — ABNORMAL HIGH (ref 4.0–10.5)
nRBC: 0.4 % — ABNORMAL HIGH (ref 0.0–0.2)

## 2019-10-04 LAB — CBG MONITORING, ED: Glucose-Capillary: 139 mg/dL — ABNORMAL HIGH (ref 70–99)

## 2019-10-04 MED ORDER — SODIUM CHLORIDE 0.9% FLUSH: 3.0000 mL | Freq: Once | INTRAVENOUS | Status: DC

## 2019-10-04 MED ORDER — ACETAMINOPHEN 325 MG PO TABS
650.0000 mg | ORAL_TABLET | Freq: Once | ORAL | Status: AC
  Administered 2019-10-04: 09:00:00 650 mg via ORAL
  Filled 2019-10-04: qty 2

## 2019-10-04 NOTE — ED Provider Notes (Signed)
[facility] EMERGENCY DEPARTMENT Provider Note   CSN: [id number] Arrival date & time: 10/04/19  0753     History   Chief Complaint Chief Complaint  Patient presents with  . Fall    HPI [name] [name] is a 68 y.o. female.     The history is provided by the patient and medical records. No language interpreter was used.  Fall     68 year old morbidly obese female with history of end-stage renal disease currently on Tuesday Thursday Saturday dialysis brought here via EMS for evaluation of fall.  Patient reports she was walking down the steps on the way to the bus to be taken to her dialysis center this morning for dialysis.  She mention leg gave out, causing her to fall backward and struck her buttock against the ground.  She denies hitting her head or loss of consciousness.  She mention using a cane to walking her legs has never been steady.  She is currently complaining of sharp throbbing pain to her buttock region, mild to moderate in severity.  No headache, no neck pain, no knee pain or leg pain.  She does not think she has broken any bones.  Furthermore, she denies any chest pain or trouble breathing.  She denies any precipitating symptoms prior to the fall.  Past Medical History:  Diagnosis Date  . Abscess   . Acute blood loss anemia 08/17/2019  . Acute respiratory failure ([place]) 10/18/2014  . Acute respiratory failure with hypoxia and hypercapnia ([place]) 06/01/2019  . Anemia 08/2016  . Angiodysplasia of colon   . Arthritis of left shoulder region 03/23/2013  . Bleeding gastrointestinal   . Cardiomegaly 05/2019  . Chest pain 04/17/2016  . Chronic combined systolic and diastolic CHF (congestive heart failure) (HCC)    a. EF 40-45%, mild LVH, mid apicalanteroseptal and apical HK.  . CKD (chronic kidney disease), stage III ([place])   . Cocaine abuse ([place])    crack cocaine heavily until 2008 then sporadic use since then  . Coronary artery disease    a. 06/2012  NSTEMI/CABG x 3 (LIMA->LAD, VG->OM2, VG->LCX);  b. 04/2015 MV: EF<30%, mid ant, apicalanterior, apical infarct;  c. 04/2015 Cath: LM nl, LAD 90p, LCX 31m, OM1 min irregs, RCA mild dzs, LIMA->LAD nl w/ dist LAD dzs, VG->OM2 nl, VG->LCX nl-->Med Rx.  . CVA (cerebral infarction)    a. right internal capsule stroke in 12/2006  . Demand ischemia ([place])   . Diabetes mellitus    diagnosed in 2008  . Elevated troponin 04/27/2019  . Essential hypertension   . Glaucoma   . Gout   . Heme positive stool   . HFrEF (heart failure with reduced ejection fraction) ([place])   . Hyperlipidemia   . Hyperparathyroidism, secondary renal ([place])   . Hypertensive crisis 06/02/2019  . Left-sided sensory deficit present   . Lobar pneumonia ([place]) 04/27/2019  . Obesity, morbid ([place])   . Pneumonia   . Pulmonary edema 05/2019  . PVD (peripheral vascular disease) ([place])    a. 06/2012 ABI's: R - 0.73, L - 0.71.  Marland Kitchen Renal mass, right   . Sepsis ([place]) 04/27/2019  . Shortness of breath dyspnea   . Stroke ([place])   . Thrombocytosis ([place]) 04/17/2016  . Thyroid nodule    FNA in 6010 showed follicular cells but not definate neoplasm  . Tobacco abuse   . Trichomoniasis     Patient Active Problem List   Diagnosis Date Noted  . Esophageal stricture   .  Diabetic gastroparesis ([place])   . GI bleed 08/16/2019  . Hiatal hernia   . Iron deficiency anemia   . Microcytic anemia 02/05/2016  . Acute on chronic renal failure ([place])   . CAD in native artery   . Substance abuse ([place])   . Chronic combined systolic and diastolic CHF (congestive heart failure) ([place])   . ESRD on dialysis ([place])   . Obesity, Class III, BMI 40-49.9 (morbid obesity) ([place])   . Insulin dependent type 2 diabetes mellitus, controlled ([place]) 10/18/2014  . S/P CABG (coronary artery bypass graft) 09/21/2013  . Arthritis of right shoulder region 03/23/2013  . Tobacco abuse   . Coronary artery disease   . NSTEMI (non-ST elevated myocardial infarction) ([place]) 06/29/2012  .  History of cocaine abuse ([place]) 06/29/2012  . Essential hypertension 06/29/2012  . Glaucoma   . Hyperlipidemia   . History of CVA (cerebrovascular accident)     Past Surgical History:  Procedure Laterality Date  . AV FISTULA PLACEMENT Left 08/25/2019   Procedure: ARTERIOVENOUS (AV) BRACHIOCEPHALIC FISTULA CREATION;  Surgeon: Waynetta Sandy, MD;  Location: [facility];  Service: Vascular;  Laterality: Left;  . CARDIAC CATHETERIZATION    . CARDIAC CATHETERIZATION N/A 05/17/2015   Procedure: Left Heart Cath and Cors/Grafts Angiography;  Surgeon: [name], MD;  Location: [facility] CV LAB;  Service: Cardiovascular;  Laterality: N/A;  . COLONOSCOPY WITH PROPOFOL N/A 04/21/2016   Procedure: COLONOSCOPY WITH PROPOFOL;  Surgeon: Irene Shipper, MD;  Location: [facility];  Service: Endoscopy;  Laterality: N/A;  . CORONARY ARTERY BYPASS GRAFT  07/09/2012   Procedure: CORONARY ARTERY BYPASS GRAFTING (CABG);  Surgeon: Ivin Poot, MD;  Location: [facility];  Service: Open Heart Surgery;  Laterality: N/A;  . ESOPHAGOGASTRODUODENOSCOPY N/A 04/20/2016   Procedure: ESOPHAGOGASTRODUODENOSCOPY (EGD);  Surgeon: Gatha Mayer, MD;  Location: [facility] ENDOSCOPY;  Service: Endoscopy;  Laterality: N/A;  . ESOPHAGOGASTRODUODENOSCOPY (EGD) WITH PROPOFOL N/A 08/17/2019   Procedure: ESOPHAGOGASTRODUODENOSCOPY (EGD) WITH PROPOFOL;  Surgeon: Irene Shipper, MD;  Location: [facility] ENDOSCOPY;  Service: Endoscopy;  Laterality: N/A;  . INSERTION OF DIALYSIS CATHETER Right 08/25/2019   Procedure: INSERTION OF DIALYSIS CATHETER;  Surgeon: Waynetta Sandy, MD;  Location: [facility];  Service: Vascular;  Laterality: Right;  . IR FLUORO GUIDE CV LINE RIGHT  08/19/2019  . IR US GUIDE VASC ACCESS RIGHT  08/19/2019  . LEFT HEART CATHETERIZATION WITH CORONARY ANGIOGRAM N/A 06/29/2012   Procedure: LEFT HEART CATHETERIZATION WITH CORONARY ANGIOGRAM;  Surgeon: Peter M Martinique, MD;  Location: [facility] CATH LAB;  Service: Cardiovascular;   Laterality: N/A;  . STERNAL WOUND DEBRIDEMENT  08/17/2012   Procedure: STERNAL WOUND DEBRIDEMENT;  Surgeon: Ivin Poot, MD;  Location: [facility] OR;  Service: Thoracic;  Laterality: N/A;  wound vac application  . STERNAL WOUND DEBRIDEMENT  08/24/2012   Procedure: STERNAL WOUND DEBRIDEMENT;  Surgeon: Ivin Poot, MD;  Location: [facility];  Service: Thoracic;  Laterality: N/A;  . STERNAL WOUND DEBRIDEMENT  09/01/2012   Procedure: STERNAL WOUND DEBRIDEMENT;  Surgeon: Ivin Poot, MD;  Location: [facility];  Service: Thoracic;  Laterality: N/A;  . STERNAL WOUND DEBRIDEMENT  09/20/2012   Procedure: STERNAL WOUND DEBRIDEMENT;  Surgeon: Ivin Poot, MD;  Location: [facility] OR;  Service: Thoracic;  Laterality: N/A;  wound vac change     OB History   No obstetric history on file.      Home Medications    Prior to Admission medications   Medication Sig Start Date  End Date Taking? Authorizing Provider  allopurinol (ZYLOPRIM) 100 MG tablet Take 100 mg by mouth 2 (two) times daily.  11/25/17   [provider]  amLODipine (NORVASC) 5 MG tablet Take 1 tablet (5 mg total) by mouth daily. 04/30/19 04/29/20  [name], MD  aspirin EC 325 MG tablet Take 325 mg by mouth daily. 05/13/19   [provider]  atorvastatin (LIPITOR) 40 MG tablet Take 1 tablet (40 mg total) by mouth daily at 6 PM. 06/03/19   Hongalgi, Lenis Dickinson, MD  brinzolamide (AZOPT) 1 % ophthalmic suspension Place 1 drop into both eyes 2 (two) times a day.     [provider]  buPROPion (WELLBUTRIN SR) 150 MG 12 hr tablet Take 1 tablet (150 mg total) by mouth 2 (two) times daily. 01/16/16   Mikhail, Velta Addison, DO  cetirizine (ZYRTEC) 10 MG tablet Take 10 mg by mouth daily.     [provider]  ferrous sulfate 325 (65 FE) MG tablet Take 325 mg by mouth 3 (three) times daily with meals.    [provider]  gabapentin (NEURONTIN) 300 MG capsule Take 1 capsule (300 mg total) by mouth daily. 08/26/19 09/25/19  Jeanmarie Hubert,  MD  insulin lispro (HUMALOG KWIKPEN) 100 UNIT/ML KwikPen Inject 0.02 mLs (2 Units total) into the skin 3 (three) times daily with meals. 08/26/19   Jeanmarie Hubert, MD  ipratropium (ATROVENT HFA) 17 MCG/ACT inhaler Inhale 2 puffs into the lungs every 6 (six) hours as needed for wheezing.    [provider]  latanoprost (XALATAN) 0.005 % ophthalmic solution Place 1 drop into both eyes at bedtime.    [provider]  LINZESS 72 MCG capsule Take 72 mcg by mouth daily before breakfast.  11/21/17   [provider]  metoprolol tartrate (LOPRESSOR) 25 MG tablet Take 25 mg by mouth 2 (two) times daily.    [provider]  mometasone-formoterol (DULERA) 100-5 MCG/ACT AERO Inhale 2 puffs into the lungs every 4 (four) hours as needed for wheezing.    [provider]  nitroGLYCERIN (NITROSTAT) 0.4 MG SL tablet Place 1 tablet (0.4 mg total) under the tongue every 5 (five) minutes as needed for chest pain. 04/30/19   [name], MD  pantoprazole (PROTONIX) 40 MG tablet Take 1 tablet (40 mg total) by mouth daily. 08/26/19   Jeanmarie Hubert, MD  polyethylene glycol (MIRALAX / GLYCOLAX) 17 g packet Take 17 g by mouth daily as needed for mild constipation.     [provider]  PROAIR HFA 108 [phone] Base) MCG/ACT inhaler Inhale 2 puffs into the lungs every 6 (six) hours as needed for wheezing.  11/17/17   [provider]  Grant Ruts INHUB 250-50 MCG/DOSE AEPB Inhale 2 puffs into the lungs daily.  05/04/19   [provider]    Family History Family History  Problem Relation Age of Onset  . Diabetes Mother   . Hypertension Mother   . Cancer Mother   . Hyperlipidemia Father   . Hypertension Father   . Kidney disease Father   . Gout Father   . Cerebrovascular Accident Father   . Other Other        no known family CAD    Social History Social History   Tobacco Use  . Smoking status: Current Some Day Smoker    Packs/day: 0.25    Years: 50.00     Pack years: 12.50    Types: Cigarettes  . Smokeless tobacco: Never Used  Substance Use Topics  . Alcohol use: No    Alcohol/week: 0.0 standard drinks  . Drug use: Yes    Types: Cocaine    Comment: pt denies at current time     Allergies   Naproxen   Review of Systems Review of Systems  All other systems reviewed and are negative.    Physical Exam Updated Vital Signs BP (!) 113/54 (BP Location: Right Arm)   Pulse 75   Temp 98.3 F (36.8 C) (Oral)   Resp 16   SpO2 99%   Physical Exam Vitals signs and nursing note reviewed.  Constitutional:      General: She is not in acute distress.    Appearance: She is well-developed. She is obese.  HENT:     Head: Atraumatic.  Eyes:     Conjunctiva/sclera: Conjunctivae normal.  Neck:     Musculoskeletal: Neck supple.  Cardiovascular:     Rate and Rhythm: Normal rate and regular rhythm.  Pulmonary:     Breath sounds: Normal breath sounds. No rales.  Abdominal:     Palpations: Abdomen is soft.  Musculoskeletal:        General: Tenderness (Mild tenderness to the lumbar and lumbosacral region on palpation) present.  Skin:    Findings: No rash.  Neurological:     Mental Status: She is alert and oriented to person, place, and time.  Psychiatric:        Mood and Affect: Mood normal.      ED Treatments / Results  Labs (all labs ordered are listed, but only abnormal results are displayed) Labs Reviewed  BASIC METABOLIC PANEL - Abnormal; Notable for the following components:      Result Value   Chloride 97 (*)    Glucose, Bld 137 (*)    BUN 51 (*)    Creatinine, Ser 8.87 (*)    Calcium 8.3 (*)    GFR calc non Af Amer 4 (*)    GFR calc Af Amer 5 (*)    Anion gap 17 (*)    All other components within normal limits  CBC - Abnormal; Notable for the following components:   WBC 10.9 (*)    RBC 2.83 (*)    Hemoglobin 7.8 (*)    HCT 26.7 (*)    MCHC 29.2 (*)    RDW 19.8 (*)    nRBC 0.4 (*)    All other components  within normal limits  URINALYSIS, ROUTINE W REFLEX MICROSCOPIC  CBG MONITORING, ED    EKG None  Radiology Dg Lumbar Spine Complete  Result Date: 10/04/2019 CLINICAL DATA:  Low back pain after a fall this morning. EXAM: LUMBAR SPINE - COMPLETE 4+ VIEW COMPARISON:  None. FINDINGS: Vertebral body height and alignment are maintained. There is loss of disc space height and facet degenerative disease at L4-5 and L5-S1. Paraspinous structures demonstrate extensive atherosclerotic vascular disease. The descending abdominal aorta measures 3.7 cm. IMPRESSION: No acute abnormality. Lower lumbar degenerative disease. Atherosclerosis with a 3.7 cm abdominal aortic aneurysm. Recommend followup by ultrasound in 2 years. This recommendation follows ACR consensus guidelines: White Paper of the ACR Incidental Findings Committee II on Vascular Findings. J Am Coll Radiol 2013; 10:789-794. Electronically Signed   By: Inge Rise M.D.   On: 10/04/2019 09:52   Dg Chest Portable 1 View  Result Date: 10/04/2019 CLINICAL DATA:  Shortness of breath today with fall and low back pain. EXAM: PORTABLE CHEST 1 VIEW COMPARISON:  08/25/2019 FINDINGS: Right IJ dialysis  catheter unchanged. Lungs are adequately inflated without lobar consolidation or effusion. Stable cardiomegaly. Remainder of the exam is unchanged. IMPRESSION: No acute cardiopulmonary disease. Stable cardiomegaly. Electronically Signed   By: Marin Olp M.D.   On: 10/04/2019 10:08    Procedures Procedures (including critical care time)  Medications Ordered in ED Medications  sodium chloride flush (NS) 0.9 % injection 3 mL (has no administration in time range)  acetaminophen (TYLENOL) tablet 650 mg (650 mg Oral Given 10/04/19 0908)     Initial Impression / Assessment and Plan / ED Course  I have reviewed the triage vital signs and the nursing notes.  Pertinent labs & imaging results that were available during my care of the patient were reviewed by  me and considered in my medical decision making (see chart for details).        BP (!) 118/43   Pulse 69   Temp 98.3 F (36.8 C) (Oral)   Resp (!) 25   SpO2 94%    Final Clinical Impressions(s) / ED Diagnoses   Final diagnoses:  None    ED Discharge Orders    None     9:07 AM Obese female fell backward today while walking towards the [employer] to go to her dialysis center.  She does have mild tenderness to her lumbar and paralumbar spinal muscle in her sacral region.  Will obtain x-ray.  Tylenol given for pain.  She is due for dialysis today.  She does not have any shortness of breath concerning for fluid overload.  10:41 AM Lumbar spine x-ray without any acute abnormalities.  Evidence of a 3.7 cm abdominal aortic aneurysm were noted.  This finding was discussed with patient with recommendation for follow-up ultrasound in 2 years.  Screening chest x-ray obtained showing no evidence of acute cardiopulmonary disease.  No evidence of pleural effusion.  Her potassium is within normal limit.  Her current hemoglobin is 7.8.  Her baseline is roughly 8.3.  Anemia is likely secondary to chronic kidney disease.  Currently awaits for orthostatic vital sign.  11:22 AM Normal orthostatic vital sign.  Patient requesting for something to eat, food given.  I encourage patient to call her dialysis center to have dialysis perform either today or tomorrow.  Patient may take Tylenol as needed for pain.  Care discussed with Dr. Eulis Foster.    Domenic Moras, PA-C 10/04/19 1130    Daleen Bo, MD 10/04/19 2015

## 2019-10-04 NOTE — ED Notes (Signed)
Patient transported to X-ray 

## 2019-10-04 NOTE — ED Triage Notes (Addendum)
Pt was on her way to dialysis this morning when she fell in the parking lot on her way to the [place]. Pt denies hitting her head or any LoC. Pt has mid back pain and gcems applied c collar. Pt reports last treatment was on Saturday- due today for dialysis

## 2019-10-04 NOTE — ED Notes (Signed)
ED Provider,  at bedside.

## 2019-10-04 NOTE — Discharge Instructions (Signed)
You have been evaluated for your fall.  Fortunately no evidence of broken bones.  Take OTC tylenol as needed for pain.  Your hemoglobin is 7.8 today, discuss this with your dialysis specialist at your earliest convenient and make sure to go to your dialysis session today or tomorrow.  Return if you have any concerns.

## 2019-10-05 ENCOUNTER — Inpatient Hospital Stay (HOSPITAL_COMMUNITY)
Admission: EM | Admit: 2019-10-05 | Discharge: 2019-10-11 | DRG: 377 | Disposition: A | Discharge status: Skilled Nursing Facility | Payer: Medicare Other | Attending: Internal Medicine | Admitting: Internal Medicine

## 2019-10-05 ENCOUNTER — Encounter (HOSPITAL_COMMUNITY): Payer: Self-pay | Admitting: Emergency Medicine

## 2019-10-05 DIAGNOSIS — I5042 Chronic combined systolic (congestive) and diastolic (congestive) heart failure: Secondary | ICD-10-CM | POA: Diagnosis present

## 2019-10-05 DIAGNOSIS — Z79899 Other long term (current) drug therapy: Secondary | ICD-10-CM | POA: Diagnosis not present

## 2019-10-05 DIAGNOSIS — Z951 Presence of aortocoronary bypass graft: Secondary | ICD-10-CM

## 2019-10-05 DIAGNOSIS — E8889 Other specified metabolic disorders: Secondary | ICD-10-CM | POA: Diagnosis present

## 2019-10-05 DIAGNOSIS — Z20828 Contact with and (suspected) exposure to other viral communicable diseases: Secondary | ICD-10-CM | POA: Diagnosis present

## 2019-10-05 DIAGNOSIS — I2510 Atherosclerotic heart disease of native coronary artery without angina pectoris: Secondary | ICD-10-CM | POA: Diagnosis present

## 2019-10-05 DIAGNOSIS — I10 Essential (primary) hypertension: Secondary | ICD-10-CM | POA: Diagnosis not present

## 2019-10-05 DIAGNOSIS — Z8673 Personal history of transient ischemic attack (TIA), and cerebral infarction without residual deficits: Secondary | ICD-10-CM | POA: Diagnosis not present

## 2019-10-05 DIAGNOSIS — K3184 Gastroparesis: Secondary | ICD-10-CM | POA: Diagnosis present

## 2019-10-05 DIAGNOSIS — N2581 Secondary hyperparathyroidism of renal origin: Secondary | ICD-10-CM | POA: Diagnosis present

## 2019-10-05 DIAGNOSIS — I25810 Atherosclerosis of coronary artery bypass graft(s) without angina pectoris: Secondary | ICD-10-CM | POA: Diagnosis not present

## 2019-10-05 DIAGNOSIS — Z823 Family history of stroke: Secondary | ICD-10-CM | POA: Diagnosis not present

## 2019-10-05 DIAGNOSIS — D500 Iron deficiency anemia secondary to blood loss (chronic): Secondary | ICD-10-CM | POA: Diagnosis not present

## 2019-10-05 DIAGNOSIS — K922 Gastrointestinal hemorrhage, unspecified: Secondary | ICD-10-CM | POA: Diagnosis present

## 2019-10-05 DIAGNOSIS — D509 Iron deficiency anemia, unspecified: Secondary | ICD-10-CM | POA: Diagnosis present

## 2019-10-05 DIAGNOSIS — R531 Weakness: Secondary | ICD-10-CM

## 2019-10-05 DIAGNOSIS — Z7951 Long term (current) use of inhaled steroids: Secondary | ICD-10-CM | POA: Diagnosis not present

## 2019-10-05 DIAGNOSIS — E1122 Type 2 diabetes mellitus with diabetic chronic kidney disease: Secondary | ICD-10-CM

## 2019-10-05 DIAGNOSIS — N186 End stage renal disease: Secondary | ICD-10-CM

## 2019-10-05 DIAGNOSIS — I4891 Unspecified atrial fibrillation: Secondary | ICD-10-CM | POA: Diagnosis present

## 2019-10-05 DIAGNOSIS — I4892 Unspecified atrial flutter: Secondary | ICD-10-CM | POA: Diagnosis present

## 2019-10-05 DIAGNOSIS — Z7982 Long term (current) use of aspirin: Secondary | ICD-10-CM

## 2019-10-05 DIAGNOSIS — Z66 Do not resuscitate: Secondary | ICD-10-CM | POA: Diagnosis present

## 2019-10-05 DIAGNOSIS — K449 Diaphragmatic hernia without obstruction or gangrene: Secondary | ICD-10-CM | POA: Diagnosis not present

## 2019-10-05 DIAGNOSIS — E1142 Type 2 diabetes mellitus with diabetic polyneuropathy: Secondary | ICD-10-CM | POA: Diagnosis present

## 2019-10-05 DIAGNOSIS — D62 Acute posthemorrhagic anemia: Secondary | ICD-10-CM | POA: Diagnosis present

## 2019-10-05 DIAGNOSIS — K921 Melena: Principal | ICD-10-CM | POA: Diagnosis present

## 2019-10-05 DIAGNOSIS — E1151 Type 2 diabetes mellitus with diabetic peripheral angiopathy without gangrene: Secondary | ICD-10-CM | POA: Diagnosis present

## 2019-10-05 DIAGNOSIS — Z992 Dependence on renal dialysis: Secondary | ICD-10-CM

## 2019-10-05 DIAGNOSIS — F17210 Nicotine dependence, cigarettes, uncomplicated: Secondary | ICD-10-CM | POA: Diagnosis present

## 2019-10-05 DIAGNOSIS — D631 Anemia in chronic kidney disease: Secondary | ICD-10-CM | POA: Diagnosis present

## 2019-10-05 DIAGNOSIS — Z8249 Family history of ischemic heart disease and other diseases of the circulatory system: Secondary | ICD-10-CM

## 2019-10-05 DIAGNOSIS — I132 Hypertensive heart and chronic kidney disease with heart failure and with stage 5 chronic kidney disease, or end stage renal disease: Secondary | ICD-10-CM | POA: Diagnosis present

## 2019-10-05 DIAGNOSIS — Z794 Long term (current) use of insulin: Secondary | ICD-10-CM

## 2019-10-05 DIAGNOSIS — R296 Repeated falls: Secondary | ICD-10-CM

## 2019-10-05 DIAGNOSIS — Z6841 Body Mass Index (BMI) 40.0 and over, adult: Secondary | ICD-10-CM

## 2019-10-05 DIAGNOSIS — F329 Major depressive disorder, single episode, unspecified: Secondary | ICD-10-CM | POA: Diagnosis not present

## 2019-10-05 DIAGNOSIS — E875 Hyperkalemia: Secondary | ICD-10-CM | POA: Diagnosis present

## 2019-10-05 DIAGNOSIS — E1143 Type 2 diabetes mellitus with diabetic autonomic (poly)neuropathy: Secondary | ICD-10-CM | POA: Diagnosis present

## 2019-10-05 DIAGNOSIS — D508 Other iron deficiency anemias: Secondary | ICD-10-CM | POA: Diagnosis not present

## 2019-10-05 DIAGNOSIS — E785 Hyperlipidemia, unspecified: Secondary | ICD-10-CM | POA: Diagnosis present

## 2019-10-05 DIAGNOSIS — I252 Old myocardial infarction: Secondary | ICD-10-CM

## 2019-10-05 DIAGNOSIS — J449 Chronic obstructive pulmonary disease, unspecified: Secondary | ICD-10-CM | POA: Diagnosis present

## 2019-10-05 DIAGNOSIS — F32A Depression, unspecified: Secondary | ICD-10-CM | POA: Diagnosis present

## 2019-10-05 LAB — SARS CORONAVIRUS 2 BY RT PCR (HOSPITAL ORDER, PERFORMED IN ~~LOC~~ HOSPITAL LAB): SARS Coronavirus 2: NEGATIVE

## 2019-10-05 LAB — COMPREHENSIVE METABOLIC PANEL
ALT: 14 U/L (ref 0–44)
AST: 15 U/L (ref 15–41)
Albumin: 3.2 g/dL — ABNORMAL LOW (ref 3.5–5.0)
Alkaline Phosphatase: 83 U/L (ref 38–126)
Anion gap: 15 (ref 5–15)
BUN: 89 mg/dL — ABNORMAL HIGH (ref 8–23)
CO2: 23 mmol/L (ref 22–32)
Calcium: 8 mg/dL — ABNORMAL LOW (ref 8.9–10.3)
Chloride: 102 mmol/L (ref 98–111)
Creatinine, Ser: 10.78 mg/dL — ABNORMAL HIGH (ref 0.44–1.00)
GFR calc Af Amer: 4 mL/min — ABNORMAL LOW (ref >60)
GFR calc non Af Amer: 3 mL/min — ABNORMAL LOW (ref >60)
Glucose, Bld: 116 mg/dL — ABNORMAL HIGH (ref 70–99)
Potassium: 6.2 mmol/L — ABNORMAL HIGH (ref 3.5–5.1)
Sodium: 140 mmol/L (ref 135–145)
Total Bilirubin: 0.6 mg/dL (ref 0.3–1.2)
Total Protein: 6.7 g/dL (ref 6.5–8.1)

## 2019-10-05 LAB — CBC WITH DIFFERENTIAL/PLATELET
Abs Immature Granulocytes: 0.04 10*3/uL (ref 0.00–0.07)
Basophils Absolute: 0.1 10*3/uL (ref 0.0–0.1)
Basophils Relative: 1 %
Eosinophils Absolute: 0.1 10*3/uL (ref 0.0–0.5)
Eosinophils Relative: 1 %
HCT: 23.9 % — ABNORMAL LOW (ref 36.0–46.0)
Hemoglobin: 7 g/dL — ABNORMAL LOW (ref 12.0–15.0)
Immature Granulocytes: 1 %
Lymphocytes Relative: 21 %
Lymphs Abs: 1.7 10*3/uL (ref 0.7–4.0)
MCH: 27.8 pg (ref 26.0–34.0)
MCHC: 29.3 g/dL — ABNORMAL LOW (ref 30.0–36.0)
MCV: 94.8 fL (ref 80.0–100.0)
Monocytes Absolute: 0.8 10*3/uL (ref 0.1–1.0)
Monocytes Relative: 10 %
Neutro Abs: 5.5 10*3/uL (ref 1.7–7.7)
Neutrophils Relative %: 66 %
Platelets: 340 10*3/uL (ref 150–400)
RBC: 2.52 MIL/uL — ABNORMAL LOW (ref 3.87–5.11)
RDW: 20.2 % — ABNORMAL HIGH (ref 11.5–15.5)
WBC: 8.2 10*3/uL (ref 4.0–10.5)
nRBC: 0 % (ref 0.0–0.2)

## 2019-10-05 LAB — POC OCCULT BLOOD, ED: Fecal Occult Bld: POSITIVE — AB

## 2019-10-05 LAB — LIPASE, BLOOD: Lipase: 35 U/L (ref 11–51)

## 2019-10-05 MED ORDER — IPRATROPIUM BROMIDE 0.02 % IN SOLN: 0.5000 mg | RESPIRATORY_TRACT | Status: DC | PRN

## 2019-10-05 MED ORDER — PANTOPRAZOLE SODIUM 40 MG IV SOLR
40.0000 mg | Freq: Once | INTRAVENOUS | Status: AC
  Administered 2019-10-06: 40 mg via INTRAVENOUS
  Filled 2019-10-05: qty 40

## 2019-10-05 MED ORDER — INSULIN ASPART 100 UNIT/ML ~~LOC~~ SOLN
0.0000 [IU] | Freq: Three times a day (TID) | SUBCUTANEOUS | Status: DC
  Administered 2019-10-07: 13:00:00 2 [IU] via SUBCUTANEOUS
  Administered 2019-10-08: 17:00:00 3 [IU] via SUBCUTANEOUS
  Administered 2019-10-08: 14:00:00 2 [IU] via SUBCUTANEOUS
  Administered 2019-10-09: 5 [IU] via SUBCUTANEOUS
  Administered 2019-10-10: 3 [IU] via SUBCUTANEOUS
  Administered 2019-10-10: 2 [IU] via SUBCUTANEOUS
  Administered 2019-10-10 – 2019-10-11 (×2): 3 [IU] via SUBCUTANEOUS
  Filled 2019-10-05: qty 0.15

## 2019-10-05 MED ORDER — SODIUM CHLORIDE 0.9% IV SOLUTION: Freq: Once | INTRAVENOUS | Status: DC

## 2019-10-05 MED ORDER — MOMETASONE FURO-FORMOTEROL FUM 100-5 MCG/ACT IN AERO
2.0000 | INHALATION_SPRAY | RESPIRATORY_TRACT | Status: DC | PRN
  Filled 2019-10-05: qty 8.8

## 2019-10-05 MED ORDER — SODIUM CHLORIDE 0.9 % IV BOLUS: 1000.0000 mL | Freq: Once | INTRAVENOUS | Status: DC

## 2019-10-05 MED ORDER — GABAPENTIN 300 MG PO CAPS
300.0000 mg | ORAL_CAPSULE | Freq: Every day | ORAL | Status: DC
  Administered 2019-10-06 – 2019-10-10 (×5): 300 mg via ORAL
  Filled 2019-10-05 (×5): qty 1

## 2019-10-05 MED ORDER — CHLORHEXIDINE GLUCONATE CLOTH 2 % EX PADS
6.0000 | MEDICATED_PAD | Freq: Every day | CUTANEOUS | Status: DC
  Administered 2019-10-07 – 2019-10-11 (×4): 6 via TOPICAL

## 2019-10-05 MED ORDER — SODIUM ZIRCONIUM CYCLOSILICATE 10 G PO PACK
10.0000 g | PACK | Freq: Three times a day (TID) | ORAL | Status: AC
  Administered 2019-10-05: 10 g via ORAL
  Filled 2019-10-05 (×2): qty 1

## 2019-10-05 MED ORDER — PANTOPRAZOLE SODIUM 40 MG IV SOLR
40.0000 mg | Freq: Once | INTRAVENOUS | Status: AC
  Administered 2019-10-05: 40 mg via INTRAVENOUS
  Filled 2019-10-05: qty 40

## 2019-10-05 MED ORDER — BUPROPION HCL ER (SR) 150 MG PO TB12
150.0000 mg | ORAL_TABLET | Freq: Two times a day (BID) | ORAL | Status: DC
  Administered 2019-10-06 – 2019-10-11 (×12): 150 mg via ORAL
  Filled 2019-10-05 (×13): qty 1

## 2019-10-05 MED ORDER — SODIUM CHLORIDE 0.9 % IV BOLUS
500.0000 mL | Freq: Once | INTRAVENOUS | Status: AC
  Administered 2019-10-05: 19:00:00 500 mL via INTRAVENOUS

## 2019-10-05 MED ORDER — IPRATROPIUM BROMIDE HFA 17 MCG/ACT IN AERS: 2.0000 | INHALATION_SPRAY | Freq: Four times a day (QID) | RESPIRATORY_TRACT | Status: DC | PRN

## 2019-10-05 MED ORDER — SODIUM BICARBONATE 8.4 % IV SOLN
50.0000 meq | Freq: Once | INTRAVENOUS | Status: AC
  Administered 2019-10-06: 50 meq via INTRAVENOUS
  Filled 2019-10-05: qty 50

## 2019-10-05 MED ORDER — FERROUS SULFATE 325 (65 FE) MG PO TABS
325.0000 mg | ORAL_TABLET | Freq: Two times a day (BID) | ORAL | Status: DC
  Administered 2019-10-06 – 2019-10-09 (×6): 325 mg via ORAL
  Filled 2019-10-05 (×8): qty 1

## 2019-10-05 NOTE — H&P (Signed)
History and Physical    [name] [name]:[id number] DOB: 03/16/1951 DOA: 10/05/2019  PCP: [facility]  Patient coming from: Home, lives with aunt and uncle  I have personally briefly reviewed patient's old medical records in [place]  Chief Complaint: Fall  HPI: [name] [name] is a 68 y.o. female with medical history significant of CAD status post CABG, end-stage renal disease on dialysis Tuesday/Thursday/Fridays?, diastolic and systolic congestive heart failure, CVA, history of hemoccult stool and anemia, type 2 diabetes, parathyroidism who presented status post dizziness and fall.  Patient was a difficult historian as she would frequently fall asleep during HPI and unable to provide much history unless prompted with specific questions.  Also difficult to understand speech given lack of dentition. Patient reported to me that she presented to ED after having decreased appetite while eating last night.  When asked about her frequent falls, she states that she has been feeling dizzy and feeling unsteady even with her cane when ambulating. Per ED documentation, patient was seen at [facility] yesterday for fall without any acute findings.  Orthostatic vital signs was normal at that time.  Her hemoglobin was found to be 7.8.  When asked about her anemia, she states she has had 3 days of dark stools with no bright red blood per rectum.  She also reports that she has missed dialysis for this entire week.  Currently, she denies any symptoms.  She denies any chest pain, palpitations shortness of breath.  Denies any nausea, vomiting, abdominal pain. Endorses tobacco use of half a pack per day.  Denies alcohol or illicit drug use although has had record of cocaine use in the past.    ED Course: Patient was afebrile and normotensive on room air.  She did briefly require 3 L of oxygen for oxygen saturation in the low 80s during ambulation.  Patient had oxygen saturation of 94%  on room air during my evaluation. CBC showed hemoglobin of 7 from 7.8 yesterday.  Hemoglobin a month ago was at 9.3.  Fecal occult blood was positive and black tarry stool was seen by EDP.  CMP showed elevated potassium at 6.2, creatinine of 10.78. EKG showed normal sinus rhythm with prolonged QTC and nonspecific T wave changes.  There is no significant change from a prior EKG; QTC is actually improved since last EKG . EDP discussed case with gastroenterologist who recommended IV PPI and n.p.o. past midnight.  Nephrology also consulted and recommended 2 doses of Lokelma and an amp of bicarb. Nephrology reportedly felt reasonable to give her a unit of blood although cautioned increase potassium.    Review of Systems:  Constitutional: No Weight Change, No Fever ENT/Mouth: No sore throat, No Rhinorrhea Eyes: No Eye Pain, No Vision Changes Cardiovascular: No Chest Pain, no SOB, No PND, No Dyspnea on Exertion, No Orthopnea, No Claudication, No Edema, No Palpitations Respiratory: No Cough, No Sputum, No Wheezing, no Dyspnea  Gastrointestinal: No Nausea, No Vomiting, No Diarrhea, No Constipation, No Pain Genitourinary: no Urinary Incontinence, No Urgency, No Flank Pain Musculoskeletal: No Arthralgias, No Myalgias Skin: No Skin Lesions, No Pruritus, Neuro: no Weakness, No Numbness,  No Loss of Consciousness, No Syncope Psych: No Anxiety/Panic, No Depression, no decrease appetite Heme/Lymph: No Bruising, No Bleeding  Past Medical History:  Diagnosis Date  . Abscess   . Acute blood loss anemia 08/17/2019  . Acute respiratory failure ([place]) 10/18/2014  . Acute respiratory failure with hypoxia and hypercapnia ([place]) 06/01/2019  .  Anemia 08/2016  . Angiodysplasia of colon   . Arthritis of left shoulder region 03/23/2013  . Bleeding gastrointestinal   . Cardiomegaly 05/2019  . Chest pain 04/17/2016  . Chronic combined systolic and diastolic CHF (congestive heart failure) (HCC)    a. EF 40-45%, mild  LVH, mid apicalanteroseptal and apical HK.  . CKD (chronic kidney disease), stage III   . Cocaine abuse ([place])    crack cocaine heavily until 2008 then sporadic use since then  . Coronary artery disease    a. 06/2012 NSTEMI/CABG x 3 (LIMA->LAD, VG->OM2, VG->LCX);  b. 04/2015 MV: EF<30%, mid ant, apicalanterior, apical infarct;  c. 04/2015 Cath: LM nl, LAD 90p, LCX 59m, OM1 min irregs, RCA mild dzs, LIMA->LAD nl w/ dist LAD dzs, VG->OM2 nl, VG->LCX nl-->Med Rx.  . CVA (cerebral infarction)    a. right internal capsule stroke in 12/2006  . Demand ischemia ([place])   . Diabetes mellitus    diagnosed in 2008  . Elevated troponin 04/27/2019  . Essential hypertension   . Glaucoma   . Gout   . Heme positive stool   . HFrEF (heart failure with reduced ejection fraction) ([place])   . Hyperlipidemia   . Hyperparathyroidism, secondary renal ([place])   . Hypertensive crisis 06/02/2019  . Left-sided sensory deficit present   . Lobar pneumonia ([place]) 04/27/2019  . Obesity, morbid ([place])   . Pneumonia   . Pulmonary edema 05/2019  . PVD (peripheral vascular disease) ([place])    a. 06/2012 ABI's: R - 0.73, L - 0.71.  Marland Kitchen Renal mass, right   . Sepsis ([place]) 04/27/2019  . Shortness of breath dyspnea   . Stroke ([place])   . Thrombocytosis ([place]) 04/17/2016  . Thyroid nodule    FNA in 2130 showed follicular cells but not definate neoplasm  . Tobacco abuse   . Trichomoniasis     Past Surgical History:  Procedure Laterality Date  . AV FISTULA PLACEMENT Left 08/25/2019   Procedure: ARTERIOVENOUS (AV) BRACHIOCEPHALIC FISTULA CREATION;  Surgeon: Waynetta Sandy, MD;  Location: [place];  Service: Vascular;  Laterality: Left;  . CARDIAC CATHETERIZATION    . CARDIAC CATHETERIZATION N/A 05/17/2015   Procedure: Left Heart Cath and Cors/Grafts Angiography;  Surgeon: [name], MD;  Location: [facility] CV LAB;  Service: Cardiovascular;  Laterality: N/A;  . COLONOSCOPY WITH PROPOFOL N/A 04/21/2016   Procedure: COLONOSCOPY  WITH PROPOFOL;  Surgeon: Irene Shipper, MD;  Location: [facility];  Service: Endoscopy;  Laterality: N/A;  . CORONARY ARTERY BYPASS GRAFT  07/09/2012   Procedure: CORONARY ARTERY BYPASS GRAFTING (CABG);  Surgeon: Ivin Poot, MD;  Location: [facility];  Service: Open Heart Surgery;  Laterality: N/A;  . ESOPHAGOGASTRODUODENOSCOPY N/A 04/20/2016   Procedure: ESOPHAGOGASTRODUODENOSCOPY (EGD);  Surgeon: Gatha Mayer, MD;  Location[facility] ENDOSCOPY;  Service: Endoscopy;  Laterality: N/A;  . ESOPHAGOGASTRODUODENOSCOPY (EGD) WITH PROPOFOL N/A 08/17/2019   Procedure: ESOPHAGOGASTRODUODENOSCOPY (EGD) WITH PROPOFOL;  Surgeon: Irene Shipper, MD;  Location: [facility] ENDOSCOPY;  Service: Endoscopy;  Laterality: N/A;  . INSERTION OF DIALYSIS CATHETER Right 08/25/2019   Procedure: INSERTION OF DIALYSIS CATHETER;  Surgeon: Waynetta Sandy, MD;  Location: [facility];  Service: Vascular;  Laterality: Right;  . IR FLUORO GUIDE CV LINE RIGHT  08/19/2019  . IR US GUIDE VASC ACCESS RIGHT  08/19/2019  . LEFT HEART CATHETERIZATION WITH CORONARY ANGIOGRAM N/A 06/29/2012   Procedure: LEFT HEART CATHETERIZATION WITH CORONARY ANGIOGRAM;  Surgeon: Peter M Martinique, MD;  Location: [facility] CATH LAB;  Service: Cardiovascular;  Laterality: N/A;  . STERNAL WOUND DEBRIDEMENT  08/17/2012   Procedure: STERNAL WOUND DEBRIDEMENT;  Surgeon: Ivin Poot, MD;  Location: [facility] OR;  Service: Thoracic;  Laterality: N/A;  wound vac application  . STERNAL WOUND DEBRIDEMENT  08/24/2012   Procedure: STERNAL WOUND DEBRIDEMENT;  Surgeon: Ivin Poot, MD;  Location: [place];  Service: Thoracic;  Laterality: N/A;  . STERNAL WOUND DEBRIDEMENT  09/01/2012   Procedure: STERNAL WOUND DEBRIDEMENT;  Surgeon: Ivin Poot, MD;  Location: [place];  Service: Thoracic;  Laterality: N/A;  . STERNAL WOUND DEBRIDEMENT  09/20/2012   Procedure: STERNAL WOUND DEBRIDEMENT;  Surgeon: Ivin Poot, MD;  Location: [facility] OR;  Service: Thoracic;  Laterality: N/A;  wound vac change      reports that she has been smoking cigarettes. She has a 12.50 pack-year smoking history. She has never used smokeless tobacco. She reports current drug use. Drug: Cocaine. She reports that she does not drink alcohol.  Allergies  Allergen Reactions  . Naproxen Rash    Family History  Problem Relation Age of Onset  . Diabetes Mother   . Hypertension Mother   . Cancer Mother   . Hyperlipidemia Father   . Hypertension Father   . Kidney disease Father   . Gout Father   . Cerebrovascular Accident Father   . Other Other        no known family CAD  : Family history reviewed   Prior to Admission medications   Medication Sig Start Date End Date Taking? Authorizing Provider  allopurinol (ZYLOPRIM) 100 MG tablet Take 100 mg by mouth 2 (two) times daily.  11/25/17  Yes [provider]  amLODipine (NORVASC) 5 MG tablet Take 1 tablet (5 mg total) by mouth daily. 04/30/19 04/29/20 Yes [name], MD  aspirin EC 325 MG tablet Take 325 mg by mouth daily. 05/13/19  Yes [provider]  brinzolamide (AZOPT) 1 % ophthalmic suspension Place 1 drop into both eyes 2 (two) times a day.    Yes [provider]  buPROPion (WELLBUTRIN SR) 150 MG 12 hr tablet Take 1 tablet (150 mg total) by mouth 2 (two) times daily. 01/16/16  Yes Mikhail, Velta Addison, DO  cetirizine (ZYRTEC) 10 MG tablet Take 10 mg by mouth daily.    Yes [provider]  ferrous sulfate 325 (65 FE) MG tablet Take 325 mg by mouth 2 (two) times daily with a meal.    Yes [provider]  gabapentin (NEURONTIN) 300 MG capsule Take 1 capsule (300 mg total) by mouth daily. 08/26/19 10/05/19 Yes Jeanmarie Hubert, MD  insulin aspart (NOVOLOG) 100 UNIT/ML injection Inject 2 Units into the skin 3 (three) times daily. 08/27/19  Yes [provider]  insulin lispro (HUMALOG KWIKPEN) 100 UNIT/ML KwikPen Inject 0.02 mLs (2 Units total) into the skin 3 (three) times daily with meals. 08/26/19  Yes Jeanmarie Hubert, MD   ipratropium (ATROVENT HFA) 17 MCG/ACT inhaler Inhale 2 puffs into the lungs every 6 (six) hours as needed for wheezing.   Yes [provider]  latanoprost (XALATAN) 0.005 % ophthalmic solution Place 1 drop into both eyes at bedtime.   Yes [provider]  LINZESS 72 MCG capsule Take 72 mcg by mouth daily before breakfast.  11/21/17  Yes [provider]  mometasone-formoterol (DULERA) 100-5 MCG/ACT AERO Inhale 2 puffs into the lungs every 4 (four) hours as needed for wheezing.   Yes [provider]  pantoprazole (PROTONIX) 40 MG  tablet Take 1 tablet (40 mg total) by mouth daily. 08/26/19  Yes Jeanmarie Hubert, MD  polyethylene glycol (MIRALAX / GLYCOLAX) 17 g packet Take 17 g by mouth daily as needed for mild constipation.    Yes [provider]  PROAIR HFA 108 (90 Base) MCG/ACT inhaler Inhale 2 puffs into the lungs every 6 (six) hours as needed for wheezing.  11/17/17  Yes [provider]  Grant Ruts INHUB 250-50 MCG/DOSE AEPB Inhale 2 puffs into the lungs daily.  05/04/19  Yes [provider]  atorvastatin (LIPITOR) 40 MG tablet Take 1 tablet (40 mg total) by mouth daily at 6 PM. Patient not taking: Reported on 10/05/2019 06/03/19   Modena Jansky, MD  nitroGLYCERIN (NITROSTAT) 0.4 MG SL tablet Place 1 tablet (0.4 mg total) under the tongue every 5 (five) minutes as needed for chest pain. 04/30/19   [name], MD    Physical Exam: Vitals:   10/05/19 2034 10/05/19 2035 10/05/19 2100 10/05/19 2130  BP:  (!) 100/56 (!) 115/58 120/62  Pulse:  (!) 56 65 66  Resp:  18 17 16   Temp:      TempSrc:      SpO2: 99%  97% 92%    Constitutional: NAD, calm, comfortable, obese female lying flat in bed asleep and snoring Vitals:   10/05/19 2034 10/05/19 2035 10/05/19 2100 10/05/19 2130  BP:  (!) 100/56 (!) 115/58 120/62  Pulse:  (!) 56 65 66  Resp:  18 17 16   Temp:      TempSrc:      SpO2: 99%  97% 92%   Eyes: PERRL, lids and conjunctivae normal  ENMT: Mucous membranes are moist. Posterior pharynx clear of any exudate or lesions.NO dentition.  Neck: normal, supple, no masses Respiratory: Faint expiratory wheezing throughout, no crackles. Normal respiratory effort on room air at 94% oxygen saturation. No accessory muscle use.  Cardiovascular: Regular rate and rhythm, no murmurs / rubs / gallops.  2+ pitting lower extremity edema.  2+ pedal pulses. No carotid bruits.  Abdomen: no tenderness, no masses palpated. Bowel sounds positive.  Musculoskeletal: no clubbing / cyanosis. No joint deformity upper and lower extremities. Good ROM, no contractures. Normal muscle tone.  Skin: no rashes, lesions, ulcers. No induration Neurologic: Patient appears drowsy and with frequently fall asleep during evaluation but would easily awake to light touch.  CN 2-12 grossly intact. Sensation intact, Strength 5/5 in all 4.  Psychiatric: Normal judgment and insight. Alert and oriented x 3. Normal mood.     Labs on Admission: I have personally reviewed following labs and imaging studies  CBC: Recent Labs  Lab 10/04/19 0828 10/05/19 1855  WBC 10.9* 8.2  NEUTROABS  --  5.5  HGB 7.8* 7.0*  HCT 26.7* 23.9*  MCV 94.3 94.8  PLT 355 703   Basic Metabolic Panel: Recent Labs  Lab 10/04/19 0828 10/05/19 1855  NA 140 140  K 4.7 6.2*  CL 97* 102  CO2 26 23  GLUCOSE 137* 116*  BUN 51* 89*  CREATININE 8.87* 10.78*  CALCIUM 8.3* 8.0*   GFR: CrCl cannot be calculated (Unknown ideal weight.). Liver Function Tests: Recent Labs  Lab 10/05/19 1855  AST 15  ALT 14  ALKPHOS 83  BILITOT 0.6  PROT 6.7  ALBUMIN 3.2*   Recent Labs  Lab 10/05/19 1855  LIPASE 35   No results for input(s): AMMONIA in the last 168 hours. Coagulation Profile: No results for input(s): INR, PROTIME in the last 168  hours. Cardiac Enzymes: No results for input(s): CKTOTAL, CKMB, CKMBINDEX, TROPONINI in the last 168 hours. BNP (last 3 results) No results for input(s):  PROBNP in the last 8760 hours. HbA1C: No results for input(s): HGBA1C in the last 72 hours. CBG: Recent Labs  Lab 10/04/19 1137  GLUCAP 139*   Lipid Profile: No results for input(s): CHOL, HDL, LDLCALC, TRIG, CHOLHDL, LDLDIRECT in the last 72 hours. Thyroid Function Tests: No results for input(s): TSH, T4TOTAL, FREET4, T3FREE, THYROIDAB in the last 72 hours. Anemia Panel: No results for input(s): VITAMINB12, FOLATE, FERRITIN, TIBC, IRON, RETICCTPCT in the last 72 hours. Urine analysis:    Component Value Date/Time   COLORURINE YELLOW 08/19/2019 0712   APPEARANCEUR HAZY (A) 08/19/2019 0712   LABSPEC 1.012 08/19/2019 0712   PHURINE 5.0 08/19/2019 0712   GLUCOSEU NEGATIVE 08/19/2019 0712   HGBUR NEGATIVE 08/19/2019 0712   BILIRUBINUR NEGATIVE 08/19/2019 0712   KETONESUR NEGATIVE 08/19/2019 0712   PROTEINUR NEGATIVE 08/19/2019 0712   UROBILINOGEN 0.2 05/12/2015 0938   NITRITE NEGATIVE 08/19/2019 0712   LEUKOCYTESUR SMALL (A) 08/19/2019 0712    Radiological Exams on Admission: Dg Lumbar Spine Complete  Result Date: 10/04/2019 CLINICAL DATA:  Low back pain after a fall this morning. EXAM: LUMBAR SPINE - COMPLETE 4+ VIEW COMPARISON:  None. FINDINGS: Vertebral body height and alignment are maintained. There is loss of disc space height and facet degenerative disease at L4-5 and L5-S1. Paraspinous structures demonstrate extensive atherosclerotic vascular disease. The descending abdominal aorta measures 3.7 cm. IMPRESSION: No acute abnormality. Lower lumbar degenerative disease. Atherosclerosis with a 3.7 cm abdominal aortic aneurysm. Recommend followup by ultrasound in 2 years. This recommendation follows ACR consensus guidelines: White Paper of the ACR Incidental Findings Committee II on Vascular Findings. J Am Coll Radiol 2013; 10:789-794. Electronically Signed   By: Inge Rise M.D.   On: 10/04/2019 09:52   Dg Chest Portable 1 View  Result Date: 10/04/2019 CLINICAL DATA:   Shortness of breath today with fall and low back pain. EXAM: PORTABLE CHEST 1 VIEW COMPARISON:  08/25/2019 FINDINGS: Right IJ dialysis catheter unchanged. Lungs are adequately inflated without lobar consolidation or effusion. Stable cardiomegaly. Remainder of the exam is unchanged. IMPRESSION: No acute cardiopulmonary disease. Stable cardiomegaly. Electronically Signed   By: Marin Olp M.D.   On: 10/04/2019 10:08    EKG: Independently reviewed.   Assessment/Plan  Recurrent falls  -Suspect secondary to missed dialysis and acute anemia -orthostatic normal in ED visit yesterday.  Will repeat - PT consult. Pt normally requires a cane for ambulation.  Hyperkalemia in the setting of end-stage renal disease -EDP consulted with nephrology.  Patient received lokelma x2 and an amp of Bicarb.  No acute EKG changes. -Normal dialysis days on Tuesday, Thursdays, Friday?.  Patient reports missing them all week. -Plan dialysis tomorrow - continue to monitor BMP q4HRS. -Patient was started on 1 unit PRBC in ED for acute anemia.  Will need to monitor closely for hypokalemia status post transfusion.  Acute on chronic anemia -Hemoglobin of 7 on admission.  9.3 a month ago. -Fecal occult blood positive and black tarry stool seen by EDP -Has history of heme positive stool and anemia followed by GI outpatient.  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  Per GI outpatient documentation, patient had plans for repair of her AVM but unsure whether patient actually followed up with this. -GI consulted by EDP and recommended n.p.o. after midnight and possible endoscopy  in the morning.  Nephrology okay with giving 1 unit PRBC which was started by EDP. -Continue to trend CBC every 4 -Continue IV PPI -Continue iron when able to take p.o.  CAD status post CABG -Stable.  Holding aspirin secondary to GI bleed.  History of diastolic and systolic congestive heart failure -Stable.   Hypertension -Hold amlodipine for now as patient has borderline blood pressure and has planned dialysis tomorrow  hX CVA -Hold aspirin secondary to GI bleed  Type 2 diabetes -Normally takes 2 units 3 times daily with meals -We will place on low-dose sliding scale -Continue gabapentin when able to take p.o.  Depression -Continue Wellbutrin  DVT prophylaxis:.None secondary to acute anemia Code Status:DNR Family Communication: Plan discussed with patient at bedside  disposition Plan: Home with at least 2 midnight stays.  Will need transfer to [facility] for dialysis per nephrology and GI consult for acute GI bleed  consults called: Nephrology, GI Admission status: inpatient   Kort Stettler T Colbi Staubs DO [facility]   If 7PM-7AM, please contact night-coverage [email or web address] Password [email or web address]  10/05/2019, 10:08 PM

## 2019-10-05 NOTE — ED Notes (Signed)
Brought pt [place] sandwich, tomato soup, sprite. When pt moved around to eat, her oxygen dropped to low 80s. I applied oxygen at 3L.

## 2019-10-05 NOTE — ED Triage Notes (Signed)
Per EMS-states she has falling 3 times the last 2 days-patient was at [facility] ED yesterday twice-patient stated she is also having blood in her stool

## 2019-10-05 NOTE — ED Provider Notes (Signed)
[facility] DEPT Provider Note   CSN: [id number] Arrival date & time: 10/05/19  1248     History   Chief Complaint Chief Complaint  Patient presents with   Fall   Abdominal Pain    HPI [name] [name] is a 68 y.o. female.     68 yo F with a cc of a fall.  Patient has had 3 falls in 3 days.  She states when she gets up she feels very dizzy and unsteady and then collapses to the ground.  She recently was started on dialysis a couple months ago.  She has been going consistently but missed yesterday due to her visit to the ED.  She has had some left lower quadrant abdominal pain off and on.  Usually happens right before she has a bowel movement and resolves afterwards.  Describes it as colicky.  Patient has noticed that her stool was darker than normal and she felt that she saw some bright red in it as well.  This been going on for the past couple days.  Describes having a significant number of bowel movements as well.  No fevers or chills no cough.  The history is provided by the patient.  Fall Associated symptoms include abdominal pain. Pertinent negatives include no chest pain, no headaches and no shortness of breath.  Abdominal Pain Associated symptoms: no chest pain, no chills, no dysuria, no fever, no nausea, no shortness of breath and no vomiting   Illness Severity:  Moderate Onset quality:  Gradual Duration:  3 days Timing:  Constant Progression:  Worsening Chronicity:  New Associated symptoms: abdominal pain   Associated symptoms: no chest pain, no congestion, no fever, no headaches, no myalgias, no nausea, no rhinorrhea, no shortness of breath, no vomiting and no wheezing     Past Medical History:  Diagnosis Date   Abscess    Acute blood loss anemia 08/17/2019   Acute respiratory failure ([place]) 10/18/2014   Acute respiratory failure with hypoxia and hypercapnia (HCC) 06/01/2019   Anemia 08/2016   Angiodysplasia of colon     Arthritis of left shoulder region 03/23/2013   Bleeding gastrointestinal    Cardiomegaly 05/2019   Chest pain 04/17/2016   Chronic combined systolic and diastolic CHF (congestive heart failure) (HCC)    a. EF 40-45%, mild LVH, mid apicalanteroseptal and apical HK.   CKD (chronic kidney disease), stage III    Cocaine abuse ([place])    crack cocaine heavily until 2008 then sporadic use since then   Coronary artery disease    a. 06/2012 NSTEMI/CABG x 3 (LIMA->LAD, VG->OM2, VG->LCX);  b. 04/2015 MV: EF<30%, mid ant, apicalanterior, apical infarct;  c. 04/2015 Cath: LM nl, LAD 90p, LCX 71m, OM1 min irregs, RCA mild dzs, LIMA->LAD nl w/ dist LAD dzs, VG->OM2 nl, VG->LCX nl-->Med Rx.   CVA (cerebral infarction)    a. right internal capsule stroke in 12/2006   Demand ischemia ([place])    Diabetes mellitus    diagnosed in 2008   Elevated troponin 04/27/2019   Essential hypertension    Glaucoma    Gout    Heme positive stool    HFrEF (heart failure with reduced ejection fraction) ([place])    Hyperlipidemia    Hyperparathyroidism, secondary renal ([place])    Hypertensive crisis 06/02/2019   Left-sided sensory deficit present    Lobar pneumonia ([place]) 04/27/2019   Obesity, morbid ([place])    Pneumonia    Pulmonary edema 05/2019   PVD (peripheral  vascular disease) ([place])    a. 06/2012 ABI's: R - 0.73, L - 0.71.   Renal mass, right    Sepsis ([place]) 04/27/2019   Shortness of breath dyspnea    Stroke ([place])    Thrombocytosis ([place]) 04/17/2016   Thyroid nodule    FNA in 7628 showed follicular cells but not definate neoplasm   Tobacco abuse    Trichomoniasis     Patient Active Problem List   Diagnosis Date Noted   Esophageal stricture    Diabetic gastroparesis ([place])    GI bleed 08/16/2019   Hiatal hernia    Iron deficiency anemia    Microcytic anemia 02/05/2016   Acute on chronic renal failure (HCC)    CAD in native artery    Substance abuse ([place])    Chronic combined  systolic and diastolic CHF (congestive heart failure) ([place])    ESRD on dialysis ([place])    Obesity, Class III, BMI 40-49.9 (morbid obesity) ([place])    Insulin dependent type 2 diabetes mellitus, controlled ([place]) 10/18/2014   S/P CABG (coronary artery bypass graft) 09/21/2013   Arthritis of right shoulder region 03/23/2013   Tobacco abuse    Coronary artery disease    NSTEMI (non-ST elevated myocardial infarction) ([place]) 06/29/2012   History of cocaine abuse ([place]) 06/29/2012   Essential hypertension 06/29/2012   Glaucoma    Hyperlipidemia    History of CVA (cerebrovascular accident)     Past Surgical History:  Procedure Laterality Date   AV FISTULA PLACEMENT Left 08/25/2019   Procedure: ARTERIOVENOUS (AV) BRACHIOCEPHALIC FISTULA CREATION;  Surgeon: Waynetta Sandy, MD;  Location: [facility];  Service: Vascular;  Laterality: Left;   CARDIAC CATHETERIZATION     CARDIAC CATHETERIZATION N/A 05/17/2015   Procedure: Left Heart Cath and Cors/Grafts Angiography;  Surgeon: [name], MD;  Location: [facility] CV LAB;  Service: Cardiovascular;  Laterality: N/A;   COLONOSCOPY WITH PROPOFOL N/A 04/21/2016   Procedure: COLONOSCOPY WITH PROPOFOL;  Surgeon: Irene Shipper, MD;  Location: [facility];  Service: Endoscopy;  Laterality: N/A;   CORONARY ARTERY BYPASS GRAFT  07/09/2012   Procedure: CORONARY ARTERY BYPASS GRAFTING (CABG);  Surgeon: Ivin Poot, MD;  Location: [facility];  Service: Open Heart Surgery;  Laterality: N/A;   ESOPHAGOGASTRODUODENOSCOPY N/A 04/20/2016   Procedure: ESOPHAGOGASTRODUODENOSCOPY (EGD);  Surgeon: Gatha Mayer, MD;  Location: [facility] ENDOSCOPY;  Service: Endoscopy;  Laterality: N/A;   ESOPHAGOGASTRODUODENOSCOPY (EGD) WITH PROPOFOL N/A 08/17/2019   Procedure: ESOPHAGOGASTRODUODENOSCOPY (EGD) WITH PROPOFOL;  Surgeon: Irene Shipper, MD;  Location: [facility] ENDOSCOPY;  Service: Endoscopy;  Laterality: N/A;   INSERTION OF DIALYSIS CATHETER Right 08/25/2019    Procedure: INSERTION OF DIALYSIS CATHETER;  Surgeon: Waynetta Sandy, MD;  Location: [facility];  Service: Vascular;  Laterality: Right;   IR FLUORO GUIDE CV LINE RIGHT  08/19/2019   IR US GUIDE VASC ACCESS RIGHT  08/19/2019   LEFT HEART CATHETERIZATION WITH CORONARY ANGIOGRAM N/A 06/29/2012   Procedure: LEFT HEART CATHETERIZATION WITH CORONARY ANGIOGRAM;  Surgeon: Peter M Martinique, MD;  Location: [facility] CATH LAB;  Service: Cardiovascular;  Laterality: N/A;   STERNAL WOUND DEBRIDEMENT  08/17/2012   Procedure: STERNAL WOUND DEBRIDEMENT;  Surgeon: Ivin Poot, MD;  Location: [facility];  Service: Thoracic;  Laterality: N/A;  wound vac application   STERNAL WOUND DEBRIDEMENT  08/24/2012   Procedure: STERNAL WOUND DEBRIDEMENT;  Surgeon: Ivin Poot, MD;  Location: [facility];  Service: Thoracic;  Laterality: N/A;   STERNAL WOUND DEBRIDEMENT  09/01/2012  Procedure: STERNAL WOUND DEBRIDEMENT;  Surgeon: Ivin Poot, MD;  Location: [facility];  Service: Thoracic;  Laterality: N/A;   STERNAL WOUND DEBRIDEMENT  09/20/2012   Procedure: STERNAL WOUND DEBRIDEMENT;  Surgeon: Ivin Poot, MD;  Location: [facility] OR;  Service: Thoracic;  Laterality: N/A;  wound vac change     OB History   No obstetric history on file.      Home Medications    Prior to Admission medications   Medication Sig Start Date End Date Taking? Authorizing Provider  allopurinol (ZYLOPRIM) 100 MG tablet Take 100 mg by mouth 2 (two) times daily.  11/25/17  Yes [provider]  amLODipine (NORVASC) 5 MG tablet Take 1 tablet (5 mg total) by mouth daily. 04/30/19 04/29/20 Yes [name], MD  aspirin EC 325 MG tablet Take 325 mg by mouth daily. 05/13/19  Yes [provider]  brinzolamide (AZOPT) 1 % ophthalmic suspension Place 1 drop into both eyes 2 (two) times a day.    Yes [provider]  buPROPion (WELLBUTRIN SR) 150 MG 12 hr tablet Take 1 tablet (150 mg total) by mouth 2 (two) times daily. 01/16/16  Yes Mikhail,  Velta Addison, DO  cetirizine (ZYRTEC) 10 MG tablet Take 10 mg by mouth daily.    Yes [provider]  ferrous sulfate 325 (65 FE) MG tablet Take 325 mg by mouth 2 (two) times daily with a meal.    Yes [provider]  gabapentin (NEURONTIN) 300 MG capsule Take 1 capsule (300 mg total) by mouth daily. 08/26/19 10/05/19 Yes Jeanmarie Hubert, MD  insulin aspart (NOVOLOG) 100 UNIT/ML injection Inject 2 Units into the skin 3 (three) times daily. 08/27/19  Yes [provider]  insulin lispro (HUMALOG KWIKPEN) 100 UNIT/ML KwikPen Inject 0.02 mLs (2 Units total) into the skin 3 (three) times daily with meals. 08/26/19  Yes Jeanmarie Hubert, MD  ipratropium (ATROVENT HFA) 17 MCG/ACT inhaler Inhale 2 puffs into the lungs every 6 (six) hours as needed for wheezing.   Yes [provider]  latanoprost (XALATAN) 0.005 % ophthalmic solution Place 1 drop into both eyes at bedtime.   Yes [provider]  LINZESS 72 MCG capsule Take 72 mcg by mouth daily before breakfast.  11/21/17  Yes [provider]  mometasone-formoterol (DULERA) 100-5 MCG/ACT AERO Inhale 2 puffs into the lungs every 4 (four) hours as needed for wheezing.   Yes [provider]  pantoprazole (PROTONIX) 40 MG tablet Take 1 tablet (40 mg total) by mouth daily. 08/26/19  Yes Jeanmarie Hubert, MD  polyethylene glycol (MIRALAX / GLYCOLAX) 17 g packet Take 17 g by mouth daily as needed for mild constipation.    Yes [provider]  PROAIR HFA 108 (90 Base) MCG/ACT inhaler Inhale 2 puffs into the lungs every 6 (six) hours as needed for wheezing.  11/17/17  Yes [provider]  Grant Ruts INHUB 250-50 MCG/DOSE AEPB Inhale 2 puffs into the lungs daily.  05/04/19  Yes [provider]  atorvastatin (LIPITOR) 40 MG tablet Take 1 tablet (40 mg total) by mouth daily at 6 PM. Patient not taking: Reported on 10/05/2019 06/03/19   Modena Jansky, MD  nitroGLYCERIN (NITROSTAT) 0.4 MG SL tablet  Place 1 tablet (0.4 mg total) under the tongue every 5 (five) minutes as needed for chest pain. 04/30/19   [name], MD    Family History Family History  Problem Relation Age of Onset   Diabetes Mother    Hypertension Mother  Cancer Mother    Hyperlipidemia Father    Hypertension Father    Kidney disease Father    Gout Father    Cerebrovascular Accident Father    Other Other        no known family CAD    Social History Social History   Tobacco Use   Smoking status: Current Some Day Smoker    Packs/day: 0.25    Years: 50.00    Pack years: 12.50    Types: Cigarettes   Smokeless tobacco: Never Used  Substance Use Topics   Alcohol use: No    Alcohol/week: 0.0 standard drinks   Drug use: Yes    Types: Cocaine    Comment: pt denies at current time     Allergies   Naproxen   Review of Systems Review of Systems  Constitutional: Negative for chills and fever.  HENT: Negative for congestion and rhinorrhea.   Eyes: Negative for redness and visual disturbance.  Respiratory: Negative for shortness of breath and wheezing.   Cardiovascular: Negative for chest pain and palpitations.  Gastrointestinal: Positive for abdominal pain and blood in stool. Negative for nausea and vomiting.  Genitourinary: Negative for dysuria and urgency.  Musculoskeletal: Negative for arthralgias and myalgias.  Skin: Negative for pallor and wound.  Neurological: Positive for dizziness and weakness. Negative for headaches.     Physical Exam Updated Vital Signs BP 113/65    Pulse 65    Temp 99.1 F (37.3 C) (Oral)    Resp 18    SpO2 100%   Physical Exam Vitals signs and nursing note reviewed.  Constitutional:      General: She is not in acute distress.    Appearance: She is well-developed. She is not diaphoretic.  HENT:     Head: Normocephalic and atraumatic.  Eyes:     Pupils: Pupils are equal, round, and reactive to light.  Neck:     Musculoskeletal: Normal range of motion  and neck supple.  Cardiovascular:     Rate and Rhythm: Normal rate and regular rhythm.     Heart sounds: No murmur. No friction rub. No gallop.      Comments: Right tunneled dialysis catheter Pulmonary:     Effort: Pulmonary effort is normal.     Breath sounds: No wheezing or rales.  Abdominal:     General: There is no distension.     Palpations: Abdomen is soft.     Tenderness: There is no abdominal tenderness.  Musculoskeletal:        General: No tenderness.  Skin:    General: Skin is warm and dry.  Neurological:     Mental Status: She is alert and oriented to person, place, and time.  Psychiatric:        Behavior: Behavior normal.      ED Treatments / Results  Labs (all labs ordered are listed, but only abnormal results are displayed) Labs Reviewed  CBC WITH DIFFERENTIAL/PLATELET - Abnormal; Notable for the following components:      Result Value   RBC 2.52 (*)    Hemoglobin 7.0 (*)    HCT 23.9 (*)    MCHC 29.3 (*)    RDW 20.2 (*)    All other components within normal limits  COMPREHENSIVE METABOLIC PANEL - Abnormal; Notable for the following components:   Potassium 6.2 (*)    Glucose, Bld 116 (*)    BUN 89 (*)    Creatinine, Ser 10.78 (*)    Calcium 8.0 (*)  Albumin 3.2 (*)    GFR calc non Af Amer 3 (*)    GFR calc Af Amer 4 (*)    All other components within normal limits  POC OCCULT BLOOD, ED - Abnormal; Notable for the following components:   Fecal Occult Bld POSITIVE (*)    All other components within normal limits  SARS CORONAVIRUS 2 (HOSPITAL ORDER, [facility])  LIPASE, BLOOD  PREPARE RBC (CROSSMATCH)    EKG EKG Interpretation  Date/Time:  Wednesday October 05 2019 18:52:58 EDT Ventricular Rate:  63 PR Interval:    QRS Duration: 103 QT Interval:  484 QTC Calculation: 496 R Axis:   35 Text Interpretation:  Sinus rhythm Nonspecific T abnormalities, lateral leads Borderline prolonged QT interval No significant change  since last tracing Confirmed by Deno Etienne [phone]) on 10/05/2019 7:12:22 PM   Radiology Dg Lumbar Spine Complete  Result Date: 10/04/2019 CLINICAL DATA:  Low back pain after a fall this morning. EXAM: LUMBAR SPINE - COMPLETE 4+ VIEW COMPARISON:  None. FINDINGS: Vertebral body height and alignment are maintained. There is loss of disc space height and facet degenerative disease at L4-5 and L5-S1. Paraspinous structures demonstrate extensive atherosclerotic vascular disease. The descending abdominal aorta measures 3.7 cm. IMPRESSION: No acute abnormality. Lower lumbar degenerative disease. Atherosclerosis with a 3.7 cm abdominal aortic aneurysm. Recommend followup by ultrasound in 2 years. This recommendation follows ACR consensus guidelines: White Paper of the ACR Incidental Findings Committee II on Vascular Findings. J Am Coll Radiol 2013; 10:789-794. Electronically Signed   By: Inge Rise M.D.   On: 10/04/2019 09:52   Dg Chest Portable 1 View  Result Date: 10/04/2019 CLINICAL DATA:  Shortness of breath today with fall and low back pain. EXAM: PORTABLE CHEST 1 VIEW COMPARISON:  08/25/2019 FINDINGS: Right IJ dialysis catheter unchanged. Lungs are adequately inflated without lobar consolidation or effusion. Stable cardiomegaly. Remainder of the exam is unchanged. IMPRESSION: No acute cardiopulmonary disease. Stable cardiomegaly. Electronically Signed   By: Marin Olp M.D.   On: 10/04/2019 10:08    Procedures Procedures (including critical care time)  Medications Ordered in ED Medications  0.9 %  sodium chloride infusion (Manually program via Guardrails IV Fluids) ( Intravenous Rate/Dose Change 10/05/19 2025)  sodium zirconium cyclosilicate (LOKELMA) packet 10 g (has no administration in time range)  sodium bicarbonate injection 50 mEq (has no administration in time range)  sodium chloride 0.9 % bolus 500 mL (500 mLs Intravenous New Bag/Given 10/05/19 1903)  pantoprazole (PROTONIX) injection  40 mg (40 mg Intravenous Given 10/05/19 2038)     Initial Impression / Assessment and Plan / ED Course  I have reviewed the triage vital signs and the nursing notes.  Pertinent labs & imaging results that were available during my care of the patient were reviewed by me and considered in my medical decision making (see chart for details).        68 yo  F with a chief complaints of 3 falls in 3 days.  No specific injury in the fall.  Patient states that she feels very dizzy when she stands up.  Recently started on dialysis about a month and a half ago.  When I reviewed the patient's last visits I noticed that her hemoglobin had dropped significantly from day 1 to day 2.  Patient states that she has had some dark stool and some blood in her stool.  My rectal exam with some dark and tarry stool.  No obvious melena.  Will obtain lab work give a small fluid boluses the patient's blood pressure is mildly low.  Patient's blood pressure is improved.  Hemoglobin continues to trend lower.  7 today.  Hemoccult positive.  Discussed with Dr. Bryan Lemma, gastroenterology.  He recommended making the patient n.p.o. at midnight and they will evaluate her for possible endoscopy.  Recommended giving a PPI.  Patient's potassium is elevated at 6.2.  I discussed case with Harrie Jeans, nephrology.  She recommended that I give 2 doses of Lokelma.  Recommend giving an amp of bicarb.  She did feel it was reasonable to give her a unit of blood though cautioned that that may increase her potassium as well.  Will discuss with the hospitalist for transfer to [facility].  CRITICAL CARE Performed by: Cecilio Asper   Total critical care time: 80 minutes  Critical care time was exclusive of separately billable procedures and treating other patients.  Critical care was necessary to treat or prevent imminent or life-threatening deterioration.  Critical care was time spent personally by me on the following activities:  development of treatment plan with patient and/or surrogate as well as nursing, discussions with consultants, evaluation of patient's response to treatment, examination of patient, obtaining history from patient or surrogate, ordering and performing treatments and interventions, ordering and review of laboratory studies, ordering and review of radiographic studies, pulse oximetry and re-evaluation of patient's condition.   The patients results and plan were reviewed and discussed.   Any x-rays performed were independently reviewed by myself.   Differential diagnosis were considered with the presenting HPI.  Medications  0.9 %  sodium chloride infusion (Manually program via Guardrails IV Fluids) ( Intravenous Rate/Dose Change 10/05/19 2025)  sodium zirconium cyclosilicate (LOKELMA) packet 10 g (has no administration in time range)  sodium bicarbonate injection 50 mEq (has no administration in time range)  sodium chloride 0.9 % bolus 500 mL (500 mLs Intravenous New Bag/Given 10/05/19 1903)  pantoprazole (PROTONIX) injection 40 mg (40 mg Intravenous Given 10/05/19 2038)    Vitals:   10/05/19 1914 10/05/19 1920 10/05/19 1924 10/05/19 1951  BP:    113/65  Pulse:  (!) 57 65 65  Resp:  20 17 18   Temp:      TempSrc:      SpO2: 99% (!) 80% 100% 100%    Final diagnoses:  Upper GI bleed  Hyperkalemia    Admission/ observation were discussed with the admitting physician, patient and/or family and they are comfortable with the plan.    Final Clinical Impressions(s) / ED Diagnoses   Final diagnoses:  Upper GI bleed  Hyperkalemia    ED Discharge Orders    None       Deno Etienne, DO 10/05/19 2106

## 2019-10-06 DIAGNOSIS — D508 Other iron deficiency anemias: Secondary | ICD-10-CM

## 2019-10-06 DIAGNOSIS — K922 Gastrointestinal hemorrhage, unspecified: Secondary | ICD-10-CM

## 2019-10-06 LAB — BASIC METABOLIC PANEL
Anion gap: 15 (ref 5–15)
BUN: 93 mg/dL — ABNORMAL HIGH (ref 8–23)
CO2: 23 mmol/L (ref 22–32)
Calcium: 7.7 mg/dL — ABNORMAL LOW (ref 8.9–10.3)
Chloride: 101 mmol/L (ref 98–111)
Creatinine, Ser: 11.41 mg/dL — ABNORMAL HIGH (ref 0.44–1.00)
GFR calc Af Amer: 4 mL/min — ABNORMAL LOW (ref >60)
GFR calc non Af Amer: 3 mL/min — ABNORMAL LOW (ref >60)
Glucose, Bld: 194 mg/dL — ABNORMAL HIGH (ref 70–99)
Potassium: 5.3 mmol/L — ABNORMAL HIGH (ref 3.5–5.1)
Sodium: 139 mmol/L (ref 135–145)

## 2019-10-06 LAB — CBC
HCT: 26 % — ABNORMAL LOW (ref 36.0–46.0)
Hemoglobin: 8.4 g/dL — ABNORMAL LOW (ref 12.0–15.0)
MCH: 28.8 pg (ref 26.0–34.0)
MCHC: 32.3 g/dL (ref 30.0–36.0)
MCV: 89 fL (ref 80.0–100.0)
Platelets: 266 10*3/uL (ref 150–400)
RBC: 2.92 MIL/uL — ABNORMAL LOW (ref 3.87–5.11)
RDW: 19.1 % — ABNORMAL HIGH (ref 11.5–15.5)
WBC: 8.1 10*3/uL (ref 4.0–10.5)
nRBC: 0.5 % — ABNORMAL HIGH (ref 0.0–0.2)

## 2019-10-06 LAB — IRON AND TIBC
Iron: 27 ug/dL — ABNORMAL LOW (ref 28–170)
Saturation Ratios: 12 % (ref 10.4–31.8)
TIBC: 235 ug/dL — ABNORMAL LOW (ref 250–450)
UIBC: 208 ug/dL

## 2019-10-06 LAB — GLUCOSE, CAPILLARY
Glucose-Capillary: 104 mg/dL — ABNORMAL HIGH (ref 70–99)
Glucose-Capillary: 98 mg/dL (ref 70–99)
Glucose-Capillary: 124 mg/dL — ABNORMAL HIGH (ref 70–99)

## 2019-10-06 LAB — PREPARE RBC (CROSSMATCH)

## 2019-10-06 LAB — VITAMIN B12: Vitamin B-12: 290 pg/mL (ref 180–914)

## 2019-10-06 LAB — ABO/RH: ABO/RH(D): O POS

## 2019-10-06 LAB — HEPATITIS B SURFACE ANTIGEN: Hepatitis B Surface Ag: NONREACTIVE

## 2019-10-06 LAB — FERRITIN: Ferritin: 133 ng/mL (ref 11–307)

## 2019-10-06 LAB — CBG MONITORING, ED: Glucose-Capillary: 207 mg/dL — ABNORMAL HIGH (ref 70–99)

## 2019-10-06 MED ORDER — LIDOCAINE HCL (PF) 1 % IJ SOLN: 5.0000 mL | INTRAMUSCULAR | Status: DC | PRN

## 2019-10-06 MED ORDER — ALTEPLASE 2 MG IJ SOLR: 2.0000 mg | Freq: Once | INTRAMUSCULAR | Status: DC | PRN

## 2019-10-06 MED ORDER — HEPARIN SODIUM (PORCINE) 1000 UNIT/ML IJ SOLN
INTRAMUSCULAR | Status: AC
  Filled 2019-10-06: qty 3

## 2019-10-06 MED ORDER — HEPARIN SODIUM (PORCINE) 1000 UNIT/ML DIALYSIS: 1000.0000 [IU] | INTRAMUSCULAR | Status: DC | PRN

## 2019-10-06 MED ORDER — SODIUM CHLORIDE 0.9 % IV SOLN: 100.0000 mL | INTRAVENOUS | Status: DC | PRN

## 2019-10-06 MED ORDER — LIDOCAINE-PRILOCAINE 2.5-2.5 % EX CREA: 1.0000 "application " | TOPICAL_CREAM | CUTANEOUS | Status: DC | PRN

## 2019-10-06 MED ORDER — DARBEPOETIN ALFA 100 MCG/0.5ML IJ SOSY
100.0000 ug | PREFILLED_SYRINGE | INTRAMUSCULAR | Status: DC
  Administered 2019-10-06: 14:00:00 100 ug via INTRAVENOUS

## 2019-10-06 MED ORDER — DARBEPOETIN ALFA 100 MCG/0.5ML IJ SOSY
PREFILLED_SYRINGE | INTRAMUSCULAR | Status: AC
  Filled 2019-10-06: qty 0.5

## 2019-10-06 MED ORDER — PEG-KCL-NACL-NASULF-NA ASC-C 100 G PO SOLR
1.0000 | Freq: Once | ORAL | Status: AC
  Administered 2019-10-06: 200 g via ORAL
  Filled 2019-10-06: qty 1

## 2019-10-06 MED ORDER — PENTAFLUOROPROP-TETRAFLUOROETH EX AERO: 1.0000 "application " | INHALATION_SPRAY | CUTANEOUS | Status: DC | PRN

## 2019-10-06 NOTE — ED Notes (Signed)
Call [place]

## 2019-10-06 NOTE — ED Notes (Signed)
Blood transfusion completed. Waiting the standard 1 hour for re-check of CBC/BMP.

## 2019-10-06 NOTE — ED Notes (Signed)
Pt verbalized understanding/consent of Plan of Care and transport. Pt stable denies pain

## 2019-10-06 NOTE — ED Notes (Signed)
[facility] at bedside to transport pt. [facility] came out of the room to inform this RN that the "pt stood to transfer then decided to sit down and was lowered to the floor"  Upon entering the room this RN assessed pt sitting on floor denies injury, pain , or LOC. [facility] EMT, RN, and This RN/Mandy Charity fundraiser assisted pt back on to stretcher. Educated pt on fall risk due to weakness. This RN noted that pt had yellow socks and fall risk band prior to [facility] attempting transfer. VSS

## 2019-10-06 NOTE — ED Notes (Signed)
Pt receiving blood transfusion. Unable to obtain blood work at this time.

## 2019-10-06 NOTE — ED Notes (Addendum)
Pt transfer to [facility] via [facility] with all belongings including cane. Clothing, jewelry, glasses, and phone

## 2019-10-06 NOTE — Consult Note (Addendum)
Referring Provider:  Dr. Tyrone Nine, [place] Primary Care Physician:  [facility] Primary Gastroenterologist:  Dr. Carlean Purl  Reason for Consultation:  Anemia and melena  HPI: [name] [name] is a 68 y.o. female with medical history significant of CAD status post CABG, end-stage renal disease on dialysis Tuesday/Thursday/Saturday, diastolic and systolic congestive heart failure, CVA, history of hemoccult stool and anemia, type 2 diabetes, parathyroidism.  She missed dialysis on Tuesday and Cr today is 11.  Transferred to [facility] to receive HD.  Apparently admitted for dizziness.  Hgb was 7.0 grams today and was 7.8 grams yesterday and 9.3 grams one month ago.  Received one unit PRBC's.  She was heme positive so GI was consulted.  She was admitted for similar issues in August and GI was consulted.  She underwent EGD at that time that showed the following:  EGD 07/2019 by Dr. Henrene Pastor for anemia and heme positive stools showed the following:  1. Esophageal stricture with mild esophagitis. 2. Hiatal hernia without obvious Cameron erosions 3. Retained gastric contents consistent with diabetic gastroparesis 4. Otherwise normal exam with no evidence of active bleeding 5. Multifactorial anemia 6. History of isolated nonbleeding AVM on prior colonoscopy 7. Progressive renal failure with current BUN 132 and creatinine 6.11  Due to progressive renal disease at that time she was not prepped for colonoscopy.  She is HD at the time of my visit.  Says that she feels good.  Says that her stools are dark and they pretty much has been that way for a while/stay that way.  Denies seeing any red blood in her stools.  No abdominal pain.  Denies NSAID use.  Bowels move well without issues.   Was evaluated for the same issue in 2017 as follows.  03/2016 EGD: For evaluation of IDA, FOBT positive.  Medium sized hiatal hernia, no Cameron's lesions.  Otherwise normal study. 03/2016 colonoscopy: solitary,  nonbleeding colonic AVM at mid ascending colon, not treated.   Past Medical History:  Diagnosis Date  . Abscess   . Acute blood loss anemia 08/17/2019  . Acute respiratory failure ([place]) 10/18/2014  . Acute respiratory failure with hypoxia and hypercapnia ([place]) 06/01/2019  . Anemia 08/2016  . Angiodysplasia of colon   . Arthritis of left shoulder region 03/23/2013  . Bleeding gastrointestinal   . Cardiomegaly 05/2019  . Chest pain 04/17/2016  . Chronic combined systolic and diastolic CHF (congestive heart failure) (HCC)    a. EF 40-45%, mild LVH, mid apicalanteroseptal and apical HK.  . CKD (chronic kidney disease), stage III   . Cocaine abuse ([place])    crack cocaine heavily until 2008 then sporadic use since then  . Coronary artery disease    a. 06/2012 NSTEMI/CABG x 3 (LIMA->LAD, VG->OM2, VG->LCX);  b. 04/2015 MV: EF<30%, mid ant, apicalanterior, apical infarct;  c. 04/2015 Cath: LM nl, LAD 90p, LCX 80m, OM1 min irregs, RCA mild dzs, LIMA->LAD nl w/ dist LAD dzs, VG->OM2 nl, VG->LCX nl-->Med Rx.  . CVA (cerebral infarction)    a. right internal capsule stroke in 12/2006  . Demand ischemia ([place])   . Diabetes mellitus    diagnosed in 2008  . Elevated troponin 04/27/2019  . Essential hypertension   . Glaucoma   . Gout   . Heme positive stool   . HFrEF (heart failure with reduced ejection fraction) ([place])   . Hyperlipidemia   . Hyperparathyroidism, secondary renal ([place])   . Hypertensive crisis 06/02/2019  . Left-sided sensory  deficit present   . Lobar pneumonia ([place]) 04/27/2019  . Obesity, morbid ([place])   . Pneumonia   . Pulmonary edema 05/2019  . PVD (peripheral vascular disease) ([place])    a. 06/2012 ABI's: R - 0.73, L - 0.71.  Marland Kitchen Renal mass, right   . Sepsis ([place]) 04/27/2019  . Shortness of breath dyspnea   . Stroke ([place])   . Thrombocytosis ([place]) 04/17/2016  . Thyroid nodule    FNA in 2202 showed follicular cells but not definate neoplasm  . Tobacco abuse   . Trichomoniasis     Past  Surgical History:  Procedure Laterality Date  . AV FISTULA PLACEMENT Left 08/25/2019   Procedure: ARTERIOVENOUS (AV) BRACHIOCEPHALIC FISTULA CREATION;  Surgeon: Waynetta Sandy, MD;  Location: [place];  Service: Vascular;  Laterality: Left;  . CARDIAC CATHETERIZATION    . CARDIAC CATHETERIZATION N/A 05/17/2015   Procedure: Left Heart Cath and Cors/Grafts Angiography;  Surgeon: [name], MD;  Location: [place] CV LAB;  Service: Cardiovascular;  Laterality: N/A;  . COLONOSCOPY WITH PROPOFOL N/A 04/21/2016   Procedure: COLONOSCOPY WITH PROPOFOL;  Surgeon: Irene Shipper, MD;  Location: [place];  Service: Endoscopy;  Laterality: N/A;  . CORONARY ARTERY BYPASS GRAFT  07/09/2012   Procedure: CORONARY ARTERY BYPASS GRAFTING (CABG);  Surgeon: Ivin Poot, MD;  Location: [place];  Service: Open Heart Surgery;  Laterality: N/A;  . ESOPHAGOGASTRODUODENOSCOPY N/A 04/20/2016   Procedure: ESOPHAGOGASTRODUODENOSCOPY (EGD);  Surgeon: Gatha Mayer, MD;  Location: [facility] ENDOSCOPY;  Service: Endoscopy;  Laterality: N/A;  . ESOPHAGOGASTRODUODENOSCOPY (EGD) WITH PROPOFOL N/A 08/17/2019   Procedure: ESOPHAGOGASTRODUODENOSCOPY (EGD) WITH PROPOFOL;  Surgeon: Irene Shipper, MD;  Location[facility] ENDOSCOPY;  Service: Endoscopy;  Laterality: N/A;  . INSERTION OF DIALYSIS CATHETER Right 08/25/2019   Procedure: INSERTION OF DIALYSIS CATHETER;  Surgeon: Waynetta Sandy, MD;  Location: [place];  Service: Vascular;  Laterality: Right;  . IR FLUORO GUIDE CV LINE RIGHT  08/19/2019  . IR US GUIDE VASC ACCESS RIGHT  08/19/2019  . LEFT HEART CATHETERIZATION WITH CORONARY ANGIOGRAM N/A 06/29/2012   Procedure: LEFT HEART CATHETERIZATION WITH CORONARY ANGIOGRAM;  Surgeon: Peter M Martinique, MD;  Location: [facility] CATH LAB;  Service: Cardiovascular;  Laterality: N/A;  . STERNAL WOUND DEBRIDEMENT  08/17/2012   Procedure: STERNAL WOUND DEBRIDEMENT;  Surgeon: Ivin Poot, MD;  Location: [facility] OR;  Service: Thoracic;  Laterality:  N/A;  wound vac application  . STERNAL WOUND DEBRIDEMENT  08/24/2012   Procedure: STERNAL WOUND DEBRIDEMENT;  Surgeon: Ivin Poot, MD;  Location: [place];  Service: Thoracic;  Laterality: N/A;  . STERNAL WOUND DEBRIDEMENT  09/01/2012   Procedure: STERNAL WOUND DEBRIDEMENT;  Surgeon: Ivin Poot, MD;  Location: [place];  Service: Thoracic;  Laterality: N/A;  . STERNAL WOUND DEBRIDEMENT  09/20/2012   Procedure: STERNAL WOUND DEBRIDEMENT;  Surgeon: Ivin Poot, MD;  Location: [facility] OR;  Service: Thoracic;  Laterality: N/A;  wound vac change    Prior to Admission medications   Medication Sig Start Date End Date Taking? Authorizing Provider  allopurinol (ZYLOPRIM) 100 MG tablet Take 100 mg by mouth 2 (two) times daily.  11/25/17  Yes [provider]  amLODipine (NORVASC) 5 MG tablet Take 1 tablet (5 mg total) by mouth daily. 04/30/19 04/29/20 Yes [name], MD  aspirin EC 325 MG tablet Take 325 mg by mouth daily. 05/13/19  Yes [provider]  brinzolamide (AZOPT) 1 % ophthalmic suspension Place 1 drop into both  eyes 2 (two) times a day.    Yes [provider]  buPROPion (WELLBUTRIN SR) 150 MG 12 hr tablet Take 1 tablet (150 mg total) by mouth 2 (two) times daily. 01/16/16  Yes Mikhail, Velta Addison, DO  cetirizine (ZYRTEC) 10 MG tablet Take 10 mg by mouth daily.    Yes [provider]  ferrous sulfate 325 (65 FE) MG tablet Take 325 mg by mouth 2 (two) times daily with a meal.    Yes [provider]  gabapentin (NEURONTIN) 300 MG capsule Take 1 capsule (300 mg total) by mouth daily. 08/26/19 10/05/19 Yes Jeanmarie Hubert, MD  insulin aspart (NOVOLOG) 100 UNIT/ML injection Inject 2 Units into the skin 3 (three) times daily. 08/27/19  Yes [provider]  insulin lispro (HUMALOG KWIKPEN) 100 UNIT/ML KwikPen Inject 0.02 mLs (2 Units total) into the skin 3 (three) times daily with meals. 08/26/19  Yes Jeanmarie Hubert, MD  ipratropium (ATROVENT HFA) 17 MCG/ACT  inhaler Inhale 2 puffs into the lungs every 6 (six) hours as needed for wheezing.   Yes [provider]  latanoprost (XALATAN) 0.005 % ophthalmic solution Place 1 drop into both eyes at bedtime.   Yes [provider]  LINZESS 72 MCG capsule Take 72 mcg by mouth daily before breakfast.  11/21/17  Yes [provider]  mometasone-formoterol (DULERA) 100-5 MCG/ACT AERO Inhale 2 puffs into the lungs every 4 (four) hours as needed for wheezing.   Yes [provider]  pantoprazole (PROTONIX) 40 MG tablet Take 1 tablet (40 mg total) by mouth daily. 08/26/19  Yes Jeanmarie Hubert, MD  polyethylene glycol (MIRALAX / GLYCOLAX) 17 g packet Take 17 g by mouth daily as needed for mild constipation.    Yes [provider]  PROAIR HFA 108 (90 Base) MCG/ACT inhaler Inhale 2 puffs into the lungs every 6 (six) hours as needed for wheezing.  11/17/17  Yes [provider]  Grant Ruts INHUB 250-50 MCG/DOSE AEPB Inhale 2 puffs into the lungs daily.  05/04/19  Yes [provider]  atorvastatin (LIPITOR) 40 MG tablet Take 1 tablet (40 mg total) by mouth daily at 6 PM. Patient not taking: Reported on 10/05/2019 06/03/19   Modena Jansky, MD  nitroGLYCERIN (NITROSTAT) 0.4 MG SL tablet Place 1 tablet (0.4 mg total) under the tongue every 5 (five) minutes as needed for chest pain. 04/30/19   [name], MD    Current Facility-Administered Medications  Medication Dose Route Frequency Provider Last Rate Last Dose  . 0.9 %  sodium chloride infusion (Manually program via Guardrails IV Fluids)   Intravenous Once Deno Etienne, DO   Stopped at 10/06/19 0700  . 0.9 %  sodium chloride infusion  100 mL Intravenous PRN Harrie Jeans C, MD      . 0.9 %  sodium chloride infusion  100 mL Intravenous PRN [name], MD      . 0.9 %  sodium chloride infusion  100 mL Intravenous PRN Edrick Oh, MD      . 0.9 %  sodium chloride infusion  100 mL Intravenous PRN Edrick Oh, MD      .  alteplase (CATHFLO ACTIVASE) injection 2 mg  2 mg Intracatheter Once PRN [name], MD      . alteplase (CATHFLO ACTIVASE) injection 2 mg  2 mg Intracatheter Once PRN Edrick Oh, MD      . buPROPion [facility] SR) 12 hr tablet 150 mg  150 mg Oral BID Tu, Ching T,  DO   150 mg at 10/06/19 0033  . Chlorhexidine Gluconate Cloth 2 % PADS 6 each  6 each Topical Q0600 [name], MD      . ferrous sulfate tablet 325 mg  325 mg Oral BID WC Tu, Ching T, DO      . gabapentin (NEURONTIN) capsule 300 mg  300 mg Oral Daily Tu, Ching T, DO      . heparin injection 1,000 Units  1,000 Units Dialysis PRN [name], MD      . heparin injection 1,000 Units  1,000 Units Dialysis PRN Edrick Oh, MD      . insulin aspart (novoLOG) injection 0-15 Units  0-15 Units Subcutaneous TID WC Tu, Ching T, DO      . ipratropium (ATROVENT) nebulizer solution 0.5 mg  0.5 mg Nebulization Q4H PRN Tu, Ching T, DO      . lidocaine (PF) (XYLOCAINE) 1 % injection 5 mL  5 mL Intradermal PRN [name], MD      . lidocaine (PF) (XYLOCAINE) 1 % injection 5 mL  5 mL Intradermal PRN Edrick Oh, MD      . lidocaine-prilocaine (EMLA) cream 1 application  1 application Topical PRN [name], MD      . lidocaine-prilocaine (EMLA) cream 1 application  1 application Topical PRN Edrick Oh, MD      . mometasone-formoterol (DULERA) 100-5 MCG/ACT inhaler 2 puff  2 puff Inhalation Q4H PRN Tu, Ching T, DO      . pentafluoroprop-tetrafluoroeth (GEBAUERS) aerosol 1 application  1 application Topical PRN [name], MD      . pentafluoroprop-tetrafluoroeth Landry Dyke) aerosol 1 application  1 application Topical PRN Edrick Oh, MD      . sodium zirconium cyclosilicate [facility]) packet 10 g  10 g Oral Q8H Floyd, Dan, DO   10 g at 10/05/19 2313   Current Outpatient Medications  Medication Sig Dispense Refill  . allopurinol (ZYLOPRIM) 100 MG tablet Take 100 mg by mouth 2 (two) times daily.     Marland Kitchen amLODipine (NORVASC) 5  MG tablet Take 1 tablet (5 mg total) by mouth daily. 30 tablet 11  . aspirin EC 325 MG tablet Take 325 mg by mouth daily.    . brinzolamide (AZOPT) 1 % ophthalmic suspension Place 1 drop into both eyes 2 (two) times a day.     Marland Kitchen buPROPion (WELLBUTRIN SR) 150 MG 12 hr tablet Take 1 tablet (150 mg total) by mouth 2 (two) times daily. 60 tablet 0  . cetirizine (ZYRTEC) 10 MG tablet Take 10 mg by mouth daily.     . ferrous sulfate 325 (65 FE) MG tablet Take 325 mg by mouth 2 (two) times daily with a meal.     . gabapentin (NEURONTIN) 300 MG capsule Take 1 capsule (300 mg total) by mouth daily. 30 capsule 0  . insulin aspart (NOVOLOG) 100 UNIT/ML injection Inject 2 Units into the skin 3 (three) times daily.    . insulin lispro (HUMALOG KWIKPEN) 100 UNIT/ML KwikPen Inject 0.02 mLs (2 Units total) into the skin 3 (three) times daily with meals. 3 mL 0  . ipratropium (ATROVENT HFA) 17 MCG/ACT inhaler Inhale 2 puffs into the lungs every 6 (six) hours as needed for wheezing.    . latanoprost (XALATAN) 0.005 % ophthalmic solution Place 1 drop into both eyes at bedtime.    Marland Kitchen LINZESS 72 MCG capsule Take 72 mcg by mouth daily before breakfast.   3  .  mometasone-formoterol (DULERA) 100-5 MCG/ACT AERO Inhale 2 puffs into the lungs every 4 (four) hours as needed for wheezing.    . pantoprazole (PROTONIX) 40 MG tablet Take 1 tablet (40 mg total) by mouth daily. 30 tablet 0  . polyethylene glycol (MIRALAX / GLYCOLAX) 17 g packet Take 17 g by mouth daily as needed for mild constipation.     Marland Kitchen PROAIR HFA 108 (90 Base) MCG/ACT inhaler Inhale 2 puffs into the lungs every 6 (six) hours as needed for wheezing.   3  . WIXELA INHUB 250-50 MCG/DOSE AEPB Inhale 2 puffs into the lungs daily.     Marland Kitchen atorvastatin (LIPITOR) 40 MG tablet Take 1 tablet (40 mg total) by mouth daily at 6 PM. (Patient not taking: Reported on 10/05/2019) 30 tablet 0  . nitroGLYCERIN (NITROSTAT) 0.4 MG SL tablet Place 1 tablet (0.4 mg total) under the  tongue every 5 (five) minutes as needed for chest pain. 30 tablet 0    Allergies as of 10/05/2019 - Review Complete 10/05/2019  Allergen Reaction Noted  . Naproxen Rash 09/21/2013    Family History  Problem Relation Age of Onset  . Diabetes Mother   . Hypertension Mother   . Cancer Mother   . Hyperlipidemia Father   . Hypertension Father   . Kidney disease Father   . Gout Father   . Cerebrovascular Accident Father   . Other Other        no known family CAD    Social History   Socioeconomic History  . Marital status: Single    Spouse name: Not on file  . Number of children: 0  . Years of education: Not on file  . Highest education level: Not on file  Occupational History  . Not on file  Social Needs  . Financial resource strain: Not on file  . Food insecurity    Worry: Not on file    Inability: Not on file  . Transportation needs    Medical: Not on file    Non-medical: Not on file  Tobacco Use  . Smoking status: Current Some Day Smoker    Packs/day: 0.25    Years: 50.00    Pack years: 12.50    Types: Cigarettes  . Smokeless tobacco: Never Used  Substance and Sexual Activity  . Alcohol use: No    Alcohol/week: 0.0 standard drinks  . Drug use: Yes    Types: Cocaine    Comment: pt denies at current time  . Sexual activity: Not Currently  Lifestyle  . Physical activity    Days per week: Not on file    Minutes per session: Not on file  . Stress: Not on file  Relationships  . Social Herbalist on phone: Not on file    Gets together: Not on file    Attends religious service: Not on file    Active member of club or organization: Not on file    Attends meetings of clubs or organizations: Not on file    Relationship status: Not on file  . Intimate partner violence    Fear of current or ex partner: Not on file    Emotionally abused: Not on file    Physically abused: Not on file    Forced sexual activity: Not on file  Other Topics Concern  . Not  on file  Social History Narrative   Lives with boyfriend of 20 years.     Review of Systems: ROS is O/W  negative except as mentioned in HPI.  Physical Exam: Vital signs in last 24 hours: Temp:  [98.3 F (36.8 C)-99.1 F (37.3 C)] 98.3 F (36.8 C) (10/08 0357) Pulse Rate:  [56-94] 76 (10/08 0810) Resp:  [13-30] 18 (10/08 0810) BP: (96-172)/(44-108) 156/74 (10/08 0810) SpO2:  [80 %-100 %] 99 % (10/08 0810) Weight:  [123.1 kg] 123.1 kg (10/08 0720)   General:  Alert, Well-developed, well-nourished, pleasant and cooperative in NAD Head:  Normocephalic and atraumatic. Eyes:  Sclera clear, no icterus.  Conjunctiva pink. Ears:  Normal auditory acuity. Mouth:  No deformity or lesions.   Lungs:  Clear throughout to auscultation.   No wheezes, crackles, or rhonchi.  Heart:  Regular rate and rhythm; no murmurs, clicks, rubs, or gallops. Abdomen:  Soft, obese, non-distended.  BS present.  Non-tender.   Rectal:  Deferred.  Heme positive by ED.  Msk:  Symmetrical without gross deformities. Pulses:  Normal pulses noted. Extremities:  Without clubbing or edema. Neurologic:  Alert and oriented x 4;  grossly normal neurologically. Skin:  Intact without significant lesions or rashes. Psych:  Alert and cooperative. Normal mood and affect.  Intake/Output from previous day: 10/07 0701 - 10/08 0700 In: 315 [Blood:315] Out: -   Lab Results: Recent Labs    10/04/19 0828 10/05/19 1855  WBC 10.9* 8.2  HGB 7.8* 7.0*  HCT 26.7* 23.9*  PLT 355 340   BMET Recent Labs    10/04/19 0828 10/05/19 1855 10/06/19 0021  NA 140 140 139  K 4.7 6.2* 5.3*  CL 97* 102 101  CO2 26 23 23   GLUCOSE 137* 116* 194*  BUN 51* 89* 93*  CREATININE 8.87* 10.78* 11.41*  CALCIUM 8.3* 8.0* 7.7*   LFT Recent Labs    10/05/19 1855  PROT 6.7  ALBUMIN 3.2*  AST 15  ALT 14  ALKPHOS 83  BILITOT 0.6   Studies/Results: Dg Lumbar Spine Complete  Result Date: 10/04/2019 CLINICAL DATA:  Low back pain  after a fall this morning. EXAM: LUMBAR SPINE - COMPLETE 4+ VIEW COMPARISON:  None. FINDINGS: Vertebral body height and alignment are maintained. There is loss of disc space height and facet degenerative disease at L4-5 and L5-S1. Paraspinous structures demonstrate extensive atherosclerotic vascular disease. The descending abdominal aorta measures 3.7 cm. IMPRESSION: No acute abnormality. Lower lumbar degenerative disease. Atherosclerosis with a 3.7 cm abdominal aortic aneurysm. Recommend followup by ultrasound in 2 years. This recommendation follows ACR consensus guidelines: White Paper of the ACR Incidental Findings Committee II on Vascular Findings. J Am Coll Radiol 2013; 10:789-794. Electronically Signed   By: Inge Rise M.D.   On: 10/04/2019 09:52   Dg Chest Portable 1 View  Result Date: 10/04/2019 CLINICAL DATA:  Shortness of breath today with fall and low back pain. EXAM: PORTABLE CHEST 1 VIEW COMPARISON:  08/25/2019 FINDINGS: Right IJ dialysis catheter unchanged. Lungs are adequately inflated without lobar consolidation or effusion. Stable cardiomegaly. Remainder of the exam is unchanged. IMPRESSION: No acute cardiopulmonary disease. Stable cardiomegaly. Electronically Signed   By: Marin Olp M.D.   On: 10/04/2019 10:08    IMPRESSION:  *Acute on chronic anemia with heme positive stools.  Hgb 7.0 grams today on admission.  Receiving one unit PRBC's.  Progressive/advanced renal disease also likely contributing to anemia, however.  EGD in August with no source of bleeding.  History of non-bleeding colon AVM in 2017. *ESRD, recently started on HD:  Missed dialysis on Tuesday.  Cr 11 today.  In HD now.  PLAN: -Will plan for EGD/enteroscopy and colonoscopy on 10/9 with Dr. Lyndel Safe to rule out AVM's, etc.  Clear liquids today they NPO after midnight except bowel prep. -Monitor Hgb and transfuse further prn.   Laban Emperor. Zehr  10/06/2019, 9:09 AM    Attending physician's note   I have  taken an interval history, reviewed the chart and examined the patient. I agree with the Advanced Practitioner's note, impression and recommendations.   Acute on chronic anemia with heme positive stools. Neg EGD 07/2019 except for mod HH. H/O colonic AVMs 2017. ESRD on HD (had HD today) Associated CHF (EF 40-45%), CAD s/p CABG, CVA, DM, HTN, obesity, PVD  Plan: -Trend CBC. Keep Hb>8 -Enteroscopy/colon in AM.  -Avoid NSAIDs. -CTA if above is neg (She is already on HD).  Carmell Austria, MD [facility] [phone].

## 2019-10-06 NOTE — Procedures (Signed)
Patient seen and examined on Hemodialysis. BP (!) (P) 122/59   Pulse (P) 72   Temp (P) 98.3 F (36.8 C) (Oral)   Resp (P) 20   Wt 123.1 kg   SpO2 (P) 100%   BMI 41.27 kg/m   QB 400 mL/ min via TDC  UF goal 1.5L  Tolerating treatment without complaints at this time.   Madelon Lips MD [facility] pgr [phone] 10:06 AM

## 2019-10-06 NOTE — ED Notes (Signed)
Pre-3am labs were not drawn by by previous RN. Will draw lab post transfusion.

## 2019-10-06 NOTE — ED Notes (Signed)
[facility] at bedside 

## 2019-10-06 NOTE — H&P (View-Only) (Signed)
Referring Provider:  Dr. Tyrone Nine, [place] Primary Care Physician:  [place] Primary Gastroenterologist:  Dr. Carlean Purl  Reason for Consultation:  Anemia and melena  HPI: [name] [name] is a 68 y.o. female with medical history significant of CAD status post CABG, end-stage renal disease on dialysis Tuesday/Thursday/Saturday, diastolic and systolic congestive heart failure, CVA, history of hemoccult stool and anemia, type 2 diabetes, parathyroidism.  She missed dialysis on Tuesday and Cr today is 11.  Transferred to [facility] to receive HD.  Apparently admitted for dizziness.  Hgb was 7.0 grams today and was 7.8 grams yesterday and 9.3 grams one month ago.  Received one unit PRBC's.  She was heme positive so GI was consulted.  She was admitted for similar issues in August and GI was consulted.  She underwent EGD at that time that showed the following:  EGD 07/2019 by Dr. Henrene Pastor for anemia and heme positive stools showed the following:  1. Esophageal stricture with mild esophagitis. 2. Hiatal hernia without obvious Cameron erosions 3. Retained gastric contents consistent with diabetic gastroparesis 4. Otherwise normal exam with no evidence of active bleeding 5. Multifactorial anemia 6. History of isolated nonbleeding AVM on prior colonoscopy 7. Progressive renal failure with current BUN 132 and creatinine 6.11  Due to progressive renal disease at that time she was not prepped for colonoscopy.  She is HD at the time of my visit.  Says that she feels good.  Says that her stools are dark and they pretty much has been that way for a while/stay that way.  Denies seeing any red blood in her stools.  No abdominal pain.  Denies NSAID use.  Bowels move well without issues.   Was evaluated for the same issue in 2017 as follows.  03/2016 EGD: For evaluation of IDA, FOBT positive.  Medium sized hiatal hernia, no Cameron's lesions.  Otherwise normal study. 03/2016 colonoscopy: solitary,  nonbleeding colonic AVM at mid ascending colon, not treated.   Past Medical History:  Diagnosis Date  . Abscess   . Acute blood loss anemia 08/17/2019  . Acute respiratory failure ([place]) 10/18/2014  . Acute respiratory failure with hypoxia and hypercapnia ([place]) 06/01/2019  . Anemia 08/2016  . Angiodysplasia of colon   . Arthritis of left shoulder region 03/23/2013  . Bleeding gastrointestinal   . Cardiomegaly 05/2019  . Chest pain 04/17/2016  . Chronic combined systolic and diastolic CHF (congestive heart failure) (HCC)    a. EF 40-45%, mild LVH, mid apicalanteroseptal and apical HK.  . CKD (chronic kidney disease), stage III   . Cocaine abuse ([place])    crack cocaine heavily until 2008 then sporadic use since then  . Coronary artery disease    a. 06/2012 NSTEMI/CABG x 3 (LIMA->LAD, VG->OM2, VG->LCX);  b. 04/2015 MV: EF<30%, mid ant, apicalanterior, apical infarct;  c. 04/2015 Cath: LM nl, LAD 90p, LCX 62m, OM1 min irregs, RCA mild dzs, LIMA->LAD nl w/ dist LAD dzs, VG->OM2 nl, VG->LCX nl-->Med Rx.  . CVA (cerebral infarction)    a. right internal capsule stroke in 12/2006  . Demand ischemia ([place])   . Diabetes mellitus    diagnosed in 2008  . Elevated troponin 04/27/2019  . Essential hypertension   . Glaucoma   . Gout   . Heme positive stool   . HFrEF (heart failure with reduced ejection fraction) ([place])   . Hyperlipidemia   . Hyperparathyroidism, secondary renal ([place])   . Hypertensive crisis 06/02/2019  . Left-sided sensory  deficit present   . Lobar pneumonia ([place]) 04/27/2019  . Obesity, morbid ([place])   . Pneumonia   . Pulmonary edema 05/2019  . PVD (peripheral vascular disease) ([place])    a. 06/2012 ABI's: R - 0.73, L - 0.71.  Marland Kitchen Renal mass, right   . Sepsis ([place]) 04/27/2019  . Shortness of breath dyspnea   . Stroke ([place])   . Thrombocytosis ([place]) 04/17/2016  . Thyroid nodule    FNA in 6599 showed follicular cells but not definate neoplasm  . Tobacco abuse   . Trichomoniasis     Past  Surgical History:  Procedure Laterality Date  . AV FISTULA PLACEMENT Left 08/25/2019   Procedure: ARTERIOVENOUS (AV) BRACHIOCEPHALIC FISTULA CREATION;  Surgeon: Waynetta Sandy, MD;  Location: [facility];  Service: Vascular;  Laterality: Left;  . CARDIAC CATHETERIZATION    . CARDIAC CATHETERIZATION N/A 05/17/2015   Procedure: Left Heart Cath and Cors/Grafts Angiography;  Surgeon: [name], MD;  Location: [place] [facility] CV LAB;  Service: Cardiovascular;  Laterality: N/A;  . COLONOSCOPY WITH PROPOFOL N/A 04/21/2016   Procedure: COLONOSCOPY WITH PROPOFOL;  Surgeon: Irene Shipper, MD;  Location: [facility];  Service: Endoscopy;  Laterality: N/A;  . CORONARY ARTERY BYPASS GRAFT  07/09/2012   Procedure: CORONARY ARTERY BYPASS GRAFTING (CABG);  Surgeon: Ivin Poot, MD;  Location: [place] [facility];  Service: Open Heart Surgery;  Laterality: N/A;  . ESOPHAGOGASTRODUODENOSCOPY N/A 04/20/2016   Procedure: ESOPHAGOGASTRODUODENOSCOPY (EGD);  Surgeon: Gatha Mayer, MD;  Location: [facility] ENDOSCOPY;  Service: Endoscopy;  Laterality: N/A;  . ESOPHAGOGASTRODUODENOSCOPY (EGD) WITH PROPOFOL N/A 08/17/2019   Procedure: ESOPHAGOGASTRODUODENOSCOPY (EGD) WITH PROPOFOL;  Surgeon: Irene Shipper, MD;  Location: [facility] ENDOSCOPY;  Service: Endoscopy;  Laterality: N/A;  . INSERTION OF DIALYSIS CATHETER Right 08/25/2019   Procedure: INSERTION OF DIALYSIS CATHETER;  Surgeon: Waynetta Sandy, MD;  Location: [place];  Service: Vascular;  Laterality: Right;  . IR FLUORO GUIDE CV LINE RIGHT  08/19/2019  . IR US GUIDE VASC ACCESS RIGHT  08/19/2019  . LEFT HEART CATHETERIZATION WITH CORONARY ANGIOGRAM N/A 06/29/2012   Procedure: LEFT HEART CATHETERIZATION WITH CORONARY ANGIOGRAM;  Surgeon: Peter M Martinique, MD;  Location: [facility] CATH LAB;  Service: Cardiovascular;  Laterality: N/A;  . STERNAL WOUND DEBRIDEMENT  08/17/2012   Procedure: STERNAL WOUND DEBRIDEMENT;  Surgeon: Ivin Poot, MD;  Location[facility];  Service: Thoracic;  Laterality:  N/A;  wound vac application  . STERNAL WOUND DEBRIDEMENT  08/24/2012   Procedure: STERNAL WOUND DEBRIDEMENT;  Surgeon: Ivin Poot, MD;  Location: [place];  Service: Thoracic;  Laterality: N/A;  . STERNAL WOUND DEBRIDEMENT  09/01/2012   Procedure: STERNAL WOUND DEBRIDEMENT;  Surgeon: Ivin Poot, MD;  Location: [place];  Service: Thoracic;  Laterality: N/A;  . STERNAL WOUND DEBRIDEMENT  09/20/2012   Procedure: STERNAL WOUND DEBRIDEMENT;  Surgeon: Ivin Poot, MD;  Location: [facility] OR;  Service: Thoracic;  Laterality: N/A;  wound vac change    Prior to Admission medications   Medication Sig Start Date End Date Taking? Authorizing Provider  allopurinol (ZYLOPRIM) 100 MG tablet Take 100 mg by mouth 2 (two) times daily.  11/25/17  Yes [provider]  amLODipine (NORVASC) 5 MG tablet Take 1 tablet (5 mg total) by mouth daily. 04/30/19 04/29/20 Yes [name], MD  aspirin EC 325 MG tablet Take 325 mg by mouth daily. 05/13/19  Yes [provider]  brinzolamide (AZOPT) 1 % ophthalmic suspension Place 1 drop into both  eyes 2 (two) times a day.    Yes [provider]  buPROPion (WELLBUTRIN SR) 150 MG 12 hr tablet Take 1 tablet (150 mg total) by mouth 2 (two) times daily. 01/16/16  Yes Mikhail, Velta Addison, DO  cetirizine (ZYRTEC) 10 MG tablet Take 10 mg by mouth daily.    Yes [provider]  ferrous sulfate 325 (65 FE) MG tablet Take 325 mg by mouth 2 (two) times daily with a meal.    Yes [provider]  gabapentin (NEURONTIN) 300 MG capsule Take 1 capsule (300 mg total) by mouth daily. 08/26/19 10/05/19 Yes Jeanmarie Hubert, MD  insulin aspart (NOVOLOG) 100 UNIT/ML injection Inject 2 Units into the skin 3 (three) times daily. 08/27/19  Yes [provider]  insulin lispro (HUMALOG KWIKPEN) 100 UNIT/ML KwikPen Inject 0.02 mLs (2 Units total) into the skin 3 (three) times daily with meals. 08/26/19  Yes Jeanmarie Hubert, MD  ipratropium (ATROVENT HFA) 17 MCG/ACT  inhaler Inhale 2 puffs into the lungs every 6 (six) hours as needed for wheezing.   Yes [provider]  latanoprost (XALATAN) 0.005 % ophthalmic solution Place 1 drop into both eyes at bedtime.   Yes [provider]  LINZESS 72 MCG capsule Take 72 mcg by mouth daily before breakfast.  11/21/17  Yes [provider]  mometasone-formoterol (DULERA) 100-5 MCG/ACT AERO Inhale 2 puffs into the lungs every 4 (four) hours as needed for wheezing.   Yes [provider]  pantoprazole (PROTONIX) 40 MG tablet Take 1 tablet (40 mg total) by mouth daily. 08/26/19  Yes Jeanmarie Hubert, MD  polyethylene glycol (MIRALAX / GLYCOLAX) 17 g packet Take 17 g by mouth daily as needed for mild constipation.    Yes [provider]  PROAIR HFA 108 (90 Base) MCG/ACT inhaler Inhale 2 puffs into the lungs every 6 (six) hours as needed for wheezing.  11/17/17  Yes [provider]  Grant Ruts INHUB 250-50 MCG/DOSE AEPB Inhale 2 puffs into the lungs daily.  05/04/19  Yes [provider]  atorvastatin (LIPITOR) 40 MG tablet Take 1 tablet (40 mg total) by mouth daily at 6 PM. Patient not taking: Reported on 10/05/2019 06/03/19   Modena Jansky, MD  nitroGLYCERIN (NITROSTAT) 0.4 MG SL tablet Place 1 tablet (0.4 mg total) under the tongue every 5 (five) minutes as needed for chest pain. 04/30/19   [name], MD    Current Facility-Administered Medications  Medication Dose Route Frequency Provider Last Rate Last Dose  . 0.9 %  sodium chloride infusion (Manually program via Guardrails IV Fluids)   Intravenous Once Deno Etienne, DO   Stopped at 10/06/19 0700  . 0.9 %  sodium chloride infusion  100 mL Intravenous PRN Harrie Jeans C, MD      . 0.9 %  sodium chloride infusion  100 mL Intravenous PRN [name], MD      . 0.9 %  sodium chloride infusion  100 mL Intravenous PRN Edrick Oh, MD      . 0.9 %  sodium chloride infusion  100 mL Intravenous PRN Edrick Oh, MD      .  alteplase (CATHFLO ACTIVASE) injection 2 mg  2 mg Intracatheter Once PRN [name], MD      . alteplase (CATHFLO ACTIVASE) injection 2 mg  2 mg Intracatheter Once PRN Edrick Oh, MD      . buPROPion [facility] SR) 12 hr tablet 150 mg  150 mg Oral BID Tu, Ching T,  DO   150 mg at 10/06/19 0033  . Chlorhexidine Gluconate Cloth 2 % PADS 6 each  6 each Topical Q0600 [name], MD      . ferrous sulfate tablet 325 mg  325 mg Oral BID WC Tu, Ching T, DO      . gabapentin (NEURONTIN) capsule 300 mg  300 mg Oral Daily Tu, Ching T, DO      . heparin injection 1,000 Units  1,000 Units Dialysis PRN [name], MD      . heparin injection 1,000 Units  1,000 Units Dialysis PRN Edrick Oh, MD      . insulin aspart (novoLOG) injection 0-15 Units  0-15 Units Subcutaneous TID WC Tu, Ching T, DO      . ipratropium (ATROVENT) nebulizer solution 0.5 mg  0.5 mg Nebulization Q4H PRN Tu, Ching T, DO      . lidocaine (PF) (XYLOCAINE) 1 % injection 5 mL  5 mL Intradermal PRN [name], MD      . lidocaine (PF) (XYLOCAINE) 1 % injection 5 mL  5 mL Intradermal PRN Edrick Oh, MD      . lidocaine-prilocaine (EMLA) cream 1 application  1 application Topical PRN [name], MD      . lidocaine-prilocaine (EMLA) cream 1 application  1 application Topical PRN Edrick Oh, MD      . mometasone-formoterol (DULERA) 100-5 MCG/ACT inhaler 2 puff  2 puff Inhalation Q4H PRN Tu, Ching T, DO      . pentafluoroprop-tetrafluoroeth (GEBAUERS) aerosol 1 application  1 application Topical PRN [name], MD      . pentafluoroprop-tetrafluoroeth Landry Dyke) aerosol 1 application  1 application Topical PRN Edrick Oh, MD      . sodium zirconium cyclosilicate [facility]) packet 10 g  10 g Oral Q8H Floyd, Dan, DO   10 g at 10/05/19 2313   Current Outpatient Medications  Medication Sig Dispense Refill  . allopurinol (ZYLOPRIM) 100 MG tablet Take 100 mg by mouth 2 (two) times daily.     Marland Kitchen amLODipine (NORVASC) 5  MG tablet Take 1 tablet (5 mg total) by mouth daily. 30 tablet 11  . aspirin EC 325 MG tablet Take 325 mg by mouth daily.    . brinzolamide (AZOPT) 1 % ophthalmic suspension Place 1 drop into both eyes 2 (two) times a day.     Marland Kitchen buPROPion (WELLBUTRIN SR) 150 MG 12 hr tablet Take 1 tablet (150 mg total) by mouth 2 (two) times daily. 60 tablet 0  . cetirizine (ZYRTEC) 10 MG tablet Take 10 mg by mouth daily.     . ferrous sulfate 325 (65 FE) MG tablet Take 325 mg by mouth 2 (two) times daily with a meal.     . gabapentin (NEURONTIN) 300 MG capsule Take 1 capsule (300 mg total) by mouth daily. 30 capsule 0  . insulin aspart (NOVOLOG) 100 UNIT/ML injection Inject 2 Units into the skin 3 (three) times daily.    . insulin lispro (HUMALOG KWIKPEN) 100 UNIT/ML KwikPen Inject 0.02 mLs (2 Units total) into the skin 3 (three) times daily with meals. 3 mL 0  . ipratropium (ATROVENT HFA) 17 MCG/ACT inhaler Inhale 2 puffs into the lungs every 6 (six) hours as needed for wheezing.    . latanoprost (XALATAN) 0.005 % ophthalmic solution Place 1 drop into both eyes at bedtime.    Marland Kitchen LINZESS 72 MCG capsule Take 72 mcg by mouth daily before breakfast.   3  .  mometasone-formoterol (DULERA) 100-5 MCG/ACT AERO Inhale 2 puffs into the lungs every 4 (four) hours as needed for wheezing.    . pantoprazole (PROTONIX) 40 MG tablet Take 1 tablet (40 mg total) by mouth daily. 30 tablet 0  . polyethylene glycol (MIRALAX / GLYCOLAX) 17 g packet Take 17 g by mouth daily as needed for mild constipation.     Marland Kitchen PROAIR HFA 108 (90 Base) MCG/ACT inhaler Inhale 2 puffs into the lungs every 6 (six) hours as needed for wheezing.   3  . WIXELA INHUB 250-50 MCG/DOSE AEPB Inhale 2 puffs into the lungs daily.     Marland Kitchen atorvastatin (LIPITOR) 40 MG tablet Take 1 tablet (40 mg total) by mouth daily at 6 PM. (Patient not taking: Reported on 10/05/2019) 30 tablet 0  . nitroGLYCERIN (NITROSTAT) 0.4 MG SL tablet Place 1 tablet (0.4 mg total) under the  tongue every 5 (five) minutes as needed for chest pain. 30 tablet 0    Allergies as of 10/05/2019 - Review Complete 10/05/2019  Allergen Reaction Noted  . Naproxen Rash 09/21/2013    Family History  Problem Relation Age of Onset  . Diabetes Mother   . Hypertension Mother   . Cancer Mother   . Hyperlipidemia Father   . Hypertension Father   . Kidney disease Father   . Gout Father   . Cerebrovascular Accident Father   . Other Other        no known family CAD    Social History   Socioeconomic History  . Marital status: Single    Spouse name: Not on file  . Number of children: 0  . Years of education: Not on file  . Highest education level: Not on file  Occupational History  . Not on file  Social Needs  . Financial resource strain: Not on file  . Food insecurity    Worry: Not on file    Inability: Not on file  . Transportation needs    Medical: Not on file    Non-medical: Not on file  Tobacco Use  . Smoking status: Current Some Day Smoker    Packs/day: 0.25    Years: 50.00    Pack years: 12.50    Types: Cigarettes  . Smokeless tobacco: Never Used  Substance and Sexual Activity  . Alcohol use: No    Alcohol/week: 0.0 standard drinks  . Drug use: Yes    Types: Cocaine    Comment: pt denies at current time  . Sexual activity: Not Currently  Lifestyle  . Physical activity    Days per week: Not on file    Minutes per session: Not on file  . Stress: Not on file  Relationships  . Social Herbalist on phone: Not on file    Gets together: Not on file    Attends religious service: Not on file    Active member of club or organization: Not on file    Attends meetings of clubs or organizations: Not on file    Relationship status: Not on file  . Intimate partner violence    Fear of current or ex partner: Not on file    Emotionally abused: Not on file    Physically abused: Not on file    Forced sexual activity: Not on file  Other Topics Concern  . Not  on file  Social History Narrative   Lives with boyfriend of 20 years.     Review of Systems: ROS is O/W  negative except as mentioned in HPI.  Physical Exam: Vital signs in last 24 hours: Temp:  [98.3 F (36.8 C)-99.1 F (37.3 C)] 98.3 F (36.8 C) (10/08 0357) Pulse Rate:  [56-94] 76 (10/08 0810) Resp:  [13-30] 18 (10/08 0810) BP: (96-172)/(44-108) 156/74 (10/08 0810) SpO2:  [80 %-100 %] 99 % (10/08 0810) Weight:  [123.1 kg] 123.1 kg (10/08 0720)   General:  Alert, Well-developed, well-nourished, pleasant and cooperative in NAD Head:  Normocephalic and atraumatic. Eyes:  Sclera clear, no icterus.  Conjunctiva pink. Ears:  Normal auditory acuity. Mouth:  No deformity or lesions.   Lungs:  Clear throughout to auscultation.   No wheezes, crackles, or rhonchi.  Heart:  Regular rate and rhythm; no murmurs, clicks, rubs, or gallops. Abdomen:  Soft, obese, non-distended.  BS present.  Non-tender.   Rectal:  Deferred.  Heme positive by ED.  Msk:  Symmetrical without gross deformities. Pulses:  Normal pulses noted. Extremities:  Without clubbing or edema. Neurologic:  Alert and oriented x 4;  grossly normal neurologically. Skin:  Intact without significant lesions or rashes. Psych:  Alert and cooperative. Normal mood and affect.  Intake/Output from previous day: 10/07 0701 - 10/08 0700 In: 315 [Blood:315] Out: -   Lab Results: Recent Labs    10/04/19 0828 10/05/19 1855  WBC 10.9* 8.2  HGB 7.8* 7.0*  HCT 26.7* 23.9*  PLT 355 340   BMET Recent Labs    10/04/19 0828 10/05/19 1855 10/06/19 0021  NA 140 140 139  K 4.7 6.2* 5.3*  CL 97* 102 101  CO2 26 23 23   GLUCOSE 137* 116* 194*  BUN 51* 89* 93*  CREATININE 8.87* 10.78* 11.41*  CALCIUM 8.3* 8.0* 7.7*   LFT Recent Labs    10/05/19 1855  PROT 6.7  ALBUMIN 3.2*  AST 15  ALT 14  ALKPHOS 83  BILITOT 0.6   Studies/Results: Dg Lumbar Spine Complete  Result Date: 10/04/2019 CLINICAL DATA:  Low back pain  after a fall this morning. EXAM: LUMBAR SPINE - COMPLETE 4+ VIEW COMPARISON:  None. FINDINGS: Vertebral body height and alignment are maintained. There is loss of disc space height and facet degenerative disease at L4-5 and L5-S1. Paraspinous structures demonstrate extensive atherosclerotic vascular disease. The descending abdominal aorta measures 3.7 cm. IMPRESSION: No acute abnormality. Lower lumbar degenerative disease. Atherosclerosis with a 3.7 cm abdominal aortic aneurysm. Recommend followup by ultrasound in 2 years. This recommendation follows ACR consensus guidelines: White Paper of the ACR Incidental Findings Committee II on Vascular Findings. J Am Coll Radiol 2013; 10:789-794. Electronically Signed   By: Inge Rise M.D.   On: 10/04/2019 09:52   Dg Chest Portable 1 View  Result Date: 10/04/2019 CLINICAL DATA:  Shortness of breath today with fall and low back pain. EXAM: PORTABLE CHEST 1 VIEW COMPARISON:  08/25/2019 FINDINGS: Right IJ dialysis catheter unchanged. Lungs are adequately inflated without lobar consolidation or effusion. Stable cardiomegaly. Remainder of the exam is unchanged. IMPRESSION: No acute cardiopulmonary disease. Stable cardiomegaly. Electronically Signed   By: Marin Olp M.D.   On: 10/04/2019 10:08    IMPRESSION:  *Acute on chronic anemia with heme positive stools.  Hgb 7.0 grams today on admission.  Receiving one unit PRBC's.  Progressive/advanced renal disease also likely contributing to anemia, however.  EGD in August with no source of bleeding.  History of non-bleeding colon AVM in 2017. *ESRD, recently started on HD:  Missed dialysis on Tuesday.  Cr 11 today.  In HD now.  PLAN: -Will plan for EGD/enteroscopy and colonoscopy on 10/9 with Dr. Lyndel Safe to rule out AVM's, etc.  Clear liquids today they NPO after midnight except bowel prep. -Monitor Hgb and transfuse further prn.   Laban Emperor. Zehr  10/06/2019, 9:09 AM    Attending physician's note   I have  taken an interval history, reviewed the chart and examined the patient. I agree with the Advanced Practitioner's note, impression and recommendations.   Acute on chronic anemia with heme positive stools. Neg EGD 07/2019 except for mod HH. H/O colonic AVMs 2017. ESRD on HD (had HD today) Associated CHF (EF 40-45%), CAD s/p CABG, CVA, DM, HTN, obesity, PVD  Plan: -Trend CBC. Keep Hb>8 -Enteroscopy/colon in AM.  -Avoid NSAIDs. -CTA if above is neg (She is already on HD).  Carmell Austria, MD [facility] [phone].

## 2019-10-06 NOTE — Progress Notes (Signed)
PROGRESS NOTE    [name] [name]  HMC:[id number] DOB: 05-Jul-1951 DOA: 10/05/2019 PCP: [facility]   Brief Narrative: 68 y.o. female with medical history significant of CAD status post CABG, end-stage renal disease on dialysis T/Th/Sat, mixed diastolic/systolic congestive heart failure, CVA, history of hemoccult stool and anemia, type 2 diabetes who presented s/p dizziness leading to a fall.  Regarding falls, she states that she has been feeling dizzy and feeling unsteady even with her cane when ambulating, and had fallen three times in past three days. Per ED documentation, patient was seen at [facility] yesterday (10/6) for fall without any acute findings.  Orthostatic vital signs were normal at that time.  Hemoglobin was 7.8, but on presentation today (10/7) Hbg is down to 7 (was 9.3 one month ago).  Fecal occult blood positive and black tarry stool noted by EDP.  Regarding her anemia, she states she has had 3 days of dark stools with no bright red blood per rectum.  She also reports that she has missed dialysis for this entire week.  She denies any chest pain, palpitations shortness of breath.  Denies any nausea, vomiting, abdominal pain.  Denies use of NSAID's.  In the ED, pt noted to desaturate to low 80's with ambulation, required 3 L/min O2. EDP discussed case with gastroenterologist who recommended IV PPI and NPO past midnight.  Nephrology also consulted and recommended 2 doses of Lokelma and an amp of bicarb. Nephrology reportedly felt reasonable to give her a unit of blood although cautioned increase potassium.  Patient was transferred to [facility] and underwent dialysis today, 10/8.  GI is following, indicate similar issues in August 2020.  Had EGD at that time that did not identify any evidence of active bleeding.  Also has history of nonbleeding AVM's noted on prior colonoscopy in 2017.    Assessment & Plan:   Principal Problem:   Acute GI bleeding Active  Problems:   Type 2 diabetes mellitus with chronic kidney disease on chronic dialysis, with long-term current use of insulin (HCC)   Chronic combined systolic and diastolic CHF (congestive heart failure) (HCC)   ESRD (end stage renal disease) (HCC)   Iron deficiency anemia   Hyperlipidemia   History of CVA (cerebrovascular accident)   Essential hypertension   Coronary artery disease   S/P CABG (coronary artery bypass graft)   Depression  Acute GI Bleeding: source to be determined.  Hemodynamically stable at this time.  Heme positive stool and dark tarry stool noted by EDP.  Patient is followed by GI, has history of iron deficiency anemia and heme positive stool.  Per GI outpatient documentation, patient had plans for repair of her AVM but unsure whether patient actually followed up.   - GI following - Clear liquids then NPO after midnight except for bowel prep - GI plans for EGD and colonoscopy tomorrow, 10/9 with Dr. Lyndel Safe - continue IV PPI - continue iron when taking PO  Acute on Chronic Iron Deficiency Anemia: secondary to above.  Hbg 7.0 from 9.3 one month ago, in setting of above.  S/p 1 unit pRBC's.  - monitor H/H q4h - will transfuse as needed for Hbg <= 7 and/or active rapid bleeding or hemodynamic compromise   Hyperkalemia: in setting of end-stage renal disease: recived Lokelma x2 and 1 amp bicarb in ED.  No ECK changes noted.  Patient had missed a week of dialysis prior to presentation.  Underwent dialysis today, 10/8.   -  monitor BMP daily  End Stage Renal Disease: on hemodialysis (T/Th/Sat): underwent dialysis today  Recurrent falls: suspect secondary to acute anemia and possibly metabolic derangements from missed dialysis.  Pt uses cane for ambulation at baseline. -orthostatic normal in ED visit 10/6 -repeat orthostatic vitals - PT/OT eval  CAD status post CABG: chronic, stable.  -  Aspirin on hold due to GI bleed - statin on hold while NPO  History of diastolic and  systolic congestive heart failure: chronic, stable  Hypertension: chronic -Amlodipine held for soft blood pressures   hX CVA:  -Hold aspirin secondary to GI bleed  Type 2 diabetes with peripheral neuropathy -Normally takes 2 units 3 times daily with meals -Low-dose sliding scale insulin -Continue gabapentin when able to take p.o.  Depression -Continue Wellbutrin  COPD: chronic, not acutely exacerbated - continue Dulera, Atrovent   DVT prophylaxis: SCD's Code Status:  Full code Family Communication: none at bedside Disposition Plan: home once cleared by GI   Consultants:   Gastroenterology   Procedures:   none  Antimicrobials:  none    Subjective: Patient seen and examined at bedside on the floor.  States she is feeling well.  Reports not seeing any blood in stool since Friday, has had normal bowel movements.  Has not had any associated GI complaints including abdominal pain, nausea, vomiting or diarrhea. Complains of being hungry and asks to eat.  No acute events reported overnight.  Objective: Vitals:   10/06/19 1130 10/06/19 1200 10/06/19 1224 10/06/19 1435  BP: 109/75 137/83 (!) 122/56 (!) 102/47  Pulse: 88 84 86 62  Resp: 18 17 (!) 21 20  Temp:   97.8 F (36.6 C) 99.9 F (37.7 C)  TempSrc:    Oral  SpO2:   96% 92%  Weight:        Intake/Output Summary (Last 24 hours) at 10/06/2019 1555 Last data filed at 10/06/2019 1224 Gross per 24 hour  Intake 315 ml  Output 1000 ml  Net -685 ml   Filed Weights   10/06/19 0720  Weight: 123.1 kg    Examination:  General exam: Appears calm and comfortable, no distress  Respiratory system: Clear to auscultation. Respiratory effort normal. Cardiovascular system: S1 & S2 heard, RRR. No JVD, murmurs, rubs, gallops or clicks. No pedal edema. Gastrointestinal system: Abdomen is nondistended, soft and nontender. No organomegaly or masses felt. Normal bowel sounds heard. Central nervous system: Alert and  oriented. No focal neurological deficits. Extremities: Symmetric 5 x 5 power. Psychiatry: Judgement and insight appear normal. Mood & affect appropriate.     Data Reviewed: I have personally reviewed following labs and imaging studies  CBC: Recent Labs  Lab 10/04/19 0828 10/05/19 1855  WBC 10.9* 8.2  NEUTROABS  --  5.5  HGB 7.8* 7.0*  HCT 26.7* 23.9*  MCV 94.3 94.8  PLT 355 509   Basic Metabolic Panel: Recent Labs  Lab 10/04/19 0828 10/05/19 1855 10/06/19 0021  NA 140 140 139  K 4.7 6.2* 5.3*  CL 97* 102 101  CO2 '26 23 23  ' GLUCOSE 137* 116* 194*  BUN 51* 89* 93*  CREATININE 8.87* 10.78* 11.41*  CALCIUM 8.3* 8.0* 7.7*   GFR: Estimated Creatinine Clearance: 6.5 mL/min (A) (by C-G formula based on SCr of 11.41 mg/dL (H)). Liver Function Tests: Recent Labs  Lab 10/05/19 1855  AST 15  ALT 14  ALKPHOS 83  BILITOT 0.6  PROT 6.7  ALBUMIN 3.2*   Recent Labs  Lab 10/05/19 1855  LIPASE  35   No results for input(s): AMMONIA in the last 168 hours. Coagulation Profile: No results for input(s): INR, PROTIME in the last 168 hours. Cardiac Enzymes: No results for input(s): CKTOTAL, CKMB, CKMBINDEX, TROPONINI in the last 168 hours. BNP (last 3 results) No results for input(s): PROBNP in the last 8760 hours. HbA1C: No results for input(s): HGBA1C in the last 72 hours. CBG: Recent Labs  Lab 10/04/19 1137 10/06/19 0802 10/06/19 1437  GLUCAP 139* 207* 104*   Lipid Profile: No results for input(s): CHOL, HDL, LDLCALC, TRIG, CHOLHDL, LDLDIRECT in the last 72 hours. Thyroid Function Tests: No results for input(s): TSH, T4TOTAL, FREET4, T3FREE, THYROIDAB in the last 72 hours. Anemia Panel: Recent Labs    10/06/19 0021  VITAMINB12 290  FERRITIN 133  TIBC 235*  IRON 27*   Sepsis Labs: No results for input(s): PROCALCITON, LATICACIDVEN in the last 168 hours.  Recent Results (from the past 240 hour(s))  SARS Coronavirus 2 [facility] order, Performed in [facility], [facility], [facility] hospital lab) Nasopharyngeal Nasopharyngeal Swab     Status: None   Collection Time: 10/05/19  8:46 PM   Specimen: Nasopharyngeal Swab  Result Value Ref Range Status   SARS Coronavirus 2 NEGATIVE NEGATIVE Final    Comment: (NOTE) If result is NEGATIVE SARS-CoV-2 target nucleic acids are NOT DETECTED. The SARS-CoV-2 RNA is generally detectable in upper and lower  respiratory specimens during the acute phase of infection. The lowest  concentration of SARS-CoV-2 viral copies this assay can detect is 250  copies / mL. A negative result does not preclude SARS-CoV-2 infection  and should not be used as the sole basis for treatment or other  patient management decisions.  A negative result may occur with  improper specimen collection / handling, submission of specimen other  than nasopharyngeal swab, presence of viral mutation(s) within the  areas targeted by this assay, and inadequate number of viral copies  (<250 copies / mL). A negative result must be combined with clinical  observations, patient history, and epidemiological information. If result is POSITIVE SARS-CoV-2 target nucleic acids are DETECTED. The SARS-CoV-2 RNA is generally detectable in upper and lower  respiratory specimens dur ing the acute phase of infection.  Positive  results are indicative of active infection with SARS-CoV-2.  Clinical  correlation with patient history and other diagnostic information is  necessary to determine patient infection status.  Positive results do  not rule out bacterial infection or co-infection with other viruses. If result is PRESUMPTIVE POSTIVE SARS-CoV-2 nucleic acids MAY BE PRESENT.   A presumptive positive result was obtained on the submitted specimen  and confirmed on repeat testing.  While 2019 novel coronavirus  (SARS-CoV-2) nucleic acids may be present in the submitted sample  additional confirmatory testing may be necessary for epidemiological  and / or clinical management  purposes  to differentiate between  SARS-CoV-2 and other Sarbecovirus currently known to infect humans.  If clinically indicated additional testing with an alternate test  methodology [phone]) is advised. The SARS-CoV-2 RNA is generally  detectable in upper and lower respiratory sp ecimens during the acute  phase of infection. The expected result is Negative. Fact Sheet for Patients:  StrictlyIdeas.no Fact Sheet for Healthcare Providers: BankingDealers.co.za This test is not yet approved or cleared by the [place] FDA and has been authorized for detection and/or diagnosis of SARS-CoV-2 by FDA under an Emergency Use Authorization (EUA).  This EUA will remain in effect (meaning this test can be used) for the duration  of the COVID-19 declaration under Section 564(b)(1) of the Act, 21 U.S.C. section 360bbb-3(b)(1), unless the authorization is terminated or revoked sooner. Performed at [facility], [place] [address] [place], [place] [address]          Radiology Studies: No results found.      Scheduled Meds: . sodium chloride   Intravenous Once  . buPROPion  150 mg Oral BID  . Chlorhexidine Gluconate Cloth  6 each Topical Q0600  . Darbepoetin Alfa      . darbepoetin (ARANESP) injection - DIALYSIS  100 mcg Intravenous Q Thu-HD  . ferrous sulfate  325 mg Oral BID WC  . gabapentin  300 mg Oral Daily  . heparin      . insulin aspart  0-15 Units Subcutaneous TID WC  . peg 3350 powder  1 kit Oral Once   Continuous Infusions:   LOS: 1 day    Time spent: 40-45 min    Ezekiel Slocumb, MD [facility] Pager [phone]  If 7PM-7AM, please contact night-coverage [email or web address] Password [email or web address] 10/06/2019, 3:55 PM

## 2019-10-06 NOTE — Consult Note (Signed)
Reason for Consult: To manage dialysis and dialysis related needs Referring Physician: Dr. Teofilo Pod [name] [name] is an 68 y.o. female.   HPI: Pt is a 27F with ESRD on HD, HTN, HLD, CAD s/p CABG, combined systolic and diastolic CHF, h/o crack abuse, h/o CVA, and h/o GI bleed with no clear source found who is now seen in consultation at the request of Dr. Sloan Leiter for eval and mangement of ESRD and provision of HD.  Pt presented to [facility] ED via EMS.  for dizziness, 3 falls in 3 days, and LUQ pain.  Pain was noted to be crampy/ colicky and resolving with BM.  In ED, she was found to have Hgb down to 7.0, K 6.2, Iron deficiency.  Got 1 u pRBCs and was transferred to [facility] for HD.  Heme + stool.  In this setting we are asked to see.    Pt has recently started on HD 07/2019 at [facility],  Using a [facility] and has a maturing AVF.  Pt notes no complaints right now except she is frustrated that [facility]    Dialysis orders: [facility] TTS 4 hr 15 min EDW 121 kg F 180 dialyzer 2K/ 2 Ca bath BFR 400 via TDC, maturing AVF No profile   Mircera 100 q 2 weeks, to start 10/14, micera 30 given 10/1 Calcitriol 0.25 mcg q rx No heparin No binders listed  Past Medical History:  Diagnosis Date  . Abscess   . Acute blood loss anemia 08/17/2019  . Acute respiratory failure ([place]) 10/18/2014  . Acute respiratory failure with hypoxia and hypercapnia ([place]) 06/01/2019  . Anemia 08/2016  . Angiodysplasia of colon   . Arthritis of left shoulder region 03/23/2013  . Bleeding gastrointestinal   . Cardiomegaly 05/2019  . Chest pain 04/17/2016  . Chronic combined systolic and diastolic CHF (congestive heart failure) (HCC)    a. EF 40-45%, mild LVH, mid apicalanteroseptal and apical HK.  . CKD (chronic kidney disease), stage III   . Cocaine abuse ([place])    crack cocaine heavily until 2008 then sporadic use since then  . Coronary artery disease    a. 06/2012 NSTEMI/CABG x 3 (LIMA->LAD, VG->OM2, VG->LCX);  b.  04/2015 MV: EF<30%, mid ant, apicalanterior, apical infarct;  c. 04/2015 Cath: LM nl, LAD 90p, LCX 55m, OM1 min irregs, RCA mild dzs, LIMA->LAD nl w/ dist LAD dzs, VG->OM2 nl, VG->LCX nl-->Med Rx.  . CVA (cerebral infarction)    a. right internal capsule stroke in 12/2006  . Demand ischemia ([place])   . Diabetes mellitus    diagnosed in 2008  . Elevated troponin 04/27/2019  . Essential hypertension   . Glaucoma   . Gout   . Heme positive stool   . HFrEF (heart failure with reduced ejection fraction) ([place])   . Hyperlipidemia   . Hyperparathyroidism, secondary renal ([place])   . Hypertensive crisis 06/02/2019  . Left-sided sensory deficit present   . Lobar pneumonia ([place]) 04/27/2019  . Obesity, morbid ([place])   . Pneumonia   . Pulmonary edema 05/2019  . PVD (peripheral vascular disease) ([place])    a. 06/2012 ABI's: R - 0.73, L - 0.71.  Marland Kitchen Renal mass, right   . Sepsis ([place]) 04/27/2019  . Shortness of breath dyspnea   . Stroke ([place])   . Thrombocytosis ([place]) 04/17/2016  . Thyroid nodule    FNA in 2800 showed follicular cells but not definate neoplasm  . Tobacco abuse   .  Trichomoniasis     Past Surgical History:  Procedure Laterality Date  . AV FISTULA PLACEMENT Left 08/25/2019   Procedure: ARTERIOVENOUS (AV) BRACHIOCEPHALIC FISTULA CREATION;  Surgeon: Waynetta Sandy, MD;  Location: [place];  Service: Vascular;  Laterality: Left;  . CARDIAC CATHETERIZATION    . CARDIAC CATHETERIZATION N/A 05/17/2015   Procedure: Left Heart Cath and Cors/Grafts Angiography;  Surgeon: [name], MD;  Location: [place] [facility];  Service: Cardiovascular;  Laterality: N/A;  . COLONOSCOPY WITH PROPOFOL N/A 04/21/2016   Procedure: COLONOSCOPY WITH PROPOFOL;  Surgeon: Irene Shipper, MD;  Location: [place];  Service: Endoscopy;  Laterality: N/A;  . CORONARY ARTERY BYPASS GRAFT  07/09/2012   Procedure: CORONARY ARTERY BYPASS GRAFTING (CABG);  Surgeon: Ivin Poot, MD;  Location: [place];  Service: Open  Heart Surgery;  Laterality: N/A;  . ESOPHAGOGASTRODUODENOSCOPY N/A 04/20/2016   Procedure: ESOPHAGOGASTRODUODENOSCOPY (EGD);  Surgeon: Gatha Mayer, MD;  Location: [facility] ENDOSCOPY;  Service: Endoscopy;  Laterality: N/A;  . ESOPHAGOGASTRODUODENOSCOPY (EGD) WITH PROPOFOL N/A 08/17/2019   Procedure: ESOPHAGOGASTRODUODENOSCOPY (EGD) WITH PROPOFOL;  Surgeon: Irene Shipper, MD;  Location: [facility] ENDOSCOPY;  Service: Endoscopy;  Laterality: N/A;  . INSERTION OF DIALYSIS CATHETER Right 08/25/2019   Procedure: INSERTION OF DIALYSIS CATHETER;  Surgeon: Waynetta Sandy, MD;  Location: [place];  Service: Vascular;  Laterality: Right;  . IR FLUORO GUIDE CV LINE RIGHT  08/19/2019  . IR US GUIDE VASC ACCESS RIGHT  08/19/2019  . LEFT HEART CATHETERIZATION WITH CORONARY ANGIOGRAM N/A 06/29/2012   Procedure: LEFT HEART CATHETERIZATION WITH CORONARY ANGIOGRAM;  Surgeon: Peter M Martinique, MD;  Location: [facility] CATH LAB;  Service: Cardiovascular;  Laterality: N/A;  . STERNAL WOUND DEBRIDEMENT  08/17/2012   Procedure: STERNAL WOUND DEBRIDEMENT;  Surgeon: Ivin Poot, MD;  Location: [facility] OR;  Service: Thoracic;  Laterality: N/A;  wound vac application  . STERNAL WOUND DEBRIDEMENT  08/24/2012   Procedure: STERNAL WOUND DEBRIDEMENT;  Surgeon: Ivin Poot, MD;  Location: [place];  Service: Thoracic;  Laterality: N/A;  . STERNAL WOUND DEBRIDEMENT  09/01/2012   Procedure: STERNAL WOUND DEBRIDEMENT;  Surgeon: Ivin Poot, MD;  Location: [place];  Service: Thoracic;  Laterality: N/A;  . STERNAL WOUND DEBRIDEMENT  09/20/2012   Procedure: STERNAL WOUND DEBRIDEMENT;  Surgeon: Ivin Poot, MD;  Location[facility] OR;  Service: Thoracic;  Laterality: N/A;  wound vac change    Family History  Problem Relation Age of Onset  . Diabetes Mother   . Hypertension Mother   . Cancer Mother   . Hyperlipidemia Father   . Hypertension Father   . Kidney disease Father   . Gout Father   . Cerebrovascular Accident Father   . Other Other         no known family CAD    Social History:  reports that she has been smoking cigarettes. She has a 12.50 pack-year smoking history. She has never used smokeless tobacco. She reports current drug use. Drug: Cocaine. She reports that she does not drink alcohol.  Allergies:  Allergies  Allergen Reactions  . Naproxen Rash    Medications: Prior to Admission: (Not in a hospital admission)   Results for orders placed or performed during the hospital encounter of 10/05/19 (from the past 48 hour(s))  CBC with Differential     Status: Abnormal   Collection Time: 10/05/19  6:55 PM  Result Value Ref Range   WBC 8.2 4.0 - 10.5 K/uL   RBC 2.52 (  L) 3.87 - 5.11 MIL/uL   Hemoglobin 7.0 (L) 12.0 - 15.0 g/dL   HCT 23.9 (L) 36.0 - 46.0 %   MCV 94.8 80.0 - 100.0 fL   MCH 27.8 26.0 - 34.0 pg   MCHC 29.3 (L) 30.0 - 36.0 g/dL   RDW 20.2 (H) 11.5 - 15.5 %   Platelets 340 150 - 400 K/uL   nRBC 0.0 0.0 - 0.2 %   Neutrophils Relative % 66 %   Neutro Abs 5.5 1.7 - 7.7 K/uL   Lymphocytes Relative 21 %   Lymphs Abs 1.7 0.7 - 4.0 K/uL   Monocytes Relative 10 %   Monocytes Absolute 0.8 0.1 - 1.0 K/uL   Eosinophils Relative 1 %   Eosinophils Absolute 0.1 0.0 - 0.5 K/uL   Basophils Relative 1 %   Basophils Absolute 0.1 0.0 - 0.1 K/uL   Immature Granulocytes 1 %   Abs Immature Granulocytes 0.04 0.00 - 0.07 K/uL    Comment: Performed at [facility], [place] [address] [place], [place] [address]  Comprehensive metabolic panel     Status: Abnormal   Collection Time: 10/05/19  6:55 PM  Result Value Ref Range   Sodium 140 135 - 145 mmol/L   Potassium 6.2 (H) 3.5 - 5.1 mmol/L   Chloride 102 98 - 111 mmol/L   CO2 23 22 - 32 mmol/L   Glucose, Bld 116 (H) 70 - 99 mg/dL   BUN 89 (H) 8 - 23 mg/dL   Creatinine, Ser 10.78 (H) 0.44 - 1.00 mg/dL   Calcium 8.0 (L) 8.9 - 10.3 mg/dL   Total Protein 6.7 6.5 - 8.1 g/dL   Albumin 3.2 (L) 3.5 - 5.0 g/dL   AST 15 15 - 41 U/L   ALT 14 0 - 44 U/L   Alkaline  Phosphatase 83 38 - 126 U/L   Total Bilirubin 0.6 0.3 - 1.2 mg/dL   GFR calc non Af Amer 3 (L) >60 mL/min   GFR calc Af Amer 4 (L) >60 mL/min   Anion gap 15 5 - 15    Comment: Performed at [facility], [place] [address] [place], [place] [address]  Lipase, blood     Status: None   Collection Time: 10/05/19  6:55 PM  Result Value Ref Range   Lipase 35 11 - 51 U/L    Comment: Performed at [facility], [place] [address] [place], [place] [address]  POC occult blood, ED Provider will collect     Status: Abnormal   Collection Time: 10/05/19  8:06 PM  Result Value Ref Range   Fecal Occult Bld POSITIVE (A) NEGATIVE  SARS Coronavirus 2 [facility] order, Performed in [facility] hospital lab) Nasopharyngeal Nasopharyngeal Swab     Status: None   Collection Time: 10/05/19  8:46 PM   Specimen: Nasopharyngeal Swab  Result Value Ref Range   SARS Coronavirus 2 NEGATIVE NEGATIVE    Comment: (NOTE) If result is NEGATIVE SARS-CoV-2 target nucleic acids are NOT DETECTED. The SARS-CoV-2 RNA is generally detectable in upper and lower  respiratory specimens during the acute phase of infection. The lowest  concentration of SARS-CoV-2 viral copies this assay can detect is 250  copies / mL. A negative result does not preclude SARS-CoV-2 infection  and should not be used as the sole basis for treatment or other  patient management decisions.  A negative result may occur with  improper specimen collection / handling, submission of specimen other  than nasopharyngeal swab, presence of viral mutation(s) within the  areas targeted by this assay, and inadequate number of viral copies  (<250 copies / mL). A negative result must be combined with clinical  observations, patient history, and epidemiological information. If result is POSITIVE SARS-CoV-2 target nucleic acids are DETECTED. The SARS-CoV-2 RNA is generally detectable in upper and lower  respiratory specimens dur ing  the acute phase of infection.  Positive  results are indicative of active infection with SARS-CoV-2.  Clinical  correlation with patient history and other diagnostic information is  necessary to determine patient infection status.  Positive results do  not rule out bacterial infection or co-infection with other viruses. If result is PRESUMPTIVE POSTIVE SARS-CoV-2 nucleic acids MAY BE PRESENT.   A presumptive positive result was obtained on the submitted specimen  and confirmed on repeat testing.  While 2019 novel coronavirus  (SARS-CoV-2) nucleic acids may be present in the submitted sample  additional confirmatory testing may be necessary for epidemiological  and / or clinical management purposes  to differentiate between  SARS-CoV-2 and other Sarbecovirus currently known to infect humans.  If clinically indicated additional testing with an alternate test  methodology [phone]) is advised. The SARS-CoV-2 RNA is generally  detectable in upper and lower respiratory sp ecimens during the acute  phase of infection. The expected result is Negative. Fact Sheet for Patients:  StrictlyIdeas.no Fact Sheet for Healthcare Providers: BankingDealers.co.za This test is not yet approved or cleared by the [place] FDA and has been authorized for detection and/or diagnosis of SARS-CoV-2 by FDA under an Emergency Use Authorization (EUA).  This EUA will remain in effect (meaning this test can be used) for the duration of the COVID-19 declaration under Section 564(b)(1) of the Act, 21 U.S.C. section 360bbb-3(b)(1), unless the authorization is terminated or revoked sooner. Performed at [facility], [place] [address], [place], [place] [address]   Basic metabolic panel     Status: Abnormal   Collection Time: 10/06/19 12:21 AM  Result Value Ref Range   Sodium 139 135 - 145 mmol/L   Potassium 5.3 (H) 3.5 - 5.1 mmol/L   Chloride 101 98 -  111 mmol/L   CO2 23 22 - 32 mmol/L   Glucose, Bld 194 (H) 70 - 99 mg/dL   BUN 93 (H) 8 - 23 mg/dL   Creatinine, Ser 11.41 (H) 0.44 - 1.00 mg/dL   Calcium 7.7 (L) 8.9 - 10.3 mg/dL   GFR calc non Af Amer 3 (L) >60 mL/min   GFR calc Af Amer 4 (L) >60 mL/min   Anion gap 15 5 - 15    Comment: Performed at [place] [address] [place], [place] [address]  Iron and TIBC     Status: Abnormal   Collection Time: 10/06/19 12:21 AM  Result Value Ref Range   Iron 27 (L) 28 - 170 ug/dL   TIBC 235 (L) 250 - 450 ug/dL   Saturation Ratios 12 10.4 - 31.8 %   UIBC 208 ug/dL    Comment: Performed at [facility], [place] [address] [place], [place] [address]  Ferritin     Status: None   Collection Time: 10/06/19 12:21 AM  Result Value Ref Range   Ferritin 133 11 - 307 ng/mL    Comment: Performed at [facility], [place] [address], [place], [place] [address]  Vitamin B12     Status: None   Collection Time: 10/06/19 12:21 AM  Result Value Ref Range   Vitamin B-12 290 180 - 914 pg/mL    Comment: (NOTE) This assay is not validated for testing neonatal or myeloproliferative syndrome specimens for Vitamin B12 levels. Performed at [facility], [place] [address], [place], [place] [address]   Type and screen [place]     Status: None (Preliminary result)   Collection Time: 10/06/19 12:21 AM  Result Value Ref Range   ABO/RH(D) O POS    Antibody Screen NEG    Sample Expiration 10/09/2019,2359    Unit Number [id number]    Blood Component Type RED CELLS,LR    Unit division 00    Status of Unit ISSUED    Transfusion Status OK TO TRANSFUSE    Crossmatch Result      Compatible Performed at [facility], [place] [address] [place], [place] [address]   ABO/Rh     Status: None   Collection Time: 10/06/19 12:21 AM  Result Value Ref Range   ABO/RH(D)      O POS Performed at [facility], [place] [address], [place], [place] [address]   Prepare RBC     Status: None   Collection Time: 10/06/19 12:23 AM  Result Value Ref Range   Order Confirmation      ORDER PROCESSED BY BLOOD BANK Performed at [facility], [place] [address] [place], [place] [address]   CBG monitoring, ED     Status: Abnormal   Collection Time: 10/06/19  8:02 AM  Result Value Ref Range   Glucose-Capillary 207 (H) 70 - 99 mg/dL    Dg Chest Portable 1 View  Result Date: 10/04/2019 CLINICAL DATA:  Shortness of breath today with fall and low back pain. EXAM: PORTABLE CHEST 1 VIEW COMPARISON:  08/25/2019 FINDINGS: Right IJ dialysis catheter unchanged. Lungs are adequately inflated without lobar consolidation or effusion. Stable cardiomegaly. Remainder of the exam is unchanged. IMPRESSION: No acute cardiopulmonary disease. Stable cardiomegaly. Electronically Signed   By: Marin Olp M.D.   On: 10/04/2019 10:08    ROS: all other systems reviewed and are negative except as per HPI Blood pressure (!) 156/74, pulse 76, temperature 98.3 F (36.8 C), temperature source Oral, resp. rate 18, weight 123.1 kg, SpO2 99 %. .  GEN older woman, sitting in bed, on HD HEENT wearing glasses, eomi perrl NECK no JVD PULM clear bilaterally no c/w/r CV RRR no m/r/g ABD soft, obese, mildly tender in LUQ EXT trace LE edema NEURO AAO x 3 SKIN no rashes MSK no effusions ACCESS TDC in place, dressing c/d/i, LUE AVF + T/B, maturing nicely  Assessment/Plan: 1 Weakness/ falls: perhaps related to blood loss.  Would rec orthostatics after transfusion / dialysis, imaging at discretion of primary team 2.  Heme + stool, with iron def anemia, s/p 1 u PRBCs, GI to see.   3 ESRD: TTS- provide HD on sched today 4 Hypertension: pressures soft, would hold antihypertensives 5. Anemia of ESRD: Hgb down to 7.0, got prbcs, will increase ESA to Aranesp 100 q week and follow 6. Metabolic Bone Disease: on  calcitriol 0.25 mcg q rx, no binders listed, follow phos and add as appropriate 7.  Dispo: pending  Madelon Lips 10/06/2019, 9:47 AM

## 2019-10-06 NOTE — ED Notes (Addendum)
Dain from hemodialysis called for an report on pt and requested that I call [facility] to transfer pt to hemodialysis.

## 2019-10-06 NOTE — ED Notes (Signed)
Pt in process of being transferred by [facility] from one stretcher to the other. Pt completed blood transfusion at 0700, re draw lab cannot be completed until 0800 per protocol.  Report given to HD nurse and [facility] regarding pt follow up CBC/labs to be redrawn upon arrival at [facility] facility.

## 2019-10-06 NOTE — Progress Notes (Signed)
PT Cancellation Note  Patient Details Name: [name] [name] MRN: [id number] DOB: 09-22-1951   Cancelled Treatment:    Reason Eval/Treat Not Completed: Patient at procedure or test/unavailable Pt currently at HD. Will follow up as schedule allows.   Leighton Ruff, PT, DPT  Acute Rehabilitation Services  Pager: [phone] Office: [phone]    [name] 10/06/2019, 10:19 AM

## 2019-10-07 ENCOUNTER — Encounter (HOSPITAL_COMMUNITY)
Admission: EM | Disposition: A | Discharge status: Skilled Nursing Facility | Payer: Self-pay | Source: Home / Self Care | Attending: Internal Medicine

## 2019-10-07 ENCOUNTER — Inpatient Hospital Stay (HOSPITAL_COMMUNITY): Payer: Medicare Other | Admitting: Certified Registered Nurse Anesthetist

## 2019-10-07 ENCOUNTER — Encounter (HOSPITAL_COMMUNITY): Payer: Self-pay

## 2019-10-07 DIAGNOSIS — D500 Iron deficiency anemia secondary to blood loss (chronic): Secondary | ICD-10-CM

## 2019-10-07 DIAGNOSIS — K449 Diaphragmatic hernia without obstruction or gangrene: Secondary | ICD-10-CM

## 2019-10-07 HISTORY — PX: ENTEROSCOPY: SHX5533

## 2019-10-07 HISTORY — PX: SUBMUCOSAL TATTOO INJECTION: SHX6856

## 2019-10-07 LAB — TYPE AND SCREEN
ABO/RH(D): O POS
Antibody Screen: NEGATIVE
Unit division: 0

## 2019-10-07 LAB — BASIC METABOLIC PANEL
Anion gap: 15 (ref 5–15)
BUN: 46 mg/dL — ABNORMAL HIGH (ref 8–23)
CO2: 25 mmol/L (ref 22–32)
Calcium: 8.2 mg/dL — ABNORMAL LOW (ref 8.9–10.3)
Chloride: 96 mmol/L — ABNORMAL LOW (ref 98–111)
Creatinine, Ser: 6.78 mg/dL — ABNORMAL HIGH (ref 0.44–1.00)
GFR calc Af Amer: 7 mL/min — ABNORMAL LOW (ref >60)
GFR calc non Af Amer: 6 mL/min — ABNORMAL LOW (ref >60)
Glucose, Bld: 142 mg/dL — ABNORMAL HIGH (ref 70–99)
Potassium: 4.1 mmol/L (ref 3.5–5.1)
Sodium: 136 mmol/L (ref 135–145)

## 2019-10-07 LAB — FOLATE RBC
Folate, Hemolysate: 210 ng/mL
Folate, RBC: 955 ng/mL (ref >498)
Hematocrit: 22 % — ABNORMAL LOW (ref 34.0–46.6)

## 2019-10-07 LAB — GLUCOSE, CAPILLARY
Glucose-Capillary: 77 mg/dL (ref 70–99)
Glucose-Capillary: 130 mg/dL — ABNORMAL HIGH (ref 70–99)
Glucose-Capillary: 103 mg/dL — ABNORMAL HIGH (ref 70–99)
Glucose-Capillary: 126 mg/dL — ABNORMAL HIGH (ref 70–99)

## 2019-10-07 LAB — BPAM RBC
Blood Product Expiration Date: 202011072359
ISSUE DATE / TIME: 202010080328
Unit Type and Rh: 5100

## 2019-10-07 LAB — MRSA PCR SCREENING: MRSA by PCR: NEGATIVE

## 2019-10-07 LAB — PHOSPHORUS: Phosphorus: 5.8 mg/dL — ABNORMAL HIGH (ref 2.5–4.6)

## 2019-10-07 SURGERY — ENTEROSCOPY
Anesthesia: Monitor Anesthesia Care

## 2019-10-07 MED ORDER — PROPOFOL 10 MG/ML IV BOLUS
INTRAVENOUS | Status: DC | PRN
  Administered 2019-10-07 (×2): 10 mg via INTRAVENOUS
  Administered 2019-10-07: 20 mg via INTRAVENOUS

## 2019-10-07 MED ORDER — PHENYLEPHRINE 40 MCG/ML (10ML) SYRINGE FOR IV PUSH (FOR BLOOD PRESSURE SUPPORT)
PREFILLED_SYRINGE | INTRAVENOUS | Status: DC | PRN
  Administered 2019-10-07: 80 ug via INTRAVENOUS

## 2019-10-07 MED ORDER — PANTOPRAZOLE SODIUM 40 MG PO TBEC
40.0000 mg | DELAYED_RELEASE_TABLET | Freq: Every day | ORAL | Status: DC
  Administered 2019-10-07 – 2019-10-10 (×4): 40 mg via ORAL
  Filled 2019-10-07 (×4): qty 1

## 2019-10-07 MED ORDER — PROPOFOL 500 MG/50ML IV EMUL
INTRAVENOUS | Status: DC | PRN
  Administered 2019-10-07: 75 ug/kg/min via INTRAVENOUS

## 2019-10-07 MED ORDER — GLUCAGON HCL RDNA (DIAGNOSTIC) 1 MG IJ SOLR
INTRAMUSCULAR | Status: AC
  Filled 2019-10-07: qty 1

## 2019-10-07 MED ORDER — SPOT INK MARKER SYRINGE KIT
PACK | SUBMUCOSAL | Status: AC
  Filled 2019-10-07: qty 5

## 2019-10-07 MED ORDER — LIDOCAINE 2% (20 MG/ML) 5 ML SYRINGE
INTRAMUSCULAR | Status: DC | PRN
  Administered 2019-10-07: 100 mg via INTRAVENOUS

## 2019-10-07 MED ORDER — GLUCAGON HCL RDNA (DIAGNOSTIC) 1 MG IJ SOLR
INTRAMUSCULAR | Status: DC | PRN
  Administered 2019-10-07 (×2): .2 mg via INTRAVENOUS

## 2019-10-07 MED ORDER — SPOT INK MARKER SYRINGE KIT
PACK | SUBMUCOSAL | Status: DC | PRN
  Administered 2019-10-07: 3 mL via SUBMUCOSAL

## 2019-10-07 MED ORDER — SODIUM CHLORIDE 0.9 % IV SOLN
INTRAVENOUS | Status: DC
  Administered 2019-10-07: 09:00:00 via INTRAVENOUS

## 2019-10-07 MED ORDER — HEPARIN SODIUM (PORCINE) 1000 UNIT/ML IJ SOLN
1500.0000 [IU] | Freq: Once | INTRAMUSCULAR | Status: AC
  Administered 2019-10-07: 1500 [IU] via INTRAVENOUS

## 2019-10-07 SURGICAL SUPPLY — 22 items

## 2019-10-07 NOTE — Anesthesia Preprocedure Evaluation (Addendum)
Anesthesia Evaluation  Patient identified by MRN, date of birth, ID band Patient awake    Reviewed: Allergy & Precautions, NPO status , Patient's Chart, lab work & pertinent test results  Airway Mallampati: III  TM Distance: >3 FB Neck ROM: Full    Dental  (+) Edentulous Upper, Edentulous Lower   Pulmonary Current SmokerPatient did not abstain from smoking.,    Pulmonary exam normal breath sounds clear to auscultation       Cardiovascular hypertension, Pt. on medications + CAD, + Past MI, + CABG, + Peripheral Vascular Disease and +CHF  Normal cardiovascular exam Rhythm:Regular Rate:Normal  ECG: SR, rate 63   Neuro/Psych PSYCHIATRIC DISORDERS Depression CVA, Residual Symptoms    GI/Hepatic hiatal hernia, (+)     substance abuse  ,   Endo/Other  diabetes, Insulin Dependent  Renal/GU ESRF and DialysisRenal disease     Musculoskeletal Ambulates with cane   Abdominal   Peds  Hematology HLD Gout   Anesthesia Other Findings Anemia and heme positive stool  Reproductive/Obstetrics                            Anesthesia Physical Anesthesia Plan  ASA: IV  Anesthesia Plan: MAC   Post-op Pain Management:    Induction:   PONV Risk Score and Plan: 1 and Propofol infusion and Treatment may vary due to age or medical condition  Airway Management Planned: Nasal Cannula  Additional Equipment:   Intra-op Plan:   Post-operative Plan:   Informed Consent: I have reviewed the patients History and Physical, chart, labs and discussed the procedure including the risks, benefits and alternatives for the proposed anesthesia with the patient or authorized representative who has indicated his/her understanding and acceptance.       Plan Discussed with: CRNA  Anesthesia Plan Comments:        Anesthesia Quick Evaluation

## 2019-10-07 NOTE — Evaluation (Signed)
Physical Therapy Evaluation Patient Details Name: [name] [name] MRN: [id number] DOB: February 06, 1951 Today's Date: 10/07/2019   History of Present Illness  Pt adm with GI bleed. Marland KitchenPMH to include DM, CAD s/p CABG, CKD, HF, CVA, ESRD on HD  Clinical Impression  Pt admitted with above diagnosis and presents to PT with functional limitations due to deficits listed below (See PT problem list). Pt needs skilled PT to maximize independence and safety to allow discharge to SNF. Pt is very weak and has to go up/down 2 flights of steps to get into home. Pt also has to go to HD 3x wk.      Follow Up Recommendations SNF;Supervision/Assistance - 24 hour    Equipment Recommendations  Other (comment)(rolling walker vs rollator)    Recommendations for Other Services       Precautions / Restrictions Precautions Precautions: Fall      Mobility  Bed Mobility Overal bed mobility: Needs Assistance Bed Mobility: Supine to Sit     Supine to sit: Mod assist;HOB elevated     General bed mobility comments: Assist to elevate trunk into sitting and bring hips to EOB  Transfers Overall transfer level: Needs assistance Equipment used: 4-wheeled walker Transfers: Sit to/from [facility] Sit to Stand: Mod assist;+2 safety/equipment Stand pivot transfers: Mod assist;+2 safety/equipment       General transfer comment: Assist to bring hips up and for balance. Small pivotal steps from bed to recliner with instability of LLE.   Ambulation/Gait             General Gait Details: Unable due to instability of LE's  Stairs            Wheelchair Mobility    Modified Rankin (Stroke Patients Only)       Balance Overall balance assessment: Needs assistance Sitting-balance support: Bilateral upper extremity supported;Feet supported Sitting balance-Leahy Scale: Poor Sitting balance - Comments: requires UE assist and occasional min assist Postural control: Posterior lean Standing  balance support: Bilateral upper extremity supported Standing balance-Leahy Scale: Poor Standing balance comment: walker and min assist for static standing                             Pertinent Vitals/Pain Pain Assessment: No/denies pain    Home Living Family/patient expects to be discharged to:: Private residence Living Arrangements: Spouse/significant other Available Help at Discharge: Available 24 hours/day;Family Type of Home: Apartment Home Access: Stairs to enter Entrance Stairs-Rails: Can reach both Entrance Stairs-Number of Steps: 13 Home Layout: One level Home Equipment: Shower seat;Cane - single point;Grab bars - tub/shower      Prior Function Level of Independence: Needs assistance   Gait / Transfers Assistance Needed: amb with cane, recent frequent falls           Hand Dominance   Dominant Hand: Right    Extremity/Trunk Assessment   Upper Extremity Assessment Upper Extremity Assessment: Generalized weakness    Lower Extremity Assessment Lower Extremity Assessment: Generalized weakness       Communication   Communication: No difficulties  Cognition Arousal/Alertness: Awake/alert Behavior During Therapy: WFL for tasks assessed/performed Overall Cognitive Status: Within Functional Limits for tasks assessed                                        General Comments General comments (skin integrity, edema, etc.): Removed O2  with SpO2 >94% throughout    Exercises     Assessment/Plan    PT Assessment Patient needs continued PT services  PT Problem List Decreased strength;Decreased activity tolerance;Decreased balance;Decreased mobility;Obesity       PT Treatment Interventions DME instruction;Stair training;Gait training;Functional mobility training;Therapeutic activities;Therapeutic exercise;Balance training;Patient/family education    PT Goals (Current goals can be found in the Care Plan section)  Acute Rehab PT  Goals Patient Stated Goal: stop falling PT Goal Formulation: With patient Time For Goal Achievement: 10/21/19 Potential to Achieve Goals: Good    Frequency Min 3X/week   Barriers to discharge Inaccessible home environment lives in second story apt    Co-evaluation               AM-PAC PT "6 Clicks" Mobility  Outcome Measure Help needed turning from your back to your side while in a flat bed without using bedrails?: A Little Help needed moving from lying on your back to sitting on the side of a flat bed without using bedrails?: A Lot Help needed moving to and from a bed to a chair (including a wheelchair)?: A Lot Help needed standing up from a chair using your arms (e.g., wheelchair or bedside chair)?: A Lot Help needed to walk in hospital room?: Total Help needed climbing 3-5 steps with a railing? : Total 6 Click Score: 11    End of Session Equipment Utilized During Treatment: Gait belt Activity Tolerance: Patient limited by fatigue Patient left: in chair;with call bell/phone within reach;with chair alarm set   PT Visit Diagnosis: Unsteadiness on feet (R26.81);Other abnormalities of gait and mobility (R26.89);Muscle weakness (generalized) (M62.81);History of falling (Z91.81)    Time: 1537-1600 PT Time Calculation (min) (ACUTE ONLY): 23 min   Charges:   PT Evaluation $PT Eval Moderate Complexity: 1 Mod PT Treatments $Therapeutic Activity: 8-22 mins        [place] Pager [phone] Office [place] 10/07/2019, 6:03 PM

## 2019-10-07 NOTE — Interval H&P Note (Signed)
History and Physical Interval Note:  10/07/2019 8:51 AM  [place]  has presented today for surgery, with the diagnosis of Anemia and heme positive stool.  The various methods of treatment have been discussed with the patient and family. After consideration of risks, benefits and other options for treatment, the patient has consented to  Procedure(s): ENTEROSCOPY (N/A) COLONOSCOPY WITH PROPOFOL (N/A) as a surgical intervention.  The patient's history has been reviewed, patient examined, no change in status, stable for surgery.  I have reviewed the patient's chart and labs.  Questions were answered to the patient's satisfaction.     Jackquline Denmark

## 2019-10-07 NOTE — Op Note (Signed)
[facility] Patient Name: [name] [name] Procedure Date : 10/07/2019 MRN: [id number] Attending MD: Jackquline Denmark , MD Date of Birth: 07/14/1951 CSN: [id number] Age: 68 Admit Type: Inpatient Procedure:                Small bowel enteroscopy Indications:              Acute on chronic anemia with heme positive stools.                            Neg EGD 07/2019 except for mod HH. H/O colonic AVMs                            2017.                           ESRD on HD                           Associated CHF (EF 40-45%), CAD s/p CABG, CVA, DM,                            HTN, obesity, PVD Providers:                Jackquline Denmark, MD, Josie Dixon, RN, Burtis Junes,                            RN, Cherylynn Ridges, Technician, Dellie Catholic, CRNA Referring MD:              Medicines:                Monitored Anesthesia Care Complications:            No immediate complications. Estimated Blood Loss:     Estimated blood loss: none. Procedure:                Pre-Anesthesia Assessment:                           - Prior to the procedure, a History and Physical                            was performed, and patient medications and                            allergies were reviewed. The patient's tolerance of                            previous anesthesia was also reviewed. The risks                            and benefits of the procedure and the sedation                            options and risks were discussed with the patient.  All questions were answered, and informed consent                            was obtained. Prior Anticoagulants: The patient has                            taken no previous anticoagulant or antiplatelet                            agents. ASA Grade Assessment: IV - A patient with                            severe systemic disease that is a constant threat                            to life. After reviewing the risks and benefits,                 the patient was deemed in satisfactory condition to                            undergo the procedure.                           After obtaining informed consent, the endoscope was                            passed under direct vision. Throughout the                            procedure, the patient's blood pressure, pulse, and                            oxygen saturations were monitored continuously. The                            PCF-H190DL ([id number] Olympus pediatric colonscope                            was introduced through the mouth and advanced to                            the Px/mid-jejunum. The small bowel enteroscopy was                            accomplished without difficulty. The patient                            tolerated the procedure well. Scope In: Scope Out: Findings:      The examined esophagus was normal. Wide open distal esophageal stricture       was noted at the GE junction 38 cm from the incisors.      A 5 cm hiatal hernia was present. A small nonbleeding [name] erosion       was present. Gastric mucosa otherwise was unremarkable.  There was no evidence of significant pathology in the entire examined       duodenum.      There was no evidence of significant pathology in the entire examined       portion of jejunum. Area was tattooed with an injection of 2 mL of [place]       ink. Glucagon 0.4 mg was given during withdrawal in 2 doses. Impression:               - Moderate hiatal hernia with a small nonbleeding                            Cameron erosion.                           - The examined portion of the jejunum was normal.                            Tattooed.                           - No active bleeding. Recommendation:           -Return patient to hospital ward for ongoing care.                           -Avoid nonsteroidals.                           -Trend CBC. Transfuse as needed to keep hemoglobin                            above  8. I believe she will become transfusion                            dependent. Management of anemia as per nephrology.                           -She was originally scheduled for enteroscopy and                            colonoscopy. She could not tolerate the colonoscopy                            preparation and refused to drink anymore. On                            review, she had colonoscopy in 2017 which showed a                            nonbleeding colonic AVM. We would hold off on                            repeating colonoscopy.                           -Continue Protonix 40 mg p.o. once  a day.                           -No further endoscopic procedures planned during                            this admission. Will sign off. Procedure Code(s):        --- Professional ---                           [phone], Small intestinal endoscopy, enteroscopy                            beyond second portion of duodenum, not including                            ileum; diagnostic, including collection of                            specimen(s) by brushing or washing, when performed                            (separate procedure)                           44799, Unlisted procedure, small intestine Diagnosis Code(s):        --- Professional ---                           K44.9, Diaphragmatic hernia without obstruction or                            gangrene                           K92.2, Gastrointestinal hemorrhage, unspecified CPT copyright 2019 American Medical Association. All rights reserved. The codes documented in this report are preliminary and upon coder review may  be revised to meet current compliance requirements. Jackquline Denmark, MD 10/07/2019 10:36:37 AM This report has been signed electronically. Number of Addenda: 0

## 2019-10-07 NOTE — Progress Notes (Signed)
PROGRESS NOTE    [name] [name]  VPX:[id number] DOB: [birth year] DOA: 10/05/2019 PCP: [facility]    Brief Narrative:  68 y.o.femalewith medical history significant ofCAD status post CABG, end-stage renal disease on dialysis T/Th/Sat, mixed diastolic/systolic congestive heart failure, CVA, history ofhemoccult stool and anemia, type 2 diabetes who presented s/p dizziness leading to a fall.  Regarding falls, she states that she has been feeling dizzy and feeling unsteady even with her cane when ambulating, and had fallen three times in past three days. PerED documentation, patient was seen at [facility] yesterday (10/6) for fall without any acute findings. Orthostatic vital signs were normal at that time. Hemoglobin was 7.8, but on presentation today (10/7) Hbg is down to 7 (was 9.3 one month ago).  Fecal occult blood positive and black tarry stool noted by EDP.  Regarding her anemia, she states she has had 3 days of dark stools with no bright red blood per rectum. She also reports that she has missed dialysis for this entire week.  She denies any chest pain, palpitations shortness of breath. Denies any nausea, vomiting, abdominal pain.  Denies use of NSAID's.  In the ED, pt noted to desaturate to low 80's with ambulation, required 3 L/min O2. EDP discussed case with gastroenterologist who recommended IV PPI and NPO past midnight. Nephrology also consulted and recommended 2 doses of Lokelma and an amp of bicarb. Nephrologyreportedly felt reasonable to give her a unit of blood although cautioned increase potassium.  Patient was transferred to [facility] and underwent dialysis today, 10/8.  GI is following, indicate similar issues in August 2020.  Had EGD at that time that did not identify any evidence of active bleeding.  Also has history of nonbleeding AVM's noted on prior colonoscopy in 2017.  Patient underwent EGD 10/9 which showed a moderate hiatal hernia with a small  non-bleeding Cameron erosion.  No active bleeding was seen.  Patient likely to have chronic/intermittent blood loss, and possibly become transfusion dependent, per GI.  Patient did not tolerate bowel prep, therefore colonoscopy was not performed.   Assessment & Plan:   Principal Problem:   Acute GI bleeding Active Problems:   Type 2 diabetes mellitus with chronic kidney disease on chronic dialysis, with long-term current use of insulin (HCC)   Chronic combined systolic and diastolic CHF (congestive heart failure) (HCC)   ESRD (end stage renal disease) (HCC)   Iron deficiency anemia   Hyperlipidemia   History of CVA (cerebrovascular accident)   Essential hypertension   Coronary artery disease   S/P CABG (coronary artery bypass graft)   Depression  Acute GI Bleeding: source likely [name] erosion noted on EGD 10/9 vs previously noted colon AVM (seen in 2017) vs GI malignancy.  Colonoscopy unable to be performed as patient did not tolerate bowel prep.  Pt remains hemodynamically stable.  Heme positive stool and dark tarry stool noted by EDP.  Patient is followed by GI, has history of iron deficiency anemia and heme positive stool.  Per GI outpatient documentation, patient had plans for repair of her AVM but unsure whether patient actually followed up.   - GI signed off - resume home oral PPI - resume home iron when taking PO  Acute on Chronic Iron Deficiency Anemia: secondary to GI blood loss, as above.  Hbg 7.0 on admission, from 9.3 one month ago, in setting of above.  S/p 1 unit pRBC's.  - per GI, transfuse as needed, TARGET Hbg >=  8 - monitor Hbg & transfuse as needed   Hyperkalemia: in setting of end-stage renal disease: recived Lokelma x2 and 1 amp bicarb in ED.  No ECK changes noted.  Patient had missed a week of dialysis prior to presentation.  Underwent dialysis, 10/8.   - monitor BMP daily  End Stage Renal Disease: on hemodialysis (T/Th/Sat):  - Dialysis tomorrow, on  schedule  Recurrent falls: suspect secondary to acute anemia and possibly metabolic derangements from missed dialysis.  Pt uses cane for ambulation at baseline. -orthostatic normal in ED visit 10/6 -repeat orthostatic vitals - PT/OT eval  CAD status post CABG: chronic, stable.  - aspirin on hold due to GI bleed - statin on hold while NPO  History of diastolic and systolic congestive heart failure: chronic, stable  Hypertension: chronic -Amlodipine held for soft blood pressures   hX CVA:  -Hold aspirin secondary to GI bleed  Type 2 diabetes with peripheral neuropathy -Normally takes 2 units 3 times daily with meals -Low-dose sliding scale insulin -Continue gabapentin when able to take p.o.  Depression -Continue Wellbutrin  COPD: chronic, not acutely exacerbated - continue Dulera, Atrovent   DVT prophylaxis: SCD's Code Status:  Full code Family Communication: none at bedside Disposition Plan: home once cleared by GI   Consultants:   Gastroenterology          Procedures:   EGD 10/07/19, findings as above  Antimicrobials:   None     Subjective: Pt seen and examined, up in chair in her room.  Reports still feeling unsteady on her feet, which has been ongoing for quite some time.  Reports bowel movement earlier in the day that was dark.  Worked with PT, states they said she needs rehab and she is agreeable.  Denies fever, chills, abdominal pain, nausea, vomiting or diarrhea.  No acute events reported overnight.    Objective: Vitals:   10/07/19 1036 10/07/19 1056 10/07/19 1151 10/07/19 1556  BP: (!) 107/41 (!) 114/96 123/60 117/68  Pulse: 73  78 79  Resp: 16  19 18   Temp:   97.8 F (36.6 C) 98.7 F (37.1 C)  TempSrc:   Oral Oral  SpO2: 100%  100% 98%  Weight:        Intake/Output Summary (Last 24 hours) at 10/07/2019 1748 Last data filed at 10/07/2019 1733 Gross per 24 hour  Intake 720 ml  Output -  Net 720 ml   Filed Weights   10/06/19  0720 10/06/19 1739 10/07/19 0520  Weight: 123.1 kg 125.7 kg 124.7 kg    Examination:  General exam: Appears calm and comfortable  Respiratory system: Clear to auscultation, but decreased breat sounds secondary to body habitus. Respiratory effort normal. Cardiovascular system:  Distant heart sounds. S1 & S2 heard, RRR. No JVD, murmurs, rubs, gallops or clicks.  Gastrointestinal system: Abdomen is nondistended, soft and nontender.  Central nervous system: Alert and oriented. No gross focal neurological deficits. Extremities: moves all, trace edema Psychiatry: Judgement and insight appear normal. Mood & affect appropriate.     Data Reviewed: I have personally reviewed following labs and imaging studies  CBC: Recent Labs  Lab 10/04/19 0828 10/05/19 1855 10/06/19 0021 10/06/19 1850  WBC 10.9* 8.2  --  8.1  NEUTROABS  --  5.5  --   --   HGB 7.8* 7.0*  --  8.4*  HCT 26.7* 23.9* 22.0* 26.0*  MCV 94.3 94.8  --  89.0  PLT 355 340  --  266   Basic  Metabolic Panel: Recent Labs  Lab 10/04/19 0828 10/05/19 1855 10/06/19 0021 10/07/19 1327  NA 140 140 139  --   K 4.7 6.2* 5.3*  --   CL 97* 102 101  --   CO2 26 23 23   --   GLUCOSE 137* 116* 194*  --   BUN 51* 89* 93*  --   CREATININE 8.87* 10.78* 11.41*  --   CALCIUM 8.3* 8.0* 7.7*  --   PHOS  --   --   --  5.8*   GFR: Estimated Creatinine Clearance: 6.6 mL/min (A) (by C-G formula based on SCr of 11.41 mg/dL (H)). Liver Function Tests: Recent Labs  Lab 10/05/19 1855  AST 15  ALT 14  ALKPHOS 83  BILITOT 0.6  PROT 6.7  ALBUMIN 3.2*   Recent Labs  Lab 10/05/19 1855  LIPASE 35   No results for input(s): AMMONIA in the last 168 hours. Coagulation Profile: No results for input(s): INR, PROTIME in the last 168 hours. Cardiac Enzymes: No results for input(s): CKTOTAL, CKMB, CKMBINDEX, TROPONINI in the last 168 hours. BNP (last 3 results) No results for input(s): PROBNP in the last 8760 hours. HbA1C: No results for  input(s): HGBA1C in the last 72 hours. CBG: Recent Labs  Lab 10/06/19 1624 10/06/19 2141 10/07/19 0825 10/07/19 1146 10/07/19 1618  GLUCAP 98 124* 77 130* 103*   Lipid Profile: No results for input(s): CHOL, HDL, LDLCALC, TRIG, CHOLHDL, LDLDIRECT in the last 72 hours. Thyroid Function Tests: No results for input(s): TSH, T4TOTAL, FREET4, T3FREE, THYROIDAB in the last 72 hours. Anemia Panel: Recent Labs    10/06/19 0021  VITAMINB12 290  FERRITIN 133  TIBC 235*  IRON 27*   Sepsis Labs: No results for input(s): PROCALCITON, LATICACIDVEN in the last 168 hours.  Recent Results (from the past 240 hour(s))  SARS Coronavirus 2 [facility] order, Performed in [facility] hospital lab) Nasopharyngeal Nasopharyngeal Swab     Status: None   Collection Time: 10/05/19  8:46 PM   Specimen: Nasopharyngeal Swab  Result Value Ref Range Status   SARS Coronavirus 2 NEGATIVE NEGATIVE Final    Comment: (NOTE) If result is NEGATIVE SARS-CoV-2 target nucleic acids are NOT DETECTED. The SARS-CoV-2 RNA is generally detectable in upper and lower  respiratory specimens during the acute phase of infection. The lowest  concentration of SARS-CoV-2 viral copies this assay can detect is 250  copies / mL. A negative result does not preclude SARS-CoV-2 infection  and should not be used as the sole basis for treatment or other  patient management decisions.  A negative result may occur with  improper specimen collection / handling, submission of specimen other  than nasopharyngeal swab, presence of viral mutation(s) within the  areas targeted by this assay, and inadequate number of viral copies  (<250 copies / mL). A negative result must be combined with clinical  observations, patient history, and epidemiological information. If result is POSITIVE SARS-CoV-2 target nucleic acids are DETECTED. The SARS-CoV-2 RNA is generally detectable in upper and lower  respiratory specimens dur ing the acute phase of  infection.  Positive  results are indicative of active infection with SARS-CoV-2.  Clinical  correlation with patient history and other diagnostic information is  necessary to determine patient infection status.  Positive results do  not rule out bacterial infection or co-infection with other viruses. If result is PRESUMPTIVE POSTIVE SARS-CoV-2 nucleic acids MAY BE PRESENT.   A presumptive positive result was obtained on the submitted specimen  and confirmed on repeat testing.  While 2019 novel coronavirus  (SARS-CoV-2) nucleic acids may be present in the submitted sample  additional confirmatory testing may be necessary for epidemiological  and / or clinical management purposes  to differentiate between  SARS-CoV-2 and other Sarbecovirus currently known to infect humans.  If clinically indicated additional testing with an alternate test  methodology [phone]) is advised. The SARS-CoV-2 RNA is generally  detectable in upper and lower respiratory sp ecimens during the acute  phase of infection. The expected result is Negative. Fact Sheet for Patients:  StrictlyIdeas.no Fact Sheet for Healthcare Providers: BankingDealers.co.za This test is not yet approved or cleared by the [place] FDA and has been authorized for detection and/or diagnosis of SARS-CoV-2 by FDA under an Emergency Use Authorization (EUA).  This EUA will remain in effect (meaning this test can be used) for the duration of the COVID-19 declaration under Section 564(b)(1) of the Act, 21 U.S.C. section 360bbb-3(b)(1), unless the authorization is terminated or revoked sooner. Performed at [facility], [place] [address], [place], [place] [address]   MRSA PCR Screening     Status: None   Collection Time: 10/06/19 10:40 PM   Specimen: Nasopharyngeal  Result Value Ref Range Status   MRSA by PCR NEGATIVE NEGATIVE Final    Comment:        The GeneXpert MRSA  Assay (FDA approved for NASAL specimens only), is one component of a comprehensive MRSA colonization surveillance program. It is not intended to diagnose MRSA infection nor to guide or monitor treatment for MRSA infections. Performed at [facility], [place] [address] [place], [place] [address]          Radiology Studies: No results found.      Scheduled Meds: . sodium chloride   Intravenous Once  . buPROPion  150 mg Oral BID  . Chlorhexidine Gluconate Cloth  6 each Topical Q0600  . darbepoetin (ARANESP) injection - DIALYSIS  100 mcg Intravenous Q Thu-HD  . ferrous sulfate  325 mg Oral BID WC  . gabapentin  300 mg Oral Daily  . insulin aspart  0-15 Units Subcutaneous TID WC   Continuous Infusions:   LOS: 2 days    Time spent: 40-45 min    Ezekiel Slocumb, MD [facility] Pager [phone]  If 7PM-7AM, please contact night-coverage [email or web address] Password [email or web address] 10/07/2019, 5:48 PM

## 2019-10-07 NOTE — Anesthesia Postprocedure Evaluation (Signed)
Anesthesia Post Note  Patient: [name] [name]  Procedure(s) Performed: ENTEROSCOPY (N/A ) COLONOSCOPY WITH PROPOFOL (N/A ) SUBMUCOSAL TATTOO INJECTION     Patient location during evaluation: Endoscopy Anesthesia Type: MAC Level of consciousness: awake and alert Pain management: pain level controlled Vital Signs Assessment: post-procedure vital signs reviewed and stable Respiratory status: spontaneous breathing, nonlabored ventilation, respiratory function stable and patient connected to nasal cannula oxygen Cardiovascular status: stable and blood pressure returned to baseline Postop Assessment: no apparent nausea or vomiting Anesthetic complications: no    Last Vitals:  Vitals:   10/07/19 1056 10/07/19 1151  BP: (!) 114/96 123/60  Pulse:  78  Resp:  19  Temp:  36.6 C  SpO2:  100%    Last Pain:  Vitals:   10/07/19 1151  TempSrc: Oral  PainSc:                  Janazia Schreier P Kahlen Morais

## 2019-10-07 NOTE — Progress Notes (Signed)
  [facility] Progress Note   Assessment/ Plan:    Dialysis orders: [facility] TTS 4 hr 15 min EDW 121 kg F 180 dialyzer 2K/ 2 Ca bath BFR 400 via TDC, maturing AVF No profile   Mircera 100 q 2 weeks, to start 10/14, micera 30 given 10/1 Calcitriol 0.25 mcg q rx No heparin No binders listed   1 Weakness/ falls: likely blood loss anemia, orthostatics, per primary 2.  Heme + stool, with iron def anemia, s/p 1 u PRBCs with appropriate bump, enteroscopy with no source of bleeding, c-scope not performed d/t poor prep, no further imaging this admission 3 ESRD: TTS- provide HD on sched- next tomorrow 4 Hypertension: pressures soft, would hold antihypertensives 5. Anemia of ESRD: Hgb down to 7.0, got prbcs, will increase ESA to Aranesp 100 q week and follow 6. 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  Dispo: pending  Subjective:    Seen s/p enteroscopy, no source of bleeding.  Sleeping, comfortable, says she's OK.   Objective:   BP 123/60 (BP Location: Right Arm)   Pulse 78   Temp 97.8 F (36.6 C) (Oral)   Resp 19   Wt 124.7 kg   SpO2 100%   BMI 41.80 kg/m   Physical Exam: GEN older woman, lying in bed, NAD HEENT wearing glasses NECK no JVD PULM clear bilaterally no c/w/r CV RRR no m/r/g ABD soft, obese, mildly tender in LUQ EXT trace LE edema NEURO AAO x 3 SKIN no rashes MSK no effusions ACCESS TDC in place, dressing c/d/i, LUE AVF + T/B, maturing nicely  Labs: BMET Recent Labs  Lab 10/04/19 0828 10/05/19 1855 10/06/19 0021 10/07/19 1327  NA 140 140 139  --   K 4.7 6.2* 5.3*  --   CL 97* 102 101  --   CO2 26 23 23   --   GLUCOSE 137* 116* 194*  --   BUN 51* 89* 93*  --   CREATININE 8.87* 10.78* 11.41*  --   CALCIUM 8.3* 8.0* 7.7*  --   PHOS  --   --   --  5.8*   CBC Recent Labs  Lab 10/04/19 0828 10/05/19 1855 10/06/19 1850  WBC 10.9* 8.2 8.1  NEUTROABS  --  5.5  --    HGB 7.8* 7.0* 8.4*  HCT 26.7* 23.9* 26.0*  MCV 94.3 94.8 89.0  PLT 355 340 266    @IMGRELPRIORS @ Medications:    . sodium chloride   Intravenous Once  . buPROPion  150 mg Oral BID  . Chlorhexidine Gluconate Cloth  6 each Topical Q0600  . darbepoetin (ARANESP) injection - DIALYSIS  100 mcg Intravenous Q Thu-HD  . ferrous sulfate  325 mg Oral BID WC  . gabapentin  300 mg Oral Daily  . insulin aspart  0-15 Units Subcutaneous TID WC     Madelon Lips MD 10/07/2019, 3:34 PM

## 2019-10-07 NOTE — Anesthesia Procedure Notes (Signed)
Procedure Name: MAC Date/Time: 10/07/2019 9:48 AM Performed by: Candis Shine, CRNA Pre-anesthesia Checklist: Patient identified, Emergency Drugs available, Suction available, Timeout performed and Patient being monitored Patient Re-evaluated:Patient Re-evaluated prior to induction Oxygen Delivery Method: Nasal cannula Dental Injury: Teeth and Oropharynx as per pre-operative assessment

## 2019-10-07 NOTE — Progress Notes (Signed)
Pt is refusing to drink Moviprep. Moviprep is given to pt since yesterday evening until now. Pt was provided with ice to help the taste. Pt only drinks  approximately less than a half liter. Discussed to pt multiple times tonight. Pt stated "But I don't like it. Don't force me. Who likes to drink that". MD made aware.

## 2019-10-07 NOTE — Transfer of Care (Signed)
Immediate Anesthesia Transfer of Care Note  Patient: [name] [name]  Procedure(s) Performed: ENTEROSCOPY (N/A ) COLONOSCOPY WITH PROPOFOL (N/A ) SUBMUCOSAL TATTOO INJECTION  Patient Location: Endoscopy Unit  Anesthesia Type:MAC  Level of Consciousness: awake, alert  and oriented  Airway & Oxygen Therapy: Patient Spontanous Breathing and Patient connected to nasal cannula oxygen  Post-op Assessment: Report given to RN and Post -op Vital signs reviewed and stable  Post vital signs: Reviewed and stable  Last Vitals:  Vitals Value Taken Time  BP 124/48 10/07/19 1028  Temp 35.7 C 10/07/19 1028  Pulse 73 10/07/19 1036  Resp 16 10/07/19 1036  SpO2 100 % 10/07/19 1036  Vitals shown include unvalidated device data.  Last Pain:  Vitals:   10/07/19 1028  TempSrc: Temporal  PainSc: Asleep         Complications: No apparent anesthesia complications

## 2019-10-08 DIAGNOSIS — I4891 Unspecified atrial fibrillation: Secondary | ICD-10-CM

## 2019-10-08 LAB — CBC WITH DIFFERENTIAL/PLATELET
Abs Immature Granulocytes: 0.04 10*3/uL (ref 0.00–0.07)
Basophils Absolute: 0 10*3/uL (ref 0.0–0.1)
Basophils Relative: 1 %
Eosinophils Absolute: 0.3 10*3/uL (ref 0.0–0.5)
Eosinophils Relative: 6 %
HCT: 21.8 % — ABNORMAL LOW (ref 36.0–46.0)
Hemoglobin: 7 g/dL — ABNORMAL LOW (ref 12.0–15.0)
Immature Granulocytes: 1 %
Lymphocytes Relative: 15 %
Lymphs Abs: 0.9 10*3/uL (ref 0.7–4.0)
MCH: 28.8 pg (ref 26.0–34.0)
MCHC: 32.1 g/dL (ref 30.0–36.0)
MCV: 89.7 fL (ref 80.0–100.0)
Monocytes Absolute: 0.7 10*3/uL (ref 0.1–1.0)
Monocytes Relative: 11 %
Neutro Abs: 3.9 10*3/uL (ref 1.7–7.7)
Neutrophils Relative %: 66 %
Platelets: 255 10*3/uL (ref 150–400)
RBC: 2.43 MIL/uL — ABNORMAL LOW (ref 3.87–5.11)
RDW: 18.6 % — ABNORMAL HIGH (ref 11.5–15.5)
WBC: 5.8 10*3/uL (ref 4.0–10.5)
nRBC: 0 % (ref 0.0–0.2)

## 2019-10-08 LAB — MAGNESIUM: Magnesium: 1.7 mg/dL (ref 1.7–2.4)

## 2019-10-08 LAB — GLUCOSE, CAPILLARY
Glucose-Capillary: 102 mg/dL — ABNORMAL HIGH (ref 70–99)
Glucose-Capillary: 142 mg/dL — ABNORMAL HIGH (ref 70–99)
Glucose-Capillary: 170 mg/dL — ABNORMAL HIGH (ref 70–99)
Glucose-Capillary: 123 mg/dL — ABNORMAL HIGH (ref 70–99)

## 2019-10-08 LAB — PREPARE RBC (CROSSMATCH)

## 2019-10-08 LAB — IRON AND TIBC
Iron: 33 ug/dL (ref 28–170)
Saturation Ratios: 15 % (ref 10.4–31.8)
TIBC: 216 ug/dL — ABNORMAL LOW (ref 250–450)
UIBC: 183 ug/dL

## 2019-10-08 LAB — POTASSIUM: Potassium: 3.8 mmol/L (ref 3.5–5.1)

## 2019-10-08 LAB — HEMOGLOBIN AND HEMATOCRIT, BLOOD
HCT: 26.5 % — ABNORMAL LOW (ref 36.0–46.0)
Hemoglobin: 8.5 g/dL — ABNORMAL LOW (ref 12.0–15.0)

## 2019-10-08 LAB — FERRITIN: Ferritin: 222 ng/mL (ref 11–307)

## 2019-10-08 MED ORDER — HEPARIN SODIUM (PORCINE) 1000 UNIT/ML IJ SOLN
INTRAMUSCULAR | Status: AC
  Filled 2019-10-08: qty 4

## 2019-10-08 MED ORDER — SODIUM CHLORIDE 0.9% IV SOLUTION
Freq: Once | INTRAVENOUS | Status: AC
  Administered 2019-10-08: 11:00:00 via INTRAVENOUS

## 2019-10-08 MED ORDER — METOPROLOL TARTRATE 12.5 MG HALF TABLET
12.5000 mg | ORAL_TABLET | Freq: Two times a day (BID) | ORAL | Status: DC
  Administered 2019-10-08 – 2019-10-10 (×4): 12.5 mg via ORAL
  Filled 2019-10-08 (×5): qty 1

## 2019-10-08 NOTE — NC FL2 (Signed)
[place] LEVEL OF CARE SCREENING TOOL     IDENTIFICATION  Patient Name: [name] [name] Birthdate: Dec 13, 1951 Sex: female Admission Date (Current Location): 10/05/2019  [facility] and [place] Number:  Herbalist and Address:  [facility] [place], [place] [address], [place], [place] [address]      Provider Number: [id number]  Attending Physician Name and Address:  Ezekiel Slocumb, DO  Relative Name and Phone Number:       Current Level of Care: Hospital Recommended Level of Care: [place] Prior Approval Number:    Date Approved/Denied:   PASRR Number: [id number]  Discharge Plan: SNF    Current Diagnoses: Patient Active Problem List   Diagnosis Date Noted  . Acute GI bleeding 10/05/2019  . Hyperkalemia   . Depression   . Esophageal stricture   . Diabetic gastroparesis ([place])   . Upper GI bleed 08/16/2019  . Hiatal hernia   . Iron deficiency anemia   . Microcytic anemia 02/05/2016  . Acute on chronic renal failure ([place])   . CAD in native artery   . Substance abuse ([place])   . Chronic combined systolic and diastolic CHF (congestive heart failure) ([place])   . ESRD (end stage renal disease) ([place])   . Obesity, Class III, BMI 40-49.9 (morbid obesity) ([place])   . Type 2 diabetes mellitus with chronic kidney disease on chronic dialysis, with long-term current use of insulin ([place]) 10/18/2014  . S/P CABG (coronary artery bypass graft) 09/21/2013  . Arthritis of right shoulder region 03/23/2013  . Tobacco abuse   . Coronary artery disease   . NSTEMI (non-ST elevated myocardial infarction) ([place]) 06/29/2012  . History of cocaine abuse ([place]) 06/29/2012  . Essential hypertension 06/29/2012  . Glaucoma   . Hyperlipidemia   . History of CVA (cerebrovascular accident)     Orientation RESPIRATION BLADDER Height & Weight     Self, Time, Situation, Place  Normal Continent Weight: 277 lb 12.5 oz (126 kg) Height:     BEHAVIORAL  SYMPTOMS/MOOD NEUROLOGICAL BOWEL NUTRITION STATUS      Continent Diet  AMBULATORY STATUS COMMUNICATION OF NEEDS Skin   Extensive Assist Verbally Normal                       Personal Care Assistance Level of Assistance  Bathing, Feeding, Dressing Bathing Assistance: Maximum assistance Feeding assistance: Independent Dressing Assistance: Maximum assistance     Functional Limitations Info  Sight, Hearing, Speech Sight Info: Adequate Hearing Info: Adequate Speech Info: Adequate    SPECIAL CARE FACTORS FREQUENCY  PT (By licensed PT), OT (By licensed OT)     PT Frequency: 5x OT Frequency: 5x            Contractures Contractures Info: Not present    Additional Factors Info  Code Status, Allergies Code Status Info: DNR Allergies Info: Naproxen           Current Medications (10/08/2019):  This is the current hospital active medication list Current Facility-Administered Medications  Medication Dose Route Frequency Provider Last Rate Last Dose  . buPROPion [facility] SR) 12 hr tablet 150 mg  150 mg Oral BID Tu, Ching T, DO   150 mg at 10/07/19 2202  . Chlorhexidine Gluconate Cloth 2 % PADS 6 each  6 each Topical Q0600 [name], MD   6 each at 10/08/19 [phone]  . Darbepoetin Alfa (ARANESP) injection 100 mcg  100 mcg Intravenous Q Thu-HD  Madelon Lips, MD   100 mcg at 10/06/19 1404  . ferrous sulfate tablet 325 mg  325 mg Oral BID WC Tu, Ching T, DO   325 mg at 10/07/19 1731  . gabapentin (NEURONTIN) capsule 300 mg  300 mg Oral Daily Tu, Ching T, DO   300 mg at 10/07/19 1233  . insulin aspart (novoLOG) injection 0-15 Units  0-15 Units Subcutaneous TID WC Tu, Ching T, DO   2 Units at 10/07/19 1231  . ipratropium (ATROVENT) nebulizer solution 0.5 mg  0.5 mg Nebulization Q4H PRN Tu, Ching T, DO      . mometasone-formoterol (DULERA) 100-5 MCG/ACT inhaler 2 puff  2 puff Inhalation Q4H PRN Tu, Ching T, DO      . pantoprazole (PROTONIX) EC tablet 40 mg  40 mg Oral Daily  Nicole Kindred A, DO   40 mg at 10/07/19 2202     Discharge Medications: Please see discharge summary for a list of discharge medications.  Relevant Imaging Results:  Relevant Lab Results:   Additional Information [id number], [facility] on a TTS schedule with a 7am seat time  Shirlee Whitmire A Larisa Lanius, LCSW

## 2019-10-08 NOTE — Progress Notes (Signed)
PROGRESS NOTE    [name] [name]  BTD:[id number] DOB: 08-Jan-1951 DOA: 10/05/2019 PCP: [facility]   Brief Narrative:  68 y.o.femalewith medical history significant ofCAD status post CABG, end-stage renal disease on dialysisT/Th/Sat,mixeddiastolic/systolic congestive heart failure, CVA, history ofhemoccult stool and anemia, type 2 diabetes who presenteds/p dizziness leading to a fall.Regardingfalls, she states that she has been feeling dizzy and feeling unsteady even with her cane when ambulating, and had fallen three times in past three days. PerED documentation, patient was [facility](10/[facility])for fall without any acute findings. Orthostatic vital signswerenormal at that time.Hemoglobin was 7.8, but on presentation today (10/7) Hbg is down to 7 (was 9.3 one month ago). Fecal occult blood positive and black tarry stool noted by EDP. Regardingher anemia, she states she has had 3 days of dark stools with no bright red blood per rectum. She also reports that she has missed dialysis for this entire week. She denies any chest pain, palpitations shortness of breath. Denies any nausea, vomiting, abdominal pain.Denies use of NSAID's. In the ED, pt noted to desaturate to low 80's with ambulation, required 3 L/min O2. EDP discussed case with gastroenterologist who recommended IV PPI and NPOpast midnight. Nephrology also consulted and recommended 2 doses of Lokelma and an amp of bicarb. Nephrologyreportedly felt reasonable to give her a unit of blood although cautioned increase potassium.Patient was transferred to [facility] and underwent dialysis today, 10/8. GI is following, indicate similar issues in August 2020. Had EGD at that time that did not identify any evidence of active bleeding. Also has history of nonbleeding AVM's noted on prior colonoscopy in 2017.  Patient underwent EGD 10/9 which showed a moderate hiatal hernia with a small  non-bleeding Cameron erosion.  No active bleeding was seen.  Patient likely to have chronic/intermittent blood loss, and possibly become transfusion dependent, per GI.  Patient did not tolerate bowel prep, therefore colonoscopy was not performed.    Assessment & Plan:   Principal Problem:   Acute GI bleeding Active Problems:   Type 2 diabetes mellitus with chronic kidney disease on chronic dialysis, with long-term current use of insulin (HCC)   Chronic combined systolic and diastolic CHF (congestive heart failure) (HCC)   ESRD (end stage renal disease) (HCC)   Iron deficiency anemia   Hyperlipidemia   History of CVA (cerebrovascular accident)   Essential hypertension   Coronary artery disease   S/P CABG (coronary artery bypass graft)   Depression  New Onset A-fib/Flutter: seen on tele today, rate controlled.  No current/previous diagnosis known. - Given anemia and ongoing GI bleeding needing transfusion will hold off on anticoagulation at this time - We will start low-dose beta-blocker with hold parameters -cardiology consult  Acute GI Bleeding: likely has chronic blood loss as well.  Source likely [name] erosion noted on EGD 10/9 vs previously noted colon AVM (seen in 2017) vs GI malignancy.  Colonoscopy unable to be performed as patient did not tolerate bowel prep. Pt remains hemodynamically stable. Heme positive stool and dark tarry stool noted by EDP. Patient is followed by GI, has history of iron deficiency anemia and heme positive stool. Per GI outpatient documentation, patient had plans for repair of her AVM but unsure whether patient actually followed up.  -Required transfusion this morning for hemoglobin 7.0 -Continue to hold aspirin - GI signed off - resume home oral PPI - resume home iron when taking PO  Acute on Chronic Iron Deficiency Anemia: secondary to GI blood  loss, as above. Hbg 7.0 on admission, from 9.3 one month ago, in setting of above. S/p 1 unit  pRBC's.  - per GI, transfuse as needed, TARGET Hbg >= 8 - monitor Hbg & transfuse as needed   Hyperkalemia:in setting of end-stage renal disease: recived Lokelma x2 and 1 amp bicarb in ED. No ECK changes noted. Patient had missed a week of dialysis prior to presentation. Underwent dialysis 10/8, 10/0.  - monitor BMP daily  End Stage Renal Disease: on hemodialysis (T/Th/Sat):  - Dialysis per nephro  Recurrent falls: suspect secondary to acute anemiaand possibly metabolic derangements frommissed dialysis. Pt uses cane for ambulation at baseline. -orthostatic normal in ED visit10/6 -repeat orthostatic vitals - PT/OT eval  CAD status post CABG: chronic, stable.  - aspirin on hold due to GI bleed - statin on hold while NPO  History of diastolic and systolic congestive heart failure: chronic, stable  Hypertension: chronic -Amlodipineheldforsoftblood pressures  hX CVA: -Hold aspirin secondary to GI bleed  Type 2 diabeteswith peripheral neuropathy -Normally takes 2 units 3 times daily with meals -Low-dose sliding scaleinsulin -Continue gabapentin when able to take p.o.  Depression -Continue Wellbutrin  COPD: chronic, not acutely exacerbated - continue Dulera, Atrovent   DVT prophylaxis:SCD's Code Status:Full code Family Communication:none at bedside Disposition Plan:home once cleared by GI   Consultants:  Gastroenterology  Procedures:   EGD 10/07/19, findings as above  Antimicrobials:   None       Subjective: Patient seen and examined in dialysis.  Sleeping comfortably, but easily arousable.  She denies any acute complaints including fevers or chills, chest pain, shortness of breath, dizziness or lightheadedness.  Objective: Vitals:   10/08/19 1145 10/08/19 1150 10/08/19 1245 10/08/19 1255  BP: 122/64 (!) 118/57 (!) 74/42 92/76  Pulse: 69 72 72   Resp: 16 16 18    Temp: 98.4 F (36.9 C) 98.4 F (36.9 C) 98.9 F  (37.2 C)   TempSrc: Oral Oral    SpO2: 100% 100% 92%   Weight: 124.5 kg       Intake/Output Summary (Last 24 hours) at 10/08/2019 1943 Last data filed at 10/08/2019 1800 Gross per 24 hour  Intake 915 ml  Output 2000 ml  Net -1085 ml   Filed Weights   10/08/19 0500 10/08/19 0825 10/08/19 1145  Weight: 123.6 kg 126 kg 124.5 kg    Examination:  General exam: Sleeping comfortably, arouses easily, no distress  Respiratory system: Clear to auscultation, but decreased breat sounds secondary to body habitus. Respiratory effort normal. Cardiovascular system:  Distant heart sounds. S1 & S2 heard, RRR.  Gastrointestinal system: Abdomen is nondistended, soft and nontender.  Central nervous system: Alert and oriented. No gross focal neurological deficits. Extremities: moves all, trace edema   Data Reviewed: I have personally reviewed following labs and imaging studies  CBC: Recent Labs  Lab 10/04/19 0828 10/05/19 1855 10/06/19 0021 10/06/19 1850 10/08/19 0434  WBC 10.9* 8.2  --  8.1 5.8  NEUTROABS  --  5.5  --   --  3.9  HGB 7.8* 7.0*  --  8.4* 7.0*  HCT 26.7* 23.9* 22.0* 26.0* 21.8*  MCV 94.3 94.8  --  89.0 89.7  PLT 355 340  --  266 401   Basic Metabolic Panel: Recent Labs  Lab 10/04/19 0828 10/05/19 1855 10/06/19 0021 10/07/19 1327 10/07/19 1827 10/08/19 1714  NA 140 140 139  --  136  --   K 4.7 6.2* 5.3*  --  4.1 3.8  CL  97* 102 101  --  96*  --   CO2 26 23 23   --  25  --   GLUCOSE 137* 116* 194*  --  142*  --   BUN 51* 89* 93*  --  46*  --   CREATININE 8.87* 10.78* 11.41*  --  6.78*  --   CALCIUM 8.3* 8.0* 7.7*  --  8.2*  --   MG  --   --   --   --   --  1.7  PHOS  --   --   --  5.8*  --   --    GFR: Estimated Creatinine Clearance: 11 mL/min (A) (by C-G formula based on SCr of 6.78 mg/dL (H)). Liver Function Tests: Recent Labs  Lab 10/05/19 1855  AST 15  ALT 14  ALKPHOS 83  BILITOT 0.6  PROT 6.7  ALBUMIN 3.2*   Recent Labs  Lab 10/05/19 1855   LIPASE 35   No results for input(s): AMMONIA in the last 168 hours. Coagulation Profile: No results for input(s): INR, PROTIME in the last 168 hours. Cardiac Enzymes: No results for input(s): CKTOTAL, CKMB, CKMBINDEX, TROPONINI in the last 168 hours. BNP (last 3 results) No results for input(s): PROBNP in the last 8760 hours. HbA1C: No results for input(s): HGBA1C in the last 72 hours. CBG: Recent Labs  Lab 10/07/19 1618 10/07/19 2153 10/08/19 0804 10/08/19 1244 10/08/19 1534  GLUCAP 103* 126* 102* 142* 170*   Lipid Profile: No results for input(s): CHOL, HDL, LDLCALC, TRIG, CHOLHDL, LDLDIRECT in the last 72 hours. Thyroid Function Tests: No results for input(s): TSH, T4TOTAL, FREET4, T3FREE, THYROIDAB in the last 72 hours. Anemia Panel: Recent Labs    10/06/19 0021 10/08/19 1010  VITAMINB12 290  --   FERRITIN 133 222  TIBC 235* 216*  IRON 27* 33   Sepsis Labs: No results for input(s): PROCALCITON, LATICACIDVEN in the last 168 hours.  Recent Results (from the past 240 hour(s))  SARS Coronavirus 2 [facility] order, Performed in [facility] hospital lab) Nasopharyngeal Nasopharyngeal Swab     Status: None   Collection Time: 10/05/19  8:46 PM   Specimen: Nasopharyngeal Swab  Result Value Ref Range Status   SARS Coronavirus 2 NEGATIVE NEGATIVE Final    Comment: (NOTE) If result is NEGATIVE SARS-CoV-2 target nucleic acids are NOT DETECTED. The SARS-CoV-2 RNA is generally detectable in upper and lower  respiratory specimens during the acute phase of infection. The lowest  concentration of SARS-CoV-2 viral copies this assay can detect is 250  copies / mL. A negative result does not preclude SARS-CoV-2 infection  and should not be used as the sole basis for treatment or other  patient management decisions.  A negative result may occur with  improper specimen collection / handling, submission of specimen other  than nasopharyngeal swab, presence of viral mutation(s)  within the  areas targeted by this assay, and inadequate number of viral copies  (<250 copies / mL). A negative result must be combined with clinical  observations, patient history, and epidemiological information. If result is POSITIVE SARS-CoV-2 target nucleic acids are DETECTED. The SARS-CoV-2 RNA is generally detectable in upper and lower  respiratory specimens dur ing the acute phase of infection.  Positive  results are indicative of active infection with SARS-CoV-2.  Clinical  correlation with patient history and other diagnostic information is  necessary to determine patient infection status.  Positive results do  not rule out bacterial infection or co-infection with other  viruses. If result is PRESUMPTIVE POSTIVE SARS-CoV-2 nucleic acids MAY BE PRESENT.   A presumptive positive result was obtained on the submitted specimen  and confirmed on repeat testing.  While 2019 novel coronavirus  (SARS-CoV-2) nucleic acids may be present in the submitted sample  additional confirmatory testing may be necessary for epidemiological  and / or clinical management purposes  to differentiate between  SARS-CoV-2 and other Sarbecovirus currently known to infect humans.  If clinically indicated additional testing with an alternate test  methodology [phone]) is advised. The SARS-CoV-2 RNA is generally  detectable in upper and lower respiratory sp ecimens during the acute  phase of infection. The expected result is Negative. Fact Sheet for Patients:  StrictlyIdeas.no Fact Sheet for Healthcare Providers: BankingDealers.co.za This test is not yet approved or cleared by the [place] FDA and has been authorized for detection and/or diagnosis of SARS-CoV-2 by FDA under an Emergency Use Authorization (EUA).  This EUA will remain in effect (meaning this test can be used) for the duration of the COVID-19 declaration under Section 564(b)(1) of the Act,  21 U.S.C. section 360bbb-3(b)(1), unless the authorization is terminated or revoked sooner. Performed at [facility], [place] [address] [place], [place] [address]   MRSA PCR Screening     Status: None   Collection Time: 10/06/19 10:40 PM   Specimen: Nasopharyngeal  Result Value Ref Range Status   MRSA by PCR NEGATIVE NEGATIVE Final    Comment:        The GeneXpert MRSA Assay (FDA approved for NASAL specimens only), is one component of a comprehensive MRSA colonization surveillance program. It is not intended to diagnose MRSA infection nor to guide or monitor treatment for MRSA infections. Performed at [facility], [place] [address], [place], [place] [address]          Radiology Studies: No results found.      Scheduled Meds: . buPROPion  150 mg Oral BID  . Chlorhexidine Gluconate Cloth  6 each Topical Q0600  . darbepoetin (ARANESP) injection - DIALYSIS  100 mcg Intravenous Q Thu-HD  . ferrous sulfate  325 mg Oral BID WC  . gabapentin  300 mg Oral Daily  . heparin      . insulin aspart  0-15 Units Subcutaneous TID WC  . pantoprazole  40 mg Oral Daily   Continuous Infusions:   LOS: 3 days    Time spent: 40-45 min    Ezekiel Slocumb, MD [facility] Pager [phone]  If 7PM-7AM, please contact night-coverage [email or web address] Password [facility] 10/08/2019, 7:43 PM

## 2019-10-08 NOTE — TOC Initial Note (Signed)
Transition of Care [facility]) - Initial/Assessment Note    Patient Details  Name: [name] [name] MRN: [id number] Date of Birth: [birth year]  [facility]) CM/SW Contact:    Bary Castilla, LCSW Phone Number: [phone] 10/08/2019, 3:35 PM  Clinical Narrative:                  CSW met with patient to discuss PT recommendation of a SNF. Patient was aware of recommendation and in agreement with going to a ST SNF. CSW discussed the SNF process. Patient gave CSW permission to fax referrals out to local facilities. Patient informed CSW that she did not want to go to [facility] due to being there before. Patient was given the medicare.gov facility rating list. l Patient also stated that the facility she goes to will have to accommodate her dialysis Tuesday, Thursday and Saturdays. CSW informed patient that it would be noted.  CSW will continue to follow for discharge planning.  Expected Discharge Plan: Skilled Nursing Facility Barriers to Discharge: Continued Medical Work up, SNF Pending bed offer   Patient Goals and CMS Choice Patient states their goals for this hospitalization and ongoing recovery are:: To get better and go home CMS Medicare.gov Compare Post Acute Care list provided to:: Patient Choice offered to / list presented to : Patient  Expected Discharge Plan and Services Expected Discharge Plan: [place] arrangements for the past 2 months: Apartment                                      Prior Living Arrangements/Services Living arrangements for the past 2 months: Apartment Lives with:: Self, Significant Other Patient language and need for interpreter reviewed:: Yes                 Activities of Daily Living      Permission Sought/Granted   Permission granted to share information with : Yes, Verbal Permission Granted  Share Information with NAME: [name] [name]  Permission granted to share info w AGENCY: SNFs  Permission granted to  share info w Relationship: significant other  Permission granted to share info w Contact Information: [phone]  Emotional Assessment Appearance:: Appears stated age Attitude/Demeanor/Rapport: Engaged Affect (typically observed): Accepting Orientation: : Oriented to Self, Oriented to Place, Oriented to  Time, Oriented to Situation      Admission diagnosis:  Hyperkalemia [E87.5] Upper GI bleed [K92.2] Patient Active Problem List   Diagnosis Date Noted  . Acute GI bleeding 10/05/2019  . Hyperkalemia   . Depression   . Esophageal stricture   . Diabetic gastroparesis ([place])   . Upper GI bleed 08/16/2019  . Hiatal hernia   . Iron deficiency anemia   . Microcytic anemia 02/05/2016  . Acute on chronic renal failure ([place])   . CAD in native artery   . Substance abuse ([place])   . Chronic combined systolic and diastolic CHF (congestive heart failure) ([place])   . ESRD (end stage renal disease) ([place])   . Obesity, Class III, BMI 40-49.9 (morbid obesity) ([place])   . Type 2 diabetes mellitus with chronic kidney disease on chronic dialysis, with long-term current use of insulin ([place]) 10/18/2014  . S/P CABG (coronary artery bypass graft) 09/21/2013  . Arthritis of right shoulder region 03/23/2013  . Tobacco abuse   . Coronary artery disease   . NSTEMI (non-ST  elevated myocardial infarction) ([place]) 06/29/2012  . History of cocaine abuse ([place]) 06/29/2012  . Essential hypertension 06/29/2012  . Glaucoma   . Hyperlipidemia   . History of CVA (cerebrovascular accident)    PCP:  [facility] Pharmacy:   [facility] [address] [place] [address] Phone: [phone] Fax: [place], [place] - [address] [place] [address] Phone: [phone] Fax: [phone]  [facility] [place], [place] [address] [place] [address] Phone: [phone] Fax: [phone]     Social Determinants of Health (SDOH) Interventions    Readmission Risk Interventions Readmission Risk Prevention Plan 10/08/2019 08/26/2019 06/03/2019  Transportation Screening - Complete -  Medication Review (RN Care Manager) - Complete -  PCP or Specialist appointment within 3-5 days of discharge - Complete Complete  HRI or Home Care Consult - Complete -  SW Recovery Care/Counseling Consult - Complete -  Palliative Care Screening Not Applicable Not Applicable -  [place] - Not Applicable -  Some recent data might be hidden

## 2019-10-08 NOTE — Progress Notes (Signed)
  [facility] Progress Note   Assessment/ Plan:    Dialysis orders: [facility] TTS 4 hr 15 min EDW 121 kg F 180 dialyzer 2K/ 2 Ca bath BFR 400 via TDC, maturing AVF No profile   Mircera 100 q 2 weeks, to start 10/14, micera 30 given 10/1 Calcitriol 0.25 mcg q rx No heparin No binders listed   1 Weakness/ falls: likely blood loss anemia, orthostatics, per primary 2.  Heme + stool, with iron def anemia, s/p 1 u PRBCs with appropriate bump 10/8, enteroscopy with no source of bleeding, c-scope not performed d/t poor prep, no further scoping this admission.  Hgb dropped again this AM, 1 u pRBCs ordered.    3 ESRD: TTS- provide HD on sched- 10/10 4 Hypertension: pressures soft, would hold antihypertensives 5. Anemia of ESRD: Hgb down to 7.0, got prbcs, will increase ESA to Aranesp 100 q week and follow, adding on iron panel too 6. 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  Dispo: pending  Subjective:    Seen and examined on HD.  BFR 400 mL/ min via TDC, UF goal 3L.  Treatment supervised.  Tolerating well.  Hgb down to 7.0 this AM.  1 u pRBCs ordered.    Objective:   BP 127/64   Pulse 71   Temp 98.7 F (37.1 C) (Oral)   Resp 14   Wt 126 kg   SpO2 95%   BMI 42.24 kg/m   Physical Exam: GEN older woman, lying in bed, NAD HEENT wearing glasses NECK no JVD PULM clear bilaterally no c/w/r CV RRR no m/r/g ABD soft, obese, mildly tender in LUQ EXT 1+ LE edema NEURO AAO x 3 SKIN no rashes MSK no effusions ACCESS TDC in place, dressing c/d/i, LUE AVF + T/B, maturing nicely  Labs: BMET Recent Labs  Lab 10/04/19 0828 10/05/19 1855 10/06/19 0021 10/07/19 1327 10/07/19 1827  NA 140 140 139  --  136  K 4.7 6.2* 5.3*  --  4.1  CL 97* 102 101  --  96*  CO2 26 23 23   --  25  GLUCOSE 137* 116* 194*  --  142*  BUN 51* 89* 93*  --  46*  CREATININE 8.87* 10.78* 11.41*  --  6.78*  CALCIUM 8.3* 8.0*  7.7*  --  8.2*  PHOS  --   --   --  5.8*  --    CBC Recent Labs  Lab 10/04/19 0828 10/05/19 1855 10/06/19 0021 10/06/19 1850 10/08/19 0434  WBC 10.9* 8.2  --  8.1 5.8  NEUTROABS  --  5.5  --   --  3.9  HGB 7.8* 7.0*  --  8.4* 7.0*  HCT 26.7* 23.9* 22.0* 26.0* 21.8*  MCV 94.3 94.8  --  89.0 89.7  PLT 355 340  --  266 255    @IMGRELPRIORS @ Medications:    . sodium chloride   Intravenous Once  . buPROPion  150 mg Oral BID  . Chlorhexidine Gluconate Cloth  6 each Topical Q0600  . darbepoetin (ARANESP) injection - DIALYSIS  100 mcg Intravenous Q Thu-HD  . ferrous sulfate  325 mg Oral BID WC  . gabapentin  300 mg Oral Daily  . insulin aspart  0-15 Units Subcutaneous TID WC  . pantoprazole  40 mg Oral Daily     Madelon Lips MD 10/08/2019, 9:27 AM

## 2019-10-09 DIAGNOSIS — R531 Weakness: Secondary | ICD-10-CM

## 2019-10-09 DIAGNOSIS — R296 Repeated falls: Secondary | ICD-10-CM

## 2019-10-09 DIAGNOSIS — I10 Essential (primary) hypertension: Secondary | ICD-10-CM

## 2019-10-09 LAB — TYPE AND SCREEN
ABO/RH(D): O POS
Antibody Screen: NEGATIVE
Unit division: 0

## 2019-10-09 LAB — BASIC METABOLIC PANEL
Anion gap: 14 (ref 5–15)
BUN: 31 mg/dL — ABNORMAL HIGH (ref 8–23)
CO2: 25 mmol/L (ref 22–32)
Calcium: 8.2 mg/dL — ABNORMAL LOW (ref 8.9–10.3)
Chloride: 100 mmol/L (ref 98–111)
Creatinine, Ser: 5.11 mg/dL — ABNORMAL HIGH (ref 0.44–1.00)
GFR calc Af Amer: 9 mL/min — ABNORMAL LOW (ref >60)
GFR calc non Af Amer: 8 mL/min — ABNORMAL LOW (ref >60)
Glucose, Bld: 130 mg/dL — ABNORMAL HIGH (ref 70–99)
Potassium: 4 mmol/L (ref 3.5–5.1)
Sodium: 139 mmol/L (ref 135–145)

## 2019-10-09 LAB — BPAM RBC
Blood Product Expiration Date: 202011122359
ISSUE DATE / TIME: 202010101041
Unit Type and Rh: 5100

## 2019-10-09 LAB — GLUCOSE, CAPILLARY
Glucose-Capillary: 118 mg/dL — ABNORMAL HIGH (ref 70–99)
Glucose-Capillary: 207 mg/dL — ABNORMAL HIGH (ref 70–99)
Glucose-Capillary: 116 mg/dL — ABNORMAL HIGH (ref 70–99)
Glucose-Capillary: 155 mg/dL — ABNORMAL HIGH (ref 70–99)

## 2019-10-09 LAB — CBC
HCT: 26.9 % — ABNORMAL LOW (ref 36.0–46.0)
Hemoglobin: 8.5 g/dL — ABNORMAL LOW (ref 12.0–15.0)
MCH: 28.9 pg (ref 26.0–34.0)
MCHC: 31.6 g/dL (ref 30.0–36.0)
MCV: 91.5 fL (ref 80.0–100.0)
Platelets: 248 10*3/uL (ref 150–400)
RBC: 2.94 MIL/uL — ABNORMAL LOW (ref 3.87–5.11)
RDW: 18.3 % — ABNORMAL HIGH (ref 11.5–15.5)
WBC: 6 10*3/uL (ref 4.0–10.5)
nRBC: 0.7 % — ABNORMAL HIGH (ref 0.0–0.2)

## 2019-10-09 LAB — PHOSPHORUS: Phosphorus: 4.1 mg/dL (ref 2.5–4.6)

## 2019-10-09 LAB — MAGNESIUM: Magnesium: 1.8 mg/dL (ref 1.7–2.4)

## 2019-10-09 MED ORDER — HYDROMORPHONE HCL 2 MG PO TABS
1.0000 mg | ORAL_TABLET | ORAL | Status: DC | PRN
  Administered 2019-10-09 – 2019-10-10 (×2): 1 mg via ORAL
  Filled 2019-10-09 (×2): qty 1

## 2019-10-09 MED ORDER — MAGNESIUM SULFATE 2 GM/50ML IV SOLN
2.0000 g | Freq: Once | INTRAVENOUS | Status: AC
  Administered 2019-10-09: 2 g via INTRAVENOUS
  Filled 2019-10-09: qty 50

## 2019-10-09 MED ORDER — ACETAMINOPHEN 325 MG PO TABS
650.0000 mg | ORAL_TABLET | Freq: Four times a day (QID) | ORAL | Status: DC | PRN
  Administered 2019-10-09: 650 mg via ORAL
  Filled 2019-10-09: qty 2

## 2019-10-09 MED ORDER — SODIUM CHLORIDE 0.9 % IV SOLN
125.0000 mg | INTRAVENOUS | Status: DC
  Administered 2019-10-11: 125 mg via INTRAVENOUS
  Filled 2019-10-09 (×2): qty 10

## 2019-10-09 NOTE — Progress Notes (Signed)
[facility] Progress Note   Subjective:   Patient seen and examined at bedside.  States she is feeling well today.  Admits to maroon colored stool yesterday.  Denies n/v/d, SOB, CP, abdominal pain, dizziness, weakness and fatigue.   Objective Vitals:   10/08/19 1255 10/08/19 1930 10/08/19 2108 10/09/19 0606  BP: 92/76  (!) 157/56 95/72  Pulse:  64 63 62  Resp:   20   Temp:   100.2 F (37.9 C) 98.7 F (37.1 C)  TempSrc:   Oral Oral  SpO2:  99% 96% 95%  Weight:    123.9 kg   Physical Exam General:NAD, female, laying in bed  Heart:RRR, no mrg Lungs:CTAB Abdomen:soft, NTND Extremities:trace LE edema Dialysis Access: TDC, LU AVF +b/t   Filed Weights   10/08/19 0825 10/08/19 1145 10/09/19 0606  Weight: 126 kg 124.5 kg 123.9 kg    Intake/Output Summary (Last 24 hours) at 10/09/2019 1140 Last data filed at 10/08/2019 1800 Gross per 24 hour  Intake 715 ml  Output 2000 ml  Net -1285 ml    Additional Objective Labs: Basic Metabolic Panel: Recent Labs  Lab 10/06/19 0021 10/07/19 1327 10/07/19 1827 10/08/19 1714 10/09/19 0456  NA 139  --  136  --  139  K 5.3*  --  4.1 3.8 4.0  CL 101  --  96*  --  100  CO2 23  --  25  --  25  GLUCOSE 194*  --  142*  --  130*  BUN 93*  --  46*  --  31*  CREATININE 11.41*  --  6.78*  --  5.11*  CALCIUM 7.7*  --  8.2*  --  8.2*  PHOS  --  5.8*  --   --   --    Liver Function Tests: Recent Labs  Lab 10/05/19 1855  AST 15  ALT 14  ALKPHOS 83  BILITOT 0.6  PROT 6.7  ALBUMIN 3.2*   Recent Labs  Lab 10/05/19 1855  LIPASE 35   CBC: Recent Labs  Lab 10/04/19 0828 10/05/19 1855  10/06/19 1850 10/08/19 0434 10/08/19 2031 10/09/19 0456  WBC 10.9* 8.2  --  8.1 5.8  --  6.0  NEUTROABS  --  5.5  --   --  3.9  --   --   HGB 7.8* 7.0*  --  8.4* 7.0* 8.5* 8.5*  HCT 26.7* 23.9*   < > 26.0* 21.8* 26.5* 26.9*  MCV 94.3 94.8  --  89.0 89.7  --  91.5  PLT 355 340  --  266 255  --  248   < > = values in this  interval not displayed.   CBG: Recent Labs  Lab 10/08/19 1244 10/08/19 1534 10/08/19 2111 10/09/19 0808 10/09/19 1111  GLUCAP 142* 170* 123* 118* 207*   Iron Studies:  Recent Labs    10/08/19 1010  IRON 33  TIBC 216*  FERRITIN 222   Medications:  . buPROPion  150 mg Oral BID  . Chlorhexidine Gluconate Cloth  6 each Topical Q0600  . darbepoetin (ARANESP) injection - DIALYSIS  100 mcg Intravenous Q Thu-HD  . ferrous sulfate  325 mg Oral BID WC  . gabapentin  300 mg Oral Daily  . insulin aspart  0-15 Units Subcutaneous TID WC  . metoprolol tartrate  12.5 mg Oral BID  . pantoprazole  40 mg Oral Daily    Dialysis Orders: [facility] TTS  4 hr 15 min EDW 121 kg F  180 dialyzer 2K/ 2 Ca bath BFR 400 via TDC, maturing AVF No profile   Mircera 100 q 2 weeks, to start 10/14, micera 30 given 10/1 Calcitriol 0.25 mcg q rx No heparin No binders listed  Assessment/Plan: 1. Weakness/falls - likely 2/2 ABLA, orthostatics VS ok.  2. New onset A fib/flutter - noted on tele yesterday, rate controlled. No anticoagulation for now 2/2 GIB. Starting low dose BB.  Cardiology consult.  3. Acute GIB - heme +stool, enteroscopy w/no source of bleeding, colonoscopy not completed b/c pt did not tolerated prep. S/p 2 units pRBC this admission.  4. Hyperkalemia - resolved. K 4.0.  5. ESRD - on HD TTS. HD yesterday tolerated well. Next HD 10/13. 6. Anemia of CKD- Hgb 8.5 s/p 2units pRBC this admission. aranesp ^142mcg qwk. Tsat 15%, start Fe load.  7. Secondary hyperparathyroidism - Ca at goal, phos elevated. Will recheck today, if remains elevated need to start binder. Continue VDRA. Not on binders.  8. HTN/volume - BP soft, antihypertensives on hold.  9. Nutrition - Renal diet w/fluid restrictions.  10. CAD s/p CABG 11. Hx diastolic/systolic HF 12. Hx CVA 13. DMT2  Jen Mow, PA-C [facility] Pager: [phone] 10/09/2019,11:40 AM  LOS: 4 days

## 2019-10-09 NOTE — Progress Notes (Signed)
Patient requested pain medication for a 7/10 sharp pain in right scapular area. Reported falling a few days ago at home in her bathroom and hitting this area on her bathtub. Stated pain was not very bad before but suddenly it has become very painful. Page text to Dr. Arbutus Ped.

## 2019-10-09 NOTE — Progress Notes (Addendum)
PROGRESS NOTE    [name] [name]  IRC:[id number] DOB: 1951/01/05 DOA: 10/05/2019 PCP: [facility]    Brief Narrative:  68 y.o.femalewith medical history significant ofCAD status post CABG, end-stage renal disease on dialysisT/Th/Sat,mixeddiastolic/systolic congestive heart failure, CVA, history ofhemoccult stool and anemia, type 2 diabetes who presenteds/p dizziness leading to a fall.Regardingfalls, she states that she has been feeling dizzy and feeling unsteady even with her cane when ambulating, and had fallen three times in past three days. PerED documentation, patient was [facility](10/[facility])for fall without any acute findings. Orthostatic vital signswerenormal at that time.Hemoglobin was 7.8, but on presentation today (10/7) Hbg is down to 7 (was 9.3 one month ago). Fecal occult blood positive and black tarry stool noted by EDP. Regardingher anemia, she states she has had 3 days of dark stools with no bright red blood per rectum. She also reports that she has missed dialysis for this entire week. She denies any chest pain, palpitations shortness of breath. Denies any nausea, vomiting, abdominal pain.Denies use of NSAID's. In the ED, pt noted to desaturate to low 80's with ambulation, required 3 L/min O2. EDP discussed case with gastroenterologist who recommended IV PPI and NPOpast midnight. Nephrology also consulted and recommended 2 doses of Lokelma and an amp of bicarb. Nephrologyreportedly felt reasonable to give her a unit of blood although cautioned increase potassium.Patient was transferred to [facility] and underwent dialysis today, 10/8. GI is following, indicate similar issues in August 2020. Had EGD at that time that did not identify any evidence of active bleeding. Also has history of nonbleeding AVM's noted on prior colonoscopy in 2017.  Patient underwent EGD 10/9 which showed a moderate hiatal hernia with a small  non-bleeding Cameron erosion. No active bleeding was seen. Patient likely to have chronic/intermittent blood loss, and possibly become transfusion dependent, per GI. Patient did not tolerate bowel prep, therefore colonoscopy was not performed.    Assessment & Plan:   Principal Problem:   Acute GI bleeding Active Problems:   Unspecified atrial fibrillation (HCC)   Type 2 diabetes mellitus with chronic kidney disease on chronic dialysis, with long-term current use of insulin (HCC)   Chronic combined systolic and diastolic CHF (congestive heart failure) (HCC)   ESRD (end stage renal disease) (HCC)   Iron deficiency anemia   Hyperlipidemia   History of CVA (cerebrovascular accident)   Essential hypertension   Coronary artery disease   S/P CABG (coronary artery bypass graft)   Depression  New Onset A-fib/Flutter: seen on tele today, rate controlled.  No current/previous diagnosis known. - Given anemia and recent GI bleeding requiring transfusion will hold off on anticoagulation at this time - We will start low-dose beta-blocker with hold parameters -cardiology consult  Acute GI Bleeding:likely has chronic blood loss as well.  Source likely [name] erosion noted on EGD 10/9 vs previously noted colon AVM (seen in 2017) vs GI malignancy.Colonoscopy unable to be performed as patient did not tolerate bowel prep. Pt remains hemodynamically stable. Heme positive stool and dark tarry stool noted by EDP. Patient is followed by GI, has history of iron deficiency anemia and heme positive stool. Per GI outpatient documentation, patient had plans for repair of her AVM but unsure whether patient actually followed up. Status post 2 units packed red cells.  H/H currently stable. -Continue to hold aspirin - GIsigned off -resume home oral PPI  Acute on Chronic Iron Deficiency Anemia: secondary toGI blood loss, asabove. Hbg 7.0on admission,from 9.3  one month ago, in setting of above. S/p  2 unit pRBC's. - per GI, transfuse as needed, TARGET Hbg >= 8 - monitorHbg &transfuse as needed  - iron repletion per nephro  Recurrent falls Generalized Weakness: suspect secondary to acute anemiaand possibly metabolic derangements frommissed dialysis on top of chronic deconditioning. Pt uses cane for ambulation at baseline.  Orthostatics negative x2. hostatic vitals - PT/OT following - planning for dc to rehab   Hyperkalemia - resolved:in setting of end-stage renal disease: recived Lokelma x2 and 1 amp bicarb in ED. No ECK changes noted. Patient had missed a week of dialysis prior to presentation. Underwent dialysis 10/8, 10/0.  - monitor BMP daily  End Stage Renal Disease: on hemodialysis (T/Th/Sat): - Dialysis per nephro  CAD status post CABG: chronic, stable.  -aspirin on hold due to GI bleed - statin on hold while NPO  History of diastolic and systolic congestive heart failure: chronic, stable  Hypertension: chronic -Amlodipineheldforsoftblood pressures  hX CVA: -Hold aspirin secondary to GI bleed  Type 2 diabeteswith peripheral neuropathy -Normally takes 2 units 3 times daily with meals -Low-dose sliding scaleinsulin -Continue gabapentin when able to take p.o.  Depression -Continue Wellbutrin  COPD: chronic, not acutely exacerbated - continue Dulera, Atrovent   DVT prophylaxis:SCD's Code Status:Full code Family Communication:none at bedside Disposition Plan:to rehab, hopefully Monday   Consultants:  Gastroenterology  Procedures:  EGD 10/07/19, findings as above  Antimicrobials:  None  Subjective: Patient seen and examined.  States she noticed some specks of blood in her stool stool yesterday.  Feels generally weak.  Denies other complaints including fever, chills, abdominal pain, dizziness, lightheadedness, chest pain, shortness of breath.  Objective: Vitals:   10/08/19 1255 10/08/19 1930  10/08/19 2108 10/09/19 0606  BP: 92/76  (!) 157/56 95/72  Pulse:  64 63 62  Resp:   20   Temp:   100.2 F (37.9 C) 98.7 F (37.1 C)  TempSrc:   Oral Oral  SpO2:  99% 96% 95%  Weight:    123.9 kg    Intake/Output Summary (Last 24 hours) at 10/09/2019 1500 Last data filed at 10/08/2019 1800 Gross per 24 hour  Intake 200 ml  Output --  Net 200 ml   Filed Weights   10/08/19 0825 10/08/19 1145 10/09/19 0606  Weight: 126 kg 124.5 kg 123.9 kg    Examination:  General exam: Appears calm and comfortable  Respiratory system: Clear to auscultation. Respiratory effort normal. Cardiovascular system: S1 & S2 heard, RRR. No JVD, murmurs, rubs, gallops or clicks. No pedal edema. Gastrointestinal system: Abdomen is nondistended, soft and nontender. No organomegaly or masses felt. Normal bowel sounds heard. Central nervous system: Alert and oriented. No focal neurological deficits. Extremities: Symmetric 5 x 5 power. Skin: No rashes, lesions or ulcers Psychiatry: Judgement and insight appear normal. Mood & affect appropriate.     Data Reviewed: I have personally reviewed following labs and imaging studies  CBC: Recent Labs  Lab 10/04/19 0828 10/05/19 1855 10/06/19 0021 10/06/19 1850 10/08/19 0434 10/08/19 2031 10/09/19 0456  WBC 10.9* 8.2  --  8.1 5.8  --  6.0  NEUTROABS  --  5.5  --   --  3.9  --   --   HGB 7.8* 7.0*  --  8.4* 7.0* 8.5* 8.5*  HCT 26.7* 23.9* 22.0* 26.0* 21.8* 26.5* 26.9*  MCV 94.3 94.8  --  89.0 89.7  --  91.5  PLT 355 340  --  266 255  --  454   Basic Metabolic Panel: Recent Labs  Lab 10/04/19 0828 10/05/19 1855 10/06/19 0021 10/07/19 1327 10/07/19 1827 10/08/19 1714 10/09/19 0456  NA 140 140 139  --  136  --  139  K 4.7 6.2* 5.3*  --  4.1 3.8 4.0  CL 97* 102 101  --  96*  --  100  CO2 26 23 23   --  25  --  25  GLUCOSE 137* 116* 194*  --  142*  --  130*  BUN 51* 89* 93*  --  46*  --  31*  CREATININE 8.87* 10.78* 11.41*  --  6.78*  --  5.11*   CALCIUM 8.3* 8.0* 7.7*  --  8.2*  --  8.2*  MG  --   --   --   --   --  1.7 1.8  PHOS  --   --   --  5.8*  --   --  4.1   GFR: Estimated Creatinine Clearance: 14.6 mL/min (A) (by C-G formula based on SCr of 5.11 mg/dL (H)). Liver Function Tests: Recent Labs  Lab 10/05/19 1855  AST 15  ALT 14  ALKPHOS 83  BILITOT 0.6  PROT 6.7  ALBUMIN 3.2*   Recent Labs  Lab 10/05/19 1855  LIPASE 35   No results for input(s): AMMONIA in the last 168 hours. Coagulation Profile: No results for input(s): INR, PROTIME in the last 168 hours. Cardiac Enzymes: No results for input(s): CKTOTAL, CKMB, CKMBINDEX, TROPONINI in the last 168 hours. BNP (last 3 results) No results for input(s): PROBNP in the last 8760 hours. HbA1C: No results for input(s): HGBA1C in the last 72 hours. CBG: Recent Labs  Lab 10/08/19 1244 10/08/19 1534 10/08/19 2111 10/09/19 0808 10/09/19 1111  GLUCAP 142* 170* 123* 118* 207*   Lipid Profile: No results for input(s): CHOL, HDL, LDLCALC, TRIG, CHOLHDL, LDLDIRECT in the last 72 hours. Thyroid Function Tests: No results for input(s): TSH, T4TOTAL, FREET4, T3FREE, THYROIDAB in the last 72 hours. Anemia Panel: Recent Labs    10/08/19 1010  FERRITIN 222  TIBC 216*  IRON 33   Sepsis Labs: No results for input(s): PROCALCITON, LATICACIDVEN in the last 168 hours.  Recent Results (from the past 240 hour(s))  SARS Coronavirus 2 [facility] order, Performed in [facility] hospital lab) Nasopharyngeal Nasopharyngeal Swab     Status: None   Collection Time: 10/05/19  8:46 PM   Specimen: Nasopharyngeal Swab  Result Value Ref Range Status   SARS Coronavirus 2 NEGATIVE NEGATIVE Final    Comment: (NOTE) If result is NEGATIVE SARS-CoV-2 target nucleic acids are NOT DETECTED. The SARS-CoV-2 RNA is generally detectable in upper and lower  respiratory specimens during the acute phase of infection. The lowest  concentration of SARS-CoV-2 viral copies this assay can detect  is 250  copies / mL. A negative result does not preclude SARS-CoV-2 infection  and should not be used as the sole basis for treatment or other  patient management decisions.  A negative result may occur with  improper specimen collection / handling, submission of specimen other  than nasopharyngeal swab, presence of viral mutation(s) within the  areas targeted by this assay, and inadequate number of viral copies  (<250 copies / mL). A negative result must be combined with clinical  observations, patient history, and epidemiological information. If result is POSITIVE SARS-CoV-2 target nucleic acids are DETECTED. The SARS-CoV-2 RNA is generally detectable in upper and lower  respiratory specimens dur ing the acute  phase of infection.  Positive  results are indicative of active infection with SARS-CoV-2.  Clinical  correlation with patient history and other diagnostic information is  necessary to determine patient infection status.  Positive results do  not rule out bacterial infection or co-infection with other viruses. If result is PRESUMPTIVE POSTIVE SARS-CoV-2 nucleic acids MAY BE PRESENT.   A presumptive positive result was obtained on the submitted specimen  and confirmed on repeat testing.  While 2019 novel coronavirus  (SARS-CoV-2) nucleic acids may be present in the submitted sample  additional confirmatory testing may be necessary for epidemiological  and / or clinical management purposes  to differentiate between  SARS-CoV-2 and other Sarbecovirus currently known to infect humans.  If clinically indicated additional testing with an alternate test  methodology [phone]) is advised. The SARS-CoV-2 RNA is generally  detectable in upper and lower respiratory sp ecimens during the acute  phase of infection. The expected result is Negative. Fact Sheet for Patients:  StrictlyIdeas.no Fact Sheet for Healthcare  Providers: BankingDealers.co.za This test is not yet approved or cleared by the [place] FDA and has been authorized for detection and/or diagnosis of SARS-CoV-2 by FDA under an Emergency Use Authorization (EUA).  This EUA will remain in effect (meaning this test can be used) for the duration of the COVID-19 declaration under Section 564(b)(1) of the Act, 21 U.S.C. section 360bbb-3(b)(1), unless the authorization is terminated or revoked sooner. Performed at [facility], [place] [address], [place], [place] [address]   MRSA PCR Screening     Status: None   Collection Time: 10/06/19 10:40 PM   Specimen: Nasopharyngeal  Result Value Ref Range Status   MRSA by PCR NEGATIVE NEGATIVE Final    Comment:        The GeneXpert MRSA Assay (FDA approved for NASAL specimens only), is one component of a comprehensive MRSA colonization surveillance program. It is not intended to diagnose MRSA infection nor to guide or monitor treatment for MRSA infections. Performed at [facility], [place] [address] [place],  [address]          Radiology Studies: No results found.      Scheduled Meds:  buPROPion  150 mg Oral BID   Chlorhexidine Gluconate Cloth  6 each Topical Q0600   darbepoetin (ARANESP) injection - DIALYSIS  100 mcg Intravenous Q Thu-HD   gabapentin  300 mg Oral Daily   insulin aspart  0-15 Units Subcutaneous TID WC   metoprolol tartrate  12.5 mg Oral BID   pantoprazole  40 mg Oral Daily   Continuous Infusions:  [START ON 10/11/2019] ferric gluconate (FERRLECIT/NULECIT) IV       LOS: 4 days    Time spent: 40 to 45 minutes    Ezekiel Slocumb, MD [facility] Pager [phone]  If 7PM-7AM, please contact night-coverage [email or web address] Password [email or web address] 10/09/2019, 3:00 PM

## 2019-10-10 ENCOUNTER — Encounter (HOSPITAL_COMMUNITY): Payer: Self-pay | Admitting: Gastroenterology

## 2019-10-10 LAB — BASIC METABOLIC PANEL
Anion gap: 17 — ABNORMAL HIGH (ref 5–15)
BUN: 41 mg/dL — ABNORMAL HIGH (ref 8–23)
CO2: 23 mmol/L (ref 22–32)
Calcium: 8.6 mg/dL — ABNORMAL LOW (ref 8.9–10.3)
Chloride: 99 mmol/L (ref 98–111)
Creatinine, Ser: 7.06 mg/dL — ABNORMAL HIGH (ref 0.44–1.00)
GFR calc Af Amer: 6 mL/min — ABNORMAL LOW (ref >60)
GFR calc non Af Amer: 5 mL/min — ABNORMAL LOW (ref >60)
Glucose, Bld: 157 mg/dL — ABNORMAL HIGH (ref 70–99)
Potassium: 3.8 mmol/L (ref 3.5–5.1)
Sodium: 139 mmol/L (ref 135–145)

## 2019-10-10 LAB — PHOSPHORUS: Phosphorus: 5.2 mg/dL — ABNORMAL HIGH (ref 2.5–4.6)

## 2019-10-10 LAB — CBC
HCT: 27.2 % — ABNORMAL LOW (ref 36.0–46.0)
Hemoglobin: 8.6 g/dL — ABNORMAL LOW (ref 12.0–15.0)
MCH: 29.2 pg (ref 26.0–34.0)
MCHC: 31.6 g/dL (ref 30.0–36.0)
MCV: 92.2 fL (ref 80.0–100.0)
Platelets: 271 10*3/uL (ref 150–400)
RBC: 2.95 MIL/uL — ABNORMAL LOW (ref 3.87–5.11)
RDW: 18.7 % — ABNORMAL HIGH (ref 11.5–15.5)
WBC: 6.9 10*3/uL (ref 4.0–10.5)
nRBC: 0.3 % — ABNORMAL HIGH (ref 0.0–0.2)

## 2019-10-10 LAB — GLUCOSE, CAPILLARY
Glucose-Capillary: 138 mg/dL — ABNORMAL HIGH (ref 70–99)
Glucose-Capillary: 192 mg/dL — ABNORMAL HIGH (ref 70–99)
Glucose-Capillary: 173 mg/dL — ABNORMAL HIGH (ref 70–99)
Glucose-Capillary: 164 mg/dL — ABNORMAL HIGH (ref 70–99)

## 2019-10-10 LAB — SARS CORONAVIRUS 2 (TAT 6-24 HRS): SARS Coronavirus 2: NEGATIVE

## 2019-10-10 LAB — MAGNESIUM: Magnesium: 2.2 mg/dL (ref 1.7–2.4)

## 2019-10-10 LAB — ALBUMIN: Albumin: 2.7 g/dL — ABNORMAL LOW (ref 3.5–5.0)

## 2019-10-10 MED ORDER — CHLORHEXIDINE GLUCONATE CLOTH 2 % EX PADS: 6.0000 | MEDICATED_PAD | Freq: Every day | CUTANEOUS | Status: DC

## 2019-10-10 NOTE — Progress Notes (Signed)
Physical Therapy Treatment Patient Details Name: [name] [name] MRN: [id number] DOB: October 16, 1951 Today's Date: 10/10/2019    History of Present Illness Pt adm with GI bleed. Marland KitchenPMH to include DM, CAD s/p CABG, CKD, HF, CVA, ESRD on HD    PT Comments    Pt making steady progress. Continue to recommend ST-SNF   Follow Up Recommendations  SNF;Supervision/Assistance - 24 hour     Equipment Recommendations  Other (comment)(rolling walker vs rollator)    Recommendations for Other Services       Precautions / Restrictions Precautions Precautions: Fall    Mobility  Bed Mobility Overal bed mobility: Needs Assistance Bed Mobility: Supine to Sit     Supine to sit: HOB elevated;Min guard     General bed mobility comments: Assist for safety and lines  Transfers Overall transfer level: Needs assistance Equipment used: Rolling walker (2 wheeled) Transfers: Sit to/from [employer] Transfers Sit to Stand: +2 safety/equipment;Min assist         General transfer comment: Assist to bring hips up and for balance. Verbal cues for hand placement  Ambulation/Gait Ambulation/Gait assistance: Min assist;+2 safety/equipment Gait Distance (Feet): 45 Feet(10', 25', 45,) Assistive device: Rolling walker (2 wheeled) Gait Pattern/deviations: Step-through pattern;Decreased step length - right;Decreased step length - left;Trunk flexed Gait velocity: decr Gait velocity interpretation: <1.31 ft/sec, indicative of household ambulator General Gait Details: Assist for balance and support   Stairs             Wheelchair Mobility    Modified Rankin (Stroke Patients Only)       Balance Overall balance assessment: Needs assistance Sitting-balance support: Feet supported;No upper extremity supported Sitting balance-Leahy Scale: Fair     Standing balance support: Bilateral upper extremity supported Standing balance-Leahy Scale: Poor Standing balance comment: walker and min guard  assist for static standing                            Cognition Arousal/Alertness: Awake/alert Behavior During Therapy: WFL for tasks assessed/performed Overall Cognitive Status: Within Functional Limits for tasks assessed                                        Exercises      General Comments        Pertinent Vitals/Pain Pain Assessment: Faces Faces Pain Scale: Hurts little more Pain Location: rt rib cage Pain Descriptors / Indicators: Grimacing Pain Intervention(s): Limited activity within patient's tolerance;Monitored during session;Repositioned    Home Living                      Prior Function            PT Goals (current goals can now be found in the care plan section) Acute Rehab PT Goals Patient Stated Goal: stop falling Progress towards PT goals: Progressing toward goals    Frequency    Min 3X/week      PT Plan Current plan remains appropriate    Co-evaluation              AM-PAC PT "6 Clicks" Mobility   Outcome Measure  Help needed turning from your back to your side while in a flat bed without using bedrails?: A Little Help needed moving from lying on your back to sitting on the side of a flat bed without using bedrails?: A  Little Help needed moving to and from a bed to a chair (including a wheelchair)?: A Little Help needed standing up from a chair using your arms (e.g., wheelchair or bedside chair)?: A Little Help needed to walk in hospital room?: A Little Help needed climbing 3-5 steps with a railing? : Total 6 Click Score: 16    End of Session   Activity Tolerance: Patient tolerated treatment well Patient left: in chair;with call bell/phone within reach;with chair alarm set Nurse Communication: Mobility status PT Visit Diagnosis: Unsteadiness on feet (R26.81);Other abnormalities of gait and mobility (R26.89);Muscle weakness (generalized) (M62.81);History of falling (Z91.81)     Time:  7262-0355 PT Time Calculation (min) (ACUTE ONLY): 18 min  Charges:  $Gait Training: 8-22 mins                     [place] Pager [phone] Office [place] 10/10/2019, 12:25 PM

## 2019-10-10 NOTE — Care Management Important Message (Signed)
Important Message  Patient Details  Name: [name] [name] MRN: [id number] Date of Birth: 14-May-1951   Medicare Important Message Given:  Yes     Shelda Altes 10/10/2019, 2:49 PM

## 2019-10-10 NOTE — Progress Notes (Signed)
PROGRESS NOTE    [name] [name]  NKN:[id number] DOB: 1951/02/08 DOA: 10/05/2019 PCP: [facility]   Brief Narrative: 68 y.o.femalewith medical history significant ofCAD status post CABG, end-stage renal disease on dialysisT/Th/Sat,mixeddiastolic/systolic congestive heart failure, CVA, history ofhemoccult stool and anemia, type 2 diabetes who presenteds/p dizziness leading to a fall.Regardingfalls, she states that she has been feeling dizzy and feeling unsteady even with her cane when ambulating, and had fallen three times in past three days. PerED documentation, patient was seen at [facility] yesterday(10/6)for fall without any acute findings. Orthostatic vital signswerenormal at that time.Hemoglobin was 7.8, but on presentation (10/7) Hbg is down to 7 (was 9.3 one month ago). Fecal occult blood positive and black tarry stool noted by EDP. Regardingher anemia, she states she has had 3 days of dark stools with no bright red blood per rectum. She also reports that she has missed dialysis for this entire week. She denies any chest pain, palpitations shortness of breath. Denies any nausea, vomiting, abdominal pain.Denies use of NSAID's. In the ED, pt noted to desaturate to low 80's with ambulation, required 3 L/min O2. EDP discussed case with gastroenterologist who recommended IV PPI and NPOpast midnight. Nephrology also consulted, and agreed reasonable to give her a unit of blood although cautioned increase potassium.Patient was transferred to [facility] and underwent dialysis on 10/8. GI is following, indicate similar issues in August 2020. Had EGD at that time that did not identify any evidence of active bleeding. Also has history of nonbleeding AVM's noted on prior colonoscopy in 2017.  Patient underwent EGD 10/9 which showed a moderate hiatal hernia with a small non-bleeding Cameron erosion. No active bleeding was seen. Patient likely to have  chronic/intermittent blood loss, and possibly become transfusion dependent, per GI. Patient did not tolerate bowel prep, therefore colonoscopy was not performed.  Patient evaluated by physical therapy and deemed in need of inpatient rehab upon discharge.  Patient agreeable.  She lives in a second floor apartment and has 13 stairs to enter the home, which she is currently unable to do.    Assessment & Plan:   Principal Problem:   Acute GI bleeding Active Problems:   Unspecified atrial fibrillation (HCC)   Type 2 diabetes mellitus with chronic kidney disease on chronic dialysis, with long-term current use of insulin (HCC)   Chronic combined systolic and diastolic CHF (congestive heart failure) (HCC)   ESRD (end stage renal disease) (HCC)   Iron deficiency anemia   Recurrent falls   Generalized weakness   Hyperlipidemia   History of CVA (cerebrovascular accident)   Essential hypertension   Coronary artery disease   S/P CABG (coronary artery bypass graft)   Depression   New Onset A-fib/Flutter: seen on tele today, rate controlled. No current/previous diagnosis known.  Given anemia and recent GI bleeding requiring transfusion and frequent falls, not good candidate for anticoagulation at this time.  Echo in April 2020 showed mildly reduced EF of 45 to 50%.  Case discussed with on-call cardiologist.  Agred anticoagulation contraindicated at this time, and starting low-dose beta-blocker.  Patient already followed outpatient by cardiology, -continue Lopressor 12.5 mg PO BID - Cardiology follow-up Dr. Martinique in 3-4 weeks  Acute GI Bleeding:likely has chronic blood loss as well. Source likely [name] erosion noted on EGD 10/9 vs previously noted colon AVM (seen in 2017) vs GI malignancy.Colonoscopy unable to be performed as patient did not tolerate bowel prep. Pt remains hemodynamically stable. Heme positive stool and dark  tarry stool noted by EDP. Patient is followed by GI, has  history of iron deficiency anemia and heme positive stool. Per GI outpatient documentation, patient had plans for repair of her AVM but unsure whether patient actually followed up. Status post 2 units packed red cells.  H/H currently stable. -Continue to hold aspirin - GIsigned off -resume home oral PPI  Acute on Chronic Iron Deficiency Anemia: secondary toGI blood loss, asabove. Hbg 7.0on admission,from 9.3 one month ago, in setting of above. S/p 2 unit pRBC's. - per GI, transfuse as needed, TARGET Hbg >= 8 - monitorHbg &transfuse as needed  - iron repletion per nephro  Recurrent falls Generalized Weakness: suspect secondary to acute anemiaand possibly metabolic derangements frommissed dialysis on top of chronic deconditioning. Pt uses cane for ambulation at baseline.  Orthostatics negative x2. hostatic vitals - PT/OT following - planning for dc to rehab   Hyperkalemia - resolved:in setting of end-stage renal disease: recived Lokelma x2 and 1 amp bicarb in ED. No ECK changes noted. Patient had missed a week of dialysis prior to presentation. Underwent dialysis10/8, 10/0.  - monitor BMP daily  End Stage Renal Disease: on hemodialysis (T/Th/Sat): - Dialysisper nephro  CAD status post CABG: chronic, stable.  -aspirin on hold due to GI bleed - statin on hold while NPO  History of diastolic and systolic congestive heart failure: chronic, stable  Hypertension: chronic -Amlodipineheldforsoftblood pressures  hX CVA: -Hold aspirin secondary to GI bleed  Type 2 diabeteswith peripheral neuropathy -Normally takes 2 units 3 times daily with meals -Low-dose sliding scaleinsulin -Continue gabapentin when able to take p.o.  Depression -Continue Wellbutrin  COPD: chronic, not acutely exacerbated - continue Dulera, Atrovent   DVT prophylaxis:SCD's Code Status:Full code Family Communication:none at bedside Disposition Plan:To rehab  tomorrow, if [place] test back   Consultants:  Gastroenterology  Procedures:  EGD 10/07/19, findings as above  Antimicrobials: none   Subjective: Patient seen and examined laying in bed awake.  States she feels great today, and has no complaints.  Denies blood in stool.  States she does not feel ready for discharge because she needs to get stronger.  Advised patient she will make faster progress in rehab than in the hospital.   Objective: Vitals:   10/09/19 2133 10/10/19 0439 10/10/19 0456 10/10/19 1341  BP: 123/62 125/65  121/63  Pulse: (!) 58 65  (!) 58  Resp:  16    Temp: 98.8 F (37.1 C) 99 F (37.2 C)  99.3 F (37.4 C)  TempSrc: Oral Oral  Oral  SpO2: 98% 97%  100%  Weight:   124.6 kg     Intake/Output Summary (Last 24 hours) at 10/10/2019 1513 Last data filed at 10/09/2019 2157 Gross per 24 hour  Intake 360 ml  Output -  Net 360 ml   Filed Weights   10/08/19 1145 10/09/19 0606 10/10/19 0456  Weight: 124.5 kg 123.9 kg 124.6 kg    Examination:  General exam: Appears calm and comfortable  Respiratory system: Decreased breath sounds. Respiratory effort normal. Cardiovascular system: S1 & S2 heard, RRR.  Gastrointestinal system: Abdomen is nondistended, soft and nontender. Central nervous system: Alert and oriented. No focal neurological deficits. Extremities: moves all, bilateral LE edema Psychiatry:  normal mood, congruent affect     Data Reviewed: I have personally reviewed following labs and imaging studies  CBC: Recent Labs  Lab 10/05/19 1855  10/06/19 1850 10/08/19 0434 10/08/19 2031 10/09/19 0456 10/10/19 0544  WBC 8.2  --  8.1 5.8  --  6.0 6.9  NEUTROABS 5.5  --   --  3.9  --   --   --   HGB 7.0*  --  8.4* 7.0* 8.5* 8.5* 8.6*  HCT 23.9*   < > 26.0* 21.8* 26.5* 26.9* 27.2*  MCV 94.8  --  89.0 89.7  --  91.5 92.2  PLT 340  --  266 255  --  248 271   < > = values in this interval not displayed.   Basic Metabolic Panel:  Recent Labs  Lab 10/05/19 1855 10/06/19 0021 10/07/19 1327 10/07/19 1827 10/08/19 1714 10/09/19 0456 10/10/19 0544 10/10/19 0936  NA 140 139  --  136  --  139 139  --   K 6.2* 5.3*  --  4.1 3.8 4.0 3.8  --   CL 102 101  --  96*  --  100 99  --   CO2 23 23  --  25  --  25 23  --   GLUCOSE 116* 194*  --  142*  --  130* 157*  --   BUN 89* 93*  --  46*  --  31* 41*  --   CREATININE 10.78* 11.41*  --  6.78*  --  5.11* 7.06*  --   CALCIUM 8.0* 7.7*  --  8.2*  --  8.2* 8.6*  --   MG  --   --   --   --  1.7 1.8 2.2  --   PHOS  --   --  5.8*  --   --  4.1  --  5.2*   GFR: Estimated Creatinine Clearance: 10.6 mL/min (A) (by C-G formula based on SCr of 7.06 mg/dL (H)). Liver Function Tests: Recent Labs  Lab 10/05/19 1855 10/10/19 0936  AST 15  --   ALT 14  --   ALKPHOS 83  --   BILITOT 0.6  --   PROT 6.7  --   ALBUMIN 3.2* 2.7*   Recent Labs  Lab 10/05/19 1855  LIPASE 35   No results for input(s): AMMONIA in the last 168 hours. Coagulation Profile: No results for input(s): INR, PROTIME in the last 168 hours. Cardiac Enzymes: No results for input(s): CKTOTAL, CKMB, CKMBINDEX, TROPONINI in the last 168 hours. BNP (last 3 results) No results for input(s): PROBNP in the last 8760 hours. HbA1C: No results for input(s): HGBA1C in the last 72 hours. CBG: Recent Labs  Lab 10/09/19 1111 10/09/19 1652 10/09/19 2134 10/10/19 0719 10/10/19 1119  GLUCAP 207* 116* 155* 138* 192*   Lipid Profile: No results for input(s): CHOL, HDL, LDLCALC, TRIG, CHOLHDL, LDLDIRECT in the last 72 hours. Thyroid Function Tests: No results for input(s): TSH, T4TOTAL, FREET4, T3FREE, THYROIDAB in the last 72 hours. Anemia Panel: Recent Labs    10/08/19 1010  FERRITIN 222  TIBC 216*  IRON 33   Sepsis Labs: No results for input(s): PROCALCITON, LATICACIDVEN in the last 168 hours.  Recent Results (from the past 240 hour(s))  SARS Coronavirus 2 [facility] order, Performed in [facility]  hospital lab) Nasopharyngeal Nasopharyngeal Swab     Status: None   Collection Time: 10/05/19  8:46 PM   Specimen: Nasopharyngeal Swab  Result Value Ref Range Status   SARS Coronavirus 2 NEGATIVE NEGATIVE Final    Comment: (NOTE) If result is NEGATIVE SARS-CoV-2 target nucleic acids are NOT DETECTED. The SARS-CoV-2 RNA is generally detectable in upper and lower  respiratory specimens during the acute phase of infection. The lowest  concentration  of SARS-CoV-2 viral copies this assay can detect is 250  copies / mL. A negative result does not preclude SARS-CoV-2 infection  and should not be used as the sole basis for treatment or other  patient management decisions.  A negative result may occur with  improper specimen collection / handling, submission of specimen other  than nasopharyngeal swab, presence of viral mutation(s) within the  areas targeted by this assay, and inadequate number of viral copies  (<250 copies / mL). A negative result must be combined with clinical  observations, patient history, and epidemiological information. If result is POSITIVE SARS-CoV-2 target nucleic acids are DETECTED. The SARS-CoV-2 RNA is generally detectable in upper and lower  respiratory specimens dur ing the acute phase of infection.  Positive  results are indicative of active infection with SARS-CoV-2.  Clinical  correlation with patient history and other diagnostic information is  necessary to determine patient infection status.  Positive results do  not rule out bacterial infection or co-infection with other viruses. If result is PRESUMPTIVE POSTIVE SARS-CoV-2 nucleic acids MAY BE PRESENT.   A presumptive positive result was obtained on the submitted specimen  and confirmed on repeat testing.  While 2019 novel coronavirus  (SARS-CoV-2) nucleic acids may be present in the submitted sample  additional confirmatory testing may be necessary for epidemiological  and / or clinical management  purposes  to differentiate between  SARS-CoV-2 and other Sarbecovirus currently known to infect humans.  If clinically indicated additional testing with an alternate test  methodology [phone]) is advised. The SARS-CoV-2 RNA is generally  detectable in upper and lower respiratory sp ecimens during the acute  phase of infection. The expected result is Negative. Fact Sheet for Patients:  StrictlyIdeas.no Fact Sheet for Healthcare Providers: BankingDealers.co.za This test is not yet approved or cleared by the [place] FDA and has been authorized for detection and/or diagnosis of SARS-CoV-2 by FDA under an Emergency Use Authorization (EUA).  This EUA will remain in effect (meaning this test can be used) for the duration of the COVID-19 declaration under Section 564(b)(1) of the Act, 21 U.S.C. section 360bbb-3(b)(1), unless the authorization is terminated or revoked sooner. Performed at [facility], [place] [address], [place], [place] [address]   MRSA PCR Screening     Status: None   Collection Time: 10/06/19 10:40 PM   Specimen: Nasopharyngeal  Result Value Ref Range Status   MRSA by PCR NEGATIVE NEGATIVE Final    Comment:        The GeneXpert MRSA Assay (FDA approved for NASAL specimens only), is one component of a comprehensive MRSA colonization surveillance program. It is not intended to diagnose MRSA infection nor to guide or monitor treatment for MRSA infections. Performed at [facility], [place] [address], [place], [place] [address]          Radiology Studies: No results found.      Scheduled Meds: . buPROPion  150 mg Oral BID  . Chlorhexidine Gluconate Cloth  6 each Topical Q0600  . darbepoetin (ARANESP) injection - DIALYSIS  100 mcg Intravenous Q Thu-HD  . gabapentin  300 mg Oral Daily  . insulin aspart  0-15 Units Subcutaneous TID WC  . metoprolol tartrate  12.5 mg Oral BID  .  pantoprazole  40 mg Oral Daily   Continuous Infusions: . [START ON 10/11/2019] ferric gluconate (FERRLECIT/NULECIT) IV       LOS: 5 days    Time spent: 40-45 min  Ezekiel Slocumb, DO [facility] Pager [phone]  If 7PM-7AM, please contact night-coverage [email or web address] Password [email or web address] 10/10/2019, 3:13 PM

## 2019-10-10 NOTE — Progress Notes (Signed)
[facility] Progress Note   Subjective: Awake, alert, says she is worried because she can't stand up or walk. She lives in apartment with stairs. No other complaints today.     Objective Vitals:   10/09/19 1740 10/09/19 2133 10/10/19 0439 10/10/19 0456  BP: (!) 95/59 123/62 125/65   Pulse: (!) 58 (!) 58 65   Resp:   16   Temp:  98.8 F (37.1 C) 99 F (37.2 C)   TempSrc:  Oral Oral   SpO2: 97% 98% 97%   Weight:    124.6 kg   Physical Exam General: Pleasant older female in NAD Heart:HS Distant, S1,S2 irregular. Afib on monitor. Rate is controlled.  Lungs: CTAB A/P Abdomen: Obese, S, NT Extremities: Very trace BLE ede,a Dialysis Access: RIJ TDC Drsg soiled. L AVF maturing, + Bruit      Additional Objective Labs: Basic Metabolic Panel: Recent Labs  Lab 10/07/19 1327 10/07/19 1827 10/08/19 1714 10/09/19 0456 10/10/19 0544  NA  --  136  --  139 139  K  --  4.1 3.8 4.0 3.8  CL  --  96*  --  100 99  CO2  --  25  --  25 23  GLUCOSE  --  142*  --  130* 157*  BUN  --  46*  --  31* 41*  CREATININE  --  6.78*  --  5.11* 7.06*  CALCIUM  --  8.2*  --  8.2* 8.6*  PHOS 5.8*  --   --  4.1  --    Liver Function Tests: Recent Labs  Lab 10/05/19 1855  AST 15  ALT 14  ALKPHOS 83  BILITOT 0.6  PROT 6.7  ALBUMIN 3.2*   Recent Labs  Lab 10/05/19 1855  LIPASE 35   CBC: Recent Labs  Lab 10/05/19 1855  10/06/19 1850 10/08/19 0434 10/08/19 2031 10/09/19 0456 10/10/19 0544  WBC 8.2  --  8.1 5.8  --  6.0 6.9  NEUTROABS 5.5  --   --  3.9  --   --   --   HGB 7.0*  --  8.4* 7.0* 8.5* 8.5* 8.6*  HCT 23.9*   < > 26.0* 21.8* 26.5* 26.9* 27.2*  MCV 94.8  --  89.0 89.7  --  91.5 92.2  PLT 340  --  266 255  --  248 271   < > = values in this interval not displayed.   Blood Culture    Component Value Date/Time   SDES BLOOD LEFT HAND 08/19/2019 0714   SDES BLOOD LEFT HAND 08/19/2019 0714   SPECREQUEST  08/19/2019 0714    BOTTLES DRAWN AEROBIC ONLY Blood  Culture adequate volume   SPECREQUEST  08/19/2019 0714    BOTTLES DRAWN AEROBIC ONLY Blood Culture adequate volume   CULT  08/19/2019 0714    NO GROWTH 5 DAYS Performed at [facility], [place] [address], [place], [place] [address]    CULT  08/19/2019 [phone]    NO GROWTH 5 DAYS Performed at [facility], [place] [address], [place], [place] [address]    REPTSTATUS 08/24/2019 FINAL 08/19/2019 0714   REPTSTATUS 08/24/2019 FINAL 08/19/2019 0714    Cardiac Enzymes: No results for input(s): CKTOTAL, CKMB, CKMBINDEX, TROPONINI in the last 168 hours. CBG: Recent Labs  Lab 10/09/19 0808 10/09/19 1111 10/09/19 1652 10/09/19 2134 10/10/19 0719  GLUCAP 118* 207* 116* 155* 138*   Iron Studies:  Recent Labs    10/08/19 1010  IRON 33  TIBC 216*  FERRITIN 222   @lablastinr3 @ Studies/Results: No results found. Medications: . [START ON 10/11/2019] ferric gluconate (FERRLECIT/NULECIT) IV     . buPROPion  150 mg Oral BID  . Chlorhexidine Gluconate Cloth  6 each Topical Q0600  . darbepoetin (ARANESP) injection - DIALYSIS  100 mcg Intravenous Q Thu-HD  . gabapentin  300 mg Oral Daily  . insulin aspart  0-15 Units Subcutaneous TID WC  . metoprolol tartrate  12.5 mg Oral BID  . pantoprazole  40 mg Oral Daily     Dialysis Orders: [facility] TTS  4 hr 15 min EDW 121 kg F 180 dialyzer 2K/ 2 Ca bath 400/800  Mircera 100 mcg IV q 2 weeks, to start 10/14, micera 30 mcg IV given 10/1 -No heparin -Calcitriol 0.25 mcg PO TIW  Assessment/Plan: 1.Acute GIB - heme +stool, enteroscopy w/no source of bleeding, colonoscopy not completed b/c pt did not tolerated prep. S/p 2 units pRBC this admission. 2.  New onset A fib/flutter - noted on tele yesterday, rate controlled. No anticoagulation for now 2/2 GIB. Starting low dose BB.  Cardiology consult.  3. Weakness/falls - likely 2/2 ABLA, orthostatics VS ok. Needs PT consult.  4. Hyperkalemia - resolved.  5. ESRD - on HD TTS. Next HD 10/13. 6.  Anemia of CKD- Hgb 8.6 s/p 2units pRBC this admission. aranesp ^129mcg qwk. Tsat 15%, start Fe load.  7. Secondary hyperparathyroidism - Ca at goal, phos elevated. Will recheck today, if remains elevated need to start binder. Continue VDRA. 8. HTN/volume - BP soft, antihypertensives on hold. No evidence of volume overload on exam. Wt 124.6 kg today. 2-2.5 liters with HD tomorrow.  9. Nutrition - Renal diet w/fluid restrictions.  10. CAD s/p CABG 11. Hx diastolic/systolic HF 12. Hx CVA 13. DMT2       H.  NP-C 10/10/2019, 8:47 AM  [employer] [phone]

## 2019-10-10 NOTE — TOC Progression Note (Signed)
Transition of Care [facility]) - Progression Note    Patient Details  Name: [name] [name] MRN: [id number] Date of Birth: 05-Feb-1951  Transition of Care [facility]) CM/SW [place], [place] Phone Number: 10/10/2019, 6:13 PM  Clinical Narrative:     CSW received negative COVID result at 5:30. CSW called Freda Munro, admissions director at [facility] and left a message asking if she could discharge.   CSW is awaiting a return phone call.   Expected Discharge Plan: Skilled Nursing Facility Barriers to Discharge: Continued Medical Work up, SNF Pending bed offer  Expected Discharge Plan and Services Expected Discharge Plan: [place] arrangements for the past 2 months: Apartment                                       Social Determinants of Health (SDOH) Interventions    Readmission Risk Interventions Readmission Risk Prevention Plan 10/08/2019 08/26/2019 06/03/2019  Transportation Screening - Complete -  Medication Review [employer] photographer) - Complete -  PCP or Specialist appointment within 3-5 days of discharge - Complete Complete  HRI or [place] - Complete -  SW Recovery Care/Counseling Consult - Complete -  Palliative Care Screening Not Applicable Not Applicable -  [place] - Not Applicable -  Some recent data might be hidden

## 2019-10-10 NOTE — TOC Progression Note (Addendum)
Transition of Care [facility]) - Progression Note    Patient Details  Name: [name] [name] MRN: [id number] Date of Birth: 07/23/1951  Transition of Care [facility]) CM/SW [place], LCSW Phone Number: 10/10/2019, 12:32 PM  Clinical Narrative:   Aida Puffer for pt to admit to [facility] today. MD notified. Pt needs updated covid prior to d/c.    Expected Discharge Plan: Skilled Nursing Facility Barriers to Discharge: Continued Medical Work up, SNF Pending bed offer  Expected Discharge Plan and Services Expected Discharge Plan: [place] arrangements for the past 2 months: Apartment                                       Social Determinants of Health (SDOH) Interventions    Readmission Risk Interventions Readmission Risk Prevention Plan 10/08/2019 08/26/2019 06/03/2019  Transportation Screening - Complete -  Medication Review Press photographer) - Complete -  PCP or Specialist appointment within 3-5 days of discharge - Complete Complete  HRI or [place] - Complete -  SW Recovery Care/Counseling Consult - Complete -  Palliative Care Screening Not Applicable Not Applicable -  [place] - Not Applicable -  Some recent data might be hidden

## 2019-10-11 DIAGNOSIS — I5042 Chronic combined systolic (congestive) and diastolic (congestive) heart failure: Secondary | ICD-10-CM

## 2019-10-11 LAB — RENAL FUNCTION PANEL
Albumin: 2.5 g/dL — ABNORMAL LOW (ref 3.5–5.0)
Anion gap: 15 (ref 5–15)
BUN: 63 mg/dL — ABNORMAL HIGH (ref 8–23)
CO2: 23 mmol/L (ref 22–32)
Calcium: 8.5 mg/dL — ABNORMAL LOW (ref 8.9–10.3)
Chloride: 101 mmol/L (ref 98–111)
Creatinine, Ser: 8.66 mg/dL — ABNORMAL HIGH (ref 0.44–1.00)
GFR calc Af Amer: 5 mL/min — ABNORMAL LOW (ref >60)
GFR calc non Af Amer: 4 mL/min — ABNORMAL LOW (ref >60)
Glucose, Bld: 180 mg/dL — ABNORMAL HIGH (ref 70–99)
Phosphorus: 5.7 mg/dL — ABNORMAL HIGH (ref 2.5–4.6)
Potassium: 4.5 mmol/L (ref 3.5–5.1)
Sodium: 139 mmol/L (ref 135–145)

## 2019-10-11 LAB — CBC
HCT: 25.3 % — ABNORMAL LOW (ref 36.0–46.0)
Hemoglobin: 7.9 g/dL — ABNORMAL LOW (ref 12.0–15.0)
MCH: 29.2 pg (ref 26.0–34.0)
MCHC: 31.2 g/dL (ref 30.0–36.0)
MCV: 93.4 fL (ref 80.0–100.0)
Platelets: 263 10*3/uL (ref 150–400)
RBC: 2.71 MIL/uL — ABNORMAL LOW (ref 3.87–5.11)
RDW: 19 % — ABNORMAL HIGH (ref 11.5–15.5)
WBC: 6 10*3/uL (ref 4.0–10.5)
nRBC: 0 % (ref 0.0–0.2)

## 2019-10-11 LAB — GLUCOSE, CAPILLARY: Glucose-Capillary: 166 mg/dL — ABNORMAL HIGH (ref 70–99)

## 2019-10-11 MED ORDER — METOPROLOL TARTRATE 25 MG PO TABS: 12.5000 mg | ORAL_TABLET | Freq: Two times a day (BID) | ORAL | 1 refills | Status: DC

## 2019-10-11 MED ORDER — HEPARIN SODIUM (PORCINE) 1000 UNIT/ML IJ SOLN
INTRAMUSCULAR | Status: AC
  Filled 2019-10-11: qty 3

## 2019-10-11 MED ORDER — PENTAFLUOROPROP-TETRAFLUOROETH EX AERO: 1.0000 "application " | INHALATION_SPRAY | CUTANEOUS | Status: DC | PRN

## 2019-10-11 MED ORDER — LIDOCAINE-PRILOCAINE 2.5-2.5 % EX CREA: 1.0000 "application " | TOPICAL_CREAM | CUTANEOUS | Status: DC | PRN

## 2019-10-11 MED ORDER — ALTEPLASE 2 MG IJ SOLR: 2.0000 mg | Freq: Once | INTRAMUSCULAR | Status: DC | PRN

## 2019-10-11 MED ORDER — LIDOCAINE HCL (PF) 1 % IJ SOLN: 5.0000 mL | INTRAMUSCULAR | Status: DC | PRN

## 2019-10-11 MED ORDER — SODIUM CHLORIDE 0.9 % IV SOLN: 100.0000 mL | INTRAVENOUS | Status: DC | PRN

## 2019-10-11 MED ORDER — HEPARIN SODIUM (PORCINE) 1000 UNIT/ML DIALYSIS
1000.0000 [IU] | INTRAMUSCULAR | Status: DC | PRN
  Filled 2019-10-11: qty 1

## 2019-10-11 MED ORDER — INSULIN ASPART 100 UNIT/ML ~~LOC~~ SOLN: 0.0000 [IU] | Freq: Three times a day (TID) | SUBCUTANEOUS | 11 refills | Status: DC

## 2019-10-11 NOTE — Discharge Summary (Signed)
Physician Discharge Summary  [name] [name] ZYS:[id number] DOB: 1951/01/21 DOA: 10/05/2019  PCP: [place] date: 10/05/2019 Discharge date: 10/11/2019  Admitted From: Home  Disposition: [facility]   Recommendations for Outpatient Follow-up:  1. Follow up with PCP in 1-2 weeks 2. Please obtain BMP/CBC in one week 3. Please follow up on the following pending results:  Home Health: No Equipment/Devices: None  Discharge Condition: Stable CODE STATUS: DNR Diet recommendation: Renal diet, fluid restriction 1200 mL  Brief/Interim Summary: 68 y.o.femalewith medical history significant ofCAD status post CABG, end-stage renal disease on dialysisT/Th/Sat,mixeddiastolic/systolic congestive heart failure, CVA, history ofhemoccult stool and anemia, type 2 diabetes who presenteds/p dizziness leading to a fall.States that she has been feeling dizzy and feeling unsteady even with her cane when ambulating, and had fallen three times in past three days. She had been seen in the ED the day prior to admission after a fall as well.  Orthostatic vital signswerenormal at that time, hemoglobin was 7.8.  On presentation today (10/7) Hbg is down to 7 (was 9.3 one month ago). Fecal occult blood positive and black tarry stool noted by EDP. She reported 3 days of dark stools with no bright red blood per rectum. She also reports that she has missed dialysis for this entire week. EDP discussed case with gastroenterologist who recommended IV PPI and NPOpast midnight. Nephrology also consulted, and agreed reasonable to give her a unit of blood although cautioned increase potassium.Patient was transferred to [facility] and underwent dialysis on 10/8. Patient had similar bleeding issues in August 2020, underwent EGD at that time that did not identify any evidence of active bleeding. Also has history of nonbleeding AVM's noted on prior colonoscopy in 2017.  Patient  underwent EGD 10/9 which showed a moderate hiatal hernia with a small non-bleeding Cameron erosion. No active bleeding was seen. Patient likely to have chronic/intermittent blood loss, and possibly become transfusion dependent, per GI. Patient did not tolerate bowel prep, therefore colonoscopy was not able to be performed.  Patient's hemoglobin stabilized after total 2 units red cell transfused.  She remained hemodynamically stable throughout admission.    New Onset A-fib/Flutter: rate controlled. Given anemia andrecentGI bleedingrequiringtransfusion and frequent falls, not good candidate for anticoagulation at this time.  Echo in April 2020 showed mildly reduced EF of 45 to 50%.  Case discussed with on-call cardiologist.  Agred anticoagulation contraindicated at this time, and starting low-dose beta-blocker.  Patient already followed outpatient by cardiology, -continue Lopressor 12.5 mg PO BID - Cardiology follow-up Dr. Martinique in 3-4 weeks  Patient evaluated by physical therapy and deemed in need of inpatient rehab upon discharge.  Patient agreeable.  She lives in a second floor apartment and has 13 stairs to enter the home, which she is currently unable to do.  Orthostatic vitals checked and were negative x 2.  Discharge Diagnoses:  Principal Problem:   Acute GI bleeding Active Problems:   Unspecified atrial fibrillation (HCC)   Type 2 diabetes mellitus with chronic kidney disease on chronic dialysis, with long-term current use of insulin (HCC)   Chronic combined systolic and diastolic CHF (congestive heart failure) (HCC)   ESRD (end stage renal disease) (HCC)   Iron deficiency anemia   Recurrent falls   Generalized weakness   Hyperlipidemia   History of CVA (cerebrovascular accident)   Essential hypertension   Coronary artery disease   S/P CABG (coronary artery bypass graft)   Depression     Discharge  Instructions  Discharge Instructions    Call MD for:   Complete by: As  directed    Heart palpitations or heart racing   Call MD for:  extreme fatigue   Complete by: As directed    Call MD for:  persistant dizziness or light-headedness   Complete by: As directed    Diet - low sodium heart healthy   Complete by: As directed    Discharge instructions   Complete by: As directed    Because you had GI bleeding: - Do take any non-steroidal anti-inflammatory medication (ibuprofen, naproxen), or aspirin. - This is to protect your stomach and reduce risk of further bleeding. - Continue your Protonix once daily.  You have an abnormal heart rhythm, called Atrial Fibrillation - start taking Lopressor (metoprolol tartrate) twice daily, as prescribed - follow up with Cardiologist, Dr. Martinique in 3-4 weeks   Increase activity slowly   Complete by: As directed      Allergies as of 10/11/2019      Reactions   Naproxen Rash      Medication List    STOP taking these medications   amLODipine 5 MG tablet Commonly known as: NORVASC   aspirin EC 325 MG tablet   atorvastatin 40 MG tablet Commonly known as: LIPITOR   insulin lispro 100 UNIT/ML KwikPen Commonly known as: HumaLOG KwikPen     TAKE these medications   allopurinol 100 MG tablet Commonly known as: ZYLOPRIM Take 100 mg by mouth 2 (two) times daily.   brinzolamide 1 % ophthalmic suspension Commonly known as: AZOPT Place 1 drop into both eyes 2 (two) times a day.   buPROPion 150 MG 12 hr tablet Commonly known as: WELLBUTRIN SR Take 1 tablet (150 mg total) by mouth 2 (two) times daily.   cetirizine 10 MG tablet Commonly known as: ZYRTEC Take 10 mg by mouth daily.   ferrous sulfate 325 (65 FE) MG tablet Take 325 mg by mouth 2 (two) times daily with a meal.   gabapentin 300 MG capsule Commonly known as: NEURONTIN Take 1 capsule (300 mg total) by mouth daily.   insulin aspart 100 UNIT/ML injection Commonly known as: novoLOG Inject 0-15 Units into the skin 3 (three) times daily with  meals. What changed:   how much to take  when to take this   ipratropium 17 MCG/ACT inhaler Commonly known as: ATROVENT HFA Inhale 2 puffs into the lungs every 6 (six) hours as needed for wheezing.   latanoprost 0.005 % ophthalmic solution Commonly known as: XALATAN Place 1 drop into both eyes at bedtime.   Linzess 72 MCG capsule Generic drug: linaclotide Take 72 mcg by mouth daily before breakfast.   metoprolol tartrate 25 MG tablet Commonly known as: LOPRESSOR Take 0.5 tablets (12.5 mg total) by mouth 2 (two) times daily.   mometasone-formoterol 100-5 MCG/ACT Aero Commonly known as: DULERA Inhale 2 puffs into the lungs every 4 (four) hours as needed for wheezing.   nitroGLYCERIN 0.4 MG SL tablet Commonly known as: NITROSTAT Place 1 tablet (0.4 mg total) under the tongue every 5 (five) minutes as needed for chest pain.   pantoprazole 40 MG tablet Commonly known as: PROTONIX Take 1 tablet (40 mg total) by mouth daily.   polyethylene glycol 17 g packet Commonly known as: MIRALAX / GLYCOLAX Take 17 g by mouth daily as needed for mild constipation.   ProAir HFA 108 (90 Base) MCG/ACT inhaler Generic drug: albuterol Inhale 2 puffs into the lungs every 6 (six)  hours as needed for wheezing.   Wixela Inhub 250-50 MCG/DOSE Aepb Generic drug: Fluticasone-Salmeterol Inhale 2 puffs into the lungs daily.       Allergies  Allergen Reactions  . Naproxen Rash    Consultations:  Nephrology, Dr. Hollie Salk  Gastroenterology, Dr. Lyndel Safe   Procedures/Studies: Dg Lumbar Spine Complete  Result Date: 10/04/2019 CLINICAL DATA:  Low back pain after a fall this morning. EXAM: LUMBAR SPINE - COMPLETE 4+ VIEW COMPARISON:  None. FINDINGS: Vertebral body height and alignment are maintained. There is loss of disc space height and facet degenerative disease at L4-5 and L5-S1. Paraspinous structures demonstrate extensive atherosclerotic vascular disease. The descending abdominal aorta  measures 3.7 cm. IMPRESSION: No acute abnormality. Lower lumbar degenerative disease. Atherosclerosis with a 3.7 cm abdominal aortic aneurysm. Recommend followup by ultrasound in 2 years. This recommendation follows ACR consensus guidelines: White Paper of the ACR Incidental Findings Committee II on Vascular Findings. J Am Coll Radiol 2013; 10:789-794. Electronically Signed   By: Inge Rise M.D.   On: 10/04/2019 09:52   Dg Chest Portable 1 View  Result Date: 10/04/2019 CLINICAL DATA:  Shortness of breath today with fall and low back pain. EXAM: PORTABLE CHEST 1 VIEW COMPARISON:  08/25/2019 FINDINGS: Right IJ dialysis catheter unchanged. Lungs are adequately inflated without lobar consolidation or effusion. Stable cardiomegaly. Remainder of the exam is unchanged. IMPRESSION: No acute cardiopulmonary disease. Stable cardiomegaly. Electronically Signed   By: Marin Olp M.D.   On: 10/04/2019 10:08      Subjective:  Patient seen and examined, sleep comfortably, awoken easily.  Denies complaints today, other than residual posterior right shoulder pain from her fall at home.  No chest pain, SOB, and denies further bleeding.   Discharge Exam: Vitals:   10/11/19 0439 10/11/19 0820  BP: 130/65   Pulse: 72   Resp: 18 18  Temp: 98.5 F (36.9 C)   SpO2: 99% 99%   Vitals:   10/10/19 2019 10/10/19 2243 10/11/19 0439 10/11/19 0820  BP: 132/74 116/66 130/65   Pulse: (!) 56 61 72   Resp: 18  18 18   Temp: 97.6 F (36.4 C)  98.5 F (36.9 C)   TempSrc: Oral  Axillary   SpO2: 99%  99% 99%  Weight:   120.4 kg     General: Pt is alert, awake, not in acute distress Cardiovascular: RRR, S1/S2 +, no rubs, no gallops Respiratory: CTA bilaterally, no wheezing, no rhonchi Abdominal: Soft, NT, ND, bowel sounds + Extremities: no edema, no cyanosis    The results of significant diagnostics from this hospitalization (including imaging, microbiology, ancillary and laboratory) are listed below for  reference.     Microbiology: Recent Results (from the past 240 hour(s))  SARS Coronavirus 2 [facility] order, Performed in [facility] hospital lab) Nasopharyngeal Nasopharyngeal Swab     Status: None   Collection Time: 10/05/19  8:46 PM   Specimen: Nasopharyngeal Swab  Result Value Ref Range Status   SARS Coronavirus 2 NEGATIVE NEGATIVE Final    Comment: (NOTE) If result is NEGATIVE SARS-CoV-2 target nucleic acids are NOT DETECTED. The SARS-CoV-2 RNA is generally detectable in upper and lower  respiratory specimens during the acute phase of infection. The lowest  concentration of SARS-CoV-2 viral copies this assay can detect is 250  copies / mL. A negative result does not preclude SARS-CoV-2 infection  and should not be used as the sole basis for treatment or other  patient management decisions.  A negative result may occur with  improper specimen collection / handling, submission of specimen other  than nasopharyngeal swab, presence of viral mutation(s) within the  areas targeted by this assay, and inadequate number of viral copies  (<250 copies / mL). A negative result must be combined with clinical  observations, patient history, and epidemiological information. If result is POSITIVE SARS-CoV-2 target nucleic acids are DETECTED. The SARS-CoV-2 RNA is generally detectable in upper and lower  respiratory specimens dur ing the acute phase of infection.  Positive  results are indicative of active infection with SARS-CoV-2.  Clinical  correlation with patient history and other diagnostic information is  necessary to determine patient infection status.  Positive results do  not rule out bacterial infection or co-infection with other viruses. If result is PRESUMPTIVE POSTIVE SARS-CoV-2 nucleic acids MAY BE PRESENT.   A presumptive positive result was obtained on the submitted specimen  and confirmed on repeat testing.  While 2019 novel coronavirus  (SARS-CoV-2) nucleic acids may be  present in the submitted sample  additional confirmatory testing may be necessary for epidemiological  and / or clinical management purposes  to differentiate between  SARS-CoV-2 and other Sarbecovirus currently known to infect humans.  If clinically indicated additional testing with an alternate test  methodology [phone]) is advised. The SARS-CoV-2 RNA is generally  detectable in upper and lower respiratory sp ecimens during the acute  phase of infection. The expected result is Negative. Fact Sheet for Patients:  StrictlyIdeas.no Fact Sheet for Healthcare Providers: BankingDealers.co.za This test is not yet approved or cleared by the [place] FDA and has been authorized for detection and/or diagnosis of SARS-CoV-2 by FDA under an Emergency Use Authorization (EUA).  This EUA will remain in effect (meaning this test can be used) for the duration of the COVID-19 declaration under Section 564(b)(1) of the Act, 21 U.S.C. section 360bbb-3(b)(1), unless the authorization is terminated or revoked sooner. Performed at [facility], [place] [address] [place], [place] [address]   MRSA PCR Screening     Status: None   Collection Time: 10/06/19 10:40 PM   Specimen: Nasopharyngeal  Result Value Ref Range Status   MRSA by PCR NEGATIVE NEGATIVE Final    Comment:        The GeneXpert MRSA Assay (FDA approved for NASAL specimens only), is one component of a comprehensive MRSA colonization surveillance program. It is not intended to diagnose MRSA infection nor to guide or monitor treatment for MRSA infections. Performed at [facility], [place] [address] [place], [place] [address]   SARS CORONAVIRUS 2 (TAT 6-24 HRS) Nasopharyngeal Nasopharyngeal Swab     Status: None   Collection Time: 10/10/19  1:26 PM   Specimen: Nasopharyngeal Swab  Result Value Ref Range Status   SARS Coronavirus 2 NEGATIVE NEGATIVE Final     Comment: (NOTE) SARS-CoV-2 target nucleic acids are NOT DETECTED. The SARS-CoV-2 RNA is generally detectable in upper and lower respiratory specimens during the acute phase of infection. Negative results do not preclude SARS-CoV-2 infection, do not rule out co-infections with other pathogens, and should not be used as the sole basis for treatment or other patient management decisions. Negative results must be combined with clinical observations, patient history, and epidemiological information. The expected result is Negative. Fact Sheet for Patients: SugarRoll.be Fact Sheet for Healthcare Providers: https://www.woods-mathews.com/ This test is not yet approved or cleared by the [place] FDA and  has been authorized for detection and/or diagnosis of SARS-CoV-2 by FDA under an Emergency Use Authorization (  EUA). This EUA will remain  in effect (meaning this test can be used) for the duration of the COVID-19 declaration under Section 56 4(b)(1) of the Act, 21 U.S.C. section 360bbb-3(b)(1), unless the authorization is terminated or revoked sooner. Performed at [facility], [place] [address], [place], [place] [address]      Labs: BNP (last 3 results) Recent Labs    04/27/19 1942 06/01/19 2322 08/16/19 0934  BNP 619.8* 331.8* 175.1*   Basic Metabolic Panel: Recent Labs  Lab 10/05/19 1855 10/06/19 0021 10/07/19 1327 10/07/19 1827 10/08/19 1714 10/09/19 0456 10/10/19 0544 10/10/19 0936  NA 140 139  --  136  --  139 139  --   K 6.2* 5.3*  --  4.1 3.8 4.0 3.8  --   CL 102 101  --  96*  --  100 99  --   CO2 23 23  --  25  --  25 23  --   GLUCOSE 116* 194*  --  142*  --  130* 157*  --   BUN 89* 93*  --  46*  --  31* 41*  --   CREATININE 10.78* 11.41*  --  6.78*  --  5.11* 7.06*  --   CALCIUM 8.0* 7.7*  --  8.2*  --  8.2* 8.6*  --   MG  --   --   --   --  1.7 1.8 2.2  --   PHOS  --   --  5.8*  --   --  4.1  --  5.2*   Liver  Function Tests: Recent Labs  Lab 10/05/19 1855 10/10/19 0936  AST 15  --   ALT 14  --   ALKPHOS 83  --   BILITOT 0.6  --   PROT 6.7  --   ALBUMIN 3.2* 2.7*   Recent Labs  Lab 10/05/19 1855  LIPASE 35   No results for input(s): AMMONIA in the last 168 hours. CBC: Recent Labs  Lab 10/05/19 1855  10/06/19 1850 10/08/19 0434 10/08/19 2031 10/09/19 0456 10/10/19 0544  WBC 8.2  --  8.1 5.8  --  6.0 6.9  NEUTROABS 5.5  --   --  3.9  --   --   --   HGB 7.0*  --  8.4* 7.0* 8.5* 8.5* 8.6*  HCT 23.9*   < > 26.0* 21.8* 26.5* 26.9* 27.2*  MCV 94.8  --  89.0 89.7  --  91.5 92.2  PLT 340  --  266 255  --  248 271   < > = values in this interval not displayed.   Cardiac Enzymes: No results for input(s): CKTOTAL, CKMB, CKMBINDEX, TROPONINI in the last 168 hours. BNP: Invalid input(s): POCBNP CBG: Recent Labs  Lab 10/10/19 0719 10/10/19 1119 10/10/19 1640 10/10/19 2137 10/11/19 0755  GLUCAP 138* 192* 173* 164* 166*   D-Dimer No results for input(s): DDIMER in the last 72 hours. Hgb A1c No results for input(s): HGBA1C in the last 72 hours. Lipid Profile No results for input(s): CHOL, HDL, LDLCALC, TRIG, CHOLHDL, LDLDIRECT in the last 72 hours. Thyroid function studies No results for input(s): TSH, T4TOTAL, T3FREE, THYROIDAB in the last 72 hours.  Invalid input(s): FREET3 Anemia work up Recent Labs    10/08/19 1010  FERRITIN 222  TIBC 216*  IRON 33   Urinalysis    Component Value Date/Time   COLORURINE YELLOW 08/19/2019 [place] (A) 08/19/2019 0712   LABSPEC  1.012 08/19/2019 0712   PHURINE 5.0 08/19/2019 0712   GLUCOSEU NEGATIVE 08/19/2019 0712   HGBUR NEGATIVE 08/19/2019 0712   BILIRUBINUR NEGATIVE 08/19/2019 0712   KETONESUR NEGATIVE 08/19/2019 0712   PROTEINUR NEGATIVE 08/19/2019 0712   UROBILINOGEN 0.2 05/12/2015 0938   NITRITE NEGATIVE 08/19/2019 0712   LEUKOCYTESUR SMALL (A) 08/19/2019 0712   Sepsis Labs Invalid input(s):  PROCALCITONIN,  WBC,  LACTICIDVEN Microbiology Recent Results (from the past 240 hour(s))  SARS Coronavirus 2 [facility] order, Performed in [facility] hospital lab) Nasopharyngeal Nasopharyngeal Swab     Status: None   Collection Time: 10/05/19  8:46 PM   Specimen: Nasopharyngeal Swab  Result Value Ref Range Status   SARS Coronavirus 2 NEGATIVE NEGATIVE Final    Comment: (NOTE) If result is NEGATIVE SARS-CoV-2 target nucleic acids are NOT DETECTED. The SARS-CoV-2 RNA is generally detectable in upper and lower  respiratory specimens during the acute phase of infection. The lowest  concentration of SARS-CoV-2 viral copies this assay can detect is 250  copies / mL. A negative result does not preclude SARS-CoV-2 infection  and should not be used as the sole basis for treatment or other  patient management decisions.  A negative result may occur with  improper specimen collection / handling, submission of specimen other  than nasopharyngeal swab, presence of viral mutation(s) within the  areas targeted by this assay, and inadequate number of viral copies  (<250 copies / mL). A negative result must be combined with clinical  observations, patient history, and epidemiological information. If result is POSITIVE SARS-CoV-2 target nucleic acids are DETECTED. The SARS-CoV-2 RNA is generally detectable in upper and lower  respiratory specimens dur ing the acute phase of infection.  Positive  results are indicative of active infection with SARS-CoV-2.  Clinical  correlation with patient history and other diagnostic information is  necessary to determine patient infection status.  Positive results do  not rule out bacterial infection or co-infection with other viruses. If result is PRESUMPTIVE POSTIVE SARS-CoV-2 nucleic acids MAY BE PRESENT.   A presumptive positive result was obtained on the submitted specimen  and confirmed on repeat testing.  While 2019 novel coronavirus  (SARS-CoV-2)  nucleic acids may be present in the submitted sample  additional confirmatory testing may be necessary for epidemiological  and / or clinical management purposes  to differentiate between  SARS-CoV-2 and other Sarbecovirus currently known to infect humans.  If clinically indicated additional testing with an alternate test  methodology [phone]) is advised. The SARS-CoV-2 RNA is generally  detectable in upper and lower respiratory sp ecimens during the acute  phase of infection. The expected result is Negative. Fact Sheet for Patients:  StrictlyIdeas.no Fact Sheet for Healthcare Providers: BankingDealers.co.za This test is not yet approved or cleared by the [place] FDA and has been authorized for detection and/or diagnosis of SARS-CoV-2 by FDA under an Emergency Use Authorization (EUA).  This EUA will remain in effect (meaning this test can be used) for the duration of the COVID-19 declaration under Section 564(b)(1) of the Act, 21 U.S.C. section 360bbb-3(b)(1), unless the authorization is terminated or revoked sooner. Performed at [facility], [place] [address], [place], [place] [address]   MRSA PCR Screening     Status: None   Collection Time: 10/06/19 10:40 PM   Specimen: Nasopharyngeal  Result Value Ref Range Status   MRSA by PCR NEGATIVE NEGATIVE Final    Comment:        The GeneXpert MRSA  Assay (FDA approved for NASAL specimens only), is one component of a comprehensive MRSA colonization surveillance program. It is not intended to diagnose MRSA infection nor to guide or monitor treatment for MRSA infections. Performed at [facility], [place] [address], [place], [place] [address]   SARS CORONAVIRUS 2 (TAT 6-24 HRS) Nasopharyngeal Nasopharyngeal Swab     Status: None   Collection Time: 10/10/19  1:26 PM   Specimen: Nasopharyngeal Swab  Result Value Ref Range Status   SARS Coronavirus 2 NEGATIVE  NEGATIVE Final    Comment: (NOTE) SARS-CoV-2 target nucleic acids are NOT DETECTED. The SARS-CoV-2 RNA is generally detectable in upper and lower respiratory specimens during the acute phase of infection. Negative results do not preclude SARS-CoV-2 infection, do not rule out co-infections with other pathogens, and should not be used as the sole basis for treatment or other patient management decisions. Negative results must be combined with clinical observations, patient history, and epidemiological information. The expected result is Negative. Fact Sheet for Patients: SugarRoll.be Fact Sheet for Healthcare Providers: https://www.woods-mathews.com/ This test is not yet approved or cleared by the [place] FDA and  has been authorized for detection and/or diagnosis of SARS-CoV-2 by FDA under an Emergency Use Authorization (EUA). This EUA will remain  in effect (meaning this test can be used) for the duration of the COVID-19 declaration under Section 56 4(b)(1) of the Act, 21 U.S.C. section 360bbb-3(b)(1), unless the authorization is terminated or revoked sooner. Performed at [facility], [place] [address], [place], [place] [address]      Time coordinating discharge: Over 30 minutes  SIGNED:   Ezekiel Slocumb, MD  [facility] 10/11/2019, 9:10 AM Pager   If 7PM-7AM, please contact night-coverage [email or web address] Password [email or web address]

## 2019-10-11 NOTE — Progress Notes (Signed)
[name] [name] Progress Note   Subjective: Being discharged today to SNF today. No C/Os. Talking on phone. For HD today.   Objective Vitals:   10/10/19 2019 10/10/19 2243 10/11/19 0439 10/11/19 0820  BP: 132/74 116/66 130/65   Pulse: (!) 56 61 72   Resp: 18  18 18   Temp: 97.6 F (36.4 C)  98.5 F (36.9 C)   TempSrc: Oral  Axillary   SpO2: 99%  99% 99%  Weight:   120.4 kg    Physical Exam General: Pleasant older female in NAD Heart:HS Distant, S1,S2 irregular. Afib on monitor. Rate is controlled.  Lungs: CTAB A/P Abdomen: Obese, S, NT Extremities: Very trace BLE ede,a Dialysis Access: RIJ TDC Drsg soiled. L AVF maturing, + Bruit   Additional Objective Labs: Basic Metabolic Panel: Recent Labs  Lab 10/07/19 1327 10/07/19 1827 10/08/19 1714 10/09/19 0456 10/10/19 0544 10/10/19 0936  NA  --  136  --  139 139  --   K  --  4.1 3.8 4.0 3.8  --   CL  --  96*  --  100 99  --   CO2  --  25  --  25 23  --   GLUCOSE  --  142*  --  130* 157*  --   BUN  --  46*  --  31* 41*  --   CREATININE  --  6.78*  --  5.11* 7.06*  --   CALCIUM  --  8.2*  --  8.2* 8.6*  --   PHOS 5.8*  --   --  4.1  --  5.2*   Liver Function Tests: Recent Labs  Lab 10/05/19 1855 10/10/19 0936  AST 15  --   ALT 14  --   ALKPHOS 83  --   BILITOT 0.6  --   PROT 6.7  --   ALBUMIN 3.2* 2.7*   Recent Labs  Lab 10/05/19 1855  LIPASE 35   CBC: Recent Labs  Lab 10/05/19 1855  10/06/19 1850 10/08/19 0434 10/08/19 2031 10/09/19 0456 10/10/19 0544  WBC 8.2  --  8.1 5.8  --  6.0 6.9  NEUTROABS 5.5  --   --  3.9  --   --   --   HGB 7.0*  --  8.4* 7.0* 8.5* 8.5* 8.6*  HCT 23.9*   < > 26.0* 21.8* 26.5* 26.9* 27.2*  MCV 94.8  --  89.0 89.7  --  91.5 92.2  PLT 340  --  266 255  --  248 271   < > = values in this interval not displayed.   Blood Culture    Component Value Date/Time   SDES BLOOD LEFT HAND 08/19/2019 0714   SDES BLOOD LEFT HAND 08/19/2019 0714   SPECREQUEST   08/19/2019 0714    BOTTLES DRAWN AEROBIC ONLY Blood Culture adequate volume   SPECREQUEST  08/19/2019 0714    BOTTLES DRAWN AEROBIC ONLY Blood Culture adequate volume   CULT  08/19/2019 0714    NO GROWTH 5 DAYS Performed at [facility], [place] [address] [place], [place] [address]    CULT  08/19/2019 [phone]    NO GROWTH 5 DAYS Performed at [facility], [place] [address] [place],  [address]    REPTSTATUS 08/24/2019 FINAL 08/19/2019 0714   REPTSTATUS 08/24/2019 FINAL 08/19/2019 0714    Cardiac Enzymes: No results for input(s): CKTOTAL, CKMB, CKMBINDEX, TROPONINI in the last 168 hours. CBG: Recent Labs  Lab  10/10/19 0719 10/10/19 1119 10/10/19 1640 10/10/19 2137 10/11/19 0755  GLUCAP 138* 192* 173* 164* 166*   Iron Studies:  Recent Labs    10/08/19 1010  IRON 33  TIBC 216*  FERRITIN 222   @[email or web address] @ Studies/Results: No results found. Medications: . ferric gluconate (FERRLECIT/NULECIT) IV     . buPROPion  150 mg Oral BID  . Chlorhexidine Gluconate Cloth  6 each Topical Q0600  . darbepoetin (ARANESP) injection - DIALYSIS  100 mcg Intravenous Q Thu-HD  . gabapentin  300 mg Oral Daily  . insulin aspart  0-15 Units Subcutaneous TID WC  . metoprolol tartrate  12.5 mg Oral BID  . pantoprazole  40 mg Oral Daily     Dialysis Orders: [facility] TTS  4 hr 15 min EDW 121 kg F 180 dialyzer 2K/ 2 Ca bath 400/800  Mircera 100 mcg IV q 2 weeks, to start 10/14, micera 30 mcg IV given 10/1 -No heparin -Calcitriol 0.25 mcg PO TIW  Assessment/Plan: 1.Acute GIB - heme +stool, enteroscopy w/no source of bleeding, colonoscopy not completed b/c pt did not tolerated prep. S/p 2 units pRBC this admission. 2. New onset A fib/flutter - noted on tele yesterday, rate controlled. No anticoagulation for now 2/2 GIB. Starting low dose BB. Cardiology consult.  3. Weakness/falls - likely 2/2 ABLA, orthostatics VS ok. Needs PT consult.  4. Hyperkalemia - resolved.  5. ESRD  -on HD TTS. Next HD today.  6. Anemia of CKD-Hgb 8.6 s/p 2units pRBC this admission. aranesp ^128mcg qwk. Tsat 15%, start Fe load. Follow HGB.  7. Secondary hyperparathyroidism -Ca at goal, phos elevated. Will recheck today, if remains elevated need to start binder. Continue VDRA. 8. HTN/volume -BP soft, antihypertensives on hold. No evidence of volume overload on exam. Wt 124.6 kg today. 2-2.5 liters with HD tomorrow.  9. Nutrition -Renal diet w/fluid restrictions.  10. CAD s/p CABG 11. Hx diastolic/systolic HF 12. Hx CVA 13. DMT2  Disposition: OK from nephrology stand point for DC.   Rita H. Brown NP-C 10/11/2019, 9:50 AM  [employer] [phone]

## 2019-10-27 ENCOUNTER — Telehealth: Payer: Self-pay | Admitting: Surgery

## 2019-10-27 ENCOUNTER — Emergency Department (HOSPITAL_COMMUNITY)
Admission: EM | Admit: 2019-10-27 | Discharge: 2019-10-27 | Disposition: A | Discharge status: Home / Self Care | Payer: Medicare Other | Attending: Emergency Medicine | Admitting: Emergency Medicine

## 2019-10-27 DIAGNOSIS — I1311 Hypertensive heart and chronic kidney disease without heart failure, with stage 5 chronic kidney disease, or end stage renal disease: Secondary | ICD-10-CM | POA: Diagnosis not present

## 2019-10-27 DIAGNOSIS — I5042 Chronic combined systolic (congestive) and diastolic (congestive) heart failure: Secondary | ICD-10-CM | POA: Diagnosis not present

## 2019-10-27 DIAGNOSIS — R531 Weakness: Secondary | ICD-10-CM | POA: Diagnosis present

## 2019-10-27 DIAGNOSIS — E875 Hyperkalemia: Secondary | ICD-10-CM | POA: Diagnosis not present

## 2019-10-27 DIAGNOSIS — F17210 Nicotine dependence, cigarettes, uncomplicated: Secondary | ICD-10-CM | POA: Diagnosis not present

## 2019-10-27 DIAGNOSIS — I259 Chronic ischemic heart disease, unspecified: Secondary | ICD-10-CM | POA: Insufficient documentation

## 2019-10-27 DIAGNOSIS — Z794 Long term (current) use of insulin: Secondary | ICD-10-CM | POA: Diagnosis not present

## 2019-10-27 DIAGNOSIS — M6281 Muscle weakness (generalized): Secondary | ICD-10-CM | POA: Diagnosis not present

## 2019-10-27 DIAGNOSIS — Z9115 Patient's noncompliance with renal dialysis: Secondary | ICD-10-CM | POA: Insufficient documentation

## 2019-10-27 DIAGNOSIS — E1122 Type 2 diabetes mellitus with diabetic chronic kidney disease: Secondary | ICD-10-CM | POA: Insufficient documentation

## 2019-10-27 DIAGNOSIS — Z79899 Other long term (current) drug therapy: Secondary | ICD-10-CM | POA: Insufficient documentation

## 2019-10-27 DIAGNOSIS — N186 End stage renal disease: Secondary | ICD-10-CM | POA: Insufficient documentation

## 2019-10-27 LAB — COMPREHENSIVE METABOLIC PANEL
ALT: 16 U/L (ref 0–44)
AST: 19 U/L (ref 15–41)
Albumin: 3.2 g/dL — ABNORMAL LOW (ref 3.5–5.0)
Alkaline Phosphatase: 147 U/L — ABNORMAL HIGH (ref 38–126)
Anion gap: 10 (ref 5–15)
BUN: 104 mg/dL — ABNORMAL HIGH (ref 8–23)
CO2: 24 mmol/L (ref 22–32)
Calcium: 8.2 mg/dL — ABNORMAL LOW (ref 8.9–10.3)
Chloride: 103 mmol/L (ref 98–111)
Creatinine, Ser: 9.6 mg/dL — ABNORMAL HIGH (ref 0.44–1.00)
GFR calc Af Amer: 4 mL/min — ABNORMAL LOW (ref >60)
GFR calc non Af Amer: 4 mL/min — ABNORMAL LOW (ref >60)
Glucose, Bld: 184 mg/dL — ABNORMAL HIGH (ref 70–99)
Potassium: 5.8 mmol/L — ABNORMAL HIGH (ref 3.5–5.1)
Sodium: 137 mmol/L (ref 135–145)
Total Bilirubin: 0.2 mg/dL — ABNORMAL LOW (ref 0.3–1.2)
Total Protein: 6.4 g/dL — ABNORMAL LOW (ref 6.5–8.1)

## 2019-10-27 LAB — CBC WITH DIFFERENTIAL/PLATELET
Abs Immature Granulocytes: 0.01 10*3/uL (ref 0.00–0.07)
Basophils Absolute: 0.1 10*3/uL (ref 0.0–0.1)
Basophils Relative: 1 %
Eosinophils Absolute: 0.2 10*3/uL (ref 0.0–0.5)
Eosinophils Relative: 3 %
HCT: 29 % — ABNORMAL LOW (ref 36.0–46.0)
Hemoglobin: 8.9 g/dL — ABNORMAL LOW (ref 12.0–15.0)
Immature Granulocytes: 0 %
Lymphocytes Relative: 28 %
Lymphs Abs: 1.3 10*3/uL (ref 0.7–4.0)
MCH: 28.7 pg (ref 26.0–34.0)
MCHC: 30.7 g/dL (ref 30.0–36.0)
MCV: 93.5 fL (ref 80.0–100.0)
Monocytes Absolute: 0.6 10*3/uL (ref 0.1–1.0)
Monocytes Relative: 13 %
Neutro Abs: 2.5 10*3/uL (ref 1.7–7.7)
Neutrophils Relative %: 55 %
Platelets: 322 10*3/uL (ref 150–400)
RBC: 3.1 MIL/uL — ABNORMAL LOW (ref 3.87–5.11)
RDW: 17.3 % — ABNORMAL HIGH (ref 11.5–15.5)
WBC: 4.6 10*3/uL (ref 4.0–10.5)
nRBC: 0 % (ref 0.0–0.2)

## 2019-10-27 MED ORDER — SODIUM ZIRCONIUM CYCLOSILICATE 10 G PO PACK
10.0000 g | PACK | Freq: Once | ORAL | Status: AC
  Administered 2019-10-27: 10 g via ORAL
  Filled 2019-10-27: qty 1

## 2019-10-27 NOTE — Telephone Encounter (Addendum)
ED CM received call from [facility] concerning patient needing DME.  CM sent DME referral to [facility] for rolling walker and 3n1, updated patient and provided address and number, patient will have someone pick up the equipment tomorrow, no further ED CM needs identified.

## 2019-10-27 NOTE — Progress Notes (Signed)
Renal Navigator has rescheduled patient's OP HD for tomorrow, Friday, 10/28/19 at 11:05am. She needs to arrive at 10:45am. Renal Navigator called [employer] and [employer] to arrange transportation for this appointment. Renal Navigator explained that this is not her new schedule, but a one time appointment due to missing her appointments this week since discharging from SNF.  Patient also states difficulty walking since discharge from SNF and that she understood that equipment (possibly a walker) would be delivered to her home this week by [place]. Renal Navigator spoke with Wanda/CM to request that she follow up on this if possible.  Alphonzo Cruise, [place] Renal Navigator [phone]

## 2019-10-27 NOTE — ED Provider Notes (Signed)
[facility] EMERGENCY DEPARTMENT Provider Note   CSN: [id number] Arrival date & time: 10/27/19  1914     History   Chief Complaint Chief Complaint  Patient presents with  . Weakness    HPI [name] [name] is a 68 y.o. female.     The history is provided by the patient and medical records. No language interpreter was used.  Weakness    68 year old female with history of end-stage renal disease currently on Tuesday/Thursday/Saturday dialysis, history of CVA, CAD status post CABG, diabetes, obesity, substance abuse brought here via EMS for evaluation of leg weakness.  Patient states she was on her way to the bus to go to dialysis today.  While walking down the steps, her legs gave out, states her left leg felt heavy followed by her right leg and subsequently she had to sit down.  She missed the bus and decided to contact EMS to bring her here.  She report pain to both legs described as a sharp throbbing sensation.  States that she has chronic pain to this is not new.  She endorsed heaviness to both legs left greater than right.  She does not complain of any fever chills, chest pain, increased trouble breathing, bowel bladder incontinence or saddle anesthesia.  Patient walks with a cane.  She did mention missing her previous dialysis session 2 days ago when the bus did not show up and she did not have a ride.  She has been without dialysis for the past 5 to 6 days.  No complaints of headache, or neck pain.  Past Medical History:  Diagnosis Date  . Abscess   . Acute blood loss anemia 08/17/2019  . Acute respiratory failure ([place]) 10/18/2014  . Acute respiratory failure with hypoxia and hypercapnia ([place]) 06/01/2019  . Anemia 08/2016  . Angiodysplasia of colon   . Arthritis of left shoulder region 03/23/2013  . Bleeding gastrointestinal   . Cardiomegaly 05/2019  . Chest pain 04/17/2016  . Chronic combined systolic and diastolic CHF (congestive heart failure) (HCC)    a.  EF 40-45%, mild LVH, mid apicalanteroseptal and apical HK.  . CKD (chronic kidney disease), stage III   . Cocaine abuse ([place])    crack cocaine heavily until 2008 then sporadic use since then  . Coronary artery disease    a. 06/2012 NSTEMI/CABG x 3 (LIMA->LAD, VG->OM2, VG->LCX);  b. 04/2015 MV: EF<30%, mid ant, apicalanterior, apical infarct;  c. 04/2015 Cath: LM nl, LAD 90p, LCX 34m, OM1 min irregs, RCA mild dzs, LIMA->LAD nl w/ dist LAD dzs, VG->OM2 nl, VG->LCX nl-->Med Rx.  . CVA (cerebral infarction)    a. right internal capsule stroke in 12/2006  . Demand ischemia ([place])   . Diabetes mellitus    diagnosed in 2008  . Elevated troponin 04/27/2019  . Essential hypertension   . Glaucoma   . Gout   . Heme positive stool   . HFrEF (heart failure with reduced ejection fraction) ([place])   . Hyperlipidemia   . Hyperparathyroidism, secondary renal ([place])   . Hypertensive crisis 06/02/2019  . Left-sided sensory deficit present   . Lobar pneumonia ([place]) 04/27/2019  . Obesity, morbid ([place])   . Pneumonia   . Pulmonary edema 05/2019  . PVD (peripheral vascular disease) ([place])    a. 06/2012 ABI's: R - 0.73, L - 0.71.  Marland Kitchen Renal mass, right   . Sepsis ([place]) 04/27/2019  . Shortness of breath dyspnea   . Stroke ([place])   .  Thrombocytosis ([place]) 04/17/2016  . Thyroid nodule    FNA in 3419 showed follicular cells but not definate neoplasm  . Tobacco abuse   . Trichomoniasis     Patient Active Problem List   Diagnosis Date Noted  . Recurrent falls 10/09/2019  . Generalized weakness 10/09/2019  . Unspecified atrial fibrillation ([place]) 10/08/2019  . Acute GI bleeding 10/05/2019  . Hyperkalemia   . Depression   . Esophageal stricture   . Diabetic gastroparesis ([place])   . Upper GI bleed 08/16/2019  . Hiatal hernia   . Iron deficiency anemia   . Microcytic anemia 02/05/2016  . Acute on chronic renal failure ([place])   . CAD in native artery   . Substance abuse ([place])   . Chronic combined systolic and diastolic  CHF (congestive heart failure) ([place])   . ESRD (end stage renal disease) ([place])   . Obesity, Class III, BMI 40-49.9 (morbid obesity) ([place])   . Type 2 diabetes mellitus with chronic kidney disease on chronic dialysis, with long-term current use of insulin ([place]) 10/18/2014  . S/P CABG (coronary artery bypass graft) 09/21/2013  . Arthritis of right shoulder region 03/23/2013  . Tobacco abuse   . Coronary artery disease   . NSTEMI (non-ST elevated myocardial infarction) ([place]) 06/29/2012  . History of cocaine abuse ([place]) 06/29/2012  . Essential hypertension 06/29/2012  . Glaucoma   . Hyperlipidemia   . History of CVA (cerebrovascular accident)     Past Surgical History:  Procedure Laterality Date  . AV FISTULA PLACEMENT Left 08/25/2019   Procedure: ARTERIOVENOUS (AV) BRACHIOCEPHALIC FISTULA CREATION;  Surgeon: Waynetta Sandy, MD;  Location: [facility];  Service: Vascular;  Laterality: Left;  . CARDIAC CATHETERIZATION    . CARDIAC CATHETERIZATION N/A 05/17/2015   Procedure: Left Heart Cath and Cors/Grafts Angiography;  Surgeon: [name], MD;  Location: [facility] CV LAB;  Service: Cardiovascular;  Laterality: N/A;  . COLONOSCOPY WITH PROPOFOL N/A 04/21/2016   Procedure: COLONOSCOPY WITH PROPOFOL;  Surgeon: Irene Shipper, MD;  Location: [facility];  Service: Endoscopy;  Laterality: N/A;  . CORONARY ARTERY BYPASS GRAFT  07/09/2012   Procedure: CORONARY ARTERY BYPASS GRAFTING (CABG);  Surgeon: Ivin Poot, MD;  Location: [facility];  Service: Open Heart Surgery;  Laterality: N/A;  . ENTEROSCOPY N/A 10/07/2019   Procedure: ENTEROSCOPY;  Surgeon: Jackquline Denmark, MD;  Location: [facility] ENDOSCOPY;  Service: Endoscopy;  Laterality: N/A;  . ESOPHAGOGASTRODUODENOSCOPY N/A 04/20/2016   Procedure: ESOPHAGOGASTRODUODENOSCOPY (EGD);  Surgeon: Gatha Mayer, MD;  Location: [facility] ENDOSCOPY;  Service: Endoscopy;  Laterality: N/A;  . ESOPHAGOGASTRODUODENOSCOPY (EGD) WITH PROPOFOL N/A 08/17/2019   Procedure:  ESOPHAGOGASTRODUODENOSCOPY (EGD) WITH PROPOFOL;  Surgeon: Irene Shipper, MD;  Location: [facility]COPY;  Service: Endoscopy;  Laterality: N/A;  . INSERTION OF DIALYSIS CATHETER Right 08/25/2019   Procedure: INSERTION OF DIALYSIS CATHETER;  Surgeon: Waynetta Sandy, MD;  Location: [facility];  Service: Vascular;  Laterality: Right;  . IR FLUORO GUIDE CV LINE RIGHT  08/19/2019  . IR US GUIDE VASC ACCESS RIGHT  08/19/2019  . LEFT HEART CATHETERIZATION WITH CORONARY ANGIOGRAM N/A 06/29/2012   Procedure: LEFT HEART CATHETERIZATION WITH CORONARY ANGIOGRAM;  Surgeon: Peter M Martinique, MD;  Location: [facility] CATH LAB;  Service: Cardiovascular;  Laterality: N/A;  . STERNAL WOUND DEBRIDEMENT  08/17/2012   Procedure: STERNAL WOUND DEBRIDEMENT;  Surgeon: Ivin Poot, MD;  Location: [facility] OR;  Service: Thoracic;  Laterality: N/A;  wound vac application  . STERNAL WOUND DEBRIDEMENT  08/24/2012  Procedure: STERNAL WOUND DEBRIDEMENT;  Surgeon: Ivin Poot, MD;  Location: [facility];  Service: Thoracic;  Laterality: N/A;  . STERNAL WOUND DEBRIDEMENT  09/01/2012   Procedure: STERNAL WOUND DEBRIDEMENT;  Surgeon: Ivin Poot, MD;  Location: [facility];  Service: Thoracic;  Laterality: N/A;  . STERNAL WOUND DEBRIDEMENT  09/20/2012   Procedure: STERNAL WOUND DEBRIDEMENT;  Surgeon: Ivin Poot, MD;  Location: [facility] OR;  Service: Thoracic;  Laterality: N/A;  wound vac change  . SUBMUCOSAL TATTOO INJECTION  10/07/2019   Procedure: SUBMUCOSAL TATTOO INJECTION;  Surgeon: Jackquline Denmark, MD;  Location: [facility] ENDOSCOPY;  Service: Endoscopy;;     OB History   No obstetric history on file.      Home Medications    Prior to Admission medications   Medication Sig Start Date End Date Taking? Authorizing Provider  allopurinol (ZYLOPRIM) 100 MG tablet Take 100 mg by mouth 2 (two) times daily.  11/25/17   [provider]  brinzolamide (AZOPT) 1 % ophthalmic suspension Place 1 drop into both eyes 2 (two) times a day.     [provider]  buPROPion (WELLBUTRIN SR) 150 MG 12 hr tablet Take 1 tablet (150 mg total) by mouth 2 (two) times daily. 01/16/16   Mikhail, Velta Addison, DO  cetirizine (ZYRTEC) 10 MG tablet Take 10 mg by mouth daily.     [provider]  ferrous sulfate 325 (65 FE) MG tablet Take 325 mg by mouth 2 (two) times daily with a meal.     [provider]  gabapentin (NEURONTIN) 300 MG capsule Take 1 capsule (300 mg total) by mouth daily. 08/26/19 10/05/19  Jeanmarie Hubert, MD  insulin aspart (NOVOLOG) 100 UNIT/ML injection Inject 0-15 Units into the skin 3 (three) times daily with meals. 10/11/19   Nicole Kindred A, DO  ipratropium (ATROVENT HFA) 17 MCG/ACT inhaler Inhale 2 puffs into the lungs every 6 (six) hours as needed for wheezing.    [provider]  latanoprost (XALATAN) 0.005 % ophthalmic solution Place 1 drop into both eyes at bedtime.    [provider]  LINZESS 72 MCG capsule Take 72 mcg by mouth daily before breakfast.  11/21/17   [provider]  metoprolol tartrate (LOPRESSOR) 25 MG tablet Take 0.5 tablets (12.5 mg total) by mouth 2 (two) times daily. 10/11/19   Ezekiel Slocumb, DO  mometasone-formoterol (DULERA) 100-5 MCG/ACT AERO Inhale 2 puffs into the lungs every 4 (four) hours as needed for wheezing.    [provider]  nitroGLYCERIN (NITROSTAT) 0.4 MG SL tablet Place 1 tablet (0.4 mg total) under the tongue every 5 (five) minutes as needed for chest pain. 04/30/19   [name], MD  pantoprazole (PROTONIX) 40 MG tablet Take 1 tablet (40 mg total) by mouth daily. 08/26/19   Jeanmarie Hubert, MD  polyethylene glycol (MIRALAX / GLYCOLAX) 17 g packet Take 17 g by mouth daily as needed for mild constipation.     [provider]  PROAIR HFA 108 [phone] Base) MCG/ACT inhaler Inhale 2 puffs into the lungs every 6 (six) hours as needed for wheezing.  11/17/17   [provider]  Grant Ruts INHUB 250-50 MCG/DOSE AEPB Inhale 2 puffs into the  lungs daily.  05/04/19   [provider]    Family History Family History  Problem Relation Age of Onset  . Diabetes Mother   . Hypertension Mother   . Cancer Mother   . Hyperlipidemia Father   . Hypertension Father   .  Kidney disease Father   . Gout Father   . Cerebrovascular Accident Father   . Other Other        no known family CAD    Social History Social History   Tobacco Use  . Smoking status: Current Some Day Smoker    Packs/day: 0.25    Years: 50.00    Pack years: 12.50    Types: Cigarettes  . Smokeless tobacco: Never Used  Substance Use Topics  . Alcohol use: No    Alcohol/week: 0.0 standard drinks  . Drug use: Yes    Types: Cocaine    Comment: pt denies at current time     Allergies   Naproxen   Review of Systems Review of Systems  Neurological: Positive for weakness.  All other systems reviewed and are negative.    Physical Exam Updated Vital Signs BP (!) 118/44   Pulse 65   Temp 98.7 F (37.1 C) (Oral)   Resp 18   SpO2 97%   Physical Exam Vitals signs and nursing note reviewed.  Constitutional:      General: She is not in acute distress.    Appearance: She is well-developed. She is obese.  HENT:     Head: Atraumatic.  Eyes:     Conjunctiva/sclera: Conjunctivae normal.  Neck:     Musculoskeletal: Neck supple.  Cardiovascular:     Rate and Rhythm: Normal rate and regular rhythm.     Pulses: Normal pulses.     Heart sounds: Normal heart sounds.  Pulmonary:     Breath sounds: No wheezing, rhonchi or rales.  Abdominal:     Palpations: Abdomen is soft.  Musculoskeletal:        General: Swelling (Trace edema to bilateral lower extremities.) present.     Comments: 4/5 strength to bilateral lower extremities left worse than right with intact patellar deep tendon reflex and no foot drop.  Sensation is intact bilaterally.  Skin:    Capillary Refill: Capillary refill takes 2 to 3 seconds.     Findings: No rash.  Neurological:      Mental Status: She is alert and oriented to person, place, and time.  Psychiatric:        Mood and Affect: Mood normal.      ED Treatments / Results  Labs (all labs ordered are listed, but only abnormal results are displayed) Labs Reviewed  COMPREHENSIVE METABOLIC PANEL - Abnormal; Notable for the following components:      Result Value   Potassium 5.8 (*)    Glucose, Bld 184 (*)    BUN 104 (*)    Creatinine, Ser 9.60 (*)    Calcium 8.2 (*)    Total Protein 6.4 (*)    Albumin 3.2 (*)    Alkaline Phosphatase 147 (*)    Total Bilirubin 0.2 (*)    GFR calc non Af Amer 4 (*)    GFR calc Af Amer 4 (*)    All other components within normal limits  CBC WITH DIFFERENTIAL/PLATELET - Abnormal; Notable for the following components:   RBC 3.10 (*)    Hemoglobin 8.9 (*)    HCT 29.0 (*)    RDW 17.3 (*)    All other components within normal limits    EKG None  Radiology No results found.  Procedures Procedures (including critical care time)  Medications Ordered in ED Medications  sodium zirconium cyclosilicate (LOKELMA) packet 10 g (has no administration in time range)     Initial Impression /  Assessment and Plan / ED Course  I have reviewed the triage vital signs and the nursing notes.  Pertinent labs & imaging results that were available during my care of the patient were reviewed by me and considered in my medical decision making (see chart for details).  Clinical Course as of Oct 27 1451  Thu Oct 26, 6065  [id number] 68 year old female with end-stage renal disease missed a couple of dialysis appointments here with bilateral leg weakness while trying to walk to get to the bus to go to dialysis.  She says her legs have given her troubles for about 10 years.  Her testing shows some mild hyperkalemia of 5.8.  If we can get her up and ambulatory we will try to get her to dialysis.   [MB]    Clinical Course User Index [MB] Hayden Rasmussen, MD       BP (!) 118/44   Pulse  65   Temp 98.7 F (37.1 C) (Oral)   Resp 18   SpO2 97%    Final Clinical Impressions(s) / ED Diagnoses   Final diagnoses:  General weakness  Hyperkalemia  Dialysis patient, noncompliant [facility])    ED Discharge Orders    None     12:57 PM Patient is a dialysis patient who missed dialysis today when she was unable to walk to the bus on time when her leg gave out on her.  She did not fall.  She complains of bilateral leg weakness and pain left greater than right.  She has history of stroke in the past.  However, she does not have any true focal neuro deficit on my examination.  Labs remarkable for elevated potassium of 5.8 likely from missed dialysis.  Elevated potassium certainly can contribute to her muscle weakness.  Hemoglobin of 8.9, at baseline.  2:52 PM Nephrology has been consulted.  Recommend for patient to receive Lokelma 10 g prior to discharge and she can have outpatient dialysis session tomorrow at 10 AM.  Care discussed with Dr. Melina Copa.  Pt able to ambulate with her cane.    Domenic Moras, PA-C 10/27/19 1518    Hayden Rasmussen, MD 10/27/19 [phone]

## 2019-10-27 NOTE — ED Notes (Signed)
Pt reports she was unable to complete dialysis today due to Left leg weakness. She is requesting to have dialysis treatment here. Usual treatments T/Th/Sat

## 2019-10-27 NOTE — Discharge Instructions (Addendum)
Please follow-up at your dialysis session tomorrow for dialysis.

## 2019-10-27 NOTE — ED Triage Notes (Signed)
Pt to ER for evaluation of right leg weakness onset per patient this morning, reports was walking to dialysis transport bus and couldn't make it . EMS reports did not do a stroke screen because she was ambulatory. After speaking with the patient, she reports she had a stroke "years ago" that left her leg weak. She is a.o x4. In NAD.

## 2019-10-27 NOTE — ED Notes (Signed)
Reports released from nursing facility where she was for PT for weakness of this left leg, on Monday. Reports was doing well with walking in the facility but since being home, can't.

## 2019-11-10 ENCOUNTER — Ambulatory Visit: Payer: Medicare Other | Admitting: Physician Assistant

## 2019-12-07 ENCOUNTER — Other Ambulatory Visit: Payer: Self-pay

## 2019-12-07 ENCOUNTER — Encounter (HOSPITAL_COMMUNITY): Payer: Self-pay | Admitting: Emergency Medicine

## 2019-12-07 ENCOUNTER — Ambulatory Visit: Payer: Medicare Other | Admitting: Physician Assistant

## 2019-12-07 ENCOUNTER — Inpatient Hospital Stay (HOSPITAL_COMMUNITY)
Admission: EM | Admit: 2019-12-07 | Discharge: 2019-12-12 | DRG: 640 | Disposition: A | Discharge status: Home Health | Payer: Medicare Other | Attending: Internal Medicine | Admitting: Internal Medicine

## 2019-12-07 DIAGNOSIS — I10 Essential (primary) hypertension: Secondary | ICD-10-CM | POA: Diagnosis present

## 2019-12-07 DIAGNOSIS — F419 Anxiety disorder, unspecified: Secondary | ICD-10-CM | POA: Diagnosis present

## 2019-12-07 DIAGNOSIS — F17210 Nicotine dependence, cigarettes, uncomplicated: Secondary | ICD-10-CM | POA: Diagnosis present

## 2019-12-07 DIAGNOSIS — I69354 Hemiplegia and hemiparesis following cerebral infarction affecting left non-dominant side: Secondary | ICD-10-CM

## 2019-12-07 DIAGNOSIS — H409 Unspecified glaucoma: Secondary | ICD-10-CM | POA: Diagnosis present

## 2019-12-07 DIAGNOSIS — Z841 Family history of disorders of kidney and ureter: Secondary | ICD-10-CM

## 2019-12-07 DIAGNOSIS — F1411 Cocaine abuse, in remission: Secondary | ICD-10-CM | POA: Diagnosis present

## 2019-12-07 DIAGNOSIS — I132 Hypertensive heart and chronic kidney disease with heart failure and with stage 5 chronic kidney disease, or end stage renal disease: Secondary | ICD-10-CM | POA: Diagnosis present

## 2019-12-07 DIAGNOSIS — E785 Hyperlipidemia, unspecified: Secondary | ICD-10-CM | POA: Diagnosis present

## 2019-12-07 DIAGNOSIS — N2581 Secondary hyperparathyroidism of renal origin: Secondary | ICD-10-CM | POA: Diagnosis present

## 2019-12-07 DIAGNOSIS — I440 Atrioventricular block, first degree: Secondary | ICD-10-CM | POA: Diagnosis present

## 2019-12-07 DIAGNOSIS — K219 Gastro-esophageal reflux disease without esophagitis: Secondary | ICD-10-CM | POA: Diagnosis present

## 2019-12-07 DIAGNOSIS — R29898 Other symptoms and signs involving the musculoskeletal system: Secondary | ICD-10-CM

## 2019-12-07 DIAGNOSIS — Z9115 Patient's noncompliance with renal dialysis: Secondary | ICD-10-CM

## 2019-12-07 DIAGNOSIS — Z6841 Body Mass Index (BMI) 40.0 and over, adult: Secondary | ICD-10-CM

## 2019-12-07 DIAGNOSIS — I2510 Atherosclerotic heart disease of native coronary artery without angina pectoris: Secondary | ICD-10-CM | POA: Diagnosis present

## 2019-12-07 DIAGNOSIS — E1140 Type 2 diabetes mellitus with diabetic neuropathy, unspecified: Secondary | ICD-10-CM | POA: Diagnosis present

## 2019-12-07 DIAGNOSIS — E875 Hyperkalemia: Secondary | ICD-10-CM | POA: Diagnosis not present

## 2019-12-07 DIAGNOSIS — Z7982 Long term (current) use of aspirin: Secondary | ICD-10-CM

## 2019-12-07 DIAGNOSIS — Z8249 Family history of ischemic heart disease and other diseases of the circulatory system: Secondary | ICD-10-CM

## 2019-12-07 DIAGNOSIS — Z833 Family history of diabetes mellitus: Secondary | ICD-10-CM

## 2019-12-07 DIAGNOSIS — Z823 Family history of stroke: Secondary | ICD-10-CM

## 2019-12-07 DIAGNOSIS — Z951 Presence of aortocoronary bypass graft: Secondary | ICD-10-CM

## 2019-12-07 DIAGNOSIS — Z794 Long term (current) use of insulin: Secondary | ICD-10-CM

## 2019-12-07 DIAGNOSIS — K5900 Constipation, unspecified: Secondary | ICD-10-CM | POA: Diagnosis present

## 2019-12-07 DIAGNOSIS — I252 Old myocardial infarction: Secondary | ICD-10-CM

## 2019-12-07 DIAGNOSIS — M109 Gout, unspecified: Secondary | ICD-10-CM | POA: Diagnosis present

## 2019-12-07 DIAGNOSIS — Z886 Allergy status to analgesic agent status: Secondary | ICD-10-CM

## 2019-12-07 DIAGNOSIS — Z992 Dependence on renal dialysis: Secondary | ICD-10-CM

## 2019-12-07 DIAGNOSIS — Z8349 Family history of other endocrine, nutritional and metabolic diseases: Secondary | ICD-10-CM

## 2019-12-07 DIAGNOSIS — R296 Repeated falls: Secondary | ICD-10-CM | POA: Diagnosis present

## 2019-12-07 DIAGNOSIS — Z8673 Personal history of transient ischemic attack (TIA), and cerebral infarction without residual deficits: Secondary | ICD-10-CM | POA: Diagnosis not present

## 2019-12-07 DIAGNOSIS — N186 End stage renal disease: Secondary | ICD-10-CM | POA: Diagnosis present

## 2019-12-07 DIAGNOSIS — R2689 Other abnormalities of gait and mobility: Secondary | ICD-10-CM | POA: Diagnosis present

## 2019-12-07 DIAGNOSIS — E1151 Type 2 diabetes mellitus with diabetic peripheral angiopathy without gangrene: Secondary | ICD-10-CM | POA: Diagnosis present

## 2019-12-07 DIAGNOSIS — Z809 Family history of malignant neoplasm, unspecified: Secondary | ICD-10-CM

## 2019-12-07 DIAGNOSIS — Z20828 Contact with and (suspected) exposure to other viral communicable diseases: Secondary | ICD-10-CM | POA: Diagnosis present

## 2019-12-07 DIAGNOSIS — I5042 Chronic combined systolic (congestive) and diastolic (congestive) heart failure: Secondary | ICD-10-CM | POA: Diagnosis present

## 2019-12-07 DIAGNOSIS — J45909 Unspecified asthma, uncomplicated: Secondary | ICD-10-CM | POA: Diagnosis present

## 2019-12-07 DIAGNOSIS — R531 Weakness: Secondary | ICD-10-CM | POA: Diagnosis not present

## 2019-12-07 DIAGNOSIS — E1122 Type 2 diabetes mellitus with diabetic chronic kidney disease: Secondary | ICD-10-CM | POA: Diagnosis present

## 2019-12-07 LAB — CBC WITH DIFFERENTIAL/PLATELET
Abs Immature Granulocytes: 0.03 10*3/uL (ref 0.00–0.07)
Basophils Absolute: 0 10*3/uL (ref 0.0–0.1)
Basophils Relative: 0 %
Eosinophils Absolute: 0.1 10*3/uL (ref 0.0–0.5)
Eosinophils Relative: 1 %
HCT: 33.7 % — ABNORMAL LOW (ref 36.0–46.0)
Hemoglobin: 10.3 g/dL — ABNORMAL LOW (ref 12.0–15.0)
Immature Granulocytes: 0 %
Lymphocytes Relative: 15 %
Lymphs Abs: 1 10*3/uL (ref 0.7–4.0)
MCH: 28.1 pg (ref 26.0–34.0)
MCHC: 30.6 g/dL (ref 30.0–36.0)
MCV: 91.8 fL (ref 80.0–100.0)
Monocytes Absolute: 0.5 10*3/uL (ref 0.1–1.0)
Monocytes Relative: 8 %
Neutro Abs: 5 10*3/uL (ref 1.7–7.7)
Neutrophils Relative %: 76 %
Platelets: 308 10*3/uL (ref 150–400)
RBC: 3.67 MIL/uL — ABNORMAL LOW (ref 3.87–5.11)
RDW: 18.1 % — ABNORMAL HIGH (ref 11.5–15.5)
WBC: 6.7 10*3/uL (ref 4.0–10.5)
nRBC: 0 % (ref 0.0–0.2)

## 2019-12-07 LAB — BASIC METABOLIC PANEL
Anion gap: 17 — ABNORMAL HIGH (ref 5–15)
BUN: 95 mg/dL — ABNORMAL HIGH (ref 8–23)
CO2: 25 mmol/L (ref 22–32)
Calcium: 8.2 mg/dL — ABNORMAL LOW (ref 8.9–10.3)
Chloride: 97 mmol/L — ABNORMAL LOW (ref 98–111)
Creatinine, Ser: 9.39 mg/dL — ABNORMAL HIGH (ref 0.44–1.00)
GFR calc Af Amer: 4 mL/min — ABNORMAL LOW (ref >60)
GFR calc non Af Amer: 4 mL/min — ABNORMAL LOW (ref >60)
Glucose, Bld: 181 mg/dL — ABNORMAL HIGH (ref 70–99)
Potassium: 5.9 mmol/L — ABNORMAL HIGH (ref 3.5–5.1)
Sodium: 139 mmol/L (ref 135–145)

## 2019-12-07 LAB — POC SARS CORONAVIRUS 2 AG -  ED: SARS Coronavirus 2 Ag: NEGATIVE

## 2019-12-07 MED ORDER — SODIUM POLYSTYRENE SULFONATE 15 GM/60ML PO SUSP
30.0000 g | Freq: Once | ORAL | Status: AC
  Administered 2019-12-08: 30 g via ORAL
  Filled 2019-12-07: qty 120

## 2019-12-07 MED ORDER — HEPARIN SODIUM (PORCINE) 5000 UNIT/ML IJ SOLN
5000.0000 [IU] | Freq: Three times a day (TID) | INTRAMUSCULAR | Status: DC
  Administered 2019-12-08 – 2019-12-12 (×12): 5000 [IU] via SUBCUTANEOUS
  Filled 2019-12-07 (×12): qty 1

## 2019-12-07 MED ORDER — INSULIN ASPART 100 UNIT/ML ~~LOC~~ SOLN
0.0000 [IU] | Freq: Every day | SUBCUTANEOUS | Status: DC
  Administered 2019-12-10: 3 [IU] via SUBCUTANEOUS
  Administered 2019-12-11: 4 [IU] via SUBCUTANEOUS

## 2019-12-07 MED ORDER — OXYCODONE HCL 5 MG PO TABS
2.5000 mg | ORAL_TABLET | Freq: Once | ORAL | Status: AC
  Administered 2019-12-07: 12:00:00 2.5 mg via ORAL
  Filled 2019-12-07: qty 1

## 2019-12-07 MED ORDER — ACETAMINOPHEN 325 MG PO TABS: 650.0000 mg | ORAL_TABLET | Freq: Four times a day (QID) | ORAL | Status: DC | PRN

## 2019-12-07 MED ORDER — ACETAMINOPHEN 650 MG RE SUPP: 650.0000 mg | Freq: Four times a day (QID) | RECTAL | Status: DC | PRN

## 2019-12-07 MED ORDER — SODIUM BICARBONATE 8.4 % IV SOLN
50.0000 meq | Freq: Once | INTRAVENOUS | Status: AC
  Administered 2019-12-07: 50 meq via INTRAVENOUS
  Filled 2019-12-07: qty 50

## 2019-12-07 MED ORDER — ACETAMINOPHEN 500 MG PO TABS
1000.0000 mg | ORAL_TABLET | Freq: Once | ORAL | Status: AC
  Administered 2019-12-07: 1000 mg via ORAL
  Filled 2019-12-07: qty 2

## 2019-12-07 MED ORDER — CALCIUM GLUCONATE-NACL 1-0.675 GM/50ML-% IV SOLN
1.0000 g | Freq: Once | INTRAVENOUS | Status: AC
  Administered 2019-12-07: 1000 mg via INTRAVENOUS
  Filled 2019-12-07: qty 50

## 2019-12-07 MED ORDER — INSULIN ASPART 100 UNIT/ML ~~LOC~~ SOLN
0.0000 [IU] | Freq: Three times a day (TID) | SUBCUTANEOUS | Status: DC
  Administered 2019-12-09: 3 [IU] via SUBCUTANEOUS
  Administered 2019-12-10: 1 [IU] via SUBCUTANEOUS
  Administered 2019-12-11: 3 [IU] via SUBCUTANEOUS
  Administered 2019-12-11 – 2019-12-12 (×2): 1 [IU] via SUBCUTANEOUS

## 2019-12-07 NOTE — ED Provider Notes (Addendum)
[facility] EMERGENCY DEPARTMENT Provider Note   CSN: [id number] Arrival date & time: 12/07/19  1035     History   Chief Complaint Chief Complaint  Patient presents with  . Fall    HPI [name] [name] is a 68 y.o. female.     68 yo F with a chief complaints of leg weakness.  She states has been going on for about 10 years or so.  Worsening over the past week.  Today felt that she was too weak to get up and walk and try to walk and fell to the ground.  States she landed on her bottom.  Denies any injury in the fall.  Denies head injury denies loss consciousness denies chest pain back pain abdominal pain.  She states she has had problems with the legs for a while.  She feels they are equally weak.  She does have pain in the left leg.  The history is provided by the patient.  Fall This is a chronic problem. The current episode started 2 days ago. The problem occurs constantly. The problem has not changed since onset.Pertinent negatives include no chest pain, no headaches and no shortness of breath. The symptoms are aggravated by bending, twisting and walking. Nothing relieves the symptoms. She has tried nothing for the symptoms. The treatment provided no relief.    Past Medical History:  Diagnosis Date  . Abscess   . Acute blood loss anemia 08/17/2019  . Acute respiratory failure ([place]) 10/18/2014  . Acute respiratory failure with hypoxia and hypercapnia ([place]) 06/01/2019  . Anemia 08/2016  . Angiodysplasia of colon   . Arthritis of left shoulder region 03/23/2013  . Bleeding gastrointestinal   . Cardiomegaly 05/2019  . Chest pain 04/17/2016  . Chronic combined systolic and diastolic CHF (congestive heart failure) (HCC)    a. EF 40-45%, mild LVH, mid apicalanteroseptal and apical HK.  . CKD (chronic kidney disease), stage III   . Cocaine abuse ([place])    crack cocaine heavily until 2008 then sporadic use since then  . Coronary artery disease    a. 06/2012 NSTEMI/CABG  x 3 (LIMA->LAD, VG->OM2, VG->LCX);  b. 04/2015 MV: EF<30%, mid ant, apicalanterior, apical infarct;  c. 04/2015 Cath: LM nl, LAD 90p, LCX 58m, OM1 min irregs, RCA mild dzs, LIMA->LAD nl w/ dist LAD dzs, VG->OM2 nl, VG->LCX nl-->Med Rx.  . CVA (cerebral infarction)    a. right internal capsule stroke in 12/2006  . Demand ischemia ([place])   . Diabetes mellitus    diagnosed in 2008  . Elevated troponin 04/27/2019  . Essential hypertension   . Glaucoma   . Gout   . Heme positive stool   . HFrEF (heart failure with reduced ejection fraction) ([place])   . Hyperlipidemia   . Hyperparathyroidism, secondary renal ([place])   . Hypertensive crisis 06/02/2019  . Left-sided sensory deficit present   . Lobar pneumonia ([place]) 04/27/2019  . Obesity, morbid ([place])   . Pneumonia   . Pulmonary edema 05/2019  . PVD (peripheral vascular disease) ([place])    a. 06/2012 ABI's: R - 0.73, L - 0.71.  Marland Kitchen Renal mass, right   . Sepsis ([place]) 04/27/2019  . Shortness of breath dyspnea   . Stroke ([place])   . Thrombocytosis ([place]) 04/17/2016  . Thyroid nodule    FNA in 1601 showed follicular cells but not definate neoplasm  . Tobacco abuse   . Trichomoniasis     Patient Active Problem List   Diagnosis  Date Noted  . Recurrent falls 10/09/2019  . Generalized weakness 10/09/2019  . Unspecified atrial fibrillation ([place]) 10/08/2019  . Acute GI bleeding 10/05/2019  . Hyperkalemia   . Depression   . Esophageal stricture   . Diabetic gastroparesis ([place])   . Upper GI bleed 08/16/2019  . Hiatal hernia   . Iron deficiency anemia   . Microcytic anemia 02/05/2016  . Acute on chronic renal failure ([place])   . CAD in native artery   . Substance abuse ([place])   . Chronic combined systolic and diastolic CHF (congestive heart failure) ([place])   . ESRD (end stage renal disease) ([place])   . Obesity, Class III, BMI 40-49.9 (morbid obesity) ([place])   . Type 2 diabetes mellitus with chronic kidney disease on chronic dialysis, with long-term current use of  insulin ([place]) 10/18/2014  . S/P CABG (coronary artery bypass graft) 09/21/2013  . Arthritis of right shoulder region 03/23/2013  . Tobacco abuse   . Coronary artery disease   . NSTEMI (non-ST elevated myocardial infarction) ([place]) 06/29/2012  . History of cocaine abuse ([place]) 06/29/2012  . Essential hypertension 06/29/2012  . Glaucoma   . Hyperlipidemia   . History of CVA (cerebrovascular accident)     Past Surgical History:  Procedure Laterality Date  . AV FISTULA PLACEMENT Left 08/25/2019   Procedure: ARTERIOVENOUS (AV) BRACHIOCEPHALIC FISTULA CREATION;  Surgeon: Waynetta Sandy, MD;  Location: [facility];  Service: Vascular;  Laterality: Left;  . CARDIAC CATHETERIZATION    . CARDIAC CATHETERIZATION N/A 05/17/2015   Procedure: Left Heart Cath and Cors/Grafts Angiography;  Surgeon: [name], MD;  Location: [facility] CV LAB;  Service: Cardiovascular;  Laterality: N/A;  . COLONOSCOPY WITH PROPOFOL N/A 04/21/2016   Procedure: COLONOSCOPY WITH PROPOFOL;  Surgeon: Irene Shipper, MD;  Location: [facility];  Service: Endoscopy;  Laterality: N/A;  . CORONARY ARTERY BYPASS GRAFT  07/09/2012   Procedure: CORONARY ARTERY BYPASS GRAFTING (CABG);  Surgeon: Ivin Poot, MD;  Location: [facility];  Service: Open Heart Surgery;  Laterality: N/A;  . ENTEROSCOPY N/A 10/07/2019   Procedure: ENTEROSCOPY;  Surgeon: Jackquline Denmark, MD;  Location: [facility] ENDOSCOPY;  Service: Endoscopy;  Laterality: N/A;  . ESOPHAGOGASTRODUODENOSCOPY N/A 04/20/2016   Procedure: ESOPHAGOGASTRODUODENOSCOPY (EGD);  Surgeon: Gatha Mayer, MD;  Location: [facility] ENDOSCOPY;  Service: Endoscopy;  Laterality: N/A;  . ESOPHAGOGASTRODUODENOSCOPY (EGD) WITH PROPOFOL N/A 08/17/2019   Procedure: ESOPHAGOGASTRODUODENOSCOPY (EGD) WITH PROPOFOL;  Surgeon: Irene Shipper, MD;  Location: [facility] ENDOSCOPY;  Service: Endoscopy;  Laterality: N/A;  . INSERTION OF DIALYSIS CATHETER Right 08/25/2019   Procedure: INSERTION OF DIALYSIS CATHETER;  Surgeon:  Waynetta Sandy, MD;  Location: [facility];  Service: Vascular;  Laterality: Right;  . IR FLUORO GUIDE CV LINE RIGHT  08/19/2019  . IR US GUIDE VASC ACCESS RIGHT  08/19/2019  . LEFT HEART CATHETERIZATION WITH CORONARY ANGIOGRAM N/A 06/29/2012   Procedure: LEFT HEART CATHETERIZATION WITH CORONARY ANGIOGRAM;  Surgeon: Peter M Martinique, MD;  Location: [facility] CATH LAB;  Service: Cardiovascular;  Laterality: N/A;  . STERNAL WOUND DEBRIDEMENT  08/17/2012   Procedure: STERNAL WOUND DEBRIDEMENT;  Surgeon: Ivin Poot, MD;  Location: [facility] OR;  Service: Thoracic;  Laterality: N/A;  wound vac application  . STERNAL WOUND DEBRIDEMENT  08/24/2012   Procedure: STERNAL WOUND DEBRIDEMENT;  Surgeon: Ivin Poot, MD;  Location: [facility];  Service: Thoracic;  Laterality: N/A;  . STERNAL WOUND DEBRIDEMENT  09/01/2012   Procedure: STERNAL WOUND DEBRIDEMENT;  Surgeon: Tharon Aquas Trigt,  MD;  Location: [facility];  Service: Thoracic;  Laterality: N/A;  . STERNAL WOUND DEBRIDEMENT  09/20/2012   Procedure: STERNAL WOUND DEBRIDEMENT;  Surgeon: Ivin Poot, MD;  Location: [facility] OR;  Service: Thoracic;  Laterality: N/A;  wound vac change  . SUBMUCOSAL TATTOO INJECTION  10/07/2019   Procedure: SUBMUCOSAL TATTOO INJECTION;  Surgeon: Jackquline Denmark, MD;  Location: [facility] ENDOSCOPY;  Service: Endoscopy;;     OB History   No obstetric history on file.      Home Medications    Prior to Admission medications   Medication Sig Start Date End Date Taking? Authorizing Provider  allopurinol (ZYLOPRIM) 100 MG tablet Take 100 mg by mouth 2 (two) times daily.  11/25/17  Yes [provider]  aspirin EC 325 MG tablet Take 325 mg by mouth daily. 10/28/19  Yes [provider]  brinzolamide (AZOPT) 1 % ophthalmic suspension Place 1 drop into both eyes 2 (two) times a day.    Yes [provider]  buPROPion (WELLBUTRIN SR) 150 MG 12 hr tablet Take 1 tablet (150 mg total) by mouth 2 (two) times daily. 01/16/16  Yes Mikhail,  Velta Addison, DO  cetirizine (ZYRTEC) 10 MG tablet Take 10 mg by mouth daily as needed for allergies.    Yes [provider]  ferrous sulfate 325 (65 FE) MG tablet Take 325 mg by mouth 2 (two) times daily with a meal.    Yes [provider]  furosemide (LASIX) 40 MG tablet Take 40 mg by mouth 2 (two) times daily. 10/28/19  Yes [provider]  gabapentin (NEURONTIN) 300 MG capsule Take 1 capsule (300 mg total) by mouth daily. Patient taking differently: Take 300 mg by mouth 2 (two) times daily.  08/26/19 12/07/19 Yes Jeanmarie Hubert, MD  insulin aspart (NOVOLOG) 100 UNIT/ML injection Inject 0-15 Units into the skin 3 (three) times daily with meals. Patient taking differently: Inject 2 Units into the skin 3 (three) times daily with meals.  10/11/19  Yes Nicole Kindred A, DO  ipratropium (ATROVENT HFA) 17 MCG/ACT inhaler Inhale 2 puffs into the lungs every 6 (six) hours as needed for wheezing.   Yes [provider]  latanoprost (XALATAN) 0.005 % ophthalmic solution Place 1 drop into both eyes at bedtime.   Yes [provider]  LINZESS 72 MCG capsule Take 72 mcg by mouth daily before breakfast.  11/21/17  Yes [provider]  metoprolol tartrate (LOPRESSOR) 25 MG tablet Take 0.5 tablets (12.5 mg total) by mouth 2 (two) times daily. Patient taking differently: Take 25 mg by mouth 2 (two) times daily.  10/11/19  Yes Nicole Kindred A, DO  mometasone-formoterol (DULERA) 100-5 MCG/ACT AERO Inhale 2 puffs into the lungs every 4 (four) hours as needed for wheezing.   Yes [provider]  nitroGLYCERIN (NITROSTAT) 0.4 MG SL tablet Place 1 tablet (0.4 mg total) under the tongue every 5 (five) minutes as needed for chest pain. 04/30/19  Yes [name], MD  pantoprazole (PROTONIX) 40 MG tablet Take 1 tablet (40 mg total) by mouth daily. 08/26/19  Yes Jeanmarie Hubert, MD  polyethylene glycol (MIRALAX / GLYCOLAX) 17 g packet Take 17 g by mouth daily as needed  for mild constipation.    Yes [provider]  pravastatin (PRAVACHOL) 40 MG tablet Take 40 mg by mouth daily. 10/28/19  Yes [provider]  PROAIR HFA 108 (90 Base) MCG/ACT inhaler Inhale 2 puffs into the lungs every 6 (six) hours as needed for wheezing.  11/17/17  Yes [provider]  VELPHORO 500 MG chewable tablet Chew 500 mg by mouth 3 (three) times daily with meals. 11/22/19  Yes [provider]  Grant Ruts INHUB 250-50 MCG/DOSE AEPB Inhale 2 puffs into the lungs daily.  05/04/19  Yes [provider]    Family History Family History  Problem Relation Age of Onset  . Diabetes Mother   . Hypertension Mother   . Cancer Mother   . Hyperlipidemia Father   . Hypertension Father   . Kidney disease Father   . Gout Father   . Cerebrovascular Accident Father   . Other Other        no known family CAD    Social History Social History   Tobacco Use  . Smoking status: Current Some Day Smoker    Packs/day: 0.25    Years: 50.00    Pack years: 12.50    Types: Cigarettes  . Smokeless tobacco: Never Used  Substance Use Topics  . Alcohol use: No    Alcohol/week: 0.0 standard drinks  . Drug use: Yes    Types: Cocaine    Comment: pt denies at current time     Allergies   Naproxen   Review of Systems Review of Systems  Constitutional: Negative for chills and fever.  HENT: Negative for congestion and rhinorrhea.   Eyes: Negative for redness and visual disturbance.  Respiratory: Negative for shortness of breath and wheezing.   Cardiovascular: Negative for chest pain and palpitations.  Gastrointestinal: Negative for nausea and vomiting.  Genitourinary: Negative for dysuria and urgency.  Musculoskeletal: Positive for arthralgias and myalgias. Negative for back pain.       Gait disturbance   Skin: Negative for pallor and wound.  Neurological: Negative for dizziness and headaches.     Physical Exam Updated Vital Signs BP (!) 148/62 (BP  Location: Right Arm)   Pulse 78   Temp 98.7 F (37.1 C) (Oral)   Resp 16   SpO2 100%   Physical Exam Vitals signs and nursing note reviewed.  Constitutional:      General: She is not in acute distress.    Appearance: She is well-developed. She is not diaphoretic.  HENT:     Head: Normocephalic and atraumatic.  Eyes:     Pupils: Pupils are equal, round, and reactive to light.  Neck:     Musculoskeletal: Normal range of motion and neck supple.  Cardiovascular:     Rate and Rhythm: Normal rate and regular rhythm.     Heart sounds: No murmur. No friction rub. No gallop.   Pulmonary:     Effort: Pulmonary effort is normal.     Breath sounds: No wheezing or rales.  Abdominal:     General: There is no distension.     Palpations: Abdomen is soft.     Tenderness: There is no abdominal tenderness.  Musculoskeletal:        General: No tenderness.     Right lower leg: Edema present.     Left lower leg: Edema present.     Comments: 1-2+ edema to bilateral lower extremities.  Feet are warm and appear to be well-perfused.  Motor and sensation are intact to bilateral lower extremities.  She has dopplerable pulses bilaterally to the posterior tibialis.  Signal seems more intense on the right than the left.  She has 4-5 muscle strength bilaterally worse on the left than the right.  Reflexes are 2+ and equal.  Skin:  General: Skin is warm and dry.  Neurological:     Mental Status: She is alert and oriented to person, place, and time.  Psychiatric:        Behavior: Behavior normal.      ED Treatments / Results  Labs (all labs ordered are listed, but only abnormal results are displayed) Labs Reviewed  CBC WITH DIFFERENTIAL/PLATELET - Abnormal; Notable for the following components:      Result Value   RBC 3.67 (*)    Hemoglobin 10.3 (*)    HCT 33.7 (*)    RDW 18.1 (*)    All other components within normal limits  BASIC METABOLIC PANEL - Abnormal; Notable for the following  components:   Potassium 5.9 (*)    Chloride 97 (*)    Glucose, Bld 181 (*)    BUN 95 (*)    Creatinine, Ser 9.39 (*)    Calcium 8.2 (*)    GFR calc non Af Amer 4 (*)    GFR calc Af Amer 4 (*)    Anion gap 17 (*)    All other components within normal limits  COMPREHENSIVE METABOLIC PANEL - Abnormal; Notable for the following components:   Glucose, Bld 115 (*)    BUN 99 (*)    Creatinine, Ser 10.03 (*)    Calcium 7.9 (*)    Total Protein 6.1 (*)    Albumin 2.9 (*)    GFR calc non Af Amer 4 (*)    GFR calc Af Amer 4 (*)    Anion gap 18 (*)    All other components within normal limits  CBC - Abnormal; Notable for the following components:   RBC 3.56 (*)    Hemoglobin 10.0 (*)    HCT 33.3 (*)    RDW 18.1 (*)    All other components within normal limits  BASIC METABOLIC PANEL - Abnormal; Notable for the following components:   Glucose, Bld 151 (*)    BUN 99 (*)    Creatinine, Ser 9.83 (*)    Calcium 8.2 (*)    GFR calc non Af Amer 4 (*)    GFR calc Af Amer 4 (*)    Anion gap 19 (*)    All other components within normal limits  CBG MONITORING, ED - Abnormal; Notable for the following components:   Glucose-Capillary 196 (*)    All other components within normal limits  CBG MONITORING, ED - Abnormal; Notable for the following components:   Glucose-Capillary 101 (*)    All other components within normal limits  CBG MONITORING, ED - Abnormal; Notable for the following components:   Glucose-Capillary 203 (*)    All other components within normal limits  SARS CORONAVIRUS 2 (TAT 6-24 HRS)  CREATININE, SERUM  POC SARS CORONAVIRUS 2 AG -  ED    EKG EKG Interpretation  Date/Time:  Wednesday December 07 2019 10:48:58 EST Ventricular Rate:  69 PR Interval:  210 QRS Duration: 96 QT Interval:  438 QTC Calculation: 469 R Axis:   -5 Text Interpretation: Sinus rhythm with 1st degree A-V block Minimal voltage criteria for LVH, may be normal variant ( Cornell product ) T wave  abnormality, consider lateral ischemia Abnormal ECG slight peaking to t waves Otherwise no significant change Confirmed by Deno Etienne [phone]) on 12/07/2019 11:05:34 AM   Radiology No results found.  Procedures Procedures (including critical care time)  Medications Ordered in ED Medications  heparin injection 5,000 Units (5,000 Units Subcutaneous Given 12/08/19  0316)  acetaminophen (TYLENOL) tablet 650 mg (has no administration in time range)    Or  acetaminophen (TYLENOL) suppository 650 mg (has no administration in time range)  insulin aspart (novoLOG) injection 0-6 Units (0 Units Subcutaneous Not Given 12/08/19 1252)  insulin aspart (novoLOG) injection 0-5 Units (0 Units Subcutaneous Not Given 12/08/19 0316)  sodium polystyrene (KAYEXALATE) 15 GM/60ML suspension (has no administration in time range)  Chlorhexidine Gluconate Cloth 2 % PADS 6 each (has no administration in time range)  acetaminophen (TYLENOL) tablet 1,000 mg (1,000 mg Oral Given 12/07/19 1150)  oxyCODONE (Oxy IR/ROXICODONE) immediate release tablet 2.5 mg (2.5 mg Oral Given 12/07/19 1149)  calcium gluconate 1 g/ 50 mL sodium chloride IVPB (0 g Intravenous Stopped 12/07/19 2324)  sodium bicarbonate injection 50 mEq (50 mEq Intravenous Given 12/07/19 2158)  sodium polystyrene (KAYEXALATE) 15 GM/60ML suspension 30 g (30 g Oral Given 12/08/19 0316)     Initial Impression / Assessment and Plan / ED Course  I have reviewed the triage vital signs and the nursing notes.  Pertinent labs & imaging results that were available during my care of the patient were reviewed by me and considered in my medical decision making (see chart for details).  Clinical Course as of Dec 07 1353  Wed Dec 07, 2019  1540 Pt signed out to me by Dr. Tyrone Nine.  Briefly 68 yo female presenting with acute on chronic weakness in her legs, with symptoms ongoing for 10 years, but feeling her symptoms are worsening lately.  She did ambulate into the ED today.   Uses cane at baseline.  Missed dialysis yesterday and pending labs here, also CM helping with PT evaluation in the ED.   [MT]  1630 Will discuss hyperK with nephrology.  PT has seen patient, awaiting their recommendations.  No ECG changes concerning for HyperK at this time   [MT]  1814 Paged nephrology again, haven't heard back after consult ordered   [MT]    Clinical Course User Index [MT] Trifan, Carola Rhine, MD       68 yo F with a chief complaint of leg weakness.  This is a chronic issue for her but worsening over the past few days.  Gotten to the point where she feels like she can no longer walk.  As the patient missed dialysis yesterday will obtain lab work to evaluate for need for urgent dialysis.  We will treat the patient's pain and then reattempt to ambulate.  Consult to social work for possible home health and physical therapy.  Signed out to Dr. Langston Masker, please see their note for further details of care in the ED.   The patients results and plan were reviewed and discussed.   Any x-rays performed were independently reviewed by myself.   Differential diagnosis were considered with the presenting HPI.  Medications  heparin injection 5,000 Units (5,000 Units Subcutaneous Given 12/08/19 0316)  acetaminophen (TYLENOL) tablet 650 mg (has no administration in time range)    Or  acetaminophen (TYLENOL) suppository 650 mg (has no administration in time range)  insulin aspart (novoLOG) injection 0-6 Units (0 Units Subcutaneous Not Given 12/08/19 1252)  insulin aspart (novoLOG) injection 0-5 Units (0 Units Subcutaneous Not Given 12/08/19 0316)  sodium polystyrene (KAYEXALATE) 15 GM/60ML suspension (has no administration in time range)  Chlorhexidine Gluconate Cloth 2 % PADS 6 each (has no administration in time range)  acetaminophen (TYLENOL) tablet 1,000 mg (1,000 mg Oral Given 12/07/19 1150)  oxyCODONE (Oxy IR/ROXICODONE) immediate release tablet  2.5 mg (2.5 mg Oral Given 12/07/19  1149)  calcium gluconate 1 g/ 50 mL sodium chloride IVPB (0 g Intravenous Stopped 12/07/19 2324)  sodium bicarbonate injection 50 mEq (50 mEq Intravenous Given 12/07/19 2158)  sodium polystyrene (KAYEXALATE) 15 GM/60ML suspension 30 g (30 g Oral Given 12/08/19 0316)    Vitals:   12/07/19 1056 12/07/19 1629 12/08/19 0713 12/08/19 1154  BP: (!) 110/47 (!) 107/48 (!) 135/57 (!) 148/62  Pulse: 68 68 72 78  Resp: 16 16 18 16   Temp: 98.7 F (37.1 C)     TempSrc: Oral     SpO2: 100% 100% 98% 100%    Final diagnoses:  Leg weakness, bilateral       Final Clinical Impressions(s) / ED Diagnoses   Final diagnoses:  Leg weakness, bilateral    ED Discharge Orders    None       Deno Etienne, DO 12/07/19 [place], [place], DO 12/08/19 1354

## 2019-12-07 NOTE — TOC Initial Note (Signed)
Transition of Care [facility]) - Initial/Assessment Note    Patient Details  Name: [name] [name] MRN: [id number] Date of Birth: June 03, 1951  Transition of Care [facility]) CM/SW Contact:    Vergie Living, LCSW Phone Number: 12/07/2019, 3:27 PM  Clinical Narrative:     CSW assessed PT at bedside. PT stated that she had had 5 falls since September.  PT stated that her husband was nevous to have her at home and that she wished to go to SNF.  PT reported that she previously had [facility] in home.  Physical Therapy is evaluating currently.              Expected Discharge Plan: Skilled Nursing Facility Barriers to Discharge: Continued Medical Work up   Patient Goals and CMS Choice Patient states their goals for this hospitalization and ongoing recovery are:: PT wishes to go to SNF      Expected Discharge Plan and Services Expected Discharge Plan: [place]       Living arrangements for the past 2 months: Single Family Home                                      Prior Living Arrangements/Services Living arrangements for the past 2 months: Single Family Home Lives with:: Self, Spouse                Criminal Activity/Legal Involvement Pertinent to Current Situation/Hospitalization: No - Comment as needed  Activities of Daily Living      Permission Sought/Granted                  Emotional Assessment Appearance:: Appears stated age, Developmentally appropriate Attitude/Demeanor/Rapport: Engaged, Gracious Affect (typically observed): Pleasant, Hopeful Orientation: : Oriented to Self, Oriented to Place, Oriented to  Time, Oriented to Situation      Admission diagnosis:  fall Patient Active Problem List   Diagnosis Date Noted  . Recurrent falls 10/09/2019  . Generalized weakness 10/09/2019  . Unspecified atrial fibrillation ([place]) 10/08/2019  . Acute GI bleeding 10/05/2019  . Hyperkalemia   . Depression   . Esophageal stricture    . Diabetic gastroparesis ([place])   . Upper GI bleed 08/16/2019  . Hiatal hernia   . Iron deficiency anemia   . Microcytic anemia 02/05/2016  . Acute on chronic renal failure ([place])   . CAD in native artery   . Substance abuse ([place])   . Chronic combined systolic and diastolic CHF (congestive heart failure) ([place])   . ESRD (end stage renal disease) ([place])   . Obesity, Class III, BMI 40-49.9 (morbid obesity) ([place])   . Type 2 diabetes mellitus with chronic kidney disease on chronic dialysis, with long-term current use of insulin ([place]) 10/18/2014  . S/P CABG (coronary artery bypass graft) 09/21/2013  . Arthritis of right shoulder region 03/23/2013  . Tobacco abuse   . Coronary artery disease   . NSTEMI (non-ST elevated myocardial infarction) ([place]) 06/29/2012  . History of cocaine abuse ([place]) 06/29/2012  . Essential hypertension 06/29/2012  . Glaucoma   . Hyperlipidemia   . History of CVA (cerebrovascular accident)    PCP:  [facility] Pharmacy:   [facility] [address], [address] [place] [address] Phone: [phone] Fax: [place], [place]  [place] [address] Phone: [phone] Fax: [phone]  [facility] [place], [place] - [address] [place] [address] Phone: [phone] Fax: [phone]     Social Determinants of Health (SDOH) Interventions    Readmission Risk Interventions Readmission Risk Prevention Plan 10/08/2019 08/26/2019 06/03/2019  Transportation Screening - Complete -  Medication Review (RN Care Manager) - Complete -  PCP or Specialist appointment within 3-5 days of discharge - Complete Complete  HRI or Home Care Consult - Complete -  SW Recovery Care/Counseling Consult - Complete -  Palliative Care Screening Not Applicable Not Applicable -  [place] - Not Applicable -  Some recent data might be hidden

## 2019-12-07 NOTE — Evaluation (Addendum)
Physical Therapy Evaluation Patient Details Name: [name] [name] MRN: [id number] DOB: 06-24-1951 Today's Date: 12/07/2019   History of Present Illness  Patient is a 68 y/o female admitted with LE weakness and increasing falls at home.  PMH to include DM, CAD s/p CABG, CKD, HF, CVA, ESRD on HD  Clinical Impression  Patient presents with decreased LE strength, decreased balance and limited activity tolerance this session limited to about 30'.  Reports multiple falls at home with inability to rise without professional help.  Feel she will benefit from skilled PT in the acute setting and follow up SNF level rehab at d/c.  Reports already in the works for her to get rollator walker for home.      Follow Up Recommendations SNF;Supervision/Assistance - 24 hour    Equipment Recommendations  Rolling walker with 5" wheels    Recommendations for Other Services       Precautions / Restrictions Precautions Precautions: Fall Precaution Comments: multiple falls at home, reports about 8 in last 2-3 weeks      Mobility  Bed Mobility Overal bed mobility: Needs Assistance Bed Mobility: Supine to Sit;Sit to Supine     Supine to sit: Min assist;HOB elevated Sit to supine: Mod assist   General bed mobility comments: pt needed to pull up to come up to sit, to supine with assist for LE's  Transfers Overall transfer level: Needs assistance Equipment used: Rolling walker (2 wheeled) Transfers: Sit to/from Stand Sit to Stand: Min guard         General transfer comment: for balance with RW; cues for hand placement to sit and for positioning, stood second time to step toward [facility] with RW  Ambulation/Gait Ambulation/Gait assistance: Min assist Gait Distance (Feet): 30 Feet Assistive device: Rolling walker (2 wheeled) Gait Pattern/deviations: Step-to pattern;Step-through pattern;Decreased stride length;Shuffle;Trunk flexed     General Gait Details: slow and with increasing difficulty with  distance, requested to sit due to "legs locking up" but turned around and walked back to stretcher with A for safety  Stairs            Wheelchair Mobility    Modified Rankin (Stroke Patients Only)       Balance Overall balance assessment: Needs assistance Sitting-balance support: Feet unsupported Sitting balance-Leahy Scale: Fair Sitting balance - Comments: at edge of stretcher no UE support   Standing balance support: Bilateral upper extremity supported Standing balance-Leahy Scale: Poor Standing balance comment: UE support for balance, due to LE's feeling stiff                             Pertinent Vitals/Pain Pain Assessment: Faces Faces Pain Scale: Hurts little more Pain Location: L leg/hip Pain Descriptors / Indicators: Aching;Grimacing;Guarding Pain Intervention(s): Monitored during session;Repositioned    Home Living Family/patient expects to be discharged to:: Skilled nursing facility Living Arrangements: Spouse/significant other               Additional Comments: 14 steps to enter home    Prior Function Level of Independence: Needs assistance   Gait / Transfers Assistance Needed: amb with cane, recent frequent falls; reports using her "walker" which is quad cane but legs lock up and she falls           Hand Dominance        Extremity/Trunk Assessment   Upper Extremity Assessment Upper Extremity Assessment: Overall WFL for tasks assessed    Lower Extremity Assessment Lower Extremity  Assessment: Generalized weakness(pain L LE)       Communication   Communication: No difficulties  Cognition Arousal/Alertness: Awake/alert Behavior During Therapy: WFL for tasks assessed/performed Overall Cognitive Status: Within Functional Limits for tasks assessed                                        General Comments General comments (skin integrity, edema, etc.): patient requesting to go to rehab due to spouse doeen't  want her to come home and keep falling    Exercises     Assessment/Plan    PT Assessment Patient needs continued PT services  PT Problem List Decreased strength;Decreased activity tolerance;Decreased balance;Decreased mobility;Decreased knowledge of precautions;Decreased safety awareness       PT Treatment Interventions DME instruction;Stair training;Therapeutic activities;Balance training;Gait training;Functional mobility training;Therapeutic exercise;Neuromuscular re-education;Patient/family education    PT Goals (Current goals can be found in the Care Plan section)  Acute Rehab PT Goals Patient Stated Goal: to go to rehab prior to home PT Goal Formulation: With patient Time For Goal Achievement: 12/21/19 Potential to Achieve Goals: Good    Frequency Min 3X/week   Barriers to discharge        Co-evaluation               AM-PAC PT "6 Clicks" Mobility  Outcome Measure Help needed turning from your back to your side while in a flat bed without using bedrails?: A Little Help needed moving from lying on your back to sitting on the side of a flat bed without using bedrails?: A Little Help needed moving to and from a bed to a chair (including a wheelchair)?: A Little Help needed standing up from a chair using your arms (e.g., wheelchair or bedside chair)?: A Little Help needed to walk in hospital room?: A Little Help needed climbing 3-5 steps with a railing? : A Little 6 Click Score: 18    End of Session   Activity Tolerance: Patient tolerated treatment well Patient left: in bed;with call bell/phone within reach Nurse Communication: Mobility status PT Visit Diagnosis: Other abnormalities of gait and mobility (R26.89);Difficulty in walking, not elsewhere classified (R26.2)    Time: 4196-2229 PT Time Calculation (min) (ACUTE ONLY): 37 min   Charges:   PT Evaluation $PT Eval Moderate Complexity: 1 Mod PT Treatments $Gait Training: 8-22 mins        [name] [name],  [facility] [phone] 12/07/2019   Reginia Naas 12/07/2019, 4:39 PM

## 2019-12-07 NOTE — ED Notes (Signed)
Attempted to obtain labs, unable to at this time, phlebotomy notified.

## 2019-12-07 NOTE — ED Notes (Signed)
Patient ambulated in hallway with one person assist and use of cane.

## 2019-12-07 NOTE — ED Notes (Signed)
ED Provider at bedside. 

## 2019-12-07 NOTE — Progress Notes (Addendum)
PT recommended SNF rehab. Husband is requesting SNF. CSW following for SNF placement. Referral sent to [facility] for [facility], waiting [facility] Medicare auth for SNF. Jonnie Finner RN CCM, [facility] ED TOC CM [phone]  Alvis Lemmings accepted referral for [facility] if dc to home. Will need HHRN, PT, aide and SW orders with [facility]. [place], [place] ED TOC CM [phone]

## 2019-12-07 NOTE — ED Triage Notes (Signed)
Pt arrives via [facility] from home for c/o of a mechanical fall in the bathroom this am due to bilateral leg weakness that patient has had since September. Pt has no c/o of pain from fall. Pt missed dialysis yesterday, denies cp or sob.

## 2019-12-07 NOTE — H&P (Signed)
TRH H&P    Patient Demographics:    [name] [name], is a 68 y.o. female  MRN: [id number]  DOB - 07/07/1951  Admit Date - 12/07/2019  Referring MD/NP/PA:  Langston Masker  Outpatient Primary MD for the patient is [facility]  Patient coming from: home  Chief complaint- hyperkalemia, missed dialysis   HPI:    [name] [name]  is a 68 y.o. female, hypertension, hyperlipidemia, Dm2, CAD,DHIF (EF 45-50%)  Chronic Anemia ESRD on HD, TTS, presents for gait imbalance and missing dialysis.  Pt noted to have K=5.9 and ED requesting admission .   In ED,  T 98.7, P 68 R 16 Bp 110/47 l  Na 139, K 5.9 Bun 95, Creatinine 9.39 Glucose 181 Wbc 6.7, Hgb 10.3, Plt 308  EKG nsr at 70, nl axis, nl int, no st-t changes c/w ischemia  Pt will be admitted for hyperkalemia, and gait instability   Review of systems:    In addition to the HPI above,  No Fever-chills, No Headache, No changes with Vision or hearing, No problems swallowing food or Liquids, No Chest pain, Cough or Shortness of Breath, No Abdominal pain, No Nausea or Vomiting, bowel movements are regular, No Blood in stool or Urine, No dysuria, No new skin rashes or bruises, No new joints pains-aches,  No new weakness, tingling, numbness in any extremity, No recent weight gain or loss, No polyuria, polydypsia or polyphagia, No significant Mental Stressors.  All other systems reviewed and are negative.    Past History of the following :    Past Medical History:  Diagnosis Date  . Abscess   . Acute blood loss anemia 08/17/2019  . Acute respiratory failure ([place]) 10/18/2014  . Acute respiratory failure with hypoxia and hypercapnia ([place]) 06/01/2019  . Anemia 08/2016  . Angiodysplasia of colon   . Arthritis of left shoulder region 03/23/2013  . Bleeding gastrointestinal   . Cardiomegaly 05/2019  . Chest pain 04/17/2016  . Chronic  combined systolic and diastolic CHF (congestive heart failure) (HCC)    a. EF 40-45%, mild LVH, mid apicalanteroseptal and apical HK.  . CKD (chronic kidney disease), stage III   . Cocaine abuse ([place])    crack cocaine heavily until 2008 then sporadic use since then  . Coronary artery disease    a. 06/2012 NSTEMI/CABG x 3 (LIMA->LAD, VG->OM2, VG->LCX);  b. 04/2015 MV: EF<30%, mid ant, apicalanterior, apical infarct;  c. 04/2015 Cath: LM nl, LAD 90p, LCX 48m, OM1 min irregs, RCA mild dzs, LIMA->LAD nl w/ dist LAD dzs, VG->OM2 nl, VG->LCX nl-->Med Rx.  . CVA (cerebral infarction)    a. right internal capsule stroke in 12/2006  . Demand ischemia ([place])   . Diabetes mellitus    diagnosed in 2008  . Elevated troponin 04/27/2019  . Essential hypertension   . Glaucoma   . Gout   . Heme positive stool   . HFrEF (heart failure with reduced ejection fraction) ([place])   . Hyperlipidemia   . Hyperparathyroidism, secondary renal ([place])   .  Hypertensive crisis 06/02/2019  . Left-sided sensory deficit present   . Lobar pneumonia ([place]) 04/27/2019  . Obesity, morbid ([place])   . Pneumonia   . Pulmonary edema 05/2019  . PVD (peripheral vascular disease) ([place])    a. 06/2012 ABI's: R - 0.73, L - 0.71.  Marland Kitchen Renal mass, right   . Sepsis ([place]) 04/27/2019  . Shortness of breath dyspnea   . Stroke ([place])   . Thrombocytosis ([place]) 04/17/2016  . Thyroid nodule    FNA in 4742 showed follicular cells but not definate neoplasm  . Tobacco abuse   . Trichomoniasis       Past Surgical History:  Procedure Laterality Date  . AV FISTULA PLACEMENT Left 08/25/2019   Procedure: ARTERIOVENOUS (AV) BRACHIOCEPHALIC FISTULA CREATION;  Surgeon: Waynetta Sandy, MD;  Location: [facility];  Service: Vascular;  Laterality: Left;  . CARDIAC CATHETERIZATION    . CARDIAC CATHETERIZATION N/A 05/17/2015   Procedure: Left Heart Cath and Cors/Grafts Angiography;  Surgeon: [name], MD;  Location: [facility] CV LAB;  Service:  Cardiovascular;  Laterality: N/A;  . COLONOSCOPY WITH PROPOFOL N/A 04/21/2016   Procedure: COLONOSCOPY WITH PROPOFOL;  Surgeon: Irene Shipper, MD;  Location: [facility];  Service: Endoscopy;  Laterality: N/A;  . CORONARY ARTERY BYPASS GRAFT  07/09/2012   Procedure: CORONARY ARTERY BYPASS GRAFTING (CABG);  Surgeon: Ivin Poot, MD;  Location: [facility];  Service: Open Heart Surgery;  Laterality: N/A;  . ENTEROSCOPY N/A 10/07/2019   Procedure: ENTEROSCOPY;  Surgeon: Jackquline Denmark, MD;  Location[facility] ENDOSCOPY;  Service: Endoscopy;  Laterality: N/A;  . ESOPHAGOGASTRODUODENOSCOPY N/A 04/20/2016   Procedure: ESOPHAGOGASTRODUODENOSCOPY (EGD);  Surgeon: Gatha Mayer, MD;  Location[facility] ENDOSCOPY;  Service: Endoscopy;  Laterality: N/A;  . ESOPHAGOGASTRODUODENOSCOPY (EGD) WITH PROPOFOL N/A 08/17/2019   Procedure: ESOPHAGOGASTRODUODENOSCOPY (EGD) WITH PROPOFOL;  Surgeon: Irene Shipper, MD;  Location: [facility] ENDOSCOPY;  Service: Endoscopy;  Laterality: N/A;  . INSERTION OF DIALYSIS CATHETER Right 08/25/2019   Procedure: INSERTION OF DIALYSIS CATHETER;  Surgeon: Waynetta Sandy, MD;  Location: [facility];  Service: Vascular;  Laterality: Right;  . IR FLUORO GUIDE CV LINE RIGHT  08/19/2019  . IR US GUIDE VASC ACCESS RIGHT  08/19/2019  . LEFT HEART CATHETERIZATION WITH CORONARY ANGIOGRAM N/A 06/29/2012   Procedure: LEFT HEART CATHETERIZATION WITH CORONARY ANGIOGRAM;  Surgeon: Peter M Martinique, MD;  Location: [facility] CATH LAB;  Service: Cardiovascular;  Laterality: N/A;  . STERNAL WOUND DEBRIDEMENT  08/17/2012   Procedure: STERNAL WOUND DEBRIDEMENT;  Surgeon: Ivin Poot, MD;  Location: [facility] OR;  Service: Thoracic;  Laterality: N/A;  wound vac application  . STERNAL WOUND DEBRIDEMENT  08/24/2012   Procedure: STERNAL WOUND DEBRIDEMENT;  Surgeon: Ivin Poot, MD;  Location: [facility];  Service: Thoracic;  Laterality: N/A;  . STERNAL WOUND DEBRIDEMENT  09/01/2012   Procedure: STERNAL WOUND DEBRIDEMENT;  Surgeon: Ivin Poot,  MD;  Location: [facility];  Service: Thoracic;  Laterality: N/A;  . STERNAL WOUND DEBRIDEMENT  09/20/2012   Procedure: STERNAL WOUND DEBRIDEMENT;  Surgeon: Ivin Poot, MD;  Location: [facility] OR;  Service: Thoracic;  Laterality: N/A;  wound vac change  . SUBMUCOSAL TATTOO INJECTION  10/07/2019   Procedure: SUBMUCOSAL TATTOO INJECTION;  Surgeon: Jackquline Denmark, MD;  Location: [facility] ENDOSCOPY;  Service: Endoscopy;;      Social History:      Social History   Tobacco Use  . Smoking status: Current Some Day Smoker    Packs/day: 0.25  Years: 50.00    Pack years: 12.50    Types: Cigarettes  . Smokeless tobacco: Never Used  Substance Use Topics  . Alcohol use: No    Alcohol/week: 0.0 standard drinks       Family History :     Family History  Problem Relation Age of Onset  . Diabetes Mother   . Hypertension Mother   . Cancer Mother   . Hyperlipidemia Father   . Hypertension Father   . Kidney disease Father   . Gout Father   . Cerebrovascular Accident Father   . Other Other        no known family CAD       Home Medications:   Prior to Admission medications   Medication Sig Start Date End Date Taking? Authorizing Provider  allopurinol (ZYLOPRIM) 100 MG tablet Take 100 mg by mouth 2 (two) times daily.  11/25/17   [provider]  brinzolamide (AZOPT) 1 % ophthalmic suspension Place 1 drop into both eyes 2 (two) times a day.     [provider]  buPROPion (WELLBUTRIN SR) 150 MG 12 hr tablet Take 1 tablet (150 mg total) by mouth 2 (two) times daily. 01/16/16   Mikhail, Velta Addison, DO  cetirizine (ZYRTEC) 10 MG tablet Take 10 mg by mouth daily.     [provider]  ferrous sulfate 325 (65 FE) MG tablet Take 325 mg by mouth 2 (two) times daily with a meal.     [provider]  gabapentin (NEURONTIN) 300 MG capsule Take 1 capsule (300 mg total) by mouth daily. 08/26/19 10/05/19  Jeanmarie Hubert, MD  insulin aspart (NOVOLOG) 100 UNIT/ML injection Inject 0-15  Units into the skin 3 (three) times daily with meals. 10/11/19   Nicole Kindred A, DO  ipratropium (ATROVENT HFA) 17 MCG/ACT inhaler Inhale 2 puffs into the lungs every 6 (six) hours as needed for wheezing.    [provider]  latanoprost (XALATAN) 0.005 % ophthalmic solution Place 1 drop into both eyes at bedtime.    [provider]  LINZESS 72 MCG capsule Take 72 mcg by mouth daily before breakfast.  11/21/17   [provider]  metoprolol tartrate (LOPRESSOR) 25 MG tablet Take 0.5 tablets (12.5 mg total) by mouth 2 (two) times daily. 10/11/19   Ezekiel Slocumb, DO  mometasone-formoterol (DULERA) 100-5 MCG/ACT AERO Inhale 2 puffs into the lungs every 4 (four) hours as needed for wheezing.    [provider]  nitroGLYCERIN (NITROSTAT) 0.4 MG SL tablet Place 1 tablet (0.4 mg total) under the tongue every 5 (five) minutes as needed for chest pain. 04/30/19   [name], MD  pantoprazole (PROTONIX) 40 MG tablet Take 1 tablet (40 mg total) by mouth daily. 08/26/19   Jeanmarie Hubert, MD  polyethylene glycol (MIRALAX / GLYCOLAX) 17 g packet Take 17 g by mouth daily as needed for mild constipation.     [provider]  PROAIR HFA 108 [phone] Base) MCG/ACT inhaler Inhale 2 puffs into the lungs every 6 (six) hours as needed for wheezing.  11/17/17   [provider]  Grant Ruts INHUB 250-50 MCG/DOSE AEPB Inhale 2 puffs into the lungs daily.  05/04/19   [provider]     Allergies:     Allergies  Allergen Reactions  . Naproxen Rash     Physical Exam:   Vitals  Blood pressure (!) 107/48, pulse 68, temperature 98.7 F (37.1 C), temperature source Oral, resp. rate 16, SpO2 100 %.  1.  General: axoxo3  2. Psychiatric: euthymic  3. Neurologic: cn2-12 intact, reflexes 2+ symmetric, diffuse with no clonus, motor 5/5 in all 4 ext  4. HEENMT:  Anicteric, pupils 1.54mm symmetric, direct, consensual intact Neck: no jvd  5. Respiratory : CTAB on  anterior ausculation  6. Cardiovascular : rrr s1, s2, no m/g/r  7. Gastrointestinal:  Abd: soft, nt, nd, +bs  8. Skin:  Ext: no c/c/e,  No rash  9.Musculoskeletal:  Good ROM    Data Review:    CBC Recent Labs  Lab 12/07/19 1543  WBC 6.7  HGB 10.3*  HCT 33.7*  PLT 308  MCV 91.8  MCH 28.1  MCHC 30.6  RDW 18.1*  LYMPHSABS 1.0  MONOABS 0.5  EOSABS 0.1  BASOSABS 0.0   ------------------------------------------------------------------------------------------------------------------  Results for orders placed or performed during the hospital encounter of 12/07/19 (from the past 48 hour(s))  CBC with Differential     Status: Abnormal   Collection Time: 12/07/19  3:43 PM  Result Value Ref Range   WBC 6.7 4.0 - 10.5 K/uL   RBC 3.67 (L) 3.87 - 5.11 MIL/uL   Hemoglobin 10.3 (L) 12.0 - 15.0 g/dL   HCT 33.7 (L) 36.0 - 46.0 %   MCV 91.8 80.0 - 100.0 fL   MCH 28.1 26.0 - 34.0 pg   MCHC 30.6 30.0 - 36.0 g/dL   RDW 18.1 (H) 11.5 - 15.5 %   Platelets 308 150 - 400 K/uL   nRBC 0.0 0.0 - 0.2 %   Neutrophils Relative % 76 %   Neutro Abs 5.0 1.7 - 7.7 K/uL   Lymphocytes Relative 15 %   Lymphs Abs 1.0 0.7 - 4.0 K/uL   Monocytes Relative 8 %   Monocytes Absolute 0.5 0.1 - 1.0 K/uL   Eosinophils Relative 1 %   Eosinophils Absolute 0.1 0.0 - 0.5 K/uL   Basophils Relative 0 %   Basophils Absolute 0.0 0.0 - 0.1 K/uL   Immature Granulocytes 0 %   Abs Immature Granulocytes 0.03 0.00 - 0.07 K/uL    Comment: Performed at [facility], [address], [place], [place] [address]  Basic metabolic panel     Status: Abnormal   Collection Time: 12/07/19  3:43 PM  Result Value Ref Range   Sodium 139 135 - 145 mmol/L   Potassium 5.9 (H) 3.5 - 5.1 mmol/L   Chloride 97 (L) 98 - 111 mmol/L   CO2 25 22 - 32 mmol/L   Glucose, Bld 181 (H) 70 - 99 mg/dL   BUN 95 (H) 8 - 23 mg/dL   Creatinine, Ser 9.39 (H) 0.44 - 1.00 mg/dL   Calcium 8.2 (L) 8.9 - 10.3 mg/dL   GFR calc non Af Amer 4  (L) >60 mL/min   GFR calc Af Amer 4 (L) >60 mL/min   Anion gap 17 (H) 5 - 15    Comment: Performed at [facility] [address] [place], [place] [address]    Chemistries  Recent Labs  Lab 12/07/19 1543  NA 139  K 5.9*  CL 97*  CO2 25  GLUCOSE 181*  BUN 95*  CREATININE 9.39*  CALCIUM 8.2*   ------------------------------------------------------------------------------------------------------------------  ------------------------------------------------------------------------------------------------------------------ GFR: CrCl cannot be calculated (Unknown ideal weight.). Liver Function Tests: No results for input(s): AST, ALT, ALKPHOS, BILITOT, PROT, ALBUMIN in the last 168 hours. No results for input(s): LIPASE, AMYLASE in the last 168 hours. No results for input(s): AMMONIA in the last 168 hours.  Coagulation Profile: No results for input(s): INR, PROTIME in the last 168 hours. Cardiac Enzymes: No results for input(s): CKTOTAL, CKMB, CKMBINDEX, TROPONINI in the last 168 hours. BNP (last 3 results) No results for input(s): PROBNP in the last 8760 hours. HbA1C: No results for input(s): HGBA1C in the last 72 hours. CBG: No results for input(s): GLUCAP in the last 168 hours. Lipid Profile: No results for input(s): CHOL, HDL, LDLCALC, TRIG, CHOLHDL, LDLDIRECT in the last 72 hours. Thyroid Function Tests: No results for input(s): TSH, T4TOTAL, FREET4, T3FREE, THYROIDAB in the last 72 hours. Anemia Panel: No results for input(s): VITAMINB12, FOLATE, FERRITIN, TIBC, IRON, RETICCTPCT in the last 72 hours.  --------------------------------------------------------------------------------------------------------------- Urine analysis:    Component Value Date/Time   COLORURINE YELLOW 08/19/2019 0712   APPEARANCEUR HAZY (A) 08/19/2019 0712   LABSPEC 1.012 08/19/2019 0712   PHURINE 5.0 08/19/2019 0712   GLUCOSEU NEGATIVE 08/19/2019 0712   HGBUR NEGATIVE 08/19/2019  0712   BILIRUBINUR NEGATIVE 08/19/2019 0712   KETONESUR NEGATIVE 08/19/2019 0712   PROTEINUR NEGATIVE 08/19/2019 0712   UROBILINOGEN 0.2 05/12/2015 0938   NITRITE NEGATIVE 08/19/2019 0712   LEUKOCYTESUR SMALL (A) 08/19/2019 0712      Imaging Results:    No results found.     Assessment & Plan:    Principal Problem:   Hyperkalemia Active Problems:   Hyperlipidemia   History of CVA (cerebrovascular accident)   History of cocaine abuse ([place])   Essential hypertension   Coronary artery disease   Type 2 diabetes mellitus with chronic kidney disease on chronic dialysis, with long-term current use of insulin (HCC)   ESRD (end stage renal disease) (HCC)  Hyperkalemia Calcium gluconate 1gm iv x1 Sodium bicarbonate 1 amp iv x1 Kayexalate 30gm po x1 Nephrology consulted by ED per ED to please arrange dialysis Please call nephrology in am to verify that ED did indeed reach nephrology, Thanks  Gait instability PT to evaluate and tx  ESRD on HD TTS Cont Velphoro  H/o CVA Cont Aspirin  Cont Pravastatin 40mg  po qhs  Anemia Cont Ferrous sulfate  Hypertension Cont Metoprolol 12.5mg  po bid  Dm2  Cont Novolog Fsbs ac and qhs, ISS  Diabetic neuropathy STOP Gabapentin Need to decrease dose since renally cleared.  Constipation Cont Linzess  Cont Miralax  Gerd Cont PPI  Glaucoma Cont Azopt Cont Xalatan  Asthma Cont Dulera Cont ProAir  Anxiety Cont WEllbutrin SR 150mg  po bid   DVT Prophylaxis-   Heparin, SCDs   AM Labs Ordered, also please review Full Orders  Family Communication: Admission, patients condition and plan of care including tests being ordered have been discussed with the patient  who indicate understanding and agree with the plan and Code Status.  Code Status:  FULL CODE per patient  Admission status: Observation : Based on patients clinical presentation and evaluation of above clinical data, I have made determination that patient meets  observation criteria at this time.     Time spent in minutes : 70 minutes   Jani Gravel M.D on 12/07/2019 at 7:34 PM

## 2019-12-08 DIAGNOSIS — E875 Hyperkalemia: Principal | ICD-10-CM

## 2019-12-08 LAB — BASIC METABOLIC PANEL
Anion gap: 19 — ABNORMAL HIGH (ref 5–15)
BUN: 99 mg/dL — ABNORMAL HIGH (ref 8–23)
CO2: 24 mmol/L (ref 22–32)
Calcium: 8.2 mg/dL — ABNORMAL LOW (ref 8.9–10.3)
Chloride: 99 mmol/L (ref 98–111)
Creatinine, Ser: 9.83 mg/dL — ABNORMAL HIGH (ref 0.44–1.00)
GFR calc Af Amer: 4 mL/min — ABNORMAL LOW (ref >60)
GFR calc non Af Amer: 4 mL/min — ABNORMAL LOW (ref >60)
Glucose, Bld: 151 mg/dL — ABNORMAL HIGH (ref 70–99)
Potassium: 4.9 mmol/L (ref 3.5–5.1)
Sodium: 142 mmol/L (ref 135–145)

## 2019-12-08 LAB — COMPREHENSIVE METABOLIC PANEL
ALT: 11 U/L (ref 0–44)
AST: 16 U/L (ref 15–41)
Albumin: 2.9 g/dL — ABNORMAL LOW (ref 3.5–5.0)
Alkaline Phosphatase: 111 U/L (ref 38–126)
Anion gap: 18 — ABNORMAL HIGH (ref 5–15)
BUN: 99 mg/dL — ABNORMAL HIGH (ref 8–23)
CO2: 24 mmol/L (ref 22–32)
Calcium: 7.9 mg/dL — ABNORMAL LOW (ref 8.9–10.3)
Chloride: 103 mmol/L (ref 98–111)
Creatinine, Ser: 10.03 mg/dL — ABNORMAL HIGH (ref 0.44–1.00)
GFR calc Af Amer: 4 mL/min — ABNORMAL LOW (ref >60)
GFR calc non Af Amer: 4 mL/min — ABNORMAL LOW (ref >60)
Glucose, Bld: 115 mg/dL — ABNORMAL HIGH (ref 70–99)
Potassium: 4.9 mmol/L (ref 3.5–5.1)
Sodium: 145 mmol/L (ref 135–145)
Total Bilirubin: 0.4 mg/dL (ref 0.3–1.2)
Total Protein: 6.1 g/dL — ABNORMAL LOW (ref 6.5–8.1)

## 2019-12-08 LAB — CBC
HCT: 33.3 % — ABNORMAL LOW (ref 36.0–46.0)
Hemoglobin: 10 g/dL — ABNORMAL LOW (ref 12.0–15.0)
MCH: 28.1 pg (ref 26.0–34.0)
MCHC: 30 g/dL (ref 30.0–36.0)
MCV: 93.5 fL (ref 80.0–100.0)
Platelets: 325 10*3/uL (ref 150–400)
RBC: 3.56 MIL/uL — ABNORMAL LOW (ref 3.87–5.11)
RDW: 18.1 % — ABNORMAL HIGH (ref 11.5–15.5)
WBC: 6.2 10*3/uL (ref 4.0–10.5)
nRBC: 0 % (ref 0.0–0.2)

## 2019-12-08 LAB — CBG MONITORING, ED
Glucose-Capillary: 196 mg/dL — ABNORMAL HIGH (ref 70–99)
Glucose-Capillary: 101 mg/dL — ABNORMAL HIGH (ref 70–99)
Glucose-Capillary: 203 mg/dL — ABNORMAL HIGH (ref 70–99)

## 2019-12-08 LAB — GLUCOSE, CAPILLARY: Glucose-Capillary: 155 mg/dL — ABNORMAL HIGH (ref 70–99)

## 2019-12-08 LAB — SARS CORONAVIRUS 2 (TAT 6-24 HRS): SARS Coronavirus 2: NEGATIVE

## 2019-12-08 MED ORDER — SODIUM POLYSTYRENE SULFONATE 15 GM/60ML PO SUSP
ORAL | Status: AC
  Filled 2019-12-08: qty 60

## 2019-12-08 MED ORDER — METOPROLOL TARTRATE 25 MG PO TABS
25.0000 mg | ORAL_TABLET | Freq: Two times a day (BID) | ORAL | Status: DC
  Administered 2019-12-08 – 2019-12-11 (×7): 25 mg via ORAL
  Filled 2019-12-08 (×7): qty 1

## 2019-12-08 MED ORDER — POLYETHYLENE GLYCOL 3350 17 G PO PACK: 17.0000 g | PACK | Freq: Every day | ORAL | Status: DC | PRN

## 2019-12-08 MED ORDER — ALLOPURINOL 100 MG PO TABS
100.0000 mg | ORAL_TABLET | Freq: Two times a day (BID) | ORAL | Status: DC
  Administered 2019-12-08 – 2019-12-12 (×8): 100 mg via ORAL
  Filled 2019-12-08 (×8): qty 1

## 2019-12-08 MED ORDER — ALBUTEROL SULFATE (2.5 MG/3ML) 0.083% IN NEBU: 3.0000 mL | INHALATION_SOLUTION | Freq: Four times a day (QID) | RESPIRATORY_TRACT | Status: DC | PRN

## 2019-12-08 MED ORDER — HEPARIN SODIUM (PORCINE) 1000 UNIT/ML IJ SOLN
INTRAMUSCULAR | Status: AC
  Filled 2019-12-08: qty 4

## 2019-12-08 MED ORDER — BRINZOLAMIDE 1 % OP SUSP
1.0000 [drp] | Freq: Two times a day (BID) | OPHTHALMIC | Status: DC
  Administered 2019-12-08 – 2019-12-12 (×8): 1 [drp] via OPHTHALMIC
  Filled 2019-12-08: qty 10

## 2019-12-08 MED ORDER — PANTOPRAZOLE SODIUM 40 MG PO TBEC
40.0000 mg | DELAYED_RELEASE_TABLET | Freq: Every day | ORAL | Status: DC
  Administered 2019-12-08 – 2019-12-12 (×5): 40 mg via ORAL
  Filled 2019-12-08 (×5): qty 1

## 2019-12-08 MED ORDER — BUPROPION HCL ER (SR) 150 MG PO TB12
150.0000 mg | ORAL_TABLET | Freq: Two times a day (BID) | ORAL | Status: DC
  Administered 2019-12-08 – 2019-12-12 (×8): 150 mg via ORAL
  Filled 2019-12-08 (×8): qty 1

## 2019-12-08 MED ORDER — LORATADINE 10 MG PO TABS
10.0000 mg | ORAL_TABLET | Freq: Every day | ORAL | Status: DC
  Administered 2019-12-08 – 2019-12-11 (×4): 10 mg via ORAL
  Filled 2019-12-08 (×5): qty 1

## 2019-12-08 MED ORDER — LATANOPROST 0.005 % OP SOLN
1.0000 [drp] | Freq: Every day | OPHTHALMIC | Status: DC
  Administered 2019-12-08 – 2019-12-11 (×4): 1 [drp] via OPHTHALMIC
  Filled 2019-12-08: qty 2.5

## 2019-12-08 MED ORDER — LINACLOTIDE 72 MCG PO CAPS
72.0000 ug | ORAL_CAPSULE | Freq: Every day | ORAL | Status: DC
  Administered 2019-12-09 – 2019-12-12 (×4): 72 ug via ORAL
  Filled 2019-12-08 (×4): qty 1

## 2019-12-08 MED ORDER — ASPIRIN EC 325 MG PO TBEC
325.0000 mg | DELAYED_RELEASE_TABLET | Freq: Every day | ORAL | Status: DC
  Administered 2019-12-08 – 2019-12-12 (×5): 325 mg via ORAL
  Filled 2019-12-08 (×5): qty 1

## 2019-12-08 MED ORDER — IPRATROPIUM BROMIDE 0.02 % IN SOLN: 2.5000 mL | Freq: Four times a day (QID) | RESPIRATORY_TRACT | Status: DC | PRN

## 2019-12-08 MED ORDER — FUROSEMIDE 40 MG PO TABS
40.0000 mg | ORAL_TABLET | Freq: Two times a day (BID) | ORAL | Status: DC
  Administered 2019-12-08 – 2019-12-12 (×8): 40 mg via ORAL
  Filled 2019-12-08 (×8): qty 1

## 2019-12-08 MED ORDER — PRAVASTATIN SODIUM 40 MG PO TABS
40.0000 mg | ORAL_TABLET | Freq: Every day | ORAL | Status: DC
  Administered 2019-12-08 – 2019-12-12 (×5): 40 mg via ORAL
  Filled 2019-12-08 (×5): qty 1

## 2019-12-08 MED ORDER — CHLORHEXIDINE GLUCONATE CLOTH 2 % EX PADS
6.0000 | MEDICATED_PAD | Freq: Every day | CUTANEOUS | Status: DC
  Administered 2019-12-09 – 2019-12-12 (×4): 6 via TOPICAL

## 2019-12-08 MED ORDER — FERROUS SULFATE 325 (65 FE) MG PO TABS
325.0000 mg | ORAL_TABLET | Freq: Two times a day (BID) | ORAL | Status: DC
  Administered 2019-12-09 (×2): 325 mg via ORAL
  Filled 2019-12-08 (×2): qty 1

## 2019-12-08 MED ORDER — MOMETASONE FURO-FORMOTEROL FUM 100-5 MCG/ACT IN AERO
2.0000 | INHALATION_SPRAY | Freq: Two times a day (BID) | RESPIRATORY_TRACT | Status: DC
  Administered 2019-12-08 – 2019-12-12 (×7): 2 via RESPIRATORY_TRACT
  Filled 2019-12-08: qty 8.8

## 2019-12-08 MED ORDER — SUCROFERRIC OXYHYDROXIDE 500 MG PO CHEW
500.0000 mg | CHEWABLE_TABLET | Freq: Three times a day (TID) | ORAL | Status: DC
  Administered 2019-12-09 – 2019-12-12 (×8): 500 mg via ORAL
  Filled 2019-12-08 (×12): qty 1

## 2019-12-08 NOTE — Progress Notes (Signed)
[facility] PROGRESS NOTE  JERA HEADINGS ZOX:[id number] DOB: August 15, 1951 DOA: 12/07/2019 PCP: [facility]  Assessment/Plan: Hyperkalemia. Resolved this am.  Related to missing dialysis. Received Calcium gluconate 1gm iv x1, Sodium bicarbonate 1 amp iv x1, Kayexalate 30gm po x1. Nephrology consulted and dialysis scheduled for today. Hopefully can discharge after dialysis  Gait instability. Physical therapy evaluated last evening and recommended snf. Chart indicates husband wants snf as he is unable to care for her. Today patient ambulated to BR with minimal assist per ED RN. Have requested SW to circle back with patient and family regarding dispo.   ESRD on HD TTS evaluated by nephrology who recommend dialysis today Cont Velphoro  H/o CVA stable at baseline Cont Aspirin  Cont Pravastatin 40mg  po qhs  Anemia likely related to chronic disease. Hg 10. No s/sx active bleeding Cont Ferrous sulfate  Hypertension controlled. Home meds include lasix, metoprolol,  Cont Metoprolol 12.5mg  po bid Continue lasix  Dm2 controlled. HgA1c 5.2 3 months ago Cont Novolog Fsbs ac and qhs, ISS  Diabetic neuropathy STOP Gabapentin Need to decrease dose since renally cleared.  Constipation Cont Linzess  Cont Miralax  Gerd Cont PPI  Glaucoma Cont Azopt Cont Xalatan  Asthma Cont Dulera Cont ProAir  Anxiety Cont WEllbutrin SR 150mg  po bid   Code Status: full Family Communication: patient Disposition Plan: to be determined   Consultants:  [facility]  Procedures:  dialysis  Antibiotics: HPI/Subjective: Awake alert. Denies pain/discomfort. Reports feeling "better:  Objective: Vitals:   12/07/19 1629 12/08/19 0713  BP: (!) 107/48 (!) 135/57  Pulse: 68 72  Resp: 16 18  Temp:    SpO2: 100% 98%    Intake/Output Summary (Last 24 hours) at 12/08/2019 1113 Last data filed at 12/07/2019 2324 Gross per 24 hour  Intake 50 ml   Output -  Net 50 ml   There were no vitals filed for this visit.  Exam:   General:  Awake alert obese no acute distress  Cardiovascular: rrr no mgr no LE edema  Respiratory: normal effort BS clear bilaterally no wheeze  Abdomen: obese soft +BS no guarding or rebounding  Musculoskeletal: joints without swelling/erythema   Data Reviewed: Basic Metabolic Panel: Recent Labs  Lab 12/07/19 0317 12/07/19 1543 12/08/19 0500  NA 142 139 145  K 4.9 5.9* 4.9  CL 99 97* 103  CO2 24 25 24   GLUCOSE 151* 181* 115*  BUN 99* 95* 99*  CREATININE 9.83* 9.39* 10.03*  CALCIUM 8.2* 8.2* 7.9*   Liver Function Tests: Recent Labs  Lab 12/08/19 0500  AST 16  ALT 11  ALKPHOS 111  BILITOT 0.4  PROT 6.1*  ALBUMIN 2.9*   No results for input(s): LIPASE, AMYLASE in the last 168 hours. No results for input(s): AMMONIA in the last 168 hours. CBC: Recent Labs  Lab 12/07/19 1543 12/08/19 0500  WBC 6.7 6.2  NEUTROABS 5.0  --   HGB 10.3* 10.0*  HCT 33.7* 33.3*  MCV 91.8 93.5  PLT 308 325   Cardiac Enzymes: No results for input(s): CKTOTAL, CKMB, CKMBINDEX, TROPONINI in the last 168 hours. BNP (last 3 results) Recent Labs    04/27/19 1942 06/01/19 2322 08/16/19 0934  BNP 619.8* 331.8* 250.5*    ProBNP (last 3 results) No results for input(s): PROBNP in the last 8760 hours.  CBG: Recent Labs  Lab 12/08/19 0148  GLUCAP 196*    No results found for this or any previous visit (from the past 240  hour(s)).   Studies: No results found.  Scheduled Meds: . Chlorhexidine Gluconate Cloth  6 each Topical Q0600  . heparin  5,000 Units Subcutaneous Q8H  . insulin aspart  0-5 Units Subcutaneous QHS  . insulin aspart  0-6 Units Subcutaneous TID WC  . sodium polystyrene       Continuous Infusions:  Principal Problem:   Hyperkalemia Active Problems:   Essential hypertension   ESRD (end stage renal disease) (HCC)   History of CVA (cerebrovascular accident)   History of  cocaine abuse ([place])   Coronary artery disease   Type 2 diabetes mellitus with chronic kidney disease on chronic dialysis, with long-term current use of insulin ([place])   Hyperlipidemia    Time spent: 69 minutes  [place] NP [facility]  If 7PM-7AM, please contact night-coverage at [email or web address], password [facility] 12/08/2019, 11:13 AM  LOS: 0 days

## 2019-12-08 NOTE — ED Notes (Signed)
CSW at bed side ambulating pt

## 2019-12-08 NOTE — NC FL2 (Signed)
[place] LEVEL OF CARE SCREENING TOOL     IDENTIFICATION  Patient Name: [name] [name] Birthdate: 05/04/1951 Sex: female Admission Date (Current Location): 12/07/2019  [place] and [place] Number:  Kathleen Argue [phone] [place] and Address:  The . [facility], [place] [address] [place], [place] [address]      Provider Number: [id number]  Attending Physician Name and Address:  Domenic Polite, MD  Relative Name and Phone Number:       Current Level of Care: Hospital Recommended Level of Care: [place] Prior Approval Number:    Date Approved/Denied:   PASRR Number: [id number]  Discharge Plan: SNF    Current Diagnoses: Patient Active Problem List   Diagnosis Date Noted  . Recurrent falls 10/09/2019  . Generalized weakness 10/09/2019  . Unspecified atrial fibrillation ([place]) 10/08/2019  . Acute GI bleeding 10/05/2019  . Hyperkalemia   . Depression   . Esophageal stricture   . Diabetic gastroparesis ([place])   . Upper GI bleed 08/16/2019  . Hiatal hernia   . Iron deficiency anemia   . Microcytic anemia 02/05/2016  . Acute on chronic renal failure ([place])   . CAD in native artery   . Substance abuse ([place])   . Chronic combined systolic and diastolic CHF (congestive heart failure) ([place])   . ESRD (end stage renal disease) ([place])   . Obesity, Class III, BMI 40-49.9 (morbid obesity) ([place])   . Type 2 diabetes mellitus with chronic kidney disease on chronic dialysis, with long-term current use of insulin ([place]) 10/18/2014  . S/P CABG (coronary artery bypass graft) 09/21/2013  . Arthritis of right shoulder region 03/23/2013  . Tobacco abuse   . Coronary artery disease   . NSTEMI (non-ST elevated myocardial infarction) ([place]) 06/29/2012  . History of cocaine abuse ([place]) 06/29/2012  . Essential hypertension 06/29/2012  . Glaucoma   . Hyperlipidemia   . History of CVA (cerebrovascular accident)     Orientation RESPIRATION BLADDER  Height & Weight     Self, Time, Situation, Place  Normal Continent Weight:   Height:     BEHAVIORAL SYMPTOMS/MOOD NEUROLOGICAL BOWEL NUTRITION STATUS      Continent Diet  AMBULATORY STATUS COMMUNICATION OF NEEDS Skin   Extensive Assist Verbally Normal                       Personal Care Assistance Level of Assistance  Bathing, Dressing, Feeding Bathing Assistance: Maximum assistance Feeding assistance: Independent Dressing Assistance: Maximum assistance     Functional Limitations Info  Sight, Hearing, Speech Sight Info: Adequate(Wears corrective lens (glasses)) Hearing Info: Adequate Speech Info: Adequate    SPECIAL CARE FACTORS FREQUENCY  PT (By licensed PT), OT (By licensed OT)     PT Frequency: 5x weekly OT Frequency: 5x weekly            Contractures Contractures Info: Not present    Additional Factors Info  Allergies, Code Status, Insulin Sliding Scale Code Status Info: Full Allergies Info: Naproxen   Insulin Sliding Scale Info: 0-15 units       Current Medications (12/08/2019):  This is the current hospital active medication list Current Facility-Administered Medications  Medication Dose Route Frequency Provider Last Rate Last Admin  . acetaminophen (TYLENOL) tablet 650 mg  650 mg Oral Q6H PRN Jani Gravel, MD       Or  . acetaminophen (TYLENOL) suppository 650 mg  650 mg Rectal Q6H PRN Jani Gravel, MD      .  Chlorhexidine Gluconate Cloth 2 % PADS 6 each  6 each Topical Q0600 Loren Racer, PA-C      . heparin injection 5,000 Units  5,000 Units Subcutaneous Q8H Jani Gravel, MD   5,000 Units at 12/08/19 0316  . insulin aspart (novoLOG) injection 0-5 Units  0-5 Units Subcutaneous QHS Jani Gravel, MD      . insulin aspart (novoLOG) injection 0-6 Units  0-6 Units Subcutaneous TID WC Jani Gravel, MD      . sodium polystyrene (KAYEXALATE) 15 GM/60ML suspension            Current Outpatient Medications  Medication Sig Dispense Refill  . allopurinol  (ZYLOPRIM) 100 MG tablet Take 100 mg by mouth 2 (two) times daily.     Marland Kitchen aspirin EC 325 MG tablet Take 325 mg by mouth daily.    . brinzolamide (AZOPT) 1 % ophthalmic suspension Place 1 drop into both eyes 2 (two) times a day.     Marland Kitchen buPROPion (WELLBUTRIN SR) 150 MG 12 hr tablet Take 1 tablet (150 mg total) by mouth 2 (two) times daily. 60 tablet 0  . cetirizine (ZYRTEC) 10 MG tablet Take 10 mg by mouth daily as needed for allergies.     . ferrous sulfate 325 (65 FE) MG tablet Take 325 mg by mouth 2 (two) times daily with a meal.     . furosemide (LASIX) 40 MG tablet Take 40 mg by mouth 2 (two) times daily.    Marland Kitchen gabapentin (NEURONTIN) 300 MG capsule Take 1 capsule (300 mg total) by mouth daily. (Patient taking differently: Take 300 mg by mouth 2 (two) times daily. ) 30 capsule 0  . insulin aspart (NOVOLOG) 100 UNIT/ML injection Inject 0-15 Units into the skin 3 (three) times daily with meals. (Patient taking differently: Inject 2 Units into the skin 3 (three) times daily with meals. ) 10 mL 11  . ipratropium (ATROVENT HFA) 17 MCG/ACT inhaler Inhale 2 puffs into the lungs every 6 (six) hours as needed for wheezing.    . latanoprost (XALATAN) 0.005 % ophthalmic solution Place 1 drop into both eyes at bedtime.    Marland Kitchen LINZESS 72 MCG capsule Take 72 mcg by mouth daily before breakfast.   3  . metoprolol tartrate (LOPRESSOR) 25 MG tablet Take 0.5 tablets (12.5 mg total) by mouth 2 (two) times daily. (Patient taking differently: Take 25 mg by mouth 2 (two) times daily. ) 60 tablet 1  . mometasone-formoterol (DULERA) 100-5 MCG/ACT AERO Inhale 2 puffs into the lungs every 4 (four) hours as needed for wheezing.    . nitroGLYCERIN (NITROSTAT) 0.4 MG SL tablet Place 1 tablet (0.4 mg total) under the tongue every 5 (five) minutes as needed for chest pain. 30 tablet 0  . pantoprazole (PROTONIX) 40 MG tablet Take 1 tablet (40 mg total) by mouth daily. 30 tablet 0  . polyethylene glycol (MIRALAX / GLYCOLAX) 17 g packet  Take 17 g by mouth daily as needed for mild constipation.     . pravastatin (PRAVACHOL) 40 MG tablet Take 40 mg by mouth daily.    Marland Kitchen PROAIR HFA 108 (90 Base) MCG/ACT inhaler Inhale 2 puffs into the lungs every 6 (six) hours as needed for wheezing.   3  . VELPHORO 500 MG chewable tablet Chew 500 mg by mouth 3 (three) times daily with meals.    Grant Ruts INHUB 250-50 MCG/DOSE AEPB Inhale 2 puffs into the lungs daily.        Discharge Medications:  Please see discharge summary for a list of discharge medications.  Relevant Imaging Results:  Relevant Lab Results:   Additional Information SSN [id number] [facility] on a TTS schedule with a 7 am seat time.  Tahliyah Anagnos, LCSW

## 2019-12-08 NOTE — Consult Note (Addendum)
[facility] Renal Consultation Note    Indication for Consultation:  Management of ESRD/hemodialysis, anemia, hypertension/volume, and secondary hyperparathyroidism. PCP:  HPI: [name] [name] is a 68 y.o. female with ESRD, CAD (Hx CABG), combined HF (EF 40-45%), T2DM, PVD who was admitted OBS status with hyperkalemia and weakness.   Presented to ED with leg weakness which had been ongoing x 1 week and worsening, unable to stand. No CP, dyspnea, fever, chills, cough, abd pain, nausea or vomiting. Initial labs showed K 5.9, Ca 8.2, WBC 6.7, Hgb 10.3. Rapid COVID negative. She was treated medically for hyperkalemia with CaGlu, D50/insulin, bicarb, and kayexelate.   Today, still feels weak - has not been out of bed. K improved. She is overdue for dialysis - she missed her last treatment, last full HD on 12/5.   Past Medical History:  Diagnosis Date  . Abscess   . Acute blood loss anemia 08/17/2019  . Acute respiratory failure ([place]) 10/18/2014  . Acute respiratory failure with hypoxia and hypercapnia ([place]) 06/01/2019  . Anemia 08/2016  . Angiodysplasia of colon   . Arthritis of left shoulder region 03/23/2013  . Bleeding gastrointestinal   . Cardiomegaly 05/2019  . Chest pain 04/17/2016  . Chronic combined systolic and diastolic CHF (congestive heart failure) (HCC)    a. EF 40-45%, mild LVH, mid apicalanteroseptal and apical HK.  . CKD (chronic kidney disease), stage III   . Cocaine abuse ([place])    crack cocaine heavily until 2008 then sporadic use since then  . Coronary artery disease    a. 06/2012 NSTEMI/CABG x 3 (LIMA->LAD, VG->OM2, VG->LCX);  b. 04/2015 MV: EF<30%, mid ant, apicalanterior, apical infarct;  c. 04/2015 Cath: LM nl, LAD 90p, LCX 33m, OM1 min irregs, RCA mild dzs, LIMA->LAD nl w/ dist LAD dzs, VG->OM2 nl, VG->LCX nl-->Med Rx.  . CVA (cerebral infarction)    a. right internal capsule stroke in 12/2006  . Demand ischemia ([place])   . Diabetes mellitus    diagnosed in  2008  . Elevated troponin 04/27/2019  . Essential hypertension   . Glaucoma   . Gout   . Heme positive stool   . HFrEF (heart failure with reduced ejection fraction) ([place])   . Hyperlipidemia   . Hyperparathyroidism, secondary renal ([place])   . Hypertensive crisis 06/02/2019  . Left-sided sensory deficit present   . Lobar pneumonia ([place]) 04/27/2019  . Obesity, morbid ([place])   . Pneumonia   . Pulmonary edema 05/2019  . PVD (peripheral vascular disease) ([place])    a. 06/2012 ABI's: R - 0.73, L - 0.71.  Marland Kitchen Renal mass, right   . Sepsis ([place]) 04/27/2019  . Shortness of breath dyspnea   . Stroke ([place])   . Thrombocytosis ([place]) 04/17/2016  . Thyroid nodule    FNA in 7858 showed follicular cells but not definate neoplasm  . Tobacco abuse   . Trichomoniasis    Past Surgical History:  Procedure Laterality Date  . AV FISTULA PLACEMENT Left 08/25/2019   Procedure: ARTERIOVENOUS (AV) BRACHIOCEPHALIC FISTULA CREATION;  Surgeon: Waynetta Sandy, MD;  Location: [place];  Service: Vascular;  Laterality: Left;  . CARDIAC CATHETERIZATION    . CARDIAC CATHETERIZATION N/A 05/17/2015   Procedure: Left Heart Cath and Cors/Grafts Angiography;  Surgeon: [name], MD;  Location: [facility] CV LAB;  Service: Cardiovascular;  Laterality: N/A;  . COLONOSCOPY WITH PROPOFOL N/A 04/21/2016   Procedure: COLONOSCOPY WITH PROPOFOL;  Surgeon: Irene Shipper, MD;  Location: [place];  Service: Endoscopy;  Laterality: N/A;  . CORONARY ARTERY BYPASS GRAFT  07/09/2012   Procedure: CORONARY ARTERY BYPASS GRAFTING (CABG);  Surgeon: Ivin Poot, MD;  Location: [place];  Service: Open Heart Surgery;  Laterality: N/A;  . ENTEROSCOPY N/A 10/07/2019   Procedure: ENTEROSCOPY;  Surgeon: Jackquline Denmark, MD;  Location: [facility] ENDOSCOPY;  Service: Endoscopy;  Laterality: N/A;  . ESOPHAGOGASTRODUODENOSCOPY N/A 04/20/2016   Procedure: ESOPHAGOGASTRODUODENOSCOPY (EGD);  Surgeon: Gatha Mayer, MD;  Location: [facility] ENDOSCOPY;  Service:  Endoscopy;  Laterality: N/A;  . ESOPHAGOGASTRODUODENOSCOPY (EGD) WITH PROPOFOL N/A 08/17/2019   Procedure: ESOPHAGOGASTRODUODENOSCOPY (EGD) WITH PROPOFOL;  Surgeon: Irene Shipper, MD;  Location: [facility] ENDOSCOPY;  Service: Endoscopy;  Laterality: N/A;  . INSERTION OF DIALYSIS CATHETER Right 08/25/2019   Procedure: INSERTION OF DIALYSIS CATHETER;  Surgeon: Waynetta Sandy, MD;  Location: [place];  Service: Vascular;  Laterality: Right;  . IR FLUORO GUIDE CV LINE RIGHT  08/19/2019  . IR US GUIDE VASC ACCESS RIGHT  08/19/2019  . LEFT HEART CATHETERIZATION WITH CORONARY ANGIOGRAM N/A 06/29/2012   Procedure: LEFT HEART CATHETERIZATION WITH CORONARY ANGIOGRAM;  Surgeon: Peter M Martinique, MD;  Location: [facility] CATH LAB;  Service: Cardiovascular;  Laterality: N/A;  . STERNAL WOUND DEBRIDEMENT  08/17/2012   Procedure: STERNAL WOUND DEBRIDEMENT;  Surgeon: Ivin Poot, MD;  Location: [facility] OR;  Service: Thoracic;  Laterality: N/A;  wound vac application  . STERNAL WOUND DEBRIDEMENT  08/24/2012   Procedure: STERNAL WOUND DEBRIDEMENT;  Surgeon: Ivin Poot, MD;  Location: [place];  Service: Thoracic;  Laterality: N/A;  . STERNAL WOUND DEBRIDEMENT  09/01/2012   Procedure: STERNAL WOUND DEBRIDEMENT;  Surgeon: Ivin Poot, MD;  Location: [place];  Service: Thoracic;  Laterality: N/A;  . STERNAL WOUND DEBRIDEMENT  09/20/2012   Procedure: STERNAL WOUND DEBRIDEMENT;  Surgeon: Ivin Poot, MD;  Location: [facility] OR;  Service: Thoracic;  Laterality: N/A;  wound vac change  . SUBMUCOSAL TATTOO INJECTION  10/07/2019   Procedure: SUBMUCOSAL TATTOO INJECTION;  Surgeon: Jackquline Denmark, MD;  Location: [facility] ENDOSCOPY;  Service: Endoscopy;;   Family History  Problem Relation Age of Onset  . Diabetes Mother   . Hypertension Mother   . Cancer Mother   . Hyperlipidemia Father   . Hypertension Father   . Kidney disease Father   . Gout Father   . Cerebrovascular Accident Father   . Other Other        no known family CAD   Social  History:  reports that she has been smoking cigarettes. She has a 12.50 pack-year smoking history. She has never used smokeless tobacco. She reports current drug use. Drug: Cocaine. She reports that she does not drink alcohol.  ROS: As per HPI otherwise negative.  Physical Exam: Vitals:   12/07/19 1056 12/07/19 1629 12/08/19 0713 12/08/19 1154  BP: (!) 110/47 (!) 107/48 (!) 135/57 (!) 148/62  Pulse: 68 68 72 78  Resp: 16 16 18 16   Temp: 98.7 F (37.1 C)     TempSrc: Oral     SpO2: 100% 100% 98% 100%     General: Overweight woman, NAD Head: Normocephalic, atraumatic, sclera non-icteric, mucus membranes are moist. Neck: Supple without lymphadenopathy/masses. Lungs: Clear bilaterally to auscultation without wheezes, rales, or rhonchi.  Heart: RRR with normal S1, S2. No murmurs, rubs, or gallops appreciated. Abdomen: Soft, non-tender, non-distended with normoactive bowel sounds. No rebound/guarding.  Musculoskeletal:  Strength and tone appear normal for age. Lower extremities: 1+ pitting edema in B  legs; scrape/scabbing to anterior L shin Neuro: Alert and oriented X 3. Moves all extremities spontaneously. Psych:  Responds to questions appropriately with a normal affect. Dialysis Access: TDC in R chest; no tenderness  Allergies  Allergen Reactions  . Naproxen Rash   Prior to Admission medications   Medication Sig Start Date End Date Taking? Authorizing Provider  allopurinol (ZYLOPRIM) 100 MG tablet Take 100 mg by mouth 2 (two) times daily.  11/25/17  Yes [provider]  aspirin EC 325 MG tablet Take 325 mg by mouth daily. 10/28/19  Yes [provider]  brinzolamide (AZOPT) 1 % ophthalmic suspension Place 1 drop into both eyes 2 (two) times a day.    Yes [provider]  buPROPion (WELLBUTRIN SR) 150 MG 12 hr tablet Take 1 tablet (150 mg total) by mouth 2 (two) times daily. 01/16/16  Yes Mikhail, Velta Addison, DO  cetirizine (ZYRTEC) 10 MG tablet Take 10 mg by  mouth daily as needed for allergies.    Yes [provider]  ferrous sulfate 325 (65 FE) MG tablet Take 325 mg by mouth 2 (two) times daily with a meal.    Yes [provider]  furosemide (LASIX) 40 MG tablet Take 40 mg by mouth 2 (two) times daily. 10/28/19  Yes [provider]  gabapentin (NEURONTIN) 300 MG capsule Take 1 capsule (300 mg total) by mouth daily. Patient taking differently: Take 300 mg by mouth 2 (two) times daily.  08/26/19 12/07/19 Yes Jeanmarie Hubert, MD  insulin aspart (NOVOLOG) 100 UNIT/ML injection Inject 0-15 Units into the skin 3 (three) times daily with meals. Patient taking differently: Inject 2 Units into the skin 3 (three) times daily with meals.  10/11/19  Yes Nicole Kindred A, DO  ipratropium (ATROVENT HFA) 17 MCG/ACT inhaler Inhale 2 puffs into the lungs every 6 (six) hours as needed for wheezing.   Yes [provider]  latanoprost (XALATAN) 0.005 % ophthalmic solution Place 1 drop into both eyes at bedtime.   Yes [provider]  LINZESS 72 MCG capsule Take 72 mcg by mouth daily before breakfast.  11/21/17  Yes [provider]  metoprolol tartrate (LOPRESSOR) 25 MG tablet Take 0.5 tablets (12.5 mg total) by mouth 2 (two) times daily. Patient taking differently: Take 25 mg by mouth 2 (two) times daily.  10/11/19  Yes Nicole Kindred A, DO  mometasone-formoterol (DULERA) 100-5 MCG/ACT AERO Inhale 2 puffs into the lungs every 4 (four) hours as needed for wheezing.   Yes [provider]  nitroGLYCERIN (NITROSTAT) 0.4 MG SL tablet Place 1 tablet (0.4 mg total) under the tongue every 5 (five) minutes as needed for chest pain. 04/30/19  Yes [name], MD  pantoprazole (PROTONIX) 40 MG tablet Take 1 tablet (40 mg total) by mouth daily. 08/26/19  Yes Jeanmarie Hubert, MD  polyethylene glycol (MIRALAX / GLYCOLAX) 17 g packet Take 17 g by mouth daily as needed for mild constipation.    Yes [provider]   pravastatin (PRAVACHOL) 40 MG tablet Take 40 mg by mouth daily. 10/28/19  Yes [provider]  PROAIR HFA 108 (90 Base) MCG/ACT inhaler Inhale 2 puffs into the lungs every 6 (six) hours as needed for wheezing.  11/17/17  Yes [provider]  VELPHORO 500 MG chewable tablet Chew 500 mg by mouth 3 (three) times daily with meals. 11/22/19  Yes [provider]  Grant Ruts INHUB 250-50 MCG/DOSE AEPB Inhale 2 puffs into the lungs daily.  05/04/19  Yes  [provider]   Current Facility-Administered Medications  Medication Dose Route Frequency Provider Last Rate Last Admin  . acetaminophen (TYLENOL) tablet 650 mg  650 mg Oral Q6H PRN Jani Gravel, MD       Or  . acetaminophen (TYLENOL) suppository 650 mg  650 mg Rectal Q6H PRN Jani Gravel, MD      . Chlorhexidine Gluconate Cloth 2 % PADS 6 each  6 each Topical Q0600 Loren Racer, PA-C      . heparin injection 5,000 Units  5,000 Units Subcutaneous Q8H Jani Gravel, MD   5,000 Units at 12/08/19 0316  . insulin aspart (novoLOG) injection 0-5 Units  0-5 Units Subcutaneous QHS Jani Gravel, MD      . insulin aspart (novoLOG) injection 0-6 Units  0-6 Units Subcutaneous TID WC Jani Gravel, MD      . sodium polystyrene (KAYEXALATE) 15 GM/60ML suspension            Current Outpatient Medications  Medication Sig Dispense Refill  . allopurinol (ZYLOPRIM) 100 MG tablet Take 100 mg by mouth 2 (two) times daily.     Marland Kitchen aspirin EC 325 MG tablet Take 325 mg by mouth daily.    . brinzolamide (AZOPT) 1 % ophthalmic suspension Place 1 drop into both eyes 2 (two) times a day.     Marland Kitchen buPROPion (WELLBUTRIN SR) 150 MG 12 hr tablet Take 1 tablet (150 mg total) by mouth 2 (two) times daily. 60 tablet 0  . cetirizine (ZYRTEC) 10 MG tablet Take 10 mg by mouth daily as needed for allergies.     . ferrous sulfate 325 (65 FE) MG tablet Take 325 mg by mouth 2 (two) times daily with a meal.     . furosemide (LASIX) 40 MG tablet Take 40 mg by mouth 2  (two) times daily.    Marland Kitchen gabapentin (NEURONTIN) 300 MG capsule Take 1 capsule (300 mg total) by mouth daily. (Patient taking differently: Take 300 mg by mouth 2 (two) times daily. ) 30 capsule 0  . insulin aspart (NOVOLOG) 100 UNIT/ML injection Inject 0-15 Units into the skin 3 (three) times daily with meals. (Patient taking differently: Inject 2 Units into the skin 3 (three) times daily with meals. ) 10 mL 11  . ipratropium (ATROVENT HFA) 17 MCG/ACT inhaler Inhale 2 puffs into the lungs every 6 (six) hours as needed for wheezing.    . latanoprost (XALATAN) 0.005 % ophthalmic solution Place 1 drop into both eyes at bedtime.    Marland Kitchen LINZESS 72 MCG capsule Take 72 mcg by mouth daily before breakfast.   3  . metoprolol tartrate (LOPRESSOR) 25 MG tablet Take 0.5 tablets (12.5 mg total) by mouth 2 (two) times daily. (Patient taking differently: Take 25 mg by mouth 2 (two) times daily. ) 60 tablet 1  . mometasone-formoterol (DULERA) 100-5 MCG/ACT AERO Inhale 2 puffs into the lungs every 4 (four) hours as needed for wheezing.    . nitroGLYCERIN (NITROSTAT) 0.4 MG SL tablet Place 1 tablet (0.4 mg total) under the tongue every 5 (five) minutes as needed for chest pain. 30 tablet 0  . pantoprazole (PROTONIX) 40 MG tablet Take 1 tablet (40 mg total) by mouth daily. 30 tablet 0  . polyethylene glycol (MIRALAX / GLYCOLAX) 17 g packet Take 17 g by mouth daily as needed for mild constipation.     . pravastatin (PRAVACHOL) 40 MG tablet Take 40 mg by mouth daily.    Marland Kitchen PROAIR HFA 108 (90 Base)  MCG/ACT inhaler Inhale 2 puffs into the lungs every 6 (six) hours as needed for wheezing.   3  . VELPHORO 500 MG chewable tablet Chew 500 mg by mouth 3 (three) times daily with meals.    Grant Ruts INHUB 250-50 MCG/DOSE AEPB Inhale 2 puffs into the lungs daily.      Labs: Basic Metabolic Panel: Recent Labs  Lab 12/07/19 0317 12/07/19 1543 12/08/19 0500  NA 142 139 145  K 4.9 5.9* 4.9  CL 99 97* 103  CO2 24 25 24   GLUCOSE  151* 181* 115*  BUN 99* 95* 99*  CREATININE 9.83* 9.39* 10.03*  CALCIUM 8.2* 8.2* 7.9*   Liver Function Tests: Recent Labs  Lab 12/08/19 0500  AST 16  ALT 11  ALKPHOS 111  BILITOT 0.4  PROT 6.1*  ALBUMIN 2.9*   CBC: Recent Labs  Lab 12/07/19 1543 12/08/19 0500  WBC 6.7 6.2  NEUTROABS 5.0  --   HGB 10.3* 10.0*  HCT 33.7* 33.3*  MCV 91.8 93.5  PLT 308 325   CBG: Recent Labs  Lab 12/08/19 0148 12/08/19 0923 12/08/19 1150  GLUCAP 196* 101* 203*   Dialysis Orders:  TTS at [facility] 4:15hr, 400/800, EDW 119.5kg, 2K/2Ca, TDC, no heparin - Mircera 200 q 2 weeks (last 11/25) - Calcitriol 0.44mcg PO q HD  Assessment/Plan: 1.  Hyperkalemia: Improved with medication management, HD today. 2.  Weakness: Possibly related to #1, although ongoing issue. SW and PT evaluating for plan. 3.  ESRD: Usual TTS sched - missed last session - for HD today. 4.  Hypertension/volume: BP ok, some edema present. 5.  Anemia: Hgb 10 - follow. 6.  Metabolic bone disease: Corr Ca ok, Phos pending. Follow. 7.  CAD (Hx CABG) 8.  PAD  Veneta Penton, PA-C 12/08/2019, 12:19 PM  [facility] Pager: [phone]  Pt seen, examined and agree w A/P as above. Doubt general weakness is from high K+, usually needs to be higher than 6.5 or so to cause muscle weakness in ESRD pt.  [name]  MD 12/08/2019, 3:12 PM

## 2019-12-08 NOTE — Procedures (Signed)
   I was present at this dialysis session, have reviewed the session itself and made  appropriate changes [name] MD [place] pager [phone]   12/08/2019, 3:14 PM

## 2019-12-08 NOTE — Discharge Planning (Signed)
EDCM assisted pt to restroom and back to bed.

## 2019-12-08 NOTE — Progress Notes (Addendum)
CSW spoke with the business office at [facility] who stated the patient was at the facility from 10/11/2019 to 10/23/2019 so she used 13 SNF days.  Madilyn Fireman, MSW, LCSW-A Transitions of Care  Clinical Social Worker  [facility] Emergency Departments  Medical ICU [phone]

## 2019-12-08 NOTE — ED Notes (Signed)
Breakfast Ordered 

## 2019-12-08 NOTE — ED Notes (Signed)
Tele

## 2019-12-08 NOTE — NC FL2 (Signed)
[place] LEVEL OF CARE SCREENING TOOL     IDENTIFICATION  Patient Name: [name] [name] Birthdate: 07-18-1951 Sex: female Admission Date (Current Location): 12/07/2019  [employer] and [place] Number:  Kathleen Argue [phone] [place] and Address:  [facility] [place] [address] [place], [place] [address]      Provider Number: [id number]  Attending Physician Name and Address:  Domenic Polite, MD  Relative Name and Phone Number:       Current Level of Care: Hospital Recommended Level of Care: [place] Prior Approval Number:    Date Approved/Denied:   PASRR Number: [id number]  Discharge Plan: SNF    Current Diagnoses: Patient Active Problem List   Diagnosis Date Noted  . Recurrent falls 10/09/2019  . Generalized weakness 10/09/2019  . Unspecified atrial fibrillation ([place]) 10/08/2019  . Acute GI bleeding 10/05/2019  . Hyperkalemia   . Depression   . Esophageal stricture   . Diabetic gastroparesis ([place])   . Upper GI bleed 08/16/2019  . Hiatal hernia   . Iron deficiency anemia   . Microcytic anemia 02/05/2016  . Acute on chronic renal failure ([place])   . CAD in native artery   . Substance abuse ([place])   . Chronic combined systolic and diastolic CHF (congestive heart failure) ([place])   . ESRD (end stage renal disease) ([place])   . Obesity, Class III, BMI 40-49.9 (morbid obesity) ([place])   . Type 2 diabetes mellitus with chronic kidney disease on chronic dialysis, with long-term current use of insulin ([place]) 10/18/2014  . S/P CABG (coronary artery bypass graft) 09/21/2013  . Arthritis of right shoulder region 03/23/2013  . Tobacco abuse   . Coronary artery disease   . NSTEMI (non-ST elevated myocardial infarction) ([place]) 06/29/2012  . History of cocaine abuse ([place]) 06/29/2012  . Essential hypertension 06/29/2012  . Glaucoma   . Hyperlipidemia   . History of CVA (cerebrovascular accident)     Orientation RESPIRATION BLADDER  Height & Weight     Self, Time, Situation, Place  Normal Continent Weight:   Height:     BEHAVIORAL SYMPTOMS/MOOD NEUROLOGICAL BOWEL NUTRITION STATUS      Continent Diet  AMBULATORY STATUS COMMUNICATION OF NEEDS Skin   Extensive Assist Verbally Normal                       Personal Care Assistance Level of Assistance  Bathing, Dressing, Feeding Bathing Assistance: Maximum assistance Feeding assistance: Independent Dressing Assistance: Maximum assistance     Functional Limitations Info  Sight, Hearing, Speech Sight Info: Adequate(Wears corrective lens (glasses)) Hearing Info: Adequate Speech Info: Adequate    SPECIAL CARE FACTORS FREQUENCY  PT (By licensed PT), OT (By licensed OT)     PT Frequency: 5x weekly OT Frequency: 5x weekly            Contractures Contractures Info: Not present    Additional Factors Info  Allergies, Code Status, Insulin Sliding Scale Code Status Info: Full Allergies Info: Naproxen   Insulin Sliding Scale Info: 0-15 units       Current Medications (12/08/2019):  This is the current hospital active medication list Current Facility-Administered Medications  Medication Dose Route Frequency Provider Last Rate Last Admin  . acetaminophen (TYLENOL) tablet 650 mg  650 mg Oral Q6H PRN Jani Gravel, MD       Or  . acetaminophen (TYLENOL) suppository 650 mg  650 mg Rectal Q6H PRN Jani Gravel, MD      .  Chlorhexidine Gluconate Cloth 2 % PADS 6 each  6 each Topical Q0600 Loren Racer, PA-C      . heparin injection 5,000 Units  5,000 Units Subcutaneous Q8H Jani Gravel, MD   5,000 Units at 12/08/19 0316  . insulin aspart (novoLOG) injection 0-5 Units  0-5 Units Subcutaneous QHS Jani Gravel, MD      . insulin aspart (novoLOG) injection 0-6 Units  0-6 Units Subcutaneous TID WC Jani Gravel, MD      . sodium polystyrene (KAYEXALATE) 15 GM/60ML suspension            Current Outpatient Medications  Medication Sig Dispense Refill  . allopurinol  (ZYLOPRIM) 100 MG tablet Take 100 mg by mouth 2 (two) times daily.     Marland Kitchen aspirin EC 325 MG tablet Take 325 mg by mouth daily.    . brinzolamide (AZOPT) 1 % ophthalmic suspension Place 1 drop into both eyes 2 (two) times a day.     Marland Kitchen buPROPion (WELLBUTRIN SR) 150 MG 12 hr tablet Take 1 tablet (150 mg total) by mouth 2 (two) times daily. 60 tablet 0  . cetirizine (ZYRTEC) 10 MG tablet Take 10 mg by mouth daily as needed for allergies.     . ferrous sulfate 325 (65 FE) MG tablet Take 325 mg by mouth 2 (two) times daily with a meal.     . furosemide (LASIX) 40 MG tablet Take 40 mg by mouth 2 (two) times daily.    Marland Kitchen gabapentin (NEURONTIN) 300 MG capsule Take 1 capsule (300 mg total) by mouth daily. (Patient taking differently: Take 300 mg by mouth 2 (two) times daily. ) 30 capsule 0  . insulin aspart (NOVOLOG) 100 UNIT/ML injection Inject 0-15 Units into the skin 3 (three) times daily with meals. (Patient taking differently: Inject 2 Units into the skin 3 (three) times daily with meals. ) 10 mL 11  . ipratropium (ATROVENT HFA) 17 MCG/ACT inhaler Inhale 2 puffs into the lungs every 6 (six) hours as needed for wheezing.    . latanoprost (XALATAN) 0.005 % ophthalmic solution Place 1 drop into both eyes at bedtime.    Marland Kitchen LINZESS 72 MCG capsule Take 72 mcg by mouth daily before breakfast.   3  . metoprolol tartrate (LOPRESSOR) 25 MG tablet Take 0.5 tablets (12.5 mg total) by mouth 2 (two) times daily. (Patient taking differently: Take 25 mg by mouth 2 (two) times daily. ) 60 tablet 1  . mometasone-formoterol (DULERA) 100-5 MCG/ACT AERO Inhale 2 puffs into the lungs every 4 (four) hours as needed for wheezing.    . nitroGLYCERIN (NITROSTAT) 0.4 MG SL tablet Place 1 tablet (0.4 mg total) under the tongue every 5 (five) minutes as needed for chest pain. 30 tablet 0  . pantoprazole (PROTONIX) 40 MG tablet Take 1 tablet (40 mg total) by mouth daily. 30 tablet 0  . polyethylene glycol (MIRALAX / GLYCOLAX) 17 g packet  Take 17 g by mouth daily as needed for mild constipation.     . pravastatin (PRAVACHOL) 40 MG tablet Take 40 mg by mouth daily.    Marland Kitchen PROAIR HFA 108 (90 Base) MCG/ACT inhaler Inhale 2 puffs into the lungs every 6 (six) hours as needed for wheezing.   3  . VELPHORO 500 MG chewable tablet Chew 500 mg by mouth 3 (three) times daily with meals.    Grant Ruts INHUB 250-50 MCG/DOSE AEPB Inhale 2 puffs into the lungs daily.        Discharge Medications:  Please see discharge summary for a list of discharge medications.  Relevant Imaging Results:  Relevant Lab Results:   Additional Information SSN [id number] [facility] on a TTS schedule with a 7 am seat time.  Labaron Digirolamo, LCSW

## 2019-12-09 DIAGNOSIS — Z951 Presence of aortocoronary bypass graft: Secondary | ICD-10-CM | POA: Diagnosis not present

## 2019-12-09 DIAGNOSIS — I69354 Hemiplegia and hemiparesis following cerebral infarction affecting left non-dominant side: Secondary | ICD-10-CM | POA: Diagnosis not present

## 2019-12-09 DIAGNOSIS — M109 Gout, unspecified: Secondary | ICD-10-CM | POA: Diagnosis present

## 2019-12-09 DIAGNOSIS — Z8249 Family history of ischemic heart disease and other diseases of the circulatory system: Secondary | ICD-10-CM | POA: Diagnosis not present

## 2019-12-09 DIAGNOSIS — I2510 Atherosclerotic heart disease of native coronary artery without angina pectoris: Secondary | ICD-10-CM | POA: Diagnosis present

## 2019-12-09 DIAGNOSIS — Z833 Family history of diabetes mellitus: Secondary | ICD-10-CM | POA: Diagnosis not present

## 2019-12-09 DIAGNOSIS — Z6841 Body Mass Index (BMI) 40.0 and over, adult: Secondary | ICD-10-CM | POA: Diagnosis not present

## 2019-12-09 DIAGNOSIS — E1122 Type 2 diabetes mellitus with diabetic chronic kidney disease: Secondary | ICD-10-CM | POA: Diagnosis present

## 2019-12-09 DIAGNOSIS — H409 Unspecified glaucoma: Secondary | ICD-10-CM | POA: Diagnosis present

## 2019-12-09 DIAGNOSIS — I252 Old myocardial infarction: Secondary | ICD-10-CM | POA: Diagnosis not present

## 2019-12-09 DIAGNOSIS — I5042 Chronic combined systolic (congestive) and diastolic (congestive) heart failure: Secondary | ICD-10-CM | POA: Diagnosis present

## 2019-12-09 DIAGNOSIS — Z20828 Contact with and (suspected) exposure to other viral communicable diseases: Secondary | ICD-10-CM | POA: Diagnosis present

## 2019-12-09 DIAGNOSIS — Z809 Family history of malignant neoplasm, unspecified: Secondary | ICD-10-CM | POA: Diagnosis not present

## 2019-12-09 DIAGNOSIS — N2581 Secondary hyperparathyroidism of renal origin: Secondary | ICD-10-CM | POA: Diagnosis present

## 2019-12-09 DIAGNOSIS — R531 Weakness: Secondary | ICD-10-CM | POA: Diagnosis present

## 2019-12-09 DIAGNOSIS — Z7982 Long term (current) use of aspirin: Secondary | ICD-10-CM | POA: Diagnosis not present

## 2019-12-09 DIAGNOSIS — E1140 Type 2 diabetes mellitus with diabetic neuropathy, unspecified: Secondary | ICD-10-CM | POA: Diagnosis present

## 2019-12-09 DIAGNOSIS — N186 End stage renal disease: Secondary | ICD-10-CM | POA: Diagnosis present

## 2019-12-09 DIAGNOSIS — I132 Hypertensive heart and chronic kidney disease with heart failure and with stage 5 chronic kidney disease, or end stage renal disease: Secondary | ICD-10-CM | POA: Diagnosis present

## 2019-12-09 DIAGNOSIS — Z841 Family history of disorders of kidney and ureter: Secondary | ICD-10-CM | POA: Diagnosis not present

## 2019-12-09 DIAGNOSIS — Z8349 Family history of other endocrine, nutritional and metabolic diseases: Secondary | ICD-10-CM | POA: Diagnosis not present

## 2019-12-09 DIAGNOSIS — E1151 Type 2 diabetes mellitus with diabetic peripheral angiopathy without gangrene: Secondary | ICD-10-CM | POA: Diagnosis present

## 2019-12-09 DIAGNOSIS — Z794 Long term (current) use of insulin: Secondary | ICD-10-CM | POA: Diagnosis not present

## 2019-12-09 DIAGNOSIS — E875 Hyperkalemia: Secondary | ICD-10-CM | POA: Diagnosis present

## 2019-12-09 DIAGNOSIS — E785 Hyperlipidemia, unspecified: Secondary | ICD-10-CM | POA: Diagnosis present

## 2019-12-09 LAB — GLUCOSE, CAPILLARY
Glucose-Capillary: 150 mg/dL — ABNORMAL HIGH (ref 70–99)
Glucose-Capillary: 266 mg/dL — ABNORMAL HIGH (ref 70–99)

## 2019-12-09 LAB — MRSA PCR SCREENING: MRSA by PCR: NEGATIVE

## 2019-12-09 MED ORDER — RENA-VITE PO TABS
1.0000 | ORAL_TABLET | Freq: Every day | ORAL | Status: DC
  Administered 2019-12-09 – 2019-12-11 (×3): 1 via ORAL
  Filled 2019-12-09 (×3): qty 1

## 2019-12-09 MED ORDER — PRO-STAT SUGAR FREE PO LIQD
30.0000 mL | Freq: Two times a day (BID) | ORAL | Status: DC
  Administered 2019-12-09 – 2019-12-12 (×6): 30 mL via ORAL
  Filled 2019-12-09 (×6): qty 30

## 2019-12-09 NOTE — Progress Notes (Signed)
Physical Therapy Treatment Patient Details Name: [name] [name] MRN: [id number] DOB: 05-26-1951 Today's Date: 12/09/2019    History of Present Illness Patient is a 68 y/o female admitted with LE weakness and increasing falls at home.  PMH to include DM, CAD s/p CABG, CKD, HF, CVA, ESRD on HD    PT Comments    Patient received in bed, initially attempting to refuse PT stating "I'll do it tomorrow, my leg hurts and my family is coming soon"; able to convince her to participate especially when educated PT will likely not be able to see her on the weekend. Able to complete bed mobility, functional transfers, and gait approximately 25f (189f2) with RW and min guard but very unsafe and impulsive, often reaching outside of BOS despite PT cues for safety, also often pushes RW to side prematurely. VSS on room air. She was left in bed (no recliner in room) with all needs met, bed alarm active. Continues to remain appropriate for SNF moving forward.     Follow Up Recommendations  SNF;Supervision/Assistance - 24 hour     Equipment Recommendations  Rolling walker with 5" wheels    Recommendations for Other Services       Precautions / Restrictions Precautions Precautions: Fall Precaution Comments: multiple falls at home, reports about 8 in last 2-3 weeks Restrictions Weight Bearing Restrictions: No    Mobility  Bed Mobility Overal bed mobility: Needs Assistance Bed Mobility: Supine to Sit;Sit to Supine     Supine to sit: Min guard;HOB elevated Sit to supine: Min guard;HOB elevated   General bed mobility comments: bed mobility improved today, did require extended time  Transfers Overall transfer level: Needs assistance Equipment used: Rolling walker (2 wheeled) Transfers: Sit to/from Stand Sit to Stand: Min guard         General transfer comment: min guard for safety, cues for safety and sequencing  Ambulation/Gait Ambulation/Gait assistance: Min guard Gait Distance (Feet):  30 Feet(1510f) Assistive device: Rolling walker (2 wheeled) Gait Pattern/deviations: Step-to pattern;Step-through pattern;Decreased stride length;Shuffle;Trunk flexed Gait velocity: decreased   General Gait Details: slow gait speed, easily fatigued requiring one seated rest break, poor safety awareness often reaching outside of BOS despite cues note to for safety by PT   Stairs             Wheelchair Mobility    Modified Rankin (Stroke Patients Only)       Balance Overall balance assessment: Needs assistance Sitting-balance support: Feet unsupported Sitting balance-Leahy Scale: Fair Sitting balance - Comments: at edge of stretcher no UE support   Standing balance support: Bilateral upper extremity supported Standing balance-Leahy Scale: Poor Standing balance comment: UE support for balance, due to LE's feeling stiff                            Cognition Arousal/Alertness: Awake/alert Behavior During Therapy: WFL for tasks assessed/performed Overall Cognitive Status: Within Functional Limits for tasks assessed                                        Exercises      General Comments        Pertinent Vitals/Pain Pain Assessment: 0-10 Pain Score: 7  Pain Location: L leg/hip Pain Descriptors / Indicators: Aching;Grimacing;Guarding Pain Intervention(s): Limited activity within patient's tolerance;Repositioned    Home Living  Prior Function            PT Goals (current goals can now be found in the care plan section) Acute Rehab PT Goals Patient Stated Goal: to go to rehab prior to home PT Goal Formulation: With patient Time For Goal Achievement: 12/21/19 Potential to Achieve Goals: Good Progress towards PT goals: Progressing toward goals    Frequency    Min 3X/week      PT Plan Current plan remains appropriate    Co-evaluation              AM-PAC PT "6 Clicks" Mobility   Outcome  Measure  Help needed turning from your back to your side while in a flat bed without using bedrails?: A Little Help needed moving from lying on your back to sitting on the side of a flat bed without using bedrails?: A Little Help needed moving to and from a bed to a chair (including a wheelchair)?: A Little Help needed standing up from a chair using your arms (e.g., wheelchair or bedside chair)?: A Little Help needed to walk in hospital room?: A Little Help needed climbing 3-5 steps with a railing? : A Little 6 Click Score: 18    End of Session Equipment Utilized During Treatment: Gait belt Activity Tolerance: Patient tolerated treatment well Patient left: in bed;with call bell/phone within reach   PT Visit Diagnosis: Other abnormalities of gait and mobility (R26.89);Difficulty in walking, not elsewhere classified (R26.2)     Time: 2060-1561 PT Time Calculation (min) (ACUTE ONLY): 20 min  Charges:  $Gait Training: 8-22 mins                     Windell Norfolk, DPT, PN1   Supplemental Physical Therapist [place]    Pager [phone] Acute Rehab Office [phone]

## 2019-12-09 NOTE — Progress Notes (Addendum)
  [facility] Progress Note   Subjective:  Seen in room. No CP/dyspnea. Did well with dialysis yesterday - she is hoping to go home today, but awaiting PT recs for d/c.  Objective Vitals:   12/09/19 0320 12/09/19 0327 12/09/19 0804 12/09/19 0900  BP: (!) 105/49  107/68   Pulse: 74  90 73  Resp: 18  (!) 22 20  Temp: 98.7 F (37.1 C)  98.2 F (36.8 C)   TempSrc: Oral  Tympanic   SpO2: 96%  92% 97%  Weight:  122.7 kg    Height:       Physical Exam General: Overweight woman, NAD Heart: RRR; no murmur Lungs: CTAB Abdomen: soft, non-tender Extremities: Trace LE edema; scabbing on L shin Dialysis Access: [facility] without tenderness  Additional Objective Labs: Basic Metabolic Panel: Recent Labs  Lab 12/07/19 0317 12/07/19 1543 12/08/19 0500  NA 142 139 145  K 4.9 5.9* 4.9  CL 99 97* 103  CO2 24 25 24   GLUCOSE 151* 181* 115*  BUN 99* 95* 99*  CREATININE 9.83* 9.39* 10.03*  CALCIUM 8.2* 8.2* 7.9*   Liver Function Tests: Recent Labs  Lab 12/08/19 0500  AST 16  ALT 11  ALKPHOS 111  BILITOT 0.4  PROT 6.1*  ALBUMIN 2.9*   CBC: Recent Labs  Lab 12/07/19 1543 12/08/19 0500  WBC 6.7 6.2  NEUTROABS 5.0  --   HGB 10.3* 10.0*  HCT 33.7* 33.3*  MCV 91.8 93.5  PLT 308 325   Medications:  . allopurinol  100 mg Oral BID  . aspirin EC  325 mg Oral Daily  . brinzolamide  1 drop Both Eyes BID  . buPROPion  150 mg Oral BID  . Chlorhexidine Gluconate Cloth  6 each Topical Q0600  . ferrous sulfate  325 mg Oral BID WC  . furosemide  40 mg Oral BID  . heparin  5,000 Units Subcutaneous Q8H  . insulin aspart  0-5 Units Subcutaneous QHS  . insulin aspart  0-6 Units Subcutaneous TID WC  . latanoprost  1 drop Both Eyes QHS  . linaclotide  72 mcg Oral QAC breakfast  . loratadine  10 mg Oral Daily  . metoprolol tartrate  25 mg Oral BID  . mometasone-formoterol  2 puff Inhalation BID  . pantoprazole  40 mg Oral Daily  . pravastatin  40 mg Oral Daily  .  sucroferric oxyhydroxide  500 mg Oral TID WC    Dialysis Orders: TTS at [facility] 4:15hr, 400/800, EDW 119.5kg, 2K/2Ca, TDC, no heparin - Mircera 200 q 2 weeks (last 11/25) - Calcitriol 0.72mcg PO q HD  Assessment/Plan: 1.  Hyperkalemia: Improved with medication management and dialysis. 2.  Weakness: Ongoing issue. SW and PT evaluating for plan. 3.  ESRD: Usual TTS sched, next HD 12/12 - either here or outpatient. 4.  Hypertension/volume: BP ok, some edema present. 5.  Anemia: Hgb 10 - follow. 6.  Metabolic bone disease: Corr Ca ok, Phos pending. Follow. 7.  CAD (Hx CABG) 8.  PAD   Veneta Penton, PA-C 12/09/2019, 11:19 AM  [facility] Pager: [phone]

## 2019-12-09 NOTE — Progress Notes (Signed)
[facility] PROGRESS NOTE  [name] [name] TGG:[id number] DOB: [birth year] DOA: 12/07/2019 PCP: [facility]  Assessment/Plan:  Hyperkalemia. Resolved.  Related to missing dialysis. Received Calcium gluconate 1gm iv x1, Sodium bicarbonate 1 amp iv x1, Kayexalate 30gm po x1. Nephrology consulted and dialysis scheduled on 12/10. Next due 12/12 either inpatient or OP. Of note PT recommends snf and patient agreed. Awaiting bed  Gait instability/frequent falls. Chronic issue but worsening per patient. Hx cva with left leg weakness.  Physical therapy evaluated and recommended snf. Chart indicates husband wants snf as he is unable to care for her. Awaiting bed at snf.   ESRD on HD TTS evaluated by nephrology. dialized 12/10 and is due 12/12 if still inpatient.  Cont Velphoro  H/o CVA stable at baseline Cont Aspirin  Cont Pravastatin 40mg  po qhs  Anemia likely related to chronic disease. Hg 10. No s/sx active bleeding Cont Ferrous sulfate  Hypertension controlled. Home meds include lasix, metoprolol,  Cont Metoprolol 12.5mg  po bid Continue lasix  Dm2 controlled. HgA1c 5.2 3 months ago Cont Novolog Fsbs ac and qhs, ISS  Diabetic neuropathy STOP Gabapentin Need to decrease dose since renally cleared.  Constipation Cont Linzess  Cont Miralax  Gerd Cont PPI  Glaucoma Cont Azopt Cont Xalatan  Asthma Cont Dulera Cont ProAir  Anxiety Cont WEllbutrin SR 150mg  po bid   Code Status: full code Family Communication: patient Disposition Plan: snf   Consultants:  [facility]  Procedures:  Dialysis 12/10  Antibiotics:    HPI/Subjective: Awake alert. Complains of difficulty sleeping last night but reports feeling "better"  Objective: Vitals:   12/09/19 0804 12/09/19 0900  BP: 107/68   Pulse: 90 73  Resp: (!) 22 20  Temp: 98.2 F (36.8 C)   SpO2: 92% 97%    Intake/Output Summary (Last 24 hours) at 12/09/2019  1144 Last data filed at 12/09/2019 0900 Gross per 24 hour  Intake 480 ml  Output 2200 ml  Net -1720 ml   Filed Weights   12/09/19 0327  Weight: 122.7 kg    Exam:   General:  Awake obese no acute distress  Cardiovascular: rrr no mgr no LE edeam  Respiratory: normal effort BS clear bilaterally no wheeze  Abdomen: obese soft +BS no guarding or rebounding  Musculoskeletal: joints without swelling/erythema   Data Reviewed: Basic Metabolic Panel: Recent Labs  Lab 12/07/19 0317 12/07/19 1543 12/08/19 0500  NA 142 139 145  K 4.9 5.9* 4.9  CL 99 97* 103  CO2 24 25 24   GLUCOSE 151* 181* 115*  BUN 99* 95* 99*  CREATININE 9.83* 9.39* 10.03*  CALCIUM 8.2* 8.2* 7.9*   Liver Function Tests: Recent Labs  Lab 12/08/19 0500  AST 16  ALT 11  ALKPHOS 111  BILITOT 0.4  PROT 6.1*  ALBUMIN 2.9*   No results for input(s): LIPASE, AMYLASE in the last 168 hours. No results for input(s): AMMONIA in the last 168 hours. CBC: Recent Labs  Lab 12/07/19 1543 12/08/19 0500  WBC 6.7 6.2  NEUTROABS 5.0  --   HGB 10.3* 10.0*  HCT 33.7* 33.3*  MCV 91.8 93.5  PLT 308 325   Cardiac Enzymes: No results for input(s): CKTOTAL, CKMB, CKMBINDEX, TROPONINI in the last 168 hours. BNP (last 3 results) Recent Labs    04/27/19 1942 06/01/19 2322 08/16/19 0934  BNP 619.8* 331.8* 250.5*    ProBNP (last 3 results) No results for input(s): PROBNP in the last 8760 hours.  CBG: Recent  Labs  Lab 12/08/19 0148 12/08/19 0923 12/08/19 1150 12/08/19 2159 12/09/19 1114  GLUCAP 196* 101* 203* 155* 150*    Recent Results (from the past 240 hour(s))  SARS CORONAVIRUS 2 (TAT 6-24 HRS) Nasopharyngeal Nasopharyngeal Swab     Status: None   Collection Time: 12/08/19  9:47 AM   Specimen: Nasopharyngeal Swab  Result Value Ref Range Status   SARS Coronavirus 2 NEGATIVE NEGATIVE Final    Comment: (NOTE) SARS-CoV-2 target nucleic acids are NOT DETECTED. The SARS-CoV-2 RNA is generally  detectable in upper and lower respiratory specimens during the acute phase of infection. Negative results do not preclude SARS-CoV-2 infection, do not rule out co-infections with other pathogens, and should not be used as the sole basis for treatment or other patient management decisions. Negative results must be combined with clinical observations, patient history, and epidemiological information. The expected result is Negative. Fact Sheet for Patients: SugarRoll.be Fact Sheet for Healthcare Providers: https://www.woods-mathews.com/ This test is not yet approved or cleared by the [place] FDA and  has been authorized for detection and/or diagnosis of SARS-CoV-2 by FDA under an Emergency Use Authorization (EUA). This EUA will remain  in effect (meaning this test can be used) for the duration of the COVID-19 declaration under Section 56 4(b)(1) of the Act, 21 U.S.C. section 360bbb-3(b)(1), unless the authorization is terminated or revoked sooner. Performed at [facility], [place] [address], [place], [place] [address]   MRSA PCR Screening     Status: None   Collection Time: 12/09/19 12:07 AM   Specimen: Nasal Mucosa; Nasopharyngeal  Result Value Ref Range Status   MRSA by PCR NEGATIVE NEGATIVE Final    Comment:        The GeneXpert MRSA Assay (FDA approved for NASAL specimens only), is one component of a comprehensive MRSA colonization surveillance program. It is not intended to diagnose MRSA infection nor to guide or monitor treatment for MRSA infections. Performed at [facility], [place] [address], [place], [place] [address]      Studies: No results found.  Scheduled Meds: . allopurinol  100 mg Oral BID  . aspirin EC  325 mg Oral Daily  . brinzolamide  1 drop Both Eyes BID  . buPROPion  150 mg Oral BID  . Chlorhexidine Gluconate Cloth  6 each Topical Q0600  . ferrous sulfate  325 mg Oral BID WC  . furosemide  40  mg Oral BID  . heparin  5,000 Units Subcutaneous Q8H  . insulin aspart  0-5 Units Subcutaneous QHS  . insulin aspart  0-6 Units Subcutaneous TID WC  . latanoprost  1 drop Both Eyes QHS  . linaclotide  72 mcg Oral QAC breakfast  . loratadine  10 mg Oral Daily  . metoprolol tartrate  25 mg Oral BID  . mometasone-formoterol  2 puff Inhalation BID  . pantoprazole  40 mg Oral Daily  . pravastatin  40 mg Oral Daily  . sucroferric oxyhydroxide  500 mg Oral TID WC   Continuous Infusions:  Principal Problem:   Hyperkalemia Active Problems:   Essential hypertension   ESRD (end stage renal disease) (HCC)   History of CVA (cerebrovascular accident)   History of cocaine abuse ([place])   Coronary artery disease   Type 2 diabetes mellitus with chronic kidney disease on chronic dialysis, with long-term current use of insulin ([place])   Hyperlipidemia    Time spent: 13 minutes    Radene Gunning NP  [facility]  If 7PM-7AM, please contact night-coverage at [email or web address], password [facility] 12/09/2019, 11:44 AM  LOS: 0 days

## 2019-12-09 NOTE — Progress Notes (Signed)
Initial Nutrition Assessment  DOCUMENTATION CODES:   Obesity unspecified  INTERVENTION:   30 ml Prostat BID, each supplement provides 100 kcals and 15 grams protein.   Add Renal MVI daily  NUTRITION DIAGNOSIS:   Increased nutrient needs related to chronic illness as evidenced by estimated needs.  GOAL:   Patient will meet greater than or equal to 90% of their needs  MONITOR:   PO intake, Labs, Weight trends  REASON FOR ASSESSMENT:   Malnutrition Screening Tool    ASSESSMENT:   68 yo female admitted with hyperkalemia after missed HD (last HD 12/5), weakness with frequent falls. PMH includes ESRD/HD, CAD, CHF, DM with neuropathy, PVD, CVA   RD working remotely.  Hyperkalemia resolved post emergent HD.   Noted pt with multiple falls recently; PT recommending SNF, pt and husband agree. Awaiting bed  Recorded po intake 50-100%, 75% on average  EDW 119.5 kg; current wt 122.7 kg  Labs: CBGs 101-203, corrected calcium 8.8 (albumin 2.9), no phosphorus Meds: ferrous sulfate, lasix, ss novolog, velphoro    Diet Order:   Diet Order            Diet renal/carb modified with fluid restriction Diet-HS Snack? Nothing; Fluid restriction: 1200 mL Fluid; Room service appropriate? Yes; Fluid consistency: Thin  Diet effective now              EDUCATION NEEDS:   No education needs have been identified at this time  Skin:  Skin Assessment: Reviewed RN Assessment  Last BM:  12/11  Height:   Ht Readings from Last 1 Encounters:  12/08/19 5\' 8"  (1.727 m)    Weight:   Wt Readings from Last 1 Encounters:  12/09/19 122.7 kg    Ideal Body Weight:  63.6 kg  BMI:  Body mass index is 41.14 kg/m.  Estimated Nutritional Needs:   Kcal:  1800-2000 kcals  Protein:  100-110 g  Fluid:  1000 mL plus UOP   [employer] MS, RDN, LDN, CNSC [phone] Pager  [phone] Weekend/On-Call Pager

## 2019-12-10 LAB — GLUCOSE, CAPILLARY
Glucose-Capillary: 86 mg/dL (ref 70–99)
Glucose-Capillary: 152 mg/dL — ABNORMAL HIGH (ref 70–99)
Glucose-Capillary: 119 mg/dL — ABNORMAL HIGH (ref 70–99)
Glucose-Capillary: 176 mg/dL — ABNORMAL HIGH (ref 70–99)
Glucose-Capillary: 263 mg/dL — ABNORMAL HIGH (ref 70–99)

## 2019-12-10 LAB — BASIC METABOLIC PANEL
Anion gap: 15 (ref 5–15)
BUN: 72 mg/dL — ABNORMAL HIGH (ref 8–23)
CO2: 25 mmol/L (ref 22–32)
Calcium: 7.7 mg/dL — ABNORMAL LOW (ref 8.9–10.3)
Chloride: 103 mmol/L (ref 98–111)
Creatinine, Ser: 7.17 mg/dL — ABNORMAL HIGH (ref 0.44–1.00)
GFR calc Af Amer: 6 mL/min — ABNORMAL LOW (ref >60)
GFR calc non Af Amer: 5 mL/min — ABNORMAL LOW (ref >60)
Glucose, Bld: 119 mg/dL — ABNORMAL HIGH (ref 70–99)
Potassium: 4.2 mmol/L (ref 3.5–5.1)
Sodium: 143 mmol/L (ref 135–145)

## 2019-12-10 LAB — FERRITIN: Ferritin: 128 ng/mL (ref 11–307)

## 2019-12-10 LAB — CBC
HCT: 27.7 % — ABNORMAL LOW (ref 36.0–46.0)
Hemoglobin: 8.3 g/dL — ABNORMAL LOW (ref 12.0–15.0)
MCH: 27.5 pg (ref 26.0–34.0)
MCHC: 30 g/dL (ref 30.0–36.0)
MCV: 91.7 fL (ref 80.0–100.0)
Platelets: 245 10*3/uL (ref 150–400)
RBC: 3.02 MIL/uL — ABNORMAL LOW (ref 3.87–5.11)
RDW: 17.6 % — ABNORMAL HIGH (ref 11.5–15.5)
WBC: 4.1 10*3/uL (ref 4.0–10.5)
nRBC: 0 % (ref 0.0–0.2)

## 2019-12-10 LAB — URIC ACID: Uric Acid, Serum: 2 mg/dL — ABNORMAL LOW (ref 2.5–7.1)

## 2019-12-10 LAB — IRON AND TIBC
Iron: 99 ug/dL (ref 28–170)
Saturation Ratios: 38 % — ABNORMAL HIGH (ref 10.4–31.8)
TIBC: 260 ug/dL (ref 250–450)
UIBC: 161 ug/dL

## 2019-12-10 MED ORDER — PREDNISONE 20 MG PO TABS
50.0000 mg | ORAL_TABLET | Freq: Every day | ORAL | Status: AC
  Administered 2019-12-10: 50 mg via ORAL
  Filled 2019-12-10: qty 2

## 2019-12-10 MED ORDER — COLCHICINE 0.3 MG HALF TABLET
0.6000 mg | ORAL_TABLET | Freq: Once | ORAL | Status: AC
  Administered 2019-12-10: 0.6 mg via ORAL
  Filled 2019-12-10: qty 2

## 2019-12-10 MED ORDER — HEPARIN SODIUM (PORCINE) 1000 UNIT/ML IJ SOLN
INTRAMUSCULAR | Status: AC
  Administered 2019-12-10: 3000 [IU] via ARTERIOVENOUS_FISTULA
  Filled 2019-12-10: qty 3

## 2019-12-10 MED ORDER — DARBEPOETIN ALFA 200 MCG/0.4ML IJ SOSY
200.0000 ug | PREFILLED_SYRINGE | INTRAMUSCULAR | Status: DC
  Administered 2019-12-10: 200 ug via INTRAVENOUS

## 2019-12-10 MED ORDER — SODIUM CHLORIDE 0.9 % IV SOLN
62.5000 mg | INTRAVENOUS | Status: DC
  Administered 2019-12-10: 62.5 mg via INTRAVENOUS
  Filled 2019-12-10 (×2): qty 5

## 2019-12-10 MED ORDER — DARBEPOETIN ALFA 200 MCG/0.4ML IJ SOSY
PREFILLED_SYRINGE | INTRAMUSCULAR | Status: AC
  Filled 2019-12-10: qty 0.4

## 2019-12-10 NOTE — Progress Notes (Addendum)
[name] Progress Note   Dialysis Orders: TTS at [facility] 4:15hr, 400/800, EDW 119.5kg, 2K/2Ca, TDC, no heparin - Mircera 200 q 2 weeks (last 11/25) - Calcitriol 0.42mcg PO q HD venofer 50/week -   Assessment/Plan: 1. Hyperkalemia: resolved with medication management and dialysis. 2. Weakness/ falls:Ongoing issue. SW and PT evaluating for plan. 3. ESRD:TTS K 4.2  4. Hypertension/volume: BP coming down on HD  5. Anemia:Hgb 10> 8.3 - ?? Why such a drop. Last tsat 14% in Nov - had a course of IV Fe- resume weekly Fe - stop oral Fe (due to resume at d/c ) - repeat Fe sturdies today - can drawn on HD - resume ESA today - was due fo redose 12/10- hx of heme positive stools Aug and Oct 6. Metabolic bone disease:Corr Ca ok, Phos pending. Follow. 7. CAD (Hx CABG) 8. PAD 9. Dispo - looks like SNF placement  [name], PA-C [place] [phone] 12/10/2019,9:20 AM  LOS: 1 day   Pt seen, examined and agree w A/P as above.  [name] Splinter  MD 12/10/2019, 12:28 PM    Subjective:   C/o pain on the bottom of left foot.   Objective Vitals:   12/10/19 0723 12/10/19 0730 12/10/19 0800 12/10/19 0830  BP: 119/60 112/64 125/87 (!) 122/58  Pulse: 73 70 72 74  Resp: 18 18 18 19   Temp:      TempSrc:      SpO2: 98%     Weight:      Height:       Physical Exam goal 3.5  General: NAD on HD Heart: RRR Lungs: grossly clear Abdomen: obese osft NT Extremities: no sig LE edema tender plantar surface left foot - no pain with flexion/extension/no wounds Dialysis Access:  [facility]    Additional Objective Labs: Basic Metabolic Panel: Recent Labs  Lab 12/07/19 1543 12/08/19 0500 12/10/19 0341  NA 139 145 143  K 5.9* 4.9 4.2  CL 97* 103 103  CO2 25 24 25   GLUCOSE 181* 115* 119*  BUN 95* 99* 72*  CREATININE 9.39* 10.03* 7.17*  CALCIUM 8.2* 7.9* 7.7*   Liver Function Tests: Recent Labs  Lab 12/08/19 0500  AST 16  ALT 11  ALKPHOS  111  BILITOT 0.4  PROT 6.1*  ALBUMIN 2.9*   No results for input(s): LIPASE, AMYLASE in the last 168 hours. CBC: Recent Labs  Lab 12/07/19 1543 12/08/19 0500 12/10/19 0753  WBC 6.7 6.2 4.1  NEUTROABS 5.0  --   --   HGB 10.3* 10.0* 8.3*  HCT 33.7* 33.3* 27.7*  MCV 91.8 93.5 91.7  PLT 308 325 245   Blood Culture    Component Value Date/Time   SDES BLOOD LEFT HAND 08/19/2019 0714   SDES BLOOD LEFT HAND 08/19/2019 0714   SPECREQUEST  08/19/2019 0714    BOTTLES DRAWN AEROBIC ONLY Blood Culture adequate volume   SPECREQUEST  08/19/2019 0714    BOTTLES DRAWN AEROBIC ONLY Blood Culture adequate volume   CULT  08/19/2019 0714    NO GROWTH 5 DAYS Performed at [place] [address] [place], [place] [address]    CULT  08/19/2019 [phone]    NO GROWTH 5 DAYS Performed at [facility], [place] [address] [place], [place] [address]    REPTSTATUS 08/24/2019 FINAL 08/19/2019 0714   REPTSTATUS 08/24/2019 FINAL 08/19/2019 0714    Cardiac Enzymes: No results for input(s): CKTOTAL, CKMB, CKMBINDEX, TROPONINI in the last 168  hours. CBG: Recent Labs  Lab 12/08/19 0923 12/08/19 1150 12/08/19 2159 12/09/19 1114 12/09/19 1649  GLUCAP 101* 203* 155* 150* 266*   Iron Studies: No results for input(s): IRON, TIBC, TRANSFERRIN, FERRITIN in the last 72 hours. Lab Results  Component Value Date   INR 1.2 08/25/2019   INR 1.2 04/28/2019   INR 1.17 09/23/2016   Studies/Results: No results found. Medications:  . allopurinol  100 mg Oral BID  . aspirin EC  325 mg Oral Daily  . brinzolamide  1 drop Both Eyes BID  . buPROPion  150 mg Oral BID  . Chlorhexidine Gluconate Cloth  6 each Topical Q0600  . feeding supplement (PRO-STAT SUGAR FREE 64)  30 mL Oral BID  . ferrous sulfate  325 mg Oral BID WC  . furosemide  40 mg Oral BID  . heparin  5,000 Units Subcutaneous Q8H  . insulin aspart  0-5 Units Subcutaneous QHS  . insulin aspart  0-6 Units Subcutaneous TID WC  .  latanoprost  1 drop Both Eyes QHS  . linaclotide  72 mcg Oral QAC breakfast  . loratadine  10 mg Oral Daily  . metoprolol tartrate  25 mg Oral BID  . mometasone-formoterol  2 puff Inhalation BID  . multivitamin  1 tablet Oral QHS  . pantoprazole  40 mg Oral Daily  . pravastatin  40 mg Oral Daily  . sucroferric oxyhydroxide  500 mg Oral TID WC

## 2019-12-10 NOTE — Progress Notes (Signed)
PROGRESS NOTE    [name] [name]  ACZ:[id number] DOB: Jan 29, 1951 DOA: 12/07/2019 PCP: [facility]  Brief Narrative: morbidly obese 68 year old female with history of ESRD on hemodialysis, remote CVA with left hemiparesis, chronic systolic and diastolic CHF, anemia of chronic disease, gait disorder presented to the ED following yet another fall, she had missed hemodialysis as she was not able to walk down 2 flights of stairs the previous day, history of longstanding gait issues worse since September   Assessment & Plan:  Hyperkalemia from missed hemodialysis -Corrected with HD  Gait disorder, frequent falls -Ongoing issue for years, worse since September -Patient reports that she suffered a stroke in the 70s, 43s which resulted in left leg weakness, she has had longstanding balance problems which have worsened since September 2020 resulting in numerous falls, missed hemodialysis etc. -She tells me that often her left knee locks up when she tries to stand also has some pain in her left leg, and prior deficits, worsened by morbid obesity -No neurological symptoms reported -PT OT evaluation completed, SNF recommended for rehabilitation -She is unsafe to discharge home at this time and is awaiting SNF for rehabilitation -Social work consulted and following  Suspected gout, left foot -Since yesterday evening complains of tenderness in her left toes and ankles as well -Exam concerning for gout flare, check uric acid, give prednisone x1 and colchicine   ESRD on HD Tuesday Thursday Saturday -Nephrology consulting, dialysis today  Morbid obesity  Anemia of chronic disease -Stable  H/o CVAstable at baseline -Continue aspirin and statin  Hypertensioncontrolled. Home meds include lasix, metoprolol, Cont Metoprolol 12.5mg  po bid -Still takes Lasix, makes small amounts of urine  Dm2controlled. HgA1c 5.2 3 months ago Cont Novolog -CBGs are stable may bump  up with prednisone, monitor  Diabetic neuropathy -Resume gabapentin at renal dose  Constipation Cont Linzess  Cont Miralax  Gerd Cont PPI  Glaucoma Cont Azopt Cont Xalatan  DVT prophylaxis: Heparin subcutaneous Code Status: Full code Family Communication: No family at bedside Disposition Plan: SNF when bed available  Consultants:   Nephrology   Procedures:   Antimicrobials:    Subjective: -Complains of pain in her left ankle and toes  Objective: Vitals:   12/10/19 0900 12/10/19 0930 12/10/19 1000 12/10/19 1030  BP: (!) 112/59 103/66 114/61 (!) 100/57  Pulse: 78 72 62 80  Resp: (!) 21 (!) 22 16 18   Temp:    (P) 98.7 F (37.1 C)  TempSrc:    Oral  SpO2:    (P) 100%  Weight:    121.1 kg  Height:        Intake/Output Summary (Last 24 hours) at 12/10/2019 1138 Last data filed at 12/10/2019 1030 Gross per 24 hour  Intake 120 ml  Output 2369 ml  Net -2249 ml   Filed Weights   12/10/19 0345 12/10/19 0718 12/10/19 1030  Weight: 123 kg 123.7 kg 121.1 kg    Examination:  General exam: Obese pleasant female seen on dialysis AAO x3, no distress Respiratory system: Clear to auscultation.  Cardiovascular system: S1 & S2 heard, RRR  Gastrointestinal system: Abdomen is nondistended, soft and nontender.Normal bowel sounds heard. Central nervous system: Alert and oriented mild left hemiparesis, chronic Extremities: Left great toe MTP, left ankle with tenderness and limited range of motion Skin: No rashes, lesions or ulcers Psychiatry: Judgement and insight appear normal. Mood & affect appropriate.     Data Reviewed:   CBC: Recent Labs  Lab 12/07/19 1543  12/08/19 0500 12/10/19 0753  WBC 6.7 6.2 4.1  NEUTROABS 5.0  --   --   HGB 10.3* 10.0* 8.3*  HCT 33.7* 33.3* 27.7*  MCV 91.8 93.5 91.7  PLT 308 325 326   Basic Metabolic Panel: Recent Labs  Lab 12/07/19 0317 12/07/19 1543 12/08/19 0500 12/10/19 0341  NA 142 139 145 143  K 4.9 5.9* 4.9  4.2  CL 99 97* 103 103  CO2 24 25 24 25   GLUCOSE 151* 181* 115* 119*  BUN 99* 95* 99* 72*  CREATININE 9.83* 9.39* 10.03* 7.17*  CALCIUM 8.2* 8.2* 7.9* 7.7*   GFR: Estimated Creatinine Clearance: 10.3 mL/min (A) (by C-G formula based on SCr of 7.17 mg/dL (H)). Liver Function Tests: Recent Labs  Lab 12/08/19 0500  AST 16  ALT 11  ALKPHOS 111  BILITOT 0.4  PROT 6.1*  ALBUMIN 2.9*   No results for input(s): LIPASE, AMYLASE in the last 168 hours. No results for input(s): AMMONIA in the last 168 hours. Coagulation Profile: No results for input(s): INR, PROTIME in the last 168 hours. Cardiac Enzymes: No results for input(s): CKTOTAL, CKMB, CKMBINDEX, TROPONINI in the last 168 hours. BNP (last 3 results) No results for input(s): PROBNP in the last 8760 hours. HbA1C: No results for input(s): HGBA1C in the last 72 hours. CBG: Recent Labs  Lab 12/08/19 0923 12/08/19 1150 12/08/19 2159 12/09/19 1114 12/09/19 1649  GLUCAP 101* 203* 155* 150* 266*   Lipid Profile: No results for input(s): CHOL, HDL, LDLCALC, TRIG, CHOLHDL, LDLDIRECT in the last 72 hours. Thyroid Function Tests: No results for input(s): TSH, T4TOTAL, FREET4, T3FREE, THYROIDAB in the last 72 hours. Anemia Panel: No results for input(s): VITAMINB12, FOLATE, FERRITIN, TIBC, IRON, RETICCTPCT in the last 72 hours. Urine analysis:    Component Value Date/Time   COLORURINE YELLOW 08/19/2019 0712   APPEARANCEUR HAZY (A) 08/19/2019 0712   LABSPEC 1.012 08/19/2019 0712   PHURINE 5.0 08/19/2019 0712   GLUCOSEU NEGATIVE 08/19/2019 0712   HGBUR NEGATIVE 08/19/2019 0712   BILIRUBINUR NEGATIVE 08/19/2019 0712   KETONESUR NEGATIVE 08/19/2019 0712   PROTEINUR NEGATIVE 08/19/2019 0712   UROBILINOGEN 0.2 05/12/2015 0938   NITRITE NEGATIVE 08/19/2019 0712   LEUKOCYTESUR SMALL (A) 08/19/2019 0712   Sepsis Labs: @LABRCNTIP (procalcitonin:4,lacticidven:4)  ) Recent Results (from the past 240 hour(s))  SARS CORONAVIRUS 2  (TAT 6-24 HRS) Nasopharyngeal Nasopharyngeal Swab     Status: None   Collection Time: 12/08/19  9:47 AM   Specimen: Nasopharyngeal Swab  Result Value Ref Range Status   SARS Coronavirus 2 NEGATIVE NEGATIVE Final    Comment: (NOTE) SARS-CoV-2 target nucleic acids are NOT DETECTED. The SARS-CoV-2 RNA is generally detectable in upper and lower respiratory specimens during the acute phase of infection. Negative results do not preclude SARS-CoV-2 infection, do not rule out co-infections with other pathogens, and should not be used as the sole basis for treatment or other patient management decisions. Negative results must be combined with clinical observations, patient history, and epidemiological information. The expected result is Negative. Fact Sheet for Patients: SugarRoll.be Fact Sheet for Healthcare Providers: https://www.woods-mathews.com/ This test is not yet approved or cleared by the [place] FDA and  has been authorized for detection and/or diagnosis of SARS-CoV-2 by FDA under an Emergency Use Authorization (EUA). This EUA will remain  in effect (meaning this test can be used) for the duration of the COVID-19 declaration under Section 56 4(b)(1) of the Act, 21 U.S.C. section 360bbb-3(b)(1), unless the authorization is terminated or  revoked sooner. Performed at [facility], [place] [address] [place], [place] [address]   MRSA PCR Screening     Status: None   Collection Time: 12/09/19 12:07 AM   Specimen: Nasal Mucosa; Nasopharyngeal  Result Value Ref Range Status   MRSA by PCR NEGATIVE NEGATIVE Final    Comment:        The GeneXpert MRSA Assay (FDA approved for NASAL specimens only), is one component of a comprehensive MRSA colonization surveillance program. It is not intended to diagnose MRSA infection nor to guide or monitor treatment for MRSA infections. Performed at [facility], [place] [address]  [place], [place] [address]          Radiology Studies: No results found.      Scheduled Meds: . allopurinol  100 mg Oral BID  . aspirin EC  325 mg Oral Daily  . brinzolamide  1 drop Both Eyes BID  . buPROPion  150 mg Oral BID  . Chlorhexidine Gluconate Cloth  6 each Topical Q0600  . colchicine  0.3 mg Oral Daily  . Darbepoetin Alfa      . darbepoetin (ARANESP) injection - DIALYSIS  200 mcg Intravenous Q Sat-HD  . feeding supplement (PRO-STAT SUGAR FREE 64)  30 mL Oral BID  . furosemide  40 mg Oral BID  . heparin      . heparin  5,000 Units Subcutaneous Q8H  . insulin aspart  0-5 Units Subcutaneous QHS  . insulin aspart  0-6 Units Subcutaneous TID WC  . latanoprost  1 drop Both Eyes QHS  . linaclotide  72 mcg Oral QAC breakfast  . loratadine  10 mg Oral Daily  . metoprolol tartrate  25 mg Oral BID  . mometasone-formoterol  2 puff Inhalation BID  . multivitamin  1 tablet Oral QHS  . pantoprazole  40 mg Oral Daily  . pravastatin  40 mg Oral Daily  . predniSONE  50 mg Oral Q breakfast  . sucroferric oxyhydroxide  500 mg Oral TID WC   Continuous Infusions: . ferric gluconate (FERRLECIT/NULECIT) IV       LOS: 1 day    Time spent: 20min    Domenic Polite, MD [facility]   12/10/2019, 11:38 AM

## 2019-12-11 LAB — CBC
HCT: 27 % — ABNORMAL LOW (ref 36.0–46.0)
Hemoglobin: 8.5 g/dL — ABNORMAL LOW (ref 12.0–15.0)
MCH: 28.2 pg (ref 26.0–34.0)
MCHC: 31.5 g/dL (ref 30.0–36.0)
MCV: 89.7 fL (ref 80.0–100.0)
Platelets: 240 10*3/uL (ref 150–400)
RBC: 3.01 MIL/uL — ABNORMAL LOW (ref 3.87–5.11)
RDW: 17.4 % — ABNORMAL HIGH (ref 11.5–15.5)
WBC: 9.2 10*3/uL (ref 4.0–10.5)
nRBC: 0 % (ref 0.0–0.2)

## 2019-12-11 LAB — GLUCOSE, CAPILLARY
Glucose-Capillary: 169 mg/dL — ABNORMAL HIGH (ref 70–99)
Glucose-Capillary: 104 mg/dL — ABNORMAL HIGH (ref 70–99)
Glucose-Capillary: 256 mg/dL — ABNORMAL HIGH (ref 70–99)
Glucose-Capillary: 314 mg/dL — ABNORMAL HIGH (ref 70–99)

## 2019-12-11 MED ORDER — CALCITRIOL 0.5 MCG PO CAPS
0.7500 ug | ORAL_CAPSULE | Freq: Once | ORAL | Status: AC
  Administered 2019-12-11: 0.75 ug via ORAL
  Filled 2019-12-11: qty 1

## 2019-12-11 MED ORDER — CALCITRIOL 0.5 MCG PO CAPS: 0.7500 ug | ORAL_CAPSULE | ORAL | Status: DC

## 2019-12-11 MED ORDER — PREDNISONE 20 MG PO TABS
40.0000 mg | ORAL_TABLET | Freq: Every day | ORAL | Status: AC
  Administered 2019-12-11: 40 mg via ORAL
  Filled 2019-12-11: qty 2

## 2019-12-11 NOTE — TOC Progression Note (Signed)
Transition of Care (TOC) - Progression Note    Patient Details  Name: [name] [name] MRN: [id number] Date of Birth: 01/28/1951  Transition of Care (TOC) CM/SW Contact  Lisa E Bracken, LCSW Phone Number: [phone]    12/11/2019, 11:29 AM  Clinical Narrative:     CSW met with patient to discuss bed offers. Patient informed CSW that she would like to return home with HH. Patient stated that her husband is there to support her and he would like for her to return home as well. Patient stated that she would like to continue with [facility] for HH. CSW informed patient that she would inform RN.  TOC team will continue to follow for discharge planning needs.  Expected Discharge Plan: Skilled Nursing Facility Barriers to Discharge: Continued Medical Work up  Expected Discharge Plan and Services Expected Discharge Plan: Skilled Nursing Facility       Living arrangements for the past 2 months: Single Family Home                                       Social Determinants of Health (SDOH) Interventions    Readmission Risk Interventions Readmission Risk Prevention Plan 10/08/2019 08/26/2019 06/03/2019  Transportation Screening - Complete -  Medication Review (RN Care Manager) - Complete -  PCP or Specialist appointment within 3-5 days of discharge - Complete Complete  HRI or Home Care Consult - Complete -  SW Recovery Care/Counseling Consult - Complete -  Palliative Care Screening Not Applicable Not Applicable -  Skilled Nursing Facility - Not Applicable -  Some recent data might be hidden   

## 2019-12-11 NOTE — Progress Notes (Addendum)
[facility] Progress Note   Dialysis Orders: TTS at [facility] 4:15hr, 400/800, EDW 119.5kg, 2K/2Ca, TDC, no heparin - Mircera 200 q 2 weeks (last 11/25) - Calcitriol 0.7mcg PO q HD venofer 50/week -   Assessment/Plan: 1. Hyperkalemia: resolved with medication management and dialysis. 2. Weakness/ falls/gait disorder:Ongoing issue. SW and PT evaluating for plan.  3. ESRD:TTS -next HD Tuesday 4. Hypertension/volume: BP coming down on HD net UF 2.4 L 12/12 - standing post wt 121.1 - still above EDW  5. Anemia:Hgb 10> 8.3 - ?? Why such a drop. Last tsat 14% in Nov - had a course of IV Fe- resume weekly Fe - stop oral Fe (due to resume at d/c ) - tsat 38% 12/12- resumed ESA 12/12 - hx of heme positive stools Aug and Oct 6. Metabolic bone disease:Corr Ca ok, Phos no checked -resumed calcitriol 7. CAD (Hx CABG) 8. PAD 9. Disp - looks like possible SNF placement  [name], PA-C [place] [phone] 12/11/2019,8:06 AM  LOS: 2 days   Pt seen, examined and agree w A/P as above.  [name]  MD 12/11/2019, 1:36 PM     Subjective:   "Feels like a million dollars" slept well. Ate breakfast. Up to the potty chair - went alone but nursing told her to ask for assistance.  Objective Vitals:   12/10/19 2059 12/10/19 2228 12/11/19 0440 12/11/19 0755  BP: 108/64 135/78 (!) 133/58 (!) 105/57  Pulse: (!) 53 74 69 72  Resp: 18  (!) 22 18  Temp: 98.9 F (37.2 C)  98.5 F (36.9 C)   TempSrc: Oral  Oral   SpO2: 100%  97% 99%  Weight:   120.8 kg   Height:       Physical Exam  General: upbeat and animated today breathing easily on room air Heart: RRR Lungs: grossly clear Abdomen: obese osft NT Extremities: no sig LE edema  Dialysis Access:  Right IJ  [facility]    Additional Objective Labs: Basic Metabolic Panel: Recent Labs  Lab 12/07/19 1543 12/08/19 0500 12/10/19 0341  NA 139 145 143  K 5.9* 4.9 4.2  CL 97* 103 103  CO2 25  24 25   GLUCOSE 181* 115* 119*  BUN 95* 99* 72*  CREATININE 9.39* 10.03* 7.17*  CALCIUM 8.2* 7.9* 7.7*   Liver Function Tests: Recent Labs  Lab 12/08/19 0500  AST 16  ALT 11  ALKPHOS 111  BILITOT 0.4  PROT 6.1*  ALBUMIN 2.9*   No results for input(s): LIPASE, AMYLASE in the last 168 hours. CBC: Recent Labs  Lab 12/07/19 1543 12/08/19 0500 12/10/19 0753  WBC 6.7 6.2 4.1  NEUTROABS 5.0  --   --   HGB 10.3* 10.0* 8.3*  HCT 33.7* 33.3* 27.7*  MCV 91.8 93.5 91.7  PLT 308 325 245   Blood Culture    Component Value Date/Time   SDES BLOOD LEFT HAND 08/19/2019 0714   SDES BLOOD LEFT HAND 08/19/2019 0714   SPECREQUEST  08/19/2019 0714    BOTTLES DRAWN AEROBIC ONLY Blood Culture adequate volume   SPECREQUEST  08/19/2019 0714    BOTTLES DRAWN AEROBIC ONLY Blood Culture adequate volume   CULT  08/19/2019 0714    NO GROWTH 5 DAYS Performed at [place] [facility] [address], [place], [place] [address]    CULT  08/19/2019 [phone]    NO GROWTH 5 DAYS Performed at [facility], [place] [address], [place],  [address]  REPTSTATUS 08/24/2019 FINAL 08/19/2019 0714   REPTSTATUS 08/24/2019 FINAL 08/19/2019 0714    Cardiac Enzymes: No results for input(s): CKTOTAL, CKMB, CKMBINDEX, TROPONINI in the last 168 hours. CBG: Recent Labs  Lab 12/09/19 2140 12/10/19 1132 12/10/19 1624 12/10/19 2124 12/11/19 0740  GLUCAP 152* 119* 176* 263* 169*   Iron Studies:  Recent Labs    12/10/19 1216  IRON 99  TIBC 260  FERRITIN 128   Lab Results  Component Value Date   INR 1.2 08/25/2019   INR 1.2 04/28/2019   INR 1.17 09/23/2016   Studies/Results: No results found. Medications: . ferric gluconate (FERRLECIT/NULECIT) IV 62.5 mg (12/10/19 0944)   . allopurinol  100 mg Oral BID  . aspirin EC  325 mg Oral Daily  . brinzolamide  1 drop Both Eyes BID  . buPROPion  150 mg Oral BID  . Chlorhexidine Gluconate Cloth  6 each Topical Q0600  . darbepoetin (ARANESP)  injection - DIALYSIS  200 mcg Intravenous Q Sat-HD  . feeding supplement (PRO-STAT SUGAR FREE 64)  30 mL Oral BID  . furosemide  40 mg Oral BID  . heparin  5,000 Units Subcutaneous Q8H  . insulin aspart  0-5 Units Subcutaneous QHS  . insulin aspart  0-6 Units Subcutaneous TID WC  . latanoprost  1 drop Both Eyes QHS  . linaclotide  72 mcg Oral QAC breakfast  . loratadine  10 mg Oral Daily  . metoprolol tartrate  25 mg Oral BID  . mometasone-formoterol  2 puff Inhalation BID  . multivitamin  1 tablet Oral QHS  . pantoprazole  40 mg Oral Daily  . pravastatin  40 mg Oral Daily  . sucroferric oxyhydroxide  500 mg Oral TID WC

## 2019-12-11 NOTE — Progress Notes (Signed)
PROGRESS NOTE    [name] [name]  TIW:[id number] DOB: 1951-06-11 DOA: 12/07/2019 PCP: [facility]  Brief Narrative: morbidly obese 68 year old female with history of ESRD on hemodialysis, remote CVA with left hemiparesis, chronic systolic and diastolic CHF, anemia of chronic disease, gait disorder presented to the ED following yet another fall, she had missed hemodialysis as she was not able to walk down 2 flights of stairs the previous day, history of longstanding gait issues worse since September   Assessment & Plan:  Hyperkalemia from missed hemodialysis -Corrected with HD  Gait disorder, frequent falls -Patient reports that she suffered a stroke in the 70s, 80s which resulted in left leg weakness, she has had longstanding balance problems which have worsened since September 2020 resulting in numerous falls, missed hemodialysis etc. -Exacerbated by gout flare in left ankle and toes, this aspect is improving on colchicine and prednisone -No new neurological symptoms reported -PT OT evaluation completed, SNF recommended for rehabilitation -Awaiting SNF, now having second thoughts about this  Subacute left ankle and left great toe gout flare -Given prednisone x1 yesterday with considerable improvement -Will continue this for 2 more days, also given colchicine   ESRD on HD Tuesday Thursday Saturday -Nephrology consulting, dialyzed on Saturday  Morbid obesity  Anemia of chronic disease -Stable  H/o CVAstable at baseline -Continue aspirin and statin  Hypertensioncontrolled. Home meds include lasix, metoprolol, Cont Metoprolol 12.5mg  po bid -Still takes Lasix, makes small amounts of urine  Dm2controlled. HgA1c 5.2 3 months ago Cont Novolog -CBGs are stable may bump up with prednisone, monitor  Diabetic neuropathy -Resume gabapentin at renal dose  Constipation Cont Linzess  Cont Miralax  Gerd Cont PPI  Glaucoma Cont Azopt Cont  Xalatan  DVT prophylaxis: Heparin subcutaneous Code Status: Full code Family Communication: No family at bedside Disposition Plan: SNF versus home with home health tomorrow  Consultants:   Nephrology   Procedures:   Antimicrobials:    Subjective: -Feels like her pain in her left ankle and toe is greatly improved  Objective: Vitals:   12/10/19 2059 12/10/19 2228 12/11/19 0440 12/11/19 0755  BP: 108/64 135/78 (!) 133/58 (!) 105/57  Pulse: (!) 53 74 69 72  Resp: 18  (!) 22 18  Temp: 98.9 F (37.2 C)  98.5 F (36.9 C)   TempSrc: Oral  Oral   SpO2: 100%  97% 99%  Weight:   120.8 kg   Height:        Intake/Output Summary (Last 24 hours) at 12/11/2019 1204 Last data filed at 12/11/2019 0739 Gross per 24 hour  Intake 840 ml  Output 100 ml  Net 740 ml   Filed Weights   12/10/19 0718 12/10/19 1030 12/11/19 0440  Weight: 123.7 kg 121.1 kg 120.8 kg    Examination:  Gen: Obese pleasant female, AAOx3, no distress HEENT: PERRLA, Neck supple, no JVD Lungs: Clear CVS: RRR,No Gallops,Rubs or new Murmurs Abd: soft, Non tender, non distended, BS present Extremities: Mild left hemiparesis, left great toe, left ankle with improved tenderness and range of motion Skin: no new rashes Psychiatry: Judgement and insight appear normal. Mood & affect appropriate.     Data Reviewed:   CBC: Recent Labs  Lab 12/07/19 1543 12/08/19 0500 12/10/19 0753 12/11/19 0842  WBC 6.7 6.2 4.1 9.2  NEUTROABS 5.0  --   --   --   HGB 10.3* 10.0* 8.3* 8.5*  HCT 33.7* 33.3* 27.7* 27.0*  MCV 91.8 93.5 91.7 89.7  PLT 308  325 245 654   Basic Metabolic Panel: Recent Labs  Lab 12/07/19 0317 12/07/19 1543 12/08/19 0500 12/10/19 0341  NA 142 139 145 143  K 4.9 5.9* 4.9 4.2  CL 99 97* 103 103  CO2 24 25 24 25   GLUCOSE 151* 181* 115* 119*  BUN 99* 95* 99* 72*  CREATININE 9.83* 9.39* 10.03* 7.17*  CALCIUM 8.2* 8.2* 7.9* 7.7*   GFR: Estimated Creatinine Clearance: 10.3 mL/min (A) (by  C-G formula based on SCr of 7.17 mg/dL (H)). Liver Function Tests: Recent Labs  Lab 12/08/19 0500  AST 16  ALT 11  ALKPHOS 111  BILITOT 0.4  PROT 6.1*  ALBUMIN 2.9*   No results for input(s): LIPASE, AMYLASE in the last 168 hours. No results for input(s): AMMONIA in the last 168 hours. Coagulation Profile: No results for input(s): INR, PROTIME in the last 168 hours. Cardiac Enzymes: No results for input(s): CKTOTAL, CKMB, CKMBINDEX, TROPONINI in the last 168 hours. BNP (last 3 results) No results for input(s): PROBNP in the last 8760 hours. HbA1C: No results for input(s): HGBA1C in the last 72 hours. CBG: Recent Labs  Lab 12/10/19 1132 12/10/19 1624 12/10/19 2124 12/11/19 0740 12/11/19 1129  GLUCAP 119* 176* 263* 169* 104*   Lipid Profile: No results for input(s): CHOL, HDL, LDLCALC, TRIG, CHOLHDL, LDLDIRECT in the last 72 hours. Thyroid Function Tests: No results for input(s): TSH, T4TOTAL, FREET4, T3FREE, THYROIDAB in the last 72 hours. Anemia Panel: Recent Labs    12/10/19 1216  FERRITIN 128  TIBC 260  IRON 99   Urine analysis:    Component Value Date/Time   COLORURINE YELLOW 08/19/2019 0712   APPEARANCEUR HAZY (A) 08/19/2019 0712   LABSPEC 1.012 08/19/2019 0712   PHURINE 5.0 08/19/2019 0712   GLUCOSEU NEGATIVE 08/19/2019 0712   HGBUR NEGATIVE 08/19/2019 0712   BILIRUBINUR NEGATIVE 08/19/2019 0712   KETONESUR NEGATIVE 08/19/2019 0712   PROTEINUR NEGATIVE 08/19/2019 0712   UROBILINOGEN 0.2 05/12/2015 0938   NITRITE NEGATIVE 08/19/2019 0712   LEUKOCYTESUR SMALL (A) 08/19/2019 0712   Sepsis Labs: @LABRCNTIP (procalcitonin:4,lacticidven:4)  ) Recent Results (from the past 240 hour(s))  SARS CORONAVIRUS 2 (TAT 6-24 HRS) Nasopharyngeal Nasopharyngeal Swab     Status: None   Collection Time: 12/08/19  9:47 AM   Specimen: Nasopharyngeal Swab  Result Value Ref Range Status   SARS Coronavirus 2 NEGATIVE NEGATIVE Final    Comment: (NOTE) SARS-CoV-2 target  nucleic acids are NOT DETECTED. The SARS-CoV-2 RNA is generally detectable in upper and lower respiratory specimens during the acute phase of infection. Negative results do not preclude SARS-CoV-2 infection, do not rule out co-infections with other pathogens, and should not be used as the sole basis for treatment or other patient management decisions. Negative results must be combined with clinical observations, patient history, and epidemiological information. The expected result is Negative. Fact Sheet for Patients: SugarRoll.be Fact Sheet for Healthcare Providers: https://www.woods-mathews.com/ This test is not yet approved or cleared by the [place] FDA and  has been authorized for detection and/or diagnosis of SARS-CoV-2 by FDA under an Emergency Use Authorization (EUA). This EUA will remain  in effect (meaning this test can be used) for the duration of the COVID-19 declaration under Section 56 4(b)(1) of the Act, 21 U.S.C. section 360bbb-3(b)(1), unless the authorization is terminated or revoked sooner. Performed at [facility], [place] [address] [place], [place] [address]   MRSA PCR Screening     Status: None   Collection Time: 12/09/19 12:07  AM   Specimen: Nasal Mucosa; Nasopharyngeal  Result Value Ref Range Status   MRSA by PCR NEGATIVE NEGATIVE Final    Comment:        The GeneXpert MRSA Assay (FDA approved for NASAL specimens only), is one component of a comprehensive MRSA colonization surveillance program. It is not intended to diagnose MRSA infection nor to guide or monitor treatment for MRSA infections. Performed at [facility], [place] [address] [place], [place] [address]          Radiology Studies: No results found.      Scheduled Meds: . allopurinol  100 mg Oral BID  . aspirin EC  325 mg Oral Daily  . brinzolamide  1 drop Both Eyes BID  . buPROPion  150 mg Oral BID  . [START ON  12/13/2019] calcitRIOL  0.75 mcg Oral Q T,Th,Sa-HD  . Chlorhexidine Gluconate Cloth  6 each Topical Q0600  . darbepoetin (ARANESP) injection - DIALYSIS  200 mcg Intravenous Q Sat-HD  . feeding supplement (PRO-STAT SUGAR FREE 64)  30 mL Oral BID  . furosemide  40 mg Oral BID  . heparin  5,000 Units Subcutaneous Q8H  . insulin aspart  0-5 Units Subcutaneous QHS  . insulin aspart  0-6 Units Subcutaneous TID WC  . latanoprost  1 drop Both Eyes QHS  . linaclotide  72 mcg Oral QAC breakfast  . loratadine  10 mg Oral Daily  . metoprolol tartrate  25 mg Oral BID  . mometasone-formoterol  2 puff Inhalation BID  . multivitamin  1 tablet Oral QHS  . pantoprazole  40 mg Oral Daily  . pravastatin  40 mg Oral Daily  . predniSONE  40 mg Oral Q breakfast  . sucroferric oxyhydroxide  500 mg Oral TID WC   Continuous Infusions: . ferric gluconate (FERRLECIT/NULECIT) IV 62.5 mg (12/10/19 0944)     LOS: 2 days   Time spent: 16min  Domenic Polite, MD [facility]   12/11/2019, 12:04 PM

## 2019-12-12 LAB — CBC
HCT: 26.6 % — ABNORMAL LOW (ref 36.0–46.0)
Hemoglobin: 8.2 g/dL — ABNORMAL LOW (ref 12.0–15.0)
MCH: 28 pg (ref 26.0–34.0)
MCHC: 30.8 g/dL (ref 30.0–36.0)
MCV: 90.8 fL (ref 80.0–100.0)
Platelets: 265 10*3/uL (ref 150–400)
RBC: 2.93 MIL/uL — ABNORMAL LOW (ref 3.87–5.11)
RDW: 17.4 % — ABNORMAL HIGH (ref 11.5–15.5)
WBC: 8.8 10*3/uL (ref 4.0–10.5)
nRBC: 0 % (ref 0.0–0.2)

## 2019-12-12 LAB — GLUCOSE, CAPILLARY
Glucose-Capillary: 173 mg/dL — ABNORMAL HIGH (ref 70–99)
Glucose-Capillary: 134 mg/dL — ABNORMAL HIGH (ref 70–99)

## 2019-12-12 MED ORDER — PREDNISONE 20 MG PO TABS
30.0000 mg | ORAL_TABLET | Freq: Every day | ORAL | Status: AC
  Administered 2019-12-12: 30 mg via ORAL
  Filled 2019-12-12: qty 1

## 2019-12-12 MED ORDER — PREDNISONE 20 MG PO TABS: 10.0000 mg | ORAL_TABLET | Freq: Every day | ORAL | 0 refills | Status: DC

## 2019-12-12 MED ORDER — CHLORHEXIDINE GLUCONATE CLOTH 2 % EX PADS: 6.0000 | MEDICATED_PAD | Freq: Every day | CUTANEOUS | Status: DC

## 2019-12-12 MED ORDER — GABAPENTIN 300 MG PO CAPS: 300.0000 mg | ORAL_CAPSULE | Freq: Every day | ORAL | 0 refills | Status: DC

## 2019-12-12 NOTE — Progress Notes (Addendum)
[facility] Progress Note   Dialysis Orders: TTS at [facility] 4:15hr, 400/800, EDW 119.5kg, 2K/2Ca, TDC, no heparin - Mircera 200 q 2 weeks (last 11/25) - Calcitriol 0.24mcg PO q HD venofer 50/week -   Assessment/Plan: 1. Hyperkalemia: resolved with medication management and dialysis. 2. Weakness/ falls/gait disorder:Ongoing issue. SW and PT evaluating for plan.  3. ESRD:TTS -next HD Tuesday - not sure why not using AVF - placed in August -use next HD with 17 g needles- run 3.5 hours due to high inpatient dialysis census.  4. Hypertension/volume: BP coming down on HD net UF 2.4 L 12/12 - standing post wt 121.1 - still above EDW  5. Anemia:Hgb 10> 8.3 > 8.2 Why such a drop. Last tsat 14% in Nov - had a course of IV Fe- resume weekly Fe - stop oral Fe (due to resume at d/c ) - tsat 38% 12/12- resumed ESA 12/12 - hx of heme positive stools Aug and Oct 6. Metabolic bone disease:Corr Ca ok, Phos no checked -resumed calcitriol 7. CAD (Hx CABG) 8. PAD 9. Disp - looks like possible SNF placement though patient prefers not  [name], PA-C [facility] Beeper [phone] 12/12/2019,7:54 AM  LOS: 3 days    Subjective:   No c/o ate all breakfast.  Objective Vitals:   12/11/19 1926 12/11/19 2113 12/11/19 2210 12/12/19 0510  BP:  131/62 138/89 (!) 144/59  Pulse:  68 73 70  Resp:      Temp:  98.3 F (36.8 C)  98.1 F (36.7 C)  TempSrc:  Oral  Oral  SpO2: 100% 100%  97%  Weight:    122.8 kg  Height:       Physical Exam  General: NAD spirits good. On room air Heart: RRR Lungs: grossly clear Abdomen: obese soft NT Extremities: no sig LE edema  Dialysis Access:  Right IJ  TDC  Left upper AVF maturiing   Additional Objective Labs: Basic Metabolic Panel: Recent Labs  Lab 12/07/19 1543 12/08/19 0500 12/10/19 0341  NA 139 145 143  K 5.9* 4.9 4.2  CL 97* 103 103  CO2 25 24 25   GLUCOSE 181* 115* 119*  BUN 95* 99* 72*  CREATININE  9.39* 10.03* 7.17*  CALCIUM 8.2* 7.9* 7.7*   Liver Function Tests: Recent Labs  Lab 12/08/19 0500  AST 16  ALT 11  ALKPHOS 111  BILITOT 0.4  PROT 6.1*  ALBUMIN 2.9*   No results for input(s): LIPASE, AMYLASE in the last 168 hours. CBC: Recent Labs  Lab 12/07/19 1543 12/08/19 0500 12/10/19 0753 12/11/19 0842 12/12/19 0453  WBC 6.7 6.2 4.1 9.2 8.8  NEUTROABS 5.0  --   --   --   --   HGB 10.3* 10.0* 8.3* 8.5* 8.2*  HCT 33.7* 33.3* 27.7* 27.0* 26.6*  MCV 91.8 93.5 91.7 89.7 90.8  PLT 308 325 245 240 265   Blood Culture    Component Value Date/Time   SDES BLOOD LEFT HAND 08/19/2019 0714   SDES BLOOD LEFT HAND 08/19/2019 0714   SPECREQUEST  08/19/2019 0714    BOTTLES DRAWN AEROBIC ONLY Blood Culture adequate volume   SPECREQUEST  08/19/2019 0714    BOTTLES DRAWN AEROBIC ONLY Blood Culture adequate volume   CULT  08/19/2019 0714    NO GROWTH 5 DAYS Performed at [facility], [place] [address], [place],  [address]    CULT  08/19/2019 [id number]    NO GROWTH 5 DAYS Performed at [facility], [place] [address], [place], [place] [address]    REPTSTATUS 08/24/2019 FINAL 08/19/2019 0714   REPTSTATUS 08/24/2019 FINAL 08/19/2019 0714    Cardiac Enzymes: No results for input(s): CKTOTAL, CKMB, CKMBINDEX, TROPONINI in the last 168 hours. CBG: Recent Labs  Lab 12/11/19 0740 12/11/19 1129 12/11/19 1709 12/11/19 2115 12/12/19 0735  GLUCAP 169* 104* 256* 314* 173*   Iron Studies:  Recent Labs    12/10/19 1216  IRON 99  TIBC 260  FERRITIN 128   Lab Results  Component Value Date   INR 1.2 08/25/2019   INR 1.2 04/28/2019   INR 1.17 09/23/2016   Studies/Results: No results found. Medications: . ferric gluconate (FERRLECIT/NULECIT) IV 62.5 mg (12/10/19 0944)   . allopurinol  100 mg Oral BID  . aspirin EC  325 mg Oral Daily  . brinzolamide  1 drop Both Eyes BID  . buPROPion  150 mg Oral BID  . [START ON 12/13/2019] calcitRIOL  0.75 mcg Oral Q  T,Th,Sa-HD  . Chlorhexidine Gluconate Cloth  6 each Topical Q0600  . darbepoetin (ARANESP) injection - DIALYSIS  200 mcg Intravenous Q Sat-HD  . feeding supplement (PRO-STAT SUGAR FREE 64)  30 mL Oral BID  . furosemide  40 mg Oral BID  . heparin  5,000 Units Subcutaneous Q8H  . insulin aspart  0-5 Units Subcutaneous QHS  . insulin aspart  0-6 Units Subcutaneous TID WC  . latanoprost  1 drop Both Eyes QHS  . linaclotide  72 mcg Oral QAC breakfast  . loratadine  10 mg Oral Daily  . metoprolol tartrate  25 mg Oral BID  . mometasone-formoterol  2 puff Inhalation BID  . multivitamin  1 tablet Oral QHS  . pantoprazole  40 mg Oral Daily  . pravastatin  40 mg Oral Daily  . predniSONE  30 mg Oral Q breakfast  . sucroferric oxyhydroxide  500 mg Oral TID WC

## 2019-12-12 NOTE — TOC Initial Note (Addendum)
Transition of Care [facility]) - Initial/Assessment Note    Patient Details  Name: [name] [name] MRN: [id number] Date of Birth: 1951-11-06  Transition of Care [facility] CM/SW Contact:    Bethena Roys, RN Phone Number: 12/12/2019, 1:06 PM  Clinical Narrative:  Pt presented for Hyperkalemia. Patient has Hemodialysis TTS schedule. Patient states she uses [employer] for transportation. Prior to arrival from home with spouse and declines [facility] at this time. Patient wants to return home with home health services. Patient has used [facility] in the past and wants to re-use them at this time. Referral called to [facility] with [place]. The office is able to service the patient for Physical and Occupational Therapy. Start of Care to begin within 24-48 hours post transition home. Pt has Durable medical equipment cane and rolling walker in the home.  Per patient her sister will provide transportation home. No further needs from Case Manager at this time.                 CMA asked to schedule hospital follow up appointment for patient within 1 week.   Expected Discharge Plan: [place] Barriers to Discharge: No Barriers Identified   Patient Goals and CMS Choice Patient states their goals for this hospitalization and ongoing recovery are:: Plan to return home with [facility] CMS Medicare.gov Compare Post Acute Care list provided to:: Patient Choice offered to / list presented to : Patient  Expected Discharge Plan and Services Expected Discharge Plan: [place] In-house Referral: Clinical Social Work Discharge Planning Services: CM Consult Post Acute Care Choice: [place] arrangements for the past 2 months: [place] Expected Discharge Date: 12/12/19                HH Arranged: PT, OT HH Agency: [place] ([place]) Date [place]: 12/12/19 Time [place]:  [place] Representative spoke with at [place]: [place] Arrangements/Services Living arrangements for the past 2 months: [place] with:: Self, Spouse Patient language and need for interpreter reviewed:: Yes Do you feel safe going back to the place where you live?: Yes      Need for Family Participation in Patient Care: Yes (Comment) Care giver support system in place?: Yes (comment) Current home services: DME(Patient has Kasandra Knudsen and RW) Criminal Activity/Legal Involvement Pertinent to Current Situation/Hospitalization: No - Comment as needed  Activities of Daily Living Home Assistive Devices/Equipment: Cane (specify quad or straight) ADL Screening (condition at time of admission) Patient's cognitive ability adequate to safely complete daily activities?: Yes Is the patient deaf or have difficulty hearing?: No Does the patient have difficulty seeing, even when wearing glasses/contacts?: No Does the patient have difficulty concentrating, remembering, or making decisions?: No Patient able to express need for assistance with ADLs?: Yes Does the patient have difficulty dressing or bathing?: No Independently performs ADLs?: Yes (appropriate for developmental age) Does the patient have difficulty walking or climbing stairs?: Yes Weakness of Legs: Both Weakness of Arms/Hands: None  Permission Sought/Granted Permission sought to share information with : Family Supports, Chartered certified accountant granted to share information with : Yes, Verbal Permission Granted     Permission granted to share info w AGENCY: [facility]        Emotional Assessment Appearance:: Appears stated age Attitude/Demeanor/Rapport: Engaged Affect (typically observed): Accepting Orientation: : Oriented to Self, Oriented to Place, Oriented to  Time,  Oriented to Situation Alcohol / Substance Use: Not Applicable Psych Involvement: No (comment)  Admission diagnosis:   Leg weakness, bilateral [R29.898] Hyperkalemia [E87.5] Patient Active Problem List   Diagnosis Date Noted  . Recurrent falls 10/09/2019  . Generalized weakness 10/09/2019  . Unspecified atrial fibrillation ([place]) 10/08/2019  . Acute GI bleeding 10/05/2019  . Hyperkalemia   . Depression   . Esophageal stricture   . Diabetic gastroparesis ([place])   . Upper GI bleed 08/16/2019  . Hiatal hernia   . Iron deficiency anemia   . Microcytic anemia 02/05/2016  . Acute on chronic renal failure ([place])   . CAD in native artery   . Substance abuse ([place])   . Chronic combined systolic and diastolic CHF (congestive heart failure) ([place])   . ESRD (end stage renal disease) ([place])   . Obesity, Class III, BMI 40-49.9 (morbid obesity) ([place])   . Type 2 diabetes mellitus with chronic kidney disease on chronic dialysis, with long-term current use of insulin ([place]) 10/18/2014  . S/P CABG (coronary artery bypass graft) 09/21/2013  . Arthritis of right shoulder region 03/23/2013  . Tobacco abuse   . Coronary artery disease   . NSTEMI (non-ST elevated myocardial infarction) ([place]) 06/29/2012  . History of cocaine abuse ([place]) 06/29/2012  . Essential hypertension 06/29/2012  . Glaucoma   . Hyperlipidemia   . History of CVA (cerebrovascular accident)    PCP:  [facility] Pharmacy:   [facility] [address] [place] [address] Phone: [phone] Fax: [place], [place] - [address] [place] [address] Phone: [phone] Fax: [phone]  [facility] [place], [place] [address] [place] [address] Phone: [phone] Fax: [phone]     Social Determinants of Health (SDOH) Interventions    Readmission Risk Interventions Readmission Risk Prevention Plan 12/12/2019 10/08/2019 08/26/2019   Transportation Screening Complete - Complete  Medication Review (RN Care Manager) Complete - Complete  PCP or Specialist appointment within 3-5 days of discharge Complete - Complete  HRI or [place] - - Complete  SW Recovery Care/Counseling Consult - - Complete  [place] - - Not Applicable  Some recent data might be hidden

## 2019-12-12 NOTE — Discharge Summary (Signed)
Physician Discharge Summary  [name] [name] RFF:[id number] DOB: [birth year] DOA: 12/07/2019  PCP: [place] date: 12/07/2019 Discharge date: 12/12/2019  Time spent: 35 minutes  Recommendations for Outpatient Follow-up:  PCP in 1 week Dialysis tomorrow  Discharge Diagnoses:  Principal Problem:   Hyperkalemia Left ankle and foot gout flare CVA with mild residual left   Hyperlipidemia   History of CVA (cerebrovascular accident)   History of cocaine abuse ([place])   Essential hypertension   Coronary artery disease   Type 2 diabetes mellitus with chronic kidney disease on chronic dialysis, with long-term current use of insulin (HCC)   ESRD (end stage renal disease) ([place])   Discharge Condition: Stable  Diet recommendation: Renal, diabetic  Filed Weights   12/10/19 1030 12/11/19 0440 12/12/19 0510  Weight: 121.1 kg 120.8 kg 122.8 kg    History of present illness:  morbidly obese 68 year old female with history of ESRD on hemodialysis, remote CVA with left hemiparesis, chronic systolic and diastolic CHF, anemia of chronic disease, gait disorder presented to the ED following yet another fall, she had missed hemodialysis as she was not able to walk down 2 flights of stairs the previous day, history of longstanding gait issues worse since September  Hospital Course:  Hyperkalemia from missed hemodialysis -Corrected with HD  Gait disorder, frequent falls -Patient reports that she suffered a stroke in the 70s/80s which resulted in left leg weakness, she has had longstanding balance problems which have worsened since September 2020 with foot and ankle pain resulting in numerous falls, missed hemodialysis etc. -She was noted to have subacute synovitis great toe, multiple toes in her left foot and her left ankle suspected to be from gout, she has a prior history of gout, she is on allopurinol for this  -I treated her with 1 dose of colchicine and a tapering  dose of prednisone for few days, patient had remarkable improvement in pain and mobility, was able to ambulate in the room and halls yesterday and today  -She is not felt to require SNF for rehab any longer, physical therapy now recommends home health services, she is discharged home on prednisone taper for few more days with home health services   Subacute left ankle and left great toe gout flare -Given prednisone and colchicine with considerable improvement -Continue prednisone taper for 4 more days  ESRD on HD Tuesday Thursday Saturday -Nephrology consulting, dialyzed on Saturday  Morbid obesity  Anemia of chronic disease -Stable  H/o CVAstable at baseline -Continue aspirin and statin  Hypertensioncontrolled. Home meds include lasix, metoprolol, Cont Metoprolol 12.5mg  po bid -Stopped Lasix due to risk of hyperuricemia  Dm2controlled. HgA1c 5.2 3 months ago Cont Novolog -CBGs are stable may bump up with prednisone, monitor  Diabetic neuropathy -Resumed gabapentin at renal dose  Constipation Cont Linzess  Cont Miralax  Gerd Cont PPI  Glaucoma Cont Azopt Cont Xalatan  Discharge Exam: Vitals:   12/12/19 0510 12/12/19 0840  BP: (!) 144/59   Pulse: 70 70  Resp:  18  Temp: 98.1 F (36.7 C)   SpO2: 97% 100%    General: Awake alert oriented x3 Cardiovascular: S1-S2, regular rate rhythm Respiratory: Clear  Discharge Instructions   Discharge Instructions    Discharge instructions   Complete by: As directed    Renal Diabetic diet   Increase activity slowly   Complete by: As directed      Allergies as of 12/12/2019      Reactions  Naproxen Rash      Medication List    STOP taking these medications   furosemide 40 MG tablet Commonly known as: LASIX     TAKE these medications   allopurinol 100 MG tablet Commonly known as: ZYLOPRIM Take 100 mg by mouth 2 (two) times daily.   aspirin EC 325 MG tablet Take 325 mg by mouth daily.    brinzolamide 1 % ophthalmic suspension Commonly known as: AZOPT Place 1 drop into both eyes 2 (two) times a day.   buPROPion 150 MG 12 hr tablet Commonly known as: WELLBUTRIN SR Take 1 tablet (150 mg total) by mouth 2 (two) times daily.   cetirizine 10 MG tablet Commonly known as: ZYRTEC Take 10 mg by mouth daily as needed for allergies.   ferrous sulfate 325 (65 FE) MG tablet Take 325 mg by mouth 2 (two) times daily with a meal.   gabapentin 300 MG capsule Commonly known as: NEURONTIN Take 1 capsule (300 mg total) by mouth daily. What changed: when to take this   insulin aspart 100 UNIT/ML injection Commonly known as: novoLOG Inject 0-15 Units into the skin 3 (three) times daily with meals. What changed: how much to take   ipratropium 17 MCG/ACT inhaler Commonly known as: ATROVENT HFA Inhale 2 puffs into the lungs every 6 (six) hours as needed for wheezing.   latanoprost 0.005 % ophthalmic solution Commonly known as: XALATAN Place 1 drop into both eyes at bedtime.   Linzess 72 MCG capsule Generic drug: linaclotide Take 72 mcg by mouth daily before breakfast.   metoprolol tartrate 25 MG tablet Commonly known as: LOPRESSOR Take 0.5 tablets (12.5 mg total) by mouth 2 (two) times daily. What changed: how much to take   mometasone-formoterol 100-5 MCG/ACT Aero Commonly known as: DULERA Inhale 2 puffs into the lungs every 4 (four) hours as needed for wheezing.   nitroGLYCERIN 0.4 MG SL tablet Commonly known as: NITROSTAT Place 1 tablet (0.4 mg total) under the tongue every 5 (five) minutes as needed for chest pain.   pantoprazole 40 MG tablet Commonly known as: PROTONIX Take 1 tablet (40 mg total) by mouth daily.   polyethylene glycol 17 g packet Commonly known as: MIRALAX / GLYCOLAX Take 17 g by mouth daily as needed for mild constipation.   pravastatin 40 MG tablet Commonly known as: PRAVACHOL Take 40 mg by mouth daily.   predniSONE 20 MG tablet Commonly  known as: DELTASONE Take 0.5-1 tablets (10-20 mg total) by mouth daily. Take 20mg  daily for 2days then 10mg  daily for 3days then STOP   ProAir HFA 108 (90 Base) MCG/ACT inhaler Generic drug: albuterol Inhale 2 puffs into the lungs every 6 (six) hours as needed for wheezing.   Velphoro 500 MG chewable tablet Generic drug: sucroferric oxyhydroxide Chew 500 mg by mouth 3 (three) times daily with meals.   Wixela Inhub 250-50 MCG/DOSE Aepb Generic drug: Fluticasone-Salmeterol Inhale 2 puffs into the lungs daily.      Allergies  Allergen Reactions  . Naproxen Rash   Follow-up Information    [facility], [place] on 12/15/2019.   Why: @2 :30 Contact information: [place] [address] [phone]        [place], Peter M, MD .   Specialty: Cardiology Contact information: [address] [place] [address] [place] Follow up.   Specialty: Home Health Services Why: Physical and Occupational  Therapies -[place] to call you with visit time. If you need to call the office please call [phone]           The results of significant diagnostics from this hospitalization (including imaging, microbiology, ancillary and laboratory) are listed below for reference.    Significant Diagnostic Studies: No results found.  Microbiology: Recent Results (from the past 240 hour(s))  SARS CORONAVIRUS 2 (TAT 6-24 HRS) Nasopharyngeal Nasopharyngeal Swab     Status: None   Collection Time: 12/08/19  9:47 AM   Specimen: Nasopharyngeal Swab  Result Value Ref Range Status   SARS Coronavirus 2 NEGATIVE NEGATIVE Final    Comment: (NOTE) SARS-CoV-2 target nucleic acids are NOT DETECTED. The SARS-CoV-2 RNA is generally detectable in upper and lower respiratory specimens during the acute phase of infection. Negative results do not preclude SARS-CoV-2 infection, do not rule out co-infections with  other pathogens, and should not be used as the sole basis for treatment or other patient management decisions. Negative results must be combined with clinical observations, patient history, and epidemiological information. The expected result is Negative. Fact Sheet for Patients: SugarRoll.be Fact Sheet for Healthcare Providers: https://www.woods-mathews.com/ This test is not yet approved or cleared by the [place] FDA and  has been authorized for detection and/or diagnosis of SARS-CoV-2 by FDA under an Emergency Use Authorization (EUA). This EUA will remain  in effect (meaning this test can be used) for the duration of the COVID-19 declaration under Section 56 4(b)(1) of the Act, 21 U.S.C. section 360bbb-3(b)(1), unless the authorization is terminated or revoked sooner. Performed at [facility], [place] [address] [place], [place] [address]   MRSA PCR Screening     Status: None   Collection Time: 12/09/19 12:07 AM   Specimen: Nasal Mucosa; Nasopharyngeal  Result Value Ref Range Status   MRSA by PCR NEGATIVE NEGATIVE Final    Comment:        The GeneXpert MRSA Assay (FDA approved for NASAL specimens only), is one component of a comprehensive MRSA colonization surveillance program. It is not intended to diagnose MRSA infection nor to guide or monitor treatment for MRSA infections. Performed at [facility], [place] [address] [place], [place] [address]      Labs: Basic Metabolic Panel: Recent Labs  Lab 12/07/19 0317 12/07/19 1543 12/08/19 0500 12/10/19 0341  NA 142 139 145 143  K 4.9 5.9* 4.9 4.2  CL 99 97* 103 103  CO2 24 25 24 25   GLUCOSE 151* 181* 115* 119*  BUN 99* 95* 99* 72*  CREATININE 9.83* 9.39* 10.03* 7.17*  CALCIUM 8.2* 8.2* 7.9* 7.7*   Liver Function Tests: Recent Labs  Lab 12/08/19 0500  AST 16  ALT 11  ALKPHOS 111  BILITOT 0.4  PROT 6.1*  ALBUMIN 2.9*   No results for input(s): LIPASE,  AMYLASE in the last 168 hours. No results for input(s): AMMONIA in the last 168 hours. CBC: Recent Labs  Lab 12/07/19 1543 12/08/19 0500 12/10/19 0753 12/11/19 0842 12/12/19 0453  WBC 6.7 6.2 4.1 9.2 8.8  NEUTROABS 5.0  --   --   --   --   HGB 10.3* 10.0* 8.3* 8.5* 8.2*  HCT 33.7* 33.3* 27.7* 27.0* 26.6*  MCV 91.8 93.5 91.7 89.7 90.8  PLT 308 325 245 240 265   Cardiac Enzymes: No results for input(s): CKTOTAL, CKMB, CKMBINDEX, TROPONINI in the last 168 hours. BNP: BNP (last 3 results) Recent Labs    04/27/19 1942 06/01/19 2322  08/16/19 0934  BNP 619.8* 331.8* 250.5*    ProBNP (last 3 results) No results for input(s): PROBNP in the last 8760 hours.  CBG: Recent Labs  Lab 12/11/19 1129 12/11/19 1709 12/11/19 2115 12/12/19 0735 12/12/19 1126  GLUCAP 104* 256* 314* 173* 134*       Signed:  Domenic Polite MD.  [facility] 12/12/2019, 3:11 PM

## 2019-12-12 NOTE — Progress Notes (Signed)
Physical Therapy Treatment Patient Details Name: [name] [name] MRN: [id number] DOB: [birth year] Today's Date: 12/12/2019    History of Present Illness Patient is a 68 y/o female admitted with LE weakness and increasing falls at home.  PMH to include DM, CAD s/p CABG, CKD, HF, CVA, ESRD on HD    PT Comments    Patient received sitting at EOB, very pleasant and energetic today, states "this is the best I've felt for awhile!". Able to complete functional transfers with S and cues for safety, able to progress gait significantly today but does remain easily fatigued. Did not need any rest breaks during gait. She was left sitting at EOB with all needs met and questions/concerns addressed. Updated DC plan as patient is now going home and would definitely benefit from skilled HHPT services.     Follow Up Recommendations  Home health PT;Supervision for mobility/OOB     Equipment Recommendations  Rolling walker with 5" wheels    Recommendations for Other Services       Precautions / Restrictions Precautions Precautions: Fall Precaution Comments: multiple falls at home, reports about 8 in last 2-3 weeks Restrictions Weight Bearing Restrictions: No    Mobility  Bed Mobility               General bed mobility comments: sitting at EOB upon entry  Transfers Overall transfer level: Needs assistance Equipment used: Rolling walker (2 wheeled) Transfers: Sit to/from Stand Sit to Stand: Supervision         General transfer comment: S for safety, no physical assist given did need cues for safety like getting proper footwear on  Ambulation/Gait Ambulation/Gait assistance: Min guard Gait Distance (Feet): 120 Feet Assistive device: Rolling walker (2 wheeled) Gait Pattern/deviations: Step-to pattern;Step-through pattern;Decreased stride length;Trunk flexed Gait velocity: decreased   General Gait Details: gait pattern and tolerance much improved today, steady and safe with  RW   Stairs             Wheelchair Mobility    Modified Rankin (Stroke Patients Only)       Balance Overall balance assessment: Needs assistance Sitting-balance support: Feet supported Sitting balance-Leahy Scale: Good     Standing balance support: Bilateral upper extremity supported;During functional activity Standing balance-Leahy Scale: Fair Standing balance comment: benefits from B UE support                            Cognition Arousal/Alertness: Awake/alert Behavior During Therapy: WFL for tasks assessed/performed Overall Cognitive Status: Within Functional Limits for tasks assessed                                 General Comments: very energetic and pleasant today      Exercises      General Comments General comments (skin integrity, edema, etc.): patient feeling much better, both her and her spouse now want her to go home      Pertinent Vitals/Pain Pain Assessment: No/denies pain Pain Score: 0-No pain Pain Intervention(s): Limited activity within patient's tolerance;Monitored during session    Home Living                      Prior Function            PT Goals (current goals can now be found in the care plan section) Acute Rehab PT Goals Patient Stated Goal: to go  to rehab prior to home PT Goal Formulation: With patient Time For Goal Achievement: 12/21/19 Potential to Achieve Goals: Good Progress towards PT goals: Progressing toward goals    Frequency    Min 3X/week      PT Plan Discharge plan needs to be updated    Co-evaluation              AM-PAC PT "6 Clicks" Mobility   Outcome Measure  Help needed turning from your back to your side while in a flat bed without using bedrails?: None Help needed moving from lying on your back to sitting on the side of a flat bed without using bedrails?: None Help needed moving to and from a bed to a chair (including a wheelchair)?: A Little Help needed  standing up from a chair using your arms (e.g., wheelchair or bedside chair)?: None Help needed to walk in hospital room?: A Little Help needed climbing 3-5 steps with a railing? : A Little 6 Click Score: 21    End of Session   Activity Tolerance: Patient tolerated treatment well Patient left: in bed;with call bell/phone within reach   PT Visit Diagnosis: Other abnormalities of gait and mobility (R26.89);Difficulty in walking, not elsewhere classified (R26.2)     Time: 0271-4232 PT Time Calculation (min) (ACUTE ONLY): 13 min  Charges:  $Gait Training: 8-22 mins                     Windell Norfolk, DPT, PN1   Supplemental Physical Therapist [place]    Pager [phone] Acute Rehab Office [phone]

## 2019-12-14 ENCOUNTER — Telehealth: Payer: Self-pay | Admitting: Nephrology

## 2019-12-14 NOTE — Telephone Encounter (Signed)
Transition of care contact from inpatient facility  Date of discharge: 12/12/2019    Date of contact: 12/14/2019 Method: Phone Spoke to: Patient  Patient contacted to discuss transition of care from recent inpatient hospitalization. Patient was admitted to [facility] from 12/9/2  to 12/12/2019 with discharge diagnosis of hyperkalemia and frequent falls, found to have synovitis/?gout of great toe - improved with colchicine and prednisone.  Medication changes were reviewed.  She reports doing ok with ADLs and walking at home without issues. She went for HD yesterday - no issues. No concerns about medications. Plan is for home health for PT  Patient will follow up with his/her outpatient HD unit on: 12/17 - and usual TTS schedule  Other f/u needs include: none at this time, home PT arranged.  Veneta Penton, PA-C [employer] Pager [phone]

## 2019-12-16 ENCOUNTER — Encounter (HOSPITAL_COMMUNITY): Payer: Medicare Other

## 2019-12-29 ENCOUNTER — Other Ambulatory Visit (HOSPITAL_COMMUNITY): Payer: Self-pay | Admitting: Nephrology

## 2019-12-29 DIAGNOSIS — N186 End stage renal disease: Secondary | ICD-10-CM

## 2020-01-01 ENCOUNTER — Emergency Department (HOSPITAL_COMMUNITY): Payer: Medicare (Managed Care)

## 2020-01-01 ENCOUNTER — Encounter (HOSPITAL_COMMUNITY): Payer: Self-pay | Admitting: Emergency Medicine

## 2020-01-01 ENCOUNTER — Observation Stay (HOSPITAL_COMMUNITY)
Admission: EM | Admit: 2020-01-01 | Discharge: 2020-01-02 | Disposition: A | Discharge status: Home / Self Care | Payer: Medicare (Managed Care) | Attending: Internal Medicine | Admitting: Internal Medicine

## 2020-01-01 ENCOUNTER — Other Ambulatory Visit: Payer: Self-pay

## 2020-01-01 DIAGNOSIS — E872 Acidosis: Secondary | ICD-10-CM | POA: Insufficient documentation

## 2020-01-01 DIAGNOSIS — N186 End stage renal disease: Secondary | ICD-10-CM | POA: Insufficient documentation

## 2020-01-01 DIAGNOSIS — I5042 Chronic combined systolic (congestive) and diastolic (congestive) heart failure: Secondary | ICD-10-CM | POA: Diagnosis not present

## 2020-01-01 DIAGNOSIS — Z7982 Long term (current) use of aspirin: Secondary | ICD-10-CM | POA: Insufficient documentation

## 2020-01-01 DIAGNOSIS — Z951 Presence of aortocoronary bypass graft: Secondary | ICD-10-CM | POA: Insufficient documentation

## 2020-01-01 DIAGNOSIS — I252 Old myocardial infarction: Secondary | ICD-10-CM | POA: Insufficient documentation

## 2020-01-01 DIAGNOSIS — M109 Gout, unspecified: Secondary | ICD-10-CM | POA: Insufficient documentation

## 2020-01-01 DIAGNOSIS — Z992 Dependence on renal dialysis: Secondary | ICD-10-CM | POA: Insufficient documentation

## 2020-01-01 DIAGNOSIS — I10 Essential (primary) hypertension: Secondary | ICD-10-CM | POA: Diagnosis present

## 2020-01-01 DIAGNOSIS — Z794 Long term (current) use of insulin: Secondary | ICD-10-CM | POA: Insufficient documentation

## 2020-01-01 DIAGNOSIS — E1151 Type 2 diabetes mellitus with diabetic peripheral angiopathy without gangrene: Secondary | ICD-10-CM | POA: Diagnosis not present

## 2020-01-01 DIAGNOSIS — F17210 Nicotine dependence, cigarettes, uncomplicated: Secondary | ICD-10-CM | POA: Insufficient documentation

## 2020-01-01 DIAGNOSIS — R7989 Other specified abnormal findings of blood chemistry: Secondary | ICD-10-CM | POA: Diagnosis not present

## 2020-01-01 DIAGNOSIS — J449 Chronic obstructive pulmonary disease, unspecified: Secondary | ICD-10-CM | POA: Diagnosis not present

## 2020-01-01 DIAGNOSIS — Z8673 Personal history of transient ischemic attack (TIA), and cerebral infarction without residual deficits: Secondary | ICD-10-CM | POA: Insufficient documentation

## 2020-01-01 DIAGNOSIS — Z23 Encounter for immunization: Secondary | ICD-10-CM | POA: Diagnosis not present

## 2020-01-01 DIAGNOSIS — J9601 Acute respiratory failure with hypoxia: Secondary | ICD-10-CM | POA: Diagnosis not present

## 2020-01-01 DIAGNOSIS — J810 Acute pulmonary edema: Secondary | ICD-10-CM

## 2020-01-01 DIAGNOSIS — F1410 Cocaine abuse, uncomplicated: Secondary | ICD-10-CM | POA: Diagnosis not present

## 2020-01-01 DIAGNOSIS — G9341 Metabolic encephalopathy: Secondary | ICD-10-CM | POA: Insufficient documentation

## 2020-01-01 DIAGNOSIS — I2510 Atherosclerotic heart disease of native coronary artery without angina pectoris: Secondary | ICD-10-CM | POA: Diagnosis not present

## 2020-01-01 DIAGNOSIS — D509 Iron deficiency anemia, unspecified: Secondary | ICD-10-CM | POA: Insufficient documentation

## 2020-01-01 DIAGNOSIS — J9801 Acute bronchospasm: Secondary | ICD-10-CM | POA: Diagnosis present

## 2020-01-01 DIAGNOSIS — R4182 Altered mental status, unspecified: Secondary | ICD-10-CM

## 2020-01-01 DIAGNOSIS — E1122 Type 2 diabetes mellitus with diabetic chronic kidney disease: Secondary | ICD-10-CM | POA: Diagnosis not present

## 2020-01-01 DIAGNOSIS — H409 Unspecified glaucoma: Secondary | ICD-10-CM | POA: Insufficient documentation

## 2020-01-01 DIAGNOSIS — Z20822 Contact with and (suspected) exposure to covid-19: Secondary | ICD-10-CM | POA: Insufficient documentation

## 2020-01-01 DIAGNOSIS — R918 Other nonspecific abnormal finding of lung field: Secondary | ICD-10-CM | POA: Insufficient documentation

## 2020-01-01 DIAGNOSIS — E1140 Type 2 diabetes mellitus with diabetic neuropathy, unspecified: Secondary | ICD-10-CM | POA: Insufficient documentation

## 2020-01-01 DIAGNOSIS — R778 Other specified abnormalities of plasma proteins: Secondary | ICD-10-CM | POA: Diagnosis present

## 2020-01-01 DIAGNOSIS — I132 Hypertensive heart and chronic kidney disease with heart failure and with stage 5 chronic kidney disease, or end stage renal disease: Secondary | ICD-10-CM | POA: Diagnosis not present

## 2020-01-01 DIAGNOSIS — E785 Hyperlipidemia, unspecified: Secondary | ICD-10-CM | POA: Insufficient documentation

## 2020-01-01 DIAGNOSIS — N2581 Secondary hyperparathyroidism of renal origin: Secondary | ICD-10-CM | POA: Insufficient documentation

## 2020-01-01 DIAGNOSIS — D631 Anemia in chronic kidney disease: Secondary | ICD-10-CM | POA: Insufficient documentation

## 2020-01-01 DIAGNOSIS — Z79899 Other long term (current) drug therapy: Secondary | ICD-10-CM | POA: Insufficient documentation

## 2020-01-01 DIAGNOSIS — G9340 Encephalopathy, unspecified: Secondary | ICD-10-CM

## 2020-01-01 DIAGNOSIS — Z7951 Long term (current) use of inhaled steroids: Secondary | ICD-10-CM | POA: Insufficient documentation

## 2020-01-01 DIAGNOSIS — Z8249 Family history of ischemic heart disease and other diseases of the circulatory system: Secondary | ICD-10-CM | POA: Insufficient documentation

## 2020-01-01 HISTORY — DX: Altered mental status, unspecified: R41.82

## 2020-01-01 LAB — BASIC METABOLIC PANEL
Anion gap: 13 (ref 5–15)
BUN: 68 mg/dL — ABNORMAL HIGH (ref 8–23)
CO2: 18 mmol/L — ABNORMAL LOW (ref 22–32)
Calcium: 7.5 mg/dL — ABNORMAL LOW (ref 8.9–10.3)
Chloride: 109 mmol/L (ref 98–111)
Creatinine, Ser: 7.57 mg/dL — ABNORMAL HIGH (ref 0.44–1.00)
GFR calc Af Amer: 6 mL/min — ABNORMAL LOW (ref >60)
GFR calc non Af Amer: 5 mL/min — ABNORMAL LOW (ref >60)
Glucose, Bld: 308 mg/dL — ABNORMAL HIGH (ref 70–99)
Potassium: 4.9 mmol/L (ref 3.5–5.1)
Sodium: 140 mmol/L (ref 135–145)

## 2020-01-01 LAB — CBC WITH DIFFERENTIAL/PLATELET
Abs Immature Granulocytes: 0.05 10*3/uL (ref 0.00–0.07)
Basophils Absolute: 0.1 10*3/uL (ref 0.0–0.1)
Basophils Relative: 1 %
Eosinophils Absolute: 0 10*3/uL (ref 0.0–0.5)
Eosinophils Relative: 0 %
HCT: 29.5 % — ABNORMAL LOW (ref 36.0–46.0)
Hemoglobin: 8.5 g/dL — ABNORMAL LOW (ref 12.0–15.0)
Immature Granulocytes: 1 %
Lymphocytes Relative: 5 %
Lymphs Abs: 0.5 10*3/uL — ABNORMAL LOW (ref 0.7–4.0)
MCH: 26 pg (ref 26.0–34.0)
MCHC: 28.8 g/dL — ABNORMAL LOW (ref 30.0–36.0)
MCV: 90.2 fL (ref 80.0–100.0)
Monocytes Absolute: 0.6 10*3/uL (ref 0.1–1.0)
Monocytes Relative: 5 %
Neutro Abs: 9.5 10*3/uL — ABNORMAL HIGH (ref 1.7–7.7)
Neutrophils Relative %: 88 %
Platelets: 350 10*3/uL (ref 150–400)
RBC: 3.27 MIL/uL — ABNORMAL LOW (ref 3.87–5.11)
RDW: 19.3 % — ABNORMAL HIGH (ref 11.5–15.5)
WBC: 10.6 10*3/uL — ABNORMAL HIGH (ref 4.0–10.5)
nRBC: 0.5 % — ABNORMAL HIGH (ref 0.0–0.2)

## 2020-01-01 LAB — TROPONIN I (HIGH SENSITIVITY)
Troponin I (High Sensitivity): 71 ng/L — ABNORMAL HIGH (ref <18)
Troponin I (High Sensitivity): 109 ng/L (ref <18)
Troponin I (High Sensitivity): 117 ng/L (ref <18)
Troponin I (High Sensitivity): 105 ng/L (ref <18)

## 2020-01-01 LAB — POCT I-STAT 7, (LYTES, BLD GAS, ICA,H+H)
Acid-base deficit: 6 mmol/L — ABNORMAL HIGH (ref 0.0–2.0)
Bicarbonate: 21 mmol/L (ref 20.0–28.0)
Calcium, Ion: 1.05 mmol/L — ABNORMAL LOW (ref 1.15–1.40)
HCT: 30 % — ABNORMAL LOW (ref 36.0–46.0)
Hemoglobin: 10.2 g/dL — ABNORMAL LOW (ref 12.0–15.0)
O2 Saturation: 51 %
Patient temperature: 98.1
Potassium: 4.8 mmol/L (ref 3.5–5.1)
Sodium: 142 mmol/L (ref 135–145)
TCO2: 22 mmol/L (ref 22–32)
pCO2 arterial: 46.4 mmHg (ref 32.0–48.0)
pH, Arterial: 7.262 — ABNORMAL LOW (ref 7.350–7.450)
pO2, Arterial: 31 mmHg — CL (ref 83.0–108.0)

## 2020-01-01 LAB — CBC
HCT: 28.6 % — ABNORMAL LOW (ref 36.0–46.0)
Hemoglobin: 8.2 g/dL — ABNORMAL LOW (ref 12.0–15.0)
MCH: 25.5 pg — ABNORMAL LOW (ref 26.0–34.0)
MCHC: 28.7 g/dL — ABNORMAL LOW (ref 30.0–36.0)
MCV: 89.1 fL (ref 80.0–100.0)
Platelets: 355 10*3/uL (ref 150–400)
RBC: 3.21 MIL/uL — ABNORMAL LOW (ref 3.87–5.11)
RDW: 19.4 % — ABNORMAL HIGH (ref 11.5–15.5)
WBC: 8.7 10*3/uL (ref 4.0–10.5)
nRBC: 0.2 % (ref 0.0–0.2)

## 2020-01-01 LAB — RESPIRATORY PANEL BY RT PCR (FLU A&B, COVID)
Influenza A by PCR: NEGATIVE
Influenza B by PCR: NEGATIVE
SARS Coronavirus 2 by RT PCR: NEGATIVE

## 2020-01-01 LAB — HEPATITIS B SURFACE ANTIGEN: Hepatitis B Surface Ag: NONREACTIVE

## 2020-01-01 LAB — GLUCOSE, CAPILLARY: Glucose-Capillary: 246 mg/dL — ABNORMAL HIGH (ref 70–99)

## 2020-01-01 LAB — BRAIN NATRIURETIC PEPTIDE: B Natriuretic Peptide: 897.8 pg/mL — ABNORMAL HIGH (ref 0.0–100.0)

## 2020-01-01 MED ORDER — SODIUM CHLORIDE 0.9 % IV SOLN: 100.0000 mL | INTRAVENOUS | Status: DC | PRN

## 2020-01-01 MED ORDER — LIDOCAINE HCL (PF) 1 % IJ SOLN: 5.0000 mL | INTRAMUSCULAR | Status: DC | PRN

## 2020-01-01 MED ORDER — CHLORHEXIDINE GLUCONATE CLOTH 2 % EX PADS
6.0000 | MEDICATED_PAD | Freq: Every day | CUTANEOUS | Status: DC
  Administered 2020-01-02: 6 via TOPICAL

## 2020-01-01 MED ORDER — CALCITRIOL 0.25 MCG PO CAPS: 1.5000 ug | ORAL_CAPSULE | ORAL | Status: DC

## 2020-01-01 MED ORDER — LIDOCAINE-PRILOCAINE 2.5-2.5 % EX CREA: 1.0000 "application " | TOPICAL_CREAM | CUTANEOUS | Status: DC | PRN

## 2020-01-01 MED ORDER — ALBUTEROL SULFATE HFA 108 (90 BASE) MCG/ACT IN AERS
4.0000 | INHALATION_SPRAY | Freq: Once | RESPIRATORY_TRACT | Status: AC
  Administered 2020-01-01: 4 via RESPIRATORY_TRACT
  Filled 2020-01-01: qty 6.7

## 2020-01-01 MED ORDER — ACETAMINOPHEN 650 MG RE SUPP: 650.0000 mg | Freq: Four times a day (QID) | RECTAL | Status: DC | PRN

## 2020-01-01 MED ORDER — ALBUTEROL SULFATE (2.5 MG/3ML) 0.083% IN NEBU: 2.5000 mg | INHALATION_SOLUTION | Freq: Four times a day (QID) | RESPIRATORY_TRACT | Status: DC | PRN

## 2020-01-01 MED ORDER — BRINZOLAMIDE 1 % OP SUSP
1.0000 [drp] | Freq: Three times a day (TID) | OPHTHALMIC | Status: DC
  Filled 2020-01-01: qty 10

## 2020-01-01 MED ORDER — GABAPENTIN 300 MG PO CAPS
300.0000 mg | ORAL_CAPSULE | Freq: Every day | ORAL | Status: DC
  Administered 2020-01-02: 300 mg via ORAL
  Filled 2020-01-01: qty 1

## 2020-01-01 MED ORDER — DOXERCALCIFEROL 4 MCG/2ML IV SOLN
4.0000 ug | Freq: Once | INTRAVENOUS | Status: DC
  Filled 2020-01-01: qty 2

## 2020-01-01 MED ORDER — ALLOPURINOL 100 MG PO TABS
100.0000 mg | ORAL_TABLET | Freq: Two times a day (BID) | ORAL | Status: DC
  Administered 2020-01-01 – 2020-01-02 (×2): 100 mg via ORAL
  Filled 2020-01-01 (×3): qty 1

## 2020-01-01 MED ORDER — ALBUTEROL SULFATE HFA 108 (90 BASE) MCG/ACT IN AERS
8.0000 | INHALATION_SPRAY | Freq: Once | RESPIRATORY_TRACT | Status: DC
  Filled 2020-01-01: qty 6.7

## 2020-01-01 MED ORDER — PREDNISONE 20 MG PO TABS: 60.0000 mg | ORAL_TABLET | Freq: Every day | ORAL | 0 refills | Status: DC

## 2020-01-01 MED ORDER — LORATADINE 10 MG PO TABS
10.0000 mg | ORAL_TABLET | Freq: Every day | ORAL | Status: DC
  Administered 2020-01-02: 10 mg via ORAL
  Filled 2020-01-01: qty 1

## 2020-01-01 MED ORDER — POLYETHYLENE GLYCOL 3350 17 G PO PACK: 17.0000 g | PACK | Freq: Every day | ORAL | Status: DC | PRN

## 2020-01-01 MED ORDER — LINACLOTIDE 72 MCG PO CAPS
72.0000 ug | ORAL_CAPSULE | Freq: Every day | ORAL | Status: DC
  Administered 2020-01-02: 72 ug via ORAL
  Filled 2020-01-01: qty 1

## 2020-01-01 MED ORDER — ALTEPLASE 2 MG IJ SOLR: 2.0000 mg | Freq: Once | INTRAMUSCULAR | Status: DC | PRN

## 2020-01-01 MED ORDER — PRAVASTATIN SODIUM 40 MG PO TABS
40.0000 mg | ORAL_TABLET | Freq: Every day | ORAL | Status: DC
  Administered 2020-01-02: 40 mg via ORAL
  Filled 2020-01-01: qty 1

## 2020-01-01 MED ORDER — INFLUENZA VAC A&B SA ADJ QUAD 0.5 ML IM PRSY
0.5000 mL | PREFILLED_SYRINGE | INTRAMUSCULAR | Status: AC
  Administered 2020-01-02: 0.5 mL via INTRAMUSCULAR
  Filled 2020-01-01: qty 0.5

## 2020-01-01 MED ORDER — PANTOPRAZOLE SODIUM 40 MG PO TBEC
40.0000 mg | DELAYED_RELEASE_TABLET | Freq: Every day | ORAL | Status: DC
  Administered 2020-01-01 – 2020-01-02 (×2): 40 mg via ORAL
  Filled 2020-01-01 (×2): qty 1

## 2020-01-01 MED ORDER — METOPROLOL TARTRATE 12.5 MG HALF TABLET
12.5000 mg | ORAL_TABLET | Freq: Two times a day (BID) | ORAL | Status: DC
  Administered 2020-01-01 – 2020-01-02 (×2): 12.5 mg via ORAL
  Filled 2020-01-01 (×2): qty 1

## 2020-01-01 MED ORDER — SUCROFERRIC OXYHYDROXIDE 500 MG PO CHEW
500.0000 mg | CHEWABLE_TABLET | Freq: Three times a day (TID) | ORAL | Status: DC
  Administered 2020-01-02: 500 mg via ORAL
  Filled 2020-01-01 (×4): qty 1

## 2020-01-01 MED ORDER — HEPARIN SODIUM (PORCINE) 1000 UNIT/ML DIALYSIS: 1000.0000 [IU] | INTRAMUSCULAR | Status: DC | PRN

## 2020-01-01 MED ORDER — ASPIRIN EC 325 MG PO TBEC
325.0000 mg | DELAYED_RELEASE_TABLET | Freq: Every day | ORAL | Status: DC
  Administered 2020-01-02: 325 mg via ORAL
  Filled 2020-01-01: qty 1

## 2020-01-01 MED ORDER — IPRATROPIUM BROMIDE 0.02 % IN SOLN: 0.5000 mg | Freq: Four times a day (QID) | RESPIRATORY_TRACT | Status: DC | PRN

## 2020-01-01 MED ORDER — IPRATROPIUM BROMIDE HFA 17 MCG/ACT IN AERS
2.0000 | INHALATION_SPRAY | Freq: Once | RESPIRATORY_TRACT | Status: DC
  Filled 2020-01-01: qty 12.9

## 2020-01-01 MED ORDER — MOMETASONE FURO-FORMOTEROL FUM 100-5 MCG/ACT IN AERO
2.0000 | INHALATION_SPRAY | Freq: Two times a day (BID) | RESPIRATORY_TRACT | Status: DC
  Filled 2020-01-01: qty 8.8

## 2020-01-01 MED ORDER — PENTAFLUOROPROP-TETRAFLUOROETH EX AERO: 1.0000 "application " | INHALATION_SPRAY | CUTANEOUS | Status: DC | PRN

## 2020-01-01 MED ORDER — BUPROPION HCL ER (SR) 150 MG PO TB12
150.0000 mg | ORAL_TABLET | Freq: Two times a day (BID) | ORAL | Status: DC
  Administered 2020-01-01 – 2020-01-02 (×2): 150 mg via ORAL
  Filled 2020-01-01 (×3): qty 1

## 2020-01-01 MED ORDER — HEPARIN SODIUM (PORCINE) 1000 UNIT/ML IJ SOLN
INTRAMUSCULAR | Status: AC
  Administered 2020-01-01: 1000 [IU] via INTRAVENOUS_CENTRAL
  Filled 2020-01-01: qty 4

## 2020-01-01 MED ORDER — SODIUM BICARBONATE 650 MG PO TABS
650.0000 mg | ORAL_TABLET | Freq: Three times a day (TID) | ORAL | Status: DC
  Administered 2020-01-01 – 2020-01-02 (×3): 650 mg via ORAL
  Filled 2020-01-01 (×4): qty 1

## 2020-01-01 MED ORDER — LATANOPROST 0.005 % OP SOLN
1.0000 [drp] | Freq: Every day | OPHTHALMIC | Status: DC
  Filled 2020-01-01: qty 2.5

## 2020-01-01 MED ORDER — FERROUS SULFATE 325 (65 FE) MG PO TABS
325.0000 mg | ORAL_TABLET | Freq: Two times a day (BID) | ORAL | Status: DC
  Administered 2020-01-02: 325 mg via ORAL
  Filled 2020-01-01: qty 1

## 2020-01-01 MED ORDER — INSULIN ASPART 100 UNIT/ML ~~LOC~~ SOLN
0.0000 [IU] | Freq: Three times a day (TID) | SUBCUTANEOUS | Status: DC
  Administered 2020-01-02: 2 [IU] via SUBCUTANEOUS

## 2020-01-01 MED ORDER — HEPARIN SODIUM (PORCINE) 5000 UNIT/ML IJ SOLN
5000.0000 [IU] | Freq: Three times a day (TID) | INTRAMUSCULAR | Status: DC
  Administered 2020-01-01 – 2020-01-02 (×3): 5000 [IU] via SUBCUTANEOUS
  Filled 2020-01-01 (×3): qty 1

## 2020-01-01 MED ORDER — NITROGLYCERIN 0.4 MG SL SUBL: 0.4000 mg | SUBLINGUAL_TABLET | SUBLINGUAL | Status: DC | PRN

## 2020-01-01 MED ORDER — ACETAMINOPHEN 325 MG PO TABS: 650.0000 mg | ORAL_TABLET | Freq: Four times a day (QID) | ORAL | Status: DC | PRN

## 2020-01-01 NOTE — Procedures (Signed)
   I was present at this dialysis session, have reviewed the session itself and made  appropriate changes [name] MD [place] pager [phone]   01/01/2020, 5:47 PM

## 2020-01-01 NOTE — Care Management (Signed)
ED CM attempted to contact HD center [facility] [phone] no anwser updated EDP.

## 2020-01-01 NOTE — Progress Notes (Signed)
RT attempted arterial blood gas, but sample is mixed venous. SpO2 currently 100% on 2L [place]. RT will re-collect if needed.

## 2020-01-01 NOTE — ED Provider Notes (Signed)
TIME SEEN: 6:00 AM  CHIEF COMPLAINT: Shortness of breath  HPI: Patient is a 69 year old female with history of CHF, end-stage renal disease on hemodialysis Tuesday, Thursday and Saturday, hypertension, diabetes, CVA, reported history of asthma and tobacco use who presents to the emergency department shortness of breath that started yesterday around 1:30 PM. No chest pain or chest discomfort. No fever or cough. EMS was called tonight and sats were found to be 70% on room air at rest. States she has diffuse wheezing. Does not wear oxygen chronically. Received 5 mg of albuterol, 125 mg of IV Solu-Medrol, 2 g of IV magnesium. Reports feeling better. Arrives to the ED with EMS on a nonrebreather. States last dialysis was Tuesday, December 29. Scheduled to go to dialysis today.  ROS: See HPI Constitutional: no fever  Eyes: no drainage  ENT: no runny nose   Cardiovascular:  no chest pain  Resp:  SOB  GI: no vomiting GU: no dysuria Integumentary: no rash  Allergy: no hives  Musculoskeletal:  leg swelling  Neurological: no slurred speech ROS otherwise negative  PAST MEDICAL HISTORY/PAST SURGICAL HISTORY:  Past Medical History:  Diagnosis Date  . Abscess   . Acute blood loss anemia 08/17/2019  . Acute respiratory failure ([place]) 10/18/2014  . Acute respiratory failure with hypoxia and hypercapnia ([place]) 06/01/2019  . Anemia 08/2016  . Angiodysplasia of colon   . Arthritis of left shoulder region 03/23/2013  . Bleeding gastrointestinal   . Cardiomegaly 05/2019  . Chest pain 04/17/2016  . Chronic combined systolic and diastolic CHF (congestive heart failure) (HCC)    a. EF 40-45%, mild LVH, mid apicalanteroseptal and apical HK.  . CKD (chronic kidney disease), stage III   . Cocaine abuse ([place])    crack cocaine heavily until 2008 then sporadic use since then  . Coronary artery disease    a. 06/2012 NSTEMI/CABG x 3 (LIMA->LAD, VG->OM2, VG->LCX);  b. 04/2015 MV: EF<30%, mid ant, apicalanterior,  apical infarct;  c. 04/2015 Cath: LM nl, LAD 90p, LCX 67m, OM1 min irregs, RCA mild dzs, LIMA->LAD nl w/ dist LAD dzs, VG->OM2 nl, VG->LCX nl-->Med Rx.  . CVA (cerebral infarction)    a. right internal capsule stroke in 12/2006  . Demand ischemia ([place])   . Diabetes mellitus    diagnosed in 2008  . Elevated troponin 04/27/2019  . Essential hypertension   . Glaucoma   . Gout   . Heme positive stool   . HFrEF (heart failure with reduced ejection fraction) ([place])   . Hyperlipidemia   . Hyperparathyroidism, secondary renal ([place])   . Hypertensive crisis 06/02/2019  . Left-sided sensory deficit present   . Lobar pneumonia ([place]) 04/27/2019  . Obesity, morbid ([place])   . Pneumonia   . Pulmonary edema 05/2019  . PVD (peripheral vascular disease) ([place])    a. 06/2012 ABI's: R - 0.73, L - 0.71.  Marland Kitchen Renal mass, right   . Sepsis ([place]) 04/27/2019  . Shortness of breath dyspnea   . Stroke ([place])   . Thrombocytosis ([place]) 04/17/2016  . Thyroid nodule    FNA in 6195 showed follicular cells but not definate neoplasm  . Tobacco abuse   . Trichomoniasis     MEDICATIONS:  Prior to Admission medications   Medication Sig Start Date End Date Taking? Authorizing Provider  allopurinol (ZYLOPRIM) 100 MG tablet Take 100 mg by mouth 2 (two) times daily.  11/25/17   [provider]  aspirin EC 325 MG tablet Take 325 mg by  mouth daily. 10/28/19   [provider]  brinzolamide (AZOPT) 1 % ophthalmic suspension Place 1 drop into both eyes 2 (two) times a day.     [provider]  buPROPion (WELLBUTRIN SR) 150 MG 12 hr tablet Take 1 tablet (150 mg total) by mouth 2 (two) times daily. 01/16/16   Mikhail, Velta Addison, DO  cetirizine (ZYRTEC) 10 MG tablet Take 10 mg by mouth daily as needed for allergies.     [provider]  ferrous sulfate 325 (65 FE) MG tablet Take 325 mg by mouth 2 (two) times daily with a meal.     [provider]  gabapentin (NEURONTIN) 300 MG capsule Take 1 capsule  (300 mg total) by mouth daily. 12/12/19 01/11/20  Domenic Polite, MD  insulin aspart (NOVOLOG) 100 UNIT/ML injection Inject 0-15 Units into the skin 3 (three) times daily with meals. Patient taking differently: Inject 2 Units into the skin 3 (three) times daily with meals.  10/11/19   Nicole Kindred A, DO  ipratropium (ATROVENT HFA) 17 MCG/ACT inhaler Inhale 2 puffs into the lungs every 6 (six) hours as needed for wheezing.    [provider]  latanoprost (XALATAN) 0.005 % ophthalmic solution Place 1 drop into both eyes at bedtime.    [provider]  LINZESS 72 MCG capsule Take 72 mcg by mouth daily before breakfast.  11/21/17   [provider]  metoprolol tartrate (LOPRESSOR) 25 MG tablet Take 0.5 tablets (12.5 mg total) by mouth 2 (two) times daily. Patient taking differently: Take 25 mg by mouth 2 (two) times daily.  10/11/19   Ezekiel Slocumb, DO  mometasone-formoterol (DULERA) 100-5 MCG/ACT AERO Inhale 2 puffs into the lungs every 4 (four) hours as needed for wheezing.    [provider]  nitroGLYCERIN (NITROSTAT) 0.4 MG SL tablet Place 1 tablet (0.4 mg total) under the tongue every 5 (five) minutes as needed for chest pain. 04/30/19   [name], MD  pantoprazole (PROTONIX) 40 MG tablet Take 1 tablet (40 mg total) by mouth daily. 08/26/19   Jeanmarie Hubert, MD  polyethylene glycol (MIRALAX / GLYCOLAX) 17 g packet Take 17 g by mouth daily as needed for mild constipation.     [provider]  pravastatin (PRAVACHOL) 40 MG tablet Take 40 mg by mouth daily. 10/28/19   [provider]  predniSONE (DELTASONE) 20 MG tablet Take 0.5-1 tablets (10-20 mg total) by mouth daily. Take 20mg  daily for 2days then 10mg  daily for 3days then STOP 12/12/19   Domenic Polite, MD  [facility] HFA 108 [phone] Base) MCG/ACT inhaler Inhale 2 puffs into the lungs every 6 (six) hours as needed for wheezing.  11/17/17   [provider]  VELPHORO 500 MG chewable tablet  Chew 500 mg by mouth 3 (three) times daily with meals. 11/22/19   [provider]  Grant Ruts INHUB 250-50 MCG/DOSE AEPB Inhale 2 puffs into the lungs daily.  05/04/19   [provider]    ALLERGIES:  Allergies  Allergen Reactions  . Naproxen Rash    SOCIAL HISTORY:  Social History   Tobacco Use  . Smoking status: Current Some Day Smoker    Packs/day: 0.25    Years: 50.00    Pack years: 12.50    Types: Cigarettes  . Smokeless tobacco: Never Used  Substance Use Topics  . Alcohol use: No    Alcohol/week: 0.0 standard drinks    FAMILY HISTORY: Family History  Problem Relation Age of Onset  .  Diabetes Mother   . Hypertension Mother   . Cancer Mother   . Hyperlipidemia Father   . Hypertension Father   . Kidney disease Father   . Gout Father   . Cerebrovascular Accident Father   . Other Other        no known family CAD    EXAM: BP (!) 122/59   Pulse (!) 104   Temp 98.1 F (36.7 C) (Tympanic)   Resp (!) 27   Wt 118 kg   SpO2 99%   BMI 39.55 kg/m  CONSTITUTIONAL: Alert and oriented and responds appropriately to questions. Chronically ill-appearing HEAD: Normocephalic EYES: Conjunctivae clear, pupils appear equal, EOM appear intact ENT: normal nose; moist mucous membranes NECK: Supple, normal ROM CARD: Regular and tachycardic; S1 and S2 appreciated; no murmurs, no clicks, no rubs, no gallops CHEST: Right-sided dialysis catheter RESP: Minister aeration at bases, scattered expiratory wheezes, mildly tachypneic, no rhonchi, no rales, no hypoxia on 3 L nasal cannula or respiratory distress, speaking full sentences ABD/GI: Normal bowel sounds; non-distended; soft, non-tender, no rebound, no guarding, no peritoneal signs, no hepatosplenomegaly BACK:  The back appears normal EXT: Normal ROM in all joints; no deformity noted, mild pitting edema to the mid shins bilaterally; no cyanosis, no calf swelling or calf tenderness, AV fistula in the left upper extremity  with normal thrill and no tenderness, redness or warmth SKIN: Normal color for age and race; warm; no rash on exposed skin NEURO: Moves all extremities equally PSYCH: The patient's mood and manner are appropriate.   MEDICAL DECISION MAKING: Patient here with shortness of breath. Has new onset respiratory failure with hypoxia. Does not wear oxygen chronically. States she is feeling better after EMS treatments. She has improved aeration and diminished wheezing.  Differential includes bronchospasm, CHF exacerbation, pulmonary edema from missed dialysis, pneumonia, COVID-19.  Will obtain labs, chest x-ray.  Will give albuterol, Atrovent as needed.  She reports feeling better and has been able to be weaned off nonrebreather and is on 3 L nasal cannula.  ED PROGRESS: Blood gas obtained appears to be venous in nature.  No hypercapnia.  Chest x-ray shows pulmonary edema versus atypical pneumonia.  She denies any known Covid exposures but will obtain 6 to 24-hour Covid swab.  Sats currently 93% on room air at rest.  Will ambulate with pulse oximeter.  Troponin slightly elevated at 71 but this is in the setting of end-stage renal disease.  Previous high-sensitivity troponins in August were in the 63s.  Will repeat troponin to ensure no significant elevation.  Her potassium today is normal at 4.9 and she is not hypertensive.  I do not feel she needs emergent dialysis unless she becomes hypoxic again.  Repeat troponin ordered for 8 AM.  Signed out to Dr. Ronnald Nian to follow up on repeat troponin and how patient did walking off O2.  If no hypoxia, will dc home to dialysis this AM with albuterol and atrovent inhalers provided in ED and steroid burst.  If hypoxic, patient will need admission.   I reviewed all nursing notes and pertinent previous records as available.  I have interpreted any EKGs, lab and urine results, imaging (as available).    EKG Interpretation  Date/Time:  Sunday January 01 2020 06:05:45  EST Ventricular Rate:  112 PR Interval:    QRS Duration: 98 QT Interval:  355 QTC Calculation: 485 R Axis:   33 Text Interpretation: Sinus tachycardia Paired ventricular premature complexes Aberrant complex Probable left atrial enlargement Low  voltage, extremity leads Repol abnrm suggests ischemia, lateral leads Minimal ST elevation, lateral leads Artifact and baseline wander Confirmed by Devesh Monforte, Cyril Mourning [phone]) on 01/01/2020 6:12:45 AM        CRITICAL CARE Performed by: Cyril Mourning Dasja Brase   Total critical care time: 45 minutes  Critical care time was exclusive of separately billable procedures and treating other patients.  Critical care was necessary to treat or prevent imminent or life-threatening deterioration.  Critical care was time spent personally by me on the following activities: development of treatment plan with patient and/or surrogate as well as nursing, discussions with consultants, evaluation of patient's response to treatment, examination of patient, obtaining history from patient or surrogate, ordering and performing treatments and interventions, ordering and review of laboratory studies, ordering and review of radiographic studies, pulse oximetry and re-evaluation of patient's condition.   [name] [name] was evaluated in Emergency Department on 01/01/2020 for the symptoms described in the history of present illness. She was evaluated in the context of the global COVID-19 pandemic, which necessitated consideration that the patient might be at risk for infection with the SARS-CoV-2 virus that causes COVID-19. Institutional protocols and algorithms that pertain to the evaluation of patients at risk for COVID-19 are in a state of rapid change based on information released by regulatory bodies including the CDC and federal and state organizations. These policies and algorithms were followed during the patient's care in the ED.  Patient was seen wearing N95, face shield, gloves.     Elizzie Westergard, Delice Bison, DO 01/01/20 [phone]

## 2020-01-01 NOTE — ED Provider Notes (Addendum)
Patient signed out to me at 7 AM.  Patient with history of heart failure, end-stage renal disease on hemodialysis, hypertension, asthma who presented to the ED with shortness of breath.  Patient was possibly hypoxic with EMS but has been on room air upon arrival to the ED.  She received albuterol, Solu-Medrol, magnesium and feels improved.  Her last dialysis was on Tuesday.  She is scheduled to go to dialysis today.  Work-up shows mild acidosis and mild leukocytosis.  However hemoglobin at baseline.  Creatinine at baseline.  Troponin and BNP mildly elevated and chest x-ray likely show some volume overload EKG reassuring.  Plan is to repeat troponin and evaluate patient from respiratory standpoint with ambulation with pulse ox.   Throughout my care patient is somnolent and fairly difficult to arouse.  We were able to ambulate her with normal room air oxygenation but she quickly got tired.  Talked with case manager to anticipate possibly getting her transported directly to her dialysis unit however unable to get this done.  Repeat troponin also continues to be elevated from 70-110.  Overall patient appears to have multifactorial shortness of breath likely in the setting of a COPD exacerbation and likely in the setting of missed dialysis sessions.  She is likely developing symptoms of uremia as she is mildly acidotic as well.  Will call nephrology to help arrange for dialysis.  Will call hospitalist for admission. COVID test is ordered.   This chart was dictated using voice recognition software.  Despite best efforts to proofread,  errors can occur which can change the documentation meaning.     Lennice Sites, DO 01/01/20 Heron, [place], DO 01/01/20 1054

## 2020-01-01 NOTE — Consult Note (Signed)
[facility] Renal Consultation Note    Indication for Consultation:  Management of ESRD/hemodialysis; anemia, hypertension/volume and secondary hyperparathyroidism PCP:  HPI: [name] [name] is a 69 y.o. female with ESRD on hemodialysis T,Th,S at [facility]. 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 Last HD 12/29/2019 (missed two treatments before this treatment) Arrived with 7.6 kg on, only had 0.3 kg removed unless weight incorrectly recorded.   Patient was seen earlier this AM in ED. She was awake, alert, oriented X 3. She said she missed HD D/T being SOB and not having transportation to HD. She was on RA without WOB. Per nursing staff, patient ambulated in hallway on RA without dropping O2 sat prior to my exam. Later she became progressively lethargic and is now minimally responsive. K+ 4.9 Co2 18 SCr 7.57 BUN 68 WBC 10.6 HGB 8.5 PLT 350. AGAS pH 7.26 PCo2 46.4 POs 31 Troponin 109. She is COVID 19/influenza negative.  CXR with bilateral interstitial pulmonary opacities. No CT of head has been done.   She had no other complaints when seen earlier other than SOB. She has now been admitted per primary for acute hypoxic metabolic failure (volume overload) and acute encephalopathy.   Past Medical History:  Diagnosis Date  . Abscess   . Acute blood loss anemia 08/17/2019  . Acute respiratory failure ([place]) 10/18/2014  . Acute respiratory failure with hypoxia and hypercapnia ([place]) 06/01/2019  . Anemia 08/2016  . Angiodysplasia of colon   . Arthritis of left shoulder region 03/23/2013  . Bleeding gastrointestinal   . Cardiomegaly 05/2019  . Chest pain 04/17/2016  . Chronic combined systolic and diastolic CHF (congestive heart failure) (HCC)    a. EF 40-45%, mild LVH, mid apicalanteroseptal and apical HK.  . CKD (chronic kidney disease), stage III   . Cocaine abuse ([place])    crack cocaine heavily  until 2008 then sporadic use since then  . Coronary artery disease    a. 06/2012 NSTEMI/CABG x 3 (LIMA->LAD, VG->OM2, VG->LCX);  b. 04/2015 MV: EF<30%, mid ant, apicalanterior, apical infarct;  c. 04/2015 Cath: LM nl, LAD 90p, LCX 38m, OM1 min irregs, RCA mild dzs, LIMA->LAD nl w/ dist LAD dzs, VG->OM2 nl, VG->LCX nl-->Med Rx.  . CVA (cerebral infarction)    a. right internal capsule stroke in 12/2006  . Demand ischemia ([place])   . Diabetes mellitus    diagnosed in 2008  . Elevated troponin 04/27/2019  . Essential hypertension   . Glaucoma   . Gout   . Heme positive stool   . HFrEF (heart failure with reduced ejection fraction) ([place])   . Hyperlipidemia   . Hyperparathyroidism, secondary renal ([place])   . Hypertensive crisis 06/02/2019  . Left-sided sensory deficit present   . Lobar pneumonia ([place]) 04/27/2019  . Obesity, morbid ([place])   . Pneumonia   . Pulmonary edema 05/2019  . PVD (peripheral vascular disease) ([place])    a. 06/2012 ABI's: R - 0.73, L - 0.71.  Marland Kitchen Renal mass, right   . Sepsis ([place]) 04/27/2019  . Shortness of breath dyspnea   . Stroke ([place])   . Thrombocytosis ([place]) 04/17/2016  . Thyroid nodule    FNA in 6269 showed follicular cells but not definate neoplasm  . Tobacco abuse   . Trichomoniasis    Past Surgical History:  Procedure Laterality Date  . AV FISTULA PLACEMENT Left 08/25/2019   Procedure: ARTERIOVENOUS (AV) BRACHIOCEPHALIC FISTULA  CREATION;  Surgeon: Waynetta Sandy, MD;  Location: [place];  Service: Vascular;  Laterality: Left;  . CARDIAC CATHETERIZATION    . CARDIAC CATHETERIZATION N/A 05/17/2015   Procedure: Left Heart Cath and Cors/Grafts Angiography;  Surgeon: [name], MD;  Location: [place] CV LAB;  Service: Cardiovascular;  Laterality: N/A;  . COLONOSCOPY WITH PROPOFOL N/A 04/21/2016   Procedure: COLONOSCOPY WITH PROPOFOL;  Surgeon: Irene Shipper, MD;  Location: [place];  Service: Endoscopy;  Laterality: N/A;  . CORONARY ARTERY BYPASS GRAFT   07/09/2012   Procedure: CORONARY ARTERY BYPASS GRAFTING (CABG);  Surgeon: Ivin Poot, MD;  Location: [place];  Service: Open Heart Surgery;  Laterality: N/A;  . ENTEROSCOPY N/A 10/07/2019   Procedure: ENTEROSCOPY;  Surgeon: Jackquline Denmark, MD;  Location: [facility] ENDOSCOPY;  Service: Endoscopy;  Laterality: N/A;  . ESOPHAGOGASTRODUODENOSCOPY N/A 04/20/2016   Procedure: ESOPHAGOGASTRODUODENOSCOPY (EGD);  Surgeon: Gatha Mayer, MD;  Location: [facility] ENDOSCOPY;  Service: Endoscopy;  Laterality: N/A;  . ESOPHAGOGASTRODUODENOSCOPY (EGD) WITH PROPOFOL N/A 08/17/2019   Procedure: ESOPHAGOGASTRODUODENOSCOPY (EGD) WITH PROPOFOL;  Surgeon: Irene Shipper, MD;  Location: [facility] ENDOSCOPY;  Service: Endoscopy;  Laterality: N/A;  . INSERTION OF DIALYSIS CATHETER Right 08/25/2019   Procedure: INSERTION OF DIALYSIS CATHETER;  Surgeon: Waynetta Sandy, MD;  Location: [place];  Service: Vascular;  Laterality: Right;  . IR FLUORO GUIDE CV LINE RIGHT  08/19/2019  . IR US GUIDE VASC ACCESS RIGHT  08/19/2019  . LEFT HEART CATHETERIZATION WITH CORONARY ANGIOGRAM N/A 06/29/2012   Procedure: LEFT HEART CATHETERIZATION WITH CORONARY ANGIOGRAM;  Surgeon: Peter M Martinique, MD;  Location: [facility] CATH LAB;  Service: Cardiovascular;  Laterality: N/A;  . STERNAL WOUND DEBRIDEMENT  08/17/2012   Procedure: STERNAL WOUND DEBRIDEMENT;  Surgeon: Ivin Poot, MD;  Location: [facility] OR;  Service: Thoracic;  Laterality: N/A;  wound vac application  . STERNAL WOUND DEBRIDEMENT  08/24/2012   Procedure: STERNAL WOUND DEBRIDEMENT;  Surgeon: Ivin Poot, MD;  Location: [place];  Service: Thoracic;  Laterality: N/A;  . STERNAL WOUND DEBRIDEMENT  09/01/2012   Procedure: STERNAL WOUND DEBRIDEMENT;  Surgeon: Ivin Poot, MD;  Location: [place];  Service: Thoracic;  Laterality: N/A;  . STERNAL WOUND DEBRIDEMENT  09/20/2012   Procedure: STERNAL WOUND DEBRIDEMENT;  Surgeon: Ivin Poot, MD;  Location: [facility] OR;  Service: Thoracic;  Laterality: N/A;  wound vac  change  . SUBMUCOSAL TATTOO INJECTION  10/07/2019   Procedure: SUBMUCOSAL TATTOO INJECTION;  Surgeon: Jackquline Denmark, MD;  Location: [facility] ENDOSCOPY;  Service: Endoscopy;;   Family History  Problem Relation Age of Onset  . Diabetes Mother   . Hypertension Mother   . Cancer Mother   . Hyperlipidemia Father   . Hypertension Father   . Kidney disease Father   . Gout Father   . Cerebrovascular Accident Father   . Other Other        no known family CAD   Social History:  reports that she has been smoking cigarettes. She has a 12.50 pack-year smoking history. She has never used smokeless tobacco. She reports current drug use. Drug: Cocaine. She reports that she does not drink alcohol. Allergies  Allergen Reactions  . Naproxen Rash   Prior to Admission medications   Medication Sig Start Date End Date Taking? Authorizing Provider  allopurinol (ZYLOPRIM) 100 MG tablet Take 100 mg by mouth 2 (two) times daily.  11/25/17  Yes [provider]  aspirin EC 325 MG tablet Take 325 mg  by mouth daily. 10/28/19  Yes [provider]  buPROPion (WELLBUTRIN SR) 150 MG 12 hr tablet Take 1 tablet (150 mg total) by mouth 2 (two) times daily. 01/16/16  Yes Mikhail, Velta Addison, DO  cetirizine (ZYRTEC) 10 MG tablet Take 10 mg by mouth daily as needed for allergies.    Yes [provider]  ferrous sulfate 325 (65 FE) MG tablet Take 325 mg by mouth 2 (two) times daily with a meal.    Yes [provider]  gabapentin (NEURONTIN) 300 MG capsule Take 1 capsule (300 mg total) by mouth daily. 12/12/19 01/11/20 Yes Domenic Polite, MD  insulin aspart (NOVOLOG) 100 UNIT/ML injection Inject 0-15 Units into the skin 3 (three) times daily with meals. Patient taking differently: Inject 2 Units into the skin 3 (three) times daily with meals.  10/11/19  Yes Nicole Kindred A, DO  ipratropium (ATROVENT HFA) 17 MCG/ACT inhaler Inhale 2 puffs into the lungs every 6 (six) hours as needed for wheezing.    Yes [provider]  LINZESS 72 MCG capsule Take 72 mcg by mouth daily before breakfast.  11/21/17  Yes [provider]  metoprolol tartrate (LOPRESSOR) 25 MG tablet Take 0.5 tablets (12.5 mg total) by mouth 2 (two) times daily. Patient taking differently: Take 25 mg by mouth 2 (two) times daily.  10/11/19  Yes Nicole Kindred A, DO  nitroGLYCERIN (NITROSTAT) 0.4 MG SL tablet Place 1 tablet (0.4 mg total) under the tongue every 5 (five) minutes as needed for chest pain. 04/30/19  Yes [name], MD  pantoprazole (PROTONIX) 40 MG tablet Take 1 tablet (40 mg total) by mouth daily. 08/26/19  Yes Jeanmarie Hubert, MD  polyethylene glycol (MIRALAX / GLYCOLAX) 17 g packet Take 17 g by mouth daily as needed for mild constipation.    Yes [provider]  pravastatin (PRAVACHOL) 40 MG tablet Take 40 mg by mouth daily. 10/28/19  Yes [provider]  VELPHORO 500 MG chewable tablet Chew 500 mg by mouth 3 (three) times daily with meals. 11/22/19  Yes [provider]  brinzolamide (AZOPT) 1 % ophthalmic suspension Place 1 drop into both eyes 2 (two) times a day.     [provider]  latanoprost (XALATAN) 0.005 % ophthalmic solution Place 1 drop into both eyes at bedtime.    [provider]  mometasone-formoterol (DULERA) 100-5 MCG/ACT AERO Inhale 2 puffs into the lungs every 4 (four) hours as needed for wheezing.    [provider]  predniSONE (DELTASONE) 20 MG tablet Take 3 tablets (60 mg total) by mouth daily. 01/01/20   Ward, Delice Bison, DO  PROAIR HFA 108 (90 Base) MCG/ACT inhaler Inhale 2 puffs into the lungs every 6 (six) hours as needed for wheezing.  11/17/17   [provider]  Grant Ruts INHUB 250-50 MCG/DOSE AEPB Inhale 2 puffs into the lungs daily.  05/04/19   [provider]   Current Facility-Administered Medications  Medication Dose Route Frequency Provider Last Rate Last Admin  . acetaminophen (TYLENOL) tablet 650 mg  650  mg Oral Q6H PRN Wynetta Fines T, MD       Or  . acetaminophen (TYLENOL) suppository 650 mg  650 mg Rectal Q6H PRN Wynetta Fines T, MD      . albuterol (PROVENTIL) (2.5 MG/3ML) 0.083% nebulizer solution 2.5 mg  2.5 mg Inhalation Q6H PRN Wynetta Fines T, MD      . albuterol (VENTOLIN HFA) 108 (90 Base) MCG/ACT inhaler 8 puff  8 puff  Inhalation Once Ward, Delice Bison, DO      . allopurinol (ZYLOPRIM) tablet 100 mg  100 mg Oral BID Wynetta Fines T, MD      . Derrill Memo ON 01/02/2020] aspirin EC tablet 325 mg  325 mg Oral Daily Zhang, Pearletha Forge T, MD      . brinzolamide (AZOPT) 1 % ophthalmic suspension 1 drop  1 drop Both Eyes TID Wynetta Fines T, MD      . buPROPion [facility] SR) 12 hr tablet 150 mg  150 mg Oral BID Wynetta Fines T, MD      . Derrill Memo ON 01/02/2020] Chlorhexidine Gluconate Cloth 2 % PADS 6 each  6 each Topical Q0600 Valentina Gu, NP      . ferrous sulfate tablet 325 mg  325 mg Oral BID WC Wynetta Fines T, MD      . Derrill Memo ON 01/02/2020] gabapentin (NEURONTIN) capsule 300 mg  300 mg Oral Daily Wynetta Fines T, MD      . heparin injection 5,000 Units  5,000 Units Subcutaneous Q8H Lequita Halt, MD   5,000 Units at 01/01/20 1410  . insulin aspart (novoLOG) injection 0-9 Units  0-9 Units Subcutaneous TID WC Wynetta Fines T, MD      . ipratropium (ATROVENT HFA) inhaler 2 puff  2 puff Inhalation Once Ward, Kristen N, DO      . ipratropium (ATROVENT) nebulizer solution 0.5 mg  0.5 mg Inhalation Q6H PRN Wynetta Fines T, MD      . latanoprost (XALATAN) 0.005 % ophthalmic solution 1 drop  1 drop Both Eyes QHS Wynetta Fines T, MD      . Derrill Memo ON 01/02/2020] linaclotide (LINZESS) capsule 72 mcg  72 mcg Oral QAC breakfast Wynetta Fines T, MD      . Derrill Memo ON 01/02/2020] loratadine (CLARITIN) tablet 10 mg  10 mg Oral Daily Wynetta Fines T, MD      . metoprolol tartrate (LOPRESSOR) tablet 12.5 mg  12.5 mg Oral BID Wynetta Fines T, MD      . mometasone-formoterol (DULERA) 100-5 MCG/ACT inhaler 2 puff  2 puff Inhalation BID Wynetta Fines T,  MD      . nitroGLYCERIN (NITROSTAT) SL tablet 0.4 mg  0.4 mg Sublingual Q5 min PRN Wynetta Fines T, MD      . pantoprazole (PROTONIX) EC tablet 40 mg  40 mg Oral Daily Wynetta Fines T, MD   40 mg at 01/01/20 1408  . polyethylene glycol (MIRALAX / GLYCOLAX) packet 17 g  17 g Oral Daily PRN Wynetta Fines T, MD      . Derrill Memo ON 01/02/2020] pravastatin (PRAVACHOL) tablet 40 mg  40 mg Oral Daily Wynetta Fines T, MD      . sodium bicarbonate tablet 650 mg  650 mg Oral TID Lequita Halt, MD   650 mg at 01/01/20 1408  . sucroferric oxyhydroxide (VELPHORO) chewable tablet 500 mg  500 mg Oral TID WC Lequita Halt, MD       Current Outpatient Medications  Medication Sig Dispense Refill  . allopurinol (ZYLOPRIM) 100 MG tablet Take 100 mg by mouth 2 (two) times daily.     Marland Kitchen aspirin EC 325 MG tablet Take 325 mg by mouth daily.    Marland Kitchen buPROPion (WELLBUTRIN SR) 150 MG 12 hr tablet Take 1 tablet (150 mg total) by mouth 2 (two) times daily. 60 tablet 0  . cetirizine (ZYRTEC) 10 MG tablet Take 10 mg by mouth daily as needed for allergies.     Marland Kitchen  ferrous sulfate 325 (65 FE) MG tablet Take 325 mg by mouth 2 (two) times daily with a meal.     . gabapentin (NEURONTIN) 300 MG capsule Take 1 capsule (300 mg total) by mouth daily.  0  . insulin aspart (NOVOLOG) 100 UNIT/ML injection Inject 0-15 Units into the skin 3 (three) times daily with meals. (Patient taking differently: Inject 2 Units into the skin 3 (three) times daily with meals. ) 10 mL 11  . ipratropium (ATROVENT HFA) 17 MCG/ACT inhaler Inhale 2 puffs into the lungs every 6 (six) hours as needed for wheezing.    Marland Kitchen LINZESS 72 MCG capsule Take 72 mcg by mouth daily before breakfast.   3  . metoprolol tartrate (LOPRESSOR) 25 MG tablet Take 0.5 tablets (12.5 mg total) by mouth 2 (two) times daily. (Patient taking differently: Take 25 mg by mouth 2 (two) times daily. ) 60 tablet 1  . nitroGLYCERIN (NITROSTAT) 0.4 MG SL tablet Place 1 tablet (0.4 mg total) under the tongue every 5  (five) minutes as needed for chest pain. 30 tablet 0  . pantoprazole (PROTONIX) 40 MG tablet Take 1 tablet (40 mg total) by mouth daily. 30 tablet 0  . polyethylene glycol (MIRALAX / GLYCOLAX) 17 g packet Take 17 g by mouth daily as needed for mild constipation.     . pravastatin (PRAVACHOL) 40 MG tablet Take 40 mg by mouth daily.    . VELPHORO 500 MG chewable tablet Chew 500 mg by mouth 3 (three) times daily with meals.    . brinzolamide (AZOPT) 1 % ophthalmic suspension Place 1 drop into both eyes 2 (two) times a day.     . latanoprost (XALATAN) 0.005 % ophthalmic solution Place 1 drop into both eyes at bedtime.    . mometasone-formoterol (DULERA) 100-5 MCG/ACT AERO Inhale 2 puffs into the lungs every 4 (four) hours as needed for wheezing.    . predniSONE (DELTASONE) 20 MG tablet Take 3 tablets (60 mg total) by mouth daily. 15 tablet 0  . PROAIR HFA 108 (90 Base) MCG/ACT inhaler Inhale 2 puffs into the lungs every 6 (six) hours as needed for wheezing.   3  . WIXELA INHUB 250-50 MCG/DOSE AEPB Inhale 2 puffs into the lungs daily.      Labs: Basic Metabolic Panel: Recent Labs  Lab 01/01/20 0600 01/01/20 0614  NA 140 142  K 4.9 4.8  CL 109  --   CO2 18*  --   GLUCOSE 308*  --   BUN 68*  --   CREATININE 7.57*  --   CALCIUM 7.5*  --    Liver Function Tests: No results for input(s): AST, ALT, ALKPHOS, BILITOT, PROT, ALBUMIN in the last 168 hours. No results for input(s): LIPASE, AMYLASE in the last 168 hours. No results for input(s): AMMONIA in the last 168 hours. CBC: Recent Labs  Lab 01/01/20 0600 01/01/20 0614  WBC 10.6*  --   NEUTROABS 9.5*  --   HGB 8.5* 10.2*  HCT 29.5* 30.0*  MCV 90.2  --   PLT 350  --    Cardiac Enzymes: No results for input(s): CKTOTAL, CKMB, CKMBINDEX, TROPONINI in the last 168 hours. CBG: No results for input(s): GLUCAP in the last 168 hours. Iron Studies: No results for input(s): IRON, TIBC, TRANSFERRIN, FERRITIN in the last 72  hours. Studies/Results: DG Chest Portable 1 View  Result Date: 01/01/2020 CLINICAL DATA:  Shortness of breath. Additional history provided: Wheezing, missed dialysis EXAM: PORTABLE CHEST 1 VIEW  COMPARISON:  Chest radiograph 10/04/2019 FINDINGS: Unchanged position of a right IJ approach central venous catheter. Prior median sternotomy. Cardiomegaly is unchanged. Bilateral interstitial pulmonary opacities. No sizable pleural effusion or evidence of pneumothorax. No acute bony abnormality. Overlying cardiac monitoring leads. IMPRESSION: Unchanged cardiomegaly. Bilateral interstitial pulmonary opacities are nonspecific but may reflect edema or atypical/viral pneumonia. Electronically Signed   By: Kellie Simmering DO   On: 01/01/2020 06:24    ROS: As per HPI otherwise negative.   Physical Exam: Vitals:   01/01/20 1315 01/01/20 1330 01/01/20 1345 01/01/20 1400  BP: (!) 114/54 (!) 108/52 (!) 104/52 (!) 120/59  Pulse: 85 85 80 82  Resp: 16 16 16 15   Temp:      TempSrc:      SpO2: 98% 99% 97% 99%  Weight:         General: Obese female initially in NAD now minimally responsive.  Head: Normocephalic, atraumatic, sclera non-icteric, mucus membranes are moist Neck: Supple. JVD elevated 1/4 to mandible. Lungs: Bilateral breath sounds with bibasilar crackles. No WOB, no wheezing present.  Heart: RRR with S1 S2. No murmurs, rubs, or gallops appreciated. Abdomen: Soft, non-tender, non-distended with normoactive bowel sounds. No rebound/guarding. No obvious abdominal masses. M-S:  Strength and tone appear normal for age. Lower extremities: 1-2+ BLE edema Neuro:  Initially Alert and oriented X 3. Moves all extremities spontaneously. Now minimally responsive to sternal rub.  Psych:  Responds to questions appropriately with a normal affect. Dialysis Access: RIJ TDC drsg intact. L AVF + bruit  Dialysis Orders: [facility] T,Th,S 4 hours 15 min 180NRe 400/800 119.5 kg 2.0 K/ 2.0 Ca TDC/AVF -No heparin -Mircera 225  mcg IV q 2 weeks (last dose 12/29/2019 Last HGB 8.4 12/13/2019) -Venofer 100 mg IV X 8 doses (3/8 doses given last dose 12/29/2019 Fe 57 Tsat 22 12/13/2019) -Calcitriol 1.5 mcg PO TIW (last PTH 1008 12/13/2019)  Assessment/Plan: 1.  Acute Hypoxic Respiratory failure/Volume Overload: HD today off schedule and again tomorrow. Max UF as tolerated.  2. Mild troponin elevation-ESRD possible demand ischemia. Per primary 3.  Acute encephalopathy: Possible Uremia, see if mental status improves with HD. Possible OSA/Co2 retention.  4.  ESRD -  T,Th,S via TDC. K+ 4.9. Appropriate for 2.0 K bath. No heparin.  5.  Hypertension/volume  - BP 130/70 on arrival to ED, now on soft side. Metoprolol 12.5 mg PO BID on OP med list. Has not been given here. Max UF as tolerated but may not be successful D/T hypotension.  6.  Anemia  - HGB 8.5. Recent max ESA dose. Continue Fe load. Follow HGB.  7.  Metabolic bone disease - Continue binders, VDRA when able to eat. Give hectorol 4 mcg IV with HD today.  8.  Nutrition - Minimally responsive at present. Make NPO until more alert. Renal diet when able to eat.   Krystie Leiter H. Owens Shark, NP-C 01/01/2020, 3:39 PM  [employer] [phone]

## 2020-01-01 NOTE — H&P (Addendum)
History and Physical    FARDOWSA AUTHIER VFI:[id number] DOB: [birth year] DOA: 01/01/2020  PCP: [facility]   Patient coming from: Home  I have personally briefly reviewed patient's old medical records in [place]  Chief Complaint: SOB  HPI: [name] [name] is a 69 y.o. female with medical history significant of chronic diastolic heart failure, end-stage renal disease on hemodialysis TTS, hypertension, asthma who presented to the ED with shortness of breath.  Patient was reportedly hypoxic with EMS but has been on room air upon arrival to the ED.  She received albuterol, Solu-Medrol, magnesium and feels improved.  Her last dialysis was on Tuesday.  She is scheduled to go to dialysis today instead came to ED because of worsening of short of breath.   ED Course: Work-up shows mild acidosis and mild leukocytosis.  However hemoglobin at baseline.  Creatinine at baseline.  Troponin and BNP mildly elevated and chest x-ray likely show some volume overload EKG reassuring.  Plan is to repeat troponin and evaluate patient from respiratory standpoint with ambulation with pulse ox.   Patient is somnolent and fairly difficult to arouse.    ED contacted nephrology and recommend monitoring without emergency HD today. Pt was at one time able to ambulate her with normal room air oxygenation but she quickly got tired and became somnolent.  ED physician and case manager planned to transport pt directly to her dialysis unit however unable to get this done.   Repeat troponin also continues to be elevated from 70-110.  COVID test is negative. Decision was made to keep the patient for close monitoring until her mental status stabilized and get HD probably tomorrow.    Review of Systems: Unable to perform patient more lethargic  Past Medical History:  Diagnosis Date  . Abscess   . Acute blood loss anemia 08/17/2019  . Acute respiratory failure ([place]) 10/18/2014  . Acute respiratory  failure with hypoxia and hypercapnia ([place]) 06/01/2019  . Anemia 08/2016  . Angiodysplasia of colon   . Arthritis of left shoulder region 03/23/2013  . Bleeding gastrointestinal   . Cardiomegaly 05/2019  . Chest pain 04/17/2016  . Chronic combined systolic and diastolic CHF (congestive heart failure) (HCC)    a. EF 40-45%, mild LVH, mid apicalanteroseptal and apical HK.  . CKD (chronic kidney disease), stage III   . Cocaine abuse ([place])    crack cocaine heavily until 2008 then sporadic use since then  . Coronary artery disease    a. 06/2012 NSTEMI/CABG x 3 (LIMA->LAD, VG->OM2, VG->LCX);  b. 04/2015 MV: EF<30%, mid ant, apicalanterior, apical infarct;  c. 04/2015 Cath: LM nl, LAD 90p, LCX 61m, OM1 min irregs, RCA mild dzs, LIMA->LAD nl w/ dist LAD dzs, VG->OM2 nl, VG->LCX nl-->Med Rx.  . CVA (cerebral infarction)    a. right internal capsule stroke in 12/2006  . Demand ischemia ([place])   . Diabetes mellitus    diagnosed in 2008  . Elevated troponin 04/27/2019  . Essential hypertension   . Glaucoma   . Gout   . Heme positive stool   . HFrEF (heart failure with reduced ejection fraction) ([place])   . Hyperlipidemia   . Hyperparathyroidism, secondary renal ([place])   . Hypertensive crisis 06/02/2019  . Left-sided sensory deficit present   . Lobar pneumonia ([place]) 04/27/2019  . Obesity, morbid ([place])   . Pneumonia   . Pulmonary edema 05/2019  . PVD (peripheral vascular disease) ([place])    a. 06/2012 ABI's:  R - 0.73, L - 0.71.  Marland Kitchen Renal mass, right   . Sepsis ([place]) 04/27/2019  . Shortness of breath dyspnea   . Stroke ([place])   . Thrombocytosis ([place]) 04/17/2016  . Thyroid nodule    FNA in 7412 showed follicular cells but not definate neoplasm  . Tobacco abuse   . Trichomoniasis     Past Surgical History:  Procedure Laterality Date  . AV FISTULA PLACEMENT Left 08/25/2019   Procedure: ARTERIOVENOUS (AV) BRACHIOCEPHALIC FISTULA CREATION;  Surgeon: Waynetta Sandy, MD;  Location: [place];  Service:  Vascular;  Laterality: Left;  . CARDIAC CATHETERIZATION    . CARDIAC CATHETERIZATION N/A 05/17/2015   Procedure: Left Heart Cath and Cors/Grafts Angiography;  Surgeon: [name], MD;  Location: [place] CV LAB;  Service: Cardiovascular;  Laterality: N/A;  . COLONOSCOPY WITH PROPOFOL N/A 04/21/2016   Procedure: COLONOSCOPY WITH PROPOFOL;  Surgeon: Irene Shipper, MD;  Location: [place];  Service: Endoscopy;  Laterality: N/A;  . CORONARY ARTERY BYPASS GRAFT  07/09/2012   Procedure: CORONARY ARTERY BYPASS GRAFTING (CABG);  Surgeon: Ivin Poot, MD;  Location: [place];  Service: Open Heart Surgery;  Laterality: N/A;  . ENTEROSCOPY N/A 10/07/2019   Procedure: ENTEROSCOPY;  Surgeon: Jackquline Denmark, MD;  Location: [facility] ENDOSCOPY;  Service: Endoscopy;  Laterality: N/A;  . ESOPHAGOGASTRODUODENOSCOPY N/A 04/20/2016   Procedure: ESOPHAGOGASTRODUODENOSCOPY (EGD);  Surgeon: Gatha Mayer, MD;  Location: [facility] ENDOSCOPY;  Service: Endoscopy;  Laterality: N/A;  . ESOPHAGOGASTRODUODENOSCOPY (EGD) WITH PROPOFOL N/A 08/17/2019   Procedure: ESOPHAGOGASTRODUODENOSCOPY (EGD) WITH PROPOFOL;  Surgeon: Irene Shipper, MD;  Location: [facility] ENDOSCOPY;  Service: Endoscopy;  Laterality: N/A;  . INSERTION OF DIALYSIS CATHETER Right 08/25/2019   Procedure: INSERTION OF DIALYSIS CATHETER;  Surgeon: Waynetta Sandy, MD;  Location: [place];  Service: Vascular;  Laterality: Right;  . IR FLUORO GUIDE CV LINE RIGHT  08/19/2019  . IR US GUIDE VASC ACCESS RIGHT  08/19/2019  . LEFT HEART CATHETERIZATION WITH CORONARY ANGIOGRAM N/A 06/29/2012   Procedure: LEFT HEART CATHETERIZATION WITH CORONARY ANGIOGRAM;  Surgeon: Peter M Martinique, MD;  Location: [facility] CATH LAB;  Service: Cardiovascular;  Laterality: N/A;  . STERNAL WOUND DEBRIDEMENT  08/17/2012   Procedure: STERNAL WOUND DEBRIDEMENT;  Surgeon: Ivin Poot, MD;  Location: [facility] OR;  Service: Thoracic;  Laterality: N/A;  wound vac application  . STERNAL WOUND DEBRIDEMENT  08/24/2012    Procedure: STERNAL WOUND DEBRIDEMENT;  Surgeon: Ivin Poot, MD;  Location: [place];  Service: Thoracic;  Laterality: N/A;  . STERNAL WOUND DEBRIDEMENT  09/01/2012   Procedure: STERNAL WOUND DEBRIDEMENT;  Surgeon: Ivin Poot, MD;  Location: [place];  Service: Thoracic;  Laterality: N/A;  . STERNAL WOUND DEBRIDEMENT  09/20/2012   Procedure: STERNAL WOUND DEBRIDEMENT;  Surgeon: Ivin Poot, MD;  Location: [facility];  Service: Thoracic;  Laterality: N/A;  wound vac change  . SUBMUCOSAL TATTOO INJECTION  10/07/2019   Procedure: SUBMUCOSAL TATTOO INJECTION;  Surgeon: Jackquline Denmark, MD;  Location: [facility] ENDOSCOPY;  Service: Endoscopy;;     reports that she has been smoking cigarettes. She has a 12.50 pack-year smoking history. She has never used smokeless tobacco. She reports current drug use. Drug: Cocaine. She reports that she does not drink alcohol.  Allergies  Allergen Reactions  . Naproxen Rash    Family History  Problem Relation Age of Onset  . Diabetes Mother   . Hypertension Mother   . Cancer Mother   . Hyperlipidemia Father   .  Hypertension Father   . Kidney disease Father   . Gout Father   . Cerebrovascular Accident Father   . Other Other        no known family CAD     Prior to Admission medications   Medication Sig Start Date End Date Taking? Authorizing Provider  allopurinol (ZYLOPRIM) 100 MG tablet Take 100 mg by mouth 2 (two) times daily.  11/25/17  Yes [provider]  aspirin EC 325 MG tablet Take 325 mg by mouth daily. 10/28/19  Yes [provider]  buPROPion (WELLBUTRIN SR) 150 MG 12 hr tablet Take 1 tablet (150 mg total) by mouth 2 (two) times daily. 01/16/16  Yes Mikhail, Velta Addison, DO  cetirizine (ZYRTEC) 10 MG tablet Take 10 mg by mouth daily as needed for allergies.    Yes [provider]  ferrous sulfate 325 (65 FE) MG tablet Take 325 mg by mouth 2 (two) times daily with a meal.    Yes [provider]  gabapentin (NEURONTIN) 300  MG capsule Take 1 capsule (300 mg total) by mouth daily. 12/12/19 01/11/20 Yes Domenic Polite, MD  insulin aspart (NOVOLOG) 100 UNIT/ML injection Inject 0-15 Units into the skin 3 (three) times daily with meals. Patient taking differently: Inject 2 Units into the skin 3 (three) times daily with meals.  10/11/19  Yes Nicole Kindred A, DO  ipratropium (ATROVENT HFA) 17 MCG/ACT inhaler Inhale 2 puffs into the lungs every 6 (six) hours as needed for wheezing.   Yes [provider]  LINZESS 72 MCG capsule Take 72 mcg by mouth daily before breakfast.  11/21/17  Yes [provider]  metoprolol tartrate (LOPRESSOR) 25 MG tablet Take 0.5 tablets (12.5 mg total) by mouth 2 (two) times daily. Patient taking differently: Take 25 mg by mouth 2 (two) times daily.  10/11/19  Yes Nicole Kindred A, DO  nitroGLYCERIN (NITROSTAT) 0.4 MG SL tablet Place 1 tablet (0.4 mg total) under the tongue every 5 (five) minutes as needed for chest pain. 04/30/19  Yes [name], MD  pantoprazole (PROTONIX) 40 MG tablet Take 1 tablet (40 mg total) by mouth daily. 08/26/19  Yes Jeanmarie Hubert, MD  polyethylene glycol (MIRALAX / GLYCOLAX) 17 g packet Take 17 g by mouth daily as needed for mild constipation.    Yes [provider]  pravastatin (PRAVACHOL) 40 MG tablet Take 40 mg by mouth daily. 10/28/19  Yes [provider]  VELPHORO 500 MG chewable tablet Chew 500 mg by mouth 3 (three) times daily with meals. 11/22/19  Yes [provider]  brinzolamide (AZOPT) 1 % ophthalmic suspension Place 1 drop into both eyes 2 (two) times a day.     [provider]  latanoprost (XALATAN) 0.005 % ophthalmic solution Place 1 drop into both eyes at bedtime.    [provider]  mometasone-formoterol (DULERA) 100-5 MCG/ACT AERO Inhale 2 puffs into the lungs every 4 (four) hours as needed for wheezing.    [provider]  predniSONE (DELTASONE) 20 MG tablet Take 3 tablets (60 mg  total) by mouth daily. 01/01/20   Ward, Delice Bison, DO  PROAIR HFA 108 (90 Base) MCG/ACT inhaler Inhale 2 puffs into the lungs every 6 (six) hours as needed for wheezing.  11/17/17   [provider]  Grant Ruts INHUB 250-50 MCG/DOSE AEPB Inhale 2 puffs into the lungs daily.  05/04/19   [provider]    Physical Exam: Vitals:   01/01/20 1115 01/01/20 1130 01/01/20 1145 01/01/20  1200  BP: (!) 112/54 (!) 104/57 124/63 133/65  Pulse: 83 82 86 87  Resp: 19 17 16 16   Temp:      TempSrc:      SpO2: 98% 97% 99% 98%  Weight:        Constitutional: NAD, calm, comfortable Vitals:   01/01/20 1115 01/01/20 1130 01/01/20 1145 01/01/20 1200  BP: (!) 112/54 (!) 104/57 124/63 133/65  Pulse: 83 82 86 87  Resp: 19 17 16 16   Temp:      TempSrc:      SpO2: 98% 97% 99% 98%  Weight:       Eyes: PERRL, lids and conjunctivae normal ENMT: Mucous membranes are moist. Posterior pharynx clear of any exudate or lesions.Normal dentition.  Neck: normal, supple, no masses, no thyromegaly Respiratory: Some scattered crackles bilaterally no wheezing. Normal respiratory effort. No accessory muscle use.  Cardiovascular: Regular rate and rhythm, no murmurs / rubs / gallops. No extremity edema. 2+ pedal pulses. No carotid bruits.  Abdomen: no tenderness, no masses palpated. No hepatosplenomegaly. Bowel sounds positive.  Musculoskeletal: no clubbing / cyanosis. No joint deformity upper and lower extremities. Good ROM, no contractures. Normal muscle tone.  Skin: no rashes, lesions, ulcers. No induration Neurologic: No facial droop, moving all 4 limbs. But very sleepy Psychiatric: Respond to voice, follow simple command. Normal mood.     Labs on Admission: I have personally reviewed following labs and imaging studies  CBC: Recent Labs  Lab 01/01/20 0600 01/01/20 0614  WBC 10.6*  --   NEUTROABS 9.5*  --   HGB 8.5* 10.2*  HCT 29.5* 30.0*  MCV 90.2  --   PLT 350  --    Basic Metabolic  Panel: Recent Labs  Lab 01/01/20 0600 01/01/20 0614  NA 140 142  K 4.9 4.8  CL 109  --   CO2 18*  --   GLUCOSE 308*  --   BUN 68*  --   CREATININE 7.57*  --   CALCIUM 7.5*  --    GFR: Estimated Creatinine Clearance: 9.6 mL/min (A) (by C-G formula based on SCr of 7.57 mg/dL (H)). Liver Function Tests: No results for input(s): AST, ALT, ALKPHOS, BILITOT, PROT, ALBUMIN in the last 168 hours. No results for input(s): LIPASE, AMYLASE in the last 168 hours. No results for input(s): AMMONIA in the last 168 hours. Coagulation Profile: No results for input(s): INR, PROTIME in the last 168 hours. Cardiac Enzymes: No results for input(s): CKTOTAL, CKMB, CKMBINDEX, TROPONINI in the last 168 hours. BNP (last 3 results) No results for input(s): PROBNP in the last 8760 hours. HbA1C: No results for input(s): HGBA1C in the last 72 hours. CBG: No results for input(s): GLUCAP in the last 168 hours. Lipid Profile: No results for input(s): CHOL, HDL, LDLCALC, TRIG, CHOLHDL, LDLDIRECT in the last 72 hours. Thyroid Function Tests: No results for input(s): TSH, T4TOTAL, FREET4, T3FREE, THYROIDAB in the last 72 hours. Anemia Panel: No results for input(s): VITAMINB12, FOLATE, FERRITIN, TIBC, IRON, RETICCTPCT in the last 72 hours. Urine analysis:    Component Value Date/Time   COLORURINE YELLOW 08/19/2019 0712   APPEARANCEUR HAZY (A) 08/19/2019 0712   LABSPEC 1.012 08/19/2019 0712   PHURINE 5.0 08/19/2019 0712   GLUCOSEU NEGATIVE 08/19/2019 0712   HGBUR NEGATIVE 08/19/2019 0712   BILIRUBINUR NEGATIVE 08/19/2019 0712   KETONESUR NEGATIVE 08/19/2019 0712   PROTEINUR NEGATIVE 08/19/2019 0712   UROBILINOGEN 0.2 05/12/2015 0938   NITRITE NEGATIVE 08/19/2019 0712   LEUKOCYTESUR SMALL (  A) 08/19/2019 0712    Radiological Exams on Admission: DG Chest Portable 1 View  Result Date: 01/01/2020 CLINICAL DATA:  Shortness of breath. Additional history provided: Wheezing, missed dialysis EXAM: PORTABLE  CHEST 1 VIEW COMPARISON:  Chest radiograph 10/04/2019 FINDINGS: Unchanged position of a right IJ approach central venous catheter. Prior median sternotomy. Cardiomegaly is unchanged. Bilateral interstitial pulmonary opacities. No sizable pleural effusion or evidence of pneumothorax. No acute bony abnormality. Overlying cardiac monitoring leads. IMPRESSION: Unchanged cardiomegaly. Bilateral interstitial pulmonary opacities are nonspecific but may reflect edema or atypical/viral pneumonia. Electronically Signed   By: Kellie Simmering DO   On: 01/01/2020 06:24    EKG: Independently reviewed.   Assessment/Plan Active Problems:   AMS (altered mental status)  Acute metabolic encephalopathy, however a formal recommendation of uremia monitor metabolic acidosis non-anion gap from missing dialysis.  ABG showed pH 7.26, no symptoms CO2 retention arguing against COPD exacerbation, ordered bicarb pills.  Nephrology will see patient in the ED but initial impression was no emergency dialysis needed for today.  This was communicated with patient's aunt over the phone who understand and agreed with the plan. GCS=13.  No focal neuro deficit.  Consider CT of the head if no significant improvement of mental status.  Acute hypoxic respite failure, poorly patient was hypoxic with EMS but that resolved during ER stay patient did receive COPD meds including Solu-Medrol, clinically however patient currently has no wheezing and her ABG showed no significant CO2 retention seems arguing against COPD exacerbation, contiue her regular home COPD meds.  Elevated trop, probably from worsening of kidney function, pt does not complains about chest pain and EKG re-assuring, will repeat one set.  ESRD, as above.  Chronic iron deficiency anemia, on iron supplement.  IDDM, sliding scale for now.  Hypertension, continue metoprolol.  Diabetic neuropathy, continue renal dosed gabapentin.   DVT prophylaxis: Heparin subcu Code Status:  Full Family Communication: Rosalin Hawking over phone Disposition Plan: Home Consults called: Nephro Admission status: Tele obs   Lequita Halt MD [facility] Pager [phone]  If 7PM-7AM, please contact night-coverage [email or web address] Password [facility]  01/01/2020, 12:47 PM

## 2020-01-01 NOTE — ED Notes (Signed)
Pt ambulated with assistance from [facility] and this RN. Patient maintained O2 sats at 96% or above for entirety.

## 2020-01-01 NOTE — ED Notes (Signed)
Pt unable to follow instructions to ambulate at the moment. Sarah RN notified.

## 2020-01-01 NOTE — ED Triage Notes (Signed)
Pt transported from home by [facility] for shob, wheezing noted on scene, Solumedrol 125mg , MG 2gm, Albuterol 5mg , pt states she feels better after interventions. 18 R AC. EMS reports RA sat of 70%. Pt is TTHS HD, pt states she missed 2 treatments d/t transportation issues.

## 2020-01-01 NOTE — Progress Notes (Signed)
O2 turned off at this time to see how pt tolerates. SpO2 100% on RA.

## 2020-01-02 ENCOUNTER — Other Ambulatory Visit: Payer: Self-pay | Admitting: Radiology

## 2020-01-02 DIAGNOSIS — J9601 Acute respiratory failure with hypoxia: Secondary | ICD-10-CM | POA: Diagnosis not present

## 2020-01-02 DIAGNOSIS — G9340 Encephalopathy, unspecified: Secondary | ICD-10-CM

## 2020-01-02 LAB — BASIC METABOLIC PANEL
Anion gap: 11 (ref 5–15)
BUN: 49 mg/dL — ABNORMAL HIGH (ref 8–23)
CO2: 26 mmol/L (ref 22–32)
Calcium: 7.8 mg/dL — ABNORMAL LOW (ref 8.9–10.3)
Chloride: 105 mmol/L (ref 98–111)
Creatinine, Ser: 5.6 mg/dL — ABNORMAL HIGH (ref 0.44–1.00)
GFR calc Af Amer: 8 mL/min — ABNORMAL LOW (ref >60)
GFR calc non Af Amer: 7 mL/min — ABNORMAL LOW (ref >60)
Glucose, Bld: 134 mg/dL — ABNORMAL HIGH (ref 70–99)
Potassium: 4.6 mmol/L (ref 3.5–5.1)
Sodium: 142 mmol/L (ref 135–145)

## 2020-01-02 LAB — CBC
HCT: 25.5 % — ABNORMAL LOW (ref 36.0–46.0)
Hemoglobin: 7.7 g/dL — ABNORMAL LOW (ref 12.0–15.0)
MCH: 26.2 pg (ref 26.0–34.0)
MCHC: 30.2 g/dL (ref 30.0–36.0)
MCV: 86.7 fL (ref 80.0–100.0)
Platelets: 296 10*3/uL (ref 150–400)
RBC: 2.94 MIL/uL — ABNORMAL LOW (ref 3.87–5.11)
RDW: 19.5 % — ABNORMAL HIGH (ref 11.5–15.5)
WBC: 10.3 10*3/uL (ref 4.0–10.5)
nRBC: 0.5 % — ABNORMAL HIGH (ref 0.0–0.2)

## 2020-01-02 LAB — HEMOGLOBIN A1C
Hgb A1c MFr Bld: 5 % (ref 4.8–5.6)
Mean Plasma Glucose: 97 mg/dL

## 2020-01-02 LAB — GLUCOSE, CAPILLARY
Glucose-Capillary: 118 mg/dL — ABNORMAL HIGH (ref 70–99)
Glucose-Capillary: 197 mg/dL — ABNORMAL HIGH (ref 70–99)

## 2020-01-02 MED ORDER — SODIUM BICARBONATE 650 MG PO TABS: 650.0000 mg | ORAL_TABLET | Freq: Three times a day (TID) | ORAL | 0 refills | Status: DC

## 2020-01-02 NOTE — Progress Notes (Addendum)
Subjective:  No cos , eating lunch , for dc today   Objective Vital signs in last 24 hours: Vitals:   01/01/20 1915 01/01/20 2257 01/01/20 2329 01/02/20 0713  BP: 131/69 107/78 (!) 114/56 119/62  Pulse: 83 88 81 76  Resp:   20 19  Temp:  98.3 F (36.8 C) 98 F (36.7 C) 98.2 F (36.8 C)  TempSrc:  Oral  Oral  SpO2: 92% 97% 100% 99%  Weight:      Height:  5' 8.5" (1.74 m)     Weight change: 0 kg  Physical Exam: General: obese female sitting up eating lunch ,nad , ox3  Heart: RRR , no m,r,g  Lungs: Decr at bases , otherwise CTA , non labored  Abdomen:  Soft  NT, ND  Extremities: trace bipedal edema  Dialysis Access: R IJ Pcath  / LAVF  + bruit      Dialysis Orders: [facility] T,Th,S 4 hours 15 min 180NRe 400/800 119.5 kg 2.0 K/ 2.0 Ca TDC/AVF -No heparin -Mircera 225 mcg IV q 2 weeks (last dose 12/29/2019 Last HGB 8.4 12/13/2019) -Venofer 100 mg IV X 8 doses (3/8 doses given last dose 12/29/2019 Fe 57 Tsat 22 12/13/2019) -Calcitriol 1.5 mcg PO TIW (last PTH 1008 12/13/2019)  Problem/Plan: 1.  Acute Hypoxic Respiratory failure/Volume Overload: HD yest  2.5 l uf  Below edw at 118 kg , no sob  Next hd tomor as op , ok for dc from renal view  Lower edw   2. Mild troponin elevation-ESRD pt with  demand ischemia. Per primary 3.  Acute encephalopathy: alert and  Ms ok this am  / probable  Uremia, with some dementia and Possible OSA/Co2 retention.  4.  ESRD -  T,Th,S via TDC.  No heparin. Next hd tomor op center   5.  Hypertension/volume  - BP  Ok now and lower edw as above / Metoprolol 12.5 mg PO BID on OP   6.  Anemia  - HGB 8.5.> 7.7  Recent max ESA dose. Continue Fe load. Follow HGB as op  7.  Metabolic bone disease - Continue binders, VDRA when able to eat. Give hectorol 4 mcg IV with HD today.  8.  Nutrition - 2.9 alb eating all lunch / fu op labs  And supplement as needed    [name], PA-C [facility]per [phone] 01/02/2020,11:15 AM  LOS: 0 days    Labs: Basic Metabolic Panel: Recent Labs  Lab 01/01/20 0600 01/01/20 0614 01/02/20 0440  NA 140 142 142  K 4.9 4.8 4.6  CL 109  --  105  CO2 18*  --  26  GLUCOSE 308*  --  134*  BUN 68*  --  49*  CREATININE 7.57*  --  5.60*  CALCIUM 7.5*  --  7.8*   Liver Function Tests: No results for input(s): AST, ALT, ALKPHOS, BILITOT, PROT, ALBUMIN in the last 168 hours. No results for input(s): LIPASE, AMYLASE in the last 168 hours. No results for input(s): AMMONIA in the last 168 hours. CBC: Recent Labs  Lab 01/01/20 0600 01/01/20 0614 01/01/20 1735 01/02/20 0440  WBC 10.6*  --  8.7 10.3  NEUTROABS 9.5*  --   --   --   HGB 8.5* 10.2* 8.2* 7.7*  HCT 29.5* 30.0* 28.6* 25.5*  MCV 90.2  --  89.1 86.7  PLT 350  --  355 296   Cardiac Enzymes: No results for input(s): CKTOTAL, CKMB, CKMBINDEX, TROPONINI in the last  168 hours. CBG: Recent Labs  Lab 01/01/20 2202 01/02/20 0710  GLUCAP 246* 118*    Studies/Results: DG Chest Portable 1 View  Result Date: 01/01/2020 CLINICAL DATA:  Shortness of breath. Additional history provided: Wheezing, missed dialysis EXAM: PORTABLE CHEST 1 VIEW COMPARISON:  Chest radiograph 10/04/2019 FINDINGS: Unchanged position of a right IJ approach central venous catheter. Prior median sternotomy. Cardiomegaly is unchanged. Bilateral interstitial pulmonary opacities. No sizable pleural effusion or evidence of pneumothorax. No acute bony abnormality. Overlying cardiac monitoring leads. IMPRESSION: Unchanged cardiomegaly. Bilateral interstitial pulmonary opacities are nonspecific but may reflect edema or atypical/viral pneumonia. Electronically Signed   By: Kellie Simmering DO   On: 01/01/2020 06:24   Medications: . sodium chloride    . sodium chloride     . allopurinol  100 mg Oral BID  . aspirin EC  325 mg Oral Daily  . brinzolamide  1 drop Both Eyes TID  . buPROPion  150 mg Oral BID  . [START ON 01/03/2020] calcitRIOL  1.5 mcg Oral Q T,Th,Sa-HD  .  Chlorhexidine Gluconate Cloth  6 each Topical Q0600  . doxercalciferol  4 mcg Intravenous Once  . ferrous sulfate  325 mg Oral BID WC  . gabapentin  300 mg Oral Daily  . heparin  5,000 Units Subcutaneous Q8H  . insulin aspart  0-9 Units Subcutaneous TID WC  . latanoprost  1 drop Both Eyes QHS  . linaclotide  72 mcg Oral QAC breakfast  . loratadine  10 mg Oral Daily  . metoprolol tartrate  12.5 mg Oral BID  . mometasone-formoterol  2 puff Inhalation BID  . pantoprazole  40 mg Oral Daily  . pravastatin  40 mg Oral Daily  . sodium bicarbonate  650 mg Oral TID  . sucroferric oxyhydroxide  500 mg Oral TID [facility]   Nephrology attending: Patient was seen and examined at bedside.  Chart reviewed.  I agree with assessment and plan as outlined above. ESRD on HD, TTS.  Plan for next HD tomorrow.  Noted patient is being discharged.  Seems like hypoxia has resolved.  Need to adjust dry weight.  Continue iron and ESA for anemia.  Discontinue oral sodium bicarbonate.  Lawson Radar, MD [facility]

## 2020-01-02 NOTE — Progress Notes (Signed)
Patient place on telemetry. Electrodes available. No issues related to delay in telemetry placement.

## 2020-01-02 NOTE — Evaluation (Signed)
Physical Therapy Evaluation Patient Details Name: [name] [name] MRN: [id number] DOB: February 01, 1951 Today's Date: 01/02/2020   History of Present Illness  Patient is a 69 y/o female admitted with hypoxia and SOB.  PMH to include DM, CAD s/p CABG, CKD, HF, CVA, ESRD on HD TTS.  Clinical Impression  Pt is a pleasant 69 yo female presenting today with above diagnosis, agreeable to PT evaluation this morning. The pt presents with limited endurance for functional mobility (pt required 2 seated rest breaks during 250 ft ambulation with RW), but reports this is similar to how she was moving prior to admission. The pt was able to sustain SpO2 > 97% at rest and during ambulation on RA, even with navigation of 6 stairs. The pt will continue to benefit from skilled PT to maximize independence and endurance training to increase participation. The pt reports she was receiving HHPT prior to admission, I recommend she continue with HHPT upon d/c.     Follow Up Recommendations Home health PT;Supervision for mobility/OOB    Equipment Recommendations  (pt has needed equipment)    Recommendations for Other Services       Precautions / Restrictions Precautions Precautions: Fall Restrictions Weight Bearing Restrictions: No      Mobility  Bed Mobility Overal bed mobility: Needs Assistance Bed Mobility: Supine to Sit;Sit to Supine     Supine to sit: Supervision;HOB elevated Sit to supine: Supervision   General bed mobility comments: Pt able to move to sitting EOB without assist, uses elevated HOB, but states she has hospital bed at home  Transfers Overall transfer level: Needs assistance Equipment used: Rolling walker (2 wheeled) Transfers: Sit to/from Stand Sit to Stand: Supervision         General transfer comment: S for safety, good technique following VCs for hand placement. Poor eccentric control when fatigued  Ambulation/Gait Ambulation/Gait assistance: Min guard Gait Distance (Feet): 75  Feet(x2 and 100 x1) Assistive device: Rolling walker (2 wheeled) Gait Pattern/deviations: Step-to pattern;Step-through pattern;Decreased stride length;Trunk flexed Gait velocity: 0.26 m/s Gait velocity interpretation: <1.31 ft/sec, indicative of household ambulator General Gait Details: Pt with steady vitals, sig fatigue after ~75 ft requireing seated rest for ~1 min. Pt able to walk in hall to stairs and back (~250 ft total) but required 2 seated rest breaks.  Stairs Stairs: Yes Stairs assistance: Min guard Stair Management: Two rails;Step to pattern;Forwards Number of Stairs: 6 General stair comments: Pt able to demo good safety on stairs, slow and steady, will benefit from supervision as these fatigue her greatly  Wheelchair Mobility    Modified Rankin (Stroke Patients Only)       Balance Overall balance assessment: Needs assistance Sitting-balance support: Feet supported Sitting balance-Leahy Scale: Good Sitting balance - Comments: mod I   Standing balance support: Bilateral upper extremity supported;During functional activity Standing balance-Leahy Scale: Fair Standing balance comment: benefits from B UE support due to fatigue and limited endurance                             Pertinent Vitals/Pain Pain Assessment: 0-10 Pain Score: 6  Pain Location: L leg/hip Pain Descriptors / Indicators: Aching;Grimacing;Guarding Pain Intervention(s): Limited activity within patient's tolerance;Monitored during session;Repositioned    Home Living Family/patient expects to be discharged to:: Private residence Living Arrangements: Spouse/significant other Available Help at Discharge: Available 24 hours/day;Family Type of Home: Apartment Home Access: Stairs to enter Entrance Stairs-Rails: Can reach both Entrance Stairs-Number of Steps: 13 (  2 sets of 6) Home Layout: One level Home Equipment: Shower seat;Grab bars - tub/shower;Cane - quad Additional Comments: 14 steps to  enter home    Prior Function Level of Independence: Needs assistance   Gait / Transfers Assistance Needed: amb with cane, recent frequent falls; reports using her "walker" which is quad cane but legs lock up and she falls  ADL's / Homemaking Assistance Needed: Boyfriend assists with all shopping needs, transportation to appointments  Comments: intermittent use of SPC     Hand Dominance   Dominant Hand: Right    Extremity/Trunk Assessment   Upper Extremity Assessment Upper Extremity Assessment: Overall WFL for tasks assessed    Lower Extremity Assessment Lower Extremity Assessment: Generalized weakness(Pt with functional abilities but is significantly limited due to generalized weakness. able to complete ambulation and stairs, but requires seated rest break with sig rest)    Cervical / Trunk Assessment Cervical / Trunk Assessment: Normal  Communication   Communication: No difficulties  Cognition Arousal/Alertness: Awake/alert Behavior During Therapy: WFL for tasks assessed/performed Overall Cognitive Status: Within Functional Limits for tasks assessed                                 General Comments: very energetic and pleasant today      General Comments      Exercises     Assessment/Plan    PT Assessment Patient needs continued PT services  PT Problem List Decreased strength;Decreased activity tolerance;Decreased balance;Decreased mobility;Decreased knowledge of precautions;Decreased safety awareness       PT Treatment Interventions DME instruction;Stair training;Therapeutic activities;Balance training;Gait training;Functional mobility training;Therapeutic exercise;Patient/family education    PT Goals (Current goals can be found in the Care Plan section)  Acute Rehab PT Goals Patient Stated Goal: to go home PT Goal Formulation: With patient Time For Goal Achievement: 12/21/19 Potential to Achieve Goals: Good    Frequency Min 3X/week    Barriers to discharge        Co-evaluation               AM-PAC PT "6 Clicks" Mobility  Outcome Measure Help needed turning from your back to your side while in a flat bed without using bedrails?: None Help needed moving from lying on your back to sitting on the side of a flat bed without using bedrails?: None Help needed moving to and from a bed to a chair (including a wheelchair)?: A Little Help needed standing up from a chair using your arms (e.g., wheelchair or bedside chair)?: None Help needed to walk in hospital room?: A Little Help needed climbing 3-5 steps with a railing? : A Little 6 Click Score: 21    End of Session Equipment Utilized During Treatment: Gait belt Activity Tolerance: Patient tolerated treatment well Patient left: in bed;with call bell/phone within reach Nurse Communication: Mobility status(d/c plan, ability to return home with HHPT) PT Visit Diagnosis: Other abnormalities of gait and mobility (R26.89);Difficulty in walking, not elsewhere classified (R26.2)    Time: 5009-3818 PT Time Calculation (min) (ACUTE ONLY): 26 min   Charges:   PT Evaluation $PT Eval Low Complexity: 1 Low PT Treatments $Gait Training: 8-22 mins        [name] [name], PT, DPT   Acute Rehabilitation Department [phone]  [name] [name] 01/02/2020, 1:30 PM

## 2020-01-02 NOTE — Progress Notes (Signed)
Patient has order for cardiac monitoring but we run out of electrodes  at the hospital. Patient is not wearing her monitor now. [place] NP notified without any new order. Will continue to monitor.

## 2020-01-02 NOTE — Progress Notes (Signed)
The patient showed a tremendous use of spirituality for coping with health challenges.  The patient expressed some difficulties with health over the past year but also talked about how they refused to be in a bad mood because they are in the hospital.  The chaplain visited for the purpose of changing an AD.  The patient acknowledged that they wanted to update their AD because their current living will did not reflect their current desires.  The chaplain provided paperwork for the patient and instructed them to contact the office of spiritual care when the paperwork is complete.  [name] [name] Chaplain Resident For questions concerning this note please contact me by pager [phone]

## 2020-01-02 NOTE — TOC Transition Note (Signed)
Transition of Care [facility]) - CM/SW Discharge Note   Patient Details  Name: [name] [name] MRN: [id number] Date of Birth: 12-05-1951  [facility] of [facility] CM/SW Contact:  Pollie Friar, RN Phone Number: 01/02/2020, 11:46 AM   Clinical Narrative:    Pt discharging home with self care. Pt has hospital f/u, insurance and transportation home.   Final next level of care: Home/Self Care Barriers to Discharge: No Barriers Identified   Patient Goals and CMS Choice        Discharge Placement                       Discharge Plan and Services                                     Social Determinants of Health (SDOH) Interventions     Readmission Risk Interventions Readmission Risk Prevention Plan 12/12/2019 10/08/2019 08/26/2019  Transportation Screening Complete - Complete  Medication Review [employer] photographer) Complete - Complete  PCP or Specialist appointment within 3-5 days of discharge Complete - Complete  HRI or [place] - - Complete  SW Recovery Care/Counseling Consult - - Complete  City of [place] - - Not Applicable  Some recent data might be hidden

## 2020-01-02 NOTE — Discharge Summary (Signed)
Physician Discharge Summary  OVAL MORALEZ ALP:[id number] DOB: [birth year] DOA: 01/01/2020  PCP: [place] date: 01/01/2020 Discharge date: 01/02/2020  Time spent: 45 minutes  Recommendations for Outpatient Follow-up:  1. Resume regular dialysis schedule 2. Follow up with PCP 2-3 weeks for evaluation of symptoms     Discharge Diagnoses:  Principal Problem:   Acute encephalopathy Active Problems:   ESRD (end stage renal disease) (HCC)   Elevated troponin   Acute pulmonary edema (HCC)   Essential hypertension   Coronary artery disease   Type 2 diabetes mellitus with chronic kidney disease on chronic dialysis, with long-term current use of insulin ([place])   Discharge Condition: stable  Diet recommendation: carb modified heart healthy renal  Filed Weights   01/01/20 0611 01/01/20 1600  Weight: 118 kg 118 kg    History of present illness:  [name] [name] is a 69 y.o. female with medical history significant of chronic diastolic heart failure, end-stage renal disease on hemodialysis TTS, hypertension, asthma who presented to the ED 01/01/20 with shortness of breath. Patient was reportedly hypoxic with EMS but had been on room air upon arrival to the ED. She received albuterol, Solu-Medrol, magnesium and felt improved. Her last dialysis was on Tuesday. She was scheduled to go to dialysis 1/2 instead came to ED because of worsening of short of breath.  Hospital Course:  Acute metabolic encephalopathy. Resolved at discharge. Likely related to missing dialysis and uremia with metabolic acidosis non-anion gap.  ABG showed pH 7.26, no symptoms CO2 retention arguing against COPD exacerbation, ordered bicarb pills.  she underwent dialysis and encephalopathy resolved.  No focal neuro deficit.  Acute hypoxic respite failure, reportedly patient was hypoxic with EMS but that resolved during ER stay patient did receive COPD meds including Solu-Medrol, clinically  however patient had no wheezing and her ABG showed no significant CO2 retention seems arguing against COPD exacerbation, contiue her regular home COPD meds.  Elevated trop, probably from worsening of kidney function. No chest pain.  EKG re-assuring.  ESRD, as above.  Chronic iron deficiency anemia, on iron supplement.  IDDM, sliding scale for now.  Hypertension, continue metoprolol.  Diabetic neuropathy, continue renal dosed gabapentin.    Procedures:  Dialysis 01/01/20  Consultations:  [facility]  Discharge Exam: Vitals:   01/01/20 2329 01/02/20 0713  BP: (!) 114/56 119/62  Pulse: 81 76  Resp: 20 19  Temp: 98 F (36.7 C) 98.2 F (36.8 C)  SpO2: 100% 99%    General: sitting on side of bed eating no acute distress Cardiovascular: rrr no mgr no LE edema Respiratory: normal effort BS clear bilaterally no wheeze  Discharge Instructions   Discharge Instructions    Call MD for:  difficulty breathing, headache or visual disturbances   Complete by: As directed    Call MD for:  persistant dizziness or light-headedness   Complete by: As directed    Call MD for:  persistant nausea and vomiting   Complete by: As directed    Call MD for:  temperature >100.4   Complete by: As directed    Diet - low sodium heart healthy   Complete by: As directed    Discharge instructions   Complete by: As directed    Resume dialysis schedule   Increase activity slowly   Complete by: As directed      Allergies as of 01/02/2020      Reactions   Naproxen Rash  Medication List    STOP taking these medications   predniSONE 20 MG tablet Commonly known as: DELTASONE     TAKE these medications   allopurinol 100 MG tablet Commonly known as: ZYLOPRIM Take 100 mg by mouth 2 (two) times daily.   aspirin EC 325 MG tablet Take 325 mg by mouth daily.   brinzolamide 1 % ophthalmic suspension Commonly known as: AZOPT Place 1 drop into both eyes 2 (two) times a  day.   buPROPion 150 MG 12 hr tablet Commonly known as: WELLBUTRIN SR Take 1 tablet (150 mg total) by mouth 2 (two) times daily.   cetirizine 10 MG tablet Commonly known as: ZYRTEC Take 10 mg by mouth daily as needed for allergies.   ferrous sulfate 325 (65 FE) MG tablet Take 325 mg by mouth 2 (two) times daily with a meal.   gabapentin 300 MG capsule Commonly known as: NEURONTIN Take 1 capsule (300 mg total) by mouth daily.   insulin aspart 100 UNIT/ML injection Commonly known as: novoLOG Inject 0-15 Units into the skin 3 (three) times daily with meals. What changed: how much to take   ipratropium 17 MCG/ACT inhaler Commonly known as: ATROVENT HFA Inhale 2 puffs into the lungs every 6 (six) hours as needed for wheezing.   latanoprost 0.005 % ophthalmic solution Commonly known as: XALATAN Place 1 drop into both eyes at bedtime.   Linzess 72 MCG capsule Generic drug: linaclotide Take 72 mcg by mouth daily before breakfast.   metoprolol tartrate 25 MG tablet Commonly known as: LOPRESSOR Take 0.5 tablets (12.5 mg total) by mouth 2 (two) times daily. What changed: how much to take   mometasone-formoterol 100-5 MCG/ACT Aero Commonly known as: DULERA Inhale 2 puffs into the lungs every 4 (four) hours as needed for wheezing.   nitroGLYCERIN 0.4 MG SL tablet Commonly known as: NITROSTAT Place 1 tablet (0.4 mg total) under the tongue every 5 (five) minutes as needed for chest pain.   pantoprazole 40 MG tablet Commonly known as: PROTONIX Take 1 tablet (40 mg total) by mouth daily.   polyethylene glycol 17 g packet Commonly known as: MIRALAX / GLYCOLAX Take 17 g by mouth daily as needed for mild constipation.   pravastatin 40 MG tablet Commonly known as: PRAVACHOL Take 40 mg by mouth daily.   ProAir HFA 108 (90 Base) MCG/ACT inhaler Generic drug: albuterol Inhale 2 puffs into the lungs every 6 (six) hours as needed for wheezing.   sodium bicarbonate 650 MG  tablet Take 1 tablet (650 mg total) by mouth 3 (three) times daily.   Velphoro 500 MG chewable tablet Generic drug: sucroferric oxyhydroxide Chew 500 mg by mouth 3 (three) times daily with meals.   Wixela Inhub 250-50 MCG/DOSE Aepb Generic drug: Fluticasone-Salmeterol Inhale 2 puffs into the lungs daily.      Allergies  Allergen Reactions  . Naproxen Rash      The results of significant diagnostics from this hospitalization (including imaging, microbiology, ancillary and laboratory) are listed below for reference.    Significant Diagnostic Studies: DG Chest Portable 1 View  Result Date: 01/01/2020 CLINICAL DATA:  Shortness of breath. Additional history provided: Wheezing, missed dialysis EXAM: PORTABLE CHEST 1 VIEW COMPARISON:  Chest radiograph 10/04/2019 FINDINGS: Unchanged position of a right IJ approach central venous catheter. Prior median sternotomy. Cardiomegaly is unchanged. Bilateral interstitial pulmonary opacities. No sizable pleural effusion or evidence of pneumothorax. No acute bony abnormality. Overlying cardiac monitoring leads. IMPRESSION: Unchanged cardiomegaly. Bilateral interstitial pulmonary opacities  are nonspecific but may reflect edema or atypical/viral pneumonia. Electronically Signed   By: Kellie Simmering DO   On: 01/01/2020 06:24    Microbiology: Recent Results (from the past 240 hour(s))  Respiratory Panel by RT PCR (Flu A&B, Covid) - Nasopharyngeal Swab     Status: None   Collection Time: 01/01/20 10:02 AM   Specimen: Nasopharyngeal Swab  Result Value Ref Range Status   SARS Coronavirus 2 by RT PCR NEGATIVE NEGATIVE Final    Comment: (NOTE) SARS-CoV-2 target nucleic acids are NOT DETECTED. The SARS-CoV-2 RNA is generally detectable in upper respiratoy specimens during the acute phase of infection. The lowest concentration of SARS-CoV-2 viral copies this assay can detect is 131 copies/mL. A negative result does not preclude SARS-Cov-2 infection and  should not be used as the sole basis for treatment or other patient management decisions. A negative result may occur with  improper specimen collection/handling, submission of specimen other than nasopharyngeal swab, presence of viral mutation(s) within the areas targeted by this assay, and inadequate number of viral copies (<131 copies/mL). A negative result must be combined with clinical observations, patient history, and epidemiological information. The expected result is Negative. Fact Sheet for Patients:  PinkCheek.be Fact Sheet for Healthcare Providers:  GravelBags.it This test is not yet ap proved or cleared by the [place] FDA and  has been authorized for detection and/or diagnosis of SARS-CoV-2 by FDA under an Emergency Use Authorization (EUA). This EUA will remain  in effect (meaning this test can be used) for the duration of the COVID-19 declaration under Section 564(b)(1) of the Act, 21 U.S.C. section 360bbb-3(b)(1), unless the authorization is terminated or revoked sooner.    Influenza A by PCR NEGATIVE NEGATIVE Final   Influenza B by PCR NEGATIVE NEGATIVE Final    Comment: (NOTE) The Xpert Xpress SARS-CoV-2/FLU/RSV assay is intended as an aid in  the diagnosis of influenza from Nasopharyngeal swab specimens and  should not be used as a sole basis for treatment. Nasal washings and  aspirates are unacceptable for Xpert Xpress SARS-CoV-2/FLU/RSV  testing. Fact Sheet for Patients: PinkCheek.be Fact Sheet for Healthcare Providers: GravelBags.it This test is not yet approved or cleared by the [place] FDA and  has been authorized for detection and/or diagnosis of SARS-CoV-2 by  FDA under an Emergency Use Authorization (EUA). This EUA will remain  in effect (meaning this test can be used) for the duration of the  Covid-19 declaration under Section  564(b)(1) of the Act, 21  U.S.C. section 360bbb-3(b)(1), unless the authorization is  terminated or revoked. Performed at [facility], [place] [address], [place],  [address]      Labs: Basic Metabolic Panel: Recent Labs  Lab 01/01/20 0600 01/01/20 0614 01/02/20 0440  NA 140 142 142  K 4.9 4.8 4.6  CL 109  --  105  CO2 18*  --  26  GLUCOSE 308*  --  134*  BUN 68*  --  49*  CREATININE 7.57*  --  5.60*  CALCIUM 7.5*  --  7.8*   Liver Function Tests: No results for input(s): AST, ALT, ALKPHOS, BILITOT, PROT, ALBUMIN in the last 168 hours. No results for input(s): LIPASE, AMYLASE in the last 168 hours. No results for input(s): AMMONIA in the last 168 hours. CBC: Recent Labs  Lab 01/01/20 0600 01/01/20 0614 01/01/20 1735 01/02/20 0440  WBC 10.6*  --  8.7 10.3  NEUTROABS 9.5*  --   --   --  HGB 8.5* 10.2* 8.2* 7.7*  HCT 29.5* 30.0* 28.6* 25.5*  MCV 90.2  --  89.1 86.7  PLT 350  --  355 296   Cardiac Enzymes: No results for input(s): CKTOTAL, CKMB, CKMBINDEX, TROPONINI in the last 168 hours. BNP: BNP (last 3 results) Recent Labs    06/01/19 2322 08/16/19 0934 01/01/20 0600  BNP 331.8* 250.5* 897.8*    ProBNP (last 3 results) No results for input(s): PROBNP in the last 8760 hours.  CBG: Recent Labs  Lab 01/01/20 2202 01/02/20 0710  GLUCAP 246* 118*       Signed:  Radene Gunning NP [facility] 01/02/2020, 11:31 AM

## 2020-01-03 ENCOUNTER — Inpatient Hospital Stay (HOSPITAL_COMMUNITY): Admission: RE | Admit: 2020-01-03 | Payer: Medicare Other | Source: Ambulatory Visit

## 2020-01-03 ENCOUNTER — Encounter (HOSPITAL_COMMUNITY): Payer: Self-pay

## 2020-01-19 ENCOUNTER — Other Ambulatory Visit: Payer: Self-pay

## 2020-01-19 DIAGNOSIS — Z992 Dependence on renal dialysis: Secondary | ICD-10-CM

## 2020-01-19 DIAGNOSIS — N186 End stage renal disease: Secondary | ICD-10-CM

## 2020-01-19 NOTE — Progress Notes (Signed)
POST OPERATIVE OFFICE NOTE    CC:  F/u for surgery  HPI:  This is a 69 y.o. female who is s/p Left arm brachiocephalic AV fistula creation and right TDC placement 08/25/19.  It seems she was lost to f/u.  She comes in today with complaint of pain and swelling with ecchymosis surrounding her left UE AV fistula.  Her fistula has been accessed twice for HD this week.    She denise pain, loss of motor and loss of sensation in the left hand.    Allergies  Allergen Reactions  . Naproxen Rash    Current Outpatient Medications  Medication Sig Dispense Refill  . allopurinol (ZYLOPRIM) 100 MG tablet Take 100 mg by mouth 2 (two) times daily.     Marland Kitchen aspirin EC 325 MG tablet Take 325 mg by mouth daily.    . brinzolamide (AZOPT) 1 % ophthalmic suspension Place 1 drop into both eyes 2 (two) times a day.     Marland Kitchen buPROPion (WELLBUTRIN SR) 150 MG 12 hr tablet Take 1 tablet (150 mg total) by mouth 2 (two) times daily. 60 tablet 0  . cetirizine (ZYRTEC) 10 MG tablet Take 10 mg by mouth daily as needed for allergies.     . ferrous sulfate 325 (65 FE) MG tablet Take 325 mg by mouth 2 (two) times daily with a meal.     . gabapentin (NEURONTIN) 300 MG capsule Take 1 capsule (300 mg total) by mouth daily.  0  . insulin aspart (NOVOLOG) 100 UNIT/ML injection Inject 0-15 Units into the skin 3 (three) times daily with meals. (Patient taking differently: Inject 2 Units into the skin 3 (three) times daily with meals. ) 10 mL 11  . ipratropium (ATROVENT HFA) 17 MCG/ACT inhaler Inhale 2 puffs into the lungs every 6 (six) hours as needed for wheezing.    . latanoprost (XALATAN) 0.005 % ophthalmic solution Place 1 drop into both eyes at bedtime.    Marland Kitchen LINZESS 72 MCG capsule Take 72 mcg by mouth daily before breakfast.   3  . metoprolol tartrate (LOPRESSOR) 25 MG tablet Take 0.5 tablets (12.5 mg total) by mouth 2 (two) times daily. (Patient taking differently: Take 25 mg by mouth 2 (two) times daily. ) 60 tablet 1  .  mometasone-formoterol (DULERA) 100-5 MCG/ACT AERO Inhale 2 puffs into the lungs every 4 (four) hours as needed for wheezing.    . nitroGLYCERIN (NITROSTAT) 0.4 MG SL tablet Place 1 tablet (0.4 mg total) under the tongue every 5 (five) minutes as needed for chest pain. 30 tablet 0  . pantoprazole (PROTONIX) 40 MG tablet Take 1 tablet (40 mg total) by mouth daily. 30 tablet 0  . polyethylene glycol (MIRALAX / GLYCOLAX) 17 g packet Take 17 g by mouth daily as needed for mild constipation.     . pravastatin (PRAVACHOL) 40 MG tablet Take 40 mg by mouth daily.    Marland Kitchen PROAIR HFA 108 (90 Base) MCG/ACT inhaler Inhale 2 puffs into the lungs every 6 (six) hours as needed for wheezing.   3  . sodium bicarbonate 650 MG tablet Take 1 tablet (650 mg total) by mouth 3 (three) times daily. 90 tablet 0  . VELPHORO 500 MG chewable tablet Chew 500 mg by mouth 3 (three) times daily with meals.    Grant Ruts INHUB 250-50 MCG/DOSE AEPB Inhale 2 puffs into the lungs daily.      No current facility-administered medications for this visit.     ROS:  See HPI  Physical Exam:    Findings: +--------------------+----------+-----------------+--------+ AVF                 PSV (cm/s)Flow Vol (mL/min)Comments +--------------------+----------+-----------------+--------+ Native artery inflow   375           252                +--------------------+----------+-----------------+--------+ AVF Anastomosis        522                              +--------------------+----------+-----------------+--------+    +------------+----------+-------------+----------+-----------------------------+ OUTFLOW VEINPSV (cm/s)Diameter (cm)Depth (cm)          Describe            +------------+----------+-------------+----------+-----------------------------+ Prox UA        146        0.79        1.16    branch 0.365 cm velocity at                                                    confluence 250cm/s        +------------+----------+-------------+----------+-----------------------------+ Mid UA          77        0.96        0.93                                 +------------+----------+-------------+----------+-----------------------------+ Dist UA        226        1.05        0.25          branch 0.595 cm        +------------+----------+-------------+----------+-----------------------------+ AC Fossa        65        1.30        0.34          branch 0.621 cm        +------------+----------+-------------+----------+-----------------------------+    Summary: 1. Patent left brachio-baslic BVT. 2. Large branches at the [facility] and distal outflow vein.     Incision:  Well healed AC incision, palpable distal thrill in fistula. 2 areas of firmness with tenderness to palpation surrounding infiltration areas over her fistula. Extremities:  Sensation and grip intact and equal B UE Lungs: non labored breathing   Assessment/Plan:  This is a 69 y.o. female who is s/p: Left arm brachiocephalic AV fistula creation and right TDC placement 08/25/19.  Left AV fistula with infiltration at HD.  I want her to rest her fistula for 2-3 weeks until she follows up.  He fistula   The fistula is maturing well with diameter > 0.79, but depth is > 0.93 mid to proximal fistula.  She will f/u in 2-3 weeks for re-evaluation.  She may need superficialization of her fistula for better HD access in the near future.    Roxy Horseman PA-C [facility] [phone]  Clinic MD:  Donzetta Matters

## 2020-01-20 ENCOUNTER — Ambulatory Visit (HOSPITAL_COMMUNITY)
Admission: RE | Admit: 2020-01-20 | Discharge: 2020-01-20 | Disposition: A | Discharge status: Home / Self Care | Payer: Medicare (Managed Care) | Source: Ambulatory Visit | Attending: Vascular Surgery | Admitting: Vascular Surgery

## 2020-01-20 ENCOUNTER — Ambulatory Visit (INDEPENDENT_AMBULATORY_CARE_PROVIDER_SITE_OTHER): Payer: Self-pay | Admitting: Physician Assistant

## 2020-01-20 ENCOUNTER — Other Ambulatory Visit: Payer: Self-pay

## 2020-01-20 VITALS — BP 129/55 | HR 63 | Temp 96.7°F | Resp 16 | Ht 68.5 in | Wt 268.0 lb

## 2020-01-20 DIAGNOSIS — N186 End stage renal disease: Secondary | ICD-10-CM

## 2020-01-20 DIAGNOSIS — Z992 Dependence on renal dialysis: Secondary | ICD-10-CM

## 2020-01-31 ENCOUNTER — Emergency Department (HOSPITAL_COMMUNITY): Payer: Medicare (Managed Care)

## 2020-01-31 ENCOUNTER — Inpatient Hospital Stay (HOSPITAL_COMMUNITY)
Admission: EM | Admit: 2020-01-31 | Discharge: 2020-02-10 | DRG: 270 | Disposition: A | Discharge status: Skilled Nursing Facility | Payer: Medicare (Managed Care) | Attending: Internal Medicine | Admitting: Internal Medicine

## 2020-01-31 ENCOUNTER — Inpatient Hospital Stay (HOSPITAL_COMMUNITY): Payer: Medicare (Managed Care)

## 2020-01-31 ENCOUNTER — Encounter (HOSPITAL_COMMUNITY): Payer: Self-pay | Admitting: Emergency Medicine

## 2020-01-31 ENCOUNTER — Other Ambulatory Visit: Payer: Self-pay

## 2020-01-31 DIAGNOSIS — I739 Peripheral vascular disease, unspecified: Secondary | ICD-10-CM | POA: Diagnosis not present

## 2020-01-31 DIAGNOSIS — R0603 Acute respiratory distress: Secondary | ICD-10-CM | POA: Diagnosis present

## 2020-01-31 DIAGNOSIS — Y832 Surgical operation with anastomosis, bypass or graft as the cause of abnormal reaction of the patient, or of later complication, without mention of misadventure at the time of the procedure: Secondary | ICD-10-CM | POA: Diagnosis present

## 2020-01-31 DIAGNOSIS — I10 Essential (primary) hypertension: Secondary | ICD-10-CM | POA: Diagnosis not present

## 2020-01-31 DIAGNOSIS — T82898A Other specified complication of vascular prosthetic devices, implants and grafts, initial encounter: Secondary | ICD-10-CM | POA: Diagnosis present

## 2020-01-31 DIAGNOSIS — E08621 Diabetes mellitus due to underlying condition with foot ulcer: Secondary | ICD-10-CM | POA: Diagnosis not present

## 2020-01-31 DIAGNOSIS — Z20822 Contact with and (suspected) exposure to covid-19: Secondary | ICD-10-CM | POA: Diagnosis present

## 2020-01-31 DIAGNOSIS — L97509 Non-pressure chronic ulcer of other part of unspecified foot with unspecified severity: Secondary | ICD-10-CM | POA: Diagnosis not present

## 2020-01-31 DIAGNOSIS — D631 Anemia in chronic kidney disease: Secondary | ICD-10-CM | POA: Diagnosis present

## 2020-01-31 DIAGNOSIS — Z886 Allergy status to analgesic agent status: Secondary | ICD-10-CM

## 2020-01-31 DIAGNOSIS — E1165 Type 2 diabetes mellitus with hyperglycemia: Secondary | ICD-10-CM | POA: Diagnosis not present

## 2020-01-31 DIAGNOSIS — I5042 Chronic combined systolic (congestive) and diastolic (congestive) heart failure: Secondary | ICD-10-CM | POA: Diagnosis present

## 2020-01-31 DIAGNOSIS — E11621 Type 2 diabetes mellitus with foot ulcer: Secondary | ICD-10-CM | POA: Diagnosis present

## 2020-01-31 DIAGNOSIS — R0902 Hypoxemia: Secondary | ICD-10-CM

## 2020-01-31 DIAGNOSIS — L03116 Cellulitis of left lower limb: Secondary | ICD-10-CM | POA: Diagnosis present

## 2020-01-31 DIAGNOSIS — I252 Old myocardial infarction: Secondary | ICD-10-CM | POA: Diagnosis not present

## 2020-01-31 DIAGNOSIS — Z7982 Long term (current) use of aspirin: Secondary | ICD-10-CM

## 2020-01-31 DIAGNOSIS — H409 Unspecified glaucoma: Secondary | ICD-10-CM | POA: Diagnosis present

## 2020-01-31 DIAGNOSIS — Z841 Family history of disorders of kidney and ureter: Secondary | ICD-10-CM

## 2020-01-31 DIAGNOSIS — R072 Precordial pain: Secondary | ICD-10-CM | POA: Diagnosis not present

## 2020-01-31 DIAGNOSIS — Y712 Prosthetic and other implants, materials and accessory cardiovascular devices associated with adverse incidents: Secondary | ICD-10-CM | POA: Diagnosis present

## 2020-01-31 DIAGNOSIS — R079 Chest pain, unspecified: Secondary | ICD-10-CM

## 2020-01-31 DIAGNOSIS — K219 Gastro-esophageal reflux disease without esophagitis: Secondary | ICD-10-CM | POA: Diagnosis present

## 2020-01-31 DIAGNOSIS — Y835 Amputation of limb(s) as the cause of abnormal reaction of the patient, or of later complication, without mention of misadventure at the time of the procedure: Secondary | ICD-10-CM | POA: Diagnosis not present

## 2020-01-31 DIAGNOSIS — E1152 Type 2 diabetes mellitus with diabetic peripheral angiopathy with gangrene: Secondary | ICD-10-CM | POA: Diagnosis present

## 2020-01-31 DIAGNOSIS — E8779 Other fluid overload: Secondary | ICD-10-CM | POA: Diagnosis present

## 2020-01-31 DIAGNOSIS — Y92230 Patient room in hospital as the place of occurrence of the external cause: Secondary | ICD-10-CM | POA: Diagnosis not present

## 2020-01-31 DIAGNOSIS — Z951 Presence of aortocoronary bypass graft: Secondary | ICD-10-CM | POA: Diagnosis not present

## 2020-01-31 DIAGNOSIS — Z8701 Personal history of pneumonia (recurrent): Secondary | ICD-10-CM

## 2020-01-31 DIAGNOSIS — Z823 Family history of stroke: Secondary | ICD-10-CM

## 2020-01-31 DIAGNOSIS — N186 End stage renal disease: Secondary | ICD-10-CM | POA: Diagnosis present

## 2020-01-31 DIAGNOSIS — J45909 Unspecified asthma, uncomplicated: Secondary | ICD-10-CM | POA: Diagnosis present

## 2020-01-31 DIAGNOSIS — M19011 Primary osteoarthritis, right shoulder: Secondary | ICD-10-CM | POA: Diagnosis present

## 2020-01-31 DIAGNOSIS — Z794 Long term (current) use of insulin: Secondary | ICD-10-CM

## 2020-01-31 DIAGNOSIS — K921 Melena: Secondary | ICD-10-CM | POA: Diagnosis not present

## 2020-01-31 DIAGNOSIS — Z6841 Body Mass Index (BMI) 40.0 and over, adult: Secondary | ICD-10-CM

## 2020-01-31 DIAGNOSIS — Z8673 Personal history of transient ischemic attack (TIA), and cerebral infarction without residual deficits: Secondary | ICD-10-CM

## 2020-01-31 DIAGNOSIS — N2581 Secondary hyperparathyroidism of renal origin: Secondary | ICD-10-CM | POA: Diagnosis present

## 2020-01-31 DIAGNOSIS — G92 Toxic encephalopathy: Secondary | ICD-10-CM | POA: Diagnosis not present

## 2020-01-31 DIAGNOSIS — Z833 Family history of diabetes mellitus: Secondary | ICD-10-CM

## 2020-01-31 DIAGNOSIS — E1143 Type 2 diabetes mellitus with diabetic autonomic (poly)neuropathy: Secondary | ICD-10-CM

## 2020-01-31 DIAGNOSIS — E440 Moderate protein-calorie malnutrition: Secondary | ICD-10-CM | POA: Diagnosis not present

## 2020-01-31 DIAGNOSIS — F329 Major depressive disorder, single episode, unspecified: Secondary | ICD-10-CM

## 2020-01-31 DIAGNOSIS — I2510 Atherosclerotic heart disease of native coronary artery without angina pectoris: Secondary | ICD-10-CM | POA: Diagnosis present

## 2020-01-31 DIAGNOSIS — I4581 Long QT syndrome: Secondary | ICD-10-CM | POA: Diagnosis present

## 2020-01-31 DIAGNOSIS — Z8349 Family history of other endocrine, nutritional and metabolic diseases: Secondary | ICD-10-CM

## 2020-01-31 DIAGNOSIS — M109 Gout, unspecified: Secondary | ICD-10-CM | POA: Diagnosis present

## 2020-01-31 DIAGNOSIS — Z9115 Patient's noncompliance with renal dialysis: Secondary | ICD-10-CM

## 2020-01-31 DIAGNOSIS — I96 Gangrene, not elsewhere classified: Secondary | ICD-10-CM | POA: Diagnosis not present

## 2020-01-31 DIAGNOSIS — L97524 Non-pressure chronic ulcer of other part of left foot with necrosis of bone: Secondary | ICD-10-CM | POA: Diagnosis present

## 2020-01-31 DIAGNOSIS — R0602 Shortness of breath: Secondary | ICD-10-CM

## 2020-01-31 DIAGNOSIS — I132 Hypertensive heart and chronic kidney disease with heart failure and with stage 5 chronic kidney disease, or end stage renal disease: Secondary | ICD-10-CM | POA: Diagnosis present

## 2020-01-31 DIAGNOSIS — F419 Anxiety disorder, unspecified: Secondary | ICD-10-CM | POA: Diagnosis present

## 2020-01-31 DIAGNOSIS — F32A Depression, unspecified: Secondary | ICD-10-CM | POA: Diagnosis present

## 2020-01-31 DIAGNOSIS — T426X5A Adverse effect of other antiepileptic and sedative-hypnotic drugs, initial encounter: Secondary | ICD-10-CM | POA: Diagnosis not present

## 2020-01-31 DIAGNOSIS — Z8269 Family history of other diseases of the musculoskeletal system and connective tissue: Secondary | ICD-10-CM

## 2020-01-31 DIAGNOSIS — E785 Hyperlipidemia, unspecified: Secondary | ICD-10-CM

## 2020-01-31 DIAGNOSIS — E1122 Type 2 diabetes mellitus with diabetic chronic kidney disease: Secondary | ICD-10-CM

## 2020-01-31 DIAGNOSIS — L7632 Postprocedural hematoma of skin and subcutaneous tissue following other procedure: Secondary | ICD-10-CM | POA: Diagnosis not present

## 2020-01-31 DIAGNOSIS — Z992 Dependence on renal dialysis: Secondary | ICD-10-CM

## 2020-01-31 DIAGNOSIS — M19012 Primary osteoarthritis, left shoulder: Secondary | ICD-10-CM | POA: Diagnosis present

## 2020-01-31 DIAGNOSIS — Z9181 History of falling: Secondary | ICD-10-CM

## 2020-01-31 DIAGNOSIS — Z79899 Other long term (current) drug therapy: Secondary | ICD-10-CM

## 2020-01-31 DIAGNOSIS — Z8719 Personal history of other diseases of the digestive system: Secondary | ICD-10-CM

## 2020-01-31 DIAGNOSIS — Z8249 Family history of ischemic heart disease and other diseases of the circulatory system: Secondary | ICD-10-CM

## 2020-01-31 DIAGNOSIS — D509 Iron deficiency anemia, unspecified: Secondary | ICD-10-CM | POA: Diagnosis present

## 2020-01-31 DIAGNOSIS — Z8619 Personal history of other infectious and parasitic diseases: Secondary | ICD-10-CM

## 2020-01-31 DIAGNOSIS — F17210 Nicotine dependence, cigarettes, uncomplicated: Secondary | ICD-10-CM | POA: Diagnosis present

## 2020-01-31 DIAGNOSIS — Z7951 Long term (current) use of inhaled steroids: Secondary | ICD-10-CM

## 2020-01-31 LAB — RESPIRATORY PANEL BY RT PCR (FLU A&B, COVID)
Influenza A by PCR: NEGATIVE
Influenza B by PCR: NEGATIVE
SARS Coronavirus 2 by RT PCR: NEGATIVE

## 2020-01-31 LAB — C-REACTIVE PROTEIN: CRP: 2.3 mg/dL — ABNORMAL HIGH (ref <1.0)

## 2020-01-31 LAB — CBC WITH DIFFERENTIAL/PLATELET
Abs Immature Granulocytes: 0.07 10*3/uL (ref 0.00–0.07)
Basophils Absolute: 0 10*3/uL (ref 0.0–0.1)
Basophils Relative: 1 %
Eosinophils Absolute: 0.2 10*3/uL (ref 0.0–0.5)
Eosinophils Relative: 3 %
HCT: 27.1 % — ABNORMAL LOW (ref 36.0–46.0)
Hemoglobin: 7.8 g/dL — ABNORMAL LOW (ref 12.0–15.0)
Immature Granulocytes: 1 %
Lymphocytes Relative: 14 %
Lymphs Abs: 1.1 10*3/uL (ref 0.7–4.0)
MCH: 25 pg — ABNORMAL LOW (ref 26.0–34.0)
MCHC: 28.8 g/dL — ABNORMAL LOW (ref 30.0–36.0)
MCV: 86.9 fL (ref 80.0–100.0)
Monocytes Absolute: 0.7 10*3/uL (ref 0.1–1.0)
Monocytes Relative: 9 %
Neutro Abs: 5.7 10*3/uL (ref 1.7–7.7)
Neutrophils Relative %: 72 %
Platelets: 461 10*3/uL — ABNORMAL HIGH (ref 150–400)
RBC: 3.12 MIL/uL — ABNORMAL LOW (ref 3.87–5.11)
RDW: 21.7 % — ABNORMAL HIGH (ref 11.5–15.5)
WBC: 7.9 10*3/uL (ref 4.0–10.5)
nRBC: 1.1 % — ABNORMAL HIGH (ref 0.0–0.2)

## 2020-01-31 LAB — COMPREHENSIVE METABOLIC PANEL
ALT: 10 U/L (ref 0–44)
AST: 12 U/L — ABNORMAL LOW (ref 15–41)
Albumin: 2.8 g/dL — ABNORMAL LOW (ref 3.5–5.0)
Alkaline Phosphatase: 111 U/L (ref 38–126)
Anion gap: 16 — ABNORMAL HIGH (ref 5–15)
BUN: 88 mg/dL — ABNORMAL HIGH (ref 8–23)
CO2: 23 mmol/L (ref 22–32)
Calcium: 8 mg/dL — ABNORMAL LOW (ref 8.9–10.3)
Chloride: 101 mmol/L (ref 98–111)
Creatinine, Ser: 8.57 mg/dL — ABNORMAL HIGH (ref 0.44–1.00)
GFR calc Af Amer: 5 mL/min — ABNORMAL LOW (ref >60)
GFR calc non Af Amer: 4 mL/min — ABNORMAL LOW (ref >60)
Glucose, Bld: 93 mg/dL (ref 70–99)
Potassium: 4.1 mmol/L (ref 3.5–5.1)
Sodium: 140 mmol/L (ref 135–145)
Total Bilirubin: 0.3 mg/dL (ref 0.3–1.2)
Total Protein: 6.5 g/dL (ref 6.5–8.1)

## 2020-01-31 LAB — GLUCOSE, CAPILLARY: Glucose-Capillary: 222 mg/dL — ABNORMAL HIGH (ref 70–99)

## 2020-01-31 LAB — PREALBUMIN: Prealbumin: 15.9 mg/dL — ABNORMAL LOW (ref 18–38)

## 2020-01-31 LAB — LIPASE, BLOOD: Lipase: 26 U/L (ref 11–51)

## 2020-01-31 LAB — TROPONIN I (HIGH SENSITIVITY)
Troponin I (High Sensitivity): 21 ng/L — ABNORMAL HIGH (ref <18)
Troponin I (High Sensitivity): 19 ng/L — ABNORMAL HIGH (ref <18)

## 2020-01-31 LAB — LACTIC ACID, PLASMA
Lactic Acid, Venous: 1.5 mmol/L (ref 0.5–1.9)
Lactic Acid, Venous: 1.2 mmol/L (ref 0.5–1.9)

## 2020-01-31 LAB — HEPATITIS B SURFACE ANTIGEN: Hepatitis B Surface Ag: NONREACTIVE

## 2020-01-31 LAB — D-DIMER, QUANTITATIVE: D-Dimer, Quant: 1.33 ug/mL-FEU — ABNORMAL HIGH (ref 0.00–0.50)

## 2020-01-31 MED ORDER — TECHNETIUM TC 99M DIETHYLENETRIAME-PENTAACETIC ACID
41.2000 | Freq: Once | INTRAVENOUS | Status: AC | PRN
  Administered 2020-01-31: 41.2 via INTRAVENOUS
  Filled 2020-01-31: qty 41.2

## 2020-01-31 MED ORDER — SODIUM BICARBONATE 650 MG PO TABS
650.0000 mg | ORAL_TABLET | Freq: Three times a day (TID) | ORAL | Status: DC
  Administered 2020-01-31 – 2020-02-05 (×13): 650 mg via ORAL
  Filled 2020-01-31 (×13): qty 1

## 2020-01-31 MED ORDER — GABAPENTIN 300 MG PO CAPS
300.0000 mg | ORAL_CAPSULE | Freq: Every day | ORAL | Status: DC
  Administered 2020-02-01 – 2020-02-03 (×3): 300 mg via ORAL
  Filled 2020-01-31 (×3): qty 1

## 2020-01-31 MED ORDER — MOMETASONE FURO-FORMOTEROL FUM 100-5 MCG/ACT IN AERO
2.0000 | INHALATION_SPRAY | Freq: Two times a day (BID) | RESPIRATORY_TRACT | Status: DC
  Administered 2020-02-01 – 2020-02-10 (×16): 2 via RESPIRATORY_TRACT
  Filled 2020-01-31 (×2): qty 8.8

## 2020-01-31 MED ORDER — LINACLOTIDE 72 MCG PO CAPS
72.0000 ug | ORAL_CAPSULE | Freq: Every day | ORAL | Status: DC
  Administered 2020-02-01 – 2020-02-10 (×8): 72 ug via ORAL
  Filled 2020-01-31 (×11): qty 1

## 2020-01-31 MED ORDER — NITROGLYCERIN 0.4 MG SL SUBL: 0.4000 mg | SUBLINGUAL_TABLET | SUBLINGUAL | Status: DC | PRN

## 2020-01-31 MED ORDER — VANCOMYCIN HCL IN DEXTROSE 1-5 GM/200ML-% IV SOLN
1000.0000 mg | INTRAVENOUS | Status: DC
  Filled 2020-01-31: qty 200

## 2020-01-31 MED ORDER — ONDANSETRON HCL 4 MG PO TABS: 4.0000 mg | ORAL_TABLET | Freq: Four times a day (QID) | ORAL | Status: DC | PRN

## 2020-01-31 MED ORDER — SUCROFERRIC OXYHYDROXIDE 500 MG PO CHEW
500.0000 mg | CHEWABLE_TABLET | Freq: Three times a day (TID) | ORAL | Status: DC
  Administered 2020-02-01 – 2020-02-09 (×22): 500 mg via ORAL
  Filled 2020-01-31 (×24): qty 1

## 2020-01-31 MED ORDER — PRAVASTATIN SODIUM 40 MG PO TABS
40.0000 mg | ORAL_TABLET | Freq: Every day | ORAL | Status: DC
  Administered 2020-02-01 – 2020-02-10 (×10): 40 mg via ORAL
  Filled 2020-01-31 (×10): qty 1

## 2020-01-31 MED ORDER — TECHNETIUM TO 99M ALBUMIN AGGREGATED
1.5000 | Freq: Once | INTRAVENOUS | Status: AC | PRN
  Administered 2020-01-31: 1.5 via INTRAVENOUS
  Filled 2020-01-31: qty 1.5

## 2020-01-31 MED ORDER — VANCOMYCIN HCL 2000 MG/400ML IV SOLN
2000.0000 mg | Freq: Once | INTRAVENOUS | Status: AC
  Administered 2020-01-31: 2000 mg via INTRAVENOUS
  Filled 2020-01-31 (×2): qty 400

## 2020-01-31 MED ORDER — LIDOCAINE HCL (PF) 1 % IJ SOLN: 5.0000 mL | INTRAMUSCULAR | Status: DC | PRN

## 2020-01-31 MED ORDER — METRONIDAZOLE IN NACL 5-0.79 MG/ML-% IV SOLN
500.0000 mg | Freq: Three times a day (TID) | INTRAVENOUS | Status: DC
  Administered 2020-01-31 – 2020-02-03 (×8): 500 mg via INTRAVENOUS
  Filled 2020-01-31 (×8): qty 100

## 2020-01-31 MED ORDER — HEPARIN SODIUM (PORCINE) 1000 UNIT/ML DIALYSIS: 1000.0000 [IU] | INTRAMUSCULAR | Status: DC | PRN

## 2020-01-31 MED ORDER — SODIUM CHLORIDE 0.9 % IV SOLN
125.0000 mg | INTRAVENOUS | Status: AC
  Administered 2020-01-31 – 2020-02-04 (×3): 125 mg via INTRAVENOUS
  Filled 2020-01-31 (×3): qty 10

## 2020-01-31 MED ORDER — INSULIN ASPART 100 UNIT/ML ~~LOC~~ SOLN
2.0000 [IU] | Freq: Three times a day (TID) | SUBCUTANEOUS | Status: DC
  Administered 2020-02-01: 2 [IU] via SUBCUTANEOUS

## 2020-01-31 MED ORDER — ONDANSETRON HCL 4 MG/2ML IJ SOLN: 4.0000 mg | Freq: Four times a day (QID) | INTRAMUSCULAR | Status: DC | PRN

## 2020-01-31 MED ORDER — PANTOPRAZOLE SODIUM 40 MG PO TBEC
40.0000 mg | DELAYED_RELEASE_TABLET | Freq: Every day | ORAL | Status: DC
  Administered 2020-02-01 – 2020-02-10 (×10): 40 mg via ORAL
  Filled 2020-01-31 (×10): qty 1

## 2020-01-31 MED ORDER — ACETAMINOPHEN 325 MG PO TABS
650.0000 mg | ORAL_TABLET | Freq: Four times a day (QID) | ORAL | Status: DC | PRN
  Administered 2020-02-04: 650 mg via ORAL

## 2020-01-31 MED ORDER — HEPARIN SODIUM (PORCINE) 1000 UNIT/ML IJ SOLN
INTRAMUSCULAR | Status: AC
  Filled 2020-01-31: qty 4

## 2020-01-31 MED ORDER — BUPROPION HCL ER (SR) 150 MG PO TB12
150.0000 mg | ORAL_TABLET | Freq: Two times a day (BID) | ORAL | Status: DC
  Administered 2020-01-31 – 2020-02-04 (×9): 150 mg via ORAL
  Filled 2020-01-31 (×10): qty 1

## 2020-01-31 MED ORDER — HEPARIN SODIUM (PORCINE) 5000 UNIT/ML IJ SOLN: 5000.0000 [IU] | Freq: Three times a day (TID) | INTRAMUSCULAR | Status: DC

## 2020-01-31 MED ORDER — FERROUS SULFATE 325 (65 FE) MG PO TABS
325.0000 mg | ORAL_TABLET | Freq: Two times a day (BID) | ORAL | Status: DC
  Administered 2020-02-01 – 2020-02-04 (×7): 325 mg via ORAL
  Filled 2020-01-31 (×8): qty 1

## 2020-01-31 MED ORDER — HEPARIN SODIUM (PORCINE) 5000 UNIT/ML IJ SOLN
7500.0000 [IU] | Freq: Three times a day (TID) | INTRAMUSCULAR | Status: DC
  Administered 2020-01-31 – 2020-02-02 (×5): 7500 [IU] via SUBCUTANEOUS
  Filled 2020-01-31 (×5): qty 2
  Filled 2020-01-31: qty 1.5

## 2020-01-31 MED ORDER — IPRATROPIUM-ALBUTEROL 0.5-2.5 (3) MG/3ML IN SOLN: 3.0000 mL | Freq: Four times a day (QID) | RESPIRATORY_TRACT | Status: DC | PRN

## 2020-01-31 MED ORDER — ALLOPURINOL 100 MG PO TABS
100.0000 mg | ORAL_TABLET | Freq: Two times a day (BID) | ORAL | Status: DC
  Administered 2020-01-31 – 2020-02-10 (×20): 100 mg via ORAL
  Filled 2020-01-31 (×20): qty 1

## 2020-01-31 MED ORDER — LATANOPROST 0.005 % OP SOLN
1.0000 [drp] | Freq: Every day | OPHTHALMIC | Status: DC
  Administered 2020-01-31 – 2020-02-09 (×9): 1 [drp] via OPHTHALMIC
  Filled 2020-01-31: qty 2.5

## 2020-01-31 MED ORDER — LIDOCAINE-PRILOCAINE 2.5-2.5 % EX CREA: 1.0000 "application " | TOPICAL_CREAM | CUTANEOUS | Status: DC | PRN

## 2020-01-31 MED ORDER — CHLORHEXIDINE GLUCONATE CLOTH 2 % EX PADS
6.0000 | MEDICATED_PAD | Freq: Every day | CUTANEOUS | Status: DC
  Administered 2020-02-01 – 2020-02-10 (×4): 6 via TOPICAL

## 2020-01-31 MED ORDER — SODIUM CHLORIDE 0.9 % IV SOLN: 100.0000 mL | INTRAVENOUS | Status: DC | PRN

## 2020-01-31 MED ORDER — BRINZOLAMIDE 1 % OP SUSP
1.0000 [drp] | Freq: Two times a day (BID) | OPHTHALMIC | Status: DC
  Administered 2020-01-31 – 2020-02-10 (×19): 1 [drp] via OPHTHALMIC
  Filled 2020-01-31 (×2): qty 10

## 2020-01-31 MED ORDER — SODIUM CHLORIDE 0.9% FLUSH
3.0000 mL | Freq: Two times a day (BID) | INTRAVENOUS | Status: DC
  Administered 2020-01-31 – 2020-02-08 (×10): 3 mL via INTRAVENOUS

## 2020-01-31 MED ORDER — METOPROLOL TARTRATE 25 MG PO TABS
25.0000 mg | ORAL_TABLET | Freq: Two times a day (BID) | ORAL | Status: DC
  Administered 2020-01-31 – 2020-02-09 (×15): 25 mg via ORAL
  Filled 2020-01-31 (×19): qty 1

## 2020-01-31 MED ORDER — ALTEPLASE 2 MG IJ SOLR: 2.0000 mg | Freq: Once | INTRAMUSCULAR | Status: DC | PRN

## 2020-01-31 MED ORDER — ASPIRIN EC 325 MG PO TBEC
325.0000 mg | DELAYED_RELEASE_TABLET | Freq: Every day | ORAL | Status: DC
  Administered 2020-02-01 – 2020-02-10 (×10): 325 mg via ORAL
  Filled 2020-01-31 (×10): qty 1

## 2020-01-31 MED ORDER — SODIUM CHLORIDE 0.9 % IV SOLN
2.0000 g | INTRAVENOUS | Status: DC
  Administered 2020-01-31 – 2020-02-08 (×8): 2 g via INTRAVENOUS
  Filled 2020-01-31 (×7): qty 20
  Filled 2020-01-31: qty 2

## 2020-01-31 MED ORDER — PENTAFLUOROPROP-TETRAFLUOROETH EX AERO: 1.0000 "application " | INHALATION_SPRAY | CUTANEOUS | Status: DC | PRN

## 2020-01-31 MED ORDER — ACETAMINOPHEN 650 MG RE SUPP
650.0000 mg | Freq: Four times a day (QID) | RECTAL | Status: DC | PRN
  Filled 2020-01-31: qty 1

## 2020-01-31 NOTE — ED Provider Notes (Signed)
[facility] EMERGENCY DEPARTMENT Provider Note   CSN: [id number] Arrival date & time: 01/31/20  1021     History Chief Complaint  Patient presents with  . Chest Pain    LORRAINA SPRING is a 69 y.o. female.  The history is provided by the patient and medical records. No language interpreter was used.  Shortness of Breath Severity:  Moderate Onset quality:  Gradual Duration:  4 days Timing:  Constant Progression:  Waxing and waning Chronicity:  Recurrent Relieved by:  Nothing Worsened by:  Nothing Ineffective treatments:  None tried Associated symptoms: chest pain   Associated symptoms: no abdominal pain, no cough, no diaphoresis, no fever, no headaches, no neck pain, no sputum production, no vomiting and no wheezing   Risk factors: no hx of PE/DVT        Past Medical History:  Diagnosis Date  . Abscess   . Acute blood loss anemia 08/17/2019  . Acute respiratory failure ([place]) 10/18/2014  . Acute respiratory failure with hypoxia and hypercapnia ([place]) 06/01/2019  . Anemia 08/2016  . Angiodysplasia of colon   . Arthritis of left shoulder region 03/23/2013  . Bleeding gastrointestinal   . Cardiomegaly 05/2019  . Chest pain 04/17/2016  . Chronic combined systolic and diastolic CHF (congestive heart failure) (HCC)    a. EF 40-45%, mild LVH, mid apicalanteroseptal and apical HK.  . CKD (chronic kidney disease), stage III   . Cocaine abuse ([place])    crack cocaine heavily until 2008 then sporadic use since then  . Coronary artery disease    a. 06/2012 NSTEMI/CABG x 3 (LIMA->LAD, VG->OM2, VG->LCX);  b. 04/2015 MV: EF<30%, mid ant, apicalanterior, apical infarct;  c. 04/2015 Cath: LM nl, LAD 90p, LCX 79m, OM1 min irregs, RCA mild dzs, LIMA->LAD nl w/ dist LAD dzs, VG->OM2 nl, VG->LCX nl-->Med Rx.  . CVA (cerebral infarction)    a. right internal capsule stroke in 12/2006  . Demand ischemia ([place])   . Diabetes mellitus    diagnosed in 2008  . Elevated troponin  04/27/2019  . Essential hypertension   . Glaucoma   . Gout   . Heme positive stool   . HFrEF (heart failure with reduced ejection fraction) ([place])   . Hyperlipidemia   . Hyperparathyroidism, secondary renal ([place])   . Hypertensive crisis 06/02/2019  . Left-sided sensory deficit present   . Lobar pneumonia ([place]) 04/27/2019  . Obesity, morbid ([place])   . Pneumonia   . Pulmonary edema 05/2019  . PVD (peripheral vascular disease) ([place])    a. 06/2012 ABI's: R - 0.73, L - 0.71.  Marland Kitchen Renal mass, right   . Sepsis ([place]) 04/27/2019  . Shortness of breath dyspnea   . Stroke ([place])   . Thrombocytosis ([place]) 04/17/2016  . Thyroid nodule    FNA in 0932 showed follicular cells but not definate neoplasm  . Tobacco abuse   . Trichomoniasis     Patient Active Problem List   Diagnosis Date Noted  . Acute encephalopathy 01/02/2020  . AMS (altered mental status) 01/01/2020  . Acute pulmonary edema (HCC)   . Recurrent falls 10/09/2019  . Generalized weakness 10/09/2019  . Unspecified atrial fibrillation ([place]) 10/08/2019  . Acute GI bleeding 10/05/2019  . Hyperkalemia   . Depression   . Esophageal stricture   . Diabetic gastroparesis ([place])   . Upper GI bleed 08/16/2019  . Elevated troponin 04/27/2019  . Hiatal hernia   . Iron deficiency anemia   . Microcytic anemia  02/05/2016  . Acute on chronic renal failure ([place])   . CAD in native artery   . Substance abuse ([place])   . Chronic combined systolic and diastolic CHF (congestive heart failure) ([place])   . ESRD (end stage renal disease) ([place])   . Obesity, Class III, BMI 40-49.9 (morbid obesity) ([place])   . Type 2 diabetes mellitus with chronic kidney disease on chronic dialysis, with long-term current use of insulin ([place]) 10/18/2014  . S/P CABG (coronary artery bypass graft) 09/21/2013  . Arthritis of right shoulder region 03/23/2013  . Tobacco abuse   . Coronary artery disease   . NSTEMI (non-ST elevated myocardial infarction) ([place]) 06/29/2012  . History  of cocaine abuse ([place]) 06/29/2012  . Essential hypertension 06/29/2012  . Glaucoma   . Hyperlipidemia   . History of CVA (cerebrovascular accident)     Past Surgical History:  Procedure Laterality Date  . AV FISTULA PLACEMENT Left 08/25/2019   Procedure: ARTERIOVENOUS (AV) BRACHIOCEPHALIC FISTULA CREATION;  Surgeon: Waynetta Sandy, MD;  Location: [facility];  Service: Vascular;  Laterality: Left;  . CARDIAC CATHETERIZATION    . CARDIAC CATHETERIZATION N/A 05/17/2015   Procedure: Left Heart Cath and Cors/Grafts Angiography;  Surgeon: [name], MD;  Location: [facility] CV LAB;  Service: Cardiovascular;  Laterality: N/A;  . COLONOSCOPY WITH PROPOFOL N/A 04/21/2016   Procedure: COLONOSCOPY WITH PROPOFOL;  Surgeon: Irene Shipper, MD;  Location: [facility];  Service: Endoscopy;  Laterality: N/A;  . CORONARY ARTERY BYPASS GRAFT  07/09/2012   Procedure: CORONARY ARTERY BYPASS GRAFTING (CABG);  Surgeon: Ivin Poot, MD;  Location: [facility];  Service: Open Heart Surgery;  Laterality: N/A;  . ENTEROSCOPY N/A 10/07/2019   Procedure: ENTEROSCOPY;  Surgeon: Jackquline Denmark, MD;  Location: [facility] ENDOSCOPY;  Service: Endoscopy;  Laterality: N/A;  . ESOPHAGOGASTRODUODENOSCOPY N/A 04/20/2016   Procedure: ESOPHAGOGASTRODUODENOSCOPY (EGD);  Surgeon: Gatha Mayer, MD;  Location: [facility] ENDOSCOPY;  Service: Endoscopy;  Laterality: N/A;  . ESOPHAGOGASTRODUODENOSCOPY (EGD) WITH PROPOFOL N/A 08/17/2019   Procedure: ESOPHAGOGASTRODUODENOSCOPY (EGD) WITH PROPOFOL;  Surgeon: Irene Shipper, MD;  Location: [facility] ENDOSCOPY;  Service: Endoscopy;  Laterality: N/A;  . INSERTION OF DIALYSIS CATHETER Right 08/25/2019   Procedure: INSERTION OF DIALYSIS CATHETER;  Surgeon: Waynetta Sandy, MD;  Location: [facility];  Service: Vascular;  Laterality: Right;  . IR FLUORO GUIDE CV LINE RIGHT  08/19/2019  . IR US GUIDE VASC ACCESS RIGHT  08/19/2019  . LEFT HEART CATHETERIZATION WITH CORONARY ANGIOGRAM N/A 06/29/2012   Procedure:  LEFT HEART CATHETERIZATION WITH CORONARY ANGIOGRAM;  Surgeon: Peter M Martinique, MD;  Location: [facility] CATH LAB;  Service: Cardiovascular;  Laterality: N/A;  . STERNAL WOUND DEBRIDEMENT  08/17/2012   Procedure: STERNAL WOUND DEBRIDEMENT;  Surgeon: Ivin Poot, MD;  Location: [facility] OR;  Service: Thoracic;  Laterality: N/A;  wound vac application  . STERNAL WOUND DEBRIDEMENT  08/24/2012   Procedure: STERNAL WOUND DEBRIDEMENT;  Surgeon: Ivin Poot, MD;  Location: [facility];  Service: Thoracic;  Laterality: N/A;  . STERNAL WOUND DEBRIDEMENT  09/01/2012   Procedure: STERNAL WOUND DEBRIDEMENT;  Surgeon: Ivin Poot, MD;  Location: [facility];  Service: Thoracic;  Laterality: N/A;  . STERNAL WOUND DEBRIDEMENT  09/20/2012   Procedure: STERNAL WOUND DEBRIDEMENT;  Surgeon: Ivin Poot, MD;  Location: [facility] OR;  Service: Thoracic;  Laterality: N/A;  wound vac change  . SUBMUCOSAL TATTOO INJECTION  10/07/2019   Procedure: SUBMUCOSAL TATTOO INJECTION;  Surgeon: Jackquline Denmark, MD;  Location: [facility]  ENDOSCOPY;  Service: Endoscopy;;     OB History   No obstetric history on file.     Family History  Problem Relation Age of Onset  . Diabetes Mother   . Hypertension Mother   . Cancer Mother   . Hyperlipidemia Father   . Hypertension Father   . Kidney disease Father   . Gout Father   . Cerebrovascular Accident Father   . Other Other        no known family CAD    Social History   Tobacco Use  . Smoking status: Current Some Day Smoker    Packs/day: 0.25    Years: 50.00    Pack years: 12.50    Types: Cigarettes  . Smokeless tobacco: Never Used  Substance Use Topics  . Alcohol use: No    Alcohol/week: 0.0 standard drinks  . Drug use: Yes    Types: Cocaine    Comment: pt denies at current time    Home Medications Prior to Admission medications   Medication Sig Start Date End Date Taking? Authorizing Provider  allopurinol (ZYLOPRIM) 100 MG tablet Take 100 mg by mouth 2 (two) times daily.  11/25/17    [provider]  aspirin EC 325 MG tablet Take 325 mg by mouth daily. 10/28/19   [provider]  brinzolamide (AZOPT) 1 % ophthalmic suspension Place 1 drop into both eyes 2 (two) times a day.     [provider]  buPROPion (WELLBUTRIN SR) 150 MG 12 hr tablet Take 1 tablet (150 mg total) by mouth 2 (two) times daily. 01/16/16   Mikhail, Velta Addison, DO  cetirizine (ZYRTEC) 10 MG tablet Take 10 mg by mouth daily as needed for allergies.     [provider]  ferrous sulfate 325 (65 FE) MG tablet Take 325 mg by mouth 2 (two) times daily with a meal.     [provider]  gabapentin (NEURONTIN) 300 MG capsule Take 1 capsule (300 mg total) by mouth daily. 12/12/19 01/11/20  Domenic Polite, MD  insulin aspart (NOVOLOG) 100 UNIT/ML injection Inject 0-15 Units into the skin 3 (three) times daily with meals. Patient taking differently: Inject 2 Units into the skin 3 (three) times daily with meals.  10/11/19   Nicole Kindred A, DO  ipratropium (ATROVENT HFA) 17 MCG/ACT inhaler Inhale 2 puffs into the lungs every 6 (six) hours as needed for wheezing.    [provider]  latanoprost (XALATAN) 0.005 % ophthalmic solution Place 1 drop into both eyes at bedtime.    [provider]  LINZESS 72 MCG capsule Take 72 mcg by mouth daily before breakfast.  11/21/17   [provider]  metoprolol tartrate (LOPRESSOR) 25 MG tablet Take 0.5 tablets (12.5 mg total) by mouth 2 (two) times daily. Patient taking differently: Take 25 mg by mouth 2 (two) times daily.  10/11/19   Ezekiel Slocumb, DO  mometasone-formoterol (DULERA) 100-5 MCG/ACT AERO Inhale 2 puffs into the lungs every 4 (four) hours as needed for wheezing.    [provider]  nitroGLYCERIN (NITROSTAT) 0.4 MG SL tablet Place 1 tablet (0.4 mg total) under the tongue every 5 (five) minutes as needed for chest pain. 04/30/19   [name], MD  pantoprazole (PROTONIX) 40 MG tablet Take 1 tablet (40  mg total) by mouth daily. 08/26/19   Jeanmarie Hubert, MD  polyethylene glycol (MIRALAX / GLYCOLAX) 17 g packet Take 17 g by mouth daily as needed for mild constipation.  [provider]  pravastatin (PRAVACHOL) 40 MG tablet Take 40 mg by mouth daily. 10/28/19   [provider]  PROAIR HFA 108 (90 Base) MCG/ACT inhaler Inhale 2 puffs into the lungs every 6 (six) hours as needed for wheezing.  11/17/17   [provider]  sodium bicarbonate 650 MG tablet Take 1 tablet (650 mg total) by mouth 3 (three) times daily. 01/02/20   Black, Lezlie Octave, NP  VELPHORO 500 MG chewable tablet Chew 500 mg by mouth 3 (three) times daily with meals. 11/22/19   [provider]  Grant Ruts INHUB 250-50 MCG/DOSE AEPB Inhale 2 puffs into the lungs daily.  05/04/19   [provider]    Allergies    Naproxen  Review of Systems   Review of Systems  Constitutional: Positive for fatigue. Negative for chills, diaphoresis and fever.  HENT: Negative for congestion.   Eyes: Negative for visual disturbance.  Respiratory: Positive for chest tightness and shortness of breath. Negative for cough, sputum production and wheezing.   Cardiovascular: Positive for chest pain.  Gastrointestinal: Negative for abdominal pain, constipation, diarrhea, nausea and vomiting.  Genitourinary: Negative for difficulty urinating and flank pain.  Musculoskeletal: Negative for back pain, neck pain and neck stiffness.  Neurological: Positive for light-headedness. Negative for dizziness and headaches.  Psychiatric/Behavioral: Negative for agitation and confusion.  All other systems reviewed and are negative.   Physical Exam Updated Vital Signs BP 120/67 (BP Location: Right Arm)   Pulse 82   Temp 98.2 F (36.8 C)   Resp 16   Ht 5\' 8"  (1.727 m)   Wt 121.1 kg   SpO2 100%   BMI 40.60 kg/m   Physical Exam Vitals and nursing note reviewed.  Constitutional:      General: She is not in acute distress.     Appearance: She is well-developed. She is not diaphoretic.  HENT:     Head: Normocephalic and atraumatic.     Right Ear: External ear normal.     Left Ear: External ear normal.     Nose: Nose normal.     Mouth/Throat:     Pharynx: No oropharyngeal exudate.  Eyes:     Extraocular Movements: Extraocular movements intact.     Conjunctiva/sclera: Conjunctivae normal.     Pupils: Pupils are equal, round, and reactive to light.  Cardiovascular:     Rate and Rhythm: Normal rate and regular rhythm.     Heart sounds: Normal heart sounds.  Pulmonary:     Effort: Pulmonary effort is normal. No tachypnea or respiratory distress.     Breath sounds: No stridor. Rales present. No wheezing or rhonchi.  Abdominal:     General: There is no distension.     Tenderness: There is no abdominal tenderness. There is no rebound.  Musculoskeletal:     Cervical back: Normal range of motion and neck supple.     Right lower leg: No tenderness. No edema.     Left lower leg: Tenderness (in toe) present. No edema.     Left foot: Tenderness present.     Comments: Tenderness of the left great toe.  Some discoloration and ulceration but no purulence seen.  No crepitance.  Sensation present in both feet.  Palpable DP and PT pulse in both legs.  Skin:    General: Skin is warm.     Findings: No erythema or rash.  Neurological:     General: No focal deficit present.     Mental Status: She  is alert and oriented to person, place, and time.     Motor: No abnormal muscle tone.     Coordination: Coordination normal.     Deep Tendon Reflexes: Reflexes are normal and symmetric.  Psychiatric:        Mood and Affect: Mood normal.     ED Results / Procedures / Treatments   Labs (all labs ordered are listed, but only abnormal results are displayed) Labs Reviewed  CBC WITH DIFFERENTIAL/PLATELET - Abnormal; Notable for the following components:      Result Value   RBC 3.12 (*)    Hemoglobin 7.8 (*)    HCT 27.1 (*)      MCH 25.0 (*)    MCHC 28.8 (*)    RDW 21.7 (*)    Platelets 461 (*)    nRBC 1.1 (*)    All other components within normal limits  COMPREHENSIVE METABOLIC PANEL - Abnormal; Notable for the following components:   BUN 88 (*)    Creatinine, Ser 8.57 (*)    Calcium 8.0 (*)    Albumin 2.8 (*)    AST 12 (*)    GFR calc non Af Amer 4 (*)    GFR calc Af Amer 5 (*)    Anion gap 16 (*)    All other components within normal limits  D-DIMER, QUANTITATIVE (NOT AT [facility]) - Abnormal; Notable for the following components:   D-Dimer, Quant 1.33 (*)    All other components within normal limits  TROPONIN I (HIGH SENSITIVITY) - Abnormal; Notable for the following components:   Troponin I (High Sensitivity) 21 (*)    All other components within normal limits  TROPONIN I (HIGH SENSITIVITY) - Abnormal; Notable for the following components:   Troponin I (High Sensitivity) 19 (*)    All other components within normal limits  RESPIRATORY PANEL BY RT PCR (FLU A&B, COVID)  CULTURE, BLOOD (ROUTINE X 2)  CULTURE, BLOOD (ROUTINE X 2)  LACTIC ACID, PLASMA  LACTIC ACID, PLASMA  LIPASE, BLOOD  SEDIMENTATION RATE  C-REACTIVE PROTEIN  PREALBUMIN    EKG EKG Interpretation  Date/Time:  Tuesday January 31 2020 10:30:38 EST Ventricular Rate:  83 PR Interval:    QRS Duration: 103 QT Interval:  416 QTC Calculation: 489 R Axis:   18 Text Interpretation: Sinus rhythm LVH with secondary repolarization abnormality Borderline prolonged QT interval When compared to prior, previous t wave inversiosn in leads V4-V6 now upright. No STEMI Confirmed by Antony Blackbird [phone]) on 01/31/2020 10:34:37 AM   Radiology DG Chest Portable 1 View  Result Date: 01/31/2020 CLINICAL DATA:  End-stage renal disease, chest pain and left lower extremity swelling EXAM: PORTABLE CHEST 1 VIEW COMPARISON:  01/01/2020 FINDINGS: Single frontal view of the chest demonstrates a right internal jugular dialysis catheter tip overlying superior  vena cava. Postsurgical changes from median sternotomy. Cardiac silhouette is enlarged but stable. Mild central vascular congestion is chronic, with no airspace disease, effusion, or pneumothorax. IMPRESSION: 1. Stable exam, no acute process. Electronically Signed   By: Randa Ngo M.D.   On: 01/31/2020 11:41   DG Foot Complete Left  Result Date: 01/31/2020 CLINICAL DATA:  Left foot swelling, left great toe pain and skin abnormality EXAM: LEFT FOOT - COMPLETE 3+ VIEW COMPARISON:  None. FINDINGS: Frontal, oblique, lateral views of the left foot are obtained. The bones are diffusely osteopenic. Prominent enthesopathic changes of the calcaneus. Joint spaces are relatively well preserved. There is diffuse soft tissue swelling of the forefoot. Evidence  of soft tissue ulceration medial aspect first digit. I do not see any underlying bony destruction or periosteal reaction to suggest osteomyelitis. IMPRESSION: 1. Soft tissue swelling, with ulceration of the first digit. No underlying radiographic evidence of osteomyelitis. If osteomyelitis remains a clinical concern, three-phase bone scan or MRI could be considered. Electronically Signed   By: Randa Ngo M.D.   On: 01/31/2020 11:41    Procedures Procedures (including critical care time)  Medications Ordered in ED Medications  Chlorhexidine Gluconate Cloth 2 % PADS 6 each (has no administration in time range)  ferric gluconate (NULECIT) 125 mg in sodium chloride 0.9 % 100 mL IVPB (has no administration in time range)  sodium chloride flush (NS) 0.9 % injection 3 mL (has no administration in time range)  ondansetron (ZOFRAN) tablet 4 mg (has no administration in time range)    Or  ondansetron (ZOFRAN) injection 4 mg (has no administration in time range)  acetaminophen (TYLENOL) tablet 650 mg (has no administration in time range)    Or  acetaminophen (TYLENOL) suppository 650 mg (has no administration in time range)  metoprolol tartrate (LOPRESSOR)  tablet 25 mg (has no administration in time range)  aspirin EC tablet 325 mg (has no administration in time range)  allopurinol (ZYLOPRIM) tablet 100 mg (has no administration in time range)  nitroGLYCERIN (NITROSTAT) SL tablet 0.4 mg (has no administration in time range)  pravastatin (PRAVACHOL) tablet 40 mg (has no administration in time range)  buPROPion (WELLBUTRIN SR) 12 hr tablet 150 mg (has no administration in time range)  insulin aspart (novoLOG) injection 2 Units (has no administration in time range)  linaclotide (LINZESS) capsule 72 mcg (has no administration in time range)  pantoprazole (PROTONIX) EC tablet 40 mg (has no administration in time range)  sodium bicarbonate tablet 650 mg (has no administration in time range)  sucroferric oxyhydroxide (VELPHORO) chewable tablet 500 mg (has no administration in time range)  ferrous sulfate tablet 325 mg (has no administration in time range)  gabapentin (NEURONTIN) capsule 300 mg (has no administration in time range)  mometasone-formoterol (DULERA) 100-5 MCG/ACT inhaler 2 puff (has no administration in time range)  ipratropium-albuterol (DUONEB) 0.5-2.5 (3) MG/3ML nebulizer solution 3 mL (has no administration in time range)  brinzolamide (AZOPT) 1 % ophthalmic suspension 1 drop (has no administration in time range)  latanoprost (XALATAN) 0.005 % ophthalmic solution 1 drop (has no administration in time range)  heparin injection 7,500 Units (has no administration in time range)    ED Course  I have reviewed the triage vital signs and the nursing notes.  Pertinent labs & imaging results that were available during my care of the patient were reviewed by me and considered in my medical decision making (see chart for details).    MDM Rules/Calculators/A&P                      Tiwanda TREONNA KLEE is a 69 y.o. female with a past medical history significant for ESRD on dialysis TTS, CAD status post CABG, hypertension, hyperlipidemia, prior  stroke, gout, prior GI bleed, CHF, diabetes, and anemia who presents with 4 days of chest pain, shortness of breath, fatigue, and several weeks of left toe pain.  Patient reports that she missed dialysis on Saturday and today due to fatigue, chest pain, shortness of breath.  She also reports she is having difficulty walking due to severe pain in her left great toe.  She is a diabetic and reports she also  has a history of gout.  She thinks it could be gout but her toe does look worse than the past.  She says that she is having chest discomfort since Saturday that is constant and worsened with exertion and deep breathing.  She reports she gets very fatigued when she tries to ambulate worsening her symptoms.  She describes as a central chest pressure that otherwise does not radiate.  She denies nausea, vomiting, constipation, or diarrhea.  She does report she still makes urine.  She reports no fevers or chills or cough.  She denies any Covid contacts.  She reports does not have any calf pain or leg swelling.  No history of DVT or PE reported.  On exam, patient does have tenderness in her left great toe with some darkening of the tip.  She does have sensation and I felt bilateral DP pulses.  Patient's lungs have some crackles in the bases but no significant rhonchi.  No wheezing.  Abdomen and chest are nontender.  Patient was hypoxic on my exam with oxygen saturations in the upper 70s and low 80s on room air.  Will place on 2 L nasal cannula.  Clinically I suspect that patient may have some fluid overload given the rales and hypoxia missing several dialysis treatments.  With her chest discomfort and shortness of breath, will get labs.  Although she has no history of DVT or PE, with the hypoxia, chest pain, shortness of breath with decreased mobility with her foot pain, a D-dimer was ordered.  She reports she does still make urine so she will likely not be able to get a CT PE study.  If D-dimer is positive, she will  need VQ scan.  Patient will get x-ray of the toe as I am concerned about possible osteomyelitis in the foot.  We will get other labs.  She will be put on oxygen for the hypoxia.  Due to the hypoxia, anticipate she will require admission.  I suspect fluid overload and CHF exacerbation primarily.  Covid will be checked in case needs to have dialysis today.  Anticipate admission.  12:06 PM D-dimer is elevated, will order VQ scan.  1:31 PM VQ scan ordered, patient is going to get VQ scan.  Due to the hypoxia, I spoke with nephrology who is going to dialyze patient due to her mobility issues with the left foot pain, the hypoxia that is transient, the chest pain, any dialysis, nephrology recommended admission for further treatment of these problems.  They will dialyze and continue to follow patient.  Medicine team called her admission.  Patient has had some improvement in her oxygen during her ED stay and she has now been weaned off the oxygen but is still having some chest discomfort and shortness of breath intermittently.  She will still need follow-up on the VQ results.   Final Clinical Impression(s) / ED Diagnoses Final diagnoses:  Shortness of breath  Precordial pain  Hypoxia   Clinical Impression: 1. Shortness of breath   2. Precordial pain   3. Hypoxia     Disposition: Admit  This note was prepared with assistance of Dragon voice recognition software. Occasional wrong-word or sound-a-like substitutions may have occurred due to the inherent limitations of voice recognition software.     Alain Deschene, Gwenyth Allegra, MD 01/31/20 [phone]

## 2020-01-31 NOTE — Progress Notes (Addendum)
Pharmacy Antibiotic Note  [name] [name] is a 69 y.o. female admitted on 01/31/2020 with chest pain.  Pharmacy has been consulted for vancomycin dosing for suspected diabetic foot infection (high MRSA risk).  Medical history includes:  CAD (s/p CABG), combined systolic and diastolic CHF, PVD, ESRD on HD (TTS, last dialyzed on 1/28), DM type II, hyperparathyroidism, asthma, polysubstance abuse, anemia of chronic kidney disease, hyperlipidemia, anxiety/depression, GERD, morbid obesity  Pt was last dialyzed on 1/28; she completed a 4-hr dialysis session this afternoon.  WBC 7.9, afebrile  Plan: Vancomycin 2 gm IV X 1, followed by vancomycin 1 gm IV with each HD (T, Th, Sat, per Nephrology note) Monitor WBC, temp, clinical improvement, cultures, vancomycin levels (goal pre-HD vancomycin level: 15-25 mg/L)  Height: 5\' 8"  (172.7 cm) Weight: 266 lb 15.6 oz (121.1 kg) IBW/kg (Calculated) : 63.9  Temp (24hrs), Avg:98.4 F (36.9 C), Min:98.2 F (36.8 C), Max:98.6 F (37 C)  Recent Labs  Lab 01/31/20 1056 01/31/20 1116 01/31/20 1330  WBC 7.9  --   --   CREATININE 8.57*  --   --   LATICACIDVEN  --  1.5 1.2    Estimated Creatinine Clearance: 8.5 mL/min (A) (by C-G formula based on SCr of 8.57 mg/dL (H)).    Allergies  Allergen Reactions  . Naproxen Rash    Antimicrobials this admission: 2/2 ceftriaxone >>  2/2 metronidazole >>   Microbiology results: 2/2 BCx X 2: pending 2/2 COVID, influenza A, influenza B, HBsAg: all negative  Thank you for allowing pharmacy to be a part of this patient's care.  Gillermina Hu, PharmD, BCPS, [facility] Clinical Pharmacist 01/31/2020 6:01 PM

## 2020-01-31 NOTE — ED Triage Notes (Signed)
Pt arrives via EMS from home with reports of CP since Saturday and left foot swelling. Reports missing last 2 days of HD. Sharp pain under left breast with no radiation.

## 2020-01-31 NOTE — Consult Note (Signed)
[place]  Reason for Consultation: ESRD, hypoxia Requesting Provider: Dr Sherry Ruffing  HPI: [name] [name] is an 69 y.o. female with ESRD on hemodialysis T,Th,S at [facility]. PMH: HTN morbid obesity, combined systolic HF with EF 35-57%, CVA, PVD, GIB, gout, crack cocaine abuse, AOCD, SHPT. She presented to the ED today with dyspnea, chest pain and nephrology is consulted for eval and management.   She presented to the ED today with CP, dyspnea and foot pain.  Per ED MD sats into high 70s on RA and improved to 90s on [place].  Foot X ray unrevealing.    Chest pain L breast, started a few days ago; worse with turning and moving not with exertion.   She last dialyzed 1/28 and ran 3:30 of 4:15 hr tx, post weight was 118.4, EDW 118kg.  Says she was too weak to go Sat.   +D dimer, a V/Q scan and admission is planned following HD.   PMH: Past Medical History:  Diagnosis Date  . Abscess   . Acute blood loss anemia 08/17/2019  . Acute respiratory failure ([place]) 10/18/2014  . Acute respiratory failure with hypoxia and hypercapnia ([place]) 06/01/2019  . Anemia 08/2016  . Angiodysplasia of colon   . Arthritis of left shoulder region 03/23/2013  . Bleeding gastrointestinal   . Cardiomegaly 05/2019  . Chest pain 04/17/2016  . Chronic combined systolic and diastolic CHF (congestive heart failure) (HCC)    a. EF 40-45%, mild LVH, mid apicalanteroseptal and apical HK.  . CKD (chronic kidney disease), stage III   . Cocaine abuse ([place])    crack cocaine heavily until 2008 then sporadic use since then  . Coronary artery disease    a. 06/2012 NSTEMI/CABG x 3 (LIMA->LAD, VG->OM2, VG->LCX);  b. 04/2015 MV: EF<30%, mid ant, apicalanterior, apical infarct;  c. 04/2015 Cath: LM nl, LAD 90p, LCX 10m, OM1 min irregs, RCA mild dzs, LIMA->LAD nl w/ dist LAD dzs, VG->OM2 nl, VG->LCX nl-->Med Rx.  . CVA (cerebral infarction)    a. right internal capsule stroke in 12/2006  .  Demand ischemia ([place])   . Diabetes mellitus    diagnosed in 2008  . Elevated troponin 04/27/2019  . Essential hypertension   . Glaucoma   . Gout   . Heme positive stool   . HFrEF (heart failure with reduced ejection fraction) ([place])   . Hyperlipidemia   . Hyperparathyroidism, secondary renal ([place])   . Hypertensive crisis 06/02/2019  . Left-sided sensory deficit present   . Lobar pneumonia ([place]) 04/27/2019  . Obesity, morbid ([place])   . Pneumonia   . Pulmonary edema 05/2019  . PVD (peripheral vascular disease) ([place])    a. 06/2012 ABI's: R - 0.73, L - 0.71.  Marland Kitchen Renal mass, right   . Sepsis ([place]) 04/27/2019  . Shortness of breath dyspnea   . Stroke ([place])   . Thrombocytosis ([place]) 04/17/2016  . Thyroid nodule    FNA in 3220 showed follicular cells but not definate neoplasm  . Tobacco abuse   . Trichomoniasis    PSH: Past Surgical History:  Procedure Laterality Date  . AV FISTULA PLACEMENT Left 08/25/2019   Procedure: ARTERIOVENOUS (AV) BRACHIOCEPHALIC FISTULA CREATION;  Surgeon: Waynetta Sandy, MD;  Location: [place];  Service: Vascular;  Laterality: Left;  . CARDIAC CATHETERIZATION    . CARDIAC CATHETERIZATION N/A 05/17/2015   Procedure: Left Heart Cath and Cors/Grafts Angiography;  Surgeon: [name], MD;  Location: [place] CV LAB;  Service: Cardiovascular;  Laterality: N/A;  . COLONOSCOPY WITH PROPOFOL N/A 04/21/2016   Procedure: COLONOSCOPY WITH PROPOFOL;  Surgeon: Irene Shipper, MD;  Location: [place];  Service: Endoscopy;  Laterality: N/A;  . CORONARY ARTERY BYPASS GRAFT  07/09/2012   Procedure: CORONARY ARTERY BYPASS GRAFTING (CABG);  Surgeon: Ivin Poot, MD;  Location: [place];  Service: Open Heart Surgery;  Laterality: N/A;  . ENTEROSCOPY N/A 10/07/2019   Procedure: ENTEROSCOPY;  Surgeon: Jackquline Denmark, MD;  Location: [facility] ENDOSCOPY;  Service: Endoscopy;  Laterality: N/A;  . ESOPHAGOGASTRODUODENOSCOPY N/A 04/20/2016   Procedure: ESOPHAGOGASTRODUODENOSCOPY (EGD);   Surgeon: Gatha Mayer, MD;  Location: [facility] ENDOSCOPY;  Service: Endoscopy;  Laterality: N/A;  . ESOPHAGOGASTRODUODENOSCOPY (EGD) WITH PROPOFOL N/A 08/17/2019   Procedure: ESOPHAGOGASTRODUODENOSCOPY (EGD) WITH PROPOFOL;  Surgeon: Irene Shipper, MD;  Location: [facility] ENDOSCOPY;  Service: Endoscopy;  Laterality: N/A;  . INSERTION OF DIALYSIS CATHETER Right 08/25/2019   Procedure: INSERTION OF DIALYSIS CATHETER;  Surgeon: Waynetta Sandy, MD;  Location: [place];  Service: Vascular;  Laterality: Right;  . IR FLUORO GUIDE CV LINE RIGHT  08/19/2019  . IR US GUIDE VASC ACCESS RIGHT  08/19/2019  . LEFT HEART CATHETERIZATION WITH CORONARY ANGIOGRAM N/A 06/29/2012   Procedure: LEFT HEART CATHETERIZATION WITH CORONARY ANGIOGRAM;  Surgeon: Peter M Martinique, MD;  Location: [facility] CATH LAB;  Service: Cardiovascular;  Laterality: N/A;  . STERNAL WOUND DEBRIDEMENT  08/17/2012   Procedure: STERNAL WOUND DEBRIDEMENT;  Surgeon: Ivin Poot, MD;  Location: [facility] OR;  Service: Thoracic;  Laterality: N/A;  wound vac application  . STERNAL WOUND DEBRIDEMENT  08/24/2012   Procedure: STERNAL WOUND DEBRIDEMENT;  Surgeon: Ivin Poot, MD;  Location: [place];  Service: Thoracic;  Laterality: N/A;  . STERNAL WOUND DEBRIDEMENT  09/01/2012   Procedure: STERNAL WOUND DEBRIDEMENT;  Surgeon: Ivin Poot, MD;  Location: [place];  Service: Thoracic;  Laterality: N/A;  . STERNAL WOUND DEBRIDEMENT  09/20/2012   Procedure: STERNAL WOUND DEBRIDEMENT;  Surgeon: Ivin Poot, MD;  Location: [facility] OR;  Service: Thoracic;  Laterality: N/A;  wound vac change  . SUBMUCOSAL TATTOO INJECTION  10/07/2019   Procedure: SUBMUCOSAL TATTOO INJECTION;  Surgeon: Jackquline Denmark, MD;  Location: [facility] ENDOSCOPY;  Service: Endoscopy;;     Past Medical History:  Diagnosis Date  . Abscess   . Acute blood loss anemia 08/17/2019  . Acute respiratory failure ([place]) 10/18/2014  . Acute respiratory failure with hypoxia and hypercapnia ([place]) 06/01/2019  . Anemia 08/2016   . Angiodysplasia of colon   . Arthritis of left shoulder region 03/23/2013  . Bleeding gastrointestinal   . Cardiomegaly 05/2019  . Chest pain 04/17/2016  . Chronic combined systolic and diastolic CHF (congestive heart failure) (HCC)    a. EF 40-45%, mild LVH, mid apicalanteroseptal and apical HK.  . CKD (chronic kidney disease), stage III   . Cocaine abuse ([place])    crack cocaine heavily until 2008 then sporadic use since then  . Coronary artery disease    a. 06/2012 NSTEMI/CABG x 3 (LIMA->LAD, VG->OM2, VG->LCX);  b. 04/2015 MV: EF<30%, mid ant, apicalanterior, apical infarct;  c. 04/2015 Cath: LM nl, LAD 90p, LCX 40m, OM1 min irregs, RCA mild dzs, LIMA->LAD nl w/ dist LAD dzs, VG->OM2 nl, VG->LCX nl-->Med Rx.  . CVA (cerebral infarction)    a. right internal capsule stroke in 12/2006  . Demand ischemia ([place])   . Diabetes mellitus    diagnosed in 2008  . Elevated troponin 04/27/2019  .  Essential hypertension   . Glaucoma   . Gout   . Heme positive stool   . HFrEF (heart failure with reduced ejection fraction) ([place])   . Hyperlipidemia   . Hyperparathyroidism, secondary renal ([place])   . Hypertensive crisis 06/02/2019  . Left-sided sensory deficit present   . Lobar pneumonia ([place]) 04/27/2019  . Obesity, morbid ([place])   . Pneumonia   . Pulmonary edema 05/2019  . PVD (peripheral vascular disease) ([place])    a. 06/2012 ABI's: R - 0.73, L - 0.71.  Marland Kitchen Renal mass, right   . Sepsis ([place]) 04/27/2019  . Shortness of breath dyspnea   . Stroke ([place])   . Thrombocytosis ([place]) 04/17/2016  . Thyroid nodule    FNA in 0938 showed follicular cells but not definate neoplasm  . Tobacco abuse   . Trichomoniasis     Medications:  I have reviewed the patient's current medications.  (Not in a hospital admission)   ALLERGIES:   Allergies  Allergen Reactions  . Naproxen Rash    FAM HX: Family History  Problem Relation Age of Onset  . Diabetes Mother   . Hypertension Mother   . Cancer Mother   .  Hyperlipidemia Father   . Hypertension Father   . Kidney disease Father   . Gout Father   . Cerebrovascular Accident Father   . Other Other        no known family CAD    Social History:   reports that she has been smoking cigarettes. She has a 12.50 pack-year smoking history. She has never used smokeless tobacco. She reports current drug use. Drug: Cocaine. She reports that she does not drink alcohol.  ROS: 12 system ROS neg except per HPI above  Blood pressure 120/67, pulse 82, temperature 98.2 F (36.8 C), resp. rate 17, height 5\' 8"  (1.727 m), weight 121.1 kg, SpO2 99 %. PHYSICAL EXAM: Gen: obese woman lying flat on stretcher, no distress  Eyes:  Anicteric, glasses ENT: MMM Neck: supple CV: RRR, distant Abd:  Soft, obese Lungs: clear anteriorly Extr: 1+ edema Neuro: nonfocal   Results for orders placed or performed during the hospital encounter of 01/31/20 (from the past 48 hour(s))  CBC with Differential     Status: Abnormal   Collection Time: 01/31/20 10:56 AM  Result Value Ref Range   WBC 7.9 4.0 - 10.5 K/uL   RBC 3.12 (L) 3.87 - 5.11 MIL/uL   Hemoglobin 7.8 (L) 12.0 - 15.0 g/dL   HCT 27.1 (L) 36.0 - 46.0 %   MCV 86.9 80.0 - 100.0 fL   MCH 25.0 (L) 26.0 - 34.0 pg   MCHC 28.8 (L) 30.0 - 36.0 g/dL   RDW 21.7 (H) 11.5 - 15.5 %   Platelets 461 (H) 150 - 400 K/uL   nRBC 1.1 (H) 0.0 - 0.2 %   Neutrophils Relative % 72 %   Neutro Abs 5.7 1.7 - 7.7 K/uL   Lymphocytes Relative 14 %   Lymphs Abs 1.1 0.7 - 4.0 K/uL   Monocytes Relative 9 %   Monocytes Absolute 0.7 0.1 - 1.0 K/uL   Eosinophils Relative 3 %   Eosinophils Absolute 0.2 0.0 - 0.5 K/uL   Basophils Relative 1 %   Basophils Absolute 0.0 0.0 - 0.1 K/uL   Immature Granulocytes 1 %   Abs Immature Granulocytes 0.07 0.00 - 0.07 K/uL    Comment: Performed at [facility], [address] [place], [place] [address]  Comprehensive metabolic  panel     Status: Abnormal   Collection Time: 01/31/20 10:56 AM   Result Value Ref Range   Sodium 140 135 - 145 mmol/L   Potassium 4.1 3.5 - 5.1 mmol/L   Chloride 101 98 - 111 mmol/L   CO2 23 22 - 32 mmol/L   Glucose, Bld 93 70 - 99 mg/dL   BUN 88 (H) 8 - 23 mg/dL   Creatinine, Ser 8.57 (H) 0.44 - 1.00 mg/dL   Calcium 8.0 (L) 8.9 - 10.3 mg/dL   Total Protein 6.5 6.5 - 8.1 g/dL   Albumin 2.8 (L) 3.5 - 5.0 g/dL   AST 12 (L) 15 - 41 U/L   ALT 10 0 - 44 U/L   Alkaline Phosphatase 111 38 - 126 U/L   Total Bilirubin 0.3 0.3 - 1.2 mg/dL   GFR calc non Af Amer 4 (L) >60 mL/min   GFR calc Af Amer 5 (L) >60 mL/min   Anion gap 16 (H) 5 - 15    Comment: Performed at [facility], [place] [address], [place], [place] [address]  Troponin I (High Sensitivity)     Status: Abnormal   Collection Time: 01/31/20 10:56 AM  Result Value Ref Range   Troponin I (High Sensitivity) 21 (H) <18 ng/L    Comment: (NOTE) Elevated high sensitivity troponin I (hsTnI) values and significant  changes across serial measurements may suggest ACS but many other  chronic and acute conditions are known to elevate hsTnI results.  Refer to the "Links" section for chest pain algorithms and additional  guidance. Performed at [facility], [place] [address], [place], [place] [address]   Lipase, blood     Status: None   Collection Time: 01/31/20 10:56 AM  Result Value Ref Range   Lipase 26 11 - 51 U/L    Comment: Performed at [facility] [address], [place], [place] [address]  D-dimer, quantitative (not at [facility])     Status: Abnormal   Collection Time: 01/31/20 10:56 AM  Result Value Ref Range   D-Dimer, Quant 1.33 (H) 0.00 - 0.50 ug/mL-FEU    Comment: (NOTE) At the manufacturer cut-off of 0.50 ug/mL FEU, this assay has been documented to exclude PE with a sensitivity and negative predictive value of 97 to 99%.  At this time, this assay has not been approved by the FDA to exclude DVT/VTE. Results should be correlated with clinical presentation. Performed at [facility], [place] [address] [place], [place] [address]   Lactic acid, plasma     Status: None   Collection Time: 01/31/20 11:16 AM  Result Value Ref Range   Lactic Acid, Venous 1.5 0.5 - 1.9 mmol/L    Comment: Performed at [facility] [address] [place], [place] [address]  Respiratory Panel by RT PCR (Flu A&B, Covid) - Nasopharyngeal Swab     Status: None   Collection Time: 01/31/20 12:01 PM   Specimen: Nasopharyngeal Swab  Result Value Ref Range   SARS Coronavirus 2 by RT PCR NEGATIVE NEGATIVE    Comment: (NOTE) SARS-CoV-2 target nucleic acids are NOT DETECTED. The SARS-CoV-2 RNA is generally detectable in upper respiratoy specimens during the acute phase of infection. The lowest concentration of SARS-CoV-2 viral copies this assay can detect is 131 copies/mL. A negative result does not preclude SARS-Cov-2 infection and should not be used as the sole basis for treatment or other patient management decisions. A negative result may  occur with  improper specimen collection/handling, submission of specimen other than nasopharyngeal swab, presence of viral mutation(s) within the areas targeted by this assay, and inadequate number of viral copies (<131 copies/mL). A negative result must be combined with clinical observations, patient history, and epidemiological information. The expected result is Negative. Fact Sheet for Patients:  PinkCheek.be Fact Sheet for Healthcare Providers:  GravelBags.it This test is not yet ap proved or cleared by the [place] FDA and  has been authorized for detection and/or diagnosis of SARS-CoV-2 by FDA under an Emergency Use Authorization (EUA). This EUA will remain  in effect (meaning this test can be used) for the duration of the COVID-19 declaration under Section 564(b)(1) of the Act, 21 U.S.C. section 360bbb-3(b)(1), unless the authorization is terminated or revoked sooner.     Influenza A by PCR NEGATIVE NEGATIVE   Influenza B by PCR NEGATIVE NEGATIVE    Comment: (NOTE) The Xpert Xpress SARS-CoV-2/FLU/RSV assay is intended as an aid in  the diagnosis of influenza from Nasopharyngeal swab specimens and  should not be used as a sole basis for treatment. Nasal washings and  aspirates are unacceptable for Xpert Xpress SARS-CoV-2/FLU/RSV  testing. Fact Sheet for Patients: PinkCheek.be Fact Sheet for Healthcare Providers: GravelBags.it This test is not yet approved or cleared by the [place] FDA and  has been authorized for detection and/or diagnosis of SARS-CoV-2 by  FDA under an Emergency Use Authorization (EUA). This EUA will remain  in effect (meaning this test can be used) for the duration of the  Covid-19 declaration under Section 564(b)(1) of the Act, 21  U.S.C. section 360bbb-3(b)(1), unless the authorization is  terminated or revoked. Performed at [facility], [place] [address] [place], [place] [address]   Lactic acid, plasma     Status: None   Collection Time: 01/31/20  1:30 PM  Result Value Ref Range   Lactic Acid, Venous 1.2 0.5 - 1.9 mmol/L    Comment: Performed at [facility] [address] [place], [place] [address]    DG Chest Portable 1 View  Result Date: 01/31/2020 CLINICAL DATA:  End-stage renal disease, chest pain and left lower extremity swelling EXAM: PORTABLE CHEST 1 VIEW COMPARISON:  01/01/2020 FINDINGS: Single frontal view of the chest demonstrates a right internal jugular dialysis catheter tip overlying superior vena cava. Postsurgical changes from median sternotomy. Cardiac silhouette is enlarged but stable. Mild central vascular congestion is chronic, with no airspace disease, effusion, or pneumothorax. IMPRESSION: 1. Stable exam, no acute process. Electronically Signed   By: Randa Ngo M.D.   On: 01/31/2020 11:41   DG Foot Complete Left  Result Date:  01/31/2020 CLINICAL DATA:  Left foot swelling, left great toe pain and skin abnormality EXAM: LEFT FOOT - COMPLETE 3+ VIEW COMPARISON:  None. FINDINGS: Frontal, oblique, lateral views of the left foot are obtained. The bones are diffusely osteopenic. Prominent enthesopathic changes of the calcaneus. Joint spaces are relatively well preserved. There is diffuse soft tissue swelling of the forefoot. Evidence of soft tissue ulceration medial aspect first digit. I do not see any underlying bony destruction or periosteal reaction to suggest osteomyelitis. IMPRESSION: 1. Soft tissue swelling, with ulceration of the first digit. No underlying radiographic evidence of osteomyelitis. If osteomyelitis remains a clinical concern, three-phase bone scan or MRI could be considered. Electronically Signed   By: Randa Ngo M.D.   On: 01/31/2020 11:41   Dialysis Orders: [facility] T,Th,S 4 hours 15 min 180NRe 400/800 118  kg 2.0 K/ 2.0 Ca TDC/AVF -No heparin -Mircera 225 mcg IV q 2 weeks  -Venofer 100 mg IV qtx through 2/11 -Calcitriol 1.75 mcg PO TIW   Assessment/Plan: 1.  Acute Hypoxic Respiratory failure/Volume Overload: HD today with m ax UF as tolerated.  2. Chest pain: per primary, recent similar presentation.  3.  ESRD -  T,Th,S via TDC. K+ 4.1, use 3K dialysate. No heparin.  4.  Hypertension/volume  - 120/67 currently, UF to EDW as tolerated 5.  Anemia  - HGB 7.8. Recent max ESA dose. Continue Fe load.  6.  Metabolic bone disease - Continue binders, VDRA when able to eat. Calcitriol 1.75 TIW.  7.  Nutrition -  Renal diet when able to eat.   NKENGE SONNTAG 01/31/2020, 2:10 PM

## 2020-01-31 NOTE — H&P (Signed)
History and Physical    [name] [name] UGQ:[id number] DOB: 09-27-1951 DOA: 01/31/2020  Referring MD/NP/PA: Marda Stalker, MD PCP: [facility]  Patient coming from: Home  Chief Complaint: Chest pain  I have personally briefly reviewed patient's old medical records in [place]   HPI: [name] [name] is a 69 y.o. female with medical history significant of CAD s/p CABG, combined systolic and diastolic CHF, PVD, ESRD on HD(TTS), DM type II, hyperparathyroidism, asthma, and polysubstance abuse. She presents with complaints of chest pain over the last 3 days. Pain is sharp and stabbing in nature located underneath her left breast. Symptoms have waxed and waned in intensity, but nothing has helped to relieve the pain. The chest pain does not radiate. She has had some associated shortness of breath. Denies any cough, fever, chills, nausea, vomiting, calf pain, leg swelling, or recent sick contacts. Patient was last dialyzed on 1/28. Since that time she has not been able to ambulate due to weakness and complaints of severe pain in her left great toe. She reports that the nail  fell off approximately 2 weeks ago.  Initially she thought symptoms were secondary to a gout flare.  Due to her pain in her foot she has been more sedentary and not getting around as much.  Patient reports living on the second floor of her apartment complex and being unable to get down to go to the bus for hemodialysis.  Patient had just recently been hospitalized last month for pulmonary edema.   ED Course: Upon admission to the emergency department patient was noted to be hypoxic. Labs significant for hemoglobin 7.8, platelets 461, potassium 4.1, BUN 88, creatinine 8.57, and troponin 21.  Chest x-ray is clear.  X-rays of the left foot revealed ulceration of the left first digit without radiographic evidence of osteomyelitis. VQ scan was ordered due to patient's hypoxia. TRH called to  admit.  Review of Systems  Respiratory: Positive for shortness of breath.   Cardiovascular: Positive for chest pain.  Musculoskeletal: Positive for joint pain and myalgias.  Otherwise a complete review of systems was performed and negative except for as noted above in HPI  Past Medical History:  Diagnosis Date  . Abscess   . Acute blood loss anemia 08/17/2019  . Acute respiratory failure ([place]) 10/18/2014  . Acute respiratory failure with hypoxia and hypercapnia ([place]) 06/01/2019  . Anemia 08/2016  . Angiodysplasia of colon   . Arthritis of left shoulder region 03/23/2013  . Bleeding gastrointestinal   . Cardiomegaly 05/2019  . Chest pain 04/17/2016  . Chronic combined systolic and diastolic CHF (congestive heart failure) (HCC)    a. EF 40-45%, mild LVH, mid apicalanteroseptal and apical HK.  . CKD (chronic kidney disease), stage III   . Cocaine abuse ([place])    crack cocaine heavily until 2008 then sporadic use since then  . Coronary artery disease    a. 06/2012 NSTEMI/CABG x 3 (LIMA->LAD, VG->OM2, VG->LCX);  b. 04/2015 MV: EF<30%, mid ant, apicalanterior, apical infarct;  c. 04/2015 Cath: LM nl, LAD 90p, LCX 70m OM1 min irregs, RCA mild dzs, LIMA->LAD nl w/ dist LAD dzs, VG->OM2 nl, VG->LCX nl-->Med Rx.  . CVA (cerebral infarction)    a. right internal capsule stroke in 12/2006  . Demand ischemia ([place]   . Diabetes mellitus    diagnosed in 2008  . Elevated troponin 04/27/2019  . Essential hypertension   . Glaucoma   . Gout   . Heme  positive stool   . HFrEF (heart failure with reduced ejection fraction) ([place])   . Hyperlipidemia   . Hyperparathyroidism, secondary renal ([place])   . Hypertensive crisis 06/02/2019  . Left-sided sensory deficit present   . Lobar pneumonia ([place]) 04/27/2019  . Obesity, morbid ([place])   . Pneumonia   . Pulmonary edema 05/2019  . PVD (peripheral vascular disease) ([place])    a. 06/2012 ABI's: R - 0.73, L - 0.71.  Marland Kitchen Renal mass, right   . Sepsis ([place]) 04/27/2019  .  Shortness of breath dyspnea   . Stroke ([place])   . Thrombocytosis ([place]) 04/17/2016  . Thyroid nodule    FNA in 6378 showed follicular cells but not definate neoplasm  . Tobacco abuse   . Trichomoniasis     Past Surgical History:  Procedure Laterality Date  . AV FISTULA PLACEMENT Left 08/25/2019   Procedure: ARTERIOVENOUS (AV) BRACHIOCEPHALIC FISTULA CREATION;  Surgeon: Waynetta Sandy, MD;  Location: [facility];  Service: Vascular;  Laterality: Left;  . CARDIAC CATHETERIZATION    . CARDIAC CATHETERIZATION N/A 05/17/2015   Procedure: Left Heart Cath and Cors/Grafts Angiography;  Surgeon: [name], MD;  Location: [facility] CV LAB;  Service: Cardiovascular;  Laterality: N/A;  . COLONOSCOPY WITH PROPOFOL N/A 04/21/2016   Procedure: COLONOSCOPY WITH PROPOFOL;  Surgeon: Irene Shipper, MD;  Location: [facility];  Service: Endoscopy;  Laterality: N/A;  . CORONARY ARTERY BYPASS GRAFT  07/09/2012   Procedure: CORONARY ARTERY BYPASS GRAFTING (CABG);  Surgeon: Ivin Poot, MD;  Location: [facility];  Service: Open Heart Surgery;  Laterality: N/A;  . ENTEROSCOPY N/A 10/07/2019   Procedure: ENTEROSCOPY;  Surgeon: Jackquline Denmark, MD;  [facility] ENDOSCOPY;  Service: Endoscopy;  Laterality: N/A;  . ESOPHAGOGASTRODUODENOSCOPY N/A 04/20/2016   Procedure: ESOPHAGOGASTRODUODENOSCOPY (EGD);  Surgeon: Gatha Mayer, MD;  Location: [facility] ENDOSCOPY;  Service: Endoscopy;  Laterality: N/A;  . ESOPHAGOGASTRODUODENOSCOPY (EGD) WITH PROPOFOL N/A 08/17/2019   Procedure: ESOPHAGOGASTRODUODENOSCOPY (EGD) WITH PROPOFOL;  Surgeon: Irene Shipper, MD;  Location: [facility] ENDOSCOPY;  Service: Endoscopy;  Laterality: N/A;  . INSERTION OF DIALYSIS CATHETER Right 08/25/2019   Procedure: INSERTION OF DIALYSIS CATHETER;  Surgeon: Waynetta Sandy, MD;  Location: [place];  Service: Vascular;  Laterality: Right;  . IR FLUORO GUIDE CV LINE RIGHT  08/19/2019  . IR US GUIDE VASC ACCESS RIGHT  08/19/2019  . LEFT HEART CATHETERIZATION  WITH CORONARY ANGIOGRAM N/A 06/29/2012   Procedure: LEFT HEART CATHETERIZATION WITH CORONARY ANGIOGRAM;  Surgeon: Peter M Martinique, MD;  Location[facility] CATH LAB;  Service: Cardiovascular;  Laterality: N/A;  . STERNAL WOUND DEBRIDEMENT  08/17/2012   Procedure: STERNAL WOUND DEBRIDEMENT;  Surgeon: Ivin Poot, MD;  Location: [facility] OR;  Service: Thoracic;  Laterality: N/A;  wound vac application  . STERNAL WOUND DEBRIDEMENT  08/24/2012   Procedure: STERNAL WOUND DEBRIDEMENT;  Surgeon: Ivin Poot, MD;  Location: [facility];  Service: Thoracic;  Laterality: N/A;  . STERNAL WOUND DEBRIDEMENT  09/01/2012   Procedure: STERNAL WOUND DEBRIDEMENT;  Surgeon: Ivin Poot, MD;  Location: [facility];  Service: Thoracic;  Laterality: N/A;  . STERNAL WOUND DEBRIDEMENT  09/20/2012   Procedure: STERNAL WOUND DEBRIDEMENT;  Surgeon: Ivin Poot, MD;  Location[facility] OR;  Service: Thoracic;  Laterality: N/A;  wound vac change  . SUBMUCOSAL TATTOO INJECTION  10/07/2019   Procedure: SUBMUCOSAL TATTOO INJECTION;  Surgeon: Jackquline Denmark, MD;  Location: [facility] ENDOSCOPY;  Service: Endoscopy;;     reports that she has  been smoking cigarettes. She has a 12.50 pack-year smoking history. She has never used smokeless tobacco. She reports current drug use. Drug: Cocaine. She reports that she does not drink alcohol.  Allergies  Allergen Reactions  . Naproxen Rash    Family History  Problem Relation Age of Onset  . Diabetes Mother   . Hypertension Mother   . Cancer Mother   . Hyperlipidemia Father   . Hypertension Father   . Kidney disease Father   . Gout Father   . Cerebrovascular Accident Father   . Other Other        no known family CAD    Prior to Admission medications   Medication Sig Start Date End Date Taking? Authorizing Provider  allopurinol (ZYLOPRIM) 100 MG tablet Take 100 mg by mouth 2 (two) times daily.  11/25/17   [provider]  aspirin EC 325 MG tablet Take 325 mg by mouth daily. 10/28/19   [provider]  brinzolamide (AZOPT) 1 % ophthalmic suspension Place 1 drop into both eyes 2 (two) times a day.     [provider]  buPROPion (WELLBUTRIN SR) 150 MG 12 hr tablet Take 1 tablet (150 mg total) by mouth 2 (two) times daily. 01/16/16   Mikhail, Velta Addison, DO  cetirizine (ZYRTEC) 10 MG tablet Take 10 mg by mouth daily as needed for allergies.     [provider]  ferrous sulfate 325 (65 FE) MG tablet Take 325 mg by mouth 2 (two) times daily with a meal.     [provider]  gabapentin (NEURONTIN) 300 MG capsule Take 1 capsule (300 mg total) by mouth daily. 12/12/19 01/11/20  Domenic Polite, MD  insulin aspart (NOVOLOG) 100 UNIT/ML injection Inject 0-15 Units into the skin 3 (three) times daily with meals. Patient taking differently: Inject 2 Units into the skin 3 (three) times daily with meals.  10/11/19   Nicole Kindred A, DO  ipratropium (ATROVENT HFA) 17 MCG/ACT inhaler Inhale 2 puffs into the lungs every 6 (six) hours as needed for wheezing.    [provider]  latanoprost (XALATAN) 0.005 % ophthalmic solution Place 1 drop into both eyes at bedtime.    [provider]  LINZESS 72 MCG capsule Take 72 mcg by mouth daily before breakfast.  11/21/17   [provider]  metoprolol tartrate (LOPRESSOR) 25 MG tablet Take 0.5 tablets (12.5 mg total) by mouth 2 (two) times daily. Patient taking differently: Take 25 mg by mouth 2 (two) times daily.  10/11/19   Ezekiel Slocumb, DO  mometasone-formoterol (DULERA) 100-5 MCG/ACT AERO Inhale 2 puffs into the lungs every 4 (four) hours as needed for wheezing.    [provider]  nitroGLYCERIN (NITROSTAT) 0.4 MG SL tablet Place 1 tablet (0.4 mg total) under the tongue every 5 (five) minutes as needed for chest pain. 04/30/19   [name], MD  pantoprazole (PROTONIX) 40 MG tablet Take 1 tablet (40 mg total) by mouth daily. 08/26/19   Jeanmarie Hubert, MD  polyethylene glycol (MIRALAX / GLYCOLAX)  17 g packet Take 17 g by mouth daily as needed for mild constipation.     [provider]  pravastatin (PRAVACHOL) 40 MG tablet Take 40 mg by mouth daily. 10/28/19   [provider]  PROAIR HFA 108 (90 Base) MCG/ACT inhaler Inhale 2 puffs into the lungs every 6 (six) hours as needed for wheezing.  11/17/17   [provider]  sodium bicarbonate 650 MG tablet Take 1  tablet (650 mg total) by mouth 3 (three) times daily. 01/02/20   Black, Lezlie Octave, NP  VELPHORO 500 MG chewable tablet Chew 500 mg by mouth 3 (three) times daily with meals. 11/22/19   [provider]  Grant Ruts INHUB 250-50 MCG/DOSE AEPB Inhale 2 puffs into the lungs daily.  05/04/19   [provider]    Physical Exam:  Constitutional: Morbidly obese female who appears to be in no acute distress at this time Vitals:   01/31/20 1027 01/31/20 1029 01/31/20 1030 01/31/20 1215  BP:  120/67 120/67   Pulse:  82  82  Resp:  16  17  Temp:  98.2 F (36.8 C)    SpO2:  100%  99%  Weight: 121.1 kg     Height: '5\' 8"'  (1.727 m)      Eyes: PERRL, lids and conjunctivae normal ENMT: Mucous membranes are dry. Posterior pharynx clear of any exudate or lesions.  Neck: normal, supple, no masses, no thyromegaly Respiratory: Normal respiratory effort with bibasilar crackles appreciated. Patient currently on room air with O2 saturations maintained. Cardiovascular: Regular rate and rhythm, no murmurs / rubs / gallops. No extremity edema. 1+ pedal pulses. No carotid bruits. Right upper extremity fistula in place Abdomen: no tenderness, no masses palpated. No hepatosplenomegaly. Bowel sounds positive.  Musculoskeletal:  No joint deformity upper and lower extremities. Good ROM, no contractures. Normal muscle tone.      Skin: Nail missing of the left first toe with necrotic appearance of the tip of the toenail. Neurologic: CN 2-12 grossly intact. Sensation intact, DTR normal. Strength 5/5 in all 4.  Psychiatric:  Normal judgment and insight. Alert and oriented x 3. Normal mood.     Labs on Admission: I have personally reviewed following labs and imaging studies  CBC: Recent Labs  Lab 01/31/20 1056  WBC 7.9  NEUTROABS 5.7  HGB 7.8*  HCT 27.1*  MCV 86.9  PLT 161*   Basic Metabolic Panel: Recent Labs  Lab 01/31/20 1056  NA 140  K 4.1  CL 101  CO2 23  GLUCOSE 93  BUN 88*  CREATININE 8.57*  CALCIUM 8.0*   GFR: Estimated Creatinine Clearance: 8.5 mL/min (A) (by C-G formula based on SCr of 8.57 mg/dL (H)). Liver Function Tests: Recent Labs  Lab 01/31/20 1056  AST 12*  ALT 10  ALKPHOS 111  BILITOT 0.3  PROT 6.5  ALBUMIN 2.8*   Recent Labs  Lab 01/31/20 1056  LIPASE 26   No results for input(s): AMMONIA in the last 168 hours. Coagulation Profile: No results for input(s): INR, PROTIME in the last 168 hours. Cardiac Enzymes: No results for input(s): CKTOTAL, CKMB, CKMBINDEX, TROPONINI in the last 168 hours. BNP (last 3 results) No results for input(s): PROBNP in the last 8760 hours. HbA1C: No results for input(s): HGBA1C in the last 72 hours. CBG: No results for input(s): GLUCAP in the last 168 hours. Lipid Profile: No results for input(s): CHOL, HDL, LDLCALC, TRIG, CHOLHDL, LDLDIRECT in the last 72 hours. Thyroid Function Tests: No results for input(s): TSH, T4TOTAL, FREET4, T3FREE, THYROIDAB in the last 72 hours. Anemia Panel: No results for input(s): VITAMINB12, FOLATE, FERRITIN, TIBC, IRON, RETICCTPCT in the last 72 hours. Urine analysis:    Component Value Date/Time   COLORURINE YELLOW 08/19/2019 0712   APPEARANCEUR HAZY (A) 08/19/2019 0712   LABSPEC 1.012 08/19/2019 0712   PHURINE 5.0 08/19/2019 0712   GLUCOSEU NEGATIVE 08/19/2019 0712   HGBUR NEGATIVE 08/19/2019 0712   BILIRUBINUR  NEGATIVE 08/19/2019 0712   KETONESUR NEGATIVE 08/19/2019 0712   PROTEINUR NEGATIVE 08/19/2019 0712   UROBILINOGEN 0.2 05/12/2015 0938   NITRITE NEGATIVE 08/19/2019 0712    LEUKOCYTESUR SMALL (A) 08/19/2019 0712   Sepsis Labs: Recent Results (from the past 240 hour(s))  Respiratory Panel by RT PCR (Flu A&B, Covid) - Nasopharyngeal Swab     Status: None   Collection Time: 01/31/20 12:01 PM   Specimen: Nasopharyngeal Swab  Result Value Ref Range Status   SARS Coronavirus 2 by RT PCR NEGATIVE NEGATIVE Final    Comment: (NOTE) SARS-CoV-2 target nucleic acids are NOT DETECTED. The SARS-CoV-2 RNA is generally detectable in upper respiratoy specimens during the acute phase of infection. The lowest concentration of SARS-CoV-2 viral copies this assay can detect is 131 copies/mL. A negative result does not preclude SARS-Cov-2 infection and should not be used as the sole basis for treatment or other patient management decisions. A negative result may occur with  improper specimen collection/handling, submission of specimen other than nasopharyngeal swab, presence of viral mutation(s) within the areas targeted by this assay, and inadequate number of viral copies (<131 copies/mL). A negative result must be combined with clinical observations, patient history, and epidemiological information. The expected result is Negative. Fact Sheet for Patients:  PinkCheek.be Fact Sheet for Healthcare Providers:  GravelBags.it This test is not yet ap proved or cleared by the [place] FDA and  has been authorized for detection and/or diagnosis of SARS-CoV-2 by FDA under an Emergency Use Authorization (EUA). This EUA will remain  in effect (meaning this test can be used) for the duration of the COVID-19 declaration under Section 564(b)(1) of the Act, 21 U.S.C. section 360bbb-3(b)(1), unless the authorization is terminated or revoked sooner.    Influenza A by PCR NEGATIVE NEGATIVE Final   Influenza B by PCR NEGATIVE NEGATIVE Final    Comment: (NOTE) The Xpert Xpress SARS-CoV-2/FLU/RSV assay is intended as an aid in    the diagnosis of influenza from Nasopharyngeal swab specimens and  should not be used as a sole basis for treatment. Nasal washings and  aspirates are unacceptable for Xpert Xpress SARS-CoV-2/FLU/RSV  testing. Fact Sheet for Patients: PinkCheek.be Fact Sheet for Healthcare Providers: GravelBags.it This test is not yet approved or cleared by the [place] FDA and  has been authorized for detection and/or diagnosis of SARS-CoV-2 by  FDA under an Emergency Use Authorization (EUA). This EUA will remain  in effect (meaning this test can be used) for the duration of the  Covid-19 declaration under Section 564(b)(1) of the Act, 21  U.S.C. section 360bbb-3(b)(1), unless the authorization is  terminated or revoked. Performed at [facility], [place] [address], [place], [place] [address]      Radiological Exams on Admission: DG Chest Portable 1 View  Result Date: 01/31/2020 CLINICAL DATA:  End-stage renal disease, chest pain and left lower extremity swelling EXAM: PORTABLE CHEST 1 VIEW COMPARISON:  01/01/2020 FINDINGS: Single frontal view of the chest demonstrates a right internal jugular dialysis catheter tip overlying superior vena cava. Postsurgical changes from median sternotomy. Cardiac silhouette is enlarged but stable. Mild central vascular congestion is chronic, with no airspace disease, effusion, or pneumothorax. IMPRESSION: 1. Stable exam, no acute process. Electronically Signed   By: Randa Ngo M.D.   On: 01/31/2020 11:41   DG Foot Complete Left  Result Date: 01/31/2020 CLINICAL DATA:  Left foot swelling, left great toe pain and skin abnormality EXAM: LEFT FOOT - COMPLETE 3+ VIEW COMPARISON:  None. FINDINGS: Frontal, oblique, lateral views of the left foot are obtained. The bones are diffusely osteopenic. Prominent enthesopathic changes of the calcaneus. Joint spaces are relatively well preserved. There is diffuse soft  tissue swelling of the forefoot. Evidence of soft tissue ulceration medial aspect first digit. I do not see any underlying bony destruction or periosteal reaction to suggest osteomyelitis. IMPRESSION: 1. Soft tissue swelling, with ulceration of the first digit. No underlying radiographic evidence of osteomyelitis. If osteomyelitis remains a clinical concern, three-phase bone scan or MRI could be considered. Electronically Signed   By: Randa Ngo M.D.   On: 01/31/2020 11:41    EKG: Independently reviewed.  Sinus rhythm 83 bpm  Assessment/Plan Chest pain: Acute. Patient presents with complaints of left-sided chest pain that did not radiate. High-sensitivity troponin 21-> 19. Chest x-ray showed cardiomegaly with vascular congestion that appeared more so chronic. -Admit to a telemetry bed -Nitroglycerin as needed for chest pain -Continue aspirin and statin  Left toe pain, diabetic foot ulcer: Acute. Patient presents with complaints of worsening left first digit pain after toenail fell off 2 weeks ago. Pulses 1+ in the lower extremities.  X-ray showed soft tissue swelling near ulceration.  Tip of left toenail appears necrotic. -Check ESR, CRP, prealbumin -Follow-up blood culture -Check TBI  -Consider need of MRI of the left foot in a.m. -Empiric antibiotics of vancomycin, Rocephin, and metronidazole -Formally consult vascular surgery if TBI seen to be abnormal versus orthopedic surgery  ESRD on HD: Patient had recently missed her last 2 hemodialysis sessions due to lower extremity weakness and left foot pain.  Chest x-ray did not show any gross signs of pulmonary edema during this hospitalization.  Nephrology was formally consulted and the patient for dialysis.  -HD per nephrology  Elevated troponin: Chronic.  Initial troponin 21->19. -Follow-up telemetry  Coronary artery disease: Patient with prior history of CABG in 2013. -Continue aspirin  Insulin-dependent diabetes mellitus: Last  hemoglobin A1c noted to be 5 on 01/01/2020. Patient currently on 2 units of insulin with meals. -Hypoglycemic protocols -Continue current insulin regimen 2 units with meals -CBGs before every meal and at bedtime -Adjust insulin regimen as needed  Anemia of chronic kidney disease: Hemoglobin 7.8 which appears near patient's baseline -Continue iron supplement  Hyperlipidemia -Continue pravastatin  Anxiety/depression -Continue Wellbutrin  Essential hypertension: Stable. -Continue current home regimen  GERD -Continue Protonix  Morbid obesity: BMI 40.59 kg/m  DVT prophylaxis: Heparin Code Status: Full Family Communication: No family present bedside Disposition Plan: Likely discharge home once medically stable Consults called: Nephrology Admission status: Inpatient  Norval Morton MD [facility] Pager [phone]   If 7PM-7AM, please contact night-coverage [email or web address] Password [facility]  01/31/2020, 1:57 PM

## 2020-02-01 ENCOUNTER — Encounter (HOSPITAL_COMMUNITY): Payer: Self-pay | Admitting: *Deleted

## 2020-02-01 ENCOUNTER — Inpatient Hospital Stay (HOSPITAL_COMMUNITY): Payer: Medicare (Managed Care)

## 2020-02-01 DIAGNOSIS — I96 Gangrene, not elsewhere classified: Secondary | ICD-10-CM

## 2020-02-01 DIAGNOSIS — R072 Precordial pain: Secondary | ICD-10-CM

## 2020-02-01 LAB — GLUCOSE, CAPILLARY
Glucose-Capillary: 110 mg/dL — ABNORMAL HIGH (ref 70–99)
Glucose-Capillary: 165 mg/dL — ABNORMAL HIGH (ref 70–99)
Glucose-Capillary: 84 mg/dL (ref 70–99)
Glucose-Capillary: 137 mg/dL — ABNORMAL HIGH (ref 70–99)

## 2020-02-01 LAB — CBC
HCT: 25 % — ABNORMAL LOW (ref 36.0–46.0)
Hemoglobin: 7 g/dL — ABNORMAL LOW (ref 12.0–15.0)
MCH: 24.6 pg — ABNORMAL LOW (ref 26.0–34.0)
MCHC: 28 g/dL — ABNORMAL LOW (ref 30.0–36.0)
MCV: 87.7 fL (ref 80.0–100.0)
Platelets: 377 10*3/uL (ref 150–400)
RBC: 2.85 MIL/uL — ABNORMAL LOW (ref 3.87–5.11)
RDW: 21.6 % — ABNORMAL HIGH (ref 11.5–15.5)
WBC: 8 10*3/uL (ref 4.0–10.5)
nRBC: 3.9 % — ABNORMAL HIGH (ref 0.0–0.2)

## 2020-02-01 LAB — RENAL FUNCTION PANEL
Albumin: 2.5 g/dL — ABNORMAL LOW (ref 3.5–5.0)
Anion gap: 15 (ref 5–15)
BUN: 40 mg/dL — ABNORMAL HIGH (ref 8–23)
CO2: 25 mmol/L (ref 22–32)
Calcium: 7.9 mg/dL — ABNORMAL LOW (ref 8.9–10.3)
Chloride: 101 mmol/L (ref 98–111)
Creatinine, Ser: 5.07 mg/dL — ABNORMAL HIGH (ref 0.44–1.00)
GFR calc Af Amer: 9 mL/min — ABNORMAL LOW (ref >60)
GFR calc non Af Amer: 8 mL/min — ABNORMAL LOW (ref >60)
Glucose, Bld: 112 mg/dL — ABNORMAL HIGH (ref 70–99)
Phosphorus: 4.4 mg/dL (ref 2.5–4.6)
Potassium: 4.7 mmol/L (ref 3.5–5.1)
Sodium: 141 mmol/L (ref 135–145)

## 2020-02-01 LAB — SEDIMENTATION RATE: Sed Rate: 60 mm/hr — ABNORMAL HIGH (ref 0–22)

## 2020-02-01 LAB — MRSA PCR SCREENING: MRSA by PCR: NEGATIVE

## 2020-02-01 MED ORDER — INSULIN ASPART 100 UNIT/ML ~~LOC~~ SOLN
0.0000 [IU] | Freq: Three times a day (TID) | SUBCUTANEOUS | Status: DC
  Administered 2020-02-01: 1 [IU] via SUBCUTANEOUS
  Administered 2020-02-02 – 2020-02-04 (×2): 2 [IU] via SUBCUTANEOUS
  Administered 2020-02-05: 4 [IU] via SUBCUTANEOUS
  Administered 2020-02-05 – 2020-02-06 (×2): 1 [IU] via SUBCUTANEOUS
  Administered 2020-02-07: 3 [IU] via SUBCUTANEOUS
  Administered 2020-02-08 – 2020-02-10 (×6): 1 [IU] via SUBCUTANEOUS
  Administered 2020-02-10: 2 [IU] via SUBCUTANEOUS

## 2020-02-01 NOTE — Consult Note (Signed)
WOC Nurse Consult Note: Patient receiving care in [facility] [phone].  Consult completed remotely after review of record, including images of left foot. Reason for Consult: LE wound Wound type: dry, black eschar of left toe tip Pressure Injury POA: Yes/No/NA Measurement: See photo Wound bed: dry, stable, black eschar Drainage (amount, consistency, odor) none Periwound: edematous. See xray results from yesterday. Dressing procedure/placement/frequency:  Apply iodine (from the swabsticks or swab pads in clean utility) to the left great toe. Allow to air dry. Leave open to air. If the physician is concerned about possible osteomyelitis, please see Radiologist report and consideration for MRI of foot. Monitor the wound area(s) for worsening of condition such as: Signs/symptoms of infection,  Increase in size,  Development of or worsening of odor, Development of pain, or increased pain at the affected locations.  Notify the medical team if any of these develop.  Thank you for the consult. [facility] nurse will not follow at this time.  Please re-consult the [facility] team if needed.  Val Riles, RN, MSN, CWOCN, CNS-BC, pager [phone]

## 2020-02-01 NOTE — Progress Notes (Addendum)
Subjective:  Feels better after 3 l uf hd yesterday .No CP this am , L grt toe discomfort,  This am requesting SNHP   Objective Vital signs in last 24 hours: Vitals:   01/31/20 2246 02/01/20 0420 02/01/20 0907 02/01/20 0959  BP: 121/79 (!) 119/48  121/63  Pulse: 79 73  75  Resp: 16 16  18   Temp: 99 F (37.2 C) 99.5 F (37.5 C)  99.5 F (37.5 C)  TempSrc: Oral Oral  Oral  SpO2: 100% 97% 98% 99%  Weight:  121 kg    Height:       Weight change:   Physical Exam: General: Alert obese Female NAD  OX3 Heart: RRR  Lungs: CTA , unlabored breathing  Abdomen: Obese, soft, NT, ND Extremities: Trace pedal edema , Left grt Toe  Tip  Ulcer  Dry Eschar  Dialysis Access: perm cath   Dialysis Orders:[facility] T,Th,S 4 hours 15 min 180NRe 400/800 118 kg 2.0 K/ 2.0 Ca TDC/AVF -No heparin -Mircera 225 mcg IV q 2 weeks  -Venofer 100 mg IV qtx through 2/11 -Calcitriol 1.75 mcg PO TIW   Problem/Plan: 1. Acute Hypoxic Respiratory failure/Volume Overload: HD yesterday ev eng with 3  UF  tolerated.  next  Hd tomor  Needs stand wt  2. Chest pain: per primary, recent similar presentation. -resolves this am  3. ESRD - T,Th,S via TDC. K+ 4.7, No heparin. 4. Hypertension/volume - bp stable after hd uf  Last pm  UF to EDW as tolerated 5. Anemia - HGB 7.8.>7.0 this am  Recent max ESA dose. Continue Fe load.  6. Metabolic bone disease - Continue binders, Calcitriol 1.75 TIW. Phos 4.4  Ca  Ok   7. Nutrition -  carb mod /Renal diet when able to eat. 8. Left grt toe  Diabetic Ulcer = wu per admit . On empiric Vanco, Rocephin, Metronidazole  9. Debilitation = she requests NHP/  Admit rx  [name], PA-C [facility] Beeper [phone] 02/01/2020,10:42 AM  LOS: 1 day   Labs: Basic Metabolic Panel: Recent Labs  Lab 01/31/20 1056 02/01/20 0528  NA 140 141  K 4.1 4.7  CL 101 101  CO2 23 25  GLUCOSE 93 112*  BUN 88* 40*  CREATININE 8.57* 5.07*  CALCIUM 8.0* 7.9*  PHOS  --   4.4   Liver Function Tests: Recent Labs  Lab 01/31/20 1056 02/01/20 0528  AST 12*  --   ALT 10  --   ALKPHOS 111  --   BILITOT 0.3  --   PROT 6.5  --   ALBUMIN 2.8* 2.5*   Recent Labs  Lab 01/31/20 1056  LIPASE 26   No results for input(s): AMMONIA in the last 168 hours. CBC: Recent Labs  Lab 01/31/20 1056 02/01/20 0528  WBC 7.9 8.0  NEUTROABS 5.7  --   HGB 7.8* 7.0*  HCT 27.1* 25.0*  MCV 86.9 87.7  PLT 461* 377   Cardiac Enzymes: No results for input(s): CKTOTAL, CKMB, CKMBINDEX, TROPONINI in the last 168 hours. CBG: Recent Labs  Lab 01/31/20 2248 02/01/20 0650  GLUCAP 222* 110*    Studies/Results: NM PULMONARY VENT AND PERF (V/Q Scan)  Result Date: 01/31/2020 CLINICAL DATA:  Nonradiating chest pain for 3 days. EXAM: NUCLEAR MEDICINE VENTILATION - PERFUSION LUNG SCAN TECHNIQUE: Ventilation images were obtained in multiple projections using inhaled aerosol Tc-34m DTPA. Perfusion images were obtained in multiple projections after intravenous injection of Tc-24m MAA. RADIOPHARMACEUTICALS:  41.2 mCi of Tc-48m  DTPA aerosol inhalation and 1.5 mCi Tc39m MAA IV COMPARISON:  April 28, 2019 FINDINGS: Ventilation: No focal ventilation defect. Perfusion: No wedge shaped peripheral perfusion defects to suggest acute pulmonary embolism. IMPRESSION: 1. Normal nuclear medicine ventilation/perfusion scan. Electronically Signed   By: Virgina Norfolk M.D.   On: 01/31/2020 20:34   DG Chest Portable 1 View  Result Date: 01/31/2020 CLINICAL DATA:  End-stage renal disease, chest pain and left lower extremity swelling EXAM: PORTABLE CHEST 1 VIEW COMPARISON:  01/01/2020 FINDINGS: Single frontal view of the chest demonstrates a right internal jugular dialysis catheter tip overlying superior vena cava. Postsurgical changes from median sternotomy. Cardiac silhouette is enlarged but stable. Mild central vascular congestion is chronic, with no airspace disease, effusion, or pneumothorax.  IMPRESSION: 1. Stable exam, no acute process. Electronically Signed   By: Randa Ngo M.D.   On: 01/31/2020 11:41   DG Foot Complete Left  Result Date: 01/31/2020 CLINICAL DATA:  Left foot swelling, left great toe pain and skin abnormality EXAM: LEFT FOOT - COMPLETE 3+ VIEW COMPARISON:  None. FINDINGS: Frontal, oblique, lateral views of the left foot are obtained. The bones are diffusely osteopenic. Prominent enthesopathic changes of the calcaneus. Joint spaces are relatively well preserved. There is diffuse soft tissue swelling of the forefoot. Evidence of soft tissue ulceration medial aspect first digit. I do not see any underlying bony destruction or periosteal reaction to suggest osteomyelitis. IMPRESSION: 1. Soft tissue swelling, with ulceration of the first digit. No underlying radiographic evidence of osteomyelitis. If osteomyelitis remains a clinical concern, three-phase bone scan or MRI could be considered. Electronically Signed   By: Randa Ngo M.D.   On: 01/31/2020 11:41   Medications: . cefTRIAXone (ROCEPHIN)  IV 2 g (01/31/20 2033)  . ferric gluconate (FERRLECIT/NULECIT) IV Stopped (01/31/20 1801)  . metronidazole 500 mg (02/01/20 0341)  . [START ON 02/02/2020] vancomycin     . allopurinol  100 mg Oral BID  . aspirin EC  325 mg Oral Daily  . brinzolamide  1 drop Both Eyes BID  . buPROPion  150 mg Oral BID  . Chlorhexidine Gluconate Cloth  6 each Topical Q0600  . ferrous sulfate  325 mg Oral BID WC  . gabapentin  300 mg Oral Daily  . heparin  7,500 Units Subcutaneous Q8H  . insulin aspart  0-6 Units Subcutaneous TID WC  . latanoprost  1 drop Both Eyes QHS  . linaclotide  72 mcg Oral QAC breakfast  . metoprolol tartrate  25 mg Oral BID  . mometasone-formoterol  2 puff Inhalation BID  . pantoprazole  40 mg Oral Daily  . pravastatin  40 mg Oral Daily  . sodium bicarbonate  650 mg Oral TID  . sodium chloride flush  3 mL Intravenous Q12H  . sucroferric oxyhydroxide  500 mg  Oral TID WC

## 2020-02-01 NOTE — Progress Notes (Signed)
ABI's have been completed. Preliminary results can be found in CV Proc through chart review.   02/01/20 2:53 PM [name] [name] RVT

## 2020-02-01 NOTE — Progress Notes (Signed)
PROGRESS NOTE    [name] [name]  GNO:[id number] DOB: 1951/01/28 DOA: 01/31/2020 PCP: [facility]     Brief Narrative:  [name] [name] is a 69 y.o. female with medical history significant of CAD s/p CABG, combined systolic and diastolic CHF, PVD, ESRD on HD (TTS), DM type II, hyperparathyroidism, asthma, polysubstance abuse, tobacco abuse. She presents with complaints of chest pain over the last 3 days. Pain is sharp and stabbing in nature located underneath her left breast. Symptoms have waxed and waned in intensity, but nothing has helped to relieve the pain. The chest pain does not radiate. She has had some associated shortness of breath. Patient was last dialyzed on 1/28. Since that time she has not been able to ambulate due to weakness and complaints of severe pain in her left great toe. She reports that the nail fell off approximately 2 weeks ago.  Initially she thought symptoms were secondary to a gout flare. Due to her pain in her foot she has been more sedentary and not getting around as much. Patient had just recently been hospitalized last month for pulmonary edema.   New events last 24 hours / Subjective: Continues to have sharp left-sided chest pain underneath her left breast.   Assessment & Plan:   Principal Problem:   Chest pain Active Problems:   Hyperlipidemia   Essential hypertension   Coronary artery disease   S/P CABG (coronary artery bypass graft)   Type 2 diabetes mellitus with chronic kidney disease on chronic dialysis, with long-term current use of insulin (HCC)   Obesity, Class III, BMI 40-49.9 (morbid obesity) (HCC)   ESRD (end stage renal disease) (HCC)   Depression   Chest pain, likely musculoskeletal in nature -Troponin 21 --> 19  Left great toe diabetic foot ulcer  -X-ray: Soft tissue swelling, with ulceration of the first digit. No underlying radiographic evidence of osteomyelitis -CRP 2.3, sed rate 60  -Blood cultures pending    -ABI to rule out ischemia pending  -MRI to rule out osteo pending  -Empiric vancomycin, Rocephin, metronidazole  ESRD on hemodialysis -Nephrology following for dialysis  CAD, status post CABG 2013 -Continue aspirin, pravastatin  Hypertension -Continue Lopressor  Type 2 diabetes, insulin-dependent, well controlled -Hemoglobin A1c 5.0 in Jan 2021 -Sliding scale insulin  Hyperlipidemia -Continue pravastatin  Depression, anxiety -Continue Wellbutrin  GERD -Continue Protonix  Tobacco abuse -Cessation counseling, states that she currently smokes about 6 cigarettes daily, has been smoking since she was 69 yo    DVT prophylaxis: Subcutaneous heparin  Code Status: Full code Family Communication: None at bedside  Disposition Plan: Patient is from home prior to admission. Currently in-hospital treatment needed due to work-up for left great toe wound. Barrier(s) to discharge include continued medical work-up and suspect patient will discharge to home versus SNF.  Patient states that her husband can no longer take care of her.  PT and TOC consulted.    Consultants:   Nephrology  Procedures:   None  Antimicrobials:  Anti-infectives (From admission, onward)   Start     Dose/Rate Route Frequency Ordered Stop   02/02/20 1200  vancomycin (VANCOCIN) IVPB 1000 mg/200 mL premix     1,000 mg 200 mL/hr over 60 Minutes Intravenous Every T-Th-Sa (Hemodialysis) 01/31/20 1822     01/31/20 2030  cefTRIAXone (ROCEPHIN) 2 g in sodium chloride 0.9 % 100 mL IVPB     2 g 200 mL/hr over 30 Minutes Intravenous Every 24 hours 01/31/20 1749  01/31/20 2000  metroNIDAZOLE (FLAGYL) IVPB 500 mg     500 mg 100 mL/hr over 60 Minutes Intravenous Every 8 hours 01/31/20 1749     01/31/20 1930  vancomycin (VANCOREADY) IVPB 2000 mg/400 mL     2,000 mg 200 mL/hr over 120 Minutes Intravenous  Once 01/31/20 1822 02/01/20 0200        Objective: Vitals:   01/31/20 1830 01/31/20 2246 02/01/20 0420  02/01/20 0907  BP: (!) 108/53 121/79 (!) 119/48   Pulse: 70 79 73   Resp: 18 16 16    Temp: 98.5 F (36.9 C) 99 F (37.2 C) 99.5 F (37.5 C)   TempSrc: Oral Oral Oral   SpO2: 98% 100% 97% 98%  Weight:   121 kg   Height:        Intake/Output Summary (Last 24 hours) at 02/01/2020 0934 Last data filed at 02/01/2020 0900 Gross per 24 hour  Intake 1040 ml  Output 3300 ml  Net -2260 ml   Filed Weights   01/31/20 1027 01/31/20 1430 02/01/20 0420  Weight: 121.1 kg 121.1 kg 121 kg    Examination:  General exam: Appears calm and comfortable  Respiratory system: Bilateral expiratory wheezes without any distress Cardiovascular system: S1 & S2 heard, RRR. No murmurs. No pedal edema. Gastrointestinal system: Abdomen is nondistended, soft and nontender. Normal bowel sounds heard. Central nervous system: Alert and oriented. No focal neurological deficits. Speech clear.  Extremities: Symmetric in appearance  Skin: Left great toe with eschar of the toe tip Psychiatry: Judgement and insight appear normal. Mood & affect appropriate.   Data Reviewed: I have personally reviewed following labs and imaging studies  CBC: Recent Labs  Lab 01/31/20 1056 02/01/20 0528  WBC 7.9 8.0  NEUTROABS 5.7  --   HGB 7.8* 7.0*  HCT 27.1* 25.0*  MCV 86.9 87.7  PLT 461* 284   Basic Metabolic Panel: Recent Labs  Lab 01/31/20 1056 02/01/20 0528  NA 140 141  K 4.1 4.7  CL 101 101  CO2 23 25  GLUCOSE 93 112*  BUN 88* 40*  CREATININE 8.57* 5.07*  CALCIUM 8.0* 7.9*  PHOS  --  4.4   GFR: Estimated Creatinine Clearance: 14.3 mL/min (A) (by C-G formula based on SCr of 5.07 mg/dL (H)). Liver Function Tests: Recent Labs  Lab 01/31/20 1056 02/01/20 0528  AST 12*  --   ALT 10  --   ALKPHOS 111  --   BILITOT 0.3  --   PROT 6.5  --   ALBUMIN 2.8* 2.5*   Recent Labs  Lab 01/31/20 1056  LIPASE 26   No results for input(s): AMMONIA in the last 168 hours. Coagulation Profile: No results for  input(s): INR, PROTIME in the last 168 hours. Cardiac Enzymes: No results for input(s): CKTOTAL, CKMB, CKMBINDEX, TROPONINI in the last 168 hours. BNP (last 3 results) No results for input(s): PROBNP in the last 8760 hours. HbA1C: No results for input(s): HGBA1C in the last 72 hours. CBG: Recent Labs  Lab 01/31/20 2248 02/01/20 0650  GLUCAP 222* 110*   Lipid Profile: No results for input(s): CHOL, HDL, LDLCALC, TRIG, CHOLHDL, LDLDIRECT in the last 72 hours. Thyroid Function Tests: No results for input(s): TSH, T4TOTAL, FREET4, T3FREE, THYROIDAB in the last 72 hours. Anemia Panel: No results for input(s): VITAMINB12, FOLATE, FERRITIN, TIBC, IRON, RETICCTPCT in the last 72 hours. Sepsis Labs: Recent Labs  Lab 01/31/20 1116 01/31/20 1330  LATICACIDVEN 1.5 1.2    Recent Results (from the past  240 hour(s))  Respiratory Panel by RT PCR (Flu A&B, Covid) - Nasopharyngeal Swab     Status: None   Collection Time: 01/31/20 12:01 PM   Specimen: Nasopharyngeal Swab  Result Value Ref Range Status   SARS Coronavirus 2 by RT PCR NEGATIVE NEGATIVE Final    Comment: (NOTE) SARS-CoV-2 target nucleic acids are NOT DETECTED. The SARS-CoV-2 RNA is generally detectable in upper respiratoy specimens during the acute phase of infection. The lowest concentration of SARS-CoV-2 viral copies this assay can detect is 131 copies/mL. A negative result does not preclude SARS-Cov-2 infection and should not be used as the sole basis for treatment or other patient management decisions. A negative result may occur with  improper specimen collection/handling, submission of specimen other than nasopharyngeal swab, presence of viral mutation(s) within the areas targeted by this assay, and inadequate number of viral copies (<131 copies/mL). A negative result must be combined with clinical observations, patient history, and epidemiological information. The expected result is Negative. Fact Sheet for Patients:    PinkCheek.be Fact Sheet for Healthcare Providers:  GravelBags.it This test is not yet ap proved or cleared by the [place] FDA and  has been authorized for detection and/or diagnosis of SARS-CoV-2 by FDA under an Emergency Use Authorization (EUA). This EUA will remain  in effect (meaning this test can be used) for the duration of the COVID-19 declaration under Section 564(b)(1) of the Act, 21 U.S.C. section 360bbb-3(b)(1), unless the authorization is terminated or revoked sooner.    Influenza A by PCR NEGATIVE NEGATIVE Final   Influenza B by PCR NEGATIVE NEGATIVE Final    Comment: (NOTE) The Xpert Xpress SARS-CoV-2/FLU/RSV assay is intended as an aid in  the diagnosis of influenza from Nasopharyngeal swab specimens and  should not be used as a sole basis for treatment. Nasal washings and  aspirates are unacceptable for Xpert Xpress SARS-CoV-2/FLU/RSV  testing. Fact Sheet for Patients: PinkCheek.be Fact Sheet for Healthcare Providers: GravelBags.it This test is not yet approved or cleared by the [place] FDA and  has been authorized for detection and/or diagnosis of SARS-CoV-2 by  FDA under an Emergency Use Authorization (EUA). This EUA will remain  in effect (meaning this test can be used) for the duration of the  Covid-19 declaration under Section 564(b)(1) of the Act, 21  U.S.C. section 360bbb-3(b)(1), unless the authorization is  terminated or revoked. Performed at [facility], [place] [address] [place], [place] [address]   MRSA PCR Screening     Status: None   Collection Time: 02/01/20 12:00 AM   Specimen: Nasopharyngeal  Result Value Ref Range Status   MRSA by PCR NEGATIVE NEGATIVE Final    Comment:        The GeneXpert MRSA Assay (FDA approved for NASAL specimens only), is one component of a comprehensive MRSA colonization surveillance  program. It is not intended to diagnose MRSA infection nor to guide or monitor treatment for MRSA infections. Performed at [facility], [place] [address], [place], [place] [address]       Radiology Studies: NM PULMONARY VENT AND PERF (V/Q Scan)  Result Date: 01/31/2020 CLINICAL DATA:  Nonradiating chest pain for 3 days. EXAM: NUCLEAR MEDICINE VENTILATION - PERFUSION LUNG SCAN TECHNIQUE: Ventilation images were obtained in multiple projections using inhaled aerosol Tc-36m DTPA. Perfusion images were obtained in multiple projections after intravenous injection of Tc-74m MAA. RADIOPHARMACEUTICALS:  41.2 mCi of Tc-4m DTPA aerosol inhalation and 1.5 mCi Tc30m MAA IV COMPARISON:  April 28, 2019 FINDINGS: Ventilation: No focal ventilation defect. Perfusion: No wedge shaped peripheral perfusion defects to suggest acute pulmonary embolism. IMPRESSION: 1. Normal nuclear medicine ventilation/perfusion scan. Electronically Signed   By: Virgina Norfolk M.D.   On: 01/31/2020 20:34   DG Chest Portable 1 View  Result Date: 01/31/2020 CLINICAL DATA:  End-stage renal disease, chest pain and left lower extremity swelling EXAM: PORTABLE CHEST 1 VIEW COMPARISON:  01/01/2020 FINDINGS: Single frontal view of the chest demonstrates a right internal jugular dialysis catheter tip overlying superior vena cava. Postsurgical changes from median sternotomy. Cardiac silhouette is enlarged but stable. Mild central vascular congestion is chronic, with no airspace disease, effusion, or pneumothorax. IMPRESSION: 1. Stable exam, no acute process. Electronically Signed   By: Randa Ngo M.D.   On: 01/31/2020 11:41   DG Foot Complete Left  Result Date: 01/31/2020 CLINICAL DATA:  Left foot swelling, left great toe pain and skin abnormality EXAM: LEFT FOOT - COMPLETE 3+ VIEW COMPARISON:  None. FINDINGS: Frontal, oblique, lateral views of the left foot are obtained. The bones are diffusely osteopenic. Prominent enthesopathic  changes of the calcaneus. Joint spaces are relatively well preserved. There is diffuse soft tissue swelling of the forefoot. Evidence of soft tissue ulceration medial aspect first digit. I do not see any underlying bony destruction or periosteal reaction to suggest osteomyelitis. IMPRESSION: 1. Soft tissue swelling, with ulceration of the first digit. No underlying radiographic evidence of osteomyelitis. If osteomyelitis remains a clinical concern, three-phase bone scan or MRI could be considered. Electronically Signed   By: Randa Ngo M.D.   On: 01/31/2020 11:41      Scheduled Meds: . allopurinol  100 mg Oral BID  . aspirin EC  325 mg Oral Daily  . brinzolamide  1 drop Both Eyes BID  . buPROPion  150 mg Oral BID  . Chlorhexidine Gluconate Cloth  6 each Topical Q0600  . ferrous sulfate  325 mg Oral BID WC  . gabapentin  300 mg Oral Daily  . heparin  7,500 Units Subcutaneous Q8H  . insulin aspart  2 Units Subcutaneous TID WC  . latanoprost  1 drop Both Eyes QHS  . linaclotide  72 mcg Oral QAC breakfast  . metoprolol tartrate  25 mg Oral BID  . mometasone-formoterol  2 puff Inhalation BID  . pantoprazole  40 mg Oral Daily  . pravastatin  40 mg Oral Daily  . sodium bicarbonate  650 mg Oral TID  . sodium chloride flush  3 mL Intravenous Q12H  . sucroferric oxyhydroxide  500 mg Oral TID WC   Continuous Infusions: . cefTRIAXone (ROCEPHIN)  IV 2 g (01/31/20 2033)  . ferric gluconate (FERRLECIT/NULECIT) IV Stopped (01/31/20 1801)  . metronidazole 500 mg (02/01/20 0341)  . [START ON 02/02/2020] vancomycin       LOS: 1 day      Time spent: 35 minutes   Dessa Phi, DO [facility] 02/01/2020, 9:34 AM   Available via Epic secure chat 7am-7pm After these hours, please refer to coverage provider listed on [email or web address]

## 2020-02-01 NOTE — Plan of Care (Signed)
  Problem: Education: Goal: Knowledge of General Education information will improve Description Including pain rating scale, medication(s)/side effects and non-pharmacologic comfort measures Outcome: Progressing   Problem: Health Behavior/Discharge Planning: Goal: Ability to manage health-related needs will improve Outcome: Progressing   

## 2020-02-01 NOTE — Progress Notes (Signed)
New Admission Note:   Arrival Method: via stretcher from ED Mental Orientation: A&Ox4 Telemetry: Box 7, CCMD notified Assessment: to be completed Skin: left great toe ulcer, refer to flowsheet IV: RFA, SL Pain: 0/10 Tubes: None Safety Measures: Safety Fall Prevention Plan has been discussed  Admission: to be completed 5 [place] Orientation: Patient has been orientated to the room, unit and staff.   Family: none at bedside  Orders to be reviewed and implemented. Will continue to monitor the patient. Call light has been placed within reach and bed alarm has been activated.

## 2020-02-01 NOTE — Evaluation (Signed)
Physical Therapy Evaluation Patient Details Name: [name] [name] MRN: [id number] DOB: 10/14/1951 Today's Date: 02/01/2020   History of Present Illness  [name] [name] is a 69 y.o. female with medical history significant of CAD s/p CABG, combined systolic and diastolic CHF, PVD, ESRD on HD (TTS), DM type II, hyperparathyroidism, asthma, polysubstance abuse, tobacco abuse. She presents with complaints of chest pain over the last 3 days, likely musculoskeletal in nature. Also with left great toe diabetic foot ulcer; pending MRI to rule out osteomyelitis.  Clinical Impression  Pt admitted with above. Prior to admission, pt lives with her husband, ambulates with a quad cane, and has been having increasing difficulty with transfers and ADL's. Pt also reports history of 3 falls in the past month. On PT evaluation, pt presents with generalized weakness, left first toe pain, decreased endurance, and balance impairments. Ambulating 50 feet with a walker at a min guard assist level. Pt wanting to go to SNF due to decreased caregiver ability to assist her; this is appropriate considering pt is at high risk for falls based on recurrent falling and decreased gait speed. Will continue to follow acutely to progress as tolerated.      Follow Up Recommendations SNF    Equipment Recommendations  Rolling walker with 5" wheels;3in1 (PT)    Recommendations for Other Services       Precautions / Restrictions Precautions Precautions: Fall Restrictions Weight Bearing Restrictions: No      Mobility  Bed Mobility Overal bed mobility: Modified Independent                Transfers Overall transfer level: Needs assistance Equipment used: Rolling walker (2 wheeled) Transfers: Sit to/from Stand Sit to Stand: Min guard            Ambulation/Gait Ambulation/Gait assistance: Min guard Gait Distance (Feet): 50 Feet Assistive device: Rolling walker (2 wheeled) Gait Pattern/deviations: Step-through  pattern;Decreased stride length;Trunk flexed;Antalgic;Decreased stance time - left Gait velocity: decreased   General Gait Details: Pt with antalgic gait pattern, increased trunk flexion, fatigues easily.  Stairs            Wheelchair Mobility    Modified Rankin (Stroke Patients Only)       Balance Overall balance assessment: Needs assistance   Sitting balance-Leahy Scale: Good     Standing balance support: Bilateral upper extremity supported Standing balance-Leahy Scale: Poor Standing balance comment: reliant on external support                             Pertinent Vitals/Pain Pain Assessment: Faces Faces Pain Scale: Hurts even more Pain Location: left first toe Pain Descriptors / Indicators: Discomfort;Grimacing;Guarding Pain Intervention(s): Limited activity within patient's tolerance;Monitored during session    Home Living Family/patient expects to be discharged to:: Private residence Living Arrangements: Spouse/significant other Available Help at Discharge: Available 24 hours/day;Family Type of Home: Apartment Home Access: Stairs to enter Entrance Stairs-Rails: Can reach both Entrance Stairs-Number of Steps: 13 (2 sets of 6) Home Layout: One level Home Equipment: Shower seat;Grab bars - tub/shower;Cane - quad Additional Comments: 14 steps to enter home    Prior Function Level of Independence: Needs assistance   Gait / Transfers Assistance Needed: ambulatory with quad cane, recent falls  ADL's / Homemaking Assistance Needed: requires assist with IADL's, increasing difficulty with ADL's, getting out of bed, off of toilet        Hand Dominance   Dominant Hand: Right  Extremity/Trunk Assessment   Upper Extremity Assessment Upper Extremity Assessment: Overall WFL for tasks assessed    Lower Extremity Assessment Lower Extremity Assessment: Generalized weakness       Communication   Communication: No difficulties  Cognition  Arousal/Alertness: Awake/alert Behavior During Therapy: WFL for tasks assessed/performed Overall Cognitive Status: Within Functional Limits for tasks assessed                                        General Comments      Exercises     Assessment/Plan    PT Assessment Patient needs continued PT services  PT Problem List Decreased strength;Decreased activity tolerance;Decreased balance;Decreased mobility;Pain       PT Treatment Interventions DME instruction;Gait training;Therapeutic activities;Functional mobility training;Stair training;Therapeutic exercise;Balance training;Patient/family education    PT Goals (Current goals can be found in the Care Plan section)  Acute Rehab PT Goals Patient Stated Goal: "go to rehab." PT Goal Formulation: With patient Time For Goal Achievement: 02/15/20 Potential to Achieve Goals: Good    Frequency Min 3X/week   Barriers to discharge        Co-evaluation               AM-PAC PT "6 Clicks" Mobility  Outcome Measure Help needed turning from your back to your side while in a flat bed without using bedrails?: None Help needed moving from lying on your back to sitting on the side of a flat bed without using bedrails?: None Help needed moving to and from a bed to a chair (including a wheelchair)?: A Little Help needed standing up from a chair using your arms (e.g., wheelchair or bedside chair)?: A Little Help needed to walk in hospital room?: A Little Help needed climbing 3-5 steps with a railing? : A Lot 6 Click Score: 19    End of Session Equipment Utilized During Treatment: Gait belt Activity Tolerance: Patient tolerated treatment well Patient left: in bed;with call bell/phone within reach Nurse Communication: Mobility status PT Visit Diagnosis: Unsteadiness on feet (R26.81);Pain;Difficulty in walking, not elsewhere classified (R26.2) Pain - Right/Left: Left Pain - part of body: (1st toe)    Time:  3614-4315 PT Time Calculation (min) (ACUTE ONLY): 24 min   Charges:   PT Evaluation $PT Eval Moderate Complexity: 1 Mod PT Treatments $Therapeutic Activity: 8-22 mins          Wyona Almas, PT, DPT Acute Rehabilitation Services Pager [phone] Office [phone]   Deno Etienne 02/01/2020, 5:22 PM

## 2020-02-02 ENCOUNTER — Encounter (HOSPITAL_COMMUNITY): Payer: Self-pay | Admitting: Internal Medicine

## 2020-02-02 DIAGNOSIS — E11621 Type 2 diabetes mellitus with foot ulcer: Secondary | ICD-10-CM

## 2020-02-02 LAB — HEMOGLOBIN AND HEMATOCRIT, BLOOD
HCT: 29.9 % — ABNORMAL LOW (ref 36.0–46.0)
Hemoglobin: 9.1 g/dL — ABNORMAL LOW (ref 12.0–15.0)

## 2020-02-02 LAB — CBC
HCT: 23.8 % — ABNORMAL LOW (ref 36.0–46.0)
Hemoglobin: 6.7 g/dL — CL (ref 12.0–15.0)
MCH: 24.5 pg — ABNORMAL LOW (ref 26.0–34.0)
MCHC: 28.2 g/dL — ABNORMAL LOW (ref 30.0–36.0)
MCV: 87.2 fL (ref 80.0–100.0)
Platelets: 358 10*3/uL (ref 150–400)
RBC: 2.73 MIL/uL — ABNORMAL LOW (ref 3.87–5.11)
RDW: 21.8 % — ABNORMAL HIGH (ref 11.5–15.5)
WBC: 6.9 10*3/uL (ref 4.0–10.5)
nRBC: 3.2 % — ABNORMAL HIGH (ref 0.0–0.2)

## 2020-02-02 LAB — BASIC METABOLIC PANEL
Anion gap: 14 (ref 5–15)
BUN: 52 mg/dL — ABNORMAL HIGH (ref 8–23)
CO2: 24 mmol/L (ref 22–32)
Calcium: 8 mg/dL — ABNORMAL LOW (ref 8.9–10.3)
Chloride: 103 mmol/L (ref 98–111)
Creatinine, Ser: 6.68 mg/dL — ABNORMAL HIGH (ref 0.44–1.00)
GFR calc Af Amer: 7 mL/min — ABNORMAL LOW (ref >60)
GFR calc non Af Amer: 6 mL/min — ABNORMAL LOW (ref >60)
Glucose, Bld: 138 mg/dL — ABNORMAL HIGH (ref 70–99)
Potassium: 4.2 mmol/L (ref 3.5–5.1)
Sodium: 141 mmol/L (ref 135–145)

## 2020-02-02 LAB — GLUCOSE, CAPILLARY
Glucose-Capillary: 109 mg/dL — ABNORMAL HIGH (ref 70–99)
Glucose-Capillary: 161 mg/dL — ABNORMAL HIGH (ref 70–99)
Glucose-Capillary: 213 mg/dL — ABNORMAL HIGH (ref 70–99)
Glucose-Capillary: 142 mg/dL — ABNORMAL HIGH (ref 70–99)

## 2020-02-02 LAB — PREPARE RBC (CROSSMATCH)

## 2020-02-02 MED ORDER — SODIUM CHLORIDE 0.9 % IV SOLN: INTRAVENOUS | Status: DC

## 2020-02-02 MED ORDER — NEPRO/CARBSTEADY PO LIQD
237.0000 mL | Freq: Two times a day (BID) | ORAL | Status: DC
  Administered 2020-02-02 – 2020-02-08 (×9): 237 mL via ORAL

## 2020-02-02 MED ORDER — SODIUM CHLORIDE 0.9% IV SOLUTION: Freq: Once | INTRAVENOUS | Status: DC

## 2020-02-02 MED ORDER — HEPARIN SODIUM (PORCINE) 1000 UNIT/ML IJ SOLN
INTRAMUSCULAR | Status: AC
  Administered 2020-02-02: 3000 [IU]
  Filled 2020-02-02: qty 3

## 2020-02-02 MED ORDER — CEFAZOLIN SODIUM-DEXTROSE 2-4 GM/100ML-% IV SOLN: 2.0000 g | INTRAVENOUS | Status: DC

## 2020-02-02 MED ORDER — HEPARIN SODIUM (PORCINE) 5000 UNIT/ML IJ SOLN
5000.0000 [IU] | Freq: Three times a day (TID) | INTRAMUSCULAR | Status: DC
  Administered 2020-02-02 – 2020-02-08 (×19): 5000 [IU] via SUBCUTANEOUS
  Filled 2020-02-02 (×19): qty 1

## 2020-02-02 MED ORDER — VANCOMYCIN HCL IN DEXTROSE 1-5 GM/200ML-% IV SOLN
INTRAVENOUS | Status: AC
  Administered 2020-02-02: 1000 mg via INTRAVENOUS
  Filled 2020-02-02: qty 200

## 2020-02-02 MED ORDER — RENA-VITE PO TABS
1.0000 | ORAL_TABLET | Freq: Every day | ORAL | Status: DC
  Administered 2020-02-02 – 2020-02-09 (×8): 1 via ORAL
  Filled 2020-02-02 (×8): qty 1

## 2020-02-02 NOTE — Progress Notes (Signed)
PROGRESS NOTE    [name] [name]  UEA:[id number] DOB: 12/28/1951 DOA: 01/31/2020 PCP: [facility]    Brief Narrative:  [name] [name] is a 69 y.o.femalewith medical history significant ofCADs/pCABG, combined systolic and diastolic CHF, PVD, ESRD on HD (TTS), DM type II, hyperparathyroidism, asthma, polysubstance abuse, tobacco abuse.She presents with complaints of chest pain over the last 3 days. Pain is sharp and stabbing in nature located underneath her left breast. Symptoms have waxed and waned in intensity, but nothing has helped to relieve the pain. The chest pain does not radiate. She has had some associated shortness of breath. Patient was last dialyzed on 1/28. Since that time she has not been able to ambulate due to weakness and complaints of severe pain in her left great toe. She reports that the nailfell off approximately 2 weeks ago. Initially she thought symptoms were secondary to a gout flare. Due to her pain in her foot she has been more sedentary and not getting around as much. Patient had just recently been hospitalized last month for pulmonary edema.   New events last 24 hours / Subjective: She says chest pain is resolved.  Denies having any major complaints at this time.   Hemodialysis today per nephrology.  Hemoglobin this morning 6.6.   Assessment & Plan:   Principal Problem:   Chest pain Active Problems:   Hyperlipidemia   Essential hypertension   Coronary artery disease   S/P CABG (coronary artery bypass graft)   Type 2 diabetes mellitus with chronic kidney disease on chronic dialysis, with long-term current use of insulin (HCC)   Obesity, Class III, BMI 40-49.9 (morbid obesity) (HCC)   ESRD (end stage renal disease) (HCC)   Depression   Chest pain, likely musculoskeletal in nature -Troponin 0.22 -Chest pain improved per patient.  Left great toe diabetic foot ulcer  -X-ray: Soft tissue swelling, with ulceration of the first  digit. No underlying radiographic evidence of osteomyelitis -CRP 2.3, sed rate 60  -Blood cultures pending  -ABI to rule out ischemia pending  -MRI  showing focal area of ulceration in the first digit distal tuft, no sinus tract or abscess.  Reactive marrow in the distal tuft of the first digit without definite evidence of osteomyelitis. -Empiric vancomycin, Rocephin, metronidazole  ESRD on hemodialysis -Nephrology following for dialysis  CAD, status post CABG 2013 -Continue aspirin, pravastatin  Hypertension -Continue Lopressor  Type 2 diabetes, insulin-dependent, well controlled -Hemoglobin A1c 5.0 in Jan 2021 -Sliding scale insulin  Hyperlipidemia -Continue pravastatin  Depression, anxiety -Continue Wellbutrin  GERD -Continue Protonix  Tobacco abuse -Cessation counseling, states that she currently smokes about 6 cigarettes daily, has been smoking since she was 69 yo   Anemia -Hemoglobin this morning 6.7.  Ordered FOBT. -We will repeat H&H this afternoon.  Continue to monitor closely.   DVT prophylaxis: Subcutaneous heparin  Code Status: Full code Family Communication: None at bedside  Disposition Plan: Patient is from home prior to admission. Currently in-hospital treatment needed due to work-up for left great toe wound. Barrier(s) to discharge include continued medical treatment and suspect patient will discharge to home versus SNF.  Patient states that her husband can no longer take care of her.  PT and TOC consulted.    Consultants:   Nephrology  Procedures:   None Antimicrobials:  Anti-infectives (From admission, onward)   Start     Dose/Rate Route Frequency Ordered Stop   02/02/20 1200  vancomycin (VANCOCIN) IVPB 1000 mg/200 mL premix  1,000 mg 200 mL/hr over 60 Minutes Intravenous Every T-Th-Sa (Hemodialysis) 01/31/20 1822     02/02/20 0730  ceFAZolin (ANCEF) IVPB 2g/100 mL premix  Status:  Discontinued     2 g 200 mL/hr over 30  Minutes Intravenous To Radiology 02/02/20 0728 02/02/20 0806   01/31/20 2030  cefTRIAXone (ROCEPHIN) 2 g in sodium chloride 0.9 % 100 mL IVPB     2 g 200 mL/hr over 30 Minutes Intravenous Every 24 hours 01/31/20 1749     01/31/20 2000  metroNIDAZOLE (FLAGYL) IVPB 500 mg     500 mg 100 mL/hr over 60 Minutes Intravenous Every 8 hours 01/31/20 1749     01/31/20 1930  vancomycin (VANCOREADY) IVPB 2000 mg/400 mL     2,000 mg 200 mL/hr over 120 Minutes Intravenous  Once 01/31/20 1822 02/01/20 0200         Objective: Vitals:   02/02/20 0728 02/02/20 0903 02/02/20 0912 02/02/20 1058  BP:  126/60 118/67 126/75  Pulse: 65 79 78 75  Resp: 16 18 19    Temp:  99 F (37.2 C) 98.8 F (37.1 C)   TempSrc:  Oral Oral   SpO2: 93% 100% 97%   Weight:   119.1 kg   Height:        Intake/Output Summary (Last 24 hours) at 02/02/2020 1111 Last data filed at 02/02/2020 0900 Gross per 24 hour  Intake 1580 ml  Output --  Net 1580 ml   Filed Weights   02/01/20 0420 02/01/20 2032 02/02/20 0912  Weight: 121 kg 123.9 kg 119.1 kg    Examination:  General exam: Appears calm and comfortable  Respiratory system: Decreased breath sound lower lobes, occasional wheezing, no rhonchi Cardiovascular system: S1 & S2 heard, RRR. trace pedal edema. Gastrointestinal system: Abdomen is nondistended, soft and nontender. Normal bowel sounds heard. Central nervous system: Oriented, nonfocal Extremities: Symmetric, left great toe with eschar at the tip. Skin: Left great toe with eschar at the tip  Psychiatry: Judgement and insight appear normal. Mood & affect appropriate.     Data Reviewed: I have personally reviewed following labs and imaging studies  CBC: Recent Labs  Lab 01/31/20 1056 02/01/20 0528 02/02/20 0400  WBC 7.9 8.0 6.9  NEUTROABS 5.7  --   --   HGB 7.8* 7.0* 6.7*  HCT 27.1* 25.0* 23.8*  MCV 86.9 87.7 87.2  PLT 461* 377 761   Basic Metabolic Panel: Recent Labs  Lab 01/31/20 1056  02/01/20 0528 02/02/20 0400  NA 140 141 141  K 4.1 4.7 4.2  CL 101 101 103  CO2 23 25 24   GLUCOSE 93 112* 138*  BUN 88* 40* 52*  CREATININE 8.57* 5.07* 6.68*  CALCIUM 8.0* 7.9* 8.0*  PHOS  --  4.4  --    GFR: Estimated Creatinine Clearance: 10.8 mL/min (A) (by C-G formula based on SCr of 6.68 mg/dL (H)). Liver Function Tests: Recent Labs  Lab 01/31/20 1056 02/01/20 0528  AST 12*  --   ALT 10  --   ALKPHOS 111  --   BILITOT 0.3  --   PROT 6.5  --   ALBUMIN 2.8* 2.5*   Recent Labs  Lab 01/31/20 1056  LIPASE 26   No results for input(s): AMMONIA in the last 168 hours. Coagulation Profile: No results for input(s): INR, PROTIME in the last 168 hours. Cardiac Enzymes: No results for input(s): CKTOTAL, CKMB, CKMBINDEX, TROPONINI in the last 168 hours. BNP (last 3 results) No results for input(s): PROBNP  in the last 8760 hours. HbA1C: No results for input(s): HGBA1C in the last 72 hours. CBG: Recent Labs  Lab 02/01/20 0650 02/01/20 1116 02/01/20 1616 02/01/20 2048 02/02/20 0654  GLUCAP 110* 165* 84 137* 109*   Lipid Profile: No results for input(s): CHOL, HDL, LDLCALC, TRIG, CHOLHDL, LDLDIRECT in the last 72 hours. Thyroid Function Tests: No results for input(s): TSH, T4TOTAL, FREET4, T3FREE, THYROIDAB in the last 72 hours. Anemia Panel: No results for input(s): VITAMINB12, FOLATE, FERRITIN, TIBC, IRON, RETICCTPCT in the last 72 hours. Sepsis Labs: Recent Labs  Lab 01/31/20 1116 01/31/20 1330  LATICACIDVEN 1.5 1.2    Recent Results (from the past 240 hour(s))  Blood culture (routine x 2)     Status: None (Preliminary result)   Collection Time: 01/31/20 11:16 AM   Specimen: BLOOD RIGHT HAND  Result Value Ref Range Status   Specimen Description BLOOD RIGHT HAND  Final   Special Requests   Final    BOTTLES DRAWN AEROBIC ONLY Blood Culture results may not be optimal due to an inadequate volume of blood received in culture bottles   Culture   Final    NO  GROWTH < 24 HOURS Performed at [facility], [place] [address], [place], [place] [address]    Report Status PENDING  Incomplete  Blood culture (routine x 2)     Status: None (Preliminary result)   Collection Time: 01/31/20 11:58 AM   Specimen: BLOOD  Result Value Ref Range Status   Specimen Description BLOOD RIGHT ANTECUBITAL  Final   Special Requests   Final    BOTTLES DRAWN AEROBIC AND ANAEROBIC Blood Culture results may not be optimal due to an inadequate volume of blood received in culture bottles   Culture   Final    NO GROWTH < 24 HOURS Performed at [facility], [place] [address] [place], [place] [address]    Report Status PENDING  Incomplete  Respiratory Panel by RT PCR (Flu A&B, Covid) - Nasopharyngeal Swab     Status: None   Collection Time: 01/31/20 12:01 PM   Specimen: Nasopharyngeal Swab  Result Value Ref Range Status   SARS Coronavirus 2 by RT PCR NEGATIVE NEGATIVE Final    Comment: (NOTE) SARS-CoV-2 target nucleic acids are NOT DETECTED. The SARS-CoV-2 RNA is generally detectable in upper respiratoy specimens during the acute phase of infection. The lowest concentration of SARS-CoV-2 viral copies this assay can detect is 131 copies/mL. A negative result does not preclude SARS-Cov-2 infection and should not be used as the sole basis for treatment or other patient management decisions. A negative result may occur with  improper specimen collection/handling, submission of specimen other than nasopharyngeal swab, presence of viral mutation(s) within the areas targeted by this assay, and inadequate number of viral copies (<131 copies/mL). A negative result must be combined with clinical observations, patient history, and epidemiological information. The expected result is Negative. Fact Sheet for Patients:  PinkCheek.be Fact Sheet for Healthcare Providers:  GravelBags.it This test is not yet ap proved or  cleared by the [place] FDA and  has been authorized for detection and/or diagnosis of SARS-CoV-2 by FDA under an Emergency Use Authorization (EUA). This EUA will remain  in effect (meaning this test can be used) for the duration of the COVID-19 declaration under Section 564(b)(1) of the Act, 21 U.S.C. section 360bbb-3(b)(1), unless the authorization is terminated or revoked sooner.    Influenza A by PCR NEGATIVE NEGATIVE Final   Influenza B  by PCR NEGATIVE NEGATIVE Final    Comment: (NOTE) The Xpert Xpress SARS-CoV-2/FLU/RSV assay is intended as an aid in  the diagnosis of influenza from Nasopharyngeal swab specimens and  should not be used as a sole basis for treatment. Nasal washings and  aspirates are unacceptable for Xpert Xpress SARS-CoV-2/FLU/RSV  testing. Fact Sheet for Patients: PinkCheek.be Fact Sheet for Healthcare Providers: GravelBags.it This test is not yet approved or cleared by the [place] FDA and  has been authorized for detection and/or diagnosis of SARS-CoV-2 by  FDA under an Emergency Use Authorization (EUA). This EUA will remain  in effect (meaning this test can be used) for the duration of the  Covid-19 declaration under Section 564(b)(1) of the Act, 21  U.S.C. section 360bbb-3(b)(1), unless the authorization is  terminated or revoked. Performed at [facility], [place] [address], [place], [place] [address]   MRSA PCR Screening     Status: None   Collection Time: 02/01/20 12:00 AM   Specimen: Nasopharyngeal  Result Value Ref Range Status   MRSA by PCR NEGATIVE NEGATIVE Final    Comment:        The GeneXpert MRSA Assay (FDA approved for NASAL specimens only), is one component of a comprehensive MRSA colonization surveillance program. It is not intended to diagnose MRSA infection nor to guide or monitor treatment for MRSA infections. Performed at [facility], [place] [address] [place], [place] [address]          Radiology Studies: MR FOOT LEFT WO CONTRAST  Result Date: 02/01/2020 CLINICAL DATA:  Non pressure chronic ulcer of the great toe, nail has fallen off EXAM: MRI OF THE LEFT FOOT WITHOUT CONTRAST TECHNIQUE: Multiplanar, multisequence MR imaging of the left was performed. No intravenous contrast was administered. COMPARISON:  None. FINDINGS: Bones/Joint/Cartilage There is a large area of ulceration seen overlying the dorsal surface of the distal first toe with no overlying nail. No soft tissue fluid collection is seen within the nail bed. There is diffuse overlying soft tissue swelling and skin thickening however. There is increased T2 hyperintense signal seen within the distal tuft of the first digit without T1 hypointensity. No acute fracture or areas of cortical destruction. No joint effusions are seen. Ligaments The Lisfranc ligaments are intact. Muscles and Tendons Diffusely increased signal seen throughout the muscles with diffuse fatty atrophy. The flexor extensor tendons appear to be intact. The plantar fascia is intact. Soft tissues Focal area of ulceration overlying the distal first digit as described above. There is dorsal subcutaneous edema. IMPRESSION: Focal area of ulceration at the first digit distal tuft with no nail. No sinus tract or soft tissue abscess. There is reactive marrow in the distal tuft of the first digit without definite evidence of osteomyelitis. 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 Dorsal subcutaneous edema. Electronically Signed   By: Prudencio Pair M.D.   On: 02/01/2020 23:54   NM PULMONARY VENT AND PERF (V/Q Scan)  Result Date: 01/31/2020 CLINICAL DATA:  Nonradiating chest pain for 3 days. EXAM: NUCLEAR MEDICINE VENTILATION - PERFUSION LUNG SCAN TECHNIQUE: Ventilation images were obtained in multiple projections using inhaled aerosol Tc-45m DTPA. Perfusion images were obtained in multiple projections after  intravenous injection of Tc-67m MAA. RADIOPHARMACEUTICALS:  41.2 mCi of Tc-58m DTPA aerosol inhalation and 1.5 mCi Tc66m MAA IV COMPARISON:  April 28, 2019 FINDINGS: Ventilation: No focal ventilation defect. Perfusion: No wedge shaped peripheral perfusion defects to suggest acute pulmonary embolism. IMPRESSION: 1. Normal nuclear  medicine ventilation/perfusion scan. Electronically Signed   By: Virgina Norfolk M.D.   On: 01/31/2020 20:34   DG Chest Portable 1 View  Result Date: 01/31/2020 CLINICAL DATA:  End-stage renal disease, chest pain and left lower extremity swelling EXAM: PORTABLE CHEST 1 VIEW COMPARISON:  01/01/2020 FINDINGS: Single frontal view of the chest demonstrates a right internal jugular dialysis catheter tip overlying superior vena cava. Postsurgical changes from median sternotomy. Cardiac silhouette is enlarged but stable. Mild central vascular congestion is chronic, with no airspace disease, effusion, or pneumothorax. IMPRESSION: 1. Stable exam, no acute process. Electronically Signed   By: Randa Ngo M.D.   On: 01/31/2020 11:41   DG Foot Complete Left  Result Date: 01/31/2020 CLINICAL DATA:  Left foot swelling, left great toe pain and skin abnormality EXAM: LEFT FOOT - COMPLETE 3+ VIEW COMPARISON:  None. FINDINGS: Frontal, oblique, lateral views of the left foot are obtained. The bones are diffusely osteopenic. Prominent enthesopathic changes of the calcaneus. Joint spaces are relatively well preserved. There is diffuse soft tissue swelling of the forefoot. Evidence of soft tissue ulceration medial aspect first digit. I do not see any underlying bony destruction or periosteal reaction to suggest osteomyelitis. IMPRESSION: 1. Soft tissue swelling, with ulceration of the first digit. No underlying radiographic evidence of osteomyelitis. If osteomyelitis remains a clinical concern, three-phase bone scan or MRI could be considered. Electronically Signed   By: Randa Ngo M.D.   On:  01/31/2020 11:41   VAS [place] ABI WITH/WO TBI  Result Date: 02/01/2020 LOWER EXTREMITY DOPPLER STUDY Indications: Gangrene.  Limitations: Today's exam was limited due to patient positioning, patient              intolerant to cuff pressure, an open wound and poor patient              cooperation, patient pain tolerance, patient constant movement. Comparison Study: No prior studies. Performing Technologist: Carlos Levering Rvt  Examination Guidelines: A complete evaluation includes at minimum, Doppler waveform signals and systolic blood pressure reading at the level of bilateral brachial, anterior tibial, and posterior tibial arteries, when vessel segments are accessible. Bilateral testing is considered an integral part of a complete examination. Photoelectric Plethysmograph (PPG) waveforms and toe systolic pressure readings are included as required and additional duplex testing as needed. Limited examinations for reoccurring indications may be performed as noted.  ABI Findings: +--------+------------------+-----+----------+--------+ Right   Rt Pressure (mmHg)IndexWaveform  Comment  +--------+------------------+-----+----------+--------+ [id number]                    triphasic          +--------+------------------+-----+----------+--------+ PTA     86                0.72 monophasic         +--------+------------------+-----+----------+--------+ DP      122               1.03 monophasic         +--------+------------------+-----+----------+--------+ +--------+------------------+-----+--------+--------------+ Left    Lt Pressure (mmHg)IndexWaveformComment        +--------+------------------+-----+--------+--------------+ Brachial                               Restricted arm +--------+------------------+-----+--------+--------------+  Summary: Right: Values are likely unreliable due to constant patient movement and poor cooperation. Left: Patient refused further testing due to pain  tolerance.  *See table(s) above for measurements and observations.  Electronically signed by Curt Jews MD on 02/01/2020 at 5:07:48 PM.   Final         Scheduled Meds: . sodium chloride   Intravenous Once  . allopurinol  100 mg Oral BID  . aspirin EC  325 mg Oral Daily  . brinzolamide  1 drop Both Eyes BID  . buPROPion  150 mg Oral BID  . Chlorhexidine Gluconate Cloth  6 each Topical Q0600  . feeding supplement (NEPRO CARB STEADY)  237 mL Oral BID BM  . ferrous sulfate  325 mg Oral BID WC  . gabapentin  300 mg Oral Daily  . heparin  7,500 Units Subcutaneous Q8H  . insulin aspart  0-6 Units Subcutaneous TID WC  . latanoprost  1 drop Both Eyes QHS  . linaclotide  72 mcg Oral QAC breakfast  . metoprolol tartrate  25 mg Oral BID  . mometasone-formoterol  2 puff Inhalation BID  . multivitamin  1 tablet Oral QHS  . pantoprazole  40 mg Oral Daily  . pravastatin  40 mg Oral Daily  . sodium bicarbonate  650 mg Oral TID  . sodium chloride flush  3 mL Intravenous Q12H  . sucroferric oxyhydroxide  500 mg Oral TID WC   Continuous Infusions: . sodium chloride    . cefTRIAXone (ROCEPHIN)  IV Stopped (02/01/20 2157)  . ferric gluconate (FERRLECIT/NULECIT) IV Stopped (01/31/20 1801)  . metronidazole Stopped (02/02/20 0618)  . vancomycin       LOS: 2 days    Time spent: 35 min    Yaakov Guthrie, MD [facility]   To contact the attending provider between 7A-7P or the covering provider during after hours 7P-7A, please log into the web site [email or web address] and access using universal [place] password for that web site. If you do not have the password, please call the hospital operator.  02/02/2020, 11:11 AM

## 2020-02-02 NOTE — Plan of Care (Signed)
  Problem: Education: Goal: Knowledge of General Education information will improve Description: Including pain rating scale, medication(s)/side effects and non-pharmacologic comfort measures Outcome: Progressing   Problem: Clinical Measurements: Goal: Respiratory complications will improve Outcome: Progressing   

## 2020-02-02 NOTE — Consult Note (Signed)
Acknowledging PV consult. Chart has been reviewed. Unable to assess patient at this time due to in MRI.  Will follow up at a later time.  Cletis Media RN BSN CWS [place] [phone]

## 2020-02-02 NOTE — Progress Notes (Signed)
Initial Nutrition Assessment  DOCUMENTATION CODES:   Obesity unspecified  INTERVENTION:    Nepro Shake po BID, each supplement provides 425 kcal and 19 grams protein  Renal MVI daily   NUTRITION DIAGNOSIS:   Increased nutrient needs related to wound healing as evidenced by estimated needs.  GOAL:   Patient will meet greater than or equal to 90% of their needs   MONITOR:   PO intake, Supplement acceptance, Weight trends, Labs, I & O's, Skin  REASON FOR ASSESSMENT:   Malnutrition Screening Tool    ASSESSMENT:   Patient with PMH significant for CAD s/p CABG, CHF, PVD, ESRD on HD, DM, hyperparathyroidism, asthma, and polysubstance abuse. Presents this admission with L toe diabetic ulcer and chest pain.    RD working remotely.  Of note: pt recently missed last two HD outpatient HD treatments.   Pt denies loss of appetite PTA. States she consumes three meals daily on HD and non HD days. She has to wait a little longer after HD to have lunch because she is often too tired to eat right after. Meals consist of B- french toast or cereal with toast L- baked chicken, potatoes, green beans D- pork loin and mashed potatoes. Does not use supplementation. Discussed the importance of protein intake for preservation of lean body mass. Pt willing to try Nepro. Meal completions charted as 50-100% since admit. Pt states she is very picky when other people prepare her meals.   Pt continues to make urine 3-4 times daily (small quantity). Does not remember if she takes binders but reports phosphorus has ran low at outpatient clinic.   EDW: 118 kg  Current weight: 123.9 kg   Pt reports EDW started at 122 kg when she started HD in September and has gradually decreased to 118 kg (insignficant loss for time frame).   I/O: -680 ml since admit Last HD 2/2: 3000 ml net UF  Medications: ferrous sulfate, SS novolog, sodium bicarb, velphoro Labs: CBG 84-137  Diet Order:   Diet Order           Diet renal/carb modified with fluid restriction Diet-HS Snack? Nothing; Fluid restriction: 1200 mL Fluid; Room service appropriate? Yes; Fluid consistency: Thin  Diet effective now              EDUCATION NEEDS:   Education needs have been addressed  Skin:  Skin Assessment: Skin Integrity Issues: Skin Integrity Issues:: Other (Comment), Diabetic Ulcer Diabetic Ulcer: L great toe Other: MASD- breast, abdomen  Last BM:  2/3  Height:   Ht Readings from Last 1 Encounters:  01/31/20 5\' 8"  (1.727 m)    Weight:   Wt Readings from Last 1 Encounters:  02/01/20 123.9 kg    Ideal Body Weight:  70 kg  BMI:  Body mass index is 41.53 kg/m.  Estimated Nutritional Needs:   Kcal:  2100-2300 kcal  Protein:  110-125 grams  Fluid:  1000 ml + UOP   Mariana Single RD, LDN Clinical Nutrition Pager listed in [place]

## 2020-02-02 NOTE — Plan of Care (Signed)
  Problem: Education: Goal: Knowledge of General Education information will improve Description Including pain rating scale, medication(s)/side effects and non-pharmacologic comfort measures Outcome: Progressing   

## 2020-02-02 NOTE — Progress Notes (Signed)
CRITICAL VALUE ALERT  Critical Value:  Hbg 6.7 g/dL  Date & Time Notied: 02/02/20 & 6282  Provider Notified: NP Ardith Dark  Orders Received/Actions taken:  Yes, to transfuse 2 units

## 2020-02-02 NOTE — Progress Notes (Signed)
Subjective:  Toe feels better / for hd today   Objective Vital signs in last 24 hours: Vitals:   02/01/20 2059 02/02/20 0530 02/02/20 0728 02/02/20 0903  BP:  (!) 122/49  126/60  Pulse:  76 65 79  Resp:  14 16 18   Temp:  100 F (37.8 C)  99 F (37.2 C)  TempSrc:  Oral  Oral  SpO2: 98% 99% 93% 100%  Weight:      Height:       Weight change: 2.79 kg  Physical Exam: General: Alert obese Female NAD  OX3 Heart: RRR  Lungs:some faint  wheezing , unlabored breathing  Abdomen: Obese, soft, NT, ND Extremities: Trace pedal edema , Left grt Toe  Tip  Ulcer  Dry Eschar  Dialysis Access: perm cath   Dialysis Orders:[facility] T,Th,S 4 hours 15 min 180NRe 400/800 118kg 2.0 K/ 2.0 Ca TDC/AVF -No heparin -Mircera 225 mcg IV q 2 weeks  ( LAST  ON 01/26/20) -Venofer 100 mg IVqtx through 2/11 -Calcitriol 1.61mcg PO TIW   Problem/Plan: 1. Acute Hypoxic Respiratory failure/Volume Overload: HD yesterday ev eng with 3  UF  tolerated. next  Hd today   Needs stand wt  2. Chest pain: per primary, recent similar presentation.-resolves this am  3. ESRD - T,Th,S via TDC. K+ 4.7, No heparin. 4. Hypertension/volume -bp stable after hd uf  Last pm  UF to EDW as tolerated 5. Anemia - HGB7.8.>7.0>6.7  this am  / FOR prbcS ON HD Recent max 225 MCG  ESA dose due  NEXT ON 2/11). Continue Fe load.  6. Metabolic bone disease - Continue binders,Calcitriol 1.75 TIW. Phos 4.4  Ca  Ok  7. Nutrition - carb mod /Renal diet when able to eat. 8. Left grt toe  Diabetic Ulcer = wu per admit . On empiric Vanco, Rocephin, Metronidazole  9.  Tobacco ABUSE - ongoing problem ,dw need to stop . Currently no plan 10. Debilitation = she requests NHP/  Admit rx  [name], PA-C [facility] [phone] 02/02/2020,10:11 AM  LOS: 2 days   Labs: Basic Metabolic Panel: Recent Labs  Lab 01/31/20 1056 02/01/20 0528 02/02/20 0400  NA 140 141 141  K 4.1 4.7 4.2  CL 101 101 103  CO2  23 25 24   GLUCOSE 93 112* 138*  BUN 88* 40* 52*  CREATININE 8.57* 5.07* 6.68*  CALCIUM 8.0* 7.9* 8.0*  PHOS  --  4.4  --    Liver Function Tests: Recent Labs  Lab 01/31/20 1056 02/01/20 0528  AST 12*  --   ALT 10  --   ALKPHOS 111  --   BILITOT 0.3  --   PROT 6.5  --   ALBUMIN 2.8* 2.5*   Recent Labs  Lab 01/31/20 1056  LIPASE 26   No results for input(s): AMMONIA in the last 168 hours. CBC: Recent Labs  Lab 01/31/20 1056 02/01/20 0528 02/02/20 0400  WBC 7.9 8.0 6.9  NEUTROABS 5.7  --   --   HGB 7.8* 7.0* 6.7*  HCT 27.1* 25.0* 23.8*  MCV 86.9 87.7 87.2  PLT 461* 377 358   Cardiac Enzymes: No results for input(s): CKTOTAL, CKMB, CKMBINDEX, TROPONINI in the last 168 hours. CBG: Recent Labs  Lab 02/01/20 0650 02/01/20 1116 02/01/20 1616 02/01/20 2048 02/02/20 0654  GLUCAP 110* 165* 84 137* 109*    Studies/Results: MR FOOT LEFT WO CONTRAST  Result Date: 02/01/2020 CLINICAL DATA:  Non pressure chronic ulcer of the great  toe, nail has fallen off EXAM: MRI OF THE LEFT FOOT WITHOUT CONTRAST TECHNIQUE: Multiplanar, multisequence MR imaging of the left was performed. No intravenous contrast was administered. COMPARISON:  None. FINDINGS: Bones/Joint/Cartilage There is a large area of ulceration seen overlying the dorsal surface of the distal first toe with no overlying nail. No soft tissue fluid collection is seen within the nail bed. There is diffuse overlying soft tissue swelling and skin thickening however. There is increased T2 hyperintense signal seen within the distal tuft of the first digit without T1 hypointensity. No acute fracture or areas of cortical destruction. No joint effusions are seen. Ligaments The Lisfranc ligaments are intact. Muscles and Tendons Diffusely increased signal seen throughout the muscles with diffuse fatty atrophy. The flexor extensor tendons appear to be intact. The plantar fascia is intact. Soft tissues Focal area of ulceration overlying  the distal first digit as described above. There is dorsal subcutaneous edema. IMPRESSION: Focal area of ulceration at the first digit distal tuft with no nail. No sinus tract or soft tissue abscess. There is reactive marrow in the distal tuft of the first digit without definite evidence of osteomyelitis. 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 Dorsal subcutaneous edema. Electronically Signed   By: Prudencio Pair M.D.   On: 02/01/2020 23:54   NM PULMONARY VENT AND PERF (V/Q Scan)  Result Date: 01/31/2020 CLINICAL DATA:  Nonradiating chest pain for 3 days. EXAM: NUCLEAR MEDICINE VENTILATION - PERFUSION LUNG SCAN TECHNIQUE: Ventilation images were obtained in multiple projections using inhaled aerosol Tc-34m DTPA. Perfusion images were obtained in multiple projections after intravenous injection of Tc-31m MAA. RADIOPHARMACEUTICALS:  41.2 mCi of Tc-82m DTPA aerosol inhalation and 1.5 mCi Tc40m MAA IV COMPARISON:  April 28, 2019 FINDINGS: Ventilation: No focal ventilation defect. Perfusion: No wedge shaped peripheral perfusion defects to suggest acute pulmonary embolism. IMPRESSION: 1. Normal nuclear medicine ventilation/perfusion scan. Electronically Signed   By: Virgina Norfolk M.D.   On: 01/31/2020 20:34   DG Chest Portable 1 View  Result Date: 01/31/2020 CLINICAL DATA:  End-stage renal disease, chest pain and left lower extremity swelling EXAM: PORTABLE CHEST 1 VIEW COMPARISON:  01/01/2020 FINDINGS: Single frontal view of the chest demonstrates a right internal jugular dialysis catheter tip overlying superior vena cava. Postsurgical changes from median sternotomy. Cardiac silhouette is enlarged but stable. Mild central vascular congestion is chronic, with no airspace disease, effusion, or pneumothorax. IMPRESSION: 1. Stable exam, no acute process. Electronically Signed   By: Randa Ngo M.D.   On: 01/31/2020 11:41   DG Foot Complete Left  Result Date: 01/31/2020 CLINICAL DATA:  Left  foot swelling, left great toe pain and skin abnormality EXAM: LEFT FOOT - COMPLETE 3+ VIEW COMPARISON:  None. FINDINGS: Frontal, oblique, lateral views of the left foot are obtained. The bones are diffusely osteopenic. Prominent enthesopathic changes of the calcaneus. Joint spaces are relatively well preserved. There is diffuse soft tissue swelling of the forefoot. Evidence of soft tissue ulceration medial aspect first digit. I do not see any underlying bony destruction or periosteal reaction to suggest osteomyelitis. IMPRESSION: 1. Soft tissue swelling, with ulceration of the first digit. No underlying radiographic evidence of osteomyelitis. If osteomyelitis remains a clinical concern, three-phase bone scan or MRI could be considered. Electronically Signed   By: Randa Ngo M.D.   On: 01/31/2020 11:41   VAS [place] ABI WITH/WO TBI  Result Date: 02/01/2020 LOWER EXTREMITY DOPPLER STUDY Indications: Gangrene.  Limitations: Today's exam was limited due to patient positioning, patient  intolerant to cuff pressure, an open wound and poor patient              cooperation, patient pain tolerance, patient constant movement. Comparison Study: No prior studies. Performing Technologist: Carlos Levering Rvt  Examination Guidelines: A complete evaluation includes at minimum, Doppler waveform signals and systolic blood pressure reading at the level of bilateral brachial, anterior tibial, and posterior tibial arteries, when vessel segments are accessible. Bilateral testing is considered an integral part of a complete examination. Photoelectric Plethysmograph (PPG) waveforms and toe systolic pressure readings are included as required and additional duplex testing as needed. Limited examinations for reoccurring indications may be performed as noted.  ABI Findings: +--------+------------------+-----+----------+--------+ Right   Rt Pressure (mmHg)IndexWaveform  Comment   +--------+------------------+-----+----------+--------+ [id number]                    triphasic          +--------+------------------+-----+----------+--------+ PTA     86                0.72 monophasic         +--------+------------------+-----+----------+--------+ DP      122               1.03 monophasic         +--------+------------------+-----+----------+--------+ +--------+------------------+-----+--------+--------------+ Left    Lt Pressure (mmHg)IndexWaveformComment        +--------+------------------+-----+--------+--------------+ Brachial                               Restricted arm +--------+------------------+-----+--------+--------------+  Summary: Right: Values are likely unreliable due to constant patient movement and poor cooperation. Left: Patient refused further testing due to pain tolerance.  *See table(s) above for measurements and observations.  Electronically signed by Curt Jews MD on 02/01/2020 at 5:07:48 PM.   Final    Medications: . sodium chloride    . cefTRIAXone (ROCEPHIN)  IV Stopped (02/01/20 2157)  . ferric gluconate (FERRLECIT/NULECIT) IV Stopped (01/31/20 1801)  . metronidazole Stopped (02/02/20 0618)  . vancomycin     . sodium chloride   Intravenous Once  . allopurinol  100 mg Oral BID  . aspirin EC  325 mg Oral Daily  . brinzolamide  1 drop Both Eyes BID  . buPROPion  150 mg Oral BID  . Chlorhexidine Gluconate Cloth  6 each Topical Q0600  . ferrous sulfate  325 mg Oral BID WC  . gabapentin  300 mg Oral Daily  . heparin  7,500 Units Subcutaneous Q8H  . insulin aspart  0-6 Units Subcutaneous TID WC  . latanoprost  1 drop Both Eyes QHS  . linaclotide  72 mcg Oral QAC breakfast  . metoprolol tartrate  25 mg Oral BID  . mometasone-formoterol  2 puff Inhalation BID  . pantoprazole  40 mg Oral Daily  . pravastatin  40 mg Oral Daily  . sodium bicarbonate  650 mg Oral TID  . sodium chloride flush  3 mL Intravenous Q12H  . sucroferric  oxyhydroxide  500 mg Oral TID WC

## 2020-02-03 ENCOUNTER — Ambulatory Visit: Payer: Medicare (Managed Care)

## 2020-02-03 DIAGNOSIS — I96 Gangrene, not elsewhere classified: Secondary | ICD-10-CM

## 2020-02-03 LAB — BASIC METABOLIC PANEL
Anion gap: 14 (ref 5–15)
BUN: 37 mg/dL — ABNORMAL HIGH (ref 8–23)
CO2: 26 mmol/L (ref 22–32)
Calcium: 8.4 mg/dL — ABNORMAL LOW (ref 8.9–10.3)
Chloride: 100 mmol/L (ref 98–111)
Creatinine, Ser: 5.09 mg/dL — ABNORMAL HIGH (ref 0.44–1.00)
GFR calc Af Amer: 9 mL/min — ABNORMAL LOW (ref >60)
GFR calc non Af Amer: 8 mL/min — ABNORMAL LOW (ref >60)
Glucose, Bld: 115 mg/dL — ABNORMAL HIGH (ref 70–99)
Potassium: 3.9 mmol/L (ref 3.5–5.1)
Sodium: 140 mmol/L (ref 135–145)

## 2020-02-03 LAB — CBC
HCT: 30.7 % — ABNORMAL LOW (ref 36.0–46.0)
Hemoglobin: 9.2 g/dL — ABNORMAL LOW (ref 12.0–15.0)
MCH: 25.5 pg — ABNORMAL LOW (ref 26.0–34.0)
MCHC: 30 g/dL (ref 30.0–36.0)
MCV: 85 fL (ref 80.0–100.0)
Platelets: 353 10*3/uL (ref 150–400)
RBC: 3.61 MIL/uL — ABNORMAL LOW (ref 3.87–5.11)
RDW: 20.4 % — ABNORMAL HIGH (ref 11.5–15.5)
WBC: 9.2 10*3/uL (ref 4.0–10.5)
nRBC: 1.6 % — ABNORMAL HIGH (ref 0.0–0.2)

## 2020-02-03 LAB — TYPE AND SCREEN
ABO/RH(D): O POS
Antibody Screen: NEGATIVE
Unit division: 0
Unit division: 0

## 2020-02-03 LAB — GLUCOSE, CAPILLARY
Glucose-Capillary: 107 mg/dL — ABNORMAL HIGH (ref 70–99)
Glucose-Capillary: 115 mg/dL — ABNORMAL HIGH (ref 70–99)
Glucose-Capillary: 129 mg/dL — ABNORMAL HIGH (ref 70–99)
Glucose-Capillary: 134 mg/dL — ABNORMAL HIGH (ref 70–99)

## 2020-02-03 LAB — BPAM RBC
Blood Product Expiration Date: 202102252359
Blood Product Expiration Date: 202102252359
ISSUE DATE / TIME: 202102041046
ISSUE DATE / TIME: 202102041046
Unit Type and Rh: 5100
Unit Type and Rh: 5100

## 2020-02-03 MED ORDER — CHLORHEXIDINE GLUCONATE CLOTH 2 % EX PADS
6.0000 | MEDICATED_PAD | Freq: Every day | CUTANEOUS | Status: DC
  Administered 2020-02-04 – 2020-02-08 (×5): 6 via TOPICAL

## 2020-02-03 MED ORDER — GABAPENTIN 100 MG PO CAPS
200.0000 mg | ORAL_CAPSULE | Freq: Two times a day (BID) | ORAL | Status: DC
  Administered 2020-02-03 – 2020-02-04 (×3): 200 mg via ORAL
  Filled 2020-02-03 (×4): qty 2

## 2020-02-03 NOTE — Progress Notes (Signed)
PROGRESS NOTE    [name] [name]  OBS:[id number] DOB: 01-25-1951 DOA: 01/31/2020 PCP: [facility]    Brief Narrative:  [name] [name] is a 69 y.o.femalewith medical history significant ofCADs/pCABG, combined systolic and diastolic CHF, PVD, ESRD on HD (TTS), DM type II, hyperparathyroidism, asthma, polysubstance abuse, tobacco abuse.She presents with complaints of chest pain over the last 3 days. Pain is sharp and stabbing in nature located underneath her left breast. Symptoms have waxed and waned in intensity, but nothing has helped to relieve the pain. The chest pain does not radiate. She has had some associated shortness of breath. Patient was last dialyzed on 1/28. Since that time she has not been able to ambulate due to weakness and complaints of severe pain in her left great toe. She reports that the nailfell off approximately 2 weeks ago. Initially she thought symptoms were secondary to a gout flare. Due to her pain in her foot she has been more sedentary and not getting around as much. Patient had just recently been hospitalized last month for pulmonary edema.   New events last 24 hours / Subjective: Has some toe pain, no nausea or vomiting.   Assessment & Plan:   Principal Problem:   Chest pain Active Problems:   Hyperlipidemia   Essential hypertension   Coronary artery disease   S/P CABG (coronary artery bypass graft)   Type 2 diabetes mellitus with chronic kidney disease on chronic dialysis, with long-term current use of insulin (HCC)   Obesity, Class III, BMI 40-49.9 (morbid obesity) (HCC)   ESRD (end stage renal disease) (HCC)   Depression   Chest pain, likely musculoskeletal in nature -Troponin 0.22 -Chest pain improved per patient. No further work up.  Left great toe diabetic foot ulcer  -X-ray: Soft tissue swelling, with ulceration of the first digit. No underlying radiographic evidence of osteomyelitis -CRP 2.3, sed rate 60  -Blood  cultures negative  -ABI to rule out ischemia, unable to complete the test. Vascular surgery consulted.  -MRI  showing focal area of ulceration in the first digit distal tuft, no sinus tract or abscess.  Reactive marrow in the distal tuft of the first digit without definite evidence of osteomyelitis. -Empiric vancomycin, Rocephin, metronidazole  ESRD on hemodialysis -Nephrology following for dialysis  CAD, status post CABG 2013 -Continue aspirin, pravastatin  Hypertension -Continue Lopressor  Type 2 diabetes, insulin-dependent, well controlled -Hemoglobin A1c 5.0 in Jan 2021 -Sliding scale insulin  Hyperlipidemia -Continue pravastatin  Depression, anxiety -Continue Wellbutrin  GERD -Continue Protonix  Tobacco abuse -Cessation counseling, states that she currently smokes about 6 cigarettes daily, has been smoking since she was 69 yo   Anemia -Hemoglobin this morning 6.7.  Ordered FOBT.  DVT prophylaxis: Subcutaneous heparin  Code Status: Full code Family Communication: None at bedside  Disposition Plan: Patient is from home prior to admission. Currently in-hospital treatment needed due to work-up for left great toe wound. Barrier(s) to discharge include continued medical treatment and suspect patient will discharge to home versus SNF.  Patient states that her husband can no longer take care of her.  PT and TOC consulted.    Consultants:   Nephrology  Procedures:   None Antimicrobials:  Anti-infectives (From admission, onward)   Start     Dose/Rate Route Frequency Ordered Stop   02/02/20 1200  vancomycin (VANCOCIN) IVPB 1000 mg/200 mL premix  Status:  Discontinued     1,000 mg 200 mL/hr over 60 Minutes Intravenous Every T-Th-Sa (Hemodialysis)  01/31/20 1822 02/03/20 1052   02/02/20 0730  ceFAZolin (ANCEF) IVPB 2g/100 mL premix  Status:  Discontinued     2 g 200 mL/hr over 30 Minutes Intravenous To Radiology 02/02/20 0728 02/02/20 0806   01/31/20 2030   cefTRIAXone (ROCEPHIN) 2 g in sodium chloride 0.9 % 100 mL IVPB     2 g 200 mL/hr over 30 Minutes Intravenous Every 24 hours 01/31/20 1749     01/31/20 2000  metroNIDAZOLE (FLAGYL) IVPB 500 mg  Status:  Discontinued     500 mg 100 mL/hr over 60 Minutes Intravenous Every 8 hours 01/31/20 1749 02/03/20 1052   01/31/20 1930  vancomycin (VANCOREADY) IVPB 2000 mg/400 mL     2,000 mg 200 mL/hr over 120 Minutes Intravenous  Once 01/31/20 1822 02/01/20 0200         Objective: Vitals:   02/03/20 0500 02/03/20 0732 02/03/20 0954 02/03/20 1757  BP:   (!) 145/56 91/75  Pulse:   72 76  Resp:   18 18  Temp:   98.7 F (37.1 C) 98.7 F (37.1 C)  TempSrc:   Oral   SpO2:  98% 98% 97%  Weight: 119.2 kg     Height:        Intake/Output Summary (Last 24 hours) at 02/03/2020 2035 Last data filed at 02/03/2020 1700 Gross per 24 hour  Intake 1633.04 ml  Output 0 ml  Net 1633.04 ml   Filed Weights   02/02/20 1358 02/02/20 2105 02/03/20 0500  Weight: 117.6 kg 119.2 kg 119.2 kg    Examination:  General exam: Appears calm and comfortable  Respiratory system: Decreased breath sound lower lobes, occasional wheezing, no rhonchi Cardiovascular system: S1 & S2 heard, RRR. trace pedal edema. Gastrointestinal system: Abdomen is nondistended, soft and nontender. Normal bowel sounds heard. Central nervous system: Oriented, nonfocal Extremities: Symmetric, left great toe with eschar at the tip. Skin: Left great toe with eschar at the tip  Psychiatry: Judgement and insight appear normal. Mood & affect appropriate.     Data Reviewed: I have personally reviewed following labs and imaging studies  CBC: Recent Labs  Lab 01/31/20 1056 02/01/20 0528 02/02/20 0400 02/02/20 1524 02/03/20 0415  WBC 7.9 8.0 6.9  --  9.2  NEUTROABS 5.7  --   --   --   --   HGB 7.8* 7.0* 6.7* 9.1* 9.2*  HCT 27.1* 25.0* 23.8* 29.9* 30.7*  MCV 86.9 87.7 87.2  --  85.0  PLT 461* 377 358  --  921   Basic Metabolic  Panel: Recent Labs  Lab 01/31/20 1056 02/01/20 0528 02/02/20 0400 02/03/20 0415  NA 140 141 141 140  K 4.1 4.7 4.2 3.9  CL 101 101 103 100  CO2 23 25 24 26   GLUCOSE 93 112* 138* 115*  BUN 88* 40* 52* 37*  CREATININE 8.57* 5.07* 6.68* 5.09*  CALCIUM 8.0* 7.9* 8.0* 8.4*  PHOS  --  4.4  --   --    GFR: Estimated Creatinine Clearance: 14.2 mL/min (A) (by C-G formula based on SCr of 5.09 mg/dL (H)). Liver Function Tests: Recent Labs  Lab 01/31/20 1056 02/01/20 0528  AST 12*  --   ALT 10  --   ALKPHOS 111  --   BILITOT 0.3  --   PROT 6.5  --   ALBUMIN 2.8* 2.5*   Recent Labs  Lab 01/31/20 1056  LIPASE 26   No results for input(s): AMMONIA in the last 168 hours. Coagulation Profile: No  results for input(s): INR, PROTIME in the last 168 hours. Cardiac Enzymes: No results for input(s): CKTOTAL, CKMB, CKMBINDEX, TROPONINI in the last 168 hours. BNP (last 3 results) No results for input(s): PROBNP in the last 8760 hours. HbA1C: No results for input(s): HGBA1C in the last 72 hours. CBG: Recent Labs  Lab 02/02/20 1635 02/02/20 2103 02/03/20 0646 02/03/20 1125 02/03/20 1631  GLUCAP 213* 142* 107* 115* 129*   Lipid Profile: No results for input(s): CHOL, HDL, LDLCALC, TRIG, CHOLHDL, LDLDIRECT in the last 72 hours. Thyroid Function Tests: No results for input(s): TSH, T4TOTAL, FREET4, T3FREE, THYROIDAB in the last 72 hours. Anemia Panel: No results for input(s): VITAMINB12, FOLATE, FERRITIN, TIBC, IRON, RETICCTPCT in the last 72 hours. Sepsis Labs: Recent Labs  Lab 01/31/20 1116 01/31/20 1330  LATICACIDVEN 1.5 1.2    Recent Results (from the past 240 hour(s))  Blood culture (routine x 2)     Status: None (Preliminary result)   Collection Time: 01/31/20 11:16 AM   Specimen: BLOOD RIGHT HAND  Result Value Ref Range Status   Specimen Description BLOOD RIGHT HAND  Final   Special Requests   Final    BOTTLES DRAWN AEROBIC ONLY Blood Culture results may not be  optimal due to an inadequate volume of blood received in culture bottles   Culture   Final    NO GROWTH 3 DAYS Performed at [facility], [place] [address], [place], [place] [address]    Report Status PENDING  Incomplete  Blood culture (routine x 2)     Status: None (Preliminary result)   Collection Time: 01/31/20 11:58 AM   Specimen: BLOOD  Result Value Ref Range Status   Specimen Description BLOOD RIGHT ANTECUBITAL  Final   Special Requests   Final    BOTTLES DRAWN AEROBIC AND ANAEROBIC Blood Culture results may not be optimal due to an inadequate volume of blood received in culture bottles   Culture   Final    NO GROWTH 3 DAYS Performed at [facility], [place] [address], [place], [place] [address]    Report Status PENDING  Incomplete  Respiratory Panel by RT PCR (Flu A&B, Covid) - Nasopharyngeal Swab     Status: None   Collection Time: 01/31/20 12:01 PM   Specimen: Nasopharyngeal Swab  Result Value Ref Range Status   SARS Coronavirus 2 by RT PCR NEGATIVE NEGATIVE Final    Comment: (NOTE) SARS-CoV-2 target nucleic acids are NOT DETECTED. The SARS-CoV-2 RNA is generally detectable in upper respiratoy specimens during the acute phase of infection. The lowest concentration of SARS-CoV-2 viral copies this assay can detect is 131 copies/mL. A negative result does not preclude SARS-Cov-2 infection and should not be used as the sole basis for treatment or other patient management decisions. A negative result may occur with  improper specimen collection/handling, submission of specimen other than nasopharyngeal swab, presence of viral mutation(s) within the areas targeted by this assay, and inadequate number of viral copies (<131 copies/mL). A negative result must be combined with clinical observations, patient history, and epidemiological information. The expected result is Negative. Fact Sheet for Patients:  PinkCheek.be Fact Sheet for Healthcare  Providers:  GravelBags.it This test is not yet ap proved or cleared by the [place] FDA and  has been authorized for detection and/or diagnosis of SARS-CoV-2 by FDA under an Emergency Use Authorization (EUA). This EUA will remain  in effect (meaning this test can be used) for the duration of the COVID-19 declaration  under Section 564(b)(1) of the Act, 21 U.S.C. section 360bbb-3(b)(1), unless the authorization is terminated or revoked sooner.    Influenza A by PCR NEGATIVE NEGATIVE Final   Influenza B by PCR NEGATIVE NEGATIVE Final    Comment: (NOTE) The Xpert Xpress SARS-CoV-2/FLU/RSV assay is intended as an aid in  the diagnosis of influenza from Nasopharyngeal swab specimens and  should not be used as a sole basis for treatment. Nasal washings and  aspirates are unacceptable for Xpert Xpress SARS-CoV-2/FLU/RSV  testing. Fact Sheet for Patients: PinkCheek.be Fact Sheet for Healthcare Providers: GravelBags.it This test is not yet approved or cleared by the [place] FDA and  has been authorized for detection and/or diagnosis of SARS-CoV-2 by  FDA under an Emergency Use Authorization (EUA). This EUA will remain  in effect (meaning this test can be used) for the duration of the  Covid-19 declaration under Section 564(b)(1) of the Act, 21  U.S.C. section 360bbb-3(b)(1), unless the authorization is  terminated or revoked. Performed at [facility], [place] [address], [place], [place] [address]   MRSA PCR Screening     Status: None   Collection Time: 02/01/20 12:00 AM   Specimen: Nasopharyngeal  Result Value Ref Range Status   MRSA by PCR NEGATIVE NEGATIVE Final    Comment:        The GeneXpert MRSA Assay (FDA approved for NASAL specimens only), is one component of a comprehensive MRSA colonization surveillance program. It is not intended to diagnose MRSA infection nor to guide  or monitor treatment for MRSA infections. Performed at [facility], [place] [address], [place],  [address]          Radiology Studies: MR FOOT LEFT WO CONTRAST  Result Date: 02/01/2020 CLINICAL DATA:  Non pressure chronic ulcer of the great toe, nail has fallen off EXAM: MRI OF THE LEFT FOOT WITHOUT CONTRAST TECHNIQUE: Multiplanar, multisequence MR imaging of the left was performed. No intravenous contrast was administered. COMPARISON:  None. FINDINGS: Bones/Joint/Cartilage There is a large area of ulceration seen overlying the dorsal surface of the distal first toe with no overlying nail. No soft tissue fluid collection is seen within the nail bed. There is diffuse overlying soft tissue swelling and skin thickening however. There is increased T2 hyperintense signal seen within the distal tuft of the first digit without T1 hypointensity. No acute fracture or areas of cortical destruction. No joint effusions are seen. Ligaments The Lisfranc ligaments are intact. Muscles and Tendons Diffusely increased signal seen throughout the muscles with diffuse fatty atrophy. The flexor extensor tendons appear to be intact. The plantar fascia is intact. Soft tissues Focal area of ulceration overlying the distal first digit as described above. There is dorsal subcutaneous edema. IMPRESSION: Focal area of ulceration at the first digit distal tuft with no nail. No sinus tract or soft tissue abscess. There is reactive marrow in the distal tuft of the first digit without definite evidence of osteomyelitis. 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 Dorsal subcutaneous edema. Electronically Signed   By: Prudencio Pair M.D.   On: 02/01/2020 23:54        Scheduled Meds: . sodium chloride   Intravenous Once  . allopurinol  100 mg Oral BID  . aspirin EC  325 mg Oral Daily  . brinzolamide  1 drop Both Eyes BID  . buPROPion  150 mg Oral BID  . Chlorhexidine Gluconate Cloth  6 each Topical  Q0600  . Chlorhexidine Gluconate Cloth  6 each Topical Q0600  . feeding supplement (NEPRO CARB STEADY)  237 mL Oral BID BM  . ferrous sulfate  325 mg Oral BID WC  . gabapentin  300 mg Oral Daily  . heparin  5,000 Units Subcutaneous Q8H  . insulin aspart  0-6 Units Subcutaneous TID WC  . latanoprost  1 drop Both Eyes QHS  . linaclotide  72 mcg Oral QAC breakfast  . metoprolol tartrate  25 mg Oral BID  . mometasone-formoterol  2 puff Inhalation BID  . multivitamin  1 tablet Oral QHS  . pantoprazole  40 mg Oral Daily  . pravastatin  40 mg Oral Daily  . sodium bicarbonate  650 mg Oral TID  . sodium chloride flush  3 mL Intravenous Q12H  . sucroferric oxyhydroxide  500 mg Oral TID WC   Continuous Infusions: . sodium chloride    . cefTRIAXone (ROCEPHIN)  IV 2 g (02/02/20 2157)  . ferric gluconate (FERRLECIT/NULECIT) IV Stopped (02/02/20 1744)     LOS: 3 days    Time spent: 26 min    Berle Mull, MD [facility]   To contact the attending provider between 7A-7P or the covering provider during after hours 7P-7A, please log into the web site [email or web address] and access using universal [place] password for that web site. If you do not have the password, please call the hospital operator.  02/03/2020, 8:35 PM

## 2020-02-03 NOTE — Progress Notes (Signed)
[facility] Progress Note   Subjective:   Patient seen and examined at bedside.  Reports not being able to sleep last night.  Chest pain resolved.  Toe continues to be painful.  Denies CP, SOB, n/v/d, abdominal pain, dizziness and fatigue.   Objective Vitals:   02/03/20 0442 02/03/20 0500 02/03/20 0732 02/03/20 0954  BP: 127/61   (!) 145/56  Pulse: 73   72  Resp: 17   18  Temp: 98.8 F (37.1 C)   98.7 F (37.1 C)  TempSrc: Oral   Oral  SpO2: 97%  98% 98%  Weight:  119.2 kg    Height:       Physical Exam General:NAD, obese female Heart:RRR Lungs:CTAB, nml WOB Abdomen:soft, NTND Extremities:trace LE edema, L great toe w/dry eschar Dialysis Access: [facility]   Filed Weights   02/02/20 1358 02/02/20 2105 02/03/20 0500  Weight: 117.6 kg 119.2 kg 119.2 kg    Intake/Output Summary (Last 24 hours) at 02/03/2020 1132 Last data filed at 02/03/2020 0900 Gross per 24 hour  Intake 3365.77 ml  Output 3800 ml  Net -434.23 ml    Additional Objective Labs: Basic Metabolic Panel: Recent Labs  Lab 02/01/20 0528 02/02/20 0400 02/03/20 0415  NA 141 141 140  K 4.7 4.2 3.9  CL 101 103 100  CO2 25 24 26   GLUCOSE 112* 138* 115*  BUN 40* 52* 37*  CREATININE 5.07* 6.68* 5.09*  CALCIUM 7.9* 8.0* 8.4*  PHOS 4.4  --   --    Liver Function Tests: Recent Labs  Lab 01/31/20 1056 02/01/20 0528  AST 12*  --   ALT 10  --   ALKPHOS 111  --   BILITOT 0.3  --   PROT 6.5  --   ALBUMIN 2.8* 2.5*   Recent Labs  Lab 01/31/20 1056  LIPASE 26   CBC: Recent Labs  Lab 01/31/20 1056 01/31/20 1056 02/01/20 0528 02/01/20 0528 02/02/20 0400 02/02/20 1524 02/03/20 0415  WBC 7.9   < > 8.0  --  6.9  --  9.2  NEUTROABS 5.7  --   --   --   --   --   --   HGB 7.8*   < > 7.0*   < > 6.7* 9.1* 9.2*  HCT 27.1*   < > 25.0*   < > 23.8* 29.9* 30.7*  MCV 86.9  --  87.7  --  87.2  --  85.0  PLT 461*   < > 377  --  358  --  353   < > = values in this interval not displayed.    CBG: Recent Labs  Lab 02/02/20 1455 02/02/20 1635 02/02/20 2103 02/03/20 0646 02/03/20 1125  GLUCAP 161* 213* 142* 107* 115*   Studies/Results: MR FOOT LEFT WO CONTRAST  Result Date: 02/01/2020 CLINICAL DATA:  Non pressure chronic ulcer of the great toe, nail has fallen off EXAM: MRI OF THE LEFT FOOT WITHOUT CONTRAST TECHNIQUE: Multiplanar, multisequence MR imaging of the left was performed. No intravenous contrast was administered. COMPARISON:  None. FINDINGS: Bones/Joint/Cartilage There is a large area of ulceration seen overlying the dorsal surface of the distal first toe with no overlying nail. No soft tissue fluid collection is seen within the nail bed. There is diffuse overlying soft tissue swelling and skin thickening however. There is increased T2 hyperintense signal seen within the distal tuft of the first digit without T1 hypointensity. No acute fracture or areas of cortical destruction. No joint effusions  are seen. Ligaments The Lisfranc ligaments are intact. Muscles and Tendons Diffusely increased signal seen throughout the muscles with diffuse fatty atrophy. The flexor extensor tendons appear to be intact. The plantar fascia is intact. Soft tissues Focal area of ulceration overlying the distal first digit as described above. There is dorsal subcutaneous edema. IMPRESSION: Focal area of ulceration at the first digit distal tuft with no nail. No sinus tract or soft tissue abscess. There is reactive marrow in the distal tuft of the first digit without definite evidence of osteomyelitis. 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 Dorsal subcutaneous edema. Electronically Signed   By: Prudencio Pair M.D.   On: 02/01/2020 23:54   VAS [place] ABI WITH/WO TBI  Result Date: 02/01/2020 LOWER EXTREMITY DOPPLER STUDY Indications: Gangrene.  Limitations: Today's exam was limited due to patient positioning, patient              intolerant to cuff pressure, an open wound and poor patient               cooperation, patient pain tolerance, patient constant movement. Comparison Study: No prior studies. Performing Technologist: Carlos Levering Rvt  Examination Guidelines: A complete evaluation includes at minimum, Doppler waveform signals and systolic blood pressure reading at the level of bilateral brachial, anterior tibial, and posterior tibial arteries, when vessel segments are accessible. Bilateral testing is considered an integral part of a complete examination. Photoelectric Plethysmograph (PPG) waveforms and toe systolic pressure readings are included as required and additional duplex testing as needed. Limited examinations for reoccurring indications may be performed as noted.  ABI Findings: +--------+------------------+-----+----------+--------+ Right   Rt Pressure (mmHg)IndexWaveform  Comment  +--------+------------------+-----+----------+--------+ [id number]                    triphasic          +--------+------------------+-----+----------+--------+ PTA     86                0.72 monophasic         +--------+------------------+-----+----------+--------+ DP      122               1.03 monophasic         +--------+------------------+-----+----------+--------+ +--------+------------------+-----+--------+--------------+ Left    Lt Pressure (mmHg)IndexWaveformComment        +--------+------------------+-----+--------+--------------+ Brachial                               Restricted arm +--------+------------------+-----+--------+--------------+  Summary: Right: Values are likely unreliable due to constant patient movement and poor cooperation. Left: Patient refused further testing due to pain tolerance.  *See table(s) above for measurements and observations.  Electronically signed by Curt Jews MD on 02/01/2020 at 5:07:48 PM.   Final     Medications: . sodium chloride    . cefTRIAXone (ROCEPHIN)  IV 2 g (02/02/20 2157)  . ferric gluconate (FERRLECIT/NULECIT)  IV Stopped (02/02/20 1744)   . sodium chloride   Intravenous Once  . allopurinol  100 mg Oral BID  . aspirin EC  325 mg Oral Daily  . brinzolamide  1 drop Both Eyes BID  . buPROPion  150 mg Oral BID  . Chlorhexidine Gluconate Cloth  6 each Topical Q0600  . feeding supplement (NEPRO CARB STEADY)  237 mL Oral BID BM  . ferrous sulfate  325 mg Oral BID WC  . gabapentin  300 mg Oral Daily  . heparin  5,000 Units Subcutaneous Q8H  .  insulin aspart  0-6 Units Subcutaneous TID WC  . latanoprost  1 drop Both Eyes QHS  . linaclotide  72 mcg Oral QAC breakfast  . metoprolol tartrate  25 mg Oral BID  . mometasone-formoterol  2 puff Inhalation BID  . multivitamin  1 tablet Oral QHS  . pantoprazole  40 mg Oral Daily  . pravastatin  40 mg Oral Daily  . sodium bicarbonate  650 mg Oral TID  . sodium chloride flush  3 mL Intravenous Q12H  . sucroferric oxyhydroxide  500 mg Oral TID WC    Dialysis Orders: [facility] T,Th,S 4 hours 15 min 180NRe 400/800 118kg 2.0 K/ 2.0 Ca TDC/AVF -No heparin -Mircera 225 mcg IV q 2 weeks  ( LAST  ON 01/26/20) -Venofer 100 mg IVqtx through 2/11 -Calcitriol 1.17mcg PO TIW   Assessment/Plan: 1. Acute hypoxic respiratory failure 2/2 volume overload - Resolved. HD tolerated well, net UF 3L removed.  2. ESRD - on HD TTS. Orders written for HD tomorrow per regular schedule if remains admitted. K 3.9. No heparin. 3. Anemia of CKD- Hgb 9.2. this AM, s/p 2 unit pRBC on 2/4. ESA due 2/11. 4. Secondary hyperparathyroidism - Ca and phos ok. Continue binders, VDRA.  5. HTN/volume - Blood pressure variable.  Does not appear volume overloaded.  Plan for UF to dry.  6. Nutrition - Renal diet w/fluid restrictions 7. L toe diabetic ulcer - on Vanc, rocephin, metronidazole 8. Tobacco abuse - encouraged cessation 9. Debilitation 10. Hx CAD s/p CABG  Jen Mow, PA-C [facility] Pager: [phone] 02/03/2020,11:33 AM  LOS: 3 days

## 2020-02-03 NOTE — Consult Note (Addendum)
[facility] NOTE   MRN : [id number]  Reason for Consult: Left GT wound with foot cellulitis Referring Physician: [facility]  History of Present Illness: 69 y/o female with 2 month history of non healing GT and nail loss.  No known injury.  She is diabetic and ESRD.  She states that over 2 months ago before the toe wound appeared she could walk as far as she wanted without calf pain with ambulation, rest pain, or previous non healing wounds.    She is being followed by our office for left UE AV fistula creation by Dr. Donzetta Matters 08/25/19.  She was last seen by myself for pain and swelling with ecchymosis surrounding her left UE AV fistula after HD use.    Past medical history includes:  CAD s/p CABG, combined systolic and diastolic CHF, PVD, ESRD on HD(TTS), DM type II, hyperparathyroidism, asthma, and polysubstance abuse.      Current Facility-Administered Medications  Medication Dose Route Frequency Provider Last Rate Last Admin  . 0.9 %  sodium chloride infusion (Manually program via Guardrails IV Fluids)   Intravenous Once Lang Snow, FNP      . 0.9 %  sodium chloride infusion   Intravenous Continuous Monia Sabal, PA-C      . acetaminophen (TYLENOL) tablet 650 mg  650 mg Oral Q6H PRN Norval Morton, MD       Or  . acetaminophen (TYLENOL) suppository 650 mg  650 mg Rectal Q6H PRN Fuller Plan A, MD      . allopurinol (ZYLOPRIM) tablet 100 mg  100 mg Oral BID Fuller Plan A, MD   100 mg at 02/03/20 0835  . aspirin EC tablet 325 mg  325 mg Oral Daily Fuller Plan A, MD   325 mg at 02/03/20 0835  . brinzolamide (AZOPT) 1 % ophthalmic suspension 1 drop  1 drop Both Eyes BID Fuller Plan A, MD   1 drop at 02/03/20 0834  . buPROPion (WELLBUTRIN SR) 12 hr tablet 150 mg  150 mg Oral BID Fuller Plan A, MD   150 mg at 02/03/20 0835  . cefTRIAXone (ROCEPHIN) 2 g in sodium chloride 0.9 % 100 mL IVPB  2 g Intravenous Q24H Tamala Julian, Rondell A, MD 200  mL/hr at 02/02/20 2157 2 g at 02/02/20 2157  . Chlorhexidine Gluconate Cloth 2 % PADS 6 each  6 each Topical Q0600 Norval Morton, MD   6 each at 02/03/20 [phone]  . Chlorhexidine Gluconate Cloth 2 % PADS 6 each  6 each Topical Q0600 Penninger, Filip, PA      . feeding supplement (NEPRO CARB STEADY) liquid 237 mL  237 mL Oral BID BM Matcha, Anupama, MD   237 mL at 02/03/20 1553  . ferric gluconate (NULECIT) 125 mg in sodium chloride 0.9 % 100 mL IVPB  125 mg Intravenous Q T,Th,Sa-HD Norval Morton, MD   Stopped at 02/02/20 1744  . ferrous sulfate tablet 325 mg  325 mg Oral BID WC Smith, Rondell A, MD   325 mg at 02/03/20 0831  . gabapentin (NEURONTIN) capsule 300 mg  300 mg Oral Daily Tamala Julian, Rondell A, MD   300 mg at 02/03/20 0836  . heparin injection 5,000 Units  5,000 Units Subcutaneous Q8H Matcha, Anupama, MD   5,000 Units at 02/03/20 0512  . insulin aspart (novoLOG) injection 0-6 Units  0-6 Units Subcutaneous TID WC Dessa Phi, DO   2 Units at 02/02/20 1713  . ipratropium-albuterol (DUONEB) 0.5-2.5 (  3) MG/3ML nebulizer solution 3 mL  3 mL Nebulization Q6H PRN Smith, Rondell A, MD      . latanoprost (XALATAN) 0.005 % ophthalmic solution 1 drop  1 drop Both Eyes QHS Tamala Julian, Rondell A, MD   1 drop at 02/02/20 2105  . linaclotide (LINZESS) capsule 72 mcg  72 mcg Oral QAC breakfast Fuller Plan A, MD   72 mcg at 02/03/20 1696  . metoprolol tartrate (LOPRESSOR) tablet 25 mg  25 mg Oral BID Fuller Plan A, MD   25 mg at 02/03/20 7893  . mometasone-formoterol (DULERA) 100-5 MCG/ACT inhaler 2 puff  2 puff Inhalation BID Fuller Plan A, MD   2 puff at 02/03/20 0731  . multivitamin (RENA-VIT) tablet 1 tablet  1 tablet Oral QHS Yaakov Guthrie, MD   1 tablet at 02/02/20 2103  . nitroGLYCERIN (NITROSTAT) SL tablet 0.4 mg  0.4 mg Sublingual Q5 min PRN Fuller Plan A, MD      . ondansetron (ZOFRAN) tablet 4 mg  4 mg Oral Q6H PRN Fuller Plan A, MD       Or  . ondansetron (ZOFRAN) injection 4  mg  4 mg Intravenous Q6H PRN Smith, Rondell A, MD      . pantoprazole (PROTONIX) EC tablet 40 mg  40 mg Oral Daily Tamala Julian, Rondell A, MD   40 mg at 02/03/20 0836  . pravastatin (PRAVACHOL) tablet 40 mg  40 mg Oral Daily Fuller Plan A, MD   40 mg at 02/03/20 0836  . sodium bicarbonate tablet 650 mg  650 mg Oral TID Norval Morton, MD   650 mg at 02/03/20 0835  . sodium chloride flush (NS) 0.9 % injection 3 mL  3 mL Intravenous Q12H Smith, Rondell A, MD   3 mL at 02/03/20 0834  . sucroferric oxyhydroxide (VELPHORO) chewable tablet 500 mg  500 mg Oral TID WC Smith, Rondell A, MD   500 mg at 02/03/20 1206    Pt meds include: Statin :Yes Betablocker: Yes ASA: Yes Other anticoagulants/antiplatelets: none  Past Medical History:  Diagnosis Date  . Abscess   . Acute blood loss anemia 08/17/2019  . Acute respiratory failure ([place]) 10/18/2014  . Acute respiratory failure with hypoxia and hypercapnia ([place]) 06/01/2019  . Anemia 08/2016  . Angiodysplasia of colon   . Arthritis of left shoulder region 03/23/2013  . Bleeding gastrointestinal   . Cardiomegaly 05/2019  . Chest pain 04/17/2016  . Chronic combined systolic and diastolic CHF (congestive heart failure) (HCC)    a. EF 40-45%, mild LVH, mid apicalanteroseptal and apical HK.  . CKD (chronic kidney disease), stage III   . Cocaine abuse ([place])    crack cocaine heavily until 2008 then sporadic use since then  . Coronary artery disease    a. 06/2012 NSTEMI/CABG x 3 (LIMA->LAD, VG->OM2, VG->LCX);  b. 04/2015 MV: EF<30%, mid ant, apicalanterior, apical infarct;  c. 04/2015 Cath: LM nl, LAD 90p, LCX 55m, OM1 min irregs, RCA mild dzs, LIMA->LAD nl w/ dist LAD dzs, VG->OM2 nl, VG->LCX nl-->Med Rx.  . CVA (cerebral infarction)    a. right internal capsule stroke in 12/2006  . Demand ischemia ([place])   . Diabetes mellitus    diagnosed in 2008  . Elevated troponin 04/27/2019  . Essential hypertension   . Glaucoma   . Gout   . Heme positive stool   .  HFrEF (heart failure with reduced ejection fraction) ([place])   . Hyperlipidemia   . Hyperparathyroidism, secondary renal ([place])   .  Hypertensive crisis 06/02/2019  . Left-sided sensory deficit present   . Lobar pneumonia ([place]) 04/27/2019  . Obesity, morbid ([place])   . Pneumonia   . Pulmonary edema 05/2019  . PVD (peripheral vascular disease) ([place])    a. 06/2012 ABI's: R - 0.73, L - 0.71.  Marland Kitchen Renal mass, right   . Sepsis ([place]) 04/27/2019  . Shortness of breath dyspnea   . Stroke ([place])   . Thrombocytosis ([place]) 04/17/2016  . Thyroid nodule    FNA in 8315 showed follicular cells but not definate neoplasm  . Tobacco abuse   . Trichomoniasis     Past Surgical History:  Procedure Laterality Date  . AV FISTULA PLACEMENT Left 08/25/2019   Procedure: ARTERIOVENOUS (AV) BRACHIOCEPHALIC FISTULA CREATION;  Surgeon: Waynetta Sandy, MD;  Location: [place];  Service: Vascular;  Laterality: Left;  . CARDIAC CATHETERIZATION    . CARDIAC CATHETERIZATION N/A 05/17/2015   Procedure: Left Heart Cath and Cors/Grafts Angiography;  Surgeon: [name], MD;  Location: [place] CV LAB;  Service: Cardiovascular;  Laterality: N/A;  . COLONOSCOPY WITH PROPOFOL N/A 04/21/2016   Procedure: COLONOSCOPY WITH PROPOFOL;  Surgeon: Irene Shipper, MD;  Location: [place];  Service: Endoscopy;  Laterality: N/A;  . CORONARY ARTERY BYPASS GRAFT  07/09/2012   Procedure: CORONARY ARTERY BYPASS GRAFTING (CABG);  Surgeon: Ivin Poot, MD;  Location: [place];  Service: Open Heart Surgery;  Laterality: N/A;  . ENTEROSCOPY N/A 10/07/2019   Procedure: ENTEROSCOPY;  Surgeon: Jackquline Denmark, MD;  Location: [facility] ENDOSCOPY;  Service: Endoscopy;  Laterality: N/A;  . ESOPHAGOGASTRODUODENOSCOPY N/A 04/20/2016   Procedure: ESOPHAGOGASTRODUODENOSCOPY (EGD);  Surgeon: Gatha Mayer, MD;  Location: [facility] ENDOSCOPY;  Service: Endoscopy;  Laterality: N/A;  . ESOPHAGOGASTRODUODENOSCOPY (EGD) WITH PROPOFOL N/A 08/17/2019   Procedure:  ESOPHAGOGASTRODUODENOSCOPY (EGD) WITH PROPOFOL;  Surgeon: Irene Shipper, MD;  Location: [facility] ENDOSCOPY;  Service: Endoscopy;  Laterality: N/A;  . INSERTION OF DIALYSIS CATHETER Right 08/25/2019   Procedure: INSERTION OF DIALYSIS CATHETER;  Surgeon: Waynetta Sandy, MD;  Location: [place];  Service: Vascular;  Laterality: Right;  . IR FLUORO GUIDE CV LINE RIGHT  08/19/2019  . IR US GUIDE VASC ACCESS RIGHT  08/19/2019  . LEFT HEART CATHETERIZATION WITH CORONARY ANGIOGRAM N/A 06/29/2012   Procedure: LEFT HEART CATHETERIZATION WITH CORONARY ANGIOGRAM;  Surgeon: Peter M Martinique, MD;  Location: [facility] CATH LAB;  Service: Cardiovascular;  Laterality: N/A;  . STERNAL WOUND DEBRIDEMENT  08/17/2012   Procedure: STERNAL WOUND DEBRIDEMENT;  Surgeon: Ivin Poot, MD;  Location: [facility] OR;  Service: Thoracic;  Laterality: N/A;  wound vac application  . STERNAL WOUND DEBRIDEMENT  08/24/2012   Procedure: STERNAL WOUND DEBRIDEMENT;  Surgeon: Ivin Poot, MD;  Location: [place];  Service: Thoracic;  Laterality: N/A;  . STERNAL WOUND DEBRIDEMENT  09/01/2012   Procedure: STERNAL WOUND DEBRIDEMENT;  Surgeon: Ivin Poot, MD;  Location: [place];  Service: Thoracic;  Laterality: N/A;  . STERNAL WOUND DEBRIDEMENT  09/20/2012   Procedure: STERNAL WOUND DEBRIDEMENT;  Surgeon: Ivin Poot, MD;  Location[facility] OR;  Service: Thoracic;  Laterality: N/A;  wound vac change  . SUBMUCOSAL TATTOO INJECTION  10/07/2019   Procedure: SUBMUCOSAL TATTOO INJECTION;  Surgeon: Jackquline Denmark, MD;  Location: [facility] ENDOSCOPY;  Service: Endoscopy;;    Social History Social History   Tobacco Use  . Smoking status: Current Some Day Smoker    Packs/day: 0.25    Years: 50.00    Pack years: 12.50  Types: Cigarettes  . Smokeless tobacco: Never Used  Substance Use Topics  . Alcohol use: No    Alcohol/week: 0.0 standard drinks  . Drug use: Yes    Types: Cocaine    Comment: pt denies at current time    Family History Family History   Problem Relation Age of Onset  . Diabetes Mother   . Hypertension Mother   . Cancer Mother   . Hyperlipidemia Father   . Hypertension Father   . Kidney disease Father   . Gout Father   . Cerebrovascular Accident Father   . Other Other        no known family CAD    Allergies  Allergen Reactions  . Naproxen Rash     REVIEW OF SYSTEMS  General: [ ]  Weight loss, [ ]  Fever, [ ]  chills Neurologic: [ ]  Dizziness, [ ]  Blackouts, [ ]  Seizure [ ]  Stroke, [ ]  "Mini stroke", [ ]  Slurred speech, [ ]  Temporary blindness; [ ]  weakness in arms or legs, [ ]  Hoarseness [ ]  Dysphagia Cardiac: [ ]  Chest pain/pressure, [ ]  Shortness of breath at rest [ ]  Shortness of breath with exertion, [ ]  Atrial fibrillation or irregular heartbeat  Vascular: [x ] Pain in legs with walking, [ ]  Pain in legs at rest, [ ]  Pain in legs at night,  [ ]  Non-healing ulcer, [ ]  Blood clot in vein/DVT,   Pulmonary: [ ]  Home oxygen, [ ]  Productive cough, [ ]  Coughing up blood, [ ]  Asthma,  [ ]  Wheezing [ ]  COPD Musculoskeletal:  [ ]  Arthritis, [ ]  Low back pain, [ ]  Joint pain Hematologic: [ ]  Easy Bruising, [ ]  Anemia; [ ]  Hepatitis Gastrointestinal: [ ]  Blood in stool, [ ]  Gastroesophageal Reflux/heartburn, Urinary: [ ]  chronic Kidney disease, [x ] on HD - [ ]  MWF or [x ] TTHS, [ ]  Burning with urination, [ ]  Difficulty urinating Skin: [ ]  Rashes, [x ] Wounds left GT Psychological: [ ]  Anxiety, [ ]  Depression  Physical Examination Vitals:   02/03/20 0442 02/03/20 0500 02/03/20 0732 02/03/20 0954  BP: 127/61   (!) 145/56  Pulse: 73   72  Resp: 17   18  Temp: 98.8 F (37.1 C)   98.7 F (37.1 C)  TempSrc: Oral   Oral  SpO2: 97%  98% 98%  Weight:  119.2 kg    Height:       Body mass index is 39.96 kg/m.  General:  WDWN in NAD, obese HENT: WNL Eyes: Pupils equal Pulmonary: normal non-labored breathing , without Rales, rhonchi,  wheezing Cardiac: RRR, without  Murmurs, rubs or gallops; No carotid  bruits Abdomen: soft, NT, no masses Skin: no rashes, ulcers noted;  Positive dry Gangrene left GT with  cellulitis; no open wounds;   Vascular Exam/Pulses:Palpable radial pulses, palpable left AV fistula thrill distal 1/2.  Palpable left Femoral pulse, Doppler PT B LE.     Musculoskeletal: no muscle wasting or atrophy; no edema  Neurologic: A&O X 3; Appropriate Affect ;  SENSATION: normal; MOTOR FUNCTION: grossly intact B Symmetric Speech is fluent/normal   Significant Diagnostic Studies: CBC Lab Results  Component Value Date   WBC 9.2 02/03/2020   HGB 9.2 (L) 02/03/2020   HCT 30.7 (L) 02/03/2020   MCV 85.0 02/03/2020   PLT 353 02/03/2020    BMET    Component Value Date/Time   NA 140 02/03/2020 0415   K 3.9 02/03/2020 0415   CL 100 02/03/2020  0415   CO2 26 02/03/2020 0415   GLUCOSE 115 (H) 02/03/2020 0415   BUN 37 (H) 02/03/2020 0415   CREATININE 5.09 (H) 02/03/2020 0415   CREATININE 2.62 (H) 04/29/2016 1629   CALCIUM 8.4 (L) 02/03/2020 0415   GFRNONAA 8 (L) 02/03/2020 0415   GFRAA 9 (L) 02/03/2020 0415   Estimated Creatinine Clearance: 14.2 mL/min (A) (by C-G formula based on SCr of 5.09 mg/dL (H)).  COAG Lab Results  Component Value Date   INR 1.2 08/25/2019   INR 1.2 04/28/2019   INR 1.17 09/23/2016     Non-Invasive Vascular Imaging:  ABI Findings:  +--------+------------------+-----+----------+--------+  Right  Rt Pressure (mmHg)IndexWaveform Comment   +--------+------------------+-----+----------+--------+  [id number]           triphasic       +--------+------------------+-----+----------+--------+  PTA   86        0.72 monophasic      +--------+------------------+-----+----------+--------+  DP   122        1.03 monophasic      +--------+------------------+-----+----------+--------+   +--------+------------------+-----+--------+--------------+  Left  Lt Pressure  (mmHg)IndexWaveformComment      +--------+------------------+-----+--------+--------------+  Brachial                Restricted arm  +--------+------------------+-----+--------+--------------+    +------------+----------+-------------+----------+-----------------------------+ OUTFLOW VEINPSV (cm/s)Diameter (cm)Depth (cm) Describe  +------------+----------+-------------+----------+-----------------------------+ Prox UA  146  0.79  1.16  branch 0.365 cm velocity at       confluence 250cm/s  +------------+----------+-------------+----------+-----------------------------+ Mid UA  77  0.96  0.93   +------------+----------+-------------+----------+-----------------------------+ Dist UA  226  1.05  0.25  branch 0.595 cm  +------------+----------+-------------+----------+-----------------------------+ AC Fossa  65  1.30  0.34  branch 0.621 cm  +------------+----------+-------------+----------+-----------------------------+  ASSESSMENT/PLAN:   PAD and DM  Left GT dry gangrene with foot cellulitis and pain to palpation.  > 2 month history.    She could not tolerate ABI on the left LE. Right LE ABI demonstrates PAD with monophasic flow calcification or vessels.   She would benefit from angiogram with run off and possible intervetion.  We will schedule this in the near future.     ESRD:  Poorly accessed fistula.She may need superficialization of the fistula and branch ligation in the future.   The fistula has good diameter > 0.7, bu mid fistula and proximally it is deep with large branches. A   Roxy Horseman 02/03/2020 4:12 PM   Agree with above.  Diabetic smoker with ESRD.  Femoral pulse difficult to palpate most likely due  to obesity.  No pedal pulses.  Gangrene left first toe.  Will schedule for Angiogram with possible intervention by Dr Donzetta Matters on Monday 2/8  Risks benefits possible complications including but not limited to bleeding infection vessel injury discussed with pt as well as the fact she is at high risk of limb loss and will lose the first toe at a minimum  Ruta Hinds, MD [facility] [place]: [phone]

## 2020-02-03 NOTE — Plan of Care (Signed)
  Problem: Education: Goal: Knowledge of General Education information will improve Description: Including pain rating scale, medication(s)/side effects and non-pharmacologic comfort measures Outcome: Progressing   Problem: Activity: Goal: Risk for activity intolerance will decrease Outcome: Progressing   

## 2020-02-03 NOTE — Progress Notes (Signed)
Physical Therapy Treatment Patient Details Name: [name] [name] MRN: [id number] DOB: 11/21/1951 Today's Date: 02/03/2020    History of Present Illness [name] [name] is a 69 y.o. female with medical history significant of CAD s/p CABG, combined systolic and diastolic CHF, PVD, ESRD on HD (TTS), DM type II, hyperparathyroidism, asthma, polysubstance abuse, tobacco abuse. She presents with complaints of chest pain over the last 3 days, likely musculoskeletal in nature. Also with left great toe diabetic foot ulcer; pending MRI to rule out osteomyelitis.    PT Comments    Pt was seen for gait with severe pain on L foot to use even a sock to protect the foot. Will recommend her to get a protective shoe as she cannot wear her regular shoes and is clear that the pain from L foot during gait is a problem.  Follow acutely for a program of increased time standing and walking to get her finished with rehab stay faster, and to get home with assistance.   Follow Up Recommendations  SNF     Equipment Recommendations  Rolling walker with 5" wheels;3in1 (PT)    Recommendations for Other Services       Precautions / Restrictions Precautions Precautions: Fall Restrictions Weight Bearing Restrictions: No    Mobility  Bed Mobility Overal bed mobility: Modified Independent                Transfers Overall transfer level: Needs assistance Equipment used: Rolling walker (2 wheeled);1 person hand held assist Transfers: Sit to/from Stand Sit to Stand: Min guard         General transfer comment: pt is able to power up but steadied with PT  Ambulation/Gait Ambulation/Gait assistance: Min guard Gait Distance (Feet): 140 Feet Assistive device: Rolling walker (2 wheeled) Gait Pattern/deviations: Step-through pattern;Decreased stride length;Wide base of support;Trunk flexed Gait velocity: decreased Gait velocity interpretation: <1.31 ft/sec, indicative of household ambulator General Gait  Details: reduced WB on LLE   Stairs             Wheelchair Mobility    Modified Rankin (Stroke Patients Only)       Balance Overall balance assessment: Needs assistance Sitting-balance support: Feet supported Sitting balance-Leahy Scale: Good     Standing balance support: Bilateral upper extremity supported;During functional activity Standing balance-Leahy Scale: Fair                              Cognition Arousal/Alertness: Awake/alert Behavior During Therapy: WFL for tasks assessed/performed Overall Cognitive Status: Within Functional Limits for tasks assessed                                        Exercises General Exercises - Lower Extremity Ankle Circles/Pumps: AROM;10 reps Long Arc Quad: AROM;10 reps Heel Slides: AROM;10 reps Hip ABduction/ADduction: AROM;10 reps;Strengthening Hip Flexion/Marching: AROM;10 reps    General Comments        Pertinent Vitals/Pain Pain Assessment: Faces Faces Pain Scale: Hurts even more Pain Location: left first toe Pain Descriptors / Indicators: Discomfort;Grimacing;Guarding Pain Intervention(s): Limited activity within patient's tolerance;Monitored during session;Premedicated before session;Repositioned;Other (comment)(asked for ortho shoe order)    Home Living                      Prior Function            PT  Goals (current goals can now be found in the care plan section) Acute Rehab PT Goals Patient Stated Goal: "go to rehab." Progress towards PT goals: Progressing toward goals    Frequency    Min 3X/week      PT Plan Current plan remains appropriate    Co-evaluation              AM-PAC PT "6 Clicks" Mobility   Outcome Measure  Help needed turning from your back to your side while in a flat bed without using bedrails?: None Help needed moving from lying on your back to sitting on the side of a flat bed without using bedrails?: A Little Help needed moving  to and from a bed to a chair (including a wheelchair)?: A Little Help needed standing up from a chair using your arms (e.g., wheelchair or bedside chair)?: A Little Help needed to walk in hospital room?: A Little Help needed climbing 3-5 steps with a railing? : A Lot 6 Click Score: 18    End of Session Equipment Utilized During Treatment: Gait belt Activity Tolerance: Patient tolerated treatment well Patient left: in bed;with call bell/phone within reach Nurse Communication: Mobility status PT Visit Diagnosis: Unsteadiness on feet (R26.81);Pain;Difficulty in walking, not elsewhere classified (R26.2) Pain - Right/Left: Left Pain - part of body: Ankle and joints of foot     Time: 1034-1100 PT Time Calculation (min) (ACUTE ONLY): 26 min  Charges:  $Gait Training: 8-22 mins $Therapeutic Exercise: 8-22 mins                    Ramond Dial 02/03/2020, 1:44 PM   Mee Hives, PT MS Acute Rehab Dept. Number: [place] [facility] [place]

## 2020-02-03 NOTE — Progress Notes (Signed)
Orthopedic Tech Progress Note Patient Details:  [name] [name] 1951/03/04 [id number]  Ortho Devices Type of Ortho Device: Postop shoe/boot Ortho Device/Splint Location: LLE Ortho Device/Splint Interventions: Application, Ordered   Post Interventions Patient Tolerated: Well Instructions Provided: Care of device, Adjustment of device   Janit Pagan 02/03/2020, 12:34 PM

## 2020-02-04 LAB — CBC
HCT: 28.6 % — ABNORMAL LOW (ref 36.0–46.0)
Hemoglobin: 8.4 g/dL — ABNORMAL LOW (ref 12.0–15.0)
MCH: 26 pg (ref 26.0–34.0)
MCHC: 29.4 g/dL — ABNORMAL LOW (ref 30.0–36.0)
MCV: 88.5 fL (ref 80.0–100.0)
Platelets: 324 10*3/uL (ref 150–400)
RBC: 3.23 MIL/uL — ABNORMAL LOW (ref 3.87–5.11)
RDW: 20.8 % — ABNORMAL HIGH (ref 11.5–15.5)
WBC: 7.4 10*3/uL (ref 4.0–10.5)
nRBC: 0.7 % — ABNORMAL HIGH (ref 0.0–0.2)

## 2020-02-04 LAB — RENAL FUNCTION PANEL
Albumin: 2.5 g/dL — ABNORMAL LOW (ref 3.5–5.0)
Anion gap: 16 — ABNORMAL HIGH (ref 5–15)
BUN: 51 mg/dL — ABNORMAL HIGH (ref 8–23)
CO2: 25 mmol/L (ref 22–32)
Calcium: 8.2 mg/dL — ABNORMAL LOW (ref 8.9–10.3)
Chloride: 101 mmol/L (ref 98–111)
Creatinine, Ser: 6.57 mg/dL — ABNORMAL HIGH (ref 0.44–1.00)
GFR calc Af Amer: 7 mL/min — ABNORMAL LOW (ref >60)
GFR calc non Af Amer: 6 mL/min — ABNORMAL LOW (ref >60)
Glucose, Bld: 80 mg/dL (ref 70–99)
Phosphorus: 6.1 mg/dL — ABNORMAL HIGH (ref 2.5–4.6)
Potassium: 3.8 mmol/L (ref 3.5–5.1)
Sodium: 142 mmol/L (ref 135–145)

## 2020-02-04 LAB — GLUCOSE, CAPILLARY
Glucose-Capillary: 76 mg/dL (ref 70–99)
Glucose-Capillary: 140 mg/dL — ABNORMAL HIGH (ref 70–99)
Glucose-Capillary: 225 mg/dL — ABNORMAL HIGH (ref 70–99)
Glucose-Capillary: 128 mg/dL — ABNORMAL HIGH (ref 70–99)

## 2020-02-04 MED ORDER — ACETAMINOPHEN 325 MG PO TABS
ORAL_TABLET | ORAL | Status: AC
  Filled 2020-02-04: qty 2

## 2020-02-04 MED ORDER — LIDOCAINE-PRILOCAINE 2.5-2.5 % EX CREA: 1.0000 "application " | TOPICAL_CREAM | CUTANEOUS | Status: DC | PRN

## 2020-02-04 MED ORDER — ALTEPLASE 2 MG IJ SOLR: 2.0000 mg | Freq: Once | INTRAMUSCULAR | Status: DC | PRN

## 2020-02-04 MED ORDER — PENTAFLUOROPROP-TETRAFLUOROETH EX AERO: 1.0000 "application " | INHALATION_SPRAY | CUTANEOUS | Status: DC | PRN

## 2020-02-04 MED ORDER — HEPARIN SODIUM (PORCINE) 1000 UNIT/ML DIALYSIS: 1000.0000 [IU] | INTRAMUSCULAR | Status: DC | PRN

## 2020-02-04 MED ORDER — SODIUM CHLORIDE 0.9 % IV SOLN: 100.0000 mL | INTRAVENOUS | Status: DC | PRN

## 2020-02-04 MED ORDER — HEPARIN SODIUM (PORCINE) 1000 UNIT/ML IJ SOLN
INTRAMUSCULAR | Status: AC
  Administered 2020-02-04: 3000 [IU] via INTRAVENOUS_CENTRAL
  Filled 2020-02-04: qty 3

## 2020-02-04 MED ORDER — LIDOCAINE HCL (PF) 1 % IJ SOLN: 5.0000 mL | INTRAMUSCULAR | Status: DC | PRN

## 2020-02-04 NOTE — NC FL2 (Signed)
[place] LEVEL OF CARE SCREENING TOOL     IDENTIFICATION  Patient Name: [name] [name] Birthdate: [birth year] Sex: female Admission Date (Current Location): 01/31/2020  [facility] and [place] Number:  Herbalist and Address:  [facility] [place], [place] [address] [place], [place] [address]      Provider Number: [id number]  Attending Physician Name and Address:  Lavina Hamman, MD  Relative Name and Phone Number:       Current Level of Care: Hospital Recommended Level of Care: [place] Prior Approval Number:    Date Approved/Denied:   PASRR Number: [id number]  Discharge Plan: SNF    Current Diagnoses: Patient Active Problem List   Diagnosis Date Noted  . Chest pain 01/31/2020  . Acute encephalopathy 01/02/2020  . AMS (altered mental status) 01/01/2020  . Acute pulmonary edema (HCC)   . Recurrent falls 10/09/2019  . Generalized weakness 10/09/2019  . Unspecified atrial fibrillation ([place]) 10/08/2019  . Acute GI bleeding 10/05/2019  . Hyperkalemia   . Depression   . Esophageal stricture   . Diabetic gastroparesis ([place])   . Upper GI bleed 08/16/2019  . Elevated troponin 04/27/2019  . Hiatal hernia   . Iron deficiency anemia   . Microcytic anemia 02/05/2016  . Acute on chronic renal failure ([place])   . CAD in native artery   . Substance abuse ([place])   . Chronic combined systolic and diastolic CHF (congestive heart failure) ([place])   . ESRD (end stage renal disease) ([place])   . Obesity, Class III, BMI 40-49.9 (morbid obesity) ([place])   . Type 2 diabetes mellitus with chronic kidney disease on chronic dialysis, with long-term current use of insulin ([place]) 10/18/2014  . S/P CABG (coronary artery bypass graft) 09/21/2013  . Arthritis of right shoulder region 03/23/2013  . Tobacco abuse   . Coronary artery disease   . NSTEMI (non-ST elevated myocardial infarction) ([place]) 06/29/2012  . History of cocaine abuse ([place]) 06/29/2012   . Essential hypertension 06/29/2012  . Glaucoma   . Hyperlipidemia   . History of CVA (cerebrovascular accident)     Orientation RESPIRATION BLADDER Height & Weight     Self, Time, Situation, Place  Normal   Weight: 258 lb 13.1 oz (117.4 kg) Height:  5\' 8"  (172.7 cm)  BEHAVIORAL SYMPTOMS/MOOD NEUROLOGICAL BOWEL NUTRITION STATUS      Continent Diet(renal)  AMBULATORY STATUS COMMUNICATION OF NEEDS Skin   Limited Assist Verbally Normal                       Personal Care Assistance Level of Assistance  Bathing, Feeding, Dressing Bathing Assistance: Limited assistance Feeding assistance: Independent Dressing Assistance: Limited assistance     Functional Limitations Info  Sight Sight Info: Impaired        SPECIAL CARE FACTORS FREQUENCY  PT (By licensed PT), OT (By licensed OT)     PT Frequency: 5x/wk OT Frequency: 5x/wk            Contractures Contractures Info: Not present    Additional Factors Info  Code Status, Allergies, Psychotropic, Insulin Sliding Scale Code Status Info: Full Allergies Info: Naproxen Psychotropic Info: Wellbutrin SR 150mg  2x/day Insulin Sliding Scale Info: 0-6 units 3x/day with meals       Current Medications (02/04/2020):  This is the current hospital active medication list Current Facility-Administered Medications  Medication Dose Route Frequency Provider Last Rate Last Admin  . 0.9 %  sodium  chloride infusion (Manually program via Guardrails IV Fluids)   Intravenous Once Lang Snow, FNP      . 0.9 %  sodium chloride infusion   Intravenous Continuous Monia Sabal, PA-C      . acetaminophen (TYLENOL) tablet 650 mg  650 mg Oral Q6H PRN Fuller Plan A, MD   650 mg at 02/04/20 1121   Or  . acetaminophen (TYLENOL) suppository 650 mg  650 mg Rectal Q6H PRN Fuller Plan A, MD      . allopurinol (ZYLOPRIM) tablet 100 mg  100 mg Oral BID Tamala Julian, Rondell A, MD   100 mg at 02/04/20 1327  . aspirin EC tablet 325 mg  325 mg Oral Daily  Tamala Julian, Rondell A, MD   325 mg at 02/04/20 1327  . brinzolamide (AZOPT) 1 % ophthalmic suspension 1 drop  1 drop Both Eyes BID Fuller Plan A, MD   1 drop at 02/04/20 1329  . buPROPion (WELLBUTRIN SR) 12 hr tablet 150 mg  150 mg Oral BID Fuller Plan A, MD   150 mg at 02/04/20 1328  . cefTRIAXone (ROCEPHIN) 2 g in sodium chloride 0.9 % 100 mL IVPB  2 g Intravenous Q24H Smith, Rondell A, MD 200 mL/hr at 02/04/20 0002 2 g at 02/04/20 0002  . Chlorhexidine Gluconate Cloth 2 % PADS 6 each  6 each Topical Q0600 Norval Morton, MD   6 each at 02/03/20 [phone]  . Chlorhexidine Gluconate Cloth 2 % PADS 6 each  6 each Topical Q0600 Penninger, [place], [place]   6 each at 02/04/20 0534  . feeding supplement (NEPRO CARB STEADY) liquid 237 mL  237 mL Oral BID BM Matcha, Anupama, MD   237 mL at 02/04/20 1329  . ferrous sulfate tablet 325 mg  325 mg Oral BID WC Smith, Rondell A, MD   325 mg at 02/03/20 1616  . gabapentin (NEURONTIN) capsule 200 mg  200 mg Oral BID Lavina Hamman, MD   200 mg at 02/04/20 1327  . heparin injection 5,000 Units  5,000 Units Subcutaneous Q8H Matcha, Anupama, MD   5,000 Units at 02/04/20 1329  . insulin aspart (novoLOG) injection 0-6 Units  0-6 Units Subcutaneous TID WC Dessa Phi, DO   2 Units at 02/02/20 1713  . ipratropium-albuterol (DUONEB) 0.5-2.5 (3) MG/3ML nebulizer solution 3 mL  3 mL Nebulization Q6H PRN Smith, Rondell A, MD      . latanoprost (XALATAN) 0.005 % ophthalmic solution 1 drop  1 drop Both Eyes QHS Tamala Julian, Rondell A, MD   1 drop at 02/03/20 2213  . linaclotide (LINZESS) capsule 72 mcg  72 mcg Oral QAC breakfast Fuller Plan A, MD   72 mcg at 02/04/20 1328  . metoprolol tartrate (LOPRESSOR) tablet 25 mg  25 mg Oral BID Fuller Plan A, MD   25 mg at 02/04/20 1327  . mometasone-formoterol (DULERA) 100-5 MCG/ACT inhaler 2 puff  2 puff Inhalation BID Norval Morton, MD   2 puff at 02/03/20 2123  . multivitamin (RENA-VIT) tablet 1 tablet  1 tablet Oral QHS Matcha,  Anupama, MD   1 tablet at 02/03/20 2212  . nitroGLYCERIN (NITROSTAT) SL tablet 0.4 mg  0.4 mg Sublingual Q5 min PRN Fuller Plan A, MD      . ondansetron (ZOFRAN) tablet 4 mg  4 mg Oral Q6H PRN Fuller Plan A, MD       Or  . ondansetron (ZOFRAN) injection 4 mg  4 mg Intravenous Q6H PRN Norval Morton, MD      .  pantoprazole (PROTONIX) EC tablet 40 mg  40 mg Oral Daily Tamala Julian, Rondell A, MD   40 mg at 02/04/20 1327  . pravastatin (PRAVACHOL) tablet 40 mg  40 mg Oral Daily Tamala Julian, Rondell A, MD   40 mg at 02/04/20 1327  . sodium bicarbonate tablet 650 mg  650 mg Oral TID Norval Morton, MD   650 mg at 02/03/20 2212  . sodium chloride flush (NS) 0.9 % injection 3 mL  3 mL Intravenous Q12H Smith, Rondell A, MD   3 mL at 02/03/20 2214  . sucroferric oxyhydroxide (VELPHORO) chewable tablet 500 mg  500 mg Oral TID WC Smith, Rondell A, MD   500 mg at 02/04/20 1326     Discharge Medications: Please see discharge summary for a list of discharge medications.  Relevant Imaging Results:  Relevant Lab Results:   Additional Information SS#: [id number]; HD TTS at [facility], [place]

## 2020-02-04 NOTE — Progress Notes (Signed)
[facility] Progress Note  Patient: [name] [name]    WCB:[id number]  DOA: 01/31/2020     Date of Service: the patient was seen and examined on 02/04/2020  Chief Complaint  Patient presents with  . Chest Pain   Brief hospital course: [name] K [name] medical history significant ofCADs/pCABG, combined systolic and diastolic CHF, PVD, ESRD on HD(TTS), DM type II, hyperparathyroidism, asthma, polysubstance abuse, tobacco abuse.She presents with complaints of chest pain over the last 3 days. Pain is sharp and stabbing in nature located underneath her left breast. Symptoms have waxed and waned in intensity, but nothing has helped to relieve the pain. The chest pain does not radiate. She has had some associated shortness of breath. Patient was last dialyzed on 1/28. Since that time she has not been able to ambulate due to weakness and complaints of severe pain in her left great toe. She reports that the nailfell off approximately 2 weeks ago. Initially she thought symptoms were secondary to a gout flare. Due to her pain in her foot she has been more sedentary and not getting around as much. Patient had just recently been hospitalized last month for pulmonary edema. Currently further plan is continue pain control, await for angiogram.  Assessment and Plan: Left great toe diabetic foot ulcer PVD -X-ray:Soft tissue swelling, with ulceration of the first digit. No underlying radiographic evidence of osteomyelitis -CRP2.3, sed rate 60  -Blood cultures negative -ABI to rule out ischemia, unable to complete the test. Vascular surgery consulted. plan for angiogram on Monday -MRI showing focal area of ulceration in the first digit distal tuft, no sinus tract or abscess.  Reactive marrow in the distal tuft of the first digit without definite evidence of osteomyelitis. -Empiric vancomycin, Rocephin, metronidazole, now only on ceftriaxone for cellulitis.  MRSA PCR  negative  Chest pain, likely musculoskeletal in nature -Troponin0.22 -Chest pain improved per patient. No further work up  ESRD on hemodialysis -Nephrology following for dialysis  CAD, status post CABG 2013 -Continue aspirin, pravastatin  Hypertension -Continue Lopressor  Type 2 diabetes, insulin-dependent,well controlled -Hemoglobin A1c 5.0 in Jan 2021 -Sliding scale insulin  Hyperlipidemia -Continue pravastatin  Depression, anxiety -Continue Wellbutrin  GERD -Continue Protonix  Tobacco abuse -Cessation counseling, states that she currently smokes about 6 cigarettes daily, has been smoking since she was 69 yo  Anemia of CKD -Hemoglobin on admission 6.7.  Ordered FOBT. No bleeding So far stable after transfusion   Obesity Body mass index is 39.89 kg/m.  Dietary consult Nutrition Problem: Increased nutrient needs Etiology: wound healing Interventions: Interventions: Nepro shake, MVI  Diet: renal diet DVT Prophylaxis: Subcutaneous Heparin    Advance goals of care discussion: Full code  Family Communication: no family was present at bedside, at the time of interview.   Disposition:  Pt is from hom, admitted with dry gangrene, still has the same with pending work up, which precludes a safe discharge. Discharge to SNF, when work up completed.  Subjective: pain well controlled, no nausea or vomiting. No feer or chills   Physical Exam: General:  alert oriented to time, place, and person.  Appear in mild distress, affect appropriate Eyes: PERRL ENT: Oral Mucosa Clear, moist  Neck: no JVD,  Cardiovascular: S1 and S2 Present, no Murmur,  Respiratory: good respiratory effort, Bilateral Air entry equal and Decreased, no Crackles, no wheezes Abdomen: Bowel Sound present, Soft and no tenderness,  Skin: no rash, ulcer on left great toe Extremities: no Pedal edema, no calf tenderness  Neurologic: without any new focal findings  Gait not checked due to  patient safety concerns  Vitals:   02/04/20 0930 02/04/20 1000 02/04/20 1030 02/04/20 1100  BP: (!) 152/64 127/60 (!) 119/53 130/74  Pulse: 65 66 61 62  Resp:      Temp:      TempSrc:      SpO2:      Weight:      Height:        Intake/Output Summary (Last 24 hours) at 02/04/2020 1135 Last data filed at 02/04/2020 0516 Gross per 24 hour  Intake 830 ml  Output 350 ml  Net 480 ml   Filed Weights   02/02/20 2105 02/03/20 0500 02/04/20 0750  Weight: 119.2 kg 119.2 kg 119 kg    Data Reviewed: I have personally reviewed and interpreted daily labs, tele strips, imagings as discussed above. I reviewed all nursing notes, pharmacy notes, vitals, pertinent old records I have discussed plan of care as described above with RN and patient/family.  CBC: Recent Labs  Lab 01/31/20 1056 01/31/20 1056 02/01/20 0528 02/02/20 0400 02/02/20 1524 02/03/20 0415 02/04/20 0814  WBC 7.9  --  8.0 6.9  --  9.2 7.4  NEUTROABS 5.7  --   --   --   --   --   --   HGB 7.8*   < > 7.0* 6.7* 9.1* 9.2* 8.4*  HCT 27.1*   < > 25.0* 23.8* 29.9* 30.7* 28.6*  MCV 86.9  --  87.7 87.2  --  85.0 88.5  PLT 461*  --  377 358  --  353 324   < > = values in this interval not displayed.   Basic Metabolic Panel: Recent Labs  Lab 01/31/20 1056 02/01/20 0528 02/02/20 0400 02/03/20 0415 02/04/20 0814  NA 140 141 141 140 142  K 4.1 4.7 4.2 3.9 3.8  CL 101 101 103 100 101  CO2 23 25 24 26 25   GLUCOSE 93 112* 138* 115* 80  BUN 88* 40* 52* 37* 51*  CREATININE 8.57* 5.07* 6.68* 5.09* 6.57*  CALCIUM 8.0* 7.9* 8.0* 8.4* 8.2*  PHOS  --  4.4  --   --  6.1*    Studies: No results found.  Scheduled Meds: . sodium chloride   Intravenous Once  . acetaminophen      . allopurinol  100 mg Oral BID  . aspirin EC  325 mg Oral Daily  . brinzolamide  1 drop Both Eyes BID  . buPROPion  150 mg Oral BID  . Chlorhexidine Gluconate Cloth  6 each Topical Q0600  . Chlorhexidine Gluconate Cloth  6 each Topical Q0600  .  feeding supplement (NEPRO CARB STEADY)  237 mL Oral BID BM  . ferrous sulfate  325 mg Oral BID WC  . gabapentin  200 mg Oral BID  . heparin  5,000 Units Subcutaneous Q8H  . insulin aspart  0-6 Units Subcutaneous TID WC  . latanoprost  1 drop Both Eyes QHS  . linaclotide  72 mcg Oral QAC breakfast  . metoprolol tartrate  25 mg Oral BID  . mometasone-formoterol  2 puff Inhalation BID  . multivitamin  1 tablet Oral QHS  . pantoprazole  40 mg Oral Daily  . pravastatin  40 mg Oral Daily  . sodium bicarbonate  650 mg Oral TID  . sodium chloride flush  3 mL Intravenous Q12H  . sucroferric oxyhydroxide  500 mg Oral TID WC   Continuous Infusions: . sodium chloride    .  sodium chloride    . sodium chloride    . cefTRIAXone (ROCEPHIN)  IV 2 g (02/04/20 0002)  . ferric gluconate (FERRLECIT/NULECIT) IV 125 mg (02/04/20 1119)   PRN Meds: sodium chloride, sodium chloride, acetaminophen **OR** acetaminophen, alteplase, heparin, ipratropium-albuterol, lidocaine (PF), lidocaine-prilocaine, nitroGLYCERIN, ondansetron **OR** ondansetron (ZOFRAN) IV, pentafluoroprop-tetrafluoroeth  Time spent: 35 minutes  Author: Berle Mull, MD [facility] 02/04/2020 11:35 AM  To reach On-call, see care teams to locate the attending and reach out to them via [email or web address]. If 7PM-7AM, please contact night-coverage If you still have difficulty reaching the attending provider, please page the [facility] (Director on Call) for [facility] on amion for assistance.

## 2020-02-04 NOTE — Progress Notes (Signed)
[facility] Progress Note   Subjective:   Patient seen and examined on dialysis.  VVS consulting and plan angiogram on Monday. UF 1L today. Says makes good volume of urine.  Wt stable over past 3 days.  Objective Vitals:   02/03/20 1757 02/03/20 2127 02/04/20 0548 02/04/20 0622  BP: 91/75 (!) 95/53 (!) 91/32 (!) 127/59  Pulse: 76 78 74 79  Resp: 18 16 18    Temp: 98.7 F (37.1 C) 99.2 F (37.3 C) 98.2 F (36.8 C)   TempSrc:  Oral Oral   SpO2: 97% 99% 100%   Weight:      Height:       Physical Exam General:NAD, obese female Heart:RRR Lungs:CTAB, nml WOB Abdomen:soft, NTND Extremities:trace LE edema, L great toe w/dry eschar Dialysis Access: [facility]   Filed Weights   02/02/20 1358 02/02/20 2105 02/03/20 0500  Weight: 117.6 kg 119.2 kg 119.2 kg    Intake/Output Summary (Last 24 hours) at 02/04/2020 0913 Last data filed at 02/04/2020 0516 Gross per 24 hour  Intake 830 ml  Output 350 ml  Net 480 ml    Additional Objective Labs: Basic Metabolic Panel: Recent Labs  Lab 02/01/20 0528 02/01/20 0528 02/02/20 0400 02/03/20 0415 02/04/20 0814  NA 141   < > 141 140 142  K 4.7   < > 4.2 3.9 3.8  CL 101   < > 103 100 101  CO2 25   < > 24 26 25   GLUCOSE 112*   < > 138* 115* 80  BUN 40*   < > 52* 37* 51*  CREATININE 5.07*   < > 6.68* 5.09* 6.57*  CALCIUM 7.9*   < > 8.0* 8.4* 8.2*  PHOS 4.4  --   --   --  6.1*   < > = values in this interval not displayed.   Liver Function Tests: Recent Labs  Lab 01/31/20 1056 02/01/20 0528 02/04/20 0814  AST 12*  --   --   ALT 10  --   --   ALKPHOS 111  --   --   BILITOT 0.3  --   --   PROT 6.5  --   --   ALBUMIN 2.8* 2.5* 2.5*   Recent Labs  Lab 01/31/20 1056  LIPASE 26   CBC: Recent Labs  Lab 01/31/20 1056 01/31/20 1056 02/01/20 0528 02/01/20 0528 02/02/20 0400 02/02/20 0400 02/02/20 1524 02/03/20 0415 02/04/20 0814  WBC 7.9   < > 8.0   < > 6.9  --   --  9.2 7.4  NEUTROABS 5.7  --   --   --   --    --   --   --   --   HGB 7.8*   < > 7.0*   < > 6.7*   < > 9.1* 9.2* 8.4*  HCT 27.1*   < > 25.0*   < > 23.8*   < > 29.9* 30.7* 28.6*  MCV 86.9  --  87.7  --  87.2  --   --  85.0 88.5  PLT 461*   < > 377   < > 358  --   --  353 324   < > = values in this interval not displayed.   CBG: Recent Labs  Lab 02/03/20 0646 02/03/20 1125 02/03/20 1631 02/03/20 2128 02/04/20 0619  GLUCAP 107* 115* 129* 134* 76   Studies/Results: No results found.  Medications: . sodium chloride    . sodium chloride    .  sodium chloride    . cefTRIAXone (ROCEPHIN)  IV 2 g (02/04/20 0002)  . ferric gluconate (FERRLECIT/NULECIT) IV Stopped (02/02/20 1744)   . sodium chloride   Intravenous Once  . allopurinol  100 mg Oral BID  . aspirin EC  325 mg Oral Daily  . brinzolamide  1 drop Both Eyes BID  . buPROPion  150 mg Oral BID  . Chlorhexidine Gluconate Cloth  6 each Topical Q0600  . Chlorhexidine Gluconate Cloth  6 each Topical Q0600  . feeding supplement (NEPRO CARB STEADY)  237 mL Oral BID BM  . ferrous sulfate  325 mg Oral BID WC  . gabapentin  200 mg Oral BID  . heparin  5,000 Units Subcutaneous Q8H  . insulin aspart  0-6 Units Subcutaneous TID WC  . latanoprost  1 drop Both Eyes QHS  . linaclotide  72 mcg Oral QAC breakfast  . metoprolol tartrate  25 mg Oral BID  . mometasone-formoterol  2 puff Inhalation BID  . multivitamin  1 tablet Oral QHS  . pantoprazole  40 mg Oral Daily  . pravastatin  40 mg Oral Daily  . sodium bicarbonate  650 mg Oral TID  . sodium chloride flush  3 mL Intravenous Q12H  . sucroferric oxyhydroxide  500 mg Oral TID WC    Dialysis Orders: [facility] T,Th,S 4 hours 15 min 180NRe 400/800 118kg 2.0 K/ 2.0 Ca TDC/AVF -No heparin -Mircera 225 mcg IV q 2 weeks  ( LAST  ON 01/26/20) -Venofer 100 mg IVqtx through 2/11 -Calcitriol 1.34mcg PO TIW   Assessment/Plan: 1. Acute hypoxic respiratory failure 2/2 volume overload - Resolved with UF on HD.  2. ESRD - on HD TTS. HD today  per schedule.  3. Anemia of CKD- Hgb 8.4. this AM, s/p 2 unit pRBC on 2/4. ESA due 2/11. 4. Secondary hyperparathyroidism - Ca and phos ok. Continue binders, VDRA.  5. HTN/volume - Blood pressure variable.  Does not appear volume overloaded.  Plan for UF to dry.  6. Nutrition - Renal diet w/fluid restrictions 7. L toe diabetic ulcer - on Vanc, rocephin, metronidazole --> VVS consulting and plan angiogram Monday am. May need amputation. 8. Tobacco abuse - encouraged cessation 9. Debilitation 10. Hx CAD s/p CABG  Jannifer Hick MD [facility] Pager [phone]

## 2020-02-04 NOTE — Progress Notes (Signed)
OT Cancellation Note  Patient Details Name: [name] [name] MRN: [id number] DOB: February 21, 1951   Cancelled Treatment:    Reason Eval/Treat Not Completed: Patient at procedure or test/ unavailable;Other (comment)(then when in room ask not to be seen at this time) Will follow.  Joeseph Amor 02/04/2020, 1:20 PM

## 2020-02-05 LAB — CULTURE, BLOOD (ROUTINE X 2)
Culture: NO GROWTH
Culture: NO GROWTH

## 2020-02-05 LAB — GLUCOSE, CAPILLARY
Glucose-Capillary: 105 mg/dL — ABNORMAL HIGH (ref 70–99)
Glucose-Capillary: 89 mg/dL (ref 70–99)
Glucose-Capillary: 242 mg/dL — ABNORMAL HIGH (ref 70–99)
Glucose-Capillary: 317 mg/dL — ABNORMAL HIGH (ref 70–99)
Glucose-Capillary: 198 mg/dL — ABNORMAL HIGH (ref 70–99)
Glucose-Capillary: 165 mg/dL — ABNORMAL HIGH (ref 70–99)

## 2020-02-05 LAB — BLOOD GAS, ARTERIAL
Acid-Base Excess: 3.1 mmol/L — ABNORMAL HIGH (ref 0.0–2.0)
Bicarbonate: 27.2 mmol/L (ref 20.0–28.0)
Drawn by: 36529
FIO2: 21
O2 Saturation: 95.3 %
Patient temperature: 37.3
pCO2 arterial: 43 mmHg (ref 32.0–48.0)
pH, Arterial: 7.419 (ref 7.350–7.450)
pO2, Arterial: 85.1 mmHg (ref 83.0–108.0)

## 2020-02-05 MED ORDER — GABAPENTIN 600 MG PO TABS
300.0000 mg | ORAL_TABLET | Freq: Every day | ORAL | Status: DC
  Administered 2020-02-05 – 2020-02-09 (×5): 300 mg via ORAL
  Filled 2020-02-05 (×5): qty 1

## 2020-02-05 NOTE — Progress Notes (Signed)
[facility]  Subjective  - pt lethargic sleep unable to arouse not following commands   Objective (!) 101/46 73 99.1 F (37.3 C) (Axillary) 16 97%  Intake/Output Summary (Last 24 hours) at 02/05/2020 1113 Last data filed at 02/05/2020 1840 Gross per 24 hour  Intake 0 ml  Output 1363 ml  Net -1363 ml   Moans with deep sternal rub does not open eyes She is breathing without distress  Assessment/Planning: Pt very somnolent this morning not very responsive I discussed situation with pt primary nurse Plan for agram tomorrow if pt is stable  Ruta Hinds 02/05/2020 11:13 AM --  Laboratory Lab Results: Recent Labs    02/03/20 0415 02/04/20 0814  WBC 9.2 7.4  HGB 9.2* 8.4*  HCT 30.7* 28.6*  PLT 353 324   BMET Recent Labs    02/03/20 0415 02/04/20 0814  NA 140 142  K 3.9 3.8  CL 100 101  CO2 26 25  GLUCOSE 115* 80  BUN 37* 51*  CREATININE 5.09* 6.57*  CALCIUM 8.4* 8.2*    COAG Lab Results  Component Value Date   INR 1.2 08/25/2019   INR 1.2 04/28/2019   INR 1.17 09/23/2016   No results found for: PTT

## 2020-02-05 NOTE — Progress Notes (Signed)
[facility] Progress Note   Subjective:   Patient seen and examined at bedside.  Reports finally able to sleep.  Denies SOB, CP, weakness, dizziness, n/v/d, and abdominal pain.   Objective Vitals:   02/05/20 0524 02/05/20 0903 02/05/20 1015 02/05/20 1212  BP: (!) 152/66  (!) 101/46 (!) 110/58  Pulse: 70 70 73   Resp: 16 16    Temp: 99 F (37.2 C)  99.1 F (37.3 C)   TempSrc: Oral  Axillary   SpO2: 98% 97% 97%   Weight:      Height:       Physical Exam General:NAD, chronically ill appearing female Heart:RRR Lungs:CTAB, nml WOB Abdomen:soft, NTND Extremities:no LE edema, L great toe w/dry eschar Dialysis Access: [facility]   Filed Weights   02/04/20 1204 02/04/20 2230 02/05/20 0500  Weight: 117.4 kg 119.4 kg 119.4 kg    Intake/Output Summary (Last 24 hours) at 02/05/2020 1314 Last data filed at 02/05/2020 1238 Gross per 24 hour  Intake 520 ml  Output 350 ml  Net 170 ml    Additional Objective Labs: Basic Metabolic Panel: Recent Labs  Lab 02/01/20 0528 02/01/20 0528 02/02/20 0400 02/03/20 0415 02/04/20 0814  NA 141   < > 141 140 142  K 4.7   < > 4.2 3.9 3.8  CL 101   < > 103 100 101  CO2 25   < > 24 26 25   GLUCOSE 112*   < > 138* 115* 80  BUN 40*   < > 52* 37* 51*  CREATININE 5.07*   < > 6.68* 5.09* 6.57*  CALCIUM 7.9*   < > 8.0* 8.4* 8.2*  PHOS 4.4  --   --   --  6.1*   < > = values in this interval not displayed.   Liver Function Tests: Recent Labs  Lab 01/31/20 1056 02/01/20 0528 02/04/20 0814  AST 12*  --   --   ALT 10  --   --   ALKPHOS 111  --   --   BILITOT 0.3  --   --   PROT 6.5  --   --   ALBUMIN 2.8* 2.5* 2.5*   Recent Labs  Lab 01/31/20 1056  LIPASE 26   CBC: Recent Labs  Lab 01/31/20 1056 01/31/20 1056 02/01/20 0528 02/01/20 0528 02/02/20 0400 02/02/20 0400 02/02/20 1524 02/03/20 0415 02/04/20 0814  WBC 7.9   < > 8.0   < > 6.9  --   --  9.2 7.4  NEUTROABS 5.7  --   --   --   --   --   --   --   --   HGB 7.8*    < > 7.0*   < > 6.7*   < > 9.1* 9.2* 8.4*  HCT 27.1*   < > 25.0*   < > 23.8*   < > 29.9* 30.7* 28.6*  MCV 86.9  --  87.7  --  87.2  --   --  85.0 88.5  PLT 461*   < > 377   < > 358  --   --  353 324   < > = values in this interval not displayed.   Blood Culture    Component Value Date/Time   SDES BLOOD RIGHT ANTECUBITAL 01/31/2020 1158   SPECREQUEST  01/31/2020 1158    BOTTLES DRAWN AEROBIC AND ANAEROBIC Blood Culture results may not be optimal due to an inadequate volume of blood received in culture bottles  CULT  01/31/2020 1158    NO GROWTH 5 DAYS Performed at [facility], [place] [address] [place], [place] [address]    REPTSTATUS 02/05/2020 FINAL 01/31/2020 1158    CBG: Recent Labs  Lab 02/04/20 2115 02/05/20 0641 02/05/20 0709 02/05/20 1021 02/05/20 1117  GLUCAP 128* 89 105* 242* 317*   Studies/Results: No results found.  Medications: . sodium chloride    . cefTRIAXone (ROCEPHIN)  IV 200 mL/hr at 02/05/20 0250   . sodium chloride   Intravenous Once  . allopurinol  100 mg Oral BID  . aspirin EC  325 mg Oral Daily  . brinzolamide  1 drop Both Eyes BID  . Chlorhexidine Gluconate Cloth  6 each Topical Q0600  . Chlorhexidine Gluconate Cloth  6 each Topical Q0600  . feeding supplement (NEPRO CARB STEADY)  237 mL Oral BID BM  . ferrous sulfate  325 mg Oral BID WC  . heparin  5,000 Units Subcutaneous Q8H  . insulin aspart  0-6 Units Subcutaneous TID WC  . latanoprost  1 drop Both Eyes QHS  . linaclotide  72 mcg Oral QAC breakfast  . metoprolol tartrate  25 mg Oral BID  . mometasone-formoterol  2 puff Inhalation BID  . multivitamin  1 tablet Oral QHS  . pantoprazole  40 mg Oral Daily  . pravastatin  40 mg Oral Daily  . sodium bicarbonate  650 mg Oral TID  . sodium chloride flush  3 mL Intravenous Q12H  . sucroferric oxyhydroxide  500 mg Oral TID WC    Dialysis Orders: [facility] T,Th,S 4 hours 15 min 180NRe 400/800 118kg 2.0 K/ 2.0 Ca TDC/AVF -No  heparin -Mircera 225 mcg IV q 2 weeks( LAST ON 01/26/20) -Venofer 100 mg IVqtx through 2/11 -Calcitriol 1.71mcg PO TIW   Assessment/Plan: 1. Acute hypoxic respiratory failure 2/2 volume overload - Resolved with UF on HD.  2. ESRD - on HD TTS. Next HD on 2/9. K 3.8. Stopped PO bicarb. 3. Anemia of CKD- Hgb 8.4. this AM, s/p 2 unit pRBC on 2/4. ESA due 2/11. Check Fe studies. Given 600mg  IV iron. Stopped PO iron. 4. Secondary hyperparathyroidism - Ca at goal, phos elevated, reinforce diet. Continue binders, VDRA.  5. HTN/volume - Blood pressure well controlled today.  Does not appear volume overloaded.   6. Nutrition - Renal diet w/fluid restrictions 7. L toe diabetic ulcer - MRI w/definite evidence of osteomyelitis. Now only on ceftriaxone--> VVS consulting and plan angiogram Monday am as long as patient stable. May need amputation. 8. Tobacco abuse - encouraged cessation 9. Debilitation 10. Hx CAD s/p CABG  Jen Mow, PA-C [facility] Pager: [phone] 02/05/2020,1:14 PM  LOS: 5 days

## 2020-02-05 NOTE — TOC Initial Note (Signed)
Transition of Care [facility]) - Initial/Assessment Note    Patient Details  Name: [name] [name] MRN: [id number] Date of Birth: [birth year]  Transition [facility] CM/SW Contact:    Gelene Mink, [place] Phone Number: 02/05/2020, 12:30 PM  Clinical Narrative:         CSW called and spoke with the patient. CSW introduced herself, explained her role, and shared therapy recommendation. Patient is agreeable to SNF. She has been to [facility]. She would like to return as an option. CSW obtained permission to give her bed offers.   Currently the patient doesn't have any bed offers. CSW will continue to follow and assist with disposition planning.            Expected Discharge Plan: Skilled Nursing Facility Barriers to Discharge: Continued Medical Work up   Patient Goals and CMS Choice Patient states their goals for this hospitalization and ongoing recovery are:: Pt is agreeable to SNF CMS Medicare.gov Compare Post Acute Care list provided to:: Patient Choice offered to / list presented to : Patient  Expected Discharge Plan and Services Expected Discharge Plan: [place] In-house Referral: Clinical Social Work Discharge Planning Services: NA Post Acute Care Choice: [place] Living arrangements for the past 2 months: Single Family Home                 DME Arranged: N/A DME Agency: NA       HH Arranged: NA [place] Agency: NA        Prior Living Arrangements/Services Living arrangements for the past 2 months: [place] with:: Self Patient language and need for interpreter reviewed:: No Do you feel safe going back to the place where you live?: Yes      Need for Family Participation in Patient Care: Yes (Comment) Care giver support system in place?: Yes (comment)   Criminal Activity/Legal Involvement Pertinent to Current Situation/Hospitalization: No - Comment as needed  Activities of Daily Living Home Assistive Devices/Equipment: Cane  (specify quad or straight), Shower chair with back ADL Screening (condition at time of admission) Patient's cognitive ability adequate to safely complete daily activities?: Yes Is the patient deaf or have difficulty hearing?: No Does the patient have difficulty seeing, even when wearing glasses/contacts?: No Does the patient have difficulty concentrating, remembering, or making decisions?: No Patient able to express need for assistance with ADLs?: Yes Does the patient have difficulty dressing or bathing?: Yes Independently performs ADLs?: Yes (appropriate for developmental age) Does the patient have difficulty walking or climbing stairs?: Yes Weakness of Legs: Both Weakness of Arms/Hands: None  Permission Sought/Granted Permission sought to share information with : Case Manager Permission granted to share information with : Yes, Verbal Permission Granted     Permission granted to share info w AGENCY: All SNF        Emotional Assessment Appearance:: Appears stated age Attitude/Demeanor/Rapport: Engaged Affect (typically observed): Calm Orientation: : Oriented to Self, Oriented to Place, Oriented to  Time, Oriented to Situation Alcohol / Substance Use: Not Applicable Psych Involvement: No (comment)  Admission diagnosis:  Shortness of breath [R06.02] Precordial pain [R07.2] Hypoxia [R09.02] Chest pain [R07.9] Patient Active Problem List   Diagnosis Date Noted  . Chest pain 01/31/2020  . Acute encephalopathy 01/02/2020  . AMS (altered mental status) 01/01/2020  . Acute pulmonary edema (HCC)   . Recurrent falls 10/09/2019  . Generalized weakness 10/09/2019  . Unspecified atrial fibrillation ([place]) 10/08/2019  . Acute GI bleeding 10/05/2019  .  Hyperkalemia   . Depression   . Esophageal stricture   . Diabetic gastroparesis ([place])   . Upper GI bleed 08/16/2019  . Elevated troponin 04/27/2019  . Hiatal hernia   . Iron deficiency anemia   . Microcytic anemia 02/05/2016  .  Acute on chronic renal failure ([place])   . CAD in native artery   . Substance abuse ([place])   . Chronic combined systolic and diastolic CHF (congestive heart failure) ([place])   . ESRD (end stage renal disease) ([place])   . Obesity, Class III, BMI 40-49.9 (morbid obesity) ([place])   . Type 2 diabetes mellitus with chronic kidney disease on chronic dialysis, with long-term current use of insulin ([place]) 10/18/2014  . S/P CABG (coronary artery bypass graft) 09/21/2013  . Arthritis of right shoulder region 03/23/2013  . Tobacco abuse   . Coronary artery disease   . NSTEMI (non-ST elevated myocardial infarction) ([place]) 06/29/2012  . History of cocaine abuse ([place]) 06/29/2012  . Essential hypertension 06/29/2012  . Glaucoma   . Hyperlipidemia   . History of CVA (cerebrovascular accident)    PCP:  [facility] Pharmacy:   [facility] [address] [place] [address] Phone: [phone] Fax: [place], [place] - [address] [place] [address] Phone: [phone] Fax: [phone]  [facility] [place], [place] [address] [place] [address] Phone: [phone] Fax: [phone]     Social Determinants of Health (SDOH) Interventions    Readmission Risk Interventions Readmission Risk Prevention Plan 12/12/2019 10/08/2019 08/26/2019  Transportation Screening Complete - Complete  Medication Review (RN Care Manager) Complete - Complete  PCP or Specialist appointment within 3-5 days of discharge Complete - Complete  [place] or [place] - - Complete  SW Recovery Care/Counseling Consult - - Complete  [place] - - Not Applicable  Some recent data might be hidden

## 2020-02-05 NOTE — Progress Notes (Addendum)
[facility] Progress Note  Patient: [name] [name]    IWL:[id number]  DOA: 01/31/2020     Date of Service: the patient was seen and examined on 02/05/2020  Chief Complaint  Patient presents with  . Chest Pain   Brief hospital course: [name] K [name] medical history significant ofCADs/pCABG, combined systolic and diastolic CHF, PVD, ESRD on HD(TTS), DM type II, hyperparathyroidism, asthma, polysubstance abuse, tobacco abuse.She presents with complaints of chest pain over the last 3 days. Pain is sharp and stabbing in nature located underneath her left breast. Symptoms have waxed and waned in intensity, but nothing has helped to relieve the pain. The chest pain does not radiate. She has had some associated shortness of breath. Patient was last dialyzed on 1/28. Since that time she has not been able to ambulate due to weakness and complaints of severe pain in her left great toe. She reports that the nailfell off approximately 2 weeks ago. Initially she thought symptoms were secondary to a gout flare. Due to her pain in her foot she has been more sedentary and not getting around as much. Patient had just recently been hospitalized last month for pulmonary edema. Currently further plan is continue pain control, await for angiogram.  Assessment and Plan: Left great toe diabetic foot ulcer PVD -X-ray:Soft tissue swelling, with ulceration of the first digit. No underlying radiographic evidence of osteomyelitis -CRP2.3, sed rate 60  -Blood cultures negative -ABI to rule out ischemia, unable to complete the test. Vascular surgery consulted. plan for angiogram on Monday -MRI showing focal area of ulceration in the first digit distal tuft, no sinus tract or abscess.  Reactive marrow in the distal tuft of the first digit without definite evidence of osteomyelitis. -Empiric vancomycin, Rocephin, metronidazole, now only on ceftriaxone for cellulitis.  MRSA PCR  negative  Acute toxic encephalopathy. Likely from gabapentin dose increase. We reduced the gabapentin back to 300 mg nightly. ABG unremarkable.  CBC unremarkable. No focal deficit  Chest pain, likely musculoskeletal in nature -Troponin0.22 -Chest pain improved per patient. No further work up  ESRD on hemodialysis -Nephrology following for dialysis  CAD, status post CABG 2013 -Continue aspirin, pravastatin  Hypertension -Continue Lopressor  Type 2 diabetes, insulin-dependent,well controlled -Hemoglobin A1c 5.0 in Jan 2021 -Sliding scale insulin  Hyperlipidemia -Continue pravastatin  Depression, anxiety -Continue Wellbutrin  GERD -Continue Protonix  Tobacco abuse -Cessation counseling, states that she currently smokes about 6 cigarettes daily, has been smoking since she was 69 yo  Anemia of CKD -Hemoglobin on admission 6.7.  Ordered FOBT. No bleeding So far stable after transfusion   Obesity Body mass index is 40.02 kg/m.  Dietary consult Nutrition Problem: Increased nutrient needs Etiology: wound healing Interventions: Interventions: Nepro shake, MVI  Diet: renal diet DVT Prophylaxis: Subcutaneous Heparin    Advance goals of care discussion: Full code  Family Communication: no family was present at bedside, at the time of interview.   Disposition:  Pt is from hom, admitted with dry gangrene, still has the same with pending work up, which precludes a safe discharge. Discharge to SNF, when work up completed.  Subjective: Lethargic this morning.  No acute complaint no acute events.  Physical Exam: General: lethargic and not oriented to time, place, and person. Appear in mild distress, affect unresponsive Eyes: PERRL, Conjunctiva normal ENT: Oral Mucosa Clear, moist  Neck: difficult to assess  JVD, no Abnormal Mass Or lumps Cardiovascular: S1 and S2 Present, no Murmur, peripheral pulses symmetrical Respiratory: good respiratory  effort,  Bilateral Air entry equal and Decreased, no signs of accessory muscle use, Clear to Auscultation, no Crackles, no wheezes Abdomen: Bowel Sound present, Soft and no tenderness, no hernia Skin: no rashes  Extremities: no Pedal edema, no calf tenderness Neurologic: without any new focal findings Gait not checked due to patient safety concerns   Vitals:   02/05/20 0903 02/05/20 1015 02/05/20 1212 02/05/20 1759  BP:  (!) 101/46 (!) 110/58 117/65  Pulse: 70 73  75  Resp: 16   18  Temp:  99.1 F (37.3 C)  98 F (36.7 C)  TempSrc:  Axillary  Oral  SpO2: 97% 97%  98%  Weight:      Height:        Intake/Output Summary (Last 24 hours) at 02/05/2020 1823 Last data filed at 02/05/2020 1731 Gross per 24 hour  Intake 880 ml  Output 575 ml  Net 305 ml   Filed Weights   02/04/20 1204 02/04/20 2230 02/05/20 0500  Weight: 117.4 kg 119.4 kg 119.4 kg    Data Reviewed: I have personally reviewed and interpreted daily labs, tele strips, imagings as discussed above. I reviewed all nursing notes, pharmacy notes, vitals, pertinent old records I have discussed plan of care as described above with RN and patient/family.  CBC: Recent Labs  Lab 01/31/20 1056 01/31/20 1056 02/01/20 0528 02/02/20 0400 02/02/20 1524 02/03/20 0415 02/04/20 0814  WBC 7.9  --  8.0 6.9  --  9.2 7.4  NEUTROABS 5.7  --   --   --   --   --   --   HGB 7.8*   < > 7.0* 6.7* 9.1* 9.2* 8.4*  HCT 27.1*   < > 25.0* 23.8* 29.9* 30.7* 28.6*  MCV 86.9  --  87.7 87.2  --  85.0 88.5  PLT 461*  --  377 358  --  353 324   < > = values in this interval not displayed.   Basic Metabolic Panel: Recent Labs  Lab 01/31/20 1056 02/01/20 0528 02/02/20 0400 02/03/20 0415 02/04/20 0814  NA 140 141 141 140 142  K 4.1 4.7 4.2 3.9 3.8  CL 101 101 103 100 101  CO2 23 25 24 26 25   GLUCOSE 93 112* 138* 115* 80  BUN 88* 40* 52* 37* 51*  CREATININE 8.57* 5.07* 6.68* 5.09* 6.57*  CALCIUM 8.0* 7.9* 8.0* 8.4* 8.2*  PHOS  --  4.4  --   --   6.1*    Studies: No results found.  Scheduled Meds: . sodium chloride   Intravenous Once  . allopurinol  100 mg Oral BID  . aspirin EC  325 mg Oral Daily  . brinzolamide  1 drop Both Eyes BID  . Chlorhexidine Gluconate Cloth  6 each Topical Q0600  . Chlorhexidine Gluconate Cloth  6 each Topical Q0600  . feeding supplement (NEPRO CARB STEADY)  237 mL Oral BID BM  . gabapentin  300 mg Oral QHS  . heparin  5,000 Units Subcutaneous Q8H  . insulin aspart  0-6 Units Subcutaneous TID WC  . latanoprost  1 drop Both Eyes QHS  . linaclotide  72 mcg Oral QAC breakfast  . metoprolol tartrate  25 mg Oral BID  . mometasone-formoterol  2 puff Inhalation BID  . multivitamin  1 tablet Oral QHS  . pantoprazole  40 mg Oral Daily  . pravastatin  40 mg Oral Daily  . sodium chloride flush  3 mL Intravenous Q12H  . sucroferric oxyhydroxide  500 mg Oral TID WC   Continuous Infusions: . sodium chloride    . cefTRIAXone (ROCEPHIN)  IV 200 mL/hr at 02/05/20 0250   PRN Meds: acetaminophen **OR** acetaminophen, ipratropium-albuterol, nitroGLYCERIN, ondansetron **OR** ondansetron (ZOFRAN) IV  Time spent: 35 minutes  Author: Berle Mull, MD [facility] 02/05/2020 6:23 PM  To reach On-call, see care teams to locate the attending and reach out to them via [email or web address]. If 7PM-7AM, please contact night-coverage If you still have difficulty reaching the attending provider, please page the [facility] (Director on Call) for [facility] on amion for assistance.

## 2020-02-05 NOTE — Evaluation (Signed)
Occupational Therapy Evaluation Patient Details Name: [name] [name] MRN: [id number] DOB: [birth year] Today's Date: 02/05/2020    History of Present Illness [name] [name] is a 69 y.o. female with medical history significant of CAD s/p CABG, combined systolic and diastolic CHF, PVD, ESRD on HD (TTS), DM type II, hyperparathyroidism, asthma, polysubstance abuse, tobacco abuse. She presents with complaints of chest pain over the last 3 days, likely musculoskeletal in nature. Also with left great toe diabetic foot ulcer; pending MRI to rule out osteomyelitis.   Clinical Impression   PTA pt living at home with increasing difficulty for independent BADL and increased number of falls. She states that EMS had to be called each time to have help up from the fall. At time of evaluation, pt is mod I for bed mobility and min A-min guard for sit <> stand transfers. She currently requires max A for LB dressing, difficulty managing BLEs. Darco shoe donned for session with added support noted from pt. Pt completed functional mobility a household distance at min guard level. Noted increased fatigue after this. Given current status, recommend SNF at d/c for continued BADL training. Will continue to follow per POC listed below.     Follow Up Recommendations  SNF    Equipment Recommendations  3 in 1 bedside commode    Recommendations for Other Services       Precautions / Restrictions Precautions Precautions: Fall Required Braces or Orthoses: Other Brace Other Brace: darco shoe Restrictions Weight Bearing Restrictions: No Other Position/Activity Restrictions: no active WB orders per chart review, but encouraged more heel WB with darco shoe for pt comfort with functional mobility      Mobility Bed Mobility Overal bed mobility: Modified Independent                Transfers Overall transfer level: Needs assistance Equipment used: Rolling walker (2 wheeled)   Sit to Stand: Min guard          General transfer comment: min guard for close safety    Balance Overall balance assessment: Needs assistance Sitting-balance support: Feet supported Sitting balance-Leahy Scale: Good     Standing balance support: Bilateral upper extremity supported;During functional activity Standing balance-Leahy Scale: Fair Standing balance comment: reliant on external support                           ADL either performed or assessed with clinical judgement   ADL Overall ADL's : Needs assistance/impaired Eating/Feeding: Set up;Sitting   Grooming: Set up;Sitting   Upper Body Bathing: Set up;Sitting   Lower Body Bathing: Maximal assistance;Sit to/from stand;Sitting/lateral leans   Upper Body Dressing : Set up;Sitting   Lower Body Dressing: Maximal assistance;Sitting/lateral leans;Sit to/from stand Lower Body Dressing Details (indicate cue type and reason): to don RLE sock and LLE darco shoe Toilet Transfer: Min guard;Minimal assistance;Comfort height toilet;Grab bars;RW Armed forces technical officer Details (indicate cue type and reason): requires grab bar or firm surface for additional support Toileting- Clothing Manipulation and Hygiene: Minimal assistance;Sit to/from stand   Tub/ Shower Transfer: Minimal assistance;Ambulation;Shower seat;Rolling walker   Functional mobility during ADLs: Min guard;Rolling walker General ADL Comments: pt limited by L great toe pain and overall deconditioning for BADL routines     Vision Baseline Vision/History: Wears glasses Wears Glasses: At all times Patient Visual Report: No change from baseline Vision Assessment?: No apparent visual deficits     Perception     Praxis      Pertinent  Vitals/Pain Pain Assessment: Faces Faces Pain Scale: Hurts little more Pain Location: left first toe Pain Descriptors / Indicators: Discomfort;Grimacing;Guarding Pain Intervention(s): Limited activity within patient's tolerance;Monitored during session;Repositioned      Hand Dominance     Extremity/Trunk Assessment Upper Extremity Assessment Upper Extremity Assessment: Generalized weakness   Lower Extremity Assessment Lower Extremity Assessment: Defer to PT evaluation       Communication Communication Communication: No difficulties   Cognition Arousal/Alertness: Awake/alert Behavior During Therapy: WFL for tasks assessed/performed Overall Cognitive Status: Within Functional Limits for tasks assessed                                     General Comments       Exercises     Shoulder Instructions      Home Living Family/patient expects to be discharged to:: Private residence Living Arrangements: Spouse/significant other Available Help at Discharge: Available 24 hours/day;Family Type of Home: Apartment Home Access: Stairs to enter [employer] of Steps: 13 (2 sets of 6) Entrance Stairs-Rails: Can reach both Home Layout: One level     Bathroom Shower/Tub: Teacher, early years/pre: Standard Bathroom Accessibility: Yes   Home Equipment: Shower seat;Grab bars - tub/shower;Cane - quad   Additional Comments: 14 steps to enter home      Prior Functioning/Environment Level of Independence: Needs assistance  Gait / Transfers Assistance Needed: ambulatory with quad cane, recent falls ADL's / Homemaking Assistance Needed: requires assist with IADL's, increasing difficulty with ADL's, getting out of bed, off of toilet            OT Problem List: Decreased strength;Decreased knowledge of use of DME or AE;Decreased knowledge of precautions;Obesity;Decreased activity tolerance;Impaired balance (sitting and/or standing);Pain      OT Treatment/Interventions: Self-care/ADL training;Therapeutic exercise;Patient/family education;Balance training;Energy conservation;Therapeutic activities;DME and/or AE instruction    OT Goals(Current goals can be found in the care plan section) Acute Rehab OT  Goals Patient Stated Goal: "go to rehab." OT Goal Formulation: With patient Time For Goal Achievement: 02/19/20 Potential to Achieve Goals: Good  OT Frequency: Min 2X/week   Barriers to D/C:            Co-evaluation              AM-PAC OT "6 Clicks" Daily Activity     Outcome Measure Help from another person eating meals?: None Help from another person taking care of personal grooming?: None Help from another person toileting, which includes using toliet, bedpan, or urinal?: A Little Help from another person bathing (including washing, rinsing, drying)?: A Lot Help from another person to put on and taking off regular upper body clothing?: None Help from another person to put on and taking off regular lower body clothing?: A Lot 6 Click Score: 19   End of Session Equipment Utilized During Treatment: Gait belt;Rolling walker Nurse Communication: Mobility status  Activity Tolerance: Patient tolerated treatment well Patient left: with call bell/phone within reach;in chair  OT Visit Diagnosis: Unsteadiness on feet (R26.81);Other abnormalities of gait and mobility (R26.89);Muscle weakness (generalized) (M62.81);Pain Pain - Right/Left: Left Pain - part of body: Ankle and joints of foot                Time: 9323-5573 OT Time Calculation (min): 20 min Charges:  OT General Charges $OT Visit: 1 Visit OT Evaluation $OT Eval Moderate Complexity: 1 Mod  Zenovia Jarred, MSOT, OTR/L Acute Rehabilitation  Services [facility] Office Number: [phone]  Zenovia Jarred 02/05/2020, 5:45 PM

## 2020-02-06 ENCOUNTER — Encounter (HOSPITAL_COMMUNITY)
Admission: EM | Disposition: A | Discharge status: Skilled Nursing Facility | Payer: Self-pay | Source: Home / Self Care | Attending: Internal Medicine

## 2020-02-06 DIAGNOSIS — E1152 Type 2 diabetes mellitus with diabetic peripheral angiopathy with gangrene: Principal | ICD-10-CM

## 2020-02-06 DIAGNOSIS — E440 Moderate protein-calorie malnutrition: Secondary | ICD-10-CM

## 2020-02-06 HISTORY — PX: PERIPHERAL VASCULAR INTERVENTION: CATH118257

## 2020-02-06 HISTORY — PX: ABDOMINAL AORTOGRAM W/LOWER EXTREMITY: CATH118223

## 2020-02-06 HISTORY — PX: PERIPHERAL VASCULAR ATHERECTOMY: CATH118256

## 2020-02-06 LAB — COMPREHENSIVE METABOLIC PANEL
ALT: 12 U/L (ref 0–44)
AST: 20 U/L (ref 15–41)
Albumin: 2.7 g/dL — ABNORMAL LOW (ref 3.5–5.0)
Alkaline Phosphatase: 98 U/L (ref 38–126)
Anion gap: 15 (ref 5–15)
BUN: 47 mg/dL — ABNORMAL HIGH (ref 8–23)
CO2: 22 mmol/L (ref 22–32)
Calcium: 8 mg/dL — ABNORMAL LOW (ref 8.9–10.3)
Chloride: 104 mmol/L (ref 98–111)
Creatinine, Ser: 5.87 mg/dL — ABNORMAL HIGH (ref 0.44–1.00)
GFR calc Af Amer: 8 mL/min — ABNORMAL LOW (ref >60)
GFR calc non Af Amer: 7 mL/min — ABNORMAL LOW (ref >60)
Glucose, Bld: 138 mg/dL — ABNORMAL HIGH (ref 70–99)
Potassium: 3.9 mmol/L (ref 3.5–5.1)
Sodium: 141 mmol/L (ref 135–145)
Total Bilirubin: 0.3 mg/dL (ref 0.3–1.2)
Total Protein: 6.1 g/dL — ABNORMAL LOW (ref 6.5–8.1)

## 2020-02-06 LAB — CBC WITH DIFFERENTIAL/PLATELET
Abs Immature Granulocytes: 0.07 10*3/uL (ref 0.00–0.07)
Basophils Absolute: 0 10*3/uL (ref 0.0–0.1)
Basophils Relative: 1 %
Eosinophils Absolute: 0.2 10*3/uL (ref 0.0–0.5)
Eosinophils Relative: 3 %
HCT: 32.8 % — ABNORMAL LOW (ref 36.0–46.0)
Hemoglobin: 9.5 g/dL — ABNORMAL LOW (ref 12.0–15.0)
Immature Granulocytes: 1 %
Lymphocytes Relative: 19 %
Lymphs Abs: 1.4 10*3/uL (ref 0.7–4.0)
MCH: 26.2 pg (ref 26.0–34.0)
MCHC: 29 g/dL — ABNORMAL LOW (ref 30.0–36.0)
MCV: 90.4 fL (ref 80.0–100.0)
Monocytes Absolute: 0.8 10*3/uL (ref 0.1–1.0)
Monocytes Relative: 11 %
Neutro Abs: 4.9 10*3/uL (ref 1.7–7.7)
Neutrophils Relative %: 65 %
Platelets: 297 10*3/uL (ref 150–400)
RBC: 3.63 MIL/uL — ABNORMAL LOW (ref 3.87–5.11)
RDW: 21.1 % — ABNORMAL HIGH (ref 11.5–15.5)
WBC: 7.5 10*3/uL (ref 4.0–10.5)
nRBC: 0.3 % — ABNORMAL HIGH (ref 0.0–0.2)

## 2020-02-06 LAB — GLUCOSE, CAPILLARY
Glucose-Capillary: 107 mg/dL — ABNORMAL HIGH (ref 70–99)
Glucose-Capillary: 181 mg/dL — ABNORMAL HIGH (ref 70–99)
Glucose-Capillary: 144 mg/dL — ABNORMAL HIGH (ref 70–99)
Glucose-Capillary: 155 mg/dL — ABNORMAL HIGH (ref 70–99)

## 2020-02-06 LAB — IRON AND TIBC
Iron: 50 ug/dL (ref 28–170)
Saturation Ratios: 24 % (ref 10.4–31.8)
TIBC: 209 ug/dL — ABNORMAL LOW (ref 250–450)
UIBC: 159 ug/dL

## 2020-02-06 LAB — MAGNESIUM: Magnesium: 1.6 mg/dL — ABNORMAL LOW (ref 1.7–2.4)

## 2020-02-06 LAB — FERRITIN: Ferritin: 242 ng/mL (ref 11–307)

## 2020-02-06 SURGERY — ABDOMINAL AORTOGRAM W/LOWER EXTREMITY
Anesthesia: LOCAL | Laterality: Left

## 2020-02-06 MED ORDER — LIDOCAINE HCL (PF) 1 % IJ SOLN
INTRAMUSCULAR | Status: DC | PRN
  Administered 2020-02-06: 18 mL

## 2020-02-06 MED ORDER — FENTANYL CITRATE (PF) 100 MCG/2ML IJ SOLN
INTRAMUSCULAR | Status: AC
  Filled 2020-02-06: qty 2

## 2020-02-06 MED ORDER — CLOPIDOGREL BISULFATE 300 MG PO TABS
ORAL_TABLET | ORAL | Status: DC | PRN
  Administered 2020-02-06: 300 mg via ORAL

## 2020-02-06 MED ORDER — SODIUM CHLORIDE 0.9 % IV SOLN: INTRAVENOUS | Status: DC

## 2020-02-06 MED ORDER — MIDAZOLAM HCL 2 MG/2ML IJ SOLN
INTRAMUSCULAR | Status: AC
  Filled 2020-02-06: qty 2

## 2020-02-06 MED ORDER — FENTANYL CITRATE (PF) 100 MCG/2ML IJ SOLN
INTRAMUSCULAR | Status: DC | PRN
  Administered 2020-02-06 (×2): 25 ug via INTRAVENOUS

## 2020-02-06 MED ORDER — HEPARIN (PORCINE) IN NACL 1000-0.9 UT/500ML-% IV SOLN
INTRAVENOUS | Status: DC | PRN
  Administered 2020-02-06 (×2): 500 mL

## 2020-02-06 MED ORDER — SODIUM CHLORIDE 0.9 % IV SOLN: 250.0000 mL | INTRAVENOUS | Status: DC | PRN

## 2020-02-06 MED ORDER — CLOPIDOGREL BISULFATE 75 MG PO TABS: 300.0000 mg | ORAL_TABLET | Freq: Once | ORAL | Status: DC

## 2020-02-06 MED ORDER — LABETALOL HCL 5 MG/ML IV SOLN: 10.0000 mg | INTRAVENOUS | Status: DC | PRN

## 2020-02-06 MED ORDER — HEPARIN SODIUM (PORCINE) 1000 UNIT/ML IJ SOLN
INTRAMUSCULAR | Status: AC
  Filled 2020-02-06: qty 1

## 2020-02-06 MED ORDER — HEPARIN SODIUM (PORCINE) 1000 UNIT/ML IJ SOLN
INTRAMUSCULAR | Status: DC | PRN
  Administered 2020-02-06: 12000 [IU] via INTRAVENOUS

## 2020-02-06 MED ORDER — SODIUM CHLORIDE 0.9% FLUSH: 3.0000 mL | INTRAVENOUS | Status: DC | PRN

## 2020-02-06 MED ORDER — SODIUM CHLORIDE 0.9% FLUSH
3.0000 mL | Freq: Two times a day (BID) | INTRAVENOUS | Status: DC
  Administered 2020-02-06 – 2020-02-10 (×8): 3 mL via INTRAVENOUS

## 2020-02-06 MED ORDER — MIDAZOLAM HCL 2 MG/2ML IJ SOLN
INTRAMUSCULAR | Status: DC | PRN
  Administered 2020-02-06 (×2): 1 mg via INTRAVENOUS

## 2020-02-06 MED ORDER — CLOPIDOGREL BISULFATE 300 MG PO TABS
ORAL_TABLET | ORAL | Status: AC
  Filled 2020-02-06: qty 1

## 2020-02-06 MED ORDER — IODIXANOL 320 MG/ML IV SOLN
INTRAVENOUS | Status: DC | PRN
  Administered 2020-02-06: 110 mL

## 2020-02-06 MED ORDER — CLOPIDOGREL BISULFATE 75 MG PO TABS
75.0000 mg | ORAL_TABLET | Freq: Every day | ORAL | Status: DC
  Administered 2020-02-08 – 2020-02-10 (×3): 75 mg via ORAL
  Filled 2020-02-06 (×4): qty 1

## 2020-02-06 MED ORDER — LIDOCAINE HCL (PF) 1 % IJ SOLN
INTRAMUSCULAR | Status: AC
  Filled 2020-02-06: qty 30

## 2020-02-06 MED ORDER — HYDROCODONE-ACETAMINOPHEN 5-325 MG PO TABS
1.0000 | ORAL_TABLET | Freq: Four times a day (QID) | ORAL | Status: DC | PRN
  Administered 2020-02-06: 1 via ORAL
  Filled 2020-02-06: qty 1

## 2020-02-06 MED ORDER — HEPARIN (PORCINE) IN NACL 1000-0.9 UT/500ML-% IV SOLN
INTRAVENOUS | Status: AC
  Filled 2020-02-06: qty 1000

## 2020-02-06 MED ORDER — ACETAMINOPHEN 325 MG PO TABS: 650.0000 mg | ORAL_TABLET | ORAL | Status: DC | PRN

## 2020-02-06 MED ORDER — OXYCODONE HCL 5 MG PO TABS
5.0000 mg | ORAL_TABLET | ORAL | Status: DC | PRN
  Administered 2020-02-07 – 2020-02-10 (×8): 10 mg via ORAL
  Filled 2020-02-06 (×8): qty 2

## 2020-02-06 MED ORDER — HYDRALAZINE HCL 20 MG/ML IJ SOLN: 5.0000 mg | INTRAMUSCULAR | Status: DC | PRN

## 2020-02-06 MED ORDER — ONDANSETRON HCL 4 MG/2ML IJ SOLN: 4.0000 mg | Freq: Four times a day (QID) | INTRAMUSCULAR | Status: DC | PRN

## 2020-02-06 SURGICAL SUPPLY — 26 items
BALLN ADMIRAL INPACT 5X250 (BALLOONS) ×3
BALLN COYOTE OTW 4X220X150 (BALLOONS) ×3
BALLOON ADMIRAL INPACT 5X250 (BALLOONS) IMPLANT
BALLOON COYOTE OTW 4X220X150 (BALLOONS) IMPLANT
BUR JETSTREAM XC 2.1/3.0 (BURR) IMPLANT
BURR JETSTREAM XC 2.1/3.0 (BURR) ×3
CATH OMNI FLUSH 5F 65CM (CATHETERS) ×1 IMPLANT
CATH QUICKCROSS .035X135CM (MICROCATHETER) ×1 IMPLANT
CLOSURE MYNX CONTROL 6F/7F (Vascular Products) ×1 IMPLANT
DEVICE EMBOSHIELD NAV6 4.0-7.0 (FILTER) ×1 IMPLANT
GLIDEWIRE ADV .035X260CM (WIRE) ×1 IMPLANT
HOVERMATT SINGLE USE (MISCELLANEOUS) ×1 IMPLANT
KIT ENCORE 26 ADVANTAGE (KITS) ×1 IMPLANT
KIT MICROPUNCTURE NIT STIFF (SHEATH) ×1 IMPLANT
KIT PV (KITS) ×3 IMPLANT
LUBRICANT VIPERSLIDE CORONARY (MISCELLANEOUS) ×1 IMPLANT
SHEATH FLEXOR ANSEL 1 7F 45CM (SHEATH) ×1 IMPLANT
SHEATH PINNACLE 5F 10CM (SHEATH) ×1 IMPLANT
SHEATH PINNACLE 7F 10CM (SHEATH) ×1 IMPLANT
SHEATH PROBE COVER 6X72 (BAG) ×1 IMPLANT
STENT TIGRIS 5X60X120 (Permanent Stent) ×1 IMPLANT
SYR MEDRAD MARK V 150ML (SYRINGE) ×1 IMPLANT
TRANSDUCER W/STOPCOCK (MISCELLANEOUS) ×3 IMPLANT
TRAY PV CATH (CUSTOM PROCEDURE TRAY) ×3 IMPLANT
WIRE BAREWIRE WORK .014X315CM (WIRE) ×1 IMPLANT
WIRE BENTSON .035X145CM (WIRE) ×1 IMPLANT

## 2020-02-06 NOTE — Progress Notes (Signed)
PROGRESS NOTE    [name] [name]  VWU:[id number]  DOB: 06-22-1951  PCP: [employer] date:01/31/2020  69 y.o.femalewith h/oCADs/pCABG, combined systolic and diastolic CHF, PVD, ESRD on HD(TTS), DM type II, hyperparathyroidism, asthma, polysubstance abuse, tobacco abuse who was hospitalized last month for pulmonary edema, presented on 2/2 with complaints of chest pain/dyspnea x 3 days.  Patient was last dialyzed on 1/28. Since that time she had not been able to ambulate due to weakness and complaints of severe left great toe pain which she though might have been related to gouty flare. She also reports that this toe nailfell off approximately 2 weeks PTA.  Due to pain,  she has been more sedentary and not getting around as much.  ED Course: Afebrile Tmax 99.53F, BP 152/66, 98% sat on RA. Labs significant for hemoglobin 7.8, platelets 461, potassium 4.1, BUN 88, creatinine 8.57, and troponin 21.  Chest x-ray showed cardiomegaly with vascular congestion that appeared more so chronic. .  X-rays of the left foot revealed ulceration of the left first digit without radiographic evidence of osteomyelitis. VQ scan was ordered due to patient's hypoxia.  Hospital course: Patient admitted to [facility] for further evaluation and management with chest pain w/u, empiric antibiotics- vancomycin, Rocephin,metronidazole and Vascular surgery consult.Nephrology consulted for HD.   Subjective:  Patient back from angiogram, states is scheduled for toe amputation in next 48 hrs  Objective: Vitals:   02/06/20 0911 02/06/20 0911 02/06/20 0915 02/06/20 0920  BP:  (!) 148/70 130/62 135/60  Pulse: 77 77 79 79  Resp: 15 15 15 18   Temp:      TempSrc:      SpO2: 100% 100% 99% 100%  Weight:      Height:        Intake/Output Summary (Last 24 hours) at 02/06/2020 0944 Last data filed at 02/06/2020 2956 Gross per 24 hour  Intake 1124.4 ml  Output 225 ml  Net 899.4 ml   Filed Weights   02/05/20 0500 02/06/20 0500 02/06/20 0521  Weight: 119.4 kg 119.4 kg 119.4 kg    Physical Examination:  General exam: Appears calm and comfortable  Respiratory system: Clear to auscultation. Respiratory effort normal. Cardiovascular system: S1 & S2 heard, RRR. No JVD, murmurs, rubs, gallops or clicks. No pedal edema. Gastrointestinal system: Abdomen is nondistended, soft and nontender. Normal bowel sounds heard. Central nervous system: Alert and oriented. No new focal neurological deficits. Extremities: No contractures, edema or joint deformities.  Skin: gangrenous changes in left great toe as shown below Psychiatry: Judgement and insight appear normal. Mood & affect appropriate.       Data Reviewed: I have personally reviewed following labs and imaging studies  CBC: Recent Labs  Lab 01/31/20 1056 01/31/20 1056 02/01/20 0528 02/01/20 0528 02/02/20 0400 02/02/20 1524 02/03/20 0415 02/04/20 0814 02/06/20 0535  WBC 7.9   < > 8.0  --  6.9  --  9.2 7.4 7.5  NEUTROABS 5.7  --   --   --   --   --   --   --  4.9  HGB 7.8*   < > 7.0*   < > 6.7* 9.1* 9.2* 8.4* 9.5*  HCT 27.1*   < > 25.0*   < > 23.8* 29.9* 30.7* 28.6* 32.8*  MCV 86.9   < > 87.7  --  87.2  --  85.0 88.5 90.4  PLT 461*   < > 377  --  358  --  353 324  297   < > = values in this interval not displayed.   Basic Metabolic Panel: Recent Labs  Lab 02/01/20 0528 02/02/20 0400 02/03/20 0415 02/04/20 0814 02/06/20 0535  NA 141 141 140 142 141  K 4.7 4.2 3.9 3.8 3.9  CL 101 103 100 101 104  CO2 25 24 26 25 22   GLUCOSE 112* 138* 115* 80 138*  BUN 40* 52* 37* 51* 47*  CREATININE 5.07* 6.68* 5.09* 6.57* 5.87*  CALCIUM 7.9* 8.0* 8.4* 8.2* 8.0*  MG  --   --   --   --  1.6*  PHOS 4.4  --   --  6.1*  --    GFR: Estimated Creatinine Clearance: 12.3 mL/min (A) (by C-G formula based on SCr of 5.87 mg/dL (H)). Liver Function Tests: Recent Labs  Lab 01/31/20 1056 02/01/20 0528 02/04/20 0814 02/06/20 0535  AST 12*   --   --  20  ALT 10  --   --  12  ALKPHOS 111  --   --  98  BILITOT 0.3  --   --  0.3  PROT 6.5  --   --  6.1*  ALBUMIN 2.8* 2.5* 2.5* 2.7*   Recent Labs  Lab 01/31/20 1056  LIPASE 26   No results for input(s): AMMONIA in the last 168 hours. Coagulation Profile: No results for input(s): INR, PROTIME in the last 168 hours. Cardiac Enzymes: No results for input(s): CKTOTAL, CKMB, CKMBINDEX, TROPONINI in the last 168 hours. BNP (last 3 results) No results for input(s): PROBNP in the last 8760 hours. HbA1C: No results for input(s): HGBA1C in the last 72 hours. CBG: Recent Labs  Lab 02/05/20 1021 02/05/20 1117 02/05/20 1622 02/05/20 2056 02/06/20 0713  GLUCAP 242* 317* 198* 165* 107*   Lipid Profile: No results for input(s): CHOL, HDL, LDLCALC, TRIG, CHOLHDL, LDLDIRECT in the last 72 hours. Thyroid Function Tests: No results for input(s): TSH, T4TOTAL, FREET4, T3FREE, THYROIDAB in the last 72 hours. Anemia Panel: Recent Labs    02/06/20 0535  FERRITIN 242  TIBC 209*  IRON 50   Sepsis Labs: Recent Labs  Lab 01/31/20 1116 01/31/20 1330  LATICACIDVEN 1.5 1.2    Recent Results (from the past 240 hour(s))  Blood culture (routine x 2)     Status: None   Collection Time: 01/31/20 11:16 AM   Specimen: BLOOD RIGHT HAND  Result Value Ref Range Status   Specimen Description BLOOD RIGHT HAND  Final   Special Requests   Final    BOTTLES DRAWN AEROBIC ONLY Blood Culture results may not be optimal due to an inadequate volume of blood received in culture bottles   Culture   Final    NO GROWTH 5 DAYS Performed at [facility], [place] [address], [place], [place] [address]    Report Status 02/05/2020 FINAL  Final  Blood culture (routine x 2)     Status: None   Collection Time: 01/31/20 11:58 AM   Specimen: BLOOD  Result Value Ref Range Status   Specimen Description BLOOD RIGHT ANTECUBITAL  Final   Special Requests   Final    BOTTLES DRAWN AEROBIC AND ANAEROBIC Blood  Culture results may not be optimal due to an inadequate volume of blood received in culture bottles   Culture   Final    NO GROWTH 5 DAYS Performed at [facility], [place] [address] [place],  [address]    Report Status 02/05/2020 FINAL  Final  Respiratory  Panel by RT PCR (Flu A&B, Covid) - Nasopharyngeal Swab     Status: None   Collection Time: 01/31/20 12:01 PM   Specimen: Nasopharyngeal Swab  Result Value Ref Range Status   SARS Coronavirus 2 by RT PCR NEGATIVE NEGATIVE Final    Comment: (NOTE) SARS-CoV-2 target nucleic acids are NOT DETECTED. The SARS-CoV-2 RNA is generally detectable in upper respiratoy specimens during the acute phase of infection. The lowest concentration of SARS-CoV-2 viral copies this assay can detect is 131 copies/mL. A negative result does not preclude SARS-Cov-2 infection and should not be used as the sole basis for treatment or other patient management decisions. A negative result may occur with  improper specimen collection/handling, submission of specimen other than nasopharyngeal swab, presence of viral mutation(s) within the areas targeted by this assay, and inadequate number of viral copies (<131 copies/mL). A negative result must be combined with clinical observations, patient history, and epidemiological information. The expected result is Negative. Fact Sheet for Patients:  PinkCheek.be Fact Sheet for Healthcare Providers:  GravelBags.it This test is not yet ap proved or cleared by the [place] FDA and  has been authorized for detection and/or diagnosis of SARS-CoV-2 by FDA under an Emergency Use Authorization (EUA). This EUA will remain  in effect (meaning this test can be used) for the duration of the COVID-19 declaration under Section 564(b)(1) of the Act, 21 U.S.C. section 360bbb-3(b)(1), unless the authorization is terminated or revoked sooner.    Influenza A by PCR  NEGATIVE NEGATIVE Final   Influenza B by PCR NEGATIVE NEGATIVE Final    Comment: (NOTE) The Xpert Xpress SARS-CoV-2/FLU/RSV assay is intended as an aid in  the diagnosis of influenza from Nasopharyngeal swab specimens and  should not be used as a sole basis for treatment. Nasal washings and  aspirates are unacceptable for Xpert Xpress SARS-CoV-2/FLU/RSV  testing. Fact Sheet for Patients: PinkCheek.be Fact Sheet for Healthcare Providers: GravelBags.it This test is not yet approved or cleared by the [place] FDA and  has been authorized for detection and/or diagnosis of SARS-CoV-2 by  FDA under an Emergency Use Authorization (EUA). This EUA will remain  in effect (meaning this test can be used) for the duration of the  Covid-19 declaration under Section 564(b)(1) of the Act, 21  U.S.C. section 360bbb-3(b)(1), unless the authorization is  terminated or revoked. Performed at [facility], [place] [address] [place], [place] [address]   MRSA PCR Screening     Status: None   Collection Time: 02/01/20 12:00 AM   Specimen: Nasopharyngeal  Result Value Ref Range Status   MRSA by PCR NEGATIVE NEGATIVE Final    Comment:        The GeneXpert MRSA Assay (FDA approved for NASAL specimens only), is one component of a comprehensive MRSA colonization surveillance program. It is not intended to diagnose MRSA infection nor to guide or monitor treatment for MRSA infections. Performed at [facility], [place] [address], [place], [place] [address]       Radiology Studies: PERIPHERAL VASCULAR CATHETERIZATION  Result Date: 02/06/2020 Patient name: [name] [name] MRN: [id number] DOB: Oct 17, 1951 Sex: female 02/06/2020 Pre-operative Diagnosis: Critical left lower extremity ischemia with wound Post-operative diagnosis:  Same Surgeon:  Erlene Quan C. Donzetta Matters, MD Procedure Performed: 1.  Ultrasound-guided cannulation right common femoral artery 2.   Aortogram with bilateral lower extremity runoff 3.  Jetstream atherectomy of left SFA and popliteal arteries with distal filter protection 4.  Stent of left behind the  knee popliteal artery with 5 x 60 mm Tigris 5.  Drug-coated balloon angioplasty left SFA with 5 x 250 mm in.pact 6.  Minx device closure right common femoral artery 7.  Moderate sedation with fentanyl and Versed for 41minutes Indications: 69 year old female has end-stage renal disease.  She now has great toe ischemia on the left will likely require amputation.  She is indicated for aortogram possible intervention on the left lower extremity. 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  Common femoral artery on the right by ultrasound was calcified however no flow-limiting stenosis.  Left lower extremity which is the area of interest, femoral arteries patent by angiography.  Left SFA has multiple areas of greater than 50% stenosis and is occluded at the adductor reconstitutes below-knee popliteal artery with what appears to be three-vessel runoff dominant via the posterior tibial artery to the foot.  After intervention on the occluded popliteal artery was stented following jetstream atherectomy we now have 0% residual stenosis and the SFA is 0% residual stenosis where drug-coated balloon angioplasty was performed.  She still has three-vessel runoff to the foot with posterior tibial dominant.  The right lower extremity has a diminutive SFA with multiple greater than 50% stenosis in 1 area of likely frank occlusion.  Her popliteal artery above the knee reconstitutes this more robust than the left side has three-vessel runoff to the foot.  Procedure:  The patient was identified in the holding area and taken to room 8.  The patient was then placed supine on the table and prepped and draped in the usual sterile fashion.  A time out was called.  Ultrasound was  used to evaluate the right common femoral artery.  This was noted to be patent however was circumferentially calcified.  There is anesthetized 1% lidocaine cannulated with direct ultrasound visualization with micropuncture needle followed the wire sheath.  And images saved the permanent record.  A Bentson wire was placed followed by 5 [place] sheath.  Omni catheter was placed to the level of L1 and aortogram was performed followed bilateral lower extremity runoff with the above findings.  We elected to intervene.  We crossed the bifurcation with Omni catheter and Glidewire advantage.  A long 7 French sheath was placed patient was fully heparinized.  The cross catheter and Glidewire advantage were used to cross through the stenosed and occluded areas of the SFA and popliteal we gained intraluminal access below the knee and confirmed intraluminal access.  We then exchanged for an 014 bare wire and placed a Nav 6 embolic protection device.  Jetstream atherectomy was performed of the entire left SFA and popliteal arteries.  We then ballooned with 4 mm balloon of the entire segment.  There was obvious dissection of the distal popliteal this was stented primarily with Tigris 5 x 60 mm.  We then used a 5 x 250 mm in.pact drug-coated balloon angioplasty for the rest of the SFA.  We then postdilated our stent with 4 mm balloon.  Completion demonstrated no residual stenosis.  With three-vessel runoff to the foot.  The filter was removed was noted be intact.  She tolerated procedure without any complication. Contrast: 110cc Brandon C. Donzetta Matters, MD [facility] Office: [phone] Pager: [phone]       Scheduled Meds: . sodium chloride   Intravenous Once  . allopurinol  100 mg Oral BID  . aspirin EC  325  mg Oral Daily  . brinzolamide  1 drop Both Eyes BID  . Chlorhexidine Gluconate Cloth  6 each Topical Q0600  . Chlorhexidine Gluconate Cloth  6 each Topical Q0600  . feeding  supplement (NEPRO CARB STEADY)  237 mL Oral BID BM  . gabapentin  300 mg Oral QHS  . heparin  5,000 Units Subcutaneous Q8H  . insulin aspart  0-6 Units Subcutaneous TID WC  . latanoprost  1 drop Both Eyes QHS  . linaclotide  72 mcg Oral QAC breakfast  . metoprolol tartrate  25 mg Oral BID  . mometasone-formoterol  2 puff Inhalation BID  . multivitamin  1 tablet Oral QHS  . pantoprazole  40 mg Oral Daily  . pravastatin  40 mg Oral Daily  . sodium chloride flush  3 mL Intravenous Q12H  . sucroferric oxyhydroxide  500 mg Oral TID WC   Continuous Infusions: . sodium chloride 10 mL/hr at 02/06/20 0624  . cefTRIAXone (ROCEPHIN)  IV 2 g (02/06/20 0551)    Assessment & Plan:   Left great toe diabetic foot ulcer, PVD- Foot X-ray showedSoft tissue swelling, without osteomyelitis.MRIshowed focal area of ulceration in the first digit distal tuft, no sinus tract or abscess. Reactive marrow in the distal tuft of the first digit without definite evidence of osteomyelitis.-CRP2.3, sed rate 60 -Blood culturesnegative. Patient admitted with empiric Abx (vancomycin, Rocephin, metronidazole)- now only on ceftriaxone for cellulitis.  MRSA PCR negative. Unable to complete ABI. Vascular surgery consulted-underwent angiogram today 2/8- artherectomy of left SFA and stenting of left popliteal A. Per vascular surgery-left great toe amputation tomorrow in the OR as well as the left arm AV fistula with bleeding transposition versus graft placement. NPO after MN  Acute toxic encephalopathy. Now resolved. Likely from gabapentin dose increase.We reduced the gabapentin back to 300 mg nightly.ABG unremarkable.  CBC unremarkable.No focal deficit  Chest pain, likely musculoskeletal in nature-Troponin0.22-Chest pain improved per patient. ?Hypoxic on arrival per H&P --O2 sats documented as 98% on RA.  No acute CXR findings. Resumed on dialysis.  V/Q scan unremarkable. No c/o CP/dyspnea now. No further work up.  ESRD  on hemodialysis-Nephrology following for dialysis, tolerating well  Acute on chronic anemia of CKD-Hemoglobin on admission 6.7. s/p 2 units PRBC on 02/02/20. Ordered FOBT.No bleeding. So far stable after transfusion   CAD, status post CABG 2013-Continue aspirin, pravastatin  Hypertension-Continue Lopressor  Type 2 diabetes, insulin-dependent,well controlled-Hemoglobin A1c 5.0 in Jan 2021 -Sliding scale insulin  Hyperlipidemia-Continue pravastatin  Depression, anxiety-Continue Wellbutrin  GERD-Continue Protonix  Tobacco abuse-Cessation counseling, states that she currently smokes about 6 cigarettes daily, has been smoking since she was 69 yo  Obesity, Body mass index is 40.02 kg/m. Dietary consult Interventions: Nepro shake, MVI  Moderate protein calorie malnutrition: on protein supplements.   DVT prophylaxis: SQ Heparin Code Status: Full code Family / Patient Communication:  Disposition Plan: to SNF pending medical clearance (post toe amputationa nd cleared by vascular/ortho)     LOS: 6 days    Time spent: 35 minutes    Guilford Shi, MD [facility] Pager in [place]  If 7PM-7AM, please contact night-coverage [email or web address] Password [facility] 02/06/2020, 9:44 AM

## 2020-02-06 NOTE — Progress Notes (Addendum)
   Discussed with patient plan for left great toe amputation tomorrow in the OR as well as the left arm AV fistula with bleeding transposition versus graft placement.  I discussed with Dr. Jonnie Finner and we will move dialysis to later in the day.  Faatimah Spielberg C. Donzetta Matters, MD [facility] [place] Office: [phone] Pager: [phone]

## 2020-02-06 NOTE — Progress Notes (Signed)
Pt just signed consent form and was told that she can no longer eat or drink anything in preporation for the procedure. Pt understood. RN emptied all drinks from the room.   Eleanora Neighbor, RN

## 2020-02-06 NOTE — Anesthesia Preprocedure Evaluation (Signed)
Anesthesia Evaluation  Patient identified by MRN, date of birth, ID band Patient awake    Reviewed: Allergy & Precautions, NPO status , Patient's Chart, lab work & pertinent test results  Airway Mallampati: III  TM Distance: >3 FB Neck ROM: Full    Dental  (+) Edentulous Upper, Edentulous Lower   Pulmonary Current SmokerPatient did not abstain from smoking.,    Pulmonary exam normal breath sounds clear to auscultation       Cardiovascular hypertension, Pt. on medications + CAD, + Past MI, + CABG, + Peripheral Vascular Disease and +CHF  Normal cardiovascular exam Rhythm:Regular Rate:Normal  ECG: SR, rate 63   Neuro/Psych PSYCHIATRIC DISORDERS Depression CVA, Residual Symptoms    GI/Hepatic hiatal hernia, (+)     substance abuse  ,   Endo/Other  diabetes, Insulin Dependent  Renal/GU ESRF and DialysisRenal disease     Musculoskeletal Ambulates with cane   Abdominal   Peds  Hematology  (+) Blood dyscrasia, anemia , HLD Gout   Anesthesia Other Findings Anemia and heme positive stool  Reproductive/Obstetrics                             Anesthesia Physical  Anesthesia Plan  ASA: IV  Anesthesia Plan: MAC   Post-op Pain Management:    Induction:   PONV Risk Score and Plan: 1 and Propofol infusion and Treatment may vary due to age or medical condition  Airway Management Planned: Nasal Cannula  Additional Equipment:   Intra-op Plan:   Post-operative Plan:   Informed Consent: I have reviewed the patients History and Physical, chart, labs and discussed the procedure including the risks, benefits and alternatives for the proposed anesthesia with the patient or authorized representative who has indicated his/her understanding and acceptance.       Plan Discussed with: CRNA  Anesthesia Plan Comments:         Anesthesia Quick Evaluation

## 2020-02-06 NOTE — Op Note (Signed)
Patient name: [name] [name] MRN: [id number] DOB: 1951/09/01 Sex: female  02/06/2020 Pre-operative Diagnosis: Critical left lower extremity ischemia with wound Post-operative diagnosis:  Same Surgeon:  Erlene Quan C. Donzetta Matters, MD Procedure Performed: 1.  Ultrasound-guided cannulation right common femoral artery 2.  Aortogram with bilateral lower extremity runoff 3.  Jetstream atherectomy of left SFA and popliteal arteries with distal filter protection 4.  Stent of left behind the knee popliteal artery with 5 x 60 mm Tigris 5.  Drug-coated balloon angioplasty left SFA with 5 x 250 mm in.pact 6.  Minx device closure right common femoral artery 7.  Moderate sedation with fentanyl and Versed for 60minutes   Indications: 69 year old female has end-stage renal disease.  She now has great toe ischemia on the left will likely require amputation.  She is indicated for aortogram possible intervention on the left lower extremity.  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  Common femoral artery on the right by ultrasound was calcified however no flow-limiting stenosis.  Left lower extremity which is the area of interest, femoral arteries patent by angiography.  Left SFA has multiple areas of greater than 50% stenosis and is occluded at the adductor reconstitutes below-knee popliteal artery with what appears to be three-vessel runoff dominant via the posterior tibial artery to the foot.  After intervention on the occluded popliteal artery was stented following jetstream atherectomy we now have 0% residual stenosis and the SFA is 0% residual stenosis where drug-coated balloon angioplasty was performed.  She still has three-vessel runoff to the foot with posterior tibial dominant.  The right lower extremity has a diminutive SFA with multiple greater than 50% stenosis in 1 area of likely frank occlusion.  Her popliteal  artery above the knee reconstitutes this more robust than the left side has three-vessel runoff to the foot.   Procedure:  The patient was identified in the holding area and taken to room 8.  The patient was then placed supine on the table and prepped and draped in the usual sterile fashion.  A time out was called.  Ultrasound was used to evaluate the right common femoral artery.  This was noted to be patent however was circumferentially calcified.  There is anesthetized 1% lidocaine cannulated with direct ultrasound visualization with micropuncture needle followed the wire sheath.  And images saved the permanent record.  A Bentson wire was placed followed by 5 [place] sheath.  Omni catheter was placed to the level of L1 and aortogram was performed followed bilateral lower extremity runoff with the above findings.  We elected to intervene.  We crossed the bifurcation with Omni catheter and Glidewire advantage.  A long 7 French sheath was placed patient was fully heparinized.  The cross catheter and Glidewire advantage were used to cross through the stenosed and occluded areas of the SFA and popliteal we gained intraluminal access below the knee and confirmed intraluminal access.  We then exchanged for an 014 bare wire and placed a Nav 6 embolic protection device.  Jetstream atherectomy was performed of the entire left SFA and popliteal arteries.  We then ballooned with 4 mm balloon of the entire segment.  There was obvious dissection of the distal popliteal this was stented primarily with Tigris 5 x 60 mm.  We then used a 5 x 250 mm in.pact drug-coated balloon angioplasty for the rest of the SFA.  We then postdilated our stent with  4 mm balloon.  Completion demonstrated no residual stenosis.  With three-vessel runoff to the foot.  The filter was removed was noted be intact.  She tolerated procedure without any complication.  Contrast: 110cc  [name] [name] C. Donzetta Matters, MD [facility]  [place] Office: [phone] Pager: [phone]

## 2020-02-06 NOTE — Progress Notes (Signed)
Subjective:  Eating lunch  , noted today Dr. Donzetta Matters aortogram with stenting  Behind Knee popliteal artery/Angioplasty  L SFA  And plans tomor fr Left grt toe amputation in am  With LA AVF revision  / plans for hd  2nd shift tomor   Objective Vital signs in last 24 hours: Vitals:   02/06/20 0915 02/06/20 0920 02/06/20 0954 02/06/20 1359  BP: 130/62 135/60 (!) 114/47 (!) 133/52  Pulse: 79 79 76 76  Resp: 15 18 18    Temp:   97.6 F (36.4 C)   TempSrc:   Oral   SpO2: 99% 100% 99%   Weight:      Height:       Weight change: 0.4 kg  Physical Exam: General:Alert obese Female NAD OX3 Heart:RRR Lungs:CTA  , unlabored breathing Abdomen:Obese, soft, NT, ND Extremities:Trace pedal edema , Left grt Toe Tip Ulcer Dry Eschar  Dialysis Access:perm cath/ LUA AVF  Pos bruit distal 1/2   Dialysis Orders:[facility] T,Th,S 4 hours 15 min 180NRe 400/800 118kg 2.0 K/ 2.0 Ca TDC/ LUA  AVF -No heparin -Mircera 225 mcg IV q 2 weeks  ( LAST  ON 01/26/20) -Venofer 100 mg IVqtx through 2/11 -Calcitriol 1.62mcg PO TIW   Problem/Plan: 1. Acute Hypoxic Respiratory failure/Volume Overload: HDyesterday ev eng with 3UF tolerated.next Hd tomor after Dr Donzetta Matters Sugery   2. Chest pain: per primary, recent similar presentation.-resolved after admit hd uf  3. ESRD - T,Th,S via TDC. K+ 3.9 , No heparin. 4. LUA AVF malfunction= using Perm cath , noted Dr Donzetta Matters plan revision AVF vs AVGG insert  tomor am  5. Hypertension/volume -bp stable after hd uf Last pm UF to EDW as tolerated 6. Anemia - HGB9.5  this am/ FOR prbcS ON HD Recent max 225 MCG  ESA dose due  NEXT ON 2/11). Continue Fe load.  7. Metabolic bone disease - Continue binders,Calcitriol 1.75 TIW.Phos 6.1  Ca  8.0 Ok  8. Nutrition -alb 2.7  carb mod /Renal diet / renal vit . 9. Left grt toe Diabetic Ulcer = wu per admit and VVS  Noted L grt toe amp tomor  Dr Donzetta Matters  . On empiric Vanco, Rocephin, Metronidazole  10.   Tobacco ABUSE - ongoing problem ,dw need to stop . Currently no plan 11. Debilitation = she requests NHP/ Admit rx   [name], PA-C [facility] Beeper [phone] 02/06/2020,2:37 PM  LOS: 6 days   Labs: Basic Metabolic Panel: Recent Labs  Lab 02/01/20 0528 02/02/20 0400 02/03/20 0415 02/04/20 0814 02/06/20 0535  NA 141   < > 140 142 141  K 4.7   < > 3.9 3.8 3.9  CL 101   < > 100 101 104  CO2 25   < > 26 25 22   GLUCOSE 112*   < > 115* 80 138*  BUN 40*   < > 37* 51* 47*  CREATININE 5.07*   < > 5.09* 6.57* 5.87*  CALCIUM 7.9*   < > 8.4* 8.2* 8.0*  PHOS 4.4  --   --  6.1*  --    < > = values in this interval not displayed.   Liver Function Tests: Recent Labs  Lab 01/31/20 1056 01/31/20 1056 02/01/20 0528 02/04/20 0814 02/06/20 0535  AST 12*  --   --   --  20  ALT 10  --   --   --  12  ALKPHOS 111  --   --   --  98  BILITOT 0.3  --   --   --  0.3  PROT 6.5  --   --   --  6.1*  ALBUMIN 2.8*   < > 2.5* 2.5* 2.7*   < > = values in this interval not displayed.   Recent Labs  Lab 01/31/20 1056  LIPASE 26   No results for input(s): AMMONIA in the last 168 hours. CBC: Recent Labs  Lab 01/31/20 1056 01/31/20 1056 02/01/20 0528 02/01/20 0528 02/02/20 0400 02/02/20 1524 02/03/20 0415 02/04/20 0814 02/06/20 0535  WBC 7.9   < > 8.0   < > 6.9  --  9.2 7.4 7.5  NEUTROABS 5.7  --   --   --   --   --   --   --  4.9  HGB 7.8*   < > 7.0*   < > 6.7*   < > 9.2* 8.4* 9.5*  HCT 27.1*   < > 25.0*   < > 23.8*   < > 30.7* 28.6* 32.8*  MCV 86.9   < > 87.7  --  87.2  --  85.0 88.5 90.4  PLT 461*   < > 377   < > 358  --  353 324 297   < > = values in this interval not displayed.   Cardiac Enzymes: No results for input(s): CKTOTAL, CKMB, CKMBINDEX, TROPONINI in the last 168 hours. CBG: Recent Labs  Lab 02/05/20 1117 02/05/20 1622 02/05/20 2056 02/06/20 0713 02/06/20 1214  GLUCAP 317* 198* 165* 107* 181*    Studies/Results: PERIPHERAL VASCULAR  CATHETERIZATION  Result Date: 02/06/2020 Patient name: [name] [name] MRN: [id number] DOB: 05/12/1951 Sex: female 02/06/2020 Pre-operative Diagnosis: Critical left lower extremity ischemia with wound Post-operative diagnosis:  Same Surgeon:  Erlene Quan C. Donzetta Matters, MD Procedure Performed: 1.  Ultrasound-guided cannulation right common femoral artery 2.  Aortogram with bilateral lower extremity runoff 3.  Jetstream atherectomy of left SFA and popliteal arteries with distal filter protection 4.  Stent of left behind the knee popliteal artery with 5 x 60 mm Tigris 5.  Drug-coated balloon angioplasty left SFA with 5 x 250 mm in.pact 6.  Minx device closure right common femoral artery 7.  Moderate sedation with fentanyl and Versed for 60minutes Indications: 69 year old female has end-stage renal disease.  She now has great toe ischemia on the left will likely require amputation.  She is indicated for aortogram possible intervention on the left lower extremity. 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  Common femoral artery on the right by ultrasound was calcified however no flow-limiting stenosis.  Left lower extremity which is the area of interest, femoral arteries patent by angiography.  Left SFA has multiple areas of greater than 50% stenosis and is occluded at the adductor reconstitutes below-knee popliteal artery with what appears to be three-vessel runoff dominant via the posterior tibial artery to the foot.  After intervention on the occluded popliteal artery was stented following jetstream atherectomy we now have 0% residual stenosis and the SFA is 0% residual stenosis where drug-coated balloon angioplasty was performed.  She still has three-vessel runoff to the foot with posterior tibial dominant.  The right lower extremity has a diminutive SFA with multiple greater than 50% stenosis in 1 area of likely frank occlusion.  Her  popliteal artery above the knee reconstitutes this more robust than the left side has three-vessel runoff to the foot.  Procedure:  The patient was identified in the holding area and taken to room 8.  The patient was then placed supine on the table and prepped and draped in the usual sterile fashion.  A time out was called.  Ultrasound was used to evaluate the right common femoral artery.  This was noted to be patent however was circumferentially calcified.  There is anesthetized 1% lidocaine cannulated with direct ultrasound visualization with micropuncture needle followed the wire sheath.  And images saved the permanent record.  A Bentson wire was placed followed by 5 [place] sheath.  Omni catheter was placed to the level of L1 and aortogram was performed followed bilateral lower extremity runoff with the above findings.  We elected to intervene.  We crossed the bifurcation with Omni catheter and Glidewire advantage.  A long 7 French sheath was placed patient was fully heparinized.  The cross catheter and Glidewire advantage were used to cross through the stenosed and occluded areas of the SFA and popliteal we gained intraluminal access below the knee and confirmed intraluminal access.  We then exchanged for an 014 bare wire and placed a Nav 6 embolic protection device.  Jetstream atherectomy was performed of the entire left SFA and popliteal arteries.  We then ballooned with 4 mm balloon of the entire segment.  There was obvious dissection of the distal popliteal this was stented primarily with Tigris 5 x 60 mm.  We then used a 5 x 250 mm in.pact drug-coated balloon angioplasty for the rest of the SFA.  We then postdilated our stent with 4 mm balloon.  Completion demonstrated no residual stenosis.  With three-vessel runoff to the foot.  The filter was removed was noted be intact.  She tolerated procedure without any complication. Contrast: 110cc Brandon C. Donzetta Matters, MD [facility] [place]  Office: [phone] Pager: [phone]  Medications: . sodium chloride 10 mL/hr at 02/06/20 0624  . sodium chloride    . cefTRIAXone (ROCEPHIN)  IV 2 g (02/06/20 0551)   . sodium chloride   Intravenous Once  . allopurinol  100 mg Oral BID  . aspirin EC  325 mg Oral Daily  . brinzolamide  1 drop Both Eyes BID  . Chlorhexidine Gluconate Cloth  6 each Topical Q0600  . Chlorhexidine Gluconate Cloth  6 each Topical Q0600  . clopidogrel  300 mg Oral Once   Followed by  . [START ON 02/07/2020] clopidogrel  75 mg Oral Q breakfast  . feeding supplement (NEPRO CARB STEADY)  237 mL Oral BID BM  . gabapentin  300 mg Oral QHS  . heparin  5,000 Units Subcutaneous Q8H  . insulin aspart  0-6 Units Subcutaneous TID WC  . latanoprost  1 drop Both Eyes QHS  . linaclotide  72 mcg Oral QAC breakfast  . metoprolol tartrate  25 mg Oral BID  . mometasone-formoterol  2 puff Inhalation BID  . multivitamin  1 tablet Oral QHS  . pantoprazole  40 mg Oral Daily  . pravastatin  40 mg Oral Daily  . sodium chloride flush  3 mL Intravenous Q12H  . sodium chloride flush  3 mL Intravenous Q12H  . sucroferric oxyhydroxide  500 mg Oral TID WC

## 2020-02-06 NOTE — Progress Notes (Signed)
Pt called out stating that she had been picking her toe and got it bleeding. Left great toe was bleeding from the bottom of the toe. Pt had a little pile of skin on her bedside where she had been picking her toe. RN informed pt not to pick at toe and wrapped 2 4x4 gauze around it and taped it in place. RN also informed pt that she is on Heparin, which is a blood thinner, making it harder for the blood to clot. Pt stated that she understood and wouldn't pick at it anymore. Will continue to monitor.   Eleanora Neighbor, RN

## 2020-02-06 NOTE — Progress Notes (Signed)
  Progress Note    02/06/2020 7:28 AM Day of Surgery  Subjective:  No overnight issues  Vitals:   02/05/20 2122 02/06/20 0521  BP:  133/68  Pulse:  74  Resp:  18  Temp:  98.2 F (36.8 C)  SpO2: 99% 99%    Physical Exam: aaox3 Left great toe gangrene  CBC    Component Value Date/Time   WBC 7.5 02/06/2020 0535   RBC 3.63 (L) 02/06/2020 0535   HGB 9.5 (L) 02/06/2020 0535   HCT 32.8 (L) 02/06/2020 0535   HCT 22.0 (L) 10/06/2019 0021   PLT 297 02/06/2020 0535   MCV 90.4 02/06/2020 0535   MCH 26.2 02/06/2020 0535   MCHC 29.0 (L) 02/06/2020 0535   RDW 21.1 (H) 02/06/2020 0535   LYMPHSABS 1.4 02/06/2020 0535   MONOABS 0.8 02/06/2020 0535   EOSABS 0.2 02/06/2020 0535   BASOSABS 0.0 02/06/2020 0535    BMET    Component Value Date/Time   NA 141 02/06/2020 0535   K 3.9 02/06/2020 0535   CL 104 02/06/2020 0535   CO2 22 02/06/2020 0535   GLUCOSE 138 (H) 02/06/2020 0535   BUN 47 (H) 02/06/2020 0535   CREATININE 5.87 (H) 02/06/2020 0535   CREATININE 2.62 (H) 04/29/2016 1629   CALCIUM 8.0 (L) 02/06/2020 0535   GFRNONAA 7 (L) 02/06/2020 0535   GFRAA 8 (L) 02/06/2020 0535    INR    Component Value Date/Time   INR 1.2 08/25/2019 0433     Intake/Output Summary (Last 24 hours) at 02/06/2020 0728 Last data filed at 02/06/2020 2518 Gross per 24 hour  Intake 1344.4 ml  Output 225 ml  Net 1119.4 ml   ABI could not be obtained left leg  Assessment/plan:  69 y.o. female is here with gangrene of left great toe and will likely need amputation. Plan for angio today with possible intervention.   Ettie Krontz C. Donzetta Matters, MD [facility] [place] Office: [phone] Pager: [phone]  02/06/2020 7:28 AM

## 2020-02-06 NOTE — Progress Notes (Signed)
Went to [facility] to retrieve patient's Pensions consultant. Asleep upon return- left on bedside table. Pecolia Ades, RN, MSN  02/06/20 1:51 PM

## 2020-02-06 NOTE — Progress Notes (Signed)
PT Cancellation Note  Patient Details Name: [name] [name] MRN: [id number] DOB: 10/14/1951   Cancelled Treatment:    Reason Eval/Treat Not Completed: Fatigue/lethargy limiting ability to participate;Patient at procedure or test/unavailable.  Have made two attempts, shoe is in closet for L foot and will retry tomorrow   Ramond Dial 02/06/2020, 4:02 PM  Mee Hives, PT MS Acute Rehab Dept. Number: [place]

## 2020-02-06 NOTE — Progress Notes (Signed)
Pt is c/o pain in left great toe at 10/10. Pt does not want Tylenol and is requesting something stronger. RN messaged NP on call to make aware. Awaiting response.   Eleanora Neighbor, RN

## 2020-02-07 ENCOUNTER — Encounter (HOSPITAL_COMMUNITY)
Admission: EM | Disposition: A | Discharge status: Skilled Nursing Facility | Payer: Self-pay | Source: Home / Self Care | Attending: Internal Medicine

## 2020-02-07 ENCOUNTER — Inpatient Hospital Stay (HOSPITAL_COMMUNITY): Payer: Medicare (Managed Care) | Admitting: Certified Registered Nurse Anesthetist

## 2020-02-07 HISTORY — PX: REVISON OF ARTERIOVENOUS FISTULA: SHX6074

## 2020-02-07 HISTORY — PX: AMPUTATION: SHX166

## 2020-02-07 LAB — GLUCOSE, CAPILLARY
Glucose-Capillary: 110 mg/dL — ABNORMAL HIGH (ref 70–99)
Glucose-Capillary: 139 mg/dL — ABNORMAL HIGH (ref 70–99)
Glucose-Capillary: 265 mg/dL — ABNORMAL HIGH (ref 70–99)
Glucose-Capillary: 214 mg/dL — ABNORMAL HIGH (ref 70–99)

## 2020-02-07 LAB — RENAL FUNCTION PANEL
Albumin: 2.4 g/dL — ABNORMAL LOW (ref 3.5–5.0)
Anion gap: 14 (ref 5–15)
BUN: 67 mg/dL — ABNORMAL HIGH (ref 8–23)
CO2: 22 mmol/L (ref 22–32)
Calcium: 7.6 mg/dL — ABNORMAL LOW (ref 8.9–10.3)
Chloride: 103 mmol/L (ref 98–111)
Creatinine, Ser: 7.21 mg/dL — ABNORMAL HIGH (ref 0.44–1.00)
GFR calc Af Amer: 6 mL/min — ABNORMAL LOW (ref >60)
GFR calc non Af Amer: 5 mL/min — ABNORMAL LOW (ref >60)
Glucose, Bld: 177 mg/dL — ABNORMAL HIGH (ref 70–99)
Phosphorus: 7 mg/dL — ABNORMAL HIGH (ref 2.5–4.6)
Potassium: 4.3 mmol/L (ref 3.5–5.1)
Sodium: 139 mmol/L (ref 135–145)

## 2020-02-07 LAB — CBC
HCT: 28.6 % — ABNORMAL LOW (ref 36.0–46.0)
Hemoglobin: 8.3 g/dL — ABNORMAL LOW (ref 12.0–15.0)
MCH: 26.3 pg (ref 26.0–34.0)
MCHC: 29 g/dL — ABNORMAL LOW (ref 30.0–36.0)
MCV: 90.8 fL (ref 80.0–100.0)
Platelets: 256 10*3/uL (ref 150–400)
RBC: 3.15 MIL/uL — ABNORMAL LOW (ref 3.87–5.11)
RDW: 21 % — ABNORMAL HIGH (ref 11.5–15.5)
WBC: 7.6 10*3/uL (ref 4.0–10.5)
nRBC: 0 % (ref 0.0–0.2)

## 2020-02-07 LAB — SURGICAL PCR SCREEN
MRSA, PCR: NEGATIVE
Staphylococcus aureus: NEGATIVE

## 2020-02-07 SURGERY — REVISON OF ARTERIOVENOUS FISTULA
Anesthesia: Monitor Anesthesia Care | Site: Toe | Laterality: Left

## 2020-02-07 MED ORDER — HEPARIN SODIUM (PORCINE) 1000 UNIT/ML IJ SOLN
INTRAMUSCULAR | Status: AC
  Administered 2020-02-07: 3000 [IU]
  Filled 2020-02-07: qty 3

## 2020-02-07 MED ORDER — SODIUM CHLORIDE 0.9 % IV SOLN
INTRAVENOUS | Status: DC | PRN
  Administered 2020-02-07: 500 mL

## 2020-02-07 MED ORDER — HYDROMORPHONE HCL 1 MG/ML IJ SOLN
1.0000 mg | INTRAMUSCULAR | Status: DC | PRN
  Administered 2020-02-07 – 2020-02-08 (×2): 1 mg via INTRAVENOUS
  Filled 2020-02-07 (×2): qty 1

## 2020-02-07 MED ORDER — CALCITRIOL 0.25 MCG PO CAPS: 1.7500 ug | ORAL_CAPSULE | ORAL | Status: DC

## 2020-02-07 MED ORDER — DARBEPOETIN ALFA 200 MCG/0.4ML IJ SOSY
200.0000 ug | PREFILLED_SYRINGE | INTRAMUSCULAR | Status: DC
  Administered 2020-02-09: 200 ug via INTRAVENOUS

## 2020-02-07 MED ORDER — LIDOCAINE-EPINEPHRINE 0.5 %-1:200000 IJ SOLN
INTRAMUSCULAR | Status: DC | PRN
  Administered 2020-02-07: 20 mL
  Administered 2020-02-07: 10 mL

## 2020-02-07 MED ORDER — PROPOFOL 10 MG/ML IV BOLUS
INTRAVENOUS | Status: AC
  Filled 2020-02-07: qty 20

## 2020-02-07 MED ORDER — CALCITRIOL 0.5 MCG PO CAPS
ORAL_CAPSULE | ORAL | Status: AC
  Filled 2020-02-07: qty 3

## 2020-02-07 MED ORDER — LIDOCAINE HCL (PF) 1 % IJ SOLN
INTRAMUSCULAR | Status: AC
  Filled 2020-02-07: qty 30

## 2020-02-07 MED ORDER — LIDOCAINE-EPINEPHRINE 1 %-1:100000 IJ SOLN
INTRAMUSCULAR | Status: AC
  Filled 2020-02-07: qty 1

## 2020-02-07 MED ORDER — PHENYLEPHRINE HCL-NACL 10-0.9 MG/250ML-% IV SOLN
INTRAVENOUS | Status: DC | PRN
  Administered 2020-02-07: 40 ug/min via INTRAVENOUS

## 2020-02-07 MED ORDER — PROPOFOL 500 MG/50ML IV EMUL
INTRAVENOUS | Status: DC | PRN
  Administered 2020-02-07: 100 ug/kg/min via INTRAVENOUS

## 2020-02-07 MED ORDER — LIDOCAINE-EPINEPHRINE 0.5 %-1:200000 IJ SOLN
INTRAMUSCULAR | Status: AC
  Filled 2020-02-07: qty 1

## 2020-02-07 MED ORDER — SODIUM CHLORIDE 0.9 % IV SOLN
62.5000 mg | INTRAVENOUS | Status: DC
  Filled 2020-02-07: qty 5

## 2020-02-07 MED ORDER — CALCITRIOL 0.25 MCG PO CAPS
ORAL_CAPSULE | ORAL | Status: AC
  Administered 2020-02-07: 1.75 ug via ORAL
  Filled 2020-02-07: qty 1

## 2020-02-07 MED ORDER — SODIUM CHLORIDE 0.9 % IV SOLN
INTRAVENOUS | Status: AC
  Filled 2020-02-07: qty 1.2

## 2020-02-07 MED ORDER — 0.9 % SODIUM CHLORIDE (POUR BTL) OPTIME
TOPICAL | Status: DC | PRN
  Administered 2020-02-07: 1000 mL

## 2020-02-07 MED ORDER — PAPAVERINE HCL 30 MG/ML IJ SOLN
INTRAMUSCULAR | Status: AC
  Filled 2020-02-07: qty 2

## 2020-02-07 SURGICAL SUPPLY — 44 items
ADH SKN CLS APL DERMABOND .7 (GAUZE/BANDAGES/DRESSINGS) ×4
ARMBAND PINK RESTRICT EXTREMIT (MISCELLANEOUS) ×4 IMPLANT
BLADE AVERAGE 25MMX9MM (BLADE)
BLADE AVERAGE 25X9 (BLADE) IMPLANT
BLADE SAW SGTL 81X20 HD (BLADE) IMPLANT
BNDG ELASTIC 4X5.8 VLCR STR LF (GAUZE/BANDAGES/DRESSINGS) ×4 IMPLANT
BNDG GAUZE ELAST 4 BULKY (GAUZE/BANDAGES/DRESSINGS) ×4 IMPLANT
CANISTER SUCT 3000ML PPV (MISCELLANEOUS) ×4 IMPLANT
CLIP VESOCCLUDE MED 6/CT (CLIP) ×4 IMPLANT
CLIP VESOCCLUDE SM WIDE 6/CT (CLIP) ×4 IMPLANT
COVER PROBE W GEL 5X96 (DRAPES) ×4 IMPLANT
COVER SURGICAL LIGHT HANDLE (MISCELLANEOUS) ×4 IMPLANT
COVER WAND RF STERILE (DRAPES) ×4 IMPLANT
DERMABOND ADVANCED (GAUZE/BANDAGES/DRESSINGS) ×4
DERMABOND ADVANCED .7 DNX12 (GAUZE/BANDAGES/DRESSINGS) ×2 IMPLANT
DRAPE EXTREMITY T 121X128X90 (DISPOSABLE) ×4 IMPLANT
DRAPE HALF SHEET 40X57 (DRAPES) ×4 IMPLANT
ELECT PENCIL ROCKER SW 15FT (MISCELLANEOUS) ×2 IMPLANT
ELECT REM PT RETURN 9FT ADLT (ELECTROSURGICAL) ×4
ELECTRODE REM PT RTRN 9FT ADLT (ELECTROSURGICAL) ×2 IMPLANT
GAUZE SPONGE 4X4 12PLY STRL (GAUZE/BANDAGES/DRESSINGS) ×4 IMPLANT
GAUZE SPONGE 4X4 16PLY XRAY LF (GAUZE/BANDAGES/DRESSINGS) ×2 IMPLANT
GLOVE BIO SURGEON STRL SZ7.5 (GLOVE) ×4 IMPLANT
GOWN STRL REUS W/ TWL LRG LVL3 (GOWN DISPOSABLE) ×4 IMPLANT
GOWN STRL REUS W/ TWL XL LVL3 (GOWN DISPOSABLE) ×2 IMPLANT
GOWN STRL REUS W/TWL LRG LVL3 (GOWN DISPOSABLE) ×8
GOWN STRL REUS W/TWL XL LVL3 (GOWN DISPOSABLE) ×4
KIT BASIN OR (CUSTOM PROCEDURE TRAY) ×4 IMPLANT
KIT TURNOVER KIT B (KITS) ×4 IMPLANT
NDL HYPO 25GX1X1/2 BEV (NEEDLE) IMPLANT
NEEDLE HYPO 25GX1X1/2 BEV (NEEDLE) IMPLANT
NS IRRIG 1000ML POUR BTL (IV SOLUTION) ×4 IMPLANT
PACK CV ACCESS (CUSTOM PROCEDURE TRAY) ×4 IMPLANT
PACK GENERAL/GYN (CUSTOM PROCEDURE TRAY) ×4 IMPLANT
PAD ARMBOARD 7.5X6 YLW CONV (MISCELLANEOUS) ×8 IMPLANT
SUT ETHILON 3 0 PS 1 (SUTURE) ×4 IMPLANT
SUT MNCRL AB 4-0 PS2 18 (SUTURE) ×4 IMPLANT
SUT PROLENE 6 0 BV (SUTURE) ×4 IMPLANT
SUT VIC AB 3-0 SH 27 (SUTURE) ×4
SUT VIC AB 3-0 SH 27X BRD (SUTURE) ×2 IMPLANT
SYR CONTROL 10ML LL (SYRINGE) IMPLANT
TOWEL GREEN STERILE (TOWEL DISPOSABLE) ×8 IMPLANT
UNDERPAD 30X30 (UNDERPADS AND DIAPERS) ×4 IMPLANT
WATER STERILE IRR 1000ML POUR (IV SOLUTION) ×4 IMPLANT

## 2020-02-07 NOTE — Progress Notes (Signed)
Orthopedic Tech Progress Note Patient Details:  [name] [name] 1951/01/31 [id number]  Ortho Devices Type of Ortho Device: Darco shoe Ortho Device/Splint Location: LLE Ortho Device/Splint Interventions: Ordered, Application   Post Interventions Patient Tolerated: Well, Ambulated well Instructions Provided: Care of device, Poper ambulation with device, Adjustment of device   Janit Pagan 02/07/2020, 11:26 AM

## 2020-02-07 NOTE — Progress Notes (Signed)
Subjective:  Sp Dr [name] [name] L Gr  Toe Amputation and Revision Left arm Cephalic V AVF , now on hd tolerating uf   Objective Vital signs in last 24 hours: Vitals:   02/07/20 1125 02/07/20 1200 02/07/20 1230 02/07/20 1300  BP: (!) 122/59 (!) 106/59 (!) 122/58 (!) 112/59  Pulse: 98 80 77 79  Resp: 14     Temp:      TempSrc:      SpO2: 100%     Weight: 123.3 kg     Height:       Weight change: 0.939 kg  Physical Exam: General:Alert obese Female NAD OX3 Heart:RRR Lungs:CTA  , unlabored breathing Abdomen:Obese, soft, NT, ND Extremities:Trace pedal edema Left with Bandaged Left foot clean /dry ,  Dialysis Access:perm cath/ LUA AVF pos bruit  With surg inscn. clean /dry      Dialysis Orders:[facility] T,Th,S 4 hours 15 min 180NRe 400/800 118kg 2.0 K/ 2.0 Ca TDC/ LUA  AVF -No heparin -Mircera 225 mcg IV q 2 weeks( LAST ON 01/26/20) -Venofer 100 mg IVqtx through 2/11 -Calcitriol 1.84mcg PO TIW   Problem/Plan: 1. Acute Hypoxic Respiratory failure/Volume Overload: HD with 3UF tolerated.next Hd today  2. Chest pain: per primary, recent similar presentation.-resolved after admit hd uf  3. ESRD - T,Th,S via TDC. K+ 4.3  , No heparin. 4. LUA AVF malfunction= using Perm cath , sp  Dr [name] [name]  Revision of left arm cephalic vein AV fistula with transposition  5. Hypertension/volume -bp stable/ hd UF  as tolerated 6. Anemia - HGB9.5>8.3 this am/ Recent max225 MCGESA dose due NEXT ON 2/11). Continue Fe load.  7. Metabolic bone disease - Continue binders,Calcitriol 1.75 TIW.Phos 6.1  Ca  8.0 Ok  8. Nutrition -alb 2.7  carb mod /Renal diet / renal vit . 9. Left grt toe Diabetic Ulcer = wu per admit and VVS  Noted L grt toe amp tomor  Dr [name] [name]  . On empiric Vanco, Rocephin, Metronidazole 10. Tobacco ABUSE - ongoing problem ,dw need to stop . Currently no plan 11. Debilitation = she requests NHP/ Admit rx  [name], PA-C [facility] [phone] 02/07/2020,1:23 PM  LOS: 7 days   Labs: Basic Metabolic Panel: Recent Labs  Lab 02/01/20 0528 02/02/20 0400 02/04/20 0814 02/06/20 0535 02/07/20 1122  NA 141   < > 142 141 139  K 4.7   < > 3.8 3.9 4.3  CL 101   < > 101 104 103  CO2 25   < > 25 22 22   GLUCOSE 112*   < > 80 138* 177*  BUN 40*   < > 51* 47* 67*  CREATININE 5.07*   < > 6.57* 5.87* 7.21*  CALCIUM 7.9*   < > 8.2* 8.0* 7.6*  PHOS 4.4  --  6.1*  --  7.0*   < > = values in this interval not displayed.   Liver Function Tests: Recent Labs  Lab 02/04/20 0814 02/06/20 0535 02/07/20 1122  AST  --  20  --   ALT  --  12  --   ALKPHOS  --  98  --   BILITOT  --  0.3  --   PROT  --  6.1*  --   ALBUMIN 2.5* 2.7* 2.4*   No results for input(s): LIPASE, AMYLASE in the last 168 hours. No results for input(s): AMMONIA in the last 168 hours. CBC: Recent Labs  Lab 02/02/20 0400 02/02/20 1524 02/03/20 0415 02/03/20  1914 02/04/20 0814 02/06/20 0535 02/07/20 1122  WBC 6.9  --  9.2   < > 7.4 7.5 7.6  NEUTROABS  --   --   --   --   --  4.9  --   HGB 6.7*   < > 9.2*   < > 8.4* 9.5* 8.3*  HCT 23.8*   < > 30.7*   < > 28.6* 32.8* 28.6*  MCV 87.2  --  85.0  --  88.5 90.4 90.8  PLT 358  --  353   < > 324 297 256   < > = values in this interval not displayed.   Cardiac Enzymes: No results for input(s): CKTOTAL, CKMB, CKMBINDEX, TROPONINI in the last 168 hours. CBG: Recent Labs  Lab 02/06/20 1214 02/06/20 1652 02/06/20 2129 02/07/20 0916 02/07/20 1113  GLUCAP 181* 144* 155* 110* 139*    Studies/Results: PERIPHERAL VASCULAR CATHETERIZATION  Result Date: 02/06/2020 Patient name: [name] [name] MRN: [id number] DOB: 1951-04-21 Sex: female 02/06/2020 Pre-operative Diagnosis: Critical left lower extremity ischemia with wound Post-operative diagnosis:  Same Surgeon:  Erlene Quan C. [name] Matters, MD Procedure Performed: 1.  Ultrasound-guided cannulation right common femoral artery 2.  Aortogram with bilateral lower  extremity runoff 3.  Jetstream atherectomy of left SFA and popliteal arteries with distal filter protection 4.  Stent of left behind the knee popliteal artery with 5 x 60 mm Tigris 5.  Drug-coated balloon angioplasty left SFA with 5 x 250 mm in.pact 6.  Minx device closure right common femoral artery 7.  Moderate sedation with fentanyl and Versed for 19minutes Indications: 69 year old female has end-stage renal disease.  She now has great toe ischemia on the left will likely require amputation.  She is indicated for aortogram possible intervention on the left lower extremity. 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  Common femoral artery on the right by ultrasound was calcified however no flow-limiting stenosis.  Left lower extremity which is the area of interest, femoral arteries patent by angiography.  Left SFA has multiple areas of greater than 50% stenosis and is occluded at the adductor reconstitutes below-knee popliteal artery with what appears to be three-vessel runoff dominant via the posterior tibial artery to the foot.  After intervention on the occluded popliteal artery was stented following jetstream atherectomy we now have 0% residual stenosis and the SFA is 0% residual stenosis where drug-coated balloon angioplasty was performed.  She still has three-vessel runoff to the foot with posterior tibial dominant.  The right lower extremity has a diminutive SFA with multiple greater than 50% stenosis in 1 area of likely frank occlusion.  Her popliteal artery above the knee reconstitutes this more robust than the left side has three-vessel runoff to the foot.  Procedure:  The patient was identified in the holding area and taken to room 8.  The patient was then placed supine on the table and prepped and draped in the usual sterile fashion.  A time out was called.  Ultrasound was used to evaluate the right common  femoral artery.  This was noted to be patent however was circumferentially calcified.  There is anesthetized 1% lidocaine cannulated with direct ultrasound visualization with micropuncture needle followed the wire sheath.  And images saved the permanent record.  A Bentson wire was placed followed by 5 [place] sheath.  Omni catheter was placed to the level of L1 and aortogram was performed followed bilateral lower  extremity runoff with the above findings.  We elected to intervene.  We crossed the bifurcation with Omni catheter and Glidewire advantage.  A long 7 French sheath was placed patient was fully heparinized.  The cross catheter and Glidewire advantage were used to cross through the stenosed and occluded areas of the SFA and popliteal we gained intraluminal access below the knee and confirmed intraluminal access.  We then exchanged for an 014 bare wire and placed a Nav 6 embolic protection device.  Jetstream atherectomy was performed of the entire left SFA and popliteal arteries.  We then ballooned with 4 mm balloon of the entire segment.  There was obvious dissection of the distal popliteal this was stented primarily with Tigris 5 x 60 mm.  We then used a 5 x 250 mm in.pact drug-coated balloon angioplasty for the rest of the SFA.  We then postdilated our stent with 4 mm balloon.  Completion demonstrated no residual stenosis.  With three-vessel runoff to the foot.  The filter was removed was noted be intact.  She tolerated procedure without any complication. Contrast: 110cc Brandon C. [name] Matters, MD [facility] [place] Office: [phone] Pager: [phone]  Medications: . sodium chloride 10 mL/hr at 02/07/20 0727  . sodium chloride    . cefTRIAXone (ROCEPHIN)  IV 2 g (02/07/20 0538)   . sodium chloride   Intravenous Once  . allopurinol  100 mg Oral BID  . aspirin EC  325 mg Oral Daily  . brinzolamide  1 drop Both Eyes BID  . Chlorhexidine Gluconate Cloth  6 each Topical Q0600  .  Chlorhexidine Gluconate Cloth  6 each Topical Q0600  . clopidogrel  300 mg Oral Once   Followed by  . clopidogrel  75 mg Oral Q breakfast  . feeding supplement (NEPRO CARB STEADY)  237 mL Oral BID BM  . gabapentin  300 mg Oral QHS  . heparin  5,000 Units Subcutaneous Q8H  . insulin aspart  0-6 Units Subcutaneous TID WC  . latanoprost  1 drop Both Eyes QHS  . linaclotide  72 mcg Oral QAC breakfast  . metoprolol tartrate  25 mg Oral BID  . mometasone-formoterol  2 puff Inhalation BID  . multivitamin  1 tablet Oral QHS  . pantoprazole  40 mg Oral Daily  . pravastatin  40 mg Oral Daily  . sodium chloride flush  3 mL Intravenous Q12H  . sodium chloride flush  3 mL Intravenous Q12H  . sucroferric oxyhydroxide  500 mg Oral TID WC

## 2020-02-07 NOTE — Op Note (Signed)
    Patient name: [name] [name] MRN: [id number] DOB: November 27, 1951 Sex: female  02/07/2020 Pre-operative Diagnosis: esrd, necrotic left great toe Post-operative diagnosis:  Same Surgeon:  Eda Paschal. Donzetta Matters, MD Assistant: Arlee Muslim, PA Procedure Performed: 1.  Revision of left arm cephalic vein AV fistula with transposition 2.  Left great toe amputation  Indications: 69 year old female previously has undergone placement of a left arm cephalic vein fistula which unfortunately has infiltrated on his only attempted use.  She is currently on dialysis via catheter.  She recently has gangrenous changes to her left great toe.  She is indicated for revision of her fistula as well as great toe amputation on the left.  Findings: Fistula itself was quite tortuous measured approximately 1 cm external diameter.  There was 1 area of weakening of the wall this was excised.  Fistula was tunneled laterally.  At completion was a very strong thrill and a good signal of the radial artery at the wrist.  There was adequately bleeding in the wound bed of the toe for healing possibility.   Procedure:  The patient was identified in the holding area and taken to the operating room where she was placed supine on the operative MAC anesthesia induced.  She was sterilely prepped and draped in left upper extremity in the left foot in usual fashion antibiotics were minister and timeout was called.  We began with the left arm.  Ultrasound was used to identify the fistula.  The areas anesthetized 1% lidocaine.  2 separate incisions were made.  We dissected out the entirety of fistula dividing branches between clips and ties.  Wound was fully dissected out believe marked for orientation.  We clamped it near the antecubitum.  I transected it.  I excised an area of weakened wall.  I tunneled the fistula laterally flushed with heparinized saline and reclamped.  I then sewed end-to-end with 6-0 Prolene suture.  Prior to completion we allowed  flushing all directions.  Upon completion there was a very strong thrill confirmed with Doppler.  There is good radial artery signal at the wrist.  These incisions were closed with Vicryl and Monocryl and Dermabond was placed in the level of skin.  We turned attention to the left foot.  The area was anesthetized 1% lidocaine.  A tennis racquet type incision was made around the first toe.  I dissected down to the bone.  I excised the first toe at the metatarsal phalangeal joint.  Rongeur was used at the joint space.  There was smoothed with rasp.  Wound was thoroughly irrigated.  I closed with interrupted 3-0 nylon suture.  A sterile dressing was placed.  Patient was awake from anesthesia having tolerated procedure without immediate complication.  All counts were correct at completion.   EBL: 20 cc.   Matilde Markie C. Donzetta Matters, MD [facility] [place] Office: [phone] Pager: [phone]

## 2020-02-07 NOTE — Transfer of Care (Signed)
Immediate Anesthesia Transfer of Care Note  Patient: [name] [name]  Procedure(s) Performed: REVISON OF ARTERIOVENOUS FISTULA (Left Arm Lower) AMPUTATION DIGIT - LEFT GREAT TOE (Left Toe)  Patient Location: PACU  Anesthesia Type:MAC  Level of Consciousness: awake  Airway & Oxygen Therapy: Patient Spontanous Breathing  Post-op Assessment: Report given to RN and Post -op Vital signs reviewed and stable  Post vital signs: Reviewed and stable  Last Vitals:  Vitals Value Taken Time  BP 97/73 02/07/20 0902  Temp    Pulse 72 02/07/20 0904  Resp 16 02/07/20 0904  SpO2 100 % 02/07/20 0904  Vitals shown include unvalidated device data.  Last Pain:  Vitals:   02/07/20 0500  TempSrc: Oral  PainSc:       Patients Stated Pain Goal: 2 (17/47/15 9539)  Complications: No apparent anesthesia complications

## 2020-02-07 NOTE — Progress Notes (Signed)
  Progress Note    02/07/2020 7:21 AM Day of Surgery  Subjective:  No overnight issues  Vitals:   02/07/20 0400 02/07/20 0500  BP: 122/60 129/68  Pulse: 74 76  Resp: 19 12  Temp:  98.3 F (36.8 C)  SpO2: 98% 100%    Physical Exam: [email or web address] Left arm fistula has a thrill, appears quite tortuous Left great toe with stable gangrenous changes  CBC    Component Value Date/Time   WBC 7.5 02/06/2020 0535   RBC 3.63 (L) 02/06/2020 0535   HGB 9.5 (L) 02/06/2020 0535   HCT 32.8 (L) 02/06/2020 0535   HCT 22.0 (L) 10/06/2019 0021   PLT 297 02/06/2020 0535   MCV 90.4 02/06/2020 0535   MCH 26.2 02/06/2020 0535   MCHC 29.0 (L) 02/06/2020 0535   RDW 21.1 (H) 02/06/2020 0535   LYMPHSABS 1.4 02/06/2020 0535   MONOABS 0.8 02/06/2020 0535   EOSABS 0.2 02/06/2020 0535   BASOSABS 0.0 02/06/2020 0535    BMET    Component Value Date/Time   NA 141 02/06/2020 0535   K 3.9 02/06/2020 0535   CL 104 02/06/2020 0535   CO2 22 02/06/2020 0535   GLUCOSE 138 (H) 02/06/2020 0535   BUN 47 (H) 02/06/2020 0535   CREATININE 5.87 (H) 02/06/2020 0535   CREATININE 2.62 (H) 04/29/2016 1629   CALCIUM 8.0 (L) 02/06/2020 0535   GFRNONAA 7 (L) 02/06/2020 0535   GFRAA 8 (L) 02/06/2020 0535    INR    Component Value Date/Time   INR 1.2 08/25/2019 0433     Intake/Output Summary (Last 24 hours) at 02/07/2020 0721 Last data filed at 02/06/2020 2124 Gross per 24 hour  Intake 6 ml  Output 150 ml  Net -144 ml     Assessment/plan:  69 y.o. female is s/p left sfa/popliteal revascularization gangrenous changes to her left great toe.  She is now needed left great amputation.  She is also on dialysis via tunneled dialysis catheter has previously undergone placement fistula in the left arm.  She states that there was an attempt at using the fistula but it was infiltrated.  We will plan to revise the fistula with likely transposition.  Dialysis will be after the OR today.  Toniesha Zellner C. Donzetta Matters, MD [facility] [place] Office: [phone] Pager: [phone]  02/07/2020 7:21 AM

## 2020-02-07 NOTE — Progress Notes (Signed)
PROGRESS NOTE    [name] [name]  YQM:[id number]  DOB: 21-May-1951  PCP: [facility] date:01/31/2020  69 y.o.femalewith h/oCADs/pCABG, combined systolic and diastolic CHF, PVD, ESRD on HD(TTS), DM type II, hyperparathyroidism, asthma, polysubstance abuse, tobacco abuse who was hospitalized last month for pulmonary edema, presented on 2/2 with complaints of chest pain/dyspnea x 3 days.  Patient was last dialyzed on 1/28. Since that time she had not been able to ambulate due to weakness and complaints of severe left great toe pain which she though might have been related to gouty flare. She also reports that this toe nailfell off approximately 2 weeks PTA.  Due to pain,  she has been more sedentary and not getting around as much.  ED Course: Afebrile Tmax 99.20F, BP 152/66, 98% sat on RA. Labs significant for hemoglobin 7.8, platelets 461, potassium 4.1, BUN 88, creatinine 8.57, and troponin 21.  Chest x-ray showed cardiomegaly with vascular congestion that appeared more so chronic. .  X-rays of the left foot revealed ulceration of the left first digit without radiographic evidence of osteomyelitis. VQ scan was ordered due to patient's hypoxia.  Hospital course: Patient admitted to [facility] for further evaluation and management with chest pain w/u, empiric antibiotics- vancomycin, Rocephin,metronidazole and Vascular surgery consult.Nephrology consulted for HD.   Subjective:  Patient back from the OR and tolerated toe amputation well.  She denied any acute pain when seen in rounds.  She is optimistic that she could transition to home from acute hospitalization soon  Objective: Vitals:   02/07/20 1300 02/07/20 1330 02/07/20 1400 02/07/20 1430  BP: (!) 112/59 111/61 116/61 (!) 107/37  Pulse: 79 79 77 78  Resp:      Temp:      TempSrc:      SpO2:      Weight:      Height:        Intake/Output Summary (Last 24 hours) at 02/07/2020 1450 Last data filed at 02/07/2020  1054 Gross per 24 hour  Intake 216 ml  Output 150 ml  Net 66 ml   Filed Weights   02/06/20 0521 02/07/20 0500 02/07/20 1125  Weight: 119.4 kg 120.3 kg 123.3 kg    Physical Examination:  General exam: Appears calm and comfortable  Respiratory system: Clear to auscultation. Respiratory effort normal. Cardiovascular system: S1 & S2 heard, RRR. No JVD, murmurs, rubs, gallops or clicks. No pedal edema. Gastrointestinal system: Abdomen is nondistended, soft and nontender. Normal bowel sounds heard. Central nervous system: Alert and oriented. No new focal neurological deficits. Extremities: No contractures, edema or joint deformities.  Skin: gangrenous changes in left great toe as shown below Psychiatry: Judgement and insight appear normal. Mood & affect appropriate.       Data Reviewed: I have personally reviewed following labs and imaging studies  CBC: Recent Labs  Lab 02/02/20 0400 02/02/20 0400 02/02/20 1524 02/03/20 0415 02/04/20 0814 02/06/20 0535 02/07/20 1122  WBC 6.9  --   --  9.2 7.4 7.5 7.6  NEUTROABS  --   --   --   --   --  4.9  --   HGB 6.7*   < > 9.1* 9.2* 8.4* 9.5* 8.3*  HCT 23.8*   < > 29.9* 30.7* 28.6* 32.8* 28.6*  MCV 87.2  --   --  85.0 88.5 90.4 90.8  PLT 358  --   --  353 324 297 256   < > = values in this interval not  displayed.   Basic Metabolic Panel: Recent Labs  Lab 02/01/20 0528 02/01/20 0528 02/02/20 0400 02/03/20 0415 02/04/20 0814 02/06/20 0535 02/07/20 1122  NA 141   < > 141 140 142 141 139  K 4.7   < > 4.2 3.9 3.8 3.9 4.3  CL 101   < > 103 100 101 104 103  CO2 25   < > 24 26 25 22 22   GLUCOSE 112*   < > 138* 115* 80 138* 177*  BUN 40*   < > 52* 37* 51* 47* 67*  CREATININE 5.07*   < > 6.68* 5.09* 6.57* 5.87* 7.21*  CALCIUM 7.9*   < > 8.0* 8.4* 8.2* 8.0* 7.6*  MG  --   --   --   --   --  1.6*  --   PHOS 4.4  --   --   --  6.1*  --  7.0*   < > = values in this interval not displayed.   GFR: Estimated Creatinine Clearance:  10.2 mL/min (A) (by C-G formula based on SCr of 7.21 mg/dL (H)). Liver Function Tests: Recent Labs  Lab 02/01/20 0528 02/04/20 0814 02/06/20 0535 02/07/20 1122  AST  --   --  20  --   ALT  --   --  12  --   ALKPHOS  --   --  98  --   BILITOT  --   --  0.3  --   PROT  --   --  6.1*  --   ALBUMIN 2.5* 2.5* 2.7* 2.4*   No results for input(s): LIPASE, AMYLASE in the last 168 hours. No results for input(s): AMMONIA in the last 168 hours. Coagulation Profile: No results for input(s): INR, PROTIME in the last 168 hours. Cardiac Enzymes: No results for input(s): CKTOTAL, CKMB, CKMBINDEX, TROPONINI in the last 168 hours. BNP (last 3 results) No results for input(s): PROBNP in the last 8760 hours. HbA1C: No results for input(s): HGBA1C in the last 72 hours. CBG: Recent Labs  Lab 02/06/20 1214 02/06/20 1652 02/06/20 2129 02/07/20 0916 02/07/20 1113  GLUCAP 181* 144* 155* 110* 139*   Lipid Profile: No results for input(s): CHOL, HDL, LDLCALC, TRIG, CHOLHDL, LDLDIRECT in the last 72 hours. Thyroid Function Tests: No results for input(s): TSH, T4TOTAL, FREET4, T3FREE, THYROIDAB in the last 72 hours. Anemia Panel: Recent Labs    02/06/20 0535  FERRITIN 242  TIBC 209*  IRON 50   Sepsis Labs: No results for input(s): PROCALCITON, LATICACIDVEN in the last 168 hours.  Recent Results (from the past 240 hour(s))  Blood culture (routine x 2)     Status: None   Collection Time: 01/31/20 11:16 AM   Specimen: BLOOD RIGHT HAND  Result Value Ref Range Status   Specimen Description BLOOD RIGHT HAND  Final   Special Requests   Final    BOTTLES DRAWN AEROBIC ONLY Blood Culture results may not be optimal due to an inadequate volume of blood received in culture bottles   Culture   Final    NO GROWTH 5 DAYS Performed at [facility], [place] [address], [place], [place] [address]    Report Status 02/05/2020 FINAL  Final  Blood culture (routine x 2)     Status: None   Collection  Time: 01/31/20 11:58 AM   Specimen: BLOOD  Result Value Ref Range Status   Specimen Description BLOOD RIGHT ANTECUBITAL  Final   Special Requests   Final  BOTTLES DRAWN AEROBIC AND ANAEROBIC Blood Culture results may not be optimal due to an inadequate volume of blood received in culture bottles   Culture   Final    NO GROWTH 5 DAYS Performed at [facility], [place] [address], [place], [place] [address]    Report Status 02/05/2020 FINAL  Final  Respiratory Panel by RT PCR (Flu A&B, Covid) - Nasopharyngeal Swab     Status: None   Collection Time: 01/31/20 12:01 PM   Specimen: Nasopharyngeal Swab  Result Value Ref Range Status   SARS Coronavirus 2 by RT PCR NEGATIVE NEGATIVE Final    Comment: (NOTE) SARS-CoV-2 target nucleic acids are NOT DETECTED. The SARS-CoV-2 RNA is generally detectable in upper respiratoy specimens during the acute phase of infection. The lowest concentration of SARS-CoV-2 viral copies this assay can detect is 131 copies/mL. A negative result does not preclude SARS-Cov-2 infection and should not be used as the sole basis for treatment or other patient management decisions. A negative result may occur with  improper specimen collection/handling, submission of specimen other than nasopharyngeal swab, presence of viral mutation(s) within the areas targeted by this assay, and inadequate number of viral copies (<131 copies/mL). A negative result must be combined with clinical observations, patient history, and epidemiological information. The expected result is Negative. Fact Sheet for Patients:  PinkCheek.be Fact Sheet for Healthcare Providers:  GravelBags.it This test is not yet ap proved or cleared by the [place] FDA and  has been authorized for detection and/or diagnosis of SARS-CoV-2 by FDA under an Emergency Use Authorization (EUA). This EUA will remain  in effect (meaning this test can be  used) for the duration of the COVID-19 declaration under Section 564(b)(1) of the Act, 21 U.S.C. section 360bbb-3(b)(1), unless the authorization is terminated or revoked sooner.    Influenza A by PCR NEGATIVE NEGATIVE Final   Influenza B by PCR NEGATIVE NEGATIVE Final    Comment: (NOTE) The Xpert Xpress SARS-CoV-2/FLU/RSV assay is intended as an aid in  the diagnosis of influenza from Nasopharyngeal swab specimens and  should not be used as a sole basis for treatment. Nasal washings and  aspirates are unacceptable for Xpert Xpress SARS-CoV-2/FLU/RSV  testing. Fact Sheet for Patients: PinkCheek.be Fact Sheet for Healthcare Providers: GravelBags.it This test is not yet approved or cleared by the [place] FDA and  has been authorized for detection and/or diagnosis of SARS-CoV-2 by  FDA under an Emergency Use Authorization (EUA). This EUA will remain  in effect (meaning this test can be used) for the duration of the  Covid-19 declaration under Section 564(b)(1) of the Act, 21  U.S.C. section 360bbb-3(b)(1), unless the authorization is  terminated or revoked. Performed at [facility], [place] [address], [place], [place] [address]   MRSA PCR Screening     Status: None   Collection Time: 02/01/20 12:00 AM   Specimen: Nasopharyngeal  Result Value Ref Range Status   MRSA by PCR NEGATIVE NEGATIVE Final    Comment:        The GeneXpert MRSA Assay (FDA approved for NASAL specimens only), is one component of a comprehensive MRSA colonization surveillance program. It is not intended to diagnose MRSA infection nor to guide or monitor treatment for MRSA infections. Performed at [facility], [place] [address], [place], [place] [address]   Surgical pcr screen     Status: None   Collection Time: 02/06/20 11:35 PM   Specimen: Nasal Mucosa; Nasal Swab  Result Value Ref Range Status   MRSA, PCR NEGATIVE NEGATIVE Final    Staphylococcus aureus NEGATIVE NEGATIVE Final    Comment: (NOTE) The Xpert SA Assay (FDA approved for NASAL specimens in patients 22 years of age and older), is one component of a comprehensive surveillance program. It is not intended to diagnose infection nor to guide or monitor treatment. Performed at [facility], [place] [address] [place], [place] [address]       Radiology Studies: PERIPHERAL VASCULAR CATHETERIZATION  Result Date: 02/06/2020 Patient name: [name] [name] MRN: [id number] DOB: 02-May-1951 Sex: female 02/06/2020 Pre-operative Diagnosis: Critical left lower extremity ischemia with wound Post-operative diagnosis:  Same Surgeon:  Erlene Quan C. Donzetta Matters, MD Procedure Performed: 1.  Ultrasound-guided cannulation right common femoral artery 2.  Aortogram with bilateral lower extremity runoff 3.  Jetstream atherectomy of left SFA and popliteal arteries with distal filter protection 4.  Stent of left behind the knee popliteal artery with 5 x 60 mm Tigris 5.  Drug-coated balloon angioplasty left SFA with 5 x 250 mm in.pact 6.  Minx device closure right common femoral artery 7.  Moderate sedation with fentanyl and Versed for 49minutes Indications: 69 year old female has end-stage renal disease.  She now has great toe ischemia on the left will likely require amputation.  She is indicated for aortogram possible intervention on the left lower extremity. 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  Common femoral artery on the right by ultrasound was calcified however no flow-limiting stenosis.  Left lower extremity which is the area of interest, femoral arteries patent by angiography.  Left SFA has multiple areas of greater than 50% stenosis and is occluded at the adductor reconstitutes below-knee popliteal artery with what appears to be three-vessel runoff dominant via the posterior tibial artery to the foot.   After intervention on the occluded popliteal artery was stented following jetstream atherectomy we now have 0% residual stenosis and the SFA is 0% residual stenosis where drug-coated balloon angioplasty was performed.  She still has three-vessel runoff to the foot with posterior tibial dominant.  The right lower extremity has a diminutive SFA with multiple greater than 50% stenosis in 1 area of likely frank occlusion.  Her popliteal artery above the knee reconstitutes this more robust than the left side has three-vessel runoff to the foot.  Procedure:  The patient was identified in the holding area and taken to room 8.  The patient was then placed supine on the table and prepped and draped in the usual sterile fashion.  A time out was called.  Ultrasound was used to evaluate the right common femoral artery.  This was noted to be patent however was circumferentially calcified.  There is anesthetized 1% lidocaine cannulated with direct ultrasound visualization with micropuncture needle followed the wire sheath.  And images saved the permanent record.  A Bentson wire was placed followed by 5 [place] sheath.  Omni catheter was placed to the level of L1 and aortogram was performed followed bilateral lower extremity runoff with the above findings.  We elected to intervene.  We crossed the bifurcation with Omni catheter and Glidewire advantage.  A long 7 French sheath was placed patient was fully heparinized.  The cross catheter and Glidewire advantage were used to cross through the stenosed and occluded areas of the SFA and popliteal we gained intraluminal access below the knee and confirmed intraluminal access.  We then  exchanged for an 014 bare wire and placed a Nav 6 embolic protection device.  Jetstream atherectomy was performed of the entire left SFA and popliteal arteries.  We then ballooned with 4 mm balloon of the entire segment.  There was obvious dissection of the distal popliteal this was stented primarily with  Tigris 5 x 60 mm.  We then used a 5 x 250 mm in.pact drug-coated balloon angioplasty for the rest of the SFA.  We then postdilated our stent with 4 mm balloon.  Completion demonstrated no residual stenosis.  With three-vessel runoff to the foot.  The filter was removed was noted be intact.  She tolerated procedure without any complication. Contrast: 110cc Brandon C. Donzetta Matters, MD [facility] Office: [phone] Pager: [phone]       Scheduled Meds:  sodium chloride   Intravenous Once   allopurinol  100 mg Oral BID   aspirin EC  325 mg Oral Daily   brinzolamide  1 drop Both Eyes BID   calcitRIOL       [START ON 02/09/2020] calcitRIOL  1.75 mcg Oral Q T,Th,Sa-HD   Chlorhexidine Gluconate Cloth  6 each Topical Q0600   Chlorhexidine Gluconate Cloth  6 each Topical Q0600   clopidogrel  300 mg Oral Once   Followed by   clopidogrel  75 mg Oral Q breakfast   [START ON 02/09/2020] darbepoetin (ARANESP) injection - DIALYSIS  200 mcg Intravenous Q Thu-HD   feeding supplement (NEPRO CARB STEADY)  237 mL Oral BID BM   gabapentin  300 mg Oral QHS   heparin  5,000 Units Subcutaneous Q8H   insulin aspart  0-6 Units Subcutaneous TID WC   latanoprost  1 drop Both Eyes QHS   linaclotide  72 mcg Oral QAC breakfast   metoprolol tartrate  25 mg Oral BID   mometasone-formoterol  2 puff Inhalation BID   multivitamin  1 tablet Oral QHS   pantoprazole  40 mg Oral Daily   pravastatin  40 mg Oral Daily   sodium chloride flush  3 mL Intravenous Q12H   sodium chloride flush  3 mL Intravenous Q12H   sucroferric oxyhydroxide  500 mg Oral TID WC   Continuous Infusions:  sodium chloride 10 mL/hr at 02/07/20 0727   sodium chloride     cefTRIAXone (ROCEPHIN)  IV 2 g (02/07/20 0538)   [START ON 02/09/2020] ferric gluconate (FERRLECIT/NULECIT) IV      Assessment & Plan:   Left great toe diabetic foot ulcer, PVD- Foot X-ray showedSoft tissue swelling,  without osteomyelitis.MRIshowed focal area of ulceration in the first digit distal tuft, no sinus tract or abscess. Reactive marrow in the distal tuft of the first digit without definite evidence of osteomyelitis.-CRP2.3, sed rate 60 -Blood culturesnegative. Patient admitted with empiric Abx (vancomycin, Rocephin, metronidazole)- now only on ceftriaxone for cellulitis.  MRSA PCR negative. Unable to complete ABI. Vascular surgery consulted-underwent angiogram today 2/8- artherectomy of left SFA and stenting of left popliteal A.  Underwent left great toe amputation by vascular surgery this morning. Revision of left arm cephalic vein AV fistula with transposition was also performed while in OR.  Can likely DC antibiotics in a.m.  Pain medication as needed available.  Acute toxic encephalopathy. Now resolved. Likely from gabapentin dose increase.We reduced the gabapentin back to 300 mg nightly.ABG unremarkable.  CBC unremarkable.No focal deficit  Chest pain, likely musculoskeletal in nature-Troponin0.22-Chest pain improved per patient. ?Hypoxic on arrival per H&P --O2 sats documented as 98% on  RA.  No acute CXR findings. Resumed on dialysis.  V/Q scan unremarkable. No c/o CP/dyspnea now. No further work up.  ESRD on hemodialysis-Nephrology following for dialysis, tolerating well  Acute on chronic anemia of CKD-Hemoglobin on admission 6.7. s/p 2 units PRBC on 02/02/20. Ordered FOBT.No bleeding. So far stable after transfusion   CAD, status post CABG 2013-Continue aspirin, pravastatin  Hypertension-Continue Lopressor  Type 2 diabetes, insulin-dependent,well controlled-Hemoglobin A1c 5.0 in Jan 2021 -Sliding scale insulin  Hyperlipidemia-Continue pravastatin  Depression, anxiety-Continue Wellbutrin  GERD-Continue Protonix  Tobacco abuse-Cessation counseling, states that she currently smokes about 6 cigarettes daily, has been smoking since she was 69 yo  Obesity, Body mass index is  40.02 kg/m. Dietary consult Interventions: Nepro shake, MVI  Moderate protein calorie malnutrition: on protein supplements.   DVT prophylaxis: SQ Heparin Code Status: Full code Family / Patient Communication:  Disposition Plan: PT recommended SNF but patient hopeful that she can transition home with home PT soon.    LOS: 7 days    Time spent: 35 minutes    Guilford Shi, MD [facility] Pager in [place]  If 7PM-7AM, please contact night-coverage [email or web address] Password [email or web address] 02/07/2020, 2:50 PM

## 2020-02-08 DIAGNOSIS — I96 Gangrene, not elsewhere classified: Secondary | ICD-10-CM

## 2020-02-08 LAB — GLUCOSE, CAPILLARY
Glucose-Capillary: 133 mg/dL — ABNORMAL HIGH (ref 70–99)
Glucose-Capillary: 200 mg/dL — ABNORMAL HIGH (ref 70–99)
Glucose-Capillary: 159 mg/dL — ABNORMAL HIGH (ref 70–99)
Glucose-Capillary: 208 mg/dL — ABNORMAL HIGH (ref 70–99)

## 2020-02-08 MED ORDER — CHLORHEXIDINE GLUCONATE CLOTH 2 % EX PADS
6.0000 | MEDICATED_PAD | Freq: Every day | CUTANEOUS | Status: DC
  Administered 2020-02-08 – 2020-02-09 (×2): 6 via TOPICAL

## 2020-02-08 NOTE — Progress Notes (Signed)
Nutrition Follow up   DOCUMENTATION CODES:   Obesity unspecified  INTERVENTION:    Continue Nepro Shake po BID, each supplement provides 425 kcal and 19 grams protein  Continue Renal MVI daily   NUTRITION DIAGNOSIS:   Increased nutrient needs related to wound healing as evidenced by estimated needs.  Ongoing  GOAL:   Patient will meet greater than or equal to 90% of their needs   Progressing   MONITOR:   PO intake, Supplement acceptance, Weight trends, Labs, I & O's, Skin  REASON FOR ASSESSMENT:   Malnutrition Screening Tool    ASSESSMENT:   Patient with PMH significant for CAD s/p CABG, CHF, PVD, ESRD on HD, DM, hyperparathyroidism, asthma, and polysubstance abuse. Presents this admission with L toe diabetic ulcer and chest pain.    2/8- aortogram, L behind knee stent, cannulation R common fem artery 2/9- L great toe amputation, revision L AVF  Pt has great appetite per reports. Meal completion charted as 40-100% for her last 5 meals (86% average). Drinking one Nepro daily. Encouraged pt to drink two daily to maximize protein post op. CBGs showing consistently as >200. May need to consider  adjusting meal time insulin.   EDW: 118 kg  Current weight: 120 kg   I/O: -2,554 ml since admit  UOP: 225 ml x 24 hrs  Last HD yesterday: 2000 ml net UF   Medications: calcitriol, aranesp, SS novolog, rena-vit, velphoro  Labs: Phosphorus 7.0 (H) CBG 133-265   Diet Order:   Diet Order            Diet renal with fluid restriction Fluid restriction: 1200 mL Fluid; Room service appropriate? No; Fluid consistency: Thin  Diet effective now              EDUCATION NEEDS:   Education needs have been addressed  Skin:  Skin Assessment: Skin Integrity Issues: Skin Integrity Issues:: Other (Comment), Diabetic Ulcer Diabetic Ulcer: L great toe Other: MASD- breast, abdomen Incision- L arm, L foot   Last BM:  2/9  Height:   Ht Readings from Last 1 Encounters:   01/31/20 5\' 8"  (1.727 m)    Weight:   Wt Readings from Last 1 Encounters:  02/08/20 120 kg    Ideal Body Weight:  70 kg  BMI:  Body mass index is 40.22 kg/m.  Estimated Nutritional Needs:   Kcal:  2100-2300 kcal  Protein:  110-125 grams  Fluid:  1000 ml + UOP   Mariana Single RD, LDN Clinical Nutrition Pager listed in [place]

## 2020-02-08 NOTE — Care Management Important Message (Signed)
Important Message  Patient Details  Name: [name] [name] MRN: [id number] Date of Birth: 1951/09/23   Medicare Important Message Given:  Yes     Shelda Altes 02/08/2020, 1:20 PM

## 2020-02-08 NOTE — Progress Notes (Signed)
Subjective:  2 L off w/ HD yest  Objective Vital signs in last 24 hours: Vitals:   02/08/20 0555 02/08/20 0557 02/08/20 0836 02/08/20 1102  BP: 100/73  130/72   Pulse:   100   Resp: 16 15  (!) 21  Temp: 98.5 F (36.9 C)     TempSrc: Oral     SpO2: 98%     Weight:      Height:       Weight change: 2.961 kg  Physical Exam: General:Alert obese Female NAD OX3 Heart:RRR Lungs:CTA  , unlabored breathing Abdomen:Obese, soft, NT, ND Extremities:Trace pedal edema Left with Bandaged Left foot clean /dry ,  Dialysis Access:perm cath/ LUA AVF pos bruit  With surg inscn. clean /dry      Dialysis Orders:[facility] T,Th,S   4h 78min  118kg  2/2 bath  TDC/ LUA AVF  Hep none -Mircera 225 mcg IV q 2 weeks( LAST ON 01/26/20) -Venofer 100 mg IVqtx through 2/11 -Calcitriol 1.96mcg PO TIW   Problem/Plan: 1. Left grt toe Diabetic Ulcer = wu per admit and [facility]. LLE perc revasc L SFA/ popliteal arteries on 02/06/20. Now is SP L great toe amp on 2/9 per Dr Donzetta Matters. Abx x 3 > down to Rocephin now. MRI neg for osteo.  2. ESRD - T,Th,S via TDC. K+ 4.3  , No heparin. 3. LUA AVF malfunction= using Perm cath , sp  Dr Donzetta Matters  Revision of left arm cephalic vein AV fistula 2/9 with transposition 4. Acute Hypoxic Respiratory failure/Volume Overload: resolved w/ HD 5. Chest pain: per primary, recent similar presentation.-resolved after admit hd uf 6. Hypertension/volume -bp stable/ hd UF  as tolerated. 2kg up 7. Anemia - HGB9.5>8.3 this am/ Recent max225 MCGESA dose due NEXT ON 2/11). Continue Fe load.  8. Metabolic bone disease - Continue binders,Calcitriol 1.75 TIW.Phos 6.1  Ca  8.0 Ok  9. Nutrition -alb 2.7  carb mod /Renal diet / renal vit 10. Tobacco ABUSE - ongoing problem ,dw need to stop . Currently no plan 11. Debilitation = she requests NHP/ Admit rx  [name] Splinter, MD 02/08/2020, 11:13 AM      Labs: Basic Metabolic Panel: Recent Labs  Lab 02/04/20 0814  02/06/20 0535 02/07/20 1122  NA 142 141 139  K 3.8 3.9 4.3  CL 101 104 103  CO2 25 22 22   GLUCOSE 80 138* 177*  BUN 51* 47* 67*  CREATININE 6.57* 5.87* 7.21*  CALCIUM 8.2* 8.0* 7.6*  PHOS 6.1*  --  7.0*   Liver Function Tests: Recent Labs  Lab 02/04/20 0814 02/06/20 0535 02/07/20 1122  AST  --  20  --   ALT  --  12  --   ALKPHOS  --  98  --   BILITOT  --  0.3  --   PROT  --  6.1*  --   ALBUMIN 2.5* 2.7* 2.4*   No results for input(s): LIPASE, AMYLASE in the last 168 hours. No results for input(s): AMMONIA in the last 168 hours. CBC: Recent Labs  Lab 02/02/20 0400 02/02/20 1524 02/03/20 0415 02/03/20 0415 02/04/20 0814 02/06/20 0535 02/07/20 1122  WBC 6.9  --  9.2   < > 7.4 7.5 7.6  NEUTROABS  --   --   --   --   --  4.9  --   HGB 6.7*   < > 9.2*   < > 8.4* 9.5* 8.3*  HCT 23.8*   < > 30.7*   < >  28.6* 32.8* 28.6*  MCV 87.2  --  85.0  --  88.5 90.4 90.8  PLT 358  --  353   < > 324 297 256   < > = values in this interval not displayed.   Cardiac Enzymes: No results for input(s): CKTOTAL, CKMB, CKMBINDEX, TROPONINI in the last 168 hours. CBG: Recent Labs  Lab 02/07/20 0916 02/07/20 1113 02/07/20 1611 02/07/20 2123 02/08/20 0620  GLUCAP 110* 139* 265* 214* 133*    Studies/Results: No results found. Medications: . sodium chloride 10 mL/hr at 02/07/20 0727  . sodium chloride    . [START ON 02/09/2020] ferric gluconate (FERRLECIT/NULECIT) IV     . sodium chloride   Intravenous Once  . allopurinol  100 mg Oral BID  . aspirin EC  325 mg Oral Daily  . brinzolamide  1 drop Both Eyes BID  . [START ON 02/09/2020] calcitRIOL  1.75 mcg Oral Q T,Th,Sa-HD  . Chlorhexidine Gluconate Cloth  6 each Topical Q0600  . Chlorhexidine Gluconate Cloth  6 each Topical Q0600  . clopidogrel  300 mg Oral Once   Followed by  . clopidogrel  75 mg Oral Q breakfast  . [START ON 02/09/2020] darbepoetin (ARANESP) injection - DIALYSIS  200 mcg Intravenous Q Thu-HD  . feeding  supplement (NEPRO CARB STEADY)  237 mL Oral BID BM  . gabapentin  300 mg Oral QHS  . heparin  5,000 Units Subcutaneous Q8H  . insulin aspart  0-6 Units Subcutaneous TID WC  . latanoprost  1 drop Both Eyes QHS  . linaclotide  72 mcg Oral QAC breakfast  . metoprolol tartrate  25 mg Oral BID  . mometasone-formoterol  2 puff Inhalation BID  . multivitamin  1 tablet Oral QHS  . pantoprazole  40 mg Oral Daily  . pravastatin  40 mg Oral Daily  . sodium chloride flush  3 mL Intravenous Q12H  . sodium chloride flush  3 mL Intravenous Q12H  . sucroferric oxyhydroxide  500 mg Oral TID WC

## 2020-02-08 NOTE — Progress Notes (Addendum)
  Progress Note    02/08/2020 8:04 AM 1 Day Post-Op  Subjective:  Some shooting pain into left foot/ amputation site. No pain in left upper extremity. Denies numbness, tingling or pain in left arm or hand. No other complaints   Vitals:   02/08/20 0555 02/08/20 0557  BP: 100/73   Pulse:    Resp: 16 15  Temp: 98.5 F (36.9 C)   SpO2: 98%    Physical Exam: General: Alert and oriented, appears comfortable Lungs:  Non labored Incisions: Left 1st toe amputation site, intact, blood drainage on bandage but no drainage on compression. Sutures present. No necrosis present. Left foot warm. Left upper extremity Left AV fistula, clean, dry, intact. Ecchymosis present along incision line. Non tender, no Hematoma. Left hand warm. Normal grip strength left hand Extremities: Left femoral pulse 2+, left popliteal pulse non palpable, left posterior tibial pulse and dorsalis pedis not palpable. Biphasic Doppler PT, DP. No swelling, cyanosis or edema.  Abdomen:  Obese, soft, non tender Neuro: Normal strength and sensation bilaterally   CBC    Component Value Date/Time   WBC 7.6 02/07/2020 1122   RBC 3.15 (L) 02/07/2020 1122   HGB 8.3 (L) 02/07/2020 1122   HCT 28.6 (L) 02/07/2020 1122   HCT 22.0 (L) 10/06/2019 0021   PLT 256 02/07/2020 1122   MCV 90.8 02/07/2020 1122   MCH 26.3 02/07/2020 1122   MCHC 29.0 (L) 02/07/2020 1122   RDW 21.0 (H) 02/07/2020 1122   LYMPHSABS 1.4 02/06/2020 0535   MONOABS 0.8 02/06/2020 0535   EOSABS 0.2 02/06/2020 0535   BASOSABS 0.0 02/06/2020 0535    BMET    Component Value Date/Time   NA 139 02/07/2020 1122   K 4.3 02/07/2020 1122   CL 103 02/07/2020 1122   CO2 22 02/07/2020 1122   GLUCOSE 177 (H) 02/07/2020 1122   BUN 67 (H) 02/07/2020 1122   CREATININE 7.21 (H) 02/07/2020 1122   CREATININE 2.62 (H) 04/29/2016 1629   CALCIUM 7.6 (L) 02/07/2020 1122   GFRNONAA 5 (L) 02/07/2020 1122   GFRAA 6 (L) 02/07/2020 1122    INR    Component Value  Date/Time   INR 1.2 08/25/2019 0433     Intake/Output Summary (Last 24 hours) at 02/08/2020 0804 Last data filed at 02/07/2020 2100 Gross per 24 hour  Intake 453 ml  Output 2000 ml  Net -1547 ml     Assessment/Plan:  69 y.o. female is s/p revision of left arm cephalic vein AV fistula with transposition and Left great toe amputation 1 Day Post-Op. Left AV fistula without steal symptoms. Some ecchymosis surrounding incision. She will have follow up in the office in 2-3 weeks for incision check. Continues to dialyze via catheter. Left 1st toe amputation site dressings changed. Wound intact. Continue observation of amputation site for adequate healing  DVT prophylaxis:  SQ Heparin   Karoline Caldwell, PA-C [facility] [phone] 02/08/2020 8:04 AM    I have independently interviewed and examined patient and agree with PA assessment and plan above. Ok to dc antibiotics from vascular standpoint. Likely will need 1 month prior to use of left arm avf. Protect right foot given similar occlusive picture as left.   Shakiara Lukic C. Donzetta Matters, MD [facility] [place] Office: [phone] Pager: [phone]

## 2020-02-08 NOTE — Progress Notes (Signed)
Physical Therapy Treatment Patient Details Name: [name] [name] MRN: [id number] DOB: [birth year] Today's Date: 02/08/2020    History of Present Illness [name] [name] is a 69 y.o. female with medical history significant of CAD s/p CABG, combined systolic and diastolic CHF, PVD, ESRD on HD (TTS), DM type II, hyperparathyroidism, asthma, polysubstance abuse, tobacco abuse. She presented with complaints of chest pain over the last 3 days, likely musculoskeletal in nature. Also with left great toe diabetic foot ulcer. 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     PT Comments    Patient seen for mobility progression. Pt limited by pain and fatigue. Pt able to stand briefly with mod A and RW however leaning on RW and then with posterior LOB onto bed when attempting to stand upright. Pt able to do squat pivot transfer to drop ram recliner with min A.  Given pt's current mobility level, history of falls, and 14 stairs to enter home continue to recommend SNF for further skilled PT services to maximize independence and safety with mobility.    Follow Up Recommendations  SNF     Equipment Recommendations  Rolling walker with 5" wheels;3in1 (PT)    Recommendations for Other Services       Precautions / Restrictions Precautions Precautions: Fall Required Braces or Orthoses: Other Brace Other Brace: darco shoe Restrictions Weight Bearing Restrictions: No Other Position/Activity Restrictions: no active WB orders per chart review; use of darco shoe and educated on weightbearing on heel    Mobility  Bed Mobility Overal bed mobility: Modified Independent             General bed mobility comments: use of rail  Transfers Overall transfer level: Needs assistance Equipment used: Rolling walker (2 wheeled) Transfers: Sit to/from Stand Sit to Stand: Mod assist         General transfer comment: cues for safe hand placement; assistance required to power up into standing and cues for upright posture  and for safe use of AD in standing as pt leaning on forearms; when pt attempted transitioning from leaning on forearms to standing with hands on RW grips had posterior LOB back onto the bed; pt then able to squat pivot to drop arm recliner with min A   Ambulation/Gait             General Gait Details: unable this session   Stairs             Wheelchair Mobility    Modified Rankin (Stroke Patients Only)       Balance Overall balance assessment: Needs assistance Sitting-balance support: Feet supported Sitting balance-Leahy Scale: Good     Standing balance support: Bilateral upper extremity supported;During functional activity Standing balance-Leahy Scale: Poor                              Cognition Arousal/Alertness: Awake/alert Behavior During Therapy: WFL for tasks assessed/performed Overall Cognitive Status: Within Functional Limits for tasks assessed                                        Exercises      General Comments        Pertinent Vitals/Pain Pain Assessment: Faces Faces Pain Scale: Hurts little more Pain Location: left first toe Pain Descriptors / Indicators: Discomfort;Grimacing;Guarding    Home Living  Prior Function            PT Goals (current goals can now be found in the care plan section) Progress towards PT goals: Progressing toward goals    Frequency    Min 3X/week      PT Plan Current plan remains appropriate    Co-evaluation              AM-PAC PT "6 Clicks" Mobility   Outcome Measure  Help needed turning from your back to your side while in a flat bed without using bedrails?: None Help needed moving from lying on your back to sitting on the side of a flat bed without using bedrails?: A Little Help needed moving to and from a bed to a chair (including a wheelchair)?: A Little Help needed standing up from a chair using your arms (e.g., wheelchair or  bedside chair)?: A Little Help needed to walk in hospital room?: A Lot Help needed climbing 3-5 steps with a railing? : Total 6 Click Score: 16    End of Session Equipment Utilized During Treatment: Gait belt Activity Tolerance: Patient limited by pain;Patient limited by fatigue Patient left: with call bell/phone within reach;in chair Nurse Communication: Mobility status PT Visit Diagnosis: Unsteadiness on feet (R26.81);Pain;Difficulty in walking, not elsewhere classified (R26.2) Pain - Right/Left: Left Pain - part of body: Ankle and joints of foot     Time: 1200-1221 PT Time Calculation (min) (ACUTE ONLY): 21 min  Charges:  $Gait Training: 8-22 mins                     Earney Navy, PTA Acute Rehabilitation Services Pager: [phone] Office: [phone]     Darliss Cheney 02/08/2020, 1:54 PM

## 2020-02-08 NOTE — Progress Notes (Signed)
Patient ID: [name] [name], female   DOB: July 12, 1951, 69 y.o.   MRN: [id number]  PROGRESS NOTE    GENOVEVA SINGLETON  EPP:[id number] DOB: 09/13/1951 DOA: 01/31/2020 PCP: [facility]   Brief Narrative:  69 year old female with history of CAD status post CABG, combined systolic and diastolic CHF, PVD, end-stage renal disease on hemodialysis, diabetes mellitus type 2, hyperparathyroidism, asthma, polysubstance abuse, tobacco abuse presented on 01/31/2020 with chest pain and dyspnea along with severe left great toe pain.  On presentation, chest x-ray showed cardiomegaly with vascular congestion.  X-ray of the left foot revealed ulceration of the left first digit without evidence of osteomyelitis.  She was admitted and started on broad-spectrum antibiotics.  Vascular surgery and nephrology were consulted.  She underwent aortogram with atherectomy of left SFA and popliteal arteries and stenting of left behind the knee popliteal artery on 02/06/2020.  She then underwent left great toe amputation and revision of left arm cephalic vein AV fistula on 02/07/2020 by vascular surgery.  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  MRI showed focal area of ulceration in the first digit distal tuft, no sinus tract or absces without definite evidence of osteomyelitis -Was empirically started on vancomycin, Rocephin and Flagyl and subsequently switched to Rocephin alone.  Cultures have been negative so far. -She underwent aortogram with atherectomy of left SFA and popliteal arteries and stenting of left behind the knee popliteal artery on 02/06/2020.   -She then underwent left great toe amputation and revision of left arm cephalic vein AV fistula on 02/07/2020 by vascular surgery.  Currently still on antibiotics.  Will follow up with vascular surgery regarding need for antibiotics. -Wound care as per vascular surgery.   Continue pain management  Acute toxic encephalopathy -Now resolved.  Likely from gabapentin dose increase.  Currently on gabapentin 300 mg at night.  Chest pain, likely musculoskeletal in nature -No significant troponin elevation.  VQ scan unremarkable.  No acute chest x-ray findings. -Resolved.  No further work-up.  End-stage renal disease on hemodialysis -Nephrology following for dialysis.  Acute on chronic anemia of chronic disease -Hemoglobin 6.7 on admission.  Status post 2 units packed red cells transfusion on 02/02/2020.  Subsequently hemoglobin has remained stable.    CAD status post CABG -Continue aspirin, Plavix, pravastatin  Hypertension -Continue Lopressor  Diabetes mellitus type 2 with hyperglycemia -Continue CBGs with coverage.  A1c 5 in January 2021  Depression, anxiety -Continue Wellbutrin  Hyperlipidemia -Continue pravastatin  GERD -Continue Protonix  Tobacco abuse -Cessation counseling was done by prior hospitalist  Morbid obesity -Outpatient follow-up  Generalized conditioning -We will request PT evaluation.  PT had recommended SNF earlier  DVT prophylaxis: Subcutaneous heparin Code Status: Full Family Communication: Spoke to patient at bedside Disposition Plan: PT recommended SNF but patient is hopeful that she can transition to home with home PT soon.  Consultants: Vascular surgery  Procedures:  -aortogram with atherectomy of left SFA and popliteal arteries and stenting of left behind the knee popliteal artery on 02/06/2020.   - left great toe amputation and revision of left arm cephalic vein AV fistula on 02/07/2020 by vascular surgery  Antimicrobials:  Anti-infectives (From admission, onward)   Start     Dose/Rate Route Frequency Ordered Stop   02/02/20 1200  vancomycin (VANCOCIN) IVPB 1000 mg/200 mL premix  Status:  Discontinued     1,000 mg 200 mL/hr over 60  Minutes Intravenous Every T-Th-Sa (Hemodialysis) 01/31/20 1822 02/03/20 1052    02/02/20 0730  ceFAZolin (ANCEF) IVPB 2g/100 mL premix  Status:  Discontinued     2 g 200 mL/hr over 30 Minutes Intravenous To Radiology 02/02/20 0728 02/02/20 0806   01/31/20 2030  cefTRIAXone (ROCEPHIN) 2 g in sodium chloride 0.9 % 100 mL IVPB     2 g 200 mL/hr over 30 Minutes Intravenous Every 24 hours 01/31/20 1749     01/31/20 2000  metroNIDAZOLE (FLAGYL) IVPB 500 mg  Status:  Discontinued     500 mg 100 mL/hr over 60 Minutes Intravenous Every 8 hours 01/31/20 1749 02/03/20 1052   01/31/20 1930  vancomycin (VANCOREADY) IVPB 2000 mg/400 mL     2,000 mg 200 mL/hr over 120 Minutes Intravenous  Once 01/31/20 1822 02/01/20 0200       Subjective: Patient seen and examined at bedside.  She denies worsening foot pain.  No overnight fever or vomiting.  No worsening shortness of breath.  Objective: Vitals:   02/08/20 0500 02/08/20 0555 02/08/20 0557 02/08/20 0836  BP:  100/73  130/72  Pulse:    100  Resp:  16 15   Temp:  98.5 F (36.9 C)    TempSrc:  Oral    SpO2:  98%    Weight: 120 kg     Height:        Intake/Output Summary (Last 24 hours) at 02/08/2020 0908 Last data filed at 02/07/2020 2100 Gross per 24 hour  Intake 253 ml  Output 2000 ml  Net -1747 ml   Filed Weights   02/07/20 1125 02/07/20 1437 02/08/20 0500  Weight: 123.3 kg 120.2 kg 120 kg    Examination:  General exam: Appears calm and comfortable.  Looks chronically ill. Respiratory system: Bilateral decreased breath sounds at bases with scattered crackles Cardiovascular system: S1 & S2 heard, Rate controlled Gastrointestinal system: Abdomen is morbidly obese, nondistended, soft and nontender. Normal bowel sounds heard. Extremities: No cyanosis, clubbing; trace lower extremity edema.  Left foot dressing present.   Data Reviewed: I have personally reviewed following labs and imaging studies  CBC: Recent Labs  Lab 02/02/20 0400 02/02/20 0400 02/02/20 1524 02/03/20 0415 02/04/20 0814 02/06/20 0535  02/07/20 1122  WBC 6.9  --   --  9.2 7.4 7.5 7.6  NEUTROABS  --   --   --   --   --  4.9  --   HGB 6.7*   < > 9.1* 9.2* 8.4* 9.5* 8.3*  HCT 23.8*   < > 29.9* 30.7* 28.6* 32.8* 28.6*  MCV 87.2  --   --  85.0 88.5 90.4 90.8  PLT 358  --   --  353 324 297 256   < > = values in this interval not displayed.   Basic Metabolic Panel: Recent Labs  Lab 02/02/20 0400 02/03/20 0415 02/04/20 0814 02/06/20 0535 02/07/20 1122  NA 141 140 142 141 139  K 4.2 3.9 3.8 3.9 4.3  CL 103 100 101 104 103  CO2 24 26 25 22 22   GLUCOSE 138* 115* 80 138* 177*  BUN 52* 37* 51* 47* 67*  CREATININE 6.68* 5.09* 6.57* 5.87* 7.21*  CALCIUM 8.0* 8.4* 8.2* 8.0* 7.6*  MG  --   --   --  1.6*  --   PHOS  --   --  6.1*  --  7.0*   GFR: Estimated Creatinine Clearance: 10 mL/min (A) (by C-G formula based on SCr of 7.21  mg/dL (H)). Liver Function Tests: Recent Labs  Lab 02/04/20 0814 02/06/20 0535 02/07/20 1122  AST  --  20  --   ALT  --  12  --   ALKPHOS  --  98  --   BILITOT  --  0.3  --   PROT  --  6.1*  --   ALBUMIN 2.5* 2.7* 2.4*   No results for input(s): LIPASE, AMYLASE in the last 168 hours. No results for input(s): AMMONIA in the last 168 hours. Coagulation Profile: No results for input(s): INR, PROTIME in the last 168 hours. Cardiac Enzymes: No results for input(s): CKTOTAL, CKMB, CKMBINDEX, TROPONINI in the last 168 hours. BNP (last 3 results) No results for input(s): PROBNP in the last 8760 hours. HbA1C: No results for input(s): HGBA1C in the last 72 hours. CBG: Recent Labs  Lab 02/07/20 0916 02/07/20 1113 02/07/20 1611 02/07/20 2123 02/08/20 0620  GLUCAP 110* 139* 265* 214* 133*   Lipid Profile: No results for input(s): CHOL, HDL, LDLCALC, TRIG, CHOLHDL, LDLDIRECT in the last 72 hours. Thyroid Function Tests: No results for input(s): TSH, T4TOTAL, FREET4, T3FREE, THYROIDAB in the last 72 hours. Anemia Panel: Recent Labs    02/06/20 0535  FERRITIN 242  TIBC 209*  IRON 50     Sepsis Labs: No results for input(s): PROCALCITON, LATICACIDVEN in the last 168 hours.  Recent Results (from the past 240 hour(s))  Blood culture (routine x 2)     Status: None   Collection Time: 01/31/20 11:16 AM   Specimen: BLOOD RIGHT HAND  Result Value Ref Range Status   Specimen Description BLOOD RIGHT HAND  Final   Special Requests   Final    BOTTLES DRAWN AEROBIC ONLY Blood Culture results may not be optimal due to an inadequate volume of blood received in culture bottles   Culture   Final    NO GROWTH 5 DAYS Performed at [facility], [place] [address] [place], [place] [address]    Report Status 02/05/2020 FINAL  Final  Blood culture (routine x 2)     Status: None   Collection Time: 01/31/20 11:58 AM   Specimen: BLOOD  Result Value Ref Range Status   Specimen Description BLOOD RIGHT ANTECUBITAL  Final   Special Requests   Final    BOTTLES DRAWN AEROBIC AND ANAEROBIC Blood Culture results may not be optimal due to an inadequate volume of blood received in culture bottles   Culture   Final    NO GROWTH 5 DAYS Performed at [facility], [place] [address] [place], [place] [address]    Report Status 02/05/2020 FINAL  Final  Respiratory Panel by RT PCR (Flu A&B, Covid) - Nasopharyngeal Swab     Status: None   Collection Time: 01/31/20 12:01 PM   Specimen: Nasopharyngeal Swab  Result Value Ref Range Status   SARS Coronavirus 2 by RT PCR NEGATIVE NEGATIVE Final    Comment: (NOTE) SARS-CoV-2 target nucleic acids are NOT DETECTED. The SARS-CoV-2 RNA is generally detectable in upper respiratoy specimens during the acute phase of infection. The lowest concentration of SARS-CoV-2 viral copies this assay can detect is 131 copies/mL. A negative result does not preclude SARS-Cov-2 infection and should not be used as the sole basis for treatment or other patient management decisions. A negative result may occur with  improper specimen collection/handling, submission of  specimen other than nasopharyngeal swab, presence of viral mutation(s) within the areas targeted by this assay, and inadequate number  of viral copies (<131 copies/mL). A negative result must be combined with clinical observations, patient history, and epidemiological information. The expected result is Negative. Fact Sheet for Patients:  PinkCheek.be Fact Sheet for Healthcare Providers:  GravelBags.it This test is not yet ap proved or cleared by the [place] FDA and  has been authorized for detection and/or diagnosis of SARS-CoV-2 by FDA under an Emergency Use Authorization (EUA). This EUA will remain  in effect (meaning this test can be used) for the duration of the COVID-19 declaration under Section 564(b)(1) of the Act, 21 U.S.C. section 360bbb-3(b)(1), unless the authorization is terminated or revoked sooner.    Influenza A by PCR NEGATIVE NEGATIVE Final   Influenza B by PCR NEGATIVE NEGATIVE Final    Comment: (NOTE) The Xpert Xpress SARS-CoV-2/FLU/RSV assay is intended as an aid in  the diagnosis of influenza from Nasopharyngeal swab specimens and  should not be used as a sole basis for treatment. Nasal washings and  aspirates are unacceptable for Xpert Xpress SARS-CoV-2/FLU/RSV  testing. Fact Sheet for Patients: PinkCheek.be Fact Sheet for Healthcare Providers: GravelBags.it This test is not yet approved or cleared by the [place] FDA and  has been authorized for detection and/or diagnosis of SARS-CoV-2 by  FDA under an Emergency Use Authorization (EUA). This EUA will remain  in effect (meaning this test can be used) for the duration of the  Covid-19 declaration under Section 564(b)(1) of the Act, 21  U.S.C. section 360bbb-3(b)(1), unless the authorization is  terminated or revoked. Performed at [facility], [place] [address] [place],  [place] [address]   MRSA PCR Screening     Status: None   Collection Time: 02/01/20 12:00 AM   Specimen: Nasopharyngeal  Result Value Ref Range Status   MRSA by PCR NEGATIVE NEGATIVE Final    Comment:        The GeneXpert MRSA Assay (FDA approved for NASAL specimens only), is one component of a comprehensive MRSA colonization surveillance program. It is not intended to diagnose MRSA infection nor to guide or monitor treatment for MRSA infections. Performed at [facility], [place] [address], [place], [place] [address]   Surgical pcr screen     Status: None   Collection Time: 02/06/20 11:35 PM   Specimen: Nasal Mucosa; Nasal Swab  Result Value Ref Range Status   MRSA, PCR NEGATIVE NEGATIVE Final   Staphylococcus aureus NEGATIVE NEGATIVE Final    Comment: (NOTE) The Xpert SA Assay (FDA approved for NASAL specimens in patients 65 years of age and older), is one component of a comprehensive surveillance program. It is not intended to diagnose infection nor to guide or monitor treatment. Performed at [facility], [place] [address], [place], [place] [address]          Radiology Studies: No results found.      Scheduled Meds: . sodium chloride   Intravenous Once  . allopurinol  100 mg Oral BID  . aspirin EC  325 mg Oral Daily  . brinzolamide  1 drop Both Eyes BID  . [START ON 02/09/2020] calcitRIOL  1.75 mcg Oral Q T,Th,Sa-HD  . Chlorhexidine Gluconate Cloth  6 each Topical Q0600  . Chlorhexidine Gluconate Cloth  6 each Topical Q0600  . clopidogrel  300 mg Oral Once   Followed by  . clopidogrel  75 mg Oral Q breakfast  . [START ON 02/09/2020] darbepoetin (ARANESP) injection - DIALYSIS  200 mcg Intravenous Q Thu-HD  . feeding supplement (  NEPRO CARB STEADY)  237 mL Oral BID BM  . gabapentin  300 mg Oral QHS  . heparin  5,000 Units Subcutaneous Q8H  . insulin aspart  0-6 Units Subcutaneous TID WC  . latanoprost  1 drop Both Eyes QHS  . linaclotide  72 mcg Oral QAC  breakfast  . metoprolol tartrate  25 mg Oral BID  . mometasone-formoterol  2 puff Inhalation BID  . multivitamin  1 tablet Oral QHS  . pantoprazole  40 mg Oral Daily  . pravastatin  40 mg Oral Daily  . sodium chloride flush  3 mL Intravenous Q12H  . sodium chloride flush  3 mL Intravenous Q12H  . sucroferric oxyhydroxide  500 mg Oral TID WC   Continuous Infusions: . sodium chloride 10 mL/hr at 02/07/20 0727  . sodium chloride    . cefTRIAXone (ROCEPHIN)  IV 2 g (02/08/20 0535)  . [START ON 02/09/2020] ferric gluconate (FERRLECIT/NULECIT) IV            Aline August, MD [facility] 02/08/2020, 9:08 AM

## 2020-02-09 DIAGNOSIS — I739 Peripheral vascular disease, unspecified: Secondary | ICD-10-CM

## 2020-02-09 LAB — CBC
HCT: 28.3 % — ABNORMAL LOW (ref 36.0–46.0)
Hemoglobin: 8.5 g/dL — ABNORMAL LOW (ref 12.0–15.0)
MCH: 26.2 pg (ref 26.0–34.0)
MCHC: 30 g/dL (ref 30.0–36.0)
MCV: 87.1 fL (ref 80.0–100.0)
Platelets: 283 10*3/uL (ref 150–400)
RBC: 3.25 MIL/uL — ABNORMAL LOW (ref 3.87–5.11)
RDW: 19.6 % — ABNORMAL HIGH (ref 11.5–15.5)
WBC: 10.8 10*3/uL — ABNORMAL HIGH (ref 4.0–10.5)
nRBC: 0 % (ref 0.0–0.2)

## 2020-02-09 LAB — RENAL FUNCTION PANEL
Albumin: 2.3 g/dL — ABNORMAL LOW (ref 3.5–5.0)
Anion gap: 18 — ABNORMAL HIGH (ref 5–15)
BUN: 67 mg/dL — ABNORMAL HIGH (ref 8–23)
CO2: 21 mmol/L — ABNORMAL LOW (ref 22–32)
Calcium: 8.2 mg/dL — ABNORMAL LOW (ref 8.9–10.3)
Chloride: 94 mmol/L — ABNORMAL LOW (ref 98–111)
Creatinine, Ser: 7.57 mg/dL — ABNORMAL HIGH (ref 0.44–1.00)
GFR calc Af Amer: 6 mL/min — ABNORMAL LOW (ref >60)
GFR calc non Af Amer: 5 mL/min — ABNORMAL LOW (ref >60)
Glucose, Bld: 224 mg/dL — ABNORMAL HIGH (ref 70–99)
Phosphorus: 8.5 mg/dL — ABNORMAL HIGH (ref 2.5–4.6)
Potassium: 4.9 mmol/L (ref 3.5–5.1)
Sodium: 133 mmol/L — ABNORMAL LOW (ref 135–145)

## 2020-02-09 LAB — GLUCOSE, CAPILLARY
Glucose-Capillary: 175 mg/dL — ABNORMAL HIGH (ref 70–99)
Glucose-Capillary: 186 mg/dL — ABNORMAL HIGH (ref 70–99)
Glucose-Capillary: 83 mg/dL (ref 70–99)
Glucose-Capillary: 287 mg/dL — ABNORMAL HIGH (ref 70–99)

## 2020-02-09 LAB — OCCULT BLOOD X 1 CARD TO LAB, STOOL: Fecal Occult Bld: POSITIVE — AB

## 2020-02-09 LAB — SARS CORONAVIRUS 2 (TAT 6-24 HRS): SARS Coronavirus 2: NEGATIVE

## 2020-02-09 MED ORDER — DARBEPOETIN ALFA 200 MCG/0.4ML IJ SOSY
PREFILLED_SYRINGE | INTRAMUSCULAR | Status: AC
  Filled 2020-02-09: qty 0.4

## 2020-02-09 MED ORDER — HEPARIN SODIUM (PORCINE) 1000 UNIT/ML IJ SOLN
INTRAMUSCULAR | Status: AC
  Administered 2020-02-09: 3000 [IU] via INTRAVENOUS_CENTRAL
  Filled 2020-02-09: qty 3

## 2020-02-09 MED ORDER — SUCROFERRIC OXYHYDROXIDE 500 MG PO CHEW
1000.0000 mg | CHEWABLE_TABLET | Freq: Three times a day (TID) | ORAL | Status: DC
  Administered 2020-02-09 – 2020-02-10 (×3): 1000 mg via ORAL
  Filled 2020-02-09 (×5): qty 2

## 2020-02-09 NOTE — Progress Notes (Signed)
CCMD called couple times, reported monitor alarmed due to Pt had short unsustained 2nd degree AV block and dropped beats. Pt's sleeping asymptomatically. HR 80s-90s, Mostly of her EKG was NSR. BP 124/78 mmHg, SPO2 98-100% on room air, remained afebrile. Left   AV graft with skin glue, red and warm touched, thrill and bruit, no drainage, no pain on left arm..  Left foot dressing dry and clean, no drainage. Pain scale 7-8/10 on left foot. Pain med Oxycodone given at bed time. Tolerated well. Left and right DP and PT pulses detected with doppler.Continue to monitor,  [place], R

## 2020-02-09 NOTE — Progress Notes (Signed)
Pt woke up with confusion, disoriented to time and situations, and had large bowel movement smeared everywhere on her bed and on her body. Pt's stated " I have no idea what's happening".   Left foot dressing changed, CHG bath given.  Her stool had some red tint and blood clotted. Stool occult blood sent to lab, result pending.   BP 99/58 mmHg, HR 83, sinus rhythm with occasionally PAC and 2nd degree AVB per CCMD reported.  SPO2 99% on room air, RR 12-22, Oral temp 99.6 F,  Continue to monitor. Kennyth Lose, RN

## 2020-02-09 NOTE — Progress Notes (Signed)
Fecal occult blood positive. Notified on-call provider, Tylene Fantasia, NP. Order received for stat CBC, hold heparin SQ this am, and put SCD instead. Pt had no immediate distress noted at this moment. We will continue to monitor.  Kennyth Lose, RN

## 2020-02-09 NOTE — Progress Notes (Signed)
Subjective:  Sitting up eating brk  For hd today  Objective Vital signs in last 24 hours: Vitals:   02/09/20 1300 02/09/20 1330 02/09/20 1400 02/09/20 1431  BP: 106/60 106/61 117/65 122/70  Pulse: 93 85 87 83  Resp: 16 16  18   Temp:      TempSrc:      SpO2:      Weight:      Height:       Weight change: -2.4 kg  Physical Exam: General:Alert obese Female NAD OX3 Heart:RRR Lungs:CTA, unlabored breathing Abdomen:Obese, soft, NT, ND Extremities:Trace pedal edema Left with Bandaged Left foot clean /dry ,  Dialysis Access:perm cath/ LUA AVF pos bruit  With surg inscn. clean /dry     Dialysis Orders:[facility] T,Th,S   4h 65min  118kg  2/2 bath  TDC/ LUA AVF  Hep none -Mircera 225 mcg IV q 2 weeks( LAST ON 01/26/20) -Venofer 100 mg IVqtx through 2/11 -Calcitriol 1.3mcg PO TIW   Problem/Plan: 1. Left grt toe Diabetic Ulcer = wu per admitand VVS. LLE perc revasc L SFA/ popliteal arteries on 02/06/20. Now is SP L great toe amp on 2/9 perDr Donzetta Matters. Abx x 3 > down to Rocephin now. MRI neg for osteo.  2. ESRD - T,Th,S via TDC. K+4.9 , No heparin. 3. LUA AVF malfunction= using Perm cath , sp  Dr Donzetta Matters Revision of left arm cephalic vein AV fistula 2/9 with transposition 4. Acute Hypoxic Respiratory failure/Volume Overload: resolved w/ HD 5. Chest pain: per primary, recent similar presentation.-resolved after admit hd uf 6. Hypertension/volume -bp stable/ hd UF  as tolerated.  7. Anemia - HGB9.5>8.3 this am/ Recent max225 MCGESA dose due NEXT ON 2/11). Continue Fe load.  8. Metabolic bone disease - Calcitriol 1.75 TIW.Phos6.1>8.5  Velphoro 1 incr 2 ac Ca8.0Ok  9. Nutrition -alb 2.7carb mod /Renal diet/ renal vit 10. Tobacco ABUSE - ongoing problem ,dw need to stop . Currently no plan 11. Debilitation = she requests NHP/ Admit rx  [name], PA-C [facility] [phone] 02/09/2020,2:47 PM  LOS: 9 days    Labs: Basic Metabolic Panel: Recent Labs  Lab 02/04/20 0814 02/04/20 0814 02/06/20 0535 02/07/20 1122 02/09/20 1035  NA 142   < > 141 139 133*  K 3.8   < > 3.9 4.3 4.9  CL 101   < > 104 103 94*  CO2 25   < > 22 22 21*  GLUCOSE 80   < > 138* 177* 224*  BUN 51*   < > 47* 67* 67*  CREATININE 6.57*   < > 5.87* 7.21* 7.57*  CALCIUM 8.2*   < > 8.0* 7.6* 8.2*  PHOS 6.1*  --   --  7.0* 8.5*   < > = values in this interval not displayed.   Liver Function Tests: Recent Labs  Lab 02/06/20 0535 02/07/20 1122 02/09/20 1035  AST 20  --   --   ALT 12  --   --   ALKPHOS 98  --   --   BILITOT 0.3  --   --   PROT 6.1*  --   --   ALBUMIN 2.7* 2.4* 2.3*   No results for input(s): LIPASE, AMYLASE in the last 168 hours. No results for input(s): AMMONIA in the last 168 hours. CBC: Recent Labs  Lab 02/03/20 0415 02/03/20 0415 02/04/20 0814 02/04/20 0814 02/06/20 0535 02/07/20 1122 02/09/20 0518  WBC 9.2   < > 7.4   < >  7.5 7.6 10.8*  NEUTROABS  --   --   --   --  4.9  --   --   HGB 9.2*   < > 8.4*   < > 9.5* 8.3* 8.5*  HCT 30.7*   < > 28.6*   < > 32.8* 28.6* 28.3*  MCV 85.0  --  88.5  --  90.4 90.8 87.1  PLT 353   < > 324   < > 297 256 283   < > = values in this interval not displayed.   Cardiac Enzymes: No results for input(s): CKTOTAL, CKMB, CKMBINDEX, TROPONINI in the last 168 hours. CBG: Recent Labs  Lab 02/08/20 0620 02/08/20 1116 02/08/20 1647 02/08/20 2128 02/09/20 1106  GLUCAP 133* 200* 159* 208* 186*    Studies/Results: No results found. Medications: . sodium chloride 10 mL/hr at 02/07/20 0727  . sodium chloride    . ferric gluconate (FERRLECIT/NULECIT) IV     . sodium chloride   Intravenous Once  . allopurinol  100 mg Oral BID  . aspirin EC  325 mg Oral Daily  . brinzolamide  1 drop Both Eyes BID  . calcitRIOL  1.75 mcg Oral Q T,Th,Sa-HD  . Chlorhexidine Gluconate Cloth  6 each Topical Q0600  . Chlorhexidine Gluconate Cloth  6 each Topical Q0600   . Chlorhexidine Gluconate Cloth  6 each Topical Q0600  . clopidogrel  300 mg Oral Once   Followed by  . clopidogrel  75 mg Oral Q breakfast  . darbepoetin (ARANESP) injection - DIALYSIS  200 mcg Intravenous Q Thu-HD  . feeding supplement (NEPRO CARB STEADY)  237 mL Oral BID BM  . gabapentin  300 mg Oral QHS  . insulin aspart  0-6 Units Subcutaneous TID WC  . latanoprost  1 drop Both Eyes QHS  . linaclotide  72 mcg Oral QAC breakfast  . metoprolol tartrate  25 mg Oral BID  . mometasone-formoterol  2 puff Inhalation BID  . multivitamin  1 tablet Oral QHS  . pantoprazole  40 mg Oral Daily  . pravastatin  40 mg Oral Daily  . sodium chloride flush  3 mL Intravenous Q12H  . sodium chloride flush  3 mL Intravenous Q12H  . sucroferric oxyhydroxide  500 mg Oral TID WC     \

## 2020-02-09 NOTE — Anesthesia Postprocedure Evaluation (Signed)
Anesthesia Post Note  Patient: [name] [name]  Procedure(s) Performed: REVISON OF ARTERIOVENOUS FISTULA (Left Arm Lower) AMPUTATION DIGIT - LEFT GREAT TOE (Left Toe)     Patient location during evaluation: PACU Anesthesia Type: MAC Level of consciousness: awake and alert Pain management: pain level controlled Vital Signs Assessment: post-procedure vital signs reviewed and stable Respiratory status: spontaneous breathing, nonlabored ventilation, respiratory function stable and patient connected to nasal cannula oxygen Cardiovascular status: stable and blood pressure returned to baseline Postop Assessment: no apparent nausea or vomiting Anesthetic complications: no    Last Vitals:  Vitals:   02/09/20 0400 02/09/20 0732  BP:    Pulse:    Resp: 12   Temp:    SpO2:  98%    Last Pain:  Vitals:   02/09/20 0328  TempSrc: Oral  PainSc: 0-No pain                 Loralye Loberg

## 2020-02-09 NOTE — Progress Notes (Addendum)
  Progress Note    02/09/2020 7:33 AM 2 Days Post-Op  Subjective:  Says her toe amp has some pain this morning  Tm 99.6  Vitals:   02/09/20 0328 02/09/20 0400  BP: (!) 99/58   Pulse: 83   Resp: 13 12  Temp: 99.6 F (37.6 C)   SpO2: 99%     Physical Exam: General:  No distress Lungs:  Non labored Incisions:  Left great toe amp is clean-small blister present medially; Small, soft hematoma proximal to distal incision.  There is mild ecchymosis around both incisions. Extremities:  Left foot warm; +thrill left upper arm fistula; motor and sensory in tact.   CBC    Component Value Date/Time   WBC 10.8 (H) 02/09/2020 0518   RBC 3.25 (L) 02/09/2020 0518   HGB 8.5 (L) 02/09/2020 0518   HCT 28.3 (L) 02/09/2020 0518   HCT 22.0 (L) 10/06/2019 0021   PLT 283 02/09/2020 0518   MCV 87.1 02/09/2020 0518   MCH 26.2 02/09/2020 0518   MCHC 30.0 02/09/2020 0518   RDW 19.6 (H) 02/09/2020 0518   LYMPHSABS 1.4 02/06/2020 0535   MONOABS 0.8 02/06/2020 0535   EOSABS 0.2 02/06/2020 0535   BASOSABS 0.0 02/06/2020 0535    BMET    Component Value Date/Time   NA 139 02/07/2020 1122   K 4.3 02/07/2020 1122   CL 103 02/07/2020 1122   CO2 22 02/07/2020 1122   GLUCOSE 177 (H) 02/07/2020 1122   BUN 67 (H) 02/07/2020 1122   CREATININE 7.21 (H) 02/07/2020 1122   CREATININE 2.62 (H) 04/29/2016 1629   CALCIUM 7.6 (L) 02/07/2020 1122   GFRNONAA 5 (L) 02/07/2020 1122   GFRAA 6 (L) 02/07/2020 1122    INR    Component Value Date/Time   INR 1.2 08/25/2019 0433     Intake/Output Summary (Last 24 hours) at 02/09/2020 1610 Last data filed at 02/09/2020 0400 Gross per 24 hour  Intake 790 ml  Output 225 ml  Net 565 ml     Assessment:  69 y.o. female is s/p:  revision of left arm cephalic vein AV fistula with transposition and Left great toe amputation  2 Days Post-Op  Plan: -pt left foot warm; incision for great toe amp is clean-small blister present medially-will continue to watch.   Discussed with pt heel weight bearing only with Darco shoe.   She understands.  -left arm fistula with good thrill.  Small, soft hematoma proximal to distal incision.  There is mild ecchymosis around both incisions.  -PT/OT recommending SNF   Leontine Locket, PA-C [facility] [phone] 02/09/2020 7:33 AM  I have independently interviewed and examined patient and agree with PA assessment and plan above.   Andriy Sherk C. Donzetta Matters, MD [facility] [place] Office: [phone] Pager: [phone]

## 2020-02-09 NOTE — Progress Notes (Signed)
Patient ID: [name] [name], female   DOB: 18-Nov-1951, 69 y.o.   MRN: [id number]  PROGRESS NOTE    [name] [name]  DJM:[id number] DOB: 02/13/1951 DOA: 01/31/2020 PCP: [facility]   Brief Narrative:  69 year old female with history of CAD status post CABG, combined systolic and diastolic CHF, PVD, end-stage renal disease on hemodialysis, diabetes mellitus type 2, hyperparathyroidism, asthma, polysubstance abuse, tobacco abuse presented on 01/31/2020 with chest pain and dyspnea along with severe left great toe pain.  On presentation, chest x-ray showed cardiomegaly with vascular congestion.  X-ray of the left foot revealed ulceration of the left first digit without evidence of osteomyelitis.  She was admitted and started on broad-spectrum antibiotics.  Vascular surgery and nephrology were consulted.  She underwent aortogram with atherectomy of left SFA and popliteal arteries and stenting of left behind the knee popliteal artery on 02/06/2020.  She then underwent left great toe amputation and revision of left arm cephalic vein AV fistula on 02/07/2020 by vascular surgery.  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  MRI showed focal area of ulceration in the first digit distal tuft, no sinus tract or absces without definite evidence of osteomyelitis -Was empirically started on vancomycin, Rocephin and Flagyl and subsequently switched to Rocephin alone.  Cultures have been negative so far. -She underwent aortogram with atherectomy of left SFA and popliteal arteries and stenting of left behind the knee popliteal artery on 02/06/2020.   -She then underwent left great toe amputation and revision of left arm cephalic vein AV fistula on 02/07/2020 by vascular surgery.  Antibiotics discontinued on 02/08/2020 after clearance from vascular surgery. -Wound care as per vascular surgery.  Continue pain  management  Acute toxic encephalopathy -Now resolved.  Likely from gabapentin dose increase.  Currently on gabapentin 300 mg at night.  Chest pain, likely musculoskeletal in nature -No significant troponin elevation.  VQ scan unremarkable.  No acute chest x-ray findings. -Resolved.  No further work-up.  End-stage renal disease on hemodialysis -Nephrology following for dialysis.  Acute on chronic anemia of chronic disease -Hemoglobin 6.7 on admission.  Status post 2 units packed red cells transfusion on 02/02/2020.  Subsequently hemoglobin has remained stable.  8.5 this morning.  CAD status post CABG -Continue aspirin, Plavix, pravastatin  Hypertension -Continue Lopressor  Diabetes mellitus type 2 with hyperglycemia -Continue CBGs with coverage.  A1c 5 in January 2021  Depression, anxiety -Continue Wellbutrin  Hyperlipidemia -Continue pravastatin  GERD -Continue Protonix  Tobacco abuse -Cessation counseling was done by prior hospitalist  Morbid obesity -Outpatient follow-up  Generalized conditioning -PT is recommending SNF placement.  DVT prophylaxis: Subcutaneous heparin discontinued well this morning because of slight blood streaking in the stool and occult blood positive  stool.   Code Status: Full Family Communication: Spoke to patient at bedside Disposition Plan: SNF once bed is available.  Currently medically stable for discharge. Consultants: Vascular surgery  Procedures:  -aortogram with atherectomy of left SFA and popliteal arteries and stenting of left behind the knee popliteal artery on 02/06/2020.   - left great toe amputation and revision of left arm cephalic vein AV fistula on 02/07/2020 by vascular surgery  Antimicrobials:  Anti-infectives (From admission, onward)   Start     Dose/Rate Route Frequency Ordered Stop   02/02/20 1200  vancomycin (VANCOCIN) IVPB 1000 mg/200 mL premix  Status:  Discontinued     1,000 mg 200  mL/hr over 60 Minutes Intravenous  Every T-Th-Sa (Hemodialysis) 01/31/20 1822 02/03/20 1052   02/02/20 0730  ceFAZolin (ANCEF) IVPB 2g/100 mL premix  Status:  Discontinued     2 g 200 mL/hr over 30 Minutes Intravenous To Radiology 02/02/20 0728 02/02/20 0806   01/31/20 2030  cefTRIAXone (ROCEPHIN) 2 g in sodium chloride 0.9 % 100 mL IVPB  Status:  Discontinued     2 g 200 mL/hr over 30 Minutes Intravenous Every 24 hours 01/31/20 1749 02/08/20 0931   01/31/20 2000  metroNIDAZOLE (FLAGYL) IVPB 500 mg  Status:  Discontinued     500 mg 100 mL/hr over 60 Minutes Intravenous Every 8 hours 01/31/20 1749 02/03/20 1052   01/31/20 1930  vancomycin (VANCOREADY) IVPB 2000 mg/400 mL     2,000 mg 200 mL/hr over 120 Minutes Intravenous  Once 01/31/20 1822 02/01/20 0200       Subjective: Patient seen and examined at bedside.  No overnight fever, vomiting or worsening shortness of breath reported.  Nursing staff reported that patient had slightly blood streaked stool earlier this morning.  Objective: Vitals:   02/08/20 1948 02/09/20 0328 02/09/20 0400 02/09/20 0732  BP:  (!) 99/58    Pulse:  83    Resp:  13 12   Temp:  99.6 F (37.6 C)    TempSrc:  Oral    SpO2: 99% 99%  98%  Weight:  120.9 kg    Height:        Intake/Output Summary (Last 24 hours) at 02/09/2020 0752 Last data filed at 02/09/2020 0400 Gross per 24 hour  Intake 790 ml  Output 225 ml  Net 565 ml   Filed Weights   02/07/20 1437 02/08/20 0500 02/09/20 0328  Weight: 120.2 kg 120 kg 120.9 kg    Examination:  General exam: No acute distress.  Looks chronically ill. Respiratory system: Bilateral decreased breath sounds at bases with some crackles.   Cardiovascular system: S1-S2 heard, rate controlled Gastrointestinal system: Abdomen is morbidly obese, nondistended, soft and nontender.  Bowel sounds heard Extremities: No clubbing or cyanosis; trace lower extremity edema.  Left foot dressing present.   Data Reviewed: I have personally reviewed following labs  and imaging studies  CBC: Recent Labs  Lab 02/03/20 0415 02/04/20 0814 02/06/20 0535 02/07/20 1122 02/09/20 0518  WBC 9.2 7.4 7.5 7.6 10.8*  NEUTROABS  --   --  4.9  --   --   HGB 9.2* 8.4* 9.5* 8.3* 8.5*  HCT 30.7* 28.6* 32.8* 28.6* 28.3*  MCV 85.0 88.5 90.4 90.8 87.1  PLT 353 324 297 256 570   Basic Metabolic Panel: Recent Labs  Lab 02/03/20 0415 02/04/20 0814 02/06/20 0535 02/07/20 1122  NA 140 142 141 139  K 3.9 3.8 3.9 4.3  CL 100 101 104 103  CO2 26 25 22 22   GLUCOSE 115* 80 138* 177*  BUN 37* 51* 47* 67*  CREATININE 5.09* 6.57* 5.87* 7.21*  CALCIUM 8.4* 8.2* 8.0* 7.6*  MG  --   --  1.6*  --   PHOS  --  6.1*  --  7.0*   GFR: Estimated Creatinine Clearance: 10.1 mL/min (A) (by C-G formula based on SCr of 7.21 mg/dL (H)). Liver Function Tests: Recent Labs  Lab 02/04/20 0814 02/06/20 0535 02/07/20 1122  AST  --  20  --   ALT  --  12  --   ALKPHOS  --  98  --   BILITOT  --  0.3  --  PROT  --  6.1*  --   ALBUMIN 2.5* 2.7* 2.4*   No results for input(s): LIPASE, AMYLASE in the last 168 hours. No results for input(s): AMMONIA in the last 168 hours. Coagulation Profile: No results for input(s): INR, PROTIME in the last 168 hours. Cardiac Enzymes: No results for input(s): CKTOTAL, CKMB, CKMBINDEX, TROPONINI in the last 168 hours. BNP (last 3 results) No results for input(s): PROBNP in the last 8760 hours. HbA1C: No results for input(s): HGBA1C in the last 72 hours. CBG: Recent Labs  Lab 02/07/20 2123 02/08/20 0620 02/08/20 1116 02/08/20 1647 02/08/20 2128  GLUCAP 214* 133* 200* 159* 208*   Lipid Profile: No results for input(s): CHOL, HDL, LDLCALC, TRIG, CHOLHDL, LDLDIRECT in the last 72 hours. Thyroid Function Tests: No results for input(s): TSH, T4TOTAL, FREET4, T3FREE, THYROIDAB in the last 72 hours. Anemia Panel: No results for input(s): VITAMINB12, FOLATE, FERRITIN, TIBC, IRON, RETICCTPCT in the last 72 hours. Sepsis Labs: No results for  input(s): PROCALCITON, LATICACIDVEN in the last 168 hours.  Recent Results (from the past 240 hour(s))  Blood culture (routine x 2)     Status: None   Collection Time: 01/31/20 11:16 AM   Specimen: BLOOD RIGHT HAND  Result Value Ref Range Status   Specimen Description BLOOD RIGHT HAND  Final   Special Requests   Final    BOTTLES DRAWN AEROBIC ONLY Blood Culture results may not be optimal due to an inadequate volume of blood received in culture bottles   Culture   Final    NO GROWTH 5 DAYS Performed at [facility], [place] [address], [place], [place] [address]    Report Status 02/05/2020 FINAL  Final  Blood culture (routine x 2)     Status: None   Collection Time: 01/31/20 11:58 AM   Specimen: BLOOD  Result Value Ref Range Status   Specimen Description BLOOD RIGHT ANTECUBITAL  Final   Special Requests   Final    BOTTLES DRAWN AEROBIC AND ANAEROBIC Blood Culture results may not be optimal due to an inadequate volume of blood received in culture bottles   Culture   Final    NO GROWTH 5 DAYS Performed at [facility], [place] [address] [place], [place] [address]    Report Status 02/05/2020 FINAL  Final  Respiratory Panel by RT PCR (Flu A&B, Covid) - Nasopharyngeal Swab     Status: None   Collection Time: 01/31/20 12:01 PM   Specimen: Nasopharyngeal Swab  Result Value Ref Range Status   SARS Coronavirus 2 by RT PCR NEGATIVE NEGATIVE Final    Comment: (NOTE) SARS-CoV-2 target nucleic acids are NOT DETECTED. The SARS-CoV-2 RNA is generally detectable in upper respiratoy specimens during the acute phase of infection. The lowest concentration of SARS-CoV-2 viral copies this assay can detect is 131 copies/mL. A negative result does not preclude SARS-Cov-2 infection and should not be used as the sole basis for treatment or other patient management decisions. A negative result may occur with  improper specimen collection/handling, submission of specimen other than nasopharyngeal  swab, presence of viral mutation(s) within the areas targeted by this assay, and inadequate number of viral copies (<131 copies/mL). A negative result must be combined with clinical observations, patient history, and epidemiological information. The expected result is Negative. Fact Sheet for Patients:  PinkCheek.be Fact Sheet for Healthcare Providers:  GravelBags.it This test is not yet ap proved or cleared by the [place] and  has been authorized  for detection and/or diagnosis of SARS-CoV-2 by FDA under an Emergency Use Authorization (EUA). This EUA will remain  in effect (meaning this test can be used) for the duration of the COVID-19 declaration under Section 564(b)(1) of the Act, 21 U.S.C. section 360bbb-3(b)(1), unless the authorization is terminated or revoked sooner.    Influenza A by PCR NEGATIVE NEGATIVE Final   Influenza B by PCR NEGATIVE NEGATIVE Final    Comment: (NOTE) The Xpert Xpress SARS-CoV-2/FLU/RSV assay is intended as an aid in  the diagnosis of influenza from Nasopharyngeal swab specimens and  should not be used as a sole basis for treatment. Nasal washings and  aspirates are unacceptable for Xpert Xpress SARS-CoV-2/FLU/RSV  testing. Fact Sheet for Patients: PinkCheek.be Fact Sheet for Healthcare Providers: GravelBags.it This test is not yet approved or cleared by the [place] FDA and  has been authorized for detection and/or diagnosis of SARS-CoV-2 by  FDA under an Emergency Use Authorization (EUA). This EUA will remain  in effect (meaning this test can be used) for the duration of the  Covid-19 declaration under Section 564(b)(1) of the Act, 21  U.S.C. section 360bbb-3(b)(1), unless the authorization is  terminated or revoked. Performed at [facility], [place] [address] [place], [place] [address]   MRSA PCR Screening      Status: None   Collection Time: 02/01/20 12:00 AM   Specimen: Nasopharyngeal  Result Value Ref Range Status   MRSA by PCR NEGATIVE NEGATIVE Final    Comment:        The GeneXpert MRSA Assay (FDA approved for NASAL specimens only), is one component of a comprehensive MRSA colonization surveillance program. It is not intended to diagnose MRSA infection nor to guide or monitor treatment for MRSA infections. Performed at [facility], [place] [address], [place], [place] [address]   Surgical pcr screen     Status: None   Collection Time: 02/06/20 11:35 PM   Specimen: Nasal Mucosa; Nasal Swab  Result Value Ref Range Status   MRSA, PCR NEGATIVE NEGATIVE Final   Staphylococcus aureus NEGATIVE NEGATIVE Final    Comment: (NOTE) The Xpert SA Assay (FDA approved for NASAL specimens in patients 29 years of age and older), is one component of a comprehensive surveillance program. It is not intended to diagnose infection nor to guide or monitor treatment. Performed at [facility], [place] [address], [place], [place] [address]          Radiology Studies: No results found.      Scheduled Meds: . sodium chloride   Intravenous Once  . allopurinol  100 mg Oral BID  . aspirin EC  325 mg Oral Daily  . brinzolamide  1 drop Both Eyes BID  . calcitRIOL  1.75 mcg Oral Q T,Th,Sa-HD  . Chlorhexidine Gluconate Cloth  6 each Topical Q0600  . Chlorhexidine Gluconate Cloth  6 each Topical Q0600  . Chlorhexidine Gluconate Cloth  6 each Topical Q0600  . clopidogrel  300 mg Oral Once   Followed by  . clopidogrel  75 mg Oral Q breakfast  . darbepoetin (ARANESP) injection - DIALYSIS  200 mcg Intravenous Q Thu-HD  . feeding supplement (NEPRO CARB STEADY)  237 mL Oral BID BM  . gabapentin  300 mg Oral QHS  . insulin aspart  0-6 Units Subcutaneous TID WC  . latanoprost  1 drop Both Eyes QHS  . linaclotide  72 mcg Oral QAC breakfast  . metoprolol tartrate  25 mg  Oral BID  .  mometasone-formoterol  2 puff Inhalation BID  . multivitamin  1 tablet Oral QHS  . pantoprazole  40 mg Oral Daily  . pravastatin  40 mg Oral Daily  . sodium chloride flush  3 mL Intravenous Q12H  . sodium chloride flush  3 mL Intravenous Q12H  . sucroferric oxyhydroxide  500 mg Oral TID WC   Continuous Infusions: . sodium chloride 10 mL/hr at 02/07/20 0727  . sodium chloride    . ferric gluconate (FERRLECIT/NULECIT) IV            Aline August, MD [facility] 02/09/2020, 7:52 AM

## 2020-02-10 LAB — BASIC METABOLIC PANEL
Anion gap: 14 (ref 5–15)
BUN: 42 mg/dL — ABNORMAL HIGH (ref 8–23)
CO2: 25 mmol/L (ref 22–32)
Calcium: 8 mg/dL — ABNORMAL LOW (ref 8.9–10.3)
Chloride: 98 mmol/L (ref 98–111)
Creatinine, Ser: 5.89 mg/dL — ABNORMAL HIGH (ref 0.44–1.00)
GFR calc Af Amer: 8 mL/min — ABNORMAL LOW (ref >60)
GFR calc non Af Amer: 7 mL/min — ABNORMAL LOW (ref >60)
Glucose, Bld: 164 mg/dL — ABNORMAL HIGH (ref 70–99)
Potassium: 4.6 mmol/L (ref 3.5–5.1)
Sodium: 137 mmol/L (ref 135–145)

## 2020-02-10 LAB — CBC WITH DIFFERENTIAL/PLATELET
Abs Immature Granulocytes: 0.05 10*3/uL (ref 0.00–0.07)
Basophils Absolute: 0 10*3/uL (ref 0.0–0.1)
Basophils Relative: 1 %
Eosinophils Absolute: 0.3 10*3/uL (ref 0.0–0.5)
Eosinophils Relative: 3 %
HCT: 27.5 % — ABNORMAL LOW (ref 36.0–46.0)
Hemoglobin: 8.1 g/dL — ABNORMAL LOW (ref 12.0–15.0)
Immature Granulocytes: 1 %
Lymphocytes Relative: 15 %
Lymphs Abs: 1.2 10*3/uL (ref 0.7–4.0)
MCH: 26 pg (ref 26.0–34.0)
MCHC: 29.5 g/dL — ABNORMAL LOW (ref 30.0–36.0)
MCV: 88.1 fL (ref 80.0–100.0)
Monocytes Absolute: 1.2 10*3/uL — ABNORMAL HIGH (ref 0.1–1.0)
Monocytes Relative: 14 %
Neutro Abs: 5.5 10*3/uL (ref 1.7–7.7)
Neutrophils Relative %: 66 %
Platelets: 261 10*3/uL (ref 150–400)
RBC: 3.12 MIL/uL — ABNORMAL LOW (ref 3.87–5.11)
RDW: 19.6 % — ABNORMAL HIGH (ref 11.5–15.5)
WBC: 8.2 10*3/uL (ref 4.0–10.5)
nRBC: 0 % (ref 0.0–0.2)

## 2020-02-10 LAB — GLUCOSE, CAPILLARY
Glucose-Capillary: 173 mg/dL — ABNORMAL HIGH (ref 70–99)
Glucose-Capillary: 180 mg/dL — ABNORMAL HIGH (ref 70–99)
Glucose-Capillary: 201 mg/dL — ABNORMAL HIGH (ref 70–99)

## 2020-02-10 MED ORDER — CLOPIDOGREL BISULFATE 75 MG PO TABS: 75.0000 mg | ORAL_TABLET | Freq: Every day | ORAL | 0 refills | Status: DC

## 2020-02-10 MED ORDER — METOPROLOL TARTRATE 25 MG PO TABS: 25.0000 mg | ORAL_TABLET | Freq: Two times a day (BID) | ORAL | Status: AC

## 2020-02-10 MED ORDER — POLYETHYLENE GLYCOL 3350 17 G PO PACK: 17.0000 g | PACK | Freq: Every day | ORAL | 0 refills | Status: AC | PRN

## 2020-02-10 MED ORDER — INSULIN ASPART 100 UNIT/ML ~~LOC~~ SOLN: 2.0000 [IU] | Freq: Three times a day (TID) | SUBCUTANEOUS | Status: DC

## 2020-02-10 MED ORDER — OXYCODONE HCL 5 MG PO TABS: 5.0000 mg | ORAL_TABLET | Freq: Four times a day (QID) | ORAL | 0 refills | Status: DC | PRN

## 2020-02-10 MED ORDER — RENA-VITE PO TABS: 1.0000 | ORAL_TABLET | Freq: Every day | ORAL | 0 refills | Status: DC

## 2020-02-10 MED ORDER — VELPHORO 500 MG PO CHEW: 1000.0000 mg | CHEWABLE_TABLET | Freq: Three times a day (TID) | ORAL | 0 refills | Status: AC

## 2020-02-10 MED ORDER — GABAPENTIN 300 MG PO CAPS: 300.0000 mg | ORAL_CAPSULE | Freq: Every day | ORAL | Status: AC

## 2020-02-10 NOTE — Progress Notes (Signed)
[facility] Progress Note   Subjective:  Seen in room. Some pain in foot. For discharge to SNF today.   Objective Vitals:   02/10/20 0615 02/10/20 0639 02/10/20 0835 02/10/20 1238  BP: (!) 95/52 109/81 (!) 110/49 (!) 120/56  Pulse: 62  (!) 57 61  Resp:   15 16  Temp:   98.3 F (36.8 C) 99.3 F (37.4 C)  TempSrc:   Axillary Oral  SpO2: 97%  97% 95%  Weight:      Height:        Weight change: 0.6 kg  120 kg  Additional Objective Labs: Basic Metabolic Panel: Recent Labs  Lab 02/04/20 0814 02/06/20 0535 02/07/20 1122 02/09/20 1035 02/10/20 0233  NA 142   < > 139 133* 137  K 3.8   < > 4.3 4.9 4.6  CL 101   < > 103 94* 98  CO2 25   < > 22 21* 25  GLUCOSE 80   < > 177* 224* 164*  BUN 51*   < > 67* 67* 42*  CREATININE 6.57*   < > 7.21* 7.57* 5.89*  CALCIUM 8.2*   < > 7.6* 8.2* 8.0*  PHOS 6.1*  --  7.0* 8.5*  --    < > = values in this interval not displayed.   CBC: Recent Labs  Lab 02/04/20 0814 02/04/20 0814 02/06/20 0535 02/06/20 0535 02/07/20 1122 02/09/20 0518 02/10/20 0233  WBC 7.4   < > 7.5   < > 7.6 10.8* 8.2  NEUTROABS  --   --  4.9  --   --   --  5.5  HGB 8.4*   < > 9.5*   < > 8.3* 8.5* 8.1*  HCT 28.6*   < > 32.8*   < > 28.6* 28.3* 27.5*  MCV 88.5  --  90.4  --  90.8 87.1 88.1  PLT 324   < > 297   < > 256 283 261   < > = values in this interval not displayed.   Blood Culture    Component Value Date/Time   SDES BLOOD RIGHT ANTECUBITAL 01/31/2020 1158   SPECREQUEST  01/31/2020 1158    BOTTLES DRAWN AEROBIC AND ANAEROBIC Blood Culture results may not be optimal due to an inadequate volume of blood received in culture bottles   CULT  01/31/2020 1158    NO GROWTH 5 DAYS Performed at [facility], [place] [address], [place], [place] [address]    REPTSTATUS 02/05/2020 FINAL 01/31/2020 1158     Physical Exam General: WNWND female alert, nad  Heart: RRR  Lungs: clear bilaterally  Abdomen: soft nontender  Extremities: Trace  LE edema; Left foot bandaged  Dialysis Access:  LUE AVF +bruit   Medications: . sodium chloride 10 mL/hr at 02/07/20 0727  . sodium chloride    . ferric gluconate (FERRLECIT/NULECIT) IV     . sodium chloride   Intravenous Once  . allopurinol  100 mg Oral BID  . aspirin EC  325 mg Oral Daily  . brinzolamide  1 drop Both Eyes BID  . calcitRIOL  1.75 mcg Oral Q T,Th,Sa-HD  . Chlorhexidine Gluconate Cloth  6 each Topical Q0600  . Chlorhexidine Gluconate Cloth  6 each Topical Q0600  . Chlorhexidine Gluconate Cloth  6 each Topical Q0600  . clopidogrel  300 mg Oral Once   Followed by  . clopidogrel  75 mg Oral Q breakfast  . darbepoetin (ARANESP) injection - DIALYSIS  200 mcg Intravenous Q Thu-HD  . feeding supplement (NEPRO CARB STEADY)  237 mL Oral BID BM  . gabapentin  300 mg Oral QHS  . insulin aspart  0-6 Units Subcutaneous TID WC  . latanoprost  1 drop Both Eyes QHS  . linaclotide  72 mcg Oral QAC breakfast  . metoprolol tartrate  25 mg Oral BID  . mometasone-formoterol  2 puff Inhalation BID  . multivitamin  1 tablet Oral QHS  . pantoprazole  40 mg Oral Daily  . pravastatin  40 mg Oral Daily  . sodium chloride flush  3 mL Intravenous Q12H  . sodium chloride flush  3 mL Intravenous Q12H  . sucroferric oxyhydroxide  1,000 mg Oral TID WC    Dialysis Orders:[facility] T,Th,S 4h 34min 118kg 2/2 bath TDC/ LUA AVF Hep none -Mircera 225 mcg IV q 2 weeks( LAST ON 01/26/20) -Venofer 100 mg IVqtx through 2/11 -Calcitriol 1.12mcg PO TIW   Problem/Plan: 1. Left grt toe Diabetic Ulcer . [facility]VS following. LLE perc revasc L SFA/ popliteal arteries on 02/06/20. Now is s/p L great toe amp on 2/9 perDr Donzetta Matters. MRI neg for osteo. 2. ESRD - T,Th,S via TDC.  Next HD 2/13 at outpatient center  3. LUA AVF malfunction= using TDC , s/p Dr Donzetta Matters Revision of left arm cephalic vein AV [id number] transposition 4. Acute Hypoxic Respiratory failure/Volume Overload:resolved w/  HD 5. Hypertension/volume -bp stable/ hd UF as tolerated.  6. Anemia - Hgb 8.1. Received Aranesp 200 on 2/11. Continue Fe load.  7. Metabolic bone disease - Calcitriol 1.75 TIW.Phos6.1>8.5  Velphoro 1 incr 2 ac Ca8.0Ok  8. Nutrition -alb 2.7carb mod /Renal diet/ renal vit 9. Tobacco ABUSE - ongoing problem ,dw need to stop . Currently no plan 10. Debilitation = For SNF placement   Nyisha Clippard Larina Earthly PA-C [facility] Pager [phone] 02/10/2020,1:05 PM  LOS: 10 days

## 2020-02-10 NOTE — Progress Notes (Signed)
Discharge instructions and medication prescription placed on pt chart for SNF. Report called to Janett Billow, Therapist, sports at [facility]. Pt escorted to facility via [facility].  Arletta Bale, RN

## 2020-02-10 NOTE — Discharge Summary (Addendum)
Physician Discharge Summary  [name] [name] RXV:[id number] DOB: Feb 25, 1951 DOA: 01/31/2020  PCP: [place] date: 01/31/2020 Discharge date: 02/10/2020  Admitted From: Home Disposition: SNF  Recommendations for Outpatient Follow-up:  1. Follow up with SNF provider at earliest convenience 2. Outpatient follow-up with vascular surgery.  Wound care as per vascular surgery recommendations  3. Outpatient follow-up with dialysis as scheduled  4. follow up in ED if symptoms worsen or new appear   Home Health: No Equipment/Devices: None  Discharge Condition: Stable CODE STATUS: Full Diet recommendation: Heart healthy/carb modified/renal hemodialysis diet  Brief/Interim Summary: 69 year old female with history of CAD status post CABG, combined systolic and diastolic CHF, PVD, end-stage renal disease on hemodialysis, diabetes mellitus type 2, hyperparathyroidism, asthma, polysubstance abuse, tobacco abuse presented on 01/31/2020 with chest pain and dyspnea along with severe left great toe pain.  On presentation, chest x-ray showed cardiomegaly with vascular congestion.  X-ray of the left foot revealed ulceration of the left first digit without evidence of osteomyelitis.  She was admitted and started on broad-spectrum antibiotics.  Vascular surgery and nephrology were consulted.  She underwent aortogram with atherectomy of left SFA and popliteal arteries and stenting of left behind the knee popliteal artery on 02/06/2020.  She then underwent left great toe amputation and revision of left arm cephalic vein AV fistula on 02/07/2020 by vascular surgery.  Subsequently, antibiotics were discontinued.  PT recommended SNF placement.  She will be discharged to SNF once bed is available.   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  MRI showed focal area of ulceration in the first digit  distal tuft, no sinus tract or absces without definite evidence of osteomyelitis -Was empirically started on vancomycin, Rocephin and Flagyl and subsequently switched to Rocephin alone.  Cultures have been negative so far. -She underwent aortogram with atherectomy of left SFA and popliteal arteries and stenting of left behind the knee popliteal artery on 02/06/2020.   -She then underwent left great toe amputation and revision of left arm cephalic vein AV fistula on 02/07/2020 by vascular surgery.  Antibiotics discontinued on 02/08/2020 after clearance from vascular surgery. -Wound care as per vascular surgery.  Continue pain management as per vascular surgery recommendations -Currently afebrile and hemodynamically stable. -She will be discharged to SNF once bed is available.  Acute toxic encephalopathy -Now resolved.  Likely from gabapentin dose increase.  Currently on gabapentin 300 mg at night.  Chest pain, likely musculoskeletal in nature -No significant troponin elevation.  VQ scan unremarkable.  No acute chest x-ray findings. -Resolved.  No further work-up.  End-stage renal disease on hemodialysis -Nephrology following for dialysis.  Outpatient follow-up with dialysis as scheduled  Acute on chronic anemia of chronic disease -Hemoglobin 6.7 on admission.  Status post 2 units packed red cells transfusion on 02/02/2020.  Subsequently hemoglobin has remained stable.  8.1 this morning.  Outpatient follow-up  CAD status post CABG -Continue aspirin, Plavix, pravastatin  Hypertension -Continue Lopressor  Diabetes mellitus type 2 with hyperglycemia -Carb modified diet.  Continue home regimen..  A1c 5 in January 2021.  Depression, anxiety -Continue Wellbutrin  Hyperlipidemia -Continue pravastatin  GERD -Continue Protonix  Tobacco abuse -Cessation counseling was done by prior hospitalist  Morbid obesity -Outpatient follow-up  Generalized conditioning -PT is recommending  SNF placement.  Discharge Instructions  Discharge Instructions    Diet - low sodium heart healthy   Complete by:  As directed    Diet Carb Modified   Complete by: As directed    Increase activity slowly   Complete by: As directed      Allergies as of 02/10/2020      Reactions   Naproxen Rash      Medication List    STOP taking these medications   furosemide 40 MG tablet Commonly known as: LASIX   sodium bicarbonate 650 MG tablet   Wixela Inhub 250-50 MCG/DOSE Aepb Generic drug: Fluticasone-Salmeterol     TAKE these medications   allopurinol 100 MG tablet Commonly known as: ZYLOPRIM Take 100 mg by mouth 2 (two) times daily.   aspirin EC 325 MG tablet Take 325 mg by mouth daily.   brinzolamide 1 % ophthalmic suspension Commonly known as: AZOPT Place 1 drop into both eyes 2 (two) times a day.   buPROPion 150 MG 12 hr tablet Commonly known as: WELLBUTRIN SR Take 1 tablet (150 mg total) by mouth 2 (two) times daily.   cetirizine 10 MG tablet Commonly known as: ZYRTEC Take 10 mg by mouth daily as needed for allergies.   clopidogrel 75 MG tablet Commonly known as: PLAVIX Take 1 tablet (75 mg total) by mouth daily with breakfast. Start taking on: February 11, 2020   ferrous sulfate 325 (65 FE) MG tablet Take 325 mg by mouth 2 (two) times daily with a meal.   gabapentin 300 MG capsule Commonly known as: NEURONTIN Take 1 capsule (300 mg total) by mouth at bedtime. What changed: when to take this   insulin aspart 100 UNIT/ML injection Commonly known as: novoLOG Inject 2 Units into the skin 3 (three) times daily with meals.   ipratropium 17 MCG/ACT inhaler Commonly known as: ATROVENT HFA Inhale 2 puffs into the lungs every 6 (six) hours as needed for wheezing.   latanoprost 0.005 % ophthalmic solution Commonly known as: XALATAN Place 1 drop into both eyes at bedtime.   Linzess 72 MCG capsule Generic drug: linaclotide Take 72 mcg by mouth daily before  breakfast.   metoprolol tartrate 25 MG tablet Commonly known as: LOPRESSOR Take 1 tablet (25 mg total) by mouth 2 (two) times daily.   mometasone-formoterol 100-5 MCG/ACT Aero Commonly known as: DULERA Inhale 2 puffs into the lungs every 4 (four) hours as needed for wheezing.   multivitamin Tabs tablet Take 1 tablet by mouth at bedtime.   nitroGLYCERIN 0.4 MG SL tablet Commonly known as: NITROSTAT Place 1 tablet (0.4 mg total) under the tongue every 5 (five) minutes as needed for chest pain.   oxyCODONE 5 MG immediate release tablet Commonly known as: Oxy IR/ROXICODONE Take 1 tablet (5 mg total) by mouth every 6 (six) hours as needed for moderate pain.   pantoprazole 40 MG tablet Commonly known as: PROTONIX Take 1 tablet (40 mg total) by mouth daily.   polyethylene glycol 17 g packet Commonly known as: MiraLax Take 17 g by mouth daily as needed for moderate constipation.   pravastatin 40 MG tablet Commonly known as: PRAVACHOL Take 40 mg by mouth daily.   ProAir HFA 108 (90 Base) MCG/ACT inhaler Generic drug: albuterol Inhale 2 puffs into the lungs every 6 (six) hours as needed for wheezing.   Tums 500 MG chewable tablet Generic drug: calcium carbonate Chew 2 tablets by mouth at bedtime.   Velphoro 500 MG chewable tablet Generic drug: sucroferric oxyhydroxide Chew 2 tablets (1,000 mg total) by mouth 3 (three) times daily with meals. What changed: how much to  take       Follow-up Information    Waynetta Sandy, MD In 3 weeks.   Specialties: Vascular Surgery, Cardiology Why: Office will call you to arrange your appt (sent) Contact information: [name] [address] [place]. Schedule an appointment as soon as possible for a visit in 1 week(s).   Contact information: [place] [address] [phone]        [place], Peter M, MD .   Specialty: Cardiology Contact  information: [address] [place] [address] [phone]          Allergies  Allergen Reactions  . Naproxen Rash    Consultations: Vascular surgery  Procedures/Studies: MR FOOT LEFT WO CONTRAST  Result Date: 02/01/2020 CLINICAL DATA:  Non pressure chronic ulcer of the great toe, nail has fallen off EXAM: MRI OF THE LEFT FOOT WITHOUT CONTRAST TECHNIQUE: Multiplanar, multisequence MR imaging of the left was performed. No intravenous contrast was administered. COMPARISON:  None. FINDINGS: Bones/Joint/Cartilage There is a large area of ulceration seen overlying the dorsal surface of the distal first toe with no overlying nail. No soft tissue fluid collection is seen within the nail bed. There is diffuse overlying soft tissue swelling and skin thickening however. There is increased T2 hyperintense signal seen within the distal tuft of the first digit without T1 hypointensity. No acute fracture or areas of cortical destruction. No joint effusions are seen. Ligaments The Lisfranc ligaments are intact. Muscles and Tendons Diffusely increased signal seen throughout the muscles with diffuse fatty atrophy. The flexor extensor tendons appear to be intact. The plantar fascia is intact. Soft tissues Focal area of ulceration overlying the distal first digit as described above. There is dorsal subcutaneous edema. IMPRESSION: Focal area of ulceration at the first digit distal tuft with no nail. No sinus tract or soft tissue abscess. There is reactive marrow in the distal tuft of the first digit without definite evidence of osteomyelitis. 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 Dorsal subcutaneous edema. Electronically Signed   By: Prudencio Pair M.D.   On: 02/01/2020 23:54   PERIPHERAL VASCULAR CATHETERIZATION  Result Date: 02/06/2020 Patient name: [name] [name] MRN: [id number] DOB: May 14, 1951 Sex: female 02/06/2020 Pre-operative Diagnosis: Critical left lower extremity ischemia  with wound Post-operative diagnosis:  Same Surgeon:  Erlene Quan C. Donzetta Matters, MD Procedure Performed: 1.  Ultrasound-guided cannulation right common femoral artery 2.  Aortogram with bilateral lower extremity runoff 3.  Jetstream atherectomy of left SFA and popliteal arteries with distal filter protection 4.  Stent of left behind the knee popliteal artery with 5 x 60 mm Tigris 5.  Drug-coated balloon angioplasty left SFA with 5 x 250 mm in.pact 6.  Minx device closure right common femoral artery 7.  Moderate sedation with fentanyl and Versed for 55minutes Indications: 69 year old female has end-stage renal disease.  She now has great toe ischemia on the left will likely require amputation.  She is indicated for aortogram possible intervention on the left lower extremity. 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  Common femoral artery on the right by ultrasound was calcified however no flow-limiting stenosis.  Left lower extremity which is the area of interest, femoral arteries patent by angiography.  Left SFA has multiple areas of greater than 50% stenosis and is occluded at the adductor reconstitutes  below-knee popliteal artery with what appears to be three-vessel runoff dominant via the posterior tibial artery to the foot.  After intervention on the occluded popliteal artery was stented following jetstream atherectomy we now have 0% residual stenosis and the SFA is 0% residual stenosis where drug-coated balloon angioplasty was performed.  She still has three-vessel runoff to the foot with posterior tibial dominant.  The right lower extremity has a diminutive SFA with multiple greater than 50% stenosis in 1 area of likely frank occlusion.  Her popliteal artery above the knee reconstitutes this more robust than the left side has three-vessel runoff to the foot.  Procedure:  The patient was identified in the holding area and  taken to room 8.  The patient was then placed supine on the table and prepped and draped in the usual sterile fashion.  A time out was called.  Ultrasound was used to evaluate the right common femoral artery.  This was noted to be patent however was circumferentially calcified.  There is anesthetized 1% lidocaine cannulated with direct ultrasound visualization with micropuncture needle followed the wire sheath.  And images saved the permanent record.  A Bentson wire was placed followed by 5 [place] sheath.  Omni catheter was placed to the level of L1 and aortogram was performed followed bilateral lower extremity runoff with the above findings.  We elected to intervene.  We crossed the bifurcation with Omni catheter and Glidewire advantage.  A long 7 French sheath was placed patient was fully heparinized.  The cross catheter and Glidewire advantage were used to cross through the stenosed and occluded areas of the SFA and popliteal we gained intraluminal access below the knee and confirmed intraluminal access.  We then exchanged for an 014 bare wire and placed a Nav 6 embolic protection device.  Jetstream atherectomy was performed of the entire left SFA and popliteal arteries.  We then ballooned with 4 mm balloon of the entire segment.  There was obvious dissection of the distal popliteal this was stented primarily with Tigris 5 x 60 mm.  We then used a 5 x 250 mm in.pact drug-coated balloon angioplasty for the rest of the SFA.  We then postdilated our stent with 4 mm balloon.  Completion demonstrated no residual stenosis.  With three-vessel runoff to the foot.  The filter was removed was noted be intact.  She tolerated procedure without any complication. Contrast: 110cc Brandon C. Donzetta Matters, MD [facility] Office: [phone] Pager: [place] (V/Q Scan)  Result Date: 01/31/2020 CLINICAL DATA:  Nonradiating chest pain for 3 days. EXAM: NUCLEAR MEDICINE  VENTILATION - PERFUSION LUNG SCAN TECHNIQUE: Ventilation images were obtained in multiple projections using inhaled aerosol Tc-36m DTPA. Perfusion images were obtained in multiple projections after intravenous injection of Tc-57m MAA. RADIOPHARMACEUTICALS:  41.2 mCi of Tc-75m DTPA aerosol inhalation and 1.5 mCi Tc54m MAA IV COMPARISON:  April 28, 2019 FINDINGS: Ventilation: No focal ventilation defect. Perfusion: No wedge shaped peripheral perfusion defects to suggest acute pulmonary embolism. IMPRESSION: 1. Normal nuclear medicine ventilation/perfusion scan. Electronically Signed   By: Virgina Norfolk M.D.   On: 01/31/2020 20:34   DG Chest Portable 1 View  Result Date: 01/31/2020 CLINICAL DATA:  End-stage renal disease, chest pain and left lower extremity swelling EXAM: PORTABLE CHEST 1 VIEW COMPARISON:  01/01/2020 FINDINGS: Single frontal view of the chest demonstrates a right internal jugular dialysis catheter tip overlying superior vena cava. Postsurgical changes from median sternotomy. Cardiac silhouette is  enlarged but stable. Mild central vascular congestion is chronic, with no airspace disease, effusion, or pneumothorax. IMPRESSION: 1. Stable exam, no acute process. Electronically Signed   By: Randa Ngo M.D.   On: 01/31/2020 11:41   DG Foot Complete Left  Result Date: 01/31/2020 CLINICAL DATA:  Left foot swelling, left great toe pain and skin abnormality EXAM: LEFT FOOT - COMPLETE 3+ VIEW COMPARISON:  None. FINDINGS: Frontal, oblique, lateral views of the left foot are obtained. The bones are diffusely osteopenic. Prominent enthesopathic changes of the calcaneus. Joint spaces are relatively well preserved. There is diffuse soft tissue swelling of the forefoot. Evidence of soft tissue ulceration medial aspect first digit. I do not see any underlying bony destruction or periosteal reaction to suggest osteomyelitis. IMPRESSION: 1. Soft tissue swelling, with ulceration of the first digit. No  underlying radiographic evidence of osteomyelitis. If osteomyelitis remains a clinical concern, three-phase bone scan or MRI could be considered. Electronically Signed   By: Randa Ngo M.D.   On: 01/31/2020 11:41   VAS [place] ABI WITH/WO TBI  Result Date: 02/01/2020 LOWER EXTREMITY DOPPLER STUDY Indications: Gangrene.  Limitations: Today's exam was limited due to patient positioning, patient              intolerant to cuff pressure, an open wound and poor patient              cooperation, patient pain tolerance, patient constant movement. Comparison Study: No prior studies. Performing Technologist: Carlos Levering Rvt  Examination Guidelines: A complete evaluation includes at minimum, Doppler waveform signals and systolic blood pressure reading at the level of bilateral brachial, anterior tibial, and posterior tibial arteries, when vessel segments are accessible. Bilateral testing is considered an integral part of a complete examination. Photoelectric Plethysmograph (PPG) waveforms and toe systolic pressure readings are included as required and additional duplex testing as needed. Limited examinations for reoccurring indications may be performed as noted.  ABI Findings: +--------+------------------+-----+----------+--------+ Right   Rt Pressure (mmHg)IndexWaveform  Comment  +--------+------------------+-----+----------+--------+ [id number]                    triphasic          +--------+------------------+-----+----------+--------+ PTA     86                0.72 monophasic         +--------+------------------+-----+----------+--------+ DP      122               1.03 monophasic         +--------+------------------+-----+----------+--------+ +--------+------------------+-----+--------+--------------+ Left    Lt Pressure (mmHg)IndexWaveformComment        +--------+------------------+-----+--------+--------------+ Brachial                               Restricted arm  +--------+------------------+-----+--------+--------------+  Summary: Right: Values are likely unreliable due to constant patient movement and poor cooperation. Left: Patient refused further testing due to pain tolerance.  *See table(s) above for measurements and observations.  Electronically signed by Curt Jews MD on 02/01/2020 at 5:07:48 PM.   Final    VAS [place] (AVF,AVG)  Result Date: 01/20/2020 DIALYSIS ACCESS Reason for Exam: Bruising on upper arm. Access Site: Left Upper Extremity. Access Type: Basilic vein transposition. History: AVF placed 10/07/19. Used for last two dialysis sessions. Had not been          used previously. Performing Technologist: Ralene Cork RVT  Examination Guidelines: A complete evaluation includes B-mode imaging, spectral Doppler, color Doppler, and power Doppler as needed of all accessible portions of each vessel. Unilateral testing is considered an integral part of a complete examination. Limited examinations for reoccurring indications may be performed as noted.  Findings: +--------------------+----------+-----------------+--------+ AVF                 PSV (cm/s)Flow Vol (mL/min)Comments +--------------------+----------+-----------------+--------+ Native artery inflow   375           252                +--------------------+----------+-----------------+--------+ AVF Anastomosis        522                              +--------------------+----------+-----------------+--------+  +------------+----------+-------------+----------+-----------------------------+ OUTFLOW VEINPSV (cm/s)Diameter (cm)Depth (cm)          Describe            +------------+----------+-------------+----------+-----------------------------+ Prox UA        146        0.79        1.16    branch 0.365 cm velocity at                                                    confluence 250cm/s        +------------+----------+-------------+----------+-----------------------------+ Mid UA          77        0.96        0.93                                 +------------+----------+-------------+----------+-----------------------------+ Dist UA        226        1.05        0.25          branch 0.595 cm        +------------+----------+-------------+----------+-----------------------------+ AC Fossa        65        1.30        0.34          branch 0.621 cm        +------------+----------+-------------+----------+-----------------------------+  Summary: 1. Patent left brachio-baslic BVT. 2. Large branches at the [facility] and distal outflow vein.  *See table(s) above for measurements and observations.  Diagnosing physician: Servando Snare MD Electronically signed by Servando Snare MD on 01/20/2020 at 11:24:58 AM.   --------------------------------------------------------------------------------   Final        Subjective: Patient seen and examined at bedside.  Poor historian.  Denies worsening lower extremity pain.  No overnight fever or vomiting.  She is asking when she can go home.  Discharge Exam: Vitals:   02/10/20 0639 02/10/20 0835  BP: 109/81 (!) 110/49  Pulse:  (!) 57  Resp:  15  Temp:  98.3 F (36.8 C)  SpO2:  97%    General exam: No distress.  Looks chronically ill. Respiratory system: Bilateral decreased breath sounds at bases with some scattered crackles.  No wheezing  cardiovascular system: Rate controlled, S1-S2 heard Gastrointestinal system: Abdomen is morbidly obese, nondistended, soft and nontender.  Bowel sounds heard Extremities: No clubbing; bilateral trace lower extremity edema.  Left foot dressing  present.    The results of significant diagnostics from this hospitalization (including imaging, microbiology, ancillary and laboratory) are listed below for reference.     Microbiology: Recent Results (from the past 240 hour(s))  Blood culture (routine x 2)      Status: None   Collection Time: 01/31/20 11:16 AM   Specimen: BLOOD RIGHT HAND  Result Value Ref Range Status   Specimen Description BLOOD RIGHT HAND  Final   Special Requests   Final    BOTTLES DRAWN AEROBIC ONLY Blood Culture results may not be optimal due to an inadequate volume of blood received in culture bottles   Culture   Final    NO GROWTH 5 DAYS Performed at [facility], [place] [address], [place], [place] [address]    Report Status 02/05/2020 FINAL  Final  Blood culture (routine x 2)     Status: None   Collection Time: 01/31/20 11:58 AM   Specimen: BLOOD  Result Value Ref Range Status   Specimen Description BLOOD RIGHT ANTECUBITAL  Final   Special Requests   Final    BOTTLES DRAWN AEROBIC AND ANAEROBIC Blood Culture results may not be optimal due to an inadequate volume of blood received in culture bottles   Culture   Final    NO GROWTH 5 DAYS Performed at [facility], [place] [address], [place], [place] [address]    Report Status 02/05/2020 FINAL  Final  Respiratory Panel by RT PCR (Flu A&B, Covid) - Nasopharyngeal Swab     Status: None   Collection Time: 01/31/20 12:01 PM   Specimen: Nasopharyngeal Swab  Result Value Ref Range Status   SARS Coronavirus 2 by RT PCR NEGATIVE NEGATIVE Final    Comment: (NOTE) SARS-CoV-2 target nucleic acids are NOT DETECTED. The SARS-CoV-2 RNA is generally detectable in upper respiratoy specimens during the acute phase of infection. The lowest concentration of SARS-CoV-2 viral copies this assay can detect is 131 copies/mL. A negative result does not preclude SARS-Cov-2 infection and should not be used as the sole basis for treatment or other patient management decisions. A negative result may occur with  improper specimen collection/handling, submission of specimen other than nasopharyngeal swab, presence of viral mutation(s) within the areas targeted by this assay, and inadequate number of viral copies (<131 copies/mL). A  negative result must be combined with clinical observations, patient history, and epidemiological information. The expected result is Negative. Fact Sheet for Patients:  PinkCheek.be Fact Sheet for Healthcare Providers:  GravelBags.it This test is not yet ap proved or cleared by the [place] FDA and  has been authorized for detection and/or diagnosis of SARS-CoV-2 by FDA under an Emergency Use Authorization (EUA). This EUA will remain  in effect (meaning this test can be used) for the duration of the COVID-19 declaration under Section 564(b)(1) of the Act, 21 U.S.C. section 360bbb-3(b)(1), unless the authorization is terminated or revoked sooner.    Influenza A by PCR NEGATIVE NEGATIVE Final   Influenza B by PCR NEGATIVE NEGATIVE Final    Comment: (NOTE) The Xpert Xpress SARS-CoV-2/FLU/RSV assay is intended as an aid in  the diagnosis of influenza from Nasopharyngeal swab specimens and  should not be used as a sole basis for treatment. Nasal washings and  aspirates are unacceptable for Xpert Xpress SARS-CoV-2/FLU/RSV  testing. Fact Sheet for Patients: PinkCheek.be Fact Sheet for Healthcare Providers: GravelBags.it This test is not yet approved or cleared by the [place] and  has been authorized  for detection and/or diagnosis of SARS-CoV-2 by  FDA under an Emergency Use Authorization (EUA). This EUA will remain  in effect (meaning this test can be used) for the duration of the  Covid-19 declaration under Section 564(b)(1) of the Act, 21  U.S.C. section 360bbb-3(b)(1), unless the authorization is  terminated or revoked. Performed at [facility], [place] [address] [place], [place] [address]   MRSA PCR Screening     Status: None   Collection Time: 02/01/20 12:00 AM   Specimen: Nasopharyngeal  Result Value Ref Range Status   MRSA by PCR NEGATIVE  NEGATIVE Final    Comment:        The GeneXpert MRSA Assay (FDA approved for NASAL specimens only), is one component of a comprehensive MRSA colonization surveillance program. It is not intended to diagnose MRSA infection nor to guide or monitor treatment for MRSA infections. Performed at [facility], [place] [address], [place], [place] [address]   Surgical pcr screen     Status: None   Collection Time: 02/06/20 11:35 PM   Specimen: Nasal Mucosa; Nasal Swab  Result Value Ref Range Status   MRSA, PCR NEGATIVE NEGATIVE Final   Staphylococcus aureus NEGATIVE NEGATIVE Final    Comment: (NOTE) The Xpert SA Assay (FDA approved for NASAL specimens in patients 59 years of age and older), is one component of a comprehensive surveillance program. It is not intended to diagnose infection nor to guide or monitor treatment. Performed at [facility], [place] [address] [place], [place] [address]   SARS CORONAVIRUS 2 (TAT 6-24 HRS) Nasopharyngeal Nasopharyngeal Swab     Status: None   Collection Time: 02/09/20  5:13 PM   Specimen: Nasopharyngeal Swab  Result Value Ref Range Status   SARS Coronavirus 2 NEGATIVE NEGATIVE Final    Comment: (NOTE) SARS-CoV-2 target nucleic acids are NOT DETECTED. The SARS-CoV-2 RNA is generally detectable in upper and lower respiratory specimens during the acute phase of infection. Negative results do not preclude SARS-CoV-2 infection, do not rule out co-infections with other pathogens, and should not be used as the sole basis for treatment or other patient management decisions. Negative results must be combined with clinical observations, patient history, and epidemiological information. The expected result is Negative. Fact Sheet for Patients: SugarRoll.be Fact Sheet for Healthcare Providers: https://www.woods-mathews.com/ This test is not yet approved or cleared by the [place] FDA and  has been  authorized for detection and/or diagnosis of SARS-CoV-2 by FDA under an Emergency Use Authorization (EUA). This EUA will remain  in effect (meaning this test can be used) for the duration of the COVID-19 declaration under Section 56 4(b)(1) of the Act, 21 U.S.C. section 360bbb-3(b)(1), unless the authorization is terminated or revoked sooner. Performed at [facility], [place] [address], [place], [place] [address]      Labs: BNP (last 3 results) Recent Labs    06/01/19 2322 08/16/19 0934 01/01/20 0600  BNP 331.8* 250.5* 970.2*   Basic Metabolic Panel: Recent Labs  Lab 02/04/20 0814 02/06/20 0535 02/07/20 1122 02/09/20 1035 02/10/20 0233  NA 142 141 139 133* 137  K 3.8 3.9 4.3 4.9 4.6  CL 101 104 103 94* 98  CO2 25 22 22  21* 25  GLUCOSE 80 138* 177* 224* 164*  BUN 51* 47* 67* 67* 42*  CREATININE 6.57* 5.87* 7.21* 7.57* 5.89*  CALCIUM 8.2* 8.0* 7.6* 8.2* 8.0*  MG  --  1.6*  --   --   --  PHOS 6.1*  --  7.0* 8.5*  --    Liver Function Tests: Recent Labs  Lab 02/04/20 0814 02/06/20 0535 02/07/20 1122 02/09/20 1035  AST  --  20  --   --   ALT  --  12  --   --   ALKPHOS  --  98  --   --   BILITOT  --  0.3  --   --   PROT  --  6.1*  --   --   ALBUMIN 2.5* 2.7* 2.4* 2.3*   No results for input(s): LIPASE, AMYLASE in the last 168 hours. No results for input(s): AMMONIA in the last 168 hours. CBC: Recent Labs  Lab 02/04/20 0814 02/06/20 0535 02/07/20 1122 02/09/20 0518 02/10/20 0233  WBC 7.4 7.5 7.6 10.8* 8.2  NEUTROABS  --  4.9  --   --  5.5  HGB 8.4* 9.5* 8.3* 8.5* 8.1*  HCT 28.6* 32.8* 28.6* 28.3* 27.5*  MCV 88.5 90.4 90.8 87.1 88.1  PLT 324 297 256 283 261   Cardiac Enzymes: No results for input(s): CKTOTAL, CKMB, CKMBINDEX, TROPONINI in the last 168 hours. BNP: Invalid input(s): POCBNP CBG: Recent Labs  Lab 02/09/20 0700 02/09/20 1106 02/09/20 1640 02/09/20 2108 02/10/20 0608  GLUCAP 175* 186* 83 287* 173*   D-Dimer No results for  input(s): DDIMER in the last 72 hours. Hgb A1c No results for input(s): HGBA1C in the last 72 hours. Lipid Profile No results for input(s): CHOL, HDL, LDLCALC, TRIG, CHOLHDL, LDLDIRECT in the last 72 hours. Thyroid function studies No results for input(s): TSH, T4TOTAL, T3FREE, THYROIDAB in the last 72 hours.  Invalid input(s): FREET3 Anemia work up No results for input(s): VITAMINB12, FOLATE, FERRITIN, TIBC, IRON, RETICCTPCT in the last 72 hours. Urinalysis    Component Value Date/Time   COLORURINE YELLOW 08/19/2019 0712   APPEARANCEUR HAZY (A) 08/19/2019 0712   LABSPEC 1.012 08/19/2019 0712   PHURINE 5.0 08/19/2019 0712   GLUCOSEU NEGATIVE 08/19/2019 0712   HGBUR NEGATIVE 08/19/2019 0712   BILIRUBINUR NEGATIVE 08/19/2019 0712   KETONESUR NEGATIVE 08/19/2019 0712   PROTEINUR NEGATIVE 08/19/2019 0712   UROBILINOGEN 0.2 05/12/2015 0938   NITRITE NEGATIVE 08/19/2019 0712   LEUKOCYTESUR SMALL (A) 08/19/2019 0712   Sepsis Labs Invalid input(s): PROCALCITONIN,  WBC,  LACTICIDVEN Microbiology Recent Results (from the past 240 hour(s))  Blood culture (routine x 2)     Status: None   Collection Time: 01/31/20 11:16 AM   Specimen: BLOOD RIGHT HAND  Result Value Ref Range Status   Specimen Description BLOOD RIGHT HAND  Final   Special Requests   Final    BOTTLES DRAWN AEROBIC ONLY Blood Culture results may not be optimal due to an inadequate volume of blood received in culture bottles   Culture   Final    NO GROWTH 5 DAYS Performed at [facility], [place] [address], [place], [place] [address]    Report Status 02/05/2020 FINAL  Final  Blood culture (routine x 2)     Status: None   Collection Time: 01/31/20 11:58 AM   Specimen: BLOOD  Result Value Ref Range Status   Specimen Description BLOOD RIGHT ANTECUBITAL  Final   Special Requests   Final    BOTTLES DRAWN AEROBIC AND ANAEROBIC Blood Culture results may not be optimal due to an inadequate volume of blood received in  culture bottles   Culture   Final    NO GROWTH 5 DAYS Performed at [facility]  Lab, [address] [place], [place] [address]    Report Status 02/05/2020 FINAL  Final  Respiratory Panel by RT PCR (Flu A&B, Covid) - Nasopharyngeal Swab     Status: None   Collection Time: 01/31/20 12:01 PM   Specimen: Nasopharyngeal Swab  Result Value Ref Range Status   SARS Coronavirus 2 by RT PCR NEGATIVE NEGATIVE Final    Comment: (NOTE) SARS-CoV-2 target nucleic acids are NOT DETECTED. The SARS-CoV-2 RNA is generally detectable in upper respiratoy specimens during the acute phase of infection. The lowest concentration of SARS-CoV-2 viral copies this assay can detect is 131 copies/mL. A negative result does not preclude SARS-Cov-2 infection and should not be used as the sole basis for treatment or other patient management decisions. A negative result may occur with  improper specimen collection/handling, submission of specimen other than nasopharyngeal swab, presence of viral mutation(s) within the areas targeted by this assay, and inadequate number of viral copies (<131 copies/mL). A negative result must be combined with clinical observations, patient history, and epidemiological information. The expected result is Negative. Fact Sheet for Patients:  PinkCheek.be Fact Sheet for Healthcare Providers:  GravelBags.it This test is not yet ap proved or cleared by the [place] FDA and  has been authorized for detection and/or diagnosis of SARS-CoV-2 by FDA under an Emergency Use Authorization (EUA). This EUA will remain  in effect (meaning this test can be used) for the duration of the COVID-19 declaration under Section 564(b)(1) of the Act, 21 U.S.C. section 360bbb-3(b)(1), unless the authorization is terminated or revoked sooner.    Influenza A by PCR NEGATIVE NEGATIVE Final   Influenza B by PCR NEGATIVE NEGATIVE Final    Comment:  (NOTE) The Xpert Xpress SARS-CoV-2/FLU/RSV assay is intended as an aid in  the diagnosis of influenza from Nasopharyngeal swab specimens and  should not be used as a sole basis for treatment. Nasal washings and  aspirates are unacceptable for Xpert Xpress SARS-CoV-2/FLU/RSV  testing. Fact Sheet for Patients: PinkCheek.be Fact Sheet for Healthcare Providers: GravelBags.it This test is not yet approved or cleared by the [place] FDA and  has been authorized for detection and/or diagnosis of SARS-CoV-2 by  FDA under an Emergency Use Authorization (EUA). This EUA will remain  in effect (meaning this test can be used) for the duration of the  Covid-19 declaration under Section 564(b)(1) of the Act, 21  U.S.C. section 360bbb-3(b)(1), unless the authorization is  terminated or revoked. Performed at [facility], [place] [address], [place], [place] [address]   MRSA PCR Screening     Status: None   Collection Time: 02/01/20 12:00 AM   Specimen: Nasopharyngeal  Result Value Ref Range Status   MRSA by PCR NEGATIVE NEGATIVE Final    Comment:        The GeneXpert MRSA Assay (FDA approved for NASAL specimens only), is one component of a comprehensive MRSA colonization surveillance program. It is not intended to diagnose MRSA infection nor to guide or monitor treatment for MRSA infections. Performed at [facility], [place] [address] [place], [place] [address]   Surgical pcr screen     Status: None   Collection Time: 02/06/20 11:35 PM   Specimen: Nasal Mucosa; Nasal Swab  Result Value Ref Range Status   MRSA, PCR NEGATIVE NEGATIVE Final   Staphylococcus aureus NEGATIVE NEGATIVE Final    Comment: (NOTE) The Xpert SA Assay (FDA approved for NASAL specimens in patients 72 years of age and older),  is one component of a comprehensive surveillance program. It is not intended to diagnose infection nor to guide or monitor  treatment. Performed at [facility], [place] [address] [place], [place] [address]   SARS CORONAVIRUS 2 (TAT 6-24 HRS) Nasopharyngeal Nasopharyngeal Swab     Status: None   Collection Time: 02/09/20  5:13 PM   Specimen: Nasopharyngeal Swab  Result Value Ref Range Status   SARS Coronavirus 2 NEGATIVE NEGATIVE Final    Comment: (NOTE) SARS-CoV-2 target nucleic acids are NOT DETECTED. The SARS-CoV-2 RNA is generally detectable in upper and lower respiratory specimens during the acute phase of infection. Negative results do not preclude SARS-CoV-2 infection, do not rule out co-infections with other pathogens, and should not be used as the sole basis for treatment or other patient management decisions. Negative results must be combined with clinical observations, patient history, and epidemiological information. The expected result is Negative. Fact Sheet for Patients: SugarRoll.be Fact Sheet for Healthcare Providers: https://www.woods-mathews.com/ This test is not yet approved or cleared by the [place] FDA and  has been authorized for detection and/or diagnosis of SARS-CoV-2 by FDA under an Emergency Use Authorization (EUA). This EUA will remain  in effect (meaning this test can be used) for the duration of the COVID-19 declaration under Section 56 4(b)(1) of the Act, 21 U.S.C. section 360bbb-3(b)(1), unless the authorization is terminated or revoked sooner. Performed at [facility], [place] [address], [place], [place] [address]      Time coordinating discharge: 35 minutes  SIGNED:   Aline August, MD  [facility] 02/10/2020, 10:22 AM

## 2020-02-10 NOTE — Discharge Instructions (Signed)
Wash wounds daily and place dry dressing over right toe amputation site.    Heel weight bearing only on right foot with Darco shoe.

## 2020-02-10 NOTE — TOC Transition Note (Signed)
Transition of Care [facility]) - CM/SW Discharge Note   Patient Details  Name: [name] [name] MRN: [id number] Date of Birth: Jan 29, 1951  [facility] CM/SW Contact:  Vinie Sill, [place] Phone Number: 02/10/2020, 12:34 PM   Clinical Narrative:     Patient will DC to: [place] Date: 02/10/20 Family Notified: patient states she will call her significant other Transport By: Corey Harold  RN, patient, and facility notified of DC. Discharge Summary sent to facility. RN given number for [phone]. Ambulance transport requested for patient.   Clinical Social Worker signing off. Thurmond Butts, MSW, [place] Clinical Social Worker     Final next level of care: Skilled Nursing Facility Barriers to Discharge: Barriers Resolved   Patient Goals and CMS Choice Patient states their goals for this hospitalization and ongoing recovery are:: Pt is agreeable to SNF CMS Medicare.gov Compare Post Acute Care list provided to:: Patient Choice offered to / list presented to : Patient  Discharge Placement PASRR number recieved: 02/24/20            Patient chooses bed at: [facility] Patient to be transferred to facility by: [facility] Name of family member notified: patient declined- patient states she will call her significant other herself Patient and family notified of of transfer: 02/10/20  Discharge Plan and Services In-house Referral: Clinical Social Work Discharge Planning Services: NA Post Acute Care Choice: [place]          DME Arranged: N/A DME Agency: NA       HH Arranged: NA [place] Agency: NA        Social Determinants of Health (SDOH) Interventions     Readmission Risk Interventions Readmission Risk Prevention Plan 12/12/2019 10/08/2019 08/26/2019  Transportation Screening Complete - Complete  Medication Review Press photographer) Complete - Complete  PCP or Specialist appointment within 3-5 days of discharge Complete - Complete  HRI or  [place] - - Complete  SW Recovery Care/Counseling Consult - - Complete  [place] - - Not Applicable  Some recent data might be hidden

## 2020-02-10 NOTE — Progress Notes (Signed)
Occupational Therapy Treatment Patient Details Name: [name] [name] MRN: [id number] DOB: 07/13/1951 Today's Date: 02/10/2020    History of present illness [name] [name] is a 69 y.o. female with medical history significant of CAD s/p CABG, combined systolic and diastolic CHF, PVD, ESRD on HD (TTS), DM type II, hyperparathyroidism, asthma, polysubstance abuse, tobacco abuse. She presented with complaints of chest pain over the last 3 days, likely musculoskeletal in nature. Also with left great toe diabetic foot ulcer. 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    OT comments  Pt making steady progress towards OT goals this session. Session focus on functional mobility as precursor to higher level BADLs and seated grooming tasks. Overall, pt requires MIN A +2 for functional mobility with RW. Pt able to progress functional mobility distance with close chair follow as pt noted to impulsively sit during functional mobility d/t pain in L foot. Pt completed x4 sit<>stands this session progressing to min guard assist for sit<>stand with RW. Pt completed seated UB ADLs with set-up - MIN A and LB ADLs with MAX A. DC plan currently remains appropriate, will continue to follow acutely per POC.    Follow Up Recommendations  SNF    Equipment Recommendations  3 in 1 bedside commode    Recommendations for Other Services      Precautions / Restrictions Precautions Precautions: Fall Required Braces or Orthoses: Other Brace Other Brace: darco shoe Restrictions Weight Bearing Restrictions: No Other Position/Activity Restrictions: no active WB orders per chart review; use of darco shoe and educated on weightbearing on heel       Mobility Bed Mobility Overal bed mobility: Modified Independent             General bed mobility comments: sitting EOB upon arrival  Transfers Overall transfer level: Needs assistance Equipment used: Rolling walker (2 wheeled) Transfers: Sit to/from Stand Sit to Stand: Min  assist;Min guard;+2 physical assistance;+2 safety/equipment         General transfer comment: pt required cues for hand placement during all sit>stand trials. Pt initially requiring MIN A +2 to power into standing but progressed to min guard +2 for safety. Pt impulsive during session noted to descend prematurely onto sitting surface needing MIN A to guide hips    Balance Overall balance assessment: Needs assistance Sitting-balance support: Feet supported Sitting balance-Leahy Scale: Good     Standing balance support: Bilateral upper extremity supported;During functional activity                               ADL either performed or assessed with clinical judgement   ADL Overall ADL's : Needs assistance/impaired     Grooming: Brushing hair;Sitting;Set up           Upper Body Dressing : Minimal assistance;Sitting Upper Body Dressing Details (indicate cue type and reason): hospital gown as back cover Lower Body Dressing: Maximal assistance;Sitting/lateral leans;Sit to/from stand Lower Body Dressing Details (indicate cue type and reason): pt able to reach to feet to attempt to fasten straps of darco shoe but ultimatley required MAX A to don socks and shoe Toilet Transfer: Min guard;Minimal assistance;+2 for physical assistance;+2 for safety/equipment;RW;Ambulation Toilet Transfer Details (indicate cue type and reason): MINA - min guard+2 as pt noted to impulsively sit down during functionalmobility         Functional mobility during ADLs: Min guard;Rolling walker;Minimal assistance;+2 for safety/equipment;+2 for physical assistance General ADL Comments: pt limited by L  great toe pain and overall deconditioning for BADL routines     Vision       Perception     Praxis      Cognition Arousal/Alertness: Awake/alert Behavior During Therapy: WFL for tasks assessed/performed Overall Cognitive Status: Within Functional Limits for tasks assessed                                           Exercises     Shoulder Instructions       General Comments pt reprots dizziness upon initial sit<>stand with BP WNL; 140/87 (104)    Pertinent Vitals/ Pain       Pain Assessment: 0-10 Pain Score: 8  Pain Location: L foot Pain Descriptors / Indicators: Discomfort;Grimacing Pain Intervention(s): Monitored during session;Repositioned  Home Living                                          Prior Functioning/Environment              Frequency  Min 2X/week        Progress Toward Goals  OT Goals(current goals can now be found in the care plan section)  Progress towards OT goals: Progressing toward goals  Acute Rehab OT Goals Patient Stated Goal: "go to rehab." OT Goal Formulation: With patient Potential to Achieve Goals: Good  Plan Discharge plan remains appropriate    Co-evaluation                 AM-PAC OT "6 Clicks" Daily Activity     Outcome Measure   Help from another person eating meals?: None Help from another person taking care of personal grooming?: None Help from another person toileting, which includes using toliet, bedpan, or urinal?: A Little Help from another person bathing (including washing, rinsing, drying)?: A Lot Help from another person to put on and taking off regular upper body clothing?: None Help from another person to put on and taking off regular lower body clothing?: A Lot 6 Click Score: 19    End of Session Equipment Utilized During Treatment: Gait belt;Rolling walker;Other (comment)(darco shoe)  OT Visit Diagnosis: Unsteadiness on feet (R26.81);Other abnormalities of gait and mobility (R26.89);Muscle weakness (generalized) (M62.81);Pain Pain - Right/Left: Left Pain - part of body: Ankle and joints of foot   Activity Tolerance Patient tolerated treatment well   Patient Left in chair;with call bell/phone within reach;with chair alarm set   Nurse Communication           Time: [phone] OT Time Calculation (min): 32 min  Charges: OT General Charges $OT Visit: 1 Visit OT Treatments $Self Care/Home Management : 8-22 mins  Lanier Clam., COTA/L Acute Rehabilitation Services [phone]    Ihor Gully 02/10/2020, 1:30 PM

## 2020-02-10 NOTE — Progress Notes (Signed)
Patient ID: [name] [name], female   DOB: 07-09-1951, 69 y.o.   MRN: [id number]  PROGRESS NOTE    JADA FASS  FIE:[id number] DOB: 04/28/1951 DOA: 01/31/2020 PCP: [facility]   Brief Narrative:  69 year old female with history of CAD status post CABG, combined systolic and diastolic CHF, PVD, end-stage renal disease on hemodialysis, diabetes mellitus type 2, hyperparathyroidism, asthma, polysubstance abuse, tobacco abuse presented on 01/31/2020 with chest pain and dyspnea along with severe left great toe pain.  On presentation, chest x-ray showed cardiomegaly with vascular congestion.  X-ray of the left foot revealed ulceration of the left first digit without evidence of osteomyelitis.  She was admitted and started on broad-spectrum antibiotics.  Vascular surgery and nephrology were consulted.  She underwent aortogram with atherectomy of left SFA and popliteal arteries and stenting of left behind the knee popliteal artery on 02/06/2020.  She then underwent left great toe amputation and revision of left arm cephalic vein AV fistula on 02/07/2020 by vascular surgery.  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  MRI showed focal area of ulceration in the first digit distal tuft, no sinus tract or absces without definite evidence of osteomyelitis -Was empirically started on vancomycin, Rocephin and Flagyl and subsequently switched to Rocephin alone.  Cultures have been negative so far. -She underwent aortogram with atherectomy of left SFA and popliteal arteries and stenting of left behind the knee popliteal artery on 02/06/2020.   -She then underwent left great toe amputation and revision of left arm cephalic vein AV fistula on 02/07/2020 by vascular surgery.  Antibiotics discontinued on 02/08/2020 after clearance from vascular surgery. -Wound care as per vascular surgery.  Continue pain  management -Currently afebrile and hemodynamically stable.  Acute toxic encephalopathy -Now resolved.  Likely from gabapentin dose increase.  Currently on gabapentin 300 mg at night.  Chest pain, likely musculoskeletal in nature -No significant troponin elevation.  VQ scan unremarkable.  No acute chest x-ray findings. -Resolved.  No further work-up.  End-stage renal disease on hemodialysis -Nephrology following for dialysis.  Acute on chronic anemia of chronic disease -Hemoglobin 6.7 on admission.  Status post 2 units packed red cells transfusion on 02/02/2020.  Subsequently hemoglobin has remained stable.  8.1 this morning.  CAD status post CABG -Continue aspirin, Plavix, pravastatin  Hypertension -Continue Lopressor  Diabetes mellitus type 2 with hyperglycemia -Continue CBGs with coverage.  A1c 5 in January 2021  Depression, anxiety -Continue Wellbutrin  Hyperlipidemia -Continue pravastatin  GERD -Continue Protonix  Tobacco abuse -Cessation counseling was done by prior hospitalist  Morbid obesity -Outpatient follow-up  Generalized conditioning -PT is recommending SNF placement.  DVT prophylaxis: Subcutaneous heparin discontinued on the morning of 02/09/2020 because of slight blood streaking in the stool and occult blood positive  stool.  Will resume heparin on 02/11/2020 if no further similar episodes. Code Status: Full Family Communication: Spoke to patient at bedside Disposition Plan: SNF once bed is available.  Currently medically stable for discharge. Consultants: Vascular surgery  Procedures:  -aortogram with atherectomy of left SFA and popliteal arteries and stenting of left behind the knee popliteal artery on 02/06/2020.   - left great toe amputation and revision of left arm cephalic vein AV fistula on 02/07/2020 by vascular surgery  Antimicrobials:  Anti-infectives (From admission, onward)   Start     Dose/Rate Route Frequency Ordered Stop   02/02/20 1200   vancomycin (  VANCOCIN) IVPB 1000 mg/200 mL premix  Status:  Discontinued     1,000 mg 200 mL/hr over 60 Minutes Intravenous Every T-Th-Sa (Hemodialysis) 01/31/20 1822 02/03/20 1052   02/02/20 0730  ceFAZolin (ANCEF) IVPB 2g/100 mL premix  Status:  Discontinued     2 g 200 mL/hr over 30 Minutes Intravenous To Radiology 02/02/20 0728 02/02/20 0806   01/31/20 2030  cefTRIAXone (ROCEPHIN) 2 g in sodium chloride 0.9 % 100 mL IVPB  Status:  Discontinued     2 g 200 mL/hr over 30 Minutes Intravenous Every 24 hours 01/31/20 1749 02/08/20 0931   01/31/20 2000  metroNIDAZOLE (FLAGYL) IVPB 500 mg  Status:  Discontinued     500 mg 100 mL/hr over 60 Minutes Intravenous Every 8 hours 01/31/20 1749 02/03/20 1052   01/31/20 1930  vancomycin (VANCOREADY) IVPB 2000 mg/400 mL     2,000 mg 200 mL/hr over 120 Minutes Intravenous  Once 01/31/20 1822 02/01/20 0200       Subjective: Patient seen and examined at bedside.  Poor historian.  Denies worsening lower extremity pain.  No overnight fever or vomiting.  She is asking when she can go home. Objective: Vitals:   02/09/20 2019 02/10/20 0602 02/10/20 0615 02/10/20 0639  BP: (!) 117/52 (!) 90/55 (!) 95/52 109/81  Pulse: 72 62 62   Resp:  16    Temp: 98.8 F (37.1 C) 99.6 F (37.6 C)    TempSrc: Oral Oral    SpO2: 100% 98% 97%   Weight:  120 kg    Height:        Intake/Output Summary (Last 24 hours) at 02/10/2020 0754 Last data filed at 02/09/2020 2100 Gross per 24 hour  Intake 600 ml  Output 1000 ml  Net -400 ml   Filed Weights   02/09/20 1200 02/09/20 1604 02/10/20 0602  Weight: 121.5 kg 119.4 kg 120 kg    Examination:  General exam: No distress.  Looks chronically ill. Respiratory system: Bilateral decreased breath sounds at bases with some scattered crackles.  No wheezing  cardiovascular system: Rate controlled, S1-S2 heard Gastrointestinal system: Abdomen is morbidly obese, nondistended, soft and nontender.  Bowel sounds  heard Extremities: No clubbing; bilateral trace lower extremity edema.  Left foot dressing present.   Data Reviewed: I have personally reviewed following labs and imaging studies  CBC: Recent Labs  Lab 02/04/20 0814 02/06/20 0535 02/07/20 1122 02/09/20 0518 02/10/20 0233  WBC 7.4 7.5 7.6 10.8* 8.2  NEUTROABS  --  4.9  --   --  5.5  HGB 8.4* 9.5* 8.3* 8.5* 8.1*  HCT 28.6* 32.8* 28.6* 28.3* 27.5*  MCV 88.5 90.4 90.8 87.1 88.1  PLT 324 297 256 283 481   Basic Metabolic Panel: Recent Labs  Lab 02/04/20 0814 02/06/20 0535 02/07/20 1122 02/09/20 1035 02/10/20 0233  NA 142 141 139 133* 137  K 3.8 3.9 4.3 4.9 4.6  CL 101 104 103 94* 98  CO2 25 22 22  21* 25  GLUCOSE 80 138* 177* 224* 164*  BUN 51* 47* 67* 67* 42*  CREATININE 6.57* 5.87* 7.21* 7.57* 5.89*  CALCIUM 8.2* 8.0* 7.6* 8.2* 8.0*  MG  --  1.6*  --   --   --   PHOS 6.1*  --  7.0* 8.5*  --    GFR: Estimated Creatinine Clearance: 12.3 mL/min (A) (by C-G formula based on SCr of 5.89 mg/dL (H)). Liver Function Tests: Recent Labs  Lab 02/04/20 [id number] 02/06/20 0535 02/07/20 1122 02/09/20 1035  AST  --  20  --   --   ALT  --  12  --   --   ALKPHOS  --  98  --   --   BILITOT  --  0.3  --   --   PROT  --  6.1*  --   --   ALBUMIN 2.5* 2.7* 2.4* 2.3*   No results for input(s): LIPASE, AMYLASE in the last 168 hours. No results for input(s): AMMONIA in the last 168 hours. Coagulation Profile: No results for input(s): INR, PROTIME in the last 168 hours. Cardiac Enzymes: No results for input(s): CKTOTAL, CKMB, CKMBINDEX, TROPONINI in the last 168 hours. BNP (last 3 results) No results for input(s): PROBNP in the last 8760 hours. HbA1C: No results for input(s): HGBA1C in the last 72 hours. CBG: Recent Labs  Lab 02/09/20 0700 02/09/20 1106 02/09/20 1640 02/09/20 2108 02/10/20 0608  GLUCAP 175* 186* 83 287* 173*   Lipid Profile: No results for input(s): CHOL, HDL, LDLCALC, TRIG, CHOLHDL, LDLDIRECT in the last 72  hours. Thyroid Function Tests: No results for input(s): TSH, T4TOTAL, FREET4, T3FREE, THYROIDAB in the last 72 hours. Anemia Panel: No results for input(s): VITAMINB12, FOLATE, FERRITIN, TIBC, IRON, RETICCTPCT in the last 72 hours. Sepsis Labs: No results for input(s): PROCALCITON, LATICACIDVEN in the last 168 hours.  Recent Results (from the past 240 hour(s))  Blood culture (routine x 2)     Status: None   Collection Time: 01/31/20 11:16 AM   Specimen: BLOOD RIGHT HAND  Result Value Ref Range Status   Specimen Description BLOOD RIGHT HAND  Final   Special Requests   Final    BOTTLES DRAWN AEROBIC ONLY Blood Culture results may not be optimal due to an inadequate volume of blood received in culture bottles   Culture   Final    NO GROWTH 5 DAYS Performed at [facility], [place] [address], [place], [place] [address]    Report Status 02/05/2020 FINAL  Final  Blood culture (routine x 2)     Status: None   Collection Time: 01/31/20 11:58 AM   Specimen: BLOOD  Result Value Ref Range Status   Specimen Description BLOOD RIGHT ANTECUBITAL  Final   Special Requests   Final    BOTTLES DRAWN AEROBIC AND ANAEROBIC Blood Culture results may not be optimal due to an inadequate volume of blood received in culture bottles   Culture   Final    NO GROWTH 5 DAYS Performed at [facility], [place] [address] [place], [place] [address]    Report Status 02/05/2020 FINAL  Final  Respiratory Panel by RT PCR (Flu A&B, Covid) - Nasopharyngeal Swab     Status: None   Collection Time: 01/31/20 12:01 PM   Specimen: Nasopharyngeal Swab  Result Value Ref Range Status   SARS Coronavirus 2 by RT PCR NEGATIVE NEGATIVE Final    Comment: (NOTE) SARS-CoV-2 target nucleic acids are NOT DETECTED. The SARS-CoV-2 RNA is generally detectable in upper respiratoy specimens during the acute phase of infection. The lowest concentration of SARS-CoV-2 viral copies this assay can detect is 131 copies/mL. A negative  result does not preclude SARS-Cov-2 infection and should not be used as the sole basis for treatment or other patient management decisions. A negative result may occur with  improper specimen collection/handling, submission of specimen other than nasopharyngeal swab, presence of viral mutation(s) within the areas targeted by this assay, and inadequate number of viral copies (<131  copies/mL). A negative result must be combined with clinical observations, patient history, and epidemiological information. The expected result is Negative. Fact Sheet for Patients:  PinkCheek.be Fact Sheet for Healthcare Providers:  GravelBags.it This test is not yet ap proved or cleared by the [place] FDA and  has been authorized for detection and/or diagnosis of SARS-CoV-2 by FDA under an Emergency Use Authorization (EUA). This EUA will remain  in effect (meaning this test can be used) for the duration of the COVID-19 declaration under Section 564(b)(1) of the Act, 21 U.S.C. section 360bbb-3(b)(1), unless the authorization is terminated or revoked sooner.    Influenza A by PCR NEGATIVE NEGATIVE Final   Influenza B by PCR NEGATIVE NEGATIVE Final    Comment: (NOTE) The Xpert Xpress SARS-CoV-2/FLU/RSV assay is intended as an aid in  the diagnosis of influenza from Nasopharyngeal swab specimens and  should not be used as a sole basis for treatment. Nasal washings and  aspirates are unacceptable for Xpert Xpress SARS-CoV-2/FLU/RSV  testing. Fact Sheet for Patients: PinkCheek.be Fact Sheet for Healthcare Providers: GravelBags.it This test is not yet approved or cleared by the [place] FDA and  has been authorized for detection and/or diagnosis of SARS-CoV-2 by  FDA under an Emergency Use Authorization (EUA). This EUA will remain  in effect (meaning this test can be used) for the  duration of the  Covid-19 declaration under Section 564(b)(1) of the Act, 21  U.S.C. section 360bbb-3(b)(1), unless the authorization is  terminated or revoked. Performed at [facility], [place] [address], [place], [place] [address]   MRSA PCR Screening     Status: None   Collection Time: 02/01/20 12:00 AM   Specimen: Nasopharyngeal  Result Value Ref Range Status   MRSA by PCR NEGATIVE NEGATIVE Final    Comment:        The GeneXpert MRSA Assay (FDA approved for NASAL specimens only), is one component of a comprehensive MRSA colonization surveillance program. It is not intended to diagnose MRSA infection nor to guide or monitor treatment for MRSA infections. Performed at [facility], [place] [address] [place],  [address]   Surgical pcr screen     Status: None   Collection Time: 02/06/20 11:35 PM   Specimen: Nasal Mucosa; Nasal Swab  Result Value Ref Range Status   MRSA, PCR NEGATIVE NEGATIVE Final   Staphylococcus aureus NEGATIVE NEGATIVE Final    Comment: (NOTE) The Xpert SA Assay (FDA approved for NASAL specimens in patients 77 years of age and older), is one component of a comprehensive surveillance program. It is not intended to diagnose infection nor to guide or monitor treatment. Performed at [facility], [place] [address], [place], [place] [address]   SARS CORONAVIRUS 2 (TAT 6-24 HRS) Nasopharyngeal Nasopharyngeal Swab     Status: None   Collection Time: 02/09/20  5:13 PM   Specimen: Nasopharyngeal Swab  Result Value Ref Range Status   SARS Coronavirus 2 NEGATIVE NEGATIVE Final    Comment: (NOTE) SARS-CoV-2 target nucleic acids are NOT DETECTED. The SARS-CoV-2 RNA is generally detectable in upper and lower respiratory specimens during the acute phase of infection. Negative results do not preclude SARS-CoV-2 infection, do not rule out co-infections with other pathogens, and should not be used as the sole basis for treatment or other patient  management decisions. Negative results must be combined with clinical observations, patient history, and epidemiological information. The expected result is Negative. Fact Sheet for Patients: SugarRoll.be Fact Sheet  for Healthcare Providers: https://www.woods-mathews.com/ This test is not yet approved or cleared by the [place] and  has been authorized for detection and/or diagnosis of SARS-CoV-2 by FDA under an Emergency Use Authorization (EUA). This EUA will remain  in effect (meaning this test can be used) for the duration of the COVID-19 declaration under Section 56 4(b)(1) of the Act, 21 U.S.C. section 360bbb-3(b)(1), unless the authorization is terminated or revoked sooner. Performed at [facility], [place] [address], [place], [place] [address]          Radiology Studies: No results found.      Scheduled Meds: . sodium chloride   Intravenous Once  . allopurinol  100 mg Oral BID  . aspirin EC  325 mg Oral Daily  . brinzolamide  1 drop Both Eyes BID  . calcitRIOL  1.75 mcg Oral Q T,Th,Sa-HD  . Chlorhexidine Gluconate Cloth  6 each Topical Q0600  . Chlorhexidine Gluconate Cloth  6 each Topical Q0600  . Chlorhexidine Gluconate Cloth  6 each Topical Q0600  . clopidogrel  300 mg Oral Once   Followed by  . clopidogrel  75 mg Oral Q breakfast  . darbepoetin (ARANESP) injection - DIALYSIS  200 mcg Intravenous Q Thu-HD  . feeding supplement (NEPRO CARB STEADY)  237 mL Oral BID BM  . gabapentin  300 mg Oral QHS  . insulin aspart  0-6 Units Subcutaneous TID WC  . latanoprost  1 drop Both Eyes QHS  . linaclotide  72 mcg Oral QAC breakfast  . metoprolol tartrate  25 mg Oral BID  . mometasone-formoterol  2 puff Inhalation BID  . multivitamin  1 tablet Oral QHS  . pantoprazole  40 mg Oral Daily  . pravastatin  40 mg Oral Daily  . sodium chloride flush  3 mL Intravenous Q12H  . sodium chloride flush  3 mL Intravenous  Q12H  . sucroferric oxyhydroxide  1,000 mg Oral TID WC   Continuous Infusions: . sodium chloride 10 mL/hr at 02/07/20 0727  . sodium chloride    . ferric gluconate (FERRLECIT/NULECIT) IV            Aline August, MD [facility] 02/10/2020, 7:54 AM

## 2020-02-10 NOTE — Progress Notes (Signed)
Physical Therapy Treatment Patient Details Name: [name] [name] MRN: [id number] DOB: [birth year] Today's Date: 02/10/2020    History of Present Illness Kateri TAKINA BUSSER is a 69 y.o. female with medical history significant of CAD s/p CABG, combined systolic and diastolic CHF, PVD, ESRD on HD (TTS), DM type II, hyperparathyroidism, asthma, polysubstance abuse, tobacco abuse. She presented with complaints of chest pain over the last 3 days, likely musculoskeletal in nature. Also with left great toe diabetic foot ulcer. 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     PT Comments    Patient seen for mobility progression. Pt tolerated short distance gait in room with session. Continue to progress as tolerated with anticipated d/c to SNF for further skilled PT services.     Follow Up Recommendations  SNF     Equipment Recommendations  Rolling walker with 5" wheels;3in1 (PT)    Recommendations for Other Services       Precautions / Restrictions Precautions Precautions: Fall Required Braces or Orthoses: Other Brace Other Brace: darco shoe Restrictions Weight Bearing Restrictions: Yes Other Position/Activity Restrictions: WB through heel with darco shoe    Mobility  Bed Mobility Overal bed mobility: Modified Independent             General bed mobility comments: sitting EOB upon arrival  Transfers Overall transfer level: Needs assistance Equipment used: Rolling walker (2 wheeled) Transfers: Sit to/from Stand Sit to Stand: Min assist;Min guard;+2 physical assistance;+2 safety/equipment         General transfer comment: pt required cues for hand placement during all sit>stand trials. Pt initially requiring MIN A +2 to power into standing but progressed to min guard +2 for safety. Pt impulsive during session noted to descend prematurely onto sitting surface needing MIN A to guide hips  Ambulation/Gait Ambulation/Gait assistance: Min guard Gait Distance (Feet): (~30 ft total with 2 seated  breaks) Assistive device: Rolling walker (2 wheeled) Gait Pattern/deviations: Step-through pattern;Decreased stride length;Wide base of support;Trunk flexed;Antalgic;Decreased weight shift to left;Decreased stance time - left;Decreased step length - right Gait velocity: decreased   General Gait Details: cues for safe use of AD (pt leans on forearms when fatigued) and upright posture; pt limited by fatigue   Stairs             Wheelchair Mobility    Modified Rankin (Stroke Patients Only)       Balance Overall balance assessment: Needs assistance Sitting-balance support: Feet supported Sitting balance-Leahy Scale: Good     Standing balance support: Bilateral upper extremity supported;During functional activity Standing balance-Leahy Scale: Poor Standing balance comment: reliant on external support                            Cognition Arousal/Alertness: Awake/alert Behavior During Therapy: WFL for tasks assessed/performed Overall Cognitive Status: Within Functional Limits for tasks assessed                                 General Comments: pt unsure of which foot darco shoe went on      Exercises      General Comments General comments (skin integrity, edema, etc.): pt reprots dizziness upon initial sit<>stand with BP WNL; 140/87 (104)      Pertinent Vitals/Pain Pain Assessment: 0-10 Pain Score: 8  Pain Location: L foot Pain Descriptors / Indicators: Discomfort;Grimacing Pain Intervention(s): Monitored during session;Repositioned    Home Living  Prior Function            PT Goals (current goals can now be found in the care plan section) Acute Rehab PT Goals Patient Stated Goal: "go to rehab." Progress towards PT goals: Progressing toward goals    Frequency    Min 3X/week      PT Plan Current plan remains appropriate    Co-evaluation              AM-PAC PT "6 Clicks" Mobility    Outcome Measure  Help needed turning from your back to your side while in a flat bed without using bedrails?: None Help needed moving from lying on your back to sitting on the side of a flat bed without using bedrails?: A Little Help needed moving to and from a bed to a chair (including a wheelchair)?: A Little Help needed standing up from a chair using your arms (e.g., wheelchair or bedside chair)?: A Little Help needed to walk in hospital room?: A Little Help needed climbing 3-5 steps with a railing? : Total 6 Click Score: 17    End of Session Equipment Utilized During Treatment: Gait belt Activity Tolerance: Patient limited by pain;Patient limited by fatigue Patient left: with call bell/phone within reach;in chair Nurse Communication: Mobility status PT Visit Diagnosis: Unsteadiness on feet (R26.81);Pain;Difficulty in walking, not elsewhere classified (R26.2) Pain - Right/Left: Left Pain - part of body: Ankle and joints of foot     Time: 0922-0956 PT Time Calculation (min) (ACUTE ONLY): 34 min  Charges:  $Gait Training: 8-22 mins                     Earney Navy, PTA Acute Rehabilitation Services Pager: [phone] Office: [phone]     Darliss Cheney 02/10/2020, 4:32 PM

## 2020-02-10 NOTE — Discharge Planning (Signed)
Notified [facility] that patient is discharging today to [facility]. Schedule is TTS 6:30. Left voicemail for case manager

## 2020-02-10 NOTE — Progress Notes (Addendum)
  Progress Note    02/10/2020 7:54 AM 3 Days Post-Op  Subjective:  Says her foot hurts today  Tm 99.6  Vitals:   02/10/20 0615 02/10/20 0639  BP: (!) 95/52 109/81  Pulse: 62   Resp:    Temp:    SpO2: 97%     Physical Exam: Cardiac:  regular Lungs:  Non labored Incisions:  Toe amp site unchanged from yesterday and healing.   Extremities:  +doppler signals right DP/PT/peroneal;  Excellent thrill left arm fistula  CBC    Component Value Date/Time   WBC 8.2 02/10/2020 0233   RBC 3.12 (L) 02/10/2020 0233   HGB 8.1 (L) 02/10/2020 0233   HCT 27.5 (L) 02/10/2020 0233   HCT 22.0 (L) 10/06/2019 0021   PLT 261 02/10/2020 0233   MCV 88.1 02/10/2020 0233   MCH 26.0 02/10/2020 0233   MCHC 29.5 (L) 02/10/2020 0233   RDW 19.6 (H) 02/10/2020 0233   LYMPHSABS 1.2 02/10/2020 0233   MONOABS 1.2 (H) 02/10/2020 0233   EOSABS 0.3 02/10/2020 0233   BASOSABS 0.0 02/10/2020 0233    BMET    Component Value Date/Time   NA 137 02/10/2020 0233   K 4.6 02/10/2020 0233   CL 98 02/10/2020 0233   CO2 25 02/10/2020 0233   GLUCOSE 164 (H) 02/10/2020 0233   BUN 42 (H) 02/10/2020 0233   CREATININE 5.89 (H) 02/10/2020 0233   CREATININE 2.62 (H) 04/29/2016 1629   CALCIUM 8.0 (L) 02/10/2020 0233   GFRNONAA 7 (L) 02/10/2020 0233   GFRAA 8 (L) 02/10/2020 0233    INR    Component Value Date/Time   INR 1.2 08/25/2019 0433     Intake/Output Summary (Last 24 hours) at 02/10/2020 0754 Last data filed at 02/09/2020 2100 Gross per 24 hour  Intake 600 ml  Output 1000 ml  Net -400 ml     Assessment:  69 y.o. female is s/p:  revision of left arm cephalic vein AV fistula with transposition and Left great toe amputation   3 Days Post-Op  Plan: -pt with +DP/peroneal and PT doppler signal on the right.  Heel weight bearing only with Darco shoe.  -toe amp looks good and unchanged from yesterday-continue dry dressing -fistula with excellent thrill -will have pt return to see Dr. Donzetta Matters & we  will arrange appt for 3 weeks to check toe amp -will not follow actively-please call if there are any questions.   Leontine Locket, PA-C [facility] [phone] 02/10/2020 7:54 AM   I have interviewed and examined patient with PA and agree with assessment and plan above.   Lusia Greis C. Donzetta Matters, MD [facility] [place] Office: [phone] Pager: [phone]

## 2020-02-21 ENCOUNTER — Emergency Department (HOSPITAL_COMMUNITY): Payer: Medicare (Managed Care)

## 2020-02-21 ENCOUNTER — Other Ambulatory Visit: Payer: Self-pay

## 2020-02-21 ENCOUNTER — Inpatient Hospital Stay (HOSPITAL_COMMUNITY)
Admission: EM | Admit: 2020-02-21 | Discharge: 2020-02-28 | DRG: 377 | Disposition: A | Discharge status: Skilled Nursing Facility | Payer: Medicare (Managed Care) | Source: Skilled Nursing Facility | Attending: Family Medicine | Admitting: Family Medicine

## 2020-02-21 ENCOUNTER — Encounter (HOSPITAL_COMMUNITY): Payer: Self-pay

## 2020-02-21 DIAGNOSIS — Z833 Family history of diabetes mellitus: Secondary | ICD-10-CM

## 2020-02-21 DIAGNOSIS — M19012 Primary osteoarthritis, left shoulder: Secondary | ICD-10-CM | POA: Diagnosis present

## 2020-02-21 DIAGNOSIS — Z8349 Family history of other endocrine, nutritional and metabolic diseases: Secondary | ICD-10-CM

## 2020-02-21 DIAGNOSIS — I2510 Atherosclerotic heart disease of native coronary artery without angina pectoris: Secondary | ICD-10-CM | POA: Diagnosis present

## 2020-02-21 DIAGNOSIS — K31819 Angiodysplasia of stomach and duodenum without bleeding: Secondary | ICD-10-CM | POA: Diagnosis present

## 2020-02-21 DIAGNOSIS — H409 Unspecified glaucoma: Secondary | ICD-10-CM | POA: Diagnosis present

## 2020-02-21 DIAGNOSIS — N2581 Secondary hyperparathyroidism of renal origin: Secondary | ICD-10-CM | POA: Diagnosis present

## 2020-02-21 DIAGNOSIS — Z886 Allergy status to analgesic agent status: Secondary | ICD-10-CM

## 2020-02-21 DIAGNOSIS — K648 Other hemorrhoids: Secondary | ICD-10-CM | POA: Diagnosis present

## 2020-02-21 DIAGNOSIS — Z841 Family history of disorders of kidney and ureter: Secondary | ICD-10-CM

## 2020-02-21 DIAGNOSIS — Z794 Long term (current) use of insulin: Secondary | ICD-10-CM | POA: Diagnosis not present

## 2020-02-21 DIAGNOSIS — K5900 Constipation, unspecified: Secondary | ICD-10-CM | POA: Diagnosis present

## 2020-02-21 DIAGNOSIS — Z7951 Long term (current) use of inhaled steroids: Secondary | ICD-10-CM

## 2020-02-21 DIAGNOSIS — I4892 Unspecified atrial flutter: Secondary | ICD-10-CM | POA: Diagnosis present

## 2020-02-21 DIAGNOSIS — D649 Anemia, unspecified: Secondary | ICD-10-CM | POA: Diagnosis present

## 2020-02-21 DIAGNOSIS — I480 Paroxysmal atrial fibrillation: Secondary | ICD-10-CM | POA: Diagnosis present

## 2020-02-21 DIAGNOSIS — I739 Peripheral vascular disease, unspecified: Secondary | ICD-10-CM | POA: Diagnosis not present

## 2020-02-21 DIAGNOSIS — I252 Old myocardial infarction: Secondary | ICD-10-CM

## 2020-02-21 DIAGNOSIS — K449 Diaphragmatic hernia without obstruction or gangrene: Secondary | ICD-10-CM | POA: Diagnosis not present

## 2020-02-21 DIAGNOSIS — I5042 Chronic combined systolic (congestive) and diastolic (congestive) heart failure: Secondary | ICD-10-CM | POA: Diagnosis present

## 2020-02-21 DIAGNOSIS — Z8619 Personal history of other infectious and parasitic diseases: Secondary | ICD-10-CM

## 2020-02-21 DIAGNOSIS — K5520 Angiodysplasia of colon without hemorrhage: Secondary | ICD-10-CM

## 2020-02-21 DIAGNOSIS — I959 Hypotension, unspecified: Secondary | ICD-10-CM | POA: Diagnosis present

## 2020-02-21 DIAGNOSIS — F17210 Nicotine dependence, cigarettes, uncomplicated: Secondary | ICD-10-CM | POA: Diagnosis present

## 2020-02-21 DIAGNOSIS — N186 End stage renal disease: Secondary | ICD-10-CM | POA: Diagnosis present

## 2020-02-21 DIAGNOSIS — E1122 Type 2 diabetes mellitus with diabetic chronic kidney disease: Secondary | ICD-10-CM

## 2020-02-21 DIAGNOSIS — Z20822 Contact with and (suspected) exposure to covid-19: Secondary | ICD-10-CM | POA: Diagnosis present

## 2020-02-21 DIAGNOSIS — Z7902 Long term (current) use of antithrombotics/antiplatelets: Secondary | ICD-10-CM

## 2020-02-21 DIAGNOSIS — K3184 Gastroparesis: Secondary | ICD-10-CM | POA: Diagnosis present

## 2020-02-21 DIAGNOSIS — Z7982 Long term (current) use of aspirin: Secondary | ICD-10-CM

## 2020-02-21 DIAGNOSIS — E1151 Type 2 diabetes mellitus with diabetic peripheral angiopathy without gangrene: Secondary | ICD-10-CM | POA: Diagnosis present

## 2020-02-21 DIAGNOSIS — Z823 Family history of stroke: Secondary | ICD-10-CM

## 2020-02-21 DIAGNOSIS — M19011 Primary osteoarthritis, right shoulder: Secondary | ICD-10-CM | POA: Diagnosis present

## 2020-02-21 DIAGNOSIS — D631 Anemia in chronic kidney disease: Secondary | ICD-10-CM | POA: Diagnosis present

## 2020-02-21 DIAGNOSIS — Z79899 Other long term (current) drug therapy: Secondary | ICD-10-CM

## 2020-02-21 DIAGNOSIS — F411 Generalized anxiety disorder: Secondary | ICD-10-CM | POA: Diagnosis present

## 2020-02-21 DIAGNOSIS — E1142 Type 2 diabetes mellitus with diabetic polyneuropathy: Secondary | ICD-10-CM | POA: Diagnosis present

## 2020-02-21 DIAGNOSIS — Z8269 Family history of other diseases of the musculoskeletal system and connective tissue: Secondary | ICD-10-CM

## 2020-02-21 DIAGNOSIS — Z95820 Peripheral vascular angioplasty status with implants and grafts: Secondary | ICD-10-CM

## 2020-02-21 DIAGNOSIS — Z8719 Personal history of other diseases of the digestive system: Secondary | ICD-10-CM

## 2020-02-21 DIAGNOSIS — E1143 Type 2 diabetes mellitus with diabetic autonomic (poly)neuropathy: Secondary | ICD-10-CM | POA: Diagnosis present

## 2020-02-21 DIAGNOSIS — K635 Polyp of colon: Secondary | ICD-10-CM | POA: Diagnosis present

## 2020-02-21 DIAGNOSIS — E785 Hyperlipidemia, unspecified: Secondary | ICD-10-CM | POA: Diagnosis present

## 2020-02-21 DIAGNOSIS — D62 Acute posthemorrhagic anemia: Secondary | ICD-10-CM | POA: Diagnosis present

## 2020-02-21 DIAGNOSIS — K219 Gastro-esophageal reflux disease without esophagitis: Secondary | ICD-10-CM | POA: Diagnosis not present

## 2020-02-21 DIAGNOSIS — K922 Gastrointestinal hemorrhage, unspecified: Secondary | ICD-10-CM

## 2020-02-21 DIAGNOSIS — Z8249 Family history of ischemic heart disease and other diseases of the circulatory system: Secondary | ICD-10-CM

## 2020-02-21 DIAGNOSIS — R4182 Altered mental status, unspecified: Secondary | ICD-10-CM

## 2020-02-21 DIAGNOSIS — K5521 Angiodysplasia of colon with hemorrhage: Secondary | ICD-10-CM | POA: Diagnosis present

## 2020-02-21 DIAGNOSIS — Z992 Dependence on renal dialysis: Secondary | ICD-10-CM

## 2020-02-21 DIAGNOSIS — Z6839 Body mass index (BMI) 39.0-39.9, adult: Secondary | ICD-10-CM

## 2020-02-21 DIAGNOSIS — Z951 Presence of aortocoronary bypass graft: Secondary | ICD-10-CM

## 2020-02-21 DIAGNOSIS — Z89412 Acquired absence of left great toe: Secondary | ICD-10-CM

## 2020-02-21 DIAGNOSIS — I132 Hypertensive heart and chronic kidney disease with heart failure and with stage 5 chronic kidney disease, or end stage renal disease: Secondary | ICD-10-CM | POA: Diagnosis present

## 2020-02-21 DIAGNOSIS — K222 Esophageal obstruction: Secondary | ICD-10-CM | POA: Diagnosis present

## 2020-02-21 DIAGNOSIS — Z9181 History of falling: Secondary | ICD-10-CM

## 2020-02-21 DIAGNOSIS — M109 Gout, unspecified: Secondary | ICD-10-CM | POA: Diagnosis present

## 2020-02-21 DIAGNOSIS — J45909 Unspecified asthma, uncomplicated: Secondary | ICD-10-CM | POA: Diagnosis present

## 2020-02-21 DIAGNOSIS — Z8673 Personal history of transient ischemic attack (TIA), and cerebral infarction without residual deficits: Secondary | ICD-10-CM

## 2020-02-21 DIAGNOSIS — F329 Major depressive disorder, single episode, unspecified: Secondary | ICD-10-CM | POA: Diagnosis present

## 2020-02-21 HISTORY — DX: Acute pulmonary edema: J81.0

## 2020-02-21 LAB — POC OCCULT BLOOD, ED: Fecal Occult Bld: POSITIVE — AB

## 2020-02-21 LAB — RETICULOCYTES
Immature Retic Fract: 39.1 % — ABNORMAL HIGH (ref 2.3–15.9)
RBC.: 1.44 MIL/uL — ABNORMAL LOW (ref 3.87–5.11)
Retic Count, Absolute: 116.9 10*3/uL (ref 19.0–186.0)
Retic Ct Pct: 8.1 % — ABNORMAL HIGH (ref 0.4–3.1)

## 2020-02-21 LAB — GLUCOSE, CAPILLARY: Glucose-Capillary: 139 mg/dL — ABNORMAL HIGH (ref 70–99)

## 2020-02-21 LAB — PREPARE RBC (CROSSMATCH)

## 2020-02-21 LAB — BASIC METABOLIC PANEL
Anion gap: 17 — ABNORMAL HIGH (ref 5–15)
BUN: 83 mg/dL — ABNORMAL HIGH (ref 8–23)
CO2: 24 mmol/L (ref 22–32)
Calcium: 7.6 mg/dL — ABNORMAL LOW (ref 8.9–10.3)
Chloride: 95 mmol/L — ABNORMAL LOW (ref 98–111)
Creatinine, Ser: 7.67 mg/dL — ABNORMAL HIGH (ref 0.44–1.00)
GFR calc Af Amer: 6 mL/min — ABNORMAL LOW (ref >60)
GFR calc non Af Amer: 5 mL/min — ABNORMAL LOW (ref >60)
Glucose, Bld: 167 mg/dL — ABNORMAL HIGH (ref 70–99)
Potassium: 4.5 mmol/L (ref 3.5–5.1)
Sodium: 136 mmol/L (ref 135–145)

## 2020-02-21 LAB — CBC WITH DIFFERENTIAL/PLATELET
Abs Immature Granulocytes: 0.09 10*3/uL — ABNORMAL HIGH (ref 0.00–0.07)
Basophils Absolute: 0 10*3/uL (ref 0.0–0.1)
Basophils Relative: 0 %
Eosinophils Absolute: 0.4 10*3/uL (ref 0.0–0.5)
Eosinophils Relative: 3 %
HCT: 14.1 % — ABNORMAL LOW (ref 36.0–46.0)
Hemoglobin: 4 g/dL — CL (ref 12.0–15.0)
Immature Granulocytes: 1 %
Lymphocytes Relative: 13 %
Lymphs Abs: 1.4 10*3/uL (ref 0.7–4.0)
MCH: 26.7 pg (ref 26.0–34.0)
MCHC: 28.4 g/dL — ABNORMAL LOW (ref 30.0–36.0)
MCV: 94 fL (ref 80.0–100.0)
Monocytes Absolute: 0.7 10*3/uL (ref 0.1–1.0)
Monocytes Relative: 7 %
Neutro Abs: 7.8 10*3/uL — ABNORMAL HIGH (ref 1.7–7.7)
Neutrophils Relative %: 76 %
Platelets: 370 10*3/uL (ref 150–400)
RBC: 1.5 MIL/uL — ABNORMAL LOW (ref 3.87–5.11)
RDW: 23.4 % — ABNORMAL HIGH (ref 11.5–15.5)
WBC: 10.3 10*3/uL (ref 4.0–10.5)
nRBC: 2.8 % — ABNORMAL HIGH (ref 0.0–0.2)

## 2020-02-21 LAB — FERRITIN: Ferritin: 123 ng/mL (ref 11–307)

## 2020-02-21 LAB — IRON AND TIBC
Iron: 134 ug/dL (ref 28–170)
Saturation Ratios: 55 % — ABNORMAL HIGH (ref 10.4–31.8)
TIBC: 242 ug/dL — ABNORMAL LOW (ref 250–450)
UIBC: 108 ug/dL

## 2020-02-21 LAB — FOLATE: Folate: 20.2 ng/mL (ref >5.9)

## 2020-02-21 LAB — SARS CORONAVIRUS 2 (TAT 6-24 HRS): SARS Coronavirus 2: NEGATIVE

## 2020-02-21 LAB — VITAMIN B12: Vitamin B-12: 468 pg/mL (ref 180–914)

## 2020-02-21 MED ORDER — PRAVASTATIN SODIUM 40 MG PO TABS: 40.0000 mg | ORAL_TABLET | Freq: Every day | ORAL | Status: DC

## 2020-02-21 MED ORDER — ACETAMINOPHEN 325 MG PO TABS
650.0000 mg | ORAL_TABLET | Freq: Four times a day (QID) | ORAL | Status: DC | PRN
  Administered 2020-02-22 – 2020-02-24 (×2): 650 mg via ORAL
  Filled 2020-02-21 (×2): qty 2

## 2020-02-21 MED ORDER — INSULIN ASPART 100 UNIT/ML ~~LOC~~ SOLN: 0.0000 [IU] | Freq: Three times a day (TID) | SUBCUTANEOUS | Status: DC

## 2020-02-21 MED ORDER — PANTOPRAZOLE SODIUM 40 MG IV SOLR
40.0000 mg | Freq: Two times a day (BID) | INTRAVENOUS | Status: DC
  Administered 2020-02-21 – 2020-02-22 (×2): 40 mg via INTRAVENOUS
  Filled 2020-02-21 (×2): qty 40

## 2020-02-21 MED ORDER — ALBUTEROL SULFATE (2.5 MG/3ML) 0.083% IN NEBU: 3.0000 mL | INHALATION_SOLUTION | Freq: Four times a day (QID) | RESPIRATORY_TRACT | Status: DC | PRN

## 2020-02-21 MED ORDER — FERROUS SULFATE 325 (65 FE) MG PO TABS
325.0000 mg | ORAL_TABLET | Freq: Two times a day (BID) | ORAL | Status: DC
  Administered 2020-02-22: 325 mg via ORAL
  Filled 2020-02-21: qty 1

## 2020-02-21 MED ORDER — ALLOPURINOL 100 MG PO TABS
100.0000 mg | ORAL_TABLET | Freq: Two times a day (BID) | ORAL | Status: DC
  Administered 2020-02-21 – 2020-02-22 (×2): 100 mg via ORAL
  Filled 2020-02-21 (×2): qty 1

## 2020-02-21 MED ORDER — SODIUM CHLORIDE 0.9 % IV SOLN
10.0000 mL/h | Freq: Once | INTRAVENOUS | Status: AC
  Administered 2020-02-24: 11:00:00 10 mL/h via INTRAVENOUS

## 2020-02-21 MED ORDER — GABAPENTIN 300 MG PO CAPS
300.0000 mg | ORAL_CAPSULE | Freq: Every day | ORAL | Status: DC
  Administered 2020-02-21 – 2020-02-27 (×7): 300 mg via ORAL
  Filled 2020-02-21 (×7): qty 1

## 2020-02-21 MED ORDER — NITROGLYCERIN 0.4 MG SL SUBL: 0.4000 mg | SUBLINGUAL_TABLET | SUBLINGUAL | Status: DC | PRN

## 2020-02-21 MED ORDER — LATANOPROST 0.005 % OP SOLN
1.0000 [drp] | Freq: Every day | OPHTHALMIC | Status: DC
  Administered 2020-02-24 – 2020-02-27 (×4): 1 [drp] via OPHTHALMIC
  Filled 2020-02-21: qty 2.5

## 2020-02-21 MED ORDER — MOMETASONE FURO-FORMOTEROL FUM 100-5 MCG/ACT IN AERO: 2.0000 | INHALATION_SPRAY | Freq: Two times a day (BID) | RESPIRATORY_TRACT | Status: DC | PRN

## 2020-02-21 MED ORDER — RENA-VITE PO TABS
1.0000 | ORAL_TABLET | Freq: Every day | ORAL | Status: DC
  Administered 2020-02-21 – 2020-02-27 (×7): 1 via ORAL
  Filled 2020-02-21 (×7): qty 1

## 2020-02-21 MED ORDER — SUCROFERRIC OXYHYDROXIDE 500 MG PO CHEW
1000.0000 mg | CHEWABLE_TABLET | Freq: Three times a day (TID) | ORAL | Status: DC
  Administered 2020-02-22 – 2020-02-28 (×13): 1000 mg via ORAL
  Filled 2020-02-21 (×15): qty 2

## 2020-02-21 MED ORDER — ACETAMINOPHEN 650 MG RE SUPP: 650.0000 mg | Freq: Four times a day (QID) | RECTAL | Status: DC | PRN

## 2020-02-21 MED ORDER — BUPROPION HCL ER (SR) 150 MG PO TB12
150.0000 mg | ORAL_TABLET | Freq: Two times a day (BID) | ORAL | Status: DC
  Administered 2020-02-21 – 2020-02-27 (×11): 150 mg via ORAL
  Filled 2020-02-21 (×15): qty 1

## 2020-02-21 NOTE — H&P (Addendum)
[facility] Admission History and Physical Service Pager: [phone]  Patient name: [name] [name] Medical record number: [id number] Date of birth: 1951-10-22 Age: 69 y.o. Gender: female  Primary Care Provider: [facility] Consultants: GI Code Status: Full (confirmed on admission) Preferred Emergency Contact: husband, Alice Reichert  Chief Complaint: Sluggish  Assessment and Plan: [name] [name] is a 69 y.o. female presenting with symptomatic anemia. 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  Acute on chronic symptomatic Anemia, h/o GI bleed Patient sent from [facility] for anemia, hemoglobin there was 6.2.  Patient has a history of end-stage renal disease on hemodialysis, as well as a history of chronic microcytic anemia.  On presentation to the ED her hemoglobin was 4.0, RBC low at 1.44, reticulocyte count absolute 116.9, immature recheck Hemocyte fraction increased to 39.1.  She states that she feels sluggish, but otherwise no complaints.  No chest pain, no palpitations, no shortness of breath, no lightheadedness, no dizziness, no falls.  A week ago patient had her left great toe amputated due to complications from diabetes, no evidence of bleeding from the site; hemoglobin at time of discharge on February 12 was 8.1.  She denies melena, or frank blood in stools, or vaginal bleeding. She is post-menopausal.  Patient does take aspirin.  On rectal exam patient had dark-colored stool per EDP and a positive FOBT. Patient had similar presentation in August and October 2020, respectively; she received an outpatient upper EGD in August, 2020. Hgb at those times dropped to 7, she received  transfusions. During the October admission, patient had an Upper EGD which revealed an esophageal stricture, 5cm hiatal hernia and cameron erosion, but no active bleeding. Patient did not tolerate bowel prep during that admission, so colonoscopy was not done. Patient's last colonoscopy was 2017 and showed a nonbleeding colonic AVM. GI during admission in October posited that this is likely chronic blood loss and patient may become transfusion dependent. EKG with no change from prior, cardiac monitoring in ED reveals NSR with occasional PVCs. Patient has h/o paroxysmal A fib w/ RVR and A. Flutter. EKG shows NSR w/ LVH with secondary repolarization abnormality. No STEMI. CXR with mild cardiomegaly, pulmonary vascular congestion, and mildly increased interstitial markings of both lung bases which could be chronic lung changes vs interstitial edema. BP ranging from normotensive to hypotensive, SBP 77-115, DBP 49-77, most recently 86/57. Patient's last HD was Saturday, missed HD today (TTS schedule), plan to contact nephrology for HD and with no additional fluids save for pRBC transfusion. Also plan to watch potassium closely. BMP WNL: Na 136, K 4.5, Cl 95, Co2 24, Cr 7.67. AG notable at 75, please see ESRD problem below. Patient takes Fe supplements PO and iron panel at admission grossly normal: Iron 134, ferritin 123, folate 20.2. Vitamin B12 WNL. GI has been consulted and will see the patient.  Patient is currently receiving 2 units of pRBC transfusion. Most likely diagnosis is chronic anemia 2/2 GI bleed.  -admit to Med Surg, FPTS, attending Dr. McDiarmid -CBC s/p transfusion, AM CBC -GI consult, appreciate recommendations -NPO until GI recommendations available -Vital signs per routine -Up with assistance -PT/OT -SCDs for DVT ppx - IV PPI - consider aranesp or  IV iron infusion, would defer to nephro.  ESRD on HD Patient is on a Tuesday, Thursday, Saturday schedule.  Last HD was on Saturday, she did not  receive her scheduled HD today.  Patient received a revision of left arm cephalic vein AV fistula on February 07, 2020 by vascular surgery.  Thrill appropriately apparent at fistula site. BMP within normal limits: Na 136, K 4.5, Cl 95, Co2 24, Cr 7.67. AG notable at 42.  We will consult nephrology for HD, hopefully tonight or tomorrow.  Will watch for gentle volume repletion with transfusion due to intermittent hypotension. Also watch potassium given missed HD and need for transfusion. -Consult nephrology, appreciate recommendations -AM BMP  CAD s/p CABG CHF  H/O PAF/A.flutter CXR with cardiomegaly, patient does not have chest pain, no evidence of A. fib or a flutter. Home medications include aspirin, plavix and pravastatin. Patient also has nitroglycerin SL for chest pain at home. Not on anticoagulation due to h/o GI bleed and frequent falls. -Hold home medications aspirin, plavix -Continue NG SL PRN for chest pain -Continue pravastatin 40 mg daily  HTN Intermittently hypotensive as above. Home medication is Lopressor, but due to intermittent hypotension, will hold home medication for now.  PAD  s/p L great toe amputation Patient had aortogram and atherectomy of the left SFA and popliteal arteries, as well as stenting of the left behind the knee popliteal artery on February 06, 2020.  She subsequently underwent left great toe amputation on February 07, 2020 due to gangrene.  She also had a revision of the left arm cephalic vein AV fistula on February 07, 2020 by vascular surgery. - Consult wound RN for dressing changes - hold home ASA, plavix.  DMT2 Patient is currently NPO. Last A1c was 5 in January 2021. Home medications include Novolog and Humalog 2U SQ TID AC for both.  - sensitive SSI with CBGs with meals once able to take PO, otherwise monitor with BMP.  Peripheral Neuropathy Continue home med gabapentin 300 mg p.o. nightly  GERD Home medication Protonix p.o. -Start IV protonix    Asthma Home medications include ProAir, Atrovent, and Dulera. -Continue Dulera 2puffs q4h PRN wheezing, albuterol 2 puff q6h PRN wheezing  Polysubstance use Patient smokes cigarettes, recommend more smoking cessation counseling, nicotine patch as needed.  Patient endorses occasional cocaine use, last use was in July 2020.  MDD, GAD Stable. Continue home medication Wellbutrin 150 mg BID.   Gout Continue home meds: allopurinol 100 mg p.o. twice daily - consider dose adjustment outpatient given ESRD  Afib/flutter New onset noted at 09/2019 admission for GI bleed. EKG NSR on admission. Not anticoagulated due to h/o GI bleeds and frequent falls. CHADSVASc 8 with stroke risk >10%, HAS-BLED 5 suggesting alternative to anticoagulation.    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 RV with normal systolic function.   FEN/GI: NPO pending GI recommendations Prophylaxis: SCDs for DVT ppx  Disposition: admit to med-surg pending medical work up  History of Present Illness:  Shevelle Smither Bhatt is a 69 y.o. female presenting with feeling sluggish for the past 2 days. She denies chest pain, difficulties breathing, coughing, runny nose. She recently had L great toe amputation and vascular procedure on 02/07/2020, she denies any current pain or increased bleeding from surgical site. She takes chronic iron and notes dark tarry stools normally. She missed dialysis today due to not feeling well. Denies any chest pain or irregular heart beats. Denies any family history of  colon cancer. She has had no sick contacts, no sore throat/cough/fever/n/v/d, vaginal bleeding. No CP, SOB, dizziness, or falls.  Review Of Systems: Per HPI with the following additions:   Review of Systems  Constitutional: Positive for malaise/fatigue. Negative for chills, diaphoresis, fever and weight loss.  HENT: Negative for congestion, sinus pain and sore throat.   Eyes: Negative.   Respiratory: Negative  for cough and shortness of breath.   Cardiovascular: Negative for chest pain and palpitations.  Gastrointestinal: Positive for melena. Negative for abdominal pain, blood in stool, constipation, diarrhea, nausea and vomiting.  Genitourinary: Negative.   Musculoskeletal: Negative for back pain, joint pain and myalgias.  Skin: Negative.   Neurological: Negative for dizziness and headaches.  Endo/Heme/Allergies: Does not bruise/bleed easily.  Psychiatric/Behavioral: Positive for substance abuse.    Patient Active Problem List   Diagnosis Date Noted  . Chest pain 01/31/2020  . Acute encephalopathy 01/02/2020  . AMS (altered mental status) 01/01/2020  . Acute pulmonary edema (HCC)   . Recurrent falls 10/09/2019  . Generalized weakness 10/09/2019  . Unspecified atrial fibrillation ([place]) 10/08/2019  . Acute GI bleeding 10/05/2019  . Hyperkalemia   . Depression   . Esophageal stricture   . Diabetic gastroparesis ([place])   . Upper GI bleed 08/16/2019  . Elevated troponin 04/27/2019  . Hiatal hernia   . Iron deficiency anemia   . Microcytic anemia 02/05/2016  . Acute on chronic renal failure ([place])   . CAD in native artery   . Substance abuse ([place])   . Chronic combined systolic and diastolic CHF (congestive heart failure) ([place])   . ESRD (end stage renal disease) ([place])   . Obesity, Class III, BMI 40-49.9 (morbid obesity) ([place])   . Type 2 diabetes mellitus with chronic kidney disease on chronic dialysis, with long-term current use of insulin ([place]) 10/18/2014  . S/P CABG (coronary artery bypass graft) 09/21/2013  . Arthritis of right shoulder region 03/23/2013  . Tobacco abuse   . Coronary artery disease   . NSTEMI (non-ST elevated myocardial infarction) ([place]) 06/29/2012  . History of cocaine abuse ([place]) 06/29/2012  . Essential hypertension 06/29/2012  . Glaucoma   . Hyperlipidemia   . History of CVA (cerebrovascular accident)     Past Medical History: Past Medical History:   Diagnosis Date  . Abscess   . Acute blood loss anemia 08/17/2019  . Acute respiratory failure ([place]) 10/18/2014  . Acute respiratory failure with hypoxia and hypercapnia ([place]) 06/01/2019  . Anemia 08/2016  . Angiodysplasia of colon   . Arthritis of left shoulder region 03/23/2013  . Bleeding gastrointestinal   . Cardiomegaly 05/2019  . Chest pain 04/17/2016  . Chronic combined systolic and diastolic CHF (congestive heart failure) (HCC)    a. EF 40-45%, mild LVH, mid apicalanteroseptal and apical HK.  . CKD (chronic kidney disease), stage III   . Cocaine abuse ([place])    crack cocaine heavily until 2008 then sporadic use since then  . Coronary artery disease    a. 06/2012 NSTEMI/CABG x 3 (LIMA->LAD, VG->OM2, VG->LCX);  b. 04/2015 MV: EF<30%, mid ant, apicalanterior, apical infarct;  c. 04/2015 Cath: LM nl, LAD 90p, LCX 19m, OM1 min irregs, RCA mild dzs, LIMA->LAD nl w/ dist LAD dzs, VG->OM2 nl, VG->LCX nl-->Med Rx.  . CVA (cerebral infarction)    a. right internal capsule stroke in 12/2006  . Demand ischemia ([place])   . Diabetes mellitus    diagnosed in 2008  . Elevated troponin 04/27/2019  . Essential  hypertension   . Glaucoma   . Gout   . Heme positive stool   . HFrEF (heart failure with reduced ejection fraction) ([place])   . Hyperlipidemia   . Hyperparathyroidism, secondary renal ([place])   . Hypertensive crisis 06/02/2019  . Left-sided sensory deficit present   . Lobar pneumonia ([place]) 04/27/2019  . Obesity, morbid ([place])   . Pneumonia   . Pulmonary edema 05/2019  . PVD (peripheral vascular disease) ([place])    a. 06/2012 ABI's: R - 0.73, L - 0.71.  Marland Kitchen Renal mass, right   . Sepsis ([place]) 04/27/2019  . Shortness of breath dyspnea   . Stroke ([place])   . Thrombocytosis ([place]) 04/17/2016  . Thyroid nodule    FNA in 9622 showed follicular cells but not definate neoplasm  . Tobacco abuse   . Trichomoniasis     Past Surgical History: Past Surgical History:  Procedure Laterality Date  . ABDOMINAL  AORTOGRAM W/LOWER EXTREMITY Bilateral 02/06/2020   Procedure: ABDOMINAL AORTOGRAM W/LOWER EXTREMITY;  Surgeon: Waynetta Sandy, MD;  Location: [place] [facility];  Service: Cardiovascular;  Laterality: Bilateral;  . AMPUTATION Left 02/07/2020   Procedure: AMPUTATION DIGIT - LEFT GREAT TOE;  Surgeon: Waynetta Sandy, MD;  Location: [place];  Service: Vascular;  Laterality: Left;  . AV FISTULA PLACEMENT Left 08/25/2019   Procedure: ARTERIOVENOUS (AV) BRACHIOCEPHALIC FISTULA CREATION;  Surgeon: Waynetta Sandy, MD;  Location: [place];  Service: Vascular;  Laterality: Left;  . CARDIAC CATHETERIZATION    . CARDIAC CATHETERIZATION N/A 05/17/2015   Procedure: Left Heart Cath and Cors/Grafts Angiography;  Surgeon: [name], MD;  Location: [place] CV [facility];  Service: Cardiovascular;  Laterality: N/A;  . COLONOSCOPY WITH PROPOFOL N/A 04/21/2016   Procedure: COLONOSCOPY WITH PROPOFOL;  Surgeon: Irene Shipper, MD;  Location: [place];  Service: Endoscopy;  Laterality: N/A;  . CORONARY ARTERY BYPASS GRAFT  07/09/2012   Procedure: CORONARY ARTERY BYPASS GRAFTING (CABG);  Surgeon: Ivin Poot, MD;  Location: [place];  Service: Open Heart Surgery;  Laterality: N/A;  . ENTEROSCOPY N/A 10/07/2019   Procedure: ENTEROSCOPY;  Surgeon: Jackquline Denmark, MD;  Location[facility] END[facility]COPY;  Service: Endoscopy;  Laterality: N/A;  . ESOPHAGOGASTRODUODENOSCOPY N/A 04/20/2016   Procedure: ESOPHAGOGASTRODUODENOSCOPY (EGD);  Surgeon: Gatha Mayer, MD;  Location[facility] ENDOSCOPY;  Service: Endoscopy;  Laterality: N/A;  . ESOPHAGOGASTRODUODENOSCOPY (EGD) WITH PROPOFOL N/A 08/17/2019   Procedure: ESOPHAGOGASTRODUODENOSCOPY (EGD) WITH PROPOFOL;  Surgeon: Irene Shipper, MD;  Location: [facility] ENDOSCOPY;  Service: Endoscopy;  Laterality: N/A;  . INSERTION OF DIALYSIS CATHETER Right 08/25/2019   Procedure: INSERTION OF DIALYSIS CATHETER;  Surgeon: Waynetta Sandy, MD;  Location: [place];  Service: Vascular;   Laterality: Right;  . IR FLUORO GUIDE CV LINE RIGHT  08/19/2019  . IR US GUIDE VASC ACCESS RIGHT  08/19/2019  . LEFT HEART CATHETERIZATION WITH CORONARY ANGIOGRAM N/A 06/29/2012   Procedure: LEFT HEART CATHETERIZATION WITH CORONARY ANGIOGRAM;  Surgeon: Peter M Martinique, MD;  Location: [facility] CATH LAB;  Service: Cardiovascular;  Laterality: N/A;  . PERIPHERAL VASCULAR ATHERECTOMY Left 02/06/2020   Procedure: PERIPHERAL VASCULAR ATHERECTOMY;  Surgeon: Waynetta Sandy, MD;  Location: [place] CV [facility];  Service: Cardiovascular;  Laterality: Left;  SFA  . PERIPHERAL VASCULAR INTERVENTION Left 02/06/2020   Procedure: PERIPHERAL VASCULAR INTERVENTION;  Surgeon: Waynetta Sandy, MD;  Location: [place] CV LAB;  Service: Cardiovascular;  Laterality: Left;  SFA  . REVISON OF ARTERIOVENOUS FISTULA Left 02/07/2020   Procedure: REVISON  OF ARTERIOVENOUS FISTULA;  Surgeon: Waynetta Sandy, MD;  Location: [place];  Service: Vascular;  Laterality: Left;  . STERNAL WOUND DEBRIDEMENT  08/17/2012   Procedure: STERNAL WOUND DEBRIDEMENT;  Surgeon: Ivin Poot, MD;  Location: [place];  Service: Thoracic;  Laterality: N/A;  wound vac application  . STERNAL WOUND DEBRIDEMENT  08/24/2012   Procedure: STERNAL WOUND DEBRIDEMENT;  Surgeon: Ivin Poot, MD;  Location: [place];  Service: Thoracic;  Laterality: N/A;  . STERNAL WOUND DEBRIDEMENT  09/01/2012   Procedure: STERNAL WOUND DEBRIDEMENT;  Surgeon: Ivin Poot, MD;  Location: [place];  Service: Thoracic;  Laterality: N/A;  . STERNAL WOUND DEBRIDEMENT  09/20/2012   Procedure: STERNAL WOUND DEBRIDEMENT;  Surgeon: Ivin Poot, MD;  Location: [facility] OR;  Service: Thoracic;  Laterality: N/A;  wound vac change  . SUBMUCOSAL TATTOO INJECTION  10/07/2019   Procedure: SUBMUCOSAL TATTOO INJECTION;  Surgeon: Jackquline Denmark, MD;  Location: [facility] ENDOSCOPY;  Service: Endoscopy;;    Social History: Social History   Tobacco Use  . Smoking status: Current Some Day  Smoker    Packs/day: 0.25    Years: 50.00    Pack years: 12.50    Types: Cigarettes  . Smokeless tobacco: Never Used  Substance Use Topics  . Alcohol use: No    Alcohol/week: 0.0 standard drinks  . Drug use: Yes    Types: Cocaine    Comment: pt denies at current time   Additional social history: Patient occasionally uses cocaine. Last reported use July 2020. Please also refer to relevant sections of EMR.  Family History: Family History  Problem Relation Age of Onset  . Diabetes Mother   . Hypertension Mother   . Cancer Mother   . Hyperlipidemia Father   . Hypertension Father   . Kidney disease Father   . Gout Father   . Cerebrovascular Accident Father   . Other Other        no known family CAD   Allergies and Medications: Allergies  Allergen Reactions  . Naproxen Rash   No current facility-administered medications on file prior to encounter.   Current Outpatient Medications on File Prior to Encounter  Medication Sig Dispense Refill  . allopurinol (ZYLOPRIM) 100 MG tablet Take 100 mg by mouth 2 (two) times daily.     Marland Kitchen aspirin EC 325 MG tablet Take 325 mg by mouth daily.    Marland Kitchen buPROPion (WELLBUTRIN SR) 150 MG 12 hr tablet Take 1 tablet (150 mg total) by mouth 2 (two) times daily. 60 tablet 0  . cetirizine (ZYRTEC) 10 MG tablet Take 10 mg by mouth daily as needed for allergies.     Marland Kitchen clopidogrel (PLAVIX) 75 MG tablet Take 1 tablet (75 mg total) by mouth daily with breakfast. 30 tablet 0  . ferrous sulfate 325 (65 FE) MG tablet Take 325 mg by mouth 2 (two) times daily with a meal.     . gabapentin (NEURONTIN) 300 MG capsule Take 1 capsule (300 mg total) by mouth at bedtime.    . insulin aspart (NOVOLOG) 100 UNIT/ML injection Inject 2 Units into the skin 3 (three) times daily with meals.    . insulin lispro (HUMALOG) 100 UNIT/ML injection Inject 2 Units into the skin 3 (three) times daily before meals.    . latanoprost (XALATAN) 0.005 % ophthalmic solution Place 1 drop into  both eyes at bedtime.    Marland Kitchen LINZESS 72 MCG capsule Take 72 mcg by mouth daily before breakfast.  3  . metoprolol tartrate (LOPRESSOR) 25 MG tablet Take 1 tablet (25 mg total) by mouth 2 (two) times daily.    . mometasone-formoterol (DULERA) 100-5 MCG/ACT AERO Inhale 2 puffs into the lungs every 4 (four) hours as needed for wheezing.    . multivitamin (RENA-VIT) TABS tablet Take 1 tablet by mouth at bedtime. 30 tablet 0  . nitroGLYCERIN (NITROSTAT) 0.4 MG SL tablet Place 1 tablet (0.4 mg total) under the tongue every 5 (five) minutes as needed for chest pain. 30 tablet 0  . oxyCODONE (OXY IR/ROXICODONE) 5 MG immediate release tablet Take 1 tablet (5 mg total) by mouth every 6 (six) hours as needed for moderate pain. 20 tablet 0  . polyethylene glycol (MIRALAX) 17 g packet Take 17 g by mouth daily as needed for moderate constipation. 14 each 0  . pravastatin (PRAVACHOL) 40 MG tablet Take 40 mg by mouth daily.    Marland Kitchen PROAIR HFA 108 (90 Base) MCG/ACT inhaler Inhale 2 puffs into the lungs every 6 (six) hours as needed for wheezing.   3  . TUMS 500 MG chewable tablet Chew 2 tablets by mouth at bedtime.    . VELPHORO 500 MG chewable tablet Chew 2 tablets (1,000 mg total) by mouth 3 (three) times daily with meals. 90 tablet 0  . pantoprazole (PROTONIX) 40 MG tablet Take 1 tablet (40 mg total) by mouth daily. (Patient not taking: Reported on 02/21/2020) 30 tablet 0    Objective: BP (!) 103/49   Pulse 66   Temp 97.6 F (36.4 C) (Oral)   Resp 16   Ht 5\' 8"  (1.727 m)   Wt 118.8 kg   SpO2 100%   BMI 39.84 kg/m  Exam: General: Older, obese, AA woman lying comfortably in bed, NAD Eyes: PERRLA ENTM: clear oropharynx Neck: supple Cardiovascular: RRR, no m/r/g, +1 pitting edema to mid shins on LEs bilaterally Respiratory: CTAB, no increased WOB Gastrointestinal: soft, diffusely tender to palpation L>R, obese abdomen MSK: healing surgical site at L foot 1st digit with black crusting, sutures still in  place, no erythema, mild edema, no warmth or drainage Derm: ecchymoses 1cm in size scattered across abdomen just below umbilicus Neuro: CN's grossly intact, no focal deficits Psych: Irritated mood (wishes to eat), full affect  Labs and Imaging: CBC BMET  Recent Labs  Lab 02/21/20 1655  WBC 10.3  HGB 4.0*  HCT 14.1*  PLT 370   Recent Labs  Lab 02/21/20 1655  NA 136  K 4.5  CL 95*  CO2 24  BUN 83*  CREATININE 7.67*  GLUCOSE 167*  CALCIUM 7.6*     EKG: Sinus rhythm LVH with secondary repolarization abnormality No STEMI.  DG Chest Portable 1 View  Result Date: 02/21/2020 CLINICAL DATA:  Feeling sluggish EXAM: PORTABLE CHEST 1 VIEW COMPARISON:  January 31, 2020 FINDINGS: The heart size and mediastinal contours are unchanged with mild cardiomegaly. There is prominence of the central pulmonary vasculature. Again noted mildly increased interstitial markings at both lung bases. A right-sided central venous catheter seen with the tip at the mid SVC. Overlying median sternotomy wires are present. No acute osseous abnormality. IMPRESSION: Mild cardiomegaly and pulmonary vascular congestion. Again noted are mildly increased interstitial markings of both lung bases which could be chronic lung changes versus interstitial edema. Electronically Signed   By: Prudencio Pair M.D.   On: 02/21/2020 18:35   Gladys Damme, MD 02/21/2020, 7:55 PM PGY-1, [place] [facility] pager: [phone], text pages welcome

## 2020-02-21 NOTE — Progress Notes (Signed)
New Admission Note:  Arrival Method: Via stretcher from ED to 20m04 Mental Orientation: Alert & Oriented x4 Telemetry: CCMD verified Assessment: Completed Skin: Refer to flowsheet IV: Right AC  Pain: 3/10 Safety Measures: Safety Fall Prevention Plan discussed with patient. Admission: Completed 5 [place] Orientation: Patient has been orientated to the room, unit and the staff.  Orders have been reviewed and are being implemented. Will continue to monitor the patient. Call light has been placed within reach and bed alarm has been activated.   Vassie Moselle, RN  Phone Number: [phone]

## 2020-02-21 NOTE — ED Triage Notes (Signed)
Pt arrived via [facility] from [facility]. Pt was at dialysis on Sat and her hemoglobin is 6.2, so the PA from there wanted her sent here. Pt states she feels "sluggish." Pt is A&Ox4. Vs stable.

## 2020-02-21 NOTE — ED Provider Notes (Signed)
[facility] EMERGENCY DEPARTMENT Provider Note   CSN: [id number] Arrival date & time: 02/21/20  1552     History Chief Complaint  Patient presents with  . hemoglobin 6.2    [name] [name] is a 69 y.o. female.  The history is provided by the patient and the nursing home. No language interpreter was used.     69 year old female with history of end-stage renal disease currently on Tuesday Thursday Saturday dialysis, CAD, CVA, recurrent falls, substance abuse, chronic microcytic anemia sent here via EMS from [facility] facility for anemia.  Patient had a hemoglobin of 6.2 according to the staff and need to be transfused.  Patient otherwise without any significant complaint.  She did report having her left great toe amputated approximately a week ago due to complication from diabetes.  She endorsed pain at the affected site but pain has been there since the surgery.  She does not complain of any fever lightheadedness or dizziness.  States overall she just does not feel well.  But denies any active chest pain or shortness of breath.  She denies any abnormal bleeding anywhere.  No melena.  No hematochezia.  She did not go to her dialysis session today due to not feeling well.  No cough, no COVID-19 symptoms.  Past Medical History:  Diagnosis Date  . Abscess   . Acute blood loss anemia 08/17/2019  . Acute respiratory failure ([place]) 10/18/2014  . Acute respiratory failure with hypoxia and hypercapnia ([place]) 06/01/2019  . Anemia 08/2016  . Angiodysplasia of colon   . Arthritis of left shoulder region 03/23/2013  . Bleeding gastrointestinal   . Cardiomegaly 05/2019  . Chest pain 04/17/2016  . Chronic combined systolic and diastolic CHF (congestive heart failure) (HCC)    a. EF 40-45%, mild LVH, mid apicalanteroseptal and apical HK.  . CKD (chronic kidney disease), stage III   . Cocaine abuse ([place])    crack cocaine heavily until 2008 then sporadic use since then  . Coronary  artery disease    a. 06/2012 NSTEMI/CABG x 3 (LIMA->LAD, VG->OM2, VG->LCX);  b. 04/2015 MV: EF<30%, mid ant, apicalanterior, apical infarct;  c. 04/2015 Cath: LM nl, LAD 90p, LCX 47m, OM1 min irregs, RCA mild dzs, LIMA->LAD nl w/ dist LAD dzs, VG->OM2 nl, VG->LCX nl-->Med Rx.  . CVA (cerebral infarction)    a. right internal capsule stroke in 12/2006  . Demand ischemia ([place])   . Diabetes mellitus    diagnosed in 2008  . Elevated troponin 04/27/2019  . Essential hypertension   . Glaucoma   . Gout   . Heme positive stool   . HFrEF (heart failure with reduced ejection fraction) ([place])   . Hyperlipidemia   . Hyperparathyroidism, secondary renal ([place])   . Hypertensive crisis 06/02/2019  . Left-sided sensory deficit present   . Lobar pneumonia ([place]) 04/27/2019  . Obesity, morbid ([place])   . Pneumonia   . Pulmonary edema 05/2019  . PVD (peripheral vascular disease) ([place])    a. 06/2012 ABI's: R - 0.73, L - 0.71.  Marland Kitchen Renal mass, right   . Sepsis ([place]) 04/27/2019  . Shortness of breath dyspnea   . Stroke ([place])   . Thrombocytosis ([place]) 04/17/2016  . Thyroid nodule    FNA in 0076 showed follicular cells but not definate neoplasm  . Tobacco abuse   . Trichomoniasis     Patient Active Problem List   Diagnosis Date Noted  . Chest pain 01/31/2020  . Acute encephalopathy  01/02/2020  . AMS (altered mental status) 01/01/2020  . Acute pulmonary edema (HCC)   . Recurrent falls 10/09/2019  . Generalized weakness 10/09/2019  . Unspecified atrial fibrillation ([place]) 10/08/2019  . Acute GI bleeding 10/05/2019  . Hyperkalemia   . Depression   . Esophageal stricture   . Diabetic gastroparesis ([place])   . Upper GI bleed 08/16/2019  . Elevated troponin 04/27/2019  . Hiatal hernia   . Iron deficiency anemia   . Microcytic anemia 02/05/2016  . Acute on chronic renal failure ([place])   . CAD in native artery   . Substance abuse ([place])   . Chronic combined systolic and diastolic CHF (congestive heart failure) ([place])    . ESRD (end stage renal disease) ([place])   . Obesity, Class III, BMI 40-49.9 (morbid obesity) ([place])   . Type 2 diabetes mellitus with chronic kidney disease on chronic dialysis, with long-term current use of insulin ([place]) 10/18/2014  . S/P CABG (coronary artery bypass graft) 09/21/2013  . Arthritis of right shoulder region 03/23/2013  . Tobacco abuse   . Coronary artery disease   . NSTEMI (non-ST elevated myocardial infarction) ([place]) 06/29/2012  . History of cocaine abuse ([place]) 06/29/2012  . Essential hypertension 06/29/2012  . Glaucoma   . Hyperlipidemia   . History of CVA (cerebrovascular accident)     Past Surgical History:  Procedure Laterality Date  . ABDOMINAL AORTOGRAM W/LOWER EXTREMITY Bilateral 02/06/2020   Procedure: ABDOMINAL AORTOGRAM W/LOWER EXTREMITY;  Surgeon: Waynetta Sandy, MD;  Location: [place] CV LAB;  Service: Cardiovascular;  Laterality: Bilateral;  . AMPUTATION Left 02/07/2020   Procedure: AMPUTATION DIGIT - LEFT GREAT TOE;  Surgeon: Waynetta Sandy, MD;  Location: [place];  Service: Vascular;  Laterality: Left;  . AV FISTULA PLACEMENT Left 08/25/2019   Procedure: ARTERIOVENOUS (AV) BRACHIOCEPHALIC FISTULA CREATION;  Surgeon: Waynetta Sandy, MD;  Location: [place];  Service: Vascular;  Laterality: Left;  . CARDIAC CATHETERIZATION    . CARDIAC CATHETERIZATION N/A 05/17/2015   Procedure: Left Heart Cath and Cors/Grafts Angiography;  Surgeon: [name], MD;  Location: [place] CV LAB;  Service: Cardiovascular;  Laterality: N/A;  . COLONOSCOPY WITH PROPOFOL N/A 04/21/2016   Procedure: COLONOSCOPY WITH PROPOFOL;  Surgeon: Irene Shipper, MD;  Location: [place];  Service: Endoscopy;  Laterality: N/A;  . CORONARY ARTERY BYPASS GRAFT  07/09/2012   Procedure: CORONARY ARTERY BYPASS GRAFTING (CABG);  Surgeon: Ivin Poot, MD;  Location: [place];  Service: Open Heart Surgery;  Laterality: N/A;  . ENTEROSCOPY N/A 10/07/2019   Procedure:  ENTEROSCOPY;  Surgeon: Jackquline Denmark, MD;  Location: [facility] ENDOSCOPY;  Service: Endoscopy;  Laterality: N/A;  . ESOPHAGOGASTRODUODENOSCOPY N/A 04/20/2016   Procedure: ESOPHAGOGASTRODUODENOSCOPY (EGD);  Surgeon: Gatha Mayer, MD;  Location: [facility] ENDOSCOPY;  Service: Endoscopy;  Laterality: N/A;  . ESOPHAGOGASTRODUODENOSCOPY (EGD) WITH PROPOFOL N/A 08/17/2019   Procedure: ESOPHAGOGASTRODUODENOSCOPY (EGD) WITH PROPOFOL;  Surgeon: Irene Shipper, MD;  Location: [facility] ENDOSCOPY;  Service: Endoscopy;  Laterality: N/A;  . INSERTION OF DIALYSIS CATHETER Right 08/25/2019   Procedure: INSERTION OF DIALYSIS CATHETER;  Surgeon: Waynetta Sandy, MD;  Location: [place];  Service: Vascular;  Laterality: Right;  . IR FLUORO GUIDE CV LINE RIGHT  08/19/2019  . IR US GUIDE VASC ACCESS RIGHT  08/19/2019  . LEFT HEART CATHETERIZATION WITH CORONARY ANGIOGRAM N/A 06/29/2012   Procedure: LEFT HEART CATHETERIZATION WITH CORONARY ANGIOGRAM;  Surgeon: Peter M Martinique, MD;  Location: [facility] CATH LAB;  Service: Cardiovascular;  Laterality:  N/A;  . PERIPHERAL VASCULAR ATHERECTOMY Left 02/06/2020   Procedure: PERIPHERAL VASCULAR ATHERECTOMY;  Surgeon: Waynetta Sandy, MD;  Location: [place] CV LAB;  Service: Cardiovascular;  Laterality: Left;  SFA  . PERIPHERAL VASCULAR INTERVENTION Left 02/06/2020   Procedure: PERIPHERAL VASCULAR INTERVENTION;  Surgeon: Waynetta Sandy, MD;  Location: [place] CV LAB;  Service: Cardiovascular;  Laterality: Left;  SFA  . REVISON OF ARTERIOVENOUS FISTULA Left 02/07/2020   Procedure: REVISON OF ARTERIOVENOUS FISTULA;  Surgeon: Waynetta Sandy, MD;  Location: [place];  Service: Vascular;  Laterality: Left;  . STERNAL WOUND DEBRIDEMENT  08/17/2012   Procedure: STERNAL WOUND DEBRIDEMENT;  Surgeon: Ivin Poot, MD;  Location: [place];  Service: Thoracic;  Laterality: N/A;  wound vac application  . STERNAL WOUND DEBRIDEMENT  08/24/2012   Procedure: STERNAL WOUND DEBRIDEMENT;   Surgeon: Ivin Poot, MD;  Location: [place];  Service: Thoracic;  Laterality: N/A;  . STERNAL WOUND DEBRIDEMENT  09/01/2012   Procedure: STERNAL WOUND DEBRIDEMENT;  Surgeon: Ivin Poot, MD;  Location: [place];  Service: Thoracic;  Laterality: N/A;  . STERNAL WOUND DEBRIDEMENT  09/20/2012   Procedure: STERNAL WOUND DEBRIDEMENT;  Surgeon: Ivin Poot, MD;  Location: [facility];  Service: Thoracic;  Laterality: N/A;  wound vac change  . SUBMUCOSAL TATTOO INJECTION  10/07/2019   Procedure: SUBMUCOSAL TATTOO INJECTION;  Surgeon: Jackquline Denmark, MD;  Location: [facility] ENDOSCOPY;  Service: Endoscopy;;     OB History   No obstetric history on file.     Family History  Problem Relation Age of Onset  . Diabetes Mother   . Hypertension Mother   . Cancer Mother   . Hyperlipidemia Father   . Hypertension Father   . Kidney disease Father   . Gout Father   . Cerebrovascular Accident Father   . Other Other        no known family CAD    Social History   Tobacco Use  . Smoking status: Current Some Day Smoker    Packs/day: 0.25    Years: 50.00    Pack years: 12.50    Types: Cigarettes  . Smokeless tobacco: Never Used  Substance Use Topics  . Alcohol use: No    Alcohol/week: 0.0 standard drinks  . Drug use: Yes    Types: Cocaine    Comment: pt denies at current time    Home Medications Prior to Admission medications   Medication Sig Start Date End Date Taking? Authorizing Provider  allopurinol (ZYLOPRIM) 100 MG tablet Take 100 mg by mouth 2 (two) times daily.  11/25/17   [provider]  aspirin EC 325 MG tablet Take 325 mg by mouth daily. 10/28/19   [provider]  brinzolamide (AZOPT) 1 % ophthalmic suspension Place 1 drop into both eyes 2 (two) times a day.     [provider]  buPROPion (WELLBUTRIN SR) 150 MG 12 hr tablet Take 1 tablet (150 mg total) by mouth 2 (two) times daily. 01/16/16   Mikhail, Velta Addison, DO  cetirizine (ZYRTEC) 10 MG tablet Take 10 mg by  mouth daily as needed for allergies.     [provider]  clopidogrel (PLAVIX) 75 MG tablet Take 1 tablet (75 mg total) by mouth daily with breakfast. 02/11/20   Aline August, MD  ferrous sulfate 325 (65 FE) MG tablet Take 325 mg by mouth 2 (two) times daily with a meal.     [provider]  gabapentin (NEURONTIN) 300 MG  capsule Take 1 capsule (300 mg total) by mouth at bedtime. 02/10/20   Aline August, MD  insulin aspart (NOVOLOG) 100 UNIT/ML injection Inject 2 Units into the skin 3 (three) times daily with meals. 02/10/20   Aline August, MD  ipratropium (ATROVENT HFA) 17 MCG/ACT inhaler Inhale 2 puffs into the lungs every 6 (six) hours as needed for wheezing.    [provider]  latanoprost (XALATAN) 0.005 % ophthalmic solution Place 1 drop into both eyes at bedtime.    [provider]  LINZESS 72 MCG capsule Take 72 mcg by mouth daily before breakfast.  11/21/17   [provider]  metoprolol tartrate (LOPRESSOR) 25 MG tablet Take 1 tablet (25 mg total) by mouth 2 (two) times daily. 02/10/20   Aline August, MD  mometasone-formoterol (DULERA) 100-5 MCG/ACT AERO Inhale 2 puffs into the lungs every 4 (four) hours as needed for wheezing.    [provider]  multivitamin (RENA-VIT) TABS tablet Take 1 tablet by mouth at bedtime. 02/10/20   Aline August, MD  nitroGLYCERIN (NITROSTAT) 0.4 MG SL tablet Place 1 tablet (0.4 mg total) under the tongue every 5 (five) minutes as needed for chest pain. 04/30/19   [name], MD  oxyCODONE (OXY IR/ROXICODONE) 5 MG immediate release tablet Take 1 tablet (5 mg total) by mouth every 6 (six) hours as needed for moderate pain. 02/10/20   Rhyne, Hulen Shouts, PA-C  pantoprazole (PROTONIX) 40 MG tablet Take 1 tablet (40 mg total) by mouth daily. 08/26/19   Jeanmarie Hubert, MD  polyethylene glycol (MIRALAX) 17 g packet Take 17 g by mouth daily as needed for moderate constipation. 02/10/20   Aline August, MD  pravastatin  (PRAVACHOL) 40 MG tablet Take 40 mg by mouth daily. 10/28/19   [provider]  PROAIR HFA 108 (90 Base) MCG/ACT inhaler Inhale 2 puffs into the lungs every 6 (six) hours as needed for wheezing.  11/17/17   [provider]  TUMS 500 MG chewable tablet Chew 2 tablets by mouth at bedtime. 01/19/20   [provider]  VELPHORO 500 MG chewable tablet Chew 2 tablets (1,000 mg total) by mouth 3 (three) times daily with meals. 02/10/20   Aline August, MD    Allergies    Naproxen  Review of Systems   Review of Systems  All other systems reviewed and are negative.   Physical Exam Updated Vital Signs BP (!) 97/47   Pulse 64   Temp (!) 97.4 F (36.3 C) (Oral)   Ht 5\' 8"  (1.727 m)   Wt 118.8 kg   SpO2 100%   BMI 39.84 kg/m   Physical Exam Vitals and nursing note reviewed.  Constitutional:      General: She is not in acute distress.    Appearance: She is well-developed. She is obese.  HENT:     Head: Atraumatic.  Eyes:     Conjunctiva/sclera: Conjunctivae normal.  Cardiovascular:     Rate and Rhythm: Normal rate and regular rhythm.     Pulses: Normal pulses.     Heart sounds: Normal heart sounds.  Pulmonary:     Effort: Pulmonary effort is normal.     Breath sounds: Normal breath sounds.  Abdominal:     Palpations: Abdomen is soft.     Tenderness: There is no abdominal tenderness.  Musculoskeletal:     Cervical back: Neck supple.     Comments: Left foot: Recent amputation of left great toe with sutures in place but no signs  of infection.  It is tender to palpation.  Skin:    Findings: No rash.  Neurological:     Mental Status: She is alert and oriented to person, place, and time.  Psychiatric:        Mood and Affect: Mood normal.     ED Results / Procedures / Treatments   Labs (all labs ordered are listed, but only abnormal results are displayed) Labs Reviewed  IRON AND TIBC - Abnormal; Notable for the following components:      Result Value     TIBC 242 (*)    Saturation Ratios 55 (*)    All other components within normal limits  RETICULOCYTES - Abnormal; Notable for the following components:   Retic Ct Pct 8.1 (*)    RBC. 1.44 (*)    Immature Retic Fract 39.1 (*)    All other components within normal limits  CBC WITH DIFFERENTIAL/PLATELET - Abnormal; Notable for the following components:   RBC 1.50 (*)    Hemoglobin 4.0 (*)    HCT 14.1 (*)    MCHC 28.4 (*)    RDW 23.4 (*)    nRBC 2.8 (*)    Neutro Abs 7.8 (*)    Abs Immature Granulocytes 0.09 (*)    All other components within normal limits  BASIC METABOLIC PANEL - Abnormal; Notable for the following components:   Chloride 95 (*)    Glucose, Bld 167 (*)    BUN 83 (*)    Creatinine, Ser 7.67 (*)    Calcium 7.6 (*)    GFR calc non Af Amer 5 (*)    GFR calc Af Amer 6 (*)    Anion gap 17 (*)    All other components within normal limits  POC OCCULT BLOOD, ED - Abnormal; Notable for the following components:   Fecal Occult Bld POSITIVE (*)    All other components within normal limits  SARS CORONAVIRUS 2 (TAT 6-24 HRS)  VITAMIN B12  FOLATE  FERRITIN  TYPE AND SCREEN  PREPARE RBC (CROSSMATCH)    EKG None  Radiology DG Chest Portable 1 View  Result Date: 02/21/2020 CLINICAL DATA:  Feeling sluggish EXAM: PORTABLE CHEST 1 VIEW COMPARISON:  January 31, 2020 FINDINGS: The heart size and mediastinal contours are unchanged with mild cardiomegaly. There is prominence of the central pulmonary vasculature. Again noted mildly increased interstitial markings at both lung bases. A right-sided central venous catheter seen with the tip at the mid SVC. Overlying median sternotomy wires are present. No acute osseous abnormality. IMPRESSION: Mild cardiomegaly and pulmonary vascular congestion. Again noted are mildly increased interstitial markings of both lung bases which could be chronic lung changes versus interstitial edema. Electronically Signed   By: Prudencio Pair M.D.   On:  02/21/2020 18:35    Procedures .Critical Care Performed by: Domenic Moras, PA-C Authorized by: Domenic Moras, PA-C   Critical care provider statement:    Critical care time (minutes):  37   Critical care was time spent personally by me on the following activities:  Discussions with consultants, evaluation of patient's response to treatment, examination of patient, ordering and performing treatments and interventions, ordering and review of laboratory studies, ordering and review of radiographic studies, pulse oximetry, re-evaluation of patient's condition, obtaining history from patient or surrogate and review of old charts   (including critical care time)  Medications Ordered in ED Medications  0.9 %  sodium chloride infusion (has no administration in time range)    ED Course  I have reviewed the triage vital signs and the nursing notes.  Pertinent labs & imaging results that were available during my care of the patient were reviewed by me and considered in my medical decision making (see chart for details).    MDM Rules/Calculators/A&P                      BP 93/77   Pulse 67   Temp 97.6 F (36.4 C) (Oral)   Resp 16   Ht 5\' 8"  (1.727 m)   Wt 118.8 kg   SpO2 92%   BMI 39.84 kg/m   Final Clinical Impression(s) / ED Diagnoses Final diagnoses:  Symptomatic anemia  Gastrointestinal hemorrhage, unspecified gastrointestinal hemorrhage type  Dialysis patient ([place])    Rx / DC Orders ED Discharge Orders    None     5:03 PM Sent here due to having hemoglobin of 6.2 as well as not feeling well.  She does have history of end-stage renal disease and history of chronic microcytic anemia and is a dialysis patient.  At this time her current blood pressure is 99/53 however she is not tachycardic.  She is not hypoxic.  She would likely benefit from blood transfusion if her hemoglobin is 6.2.  Will recheck labs.  Plan to also reach out to dialysis specialist as patient missed dialysis  today.  She does not complain of any chest pain shortness of breath and does not appear to be fluid overload.  5:43 PM Hemoglobin is 4.0 patient denies any abnormal bleeding.  She does take aspirin on a daily basis and while performing rectal exam, she does have dark color stool, fecal occult blood test is currently pending.  5:56 PM Fecal occult blood test is positive.  It appears that GI specialist, Dr. Carlean Purl, has care for this patient in the past.  Will consult GI specialist for further evaluation of her anemia and likely secondary to GI bleed.  Current blood pressure is 77/26.  Will recheck.  7:18 PM Blood pressure is 93/77.  Patient resting comfortably.  She is currently receiving blood product.  I have consulted GI specialist, Dr. Hilarie Fredrickson, who will be involved in patient care.  Will consult medicine for admission.  7:49 PM Appreciate consultation from internal medicine resident who agrees to see and admit patient for further care.  Covid-19 test ordered.    [name] [name] was evaluated in Emergency Department on 02/21/2020 for the symptoms described in the history of present illness. She was evaluated in the context of the global COVID-19 pandemic, which necessitated consideration that the patient might be at risk for infection with the SARS-CoV-2 virus that causes COVID-19. Institutional protocols and algorithms that pertain to the evaluation of patients at risk for COVID-19 are in a state of rapid change based on information released by regulatory bodies including the CDC and federal and state organizations. These policies and algorithms were followed during the patient's care in the ED.    Domenic Moras, PA-C 02/21/20 1952    Quintella Reichert, MD 02/21/20 2242

## 2020-02-22 ENCOUNTER — Encounter (HOSPITAL_COMMUNITY): Payer: Self-pay | Admitting: Family Medicine

## 2020-02-22 DIAGNOSIS — K5520 Angiodysplasia of colon without hemorrhage: Secondary | ICD-10-CM

## 2020-02-22 DIAGNOSIS — Z794 Long term (current) use of insulin: Secondary | ICD-10-CM

## 2020-02-22 DIAGNOSIS — K219 Gastro-esophageal reflux disease without esophagitis: Secondary | ICD-10-CM

## 2020-02-22 DIAGNOSIS — K449 Diaphragmatic hernia without obstruction or gangrene: Secondary | ICD-10-CM

## 2020-02-22 DIAGNOSIS — N186 End stage renal disease: Secondary | ICD-10-CM

## 2020-02-22 DIAGNOSIS — K922 Gastrointestinal hemorrhage, unspecified: Secondary | ICD-10-CM

## 2020-02-22 DIAGNOSIS — E1122 Type 2 diabetes mellitus with diabetic chronic kidney disease: Secondary | ICD-10-CM

## 2020-02-22 DIAGNOSIS — D649 Anemia, unspecified: Secondary | ICD-10-CM

## 2020-02-22 DIAGNOSIS — Z992 Dependence on renal dialysis: Secondary | ICD-10-CM

## 2020-02-22 LAB — CBC
HCT: 19.6 % — ABNORMAL LOW (ref 36.0–46.0)
Hemoglobin: 6.1 g/dL — CL (ref 12.0–15.0)
MCH: 28.4 pg (ref 26.0–34.0)
MCHC: 31.1 g/dL (ref 30.0–36.0)
MCV: 91.2 fL (ref 80.0–100.0)
Platelets: 312 10*3/uL (ref 150–400)
RBC: 2.15 MIL/uL — ABNORMAL LOW (ref 3.87–5.11)
RDW: 19.2 % — ABNORMAL HIGH (ref 11.5–15.5)
WBC: 10.4 10*3/uL (ref 4.0–10.5)
nRBC: 1.9 % — ABNORMAL HIGH (ref 0.0–0.2)

## 2020-02-22 LAB — GLUCOSE, CAPILLARY
Glucose-Capillary: 78 mg/dL (ref 70–99)
Glucose-Capillary: 78 mg/dL (ref 70–99)
Glucose-Capillary: 137 mg/dL — ABNORMAL HIGH (ref 70–99)
Glucose-Capillary: 265 mg/dL — ABNORMAL HIGH (ref 70–99)
Glucose-Capillary: 255 mg/dL — ABNORMAL HIGH (ref 70–99)

## 2020-02-22 LAB — BASIC METABOLIC PANEL
Anion gap: 15 (ref 5–15)
BUN: 84 mg/dL — ABNORMAL HIGH (ref 8–23)
CO2: 26 mmol/L (ref 22–32)
Calcium: 7.5 mg/dL — ABNORMAL LOW (ref 8.9–10.3)
Chloride: 98 mmol/L (ref 98–111)
Creatinine, Ser: 7.85 mg/dL — ABNORMAL HIGH (ref 0.44–1.00)
GFR calc Af Amer: 6 mL/min — ABNORMAL LOW (ref >60)
GFR calc non Af Amer: 5 mL/min — ABNORMAL LOW (ref >60)
Glucose, Bld: 119 mg/dL — ABNORMAL HIGH (ref 70–99)
Potassium: 4.1 mmol/L (ref 3.5–5.1)
Sodium: 139 mmol/L (ref 135–145)

## 2020-02-22 LAB — HEMOGLOBIN AND HEMATOCRIT, BLOOD
HCT: 24.6 % — ABNORMAL LOW (ref 36.0–46.0)
Hemoglobin: 7.7 g/dL — ABNORMAL LOW (ref 12.0–15.0)

## 2020-02-22 LAB — PREPARE RBC (CROSSMATCH)

## 2020-02-22 MED ORDER — INSULIN ASPART 100 UNIT/ML ~~LOC~~ SOLN
0.0000 [IU] | Freq: Every day | SUBCUTANEOUS | Status: DC
  Administered 2020-02-22: 3 [IU] via SUBCUTANEOUS

## 2020-02-22 MED ORDER — HEPARIN SODIUM (PORCINE) 1000 UNIT/ML IJ SOLN
INTRAMUSCULAR | Status: AC
  Administered 2020-02-22: 4000 [IU]
  Filled 2020-02-22: qty 4

## 2020-02-22 MED ORDER — SODIUM CHLORIDE 0.9 % IV SOLN: 100.0000 mL | INTRAVENOUS | Status: DC | PRN

## 2020-02-22 MED ORDER — LIDOCAINE HCL (PF) 1 % IJ SOLN: 5.0000 mL | INTRAMUSCULAR | Status: DC | PRN

## 2020-02-22 MED ORDER — INSULIN ASPART 100 UNIT/ML ~~LOC~~ SOLN
0.0000 [IU] | Freq: Three times a day (TID) | SUBCUTANEOUS | Status: DC
  Administered 2020-02-23: 2 [IU] via SUBCUTANEOUS
  Administered 2020-02-25 – 2020-02-27 (×3): 1 [IU] via SUBCUTANEOUS
  Administered 2020-02-28: 2 [IU] via SUBCUTANEOUS
  Administered 2020-02-28: 1 [IU] via SUBCUTANEOUS

## 2020-02-22 MED ORDER — ALTEPLASE 2 MG IJ SOLR: 2.0000 mg | Freq: Once | INTRAMUSCULAR | Status: DC | PRN

## 2020-02-22 MED ORDER — ALLOPURINOL 100 MG PO TABS
100.0000 mg | ORAL_TABLET | Freq: Every day | ORAL | Status: DC
  Administered 2020-02-23 – 2020-02-28 (×5): 100 mg via ORAL
  Filled 2020-02-22 (×5): qty 1

## 2020-02-22 MED ORDER — BISACODYL 5 MG PO TBEC
20.0000 mg | DELAYED_RELEASE_TABLET | Freq: Once | ORAL | Status: AC
  Administered 2020-02-23: 20 mg via ORAL
  Filled 2020-02-22: qty 4

## 2020-02-22 MED ORDER — SODIUM CHLORIDE 0.9% IV SOLUTION: Freq: Once | INTRAVENOUS | Status: DC

## 2020-02-22 MED ORDER — LINACLOTIDE 72 MCG PO CAPS
72.0000 ug | ORAL_CAPSULE | Freq: Every day | ORAL | Status: DC
  Administered 2020-02-22 – 2020-02-27 (×5): 72 ug via ORAL
  Filled 2020-02-22 (×8): qty 1

## 2020-02-22 MED ORDER — ATORVASTATIN CALCIUM 40 MG PO TABS
40.0000 mg | ORAL_TABLET | Freq: Every day | ORAL | Status: DC
  Administered 2020-02-22 – 2020-02-28 (×6): 40 mg via ORAL
  Filled 2020-02-22 (×6): qty 1

## 2020-02-22 MED ORDER — PANTOPRAZOLE SODIUM 40 MG PO TBEC
40.0000 mg | DELAYED_RELEASE_TABLET | Freq: Every day | ORAL | Status: DC
  Administered 2020-02-23 – 2020-02-28 (×5): 40 mg via ORAL
  Filled 2020-02-22 (×5): qty 1

## 2020-02-22 MED ORDER — PENTAFLUOROPROP-TETRAFLUOROETH EX AERO: 1.0000 "application " | INHALATION_SPRAY | CUTANEOUS | Status: DC | PRN

## 2020-02-22 MED ORDER — POLYETHYLENE GLYCOL 3350 17 G PO PACK
17.0000 g | PACK | Freq: Every day | ORAL | Status: DC
  Administered 2020-02-22 – 2020-02-23 (×2): 17 g via ORAL
  Filled 2020-02-22 (×5): qty 1

## 2020-02-22 MED ORDER — CHLORHEXIDINE GLUCONATE CLOTH 2 % EX PADS
6.0000 | MEDICATED_PAD | Freq: Every day | CUTANEOUS | Status: DC
  Administered 2020-02-22 – 2020-02-25 (×3): 6 via TOPICAL

## 2020-02-22 MED ORDER — OXYCODONE HCL 5 MG PO TABS
5.0000 mg | ORAL_TABLET | Freq: Four times a day (QID) | ORAL | Status: DC | PRN
  Administered 2020-02-22 – 2020-02-28 (×4): 5 mg via ORAL
  Filled 2020-02-22 (×4): qty 1

## 2020-02-22 MED ORDER — LIDOCAINE-PRILOCAINE 2.5-2.5 % EX CREA: 1.0000 "application " | TOPICAL_CREAM | CUTANEOUS | Status: DC | PRN

## 2020-02-22 NOTE — Progress Notes (Signed)
No Heparin HD treatment with administration of 2 units of PRBC, System changed after completion of unit 2. All blood returned.  TMP and VP alarms rinsed arterial line back, unable to return all of venous. Patient refusing to initiate HD again. Katie  PA notified of above. AMA obtained with 55 minutes remaining, Net UF 1335

## 2020-02-22 NOTE — Consult Note (Signed)
[name]: 10:37 AM 02/22/2020  LOS: 1 day    Referring Provider: Dr. Wendy Poet Primary Care Physician:  [place] Primary Gastroenterologist:  Dr Carlean Purl    Reason for Consultation: FOBT positive anemia.   HPI: [name] [name] is a 69 y.o. female.  PMH ESRD on HD TTS.  IDDM.  Morbid obesity.  CVA.  CAD.  CABG 2013.  CHF, EF 45-50%.  Peripheral neuropathy.  Cocaine abuse as recently as 07/2019.  Infrarenal abdominal aneurysm 3.1 cm on CT of 07/2019.  A. fib, not on anticoagulation due to history of GI bleed and frequent falls. GI bleeding, blood loss anemia presumed due to colonic and SB AVMs as recently as 07/2019 and 09/2019 03/2016 EGD: For evaluation of IDA, FOBT positive.  Medium sized hiatal hernia, no Cameron's lesions.  Otherwise normal study. 03/2016 colonoscopy: solitary, nonbleeding colonic AVM at mid ascending colon, not treated. 07/2019 EGD for anemia, FOBT positive.  Mild esophagitis, esophageal stricture.  HH, no Cameron erosions.  Retained gastric contents c/w gastroparesis.  No source for blood loss isolated. 10/07/2019 SB enteroscopy: Nonbleeding [name] erosion within moderate hiatal hernia.  Study performed to the jejunum and otherwise normal.  Tattoo placed at distal extent of exam.  Admission 2/2 -02/10/2020 for gangrenous left hallux ulcer.  Rx with antibiotics.  02/06/2020 left lower extremity arteriogram/angiography and stenting left posterior popliteal artery.  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   Transfusion requiring anemia, Hgb nadir 6.7 >> 2 PRBC >> 8.1 on dc 02/10/20.  Acute, toxic encephalopathy attributed to increased dose of gabapentin. FOBT +.  Discharged on low-dose aspirin, Plavix, daily pantoprazole, bid iron sulfate. Home meds also include Linzess and  prn MiraLAX.  Transported from SNF for evaluation of Hgb 6.2 at dialysis on 2/21.  Is having some postoperative pain at the site of the left toe amputation.  Endorsed malaise.  Denied dizziness, chest pain, dyspnea, abdominal pain, melena, hematochezia.  Stools are generally dark she is used to being on the iron, occur about every day Hgb 4 >> 6.1.  Was 8.1 - 9.5 two to three weeks ago.  MCV 91.   2 additional PRBCs ordered.   Normal platelets and WBCs. FOBT +, rectal exam has been performed.   Past Medical History:  Diagnosis Date  . Abscess   . Acute blood loss anemia 08/17/2019  . Acute pulmonary edema (HCC)   . Acute respiratory failure ([place]) 10/18/2014  . Acute respiratory failure with hypoxia and hypercapnia ([place]) 06/01/2019  . AMS (altered mental status) 01/01/2020  . Anemia 08/2016  . Angiodysplasia of colon   . Arthritis of left shoulder region 03/23/2013  . Bleeding gastrointestinal   . Cardiomegaly 05/2019  . Chest pain 04/17/2016  . Chronic combined systolic and diastolic CHF (congestive heart failure) (HCC)    a. EF 40-45%, mild LVH, mid apicalanteroseptal and apical HK.  . CKD (chronic kidney disease), stage III   . Cocaine abuse ([place])    crack cocaine heavily until 2008 then sporadic use since then  . Coronary artery  disease    a. 06/2012 NSTEMI/CABG x 3 (LIMA->LAD, VG->OM2, VG->LCX);  b. 04/2015 MV: EF<30%, mid ant, apicalanterior, apical infarct;  c. 04/2015 Cath: LM nl, LAD 90p, LCX 31m, OM1 min irregs, RCA mild dzs, LIMA->LAD nl w/ dist LAD dzs, VG->OM2 nl, VG->LCX nl-->Med Rx.  . CVA (cerebral infarction)    a. right internal capsule stroke in 12/2006  . Demand ischemia ([place])   . Diabetes mellitus    diagnosed in 2008  . Elevated troponin 04/27/2019  . Essential hypertension   . Glaucoma   . Gout   . Heme positive stool   . HFrEF (heart failure with reduced ejection fraction) ([place])   . Hyperlipidemia   . Hyperparathyroidism, secondary renal ([place])   . Hypertensive  crisis 06/02/2019  . Left-sided sensory deficit present   . Lobar pneumonia ([place]) 04/27/2019  . Obesity, morbid ([place])   . Pneumonia   . Pulmonary edema 05/2019  . PVD (peripheral vascular disease) ([place])    a. 06/2012 ABI's: R - 0.73, L - 0.71.  Marland Kitchen Renal mass, right   . Sepsis ([place]) 04/27/2019  . Shortness of breath dyspnea   . Stroke ([place])   . Thrombocytosis ([place]) 04/17/2016  . Thyroid nodule    FNA in 7001 showed follicular cells but not definate neoplasm  . Tobacco abuse   . Trichomoniasis     Past Surgical History:  Procedure Laterality Date  . ABDOMINAL AORTOGRAM W/LOWER EXTREMITY Bilateral 02/06/2020   Procedure: ABDOMINAL AORTOGRAM W/LOWER EXTREMITY;  Surgeon: Waynetta Sandy, MD;  Location: [facility];  Service: Cardiovascular;  Laterality: Bilateral;  . AMPUTATION Left 02/07/2020   Procedure: AMPUTATION DIGIT - LEFT GREAT TOE;  Surgeon: Waynetta Sandy, MD;  Location: [facility];  Service: Vascular;  Laterality: Left;  . AV FISTULA PLACEMENT Left 08/25/2019   Procedure: ARTERIOVENOUS (AV) BRACHIOCEPHALIC FISTULA CREATION;  Surgeon: Waynetta Sandy, MD;  Location: [facility];  Service: Vascular;  Laterality: Left;  . CARDIAC CATHETERIZATION    . CARDIAC CATHETERIZATION N/A 05/17/2015   Procedure: Left Heart Cath and Cors/Grafts Angiography;  Surgeon: [name], MD;  Location: [facility];  Service: Cardiovascular;  Laterality: N/A;  . COLONOSCOPY WITH PROPOFOL N/A 04/21/2016   Procedure: COLONOSCOPY WITH PROPOFOL;  Surgeon: Irene Shipper, MD;  Location: [facility];  Service: Endoscopy;  Laterality: N/A;  . CORONARY ARTERY BYPASS GRAFT  07/09/2012   Procedure: CORONARY ARTERY BYPASS GRAFTING (CABG);  Surgeon: Ivin Poot, MD;  Location: [place] [facility];  Service: Open Heart Surgery;  Laterality: N/A;  . ENTEROSCOPY N/A 10/07/2019   Procedure: ENTEROSCOPY;  Surgeon: Jackquline Denmark, MD;  Location: [facility] ENDOSCOPY;  Service: Endoscopy;  Laterality: N/A;  .  ESOPHAGOGASTRODUODENOSCOPY N/A 04/20/2016   Procedure: ESOPHAGOGASTRODUODENOSCOPY (EGD);  Surgeon: Gatha Mayer, MD;  Location: [facility] ENDOSCOPY;  Service: Endoscopy;  Laterality: N/A;  . ESOPHAGOGASTRODUODENOSCOPY (EGD) WITH PROPOFOL N/A 08/17/2019   Procedure: ESOPHAGOGASTRODUODENOSCOPY (EGD) WITH PROPOFOL;  Surgeon: Irene Shipper, MD;  Location: [facility] ENDOSCOPY;  Service: Endoscopy;  Laterality: N/A;  . INSERTION OF DIALYSIS CATHETER Right 08/25/2019   Procedure: INSERTION OF DIALYSIS CATHETER;  Surgeon: Waynetta Sandy, MD;  Location: [facility];  Service: Vascular;  Laterality: Right;  . IR FLUORO GUIDE CV LINE RIGHT  08/19/2019  . IR US GUIDE VASC ACCESS RIGHT  08/19/2019  . LEFT HEART CATHETERIZATION WITH CORONARY ANGIOGRAM N/A 06/29/2012   Procedure: LEFT HEART CATHETERIZATION WITH CORONARY ANGIOGRAM;  Surgeon: Peter M Martinique, MD;  Location: [facility] CATH  LAB;  Service: Cardiovascular;  Laterality: N/A;  . PERIPHERAL VASCULAR ATHERECTOMY Left 02/06/2020   Procedure: PERIPHERAL VASCULAR ATHERECTOMY;  Surgeon: Waynetta Sandy, MD;  Location: [facility];  Service: Cardiovascular;  Laterality: Left;  SFA  . PERIPHERAL VASCULAR INTERVENTION Left 02/06/2020   Procedure: PERIPHERAL VASCULAR INTERVENTION;  Surgeon: Waynetta Sandy, MD;  Location: [facility];  Service: Cardiovascular;  Laterality: Left;  SFA  . REVISON OF ARTERIOVENOUS FISTULA Left 02/07/2020   Procedure: REVISON OF ARTERIOVENOUS FISTULA;  Surgeon: Waynetta Sandy, MD;  Location: [facility];  Service: Vascular;  Laterality: Left;  . STERNAL WOUND DEBRIDEMENT  08/17/2012   Procedure: STERNAL WOUND DEBRIDEMENT;  Surgeon: Ivin Poot, MD;  Location: [facility];  Service: Thoracic;  Laterality: N/A;  wound vac application  . STERNAL WOUND DEBRIDEMENT  08/24/2012   Procedure: STERNAL WOUND DEBRIDEMENT;  Surgeon: Ivin Poot, MD;  Location: [facility];  Service: Thoracic;  Laterality: N/A;  . STERNAL WOUND DEBRIDEMENT   09/01/2012   Procedure: STERNAL WOUND DEBRIDEMENT;  Surgeon: Ivin Poot, MD;  Location: [facility];  Service: Thoracic;  Laterality: N/A;  . STERNAL WOUND DEBRIDEMENT  09/20/2012   Procedure: STERNAL WOUND DEBRIDEMENT;  Surgeon: Ivin Poot, MD;  Location: [facility] OR;  Service: Thoracic;  Laterality: N/A;  wound vac change  . SUBMUCOSAL TATTOO INJECTION  10/07/2019   Procedure: SUBMUCOSAL TATTOO INJECTION;  Surgeon: Jackquline Denmark, MD;  Location: [facility] ENDOSCOPY;  Service: Endoscopy;;    Prior to Admission medications   Medication Sig Start Date End Date Taking? Authorizing Provider  allopurinol (ZYLOPRIM) 100 MG tablet Take 100 mg by mouth 2 (two) times daily.  11/25/17  Yes [provider]  aspirin EC 325 MG tablet Take 325 mg by mouth daily. 10/28/19  Yes [provider]  buPROPion (WELLBUTRIN SR) 150 MG 12 hr tablet Take 1 tablet (150 mg total) by mouth 2 (two) times daily. 01/16/16  Yes Mikhail, Velta Addison, DO  cetirizine (ZYRTEC) 10 MG tablet Take 10 mg by mouth daily as needed for allergies.    Yes [provider]  clopidogrel (PLAVIX) 75 MG tablet Take 1 tablet (75 mg total) by mouth daily with breakfast. 02/11/20  Yes Aline August, MD  ferrous sulfate 325 (65 FE) MG tablet Take 325 mg by mouth 2 (two) times daily with a meal.    Yes [provider]  gabapentin (NEURONTIN) 300 MG capsule Take 1 capsule (300 mg total) by mouth at bedtime. 02/10/20  Yes Aline August, MD  insulin aspart (NOVOLOG) 100 UNIT/ML injection Inject 2 Units into the skin 3 (three) times daily with meals. 02/10/20  Yes Aline August, MD  insulin lispro (HUMALOG) 100 UNIT/ML injection Inject 2 Units into the skin 3 (three) times daily before meals.   Yes [provider]  latanoprost (XALATAN) 0.005 % ophthalmic solution Place 1 drop into both eyes at bedtime.   Yes [provider]  LINZESS 72 MCG capsule Take 72 mcg by mouth daily before breakfast.  11/21/17  Yes [provider]  metoprolol tartrate (LOPRESSOR) 25 MG tablet Take 1 tablet (25 mg total) by mouth 2 (two) times daily. 02/10/20  Yes Aline August, MD  mometasone-formoterol (DULERA) 100-5 MCG/ACT AERO Inhale 2 puffs into the lungs every 4 (four) hours as needed for wheezing.   Yes [provider]  multivitamin (RENA-VIT) TABS tablet Take 1 tablet by mouth at bedtime. 02/10/20  Yes Aline August, MD  nitroGLYCERIN (NITROSTAT) 0.4 MG  SL tablet Place 1 tablet (0.4 mg total) under the tongue every 5 (five) minutes as needed for chest pain. 04/30/19  Yes [name], MD  oxyCODONE (OXY IR/ROXICODONE) 5 MG immediate release tablet Take 1 tablet (5 mg total) by mouth every 6 (six) hours as needed for moderate pain. 02/10/20  Yes Rhyne, Samantha J, PA-C  polyethylene glycol (MIRALAX) 17 g packet Take 17 g by mouth daily as needed for moderate constipation. 02/10/20  Yes Aline August, MD  pravastatin (PRAVACHOL) 40 MG tablet Take 40 mg by mouth daily. 10/28/19  Yes [provider]  PROAIR HFA 108 (90 Base) MCG/ACT inhaler Inhale 2 puffs into the lungs every 6 (six) hours as needed for wheezing.  11/17/17  Yes [provider]  TUMS 500 MG chewable tablet Chew 2 tablets by mouth at bedtime. 01/19/20  Yes [provider]  VELPHORO 500 MG chewable tablet Chew 2 tablets (1,000 mg total) by mouth 3 (three) times daily with meals. 02/10/20  Yes Aline August, MD  pantoprazole (PROTONIX) 40 MG tablet Take 1 tablet (40 mg total) by mouth daily. Patient not taking: Reported on 02/21/2020 08/26/19   Jeanmarie Hubert, MD    Scheduled Meds: . sodium chloride   Intravenous Once  . allopurinol  100 mg Oral BID  . buPROPion  150 mg Oral BID  . Chlorhexidine Gluconate Cloth  6 each Topical Daily  . ferrous sulfate  325 mg Oral BID WC  . gabapentin  300 mg Oral QHS  . insulin aspart  0-6 Units Subcutaneous TID WC  . latanoprost  1 drop Both Eyes QHS  . multivitamin  1 tablet Oral QHS  .  pantoprazole (PROTONIX) IV  40 mg Intravenous Q12H  . pravastatin  40 mg Oral q1800  . sucroferric oxyhydroxide  1,000 mg Oral TID WC   Infusions: . sodium chloride     PRN Meds: acetaminophen **OR** acetaminophen, albuterol, mometasone-formoterol, nitroGLYCERIN, oxyCODONE   Allergies as of 02/21/2020 - Review Complete 02/21/2020  Allergen Reaction Noted  . Naproxen Rash 09/21/2013    Family History  Problem Relation Age of Onset  . Diabetes Mother   . Hypertension Mother   . Cancer Mother   . Hyperlipidemia Father   . Hypertension Father   . Kidney disease Father   . Gout Father   . Cerebrovascular Accident Father   . Other Other        no known family CAD    Social History   Socioeconomic History  . Marital status: Single    Spouse name: Not on file  . Number of children: 0  . Years of education: Not on file  . Highest education level: Not on file  Occupational History  . Not on file  Tobacco Use  . Smoking status: Current Some Day Smoker    Packs/day: 0.25    Years: 50.00    Pack years: 12.50    Types: Cigarettes  . Smokeless tobacco: Never Used  Substance and Sexual Activity  . Alcohol use: No    Alcohol/week: 0.0 standard drinks  . Drug use: Yes    Types: Cocaine    Comment: pt denies at current time  . Sexual activity: Not Currently  Other Topics Concern  . Not on file  Social History Narrative   Lives with boyfriend of 20 years.    Social Determinants of Health   Financial Resource Strain:   . Difficulty of Paying Living Expenses: Not on file  Food Insecurity:   .  Worried About Charity fundraiser in the Last Year: Not on file  . Ran Out of Food in the Last Year: Not on file  Transportation Needs:   . Lack of Transportation (Medical): Not on file  . Lack of Transportation (Non-Medical): Not on file  Physical Activity:   . Days of Exercise per Week: Not on file  . Minutes of Exercise per Session: Not on file  Stress:   . Feeling of Stress  : Not on file  Social Connections:   . Frequency of Communication with Friends and Family: Not on file  . Frequency of Social Gatherings with Friends and Family: Not on file  . Attends Religious Services: Not on file  . Active Member of Clubs or Organizations: Not on file  . Attends Archivist Meetings: Not on file  . Marital Status: Not on file  Intimate Partner Violence:   . Fear of Current or Ex-Partner: Not on file  . Emotionally Abused: Not on file  . Physically Abused: Not on file  . Sexually Abused: Not on file    REVIEW OF SYSTEMS: Constitutional: Malaise, no profound fatigue. ENT:  No nose bleeds Pulm: No shortness of breath and cough. CV:  No palpitations, no LE edema.  Angina GU:  No hematuria, no frequency GI: No dysphagia.  No nausea, vomiting, no anorexia. Heme: No excessive or unusual bleeding or bruising. Transfusions: See HPI. Neuro:  No headaches,.  Some pain at the amputation site in her left foot.  No syncope.  No seizures. Derm:  No itching, no rash or sores.  Endocrine:  No sweats or chills.  No polyuria or dysuria Immunization: Reviewed.  Received flu shot in 12/2019.  Has not received any Covid vaccination yet. Travel:  None beyond local counties in last few months.    PHYSICAL EXAM: Vital signs in last 24 hours: Vitals:   02/22/20 0800 02/22/20 0900  BP:  (!) 113/54  Pulse: 71   Resp: 18   Temp: 98.7 F (37.1 C)   SpO2: 100%    Wt Readings from Last 3 Encounters:  02/21/20 118.8 kg  02/10/20 120 kg  01/20/20 121.6 kg    General: Obese, chronically ill-appearing, alert, comfortable Head: No facial asymmetry or swelling.  No signs of head trauma. Eyes: Conjunctiva pale.  No scleral icterus.  EOMI. Ears: Not hard of hearing Nose: Discharge or congestion. Mouth: Oral mucosa pink, moist, clear.  Edentulous.  Tongue midline. Neck: Obese.  No JVD, no masses, no thyromegaly Lungs: Diminished breath sounds throughout both lung fields.   No labored breathing or cough. Heart: RRR.  No MRG.  S1, S2 present. Abdomen: Large, soft, nontender.  Active bowel sounds.  No HSM, masses, bruits, hernias..   Rectal: Deferred DRE. Musc/Skeltl: No joint redness, swelling or gross deformity other than the amputation of the left hallux. Extremities: Pitting edema on the left foot.  Dried blood intact suture line at the left MP joint Neurologic: Oriented x3.  Appropriate.  Good historian.  Moves all 4 limbs.  Strength not tested.  No tremors or gross deficits. Skin: No rashes Tattoos: None observed Nodes: No cervical adenopathy. Psych: Pleasant, calm, cooperative.  Intake/Output from previous day: 02/23 0701 - 02/24 0700 In: 720 [Blood:720] Out: 0  Intake/Output this shift: Total I/O In: 240 [P.O.:240] Out: 0   LAB RESULTS: Recent Labs    02/21/20 1655 02/22/20 0212  WBC 10.3 10.4  HGB 4.0* 6.1*  HCT 14.1* 19.6*  PLT 370  [place] Results  Component Value Date   NA 139 02/22/2020   NA 136 02/21/2020   NA 137 02/10/2020   K 4.1 02/22/2020   K 4.5 02/21/2020   K 4.6 02/10/2020   CL 98 02/22/2020   CL 95 (L) 02/21/2020   CL 98 02/10/2020   CO2 26 02/22/2020   CO2 24 02/21/2020   CO2 25 02/10/2020   GLUCOSE 119 (H) 02/22/2020   GLUCOSE 167 (H) 02/21/2020   GLUCOSE 164 (H) 02/10/2020   BUN 84 (H) 02/22/2020   BUN 83 (H) 02/21/2020   BUN 42 (H) 02/10/2020   CREATININE 7.85 (H) 02/22/2020   CREATININE 7.67 (H) 02/21/2020   CREATININE 5.89 (H) 02/10/2020   CALCIUM 7.5 (L) 02/22/2020   CALCIUM 7.6 (L) 02/21/2020   CALCIUM 8.0 (L) 02/10/2020   LFT No results for input(s): PROT, ALBUMIN, AST, ALT, ALKPHOS, BILITOT, BILIDIR, IBILI in the last 72 hours. PT/INR Lab Results  Component Value Date   INR 1.2 08/25/2019   INR 1.2 04/28/2019   INR 1.17 09/23/2016   Hepatitis Panel No results for input(s): HEPBSAG, HCVAB, HEPAIGM, HEPBIGM in the last 72 hours. C-Diff No components found for: CDIFF Lipase       Component Value Date/Time   LIPASE 26 01/31/2020 1056    Drugs of Abuse     Component Value Date/Time   LABOPIA NONE DETECTED 08/16/2019 2159   COCAINSCRNUR POSITIVE (A) 08/16/2019 2159   LABBENZ NONE DETECTED 08/16/2019 2159   AMPHETMU NONE DETECTED 08/16/2019 2159   THCU NONE DETECTED 08/16/2019 2159   LABBARB NONE DETECTED 08/16/2019 2159     RADIOLOGY STUDIES: DG Chest Portable 1 View  Result Date: 02/21/2020 CLINICAL DATA:  Feeling sluggish EXAM: PORTABLE CHEST 1 VIEW COMPARISON:  January 31, 2020 FINDINGS: The heart size and mediastinal contours are unchanged with mild cardiomegaly. There is prominence of the central pulmonary vasculature. Again noted mildly increased interstitial markings at both lung bases. A right-sided central venous catheter seen with the tip at the mid SVC. Overlying median sternotomy wires are present. No acute osseous abnormality. IMPRESSION: Mild cardiomegaly and pulmonary vascular congestion. Again noted are mildly increased interstitial markings of both lung bases which could be chronic lung changes versus interstitial edema. Electronically Signed   By: Prudencio Pair M.D.   On: 02/21/2020 18:35     IMPRESSION:   *    Acute on chronic anemia.  Multifactorial.  May very well have bleeding small bowel or colonic AVMs. Stools are FOBT positive.  Stools dark but she takes twice daily oral iron. Hgb improved after 2 PRBCs, 2 more ordered.  *    ESRD.  For dialysis today.  *    ASPVD, ischemic/diabetic foot ulcer.  Angiography posterior popliteal stent 2/8, left hallux amputation 2/9.  Chronic Plavix, last dose 2/23.  *   IDDM.      PLAN:     *   Stopped oral iron as it makes in confusing as to melena vs iron in stool.   Continue Linzess and daily MiraLAX, ordered.  *   Switch to oral Protonix.    *    Plan enteroscopy and colonoscopy 2/26..  *    Okay to have diabetic, renal diet.  Start clears tomorrow.  Bowel prep tomorrow  afternoon/evening.   Azucena Freed  02/22/2020, 10:37 AM Phone [phone]

## 2020-02-22 NOTE — Progress Notes (Signed)
OT Cancellation Note  Patient Details Name: [name] [name] MRN: [id number] DOB: Aug 16, 1951   Cancelled Treatment:    Reason Eval/Treat Not Completed: Patient not medically ready Pt's hemoglobin level is 6.1g/dL. Will return later as time allows and pt is appropriate.   Dorinda Hill OTR/L Acute Rehabilitation Services Office: [place] 02/22/2020, 9:52 AM

## 2020-02-22 NOTE — Progress Notes (Signed)
Family Medicine Teaching Service Daily Progress Note Intern Pager: [phone]  Patient name: [name] [name] Medical record number: [id number] Date of birth: [birth year] Age: 69 y.o. Gender: female  Primary Care Provider: [facility]: GI, Nephrology, Vascular Code Status: Full  Pt Overview and Major Events to Date:  02/23-admitted  Assessment and Plan: [name] [name] is a 68 y.o. female presenting with symptomatic anemia. 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  Acute on chronic symptomatic Anemia, h/o GI bleed Soft blood pressures overnight.  SBP 90-114/46-59.  Reports no use of anticoagulants.  History of blood transfusions.  Previous admission in October patient had upper GI which revealed an esophageal stricture, 5 cm hiatal hernia and Cameron erosions but no active bleeding.  A colonoscopy was scheduled at that time but patient was unable to tolerate bowel prep so had not been completed.  Last colonoscopy in 2017 showed a nonbleeding colonic AVM.  During that admission GI posterior that it was likely chronic blood loss and patient may become transfusion dependent.  Home medications include iron supplements.  Hemoglobin 6.1 status post 2 units PRBCs.  Denies any headaches, dizziness, chest pain or shortness of breath. -Additional 2 units PRBC ordered -Follow-up H&H -Transfusion threshold <8 -GI consult, appreciate recommendations -N.p.o. until seen by GI -IV Protonix 40 mg twice daily -PT/OT eval -Up with assistance -Vital signs per unit -SCDs -CBC in a.m.  ESRD on HD Chronic.  Stable.  Missed dialysis yesterday as just not feeling well.  Dialysis schedule Tuesdays, Thursdays, Saturdays.   On exam patient appears euvolemic.  She has a right upper chest HD cath, dressing site intact without any erythema.  Revision of left arm cephalic vein AV fistula (02/07/2020) -Consult nephro, recommendations appreciated -Strict I's and O -Daily weights -Monitoring fluid status -Oral hydration -BMP in a.m.  CAD s/p CABG CHF  H/O PAF/A.flutter Chronic.  Stable.  Home medications include ASA, Plavix and pravastatin.  Nitroglycerin as needed.  Denies any chest pain or shortness of breath. -Hold aspirin and Plavix -Continue nitroglycerin as needed -Continue pravastatin 40 mg daily  HTN Soft blood pressures.  Home medication includes Lopressor, due to low BP held overnight. -Continue to hold Lopressor, restart when pressure tolerates -Consider midodrine if pressure remains low  PAD  s/p L great toe amputation Stable.  Aortogram and atherectomy of left FSA and popliteal arteries as well as stenting of left popliteal artery (02/06/20).  Amputation of left great toe for gangrene (02/07/20).  Wound care initially consulted for dressing change but  deferred to vascular.  -Vascular consulted for dressing change and consider low-dose ASA -Plavix and ASA held given GI bleed  DMT2 Patient is currently NPO. Last A1c was 5 in January 2021. Home medications include Novolog and Humalog 2U SQ TID AC for both.  - sensitive SSI with CBGs with meals once able to take PO, otherwise monitor with BMP.  Peripheral Neuropathy Continue home med gabapentin 300 mg p.o. nightly  GERD Home medication Protonix p.o. -Start IV protonix   Asthma Home medications include ProAir, Atrovent, and Dulera. -Continue Dulera 2puffs q4h PRN wheezing, albuterol 2 puff q6h PRN wheezing  Polysubstance use Tobacco use.  Currently on Wellbutrin 150 twice daily.  Patient also endorsed  occasional cocaine use (07/20) -Encourage smoking cessation -Courage abstinence of cocaine -NicoDerm patch if patient  requests  MDD, GAD Stable. Continue home medication Wellbutrin 150 mg BID.   Gout Continue home meds: allopurinol 100 mg p.o. twice daily - consider dose adjustment outpatient given ESRD  Afib/flutter New onset noted at 09/2019 admission for GI bleed. EKG NSR on admission. Not anticoagulated due to h/o GI bleeds and frequent falls. CHADSVASc 8 with stroke risk >10%, HAS-BLED 5 suggesting alternative to anticoagulation.    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 RV with normal systolic function.   FEN/GI: NPO pending GI recommendations Prophylaxis: SCDs for DVT ppx   Disposition: ?[facility] SNF, Pending PT/OT, GI and nephro recomendations  Subjective:  No acute events overnight.  Patient reports she feels good.  Is any headaches, dizziness, shortness of breath or chest pain.  Does report having mild pain at left toe amputation site.  Reports she is hungry and cannot wait to eat.  Objective: Temp:  [97.4 F (36.3 C)-98.7 F (37.1 C)] 98.7 F (37.1 C) (02/24 0535) Pulse Rate:  [44-73] 73 (02/24 0535) Resp:  [9-22] 18 (02/24 0535) BP: (77-118)/(26-79) 90/46 (02/24 0535) SpO2:  [92 %-100 %] 96 % (02/24 0535) Weight:  [372.9 kg] 118.8 kg (02/23 1611) Physical Exam: General: Pleasant 69 year old female in no acute distress Cardiovascular: Regular rate and rhythm, no murmurs or gallops appreciated, distal pulses present bilaterally Respiratory: Chest clear to auscultation bilaterally, no crackles or wheezes noted, good cap refill Abdomen: Obese, soft, nontender, nondistended, bowel sounds present Extremities: No lower extremity edema noted.  Left foot wrapped in dressing status post amputation left great toe  Laboratory: Recent Labs  Lab 02/21/20 1655 02/22/20 0212  WBC 10.3 10.4  HGB 4.0* 6.1*  HCT 14.1* 19.6*  PLT 370 312   Recent Labs  Lab 02/21/20 1655 02/22/20 0212  NA 136 139  K 4.5 4.1  CL 95* 98  CO2 24 26  BUN  83* 84*  CREATININE 7.67* 7.85*  CALCIUM 7.6* 7.5*  GLUCOSE 167* 119*      Imaging/Diagnostic Tests: DG Chest Portable 1 View  Result Date: 02/21/2020 CLINICAL DATA:  Feeling sluggish EXAM: PORTABLE CHEST 1 VIEW COMPARISON:  January 31, 2020 FINDINGS: The heart size and mediastinal contours are unchanged with mild cardiomegaly. There is prominence of the central pulmonary vasculature. Again noted mildly increased interstitial markings at both lung bases. A right-sided central venous catheter seen with the tip at the mid SVC. Overlying median sternotomy wires are present. No acute osseous abnormality. IMPRESSION: Mild cardiomegaly and pulmonary vascular congestion. Again noted are mildly increased interstitial markings of both lung bases which could be chronic lung changes versus interstitial edema. Electronically Signed   By: Prudencio Pair M.D.   On: 02/21/2020 18:35    Carollee Leitz, MD 02/22/2020, 6:55 AM PGY-1, [place] Intern pager: [phone], text pages welcome

## 2020-02-22 NOTE — Procedures (Signed)
   I was present at this dialysis session, have reviewed the session itself and made  appropriate changes [name] MD [place] pager [phone]   02/22/2020, 3:45 PM

## 2020-02-22 NOTE — Progress Notes (Signed)
PT Cancellation Note  Patient Details Name: [name] [name] MRN: [id number] DOB: 1951/12/11   Cancelled Treatment:    Reason Eval/Treat Not Completed: Medical issues which prohibited therapy.  Pt was not gotten up this AM due to 6.1 hgb, will try again as time and pt allow.   Ramond Dial 02/22/2020, 11:56 AM  Mee Hives, PT MS Acute Rehab Dept. Number: [place] [facility] [place]

## 2020-02-22 NOTE — Consult Note (Signed)
[facility] Renal Consultation Note    Indication for Consultation:  Management of ESRD/hemodialysis, anemia, hypertension/volume, and secondary hyperparathyroidism. PCP:  HPI: [name] [name] is a 69 y.o. female with ESRD, CAD (Hx CABG), PVD (s/p L great toe amputation 02/07/20), T2DM, obesity, combined HF who is being admitted with symptomatic anemia.  She reports that has been feeling tired/weak for a while - worse yesterday and didn't feel that could make it to dialysis. Her SNF was notifed that Hgb drawn on 2/18 at HD resulted at 6.2. Given symptomatic anemia - she was brought to the ED for evaluation. She just had L great toe amputation two weeks ago. Denies CP, dyspnea, abdominal pain, N/V, diarrhea, fever, or chills. When asked about GI blood loss, denies red blood, does endorse black stools at times but attributes it to iron-based binders. In ED - she was hypotensive and intake labs showed Hgb 4, normal platelets, K 4.5, Ca 7.6, tsat 55%. FOBT checked with was positive. She was transfused 2U PRBCs (which improved her BP) and admitted for GIB concern. Today - her Hgb remains low at 6.1.  Dialyzes at [facility] on TTS sched - last treatment this past Saturday which she cut short. Missed her HD yesterday.  Past Medical History:  Diagnosis Date  . Abscess   . Acute blood loss anemia 08/17/2019  . Acute pulmonary edema (HCC)   . Acute respiratory failure ([place]) 10/18/2014  . Acute respiratory failure with hypoxia and hypercapnia ([place]) 06/01/2019  . AMS (altered mental status) 01/01/2020  . Anemia 08/2016  . Angiodysplasia of colon   . Arthritis of left shoulder region 03/23/2013  . Bleeding gastrointestinal   . Cardiomegaly 05/2019  . Chest pain 04/17/2016  . Chronic combined systolic and diastolic CHF (congestive heart failure) (HCC)    a. EF 40-45%, mild LVH, mid apicalanteroseptal and apical HK.  . CKD (chronic kidney disease), stage III   . Cocaine abuse ([place])    crack cocaine  heavily until 2008 then sporadic use since then  . Coronary artery disease    a. 06/2012 NSTEMI/CABG x 3 (LIMA->LAD, VG->OM2, VG->LCX);  b. 04/2015 MV: EF<30%, mid ant, apicalanterior, apical infarct;  c. 04/2015 Cath: LM nl, LAD 90p, LCX 60m, OM1 min irregs, RCA mild dzs, LIMA->LAD nl w/ dist LAD dzs, VG->OM2 nl, VG->LCX nl-->Med Rx.  . CVA (cerebral infarction)    a. right internal capsule stroke in 12/2006  . Demand ischemia ([place])   . Diabetes mellitus    diagnosed in 2008  . Elevated troponin 04/27/2019  . Essential hypertension   . Glaucoma   . Gout   . Heme positive stool   . HFrEF (heart failure with reduced ejection fraction) ([place])   . Hyperlipidemia   . Hyperparathyroidism, secondary renal ([place])   . Hypertensive crisis 06/02/2019  . Left-sided sensory deficit present   . Lobar pneumonia ([place]) 04/27/2019  . Obesity, morbid ([place])   . Pneumonia   . Pulmonary edema 05/2019  . PVD (peripheral vascular disease) ([place])    a. 06/2012 ABI's: R - 0.73, L - 0.71.  Marland Kitchen Renal mass, right   . Sepsis ([place]) 04/27/2019  . Shortness of breath dyspnea   . Stroke ([place])   . Thrombocytosis ([place]) 04/17/2016  . Thyroid nodule    FNA in 0998 showed follicular cells but not definate neoplasm  . Tobacco abuse   . Trichomoniasis    Past Surgical History:  Procedure Laterality Date  . ABDOMINAL AORTOGRAM W/LOWER EXTREMITY Bilateral  02/06/2020   Procedure: ABDOMINAL AORTOGRAM W/LOWER EXTREMITY;  Surgeon: Waynetta Sandy, MD;  Location: [place] CV LAB;  Service: Cardiovascular;  Laterality: Bilateral;  . AMPUTATION Left 02/07/2020   Procedure: AMPUTATION DIGIT - LEFT GREAT TOE;  Surgeon: Waynetta Sandy, MD;  Location: [facility];  Service: Vascular;  Laterality: Left;  . AV FISTULA PLACEMENT Left 08/25/2019   Procedure: ARTERIOVENOUS (AV) BRACHIOCEPHALIC FISTULA CREATION;  Surgeon: Waynetta Sandy, MD;  Location: [place];  Service: Vascular;  Laterality: Left;  . CARDIAC CATHETERIZATION     . CARDIAC CATHETERIZATION N/A 05/17/2015   Procedure: Left Heart Cath and Cors/Grafts Angiography;  Surgeon: [name], MD;  Location: [place] CV [facility];  Service: Cardiovascular;  Laterality: N/A;  . COLONOSCOPY WITH PROPOFOL N/A 04/21/2016   Procedure: COLONOSCOPY WITH PROPOFOL;  Surgeon: Irene Shipper, MD;  Location: [place];  Service: Endoscopy;  Laterality: N/A;  . CORONARY ARTERY BYPASS GRAFT  07/09/2012   Procedure: CORONARY ARTERY BYPASS GRAFTING (CABG);  Surgeon: Ivin Poot, MD;  Location: [place];  Service: Open Heart Surgery;  Laterality: N/A;  . ENTEROSCOPY N/A 10/07/2019   Procedure: ENTEROSCOPY;  Surgeon: Jackquline Denmark, MD;  Location: [facility] ENDOSCOPY;  Service: Endoscopy;  Laterality: N/A;  . ESOPHAGOGASTRODUODENOSCOPY N/A 04/20/2016   Procedure: ESOPHAGOGASTRODUODENOSCOPY (EGD);  Surgeon: Gatha Mayer, MD;  Location: [facility] ENDOSCOPY;  Service: Endoscopy;  Laterality: N/A;  . ESOPHAGOGASTRODUODENOSCOPY (EGD) WITH PROPOFOL N/A 08/17/2019   Procedure: ESOPHAGOGASTRODUODENOSCOPY (EGD) WITH PROPOFOL;  Surgeon: Irene Shipper, MD;  Location[facility] ENDOSCOPY;  Service: Endoscopy;  Laterality: N/A;  . INSERTION OF DIALYSIS CATHETER Right 08/25/2019   Procedure: INSERTION OF DIALYSIS CATHETER;  Surgeon: Waynetta Sandy, MD;  Location: [place];  Service: Vascular;  Laterality: Right;  . IR FLUORO GUIDE CV LINE RIGHT  08/19/2019  . IR US GUIDE VASC ACCESS RIGHT  08/19/2019  . LEFT HEART CATHETERIZATION WITH CORONARY ANGIOGRAM N/A 06/29/2012   Procedure: LEFT HEART CATHETERIZATION WITH CORONARY ANGIOGRAM;  Surgeon: Peter M Martinique, MD;  Location: [facility] CATH LAB;  Service: Cardiovascular;  Laterality: N/A;  . PERIPHERAL VASCULAR ATHERECTOMY Left 02/06/2020   Procedure: PERIPHERAL VASCULAR ATHERECTOMY;  Surgeon: Waynetta Sandy, MD;  Location: [place] CV LAB;  Service: Cardiovascular;  Laterality: Left;  SFA  . PERIPHERAL VASCULAR INTERVENTION Left 02/06/2020   Procedure: PERIPHERAL  VASCULAR INTERVENTION;  Surgeon: Waynetta Sandy, MD;  Location: [place] CV LAB;  Service: Cardiovascular;  Laterality: Left;  SFA  . REVISON OF ARTERIOVENOUS FISTULA Left 02/07/2020   Procedure: REVISON OF ARTERIOVENOUS FISTULA;  Surgeon: Waynetta Sandy, MD;  Location: [place];  Service: Vascular;  Laterality: Left;  . STERNAL WOUND DEBRIDEMENT  08/17/2012   Procedure: STERNAL WOUND DEBRIDEMENT;  Surgeon: Ivin Poot, MD;  Location: [place];  Service: Thoracic;  Laterality: N/A;  wound vac application  . STERNAL WOUND DEBRIDEMENT  08/24/2012   Procedure: STERNAL WOUND DEBRIDEMENT;  Surgeon: Ivin Poot, MD;  Location: [place];  Service: Thoracic;  Laterality: N/A;  . STERNAL WOUND DEBRIDEMENT  09/01/2012   Procedure: STERNAL WOUND DEBRIDEMENT;  Surgeon: Ivin Poot, MD;  Location: [place];  Service: Thoracic;  Laterality: N/A;  . STERNAL WOUND DEBRIDEMENT  09/20/2012   Procedure: STERNAL WOUND DEBRIDEMENT;  Surgeon: Ivin Poot, MD;  Location: [facility] OR;  Service: Thoracic;  Laterality: N/A;  wound vac change  . SUBMUCOSAL TATTOO INJECTION  10/07/2019   Procedure: SUBMUCOSAL TATTOO INJECTION;  Surgeon: Jackquline Denmark, MD;  Location: [facility]  ENDOSCOPY;  Service: Endoscopy;;   Family History  Problem Relation Age of Onset  . Diabetes Mother   . Hypertension Mother   . Cancer Mother   . Hyperlipidemia Father   . Hypertension Father   . Kidney disease Father   . Gout Father   . Cerebrovascular Accident Father   . Other Other        no known family CAD   Social History:  reports that she has been smoking cigarettes. She has a 12.50 pack-year smoking history. She has never used smokeless tobacco. She reports current drug use. Drug: Cocaine. She reports that she does not drink alcohol.  ROS: As per HPI otherwise negative.  Physical Exam: Vitals:   02/21/20 2334 02/22/20 0535 02/22/20 0800 02/22/20 0900  BP: 97/79 (!) 90/46  (!) 113/54  Pulse: 63 73 71   Resp: 18 18 18     Temp: 98.5 F (36.9 C) 98.7 F (37.1 C) 98.7 F (37.1 C)   TempSrc: Oral Oral Oral   SpO2: 95% 96% 100%   Weight:      Height:         General: Well developed, obese woman in no acute distress. Head: Normocephalic, atraumatic, sclera non-icteric, mucus membranes are moist. Neck: Supple without lymphadenopathy/masses. JVD not elevated. Lungs: Clear bilaterally to auscultation without wheezes, rales, or rhonchi. Breathing is unlabored. Heart: RRR with normal S1, S2. No murmurs, rubs, or gallops appreciated. Abdomen: Soft, non-tender, non-distended with normoactive bowel sounds.  Musculoskeletal:  Strength and tone appear normal for age. Lower extremities: No RLE edema. L foot bandaged with 1+ edema over sockline Neuro: Alert and oriented X 3. Moves all extremities spontaneously. Psych:  Responds to questions appropriately with a normal affect. Dialysis Access: TDC + new LUE AVF + thrill  Allergies  Allergen Reactions  . Naproxen Rash   Prior to Admission medications   Medication Sig Start Date End Date Taking? Authorizing Provider  allopurinol (ZYLOPRIM) 100 MG tablet Take 100 mg by mouth 2 (two) times daily.  11/25/17  Yes [provider]  aspirin EC 325 MG tablet Take 325 mg by mouth daily. 10/28/19  Yes [provider]  buPROPion (WELLBUTRIN SR) 150 MG 12 hr tablet Take 1 tablet (150 mg total) by mouth 2 (two) times daily. 01/16/16  Yes Mikhail, Velta Addison, DO  cetirizine (ZYRTEC) 10 MG tablet Take 10 mg by mouth daily as needed for allergies.    Yes [provider]  clopidogrel (PLAVIX) 75 MG tablet Take 1 tablet (75 mg total) by mouth daily with breakfast. 02/11/20  Yes Aline August, MD  ferrous sulfate 325 (65 FE) MG tablet Take 325 mg by mouth 2 (two) times daily with a meal.    Yes [provider]  gabapentin (NEURONTIN) 300 MG capsule Take 1 capsule (300 mg total) by mouth at bedtime. 02/10/20  Yes Aline August, MD  insulin aspart (NOVOLOG)  100 UNIT/ML injection Inject 2 Units into the skin 3 (three) times daily with meals. 02/10/20  Yes Aline August, MD  insulin lispro (HUMALOG) 100 UNIT/ML injection Inject 2 Units into the skin 3 (three) times daily before meals.   Yes [provider]  latanoprost (XALATAN) 0.005 % ophthalmic solution Place 1 drop into both eyes at bedtime.   Yes [provider]  LINZESS 72 MCG capsule Take 72 mcg by mouth daily before breakfast.  11/21/17  Yes [provider]  metoprolol tartrate (LOPRESSOR) 25 MG tablet Take 1 tablet (25 mg total) by  mouth 2 (two) times daily. 02/10/20  Yes Aline August, MD  mometasone-formoterol (DULERA) 100-5 MCG/ACT AERO Inhale 2 puffs into the lungs every 4 (four) hours as needed for wheezing.   Yes [provider]  multivitamin (RENA-VIT) TABS tablet Take 1 tablet by mouth at bedtime. 02/10/20  Yes Aline August, MD  nitroGLYCERIN (NITROSTAT) 0.4 MG SL tablet Place 1 tablet (0.4 mg total) under the tongue every 5 (five) minutes as needed for chest pain. 04/30/19  Yes [name], MD  oxyCODONE (OXY IR/ROXICODONE) 5 MG immediate release tablet Take 1 tablet (5 mg total) by mouth every 6 (six) hours as needed for moderate pain. 02/10/20  Yes Rhyne, Samantha J, PA-C  polyethylene glycol (MIRALAX) 17 g packet Take 17 g by mouth daily as needed for moderate constipation. 02/10/20  Yes Aline August, MD  pravastatin (PRAVACHOL) 40 MG tablet Take 40 mg by mouth daily. 10/28/19  Yes [provider]  PROAIR HFA 108 (90 Base) MCG/ACT inhaler Inhale 2 puffs into the lungs every 6 (six) hours as needed for wheezing.  11/17/17  Yes [provider]  TUMS 500 MG chewable tablet Chew 2 tablets by mouth at bedtime. 01/19/20  Yes [provider]  VELPHORO 500 MG chewable tablet Chew 2 tablets (1,000 mg total) by mouth 3 (three) times daily with meals. 02/10/20  Yes Aline August, MD  pantoprazole (PROTONIX) 40 MG tablet Take 1 tablet (40 mg  total) by mouth daily. Patient not taking: Reported on 02/21/2020 08/26/19   Jeanmarie Hubert, MD   Current Facility-Administered Medications  Medication Dose Route Frequency Provider Last Rate Last Admin  . 0.9 %  sodium chloride infusion (Manually program via Guardrails IV Fluids)   Intravenous Once Rory Percy, DO      . 0.9 %  sodium chloride infusion  10 mL/hr Intravenous Once Rory Percy, DO      . acetaminophen (TYLENOL) tablet 650 mg  650 mg Oral Q6H PRN Rory Percy, DO   650 mg at 02/22/20 1791   Or  . acetaminophen (TYLENOL) suppository 650 mg  650 mg Rectal Q6H PRN Rory Percy, DO      . albuterol (PROVENTIL) (2.5 MG/3ML) 0.083% nebulizer solution 3 mL  3 mL Inhalation Q6H PRN Rory Percy, DO      . [START ON 02/23/2020] allopurinol (ZYLOPRIM) tablet 100 mg  100 mg Oral Daily Lockamy, Timothy, DO      . atorvastatin (LIPITOR) tablet 40 mg  40 mg Oral q1800 Lockamy, Timothy, DO      . buPROPion (WELLBUTRIN SR) 12 hr tablet 150 mg  150 mg Oral BID Rory Percy, DO   150 mg at 02/22/20 1013  . Chlorhexidine Gluconate Cloth 2 % PADS 6 each  6 each Topical Daily McDiarmid, Blane Ohara, MD   6 each at 02/22/20 [phone]  . gabapentin (NEURONTIN) capsule 300 mg  300 mg Oral QHS Rory Percy, DO   300 mg at 02/21/20 2325  . insulin aspart (novoLOG) injection 0-6 Units  0-6 Units Subcutaneous TID WC Rumball, Alison, DO      . latanoprost (XALATAN) 0.005 % ophthalmic solution 1 drop  1 drop Both Eyes QHS Rumball, Alison, DO      . mometasone-formoterol (DULERA) 100-5 MCG/ACT inhaler 2 puff  2 puff Inhalation BID PRN Rory Percy, DO      . multivitamin (RENA-VIT) tablet 1 tablet  1 tablet Oral QHS Rory Percy, DO   1 tablet at 02/21/20 2325  . nitroGLYCERIN (NITROSTAT) SL  tablet 0.4 mg  0.4 mg Sublingual Q5 min PRN Rory Percy, DO      . oxyCODONE (Oxy IR/ROXICODONE) immediate release tablet 5 mg  5 mg Oral Q6H PRN Gladys Damme, MD   5 mg at 02/22/20 0855  . [START  ON 02/23/2020] pantoprazole (PROTONIX) EC tablet 40 mg  40 mg Oral Q0600 Vena Rua, PA-C      . sucroferric oxyhydroxide [facility]) chewable tablet 1,000 mg  1,000 mg Oral TID WC Rory Percy, DO       Labs: Basic Metabolic Panel: Recent Labs  Lab 02/21/20 1655 02/22/20 0212  NA 136 139  K 4.5 4.1  CL 95* 98  CO2 24 26  GLUCOSE 167* 119*  BUN 83* 84*  CREATININE 7.67* 7.85*  CALCIUM 7.6* 7.5*   CBC: Recent Labs  Lab 02/21/20 1655 02/22/20 0212  WBC 10.3 10.4  NEUTROABS 7.8*  --   HGB 4.0* 6.1*  HCT 14.1* 19.6*  MCV 94.0 91.2  PLT 370 312   CBG: Recent Labs  Lab 02/21/20 2234 02/22/20 0733  GLUCAP 139* 78   Iron Studies:  Recent Labs    02/21/20 1655  IRON 134  TIBC 242*  FERRITIN 123   Studies/Results: DG Chest Portable 1 View  Result Date: 02/21/2020 CLINICAL DATA:  Feeling sluggish EXAM: PORTABLE CHEST 1 VIEW COMPARISON:  January 31, 2020 FINDINGS: The heart size and mediastinal contours are unchanged with mild cardiomegaly. There is prominence of the central pulmonary vasculature. Again noted mildly increased interstitial markings at both lung bases. A right-sided central venous catheter seen with the tip at the mid SVC. Overlying median sternotomy wires are present. No acute osseous abnormality. IMPRESSION: Mild cardiomegaly and pulmonary vascular congestion. Again noted are mildly increased interstitial markings of both lung bases which could be chronic lung changes versus interstitial edema. Electronically Signed   By: Prudencio Pair M.D.   On: 02/21/2020 18:35   Dialysis Orders:  TTS at [facility] --> last HD Sat 2/20 4:15hr, 400/800, EDW 118kg, 2K/3Ca, TDC + maturing AVF, no heparin - Micera 246mcg IV q 2 weeks (last 2/18 - Hgb 6/2 at time) - Calcitriol 77mcg PO q HD  Assessment/Plan: 1. Symptomatic anemia/GIB: Hgb 4 on admit with + FOBT. GI involved - plan for EGD/colonoscopy on 2/26. S/p 2U PRBCs - will give another 2 on HD today. 2.  ESRD: Usual TTS  sched - missed last HD. Will run her today and tranfuse her while on HD. 3.  Hypertension/volume: BP low stable. 4.  Anemia of ESRD + ABLA: See above. Just given ESA as outpatient. 5.  Metabolic bone disease: Ca low side - will using 3.5Ca bath with HD today. 6.  CAD (Hx CABG) 7.  PAD (s/p recent L great toe aputation) 8.  T2DM   Veneta Penton, Hershal Coria 02/22/2020, 11:16 AM  [facility] Pager: [phone]

## 2020-02-22 NOTE — Discharge Summary (Addendum)
[facility] Discharge Summary  Patient name: [name] [name] Medical record number: [id number] Date of birth: 25-Apr-1951 Age: 69 y.o. Gender: female Date of Admission: 02/21/2020  Date of Discharge: 02/28/2020 Admitting Physician: Rory Percy, DO  Primary Care Provider: [facility] Consultants: GI, Nephrology, Vascular Surgery  Indication for Hospitalization: Symptomatic anemia  Discharge Diagnoses/Problem List:  Symptomatic Anemia ESRD- HD T,T,S CAD s/p CABG HFmrEF Afib/Aflutter HTN PAD Lt Hallux amputation DM Type II Peripheral Neuropathy GERD Asthma Polysubstance use MDD/GAD Gout  Disposition: to SNF  Discharge Condition: Stable and improved  Discharge Exam:  Vitals with BMI 02/28/2020 02/28/2020 02/28/2020  Height - - -  Weight - - -  BMI - - -  Systolic 96 387 564  Diastolic 67 72 332  Pulse 116 114 113  General: Alert and oriented, sitting up in chair, working with PT Cardiovascular: Distant heart sounds, normal rate, normal rhythm. Respiratory: Lungs clear to auscultation bilaterally.  No crackles or wheezes. Abdomen: Soft, nontender to palpation.  Normal bowel sounds. Extremities: Left great toe status post amputation.  No pus, no erythema, old crusted blood present  Brief Hospital Course:   Anemia Patient from [facility] and admitted for symptomatic anemia, Hbg 6.2 at dialysis, on admission Hgb 4.0.  She was given 2u PRBC initially and Hbg s/p transfusion 6.1>7.7.  She was given an additional 3 units PRBC's and dialyzed on the morning of admission.  GI was consulted for further evaluation.  She completed a bowel prep and on the day of scheduled colonoscopy she was noted to be lethargic and difficult to arouse.  MRI brain negative and colonoscopy was cancelled.  Later that afternoon she was back to her baseline.  GI was notified and planned for colonoscopy the next day.  Colonoscopy done 02/27 and was  impressive for a single actively bleeding colonic angioectasia that was successfully treated with hemostatic clip.  A 74mm polyp at the hepatic flexure and non bleeding internal hemorrhoids.  A small bowel enteroscopy was perfomed at the same time and was impressive for a 5 cm hiatal hernia, non obstructing Schatzki ring, gastric antral vascular ectasia without bleeding and treated with APC, a single non-bleeding angioectasia in the duodenum also treated with APC.  On 3/1 patient had anemia to 7.8 (transfusion level <8 due to impressive cardiac history). Patient received 1U pRBCs on 3/1. Post-transfusion hgb 9.6. On 3/2 hgb 8.8, no evidence of bleeding (no melena or blood in BMs). Patient received HD on 3/2 and was discharged to SNF, [facility]  PAD  s/p L hallux amputation Patient had aortogram with arthrectomy of the left SFA and popliteal arteries as well as stenting of left popliteal artery on February 06, 2020, she subsequently had amputation of left hallux for gangrene on February 07, 2020 by vascular surgery, Dr. Servando Snare.  During this admission amputation site did not appear infected.  Dr. Donzetta Matters ordered a vascular ultrasound with ABIs on bilateral LEs. The [place] was technically difficult due to patient noncooperation, however, ABI on the R leg found to be 0.49, severe arterial disease. L leg ABI 0.89. Patient to follow up with Dr. Donzetta Matters as an outpatient on 03/02/20 at 8:20 AM.  Issues for Follow Up:  1. Plavix discontinued due to GI bleed, per vascular surgery recommendations 2. Continue low dose ASA 81 mg daily 3. Please recheck Hgb at hemodialysis on 03/01/20. This is likely chronic problem and will need regular CBC checks and perhaps  chronic transfusions. Recommend follow up with PCP and nephrology for IV iron. 4. Follow up with vascular surgery for removal of sutures from L hallux amputation site as well   Significant Procedures: Colonoscopy and Small Bowel Enteroscopy 02/27  Significant Labs and  Imaging:  Recent Labs  Lab 02/26/20 0715 02/26/20 0715 02/27/20 0329 02/27/20 1706 02/28/20 0434  WBC 9.1  --  8.6  --  9.8  HGB 8.2*   < > 7.8* 9.6* 8.8*  HCT 28.5*   < > 26.3* 31.7* 28.2*  PLT 181  --  197  --  220   < > = values in this interval not displayed.   Recent Labs  Lab 02/23/20 0408 02/24/20 0844 02/24/20 1148 02/24/20 1148 02/25/20 0443 02/25/20 0443 02/26/20 0715 02/26/20 0715 02/27/20 0329 02/28/20 1300  NA 136   < > 143  --  143  --  140  --  142 139  K 3.6   < > 3.7   < > 3.5   < > 3.8   < > 3.9 4.1  CL 96*   < > 105  --  104  --  102  --  103 103  CO2 26   < > 25  --  25  --  28  --  25 23  GLUCOSE 90   < > 85  --  84  --  164*  --  147* 150*  BUN 48*   < > 27*  --  28*  --  19  --  36* 55*  CREATININE 5.21*   < > 4.17*  --  4.47*  --  3.57*  --  4.56* 5.39*  CALCIUM 8.2*   < > 8.2*  --  8.3*  --  8.3*  --  8.2* 7.8*  MG 1.6*  --   --   --   --   --   --   --   --   --   PHOS  --   --  4.0  --  4.7*  --  3.8  --  4.6 5.4*  ALBUMIN  --   --  2.3*  --  2.3*  --  2.3*  --  2.3* 2.3*   < > = values in this interval not displayed.   Results/Tests Pending at Time of Discharge: none  Discharge Medications:  Allergies as of 02/28/2020      Reactions   Naproxen Rash      Medication List    STOP taking these medications   clopidogrel 75 MG tablet Commonly known as: PLAVIX   insulin lispro 100 UNIT/ML injection Commonly known as: HUMALOG   pravastatin 40 MG tablet Commonly known as: PRAVACHOL     TAKE these medications   allopurinol 100 MG tablet Commonly known as: ZYLOPRIM Take 1 tablet (100 mg total) by mouth daily. What changed: when to take this   aspirin 81 MG EC tablet Take 1 tablet (81 mg total) by mouth daily. What changed:   medication strength  how much to take   atorvastatin 40 MG tablet Commonly known as: LIPITOR Take 1 tablet (40 mg total) by mouth daily at 6 PM.   buPROPion 150 MG 12 hr tablet Commonly known as:  WELLBUTRIN SR Take 1 tablet (150 mg total) by mouth 2 (two) times daily.   cetirizine 10 MG tablet Commonly known as: ZYRTEC Take 10 mg by mouth daily as needed for allergies.   ferrous sulfate 325 (65  FE) MG tablet Take 325 mg by mouth 2 (two) times daily with a meal.   gabapentin 300 MG capsule Commonly known as: NEURONTIN Take 1 capsule (300 mg total) by mouth at bedtime.   insulin aspart 100 UNIT/ML injection Commonly known as: novoLOG Inject 2 Units into the skin 3 (three) times daily with meals.   latanoprost 0.005 % ophthalmic solution Commonly known as: XALATAN Place 1 drop into both eyes at bedtime.   Linzess 72 MCG capsule Generic drug: linaclotide Take 72 mcg by mouth daily before breakfast.   metoprolol tartrate 25 MG tablet Commonly known as: LOPRESSOR Take 1 tablet (25 mg total) by mouth 2 (two) times daily.   mometasone-formoterol 100-5 MCG/ACT Aero Commonly known as: DULERA Inhale 2 puffs into the lungs every 4 (four) hours as needed for wheezing.   multivitamin Tabs tablet Take 1 tablet by mouth at bedtime.   nitroGLYCERIN 0.4 MG SL tablet Commonly known as: NITROSTAT Place 1 tablet (0.4 mg total) under the tongue every 5 (five) minutes as needed for chest pain.   oxyCODONE 5 MG immediate release tablet Commonly known as: Oxy IR/ROXICODONE Take 1 tablet (5 mg total) by mouth every 6 (six) hours as needed for moderate pain.   pantoprazole 40 MG tablet Commonly known as: Protonix Take 1 tablet (40 mg total) by mouth daily.   polyethylene glycol 17 g packet Commonly known as: MiraLax Take 17 g by mouth daily as needed for moderate constipation.   ProAir HFA 108 (90 Base) MCG/ACT inhaler Generic drug: albuterol Inhale 2 puffs into the lungs every 6 (six) hours as needed for wheezing.   Tums 500 MG chewable tablet Generic drug: calcium carbonate Chew 2 tablets by mouth at bedtime.   Velphoro 500 MG chewable tablet Generic drug: sucroferric  oxyhydroxide Chew 2 tablets (1,000 mg total) by mouth 3 (three) times daily with meals.       Discharge Instructions: Please refer to Patient Instructions section of EMR for full details.  Patient was counseled important signs and symptoms that should prompt return to medical care, changes in medications, dietary instructions, activity restrictions, and follow up appointments.   Follow-Up Appointments: Follow-up Information    [facility] Schedule an appointment as soon as possible for a visit.   Contact information: [place] [address] [phone]        Waynetta Sandy, MD. Go on 03/02/2020.   Specialties: Vascular Surgery, Cardiology Why: Go to appt at 8:20 AM  Contact information: [place] [address] [phone]           Gladys Damme, MD 02/28/2020, 4:19 PM PGY-1, [place]

## 2020-02-22 NOTE — Consult Note (Signed)
[place] Nurse Consult Note: Patient receiving care in [facility] [phone].  Consult completed remotely after review of record. Reason for Consult: "wound, L great toe amputation 02/07/20) Wound type: Surgical I sent the following SecureChat message to Attending of record, Dr. [name] McDiarmid, "Vascular Surgeon Servando Snare amputated the toe on 2/9 of this year. Please consult Dr. Donzetta Matters for orders on the amputation site. Thank you, Claudina Lick nurse" I received the following SecureChat instructions:  "Ms Loth is on the [facility] Medicine Teaching Service. Please contact the resident on call ([phone]) for questions or concerns. Thank you, [name]"  I spoke with Dr. Chauncey Reading via telephone and explained the need to reach out to Vascular Surgery, which she has agreed to do.  Val Riles, RN, MSN, CWOCN, CNS-BC, pager [phone]

## 2020-02-23 ENCOUNTER — Encounter (HOSPITAL_COMMUNITY): Payer: Self-pay | Admitting: Anesthesiology

## 2020-02-23 DIAGNOSIS — Z992 Dependence on renal dialysis: Secondary | ICD-10-CM

## 2020-02-23 LAB — BPAM RBC
Blood Product Expiration Date: 202103222359
Blood Product Expiration Date: 202103262359
Blood Product Expiration Date: 202103262359
Blood Product Expiration Date: 202103262359
Blood Product Expiration Date: 202103262359
ISSUE DATE / TIME: 202102231823
ISSUE DATE / TIME: 202102232059
ISSUE DATE / TIME: 202102241247
ISSUE DATE / TIME: 202102241403
ISSUE DATE / TIME: 202102242211
Unit Type and Rh: 5100
Unit Type and Rh: 5100
Unit Type and Rh: 5100
Unit Type and Rh: 5100
Unit Type and Rh: 5100

## 2020-02-23 LAB — TYPE AND SCREEN
ABO/RH(D): O POS
Antibody Screen: NEGATIVE
Unit division: 0
Unit division: 0
Unit division: 0
Unit division: 0
Unit division: 0

## 2020-02-23 LAB — CBC
HCT: 26 % — ABNORMAL LOW (ref 36.0–46.0)
Hemoglobin: 8.3 g/dL — ABNORMAL LOW (ref 12.0–15.0)
MCH: 28.6 pg (ref 26.0–34.0)
MCHC: 31.9 g/dL (ref 30.0–36.0)
MCV: 89.7 fL (ref 80.0–100.0)
Platelets: 213 10*3/uL (ref 150–400)
RBC: 2.9 MIL/uL — ABNORMAL LOW (ref 3.87–5.11)
RDW: 17.2 % — ABNORMAL HIGH (ref 11.5–15.5)
WBC: 10.3 10*3/uL (ref 4.0–10.5)
nRBC: 2 % — ABNORMAL HIGH (ref 0.0–0.2)

## 2020-02-23 LAB — GLUCOSE, CAPILLARY
Glucose-Capillary: 82 mg/dL (ref 70–99)
Glucose-Capillary: 206 mg/dL — ABNORMAL HIGH (ref 70–99)
Glucose-Capillary: 105 mg/dL — ABNORMAL HIGH (ref 70–99)

## 2020-02-23 LAB — BASIC METABOLIC PANEL
Anion gap: 14 (ref 5–15)
BUN: 48 mg/dL — ABNORMAL HIGH (ref 8–23)
CO2: 26 mmol/L (ref 22–32)
Calcium: 8.2 mg/dL — ABNORMAL LOW (ref 8.9–10.3)
Chloride: 96 mmol/L — ABNORMAL LOW (ref 98–111)
Creatinine, Ser: 5.21 mg/dL — ABNORMAL HIGH (ref 0.44–1.00)
GFR calc Af Amer: 9 mL/min — ABNORMAL LOW (ref >60)
GFR calc non Af Amer: 8 mL/min — ABNORMAL LOW (ref >60)
Glucose, Bld: 90 mg/dL (ref 70–99)
Potassium: 3.6 mmol/L (ref 3.5–5.1)
Sodium: 136 mmol/L (ref 135–145)

## 2020-02-23 LAB — MAGNESIUM: Magnesium: 1.6 mg/dL — ABNORMAL LOW (ref 1.7–2.4)

## 2020-02-23 MED ORDER — PEG-KCL-NACL-NASULF-NA ASC-C 100 G PO SOLR: 1.0000 | Freq: Once | ORAL | Status: DC

## 2020-02-23 MED ORDER — PEG-KCL-NACL-NASULF-NA ASC-C 100 G PO SOLR
0.5000 | Freq: Once | ORAL | Status: AC
  Administered 2020-02-23: 100 g via ORAL
  Filled 2020-02-23 (×2): qty 1

## 2020-02-23 MED ORDER — METOCLOPRAMIDE HCL 5 MG/ML IJ SOLN
10.0000 mg | Freq: Once | INTRAMUSCULAR | Status: AC
  Administered 2020-02-24: 10 mg via INTRAVENOUS
  Filled 2020-02-23: qty 2

## 2020-02-23 MED ORDER — PEG-KCL-NACL-NASULF-NA ASC-C 100 G PO SOLR
0.5000 | Freq: Once | ORAL | Status: AC
  Administered 2020-02-24: 100 g via ORAL

## 2020-02-23 MED ORDER — METOCLOPRAMIDE HCL 5 MG/ML IJ SOLN
10.0000 mg | Freq: Once | INTRAMUSCULAR | Status: AC
  Administered 2020-02-23: 10 mg via INTRAVENOUS
  Filled 2020-02-23: qty 2

## 2020-02-23 NOTE — Progress Notes (Addendum)
[facility] Progress Note   Subjective:   Seen in room - says "I feel like half a million bucks!" Did have blood stool last night - plan is for EGD/colonoscopy with GI tomorrow. Denies CP/dyspnea. Her main concern is being hungry on clear diet.  Objective Vitals:   02/22/20 2234 02/23/20 0114 02/23/20 0451 02/23/20 0525  BP: 108/60 123/66 (!) 95/31 117/61  Pulse: 72 77 71 73  Resp: 18 18 18   Temp: 98.6 F (37 C) 98.6 F (37 C) 98.4 F (36.9 C)   TempSrc: Oral Oral    SpO2: 99% 100% 100% 100%  Weight:      Height:       Physical Exam General: Overweight woman, NAD Heart: RRR; no murmur Lungs: CTA anteriorly Abdomen: soft, non-tender Extremities: Tender BLE - scattered superficial abrasions. Trace edema. Bandaged L foot. Dialysis Access: TDC + maturing LUE AVF  Additional Objective Labs: Basic Metabolic Panel: Recent Labs  Lab 02/21/20 1655 02/22/20 0212 02/23/20 0408  NA 136 139 136  K 4.5 4.1 3.6  CL 95* 98 96*  CO2 24 26 26  GLUCOSE 167* 119* 90  BUN 83* 84* 48*  CREATININE 7.67* 7.85* 5.21*  CALCIUM 7.6* 7.5* 8.2*   CBC: Recent Labs  Lab 02/21/20 1655 02/21/20 1655 02/22/20 0212 02/22/20 1955 02/23/20 0408  WBC 10.3  --  10.4  --  10.3  NEUTROABS 7.8*  --   --   --   --   HGB 4.0*   < > 6.1* 7.7* 8.3*  HCT 14.1*   < > 19.6* 24.6* 26.0*  MCV 94.0  --  91.2  --  89.7  PLT 370  --  312  --  213   < > = values in this interval not displayed.   CBG: Recent Labs  Lab 02/22/20 1147 02/22/20 1727 02/22/20 2106 02/22/20 2231 02/23/20 0649  GLUCAP 78 137* 265* 255* 82   Iron Studies:  Recent Labs    02/21/20 1655  IRON 134  TIBC 242*  FERRITIN 123   Studies/Results: DG Chest Portable 1 View  Result Date: 02/21/2020 CLINICAL DATA:  Feeling sluggish EXAM: PORTABLE CHEST 1 VIEW COMPARISON:  January 31, 2020 FINDINGS: The heart size and mediastinal contours are unchanged with mild cardiomegaly. There is prominence of the central  pulmonary vasculature. Again noted mildly increased interstitial markings at both lung bases. A right-sided central venous catheter seen with the tip at the mid SVC. Overlying median sternotomy wires are present. No acute osseous abnormality. IMPRESSION: Mild cardiomegaly and pulmonary vascular congestion. Again noted are mildly increased interstitial markings of both lung bases which could be chronic lung changes versus interstitial edema. Electronically Signed   By: Bindu  Avutu M.D.   On: 02/21/2020 18:35   Medications: . sodium chloride     . sodium chloride   Intravenous Once  . allopurinol  100 mg Oral Daily  . atorvastatin  40 mg Oral q1800  . bisacodyl  20 mg Oral Once  . buPROPion  150 mg Oral BID  . Chlorhexidine Gluconate Cloth  6 each Topical Daily  . gabapentin  300 mg Oral QHS  . insulin aspart  0-5 Units Subcutaneous QHS  . insulin aspart  0-6 Units Subcutaneous TID WC  . latanoprost  1 drop Both Eyes QHS  . linaclotide  72 mcg Oral QAC breakfast  . metoCLOPramide (REGLAN) injection  10 mg Intravenous Once   Followed by  . metoCLOPramide (REGLAN) injection  10   mg Intravenous Once  . multivitamin  1 tablet Oral QHS  . pantoprazole  40 mg Oral Q0600  . peg 3350 powder  0.5 kit Oral Once   And  . peg 3350 powder  0.5 kit Oral Once  . polyethylene glycol  17 g Oral Daily  . sucroferric oxyhydroxide  1,000 mg Oral TID WC    Dialysis Orders: TTS at [facility] --> last outpatient HD Sat 2/20 4:15hr, 400/800, EDW 118kg, 2K/3Ca, TDC + maturing AVF, no heparin - Micera 227mg IV q 2 weeks (last 2/18 - Hgb 6/2 at time) - Calcitriol 260m PO q HD  Assessment/Plan: 1. Symptomatic anemia/GIB: Hgb 4 on admit with + FOBT. GI involved - plan for EGD/colonoscopy on 2/26. S/p 4U PRBCs this admit. Reports bloody stool overnight. 2.  ESRD: Usual TTS sched - s/p HD yesterday as makeup. Short run TODAY to get back on TTS sched. No heparin. 3.  Hypertension/volume: BP low stable. 4.  Anemia of  ESRD + ABLA: See above. Just given ESA as outpatient. Hgb 8.3 today. 5.  Metabolic bone disease: Ca better - s/p 3.5Ca bath yesterday - will have to do 2.5Ca today as will need higher K bath based on today's K level. 6.  CAD (Hx CABG) 7.  PAD (s/p recent L great toe aputation) 8.  T2DM  KaVeneta PentonPAHershal Coria/25/2021, 10:09 AM  [employer]

## 2020-02-23 NOTE — Progress Notes (Signed)
Daily Rounding Note  02/23/2020, 8:41 AM  LOS: 2 days   SUBJECTIVE:   Chief complaint: FOBT positive anemia.  Acute on chronic anemia.    Blood pressures soft, no tachycardia.  Excellent oxygen saturation. 3 loose, watery stools last night, did see small amount blood. She feels well.  She is able to stand and get to the bathroom and not dizzy. Very invested in getting more broth, asking me if she can get some beef broth.  Had the clear liquid tray with chicken broth this morning.  OBJECTIVE:         Vital signs in last 24 hours:    Temp:  [97.4 F (36.3 C)-98.8 F (37.1 C)] 98.4 F (36.9 C) (02/25 0451) Pulse Rate:  [65-90] 73 (02/25 0525) Resp:  [13-22] 18 (02/25 0451) BP: (93-140)/(31-71) 117/61 (02/25 0525) SpO2:  [95 %-100 %] 100 % (02/25 0525) Weight:  [120 kg-121.7 kg] 120 kg (02/24 2059) Last BM Date: 02/22/20 Filed Weights   02/22/20 1305 02/22/20 1620 02/22/20 2059  Weight: 121.7 kg 120 kg 120 kg   General: Pleasant, alert.  Comfortable.  Looks chronically ill. Heart: RRR.  NSR in the 70s on monitor. Chest: Diminished breath sounds but no adventitious sounds.  No labored breathing.  Vocal quality raspy. Abdomen: Obese, soft, active bowel sounds, nontender. Extremities: Left pedal edema. Neuro/Psych: Does not, fluid speech.  In good spirits.  Moves all 4 limbs.  No tremors or gross deficits.  Intake/Output from previous day: 02/24 0701 - 02/25 0700 In: 1800 [P.O.:720; I.V.:100; Blood:980] Out: 1335   Intake/Output this shift: No intake/output data recorded.  Lab Results: Recent Labs    02/21/20 1655 02/21/20 1655 02/22/20 0212 02/22/20 1955 02/23/20 0408  WBC 10.3  --  10.4  --  10.3  HGB 4.0*   < > 6.1* 7.7* 8.3*  HCT 14.1*   < > 19.6* 24.6* 26.0*  PLT 370  --  312  --  213   < > = values in this interval not displayed.   BMET Recent Labs    02/21/20 1655 02/22/20 0212 02/23/20 0408   NA 136 139 136  K 4.5 4.1 3.6  CL 95* 98 96*  CO2 24 26 26   GLUCOSE 167* 119* 90  BUN 83* 84* 48*  CREATININE 7.67* 7.85* 5.21*  CALCIUM 7.6* 7.5* 8.2*   LFT No results for input(s): PROT, ALBUMIN, AST, ALT, ALKPHOS, BILITOT, BILIDIR, IBILI in the last 72 hours. PT/INR No results for input(s): LABPROT, INR in the last 72 hours. Hepatitis Panel No results for input(s): HEPBSAG, HCVAB, HEPAIGM, HEPBIGM in the last 72 hours.  Studies/Results: DG Chest Portable 1 View  Result Date: 02/21/2020 CLINICAL DATA:  Feeling sluggish EXAM: PORTABLE CHEST 1 VIEW COMPARISON:  January 31, 2020 FINDINGS: The heart size and mediastinal contours are unchanged with mild cardiomegaly. There is prominence of the central pulmonary vasculature. Again noted mildly increased interstitial markings at both lung bases. A right-sided central venous catheter seen with the tip at the mid SVC. Overlying median sternotomy wires are present. No acute osseous abnormality. IMPRESSION: Mild cardiomegaly and pulmonary vascular congestion. Again noted are mildly increased interstitial markings of both lung bases which could be chronic lung changes versus interstitial edema. Electronically Signed   By: Prudencio Pair M.D.   On: 02/21/2020 18:35     Scheduled Meds: . sodium chloride   Intravenous Once  . allopurinol  100 mg Oral Daily  .  atorvastatin  40 mg Oral q1800  . bisacodyl  20 mg Oral Once  . buPROPion  150 mg Oral BID  . Chlorhexidine Gluconate Cloth  6 each Topical Daily  . gabapentin  300 mg Oral QHS  . insulin aspart  0-5 Units Subcutaneous QHS  . insulin aspart  0-6 Units Subcutaneous TID WC  . latanoprost  1 drop Both Eyes QHS  . linaclotide  72 mcg Oral QAC breakfast  . multivitamin  1 tablet Oral QHS  . pantoprazole  40 mg Oral Q0600  . polyethylene glycol  17 g Oral Daily  . sucroferric oxyhydroxide  1,000 mg Oral TID WC   Continuous Infusions: . sodium chloride     PRN Meds:.acetaminophen **OR**  acetaminophen, albuterol, mometasone-formoterol, nitroGLYCERIN, oxyCODONE   ASSESMENT:   *    Acute on chronic anemia.  Multifactorial.  FOBT positive.  Suspicion of bleeding from small bowel and/or colonic AVMs.  Outpatient oral iron.  Current labs show no iron, folate or B12 deficie. Hgb 4 >> 5 PRBCs >> 8.3.    *    Constipation.  Outpt mgt w Linzess, MiraLAX.  *    ESRD.  Hemodialysis TTS.  *    Diabetic/ischemic foot ulcer.  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  *    ASPVD.  Chronic Plavix.  Last dose 2/23.  *     IDDM.   PLAN   *   Colonoscopy, enteroscopy tomorrow.  Patient agreeable.  *    Clear liquids today, begin split dose bowel prep early this evening.    Azucena Freed  02/23/2020, 8:41 AM Phone [phone]

## 2020-02-23 NOTE — Progress Notes (Signed)
Pt was seen for attempted eval, but was already leaving to go to HD.  Retry as time and pt allow.   02/23/20 1100  PT Visit Information  Last PT Received On 02/23/20  Reason Eval/Treat Not Completed Patient at procedure or test/unavailable   Mee Hives, PT MS Acute Rehab Dept. Number: [place] and [place]

## 2020-02-23 NOTE — Progress Notes (Signed)
The patient was in good a spirit this morning. She denies any concern.  Exam: Gen: Not in distress, obese CV: Distant heart sound, S1S2 normal, no murmur. RRR. Lungs: Air entry equal on both sides. Difficult to evaluate due to her habitus. Abd: soft, NT/ND, BS+ and normal. Ext: Trace pedal swelling.  A/P: 69 Y/O F with Acute on chronic anemia due to GI blood loss. S/P PRBS transfusion x 5. Her hemoglobin is above 8, her threshold for transfusion is <8. For colonoscopy tomorrow. Monitor CBC and hemodynamic status closely.   PAD: Continue to hold DAPT pending feedback from Vascular. May reduce ASA to 81 mg QD if needed.  ESRD on HD: Getting dialysis today per nephro.  Her other chronic problems are stable on the current regimen.  Also, see the resident's note below. I will cosign it once it is completed.

## 2020-02-23 NOTE — Plan of Care (Signed)
  Problem: Activity: Goal: Risk for activity intolerance will decrease Outcome: Progressing   Problem: Nutrition: Goal: Adequate nutrition will be maintained Outcome: Progressing   

## 2020-02-23 NOTE — Progress Notes (Signed)
Family Medicine Teaching Service Daily Progress Note Intern Pager: [phone]  Patient name: [name] [name] Medical record number: [id number] Date of birth: Aug 30, 1951 Age: 69 y.o. Gender: female  Primary Care Provider: [facility] Consultants: GI, Nephrology, Vascular Code Status: Full  Pt Overview and Major Events to Date:  02/23-admitted  Assessment and Plan: TAMIAH DYSART is a 69 y.o. female presenting with symptomatic anemia. 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    Altered mental status Pt last seen at baseline 30 mins prior to my assessment.  Per RN she was sitting at bedside and was able to get herself back in bed.  On exam patient lethargic and difficult to arouse.  Responded to to touch and loud voice.  She was oriented x3 but speech was mumbled.  Unable to keep eyes open during assessment. PERRL.  CBG 105, BP 127/110, HR 90 and RR 14 with oxygen saturations 98% on room air.  O2 sats now 88% on room air. Considered CVA but no focal deficit, CT head pending.  Also considered metabolic acidosis as patient had 7 bowel movements overnight due to bowel prep for colonoscopy scheduled for today.  Labs pending.  Decreased Hbg likely as pt being worked up for GI bleed and had similar presentation on admission. Labs pending.  Given that patient is diabetic and has been NPO overnight also considered hypoglycemia but less likely as CBG 105 at bedside. -Stat CBC and BMP -Stat chest xray-stable cardiomegaly and central pulmonary vascular congestion. -CT head- no acute intracranial findings -Continuous pulse ox monitoring -O2 2L n/c to maintain O2 sat> 92%, wean as appropriate   Acute on chronic symptomatic  Anemia, h/o GI bleed Normotensive overnight.  Completed bowel prep.  On exam patient lethargic and difficult to arouse. -GI following, colonoscopy cancelled given AMS -Completed bowel prep -N.p.o. for procedure -Transfusion threshold less than 8 -IV Protonix 40 mg twice daily -SCDs for DVT prophylaxis -Vital signs per unit -Up with assistance -PT/OT following -Labs pending -Repeat CBC in a.m.  ESRD on HD Chronic.  Stable.  Dialysis schedule Tuesdays, Thursdays, Saturdays.  Patient was dialyzed yesterday.  Right upper chest HD cath intact.  Revision of left cephalic vein AV fistula (02/21).  -Nephro following, appreciate recommendations -Strict I's and O -Daily weight -Monitor fluid status -BMP in a.m.  CAD s/p CABG CHF  H/O PAF/A.flutter Chronic.  Stable.  Meds include ASA, Plavix (on hold in setting of GI bleed) and pravastatin.  Nitroglycerin as needed.  Denies any chest pain or shortness of breath. -Continue to hold ASA and Plavix -Nitroglycerin as needed -Pravastatin 20 mg daily  HTN Soft blood pressures.  86/56 open (MAP 66) possible error as previous blood pressure 133/60. -Monitor blood pressure -Continue to hold Lopressor, restart when BP tolerates  PAD  s/p L great toe amputation Stable.  Aortogram with atherectomy of the left SFA and popliteal arteries as well as stenting of left popliteal artery (02/06/2020).  Amputation of left hallux for gangrene (02/07/2020) -Consult vascular for suture removal and dressing change, discuss options for anticoagulation as patient has stent and now needs to be off Plavix and ASA for GI bleed -Plavix and ASA held given GI bleed  DMT2 CBG 105 overnight.  Patient n.p.o. after midnight for procedure today.  Home medications NovoLog and Humalog -Continue to hold home medications -Continue sensitive sliding scale insulin coverage -Monitor CBGs -N.p.o. for procedure, resume diet when cleared by GI  Peripheral Neuropathy Chronic.   Stable.  Home medication gabapentin 300 mg nightly -Continue gabapentin  GERD Home medication Protonix p.o. -Start IV protonix   Asthma Home medications include ProAir, Atrovent, and Dulera. -Continue Dulera 2puffs q4h PRN wheezing, albuterol 2 puff q6h PRN wheezing  Polysubstance use Tobacco use.  Currently on Wellbutrin 150 twice daily.  Patient also endorsed occasional cocaine use (07/20) -Encourage smoking cessation -Courage abstinence of cocaine -NicoDerm patch if patient requests  MDD, GAD Stable. Continue home medication Wellbutrin 150 mg BID.   Gout Continue home meds: allopurinol 100 mg p.o. twice daily - consider dose adjustment outpatient given ESRD  Afib/flutter New onset noted at 09/2019 admission for GI bleed. EKG NSR on admission. Not anticoagulated due to h/o GI bleeds and frequent falls. CHADSVASc 8 with stroke risk >10%, HAS-BLED 5 suggesting alternative to anticoagulation.    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 RV with normal systolic function.   FEN/GI: NPO pending GI recommendations Prophylaxis: SCDs for DVT ppx   Disposition: ?[facility] SNF, Pending PT/OT, GI and nephro recomendations  Subjective:  No acute events overnight.  Patient lethargic this morning, difficult to arouse.  Opens eyes to loud voice and vigorous touch but unable to maintain eye contact.  Oriented to person place and time.  Speech mumbled. CBG 105, BP 127/110, HR 90 and RR 14, O2 sats 90% on room air.    Objective: Temp:  [98.2 F (36.8 C)-98.6 F (37 C)] 98.3 F (36.8 C) (02/25 1353) Pulse Rate:  [66-91] 66 (02/25 1353) Resp:  [18] 18 (02/25 1353) BP: (95-137)/(31-75) 110/57 (02/25 1353) SpO2:  [99 %-100 %] 99 % (02/25 1353) Weight:  [118.9 kg-120 kg] 118.9 kg (02/25 1353) Physical Exam: General: 69 year old female lethargic, unable to maintain eye contact Cardiovascular: Regular rate and rhythm, no gallops or murmurs,  distal pulses present bilaterally Respiratory: Scattered expiratory fine crackles with diminished at baseline given body habitus.  No increased work of breathing, RR 14. Abdomen: Obese, soft, nontender, non distended.  Bowel sounds present Extremities: No lower extremity edema.  Left lower foot remains wrapped in dressing s/p amputation left hallux  Laboratory: Recent Labs  Lab 02/21/20 1655 02/21/20 1655 02/22/20 0212 02/22/20 1955 02/23/20 0408  WBC 10.3  --  10.4  --  10.3  HGB 4.0*   < > 6.1* 7.7* 8.3*  HCT 14.1*   < > 19.6* 24.6* 26.0*  PLT 370  --  312  --  213   < > = values in this interval not displayed.   Recent Labs  Lab 02/21/20 1655 02/22/20 0212 02/23/20 0408  NA 136 139 136  K 4.5 4.1 3.6  CL 95* 98 96*  CO2 24 26 26   BUN 83* 84* 48*  CREATININE 7.67* 7.85* 5.21*  CALCIUM 7.6* 7.5* 8.2*  GLUCOSE 167* 119* 90      Imaging/Diagnostic Tests: No results found.  Carollee Leitz, MD 02/23/2020, 5:51 PM PGY-1, [place] Intern pager: [phone], text pages welcome

## 2020-02-23 NOTE — Anesthesia Preprocedure Evaluation (Deleted)
Anesthesia Evaluation  Patient identified by MRN, date of birth, ID band Patient unresponsive    Reviewed: Allergy & Precautions, NPO status , Patient's Chart, lab work & pertinent test results  Airway Mallampati: I  TM Distance: >3 FB Neck ROM: Full    Dental no notable dental hx. (+) Edentulous Upper, Edentulous Lower   Pulmonary Current Smoker,    Pulmonary exam normal breath sounds clear to auscultation       Cardiovascular hypertension, +CHF  Normal cardiovascular exam Rhythm:Regular Rate:Normal  Echo 4/20 Ef 45-50%   Neuro/Psych CVA negative psych ROS   GI/Hepatic hiatal hernia, GERD  ,(+)     substance abuse  ,   Endo/Other  diabetes, Type 2  Renal/GU ESRF and DialysisRenal diseaseT, Th S Dialysis     Musculoskeletal   Abdominal (+) + obese,   Peds  Hematology   Anesthesia Other Findings   Reproductive/Obstetrics                          Anesthesia Physical Anesthesia Plan  ASA: III  Anesthesia Plan: MAC   Post-op Pain Management:    Induction: Intravenous  PONV Risk Score and Plan: Treatment may vary due to age or medical condition  Airway Management Planned: Nasal Cannula and Natural Airway  Additional Equipment: None  Intra-op Plan:   Post-operative Plan:   Informed Consent: I have reviewed the patients History and Physical, chart, labs and discussed the procedure including the risks, benefits and alternatives for the proposed anesthesia with the patient or authorized representative who has indicated his/her understanding and acceptance.     Dental advisory given  Plan Discussed with:   Anesthesia Plan Comments: (Pt procedure cx due to mental status change )      Anesthesia Quick Evaluation

## 2020-02-23 NOTE — Progress Notes (Signed)
Family Medicine Teaching Service Daily Progress Note Intern Pager: [phone]  Patient name: [name] [name] Medical record number: [id number] Date of birth: 31-Oct-1951 Age: 69 y.o. Gender: female  Primary Care Provider: [facility] Consultants: GI, Nephrology, Vascular Code Status: Full  Pt Overview and Major Events to Date:  02/23-admitted  Assessment and Plan: [name] [name] is a 69 y.o. female presenting with symptomatic anemia. 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  Acute on chronic symptomatic Anemia, h/o GI bleed Normotensive overnight.  Hemoglobin 8.3 status post 5 units PRBCs. GI recommends colonoscopy and enteroscopy 02/26.  Bowel prep started yesterday.  Clear fluids today.  Patient denies any chest pain, shortness of breath, headache or dizziness. -GI following, plan for enteroscopy and colonoscopy 02/26 -MiraLAX given 02/24 -Clear fluids today -Bowel prep to start this evening. -N.p.o. after midnight -Transfusion threshold less than 8 -Continue IV Protonix 40 mg twice daily -PT/OT eval -Up with assistance -Vital signs per unit -SCDs for DVT prophylaxis -Repeat CBC in a.m.  ESRD on HD Chronic.  Stable.  Dialysis schedule Tuesdays, Thursdays, Saturdays.  Dialysis yesterday.  Patient has right upper chest HD cath.  Revision of left arm cephalic vein AV fistula (02/21).  On exam patient appears euvolemic. -Nephro following -Strict I's and O -Daily weight -Monitor fluid status -BMP in a.m.  CAD s/p CABG CHF  H/O PAF/A.flutter Chronic.  Stable.  Home medications include ASA, Plavix and pravastatin.  Nitroglycerin as needed.  Denies any chest pain or shortness of breath. -Continue  to hold aspirin and Plavix -Nitroglycerin as needed -Continue pravastatin 20 mg daily  HTN Chronic.  Stable.  Home medications include Lopressor -Continue to hold Lopressor, restart when pressure tolerates  PAD  s/p L great toe amputation Stable.  Aortogram with atherectomy of the left SFA and popliteal arteries as well as stenting of left popliteal artery (02/06/2020).  Amputation of left hallux for gangrene (02/07/2020) -Consult vascular for suture removal and dressing change, discuss options for anticoagulation as patient has stent and now needs to be off Plavix and ASA for GI bleed -Plavix and ASA held given GI bleed  DMT2 CBG 90.  Patient on clear liquids.  Last A1c 5 (01/21) Home medications include NovoLog and Humalog  -Hold home medication -Continue sensitive sliding scale insulin coverage -Clear fluids today -N.p.o. after midnight for procedure in a.m.  Peripheral Neuropathy Chronic.  Stable.  Home medication gabapentin 300 mg nightly -Continue gabapentin  GERD Home medication Protonix p.o. -Start IV protonix   Asthma Home medications include ProAir, Atrovent, and Dulera. -Continue Dulera 2puffs q4h PRN wheezing, albuterol 2 puff q6h PRN wheezing  Polysubstance use Tobacco use.  Currently on Wellbutrin 150 twice daily.  Patient also endorsed occasional cocaine use (07/20) -Encourage smoking cessation -Courage abstinence of cocaine -NicoDerm patch if patient requests  MDD, GAD Stable. Continue home medication Wellbutrin 150 mg BID.   Gout Continue home meds: allopurinol 100 mg p.o. twice daily - consider dose adjustment outpatient given ESRD  Afib/flutter New onset noted at 09/2019 admission for GI bleed. EKG NSR on admission. Not anticoagulated due to h/o GI bleeds and frequent falls. CHADSVASc 8 with stroke risk >10%, HAS-BLED 5 suggesting alternative to anticoagulation.    HFmrEF LVEF  45-50% on 03/2019 ECHO with severely increased LV wall thickness.  RV with normal systolic function.   FEN/GI: NPO pending GI recommendations Prophylaxis: SCDs for DVT ppx   Disposition: ?[facility] SN[facility], Pending PT/OT, GI and nephro recomendations  Subjective:  No acute events overnight.  MiraLAX given last night.  Bowel prep today patient understands importance of taking bowel prep to colonoscopy tomorrow.  She is in agreement with this.  In good spirits and feels like she is ready to go home.  Objective: Temp:  [97.4 F (36.3 C)-98.8 F (37.1 C)] 98.4 F (36.9 C) (02/25 0451) Pulse Rate:  [65-90] 73 (02/25 0525) Resp:  [13-22] 18 (02/25 0451) BP: (93-140)/(31-71) 117/61 (02/25 0525) SpO2:  [95 %-100 %] 100 % (02/25 0525) Weight:  [120 kg-121.7 kg] 120 kg (02/24 1620) Physical Exam: General: 69 year old female in no acute distress Cardiovascular: Regular rate and rhythm, no murmurs or gallops appreciated. Respiratory: Clear to auscultation bilaterally, no increased work of breathing Abdomen: Obese, soft nontender, nondistended bowel sounds are present Extremities: No lower extremity edema, left foot wrapped in dressing status post irritation left great toe  Laboratory: Recent Labs  Lab 02/21/20 1655 02/21/20 1655 02/22/20 0212 02/22/20 1955 02/23/20 0408  WBC 10.3  --  10.4  --  10.3  HGB 4.0*   < > 6.1* 7.7* 8.3*  HCT 14.1*   < > 19.6* 24.6* 26.0*  PLT 370  --  312  --  213   < > = values in this interval not displayed.   Recent Labs  Lab 02/21/20 1655 02/22/20 0212 02/23/20 0408  NA 136 139 136  K 4.5 4.1 3.6  CL 95* 98 96*  CO2 24 26 26   BUN 83* 84* 48*  CREATININE 7.67* 7.85* 5.21*  CALCIUM 7.6* 7.5* 8.2*  GLUCOSE 167* 119* 90      Imaging/Diagnostic Tests: No results found.  [name] Leitz, MD 02/23/2020, 6:13 AM PGY-1, [place] Intern pager: [phone], text pages welcome

## 2020-02-23 NOTE — Progress Notes (Signed)
Occupational Therapy Evaluation Patient Details Name: [name] [name] MRN: [id number] DOB: 12/13/1951 Today's Date: 02/23/2020    History of Present Illness [name] [name] is a 69 y.o. female with ESRD, CAD (Hx CABG), PVD (s/p L great toe amputation 02/07/20), T2DM, obesity, combined HF who is being admitted with symptomatic anemia.   Clinical Impression   PTA, pt lives with husband in second floor apartment. Pt was Modified Independent with ADLs and simple IADLs in the home using cane. Pt presents from SNF (short term rehab) after L toe amputation. Pt reports desire to return back to SNF for rehab prior to returning home. Pt Modified Independent for bed mobility to sit EOB, demonstrated good sitting balance while brushing hair sitting EOB and reaching to feet to adjust socks. Pt min guard for sit to stand and side stepping up to [facility] with RW, no dizziness or LOB. Recommend SNF for continuation of short term rehab prior to discharging home. Will continue to follow acutely.     Follow Up Recommendations  SNF    Equipment Recommendations  3 in 1 bedside commode(Rollator)    Recommendations for Other Services       Precautions / Restrictions Precautions Precautions: Fall Restrictions Weight Bearing Restrictions: No      Mobility Bed Mobility Overal bed mobility: Modified Independent             General bed mobility comments: Modifiied Independent to sit EOB, left sitting EOB as pt did not want to lie down yet  Transfers Overall transfer level: Needs assistance Equipment used: Rolling walker (2 wheeled) Transfers: Sit to/from Stand Sit to Stand: Min guard         General transfer comment: Pt able to stand without physical assistance and side step up to [facility] with RW with minimal signs of pain    Balance Overall balance assessment: Needs assistance Sitting-balance support: Feet supported Sitting balance-Leahy Scale: Good     Standing balance support: Bilateral upper  extremity supported;During functional activity Standing balance-Leahy Scale: Fair                             ADL either performed or assessed with clinical judgement   ADL Overall ADL's : Needs assistance/impaired Eating/Feeding: Set up;Sitting   Grooming: Brushing hair;Set up;Sitting Grooming Details (indicate cue type and reason): Sitting EOB, no LOB Upper Body Bathing: Set up;Sitting   Lower Body Bathing: Min guard;Sit to/from stand   Upper Body Dressing : Set up;Sitting   Lower Body Dressing: Minimal assistance;Sitting/lateral leans;Sit to/from stand Lower Body Dressing Details (indicate cue type and reason): Pt able to reach B feet sitting EOB without difficulty Toilet Transfer: Min guard;Stand-pivot   Toileting- Clothing Manipulation and Hygiene: Min guard;Sit to/from stand       Functional mobility during ADLs: Min guard;Rolling walker       Vision Baseline Vision/History: Wears glasses Wears Glasses: At all times Patient Visual Report: No change from baseline Vision Assessment?: No apparent visual deficits     Perception     Praxis      Pertinent Vitals/Pain Pain Assessment: Faces Faces Pain Scale: Hurts a little bit Pain Location: L foot Pain Descriptors / Indicators: Discomfort;Grimacing Pain Intervention(s): Monitored during session;Other (comment)(notified RN post session)     Hand Dominance Right   Extremity/Trunk Assessment Upper Extremity Assessment Upper Extremity Assessment: Generalized weakness   Lower Extremity Assessment Lower Extremity Assessment: Defer to PT evaluation  Communication Communication Communication: No difficulties   Cognition Arousal/Alertness: Awake/alert Behavior During Therapy: WFL for tasks assessed/performed Overall Cognitive Status: Within Functional Limits for tasks assessed                                     General Comments  HR <104 for duration of session. Pt denied any  dizziness    Exercises     Shoulder Instructions      Home Living Family/patient expects to be discharged to:: Private residence Living Arrangements: Spouse/significant other Available Help at Discharge: Available 24 hours/day;Family Type of Home: Apartment Home Access: Stairs to enter [employer] of Steps: 13 (6+7) Entrance Stairs-Rails: Can reach both Home Layout: One level     Bathroom Shower/Tub: Teacher, early years/pre: Standard Bathroom Accessibility: Yes How Accessible: Accessible via walker Home Equipment: Shower seat;Grab bars - tub/shower;Cane - quad          Prior Functioning/Environment Level of Independence: Needs assistance  Gait / Transfers Assistance Needed: ambulatory with quad cane, recent falls ADL's / Homemaking Assistance Needed: Assistance with IADLs, husband does grocery shopping. Pt reports cooking at home            OT Problem List: Decreased strength;Decreased knowledge of use of DME or AE;Decreased knowledge of precautions;Obesity;Decreased activity tolerance;Impaired balance (sitting and/or standing);Pain      OT Treatment/Interventions: Self-care/ADL training;Therapeutic exercise;Patient/family education;Balance training;Energy conservation;Therapeutic activities;DME and/or AE instruction    OT Goals(Current goals can be found in the care plan section) Acute Rehab OT Goals Patient Stated Goal: go back to rehab before going home OT Goal Formulation: With patient Time For Goal Achievement: 03/08/20 Potential to Achieve Goals: Good  OT Frequency: Min 2X/week   Barriers to D/C:            Co-evaluation              AM-PAC OT "6 Clicks" Daily Activity     Outcome Measure Help from another person eating meals?: None Help from another person taking care of personal grooming?: A Little Help from another person toileting, which includes using toliet, bedpan, or urinal?: A Little Help from another person  bathing (including washing, rinsing, drying)?: A Little Help from another person to put on and taking off regular upper body clothing?: A Little Help from another person to put on and taking off regular lower body clothing?: A Little 6 Click Score: 19   End of Session Equipment Utilized During Treatment: Rolling walker Nurse Communication: Mobility status(pt reporting desire for pain meds)  Activity Tolerance: Patient tolerated treatment well Patient left: in chair;with call bell/phone within reach;with chair alarm set  OT Visit Diagnosis: Unsteadiness on feet (R26.81);Other abnormalities of gait and mobility (R26.89);Muscle weakness (generalized) (M62.81);Pain Pain - Right/Left: Left Pain - part of body: Ankle and joints of foot                Time: 9381-0175 OT Time Calculation (min): 22 min Charges:  OT General Charges $OT Visit: 1 Visit OT Evaluation $OT Eval Moderate Complexity: 1 Mod  Layla Maw, OTR/L  Layla Maw 02/23/2020, 1:36 PM

## 2020-02-24 ENCOUNTER — Encounter (HOSPITAL_COMMUNITY)
Admission: EM | Disposition: A | Discharge status: Skilled Nursing Facility | Payer: Self-pay | Source: Skilled Nursing Facility | Attending: Family Medicine

## 2020-02-24 ENCOUNTER — Inpatient Hospital Stay (HOSPITAL_COMMUNITY): Payer: Medicare (Managed Care)

## 2020-02-24 DIAGNOSIS — R4182 Altered mental status, unspecified: Secondary | ICD-10-CM

## 2020-02-24 DIAGNOSIS — D62 Acute posthemorrhagic anemia: Secondary | ICD-10-CM

## 2020-02-24 LAB — RENAL FUNCTION PANEL
Albumin: 2.3 g/dL — ABNORMAL LOW (ref 3.5–5.0)
Anion gap: 13 (ref 5–15)
BUN: 27 mg/dL — ABNORMAL HIGH (ref 8–23)
CO2: 25 mmol/L (ref 22–32)
Calcium: 8.2 mg/dL — ABNORMAL LOW (ref 8.9–10.3)
Chloride: 105 mmol/L (ref 98–111)
Creatinine, Ser: 4.17 mg/dL — ABNORMAL HIGH (ref 0.44–1.00)
GFR calc Af Amer: 12 mL/min — ABNORMAL LOW (ref >60)
GFR calc non Af Amer: 10 mL/min — ABNORMAL LOW (ref >60)
Glucose, Bld: 85 mg/dL (ref 70–99)
Phosphorus: 4 mg/dL (ref 2.5–4.6)
Potassium: 3.7 mmol/L (ref 3.5–5.1)
Sodium: 143 mmol/L (ref 135–145)

## 2020-02-24 LAB — CBC
HCT: 26.5 % — ABNORMAL LOW (ref 36.0–46.0)
Hemoglobin: 8.1 g/dL — ABNORMAL LOW (ref 12.0–15.0)
MCH: 28.6 pg (ref 26.0–34.0)
MCHC: 30.6 g/dL (ref 30.0–36.0)
MCV: 93.6 fL (ref 80.0–100.0)
Platelets: 194 10*3/uL (ref 150–400)
RBC: 2.83 MIL/uL — ABNORMAL LOW (ref 3.87–5.11)
RDW: 18.1 % — ABNORMAL HIGH (ref 11.5–15.5)
WBC: 8.4 10*3/uL (ref 4.0–10.5)
nRBC: 1.2 % — ABNORMAL HIGH (ref 0.0–0.2)

## 2020-02-24 LAB — BASIC METABOLIC PANEL
Anion gap: 16 — ABNORMAL HIGH (ref 5–15)
BUN: 28 mg/dL — ABNORMAL HIGH (ref 8–23)
CO2: 21 mmol/L — ABNORMAL LOW (ref 22–32)
Calcium: 7.9 mg/dL — ABNORMAL LOW (ref 8.9–10.3)
Chloride: 103 mmol/L (ref 98–111)
Creatinine, Ser: 3.89 mg/dL — ABNORMAL HIGH (ref 0.44–1.00)
GFR calc Af Amer: 13 mL/min — ABNORMAL LOW (ref >60)
GFR calc non Af Amer: 11 mL/min — ABNORMAL LOW (ref >60)
Glucose, Bld: 96 mg/dL (ref 70–99)
Potassium: 3.9 mmol/L (ref 3.5–5.1)
Sodium: 140 mmol/L (ref 135–145)

## 2020-02-24 LAB — GLUCOSE, CAPILLARY
Glucose-Capillary: 103 mg/dL — ABNORMAL HIGH (ref 70–99)
Glucose-Capillary: 105 mg/dL — ABNORMAL HIGH (ref 70–99)
Glucose-Capillary: 83 mg/dL (ref 70–99)
Glucose-Capillary: 89 mg/dL (ref 70–99)
Glucose-Capillary: 81 mg/dL (ref 70–99)

## 2020-02-24 LAB — AMMONIA: Ammonia: 22 umol/L (ref 9–35)

## 2020-02-24 SURGERY — CANCELLED PROCEDURE
Anesthesia: Monitor Anesthesia Care

## 2020-02-24 MED ORDER — DEXTROSE 10 % IV SOLN: INTRAVENOUS | Status: AC

## 2020-02-24 SURGICAL SUPPLY — 22 items

## 2020-02-24 NOTE — Progress Notes (Signed)
Call from GI.  Colonoscopy cancelled today given patients altered mental status.     Went to assess patient. She was sitting on the side of bed.  Alert and orientated to person, place and time.  She reports that she was tired this morning and she is requesting to have colonoscopy done.  CBC pending.  I asked RN to contact GI after results of CBC to inform them of patients mentation and if can possibly schedule later today for colonoscopy.  If not will keep patient NPO until able to have procedure.  Carollee Leitz MD

## 2020-02-24 NOTE — Plan of Care (Signed)
  Problem: Activity: Goal: Risk for activity intolerance will decrease Outcome: Progressing   

## 2020-02-24 NOTE — Progress Notes (Signed)
[name] Progress Note   Subjective: Resting in bed with eyes closed. No C/Os. NPO for endoscopy today.   Objective Vitals:   02/24/20 0518 02/24/20 0630 02/24/20 0753 02/24/20 0952  BP: (!) 89/56 (!) 166/72 (!) 127/110 (!) 152/70  Pulse: 88 92 90 89  Resp: 16   (!) 21  Temp: 98.3 F (36.8 C)   98.3 F (36.8 C)  TempSrc: Oral   Oral  SpO2: 100%  98% 100%  Weight:      Height:       Physical Exam General: obese older female in NAD Heart: HS distant, S1.S2  SR on monitor Lungs: CTAB Abdomen: Obese, NT Extremities: trace-1+ LLE trace RLE edema Dialysis Access: TDC/Maturing L AVF + bruit    Additional Objective Labs: Basic Metabolic Panel: Recent Labs  Lab 02/22/20 0212 02/23/20 0408 02/24/20 0844  NA 139 136 140  K 4.1 3.6 3.9  CL 98 96* 103  CO2 26 26 21*  GLUCOSE 119* 90 96  BUN 84* 48* 28*  CREATININE 7.85* 5.21* 3.89*  CALCIUM 7.5* 8.2* 7.9*   Liver Function Tests: No results for input(s): AST, ALT, ALKPHOS, BILITOT, PROT, ALBUMIN in the last 168 hours. No results for input(s): LIPASE, AMYLASE in the last 168 hours. CBC: Recent Labs  Lab 02/21/20 1655 02/21/20 1655 02/22/20 0212 02/22/20 1955 02/23/20 0408  WBC 10.3  --  10.4  --  10.3  NEUTROABS 7.8*  --   --   --   --   HGB 4.0*   < > 6.1* 7.7* 8.3*  HCT 14.1*   < > 19.6* 24.6* 26.0*  MCV 94.0  --  91.2  --  89.7  PLT 370  --  312  --  213   < > = values in this interval not displayed.   Blood Culture    Component Value Date/Time   SDES BLOOD RIGHT ANTECUBITAL 01/31/2020 1158   SPECREQUEST  01/31/2020 1158    BOTTLES DRAWN AEROBIC AND ANAEROBIC Blood Culture results may not be optimal due to an inadequate volume of blood received in culture bottles   CULT  01/31/2020 1158    NO GROWTH 5 DAYS Performed at [facility], [place] [address], [place], [place] [address]    REPTSTATUS 02/05/2020 FINAL 01/31/2020 1158    Cardiac Enzymes: No results for input(s): CKTOTAL,  CKMB, CKMBINDEX, TROPONINI in the last 168 hours. CBG: Recent Labs  Lab 02/23/20 0649 02/23/20 1609 02/23/20 2046 02/24/20 0654 02/24/20 0752  GLUCAP 82 206* 105* 103* 105*   Iron Studies:  Recent Labs    02/21/20 1655  IRON 134  TIBC 242*  FERRITIN 123   @[email or web address] @ Studies/Results: DG CHEST PORT 1 VIEW  Result Date: 02/24/2020 CLINICAL DATA:  Altered mental status, hypoxia. EXAM: PORTABLE CHEST 1 VIEW COMPARISON:  February 21, 2020. FINDINGS: Stable cardiomegaly. Mild central pulmonary vascular congestion is noted. No pneumothorax or pleural effusion is noted. Right internal jugular dialysis catheter is unchanged in position. No consolidative process is noted. Bony thorax is unremarkable. IMPRESSION: Stable cardiomegaly and central pulmonary vascular congestion. Electronically Signed   By: Marijo Conception M.D.   On: 02/24/2020 09:05   Medications: . sodium chloride     . sodium chloride   Intravenous Once  . allopurinol  100 mg Oral Daily  . atorvastatin  40 mg Oral q1800  . buPROPion  150 mg Oral BID  . Chlorhexidine Gluconate Cloth  6 each Topical Daily  .  gabapentin  300 mg Oral QHS  . insulin aspart  0-5 Units Subcutaneous QHS  . insulin aspart  0-6 Units Subcutaneous TID WC  . latanoprost  1 drop Both Eyes QHS  . linaclotide  72 mcg Oral QAC breakfast  . multivitamin  1 tablet Oral QHS  . pantoprazole  40 mg Oral Q0600  . polyethylene glycol  17 g Oral Daily  . sucroferric oxyhydroxide  1,000 mg Oral TID WC     Dialysis Orders: TTS at [facility] --> last outpatient HD Sat 2/20 4:15hr, 400/800, EDW 118kg, 2K/3Ca, TDC + maturing AVF, no heparin - Micera 234mcg IV q 2 weeks (last 2/18 - Hgb 6/2 at time) - Calcitriol 56mcg PO q HD  Assessment/Plan: 1. Symptomatic anemia/GIB: Hgb 4 on admit with + FOBT. GI involved - plan for EGD/colonoscopy on 2/26. S/p 4U PRBCs this admit. Reports bloody stool overnight. 2. ESRD:Usual TTS sched - HD 02/27 on schedule.   3. Hypertension/volume: BP elevated with LE edema.  HD 02/25 Pre wt 119.5 Net UF only 371 cc Post 118.9. Volume needs to come down. Attempt 1.5-2 liter with HD tomorrow.  4. Anemiaof ESRD + ABLA: See above. Just given ESA as outpatient. Hgb 8.3 02/25. 5. Metabolic bone disease:Ca 8.2 C Ca 9.26. No recent PO4. Add RFP to today's labs.   6. CAD (Hx CABG) 7. PAD (s/p recent L great toe aputation) 8. T2DM  [name] [name] H. Bindu Docter NP-C 02/24/2020, 10:15 AM  [employer] [phone]

## 2020-02-24 NOTE — Progress Notes (Signed)
[facility] ROUNDING NOTE   Subjective: Acute change in mental status this morning.  CT head completed and negative for acute intracranial pathology, just chronic microvascular ischemic change.  BMP without significant electrolyte derangement.  Was transported down to endoscopy unit, but given significant change in mental status from baseline, procedure to be canceled.   Objective: Vital signs in last 24 hours: Temp:  [98 F (36.7 C)-98.6 F (37 C)] 98 F (36.7 C) (02/26 1052) Pulse Rate:  [66-92] 90 (02/26 1052) Resp:  [16-21] 16 (02/26 1052) BP: (89-166)/(46-110) 152/61 (02/26 1052) SpO2:  [98 %-100 %] 100 % (02/26 1052) Weight:  [118.9 kg-119.8 kg] 119.8 kg (02/26 1052) Last BM Date: 02/23/20 General: Arousable to voice and touch, but quickly falls back to sleep.  Not answering questions Lungs:  CTA b/l, no w/r/r Heart:  RRR, no m/r/g Abdomen:  Soft, NT, ND, +BS    Intake/Output from previous day: 02/25 0701 - 02/26 0700 In: 940 [P.O.:940] Out: 371  Intake/Output this shift: No intake/output data recorded.   Lab Results: Recent Labs    02/21/20 1655 02/21/20 1655 02/22/20 0212 02/22/20 1955 02/23/20 0408  WBC 10.3  --  10.4  --  10.3  HGB 4.0*   < > 6.1* 7.7* 8.3*  PLT 370  --  312  --  213  MCV 94.0  --  91.2  --  89.7   < > = values in this interval not displayed.   BMET Recent Labs    02/22/20 0212 02/23/20 0408 02/24/20 0844  NA 139 136 140  K 4.1 3.6 3.9  CL 98 96* 103  CO2 26 26 21*  GLUCOSE 119* 90 96  BUN 84* 48* 28*  CREATININE 7.85* 5.21* 3.89*  CALCIUM 7.5* 8.2* 7.9*   LFT No results for input(s): PROT, ALBUMIN, AST, ALT, ALKPHOS, BILITOT, BILIDIR, IBILI in the last 72 hours. PT/INR No results for input(s): INR in the last 72 hours.    Imaging/Other results: CT Head Wo Contrast  Result Date: 02/24/2020 CLINICAL DATA:  Altered mental status EXAM: CT HEAD WITHOUT CONTRAST TECHNIQUE: Contiguous axial images were obtained  from the base of the skull through the vertex without intravenous contrast. COMPARISON:  12/23/2017 FINDINGS: Brain: No evidence of acute infarction, hemorrhage, hydrocephalus, extra-axial collection or mass lesion/mass effect. 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 Extensive low-density changes within the periventricular and subcortical white matter compatible with chronic microvascular ischemic change. Mild-moderate diffuse cerebral volume loss. Vascular: Atherosclerotic calcifications involving the large vessels of the skull base. No unexpected hyperdense vessel. Skull: Normal. Negative for fracture or focal lesion. Sinuses/Orbits: No acute finding. Other: None. IMPRESSION: 1.  No acute intracranial findings. 2.  Chronic microvascular ischemic change and cerebral volume loss. Electronically Signed   By: Davina Poke D.O.   On: 02/24/2020 09:35   DG CHEST PORT 1 VIEW  Result Date: 02/24/2020 CLINICAL DATA:  Altered mental status, hypoxia. EXAM: PORTABLE CHEST 1 VIEW COMPARISON:  February 21, 2020. FINDINGS: Stable cardiomegaly. Mild central pulmonary vascular congestion is noted. No pneumothorax or pleural effusion is noted. Right internal jugular dialysis catheter is unchanged in position. No consolidative process is noted. Bony thorax is unremarkable. IMPRESSION: Stable cardiomegaly and central pulmonary vascular congestion. Electronically Signed   By: Marijo Conception M.D.   On: 02/24/2020 09:05      Assessment and Plan:  1) Acute on chronic anemia 2) FOBT positive stool 3) History of colonic AVMs -Admitted with hemoglobin  4, transfused 5 units PRBCs on this admission, with repeat hemoglobin 8.3 yesterday. -Had planned for push enteroscopy with colonoscopy today.  However, acute change in mental status this morning.  Procedures will be canceled due to elevated risks with sedation in this setting, and defer further evaluation as below -Continue serial  H/H checks.  CBC currently pending, with repeat transfusions per protocol -Remain n.p.o. given change in mental status -Will revisit optimal timing for endoscopic evaluation for diagnostic/therapeutic intent pending work-up for non-GI issues  4) Change in mental status -Acute change in mental status from baseline.  CT head unrevealing -BMP without significant electrolyte derangement -Defer additional work-up to primary service  5) ESRD -Hemodialysis TTS.  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  Last dose 02/21/2020    Lavena Bullion, DO  02/24/2020, 11:23 AM [facility] Pager [phone]

## 2020-02-24 NOTE — Plan of Care (Signed)
  Problem: Activity: Goal: Risk for activity intolerance will decrease Outcome: Progressing   Problem: Coping: Goal: Level of anxiety will decrease Outcome: Progressing   Problem: Pain Managment: Goal: General experience of comfort will improve Outcome: Progressing   

## 2020-02-24 NOTE — Progress Notes (Addendum)
PT Cancellation Note  Patient Details Name: [name] [name] MRN: [id number] DOB: 1951/09/03   Cancelled Treatment:    Reason Eval/Treat Not Completed: Patient at procedure or test/unavailable. Decreased alertness this AM, difficult to arouse. Pt now off floor for CT. Pt also scheduled for colonoscopy and enteroscopy today. PT to re-attempt as time allows.  Addendum 1116: Pt now off floor for GI procedures.   Lorriane Shire 02/24/2020, 9:33 AM   Lorrin Goodell, PT  Office # [phone] Pager [phone]

## 2020-02-24 NOTE — H&P (View-Only) (Signed)
[facility] ROUNDING NOTE   Subjective: Acute change in mental status this morning.  CT head completed and negative for acute intracranial pathology, just chronic microvascular ischemic change.  BMP without significant electrolyte derangement.  Was transported down to endoscopy unit, but given significant change in mental status from baseline, procedure to be canceled.   Objective: Vital signs in last 24 hours: Temp:  [98 F (36.7 C)-98.6 F (37 C)] 98 F (36.7 C) (02/26 1052) Pulse Rate:  [66-92] 90 (02/26 1052) Resp:  [16-21] 16 (02/26 1052) BP: (89-166)/(46-110) 152/61 (02/26 1052) SpO2:  [98 %-100 %] 100 % (02/26 1052) Weight:  [118.9 kg-119.8 kg] 119.8 kg (02/26 1052) Last BM Date: 02/23/20 General: Arousable to voice and touch, but quickly falls back to sleep.  Not answering questions Lungs:  CTA b/l, no w/r/r Heart:  RRR, no m/r/g Abdomen:  Soft, NT, ND, +BS    Intake/Output from previous day: 02/25 0701 - 02/26 0700 In: 940 [P.O.:940] Out: 371  Intake/Output this shift: No intake/output data recorded.   Lab Results: Recent Labs    02/21/20 1655 02/21/20 1655 02/22/20 0212 02/22/20 1955 02/23/20 0408  WBC 10.3  --  10.4  --  10.3  HGB 4.0*   < > 6.1* 7.7* 8.3*  PLT 370  --  312  --  213  MCV 94.0  --  91.2  --  89.7   < > = values in this interval not displayed.   BMET Recent Labs    02/22/20 0212 02/23/20 0408 02/24/20 0844  NA 139 136 140  K 4.1 3.6 3.9  CL 98 96* 103  CO2 26 26 21*  GLUCOSE 119* 90 96  BUN 84* 48* 28*  CREATININE 7.85* 5.21* 3.89*  CALCIUM 7.5* 8.2* 7.9*   LFT No results for input(s): PROT, ALBUMIN, AST, ALT, ALKPHOS, BILITOT, BILIDIR, IBILI in the last 72 hours. PT/INR No results for input(s): INR in the last 72 hours.    Imaging/Other results: CT Head Wo Contrast  Result Date: 02/24/2020 CLINICAL DATA:  Altered mental status EXAM: CT HEAD WITHOUT CONTRAST TECHNIQUE: Contiguous axial images were obtained  from the base of the skull through the vertex without intravenous contrast. COMPARISON:  12/23/2017 FINDINGS: Brain: No evidence of acute infarction, hemorrhage, hydrocephalus, extra-axial collection or mass lesion/mass effect. 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 Extensive low-density changes within the periventricular and subcortical white matter compatible with chronic microvascular ischemic change. Mild-moderate diffuse cerebral volume loss. Vascular: Atherosclerotic calcifications involving the large vessels of the skull base. No unexpected hyperdense vessel. Skull: Normal. Negative for fracture or focal lesion. Sinuses/Orbits: No acute finding. Other: None. IMPRESSION: 1.  No acute intracranial findings. 2.  Chronic microvascular ischemic change and cerebral volume loss. Electronically Signed   By: Davina Poke D.O.   On: 02/24/2020 09:35   DG CHEST PORT 1 VIEW  Result Date: 02/24/2020 CLINICAL DATA:  Altered mental status, hypoxia. EXAM: PORTABLE CHEST 1 VIEW COMPARISON:  February 21, 2020. FINDINGS: Stable cardiomegaly. Mild central pulmonary vascular congestion is noted. No pneumothorax or pleural effusion is noted. Right internal jugular dialysis catheter is unchanged in position. No consolidative process is noted. Bony thorax is unremarkable. IMPRESSION: Stable cardiomegaly and central pulmonary vascular congestion. Electronically Signed   By: Marijo Conception M.D.   On: 02/24/2020 09:05      Assessment and Plan:  1) Acute on chronic anemia 2) FOBT positive stool 3) History of colonic AVMs -Admitted with hemoglobin  4, transfused 5 units PRBCs on this admission, with repeat hemoglobin 8.3 yesterday. -Had planned for push enteroscopy with colonoscopy today.  However, acute change in mental status this morning.  Procedures will be canceled due to elevated risks with sedation in this setting, and defer further evaluation as below -Continue serial  H/H checks.  CBC currently pending, with repeat transfusions per protocol -Remain n.p.o. given change in mental status -Will revisit optimal timing for endoscopic evaluation for diagnostic/therapeutic intent pending work-up for non-GI issues  4) Change in mental status -Acute change in mental status from baseline.  CT head unrevealing -BMP without significant electrolyte derangement -Defer additional work-up to primary service  5) ESRD -Hemodialysis TTS.  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  Last dose 02/21/2020    Lavena Bullion, DO  02/24/2020, 11:23 AM [facility] Pager [phone]

## 2020-02-25 ENCOUNTER — Encounter (HOSPITAL_COMMUNITY)
Admission: EM | Disposition: A | Discharge status: Skilled Nursing Facility | Payer: Self-pay | Source: Skilled Nursing Facility | Attending: Family Medicine

## 2020-02-25 ENCOUNTER — Encounter (HOSPITAL_COMMUNITY): Payer: Self-pay | Admitting: Family Medicine

## 2020-02-25 ENCOUNTER — Inpatient Hospital Stay (HOSPITAL_COMMUNITY): Payer: Medicare (Managed Care) | Admitting: Certified Registered Nurse Anesthetist

## 2020-02-25 DIAGNOSIS — K635 Polyp of colon: Secondary | ICD-10-CM

## 2020-02-25 DIAGNOSIS — K5521 Angiodysplasia of colon with hemorrhage: Principal | ICD-10-CM

## 2020-02-25 DIAGNOSIS — K31819 Angiodysplasia of stomach and duodenum without bleeding: Secondary | ICD-10-CM

## 2020-02-25 DIAGNOSIS — K5520 Angiodysplasia of colon without hemorrhage: Secondary | ICD-10-CM

## 2020-02-25 HISTORY — PX: COLONOSCOPY: SHX5424

## 2020-02-25 HISTORY — PX: HEMOSTASIS CLIP PLACEMENT: SHX6857

## 2020-02-25 HISTORY — PX: HOT HEMOSTASIS: SHX5433

## 2020-02-25 HISTORY — PX: ENTEROSCOPY: SHX5533

## 2020-02-25 LAB — RENAL FUNCTION PANEL
Albumin: 2.3 g/dL — ABNORMAL LOW (ref 3.5–5.0)
Anion gap: 14 (ref 5–15)
BUN: 28 mg/dL — ABNORMAL HIGH (ref 8–23)
CO2: 25 mmol/L (ref 22–32)
Calcium: 8.3 mg/dL — ABNORMAL LOW (ref 8.9–10.3)
Chloride: 104 mmol/L (ref 98–111)
Creatinine, Ser: 4.47 mg/dL — ABNORMAL HIGH (ref 0.44–1.00)
GFR calc Af Amer: 11 mL/min — ABNORMAL LOW (ref >60)
GFR calc non Af Amer: 9 mL/min — ABNORMAL LOW (ref >60)
Glucose, Bld: 84 mg/dL (ref 70–99)
Phosphorus: 4.7 mg/dL — ABNORMAL HIGH (ref 2.5–4.6)
Potassium: 3.5 mmol/L (ref 3.5–5.1)
Sodium: 143 mmol/L (ref 135–145)

## 2020-02-25 LAB — CBC
HCT: 26.8 % — ABNORMAL LOW (ref 36.0–46.0)
Hemoglobin: 8 g/dL — ABNORMAL LOW (ref 12.0–15.0)
MCH: 28.5 pg (ref 26.0–34.0)
MCHC: 29.9 g/dL — ABNORMAL LOW (ref 30.0–36.0)
MCV: 95.4 fL (ref 80.0–100.0)
Platelets: 204 10*3/uL (ref 150–400)
RBC: 2.81 MIL/uL — ABNORMAL LOW (ref 3.87–5.11)
RDW: 18.1 % — ABNORMAL HIGH (ref 11.5–15.5)
WBC: 7.9 10*3/uL (ref 4.0–10.5)
nRBC: 1.3 % — ABNORMAL HIGH (ref 0.0–0.2)

## 2020-02-25 LAB — GLUCOSE, CAPILLARY
Glucose-Capillary: 77 mg/dL (ref 70–99)
Glucose-Capillary: 103 mg/dL — ABNORMAL HIGH (ref 70–99)
Glucose-Capillary: 190 mg/dL — ABNORMAL HIGH (ref 70–99)
Glucose-Capillary: 192 mg/dL — ABNORMAL HIGH (ref 70–99)

## 2020-02-25 SURGERY — ENTEROSCOPY
Anesthesia: Monitor Anesthesia Care

## 2020-02-25 MED ORDER — SODIUM CHLORIDE 0.9 % IV SOLN: INTRAVENOUS | Status: DC | PRN

## 2020-02-25 MED ORDER — HEPARIN SODIUM (PORCINE) 1000 UNIT/ML IJ SOLN
INTRAMUSCULAR | Status: AC
  Administered 2020-02-25: 3000 [IU]
  Filled 2020-02-25: qty 3

## 2020-02-25 MED ORDER — PROPOFOL 10 MG/ML IV BOLUS
INTRAVENOUS | Status: DC | PRN
  Administered 2020-02-25 (×3): 10 mg via INTRAVENOUS
  Administered 2020-02-25 (×2): 20 mg via INTRAVENOUS

## 2020-02-25 MED ORDER — SODIUM CHLORIDE 0.9 % IV SOLN: INTRAVENOUS | Status: DC

## 2020-02-25 MED ORDER — PHENYLEPHRINE 40 MCG/ML (10ML) SYRINGE FOR IV PUSH (FOR BLOOD PRESSURE SUPPORT)
PREFILLED_SYRINGE | INTRAVENOUS | Status: DC | PRN
  Administered 2020-02-25: 80 ug via INTRAVENOUS
  Administered 2020-02-25: 120 ug via INTRAVENOUS
  Administered 2020-02-25 (×2): 80 ug via INTRAVENOUS

## 2020-02-25 MED ORDER — PROPOFOL 500 MG/50ML IV EMUL
INTRAVENOUS | Status: DC | PRN
  Administered 2020-02-25: 100 ug/kg/min via INTRAVENOUS

## 2020-02-25 NOTE — Progress Notes (Signed)
Family Medicine Teaching Service Daily Progress Note Intern Pager: [phone]  Patient name: [name] [name] Medical record number: [id number] Date of birth: [birth year] Age: 69 y.o. Gender: female  Primary Care Provider: [facility] Consultants: GI, Nephrology, Vascular Code Status: Full  Pt Overview and Major Events to Date:  02/23-admitted  Assessment and Plan: [name] [name] is a 69 y.o. female presenting with symptomatic anemia. 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   Altered mental status Improved. Patient stated she was "just tired" yesterday. CT head non-con was negative and BMP showed no electrolyte abnormalities. Hbg was 8.1 which is around her baseline and is normal this am. Chest x-ray unremarkable, she had not had anything sedating in the past 24 hours. - Cont to monitor vitals per floor   Acute on chronic symptomatic Anemia, h/o GI bleed Normotensive overnight.  Completed bowel prep. H/H this am 8.0/26.8 -GI following, colonoscopy cancelled given AMS yesterday; will reach back out to them today to see if they will proceed today -Completed bowel prep -N.p.o. for procedure, getting colonoscopy today -Transfusion threshold less than 8 -Protonix 40 mg daily -SCDs for DVT prophylaxis -Up with assistance -PT/OT following  ESRD on HD Chronic.  Stable.  Dialysis schedule Tuesdays, Thursdays, Saturdays.  Patient was dialyzed yesterday.  Right upper chest HD cath intact.  Revision of left cephalic vein AV fistula (02/21).  -Nephro following, appreciate recommendations -Strict I's and O -Daily weight -Monitor fluid status -BMP in a.m.  CAD s/p CABG CHF  H/O PAF/A.flutter Chronic.  Stable.   Meds include ASA, Plavix (on hold in setting of GI bleed) and pravastatin.  Nitroglycerin as needed.  Denies any chest pain or shortness of breath. -Continue to hold ASA and Plavix due to concern for GI bleed -Nitroglycerin as needed -Pravastatin 20 mg daily  HTN Soft blood pressures.  94/77 open (MAP 83) SBP range of 94-152 and DBP 51-77 over past 24 hours. -Monitor blood pressure -Continue to hold Lopressor, restart when BP tolerates  PAD  s/p L great toe amputation Stable.  Aortogram with atherectomy of the left SFA and popliteal arteries as well as stenting of left popliteal artery (02/06/2020).  Amputation of left hallux for gangrene (02/07/2020) -Curbside for vascular: discontinue Plavix and ASA held given GI bleed; restart ASA 81mg  daily after colonoscopy  DMT2 CBG 103-77 overnight.  Patient was kept NPO from yesterday to get colonoscopy. Home medications NovoLog and Humalog -Continue to hold home medications -Continue sensitive sliding scale insulin coverage -Monitor CBGs -N.p.o. for procedure, resume diet when cleared by GI  Peripheral Neuropathy Chronic.  Stable.  Home medication gabapentin 300 mg nightly -Continue gabapentin  GERD Home medication Protonix p.o. -Start IV protonix   Asthma Home medications include ProAir, Atrovent, and Dulera. -Continue Dulera 2puffs q4h PRN wheezing, albuterol 2 puff q6h PRN wheezing  Polysubstance use Tobacco use.  Currently on Wellbutrin 150 twice daily.  Patient also endorsed occasional cocaine use (07/20) -Encourage smoking cessation -Courage abstinence of cocaine -NicoDerm patch if patient requests  MDD, GAD Stable. Continue home medication Wellbutrin 150 mg BID.   Gout Continue home meds: allopurinol 100 mg p.o. twice daily - consider dose adjustment outpatient given ESRD  Afib/flutter New onset noted at 09/2019 admission  for GI bleed. EKG NSR on admission. Not anticoagulated due to h/o GI bleeds and frequent  falls. CHADSVASc 8 with stroke risk >10%, HAS-BLED 5 suggesting alternative to anticoagulation.    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 RV with normal systolic function.   FEN/GI: NPO pending GI recommendations Prophylaxis: SCDs for DVT ppx   Disposition: ?[facility] SNF, Pending PT/OT, GI and nephro recomendations  Subjective:  Patient sitting up in bed with no complaints. States she is hungry and thirsty. She has no abdominal pain, no further bleeding.    Objective: Temp:  [98 F (36.7 C)-98.7 F (37.1 C)] 98.7 F (37.1 C) (02/27 0614) Pulse Rate:  [80-97] 97 (02/27 0614) Resp:  [16-21] 20 (02/27 0614) BP: (94-152)/(51-77) 94/77 (02/27 0614) SpO2:  [99 %-100 %] 99 % (02/27 0614) Weight:  [119.8 kg] 119.8 kg (02/27 0500) Physical Exam: Gen: Alert and Oriented x 3, NAD CV: RRR, systolic murmur, normal S1, S2 split Resp: CTAB, no wheezing, rales, or rhonchi, comfortable work of breathing Abd: non-distended, non-tender, soft, +bs in all four quadrants Extremities: No lower extremity edema.  Laboratory: Recent Labs  Lab 02/23/20 0408 02/24/20 1148 02/25/20 0443  WBC 10.3 8.4 7.9  HGB 8.3* 8.1* 8.0*  HCT 26.0* 26.5* 26.8*  PLT 213 194 204   Recent Labs  Lab 02/24/20 0844 02/24/20 1148 02/25/20 0443  NA 140 143 143  K 3.9 3.7 3.5  CL 103 105 104  CO2 21* 25 25  BUN 28* 27* 28*  CREATININE 3.89* 4.17* 4.47*  CALCIUM 7.9* 8.2* 8.3*  GLUCOSE 96 85 84      Imaging/Diagnostic Tests: CT Head Wo Contrast  Result Date: 02/24/2020 CLINICAL DATA:  Altered mental status EXAM: CT HEAD WITHOUT CONTRAST TECHNIQUE: Contiguous axial images were obtained from the base of the skull through the vertex without intravenous contrast. COMPARISON:  12/23/2017 FINDINGS: Brain: No evidence of acute infarction, hemorrhage, hydrocephalus, extra-axial collection or mass lesion/mass effect. Chronic encephalomalacia in the right occipital lobe  related to prior infarct. Extensive low-density changes within the periventricular and subcortical white matter compatible with chronic microvascular ischemic change. Mild-moderate diffuse cerebral volume loss. Vascular: Atherosclerotic calcifications involving the large vessels of the skull base. No unexpected hyperdense vessel. Skull: Normal. Negative for fracture or focal lesion. Sinuses/Orbits: No acute finding. Other: None. IMPRESSION: 1.  No acute intracranial findings. 2.  Chronic microvascular ischemic change and cerebral volume loss. Electronically Signed   By: Davina Poke D.O.   On: 02/24/2020 09:35   DG CHEST PORT 1 VIEW  Result Date: 02/24/2020 CLINICAL DATA:  Altered mental status, hypoxia. EXAM: PORTABLE CHEST 1 VIEW COMPARISON:  February 21, 2020. FINDINGS: Stable cardiomegaly. Mild central pulmonary vascular congestion is noted. No pneumothorax or pleural effusion is noted. Right internal jugular dialysis catheter is unchanged in position. No consolidative process is noted. Bony thorax is unremarkable. IMPRESSION: Stable cardiomegaly and central pulmonary vascular congestion. Electronically Signed   By: Marijo Conception M.D.   On: 02/24/2020 09:05    Nuala Alpha, DO 02/25/2020, 8:49 AM PGY-3, [place] Intern pager: [phone], text pages welcome

## 2020-02-25 NOTE — Op Note (Signed)
[facility] Patient Name: [name] [name] Procedure Date : 02/25/2020 MRN: [id number] Attending MD: Gerrit Heck , MD Date of Birth: 01/03/1951 CSN: [id number] Age: 69 Admit Type: Inpatient Procedure:                Colonoscopy Indications:              Heme positive stool, Acute post hemorrhagic anemia Providers:                Gerrit Heck, MD, Kary Kos, RN, Laverda Sorenson, Technician, Clearnce Sorrel, CRNA Referring MD:              Medicines:                Monitored Anesthesia Care Complications:            No immediate complications. Estimated Blood Loss:     Estimated blood loss was minimal. Procedure:                Pre-Anesthesia Assessment:                           - Prior to the procedure, a History and Physical                            was performed, and patient medications and                            allergies were reviewed. The patient's tolerance of                            previous anesthesia was also reviewed. The risks                            and benefits of the procedure and the sedation                            options and risks were discussed with the patient.                            All questions were answered, and informed consent                            was obtained. Prior Anticoagulants: The patient has                            taken Plavix (clopidogrel), last dose was 4 days                            prior to procedure. ASA Grade Assessment: III - A                            patient with severe systemic disease. After  reviewing the risks and benefits, the patient was                            deemed in satisfactory condition to undergo the                            procedure.                           After obtaining informed consent, the colonoscope                            was passed under direct vision. Throughout the                            procedure, the  patient's blood pressure, pulse, and                            oxygen saturations were monitored continuously. The                            PCF-H190DL ([id number] Olympus pediatric colonscope                            was introduced through the anus and advanced to the                            the cecum, identified by appendiceal orifice and                            ileocecal valve. The colonoscopy was performed                            without difficulty. The patient tolerated the                            procedure well. The quality of the bowel                            preparation was fair. The ileocecal valve,                            appendiceal orifice, and rectum were photographed. Scope In: 11:48:50 AM Scope Out: 45:40:98 PM Scope Withdrawal Time: 0 hours 8 minutes 16 seconds  Total Procedure Duration: 0 hours 17 minutes 25 seconds  Findings:      The perianal and digital rectal examinations were normal.      A single small angioectasia with active bleeding was found at the       hepatic flexure. For hemostasis, one hemostatic clip was successfully       placed (MR conditional). There was no bleeding at the end of the       procedure.      A 3 mm polyp was found in the hepatic flexure. The polyp was sessile.       This was not removed given  active bleeding in the same area.      Non-bleeding internal hemorrhoids were found during anoscopy. The       hemorrhoids were small.      A moderate amount of semi-liquid stool was found in the entire colon,       interfering with visualization. Lavage of the area was performed using       copious amounts of sterile water, resulting in clearance with fair       visualization.      Retroflexion in the rectum was not performed due to anatomy. Impression:               - Preparation of the colon was fair.                           - A single, actively bleeding colonic angioectasia                            successfully treated  with hemostatic clip x1.                           - One 3 mm polyp at the hepatic flexure.                           - Non-bleeding internal hemorrhoids.                           - Stool in the entire examined colon.                           - No specimens collected. Recommendation:           - Return patient to hospital ward for ongoing care.                           - Clear liquid diet now and can advance as                            tolerated to renal/cardiac diet.                           - Resume Plavix (clopidogrel) at prior dose in 5                            days.                           - Hemodialysis today as scheduled.                           - Serial H/H checks with blood products per                            protocol while inpatient.                           - GI service will follow peripherally at this time.  PLease do not hesitate to contact on-call GI with                            additional questions or concerns.                           - Repeat Hgb 7-10 days after discharge to ensure                            appropriate return to baseline.                           - Will arrange for outpatient follow-up with her                            primary Gastroenterologist. Procedure Code(s):        --- Professional ---                           [phone], Colonoscopy, flexible; with control of                            bleeding, any method Diagnosis Code(s):        --- Professional ---                           K64.8, Other hemorrhoids                           K55.21, Angiodysplasia of colon with hemorrhage                           K63.5, Polyp of colon                           R19.5, Other fecal abnormalities                           D62, Acute posthemorrhagic anemia CPT copyright 2019 American [employer] Association. All rights reserved. The codes documented in this report are preliminary and upon coder review may  be revised to  meet current compliance requirements. Gerrit Heck, MD 02/25/2020 12:24:05 PM Number of Addenda: 0

## 2020-02-25 NOTE — Op Note (Signed)
[facility] Patient Name: [name] [name] Procedure Date : 02/25/2020 MRN: [id number] Attending MD: Gerrit Heck , MD Date of Birth: January 10, 1951 CSN: [id number] Age: 69 Admit Type: Inpatient Procedure:                Small bowel enteroscopy Indications:              Acute post hemorrhagic anemia, Occult blood in stool Providers:                Gerrit Heck, MD, Kary Kos, RN, Laverda Sorenson, Technician, Clearnce Sorrel, CRNA Referring MD:              Medicines:                Monitored Anesthesia Care Complications:            No immediate complications. Estimated Blood Loss:     Estimated blood loss: none. Procedure:                Pre-Anesthesia Assessment:                           - Prior to the procedure, a History and Physical                            was performed, and patient medications and                            allergies were reviewed. The patient's tolerance of                            previous anesthesia was also reviewed. The risks                            and benefits of the procedure and the sedation                            options and risks were discussed with the patient.                            All questions were answered, and informed consent                            was obtained. Prior Anticoagulants: The patient has                            taken Plavix (clopidogrel), last dose was 4 days                            prior to procedure. ASA Grade Assessment: III - A                            patient with severe systemic disease. After  reviewing the risks and benefits, the patient was                            deemed in satisfactory condition to undergo the                            procedure.                           After obtaining informed consent, the endoscope was                            passed under direct vision. Throughout the                            procedure,  the patient's blood pressure, pulse, and                            oxygen saturations were monitored continuously. The                            PCF-H190DL ([id number]) Olympus pediatric colonscope                            was introduced through the mouth and advanced to                            the proximal jejunum. The small bowel enteroscopy                            was accomplished without difficulty. The patient                            tolerated the procedure well. Scope In: Scope Out: Findings:      A 5 cm hiatal hernia was present.      A non-obstructing Schatzki ring was found in the lower third of the       esophagus.      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 Coagulation for hemostasis using argon plasma was       successful. Estimated blood loss: none.      A single angioectasia with no bleeding was found in the third portion of       the duodenum. Coagulation for hemostasis using argon plasma was       successful. Estimated blood loss: none.      There was no evidence of significant pathology in the duodenal bulb, in       the first portion of the duodenum, in the second portion of the duodenum       and in the fourth portion of the duodenum.      There was no evidence of significant pathology in the proximal jejunum. Impression:               - 5 cm hiatal hernia.                           -  Non-obstructing Schatzki ring.                           - Gastric antral vascular ectasia without bleeding.                            Treated with argon plasma coagulation (APC).                           - A single non-bleeding angioectasia in the                            duodenum. Treated with argon plasma coagulation                            (APC).                           - Normal duodenal bulb, first portion of the                            duodenum, second portion of the duodenum and fourth                             portion of the duodenum.                           - The examined portion of the jejunum was normal.                           - No specimens collected. Recommendation:           - Perform a colonoscopy today.                           - Additional recommendations pending Colonoscopy                            findings. Procedure Code(s):        --- Professional ---                           [phone], Small intestinal endoscopy, enteroscopy                            beyond second portion of duodenum, not including                            ileum; with control of bleeding (eg, injection,                            bipolar cautery, unipolar cautery, laser, heater                            probe, stapler, plasma coagulator) Diagnosis Code(s):        --- Professional ---  K44.9, Diaphragmatic hernia without obstruction or                            gangrene                           K22.2, Esophageal obstruction                           K31.819, Angiodysplasia of stomach and duodenum                            without bleeding                           D62, Acute posthemorrhagic anemia                           R19.5, Other fecal abnormalities CPT copyright 2019 American Medical Association. All rights reserved. The codes documented in this report are preliminary and upon coder review may  be revised to meet current compliance requirements. Gerrit Heck, MD 02/25/2020 12:14:32 PM Number of Addenda: 0

## 2020-02-25 NOTE — Progress Notes (Addendum)
[facility] Progress Note   Subjective:  Seen in room - hard to wake initially, but once awake did answer questions appropriately. No CP/dyspnea. GI procedures cancelled yesterday - looks like rescheduled for today.  Objective Vitals:   02/24/20 1630 02/24/20 2108 02/25/20 0500 02/25/20 0614  BP: (!) 141/59 120/70  94/77  Pulse:  80  97  Resp: 20 16  20   Temp: 98.6 F (37 C) 98.3 F (36.8 C)  98.7 F (37.1 C)  TempSrc: Oral Oral  Oral  SpO2: 100% 100%  99%  Weight:   119.8 kg   Height:       Physical Exam General: Obese woman, NAD Heart: RRR; no murmur Lungs: CTAB Abdomen: soft, non-tender Extremities: 1+ BLE edema Dialysis Access: TDC + maturing L AVF  Additional Objective Labs: Basic Metabolic Panel: Recent Labs  Lab 02/24/20 0844 02/24/20 1148 02/25/20 0443  NA 140 143 143  K 3.9 3.7 3.5  CL 103 105 104  CO2 21* 25 25  GLUCOSE 96 85 84  BUN 28* 27* 28*  CREATININE 3.89* 4.17* 4.47*  CALCIUM 7.9* 8.2* 8.3*  PHOS  --  4.0 4.7*   Liver Function Tests: Recent Labs  Lab 02/24/20 1148 02/25/20 0443  ALBUMIN 2.3* 2.3*   CBC: Recent Labs  Lab 02/21/20 1655 02/21/20 1655 02/22/20 0212 02/22/20 1955 02/23/20 0408 02/24/20 1148 02/25/20 0443  WBC 10.3   < > 10.4  --  10.3 8.4 7.9  NEUTROABS 7.8*  --   --   --   --   --   --   HGB 4.0*   < > 6.1*   < > 8.3* 8.1* 8.0*  HCT 14.1*   < > 19.6*   < > 26.0* 26.5* 26.8*  MCV 94.0  --  91.2  --  89.7 93.6 95.4  PLT 370   < > 312  --  213 194 204   < > = values in this interval not displayed.   CBG: Recent Labs  Lab 02/24/20 0752 02/24/20 1156 02/24/20 1629 02/24/20 2106 02/25/20 0818  GLUCAP 105* 83 89 81 77   Studies/Results: CT Head Wo Contrast  Result Date: 02/24/2020 CLINICAL DATA:  Altered mental status EXAM: CT HEAD WITHOUT CONTRAST TECHNIQUE: Contiguous axial images were obtained from the base of the skull through the vertex without intravenous contrast. COMPARISON:   12/23/2017 FINDINGS: Brain: No evidence of acute infarction, hemorrhage, hydrocephalus, extra-axial collection or mass lesion/mass effect. 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 Extensive low-density changes within the periventricular and subcortical white matter compatible with chronic microvascular ischemic change. Mild-moderate diffuse cerebral volume loss. Vascular: Atherosclerotic calcifications involving the large vessels of the skull base. No unexpected hyperdense vessel. Skull: Normal. Negative for fracture or focal lesion. Sinuses/Orbits: No acute finding. Other: None. IMPRESSION: 1.  No acute intracranial findings. 2.  Chronic microvascular ischemic change and cerebral volume loss. Electronically Signed   By: Davina Poke D.O.   On: 02/24/2020 09:35   DG CHEST PORT 1 VIEW  Result Date: 02/24/2020 CLINICAL DATA:  Altered mental status, hypoxia. EXAM: PORTABLE CHEST 1 VIEW COMPARISON:  February 21, 2020. FINDINGS: Stable cardiomegaly. Mild central pulmonary vascular congestion is noted. No pneumothorax or pleural effusion is noted. Right internal jugular dialysis catheter is unchanged in position. No consolidative process is noted. Bony thorax is unremarkable. IMPRESSION: Stable cardiomegaly and central pulmonary vascular congestion. Electronically Signed   By: Marijo Conception M.D.   On:  02/24/2020 09:05   Medications: . sodium chloride    . dextrose 10 mL/hr at 02/24/20 1443   . sodium chloride   Intravenous Once  . allopurinol  100 mg Oral Daily  . atorvastatin  40 mg Oral q1800  . buPROPion  150 mg Oral BID  . Chlorhexidine Gluconate Cloth  6 each Topical Daily  . gabapentin  300 mg Oral QHS  . insulin aspart  0-5 Units Subcutaneous QHS  . insulin aspart  0-6 Units Subcutaneous TID WC  . latanoprost  1 drop Both Eyes QHS  . linaclotide  72 mcg Oral QAC breakfast  . multivitamin  1 tablet Oral QHS  . pantoprazole  40 mg Oral Q0600  .  polyethylene glycol  17 g Oral Daily  . sucroferric oxyhydroxide  1,000 mg Oral TID WC    Dialysis Orders: [facility] last[facility]patientHD Sat 2/20 4:15hr, 400/800, EDW 118kg, 2K/3Ca, TDC + maturing AVF, no heparin - Micera 243mcg IV q 2 weeks (last 2/18 - Hgb 6/2 at time) - Calcitriol 94mcg PO q HD  Assessment/Plan: 1. Symptomatic anemia/GIB: Hgb 4 on admit with + FOBT. GI involved - plan for EGD/colonoscopy on 2/27. S/p4U PRBCs this admit.  2. ESRD:Usual TTS sched -for HD today (after GI procedures). 3. Hypertension/volume: BP low/steady - increase UF goal for today as tolerated. 4. Anemiaof ESRD + ABLA: See above. Just given ESA as outpatient.Hgb 8.3 02/25. 5. Metabolic bone disease:Ca/Phos stable. Continue binders. 6. CAD (Hx CABG) 7. PAD (s/p recent L great toe aputation) 8. T2DM 9. Dispo - stable from renal standpoint for dc; GI procedures are pending  Veneta Penton, Hershal Coria 02/25/2020, 9:42 AM  [facility]  Pt seen, examined and agree w assess/plan as above with additions as indicated.  [facility] 02/25/2020, 7:06 PM

## 2020-02-25 NOTE — Plan of Care (Signed)
  Problem: Bowel/Gastric: Goal: Will show no signs and symptoms of gastrointestinal bleeding Outcome: Progressing   Problem: Clinical Measurements: Goal: Complications related to the disease process, condition or treatment will be avoided or minimized Outcome: Progressing   Problem: Education: Goal: Knowledge of General Education information will improve Description: Including pain rating scale, medication(s)/side effects and non-pharmacologic comfort measures Outcome: Progressing   Problem: Clinical Measurements: Goal: Ability to maintain clinical measurements within normal limits will improve Outcome: Progressing Goal: Will remain free from infection Outcome: Progressing

## 2020-02-25 NOTE — Transfer of Care (Signed)
Immediate Anesthesia Transfer of Care Note  Patient: [name] [name]  Procedure(s) Performed: ENTEROSCOPY (Left ) COLONOSCOPY (Left ) HOT HEMOSTASIS (ARGON PLASMA COAGULATION/BICAP) (N/A ) HEMOSTASIS CLIP PLACEMENT  Patient Location: PACU and Endoscopy Unit  Anesthesia Type:MAC  Level of Consciousness: drowsy  Airway & Oxygen Therapy: Patient Spontanous Breathing and Patient connected to nasal cannula oxygen  Post-op Assessment: Report given to RN and Post -op Vital signs reviewed and stable  Post vital signs: Reviewed and stable  Last Vitals:  Vitals Value Taken Time  BP 97/41 02/25/20 1215  Temp 36.2 C 02/25/20 1215  Pulse 62 02/25/20 1216  Resp 19 02/25/20 1216  SpO2 100 % 02/25/20 1216  Vitals shown include unvalidated device data.  Last Pain:  Vitals:   02/25/20 1215  TempSrc: Temporal  PainSc: 0-No pain      Patients Stated Pain Goal: 2 (56/38/93 [id number])  Complications: No apparent anesthesia complications

## 2020-02-25 NOTE — Anesthesia Postprocedure Evaluation (Signed)
Anesthesia Post Note  Patient: [name] [name]  Procedure(s) Performed: ENTEROSCOPY (Left ) COLONOSCOPY (Left ) HOT HEMOSTASIS (ARGON PLASMA COAGULATION/BICAP) (N/A ) HEMOSTASIS CLIP PLACEMENT     Patient location during evaluation: PACU Anesthesia Type: MAC Level of consciousness: awake and alert Pain management: pain level controlled Vital Signs Assessment: post-procedure vital signs reviewed and stable Respiratory status: spontaneous breathing, nonlabored ventilation, respiratory function stable and patient connected to nasal cannula oxygen Cardiovascular status: stable and blood pressure returned to baseline Postop Assessment: no apparent nausea or vomiting Anesthetic complications: no    Last Vitals:  Vitals:   02/25/20 1234 02/25/20 1300  BP: 112/65 (!) 121/50  Pulse: (!) 53 69  Resp: 14   Temp:  36.6 C  SpO2: 100% (!) 76%    Last Pain:  Vitals:   02/25/20 1300  TempSrc: Oral  PainSc:                  [name]

## 2020-02-25 NOTE — Anesthesia Preprocedure Evaluation (Addendum)
Anesthesia Evaluation  Patient identified by MRN, date of birth, ID band Patient awake    Reviewed: Allergy & Precautions, NPO status , Patient's Chart, lab work & pertinent test results  Airway Mallampati: III  TM Distance: >3 FB Neck ROM: Full    Dental  (+) Teeth Intact   Pulmonary Current Smoker and Patient abstained from smoking.,    breath sounds clear to auscultation       Cardiovascular hypertension, + CAD, + Cardiac Stents, + CABG, + Peripheral Vascular Disease and +CHF   Rhythm:Regular Rate:Normal     Neuro/Psych PSYCHIATRIC DISORDERS Depression CVA    GI/Hepatic hiatal hernia, GERD  ,  Endo/Other  diabetes  Renal/GU ESRF and DialysisRenal disease     Musculoskeletal  (+) Arthritis ,   Abdominal (+) + obese,   Peds  Hematology   Anesthesia Other Findings   Reproductive/Obstetrics                            Lab Results  Component Value Date   WBC 7.9 02/25/2020   HGB 8.0 (L) 02/25/2020   HCT 26.8 (L) 02/25/2020   MCV 95.4 02/25/2020   PLT 204 02/25/2020   Lab Results  Component Value Date   CREATININE 4.47 (H) 02/25/2020   BUN 28 (H) 02/25/2020   NA 143 02/25/2020   K 3.5 02/25/2020   CL 104 02/25/2020   CO2 25 02/25/2020   Echo:  1. The left ventricle has mildly reduced systolic function, with an  ejection fraction of 45-50%. The cavity size was normal. There is severely  increased left ventricular wall thickness. Left ventricular diastolic  Doppler parameters are consistent with  pseudonormalization.  2. The right ventricle has normal systolic function. The cavity was  normal. There is no increase in right ventricular wall thickness.  3. Left atrial size was mildly dilated.  4. Mild thickening of the mitral valve leaflet.  5. No stenosis of the aortic valve.  6. The interatrial septum was not assessed.   Anesthesia Physical Anesthesia Plan  ASA:  III  Anesthesia Plan: MAC   Post-op Pain Management:    Induction: Intravenous  PONV Risk Score and Plan: 0  Airway Management Planned: Natural Airway and Nasal Cannula  Additional Equipment: None  Intra-op Plan:   Post-operative Plan:   Informed Consent: I have reviewed the patients History and Physical, chart, labs and discussed the procedure including the risks, benefits and alternatives for the proposed anesthesia with the patient or authorized representative who has indicated his/her understanding and acceptance.       Plan Discussed with: CRNA  Anesthesia Plan Comments:         Anesthesia Quick Evaluation

## 2020-02-25 NOTE — Interval H&P Note (Signed)
History and Physical Interval Note:  02/25/2020 11:08 AM  [name]  has presented today for surgery, with the diagnosis of Anemia.  The various methods of treatment have been discussed with the patient and family. After consideration of risks, benefits and other options for treatment, the patient has consented to  Procedure(s): ENTEROSCOPY (Left) COLONOSCOPY (Left) as a surgical intervention.  The patient's history has been reviewed, patient examined, no change in status, stable for surgery.  I have reviewed the patient's chart and labs.  Questions were answered to the patient's satisfaction.     [name] [name]

## 2020-02-25 NOTE — Progress Notes (Signed)
CSW went to patients room to initiate assessment. Patient was not present, CSW will follow-up.  Criss Alvine, Beatrice.

## 2020-02-25 NOTE — Evaluation (Signed)
Physical Therapy Evaluation Patient Details Name: [name] [name] MRN: [id number] DOB: 03-28-1951 Today's Date: 02/25/2020   History of Present Illness  [name] [name] is a 69 y.o. female with ESRD, CAD (Hx CABG), PVD (s/p L great toe amputation 02/07/20), T2DM, obesity, combined HF who is being admitted with symptomatic anemia. JOA:[id number], stroke, obesity, HTN, anemia, Patient was at a SNF following her amputation.   Clinical Impression  Patient presents with decreased endurance with gait. She has 13 steps to get into her apartment at home. She was at rehab working towards the goal of being able to get into her apartment safely. She is motivated to return to rehab and continue to work on that goal. Skilled acuter therapy will continue to work with her while she is admitted to increase leg strength and endurance.      Follow Up Recommendations SNF    Equipment Recommendations  Rolling walker with 5" wheels(says she is getting one )    Recommendations for Other Services Rehab consult     Precautions / Restrictions Precautions Precautions: Fall Restrictions Weight Bearing Restrictions: No      Mobility  Bed Mobility Overal bed mobility: Modified Independent             General bed mobility comments: Patient able to sit up at the edge of the bed without assitance. No syncope upon sitting up   Transfers Overall transfer level: Needs assistance Equipment used: Rolling walker (2 wheeled) Transfers: Sit to/from Stand Sit to Stand: Min guard         General transfer comment: min guard for inital balance; had to use walker to push up. Cuing not to put both hands on the walker   Ambulation/Gait Ambulation/Gait assistance: Min guard Gait Distance (Feet): 10 Feet Assistive device: Rolling walker (2 wheeled) Gait Pattern/deviations: Step-through pattern;Decreased stride length;Wide base of support;Trunk flexed;Antalgic;Decreased weight shift to left;Decreased stance time -  left;Decreased step length - right Gait velocity: decreased   General Gait Details: Patient short opf breath with minimal gait distance. No LOB or syncope but gaurding for gait   Stairs            Wheelchair Mobility    Modified Rankin (Stroke Patients Only)       Balance Overall balance assessment: Needs assistance Sitting-balance support: Feet supported Sitting balance-Leahy Scale: Good     Standing balance support: Bilateral upper extremity supported;During functional activity Standing balance-Leahy Scale: Fair Standing balance comment: reliant on external support                             Pertinent Vitals/Pain Pain Assessment: Faces Faces Pain Scale: Hurts a little bit Pain Location: L foot Pain Descriptors / Indicators: Discomfort;Aching Pain Intervention(s): Limited activity within patient's tolerance;Repositioned;Monitored during session    Home Living Family/patient expects to be discharged to:: Skilled nursing facility Living Arrangements: Spouse/significant other Available Help at Discharge: Available 24 hours/day;Family Type of Home: Apartment Home Access: Stairs to enter Entrance Stairs-Rails: Can reach both Entrance Stairs-Number of Steps: 13 (6+7) Home Layout: One level Home Equipment: Shower seat;Grab bars - tub/shower;Cane - quad Additional Comments: 14 steps to enter home    Prior Function Level of Independence: Needs assistance   Gait / Transfers Assistance Needed: ambulatory with quad cane, recent falls; using a walker at her nursing home  ADL's / Homemaking Assistance Needed: Assistance with IADLs, husband does grocery shopping. Pt reports cooking at home  Hand Dominance   Dominant Hand: Right    Extremity/Trunk Assessment   Upper Extremity Assessment Upper Extremity Assessment: Defer to OT evaluation    Lower Extremity Assessment Lower Extremity Assessment: Generalized weakness    Cervical / Trunk  Assessment Cervical / Trunk Assessment: Normal  Communication   Communication: No difficulties  Cognition Arousal/Alertness: Awake/alert Behavior During Therapy: WFL for tasks assessed/performed Overall Cognitive Status: Within Functional Limits for tasks assessed                                        General Comments      Exercises     Assessment/Plan    PT Assessment Patient needs continued PT services  PT Problem List Decreased strength;Decreased activity tolerance;Decreased balance;Decreased mobility;Pain       PT Treatment Interventions DME instruction;Gait training;Therapeutic activities;Functional mobility training;Stair training;Therapeutic exercise;Balance training;Patient/family education    PT Goals (Current goals can be found in the Care Plan section)  Acute Rehab PT Goals Patient Stated Goal: go back to rehab before going home PT Goal Formulation: With patient Time For Goal Achievement: 02/15/20 Potential to Achieve Goals: Good    Frequency Min 2X/week   Barriers to discharge        Co-evaluation               AM-PAC PT "6 Clicks" Mobility  Outcome Measure Help needed turning from your back to your side while in a flat bed without using bedrails?: None Help needed moving from lying on your back to sitting on the side of a flat bed without using bedrails?: A Little Help needed moving to and from a bed to a chair (including a wheelchair)?: A Little Help needed standing up from a chair using your arms (e.g., wheelchair or bedside chair)?: A Lot Help needed to walk in hospital room?: A Lot Help needed climbing 3-5 steps with a railing? : Total 6 Click Score: 15    End of Session Equipment Utilized During Treatment: Gait belt Activity Tolerance: Patient limited by pain;Patient limited by fatigue Patient left: with call bell/phone within reach;in chair Nurse Communication: Mobility status PT Visit Diagnosis: Unsteadiness on feet  (R26.81);Pain;Difficulty in walking, not elsewhere classified (R26.2) Pain - Right/Left: Left Pain - part of body: Ankle and joints of foot    Time: 2119-4174 PT Time Calculation (min) (ACUTE ONLY): 23 min   Charges:   PT Evaluation $PT Eval Moderate Complexity: 1 Mod           Carney Living PT DPT  02/25/2020, 10:31 AM

## 2020-02-25 NOTE — Progress Notes (Signed)
Patient is back from Endo, VS within normal range, no c/o pain at this time. Will continue to monitor patient.

## 2020-02-25 NOTE — Progress Notes (Signed)
Patient back from dialysis, VS within normal range no c/o pain at this time. Will continue to monitor patient.

## 2020-02-26 LAB — RENAL FUNCTION PANEL
Albumin: 2.3 g/dL — ABNORMAL LOW (ref 3.5–5.0)
Anion gap: 10 (ref 5–15)
BUN: 19 mg/dL (ref 8–23)
CO2: 28 mmol/L (ref 22–32)
Calcium: 8.3 mg/dL — ABNORMAL LOW (ref 8.9–10.3)
Chloride: 102 mmol/L (ref 98–111)
Creatinine, Ser: 3.57 mg/dL — ABNORMAL HIGH (ref 0.44–1.00)
GFR calc Af Amer: 14 mL/min — ABNORMAL LOW (ref >60)
GFR calc non Af Amer: 12 mL/min — ABNORMAL LOW (ref >60)
Glucose, Bld: 164 mg/dL — ABNORMAL HIGH (ref 70–99)
Phosphorus: 3.8 mg/dL (ref 2.5–4.6)
Potassium: 3.8 mmol/L (ref 3.5–5.1)
Sodium: 140 mmol/L (ref 135–145)

## 2020-02-26 LAB — CBC
HCT: 28.5 % — ABNORMAL LOW (ref 36.0–46.0)
Hemoglobin: 8.2 g/dL — ABNORMAL LOW (ref 12.0–15.0)
MCH: 28.1 pg (ref 26.0–34.0)
MCHC: 28.8 g/dL — ABNORMAL LOW (ref 30.0–36.0)
MCV: 97.6 fL (ref 80.0–100.0)
Platelets: 181 10*3/uL (ref 150–400)
RBC: 2.92 MIL/uL — ABNORMAL LOW (ref 3.87–5.11)
RDW: 17.8 % — ABNORMAL HIGH (ref 11.5–15.5)
WBC: 9.1 10*3/uL (ref 4.0–10.5)
nRBC: 0.8 % — ABNORMAL HIGH (ref 0.0–0.2)

## 2020-02-26 LAB — SARS CORONAVIRUS 2 (TAT 6-24 HRS): SARS Coronavirus 2: NEGATIVE

## 2020-02-26 LAB — GLUCOSE, CAPILLARY
Glucose-Capillary: 167 mg/dL — ABNORMAL HIGH (ref 70–99)
Glucose-Capillary: 142 mg/dL — ABNORMAL HIGH (ref 70–99)
Glucose-Capillary: 141 mg/dL — ABNORMAL HIGH (ref 70–99)
Glucose-Capillary: 113 mg/dL — ABNORMAL HIGH (ref 70–99)

## 2020-02-26 MED ORDER — ASPIRIN EC 81 MG PO TBEC
81.0000 mg | DELAYED_RELEASE_TABLET | Freq: Every day | ORAL | Status: DC
  Administered 2020-02-27 – 2020-02-28 (×2): 81 mg via ORAL
  Filled 2020-02-26 (×2): qty 1

## 2020-02-26 NOTE — Progress Notes (Signed)
[facility] Progress Note   Subjective:  Seen in room - happy to be eating again, munching on pork rinds -> we did discuss need to continue low Na diet. Denies CP or dyspnea. S/p EGD yesterday.  She is asking about when sutures will be removed on her L toe (s/p amputation) - dressing has not been changed while here. Bandage removed -- no odor/warmth, but incision site not healed and with some areas of black - ?scabbing v. necrotic tissue.   Objective Vitals:   02/26/20 0232 02/26/20 0500 02/26/20 0615 02/26/20 0914  BP: (!) 106/54  (!) 110/96 133/67  Pulse: 69  81 62  Resp: 18  18 18   Temp:   98 F (36.7 C) 98.8 F (37.1 C)  TempSrc:    Oral  SpO2: 98%  99% 100%  Weight:  117.4 kg    Height:       Physical Exam General: Obese woman, NAD Heart: RRR; no murmur Lungs: CTAB Abdomen: soft, non-tender Extremities: 1+ BLE edema. L toe unwrapped - sutures intact but incision not fully together - some areas of black at top of wound, ?scabbing v. necrosis. No warmth or odor. Dialysis Access: [facility] + maturing L AVF  Additional Objective Labs: Basic Metabolic Panel: Recent Labs  Lab 02/24/20 1148 02/25/20 0443 02/26/20 0715  NA 143 143 140  K 3.7 3.5 3.8  CL 105 104 102  CO2 25 25 28   GLUCOSE 85 84 164*  BUN 27* 28* 19  CREATININE 4.17* 4.47* 3.57*  CALCIUM 8.2* 8.3* 8.3*  PHOS 4.0 4.7* 3.8   Liver Function Tests: Recent Labs  Lab 02/24/20 1148 02/25/20 0443 02/26/20 0715  ALBUMIN 2.3* 2.3* 2.3*   CBC: Recent Labs  Lab 02/21/20 1655 02/21/20 1655 02/22/20 0212 02/22/20 1955 02/23/20 0408 02/23/20 0408 02/24/20 1148 02/25/20 0443 02/26/20 0715  WBC 10.3   < > 10.4  --  10.3   < > 8.4 7.9 9.1  NEUTROABS 7.8*  --   --   --   --   --   --   --   --   HGB 4.0*   < > 6.1*   < > 8.3*   < > 8.1* 8.0* 8.2*  HCT 14.1*   < > 19.6*   < > 26.0*   < > 26.5* 26.8* 28.5*  MCV 94.0   < > 91.2  --  89.7  --  93.6 95.4 97.6  PLT 370   < > 312  --  213   < > 194  204 181   < > = values in this interval not displayed.   CBG: Recent Labs  Lab 02/25/20 0818 02/25/20 1305 02/25/20 1822 02/25/20 2036 02/26/20 0656  GLUCAP 77 103* 190* 192* 167*   Medications:  . sodium chloride   Intravenous Once  . allopurinol  100 mg Oral Daily  . atorvastatin  40 mg Oral q1800  . buPROPion  150 mg Oral BID  . Chlorhexidine Gluconate Cloth  6 each Topical Daily  . gabapentin  300 mg Oral QHS  . insulin aspart  0-5 Units Subcutaneous QHS  . insulin aspart  0-6 Units Subcutaneous TID WC  . latanoprost  1 drop Both Eyes QHS  . linaclotide  72 mcg Oral QAC breakfast  . multivitamin  1 tablet Oral QHS  . pantoprazole  40 mg Oral Q0600  . polyethylene glycol  17 g Oral Daily  . sucroferric oxyhydroxide  1,000 mg Oral TID  WC    Dialysis Orders: Dialysis Orders: TTS at [facility] --> lastoutpatientHD Sat 2/20 4:15hr, 400/800, EDW 118kg, 2K/3Ca, TDC + maturing AVF, no heparin - Micera 268mcg IV q 2 weeks (last 2/18 - Hgb 6/2 at time) - Calcitriol 31mcg PO q HD  Assessment/Plan: 1. Symptomatic anemia/GIB: Hgb 4 on admit with + FOBT. S/p4U PRBCs this admit. Underwent EGD/colonoscopy 2/27 showed duodenal AVM, GAVE, and actively bleeding hepatic flexure AVM - all treated with clip and/or APC. 2. ESRD:Usual TTS sched -next HD 3/2 if still inpatient. 3. Hypertension/volume: BP low/steady - below prior EDW but with edema, will continue to lwoer dry weight as tolerated. 4. Anemiaof ESRD + ABLA: See above. Just given ESA as outpatient.Hgb 8.2 - stable. 5. Metabolic bone disease:Ca/Phos stable. Continue binders. 6. CAD (Hx CABG) 7. PAD (s/p recent L great toe aputation): Wound examined today (see above) -> will have hospitalist look at it to decide if needs further intervention here. 8. T2DM  Veneta Penton, PA-C 02/26/2020, 9:58 AM  [employer]

## 2020-02-26 NOTE — Consult Note (Signed)
Hospital Consult    Reason for Consult:  Non healing left great toe Referring Physician:  Dr. McDiarmid MRN #:  [id number]  History of Present Illness: This is a 69 y.o. female with esrd recently underwent revision of left arm avf and left great toe amputation. She is now admitted with GI bleed.  She was previously on aspirin and Plavix for recent stenting.  She is now on single agent baby aspirin only.  Past Medical History:  Diagnosis Date  . Abscess   . Acute blood loss anemia 08/17/2019  . Acute pulmonary edema (HCC)   . Acute respiratory failure ([place]) 10/18/2014  . Acute respiratory failure with hypoxia and hypercapnia ([place]) 06/01/2019  . AMS (altered mental status) 01/01/2020  . Anemia 08/2016  . Angiodysplasia of colon   . Arthritis of left shoulder region 03/23/2013  . Bleeding gastrointestinal   . Cardiomegaly 05/2019  . Chest pain 04/17/2016  . Chronic combined systolic and diastolic CHF (congestive heart failure) (HCC)    a. EF 40-45%, mild LVH, mid apicalanteroseptal and apical HK.  . CKD (chronic kidney disease), stage III   . Cocaine abuse ([place])    crack cocaine heavily until 2008 then sporadic use since then  . Coronary artery disease    a. 06/2012 NSTEMI/CABG x 3 (LIMA->LAD, VG->OM2, VG->LCX);  b. 04/2015 MV: EF<30%, mid ant, apicalanterior, apical infarct;  c. 04/2015 Cath: LM nl, LAD 90p, LCX 50m, OM1 min irregs, RCA mild dzs, LIMA->LAD nl w/ dist LAD dzs, VG->OM2 nl, VG->LCX nl-->Med Rx.  . CVA (cerebral infarction)    a. right internal capsule stroke in 12/2006  . Demand ischemia ([place])   . Diabetes mellitus    diagnosed in 2008  . Elevated troponin 04/27/2019  . Essential hypertension   . Glaucoma   . Gout   . Heme positive stool   . HFrEF (heart failure with reduced ejection fraction) ([place])   . Hyperlipidemia   . Hyperparathyroidism, secondary renal ([place])   . Hypertensive crisis 06/02/2019  . Left-sided sensory deficit present   . Lobar pneumonia ([place])  04/27/2019  . Obesity, morbid ([place])   . Pneumonia   . Pulmonary edema 05/2019  . PVD (peripheral vascular disease) ([place])    a. 06/2012 ABI's: R - 0.73, L - 0.71.  Marland Kitchen Renal mass, right   . Sepsis ([place]) 04/27/2019  . Shortness of breath dyspnea   . Stroke ([place])   . Thrombocytosis ([place]) 04/17/2016  . Thyroid nodule    FNA in 7782 showed follicular cells but not definate neoplasm  . Tobacco abuse   . Trichomoniasis     Past Surgical History:  Procedure Laterality Date  . ABDOMINAL AORTOGRAM W/LOWER EXTREMITY Bilateral 02/06/2020   Procedure: ABDOMINAL AORTOGRAM W/LOWER EXTREMITY;  Surgeon: Waynetta Sandy, MD;  Location: [place] [facility];  Service: Cardiovascular;  Laterality: Bilateral;  . AMPUTATION Left 02/07/2020   Procedure: AMPUTATION DIGIT - LEFT GREAT TOE;  Surgeon: Waynetta Sandy, MD;  Location: [place];  Service: Vascular;  Laterality: Left;  . AV FISTULA PLACEMENT Left 08/25/2019   Procedure: ARTERIOVENOUS (AV) BRACHIOCEPHALIC FISTULA CREATION;  Surgeon: Waynetta Sandy, MD;  Location: [place];  Service: Vascular;  Laterality: Left;  . CARDIAC CATHETERIZATION    . CARDIAC CATHETERIZATION N/A 05/17/2015   Procedure: Left Heart Cath and Cors/Grafts Angiography;  Surgeon: [name], MD;  Location: [place] [facility];  Service: Cardiovascular;  Laterality: N/A;  . COLONOSCOPY Left 02/25/2020   Procedure: COLONOSCOPY;  Surgeon:  Cirigliano, Dominic Pea, DO;  Location: [place] [facility];  Service: Gastroenterology;  Laterality: Left;  . COLONOSCOPY WITH PROPOFOL N/A 04/21/2016   Procedure: COLONOSCOPY WITH PROPOFOL;  Surgeon: Irene Shipper, MD;  Location: [place];  Service: Endoscopy;  Laterality: N/A;  . CORONARY ARTERY BYPASS GRAFT  07/09/2012   Procedure: CORONARY ARTERY BYPASS GRAFTING (CABG);  Surgeon: Ivin Poot, MD;  Location: [place];  Service: Open Heart Surgery;  Laterality: N/A;  . ENTEROSCOPY N/A 10/07/2019   Procedure: ENTEROSCOPY;  Surgeon: Jackquline Denmark, MD;  Location: [facility] ENDOSCOPY;  Service: Endoscopy;  Laterality: N/A;  . ENTEROSCOPY Left 02/25/2020   Procedure: ENTEROSCOPY;  Surgeon: Lavena Bullion, DO;  Location: [place];  Service: Gastroenterology;  Laterality: Left;  . ESOPHAGOGASTRODUODENOSCOPY N/A 04/20/2016   Procedure: ESOPHAGOGASTRODUODENOSCOPY (EGD);  Surgeon: Gatha Mayer, MD;  Location: [facility] ENDOSCOPY;  Service: Endoscopy;  Laterality: N/A;  . ESOPHAGOGASTRODUODENOSCOPY (EGD) WITH PROPOFOL N/A 08/17/2019   Procedure: ESOPHAGOGASTRODUODENOSCOPY (EGD) WITH PROPOFOL;  Surgeon: Irene Shipper, MD;  Location: [facility] ENDOSCOPY;  Service: Endoscopy;  Laterality: N/A;  . HEMOSTASIS CLIP PLACEMENT  02/25/2020   Procedure: HEMOSTASIS CLIP PLACEMENT;  Surgeon: Lavena Bullion, DO;  Location: [place];  Service: Gastroenterology;;  . HOT HEMOSTASIS N/A 02/25/2020   Procedure: HOT HEMOSTASIS (ARGON PLASMA COAGULATION/BICAP);  Surgeon: Lavena Bullion, DO;  Location: [facility] ENDOSCOPY;  Service: Gastroenterology;  Laterality: N/A;  . INSERTION OF DIALYSIS CATHETER Right 08/25/2019   Procedure: INSERTION OF DIALYSIS CATHETER;  Surgeon: Waynetta Sandy, MD;  Location: [place];  Service: Vascular;  Laterality: Right;  . IR FLUORO GUIDE CV LINE RIGHT  08/19/2019  . IR US GUIDE VASC ACCESS RIGHT  08/19/2019  . LEFT HEART CATHETERIZATION WITH CORONARY ANGIOGRAM N/A 06/29/2012   Procedure: LEFT HEART CATHETERIZATION WITH CORONARY ANGIOGRAM;  Surgeon: Peter M Martinique, MD;  Location: [facility] CATH LAB;  Service: Cardiovascular;  Laterality: N/A;  . PERIPHERAL VASCULAR ATHERECTOMY Left 02/06/2020   Procedure: PERIPHERAL VASCULAR ATHERECTOMY;  Surgeon: Waynetta Sandy, MD;  Location: [place] [facility];  Service: Cardiovascular;  Laterality: Left;  SFA  . PERIPHERAL VASCULAR INTERVENTION Left 02/06/2020   Procedure: PERIPHERAL VASCULAR INTERVENTION;  Surgeon: Waynetta Sandy, MD;  Location: [place] [facility];  Service:  Cardiovascular;  Laterality: Left;  SFA  . REVISON OF ARTERIOVENOUS FISTULA Left 02/07/2020   Procedure: REVISON OF ARTERIOVENOUS FISTULA;  Surgeon: Waynetta Sandy, MD;  Location: [place];  Service: Vascular;  Laterality: Left;  . STERNAL WOUND DEBRIDEMENT  08/17/2012   Procedure: STERNAL WOUND DEBRIDEMENT;  Surgeon: Ivin Poot, MD;  Location: [place];  Service: Thoracic;  Laterality: N/A;  wound vac application  . STERNAL WOUND DEBRIDEMENT  08/24/2012   Procedure: STERNAL WOUND DEBRIDEMENT;  Surgeon: Ivin Poot, MD;  Location: [place];  Service: Thoracic;  Laterality: N/A;  . STERNAL WOUND DEBRIDEMENT  09/01/2012   Procedure: STERNAL WOUND DEBRIDEMENT;  Surgeon: Ivin Poot, MD;  Location: [place];  Service: Thoracic;  Laterality: N/A;  . STERNAL WOUND DEBRIDEMENT  09/20/2012   Procedure: STERNAL WOUND DEBRIDEMENT;  Surgeon: Ivin Poot, MD;  Location: [facility] OR;  Service: Thoracic;  Laterality: N/A;  wound vac change  . SUBMUCOSAL TATTOO INJECTION  10/07/2019   Procedure: SUBMUCOSAL TATTOO INJECTION;  Surgeon: Jackquline Denmark, MD;  Location: [facility] ENDOSCOPY;  Service: Endoscopy;;    Allergies  Allergen Reactions  . Naproxen Rash    Prior to Admission medications   Medication Sig Start Date End Date  Taking? Authorizing Provider  allopurinol (ZYLOPRIM) 100 MG tablet Take 100 mg by mouth 2 (two) times daily.  11/25/17  Yes [provider]  aspirin EC 325 MG tablet Take 325 mg by mouth daily. 10/28/19  Yes [provider]  buPROPion (WELLBUTRIN SR) 150 MG 12 hr tablet Take 1 tablet (150 mg total) by mouth 2 (two) times daily. 01/16/16  Yes Mikhail, Velta Addison, DO  cetirizine (ZYRTEC) 10 MG tablet Take 10 mg by mouth daily as needed for allergies.    Yes [provider]  clopidogrel (PLAVIX) 75 MG tablet Take 1 tablet (75 mg total) by mouth daily with breakfast. 02/11/20  Yes Aline August, MD  ferrous sulfate 325 (65 FE) MG tablet Take 325 mg by mouth 2 (two) times  daily with a meal.    Yes [provider]  gabapentin (NEURONTIN) 300 MG capsule Take 1 capsule (300 mg total) by mouth at bedtime. 02/10/20  Yes Aline August, MD  insulin aspart (NOVOLOG) 100 UNIT/ML injection Inject 2 Units into the skin 3 (three) times daily with meals. 02/10/20  Yes Aline August, MD  insulin lispro (HUMALOG) 100 UNIT/ML injection Inject 2 Units into the skin 3 (three) times daily before meals.   Yes [provider]  latanoprost (XALATAN) 0.005 % ophthalmic solution Place 1 drop into both eyes at bedtime.   Yes [provider]  LINZESS 72 MCG capsule Take 72 mcg by mouth daily before breakfast.  11/21/17  Yes [provider]  metoprolol tartrate (LOPRESSOR) 25 MG tablet Take 1 tablet (25 mg total) by mouth 2 (two) times daily. 02/10/20  Yes Aline August, MD  mometasone-formoterol (DULERA) 100-5 MCG/ACT AERO Inhale 2 puffs into the lungs every 4 (four) hours as needed for wheezing.   Yes [provider]  multivitamin (RENA-VIT) TABS tablet Take 1 tablet by mouth at bedtime. 02/10/20  Yes Aline August, MD  nitroGLYCERIN (NITROSTAT) 0.4 MG SL tablet Place 1 tablet (0.4 mg total) under the tongue every 5 (five) minutes as needed for chest pain. 04/30/19  Yes [name], MD  oxyCODONE (OXY IR/ROXICODONE) 5 MG immediate release tablet Take 1 tablet (5 mg total) by mouth every 6 (six) hours as needed for moderate pain. 02/10/20  Yes Rhyne, Samantha J, PA-C  polyethylene glycol (MIRALAX) 17 g packet Take 17 g by mouth daily as needed for moderate constipation. 02/10/20  Yes Aline August, MD  pravastatin (PRAVACHOL) 40 MG tablet Take 40 mg by mouth daily. 10/28/19  Yes [provider]  PROAIR HFA 108 (90 Base) MCG/ACT inhaler Inhale 2 puffs into the lungs every 6 (six) hours as needed for wheezing.  11/17/17  Yes [provider]  TUMS 500 MG chewable tablet Chew 2 tablets by mouth at bedtime. 01/19/20  Yes [provider]   VELPHORO 500 MG chewable tablet Chew 2 tablets (1,000 mg total) by mouth 3 (three) times daily with meals. 02/10/20  Yes Aline August, MD  pantoprazole (PROTONIX) 40 MG tablet Take 1 tablet (40 mg total) by mouth daily. Patient not taking: Reported on 02/21/2020 08/26/19   Jeanmarie Hubert, MD    Social History   Socioeconomic History  . Marital status: Single    Spouse name: Not on file  . Number of children: 0  . Years of education: Not on file  . Highest education level: Not on file  Occupational History  . Not on file  Tobacco Use  . Smoking status: Current Some Day Smoker  Packs/day: 0.25    Years: 50.00    Pack years: 12.50    Types: Cigarettes  . Smokeless tobacco: Never Used  Substance and Sexual Activity  . Alcohol use: No    Alcohol/week: 0.0 standard drinks  . Drug use: Yes    Types: Cocaine    Comment: pt denies at current time  . Sexual activity: Not Currently  Other Topics Concern  . Not on file  Social History Narrative   Lives with boyfriend of 20 years.    Social Determinants of Health   Financial Resource Strain:   . Difficulty of Paying Living Expenses: Not on file  Food Insecurity:   . Worried About Charity fundraiser in the Last Year: Not on file  . Ran Out of Food in the Last Year: Not on file  Transportation Needs:   . Lack of Transportation (Medical): Not on file  . Lack of Transportation (Non-Medical): Not on file  Physical Activity:   . Days of Exercise per Week: Not on file  . Minutes of Exercise per Session: Not on file  Stress:   . Feeling of Stress : Not on file  Social Connections:   . Frequency of Communication with Friends and Family: Not on file  . Frequency of Social Gatherings with Friends and Family: Not on file  . Attends Religious Services: Not on file  . Active Member of Clubs or Organizations: Not on file  . Attends Archivist Meetings: Not on file  . Marital Status: Not on file  Intimate Partner Violence:    . Fear of Current or Ex-Partner: Not on file  . Emotionally Abused: Not on file  . Physically Abused: Not on file  . Sexually Abused: Not on file     Family History  Problem Relation Age of Onset  . Diabetes Mother   . Hypertension Mother   . Cancer Mother   . Hyperlipidemia Father   . Hypertension Father   . Kidney disease Father   . Gout Father   . Cerebrovascular Accident Father   . Other Other        no known family CAD    ROS:  Swelling left lower extremity   Physical Examination  Vitals:   02/26/20 0914 02/26/20 1702  BP: 133/67 (!) 128/94  Pulse: 62 100  Resp: 18 20  Temp: 98.8 F (37.1 C) 98.5 F (36.9 C)  SpO2: 100% 100%   Body mass index is 39.35 kg/m.  She is awake alert and oriented Nonlabored respirations Left upper arm fistula with mild pulsatility Left foot is warm, there is 2+ edema from the knee to the ankle    CBC    Component Value Date/Time   WBC 9.1 02/26/2020 0715   RBC 2.92 (L) 02/26/2020 0715   HGB 8.2 (L) 02/26/2020 0715   HCT 28.5 (L) 02/26/2020 0715   HCT 22.0 (L) 10/06/2019 0021   PLT 181 02/26/2020 0715   MCV 97.6 02/26/2020 0715   MCH 28.1 02/26/2020 0715   MCHC 28.8 (L) 02/26/2020 0715   RDW 17.8 (H) 02/26/2020 0715   LYMPHSABS 1.4 02/21/2020 1655   MONOABS 0.7 02/21/2020 1655   EOSABS 0.4 02/21/2020 1655   BASOSABS 0.0 02/21/2020 1655    BMET    Component Value Date/Time   NA 140 02/26/2020 0715   K 3.8 02/26/2020 0715   CL 102 02/26/2020 0715   CO2 28 02/26/2020 0715   GLUCOSE 164 (H) 02/26/2020 0715  BUN 19 02/26/2020 0715   CREATININE 3.57 (H) 02/26/2020 0715   CREATININE 2.62 (H) 04/29/2016 1629   CALCIUM 8.3 (L) 02/26/2020 0715   GFRNONAA 12 (L) 02/26/2020 0715   GFRAA 14 (L) 02/26/2020 0715    COAGS: Lab Results  Component Value Date   INR 1.2 08/25/2019   INR 1.2 04/28/2019   INR 1.17 09/23/2016      ASSESSMENT/PLAN: This is a 69 y.o. female recent history of left lower extremity  endovascular revascularization with atherectomy and stenting of SFA and left popliteal artery.  We will get duplex and ABIs to evaluate flow currently there is no eschar where the great toe was amputated and hopefully this will heal without further intervention.  Aulton Routt C. Donzetta Matters, MD [facility] Office: [phone] Pager: [phone]

## 2020-02-26 NOTE — Progress Notes (Signed)
Family Medicine Teaching Service Daily Progress Note Intern Pager: [phone]  Patient name: [name] [name] Medical record number: [id number] Date of birth: 09-29-1951 Age: 69 y.o. Gender: female  Primary Care Provider: [facility] Consultants: GI, nephro Code Status: Full  Pt Overview and Major Events to Date:  2/23 admitted 2/27-EGD/colonoscopy performed.  Assessment and Plan: [name] Spirito Lindsayis a 69 y.o.femalepresenting with symptomatic anemia. PMH is significant forCAD s/p CABG, combined systolic and diastolic CHF, PVD, ESRD on HD (TTS), DM type II,s/paortogram and arthrectomy of left SFA andpopliteal arteries, s/p stent of L popliteal artery, s/p L great toe amputation2/2 DM and gangrene,hyperparathyroidism, asthma, polysubstance abuse.  Acute on chronicsymptomatic Anemia, h/o GI bleed Hgb 8.2 today.  Stable. 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 EGD showed nonbleeding angiectasia treated with APC, non obstructing schatzki's ring, and 5cm hiatal hernia.  -GI recs: ADAT,repeat cbc after one week s/p d/c, GI outpatient follow-up in 4 weeks, resume PPI - f/u cbc, rfp.  -Transfusion threshold less than 8 -Protonix 40 mg daily -SCDs for DVT prophylaxis -Up with assistance -PT/OT following  ESRD on HD Chronic.  Stable.  Dialysis schedule Tuesdays, Thursdays, Saturdays.  Patient was dialyzed saturday.  Right upper chest HD cath intact.  Revision of left cephalic vein AV fistula (02/21).   -Nephro following, appreciate recommendations -Strict I's and O -Daily weight -Monitor fluid status -RFP in a.m.  CAD s/p CABG CHF H/O PAF/A.flutter Chronic.  Stable.  Meds include ASA, Plavix (on hold in setting of GI bleed) and pravastatin.  Nitroglycerin as needed.  Denies any chest pain or shortness of breath. -Nitroglycerin as needed -Pravastatin 20 mg daily  HTN Soft blood  pressures.  94/77 open (MAP 83) SBP range of 94-152 and DBP 51-77 over past 24 hours. -Monitor blood pressure -Continue to hold Lopressor, restart when BP tolerates  PAD s/p L great toe amputation Stable.  Aortogram with atherectomy of the left SFA and popliteal arteries as well as stenting of left popliteal artery (02/06/2020).  Amputation of left hallux for gangrene (02/07/2020) -Curbside for vascular: discontinue Plavix and ASA held given GI bleed; restart ASA 81mg  daily after colonoscopy  DMT2 CBG sub 200 over past several days.  Home medications NovoLog and Humalog -Continue to hold home medications -Continue sensitive sliding scale insulin coverage -Monitor CBGs  Peripheral Neuropathy Chronic.  Stable.  Home medication gabapentin 300 mg nightly -Continue gabapentin  GERD Home medication Protonix p.o. -Start IVprotonix  Asthma Home medications include ProAir, Atrovent, and Dulera. -Continue Dulera 2puffs q4h PRN wheezing, albuterol 2 puff q6h PRN wheezing  Polysubstance use Tobacco use.  Currently on Wellbutrin 150 twice daily.  Patient also endorsed occasional cocaine use (07/20) -Encourage smoking cessation -Courage abstinence of cocaine -NicoDerm patch if patient requests  MDD, GAD Stable.Continue home medication Wellbutrin150 mg BID.  Gout Continue home meds:allopurinol 100 mg p.o. twice daily - consider dose adjustment outpatient given ESRD  Afib/flutter New onset noted at 09/2019 admission for GI bleed. EKG NSR on admission. Not anticoagulated due to h/o GI bleeds and frequent falls. CHADSVASc 8 with stroke risk >10%, HAS-BLED 5 suggesting alternative to anticoagulation.   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 RV with normal systolic function.  FEN/GI:NPO pending GI recommendations Prophylaxis:SCDsfor DVT ppx  Disposition: back to SNF.    Subjective:  Patient feels better today.  Would like to  go back to her SNF.  Understands it will be likely tomorrow.  No abdominal pain, no chest pain.  Patient was concerned about the status of her toe and want me to see if it looked infected.  Objective: Temp:  [97.2 F (36.2 C)-99 F (37.2 C)] 98 F (36.7 C) (02/28 0615) Pulse Rate:  [47-81] 81 (02/28 0615) Resp:  [14-20] 18 (02/28 0615) BP: (88-172)/(41-115) 110/96 (02/28 0615) SpO2:  [76 %-100 %] 99 % (02/28 0615) Weight:  [117.4 kg-119.8 kg] 117.4 kg (02/28 0500) Physical Exam: General: Alert and oriented, sitting up in bed. Cardiovascular: Distant heart sounds, normal rate, normal rhythm. Respiratory: Lungs clear to auscultation bilaterally.  No crackles or wheezes. Abdomen: Soft, nontender to palpation.  Normal bowel sounds. Extremities: Left great toe status post amputation.  No pus, no erythema.  Laboratory: Recent Labs  Lab 02/23/20 0408 02/24/20 1148 02/25/20 0443  WBC 10.3 8.4 7.9  HGB 8.3* 8.1* 8.0*  HCT 26.0* 26.5* 26.8*  PLT 213 194 204   Recent Labs  Lab 02/24/20 0844 02/24/20 1148 02/25/20 0443  NA 140 143 143  K 3.9 3.7 3.5  CL 103 105 104  CO2 21* 25 25  BUN 28* 27* 28*  CREATININE 3.89* 4.17* 4.47*  CALCIUM 7.9* 8.2* 8.3*  GLUCOSE 96 85 84     Benay Pike, MD 02/26/2020, 7:01 AM PGY-2, [facility] Intern pager: [phone], text pages welcome

## 2020-02-26 NOTE — Progress Notes (Signed)
[facility] ROUNDING NOTE   Subjective: Small bowel enteroscopy and colonoscopy completed yesterday.  Enteroscopy with a single duodenal AVM, treated with APC, and GAVE, also treated with APC. Actively bleeding AVM in the hepatic flexure on colonoscopy, treated with hemostatic clip.  There was a benign appearing colon small polyp, that was not resected.  Otherwise, no acute events overnight.  No further overt GI blood loss.  Tolerating full meal last night and this morning.  Hemoglobin stable at 8.2.  Hemodialysis yesterday.   Objective: Vital signs in last 24 hours: Temp:  [97.2 F (36.2 C)-99 F (37.2 C)] 98 F (36.7 C) (02/28 0615) Pulse Rate:  [47-81] 81 (02/28 0615) Resp:  [14-20] 18 (02/28 0615) BP: (88-172)/(41-115) 110/96 (02/28 0615) SpO2:  [76 %-100 %] 99 % (02/28 0615) Weight:  [117.4 kg-119.8 kg] 117.4 kg (02/28 0500) Last BM Date: 02/25/20 General: NAD, sitting upright and eating breakfast Abdomen:  Soft, NT, ND, +BS    Intake/Output from previous day: 02/27 0701 - 02/28 0700 In: 520 [P.O.:120; I.V.:400] Out: 2174 [Urine:350] Intake/Output this shift: Total I/O In: 360 [P.O.:360] Out: 0    Lab Results: Recent Labs    02/24/20 1148 02/25/20 0443 02/26/20 0715  WBC 8.4 7.9 9.1  HGB 8.1* 8.0* 8.2*  PLT 194 204 181  MCV 93.6 95.4 97.6   BMET Recent Labs    02/24/20 1148 02/25/20 0443 02/26/20 0715  NA 143 143 140  K 3.7 3.5 3.8  CL 105 104 102  CO2 25 25 28   GLUCOSE 85 84 164*  BUN 27* 28* 19  CREATININE 4.17* 4.47* 3.57*  CALCIUM 8.2* 8.3* 8.3*   LFT Recent Labs    02/24/20 1148 02/25/20 0443 02/26/20 0715  ALBUMIN 2.3* 2.3* 2.3*   PT/INR No results for input(s): INR in the last 72 hours.    Imaging/Other results: CT Head Wo Contrast  Result Date: 02/24/2020 CLINICAL DATA:  Altered mental status EXAM: CT HEAD WITHOUT CONTRAST TECHNIQUE: Contiguous axial images were obtained from the base of the skull through the  vertex without intravenous contrast. COMPARISON:  12/23/2017 FINDINGS: Brain: No evidence of acute infarction, hemorrhage, hydrocephalus, extra-axial collection or mass lesion/mass effect. 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 Extensive low-density changes within the periventricular and subcortical white matter compatible with chronic microvascular ischemic change. Mild-moderate diffuse cerebral volume loss. Vascular: Atherosclerotic calcifications involving the large vessels of the skull base. No unexpected hyperdense vessel. Skull: Normal. Negative for fracture or focal lesion. Sinuses/Orbits: No acute finding. Other: None. IMPRESSION: 1.  No acute intracranial findings. 2.  Chronic microvascular ischemic change and cerebral volume loss. Electronically Signed   By: Davina Poke D.O.   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  Additional small AVM in the duodenum (APC) and GAVE (APC).  Hemoglobin stable and no further evidence of ongoing bleeding.  -Resume PPI -Repeat hemoglobin with dialysis in 7-10 days to ensure ongoing stability -Can follow-up in the GI clinic in approximately 4 weeks (previously followed with Dr. Henrene Pastor) -GI service will sign off at this time.  Please not hesitate to contact additional questions or concerns.   Lavena Bullion, DO  02/26/2020, 9:07 AM [facility] Pager [phone]

## 2020-02-26 NOTE — Progress Notes (Signed)
Brief note, I will consign resident's note once it is complete.  Patient denies any new concern. Feels well post endoscopy. Denies rectal bleed or blood in her stool.  No acute findings on exam.  Endoscopy report reviewed.  Continue PPI.  D/C to SNF

## 2020-02-26 NOTE — TOC Initial Note (Signed)
Transition of Care [facility]) - Initial/Assessment Note    Patient Details  Name: [name] [name] MRN: [id number] Date of Birth: Oct 17, 1951  [facility] CM/SW Contact:    Arvella Merles, LCSW Phone Number: 02/26/2020, 2:45 PM  Clinical Narrative:                 CSW received consult for possible SNF placement at time of discharge. CSW spoke with patient regarding PT recommendation of SNF placement at time of discharge. Patient expressed understanding of PT recommendation and is agreeable to SNF placement at time of discharge. Patient reports she would like to [facility] to [facility].   CSW reached out to [place] with [facility] [phone] and was not able to make contact regarding patient's readmission.  No further questions reported at this time. CSW to continue to follow and assist with discharge planning needs.   Expected Discharge Plan: Skilled Nursing Facility Barriers to Discharge: No Barriers Identified   Patient Goals and CMS Choice Patient states their goals for this hospitalization and ongoing recovery are:: To return home CMS Medicare.gov Compare Post Acute Care list provided to:: Patient Choice offered to / list presented to : Patient  Expected Discharge Plan and Services Expected Discharge Plan: [place]       Living arrangements for the past 2 months: [place], Apartment                                      Prior Living Arrangements/Services Living arrangements for the past 2 months: [place], Apartment Lives with:: Self Patient language and need for interpreter reviewed:: Yes Do you feel safe going back to the place where you live?: Yes      Need for Family Participation in Patient Care: No (Comment) Care giver support system in place?: No (comment)   Criminal Activity/Legal Involvement Pertinent to Current Situation/Hospitalization: No - Comment as needed  Activities of Daily Living Home  Assistive Devices/Equipment: None ADL Screening (condition at time of admission) Patient's cognitive ability adequate to safely complete daily activities?: Yes Is the patient deaf or have difficulty hearing?: No Does the patient have difficulty seeing, even when wearing glasses/contacts?: No Does the patient have difficulty concentrating, remembering, or making decisions?: No Patient able to express need for assistance with ADLs?: Yes Does the patient have difficulty dressing or bathing?: No Independently performs ADLs?: Yes (appropriate for developmental age) Does the patient have difficulty walking or climbing stairs?: Yes Weakness of Legs: Both Weakness of Arms/Hands: None  Permission Sought/Granted Permission sought to share information with : Facility Art therapist granted to share information with : Yes, Verbal Permission Granted     Permission granted to share info w AGENCY: SNF        Emotional Assessment   Attitude/Demeanor/Rapport: Unable to Assess Affect (typically observed): Unable to Assess Orientation: : Oriented to Self, Oriented to Place, Oriented to  Time, Oriented to Situation Alcohol / Substance Use: Not Applicable Psych Involvement: No (comment)  Admission diagnosis:  Dialysis patient ([place]) [Z99.2] Symptomatic anemia [D64.9] Gastrointestinal hemorrhage, unspecified gastrointestinal hemorrhage type [K92.2] Patient Active Problem List   Diagnosis Date Noted  . AVM (arteriovenous malformation) of duodenum, acquired   . GAVE (gastric antral vascular ectasia)   . Polyp of hepatic flexure of colon   . Acute post-hemorrhagic anemia   . Dialysis patient ([place])   . Gastroesophageal reflux  disease without esophagitis   . AVM (arteriovenous malformation) of colon   . Symptomatic anemia 02/21/2020  . AMS (altered mental status) 01/01/2020  . Recurrent falls 10/09/2019  . Generalized weakness 10/09/2019  . Unspecified atrial fibrillation ([place])  10/08/2019  . Acute GI bleeding 10/05/2019  . Hyperkalemia   . Depression   . Esophageal stricture   . Diabetic gastroparesis ([place])   . Upper GI bleed 08/16/2019  . Elevated troponin 04/27/2019  . Hiatal hernia   . Iron deficiency anemia   . Microcytic anemia 02/05/2016  . Acute on chronic renal failure ([place])   . CAD in native artery   . Substance abuse ([place])   . Chronic combined systolic and diastolic CHF (congestive heart failure) ([place])   . ESRD (end stage renal disease) ([place])   . Obesity, Class III, BMI 40-49.9 (morbid obesity) ([place])   . Type 2 diabetes mellitus with chronic kidney disease on chronic dialysis, with long-term current use of insulin ([place]) 10/18/2014  . S/P CABG (coronary artery bypass graft) 09/21/2013  . Arthritis of right shoulder region 03/23/2013  . Tobacco abuse   . Coronary artery disease   . NSTEMI (non-ST elevated myocardial infarction) ([place]) 06/29/2012  . History of cocaine abuse ([place]) 06/29/2012  . Essential hypertension 06/29/2012  . Glaucoma   . Hyperlipidemia   . History of CVA (cerebrovascular accident)    PCP:  [facility] Pharmacy:   [facility] [address], [place] [address] Phone: [phone] Fax: [place], [place] - [address] [place] [address] Phone: [phone] Fax: [phone]  [facility] [place] [place] [address] [place] [address] Phone: [phone] Fax: [phone]     Social Determinants of Health (SDOH) Interventions    Readmission Risk Interventions Readmission Risk Prevention Plan 12/12/2019 10/08/2019 08/26/2019  Transportation Screening Complete - Complete  Medication Review (RN Care Manager) Complete - Complete  PCP or Specialist appointment within 3-5 days of discharge Complete - Complete  [place] or [place] - - Complete  SW Recovery Care/Counseling Consult - - Complete  [place] - - Not Applicable  Some recent data might be hidden

## 2020-02-27 ENCOUNTER — Inpatient Hospital Stay (HOSPITAL_COMMUNITY): Payer: Medicare (Managed Care)

## 2020-02-27 DIAGNOSIS — I739 Peripheral vascular disease, unspecified: Secondary | ICD-10-CM

## 2020-02-27 LAB — RENAL FUNCTION PANEL
Albumin: 2.3 g/dL — ABNORMAL LOW (ref 3.5–5.0)
Anion gap: 14 (ref 5–15)
BUN: 36 mg/dL — ABNORMAL HIGH (ref 8–23)
CO2: 25 mmol/L (ref 22–32)
Calcium: 8.2 mg/dL — ABNORMAL LOW (ref 8.9–10.3)
Chloride: 103 mmol/L (ref 98–111)
Creatinine, Ser: 4.56 mg/dL — ABNORMAL HIGH (ref 0.44–1.00)
GFR calc Af Amer: 11 mL/min — ABNORMAL LOW (ref >60)
GFR calc non Af Amer: 9 mL/min — ABNORMAL LOW (ref >60)
Glucose, Bld: 147 mg/dL — ABNORMAL HIGH (ref 70–99)
Phosphorus: 4.6 mg/dL (ref 2.5–4.6)
Potassium: 3.9 mmol/L (ref 3.5–5.1)
Sodium: 142 mmol/L (ref 135–145)

## 2020-02-27 LAB — CBC
HCT: 26.3 % — ABNORMAL LOW (ref 36.0–46.0)
Hemoglobin: 7.8 g/dL — ABNORMAL LOW (ref 12.0–15.0)
MCH: 28.3 pg (ref 26.0–34.0)
MCHC: 29.7 g/dL — ABNORMAL LOW (ref 30.0–36.0)
MCV: 95.3 fL (ref 80.0–100.0)
Platelets: 197 10*3/uL (ref 150–400)
RBC: 2.76 MIL/uL — ABNORMAL LOW (ref 3.87–5.11)
RDW: 17.3 % — ABNORMAL HIGH (ref 11.5–15.5)
WBC: 8.6 10*3/uL (ref 4.0–10.5)
nRBC: 0.2 % (ref 0.0–0.2)

## 2020-02-27 LAB — GLUCOSE, CAPILLARY
Glucose-Capillary: 128 mg/dL — ABNORMAL HIGH (ref 70–99)
Glucose-Capillary: 164 mg/dL — ABNORMAL HIGH (ref 70–99)
Glucose-Capillary: 93 mg/dL (ref 70–99)
Glucose-Capillary: 178 mg/dL — ABNORMAL HIGH (ref 70–99)

## 2020-02-27 LAB — HEMOGLOBIN AND HEMATOCRIT, BLOOD
HCT: 31.7 % — ABNORMAL LOW (ref 36.0–46.0)
Hemoglobin: 9.6 g/dL — ABNORMAL LOW (ref 12.0–15.0)

## 2020-02-27 LAB — PREPARE RBC (CROSSMATCH)

## 2020-02-27 MED ORDER — CHLORHEXIDINE GLUCONATE CLOTH 2 % EX PADS
6.0000 | MEDICATED_PAD | Freq: Every day | CUTANEOUS | Status: DC
  Administered 2020-02-27 – 2020-02-28 (×2): 6 via TOPICAL

## 2020-02-27 MED ORDER — SODIUM CHLORIDE 0.9% IV SOLUTION: Freq: Once | INTRAVENOUS | Status: AC

## 2020-02-27 NOTE — Progress Notes (Signed)
ABI's and left lower extremity arterial duplex have been completed. Preliminary results can be found in CV Proc through chart review.   02/27/20 11:44 AM [name] [name] RVT

## 2020-02-27 NOTE — Plan of Care (Signed)
  Problem: Education: Goal: Ability to identify signs and symptoms of gastrointestinal bleeding will improve Outcome: Progressing   Problem: Bowel/Gastric: Goal: Will show no signs and symptoms of gastrointestinal bleeding Outcome: Progressing   Problem: Fluid Volume: Goal: Will show no signs and symptoms of excessive bleeding Outcome: Progressing   Problem: Clinical Measurements: Goal: Complications related to the disease process, condition or treatment will be avoided or minimized Outcome: Progressing   Problem: Education: Goal: Knowledge of General Education information will improve Description Including pain rating scale, medication(s)/side effects and non-pharmacologic comfort measures Outcome: Progressing   Problem: Health Behavior/Discharge Planning: Goal: Ability to manage health-related needs will improve Outcome: Progressing   Problem: Clinical Measurements: Goal: Ability to maintain clinical measurements within normal limits will improve Outcome: Progressing Goal: Will remain free from infection Outcome: Progressing Goal: Diagnostic test results will improve Outcome: Progressing Goal: Respiratory complications will improve Outcome: Progressing Goal: Cardiovascular complication will be avoided Outcome: Progressing   Problem: Activity: Goal: Risk for activity intolerance will decrease Outcome: Progressing   Problem: Nutrition: Goal: Adequate nutrition will be maintained Outcome: Progressing   Problem: Coping: Goal: Level of anxiety will decrease Outcome: Progressing   Problem: Elimination: Goal: Will not experience complications related to bowel motility Outcome: Progressing Goal: Will not experience complications related to urinary retention Outcome: Progressing   Problem: Pain Managment: Goal: General experience of comfort will improve Outcome: Progressing   Problem: Safety: Goal: Ability to remain free from injury will improve Outcome:  Progressing   Problem: Skin Integrity: Goal: Risk for impaired skin integrity will decrease Outcome: Progressing   

## 2020-02-27 NOTE — Progress Notes (Addendum)
Family Medicine Teaching Service Daily Progress Note Intern Pager: [phone]  Patient name: [name] [name] Medical record number: [id number] Date of birth: Apr 25, 1951 Age: 69 y.o. Gender: female  Primary Care Provider: [facility] Consultants: GI, nephro Code Status: Full  Pt Overview and Major Events to Date:  2/23 admitted 2/27-EGD/colonoscopy performed.  Assessment and Plan: [name] Hyneman Lindsayis a 69 y.o.femalepresenting with symptomatic anemia. PMH is significant forCAD s/p CABG, combined systolic and diastolic CHF, PVD, ESRD on HD (TTS), DM type II,s/paortogram and arthrectomy of left SFA andpopliteal arteries, s/p stent of L popliteal artery, s/p L great toe amputation2/2 DM and gangrene,hyperparathyroidism, asthma, polysubstance abuse.  Acute on chronicsymptomatic Anemia, h/o GI bleed Hgb 7.8 today, plan to give 1U pRBCs and recheck h/h after. 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 EGD showed nonbleeding angiectasia treated with APC, non obstructing schatzki's ring, and 5cm hiatal hernia. Patient reports 2 bloody BMs, yesterday afternoon with dark blood, yesterday evening with brighter red blood. Depending on post transfusion H/H patient may be able to dispo to SNF if Hgb appropriately increases, can recheck hgb in 5 days at dialysis. If not, suspect small chronic bleed and will request GI recommendations. -GI recs: ADAT,repeat cbc after one week s/p d/c, GI outpatient follow-up in 4 weeks, resume PPI - f/u cbc, rfp.  -Transfusion threshold less than 8 -Protonix 40 mg daily -SCDs for DVT prophylaxis -Up with assistance -PT/OT following  ESRD on HD Chronic.  Stable.  Dialysis schedule Tuesdays, Thursdays, Saturdays.  Patient was dialyzed saturday.  Right upper chest HD cath intact.  Revision of left cephalic vein AV fistula (02/21).   -Nephro following, appreciate  recommendations -Strict I's and O -Daily weight -Monitor fluid status -RFP in a.m.  CAD s/p CABG CHF H/O PAF/A.flutter Chronic.  Stable.  Meds include ASA, Plavix (on hold in setting of GI bleed) and pravastatin.  Nitroglycerin as needed.  Denies any chest pain or shortness of breath. -Nitroglycerin as needed -Pravastatin 20 mg, ASA 81 mg  daily  HTN BP's range from hypotensive to normotensive: 136/73 most recently, SBP range of 104-136 and DBP 44-96 over past 24 hours. -Monitor blood pressure -Continue to hold Lopressor, restart when BP tolerates  PAD s/p L great toe amputation Stable.  Aortogram with atherectomy of the left SFA and popliteal arteries as well as stenting of left popliteal artery (02/06/2020).  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 Dr. Donzetta Matters ordered Vascular US with ABIs, which were difficult due to patient noncooperation, R 0.41 and L 0.89, indicating severe R lower extremity artery disease. -Curbside for vascular: discontinue Plavix and ASA held given GI bleed; restart ASA 81mg  daily after colonoscopy  DMT2 CBG sub 200 over past several days, ranged from 113-164 yesterday, 1U aspart given.  Home medications NovoLog and Humalog -Continue to hold home medications -Continue sensitive sliding scale insulin coverage -Monitor CBGs  Peripheral Neuropathy Chronic.  Stable.  Home medication gabapentin 300 mg nightly -Continue gabapentin  GERD Home medication Protonix p.o. -Start PO protonix 40 mg daily  Asthma Home medications include ProAir, Atrovent, and Dulera. -Continue Dulera 2puffs q4h PRN wheezing, albuterol 2 puff q6h PRN wheezing  Polysubstance use Tobacco use.  Currently on Wellbutrin 150 twice daily.  Patient also endorsed occasional cocaine use (07/20) -Encourage smoking cessation -Courage abstinence of cocaine -NicoDerm patch if patient requests  MDD, GAD Stable.Continue home medication Wellbutrin150 mg  BID.  Gout Continue home meds:allopurinol  100 mg p.o. twice daily - Continue allopurinol 100 mg DAILY  Afib/flutter New onset noted at 09/2019 admission for GI bleed. EKG NSR on admission. Not anticoagulated due to h/o GI bleeds and frequent falls. CHADSVASc 8 with stroke risk >10%, HAS-BLED 5 suggesting alternative to anticoagulation.   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 RV with normal systolic function.  FEN/GI:NPO pending GI recommendations Prophylaxis:SCDsfor DVT ppx  Disposition: back to SNF.    Subjective:  Patient feels better today.  Would like to go back to her SNF.    Objective: Temp:  [98 F (36.7 C)-98.8 F (37.1 C)] 98.4 F (36.9 C) (03/01 0449) Pulse Rate:  [62-100] 98 (03/01 0449) Resp:  [18-20] 18 (03/01 0449) BP: (104-136)/(44-96) 136/73 (03/01 0449) SpO2:  [99 %-100 %] 99 % (03/01 0449) Weight:  [117.5 kg] 117.5 kg (02/28 2153) Physical Exam: General: Alert and oriented, sitting up in bed. Cardiovascular: Distant heart sounds, normal rate, normal rhythm. Respiratory: Lungs clear to auscultation bilaterally.  No crackles or wheezes. Abdomen: Soft, nontender to palpation.  Normal bowel sounds. Extremities: Left great toe status post amputation.  No pus, no erythema, old crusted blood present  Laboratory: Recent Labs  Lab 02/25/20 0443 02/26/20 0715 02/27/20 0329  WBC 7.9 9.1 8.6  HGB 8.0* 8.2* 7.8*  HCT 26.8* 28.5* 26.3*  PLT 204 181 197   Recent Labs  Lab 02/24/20 1148 02/25/20 0443 02/26/20 0715  NA 143 143 140  K 3.7 3.5 3.8  CL 105 104 102  CO2 25 25 28   BUN 27* 28* 19  CREATININE 4.17* 4.47* 3.57*  CALCIUM 8.2* 8.3* 8.3*  GLUCOSE 85 84 164*     [name] Damme, MD 02/27/2020, 5:03 AM PGY-1, [facility] Intern pager: [phone], text pages welcome

## 2020-02-27 NOTE — Progress Notes (Signed)
   ABIs reviewed there is nothing to compare to preprocedure given extensive pain.  Overall she is doing well toe appears to be marginally healing.  She has scheduled follow-up in my office and I will see her back in a few days.  Okay for discharge from vascular standpoint.  If there are further issues please contact me.  [name] [name] C. Donzetta Matters, MD [facility] Office: [phone] Pager: [phone] Cell: [phone]

## 2020-02-27 NOTE — Progress Notes (Signed)
[facility] Progress Note   Subjective:   Pt seen in room. Feeling well, excited to go back to SNF. 1 unit PRBC ordered for hgb 7.8. Pt denies any blood in stool this AM. Denies SOB, CP, palpitations, abdominal pain, N/V/D. Tolerated breakfast.   Objective Vitals:   02/26/20 1702 02/26/20 2153 02/27/20 0449 02/27/20 0500  BP: (!) 128/94 (!) 104/44 136/73   Pulse: 100 95 98   Resp: 20 18 18    Temp: 98.5 F (36.9 C) 98 F (36.7 C) 98.4 F (36.9 C)   TempSrc: Oral Oral    SpO2: 100% 100% 99%   Weight:  117.5 kg  117.5 kg  Height:       Physical Exam General: Well developed, obese female. Alert and in NAD Heart: RRR, no murmurs, rubs or gallops Lungs: CTA bilaterally without wheezing, rhonchi or rales Abdomen: Soft, non-tender, non-distended. +BS Extremities: trace edema bilateral lower extremities Dialysis Access:  TDC without erythema/drainahe. Maturing LUE AVF + thrill/bruit  Additional Objective Labs: Basic Metabolic Panel: Recent Labs  Lab 02/25/20 0443 02/26/20 0715 02/27/20 0329  NA 143 140 142  K 3.5 3.8 3.9  CL 104 102 103  CO2 25 28 25   GLUCOSE 84 164* 147*  BUN 28* 19 36*  CREATININE 4.47* 3.57* 4.56*  CALCIUM 8.3* 8.3* 8.2*  PHOS 4.7* 3.8 4.6   Liver Function Tests: Recent Labs  Lab 02/25/20 0443 02/26/20 0715 02/27/20 0329  ALBUMIN 2.3* 2.3* 2.3*   CBC: Recent Labs  Lab 02/21/20 1655 02/22/20 0212 02/23/20 0408 02/23/20 0408 02/24/20 1148 02/24/20 1148 02/25/20 0443 02/26/20 0715 02/27/20 0329  WBC 10.3   < > 10.3   < > 8.4   < > 7.9 9.1 8.6  NEUTROABS 7.8*  --   --   --   --   --   --   --   --   HGB 4.0*   < > 8.3*   < > 8.1*   < > 8.0* 8.2* 7.8*  HCT 14.1*   < > 26.0*   < > 26.5*   < > 26.8* 28.5* 26.3*  MCV 94.0   < > 89.7  --  93.6  --  95.4 97.6 95.3  PLT 370   < > 213   < > 194   < > 204 181 197   < > = values in this interval not displayed.   Blood Culture    Component Value Date/Time   SDES BLOOD RIGHT  ANTECUBITAL 01/31/2020 1158   SPECREQUEST  01/31/2020 1158    BOTTLES DRAWN AEROBIC AND ANAEROBIC Blood Culture results may not be optimal due to an inadequate volume of blood received in culture bottles   CULT  01/31/2020 1158    NO GROWTH 5 DAYS Performed at [facility], [place] [address], [place], [place] [address]    REPTSTATUS 02/05/2020 FINAL 01/31/2020 1158    CBG: Recent Labs  Lab 02/26/20 0656 02/26/20 1118 02/26/20 1636 02/26/20 2152 02/27/20 0721  GLUCAP 167* 142* 141* 113* 128*   Medications:  . sodium chloride   Intravenous Once  . sodium chloride   Intravenous Once  . allopurinol  100 mg Oral Daily  . aspirin EC  81 mg Oral Daily  . atorvastatin  40 mg Oral q1800  . buPROPion  150 mg Oral BID  . Chlorhexidine Gluconate Cloth  6 each Topical Daily  . gabapentin  300 mg Oral QHS  . insulin aspart  0-5  Units Subcutaneous QHS  . insulin aspart  0-6 Units Subcutaneous TID WC  . latanoprost  1 drop Both Eyes QHS  . linaclotide  72 mcg Oral QAC breakfast  . multivitamin  1 tablet Oral QHS  . pantoprazole  40 mg Oral Q0600  . polyethylene glycol  17 g Oral Daily  . sucroferric oxyhydroxide  1,000 mg Oral TID WC    Dialysis Orders: TTS at [facility] --> lastoutpatientHD Sat 2/20 4:15hr, 400/800, EDW 118kg, 2K/3Ca, TDC + maturing AVF, no heparin - Micera 292mcg IV q 2 weeks (last 2/18 - Hgb 6/2 at time) - Calcitriol 37mcg PO q HD  Assessment/Plan: 1. Symptomatic anemia/GIB: Hgb 4 on admit with + FOBT. S/p4U PRBCs. Underwent EGD/colonoscopy 2/27 showed duodenal AVM, GAVE, and actively bleeding hepatic flexure AVM - all treated with clip and/or APC. Hgb 7.8 this AM, plan for 1 more unit PRBC. Pt denies blood in stool. 2. ESRD:Usual TTS schedule, K+ 3.9-next HD 3/2 if still inpatient. 3. Hypertension/volume:BP adequately controlled. Below EDW but has some edema on exam, lower EDW at discharge.  4. Anemiaof ESRD + ABLA: See above. Just given ESA as  outpatient.Hgb 7.8- see above 5. Metabolic bone disease:Ca 4.1/ corrected 9.6. Phos 4.6 stable. Continue binders. 6. CAD (Hx CABG) 7. PAD (s/p recent L great toe aputation): Per primary/vascular 8. T2DM  Anice Paganini, PA-C 02/27/2020, 9:17 AM  [facility] Pager: [phone]

## 2020-02-27 NOTE — TOC Progression Note (Signed)
Transition of Care [facility]) - Progression Note    Patient Details  Name: [name] [name] MRN: [id number] Date of Birth: 13-May-1951  Transition of Care [facility]) CM/SW Contact  Sharlet Salina Mila Homer, LCSW Phone Number: 02/27/2020, 5:40 PM  Clinical Narrative:  [facility] admissions director Freda Munro informed of patient's readiness for discharge. CSW advised that patient is rehab and she will initiate insurance auth with [employer] today. Patient has a 2/28 negative COVID test.      Expected Discharge Plan: [place] Barriers to Discharge: No Barriers Identified  Expected Discharge Plan and Services Expected Discharge Plan: [place] arrangements for the past 2 months: Maish Vaya, Apartment                                       Social Determinants of Health (SDOH) Interventions No SDOH interventions requested or needed at this time.    Readmission Risk Interventions Readmission Risk Prevention Plan 12/12/2019 10/08/2019 08/26/2019  Transportation Screening Complete - Complete  Medication Review Press photographer) Complete - Complete  PCP or Specialist appointment within 3-5 days of discharge Complete - Complete  HRI or [place] - - Complete  SW Recovery Care/Counseling Consult - - Complete  Palliative Care Screening - Not Applicable Not [place] - - Not Applicable  Some recent data might be hidden

## 2020-02-28 LAB — RENAL FUNCTION PANEL
Albumin: 2.3 g/dL — ABNORMAL LOW (ref 3.5–5.0)
Anion gap: 13 (ref 5–15)
BUN: 55 mg/dL — ABNORMAL HIGH (ref 8–23)
CO2: 23 mmol/L (ref 22–32)
Calcium: 7.8 mg/dL — ABNORMAL LOW (ref 8.9–10.3)
Chloride: 103 mmol/L (ref 98–111)
Creatinine, Ser: 5.39 mg/dL — ABNORMAL HIGH (ref 0.44–1.00)
GFR calc Af Amer: 9 mL/min — ABNORMAL LOW (ref >60)
GFR calc non Af Amer: 7 mL/min — ABNORMAL LOW (ref >60)
Glucose, Bld: 150 mg/dL — ABNORMAL HIGH (ref 70–99)
Phosphorus: 5.4 mg/dL — ABNORMAL HIGH (ref 2.5–4.6)
Potassium: 4.1 mmol/L (ref 3.5–5.1)
Sodium: 139 mmol/L (ref 135–145)

## 2020-02-28 LAB — GLUCOSE, CAPILLARY
Glucose-Capillary: 151 mg/dL — ABNORMAL HIGH (ref 70–99)
Glucose-Capillary: 127 mg/dL — ABNORMAL HIGH (ref 70–99)
Glucose-Capillary: 223 mg/dL — ABNORMAL HIGH (ref 70–99)

## 2020-02-28 LAB — BPAM RBC
Blood Product Expiration Date: 202103292359
ISSUE DATE / TIME: 202103011153
Unit Type and Rh: 5100

## 2020-02-28 LAB — TYPE AND SCREEN
ABO/RH(D): O POS
Antibody Screen: NEGATIVE
Unit division: 0

## 2020-02-28 LAB — CBC
HCT: 28.2 % — ABNORMAL LOW (ref 36.0–46.0)
Hemoglobin: 8.8 g/dL — ABNORMAL LOW (ref 12.0–15.0)
MCH: 28.8 pg (ref 26.0–34.0)
MCHC: 31.2 g/dL (ref 30.0–36.0)
MCV: 92.2 fL (ref 80.0–100.0)
Platelets: 220 10*3/uL (ref 150–400)
RBC: 3.06 MIL/uL — ABNORMAL LOW (ref 3.87–5.11)
RDW: 17 % — ABNORMAL HIGH (ref 11.5–15.5)
WBC: 9.8 10*3/uL (ref 4.0–10.5)
nRBC: 0 % (ref 0.0–0.2)

## 2020-02-28 MED ORDER — CALCITRIOL 0.5 MCG PO CAPS
2.0000 ug | ORAL_CAPSULE | ORAL | Status: DC
  Administered 2020-02-28: 2 ug via ORAL

## 2020-02-28 MED ORDER — HEPARIN SODIUM (PORCINE) 1000 UNIT/ML IJ SOLN
INTRAMUSCULAR | Status: AC
  Administered 2020-02-28: 4000 [IU]
  Filled 2020-02-28: qty 4

## 2020-02-28 MED ORDER — ATORVASTATIN CALCIUM 40 MG PO TABS: 40.0000 mg | ORAL_TABLET | Freq: Every day | ORAL | 0 refills | Status: DC

## 2020-02-28 MED ORDER — ASPIRIN 81 MG PO TBEC: 81.0000 mg | DELAYED_RELEASE_TABLET | Freq: Every day | ORAL | 0 refills | Status: AC

## 2020-02-28 MED ORDER — CALCITRIOL 0.5 MCG PO CAPS
ORAL_CAPSULE | ORAL | Status: AC
  Filled 2020-02-28: qty 4

## 2020-02-28 MED ORDER — ALLOPURINOL 100 MG PO TABS: 100.0000 mg | ORAL_TABLET | Freq: Every day | ORAL | 0 refills | Status: DC

## 2020-02-28 MED ORDER — ALLOPURINOL 100 MG PO TABS: 100.0000 mg | ORAL_TABLET | Freq: Every day | ORAL | 0 refills | Status: AC

## 2020-02-28 MED ORDER — ATORVASTATIN CALCIUM 40 MG PO TABS: 40.0000 mg | ORAL_TABLET | Freq: Every day | ORAL | 0 refills | Status: AC

## 2020-02-28 MED ORDER — PANTOPRAZOLE SODIUM 40 MG PO TBEC: 40.0000 mg | DELAYED_RELEASE_TABLET | Freq: Every day | ORAL | 0 refills | Status: AC

## 2020-02-28 MED ORDER — ASPIRIN 81 MG PO TBEC: 81.0000 mg | DELAYED_RELEASE_TABLET | Freq: Every day | ORAL | 0 refills | Status: DC

## 2020-02-28 NOTE — TOC Transition Note (Signed)
Transition of Care [facility]) - CM/SW Discharge Note *Discharge back to [facility] [place] (number for report)   Patient Details  Name: ADISA VIGEANT MRN: [id number] Date of Birth: Sep 08, 1951  Transition of Care [facility]) CM/SW Contact:  Sable Feil, LCSW Phone Number: 02/28/2020, 4:51 PM   Clinical Narrative:  Patient medically stable for discharge and is returning to [facility]. Facility received insurance auth for patient to return and continue rehab. Discharge clinicals transmitted to facility and transport arranged with [facility] - non-emergency transport.     Final next level of care: Skilled Nursing Facility Barriers to Discharge: No Barriers Identified   Patient Goals and CMS Choice Patient states their goals for this hospitalization and ongoing recovery are:: To return home after rehab completed CMS Medicare.gov Compare Post Acute Care list provided to:: Other (Comment Required)(Not needed as patient requested to return to [facility]) Choice offered to / list presented to : NA  Discharge Placement   Existing PASRR number confirmed : 02/28/20          Patient chooses bed at: [facility] Patient to be transferred to facility by: [place] Name of family member notified: Patient will make contact with family/significant other Patient and family notified of of transfer: 02/28/20  Discharge Plan and Services  Back to SNF to continue rehab                DME Agency: NA       HH Arranged: NA          Social Determinants of Health (SDOH) Interventions  No SDOH interventions requested or needed at discharge.   Readmission Risk Interventions Readmission Risk Prevention Plan 12/12/2019 10/08/2019 08/26/2019  Transportation Screening Complete - Complete  Medication Review Press photographer) Complete - Complete  PCP or Specialist appointment within 3-5 days of discharge Complete - Complete  HRI or [place] - - Complete  SW  Recovery Care/Counseling Consult - - Complete  Palliative Care Screening - Not Applicable Not [place] - - Not Applicable  Some recent data might be hidden

## 2020-02-28 NOTE — Progress Notes (Signed)
[name] [name] to be discharged [place]  per MD order. Patient verbalized understanding.  Skin clean, dry and intact without evidence of skin break down, no evidence of skin tears noted. No complaints noted.  Patient free of lines, drains, and wounds.   Discharge packet assembled. An After Visit Summary (AVS) was printed and given to the EMS personnel. Patient escorted via stretcher and discharged to [facility] via ambulance. Report called to accepting facility; all questions and concerns addressed.   Amaryllis Dyke, RN

## 2020-02-28 NOTE — NC FL2 (Signed)
[place] LEVEL OF CARE SCREENING TOOL     IDENTIFICATION  Patient Name: [name] [name] Birthdate: 12-Dec-1951 Sex: female Admission Date (Current Location): 02/21/2020  [place] and [place] Number:  [name] [name] [id number] [place] and Address:  [facility] [place], [place] [address] [place], [place] [address]      Provider Number:    Attending Physician Name and Address:  McDiarmid, Blane Ohara, MD  Relative Name and Phone Number:  [name]; [phone]    Current Level of Care: Hospital Recommended Level of Care: Skilled Nursing Facility(From [facility] SNF) Prior Approval Number:    Date Approved/Denied:   PASRR Number: [id number]  Discharge Plan: SNF    Current Diagnoses: Patient Active Problem List   Diagnosis Date Noted  . AVM (arteriovenous malformation) of duodenum, acquired   . GAVE (gastric antral vascular ectasia)   . Polyp of hepatic flexure of colon   . Acute post-hemorrhagic anemia   . Dialysis patient ([place])   . Gastroesophageal reflux disease without esophagitis   . AVM (arteriovenous malformation) of colon   . Symptomatic anemia 02/21/2020  . AMS (altered mental status) 01/01/2020  . Recurrent falls 10/09/2019  . Generalized weakness 10/09/2019  . Unspecified atrial fibrillation ([place]) 10/08/2019  . Acute GI bleeding 10/05/2019  . Hyperkalemia   . Depression   . Esophageal stricture   . Diabetic gastroparesis ([place])   . Upper GI bleed 08/16/2019  . Elevated troponin 04/27/2019  . Hiatal hernia   . Iron deficiency anemia   . Microcytic anemia 02/05/2016  . Acute on chronic renal failure ([place])   . CAD in native artery   . Substance abuse ([place])   . Chronic combined systolic and diastolic CHF (congestive heart failure) ([place])   . ESRD (end stage renal disease) ([place])   . Obesity, Class III, BMI 40-49.9 (morbid obesity) ([place])   . Type 2 diabetes mellitus with chronic kidney disease on chronic dialysis, with  long-term current use of insulin ([place]) 10/18/2014  . S/P CABG (coronary artery bypass graft) 09/21/2013  . Arthritis of right shoulder region 03/23/2013  . Tobacco abuse   . Coronary artery disease   . NSTEMI (non-ST elevated myocardial infarction) ([place]) 06/29/2012  . History of cocaine abuse ([place]) 06/29/2012  . Essential hypertension 06/29/2012  . Glaucoma   . Hyperlipidemia   . History of CVA (cerebrovascular accident)     Orientation RESPIRATION BLADDER Height & Weight     Self, Time, Situation, Place  Normal Continent Weight: 259 lb 0.7 oz (117.5 kg) Height:  5\' 8"  (172.7 cm)  BEHAVIORAL SYMPTOMS/MOOD NEUROLOGICAL BOWEL NUTRITION STATUS      Continent Diet(Reanl with 1200 mL fluid restriction)  AMBULATORY STATUS COMMUNICATION OF NEEDS Skin   Limited Assist(Min guard) Verbally Other (Comment)(MASD abdomen and groin)                       Personal Care Assistance Level of Assistance  Bathing, Feeding, Dressing Bathing Assistance: Limited assistance(Upper body assistance with set-up; Lower body min assist) Feeding assistance: Limited assistance(Assistance with set-up) Dressing Assistance: Limited assistance(Upper body assistance with set-up; Lower body min assist)     Functional Limitations Info  Sight, Hearing, Speech Sight Info: Impaired(Wears glasses) Hearing Info: Adequate Speech Info: Adequate    SPECIAL CARE FACTORS FREQUENCY  PT (By licensed PT), OT (By licensed OT)     PT Frequency: Evaluated at hospital 2/27. PT at SNF, Eval and Treat, a minimum of 5  days per week OT Frequency: Evaluated at hospital 2/25. OT at SNF, Eval and Treat, a minimum of 5 days per week            Contractures Contractures Info: Not present    Additional Factors Info  Code Status, Allergies, Insulin Sliding Scale Code Status Info: Full Allergies Info: Naproxen   Insulin Sliding Scale Info: 0-6 Units, 3 times per day witth meals; 0-5 Units daily at bedtime       Current  Medications (02/28/2020):  This is the current hospital active medication list Current Facility-Administered Medications  Medication Dose Route Frequency Provider Last Rate Last Admin  . 0.9 %  sodium chloride infusion (Manually program via Guardrails IV Fluids)   Intravenous Once Rory Percy, DO      . acetaminophen (TYLENOL) tablet 650 mg  650 mg Oral Q6H PRN Rory Percy, DO   650 mg at 02/24/20 1522   Or  . acetaminophen (TYLENOL) suppository 650 mg  650 mg Rectal Q6H PRN Rory Percy, DO      . albuterol (PROVENTIL) (2.5 MG/3ML) 0.083% nebulizer solution 3 mL  3 mL Inhalation Q6H PRN Rory Percy, DO      . allopurinol (ZYLOPRIM) tablet 100 mg  100 mg Oral Daily Lockamy, Timothy, DO   100 mg at 02/27/20 1057  . aspirin EC tablet 81 mg  81 mg Oral Daily Benay Pike, MD   81 mg at 02/27/20 1058  . atorvastatin (LIPITOR) tablet 40 mg  40 mg Oral q1800 Lockamy, Timothy, DO   40 mg at 02/27/20 1726  . buPROPion (WELLBUTRIN SR) 12 hr tablet 150 mg  150 mg Oral BID Rory Percy, DO   150 mg at 02/27/20 2238  . calcitRIOL (ROCALTROL) capsule 2 mcg  2 mcg Oral Q T,Th,Sa-HD Collins, Hervey Ard, PA-C      . Chlorhexidine Gluconate Cloth 2 % PADS 6 each  6 each Topical Q0600 Janalee Dane, PA-C   6 each at 02/28/20 0814  . gabapentin (NEURONTIN) capsule 300 mg  300 mg Oral QHS Rory Percy, DO   300 mg at 02/27/20 2238  . insulin aspart (novoLOG) injection 0-5 Units  0-5 Units Subcutaneous QHS Gladys Damme, MD   3 Units at 02/22/20 2333  . insulin aspart (novoLOG) injection 0-6 Units  0-6 Units Subcutaneous TID WC Gladys Damme, MD   1 Units at 02/28/20 0745  . latanoprost (XALATAN) 0.005 % ophthalmic solution 1 drop  1 drop Both Eyes QHS Rumball, Alison, DO   1 drop at 02/27/20 2238  . linaclotide (LINZESS) capsule 72 mcg  72 mcg Oral QAC breakfast Vena Rua, PA-C   72 mcg at 02/27/20 2239  . mometasone-formoterol (DULERA) 100-5 MCG/ACT inhaler 2 puff  2 puff  Inhalation BID PRN Rory Percy, DO      . multivitamin (RENA-VIT) tablet 1 tablet  1 tablet Oral QHS Rory Percy, DO   1 tablet at 02/27/20 2238  . nitroGLYCERIN (NITROSTAT) SL tablet 0.4 mg  0.4 mg Sublingual Q5 min PRN Rory Percy, DO      . oxyCODONE (Oxy IR/ROXICODONE) immediate release tablet 5 mg  5 mg Oral Q6H PRN Gladys Damme, MD   5 mg at 02/28/20 0328  . pantoprazole (PROTONIX) EC tablet 40 mg  40 mg Oral Q0600 Vena Rua, PA-C   40 mg at 02/28/20 4818  . polyethylene glycol (MIRALAX / GLYCOLAX) packet 17 g  17 g Oral Daily Vena Rua, PA-C  17 g at 02/23/20 0837  . sucroferric oxyhydroxide (VELPHORO) chewable tablet 1,000 mg  1,000 mg Oral TID WC Rory Percy, DO   1,000 mg at 02/28/20 0815     Discharge Medications: Please see discharge summary for a list of discharge medications.  Relevant Imaging Results:  Relevant Lab Results:   Additional Information [id number];  HD TTS at [facility], Mila Homer, LCSW

## 2020-02-28 NOTE — Progress Notes (Signed)
[facility] Progress Note   Subjective:    Patient seen in room. No concerns, says "I feel blessed!" Denies SOB, CP, palpitations, dizziness, abdominal pain, N/V/D.   Objective Vitals:   02/27/20 1725 02/27/20 2009 02/28/20 0551 02/28/20 0900  BP: (!) 156/73 131/72 136/61 139/71  Pulse: 88 83 98 90  Resp:    20  Temp:  98.6 F (37 C) 98.9 F (37.2 C) 98.6 F (37 C)  TempSrc:  Oral Oral Oral  SpO2: 100% 100% 100% 100%  Weight:      Height:       Physical Exam General: Well developed, obese female. Alert and in NAD Heart: RRR, no murmurs, rubs or gallops Lungs: CTA bilaterally without wheezing, rhonchi or rales Abdomen: Soft, non-tender, non-distended. +BS Extremities: 1+ edema bilateral lower extremities Dialysis Access:  [facility] without erythema/drainage. Maturing LUE AVF + thrill/bruit  Additional Objective Labs: Basic Metabolic Panel: Recent Labs  Lab 02/25/20 0443 02/26/20 0715 02/27/20 0329  NA 143 140 142  K 3.5 3.8 3.9  CL 104 102 103  CO2 25 28 25   GLUCOSE 84 164* 147*  BUN 28* 19 36*  CREATININE 4.47* 3.57* 4.56*  CALCIUM 8.3* 8.3* 8.2*  PHOS 4.7* 3.8 4.6   Liver Function Tests: Recent Labs  Lab 02/25/20 0443 02/26/20 0715 02/27/20 0329  ALBUMIN 2.3* 2.3* 2.3*   CBC: Recent Labs  Lab 02/21/20 1655 02/22/20 0212 02/24/20 1148 02/24/20 1148 02/25/20 0443 02/25/20 0443 02/26/20 0715 02/26/20 0715 02/27/20 0329 02/27/20 1706 02/28/20 0434  WBC 10.3   < > 8.4   < > 7.9   < > 9.1  --  8.6  --  9.8  NEUTROABS 7.8*  --   --   --   --   --   --   --   --   --   --   HGB 4.0*   < > 8.1*   < > 8.0*   < > 8.2*   < > 7.8* 9.6* 8.8*  HCT 14.1*   < > 26.5*   < > 26.8*   < > 28.5*   < > 26.3* 31.7* 28.2*  MCV 94.0   < > 93.6  --  95.4  --  97.6  --  95.3  --  92.2  PLT 370   < > 194   < > 204   < > 181  --  197  --  220   < > = values in this interval not displayed.   Blood Culture    Component Value Date/Time   SDES BLOOD RIGHT  ANTECUBITAL 01/31/2020 1158   SPECREQUEST  01/31/2020 1158    BOTTLES DRAWN AEROBIC AND ANAEROBIC Blood Culture results may not be optimal due to an inadequate volume of blood received in culture bottles   CULT  01/31/2020 1158    NO GROWTH 5 DAYS Performed at [facility], [place] [address], [place], [place] [address]    REPTSTATUS 02/05/2020 FINAL 01/31/2020 1158    CBG: Recent Labs  Lab 02/27/20 0721 02/27/20 1137 02/27/20 1627 02/27/20 2232 02/28/20 0725  GLUCAP 128* 164* 93 178* 151*    Studies/Results: VAS [place] ABI WITH/WO TBI  Result Date: 02/27/2020 LOWER EXTREMITY DOPPLER STUDY Indications: Peripheral artery disease.  Limitations: Today's exam was limited due to patient positioning, an open wound,              bandages and patient constant movement, poor patient cooperation. Performing Technologist: Theda Sers,  Greg Rvt  Examination Guidelines: A complete evaluation includes at minimum, Doppler waveform signals and systolic blood pressure reading at the level of bilateral brachial, anterior tibial, and posterior tibial arteries, when vessel segments are accessible. Bilateral testing is considered an integral part of a complete examination. Photoelectric Plethysmograph (PPG) waveforms and toe systolic pressure readings are included as required and additional duplex testing as needed. Limited examinations for reoccurring indications may be performed as noted.  ABI Findings: +--------+------------------+-----+----------+--------+ Right   Rt Pressure (mmHg)IndexWaveform  Comment  +--------+------------------+-----+----------+--------+ Brachial140                    triphasic          +--------+------------------+-----+----------+--------+ PTA     57                0.41 monophasic         +--------+------------------+-----+----------+--------+ DP      43                0.31 monophasic         +--------+------------------+-----+----------+--------+  +----+------------------+-----+----------+-------+ LeftLt Pressure (mmHg)IndexWaveform  Comment +----+------------------+-----+----------+-------+ PTA 93                0.66 monophasic        +----+------------------+-----+----------+-------+ DP  124               0.89 monophasic        +----+------------------+-----+----------+-------+ +-------+-----------+-----------+------------+------------+ ABI/TBIToday's ABIToday's TBIPrevious ABIPrevious TBI +-------+-----------+-----------+------------+------------+ Right  0.41                                           +-------+-----------+-----------+------------+------------+ Left   0.89                                           +-------+-----------+-----------+------------+------------+  Summary: Right: Resting right ankle-brachial index indicates severe right lower extremity arterial disease. Unable to obtain TBI due to constant patient movement. Values are likely unreliable due to constant patient movement and poor cooperation. Left: Resting left ankle-brachial index indicates mild left lower extremity arterial disease. Unable to obtain TBI due to great toe amputation. Values are likely unreliable due to constant patient movement and poor cooperation.  *See table(s) above for measurements and observations.  Electronically signed by Ruta Hinds MD on 02/27/2020 at 5:56:55 PM.   Final    VAS [place] LOWER EXTREMITY ARTERIAL DUPLEX  Result Date: 02/27/2020 LOWER EXTREMITY ARTERIAL DUPLEX STUDY Indications: Peripheral artery disease. High Risk Factors: Hypertension, hyperlipidemia.  Vascular Interventions: 02/06/2020 - ABDOMINAL AORTOGRAM W/LOWER EXTREMITY                         Bilateral                         PERIPHERAL VASCULAR INTERVENTION                         SFA                         PERIPHERAL VASCULAR ATHERECTOMY  SFA. Current ABI:            R 0.41 L 089 Limitations: Patient body habitus, patient tissue  properties, patient              positioning, constant patient movement, poor patient cooperation. Comparison Study: No prior studies. Performing Technologist: Oliver Hum RVT  Examination Guidelines: A complete evaluation includes B-mode imaging, spectral Doppler, color Doppler, and power Doppler as needed of all accessible portions of each vessel. Bilateral testing is considered an integral part of a complete examination. Limited examinations for reoccurring indications may be performed as noted.  +-----------+--------+-----+--------+----------+--------+ LEFT       PSV cm/sRatioStenosisWaveform  Comments +-----------+--------+-----+--------+----------+--------+ CFA Distal 143                  biphasic           +-----------+--------+-----+--------+----------+--------+ SFA Prox   99                   biphasic           +-----------+--------+-----+--------+----------+--------+ SFA Mid    101                  biphasic           +-----------+--------+-----+--------+----------+--------+ SFA Distal 79                   biphasic           +-----------+--------+-----+--------+----------+--------+ POP Prox   108                  biphasic           +-----------+--------+-----+--------+----------+--------+ POP Distal 162                  biphasic           +-----------+--------+-----+--------+----------+--------+ ATA Prox   32                   monophasic         +-----------+--------+-----+--------+----------+--------+ ATA Mid    13                   monophasic         +-----------+--------+-----+--------+----------+--------+ ATA Distal 13                   monophasic         +-----------+--------+-----+--------+----------+--------+ PTA Prox   66                   biphasic           +-----------+--------+-----+--------+----------+--------+ PTA Mid    58                   biphasic            +-----------+--------+-----+--------+----------+--------+ PTA Distal 74                   biphasic           +-----------+--------+-----+--------+----------+--------+ PERO Distal22                   monophasic         +-----------+--------+-----+--------+----------+--------+  Summary: Left: Biphasic waveforms are noted in common femoral, superficial femoral, popliteal, and posterior tibial arteries. Monophasic flow was noted in the visualized portions of the peroneal arteries, and anterior tibial arteries.  See table(s) above for measurements and observations. Electronically signed by Ruta Hinds MD on 02/27/2020  at 5:54:08 PM.    Final    Medications:  . sodium chloride   Intravenous Once  . allopurinol  100 mg Oral Daily  . aspirin EC  81 mg Oral Daily  . atorvastatin  40 mg Oral q1800  . buPROPion  150 mg Oral BID  . Chlorhexidine Gluconate Cloth  6 each Topical Q0600  . gabapentin  300 mg Oral QHS  . insulin aspart  0-5 Units Subcutaneous QHS  . insulin aspart  0-6 Units Subcutaneous TID WC  . latanoprost  1 drop Both Eyes QHS  . linaclotide  72 mcg Oral QAC breakfast  . multivitamin  1 tablet Oral QHS  . pantoprazole  40 mg Oral Q0600  . polyethylene glycol  17 g Oral Daily  . sucroferric oxyhydroxide  1,000 mg Oral TID WC    Dialysis Orders: [facility] --> lastoutpatientHD Sat 2/20 4:15hr, 400/800, EDW 118kg, 2K/3Ca, TDC + maturing AVF, no heparin - Micera 211mcg IV q 2 weeks (last 2/18 - Hgb 6/2 at time) - Calcitriol 35mcg PO q HD  Assessment/Plan: 1. Symptomatic anemia/GIB: Hgb 4 on admit with + FOBT. S/p6U PRBCs.UnderwentEGD/colonoscopy2/27 showed duodenal AVM, GAVE, and actively bleeding hepatic flexure AVM - all treated with clip and/or APC. Hgb 8.8. Pt denies blood in stool. 2. ESRD:Usual TTS schedule, K+ 3.9, next HD today.  3. Hypertension/volume:BP adequately controlled. Below EDW but has edema on exam, going for UF goal 3L today and will lower EDW  at discharge.  4. Anemiaof ESRD + ABLA: See above. Not due for ESA yet.  5. Metabolic bone disease:Ca 7.7/ corrected 9.6. Phos 4.6- stable. Continue binders. Ordered outpatient calcitriol.  6. CAD (Hx CABG) 7. PAD (s/p recent L great toe aputation): ABIs completed yesterday, vascular recommending outpatient follow up.  8.  T2DM - insulin per primary  9. Dispo: SNF pending insurance Niel Hummer, [place] 02/28/2020, 9:42 AM  [facility] Pager: [phone]

## 2020-02-28 NOTE — Progress Notes (Signed)
Physical Therapy Treatment Patient Details Name: [name] [name] MRN: [id number] DOB: [birth year] Today's Date: 02/28/2020    History of Present Illness [name] [name] is a 69 y.o. female with ESRD, CAD (Hx CABG), PVD (s/p L great toe amputation 02/07/20), T2DM, obesity, combined HF who is being admitted with symptomatic anemia. WGY:[id number], stroke, obesity, HTN, anemia, obesioty    PT Comments    Patient received in bed, very pleasant and motivated to participate with PT today. See below for mobility/assist levels. Able to progress gait distance a considerable amount however needed fairly constant cues to maintain heel WB L LE and tends to distract herself with conversation very easily, poor compliance to full heel WB even with intervention by PT. Easily fatigued as well. She was left up in the chair with all needs met this morning. Continue to recommend return to SNF for full return to functional independence.     Follow Up Recommendations  SNF     Equipment Recommendations  Rolling walker with 5" wheels    Recommendations for Other Services       Precautions / Restrictions Precautions Precautions: Fall Required Braces or Orthoses: Other Brace Other Brace: darco shoe Restrictions Weight Bearing Restrictions: No Other Position/Activity Restrictions: WB through heel with darco shoe    Mobility  Bed Mobility Overal bed mobility: Modified Independent                Transfers Overall transfer level: Needs assistance Equipment used: Rolling walker (2 wheeled) Transfers: Sit to/from Stand Sit to Stand: Supervision         General transfer comment: S for safety with RW, cues for hand placement and heel wt bearing  Ambulation/Gait Ambulation/Gait assistance: Supervision Gait Distance (Feet): 100 Feet(2f, 155f 1061fAssistive device: Rolling walker (2 wheeled) Gait Pattern/deviations: Step-through pattern;Decreased stride length;Wide base of support;Trunk  flexed;Antalgic;Decreased weight shift to left;Decreased stance time - left;Decreased step length - right Gait velocity: decreased   General Gait Details: gait distance much improved today, does continue to fatigue easily. Very talkative and internally distracted and needed ongoing cues for heel WB surgical LE, poor compliance with precautions   Stairs             Wheelchair Mobility    Modified Rankin (Stroke Patients Only)       Balance Overall balance assessment: Needs assistance Sitting-balance support: Feet supported Sitting balance-Leahy Scale: Good     Standing balance support: Bilateral upper extremity supported;During functional activity Standing balance-Leahy Scale: Fair Standing balance comment: reliant on external support                            Cognition Arousal/Alertness: Awake/alert Behavior During Therapy: WFL for tasks assessed/performed Overall Cognitive Status: Within Functional Limits for tasks assessed                                 General Comments: confirms that she does need darco shoe but reoprts it is back at the SNF; easily self-distracted by talking and needed repeated cues for precautions      Exercises      General Comments General comments (skin integrity, edema, etc.): VSS on RA      Pertinent Vitals/Pain Pain Assessment: Faces Pain Score: 0-No pain Faces Pain Scale: No hurt Pain Intervention(s): Limited activity within patient's tolerance;Monitored during session    Home Living  Prior Function            PT Goals (current goals can now be found in the care plan section) Acute Rehab PT Goals Patient Stated Goal: go back to rehab before going home PT Goal Formulation: With patient Time For Goal Achievement: 03/13/20 Potential to Achieve Goals: Good Progress towards PT goals: Progressing toward goals    Frequency    Min 2X/week      PT Plan Current plan  remains appropriate    Co-evaluation              AM-PAC PT "6 Clicks" Mobility   Outcome Measure  Help needed turning from your back to your side while in a flat bed without using bedrails?: None Help needed moving from lying on your back to sitting on the side of a flat bed without using bedrails?: None Help needed moving to and from a bed to a chair (including a wheelchair)?: A Little Help needed standing up from a chair using your arms (e.g., wheelchair or bedside chair)?: A Little Help needed to walk in hospital room?: A Little Help needed climbing 3-5 steps with a railing? : A Lot 6 Click Score: 19    End of Session   Activity Tolerance: Patient tolerated treatment well;Patient limited by fatigue Patient left: in chair;with call bell/phone within reach Nurse Communication: Mobility status PT Visit Diagnosis: Unsteadiness on feet (R26.81);Pain;Difficulty in walking, not elsewhere classified (R26.2) Pain - Right/Left: Left Pain - part of body: Ankle and joints of foot     Time: 4159-7331 PT Time Calculation (min) (ACUTE ONLY): 26 min  Charges:  $Gait Training: 8-22 mins(co-tx with OT)                     Ann Lions PT, DPT, PN1   Supplemental Physical Therapist [place]    Pager [phone] Acute Rehab Office [phone]

## 2020-02-28 NOTE — Progress Notes (Signed)
Report called to [facility], SNF,  Report was received by ryan. Will pass on to the next RN since patient is currently in Dialysis.

## 2020-02-28 NOTE — Plan of Care (Signed)
  Problem: Activity: Goal: Risk for activity intolerance will decrease Outcome: Progressing   

## 2020-02-28 NOTE — Progress Notes (Signed)
Family Medicine Teaching Service Daily Progress Note Intern Pager: [phone]  Patient name: [name] [name] Medical record number: [id number] Date of birth: 1951-03-05 Age: 69 y.o. Gender: female  Primary Care Provider: [facility]: GI, nephro Code Status: Full  Pt Overview and Major Events to Date:  2/23 admitted 2/27-EGD/colonoscopy performed.  Assessment and Plan: [name] Murphey Lindsayis a 69 y.o.femalepresenting with symptomatic anemia. PMH is significant forCAD s/p CABG, combined systolic and diastolic CHF, PVD, ESRD on HD (TTS), DM type II,s/paortogram and arthrectomy of left SFA andpopliteal arteries, s/p stent of L popliteal artery, s/p L great toe amputation2/2 DM and gangrene,hyperparathyroidism, asthma, polysubstance abuse.  Acute on chronicsymptomatic Anemia, h/o GI bleed Hgb 8.8 today, s/p 1U pRBCs yesterday. 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 EGD showed nonbleeding angiectasia treated with APC, non obstructing schatzki's ring, and 5cm hiatal hernia. Can recheck hgb on Thursday at dialysis. To HD today, then d/c to SNF. -GI recs: ADAT,repeat cbc after one week s/p d/c, GI outpatient follow-up in 4 weeks, resume PPI - f/u cbc, rfp.  -Transfusion threshold less than 8 -Protonix 40 mg daily -SCDs for DVT prophylaxis -Up with assistance -PT/OT following  ESRD on HD Chronic.  Stable.  Dialysis schedule Tuesdays, Thursdays, Saturdays.  Patient was dialyzed Saturday.  Right upper chest HD cath intact.  Revision of left cephalic vein AV fistula (02/21). To HD today, then d/c to SNF.  -Nephro following, appreciate recommendations -Strict I's and O -Daily weight -Monitor fluid status -RFP in a.m.  CAD s/p CABG CHF H/O PAF/A.flutter Chronic.  Stable.  Meds include ASA, Plavix (on hold in setting of GI bleed) and pravastatin.  Nitroglycerin as needed.  Denies any  chest pain or shortness of breath. -Nitroglycerin as needed -Pravastatin 20 mg, ASA 81 mg  daily  HTN BP's range from hypotensive to normotensive: 131/72 most recently, SBP range of 118-156 and DBP 61-93 over past 24 hours. -Monitor blood pressure - Can restart home medication  PAD s/p L great toe amputation Stable.  Aortogram with atherectomy of the left SFA and popliteal arteries as well as stenting of left popliteal artery (02/06/2020).  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 Dr. Donzetta Matters ordered Vascular US with ABIs, which were difficult due to patient noncooperation, R 0.41 and L 0.89, indicating severe R lower extremity artery disease. -Curbside for vascular: discontinue Plavix and ASA held given GI bleed; restart ASA 81mg  daily after colonoscopy -Patient to follow up with Dr. Donzetta Matters as an outpatient on 03/02/20 at 8:20 AM  DMT2 CBG sub 200 over past several days, ranged from 93-178 yesterday, 1U aspart given.  Home medications NovoLog and Humalog -Can restart home meds when she returns to SNF -Continue sensitive sliding scale insulin coverage -Monitor CBGs  Peripheral Neuropathy Chronic.  Stable.  Home medication gabapentin 300 mg nightly -Continue gabapentin  GERD Home medication Protonix p.o. -Start PO protonix 40 mg daily  Asthma Home medications include ProAir, Atrovent, and Dulera. -Continue Dulera 2puffs q4h PRN wheezing, albuterol 2 puff q6h PRN wheezing  Polysubstance use Tobacco use.  Currently on Wellbutrin 150 twice daily.  Patient also endorsed occasional cocaine use (07/20) -Encourage smoking cessation -Courage abstinence of cocaine -NicoDerm patch if patient requests  MDD, GAD Stable.Continue home medication Wellbutrin150 mg BID.  Gout Continue home meds:allopurinol 100 mg p.o. twice daily - Continue allopurinol 100 mg DAILY  Afib/flutter New onset noted at 09/2019 admission for GI bleed.  EKG NSR on admission. Not  anticoagulated due to h/o GI bleeds and frequent falls. CHADSVASc 8 with stroke risk >10%, HAS-BLED 5 suggesting alternative to anticoagulation.   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 RV with normal systolic function.  FEN/GI:NPO pending GI recommendations Prophylaxis:SCDsfor DVT ppx  Disposition: back to SNF.    Subjective:  Patient feels better today.  Would like to go back to her SNF.    Objective: Temp:  [97.8 F (36.6 C)-98.9 F (37.2 C)] (P) 98.9 F (37.2 C) (03/02 0551) Pulse Rate:  [83-101] (P) 98 (03/02 0551) Resp:  [17-18] 18 (03/01 1215) BP: (118-156)/(72-93) (P) 136/61 (03/02 0551) SpO2:  [98 %-100 %] (P) 100 % (03/02 0551) Physical Exam: General: Alert and oriented, sitting up in chair, working with PT Cardiovascular: Distant heart sounds, normal rate, normal rhythm. Respiratory: Lungs clear to auscultation bilaterally.  No crackles or wheezes. Abdomen: Soft, nontender to palpation.  Normal bowel sounds. Extremities: Left great toe status post amputation.  No pus, no erythema, old crusted blood present  Laboratory: Recent Labs  Lab 02/26/20 0715 02/26/20 0715 02/27/20 0329 02/27/20 1706 02/28/20 0434  WBC 9.1  --  8.6  --  9.8  HGB 8.2*   < > 7.8* 9.6* 8.8*  HCT 28.5*   < > 26.3* 31.7* 28.2*  PLT 181  --  197  --  220   < > = values in this interval not displayed.   Recent Labs  Lab 02/25/20 0443 02/26/20 0715 02/27/20 0329  NA 143 140 142  K 3.5 3.8 3.9  CL 104 102 103  CO2 25 28 25   BUN 28* 19 36*  CREATININE 4.47* 3.57* 4.56*  CALCIUM 8.3* 8.3* 8.2*  GLUCOSE 84 164* 147*     [name] Damme, MD 02/28/2020, 8:07 AM PGY-1, [facility] pager: [phone], text pages welcome

## 2020-02-28 NOTE — Discharge Instructions (Signed)
While in the hospital you were treated for Anemia, or low blood counts. You were having GI bleeding, which was repaired in a colonoscopy on 02/25/20.  Please have your hemoglobin checked (blood count) on Thursday, March 01, 2020, when you go to dialysis.  Please go see Dr. Donzetta Matters (vascular surgeon) on Friday, March 02, 2020, to examine the surgical site on your L foot.  Please follow up with your primary care provider for your other chronic problems at your earliest convenience.    Anemia  Anemia is a condition in which you do not have enough red blood cells or hemoglobin. Hemoglobin is a substance in red blood cells that carries oxygen. When you do not have enough red blood cells or hemoglobin (are anemic), your body cannot get enough oxygen and your organs may not work properly. As a result, you may feel very tired or have other problems. What are the causes? Common causes of anemia include:  Excessive bleeding. Anemia can be caused by excessive bleeding inside or outside the body, including bleeding from the intestine or from periods in women.  Poor nutrition.  Long-lasting (chronic) kidney, thyroid, and liver disease.  Bone marrow disorders.  Cancer and treatments for cancer.  HIV (human immunodeficiency virus) and AIDS (acquired immunodeficiency syndrome).  Treatments for HIV and AIDS.  Spleen problems.  Blood disorders.  Infections, medicines, and autoimmune disorders that destroy red blood cells. What are the signs or symptoms? Symptoms of this condition include:  Minor weakness.  Dizziness.  Headache.  Feeling heartbeats that are irregular or faster than normal (palpitations).  Shortness of breath, especially with exercise.  Paleness.  Cold sensitivity.  Indigestion.  Nausea.  Difficulty sleeping.  Difficulty concentrating. Symptoms may occur suddenly or develop slowly. If your anemia is mild, you may not have symptoms. How is this diagnosed? This  condition is diagnosed based on:  Blood tests.  Your medical history.  A physical exam.  Bone marrow biopsy. Your health care provider may also check your stool (feces) for blood and may do additional testing to look for the cause of your bleeding. You may also have other tests, including:  Imaging tests, such as a CT scan or MRI.  Endoscopy.  Colonoscopy. How is this treated? Treatment for this condition depends on the cause. If you continue to lose a lot of blood, you may need to be treated at a hospital. Treatment may include:  Taking supplements of iron, vitamin W25, or folic acid.  Taking a hormone medicine (erythropoietin) that can help to stimulate red blood cell growth.  Having a blood transfusion. This may be needed if you lose a lot of blood.  Making changes to your diet.  Having surgery to remove your spleen. Follow these instructions at home:  Take over-the-counter and prescription medicines only as told by your health care provider.  Take supplements only as told by your health care provider.  Follow any diet instructions that you were given.  Keep all follow-up visits as told by your health care provider. This is important. Contact a health care provider if:  You develop new bleeding anywhere in the body. Get help right away if:  You are very weak.  You are short of breath.  You have pain in your abdomen or chest.  You are dizzy or feel faint.  You have trouble concentrating.  You have bloody or black, tarry stools.  You vomit repeatedly or you vomit up blood. Summary  Anemia is a condition in which  you do not have enough red blood cells or enough of a substance in your red blood cells that carries oxygen (hemoglobin).  Symptoms may occur suddenly or develop slowly.  If your anemia is mild, you may not have symptoms.  This condition is diagnosed with blood tests as well as a medical history and physical exam. Other tests may be  needed.  Treatment for this condition depends on the cause of the anemia. This information is not intended to replace advice given to you by your health care provider. Make sure you discuss any questions you have with your health care provider. Document Revised: 11/27/2017 Document Reviewed: 01/16/2017 Elsevier Patient Education  [employer].

## 2020-02-28 NOTE — Progress Notes (Signed)
Occupational Therapy Treatment Patient Details Name: [name] [name] MRN: [id number] DOB: 27-Nov-1951 Today's Date: 02/28/2020    History of present illness [name] [name] is a 69 y.o. female with ESRD, CAD (Hx CABG), PVD (s/p L great toe amputation 02/07/20), T2DM, obesity, combined HF who is being admitted with symptomatic anemia. [name] [name], stroke, obesity, HTN, anemia, obesioty   OT comments  Pt making good progress with functional goals. Session focused on bathing, grooming/hygiene standing at sink, ADL mobility with RW, transfers to [facility], toileting and safety. OT will continue to follow acutely  Follow Up Recommendations  SNF    Equipment Recommendations  3 in 1 bedside commode    Recommendations for Other Services      Precautions / Restrictions Precautions Precautions: Fall Required Braces or Orthoses: Other Brace Other Brace: darco shoe Restrictions Weight Bearing Restrictions: No Other Position/Activity Restrictions: WB through heel with darco shoe       Mobility Bed Mobility Overal bed mobility: Modified Independent                Transfers Overall transfer level: Needs assistance Equipment used: Rolling walker (2 wheeled) Transfers: Sit to/from Stand Sit to Stand: Supervision         General transfer comment: S for safety with RW, cues for hand placement and heel wt bearing    Balance Overall balance assessment: Needs assistance Sitting-balance support: Feet supported Sitting balance-Leahy Scale: Good     Standing balance support: Bilateral upper extremity supported;During functional activity Standing balance-Leahy Scale: Fair Standing balance comment: reliant on external support                           ADL either performed or assessed with clinical judgement   ADL Overall ADL's : Needs assistance/impaired Eating/Feeding: Independent;Sitting   Grooming: Wash/dry face;Wash/dry hands;Supervision/safety;Set up;Standing    Upper Body Bathing: Set up;Sitting Upper Body Bathing Details (indicate cue type and reason): simulated Lower Body Bathing: Min guard;Sit to/from stand Lower Body Bathing Details (indicate cue type and reason): simulated         Toilet Transfer: Supervision/safety;Ambulation;RW;BSC;Cueing for safety   Toileting- Clothing Manipulation and Hygiene: Supervision/safety;Sit to/from stand       Functional mobility during ADLs: Supervision/safety;Rolling walker;Cueing for safety       Vision Patient Visual Report: No change from baseline     Perception     Praxis      Cognition Arousal/Alertness: Awake/alert Behavior During Therapy: WFL for tasks assessed/performed Overall Cognitive Status: Within Functional Limits for tasks assessed                                 General Comments: confirms that she does need darco shoe but reoprts it is back at the SNF; easily self-distracted by talking and needed repeated cues for precautions        Exercises     Shoulder Instructions       General Comments VSS on RA    Pertinent Vitals/ Pain       Pain Assessment: Faces Pain Score: 0-No pain Faces Pain Scale: No hurt Pain Intervention(s): Monitored during session;Repositioned  Home Living                                          Prior Functioning/Environment  Frequency  Min 2X/week        Progress Toward Goals  OT Goals(current goals can now be found in the care plan section)  Progress towards OT goals: Progressing toward goals  Acute Rehab OT Goals Patient Stated Goal: go back to rehab before going home  Plan Discharge plan remains appropriate    Co-evaluation                 AM-PAC OT "6 Clicks" Daily Activity     Outcome Measure   Help from another person eating meals?: None Help from another person taking care of personal grooming?: A Little Help from another person toileting, which includes using  toliet, bedpan, or urinal?: A Little Help from another person bathing (including washing, rinsing, drying)?: A Little Help from another person to put on and taking off regular upper body clothing?: None Help from another person to put on and taking off regular lower body clothing?: A Little 6 Click Score: 20    End of Session Equipment Utilized During Treatment: Rolling walker;Other (comment)(BSC)  OT Visit Diagnosis: Unsteadiness on feet (R26.81);Other abnormalities of gait and mobility (R26.89);Muscle weakness (generalized) (M62.81);Pain Pain - Right/Left: Left Pain - part of body: Ankle and joints of foot   Activity Tolerance Patient tolerated treatment well   Patient Left in chair;with call bell/phone within reach   Nurse Communication          Time: 6387-5643 OT Time Calculation (min): 25 min  Charges: OT General Charges $OT Visit: 1 Visit OT Treatments $Self Care/Home Management : 8-22 mins     Emmit Alexanders [facility] 02/28/2020, 11:03 AM

## 2020-03-02 ENCOUNTER — Encounter: Payer: Self-pay | Admitting: Vascular Surgery

## 2020-03-02 ENCOUNTER — Ambulatory Visit (INDEPENDENT_AMBULATORY_CARE_PROVIDER_SITE_OTHER): Payer: Self-pay | Admitting: Physician Assistant

## 2020-03-02 ENCOUNTER — Other Ambulatory Visit: Payer: Self-pay

## 2020-03-02 VITALS — BP 116/58 | HR 72 | Temp 97.3°F | Resp 20 | Ht 68.0 in | Wt 265.0 lb

## 2020-03-02 DIAGNOSIS — Z992 Dependence on renal dialysis: Secondary | ICD-10-CM

## 2020-03-02 DIAGNOSIS — N186 End stage renal disease: Secondary | ICD-10-CM

## 2020-03-02 NOTE — Progress Notes (Signed)
POST OPERATIVE OFFICE NOTE    CC:  F/u for surgery  HPI:  This is a 69 y.o. female who is s/p S/P left AV fistula superficialization/ revision.  Left GT amputation.  She denise fever and chills.  Left UE denise weakness or motor loss.    Allergies  Allergen Reactions  . Naproxen Rash    Current Outpatient Medications  Medication Sig Dispense Refill  . allopurinol (ZYLOPRIM) 100 MG tablet Take 1 tablet (100 mg total) by mouth daily. 30 tablet 0  . aspirin 81 MG EC tablet Take 1 tablet (81 mg total) by mouth daily. 30 tablet 0  . atorvastatin (LIPITOR) 40 MG tablet Take 1 tablet (40 mg total) by mouth daily at 6 PM. 30 tablet 0  . buPROPion (WELLBUTRIN SR) 150 MG 12 hr tablet Take 1 tablet (150 mg total) by mouth 2 (two) times daily. 60 tablet 0  . cetirizine (ZYRTEC) 10 MG tablet Take 10 mg by mouth daily as needed for allergies.     . ferrous sulfate 325 (65 FE) MG tablet Take 325 mg by mouth 2 (two) times daily with a meal.     . gabapentin (NEURONTIN) 300 MG capsule Take 1 capsule (300 mg total) by mouth at bedtime.    . insulin aspart (NOVOLOG) 100 UNIT/ML injection Inject 2 Units into the skin 3 (three) times daily with meals.    . latanoprost (XALATAN) 0.005 % ophthalmic solution Place 1 drop into both eyes at bedtime.    Marland Kitchen LINZESS 72 MCG capsule Take 72 mcg by mouth daily before breakfast.   3  . metoprolol tartrate (LOPRESSOR) 25 MG tablet Take 1 tablet (25 mg total) by mouth 2 (two) times daily.    . mometasone-formoterol (DULERA) 100-5 MCG/ACT AERO Inhale 2 puffs into the lungs every 4 (four) hours as needed for wheezing.    . multivitamin (RENA-VIT) TABS tablet Take 1 tablet by mouth at bedtime. 30 tablet 0  . nitroGLYCERIN (NITROSTAT) 0.4 MG SL tablet Place 1 tablet (0.4 mg total) under the tongue every 5 (five) minutes as needed for chest pain. 30 tablet 0  . oxyCODONE (OXY IR/ROXICODONE) 5 MG immediate release tablet Take 1 tablet (5 mg total) by mouth every 6 (six)  hours as needed for moderate pain. 20 tablet 0  . pantoprazole (PROTONIX) 40 MG tablet Take 1 tablet (40 mg total) by mouth daily. 30 tablet 0  . polyethylene glycol (MIRALAX) 17 g packet Take 17 g by mouth daily as needed for moderate constipation. 14 each 0  . PROAIR HFA 108 (90 Base) MCG/ACT inhaler Inhale 2 puffs into the lungs every 6 (six) hours as needed for wheezing.   3  . TUMS 500 MG chewable tablet Chew 2 tablets by mouth at bedtime.    . VELPHORO 500 MG chewable tablet Chew 2 tablets (1,000 mg total) by mouth 3 (three) times daily with meals. 90 tablet 0   No current facility-administered medications for this visit.     ROS:  See HPI  Physical Exam:  Left Foot GT toe incision  Doppler PT/peroneal  Left AV fistula incision healing well, palpable thrill Left UE N/M/sensation intact  Assessment/Planning: S/P left AV fistula superficialization/ revision Fistula may be accessed in 2 weeks.  Once the fistula is accessed well the [facility] can be removed.   Left GT incision healing well.  She will return in 2 weeks for exam and suture removal.  Elevation encouraged.  Cont. Daily Aspirin, Plavix  was stopped secondary to GI bleed.        Roxy Horseman PA-C [facility] [phone]  Clinic MD:  Fuller Plan 03/02/2020 9:37 AM

## 2020-03-15 ENCOUNTER — Emergency Department (HOSPITAL_COMMUNITY)
Admission: EM | Admit: 2020-03-15 | Discharge: 2020-03-16 | Disposition: A | Discharge status: Home / Self Care | Payer: Medicare (Managed Care) | Attending: Emergency Medicine | Admitting: Emergency Medicine

## 2020-03-15 ENCOUNTER — Encounter (HOSPITAL_COMMUNITY): Payer: Self-pay

## 2020-03-15 DIAGNOSIS — N186 End stage renal disease: Secondary | ICD-10-CM | POA: Insufficient documentation

## 2020-03-15 DIAGNOSIS — Z20822 Contact with and (suspected) exposure to covid-19: Secondary | ICD-10-CM | POA: Insufficient documentation

## 2020-03-15 DIAGNOSIS — Z992 Dependence on renal dialysis: Secondary | ICD-10-CM | POA: Diagnosis not present

## 2020-03-15 DIAGNOSIS — Z7982 Long term (current) use of aspirin: Secondary | ICD-10-CM | POA: Diagnosis not present

## 2020-03-15 DIAGNOSIS — Z79899 Other long term (current) drug therapy: Secondary | ICD-10-CM | POA: Diagnosis not present

## 2020-03-15 DIAGNOSIS — F17210 Nicotine dependence, cigarettes, uncomplicated: Secondary | ICD-10-CM | POA: Insufficient documentation

## 2020-03-15 DIAGNOSIS — Z794 Long term (current) use of insulin: Secondary | ICD-10-CM | POA: Diagnosis not present

## 2020-03-15 DIAGNOSIS — D631 Anemia in chronic kidney disease: Secondary | ICD-10-CM

## 2020-03-15 DIAGNOSIS — I132 Hypertensive heart and chronic kidney disease with heart failure and with stage 5 chronic kidney disease, or end stage renal disease: Secondary | ICD-10-CM | POA: Diagnosis not present

## 2020-03-15 DIAGNOSIS — I5042 Chronic combined systolic (congestive) and diastolic (congestive) heart failure: Secondary | ICD-10-CM | POA: Diagnosis not present

## 2020-03-15 DIAGNOSIS — E1122 Type 2 diabetes mellitus with diabetic chronic kidney disease: Secondary | ICD-10-CM | POA: Diagnosis not present

## 2020-03-15 DIAGNOSIS — N189 Chronic kidney disease, unspecified: Secondary | ICD-10-CM

## 2020-03-15 DIAGNOSIS — R531 Weakness: Secondary | ICD-10-CM | POA: Diagnosis present

## 2020-03-15 LAB — BASIC METABOLIC PANEL WITH GFR
Anion gap: 17 — ABNORMAL HIGH (ref 5–15)
BUN: 55 mg/dL — ABNORMAL HIGH (ref 8–23)
CO2: 24 mmol/L (ref 22–32)
Calcium: 7.6 mg/dL — ABNORMAL LOW (ref 8.9–10.3)
Chloride: 96 mmol/L — ABNORMAL LOW (ref 98–111)
Creatinine, Ser: 5.46 mg/dL — ABNORMAL HIGH (ref 0.44–1.00)
GFR calc Af Amer: 9 mL/min — ABNORMAL LOW
GFR calc non Af Amer: 7 mL/min — ABNORMAL LOW
Glucose, Bld: 148 mg/dL — ABNORMAL HIGH (ref 70–99)
Potassium: 4.2 mmol/L (ref 3.5–5.1)
Sodium: 137 mmol/L (ref 135–145)

## 2020-03-15 LAB — CBC
HCT: 23.1 % — ABNORMAL LOW (ref 36.0–46.0)
Hemoglobin: 7 g/dL — ABNORMAL LOW (ref 12.0–15.0)
MCH: 27.7 pg (ref 26.0–34.0)
MCHC: 30.3 g/dL (ref 30.0–36.0)
MCV: 91.3 fL (ref 80.0–100.0)
Platelets: 224 10*3/uL (ref 150–400)
RBC: 2.53 MIL/uL — ABNORMAL LOW (ref 3.87–5.11)
RDW: 18 % — ABNORMAL HIGH (ref 11.5–15.5)
WBC: 7.3 10*3/uL (ref 4.0–10.5)
nRBC: 0 % (ref 0.0–0.2)

## 2020-03-15 MED ORDER — SODIUM CHLORIDE 0.9% FLUSH
3.0000 mL | Freq: Once | INTRAVENOUS | Status: AC
  Administered 2020-03-15: 3 mL via INTRAVENOUS

## 2020-03-15 MED ORDER — ACETAMINOPHEN 500 MG PO TABS
1000.0000 mg | ORAL_TABLET | Freq: Once | ORAL | Status: AC
  Administered 2020-03-15: 1000 mg via ORAL
  Filled 2020-03-15: qty 2

## 2020-03-15 NOTE — ED Triage Notes (Signed)
Pt comes from [facility] via [facility] for weakness for the past 3 days and AMS, pt is AxO x 4, missed dialysis on Tuesday.

## 2020-03-15 NOTE — ED Provider Notes (Signed)
[facility] EMERGENCY DEPARTMENT Provider Note   CSN: [id number] Arrival date & time: 03/15/20  2037     History Chief Complaint  Patient presents with  . Weakness   [name] [name] is a 69 y.o. female.  Ms. 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  Patient sister states that the living facility called her to let her know that [name] was altered, and did not respond to questions appropriately.  They also stated that the patient's eyes were rolling to the back of her head and appeared acutely ill, is far from her baseline.  She states this was around 1 PM when the patient came back from dialysis.   The patient states that she disagrees with this assessment, and does not understand why she was sent to the hospital.  She states that she missed her dialysis session on Tuesday because she did not feel like going.  However, she had dialysis earlier today and it went well.  Patient is complaining of some left jaw pain, but no other complaints at this time.  Patient is alert and oriented and answers all questions appropriately.       Past Medical History:  Diagnosis Date  . Abscess   . Acute blood loss anemia 08/17/2019  . Acute pulmonary edema (HCC)   . Acute respiratory failure ([place]) 10/18/2014  . Acute respiratory failure with hypoxia and hypercapnia ([place]) 06/01/2019  . AMS (altered mental status) 01/01/2020  . Anemia 08/2016  . Angiodysplasia of colon   . Arthritis of left shoulder region 03/23/2013  . Bleeding gastrointestinal   . Cardiomegaly 05/2019  . Chest pain 04/17/2016  . Chronic combined systolic and  diastolic CHF (congestive heart failure) (HCC)    a. EF 40-45%, mild LVH, mid apicalanteroseptal and apical HK.  . CKD (chronic kidney disease), stage III   . Cocaine abuse ([place])    crack cocaine heavily until 2008 then sporadic use since then  . Coronary artery disease    a. 06/2012 NSTEMI/CABG x 3 (LIMA->LAD, VG->OM2, VG->LCX);  b. 04/2015 MV: EF<30%, mid ant, apicalanterior, apical infarct;  c. 04/2015 Cath: LM nl, LAD 90p, LCX 34m, OM1 min irregs, RCA mild dzs, LIMA->LAD nl w/ dist LAD dzs, VG->OM2 nl, VG->LCX nl-->Med Rx.  . CVA (cerebral infarction)    a. right internal capsule stroke in 12/2006  . Demand ischemia ([place])   . Diabetes mellitus    diagnosed in 2008  . Elevated troponin 04/27/2019  . Essential hypertension   . Glaucoma   . Gout   . Heme positive stool   . HFrEF (heart failure with reduced ejection fraction) ([place])   . Hyperlipidemia   . Hyperparathyroidism, secondary renal ([place])   . Hypertensive crisis 06/02/2019  . Left-sided sensory deficit present   . Lobar pneumonia ([place]) 04/27/2019  . Obesity, morbid ([place])   . Pneumonia   . Pulmonary edema 05/2019  . PVD (peripheral vascular disease) ([place])    a. 06/2012 ABI's: R - 0.73, L - 0.71.  Marland Kitchen Renal mass, right   . Sepsis ([place]) 04/27/2019  . Shortness of breath dyspnea   .  Stroke ([place])   . Thrombocytosis ([place]) 04/17/2016  . Thyroid nodule    FNA in 3818 showed follicular cells but not definate neoplasm  . Tobacco abuse   . Trichomoniasis    Patient Active Problem List   Diagnosis Date Noted  . AVM (arteriovenous malformation) of duodenum, acquired   . GAVE (gastric antral vascular ectasia)   . Polyp of hepatic flexure of colon   . Acute post-hemorrhagic anemia   . Dialysis patient ([place])   . Gastroesophageal reflux disease without esophagitis   . AVM (arteriovenous malformation) of colon   . Symptomatic anemia 02/21/2020  . AMS (altered mental status) 01/01/2020  . Recurrent falls 10/09/2019  . Generalized weakness  10/09/2019  . Unspecified atrial fibrillation ([place]) 10/08/2019  . Acute GI bleeding 10/05/2019  . Hyperkalemia   . Depression   . Esophageal stricture   . Diabetic gastroparesis ([place])   . Upper GI bleed 08/16/2019  . Elevated troponin 04/27/2019  . Hiatal hernia   . Iron deficiency anemia   . Microcytic anemia 02/05/2016  . Acute on chronic renal failure ([place])   . CAD in native artery   . Substance abuse ([place])   . Chronic combined systolic and diastolic CHF (congestive heart failure) ([place])   . ESRD (end stage renal disease) ([place])   . Obesity, Class III, BMI 40-49.9 (morbid obesity) ([place])   . Type 2 diabetes mellitus with chronic kidney disease on chronic dialysis, with long-term current use of insulin ([place]) 10/18/2014  . S/P CABG (coronary artery bypass graft) 09/21/2013  . Arthritis of right shoulder region 03/23/2013  . Tobacco abuse   . Coronary artery disease   . NSTEMI (non-ST elevated myocardial infarction) ([place]) 06/29/2012  . History of cocaine abuse ([place]) 06/29/2012  . Essential hypertension 06/29/2012  . Glaucoma   . Hyperlipidemia   . History of CVA (cerebrovascular accident)    Past Surgical History:  Procedure Laterality Date  . ABDOMINAL AORTOGRAM W/LOWER EXTREMITY Bilateral 02/06/2020   Procedure: ABDOMINAL AORTOGRAM W/LOWER EXTREMITY;  Surgeon: Waynetta Sandy, MD;  Location: [place] CV [facility];  Service: Cardiovascular;  Laterality: Bilateral;  . AMPUTATION Left 02/07/2020   Procedure: AMPUTATION DIGIT - LEFT GREAT TOE;  Surgeon: Waynetta Sandy, MD;  Location: [place];  Service: Vascular;  Laterality: Left;  . AV FISTULA PLACEMENT Left 08/25/2019   Procedure: ARTERIOVENOUS (AV) BRACHIOCEPHALIC FISTULA CREATION;  Surgeon: Waynetta Sandy, MD;  Location: [place];  Service: Vascular;  Laterality: Left;  . CARDIAC CATHETERIZATION    . CARDIAC CATHETERIZATION N/A 05/17/2015   Procedure: Left Heart Cath and Cors/Grafts Angiography;  Surgeon:  [name], MD;  Location: [place] [facility];  Service: Cardiovascular;  Laterality: N/A;  . COLONOSCOPY Left 02/25/2020   Procedure: COLONOSCOPY;  Surgeon: Lavena Bullion, DO;  Location: [place];  Service: Gastroenterology;  Laterality: Left;  . COLONOSCOPY WITH PROPOFOL N/A 04/21/2016   Procedure: COLONOSCOPY WITH PROPOFOL;  Surgeon: Irene Shipper, MD;  Location: [place];  Service: Endoscopy;  Laterality: N/A;  . CORONARY ARTERY BYPASS GRAFT  07/09/2012   Procedure: CORONARY ARTERY BYPASS GRAFTING (CABG);  Surgeon: Ivin Poot, MD;  Location: [place];  Service: Open Heart Surgery;  Laterality: N/A;  . ENTEROSCOPY N/A 10/07/2019   Procedure: ENTEROSCOPY;  Surgeon: Jackquline Denmark, MD;  Location: [facility] ENDOSCOPY;  Service: Endoscopy;  Laterality: N/A;  . ENTEROSCOPY Left 02/25/2020   Procedure: ENTEROSCOPY;  Surgeon: Lavena Bullion, DO;  Location: [place];  Service: Gastroenterology;  Laterality: Left;  . ESOPHAGOGASTRODUODENOSCOPY N/A 04/20/2016   Procedure: ESOPHAGOGASTRODUODENOSCOPY (EGD);  Surgeon: Gatha Mayer, MD;  Location: [facility] ENDOSCOPY;  Service: Endoscopy;  Laterality: N/A;  . ESOPHAGOGASTRODUODENOSCOPY (EGD) WITH PROPOFOL N/A 08/17/2019   Procedure: ESOPHAGOGASTRODUODENOSCOPY (EGD) WITH PROPOFOL;  Surgeon: Irene Shipper, MD;  Location: [facility] ENDOSCOPY;  Service: Endoscopy;  Laterality: N/A;  . HEMOSTASIS CLIP PLACEMENT  02/25/2020   Procedure: HEMOSTASIS CLIP PLACEMENT;  Surgeon: Lavena Bullion, DO;  Location: [place];  Service: Gastroenterology;;  . HOT HEMOSTASIS N/A 02/25/2020   Procedure: HOT HEMOSTASIS (ARGON PLASMA COAGULATION/BICAP);  Surgeon: Lavena Bullion, DO;  Location: [facility] ENDOSCOPY;  Service: Gastroenterology;  Laterality: N/A;  . INSERTION OF DIALYSIS CATHETER Right 08/25/2019   Procedure: INSERTION OF DIALYSIS CATHETER;  Surgeon: Waynetta Sandy, MD;  Location: [place];  Service: Vascular;  Laterality: Right;  . IR FLUORO GUIDE CV LINE  RIGHT  08/19/2019  . IR US GUIDE VASC ACCESS RIGHT  08/19/2019  . LEFT HEART CATHETERIZATION WITH CORONARY ANGIOGRAM N/A 06/29/2012   Procedure: LEFT HEART CATHETERIZATION WITH CORONARY ANGIOGRAM;  Surgeon: Peter M Martinique, MD;  Location: [facility] CATH LAB;  Service: Cardiovascular;  Laterality: N/A;  . PERIPHERAL VASCULAR ATHERECTOMY Left 02/06/2020   Procedure: PERIPHERAL VASCULAR ATHERECTOMY;  Surgeon: Waynetta Sandy, MD;  Location: [place] CV LAB;  Service: Cardiovascular;  Laterality: Left;  SFA  . PERIPHERAL VASCULAR INTERVENTION Left 02/06/2020   Procedure: PERIPHERAL VASCULAR INTERVENTION;  Surgeon: Waynetta Sandy, MD;  Location: [place] CV LAB;  Service: Cardiovascular;  Laterality: Left;  SFA  . REVISON OF ARTERIOVENOUS FISTULA Left 02/07/2020   Procedure: REVISON OF ARTERIOVENOUS FISTULA;  Surgeon: Waynetta Sandy, MD;  Location: [place];  Service: Vascular;  Laterality: Left;  . STERNAL WOUND DEBRIDEMENT  08/17/2012   Procedure: STERNAL WOUND DEBRIDEMENT;  Surgeon: Ivin Poot, MD;  Location: [place];  Service: Thoracic;  Laterality: N/A;  wound vac application  . STERNAL WOUND DEBRIDEMENT  08/24/2012   Procedure: STERNAL WOUND DEBRIDEMENT;  Surgeon: Ivin Poot, MD;  Location: [place];  Service: Thoracic;  Laterality: N/A;  . STERNAL WOUND DEBRIDEMENT  09/01/2012   Procedure: STERNAL WOUND DEBRIDEMENT;  Surgeon: Ivin Poot, MD;  Location: [place];  Service: Thoracic;  Laterality: N/A;  . STERNAL WOUND DEBRIDEMENT  09/20/2012   Procedure: STERNAL WOUND DEBRIDEMENT;  Surgeon: Ivin Poot, MD;  Location: [facility] OR;  Service: Thoracic;  Laterality: N/A;  wound vac change  . SUBMUCOSAL TATTOO INJECTION  10/07/2019   Procedure: SUBMUCOSAL TATTOO INJECTION;  Surgeon: Jackquline Denmark, MD;  Location: [facility] ENDOSCOPY;  Service: Endoscopy;;    OB History   No obstetric history on file.    Family History  Problem Relation Age of Onset  . Diabetes Mother   . Hypertension  Mother   . Cancer Mother   . Hyperlipidemia Father   . Hypertension Father   . Kidney disease Father   . Gout Father   . Cerebrovascular Accident Father   . Other Other        no known family CAD   Social History   Tobacco Use  . Smoking status: Current Some Day Smoker    Packs/day: 0.25    Years: 50.00    Pack years: 12.50    Types: Cigarettes  . Smokeless tobacco: Never Used  Substance Use Topics  . Alcohol use: No    Alcohol/week: 0.0 standard drinks  . Drug use: Yes    Types: Cocaine  Comment: pt denies at current time   Home Medications Prior to Admission medications   Medication Sig Start Date End Date Taking? Authorizing Provider  allopurinol (ZYLOPRIM) 100 MG tablet Take 1 tablet (100 mg total) by mouth daily. 02/28/20   Gladys Damme, MD  aspirin 81 MG EC tablet Take 1 tablet (81 mg total) by mouth daily. 02/28/20   Gladys Damme, MD  atorvastatin (LIPITOR) 40 MG tablet Take 1 tablet (40 mg total) by mouth daily at 6 PM. 02/28/20   Gladys Damme, MD  buPROPion [facility] SR) 150 MG 12 hr tablet Take 1 tablet (150 mg total) by mouth 2 (two) times daily. 01/16/16   Mikhail, Velta Addison, DO  cetirizine (ZYRTEC) 10 MG tablet Take 10 mg by mouth daily as needed for allergies.     [provider]  ferrous sulfate 325 (65 FE) MG tablet Take 325 mg by mouth 2 (two) times daily with a meal.     [provider]  gabapentin (NEURONTIN) 300 MG capsule Take 1 capsule (300 mg total) by mouth at bedtime. 02/10/20   Aline August, MD  insulin aspart (NOVOLOG) 100 UNIT/ML injection Inject 2 Units into the skin 3 (three) times daily with meals. 02/10/20   Aline August, MD  latanoprost (XALATAN) 0.005 % ophthalmic solution Place 1 drop into both eyes at bedtime.    [provider]  LINZESS 72 MCG capsule Take 72 mcg by mouth daily before breakfast.  11/21/17   [provider]  metoprolol tartrate (LOPRESSOR) 25 MG tablet Take 1 tablet (25 mg total)  by mouth 2 (two) times daily. 02/10/20   Aline August, MD  mometasone-formoterol (DULERA) 100-5 MCG/ACT AERO Inhale 2 puffs into the lungs every 4 (four) hours as needed for wheezing.    [provider]  multivitamin (RENA-VIT) TABS tablet Take 1 tablet by mouth at bedtime. 02/10/20   Aline August, MD  nitroGLYCERIN (NITROSTAT) 0.4 MG SL tablet Place 1 tablet (0.4 mg total) under the tongue every 5 (five) minutes as needed for chest pain. 04/30/19   [name], MD  oxyCODONE (OXY IR/ROXICODONE) 5 MG immediate release tablet Take 1 tablet (5 mg total) by mouth every 6 (six) hours as needed for moderate pain. 02/10/20   Rhyne, Hulen Shouts, PA-C  pantoprazole (PROTONIX) 40 MG tablet Take 1 tablet (40 mg total) by mouth daily. 02/28/20 03/29/20  Gladys Damme, MD  polyethylene glycol (MIRALAX) 17 g packet Take 17 g by mouth daily as needed for moderate constipation. 02/10/20   Aline August, MD  PROAIR HFA 108 (90 Base) MCG/ACT inhaler Inhale 2 puffs into the lungs every 6 (six) hours as needed for wheezing.  11/17/17   [provider]  TUMS 500 MG chewable tablet Chew 2 tablets by mouth at bedtime. 01/19/20   [provider]  VELPHORO 500 MG chewable tablet Chew 2 tablets (1,000 mg total) by mouth 3 (three) times daily with meals. 02/10/20   Aline August, MD   Allergies    Naproxen  Review of Systems   Review of Systems  HENT:       L jaw pain  All other systems reviewed and are negative.  Physical Exam Updated Vital Signs BP 100/66 (BP Location: Right Arm)   Pulse 81   Temp 98.8 F (37.1 C) (Oral)   Resp 20   SpO2 100%   Physical Exam Vitals reviewed.  Constitutional:      General: She is not in acute distress.    Appearance:  Normal appearance. She is obese. She is not ill-appearing, toxic-appearing or diaphoretic.  HENT:     Head: Normocephalic.     Mouth/Throat:     Pharynx: Oropharynx is clear. No oropharyngeal exudate or posterior oropharyngeal erythema.    Eyes:     General: No scleral icterus. Cardiovascular:     Rate and Rhythm: Normal rate and regular rhythm.     Pulses: Normal pulses.     Heart sounds: No murmur. No friction rub. No gallop.   Pulmonary:     Effort: Pulmonary effort is normal. No respiratory distress.     Breath sounds: Normal breath sounds. No wheezing or rales.  Abdominal:     General: Abdomen is flat. Bowel sounds are normal. There is no distension.     Palpations: Abdomen is soft.     Tenderness: There is no abdominal tenderness. There is no guarding.  Musculoskeletal:     Right lower leg: No edema.     Left lower leg: No edema.     Comments: L big toe amputation with poor skin healing. Dry, and nonpurulent   Skin:    Findings: Lesion (scabbed lesions on the L shin) present. No erythema.  Neurological:     General: No focal deficit present.     Mental Status: She is alert and oriented to person, place, and time.     Cranial Nerves: No cranial nerve deficit.     Sensory: No sensory deficit.  Psychiatric:        Mood and Affect: Mood normal.    ED Results / Procedures / Treatments   Labs (all labs ordered are listed, but only abnormal results are displayed) Labs Reviewed  BASIC METABOLIC PANEL  CBC  URINALYSIS, ROUTINE W REFLEX MICROSCOPIC  CBG MONITORING, ED   EKG EKG Interpretation  Date/Time:  Thursday March 15 2020 21:31:33 EDT Ventricular Rate:  80 PR Interval:  164 QRS Duration: 96 QT Interval:  422 QTC Calculation: 486 R Axis:   15 Text Interpretation: Normal sinus rhythm Minimal voltage criteria for LVH, may be normal variant ( Cornell product ) Anterior infarct , age undetermined Abnormal ECG Confirmed by Quintella Reichert [phone]) on 03/15/2020 9:49:25 PM   Radiology No results found.  Procedures Procedures (including critical care time)  Medications Ordered in ED Medications  sodium chloride flush (NS) 0.9 % injection 3 mL (has no administration in time range)   ED Course  I  have reviewed the triage vital signs and the nursing notes.  Pertinent labs & imaging results that were available during my care of the patient were reviewed by me and considered in my medical decision making (see chart for details).  Clinical Course as of Mar 21 1549  Fri Mar 16, 2020  0025 Family medicine with the patient.   [AA]    Clinical Course User Index [AA] Earlene Plater, MD   MDM Rules/Calculators/A&P                      The patient reportedly was not acting herself, but is alert and oriented x4 while in the ED. Pt has some L sided TMJ tenderness but otherwise exam unremarkable. Vital signs are stable. Hgb low at 7.0. Pt will likely need admission for monitoring.   Final Clinical Impression(s) / ED Diagnoses Final diagnoses:  None   Rx / DC Orders ED Discharge Orders    None       Earlene Plater, MD 03/21/20 1550  Quintella Reichert, MD 03/23/20 [phone]

## 2020-03-15 NOTE — ED Notes (Signed)
Aletta Edouard sister [phone] would like an update

## 2020-03-16 DIAGNOSIS — I132 Hypertensive heart and chronic kidney disease with heart failure and with stage 5 chronic kidney disease, or end stage renal disease: Secondary | ICD-10-CM | POA: Diagnosis not present

## 2020-03-16 LAB — PREPARE RBC (CROSSMATCH)

## 2020-03-16 LAB — HEMOGLOBIN AND HEMATOCRIT, BLOOD
HCT: 25.8 % — ABNORMAL LOW (ref 36.0–46.0)
Hemoglobin: 8 g/dL — ABNORMAL LOW (ref 12.0–15.0)

## 2020-03-16 LAB — SARS CORONAVIRUS 2 (TAT 6-24 HRS): SARS Coronavirus 2: NEGATIVE

## 2020-03-16 MED ORDER — SODIUM CHLORIDE 0.9% IV SOLUTION: Freq: Once | INTRAVENOUS | Status: AC

## 2020-03-16 NOTE — ED Notes (Signed)
1st unit PRBC infusing at 250 ml/hr . , IV site unremarkable , no adverse effect , afebrile , VSS , respirations unlabored.

## 2020-03-16 NOTE — ED Notes (Signed)
1st unit PRBC infusing at 125 ml/hr. IV site intact , respirations unlabored , no adverse reaction noted .

## 2020-03-16 NOTE — ED Notes (Signed)
Report given to [facility] LPN at [place] [facility] on patient's discharge [place] of care . [facility] notified to transport patient .

## 2020-03-16 NOTE — ED Notes (Signed)
[facility] called to transport pt 

## 2020-03-16 NOTE — ED Notes (Signed)
Pt up to bathroom.

## 2020-03-16 NOTE — ED Notes (Signed)
1st unit PRBC completed with no adverse effect , IV site intact , afebrile / respirations unlabored.  

## 2020-03-16 NOTE — ED Notes (Signed)
Pt verbalized understanding of discharge instructions. [facility] transporting pt back to [facility].

## 2020-03-16 NOTE — Progress Notes (Signed)
PROGRESS NOTE:   Subjective:  Patient was seen in the Emergency Department and evaluated for admission. States that she was reported to be very confused at her nursing home, staff called [phone]. Patient is alert and oriented in the ED to person, place, year and situation. Review of symptoms is pan-negative except that patient has felt a little more tired today (Thursday). She was found to have acute on chronic anemia with hemoglobin of 7 (baseline hemoglobin appears to be 8.5-9).   Patient was last admitted to the hospital 02/21/2020 when she had been having frank blood per rectum (had recently had revision of left arm AVF, was on aspirin and plavix), was found to have hemoglobin of 4, was transfused total of 4 units with improvement of hemoglobin. GI was consulted, EGD and colonoscopy were performed, and a "single actively bleeding colonic angiectasia was successfully treated with hemostatic clip".   Today in the ED the patient is seen lying flat on her hospital bed in the hallway. She reports feeling a little bit tired today (had her HD this morning) but denies any abdominal pains, bowel changes, bright red blood per rectum, or dark tarry stools. Denies shortness of breath, headaches, vision changes, or weakness.  Objective: Blood pressure 100/66, pulse 81, temperature 98.8 F (37.1 C), temperature source Oral, resp. rate 20, SpO2 100 %.  Physical Exam:  General: pleasant patient, nontoxic appearing Cardiac: RRR S1S2 present, no murmurs Respiratory: CTA bilaterally Abd: soft, nontender Extremities: 1+ pitting edema to bilateral lower extremities, left great toe amputation well-healing with sutures in place  Assessment:  [name] [name] is a 69 year old female with ESRD on HD, acute on chronic anemia, and hx of CVA, CAD s/p CABG, T2DM, HTN, CHF, and GERD.    Plan:  Symptomatic anemia: patient reports fatigue more than usual today after her HD session, Hemoglobin 7, no source of bleeding identified.  Nephrology consulted, no need for dialysis at this point, agree patient would benefit from blood and could follow up outpatient. Attending (Dr. Erin Hearing) agrees with this plan.   - Type and screen ordered - Order 1u PRBC - follow up post-transfusion H/H - If H/H improved can discharge home with outpatient follow up   Milus Banister, Johnson, PGY-2 03/16/2020 2:21 AM

## 2020-03-16 NOTE — ED Provider Notes (Signed)
I assumed care of this patient.  Please see prior provider note for further details of Hx, PE.  Briefly patient is a 69 y.o. female who presented symptomatic anemia. Initial plan to admit for transfusion, but FM would like to transfuse in ED and DC.  1U given. Patient reports feeling better.  The patient appears reasonably screened and/or stabilized for discharge and I doubt any other medical condition or other [facility] requiring further screening, evaluation, or treatment in the ED at this time prior to discharge. Safe for discharge with strict return precautions.  Disposition: Discharge  Condition: Good  I have discussed the results, Dx and Tx plan with the patient/family who expressed understanding and agree(s) with the plan. Discharge instructions discussed at length. The patient/family was given strict return precautions who verbalized understanding of the instructions. No further questions at time of discharge.    ED Discharge Orders    None       Follow Up: [facility] [place] [address] [phone]  Schedule an appointment as soon as possible for a visit            Daishon Chui, Grayce Sessions, MD 03/16/20 [phone]

## 2020-03-17 LAB — TYPE AND SCREEN
ABO/RH(D): O POS
Antibody Screen: NEGATIVE
Unit division: 0

## 2020-03-17 LAB — BPAM RBC
Blood Product Expiration Date: 202103232359
ISSUE DATE / TIME: 202103190304
Unit Type and Rh: 9500

## 2020-04-03 ENCOUNTER — Inpatient Hospital Stay (HOSPITAL_COMMUNITY)
Admission: EM | Admit: 2020-04-03 | Discharge: 2020-04-19 | DRG: 853 | Disposition: A | Discharge status: Skilled Nursing Facility | Payer: Medicare (Managed Care) | Source: Skilled Nursing Facility | Attending: Internal Medicine | Admitting: Internal Medicine

## 2020-04-03 ENCOUNTER — Encounter (HOSPITAL_COMMUNITY): Payer: Self-pay

## 2020-04-03 ENCOUNTER — Other Ambulatory Visit: Payer: Self-pay

## 2020-04-03 DIAGNOSIS — H409 Unspecified glaucoma: Secondary | ICD-10-CM | POA: Diagnosis present

## 2020-04-03 DIAGNOSIS — M8610 Other acute osteomyelitis, unspecified site: Secondary | ICD-10-CM | POA: Diagnosis present

## 2020-04-03 DIAGNOSIS — I480 Paroxysmal atrial fibrillation: Secondary | ICD-10-CM | POA: Diagnosis present

## 2020-04-03 DIAGNOSIS — M109 Gout, unspecified: Secondary | ICD-10-CM | POA: Diagnosis present

## 2020-04-03 DIAGNOSIS — Z992 Dependence on renal dialysis: Secondary | ICD-10-CM

## 2020-04-03 DIAGNOSIS — Z794 Long term (current) use of insulin: Secondary | ICD-10-CM

## 2020-04-03 DIAGNOSIS — L97519 Non-pressure chronic ulcer of other part of right foot with unspecified severity: Secondary | ICD-10-CM | POA: Diagnosis present

## 2020-04-03 DIAGNOSIS — Z823 Family history of stroke: Secondary | ICD-10-CM

## 2020-04-03 DIAGNOSIS — I5042 Chronic combined systolic (congestive) and diastolic (congestive) heart failure: Secondary | ICD-10-CM | POA: Diagnosis not present

## 2020-04-03 DIAGNOSIS — Z89412 Acquired absence of left great toe: Secondary | ICD-10-CM

## 2020-04-03 DIAGNOSIS — L03116 Cellulitis of left lower limb: Secondary | ICD-10-CM | POA: Diagnosis present

## 2020-04-03 DIAGNOSIS — D631 Anemia in chronic kidney disease: Secondary | ICD-10-CM | POA: Diagnosis present

## 2020-04-03 DIAGNOSIS — Z83438 Family history of other disorder of lipoprotein metabolism and other lipidemia: Secondary | ICD-10-CM

## 2020-04-03 DIAGNOSIS — I10 Essential (primary) hypertension: Secondary | ICD-10-CM | POA: Diagnosis not present

## 2020-04-03 DIAGNOSIS — F1410 Cocaine abuse, uncomplicated: Secondary | ICD-10-CM | POA: Diagnosis present

## 2020-04-03 DIAGNOSIS — Z841 Family history of disorders of kidney and ureter: Secondary | ICD-10-CM

## 2020-04-03 DIAGNOSIS — Z951 Presence of aortocoronary bypass graft: Secondary | ICD-10-CM

## 2020-04-03 DIAGNOSIS — K3184 Gastroparesis: Secondary | ICD-10-CM

## 2020-04-03 DIAGNOSIS — R6510 Systemic inflammatory response syndrome (SIRS) of non-infectious origin without acute organ dysfunction: Secondary | ICD-10-CM | POA: Diagnosis not present

## 2020-04-03 DIAGNOSIS — Z886 Allergy status to analgesic agent status: Secondary | ICD-10-CM

## 2020-04-03 DIAGNOSIS — I132 Hypertensive heart and chronic kidney disease with heart failure and with stage 5 chronic kidney disease, or end stage renal disease: Secondary | ICD-10-CM | POA: Diagnosis present

## 2020-04-03 DIAGNOSIS — F1910 Other psychoactive substance abuse, uncomplicated: Secondary | ICD-10-CM | POA: Diagnosis present

## 2020-04-03 DIAGNOSIS — E8889 Other specified metabolic disorders: Secondary | ICD-10-CM | POA: Diagnosis present

## 2020-04-03 DIAGNOSIS — Z7951 Long term (current) use of inhaled steroids: Secondary | ICD-10-CM

## 2020-04-03 DIAGNOSIS — Z8673 Personal history of transient ischemic attack (TIA), and cerebral infarction without residual deficits: Secondary | ICD-10-CM

## 2020-04-03 DIAGNOSIS — M79671 Pain in right foot: Secondary | ICD-10-CM | POA: Diagnosis not present

## 2020-04-03 DIAGNOSIS — Z809 Family history of malignant neoplasm, unspecified: Secondary | ICD-10-CM

## 2020-04-03 DIAGNOSIS — D649 Anemia, unspecified: Secondary | ICD-10-CM | POA: Diagnosis not present

## 2020-04-03 DIAGNOSIS — E785 Hyperlipidemia, unspecified: Secondary | ICD-10-CM | POA: Diagnosis present

## 2020-04-03 DIAGNOSIS — R509 Fever, unspecified: Secondary | ICD-10-CM

## 2020-04-03 DIAGNOSIS — I252 Old myocardial infarction: Secondary | ICD-10-CM

## 2020-04-03 DIAGNOSIS — M19012 Primary osteoarthritis, left shoulder: Secondary | ICD-10-CM | POA: Diagnosis present

## 2020-04-03 DIAGNOSIS — Z79891 Long term (current) use of opiate analgesic: Secondary | ICD-10-CM

## 2020-04-03 DIAGNOSIS — N2581 Secondary hyperparathyroidism of renal origin: Secondary | ICD-10-CM | POA: Diagnosis present

## 2020-04-03 DIAGNOSIS — I714 Abdominal aortic aneurysm, without rupture: Secondary | ICD-10-CM | POA: Diagnosis present

## 2020-04-03 DIAGNOSIS — E1165 Type 2 diabetes mellitus with hyperglycemia: Secondary | ICD-10-CM | POA: Diagnosis present

## 2020-04-03 DIAGNOSIS — E1169 Type 2 diabetes mellitus with other specified complication: Secondary | ICD-10-CM | POA: Diagnosis present

## 2020-04-03 DIAGNOSIS — D62 Acute posthemorrhagic anemia: Secondary | ICD-10-CM | POA: Diagnosis present

## 2020-04-03 DIAGNOSIS — L97929 Non-pressure chronic ulcer of unspecified part of left lower leg with unspecified severity: Secondary | ICD-10-CM | POA: Diagnosis present

## 2020-04-03 DIAGNOSIS — E1152 Type 2 diabetes mellitus with diabetic peripheral angiopathy with gangrene: Secondary | ICD-10-CM | POA: Diagnosis present

## 2020-04-03 DIAGNOSIS — Z833 Family history of diabetes mellitus: Secondary | ICD-10-CM

## 2020-04-03 DIAGNOSIS — Z20822 Contact with and (suspected) exposure to covid-19: Secondary | ICD-10-CM | POA: Diagnosis present

## 2020-04-03 DIAGNOSIS — E1122 Type 2 diabetes mellitus with diabetic chronic kidney disease: Secondary | ICD-10-CM | POA: Diagnosis present

## 2020-04-03 DIAGNOSIS — I248 Other forms of acute ischemic heart disease: Secondary | ICD-10-CM | POA: Diagnosis present

## 2020-04-03 DIAGNOSIS — N186 End stage renal disease: Secondary | ICD-10-CM | POA: Diagnosis present

## 2020-04-03 DIAGNOSIS — E1143 Type 2 diabetes mellitus with diabetic autonomic (poly)neuropathy: Secondary | ICD-10-CM | POA: Diagnosis present

## 2020-04-03 DIAGNOSIS — K31811 Angiodysplasia of stomach and duodenum with bleeding: Secondary | ICD-10-CM | POA: Diagnosis present

## 2020-04-03 DIAGNOSIS — Z6841 Body Mass Index (BMI) 40.0 and over, adult: Secondary | ICD-10-CM

## 2020-04-03 DIAGNOSIS — F17210 Nicotine dependence, cigarettes, uncomplicated: Secondary | ICD-10-CM | POA: Diagnosis present

## 2020-04-03 DIAGNOSIS — R296 Repeated falls: Secondary | ICD-10-CM | POA: Diagnosis present

## 2020-04-03 DIAGNOSIS — I4891 Unspecified atrial fibrillation: Secondary | ICD-10-CM | POA: Diagnosis present

## 2020-04-03 DIAGNOSIS — Z79899 Other long term (current) drug therapy: Secondary | ICD-10-CM

## 2020-04-03 DIAGNOSIS — Z8249 Family history of ischemic heart disease and other diseases of the circulatory system: Secondary | ICD-10-CM

## 2020-04-03 DIAGNOSIS — E11621 Type 2 diabetes mellitus with foot ulcer: Secondary | ICD-10-CM | POA: Diagnosis present

## 2020-04-03 DIAGNOSIS — A419 Sepsis, unspecified organism: Principal | ICD-10-CM | POA: Diagnosis present

## 2020-04-03 DIAGNOSIS — E1142 Type 2 diabetes mellitus with diabetic polyneuropathy: Secondary | ICD-10-CM | POA: Diagnosis present

## 2020-04-03 DIAGNOSIS — M869 Osteomyelitis, unspecified: Secondary | ICD-10-CM | POA: Diagnosis present

## 2020-04-03 DIAGNOSIS — I2510 Atherosclerotic heart disease of native coronary artery without angina pectoris: Secondary | ICD-10-CM | POA: Diagnosis present

## 2020-04-03 DIAGNOSIS — Z7982 Long term (current) use of aspirin: Secondary | ICD-10-CM

## 2020-04-03 DIAGNOSIS — K922 Gastrointestinal hemorrhage, unspecified: Secondary | ICD-10-CM | POA: Diagnosis present

## 2020-04-03 DIAGNOSIS — M7989 Other specified soft tissue disorders: Secondary | ICD-10-CM | POA: Diagnosis not present

## 2020-04-03 DIAGNOSIS — K921 Melena: Secondary | ICD-10-CM | POA: Diagnosis present

## 2020-04-03 LAB — CBC WITH DIFFERENTIAL/PLATELET
Abs Immature Granulocytes: 0.05 10*3/uL (ref 0.00–0.07)
Basophils Absolute: 0.1 10*3/uL (ref 0.0–0.1)
Basophils Relative: 1 %
Eosinophils Absolute: 0.1 10*3/uL (ref 0.0–0.5)
Eosinophils Relative: 1 %
HCT: 22.4 % — ABNORMAL LOW (ref 36.0–46.0)
Hemoglobin: 6.8 g/dL — CL (ref 12.0–15.0)
Immature Granulocytes: 1 %
Lymphocytes Relative: 6 %
Lymphs Abs: 0.5 10*3/uL — ABNORMAL LOW (ref 0.7–4.0)
MCH: 27.8 pg (ref 26.0–34.0)
MCHC: 30.4 g/dL (ref 30.0–36.0)
MCV: 91.4 fL (ref 80.0–100.0)
Monocytes Absolute: 0.9 10*3/uL (ref 0.1–1.0)
Monocytes Relative: 10 %
Neutro Abs: 7.3 10*3/uL (ref 1.7–7.7)
Neutrophils Relative %: 81 %
Platelets: 269 10*3/uL (ref 150–400)
RBC: 2.45 MIL/uL — ABNORMAL LOW (ref 3.87–5.11)
RDW: 18.2 % — ABNORMAL HIGH (ref 11.5–15.5)
WBC: 8.9 10*3/uL (ref 4.0–10.5)
nRBC: 0.8 % — ABNORMAL HIGH (ref 0.0–0.2)

## 2020-04-03 LAB — BASIC METABOLIC PANEL
Anion gap: 15 (ref 5–15)
BUN: 36 mg/dL — ABNORMAL HIGH (ref 8–23)
CO2: 28 mmol/L (ref 22–32)
Calcium: 8.1 mg/dL — ABNORMAL LOW (ref 8.9–10.3)
Chloride: 94 mmol/L — ABNORMAL LOW (ref 98–111)
Creatinine, Ser: 4.87 mg/dL — ABNORMAL HIGH (ref 0.44–1.00)
GFR calc Af Amer: 10 mL/min — ABNORMAL LOW (ref >60)
GFR calc non Af Amer: 8 mL/min — ABNORMAL LOW (ref >60)
Glucose, Bld: 149 mg/dL — ABNORMAL HIGH (ref 70–99)
Potassium: 3.9 mmol/L (ref 3.5–5.1)
Sodium: 137 mmol/L (ref 135–145)

## 2020-04-03 LAB — PROTIME-INR
INR: 1.2 (ref 0.8–1.2)
Prothrombin Time: 15.2 seconds (ref 11.4–15.2)

## 2020-04-03 LAB — GLUCOSE, CAPILLARY: Glucose-Capillary: 155 mg/dL — ABNORMAL HIGH (ref 70–99)

## 2020-04-03 LAB — PREPARE RBC (CROSSMATCH)

## 2020-04-03 MED ORDER — OXYCODONE-ACETAMINOPHEN 5-325 MG PO TABS
1.0000 | ORAL_TABLET | Freq: Once | ORAL | Status: AC
  Administered 2020-04-03: 19:00:00 1 via ORAL
  Filled 2020-04-03: qty 1

## 2020-04-03 MED ORDER — ALBUTEROL SULFATE (2.5 MG/3ML) 0.083% IN NEBU: 2.5000 mg | INHALATION_SOLUTION | Freq: Four times a day (QID) | RESPIRATORY_TRACT | Status: DC | PRN

## 2020-04-03 MED ORDER — BUPROPION HCL ER (SR) 150 MG PO TB12
150.0000 mg | ORAL_TABLET | Freq: Two times a day (BID) | ORAL | Status: DC
  Administered 2020-04-03 – 2020-04-19 (×26): 150 mg via ORAL
  Filled 2020-04-03 (×27): qty 1

## 2020-04-03 MED ORDER — NITROGLYCERIN 0.4 MG SL SUBL: 0.4000 mg | SUBLINGUAL_TABLET | SUBLINGUAL | Status: DC | PRN

## 2020-04-03 MED ORDER — PANTOPRAZOLE SODIUM 40 MG PO TBEC
40.0000 mg | DELAYED_RELEASE_TABLET | Freq: Every day | ORAL | Status: DC
  Administered 2020-04-04 – 2020-04-18 (×12): 40 mg via ORAL
  Filled 2020-04-03 (×12): qty 1

## 2020-04-03 MED ORDER — MOMETASONE FURO-FORMOTEROL FUM 100-5 MCG/ACT IN AERO
2.0000 | INHALATION_SPRAY | Freq: Two times a day (BID) | RESPIRATORY_TRACT | Status: DC | PRN
  Filled 2020-04-03: qty 8.8

## 2020-04-03 MED ORDER — POLYETHYLENE GLYCOL 3350 17 G PO PACK: 17.0000 g | PACK | Freq: Every day | ORAL | Status: DC | PRN

## 2020-04-03 MED ORDER — ALLOPURINOL 100 MG PO TABS
100.0000 mg | ORAL_TABLET | Freq: Every day | ORAL | Status: DC
  Administered 2020-04-04 – 2020-04-18 (×12): 100 mg via ORAL
  Filled 2020-04-03 (×12): qty 1

## 2020-04-03 MED ORDER — INSULIN ASPART 100 UNIT/ML ~~LOC~~ SOLN
0.0000 [IU] | Freq: Three times a day (TID) | SUBCUTANEOUS | Status: DC
  Administered 2020-04-04: 13:00:00 3 [IU] via SUBCUTANEOUS
  Administered 2020-04-05: 7 [IU] via SUBCUTANEOUS
  Administered 2020-04-05: 10:00:00 3 [IU] via SUBCUTANEOUS
  Filled 2020-04-03: qty 0.2

## 2020-04-03 MED ORDER — MOMETASONE FURO-FORMOTEROL FUM 100-5 MCG/ACT IN AERO
2.0000 | INHALATION_SPRAY | RESPIRATORY_TRACT | Status: DC | PRN
  Filled 2020-04-03: qty 8.8

## 2020-04-03 MED ORDER — LORATADINE 10 MG PO TABS
10.0000 mg | ORAL_TABLET | Freq: Every day | ORAL | Status: DC
  Administered 2020-04-04 – 2020-04-18 (×12): 10 mg via ORAL
  Filled 2020-04-03 (×12): qty 1

## 2020-04-03 MED ORDER — LATANOPROST 0.005 % OP SOLN
1.0000 [drp] | Freq: Every day | OPHTHALMIC | Status: DC
  Administered 2020-04-03 – 2020-04-18 (×15): 1 [drp] via OPHTHALMIC
  Filled 2020-04-03 (×2): qty 2.5

## 2020-04-03 MED ORDER — INSULIN ASPART 100 UNIT/ML ~~LOC~~ SOLN
2.0000 [IU] | Freq: Three times a day (TID) | SUBCUTANEOUS | Status: DC
  Administered 2020-04-04 – 2020-04-19 (×33): 2 [IU] via SUBCUTANEOUS
  Filled 2020-04-03: qty 0.02

## 2020-04-03 MED ORDER — LINACLOTIDE 72 MCG PO CAPS
72.0000 ug | ORAL_CAPSULE | Freq: Every day | ORAL | Status: DC
  Administered 2020-04-04 – 2020-04-17 (×9): 72 ug via ORAL
  Filled 2020-04-03 (×18): qty 1

## 2020-04-03 MED ORDER — GABAPENTIN 300 MG PO CAPS
300.0000 mg | ORAL_CAPSULE | Freq: Every day | ORAL | Status: DC
  Administered 2020-04-03 – 2020-04-18 (×16): 300 mg via ORAL
  Filled 2020-04-03 (×16): qty 1

## 2020-04-03 MED ORDER — SUCROFERRIC OXYHYDROXIDE 500 MG PO CHEW
1000.0000 mg | CHEWABLE_TABLET | Freq: Three times a day (TID) | ORAL | Status: DC
  Administered 2020-04-04 – 2020-04-05 (×4): 1000 mg via ORAL
  Filled 2020-04-03 (×7): qty 2

## 2020-04-03 MED ORDER — ATORVASTATIN CALCIUM 40 MG PO TABS
40.0000 mg | ORAL_TABLET | Freq: Every day | ORAL | Status: DC
  Administered 2020-04-03 – 2020-04-19 (×17): 40 mg via ORAL
  Filled 2020-04-03 (×18): qty 1

## 2020-04-03 MED ORDER — SODIUM CHLORIDE 0.9% IV SOLUTION: Freq: Once | INTRAVENOUS | Status: AC

## 2020-04-03 MED ORDER — OXYCODONE HCL 5 MG PO TABS
5.0000 mg | ORAL_TABLET | Freq: Four times a day (QID) | ORAL | Status: DC | PRN
  Administered 2020-04-03 – 2020-04-19 (×23): 5 mg via ORAL
  Filled 2020-04-03 (×22): qty 1

## 2020-04-03 MED ORDER — METOPROLOL TARTRATE 25 MG PO TABS
25.0000 mg | ORAL_TABLET | Freq: Two times a day (BID) | ORAL | Status: DC
  Administered 2020-04-04 – 2020-04-18 (×21): 25 mg via ORAL
  Filled 2020-04-03 (×26): qty 1

## 2020-04-03 MED ORDER — SODIUM CHLORIDE 0.9 % IV SOLN: INTRAVENOUS | Status: AC

## 2020-04-03 MED ORDER — FERROUS SULFATE 325 (65 FE) MG PO TABS
325.0000 mg | ORAL_TABLET | Freq: Two times a day (BID) | ORAL | Status: DC
  Administered 2020-04-04 – 2020-04-05 (×3): 325 mg via ORAL
  Filled 2020-04-03 (×3): qty 1

## 2020-04-03 NOTE — ED Triage Notes (Signed)
Per EMS, Pt is coming from [facility]. Pt was going to receive her dialysis today, when checking her lab work pts hgb was 6.9. Pt denies any pain, N/V/D or complaints.

## 2020-04-03 NOTE — ED Provider Notes (Addendum)
[facility] DEPT Provider Note   CSN: [id number] Arrival date & time: 04/03/20  1533     History Chief Complaint  Patient presents with  . Abnormal Lab    [name] [name] is a 69 y.o. female.  She is brought in from her facility at [facility] for evaluation of a low hemoglobin of 6.9.  She denies any complaints but is very tired.  She falls asleep during the interview.  She has not seen any bleeding.  She was admitted a month ago for anemia and had a colonoscopy done that showed a bleeding colonic ectasia that was clipped.  She was back in the emergency department a few weeks ago and received a unit of blood.  She denies any fevers or chills cough chest pain abdominal pain vomiting or diarrhea.  The history is provided by the EMS personnel and the patient.  Abnormal Lab Time since result:  Today Patient referred by:  Nursing home Result type: hematology   Hematology:    Hemoglobin:  Low      Past Medical History:  Diagnosis Date  . Abscess   . Acute blood loss anemia 08/17/2019  . Acute pulmonary edema (HCC)   . Acute respiratory failure ([place]) 10/18/2014  . Acute respiratory failure with hypoxia and hypercapnia ([place]) 06/01/2019  . AMS (altered mental status) 01/01/2020  . Anemia 08/2016  . Angiodysplasia of colon   . Arthritis of left shoulder region 03/23/2013  . Bleeding gastrointestinal   . Cardiomegaly 05/2019  . Chest pain 04/17/2016  . Chronic combined systolic and diastolic CHF (congestive heart failure) (HCC)    a. EF 40-45%, mild LVH, mid apicalanteroseptal and apical HK.  . CKD (chronic kidney disease), stage III   . Cocaine abuse ([place])    crack cocaine heavily until 2008 then sporadic use since then  . Coronary artery disease    a. 06/2012 NSTEMI/CABG x 3 (LIMA->LAD, VG->OM2, VG->LCX);  b. 04/2015 MV: EF<30%, mid ant, apicalanterior, apical infarct;  c. 04/2015 Cath: LM nl, LAD 90p, LCX 12m, OM1 min irregs, RCA mild dzs, LIMA->LAD nl w/  dist LAD dzs, VG->OM2 nl, VG->LCX nl-->Med Rx.  . CVA (cerebral infarction)    a. right internal capsule stroke in 12/2006  . Demand ischemia ([place])   . Diabetes mellitus    diagnosed in 2008  . Elevated troponin 04/27/2019  . Essential hypertension   . Glaucoma   . Gout   . Heme positive stool   . HFrEF (heart failure with reduced ejection fraction) ([place])   . Hyperlipidemia   . Hyperparathyroidism, secondary renal ([place])   . Hypertensive crisis 06/02/2019  . Left-sided sensory deficit present   . Lobar pneumonia ([place]) 04/27/2019  . Obesity, morbid ([place])   . Pneumonia   . Pulmonary edema 05/2019  . PVD (peripheral vascular disease) ([place])    a. 06/2012 ABI's: R - 0.73, L - 0.71.  Marland Kitchen Renal mass, right   . Sepsis ([place]) 04/27/2019  . Shortness of breath dyspnea   . Stroke ([place])   . Thrombocytosis ([place]) 04/17/2016  . Thyroid nodule    FNA in 6384 showed follicular cells but not definate neoplasm  . Tobacco abuse   . Trichomoniasis     Patient Active Problem List   Diagnosis Date Noted  . AVM (arteriovenous malformation) of duodenum, acquired   . GAVE (gastric antral vascular ectasia)   . Polyp of hepatic flexure of colon   . Acute post-hemorrhagic anemia   .  Dialysis patient ([place])   . Gastroesophageal reflux disease without esophagitis   . AVM (arteriovenous malformation) of colon   . Symptomatic anemia 02/21/2020  . AMS (altered mental status) 01/01/2020  . Recurrent falls 10/09/2019  . Generalized weakness 10/09/2019  . Unspecified atrial fibrillation ([place]) 10/08/2019  . Acute GI bleeding 10/05/2019  . Hyperkalemia   . Depression   . Esophageal stricture   . Diabetic gastroparesis ([place])   . Upper GI bleed 08/16/2019  . Elevated troponin 04/27/2019  . Hiatal hernia   . Iron deficiency anemia   . Microcytic anemia 02/05/2016  . Acute on chronic renal failure ([place])   . CAD in native artery   . Substance abuse ([place])   . Chronic combined systolic and diastolic CHF (congestive  heart failure) ([place])   . ESRD (end stage renal disease) ([place])   . Obesity, Class III, BMI 40-49.9 (morbid obesity) ([place])   . Type 2 diabetes mellitus with chronic kidney disease on chronic dialysis, with long-term current use of insulin ([place]) 10/18/2014  . S/P CABG (coronary artery bypass graft) 09/21/2013  . Arthritis of right shoulder region 03/23/2013  . Tobacco abuse   . Coronary artery disease   . NSTEMI (non-ST elevated myocardial infarction) ([place]) 06/29/2012  . History of cocaine abuse ([place]) 06/29/2012  . Essential hypertension 06/29/2012  . Glaucoma   . Hyperlipidemia   . History of CVA (cerebrovascular accident)     Past Surgical History:  Procedure Laterality Date  . ABDOMINAL AORTOGRAM W/LOWER EXTREMITY Bilateral 02/06/2020   Procedure: ABDOMINAL AORTOGRAM W/LOWER EXTREMITY;  Surgeon: Waynetta Sandy, MD;  Location: [facility];  Service: Cardiovascular;  Laterality: Bilateral;  . AMPUTATION Left 02/07/2020   Procedure: AMPUTATION DIGIT - LEFT GREAT TOE;  Surgeon: Waynetta Sandy, MD;  Location: [facility];  Service: Vascular;  Laterality: Left;  . AV FISTULA PLACEMENT Left 08/25/2019   Procedure: ARTERIOVENOUS (AV) BRACHIOCEPHALIC FISTULA CREATION;  Surgeon: Waynetta Sandy, MD;  Location: [facility];  Service: Vascular;  Laterality: Left;  . CARDIAC CATHETERIZATION    . CARDIAC CATHETERIZATION N/A 05/17/2015   Procedure: Left Heart Cath and Cors/Grafts Angiography;  Surgeon: [name], MD;  Location: [facility];  Service: Cardiovascular;  Laterality: N/A;  . COLONOSCOPY Left 02/25/2020   Procedure: COLONOSCOPY;  Surgeon: Lavena Bullion, DO;  Location: [facility];  Service: Gastroenterology;  Laterality: Left;  . COLONOSCOPY WITH PROPOFOL N/A 04/21/2016   Procedure: COLONOSCOPY WITH PROPOFOL;  Surgeon: Irene Shipper, MD;  Location: [facility];  Service: Endoscopy;  Laterality: N/A;  . CORONARY ARTERY BYPASS GRAFT  07/09/2012    Procedure: CORONARY ARTERY BYPASS GRAFTING (CABG);  Surgeon: Ivin Poot, MD;  Location: [facility];  Service: Open Heart Surgery;  Laterality: N/A;  . ENTEROSCOPY N/A 10/07/2019   Procedure: ENTEROSCOPY;  Surgeon: Jackquline Denmark, MD;  Location: [facility] ENDOSCOPY;  Service: Endoscopy;  Laterality: N/A;  . ENTEROSCOPY Left 02/25/2020   Procedure: ENTEROSCOPY;  Surgeon: Lavena Bullion, DO;  Location: [facility];  Service: Gastroenterology;  Laterality: Left;  . ESOPHAGOGASTRODUODENOSCOPY N/A 04/20/2016   Procedure: ESOPHAGOGASTRODUODENOSCOPY (EGD);  Surgeon: Gatha Mayer, MD;  Location: [facility] ENDOSCOPY;  Service: Endoscopy;  Laterality: N/A;  . ESOPHAGOGASTRODUODENOSCOPY (EGD) WITH PROPOFOL N/A 08/17/2019   Procedure: ESOPHAGOGASTRODUODENOSCOPY (EGD) WITH PROPOFOL;  Surgeon: Irene Shipper, MD;  Location: [facility] ENDOSCOPY;  Service: Endoscopy;  Laterality: N/A;  . HEMOSTASIS CLIP PLACEMENT  02/25/2020   Procedure: HEMOSTASIS CLIP PLACEMENT;  Surgeon: Lavena Bullion, DO;  Location: [facility] ENDOSCOPY;  Service: Gastroenterology;;  . HOT HEMOSTASIS N/A 02/25/2020   Procedure: HOT HEMOSTASIS (ARGON PLASMA COAGULATION/BICAP);  Surgeon: Lavena Bullion, DO;  Location: [facility] ENDOSCOPY;  Service: Gastroenterology;  Laterality: N/A;  . INSERTION OF DIALYSIS CATHETER Right 08/25/2019   Procedure: INSERTION OF DIALYSIS CATHETER;  Surgeon: Waynetta Sandy, MD;  Location: [facility];  Service: Vascular;  Laterality: Right;  . IR FLUORO GUIDE CV LINE RIGHT  08/19/2019  . IR US GUIDE VASC ACCESS RIGHT  08/19/2019  . LEFT HEART CATHETERIZATION WITH CORONARY ANGIOGRAM N/A 06/29/2012   Procedure: LEFT HEART CATHETERIZATION WITH CORONARY ANGIOGRAM;  Surgeon: Peter M Martinique, MD;  Location: [facility] CATH LAB;  Service: Cardiovascular;  Laterality: N/A;  . PERIPHERAL VASCULAR ATHERECTOMY Left 02/06/2020   Procedure: PERIPHERAL VASCULAR ATHERECTOMY;  Surgeon: Waynetta Sandy, MD;  Location: [facility];  Service:  Cardiovascular;  Laterality: Left;  SFA  . PERIPHERAL VASCULAR INTERVENTION Left 02/06/2020   Procedure: PERIPHERAL VASCULAR INTERVENTION;  Surgeon: Waynetta Sandy, MD;  Location: [facility];  Service: Cardiovascular;  Laterality: Left;  SFA  . REVISON OF ARTERIOVENOUS FISTULA Left 02/07/2020   Procedure: REVISON OF ARTERIOVENOUS FISTULA;  Surgeon: Waynetta Sandy, MD;  Location: [facility];  Service: Vascular;  Laterality: Left;  . STERNAL WOUND DEBRIDEMENT  08/17/2012   Procedure: STERNAL WOUND DEBRIDEMENT;  Surgeon: Ivin Poot, MD;  Location: [facility];  Service: Thoracic;  Laterality: N/A;  wound vac application  . STERNAL WOUND DEBRIDEMENT  08/24/2012   Procedure: STERNAL WOUND DEBRIDEMENT;  Surgeon: Ivin Poot, MD;  Location: [facility];  Service: Thoracic;  Laterality: N/A;  . STERNAL WOUND DEBRIDEMENT  09/01/2012   Procedure: STERNAL WOUND DEBRIDEMENT;  Surgeon: Ivin Poot, MD;  Location: [facility];  Service: Thoracic;  Laterality: N/A;  . STERNAL WOUND DEBRIDEMENT  09/20/2012   Procedure: STERNAL WOUND DEBRIDEMENT;  Surgeon: Ivin Poot, MD;  Location: [facility] OR;  Service: Thoracic;  Laterality: N/A;  wound vac change  . SUBMUCOSAL TATTOO INJECTION  10/07/2019   Procedure: SUBMUCOSAL TATTOO INJECTION;  Surgeon: Jackquline Denmark, MD;  Location: [facility] ENDOSCOPY;  Service: Endoscopy;;     OB History   No obstetric history on file.     Family History  Problem Relation Age of Onset  . Diabetes Mother   . Hypertension Mother   . Cancer Mother   . Hyperlipidemia Father   . Hypertension Father   . Kidney disease Father   . Gout Father   . Cerebrovascular Accident Father   . Other Other        no known family CAD    Social History   Tobacco Use  . Smoking status: Current Some Day Smoker    Packs/day: 0.25    Years: 50.00    Pack years: 12.50    Types: Cigarettes  . Smokeless tobacco: Never Used  Substance Use Topics  . Alcohol use: No    Alcohol/week: 0.0  standard drinks  . Drug use: Yes    Types: Cocaine    Comment: pt denies at current time    Home Medications Prior to Admission medications   Medication Sig Start Date End Date Taking? Authorizing Provider  allopurinol (ZYLOPRIM) 100 MG tablet Take 1 tablet (100 mg total) by mouth daily. 02/28/20   Gladys Damme, MD  aspirin 81 MG EC tablet Take 1 tablet (81 mg total) by mouth daily. 02/28/20   Gladys Damme, MD  atorvastatin (LIPITOR) 40 MG tablet  Take 1 tablet (40 mg total) by mouth daily at 6 PM. 02/28/20   Gladys Damme, MD  buPROPion [facility] SR) 150 MG 12 hr tablet Take 1 tablet (150 mg total) by mouth 2 (two) times daily. 01/16/16   Mikhail, Velta Addison, DO  cetirizine (ZYRTEC) 10 MG tablet Take 10 mg by mouth daily as needed for allergies.     [provider]  ferrous sulfate 325 (65 FE) MG tablet Take 325 mg by mouth 2 (two) times daily with a meal.     [provider]  gabapentin (NEURONTIN) 300 MG capsule Take 1 capsule (300 mg total) by mouth at bedtime. 02/10/20   Aline August, MD  insulin aspart (NOVOLOG) 100 UNIT/ML injection Inject 2 Units into the skin 3 (three) times daily with meals. Patient not taking: Reported on 03/16/2020 02/10/20   Aline August, MD  insulin lispro (HUMALOG) 100 UNIT/ML injection Inject 2 Units into the skin 3 (three) times daily before meals.    [provider]  latanoprost (XALATAN) 0.005 % ophthalmic solution Place 1 drop into both eyes at bedtime.    [provider]  LINZESS 72 MCG capsule Take 72 mcg by mouth daily before breakfast.  11/21/17   [provider]  metoprolol tartrate (LOPRESSOR) 25 MG tablet Take 1 tablet (25 mg total) by mouth 2 (two) times daily. 02/10/20   Aline August, MD  mometasone-formoterol (DULERA) 100-5 MCG/ACT AERO Inhale 2 puffs into the lungs every 4 (four) hours as needed for wheezing.    [provider]  multivitamin (RENA-VIT) TABS tablet Take 1 tablet by mouth at  bedtime. 02/10/20   Aline August, MD  nitroGLYCERIN (NITROSTAT) 0.4 MG SL tablet Place 1 tablet (0.4 mg total) under the tongue every 5 (five) minutes as needed for chest pain. 04/30/19   [name], MD  oxyCODONE (OXY IR/ROXICODONE) 5 MG immediate release tablet Take 1 tablet (5 mg total) by mouth every 6 (six) hours as needed for moderate pain. 02/10/20   Rhyne, Hulen Shouts, PA-C  pantoprazole (PROTONIX) 40 MG tablet Take 1 tablet (40 mg total) by mouth daily. 02/28/20 03/29/20  Gladys Damme, MD  polyethylene glycol (MIRALAX) 17 g packet Take 17 g by mouth daily as needed for moderate constipation. 02/10/20   Aline August, MD  TUMS 500 MG chewable tablet Chew 2 tablets by mouth at bedtime. 01/19/20   [provider]  VELPHORO 500 MG chewable tablet Chew 2 tablets (1,000 mg total) by mouth 3 (three) times daily with meals. 02/10/20   Aline August, MD    Allergies    Naproxen  Review of Systems   Review of Systems  Constitutional: Negative for fever.  HENT: Negative for sore throat.   Eyes: Negative for visual disturbance.  Respiratory: Negative for shortness of breath.   Cardiovascular: Negative for chest pain.  Gastrointestinal: Negative for abdominal pain.  Genitourinary: Negative for dysuria.  Musculoskeletal: Negative for neck pain.  Skin: Positive for wound (healing left foot wound). Negative for rash.  Neurological: Negative for headaches.    Physical Exam Updated Vital Signs BP (!) 114/53   Pulse 68   Temp 98.8 F (37.1 C)   Resp 18   Ht 5\' 8"  (1.727 m)   Wt 118.4 kg   SpO2 97%   BMI 39.68 kg/m   Physical Exam Vitals and nursing note reviewed.  Constitutional:      General: She is sleeping. She is not in acute distress.    Appearance: She is  well-developed. She is obese.  HENT:     Head: Normocephalic and atraumatic.  Eyes:     Conjunctiva/sclera: Conjunctivae normal.  Cardiovascular:     Rate and Rhythm: Normal rate and regular rhythm.     Heart sounds:  No murmur.  Pulmonary:     Effort: Pulmonary effort is normal. No respiratory distress.     Breath sounds: Normal breath sounds.  Abdominal:     Palpations: Abdomen is soft.     Tenderness: There is no abdominal tenderness. There is no guarding or rebound.  Musculoskeletal:     Cervical back: Neck supple.     Comments: She had a dressing of her left foot with some seeping drainage.  Skin:    General: Skin is warm and dry.     Capillary Refill: Capillary refill takes less than 2 seconds.  Neurological:     General: No focal deficit present.     ED Results / Procedures / Treatments   Labs (all labs ordered are listed, but only abnormal results are displayed) Labs Reviewed  BASIC METABOLIC PANEL - Abnormal; Notable for the following components:      Result Value   Chloride 94 (*)    Glucose, Bld 149 (*)    BUN 36 (*)    Creatinine, Ser 4.87 (*)    Calcium 8.1 (*)    GFR calc non Af Amer 8 (*)    GFR calc Af Amer 10 (*)    All other components within normal limits  CBC WITH DIFFERENTIAL/PLATELET - Abnormal; Notable for the following components:   RBC 2.45 (*)    Hemoglobin 6.8 (*)    HCT 22.4 (*)    RDW 18.2 (*)    nRBC 0.8 (*)    Lymphs Abs 0.5 (*)    All other components within normal limits  HEMOGLOBIN AND HEMATOCRIT, BLOOD - Abnormal; Notable for the following components:   Hemoglobin 7.6 (*)    HCT 25.0 (*)    All other components within normal limits  GLUCOSE, CAPILLARY - Abnormal; Notable for the following components:   Glucose-Capillary 155 (*)    All other components within normal limits  SARS CORONAVIRUS 2 (TAT 6-24 HRS)  PROTIME-INR  GLUCOSE, CAPILLARY  HEMOGLOBIN A1C  TYPE AND SCREEN  PREPARE RBC (CROSSMATCH)    EKG EKG Interpretation  Date/Time:  Tuesday April 03 2020 16:32:37 EDT Ventricular Rate:  63 PR Interval:    QRS Duration: 107 QT Interval:  472 QTC Calculation: 484 R Axis:   33 Text Interpretation: Atrial fibrillation LVH with  secondary repolarization abnormality Baseline wander in lead(s) V3 similar to prior today Confirmed by Aletta Edouard [phone]) on 04/03/2020 4:41:01 PM   Radiology No results found.  Procedures .Critical Care Performed by: Hayden Rasmussen, MD Authorized by: Hayden Rasmussen, MD   Critical care provider statement:    Critical care time (minutes):  45   Critical care time was exclusive of:  Separately billable procedures and treating other patients   Critical care was necessary to treat or prevent imminent or life-threatening deterioration of the following conditions:  Circulatory failure   Critical care was time spent personally by me on the following activities:  Discussions with consultants, evaluation of patient's response to treatment, examination of patient, ordering and performing treatments and interventions, ordering and review of laboratory studies, ordering and review of radiographic studies, pulse oximetry, re-evaluation of patient's condition, obtaining history from patient or surrogate, review of old charts and development of  treatment plan with patient or surrogate   I assumed direction of critical care for this patient from another provider in my specialty: no     (including critical care time)  Medications Ordered in ED Medications  allopurinol (ZYLOPRIM) tablet 100 mg (100 mg Oral Given 04/04/20 0923)  oxyCODONE (Oxy IR/ROXICODONE) immediate release tablet 5 mg (5 mg Oral Given 04/04/20 0927)  atorvastatin (LIPITOR) tablet 40 mg (40 mg Oral Given 04/03/20 2147)  metoprolol tartrate (LOPRESSOR) tablet 25 mg (25 mg Oral Given 04/04/20 0916)  nitroGLYCERIN (NITROSTAT) SL tablet 0.4 mg (has no administration in time range)  buPROPion (WELLBUTRIN SR) 12 hr tablet 150 mg (150 mg Oral Given 04/04/20 0916)  insulin aspart (novoLOG) injection 2 Units (2 Units Subcutaneous Given 04/04/20 0923)  linaclotide (LINZESS) capsule 72 mcg (72 mcg Oral Given 04/04/20 0923)  pantoprazole (PROTONIX) EC  tablet 40 mg (40 mg Oral Given 04/04/20 0916)  polyethylene glycol (MIRALAX / GLYCOLAX) packet 17 g (has no administration in time range)  sucroferric oxyhydroxide (VELPHORO) chewable tablet 1,000 mg (1,000 mg Oral Given 04/04/20 0923)  ferrous sulfate tablet 325 mg (325 mg Oral Given 04/04/20 0916)  gabapentin (NEURONTIN) capsule 300 mg (300 mg Oral Given 04/03/20 2146)  albuterol (PROVENTIL) (2.5 MG/3ML) 0.083% nebulizer solution 2.5 mg (has no administration in time range)  loratadine (CLARITIN) tablet 10 mg (10 mg Oral Given 04/04/20 0916)  latanoprost (XALATAN) 0.005 % ophthalmic solution 1 drop (1 drop Both Eyes Given 04/03/20 2146)  0.9 %  sodium chloride infusion ( Intravenous New Bag/Given (Non-Interop) 04/04/20 0140)  insulin aspart (novoLOG) injection 0-20 Units (0 Units Subcutaneous Not Given 04/04/20 0740)  mometasone-formoterol (DULERA) 100-5 MCG/ACT inhaler 2 puff (has no administration in time range)  Chlorhexidine Gluconate Cloth 2 % PADS 6 each (6 each Topical Given 04/04/20 0923)  oxyCODONE-acetaminophen (PERCOCET/ROXICET) 5-325 MG per tablet 1 tablet (1 tablet Oral Given 04/03/20 1835)  0.9 %  sodium chloride infusion (Manually program via Guardrails IV Fluids) ( Intravenous Stopped 04/04/20 0140)    ED Course  I have reviewed the triage vital signs and the nursing notes.  Pertinent labs & imaging results that were available during my care of the patient were reviewed by me and considered in my medical decision making (see chart for details).  Clinical Course as of Apr 03 1640  Tue Apr 03, 2020  1641 EKG shows the patient to apparently be in A. fib.  I do not see this on her problem list but I reviewed prior records and apparently she is at least been in it before.  There was a discussion to not put her on anticoagulation due to her GI bleeding.   [MB]    Clinical Course User Index [MB] Hayden Rasmussen, MD   MDM Rules/Calculators/A&P                     This patient complains of low  hemoglobin; this involves an extensive number of treatment Options and is a complaint that carries with it a high risk of complications and Morbidity. The differential includes active bleeding, anemia of chronic disease, recent history of GI bleed, metabolic derangement, cardiac ischemia in the setting of anemia  I ordered, reviewed and interpreted labs, which included hemoglobin critically low at 6.8.  Chemistry is abnormal for an elevated creatinine of 4.8 consistent with her end-stage renal disease. I ordered medication transfusion Previous records obtained and reviewed including last admission which states that her transfusion level should be for  hemoglobin less than 8 due to her cardiac disease.  During her last admission she required colonoscopy and clipping of bleeding colonic ectasia. I consulted [facility] hospitalist Dr. Linda Hedges and discussed lab and imaging findings  Discussed findings with patient and need for transfusion help stabilize her cardiovascular disease.  She is in agreement for admission.  CHA2DS2/VAS Stroke Risk Points  Current as of 2 minutes ago     6 >= 2 Points: High Risk  1 - 1.99 Points: Medium Risk  0 Points: Low Risk    The patient's score has not changed in the past year.: No Change     Details    This score determines the patient's risk of having a stroke if the  patient has atrial fibrillation.       Points Metrics  1 Has Congestive Heart Failure:  Yes    Current as of 2 minutes ago  1 Has Vascular Disease:  Yes    Current as of 2 minutes ago  1 Has Hypertension:  Yes    Current as of 2 minutes ago  1 Age:  82    Current as of 2 minutes ago  1 Has Diabetes:  Yes    Current as of 2 minutes ago  0 Had Stroke:  No  Had TIA:  No  Had thromboembolism:  No    Current as of 2 minutes ago  1 Female:  Yes    Current as of 2 minutes ago           Final Clinical Impression(s) / ED Diagnoses Final diagnoses:  Symptomatic anemia  PAF (paroxysmal atrial  fibrillation) ([place])  ESRD on dialysis [facility])    Rx / DC Orders ED Discharge Orders    None       Hayden Rasmussen, MD 04/04/20 1014    Hayden Rasmussen, MD 04/16/20 1814

## 2020-04-03 NOTE — H&P (Signed)
History and Physical    [name] [name] SNK:[id number] DOB: August 08, 1951 DOA: 04/03/2020  PCP: [facility] [place]  Patient coming from: HD center  I have personally briefly reviewed patient's old medical records in [place]  Chief Complaint: weak, tired, anemiac to 6.8 at dialysis center  HPI: [name] [name] is a 69 y.o. female followed by Familypractice servicewith medical history significant of CAD s/p CABG, combined CHF, substance abuse, insulin dependent DM, h/oCVA. Patient has had recent prior admission in Feb for anemia. She was found by GI tohave esophageal erosions and colonic AVMs. Prior recommendation was to keep Hgb 8g or better 2/2 CAD. After her regular dialysis session today she was found to have Hgb 6.8 resulting in referral to [facility] for evaluation and transfusion. She endorses feeling more fatigued and sluggish than usual. Denies c/p, any active bleeding.    ED Course: Hemodynamically stable. Lab confirms Hgb 6.8 g. Labs otherwise unremarkable. EKG with A. Fib. No evidence of decompensated CHF. She is referred to [facility] for observation admission for transfusion.   Review of Systems: As per HPI otherwise 10 point review of systems negative.    Past Medical History:  Diagnosis Date  . Abscess   . Acute blood loss anemia 08/17/2019  . Acute pulmonary edema (HCC)   . Acute respiratory failure ([place]) 10/18/2014  . Acute respiratory failure with hypoxia and hypercapnia ([place]) 06/01/2019  . AMS (altered mental status) 01/01/2020  . Anemia 08/2016  . Angiodysplasia of colon   . Arthritis of left shoulder region 03/23/2013  . Bleeding gastrointestinal   . Cardiomegaly 05/2019  . Chest pain 04/17/2016  . Chronic combined systolic and diastolic CHF (congestive heart failure) (HCC)    a. EF 40-45%, mild LVH, mid apicalanteroseptal and apical HK.  . CKD (chronic kidney disease), stage III   . Cocaine abuse ([place])    crack cocaine heavily until 2008 then sporadic use  since then  . Coronary artery disease    a. 06/2012 NSTEMI/CABG x 3 (LIMA->LAD, VG->OM2, VG->LCX);  b. 04/2015 MV: EF<30%, mid ant, apicalanterior, apical infarct;  c. 04/2015 Cath: LM nl, LAD 90p, LCX 74m, OM1 min irregs, RCA mild dzs, LIMA->LAD nl w/ dist LAD dzs, VG->OM2 nl, VG->LCX nl-->Med Rx.  . CVA (cerebral infarction)    a. right internal capsule stroke in 12/2006  . Demand ischemia ([place])   . Diabetes mellitus    diagnosed in 2008  . Elevated troponin 04/27/2019  . Essential hypertension   . Glaucoma   . Gout   . Heme positive stool   . HFrEF (heart failure with reduced ejection fraction) ([place])   . Hyperlipidemia   . Hyperparathyroidism, secondary renal ([place])   . Hypertensive crisis 06/02/2019  . Left-sided sensory deficit present   . Lobar pneumonia ([place]) 04/27/2019  . Obesity, morbid ([place])   . Pneumonia   . Pulmonary edema 05/2019  . PVD (peripheral vascular disease) ([place])    a. 06/2012 ABI's: R - 0.73, L - 0.71.  [place] Kitchen Renal mass, right   . Sepsis ([place]) 04/27/2019  . Shortness of breath dyspnea   . Stroke ([place])   . Thrombocytosis ([place]) 04/17/2016  . Thyroid nodule    FNA in 9379 showed follicular cells but not definate neoplasm  . Tobacco abuse   . Trichomoniasis     Past Surgical History:  Procedure Laterality Date  . ABDOMINAL AORTOGRAM W/LOWER EXTREMITY Bilateral 02/06/2020   Procedure: ABDOMINAL AORTOGRAM W/LOWER EXTREMITY;  Surgeon: Donzetta Matters,  Georgia Dom, MD;  Location: [place] CV LAB;  Service: Cardiovascular;  Laterality: Bilateral;  . AMPUTATION Left 02/07/2020   Procedure: AMPUTATION DIGIT - LEFT GREAT TOE;  Surgeon: Waynetta Sandy, MD;  Location: [place];  Service: Vascular;  Laterality: Left;  . AV FISTULA PLACEMENT Left 08/25/2019   Procedure: ARTERIOVENOUS (AV) BRACHIOCEPHALIC FISTULA CREATION;  Surgeon: Waynetta Sandy, MD;  Location: [place];  Service: Vascular;  Laterality: Left;  . CARDIAC CATHETERIZATION    . CARDIAC CATHETERIZATION N/A  05/17/2015   Procedure: Left Heart Cath and Cors/Grafts Angiography;  Surgeon: [name], MD;  Location: [place] CV LAB;  Service: Cardiovascular;  Laterality: N/A;  . COLONOSCOPY Left 02/25/2020   Procedure: COLONOSCOPY;  Surgeon: Lavena Bullion, DO;  Location: [place];  Service: Gastroenterology;  Laterality: Left;  . COLONOSCOPY WITH PROPOFOL N/A 04/21/2016   Procedure: COLONOSCOPY WITH PROPOFOL;  Surgeon: Irene Shipper, MD;  Location: [place];  Service: Endoscopy;  Laterality: N/A;  . CORONARY ARTERY BYPASS GRAFT  07/09/2012   Procedure: CORONARY ARTERY BYPASS GRAFTING (CABG);  Surgeon: Ivin Poot, MD;  Location: [place];  Service: Open Heart Surgery;  Laterality: N/A;  . ENTEROSCOPY N/A 10/07/2019   Procedure: ENTEROSCOPY;  Surgeon: Jackquline Denmark, MD;  Location: [facility] ENDOSCOPY;  Service: Endoscopy;  Laterality: N/A;  . ENTEROSCOPY Left 02/25/2020   Procedure: ENTEROSCOPY;  Surgeon: Lavena Bullion, DO;  Location: [place];  Service: Gastroenterology;  Laterality: Left;  . ESOPHAGOGASTRODUODENOSCOPY N/A 04/20/2016   Procedure: ESOPHAGOGASTRODUODENOSCOPY (EGD);  Surgeon: Gatha Mayer, MD;  Location: [facility] ENDOSCOPY;  Service: Endoscopy;  Laterality: N/A;  . ESOPHAGOGASTRODUODENOSCOPY (EGD) WITH PROPOFOL N/A 08/17/2019   Procedure: ESOPHAGOGASTRODUODENOSCOPY (EGD) WITH PROPOFOL;  Surgeon: Irene Shipper, MD;  Location: [facility] ENDOSCOPY;  Service: Endoscopy;  Laterality: N/A;  . HEMOSTASIS CLIP PLACEMENT  02/25/2020   Procedure: HEMOSTASIS CLIP PLACEMENT;  Surgeon: Lavena Bullion, DO;  Location: [place];  Service: Gastroenterology;;  . HOT HEMOSTASIS N/A 02/25/2020   Procedure: HOT HEMOSTASIS (ARGON PLASMA COAGULATION/BICAP);  Surgeon: Lavena Bullion, DO;  Location: [facility] ENDOSCOPY;  Service: Gastroenterology;  Laterality: N/A;  . INSERTION OF DIALYSIS CATHETER Right 08/25/2019   Procedure: INSERTION OF DIALYSIS CATHETER;  Surgeon: Waynetta Sandy, MD;   Location: [place];  Service: Vascular;  Laterality: Right;  . IR FLUORO GUIDE CV LINE RIGHT  08/19/2019  . IR US GUIDE VASC ACCESS RIGHT  08/19/2019  . LEFT HEART CATHETERIZATION WITH CORONARY ANGIOGRAM N/A 06/29/2012   Procedure: LEFT HEART CATHETERIZATION WITH CORONARY ANGIOGRAM;  Surgeon: Peter M Martinique, MD;  Location: [facility] CATH LAB;  Service: Cardiovascular;  Laterality: N/A;  . PERIPHERAL VASCULAR ATHERECTOMY Left 02/06/2020   Procedure: PERIPHERAL VASCULAR ATHERECTOMY;  Surgeon: Waynetta Sandy, MD;  Location: [place] CV LAB;  Service: Cardiovascular;  Laterality: Left;  SFA  . PERIPHERAL VASCULAR INTERVENTION Left 02/06/2020   Procedure: PERIPHERAL VASCULAR INTERVENTION;  Surgeon: Waynetta Sandy, MD;  Location: [place] CV LAB;  Service: Cardiovascular;  Laterality: Left;  SFA  . REVISON OF ARTERIOVENOUS FISTULA Left 02/07/2020   Procedure: REVISON OF ARTERIOVENOUS FISTULA;  Surgeon: Waynetta Sandy, MD;  Location: [place];  Service: Vascular;  Laterality: Left;  . STERNAL WOUND DEBRIDEMENT  08/17/2012   Procedure: STERNAL WOUND DEBRIDEMENT;  Surgeon: Ivin Poot, MD;  Location: [place];  Service: Thoracic;  Laterality: N/A;  wound vac application  . STERNAL WOUND DEBRIDEMENT  08/24/2012   Procedure: STERNAL WOUND DEBRIDEMENT;  Surgeon: Tharon Aquas  Kerby Less, MD;  Location: [place];  Service: Thoracic;  Laterality: N/A;  . STERNAL WOUND DEBRIDEMENT  09/01/2012   Procedure: STERNAL WOUND DEBRIDEMENT;  Surgeon: Ivin Poot, MD;  Location: [place];  Service: Thoracic;  Laterality: N/A;  . STERNAL WOUND DEBRIDEMENT  09/20/2012   Procedure: STERNAL WOUND DEBRIDEMENT;  Surgeon: Ivin Poot, MD;  Location: [facility] OR;  Service: Thoracic;  Laterality: N/A;  wound vac change  . SUBMUCOSAL TATTOO INJECTION  10/07/2019   Procedure: SUBMUCOSAL TATTOO INJECTION;  Surgeon: Jackquline Denmark, MD;  Location: [facility] ENDOSCOPY;  Service: Endoscopy;;   Soc Hx - married 25 years. No children. Retired on  disability but worked prior as [employer] Lives with her husband but is currently in SNF after left great toe amputation.    reports that she has been smoking cigarettes. She has a 12.50 pack-year smoking history. She has never used smokeless tobacco. She reports current drug use. Drug: Cocaine. She reports that she does not drink alcohol.  Allergies  Allergen Reactions  . Naproxen Rash    Family History  Problem Relation Age of Onset  . Diabetes Mother   . Hypertension Mother   . Cancer Mother   . Hyperlipidemia Father   . Hypertension Father   . Kidney disease Father   . Gout Father   . Cerebrovascular Accident Father   . Other Other        no known family CAD     Prior to Admission medications   Medication Sig Start Date End Date Taking? Authorizing Provider  albuterol (VENTOLIN HFA) 108 (90 Base) MCG/ACT inhaler Inhale 2 puffs into the lungs every 6 (six) hours as needed for wheezing or shortness of breath.   Yes [provider]  allopurinol (ZYLOPRIM) 100 MG tablet Take 1 tablet (100 mg total) by mouth daily. 02/28/20  Yes Gladys Damme, MD  aspirin 81 MG EC tablet Take 1 tablet (81 mg total) by mouth daily. 02/28/20  Yes Gladys Damme, MD  atorvastatin (LIPITOR) 40 MG tablet Take 1 tablet (40 mg total) by mouth daily at 6 PM. 02/28/20  Yes Gladys Damme, MD  buPROPion [facility] SR) 150 MG 12 hr tablet Take 1 tablet (150 mg total) by mouth 2 (two) times daily. 01/16/16  Yes Mikhail, Velta Addison, DO  cetirizine (ZYRTEC) 10 MG tablet Take 10 mg by mouth daily as needed for allergies.    Yes [provider]  ferrous sulfate 325 (65 FE) MG tablet Take 325 mg by mouth 2 (two) times daily with a meal.    Yes [provider]  gabapentin (NEURONTIN) 300 MG capsule Take 1 capsule (300 mg total) by mouth at bedtime. 02/10/20  Yes Aline August, MD  insulin lispro (HUMALOG) 100 UNIT/ML injection Inject 2 Units into the skin 3 (three) times daily before meals.   Yes  [provider]  latanoprost (XALATAN) 0.005 % ophthalmic solution Place 1 drop into both eyes at bedtime.   Yes [provider]  LINZESS 72 MCG capsule Take 72 mcg by mouth daily before breakfast.  11/21/17  Yes [provider]  metoprolol tartrate (LOPRESSOR) 25 MG tablet Take 1 tablet (25 mg total) by mouth 2 (two) times daily. 02/10/20  Yes Aline August, MD  mometasone-formoterol (DULERA) 100-5 MCG/ACT AERO Inhale 2 puffs into the lungs every 4 (four) hours as needed for wheezing.   Yes [provider]  nitroGLYCERIN (NITROSTAT) 0.4 MG SL tablet Place 1 tablet (0.4 mg total) under the tongue every 5 (  five) minutes as needed for chest pain. 04/30/19  Yes [name], MD  oxyCODONE (OXY IR/ROXICODONE) 5 MG immediate release tablet Take 1 tablet (5 mg total) by mouth every 6 (six) hours as needed for moderate pain. 02/10/20  Yes Rhyne, Samantha J, PA-C  pantoprazole (PROTONIX) 40 MG tablet Take 1 tablet (40 mg total) by mouth daily. 02/28/20 04/03/20 Yes Gladys Damme, MD  polyethylene glycol (MIRALAX) 17 g packet Take 17 g by mouth daily as needed for moderate constipation. 02/10/20  Yes Aline August, MD  TUMS 500 MG chewable tablet Chew 2 tablets by mouth at bedtime. 01/19/20  Yes [provider]  VELPHORO 500 MG chewable tablet Chew 2 tablets (1,000 mg total) by mouth 3 (three) times daily with meals. 02/10/20  Yes Aline August, MD  insulin aspart (NOVOLOG) 100 UNIT/ML injection Inject 2 Units into the skin 3 (three) times daily with meals. Patient not taking: Reported on 03/16/2020 02/10/20   Aline August, MD    Physical Exam: Vitals:   04/03/20 1542 04/03/20 1545 04/03/20 1548  BP:  (!) 110/48   Pulse:  (!) 57   Resp:  (!) 21   Temp:  98.8 F (37.1 C)   SpO2: 98% 100%   Weight:   118.4 kg  Height:   5\' 8"  (1.727 m)    Constitutional: NAD, calm, comfortable Vitals:   04/03/20 1542 04/03/20 1545 04/03/20 1548  BP:  (!) 110/48   Pulse:  (!)  57   Resp:  (!) 21   Temp:  98.8 F (37.1 C)   SpO2: 98% 100%   Weight:   118.4 kg  Height:   5\' 8"  (1.727 m)   General appearance: morbidly obese woman in no distress Eyes: PERRL, lids and conjunctivae normal ENMT: Mucous membranes are moist. Posterior pharynx clear of any exudate or lesions.Edentulous..  Neck: normal, supple, no masses, no thyromegaly Respiratory: clear to auscultation bilaterally but decreased air movement, no wheezing, no crackles. Normal respiratory effort. No accessory muscle use.  Cardiovascular: IRIR rate controlled, II/VI systolic  Murmur RSB / no rubs / no gallops. 2+ to mid-leg extremity edema. 1+ pedal pulses. No carotid bruits.  Abdomen: obese, no tenderness, no masses palpated. No hepatosplenomegaly-limited exam by girth.. Bowel sounds positive.  Musculoskeletal: no clubbing / cyanosis. No joint deformity upper and lower extremities. Good ROM, no contractures. Normal muscle tone.  Skin: Bandaged left foot/great toe - dressing dry. Multiple abrasions right foot over PIP joints and at side of great toe. Tender to touch. No plantar lesion Neurologic: CN 2-12 grossly intact. Sensation decreased. Strength 4/5 in all 4.  Psychiatric: Normal judgment and insight. Alert and oriented x 3. Normal mood.     Labs on Admission: I have personally reviewed following labs and imaging studies  CBC: Recent Labs  Lab 04/03/20 1630  WBC 8.9  NEUTROABS 7.3  HGB 6.8*  HCT 22.4*  MCV 91.4  PLT 315   Basic Metabolic Panel: Recent Labs  Lab 04/03/20 1630  NA 137  K 3.9  CL 94*  CO2 28  GLUCOSE 149*  BUN 36*  CREATININE 4.87*  CALCIUM 8.1*   GFR: Estimated Creatinine Clearance: 14.8 mL/min (A) (by C-G formula based on SCr of 4.87 mg/dL (H)). Liver Function Tests: No results for input(s): AST, ALT, ALKPHOS, BILITOT, PROT, ALBUMIN in the last 168 hours. No results for input(s): LIPASE, AMYLASE in the last 168 hours. No results for input(s): AMMONIA in the last  168 hours. Coagulation Profile: Recent  Labs  Lab 04/03/20 1630  INR 1.2   Cardiac Enzymes: No results for input(s): CKTOTAL, CKMB, CKMBINDEX, TROPONINI in the last 168 hours. BNP (last 3 results) No results for input(s): PROBNP in the last 8760 hours. HbA1C: No results for input(s): HGBA1C in the last 72 hours. CBG: No results for input(s): GLUCAP in the last 168 hours. Lipid Profile: No results for input(s): CHOL, HDL, LDLCALC, TRIG, CHOLHDL, LDLDIRECT in the last 72 hours. Thyroid Function Tests: No results for input(s): TSH, T4TOTAL, FREET4, T3FREE, THYROIDAB in the last 72 hours. Anemia Panel: No results for input(s): VITAMINB12, FOLATE, FERRITIN, TIBC, IRON, RETICCTPCT in the last 72 hours. Urine analysis:    Component Value Date/Time   COLORURINE YELLOW 08/19/2019 0712   APPEARANCEUR HAZY (A) 08/19/2019 0712   LABSPEC 1.012 08/19/2019 0712   PHURINE 5.0 08/19/2019 0712   GLUCOSEU NEGATIVE 08/19/2019 0712   HGBUR NEGATIVE 08/19/2019 0712   BILIRUBINUR NEGATIVE 08/19/2019 0712   KETONESUR NEGATIVE 08/19/2019 0712   PROTEINUR NEGATIVE 08/19/2019 0712   UROBILINOGEN 0.2 05/12/2015 0938   NITRITE NEGATIVE 08/19/2019 0712   LEUKOCYTESUR SMALL (A) 08/19/2019 0712    Radiological Exams on Admission: No results found.  EKG: Independently reviewed. A fib, LVH, no acute changes, no changes from prior EKG  Assessment/Plan Active Problems:   Symptomatic anemia   Essential hypertension   Type 2 diabetes mellitus with chronic kidney disease on chronic dialysis, with long-term current use of insulin (HCC)   Chronic combined systolic and diastolic CHF (congestive heart failure) ([place])   ESRD (end stage renal disease) ([place])  (please populate well all problems here in Problem List. (For example, if patient is on BP meds at home and you resume or decide to hold them, it is a problem that needs to be her. Same for CAD, COPD, HLD and so on)   1. Anemia - no source of active  bleeding noted. Prior admission with esophageal erosions and colonic AVMs. May also have chronic anemia associated with ESRD. Prior guidance was to keep Hgb 8+ Plan Observation admit to med-surg  Transfuse 1 u PRBCs with H/H 2 hours after  2. DM - continue home regimen and sliding scale  3. Cardiac - no c/p or other cardiac symptoms. No evidence of decompensated HF. Plan Continue home meds  4. HTN - stable  5. ESRD - HD on TuTh,Sat. Had recent redo of vascular access - stable.  In summar - brief admission for transfusion in a complex patient with multiple comorbidites.     DVT prophylaxis:  SCDs Code Status: full code Family Communication: spoke with her [place].  Disposition Plan: Return to SNF 245-48 hrs  Consults called: none (with names) Admission status: observation    Adella Hare MD [facility] Pager [phone]  If 7PM-7AM, please contact night-coverage [email or web address] Password [facility]  04/03/2020, 6:40 PM

## 2020-04-04 ENCOUNTER — Observation Stay (HOSPITAL_COMMUNITY): Payer: Medicare (Managed Care)

## 2020-04-04 DIAGNOSIS — I5042 Chronic combined systolic (congestive) and diastolic (congestive) heart failure: Secondary | ICD-10-CM | POA: Diagnosis not present

## 2020-04-04 DIAGNOSIS — I10 Essential (primary) hypertension: Secondary | ICD-10-CM | POA: Diagnosis not present

## 2020-04-04 DIAGNOSIS — D649 Anemia, unspecified: Secondary | ICD-10-CM | POA: Diagnosis not present

## 2020-04-04 DIAGNOSIS — Z794 Long term (current) use of insulin: Secondary | ICD-10-CM

## 2020-04-04 DIAGNOSIS — N186 End stage renal disease: Secondary | ICD-10-CM | POA: Diagnosis not present

## 2020-04-04 LAB — BPAM RBC
Blood Product Expiration Date: 202105052359
ISSUE DATE / TIME: 202104062241
Unit Type and Rh: 5100

## 2020-04-04 LAB — TYPE AND SCREEN
ABO/RH(D): O POS
Antibody Screen: NEGATIVE
Unit division: 0

## 2020-04-04 LAB — SARS CORONAVIRUS 2 (TAT 6-24 HRS): SARS Coronavirus 2: NEGATIVE

## 2020-04-04 LAB — HEMOGLOBIN AND HEMATOCRIT, BLOOD
HCT: 25 % — ABNORMAL LOW (ref 36.0–46.0)
Hemoglobin: 7.6 g/dL — ABNORMAL LOW (ref 12.0–15.0)

## 2020-04-04 LAB — GLUCOSE, CAPILLARY
Glucose-Capillary: 92 mg/dL (ref 70–99)
Glucose-Capillary: 130 mg/dL — ABNORMAL HIGH (ref 70–99)
Glucose-Capillary: 89 mg/dL (ref 70–99)
Glucose-Capillary: 172 mg/dL — ABNORMAL HIGH (ref 70–99)
Glucose-Capillary: 136 mg/dL — ABNORMAL HIGH (ref 70–99)

## 2020-04-04 MED ORDER — CHLORHEXIDINE GLUCONATE CLOTH 2 % EX PADS
6.0000 | MEDICATED_PAD | Freq: Every day | CUTANEOUS | Status: DC
  Administered 2020-04-04 – 2020-04-15 (×10): 6 via TOPICAL

## 2020-04-04 MED ORDER — ACETAMINOPHEN 325 MG PO TABS
650.0000 mg | ORAL_TABLET | Freq: Four times a day (QID) | ORAL | Status: DC | PRN
  Administered 2020-04-04 – 2020-04-19 (×16): 650 mg via ORAL
  Filled 2020-04-04 (×17): qty 2

## 2020-04-04 NOTE — Progress Notes (Signed)
PTAR arrived to transport patient to [facility].  Patient's vital signs had changed (see flowsheet) so discharge has been cancelled for today.  [place] aware.

## 2020-04-04 NOTE — Care Management CC44 (Signed)
Condition Code 44 Documentation Completed  Patient Details  Name: [name] [name] MRN: [id number] Date of Birth: Jan 06, 1951   Condition Code 44 given:  Yes Patient signature on Condition Code 44 notice:  Yes Documentation of 2 MD's agreement:  Yes Code 44 added to claim:  Yes    Trish Mage, LCSW 04/04/2020, 2:00 PM

## 2020-04-04 NOTE — TOC Transition Note (Signed)
Transition of Care [facility]) - CM/SW Discharge Note   Patient Details  Name: [name] [name] MRN: [id number] Date of Birth: 04/19/1951  [facility] of [facility]) CM/SW Contact:  Trish Mage, LCSW Phone Number: 04/04/2020, 2:11 PM   Clinical Narrative:   [name] will return to [facility] today.  PTAR arranged.  Nursing, please call report to [phone]. TOC sign off.    Final next level of care: Skilled Nursing Facility Barriers to Discharge: No Barriers Identified   Patient Goals and CMS Choice        Discharge Placement                       Discharge Plan and Services                                     Social Determinants of Health (SDOH) Interventions     Readmission Risk Interventions Readmission Risk Prevention Plan 12/12/2019 10/08/2019 08/26/2019  Transportation Screening Complete - Complete  Medication Review Press photographer) Complete - Complete  PCP or Specialist appointment within 3-5 days of discharge Complete - Complete  HRI or [place] - - Complete  SW Recovery Care/Counseling Consult - - Complete  [place] - - Not Applicable  Some recent data might be hidden

## 2020-04-04 NOTE — Progress Notes (Signed)
Verbal report given to Transporter.  Patient on stretcher and will be taken to [facility].  Care link given Name and # for Receiving RN. Off 5E at this time.

## 2020-04-04 NOTE — Significant Event (Addendum)
Patient was arranged for discharge back to skilled nursing facility but subsequently patient spiked a fever and was borderline hypotensive with tachycardia.  T-max was noted at 100.4 F with heart rate up to 140/min and blood pressure was low at upper 90s.  At this time patient is unsafe for discharge to skilled nursing facility. Will obtain CXR, blood cultures.  Patient is due for hemodialysis tomorrow.  Patient will need to be transferred to [facility] and will put for transfer.  I also spoke with Dr. Johnney Ou, nephrology regarding the need for dialysis in a.m.  Informed patient care coordinator regarding transfer.

## 2020-04-04 NOTE — Plan of Care (Signed)
  Problem: Education: Goal: Knowledge of disease and its progression will improve Outcome: Adequate for Discharge Goal: Individualized Educational Video(s) Outcome: Adequate for Discharge   Problem: Fluid Volume: Goal: Compliance with measures to maintain balanced fluid volume will improve Outcome: Adequate for Discharge   Problem: Health Behavior/Discharge Planning: Goal: Ability to manage health-related needs will improve Outcome: Adequate for Discharge   Problem: Nutritional: Goal: Ability to make healthy dietary choices will improve Outcome: Adequate for Discharge   Problem: Clinical Measurements: Goal: Complications related to the disease process, condition or treatment will be avoided or minimized Outcome: Adequate for Discharge

## 2020-04-04 NOTE — Discharge Summary (Signed)
Physician Discharge Summary  [name] [name] HER:[id number] DOB: 1951/09/01 DOA: 04/03/2020  PCP: [place] date: 04/03/2020 Discharge date: 04/04/2020  Admitted From: SNF  Discharge disposition: SNF   Recommendations for Outpatient Follow-Up:   . Follow up with your primary care provider at the skilled nursing facility in 3 to 5 days.  .  Resume home medications. . Check CBC BMP as scheduled during dialysis or with PCP. . Transfusion as needed  . Continue hemodialysis as scheduled.   Discharge Diagnosis:   Active Problems:   Essential hypertension   Type 2 diabetes mellitus with chronic kidney disease on chronic dialysis, with long-term current use of insulin (HCC)   Chronic combined systolic and diastolic CHF (congestive heart failure) (HCC)   ESRD (end stage renal disease) (HCC)   Symptomatic anemia   Discharge Condition: Improved.  Diet recommendation: Low sodium, heart healthy.  Carbohydrate-modified.   Wound care: None.  Code status: Full.   History of Present Illness:    [name] [name] is a 69 y.o. female followed by Familypractice service with medical history significant of CAD s/p CABG, combined CHF, substance abuse, insulin dependent DM, h/oCVA with prior admission for anemia.  Patient does have a history of esophageal erosions and colonic AVMs.  Patient was seen in the hospital from dialysis session since she was found to have a hemoglobin of 6.8.  Patient had some fatigue but denied any chest pain or active bleeding ED Course: Hemodynamically stable. Lab confirmed Hgb 6.8 g. Labs otherwise unremarkable. EKG with A. Fib. No evidence of decompensated CHF.  Patient was then placed in as observation. Hospital Course:   Following conditions were addressed during hospitalization as listed below,  Anemia of chronic kidney disease.  Patient was symptomatic on presentation with fatigue and weakness.  Has improved after 1 unit PRBC  transfusion.  History of esophageal erosions and colonic AVMs.  No active bleeding.    Diabetes mellitus type 2.  Patient will resume home medication regimen on discharge.  History of combined congestive heart failure, chronic.  Compensated at this time.  Resume home medication.  Essential hypertension.  Remained stable.  Resume home medication on discharge  End-stage renal disease on hemodialysis Tuesday Thursday Saturday.  Patient will resume hemodialysis after discharge.   Disposition.  At this time, patient is stable for disposition to skilled nursing facility.  Medical Consultants:    None.  Procedures:    Transfusion of 1 unit of packed RBC Subjective:   Today, patient feels better with fatigue and weakness.  Wishes to be discharged.  Denies any bleeding.  Discharge Exam:   Vitals:   04/04/20 0130 04/04/20 0515  BP: 94/66 (!) 109/47  Pulse: 65 (!) 56  Resp: 16 17  Temp: 98.8 F (37.1 C) 99.3 F (37.4 C)  SpO2: 99% 100%   Vitals:   04/03/20 2253 04/03/20 2311 04/04/20 0130 04/04/20 0515  BP: (!) 105/50 (!) 105/47 94/66 (!) 109/47  Pulse: 65 (!) 104 65 (!) 56  Resp:  18 16 17   Temp: 98.8 F (37.1 C) 98.6 F (37 C) 98.8 F (37.1 C) 99.3 F (37.4 C)  TempSrc: Oral Oral Oral Oral  SpO2: 98% 96% 99% 100%  Weight:      Height:       Body mass index is 39.68 kg/m.  General: Alert awake, not in obvious distress, morbidly obese HENT: pupils equally reacting to light, mild pallor noted, oral mucosa is moist.  Chest:  Clear breath sounds.  Diminished breath sounds bilaterally. No crackles or wheezes.  CVS: S1 &S2 heard. No murmur.  Regular rate and rhythm.  CABG scar noted Abdomen: Soft, nontender, nondistended.  Bowel sounds are heard.   Extremities: No cyanosis, clubbing, trace pedal edema peripheral pulses are palpable. Psych: Alert, awake and oriented, normal mood CNS:  No cranial nerve deficits.  Power equal in all extremities.   Skin: Warm and dry.  No  rashes noted.  The results of significant diagnostics from this hospitalization (including imaging, microbiology, ancillary and laboratory) are listed below for reference.     Diagnostic Studies:   No results found.   Labs:   Basic Metabolic Panel: Recent Labs  Lab 04/03/20 1630  NA 137  K 3.9  CL 94*  CO2 28  GLUCOSE 149*  BUN 36*  CREATININE 4.87*  CALCIUM 8.1*   GFR Estimated Creatinine Clearance: 14.8 mL/min (A) (by C-G formula based on SCr of 4.87 mg/dL (H)). Liver Function Tests: No results for input(s): AST, ALT, ALKPHOS, BILITOT, PROT, ALBUMIN in the last 168 hours. No results for input(s): LIPASE, AMYLASE in the last 168 hours. No results for input(s): AMMONIA in the last 168 hours. Coagulation profile Recent Labs  Lab 04/03/20 1630  INR 1.2    CBC: Recent Labs  Lab 04/03/20 1630 04/04/20 0309  WBC 8.9  --   NEUTROABS 7.3  --   HGB 6.8* 7.6*  HCT 22.4* 25.0*  MCV 91.4  --   PLT 269  --    Cardiac Enzymes: No results for input(s): CKTOTAL, CKMB, CKMBINDEX, TROPONINI in the last 168 hours. BNP: Invalid input(s): POCBNP CBG: Recent Labs  Lab 04/03/20 2108 04/04/20 0725  GLUCAP 155* 92   D-Dimer No results for input(s): DDIMER in the last 72 hours. Hgb A1c No results for input(s): HGBA1C in the last 72 hours. Lipid Profile No results for input(s): CHOL, HDL, LDLCALC, TRIG, CHOLHDL, LDLDIRECT in the last 72 hours. Thyroid function studies No results for input(s): TSH, T4TOTAL, T3FREE, THYROIDAB in the last 72 hours.  Invalid input(s): FREET3 Anemia work up No results for input(s): VITAMINB12, FOLATE, FERRITIN, TIBC, IRON, RETICCTPCT in the last 72 hours. Microbiology Recent Results (from the past 240 hour(s))  SARS CORONAVIRUS 2 (TAT 6-24 HRS) Nasopharyngeal Nasopharyngeal Swab     Status: None   Collection Time: 04/03/20  7:00 PM   Specimen: Nasopharyngeal Swab  Result Value Ref Range Status   SARS Coronavirus 2 NEGATIVE NEGATIVE  Final    Comment: (NOTE) SARS-CoV-2 target nucleic acids are NOT DETECTED. The SARS-CoV-2 RNA is generally detectable in upper and lower respiratory specimens during the acute phase of infection. Negative results do not preclude SARS-CoV-2 infection, do not rule out co-infections with other pathogens, and should not be used as the sole basis for treatment or other patient management decisions. Negative results must be combined with clinical observations, patient history, and epidemiological information. The expected result is Negative. Fact Sheet for Patients: SugarRoll.be Fact Sheet for Healthcare Providers: https://www.woods-mathews.com/ This test is not yet approved or cleared by the [place] FDA and  has been authorized for detection and/or diagnosis of SARS-CoV-2 by FDA under an Emergency Use Authorization (EUA). This EUA will remain  in effect (meaning this test can be used) for the duration of the COVID-19 declaration under Section 56 4(b)(1) of the Act, 21 U.S.C. section 360bbb-3(b)(1), unless the authorization is terminated or revoked sooner. Performed at [facility], [place]  [address], [place], [place] [address]      Discharge Instructions:   Discharge Instructions    Diet - low sodium heart healthy   Complete by: As directed    Discharge instructions   Complete by: As directed    Follow-up with your primary care provider at the skilled nursing facility in 3 to 5 days.  Continue hemodialysis as scheduled.   Increase activity slowly   Complete by: As directed      Allergies as of 04/04/2020      Reactions   Naproxen Rash      Medication List    STOP taking these medications   insulin aspart 100 UNIT/ML injection Commonly known as: novoLOG     TAKE these medications   albuterol 108 (90 Base) MCG/ACT inhaler Commonly known as: VENTOLIN HFA Inhale 2 puffs into the lungs every 6 (six) hours as needed for  wheezing or shortness of breath.   allopurinol 100 MG tablet Commonly known as: ZYLOPRIM Take 1 tablet (100 mg total) by mouth daily.   aspirin 81 MG EC tablet Take 1 tablet (81 mg total) by mouth daily.   atorvastatin 40 MG tablet Commonly known as: LIPITOR Take 1 tablet (40 mg total) by mouth daily at 6 PM.   buPROPion 150 MG 12 hr tablet Commonly known as: WELLBUTRIN SR Take 1 tablet (150 mg total) by mouth 2 (two) times daily.   cetirizine 10 MG tablet Commonly known as: ZYRTEC Take 10 mg by mouth daily as needed for allergies.   ferrous sulfate 325 (65 FE) MG tablet Take 325 mg by mouth 2 (two) times daily with a meal.   gabapentin 300 MG capsule Commonly known as: NEURONTIN Take 1 capsule (300 mg total) by mouth at bedtime.   insulin lispro 100 UNIT/ML injection Commonly known as: HUMALOG Inject 2 Units into the skin 3 (three) times daily before meals.   latanoprost 0.005 % ophthalmic solution Commonly known as: XALATAN Place 1 drop into both eyes at bedtime.   Linzess 72 MCG capsule Generic drug: linaclotide Take 72 mcg by mouth daily before breakfast.   metoprolol tartrate 25 MG tablet Commonly known as: LOPRESSOR Take 1 tablet (25 mg total) by mouth 2 (two) times daily.   mometasone-formoterol 100-5 MCG/ACT Aero Commonly known as: DULERA Inhale 2 puffs into the lungs every 4 (four) hours as needed for wheezing.   nitroGLYCERIN 0.4 MG SL tablet Commonly known as: NITROSTAT Place 1 tablet (0.4 mg total) under the tongue every 5 (five) minutes as needed for chest pain.   oxyCODONE 5 MG immediate release tablet Commonly known as: Oxy IR/ROXICODONE Take 1 tablet (5 mg total) by mouth every 6 (six) hours as needed for moderate pain.   pantoprazole 40 MG tablet Commonly known as: Protonix Take 1 tablet (40 mg total) by mouth daily.   polyethylene glycol 17 g packet Commonly known as: MiraLax Take 17 g by mouth daily as needed for moderate  constipation.   Tums 500 MG chewable tablet Generic drug: calcium carbonate Chew 2 tablets by mouth at bedtime.   Velphoro 500 MG chewable tablet Generic drug: sucroferric oxyhydroxide Chew 2 tablets (1,000 mg total) by mouth 3 (three) times daily with meals.        Time coordinating discharge: 39 minutes  Signed:  Kennadi Albany  [facility] 04/04/2020, 11:25 AM

## 2020-04-04 NOTE — Care Management Obs Status (Signed)
[place]   Patient Details  Name: [name] [name] MRN: [id number] Date of Birth: 1951/10/22   Medicare Observation Status Notification Given:  Yes    Trish Mage, LCSW 04/04/2020, 1:59 PM

## 2020-04-04 NOTE — Progress Notes (Signed)
SBAR hand off to [place] [facility]. [facility] to transport.

## 2020-04-04 NOTE — Plan of Care (Signed)
Plan of care discussed.   

## 2020-04-04 NOTE — Plan of Care (Signed)
In need of 1 unit of PRBC's r/t   Hgb of 6.8 - post dialysis- safety measures are in place

## 2020-04-04 NOTE — Progress Notes (Signed)
Report called to [facility] [place] at this time

## 2020-04-05 DIAGNOSIS — I248 Other forms of acute ischemic heart disease: Secondary | ICD-10-CM | POA: Diagnosis not present

## 2020-04-05 DIAGNOSIS — Z20822 Contact with and (suspected) exposure to covid-19: Secondary | ICD-10-CM | POA: Diagnosis not present

## 2020-04-05 DIAGNOSIS — Z89422 Acquired absence of other left toe(s): Secondary | ICD-10-CM | POA: Diagnosis not present

## 2020-04-05 DIAGNOSIS — M86679 Other chronic osteomyelitis, unspecified ankle and foot: Secondary | ICD-10-CM | POA: Diagnosis not present

## 2020-04-05 DIAGNOSIS — L03116 Cellulitis of left lower limb: Secondary | ICD-10-CM | POA: Diagnosis present

## 2020-04-05 DIAGNOSIS — R509 Fever, unspecified: Secondary | ICD-10-CM | POA: Diagnosis not present

## 2020-04-05 DIAGNOSIS — L97929 Non-pressure chronic ulcer of unspecified part of left lower leg with unspecified severity: Secondary | ICD-10-CM | POA: Diagnosis present

## 2020-04-05 DIAGNOSIS — K31819 Angiodysplasia of stomach and duodenum without bleeding: Secondary | ICD-10-CM | POA: Diagnosis not present

## 2020-04-05 DIAGNOSIS — K921 Melena: Secondary | ICD-10-CM | POA: Diagnosis not present

## 2020-04-05 DIAGNOSIS — I5042 Chronic combined systolic (congestive) and diastolic (congestive) heart failure: Secondary | ICD-10-CM | POA: Diagnosis not present

## 2020-04-05 DIAGNOSIS — M86179 Other acute osteomyelitis, unspecified ankle and foot: Secondary | ICD-10-CM | POA: Diagnosis not present

## 2020-04-05 DIAGNOSIS — K922 Gastrointestinal hemorrhage, unspecified: Secondary | ICD-10-CM

## 2020-04-05 DIAGNOSIS — N2581 Secondary hyperparathyroidism of renal origin: Secondary | ICD-10-CM | POA: Diagnosis not present

## 2020-04-05 DIAGNOSIS — Z6841 Body Mass Index (BMI) 40.0 and over, adult: Secondary | ICD-10-CM | POA: Diagnosis not present

## 2020-04-05 DIAGNOSIS — M86672 Other chronic osteomyelitis, left ankle and foot: Secondary | ICD-10-CM | POA: Diagnosis not present

## 2020-04-05 DIAGNOSIS — A419 Sepsis, unspecified organism: Secondary | ICD-10-CM | POA: Diagnosis not present

## 2020-04-05 DIAGNOSIS — I96 Gangrene, not elsewhere classified: Secondary | ICD-10-CM | POA: Diagnosis not present

## 2020-04-05 DIAGNOSIS — D631 Anemia in chronic kidney disease: Secondary | ICD-10-CM | POA: Diagnosis present

## 2020-04-05 DIAGNOSIS — H409 Unspecified glaucoma: Secondary | ICD-10-CM | POA: Diagnosis not present

## 2020-04-05 DIAGNOSIS — Z951 Presence of aortocoronary bypass graft: Secondary | ICD-10-CM | POA: Diagnosis not present

## 2020-04-05 DIAGNOSIS — R6510 Systemic inflammatory response syndrome (SIRS) of non-infectious origin without acute organ dysfunction: Secondary | ICD-10-CM | POA: Diagnosis not present

## 2020-04-05 DIAGNOSIS — I739 Peripheral vascular disease, unspecified: Secondary | ICD-10-CM | POA: Diagnosis not present

## 2020-04-05 DIAGNOSIS — E1152 Type 2 diabetes mellitus with diabetic peripheral angiopathy with gangrene: Secondary | ICD-10-CM | POA: Diagnosis not present

## 2020-04-05 DIAGNOSIS — Z794 Long term (current) use of insulin: Secondary | ICD-10-CM | POA: Diagnosis not present

## 2020-04-05 DIAGNOSIS — M19012 Primary osteoarthritis, left shoulder: Secondary | ICD-10-CM | POA: Diagnosis present

## 2020-04-05 DIAGNOSIS — D649 Anemia, unspecified: Secondary | ICD-10-CM | POA: Diagnosis present

## 2020-04-05 DIAGNOSIS — F1410 Cocaine abuse, uncomplicated: Secondary | ICD-10-CM | POA: Diagnosis present

## 2020-04-05 DIAGNOSIS — K31811 Angiodysplasia of stomach and duodenum with bleeding: Secondary | ICD-10-CM | POA: Diagnosis not present

## 2020-04-05 DIAGNOSIS — E1122 Type 2 diabetes mellitus with diabetic chronic kidney disease: Secondary | ICD-10-CM | POA: Diagnosis present

## 2020-04-05 DIAGNOSIS — M8610 Other acute osteomyelitis, unspecified site: Secondary | ICD-10-CM | POA: Diagnosis present

## 2020-04-05 DIAGNOSIS — I2510 Atherosclerotic heart disease of native coronary artery without angina pectoris: Secondary | ICD-10-CM | POA: Diagnosis present

## 2020-04-05 DIAGNOSIS — Z992 Dependence on renal dialysis: Secondary | ICD-10-CM | POA: Diagnosis not present

## 2020-04-05 DIAGNOSIS — R609 Edema, unspecified: Secondary | ICD-10-CM | POA: Diagnosis not present

## 2020-04-05 DIAGNOSIS — K920 Hematemesis: Secondary | ICD-10-CM | POA: Diagnosis not present

## 2020-04-05 DIAGNOSIS — D62 Acute posthemorrhagic anemia: Secondary | ICD-10-CM | POA: Diagnosis not present

## 2020-04-05 DIAGNOSIS — N186 End stage renal disease: Secondary | ICD-10-CM | POA: Diagnosis not present

## 2020-04-05 DIAGNOSIS — I132 Hypertensive heart and chronic kidney disease with heart failure and with stage 5 chronic kidney disease, or end stage renal disease: Secondary | ICD-10-CM | POA: Diagnosis not present

## 2020-04-05 LAB — CBC
HCT: 22.9 % — ABNORMAL LOW (ref 36.0–46.0)
HCT: 26 % — ABNORMAL LOW (ref 36.0–46.0)
Hemoglobin: 6.9 g/dL — CL (ref 12.0–15.0)
Hemoglobin: 8 g/dL — ABNORMAL LOW (ref 12.0–15.0)
MCH: 28 pg (ref 26.0–34.0)
MCH: 28.2 pg (ref 26.0–34.0)
MCHC: 30.1 g/dL (ref 30.0–36.0)
MCHC: 30.8 g/dL (ref 30.0–36.0)
MCV: 93.1 fL (ref 80.0–100.0)
MCV: 91.5 fL (ref 80.0–100.0)
Platelets: 275 10*3/uL (ref 150–400)
Platelets: 296 10*3/uL (ref 150–400)
RBC: 2.46 MIL/uL — ABNORMAL LOW (ref 3.87–5.11)
RBC: 2.84 MIL/uL — ABNORMAL LOW (ref 3.87–5.11)
RDW: 18.3 % — ABNORMAL HIGH (ref 11.5–15.5)
RDW: 18.5 % — ABNORMAL HIGH (ref 11.5–15.5)
WBC: 8.8 10*3/uL (ref 4.0–10.5)
WBC: 10.1 10*3/uL (ref 4.0–10.5)
nRBC: 0.6 % — ABNORMAL HIGH (ref 0.0–0.2)
nRBC: 0.5 % — ABNORMAL HIGH (ref 0.0–0.2)

## 2020-04-05 LAB — PROCALCITONIN: Procalcitonin: 0.42 ng/mL

## 2020-04-05 LAB — RENAL FUNCTION PANEL
Albumin: 2.7 g/dL — ABNORMAL LOW (ref 3.5–5.0)
Anion gap: 17 — ABNORMAL HIGH (ref 5–15)
BUN: 60 mg/dL — ABNORMAL HIGH (ref 8–23)
CO2: 27 mmol/L (ref 22–32)
Calcium: 8.5 mg/dL — ABNORMAL LOW (ref 8.9–10.3)
Chloride: 94 mmol/L — ABNORMAL LOW (ref 98–111)
Creatinine, Ser: 8.04 mg/dL — ABNORMAL HIGH (ref 0.44–1.00)
GFR calc Af Amer: 5 mL/min — ABNORMAL LOW (ref >60)
GFR calc non Af Amer: 5 mL/min — ABNORMAL LOW (ref >60)
Glucose, Bld: 128 mg/dL — ABNORMAL HIGH (ref 70–99)
Phosphorus: 7.1 mg/dL — ABNORMAL HIGH (ref 2.5–4.6)
Potassium: 4.3 mmol/L (ref 3.5–5.1)
Sodium: 138 mmol/L (ref 135–145)

## 2020-04-05 LAB — GLUCOSE, CAPILLARY
Glucose-Capillary: 121 mg/dL — ABNORMAL HIGH (ref 70–99)
Glucose-Capillary: 209 mg/dL — ABNORMAL HIGH (ref 70–99)
Glucose-Capillary: 99 mg/dL (ref 70–99)
Glucose-Capillary: 87 mg/dL (ref 70–99)
Glucose-Capillary: 140 mg/dL — ABNORMAL HIGH (ref 70–99)

## 2020-04-05 LAB — SURGICAL PCR SCREEN
MRSA, PCR: NEGATIVE
Staphylococcus aureus: NEGATIVE

## 2020-04-05 LAB — PREPARE RBC (CROSSMATCH)

## 2020-04-05 LAB — HEMOGLOBIN A1C
Hgb A1c MFr Bld: 5.1 % (ref 4.8–5.6)
Mean Plasma Glucose: 100 mg/dL

## 2020-04-05 MED ORDER — PEG-KCL-NACL-NASULF-NA ASC-C 100 G PO SOLR
0.5000 | Freq: Once | ORAL | Status: AC
  Administered 2020-04-05: 23:00:00 100 g via ORAL
  Filled 2020-04-05: qty 1

## 2020-04-05 MED ORDER — PEG-KCL-NACL-NASULF-NA ASC-C 100 G PO SOLR: 1.0000 | Freq: Once | ORAL | Status: DC

## 2020-04-05 MED ORDER — CHLORHEXIDINE GLUCONATE CLOTH 2 % EX PADS
6.0000 | MEDICATED_PAD | Freq: Every day | CUTANEOUS | Status: DC
  Administered 2020-04-05 – 2020-04-14 (×9): 6 via TOPICAL

## 2020-04-05 MED ORDER — METOCLOPRAMIDE HCL 5 MG/ML IJ SOLN
5.0000 mg | Freq: Once | INTRAMUSCULAR | Status: AC
  Administered 2020-04-05: 23:00:00 5 mg via INTRAVENOUS
  Filled 2020-04-05: qty 2

## 2020-04-05 MED ORDER — BISACODYL 5 MG PO TBEC
20.0000 mg | DELAYED_RELEASE_TABLET | Freq: Once | ORAL | Status: AC
  Administered 2020-04-05: 20 mg via ORAL
  Filled 2020-04-05: qty 4

## 2020-04-05 MED ORDER — PEG-KCL-NACL-NASULF-NA ASC-C 100 G PO SOLR
0.5000 | Freq: Once | ORAL | Status: AC
  Administered 2020-04-05: 18:00:00 100 g via ORAL
  Filled 2020-04-05: qty 1

## 2020-04-05 MED ORDER — METOCLOPRAMIDE HCL 5 MG/ML IJ SOLN
5.0000 mg | Freq: Once | INTRAMUSCULAR | Status: AC
  Administered 2020-04-05: 5 mg via INTRAVENOUS
  Filled 2020-04-05: qty 2

## 2020-04-05 MED ORDER — SUCROFERRIC OXYHYDROXIDE 500 MG PO CHEW
1000.0000 mg | CHEWABLE_TABLET | Freq: Three times a day (TID) | ORAL | Status: DC
  Administered 2020-04-05 – 2020-04-18 (×30): 1000 mg via ORAL
  Filled 2020-04-05 (×35): qty 2

## 2020-04-05 MED ORDER — SODIUM CHLORIDE 0.9% IV SOLUTION: Freq: Once | INTRAVENOUS | Status: AC

## 2020-04-05 MED ORDER — FERROUS SULFATE 325 (65 FE) MG PO TABS
325.0000 mg | ORAL_TABLET | Freq: Two times a day (BID) | ORAL | Status: DC
  Administered 2020-04-06 – 2020-04-08 (×3): 325 mg via ORAL
  Filled 2020-04-05 (×4): qty 1

## 2020-04-05 NOTE — Progress Notes (Signed)
Lab called and stated that hemoglobin is 6.7.  texted Dr Berle Mull and made him aware

## 2020-04-05 NOTE — Plan of Care (Signed)
  Problem: Education: Goal: Knowledge of disease and its progression will improve Outcome: Progressing Goal: Individualized Educational Video(s) Outcome: Progressing   Problem: Fluid Volume: Goal: Compliance with measures to maintain balanced fluid volume will improve Outcome: Progressing   Problem: Health Behavior/Discharge Planning: Goal: Ability to manage health-related needs will improve Outcome: Progressing   Problem: Nutritional: Goal: Ability to make healthy dietary choices will improve Outcome: Progressing   Problem: Clinical Measurements: Goal: Complications related to the disease process, condition or treatment will be avoided or minimized Outcome: Progressing   Problem: Education: Goal: Knowledge of General Education information will improve Description: Including pain rating scale, medication(s)/side effects and non-pharmacologic comfort measures Outcome: Progressing   Problem: Health Behavior/Discharge Planning: Goal: Ability to manage health-related needs will improve Outcome: Progressing   Problem: Clinical Measurements: Goal: Ability to maintain clinical measurements within normal limits will improve Outcome: Progressing Goal: Will remain free from infection Outcome: Progressing Goal: Diagnostic test results will improve Outcome: Progressing Goal: Respiratory complications will improve Outcome: Progressing Goal: Cardiovascular complication will be avoided Outcome: Progressing   Problem: Activity: Goal: Risk for activity intolerance will decrease Outcome: Progressing   Problem: Nutrition: Goal: Adequate nutrition will be maintained Outcome: Progressing   Problem: Coping: Goal: Level of anxiety will decrease Outcome: Progressing   Problem: Elimination: Goal: Will not experience complications related to bowel motility Outcome: Progressing Goal: Will not experience complications related to urinary retention Outcome: Progressing   Problem: Pain  Managment: Goal: General experience of comfort will improve Outcome: Progressing   Problem: Safety: Goal: Ability to remain free from injury will improve Outcome: Progressing   Problem: Skin Integrity: Goal: Risk for impaired skin integrity will decrease Outcome: Progressing   

## 2020-04-05 NOTE — NC FL2 (Signed)
[place] LEVEL OF CARE SCREENING TOOL     IDENTIFICATION  Patient Name: [name] [name] Birthdate: 1951/11/11 Sex: female Admission Date (Current Location): 04/03/2020  [facility] and [place] Number:  Herbalist and Address:  [facility]. [facility], [place] [address] [place], [place] [address]      Provider Number: [id number]  Attending Physician Name and Address:  Lavina Hamman, MD  Relative Name and Phone Number:       Current Level of Care: Hospital Recommended Level of Care: [place] Prior Approval Number:    Date Approved/Denied:   PASRR Number: [id number]  Discharge Plan: SNF    Current Diagnoses: Patient Active Problem List   Diagnosis Date Noted  . AVM (arteriovenous malformation) of duodenum, acquired   . GAVE (gastric antral vascular ectasia)   . Polyp of hepatic flexure of colon   . Acute post-hemorrhagic anemia   . Dialysis patient ([place])   . Gastroesophageal reflux disease without esophagitis   . AVM (arteriovenous malformation) of colon   . Symptomatic anemia 02/21/2020  . AMS (altered mental status) 01/01/2020  . Recurrent falls 10/09/2019  . Generalized weakness 10/09/2019  . Unspecified atrial fibrillation ([place]) 10/08/2019  . Acute GI bleeding 10/05/2019  . Hyperkalemia   . Depression   . Esophageal stricture   . Diabetic gastroparesis ([place])   . Upper GI bleed 08/16/2019  . Elevated troponin 04/27/2019  . Hiatal hernia   . Iron deficiency anemia   . Microcytic anemia 02/05/2016  . Acute on chronic renal failure ([place])   . CAD in native artery   . Substance abuse ([place])   . Chronic combined systolic and diastolic CHF (congestive heart failure) ([place])   . ESRD (end stage renal disease) ([place])   . Obesity, Class III, BMI 40-49.9 (morbid obesity) ([place])   . Type 2 diabetes mellitus with chronic kidney disease on chronic dialysis, with long-term current use of insulin ([place]) 10/18/2014  . S/P CABG  (coronary artery bypass graft) 09/21/2013  . Arthritis of right shoulder region 03/23/2013  . Tobacco abuse   . Coronary artery disease   . NSTEMI (non-ST elevated myocardial infarction) ([place]) 06/29/2012  . History of cocaine abuse ([place]) 06/29/2012  . Essential hypertension 06/29/2012  . Glaucoma   . Hyperlipidemia   . History of CVA (cerebrovascular accident)     Orientation RESPIRATION BLADDER Height & Weight     Self, Time, Situation, Place  Normal Continent Weight: 261 lb (118.4 kg) Height:  5\' 8"  (172.7 cm)  BEHAVIORAL SYMPTOMS/MOOD NEUROLOGICAL BOWEL NUTRITION STATUS      Continent Diet(see discharge summary)  AMBULATORY STATUS COMMUNICATION OF NEEDS Skin   Extensive Assist Verbally Surgical wounds, Other (Comment)(closed incision on left great toe (dry gauze dressing); skin tear on buttocks)                       Personal Care Assistance Level of Assistance  Bathing, Feeding, Dressing Bathing Assistance: Maximum assistance Feeding assistance: Independent Dressing Assistance: Maximum assistance     Functional Limitations Info  Sight, Hearing, Speech Sight Info: Adequate Hearing Info: Adequate Speech Info: Adequate    SPECIAL CARE FACTORS FREQUENCY  OT (By licensed OT), PT (By licensed PT)     PT Frequency: 5x week OT Frequency: 5x week            Contractures Contractures Info: Not present    Additional Factors Info  Psychotropic, Allergies, Code Status,  Insulin Sliding Scale Code Status Info: Full Code Allergies Info: Naproxen Psychotropic Info: buPROPion (WELLBUTRIN SR) 12 hr tablet 150 mg 2x daily PO Insulin Sliding Scale Info: insulin aspart (novoLOG) injection 0-20 Units 3x daily with meals; insulin aspart (novoLOG) injection 2 Units 3x daily with meals       Current Medications (04/05/2020):  This is the current hospital active medication list Current Facility-Administered Medications  Medication Dose Route Frequency Provider Last Rate Last  Admin  . 0.9 %  sodium chloride infusion (Manually program via Guardrails IV Fluids)   Intravenous Once Lavina Hamman, MD      . acetaminophen (TYLENOL) tablet 650 mg  650 mg Oral Q6H PRN Pokhrel, [place], MD   650 mg at 04/04/20 1805  . albuterol (PROVENTIL) (2.5 MG/3ML) 0.083% nebulizer solution 2.5 mg  2.5 mg Inhalation Q6H PRN Norins, Heinz Knuckles, MD      . allopurinol (ZYLOPRIM) tablet 100 mg  100 mg Oral Daily Norins, Heinz Knuckles, MD   100 mg at 04/05/20 1010  . atorvastatin (LIPITOR) tablet 40 mg  40 mg Oral q1800 Neena Rhymes, MD   40 mg at 04/04/20 1805  . buPROPion [facility] SR) 12 hr tablet 150 mg  150 mg Oral BID Neena Rhymes, MD   150 mg at 04/05/20 1010  . Chlorhexidine Gluconate Cloth 2 % PADS 6 each  6 each Topical Daily Norins, Heinz Knuckles, MD   6 each at 04/05/20 1013  . Chlorhexidine Gluconate Cloth 2 % PADS 6 each  6 each Topical Q0600 Lynnda Child, PA-C      . ferrous sulfate tablet 325 mg  325 mg Oral BID WC Norins, Heinz Knuckles, MD   325 mg at 04/05/20 1010  . gabapentin (NEURONTIN) capsule 300 mg  300 mg Oral QHS Norins, Heinz Knuckles, MD   300 mg at 04/04/20 2033  . insulin aspart (novoLOG) injection 0-20 Units  0-20 Units Subcutaneous TID WC Norins, Heinz Knuckles, MD   3 Units at 04/05/20 1012  . insulin aspart (novoLOG) injection 2 Units  2 Units Subcutaneous TID WC Norins, Heinz Knuckles, MD   2 Units at 04/04/20 1241  . latanoprost (XALATAN) 0.005 % ophthalmic solution 1 drop  1 drop Both Eyes QHS Norins, Heinz Knuckles, MD   1 drop at 04/04/20 2034  . linaclotide (LINZESS) capsule 72 mcg  72 mcg Oral QAC breakfast Neena Rhymes, MD   72 mcg at 04/04/20 [phone]  . loratadine (CLARITIN) tablet 10 mg  10 mg Oral Daily Norins, Heinz Knuckles, MD   10 mg at 04/05/20 1010  . metoprolol tartrate (LOPRESSOR) tablet 25 mg  25 mg Oral BID Pokhrel, Laxman, MD   25 mg at 04/05/20 1011  . mometasone-formoterol (DULERA) 100-5 MCG/ACT inhaler 2 puff  2 puff Inhalation BID PRN Norins, Heinz Knuckles, MD      . nitroGLYCERIN (NITROSTAT) SL tablet 0.4 mg  0.4 mg Sublingual Q5 min PRN Norins, Heinz Knuckles, MD      . oxyCODONE (Oxy IR/ROXICODONE) immediate release tablet 5 mg  5 mg Oral Q6H PRN Norins, Heinz Knuckles, MD   5 mg at 04/05/20 0420  . pantoprazole (PROTONIX) EC tablet 40 mg  40 mg Oral Daily Norins, Heinz Knuckles, MD   40 mg at 04/05/20 1010  . polyethylene glycol (MIRALAX / GLYCOLAX) packet 17 g  17 g Oral Daily PRN Norins, Heinz Knuckles, MD      . sucroferric oxyhydroxide [facility]) chewable tablet 1,000 mg  1,000 mg Oral TID WC Norins, Heinz Knuckles, MD   1,000 mg at 04/05/20 1011     Discharge Medications: Please see discharge summary for a list of discharge medications.  Relevant Imaging Results:  Relevant Lab Results:   Additional Information SS#: [id number]; HD TTS at [facility], LCSW

## 2020-04-05 NOTE — Progress Notes (Signed)
Pt arrived to unit, alert and oriented x4. Pt denies pain at this time. Oriented pt to room and plan of care.

## 2020-04-05 NOTE — Progress Notes (Signed)
Nutrition Brief Note  Patient identified on the Malnutrition Screening Tool (MST) Report  Wt Readings from Last 15 Encounters:  04/03/20 118.4 kg  03/02/20 120.2 kg  02/28/20 119.8 kg  02/10/20 120 kg  01/20/20 121.6 kg  01/01/20 118 kg  12/12/19 122.8 kg  10/11/19 118.8 kg  08/26/19 (!) 271.4 kg  06/03/19 124 kg  05/27/19 128.4 kg  04/30/19 134.2 kg  08/12/18 123.4 kg  12/23/17 126.6 kg  09/23/16 135.2 kg   [name] [name] is a 69 y.o. female followed by Familypractice servicewith medical history significant of CAD s/p CABG, combined CHF, substance abuse, insulin dependent DM, h/oCVA. Patient has had recent prior admission in Feb for anemia. She was found by GI tohave esophageal erosions and colonic AVMs. Prior recommendation was to keep Hgb 8g or better 2/2 CAD. After her regular dialysis session today she was found to have Hgb 6.8 resulting in referral to [facility] for evaluation and transfusion. She endorses feeling more fatigued and sluggish than usual. Denies c/p, any active bleeding.  Pt admitted with anemia.   Reviewed I/O's: +960 ml x 24 hours and +1.6 L since admission  Pt is a resident of [facility].  Per GI notes, pt with FBOT positive stool. Plan for enteroscopy and colonoscopy tomorrow (04/06/20)  Lab Results  Component Value Date   HGBA1C 5.0 01/01/2020   PTA DM medications are 2 units inuslin lispro TID.   Labs reviewed: CBGS: [id number] (inpatient orders for glycemic control are 0-20 units insulin aspart TID with meals and 2 units insulin aspart TID with meals).   Current diet order is low sodium, patient is consuming approximately 50-100% of meals at this time. Labs and medications reviewed.   No nutrition interventions warranted at this time. If nutrition issues arise, please consult RD.   Loistine Chance, RD, LDN, [place] Registered Dietitian II Certified Diabetes Care and Education Specialist Please refer to [facility] for RD and/or RD on-call/weekend/after hours  pager

## 2020-04-05 NOTE — Consult Note (Signed)
[facility] Consult: 11:41 AM 04/05/2020  LOS: 1 day    Referring Provider: Dr Marlowe Sax  Primary Care Physician:  [facility] Gastroenterologist:  Dr. Carlean Purl     Reason for Consultation:  GIB, anemia   HPI: [name] [name] is a 69 y.o. female.  PMH ESRD on HD TTS.  IDDM.  Morbid obesity.  CVA.  CAD.  CABG 2013.  CHF, EF 45-50%.  Peripheral neuropathy. Cocaine abuse as recently as 07/2019.  Infrarenal abdominal aneurysm 3.1 cm on CT of 07/2019.  A. fib, not on anticoagulation due to history of GI bleed and frequent falls.  Recurrent issues with GI bleeding, blood loss anemia presumed due to colonic and SB AVM.  Has requuired transfusions on multiple occasions.   03/2016 EGD:For evaluation of IDA, FOBT positive. Medium sized hiatal hernia, no Cameron's lesions. Otherwise normal study. 03/2016 colonoscopy: solitary, nonbleeding colonic AVM at mid ascending colon, not treated. 07/2019 EGD for anemia, FOBT positive.  Mild esophagitis, esophageal stricture.  HH, no Cameron erosions.  Retained gastric contents c/w gastroparesis.  No source for blood loss isolated. 10/07/2019 SB enteroscopy: Nonbleeding [name] erosion within moderate hiatal hernia.  Study performed to the jejunum and otherwise normal.  Tattoo placed at distal extent of exam. 02/25/2020 Enteroscopy: nonbleeding AVMs in the gastric antrum and duodenum both obliterated with APC.  Exam to the jejunum was otherwise normal. 02/25/20 Colonoscopy: Small, bleeding AVM at hepatic flexure treated with Endo Clip.  3 mm, sessile polyp at hepatic flexure not treated due to active bleeding in same area.  Small, nonbleeding internal hemorrhoids  Followed by family practice..  Lab work at dialysis yesterday confirmed a Hb of 6.8 and she was  sent to the hospital for admission.    For a couple of weeks having recurrent burgundy colored stools but no symptomatic anemia such as shortness of breath, weakness, dizziness.  Appetite is good.  No nausea or vomiting.  Hgb 6.9 at arrival to [facility].       Past Medical History:  Diagnosis Date   Abscess    Acute blood loss anemia 08/17/2019   Acute pulmonary edema ([place])    Acute respiratory failure ([place]) 10/18/2014   Acute respiratory failure with hypoxia and hypercapnia (HCC) 06/01/2019   AMS (altered mental status) 01/01/2020   Anemia 08/2016   Angiodysplasia of colon    Arthritis of left shoulder region 03/23/2013   Bleeding gastrointestinal    Cardiomegaly 05/2019   Chest pain 04/17/2016   Chronic combined systolic and diastolic CHF (congestive heart failure) (HCC)    a. EF 40-45%, mild LVH, mid apicalanteroseptal and apical HK.   CKD (chronic kidney disease), stage III    Cocaine abuse ([place])    crack cocaine heavily until 2008 then sporadic use since then   Coronary artery disease    a. 06/2012 NSTEMI/CABG x 3 (LIMA->LAD, VG->OM2, VG->LCX);  b. 04/2015 MV: EF<30%, mid ant, apicalanterior, apical infarct;  c. 04/2015 Cath: LM nl, LAD 90p, LCX 34m, OM1 min irregs, RCA mild dzs, LIMA->LAD  nl w/ dist LAD dzs, VG->OM2 nl, VG->LCX nl-->Med Rx.   CVA (cerebral infarction)    a. right internal capsule stroke in 12/2006   Demand ischemia ([place])    Diabetes mellitus    diagnosed in 2008   Elevated troponin 04/27/2019   Essential hypertension    Glaucoma    Gout    Heme positive stool    HFrEF (heart failure with reduced ejection fraction) ([place])    Hyperlipidemia    Hyperparathyroidism, secondary renal ([place])    Hypertensive crisis 06/02/2019   Left-sided sensory deficit present    Lobar pneumonia ([place]) 04/27/2019   Obesity, morbid ([place])    Pneumonia    Pulmonary edema 05/2019   PVD (peripheral vascular disease) ([place])    a. 06/2012 ABI's: R - 0.73, L - 0.71.     Renal mass, right    Sepsis ([place]) 04/27/2019   Shortness of breath dyspnea    Stroke ([place])    Thrombocytosis ([place]) 04/17/2016   Thyroid nodule    FNA in 8502 showed follicular cells but not definate neoplasm   Tobacco abuse    Trichomoniasis     Past Surgical History:  Procedure Laterality Date   ABDOMINAL AORTOGRAM W/LOWER EXTREMITY Bilateral 02/06/2020   Procedure: ABDOMINAL AORTOGRAM W/LOWER EXTREMITY;  Surgeon: Waynetta Sandy, MD;  Location: [place] [facility];  Service: Cardiovascular;  Laterality: Bilateral;   AMPUTATION Left 02/07/2020   Procedure: AMPUTATION DIGIT - LEFT GREAT TOE;  Surgeon: Waynetta Sandy, MD;  Location: [place];  Service: Vascular;  Laterality: Left;   AV FISTULA PLACEMENT Left 08/25/2019   Procedure: ARTERIOVENOUS (AV) BRACHIOCEPHALIC FISTULA CREATION;  Surgeon: Waynetta Sandy, MD;  Location: [place];  Service: Vascular;  Laterality: Left;   CARDIAC CATHETERIZATION     CARDIAC CATHETERIZATION N/A 05/17/2015   Procedure: Left Heart Cath and Cors/Grafts Angiography;  Surgeon: [name], MD;  Location: [place] [facility];  Service: Cardiovascular;  Laterality: N/A;   COLONOSCOPY Left 02/25/2020   Procedure: COLONOSCOPY;  Surgeon: Lavena Bullion, DO;  Location: [place];  Service: Gastroenterology;  Laterality: Left;   COLONOSCOPY WITH PROPOFOL N/A 04/21/2016   Procedure: COLONOSCOPY WITH PROPOFOL;  Surgeon: Irene Shipper, MD;  Location: [place];  Service: Endoscopy;  Laterality: N/A;   CORONARY ARTERY BYPASS GRAFT  07/09/2012   Procedure: CORONARY ARTERY BYPASS GRAFTING (CABG);  Surgeon: Ivin Poot, MD;  Location: [place];  Service: Open Heart Surgery;  Laterality: N/A;   ENTEROSCOPY N/A 10/07/2019   Procedure: ENTEROSCOPY;  Surgeon: Jackquline Denmark, MD;  Location: [facility] ENDOSCOPY;  Service: Endoscopy;  Laterality: N/A;   ENTEROSCOPY Left 02/25/2020   Procedure: ENTEROSCOPY;  Surgeon: Lavena Bullion, DO;   Location: [place];  Service: Gastroenterology;  Laterality: Left;   ESOPHAGOGASTRODUODENOSCOPY N/A 04/20/2016   Procedure: ESOPHAGOGASTRODUODENOSCOPY (EGD);  Surgeon: Gatha Mayer, MD;  Location: [facility] ENDOSCOPY;  Service: Endoscopy;  Laterality: N/A;   ESOPHAGOGASTRODUODENOSCOPY (EGD) WITH PROPOFOL N/A 08/17/2019   Procedure: ESOPHAGOGASTRODUODENOSCOPY (EGD) WITH PROPOFOL;  Surgeon: Irene Shipper, MD;  Location: [facility] ENDOSCOPY;  Service: Endoscopy;  Laterality: N/A;   HEMOSTASIS CLIP PLACEMENT  02/25/2020   Procedure: HEMOSTASIS CLIP PLACEMENT;  Surgeon: Lavena Bullion, DO;  Location: [place];  Service: Gastroenterology;;   HOT HEMOSTASIS N/A 02/25/2020   Procedure: HOT HEMOSTASIS (ARGON PLASMA COAGULATION/BICAP);  Surgeon: Lavena Bullion, DO;  Location: [facility] ENDOSCOPY;  Service: Gastroenterology;  Laterality: N/A;   INSERTION OF DIALYSIS CATHETER Right 08/25/2019   Procedure: INSERTION  OF DIALYSIS CATHETER;  Surgeon: Waynetta Sandy, MD;  Location: [place];  Service: Vascular;  Laterality: Right;   IR FLUORO GUIDE CV LINE RIGHT  08/19/2019   IR US GUIDE VASC ACCESS RIGHT  08/19/2019   LEFT HEART CATHETERIZATION WITH CORONARY ANGIOGRAM N/A 06/29/2012   Procedure: LEFT HEART CATHETERIZATION WITH CORONARY ANGIOGRAM;  Surgeon: Peter M Martinique, MD;  Location: [facility] CATH LAB;  Service: Cardiovascular;  Laterality: N/A;   PERIPHERAL VASCULAR ATHERECTOMY Left 02/06/2020   Procedure: PERIPHERAL VASCULAR ATHERECTOMY;  Surgeon: Waynetta Sandy, MD;  Location: [place] [facility];  Service: Cardiovascular;  Laterality: Left;  SFA   PERIPHERAL VASCULAR INTERVENTION Left 02/06/2020   Procedure: PERIPHERAL VASCULAR INTERVENTION;  Surgeon: Waynetta Sandy, MD;  Location: [place] [facility];  Service: Cardiovascular;  Laterality: Left;  SFA   REVISON OF ARTERIOVENOUS FISTULA Left 02/07/2020   Procedure: REVISON OF ARTERIOVENOUS FISTULA;  Surgeon: Waynetta Sandy, MD;   Location: [place];  Service: Vascular;  Laterality: Left;   STERNAL WOUND DEBRIDEMENT  08/17/2012   Procedure: STERNAL WOUND DEBRIDEMENT;  Surgeon: Ivin Poot, MD;  Location: [place];  Service: Thoracic;  Laterality: N/A;  wound vac application   STERNAL WOUND DEBRIDEMENT  08/24/2012   Procedure: STERNAL WOUND DEBRIDEMENT;  Surgeon: Ivin Poot, MD;  Location: [place];  Service: Thoracic;  Laterality: N/A;   STERNAL WOUND DEBRIDEMENT  09/01/2012   Procedure: STERNAL WOUND DEBRIDEMENT;  Surgeon: Ivin Poot, MD;  Location: [place];  Service: Thoracic;  Laterality: N/A;   STERNAL WOUND DEBRIDEMENT  09/20/2012   Procedure: STERNAL WOUND DEBRIDEMENT;  Surgeon: Ivin Poot, MD;  Location: [place];  Service: Thoracic;  Laterality: N/A;  wound vac change   SUBMUCOSAL TATTOO INJECTION  10/07/2019   Procedure: SUBMUCOSAL TATTOO INJECTION;  Surgeon: Jackquline Denmark, MD;  Location: [facility] ENDOSCOPY;  Service: Endoscopy;;    Prior to Admission medications   Medication Sig Start Date End Date Taking? Authorizing Provider  albuterol (VENTOLIN HFA) 108 (90 Base) MCG/ACT inhaler Inhale 2 puffs into the lungs every 6 (six) hours as needed for wheezing or shortness of breath.   Yes [provider]  allopurinol (ZYLOPRIM) 100 MG tablet Take 1 tablet (100 mg total) by mouth daily. 02/28/20  Yes Gladys Damme, MD  aspirin 81 MG EC tablet Take 1 tablet (81 mg total) by mouth daily. 02/28/20  Yes Gladys Damme, MD  atorvastatin (LIPITOR) 40 MG tablet Take 1 tablet (40 mg total) by mouth daily at 6 PM. 02/28/20  Yes Gladys Damme, MD  buPROPion [facility] SR) 150 MG 12 hr tablet Take 1 tablet (150 mg total) by mouth 2 (two) times daily. 01/16/16  Yes Mikhail, Velta Addison, DO  cetirizine (ZYRTEC) 10 MG tablet Take 10 mg by mouth daily as needed for allergies.    Yes [provider]  ferrous sulfate 325 (65 FE) MG tablet Take 325 mg by mouth 2 (two) times daily with a meal.    Yes [provider]  gabapentin (NEURONTIN) 300 MG capsule Take 1 capsule (300 mg total) by mouth at bedtime. 02/10/20  Yes Aline August, MD  insulin lispro (HUMALOG) 100 UNIT/ML injection Inject 2 Units into the skin 3 (three) times daily before meals.   Yes [provider]  latanoprost (XALATAN) 0.005 % ophthalmic solution Place 1 drop into both eyes at bedtime.   Yes [provider]  LINZESS 72 MCG capsule Take 72 mcg by mouth daily before breakfast.  11/21/17  Yes [provider]  metoprolol tartrate (LOPRESSOR) 25 MG tablet Take 1 tablet (25 mg total) by mouth 2 (two) times daily. 02/10/20  Yes Aline August, MD  mometasone-formoterol (DULERA) 100-5 MCG/ACT AERO Inhale 2 puffs into the lungs every 4 (four) hours as needed for wheezing.   Yes [provider]  nitroGLYCERIN (NITROSTAT) 0.4 MG SL tablet Place 1 tablet (0.4 mg total) under the tongue every 5 (five) minutes as needed for chest pain. 04/30/19  Yes [name], MD  oxyCODONE (OXY IR/ROXICODONE) 5 MG immediate release tablet Take 1 tablet (5 mg total) by mouth every 6 (six) hours as needed for moderate pain. 02/10/20  Yes Rhyne, Samantha J, PA-C  pantoprazole (PROTONIX) 40 MG tablet Take 1 tablet (40 mg total) by mouth daily. 02/28/20 04/03/20 Yes Gladys Damme, MD  polyethylene glycol (MIRALAX) 17 g packet Take 17 g by mouth daily as needed for moderate constipation. 02/10/20  Yes Aline August, MD  TUMS 500 MG chewable tablet Chew 2 tablets by mouth at bedtime. 01/19/20  Yes [provider]  VELPHORO 500 MG chewable tablet Chew 2 tablets (1,000 mg total) by mouth 3 (three) times daily with meals. 02/10/20  Yes Aline August, MD  insulin aspart (NOVOLOG) 100 UNIT/ML injection Inject 2 Units into the skin 3 (three) times daily with meals. Patient not taking: Reported on 03/16/2020 02/10/20   Aline August, MD    Scheduled Meds:  sodium chloride   Intravenous Once   allopurinol  100 mg Oral Daily   atorvastatin   40 mg Oral q1800   buPROPion  150 mg Oral BID   Chlorhexidine Gluconate Cloth  6 each Topical Daily   Chlorhexidine Gluconate Cloth  6 each Topical Q0600   ferrous sulfate  325 mg Oral BID WC   gabapentin  300 mg Oral QHS   insulin aspart  0-20 Units Subcutaneous TID WC   insulin aspart  2 Units Subcutaneous TID WC   latanoprost  1 drop Both Eyes QHS   linaclotide  72 mcg Oral QAC breakfast   loratadine  10 mg Oral Daily   metoprolol tartrate  25 mg Oral BID   pantoprazole  40 mg Oral Daily   sucroferric oxyhydroxide  1,000 mg Oral TID WC   Infusions:  PRN Meds: acetaminophen, albuterol, mometasone-formoterol, nitroGLYCERIN, oxyCODONE, polyethylene glycol   Allergies as of 04/03/2020 - Review Complete 04/03/2020  Allergen Reaction Noted   Naproxen Rash 09/21/2013    Family History  Problem Relation Age of Onset   Diabetes Mother    Hypertension Mother    Cancer Mother    Hyperlipidemia Father    Hypertension Father    Kidney disease Father    Gout Father    Cerebrovascular Accident Father    Other Other        no known family CAD    Social History   Socioeconomic History   Marital status: Single    Spouse name: Not on file   Number of children: 0   Years of education: Not on file   Highest education level: Not on file  Occupational History   Not on file  Tobacco Use   Smoking status: Current Some Day Smoker    Packs/day: 0.25    Years: 50.00    Pack years: 12.50    Types: Cigarettes   Smokeless tobacco: Never Used  Substance and Sexual Activity   Alcohol use: No    Alcohol/week: 0.0 standard drinks   Drug use: Yes  Types: Cocaine    Comment: pt denies at current time   Sexual activity: Not Currently  Other Topics Concern   Not on file  Social History Narrative   Lives with boyfriend of 20 years.    Social Determinants of Health   Financial Resource Strain:    Difficulty of Paying Living Expenses:   Food  Insecurity:    Worried About Charity fundraiser in the Last Year:    Arboriculturist in the Last Year:   Transportation Needs:    Film/video editor (Medical):    Lack of Transportation (Non-Medical):   Physical Activity:    Days of Exercise per Week:    Minutes of Exercise per Session:   Stress:    Feeling of Stress :   Social Connections:    Frequency of Communication with Friends and Family:    Frequency of Social Gatherings with Friends and Family:    Attends Religious Services:    Active Member of Clubs or Organizations:    Attends Music therapist:    Marital Status:   Intimate Partner Violence:    Fear of Current or Ex-Partner:    Emotionally Abused:    Physically Abused:    Sexually Abused:     REVIEW OF SYSTEMS: Constitutional: See HPI ENT:  No nose bleeds Pulm: Denies shortness of breath or cough. CV:  No palpitations, no LE edema.  No angina GU:  No hematuria, no frequency GI: See HPI. Heme: Denies unusual or excessive bleeding/bruising. Transfusions: Multiple prior transfusions some of the details in the HPI. Neuro:  No headaches, no peripheral tingling or numbness Derm:  No itching, no rash or sores.  Endocrine:  No sweats or chills.  No polyuria or dysuria Immunization: Not queried. Travel:  None beyond local counties in last few months.    PHYSICAL EXAM: Vital signs in last 24 hours: Vitals:   04/05/20 0112 04/05/20 0352  BP: (!) 109/58 (!) 130/53  Pulse: 85 77  Resp: 16 17  Temp: 99.1 F (37.3 C) 98.9 F (37.2 C)  SpO2: 99% 97%   Wt Readings from Last 3 Encounters:  04/03/20 118.4 kg  03/02/20 120.2 kg  02/28/20 119.8 kg    General: Obese, alert.  Looks somewhat chronically but not acutely ill Head: No facial asymmetry or swelling.  No signs of head trauma. Eyes: No conjunctival pallor.  No scleral icterus.  EOMI. Ears: Not hard of hearing Nose: No congestion or discharge Mouth: Oral mucosa moist, pink,  clear.  Tongue midline.  No teeth. Neck: No JVD, no masses, no thyromegaly. Lungs: Clear bilaterally.  No labored breathing or cough. Heart: RRR.  No MRG.  S1, S2 present Abdomen: Obese, soft, active bowel sounds.  No tenderness.  No HSM, masses, bruits, hernias.   Rectal: Deferred rectal exam Musc/Skeltl: No joint redness, swelling or gross deformity Extremities: No CCE. Neurologic: Alert.  Oriented x3.  Appropriate.  Moves all 4 limbs, strength not tested.  No tremors or gross deficits. Skin: No rash or sores Nodes: No cervical adenopathy Psych: A little bit feisty but ultimately pleasant, cooperative.  Intake/Output from previous day: 04/07 0701 - 04/08 0700 In: 960 [P.O.:960] Out: -  Intake/Output this shift: No intake/output data recorded.  LAB RESULTS: Recent Labs    04/03/20 1630 04/04/20 0309 04/05/20 0817  WBC 8.9  --  8.8  HGB 6.8* 7.6* 6.9*  HCT 22.4* 25.0* 22.9*  PLT 269  --  275  BMET Lab Results  Component Value Date   NA 138 04/05/2020   NA 137 04/03/2020   NA 137 03/15/2020   K 4.3 04/05/2020   K 3.9 04/03/2020   K 4.2 03/15/2020   CL 94 (L) 04/05/2020   CL 94 (L) 04/03/2020   CL 96 (L) 03/15/2020   CO2 27 04/05/2020   CO2 28 04/03/2020   CO2 24 03/15/2020   GLUCOSE 128 (H) 04/05/2020   GLUCOSE 149 (H) 04/03/2020   GLUCOSE 148 (H) 03/15/2020   BUN 60 (H) 04/05/2020   BUN 36 (H) 04/03/2020   BUN 55 (H) 03/15/2020   CREATININE 8.04 (H) 04/05/2020   CREATININE 4.87 (H) 04/03/2020   CREATININE 5.46 (H) 03/15/2020   CALCIUM 8.5 (L) 04/05/2020   CALCIUM 8.1 (L) 04/03/2020   CALCIUM 7.6 (L) 03/15/2020   LFT Recent Labs    04/05/20 0817  ALBUMIN 2.7*   PT/INR Lab Results  Component Value Date   INR 1.2 04/03/2020   INR 1.2 08/25/2019   INR 1.2 04/28/2019   Hepatitis Panel No results for input(s): HEPBSAG, HCVAB, HEPAIGM, HEPBIGM in the last 72 hours. C-Diff No components found for: CDIFF Lipase     Component Value Date/Time    LIPASE 26 01/31/2020 1056    Drugs of Abuse     Component Value Date/Time   LABOPIA NONE DETECTED 08/16/2019 2159   COCAINSCRNUR POSITIVE (A) 08/16/2019 2159   LABBENZ NONE DETECTED 08/16/2019 2159   AMPHETMU NONE DETECTED 08/16/2019 2159   THCU NONE DETECTED 08/16/2019 2159   LABBARB NONE DETECTED 08/16/2019 2159     RADIOLOGY STUDIES: DG CHEST PORT 1 VIEW  Result Date: 04/04/2020 CLINICAL DATA:  69 year old female with fever. EXAM: PORTABLE CHEST 1 VIEW COMPARISON:  Chest radiograph dated 02/24/2020. FINDINGS: Right-sided dialysis catheter in similar position. There is cardiomegaly with mild vascular congestion. No focal consolidation, pleural effusion or pneumothorax. Median sternotomy wires and CABG vascular clips. No acute osseous pathology. IMPRESSION: Cardiomegaly with mild vascular congestion. No focal consolidation. Electronically Signed   By: Anner Crete M.D.   On: 04/04/2020 17:29      IMPRESSION:   *  Anemia.   [place] FOBT positive stool.   Hx AVMs in stomach, duodenum, colon and blood loss anemia.  1 U PRBCs ordered     *   ESRD.  We will go to dialysis this afternoon keeping on her Tuesday, Thursday, Saturday schedule    PLAN:     *   Enteroscopy and colonoscopy tmrw.  Time set is 11:30 AM.  Patient agreeable to proceed.  She does not like getting woken up to drink prep early in the morning so timing of split dose will be 5 PM tonight at 11 PM tonight.   Azucena Freed  04/05/2020, 11:41 AM Phone [phone]

## 2020-04-05 NOTE — TOC Initial Note (Signed)
Transition of Care [facility]) - Initial/Assessment Note    Patient Details  Name: [name] [name] MRN: [id number] Date of Birth: [birth year]  [facility]) CM/SW Contact:    Alexander Mt, LCSW Phone Number: 04/05/2020, 11:46 AM  Clinical Narrative:                 CSW spoke with pt via telephone. Introduced self, role, reason for call. Pt confirms she is from [facility] and plan would be for her to return when stable. She confirms she "will go back to [facility] and then go home!" Pt states no further questions, knows CSW will follow to support return to [facility] when ready. Pt states that her sister is who she would like for MD to give updates to as needed. [name] [place], [phone]).  TOC team continues to follow.   Expected Discharge Plan: [place] Barriers to Discharge: Continued Medical Work up   Patient Goals and CMS Choice Patient states their goals for this hospitalization and ongoing recovery are:: return to [facility] for more therapy then get home CMS Medicare.gov Compare Post Acute Care list provided to:: Patient(pt states she will return to [facility]) Choice offered to / list presented to : Patient  Expected Discharge Plan and Services Expected Discharge Plan: [place] In-house Referral: Clinical Social Work Discharge Planning Services: CM Consult Post Acute Care Choice: Resumption of Svcs/PTA Provider, [place] Living arrangements for the past 2 months: Single Family Home Expected Discharge Date: 04/04/20       Prior Living Arrangements/Services Living arrangements for the past 2 months: Single Family Home Lives with:: Self, Facility Resident Patient language and need for interpreter reviewed:: Yes(no needs)        Need for Family Participation in Patient Care: Yes (Comment)(assistance w/ daily cares) Care giver support system in place?: Yes (comment)(facility staff; siblings; sig other) Current home  services: DME Criminal Activity/Legal Involvement Pertinent to Current Situation/Hospitalization: No - Comment as needed  Activities of Daily Living Home Assistive Devices/Equipment: Environmental consultant (specify type), Cane (specify quad or straight), Eyeglasses ADL Screening (condition at time of admission) Patient's cognitive ability adequate to safely complete daily activities?: No Is the patient deaf or have difficulty hearing?: Yes Does the patient have difficulty seeing, even when wearing glasses/contacts?: No Does the patient have difficulty concentrating, remembering, or making decisions?: Yes(very sleepy in er) Patient able to express need for assistance with ADLs?: Yes Does the patient have difficulty dressing or bathing?: Yes Independently performs ADLs?: No Communication: Independent Dressing (OT): Independent Grooming: Independent Feeding: Independent Bathing: Independent Toileting: Needs assistance Is this a change from baseline?: Pre-admission baseline In/Out Bed: Needs assistance Is this a change from baseline?: Pre-admission baseline Walks in Home: Needs assistance Is this a change from baseline?: Pre-admission baseline Does the patient have difficulty walking or climbing stairs?: Yes Weakness of Legs: Both Weakness of Arms/Hands: Both  Permission Sought/Granted Permission sought to share information with : Family Supports, Chartered certified accountant granted to share information with : Yes, Verbal Permission Granted  Share Information with NAME: Aletta Edouard  Permission granted to share info w AGENCY: [place] granted to share info w Relationship: sister  Permission granted to share info w Contact Information: [phone]  Emotional Assessment Appearance:: Other (Comment Required(telephonic assessment) Attitude/Demeanor/Rapport: Engaged, Gracious, Other (comment)(telephonic assessment) Affect (typically observed): Accepting, Adaptable,  Appropriate, Other (comment)(telephonic assessment) Orientation: : Oriented to Self, Oriented to Place, Oriented to  Time, Oriented to  Situation Alcohol / Substance Use: Not Applicable Psych Involvement: (n/a)  Admission diagnosis:  Symptomatic anemia [D64.9] Patient Active Problem List   Diagnosis Date Noted  . AVM (arteriovenous malformation) of duodenum, acquired   . GAVE (gastric antral vascular ectasia)   . Polyp of hepatic flexure of colon   . Acute post-hemorrhagic anemia   . Dialysis patient ([place])   . Gastroesophageal reflux disease without esophagitis   . AVM (arteriovenous malformation) of colon   . Symptomatic anemia 02/21/2020  . AMS (altered mental status) 01/01/2020  . Recurrent falls 10/09/2019  . Generalized weakness 10/09/2019  . Unspecified atrial fibrillation ([place]) 10/08/2019  . Acute GI bleeding 10/05/2019  . Hyperkalemia   . Depression   . Esophageal stricture   . Diabetic gastroparesis ([place])   . Upper GI bleed 08/16/2019  . Elevated troponin 04/27/2019  . Hiatal hernia   . Iron deficiency anemia   . Microcytic anemia 02/05/2016  . Acute on chronic renal failure ([place])   . CAD in native artery   . Substance abuse ([place])   . Chronic combined systolic and diastolic CHF (congestive heart failure) ([place])   . ESRD (end stage renal disease) ([place])   . Obesity, Class III, BMI 40-49.9 (morbid obesity) ([place])   . Type 2 diabetes mellitus with chronic kidney disease on chronic dialysis, with long-term current use of insulin ([place]) 10/18/2014  . S/P CABG (coronary artery bypass graft) 09/21/2013  . Arthritis of right shoulder region 03/23/2013  . Tobacco abuse   . Coronary artery disease   . NSTEMI (non-ST elevated myocardial infarction) ([place]) 06/29/2012  . History of cocaine abuse ([place]) 06/29/2012  . Essential hypertension 06/29/2012  . Glaucoma   . Hyperlipidemia   . History of CVA (cerebrovascular accident)    PCP:  [facility] Pharmacy:   [facility] [address] [place] [address] Phone: [phone] Fax: [phone]  Readmission Risk Interventions Readmission Risk Prevention Plan 04/05/2020 12/12/2019 10/08/2019  Transportation Screening Complete Complete -  Medication Review Press photographer) Complete Complete -  PCP or Specialist appointment within 3-5 days of discharge Not Complete Complete -  PCP/Specialist Appt Not Complete comments facility resident - -  [place] or Home Care Consult Complete - -  SW Recovery Care/Counseling Consult Complete - -  Palliative Care Screening Not Applicable - Not Applicable  Skilled Nursing Facility Complete - -  Some recent data might be hidden

## 2020-04-05 NOTE — Consult Note (Signed)
[name] Renal Consultation Note    Indication for Consultation:  Management of ESRD/hemodialysis; anemia, hypertension/volume and secondary hyperparathyroidism  HPI: [name] [name] is a 69 y.o. female with ESRD on HD, DM2, CAD (Hx CABG), CHF, PVD, s/p L great toe amp, h/o CVA admitted symptomatic anemia/fever. S/p colonoscopy 01/2020 with small bleeding AVM.   Found to have Hgb 6.8 on outpatient labs and sent to ED for transfusion. Transfused 1 unit then spiked fever and hypotensive so admitted for further evaluation.  Labs: K+ 4.3 CO2 27 BUN 60 Cr 8.5  WBC 8.8 Hgb 6.9. CXR clear. Blood cultures neg to date.  COVID negative. GI has been consulted for recurrent GI bleeding.   [place] via [facility].  S/p L AVF revsion 3/5. Last dialyzed 4/6.   Seen and examined at bedside. Alert, appears comfortable. Denies CP, SOB, Abd pain, N/V.   Past Medical History:  Diagnosis Date  . Abscess   . Acute blood loss anemia 08/17/2019  . Acute pulmonary edema (HCC)   . Acute respiratory failure ([place]) 10/18/2014  . Acute respiratory failure with hypoxia and hypercapnia ([place]) 06/01/2019  . AMS (altered mental status) 01/01/2020  . Anemia 08/2016  . Angiodysplasia of colon   . Arthritis of left shoulder region 03/23/2013  . Bleeding gastrointestinal   . Cardiomegaly 05/2019  . Chest pain 04/17/2016  . Chronic combined systolic and diastolic CHF (congestive heart failure) (HCC)    a. EF 40-45%, mild LVH, mid apicalanteroseptal and apical HK.  . CKD (chronic kidney disease), stage III   . Cocaine abuse ([place])    crack cocaine heavily until 2008 then sporadic use since then  . Coronary artery disease    a. 06/2012 NSTEMI/CABG x 3 (LIMA->LAD, VG->OM2, VG->LCX);  b. 04/2015 MV: EF<30%, mid ant, apicalanterior, apical infarct;  c. 04/2015 Cath: LM nl, LAD 90p, LCX 75m OM1 min irregs, RCA mild dzs, LIMA->LAD nl w/ dist LAD dzs, VG->OM2 nl, VG->LCX nl-->Med Rx.  . CVA (cerebral  infarction)    a. right internal capsule stroke in 12/2006  . Demand ischemia ([place]   . Diabetes mellitus    diagnosed in 2008  . Elevated troponin 04/27/2019  . Essential hypertension   . Glaucoma   . Gout   . Heme positive stool   . HFrEF (heart failure with reduced ejection fraction) ([place]   . Hyperlipidemia   . Hyperparathyroidism, secondary renal ([place]   . Hypertensive crisis 06/02/2019  . Left-sided sensory deficit present   . Lobar pneumonia ([place] 04/27/2019  . Obesity, morbid ([place]   . Pneumonia   . Pulmonary edema 05/2019  . PVD (peripheral vascular disease) ([place]    a. 06/2012 ABI's: R - 0.73, L - 0.71.  .Marland KitchenRenal mass, right   . Sepsis ([place] 04/27/2019  . Shortness of breath dyspnea   . Stroke ([place]   . Thrombocytosis ([place] 04/17/2016  . Thyroid nodule    FNA in 21062showed follicular cells but not definate neoplasm  . Tobacco abuse   . Trichomoniasis    Past Surgical History:  Procedure Laterality Date  . ABDOMINAL AORTOGRAM W/LOWER EXTREMITY Bilateral 02/06/2020   Procedure: ABDOMINAL AORTOGRAM W/LOWER EXTREMITY;  Surgeon: CWaynetta Sandy MD;  [facility];  Service: Cardiovascular;  Laterality: Bilateral;  . AMPUTATION Left 02/07/2020   Procedure: AMPUTATION DIGIT - LEFT GREAT TOE;  Surgeon: CWaynetta Sandy MD;  Location: [place]  Service: Vascular;  Laterality: Left;  .  AV FISTULA PLACEMENT Left 08/25/2019   Procedure: ARTERIOVENOUS (AV) BRACHIOCEPHALIC FISTULA CREATION;  Surgeon: Waynetta Sandy, MD;  Location: [place];  Service: Vascular;  Laterality: Left;  . CARDIAC CATHETERIZATION    . CARDIAC CATHETERIZATION N/A 05/17/2015   Procedure: Left Heart Cath and Cors/Grafts Angiography;  Surgeon: [name], MD;  Location: [place] CV LAB;  Service: Cardiovascular;  Laterality: N/A;  . COLONOSCOPY Left 02/25/2020   Procedure: COLONOSCOPY;  Surgeon: Lavena Bullion, DO;  Location: [place];  Service: Gastroenterology;   Laterality: Left;  . COLONOSCOPY WITH PROPOFOL N/A 04/21/2016   Procedure: COLONOSCOPY WITH PROPOFOL;  Surgeon: Irene Shipper, MD;  Location: [place];  Service: Endoscopy;  Laterality: N/A;  . CORONARY ARTERY BYPASS GRAFT  07/09/2012   Procedure: CORONARY ARTERY BYPASS GRAFTING (CABG);  Surgeon: Ivin Poot, MD;  Location: [place];  Service: Open Heart Surgery;  Laterality: N/A;  . ENTEROSCOPY N/A 10/07/2019   Procedure: ENTEROSCOPY;  Surgeon: Jackquline Denmark, MD;  Location: [facility] ENDOSCOPY;  Service: Endoscopy;  Laterality: N/A;  . ENTEROSCOPY Left 02/25/2020   Procedure: ENTEROSCOPY;  Surgeon: Lavena Bullion, DO;  Location: [place];  Service: Gastroenterology;  Laterality: Left;  . ESOPHAGOGASTRODUODENOSCOPY N/A 04/20/2016   Procedure: ESOPHAGOGASTRODUODENOSCOPY (EGD);  Surgeon: Gatha Mayer, MD;  Location: [facility] ENDOSCOPY;  Service: Endoscopy;  Laterality: N/A;  . ESOPHAGOGASTRODUODENOSCOPY (EGD) WITH PROPOFOL N/A 08/17/2019   Procedure: ESOPHAGOGASTRODUODENOSCOPY (EGD) WITH PROPOFOL;  Surgeon: Irene Shipper, MD;  Location: [facility] ENDOSCOPY;  Service: Endoscopy;  Laterality: N/A;  . HEMOSTASIS CLIP PLACEMENT  02/25/2020   Procedure: HEMOSTASIS CLIP PLACEMENT;  Surgeon: Lavena Bullion, DO;  Location: [place];  Service: Gastroenterology;;  . HOT HEMOSTASIS N/A 02/25/2020   Procedure: HOT HEMOSTASIS (ARGON PLASMA COAGULATION/BICAP);  Surgeon: Lavena Bullion, DO;  Location: [facility] ENDOSCOPY;  Service: Gastroenterology;  Laterality: N/A;  . INSERTION OF DIALYSIS CATHETER Right 08/25/2019   Procedure: INSERTION OF DIALYSIS CATHETER;  Surgeon: Waynetta Sandy, MD;  Location: [place];  Service: Vascular;  Laterality: Right;  . IR FLUORO GUIDE CV LINE RIGHT  08/19/2019  . IR US GUIDE VASC ACCESS RIGHT  08/19/2019  . LEFT HEART CATHETERIZATION WITH CORONARY ANGIOGRAM N/A 06/29/2012   Procedure: LEFT HEART CATHETERIZATION WITH CORONARY ANGIOGRAM;  Surgeon: Peter M Martinique, MD;  Location: [facility]  CATH LAB;  Service: Cardiovascular;  Laterality: N/A;  . PERIPHERAL VASCULAR ATHERECTOMY Left 02/06/2020   Procedure: PERIPHERAL VASCULAR ATHERECTOMY;  Surgeon: Waynetta Sandy, MD;  Location: [place] CV LAB;  Service: Cardiovascular;  Laterality: Left;  SFA  . PERIPHERAL VASCULAR INTERVENTION Left 02/06/2020   Procedure: PERIPHERAL VASCULAR INTERVENTION;  Surgeon: Waynetta Sandy, MD;  Location: [place] CV LAB;  Service: Cardiovascular;  Laterality: Left;  SFA  . REVISON OF ARTERIOVENOUS FISTULA Left 02/07/2020   Procedure: REVISON OF ARTERIOVENOUS FISTULA;  Surgeon: Waynetta Sandy, MD;  Location: [place];  Service: Vascular;  Laterality: Left;  . STERNAL WOUND DEBRIDEMENT  08/17/2012   Procedure: STERNAL WOUND DEBRIDEMENT;  Surgeon: Ivin Poot, MD;  Location: [place];  Service: Thoracic;  Laterality: N/A;  wound vac application  . STERNAL WOUND DEBRIDEMENT  08/24/2012   Procedure: STERNAL WOUND DEBRIDEMENT;  Surgeon: Ivin Poot, MD;  Location: [place];  Service: Thoracic;  Laterality: N/A;  . STERNAL WOUND DEBRIDEMENT  09/01/2012   Procedure: STERNAL WOUND DEBRIDEMENT;  Surgeon: Ivin Poot, MD;  Location: [place];  Service: Thoracic;  Laterality: N/A;  . [place]  09/20/2012   Procedure: STERNAL WOUND DEBRIDEMENT;  Surgeon: Ivin Poot, MD;  Location[facility];  Service: Thoracic;  Laterality: N/A;  wound vac change  . SUBMUCOSAL TATTOO INJECTION  10/07/2019   Procedure: SUBMUCOSAL TATTOO INJECTION;  Surgeon: Jackquline Denmark, MD;  Location: [facility] ENDOSCOPY;  Service: Endoscopy;;   Family History  Problem Relation Age of Onset  . Diabetes Mother   . Hypertension Mother   . Cancer Mother   . Hyperlipidemia Father   . Hypertension Father   . Kidney disease Father   . Gout Father   . Cerebrovascular Accident Father   . Other Other        no known family CAD   Social History:  reports that she has been smoking cigarettes. She has a 12.50  pack-year smoking history. She has never used smokeless tobacco. She reports current drug use. Drug: Cocaine. She reports that she does not drink alcohol. Allergies  Allergen Reactions  . Naproxen Rash   Prior to Admission medications   Medication Sig Start Date End Date Taking? Authorizing Provider  albuterol (VENTOLIN HFA) 108 (90 Base) MCG/ACT inhaler Inhale 2 puffs into the lungs every 6 (six) hours as needed for wheezing or shortness of breath.   Yes [provider]  allopurinol (ZYLOPRIM) 100 MG tablet Take 1 tablet (100 mg total) by mouth daily. 02/28/20  Yes Gladys Damme, MD  aspirin 81 MG EC tablet Take 1 tablet (81 mg total) by mouth daily. 02/28/20  Yes Gladys Damme, MD  atorvastatin (LIPITOR) 40 MG tablet Take 1 tablet (40 mg total) by mouth daily at 6 PM. 02/28/20  Yes Gladys Damme, MD  buPROPion [facility] SR) 150 MG 12 hr tablet Take 1 tablet (150 mg total) by mouth 2 (two) times daily. 01/16/16  Yes Mikhail, Velta Addison, DO  cetirizine (ZYRTEC) 10 MG tablet Take 10 mg by mouth daily as needed for allergies.    Yes [provider]  ferrous sulfate 325 (65 FE) MG tablet Take 325 mg by mouth 2 (two) times daily with a meal.    Yes [provider]  gabapentin (NEURONTIN) 300 MG capsule Take 1 capsule (300 mg total) by mouth at bedtime. 02/10/20  Yes Aline August, MD  insulin lispro (HUMALOG) 100 UNIT/ML injection Inject 2 Units into the skin 3 (three) times daily before meals.   Yes [provider]  latanoprost (XALATAN) 0.005 % ophthalmic solution Place 1 drop into both eyes at bedtime.   Yes [provider]  LINZESS 72 MCG capsule Take 72 mcg by mouth daily before breakfast.  11/21/17  Yes [provider]  metoprolol tartrate (LOPRESSOR) 25 MG tablet Take 1 tablet (25 mg total) by mouth 2 (two) times daily. 02/10/20  Yes Aline August, MD  mometasone-formoterol (DULERA) 100-5 MCG/ACT AERO Inhale 2 puffs into the lungs every 4  (four) hours as needed for wheezing.   Yes [provider]  nitroGLYCERIN (NITROSTAT) 0.4 MG SL tablet Place 1 tablet (0.4 mg total) under the tongue every 5 (five) minutes as needed for chest pain. 04/30/19  Yes [name], MD  oxyCODONE (OXY IR/ROXICODONE) 5 MG immediate release tablet Take 1 tablet (5 mg total) by mouth every 6 (six) hours as needed for moderate pain. 02/10/20  Yes Rhyne, Samantha J, PA-C  pantoprazole (PROTONIX) 40 MG tablet Take 1 tablet (40 mg total) by mouth daily. 02/28/20 04/03/20 Yes Gladys Damme, MD  polyethylene glycol (MIRALAX) 17 g packet Take 17 g by mouth daily as  needed for moderate constipation. 02/10/20  Yes Aline August, MD  TUMS 500 MG chewable tablet Chew 2 tablets by mouth at bedtime. 01/19/20  Yes [provider]  VELPHORO 500 MG chewable tablet Chew 2 tablets (1,000 mg total) by mouth 3 (three) times daily with meals. 02/10/20  Yes Aline August, MD  insulin aspart (NOVOLOG) 100 UNIT/ML injection Inject 2 Units into the skin 3 (three) times daily with meals. Patient not taking: Reported on 03/16/2020 02/10/20   Aline August, MD   Current Facility-Administered Medications  Medication Dose Route Frequency Provider Last Rate Last Admin  . 0.9 %  sodium chloride infusion (Manually program via Guardrails IV Fluids)   Intravenous Once Lavina Hamman, MD      . acetaminophen (TYLENOL) tablet 650 mg  650 mg Oral Q6H PRN Pokhrel, Laxman, MD   650 mg at 04/04/20 1805  . albuterol (PROVENTIL) (2.5 MG/3ML) 0.083% nebulizer solution 2.5 mg  2.5 mg Inhalation Q6H PRN Norins, Heinz Knuckles, MD      . allopurinol (ZYLOPRIM) tablet 100 mg  100 mg Oral Daily Norins, Heinz Knuckles, MD   100 mg at 04/05/20 1010  . atorvastatin (LIPITOR) tablet 40 mg  40 mg Oral q1800 Neena Rhymes, MD   40 mg at 04/04/20 1805  . buPROPion [facility] SR) 12 hr tablet 150 mg  150 mg Oral BID Neena Rhymes, MD   150 mg at 04/05/20 1010  . Chlorhexidine Gluconate Cloth 2 % PADS 6  each  6 each Topical Daily Norins, Heinz Knuckles, MD   6 each at 04/05/20 1013  . Chlorhexidine Gluconate Cloth 2 % PADS 6 each  6 each Topical Q0600 Lynnda Child, PA-C   6 each at 04/05/20 1227  . ferrous sulfate tablet 325 mg  325 mg Oral BID WC Norins, Heinz Knuckles, MD   325 mg at 04/05/20 1010  . gabapentin (NEURONTIN) capsule 300 mg  300 mg Oral QHS Norins, Heinz Knuckles, MD   300 mg at 04/04/20 2033  . insulin aspart (novoLOG) injection 0-20 Units  0-20 Units Subcutaneous TID WC Norins, Heinz Knuckles, MD   7 Units at 04/05/20 1227  . insulin aspart (novoLOG) injection 2 Units  2 Units Subcutaneous TID WC Norins, Heinz Knuckles, MD   2 Units at 04/05/20 1226  . latanoprost (XALATAN) 0.005 % ophthalmic solution 1 drop  1 drop Both Eyes QHS Norins, Heinz Knuckles, MD   1 drop at 04/04/20 2034  . linaclotide (LINZESS) capsule 72 mcg  72 mcg Oral QAC breakfast Neena Rhymes, MD   72 mcg at 04/05/20 1223  . loratadine (CLARITIN) tablet 10 mg  10 mg Oral Daily Norins, Heinz Knuckles, MD   10 mg at 04/05/20 1010  . metoCLOPramide (REGLAN) injection 5 mg  5 mg Intravenous Once Vena Rua, PA-C       Followed by  . metoCLOPramide (REGLAN) injection 5 mg  5 mg Intravenous Once Vena Rua, PA-C      . metoprolol tartrate (LOPRESSOR) tablet 25 mg  25 mg Oral BID Pokhrel, Laxman, MD   25 mg at 04/05/20 1011  . mometasone-formoterol (DULERA) 100-5 MCG/ACT inhaler 2 puff  2 puff Inhalation BID PRN Norins, Heinz Knuckles, MD      . nitroGLYCERIN (NITROSTAT) SL tablet 0.4 mg  0.4 mg Sublingual Q5 min PRN Norins, Heinz Knuckles, MD      . oxyCODONE (Oxy IR/ROXICODONE) immediate release tablet 5 mg  5 mg Oral Q6H PRN  Norins, Heinz Knuckles, MD   5 mg at 04/05/20 0420  . pantoprazole (PROTONIX) EC tablet 40 mg  40 mg Oral Daily Norins, Heinz Knuckles, MD   40 mg at 04/05/20 1010  . peg 3350 powder (MOVIPREP) kit 100 g  0.5 kit Oral Once Vena Rua, PA-C       And  . peg 3350 powder (MOVIPREP) kit 100 g  0.5 kit Oral Once Vena Rua, PA-C      . polyethylene glycol (MIRALAX / GLYCOLAX) packet 17 g  17 g Oral Daily PRN Norins, Heinz Knuckles, MD      . sucroferric oxyhydroxide [facility]) chewable tablet 1,000 mg  1,000 mg Oral TID WC Norins, Heinz Knuckles, MD   1,000 mg at 04/05/20 1011     ROS: As per HPI otherwise negative.  Physical Exam: Vitals:   04/04/20 2004 04/04/20 2236 04/05/20 0112 04/05/20 0352  BP: 103/69 105/62 (!) 109/58 (!) 130/53  Pulse: 98 (!) 54 85 77  Resp: '16 16 16 17  ' Temp: 98.5 F (36.9 C) 99.5 F (37.5 C) 99.1 F (37.3 C) 98.9 F (37.2 C)  TempSrc:  Oral Oral Oral  SpO2: 99% 97% 99% 97%  Weight:      Height:         General: Obese female, nad  Head: NCAT sclera not icteric,  Neck: Supple. No JVD  Lungs: Clear bilaterally  Heart: RRR with S1 S2 Abdomen: soft non-tender  Lower extremities: No LE edema  Neuro: A & O  X 3. Moves all extremities spontaneously. Psych:  Responds to questions appropriately with a normal affect. Dialysis Access: R IJ TDC/ LUE AVF +bruit   Labs: Basic Metabolic Panel: Recent Labs  Lab 04/03/20 1630 04/05/20 0817  NA 137 138  K 3.9 4.3  CL 94* 94*  CO2 28 27  GLUCOSE 149* 128*  BUN 36* 60*  CREATININE 4.87* 8.04*  CALCIUM 8.1* 8.5*  PHOS  --  7.1*   Liver Function Tests: Recent Labs  Lab 04/05/20 0817  ALBUMIN 2.7*   No results for input(s): LIPASE, AMYLASE in the last 168 hours. No results for input(s): AMMONIA in the last 168 hours. CBC: Recent Labs  Lab 04/03/20 1630 04/04/20 0309 04/05/20 0817  WBC 8.9  --  8.8  NEUTROABS 7.3  --   --   HGB 6.8* 7.6* 6.9*  HCT 22.4* 25.0* 22.9*  MCV 91.4  --  93.1  PLT 269  --  275   Cardiac Enzymes: No results for input(s): CKTOTAL, CKMB, CKMBINDEX, TROPONINI in the last 168 hours. CBG: Recent Labs  Lab 04/04/20 1647 04/04/20 2103 04/04/20 2237 04/05/20 0902 04/05/20 1222  GLUCAP 89 172* 136* 121* 209*   Iron Studies: No results for input(s): IRON, TIBC, TRANSFERRIN, FERRITIN in  the last 72 hours. Studies/Results: DG CHEST PORT 1 VIEW  Result Date: 04/04/2020 CLINICAL DATA:  69 year old female with fever. EXAM: PORTABLE CHEST 1 VIEW COMPARISON:  Chest radiograph dated 02/24/2020. FINDINGS: Right-sided dialysis catheter in similar position. There is cardiomegaly with mild vascular congestion. No focal consolidation, pleural effusion or pneumothorax. Median sternotomy wires and CABG vascular clips. No acute osseous pathology. IMPRESSION: Cardiomegaly with mild vascular congestion. No focal consolidation. Electronically Signed   By: Anner Crete M.D.   On: 04/04/2020 17:29    Dialysis Orders:  [facility] TTS 4h25mn 400/800 EDW 118kg 2K/2.5Ca TDC No heparin Venofer 100 q HD until 4/17 Mircera 225 (last 4/1) Sensipar 30 Calcitriol 2.25  Assessment/Plan: 1. Recurrent GI bleeding. Hx AVMs. GI following.  2. Fever on admission - Covid negative. CXR clear. Blood cultures neg to date. Per primary  3. ESRD -  HD TTS. Has been using TDC.  s/p LUE AVF revision/transposition 2/9. Per [facility] note fistula ok to use 2 weeks from 3/5. If remains inpatient use AVF next HD.  4. Hypertension/volume  - BP low/stable. UF to EDW as tolerated  5. ABLA/Anemia CKD - Hgb 6.9. s/p 1 unit. For another unit today. Recent ESA dose as outpatient. Continue Fe load here  if blood cultures remain neg.  6. Metabolic bone disease -  Continue binders/Sensipar/Calcitriol for now 7. Nutrition - Renal diet/prot supp   Lynnda Child PA-C [facility] Pager [phone] 04/05/2020, 2:28 PM

## 2020-04-05 NOTE — Progress Notes (Signed)
[facility] Progress Note  Patient: [name] [name]    EXM:[id number]  DOA: 04/03/2020     Date of Service: the patient was seen and examined on 04/05/2020  Chief Complaint  Patient presents with  . Abnormal Lab   Brief hospital course: Past medical history of ESRD on HD, CAD SP CABG, combined CHF, type II DM, CVA.  Recent anemia.  Patient presented with complaints of abnormal hemoglobin from hemodialysis.  She was transfused PRBC.  After that she started having Sirs-like reaction.  Patient was transferred to [facility] for hemodialysis need and continuous observation for her sepsis-like picture. Patient continues to have low hemoglobin and therefore GI was consulted and is now scheduled for colonoscopy tomorrow on 04/06/2020. Currently further plan is monitor results for colonoscopy.  Assessment and Plan: 1.  Lower GI bleed Patient reports hematochezia. Hemoglobin dropping down after 1 PRBC transfusion to less than 7. On repeat another PRBC transfusion today on 04/05/2020. Monitor H&H. Patient will be prepped for colonoscopy on 04/06/2020. Monitor results. Prior colonoscopy last February was suggestive of an AVM treated with clips.  2.  ESRD on HD Admitted to [facility] for ESRD. Continue HD appreciate dermatology input.  3.  History of combined chronic CHF Currently euvolemic.  Monitor.  4.  Type 2 diabetes mellitus with renal complication.  Uncontrolled with hyperglycemia Currently on sliding scale insulin.  5.  Essential hypertension. Blood pressure stable.  Monitor.   Body mass index is 39.68 kg/m.   Diet: Clear liquid diet DVT Prophylaxis: SCD, pharmacological prophylaxis contraindicated due to Due to GI bleed   Advance goals of care discussion: Full code  Family Communication: no family was present at bedside, at the time of interview.   Disposition:  Pt is from home, admitted with GI bleed, still has anemia requiring transfusion, which precludes a  safe discharge. Discharge to home, when medically stable.  Subjective: No bowel movement so far but reports hematochezia at home.  No nausea no vomiting.  No abdominal pain.  Physical Exam: General:  alert oriented to time, place, and person.  Appear in mild distress, affect appropriate Eyes: PERRL ENT: Oral Mucosa Clear, moist  Neck: no JVD,  Cardiovascular: S1 and S2 Present, no Murmur,  Respiratory: good respiratory effort, Bilateral Air entry equal and Decreased, no Crackles, no wheezes Abdomen: Bowel Sound present, Soft and no tenderness,  Skin: no rash Extremities: no Pedal edema, no calf tenderness Neurologic: without any new focal findings  Gait not checked due to patient safety concerns  Vitals:   04/05/20 1430 04/05/20 1445 04/05/20 1721 04/05/20 1814  BP: (!) 116/53 108/77 136/73 128/78  Pulse: 70 70 73 74  Resp: '20 18 18 20  ' Temp: 98.2 F (36.8 C) 98.1 F (36.7 C) 98.1 F (36.7 C) 98.6 F (37 C)  TempSrc: Oral Oral Oral Oral  SpO2: 100% 100% 100% 100%  Weight:      Height:        Intake/Output Summary (Last 24 hours) at 04/05/2020 1859 Last data filed at 04/05/2020 1430 Gross per 24 hour  Intake 460 ml  Output --  Net 460 ml   Filed Weights   04/03/20 1548  Weight: 118.4 kg    Data Reviewed: I have personally reviewed and interpreted daily labs, tele strips, imagings as discussed above. I reviewed all nursing notes, pharmacy notes, vitals, pertinent old records I have discussed plan of care as described above with RN and patient/family.  CBC: Recent Labs  Lab 04/03/20 1630 04/04/20 0309 04/05/20 0817  WBC 8.9  --  8.8  NEUTROABS 7.3  --   --   HGB 6.8* 7.6* 6.9*  HCT 22.4* 25.0* 22.9*  MCV 91.4  --  93.1  PLT 269  --  202   Basic Metabolic Panel: Recent Labs  Lab 04/03/20 1630 04/05/20 0817  NA 137 138  K 3.9 4.3  CL 94* 94*  CO2 28 27  GLUCOSE 149* 128*  BUN 36* 60*  CREATININE 4.87* 8.04*  CALCIUM 8.1* 8.5*  PHOS  --  7.1*     Studies: No results found.  Scheduled Meds: . allopurinol  100 mg Oral Daily  . atorvastatin  40 mg Oral q1800  . buPROPion  150 mg Oral BID  . Chlorhexidine Gluconate Cloth  6 each Topical Daily  . Chlorhexidine Gluconate Cloth  6 each Topical Q0600  . [START ON 04/06/2020] ferrous sulfate  325 mg Oral BID WC  . gabapentin  300 mg Oral QHS  . insulin aspart  0-20 Units Subcutaneous TID WC  . insulin aspart  2 Units Subcutaneous TID WC  . latanoprost  1 drop Both Eyes QHS  . linaclotide  72 mcg Oral QAC breakfast  . loratadine  10 mg Oral Daily  . metoCLOPramide (REGLAN) injection  5 mg Intravenous Once  . metoprolol tartrate  25 mg Oral BID  . pantoprazole  40 mg Oral Daily  . peg 3350 powder  0.5 kit Oral Once  . [START ON 04/06/2020] sucroferric oxyhydroxide  1,000 mg Oral TID WC   Continuous Infusions: PRN Meds: acetaminophen, albuterol, mometasone-formoterol, nitroGLYCERIN, oxyCODONE, polyethylene glycol  Time spent: 35 minutes  Author: Berle Mull, MD [facility] 04/05/2020 6:59 PM  To reach On-call, see care teams to locate the attending and reach out to them via [email or web address]. If 7PM-7AM, please contact night-coverage If you still have difficulty reaching the attending provider, please page the [facility] (Director on Call) for [facility] on amion for assistance.

## 2020-04-06 ENCOUNTER — Encounter (HOSPITAL_COMMUNITY)
Admission: EM | Disposition: A | Discharge status: Skilled Nursing Facility | Payer: Self-pay | Source: Skilled Nursing Facility | Attending: Internal Medicine

## 2020-04-06 DIAGNOSIS — K922 Gastrointestinal hemorrhage, unspecified: Secondary | ICD-10-CM | POA: Diagnosis not present

## 2020-04-06 DIAGNOSIS — D649 Anemia, unspecified: Secondary | ICD-10-CM | POA: Diagnosis not present

## 2020-04-06 DIAGNOSIS — Z992 Dependence on renal dialysis: Secondary | ICD-10-CM | POA: Diagnosis not present

## 2020-04-06 DIAGNOSIS — N186 End stage renal disease: Secondary | ICD-10-CM | POA: Diagnosis not present

## 2020-04-06 LAB — RENAL FUNCTION PANEL
Albumin: 2.7 g/dL — ABNORMAL LOW (ref 3.5–5.0)
Anion gap: 19 — ABNORMAL HIGH (ref 5–15)
BUN: 69 mg/dL — ABNORMAL HIGH (ref 8–23)
CO2: 25 mmol/L (ref 22–32)
Calcium: 8.3 mg/dL — ABNORMAL LOW (ref 8.9–10.3)
Chloride: 90 mmol/L — ABNORMAL LOW (ref 98–111)
Creatinine, Ser: 9.14 mg/dL — ABNORMAL HIGH (ref 0.44–1.00)
GFR calc Af Amer: 5 mL/min — ABNORMAL LOW (ref >60)
GFR calc non Af Amer: 4 mL/min — ABNORMAL LOW (ref >60)
Glucose, Bld: 66 mg/dL — ABNORMAL LOW (ref 70–99)
Phosphorus: 7.1 mg/dL — ABNORMAL HIGH (ref 2.5–4.6)
Potassium: 4.5 mmol/L (ref 3.5–5.1)
Sodium: 134 mmol/L — ABNORMAL LOW (ref 135–145)

## 2020-04-06 LAB — CBC
HCT: 24.1 % — ABNORMAL LOW (ref 36.0–46.0)
HCT: 27.3 % — ABNORMAL LOW (ref 36.0–46.0)
Hemoglobin: 7.5 g/dL — ABNORMAL LOW (ref 12.0–15.0)
Hemoglobin: 8.6 g/dL — ABNORMAL LOW (ref 12.0–15.0)
MCH: 28.2 pg (ref 26.0–34.0)
MCH: 28.3 pg (ref 26.0–34.0)
MCHC: 31.1 g/dL (ref 30.0–36.0)
MCHC: 31.5 g/dL (ref 30.0–36.0)
MCV: 90.6 fL (ref 80.0–100.0)
MCV: 89.8 fL (ref 80.0–100.0)
Platelets: 289 10*3/uL (ref 150–400)
Platelets: 261 10*3/uL (ref 150–400)
RBC: 2.66 MIL/uL — ABNORMAL LOW (ref 3.87–5.11)
RBC: 3.04 MIL/uL — ABNORMAL LOW (ref 3.87–5.11)
RDW: 18.7 % — ABNORMAL HIGH (ref 11.5–15.5)
RDW: 18.5 % — ABNORMAL HIGH (ref 11.5–15.5)
WBC: 9.6 10*3/uL (ref 4.0–10.5)
WBC: 10.8 10*3/uL — ABNORMAL HIGH (ref 4.0–10.5)
nRBC: 0.4 % — ABNORMAL HIGH (ref 0.0–0.2)
nRBC: 0 % (ref 0.0–0.2)

## 2020-04-06 LAB — GLUCOSE, CAPILLARY
Glucose-Capillary: 103 mg/dL — ABNORMAL HIGH (ref 70–99)
Glucose-Capillary: 119 mg/dL — ABNORMAL HIGH (ref 70–99)
Glucose-Capillary: 167 mg/dL — ABNORMAL HIGH (ref 70–99)
Glucose-Capillary: 64 mg/dL — ABNORMAL LOW (ref 70–99)
Glucose-Capillary: 99 mg/dL (ref 70–99)

## 2020-04-06 LAB — PREPARE RBC (CROSSMATCH)

## 2020-04-06 SURGERY — COLONOSCOPY WITH PROPOFOL
Anesthesia: Monitor Anesthesia Care

## 2020-04-06 MED ORDER — INSULIN ASPART 100 UNIT/ML ~~LOC~~ SOLN
0.0000 [IU] | Freq: Every day | SUBCUTANEOUS | Status: DC
  Administered 2020-04-13: 23:00:00 2 [IU] via SUBCUTANEOUS

## 2020-04-06 MED ORDER — SODIUM CHLORIDE 0.9% IV SOLUTION: Freq: Once | INTRAVENOUS | Status: DC

## 2020-04-06 MED ORDER — PEG-KCL-NACL-NASULF-NA ASC-C 100 G PO SOLR
1.0000 | Freq: Once | ORAL | Status: AC
  Administered 2020-04-06: 200 g via ORAL
  Filled 2020-04-06 (×2): qty 1

## 2020-04-06 MED ORDER — DEXTROSE 50 % IV SOLN
12.5000 g | INTRAVENOUS | Status: AC
  Administered 2020-04-06: 12.5 g via INTRAVENOUS

## 2020-04-06 MED ORDER — DEXTROSE 50 % IV SOLN
INTRAVENOUS | Status: AC
  Filled 2020-04-06: qty 50

## 2020-04-06 MED ORDER — INSULIN ASPART 100 UNIT/ML ~~LOC~~ SOLN
0.0000 [IU] | Freq: Three times a day (TID) | SUBCUTANEOUS | Status: DC
  Administered 2020-04-07: 13:00:00 1 [IU] via SUBCUTANEOUS
  Administered 2020-04-07: 17:00:00 2 [IU] via SUBCUTANEOUS
  Administered 2020-04-08: 1 [IU] via SUBCUTANEOUS
  Administered 2020-04-08: 3 [IU] via SUBCUTANEOUS
  Administered 2020-04-08: 08:00:00 1 [IU] via SUBCUTANEOUS
  Administered 2020-04-09 – 2020-04-11 (×4): 2 [IU] via SUBCUTANEOUS
  Administered 2020-04-11 – 2020-04-15 (×7): 1 [IU] via SUBCUTANEOUS
  Administered 2020-04-16: 17:00:00 3 [IU] via SUBCUTANEOUS
  Administered 2020-04-17: 08:00:00 1 [IU] via SUBCUTANEOUS
  Administered 2020-04-18: 5 [IU] via SUBCUTANEOUS
  Administered 2020-04-18 – 2020-04-19 (×2): 2 [IU] via SUBCUTANEOUS

## 2020-04-06 NOTE — Plan of Care (Signed)
  Problem: Fluid Volume: Goal: Compliance with measures to maintain balanced fluid volume will improve Outcome: Not Progressing

## 2020-04-06 NOTE — H&P (View-Only) (Signed)
      Progress Note   Subjective  Patient refused bowel prep overnight. No stools overnight / bleeding. Hgb 7.5 this AM.    Objective   Vital signs in last 24 hours: Temp:  [98.1 F (36.7 C)-100.7 F (38.2 C)] 100.7 F (38.2 C) (04/09 0551) Pulse Rate:  [70-80] 73 (04/09 0551) Resp:  [16-20] 16 (04/09 0551) BP: (102-136)/(51-78) 102/51 (04/09 0551) SpO2:  [97 %-100 %] 97 % (04/09 0551) Last BM Date: 04/05/20 General:    AA female in NAD Abdomen:  Soft, nontender, nondistended.  Neurologic:  Alert and oriented,  grossly normal neurologically. Psych:  Cooperative. Normal mood and affect.  Intake/Output from previous day: 04/08 0701 - 04/09 0700 In: 100 [I.V.:100] Out: -  Intake/Output this shift: No intake/output data recorded.  Lab Results: Recent Labs    04/05/20 0817 04/05/20 1842 04/06/20 0715  WBC 8.8 10.1 9.6  HGB 6.9* 8.0* 7.5*  HCT 22.9* 26.0* 24.1*  PLT 275 296 289   BMET Recent Labs    04/03/20 1630 04/05/20 0817 04/06/20 0715  NA 137 138 134*  K 3.9 4.3 4.5  CL 94* 94* 90*  CO2 28 27 25   GLUCOSE 149* 128* 66*  BUN 36* 60* 69*  CREATININE 4.87* 8.04* 9.14*  CALCIUM 8.1* 8.5* 8.3*   LFT Recent Labs    04/06/20 0715  ALBUMIN 2.7*   PT/INR Recent Labs    04/03/20 1630  LABPROT 15.2  INR 1.2    Studies/Results: DG CHEST PORT 1 VIEW  Result Date: 04/04/2020 CLINICAL DATA:  69 year old female with fever. EXAM: PORTABLE CHEST 1 VIEW COMPARISON:  Chest radiograph dated 02/24/2020. FINDINGS: Right-sided dialysis catheter in similar position. There is cardiomegaly with mild vascular congestion. No focal consolidation, pleural effusion or pneumothorax. Median sternotomy wires and CABG vascular clips. No acute osseous pathology. IMPRESSION: Cardiomegaly with mild vascular congestion. No focal consolidation. Electronically Signed   By: Anner Crete M.D.   On: 04/04/2020 17:29       Assessment / Plan:   . 69 y/o female ESRD on HD,  presenting with recurrent GI bleeding. She has had extensive evaluation in the past endoscopically, most recently 2/201 with AVMs noted in the stomach, small bowel, and colon -  actively bleeding in the colon and treated at that time. She is at risk for AVMs with her CKD, and this is the likely cause of her recurrent anemia / bleeding.   I have recommended enteroscopy / colonoscopy to further evaluate this given recurrent anemia / bleeding. When we spoke yesterday she was agreeable to this, but refused the prep overnight. I discussed with her again this AM why these studies are important. Given active bleeding in her colon on the last exam the colonoscopy is very important. I again asked her to drink the prep tonight with plans for endoscopic evaluation tomorrow, and she is agreeable.   Clear liquids today, bowel prep tonight, enteroscopy / colonoscopy tomorrow.  Await response to RBC transfusion today, trend Hgb and monitor for recurrent bleeding. Call with questions.   [place] Cellar, MD [facility]

## 2020-04-06 NOTE — Progress Notes (Addendum)
Subjective:  Noted refusing GI prep,No sob, asking for food   Objective Vital signs in last 24 hours: Vitals:   04/05/20 1721 04/05/20 1814 04/05/20 2128 04/06/20 0551  BP: 136/73 128/78 (!) 133/53 (!) 102/51  Pulse: 73 74 80 73  Resp: '18 20 20 16  ' Temp: 98.1 F (36.7 C) 98.6 F (37 C) 99.4 F (37.4 C) (!) 100.7 F (38.2 C)  TempSrc: Oral Oral Oral Oral  SpO2: 100% 100% 99% 97%  Weight:      Height:       Weight change:   Physical Exam: General:  alert , Obese female, nad  Lungs: Clear bilaterally  Heart: RRR  No f rub  Abdomen: soft non-tender  Lower extremities: No LE edema  Dialysis Access: R IJ TDC/ LUE AVF +bruit   Dialysis Orders:  [facility] TTS 4h36mn 400/800 EDW 118kg 2K/2.5Ca TDC No heparin Venofer 100 q HD until 4/17 Mircera 225 (last 4/1) Sensipar 30 Calcitriol 2.25 \  Problem/Plan: 1. Recurrent GI bleeding. Hx AVMs. GI following. Refusing prep this  Am and Dr ADuanne Guess Had discussion today and now  she is agreeable to drink prep tonight  2. Fever on admission - Covid negative. CXR clear. Blood cultures neg to date. Per primary  3. ESRD -  HD TTS. Has been using TDC.  s/p LUE AVF revision/transposition 2/9. Per [facility] note fistula ok to use 2 weeks from 3/5.  use AVF next HD. HD today 2/2 transfusion volume and GI Procedure tomor  4. Hypertension/volume  - BP low/stable. UF to EDW as tolerated  5. ABLA/Anemia CKD - Hgb 6.9. s/p 1 unit.admit and another unit yest hgb   7.5 hgb Recent ESA dose as outpatient. Continue Fe load here  if blood cultures remain neg.  6. Metabolic bone disease -  Continue binders/Sensipar/Calcitriol for now 7. Nutrition - Renal diet/prot supp    DErnest Haber PA-C [facility] [phone]/08/2020,1:51 PM  LOS: 2 days   Labs: Basic Metabolic Panel: Recent Labs  Lab 04/03/20 1630 04/05/20 0817 04/06/20 0715  NA 137 138 134*  K 3.9 4.3 4.5  CL 94* 94* 90*  CO2 '28 27 25  ' GLUCOSE 149* 128* 66*  BUN 36* 60* 69*   CREATININE 4.87* 8.04* 9.14*  CALCIUM 8.1* 8.5* 8.3*  PHOS  --  7.1* 7.1*   Liver Function Tests: Recent Labs  Lab 04/05/20 0817 04/06/20 0715  ALBUMIN 2.7* 2.7*   No results for input(s): LIPASE, AMYLASE in the last 168 hours. No results for input(s): AMMONIA in the last 168 hours. CBC: Recent Labs  Lab 04/03/20 1630 04/04/20 0309 04/05/20 0817 04/05/20 1842 04/06/20 0715  WBC 8.9  --  8.8 10.1 9.6  NEUTROABS 7.3  --   --   --   --   HGB 6.8*   < > 6.9* 8.0* 7.5*  HCT 22.4*   < > 22.9* 26.0* 24.1*  MCV 91.4  --  93.1 91.5 90.6  PLT 269  --  275 296 289   < > = values in this interval not displayed.   Cardiac Enzymes: No results for input(s): CKTOTAL, CKMB, CKMBINDEX, TROPONINI in the last 168 hours. CBG: Recent Labs  Lab 04/05/20 2007 04/05/20 2134 04/06/20 0742 04/06/20 0820 04/06/20 1144  GLUCAP 87 140* 64* 99 103*    Studies/Results: DG CHEST PORT 1 VIEW  Result Date: 04/04/2020 CLINICAL DATA:  69year old female with fever. EXAM: PORTABLE CHEST 1 VIEW COMPARISON:  Chest radiograph dated  02/24/2020. FINDINGS: Right-sided dialysis catheter in similar position. There is cardiomegaly with mild vascular congestion. No focal consolidation, pleural effusion or pneumothorax. Median sternotomy wires and CABG vascular clips. No acute osseous pathology. IMPRESSION: Cardiomegaly with mild vascular congestion. No focal consolidation. Electronically Signed   By: Anner Crete M.D.   On: 04/04/2020 17:29   Medications:  . sodium chloride   Intravenous Once  . allopurinol  100 mg Oral Daily  . atorvastatin  40 mg Oral q1800  . buPROPion  150 mg Oral BID  . Chlorhexidine Gluconate Cloth  6 each Topical Daily  . Chlorhexidine Gluconate Cloth  6 each Topical Q0600  . ferrous sulfate  325 mg Oral BID WC  . gabapentin  300 mg Oral QHS  . insulin aspart  0-5 Units Subcutaneous QHS  . insulin aspart  0-9 Units Subcutaneous TID WC  . insulin aspart  2 Units Subcutaneous  TID WC  . latanoprost  1 drop Both Eyes QHS  . linaclotide  72 mcg Oral QAC breakfast  . loratadine  10 mg Oral Daily  . metoprolol tartrate  25 mg Oral BID  . pantoprazole  40 mg Oral Daily  . peg 3350 powder  1 kit Oral Once  . sucroferric oxyhydroxide  1,000 mg Oral TID WC

## 2020-04-06 NOTE — Progress Notes (Addendum)
Vomited moderate amount of white colored vomitus. Will continue to monitor.

## 2020-04-06 NOTE — Progress Notes (Signed)
Spoke with Joaquim Lai RN, patient refusing prep. Will cancel case for today per Dr Havery Moros.

## 2020-04-06 NOTE — Progress Notes (Signed)
[facility] Progress Note  Patient: [name] [name]    TDH:[id number]  DOA: 04/03/2020     Date of Service: the patient was seen and examined on 04/06/2020  Chief Complaint  Patient presents with  . Abnormal Lab   Brief hospital course: Past medical history of ESRD on HD, CAD SP CABG, combined CHF, type II DM, CVA.  Recent anemia.  Patient presented with complaints of abnormal hemoglobin from hemodialysis.  She was transfused PRBC.  After that she started having Sirs-like reaction.  Patient was transferred to [facility] for hemodialysis need and continuous observation for her sepsis-like picture. Patient continues to have low hemoglobin and therefore GI was consulted and is now scheduled for colonoscopy tomorrow Currently further plan is monitor results for colonoscopy.  Assessment and Plan: 1.  Lower GI bleed Patient reports hematochezia. Hemoglobin dropping down after 1 PRBC transfusion to less than 7. On repeat another PRBC transfusion today  Monitor H&H. Patient will be prepped for colonoscopy tomorrow.  Patient refused prep on the night of 04/05/2020. Monitor results. Prior colonoscopy last February was suggestive of an AVM treated with clips.  2.  ESRD on HD Admitted to [facility] for ESRD. Continue HD appreciate dermatology input.  3.  History of combined chronic CHF Currently euvolemic.  Monitor.  4.  Type 2 diabetes mellitus with renal complication.  Uncontrolled with hyperglycemia Currently on sliding scale insulin.  5.  Essential hypertension. Blood pressure stable.  Monitor.   Body mass index is 39.68 kg/m.   Diet: Clear liquid diet DVT Prophylaxis: SCD, pharmacological prophylaxis contraindicated due to Due to GI bleed   Advance goals of care discussion: Full code  Family Communication: no family was present at bedside, at the time of interview.   Disposition:  Pt is from home, admitted with GI bleed, still has anemia requiring  transfusion, which precludes a safe discharge. Discharge to home, when medically stable.  Subjective: Denies any bowel movement.  No nausea no vomiting.  Physical Exam: General:  alert oriented to time, place, and person.  Appear in mild distress, affect appropriate Eyes: PERRL ENT: Oral Mucosa Clear, moist  Neck: no JVD,  Cardiovascular: S1 and S2 Present, no Murmur,  Respiratory: good respiratory effort, Bilateral Air entry equal and Decreased, no Crackles, no wheezes Abdomen: Bowel Sound present, Soft and no tenderness,  Skin: no rash Extremities: no Pedal edema, no calf tenderness Neurologic: without any new focal findings  Gait not checked due to patient safety concerns  Vitals:   04/05/20 1814 04/05/20 2128 04/06/20 0551 04/06/20 1502  BP: 128/78 (!) 133/53 (!) 102/51 (!) 122/56  Pulse: 74 80 73 71  Resp: _0 Temp: 98.6 F (37 C) 99.4 F (37.4 C) (!) 100.7 F (38.2 C) 98.8 F (37.1 C)  TempSrc: Oral Oral Oral Oral  SpO2: 100% 99% 97% 100%  Weight:      Height:       No intake or output data in the 24 hours ending 04/06/20 1801 Filed Weights   04/03/20 1548  Weight: 118.4 kg    Data Reviewed: I have personally reviewed and interpreted daily labs, tele strips, imagings as discussed above. I reviewed all nursing notes, pharmacy notes, vitals, pertinent old records I have discussed plan of care as described above with RN and patient/family.  CBC: Recent Labs  Lab 04/03/20 1630 04/04/20 0309 04/05/20 0817 04/05/20 1842 04/06/20 0715  WBC 8.9  --  8.8 10.1 9.6  NEUTROABS  7.3  --   --   --   --   HGB 6.8* 7.6* 6.9* 8.0* 7.5*  HCT 22.4* 25.0* 22.9* 26.0* 24.1*  MCV 91.4  --  93.1 91.5 90.6  PLT 269  --  275 296 859   Basic Metabolic Panel: Recent Labs  Lab 04/03/20 1630 04/05/20 0817 04/06/20 0715  NA 137 138 134*  K 3.9 4.3 4.5  CL 94* 94* 90*  CO2 _0 GLUCOSE 149* 128* 66*  BUN 36* 60* 69*  CREATININE 4.87* 8.04* 9.14*  CALCIUM  8.1* 8.5* 8.3*  PHOS  --  7.1* 7.1*    Studies: No results found.  Scheduled Meds: . sodium chloride   Intravenous Once  . allopurinol  100 mg Oral Daily  . atorvastatin  40 mg Oral q1800  . buPROPion  150 mg Oral BID  . Chlorhexidine Gluconate Cloth  6 each Topical Daily  . Chlorhexidine Gluconate Cloth  6 each Topical Q0600  . ferrous sulfate  325 mg Oral BID WC  . gabapentin  300 mg Oral QHS  . insulin aspart  0-5 Units Subcutaneous QHS  . insulin aspart  0-9 Units Subcutaneous TID WC  . insulin aspart  2 Units Subcutaneous TID WC  . latanoprost  1 drop Both Eyes QHS  . linaclotide  72 mcg Oral QAC breakfast  . loratadine  10 mg Oral Daily  . metoprolol tartrate  25 mg Oral BID  . pantoprazole  40 mg Oral Daily  . peg 3350 powder  1 kit Oral Once  . sucroferric oxyhydroxide  1,000 mg Oral TID WC   Continuous Infusions: PRN Meds: acetaminophen, albuterol, mometasone-formoterol, nitroGLYCERIN, oxyCODONE, polyethylene glycol  Time spent: 35 minutes  Author: Berle Mull, MD [facility] 04/06/2020 6:01 PM  To reach On-call, see care teams to locate the attending and reach out to them via [email or web address]. If 7PM-7AM, please contact night-coverage If you still have difficulty reaching the attending provider, please page the [facility] (Director on Call) for [facility] on amion for assistance.

## 2020-04-06 NOTE — Progress Notes (Signed)
Pt incontinent of large amount black liquid stool. Hygiene/peri care provided.Pt also had moderate amount of dark liquid stool in bedpan immediately after incontinent episode. Pt has only consumed 1 partial cup of her Movi Prep. Encouraged pt to drink the prep. She stated that she would.

## 2020-04-06 NOTE — Progress Notes (Signed)
Inpatient Diabetes Program Recommendations  AACE/ADA: New Consensus Statement on Inpatient Glycemic Control (2015)  Target Ranges:  Prepandial:   less than 140 mg/dL      Peak postprandial:   less than 180 mg/dL (1-2 hours)      Critically ill patients:  140 - 180 mg/dL   Lab Results  Component Value Date   GLUCAP 99 04/06/2020   HGBA1C 5.1 04/03/2020    Review of Glycemic Control  Diabetes history: DM 2 Outpatient Diabetes medications: Novolog 2 units tid meal coverage Current orders for Inpatient glycemic control:  Novolog 0-20 units tid  Novolog 2 units tid meal coverage  Inpatient Diabetes Program Recommendations:    Hypoglycemia this am in the 60's. Renal function elevated.   Consider reducing Novolog correction scale to 0-9 units tid.  Thanks,  Tama Headings RN, MSN, BC-ADM Inpatient Diabetes Coordinator Team Pager [phone] (8a-5p)

## 2020-04-06 NOTE — Progress Notes (Signed)
Pt's temp 100.7 this am . Medicated her with Tylenol. Text paged M. Sharlet Salina with this info.Informed Brandy RN in Endo that pt has only consumed 1 partial cup of her Movi Prep. Pt has not passed any stool since yesterday evening. Theadora Rama stated she will inform Dr. Havery Moros.

## 2020-04-06 NOTE — Progress Notes (Signed)
      Progress Note   Subjective  Patient refused bowel prep overnight. No stools overnight / bleeding. Hgb 7.5 this AM.    Objective   Vital signs in last 24 hours: Temp:  [98.1 F (36.7 C)-100.7 F (38.2 C)] 100.7 F (38.2 C) (04/09 0551) Pulse Rate:  [70-80] 73 (04/09 0551) Resp:  [16-20] 16 (04/09 0551) BP: (102-136)/(51-78) 102/51 (04/09 0551) SpO2:  [97 %-100 %] 97 % (04/09 0551) Last BM Date: 04/05/20 General:    AA female in NAD Abdomen:  Soft, nontender, nondistended.  Neurologic:  Alert and oriented,  grossly normal neurologically. Psych:  Cooperative. Normal mood and affect.  Intake/Output from previous day: 04/08 0701 - 04/09 0700 In: 100 [I.V.:100] Out: -  Intake/Output this shift: No intake/output data recorded.  Lab Results: Recent Labs    04/05/20 0817 04/05/20 1842 04/06/20 0715  WBC 8.8 10.1 9.6  HGB 6.9* 8.0* 7.5*  HCT 22.9* 26.0* 24.1*  PLT 275 296 289   BMET Recent Labs    04/03/20 1630 04/05/20 0817 04/06/20 0715  NA 137 138 134*  K 3.9 4.3 4.5  CL 94* 94* 90*  CO2 28 27 25   GLUCOSE 149* 128* 66*  BUN 36* 60* 69*  CREATININE 4.87* 8.04* 9.14*  CALCIUM 8.1* 8.5* 8.3*   LFT Recent Labs    04/06/20 0715  ALBUMIN 2.7*   PT/INR Recent Labs    04/03/20 1630  LABPROT 15.2  INR 1.2    Studies/Results: DG CHEST PORT 1 VIEW  Result Date: 04/04/2020 CLINICAL DATA:  69 year old female with fever. EXAM: PORTABLE CHEST 1 VIEW COMPARISON:  Chest radiograph dated 02/24/2020. FINDINGS: Right-sided dialysis catheter in similar position. There is cardiomegaly with mild vascular congestion. No focal consolidation, pleural effusion or pneumothorax. Median sternotomy wires and CABG vascular clips. No acute osseous pathology. IMPRESSION: Cardiomegaly with mild vascular congestion. No focal consolidation. Electronically Signed   By: Anner Crete M.D.   On: 04/04/2020 17:29       Assessment / Plan:   . 69 y/o female ESRD on HD,  presenting with recurrent GI bleeding. She has had extensive evaluation in the past endoscopically, most recently 2/201 with AVMs noted in the stomach, small bowel, and colon -  actively bleeding in the colon and treated at that time. She is at risk for AVMs with her CKD, and this is the likely cause of her recurrent anemia / bleeding.   I have recommended enteroscopy / colonoscopy to further evaluate this given recurrent anemia / bleeding. When we spoke yesterday she was agreeable to this, but refused the prep overnight. I discussed with her again this AM why these studies are important. Given active bleeding in her colon on the last exam the colonoscopy is very important. I again asked her to drink the prep tonight with plans for endoscopic evaluation tomorrow, and she is agreeable.   Clear liquids today, bowel prep tonight, enteroscopy / colonoscopy tomorrow.  Await response to RBC transfusion today, trend Hgb and monitor for recurrent bleeding. Call with questions.   [place] Cellar, MD [facility]

## 2020-04-07 ENCOUNTER — Encounter (HOSPITAL_COMMUNITY)
Admission: EM | Disposition: A | Discharge status: Skilled Nursing Facility | Payer: Self-pay | Source: Skilled Nursing Facility | Attending: Internal Medicine

## 2020-04-07 ENCOUNTER — Inpatient Hospital Stay (HOSPITAL_COMMUNITY): Payer: Medicare (Managed Care) | Admitting: Anesthesiology

## 2020-04-07 DIAGNOSIS — K31819 Angiodysplasia of stomach and duodenum without bleeding: Secondary | ICD-10-CM

## 2020-04-07 DIAGNOSIS — K922 Gastrointestinal hemorrhage, unspecified: Secondary | ICD-10-CM | POA: Diagnosis not present

## 2020-04-07 HISTORY — PX: HOT HEMOSTASIS: SHX5433

## 2020-04-07 HISTORY — PX: ENTEROSCOPY: SHX5533

## 2020-04-07 LAB — GLUCOSE, CAPILLARY
Glucose-Capillary: 139 mg/dL — ABNORMAL HIGH (ref 70–99)
Glucose-Capillary: 131 mg/dL — ABNORMAL HIGH (ref 70–99)
Glucose-Capillary: 82 mg/dL (ref 70–99)

## 2020-04-07 SURGERY — ENTEROSCOPY
Anesthesia: Monitor Anesthesia Care

## 2020-04-07 MED ORDER — PROPOFOL 500 MG/50ML IV EMUL
INTRAVENOUS | Status: DC | PRN
  Administered 2020-04-07: 100 ug/kg/min via INTRAVENOUS

## 2020-04-07 MED ORDER — SODIUM CHLORIDE 0.9 % IV SOLN: INTRAVENOUS | Status: DC | PRN

## 2020-04-07 MED ORDER — HEPARIN SODIUM (PORCINE) 1000 UNIT/ML IJ SOLN
1500.0000 [IU] | Freq: Once | INTRAMUSCULAR | Status: AC
  Administered 2020-04-07: 1500 [IU]
  Filled 2020-04-07: qty 1.5

## 2020-04-07 MED ORDER — PHENYLEPHRINE 40 MCG/ML (10ML) SYRINGE FOR IV PUSH (FOR BLOOD PRESSURE SUPPORT)
PREFILLED_SYRINGE | INTRAVENOUS | Status: DC | PRN
  Administered 2020-04-07 (×2): 80 ug via INTRAVENOUS

## 2020-04-07 MED ORDER — SODIUM CHLORIDE 0.9 % IV SOLN: INTRAVENOUS | Status: DC

## 2020-04-07 MED ORDER — CEPHALEXIN 500 MG PO CAPS
500.0000 mg | ORAL_CAPSULE | Freq: Two times a day (BID) | ORAL | Status: DC
  Administered 2020-04-07 – 2020-04-09 (×4): 500 mg via ORAL
  Filled 2020-04-07 (×4): qty 1

## 2020-04-07 MED ORDER — CEPHALEXIN 500 MG PO CAPS
500.0000 mg | ORAL_CAPSULE | Freq: Two times a day (BID) | ORAL | Status: DC
  Administered 2020-04-07: 500 mg via ORAL
  Filled 2020-04-07: qty 1

## 2020-04-07 MED ORDER — CEPHALEXIN 500 MG PO CAPS: 500.0000 mg | ORAL_CAPSULE | Freq: Four times a day (QID) | ORAL | Status: DC

## 2020-04-07 MED ORDER — PROPOFOL 10 MG/ML IV BOLUS
INTRAVENOUS | Status: DC | PRN
  Administered 2020-04-07 (×2): 10 mg via INTRAVENOUS

## 2020-04-07 SURGICAL SUPPLY — 22 items

## 2020-04-07 NOTE — Op Note (Signed)
[facility] Patient Name: [name] [name] Procedure Date : 04/07/2020 MRN: [id number] Attending MD: Carlota Raspberry. Havery Moros , MD Date of Birth: [birth year] CSN: [id number] Age: 69 Admit Type: Inpatient Procedure:                Small bowel enteroscopy Indications:              Obscure gastrointestinal bleeding - history of                            small bowel and colonic AVMs, GAVE - treatment in                            February with actively bleeding colonic AVM.                            Planned for enteroscopy and colonoscopy the last 2                            days - patient has initially accepted and then                            refused to drink bowel prep the past 2 days,                            declines colonoscopy. Proceeding with push                            enteroscopy to rule out active upper source. Providers:                Carlota Raspberry. Havery Moros, MD, Josie Dixon, RN,                            Benetta Spar RN, RN, Lazaro Arms, Technician Referring MD:              Medicines:                Monitored Anesthesia Care Complications:            No immediate complications. Estimated blood loss:                            Minimal. Estimated Blood Loss:     Estimated blood loss was minimal. Procedure:                Pre-Anesthesia Assessment:                           - Prior to the procedure, a History and Physical                            was performed, and patient medications and                            allergies were reviewed. The patient's tolerance of  previous anesthesia was also reviewed. The risks                            and benefits of the procedure and the sedation                            options and risks were discussed with the patient.                            All questions were answered, and informed consent                            was obtained. Prior Anticoagulants: The patient has                taken no previous anticoagulant or antiplatelet                            agents. ASA Grade Assessment: III - A patient with                            severe systemic disease. After reviewing the risks                            and benefits, the patient was deemed in                            satisfactory condition to undergo the procedure.                           After obtaining informed consent, the endoscope was                            passed under direct vision. Throughout the                            procedure, the patient's blood pressure, pulse, and                            oxygen saturations were monitored continuously. The                            PCF-H190DL ([id number] Olympus pediatric colonoscope                            was introduced through the mouth and advanced to                            the proximal jejunum. The small bowel enteroscopy                            was accomplished without difficulty. The patient                            tolerated the procedure  well. Scope In: Scope Out: Findings:      Esophagogastric landmarks were identified: the Z-line was found at 40       cm, the gastroesophageal junction was found at 40 cm and the upper       extent of the gastric folds was found at 44 cm from the incisors.      A 4 cm hiatal hernia was present.      The exam of the esophagus was otherwise normal.      The entire examined stomach was normal. Previously treated GAVE was       successful in comparison to interval changes from last exam in February.       Very mild subtle changes of GAVE in the antrum without stigmata for       bleeding, no treatment performed.      A single angiodysplastic lesion with no bleeding was found in the third       portion of the duodenum. Fulguration to ablate the lesion to prevent       bleeding by argon plasma was successful.      The exam of the duodenum was otherwise normal.      There was no evidence of  significant pathology in the proximal jejunum. Impression:               - Esophagogastric landmarks identified.                           - 4 cm hiatal hernia.                           - Normal stomach. Interval improvement of GAVE                            following APC in February                           - A single non-bleeding angiodysplastic lesion in                            the duodenum. Treated with argon plasma coagulation                            (APC).                           - The examined portion of the jejunum was normal.                           I think AVM in the duodenum likely incidental                            finding, had no stigmata for bleeding, think                            unlikely the cause for her recent bleeding. I have                            recommended colonoscopy the last 2 days,  given                            patient had actively bleeding colonic AVM as the                            cause of her symptoms in February, she continues to                            decline prep. In this light, capsule study and                            colonoscopy not going to be high yield if there is                            no bowel prep. Recommendation:           - Return patient to hospital ward for ongoing care.                           - Colonoscopy is again recommended, if she is                            willing to do a bowel preparation we will proceed,                            she has continued to decline however. If she is                            agreeable to colonoscopy please contact me, would                            need to be on clear liquid diet for bowel prep to                            do an exam the following day                           - Continue present medications.                           I think the patient is at risk for recurrent                            bleeding / anemia without completing her endoscopic                             evaluation with colonoscopy. As above she has                            declined colonoscopy the past 2 days, was only  agreeable to push enteroscopy. Will sign off for                            now unless she is agreeable to drink bowel prep for                            additional evaluation during this hospitalization.                            Call with questions. Procedure Code(s):        --- Professional ---                           [phone], Small intestinal endoscopy, enteroscopy                            beyond second portion of duodenum, not including                            ileum; with control of bleeding (eg, injection,                            bipolar cautery, unipolar cautery, laser, heater                            probe, stapler, plasma coagulator) Diagnosis Code(s):        --- Professional ---                           K44.9, Diaphragmatic hernia without obstruction or                            gangrene                           K31.819, Angiodysplasia of stomach and duodenum                            without bleeding                           K92.2, Gastrointestinal hemorrhage, unspecified CPT copyright 2019 [employer]. All rights reserved. The codes documented in this report are preliminary and upon coder review may  be revised to meet current compliance requirements. Remo Lipps P. Anea Fodera, MD 04/07/2020 8:57:48 AM This report has been signed electronically. Number of Addenda: 0

## 2020-04-07 NOTE — Transfer of Care (Signed)
Immediate Anesthesia Transfer of Care Note  Patient: [name] [name]  Procedure(s) Performed: ENTEROSCOPY (N/A ) HOT HEMOSTASIS (ARGON PLASMA COAGULATION/BICAP) (N/A )  Patient Location: Endoscopy Unit  Anesthesia Type:MAC  Level of Consciousness: sedated  Airway & Oxygen Therapy: Patient Spontanous Breathing and Patient connected to nasal cannula oxygen  Post-op Assessment: Report given to RN and Post -op Vital signs reviewed and stable  Post vital signs: Reviewed and stable  Last Vitals:  Vitals Value Taken Time  BP    Temp    Pulse 75 04/07/20 0850  Resp 18 04/07/20 0850  SpO2 98 % 04/07/20 0850  Vitals shown include unvalidated device data.  Last Pain:  Vitals:   04/07/20 0734  TempSrc: Axillary  PainSc: 8       Patients Stated Pain Goal: 3 (89/84/21 0312)  Complications: No apparent anesthesia complications

## 2020-04-07 NOTE — Progress Notes (Signed)
Coralee Rud, RN called and notified the patient's HD has been moved to 04/08/20.

## 2020-04-07 NOTE — Progress Notes (Signed)
Subjective:  No cos , hd yest ,( last hd 4/06/)agrees to short hd today if staffing able to keep on schedule/ noted am GI studies  EGD= AVM  Not bleeding Duodenum  and APC. She refused colonoscopy  Prep as GI recommends   Objective Vital signs in last 24 hours: Vitals:   04/07/20 0923 04/07/20 0946 04/07/20 1227 04/07/20 1413  BP:  (!) 124/46 99/63 (!) 92/45  Pulse: 81 72 62 63  Resp: 15 14 18 20   Temp:  99.4 F (37.4 C) 100.3 F (37.9 C) 98.5 F (36.9 C)  TempSrc:  Oral Oral Oral  SpO2: 96% 92% 98% 96%  Weight:      Height:       Weight change:   Physical Exam: General: alert , Obese female, nad Lungs:Clear bilaterally Heart:RRR  No f rub  Abdomen: softnon-tender Lower extremities:No LE edema Dialysis Access:R IJ TDC/ LUE AVF +bruit  Dialysis Orders: [facility] TTS 4h72min 400/800 EDW 118kg 2K/2.5Ca TDC No heparin Venofer 100 q HD until 4/17 Mircera 225 (last 4/1) Sensipar 30 Calcitriol 2.25\  Problem/Plan: 1. Recurrent GI bleeding. Hx AVMs. GI following.EGD  = Duoden AVM  "nonbld " , and per GI =pt refuses Coloncsopy Prep 2. Fever on admission - Covid negative. CXR clear. Blood cultures neg to date x 3days . Per primary/ has hd cath  Still using ,attempt avf use  Today  3. ESRD -HD TTS. , Hd yest  As prior last hd 4/06 , Has been using TDC. s/p LUE AVF revision/transposition 2/9. Per [facility] note fistula ok to use 2 weeks from 3/5.  use AVF next HD. HD 4/09 2/2 transfusion volume and GI Procedure 4/10  Today attempt avf use and 2hr hd to keep on schedule .  4. Hypertension/volume - BP low/stable. UF to EDW as tolerated 5. ABLA/Anemia CKD - Hgb 6.9. s/p 1 unit.admit and another unit 4/08  hgb   7.5 hgb> 8.6 ( 4/09)  Recent ESA dose as outpatient. Continue Fe load here if blood cultures remain neg.  6. Metabolic bone disease -Continue binders/Sensipar/Calcitriol for now 7. Nutrition -Renal diet/prot supp  [name], PA-C [facility]  [phone] 04/07/2020,2:17 PM  LOS: 3 days   Labs: Basic Metabolic Panel: Recent Labs  Lab 04/03/20 1630 04/05/20 0817 04/06/20 0715  NA 137 138 134*  K 3.9 4.3 4.5  CL 94* 94* 90*  CO2 28 27 25   GLUCOSE 149* 128* 66*  BUN 36* 60* 69*  CREATININE 4.87* 8.04* 9.14*  CALCIUM 8.1* 8.5* 8.3*  PHOS  --  7.1* 7.1*   Liver Function Tests: Recent Labs  Lab 04/05/20 0817 04/06/20 0715  ALBUMIN 2.7* 2.7*   No results for input(s): LIPASE, AMYLASE in the last 168 hours. No results for input(s): AMMONIA in the last 168 hours. CBC: Recent Labs  Lab 04/03/20 1630 04/04/20 0309 04/05/20 0817 04/05/20 0817 04/05/20 1842 04/06/20 0715 04/06/20 2319  WBC 8.9  --  8.8   < > 10.1 9.6 10.8*  NEUTROABS 7.3  --   --   --   --   --   --   HGB 6.8*   < > 6.9*   < > 8.0* 7.5* 8.6*  HCT 22.4*   < > 22.9*   < > 26.0* 24.1* 27.3*  MCV 91.4  --  93.1  --  91.5 90.6 89.8  PLT 269  --  275   < > 296 289 261   < > = values  in this interval not displayed.   Cardiac Enzymes: No results for input(s): CKTOTAL, CKMB, CKMBINDEX, TROPONINI in the last 168 hours. CBG: Recent Labs  Lab 04/06/20 0820 04/06/20 1144 04/06/20 1617 04/06/20 2342 04/07/20 1245  GLUCAP 99 103* 119* 167* 139*    Studies/Results: No results found. Medications:  . sodium chloride   Intravenous Once  . allopurinol  100 mg Oral Daily  . atorvastatin  40 mg Oral q1800  . buPROPion  150 mg Oral BID  . Chlorhexidine Gluconate Cloth  6 each Topical Daily  . Chlorhexidine Gluconate Cloth  6 each Topical Q0600  . ferrous sulfate  325 mg Oral BID WC  . gabapentin  300 mg Oral QHS  . insulin aspart  0-5 Units Subcutaneous QHS  . insulin aspart  0-9 Units Subcutaneous TID WC  . insulin aspart  2 Units Subcutaneous TID WC  . latanoprost  1 drop Both Eyes QHS  . linaclotide  72 mcg Oral QAC breakfast  . loratadine  10 mg Oral Daily  . metoprolol tartrate  25 mg Oral BID  . pantoprazole  40 mg Oral Daily  . sucroferric  oxyhydroxide  1,000 mg Oral TID WC

## 2020-04-07 NOTE — Progress Notes (Signed)
Pt is scheduled for enteroscopy/colonoscopy this am, pt once again did not take bowel prep, only took few sips. Endoscopy nurse informed.

## 2020-04-07 NOTE — Anesthesia Preprocedure Evaluation (Signed)
Anesthesia Evaluation  Patient identified by MRN, date of birth, ID band Patient awake    Reviewed: Allergy & Precautions, NPO status , Patient's Chart, lab work & pertinent test results  Airway Mallampati: I  TM Distance: >3 FB Neck ROM: Full    Dental   Pulmonary Current Smoker and Patient abstained from smoking.,    Pulmonary exam normal        Cardiovascular hypertension, + CAD, + Past MI and + CABG  Normal cardiovascular exam     Neuro/Psych Depression CVA    GI/Hepatic GERD  Medicated and Controlled,  Endo/Other  diabetes, Type 2, Oral Hypoglycemic Agents  Renal/GU      Musculoskeletal   Abdominal   Peds  Hematology   Anesthesia Other Findings   Reproductive/Obstetrics                             Anesthesia Physical Anesthesia Plan  ASA: III  Anesthesia Plan: MAC   Post-op Pain Management:    Induction: Intravenous  PONV Risk Score and Plan: 1 and Treatment may vary due to age or medical condition  Airway Management Planned: Nasal Cannula  Additional Equipment:   Intra-op Plan:   Post-operative Plan:   Informed Consent: I have reviewed the patients History and Physical, chart, labs and discussed the procedure including the risks, benefits and alternatives for the proposed anesthesia with the patient or authorized representative who has indicated his/her understanding and acceptance.       Plan Discussed with: CRNA and Surgeon  Anesthesia Plan Comments:         Anesthesia Quick Evaluation

## 2020-04-07 NOTE — Progress Notes (Signed)
Pt returned to 6N07 via bed. Received report from [place] in Endo. See assessment. Will continue to monitor.

## 2020-04-07 NOTE — Anesthesia Postprocedure Evaluation (Signed)
Anesthesia Post Note  Patient: AMBRE KOBAYASHI  Procedure(s) Performed: ENTEROSCOPY (N/A ) HOT HEMOSTASIS (ARGON PLASMA COAGULATION/BICAP) (N/A )     Patient location during evaluation: PACU Anesthesia Type: MAC Level of consciousness: awake and alert Pain management: pain level controlled Vital Signs Assessment: post-procedure vital signs reviewed and stable Respiratory status: spontaneous breathing, nonlabored ventilation, respiratory function stable and patient connected to nasal cannula oxygen Cardiovascular status: stable and blood pressure returned to baseline Postop Assessment: no apparent nausea or vomiting Anesthetic complications: no    Last Vitals:  Vitals:   04/07/20 0850 04/07/20 0900  BP: (!) 98/26 (!) 127/44  Pulse: 74 67  Resp: 18 19  Temp: (!) 36.1 C   SpO2: 97% 99%    Last Pain:  Vitals:   04/07/20 0850  TempSrc: Temporal  PainSc: Asleep                 Hildagarde Holleran DAVID

## 2020-04-07 NOTE — Interval H&P Note (Signed)
History and Physical Interval Note: Patient has refused to drink bowel prep for colonoscopy the last 2 days. Will proceed with upper endoscopy today to see if source of bleeding is there. She declines colonoscopy even though she had active bleeding there on the last exam.   04/07/2020 7:20 AM  [place]  has presented today for surgery, with the diagnosis of gi bleeding, anemia.  The various methods of treatment have been discussed with the patient and family. After consideration of risks, benefits and other options for treatment, the patient has consented to  Procedure(s): ENTEROSCOPY (N/A) COLONOSCOPY WITH PROPOFOL (N/A) as a surgical intervention.  The patient's history has been reviewed, patient examined, no change in status, stable for surgery.  I have reviewed the patient's chart and labs.  Questions were answered to the patient's satisfaction.     [place]

## 2020-04-07 NOTE — Plan of Care (Signed)

## 2020-04-07 NOTE — Progress Notes (Signed)
[facility] Progress Note  Patient: [name] [name]    PNT:[id number]  DOA: 04/03/2020     Date of Service: the patient was seen and examined on 04/07/2020  Chief Complaint  Patient presents with  . Abnormal Lab   Brief hospital course: Past medical history of ESRD on HD, CAD SP CABG, combined CHF, type II DM, CVA.  Recent anemia.  Patient presented with complaints of abnormal hemoglobin from hemodialysis.  She was transfused PRBC.  After that she started having Sirs-like reaction.  Patient was transferred to [facility] for hemodialysis need and continuous observation for her sepsis-like picture. Patient continues to have low hemoglobin and therefore GI was consulted.  Patient has refused bowel prep x2 so far.  GI has signed off.  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 Currently further plan is monitor for fever reoccurrence and bleeding  Assessment and Plan: 1.  Lower GI bleed SP EGD, duodenal AVM treated with APC Patient reports hematochezia. SP 2 PRBC. Monitor H&H. Patient has refused colonoscopy prep x2. [place] [facility] was consulted appreciate assistance. SP upper GI endoscopy. Already candidate for capsule endoscopy given no bowel prep. Remains at risk for further GI bleed. Prior colonoscopy last February was suggestive of an AVM treated with clips.  2.  ESRD on HD Admitted to [facility] for ESRD. Continue HD appreciate nephrology input.  3.  History of combined chronic CHF Currently euvolemic.  Monitor.  4.  Type 2 diabetes mellitus with renal complication.  Uncontrolled with hyperglycemia Currently on sliding scale insulin.  5.  Essential hypertension. Blood pressure stable.  Monitor.  6.  SIRS-like picture. ?  Cellulitis.  Nonhealing left leg ulcer. PVD. Patient has recurrent fever. First time her fever was seen after blood transfusion. Cultures so far are negative and other work-up is also negative for any active infection. Had another  fever episode on 04/06/2020. This was not in connection with blood transfusion. And also low-grade fever on 04/07/2020. Currently I will add oral Keflex to cover her for cellulitis presumed on left leg. Patient does have PVD and nonhealing left leg ulcer for which she is supposed to follow-up with vascular surgery. Unable to provide any antiplatelet medication given her recurrent bleeding and lack of further work-up.   Body mass index is 38.32 kg/m.   Diet: Clear liquid diet DVT Prophylaxis: SCD, pharmacological prophylaxis contraindicated due to Due to GI bleed   Advance goals of care discussion: Full code  Family Communication: no family was present at bedside, at the time of interview.   Disposition:  Pt is from home, admitted with GI bleed, still has anemia requiring transfusion, which precludes a safe discharge. Discharge to home, when medically stable.  Subjective: No nausea no vomiting.  Refused bowel prep last night again.  No chest pain abdomen.  Physical Exam: General:  alert oriented to time, place, and person.  Appear in mild distress, affect appropriate Eyes: PERRL ENT: Oral Mucosa Clear, moist  Neck: no JVD,  Cardiovascular: S1 and S2 Present, no Murmur,  Respiratory: good respiratory effort, Bilateral Air entry equal and Decreased, no Crackles, no wheezes Abdomen: Bowel Sound present, Soft and no tenderness,  Skin: no rash Extremities: no Pedal edema, no calf tenderness Neurologic: without any new focal findings  Gait not checked due to patient safety concerns  Vitals:   04/07/20 0946 04/07/20 1227 04/07/20 1413 04/07/20 1836  BP: (!) 124/46 99/63 (!) 92/45 (!) 104/55  Pulse: 72 62 63 63  Resp: 14 18 20 14   Temp: 99.4 F (37.4 C) 100.3 F (37.9 C) 98.5 F (36.9 C) 99.3 F (37.4 C)  TempSrc: Oral Oral Oral Oral  SpO2: 92% 98% 96% 98%  Weight:      Height:        Intake/Output Summary (Last 24 hours) at 04/07/2020 1915 Last data filed at 04/07/2020  1049 Gross per 24 hour  Intake 600 ml  Output --  Net 600 ml   Filed Weights   04/06/20 1917 04/06/20 2245 04/07/20 0734  Weight: 123.7 kg 124.7 kg 114.3 kg    Data Reviewed: I have personally reviewed and interpreted daily labs, tele strips, imagings as discussed above. I reviewed [name] nursing notes, pharmacy notes, vitals, pertinent old records I have discussed plan of care as described above with RN and patient/family.  CBC: Recent Labs  Lab 04/03/20 1630 04/03/20 1630 04/04/20 0309 04/05/20 0817 04/05/20 1842 04/06/20 0715 04/06/20 2319  WBC 8.9  --   --  8.8 10.1 9.6 10.8*  NEUTROABS 7.3  --   --   --   --   --   --   HGB 6.8*   < > 7.6* 6.9* 8.0* 7.5* 8.6*  HCT 22.4*   < > 25.0* 22.9* 26.0* 24.1* 27.3*  MCV 91.4  --   --  93.1 91.5 90.6 89.8  PLT 269  --   --  275 296 289 261   < > = values in this interval not displayed.   Basic Metabolic Panel: Recent Labs  Lab 04/03/20 1630 04/05/20 0817 04/06/20 0715  NA 137 138 134*  K 3.9 4.3 4.5  CL 94* 94* 90*  CO2 28 27 25   GLUCOSE 149* 128* 66*  BUN 36* 60* 69*  CREATININE 4.87* 8.04* 9.14*  CALCIUM 8.1* 8.5* 8.3*  PHOS  --  7.1* 7.1*    Studies: No results found.  Scheduled Meds: . sodium chloride   Intravenous Once  . allopurinol  100 mg Oral Daily  . atorvastatin  40 mg Oral q1800  . buPROPion  150 mg Oral BID  . cephALEXin  500 mg Oral Q12H  . Chlorhexidine Gluconate Cloth  6 each Topical Daily  . Chlorhexidine Gluconate Cloth  6 each Topical Q0600  . ferrous sulfate  325 mg Oral BID WC  . gabapentin  300 mg Oral QHS  . insulin aspart  0-5 Units Subcutaneous QHS  . insulin aspart  0-9 Units Subcutaneous TID WC  . insulin aspart  2 Units Subcutaneous TID WC  . latanoprost  1 drop Both Eyes QHS  . linaclotide  72 mcg Oral QAC breakfast  . loratadine  10 mg Oral Daily  . metoprolol tartrate  25 mg Oral BID  . pantoprazole  40 mg Oral Daily  . sucroferric oxyhydroxide  1,000 mg Oral TID WC    Continuous Infusions: PRN Meds: acetaminophen, albuterol, mometasone-formoterol, nitroGLYCERIN, oxyCODONE, polyethylene glycol  Time spent: 35 minutes  Author: Berle Mull, MD [facility] 04/07/2020 7:15 PM  To reach On-call, see care teams to locate the attending and reach out to them via [email or web address]. If 7PM-7AM, please contact night-coverage If you still have difficulty reaching the attending provider, please page the [facility] (Director on Call) for [facility] on amion for assistance.

## 2020-04-08 LAB — RENAL FUNCTION PANEL
Albumin: 2.3 g/dL — ABNORMAL LOW (ref 3.5–5.0)
Anion gap: 16 — ABNORMAL HIGH (ref 5–15)
BUN: 48 mg/dL — ABNORMAL HIGH (ref 8–23)
CO2: 23 mmol/L (ref 22–32)
Calcium: 8 mg/dL — ABNORMAL LOW (ref 8.9–10.3)
Chloride: 94 mmol/L — ABNORMAL LOW (ref 98–111)
Creatinine, Ser: 7.19 mg/dL — ABNORMAL HIGH (ref 0.44–1.00)
GFR calc Af Amer: 6 mL/min — ABNORMAL LOW (ref >60)
GFR calc non Af Amer: 5 mL/min — ABNORMAL LOW (ref >60)
Glucose, Bld: 138 mg/dL — ABNORMAL HIGH (ref 70–99)
Phosphorus: 5.7 mg/dL — ABNORMAL HIGH (ref 2.5–4.6)
Potassium: 3.9 mmol/L (ref 3.5–5.1)
Sodium: 133 mmol/L — ABNORMAL LOW (ref 135–145)

## 2020-04-08 LAB — GLUCOSE, CAPILLARY
Glucose-Capillary: 132 mg/dL — ABNORMAL HIGH (ref 70–99)
Glucose-Capillary: 134 mg/dL — ABNORMAL HIGH (ref 70–99)
Glucose-Capillary: 226 mg/dL — ABNORMAL HIGH (ref 70–99)
Glucose-Capillary: 110 mg/dL — ABNORMAL HIGH (ref 70–99)

## 2020-04-08 LAB — CBC
HCT: 23.5 % — ABNORMAL LOW (ref 36.0–46.0)
Hemoglobin: 7.4 g/dL — ABNORMAL LOW (ref 12.0–15.0)
MCH: 28.8 pg (ref 26.0–34.0)
MCHC: 31.5 g/dL (ref 30.0–36.0)
MCV: 91.4 fL (ref 80.0–100.0)
Platelets: 251 10*3/uL (ref 150–400)
RBC: 2.57 MIL/uL — ABNORMAL LOW (ref 3.87–5.11)
RDW: 18.1 % — ABNORMAL HIGH (ref 11.5–15.5)
WBC: 7.7 10*3/uL (ref 4.0–10.5)
nRBC: 0 % (ref 0.0–0.2)

## 2020-04-08 LAB — HEPATITIS B SURFACE ANTIGEN: Hepatitis B Surface Ag: NONREACTIVE

## 2020-04-08 MED ORDER — ALTEPLASE 2 MG IJ SOLR: 2.0000 mg | Freq: Once | INTRAMUSCULAR | Status: DC | PRN

## 2020-04-08 MED ORDER — LIDOCAINE-PRILOCAINE 2.5-2.5 % EX CREA: 1.0000 "application " | TOPICAL_CREAM | CUTANEOUS | Status: DC | PRN

## 2020-04-08 MED ORDER — SENNOSIDES-DOCUSATE SODIUM 8.6-50 MG PO TABS
1.0000 | ORAL_TABLET | Freq: Two times a day (BID) | ORAL | Status: DC
  Administered 2020-04-08 – 2020-04-17 (×12): 1 via ORAL
  Filled 2020-04-08 (×17): qty 1

## 2020-04-08 MED ORDER — PENTAFLUOROPROP-TETRAFLUOROETH EX AERO: 1.0000 "application " | INHALATION_SPRAY | CUTANEOUS | Status: DC | PRN

## 2020-04-08 MED ORDER — POLYETHYLENE GLYCOL 3350 17 G PO PACK
17.0000 g | PACK | Freq: Two times a day (BID) | ORAL | Status: DC
  Administered 2020-04-08 – 2020-04-17 (×13): 17 g via ORAL
  Filled 2020-04-08 (×17): qty 1

## 2020-04-08 MED ORDER — SODIUM CHLORIDE 0.9 % IV SOLN: 100.0000 mL | INTRAVENOUS | Status: DC | PRN

## 2020-04-08 MED ORDER — LIDOCAINE HCL (PF) 1 % IJ SOLN: 5.0000 mL | INTRAMUSCULAR | Status: DC | PRN

## 2020-04-08 MED ORDER — HEPARIN SODIUM (PORCINE) 1000 UNIT/ML DIALYSIS: 1000.0000 [IU] | INTRAMUSCULAR | Status: DC | PRN

## 2020-04-08 NOTE — Progress Notes (Signed)
[facility] Progress Note  Patient: [name] [name]    RKY:[id number]  DOA: 04/03/2020     Date of Service: the patient was seen and examined on 04/08/2020  Chief Complaint  Patient presents with  . Abnormal Lab   Brief hospital course: Past medical history of ESRD on HD, CAD SP CABG, combined CHF, type II DM, CVA.  Recent anemia.  Patient presented with complaints of abnormal hemoglobin from hemodialysis.  She was transfused PRBC.  After that she started having Sirs-like reaction.  Patient was transferred to [facility] for hemodialysis need and continuous observation for her sepsis-like picture. Patient continues to have low hemoglobin and therefore GI was consulted.  Patient has refused bowel prep x2 so far. GI has signed off. 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 Currently further plan is monitor for fever reoccurrence and bleeding.  Assessment and Plan: 1.  Lower GI bleed SP EGD, duodenal AVM treated with APC Patient reports hematochezia. SP 2 PRBC. Monitor H&H. Patient has refused colonoscopy prep x2. [facility] was consulted appreciate assistance. SP upper GI endoscopy. Not a candidate for capsule endoscopy given no bowel prep. Remains at risk for further GI bleed. Prior colonoscopy last February was suggestive of an AVM treated with clips. Hemoglobin trending down from 8.3-7.7. Patient does not report any bleeding so far.  2.  ESRD on HD Admitted to [facility] for ESRD. Continue HD appreciate nephrology input.  3.  History of combined chronic CHF Currently euvolemic.  Monitor.  4.  Type 2 diabetes mellitus with renal complication.  Uncontrolled with hyperglycemia Currently on sliding scale insulin.  5.  Essential hypertension. Blood pressure stable.  Monitor.  6.  SIRS-like picture. ?  Cellulitis.  Nonhealing left leg ulcer. PVD. Patient has recurrent fever. First time her fever was seen after blood transfusion. Cultures so far are  negative and other work-up is also negative for any active infection. Had another fever episode on 04/06/2020. This was not in connection with blood transfusion. And also low-grade fever on 04/07/2020. Currently I will add oral Keflex to cover her for cellulitis presumed on left leg. Patient does have PVD and nonhealing left leg ulcer for which she is supposed to follow-up with vascular surgery. Unable to provide any antiplatelet medication given her recurrent bleeding and lack of further work-up.  7. Morbid obesity. Body mass index is 41.13 kg/m.   Diet: Renal diet DVT Prophylaxis: SCD, pharmacological prophylaxis contraindicated due to Due to GI bleed   Advance goals of care discussion: Full code  Family Communication: no family was present at bedside, at the time of interview.   Disposition:  Pt is from home, admitted with GI bleed, still has anemia requiring transfusion as well as intermittent fever, which precludes a safe discharge. Discharge to home, when medically stable.  Subjective: No nausea no vomiting. Had low-grade temperature this morning. No diarrhea. Passing gas. No BM. No bleeding so far.  Physical Exam: General:  alert oriented to time, place, and person.  Appear in mild distress, affect appropriate Eyes: PERRL ENT: Oral Mucosa Clear, moist  Neck: no JVD,  Cardiovascular: S1 and S2 Present, no Murmur,  Respiratory: good respiratory effort, Bilateral Air entry equal and Decreased, no Crackles, no wheezes Abdomen: Bowel Sound present, Soft and no tenderness,  Skin: no rash Extremities: no Pedal edema, no calf tenderness Neurologic: without any new focal findings  Gait not checked due to patient safety concerns  Vitals:   04/08/20 0930 04/08/20  1000 04/08/20 1036 04/08/20 1425  BP: (!) 119/49 (!) 103/59 110/64 (!) 115/49  Pulse: 68 65 78 72  Resp:   18   Temp:   100.1 F (37.8 C) 99.6 F (37.6 C)  TempSrc:   Oral Oral  SpO2:   98% 95%  Weight:   122.7 kg     Height:        Intake/Output Summary (Last 24 hours) at 04/08/2020 1808 Last data filed at 04/08/2020 1036 Gross per 24 hour  Intake 120 ml  Output 1500 ml  Net -1380 ml   Filed Weights   04/07/20 0734 04/08/20 0830 04/08/20 1036  Weight: 114.3 kg 124.7 kg 122.7 kg    Data Reviewed: I have personally reviewed and interpreted daily labs, tele strips, imagings as discussed above. I reviewed all nursing notes, pharmacy notes, vitals, pertinent old records I have discussed plan of care as described above with RN and patient/family.  CBC: Recent Labs  Lab 04/03/20 1630 04/04/20 0309 04/05/20 0817 04/05/20 1842 04/06/20 0715 04/06/20 2319 04/08/20 1009  WBC 8.9  --  8.8 10.1 9.6 10.8* 7.7  NEUTROABS 7.3  --   --   --   --   --   --   HGB 6.8*   < > 6.9* 8.0* 7.5* 8.6* 7.4*  HCT 22.4*   < > 22.9* 26.0* 24.1* 27.3* 23.5*  MCV 91.4  --  93.1 91.5 90.6 89.8 91.4  PLT 269  --  275 296 289 261 251   < > = values in this interval not displayed.   Basic Metabolic Panel: Recent Labs  Lab 04/03/20 1630 04/05/20 0817 04/06/20 0715 04/08/20 1009  NA 137 138 134* 133*  K 3.9 4.3 4.5 3.9  CL 94* 94* 90* 94*  CO2 28 27 25 23   GLUCOSE 149* 128* 66* 138*  BUN 36* 60* 69* 48*  CREATININE 4.87* 8.04* 9.14* 7.19*  CALCIUM 8.1* 8.5* 8.3* 8.0*  PHOS  --  7.1* 7.1* 5.7*    Studies: No results found.  Scheduled Meds: . sodium chloride   Intravenous Once  . allopurinol  100 mg Oral Daily  . atorvastatin  40 mg Oral q1800  . buPROPion  150 mg Oral BID  . cephALEXin  500 mg Oral Q12H  . Chlorhexidine Gluconate Cloth  6 each Topical Daily  . Chlorhexidine Gluconate Cloth  6 each Topical Q0600  . ferrous sulfate  325 mg Oral BID WC  . gabapentin  300 mg Oral QHS  . insulin aspart  0-5 Units Subcutaneous QHS  . insulin aspart  0-9 Units Subcutaneous TID WC  . insulin aspart  2 Units Subcutaneous TID WC  . latanoprost  1 drop Both Eyes QHS  . linaclotide  72 mcg Oral QAC breakfast   . loratadine  10 mg Oral Daily  . metoprolol tartrate  25 mg Oral BID  . pantoprazole  40 mg Oral Daily  . polyethylene glycol  17 g Oral BID  . senna-docusate  1 tablet Oral BID  . sucroferric oxyhydroxide  1,000 mg Oral TID WC   Continuous Infusions: PRN Meds: acetaminophen, albuterol, mometasone-formoterol, nitroGLYCERIN, oxyCODONE  Time spent: 35 minutes  Author: Berle Mull, MD [facility] 04/08/2020 6:08 PM  To reach On-call, see care teams to locate the attending and reach out to them via [email or web address]. If 7PM-7AM, please contact night-coverage If you still have difficulty reaching the attending provider, please page the [facility] (Director on Call) for [facility] on [employer]  for assistance.

## 2020-04-08 NOTE — Plan of Care (Signed)
  Problem: Education: Goal: Knowledge of General Education information will improve Description: Including pain rating scale, medication(s)/side effects and non-pharmacologic comfort measures Outcome: Progressing   Problem: Clinical Measurements: Goal: Respiratory complications will improve Outcome: Progressing   Problem: Activity: Goal: Risk for activity intolerance will decrease Outcome: Progressing   Problem: Nutrition: Goal: Adequate nutrition will be maintained Outcome: Progressing   Problem: Elimination: Goal: Will not experience complications related to bowel motility Outcome: Progressing Goal: Will not experience complications related to urinary retention Outcome: Progressing   Problem: Pain Managment: Goal: General experience of comfort will improve Outcome: Progressing   Problem: Safety: Goal: Ability to remain free from injury will improve Outcome: Progressing   Problem: Skin Integrity: Goal: Risk for impaired skin integrity will decrease Outcome: Progressing

## 2020-04-08 NOTE — Progress Notes (Signed)
Subjective:  On Hd using AVF 1st use with out prob . ( no hd yest 2/2 staffing issues) denies abd pain  Or sob.  Objective Vital signs in last 24 hours: Vitals:   04/08/20 0835 04/08/20 0840 04/08/20 0900 04/08/20 0930  BP: (!) 109/59 (!) 113/47 (!) 82/34 (!) 119/49  Pulse: 70 83 76 68  Resp:      Temp:      TempSrc:      SpO2:      Weight:      Height:       Weight change: -9.393 kg  Physical Exam: General:ON HD alert ,Obese female, nad Lungs:Clear bilaterally Heart:RRRNo rub Abdomen: obese, softnon-tender Lower extremities:No LE edema/ Lft bandaged clean and dry  Dialysis Access: LUE AVF  Patent on hd, R IJ  Pcath   Dialysis Orders: [facility] TTS 4h52min 400/800 EDW 118kg 2K/2.5Ca TDC No heparin Venofer 100 q HD until 4/17 Mircera 225 (last 4/1) Sensipar 30 Calcitriol 2.25\  Problem/Plan: 1. Recurrent GI bleeding. Hx AVMs. GI consulted .EGD  = Duoden AVM  "nonbld " , and per GI =pt refuses Coloncsopy Prep x2 ,Gi signed off now 2. Fever on admission  And low grade in hosp. 100.4  This am  Covid negative. CXR clear. Blood cultures neg to date  . Per primarystarted Keflex po for Left LE cellulitis presumed with HO PVD sp L GRT Toe amp 02/09 ( [facility] ) / has hd cath  Still using ,attempt avf use  Today doing well when pt does not move arm  3. ESRD -HD TTS. , Hd 4/09   As prior last hd 4/06 , Has been using TDC. s/p LUE AVF revision/transposition 2/9. Per [facility] note fistula ok to use 2 weeks from 3/5. use AVF next HD= today .No HD yest 2/2 HD staffing issue / avf use success today   Unless she moves arm  4. Hypertension/volume - BP low/stable. UF to EDW as tolerated 5. ABLA/Anemia CKD - Hgb 6.9. s/p 1 unit.admit andanother unit4/08  hgb 7.5 hgb> 8.6 ( 4/09)>7.4 today  Recent ESA dose as outpatient.4/01 Continue IV Fe load here if blood cultures remain neg. But still febrile  /on po iron / 6. Metabolic bone disease -Continue binders/Sensipar/Calcitriol for  now 7. Nutrition -Renal diet/prot supp   [name], PA-C [facility] [phone] 04/08/2020,10:01 AM  LOS: 4 days   Labs: Basic Metabolic Panel: Recent Labs  Lab 04/03/20 1630 04/05/20 0817 04/06/20 0715  NA 137 138 134*  K 3.9 4.3 4.5  CL 94* 94* 90*  CO2 28 27 25   GLUCOSE 149* 128* 66*  BUN 36* 60* 69*  CREATININE 4.87* 8.04* 9.14*  CALCIUM 8.1* 8.5* 8.3*  PHOS  --  7.1* 7.1*   Liver Function Tests: Recent Labs  Lab 04/05/20 0817 04/06/20 0715  ALBUMIN 2.7* 2.7*   No results for input(s): LIPASE, AMYLASE in the last 168 hours. No results for input(s): AMMONIA in the last 168 hours. CBC: Recent Labs  Lab 04/03/20 1630 04/04/20 0309 04/05/20 0817 04/05/20 0817 04/05/20 1842 04/06/20 0715 04/06/20 2319  WBC 8.9  --  8.8   < > 10.1 9.6 10.8*  NEUTROABS 7.3  --   --   --   --   --   --   HGB 6.8*   < > 6.9*   < > 8.0* 7.5* 8.6*  HCT 22.4*   < > 22.9*   < > 26.0* 24.1* 27.3*  MCV 91.4  --  93.1  --  91.5 90.6 89.8  PLT 269  --  275   < > 296 289 261   < > = values in this interval not displayed.   Cardiac Enzymes: No results for input(s): CKTOTAL, CKMB, CKMBINDEX, TROPONINI in the last 168 hours. CBG: Recent Labs  Lab 04/06/20 2342 04/07/20 1245 04/07/20 1719 04/07/20 2056 04/08/20 0723  GLUCAP 167* 139* 131* 82 132*    Studies/Results: No results found. Medications: . sodium chloride    . sodium chloride     . sodium chloride   Intravenous Once  . allopurinol  100 mg Oral Daily  . atorvastatin  40 mg Oral q1800  . buPROPion  150 mg Oral BID  . cephALEXin  500 mg Oral Q12H  . Chlorhexidine Gluconate Cloth  6 each Topical Daily  . Chlorhexidine Gluconate Cloth  6 each Topical Q0600  . ferrous sulfate  325 mg Oral BID WC  . gabapentin  300 mg Oral QHS  . insulin aspart  0-5 Units Subcutaneous QHS  . insulin aspart  0-9 Units Subcutaneous TID WC  . insulin aspart  2 Units Subcutaneous TID WC  . latanoprost  1 drop  Both Eyes QHS  . linaclotide  72 mcg Oral QAC breakfast  . loratadine  10 mg Oral Daily  . metoprolol tartrate  25 mg Oral BID  . pantoprazole  40 mg Oral Daily  . sucroferric oxyhydroxide  1,000 mg Oral TID WC

## 2020-04-09 ENCOUNTER — Inpatient Hospital Stay (HOSPITAL_COMMUNITY): Payer: Medicare (Managed Care)

## 2020-04-09 DIAGNOSIS — F17210 Nicotine dependence, cigarettes, uncomplicated: Secondary | ICD-10-CM

## 2020-04-09 DIAGNOSIS — E1151 Type 2 diabetes mellitus with diabetic peripheral angiopathy without gangrene: Secondary | ICD-10-CM

## 2020-04-09 DIAGNOSIS — Z8673 Personal history of transient ischemic attack (TIA), and cerebral infarction without residual deficits: Secondary | ICD-10-CM

## 2020-04-09 DIAGNOSIS — I5042 Chronic combined systolic (congestive) and diastolic (congestive) heart failure: Secondary | ICD-10-CM

## 2020-04-09 DIAGNOSIS — F1410 Cocaine abuse, uncomplicated: Secondary | ICD-10-CM

## 2020-04-09 DIAGNOSIS — I132 Hypertensive heart and chronic kidney disease with heart failure and with stage 5 chronic kidney disease, or end stage renal disease: Secondary | ICD-10-CM

## 2020-04-09 DIAGNOSIS — N186 End stage renal disease: Secondary | ICD-10-CM

## 2020-04-09 DIAGNOSIS — Z9889 Other specified postprocedural states: Secondary | ICD-10-CM

## 2020-04-09 DIAGNOSIS — R509 Fever, unspecified: Secondary | ICD-10-CM

## 2020-04-09 DIAGNOSIS — Z886 Allergy status to analgesic agent status: Secondary | ICD-10-CM

## 2020-04-09 DIAGNOSIS — D649 Anemia, unspecified: Secondary | ICD-10-CM

## 2020-04-09 DIAGNOSIS — I2510 Atherosclerotic heart disease of native coronary artery without angina pectoris: Secondary | ICD-10-CM

## 2020-04-09 DIAGNOSIS — Z951 Presence of aortocoronary bypass graft: Secondary | ICD-10-CM

## 2020-04-09 DIAGNOSIS — L97929 Non-pressure chronic ulcer of unspecified part of left lower leg with unspecified severity: Secondary | ICD-10-CM

## 2020-04-09 DIAGNOSIS — Z992 Dependence on renal dialysis: Secondary | ICD-10-CM

## 2020-04-09 DIAGNOSIS — E041 Nontoxic single thyroid nodule: Secondary | ICD-10-CM

## 2020-04-09 DIAGNOSIS — R609 Edema, unspecified: Secondary | ICD-10-CM

## 2020-04-09 DIAGNOSIS — E1122 Type 2 diabetes mellitus with diabetic chronic kidney disease: Secondary | ICD-10-CM

## 2020-04-09 DIAGNOSIS — Z89412 Acquired absence of left great toe: Secondary | ICD-10-CM

## 2020-04-09 DIAGNOSIS — E11622 Type 2 diabetes mellitus with other skin ulcer: Secondary | ICD-10-CM

## 2020-04-09 LAB — BPAM RBC
Blood Product Expiration Date: 202104152359
Blood Product Expiration Date: 202105132359
ISSUE DATE / TIME: 202104081428
Unit Type and Rh: 5100
Unit Type and Rh: 5100

## 2020-04-09 LAB — COMPREHENSIVE METABOLIC PANEL
ALT: 18 U/L (ref 0–44)
AST: 25 U/L (ref 15–41)
Albumin: 2.4 g/dL — ABNORMAL LOW (ref 3.5–5.0)
Alkaline Phosphatase: 62 U/L (ref 38–126)
Anion gap: 16 — ABNORMAL HIGH (ref 5–15)
BUN: 43 mg/dL — ABNORMAL HIGH (ref 8–23)
CO2: 24 mmol/L (ref 22–32)
Calcium: 8.2 mg/dL — ABNORMAL LOW (ref 8.9–10.3)
Chloride: 97 mmol/L — ABNORMAL LOW (ref 98–111)
Creatinine, Ser: 6.71 mg/dL — ABNORMAL HIGH (ref 0.44–1.00)
GFR calc Af Amer: 7 mL/min — ABNORMAL LOW (ref >60)
GFR calc non Af Amer: 6 mL/min — ABNORMAL LOW (ref >60)
Glucose, Bld: 103 mg/dL — ABNORMAL HIGH (ref 70–99)
Potassium: 4 mmol/L (ref 3.5–5.1)
Sodium: 137 mmol/L (ref 135–145)
Total Bilirubin: 0.3 mg/dL (ref 0.3–1.2)
Total Protein: 6.2 g/dL — ABNORMAL LOW (ref 6.5–8.1)

## 2020-04-09 LAB — CULTURE, BLOOD (ROUTINE X 2)
Culture: NO GROWTH
Culture: NO GROWTH
Special Requests: ADEQUATE
Special Requests: ADEQUATE

## 2020-04-09 LAB — TYPE AND SCREEN
ABO/RH(D): O POS
Antibody Screen: NEGATIVE
Unit division: 0
Unit division: 0

## 2020-04-09 LAB — CBC WITH DIFFERENTIAL/PLATELET
Abs Immature Granulocytes: 0.04 10*3/uL (ref 0.00–0.07)
Basophils Absolute: 0.1 10*3/uL (ref 0.0–0.1)
Basophils Relative: 1 %
Eosinophils Absolute: 0.1 10*3/uL (ref 0.0–0.5)
Eosinophils Relative: 2 %
HCT: 25.3 % — ABNORMAL LOW (ref 36.0–46.0)
Hemoglobin: 7.7 g/dL — ABNORMAL LOW (ref 12.0–15.0)
Immature Granulocytes: 1 %
Lymphocytes Relative: 11 %
Lymphs Abs: 1 10*3/uL (ref 0.7–4.0)
MCH: 27.4 pg (ref 26.0–34.0)
MCHC: 30.4 g/dL (ref 30.0–36.0)
MCV: 90 fL (ref 80.0–100.0)
Monocytes Absolute: 1.3 10*3/uL — ABNORMAL HIGH (ref 0.1–1.0)
Monocytes Relative: 16 %
Neutro Abs: 6 10*3/uL (ref 1.7–7.7)
Neutrophils Relative %: 69 %
Platelets: 269 10*3/uL (ref 150–400)
RBC: 2.81 MIL/uL — ABNORMAL LOW (ref 3.87–5.11)
RDW: 17.8 % — ABNORMAL HIGH (ref 11.5–15.5)
WBC: 8.5 10*3/uL (ref 4.0–10.5)
nRBC: 0 % (ref 0.0–0.2)

## 2020-04-09 LAB — GLUCOSE, CAPILLARY
Glucose-Capillary: 88 mg/dL (ref 70–99)
Glucose-Capillary: 95 mg/dL (ref 70–99)
Glucose-Capillary: 163 mg/dL — ABNORMAL HIGH (ref 70–99)
Glucose-Capillary: 190 mg/dL — ABNORMAL HIGH (ref 70–99)
Glucose-Capillary: 174 mg/dL — ABNORMAL HIGH (ref 70–99)

## 2020-04-09 LAB — CORTISOL: Cortisol, Plasma: 18.9 ug/dL

## 2020-04-09 LAB — PROTIME-INR
INR: 1.3 — ABNORMAL HIGH (ref 0.8–1.2)
Prothrombin Time: 16 seconds — ABNORMAL HIGH (ref 11.4–15.2)

## 2020-04-09 LAB — PROCALCITONIN: Procalcitonin: 0.41 ng/mL

## 2020-04-09 LAB — FIBRINOGEN: Fibrinogen: 725 mg/dL — ABNORMAL HIGH (ref 210–475)

## 2020-04-09 LAB — LACTIC ACID, PLASMA
Lactic Acid, Venous: 1.2 mmol/L (ref 0.5–1.9)
Lactic Acid, Venous: 1.4 mmol/L (ref 0.5–1.9)

## 2020-04-09 LAB — APTT: aPTT: 31 seconds (ref 24–36)

## 2020-04-09 MED ORDER — CEFAZOLIN SODIUM-DEXTROSE 1-4 GM/50ML-% IV SOLN
1.0000 g | INTRAVENOUS | Status: DC
  Administered 2020-04-09 – 2020-04-11 (×3): 1 g via INTRAVENOUS
  Filled 2020-04-09 (×3): qty 50

## 2020-04-09 NOTE — Progress Notes (Addendum)
[facility] Progress Note   Subjective: Discharge orders noted. Excited to be going home. No C/Os. Denies further bleeding episodes.     Objective Vitals:   04/08/20 2117 04/09/20 0147 04/09/20 0202 04/09/20 0445  BP: 120/61 (!) 100/58  (!) 112/44  Pulse: 83 72  92  Resp: 20 18  18   Temp: (!) 101.4 F (38.6 C) (!) 102.7 F (39.3 C) (!) 102.3 F (39.1 C) 99.3 F (37.4 C)  TempSrc: Oral Oral  Oral  SpO2: 98% 100%  100%  Weight:      Height:       Physical Exam General: Pleasant obese older female in NAD Heart: S1,S2 No M/R/G Lungs: CTAB Abdomen: obese, NT Extremities: No LE edema. Wound L foot open to air-drsg fell off.  Dialysis Access: L AVF + bruit RIJ TDC drsg CDI.    Additional Objective Labs: Basic Metabolic Panel: Recent Labs  Lab 04/05/20 0817 04/05/20 0817 04/06/20 0715 04/08/20 1009 04/09/20 0744  NA 138   < > 134* 133* 137  K 4.3   < > 4.5 3.9 4.0  CL 94*   < > 90* 94* 97*  CO2 27   < > 25 23 24   GLUCOSE 128*   < > 66* 138* 103*  BUN 60*   < > 69* 48* 43*  CREATININE 8.04*   < > 9.14* 7.19* 6.71*  CALCIUM 8.5*   < > 8.3* 8.0* 8.2*  PHOS 7.1*  --  7.1* 5.7*  --    < > = values in this interval not displayed.   Liver Function Tests: Recent Labs  Lab 04/06/20 0715 04/08/20 1009 04/09/20 0744  AST  --   --  25  ALT  --   --  18  ALKPHOS  --   --  62  BILITOT  --   --  0.3  PROT  --   --  6.2*  ALBUMIN 2.7* 2.3* 2.4*   No results for input(s): LIPASE, AMYLASE in the last 168 hours. CBC: Recent Labs  Lab 04/03/20 1630 04/04/20 0309 04/05/20 1842 04/05/20 1842 04/06/20 0715 04/06/20 0715 04/06/20 2319 04/08/20 1009 04/09/20 0744  WBC 8.9   < > 10.1   < > 9.6   < > 10.8* 7.7 8.5  NEUTROABS 7.3  --   --   --   --   --   --   --  6.0  HGB 6.8*   < > 8.0*   < > 7.5*   < > 8.6* 7.4* 7.7*  HCT 22.4*   < > 26.0*   < > 24.1*   < > 27.3* 23.5* 25.3*  MCV 91.4   < > 91.5  --  90.6  --  89.8 91.4 90.0  PLT 269   < > 296   < >  289   < > 261 251 269   < > = values in this interval not displayed.   Blood Culture    Component Value Date/Time   SDES  04/04/2020 1720    BLOOD WRIST RIGHT Performed at [facility], [place] [address] [place], [place] [address]    SDES  04/04/2020 1720    BLOOD RIGHT HAND Performed at [facility], [place] [address], [place], [place] [address]    SPECREQUEST  04/04/2020 1720    BOTTLES DRAWN AEROBIC AND ANAEROBIC Blood Culture adequate volume Performed at [place] [address], [place], [place] [address]  SPECREQUEST  04/04/2020 1720    BOTTLES DRAWN AEROBIC AND ANAEROBIC Blood Culture adequate volume Performed at [place] [address] [place], [place] [address]    CULT  04/04/2020 1720    NO GROWTH 5 DAYS Performed at [facility], [place] [address] [place], [place] [address]    CULT  04/04/2020 1720    NO GROWTH 5 DAYS Performed at [place] [address] [place], [place] [address]    REPTSTATUS 04/09/2020 FINAL 04/04/2020 1720   REPTSTATUS 04/09/2020 FINAL 04/04/2020 1720    Cardiac Enzymes: No results for input(s): CKTOTAL, CKMB, CKMBINDEX, TROPONINI in the last 168 hours. CBG: Recent Labs  Lab 04/08/20 0723 04/08/20 1155 04/08/20 1744 04/08/20 2211 04/09/20 0755  GLUCAP 132* 134* 226* 110* 95   Iron Studies: No results for input(s): IRON, TIBC, TRANSFERRIN, FERRITIN in the last 72 hours. @lablastinr3 @ Studies/Results: No results found. Medications:  . sodium chloride   Intravenous Once  . allopurinol  100 mg Oral Daily  . atorvastatin  40 mg Oral q1800  . buPROPion  150 mg Oral BID  . cephALEXin  500 mg Oral Q12H  . Chlorhexidine Gluconate Cloth  6 each Topical Daily  . Chlorhexidine Gluconate Cloth  6 each Topical Q0600  . ferrous sulfate  325 mg Oral BID WC  . gabapentin  300 mg Oral QHS  . insulin aspart  0-5 Units Subcutaneous QHS  . insulin  aspart  0-9 Units Subcutaneous TID WC  . insulin aspart  2 Units Subcutaneous TID WC  . latanoprost  1 drop Both Eyes QHS  . linaclotide  72 mcg Oral QAC breakfast  . loratadine  10 mg Oral Daily  . metoprolol tartrate  25 mg Oral BID  . pantoprazole  40 mg Oral Daily  . polyethylene glycol  17 g Oral BID  . senna-docusate  1 tablet Oral BID  . sucroferric oxyhydroxide  1,000 mg Oral TID WC     Dialysis Orders: [facility] TTS  4h15min 400/800 118kg 2K/2.5Ca RIJ TDC  -No heparin -Venofer 100 mg IV q HD until 4/17 -Mircera 225 mcg IV q 2 weeks (last 4/1) -Sensipar 30 mg PO TIW -Calcitriol 2.52mcg PO TIW  Assessment/Plan: 1. Recurrent GI bleeding. Hx AVMs. GI consulted .EGD = Duoden AVM "nonbld " , and per GI =pt refuses Coloncsopy Prep x2 ,Gi signed off now  2. ABLA/Anemia CKD - HGB 6.8 on adm. Hgb 7.7 04/09/20. S/P 2 units PRBCs. Recent OP ESA dose. Follow HGB. BC NGTD, WBC 8.5. Continue Fe load. Stop oral iron in HD patient particularly since on ferric binders.   3. Fever on admission  And low grade in hosp. 100.4  This am  Covid negative. CXR clear. Blood cultures neg to date. Non-healing foot wound following with [facility]. Per primarystarted Keflex po for Left LE cellulitis presumed with HO PVD sp L GRT Toe amp 02/09. Has started using AVF, have TDC removed ASAP as OP  4. ESRD -HD TTS.Next HD tomorrow at OP center. Continue to use AVF. Send order for new AVF cannulation protocol, remove TDC when successfully using 15g needles.  5. Hypertension/volume - BP low/stable. UF to EDW as tolerated 6. Metabolic bone disease -Continue binders/Sensipar/Calcitriol for now 7. Nutrition -Renal diet/prot supp  Disposition: Home today.   Rita H. Brown NP-C 04/09/2020, 8:47 AM  [employer] [phone]  I have seen and examined this patient and agree with plan and assessment in the above note  with renal recommendations/intervention highlighted.  Drainage for left foot with  presumed cellulitis.  Placed on keflex but could arrange for IV ancef as an outpatient with HD.  Plan for HD tomorrow to keep on her schedule.  Governor Rooks Aramis Weil,MD 04/09/2020 2:31 PM

## 2020-04-09 NOTE — Consult Note (Signed)
[facility]    Date of Admission:  04/03/2020     Total days of antibiotics 3               Reason for Consult: Fever    Referring Provider: Posey Pronto  Primary Care Provider: [facility]   ASSESSMENT:  [name] [name] is a 69 y/o female admitted with anemia with concern for GI bleeding with course complicated by the development of fever. Hemoglobin appears to be stable.  Blood culture from 4/7 without growth with repeat cultures drawn today. Her dialysis catheter appears without evidence of infection although certainly may be a possible source. Would recommend removal as soon as able. I have concern about her left lower leg given the drainage of her wound, odor and warmth. Does not appear to be cellulitis. With no history of MRSA, Keflex should cover most of the expected organisms. Previous MRI without evidence of osteomyelitis. May need to consider additional imaging given pain she is having increased inflammation. Will continue Keflex for and monitor fever curve and repeat blood cultures.   PLAN:  1. Continue current dose of Keflex.  2. Obtain x-ray left foot to evaluate for osteomyelitis. 3. Remove temporary dialysis catheter when able.  4. Continue wound care. Consider vascular surgery evaluation pending x-ray results.  5. Anemia management per primary team.    Active Problems:   Essential hypertension   Type 2 diabetes mellitus with chronic kidney disease on chronic dialysis, with long-term current use of insulin (HCC)   Chronic combined systolic and diastolic CHF (congestive heart failure) ([place])   ESRD on dialysis ([place])   Symptomatic anemia   GI bleed   . sodium chloride   Intravenous Once  . allopurinol  100 mg Oral Daily  . atorvastatin  40 mg Oral q1800  . buPROPion  150 mg Oral BID  . cephALEXin  500 mg Oral Q12H  . Chlorhexidine Gluconate Cloth  6 each Topical Daily  . Chlorhexidine Gluconate Cloth  6 each Topical  Q0600  . gabapentin  300 mg Oral QHS  . insulin aspart  0-5 Units Subcutaneous QHS  . insulin aspart  0-9 Units Subcutaneous TID WC  . insulin aspart  2 Units Subcutaneous TID WC  . latanoprost  1 drop Both Eyes QHS  . linaclotide  72 mcg Oral QAC breakfast  . loratadine  10 mg Oral Daily  . metoprolol tartrate  25 mg Oral BID  . pantoprazole  40 mg Oral Daily  . polyethylene glycol  17 g Oral BID  . senna-docusate  1 tablet Oral BID  . sucroferric oxyhydroxide  1,000 mg Oral TID WC     HPI: [name] [name] is a 69 y.o. female chronic systolic/diastolic heart failure, substance abuse (Cocaine), CAD s/p CABG x 3 (2013), Type 2 diabetes, end stage renal disease on dialysis, peripheral vascular disease, CVA, and thyroid nodule (FNA showed follicular cells without definite neoplasam) admitted from her dialysis center with weakness and anemia.   In the ED had hemoglobin level of 6.8. Chest x-ray with cardiomegaly with mild vascular congestion. There was no apparent source of active bleeding and was transfused 1 unit of PRBC. Per GI note anemia likely related to AVM in combination with CKD. Endoscopy performed without significant source of bleeding and [name] has been refusing prep for colonoscopy.   [name] has been having intermittent fevers 100.3 up to 102.1 since admission. No significant leukocytosis and  blood culture from 4/7 finalized without growth.  A new blood culture was drawn today in the setting of 102.9 fever. There is concern for presumed cellulitis of the left leg with nonhealing left leg ulcer. Currently on Day 3 of Keflex. ID has been asked to evaluate.   [name] has had intermittent fevers for the last several days and continues to have pain in her left foot following left first ray amputation about 3 weeks ago. She has been performing wound care herself but the pain has worsening. Has had a total of 3 falls since her surgery. Has not been taking any antibiotics  recently.    Review of Systems: Review of Systems  Constitutional: Positive for fever. Negative for chills and weight loss.  Respiratory: Negative for cough, shortness of breath and wheezing.   Cardiovascular: Negative for chest pain and leg swelling.  Gastrointestinal: Negative for abdominal pain, constipation, diarrhea, nausea and vomiting.  Skin: Negative for rash.  Neurological: Positive for weakness.     Past Medical History:  Diagnosis Date  . Abscess   . Acute blood loss anemia 08/17/2019  . Acute pulmonary edema (HCC)   . Acute respiratory failure ([place]) 10/18/2014  . Acute respiratory failure with hypoxia and hypercapnia ([place]) 06/01/2019  . AMS (altered mental status) 01/01/2020  . Anemia 08/2016  . Angiodysplasia of colon   . Arthritis of left shoulder region 03/23/2013  . Bleeding gastrointestinal   . Cardiomegaly 05/2019  . Chest pain 04/17/2016  . Chronic combined systolic and diastolic CHF (congestive heart failure) (HCC)    a. EF 40-45%, mild LVH, mid apicalanteroseptal and apical HK.  . CKD (chronic kidney disease), stage III   . Cocaine abuse ([place])    crack cocaine heavily until 2008 then sporadic use since then  . Coronary artery disease    a. 06/2012 NSTEMI/CABG x 3 (LIMA->LAD, VG->OM2, VG->LCX);  b. 04/2015 MV: EF<30%, mid ant, apicalanterior, apical infarct;  c. 04/2015 Cath: LM nl, LAD 90p, LCX 33m, OM1 min irregs, RCA mild dzs, LIMA->LAD nl w/ dist LAD dzs, VG->OM2 nl, VG->LCX nl-->Med Rx.  . CVA (cerebral infarction)    a. right internal capsule stroke in 12/2006  . Demand ischemia ([place])   . Diabetes mellitus    diagnosed in 2008  . Elevated troponin 04/27/2019  . Essential hypertension   . Glaucoma   . Gout   . Heme positive stool   . HFrEF (heart failure with reduced ejection fraction) ([place])   . Hyperlipidemia   . Hyperparathyroidism, secondary renal ([place])   . Hypertensive crisis 06/02/2019  . Left-sided sensory deficit present   . Lobar pneumonia ([place])  04/27/2019  . Obesity, morbid ([place])   . Pneumonia   . Pulmonary edema 05/2019  . PVD (peripheral vascular disease) ([place])    a. 06/2012 ABI's: R - 0.73, L - 0.71.  Marland Kitchen Renal mass, right   . Sepsis ([place]) 04/27/2019  . Shortness of breath dyspnea   . Stroke ([place])   . Thrombocytosis ([place]) 04/17/2016  . Thyroid nodule    FNA in 1962 showed follicular cells but not definate neoplasm  . Tobacco abuse   . Trichomoniasis     Social History   Tobacco Use  . Smoking status: Current Some Day Smoker    Packs/day: 0.25    Years: 50.00    Pack years: 12.50    Types: Cigarettes  . Smokeless tobacco: Never Used  Substance Use Topics  . Alcohol use: No  Alcohol/week: 0.0 standard drinks  . Drug use: Yes    Types: Cocaine    Comment: pt denies at current time    Family History  Problem Relation Age of Onset  . Diabetes Mother   . Hypertension Mother   . Cancer Mother   . Hyperlipidemia Father   . Hypertension Father   . Kidney disease Father   . Gout Father   . Cerebrovascular Accident Father   . Other Other        no known family CAD    Allergies  Allergen Reactions  . Naproxen Rash    OBJECTIVE: Blood pressure (!) 112/44, pulse 92, temperature 99.3 F (37.4 C), temperature source Oral, resp. rate 18, height 5\' 8"  (1.727 m), weight 122.7 kg, SpO2 100 %.  Physical Exam Constitutional:      General: She is not in acute distress.    Appearance: She is well-developed. She is obese.  Cardiovascular:     Rate and Rhythm: Normal rate and regular rhythm.     Heart sounds: Normal heart sounds.     Comments: Fistula in left arm is clean and dry. Bruit and thrill present. TDC catheter in right chest is clean and dry and without evidence of infection.  Pulmonary:     Effort: Pulmonary effort is normal.     Breath sounds: Normal breath sounds.  Skin:    General: Skin is warm and dry.  Neurological:     Mental Status: She is alert and oriented to person, place, and time.   Psychiatric:        Behavior: Behavior normal.        Thought Content: Thought content normal.        Judgment: Judgment normal.             Lab Results Lab Results  Component Value Date   WBC 8.5 04/09/2020   HGB 7.7 (L) 04/09/2020   HCT 25.3 (L) 04/09/2020   MCV 90.0 04/09/2020   PLT 269 04/09/2020    Lab Results  Component Value Date   CREATININE 6.71 (H) 04/09/2020   BUN 43 (H) 04/09/2020   NA 137 04/09/2020   K 4.0 04/09/2020   CL 97 (L) 04/09/2020   CO2 24 04/09/2020    Lab Results  Component Value Date   ALT 18 04/09/2020   AST 25 04/09/2020   ALKPHOS 62 04/09/2020   BILITOT 0.3 04/09/2020     Microbiology: Recent Results (from the past 240 hour(s))  SARS CORONAVIRUS 2 (TAT 6-24 HRS) Nasopharyngeal Nasopharyngeal Swab     Status: None   Collection Time: 04/03/20  7:00 PM   Specimen: Nasopharyngeal Swab  Result Value Ref Range Status   SARS Coronavirus 2 NEGATIVE NEGATIVE Final    Comment: (NOTE) SARS-CoV-2 target nucleic acids are NOT DETECTED. The SARS-CoV-2 RNA is generally detectable in upper and lower respiratory specimens during the acute phase of infection. Negative results do not preclude SARS-CoV-2 infection, do not rule out co-infections with other pathogens, and should not be used as the sole basis for treatment or other patient management decisions. Negative results must be combined with clinical observations, patient history, and epidemiological information. The expected result is Negative. Fact Sheet for Patients: SugarRoll.be Fact Sheet for Healthcare Providers: https://www.woods-mathews.com/ This test is not yet approved or cleared by the [place] FDA and  has been authorized for detection and/or diagnosis of SARS-CoV-2 by FDA under an Emergency Use Authorization (EUA). This EUA will remain  in effect (  meaning this test can be used) for the duration of the COVID-19 declaration under  Section 56 4(b)(1) of the Act, 21 U.S.C. section 360bbb-3(b)(1), unless the authorization is terminated or revoked sooner. Performed at [facility], [place] [address], [place], [place] [address]   Culture, blood (Routine X 2) w Reflex to ID Panel     Status: None   Collection Time: 04/04/20  5:20 PM   Specimen: BLOOD  Result Value Ref Range Status   Specimen Description   Final    BLOOD WRIST RIGHT Performed at [facility] [address], [place], [place] [address]    Special Requests   Final    BOTTLES DRAWN AEROBIC AND ANAEROBIC Blood Culture adequate volume Performed at [facility] [address] [place], [place] [address]    Culture   Final    NO GROWTH 5 DAYS Performed at [facility], [place] [address], [place], [place] [address]    Report Status 04/09/2020 FINAL  Final  Culture, blood (Routine X 2) w Reflex to ID Panel     Status: None   Collection Time: 04/04/20  5:20 PM   Specimen: BLOOD RIGHT HAND  Result Value Ref Range Status   Specimen Description   Final    BLOOD RIGHT HAND Performed at [facility] [address], [place], [place] [address]    Special Requests   Final    BOTTLES DRAWN AEROBIC AND ANAEROBIC Blood Culture adequate volume Performed at [facility] [address] [place], [place] [address]    Culture   Final    NO GROWTH 5 DAYS Performed at [facility], [place] [address], [place], [place] [address]    Report Status 04/09/2020 FINAL  Final  Surgical pcr screen     Status: None   Collection Time: 04/05/20 11:54 AM   Specimen: Nasal Mucosa; Nasal Swab  Result Value Ref Range Status   MRSA, PCR NEGATIVE NEGATIVE Final   Staphylococcus aureus NEGATIVE NEGATIVE Final    Comment: (NOTE) The Xpert SA Assay (FDA approved for NASAL specimens in patients 9 years of age and older), is one component of a comprehensive surveillance program. It is not intended  to diagnose infection nor to guide or monitor treatment. Performed at [facility], [place] [address] [place],  [address]      Terri Piedra, [place] [facility] [place] [facility]  04/09/2020  11:09 AM

## 2020-04-09 NOTE — TOC Progression Note (Signed)
Transition of Care [facility]) - Progression Note    Patient Details  Name: [name] [name] MRN: [id number] Date of Birth: 1951-01-22  Transition of Care [facility]) CM/SW [place], [place] Phone Number: 04/09/2020, 2:04 PM  Clinical Narrative:    Pt from [facility], ongoing medical work up. Pending PT/OT. Plan is for pt to return to SNF when stable. Will send evaluations through to SNF when complete.   Expected Discharge Plan: [place] Barriers to Discharge: Continued Medical Work up  Expected Discharge Plan and Services Expected Discharge Plan: [place] In-house Referral: Clinical Social Work Discharge Planning Services: CM Consult Post Acute Care Choice: Resumption of Svcs/PTA Provider, [place] arrangements for the past 2 months: Single Family Home Expected Discharge Date: 04/04/20               Readmission Risk Interventions Readmission Risk Prevention Plan 04/05/2020 12/12/2019 10/08/2019  Transportation Screening Complete Complete -  Medication Review Press photographer) Complete Complete -  PCP or Specialist appointment within 3-5 days of discharge Not Complete Complete -  PCP/Specialist Appt Not Complete comments facility resident - -  [place] or Home Care Consult Complete - -  SW Recovery Care/Counseling Consult Complete - -  Palliative Care Screening Not Applicable - Not Applicable  Skilled Nursing Facility Complete - -  Some recent data might be hidden

## 2020-04-09 NOTE — Progress Notes (Signed)
[facility] Progress Note  Patient: [name] [name]    HUD:[id number]  DOA: 04/03/2020     Date of Service: the patient was seen and examined on 04/09/2020  Chief Complaint  Patient presents with  . Abnormal Lab   Brief hospital course: Past medical history of ESRD on HD, CAD SP CABG, combined CHF, type II DM, CVA.  Recent anemia.  Patient presented with complaints of abnormal hemoglobin from hemodialysis.  She was transfused PRBC.  After that she started having Sirs-like reaction.  Patient was transferred to [facility] for hemodialysis need and continuous observation for her sepsis-like picture. Patient continues to have low hemoglobin and therefore GI was consulted.  Patient has refused bowel prep x2 so far. GI has signed off. 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 Currently further plan is monitor for fever reoccurrence and bleeding.  Assessment and Plan: 1.  Lower GI bleed SP EGD, duodenal AVM treated with APC Patient reports hematochezia. SP 2 PRBC. Monitor H&H. Patient has refused colonoscopy prep x2. [facility] was consulted appreciate assistance. SP upper GI endoscopy. Not a candidate for capsule endoscopy given no bowel prep. Remains at risk for further GI bleed. Prior colonoscopy last February was suggestive of an AVM treated with clips. Hemoglobin trending down from 8.3-7.7. Patient does not report any bleeding so far.  2.  ESRD on HD Admitted to [facility] for ESRD. Continue HD appreciate nephrology input.  3.  History of combined chronic CHF Currently euvolemic.  Monitor.  4.  Type 2 diabetes mellitus with renal complication.  Uncontrolled with hyperglycemia Currently on sliding scale insulin.  5.  Essential hypertension. Blood pressure stable.  Monitor.  6.  Osteomyelitis of the foot. Nonhealing left leg ulcer. PVD. Patient has recurrent fever. Blood cultures negative. ID consulted. X-ray shows evidence of osteomyelitis of the  foot. We will start her on IV cefazolin.  Maintain with HD.  Appreciate ID assistance.  7. Morbid obesity. Body mass index is 41.13 kg/m.   Diet: Renal diet DVT Prophylaxis: SCD, pharmacological prophylaxis contraindicated due to Due to GI bleed   Advance goals of care discussion: Full code  Family Communication: no family was present at bedside, at the time of interview.   Disposition:  Pt is from home, admitted with GI bleed, still has anemia requiring transfusion as well as intermittent fever, which precludes a safe discharge. Discharge to home, when medically stable.  Subjective: No nausea no vomiting no diarrhea.  Had a bowel movement without any blood.  Had another episode of fever early this morning.  Physical Exam: General:  alert oriented to time, place, and person.  Appear in mild distress, affect appropriate Eyes: PERRL ENT: Oral Mucosa Clear, moist  Neck: no JVD,  Cardiovascular: S1 and S2 Present, no Murmur,  Respiratory: good respiratory effort, Bilateral Air entry equal and Decreased, no Crackles, no wheezes Abdomen: Bowel Sound present, Soft and no tenderness,  Skin: no rash Extremities: no Pedal edema, no calf tenderness Neurologic: without any new focal findings  Gait not checked due to patient safety concerns  Vitals:   04/09/20 0147 04/09/20 0202 04/09/20 0445 04/09/20 1452  BP: (!) 100/58  (!) 112/44 109/75  Pulse: 72  92 70  Resp: 18  18 19   Temp: (!) 102.7 F (39.3 C) (!) 102.3 F (39.1 C) 99.3 F (37.4 C) 97.7 F (36.5 C)  TempSrc: Oral  Oral Oral  SpO2: 100%  100% (!) 82%  Weight:  Height:        Intake/Output Summary (Last 24 hours) at 04/09/2020 2014 Last data filed at 04/09/2020 1016 Gross per 24 hour  Intake 450 ml  Output --  Net 450 ml   Filed Weights   04/07/20 0734 04/08/20 0830 04/08/20 1036  Weight: 114.3 kg 124.7 kg 122.7 kg    Data Reviewed: I have personally reviewed and interpreted daily labs, tele strips,  imagings as discussed above. I reviewed all nursing notes, pharmacy notes, vitals, pertinent old records I have discussed plan of care as described above with RN and patient/family.  CBC: Recent Labs  Lab 04/03/20 1630 04/04/20 0309 04/05/20 1842 04/06/20 0715 04/06/20 2319 04/08/20 1009 04/09/20 0744  WBC 8.9   < > 10.1 9.6 10.8* 7.7 8.5  NEUTROABS 7.3  --   --   --   --   --  6.0  HGB 6.8*   < > 8.0* 7.5* 8.6* 7.4* 7.7*  HCT 22.4*   < > 26.0* 24.1* 27.3* 23.5* 25.3*  MCV 91.4   < > 91.5 90.6 89.8 91.4 90.0  PLT 269   < > 296 289 261 251 269   < > = values in this interval not displayed.   Basic Metabolic Panel: Recent Labs  Lab 04/03/20 1630 04/05/20 0817 04/06/20 0715 04/08/20 1009 04/09/20 0744  NA 137 138 134* 133* 137  K 3.9 4.3 4.5 3.9 4.0  CL 94* 94* 90* 94* 97*  CO2 28 27 25 23 24   GLUCOSE 149* 128* 66* 138* 103*  BUN 36* 60* 69* 48* 43*  CREATININE 4.87* 8.04* 9.14* 7.19* 6.71*  CALCIUM 8.1* 8.5* 8.3* 8.0* 8.2*  PHOS  --  7.1* 7.1* 5.7*  --     Studies: DG ABD ACUTE 2+V W 1V CHEST  Result Date: 04/09/2020 CLINICAL DATA:  Fever with low hemoglobin. EXAM: DG ABDOMEN ACUTE W/ 1V CHEST COMPARISON:  Abdominal film 06/26/2018 and chest x-ray 02/24/2020 FINDINGS: Right IJ central venous catheter unchanged with tip over the SVC. Lungs are adequately inflated with minimal hazy prominence of the perihilar vessels unchanged and likely due to mild chronic vascular congestion. No lobar consolidation or effusion. Mild stable cardiomegaly. Remainder the chest is unchanged. There are several air-filled large and small bowel loops. A few air-filled small bowel loops at the upper limits of normal in diameter over the left abdomen. No evidence of air-fluid levels or free peritoneal air. Remainder of the exam is unchanged. IMPRESSION: Nonspecific, nonobstructive bowel gas pattern with a few air-filled mildly prominent, but nondilated small bowel loops in the left abdomen. Stable  mild cardiomegaly with suggestion of minimal chronic vascular congestion. Electronically Signed   By: Marin Olp M.D.   On: 04/09/2020 09:57   DG Foot Complete Left  Result Date: 04/09/2020 CLINICAL DATA:  Foot pain and swelling EXAM: LEFT FOOT - COMPLETE 3+ VIEW COMPARISON:  02/01/2020 MRI, 01/31/2020 plain film FINDINGS: There is been interval amputation of the first toe. Persistent soft tissue wound is seen. There is lucency identified in the head of the first metatarsal suspicious for recurrent osteomyelitis. No other fracture or dislocation is seen. No other soft tissue abnormality is noted. IMPRESSION: Changes suspicious for osteomyelitis in the head of the first metatarsal. Overlying soft tissue wound is noted. Electronically Signed   By: Inez Catalina M.D.   On: 04/09/2020 19:40   VAS [place] LOWER EXTREMITY VENOUS (DVT)  Result Date: 04/09/2020  Lower Venous DVTStudy Indications: Edema, and fever.  Comparison Study:  04/28/19 previous Performing Technologist: Abram Sander RVS  Examination Guidelines: A complete evaluation includes B-mode imaging, spectral Doppler, color Doppler, and power Doppler as needed of all accessible portions of each vessel. Bilateral testing is considered an integral part of a complete examination. Limited examinations for reoccurring indications may be performed as noted. The reflux portion of the exam is performed with the patient in reverse Trendelenburg.  +---------+---------------+---------+-----------+----------+--------------+ RIGHT    CompressibilityPhasicitySpontaneityPropertiesThrombus Aging +---------+---------------+---------+-----------+----------+--------------+ CFV      Full           Yes      Yes                                 +---------+---------------+---------+-----------+----------+--------------+ SFJ      Full                                                         +---------+---------------+---------+-----------+----------+--------------+ FV Prox  Full                                                        +---------+---------------+---------+-----------+----------+--------------+ FV Mid   Full                                                        +---------+---------------+---------+-----------+----------+--------------+ FV DistalFull                                                        +---------+---------------+---------+-----------+----------+--------------+ PFV      Full                                                        +---------+---------------+---------+-----------+----------+--------------+ POP      Full           Yes      Yes                                 +---------+---------------+---------+-----------+----------+--------------+ PTV      Full                                                        +---------+---------------+---------+-----------+----------+--------------+ PERO  Not visualized +---------+---------------+---------+-----------+----------+--------------+   +---------+---------------+---------+-----------+----------+--------------+ LEFT     CompressibilityPhasicitySpontaneityPropertiesThrombus Aging +---------+---------------+---------+-----------+----------+--------------+ CFV      Full           Yes      Yes                                 +---------+---------------+---------+-----------+----------+--------------+ SFJ      Full                                                        +---------+---------------+---------+-----------+----------+--------------+ FV Prox  Full                                                        +---------+---------------+---------+-----------+----------+--------------+ FV Mid   Full                                                         +---------+---------------+---------+-----------+----------+--------------+ FV DistalFull                                                        +---------+---------------+---------+-----------+----------+--------------+ PFV      Full                                                        +---------+---------------+---------+-----------+----------+--------------+ POP      Full           Yes      Yes                                 +---------+---------------+---------+-----------+----------+--------------+ PTV      Full                                                        +---------+---------------+---------+-----------+----------+--------------+ PERO                                                  Not visualized +---------+---------------+---------+-----------+----------+--------------+     Summary: BILATERAL: - No evidence of deep vein thrombosis seen in the lower extremities, bilaterally.   *See table(s) above for measurements and observations.    Preliminary     Scheduled Meds: . sodium chloride   Intravenous Once  . allopurinol  100  mg Oral Daily  . atorvastatin  40 mg Oral q1800  . buPROPion  150 mg Oral BID  . Chlorhexidine Gluconate Cloth  6 each Topical Daily  . Chlorhexidine Gluconate Cloth  6 each Topical Q0600  . gabapentin  300 mg Oral QHS  . insulin aspart  0-5 Units Subcutaneous QHS  . insulin aspart  0-9 Units Subcutaneous TID WC  . insulin aspart  2 Units Subcutaneous TID WC  . latanoprost  1 drop Both Eyes QHS  . linaclotide  72 mcg Oral QAC breakfast  . loratadine  10 mg Oral Daily  . metoprolol tartrate  25 mg Oral BID  . pantoprazole  40 mg Oral Daily  . polyethylene glycol  17 g Oral BID  . senna-docusate  1 tablet Oral BID  . sucroferric oxyhydroxide  1,000 mg Oral TID WC   Continuous Infusions: PRN Meds: acetaminophen, albuterol, mometasone-formoterol, nitroGLYCERIN, oxyCODONE  Time spent: 35 minutes  Author: Berle Mull,  MD [facility] 04/09/2020 8:14 PM  To reach On-call, see care teams to locate the attending and reach out to them via [email or web address]. If 7PM-7AM, please contact night-coverage If you still have difficulty reaching the attending provider, please page the [facility] (Director on Call) for [facility] on amion for assistance.

## 2020-04-09 NOTE — Progress Notes (Signed)
Pharmacy Antibiotic Note  [name] [name] is a 69 y.o. female admitted on 04/03/2020 with Osteomyelitis.  Pharmacy has been consulted for cefazolin dosing.  WBC 8.5, PCT 0.41, LA 1.4, Scr 6.71- hx ESRD, Tmax in 24 hours 101.4. Previous MRI neg, plan for Xray to see if OM developing per ID.   Plan: Start cefazolin 1 g IV every 24 hours Monitor HD schedule/tolerance, cx results, clinical pic, ID workup  Height: 5\' 8"  (172.7 cm) Weight: 122.7 kg (270 lb 8.1 oz) IBW/kg (Calculated) : 63.9  Temp (24hrs), Avg:100.7 F (38.2 C), Min:97.7 F (36.5 C), Max:102.7 F (39.3 C)  Recent Labs  Lab 04/03/20 1630 04/03/20 1630 04/05/20 0817 04/05/20 0817 04/05/20 1842 04/06/20 0715 04/06/20 2319 04/08/20 1009 04/09/20 0744 04/09/20 1034  WBC 8.9   < > 8.8   < > 10.1 9.6 10.8* 7.7 8.5  --   CREATININE 4.87*  --  8.04*  --   --  9.14*  --  7.19* 6.71*  --   LATICACIDVEN  --   --   --   --   --   --   --   --  1.2 1.4   < > = values in this interval not displayed.    Estimated Creatinine Clearance: 10.9 mL/min (A) (by C-G formula based on SCr of 6.71 mg/dL (H)).    Allergies  Allergen Reactions  . Naproxen Rash    Antimicrobials this admission: Cefazolin 4/12 >>  Cephalexin 4/10 >>   Dose adjustments this admission: N/A  Microbiology results: 4/7 BCx: ngtd 4/12 BCx: sent 4/12 Sputum: sent  4/8 MRSA PCR: neg  Thank you for allowing pharmacy to be a part of this patient's care.  Antonietta Jewel, PharmD, [place] Clinical Pharmacist  Phone: [phone] 04/09/2020 8:29 PM  Please check AMION for all [place] phone numbers After 10:00 PM, call [place] [phone]

## 2020-04-09 NOTE — Evaluation (Signed)
Physical Therapy Evaluation Patient Details Name: [name] [name] MRN: [id number] DOB: 07-Aug-1951 Today's Date: 04/09/2020   History of Present Illness  Patient is a 69 y/o female who presents with lower GI bleed due to duodenal AVM and fever, SIRS-like picture. 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  Clinical Impression  Patient presents with generalized weakness, LLE pain, impaired balance and impaired mobility s/p above. Pt was admitted from [facility] where she went after her toe amputation in February of 2021. She reports short distance walking with use of RW at rehab. Today, pt requires heavy Mod A to stand from EOB with cues for technique/momentum but limited standing tolerance today due to pain with WB through LLE. Tolerated lateral scoots along side bed with min guard assist for better positioning and Mod A to bring LEs into bed to return to supine. Deferred transfer to chair as echo tech wanting pt in bed for procedure. Would benefit from return to SNF to maximize independence and mobility prior to return home. Will follow.    Follow Up Recommendations SNF;Supervision/Assistance - 24 hour(return to [facility])    Equipment Recommendations  None recommended by PT    Recommendations for Other Services       Precautions / Restrictions Precautions Precautions: Fall Precaution Comments: left great toe amputation; reports no WB restrictions Restrictions Weight Bearing Restrictions: No      Mobility  Bed Mobility Overal bed mobility: Needs Assistance Bed Mobility: Sit to Supine       Sit to supine: Mod assist;HOB elevated   General bed mobility comments: assist to bring LEs into bed; able to help scooting self up in bed pushing through bil feet  Transfers Overall transfer level: Needs assistance Equipment used: Rolling walker (2 wheeled) Transfers: Sit to/from  Stand;Lateral/Scoot Transfers Sit to Stand: Mod assist        Lateral/Scoot Transfers: Min guard General transfer comment: Heavy Mod A to stand from EOB with use of momentum and cues for technique. Limited standing tolerance due to pain with WB through LLE. Deferred transfer to chair as echo present and requested to keep pt in bed. Able to laterally scoot along side bed x4 with Min guard assist,.  Ambulation/Gait             General Gait Details: Deferred as pt unable.  Stairs            Wheelchair Mobility    Modified Rankin (Stroke Patients Only)       Balance Overall balance assessment: Needs assistance Sitting-balance support: Feet supported;No upper extremity supported Sitting balance-Leahy Scale: Good Sitting balance - Comments: supervision for safety. Total A to donn sock   Standing balance support: During functional activity Standing balance-Leahy Scale: Poor Standing balance comment: Requires BUE support and external support, limited tolerance due to pain.                             Pertinent Vitals/Pain Pain Assessment: Faces Faces Pain Scale: Hurts even more Pain Location: LLE with weight bearing Pain Descriptors / Indicators: Grimacing;Sore Pain Intervention(s): Repositioned;Monitored during session;Limited activity within patient's tolerance    Home Living Family/patient expects to be discharged to:: Skilled nursing facility(Return to [facility]) Living Arrangements: Spouse/significant other Available Help at Discharge: Available 24 hours/day;Family Type of Home: Apartment Home Access: Stairs to enter Entrance Stairs-Rails: Can reach both  Entrance Stairs-Number of Steps: 13 (6+7) Home Layout: One level Home Equipment: Shower seat;Grab bars - tub/shower;Cane - quad;Walker - 2 wheels      Prior Function Level of Independence: Needs assistance   Gait / Transfers Assistance Needed: Reports she was doing minimal ambulation at [facility] since toe amputation.  ADL's / Homemaking Assistance Needed: Needing assist with ADLs.  Comments: States her spouse is getting a new apt on ground level and she plans to return there once rehab is over end of April.     Hand Dominance   Dominant Hand: Right    Extremity/Trunk Assessment   Upper Extremity Assessment Upper Extremity Assessment: Defer to OT evaluation    Lower Extremity Assessment Lower Extremity Assessment: Generalized weakness;LLE deficits/detail LLE Deficits / Details: Reports phantom pain "where my toe used to be." LLE Sensation: decreased light touch       Communication   Communication: No difficulties  Cognition Arousal/Alertness: Awake/alert Behavior During Therapy: WFL for tasks assessed/performed Overall Cognitive Status: No family/caregiver present to determine baseline cognitive functioning                                 General Comments: Inconsistencies with reported PLOF/present activity level. Pt reports she is walking in hospital room however requires significant assist to stand from EOB and cannot tolerate WB through LLE.      General Comments      Exercises     Assessment/Plan    PT Assessment Patient needs continued PT services  PT Problem List Decreased strength;Decreased mobility;Pain;Impaired sensation;Decreased balance;Decreased activity tolerance;Decreased skin integrity;Decreased cognition;Obesity       PT Treatment Interventions Therapeutic activities;Gait training;Therapeutic exercise;Patient/family education;Wheelchair mobility training;Balance training;Functional mobility training    PT Goals (Current goals can be found in the Care Plan section)  Acute Rehab PT Goals Patient Stated Goal: to return to rehab to get back to independence PT Goal Formulation: With patient Time For Goal Achievement: 04/23/20 Potential to Achieve Goals: Good    Frequency Min 2X/week   Barriers to discharge         Co-evaluation               AM-PAC PT "6 Clicks" Mobility  Outcome Measure Help needed turning from your back to your side while in a flat bed without using bedrails?: A Little Help needed moving from lying on your back to sitting on the side of a flat bed without using bedrails?: A Lot Help needed moving to and from a bed to a chair (including a wheelchair)?: A Lot Help needed standing up from a chair using your arms (e.g., wheelchair or bedside chair)?: A Lot Help needed to walk in hospital room?: A Lot Help needed climbing 3-5 steps with a railing? : Total 6 Click Score: 12    End of Session Equipment Utilized During Treatment: Gait belt Activity Tolerance: Patient limited by pain Patient left: in bed;with call bell/phone within reach;Other (comment)(echo tech in room) Nurse Communication: Mobility status PT Visit Diagnosis: Pain;Muscle weakness (generalized) (M62.81);Unsteadiness on feet (R26.81);Difficulty in walking, not elsewhere classified (R26.2) Pain - Right/Left: Left Pain - part of body: Ankle and joints of foot;Leg    Time: 1112-1127 PT Time Calculation (min) (ACUTE ONLY): 15 min   Charges:   PT Evaluation $PT Eval Moderate Complexity: 1 Mod          Marisa Severin, PT, DPT Acute Rehabilitation Services Pager [phone]  Office [place] 04/09/2020, 2:40 PM

## 2020-04-10 DIAGNOSIS — I739 Peripheral vascular disease, unspecified: Secondary | ICD-10-CM

## 2020-04-10 DIAGNOSIS — M86672 Other chronic osteomyelitis, left ankle and foot: Secondary | ICD-10-CM

## 2020-04-10 DIAGNOSIS — M86179 Other acute osteomyelitis, unspecified ankle and foot: Secondary | ICD-10-CM

## 2020-04-10 DIAGNOSIS — M869 Osteomyelitis, unspecified: Secondary | ICD-10-CM | POA: Diagnosis present

## 2020-04-10 LAB — GLUCOSE, CAPILLARY
Glucose-Capillary: 143 mg/dL — ABNORMAL HIGH (ref 70–99)
Glucose-Capillary: 106 mg/dL — ABNORMAL HIGH (ref 70–99)
Glucose-Capillary: 183 mg/dL — ABNORMAL HIGH (ref 70–99)
Glucose-Capillary: 166 mg/dL — ABNORMAL HIGH (ref 70–99)

## 2020-04-10 LAB — BASIC METABOLIC PANEL
Anion gap: 15 (ref 5–15)
BUN: 55 mg/dL — ABNORMAL HIGH (ref 8–23)
CO2: 25 mmol/L (ref 22–32)
Calcium: 8.4 mg/dL — ABNORMAL LOW (ref 8.9–10.3)
Chloride: 95 mmol/L — ABNORMAL LOW (ref 98–111)
Creatinine, Ser: 7.77 mg/dL — ABNORMAL HIGH (ref 0.44–1.00)
GFR calc Af Amer: 6 mL/min — ABNORMAL LOW (ref >60)
GFR calc non Af Amer: 5 mL/min — ABNORMAL LOW (ref >60)
Glucose, Bld: 109 mg/dL — ABNORMAL HIGH (ref 70–99)
Potassium: 4.4 mmol/L (ref 3.5–5.1)
Sodium: 135 mmol/L (ref 135–145)

## 2020-04-10 LAB — SURGICAL PCR SCREEN
MRSA, PCR: NEGATIVE
Staphylococcus aureus: NEGATIVE

## 2020-04-10 LAB — CBC
HCT: 24 % — ABNORMAL LOW (ref 36.0–46.0)
Hemoglobin: 7.4 g/dL — ABNORMAL LOW (ref 12.0–15.0)
MCH: 27.7 pg (ref 26.0–34.0)
MCHC: 30.8 g/dL (ref 30.0–36.0)
MCV: 89.9 fL (ref 80.0–100.0)
Platelets: 290 10*3/uL (ref 150–400)
RBC: 2.67 MIL/uL — ABNORMAL LOW (ref 3.87–5.11)
RDW: 18.4 % — ABNORMAL HIGH (ref 11.5–15.5)
WBC: 10 10*3/uL (ref 4.0–10.5)
nRBC: 0 % (ref 0.0–0.2)

## 2020-04-10 MED ORDER — SODIUM CHLORIDE 0.9 % IV SOLN: 100.0000 mL | INTRAVENOUS | Status: DC | PRN

## 2020-04-10 MED ORDER — LIDOCAINE HCL (PF) 1 % IJ SOLN: 5.0000 mL | INTRAMUSCULAR | Status: DC | PRN

## 2020-04-10 MED ORDER — HEPARIN SODIUM (PORCINE) 1000 UNIT/ML IJ SOLN
INTRAMUSCULAR | Status: AC
  Filled 2020-04-10: qty 4

## 2020-04-10 MED ORDER — ALTEPLASE 2 MG IJ SOLR: 2.0000 mg | Freq: Once | INTRAMUSCULAR | Status: DC | PRN

## 2020-04-10 MED ORDER — PENTAFLUOROPROP-TETRAFLUOROETH EX AERO: 1.0000 "application " | INHALATION_SPRAY | CUTANEOUS | Status: DC | PRN

## 2020-04-10 MED ORDER — LIDOCAINE-PRILOCAINE 2.5-2.5 % EX CREA: 1.0000 "application " | TOPICAL_CREAM | CUTANEOUS | Status: DC | PRN

## 2020-04-10 MED ORDER — HEPARIN SODIUM (PORCINE) 1000 UNIT/ML DIALYSIS: 1000.0000 [IU] | INTRAMUSCULAR | Status: DC | PRN

## 2020-04-10 MED ORDER — OXYCODONE HCL 5 MG PO TABS
ORAL_TABLET | ORAL | Status: AC
  Filled 2020-04-10: qty 1

## 2020-04-10 NOTE — Progress Notes (Signed)
[facility] Progress Note  Patient: [name] [name]    OXB:[id number]  DOA: 04/03/2020     Date of Service: the patient was seen and examined on 04/10/2020  Chief Complaint  Patient presents with  . Abnormal Lab   Brief hospital course: Past medical history of ESRD on HD, CAD SP CABG, combined CHF, type II DM, CVA.  Recent anemia.  Patient presented with complaints of abnormal hemoglobin from hemodialysis.  She was transfused PRBC.  After that she started having Sirs-like reaction.  Patient was transferred to [facility] for hemodialysis need and continuous observation for her sepsis-like picture. Patient continues to have low hemoglobin and therefore GI was consulted.  Patient has refused bowel prep x2 so far. GI has signed off. 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 Currently further plan is monitor for postop recovery after amputation for osteomyelitis and also monitor for H&H stability.  Assessment and Plan: 1.  Lower GI bleed SP EGD, duodenal AVM treated with APC Primary reason for patient's presentation.  Patient reports hematochezia. SP 2 PRBC. Monitor H&H. Patient has refused colonoscopy prep x2. [facility] was consulted appreciate assistance. SP upper GI endoscopy. Not a candidate for capsule endoscopy given no bowel prep. Remains at risk for further GI bleed. Prior colonoscopy last February was suggestive of an AVM treated with clips. Hemoglobin trending down but now stabilizing around 7.5's.  Monitor.  No active bleeding reported by the patient as well.  2.  ESRD on HD Admitted to [facility] for ESRD. Continue HD appreciate nephrology input.  3.  History of combined chronic CHF Currently euvolemic.  Monitor.  4.  Type 2 diabetes mellitus with renal complication.  Uncontrolled with hyperglycemia Currently on sliding scale insulin.  5.  Essential hypertension. Blood pressure stable.  Monitor.  6.  Osteomyelitis of the foot. Nonhealing left  leg ulcer. PVD. Patient has recurrent fever. Blood cultures negative. ID consulted. X-ray shows evidence of osteomyelitis of the foot. Vascular surgery consulted appreciate assistance pretension patient is scheduled for amputation tomorrow. Continue IV antibiotics with HD.  7. Morbid obesity. Body mass index is 40.66 kg/m.   Diet: Renal diet DVT Prophylaxis: SCD, pharmacological prophylaxis contraindicated due to Due to GI bleed   Advance goals of care discussion: Full code  Family Communication: no family was present at bedside, at the time of interview.   Disposition:  Pt is from home, admitted with GI bleed, still has anemia requiring transfusion as well as intermittent fever, which precludes a safe discharge. Discharge to home, when medically stable.  Subjective: No acute complaint no nausea no vomiting.  Had a bowel movement without any blood.  No fever no chills.  Physical Exam: General:  alert oriented to time, place, and person.  Appear in mild distress, affect appropriate Eyes: PERRL ENT: Oral Mucosa Clear, moist  Neck: no JVD,  Cardiovascular: S1 and S2 Present, no Murmur,  Respiratory: good respiratory effort, Bilateral Air entry equal and Decreased, no Crackles, no wheezes Abdomen: Bowel Sound present, Soft and no tenderness,  Skin: no rash Extremities: no Pedal edema, no calf tenderness Neurologic: without any new focal findings  Gait not checked due to patient safety concerns  Vitals:   04/10/20 1100 04/10/20 1130 04/10/20 1140 04/10/20 1219  BP: 134/66 (!) 119/50 (!) 120/55 140/90  Pulse: 82 74 73 87  Resp:   17 18  Temp:   97.9 F (36.6 C) 99.8 F (37.7 C)  TempSrc:   Oral Oral  SpO2:   98% 96%  Weight:   121.3 kg   Height:        Intake/Output Summary (Last 24 hours) at 04/10/2020 1818 Last data filed at 04/10/2020 1140 Gross per 24 hour  Intake 230 ml  Output 1500 ml  Net -1270 ml   Filed Weights   04/08/20 1036 04/10/20 0720 04/10/20 1140   Weight: 122.7 kg 122.5 kg 121.3 kg    Data Reviewed: I have personally reviewed and interpreted daily labs, tele strips, imagings as discussed above. I reviewed all nursing notes, pharmacy notes, vitals, pertinent old records I have discussed plan of care as described above with RN and patient/family.  CBC: Recent Labs  Lab 04/06/20 0715 04/06/20 2319 04/08/20 1009 04/09/20 0744 04/10/20 0243  WBC 9.6 10.8* 7.7 8.5 10.0  NEUTROABS  --   --   --  6.0  --   HGB 7.5* 8.6* 7.4* 7.7* 7.4*  HCT 24.1* 27.3* 23.5* 25.3* 24.0*  MCV 90.6 89.8 91.4 90.0 89.9  PLT 289 261 251 269 664   Basic Metabolic Panel: Recent Labs  Lab 04/05/20 0817 04/06/20 0715 04/08/20 1009 04/09/20 0744 04/10/20 0243  NA 138 134* 133* 137 135  K 4.3 4.5 3.9 4.0 4.4  CL 94* 90* 94* 97* 95*  CO2 27 25 23 24 25   GLUCOSE 128* 66* 138* 103* 109*  BUN 60* 69* 48* 43* 55*  CREATININE 8.04* 9.14* 7.19* 6.71* 7.77*  CALCIUM 8.5* 8.3* 8.0* 8.2* 8.4*  PHOS 7.1* 7.1* 5.7*  --   --     Studies: DG Foot Complete Left  Result Date: 04/09/2020 CLINICAL DATA:  Foot pain and swelling EXAM: LEFT FOOT - COMPLETE 3+ VIEW COMPARISON:  02/01/2020 MRI, 01/31/2020 plain film FINDINGS: There is been interval amputation of the first toe. Persistent soft tissue wound is seen. There is lucency identified in the head of the first metatarsal suspicious for recurrent osteomyelitis. No other fracture or dislocation is seen. No other soft tissue abnormality is noted. IMPRESSION: Changes suspicious for osteomyelitis in the head of the first metatarsal. Overlying soft tissue wound is noted. Electronically Signed   By: Inez Catalina M.D.   On: 04/09/2020 19:40    Scheduled Meds: . sodium chloride   Intravenous Once  . allopurinol  100 mg Oral Daily  . atorvastatin  40 mg Oral q1800  . buPROPion  150 mg Oral BID  . Chlorhexidine Gluconate Cloth  6 each Topical Daily  . Chlorhexidine Gluconate Cloth  6 each Topical Q0600  . gabapentin   300 mg Oral QHS  . heparin      . insulin aspart  0-5 Units Subcutaneous QHS  . insulin aspart  0-9 Units Subcutaneous TID WC  . insulin aspart  2 Units Subcutaneous TID WC  . latanoprost  1 drop Both Eyes QHS  . linaclotide  72 mcg Oral QAC breakfast  . loratadine  10 mg Oral Daily  . metoprolol tartrate  25 mg Oral BID  . oxyCODONE      . pantoprazole  40 mg Oral Daily  . polyethylene glycol  17 g Oral BID  . senna-docusate  1 tablet Oral BID  . sucroferric oxyhydroxide  1,000 mg Oral TID WC   Continuous Infusions: .  ceFAZolin (ANCEF) IV Stopped (04/09/20 2224)   PRN Meds: acetaminophen, albuterol, mometasone-formoterol, nitroGLYCERIN, oxyCODONE  Time spent: 35 minutes  Author: Berle Mull, MD [facility] 04/10/2020 6:18 PM  To reach On-call, see care teams to locate  the attending and reach out to them via [email or web address]. If 7PM-7AM, please contact night-coverage If you still have difficulty reaching the attending provider, please page the [facility] (Director on Call) for [facility] on amion for assistance.

## 2020-04-10 NOTE — Evaluation (Signed)
Occupational Therapy Evaluation Patient Details Name: [name] [name] MRN: [id number] DOB: [birth year] Today's Date: 04/10/2020    History of Present Illness Patient is a 69 y/o female who presents with lower GI bleed due to duodenal AVM and fever, SIRS-like picture. 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   Clinical Impression   PTA, pt was at SNF for rehab and was performing functional mobility with therapy and requiring assistance for ADLs. Pt currently requiring Min A for UB ADLs, Max A for LB ADLs, and Mod A for functional transfers with RW. Pt presenting with decreased balance, strength, cognition, and activity tolerance. Pt with large BM (notified RN) and requiring Max A +2 for peri care and Mod A+2 for transfers to/from toilet. Pt would benefit from further acute OT to facilitate safe dc. Recommend dc to SNF for further OT to optimize safety, independence with ADLs, and return to PLOF.     Follow Up Recommendations  SNF;Supervision/Assistance - 24 hour    Equipment Recommendations  Other (comment)(Defer to next venue)    Recommendations for Other Services PT consult     Precautions / Restrictions Precautions Precautions: Fall Precaution Comments: left great toe amputation; reports no WB restrictions Restrictions Weight Bearing Restrictions: No      Mobility Bed Mobility Overal bed mobility: Needs Assistance Bed Mobility: Supine to Sit;Sit to Supine     Supine to sit: Min guard;HOB elevated Sit to supine: Mod assist;HOB elevated   General bed mobility comments: assist to bring LEs into bed; able to help scooting self up in bed pushing through bil feet  Transfers Overall transfer level: Needs assistance Equipment used: Rolling walker (2 wheeled) Transfers: Sit to/from Stand;Lateral/Scoot Transfers Sit to Stand: Mod assist         General transfer comment: Mod A to  power up into standing and then weight shift forward. Pt with poro hand placement at RW and contniuing to place her hands at front og walker despite cues to maitnain position on rails.     Balance Overall balance assessment: Needs assistance Sitting-balance support: Feet supported;No upper extremity supported Sitting balance-Leahy Scale: Good Sitting balance - Comments: supervision for safety. Total A to donn sock   Standing balance support: During functional activity Standing balance-Leahy Scale: Poor Standing balance comment: Requires BUE support and external support, limited tolerance due to pain.                           ADL either performed or assessed with clinical judgement   ADL Overall ADL's : Needs assistance/impaired Eating/Feeding: Set up;Sitting   Grooming: Set up;Supervision/safety;Sitting   Upper Body Bathing: Minimal assistance;Sitting   Lower Body Bathing: Maximal assistance;Sit to/from stand   Upper Body Dressing : Minimal assistance;Sitting   Lower Body Dressing: Maximal assistance;Sit to/from stand Lower Body Dressing Details (indicate cue type and reason): Max A for donning socks Toilet Transfer: Moderate assistance;Stand-pivot;RW;BSC Toilet Transfer Details (indicate cue type and reason): Mod A for balance to [facility] with use of RW. Pt with poor hand placement at RW. Once at [facility], pt fatigued and requiring sit<>stand and transitioning BSC and bed behind her. Toileting- Clothing Manipulation and Hygiene: Maximal assistance;+2 for physical assistance Toileting - Clothing Manipulation Details (indicate cue type and reason): One person to maintain standing balance nad encourage tolerance in standing. Second person for peri care     Functional  mobility during ADLs: Moderate assistance;Rolling walker(stand pivot and sit<>stand only) General ADL Comments: pt demonstrating decreased ROM, strength, balance, and acitvity tolerance.      Vision          Perception     Praxis      Pertinent Vitals/Pain Pain Assessment: Faces Faces Pain Scale: Hurts even more Pain Location: LLE with weight bearing Pain Descriptors / Indicators: Grimacing;Sore Pain Intervention(s): Monitored during session;Limited activity within patient's tolerance;Repositioned     Hand Dominance Right   Extremity/Trunk Assessment Upper Extremity Assessment Upper Extremity Assessment: Overall WFL for tasks assessed   Lower Extremity Assessment Lower Extremity Assessment: Defer to PT evaluation LLE Deficits / Details: Reports phantom pain "where my toe used to be." LLE Sensation: decreased light touch   Cervical / Trunk Assessment Cervical / Trunk Assessment: Other exceptions Cervical / Trunk Exceptions: Increased body habitus   Communication Communication Communication: No difficulties   Cognition Arousal/Alertness: Awake/alert Behavior During Therapy: WFL for tasks assessed/performed Overall Cognitive Status: No family/caregiver present to determine baseline cognitive functioning                                 General Comments: Pt with incontient information. Pt also tangiental during conversation. Unsure of baseline cognition   General Comments  Pt with large BM; watery and notified RN    Exercises     Shoulder Instructions      Home Living Family/patient expects to be discharged to:: Skilled nursing facility(Return to [facility]) Living Arrangements: Spouse/significant other Available Help at Discharge: Available 24 hours/day;Family Type of Home: Apartment Home Access: Stairs to enter Entrance Stairs-Number of Steps: 13 (6+7) Entrance Stairs-Rails: Can reach both Home Layout: One level     Bathroom Shower/Tub: Teacher, early years/pre: Standard Bathroom Accessibility: Yes   Home Equipment: Shower seat;Grab bars - tub/shower;Cane - quad;Walker - 2 wheels          Prior Functioning/Environment Level of  Independence: Needs assistance  Gait / Transfers Assistance Needed: Reports she was doing minimal ambulation at [facility] since toe amputation. ADL's / Homemaking Assistance Needed: Needing assist with ADLs.   Comments: States her spouse is getting a new apt on ground level and she plans to return there once rehab is over end of April.        OT Problem List: Decreased strength;Decreased range of motion;Decreased activity tolerance;Impaired balance (sitting and/or standing);Decreased cognition;Decreased safety awareness;Decreased knowledge of use of DME or AE;Decreased knowledge of precautions;Pain;Impaired tone      OT Treatment/Interventions: Self-care/ADL training;Therapeutic exercise;Energy conservation;DME and/or AE instruction;Therapeutic activities;Patient/family education    OT Goals(Current goals can be found in the care plan section) Acute Rehab OT Goals Patient Stated Goal: to return to rehab to get back to independence OT Goal Formulation: With patient Time For Goal Achievement: 04/24/20 Potential to Achieve Goals: Good  OT Frequency: Min 2X/week   Barriers to D/C:            Co-evaluation              AM-PAC OT "6 Clicks" Daily Activity     Outcome Measure Help from another person eating meals?: A Little Help from another person taking care of personal grooming?: A Little Help from another person toileting, which includes using toliet, bedpan, or urinal?: A Lot Help from another person bathing (including washing, rinsing, drying)?: A Lot Help from another person to put on  and taking off regular upper body clothing?: A Little Help from another person to put on and taking off regular lower body clothing?: A Lot 6 Click Score: 15   End of Session Equipment Utilized During Treatment: Rolling walker;Gait belt Nurse Communication: Mobility status  Activity Tolerance: Patient limited by pain Patient left: in bed;with call bell/phone within reach;with bed  alarm set  OT Visit Diagnosis: Unsteadiness on feet (R26.81);Other abnormalities of gait and mobility (R26.89);Muscle weakness (generalized) (M62.81);Pain Pain - Right/Left: Left Pain - part of body: Ankle and joints of foot                Time: 1514-1600 OT Time Calculation (min): 46 min Charges:  OT General Charges $OT Visit: 1 Visit OT Evaluation $OT Eval Moderate Complexity: 1 Mod OT Treatments $Self Care/Home Management : 23-37 mins  [place], OTR/L Acute Rehab Pager: [phone] Office: [place] 04/10/2020, 5:41 PM

## 2020-04-10 NOTE — Progress Notes (Signed)
[facility] for Infectious Disease  Date of Admission:  04/03/2020     Total days of antibiotics 4         ASSESSMENT:  Ms. Noack has x-ray changes concerning of suspected osteomyelitis of the first metatarsal which is likely the source of her fevers which are improving. Recommend evaluation by Vascular Surgery to determine any need for surgical intervention/debridement. No current bacteremia. Will continue current dose of Cefazolin and will need 6 weeks with dialysis if no further surgical intervention recommended.  PLAN:  1. Continue cefazolin. 2. Monitor cultures to ensure no bacteremia. 3. Recommend Vascular Surgery evaluation for possible surgical intervention as needed.   Principal Problem:   Osteomyelitis ([place]) Active Problems:   Essential hypertension   Type 2 diabetes mellitus with chronic kidney disease on chronic dialysis, with long-term current use of insulin (HCC)   Chronic combined systolic and diastolic CHF (congestive heart failure) (HCC)   ESRD on dialysis (HCC)   Symptomatic anemia   GI bleed   . sodium chloride   Intravenous Once  . allopurinol  100 mg Oral Daily  . atorvastatin  40 mg Oral q1800  . buPROPion  150 mg Oral BID  . Chlorhexidine Gluconate Cloth  6 each Topical Daily  . Chlorhexidine Gluconate Cloth  6 each Topical Q0600  . gabapentin  300 mg Oral QHS  . heparin      . insulin aspart  0-5 Units Subcutaneous QHS  . insulin aspart  0-9 Units Subcutaneous TID WC  . insulin aspart  2 Units Subcutaneous TID WC  . latanoprost  1 drop Both Eyes QHS  . linaclotide  72 mcg Oral QAC breakfast  . loratadine  10 mg Oral Daily  . metoprolol tartrate  25 mg Oral BID  . oxyCODONE      . pantoprazole  40 mg Oral Daily  . polyethylene glycol  17 g Oral BID  . senna-docusate  1 tablet Oral BID  . sucroferric oxyhydroxide  1,000 mg Oral TID WC    SUBJECTIVE:  Mildly elevated temperature overnight with no acute events. X-ray shows acute  osteomyelitis. Blood cultures remain with out growth to date.   Allergies  Allergen Reactions  . Naproxen Rash     Review of Systems: Review of Systems  Constitutional: Negative for chills, fever and weight loss.  Respiratory: Negative for cough, shortness of breath and wheezing.   Cardiovascular: Negative for chest pain and leg swelling.  Gastrointestinal: Negative for abdominal pain, constipation, diarrhea, nausea and vomiting.  Skin: Negative for rash.    OBJECTIVE: Vitals:   04/10/20 0815 04/10/20 0845 04/10/20 0915 04/10/20 0930  BP: (!) 132/55 115/62 (!) 90/39 139/66  Pulse: 81 66 80 73  Resp: 16 15    Temp:      TempSrc:      SpO2:      Weight:      Height:       Body mass index is 41.06 kg/m.  Physical Exam Constitutional:      General: She is not in acute distress.    Appearance: She is well-developed.     Comments: Lying in bed with head of bed elevated watching TV; pleasant.   Cardiovascular:     Rate and Rhythm: Normal rate and regular rhythm.     Heart sounds: Normal heart sounds.     Comments: Dialysis catheter currently infusing. Dressing clean and dry.  Pulmonary:     Effort: Pulmonary effort is normal.  Breath sounds: Normal breath sounds.  Skin:    General: Skin is warm and dry.  Neurological:     Mental Status: She is alert and oriented to person, place, and time.  Psychiatric:        Behavior: Behavior normal.        Thought Content: Thought content normal.        Judgment: Judgment normal.     Lab Results Lab Results  Component Value Date   WBC 10.0 04/10/2020   HGB 7.4 (L) 04/10/2020   HCT 24.0 (L) 04/10/2020   MCV 89.9 04/10/2020   PLT 290 04/10/2020    Lab Results  Component Value Date   CREATININE 7.77 (H) 04/10/2020   BUN 55 (H) 04/10/2020   NA 135 04/10/2020   K 4.4 04/10/2020   CL 95 (L) 04/10/2020   CO2 25 04/10/2020    Lab Results  Component Value Date   ALT 18 04/09/2020   AST 25 04/09/2020   ALKPHOS 62  04/09/2020   BILITOT 0.3 04/09/2020     Microbiology: Recent Results (from the past 240 hour(s))  SARS CORONAVIRUS 2 (TAT 6-24 HRS) Nasopharyngeal Nasopharyngeal Swab     Status: None   Collection Time: 04/03/20  7:00 PM   Specimen: Nasopharyngeal Swab  Result Value Ref Range Status   SARS Coronavirus 2 NEGATIVE NEGATIVE Final    Comment: (NOTE) SARS-CoV-2 target nucleic acids are NOT DETECTED. The SARS-CoV-2 RNA is generally detectable in upper and lower respiratory specimens during the acute phase of infection. Negative results do not preclude SARS-CoV-2 infection, do not rule out co-infections with other pathogens, and should not be used as the sole basis for treatment or other patient management decisions. Negative results must be combined with clinical observations, patient history, and epidemiological information. The expected result is Negative. Fact Sheet for Patients: SugarRoll.be Fact Sheet for Healthcare Providers: https://www.woods-mathews.com/ This test is not yet approved or cleared by the [place] FDA and  has been authorized for detection and/or diagnosis of SARS-CoV-2 by FDA under an Emergency Use Authorization (EUA). This EUA will remain  in effect (meaning this test can be used) for the duration of the COVID-19 declaration under Section 56 4(b)(1) of the Act, 21 U.S.C. section 360bbb-3(b)(1), unless the authorization is terminated or revoked sooner. Performed at [facility], [place] [address] [place], [place] [address]   Culture, blood (Routine X 2) w Reflex to ID Panel     Status: None   Collection Time: 04/04/20  5:20 PM   Specimen: BLOOD  Result Value Ref Range Status   Specimen Description   Final    BLOOD WRIST RIGHT Performed at [facility] [address] [place], [place] [address]    Special Requests   Final    BOTTLES DRAWN AEROBIC AND ANAEROBIC Blood Culture adequate  volume Performed at [facility] [address], [place], [place] [address]    Culture   Final    NO GROWTH 5 DAYS Performed at [facility], [place] [address], [place],  [address]    Report Status 04/09/2020 FINAL  Final  Culture, blood (Routine X 2) w Reflex to ID Panel     Status: None   Collection Time: 04/04/20  5:20 PM   Specimen: BLOOD RIGHT HAND  Result Value Ref Range Status   Specimen Description   Final    BLOOD RIGHT HAND Performed at [facility] [address]  [place], [place] [address]    Special Requests   Final    BOTTLES DRAWN AEROBIC AND ANAEROBIC Blood Culture adequate volume Performed at [facility] [address], [place], [place] [address]    Culture   Final    NO GROWTH 5 DAYS Performed at [facility], [place] [address], [place], [place] [address]    Report Status 04/09/2020 FINAL  Final  Surgical pcr screen     Status: None   Collection Time: 04/05/20 11:54 AM   Specimen: Nasal Mucosa; Nasal Swab  Result Value Ref Range Status   MRSA, PCR NEGATIVE NEGATIVE Final   Staphylococcus aureus NEGATIVE NEGATIVE Final    Comment: (NOTE) The Xpert SA Assay (FDA approved for NASAL specimens in patients 88 years of age and older), is one component of a comprehensive surveillance program. It is not intended to diagnose infection nor to guide or monitor treatment. Performed at [facility], [place] [address], [place], [place] [address]   Culture, blood (routine x 2)     Status: None (Preliminary result)   Collection Time: 04/09/20  7:28 AM   Specimen: BLOOD  Result Value Ref Range Status   Specimen Description BLOOD RIGHT ANTECUBITAL  Final   Special Requests   Final    BOTTLES DRAWN AEROBIC AND ANAEROBIC Blood Culture adequate volume   Culture   Final    NO GROWTH 1 DAY Performed at [facility], [place] [address], [place], [place] [address]    Report Status PENDING   Incomplete  Culture, blood (routine x 2)     Status: None (Preliminary result)   Collection Time: 04/09/20  7:34 AM   Specimen: BLOOD RIGHT HAND  Result Value Ref Range Status   Specimen Description BLOOD RIGHT HAND  Final   Special Requests   Final    BOTTLES DRAWN AEROBIC AND ANAEROBIC Blood Culture adequate volume   Culture   Final    NO GROWTH 1 DAY Performed at [facility], [place] [address], [place], [place] [address]    Report Status PENDING  Incomplete     Terri Piedra, NP [facility]  04/10/2020  9:54 AM

## 2020-04-10 NOTE — Procedures (Signed)
I was present at this dialysis session. I have reviewed the session itself and made appropriate changes.   Vital signs in last 24 hours:  Temp:  [97.7 F (36.5 C)-100.4 F (38 C)] 98.9 F (37.2 C) (04/13 0720) Pulse Rate:  [61-97] 66 (04/13 0845) Resp:  [15-19] 15 (04/13 0845) BP: (109-132)/(50-77) 115/62 (04/13 0845) SpO2:  [82 %-100 %] 100 % (04/13 0720) Weight:  [122.5 kg] 122.5 kg (04/13 0720) Weight change:  Filed Weights   04/08/20 0830 04/08/20 1036 04/10/20 0720  Weight: 124.7 kg 122.7 kg 122.5 kg    Recent Labs  Lab 04/08/20 1009 04/09/20 0744 04/10/20 0243  NA 133*   < > 135  K 3.9   < > 4.4  CL 94*   < > 95*  CO2 23   < > 25  GLUCOSE 138*   < > 109*  BUN 48*   < > 55*  CREATININE 7.19*   < > 7.77*  CALCIUM 8.0*   < > 8.4*  PHOS 5.7*  --   --    < > = values in this interval not displayed.    Recent Labs  Lab 04/03/20 1630 04/04/20 0309 04/08/20 1009 04/09/20 0744 04/10/20 0243  WBC 8.9   < > 7.7 8.5 10.0  NEUTROABS 7.3  --   --  6.0  --   HGB 6.8*   < > 7.4* 7.7* 7.4*  HCT 22.4*   < > 23.5* 25.3* 24.0*  MCV 91.4   < > 91.4 90.0 89.9  PLT 269   < > 251 269 290   < > = values in this interval not displayed.    Scheduled Meds: . sodium chloride   Intravenous Once  . allopurinol  100 mg Oral Daily  . atorvastatin  40 mg Oral q1800  . buPROPion  150 mg Oral BID  . Chlorhexidine Gluconate Cloth  6 each Topical Daily  . Chlorhexidine Gluconate Cloth  6 each Topical Q0600  . gabapentin  300 mg Oral QHS  . insulin aspart  0-5 Units Subcutaneous QHS  . insulin aspart  0-9 Units Subcutaneous TID WC  . insulin aspart  2 Units Subcutaneous TID WC  . latanoprost  1 drop Both Eyes QHS  . linaclotide  72 mcg Oral QAC breakfast  . loratadine  10 mg Oral Daily  . metoprolol tartrate  25 mg Oral BID  . pantoprazole  40 mg Oral Daily  . polyethylene glycol  17 g Oral BID  . senna-docusate  1 tablet Oral BID  . sucroferric oxyhydroxide  1,000 mg Oral TID WC    Continuous Infusions: . sodium chloride    . sodium chloride    .  ceFAZolin (ANCEF) IV Stopped (04/09/20 2224)   PRN Meds:.sodium chloride, sodium chloride, acetaminophen, albuterol, alteplase, heparin, lidocaine (PF), lidocaine-prilocaine, mometasone-formoterol, nitroGLYCERIN, oxyCODONE, pentafluoroprop-tetrafluoroeth    Dialysis Orders: [facility] TTS  4h50min 400/800 118kg 2K/2.5Ca RIJ TDC  -No heparin -Venofer 100 mg IV q HD until 4/17 -Mircera 225 mcg IV q 2 weeks (last 4/1) -Sensipar 30 mg PO TIW -Calcitriol 2.3mcg PO TIW  Assessment/Plan: 1. Recurrent GI bleeding. Hx AVMs. GIconsulted.EGD = Duoden AVM "nonbld " , and per GI =pt refuses Coloncsopy Prepx2,Gi signed off now  2. ABLA/Anemia CKD - HGB 6.8 on adm. Hgb 7.7 04/09/20. S/P 2 units PRBCs. Recent OP ESA dose. Follow HGB. BC NGTD, WBC 8.5. Continue Fe load. Stop oral iron in HD patient particularly since on ferric binders.  3. Fever on admissionAnd low grade in hosp. 100.4 This am Covid negative. CXR clear. Blood cultures neg to date. Non-healing foot wound following with [facility]. Per primarystarted Keflex po for Left LE cellulitis presumed with HO PVD sp L GRT Toe amp 02/09. Has started using AVF, have TDC removed ASAP as OP  4. ESRD -HD TTS.Continue to use AVF. Send order for new AVF cannulation protocol, remove TDC when successfully using 15g needles.  5. Hypertension/volume - BP low/stable. UF to EDW as tolerated 6. Metabolic bone disease -Continue binders/Sensipar/Calcitriol for now 7. Nutrition -Renal diet/prot supp 8. Disposition- awaiting to be stable for discharge to SNF  Donetta Potts,  MD 04/10/2020, 9:09 AM

## 2020-04-10 NOTE — Plan of Care (Signed)
  Problem: Education: Goal: Knowledge of disease and its progression will improve Outcome: Progressing Goal: Individualized Educational Video(s) Outcome: Progressing   Problem: Fluid Volume: Goal: Compliance with measures to maintain balanced fluid volume will improve Outcome: Progressing   Problem: Health Behavior/Discharge Planning: Goal: Ability to manage health-related needs will improve Outcome: Progressing   Problem: Nutritional: Goal: Ability to make healthy dietary choices will improve Outcome: Progressing   Problem: Clinical Measurements: Goal: Complications related to the disease process, condition or treatment will be avoided or minimized Outcome: Progressing   Problem: Education: Goal: Knowledge of General Education information will improve Description: Including pain rating scale, medication(s)/side effects and non-pharmacologic comfort measures Outcome: Progressing   Problem: Health Behavior/Discharge Planning: Goal: Ability to manage health-related needs will improve Outcome: Progressing   Problem: Clinical Measurements: Goal: Ability to maintain clinical measurements within normal limits will improve Outcome: Progressing Goal: Will remain free from infection Outcome: Progressing Goal: Diagnostic test results will improve Outcome: Progressing Goal: Respiratory complications will improve Outcome: Progressing Goal: Cardiovascular complication will be avoided Outcome: Progressing   Problem: Activity: Goal: Risk for activity intolerance will decrease Outcome: Progressing   Problem: Nutrition: Goal: Adequate nutrition will be maintained Outcome: Progressing   Problem: Coping: Goal: Level of anxiety will decrease Outcome: Progressing   Problem: Elimination: Goal: Will not experience complications related to bowel motility Outcome: Progressing Goal: Will not experience complications related to urinary retention Outcome: Progressing   Problem: Pain  Managment: Goal: General experience of comfort will improve Outcome: Progressing   Problem: Safety: Goal: Ability to remain free from injury will improve Outcome: Progressing   Problem: Skin Integrity: Goal: Risk for impaired skin integrity will decrease Outcome: Progressing   

## 2020-04-10 NOTE — Consult Note (Addendum)
Patient name: [name] [name] MRN: [id number] DOB: 03-Jan-1951 Sex: female  REASON FOR CONSULT:   Osteomyelitis of left first metatarsal head.  The consult is requested by Dr. Posey Pronto  HPI:   [name] [name] is a pleasant 69 y.o. female who presented with a wound on the left great toe and infrainguinal arterial occlusive disease.  On 02/06/2020, Dr. Donzetta Matters performed jetstream atherectomy of the left superficial femoral artery and popliteal artery with stenting of the left popliteal artery and drug-coated balloon angioplasty of the left superficial femoral artery.  The following day the patient underwent revision of her left arm fistula and amputation of the left great toe.  The patient was seen on 03/02/2020 by Gerri Lins, PA.  At that time the left great toe amputation site was healing well.  The patient was to return in 2 weeks for suture removal.  On 02/27/2020, the patient had an arterial Doppler study which showed monophasic Doppler signals in the left foot with an ABI of 89%.  ABI on the right was 41% with monophasic signals.  Duplex of the left leg showed biphasic flow throughout the superficial femoral artery, popliteal artery, and posterior tibial artery.  Thus she had a good result from her endovascular revascularization.  The patient was admitted with weakness and anemia.  She was evaluated by gastroenterology and has an extensive history of recurrent GI bleeds.  She has a history of AVMs in the stomach small bowel and colon.  This was felt to be the cause of her recurrent anemia and EGD and colonoscopy were recommended.  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  The patient refused colonoscopy and GI has signed off.  Current Facility-Administered Medications  Medication Dose Route Frequency Provider Last Rate Last Admin  . 0.9 %  sodium chloride infusion (Manually program via Guardrails IV Fluids)   Intravenous Once Lavina Hamman, MD      . acetaminophen  (TYLENOL) tablet 650 mg  650 mg Oral Q6H PRN Pokhrel, Laxman, MD   650 mg at 04/09/20 2038  . albuterol (PROVENTIL) (2.5 MG/3ML) 0.083% nebulizer solution 2.5 mg  2.5 mg Inhalation Q6H PRN Norins, Heinz Knuckles, MD      . allopurinol (ZYLOPRIM) tablet 100 mg  100 mg Oral Daily Norins, Heinz Knuckles, MD   100 mg at 04/10/20 1306  . atorvastatin (LIPITOR) tablet 40 mg  40 mg Oral q1800 Norins, Heinz Knuckles, MD   40 mg at 04/09/20 1743  . buPROPion [facility] SR) 12 hr tablet 150 mg  150 mg Oral BID Neena Rhymes, MD   150 mg at 04/09/20 1744  . ceFAZolin (ANCEF) IVPB 1 g/50 mL premix  1 g Intravenous Q24H Lavina Hamman, MD   Stopped at 04/09/20 2224  . Chlorhexidine Gluconate Cloth 2 % PADS 6 each  6 each Topical Daily Norins, Heinz Knuckles, MD   6 each at 04/09/20 [id number]  . Chlorhexidine Gluconate Cloth 2 % PADS 6 each  6 each Topical Q0600 Lynnda Child, PA-C   6 each at 04/10/20 0559  . gabapentin (NEURONTIN) capsule 300 mg  300 mg Oral QHS Norins, Heinz Knuckles, MD   300 mg at 04/09/20 2147  . heparin 1000 UNIT/ML injection           . insulin aspart (novoLOG) injection 0-5 Units  0-5 Units Subcutaneous QHS Lavina Hamman, MD      . insulin aspart (novoLOG) injection 0-9 Units  0-9 Units Subcutaneous TID WC Lavina Hamman, MD   2 Units at 04/09/20 1743  . insulin aspart (novoLOG) injection 2 Units  2 Units Subcutaneous TID WC Norins, Heinz Knuckles, MD   2 Units at 04/09/20 1743  . latanoprost (XALATAN) 0.005 % ophthalmic solution 1 drop  1 drop Both Eyes QHS Norins, Heinz Knuckles, MD   1 drop at 04/09/20 2155  . linaclotide (LINZESS) capsule 72 mcg  72 mcg Oral QAC breakfast Neena Rhymes, MD   72 mcg at 04/10/20 1305  . loratadine (CLARITIN) tablet 10 mg  10 mg Oral Daily Norins, Heinz Knuckles, MD   10 mg at 04/10/20 1305  . metoprolol tartrate (LOPRESSOR) tablet 25 mg  25 mg Oral BID Pokhrel, Laxman, MD   25 mg at 04/10/20 1306  . mometasone-formoterol (DULERA) 100-5 MCG/ACT inhaler 2 puff  2 puff  Inhalation BID PRN Norins, Heinz Knuckles, MD      . nitroGLYCERIN (NITROSTAT) SL tablet 0.4 mg  0.4 mg Sublingual Q5 min PRN Norins, Heinz Knuckles, MD      . oxyCODONE (Oxy IR/ROXICODONE) 5 MG immediate release tablet           . oxyCODONE (Oxy IR/ROXICODONE) immediate release tablet 5 mg  5 mg Oral Q6H PRN Norins, Heinz Knuckles, MD   5 mg at 04/10/20 0951  . pantoprazole (PROTONIX) EC tablet 40 mg  40 mg Oral Daily Norins, Heinz Knuckles, MD   40 mg at 04/10/20 1306  . polyethylene glycol (MIRALAX / GLYCOLAX) packet 17 g  17 g Oral BID Lavina Hamman, MD   17 g at 04/10/20 1307  . senna-docusate (Senokot-S) tablet 1 tablet  1 tablet Oral BID Lavina Hamman, MD   1 tablet at 04/10/20 1306  . sucroferric oxyhydroxide (VELPHORO) chewable tablet 1,000 mg  1,000 mg Oral TID WC Vena Rua, PA-C   1,000 mg at 04/10/20 1307    REVIEW OF SYSTEMS:  [X]  denotes positive finding, [ ]  denotes negative finding Vascular    Leg swelling    Cardiac    Chest pain or chest pressure:    Shortness of breath upon exertion:    Short of breath when lying flat:    Irregular heart rhythm:    Constitutional    Fever or chills:     PHYSICAL EXAM:   Vitals:   04/10/20 1100 04/10/20 1130 04/10/20 1140 04/10/20 1219  BP: 134/66 (!) 119/50 (!) 120/55 140/90  Pulse: 82 74 73 87  Resp:   17 18  Temp:   97.9 F (36.6 C) 99.8 F (37.7 C)  TempSrc:   Oral Oral  SpO2:   98% 96%  Weight:   121.3 kg   Height:       Body mass index is 40.66 kg/m.  GENERAL: The patient is a well-nourished female, in no acute distress. The vital signs are documented above. CARDIOVASCULAR: There is a regular rate and rhythm. PULMONARY: There is good air exchange bilaterally without wheezing or rales. VASCULAR:  She has palpable femoral pulses. She has a brisk posterior tibial signal on the left with the Doppler and a monophasic dorsalis pedis signal. Her left upper arm AV fistula has a good thrill. EXTREMITIES: The wound overlying her  toe amputation site of the left great toe is not healing with dry eschar.    DATA:   ARTERIOGRAPHY WITH ANGIOPLASTY AND STENTING OF THE LEFT SUPERFICIAL FEMORAL ARTERY AND POPLITEAL ARTERY: These results are discussed above.  X-RAY LEFT FOOT: I reviewed the x-ray of the left foot which shows evidence of osteomyelitis in the head of the first metatarsal.  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  The toe amputation site is not healing with evidence of osteomyelitis on x-ray.  I think she will require a more extensive ray amputation of the left great toe.  She has a brisk posterior tibial signal with the Doppler.  She dialyzes Tuesdays Thursdays and Saturdays.  I have scheduled this for tomorrow.   END-STAGE RENAL DISEASE: She dialyzes Tuesdays Thursdays and Saturdays  Deitra Mayo [facility] [place]

## 2020-04-10 NOTE — Progress Notes (Signed)
OT Cancellation Note  Patient Details Name: MIALANI REICKS MRN: [id number] DOB: 1951/10/17   Cancelled Treatment:    Reason Eval/Treat Not Completed: Patient at procedure or test/ unavailable, pt in HD. Will follow and see as able.   Jolaine Artist, OT Acute Rehabilitation Services Pager [phone] Office [phone]    Delight Stare 04/10/2020, 7:50 AM

## 2020-04-11 ENCOUNTER — Inpatient Hospital Stay (HOSPITAL_COMMUNITY): Payer: Medicare (Managed Care) | Admitting: Certified Registered"

## 2020-04-11 ENCOUNTER — Encounter (HOSPITAL_COMMUNITY)
Admission: EM | Disposition: A | Discharge status: Skilled Nursing Facility | Payer: Self-pay | Source: Skilled Nursing Facility | Attending: Internal Medicine

## 2020-04-11 ENCOUNTER — Encounter (HOSPITAL_COMMUNITY): Payer: Self-pay | Admitting: Internal Medicine

## 2020-04-11 DIAGNOSIS — K920 Hematemesis: Secondary | ICD-10-CM

## 2020-04-11 DIAGNOSIS — M86679 Other chronic osteomyelitis, unspecified ankle and foot: Secondary | ICD-10-CM

## 2020-04-11 DIAGNOSIS — I96 Gangrene, not elsewhere classified: Secondary | ICD-10-CM

## 2020-04-11 HISTORY — PX: AMPUTATION: SHX166

## 2020-04-11 LAB — CBC
HCT: 25.8 % — ABNORMAL LOW (ref 36.0–46.0)
Hemoglobin: 8.1 g/dL — ABNORMAL LOW (ref 12.0–15.0)
MCH: 28.2 pg (ref 26.0–34.0)
MCHC: 31.4 g/dL (ref 30.0–36.0)
MCV: 89.9 fL (ref 80.0–100.0)
Platelets: 302 10*3/uL (ref 150–400)
RBC: 2.87 MIL/uL — ABNORMAL LOW (ref 3.87–5.11)
RDW: 18.4 % — ABNORMAL HIGH (ref 11.5–15.5)
WBC: 12.6 10*3/uL — ABNORMAL HIGH (ref 4.0–10.5)
nRBC: 0 % (ref 0.0–0.2)

## 2020-04-11 LAB — GLUCOSE, CAPILLARY
Glucose-Capillary: 161 mg/dL — ABNORMAL HIGH (ref 70–99)
Glucose-Capillary: 136 mg/dL — ABNORMAL HIGH (ref 70–99)
Glucose-Capillary: 111 mg/dL — ABNORMAL HIGH (ref 70–99)
Glucose-Capillary: 99 mg/dL (ref 70–99)
Glucose-Capillary: 145 mg/dL — ABNORMAL HIGH (ref 70–99)
Glucose-Capillary: 83 mg/dL (ref 70–99)

## 2020-04-11 LAB — BASIC METABOLIC PANEL
Anion gap: 16 — ABNORMAL HIGH (ref 5–15)
BUN: 28 mg/dL — ABNORMAL HIGH (ref 8–23)
CO2: 25 mmol/L (ref 22–32)
Calcium: 8.3 mg/dL — ABNORMAL LOW (ref 8.9–10.3)
Chloride: 98 mmol/L (ref 98–111)
Creatinine, Ser: 5.29 mg/dL — ABNORMAL HIGH (ref 0.44–1.00)
GFR calc Af Amer: 9 mL/min — ABNORMAL LOW (ref >60)
GFR calc non Af Amer: 8 mL/min — ABNORMAL LOW (ref >60)
Glucose, Bld: 109 mg/dL — ABNORMAL HIGH (ref 70–99)
Potassium: 4 mmol/L (ref 3.5–5.1)
Sodium: 139 mmol/L (ref 135–145)

## 2020-04-11 SURGERY — AMPUTATION DIGIT
Anesthesia: General | Site: Foot | Laterality: Left

## 2020-04-11 MED ORDER — ONDANSETRON HCL 4 MG/2ML IJ SOLN
INTRAMUSCULAR | Status: DC | PRN
  Administered 2020-04-11: 4 mg via INTRAVENOUS

## 2020-04-11 MED ORDER — ONDANSETRON HCL 4 MG/2ML IJ SOLN
INTRAMUSCULAR | Status: AC
  Filled 2020-04-11: qty 2

## 2020-04-11 MED ORDER — GLYCOPYRROLATE PF 0.2 MG/ML IJ SOSY
PREFILLED_SYRINGE | INTRAMUSCULAR | Status: AC
  Filled 2020-04-11: qty 1

## 2020-04-11 MED ORDER — HYDROMORPHONE HCL 1 MG/ML IJ SOLN: 0.2500 mg | INTRAMUSCULAR | Status: DC | PRN

## 2020-04-11 MED ORDER — SODIUM CHLORIDE 0.9 % IV SOLN: INTRAVENOUS | Status: DC | PRN

## 2020-04-11 MED ORDER — PHENYLEPHRINE 40 MCG/ML (10ML) SYRINGE FOR IV PUSH (FOR BLOOD PRESSURE SUPPORT)
PREFILLED_SYRINGE | INTRAVENOUS | Status: DC | PRN
  Administered 2020-04-11: 80 ug via INTRAVENOUS

## 2020-04-11 MED ORDER — PROPOFOL 10 MG/ML IV BOLUS
INTRAVENOUS | Status: AC
  Filled 2020-04-11: qty 20

## 2020-04-11 MED ORDER — GLYCOPYRROLATE PF 0.2 MG/ML IJ SOSY
PREFILLED_SYRINGE | INTRAMUSCULAR | Status: DC | PRN
  Administered 2020-04-11: .1 mg via INTRAVENOUS

## 2020-04-11 MED ORDER — FENTANYL CITRATE (PF) 100 MCG/2ML IJ SOLN
INTRAMUSCULAR | Status: DC | PRN
  Administered 2020-04-11 (×2): 50 ug via INTRAVENOUS

## 2020-04-11 MED ORDER — MIDAZOLAM HCL 2 MG/2ML IJ SOLN: 0.5000 mg | Freq: Once | INTRAMUSCULAR | Status: DC | PRN

## 2020-04-11 MED ORDER — PROPOFOL 10 MG/ML IV BOLUS
INTRAVENOUS | Status: DC | PRN
  Administered 2020-04-11: 130 mg via INTRAVENOUS

## 2020-04-11 MED ORDER — LIDOCAINE 2% (20 MG/ML) 5 ML SYRINGE
INTRAMUSCULAR | Status: DC | PRN
  Administered 2020-04-11: 40 mg via INTRAVENOUS

## 2020-04-11 MED ORDER — FENTANYL CITRATE (PF) 250 MCG/5ML IJ SOLN
INTRAMUSCULAR | Status: AC
  Filled 2020-04-11: qty 5

## 2020-04-11 MED ORDER — DEXMEDETOMIDINE HCL 200 MCG/2ML IV SOLN
INTRAVENOUS | Status: DC | PRN
  Administered 2020-04-11 (×2): 8 ug via INTRAVENOUS

## 2020-04-11 MED ORDER — MIDAZOLAM HCL 2 MG/2ML IJ SOLN
INTRAMUSCULAR | Status: AC
  Filled 2020-04-11: qty 2

## 2020-04-11 MED ORDER — PROMETHAZINE HCL 25 MG/ML IJ SOLN: 6.2500 mg | INTRAMUSCULAR | Status: DC | PRN

## 2020-04-11 MED ORDER — PHENYLEPHRINE 40 MCG/ML (10ML) SYRINGE FOR IV PUSH (FOR BLOOD PRESSURE SUPPORT)
PREFILLED_SYRINGE | INTRAVENOUS | Status: AC
  Filled 2020-04-11: qty 10

## 2020-04-11 MED ORDER — SODIUM CHLORIDE 0.9 % IV SOLN: INTRAVENOUS | Status: DC

## 2020-04-11 MED ORDER — LIDOCAINE 2% (20 MG/ML) 5 ML SYRINGE
INTRAMUSCULAR | Status: AC
  Filled 2020-04-11: qty 5

## 2020-04-11 MED ORDER — 0.9 % SODIUM CHLORIDE (POUR BTL) OPTIME
TOPICAL | Status: DC | PRN
  Administered 2020-04-11: 1000 mL

## 2020-04-11 MED ORDER — PHENYLEPHRINE HCL-NACL 10-0.9 MG/250ML-% IV SOLN
INTRAVENOUS | Status: DC | PRN
  Administered 2020-04-11: 50 ug/min via INTRAVENOUS

## 2020-04-11 MED ORDER — MEPERIDINE HCL 25 MG/ML IJ SOLN: 6.2500 mg | INTRAMUSCULAR | Status: DC | PRN

## 2020-04-11 SURGICAL SUPPLY — 34 items
BLADE AVERAGE 25MMX9MM (BLADE) ×1
BLADE AVERAGE 25X9 (BLADE) ×1 IMPLANT
BLADE SAW SGTL 81X20 HD (BLADE) ×2 IMPLANT
BNDG ELASTIC 4X5.8 VLCR STR LF (GAUZE/BANDAGES/DRESSINGS) ×3 IMPLANT
BNDG GAUZE ELAST 4 BULKY (GAUZE/BANDAGES/DRESSINGS) ×3 IMPLANT
CANISTER SUCT 3000ML PPV (MISCELLANEOUS) ×3 IMPLANT
COVER SURGICAL LIGHT HANDLE (MISCELLANEOUS) ×3 IMPLANT
COVER WAND RF STERILE (DRAPES) ×3 IMPLANT
DRAPE EXTREMITY ABC'S (DRAPES) ×1
DRAPE EXTREMITY ABCS (DRAPES) ×1 IMPLANT
DRAPE EXTREMITY T 121X128X90 (DISPOSABLE) ×3 IMPLANT
DRAPE HALF SHEET 40X57 (DRAPES) ×3 IMPLANT
ELECT REM PT RETURN 9FT ADLT (ELECTROSURGICAL) ×3
ELECTRODE REM PT RTRN 9FT ADLT (ELECTROSURGICAL) ×1 IMPLANT
GAUZE SPONGE 4X4 12PLY STRL (GAUZE/BANDAGES/DRESSINGS) ×3 IMPLANT
GLOVE BIO SURGEON STRL SZ7.5 (GLOVE) ×3 IMPLANT
GLOVE INDICATOR 7.0 STRL GRN (GLOVE) ×2 IMPLANT
GLOVE SURG SS PI 6.5 STRL IVOR (GLOVE) ×2 IMPLANT
GOWN STRL REUS W/ TWL LRG LVL3 (GOWN DISPOSABLE) ×2 IMPLANT
GOWN STRL REUS W/ TWL XL LVL3 (GOWN DISPOSABLE) ×1 IMPLANT
GOWN STRL REUS W/TWL LRG LVL3 (GOWN DISPOSABLE) ×6
GOWN STRL REUS W/TWL XL LVL3 (GOWN DISPOSABLE) ×3
KIT BASIN OR (CUSTOM PROCEDURE TRAY) ×3 IMPLANT
KIT TURNOVER KIT B (KITS) ×3 IMPLANT
NDL HYPO 25GX1X1/2 BEV (NEEDLE) IMPLANT
NEEDLE HYPO 25GX1X1/2 BEV (NEEDLE) IMPLANT
NS IRRIG 1000ML POUR BTL (IV SOLUTION) ×3 IMPLANT
PACK GENERAL/GYN (CUSTOM PROCEDURE TRAY) ×3 IMPLANT
PAD ARMBOARD 7.5X6 YLW CONV (MISCELLANEOUS) ×6 IMPLANT
SUT ETHILON 3 0 PS 1 (SUTURE) ×5 IMPLANT
SYR CONTROL 10ML LL (SYRINGE) IMPLANT
TOWEL GREEN STERILE (TOWEL DISPOSABLE) ×6 IMPLANT
UNDERPAD 30X30 (UNDERPADS AND DIAPERS) ×3 IMPLANT
WATER STERILE IRR 1000ML POUR (IV SOLUTION) ×3 IMPLANT

## 2020-04-11 NOTE — Anesthesia Postprocedure Evaluation (Signed)
Anesthesia Post Note  Patient: [name] [name]  Procedure(s) Performed: Transmetatarsal Amputation left 1st Toe. (Left Foot)     Patient location during evaluation: PACU Anesthesia Type: General Level of consciousness: awake and alert, oriented and patient cooperative Pain management: pain level controlled Vital Signs Assessment: post-procedure vital signs reviewed and stable Respiratory status: spontaneous breathing, nonlabored ventilation, respiratory function stable and patient connected to nasal cannula oxygen Cardiovascular status: blood pressure returned to baseline and stable Postop Assessment: no apparent nausea or vomiting Anesthetic complications: no    Last Vitals:  Vitals:   04/11/20 1149 04/11/20 1450  BP: (!) 111/58 118/65  Pulse: 73 74  Resp: 18 18  Temp: 37.2 C 37 C  SpO2: 96% 98%    Last Pain:  Vitals:   04/11/20 1450  TempSrc: Oral  PainSc:                  Markice Torbert,E. Clemons Salvucci

## 2020-04-11 NOTE — Transfer of Care (Signed)
Immediate Anesthesia Transfer of Care Note  Patient: REBACCA VOTAW  Procedure(s) Performed: Transmetatarsal Amputation left 1st Toe. (Left Foot)  Patient Location: PACU  Anesthesia Type:General  Level of Consciousness: drowsy  Airway & Oxygen Therapy: Patient Spontanous Breathing and Patient connected to face mask oxygen  Post-op Assessment: Report given to RN and Post -op Vital signs reviewed and stable  Post vital signs: Reviewed and stable  Last Vitals:  Vitals Value Taken Time  BP    Temp    Pulse    Resp    SpO2      Last Pain:  Vitals:   04/11/20 0831  TempSrc: Oral  PainSc: 7       Patients Stated Pain Goal: 0 (73/22/02 5427)  Complications: No apparent anesthesia complications

## 2020-04-11 NOTE — Progress Notes (Signed)
[facility] Progress Note  Patient: [name] [name]    NFA:[id number]  DOA: 04/03/2020     Date of Service: the patient was seen and examined on 04/11/2020  Chief Complaint  Patient presents with  . Abnormal Lab   Brief hospital course: Past medical history of ESRD on HD, CAD S/P CABG, combined CHF, type II DM, CVA.  Recent anemia.  Patient presented with complaints of abnormal hemoglobin from hemodialysis.  She was transfused PRBC.  After that she started having fever and tachycardia.  Patient was transferred to [facility] for hemodialysis need and continuous observation for her sepsis-like picture. Patient continues to have low hemoglobin and therefore GI was consulted.  Patient has refused bowel prep x2 so far. GI has signed off. 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 Also found to have foot osteomyelitis needing surgical resection.  Assessment and Plan: 1.  Lower GI bleed SP EGD, duodenal AVM treated with APC Primary reason for patient's presentation.  Patient reported hematochezia. SP 2 PRBC. Monitor H&H. Patient has refused colonoscopy prep x2. Not a candidate for capsule endoscopy given no bowel prep. Remains at risk for further GI bleed. Prior colonoscopy last February was suggestive of an AVM treated with clips. Hemoglobin trending down but now stabilizing around 8.  Monitor.  No active bleeding reported by the patient as well.  2.  ESRD on HD Admitted to [facility] for ESRD. Continue HD appreciate nephrology input.  Getting dialysis on her schedule.  3.  History of combined chronic CHF Currently euvolemic.  Monitor.  4.  Type 2 diabetes mellitus with renal complication.  Uncontrolled with hyperglycemia Currently on sliding scale insulin.  Well-controlled.  5.  Essential hypertension. Blood pressure stable.  Monitor.  6.  Osteomyelitis of the foot. Nonhealing left leg ulcer. PVD. Blood cultures negative. ID consulted. X-ray shows  evidence of osteomyelitis of the foot. Patient underwent surgical procedure with revision of left great toe amputation today. Continue IV antibiotics with HD.  7. Morbid obesity. Body mass index is 40.67 kg/m.   Diet: Renal diet DVT Prophylaxis: SCD, pharmacological prophylaxis contraindicated due to Due to GI bleed   Advance goals of care discussion: Full code  Family Communication: no family was present at bedside, at the time of interview.   Disposition:  Patient for procedure today.  Possible home tomorrow after dialysis.  Subjective: No overnight events.  Came back from procedure.  Denies any complaints.  Physical Exam: General:  alert oriented to time, place, and person.  Appear in mild distress, affect appropriate Eyes: PERRL ENT: Oral Mucosa Clear, moist  Neck: no JVD,  Cardiovascular: S1 and S2 Present, no Murmur,  Respiratory: good respiratory effort, Bilateral Air entry equal, no Crackles, no wheezes Abdomen: Bowel Sound present, Soft and no tenderness,  Skin: no rash Extremities: no Pedal edema, no calf tenderness.  Left foot on immediate postop dressing.  Not opened. Neurologic: without any new focal findings    Vitals:   04/11/20 1115 04/11/20 1125 04/11/20 1149 04/11/20 1450  BP: (!) 102/43 100/72 (!) 111/58 118/65  Pulse: 62 65 73 74  Resp: 15 14 18 18   Temp:  98.5 F (36.9 C) 99 F (37.2 C) 98.6 F (37 C)  TempSrc:   Oral Oral  SpO2: 93% 98% 96% 98%  Weight:      Height:        Intake/Output Summary (Last 24 hours) at 04/11/2020 1637 Last data filed at 04/11/2020 1500 Gross per  24 hour  Intake 780 ml  Output 20 ml  Net 760 ml   Filed Weights   04/10/20 0720 04/10/20 1140 04/11/20 0904  Weight: 122.5 kg 121.3 kg 121.3 kg    Data Reviewed: I have personally reviewed and interpreted daily labs, tele strips, imagings as discussed above. I reviewed all nursing notes, pharmacy notes, vitals, pertinent old records I have discussed plan of care  as described above with RN and patient/family.  CBC: Recent Labs  Lab 04/06/20 2319 04/08/20 1009 04/09/20 0744 04/10/20 0243 04/11/20 0341  WBC 10.8* 7.7 8.5 10.0 12.6*  NEUTROABS  --   --  6.0  --   --   HGB 8.6* 7.4* 7.7* 7.4* 8.1*  HCT 27.3* 23.5* 25.3* 24.0* 25.8*  MCV 89.8 91.4 90.0 89.9 89.9  PLT 261 251 269 290 683   Basic Metabolic Panel: Recent Labs  Lab 04/05/20 0817 04/05/20 0817 04/06/20 0715 04/08/20 1009 04/09/20 0744 04/10/20 0243 04/11/20 0341  NA 138   < > 134* 133* 137 135 139  K 4.3   < > 4.5 3.9 4.0 4.4 4.0  CL 94*   < > 90* 94* 97* 95* 98  CO2 27   < > 25 23 24 25 25   GLUCOSE 128*   < > 66* 138* 103* 109* 109*  BUN 60*   < > 69* 48* 43* 55* 28*  CREATININE 8.04*   < > 9.14* 7.19* 6.71* 7.77* 5.29*  CALCIUM 8.5*   < > 8.3* 8.0* 8.2* 8.4* 8.3*  PHOS 7.1*  --  7.1* 5.7*  --   --   --    < > = values in this interval not displayed.    Studies: No results found.  Scheduled Meds: . sodium chloride   Intravenous Once  . allopurinol  100 mg Oral Daily  . atorvastatin  40 mg Oral q1800  . buPROPion  150 mg Oral BID  . Chlorhexidine Gluconate Cloth  6 each Topical Daily  . Chlorhexidine Gluconate Cloth  6 each Topical Q0600  . gabapentin  300 mg Oral QHS  . insulin aspart  0-5 Units Subcutaneous QHS  . insulin aspart  0-9 Units Subcutaneous TID WC  . insulin aspart  2 Units Subcutaneous TID WC  . latanoprost  1 drop Both Eyes QHS  . linaclotide  72 mcg Oral QAC breakfast  . loratadine  10 mg Oral Daily  . metoprolol tartrate  25 mg Oral BID  . pantoprazole  40 mg Oral Daily  . polyethylene glycol  17 g Oral BID  . senna-docusate  1 tablet Oral BID  . sucroferric oxyhydroxide  1,000 mg Oral TID WC   Continuous Infusions: .  ceFAZolin (ANCEF) IV 1 g (04/10/20 2152)   PRN Meds: acetaminophen, albuterol, mometasone-formoterol, nitroGLYCERIN, oxyCODONE  Time spent: 25 minutes

## 2020-04-11 NOTE — Op Note (Signed)
    Patient name: [name] [name] MRN: [id number] DOB: 1951-08-13 Sex: female  04/11/2020 Pre-operative Diagnosis: gangrene of left 1st toe amputation Post-operative diagnosis:  Same Surgeon:  Eda Paschal. Donzetta Matters, MD Procedure Performed:  Left first toe transmetatarsal amputation  Indications: 69yo female has undergone revascularization of atherectomy and stenting.  Had a first amputation that has failed to heal and she has been medicated for further debridement versus more proximal amputation  Findings: There was good capillary bleeding and pulsatile pedal artery bleeding   Procedure:  The patient was identified in the holding area and taken to the operating room where she was placed supine on the operating table.  LMA anesthesia was induced.  She was urgently antibiotics were up-to-date timeout was called.  We began with tennis racquet type incision around the existing amputation site.  Dissected back to the first metatarsal was transected with bone cutter.  We debrided all the skin and soft tissue back to healthy bleeding tissue.  A rasp was used to smooth the bone.  Wound was packed with a dry dressing.  She was awakened from anesthesia having tolerated procedure without any complication.  All counts were correct at completion.   EBL: 10cc    Dianelys Scinto C. Donzetta Matters, MD [facility] [place] Office: [phone] Pager: [phone]

## 2020-04-11 NOTE — Progress Notes (Signed)
  Progress Note    04/11/2020 9:30 AM Day of Surgery  Subjective:  No overnight issues  Vitals:   04/11/20 0428 04/11/20 0831  BP: (!) 114/46 (!) 115/49  Pulse: 75 (!) 56  Resp: 18 18  Temp: 99.9 F (37.7 C) 99.9 F (37.7 C)  SpO2: 99% 96%    Physical Exam: aaox3 Non labored respirations Left great toe dressing cdi  CBC    Component Value Date/Time   WBC 12.6 (H) 04/11/2020 0341   RBC 2.87 (L) 04/11/2020 0341   HGB 8.1 (L) 04/11/2020 0341   HCT 25.8 (L) 04/11/2020 0341   HCT 22.0 (L) 10/06/2019 0021   PLT 302 04/11/2020 0341   MCV 89.9 04/11/2020 0341   MCH 28.2 04/11/2020 0341   MCHC 31.4 04/11/2020 0341   RDW 18.4 (H) 04/11/2020 0341   LYMPHSABS 1.0 04/09/2020 0744   MONOABS 1.3 (H) 04/09/2020 0744   EOSABS 0.1 04/09/2020 0744   BASOSABS 0.1 04/09/2020 0744    BMET    Component Value Date/Time   NA 139 04/11/2020 0341   K 4.0 04/11/2020 0341   CL 98 04/11/2020 0341   CO2 25 04/11/2020 0341   GLUCOSE 109 (H) 04/11/2020 0341   BUN 28 (H) 04/11/2020 0341   CREATININE 5.29 (H) 04/11/2020 0341   CREATININE 2.62 (H) 04/29/2016 1629   CALCIUM 8.3 (L) 04/11/2020 0341   GFRNONAA 8 (L) 04/11/2020 0341   GFRAA 9 (L) 04/11/2020 0341    INR    Component Value Date/Time   INR 1.3 (H) 04/09/2020 0744     Intake/Output Summary (Last 24 hours) at 04/11/2020 0930 Last data filed at 04/11/2020 0900 Gross per 24 hour  Intake 240 ml  Output 1500 ml  Net -1260 ml     Assessment:  69 y.o. female is s/p left great toe amp and revascularization. Now has further necrosis. Plan: OR today for revision of left great toe amp   Taneasha Fuqua C. Donzetta Matters, MD [facility] [place] Office: [phone] Pager: [phone]  04/11/2020 9:30 AM

## 2020-04-11 NOTE — Progress Notes (Signed)
On call MD notified of pt. switching to yellow MEWS for a temp of 103.1. PRN Tylenol administered prior to informing MD. Will continue to monitor and implement yellow MEWS protocol.

## 2020-04-11 NOTE — Progress Notes (Addendum)
[facility] Progress Note   Subjective: Seen post OP, no C/Os.   Objective Vitals:   04/11/20 1105 04/11/20 1115 04/11/20 1125 04/11/20 1149  BP: (!) 82/46 (!) 102/43 100/72 (!) 111/58  Pulse: 65 62 65 73  Resp: 15 15 14 18   Temp: (!) 97.5 F (36.4 C)  98.5 F (36.9 C) 99 F (37.2 C)  TempSrc:    Oral  SpO2: 100% 93% 98% 96%  Weight:      Height:       Physical Exam General: Pleasant obese older female in NAD Heart: S1,S2 No M/R/G Lungs: CTAB Abdomen: obese, NT Extremities: 1+BLE edema. Ace wrap L foot CDI.  Dialysis Access: L AVF + bruit RIJ TDC drsg CDI.     Additional Objective Labs: Basic Metabolic Panel: Recent Labs  Lab 04/05/20 0817 04/05/20 0817 04/06/20 0715 04/06/20 0715 04/08/20 1009 04/08/20 1009 04/09/20 0744 04/10/20 0243 04/11/20 0341  NA 138   < > 134*   < > 133*   < > 137 135 139  K 4.3   < > 4.5   < > 3.9   < > 4.0 4.4 4.0  CL 94*   < > 90*   < > 94*   < > 97* 95* 98  CO2 27   < > 25   < > 23   < > 24 25 25   GLUCOSE 128*   < > 66*   < > 138*   < > 103* 109* 109*  BUN 60*   < > 69*   < > 48*   < > 43* 55* 28*  CREATININE 8.04*   < > 9.14*   < > 7.19*   < > 6.71* 7.77* 5.29*  CALCIUM 8.5*   < > 8.3*   < > 8.0*   < > 8.2* 8.4* 8.3*  PHOS 7.1*  --  7.1*  --  5.7*  --   --   --   --    < > = values in this interval not displayed.   Liver Function Tests: Recent Labs  Lab 04/06/20 0715 04/08/20 1009 04/09/20 0744  AST  --   --  25  ALT  --   --  18  ALKPHOS  --   --  62  BILITOT  --   --  0.3  PROT  --   --  6.2*  ALBUMIN 2.7* 2.3* 2.4*   No results for input(s): LIPASE, AMYLASE in the last 168 hours. CBC: Recent Labs  Lab 04/06/20 2319 04/06/20 2319 04/08/20 1009 04/08/20 1009 04/09/20 0744 04/10/20 0243 04/11/20 0341  WBC 10.8*   < > 7.7   < > 8.5 10.0 12.6*  NEUTROABS  --   --   --   --  6.0  --   --   HGB 8.6*   < > 7.4*   < > 7.7* 7.4* 8.1*  HCT 27.3*   < > 23.5*   < > 25.3* 24.0* 25.8*  MCV 89.8  --   91.4  --  90.0 89.9 89.9  PLT 261   < > 251   < > 269 290 302   < > = values in this interval not displayed.   Blood Culture    Component Value Date/Time   SDES BLOOD RIGHT HAND 04/09/2020 0734   SPECREQUEST  04/09/2020 0734    BOTTLES DRAWN AEROBIC AND ANAEROBIC Blood Culture adequate volume Performed at [facility]  [address] [place], [place] [address]    CULT NO GROWTH 2 DAYS 04/09/2020 0734   REPTSTATUS PENDING 04/09/2020 0734    Cardiac Enzymes: No results for input(s): CKTOTAL, CKMB, CKMBINDEX, TROPONINI in the last 168 hours. CBG: Recent Labs  Lab 04/10/20 2023 04/11/20 0747 04/11/20 0909 04/11/20 1102 04/11/20 1147  GLUCAP 166* 161* 136* 111* 99   Iron Studies: No results for input(s): IRON, TIBC, TRANSFERRIN, FERRITIN in the last 72 hours. @lablastinr3 @ Studies/Results: DG Foot Complete Left  Result Date: 04/09/2020 CLINICAL DATA:  Foot pain and swelling EXAM: LEFT FOOT - COMPLETE 3+ VIEW COMPARISON:  02/01/2020 MRI, 01/31/2020 plain film FINDINGS: There is been interval amputation of the first toe. Persistent soft tissue wound is seen. There is lucency identified in the head of the first metatarsal suspicious for recurrent osteomyelitis. No other fracture or dislocation is seen. No other soft tissue abnormality is noted. IMPRESSION: Changes suspicious for osteomyelitis in the head of the first metatarsal. Overlying soft tissue wound is noted. Electronically Signed   By: Inez Catalina M.D.   On: 04/09/2020 19:40   Medications: .  ceFAZolin (ANCEF) IV 1 g (04/10/20 2152)   . sodium chloride   Intravenous Once  . allopurinol  100 mg Oral Daily  . atorvastatin  40 mg Oral q1800  . buPROPion  150 mg Oral BID  . Chlorhexidine Gluconate Cloth  6 each Topical Daily  . Chlorhexidine Gluconate Cloth  6 each Topical Q0600  . gabapentin  300 mg Oral QHS  . insulin aspart  0-5 Units Subcutaneous QHS  . insulin aspart  0-9 Units Subcutaneous TID WC  . insulin  aspart  2 Units Subcutaneous TID WC  . latanoprost  1 drop Both Eyes QHS  . linaclotide  72 mcg Oral QAC breakfast  . loratadine  10 mg Oral Daily  . metoprolol tartrate  25 mg Oral BID  . pantoprazole  40 mg Oral Daily  . polyethylene glycol  17 g Oral BID  . senna-docusate  1 tablet Oral BID  . sucroferric oxyhydroxide  1,000 mg Oral TID WC     Dialysis Orders: [facility] TTS  4h23min 400/800 118kg 2K/2.5CaRIJTDC  -No heparin -Venofer 100mg  IVq HD until 4/17 -Mircera 276mcg IV q 2 weeks(last 4/1) -Sensipar 30mg  PO TIW -Calcitriol 2.14mcg PO TIW  Assessment/Plan: 1. Recurrent GI bleeding. Hx AVMs. GIconsulted.EGD = [place] AVM "nonbld " , and per GI =pt refuses Colonoscopy Prepx2,Gi signed off now  2. ABLA/Anemia CKD -HGB 6.8 on adm.Hgb7.7 04/09/20.S/P 2 units PRBCs. Recent OP ESA dose. Follow HGB. BC NGTD, WBC 8.5. Continue Fe load. Stop oral iron in HD patient particularly since on ferric binders. 3. Fever on admissionAnd low grade in hosp. 100.4 This am Covid negative. CXR clear. Blood cultures neg to date. Non-healing foot wound following with [facility].Per primarystarted Keflex po for Left LE cellulitis presumed with HO PVD sp L GRT Toe amp 02/09. Has started using AVF, have TDC removed ASAP as OP. ID following  4. Osteomyelitis: ID following. On Ancef. Went for L 1st toe transmetarsal amputation today per Dr. Donzetta Matters today.  5. ESRD -HD TTS.HD 04/15 on schedule. No heparin. Continue to use AVF. Send order for new AVF cannulation protocol, remove TDC when successfully using 15g needles. 6. Hypertension/volume - BP low/stable. Has BLE edema, well above OP EDW. Will attempt to lower volume in HD tomorrow.  7. Metabolic bone disease -Continue binders/Sensipar/Calcitriol for now 8. Nutrition -Renal diet/prot supp 9. Disposition- awaiting to be stable for  discharge to SNF  [place]. [place] NP-C 04/11/2020, 1:53 PM  [facility] [phone]  I have  seen and examined this patient and agree with plan and assessment in the above note with renal recommendations/intervention highlighted.  In good spirits but wants to go home.  Cont with HD using AVF tomorrow. Governor Rooks Paton Crum,MD 04/11/2020 3:41 PM

## 2020-04-11 NOTE — Anesthesia Procedure Notes (Signed)
Procedure Name: LMA Insertion Date/Time: 04/11/2020 10:22 AM Performed by: Orlie Dakin, CRNA Pre-anesthesia Checklist: Patient identified, Emergency Drugs available, Suction available and Patient being monitored Patient Re-evaluated:Patient Re-evaluated prior to induction Oxygen Delivery Method: Circle system utilized Preoxygenation: Pre-oxygenation with 100% oxygen Induction Type: IV induction LMA: LMA inserted LMA Size: 4.0 Tube type: Oral Number of attempts: 1 Placement Confirmation: positive ETCO2 Tube secured with: Tape

## 2020-04-11 NOTE — Anesthesia Preprocedure Evaluation (Signed)
Anesthesia Evaluation  Patient identified by MRN, date of birth, ID band Patient awake    Reviewed: Allergy & Precautions, NPO status , Patient's Chart, lab work & pertinent test results, reviewed documented beta blocker date and time   History of Anesthesia Complications Negative for: history of anesthetic complications  Airway Mallampati: I  TM Distance: >3 FB Neck ROM: Full    Dental  (+) Edentulous Upper, Edentulous Lower   Pulmonary COPD, Current Smoker and Patient abstained from smoking.,  04/03/2020 SARS coronavirus NEG   breath sounds clear to auscultation       Cardiovascular hypertension, Pt. on medications and Pt. on home beta blockers (-) angina+ CAD, + Past MI, + CABG, + Peripheral Vascular Disease and +CHF   Rhythm:Regular Rate:Normal  '20 ECHO: EF 45-50%, mild MR   Neuro/Psych Depression CVA (L sided numbness), Residual Symptoms    GI/Hepatic GERD  Controlled,(+)     substance abuse  cocaine use,   Endo/Other  diabetes (glu 161), Insulin DependentHypothyroidism Morbid obesity  Renal/GU ESRF and DialysisRenal disease (K+ 4.0)     Musculoskeletal   Abdominal (+) + obese,   Peds  Hematology  (+) Blood dyscrasia (Hb 8.1), anemia ,   Anesthesia Other Findings   Reproductive/Obstetrics                             Anesthesia Physical Anesthesia Plan  ASA: III  Anesthesia Plan: General   Post-op Pain Management:    Induction: Intravenous  PONV Risk Score and Plan: 2 and Ondansetron and Dexamethasone  Airway Management Planned: LMA  Additional Equipment:   Intra-op Plan:   Post-operative Plan:   Informed Consent: I have reviewed the patients History and Physical, chart, labs and discussed the procedure including the risks, benefits and alternatives for the proposed anesthesia with the patient or authorized representative who has indicated his/her understanding and  acceptance.       Plan Discussed with: CRNA and Surgeon  Anesthesia Plan Comments:         Anesthesia Quick Evaluation

## 2020-04-12 ENCOUNTER — Inpatient Hospital Stay (HOSPITAL_COMMUNITY): Payer: Medicare (Managed Care)

## 2020-04-12 DIAGNOSIS — R509 Fever, unspecified: Secondary | ICD-10-CM | POA: Diagnosis not present

## 2020-04-12 DIAGNOSIS — I739 Peripheral vascular disease, unspecified: Secondary | ICD-10-CM

## 2020-04-12 DIAGNOSIS — Z89422 Acquired absence of other left toe(s): Secondary | ICD-10-CM

## 2020-04-12 LAB — BASIC METABOLIC PANEL
Anion gap: 17 — ABNORMAL HIGH (ref 5–15)
BUN: 39 mg/dL — ABNORMAL HIGH (ref 8–23)
CO2: 24 mmol/L (ref 22–32)
Calcium: 8.7 mg/dL — ABNORMAL LOW (ref 8.9–10.3)
Chloride: 96 mmol/L — ABNORMAL LOW (ref 98–111)
Creatinine, Ser: 6.62 mg/dL — ABNORMAL HIGH (ref 0.44–1.00)
GFR calc Af Amer: 7 mL/min — ABNORMAL LOW (ref >60)
GFR calc non Af Amer: 6 mL/min — ABNORMAL LOW (ref >60)
Glucose, Bld: 101 mg/dL — ABNORMAL HIGH (ref 70–99)
Potassium: 4.8 mmol/L (ref 3.5–5.1)
Sodium: 137 mmol/L (ref 135–145)

## 2020-04-12 LAB — CBC
HCT: 25.2 % — ABNORMAL LOW (ref 36.0–46.0)
Hemoglobin: 7.8 g/dL — ABNORMAL LOW (ref 12.0–15.0)
MCH: 27.7 pg (ref 26.0–34.0)
MCHC: 31 g/dL (ref 30.0–36.0)
MCV: 89.4 fL (ref 80.0–100.0)
Platelets: 377 10*3/uL (ref 150–400)
RBC: 2.82 MIL/uL — ABNORMAL LOW (ref 3.87–5.11)
RDW: 18.6 % — ABNORMAL HIGH (ref 11.5–15.5)
WBC: 12.6 10*3/uL — ABNORMAL HIGH (ref 4.0–10.5)
nRBC: 0 % (ref 0.0–0.2)

## 2020-04-12 LAB — GLUCOSE, CAPILLARY
Glucose-Capillary: 122 mg/dL — ABNORMAL HIGH (ref 70–99)
Glucose-Capillary: 109 mg/dL — ABNORMAL HIGH (ref 70–99)
Glucose-Capillary: 105 mg/dL — ABNORMAL HIGH (ref 70–99)
Glucose-Capillary: 108 mg/dL — ABNORMAL HIGH (ref 70–99)

## 2020-04-12 MED ORDER — CALCITRIOL 0.5 MCG PO CAPS
2.2500 ug | ORAL_CAPSULE | ORAL | Status: DC
  Administered 2020-04-12 – 2020-04-19 (×4): 2.25 ug via ORAL
  Filled 2020-04-12 (×4): qty 1

## 2020-04-12 MED ORDER — CINACALCET HCL 30 MG PO TABS
30.0000 mg | ORAL_TABLET | ORAL | Status: DC
  Administered 2020-04-12 – 2020-04-19 (×4): 30 mg via ORAL
  Filled 2020-04-12 (×4): qty 1

## 2020-04-12 MED ORDER — DARBEPOETIN ALFA 200 MCG/0.4ML IJ SOSY
200.0000 ug | PREFILLED_SYRINGE | INTRAMUSCULAR | Status: DC
  Filled 2020-04-12: qty 0.4

## 2020-04-12 MED ORDER — CEFAZOLIN SODIUM-DEXTROSE 1-4 GM/50ML-% IV SOLN
1.0000 g | INTRAVENOUS | Status: AC
  Administered 2020-04-12: 1 g via INTRAVENOUS
  Filled 2020-04-12: qty 50

## 2020-04-12 NOTE — Progress Notes (Addendum)
  Progress Note    04/12/2020 7:36 AM 1 Day Post-Op  Subjective:  No complaints   Vitals:   04/12/20 0150 04/12/20 0338  BP: 138/75 (!) 146/90  Pulse: 88 86  Resp: 17 17  Temp: 99.8 F (37.7 C) (!) 101 F (38.3 C)  SpO2: 99% 94%   Physical Exam: Lungs:  Non labored Incisions:  L toe amp site with slow sanguinous oozing, healthy wound bed; R foot with gangrenous toes Neurologic: A&O  CBC    Component Value Date/Time   WBC 12.6 (H) 04/12/2020 0410   RBC 2.82 (L) 04/12/2020 0410   HGB 7.8 (L) 04/12/2020 0410   HCT 25.2 (L) 04/12/2020 0410   HCT 22.0 (L) 10/06/2019 0021   PLT 377 04/12/2020 0410   MCV 89.4 04/12/2020 0410   MCH 27.7 04/12/2020 0410   MCHC 31.0 04/12/2020 0410   RDW 18.6 (H) 04/12/2020 0410   LYMPHSABS 1.0 04/09/2020 0744   MONOABS 1.3 (H) 04/09/2020 0744   EOSABS 0.1 04/09/2020 0744   BASOSABS 0.1 04/09/2020 0744    BMET    Component Value Date/Time   NA 137 04/12/2020 0410   K 4.8 04/12/2020 0410   CL 96 (L) 04/12/2020 0410   CO2 24 04/12/2020 0410   GLUCOSE 101 (H) 04/12/2020 0410   BUN 39 (H) 04/12/2020 0410   CREATININE 6.62 (H) 04/12/2020 0410   CREATININE 2.62 (H) 04/29/2016 1629   CALCIUM 8.7 (L) 04/12/2020 0410   GFRNONAA 6 (L) 04/12/2020 0410   GFRAA 7 (L) 04/12/2020 0410    INR    Component Value Date/Time   INR 1.3 (H) 04/09/2020 0744     Intake/Output Summary (Last 24 hours) at 04/12/2020 0736 Last data filed at 04/12/2020 4360 Gross per 24 hour  Intake 660 ml  Output 20 ml  Net 640 ml     Assessment/Plan:  68 y.o. female is s/p L 1st toe transmet 1 Day Post-Op   Healthy wound bed at amp site; repacked with 4x4, kerlex, and ACE Heel weightbearing only; patient says she has darco shoe from prior surgery Patient will also need RLE arteriogram in the near future   Dagoberto Ligas, [facility] [phone] 04/12/2020 7:36 AM  I have independently interviewed and examined patient and agree  with PA assessment and plan above.   Deylan Canterbury C. Donzetta Matters, MD [facility] [place] Office: [phone] Pager: [phone]

## 2020-04-12 NOTE — Progress Notes (Signed)
Notified Marita [place], RN  At 1620 that HD orders Modified  To 04/13/2020 per DR Justin Mend

## 2020-04-12 NOTE — Plan of Care (Signed)

## 2020-04-12 NOTE — TOC Progression Note (Signed)
Transition of Care [facility]) - Progression Note    Patient Details  Name: [name] [name] MRN: [id number] Date of Birth: 04/11/1951  Transition of Care [facility] CM/SW [place], [place] Phone Number: 04/12/2020, 11:57 AM  Clinical Narrative:    Pt from [facility], her plan is to return to [facility] when stable. Aware of ongoing medical work up. Pt has dialysis and CSW has updated renal navigator [place], LCSW with ongoing care planning.    Expected Discharge Plan: [place] Barriers to Discharge: Continued Medical Work up  Expected Discharge Plan and Services Expected Discharge Plan: [place] In-house Referral: Clinical Social Work Discharge Planning Services: CM Consult Post Acute Care Choice: Resumption of Svcs/PTA Provider, [place] arrangements for the past 2 months: Single Family Home Expected Discharge Date: 04/04/20                Readmission Risk Interventions Readmission Risk Prevention Plan 04/05/2020 12/12/2019 10/08/2019  Transportation Screening Complete Complete -  Medication Review Press photographer) Complete Complete -  PCP or Specialist appointment within 3-5 days of discharge Not Complete Complete -  PCP/Specialist Appt Not Complete comments facility resident - -  [place] or Home Care Consult Complete - -  SW Recovery Care/Counseling Consult Complete - -  Palliative Care Screening Not Applicable - Not Applicable  Skilled Nursing Facility Complete - -  Some recent data might be hidden

## 2020-04-12 NOTE — Progress Notes (Signed)
[facility] for Infectious Disease   Reason for visit: Follow up on fever  Interval History: now s/p left toe amputation by Dr. Donzetta Matters for gangrene.  NGTD on blood cultures.  No new complaints.     Physical Exam: Constitutional:  Vitals:   04/12/20 0338 04/12/20 0958  BP: (!) 146/90 (!) 88/44  Pulse: 86 75  Resp: 17 18  Temp: (!) 101 F (38.3 C) 98.9 F (37.2 C)  SpO2: 94% 100%   patient appears in NAD Respiratory: Normal respiratory effort; CTA B Cardiovascular: RRR GI: soft, nt, nd  Review of Systems: Constitutional: negative for fevers and chills Gastrointestinal: negative for nausea and diarrhea  Lab Results  Component Value Date   WBC 12.6 (H) 04/12/2020   HGB 7.8 (L) 04/12/2020   HCT 25.2 (L) 04/12/2020   MCV 89.4 04/12/2020   PLT 377 04/12/2020    Lab Results  Component Value Date   CREATININE 6.62 (H) 04/12/2020   BUN 39 (H) 04/12/2020   NA 137 04/12/2020   K 4.8 04/12/2020   CL 96 (L) 04/12/2020   CO2 24 04/12/2020    Lab Results  Component Value Date   ALT 18 04/09/2020   AST 25 04/09/2020   ALKPHOS 62 04/09/2020     Microbiology: Recent Results (from the past 240 hour(s))  SARS CORONAVIRUS 2 (TAT 6-24 HRS) Nasopharyngeal Nasopharyngeal Swab     Status: None   Collection Time: 04/03/20  7:00 PM   Specimen: Nasopharyngeal Swab  Result Value Ref Range Status   SARS Coronavirus 2 NEGATIVE NEGATIVE Final    Comment: (NOTE) SARS-CoV-2 target nucleic acids are NOT DETECTED. The SARS-CoV-2 RNA is generally detectable in upper and lower respiratory specimens during the acute phase of infection. Negative results do not preclude SARS-CoV-2 infection, do not rule out co-infections with other pathogens, and should not be used as the sole basis for treatment or other patient management decisions. Negative results must be combined with clinical observations, patient history, and epidemiological information. The expected result is Negative. Fact  Sheet for Patients: SugarRoll.be Fact Sheet for Healthcare Providers: https://www.woods-mathews.com/ This test is not yet approved or cleared by the [place] FDA and  has been authorized for detection and/or diagnosis of SARS-CoV-2 by FDA under an Emergency Use Authorization (EUA). This EUA will remain  in effect (meaning this test can be used) for the duration of the COVID-19 declaration under Section 56 4(b)(1) of the Act, 21 U.S.C. section 360bbb-3(b)(1), unless the authorization is terminated or revoked sooner. Performed at [facility], [place] [address] [place], [place] [address]   Culture, blood (Routine X 2) w Reflex to ID Panel     Status: None   Collection Time: 04/04/20  5:20 PM   Specimen: BLOOD  Result Value Ref Range Status   Specimen Description   Final    BLOOD WRIST RIGHT Performed at [facility] [address], [place], [place] [address]    Special Requests   Final    BOTTLES DRAWN AEROBIC AND ANAEROBIC Blood Culture adequate volume Performed at [facility] [address], [place], [place] [address]    Culture   Final    NO GROWTH 5 DAYS Performed at [facility], [place] [address], [place], [place] [address]    Report Status 04/09/2020 FINAL  Final  Culture, blood (Routine X 2) w Reflex to ID Panel     Status: None   Collection Time: 04/04/20  5:20 PM   Specimen: BLOOD RIGHT HAND  Result Value Ref Range Status   Specimen Description   Final    BLOOD RIGHT HAND Performed at [facility] [address], [place], [place] [address]    Special Requests   Final    BOTTLES DRAWN AEROBIC AND ANAEROBIC Blood Culture adequate volume Performed at [facility] [address], [place], [place] [address]    Culture   Final    NO GROWTH 5 DAYS Performed at [facility], [place] [address] [place], [place] [address]    Report Status  04/09/2020 FINAL  Final  Surgical pcr screen     Status: None   Collection Time: 04/05/20 11:54 AM   Specimen: Nasal Mucosa; Nasal Swab  Result Value Ref Range Status   MRSA, PCR NEGATIVE NEGATIVE Final   Staphylococcus aureus NEGATIVE NEGATIVE Final    Comment: (NOTE) The Xpert SA Assay (FDA approved for NASAL specimens in patients 28 years of age and older), is one component of a comprehensive surveillance program. It is not intended to diagnose infection nor to guide or monitor treatment. Performed at [facility], [place] [address] [place], [place] [address]   Culture, blood (routine x 2)     Status: None (Preliminary result)   Collection Time: 04/09/20  7:28 AM   Specimen: BLOOD  Result Value Ref Range Status   Specimen Description BLOOD RIGHT ANTECUBITAL  Final   Special Requests   Final    BOTTLES DRAWN AEROBIC AND ANAEROBIC Blood Culture adequate volume Performed at [facility], [place] [address] [place], [place] [address]    Culture NO GROWTH 2 DAYS  Final   Report Status PENDING  Incomplete  Culture, blood (routine x 2)     Status: None (Preliminary result)   Collection Time: 04/09/20  7:34 AM   Specimen: BLOOD RIGHT HAND  Result Value Ref Range Status   Specimen Description BLOOD RIGHT HAND  Final   Special Requests   Final    BOTTLES DRAWN AEROBIC AND ANAEROBIC Blood Culture adequate volume Performed at [facility], [place] [address], [place],  [address]    Culture NO GROWTH 2 DAYS  Final   Report Status PENDING  Incomplete  Surgical pcr screen     Status: None   Collection Time: 04/10/20  6:10 PM   Specimen: Nasal Mucosa; Nasal Swab  Result Value Ref Range Status   MRSA, PCR NEGATIVE NEGATIVE Final   Staphylococcus aureus NEGATIVE NEGATIVE Final    Comment: (NOTE) The Xpert SA Assay (FDA approved for NASAL specimens in patients 34 years of age and older), is one component of a comprehensive surveillance program. It is not intended to  diagnose infection nor to guide or monitor treatment. Performed at [facility], [place] [address], [place],  [address]     Impression/Plan:  1. Fever - some fever still but trending down and now s/p definitive therapy of her toe which is most c/w the cause.  I will give one further dose of Ancef after dialysis today then can stop antibiotics.  2. PVD - to get further evaluation of the right side in the near future.    I will sign off, call with questions.

## 2020-04-12 NOTE — Progress Notes (Signed)
[facility] Progress Note  Patient: [name] [name]    ERX:[id number]  DOA: 04/03/2020     Date of Service: the patient was seen and examined on 04/12/2020  Chief Complaint  Patient presents with  . Abnormal Lab   Brief hospital course: Past medical history of ESRD on HD, CAD S/P CABG, combined CHF, type II DM, CVA.  Recent anemia.  Patient presented with complaints of abnormal hemoglobin from hemodialysis.  She was transfused PRBC. After that she started having fever and tachycardia.  Patient was transferred to [facility] for hemodialysis need and continuous observation for her sepsis-like picture. Patient continues to have low hemoglobin and therefore GI was consulted.  Patient has refused bowel prep x2 so far. GI has signed off. 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  Also found to have foot osteomyelitis needing surgical resection. 4/14, patient underwent left great toe amputation and revision to the healthy margins.  Blood cultures negative. 4/15, temperature overnight.  Extremely tender right foot with swelling. CT scan with diffuse cellulitis, no localized collection and no evidence of osteomyelitis.  Assessment and Plan: 1.  Lower GI bleed S/P EGD, duodenal AVM treated with APC Primary reason for patient's presentation.  Patient reported hematochezia. S/P 2 PRBC.  Stabilized since then. Patient has refused colonoscopy prep x2. Not a candidate for capsule endoscopy given no bowel prep. Remains at risk for further GI bleed. Prior colonoscopy last February was suggestive of an AVM treated with clips. Hemoglobin trending down but now stabilizing around 8.  Monitor.  No active bleeding reported by the patient as well.  2.  ESRD on HD Admitted to [facility] for ESRD. Continue HD appreciate nephrology input.  Getting dialysis on her schedule.  3.  History of combined chronic CHF Currently euvolemic.  Monitor.  4.  Type 2 diabetes mellitus with renal  complication.  Uncontrolled with hyperglycemia Currently on sliding scale insulin.  Well-controlled.  5.  Essential hypertension. Blood pressure stable.  Monitor.  6.  Osteomyelitis of the foot. Nonhealing left leg ulcer. PVD. Blood cultures negative. ID consulted. X-ray shows evidence of osteomyelitis of the foot. Patient underwent surgical procedure with revision of left great toe amputation 4/14.Continue IV antibiotics with HD. Patient has new development of pain and swelling of the right foot.  Fever overnight.  Redraw blood cultures with hemodialysis today. Order CT scan and reviewed that showed diffuse swelling and edema with no collectible abscess.  No osteomyelitis. With continued fever and tenderness, may benefit with ongoing antibiotic use. Updated surgery to review right foot. Updated infectious disease to reconsider prolonging antibiotics.  7. Morbid obesity. Body mass index is 40.67 kg/m.   Diet: Renal diet DVT Prophylaxis: SCD, pharmacological prophylaxis contraindicated due to Due to GI bleed   Advance goals of care discussion: Full code  Family Communication: Patient talking to family.  Disposition:  For dialysis today.  Continues to have fever.  Subjective: Patient seen and examined.  Overnight temperature 101.  On examination of the foot, extremely tender right foot with some open drainage on the plantar aspect.  Physical Exam: Physical Exam  Constitutional:  Not in any distress.  Chronically sick looking.  On room air.  HENT:  Head: Normocephalic.  Cardiovascular: Normal rate and regular rhythm.  Respiratory:  Right chest wall permacath present.  Musculoskeletal:     Cervical back: Neck supple.     Comments: Left foot on immediate postop dressing, removed by surgery and reported well and dry.  Right foot with swelling, circumferential swelling both plantar and dorsal aspect, small draining sloughed off skin on the posterior aspect.  Extremely tender to  touch.  Difficult to appreciate change in color, however with no localized fluctuation but generalized swelling.    Vitals:   04/12/20 0150 04/12/20 0338 04/12/20 0958 04/12/20 1056  BP: 138/75 (!) 146/90 (!) 88/44 118/60  Pulse: 88 86 75   Resp: 17 17 18    Temp: 99.8 F (37.7 C) (!) 101 F (38.3 C) 98.9 F (37.2 C)   TempSrc: Oral Oral Oral   SpO2: 99% 94% 100%   Weight:      Height:        Intake/Output Summary (Last 24 hours) at 04/12/2020 1139 Last data filed at 04/12/2020 0930 Gross per 24 hour  Intake 480 ml  Output 0 ml  Net 480 ml   Filed Weights   04/10/20 0720 04/10/20 1140 04/11/20 0904  Weight: 122.5 kg 121.3 kg 121.3 kg    Data Reviewed: I have personally reviewed and interpreted daily labs, tele strips, imagings as discussed above. I reviewed all nursing notes, pharmacy notes, vitals, pertinent old records I have discussed plan of care as described above with RN and patient/family.  CBC: Recent Labs  Lab 04/08/20 1009 04/09/20 0744 04/10/20 0243 04/11/20 0341 04/12/20 0410  WBC 7.7 8.5 10.0 12.6* 12.6*  NEUTROABS  --  6.0  --   --   --   HGB 7.4* 7.7* 7.4* 8.1* 7.8*  HCT 23.5* 25.3* 24.0* 25.8* 25.2*  MCV 91.4 90.0 89.9 89.9 89.4  PLT 251 269 290 302 071   Basic Metabolic Panel: Recent Labs  Lab 04/06/20 0715 04/06/20 0715 04/08/20 1009 04/09/20 0744 04/10/20 0243 04/11/20 0341 04/12/20 0410  NA 134*   < > 133* 137 135 139 137  K 4.5   < > 3.9 4.0 4.4 4.0 4.8  CL 90*   < > 94* 97* 95* 98 96*  CO2 25   < > 23 24 25 25 24   GLUCOSE 66*   < > 138* 103* 109* 109* 101*  BUN 69*   < > 48* 43* 55* 28* 39*  CREATININE 9.14*   < > 7.19* 6.71* 7.77* 5.29* 6.62*  CALCIUM 8.3*   < > 8.0* 8.2* 8.4* 8.3* 8.7*  PHOS 7.1*  --  5.7*  --   --   --   --    < > = values in this interval not displayed.    Studies: CT FOOT RIGHT WO CONTRAST  Result Date: 04/12/2020 CLINICAL DATA:  Pain and swelling. EXAM: CT OF THE RIGHT FOOT WITHOUT CONTRAST TECHNIQUE:  Multidetector CT imaging of the right foot was performed according to the standard protocol. Multiplanar CT image reconstructions were also generated. COMPARISON:  Radiographs 04/09/2020 FINDINGS: Diffuse subcutaneous soft tissue swelling/edema/fluid involving the entire ankle and foot. No focal fluid collection to suggest a drainable soft tissue abscess. Marked fatty atrophy of the foot and ankle musculature. Suspect myofasciitis involving the plantar foot musculature but no definite findings for pyomyositis. Suspect an open wound along the plantar aspect of the great toe but I do not see any obvious destructive bony changes to suggest osteomyelitis. No definite findings for septic arthritis. The other bony structures of the foot and ankle appear intact. No obvious destructive bony changes, fractures or osteochondral lesions. Extensive small artery calcifications. IMPRESSION: 1. Diffuse subcutaneous soft tissue swelling/edema/fluid involving the entire ankle and foot consistent with cellulitis. No focal fluid collection  to suggest a drainable soft tissue abscess. 2. Suspect myofasciitis involving the plantar foot musculature but no definite findings for pyomyositis. Severe diffuse fatty atrophy of the foot and ankle musculature. 3. Suspect an open wound along the plantar aspect of the great toe but I do not see any obvious destructive bony changes to suggest osteomyelitis. No definite findings for septic arthritis. 4. If symptoms persist or worsen MRI is suggested for further evaluation. Electronically Signed   By: Marijo Sanes M.D.   On: 04/12/2020 10:51    Scheduled Meds: . sodium chloride   Intravenous Once  . allopurinol  100 mg Oral Daily  . atorvastatin  40 mg Oral q1800  . buPROPion  150 mg Oral BID  . calcitRIOL  2.25 mcg Oral Q T,Th,Sat-1800  . Chlorhexidine Gluconate Cloth  6 each Topical Daily  . Chlorhexidine Gluconate Cloth  6 each Topical Q0600  . cinacalcet  30 mg Oral Q T,Th,Sat-1800  .  darbepoetin (ARANESP) injection - DIALYSIS  200 mcg Intravenous Q Thu-HD  . gabapentin  300 mg Oral QHS  . insulin aspart  0-5 Units Subcutaneous QHS  . insulin aspart  0-9 Units Subcutaneous TID WC  . insulin aspart  2 Units Subcutaneous TID WC  . latanoprost  1 drop Both Eyes QHS  . linaclotide  72 mcg Oral QAC breakfast  . loratadine  10 mg Oral Daily  . metoprolol tartrate  25 mg Oral BID  . pantoprazole  40 mg Oral Daily  . polyethylene glycol  17 g Oral BID  . senna-docusate  1 tablet Oral BID  . sucroferric oxyhydroxide  1,000 mg Oral TID WC   Continuous Infusions: .  ceFAZolin (ANCEF) IV     PRN Meds: acetaminophen, albuterol, mometasone-formoterol, nitroGLYCERIN, oxyCODONE  Time spent: 25 minutes

## 2020-04-12 NOTE — Progress Notes (Addendum)
[facility] Progress Note   Subjective: seen in room, eating breakfast. R foot still painful otherwise no C/Os. For HD today.   Objective Vitals:   04/11/20 1832 04/11/20 2010 04/12/20 0150 04/12/20 0338  BP: 108/62 140/76 138/75 (!) 146/90  Pulse: 74 89 88 86  Resp: 18 17 17 17   Temp: 99.4 F (37.4 C) 99.1 F (37.3 C) 99.8 F (37.7 C) (!) 101 F (38.3 C)  TempSrc: Oral Oral Oral Oral  SpO2: 100% 100% 99% 94%  Weight:      Height:       Physical Exam General:Pleasant obese older female in NAD Heart:S1,S2 No M/R/G Lungs:CTAB Abdomen:obese, NT Extremities:1+BLE edema. Ace wrap L foot CDI.  Dialysis Access:L AVF + bruit RIJ TDC drsg CDI.    Additional Objective Labs: Basic Metabolic Panel: Recent Labs  Lab 04/06/20 0715 04/06/20 0715 04/08/20 1009 04/09/20 0744 04/10/20 0243 04/11/20 0341 04/12/20 0410  NA 134*   < > 133*   < > 135 139 137  K 4.5   < > 3.9   < > 4.4 4.0 4.8  CL 90*   < > 94*   < > 95* 98 96*  CO2 25   < > 23   < > 25 25 24   GLUCOSE 66*   < > 138*   < > 109* 109* 101*  BUN 69*   < > 48*   < > 55* 28* 39*  CREATININE 9.14*   < > 7.19*   < > 7.77* 5.29* 6.62*  CALCIUM 8.3*   < > 8.0*   < > 8.4* 8.3* 8.7*  PHOS 7.1*  --  5.7*  --   --   --   --    < > = values in this interval not displayed.   Liver Function Tests: Recent Labs  Lab 04/06/20 0715 04/08/20 1009 04/09/20 0744  AST  --   --  25  ALT  --   --  18  ALKPHOS  --   --  62  BILITOT  --   --  0.3  PROT  --   --  6.2*  ALBUMIN 2.7* 2.3* 2.4*   No results for input(s): LIPASE, AMYLASE in the last 168 hours. CBC: Recent Labs  Lab 04/08/20 1009 04/08/20 1009 04/09/20 0744 04/09/20 0744 04/10/20 0243 04/11/20 0341 04/12/20 0410  WBC 7.7   < > 8.5   < > 10.0 12.6* 12.6*  NEUTROABS  --   --  6.0  --   --   --   --   HGB 7.4*   < > 7.7*   < > 7.4* 8.1* 7.8*  HCT 23.5*   < > 25.3*   < > 24.0* 25.8* 25.2*  MCV 91.4  --  90.0  --  89.9 89.9 89.4  PLT 251    < > 269   < > 290 302 377   < > = values in this interval not displayed.   Blood Culture    Component Value Date/Time   SDES BLOOD RIGHT HAND 04/09/2020 0734   SPECREQUEST  04/09/2020 0734    BOTTLES DRAWN AEROBIC AND ANAEROBIC Blood Culture adequate volume Performed at [facility] [address] [place], [place] [address]    CULT NO GROWTH 2 DAYS 04/09/2020 0734   REPTSTATUS PENDING 04/09/2020 0734    Cardiac Enzymes: No results for input(s): CKTOTAL, CKMB, CKMBINDEX, TROPONINI in the last 168 hours. CBG: Recent Labs  Lab  04/11/20 1102 04/11/20 1147 04/11/20 1707 04/11/20 2007 04/12/20 0745  GLUCAP 111* 99 145* 83 122*   Iron Studies: No results for input(s): IRON, TIBC, TRANSFERRIN, FERRITIN in the last 72 hours. @lablastinr3 @ Studies/Results: No results found. Medications: .  ceFAZolin (ANCEF) IV 1 g (04/11/20 2028)   . sodium chloride   Intravenous Once  . allopurinol  100 mg Oral Daily  . atorvastatin  40 mg Oral q1800  . buPROPion  150 mg Oral BID  . Chlorhexidine Gluconate Cloth  6 each Topical Daily  . Chlorhexidine Gluconate Cloth  6 each Topical Q0600  . gabapentin  300 mg Oral QHS  . insulin aspart  0-5 Units Subcutaneous QHS  . insulin aspart  0-9 Units Subcutaneous TID WC  . insulin aspart  2 Units Subcutaneous TID WC  . latanoprost  1 drop Both Eyes QHS  . linaclotide  72 mcg Oral QAC breakfast  . loratadine  10 mg Oral Daily  . metoprolol tartrate  25 mg Oral BID  . pantoprazole  40 mg Oral Daily  . polyethylene glycol  17 g Oral BID  . senna-docusate  1 tablet Oral BID  . sucroferric oxyhydroxide  1,000 mg Oral TID WC     Dialysis Orders: [facility] TTS  4h24min 400/800 118kg 2K/2.5CaRIJTDC  -No heparin -Venofer 100mg  IVq HD until 4/17 -Mircera 232mcg IV q 2 weeks(last 4/1) -Sensipar 30mg  PO TIW -Calcitriol 2.63mcg PO TIW  Assessment/Plan: 1. Recurrent GI bleeding. Hx AVMs. GIconsulted.EGD = Duoden AVM "nonbld " , and  per GI =pt refuses Colonoscopy Prepx2,Gi signed off now  2. ABLA/Anemia CKD -HGB 6.8 on adm.Hgb7.8 04/12/20.S/P 2 units PRBCs. Follow HGB. Give Aranesp 200 mcg IV in HD today.  3. Osteomyelitis: ID following. On Ancef. Went for L 1st toe transmetarsal amputation today per Dr. Donzetta Matters today. ID following, on Ancef. WBC 12.6 BC NGTD X 2 days.  4. ESRD -HD TTS.HD 04/15 on schedule. No heparin. Continue to use AVF. Send order for new AVF cannulation protocol, remove TDC when successfully using 15g needles. 5. Hypertension/volume - BP low/stable. Has BLE edema, well above OP EDW. Will attempt to lower volume in HD today.  6. Metabolic bone disease -Continue binders/Sensipar/Calcitriol for now 7. Nutrition -Renal diet/prot supp 8. Disposition- awaiting to be stable for discharge to SNF  Rita H. Brown NP-C 04/12/2020, 8:58 AM  [employer] [phone]  I have seen and examined this patient and agree with plan and assessment in the above note with renal recommendations/intervention highlighted.  Hopeful discharge to SNF tomorrow.  Broadus John A Ramond Darnell,MD 04/12/2020 12:20 PM

## 2020-04-12 NOTE — Progress Notes (Signed)
Pharmacy Antibiotic Note  [name] [name] is a 69 y.o. female admitted on 04/03/2020 with Osteomyelitis.  Pharmacy has been consulted for cefazolin dosing.  Tmax 101, WBC 12.6. POD 1 L Toe amputation and revision. Per MD note, L toe amp site with slow sanguinous oozing, healthy wound bed; R foot with gangrenous toes. Pending CT of R foot.   Plan: Continue cefazolin 1 g IV every 24 hours Monitor HD schedule/tolerance, CT of R foot, LOT  F/u ID recs   Height: 5' 7.99" (172.7 cm) Weight: 121.3 kg (267 lb 6.7 oz) IBW/kg (Calculated) : 63.88  Temp (24hrs), Avg:99.1 F (37.3 C), Min:97.5 F (36.4 C), Max:101 F (38.3 C)  Recent Labs  Lab 04/08/20 1009 04/09/20 0744 04/09/20 1034 04/10/20 0243 04/11/20 0341 04/12/20 0410  WBC 7.7 8.5  --  10.0 12.6* 12.6*  CREATININE 7.19* 6.71*  --  7.77* 5.29* 6.62*  LATICACIDVEN  --  1.2 1.4  --   --   --     Estimated Creatinine Clearance: 11 mL/min (A) (by C-G formula based on SCr of 6.62 mg/dL (H)).     Antimicrobials this admission: Cefazolin 4/12 >>  Cephalexin 4/10 >> 4/11   Microbiology results: 4/7 BCx: ngtd 4/12 BCx: ngtd 4/8 MRSA PCR: neg  Thank you for allowing pharmacy to be a part of this patient's care.  Benetta Spar, PharmD, BCPS, BCCP Clinical Pharmacist  Please check AMION for all [place] phone numbers After 10:00 PM, call [place] [phone]

## 2020-04-13 LAB — CBC
HCT: 21.9 % — ABNORMAL LOW (ref 36.0–46.0)
Hemoglobin: 6.8 g/dL — CL (ref 12.0–15.0)
MCH: 27.1 pg (ref 26.0–34.0)
MCHC: 31.1 g/dL (ref 30.0–36.0)
MCV: 87.3 fL (ref 80.0–100.0)
Platelets: 352 10*3/uL (ref 150–400)
RBC: 2.51 MIL/uL — ABNORMAL LOW (ref 3.87–5.11)
RDW: 18.8 % — ABNORMAL HIGH (ref 11.5–15.5)
WBC: 9.5 10*3/uL (ref 4.0–10.5)
nRBC: 0 % (ref 0.0–0.2)

## 2020-04-13 LAB — BASIC METABOLIC PANEL
Anion gap: 15 (ref 5–15)
BUN: 49 mg/dL — ABNORMAL HIGH (ref 8–23)
CO2: 24 mmol/L (ref 22–32)
Calcium: 8 mg/dL — ABNORMAL LOW (ref 8.9–10.3)
Chloride: 97 mmol/L — ABNORMAL LOW (ref 98–111)
Creatinine, Ser: 7.66 mg/dL — ABNORMAL HIGH (ref 0.44–1.00)
GFR calc Af Amer: 6 mL/min — ABNORMAL LOW (ref >60)
GFR calc non Af Amer: 5 mL/min — ABNORMAL LOW (ref >60)
Glucose, Bld: 144 mg/dL — ABNORMAL HIGH (ref 70–99)
Potassium: 4.9 mmol/L (ref 3.5–5.1)
Sodium: 136 mmol/L (ref 135–145)

## 2020-04-13 LAB — PREPARE RBC (CROSSMATCH)

## 2020-04-13 LAB — GLUCOSE, CAPILLARY
Glucose-Capillary: 103 mg/dL — ABNORMAL HIGH (ref 70–99)
Glucose-Capillary: 139 mg/dL — ABNORMAL HIGH (ref 70–99)
Glucose-Capillary: 215 mg/dL — ABNORMAL HIGH (ref 70–99)

## 2020-04-13 MED ORDER — DARBEPOETIN ALFA 200 MCG/0.4ML IJ SOSY
PREFILLED_SYRINGE | INTRAMUSCULAR | Status: AC
  Filled 2020-04-13: qty 0.4

## 2020-04-13 MED ORDER — SODIUM CHLORIDE 0.9% IV SOLUTION: Freq: Once | INTRAVENOUS | Status: AC

## 2020-04-13 MED ORDER — DARBEPOETIN ALFA 200 MCG/0.4ML IJ SOSY: 200.0000 ug | PREFILLED_SYRINGE | INTRAMUSCULAR | Status: DC

## 2020-04-13 MED ORDER — DARBEPOETIN ALFA 200 MCG/0.4ML IJ SOSY
200.0000 ug | PREFILLED_SYRINGE | INTRAMUSCULAR | Status: AC
  Administered 2020-04-13: 200 ug via INTRAVENOUS

## 2020-04-13 MED ORDER — OXYCODONE HCL 5 MG PO TABS
ORAL_TABLET | ORAL | Status: AC
  Filled 2020-04-13: qty 1

## 2020-04-13 NOTE — Progress Notes (Addendum)
[facility]  Subjective  - Doing well.   Objective (!) 123/52 (!) 57 98.3 F (36.8 C) (Oral) 17 94%  Intake/Output Summary (Last 24 hours) at 04/13/2020 0739 Last data filed at 04/12/2020 2132 Gross per 24 hour  Intake 560 ml  Output --  Net 560 ml    Left foot dressing clean and dry.  Dressing not changed. Lungs non labored breathing Right foot with dry gangrene, no change   Assessment/Planning: POD # 2   69 y.o. female is s/p L 1st toe transmet  Patient currently in HD Will keep dressing in place until tomorrow Pending angiogram for right LE HGB 6.8 asymptomatic, transfusions per primary team.  Roxy Horseman 04/13/2020 7:39 AM --  Laboratory Lab Results: Recent Labs    04/12/20 0410 04/13/20 0209  WBC 12.6* 9.5  HGB 7.8* 6.8*  HCT 25.2* 21.9*  PLT 377 352   BMET Recent Labs    04/12/20 0410 04/13/20 0209  NA 137 136  K 4.8 4.9  CL 96* 97*  CO2 24 24  GLUCOSE 101* 144*  BUN 39* 49*  CREATININE 6.62* 7.66*  CALCIUM 8.7* 8.0*    COAG Lab Results  Component Value Date   INR 1.3 (H) 04/09/2020   INR 1.2 04/03/2020   INR 1.2 08/25/2019   No results found for: PTT  I have independently interviewed and examined patient and agree with PA assessment and plan above. Will need to eval rle at some point but currently focusing on left great toe amp site. Change dressing tomorrow.   Dalaysia Harms C. Donzetta Matters, MD [facility] Office: [phone] Pager: [phone]

## 2020-04-13 NOTE — Procedures (Signed)
I was present at this dialysis session. I have reviewed the session itself and made appropriate changes.   Vital signs in last 24 hours:  Temp:  [98.3 F (36.8 C)-99.7 F (37.6 C)] 98.8 F (37.1 C) (04/16 0853) Pulse Rate:  [53-106] 96 (04/16 0853) Resp:  [16-18] 17 (04/16 0853) BP: (88-158)/(43-107) 134/107 (04/16 0853) SpO2:  [94 %-100 %] 94 % (04/16 0853) Weight:  [122.5 kg] 122.5 kg (04/16 0700) Weight change: 1.2 kg Filed Weights   04/10/20 1140 04/11/20 0904 04/13/20 0700  Weight: 121.3 kg 121.3 kg 122.5 kg    Recent Labs  Lab 04/08/20 1009 04/09/20 0744 04/13/20 0209  NA 133*   < > 136  K 3.9   < > 4.9  CL 94*   < > 97*  CO2 23   < > 24  GLUCOSE 138*   < > 144*  BUN 48*   < > 49*  CREATININE 7.19*   < > 7.66*  CALCIUM 8.0*   < > 8.0*  PHOS 5.7*  --   --    < > = values in this interval not displayed.    Recent Labs  Lab 04/09/20 0744 04/10/20 0243 04/11/20 0341 04/12/20 0410 04/13/20 0209  WBC 8.5   < > 12.6* 12.6* 9.5  NEUTROABS 6.0  --   --   --   --   HGB 7.7*   < > 8.1* 7.8* 6.8*  HCT 25.3*   < > 25.8* 25.2* 21.9*  MCV 90.0   < > 89.9 89.4 87.3  PLT 269   < > 302 377 352   < > = values in this interval not displayed.    Scheduled Meds: . sodium chloride   Intravenous Once  . allopurinol  100 mg Oral Daily  . atorvastatin  40 mg Oral q1800  . buPROPion  150 mg Oral BID  . calcitRIOL  2.25 mcg Oral Q T,Th,Sat-1800  . Chlorhexidine Gluconate Cloth  6 each Topical Daily  . Chlorhexidine Gluconate Cloth  6 each Topical Q0600  . cinacalcet  30 mg Oral Q T,Th,Sat-1800  . darbepoetin (ARANESP) injection - DIALYSIS  200 mcg Intravenous Q Fri-HD  . gabapentin  300 mg Oral QHS  . insulin aspart  0-5 Units Subcutaneous QHS  . insulin aspart  0-9 Units Subcutaneous TID WC  . insulin aspart  2 Units Subcutaneous TID WC  . latanoprost  1 drop Both Eyes QHS  . linaclotide  72 mcg Oral QAC breakfast  . loratadine  10 mg Oral Daily  . metoprolol tartrate   25 mg Oral BID  . pantoprazole  40 mg Oral Daily  . polyethylene glycol  17 g Oral BID  . senna-docusate  1 tablet Oral BID  . sucroferric oxyhydroxide  1,000 mg Oral TID WC   Continuous Infusions: PRN Meds:.acetaminophen, albuterol, mometasone-formoterol, nitroGLYCERIN, oxyCODONE    Dialysis Orders: [facility] TTS  4h24min 400/800 118kg 2K/2.5CaRIJTDC  -No heparin -Venofer 100mg  IVq HD until 4/17 -Mircera 248mcg IV q 2 weeks(last 4/1) -Sensipar 30mg  PO TIW -Calcitriol 2.30mcg PO TIW  Assessment/Plan: 1. Recurrent GI bleeding. Hx AVMs. GIconsulted.EGD = Duoden AVM s/p APC.  Pt refused colonoscopy x 2.   2. ABLA/Anemia CKD -HGB 6.8 on adm.Hgb7.8 04/12/20.S/P 2 units PRBCs. HGB back down to 6.8. Give Aranesp 200 mcg IV in HD today.  1. Receiving blood with HD today 3. Osteomyelitis: ID following. On Ancef. Went for L 1st toe transmetarsal amputation today per Dr. Donzetta Matters today.ID  following, on Ancef. WBC 12.6 BC NGTD X 2 days.  4. ESRD -HD TTS.HD 04/15 on schedule. No heparin.Continue to use AVF. Send order for new AVF cannulation protocol, remove TDC when successfully using 15g needles. 5. Hypertension/volume - BP low/stable.Has BLE edema, well above OP EDW. Will attempt to lower volume in HD today. 6. Metabolic bone disease -Continue binders/Sensipar/Calcitriol for now 7. Nutrition -Renal diet/prot supp 8. Diabetic foot uclers- now with shallow ulcer on base of right great toe.  Will need some wound care on that prior to discharge.  9. Disposition- awaiting to be stable for discharge to SNF   Donetta Potts,  MD 04/13/2020, 9:09 AM

## 2020-04-13 NOTE — Progress Notes (Signed)
PT Cancellation Note  Patient Details Name: [name] [name] MRN: [id number] DOB: Jan 19, 1951   Cancelled Treatment:    Reason Eval/Treat Not Completed: Patient at procedure or test/unavailable (HD). Will follow-up for PT treatment as schedule permits.  Mabeline Caras, PT, DPT Acute Rehabilitation Services  Pager [phone] Office [place] 04/13/2020, 7:31 AM

## 2020-04-13 NOTE — Plan of Care (Signed)
  Problem: Education: Goal: Knowledge of disease and its progression will improve Outcome: Progressing Goal: Individualized Educational Video(s) Outcome: Progressing   Problem: Fluid Volume: Goal: Compliance with measures to maintain balanced fluid volume will improve Outcome: Progressing   Problem: Health Behavior/Discharge Planning: Goal: Ability to manage health-related needs will improve Outcome: Progressing   Problem: Nutritional: Goal: Ability to make healthy dietary choices will improve Outcome: Progressing   Problem: Clinical Measurements: Goal: Complications related to the disease process, condition or treatment will be avoided or minimized Outcome: Progressing   Problem: Education: Goal: Knowledge of General Education information will improve Description: Including pain rating scale, medication(s)/side effects and non-pharmacologic comfort measures Outcome: Progressing   Problem: Health Behavior/Discharge Planning: Goal: Ability to manage health-related needs will improve Outcome: Progressing   Problem: Clinical Measurements: Goal: Ability to maintain clinical measurements within normal limits will improve Outcome: Progressing Goal: Will remain free from infection Outcome: Progressing Goal: Diagnostic test results will improve Outcome: Progressing Goal: Respiratory complications will improve Outcome: Progressing Goal: Cardiovascular complication will be avoided Outcome: Progressing   Problem: Activity: Goal: Risk for activity intolerance will decrease Outcome: Progressing   Problem: Nutrition: Goal: Adequate nutrition will be maintained Outcome: Progressing   Problem: Coping: Goal: Level of anxiety will decrease Outcome: Progressing   Problem: Elimination: Goal: Will not experience complications related to bowel motility Outcome: Progressing Goal: Will not experience complications related to urinary retention Outcome: Progressing   Problem: Pain  Managment: Goal: General experience of comfort will improve Outcome: Progressing   Problem: Safety: Goal: Ability to remain free from injury will improve Outcome: Progressing   Problem: Skin Integrity: Goal: Risk for impaired skin integrity will decrease Outcome: Progressing   

## 2020-04-13 NOTE — Progress Notes (Addendum)
CRITICAL VALUE ALERT  Critical Value:  Hgb=6.8  Date & Time Notied:  04/13/20; 0335  Provider Notified: Dr. Silas [place]  Orders Received/Actions taken: Awaiting reply/order   Resend text paged to Dr. Silas Sacramento at [phone]. Awaiting reply/order.

## 2020-04-13 NOTE — Progress Notes (Signed)
[facility] Progress Note  Patient: [name] [name]    BHA:[id number]  DOA: 04/03/2020     Date of Service: the patient was seen and examined on 04/13/2020  Chief Complaint  Patient presents with  . Abnormal Lab   Brief hospital course: Past medical history of ESRD on HD, CAD S/P CABG, combined CHF, type II DM, CVA.  Recurrent anemia.  Patient presented with complaints of abnormal hemoglobin from hemodialysis.  She was transfused PRBC. After that she started having fever and tachycardia.  Patient was transferred to [facility] for hemodialysis need and continuous observation for her sepsis-like picture. Patient continues to have low hemoglobin and therefore GI was consulted.  Patient has refused bowel prep x2 so far. GI has signed off. 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  Also found to have foot osteomyelitis needing surgical resection. 4/14, patient underwent left great toe amputation and revision to the healthy margins.  Blood cultures negative. 4/15, temperature overnight.  Extremely tender right foot with swelling. CT scan with diffuse cellulitis, no localized collection and no evidence of osteomyelitis.  Continue antibiotics. 4/16, hemoglobin less than 7.  No active bleeding.  2 units transfused.  Assessment and Plan: 1.  Lower GI bleed S/P EGD, duodenal AVM treated with APC Primary reason for patient's presentation.  Patient reported hematochezia. 04/03/2020, 2 units PRBC hemoglobin responded. 04/13/2020, hemoglobin less than 7, 2 units of PRBC today with hemodialysis. Patient has refused colonoscopy prep x2. Not a candidate for capsule endoscopy given no bowel prep. Remains at risk for further GI bleed. Prior colonoscopy last February was suggestive of an AVM treated with clips.  2.  ESRD on HD Admitted to [facility] for ESRD. Continue HD appreciate nephrology input.  Getting dialysis on her schedule.  3.  History of combined chronic CHF Currently  euvolemic.  Monitor.  4.  Type 2 diabetes mellitus with renal complication.  Uncontrolled with hyperglycemia Currently on sliding scale insulin.  Well-controlled.  5.  Essential hypertension. Blood pressure stable.  Monitor.  6.  Osteomyelitis of the foot. Nonhealing left leg ulcer. PVD. Blood cultures negative. ID consulted. X-ray shows evidence of osteomyelitis of the foot. Patient underwent surgical procedure with revision of left great toe amputation 4/14.Continue IV antibiotics with HD. Patient has new development of pain and swelling of the right foot.  Recultures.  Vascular planning for repeat right lower extremity arteriogram.  7. Morbid obesity. Body mass index is 41.07 kg/m.   Diet: Renal diet DVT Prophylaxis: SCD, pharmacological prophylaxis contraindicated due to Due to GI bleed   Advance goals of care discussion: Full code  Family Communication: Patient talking to family.  Disposition:  For dialysis today.  Hemoglobin less than 7.  Needing transfusion.  Subjective: Patient seen and examined.  She was getting dialysis in the morning rounds.  Had low-grade temperature.  Patient herself denied any complaints.  Still has pain on the right foot, slightly better than before.  Physical Exam: Physical Exam  Constitutional:  Not in any distress.  Chronically sick looking.  On room air.  HENT:  Head: Normocephalic.  Cardiovascular: Normal rate and regular rhythm.  Respiratory:  Right chest wall permacath present.  Musculoskeletal:     Cervical back: Neck supple.     Comments: Left foot on immediate postop dressing, removed by surgery and reported well and dry. Right foot with swelling, sloughing of skin on planter aspect.    Vitals:   04/13/20 1100 04/13/20 1113 04/13/20 1217  04/13/20 1320  BP: 140/76 (!) 146/77  123/74  Pulse: 89 96  70  Resp:  18 18 15   Temp:  98.5 F (36.9 C)  98.8 F (37.1 C)  TempSrc:  Oral  Axillary  SpO2:  97%  99%  Weight:        Height:        Intake/Output Summary (Last 24 hours) at 04/13/2020 1523 Last data filed at 04/13/2020 1218 Gross per 24 hour  Intake 509.17 ml  Output --  Net 509.17 ml   Filed Weights   04/10/20 1140 04/11/20 0904 04/13/20 0700  Weight: 121.3 kg 121.3 kg 122.5 kg    Data Reviewed: I have personally reviewed and interpreted daily labs, tele strips, imagings as discussed above. I reviewed all nursing notes, pharmacy notes, vitals, pertinent old records I have discussed plan of care as described above with RN and patient/family.  CBC: Recent Labs  Lab 04/09/20 0744 04/10/20 0243 04/11/20 0341 04/12/20 0410 04/13/20 0209  WBC 8.5 10.0 12.6* 12.6* 9.5  NEUTROABS 6.0  --   --   --   --   HGB 7.7* 7.4* 8.1* 7.8* 6.8*  HCT 25.3* 24.0* 25.8* 25.2* 21.9*  MCV 90.0 89.9 89.9 89.4 87.3  PLT 269 290 302 377 151   Basic Metabolic Panel: Recent Labs  Lab 04/08/20 1009 04/08/20 1009 04/09/20 0744 04/10/20 0243 04/11/20 0341 04/12/20 0410 04/13/20 0209  NA 133*   < > 137 135 139 137 136  K 3.9   < > 4.0 4.4 4.0 4.8 4.9  CL 94*   < > 97* 95* 98 96* 97*  CO2 23   < > 24 25 25 24 24   GLUCOSE 138*   < > 103* 109* 109* 101* 144*  BUN 48*   < > 43* 55* 28* 39* 49*  CREATININE 7.19*   < > 6.71* 7.77* 5.29* 6.62* 7.66*  CALCIUM 8.0*   < > 8.2* 8.4* 8.3* 8.7* 8.0*  PHOS 5.7*  --   --   --   --   --   --    < > = values in this interval not displayed.    Studies: No results found.  Scheduled Meds: . sodium chloride   Intravenous Once  . allopurinol  100 mg Oral Daily  . atorvastatin  40 mg Oral q1800  . buPROPion  150 mg Oral BID  . calcitRIOL  2.25 mcg Oral Q T,Th,Sat-1800  . Chlorhexidine Gluconate Cloth  6 each Topical Daily  . Chlorhexidine Gluconate Cloth  6 each Topical Q0600  . cinacalcet  30 mg Oral Q T,Th,Sat-1800  . gabapentin  300 mg Oral QHS  . insulin aspart  0-5 Units Subcutaneous QHS  . insulin aspart  0-9 Units Subcutaneous TID WC  . insulin aspart  2  Units Subcutaneous TID WC  . latanoprost  1 drop Both Eyes QHS  . linaclotide  72 mcg Oral QAC breakfast  . loratadine  10 mg Oral Daily  . metoprolol tartrate  25 mg Oral BID  . pantoprazole  40 mg Oral Daily  . polyethylene glycol  17 g Oral BID  . senna-docusate  1 tablet Oral BID  . sucroferric oxyhydroxide  1,000 mg Oral TID WC   Continuous Infusions:  PRN Meds: acetaminophen, albuterol, mometasone-formoterol, nitroGLYCERIN, oxyCODONE  Time spent: 25 minutes

## 2020-04-13 NOTE — Progress Notes (Signed)
Physical Therapy Treatment Patient Details Name: [name] [name] MRN: [id number] DOB: 1951/05/25 Today's Date: 04/13/2020    History of Present Illness Patient is a 69 y/o female admitted 04/03/20 with lower GI bleed due to duodenal AVM and fever, SIRS-like picture. S/p L great toe amputation revision on 4/14. Increased R foot swelling 4/15, CT showed diffuse cellulitis, no evidence of osteomyelitis. 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   PT Comments    Pt slowly progressing with mobility, now post-op L great toe amputation revision. Pt received seated on toilet, required multiple attempts with maxA+2 to stand. Unable to tolerate standing requiring maxA+2 to totalA for pivot to recliner from toilet. Performed additional sit<>stands, dependent for pericare. Pt unable to maintain any WB precautions through L foot. Limited by generalized weakness, decreased activity tolerance and poor safety awareness. Continue to recommend SNF-level therapies.   Follow Up Recommendations  SNF;Supervision/Assistance - 24 hour     Equipment Recommendations  None recommended by PT    Recommendations for Other Services       Precautions / Restrictions Precautions Precautions: Fall Restrictions Weight Bearing Restrictions: Yes Other Position/Activity Restrictions: s/p L great toe amputation revision (4/14) - assume NWB vs. WB thru heel; no WB orders in chart    Mobility  Bed Mobility               General bed mobility comments: Received sitting on toilet (having walked to bathroom with NT)  Transfers Overall transfer level: Needs assistance Equipment used: 2 person hand held assist Transfers: Sit to/from [facility] Sit to Stand: Max assist;+2 physical assistance;Mod assist Stand pivot transfers: Max assist;+2 physical assistance       General transfer comment: MaxA+2 to stand  from low toilet height with bilateral HHA to pull into standing, pt with uncontrolled sent due to fatigue, performed 2x more trial with pivot to recliner from toilet. Once in recliner, pt able to perform 2x more sit<>stands with modA+2 to RW  Ambulation/Gait             General Gait Details: Deferred. Do not expect pt will be able to maintain WB precautions if any   Stairs             Wheelchair Mobility    Modified Rankin (Stroke Patients Only)       Balance Overall balance assessment: Needs assistance Sitting-balance support: Feet supported;No upper extremity supported Sitting balance-Leahy Scale: Fair       Standing balance-Leahy Scale: Poor Standing balance comment: Heavy reliance on BUE support and external assist; dependent for pericare                            Cognition Arousal/Alertness: Awake/alert Behavior During Therapy: WFL for tasks assessed/performed Overall Cognitive Status: No family/caregiver present to determine baseline cognitive functioning Area of Impairment: Attention;Following commands;Safety/judgement;Awareness;Problem solving                   Current Attention Level: Selective   Following Commands: Follows one step commands consistently;Follows one step commands with increased time Safety/Judgement: Decreased awareness of safety;Decreased awareness of deficits Awareness: Emergent Problem Solving: Requires verbal cues        Exercises      General Comments        Pertinent Vitals/Pain Pain Assessment: Faces Faces Pain Scale: Hurts a little bit Pain  Location: LLE Pain Descriptors / Indicators: Discomfort Pain Intervention(s): Monitored during session;Limited activity within patient's tolerance    Home Living                      Prior Function            PT Goals (current goals can now be found in the care plan section) Progress towards PT goals: Progressing toward goals     Frequency    Min 2X/week      PT Plan Current plan remains appropriate    Co-evaluation              AM-PAC PT "6 Clicks" Mobility   Outcome Measure  Help needed turning from your back to your side while in a flat bed without using bedrails?: A Little Help needed moving from lying on your back to sitting on the side of a flat bed without using bedrails?: A Lot Help needed moving to and from a bed to a chair (including a wheelchair)?: A Lot Help needed standing up from a chair using your arms (e.g., wheelchair or bedside chair)?: A Lot Help needed to walk in hospital room?: Total Help needed climbing 3-5 steps with a railing? : Total 6 Click Score: 11    End of Session   Activity Tolerance: Patient limited by fatigue Patient left: in chair;with call bell/phone within reach Nurse Communication: Mobility status PT Visit Diagnosis: Pain;Muscle weakness (generalized) (M62.81);Unsteadiness on feet (R26.81);Difficulty in walking, not elsewhere classified (R26.2) Pain - Right/Left: Left Pain - part of body: Ankle and joints of foot;Leg     Time: 1610-1630 PT Time Calculation (min) (ACUTE ONLY): 20 min  Charges:  $Therapeutic Activity: 8-22 mins                    Mabeline Caras, PT, DPT Acute Rehabilitation Services  Pager [phone] Office [place] 04/13/2020, 5:46 PM

## 2020-04-14 LAB — TYPE AND SCREEN
ABO/RH(D): O POS
Antibody Screen: NEGATIVE
Unit division: 0
Unit division: 0

## 2020-04-14 LAB — BPAM RBC
Blood Product Expiration Date: 202105112359
Blood Product Expiration Date: 202105112359
ISSUE DATE / TIME: 202104160849
ISSUE DATE / TIME: 202104160849
Unit Type and Rh: 5100
Unit Type and Rh: 5100

## 2020-04-14 LAB — CBC
HCT: 27.3 % — ABNORMAL LOW (ref 36.0–46.0)
Hemoglobin: 8.8 g/dL — ABNORMAL LOW (ref 12.0–15.0)
MCH: 27.7 pg (ref 26.0–34.0)
MCHC: 32.2 g/dL (ref 30.0–36.0)
MCV: 85.8 fL (ref 80.0–100.0)
Platelets: 403 10*3/uL — ABNORMAL HIGH (ref 150–400)
RBC: 3.18 MIL/uL — ABNORMAL LOW (ref 3.87–5.11)
RDW: 19.3 % — ABNORMAL HIGH (ref 11.5–15.5)
WBC: 16.2 10*3/uL — ABNORMAL HIGH (ref 4.0–10.5)
nRBC: 0.2 % (ref 0.0–0.2)

## 2020-04-14 LAB — BASIC METABOLIC PANEL
Anion gap: 14 (ref 5–15)
BUN: 34 mg/dL — ABNORMAL HIGH (ref 8–23)
CO2: 25 mmol/L (ref 22–32)
Calcium: 8.4 mg/dL — ABNORMAL LOW (ref 8.9–10.3)
Chloride: 97 mmol/L — ABNORMAL LOW (ref 98–111)
Creatinine, Ser: 5.7 mg/dL — ABNORMAL HIGH (ref 0.44–1.00)
GFR calc Af Amer: 8 mL/min — ABNORMAL LOW (ref >60)
GFR calc non Af Amer: 7 mL/min — ABNORMAL LOW (ref >60)
Glucose, Bld: 131 mg/dL — ABNORMAL HIGH (ref 70–99)
Potassium: 4 mmol/L (ref 3.5–5.1)
Sodium: 136 mmol/L (ref 135–145)

## 2020-04-14 LAB — CULTURE, BLOOD (ROUTINE X 2)
Culture: NO GROWTH
Culture: NO GROWTH
Special Requests: ADEQUATE
Special Requests: ADEQUATE

## 2020-04-14 LAB — GLUCOSE, CAPILLARY
Glucose-Capillary: 134 mg/dL — ABNORMAL HIGH (ref 70–99)
Glucose-Capillary: 116 mg/dL — ABNORMAL HIGH (ref 70–99)
Glucose-Capillary: 130 mg/dL — ABNORMAL HIGH (ref 70–99)
Glucose-Capillary: 104 mg/dL — ABNORMAL HIGH (ref 70–99)

## 2020-04-14 NOTE — Progress Notes (Addendum)
[facility] Progress Note   Subjective:  Seen in room. She is quiet today, says feet hurting. Denies CP/dyspnea. On schedule for dialysis later today.   Objective Vitals:   04/13/20 1217 04/13/20 1320 04/13/20 2116 04/14/20 0454  BP:  123/74 110/65 (!) 111/92  Pulse:  70 61 64  Resp: 18 15  18   Temp:  98.8 F (37.1 C) 100.2 F (37.9 C) 99.5 F (37.5 C)  TempSrc:  Axillary Oral Oral  SpO2:  99% 96% 100%  Weight:      Height:       Physical Exam General: Overweight woman, NAD Heart: RRR; no murmur Lungs: CTA anteriorly Extremities: L foot bandaged, scattered wounds to R foot. 2+ BLE edema Dialysis Access: [facility]  Additional Objective Labs: Basic Metabolic Panel: Recent Labs  Lab 04/08/20 1009 04/09/20 0744 04/12/20 0410 04/13/20 0209 04/14/20 0549  NA 133*   < > 137 136 136  K 3.9   < > 4.8 4.9 4.0  CL 94*   < > 96* 97* 97*  CO2 23   < > 24 24 25   GLUCOSE 138*   < > 101* 144* 131*  BUN 48*   < > 39* 49* 34*  CREATININE 7.19*   < > 6.62* 7.66* 5.70*  CALCIUM 8.0*   < > 8.7* 8.0* 8.4*  PHOS 5.7*  --   --   --   --    < > = values in this interval not displayed.   Liver Function Tests: Recent Labs  Lab 04/08/20 1009 04/09/20 0744  AST  --  25  ALT  --  18  ALKPHOS  --  62  BILITOT  --  0.3  PROT  --  6.2*  ALBUMIN 2.3* 2.4*   CBC: Recent Labs  Lab 04/09/20 0744 04/09/20 0744 04/10/20 0243 04/10/20 0243 04/11/20 0341 04/11/20 0341 04/12/20 0410 04/13/20 0209 04/14/20 0549  WBC 8.5   < > 10.0   < > 12.6*   < > 12.6* 9.5 16.2*  NEUTROABS 6.0  --   --   --   --   --   --   --   --   HGB 7.7*   < > 7.4*   < > 8.1*   < > 7.8* 6.8* 8.8*  HCT 25.3*   < > 24.0*   < > 25.8*   < > 25.2* 21.9* 27.3*  MCV 90.0   < > 89.9  --  89.9  --  89.4 87.3 85.8  PLT 269   < > 290   < > 302   < > 377 352 403*   < > = values in this interval not displayed.   Blood Culture    Component Value Date/Time   SDES BLOOD RIGHT HAND 04/12/2020 1651    SPECREQUEST AEROBIC BOTTLE ONLY Blood Culture adequate volume 04/12/2020 1651   CULT  04/12/2020 1651    NO GROWTH 2 DAYS Performed at [facility], [place] [address] [place], [place] [address]    REPTSTATUS PENDING 04/12/2020 1651   Medications:  . sodium chloride   Intravenous Once  . allopurinol  100 mg Oral Daily  . atorvastatin  40 mg Oral q1800  . buPROPion  150 mg Oral BID  . calcitRIOL  2.25 mcg Oral Q T,Th,Sat-1800  . Chlorhexidine Gluconate Cloth  6 each Topical Daily  . Chlorhexidine Gluconate Cloth  6 each Topical Q0600  . cinacalcet  30 mg Oral Q  T,Th,Sat-1800  . gabapentin  300 mg Oral QHS  . insulin aspart  0-5 Units Subcutaneous QHS  . insulin aspart  0-9 Units Subcutaneous TID WC  . insulin aspart  2 Units Subcutaneous TID WC  . latanoprost  1 drop Both Eyes QHS  . linaclotide  72 mcg Oral QAC breakfast  . loratadine  10 mg Oral Daily  . metoprolol tartrate  25 mg Oral BID  . pantoprazole  40 mg Oral Daily  . polyethylene glycol  17 g Oral BID  . senna-docusate  1 tablet Oral BID  . sucroferric oxyhydroxide  1,000 mg Oral TID WC    Dialysis Orders: [facility] TTS  4h26min 400/800 118kg 2K/2.5CaRIJTDC No heparin - Venofer 100mg  IVq HD until 4/17 - Mircera 274mcg IV q 2 weeks(last 4/1) - Sensipar 30mg  PO TIW - Calcitriol 2.44mcg PO TIW  Assessment/Plan: 1. Recurrent GI bleeding: Hx AVMs. 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  She has refused colonoscopy. 2. ABLA + anemia of ESRD: Hgb 6.8 on admit -> s/p 2U PRBCs, then down again with another 2U given 4/16. Aranesp 200 mcg IV given 4/16. 3. L foot osteomyelitis: ID following - s/p 1st toe transmetarsal amputation 4/14. Per [facility] note, 50% chance to heal. Blood Cx negative. Looks like done with antibiotics? 4. ESRD: Continue HD on TTS schedule. Using AVF when able (new) - still has TDC.  5. Hypertension/volume - BP low/stable.Has BLE edema, well above OP EDW. UF as  tolerated. 6. Metabolic bone disease -Continue binders/Sensipar/Calcitriol for now 7. Nutrition -Renal diet/prot supp 8. R foot ulcers: [facility] planning for RLE angiogram in future. Needs wound care. 9.  Disposition- awaiting to be stable for discharge to SNF  Veneta Penton, PA-C 04/14/2020, 10:55 AM  [place]  I have seen and examined this patient and agree with plan and assessment in the above note with renal recommendations/intervention highlighted.  Unclear about disposition and discharge time frame.  Plan to return to [facility] when stable.  Governor Rooks Sanya Kobrin,MD 04/14/2020 12:57 PM

## 2020-04-14 NOTE — Progress Notes (Signed)
[facility] Progress Note  Patient: [name] [name]    ZOX:[id number]  DOA: 04/03/2020     Date of Service: the patient was seen and examined on 04/14/2020  Chief Complaint  Patient presents with  . Abnormal Lab   Brief hospital course: Past medical history of ESRD on HD, CAD S/P CABG, combined CHF, type II DM, CVA.  Recurrent anemia.  Patient presented with complaints of abnormal hemoglobin from hemodialysis.  She was transfused PRBC. After that she started having fever and tachycardia.  Patient was transferred to [facility] for hemodialysis need and continuous observation for her sepsis-like picture.  Patient continues to have low hemoglobin and therefore GI was consulted.  Patient has refused bowel prep x2 so far. GI has signed off. 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  Also found to have foot osteomyelitis needing surgical resection. 4/14, patient underwent left great toe amputation and revision. Blood cultures negative. 4/15, temperature overnight.  Extremely tender right foot with swelling. CT scan with diffuse cellulitis, no localized collection and no evidence of osteomyelitis.  Continue antibiotics. 4/16, hemoglobin less than 7.  No active bleeding.  2 units transfused with appropriate response.  Assessment and Plan: 1.  Acute upper GI bleeding: S/P EGD, duodenal AVM treated with APC Primary reason for patient's presentation.  Patient reported hematochezia. 04/03/2020, 2 units PRBC hemoglobin responded. 04/13/2020, hemoglobin less than 7, 2 units of PRBC with appropriate response. Patient has refused colonoscopy prep x2. Not a candidate for capsule endoscopy given no bowel prep. Remains at risk for further GI bleed. Prior colonoscopy last February was suggestive of an AVM treated with clips. On PPI daily.  2.  ESRD on HD Admitted to [facility] for ESRD. Continue HD appreciate nephrology input.  Getting dialysis on her schedule.  3.  History of  combined chronic CHF Currently euvolemic.  Monitor.  4.  Type 2 diabetes mellitus with renal complication.  Uncontrolled with hyperglycemia Currently on sliding scale insulin.  Well-controlled.  5.  Essential hypertension. Blood pressure stable.  Monitor.  6.  Osteomyelitis left foot status post left great toe amputation and revision.  Dry gangrene right foot with surrounding cellulitis: Bilateral peripheral vascular disease. Left foot status post surgical resection and open wound followed by surgery, looks with poor base.  Hoping for recovery. Right foot with dry gangrene's of the toes, swelling and inflammation, no evidence of osteomyelitis.  Vascular surgery planning for right lower extremity angiogram and appropriate level of surgical treatment.  May need to amputation. We will continue cefepime until finalized surgical plans.  Cultures are negative so far.  7. Morbid obesity. Body mass index is 41.07 kg/m.   Diet: Renal diet DVT Prophylaxis: SCD, pharmacological prophylaxis contraindicated due to Due to GI bleed   Advance goals of care discussion: Full code  Family Communication: Patient talking to family.  Status is: Inpatient  Remains inpatient appropriate because:Ongoing active pain requiring inpatient pain management   Dispo: The patient is from: Home              Anticipated d/c is to: SNF              Anticipated d/c date is: > 3 days              Patient currently is not medically stable to d/c.   Subjective: Patient was seen and examined.  Today she was sitting in chair.  She feels okay as per her.  She is optimistic  that her feet will heal.   Physical Exam: Physical Exam  Constitutional:  Not in any distress.  Chronically sick looking.  On room air.  HENT:  Head: Normocephalic.  Cardiovascular: Normal rate and regular rhythm.  Respiratory:  Right chest wall permacath present.  Musculoskeletal:     Cervical back: Neck supple.     Comments: Left foot  with poor peripheral circulation, dressing changed by surgery, picture available in the chart.  Open wound with slough. Right foot with gangrene of the toes, tenderness and swelling circumferential.    Vitals:   04/13/20 1217 04/13/20 1320 04/13/20 2116 04/14/20 0454  BP:  123/74 110/65 (!) 111/92  Pulse:  70 61 64  Resp: 18 15  18   Temp:  98.8 F (37.1 C) 100.2 F (37.9 C) 99.5 F (37.5 C)  TempSrc:  Axillary Oral Oral  SpO2:  99% 96% 100%  Weight:      Height:        Intake/Output Summary (Last 24 hours) at 04/14/2020 1137 Last data filed at 04/13/2020 1800 Gross per 24 hour  Intake 360 ml  Output --  Net 360 ml   Filed Weights   04/10/20 1140 04/11/20 0904 04/13/20 0700  Weight: 121.3 kg 121.3 kg 122.5 kg    Data Reviewed: I have personally reviewed and interpreted daily labs, tele strips, imagings as discussed above. I reviewed all nursing notes, pharmacy notes, vitals, pertinent old records I have discussed plan of care as described above with RN and patient/family.  CBC: Recent Labs  Lab 04/09/20 0744 04/09/20 0744 04/10/20 0243 04/11/20 0341 04/12/20 0410 04/13/20 0209 04/14/20 0549  WBC 8.5   < > 10.0 12.6* 12.6* 9.5 16.2*  NEUTROABS 6.0  --   --   --   --   --   --   HGB 7.7*   < > 7.4* 8.1* 7.8* 6.8* 8.8*  HCT 25.3*   < > 24.0* 25.8* 25.2* 21.9* 27.3*  MCV 90.0   < > 89.9 89.9 89.4 87.3 85.8  PLT 269   < > 290 302 377 352 403*   < > = values in this interval not displayed.   Basic Metabolic Panel: Recent Labs  Lab 04/08/20 1009 04/09/20 0744 04/10/20 0243 04/11/20 0341 04/12/20 0410 04/13/20 0209 04/14/20 0549  NA 133*   < > 135 139 137 136 136  K 3.9   < > 4.4 4.0 4.8 4.9 4.0  CL 94*   < > 95* 98 96* 97* 97*  CO2 23   < > 25 25 24 24 25   GLUCOSE 138*   < > 109* 109* 101* 144* 131*  BUN 48*   < > 55* 28* 39* 49* 34*  CREATININE 7.19*   < > 7.77* 5.29* 6.62* 7.66* 5.70*  CALCIUM 8.0*   < > 8.4* 8.3* 8.7* 8.0* 8.4*  PHOS 5.7*  --   --   --    --   --   --    < > = values in this interval not displayed.    Studies: No results found.  Scheduled Meds: . sodium chloride   Intravenous Once  . allopurinol  100 mg Oral Daily  . atorvastatin  40 mg Oral q1800  . buPROPion  150 mg Oral BID  . calcitRIOL  2.25 mcg Oral Q T,Th,Sat-1800  . Chlorhexidine Gluconate Cloth  6 each Topical Daily  . Chlorhexidine Gluconate Cloth  6 each Topical Q0600  . cinacalcet  30 mg  Oral Q T,Th,Sat-1800  . gabapentin  300 mg Oral QHS  . insulin aspart  0-5 Units Subcutaneous QHS  . insulin aspart  0-9 Units Subcutaneous TID WC  . insulin aspart  2 Units Subcutaneous TID WC  . latanoprost  1 drop Both Eyes QHS  . linaclotide  72 mcg Oral QAC breakfast  . loratadine  10 mg Oral Daily  . metoprolol tartrate  25 mg Oral BID  . pantoprazole  40 mg Oral Daily  . polyethylene glycol  17 g Oral BID  . senna-docusate  1 tablet Oral BID  . sucroferric oxyhydroxide  1,000 mg Oral TID WC   Continuous Infusions:  PRN Meds: acetaminophen, albuterol, mometasone-formoterol, nitroGLYCERIN, oxyCODONE  Time spent: 25 minutes

## 2020-04-14 NOTE — Progress Notes (Addendum)
[facility]  Subjective  - Doing well over all no new complaints.   Objective (!) 111/92 64 99.5 F (37.5 C) (Oral) 18 100%  Intake/Output Summary (Last 24 hours) at 04/14/2020 1002 Last data filed at 04/13/2020 1800 Gross per 24 hour  Intake 401.67 ml  Output --  Net 401.67 ml    Left foot warm to touch   No active bleeding with some malodor.  Lungs non labored breathing  Assessment/Planning: POD # 3 69 y.o.femaleis s/p L 1st toe transmet  The Left foot amputation site has a 50% chance of healing.  We will continue to observe for now.    Future plans for right LE angiogram.  Dry gangrene without change  Roxy Horseman 04/14/2020 10:02 AM --  Laboratory Lab Results: Recent Labs    04/13/20 0209 04/14/20 0549  WBC 9.5 16.2*  HGB 6.8* 8.8*  HCT 21.9* 27.3*  PLT 352 403*   BMET Recent Labs    04/13/20 0209 04/14/20 0549  NA 136 136  K 4.9 4.0  CL 97* 97*  CO2 24 25  GLUCOSE 144* 131*  BUN 49* 34*  CREATININE 7.66* 5.70*  CALCIUM 8.0* 8.4*    COAG Lab Results  Component Value Date   INR 1.3 (H) 04/09/2020   INR 1.2 04/03/2020   INR 1.2 08/25/2019   No results found for: PTT  I have seen and evaluated the patient. I agree with the PA note as documented above. Dressing changed at bedside this am.  Tissue marginal.  Will continue to monitor.  Marty Heck, MD [facility] Office: [phone]

## 2020-04-15 DIAGNOSIS — I480 Paroxysmal atrial fibrillation: Secondary | ICD-10-CM

## 2020-04-15 DIAGNOSIS — H409 Unspecified glaucoma: Secondary | ICD-10-CM

## 2020-04-15 LAB — GLUCOSE, CAPILLARY
Glucose-Capillary: 102 mg/dL — ABNORMAL HIGH (ref 70–99)
Glucose-Capillary: 123 mg/dL — ABNORMAL HIGH (ref 70–99)
Glucose-Capillary: 127 mg/dL — ABNORMAL HIGH (ref 70–99)
Glucose-Capillary: 139 mg/dL — ABNORMAL HIGH (ref 70–99)

## 2020-04-15 MED ORDER — SODIUM CHLORIDE 0.9 % IR SOLN: 1000.0000 mL | Freq: Once | Status: DC

## 2020-04-15 MED ORDER — CHLORHEXIDINE GLUCONATE CLOTH 2 % EX PADS
6.0000 | MEDICATED_PAD | Freq: Every day | CUTANEOUS | Status: DC
  Administered 2020-04-17 – 2020-04-19 (×3): 6 via TOPICAL

## 2020-04-15 MED ORDER — DAKINS (FULL STRENGTH) SOLUTION 0.5%
Freq: Once | CUTANEOUS | Status: AC
  Filled 2020-04-15: qty 1000

## 2020-04-15 NOTE — Progress Notes (Addendum)
[facility] Progress Note   Subjective:  Seen in room. Denies CP/dyspnea - looks comfortable. Need to clarify that she got her dialysis yesterday - ?documentation missing in Epic.  Objective Vitals:   04/14/20 1455 04/14/20 2018 04/14/20 2142 04/15/20 0459  BP: (!) 118/105 (!) 86/61 120/78 (!) 114/59  Pulse: 76 71 77 73  Resp: 18 15  15   Temp: 100 F (37.8 C) 98.4 F (36.9 C)  98.6 F (37 C)  TempSrc: Oral Oral  Oral  SpO2: 97% 96%  99%  Weight:      Height:       Physical Exam General: Overweight woman, NAD Heart: RRR; no murmur Lungs: CTA anteriorly Extremities: L foot bandaged, scattered wounds to R foot. 2+ BLE edema [facility]ialysis [facility]: [facility]  Additional Objective Labs: Basic Metabolic Panel: Recent Labs  Lab 04/12/20 0410 04/13/20 0209 04/14/20 0549  NA 137 136 136  K 4.8 4.9 4.0  CL 96* 97* 97*  CO2 24 24 25   GLUCOSE 101* 144* 131*  BUN 39* 49* 34*  CREATININE 6.62* 7.66* 5.70*  CALCIUM 8.7* 8.0* 8.4*   Liver Function Tests: Recent Labs  Lab 04/09/20 0744  AST 25  ALT 18  ALKPHOS 62  BILITOT 0.3  PROT 6.2*  ALBUMIN 2.4*   CBC: Recent Labs  Lab 04/09/20 0744 04/09/20 0744 04/10/20 0243 04/10/20 0243 04/11/20 0341 04/11/20 0341 04/12/20 0410 04/13/20 0209 04/14/20 0549  WBC 8.5   < > 10.0   < > 12.6*   < > 12.6* 9.5 16.2*  NEUTROABS 6.0  --   --   --   --   --   --   --   --   HGB 7.7*   < > 7.4*   < > 8.1*   < > 7.8* 6.8* 8.8*  HCT 25.3*   < > 24.0*   < > 25.8*   < > 25.2* 21.9* 27.3*  MCV 90.0   < > 89.9  --  89.9  --  89.4 87.3 85.8  PLT 269   < > 290   < > 302   < > 377 352 403*   < > = values in this interval not displayed.   Medications:  . sodium chloride   Intravenous Once  . allopurinol  100 mg Oral Daily  . atorvastatin  40 mg Oral q1800  . buPROPion  150 mg Oral BID  . calcitRIOL  2.25 mcg Oral Q T,Th,Sat-1800  . Chlorhexidine Gluconate Cloth  6 each Topical Daily  . Chlorhexidine Gluconate Cloth  6 each  Topical Q0600  . cinacalcet  30 mg Oral Q T,Th,Sat-1800  . gabapentin  300 mg Oral QHS  . insulin aspart  0-5 Units Subcutaneous QHS  . insulin aspart  0-9 Units Subcutaneous TID WC  . insulin aspart  2 Units Subcutaneous TID WC  . latanoprost  1 drop Both Eyes QHS  . linaclotide  72 mcg Oral QAC breakfast  . loratadine  10 mg Oral Daily  . metoprolol tartrate  25 mg Oral BID  . pantoprazole  40 mg Oral Daily  . polyethylene glycol  17 g Oral BID  . senna-docusate  1 tablet Oral BID  . sucroferric oxyhydroxide  1,000 mg Oral TID WC    Dialysis Orders: [facility] TTS  4h9min 400/800 118kg 2K/2.5CaRIJTDC No heparin - Venofer 100mg  IVq HD until 4/17 - Mircera 252mcg IV q 2 weeks(last 4/1) - Sensipar 30mg  PO TIW - Calcitriol 2.88mcg PO  TIW  Assessment/Plan: 1. Recurrent GI bleeding: Hx AVMs. 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 She has refused colonoscopy. 2. ABLA + anemia of ESRD: Hgb 6.8 on admit -> s/p 2U PRBCs, then down again with another 2U given 4/16. Aranesp 200 mcg IV given 4/16. 3. L foot osteomyelitis: ID following - s/p 1st toe transmetarsal amputation 4/14. Per VVS note, 50% chance to heal. Blood Cx negative. Looks like done with antibiotics? 4. ESRD: Continue HD on TTS schedule - next 4/20. Using AVF when able (new) - still has TDC.  5. Hypertension/volume - BP low/stable.Has BLE edema, well above OP EDW. UF as tolerated. 6. Metabolic bone disease -Continue binders/Sensipar/Calcitriol for now 7. Nutrition -Renal diet/prot supp 8. R foot ulcers: VVS planning for RLE angiogram in future.Needs wound care. 9.  Disposition- awaiting to be stable for discharge to SNF  Veneta Penton, PA-C 04/15/2020, 11:27 AM  [place]  I have seen and examined this patient and agree with plan and assessment in the above note with renal recommendations/intervention highlighted.   Broadus John A Coladonato,MD 04/15/2020 12:11 PM    Addendum  (12:40pm): Reviewed records, looks like was NOT dialyzed yesterday - got HD on Friday (which was rollover from Thursday), unclear exactly what happened. She is stable at this time. Will plan to dialyze TOMORROW (Monday) - then pick up on Thurs and Sat per regular schedule. [place]

## 2020-04-15 NOTE — Plan of Care (Signed)
  Problem: Education: Goal: Knowledge of disease and its progression will improve Outcome: Progressing Goal: Individualized Educational Video(s) Outcome: Progressing   Problem: Fluid Volume: Goal: Compliance with measures to maintain balanced fluid volume will improve Outcome: Progressing

## 2020-04-15 NOTE — Progress Notes (Signed)
PROGRESS NOTE  [name] [name] QVZ:[id number] DOB: 07-23-1951 DOA: 04/03/2020 PCP: [facility]  HPI/Recap of past 52 hours: 69 year old female past medical history of end-stage renal disease on hemodialysis, CAD status post CABG, chronic systolic/diastolic heart failure and diabetes mellitus plus history of stroke who presents with complaints of low hemoglobin after dialysis and was admitted to the hospitalist service on 4/6.  During her hospital course, she started having fever and tachycardia and was transferred to [facility] for hemodialysis and continued observation for possible sepsis.  She was found to have a foot osteomyelitis and underwent left great toe amputation with revision.  Postop, continue to have fever and found to have diffuse cellulitis but no signs of further osteomyelitis.  Patient has continued to have low hemoglobin and GI was consulted.  EGD performed noted duodenal AVMs, but patient has refused bowel prep twice over so no colonoscopy cannot be done and GI has signed off.  Patient was transfused 2 more units on 4/16 after her hemoglobin dropped below 7.  Today, she is resting comfortably with no complaints.  Assessment/Plan:   Osteomyelitis ([place]) of left foot status post left great toe amputation and revision with dry gangrene of right foot with surrounding cellulitis.  Vascular surgery planning for right lower extremity angiogram and appropriate level of surgical treatment, may need amputation.  Off antibiotics at this time Active Problems:   Glaucoma: Continue drops.    Essential hypertension: Blood pressure stable.    Type 2 diabetes mellitus with chronic kidney disease on chronic dialysis, with long-term current use of insulin ([place]): Currently on sliding scale, sugars well controlled.    Obesity, Class III, BMI 40-49.9 (morbid obesity) [facility]): Patient meets criteria BMI greater than 40.    Chronic combined systolic and diastolic CHF  (congestive heart failure) ([place]): Appears to be euvolemic.  Managing volume overload, especially in the setting of blood transfusions, with hemodialysis.    ESRD on dialysis [facility]): Being followed by nephrology.  Continue as per Tuesday/Thursday/Saturday schedule.    Substance abuse ([place])   Unspecified atrial fibrillation ([place]): Currently in sinus rhythm    GI bleed recurrent causing symptomatic anemia: Principal problem.  Patient refuses to have bowel prep.  EGD noted duodenal AVM.  Transfuse for hemoglobin less than 7.  Has received 2 separate transfusions with 2 units each time.  Not a candidate for capsule endoscopy given no bowel prep.  Remains at risk for further GI bleed.  Had a colonoscopy last February suggestive of AVM treated with clips.  On PPI daily.   Code Status: Full code  Family Communication: Left message for family   Disposition Plan: Skilled nursing once decision made by vascular surgery on right lower extremity for possible amputation, currently waiting for angiogram.  Also watching hemoglobin to ensure no further transfusions needed  Consultants:  Gastroenterology  Vascular surgery  Nephrology  Infectious disease  Procedures:  EGD noting duodenal AV malformations  Hemodialysis  Multiple episodes of blood transfusions  Antimicrobials:  Oral Keflex 4/11-4/12  Preop Ancef  DVT prophylaxis: SCDs   Objective: Vitals:   04/14/20 2142 04/15/20 0459  BP: 120/78 (!) 114/59  Pulse: 77 73  Resp:  15  Temp:  98.6 F (37 C)  SpO2:  99%    Intake/Output Summary (Last 24 hours) at 04/15/2020 1515 Last data filed at 04/15/2020 0500 Gross per 24 hour  Intake 120 ml  Output 0 ml  Net 120 ml   [employer]  04/10/20 1140 04/11/20 0904 04/13/20 0700  Weight: 121.3 kg 121.3 kg 122.5 kg   Body mass index is 41.07 kg/m.  Exam:   General: Oriented x3, resting comfortably  HEENT: Cephalic and atraumatic, mucous membranes slightly dry  Neck:  Thick, narrow airway  Cardiovascular: Regular rate and rhythm, S1-S2  Respiratory: Clear to auscultation bilaterally  Abdomen: Soft, nontender, nondistended, positive bowel sounds  Musculoskeletal: No clubbing or cyanosis, 1+ pitting edema bilaterally.  Right foot with gangrenous toes.  Left foot currently dressed.  Open wound with slough noted, see surgical pictures  Skin: As above  Psychiatry: Appropriate, no evidence of psychoses   Data Reviewed: CBC: Recent Labs  Lab 04/09/20 0744 04/09/20 0744 04/10/20 0243 04/11/20 0341 04/12/20 0410 04/13/20 0209 04/14/20 0549  WBC 8.5   < > 10.0 12.6* 12.6* 9.5 16.2*  NEUTROABS 6.0  --   --   --   --   --   --   HGB 7.7*   < > 7.4* 8.1* 7.8* 6.8* 8.8*  HCT 25.3*   < > 24.0* 25.8* 25.2* 21.9* 27.3*  MCV 90.0   < > 89.9 89.9 89.4 87.3 85.8  PLT 269   < > 290 302 377 352 403*   < > = values in this interval not displayed.   Basic Metabolic Panel: Recent Labs  Lab 04/10/20 0243 04/11/20 0341 04/12/20 0410 04/13/20 0209 04/14/20 0549  NA 135 139 137 136 136  K 4.4 4.0 4.8 4.9 4.0  CL 95* 98 96* 97* 97*  CO2 25 25 24 24 25   GLUCOSE 109* 109* 101* 144* 131*  BUN 55* 28* 39* 49* 34*  CREATININE 7.77* 5.29* 6.62* 7.66* 5.70*  CALCIUM 8.4* 8.3* 8.7* 8.0* 8.4*   GFR: Estimated Creatinine Clearance: 12.8 mL/min (A) (by C-G formula based on SCr of 5.7 mg/dL (H)). Liver Function Tests: Recent Labs  Lab 04/09/20 0744  AST 25  ALT 18  ALKPHOS 62  BILITOT 0.3  PROT 6.2*  ALBUMIN 2.4*   No results for input(s): LIPASE, AMYLASE in the last 168 hours. No results for input(s): AMMONIA in the last 168 hours. Coagulation Profile: Recent Labs  Lab 04/09/20 0744  INR 1.3*   Cardiac Enzymes: No results for input(s): CKTOTAL, CKMB, CKMBINDEX, TROPONINI in the last 168 hours. BNP (last 3 results) No results for input(s): PROBNP in the last 8760 hours. HbA1C: No results for input(s): HGBA1C in the last 72 hours. CBG: Recent  Labs  Lab 04/14/20 1145 04/14/20 1725 04/14/20 2135 04/15/20 0759 04/15/20 1228  GLUCAP 116* 130* 104* 102* 123*   Lipid Profile: No results for input(s): CHOL, HDL, LDLCALC, TRIG, CHOLHDL, LDLDIRECT in the last 72 hours. Thyroid Function Tests: No results for input(s): TSH, T4TOTAL, FREET4, T3FREE, THYROIDAB in the last 72 hours. Anemia Panel: No results for input(s): VITAMINB12, FOLATE, FERRITIN, TIBC, IRON, RETICCTPCT in the last 72 hours. Urine analysis:    Component Value Date/Time   COLORURINE YELLOW 08/19/2019 0712   APPEARANCEUR HAZY (A) 08/19/2019 0712   LABSPEC 1.012 08/19/2019 0712   PHURINE 5.0 08/19/2019 0712   GLUCOSEU NEGATIVE 08/19/2019 0712   HGBUR NEGATIVE 08/19/2019 0712   BILIRUBINUR NEGATIVE 08/19/2019 0712   KETONESUR NEGATIVE 08/19/2019 0712   PROTEINUR NEGATIVE 08/19/2019 0712   UROBILINOGEN 0.2 05/12/2015 0938   NITRITE NEGATIVE 08/19/2019 0712   LEUKOCYTESUR SMALL (A) 08/19/2019 0712   Sepsis Labs: @LABRCNTIP (procalcitonin:4,lacticidven:4)  ) Recent Results (from the past 240 hour(s))  Culture, blood (routine x 2)  Status: None   Collection Time: 04/09/20  7:28 AM   Specimen: BLOOD  Result Value Ref Range Status   Specimen Description BLOOD RIGHT ANTECUBITAL  Final   Special Requests   Final    BOTTLES DRAWN AEROBIC AND ANAEROBIC Blood Culture adequate volume   Culture   Final    NO GROWTH 5 DAYS Performed at [facility], [address] [place], [place] [address]    Report Status 04/14/2020 FINAL  Final  Culture, blood (routine x 2)     Status: None   Collection Time: 04/09/20  7:34 AM   Specimen: BLOOD RIGHT HAND  Result Value Ref Range Status   Specimen Description BLOOD RIGHT HAND  Final   Special Requests   Final    BOTTLES DRAWN AEROBIC AND ANAEROBIC Blood Culture adequate volume   Culture   Final    NO GROWTH 5 DAYS Performed at [facility], [place] [address], [place], [place] [address]    Report Status  04/14/2020 FINAL  Final  Surgical pcr screen     Status: None   Collection Time: 04/10/20  6:10 PM   Specimen: Nasal Mucosa; Nasal Swab  Result Value Ref Range Status   MRSA, PCR NEGATIVE NEGATIVE Final   Staphylococcus aureus NEGATIVE NEGATIVE Final    Comment: (NOTE) The Xpert SA Assay (FDA approved for NASAL specimens in patients 78 years of age and older), is one component of a comprehensive surveillance program. It is not intended to diagnose infection nor to guide or monitor treatment. Performed at [facility], [place] [address], [place], [place] [address]   Culture, blood (routine x 2)     Status: None (Preliminary result)   Collection Time: 04/12/20  4:45 PM   Specimen: BLOOD  Result Value Ref Range Status   Specimen Description BLOOD RIGHT ANTECUBITAL  Final   Special Requests   Final    BOTTLES DRAWN AEROBIC AND ANAEROBIC Blood Culture adequate volume   Culture   Final    NO GROWTH 3 DAYS Performed at [facility], [place] [address] [place], [place] [address]    Report Status PENDING  Incomplete  Culture, blood (routine x 2)     Status: None (Preliminary result)   Collection Time: 04/12/20  4:51 PM   Specimen: BLOOD RIGHT HAND  Result Value Ref Range Status   Specimen Description BLOOD RIGHT HAND  Final   Special Requests AEROBIC BOTTLE ONLY Blood Culture adequate volume  Final   Culture   Final    NO GROWTH 3 DAYS Performed at [facility], [place] [address] [place], [place] [address]    Report Status PENDING  Incomplete      Studies: No results found.  Scheduled Meds: . sodium chloride   Intravenous Once  . allopurinol  100 mg Oral Daily  . atorvastatin  40 mg Oral q1800  . buPROPion  150 mg Oral BID  . calcitRIOL  2.25 mcg Oral Q T,Th,Sat-1800  . [START ON 04/16/2020] Chlorhexidine Gluconate Cloth  6 each Topical Q0600  . cinacalcet  30 mg Oral Q T,Th,Sat-1800  . gabapentin  300 mg Oral QHS  . insulin aspart  0-5 Units Subcutaneous QHS  .  insulin aspart  0-9 Units Subcutaneous TID WC  . insulin aspart  2 Units Subcutaneous TID WC  . latanoprost  1 drop Both Eyes QHS  . linaclotide  72 mcg Oral QAC breakfast  . loratadine  10  mg Oral Daily  . metoprolol tartrate  25 mg Oral BID  . pantoprazole  40 mg Oral Daily  . polyethylene glycol  17 g Oral BID  . senna-docusate  1 tablet Oral BID  . sodium chloride irrigation  1,000 mL Irrigation Once  . sodium hypochlorite   Irrigation Once  . sucroferric oxyhydroxide  1,000 mg Oral TID WC    Continuous Infusions:   LOS: 10 days     Annita Brod, MD [facility]   04/15/2020, 3:15 PM

## 2020-04-15 NOTE — Progress Notes (Signed)
Notified MD on pt's current BP at 86/61. Pt asymptomatic. MD stated to observe pt at this time. Will recheck BP.

## 2020-04-15 NOTE — Progress Notes (Signed)
[facility]  Subjective  -no complaints   Objective (!) 114/59 73 98.6 F (37 C) (Oral) 15 99%  Intake/Output Summary (Last 24 hours) at 04/15/2020 0959 Last data filed at 04/15/2020 0500 Gross per 24 hour  Intake 120 ml  Output 0 ml  Net 120 ml    Left first transmetatarsal amputation with marginal tissue and some green drainage  Laboratory Lab Results: Recent Labs    04/13/20 0209 04/14/20 0549  WBC 9.5 16.2*  HGB 6.8* 8.8*  HCT 21.9* 27.3*  PLT 352 403*   BMET Recent Labs    04/13/20 0209 04/14/20 0549  NA 136 136  K 4.9 4.0  CL 97* 97*  CO2 24 25  GLUCOSE 144* 131*  BUN 49* 34*  CREATININE 7.66* 5.70*  CALCIUM 8.0* 8.4*    COAG Lab Results  Component Value Date   INR 1.3 (H) 04/09/2020   INR 1.2 04/03/2020   INR 1.2 08/25/2019   No results found for: PTT  Assessment/Planning:  Status post left first transmetatarsal amputation.  Tissue continues to look marginal and may have some Pseudomonas in the wound.  Will change to Dakin's dressing.  Marty Heck 04/15/2020 9:59 AM --

## 2020-04-16 LAB — SARS CORONAVIRUS 2 (TAT 6-24 HRS): SARS Coronavirus 2: NEGATIVE

## 2020-04-16 LAB — RENAL FUNCTION PANEL
Albumin: 2 g/dL — ABNORMAL LOW (ref 3.5–5.0)
Anion gap: 16 — ABNORMAL HIGH (ref 5–15)
BUN: 52 mg/dL — ABNORMAL HIGH (ref 8–23)
CO2: 24 mmol/L (ref 22–32)
Calcium: 8.5 mg/dL — ABNORMAL LOW (ref 8.9–10.3)
Chloride: 95 mmol/L — ABNORMAL LOW (ref 98–111)
Creatinine, Ser: 8.49 mg/dL — ABNORMAL HIGH (ref 0.44–1.00)
GFR calc Af Amer: 5 mL/min — ABNORMAL LOW (ref >60)
GFR calc non Af Amer: 4 mL/min — ABNORMAL LOW (ref >60)
Glucose, Bld: 129 mg/dL — ABNORMAL HIGH (ref 70–99)
Phosphorus: 4.6 mg/dL (ref 2.5–4.6)
Potassium: 5 mmol/L (ref 3.5–5.1)
Sodium: 135 mmol/L (ref 135–145)

## 2020-04-16 LAB — CBC
HCT: 25.3 % — ABNORMAL LOW (ref 36.0–46.0)
Hemoglobin: 8 g/dL — ABNORMAL LOW (ref 12.0–15.0)
MCH: 27.1 pg (ref 26.0–34.0)
MCHC: 31.6 g/dL (ref 30.0–36.0)
MCV: 85.8 fL (ref 80.0–100.0)
Platelets: 437 10*3/uL — ABNORMAL HIGH (ref 150–400)
RBC: 2.95 MIL/uL — ABNORMAL LOW (ref 3.87–5.11)
RDW: 18.9 % — ABNORMAL HIGH (ref 11.5–15.5)
WBC: 10 10*3/uL (ref 4.0–10.5)
nRBC: 1 % — ABNORMAL HIGH (ref 0.0–0.2)

## 2020-04-16 LAB — GLUCOSE, CAPILLARY
Glucose-Capillary: 83 mg/dL (ref 70–99)
Glucose-Capillary: 202 mg/dL — ABNORMAL HIGH (ref 70–99)
Glucose-Capillary: 161 mg/dL — ABNORMAL HIGH (ref 70–99)

## 2020-04-16 MED ORDER — HEPARIN SODIUM (PORCINE) 1000 UNIT/ML IJ SOLN
INTRAMUSCULAR | Status: AC
  Administered 2020-04-16: 1000 [IU]
  Filled 2020-04-16: qty 4

## 2020-04-16 NOTE — Progress Notes (Signed)
[facility] Progress Note   Subjective: Seen on HD. No C/Os.    Objective Vitals:   04/16/20 0709 04/16/20 0745 04/16/20 0800 04/16/20 0830  BP: (!) 130/53 (!) 123/48 119/64 (!) 118/49  Pulse: (!) 56 (!) 59 65 79  Resp:      Temp:      TempSrc:      SpO2:      Weight:      Height:       Physical Exam General:Pleasant obese older female in NAD Heart:S1,S2 No M/R/G Lungs:CTAB Abdomen:obese, NT Extremities:1+BLE edema. Ace wrap both feet Dialysis Access:L AVF + bruit RIJ TDC drsg CDI blood lines connected   Additional Objective Labs: Basic Metabolic Panel: Recent Labs  Lab 04/13/20 0209 04/14/20 0549 04/16/20 0236  NA 136 136 135  K 4.9 4.0 5.0  CL 97* 97* 95*  CO2 24 25 24   GLUCOSE 144* 131* 129*  BUN 49* 34* 52*  CREATININE 7.66* 5.70* 8.49*  CALCIUM 8.0* 8.4* 8.5*  PHOS  --   --  4.6   Liver Function Tests: Recent Labs  Lab 04/16/20 0236  ALBUMIN 2.0*   No results for input(s): LIPASE, AMYLASE in the last 168 hours. CBC: Recent Labs  Lab 04/11/20 0341 04/11/20 0341 04/12/20 0410 04/12/20 0410 04/13/20 0209 04/14/20 0549 04/16/20 0236  WBC 12.6*   < > 12.6*   < > 9.5 16.2* 10.0  HGB 8.1*   < > 7.8*   < > 6.8* 8.8* 8.0*  HCT 25.8*   < > 25.2*   < > 21.9* 27.3* 25.3*  MCV 89.9  --  89.4  --  87.3 85.8 85.8  PLT 302   < > 377   < > 352 403* 437*   < > = values in this interval not displayed.   Blood Culture    Component Value Date/Time   SDES BLOOD RIGHT HAND 04/12/2020 1651   SPECREQUEST AEROBIC BOTTLE ONLY Blood Culture adequate volume 04/12/2020 1651   CULT  04/12/2020 1651    NO GROWTH 3 DAYS Performed at [facility], [place] [address] [place], [place] [address]    REPTSTATUS PENDING 04/12/2020 1651    Cardiac Enzymes: No results for input(s): CKTOTAL, CKMB, CKMBINDEX, TROPONINI in the last 168 hours. CBG: Recent Labs  Lab 04/14/20 2135 04/15/20 0759 04/15/20 1228 04/15/20 1715 04/15/20 2104  GLUCAP  104* 102* 123* 127* 139*   Iron Studies: No results for input(s): IRON, TIBC, TRANSFERRIN, FERRITIN in the last 72 hours. @lablastinr3 @ Studies/Results: No results found. Medications:  . sodium chloride   Intravenous Once  . allopurinol  100 mg Oral Daily  . atorvastatin  40 mg Oral q1800  . buPROPion  150 mg Oral BID  . calcitRIOL  2.25 mcg Oral Q T,Th,Sat-1800  . Chlorhexidine Gluconate Cloth  6 each Topical Q0600  . cinacalcet  30 mg Oral Q T,Th,Sat-1800  . gabapentin  300 mg Oral QHS  . insulin aspart  0-5 Units Subcutaneous QHS  . insulin aspart  0-9 Units Subcutaneous TID WC  . insulin aspart  2 Units Subcutaneous TID WC  . latanoprost  1 drop Both Eyes QHS  . linaclotide  72 mcg Oral QAC breakfast  . loratadine  10 mg Oral Daily  . metoprolol tartrate  25 mg Oral BID  . pantoprazole  40 mg Oral Daily  . polyethylene glycol  17 g Oral BID  . senna-docusate  1 tablet Oral BID  . sodium chloride  irrigation  1,000 mL Irrigation Once  . sucroferric oxyhydroxide  1,000 mg Oral TID WC     Dialysis Orders: [facility] T[facility]  4h67min 400/800 118kg 2K/2.5CaRIJTDC No heparin - Venofer 100mg  IVq HD until 4/17 - Mircera 246mcg IV q 2 weeks(last 4/1) - Sensipar 30mg  PO TIW - Calcitriol 2.48mcg PO TIW  Assessment/Plan: 1. Recurrent GI bleeding:Hx AVMs. 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 2. ABLA + anemia of ESRD: Hgb 6.8 on admit -> s/p 2U PRBCs, then down again with another 2U given 4/16.Aranesp 200 mcg IVgiven 4/16. HGB 8.0 today.  3. L foot osteomyelitis: ID following- s/p1st toe transmetarsal amputation4/14. Per VVS note, 50% chance to heal. Blood Cx negative. Off ABX.  4. ESRD: HD today off schedule. Not sure why she is off schedule but no HD 04/17. K+ 5.0. Unfortunately using TDC with HD-should have used AVF.  5. Hypertension/volume - BP low/stable.Has BLE edema, well above OP EDW.UF as tolerated. 6. Metabolic  bone disease -Continue binders/Sensipar/Calcitriol for now 7. Nutrition -Renal diet/prot supp 8. R foot ulcers: VVS planning for RLE angiogram in future.Needs wound care.  Disposition- awaiting to be stable for discharge to SNF   H.  NP-C 04/16/2020, 8:56 AM  [employer] [phone]

## 2020-04-16 NOTE — TOC Progression Note (Signed)
Transition of Care [facility] [address] [address]) - Progression Note    Patient Details  Name: [name] [name] MRN: [id number] Date of Birth: 02-13-1951  Transition of Care [facility] CM/SW [place], [place] Phone Number:  04/16/2020, 2:23 PM  Clinical Narrative:    CSW has left voicemail for admissions dept at [facility], pt will need new auth for discharge to SNF.  Requested new COVID swab order for pt from Dr. Sloan Leiter.    Expected Discharge Plan: [place] Barriers to Discharge: Continued Medical Work up  Expected Discharge Plan and Services Expected Discharge Plan: [place] In-house Referral: Clinical Social Work Discharge Planning Services: CM Consult Post Acute Care Choice: Resumption of Svcs/PTA Provider, [place] arrangements for the past 2 months: Single Family Home Expected Discharge Date: 04/04/20                Readmission Risk Interventions Readmission Risk Prevention Plan 04/05/2020 12/12/2019 10/08/2019  Transportation Screening Complete Complete -  Medication Review Press photographer) Complete Complete -  PCP or Specialist appointment within 3-5 days of discharge Not Complete Complete -  PCP/Specialist Appt Not Complete comments facility resident - -  [place] or Home Care Consult Complete - -  SW Recovery Care/Counseling Consult Complete - -  Palliative Care Screening Not Applicable - Not Applicable  Skilled Nursing Facility Complete - -  Some recent data might be hidden

## 2020-04-16 NOTE — Progress Notes (Signed)
Pt returned to 6N07 via bed from dialysis. Received report from [place], [place]. See assessment. Will continue to monitor.

## 2020-04-16 NOTE — Progress Notes (Signed)
[facility] Progress Note  Patient: [name] [name]    NAT:[id number]  DOA: 04/03/2020     Date of Service: the patient was seen and examined on 04/16/2020  Chief Complaint  Patient presents with  . Abnormal Lab   Brief hospital course: Past medical history of ESRD on HD, CAD S/P CABG, combined CHF, type II DM, CVA.  Recurrent anemia.  Patient presented with complaints of abnormal hemoglobin from hemodialysis.  She was transfused PRBC. After that she started having fever and tachycardia.  Patient was transferred to [facility] for hemodialysis need and continuous observation for her sepsis-like picture.  Patient continues to have low hemoglobin and therefore GI was consulted.  Patient has refused bowel prep x2 so far. GI has signed off. 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  Also found to have foot osteomyelitis needing surgical resection. 4/14, patient underwent left great toe amputation and revision. Blood cultures negative. 4/15, temperature overnight.  Extremely tender right foot with swelling. CT scan with diffuse cellulitis, no localized collection and no evidence of osteomyelitis.  Continue antibiotics. 4/16, hemoglobin less than 7.  No active bleeding.  2 units transfused with appropriate response.  Assessment and Plan: 1.  Acute upper GI bleeding: S/P EGD, duodenal AVM treated with APC Primary reason for patient's presentation.  Patient reported hematochezia. 04/03/2020, 2 units PRBC hemoglobin responded. 04/13/2020, hemoglobin less than 7, 2 units of PRBC with appropriate response. Patient has refused colonoscopy prep x2. Not a candidate for capsule endoscopy given no bowel prep. Remains at risk for further GI bleed. Prior colonoscopy last February was suggestive of an AVM treated with clips. On PPI daily.  2.  ESRD on HD Admitted to [facility] for ESRD. Continue HD appreciate nephrology input.  Getting dialysis on her schedule.  3.  History of  combined chronic CHF Currently euvolemic.  Monitor.  4.  Type 2 diabetes mellitus with renal complication.  Uncontrolled with hyperglycemia Currently on sliding scale insulin.  Well-controlled.  5.  Essential hypertension. Blood pressure stable.  Monitor.  6.  Osteomyelitis left foot status post left great toe amputation and revision.  Dry gangrene right foot with surrounding cellulitis: Bilateral peripheral vascular disease. Left foot status post surgical resection and open wound followed by surgery, looks with poor base.  Hoping for recovery.  On local wound care now.  Right foot with dry gangrene's of the toes, swelling and inflammation, no evidence of osteomyelitis.  Vascular surgery planning for right lower extremity angiogram and appropriate level of surgical treatment.  May need to amputation.  See finished 1 week of antibiotic therapy for soft tissue infection.  7. Morbid obesity. Body mass index is 39.7 kg/m.   Diet: Renal diet DVT Prophylaxis: SCD, pharmacological prophylaxis contraindicated due to Due to GI bleed   Advance goals of care discussion: Full code  Family Communication: Patient talking to family.  Status is: Inpatient  Remains inpatient appropriate because:Ongoing active pain requiring inpatient pain management   Dispo: The patient is from: Home              Anticipated d/c is to: SNF              Anticipated d/c date is: > 3 days              Patient currently is not medically stable to d/c.   Discussed with vascular surgery.  Pending surgical decision whether angiogram will be plan inpatient immediately or outpatient.  Subjective: Patient was seen and examined.  She was receiving hemodialysis.  Denied any complaints.  Both foot heard on examination.  Physical Exam: Physical Exam  Constitutional:  Not in any distress.  Chronically sick looking.  On room air.  HENT:  Head: Normocephalic.  Cardiovascular: Normal rate and regular rhythm.    Respiratory:  Right chest wall permacath present.  Musculoskeletal:     Cervical back: Neck supple.     Comments: Left foot with poor peripheral circulation, dressing changed by surgery, picture available in the chart.  Open wound with slough. Right foot with gangrene of the toes, tenderness and swelling circumferential.  Tender to touch.  Vitals:   04/16/20 1000 04/16/20 1030 04/16/20 1040 04/16/20 1103  BP: (!) 115/55 130/78 96/68 105/66  Pulse: (!) 59 70 67 67  Resp:    18  Temp:    98.9 F (37.2 C)  TempSrc:    Oral  SpO2:    94%  Weight:    118.4 kg  Height:        Intake/Output Summary (Last 24 hours) at 04/16/2020 1343 Last data filed at 04/16/2020 1103 Gross per 24 hour  Intake --  Output 3712 ml  Net -3712 ml   Filed Weights   04/13/20 0700 04/16/20 0701 04/16/20 1103  Weight: 122.5 kg 122.3 kg 118.4 kg    Data Reviewed: I have personally reviewed and interpreted daily labs, tele strips, imagings as discussed above. I reviewed all nursing notes, pharmacy notes, vitals, pertinent old records I have discussed plan of care as described above with RN and patient/family.  CBC: Recent Labs  Lab 04/11/20 0341 04/12/20 0410 04/13/20 0209 04/14/20 0549 04/16/20 0236  WBC 12.6* 12.6* 9.5 16.2* 10.0  HGB 8.1* 7.8* 6.8* 8.8* 8.0*  HCT 25.8* 25.2* 21.9* 27.3* 25.3*  MCV 89.9 89.4 87.3 85.8 85.8  PLT 302 377 352 403* 503*   Basic Metabolic Panel: Recent Labs  Lab 04/11/20 0341 04/12/20 0410 04/13/20 0209 04/14/20 0549 04/16/20 0236  NA 139 137 136 136 135  K 4.0 4.8 4.9 4.0 5.0  CL 98 96* 97* 97* 95*  CO2 25 24 24 25 24   GLUCOSE 109* 101* 144* 131* 129*  BUN 28* 39* 49* 34* 52*  CREATININE 5.29* 6.62* 7.66* 5.70* 8.49*  CALCIUM 8.3* 8.7* 8.0* 8.4* 8.5*  PHOS  --   --   --   --  4.6    Studies: No results found.  Scheduled Meds: . sodium chloride   Intravenous Once  . allopurinol  100 mg Oral Daily  . atorvastatin  40 mg Oral q1800  . buPROPion   150 mg Oral BID  . calcitRIOL  2.25 mcg Oral Q T,Th,Sat-1800  . Chlorhexidine Gluconate Cloth  6 each Topical Q0600  . cinacalcet  30 mg Oral Q T,Th,Sat-1800  . gabapentin  300 mg Oral QHS  . insulin aspart  0-5 Units Subcutaneous QHS  . insulin aspart  0-9 Units Subcutaneous TID WC  . insulin aspart  2 Units Subcutaneous TID WC  . latanoprost  1 drop Both Eyes QHS  . linaclotide  72 mcg Oral QAC breakfast  . loratadine  10 mg Oral Daily  . metoprolol tartrate  25 mg Oral BID  . pantoprazole  40 mg Oral Daily  . polyethylene glycol  17 g Oral BID  . senna-docusate  1 tablet Oral BID  . sodium chloride irrigation  1,000 mL Irrigation Once  . sucroferric oxyhydroxide  1,000 mg Oral  TID WC   Continuous Infusions:  PRN Meds: acetaminophen, albuterol, mometasone-formoterol, nitroGLYCERIN, oxyCODONE  Time spent: 25 minutes

## 2020-04-16 NOTE — Progress Notes (Signed)
Would recommend a wet-to-dry dressing change daily to the left open metatarsal wound with Dakin's.  Will arrange follow-up in 1 to 2 weeks with Dr. Donzetta Matters for wound check.  High risk that this would will not heal and she may require higher level amputation but will allow demarcate.  Dr. Donzetta Matters plans address her right lower extremity as an outpatient.  Marty Heck, MD [facility] Office: [place]

## 2020-04-16 NOTE — Plan of Care (Signed)
  Problem: Education: ?Goal: Knowledge of General Education information will improve ?Description: Including pain rating scale, medication(s)/side effects and non-pharmacologic comfort measures ?Outcome: Progressing ?  ?Problem: Health Behavior/Discharge Planning: ?Goal: Ability to manage health-related needs will improve ?Outcome: Progressing ?  ?Problem: Clinical Measurements: ?Goal: Respiratory complications will improve ?Outcome: Progressing ?  ?Problem: Nutrition: ?Goal: Adequate nutrition will be maintained ?Outcome: Progressing ?  ?Problem: Pain Managment: ?Goal: General experience of comfort will improve ?Outcome: Progressing ?  ?Problem: Safety: ?Goal: Ability to remain free from injury will improve ?Outcome: Progressing ?  ?Problem: Skin Integrity: ?Goal: Risk for impaired skin integrity will decrease ?Outcome: Progressing ?  ?

## 2020-04-16 NOTE — Plan of Care (Signed)
  Problem: Education: Goal: Knowledge of disease and its progression will improve Outcome: Progressing Goal: Individualized Educational Video(s) Outcome: Progressing   Problem: Fluid Volume: Goal: Compliance with measures to maintain balanced fluid volume will improve Outcome: Progressing   Problem: Health Behavior/Discharge Planning: Goal: Ability to manage health-related needs will improve Outcome: Progressing   Problem: Nutritional: Goal: Ability to make healthy dietary choices will improve Outcome: Progressing   Problem: Clinical Measurements: Goal: Complications related to the disease process, condition or treatment will be avoided or minimized Outcome: Progressing   Problem: Education: Goal: Knowledge of General Education information will improve Description: Including pain rating scale, medication(s)/side effects and non-pharmacologic comfort measures Outcome: Progressing   Problem: Health Behavior/Discharge Planning: Goal: Ability to manage health-related needs will improve Outcome: Progressing   Problem: Clinical Measurements: Goal: Ability to maintain clinical measurements within normal limits will improve Outcome: Progressing Goal: Will remain free from infection Outcome: Progressing Goal: Diagnostic test results will improve Outcome: Progressing Goal: Respiratory complications will improve Outcome: Progressing Goal: Cardiovascular complication will be avoided Outcome: Progressing   Problem: Activity: Goal: Risk for activity intolerance will decrease Outcome: Progressing   Problem: Nutrition: Goal: Adequate nutrition will be maintained Outcome: Progressing   Problem: Coping: Goal: Level of anxiety will decrease Outcome: Progressing   Problem: Elimination: Goal: Will not experience complications related to bowel motility Outcome: Progressing Goal: Will not experience complications related to urinary retention Outcome: Progressing   Problem: Pain  Managment: Goal: General experience of comfort will improve Outcome: Progressing   Problem: Safety: Goal: Ability to remain free from injury will improve Outcome: Progressing   Problem: Skin Integrity: Goal: Risk for impaired skin integrity will decrease Outcome: Progressing   

## 2020-04-16 NOTE — Progress Notes (Signed)
Occupational Therapy Treatment Patient Details Name: [name] [name] MRN: [id number] DOB: 26-Feb-1951 Today's Date: 04/16/2020    History of present illness Patient is a 69 y/o female admitted 04/03/20 with lower GI bleed due to duodenal AVM and fever, SIRS-like picture. S/p L great toe amputation revision on 4/14. Increased R foot swelling 4/15, CT showed diffuse cellulitis, no evidence of osteomyelitis. 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   OT comments  Patient continues to make steady progress towards goals in skilled OT session. Patient's session encompassed ADLs at EOB due to increased fatigue, sit<>stand transfers, and bed mobility. Pt remains minimally impulsive in movements at EOB, requiring cues for safety. Pt currently unable to maintain weight bearing status for LLE, despite increased cues and attempts. Due to this, pt total A in order to complete peri care at bed level. Pt would continue to benefit from skilled services in order address functional deficits; will continue to follow acutely.    Follow Up Recommendations  SNF;Supervision/Assistance - 24 hour    Equipment Recommendations  Other (comment)(defer to next venue)    Recommendations for Other Services      Precautions / Restrictions Precautions Precautions: Fall Precaution Comments: left great toe amputation; reports no WB restrictions Restrictions Weight Bearing Restrictions: Yes LLE Weight Bearing: Non weight bearing       Mobility Bed Mobility Overal bed mobility: Needs Assistance       Supine to sit: Min guard;HOB elevated Sit to supine: Mod assist;HOB elevated   General bed mobility comments: Assist to bring BLEs back into bed  Transfers Overall transfer level: Needs assistance Equipment used: Rolling walker (2 wheeled) Transfers: Sit to/from Stand Sit to Stand: Max assist;Mod assist          General transfer comment: Pt attempted sit<>stand transfers EOB with therapist x3, pt able to clear bed each time, but only able to hold upright positioning for 5-10 seconds on first trial    Balance Overall balance assessment: Needs assistance Sitting-balance support: Feet supported;No upper extremity supported Sitting balance-Leahy Scale: Fair Sitting balance - Comments: supervision for safety. Total A to donn sock   Standing balance support: During functional activity Standing balance-Leahy Scale: Poor Standing balance comment: Heavy reliance on BUE support and external assist; dependent for pericare                           ADL either performed or assessed with clinical judgement   ADL Overall ADL's : Needs assistance/impaired     Grooming: Set up;Supervision/safety;Sitting;Wash/dry face   Upper Body Bathing: Sitting;Set up Upper Body Bathing Details (indicate cue type and reason): EOB Lower Body Bathing: Sitting/lateral leans;Set up;Min guard   Upper Body Dressing : Minimal assistance;Sitting Upper Body Dressing Details (indicate cue type and reason): Don gown Lower Body Dressing: Total assistance Lower Body Dressing Details (indicate cue type and reason): Total A for donning socks   Toilet Transfer Details (indicate cue type and reason): Attempted 3 sit<>stand x3, unable to maintain balance in standing Toileting- Clothing Manipulation and Hygiene: Total assistance Toileting - Clothing Manipulation Details (indicate cue type and reason): Rolling to complete peri care as pt was unable to balance to complete independently     Functional mobility during ADLs: Moderate assistance;Rolling walker General ADL Comments: pt demonstrating decreased ROM, strength, balance, and acitvity tolerance. Attempted sit<>stands 3x, but unable to maintain WB  precautions and maintain balance     Vision       Perception     Praxis      Cognition Arousal/Alertness:  Awake/alert Behavior During Therapy: WFL for tasks assessed/performed Overall Cognitive Status: No family/caregiver present to determine baseline cognitive functioning Area of Impairment: Attention;Following commands;Safety/judgement;Awareness;Problem solving                   Current Attention Level: Selective   Following Commands: Follows one step commands consistently;Follows one step commands with increased time Safety/Judgement: Decreased awareness of safety;Decreased awareness of deficits Awareness: Emergent Problem Solving: Requires verbal cues General Comments: Remains tangential in conversation, but participatory        Exercises     Shoulder Instructions       General Comments      Pertinent Vitals/ Pain       Pain Assessment: Faces Faces Pain Scale: Hurts little more Pain Location: LLE Pain Descriptors / Indicators: Discomfort Pain Intervention(s): Limited activity within patient's tolerance;Repositioned;Monitored during session  Home Living                                          Prior Functioning/Environment              Frequency  Min 2X/week        Progress Toward Goals  OT Goals(current goals can now be found in the care plan section)  Progress towards OT goals: Progressing toward goals  Acute Rehab OT Goals Patient Stated Goal: to return to rehab to get back to independence OT Goal Formulation: With patient Time For Goal Achievement: 04/24/20 Potential to Achieve Goals: Good  Plan Discharge plan remains appropriate    Co-evaluation                 AM-PAC OT "6 Clicks" Daily Activity     Outcome Measure   Help from another person eating meals?: A Little Help from another person taking care of personal grooming?: A Little Help from another person toileting, which includes using toliet, bedpan, or urinal?: A Lot Help from another person bathing (including washing, rinsing, drying)?: A Lot Help from  another person to put on and taking off regular upper body clothing?: A Little Help from another person to put on and taking off regular lower body clothing?: A Lot 6 Click Score: 15    End of Session Equipment Utilized During Treatment: Rolling walker;Gait belt  OT Visit Diagnosis: Unsteadiness on feet (R26.81);Other abnormalities of gait and mobility (R26.89);Muscle weakness (generalized) (M62.81);Pain Pain - Right/Left: Left Pain - part of body: Ankle and joints of foot   Activity Tolerance Patient limited by pain;Patient limited by fatigue   Patient Left in bed;with call bell/phone within reach   Nurse Communication Mobility status        Time: 0940-7680 OT Time Calculation (min): 28 min  Charges: OT General Charges $OT Visit: 1 Visit OT Treatments $Self Care/Home Management : 23-37 mins  Cromwell. Ataya Murdy, COTA/L Acute Rehabilitation Services [place] 04/16/2020, 2:33 PM

## 2020-04-17 LAB — CULTURE, BLOOD (ROUTINE X 2)
Culture: NO GROWTH
Culture: NO GROWTH
Special Requests: ADEQUATE
Special Requests: ADEQUATE

## 2020-04-17 LAB — RENAL FUNCTION PANEL
Albumin: 2.1 g/dL — ABNORMAL LOW (ref 3.5–5.0)
Anion gap: 15 (ref 5–15)
BUN: 50 mg/dL — ABNORMAL HIGH (ref 8–23)
CO2: 22 mmol/L (ref 22–32)
Calcium: 8.5 mg/dL — ABNORMAL LOW (ref 8.9–10.3)
Chloride: 99 mmol/L (ref 98–111)
Creatinine, Ser: 6.88 mg/dL — ABNORMAL HIGH (ref 0.44–1.00)
GFR calc Af Amer: 6 mL/min — ABNORMAL LOW (ref >60)
GFR calc non Af Amer: 6 mL/min — ABNORMAL LOW (ref >60)
Glucose, Bld: 175 mg/dL — ABNORMAL HIGH (ref 70–99)
Phosphorus: 4 mg/dL (ref 2.5–4.6)
Potassium: 4.4 mmol/L (ref 3.5–5.1)
Sodium: 136 mmol/L (ref 135–145)

## 2020-04-17 LAB — CBC
HCT: 29.3 % — ABNORMAL LOW (ref 36.0–46.0)
HCT: 28.5 % — ABNORMAL LOW (ref 36.0–46.0)
Hemoglobin: 9 g/dL — ABNORMAL LOW (ref 12.0–15.0)
Hemoglobin: 8.7 g/dL — ABNORMAL LOW (ref 12.0–15.0)
MCH: 27.2 pg (ref 26.0–34.0)
MCH: 27 pg (ref 26.0–34.0)
MCHC: 30.7 g/dL (ref 30.0–36.0)
MCHC: 30.5 g/dL (ref 30.0–36.0)
MCV: 88.5 fL (ref 80.0–100.0)
MCV: 88.5 fL (ref 80.0–100.0)
Platelets: 442 10*3/uL — ABNORMAL HIGH (ref 150–400)
Platelets: 434 10*3/uL — ABNORMAL HIGH (ref 150–400)
RBC: 3.31 MIL/uL — ABNORMAL LOW (ref 3.87–5.11)
RBC: 3.22 MIL/uL — ABNORMAL LOW (ref 3.87–5.11)
RDW: 19 % — ABNORMAL HIGH (ref 11.5–15.5)
RDW: 19.4 % — ABNORMAL HIGH (ref 11.5–15.5)
WBC: 10.1 10*3/uL (ref 4.0–10.5)
WBC: 10.4 10*3/uL (ref 4.0–10.5)
nRBC: 1.5 % — ABNORMAL HIGH (ref 0.0–0.2)
nRBC: 0.9 % — ABNORMAL HIGH (ref 0.0–0.2)

## 2020-04-17 LAB — GLUCOSE, CAPILLARY
Glucose-Capillary: 133 mg/dL — ABNORMAL HIGH (ref 70–99)
Glucose-Capillary: 97 mg/dL (ref 70–99)
Glucose-Capillary: 123 mg/dL — ABNORMAL HIGH (ref 70–99)

## 2020-04-17 MED ORDER — LIDOCAINE-PRILOCAINE 2.5-2.5 % EX CREA
1.0000 "application " | TOPICAL_CREAM | CUTANEOUS | Status: DC | PRN
  Filled 2020-04-17: qty 5

## 2020-04-17 MED ORDER — HEPARIN SODIUM (PORCINE) 1000 UNIT/ML DIALYSIS
1000.0000 [IU] | INTRAMUSCULAR | Status: DC | PRN
  Filled 2020-04-17: qty 1

## 2020-04-17 MED ORDER — SODIUM CHLORIDE 0.9 % IV SOLN: 100.0000 mL | INTRAVENOUS | Status: DC | PRN

## 2020-04-17 MED ORDER — LIDOCAINE HCL (PF) 1 % IJ SOLN: 5.0000 mL | INTRAMUSCULAR | Status: DC | PRN

## 2020-04-17 MED ORDER — OXYCODONE HCL 5 MG PO TABS: 5.0000 mg | ORAL_TABLET | Freq: Four times a day (QID) | ORAL | 0 refills | Status: AC | PRN

## 2020-04-17 MED ORDER — PENTAFLUOROPROP-TETRAFLUOROETH EX AERO: 1.0000 "application " | INHALATION_SPRAY | CUTANEOUS | Status: DC | PRN

## 2020-04-17 MED ORDER — ALTEPLASE 2 MG IJ SOLR: 2.0000 mg | Freq: Once | INTRAMUSCULAR | Status: DC | PRN

## 2020-04-17 NOTE — Plan of Care (Signed)
  Problem: Clinical Measurements: Goal: Ability to maintain clinical measurements within normal limits will improve Outcome: Progressing Goal: Diagnostic test results will improve Outcome: Progressing   Problem: Activity: Goal: Risk for activity intolerance will decrease Outcome: Progressing   Problem: Coping: Goal: Level of anxiety will decrease Outcome: Progressing   Problem: Pain Managment: Goal: General experience of comfort will improve Outcome: Progressing   Problem: Safety: Goal: Ability to remain free from injury will improve Outcome: Progressing   Problem: Skin Integrity: Goal: Risk for impaired skin integrity will decrease Outcome: Progressing

## 2020-04-17 NOTE — Progress Notes (Signed)
[facility] Progress Note  Patient: [name] [name]    UJW:[id number]  DOA: 04/03/2020     Date of Service: the patient was seen and examined on 04/17/2020  Chief Complaint  Patient presents with  . Abnormal Lab   Brief hospital course: Past medical history of ESRD on HD, CAD S/P CABG, combined CHF, type II DM, CVA.  Recurrent anemia.  Patient presented with complaints of abnormal hemoglobin from hemodialysis.  She was transfused PRBC. After that she started having fever and tachycardia.  Patient was transferred to [facility] for hemodialysis need and continuous observation for her sepsis-like picture.  Patient continues to have low hemoglobin and therefore GI was consulted.  Patient has refused bowel prep x2 so far. GI has signed off. 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  Also found to have foot osteomyelitis needing surgical resection. 4/14, patient underwent left great toe amputation and revision. Blood cultures negative. 4/15, temperature overnight.  Extremely tender right foot with swelling. CT scan with diffuse cellulitis, no localized collection and no evidence of osteomyelitis.  Continue antibiotics. 4/16, hemoglobin less than 7.  No active bleeding.  2 units transfused with appropriate response.  Assessment and Plan: 1.  Acute upper GI bleeding: S/P EGD, duodenal AVM treated with APC Primary reason for patient's presentation.  Patient reported hematochezia. 04/03/2020, 2 units PRBC hemoglobin responded. 04/13/2020, hemoglobin less than 7, 2 units of PRBC with appropriate response. Patient has refused colonoscopy prep x2. Not a candidate for capsule endoscopy given no bowel prep. Remains at risk for further GI bleed. Prior colonoscopy last February was suggestive of an AVM treated with clips. On PPI daily.  2.  ESRD on HD Admitted to [facility] for ESRD. Continue HD appreciate nephrology input.  Getting dialysis on her schedule.  3.  History of  combined chronic CHF Currently euvolemic.  Monitor.  4.  Type 2 diabetes mellitus with renal complication.  Uncontrolled with hyperglycemia Currently on sliding scale insulin.  Well-controlled.  5.  Essential hypertension. Blood pressure stable.  Monitor.  6.  Osteomyelitis left foot status post left great toe amputation and revision.  Dry gangrene right foot with surrounding cellulitis: Bilateral peripheral vascular disease. Left foot status post surgical resection and open wound followed by surgery, looks with poor base.  Hoping for recovery.  On local wound care now.  Right foot with dry gangrene's of the toes, swelling and inflammation, no evidence of osteomyelitis.  Vascular surgery planning for right lower extremity angiogram and appropriate level of surgical treatment.  She finished antibiotic therapy. Left open wound, twice daily Dakin's dressing as suggested. Right foot, follow-up with Dr. Donzetta Matters in 2 weeks. Overall both feet looks like they have very poor chance of recovery.  7. Morbid obesity. Body mass index is 39.7 kg/m.   Diet: Renal diet DVT Prophylaxis: SCD, pharmacological prophylaxis contraindicated due to Due to GI bleed   Advance goals of care discussion: Full code  Family Communication: Patient talking to family.  Status is: Inpatient  Remains inpatient appropriate because: Stable to go to skilled nursing.   Dispo: The patient is from: Home              Anticipated d/c is to: SNF              Anticipated d/c date is: When bed available.              Patient currently stable.    Subjective: Patient was seen and  examined.  No overnight events.  Right foot also hurts. Physical Exam: Physical Exam  Constitutional:  Not in any distress.  Chronically sick looking.  On room air.  HENT:  Head: Normocephalic.  Cardiovascular: Normal rate and regular rhythm.  Respiratory:  Right chest wall permacath present.  Musculoskeletal:     Cervical back: Neck  supple.     Comments: Left foot with poor peripheral circulation, dressing changed by surgery, picture available in the chart.  Open wound with slough. Right foot with gangrene of the toes, tenderness and swelling circumferential.  Tender to touch.  Vitals:   04/16/20 1103 04/16/20 1459 04/16/20 2101 04/17/20 0441  BP: 105/66 115/62 (!) 127/52 122/64  Pulse: 67 80 63 72  Resp: 18 17 18 18   Temp: 98.9 F (37.2 C) 98.6 F (37 C) 100.2 F (37.9 C) 98 F (36.7 C)  TempSrc: Oral Oral Oral Oral  SpO2: 94% 93% 100% 99%  Weight: 118.4 kg     Height:        Intake/Output Summary (Last 24 hours) at 04/17/2020 1115 Last data filed at 04/16/2020 1400 Gross per 24 hour  Intake 280 ml  Output --  Net 280 ml   Filed Weights   04/13/20 0700 04/16/20 0701 04/16/20 1103  Weight: 122.5 kg 122.3 kg 118.4 kg    Data Reviewed: I have personally reviewed and interpreted daily labs, tele strips, imagings as discussed above. I reviewed all nursing notes, pharmacy notes, vitals, pertinent old records I have discussed plan of care as described above with RN and patient/family.  CBC: Recent Labs  Lab 04/12/20 0410 04/13/20 0209 04/14/20 0549 04/16/20 0236 04/17/20 0348  WBC 12.6* 9.5 16.2* 10.0 10.1  HGB 7.8* 6.8* 8.8* 8.0* 9.0*  HCT 25.2* 21.9* 27.3* 25.3* 29.3*  MCV 89.4 87.3 85.8 85.8 88.5  PLT 377 352 403* 437* 585*   Basic Metabolic Panel: Recent Labs  Lab 04/11/20 0341 04/12/20 0410 04/13/20 0209 04/14/20 0549 04/16/20 0236  NA 139 137 136 136 135  K 4.0 4.8 4.9 4.0 5.0  CL 98 96* 97* 97* 95*  CO2 25 24 24 25 24   GLUCOSE 109* 101* 144* 131* 129*  BUN 28* 39* 49* 34* 52*  CREATININE 5.29* 6.62* 7.66* 5.70* 8.49*  CALCIUM 8.3* 8.7* 8.0* 8.4* 8.5*  PHOS  --   --   --   --  4.6    Studies: No results found.  Scheduled Meds: . sodium chloride   Intravenous Once  . allopurinol  100 mg Oral Daily  . atorvastatin  40 mg Oral q1800  . buPROPion  150 mg Oral BID  .  calcitRIOL  2.25 mcg Oral Q T,Th,Sat-1800  . Chlorhexidine Gluconate Cloth  6 each Topical Q0600  . cinacalcet  30 mg Oral Q T,Th,Sat-1800  . gabapentin  300 mg Oral QHS  . insulin aspart  0-5 Units Subcutaneous QHS  . insulin aspart  0-9 Units Subcutaneous TID WC  . insulin aspart  2 Units Subcutaneous TID WC  . latanoprost  1 drop Both Eyes QHS  . linaclotide  72 mcg Oral QAC breakfast  . loratadine  10 mg Oral Daily  . metoprolol tartrate  25 mg Oral BID  . pantoprazole  40 mg Oral Daily  . polyethylene glycol  17 g Oral BID  . senna-docusate  1 tablet Oral BID  . sodium chloride irrigation  1,000 mL Irrigation Once  . sucroferric oxyhydroxide  1,000 mg Oral TID WC   Continuous  Infusions:  PRN Meds: acetaminophen, albuterol, mometasone-formoterol, nitroGLYCERIN, oxyCODONE  Time spent: 25 minutes

## 2020-04-17 NOTE — Progress Notes (Signed)
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 S/p L great toe amputation revision on 4/14. Increased R foot swelling 4/15, CT showed diffuse cellulitis, no evidence of osteomyelitis. 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    PT Comments    Pt continuing to work on transfers and pregait activities. In standing, she in unable to safely wt shift to step and can maintain standing for only 10 secs due to fatigue and foot pain. Performed seated LE there ex in chair. Transferred with squat pivot from chair back to bed with mod A +2. Required max A +2 for multiple sit to stand. PT will continue to follow.    Follow Up Recommendations  SNF;Supervision/Assistance - 24 hour     Equipment Recommendations  None recommended by PT    Recommendations for Other Services       Precautions / Restrictions Precautions Precautions: Fall Precaution Comments: left great toe amputation; reports no WB restrictions Restrictions Weight Bearing Restrictions: Yes LLE Weight Bearing: Weight bearing as tolerated    Mobility  Bed Mobility Overal bed mobility: Needs Assistance Bed Mobility: Sit to Supine       Sit to supine: Mod assist;HOB elevated   General bed mobility comments: Assist to bring BLEs back into bed  Transfers Overall transfer level: Needs assistance Equipment used: Rolling walker (2 wheeled);2 person hand held assist Transfers: Sit to/from [employer] Sit to Stand: Max assist;+2 physical assistance   Squat pivot transfers: +2 physical assistance;Mod assist     General transfer comment: pt performed sit<>stand 4x, once with RW and other 3x with B HHA. Pt  could achieve full standing but could not step feet and could maintain standing only 10 sec before needing to return to sitting. Chair turned so pt could transfer to R and use bed rail. Mod A +2 for squat pivot position  Ambulation/Gait             General Gait Details: unable. Could not wt shift in standing to step feet   Stairs             Wheelchair Mobility    Modified Rankin (Stroke Patients Only)       Balance Overall balance assessment: Needs assistance Sitting-balance support: Feet supported;No upper extremity supported Sitting balance-Leahy Scale: Fair Sitting balance - Comments: supervision for safety. Able to wt shift to scoot hips   Standing balance support: During functional activity Standing balance-Leahy Scale: Poor Standing balance comment: Heavy reliance on BUE support and external assist                            Cognition Arousal/Alertness: Awake/alert Behavior During Therapy: WFL for tasks assessed/performed Overall Cognitive Status: No family/caregiver present to determine baseline cognitive functioning Area of Impairment: Safety/judgement;Awareness;Problem solving                       Following Commands: Follows one step commands consistently;Follows one step commands with increased time Safety/Judgement: Decreased awareness of safety;Decreased awareness of deficits Awareness: Emergent Problem Solving: Requires verbal cues General Comments: sometimes internally distracted but was able to focus on session today and discuss  goals and safety. Pt in agreement that she would not be safe at home with her husband      Exercises Total Joint Exercises Bridges: AROM;5 reps General Exercises - Lower Extremity Ankle Circles/Pumps: AROM;Both;20 reps;Seated Long Arc Quad: AROM;Both;10 reps;Seated Heel Slides: AROM;Both;10 reps;Seated    General Comments General comments (skin integrity, edema, etc.): pt reports that based on  the health of her husband she needs to be independent with transfers to go home      Pertinent Vitals/Pain Pain Assessment: Faces Faces Pain Scale: Hurts even more Pain Location: both feet Pain Descriptors / Indicators: Discomfort;Grimacing Pain Intervention(s): Limited activity within patient's tolerance;Monitored during session    Home Living                      Prior Function            PT Goals (current goals can now be found in the care plan section) Acute Rehab PT Goals Patient Stated Goal: to return to rehab to get back to independence PT Goal Formulation: With patient Time For Goal Achievement: 04/23/20 Potential to Achieve Goals: Good Progress towards PT goals: Progressing toward goals    Frequency    Min 2X/week      PT Plan Current plan remains appropriate    Co-evaluation              AM-PAC PT "6 Clicks" Mobility   Outcome Measure  Help needed turning from your back to your side while in a flat bed without using bedrails?: A Little Help needed moving from lying on your back to sitting on the side of a flat bed without using bedrails?: A Lot Help needed moving to and from a bed to a chair (including a wheelchair)?: A Lot Help needed standing up from a chair using your arms (e.g., wheelchair or bedside chair)?: A Lot Help needed to walk in hospital room?: Total Help needed climbing 3-5 steps with a railing? : Total 6 Click Score: 11    End of Session Equipment Utilized During Treatment: Gait belt Activity Tolerance: Patient limited by fatigue Patient left: with call bell/phone within reach;in bed;with bed alarm set Nurse Communication: Mobility status PT Visit Diagnosis: Pain;Muscle weakness (generalized) (M62.81);Unsteadiness on feet (R26.81);Difficulty in walking, not elsewhere classified (R26.2) Pain - Right/Left: Left Pain - part of body: Ankle and joints of foot;Leg     Time: 1040-1103 PT Time Calculation (min) (ACUTE ONLY):  23 min  Charges:  $Gait Training: 8-22 mins $Therapeutic Activity: 8-22 mins                     Leighton Roach, [place]  Pager [phone] Office [place] 04/17/2020, 11:33 AM

## 2020-04-17 NOTE — Progress Notes (Signed)
[name] [name] Progress Note   Subjective: No new complaints. HD today on schedule  Objective Vitals:   04/16/20 1103 04/16/20 1459 04/16/20 2101 04/17/20 0441  BP: 105/66 115/62 (!) 127/52 122/64  Pulse: 67 80 63 72  Resp: 18 17 18 18   Temp: 98.9 F (37.2 C) 98.6 F (37 C) 100.2 F (37.9 C) 98 F (36.7 C)  TempSrc: Oral Oral Oral Oral  SpO2: 94% 93% 100% 99%  Weight: 118.4 kg     Height:       Physical Exam General:Pleasant obese older female in NAD Heart:S1,S2 No M/R/G Lungs:CTAB Abdomen:obese, NT Extremities:1+BLE edema.Ace wrap both feet Dialysis Access:L AVF + bruit RIJ TDC drsg CDI    Additional Objective Labs: Basic Metabolic Panel: Recent Labs  Lab 04/13/20 0209 04/14/20 0549 04/16/20 0236  NA 136 136 135  K 4.9 4.0 5.0  CL 97* 97* 95*  CO2 24 25 24   GLUCOSE 144* 131* 129*  BUN 49* 34* 52*  CREATININE 7.66* 5.70* 8.49*  CALCIUM 8.0* 8.4* 8.5*  PHOS  --   --  4.6   Liver Function Tests: Recent Labs  Lab 04/16/20 0236  ALBUMIN 2.0*   No results for input(s): LIPASE, AMYLASE in the last 168 hours. CBC: Recent Labs  Lab 04/12/20 0410 04/12/20 0410 04/13/20 0209 04/13/20 0209 04/14/20 0549 04/16/20 0236 04/17/20 0348  WBC 12.6*   < > 9.5   < > 16.2* 10.0 10.1  HGB 7.8*   < > 6.8*   < > 8.8* 8.0* 9.0*  HCT 25.2*   < > 21.9*   < > 27.3* 25.3* 29.3*  MCV 89.4  --  87.3  --  85.8 85.8 88.5  PLT 377   < > 352   < > 403* 437* 442*   < > = values in this interval not displayed.   Blood Culture    Component Value Date/Time   SDES BLOOD RIGHT HAND 04/12/2020 1651   SPECREQUEST AEROBIC BOTTLE ONLY Blood Culture adequate volume 04/12/2020 1651   CULT  04/12/2020 1651    NO GROWTH 5 DAYS Performed at [facility], [place] [address], [place], [place] [address]    REPTSTATUS 04/17/2020 FINAL 04/12/2020 1651    Cardiac Enzymes: No results for input(s): CKTOTAL, CKMB, CKMBINDEX, TROPONINI in the last 168  hours. CBG: Recent Labs  Lab 04/15/20 2104 04/16/20 1203 04/16/20 1714 04/16/20 2105 04/17/20 0729  GLUCAP 139* 83 202* 161* 133*   Iron Studies: No results for input(s): IRON, TIBC, TRANSFERRIN, FERRITIN in the last 72 hours. @lablastinr3 @ Studies/Results: No results found. Medications: . sodium chloride    . sodium chloride     . sodium chloride   Intravenous Once  . allopurinol  100 mg Oral Daily  . atorvastatin  40 mg Oral q1800  . buPROPion  150 mg Oral BID  . calcitRIOL  2.25 mcg Oral Q T,Th,Sat-1800  . Chlorhexidine Gluconate Cloth  6 each Topical Q0600  . cinacalcet  30 mg Oral Q T,Th,Sat-1800  . gabapentin  300 mg Oral QHS  . insulin aspart  0-5 Units Subcutaneous QHS  . insulin aspart  0-9 Units Subcutaneous TID WC  . insulin aspart  2 Units Subcutaneous TID WC  . latanoprost  1 drop Both Eyes QHS  . linaclotide  72 mcg Oral QAC breakfast  . loratadine  10 mg Oral Daily  . metoprolol tartrate  25 mg Oral BID  . pantoprazole  40 mg Oral Daily  .  polyethylene glycol  17 g Oral BID  . senna-docusate  1 tablet Oral BID  . sodium chloride irrigation  1,000 mL Irrigation Once  . sucroferric oxyhydroxide  1,000 mg Oral TID WC     Dialysis Orders: [facility]  4h98min 400/800 118kg 2K/2.5CaRIJTDC No heparin - Venofer 100mg  IVq HD until 4/17 - Mircera 26mcg IV q 2 weeks(last 4/1) - Sensipar 30mg  PO TIW - Calcitriol 2.62mcg PO TIW  Assessment/Plan: 1. Recurrent GI bleeding:Hx AVMs. 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 She remains high risk for recurrent GIB D/T refusing treatment.  2. ABLA + anemia of ESRD: Hgb 6.8 on admit -> s/p 2U PRBCs, then down again with another 2U given 4/16.Aranesp 200 mcg IVgiven 4/16. HGB 9.0 today.  3. L foot osteomyelitis: ID following- s/p1st toe transmetarsal amputation4/14. Per [facility] note, 50% chance to heal. Blood Cx negative. Off ABX.  4. ESRD: HD today off  schedule. Had HD off schedule 04/19 short HD ordered for today to get back on schedule.  5. Hypertension/volume - BP low/stable.Was able to have 3.7 liters removed in HD yesterday. Left just above OP EDW-118.4 kg. Looks better. UF today as tolerated.  6. Metabolic bone disease -Continue binders/Sensipar/Calcitriol for now 7. Nutrition -Renal diet/prot supp 8. R foot ulcers: [facility] planning for RLE angiogram in future.Needs wound care.  Disposition- awaiting to be stable for discharge to SNF Shahin Knierim H. Tarita Deshmukh NP-C 04/17/2020, 11:56 AM  [employer] [phone]

## 2020-04-17 NOTE — TOC Progression Note (Signed)
Transition of Care [facility]) - Progression Note    Patient Details  Name: [name] [name] MRN: [id number] Date of Birth: Nov 05, 1951  Transition of Care [facility]) CM/SW [place], [place] Phone Number: 04/17/2020, 10:39 AM  Clinical Narrative:    CSW received message from [facility], they will start authorization for pt return. All therapy evaluations sent through, this was confirmed w/ admissions director Freda Munro.    Expected Discharge Plan: [place] Barriers to Discharge: Continued Medical Work up  Expected Discharge Plan and Services Expected Discharge Plan: [place] In-house Referral: Clinical Social Work Discharge Planning Services: CM Consult Post Acute Care Choice: Resumption of Svcs/PTA Provider, [place] arrangements for the past 2 months: Single Family Home Expected Discharge Date: 04/04/20      Readmission Risk Interventions Readmission Risk Prevention Plan 04/05/2020 12/12/2019 10/08/2019  Transportation Screening Complete Complete -  Medication Review [employer]) Complete Complete -  PCP or Specialist appointment within 3-5 days of discharge Not Complete Complete -  PCP/Specialist Appt Not Complete comments facility resident - -  [place] or Home Care Consult Complete - -  SW Recovery Care/Counseling Consult Complete - -  Palliative Care Screening Not Applicable - Not Applicable  Skilled Nursing Facility Complete - -  Some recent data might be hidden

## 2020-04-17 NOTE — Discharge Instructions (Signed)
How to Change Your Wound Dressing A dressing is a material that is placed in and over a wound. A dressing helps your wound heal by protecting it from:  Germs (bacteria).  Another injury.  Getting too dry or too wet. There are several types of wound dressings. Some examples are:  Bandages.  Gauze pads.  Foam pads.  Antimicrobial dressings. These prevent or treat infection and may contain something that kills germs (an antiseptic), such as silver or iodine.  Calcium alginate. These are general guidelines. Follow your doctor's instructions about how to care for your wound. What are the risks? It is usually safe to change your dressing. The sticky (adhesive) tape that is used with a dressing may make your skin sore or irritated, or it may cause a rash. These are the most common problems. However, more serious problems can happen, such as:  Bleeding.  Infection. Supplies needed: Set up a clean area for wound care. You will need:  A trash bag that you can throw away. Have it open and ready to use.  Hand sanitizer.  Cleaning solution as told by your doctor. This may include: ? Germ-free (sterile) water. ? Germ-free salt water (saline). ? Wound cleanser. ? New wound dressing material. Make sure to open the dressing package so the dressing stays on the inside of the package. You may also need the following in your clean area:  A box of vinyl gloves.  Tape.  Adhesive remover.  Skin protectant. This may be a wipe, film, or spray.  Clean or germ-free scissors.  A cotton-tipped applicator. How to change your dressing How often you change your dressing will depend on your wound. Change the dressing as often as told by your doctor. Your doctor may change your dressing, or a family member, friend, or caregiver may be shown how to do it. It is important to:  Wash your hands before and after each dressing change. If you cannot use soap and water, use hand sanitizer.  Wear gloves  and protective clothing while changing a dressing. This may include eye protection.  Never let anyone change your dressing if he or she has an infection, a skin problem, or a skin wound or cut of any size. Preparing to change your dressing  Take a shower before you do the first dressing change of the day. Before you shower, ask your doctor if you should put plastic leak-proof sealing wrap over your dressing to protect it.  If needed, take pain medicine 30 minutes before you change your dressing as told by your doctor.            Taking off your old dressing   Wash your hands with soap and water. Dry your hands with a clean towel. If you cannot use soap and water, use hand sanitizer.  Go to the clean area that you have set up with all the supplies you will need.  If you are using gloves, put the gloves on before you take off the dressing.  Gently take off any adhesive or tape by pulling it off in the direction of your hair growth. Only touch the outside edges of the dressing. ? If you are told to use an adhesive remover to loosen the edges of the dressing, make sure to avoid the wound area.  Take off the dressing. If the dressing sticks to your skin, wet the dressing with the cleaner that your doctor says to use. This helps it come off more easily.  Take  off any gauze or packing in your wound.  Throw the old dressing supplies into the trash bag.  Take off your gloves. To take off each glove, grab the cuff with your other hand and turn the glove inside out. Put the gloves in the trash right away.  Wash your hands with soap and water. Dry your hands with a clean towel. If you cannot use soap and water, use hand sanitizer. Cleaning your wound  Follow instructions from your doctor about how to clean your wound. This may include using the cleaner that your doctor recommends. ? You may need to use germ-free water to clean your wound if you are putting on a dressing that has silver  in it.  Do not use over-the-counter medicated or antiseptic creams, sprays, liquids, or dressings unless your doctor tells you to do that.  Use a clean gauze pad to clean the area fully with the cleaner that your doctor recommends.  Throw the gauze pad into the trash bag.  Wash your hands with soap and water. Dry your hands with a clean towel. If you cannot use soap and water, use hand sanitizer. Putting on the dressing  If your doctor recommended a skin protectant, put it on the skin around the wound.  Gently pack the wound if told by your doctor.  Cover the wound with the recommended dressing. Make sure to touch only the outside edges of the dressing. Do not touch the inside of the dressing.  Attach the dressing so all sides stay in place. You may do this with medical adhesive, roll gauze, or tape. If you use tape, do not wrap the tape all the way around your arm or leg.  Take off your gloves. Put them in the trash bag with the old dressing. Tie the bag shut and throw it away.  Wash your hands with soap and water. Dry your hands with a clean towel. If you cannot use soap and water, use hand sanitizer. Follow these instructions at home: Wound care   Check your wound every day for signs of infection, or as often as told by your doctor. Check for: ? More redness, swelling, or pain. ? More fluid or blood. ? Warmth. ? Pus or a bad smell. General instructions  Take over-the-counter and prescription medicines only as told by your doctor.  Ask your doctor if the medicine prescribed to you: ? Requires you to avoid driving or using heavy machinery. ? Can cause trouble pooping (constipation). You may need to take steps to prevent or treat trouble pooping:  Drink enough fluid to keep your pee (urine) pale yellow.  Take over-the-counter or prescription medicines.  Eat foods that are high in fiber. These include beans, whole grains, and fresh fruits and vegetables.  Limit foods that  are high in fat and sugar. These include fried or sweet foods.  Keep all follow-up visits as told by your doctor. This is important. Contact a doctor if:  You have new pain.  You have irritation, a rash, or itching around the wound or dressing.  It hurts to change your dressing.  Changing your dressing causes a lot of bleeding. Get help right away if:  You have very bad pain.  You have signs of infection, such as: ? More redness, swelling, or pain. ? More fluid or blood. ? Warmth. ? Pus or a bad smell. ? Red streaks leading from the wound. ? A fever. Summary  A dressing is a material that is placed  in and over a wound.  A dressing helps your wound heal.  Wash your hands before and after each dressing change.  Follow your doctor's instructions about how to care for your wound.  Check your wound for signs of infection. This information is not intended to replace advice given to you by your health care provider. Make sure you discuss any questions you have with your health care provider. Document Revised: 08/12/2018 Document Reviewed: 08/12/2018 Elsevier Patient Education  2020 [place] to dry dressing changes to left 1st toe amputation site using Dakin's solution. Dry dressings to right foot. Place dry gauze between toes

## 2020-04-17 NOTE — Progress Notes (Signed)
  Progress Note    04/17/2020 8:00 AM 6 Days Post-Op  Subjective:  States that right foot more painful then left foot. No other complaints   Vitals:   04/16/20 2101 04/17/20 0441  BP: (!) 127/52 122/64  Pulse: 63 72  Resp: 18 18  Temp: 100.2 F (37.9 C) 98 F (36.7 C)  SpO2: 100% 99%   Physical Exam: General: well appearing, not in any distress, sitting up in chair Lungs:  Non labored Incisions:  Left 1st transmetatarsal amputation site with marginal tissue and still some green drainage. Dakins wet- to dry dressings reapplied Extremities:  Right toes with dry gangrenous changes and some maceration of toes Abdomen:  Obese, non tender Neurologic: alert and oriented  CBC    Component Value Date/Time   WBC 10.1 04/17/2020 0348   RBC 3.31 (L) 04/17/2020 0348   HGB 9.0 (L) 04/17/2020 0348   HCT 29.3 (L) 04/17/2020 0348   HCT 22.0 (L) 10/06/2019 0021   PLT 442 (H) 04/17/2020 0348   MCV 88.5 04/17/2020 0348   MCH 27.2 04/17/2020 0348   MCHC 30.7 04/17/2020 0348   RDW 19.0 (H) 04/17/2020 0348   LYMPHSABS 1.0 04/09/2020 0744   MONOABS 1.3 (H) 04/09/2020 0744   EOSABS 0.1 04/09/2020 0744   BASOSABS 0.1 04/09/2020 0744    BMET    Component Value Date/Time   NA 135 04/16/2020 0236   K 5.0 04/16/2020 0236   CL 95 (L) 04/16/2020 0236   CO2 24 04/16/2020 0236   GLUCOSE 129 (H) 04/16/2020 0236   BUN 52 (H) 04/16/2020 0236   CREATININE 8.49 (H) 04/16/2020 0236   CREATININE 2.62 (H) 04/29/2016 1629   CALCIUM 8.5 (L) 04/16/2020 0236   GFRNONAA 4 (L) 04/16/2020 0236   GFRAA 5 (L) 04/16/2020 0236    INR    Component Value Date/Time   INR 1.3 (H) 04/09/2020 0744     Intake/Output Summary (Last 24 hours) at 04/17/2020 0800 Last data filed at 04/16/2020 1400 Gross per 24 hour  Intake 280 ml  Output 3712 ml  Net -3432 ml     Assessment/Plan:  69 y.o. female is s/p left 1st toe transmetatarsal amputation 6 Days Post-Op. Dakins wet to dry dressings of the left 1st toe  wound. Will allow left foot to demarcate. She will follow up with Dr. Donzetta Matters in 1-2 weeks for wound check and to discuss further plans for both extremities   Karoline Caldwell, PA-C [facility] [phone] 04/17/2020 8:00 AM

## 2020-04-18 LAB — GLUCOSE, CAPILLARY
Glucose-Capillary: 168 mg/dL — ABNORMAL HIGH (ref 70–99)
Glucose-Capillary: 266 mg/dL — ABNORMAL HIGH (ref 70–99)
Glucose-Capillary: 60 mg/dL — ABNORMAL LOW (ref 70–99)
Glucose-Capillary: 70 mg/dL (ref 70–99)
Glucose-Capillary: 144 mg/dL — ABNORMAL HIGH (ref 70–99)

## 2020-04-18 LAB — CBC
HCT: 27.1 % — ABNORMAL LOW (ref 36.0–46.0)
Hemoglobin: 8.2 g/dL — ABNORMAL LOW (ref 12.0–15.0)
MCH: 26.2 pg (ref 26.0–34.0)
MCHC: 30.3 g/dL (ref 30.0–36.0)
MCV: 86.6 fL (ref 80.0–100.0)
Platelets: 349 10*3/uL (ref 150–400)
RBC: 3.13 MIL/uL — ABNORMAL LOW (ref 3.87–5.11)
RDW: 19.1 % — ABNORMAL HIGH (ref 11.5–15.5)
WBC: 9.2 10*3/uL (ref 4.0–10.5)
nRBC: 1.2 % — ABNORMAL HIGH (ref 0.0–0.2)

## 2020-04-18 MED ORDER — DARBEPOETIN ALFA 200 MCG/0.4ML IJ SOSY: 200.0000 ug | PREFILLED_SYRINGE | INTRAMUSCULAR | Status: DC

## 2020-04-18 NOTE — Progress Notes (Signed)
[facility] Progress Note   Subjective: Seen by VVS this AM. R foot not healing (Please see VVS note photo), No other C/Os.   Objective Vitals:   04/17/20 1459 04/17/20 1630 04/17/20 2207 04/18/20 0435  BP: 112/61 119/70 (!) 124/50 (!) 113/58  Pulse: 66 74 70 72  Resp: 19 19 18 18   Temp: 98 F (36.7 C) 98.7 F (37.1 C) 99.3 F (37.4 C) 99.1 F (37.3 C)  TempSrc: Oral Oral Oral Oral  SpO2: 99% 100% 99% 96%  Weight: 118.4 kg     Height:       Physical Exam General:Pleasant obese older female in NAD Heart:S1,S2 No M/R/G Lungs:CTAB Abdomen:obese, NT Extremities:Trace RLE edema.Ace wrapboth feet Dialysis Access:L AVF + bruit RIJ TDC drsg CDI   Additional Objective Labs: Basic Metabolic Panel: Recent Labs  Lab 04/14/20 0549 04/16/20 0236 04/17/20 1220  NA 136 135 136  K 4.0 5.0 4.4  CL 97* 95* 99  CO2 25 24 22   GLUCOSE 131* 129* 175*  BUN 34* 52* 50*  CREATININE 5.70* 8.49* 6.88*  CALCIUM 8.4* 8.5* 8.5*  PHOS  --  4.6 4.0   Liver Function Tests: Recent Labs  Lab 04/16/20 0236 04/17/20 1220  ALBUMIN 2.0* 2.1*   No results for input(s): LIPASE, AMYLASE in the last 168 hours. CBC: Recent Labs  Lab 04/14/20 0549 04/14/20 0549 04/16/20 0236 04/16/20 0236 04/17/20 0348 04/17/20 1220 04/18/20 0244  WBC 16.2*   < > 10.0   < > 10.1 10.4 9.2  HGB 8.8*   < > 8.0*   < > 9.0* 8.7* 8.2*  HCT 27.3*   < > 25.3*   < > 29.3* 28.5* 27.1*  MCV 85.8  --  85.8  --  88.5 88.5 86.6  PLT 403*   < > 437*   < > 442* 434* 349   < > = values in this interval not displayed.   Blood Culture    Component Value Date/Time   SDES BLOOD RIGHT HAND 04/12/2020 1651   SPECREQUEST AEROBIC BOTTLE ONLY Blood Culture adequate volume 04/12/2020 1651   CULT  04/12/2020 1651    NO GROWTH 5 DAYS Performed at [facility], [place] [address] [place], [place] [address]    REPTSTATUS 04/17/2020 FINAL 04/12/2020 1651    Cardiac Enzymes: No results for  input(s): CKTOTAL, CKMB, CKMBINDEX, TROPONINI in the last 168 hours. CBG: Recent Labs  Lab 04/16/20 2105 04/17/20 0729 04/17/20 1648 04/17/20 2204 04/18/20 0751  GLUCAP 161* 133* 97 123* 168*   Iron Studies: No results for input(s): IRON, TIBC, TRANSFERRIN, FERRITIN in the last 72 hours. @lablastinr3 @ Studies/Results: No results found. Medications: . sodium chloride    . sodium chloride     . sodium chloride   Intravenous Once  . allopurinol  100 mg Oral Daily  . atorvastatin  40 mg Oral q1800  . buPROPion  150 mg Oral BID  . calcitRIOL  2.25 mcg Oral Q T,Th,Sat-1800  . Chlorhexidine Gluconate Cloth  6 each Topical Q0600  . cinacalcet  30 mg Oral Q T,Th,Sat-1800  . gabapentin  300 mg Oral QHS  . insulin aspart  0-5 Units Subcutaneous QHS  . insulin aspart  0-9 Units Subcutaneous TID WC  . insulin aspart  2 Units Subcutaneous TID WC  . latanoprost  1 drop Both Eyes QHS  . linaclotide  72 mcg Oral QAC breakfast  . loratadine  10 mg Oral Daily  . metoprolol tartrate  25 mg  Oral BID  . pantoprazole  40 mg Oral Daily  . polyethylene glycol  17 g Oral BID  . senna-docusate  1 tablet Oral BID  . sodium chloride irrigation  1,000 mL Irrigation Once  . sucroferric oxyhydroxide  1,000 mg Oral TID WC     Dialysis Orders: [facility] TTS  4h71min 400/800 118kg 2K/2.5CaRIJTDC No heparin - Venofer 100mg  IVq HD until 4/17 - Mircera 246mcg IV q 2 weeks(last 4/1) - Sensipar 30mg  PO TIW - Calcitriol 2.4mcg PO TIW  Assessment/Plan: 1. Recurrent GI bleeding:Hx AVMs. 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 She remains high risk for recurrent GIB D/T refusing treatment.  2. ABLA + anemia of ESRD: Hgb 6.8 on admit -> s/p 2U PRBCs, then down again with another 2U given 4/16.Aranesp 200 mcg IVgiven 4/16.HGB 8.2 today. 3. L foot osteomyelitis: ID following- s/p1st toe transmetarsal amputation4/14. Per VVS note, 50% chance to  heal. Blood Cx negative.Off ABX. 4. ESRD:HD today off schedule. Had HD off schedule 04/19 short HD ordered for today to get back on schedule.  5. Hypertension/volume - BP low/stable.HD 04/20 Net UF 1.5 now close to OP EDW. Volume status improved. UF as tolerated tomorrow.  6. Metabolic bone disease -Continue binders/Sensipar/Calcitriol for now 7. Nutrition -Renal diet/prot supp 8. R foot ulcers: VVS planning for RLE angiogram in future.Needs wound care.  Disposition- awaiting to be stable for discharge to SNF  Derrik Mceachern H. Arali Somera NP-C 04/18/2020, 12:16 PM  [employer] [phone]

## 2020-04-18 NOTE — Plan of Care (Signed)
  Problem: Education: Goal: Knowledge of disease and its progression will improve Outcome: Progressing   Problem: Activity: Goal: Risk for activity intolerance will decrease Outcome: Progressing  Patient with difficulty changing position in bed, NWB status reinforced.

## 2020-04-18 NOTE — Progress Notes (Signed)
Progress Note    04/18/2020 10:06 AM 7 Days Post-Op  Subjective: Complains of bilateral foot pain when dressings are being changed   Vitals:   04/17/20 2207 04/18/20 0435  BP: (!) 124/50 (!) 113/58  Pulse: 70 72  Resp: 18 18  Temp: 99.3 F (37.4 C) 99.1 F (37.3 C)  SpO2: 99% 96%    Physical Exam:  Lungs: Nonlabored Extremities: Right foot bandages removed.  Mild chronic ischemic changes of the toes noted.  Webspaces are macerated Left foot dressing removed.  No granulation tissue. Both feet are warm       CBC    Component Value Date/Time   WBC 9.2 04/18/2020 0244   RBC 3.13 (L) 04/18/2020 0244   HGB 8.2 (L) 04/18/2020 0244   HCT 27.1 (L) 04/18/2020 0244   HCT 22.0 (L) 10/06/2019 0021   PLT 349 04/18/2020 0244   MCV 86.6 04/18/2020 0244   MCH 26.2 04/18/2020 0244   MCHC 30.3 04/18/2020 0244   RDW 19.1 (H) 04/18/2020 0244   LYMPHSABS 1.0 04/09/2020 0744   MONOABS 1.3 (H) 04/09/2020 0744   EOSABS 0.1 04/09/2020 0744   BASOSABS 0.1 04/09/2020 0744    BMET    Component Value Date/Time   NA 136 04/17/2020 1220   K 4.4 04/17/2020 1220   CL 99 04/17/2020 1220   CO2 22 04/17/2020 1220   GLUCOSE 175 (H) 04/17/2020 1220   BUN 50 (H) 04/17/2020 1220   CREATININE 6.88 (H) 04/17/2020 1220   CREATININE 2.62 (H) 04/29/2016 1629   CALCIUM 8.5 (L) 04/17/2020 1220   GFRNONAA 6 (L) 04/17/2020 1220   GFRAA 6 (L) 04/17/2020 1220     Intake/Output Summary (Last 24 hours) at 04/18/2020 1006 Last data filed at 04/17/2020 1822 Gross per 24 hour  Intake 120 ml  Output 1551 ml  Net -1431 ml    HOSPITAL MEDICATIONS Scheduled Meds: . sodium chloride   Intravenous Once  . allopurinol  100 mg Oral Daily  . atorvastatin  40 mg Oral q1800  . buPROPion  150 mg Oral BID  . calcitRIOL  2.25 mcg Oral Q T,Th,Sat-1800  . Chlorhexidine Gluconate Cloth  6 each Topical Q0600  . cinacalcet  30 mg Oral Q T,Th,Sat-1800  . gabapentin  300 mg Oral QHS  . insulin aspart  0-5  Units Subcutaneous QHS  . insulin aspart  0-9 Units Subcutaneous TID WC  . insulin aspart  2 Units Subcutaneous TID WC  . latanoprost  1 drop Both Eyes QHS  . linaclotide  72 mcg Oral QAC breakfast  . loratadine  10 mg Oral Daily  . metoprolol tartrate  25 mg Oral BID  . pantoprazole  40 mg Oral Daily  . polyethylene glycol  17 g Oral BID  . senna-docusate  1 tablet Oral BID  . sodium chloride irrigation  1,000 mL Irrigation Once  . sucroferric oxyhydroxide  1,000 mg Oral TID WC   Continuous Infusions: . sodium chloride    . sodium chloride     PRN Meds:.sodium chloride, sodium chloride, acetaminophen, albuterol, alteplase, heparin, lidocaine (PF), lidocaine-prilocaine, mometasone-formoterol, nitroGLYCERIN, oxyCODONE, pentafluoroprop-tetrafluoroeth  Assessment:  69 y.o. female is s/p: Amputation left great toe on 2/9.  On 2/8 she underwent jetstream atherectomy of the left superficial femoral artery and popliteal artery with stenting of the left popliteal artery and drug-coated balloon angioplasty of the left superficial femoral artery by Dr. Donzetta Matters  No evidence of wound healing at the left great toe amputation site. 7 Days  Post-Op  Plan: -Continue local wound care.  Will discuss clinical findings with Dr. Elige Ko, PA-C [facility] [phone] 04/18/2020  10:06 AM

## 2020-04-18 NOTE — TOC Progression Note (Addendum)
Transition of Care [facility]) - Progression Note    Patient Details  Name: [name] [name] MRN: [id number] Date of Birth: 12-Nov-1951  Transition [facility] CM/SW [place], [place] Phone Number: 04/18/2020, 12:16 PM  Clinical Narrative:    4:51pm- CSW spoke with [employer] with [facility] Medicare. Pt clinicals faxed to [phone].   12:16pm- CSW met with pt at bedside. Reintroduced self, role, reason for visit. Pt plan remains [place]. Auth still pending. CSW touched base with Freda Munro who confirmed auth still pending. CSW noted last COVID was 4/19; if pt does not d/c today will need another screen, will request from MD before shift end if still not approval.    Expected Discharge Plan: Skilled Nursing Facility Barriers to Discharge: Continued Medical Work up, Ship broker  Expected Discharge Plan and Services Expected Discharge Plan: [place] In-house Referral: Clinical Social Work Discharge Planning Services: CM Consult Post Acute Care Choice: Resumption of Product/process development scientist, [place] arrangements for the past 2 months: Single Family Home Expected Discharge Date: 04/04/20      Readmission Risk Interventions Readmission Risk Prevention Plan 04/05/2020 12/12/2019 10/08/2019  Transportation Screening Complete Complete -  Medication Review Press photographer) Complete Complete -  PCP or Specialist appointment within 3-5 days of discharge Not Complete Complete -  PCP/Specialist Appt Not Complete comments facility resident - -  [place] or Home Care Consult Complete - -  SW Recovery Care/Counseling Consult Complete - -  Palliative Care Screening Not Applicable - Not Applicable  Skilled Nursing Facility Complete - -  Some recent data might be hidden

## 2020-04-18 NOTE — Progress Notes (Signed)
Hypoglycemic Event  CBG: 60  Treatment: 8 oz juice/soda  Symptoms: None  Follow-up CBG: Time:1843  CBG Result: 70  Possible Reasons for Event: Other: Patient initally wanted to sleep instead of eating dinner. Staff assisted patient to edge of bed and set up tray.  Comments/MD notified: Basal dose of insulin held.    Joni Reining

## 2020-04-18 NOTE — Progress Notes (Signed)
[facility] Progress Note  Patient: [name] [name]    VOJ:[id number]  DOA: 04/03/2020     Date of Service: the patient was seen and examined on 04/18/2020  Chief Complaint  Patient presents with  . Abnormal Lab   Brief hospital course: Past medical history of ESRD on HD, CAD S/P CABG, combined CHF, type II DM, CVA.  Recurrent anemia.  Patient presented with complaints of abnormal hemoglobin from hemodialysis.  She was transfused PRBC. After that she started having fever and tachycardia.  Patient was transferred to [facility] for hemodialysis need and continuous observation for her sepsis-like picture.  Patient continues to have low hemoglobin and therefore GI was consulted.  Patient has refused bowel prep x2 so far. GI has signed off. 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  Also found to have foot osteomyelitis needing surgical resection. 4/14, patient underwent left great toe amputation and revision. Blood cultures negative. 4/15, temperature overnight.  Extremely tender right foot with swelling. CT scan with diffuse cellulitis, no localized collection and no evidence of osteomyelitis.  Continue antibiotics. 4/16, hemoglobin less than 7.  No active bleeding.  2 units transfused with appropriate response.  Assessment and Plan: 1.  Acute upper GI bleeding: S/P EGD, duodenal AVM treated with APC Primary reason for patient's presentation.  Patient reported hematochezia. 04/03/2020, 2 units PRBC hemoglobin responded. 04/13/2020, hemoglobin less than 7, 2 units of PRBC with appropriate response. Patient has refused colonoscopy prep x2. Not a candidate for capsule endoscopy given no bowel prep. Remains at risk for further GI bleed. Prior colonoscopy last February was suggestive of an AVM treated with clips. On PPI daily.  2.  ESRD on HD Admitted to [facility] for ESRD. Continue HD appreciate nephrology input.  Getting dialysis on her schedule.  3.  History of  combined chronic CHF Currently euvolemic.  Monitor.  4.  Type 2 diabetes mellitus with renal complication.  Uncontrolled with hyperglycemia Currently on sliding scale insulin.  Well-controlled.  5.  Essential hypertension. Blood pressure stable.  Monitor.  6.  Osteomyelitis left foot status post left great toe amputation and revision.  Dry gangrene right foot with surrounding cellulitis: Bilateral peripheral vascular disease. Left foot status post surgical resection and open wound followed by surgery, looks with poor base.  Hoping for recovery.  On local wound care now.  Right foot with dry gangrene's of the toes, swelling and inflammation, no evidence of osteomyelitis.  Vascular surgery planning for right lower extremity angiogram and appropriate level of surgical treatment.  She finished antibiotic therapy. Left open wound, twice daily Dakin's dressing as suggested. Right foot, follow-up with Dr. Donzetta Matters in 2 weeks. Overall both feet looks like they have very poor chance of recovery.  7. Morbid obesity. Body mass index is 39.7 kg/m.   Diet: Renal diet DVT Prophylaxis: SCD, pharmacological prophylaxis contraindicated due to Due to GI bleed   Advance goals of care discussion: Full code  Family Communication: Patient talking to family.  Status is: Inpatient  Remains inpatient appropriate because: Stable to go to skilled nursing.   Dispo: The patient is from: Home              Anticipated d/c is to: SNF              Anticipated d/c date is: When bed available.              Patient currently stable.    Subjective: Patient was seen and  examined.  No new events. Physical Exam: Physical Exam  Constitutional:  Not in any distress.  Chronically sick looking.  On room air.  HENT:  Head: Normocephalic.  Cardiovascular: Normal rate and regular rhythm.  Respiratory:  Right chest wall permacath present.  Musculoskeletal:     Cervical back: Neck supple.     Comments: Both feet on  dressing.  Not removed by me today.  Images available in chart.  Tender to touch.  Vitals:   04/17/20 1459 04/17/20 1630 04/17/20 2207 04/18/20 0435  BP: 112/61 119/70 (!) 124/50 (!) 113/58  Pulse: 66 74 70 72  Resp: 19 19 18 18   Temp: 98 F (36.7 C) 98.7 F (37.1 C) 99.3 F (37.4 C) 99.1 F (37.3 C)  TempSrc: Oral Oral Oral Oral  SpO2: 99% 100% 99% 96%  Weight: 118.4 kg     Height:        Intake/Output Summary (Last 24 hours) at 04/18/2020 1348 Last data filed at 04/17/2020 1287 Gross per 24 hour  Intake 120 ml  Output 1551 ml  Net -1431 ml   Filed Weights   04/16/20 1103 04/17/20 1205 04/17/20 1459  Weight: 118.4 kg 120 kg 118.4 kg    Data Reviewed: I have personally reviewed and interpreted daily labs, tele strips, imagings as discussed above. I reviewed all nursing notes, pharmacy notes, vitals, pertinent old records I have discussed plan of care as described above with RN and patient/family.  CBC: Recent Labs  Lab 04/14/20 0549 04/16/20 0236 04/17/20 0348 04/17/20 1220 04/18/20 0244  WBC 16.2* 10.0 10.1 10.4 9.2  HGB 8.8* 8.0* 9.0* 8.7* 8.2*  HCT 27.3* 25.3* 29.3* 28.5* 27.1*  MCV 85.8 85.8 88.5 88.5 86.6  PLT 403* 437* 442* 434* 867   Basic Metabolic Panel: Recent Labs  Lab 04/12/20 0410 04/13/20 0209 04/14/20 0549 04/16/20 0236 04/17/20 1220  NA 137 136 136 135 136  K 4.8 4.9 4.0 5.0 4.4  CL 96* 97* 97* 95* 99  CO2 24 24 25 24 22   GLUCOSE 101* 144* 131* 129* 175*  BUN 39* 49* 34* 52* 50*  CREATININE 6.62* 7.66* 5.70* 8.49* 6.88*  CALCIUM 8.7* 8.0* 8.4* 8.5* 8.5*  PHOS  --   --   --  4.6 4.0    Studies: No results found.  Scheduled Meds: . sodium chloride   Intravenous Once  . allopurinol  100 mg Oral Daily  . atorvastatin  40 mg Oral q1800  . buPROPion  150 mg Oral BID  . calcitRIOL  2.25 mcg Oral Q T,Th,Sat-1800  . Chlorhexidine Gluconate Cloth  6 each Topical Q0600  . cinacalcet  30 mg Oral Q T,Th,Sat-1800  . [START ON 04/21/2020]  darbepoetin (ARANESP) injection - DIALYSIS  200 mcg Intravenous Q Sat-HD  . gabapentin  300 mg Oral QHS  . insulin aspart  0-5 Units Subcutaneous QHS  . insulin aspart  0-9 Units Subcutaneous TID WC  . insulin aspart  2 Units Subcutaneous TID WC  . latanoprost  1 drop Both Eyes QHS  . linaclotide  72 mcg Oral QAC breakfast  . loratadine  10 mg Oral Daily  . metoprolol tartrate  25 mg Oral BID  . pantoprazole  40 mg Oral Daily  . polyethylene glycol  17 g Oral BID  . senna-docusate  1 tablet Oral BID  . sodium chloride irrigation  1,000 mL Irrigation Once  . sucroferric oxyhydroxide  1,000 mg Oral TID WC   Continuous Infusions: . sodium chloride    .  sodium chloride     PRN Meds: sodium chloride, sodium chloride, acetaminophen, albuterol, alteplase, heparin, lidocaine (PF), lidocaine-prilocaine, mometasone-formoterol, nitroGLYCERIN, oxyCODONE, pentafluoroprop-tetrafluoroeth  Time spent: 25 minutes

## 2020-04-19 LAB — RENAL FUNCTION PANEL
Albumin: 2.1 g/dL — ABNORMAL LOW (ref 3.5–5.0)
Anion gap: 14 (ref 5–15)
BUN: 57 mg/dL — ABNORMAL HIGH (ref 8–23)
CO2: 24 mmol/L (ref 22–32)
Calcium: 8.3 mg/dL — ABNORMAL LOW (ref 8.9–10.3)
Chloride: 99 mmol/L (ref 98–111)
Creatinine, Ser: 7.1 mg/dL — ABNORMAL HIGH (ref 0.44–1.00)
GFR calc Af Amer: 6 mL/min — ABNORMAL LOW (ref >60)
GFR calc non Af Amer: 5 mL/min — ABNORMAL LOW (ref >60)
Glucose, Bld: 181 mg/dL — ABNORMAL HIGH (ref 70–99)
Phosphorus: 4 mg/dL (ref 2.5–4.6)
Potassium: 4.4 mmol/L (ref 3.5–5.1)
Sodium: 137 mmol/L (ref 135–145)

## 2020-04-19 LAB — GLUCOSE, CAPILLARY
Glucose-Capillary: 183 mg/dL — ABNORMAL HIGH (ref 70–99)
Glucose-Capillary: 101 mg/dL — ABNORMAL HIGH (ref 70–99)
Glucose-Capillary: 160 mg/dL — ABNORMAL HIGH (ref 70–99)

## 2020-04-19 LAB — CBC
HCT: 29.2 % — ABNORMAL LOW (ref 36.0–46.0)
Hemoglobin: 8.8 g/dL — ABNORMAL LOW (ref 12.0–15.0)
MCH: 26.4 pg (ref 26.0–34.0)
MCHC: 30.1 g/dL (ref 30.0–36.0)
MCV: 87.7 fL (ref 80.0–100.0)
Platelets: 419 10*3/uL — ABNORMAL HIGH (ref 150–400)
RBC: 3.33 MIL/uL — ABNORMAL LOW (ref 3.87–5.11)
RDW: 19.2 % — ABNORMAL HIGH (ref 11.5–15.5)
WBC: 10.4 10*3/uL (ref 4.0–10.5)
nRBC: 0.6 % — ABNORMAL HIGH (ref 0.0–0.2)

## 2020-04-19 MED ORDER — CINACALCET HCL 30 MG PO TABS: 30.0000 mg | ORAL_TABLET | ORAL | Status: AC

## 2020-04-19 MED ORDER — CALCITRIOL 0.25 MCG PO CAPS: 2.2500 ug | ORAL_CAPSULE | ORAL | Status: AC

## 2020-04-19 NOTE — Progress Notes (Signed)
   04/19/20 1200  Vitals  Temp 99.3 F (37.4 C)  Temp Source Oral  BP 137/65  MAP (mmHg) 84  BP Location Right Arm  BP Method Automatic  Patient Position (if appropriate) Lying  Pulse Rate 72  Pulse Rate Source Dinamap  Resp 18  Level of Consciousness  Level of Consciousness Alert  Oxygen Therapy  SpO2 99 %  O2 Device Room Air  MEWS Score  MEWS Temp 0  MEWS Systolic 0  MEWS Pulse 0  MEWS RR 0  MEWS LOC 0  MEWS Score 0  MEWS Score Color Green    Pt returned to 6N07 via bed from dialysis received report from [facility], RN will continue to monitor

## 2020-04-19 NOTE — Progress Notes (Addendum)
  Progress Note    04/19/2020 8:11 AM 8 Days Post-Op  Subjective:  No new complaints this morning   Vitals:   04/19/20 0730 04/19/20 0800  BP: 130/61 (!) 119/57  Pulse: 76 80  Resp:    Temp:    SpO2:     Physical Exam: Lungs:  Non labored Extremities:  Pictures captured 4/21 Abdomen:  soft Neurologic: A&O        CBC    Component Value Date/Time   WBC 10.4 04/19/2020 0208   RBC 3.33 (L) 04/19/2020 0208   HGB 8.8 (L) 04/19/2020 0208   HCT 29.2 (L) 04/19/2020 0208   HCT 22.0 (L) 10/06/2019 0021   PLT 419 (H) 04/19/2020 0208   MCV 87.7 04/19/2020 0208   MCH 26.4 04/19/2020 0208   MCHC 30.1 04/19/2020 0208   RDW 19.2 (H) 04/19/2020 0208   LYMPHSABS 1.0 04/09/2020 0744   MONOABS 1.3 (H) 04/09/2020 0744   EOSABS 0.1 04/09/2020 0744   BASOSABS 0.1 04/09/2020 0744    BMET    Component Value Date/Time   NA 137 04/19/2020 0731   K 4.4 04/19/2020 0731   CL 99 04/19/2020 0731   CO2 24 04/19/2020 0731   GLUCOSE 181 (H) 04/19/2020 0731   BUN 57 (H) 04/19/2020 0731   CREATININE 7.10 (H) 04/19/2020 0731   CREATININE 2.62 (H) 04/29/2016 1629   CALCIUM 8.3 (L) 04/19/2020 0731   GFRNONAA 5 (L) 04/19/2020 0731   GFRAA 6 (L) 04/19/2020 0731    INR    Component Value Date/Time   INR 1.3 (H) 04/09/2020 0744     Intake/Output Summary (Last 24 hours) at 04/19/2020 3846 Last data filed at 04/18/2020 1300 Gross per 24 hour  Intake 277 ml  Output --  Net 277 ml     Assessment/Plan:  69 y.o. female is s/p open L GT amputation 8 Days Post-Op   L GT amp site not showing much progress but will allow more time for demarcation R foot wounds stable Discussed with patient she is at risk for further amputation LLE; will also address RLE as an outpatient Continue wet to dry dressing changes L foot Ok for d/c to SNF today Follow up appt with Dr. Donzetta Matters on 5/7 at 14:20   Dagoberto Ligas, PA-C [facility] [phone] 04/19/2020 8:11 AM  I have seen  and evaluated the patient. I agree with the PA note as documented above.   Marty Heck, MD [facility] [place] Office: [phone]

## 2020-04-19 NOTE — Discharge Summary (Signed)
Physician Discharge Summary  [name] [name] GEX:[id number] DOB: 1951-04-08 DOA: 04/03/2020  PCP: [place] date: 04/03/2020 Discharge date: 04/19/2020  Admitted From: SNF Disposition:  SNF  Recommendations for Outpatient Follow-up:  1. Continue dialysis schedule. 2. Continue dressing changes.  Vascular surgery to call for follow-up.   Discharge Condition: Stable CODE STATUS: Full code Diet recommendation: Low-carb low-salt diet  Brief/Interim Summary: 69 year old female with extensive medical issues including ESRD on HD, CAD S/P CABG, combined CHF, type II DM, CVA.  Recurrent anemia.  Patient presented with complaints of abnormal hemoglobin from hemodialysis.  She was transfused PRBC. After that she started having fever and tachycardia.  Patient was transferred to [facility] for hemodialysis need and continuous observation for her sepsis-like picture.  Patient continued to have low hemoglobin and therefore GI was consulted.  Patient has refused bowel prep x2 so far. 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  Also found to have foot osteomyelitis needing surgical resection. 4/14, patient underwent left great toe amputation and revision. Blood cultures negative. 4/15, temperature overnight.  Extremely tender right foot with swelling. 4/16, hemoglobin less than 7.  No active bleeding.  2 units transfused with appropriate response.  Patient has extensive medical issues, her plan of care will be as below.  1.  Acute upper GI bleeding: S/P EGD, duodenal AVM treated with APC Received total 4 units of transfusion.  Also has anemia of chronic disease and her baseline hemoglobin is low.  She will need frequent monitoring and transfusion if needed.  Also getting Aranesp with dialysis.   Patient has refused colonoscopy prep x2. Not a candidate for capsule endoscopy given no bowel prep. Remains at risk for further GI bleed. Prior colonoscopy last  February was suggestive of an AVM treated with clips. On PPI daily.  2.  ESRD on HD Continue HD appreciate nephrology input.  Getting dialysis on her schedule.  3.  History of combined chronic CHF Currently euvolemic.    4.  Type 2 diabetes mellitus with renal complication.  Uncontrolled with hyperglycemia Currently on sliding scale insulin.  Well-controlled.  She is on low-dose prandial insulin as well as sliding scale insulin that she will continue.  5.  Essential hypertension. Blood pressure stable.  Monitor.  6.  Osteomyelitis left foot status post left great toe amputation and revision.  Dry gangrene right foot with surrounding cellulitis: Bilateral peripheral vascular disease. Left foot status post surgical resection and open wound followed by surgery, looks with poor base. On local wound care now.  Right foot with dry gangrene's of the toes, swelling and inflammation, no evidence of osteomyelitis.  Vascular surgery planning for right lower extremity angiogram and appropriate level of surgical treatment.  She finished antibiotic therapy. Left open wound, twice daily Dakin's dressing as suggested. Right foot, follow-up with Dr. Donzetta Matters .  They have follow-up as scheduled. Overall both feet looks like they have very poor chance of recovery.  Patient to go to skilled nursing facility to continue local wound care, vascular surgery to follow-up to determine appropriate level of possible amputation after demarcation.  7. Morbid obesity. Body mass index is 39.7 kg/m.    Discharge Diagnoses:  Principal Problem:   Osteomyelitis ([place]) Active Problems:   Glaucoma   Essential hypertension   Type 2 diabetes mellitus with chronic kidney disease on chronic dialysis, with long-term current use of insulin (HCC)   Obesity, Class III, BMI 40-49.9 (morbid obesity) (HCC)   Chronic  combined systolic and diastolic CHF (congestive heart failure) ([place])   ESRD on dialysis ([place])   Substance  abuse ([place])   Unspecified atrial fibrillation (HCC)   Symptomatic anemia   GI bleed    Discharge Instructions  Discharge Instructions    Diet - low sodium heart healthy   Complete by: As directed    Diet Carb Modified   Complete by: As directed    Discharge instructions   Complete by: As directed    Follow-up with your primary care provider at the skilled nursing facility in 3 to 5 days.  Continue hemodialysis as scheduled.   Discharge wound care:   Complete by: As directed    Dressing left foot , daily Dakin's dressing . Wet to dry.   Increase activity slowly   Complete by: As directed    Increase activity slowly   Complete by: As directed      Allergies as of 04/19/2020      Reactions   Naproxen Rash      Medication List    STOP taking these medications   insulin aspart 100 UNIT/ML injection Commonly known as: novoLOG     TAKE these medications   albuterol 108 (90 Base) MCG/ACT inhaler Commonly known as: VENTOLIN HFA Inhale 2 puffs into the lungs every 6 (six) hours as needed for wheezing or shortness of breath.   allopurinol 100 MG tablet Commonly known as: ZYLOPRIM Take 1 tablet (100 mg total) by mouth daily.   aspirin 81 MG EC tablet Take 1 tablet (81 mg total) by mouth daily.   atorvastatin 40 MG tablet Commonly known as: LIPITOR Take 1 tablet (40 mg total) by mouth daily at 6 PM.   buPROPion 150 MG 12 hr tablet Commonly known as: WELLBUTRIN SR Take 1 tablet (150 mg total) by mouth 2 (two) times daily.   calcitRIOL 0.25 MCG capsule Commonly known as: ROCALTROL Take 9 capsules (2.25 mcg total) by mouth every Tuesday, Thursday, and Saturday at 6 PM.   cetirizine 10 MG tablet Commonly known as: ZYRTEC Take 10 mg by mouth daily as needed for allergies.   cinacalcet 30 MG tablet Commonly known as: SENSIPAR Take 1 tablet (30 mg total) by mouth every Tuesday, Thursday, and Saturday at 6 PM.   ferrous sulfate 325 (65 FE) MG tablet Take 325 mg by  mouth 2 (two) times daily with a meal.   gabapentin 300 MG capsule Commonly known as: NEURONTIN Take 1 capsule (300 mg total) by mouth at bedtime.   insulin lispro 100 UNIT/ML injection Commonly known as: HUMALOG Inject 2 Units into the skin 3 (three) times daily before meals.   latanoprost 0.005 % ophthalmic solution Commonly known as: XALATAN Place 1 drop into both eyes at bedtime.   Linzess 72 MCG capsule Generic drug: linaclotide Take 72 mcg by mouth daily before breakfast.   metoprolol tartrate 25 MG tablet Commonly known as: LOPRESSOR Take 1 tablet (25 mg total) by mouth 2 (two) times daily.   mometasone-formoterol 100-5 MCG/ACT Aero Commonly known as: DULERA Inhale 2 puffs into the lungs every 4 (four) hours as needed for wheezing.   nitroGLYCERIN 0.4 MG SL tablet Commonly known as: NITROSTAT Place 1 tablet (0.4 mg total) under the tongue every 5 (five) minutes as needed for chest pain.   oxyCODONE 5 MG immediate release tablet Commonly known as: Oxy IR/ROXICODONE Take 1 tablet (5 mg total) by mouth every 6 (six) hours as needed for up to 5 days for moderate  pain.   pantoprazole 40 MG tablet Commonly known as: Protonix Take 1 tablet (40 mg total) by mouth daily.   polyethylene glycol 17 g packet Commonly known as: MiraLax Take 17 g by mouth daily as needed for moderate constipation.   Tums 500 MG chewable tablet Generic drug: calcium carbonate Chew 2 tablets by mouth at bedtime.   Velphoro 500 MG chewable tablet Generic drug: sucroferric oxyhydroxide Chew 2 tablets (1,000 mg total) by mouth 3 (three) times daily with meals.            Discharge Care Instructions  (From admission, onward)         Start     Ordered   04/19/20 0000  Discharge wound care:    Comments: Dressing left foot , daily Dakin's dressing . Wet to dry.   04/19/20 1140          Contact information for follow-up providers    Waynetta Sandy, MD Follow up today.     Specialties: Vascular Surgery, Cardiology Why: 1-2 weeks. Office will contact patient to schedule appointment Contact information: [place] [address] [phone]            Contact information for after-discharge care    Destination    [facility] .   Service: Skilled Nursing Contact information: [place] [address] [phone]                 Allergies  Allergen Reactions  . Naproxen Rash    Consultations:  Nephrology  Vascular surgery   Procedures/Studies: CT FOOT RIGHT WO CONTRAST  Result Date: 04/12/2020 CLINICAL DATA:  Pain and swelling. EXAM: CT OF THE RIGHT FOOT WITHOUT CONTRAST TECHNIQUE: Multidetector CT imaging of the right foot was performed according to the standard protocol. Multiplanar CT image reconstructions were also generated. COMPARISON:  Radiographs 04/09/2020 FINDINGS: Diffuse subcutaneous soft tissue swelling/edema/fluid involving the entire ankle and foot. No focal fluid collection to suggest a drainable soft tissue abscess. Marked fatty atrophy of the foot and ankle musculature. Suspect myofasciitis involving the plantar foot musculature but no definite findings for pyomyositis. Suspect an open wound along the plantar aspect of the great toe but I do not see any obvious destructive bony changes to suggest osteomyelitis. No definite findings for septic arthritis. The other bony structures of the foot and ankle appear intact. No obvious destructive bony changes, fractures or osteochondral lesions. Extensive small artery calcifications. IMPRESSION: 1. Diffuse subcutaneous soft tissue swelling/edema/fluid involving the entire ankle and foot consistent with cellulitis. No focal fluid collection to suggest a drainable soft tissue abscess. 2. Suspect myofasciitis involving the plantar foot musculature but no definite findings for pyomyositis. Severe diffuse fatty atrophy of the foot and ankle  musculature. 3. Suspect an open wound along the plantar aspect of the great toe but I do not see any obvious destructive bony changes to suggest osteomyelitis. No definite findings for septic arthritis. 4. If symptoms persist or worsen MRI is suggested for further evaluation. Electronically Signed   By: Marijo Sanes M.D.   On: 04/12/2020 10:51   DG CHEST PORT 1 VIEW  Result Date: 04/04/2020 CLINICAL DATA:  69 year old female with fever. EXAM: PORTABLE CHEST 1 VIEW COMPARISON:  Chest radiograph dated 02/24/2020. FINDINGS: Right-sided dialysis catheter in similar position. There is cardiomegaly with mild vascular congestion. No focal consolidation, pleural effusion or pneumothorax. Median sternotomy wires and CABG vascular clips. No acute osseous pathology. IMPRESSION: Cardiomegaly with mild vascular congestion.  No focal consolidation. Electronically Signed   By: Anner Crete M.D.   On: 04/04/2020 17:29   DG ABD ACUTE 2+V W 1V CHEST  Result Date: 04/09/2020 CLINICAL DATA:  Fever with low hemoglobin. EXAM: DG ABDOMEN ACUTE W/ 1V CHEST COMPARISON:  Abdominal film 06/26/2018 and chest x-ray 02/24/2020 FINDINGS: Right IJ central venous catheter unchanged with tip over the SVC. Lungs are adequately inflated with minimal hazy prominence of the perihilar vessels unchanged and likely due to mild chronic vascular congestion. No lobar consolidation or effusion. Mild stable cardiomegaly. Remainder the chest is unchanged. There are several air-filled large and small bowel loops. A few air-filled small bowel loops at the upper limits of normal in diameter over the left abdomen. No evidence of air-fluid levels or free peritoneal air. Remainder of the exam is unchanged. IMPRESSION: Nonspecific, nonobstructive bowel gas pattern with a few air-filled mildly prominent, but nondilated small bowel loops in the left abdomen. Stable mild cardiomegaly with suggestion of minimal chronic vascular congestion. Electronically Signed    By: Marin Olp M.D.   On: 04/09/2020 09:57   DG Foot Complete Left  Result Date: 04/09/2020 CLINICAL DATA:  Foot pain and swelling EXAM: LEFT FOOT - COMPLETE 3+ VIEW COMPARISON:  02/01/2020 MRI, 01/31/2020 plain film FINDINGS: There is been interval amputation of the first toe. Persistent soft tissue wound is seen. There is lucency identified in the head of the first metatarsal suspicious for recurrent osteomyelitis. No other fracture or dislocation is seen. No other soft tissue abnormality is noted. IMPRESSION: Changes suspicious for osteomyelitis in the head of the first metatarsal. Overlying soft tissue wound is noted. Electronically Signed   By: Inez Catalina M.D.   On: 04/09/2020 19:40   VAS [place] LOWER EXTREMITY VENOUS (DVT)  Result Date: 04/10/2020  Lower Venous DVTStudy Indications: Edema, and fever.  Limitations: Body habitus and poor ultrasound/tissue interface. Comparison Study: 04/28/19 previous Performing Technologist: Abram Sander RVS  Examination Guidelines: A complete evaluation includes B-mode imaging, spectral Doppler, color Doppler, and power Doppler as needed of all accessible portions of each vessel. Bilateral testing is considered an integral part of a complete examination. Limited examinations for reoccurring indications may be performed as noted. The reflux portion of the exam is performed with the patient in reverse Trendelenburg.  +---------+---------------+---------+-----------+----------+--------------+ RIGHT    CompressibilityPhasicitySpontaneityPropertiesThrombus Aging +---------+---------------+---------+-----------+----------+--------------+ CFV      Full           Yes      Yes                                 +---------+---------------+---------+-----------+----------+--------------+ SFJ      Full                                                        +---------+---------------+---------+-----------+----------+--------------+ FV Prox  Full                                                         +---------+---------------+---------+-----------+----------+--------------+ FV Mid   Full                                                        +---------+---------------+---------+-----------+----------+--------------+  FV DistalFull                                                        +---------+---------------+---------+-----------+----------+--------------+ PFV      Full                                                        +---------+---------------+---------+-----------+----------+--------------+ POP      Full           Yes      Yes                                 +---------+---------------+---------+-----------+----------+--------------+ PTV      Full                                                        +---------+---------------+---------+-----------+----------+--------------+ PERO                                                  Not visualized +---------+---------------+---------+-----------+----------+--------------+   +---------+---------------+---------+-----------+----------+--------------+ LEFT     CompressibilityPhasicitySpontaneityPropertiesThrombus Aging +---------+---------------+---------+-----------+----------+--------------+ CFV      Full           Yes      Yes                                 +---------+---------------+---------+-----------+----------+--------------+ SFJ      Full                                                        +---------+---------------+---------+-----------+----------+--------------+ FV Prox  Full                                                        +---------+---------------+---------+-----------+----------+--------------+ FV Mid   Full                                                        +---------+---------------+---------+-----------+----------+--------------+ FV DistalFull                                                         +---------+---------------+---------+-----------+----------+--------------+  PFV      Full                                                        +---------+---------------+---------+-----------+----------+--------------+ POP      Full           Yes      Yes                                 +---------+---------------+---------+-----------+----------+--------------+ PTV      Full                                                        +---------+---------------+---------+-----------+----------+--------------+ PERO                                                  Not visualized +---------+---------------+---------+-----------+----------+--------------+     Summary: BILATERAL: - No evidence of deep vein thrombosis seen in the lower extremities, bilaterally.   *See table(s) above for measurements and observations. Electronically signed by Deitra Mayo MD on 04/10/2020 at 3:24:49 PM.    Final      Subjective: Patient seen and examined.  No overnight events.  Getting hemodialysis.   Discharge Exam: Vitals:   04/19/20 1030 04/19/20 1100  BP: (!) 106/59 (!) 141/57  Pulse: 75 81  Resp:    Temp:    SpO2:     Vitals:   04/19/20 0930 04/19/20 1000 04/19/20 1030 04/19/20 1100  BP: (!) 153/62 124/63 (!) 106/59 (!) 141/57  Pulse: 78 79 75 81  Resp:      Temp:      TempSrc:      SpO2:      Weight:      Height:        General: Pt is alert, awake, not in acute distress, getting hemodialysis.  Chronically sick looking.  On room air.  Without any distress. Cardiovascular: RRR, S1/S2 +, no rubs, no gallops Respiratory: CTA bilaterally, no wheezing, no rhonchi Abdominal: Soft, NT, ND, bowel sounds +, obese and pendulous.   Extremities:  Bilateral lower extremity abnormal.  Left great toe amputation wound with sloughing and green base as in pictures. Right toes with gangrene with poor circulation, no active infection.          The results of significant  diagnostics from this hospitalization (including imaging, microbiology, ancillary and laboratory) are listed below for reference.     Microbiology: Recent Results (from the past 240 hour(s))  Surgical pcr screen     Status: None   Collection Time: 04/10/20  6:10 PM   Specimen: Nasal Mucosa; Nasal Swab  Result Value Ref Range Status   MRSA, PCR NEGATIVE NEGATIVE Final   Staphylococcus aureus NEGATIVE NEGATIVE Final    Comment: (NOTE) The Xpert SA Assay (FDA approved for NASAL specimens in patients 44 years of age and older), is one component of a comprehensive surveillance program. It is not intended to diagnose infection nor  to guide or monitor treatment. Performed at [facility], [place] [address] [place], [place] [address]   Culture, blood (routine x 2)     Status: None   Collection Time: 04/12/20  4:45 PM   Specimen: BLOOD  Result Value Ref Range Status   Specimen Description BLOOD RIGHT ANTECUBITAL  Final   Special Requests   Final    BOTTLES DRAWN AEROBIC AND ANAEROBIC Blood Culture adequate volume   Culture   Final    NO GROWTH 5 DAYS Performed at [facility], [place] [address], [place], [place] [address]    Report Status 04/17/2020 FINAL  Final  Culture, blood (routine x 2)     Status: None   Collection Time: 04/12/20  4:51 PM   Specimen: BLOOD RIGHT HAND  Result Value Ref Range Status   Specimen Description BLOOD RIGHT HAND  Final   Special Requests AEROBIC BOTTLE ONLY Blood Culture adequate volume  Final   Culture   Final    NO GROWTH 5 DAYS Performed at [facility], [place] [address] [place], [place] [address]    Report Status 04/17/2020 FINAL  Final  SARS CORONAVIRUS 2 (TAT 6-24 HRS) Nasopharyngeal Nasopharyngeal Swab     Status: None   Collection Time: 04/16/20  3:23 PM   Specimen: Nasopharyngeal Swab  Result Value Ref Range Status   SARS Coronavirus 2 NEGATIVE NEGATIVE Final    Comment: (NOTE) SARS-CoV-2 target nucleic acids are NOT  DETECTED. The SARS-CoV-2 RNA is generally detectable in upper and lower respiratory specimens during the acute phase of infection. Negative results do not preclude SARS-CoV-2 infection, do not rule out co-infections with other pathogens, and should not be used as the sole basis for treatment or other patient management decisions. Negative results must be combined with clinical observations, patient history, and epidemiological information. The expected result is Negative. Fact Sheet for Patients: SugarRoll.be Fact Sheet for Healthcare Providers: https://www.woods-mathews.com/ This test is not yet approved or cleared by the [place] FDA and  has been authorized for detection and/or diagnosis of SARS-CoV-2 by FDA under an Emergency Use Authorization (EUA). This EUA will remain  in effect (meaning this test can be used) for the duration of the COVID-19 declaration under Section 56 4(b)(1) of the Act, 21 U.S.C. section 360bbb-3(b)(1), unless the authorization is terminated or revoked sooner. Performed at [facility], [place] [address] [place],  [address]      Labs: BNP (last 3 results) Recent Labs    06/01/19 2322 08/16/19 0934 01/01/20 0600  BNP 331.8* 250.5* 315.1*   Basic Metabolic Panel: Recent Labs  Lab 04/13/20 0209 04/14/20 0549 04/16/20 0236 04/17/20 1220 04/19/20 0731  NA 136 136 135 136 137  K 4.9 4.0 5.0 4.4 4.4  CL 97* 97* 95* 99 99  CO2 24 25 24 22 24   GLUCOSE 144* 131* 129* 175* 181*  BUN 49* 34* 52* 50* 57*  CREATININE 7.66* 5.70* 8.49* 6.88* 7.10*  CALCIUM 8.0* 8.4* 8.5* 8.5* 8.3*  PHOS  --   --  4.6 4.0 4.0   Liver Function Tests: Recent Labs  Lab 04/16/20 0236 04/17/20 1220 04/19/20 0731  ALBUMIN 2.0* 2.1* 2.1*   No results for input(s): LIPASE, AMYLASE in the last 168 hours. No results for input(s): AMMONIA in the last 168 hours. CBC: Recent Labs  Lab 04/16/20 0236 04/17/20 0348  04/17/20 1220 04/18/20 0244 04/19/20 0208  WBC 10.0 10.1 10.4 9.2 10.4  HGB 8.0*  9.0* 8.7* 8.2* 8.8*  HCT 25.3* 29.3* 28.5* 27.1* 29.2*  MCV 85.8 88.5 88.5 86.6 87.7  PLT 437* 442* 434* 349 419*   Cardiac Enzymes: No results for input(s): CKTOTAL, CKMB, CKMBINDEX, TROPONINI in the last 168 hours. BNP: Invalid input(s): POCBNP CBG: Recent Labs  Lab 04/18/20 1226 04/18/20 1803 04/18/20 1843 04/18/20 2010 04/19/20 0520  GLUCAP 266* 60* 70 144* 183*   D-Dimer No results for input(s): DDIMER in the last 72 hours. Hgb A1c No results for input(s): HGBA1C in the last 72 hours. Lipid Profile No results for input(s): CHOL, HDL, LDLCALC, TRIG, CHOLHDL, LDLDIRECT in the last 72 hours. Thyroid function studies No results for input(s): TSH, T4TOTAL, T3FREE, THYROIDAB in the last 72 hours.  Invalid input(s): FREET3 Anemia work up No results for input(s): VITAMINB12, FOLATE, FERRITIN, TIBC, IRON, RETICCTPCT in the last 72 hours. Urinalysis    Component Value Date/Time   COLORURINE YELLOW 08/19/2019 0712   APPEARANCEUR HAZY (A) 08/19/2019 0712   LABSPEC 1.012 08/19/2019 0712   PHURINE 5.0 08/19/2019 0712   GLUCOSEU NEGATIVE 08/19/2019 0712   HGBUR NEGATIVE 08/19/2019 0712   BILIRUBINUR NEGATIVE 08/19/2019 0712   KETONESUR NEGATIVE 08/19/2019 0712   PROTEINUR NEGATIVE 08/19/2019 0712   UROBILINOGEN 0.2 05/12/2015 0938   NITRITE NEGATIVE 08/19/2019 0712   LEUKOCYTESUR SMALL (A) 08/19/2019 0712   Sepsis Labs Invalid input(s): PROCALCITONIN,  WBC,  LACTICIDVEN Microbiology Recent Results (from the past 240 hour(s))  Surgical pcr screen     Status: None   Collection Time: 04/10/20  6:10 PM   Specimen: Nasal Mucosa; Nasal Swab  Result Value Ref Range Status   MRSA, PCR NEGATIVE NEGATIVE Final   Staphylococcus aureus NEGATIVE NEGATIVE Final    Comment: (NOTE) The Xpert SA Assay (FDA approved for NASAL specimens in patients 70 years of age and older), is one component of a  comprehensive surveillance program. It is not intended to diagnose infection nor to guide or monitor treatment. Performed at [facility], [place] [address] [place], [place] [address]   Culture, blood (routine x 2)     Status: None   Collection Time: 04/12/20  4:45 PM   Specimen: BLOOD  Result Value Ref Range Status   Specimen Description BLOOD RIGHT ANTECUBITAL  Final   Special Requests   Final    BOTTLES DRAWN AEROBIC AND ANAEROBIC Blood Culture adequate volume   Culture   Final    NO GROWTH 5 DAYS Performed at [facility], [place] [address], [place], [place] [address]    Report Status 04/17/2020 FINAL  Final  Culture, blood (routine x 2)     Status: None   Collection Time: 04/12/20  4:51 PM   Specimen: BLOOD RIGHT HAND  Result Value Ref Range Status   Specimen Description BLOOD RIGHT HAND  Final   Special Requests AEROBIC BOTTLE ONLY Blood Culture adequate volume  Final   Culture   Final    NO GROWTH 5 DAYS Performed at [facility], [place] [address], [place], [place] [address]    Report Status 04/17/2020 FINAL  Final  SARS CORONAVIRUS 2 (TAT 6-24 HRS) Nasopharyngeal Nasopharyngeal Swab     Status: None   Collection Time: 04/16/20  3:23 PM   Specimen: Nasopharyngeal Swab  Result Value Ref Range Status   SARS Coronavirus 2 NEGATIVE NEGATIVE Final    Comment: (NOTE) SARS-CoV-2 target nucleic acids are NOT DETECTED. The SARS-CoV-2 RNA is generally detectable in upper and lower respiratory specimens  during the acute phase of infection. Negative results do not preclude SARS-CoV-2 infection, do not rule out co-infections with other pathogens, and should not be used as the sole basis for treatment or other patient management decisions. Negative results must be combined with clinical observations, patient history, and epidemiological information. The expected result is Negative. Fact Sheet for Patients: SugarRoll.be Fact Sheet for  Healthcare Providers: https://www.woods-mathews.com/ This test is not yet approved or cleared by the [place] FDA and  has been authorized for detection and/or diagnosis of SARS-CoV-2 by FDA under an Emergency Use Authorization (EUA). This EUA will remain  in effect (meaning this test can be used) for the duration of the COVID-19 declaration under Section 56 4(b)(1) of the Act, 21 U.S.C. section 360bbb-3(b)(1), unless the authorization is terminated or revoked sooner. Performed at [facility], [place] [address] [place], [place] [address]      Time coordinating discharge:  35 minutes  SIGNED:   Barb Merino, MD  [facility] 04/19/2020, 11:41 AM

## 2020-04-19 NOTE — Progress Notes (Signed)
Checked blood sugar prior having hemodialysis this AM, CBG= 183, notified NP on call, will continue to monitor patient with remainder of shift.

## 2020-04-19 NOTE — TOC Transition Note (Signed)
Transition of Care [facility]) - CM/SW Discharge Note   Patient Details  Name: [name] [name] MRN: [id number] Date of Birth: Aug 26, 1951  Transition of Care [facility]) CM/SW Contact:  Alexander Mt, LCSW Phone Number: 04/19/2020, 3:35 PM   Clinical Narrative:    No new COVID needed per admissions liaison Freda Munro, # left for report for RN.   Pt aware, excited for d/c, PTAR called for 4:30pm. She will call her sister and let her know d/c today. RN Ny'che and Kristi aware.    Final next level of care: [place] Barriers to Discharge: Barriers Resolved   Patient Goals and CMS Choice Patient states their goals for this hospitalization and ongoing recovery are:: return to [facility] for more therapy then get home CMS Medicare.gov Compare Post Acute Care list provided to:: Patient Choice offered to / list presented to : Patient  Discharge Placement PASRR number recieved: 04/05/20            Patient chooses bed at: [facility] Patient to be transferred to facility by: [place] Name of family member notified: pt will call family Patient and family notified of of transfer: 04/19/20  Discharge Plan and Services In-house Referral: Clinical Social Work Discharge Planning Services: CM Consult Post Acute Care Choice: Resumption of Svcs/PTA Provider, [place]   Readmission Risk Interventions Readmission Risk Prevention Plan 04/05/2020 12/12/2019 10/08/2019  Transportation Screening Complete Complete -  Medication Review [employer]) Complete Complete -  PCP or Specialist appointment within 3-5 days of discharge Not Complete Complete -  PCP/Specialist Appt Not Complete comments facility resident - -  [place] or Home Care Consult Complete - -  SW Recovery Care/Counseling Consult Complete - -  Palliative Care Screening Not Applicable - Not Applicable  Skilled Nursing Facility Complete - -  Some recent data might be hidden

## 2020-04-19 NOTE — TOC Progression Note (Signed)
Transition of Care [facility]) - Progression Note    Patient Details  Name: [name] [name] MRN: [id number] Date of Birth: 1951-03-28  Transition of Care [facility]) CM/SW [place], [place] Phone Number: 04/19/2020, 10:15 AM  Clinical Narrative:    Pt auth received from [facility] Medicare- SNF Level 2; 4/21-04/21/2021; VWAQ#[id number]; updated clinicals to be sent by SNF on 4/26.   CSW has reached out to SNF regarding need for new COVID screen as last one was 3 days ago. Await response. Pt currently in dialysis.    Expected Discharge Plan: [place] Barriers to Discharge: Barriers Resolved  Expected Discharge Plan and Services Expected Discharge Plan: [place] In-house Referral: Clinical Social Work Discharge Planning Services: CM Consult Post Acute Care Choice: Resumption of Svcs/PTA Provider, [place] arrangements for the past 2 months: Single Family Home Expected Discharge Date: 04/04/20               Readmission Risk Interventions Readmission Risk Prevention Plan 04/05/2020 12/12/2019 10/08/2019  Transportation Screening Complete Complete -  Medication Review Press photographer) Complete Complete -  PCP or Specialist appointment within 3-5 days of discharge Not Complete Complete -  PCP/Specialist Appt Not Complete comments facility resident - -  [place] or Home Care Consult Complete - -  SW Recovery Care/Counseling Consult Complete - -  Palliative Care Screening Not Applicable - Not Applicable  Skilled Nursing Facility Complete - -  Some recent data might be hidden

## 2020-04-19 NOTE — Progress Notes (Signed)
[facility] Progress Note   Subjective: Per VVS note, stable for DC back to SNF. On HD increased BLE edema. UFG adjusted. Purulent drainage on drsg R foot. No C/Os.   Objective Vitals:   04/19/20 0721 04/19/20 0728 04/19/20 0730 04/19/20 0800  BP: (!) 128/56 (!) 134/55 130/61 (!) 119/57  Pulse: 77 75 76 80  Resp: 18     Temp: 98.6 F (37 C)     TempSrc: Oral     SpO2: 100%     Weight: 119.1 kg     Height:       Physical Exam General:Pleasant obese older female in NAD Heart:S1,S2 No M/R/G Lungs:CTAB Abdomen:obese, NT Extremities:1+pitting BLE edema.Ace wrapboth feet, purulent drainage on drsg Dialysis Access:L AVF using today with 16g needles for 2nd time.  RIJ TDC drsg CDI   Additional Objective Labs: Basic Metabolic Panel: Recent Labs  Lab 04/16/20 0236 04/17/20 1220 04/19/20 0731  NA 135 136 137  K 5.0 4.4 4.4  CL 95* 99 99  CO2 24 22 24   GLUCOSE 129* 175* 181*  BUN 52* 50* 57*  CREATININE 8.49* 6.88* 7.10*  CALCIUM 8.5* 8.5* 8.3*  PHOS 4.6 4.0 4.0   Liver Function Tests: Recent Labs  Lab 04/16/20 0236 04/17/20 1220 04/19/20 0731  ALBUMIN 2.0* 2.1* 2.1*   No results for input(s): LIPASE, AMYLASE in the last 168 hours. CBC: Recent Labs  Lab 04/16/20 0236 04/16/20 0236 04/17/20 0348 04/17/20 0348 04/17/20 1220 04/18/20 0244 04/19/20 0208  WBC 10.0   < > 10.1   < > 10.4 9.2 10.4  HGB 8.0*   < > 9.0*   < > 8.7* 8.2* 8.8*  HCT 25.3*   < > 29.3*   < > 28.5* 27.1* 29.2*  MCV 85.8  --  88.5  --  88.5 86.6 87.7  PLT 437*   < > 442*   < > 434* 349 419*   < > = values in this interval not displayed.   Blood Culture    Component Value Date/Time   SDES BLOOD RIGHT HAND 04/12/2020 1651   SPECREQUEST AEROBIC BOTTLE ONLY Blood Culture adequate volume 04/12/2020 1651   CULT  04/12/2020 1651    NO GROWTH 5 DAYS Performed at [facility], [place] [address], [place],  [address]    REPTSTATUS 04/17/2020 FINAL 04/12/2020  1651    Cardiac Enzymes: No results for input(s): CKTOTAL, CKMB, CKMBINDEX, TROPONINI in the last 168 hours. CBG: Recent Labs  Lab 04/18/20 1226 04/18/20 1803 04/18/20 1843 04/18/20 2010 04/19/20 0520  GLUCAP 266* 60* 70 144* 183*   Iron Studies: No results for input(s): IRON, TIBC, TRANSFERRIN, FERRITIN in the last 72 hours. @lablastinr3 @ Studies/Results: No results found. Medications: . sodium chloride    . sodium chloride     . sodium chloride   Intravenous Once  . allopurinol  100 mg Oral Daily  . atorvastatin  40 mg Oral q1800  . buPROPion  150 mg Oral BID  . calcitRIOL  2.25 mcg Oral Q T,Th,Sat-1800  . Chlorhexidine Gluconate Cloth  6 each Topical Q0600  . cinacalcet  30 mg Oral Q T,Th,Sat-1800  . [START ON 04/21/2020] darbepoetin (ARANESP) injection - DIALYSIS  200 mcg Intravenous Q Sat-HD  . gabapentin  300 mg Oral QHS  . insulin aspart  0-5 Units Subcutaneous QHS  . insulin aspart  0-9 Units Subcutaneous TID WC  . insulin aspart  2 Units Subcutaneous TID WC  . latanoprost  1 drop  Both Eyes QHS  . linaclotide  72 mcg Oral QAC breakfast  . loratadine  10 mg Oral Daily  . metoprolol tartrate  25 mg Oral BID  . pantoprazole  40 mg Oral Daily  . polyethylene glycol  17 g Oral BID  . senna-docusate  1 tablet Oral BID  . sodium chloride irrigation  1,000 mL Irrigation Once  . sucroferric oxyhydroxide  1,000 mg Oral TID WC     Dialysis Orders: [facility] TTS  4h67min 400/800 118kg 2K/2.5CaRIJTDC No heparin - Venofer 100mg  IVq HD until 4/17 - Mircera 273mcg IV q 2 weeks(last 4/1) - Sensipar 30mg  PO TIW - Calcitriol 2.39mcg PO TIW  Assessment/Plan: 1. Recurrent GI bleeding:Hx AVMs. 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 2. ABLA + anemia of ESRD: Hgb 6.8 on admit -> s/p 2U PRBCs, then down again with another 2U given 4/16.Aranesp 200 mcg  IVgiven 4/16.HGB8.8 today. 3. L foot osteomyelitis: ID following- s/p1st toe transmetarsal amputation4/14. Per VVS note, foot is not healing, will probably need further amputations-to be evaluated as OPl. Blood Cx negative.Off ABX. 4. ESRD:HD today off schedule.Had HD off schedule 04/19 short HD ordered for today to get back on schedule. 5. Hypertension/volume - BP low/stable. UF as tolerated today.  6. Metabolic bone disease -Continue binders/Sensipar/Calcitriol for now 7. Nutrition -Renal diet/prot supp 8. R foot ulcers: VVS planning for RLE angiogram in future.Needs wound care.  Disposition- discharge to SNF hopefully today.  Lania Zawistowski H. Brigham Cobbins NP-C 04/19/2020, 8:22 AM  [employer] [phone]

## 2020-04-19 NOTE — Progress Notes (Signed)
AVS, signed printed prescription, and social worker's paperwork placed in discharge packet. Report called and given to Thurmond Butts, LPN at [facility]. All questions answered to satisfaction. PTAR to transport pt with all belongings.

## 2020-04-19 NOTE — Social Work (Signed)
Clinical Social Worker facilitated patient discharge including contacting patient family and facility to confirm patient discharge plans.  Clinical information faxed to facility and family agreeable with plan.  CSW arranged ambulance transport via PT[facility] to [facility]  RN to call [phone]  with report prior to discharge.  Clinical Social Worker will sign off for now as social work intervention is no longer needed. Please consult [place] again if new need arises.  Westley Hummer, MSW, LCSW Clinical Social Worker

## 2020-04-19 NOTE — Plan of Care (Signed)
  Problem: Education: Goal: Knowledge of disease and its progression will improve Outcome: Progressing Goal: Individualized Educational Video(s) Outcome: Progressing   Problem: Fluid Volume: Goal: Compliance with measures to maintain balanced fluid volume will improve Outcome: Progressing   Problem: Health Behavior/Discharge Planning: Goal: Ability to manage health-related needs will improve Outcome: Progressing   Problem: Nutritional: Goal: Ability to make healthy dietary choices will improve Outcome: Progressing   Problem: Clinical Measurements: Goal: Complications related to the disease process, condition or treatment will be avoided or minimized Outcome: Progressing   Problem: Education: Goal: Knowledge of General Education information will improve Description: Including pain rating scale, medication(s)/side effects and non-pharmacologic comfort measures Outcome: Progressing   Problem: Health Behavior/Discharge Planning: Goal: Ability to manage health-related needs will improve Outcome: Progressing   Problem: Clinical Measurements: Goal: Ability to maintain clinical measurements within normal limits will improve Outcome: Progressing Goal: Will remain free from infection Outcome: Progressing Goal: Diagnostic test results will improve Outcome: Progressing Goal: Respiratory complications will improve Outcome: Progressing Goal: Cardiovascular complication will be avoided Outcome: Progressing   Problem: Activity: Goal: Risk for activity intolerance will decrease Outcome: Progressing   Problem: Coping: Goal: Level of anxiety will decrease Outcome: Progressing   Problem: Elimination: Goal: Will not experience complications related to bowel motility Outcome: Progressing Goal: Will not experience complications related to urinary retention Outcome: Progressing   Problem: Pain Managment: Goal: General experience of comfort will improve Outcome: Progressing    Problem: Safety: Goal: Ability to remain free from injury will improve Outcome: Progressing

## 2020-04-25 ENCOUNTER — Emergency Department (HOSPITAL_COMMUNITY): Payer: Medicare (Managed Care)

## 2020-04-25 ENCOUNTER — Inpatient Hospital Stay (HOSPITAL_COMMUNITY)
Admission: EM | Admit: 2020-04-25 | Discharge: 2020-05-29 | DRG: 871 | Disposition: E | Payer: Medicare (Managed Care) | Source: Skilled Nursing Facility | Attending: Internal Medicine | Admitting: Internal Medicine

## 2020-04-25 DIAGNOSIS — E1152 Type 2 diabetes mellitus with diabetic peripheral angiopathy with gangrene: Secondary | ICD-10-CM | POA: Diagnosis present

## 2020-04-25 DIAGNOSIS — D631 Anemia in chronic kidney disease: Secondary | ICD-10-CM | POA: Diagnosis present

## 2020-04-25 DIAGNOSIS — A419 Sepsis, unspecified organism: Principal | ICD-10-CM | POA: Diagnosis present

## 2020-04-25 DIAGNOSIS — N2581 Secondary hyperparathyroidism of renal origin: Secondary | ICD-10-CM | POA: Diagnosis present

## 2020-04-25 DIAGNOSIS — G9340 Encephalopathy, unspecified: Secondary | ICD-10-CM | POA: Diagnosis not present

## 2020-04-25 DIAGNOSIS — Z992 Dependence on renal dialysis: Secondary | ICD-10-CM

## 2020-04-25 DIAGNOSIS — I5043 Acute on chronic combined systolic (congestive) and diastolic (congestive) heart failure: Secondary | ICD-10-CM | POA: Diagnosis present

## 2020-04-25 DIAGNOSIS — U071 COVID-19: Secondary | ICD-10-CM | POA: Diagnosis present

## 2020-04-25 DIAGNOSIS — Z515 Encounter for palliative care: Secondary | ICD-10-CM | POA: Diagnosis not present

## 2020-04-25 DIAGNOSIS — Z8673 Personal history of transient ischemic attack (TIA), and cerebral infarction without residual deficits: Secondary | ICD-10-CM

## 2020-04-25 DIAGNOSIS — I5042 Chronic combined systolic (congestive) and diastolic (congestive) heart failure: Secondary | ICD-10-CM | POA: Diagnosis present

## 2020-04-25 DIAGNOSIS — K219 Gastro-esophageal reflux disease without esophagitis: Secondary | ICD-10-CM | POA: Diagnosis present

## 2020-04-25 DIAGNOSIS — I70261 Atherosclerosis of native arteries of extremities with gangrene, right leg: Secondary | ICD-10-CM | POA: Diagnosis present

## 2020-04-25 DIAGNOSIS — E875 Hyperkalemia: Secondary | ICD-10-CM | POA: Diagnosis present

## 2020-04-25 DIAGNOSIS — R195 Other fecal abnormalities: Secondary | ICD-10-CM | POA: Diagnosis not present

## 2020-04-25 DIAGNOSIS — I480 Paroxysmal atrial fibrillation: Secondary | ICD-10-CM | POA: Diagnosis present

## 2020-04-25 DIAGNOSIS — J9601 Acute respiratory failure with hypoxia: Secondary | ICD-10-CM

## 2020-04-25 DIAGNOSIS — Z83438 Family history of other disorder of lipoprotein metabolism and other lipidemia: Secondary | ICD-10-CM

## 2020-04-25 DIAGNOSIS — E1122 Type 2 diabetes mellitus with diabetic chronic kidney disease: Secondary | ICD-10-CM | POA: Diagnosis present

## 2020-04-25 DIAGNOSIS — Z66 Do not resuscitate: Secondary | ICD-10-CM | POA: Diagnosis not present

## 2020-04-25 DIAGNOSIS — Z8249 Family history of ischemic heart disease and other diseases of the circulatory system: Secondary | ICD-10-CM

## 2020-04-25 DIAGNOSIS — Z794 Long term (current) use of insulin: Secondary | ICD-10-CM

## 2020-04-25 DIAGNOSIS — I96 Gangrene, not elsewhere classified: Secondary | ICD-10-CM

## 2020-04-25 DIAGNOSIS — E11621 Type 2 diabetes mellitus with foot ulcer: Secondary | ICD-10-CM | POA: Diagnosis present

## 2020-04-25 DIAGNOSIS — R4182 Altered mental status, unspecified: Secondary | ICD-10-CM | POA: Diagnosis present

## 2020-04-25 DIAGNOSIS — K3184 Gastroparesis: Secondary | ICD-10-CM | POA: Diagnosis present

## 2020-04-25 DIAGNOSIS — I4891 Unspecified atrial fibrillation: Secondary | ICD-10-CM | POA: Diagnosis not present

## 2020-04-25 DIAGNOSIS — N186 End stage renal disease: Secondary | ICD-10-CM | POA: Diagnosis present

## 2020-04-25 DIAGNOSIS — H409 Unspecified glaucoma: Secondary | ICD-10-CM | POA: Diagnosis present

## 2020-04-25 DIAGNOSIS — E11649 Type 2 diabetes mellitus with hypoglycemia without coma: Secondary | ICD-10-CM | POA: Diagnosis not present

## 2020-04-25 DIAGNOSIS — K5521 Angiodysplasia of colon with hemorrhage: Secondary | ICD-10-CM | POA: Diagnosis not present

## 2020-04-25 DIAGNOSIS — R627 Adult failure to thrive: Secondary | ICD-10-CM | POA: Diagnosis not present

## 2020-04-25 DIAGNOSIS — E8770 Fluid overload, unspecified: Secondary | ICD-10-CM | POA: Diagnosis not present

## 2020-04-25 DIAGNOSIS — G9341 Metabolic encephalopathy: Secondary | ICD-10-CM | POA: Diagnosis present

## 2020-04-25 DIAGNOSIS — E1143 Type 2 diabetes mellitus with diabetic autonomic (poly)neuropathy: Secondary | ICD-10-CM | POA: Diagnosis present

## 2020-04-25 DIAGNOSIS — Z89412 Acquired absence of left great toe: Secondary | ICD-10-CM

## 2020-04-25 DIAGNOSIS — E785 Hyperlipidemia, unspecified: Secondary | ICD-10-CM | POA: Diagnosis present

## 2020-04-25 DIAGNOSIS — K449 Diaphragmatic hernia without obstruction or gangrene: Secondary | ICD-10-CM | POA: Diagnosis present

## 2020-04-25 DIAGNOSIS — K921 Melena: Secondary | ICD-10-CM

## 2020-04-25 DIAGNOSIS — I2510 Atherosclerotic heart disease of native coronary artery without angina pectoris: Secondary | ICD-10-CM | POA: Diagnosis present

## 2020-04-25 DIAGNOSIS — Z9889 Other specified postprocedural states: Secondary | ICD-10-CM | POA: Diagnosis not present

## 2020-04-25 DIAGNOSIS — Z809 Family history of malignant neoplasm, unspecified: Secondary | ICD-10-CM

## 2020-04-25 DIAGNOSIS — D62 Acute posthemorrhagic anemia: Secondary | ICD-10-CM | POA: Diagnosis not present

## 2020-04-25 DIAGNOSIS — Z6841 Body Mass Index (BMI) 40.0 and over, adult: Secondary | ICD-10-CM

## 2020-04-25 DIAGNOSIS — K5520 Angiodysplasia of colon without hemorrhage: Secondary | ICD-10-CM | POA: Diagnosis not present

## 2020-04-25 DIAGNOSIS — E1169 Type 2 diabetes mellitus with other specified complication: Secondary | ICD-10-CM | POA: Diagnosis present

## 2020-04-25 DIAGNOSIS — Z833 Family history of diabetes mellitus: Secondary | ICD-10-CM

## 2020-04-25 DIAGNOSIS — D500 Iron deficiency anemia secondary to blood loss (chronic): Secondary | ICD-10-CM | POA: Diagnosis not present

## 2020-04-25 DIAGNOSIS — M109 Gout, unspecified: Secondary | ICD-10-CM | POA: Diagnosis present

## 2020-04-25 DIAGNOSIS — Z823 Family history of stroke: Secondary | ICD-10-CM

## 2020-04-25 DIAGNOSIS — I4892 Unspecified atrial flutter: Secondary | ICD-10-CM | POA: Diagnosis present

## 2020-04-25 DIAGNOSIS — I10 Essential (primary) hypertension: Secondary | ICD-10-CM | POA: Diagnosis present

## 2020-04-25 DIAGNOSIS — Z7189 Other specified counseling: Secondary | ICD-10-CM | POA: Diagnosis not present

## 2020-04-25 DIAGNOSIS — K635 Polyp of colon: Secondary | ICD-10-CM | POA: Diagnosis not present

## 2020-04-25 DIAGNOSIS — I132 Hypertensive heart and chronic kidney disease with heart failure and with stage 5 chronic kidney disease, or end stage renal disease: Secondary | ICD-10-CM | POA: Diagnosis present

## 2020-04-25 DIAGNOSIS — K922 Gastrointestinal hemorrhage, unspecified: Secondary | ICD-10-CM | POA: Diagnosis not present

## 2020-04-25 DIAGNOSIS — R6521 Severe sepsis with septic shock: Secondary | ICD-10-CM | POA: Diagnosis not present

## 2020-04-25 DIAGNOSIS — I252 Old myocardial infarction: Secondary | ICD-10-CM

## 2020-04-25 DIAGNOSIS — Z886 Allergy status to analgesic agent status: Secondary | ICD-10-CM

## 2020-04-25 DIAGNOSIS — M869 Osteomyelitis, unspecified: Secondary | ICD-10-CM | POA: Diagnosis present

## 2020-04-25 DIAGNOSIS — Z7982 Long term (current) use of aspirin: Secondary | ICD-10-CM

## 2020-04-25 DIAGNOSIS — Z049 Encounter for examination and observation for unspecified reason: Secondary | ICD-10-CM

## 2020-04-25 DIAGNOSIS — R933 Abnormal findings on diagnostic imaging of other parts of digestive tract: Secondary | ICD-10-CM | POA: Diagnosis not present

## 2020-04-25 DIAGNOSIS — Z7951 Long term (current) use of inhaled steroids: Secondary | ICD-10-CM

## 2020-04-25 DIAGNOSIS — K648 Other hemorrhoids: Secondary | ICD-10-CM | POA: Diagnosis present

## 2020-04-25 DIAGNOSIS — Z841 Family history of disorders of kidney and ureter: Secondary | ICD-10-CM

## 2020-04-25 DIAGNOSIS — K3189 Other diseases of stomach and duodenum: Secondary | ICD-10-CM | POA: Diagnosis not present

## 2020-04-25 DIAGNOSIS — L97519 Non-pressure chronic ulcer of other part of right foot with unspecified severity: Secondary | ICD-10-CM | POA: Diagnosis present

## 2020-04-25 DIAGNOSIS — Z951 Presence of aortocoronary bypass graft: Secondary | ICD-10-CM

## 2020-04-25 DIAGNOSIS — F17210 Nicotine dependence, cigarettes, uncomplicated: Secondary | ICD-10-CM | POA: Diagnosis present

## 2020-04-25 LAB — COMPREHENSIVE METABOLIC PANEL
ALT: 7 U/L (ref 0–44)
AST: 21 U/L (ref 15–41)
Albumin: 2.4 g/dL — ABNORMAL LOW (ref 3.5–5.0)
Alkaline Phosphatase: 102 U/L (ref 38–126)
Anion gap: 19 — ABNORMAL HIGH (ref 5–15)
BUN: 81 mg/dL — ABNORMAL HIGH (ref 8–23)
CO2: 22 mmol/L (ref 22–32)
Calcium: 8.2 mg/dL — ABNORMAL LOW (ref 8.9–10.3)
Chloride: 97 mmol/L — ABNORMAL LOW (ref 98–111)
Creatinine, Ser: 11.32 mg/dL — ABNORMAL HIGH (ref 0.44–1.00)
GFR calc Af Amer: 4 mL/min — ABNORMAL LOW (ref >60)
GFR calc non Af Amer: 3 mL/min — ABNORMAL LOW (ref >60)
Glucose, Bld: 89 mg/dL (ref 70–99)
Potassium: 6.6 mmol/L (ref 3.5–5.1)
Sodium: 138 mmol/L (ref 135–145)
Total Bilirubin: 0.7 mg/dL (ref 0.3–1.2)
Total Protein: 6.8 g/dL (ref 6.5–8.1)

## 2020-04-25 LAB — CBC WITH DIFFERENTIAL/PLATELET
Abs Immature Granulocytes: 0.05 10*3/uL (ref 0.00–0.07)
Basophils Absolute: 0.1 10*3/uL (ref 0.0–0.1)
Basophils Relative: 1 %
Eosinophils Absolute: 0 10*3/uL (ref 0.0–0.5)
Eosinophils Relative: 1 %
HCT: 29 % — ABNORMAL LOW (ref 36.0–46.0)
Hemoglobin: 8.8 g/dL — ABNORMAL LOW (ref 12.0–15.0)
Immature Granulocytes: 1 %
Lymphocytes Relative: 16 %
Lymphs Abs: 1.3 10*3/uL (ref 0.7–4.0)
MCH: 26.1 pg (ref 26.0–34.0)
MCHC: 30.3 g/dL (ref 30.0–36.0)
MCV: 86.1 fL (ref 80.0–100.0)
Monocytes Absolute: 0.7 10*3/uL (ref 0.1–1.0)
Monocytes Relative: 9 %
Neutro Abs: 5.8 10*3/uL (ref 1.7–7.7)
Neutrophils Relative %: 72 %
Platelets: 416 10*3/uL — ABNORMAL HIGH (ref 150–400)
RBC: 3.37 MIL/uL — ABNORMAL LOW (ref 3.87–5.11)
RDW: 18.9 % — ABNORMAL HIGH (ref 11.5–15.5)
WBC: 7.9 10*3/uL (ref 4.0–10.5)
nRBC: 0 % (ref 0.0–0.2)

## 2020-04-25 LAB — RESPIRATORY PANEL BY RT PCR (FLU A&B, COVID)
Influenza A by PCR: NEGATIVE
Influenza B by PCR: NEGATIVE
SARS Coronavirus 2 by RT PCR: POSITIVE — AB

## 2020-04-25 LAB — CBG MONITORING, ED
Glucose-Capillary: 50 mg/dL — ABNORMAL LOW (ref 70–99)
Glucose-Capillary: 76 mg/dL (ref 70–99)
Glucose-Capillary: 77 mg/dL (ref 70–99)

## 2020-04-25 LAB — HEPATITIS B SURFACE ANTIGEN: Hepatitis B Surface Ag: NONREACTIVE

## 2020-04-25 LAB — PROTIME-INR
INR: 1.3 — ABNORMAL HIGH (ref 0.8–1.2)
Prothrombin Time: 15.9 seconds — ABNORMAL HIGH (ref 11.4–15.2)

## 2020-04-25 LAB — LACTIC ACID, PLASMA: Lactic Acid, Venous: 1.5 mmol/L (ref 0.5–1.9)

## 2020-04-25 MED ORDER — SUCROFERRIC OXYHYDROXIDE 500 MG PO CHEW
1000.0000 mg | CHEWABLE_TABLET | Freq: Three times a day (TID) | ORAL | Status: DC
  Administered 2020-04-26 – 2020-04-28 (×8): 1000 mg via ORAL
  Filled 2020-04-25 (×11): qty 2

## 2020-04-25 MED ORDER — SODIUM CHLORIDE 0.9% FLUSH: 3.0000 mL | INTRAVENOUS | Status: DC | PRN

## 2020-04-25 MED ORDER — CINACALCET HCL 30 MG PO TABS: 30.0000 mg | ORAL_TABLET | ORAL | Status: DC

## 2020-04-25 MED ORDER — CINACALCET HCL 30 MG PO TABS
30.0000 mg | ORAL_TABLET | ORAL | Status: DC
  Administered 2020-04-26 – 2020-05-03 (×4): 30 mg via ORAL
  Filled 2020-04-25 (×5): qty 1

## 2020-04-25 MED ORDER — ASPIRIN EC 81 MG PO TBEC
81.0000 mg | DELAYED_RELEASE_TABLET | Freq: Every day | ORAL | Status: DC
  Administered 2020-04-26 – 2020-04-27 (×2): 81 mg via ORAL
  Filled 2020-04-25 (×2): qty 1

## 2020-04-25 MED ORDER — METOPROLOL TARTRATE 25 MG PO TABS
25.0000 mg | ORAL_TABLET | Freq: Two times a day (BID) | ORAL | Status: DC
  Administered 2020-04-26 – 2020-05-09 (×18): 25 mg via ORAL
  Filled 2020-04-25 (×22): qty 1

## 2020-04-25 MED ORDER — SODIUM CHLORIDE 0.9% FLUSH
3.0000 mL | Freq: Two times a day (BID) | INTRAVENOUS | Status: DC
  Administered 2020-04-25 – 2020-05-05 (×18): 3 mL via INTRAVENOUS

## 2020-04-25 MED ORDER — SODIUM CHLORIDE 0.9 % IV SOLN
100.0000 mg | Freq: Every day | INTRAVENOUS | Status: DC
  Administered 2020-04-26: 100 mg via INTRAVENOUS
  Filled 2020-04-25: qty 20

## 2020-04-25 MED ORDER — ALBUTEROL SULFATE (2.5 MG/3ML) 0.083% IN NEBU: 2.5000 mg | INHALATION_SOLUTION | Freq: Four times a day (QID) | RESPIRATORY_TRACT | Status: DC | PRN

## 2020-04-25 MED ORDER — ALLOPURINOL 100 MG PO TABS
100.0000 mg | ORAL_TABLET | Freq: Every day | ORAL | Status: DC
  Administered 2020-04-26 – 2020-05-09 (×12): 100 mg via ORAL
  Filled 2020-04-25 (×13): qty 1

## 2020-04-25 MED ORDER — VANCOMYCIN HCL 10 G IV SOLR
2500.0000 mg | Freq: Once | INTRAVENOUS | Status: AC
  Administered 2020-04-25: 18:00:00 2500 mg via INTRAVENOUS
  Filled 2020-04-25: qty 2500

## 2020-04-25 MED ORDER — DEXTROSE 50 % IV SOLN
25.0000 g | Freq: Once | INTRAVENOUS | Status: AC
  Administered 2020-04-25: 25 g via INTRAVENOUS
  Filled 2020-04-25: qty 50

## 2020-04-25 MED ORDER — POLYETHYLENE GLYCOL 3350 17 G PO PACK: 17.0000 g | PACK | Freq: Every day | ORAL | Status: DC | PRN

## 2020-04-25 MED ORDER — SODIUM CHLORIDE 0.9 % IV SOLN
200.0000 mg | Freq: Once | INTRAVENOUS | Status: AC
  Administered 2020-04-25: 23:00:00 200 mg via INTRAVENOUS
  Filled 2020-04-25: qty 40

## 2020-04-25 MED ORDER — CALCITRIOL 0.5 MCG PO CAPS: 2.2500 ug | ORAL_CAPSULE | ORAL | Status: DC

## 2020-04-25 MED ORDER — INSULIN ASPART 100 UNIT/ML ~~LOC~~ SOLN: 0.0000 [IU] | Freq: Three times a day (TID) | SUBCUTANEOUS | Status: DC

## 2020-04-25 MED ORDER — ATORVASTATIN CALCIUM 40 MG PO TABS
40.0000 mg | ORAL_TABLET | Freq: Every day | ORAL | Status: DC
  Administered 2020-04-26 – 2020-05-09 (×13): 40 mg via ORAL
  Filled 2020-04-25 (×13): qty 1

## 2020-04-25 MED ORDER — INSULIN GLARGINE 100 UNIT/ML ~~LOC~~ SOLN
10.0000 [IU] | Freq: Every day | SUBCUTANEOUS | Status: DC
  Filled 2020-04-25 (×2): qty 0.1

## 2020-04-25 MED ORDER — ACETAMINOPHEN 325 MG PO TABS
650.0000 mg | ORAL_TABLET | Freq: Four times a day (QID) | ORAL | Status: DC | PRN
  Administered 2020-04-25 – 2020-05-09 (×6): 650 mg via ORAL
  Filled 2020-04-25 (×6): qty 2

## 2020-04-25 MED ORDER — HEPARIN SODIUM (PORCINE) 1000 UNIT/ML IJ SOLN
INTRAMUSCULAR | Status: AC
  Administered 2020-04-25: 20:00:00 1000 [IU]
  Filled 2020-04-25: qty 3

## 2020-04-25 MED ORDER — GABAPENTIN 300 MG PO CAPS
300.0000 mg | ORAL_CAPSULE | Freq: Every day | ORAL | Status: DC
  Administered 2020-04-25 – 2020-05-08 (×14): 300 mg via ORAL
  Filled 2020-04-25: qty 3
  Filled 2020-04-25 (×13): qty 1

## 2020-04-25 MED ORDER — CALCITRIOL 0.25 MCG PO CAPS
2.2500 ug | ORAL_CAPSULE | ORAL | Status: DC
  Administered 2020-04-26 – 2020-05-03 (×2): 2.25 ug via ORAL
  Filled 2020-04-25 (×3): qty 9
  Filled 2020-04-25: qty 1

## 2020-04-25 MED ORDER — SODIUM CHLORIDE 0.9 % IV SOLN: 250.0000 mL | INTRAVENOUS | Status: DC | PRN

## 2020-04-25 MED ORDER — LORATADINE 10 MG PO TABS
10.0000 mg | ORAL_TABLET | Freq: Every day | ORAL | Status: DC
  Administered 2020-04-25 – 2020-05-09 (×13): 10 mg via ORAL
  Filled 2020-04-25 (×14): qty 1

## 2020-04-25 MED ORDER — VANCOMYCIN VARIABLE DOSE PER UNSTABLE RENAL FUNCTION (PHARMACIST DOSING): Status: DC

## 2020-04-25 MED ORDER — SUCROFERRIC OXYHYDROXIDE 500 MG PO CHEW
1000.0000 mg | CHEWABLE_TABLET | Freq: Three times a day (TID) | ORAL | Status: DC
  Administered 2020-04-26 (×3): 1000 mg via ORAL
  Filled 2020-04-25 (×3): qty 2

## 2020-04-25 MED ORDER — BUPROPION HCL ER (SR) 150 MG PO TB12
150.0000 mg | ORAL_TABLET | Freq: Two times a day (BID) | ORAL | Status: DC
  Administered 2020-04-25 – 2020-05-09 (×26): 150 mg via ORAL
  Filled 2020-04-25 (×28): qty 1

## 2020-04-25 MED ORDER — LINACLOTIDE 72 MCG PO CAPS
72.0000 ug | ORAL_CAPSULE | Freq: Every day | ORAL | Status: DC
  Administered 2020-04-26 – 2020-05-09 (×12): 72 ug via ORAL
  Filled 2020-04-25 (×15): qty 1

## 2020-04-25 MED ORDER — PANTOPRAZOLE SODIUM 40 MG PO TBEC
40.0000 mg | DELAYED_RELEASE_TABLET | Freq: Every day | ORAL | Status: DC
  Administered 2020-04-26: 10:00:00 40 mg via ORAL
  Filled 2020-04-25: qty 1

## 2020-04-25 MED ORDER — MOMETASONE FURO-FORMOTEROL FUM 100-5 MCG/ACT IN AERO
2.0000 | INHALATION_SPRAY | RESPIRATORY_TRACT | Status: DC | PRN
  Filled 2020-04-25: qty 8.8

## 2020-04-25 MED ORDER — NITROGLYCERIN 0.4 MG SL SUBL: 0.4000 mg | SUBLINGUAL_TABLET | SUBLINGUAL | Status: DC | PRN

## 2020-04-25 MED ORDER — OXYCODONE HCL 5 MG PO TABS
5.0000 mg | ORAL_TABLET | ORAL | Status: DC | PRN
  Administered 2020-05-01 – 2020-05-07 (×7): 5 mg via ORAL
  Filled 2020-04-25 (×8): qty 1

## 2020-04-25 MED ORDER — CHLORHEXIDINE GLUCONATE CLOTH 2 % EX PADS
6.0000 | MEDICATED_PAD | Freq: Every day | CUTANEOUS | Status: DC
  Administered 2020-04-27 – 2020-04-30 (×4): 6 via TOPICAL

## 2020-04-25 MED ORDER — PIPERACILLIN-TAZOBACTAM 3.375 G IVPB 30 MIN
3.3750 g | Freq: Once | INTRAVENOUS | Status: AC
  Administered 2020-04-25: 3.375 g via INTRAVENOUS
  Filled 2020-04-25: qty 50

## 2020-04-25 NOTE — ED Notes (Signed)
ED PA made aware of pt K+

## 2020-04-25 NOTE — ED Notes (Signed)
This RN paged pharmacy to verify this pts medications and to send missing medications once verified.

## 2020-04-25 NOTE — Consult Note (Addendum)
Hospital Consult    Reason for Consult:  Right foot ischemia Requesting Physician:  ED MRN #:  [id number]  History of Present Illness: This is a 69 y.o. female with a known history of occlusive peripheral disease who presents emergency department with approximately 1 day history of altered mental status.  She was recently discharged from the hospital and transferred to [facility] after undergoing left first toe transmetatarsal amputation April 11, 2020.  She is status post atherectomy and stent placement of left superficial femoral artery on February 06, 2020 by Dr. Donzetta Matters.  The patient is examined in emergency department.  She can state that since the year is 2011.  Follows commands.  She has a history of end-stage renal disease on chronic hemodialysis.  She dialyzes via left upper extremity AV fistula.  She was last dialyzed yesterday.  The pt is on a statin for cholesterol management.  The pt is on a daily aspirin.   Other AC:  none The pt is on BB for hypertension.   The pt is diabetic.  insulin  Past Medical History:  Diagnosis Date  . Abscess   . Acute blood loss anemia 08/17/2019  . Acute pulmonary edema (HCC)   . Acute respiratory failure ([place]) 10/18/2014  . Acute respiratory failure with hypoxia and hypercapnia ([place]) 06/01/2019  . AMS (altered mental status) 01/01/2020  . Anemia 08/2016  . Angiodysplasia of colon   . Arthritis of left shoulder region 03/23/2013  . Bleeding gastrointestinal   . Cardiomegaly 05/2019  . Chest pain 04/17/2016  . Chronic combined systolic and diastolic CHF (congestive heart failure) (HCC)    a. EF 40-45%, mild LVH, mid apicalanteroseptal and apical HK.  . CKD (chronic kidney disease), stage III   . Cocaine abuse ([place])    crack cocaine heavily until 2008 then sporadic use since then  . Coronary artery disease    a. 06/2012 NSTEMI/CABG x 3 (LIMA->LAD, VG->OM2, VG->LCX);  b. 04/2015 MV: EF<30%, mid ant, apicalanterior, apical infarct;  c. 04/2015  Cath: LM nl, LAD 90p, LCX 32m, OM1 min irregs, RCA mild dzs, LIMA->LAD nl w/ dist LAD dzs, VG->OM2 nl, VG->LCX nl-->Med Rx.  . CVA (cerebral infarction)    a. right internal capsule stroke in 12/2006  . Demand ischemia ([place])   . Diabetes mellitus    diagnosed in 2008  . Elevated troponin 04/27/2019  . Essential hypertension   . Glaucoma   . Gout   . Heme positive stool   . HFrEF (heart failure with reduced ejection fraction) ([place])   . Hyperlipidemia   . Hyperparathyroidism, secondary renal ([place])   . Hypertensive crisis 06/02/2019  . Left-sided sensory deficit present   . Lobar pneumonia ([place]) 04/27/2019  . Obesity, morbid ([place])   . Pneumonia   . Pulmonary edema 05/2019  . PVD (peripheral vascular disease) ([place])    a. 06/2012 ABI's: R - 0.73, L - 0.71.  Marland Kitchen Renal mass, right   . Sepsis ([place]) 04/27/2019  . Shortness of breath dyspnea   . Stroke ([place])   . Thrombocytosis ([place]) 04/17/2016  . Thyroid nodule    FNA in 4098 showed follicular cells but not definate neoplasm  . Tobacco abuse   . Trichomoniasis     Past Surgical History:  Procedure Laterality Date  . ABDOMINAL AORTOGRAM W/LOWER EXTREMITY Bilateral 02/06/2020   Procedure: ABDOMINAL AORTOGRAM W/LOWER EXTREMITY;  Surgeon: Waynetta Sandy, MD;  Location: [place] [facility];  Service: Cardiovascular;  Laterality: Bilateral;  . AMPUTATION  Left 02/07/2020   Procedure: AMPUTATION DIGIT - LEFT GREAT TOE;  Surgeon: Waynetta Sandy, MD;  Location: [place];  Service: Vascular;  Laterality: Left;  . AMPUTATION Left 04/11/2020   Procedure: Transmetatarsal Amputation left 1st Toe.;  Surgeon: Waynetta Sandy, MD;  Location: [place];  Service: Vascular;  Laterality: Left;  . AV FISTULA PLACEMENT Left 08/25/2019   Procedure: ARTERIOVENOUS (AV) BRACHIOCEPHALIC FISTULA CREATION;  Surgeon: Waynetta Sandy, MD;  Location: [place];  Service: Vascular;  Laterality: Left;  . CARDIAC CATHETERIZATION    . CARDIAC  CATHETERIZATION N/A 05/17/2015   Procedure: Left Heart Cath and Cors/Grafts Angiography;  Surgeon: [name], MD;  Location: [place] [facility];  Service: Cardiovascular;  Laterality: N/A;  . COLONOSCOPY Left 02/25/2020   Procedure: COLONOSCOPY;  Surgeon: Lavena Bullion, DO;  Location: [place];  Service: Gastroenterology;  Laterality: Left;  . COLONOSCOPY WITH PROPOFOL N/A 04/21/2016   Procedure: COLONOSCOPY WITH PROPOFOL;  Surgeon: Irene Shipper, MD;  Location: [facility];  Service: Endoscopy;  Laterality: N/A;  . CORONARY ARTERY BYPASS GRAFT  07/09/2012   Procedure: CORONARY ARTERY BYPASS GRAFTING (CABG);  Surgeon: Ivin Poot, MD;  Location: [place];  Service: Open Heart Surgery;  Laterality: N/A;  . ENTEROSCOPY N/A 10/07/2019   Procedure: ENTEROSCOPY;  Surgeon: Jackquline Denmark, MD;  Location: [facility] ENDOSCOPY;  Service: Endoscopy;  Laterality: N/A;  . ENTEROSCOPY Left 02/25/2020   Procedure: ENTEROSCOPY;  Surgeon: Lavena Bullion, DO;  Location: [place];  Service: Gastroenterology;  Laterality: Left;  . ENTEROSCOPY N/A 04/07/2020   Procedure: ENTEROSCOPY;  Surgeon: Yetta Flock, MD;  Location: [facility] ENDOSCOPY;  Service: Gastroenterology;  Laterality: N/A;  . ESOPHAGOGASTRODUODENOSCOPY N/A 04/20/2016   Procedure: ESOPHAGOGASTRODUODENOSCOPY (EGD);  Surgeon: Gatha Mayer, MD;  Location: [facility] ENDOSCOPY;  Service: Endoscopy;  Laterality: N/A;  . ESOPHAGOGASTRODUODENOSCOPY (EGD) WITH PROPOFOL N/A 08/17/2019   Procedure: ESOPHAGOGASTRODUODENOSCOPY (EGD) WITH PROPOFOL;  Surgeon: Irene Shipper, MD;  Location: [facility] ENDOSCOPY;  Service: Endoscopy;  Laterality: N/A;  . HEMOSTASIS CLIP PLACEMENT  02/25/2020   Procedure: HEMOSTASIS CLIP PLACEMENT;  Surgeon: Lavena Bullion, DO;  Location: [place];  Service: Gastroenterology;;  . HOT HEMOSTASIS N/A 02/25/2020   Procedure: HOT HEMOSTASIS (ARGON PLASMA COAGULATION/BICAP);  Surgeon: Lavena Bullion, DO;  Location: [facility] ENDOSCOPY;  Service:  Gastroenterology;  Laterality: N/A;  . HOT HEMOSTASIS N/A 04/07/2020   Procedure: HOT HEMOSTASIS (ARGON PLASMA COAGULATION/BICAP);  Surgeon: Yetta Flock, MD;  Location: [facility] ENDOSCOPY;  Service: Gastroenterology;  Laterality: N/A;  . INSERTION OF DIALYSIS CATHETER Right 08/25/2019   Procedure: INSERTION OF DIALYSIS CATHETER;  Surgeon: Waynetta Sandy, MD;  Location: [place];  Service: Vascular;  Laterality: Right;  . IR FLUORO GUIDE CV LINE RIGHT  08/19/2019  . IR US GUIDE VASC ACCESS RIGHT  08/19/2019  . LEFT HEART CATHETERIZATION WITH CORONARY ANGIOGRAM N/A 06/29/2012   Procedure: LEFT HEART CATHETERIZATION WITH CORONARY ANGIOGRAM;  Surgeon: Peter M Martinique, MD;  Location: [facility] CATH LAB;  Service: Cardiovascular;  Laterality: N/A;  . PERIPHERAL VASCULAR ATHERECTOMY Left 02/06/2020   Procedure: PERIPHERAL VASCULAR ATHERECTOMY;  Surgeon: Waynetta Sandy, MD;  Location: [place] [facility];  Service: Cardiovascular;  Laterality: Left;  SFA  . PERIPHERAL VASCULAR INTERVENTION Left 02/06/2020   Procedure: PERIPHERAL VASCULAR INTERVENTION;  Surgeon: Waynetta Sandy, MD;  Location: [place] [facility];  Service: Cardiovascular;  Laterality: Left;  SFA  . REVISON OF ARTERIOVENOUS FISTULA Left 02/07/2020   Procedure: REVISON OF ARTERIOVENOUS FISTULA;  Surgeon: Waynetta Sandy, MD;  Location: [place];  Service: Vascular;  Laterality: Left;  . STERNAL WOUND DEBRIDEMENT  08/17/2012   Procedure: STERNAL WOUND DEBRIDEMENT;  Surgeon: Ivin Poot, MD;  Location: [place];  Service: Thoracic;  Laterality: N/A;  wound vac application  . STERNAL WOUND DEBRIDEMENT  08/24/2012   Procedure: STERNAL WOUND DEBRIDEMENT;  Surgeon: Ivin Poot, MD;  Location: [place];  Service: Thoracic;  Laterality: N/A;  . STERNAL WOUND DEBRIDEMENT  09/01/2012   Procedure: STERNAL WOUND DEBRIDEMENT;  Surgeon: Ivin Poot, MD;  Location: [place];  Service: Thoracic;  Laterality: N/A;  . STERNAL WOUND  DEBRIDEMENT  09/20/2012   Procedure: STERNAL WOUND DEBRIDEMENT;  Surgeon: Ivin Poot, MD;  Location: [facility] OR;  Service: Thoracic;  Laterality: N/A;  wound vac change  . SUBMUCOSAL TATTOO INJECTION  10/07/2019   Procedure: SUBMUCOSAL TATTOO INJECTION;  Surgeon: Jackquline Denmark, MD;  Location: [facility] ENDOSCOPY;  Service: Endoscopy;;    Allergies  Allergen Reactions  . Naproxen Rash    Prior to Admission medications   Medication Sig Start Date End Date Taking? Authorizing Provider  albuterol (VENTOLIN HFA) 108 (90 Base) MCG/ACT inhaler Inhale 2 puffs into the lungs every 6 (six) hours as needed for wheezing or shortness of breath.    [provider]  allopurinol (ZYLOPRIM) 100 MG tablet Take 1 tablet (100 mg total) by mouth daily. 02/28/20   Gladys Damme, MD  aspirin 81 MG EC tablet Take 1 tablet (81 mg total) by mouth daily. 02/28/20   Gladys Damme, MD  atorvastatin (LIPITOR) 40 MG tablet Take 1 tablet (40 mg total) by mouth daily at 6 PM. 02/28/20   Gladys Damme, MD  buPROPion [facility] SR) 150 MG 12 hr tablet Take 1 tablet (150 mg total) by mouth 2 (two) times daily. 01/16/16   Cristal Ford, DO  calcitRIOL (ROCALTROL) 0.25 MCG capsule Take 9 capsules (2.25 mcg total) by mouth every Tuesday, Thursday, and Saturday at 6 PM. 04/19/20   Barb Merino, MD  cetirizine (ZYRTEC) 10 MG tablet Take 10 mg by mouth daily as needed for allergies.     [provider]  cinacalcet (SENSIPAR) 30 MG tablet Take 1 tablet (30 mg total) by mouth every Tuesday, Thursday, and Saturday at 6 PM. 04/19/20   Barb Merino, MD  ferrous sulfate 325 (65 FE) MG tablet Take 325 mg by mouth 2 (two) times daily with a meal.     [provider]  gabapentin (NEURONTIN) 300 MG capsule Take 1 capsule (300 mg total) by mouth at bedtime. 02/10/20   Aline August, MD  insulin lispro (HUMALOG) 100 UNIT/ML injection Inject 2 Units into the skin 3 (three) times daily before meals.    [provider]  latanoprost (XALATAN) 0.005 % ophthalmic solution Place 1 drop into both eyes at bedtime.    [provider]  LINZESS 72 MCG capsule Take 72 mcg by mouth daily before breakfast.  11/21/17   [provider]  metoprolol tartrate (LOPRESSOR) 25 MG tablet Take 1 tablet (25 mg total) by mouth 2 (two) times daily. 02/10/20   Aline August, MD  mometasone-formoterol (DULERA) 100-5 MCG/ACT AERO Inhale 2 puffs into the lungs every 4 (four) hours as needed for wheezing.    [provider]  nitroGLYCERIN (NITROSTAT) 0.4 MG SL tablet Place 1 tablet (0.4 mg total) under the tongue every 5 (five) minutes as needed for chest pain. 04/30/19   [name], MD  pantoprazole ([place])  40 MG tablet Take 1 tablet (40 mg total) by mouth daily. 02/28/20 04/03/20  Gladys Damme, MD  polyethylene glycol (MIRALAX) 17 g packet Take 17 g by mouth daily as needed for moderate constipation. 02/10/20   Aline August, MD  TUMS 500 MG chewable tablet Chew 2 tablets by mouth at bedtime. 01/19/20   [provider]  VELPHORO 500 MG chewable tablet Chew 2 tablets (1,000 mg total) by mouth 3 (three) times daily with meals. 02/10/20   Aline August, MD    Social History   Socioeconomic History  . Marital status: Single    Spouse name: Not on file  . Number of children: 0  . Years of education: Not on file  . Highest education level: Not on file  Occupational History  . Not on file  Tobacco Use  . Smoking status: Current Some Day Smoker    Packs/day: 0.25    Years: 50.00    Pack years: 12.50    Types: Cigarettes  . Smokeless tobacco: Never Used  Substance and Sexual Activity  . Alcohol use: No    Alcohol/week: 0.0 standard drinks  . Drug use: Yes    Types: Cocaine    Comment: pt denies at current time  . Sexual activity: Not Currently  Other Topics Concern  . Not on file  Social History Narrative   Lives with boyfriend of 20 years.    Social Determinants of Health    Financial Resource Strain:   . Difficulty of Paying Living Expenses:   Food Insecurity:   . Worried About Charity fundraiser in the Last Year:   . Arboriculturist in the Last Year:   Transportation Needs:   . Film/video editor (Medical):   Marland Kitchen Lack of Transportation (Non-Medical):   Physical Activity:   . Days of Exercise per Week:   . Minutes of Exercise per Session:   Stress:   . Feeling of Stress :   Social Connections:   . Frequency of Communication with Friends and Family:   . Frequency of Social Gatherings with Friends and Family:   . Attends Religious Services:   . Active Member of Clubs or Organizations:   . Attends Archivist Meetings:   Marland Kitchen Marital Status:   Intimate Partner Violence:   . Fear of Current or Ex-Partner:   . Emotionally Abused:   Marland Kitchen Physically Abused:   . Sexually Abused:      Family History  Problem Relation Age of Onset  . Diabetes Mother   . Hypertension Mother   . Cancer Mother   . Hyperlipidemia Father   . Hypertension Father   . Kidney disease Father   . Gout Father   . Cerebrovascular Accident Father   . Other Other        no known family CAD    ROS: [x]  Positive   [ ]  Negative   [ ]  All sytems reviewed and are negative  Cardiac: []  chest pain/pressure []  palpitations []  SOB lying flat []  DOE  Vascular: []  pain in legs while walking []  pain in legs at rest []  pain in legs at night x[]  non-healing ulcers []  hx of DVT [x]  swelling in legs  Pulmonary: []  productive cough []  asthma/wheezing []  home O2  Neurologic: []  weakness in []  arms []  legs []  numbness in []  arms []  legs []  hx of CVA []  mini stroke [] difficulty speaking or slurred speech []  temporary loss of vision in one eye []   dizziness  Hematologic: []  hx of cancer []  bleeding problems []  problems with blood clotting easily  Endocrine:   [x]  diabetes []  thyroid disease  GI []  vomiting blood []  blood in stool  GU: []  CKD/renal failure  []  HD--[]  M/W/F or []  T/T/S []  burning with urination []  blood in urine  Psychiatric: []  anxiety []  depression  Musculoskeletal: []  arthritis []  joint pain  Integumentary: []  rashes []  ulcers  Constitutional: x[]  fever [] x chills   Physical Examination  Vitals:   04/17/2020 1445 04/20/2020 1500  BP: (!) 150/47   Pulse: 70 71  Resp:    Temp:    SpO2: 100% 98%   There is no height or weight on file to calculate BMI.  General:  WDWN in NAD. Complains of pain when examing right foot Gait: Not observed HENT: WNL, normocephalic Pulmonary: normal non-labored breathing,  Cardiac: regular, without   Abdomen:  soft, NT/ND, Extremities: with ischemic changes, with Gangrene  with open wounds;  Musculoskeletal: no muscle wasting or atrophy  Neurologic: Not oriented ;  No focal weakness or paresthesias are detected; speech is fluent/normal Psychiatric:  The pt has Abnormal- confused affect.  Left lower extremity extremities are edematous.  Left first toe transmetatarsal amputation site with fibrinous exudate in the wound bed.  Doppler signal of left dorsalis pedis artery.  Ischemic changes of the right toes and medial aspect of the first head.  I am unable to obtain Doppler signals.        CBC    Component Value Date/Time   WBC 7.9 04/19/2020 1315   RBC 3.37 (L) 03/30/2020 1315   HGB 8.8 (L) 03/30/2020 1315   HCT 29.0 (L) 04/23/2020 1315   HCT 22.0 (L) 10/06/2019 0021   PLT 416 (H) 04/24/2020 1315   MCV 86.1 04/17/2020 1315   MCH 26.1 04/03/2020 1315   MCHC 30.3 04/20/2020 1315   RDW 18.9 (H) 04/12/2020 1315   LYMPHSABS 1.3 03/31/2020 1315   MONOABS 0.7 04/07/2020 1315   EOSABS 0.0 04/06/2020 1315   BASOSABS 0.1 04/24/2020 1315    BMET    Component Value Date/Time   NA 138 04/18/2020 1315   K 6.6 (HH) 04/01/2020 1315   CL 97 (L) 03/31/2020 1315   CO2 22 04/23/2020 1315   GLUCOSE 89 03/30/2020 1315   BUN 81 (H) 04/26/2020 1315   CREATININE 11.32 (H)  04/18/2020 1315   CREATININE 2.62 (H) 04/29/2016 1629   CALCIUM 8.2 (L) 04/13/2020 1315   GFRNONAA 3 (L) 04/08/2020 1315   GFRAA 4 (L) 04/15/2020 1315    COAGS: Lab Results  Component Value Date   INR 1.3 (H) 04/06/2020   INR 1.3 (H) 04/09/2020   INR 1.2 04/03/2020     ASSESSMENT/PLAN: This is a 69 y.o. female with acute on chronic lower extremity ischemia.  Presents emergency department with approximately 1 day history of altered mental status.  Low-grade fever without leukocytosis.  Nonhealing left first toe transmetatarsal amputation site and new ischemic change to the medial aspect right first metatarsal head area.  Intravenous antibiotics have been initiated.  She is being transported to inpatient hemodialysis unit. - Will likely need right lower extremity amputation, level to be determined.  Dr. Donzetta Matters is on call vascular surgeon today and will assess her clinical status   Risa Grill, PA-C [facility] [phone]  I have independently interviewed and examined patient and agree with PA assessment and plan above.  She has progressive nonhealing wound of the left  foot and will require at least further debridement.  She has a palpable left popliteal pulse to suggest previous intervention is patent.  On the right side she has known SFA disease.  Ideally would benefit from right lower extremity revascularization similar to what she is undergone on the left and will likely need right transmetatarsal amputation.  Timing is somewhat complicated by her positive Covid test.  We will make her n.p.o. past midnight discussed possible angiogram tomorrow with debridements and possible amputations on Friday.  Ayman Brull C. Donzetta Matters, MD [facility] [place] Office: [phone] Pager: [phone]

## 2020-04-25 NOTE — ED Provider Notes (Signed)
[place] EMERGENCY DEPARTMENT Provider Note   CSN: [id number] Arrival date & time: 04/06/2020  1250     History Chief Complaint  Patient presents with  . Altered Mental Status    [name] [name] is a 69 y.o. female with past medical history significant for anemia, CHF, CKD, CAD, CVA, hypertension presents to emergency department today via EMS from [facility] with chief complaint of altered mental status.  Patient is unable to provide history therefore level 5 caveat applies.  History obtained via RN from [employer] who reports increased drainage from toe over last x3 days. Low grade temp today 99.3. Not currently on any antibiotics.  She states patient also skipped dialysis yesterday, last session was x4 days ago.  She dialyzes T/Th/Sat on [address].  RN noticed that patient was hallucinating today, no history of the same.  She was saying that her husband was here visiting her and that he had just called on the phone.  Her husband has been deceased for years.   Pain medications oxycodone only has taken a few times since hospital discharge last week. Last dose 9AM today. 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  She also had revision of left arm cephalic vein AV fistula with transposition with Dr. Donzetta Matters  Patient takes daily aspirin, no other anticoagulation.   Past Medical History:  Diagnosis Date  . Abscess   . Acute blood loss anemia 08/17/2019  . Acute pulmonary edema (HCC)   . Acute respiratory failure ([place]) 10/18/2014  . Acute respiratory failure with hypoxia and hypercapnia ([place]) 06/01/2019  . AMS (altered mental status) 01/01/2020  . Anemia 08/2016  . Angiodysplasia of colon   . Arthritis of left shoulder region 03/23/2013  . Bleeding gastrointestinal   . Cardiomegaly 05/2019  . Chest pain 04/17/2016  . Chronic combined systolic and diastolic CHF (congestive heart failure) (HCC)      a. EF 40-45%, mild LVH, mid apicalanteroseptal and apical HK.  . CKD (chronic kidney disease), stage III   . Cocaine abuse ([place])    crack cocaine heavily until 2008 then sporadic use since then  . Coronary artery disease    a. 06/2012 NSTEMI/CABG x 3 (LIMA->LAD, VG->OM2, VG->LCX);  b. 04/2015 MV: EF<30%, mid ant, apicalanterior, apical infarct;  c. 04/2015 Cath: LM nl, LAD 90p, LCX 4m, OM1 min irregs, RCA mild dzs, LIMA->LAD nl w/ dist LAD dzs, VG->OM2 nl, VG->LCX nl-->Med Rx.  . CVA (cerebral infarction)    a. right internal capsule stroke in 12/2006  . Demand ischemia ([place])   . Diabetes mellitus    diagnosed in 2008  . Elevated troponin 04/27/2019  . Essential hypertension   . Glaucoma   . Gout   . Heme positive stool   . HFrEF (heart failure with reduced ejection fraction) ([place])   . Hyperlipidemia   . Hyperparathyroidism, secondary renal ([place])   . Hypertensive crisis 06/02/2019  . Left-sided sensory deficit present   . Lobar pneumonia ([place]) 04/27/2019  . Obesity, morbid ([place])   . Pneumonia   . Pulmonary edema 05/2019  . PVD (peripheral vascular disease) ([place])    a. 06/2012 ABI's: R - 0.73, L - 0.71.  Marland Kitchen Renal mass, right   . Sepsis ([place]) 04/27/2019  . Shortness of breath dyspnea   . Stroke ([place])   . Thrombocytosis ([place]) 04/17/2016  . Thyroid nodule    FNA in 2010 showed  follicular cells but not definate neoplasm  . Tobacco abuse   . Trichomoniasis     Patient Active Problem List   Diagnosis Date Noted  . Osteomyelitis ([place]) 04/10/2020  . GI bleed 04/05/2020  . AVM (arteriovenous malformation) of duodenum, acquired   . GAVE (gastric antral vascular ectasia)   . Polyp of hepatic flexure of colon   . Acute post-hemorrhagic anemia   . Dialysis patient ([place])   . Gastroesophageal reflux disease without esophagitis   . AVM (arteriovenous malformation) of colon   . Symptomatic anemia 02/21/2020  . AMS (altered mental status) 01/01/2020  . Recurrent falls 10/09/2019  .  Generalized weakness 10/09/2019  . Unspecified atrial fibrillation ([place]) 10/08/2019  . Acute GI bleeding 10/05/2019  . Hyperkalemia   . Depression   . Esophageal stricture   . Diabetic gastroparesis ([place])   . Upper GI bleed 08/16/2019  . Elevated troponin 04/27/2019  . Hiatal hernia   . Iron deficiency anemia   . Microcytic anemia 02/05/2016  . Acute on chronic renal failure ([place])   . CAD in native artery   . Substance abuse ([place])   . Chronic combined systolic and diastolic CHF (congestive heart failure) ([place])   . ESRD on dialysis ([place])   . Obesity, Class III, BMI 40-49.9 (morbid obesity) ([place])   . Type 2 diabetes mellitus with chronic kidney disease on chronic dialysis, with long-term current use of insulin ([place]) 10/18/2014  . S/P CABG (coronary artery bypass graft) 09/21/2013  . Arthritis of right shoulder region 03/23/2013  . Tobacco abuse   . Coronary artery disease   . NSTEMI (non-ST elevated myocardial infarction) ([place]) 06/29/2012  . History of cocaine abuse ([place]) 06/29/2012  . Essential hypertension 06/29/2012  . Glaucoma   . Hyperlipidemia   . History of CVA (cerebrovascular accident)     Past Surgical History:  Procedure Laterality Date  . ABDOMINAL AORTOGRAM W/LOWER EXTREMITY Bilateral 02/06/2020   Procedure: ABDOMINAL AORTOGRAM W/LOWER EXTREMITY;  Surgeon: Waynetta Sandy, MD;  Location: [place] [facility];  Service: Cardiovascular;  Laterality: Bilateral;  . AMPUTATION Left 02/07/2020   Procedure: AMPUTATION DIGIT - LEFT GREAT TOE;  Surgeon: Waynetta Sandy, MD;  Location: [place];  Service: Vascular;  Laterality: Left;  . AMPUTATION Left 04/11/2020   Procedure: Transmetatarsal Amputation left 1st Toe.;  Surgeon: Waynetta Sandy, MD;  Location: [place];  Service: Vascular;  Laterality: Left;  . AV FISTULA PLACEMENT Left 08/25/2019   Procedure: ARTERIOVENOUS (AV) BRACHIOCEPHALIC FISTULA CREATION;  Surgeon: Waynetta Sandy, MD;   Location: [place];  Service: Vascular;  Laterality: Left;  . CARDIAC CATHETERIZATION    . CARDIAC CATHETERIZATION N/A 05/17/2015   Procedure: Left Heart Cath and Cors/Grafts Angiography;  Surgeon: [name], MD;  Location: [facility];  Service: Cardiovascular;  Laterality: N/A;  . COLONOSCOPY Left 02/25/2020   Procedure: COLONOSCOPY;  Surgeon: Lavena Bullion, DO;  Location: [facility];  Service: Gastroenterology;  Laterality: Left;  . COLONOSCOPY WITH PROPOFOL N/A 04/21/2016   Procedure: COLONOSCOPY WITH PROPOFOL;  Surgeon: Irene Shipper, MD;  Location: [place];  Service: Endoscopy;  Laterality: N/A;  . CORONARY ARTERY BYPASS GRAFT  07/09/2012   Procedure: CORONARY ARTERY BYPASS GRAFTING (CABG);  Surgeon: Ivin Poot, MD;  Location: [place];  Service: Open Heart Surgery;  Laterality: N/A;  . ENTEROSCOPY N/A 10/07/2019   Procedure: ENTEROSCOPY;  Surgeon: Jackquline Denmark, MD;  Location: [facility] ENDOSCOPY;  Service: Endoscopy;  Laterality: N/A;  . ENTEROSCOPY  Left 02/25/2020   Procedure: ENTEROSCOPY;  Surgeon: Lavena Bullion, DO;  Location: [place];  Service: Gastroenterology;  Laterality: Left;  . ENTEROSCOPY N/A 04/07/2020   Procedure: ENTEROSCOPY;  Surgeon: Yetta Flock, MD;  Location: [facility] ENDOSCOPY;  Service: Gastroenterology;  Laterality: N/A;  . ESOPHAGOGASTRODUODENOSCOPY N/A 04/20/2016   Procedure: ESOPHAGOGASTRODUODENOSCOPY (EGD);  Surgeon: Gatha Mayer, MD;  Location: [facility] ENDOSCOPY;  Service: Endoscopy;  Laterality: N/A;  . ESOPHAGOGASTRODUODENOSCOPY (EGD) WITH PROPOFOL N/A 08/17/2019   Procedure: ESOPHAGOGASTRODUODENOSCOPY (EGD) WITH PROPOFOL;  Surgeon: Irene Shipper, MD;  Location: [facility] END[facility]COPY;  Service: Endoscopy;  Laterality: N/A;  . HEMOSTASIS CLIP PLACEMENT  02/25/2020   Procedure: HEMOSTASIS CLIP PLACEMENT;  Surgeon: Lavena Bullion, DO;  Location: [place];  Service: Gastroenterology;;  . HOT HEMOSTASIS N/A 02/25/2020   Procedure: HOT HEMOSTASIS  (ARGON PLASMA COAGULATION/BICAP);  Surgeon: Lavena Bullion, DO;  Location: [facility] ENDOSCOPY;  Service: Gastroenterology;  Laterality: N/A;  . HOT HEMOSTASIS N/A 04/07/2020   Procedure: HOT HEMOSTASIS (ARGON PLASMA COAGULATION/BICAP);  Surgeon: Yetta Flock, MD;  Location: [facility] ENDOSCOPY;  Service: Gastroenterology;  Laterality: N/A;  . INSERTION OF DIALYSIS CATHETER Right 08/25/2019   Procedure: INSERTION OF DIALYSIS CATHETER;  Surgeon: Waynetta Sandy, MD;  Location: [place];  Service: Vascular;  Laterality: Right;  . IR FLUORO GUIDE CV LINE RIGHT  08/19/2019  . IR US GUIDE VASC ACCESS RIGHT  08/19/2019  . LEFT HEART CATHETERIZATION WITH CORONARY ANGIOGRAM N/A 06/29/2012   Procedure: LEFT HEART CATHETERIZATION WITH CORONARY ANGIOGRAM;  Surgeon: Peter M Martinique, MD;  Location: [facility] CATH LAB;  Service: Cardiovascular;  Laterality: N/A;  . PERIPHERAL VASCULAR ATHERECTOMY Left 02/06/2020   Procedure: PERIPHERAL VASCULAR ATHERECTOMY;  Surgeon: Waynetta Sandy, MD;  Location: [place] [facility];  Service: Cardiovascular;  Laterality: Left;  SFA  . PERIPHERAL VASCULAR INTERVENTION Left 02/06/2020   Procedure: PERIPHERAL VASCULAR INTERVENTION;  Surgeon: Waynetta Sandy, MD;  Location: [place] CV [facility];  Service: Cardiovascular;  Laterality: Left;  SFA  . REVISON OF ARTERIOVENOUS FISTULA Left 02/07/2020   Procedure: REVISON OF ARTERIOVENOUS FISTULA;  Surgeon: Waynetta Sandy, MD;  Location: [place];  Service: Vascular;  Laterality: Left;  . STERNAL WOUND DEBRIDEMENT  08/17/2012   Procedure: STERNAL WOUND DEBRIDEMENT;  Surgeon: Ivin Poot, MD;  Location: [place];  Service: Thoracic;  Laterality: N/A;  wound vac application  . STERNAL WOUND DEBRIDEMENT  08/24/2012   Procedure: STERNAL WOUND DEBRIDEMENT;  Surgeon: Ivin Poot, MD;  Location: [place];  Service: Thoracic;  Laterality: N/A;  . STERNAL WOUND DEBRIDEMENT  09/01/2012   Procedure: STERNAL WOUND DEBRIDEMENT;  Surgeon:  Ivin Poot, MD;  Location: [place];  Service: Thoracic;  Laterality: N/A;  . STERNAL WOUND DEBRIDEMENT  09/20/2012   Procedure: STERNAL WOUND DEBRIDEMENT;  Surgeon: Ivin Poot, MD;  Location: [facility] OR;  Service: Thoracic;  Laterality: N/A;  wound vac change  . SUBMUCOSAL TATTOO INJECTION  10/07/2019   Procedure: SUBMUCOSAL TATTOO INJECTION;  Surgeon: Jackquline Denmark, MD;  Location: [facility] ENDOSCOPY;  Service: Endoscopy;;     OB History   No obstetric history on file.     Family History  Problem Relation Age of Onset  . Diabetes Mother   . Hypertension Mother   . Cancer Mother   . Hyperlipidemia Father   . Hypertension Father   . Kidney disease Father   . Gout Father   . Cerebrovascular Accident Father   . Other Other  no known family CAD    Social History   Tobacco Use  . Smoking status: Current Some Day Smoker    Packs/day: 0.25    Years: 50.00    Pack years: 12.50    Types: Cigarettes  . Smokeless tobacco: Never Used  Substance Use Topics  . Alcohol use: No    Alcohol/week: 0.0 standard drinks  . Drug use: Yes    Types: Cocaine    Comment: pt denies at current time    Home Medications Prior to Admission medications   Medication Sig Start Date End Date Taking? Authorizing Provider  albuterol (VENTOLIN HFA) 108 (90 Base) MCG/ACT inhaler Inhale 2 puffs into the lungs every 6 (six) hours as needed for wheezing or shortness of breath.    [provider]  allopurinol (ZYLOPRIM) 100 MG tablet Take 1 tablet (100 mg total) by mouth daily. 02/28/20   Gladys Damme, MD  aspirin 81 MG EC tablet Take 1 tablet (81 mg total) by mouth daily. 02/28/20   Gladys Damme, MD  atorvastatin (LIPITOR) 40 MG tablet Take 1 tablet (40 mg total) by mouth daily at 6 PM. 02/28/20   Gladys Damme, MD  buPROPion [facility] SR) 150 MG 12 hr tablet Take 1 tablet (150 mg total) by mouth 2 (two) times daily. 01/16/16   Cristal Ford, DO  calcitRIOL (ROCALTROL) 0.25 MCG capsule Take  9 capsules (2.25 mcg total) by mouth every Tuesday, Thursday, and Saturday at 6 PM. 04/19/20   Barb Merino, MD  cetirizine (ZYRTEC) 10 MG tablet Take 10 mg by mouth daily as needed for allergies.     [provider]  cinacalcet (SENSIPAR) 30 MG tablet Take 1 tablet (30 mg total) by mouth every Tuesday, Thursday, and Saturday at 6 PM. 04/19/20   Barb Merino, MD  ferrous sulfate 325 (65 FE) MG tablet Take 325 mg by mouth 2 (two) times daily with a meal.     [provider]  gabapentin (NEURONTIN) 300 MG capsule Take 1 capsule (300 mg total) by mouth at bedtime. 02/10/20   Aline August, MD  insulin lispro (HUMALOG) 100 UNIT/ML injection Inject 2 Units into the skin 3 (three) times daily before meals.    [provider]  latanoprost (XALATAN) 0.005 % ophthalmic solution Place 1 drop into both eyes at bedtime.    [provider]  LINZESS 72 MCG capsule Take 72 mcg by mouth daily before breakfast.  11/21/17   [provider]  metoprolol tartrate (LOPRESSOR) 25 MG tablet Take 1 tablet (25 mg total) by mouth 2 (two) times daily. 02/10/20   Aline August, MD  mometasone-formoterol (DULERA) 100-5 MCG/ACT AERO Inhale 2 puffs into the lungs every 4 (four) hours as needed for wheezing.    [provider]  nitroGLYCERIN (NITROSTAT) 0.4 MG SL tablet Place 1 tablet (0.4 mg total) under the tongue every 5 (five) minutes as needed for chest pain. 04/30/19   [name], MD  pantoprazole (PROTONIX) 40 MG tablet Take 1 tablet (40 mg total) by mouth daily. 02/28/20 04/03/20  Gladys Damme, MD  polyethylene glycol (MIRALAX) 17 g packet Take 17 g by mouth daily as needed for moderate constipation. 02/10/20   Aline August, MD  TUMS 500 MG chewable tablet Chew 2 tablets by mouth at bedtime. 01/19/20   [provider]  VELPHORO 500 MG chewable tablet Chew 2 tablets (1,000 mg total) by mouth 3 (three) times daily with meals. 02/10/20   Aline August, MD  Allergies    Naproxen  Review of Systems   Review of Systems  Unable to perform ROS: Mental status change      Physical Exam Updated Vital Signs BP (!) 150/47   Pulse 70   Temp 99.7 F (37.6 C) (Rectal)   Resp 20   SpO2 100%   Physical Exam Vitals and nursing note reviewed.  Constitutional:      General: She is not in acute distress.    Appearance: She is not ill-appearing.  HENT:     Head: Normocephalic and atraumatic.     Right Ear: Tympanic membrane and external ear normal.     Left Ear: Tympanic membrane and external ear normal.     Nose: Nose normal.     Mouth/Throat:     Mouth: Mucous membranes are moist.     Pharynx: Oropharynx is clear.  Eyes:     General: No scleral icterus.       Right eye: No discharge.        Left eye: No discharge.     Extraocular Movements: Extraocular movements intact.     Conjunctiva/sclera: Conjunctivae normal.     Pupils: Pupils are equal, round, and reactive to light.  Neck:     Vascular: No JVD.  Cardiovascular:     Rate and Rhythm: Normal rate and regular rhythm.     Pulses: Normal pulses.          Radial pulses are 2+ on the right side and 2+ on the left side.       Femoral pulses are 2+ on the right side and 2+ on the left side.    Heart sounds: Normal heart sounds.  Pulmonary:     Comments: Lungs clear to auscultation in all fields. Symmetric chest rise. No wheezing, rales, or rhonchi. Abdominal:     Comments: Abdomen is soft, non-distended, and non-tender in all quadrants. No rigidity, no guarding. No peritoneal signs.  Musculoskeletal:     Cervical back: Normal range of motion.  Feet:     Comments: Please see medial below.  Absent right sided pedal pulse with warm foot and cold dusky toes. Skin is sloughing off toes.  Left great toe amputation Skin:    General: Skin is warm and dry.     Capillary Refill: Capillary refill takes less than 2 seconds.  Neurological:     GCS: GCS eye subscore is 4. GCS verbal  subscore is 5. GCS motor subscore is 6.     Comments: Fluent speech, no facial droop.  Patient is alert and oriented to self and location only. She follow simple commands. Strong and equal grip strengths in bilateral upper extremities. Able to lift legs off the bed and press down equally.  Psychiatric:        Behavior: Behavior normal.        ED Results / Procedures / Treatments   Labs (all labs ordered are listed, but only abnormal results are displayed) Labs Reviewed  RESPIRATORY PANEL BY RT PCR (FLU A&B, COVID) - Abnormal; Notable for the following components:      Result Value   SARS Coronavirus 2 by RT PCR POSITIVE (*)    All other components within normal limits  COMPREHENSIVE METABOLIC PANEL - Abnormal; Notable for the following components:   Potassium 6.6 (*)    Chloride 97 (*)    BUN 81 (*)    Creatinine, Ser 11.32 (*)    Calcium 8.2 (*)    Albumin 2.4 (*)  GFR calc non Af Amer 3 (*)    GFR calc Af Amer 4 (*)    Anion gap 19 (*)    All other components within normal limits  CBC WITH DIFFERENTIAL/PLATELET - Abnormal; Notable for the following components:   RBC 3.37 (*)    Hemoglobin 8.8 (*)    HCT 29.0 (*)    RDW 18.9 (*)    Platelets 416 (*)    All other components within normal limits  PROTIME-INR - Abnormal; Notable for the following components:   Prothrombin Time 15.9 (*)    INR 1.3 (*)    All other components within normal limits  CULTURE, BLOOD (ROUTINE X 2)  CULTURE, BLOOD (ROUTINE X 2)  AEROBIC CULTURE (SUPERFICIAL SPECIMEN)  LACTIC ACID, PLASMA  LACTIC ACID, PLASMA  URINALYSIS, ROUTINE W REFLEX MICROSCOPIC  HEPATITIS B SURFACE ANTIGEN  CBG MONITORING, ED    EKG EKG Interpretation  Date/Time:  Wednesday April 25 2020 14:42:23 EDT Ventricular Rate:  68 PR Interval:    QRS Duration: 116 QT Interval:  431 QTC Calculation: 459 R Axis:   5 Text Interpretation: Atrial flutter with varied AV block, Probable left ventricular hypertrophy  Anterior Q waves, possibly due to LVH Abnormal T, consider ischemia, lateral leads Baseline wander in lead(s) I II aVR Confirmed by Quintella Reichert [phone]) on 04/21/2020 2:45:06 PM   Radiology DG Chest 2 View  Result Date: 04/18/2020 CLINICAL DATA:  69 year old female with suspected sepsis. Bilateral foot infection for the past 3 weeks. EXAM: CHEST - 2 VIEW COMPARISON:  Chest x-ray 04/04/2020. FINDINGS: Lung volumes are normal. No consolidative airspace disease. No pleural effusions. No evidence of frank pulmonary edema. Mild cephalization of the pulmonary vasculature. Mild cardiomegaly. Upper mediastinal contours are within normal limits. Aortic atherosclerosis. Status post median sternotomy for CABG. Right internal jugular PermCath with tip terminating in the mid to distal superior vena cava. Aortic atherosclerosis. IMPRESSION: 1. Postoperative changes and support apparatus, as above. 2. Mild cardiomegaly with pulmonary venous congestion, without frank pulmonary edema. 3. Aortic atherosclerosis. Electronically Signed   By: Vinnie Langton M.D.   On: 03/30/2020 14:52   DG Foot Complete Left  Result Date: 04/09/2020 CLINICAL DATA:  Recent amputation. Suspect sepsis. Bilateral foot infection 3 weeks. EXAM: LEFT FOOT - COMPLETE 3+ VIEW COMPARISON:  04/09/2020 FINDINGS: There is generalized decreased bone mineralization. Evidence of patient's recent amputation distal to the distal shaft of the first metatarsal. Expected postsurgical changes at the amputation site. No focal bone destruction to suggest osteomyelitis. No air within the soft tissues. Exam is otherwise unchanged. IMPRESSION: Expected changes post amputation distal to the distal aspect of the first metatarsal. No definite bone destruction to suggest osteomyelitis. No air within the soft tissues. Electronically Signed   By: Marin Olp M.D.   On: 04/18/2020 14:58   DG Foot Complete Right  Result Date: 03/29/2020 CLINICAL DATA:  Recent  amputation with possible infection three weeks. Suspect sepsis. EXAM: RIGHT FOOT COMPLETE - 3+ VIEW COMPARISON:  CT 04/12/2020 FINDINGS: Diffuse decreased bone mineralization. Degenerative change over the first MTP joint and interphalangeal joints. Mild horizontal sclerosis with subtle lucency involving the head of the fifth metatarsal possibly subacute fracture. No focal bone destruction to suggest osteomyelitis. No air within the soft tissues. IMPRESSION: 1. No definite evidence of osteomyelitis and no air within the soft tissues. 2. Linear sclerosis with subtle lucency involving the head of the fifth metatarsal which may be due to nondisplaced subacute fracture. Electronically Signed   By:  Marin Olp M.D.   On: 04/12/2020 14:56    Procedures .Critical Care Performed by: Cherre Robins, PA-C Authorized by: Cherre Robins, PA-C   Critical care provider statement:    Critical care time (minutes):  43   Critical care time was exclusive of:  Separately billable procedures and treating other patients and teaching time   Critical care was time spent personally by me on the following activities:  Blood draw for specimens, development of treatment plan with patient or surrogate, discussions with consultants, evaluation of patient's response to treatment, examination of patient, obtaining history from patient or surrogate, ordering and performing treatments and interventions, ordering and review of laboratory studies, ordering and review of radiographic studies, pulse oximetry, re-evaluation of patient's condition and review of old charts   (including critical care time)  Medications Ordered in ED Medications  piperacillin-tazobactam (ZOSYN) IVPB 3.375 g (has no administration in time range)  Chlorhexidine Gluconate Cloth 2 % PADS 6 each (has no administration in time range)  vancomycin (VANCOCIN) 2,500 mg in sodium chloride 0.9 % 500 mL IVPB (2,500 mg Intravenous New Bag/Given 04/10/2020 1734)   vancomycin variable dose per unstable renal function (pharmacist dosing) (has no administration in time range)  sucroferric oxyhydroxide (VELPHORO) chewable tablet 1,000 mg (has no administration in time range)  calcitRIOL (ROCALTROL) capsule 2.25 mcg (has no administration in time range)  cinacalcet (SENSIPAR) tablet 30 mg (has no administration in time range)    ED Course  I have reviewed the triage vital signs and the nursing notes.  Pertinent labs & imaging results that were available during my care of the patient were reviewed by me and considered in my medical decision making (see chart for details).  Clinical Course as of Apr 26 1831  Wed Apr 25, 2020  1458 [place] [facility] nephrology and case discussed with Dr. Posey Pronto. He agrees to consult and will order appropriate interventions   [KA]  1530 Consulted on-call vascular surgeon Dr. Donzetta Matters who will send PA down to evaluate patient   [KA]    Clinical Course User Index [KA] Taja Pentland, Harley Hallmark, PA-C   MDM Rules/Calculators/A&P                      History provided by [facility] staff with additional history obtained from chart review.    Patient seen and examined. This is shared ED visit with ED attending Dr. Ralene Bathe, she personally saw and evaluated the patient.   Patient presents awake, alert, hemodynamically stable, afebrile, non toxic.  Patient has rectal temp of 99.7 on arrival to the ED.  She is alert to self and location only.  On exam her lungs are clear to auscultation all fields.  No abdominal pain.  She has bilateral palpable femoral pulses.  Unable to palpate or Doppler posterior tibialis or dorsalis pedis pulse.  Right foot is warm to the touch and her toes are black and cold.  The skin is sloughing off her toes.  She has pain out of proportion on right foot. Labs results viewed.  Blood cultures ordered.  She has no leukocytosis, hemoglobin 8.8 appears consistent with baseline.  She does have hyperkalemia to 6.6 without visible  hemolysis, BUN is elevated at 81 compared to x 6 days ago when it was 57. Creatinine is also elevated at 11.32.  Anion gap of 19.  Lactic acid is within normal range.  Will order broad-spectrum antibiotics including Vanco and Zosyn. I viewed all imaging.  X-ray  of left foot without osteo-, no soft tissue gas seen.   X-ray of right foot does not show osteo-, no air in soft tissue.  There is a linear lucency involving the head of the fifth metatarsal suggestive of nondisplaced subacute fracture. Chest xray shows mild cardiomegaly with pulmonary venous congestion. EKG without ischemia. Patient is covid positive.  Consulted nephrology Dr. Posey Pronto who will follow along and order interventions for hyperkalemia.  Please see his consult note. Consulted vascular surgery Dr. Donzetta Matters and vascular PA evaluated patient at the bedside. Please see consult note. Vascular surgery is recommending medical admission.  Spoke with Dr. Veverly Fells with hospitalist service who agrees to assume care of patient and bring into the hospital for further evaluation and management.    [name] [name] was evaluated in Emergency Department on 04/05/2020 for the symptoms described in the history of present illness. She was evaluated in the context of the global COVID-19 pandemic, which necessitated consideration that the patient might be at risk for infection with the SARS-CoV-2 virus that causes COVID-19. Institutional protocols and algorithms that pertain to the evaluation of patients at risk for COVID-19 are in a state of rapid change based on information released by regulatory bodies including the CDC and federal and state organizations. These policies and algorithms were followed during the patient's care in the ED.  Portions of this note were generated with [employer]. Dictation errors may occur despite best attempts at proofreading.  Final Clinical Impression(s) / ED Diagnoses Final diagnoses:  Hyperkalemia  Gangrene ([place])    COVID-19 virus infection    Rx / DC Orders ED Discharge Orders    None       Flint Melter 04/09/2020 1832    Quintella Reichert, MD 04/27/20 [phone]

## 2020-04-25 NOTE — ED Notes (Signed)
Pt CBG resulted extremely low and standing orders were consulted for this pt as pt refused oral carb intake to increase CBG

## 2020-04-25 NOTE — Procedures (Signed)
Patient seen on Hemodialysis. BP (!) 129/58 (BP Location: Right Arm)   Pulse 70   Temp 99.6 F (37.6 C) (Oral)   Resp 20   Wt 122 kg   SpO2 98%   BMI 40.91 kg/m   QB 400, UF goal 2.5L Tolerating treatment without complaints at this time.   Elmarie Shiley MD [facility]. Office # [phone] Pager # [phone] 4:58 PM

## 2020-04-25 NOTE — ED Notes (Signed)
Pt transported to xray 

## 2020-04-25 NOTE — Progress Notes (Addendum)
[name] Renal Consultation Note  I have personally seen and examined this patient and agree with the assessment/plan as outlined below by Marisa Sprinkles PA Student.  Patient here for evaluation of encephalopathy and increased drainage from bilateral foot ulcers and having missed dialysis. Will undertake hemodialysis as management of leg wounds is supervised by primary service +/- vascular surgery.   [name],MD 03/29/2020 4:56 PM     Indication for Consultation:  Management of ESRD/hemodialysis; anemia, hypertension/volume and secondary hyperparathyroidism  VVO:[facility]  HPI: [name] [name] is a 69 y.o. female. 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 Past medical history significant for CAD (Hx CABG), CHF, PVD s/p L great toe amp, CVA.  Recently admitted 04/03/20 for anemia, fever, tachycardia from HD requiring multiple pRBCs x 4. 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 Also found to have OM of L foot - underwent L great toe amp 4/14. She was to follow up with Dr. Donzetta Matters for RLE angiogram +/- surgery after 4/22 discharge. Poor prognosis for both feet. Today [name] returns to the ED from SNF [facility]) with AMS/hallucinations and drainage from foot wounds. Missed both HD treatments since discharge - confirmed with [facility].   Seen and examined at bedside in ED. Appears confused and uncomfortable noting pain in feet. Denies headache, chest pain, shortness of breath, abdominal pain, nausea, or vomiting.   Labs: K+ 6.6 CO2 22 BUN 81 Cr 11.32  WBC 7.9 Hgb 8.8. CXR w/ pulmonary venous congestion - w/o frank pulmonary edema. Low-grade fever. VSS.   Past Medical History:  Diagnosis Date  . Abscess   . Acute blood loss anemia 08/17/2019  . Acute pulmonary edema (HCC)   . Acute respiratory failure ([place]) 10/18/2014  . Acute respiratory failure with hypoxia and hypercapnia ([place]) 06/01/2019  . AMS (altered mental status)  01/01/2020  . Anemia 08/2016  . Angiodysplasia of colon   . Arthritis of left shoulder region 03/23/2013  . Bleeding gastrointestinal   . Cardiomegaly 05/2019  . Chest pain 04/17/2016  . Chronic combined systolic and diastolic CHF (congestive heart failure) (HCC)    a. EF 40-45%, mild LVH, mid apicalanteroseptal and apical HK.  . CKD (chronic kidney disease), stage III   . Cocaine abuse ([place])    crack cocaine heavily until 2008 then sporadic use since then  . Coronary artery disease    a. 06/2012 NSTEMI/CABG x 3 (LIMA->LAD, VG->OM2, VG->LCX);  b. 04/2015 MV: EF<30%, mid ant, apicalanterior, apical infarct;  c. 04/2015 Cath: LM nl, LAD 90p, LCX 84m, OM1 min irregs, RCA mild dzs, LIMA->LAD nl w/ dist LAD dzs, VG->OM2 nl, VG->LCX nl-->Med Rx.  . CVA (cerebral infarction)    a. right internal capsule stroke in 12/2006  . Demand ischemia ([place])   . Diabetes mellitus    diagnosed in 2008  . Elevated troponin 04/27/2019  . Essential hypertension   . Glaucoma   . Gout   . Heme positive stool   . HFrEF (heart failure with reduced ejection fraction) ([place])   . Hyperlipidemia   . Hyperparathyroidism, secondary renal ([place])   . Hypertensive crisis 06/02/2019  . Left-sided sensory deficit present   . Lobar pneumonia ([place]) 04/27/2019  . Obesity, morbid ([place])   . Pneumonia   . Pulmonary edema 05/2019  . PVD (peripheral vascular disease) ([place])    a. 06/2012 ABI's: R - 0.73, L - 0.71.  Marland Kitchen Renal mass, right   .  Sepsis ([place]) 04/27/2019  . Shortness of breath dyspnea   . Stroke ([place])   . Thrombocytosis ([place]) 04/17/2016  . Thyroid nodule    FNA in 7782 showed follicular cells but not definate neoplasm  . Tobacco abuse   . Trichomoniasis    Past Surgical History:  Procedure Laterality Date  . ABDOMINAL AORTOGRAM W/LOWER EXTREMITY Bilateral 02/06/2020   Procedure: ABDOMINAL AORTOGRAM W/LOWER EXTREMITY;  Surgeon: Waynetta Sandy, MD;  Location: [place] CV LAB;  Service: Cardiovascular;  Laterality:  Bilateral;  . AMPUTATION Left 02/07/2020   Procedure: AMPUTATION DIGIT - LEFT GREAT TOE;  Surgeon: Waynetta Sandy, MD;  Location: [place];  Service: Vascular;  Laterality: Left;  . AMPUTATION Left 04/11/2020   Procedure: Transmetatarsal Amputation left 1st Toe.;  Surgeon: Waynetta Sandy, MD;  Location: [place];  Service: Vascular;  Laterality: Left;  . AV FISTULA PLACEMENT Left 08/25/2019   Procedure: ARTERIOVENOUS (AV) BRACHIOCEPHALIC FISTULA CREATION;  Surgeon: Waynetta Sandy, MD;  Location: [place];  Service: Vascular;  Laterality: Left;  . CARDIAC CATHETERIZATION    . CARDIAC CATHETERIZATION N/A 05/17/2015   Procedure: Left Heart Cath and Cors/Grafts Angiography;  Surgeon: [name], MD;  Location: [place] CV LAB;  Service: Cardiovascular;  Laterality: N/A;  . COLONOSCOPY Left 02/25/2020   Procedure: COLONOSCOPY;  Surgeon: Lavena Bullion, DO;  Location: [place];  Service: Gastroenterology;  Laterality: Left;  . COLONOSCOPY WITH PROPOFOL N/A 04/21/2016   Procedure: COLONOSCOPY WITH PROPOFOL;  Surgeon: Irene Shipper, MD;  Location: [place];  Service: Endoscopy;  Laterality: N/A;  . CORONARY ARTERY BYPASS GRAFT  07/09/2012   Procedure: CORONARY ARTERY BYPASS GRAFTING (CABG);  Surgeon: Ivin Poot, MD;  Location: [place];  Service: Open Heart Surgery;  Laterality: N/A;  . ENTEROSCOPY N/A 10/07/2019   Procedure: ENTEROSCOPY;  Surgeon: Jackquline Denmark, MD;  Location: [facility] ENDOSCOPY;  Service: Endoscopy;  Laterality: N/A;  . ENTEROSCOPY Left 02/25/2020   Procedure: ENTEROSCOPY;  Surgeon: Lavena Bullion, DO;  Location: [place];  Service: Gastroenterology;  Laterality: Left;  . ENTEROSCOPY N/A 04/07/2020   Procedure: ENTEROSCOPY;  Surgeon: Yetta Flock, MD;  Location: [facility] ENDOSCOPY;  Service: Gastroenterology;  Laterality: N/A;  . ESOPHAGOGASTRODUODENOSCOPY N/A 04/20/2016   Procedure: ESOPHAGOGASTRODUODENOSCOPY (EGD);  Surgeon: Gatha Mayer, MD;   Location: [facility] ENDOSCOPY;  Service: Endoscopy;  Laterality: N/A;  . ESOPHAGOGASTRODUODENOSCOPY (EGD) WITH PROPOFOL N/A 08/17/2019   Procedure: ESOPHAGOGASTRODUODENOSCOPY (EGD) WITH PROPOFOL;  Surgeon: Irene Shipper, MD;  Location: [facility] ENDOSCOPY;  Service: Endoscopy;  Laterality: N/A;  . HEMOSTASIS CLIP PLACEMENT  02/25/2020   Procedure: HEMOSTASIS CLIP PLACEMENT;  Surgeon: Lavena Bullion, DO;  Location: [place];  Service: Gastroenterology;;  . HOT HEMOSTASIS N/A 02/25/2020   Procedure: HOT HEMOSTASIS (ARGON PLASMA COAGULATION/BICAP);  Surgeon: Lavena Bullion, DO;  Location: [facility] ENDOSCOPY;  Service: Gastroenterology;  Laterality: N/A;  . HOT HEMOSTASIS N/A 04/07/2020   Procedure: HOT HEMOSTASIS (ARGON PLASMA COAGULATION/BICAP);  Surgeon: Yetta Flock, MD;  Location: [facility] ENDOSCOPY;  Service: Gastroenterology;  Laterality: N/A;  . INSERTION OF DIALYSIS CATHETER Right 08/25/2019   Procedure: INSERTION OF DIALYSIS CATHETER;  Surgeon: Waynetta Sandy, MD;  Location: [place];  Service: Vascular;  Laterality: Right;  . IR FLUORO GUIDE CV LINE RIGHT  08/19/2019  . IR US GUIDE VASC ACCESS RIGHT  08/19/2019  . LEFT HEART CATHETERIZATION WITH CORONARY ANGIOGRAM N/A 06/29/2012   Procedure: LEFT HEART CATHETERIZATION WITH CORONARY ANGIOGRAM;  Surgeon: Peter M Martinique, MD;  Location: [place] CATH LAB;  Service: Cardiovascular;  Laterality: N/A;  . PERIPHERAL VASCULAR ATHERECTOMY Left 02/06/2020   Procedure: PERIPHERAL VASCULAR ATHERECTOMY;  Surgeon: Waynetta Sandy, MD;  Location: [place] CV LAB;  Service: Cardiovascular;  Laterality: Left;  SFA  . PERIPHERAL VASCULAR INTERVENTION Left 02/06/2020   Procedure: PERIPHERAL VASCULAR INTERVENTION;  Surgeon: Waynetta Sandy, MD;  Location: [place] CV LAB;  Service: Cardiovascular;  Laterality: Left;  SFA  . REVISON OF ARTERIOVENOUS FISTULA Left 02/07/2020   Procedure: REVISON OF ARTERIOVENOUS FISTULA;  Surgeon: Waynetta Sandy, MD;  Location: [place];  Service: Vascular;  Laterality: Left;  . STERNAL WOUND DEBRIDEMENT  08/17/2012   Procedure: STERNAL WOUND DEBRIDEMENT;  Surgeon: Ivin Poot, MD;  Location: [place];  Service: Thoracic;  Laterality: N/A;  wound vac application  . STERNAL WOUND DEBRIDEMENT  08/24/2012   Procedure: STERNAL WOUND DEBRIDEMENT;  Surgeon: Ivin Poot, MD;  Location: [place];  Service: Thoracic;  Laterality: N/A;  . STERNAL WOUND DEBRIDEMENT  09/01/2012   Procedure: STERNAL WOUND DEBRIDEMENT;  Surgeon: Ivin Poot, MD;  Location: [place];  Service: Thoracic;  Laterality: N/A;  . STERNAL WOUND DEBRIDEMENT  09/20/2012   Procedure: STERNAL WOUND DEBRIDEMENT;  Surgeon: Ivin Poot, MD;  Location: [facility] OR;  Service: Thoracic;  Laterality: N/A;  wound vac change  . SUBMUCOSAL TATTOO INJECTION  10/07/2019   Procedure: SUBMUCOSAL TATTOO INJECTION;  Surgeon: Jackquline Denmark, MD;  Location: [facility] ENDOSCOPY;  Service: Endoscopy;;   Family History  Problem Relation Age of Onset  . Diabetes Mother   . Hypertension Mother   . Cancer Mother   . Hyperlipidemia Father   . Hypertension Father   . Kidney disease Father   . Gout Father   . Cerebrovascular Accident Father   . Other Other        no known family CAD   Social History:  reports that she has been smoking cigarettes. She has a 12.50 pack-year smoking history. She has never used smokeless tobacco. She reports current drug use. Drug: Cocaine. She reports that she does not drink alcohol. Allergies  Allergen Reactions  . Naproxen Rash   Prior to Admission medications   Medication Sig Start Date End Date Taking? Authorizing Provider  albuterol (VENTOLIN HFA) 108 (90 Base) MCG/ACT inhaler Inhale 2 puffs into the lungs every 6 (six) hours as needed for wheezing or shortness of breath.    [provider]  allopurinol (ZYLOPRIM) 100 MG tablet Take 1 tablet (100 mg total) by mouth daily. 02/28/20   Gladys Damme, MD  aspirin 81 MG  EC tablet Take 1 tablet (81 mg total) by mouth daily. 02/28/20   Gladys Damme, MD  atorvastatin (LIPITOR) 40 MG tablet Take 1 tablet (40 mg total) by mouth daily at 6 PM. 02/28/20   Gladys Damme, MD  buPROPion [facility] SR) 150 MG 12 hr tablet Take 1 tablet (150 mg total) by mouth 2 (two) times daily. 01/16/16   Cristal Ford, DO  calcitRIOL (ROCALTROL) 0.25 MCG capsule Take 9 capsules (2.25 mcg total) by mouth every Tuesday, Thursday, and Saturday at 6 PM. 04/19/20   Barb Merino, MD  cetirizine (ZYRTEC) 10 MG tablet Take 10 mg by mouth daily as needed for allergies.     [provider]  cinacalcet (SENSIPAR) 30 MG tablet Take 1 tablet (30 mg total) by mouth every Tuesday, Thursday, and Saturday at 6 PM. 04/19/20   Barb Merino, MD  ferrous sulfate 325 (65  FE) MG tablet Take 325 mg by mouth 2 (two) times daily with a meal.     [provider]  gabapentin (NEURONTIN) 300 MG capsule Take 1 capsule (300 mg total) by mouth at bedtime. 02/10/20   Aline August, MD  insulin lispro (HUMALOG) 100 UNIT/ML injection Inject 2 Units into the skin 3 (three) times daily before meals.    [provider]  latanoprost (XALATAN) 0.005 % ophthalmic solution Place 1 drop into both eyes at bedtime.    [provider]  LINZESS 72 MCG capsule Take 72 mcg by mouth daily before breakfast.  11/21/17   [provider]  metoprolol tartrate (LOPRESSOR) 25 MG tablet Take 1 tablet (25 mg total) by mouth 2 (two) times daily. 02/10/20   Aline August, MD  mometasone-formoterol (DULERA) 100-5 MCG/ACT AERO Inhale 2 puffs into the lungs every 4 (four) hours as needed for wheezing.    [provider]  nitroGLYCERIN (NITROSTAT) 0.4 MG SL tablet Place 1 tablet (0.4 mg total) under the tongue every 5 (five) minutes as needed for chest pain. 04/30/19   [name], MD  pantoprazole (PROTONIX) 40 MG tablet Take 1 tablet (40 mg total) by mouth daily. 02/28/20 04/03/20  Gladys Damme, MD   polyethylene glycol (MIRALAX) 17 g packet Take 17 g by mouth daily as needed for moderate constipation. 02/10/20   Aline August, MD  TUMS 500 MG chewable tablet Chew 2 tablets by mouth at bedtime. 01/19/20   [provider]  VELPHORO 500 MG chewable tablet Chew 2 tablets (1,000 mg total) by mouth 3 (three) times daily with meals. 02/10/20   Aline August, MD   Current Facility-Administered Medications  Medication Dose Route Frequency Provider Last Rate Last Admin  . [START ON 04/26/2020] Chlorhexidine Gluconate Cloth 2 % PADS 6 each  6 each Topical Q0600 Loren Racer, PA-C      . piperacillin-tazobactam (ZOSYN) IVPB 3.375 g  3.375 g Intravenous Once Albrizze, Kaitlyn E, PA-C      . vancomycin (VANCOCIN) 2,500 mg in sodium chloride 0.9 % 500 mL IVPB  2,500 mg Intravenous Once Richardine Service, RPH      . vancomycin variable dose per unstable renal function (pharmacist dosing)   Does not apply See admin instructions Richardine Service, [facility]       Current Outpatient Medications  Medication Sig Dispense Refill  . albuterol (VENTOLIN HFA) 108 (90 Base) MCG/ACT inhaler Inhale 2 puffs into the lungs every 6 (six) hours as needed for wheezing or shortness of breath.    . allopurinol (ZYLOPRIM) 100 MG tablet Take 1 tablet (100 mg total) by mouth daily. 30 tablet 0  . aspirin 81 MG EC tablet Take 1 tablet (81 mg total) by mouth daily. 30 tablet 0  . atorvastatin (LIPITOR) 40 MG tablet Take 1 tablet (40 mg total) by mouth daily at 6 PM. 30 tablet 0  . buPROPion (WELLBUTRIN SR) 150 MG 12 hr tablet Take 1 tablet (150 mg total) by mouth 2 (two) times daily. 60 tablet 0  . calcitRIOL (ROCALTROL) 0.25 MCG capsule Take 9 capsules (2.25 mcg total) by mouth every Tuesday, Thursday, and Saturday at 6 PM.    . cetirizine (ZYRTEC) 10 MG tablet Take 10 mg by mouth daily as needed for allergies.     . cinacalcet (SENSIPAR) 30 MG tablet Take 1 tablet (30 mg total) by mouth every Tuesday, Thursday, and Saturday at  6 PM. 60 tablet   . ferrous sulfate 325 (65 FE)  MG tablet Take 325 mg by mouth 2 (two) times daily with a meal.     . gabapentin (NEURONTIN) 300 MG capsule Take 1 capsule (300 mg total) by mouth at bedtime.    . insulin lispro (HUMALOG) 100 UNIT/ML injection Inject 2 Units into the skin 3 (three) times daily before meals.    . latanoprost (XALATAN) 0.005 % ophthalmic solution Place 1 drop into both eyes at bedtime.    Marland Kitchen LINZESS 72 MCG capsule Take 72 mcg by mouth daily before breakfast.   3  . metoprolol tartrate (LOPRESSOR) 25 MG tablet Take 1 tablet (25 mg total) by mouth 2 (two) times daily.    . mometasone-formoterol (DULERA) 100-5 MCG/ACT AERO Inhale 2 puffs into the lungs every 4 (four) hours as needed for wheezing.    . nitroGLYCERIN (NITROSTAT) 0.4 MG SL tablet Place 1 tablet (0.4 mg total) under the tongue every 5 (five) minutes as needed for chest pain. 30 tablet 0  . pantoprazole (PROTONIX) 40 MG tablet Take 1 tablet (40 mg total) by mouth daily. 30 tablet 0  . polyethylene glycol (MIRALAX) 17 g packet Take 17 g by mouth daily as needed for moderate constipation. 14 each 0  . TUMS 500 MG chewable tablet Chew 2 tablets by mouth at bedtime.    . VELPHORO 500 MG chewable tablet Chew 2 tablets (1,000 mg total) by mouth 3 (three) times daily with meals. 90 tablet 0   Labs: Basic Metabolic Panel: Recent Labs  Lab 04/19/20 0731 04/16/2020 1315  NA 137 138  K 4.4 6.6*  CL 99 97*  CO2 24 22  GLUCOSE 181* 89  BUN 57* 81*  CREATININE 7.10* 11.32*  CALCIUM 8.3* 8.2*  PHOS 4.0  --    Liver Function Tests: Recent Labs  Lab 04/19/20 0731 04/26/2020 1315  AST  --  21  ALT  --  7  ALKPHOS  --  102  BILITOT  --  0.7  PROT  --  6.8  ALBUMIN 2.1* 2.4*   No results for input(s): LIPASE, AMYLASE in the last 168 hours. No results for input(s): AMMONIA in the last 168 hours. CBC: Recent Labs  Lab 04/19/20 0208 04/24/2020 1315  WBC 10.4 7.9  NEUTROABS  --  5.8  HGB 8.8* 8.8*  HCT  29.2* 29.0*  MCV 87.7 86.1  PLT 419* 416*   Cardiac Enzymes: No results for input(s): CKTOTAL, CKMB, CKMBINDEX, TROPONINI in the last 168 hours. CBG: Recent Labs  Lab 04/18/20 2010 04/19/20 0520 04/19/20 1204 04/19/20 1606 04/10/2020 1310  GLUCAP 144* 183* 101* 160* 76   Iron Studies: No results for input(s): IRON, TIBC, TRANSFERRIN, FERRITIN in the last 72 hours. Studies/Results: DG Chest 2 View  Result Date: 04/26/2020 CLINICAL DATA:  69 year old female with suspected sepsis. Bilateral foot infection for the past 3 weeks. EXAM: CHEST - 2 VIEW COMPARISON:  Chest x-ray 04/04/2020. FINDINGS: Lung volumes are normal. No consolidative airspace disease. No pleural effusions. No evidence of frank pulmonary edema. Mild cephalization of the pulmonary vasculature. Mild cardiomegaly. Upper mediastinal contours are within normal limits. Aortic atherosclerosis. Status post median sternotomy for CABG. Right internal jugular PermCath with tip terminating in the mid to distal superior vena cava. Aortic atherosclerosis. IMPRESSION: 1. Postoperative changes and support apparatus, as above. 2. Mild cardiomegaly with pulmonary venous congestion, without frank pulmonary edema. 3. Aortic atherosclerosis. Electronically Signed   By: Vinnie Langton M.D.   On: 04/16/2020 14:52   DG Foot Complete Left  Result  Date: 04/04/2020 CLINICAL DATA:  Recent amputation. Suspect sepsis. Bilateral foot infection 3 weeks. EXAM: LEFT FOOT - COMPLETE 3+ VIEW COMPARISON:  04/09/2020 FINDINGS: There is generalized decreased bone mineralization. Evidence of patient's recent amputation distal to the distal shaft of the first metatarsal. Expected postsurgical changes at the amputation site. No focal bone destruction to suggest osteomyelitis. No air within the soft tissues. Exam is otherwise unchanged. IMPRESSION: Expected changes post amputation distal to the distal aspect of the first metatarsal. No definite bone destruction to  suggest osteomyelitis. No air within the soft tissues. Electronically Signed   By: Marin Olp M.D.   On: 04/20/2020 14:58   DG Foot Complete Right  Result Date: 04/11/2020 CLINICAL DATA:  Recent amputation with possible infection three weeks. Suspect sepsis. EXAM: RIGHT FOOT COMPLETE - 3+ VIEW COMPARISON:  CT 04/12/2020 FINDINGS: Diffuse decreased bone mineralization. Degenerative change over the first MTP joint and interphalangeal joints. Mild horizontal sclerosis with subtle lucency involving the head of the fifth metatarsal possibly subacute fracture. No focal bone destruction to suggest osteomyelitis. No air within the soft tissues. IMPRESSION: 1. No definite evidence of osteomyelitis and no air within the soft tissues. 2. Linear sclerosis with subtle lucency involving the head of the fifth metatarsal which may be due to nondisplaced subacute fracture. Electronically Signed   By: Marin Olp M.D.   On: 04/24/2020 14:56    ROS: All others negative except those listed in HPI.  Physical Exam: Vitals:   04/22/2020 1315 04/12/2020 1330 04/15/2020 1445 04/06/2020 1500  BP: (!) 106/44 115/83 (!) 150/47   Pulse: 75 76 70 71  Resp:      Temp:      TempSrc:      SpO2: 96% 95% 100% 98%     General: Obese female lying in hospital bed with eyes closed.  Head: NCAT sclera not icteric MMM. Lungs: CTA bilaterally. No wheeze, rales or rhonchi. Breathing is unlabored. Heart: RRR. No murmur, rubs or gallops.  Abdomen: Obese, soft, non-tender. Lower extremities: 1+ edema in BL LE. R toes with discoloration, gangrene, and ulceration. L great toe amputation - packed with gauze.  Neuro: Slightly altered. Oriented to person and ?place. Moves all extremities spontaneously. Psych: Mood and affect congruent. Poor attention and insight.  Dialysis Access: LUE AVF   Dialysis Orders:  TTS - [facility]  3.5 hrs, 400/800,  EDW 118, 1 K x 1hr then 2K/ 2.5Ca, LUE AVF, no Heparin  - Mircera 225 mcg q2wks - last 03/29/20,  Aranesp 200 mcg 04/13/20 - due 04/27/20  - Calcitriol 2.25 mcg PO qHD  - Cinacalcet 30 mg TTS at 6 PM  Home meds: albuterol inhaler prn, allopurinol 100 mg QD, ASA 81 mg QD, Lipitor 40 mg QD, Wellbutrin 150 mg BID, cetirizine 10 mg QD, ferrous sulfate 325 mg BID, gabapentin 300 mg QHS, Humalog 2U TID, Linzess 72 mcg QD, Lopressor 25 mg BID, Dulera inhaler prn, NG prn, pantoprazole 40 mg QD, Miralax prn, Tums 500 mg x 2 QHS  Last Labs: Hgb 8.8, TSAT 55%, K 6.6, Ca 8.2, P 4, Alb 2.4  Assessment/Plan: 1.  Hyperkalemia: 6.6 on admission. Missed HD x 2. EKG reviewed, no evidence of hyperkalemic changes. Orders placed for inpatient HD today.   2.  OM of L foot, R foot gangrene/cellulitis: Started on vanc/pip-tazo by pharmacy after wound cultures of R toe. Will require further management/wound care. Vascular consulted. BCx ordered.   3.  ESRD - Last treatment 04/19/20 in the hospital. Refused  HD treatments Thurs/Sat treatments per SNF/[facility].   4.  Hypertension/volume  - CXR with some pulmonary congestion, no frank pulmonary edema. VSS. Some lower extremity edema on exam.   5.  Anemia of CKD - Hgb 8.8. Required pRBCs x 4 last hospitalization. Identified duodenal AVM as bleeding source in addition to anemia of chronic disease. On Mircera outpatient - lat 03/29/20. Aranesp 04/13/20 in hospital. Due Friday for Aranesp 200 mcg, 4/30. No IV iron in the presence of infection.  6.  Secondary Hyperparathyroidism -  Continue cinacalcet 30 PO TIW, Velphoro 1000mg  TID, and calcitriol 2.25 mcg PO qHD.  7.  Nutrition - Renal diet with fluid restriction.    Marisa Sprinkles, PA-S [facility]

## 2020-04-25 NOTE — ED Notes (Signed)
Pts. Temp was 102.4 orally rechecked after tylenol. CBG was also 77 after D5 given. MD on-call paged and notified of both of these.

## 2020-04-25 NOTE — ED Notes (Signed)
Pt adamantly refusing to allow this RN to collect covid swab. AC made aware.

## 2020-04-25 NOTE — Progress Notes (Signed)
Pharmacy Antibiotic Note  [name] [name] is a 69 y.o. female admitted on 04/01/2020 with cellulitis.  Pharmacy has been consulted for Vancomycin dosing. Patient is afebrile, WBC wnl. Pt has stage III CKD and gets HD TTSa PTA - skipped dialysis yesterday.   Plan: Vancomycin 2500 mg IV once Plan to give vancomycin 1000mg  IV with every HD session - will enter doses as HD is scheduled F/u HD plans Monitor HD sessions, cultures/sensitivities, and clinical progression     Temp (24hrs), Avg:99.7 F (37.6 C), Min:99.6 F (37.6 C), Max:99.7 F (37.6 C)  Recent Labs  Lab 04/19/20 0208 04/19/20 0731 04/01/2020 1315  WBC 10.4  --  7.9  CREATININE  --  7.10* 11.32*  LATICACIDVEN  --   --  1.5    Estimated Creatinine Clearance: 6.3 mL/min (A) (by C-G formula based on SCr of 11.32 mg/dL (H)).    Allergies  Allergen Reactions  . Naproxen Rash    Antimicrobials this admission: Vancomycin 4/28 >>  Zosyn 4/28 x1   Dose adjustments this admission: N/A  Microbiology results: 4/28 BCx:   Richardine Service, PharmD PGY1 Pharmacy Resident Phone: [phone] 04/09/2020  3:44 PM  Please check [email or web address] for unit-specific pharmacy phone numbers.

## 2020-04-25 NOTE — ED Notes (Signed)
[name] [name]- [phone] (home)  [phone] (cell)

## 2020-04-25 NOTE — ED Notes (Signed)
Unable to doppler pedal pulse on R foot. Good femoral pulse palpated.

## 2020-04-25 NOTE — H&P (Addendum)
History and Physical    [name] [name] VVO:[id number] DOB: 1951-06-18 DOA: 04/26/2020  PCP: [facility] (Confirm with patient/family/NH records and if not entered, this has to be entered at [facility] point of entry) Patient coming from: SNF  I have personally briefly reviewed patient's old medical records in [place]  Chief Complaint: Fever, decreased level of consciousness  HPI: [name] [name] is a 69 y.o. female with medical history significant of anemia, CHF, CKD, CAD, CVA, hypertension presents to emergency department today via EMS from [facility] with chief complaint of altered mental status.  Patient is unable to provide history therefore level 5 caveat applies.  History obtained via RN from [employer] who reports increased drainage from toe over last x3 days. Low grade temp today 99.3. Not currently on any antibiotics.  She states patient also skipped dialysis yesterday, last session was x4 days ago.  She dialyzes T/Th/Sat on [address].  RN noticed that patient was hallucinating today, no history of the same.  She was saying that her husband was here visiting her and that he had just called on the phone.  Her husband has been deceased for years.   Pain medications oxycodone only has taken a few times since hospital discharge last week. Last dose 9AM today. 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  She also had revision of left arm cephalic vein AV fistula with transposition with Dr. Donzetta Matters  Patient takes daily aspirin, no other anticoagulation.  Due to fever, mental status change, missed HD and increased drainage from her amputation site she is transported to [facility] ED for further evaluation and treatment    ED Course: Patient febrile to 997. She is confused and uncomfortable. Lab revealed K = 6.6 with high Cr. WBC normal, Hgb 8.8u in setting of chronic anemia. CXR with CM but  no pulmonary edema. Patient with open surgical site after left Great toe amputation with fibrinous base and some drainage. Right foot with dry gangrene, large ulceration at medial MTP - see photo documentation. Dr. Posey Pronto for nephrology was consulted and patient is taken for emergent HD. Vascular surgery consult done with recommendation for admission, IV abx and probable distal R LE amputation. TRH is called to admit the patient  Review of Systems: As per HPI otherwise 10 point review of systems negative. CAveat - patient somnolent and a poor historian.   Past Medical History:  Diagnosis Date  . Abscess   . Acute blood loss anemia 08/17/2019  . Acute pulmonary edema (HCC)   . Acute respiratory failure ([place]) 10/18/2014  . Acute respiratory failure with hypoxia and hypercapnia ([place]) 06/01/2019  . AMS (altered mental status) 01/01/2020  . Anemia 08/2016  . Angiodysplasia of colon   . Arthritis of left shoulder region 03/23/2013  . Bleeding gastrointestinal   . Cardiomegaly 05/2019  . Chest pain 04/17/2016  . Chronic combined systolic and diastolic CHF (congestive heart failure) (HCC)    a. EF 40-45%, mild LVH, mid apicalanteroseptal and apical HK.  . CKD (chronic kidney disease), stage III   . Cocaine abuse ([place])    crack cocaine heavily until 2008 then sporadic use since then  . Coronary artery disease    a. 06/2012 NSTEMI/CABG x 3 (LIMA->LAD, VG->OM2, VG->LCX);  b. 04/2015 MV: EF<30%, mid ant, apicalanterior, apical infarct;  c. 04/2015 Cath: LM nl, LAD 90p, LCX 27m, OM1 min irregs, RCA  mild dzs, LIMA->LAD nl w/ dist LAD dzs, VG->OM2 nl, VG->LCX nl-->Med Rx.  . CVA (cerebral infarction)    a. right internal capsule stroke in 12/2006  . Demand ischemia ([place])   . Diabetes mellitus    diagnosed in 2008  . Elevated troponin 04/27/2019  . Essential hypertension   . Glaucoma   . Gout   . Heme positive stool   . HFrEF (heart failure with reduced ejection fraction) ([place])   . Hyperlipidemia   .  Hyperparathyroidism, secondary renal ([place])   . Hypertensive crisis 06/02/2019  . Left-sided sensory deficit present   . Lobar pneumonia ([place]) 04/27/2019  . Obesity, morbid ([place])   . Pneumonia   . Pulmonary edema 05/2019  . PVD (peripheral vascular disease) ([place])    a. 06/2012 ABI's: R - 0.73, L - 0.71.  Marland Kitchen Renal mass, right   . Sepsis ([place]) 04/27/2019  . Shortness of breath dyspnea   . Stroke ([place])   . Thrombocytosis ([place]) 04/17/2016  . Thyroid nodule    FNA in 1610 showed follicular cells but not definate neoplasm  . Tobacco abuse   . Trichomoniasis     Past Surgical History:  Procedure Laterality Date  . ABDOMINAL AORTOGRAM W/LOWER EXTREMITY Bilateral 02/06/2020   Procedure: ABDOMINAL AORTOGRAM W/LOWER EXTREMITY;  Surgeon: Waynetta Sandy, MD;  Location: [place] [facility];  Service: Cardiovascular;  Laterality: Bilateral;  . AMPUTATION Left 02/07/2020   Procedure: AMPUTATION DIGIT - LEFT GREAT TOE;  Surgeon: Waynetta Sandy, MD;  Location: [place];  Service: Vascular;  Laterality: Left;  . AMPUTATION Left 04/11/2020   Procedure: Transmetatarsal Amputation left 1st Toe.;  Surgeon: Waynetta Sandy, MD;  Location: [place];  Service: Vascular;  Laterality: Left;  . AV FISTULA PLACEMENT Left 08/25/2019   Procedure: ARTERIOVENOUS (AV) BRACHIOCEPHALIC FISTULA CREATION;  Surgeon: Waynetta Sandy, MD;  Location: [place];  Service: Vascular;  Laterality: Left;  . CARDIAC CATHETERIZATION    . CARDIAC CATHETERIZATION N/A 05/17/2015   Procedure: Left Heart Cath and Cors/Grafts Angiography;  Surgeon: [name], MD;  Location: [place] [facility];  Service: Cardiovascular;  Laterality: N/A;  . COLONOSCOPY Left 02/25/2020   Procedure: COLONOSCOPY;  Surgeon: Lavena Bullion, DO;  Location: [place];  Service: Gastroenterology;  Laterality: Left;  . COLONOSCOPY WITH PROPOFOL N/A 04/21/2016   Procedure: COLONOSCOPY WITH PROPOFOL;  Surgeon: Irene Shipper, MD;  Location:  [place];  Service: Endoscopy;  Laterality: N/A;  . CORONARY ARTERY BYPASS GRAFT  07/09/2012   Procedure: CORONARY ARTERY BYPASS GRAFTING (CABG);  Surgeon: Ivin Poot, MD;  Location: [place];  Service: Open Heart Surgery;  Laterality: N/A;  . ENTEROSCOPY N/A 10/07/2019   Procedure: ENTEROSCOPY;  Surgeon: Jackquline Denmark, MD;  Location: [facility] ENDOSCOPY;  Service: Endoscopy;  Laterality: N/A;  . ENTEROSCOPY Left 02/25/2020   Procedure: ENTEROSCOPY;  Surgeon: Lavena Bullion, DO;  Location: [place];  Service: Gastroenterology;  Laterality: Left;  . ENTEROSCOPY N/A 04/07/2020   Procedure: ENTEROSCOPY;  Surgeon: Yetta Flock, MD;  Location: [facility] ENDOSCOPY;  Service: Gastroenterology;  Laterality: N/A;  . ESOPHAGOGASTRODUODENOSCOPY N/A 04/20/2016   Procedure: ESOPHAGOGASTRODUODENOSCOPY (EGD);  Surgeon: Gatha Mayer, MD;  Location: [facility] ENDOSCOPY;  Service: Endoscopy;  Laterality: N/A;  . ESOPHAGOGASTRODUODENOSCOPY (EGD) WITH PROPOFOL N/A 08/17/2019   Procedure: ESOPHAGOGASTRODUODENOSCOPY (EGD) WITH PROPOFOL;  Surgeon: Irene Shipper, MD;  Location: [facility] ENDOSCOPY;  Service: Endoscopy;  Laterality: N/A;  . HEMOSTASIS CLIP PLACEMENT  02/25/2020   Procedure: HEMOSTASIS CLIP  PLACEMENT;  Surgeon: Lavena Bullion, DO;  Location: [place] ENDOSCOPY;  Service: Gastroenterology;;  . HOT HEMOSTASIS N/A 02/25/2020   Procedure: HOT HEMOSTASIS (ARGON PLASMA COAGULATION/BICAP);  Surgeon: Lavena Bullion, DO;  Location: [facility] ENDOSCOPY;  Service: Gastroenterology;  Laterality: N/A;  . HOT HEMOSTASIS N/A 04/07/2020   Procedure: HOT HEMOSTASIS (ARGON PLASMA COAGULATION/BICAP);  Surgeon: Yetta Flock, MD;  Location: [facility] ENDOSCOPY;  Service: Gastroenterology;  Laterality: N/A;  . INSERTION OF DIALYSIS CATHETER Right 08/25/2019   Procedure: INSERTION OF DIALYSIS CATHETER;  Surgeon: Waynetta Sandy, MD;  Location: [place];  Service: Vascular;  Laterality: Right;  . IR FLUORO GUIDE CV LINE RIGHT  08/19/2019    . IR US GUIDE VASC ACCESS RIGHT  08/19/2019  . LEFT HEART CATHETERIZATION WITH CORONARY ANGIOGRAM N/A 06/29/2012   Procedure: LEFT HEART CATHETERIZATION WITH CORONARY ANGIOGRAM;  Surgeon: Peter M Martinique, MD;  Location: [facility] CATH LAB;  Service: Cardiovascular;  Laterality: N/A;  . PERIPHERAL VASCULAR ATHERECTOMY Left 02/06/2020   Procedure: PERIPHERAL VASCULAR ATHERECTOMY;  Surgeon: Waynetta Sandy, MD;  Location: [place] CV LAB;  Service: Cardiovascular;  Laterality: Left;  SFA  . PERIPHERAL VASCULAR INTERVENTION Left 02/06/2020   Procedure: PERIPHERAL VASCULAR INTERVENTION;  Surgeon: Waynetta Sandy, MD;  Location: [place] CV LAB;  Service: Cardiovascular;  Laterality: Left;  SFA  . REVISON OF ARTERIOVENOUS FISTULA Left 02/07/2020   Procedure: REVISON OF ARTERIOVENOUS FISTULA;  Surgeon: Waynetta Sandy, MD;  Location: [place];  Service: Vascular;  Laterality: Left;  . STERNAL WOUND DEBRIDEMENT  08/17/2012   Procedure: STERNAL WOUND DEBRIDEMENT;  Surgeon: Ivin Poot, MD;  Location: [place];  Service: Thoracic;  Laterality: N/A;  wound vac application  . STERNAL WOUND DEBRIDEMENT  08/24/2012   Procedure: STERNAL WOUND DEBRIDEMENT;  Surgeon: Ivin Poot, MD;  Location: [place];  Service: Thoracic;  Laterality: N/A;  . STERNAL WOUND DEBRIDEMENT  09/01/2012   Procedure: STERNAL WOUND DEBRIDEMENT;  Surgeon: Ivin Poot, MD;  Location: [place];  Service: Thoracic;  Laterality: N/A;  . STERNAL WOUND DEBRIDEMENT  09/20/2012   Procedure: STERNAL WOUND DEBRIDEMENT;  Surgeon: Ivin Poot, MD;  Location: [facility] OR;  Service: Thoracic;  Laterality: N/A;  wound vac change  . SUBMUCOSAL TATTOO INJECTION  10/07/2019   Procedure: SUBMUCOSAL TATTOO INJECTION;  Surgeon: Jackquline Denmark, MD;  Location: [facility] ENDOSCOPY;  Service: Endoscopy;;   Soc Hx - widowed. Came to hospital from SNF.   reports that she has been smoking cigarettes. She has a 12.50 pack-year smoking history. She has never  used smokeless tobacco. She reports current drug use. Drug: Cocaine. She reports that she does not drink alcohol.  Allergies  Allergen Reactions  . Naproxen Rash    Family History  Problem Relation Age of Onset  . Diabetes Mother   . Hypertension Mother   . Cancer Mother   . Hyperlipidemia Father   . Hypertension Father   . Kidney disease Father   . Gout Father   . Cerebrovascular Accident Father   . Other Other        no known family CAD     Prior to Admission medications   Medication Sig Start Date End Date Taking? Authorizing Provider  albuterol (VENTOLIN HFA) 108 (90 Base) MCG/ACT inhaler Inhale 2 puffs into the lungs every 6 (six) hours as needed for wheezing or shortness of breath.    [provider]  allopurinol (ZYLOPRIM) 100 MG tablet Take 1 tablet (100 mg total) by  mouth daily. 02/28/20   Gladys Damme, MD  aspirin 81 MG EC tablet Take 1 tablet (81 mg total) by mouth daily. 02/28/20   Gladys Damme, MD  atorvastatin (LIPITOR) 40 MG tablet Take 1 tablet (40 mg total) by mouth daily at 6 PM. 02/28/20   Gladys Damme, MD  buPROPion [facility] SR) 150 MG 12 hr tablet Take 1 tablet (150 mg total) by mouth 2 (two) times daily. 01/16/16   Cristal Ford, DO  calcitRIOL (ROCALTROL) 0.25 MCG capsule Take 9 capsules (2.25 mcg total) by mouth every Tuesday, Thursday, and Saturday at 6 PM. 04/19/20   Barb Merino, MD  cetirizine (ZYRTEC) 10 MG tablet Take 10 mg by mouth daily as needed for allergies.     [provider]  cinacalcet (SENSIPAR) 30 MG tablet Take 1 tablet (30 mg total) by mouth every Tuesday, Thursday, and Saturday at 6 PM. 04/19/20   Barb Merino, MD  ferrous sulfate 325 (65 FE) MG tablet Take 325 mg by mouth 2 (two) times daily with a meal.     [provider]  gabapentin (NEURONTIN) 300 MG capsule Take 1 capsule (300 mg total) by mouth at bedtime. 02/10/20   Aline August, MD  insulin lispro (HUMALOG) 100 UNIT/ML injection Inject 2  Units into the skin 3 (three) times daily before meals.    [provider]  latanoprost (XALATAN) 0.005 % ophthalmic solution Place 1 drop into both eyes at bedtime.    [provider]  LINZESS 72 MCG capsule Take 72 mcg by mouth daily before breakfast.  11/21/17   [provider]  metoprolol tartrate (LOPRESSOR) 25 MG tablet Take 1 tablet (25 mg total) by mouth 2 (two) times daily. 02/10/20   Aline August, MD  mometasone-formoterol (DULERA) 100-5 MCG/ACT AERO Inhale 2 puffs into the lungs every 4 (four) hours as needed for wheezing.    [provider]  nitroGLYCERIN (NITROSTAT) 0.4 MG SL tablet Place 1 tablet (0.4 mg total) under the tongue every 5 (five) minutes as needed for chest pain. 04/30/19   [name], MD  pantoprazole (PROTONIX) 40 MG tablet Take 1 tablet (40 mg total) by mouth daily. 02/28/20 04/03/20  Gladys Damme, MD  polyethylene glycol (MIRALAX) 17 g packet Take 17 g by mouth daily as needed for moderate constipation. 02/10/20   Aline August, MD  TUMS 500 MG chewable tablet Chew 2 tablets by mouth at bedtime. 01/19/20   [provider]  VELPHORO 500 MG chewable tablet Chew 2 tablets (1,000 mg total) by mouth 3 (three) times daily with meals. 02/10/20   Aline August, MD    Physical Exam: Vitals:   04/03/2020 1700 04/12/2020 1730 03/29/2020 1800 04/27/2020 1830  BP: (!) 138/50 (!) 150/60 (!) 160/70 139/75  Pulse: 74 76 76 77  Resp: 16 18 18 18   Temp:      TempSrc:      SpO2:      Weight:        Constitutional: NAD, calm, comfortable Vitals:   04/12/2020 1700 04/20/2020 1730 04/26/2020 1800 04/22/2020 1830  BP: (!) 138/50 (!) 150/60 (!) 160/70 139/75  Pulse: 74 76 76 77  Resp: 16 18 18 18   Temp:      TempSrc:      SpO2:      Weight:       General:  Morbidly obese woman somnolent but in no apparent distress, being dialyzed.Eyes: PERRL, lids and conjunctivae normal ENMT: Mucous membranes are dry.  Edentulous Neck: obese,  no  masses. Respiratory: clear to anterior auscultation bilaterally, no wheezing, no crackles. Normal respiratory effort. No accessory muscle use.  Cardiovascular>: IRIR rate controlled, no murmurs. 1+ extremity edema. No palpalbe pedal pulses.   Abdomen: morbidly obese, no tenderness, Bowel sounds positive.  Musculoskeletal: UE appear normal. Right foot with black toes, no drainage, no pulses. Left foot with large open wound post great toe amputation. Some sero sanquinous drainage. No odor.   Skin: see above. Back and sacrum not examined - could not roll patient on HD Neurologic: CN 2-12 grossly intact. Psychiatric:  Somnolent.  Labs on Admission: I have personally reviewed following labs and imaging studies  CBC: Recent Labs  Lab 04/19/20 0208 03/31/2020 1315  WBC 10.4 7.9  NEUTROABS  --  5.8  HGB 8.8* 8.8*  HCT 29.2* 29.0*  MCV 87.7 86.1  PLT 419* 093*   Basic Metabolic Panel: Recent Labs  Lab 04/19/20 0731 04/03/2020 1315  NA 137 138  K 4.4 6.6*  CL 99 97*  CO2 24 22  GLUCOSE 181* 89  BUN 57* 81*  CREATININE 7.10* 11.32*  CALCIUM 8.3* 8.2*  PHOS 4.0  --    GFR: Estimated Creatinine Clearance: 6.4 mL/min (A) (by C-G formula based on SCr of 11.32 mg/dL (H)). Liver Function Tests: Recent Labs  Lab 04/19/20 0731 04/12/2020 1315  AST  --  21  ALT  --  7  ALKPHOS  --  102  BILITOT  --  0.7  PROT  --  6.8  ALBUMIN 2.1* 2.4*   No results for input(s): LIPASE, AMYLASE in the last 168 hours. No results for input(s): AMMONIA in the last 168 hours. Coagulation Profile: Recent Labs  Lab 04/12/2020 1315  INR 1.3*   Cardiac Enzymes: No results for input(s): CKTOTAL, CKMB, CKMBINDEX, TROPONINI in the last 168 hours. BNP (last 3 results) No results for input(s): PROBNP in the last 8760 hours. HbA1C: No results for input(s): HGBA1C in the last 72 hours. CBG: Recent Labs  Lab 04/18/20 2010 04/19/20 0520 04/19/20 1204 04/19/20 1606 04/21/2020 1310  GLUCAP 144* 183* 101*  160* 76   Lipid Profile: No results for input(s): CHOL, HDL, LDLCALC, TRIG, CHOLHDL, LDLDIRECT in the last 72 hours. Thyroid Function Tests: No results for input(s): TSH, T4TOTAL, FREET4, T3FREE, THYROIDAB in the last 72 hours. Anemia Panel: No results for input(s): VITAMINB12, FOLATE, FERRITIN, TIBC, IRON, RETICCTPCT in the last 72 hours. Urine analysis:    Component Value Date/Time   COLORURINE YELLOW 08/19/2019 0712   APPEARANCEUR HAZY (A) 08/19/2019 0712   LABSPEC 1.012 08/19/2019 0712   PHURINE 5.0 08/19/2019 0712   GLUCOSEU NEGATIVE 08/19/2019 0712   HGBUR NEGATIVE 08/19/2019 0712   BILIRUBINUR NEGATIVE 08/19/2019 0712   KETONESUR NEGATIVE 08/19/2019 0712   PROTEINUR NEGATIVE 08/19/2019 0712   UROBILINOGEN 0.2 05/12/2015 0938   NITRITE NEGATIVE 08/19/2019 0712   LEUKOCYTESUR SMALL (A) 08/19/2019 0712    Radiological Exams on Admission: DG Chest 2 View  Result Date: 04/13/2020 CLINICAL DATA:  69 year old female with suspected sepsis. Bilateral foot infection for the past 3 weeks. EXAM: CHEST - 2 VIEW COMPARISON:  Chest x-ray 04/04/2020. FINDINGS: Lung volumes are normal. No consolidative airspace disease. No pleural effusions. No evidence of frank pulmonary edema. Mild cephalization of the pulmonary vasculature. Mild cardiomegaly. Upper mediastinal contours are within normal limits. Aortic atherosclerosis. Status post median sternotomy for CABG. Right internal jugular PermCath with tip terminating in the mid to distal superior vena cava. Aortic atherosclerosis. IMPRESSION: 1. Postoperative changes  and support apparatus, as above. 2. Mild cardiomegaly with pulmonary venous congestion, without frank pulmonary edema. 3. Aortic atherosclerosis. Electronically Signed   By: Vinnie Langton M.D.   On: 04/21/2020 14:52   DG Foot Complete Left  Result Date: 03/30/2020 CLINICAL DATA:  Recent amputation. Suspect sepsis. Bilateral foot infection 3 weeks. EXAM: LEFT FOOT - COMPLETE 3+ VIEW  COMPARISON:  04/09/2020 FINDINGS: There is generalized decreased bone mineralization. Evidence of patient's recent amputation distal to the distal shaft of the first metatarsal. Expected postsurgical changes at the amputation site. No focal bone destruction to suggest osteomyelitis. No air within the soft tissues. Exam is otherwise unchanged. IMPRESSION: Expected changes post amputation distal to the distal aspect of the first metatarsal. No definite bone destruction to suggest osteomyelitis. No air within the soft tissues. Electronically Signed   By: Marin Olp M.D.   On: 04/22/2020 14:58   DG Foot Complete Right  Result Date: 04/13/2020 CLINICAL DATA:  Recent amputation with possible infection three weeks. Suspect sepsis. EXAM: RIGHT FOOT COMPLETE - 3+ VIEW COMPARISON:  CT 04/12/2020 FINDINGS: Diffuse decreased bone mineralization. Degenerative change over the first MTP joint and interphalangeal joints. Mild horizontal sclerosis with subtle lucency involving the head of the fifth metatarsal possibly subacute fracture. No focal bone destruction to suggest osteomyelitis. No air within the soft tissues. IMPRESSION: 1. No definite evidence of osteomyelitis and no air within the soft tissues. 2. Linear sclerosis with subtle lucency involving the head of the fifth metatarsal which may be due to nondisplaced subacute fracture. Electronically Signed   By: Marin Olp M.D.   On: 04/04/2020 14:56    EKG: Independently reviewed. Atrial flutter with variable AV block, LVH, possible lateral ischemia  Assessment/Plan Active Problems:   Essential hypertension   Type 2 diabetes mellitus with chronic kidney disease on chronic dialysis, with long-term current use of insulin (HCC)   Chronic combined systolic and diastolic CHF (congestive heart failure) (HCC)   ESRD on dialysis (HCC)   Obesity, Class III, BMI 40-49.9 (morbid obesity) ([place])   Diabetic gastroparesis (HCC)   Unspecified atrial fibrillation (HCC)    Gangrene of right foot ([place])  (please populate well all problems here in Problem List. (For example, if patient is on BP meds at home and you resume or decide to hold them, it is a problem that needs to be her. Same for CAD, COPD, HLD and so on)   1. Gangrene Right foot - patient with serious vascular disease, s/p amputation of left great toe. Now with fever, MS changes and dry gangrene right foot. She has been seen by vascular surgery on multiple admissions. Plan Tele admit  IV abx with [facility]-pharmacy following and Zosyn  Will most likely come to right LE amputation - level to be determined  F/u lab: CBC  2. ESRD on HD - patient had missed several sessions and presents with hyperkalemia. Dr. Posey Pronto of nephrology has seen patient and wrote HD orders Plan Nephrology to follow  3. DM - last A1C with good control. Patient was on low dose preprandial insulin. Plan Lantus 10 u qHS 2/2 infection and stress  Sliding scale coverage.  4. HTN- stable BP at this time. Will continue home meds.  5. Cardia - stable a fib by history on ASA only. No Acute cardiac issues on presentation. PLan Tele monitoring  Continue home medications.  6. Covid positive - patient test positive. She is coming from SNF. She has multiple comordities and is at high risk, despite  sating well Plan Remdesivir per pharmacy.  7. Disposition - patient for SNF when medically stable. TOC consult placed.   DVT prophylaxis: ASA, SCDs (Lovenox/Heparin/SCD's/anticoagulated/None (if comfort care) Code Status: full code (Full/Partial (specify details) Family Communication: spoke with Aunt, Karie Kirks, who will talk with the rest of the family. She understands the dx and tx plan  Disposition Plan: SNF when medically stable Consults called: Dr. Posey Pronto - nephrology, Dr Donzetta Matters - vascular surgery (with names) Admission status: inpatient - medical tele (i   Adella Hare MD [facility] Pager [phone]  If 7PM-7AM,  please contact night-coverage [email or web address] Password [facility]  04/15/2020, 7:02 PM

## 2020-04-25 NOTE — ED Triage Notes (Signed)
Pt bib ems from [facility] with reports of altered mental status. Per ems, staff reports pt has been hallucinating today. Recent amputation of toes to R foot. Staff reports yellowish drainage from wound since yesterday. EMS reports pt with pinpoint pupils. VSS. Dialysis pt, last treatment yesterday.  99.70F oral RR 20 122 palpated HR 86

## 2020-04-26 DIAGNOSIS — I5042 Chronic combined systolic (congestive) and diastolic (congestive) heart failure: Secondary | ICD-10-CM

## 2020-04-26 LAB — CBC
HCT: 26.4 % — ABNORMAL LOW (ref 36.0–46.0)
Hemoglobin: 8.2 g/dL — ABNORMAL LOW (ref 12.0–15.0)
MCH: 26.7 pg (ref 26.0–34.0)
MCHC: 31.1 g/dL (ref 30.0–36.0)
MCV: 86 fL (ref 80.0–100.0)
Platelets: 325 10*3/uL (ref 150–400)
RBC: 3.07 MIL/uL — ABNORMAL LOW (ref 3.87–5.11)
RDW: 18.9 % — ABNORMAL HIGH (ref 11.5–15.5)
WBC: 7.7 10*3/uL (ref 4.0–10.5)
nRBC: 0 % (ref 0.0–0.2)

## 2020-04-26 LAB — BASIC METABOLIC PANEL
Anion gap: 14 (ref 5–15)
BUN: 36 mg/dL — ABNORMAL HIGH (ref 8–23)
CO2: 25 mmol/L (ref 22–32)
Calcium: 8 mg/dL — ABNORMAL LOW (ref 8.9–10.3)
Chloride: 100 mmol/L (ref 98–111)
Creatinine, Ser: 6.71 mg/dL — ABNORMAL HIGH (ref 0.44–1.00)
GFR calc Af Amer: 7 mL/min — ABNORMAL LOW (ref >60)
GFR calc non Af Amer: 6 mL/min — ABNORMAL LOW (ref >60)
Glucose, Bld: 161 mg/dL — ABNORMAL HIGH (ref 70–99)
Potassium: 4.4 mmol/L (ref 3.5–5.1)
Sodium: 139 mmol/L (ref 135–145)

## 2020-04-26 LAB — RENAL FUNCTION PANEL
Albumin: 2 g/dL — ABNORMAL LOW (ref 3.5–5.0)
Anion gap: 16 — ABNORMAL HIGH (ref 5–15)
BUN: 41 mg/dL — ABNORMAL HIGH (ref 8–23)
CO2: 25 mmol/L (ref 22–32)
Calcium: 7.7 mg/dL — ABNORMAL LOW (ref 8.9–10.3)
Chloride: 101 mmol/L (ref 98–111)
Creatinine, Ser: 7.61 mg/dL — ABNORMAL HIGH (ref 0.44–1.00)
GFR calc Af Amer: 6 mL/min — ABNORMAL LOW (ref >60)
GFR calc non Af Amer: 5 mL/min — ABNORMAL LOW (ref >60)
Glucose, Bld: 79 mg/dL (ref 70–99)
Phosphorus: 5.6 mg/dL — ABNORMAL HIGH (ref 2.5–4.6)
Potassium: 4.8 mmol/L (ref 3.5–5.1)
Sodium: 142 mmol/L (ref 135–145)

## 2020-04-26 LAB — CBG MONITORING, ED
Glucose-Capillary: 61 mg/dL — ABNORMAL LOW (ref 70–99)
Glucose-Capillary: 110 mg/dL — ABNORMAL HIGH (ref 70–99)
Glucose-Capillary: 68 mg/dL — ABNORMAL LOW (ref 70–99)

## 2020-04-26 LAB — GLUCOSE, CAPILLARY
Glucose-Capillary: 77 mg/dL (ref 70–99)
Glucose-Capillary: 131 mg/dL — ABNORMAL HIGH (ref 70–99)

## 2020-04-26 MED ORDER — LIDOCAINE-PRILOCAINE 2.5-2.5 % EX CREA: 1.0000 "application " | TOPICAL_CREAM | CUTANEOUS | Status: DC | PRN

## 2020-04-26 MED ORDER — SODIUM CHLORIDE 0.9 % IV SOLN: 100.0000 mL | INTRAVENOUS | Status: DC | PRN

## 2020-04-26 MED ORDER — PENTAFLUOROPROP-TETRAFLUOROETH EX AERO
1.0000 "application " | INHALATION_SPRAY | CUTANEOUS | Status: DC | PRN
  Filled 2020-04-26: qty 116

## 2020-04-26 MED ORDER — LIDOCAINE HCL (PF) 1 % IJ SOLN
5.0000 mL | INTRAMUSCULAR | Status: DC | PRN
  Filled 2020-04-26: qty 5

## 2020-04-26 MED ORDER — HEPARIN SODIUM (PORCINE) 5000 UNIT/ML IJ SOLN
5000.0000 [IU] | Freq: Three times a day (TID) | INTRAMUSCULAR | Status: DC
  Administered 2020-04-26 – 2020-04-27 (×3): 5000 [IU] via SUBCUTANEOUS
  Filled 2020-04-26 (×3): qty 1

## 2020-04-26 MED ORDER — HEPARIN SODIUM (PORCINE) 1000 UNIT/ML DIALYSIS: 1000.0000 [IU] | INTRAMUSCULAR | Status: DC | PRN

## 2020-04-26 MED ORDER — ALTEPLASE 2 MG IJ SOLR: 2.0000 mg | Freq: Once | INTRAMUSCULAR | Status: DC | PRN

## 2020-04-26 MED ORDER — PENTAFLUOROPROP-TETRAFLUOROETH EX AERO: 1.0000 "application " | INHALATION_SPRAY | CUTANEOUS | Status: DC | PRN

## 2020-04-26 MED ORDER — DEXTROSE 50 % IV SOLN
INTRAVENOUS | Status: AC
  Administered 2020-04-26: 50 mL
  Filled 2020-04-26: qty 50

## 2020-04-26 MED ORDER — PIPERACILLIN-TAZOBACTAM IN DEX 2-0.25 GM/50ML IV SOLN
2.2500 g | Freq: Three times a day (TID) | INTRAVENOUS | Status: DC
  Administered 2020-04-26 – 2020-05-04 (×23): 2.25 g via INTRAVENOUS
  Filled 2020-04-26 (×27): qty 50

## 2020-04-26 MED ORDER — METOPROLOL TARTRATE 5 MG/5ML IV SOLN
5.0000 mg | INTRAVENOUS | Status: DC | PRN
  Administered 2020-04-26: 05:00:00 5 mg via INTRAVENOUS
  Filled 2020-04-26: qty 5

## 2020-04-26 MED ORDER — HEPARIN SODIUM (PORCINE) 1000 UNIT/ML DIALYSIS
1000.0000 [IU] | INTRAMUSCULAR | Status: DC | PRN
  Filled 2020-04-26: qty 1

## 2020-04-26 MED ORDER — PANTOPRAZOLE SODIUM 40 MG PO TBEC
40.0000 mg | DELAYED_RELEASE_TABLET | Freq: Two times a day (BID) | ORAL | Status: DC
  Administered 2020-04-26 – 2020-04-27 (×2): 40 mg via ORAL
  Filled 2020-04-26 (×2): qty 1

## 2020-04-26 MED ORDER — HEPARIN SODIUM (PORCINE) 1000 UNIT/ML DIALYSIS
1000.0000 [IU] | INTRAMUSCULAR | Status: DC | PRN
  Administered 2020-05-01: 3000 [IU] via INTRAVENOUS_CENTRAL
  Filled 2020-04-26: qty 1

## 2020-04-26 MED ORDER — LIDOCAINE HCL (PF) 1 % IJ SOLN: 5.0000 mL | INTRAMUSCULAR | Status: DC | PRN

## 2020-04-26 MED ORDER — LIDOCAINE-PRILOCAINE 2.5-2.5 % EX CREA
1.0000 "application " | TOPICAL_CREAM | CUTANEOUS | Status: DC | PRN
  Filled 2020-04-26: qty 5

## 2020-04-26 MED ORDER — INSULIN ASPART 100 UNIT/ML ~~LOC~~ SOLN: 0.0000 [IU] | Freq: Three times a day (TID) | SUBCUTANEOUS | Status: DC

## 2020-04-26 NOTE — Plan of Care (Signed)
  Problem: Education: Goal: Knowledge of General Education information will improve Description Including pain rating scale, medication(s)/side effects and non-pharmacologic comfort measures Outcome: Progressing   

## 2020-04-26 NOTE — Progress Notes (Signed)
  Progress Note    04/26/2020 11:14 AM * No surgery found *  Subjective: More alert this morning  Vitals:   04/26/20 1007 04/26/20 1015  BP: 118/77 131/63  Pulse:  81  Resp: 20 (!) 21  Temp:    SpO2:  98%    Physical Exam: Awake and alert Nonlabored respirations Right foot gangrenous changes of all 5 toes Left foot gangrenous changes of amputation site  CBC    Component Value Date/Time   WBC 7.9 03/29/2020 1315   RBC 3.37 (L) 04/22/2020 1315   HGB 8.8 (L) 04/11/2020 1315   HCT 29.0 (L) 04/01/2020 1315   HCT 22.0 (L) 10/06/2019 0021   PLT 416 (H) 04/03/2020 1315   MCV 86.1 04/09/2020 1315   MCH 26.1 04/22/2020 1315   MCHC 30.3 04/11/2020 1315   RDW 18.9 (H) 04/01/2020 1315   LYMPHSABS 1.3 04/27/2020 1315   MONOABS 0.7 04/09/2020 1315   EOSABS 0.0 04/18/2020 1315   BASOSABS 0.1 04/02/2020 1315    BMET    Component Value Date/Time   NA 139 04/26/2020 0500   K 4.4 04/26/2020 0500   CL 100 04/26/2020 0500   CO2 25 04/26/2020 0500   GLUCOSE 161 (H) 04/26/2020 0500   BUN 36 (H) 04/26/2020 0500   CREATININE 6.71 (H) 04/26/2020 0500   CREATININE 2.62 (H) 04/29/2016 1629   CALCIUM 8.0 (L) 04/26/2020 0500   GFRNONAA 6 (L) 04/26/2020 0500   GFRAA 7 (L) 04/26/2020 0500    INR    Component Value Date/Time   INR 1.3 (H) 04/15/2020 1315     Intake/Output Summary (Last 24 hours) at 04/26/2020 1114 Last data filed at 04/17/2020 1957 Gross per 24 hour  Intake --  Output 2000 ml  Net -2000 ml     Assessment/plan:  69 y.o. female is status post left first toe transmetatarsal amputation.  This has had some delayed healing has necrosis at the base the wound will need at least further amputation or debridement of this side.  On the right side she has no gangrenous changes of all of her toes and is being admitted with SIRS criteria.  Does not appear to be septic at this time.  She will need angiography of the right lower extremity followed by amputation of all 5 toes  and debridement of left foot wound.  I have recommended angiography beginning today but patient is refusing.  Will likely need angiography Monday and will proceed with above-noted amputation and debridements following revascularization.   Ahkeem Goede C. Donzetta Matters, MD [facility] Office: [phone] Pager: [phone]  04/26/2020 11:14 AM

## 2020-04-26 NOTE — Progress Notes (Signed)
Navigator notified OP HD clinic/[facility] of patient's positive COVID test. Patient will need to receive OP HD treatment in isolation at discharge and location/shift will depend on timing of discharge. Navigator will follow closely for discharge plan and stay in touch with OP HD clinic to coordinate care.  Alphonzo Cruise, [place] [facility]igator [phone]

## 2020-04-26 NOTE — Progress Notes (Addendum)
PROGRESS NOTE                                                                                                                                                                                                             Patient Demographics:    [name] [name], is a 69 y.o. female, DOB - Jun 28, 1951, PTW:[id number]  Outpatient Primary MD for the patient is [place] date - 04/06/2020   LOS - 1  Chief Complaint  Patient presents with  . Altered Mental Status       Brief Narrative: Patient is a 69 y.o. female with PMHx of ESRD, PAD-s/p left great toe amputation on 04/11/2020, CAD s/p CABG, DM-2, HTN-who was transferred from her SNF for evaluation of acute metabolic encephalopathy in the setting of gangrenous changes to the right foot.  She was also incidentally found to have COVID-19.  See below for further details  Significant Events: 4/6-4/22>> admit to [facility] for upper GI bleeding and blood loss anemia, left great toe osteo s/p amputation 4/28>> admit to [facility] for acute metabolic encephalopathy  [id number] medications: Remdesivir: 4/28>> 4/29  Antibiotics: Vancomycin: 4/28>> Zosyn: 4/28>>  Microbiology data: 4/28: Blood culture>> no growth 4/28: Wound swab/superficial culture>> pending  DVT prophylaxis: SQ heparin starting on 4/29-given Covid infection-at high risk for VTE.  Procedures: 4/14>> left great toe amputation 4/10>> EGD  Consults: V VS, renal    Subjective:    [name] today is awake-alert-lying comfortably in bed.  She is not hypoxic.   Assessment  & Plan :   Acute metabolic encephalopathy: Likely secondary to SIRS/right foot gangrene-uremia due to missed HD-improved--continue supportive care and treatment of underlying infectious issues.  She has a nonfocal exam.  SIRS (present on admission) with right lower extremity critical limb ischemia: Afebrile-SIRS  physiology has improved-has gangrenous changes evident on exam-please see pictures taken on 4/28 and prior notes.  Remains on empiric antimicrobial therapy-blood cultures so far negative.  Vascular surgery following-with plans for angiogram and amputation later today.  History of PAD with recent left great toe amputation-now with nonhealing left foot wound: Vascular surgery planning debridement later today.  Remains on aspirin and statin.  Hyperkalemia: Secondary to missed HD-resolved with HD.  Follow periodically.  ESRD: Nephrology following and directing care.  Anemia: Multifactorial-likely secondary to kidney disease-iron/iron  per nephrology.  HTN: BP relatively well controlled-continue metoprolol  DM-2 with hypoglycemia: Hypoglycemic episode earlier this morning-hold Lantus for now-change SSI to a very sensitive scale.  She is n.p.o. for potential vascular surgery procedure later today.  Follow and adjust.  Recent Labs    04/20/2020 2346 04/26/20 0500 04/26/20 0627  GLUCAP 77 61* 110*   Paroxysmal atrial fibrillation/atrial flutter: Not a new diagnosis-Per discharge summary in October 2020-she also had atrial fibrillation done as well.  Given recurrent GI bleeding/anemia-not a good candidate for anticoagulation.  Continue rate control with metoprolol.  Chronic combined diastolic and systolic heart failure (EF 45-50% by TTE on 04/28/2019): Volume status stable-diuresis with HD.  History of recurrent GI bleeding with acute blood loss anemia secondary to AVMs: No active GI bleeding ongoing currently-patient is s/p EGD/push enteroscopy on 4/10.  GI recommended a colonoscopy done but patient refused.  Will keep on PPI-remains on aspirin.  Follow CBC closely.  History of CVA: Continue aspirin/statin-see above regarding anticoagulation  Gout: Not in flare-continue allopurinol  Covid 19 Viral infection: Appears to be asymptomatic-we will go and stop Remdesivir for now.  Chest x-ray without  pneumonia-she was on room air this morning.  Fever: afebrile  O2 requirements:  SpO2: 98 % O2 Flow Rate (L/min): 2 L/min   COVID-19 Labs: No results for input(s): DDIMER, FERRITIN, LDH, CRP in the last 72 hours.     Component Value Date/Time   BNP 897.8 (H) 01/01/2020 0600    No results for input(s): PROCALCITON in the last 168 hours.  Lab Results  Component Value Date   SARSCOV2NAA POSITIVE (A) 04/13/2020   [place] NEGATIVE 04/16/2020   [place] NEGATIVE 04/03/2020   [place] NEGATIVE 03/15/2020     Morbid Obesity: Estimated body mass index is 40.91 kg/m as calculated from the following:   Height as of 04/11/20: 5' 7.99" (1.727 m).   Weight as of this encounter: 122 kg.     ABG:    Component Value Date/Time   PHART 7.419 02/05/2020 1114   PCO2ART 43.0 02/05/2020 1114   PO2ART 85.1 02/05/2020 1114   HCO3 27.2 02/05/2020 1114   TCO2 22 01/01/2020 0614   ACIDBASEDEF 6.0 (H) 01/01/2020 0614   O2SAT 95.3 02/05/2020 1114    Vent Settings: N/A    Condition - Guarded  Family Communication  : Aunt over the phone on 4/29  Code Status :  Full Code  Diet :  Diet Order            Diet NPO time specified  Diet effective midnight               Disposition Plan  :  Status is: Inpatient  Remains inpatient appropriate because:Inpatient level of care appropriate due to severity of illness   Dispo: The patient is from: SNF              Anticipated d/c is to: SNF              Anticipated d/c date is: > 3 days              Patient currently is not medically stable to d/c.   Barriers to discharge: Right foot gangrene-requiring amputation-remains on IV antibiotics  Antimicorbials  :    Anti-infectives (From admission, onward)   Start     Dose/Rate Route Frequency Ordered Stop   04/26/20 1000  remdesivir 100 mg in sodium chloride 0.9 % 100 mL IVPB     100 mg 200  mL/hr over 30 Minutes Intravenous Daily 04/03/2020 1952 05/17/2020 0959   04/02/2020 2030   remdesivir 200 mg in sodium chloride 0.9% 250 mL IVPB     200 mg 580 mL/hr over 30 Minutes Intravenous Once 04/15/2020 1952 04/03/2020 2327   04/22/2020 1515  vancomycin (VANCOCIN) 2,500 mg in sodium chloride 0.9 % 500 mL IVPB     2,500 mg 250 mL/hr over 120 Minutes Intravenous  Once 04/21/2020 1513 04/23/2020 2026   04/12/2020 1513  vancomycin variable dose per unstable renal function (pharmacist dosing)      Does not apply See admin instructions 04/24/2020 1514     04/13/2020 1500  piperacillin-tazobactam (ZOSYN) IVPB 3.375 g     3.375 g 100 mL/hr over 30 Minutes Intravenous  Once 04/05/2020 1450 04/06/2020 2026      Inpatient Medications  Scheduled Meds: . allopurinol  100 mg Oral Daily  . aspirin EC  81 mg Oral Daily  . atorvastatin  40 mg Oral q1800  . buPROPion  150 mg Oral BID  . calcitRIOL  2.25 mcg Oral Q T,Th,Sat-1800  . Chlorhexidine Gluconate Cloth  6 each Topical Q0600  . cinacalcet  30 mg Oral Q T,Th,Sat-1800  . gabapentin  300 mg Oral QHS  . insulin aspart  0-15 Units Subcutaneous TID WC  . insulin glargine  10 Units Subcutaneous QHS  . linaclotide  72 mcg Oral QAC breakfast  . loratadine  10 mg Oral Daily  . metoprolol tartrate  25 mg Oral BID  . pantoprazole  40 mg Oral Daily  . sodium chloride flush  3 mL Intravenous Q12H  . sucroferric oxyhydroxide  1,000 mg Oral TID WC  . sucroferric oxyhydroxide  1,000 mg Oral TID WC  . vancomycin variable dose per unstable renal function (pharmacist dosing)   Does not apply See admin instructions   Continuous Infusions: . sodium chloride    . remdesivir 100 mg in NS 100 mL 100 mg (04/26/20 1010)   PRN Meds:.sodium chloride, acetaminophen, albuterol, metoprolol tartrate, mometasone-formoterol, nitroGLYCERIN, oxyCODONE, polyethylene glycol, sodium chloride flush   Time Spent in minutes 35   See all Orders from today for further details   Oren Binet M.D on 04/26/2020 at 10:26 AM  To page go to [email or web address] - use universal  password  [facility] -  Office  [phone]    Objective:   Vitals:   04/26/20 0800 04/26/20 0815 04/26/20 1007 04/26/20 1015  BP: 140/80 (!) 127/58 118/77 131/63  Pulse:    81  Resp: 19 (!) 25 20 (!) 21  Temp:      TempSrc:      SpO2:    98%  Weight:        Wt Readings from Last 3 Encounters:  04/13/2020 122 kg  04/19/20 118.4 kg  03/02/20 120.2 kg     Intake/Output Summary (Last 24 hours) at 04/26/2020 1026 Last data filed at 04/10/2020 1957 Gross per 24 hour  Intake --  Output 2000 ml  Net -2000 ml     Physical Exam Gen Exam:Alert awake-not in any distress HEENT:atraumatic, normocephalic Chest: B/L clear to auscultation anteriorly CVS:S1S2 regular Abdomen:soft non tender, non distended Extremities: Trace edema Neurology: Difficult exam but seems to be moving all four extremities. Skin: no rash   Data Review:    CBC Recent Labs  Lab 04/03/2020 1315  WBC 7.9  HGB 8.8*  HCT 29.0*  PLT 416*  MCV 86.1  MCH 26.1  MCHC 30.3  RDW 18.9*  LYMPHSABS  1.3  MONOABS 0.7  EOSABS 0.0  BASOSABS 0.1    Chemistries  Recent Labs  Lab 04/18/2020 1315 04/26/20 0500  NA 138 139  K 6.6* 4.4  CL 97* 100  CO2 22 25  GLUCOSE 89 161*  BUN 81* 36*  CREATININE 11.32* 6.71*  CALCIUM 8.2* 8.0*  AST 21  --   ALT 7  --   ALKPHOS 102  --   BILITOT 0.7  --    ------------------------------------------------------------------------------------------------------------------ No results for input(s): CHOL, HDL, LDLCALC, TRIG, CHOLHDL, LDLDIRECT in the last 72 hours.  Lab Results  Component Value Date   HGBA1C 5.1 04/03/2020   ------------------------------------------------------------------------------------------------------------------ No results for input(s): TSH, T4TOTAL, T3FREE, THYROIDAB in the last 72 hours.  Invalid input(s): FREET3 ------------------------------------------------------------------------------------------------------------------ No  results for input(s): VITAMINB12, FOLATE, FERRITIN, TIBC, IRON, RETICCTPCT in the last 72 hours.  Coagulation profile Recent Labs  Lab 04/20/2020 1315  INR 1.3*    No results for input(s): DDIMER in the last 72 hours.  Cardiac Enzymes No results for input(s): CKMB, TROPONINI, MYOGLOBIN in the last 168 hours.  Invalid input(s): CK ------------------------------------------------------------------------------------------------------------------    Component Value Date/Time   BNP 897.8 (H) 01/01/2020 0600    Micro Results Recent Results (from the past 240 hour(s))  SARS CORONAVIRUS 2 (TAT 6-24 HRS) Nasopharyngeal Nasopharyngeal Swab     Status: None   Collection Time: 04/16/20  3:23 PM   Specimen: Nasopharyngeal Swab  Result Value Ref Range Status   SARS Coronavirus 2 NEGATIVE NEGATIVE Final    Comment: (NOTE) SARS-CoV-2 target nucleic acids are NOT DETECTED. The SARS-CoV-2 RNA is generally detectable in upper and lower respiratory specimens during the acute phase of infection. Negative results do not preclude SARS-CoV-2 infection, do not rule out co-infections with other pathogens, and should not be used as the sole basis for treatment or other patient management decisions. Negative results must be combined with clinical observations, patient history, and epidemiological information. The expected result is Negative. Fact Sheet for Patients: SugarRoll.be Fact Sheet for Healthcare Providers: https://www.woods-mathews.com/ This test is not yet approved or cleared by the [place] FDA and  has been authorized for detection and/or diagnosis of SARS-CoV-2 by FDA under an Emergency Use Authorization (EUA). This EUA will remain  in effect (meaning this test can be used) for the duration of the COVID-19 declaration under Section 56 4(b)(1) of the Act, 21 U.S.C. section 360bbb-3(b)(1), unless the authorization is terminated or revoked  sooner. Performed at [facility], [place] [address], [place], [place] [address]   Culture, blood (Routine x 2)     Status: None (Preliminary result)   Collection Time: 03/31/2020  1:00 PM   Specimen: BLOOD  Result Value Ref Range Status   Specimen Description BLOOD RIGHT ANTECUBITAL  Final   Special Requests   Final    BOTTLES DRAWN AEROBIC AND ANAEROBIC Blood Culture results may not be optimal due to an inadequate volume of blood received in culture bottles   Culture   Final    NO GROWTH < 24 HOURS Performed at [facility], [place] [address] [place], [place] [address]    Report Status PENDING  Incomplete  Culture, blood (Routine x 2)     Status: None (Preliminary result)   Collection Time: 04/01/2020  1:20 PM   Specimen: BLOOD RIGHT HAND  Result Value Ref Range Status   Specimen Description BLOOD RIGHT HAND  Final   Special Requests   Final    BOTTLES DRAWN AEROBIC  ONLY Blood Culture results may not be optimal due to an inadequate volume of blood received in culture bottles   Culture   Final    NO GROWTH < 24 HOURS Performed at [facility] [address], [place], [place] [address]    Report Status PENDING  Incomplete  Respiratory Panel by RT PCR (Flu A&B, Covid) - Nasopharyngeal Swab     Status: Abnormal   Collection Time: 04/27/2020  3:58 PM   Specimen: Nasopharyngeal Swab  Result Value Ref Range Status   SARS Coronavirus 2 by RT PCR POSITIVE (A) NEGATIVE Final    Comment: RESULT CALLED TO, READ BACK BY AND VERIFIED WITH: [facility] RN 04/12/2020 1751 JDW (NOTE) SARS-CoV-2 target nucleic acids are DETECTED. SARS-CoV-2 RNA is generally detectable in upper respiratory specimens  during the acute phase of infection. Positive results are indicative of the presence of the identified virus, but do not rule out bacterial infection or co-infection with other pathogens not detected by the test. Clinical correlation with patient history and other diagnostic information is  necessary to determine patient infection status. The expected result is Negative. Fact Sheet for Patients:  PinkCheek.be Fact Sheet for Healthcare Providers: GravelBags.it This test is not yet approved or cleared by the [place] FDA and  has been authorized for detection and/or diagnosis of SARS-CoV-2 by FDA under an Emergency Use Authorization (EUA).  This EUA will remain in effect (meaning this test can be used) for th e duration of  the COVID-19 declaration under Section 564(b)(1) of the Act, 21 U.S.C. section 360bbb-3(b)(1), unless the authorization is terminated or revoked sooner.    Influenza A by PCR NEGATIVE NEGATIVE Final   Influenza B by PCR NEGATIVE NEGATIVE Final    Comment: (NOTE) The Xpert Xpress SARS-CoV-2/FLU/RSV assay is intended as an aid in  the diagnosis of influenza from Nasopharyngeal swab specimens and  should not be used as a sole basis for treatment. Nasal washings and  aspirates are unacceptable for Xpert Xpress SARS-CoV-2/FLU/RSV  testing. Fact Sheet for Patients: PinkCheek.be Fact Sheet for Healthcare Providers: GravelBags.it This test is not yet approved or cleared by the [place] FDA and  has been authorized for detection and/or diagnosis of SARS-CoV-2 by  FDA under an Emergency Use Authorization (EUA). This EUA will remain  in effect (meaning this test can be used) for the duration of the  Covid-19 declaration under Section 564(b)(1) of the Act, 21  U.S.C. section 360bbb-3(b)(1), unless the authorization is  terminated or revoked. Performed at [facility], [place] [address] [place], [place] [address]   Wound or Superficial Culture     Status: None (Preliminary result)   Collection Time: 04/20/2020  8:43 PM   Specimen: Toe; Wound  Result Value Ref Range Status   Specimen Description TOE  Final   Special Requests RIGHT   Final   Gram Stain NO WBC SEEN NO ORGANISMS SEEN   Final   Culture   Final    CULTURE REINCUBATED FOR BETTER GROWTH Performed at [facility], [address], [place], [place] [address]    Report Status PENDING  Incomplete    Radiology Reports DG Chest 2 View  Result Date: 04/22/2020 CLINICAL DATA:  69 year old female with suspected sepsis. Bilateral foot infection for the past 3 weeks. EXAM: CHEST - 2 VIEW COMPARISON:  Chest x-ray 04/04/2020. FINDINGS: Lung volumes are normal. No consolidative airspace disease. No pleural effusions. No evidence of frank pulmonary edema. Mild cephalization  of the pulmonary vasculature. Mild cardiomegaly. Upper mediastinal contours are within normal limits. Aortic atherosclerosis. Status post median sternotomy for CABG. Right internal jugular PermCath with tip terminating in the mid to distal superior vena cava. Aortic atherosclerosis. IMPRESSION: 1. Postoperative changes and support apparatus, as above. 2. Mild cardiomegaly with pulmonary venous congestion, without frank pulmonary edema. 3. Aortic atherosclerosis. Electronically Signed   By: Vinnie Langton M.D.   On: 04/18/2020 14:52   CT FOOT RIGHT WO CONTRAST  Result Date: 04/12/2020 CLINICAL DATA:  Pain and swelling. EXAM: CT OF THE RIGHT FOOT WITHOUT CONTRAST TECHNIQUE: Multidetector CT imaging of the right foot was performed according to the standard protocol. Multiplanar CT image reconstructions were also generated. COMPARISON:  Radiographs 04/09/2020 FINDINGS: Diffuse subcutaneous soft tissue swelling/edema/fluid involving the entire ankle and foot. No focal fluid collection to suggest a drainable soft tissue abscess. Marked fatty atrophy of the foot and ankle musculature. Suspect myofasciitis involving the plantar foot musculature but no definite findings for pyomyositis. Suspect an open wound along the plantar aspect of the great toe but I do not see any obvious destructive bony changes to suggest  osteomyelitis. No definite findings for septic arthritis. The other bony structures of the foot and ankle appear intact. No obvious destructive bony changes, fractures or osteochondral lesions. Extensive small artery calcifications. IMPRESSION: 1. Diffuse subcutaneous soft tissue swelling/edema/fluid involving the entire ankle and foot consistent with cellulitis. No focal fluid collection to suggest a drainable soft tissue abscess. 2. Suspect myofasciitis involving the plantar foot musculature but no definite findings for pyomyositis. Severe diffuse fatty atrophy of the foot and ankle musculature. 3. Suspect an open wound along the plantar aspect of the great toe but I do not see any obvious destructive bony changes to suggest osteomyelitis. No definite findings for septic arthritis. 4. If symptoms persist or worsen MRI is suggested for further evaluation. Electronically Signed   By: Marijo Sanes M.D.   On: 04/12/2020 10:51   DG CHEST PORT 1 VIEW  Result Date: 04/04/2020 CLINICAL DATA:  69 year old female with fever. EXAM: PORTABLE CHEST 1 VIEW COMPARISON:  Chest radiograph dated 02/24/2020. FINDINGS: Right-sided dialysis catheter in similar position. There is cardiomegaly with mild vascular congestion. No focal consolidation, pleural effusion or pneumothorax. Median sternotomy wires and CABG vascular clips. No acute osseous pathology. IMPRESSION: Cardiomegaly with mild vascular congestion. No focal consolidation. Electronically Signed   By: Anner Crete M.D.   On: 04/04/2020 17:29   DG ABD ACUTE 2+V W 1V CHEST  Result Date: 04/09/2020 CLINICAL DATA:  Fever with low hemoglobin. EXAM: DG ABDOMEN ACUTE W/ 1V CHEST COMPARISON:  Abdominal film 06/26/2018 and chest x-ray 02/24/2020 FINDINGS: Right IJ central venous catheter unchanged with tip over the SVC. Lungs are adequately inflated with minimal hazy prominence of the perihilar vessels unchanged and likely due to mild chronic vascular congestion. No lobar  consolidation or effusion. Mild stable cardiomegaly. Remainder the chest is unchanged. There are several air-filled large and small bowel loops. A few air-filled small bowel loops at the upper limits of normal in diameter over the left abdomen. No evidence of air-fluid levels or free peritoneal air. Remainder of the exam is unchanged. IMPRESSION: Nonspecific, nonobstructive bowel gas pattern with a few air-filled mildly prominent, but nondilated small bowel loops in the left abdomen. Stable mild cardiomegaly with suggestion of minimal chronic vascular congestion. Electronically Signed   By: Marin Olp M.D.   On: 04/09/2020 09:57   DG Foot Complete Left  Result Date: 04/13/2020  CLINICAL DATA:  Recent amputation. Suspect sepsis. Bilateral foot infection 3 weeks. EXAM: LEFT FOOT - COMPLETE 3+ VIEW COMPARISON:  04/09/2020 FINDINGS: There is generalized decreased bone mineralization. Evidence of patient's recent amputation distal to the distal shaft of the first metatarsal. Expected postsurgical changes at the amputation site. No focal bone destruction to suggest osteomyelitis. No air within the soft tissues. Exam is otherwise unchanged. IMPRESSION: Expected changes post amputation distal to the distal aspect of the first metatarsal. No definite bone destruction to suggest osteomyelitis. No air within the soft tissues. Electronically Signed   By: Marin Olp M.D.   On: 04/20/2020 14:58   DG Foot Complete Left  Result Date: 04/09/2020 CLINICAL DATA:  Foot pain and swelling EXAM: LEFT FOOT - COMPLETE 3+ VIEW COMPARISON:  02/01/2020 MRI, 01/31/2020 plain film FINDINGS: There is been interval amputation of the first toe. Persistent soft tissue wound is seen. There is lucency identified in the head of the first metatarsal suspicious for recurrent osteomyelitis. No other fracture or dislocation is seen. No other soft tissue abnormality is noted. IMPRESSION: Changes suspicious for osteomyelitis in the head of the  first metatarsal. Overlying soft tissue wound is noted. Electronically Signed   By: Inez Catalina M.D.   On: 04/09/2020 19:40   DG Foot Complete Right  Result Date: 04/03/2020 CLINICAL DATA:  Recent amputation with possible infection three weeks. Suspect sepsis. EXAM: RIGHT FOOT COMPLETE - 3+ VIEW COMPARISON:  CT 04/12/2020 FINDINGS: Diffuse decreased bone mineralization. Degenerative change over the first MTP joint and interphalangeal joints. Mild horizontal sclerosis with subtle lucency involving the head of the fifth metatarsal possibly subacute fracture. No focal bone destruction to suggest osteomyelitis. No air within the soft tissues. IMPRESSION: 1. No definite evidence of osteomyelitis and no air within the soft tissues. 2. Linear sclerosis with subtle lucency involving the head of the fifth metatarsal which may be due to nondisplaced subacute fracture. Electronically Signed   By: Marin Olp M.D.   On: 04/02/2020 14:56   VAS [place] LOWER EXTREMITY VENOUS (DVT)  Result Date: 04/10/2020  Lower Venous DVTStudy Indications: Edema, and fever.  Limitations: Body habitus and poor ultrasound/tissue interface. Comparison Study: 04/28/19 previous Performing Technologist: Abram Sander RVS  Examination Guidelines: A complete evaluation includes B-mode imaging, spectral Doppler, color Doppler, and power Doppler as needed of all accessible portions of each vessel. Bilateral testing is considered an integral part of a complete examination. Limited examinations for reoccurring indications may be performed as noted. The reflux portion of the exam is performed with the patient in reverse Trendelenburg.  +---------+---------------+---------+-----------+----------+--------------+ RIGHT    CompressibilityPhasicitySpontaneityPropertiesThrombus Aging +---------+---------------+---------+-----------+----------+--------------+ CFV      Full           Yes      Yes                                  +---------+---------------+---------+-----------+----------+--------------+ SFJ      Full                                                        +---------+---------------+---------+-----------+----------+--------------+ FV Prox  Full                                                        +---------+---------------+---------+-----------+----------+--------------+  FV Mid   Full                                                        +---------+---------------+---------+-----------+----------+--------------+ FV DistalFull                                                        +---------+---------------+---------+-----------+----------+--------------+ PFV      Full                                                        +---------+---------------+---------+-----------+----------+--------------+ POP      Full           Yes      Yes                                 +---------+---------------+---------+-----------+----------+--------------+ PTV      Full                                                        +---------+---------------+---------+-----------+----------+--------------+ PERO                                                  Not visualized +---------+---------------+---------+-----------+----------+--------------+   +---------+---------------+---------+-----------+----------+--------------+ LEFT     CompressibilityPhasicitySpontaneityPropertiesThrombus Aging +---------+---------------+---------+-----------+----------+--------------+ CFV      Full           Yes      Yes                                 +---------+---------------+---------+-----------+----------+--------------+ SFJ      Full                                                        +---------+---------------+---------+-----------+----------+--------------+ FV Prox  Full                                                         +---------+---------------+---------+-----------+----------+--------------+ FV Mid   Full                                                        +---------+---------------+---------+-----------+----------+--------------+  FV DistalFull                                                        +---------+---------------+---------+-----------+----------+--------------+ PFV      Full                                                        +---------+---------------+---------+-----------+----------+--------------+ POP      Full           Yes      Yes                                 +---------+---------------+---------+-----------+----------+--------------+ PTV      Full                                                        +---------+---------------+---------+-----------+----------+--------------+ PERO                                                  Not visualized +---------+---------------+---------+-----------+----------+--------------+     Summary: BILATERAL: - No evidence of deep vein thrombosis seen in the lower extremities, bilaterally.   *See table(s) above for measurements and observations. Electronically signed by Deitra Mayo MD on 04/10/2020 at 3:24:49 PM.    Final

## 2020-04-26 NOTE — ED Notes (Signed)
Pts. HR jumping from 70s-130s in AFIB rhythm. MD on call notified. New orders received and acknowledged.

## 2020-04-27 DIAGNOSIS — U071 COVID-19: Secondary | ICD-10-CM

## 2020-04-27 DIAGNOSIS — I96 Gangrene, not elsewhere classified: Secondary | ICD-10-CM | POA: Diagnosis not present

## 2020-04-27 DIAGNOSIS — K921 Melena: Secondary | ICD-10-CM | POA: Diagnosis not present

## 2020-04-27 DIAGNOSIS — N186 End stage renal disease: Secondary | ICD-10-CM

## 2020-04-27 DIAGNOSIS — K5520 Angiodysplasia of colon without hemorrhage: Secondary | ICD-10-CM

## 2020-04-27 DIAGNOSIS — Z992 Dependence on renal dialysis: Secondary | ICD-10-CM

## 2020-04-27 LAB — CBC
HCT: 28.4 % — ABNORMAL LOW (ref 36.0–46.0)
HCT: 28.3 % — ABNORMAL LOW (ref 36.0–46.0)
Hemoglobin: 8.5 g/dL — ABNORMAL LOW (ref 12.0–15.0)
Hemoglobin: 8.6 g/dL — ABNORMAL LOW (ref 12.0–15.0)
MCH: 26 pg (ref 26.0–34.0)
MCH: 26.3 pg (ref 26.0–34.0)
MCHC: 29.9 g/dL — ABNORMAL LOW (ref 30.0–36.0)
MCHC: 30.4 g/dL (ref 30.0–36.0)
MCV: 86.9 fL (ref 80.0–100.0)
MCV: 86.5 fL (ref 80.0–100.0)
Platelets: 315 10*3/uL (ref 150–400)
Platelets: 322 10*3/uL (ref 150–400)
RBC: 3.27 MIL/uL — ABNORMAL LOW (ref 3.87–5.11)
RBC: 3.27 MIL/uL — ABNORMAL LOW (ref 3.87–5.11)
RDW: 19 % — ABNORMAL HIGH (ref 11.5–15.5)
RDW: 18.9 % — ABNORMAL HIGH (ref 11.5–15.5)
WBC: 8 10*3/uL (ref 4.0–10.5)
WBC: 7.8 10*3/uL (ref 4.0–10.5)
nRBC: 0.3 % — ABNORMAL HIGH (ref 0.0–0.2)
nRBC: 0 % (ref 0.0–0.2)

## 2020-04-27 LAB — GLUCOSE, CAPILLARY
Glucose-Capillary: 90 mg/dL (ref 70–99)
Glucose-Capillary: 109 mg/dL — ABNORMAL HIGH (ref 70–99)
Glucose-Capillary: 98 mg/dL (ref 70–99)
Glucose-Capillary: 170 mg/dL — ABNORMAL HIGH (ref 70–99)

## 2020-04-27 LAB — C-REACTIVE PROTEIN: CRP: 17.9 mg/dL — ABNORMAL HIGH (ref <1.0)

## 2020-04-27 LAB — RENAL FUNCTION PANEL
Albumin: 2 g/dL — ABNORMAL LOW (ref 3.5–5.0)
Anion gap: 17 — ABNORMAL HIGH (ref 5–15)
BUN: 49 mg/dL — ABNORMAL HIGH (ref 8–23)
CO2: 22 mmol/L (ref 22–32)
Calcium: 7.3 mg/dL — ABNORMAL LOW (ref 8.9–10.3)
Chloride: 100 mmol/L (ref 98–111)
Creatinine, Ser: 8.44 mg/dL — ABNORMAL HIGH (ref 0.44–1.00)
GFR calc Af Amer: 5 mL/min — ABNORMAL LOW (ref >60)
GFR calc non Af Amer: 4 mL/min — ABNORMAL LOW (ref >60)
Glucose, Bld: 69 mg/dL — ABNORMAL LOW (ref 70–99)
Phosphorus: 5.8 mg/dL — ABNORMAL HIGH (ref 2.5–4.6)
Potassium: 4.5 mmol/L (ref 3.5–5.1)
Sodium: 139 mmol/L (ref 135–145)

## 2020-04-27 MED ORDER — PEG-KCL-NACL-NASULF-NA ASC-C 100 G PO SOLR
0.5000 | Freq: Once | ORAL | Status: AC
  Administered 2020-04-28: 100 g via ORAL
  Filled 2020-04-27: qty 1

## 2020-04-27 MED ORDER — POLYETHYLENE GLYCOL 3350 17 GM/SCOOP PO POWD
0.5000 | Freq: Once | ORAL | Status: AC
  Administered 2020-04-27: 127.5 g via ORAL
  Filled 2020-04-27: qty 255

## 2020-04-27 MED ORDER — PEG-KCL-NACL-NASULF-NA ASC-C 100 G PO SOLR: 1.0000 | Freq: Once | ORAL | Status: DC

## 2020-04-27 MED ORDER — VANCOMYCIN HCL IN DEXTROSE 1-5 GM/200ML-% IV SOLN
1000.0000 mg | INTRAVENOUS | Status: DC
  Filled 2020-04-27: qty 200

## 2020-04-27 MED ORDER — PANTOPRAZOLE SODIUM 40 MG IV SOLR
40.0000 mg | Freq: Two times a day (BID) | INTRAVENOUS | Status: DC
  Administered 2020-04-27 – 2020-04-29 (×6): 40 mg via INTRAVENOUS
  Filled 2020-04-27 (×6): qty 40

## 2020-04-27 NOTE — Progress Notes (Signed)
   Noted patient gi bleed. 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 Will need to be last case given Covid positive status.  I discussed this plan with both the patient and her aunt Dorothy via telephone  Servando Snare, MD

## 2020-04-27 NOTE — Progress Notes (Signed)
CSW notes patient has been at [facility] for rehab and will require insurance authorization once medically stable for discharge. CSW will continue to follow.   Elisabeth Strom LCSW

## 2020-04-27 NOTE — Progress Notes (Signed)
Pharmacy Antibiotic Note  [name] [name] is a 69 y.o. female admitted on 04/22/2020 with cellulitis.  Pharmacy  consulted 04/02/2020 for Vancomycin dosing. Patient is afebrile, WBC wnl. Pt has stage III CKD and gets HD TTSa PTA - skipped dialysis 4/27.   Last HD done 4/28.  Vancomycin loading dose 2500 units IV x1 given on 4/28.  Next HD ordered for Saturday 5/1.   Weight = 122 kg Plan: Plan to give vancomycin 1000mg  IV with every HD session - will enter doses as HD is scheduled HD planned 5/1, thus have ordered Vancomycin 1000 mg IV at end of HD 5/1. F/u HD plans Monitor HD sessions, cultures/sensitivities, and clinical progression  Weight: 122 kg (268 lb 15.4 oz)  Temp (24hrs), Avg:99.6 F (37.6 C), Min:98 F (36.7 C), Max:101.9 F (38.8 C)  Recent Labs  Lab 04/01/2020 1315 04/26/20 0500 04/26/20 1630 04/27/20 0445  WBC 7.9  --  7.7 8.0  CREATININE 11.32* 6.71* 7.61* 8.44*  LATICACIDVEN 1.5  --   --   --     Estimated Creatinine Clearance: 8.7 mL/min (A) (by C-G formula based on SCr of 8.44 mg/dL (H)).    Allergies  Allergen Reactions  . Naproxen Rash    Antimicrobials this admission: Vancomycin 4/28 >>  Zosyn 4/28>>  Remdesivir 4/28>>4/29  Dose adjustments this admission: N/A  Microbiology results: 4/28 Blood - NGTD 4/28 Wound - pseudomonas aeruginosa pending 4/28 COVID+  Thank you for allowing pharmacy to be part of this patients care team. Nicole Cella, [facility] Pharmacist [phone] 04/27/2020  11:54 AM  Please check [email or web address] for unit-specific pharmacy phone numbers.

## 2020-04-27 NOTE — Consult Note (Signed)
Referring Provider:  Dr. Sloan Leiter, [facility] Primary Care Physician:  [place] Primary Gastroenterologist:  Dr. Carlean Purl  Reason for Consultation:  GIB, anemia  HPI: ELDRED LIEVANOS is a 69 y.o. female with PMH of ESRD on HDTTS. IDDM. Morbid obesity. CVA. CAD. CABG 2013. CHF,EF 45-50%. Peripheral neuropathy. Cocaine abuseas recentlyas 07/2019. Infrarenal abdominal aneurysm 3.1 cm on CT of 07/2019. A. fib, not on anticoagulation due to history of GI bleed and frequent falls. Admitted to [facility] on this occasion with acute metabolic encephalopathy likely secondary to SIRS/right foot gangrene and uremia due to missed HD. She has been on antibiotics, but there are plans for angiogram and possible amputation. This was supposed to occur yesterday, however, she refused and it has now been scheduled for Monday, May 3.  GI consult was called after she had a bloody bowel movement this morning.  I spoke with the patient via phone and from what I can tell the bleeding has not really resolved even since her last hospitalization earlier this month.  Hemoglobin has been stable in the 8 g range, 8.5 g this morning.  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  Has requuired transfusions on multiple occasions.   03/2016 EGD:For evaluation of IDA, FOBT positive. Medium sized hiatal hernia, no Cameron's lesions. Otherwise normal study. 03/2016 colonoscopy: solitary, nonbleeding colonic AVM at mid ascending colon, not treated. 07/2019 EGDfor anemia, FOBT positive. Mild esophagitis, esophageal stricture. HH, no Cameron erosions. Retained gastric contents c/wgastroparesis. No source for blood loss isolated. 10/07/2019 SB enteroscopy:Nonbleeding Cameron erosion within moderate hiatal hernia. Study performed to the jejunum and otherwise normal. Tattoo placed at distal extent of exam. 02/25/2020 Enteroscopy: nonbleeding AVMs  in the gastric antrum and duodenum both obliterated with APC.  Exam to the jejunum was otherwise normal. 02/25/20 Colonoscopy: Small, bleeding AVM at hepatic flexure treated with Endo Clip.  3 mm, sessile polyp at hepatic flexure not treated due to active bleeding in same area.  Small, nonbleeding internal hemorrhoids. 04/07/20 Enteroscopy: - Esophagogastric landmarks identified. - 4 cm hiatal hernia. - Normal stomach. Interval improvement of GAVE following APC in February. - A single non-bleeding angiodysplastic lesion in the duodenum. Treated with argon plasma coagulation (APC). - The examined portion of the jejunum was normal.  Past Medical History:  Diagnosis Date  . Abscess   . Acute blood loss anemia 08/17/2019  . Acute pulmonary edema (HCC)   . Acute respiratory failure ([place]) 10/18/2014  . Acute respiratory failure with hypoxia and hypercapnia ([place]) 06/01/2019  . AMS (altered mental status) 01/01/2020  . Anemia 08/2016  . Angiodysplasia of colon   . Arthritis of left shoulder region 03/23/2013  . Bleeding gastrointestinal   . Cardiomegaly 05/2019  . Chest pain 04/17/2016  . Chronic combined systolic and diastolic CHF (congestive heart failure) (HCC)    a. EF 40-45%, mild LVH, mid apicalanteroseptal and apical HK.  . CKD (chronic kidney disease), stage III   . Cocaine abuse ([place])    crack cocaine heavily until 2008 then sporadic use since then  . Coronary artery disease    a. 06/2012 NSTEMI/CABG x 3 (LIMA->LAD, VG->OM2, VG->LCX);  b. 04/2015 MV: EF<30%, mid ant, apicalanterior, apical infarct;  c. 04/2015 Cath: LM nl, LAD 90p, LCX 74m, OM1 min irregs, RCA mild dzs, LIMA->LAD nl w/ dist LAD dzs, VG->OM2 nl, VG->LCX nl-->Med Rx.  . CVA (cerebral infarction)    a. right internal capsule stroke in  12/2006  . Demand ischemia ([place])   . Diabetes mellitus    diagnosed in 2008  . Elevated troponin 04/27/2019  . Essential hypertension   . Glaucoma   . Gout   . Heme positive stool   . HFrEF  (heart failure with reduced ejection fraction) ([place])   . Hyperlipidemia   . Hyperparathyroidism, secondary renal ([place])   . Hypertensive crisis 06/02/2019  . Left-sided sensory deficit present   . Lobar pneumonia ([place]) 04/27/2019  . Obesity, morbid ([place])   . Pneumonia   . Pulmonary edema 05/2019  . PVD (peripheral vascular disease) ([place])    a. 06/2012 ABI's: R - 0.73, L - 0.71.  Marland Kitchen Renal mass, right   . Sepsis ([place]) 04/27/2019  . Shortness of breath dyspnea   . Stroke ([place])   . Thrombocytosis ([place]) 04/17/2016  . Thyroid nodule    FNA in 2409 showed follicular cells but not definate neoplasm  . Tobacco abuse   . Trichomoniasis     Past Surgical History:  Procedure Laterality Date  . ABDOMINAL AORTOGRAM W/LOWER EXTREMITY Bilateral 02/06/2020   Procedure: ABDOMINAL AORTOGRAM W/LOWER EXTREMITY;  Surgeon: Waynetta Sandy, MD;  Location: [place] CV LAB;  Service: Cardiovascular;  Laterality: Bilateral;  . AMPUTATION Left 02/07/2020   Procedure: AMPUTATION DIGIT - LEFT GREAT TOE;  Surgeon: Waynetta Sandy, MD;  Location: [place];  Service: Vascular;  Laterality: Left;  . AMPUTATION Left 04/11/2020   Procedure: Transmetatarsal Amputation left 1st Toe.;  Surgeon: Waynetta Sandy, MD;  Location: [place];  Service: Vascular;  Laterality: Left;  . AV FISTULA PLACEMENT Left 08/25/2019   Procedure: ARTERIOVENOUS (AV) BRACHIOCEPHALIC FISTULA CREATION;  Surgeon: Waynetta Sandy, MD;  Location: [place];  Service: Vascular;  Laterality: Left;  . CARDIAC CATHETERIZATION    . CARDIAC CATHETERIZATION N/A 05/17/2015   Procedure: Left Heart Cath and Cors/Grafts Angiography;  Surgeon: [name], MD;  Location: [place] CV LAB;  Service: Cardiovascular;  Laterality: N/A;  . COLONOSCOPY Left 02/25/2020   Procedure: COLONOSCOPY;  Surgeon: Lavena Bullion, DO;  Location: [place];  Service: Gastroenterology;  Laterality: Left;  . COLONOSCOPY WITH PROPOFOL N/A 04/21/2016     Procedure: COLONOSCOPY WITH PROPOFOL;  Surgeon: Irene Shipper, MD;  Location: [place];  Service: Endoscopy;  Laterality: N/A;  . CORONARY ARTERY BYPASS GRAFT  07/09/2012   Procedure: CORONARY ARTERY BYPASS GRAFTING (CABG);  Surgeon: Ivin Poot, MD;  Location: [place];  Service: Open Heart Surgery;  Laterality: N/A;  . ENTEROSCOPY N/A 10/07/2019   Procedure: ENTEROSCOPY;  Surgeon: Jackquline Denmark, MD;  Location: [facility] ENDOSCOPY;  Service: Endoscopy;  Laterality: N/A;  . ENTEROSCOPY Left 02/25/2020   Procedure: ENTEROSCOPY;  Surgeon: Lavena Bullion, DO;  Location: [place];  Service: Gastroenterology;  Laterality: Left;  . ENTEROSCOPY N/A 04/07/2020   Procedure: ENTEROSCOPY;  Surgeon: Yetta Flock, MD;  Location: [facility] ENDOSCOPY;  Service: Gastroenterology;  Laterality: N/A;  . ESOPHAGOGASTRODUODENOSCOPY N/A 04/20/2016   Procedure: ESOPHAGOGASTRODUODENOSCOPY (EGD);  Surgeon: Gatha Mayer, MD;  Location: [facility] ENDOSCOPY;  Service: Endoscopy;  Laterality: N/A;  . ESOPHAGOGASTRODUODENOSCOPY (EGD) WITH PROPOFOL N/A 08/17/2019   Procedure: ESOPHAGOGASTRODUODENOSCOPY (EGD) WITH PROPOFOL;  Surgeon: Irene Shipper, MD;  Location: [facility] ENDOSCOPY;  Service: Endoscopy;  Laterality: N/A;  . HEMOSTASIS CLIP PLACEMENT  02/25/2020   Procedure: HEMOSTASIS CLIP PLACEMENT;  Surgeon: Lavena Bullion, DO;  Location: [place];  Service: Gastroenterology;;  . HOT HEMOSTASIS N/A 02/25/2020   Procedure: HOT HEMOSTASIS (ARGON  PLASMA COAGULATION/BICAP);  Surgeon: Lavena Bullion, DO;  Location: [facility] ENDOSCOPY;  Service: Gastroenterology;  Laterality: N/A;  . HOT HEMOSTASIS N/A 04/07/2020   Procedure: HOT HEMOSTASIS (ARGON PLASMA COAGULATION/BICAP);  Surgeon: Yetta Flock, MD;  Location: [facility] ENDOSCOPY;  Service: Gastroenterology;  Laterality: N/A;  . INSERTION OF DIALYSIS CATHETER Right 08/25/2019   Procedure: INSERTION OF DIALYSIS CATHETER;  Surgeon: Waynetta Sandy, MD;  Location: [place];   Service: Vascular;  Laterality: Right;  . IR FLUORO GUIDE CV LINE RIGHT  08/19/2019  . IR US GUIDE VASC ACCESS RIGHT  08/19/2019  . LEFT HEART CATHETERIZATION WITH CORONARY ANGIOGRAM N/A 06/29/2012   Procedure: LEFT HEART CATHETERIZATION WITH CORONARY ANGIOGRAM;  Surgeon: Peter M Martinique, MD;  Location: [facility] CATH LAB;  Service: Cardiovascular;  Laterality: N/A;  . PERIPHERAL VASCULAR ATHERECTOMY Left 02/06/2020   Procedure: PERIPHERAL VASCULAR ATHERECTOMY;  Surgeon: Waynetta Sandy, MD;  Location: [place] CV LAB;  Service: Cardiovascular;  Laterality: Left;  SFA  . PERIPHERAL VASCULAR INTERVENTION Left 02/06/2020   Procedure: PERIPHERAL VASCULAR INTERVENTION;  Surgeon: Waynetta Sandy, MD;  Location: [place] CV LAB;  Service: Cardiovascular;  Laterality: Left;  SFA  . REVISON OF ARTERIOVENOUS FISTULA Left 02/07/2020   Procedure: REVISON OF ARTERIOVENOUS FISTULA;  Surgeon: Waynetta Sandy, MD;  Location: [place];  Service: Vascular;  Laterality: Left;  . STERNAL WOUND DEBRIDEMENT  08/17/2012   Procedure: STERNAL WOUND DEBRIDEMENT;  Surgeon: Ivin Poot, MD;  Location: [place];  Service: Thoracic;  Laterality: N/A;  wound vac application  . STERNAL WOUND DEBRIDEMENT  08/24/2012   Procedure: STERNAL WOUND DEBRIDEMENT;  Surgeon: Ivin Poot, MD;  Location: [place];  Service: Thoracic;  Laterality: N/A;  . STERNAL WOUND DEBRIDEMENT  09/01/2012   Procedure: STERNAL WOUND DEBRIDEMENT;  Surgeon: Ivin Poot, MD;  Location: [place];  Service: Thoracic;  Laterality: N/A;  . STERNAL WOUND DEBRIDEMENT  09/20/2012   Procedure: STERNAL WOUND DEBRIDEMENT;  Surgeon: Ivin Poot, MD;  Location: [facility] OR;  Service: Thoracic;  Laterality: N/A;  wound vac change  . SUBMUCOSAL TATTOO INJECTION  10/07/2019   Procedure: SUBMUCOSAL TATTOO INJECTION;  Surgeon: Jackquline Denmark, MD;  Location: [facility] ENDOSCOPY;  Service: Endoscopy;;    Prior to Admission medications   Medication Sig Start Date End  Date Taking? Authorizing Provider  albuterol (VENTOLIN HFA) 108 (90 Base) MCG/ACT inhaler Inhale 2 puffs into the lungs every 6 (six) hours as needed for wheezing or shortness of breath.   Yes [provider]  allopurinol (ZYLOPRIM) 100 MG tablet Take 1 tablet (100 mg total) by mouth daily. 02/28/20  Yes Gladys Damme, MD  aspirin 81 MG EC tablet Take 1 tablet (81 mg total) by mouth daily. 02/28/20  Yes Gladys Damme, MD  atorvastatin (LIPITOR) 40 MG tablet Take 1 tablet (40 mg total) by mouth daily at 6 PM. Patient taking differently: Take 40 mg by mouth daily.  02/28/20  Yes Gladys Damme, MD  buPROPion [facility] SR) 150 MG 12 hr tablet Take 1 tablet (150 mg total) by mouth 2 (two) times daily. 01/16/16  Yes Mikhail, Velta Addison, DO  calcitRIOL (ROCALTROL) 0.25 MCG capsule Take 9 capsules (2.25 mcg total) by mouth every Tuesday, Thursday, and Saturday at 6 PM. 04/19/20  Yes Ghimire, Dante Gang, MD  cetirizine (ZYRTEC) 10 MG tablet Take 10 mg by mouth daily as needed for allergies.    Yes [provider]  cinacalcet (SENSIPAR) 30 MG tablet Take 1 tablet (30 mg total)  by mouth every Tuesday, Thursday, and Saturday at 6 PM. 04/19/20  Yes Ghimire, Dante Gang, MD  ferrous sulfate 325 (65 FE) MG tablet Take 325 mg by mouth 2 (two) times daily with a meal.    Yes [provider]  gabapentin (NEURONTIN) 300 MG capsule Take 1 capsule (300 mg total) by mouth at bedtime. 02/10/20  Yes Aline August, MD  insulin lispro (HUMALOG) 100 UNIT/ML injection Inject 2 Units into the skin 3 (three) times daily before meals.   Yes [provider]  latanoprost (XALATAN) 0.005 % ophthalmic solution Place 1 drop into both eyes at bedtime.   Yes [provider]  LINZESS 72 MCG capsule Take 72 mcg by mouth daily before breakfast.  11/21/17  Yes [provider]  metoprolol tartrate (LOPRESSOR) 25 MG tablet Take 1 tablet (25 mg total) by mouth 2 (two) times daily. 02/10/20  Yes Aline August, MD  mometasone-formoterol (DULERA) 100-5 MCG/ACT AERO Inhale 2 puffs into the lungs every 4 (four) hours as needed for wheezing.   Yes [provider]  nitroGLYCERIN (NITROSTAT) 0.4 MG SL tablet Place 1 tablet (0.4 mg total) under the tongue every 5 (five) minutes as needed for chest pain. 04/30/19  Yes [name], MD  pantoprazole (PROTONIX) 40 MG tablet Take 1 tablet (40 mg total) by mouth daily. 02/28/20 04/05/2020 Yes Gladys Damme, MD  polyethylene glycol (MIRALAX) 17 g packet Take 17 g by mouth daily as needed for moderate constipation. 02/10/20  Yes Aline August, MD  TUMS 500 MG chewable tablet Chew 2 tablets by mouth at bedtime. 01/19/20  Yes [provider]  VELPHORO 500 MG chewable tablet Chew 2 tablets (1,000 mg total) by mouth 3 (three) times daily with meals. 02/10/20  Yes Aline August, MD  lidocaine-prilocaine (EMLA) cream Apply 1 application topically See admin instructions. Apply a small amount to access site (AVF) three times a week, 1 hour before dialysis. Cover with Occlusive dressing (Saran Wrap) 04/02/20   [provider]    Current Facility-Administered Medications  Medication Dose Route Frequency Provider Last Rate Last Admin  . 0.9 %  sodium chloride infusion  250 mL Intravenous PRN Norins, Heinz Knuckles, MD      . 0.9 %  sodium chloride infusion  100 mL Intravenous PRN Valentina Gu, NP      . 0.9 %  sodium chloride infusion  100 mL Intravenous PRN Valentina Gu, NP      . acetaminophen (TYLENOL) tablet 650 mg  650 mg Oral Q6H PRN Opyd, Ilene Qua, MD   650 mg at 04/26/20 1420  . albuterol (PROVENTIL) (2.5 MG/3ML) 0.083% nebulizer solution 2.5 mg  2.5 mg Inhalation Q6H PRN Norins, Heinz Knuckles, MD      . allopurinol (ZYLOPRIM) tablet 100 mg  100 mg Oral Daily Neena Rhymes, MD   100 mg at 04/27/20 0841  . alteplase (CATHFLO ACTIVASE) injection 2 mg  2 mg Intracatheter Once PRN Valentina Gu, NP      . atorvastatin (LIPITOR)  tablet 40 mg  40 mg Oral q1800 Neena Rhymes, MD   40 mg at 04/26/20 1633  . buPROPion [facility] SR) 12 hr tablet 150 mg  150 mg Oral BID Neena Rhymes, MD   150 mg at 04/27/20 0840  . calcitRIOL (ROCALTROL) capsule 2.25 mcg  2.25 mcg Oral Q T,Th,Sat-1800 Norins, Heinz Knuckles, MD   2.25 mcg at 04/26/20 1730  . Chlorhexidine Gluconate Cloth 2 % PADS 6 each  6 each Topical Q0600 Loren Racer, PA-C   6 each at 04/27/20 1034  . cinacalcet (SENSIPAR) tablet 30 mg  30 mg Oral Q T,Th,Sat-1800 Norins, Heinz Knuckles, MD   30 mg at 04/26/20 1635  . gabapentin (NEURONTIN) capsule 300 mg  300 mg Oral QHS Norins, Heinz Knuckles, MD   300 mg at 04/26/20 2100  . heparin injection 1,000 Units  1,000 Units Dialysis PRN Valentina Gu, NP      . insulin aspart (novoLOG) injection 0-6 Units  0-6 Units Subcutaneous TID WC Ghimire, Shanker M, MD      . lidocaine (PF) (XYLOCAINE) 1 % injection 5 mL  5 mL Intradermal PRN Valentina Gu, NP      . lidocaine-prilocaine (EMLA) cream 1 application  1 application Topical PRN Valentina Gu, NP      . linaclotide Rolan Lipa) capsule 72 mcg  72 mcg Oral QAC breakfast Neena Rhymes, MD   72 mcg at 04/27/20 0840  . loratadine (CLARITIN) tablet 10 mg  10 mg Oral Daily Norins, Heinz Knuckles, MD   10 mg at 04/27/20 0840  . metoprolol tartrate (LOPRESSOR) injection 5 mg  5 mg Intravenous Q2H PRN Opyd, Ilene Qua, MD   5 mg at 04/26/20 0519  . metoprolol tartrate (LOPRESSOR) tablet 25 mg  25 mg Oral BID Neena Rhymes, MD   25 mg at 04/27/20 0841  . mometasone-formoterol (DULERA) 100-5 MCG/ACT inhaler 2 puff  2 puff Inhalation Q4H PRN Norins, Heinz Knuckles, MD      . nitroGLYCERIN (NITROSTAT) SL tablet 0.4 mg  0.4 mg Sublingual Q5 min PRN Norins, Heinz Knuckles, MD      . oxyCODONE (Oxy IR/ROXICODONE) immediate release tablet 5 mg  5 mg Oral Q4H PRN Norins, Heinz Knuckles, MD      . pantoprazole (PROTONIX) injection 40 mg  40 mg Intravenous Q12H Jonetta Osgood, MD   40 mg  at 04/27/20 1246  . pentafluoroprop-tetrafluoroeth (GEBAUERS) aerosol 1 application  1 application Topical PRN Valentina Gu, NP      . piperacillin-tazobactam (ZOSYN) IVPB 2.25 g  2.25 g Intravenous Q8H Rumbarger, Rachel L, RPH 100 mL/hr at 04/27/20 1033 2.25 g at 04/27/20 1033  . polyethylene glycol (MIRALAX / GLYCOLAX) packet 17 g  17 g Oral Daily PRN Norins, Heinz Knuckles, MD      . sodium chloride flush (NS) 0.9 % injection 3 mL  3 mL Intravenous Q12H Norins, Heinz Knuckles, MD   3 mL at 04/27/20 0842  . sodium chloride flush (NS) 0.9 % injection 3 mL  3 mL Intravenous PRN Norins, Heinz Knuckles, MD      . sucroferric oxyhydroxide [facility]) chewable tablet 1,000 mg  1,000 mg Oral TID WC Stephania Fragmin R, PA-C   1,000 mg at 04/27/20 1240  . [START ON 04/28/2020] vancomycin (VANCOCIN) IVPB 1000 mg/200 mL premix  1,000 mg Intravenous Q T,Th,Sa-HD Wendee Beavers, RPH      . vancomycin variable dose per unstable renal function (pharmacist dosing)   Does not apply See admin instructions Richardine Service, RPH        Allergies as of 04/01/2020 - Review Complete 03/29/2020  Allergen Reaction Noted  . Naproxen Rash 09/21/2013    Family History  Problem Relation Age of Onset  . Diabetes Mother   . Hypertension Mother   . Cancer Mother   . Hyperlipidemia Father   . Hypertension Father   . Kidney disease Father   . Gout  Father   . Cerebrovascular Accident Father   . Other Other        no known family CAD    Social History   Socioeconomic History  . Marital status: Single    Spouse name: Not on file  . Number of children: 0  . Years of education: Not on file  . Highest education level: Not on file  Occupational History  . Not on file  Tobacco Use  . Smoking status: Current Some Day Smoker    Packs/day: 0.25    Years: 50.00    Pack years: 12.50    Types: Cigarettes  . Smokeless tobacco: Never Used  Substance and Sexual Activity  . Alcohol use: No    Alcohol/week: 0.0 standard drinks  .  Drug use: Yes    Types: Cocaine    Comment: pt denies at current time  . Sexual activity: Not Currently  Other Topics Concern  . Not on file  Social History Narrative   Lives with boyfriend of 20 years.    Social Determinants of Health   Financial Resource Strain:   . Difficulty of Paying Living Expenses:   Food Insecurity:   . Worried About Charity fundraiser in the Last Year:   . Arboriculturist in the Last Year:   Transportation Needs:   . Film/video editor (Medical):   Marland Kitchen Lack of Transportation (Non-Medical):   Physical Activity:   . Days of Exercise per Week:   . Minutes of Exercise per Session:   Stress:   . Feeling of Stress :   Social Connections:   . Frequency of Communication with Friends and Family:   . Frequency of Social Gatherings with Friends and Family:   . Attends Religious Services:   . Active Member of Clubs or Organizations:   . Attends Archivist Meetings:   Marland Kitchen Marital Status:   Intimate Partner Violence:   . Fear of Current or Ex-Partner:   . Emotionally Abused:   Marland Kitchen Physically Abused:   . Sexually Abused:     Review of Systems: ROS is O/W negative except as mentioned in HPI.  Physical Exam: Vital signs in last 24 hours: Temp:  [98 F (36.7 C)-101.9 F (38.8 C)] 98.5 F (36.9 C) (04/30 0600) Pulse Rate:  [70-88] 70 (04/30 0746) Resp:  [15-22] 20 (04/30 1200) BP: (109-141)/(54-91) 126/64 (04/30 1200) SpO2:  [93 %-97 %] 93 % (04/30 0746) Last BM Date: 04/27/20  Patient not examined due to Covid status.  Intake/Output from previous day: 04/29 0701 - 04/30 0700 In: 290.1 [P.O.:240; IV Piggyback:50.1] Out: -  Intake/Output this shift: Total I/O In: 240 [P.O.:240] Out: -   Lab Results: Recent Labs    04/09/2020 1315 04/26/20 1630 04/27/20 0445  WBC 7.9 7.7 8.0  HGB 8.8* 8.2* 8.5*  HCT 29.0* 26.4* 28.4*  PLT 416* 325 315   BMET Recent Labs    04/26/20 0500 04/26/20 1630 04/27/20 0445  NA 139 142 139  K 4.4 4.8  4.5  CL 100 101 100  CO2 25 25 22   GLUCOSE 161* 79 69*  BUN 36* 41* 49*  CREATININE 6.71* 7.61* 8.44*  CALCIUM 8.0* 7.7* 7.3*   LFT Recent Labs    04/23/2020 1315 04/26/20 1630 04/27/20 0445  PROT 6.8  --   --   ALBUMIN 2.4*   < > 2.0*  AST 21  --   --   ALT 7  --   --  ALKPHOS 102  --   --   BILITOT 0.7  --   --    < > = values in this interval not displayed.   PT/INR Recent Labs    04/01/2020 1315  LABPROT 15.9*  INR 1.3*   Hepatitis Panel Recent Labs    04/26/2020 1920  HEPBSAG NON REACTIVE   Studies/Results: DG Chest 2 View  Result Date: 04/15/2020 CLINICAL DATA:  69 year old female with suspected sepsis. Bilateral foot infection for the past 3 weeks. EXAM: CHEST - 2 VIEW COMPARISON:  Chest x-ray 04/04/2020. FINDINGS: Lung volumes are normal. No consolidative airspace disease. No pleural effusions. No evidence of frank pulmonary edema. Mild cephalization of the pulmonary vasculature. Mild cardiomegaly. Upper mediastinal contours are within normal limits. Aortic atherosclerosis. Status post median sternotomy for CABG. Right internal jugular PermCath with tip terminating in the mid to distal superior vena cava. Aortic atherosclerosis. IMPRESSION: 1. Postoperative changes and support apparatus, as above. 2. Mild cardiomegaly with pulmonary venous congestion, without frank pulmonary edema. 3. Aortic atherosclerosis. Electronically Signed   By: Vinnie Langton M.D.   On: 04/09/2020 14:52   DG Foot Complete Left  Result Date: 04/07/2020 CLINICAL DATA:  Recent amputation. Suspect sepsis. Bilateral foot infection 3 weeks. EXAM: LEFT FOOT - COMPLETE 3+ VIEW COMPARISON:  04/09/2020 FINDINGS: There is generalized decreased bone mineralization. Evidence of patient's recent amputation distal to the distal shaft of the first metatarsal. Expected postsurgical changes at the amputation site. No focal bone destruction to suggest osteomyelitis. No air within the soft tissues. Exam is  otherwise unchanged. IMPRESSION: Expected changes post amputation distal to the distal aspect of the first metatarsal. No definite bone destruction to suggest osteomyelitis. No air within the soft tissues. Electronically Signed   By: Marin Olp M.D.   On: 04/11/2020 14:58   DG Foot Complete Right  Result Date: 04/16/2020 CLINICAL DATA:  Recent amputation with possible infection three weeks. Suspect sepsis. EXAM: RIGHT FOOT COMPLETE - 3+ VIEW COMPARISON:  CT 04/12/2020 FINDINGS: Diffuse decreased bone mineralization. Degenerative change over the first MTP joint and interphalangeal joints. Mild horizontal sclerosis with subtle lucency involving the head of the fifth metatarsal possibly subacute fracture. No focal bone destruction to suggest osteomyelitis. No air within the soft tissues. IMPRESSION: 1. No definite evidence of osteomyelitis and no air within the soft tissues. 2. Linear sclerosis with subtle lucency involving the head of the fifth metatarsal which may be due to nondisplaced subacute fracture. Electronically Signed   By: Marin Olp M.D.   On: 04/18/2020 14:56   IMPRESSION:  *Recurrent GI bleed with history of AVMs: Enteroscopy earlier this month with one nonbleeding duodenal AVM. 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 Patient refused to prep for colonoscopy x 2 during last hospitalization earlier this month. Now having bloody bowel movements again.  *Anemia: Multifactorial due to chronic kidney disease and recurrent GI bleeding. Hemoglobin stable today at 8.5 g.  *ESRD on HD  *Acute metabolic encephalopathy: Likely secondary to SIRS/right foot gangrene-uremia due to missed HD.  Significantly improved after treatment of SIRS/foot infection with gangrene and dialysis-now back to baseline.  *Right foot gangrene: For angiogram and possible amputation on Monday, May 3.  *History of paroxysmal atrial fibrillation/atrial flutter as well as history of CVA, but not on  anticoagulation due to issues with recurrent GI bleeding.  *Covid positive, patient asymptomatic.  PLAN: *As of now the patient is agreeable to prep for and proceed with colonoscopy.  Will plan for 5/2  with thorough prep (clear liquids the rest of today and tomorrow, 1/2 Miralax prep today, and full Moviprep dose tomorrow) as she was not prepped well at last colonoscopy. *Monitor Hgb and transfuse prn.   Laban Emperor. Terriann Difonzo  04/27/2020, 12:51 PM

## 2020-04-27 NOTE — Progress Notes (Addendum)
PROGRESS NOTE                                                                                                                                                                                                             Patient Demographics:    [name] [name], is a 69 y.o. female, DOB - 1951/10/07, HKV:[id number]  Outpatient Primary MD for the patient is [place] date - 04/21/2020   LOS - 2  Chief Complaint  Patient presents with  . Altered Mental Status       Brief Narrative: Patient is a 69 y.o. female with PMHx of ESRD, PAD-s/p left great toe amputation on 04/11/2020, CAD s/p CABG, DM-2, HTN-who was transferred from her SNF for evaluation of acute metabolic encephalopathy in the setting of gangrenous changes to the right foot.  She was also incidentally found to have COVID-19.  See below for further details  Significant Events: 4/6-4/22>> admit to [facility] for upper GI bleeding and blood loss anemia, left great toe osteo s/p amputation 4/28>> admit to [facility] for acute metabolic encephalopathy 5/64>> bloody stools   COVID-19 medications: Remdesivir: 4/28>> 4/29  Antibiotics: Vancomycin: 4/28>> Zosyn: 4/28>>  Microbiology data: 4/28: Blood culture>> no growth 4/28: Wound swab/superficial culture>> Pseudomonas  DVT prophylaxis: Hold Heparin 4/30 2/2 GI bleed  Procedures: 4/14>> left great toe amputation 4/10>> EGD  Consults: VVS, renal,GI    Subjective:   Had a bloody BM earlier this morning.  Still contemplating about refusing surgery/angiogram scheduled for Monday-Long discussion with patient this morning.   Assessment  & Plan :   Acute metabolic encephalopathy: Likely secondary to SIRS/right foot gangrene-uremia due to missed HD.  Significantly improved after treatment of SIRS/foot infection with gangrene and dialysis-now back to baseline.  SIRS (present on admission) with  right lower extremity critical limb ischemia: Afebrile-SIRS physiology has improved-has gangrenous changes evident on exam-please see pictures taken on 4/28 and prior notes.  Remains on empiric antimicrobial therapy-blood cultures so far negative.  Superficial swab on admission positive for Pseudomonas-not sure how accurate this is.  Plans were for angiogram/possible amputation on 4/29-however patient refused-she is now scheduled for angiogram/amputation this coming Monday.  I again counseled her regarding the importance of commencing with these recommended procedures-she is aware of the risk of worsening infection of her foot causing  more proximal amputations.  I subsequently spoke to her aunt (over the phone) about this issue as well.  History of PAD with recent left great toe amputation-now with nonhealing left foot wound: Vascular surgery planning debridement when she consents for procedure.  Remains on statin-aspirin held due to possible GI bleeding.  Hyperkalemia: Secondary to missed HD-resolved with HD.  Follow periodically.  Recurrent upper GI bleeding-history of AVMs and acute blood loss anemia: Per nursing report-bloody stools this morning (confirmed by this MD).  Hemoglobin stable.  Remains on IV PPI twice daily-stop SQ heparin and aspirin.  Have consulted GI.  Repeat CBC later this afternoon.  ESRD: Nephrology following and directing care.  Anemia: Multifactorial-likely secondary to kidney disease-iron/iron per nephrology.  HTN: BP relatively well controlled-continue metoprolol  DM-2 with hypoglycemia: Hypoglycemic episode earlier this morning-hold Lantus for now-change SSI to a very sensitive scale.  She is n.p.o. for potential vascular surgery procedure later today.  Follow and adjust.  Recent Labs    04/26/20 1629 04/26/20 2035 04/27/20 0744  GLUCAP 77 131* 90   Paroxysmal atrial fibrillation/atrial flutter: Not a new diagnosis-Per discharge summary in October 2020-she also had  atrial fibrillation  as well.  Given recurrent GI bleeding/anemia-not a good candidate for anticoagulation.  Continue rate control with metoprolol.  Chronic combined diastolic and systolic heart failure (EF 45-50% by TTE on 04/28/2019): Volume status stable-diuresis with HD.  History of CVA: Continue statin-see above regarding aspirin and anticoagulation  Gout: Not in flare-continue allopurinol  Covid 19 Viral infection: Appears to be asymptomatic--on room air this morning.  Chest x-ray on admission did not show pneumonia.  Fever: afebrile  O2 requirements:  SpO2: 93 % O2 Flow Rate (L/min): 2 L/min   COVID-19 Labs: Recent Labs    04/27/20 0445  CRP 17.9*       Component Value Date/Time   BNP 897.8 (H) 01/01/2020 0600    No results for input(s): PROCALCITON in the last 168 hours.  Lab Results  Component Value Date   SARSCOV2NAA POSITIVE (A) 04/15/2020   [place] NEGATIVE 04/16/2020   [place] NEGATIVE 04/03/2020   [place] NEGATIVE 03/15/2020     Morbid Obesity: Estimated body mass index is 40.91 kg/m as calculated from the following:   Height as of 04/11/20: 5' 7.99" (1.727 m).   Weight as of this encounter: 122 kg.     ABG:    Component Value Date/Time   PHART 7.419 02/05/2020 1114   PCO2ART 43.0 02/05/2020 1114   PO2ART 85.1 02/05/2020 1114   HCO3 27.2 02/05/2020 1114   TCO2 22 01/01/2020 0614   ACIDBASEDEF 6.0 (H) 01/01/2020 0614   O2SAT 95.3 02/05/2020 1114    Vent Settings: N/A    Condition - Guarded  Family Communication  : Aunt over the phone on 4/30  Code Status :  Full Code  Diet :  Diet Order            Diet renal/carb modified with fluid restriction Diet-HS Snack? Nothing; Fluid restriction: 1200 mL Fluid; Room service appropriate? Yes; Fluid consistency: Thin  Diet effective now               Disposition Plan  :  Status is: Inpatient  Remains inpatient appropriate because:Inpatient level of care appropriate due to  severity of illness   Dispo: The patient is from: SNF              Anticipated d/c is to: SNF  Anticipated d/c date is: > 3 days              Patient currently is not medically stable to d/c.   Barriers to discharge: Right foot gangrene-requiring amputation-remains on IV antibiotics  Antimicorbials  :    Anti-infectives (From admission, onward)   Start     Dose/Rate Route Frequency Ordered Stop   04/26/20 1400  piperacillin-tazobactam (ZOSYN) IVPB 2.25 g     2.25 g 100 mL/hr over 30 Minutes Intravenous Every 8 hours 04/26/20 1314     04/26/20 1000  remdesivir 100 mg in sodium chloride 0.9 % 100 mL IVPB  Status:  Discontinued     100 mg 200 mL/hr over 30 Minutes Intravenous Daily 04/22/2020 1952 04/26/20 1105   04/22/2020 2030  remdesivir 200 mg in sodium chloride 0.9% 250 mL IVPB     200 mg 580 mL/hr over 30 Minutes Intravenous Once 04/12/2020 1952 04/11/2020 2327   04/04/2020 1515  vancomycin (VANCOCIN) 2,500 mg in sodium chloride 0.9 % 500 mL IVPB     2,500 mg 250 mL/hr over 120 Minutes Intravenous  Once 04/09/2020 1513 04/07/2020 2026   04/24/2020 1513  vancomycin variable dose per unstable renal function (pharmacist dosing)      Does not apply See admin instructions 04/27/2020 1514     04/24/2020 1500  piperacillin-tazobactam (ZOSYN) IVPB 3.375 g     3.375 g 100 mL/hr over 30 Minutes Intravenous  Once 04/08/2020 1450 04/18/2020 2026      Inpatient Medications  Scheduled Meds: . allopurinol  100 mg Oral Daily  . aspirin EC  81 mg Oral Daily  . atorvastatin  40 mg Oral q1800  . buPROPion  150 mg Oral BID  . calcitRIOL  2.25 mcg Oral Q T,Th,Sat-1800  . Chlorhexidine Gluconate Cloth  6 each Topical Q0600  . cinacalcet  30 mg Oral Q T,Th,Sat-1800  . gabapentin  300 mg Oral QHS  . heparin injection (subcutaneous)  5,000 Units Subcutaneous Q8H  . insulin aspart  0-6 Units Subcutaneous TID WC  . linaclotide  72 mcg Oral QAC breakfast  . loratadine  10 mg Oral Daily  . metoprolol  tartrate  25 mg Oral BID  . pantoprazole  40 mg Oral BID  . sodium chloride flush  3 mL Intravenous Q12H  . sucroferric oxyhydroxide  1,000 mg Oral TID WC  . vancomycin variable dose per unstable renal function (pharmacist dosing)   Does not apply See admin instructions   Continuous Infusions: . sodium chloride    . sodium chloride    . sodium chloride    . piperacillin-tazobactam (ZOSYN)  IV 2.25 g (04/27/20 1033)   PRN Meds:.sodium chloride, sodium chloride, sodium chloride, acetaminophen, albuterol, alteplase, heparin, lidocaine (PF), lidocaine-prilocaine, metoprolol tartrate, mometasone-formoterol, nitroGLYCERIN, oxyCODONE, pentafluoroprop-tetrafluoroeth, polyethylene glycol, sodium chloride flush   Time Spent in minutes 35   See all Orders from today for further details   Oren Binet M.D on 04/27/2020 at 11:23 AM  To page go to [email or web address] - use universal password  [facility] -  Office  [phone]    Objective:   Vitals:   04/26/20 1500 04/27/20 0100 04/27/20 0600 04/27/20 0746  BP:  (!) 133/57 (!) 131/91 (!) 141/54  Pulse: 86 84 86 70  Resp: 17 15 19 16   Temp:  98 F (36.7 C) 98.5 F (36.9 C)   TempSrc:  Axillary Axillary   SpO2: 97% 95% 96% 93%  Weight:        Wt  Readings from Last 3 Encounters:  04/02/2020 122 kg  04/19/20 118.4 kg  03/02/20 120.2 kg     Intake/Output Summary (Last 24 hours) at 04/27/2020 1123 Last data filed at 04/27/2020 1002 Gross per 24 hour  Intake 530.08 ml  Output --  Net 530.08 ml     Physical Exam Gen Exam:Alert awake-not in any distress HEENT:atraumatic, normocephalic Chest: B/L clear to auscultation anteriorly CVS:S1S2 regular Abdomen:soft non tender, non distended Extremities:no edema Neurology: Non focal Skin: no rash   Data Review:    CBC Recent Labs  Lab 04/22/2020 1315 04/26/20 1630 04/27/20 0445  WBC 7.9 7.7 8.0  HGB 8.8* 8.2* 8.5*  HCT 29.0* 26.4* 28.4*  PLT 416* 325 315  MCV 86.1 86.0  86.9  MCH 26.1 26.7 26.0  MCHC 30.3 31.1 29.9*  RDW 18.9* 18.9* 19.0*  LYMPHSABS 1.3  --   --   MONOABS 0.7  --   --   EOSABS 0.0  --   --   BASOSABS 0.1  --   --     Chemistries  Recent Labs  Lab 04/22/2020 1315 04/26/20 0500 04/26/20 1630 04/27/20 0445  NA 138 139 142 139  K 6.6* 4.4 4.8 4.5  CL 97* 100 101 100  CO2 22 25 25 22   GLUCOSE 89 161* 79 69*  BUN 81* 36* 41* 49*  CREATININE 11.32* 6.71* 7.61* 8.44*  CALCIUM 8.2* 8.0* 7.7* 7.3*  AST 21  --   --   --   ALT 7  --   --   --   ALKPHOS 102  --   --   --   BILITOT 0.7  --   --   --    ------------------------------------------------------------------------------------------------------------------ No results for input(s): CHOL, HDL, LDLCALC, TRIG, CHOLHDL, LDLDIRECT in the last 72 hours.  Lab Results  Component Value Date   HGBA1C 5.1 04/03/2020   ------------------------------------------------------------------------------------------------------------------ No results for input(s): TSH, T4TOTAL, T3FREE, THYROIDAB in the last 72 hours.  Invalid input(s): FREET3 ------------------------------------------------------------------------------------------------------------------ No results for input(s): VITAMINB12, FOLATE, FERRITIN, TIBC, IRON, RETICCTPCT in the last 72 hours.  Coagulation profile Recent Labs  Lab 04/18/2020 1315  INR 1.3*    No results for input(s): DDIMER in the last 72 hours.  Cardiac Enzymes No results for input(s): CKMB, TROPONINI, MYOGLOBIN in the last 168 hours.  Invalid input(s): CK ------------------------------------------------------------------------------------------------------------------    Component Value Date/Time   BNP 897.8 (H) 01/01/2020 0600    Micro Results Recent Results (from the past 240 hour(s))  Culture, blood (Routine x 2)     Status: None (Preliminary result)   Collection Time: 04/13/2020  1:00 PM   Specimen: BLOOD  Result Value Ref Range Status   Specimen  Description BLOOD RIGHT ANTECUBITAL  Final   Special Requests   Final    BOTTLES DRAWN AEROBIC AND ANAEROBIC Blood Culture results may not be optimal due to an inadequate volume of blood received in culture bottles   Culture   Final    NO GROWTH < 24 HOURS Performed at [facility], [place] [address], [place], [place] [address]    Report Status PENDING  Incomplete  Culture, blood (Routine x 2)     Status: None (Preliminary result)   Collection Time: 04/17/2020  1:20 PM   Specimen: BLOOD RIGHT HAND  Result Value Ref Range Status   Specimen Description BLOOD RIGHT HAND  Final   Special Requests   Final    BOTTLES DRAWN AEROBIC ONLY Blood Culture results may  not be optimal due to an inadequate volume of blood received in culture bottles   Culture   Final    NO GROWTH < 24 HOURS Performed at [facility] [address], [place], [place] [address]    Report Status PENDING  Incomplete  Respiratory Panel by RT PCR (Flu A&B, Covid) - Nasopharyngeal Swab     Status: Abnormal   Collection Time: 04/05/2020  3:58 PM   Specimen: Nasopharyngeal Swab  Result Value Ref Range Status   SARS Coronavirus 2 by RT PCR POSITIVE (A) NEGATIVE Final    Comment: RESULT CALLED TO, READ BACK BY AND VERIFIED WITH: [facility] RN 04/20/2020 1751 JDW (NOTE) SARS-CoV-2 target nucleic acids are DETECTED. SARS-CoV-2 RNA is generally detectable in upper respiratory specimens  during the acute phase of infection. Positive results are indicative of the presence of the identified virus, but do not rule out bacterial infection or co-infection with other pathogens not detected by the test. Clinical correlation with patient history and other diagnostic information is necessary to determine patient infection status. The expected result is Negative. Fact Sheet for Patients:  PinkCheek.be Fact Sheet for Healthcare Providers: GravelBags.it This test is not yet  approved or cleared by the [place] FDA and  has been authorized for detection and/or diagnosis of SARS-CoV-2 by FDA under an Emergency Use Authorization (EUA).  This EUA will remain in effect (meaning this test can be used) for th e duration of  the COVID-19 declaration under Section 564(b)(1) of the Act, 21 U.S.C. section 360bbb-3(b)(1), unless the authorization is terminated or revoked sooner.    Influenza A by PCR NEGATIVE NEGATIVE Final   Influenza B by PCR NEGATIVE NEGATIVE Final    Comment: (NOTE) The Xpert Xpress SARS-CoV-2/FLU/RSV assay is intended as an aid in  the diagnosis of influenza from Nasopharyngeal swab specimens and  should not be used as a sole basis for treatment. Nasal washings and  aspirates are unacceptable for Xpert Xpress SARS-CoV-2/FLU/RSV  testing. Fact Sheet for Patients: PinkCheek.be Fact Sheet for Healthcare Providers: GravelBags.it This test is not yet approved or cleared by the [place] FDA and  has been authorized for detection and/or diagnosis of SARS-CoV-2 by  FDA under an Emergency Use Authorization (EUA). This EUA will remain  in effect (meaning this test can be used) for the duration of the  Covid-19 declaration under Section 564(b)(1) of the Act, 21  U.S.C. section 360bbb-3(b)(1), unless the authorization is  terminated or revoked. Performed at [facility], [place] [address] [place], [place] [address]   Wound or Superficial Culture     Status: None (Preliminary result)   Collection Time: 04/13/2020  8:43 PM   Specimen: Toe; Wound  Result Value Ref Range Status   Specimen Description TOE  Final   Special Requests RIGHT  Final   Gram Stain NO WBC SEEN NO ORGANISMS SEEN   Final   Culture   Final    CULTURE REINCUBATED FOR BETTER GROWTH Performed at [facility], [address], [place], [place] [address]    Report Status PENDING  Incomplete    Radiology  Reports DG Chest 2 View  Result Date: 04/12/2020 CLINICAL DATA:  69 year old female with suspected sepsis. Bilateral foot infection for the past 3 weeks. EXAM: CHEST - 2 VIEW COMPARISON:  Chest x-ray 04/04/2020. FINDINGS: Lung volumes are normal. No consolidative airspace disease. No pleural effusions. No evidence of frank pulmonary edema. Mild cephalization of the pulmonary vasculature. Mild  cardiomegaly. Upper mediastinal contours are within normal limits. Aortic atherosclerosis. Status post median sternotomy for CABG. Right internal jugular PermCath with tip terminating in the mid to distal superior vena cava. Aortic atherosclerosis. IMPRESSION: 1. Postoperative changes and support apparatus, as above. 2. Mild cardiomegaly with pulmonary venous congestion, without frank pulmonary edema. 3. Aortic atherosclerosis. Electronically Signed   By: Vinnie Langton M.D.   On: 03/31/2020 14:52   CT FOOT RIGHT WO CONTRAST  Result Date: 04/12/2020 CLINICAL DATA:  Pain and swelling. EXAM: CT OF THE RIGHT FOOT WITHOUT CONTRAST TECHNIQUE: Multidetector CT imaging of the right foot was performed according to the standard protocol. Multiplanar CT image reconstructions were also generated. COMPARISON:  Radiographs 04/09/2020 FINDINGS: Diffuse subcutaneous soft tissue swelling/edema/fluid involving the entire ankle and foot. No focal fluid collection to suggest a drainable soft tissue abscess. Marked fatty atrophy of the foot and ankle musculature. Suspect myofasciitis involving the plantar foot musculature but no definite findings for pyomyositis. Suspect an open wound along the plantar aspect of the great toe but I do not see any obvious destructive bony changes to suggest osteomyelitis. No definite findings for septic arthritis. The other bony structures of the foot and ankle appear intact. No obvious destructive bony changes, fractures or osteochondral lesions. Extensive small artery calcifications. IMPRESSION: 1.  Diffuse subcutaneous soft tissue swelling/edema/fluid involving the entire ankle and foot consistent with cellulitis. No focal fluid collection to suggest a drainable soft tissue abscess. 2. Suspect myofasciitis involving the plantar foot musculature but no definite findings for pyomyositis. Severe diffuse fatty atrophy of the foot and ankle musculature. 3. Suspect an open wound along the plantar aspect of the great toe but I do not see any obvious destructive bony changes to suggest osteomyelitis. No definite findings for septic arthritis. 4. If symptoms persist or worsen MRI is suggested for further evaluation. Electronically Signed   By: Marijo Sanes M.D.   On: 04/12/2020 10:51   DG CHEST PORT 1 VIEW  Result Date: 04/04/2020 CLINICAL DATA:  69 year old female with fever. EXAM: PORTABLE CHEST 1 VIEW COMPARISON:  Chest radiograph dated 02/24/2020. FINDINGS: Right-sided dialysis catheter in similar position. There is cardiomegaly with mild vascular congestion. No focal consolidation, pleural effusion or pneumothorax. Median sternotomy wires and CABG vascular clips. No acute osseous pathology. IMPRESSION: Cardiomegaly with mild vascular congestion. No focal consolidation. Electronically Signed   By: Anner Crete M.D.   On: 04/04/2020 17:29   DG ABD ACUTE 2+V W 1V CHEST  Result Date: 04/09/2020 CLINICAL DATA:  Fever with low hemoglobin. EXAM: DG ABDOMEN ACUTE W/ 1V CHEST COMPARISON:  Abdominal film 06/26/2018 and chest x-ray 02/24/2020 FINDINGS: Right IJ central venous catheter unchanged with tip over the SVC. Lungs are adequately inflated with minimal hazy prominence of the perihilar vessels unchanged and likely due to mild chronic vascular congestion. No lobar consolidation or effusion. Mild stable cardiomegaly. Remainder the chest is unchanged. There are several air-filled large and small bowel loops. A few air-filled small bowel loops at the upper limits of normal in diameter over the left abdomen. No  evidence of air-fluid levels or free peritoneal air. Remainder of the exam is unchanged. IMPRESSION: Nonspecific, nonobstructive bowel gas pattern with a few air-filled mildly prominent, but nondilated small bowel loops in the left abdomen. Stable mild cardiomegaly with suggestion of minimal chronic vascular congestion. Electronically Signed   By: Marin Olp M.D.   On: 04/09/2020 09:57   DG Foot Complete Left  Result Date: 04/21/2020 CLINICAL DATA:  Recent amputation.  Suspect sepsis. Bilateral foot infection 3 weeks. EXAM: LEFT FOOT - COMPLETE 3+ VIEW COMPARISON:  04/09/2020 FINDINGS: There is generalized decreased bone mineralization. Evidence of patient's recent amputation distal to the distal shaft of the first metatarsal. Expected postsurgical changes at the amputation site. No focal bone destruction to suggest osteomyelitis. No air within the soft tissues. Exam is otherwise unchanged. IMPRESSION: Expected changes post amputation distal to the distal aspect of the first metatarsal. No definite bone destruction to suggest osteomyelitis. No air within the soft tissues. Electronically Signed   By: Marin Olp M.D.   On: 04/21/2020 14:58   DG Foot Complete Left  Result Date: 04/09/2020 CLINICAL DATA:  Foot pain and swelling EXAM: LEFT FOOT - COMPLETE 3+ VIEW COMPARISON:  02/01/2020 MRI, 01/31/2020 plain film FINDINGS: There is been interval amputation of the first toe. Persistent soft tissue wound is seen. There is lucency identified in the head of the first metatarsal suspicious for recurrent osteomyelitis. No other fracture or dislocation is seen. No other soft tissue abnormality is noted. IMPRESSION: Changes suspicious for osteomyelitis in the head of the first metatarsal. Overlying soft tissue wound is noted. Electronically Signed   By: Inez Catalina M.D.   On: 04/09/2020 19:40   DG Foot Complete Right  Result Date: 04/09/2020 CLINICAL DATA:  Recent amputation with possible infection three weeks.  Suspect sepsis. EXAM: RIGHT FOOT COMPLETE - 3+ VIEW COMPARISON:  CT 04/12/2020 FINDINGS: Diffuse decreased bone mineralization. Degenerative change over the first MTP joint and interphalangeal joints. Mild horizontal sclerosis with subtle lucency involving the head of the fifth metatarsal possibly subacute fracture. No focal bone destruction to suggest osteomyelitis. No air within the soft tissues. IMPRESSION: 1. No definite evidence of osteomyelitis and no air within the soft tissues. 2. Linear sclerosis with subtle lucency involving the head of the fifth metatarsal which may be due to nondisplaced subacute fracture. Electronically Signed   By: Marin Olp M.D.   On: 04/08/2020 14:56   VAS [place] LOWER EXTREMITY VENOUS (DVT)  Result Date: 04/10/2020  Lower Venous DVTStudy Indications: Edema, and fever.  Limitations: Body habitus and poor ultrasound/tissue interface. Comparison Study: 04/28/19 previous Performing Technologist: Abram Sander RVS  Examination Guidelines: A complete evaluation includes B-mode imaging, spectral Doppler, color Doppler, and power Doppler as needed of all accessible portions of each vessel. Bilateral testing is considered an integral part of a complete examination. Limited examinations for reoccurring indications may be performed as noted. The reflux portion of the exam is performed with the patient in reverse Trendelenburg.  +---------+---------------+---------+-----------+----------+--------------+ RIGHT    CompressibilityPhasicitySpontaneityPropertiesThrombus Aging +---------+---------------+---------+-----------+----------+--------------+ CFV      Full           Yes      Yes                                 +---------+---------------+---------+-----------+----------+--------------+ SFJ      Full                                                        +---------+---------------+---------+-----------+----------+--------------+ FV Prox  Full                                                         +---------+---------------+---------+-----------+----------+--------------+  FV Mid   Full                                                        +---------+---------------+---------+-----------+----------+--------------+ FV DistalFull                                                        +---------+---------------+---------+-----------+----------+--------------+ PFV      Full                                                        +---------+---------------+---------+-----------+----------+--------------+ POP      Full           Yes      Yes                                 +---------+---------------+---------+-----------+----------+--------------+ PTV      Full                                                        +---------+---------------+---------+-----------+----------+--------------+ PERO                                                  Not visualized +---------+---------------+---------+-----------+----------+--------------+   +---------+---------------+---------+-----------+----------+--------------+ LEFT     CompressibilityPhasicitySpontaneityPropertiesThrombus Aging +---------+---------------+---------+-----------+----------+--------------+ CFV      Full           Yes      Yes                                 +---------+---------------+---------+-----------+----------+--------------+ SFJ      Full                                                        +---------+---------------+---------+-----------+----------+--------------+ FV Prox  Full                                                        +---------+---------------+---------+-----------+----------+--------------+ FV Mid   Full                                                        +---------+---------------+---------+-----------+----------+--------------+  FV DistalFull                                                         +---------+---------------+---------+-----------+----------+--------------+ PFV      Full                                                        +---------+---------------+---------+-----------+----------+--------------+ POP      Full           Yes      Yes                                 +---------+---------------+---------+-----------+----------+--------------+ PTV      Full                                                        +---------+---------------+---------+-----------+----------+--------------+ PERO                                                  Not visualized +---------+---------------+---------+-----------+----------+--------------+     Summary: BILATERAL: - No evidence of deep vein thrombosis seen in the lower extremities, bilaterally.   *See table(s) above for measurements and observations. Electronically signed by Deitra Mayo MD on 04/10/2020 at 3:24:49 PM.    Final

## 2020-04-28 DIAGNOSIS — U071 COVID-19: Secondary | ICD-10-CM | POA: Diagnosis not present

## 2020-04-28 DIAGNOSIS — Z992 Dependence on renal dialysis: Secondary | ICD-10-CM | POA: Diagnosis not present

## 2020-04-28 DIAGNOSIS — N186 End stage renal disease: Secondary | ICD-10-CM | POA: Diagnosis not present

## 2020-04-28 DIAGNOSIS — K5520 Angiodysplasia of colon without hemorrhage: Secondary | ICD-10-CM | POA: Diagnosis not present

## 2020-04-28 LAB — GLUCOSE, CAPILLARY
Glucose-Capillary: 62 mg/dL — ABNORMAL LOW (ref 70–99)
Glucose-Capillary: 148 mg/dL — ABNORMAL HIGH (ref 70–99)
Glucose-Capillary: 96 mg/dL (ref 70–99)
Glucose-Capillary: 117 mg/dL — ABNORMAL HIGH (ref 70–99)

## 2020-04-28 LAB — CBC WITH DIFFERENTIAL/PLATELET
Abs Immature Granulocytes: 0.07 10*3/uL (ref 0.00–0.07)
Basophils Absolute: 0 10*3/uL (ref 0.0–0.1)
Basophils Relative: 1 %
Eosinophils Absolute: 0.1 10*3/uL (ref 0.0–0.5)
Eosinophils Relative: 1 %
HCT: 25 % — ABNORMAL LOW (ref 36.0–46.0)
Hemoglobin: 7.7 g/dL — ABNORMAL LOW (ref 12.0–15.0)
Immature Granulocytes: 1 %
Lymphocytes Relative: 17 %
Lymphs Abs: 1.3 10*3/uL (ref 0.7–4.0)
MCH: 26 pg (ref 26.0–34.0)
MCHC: 30.8 g/dL (ref 30.0–36.0)
MCV: 84.5 fL (ref 80.0–100.0)
Monocytes Absolute: 0.6 10*3/uL (ref 0.1–1.0)
Monocytes Relative: 8 %
Neutro Abs: 5.6 10*3/uL (ref 1.7–7.7)
Neutrophils Relative %: 72 %
Platelets: 313 10*3/uL (ref 150–400)
RBC: 2.96 MIL/uL — ABNORMAL LOW (ref 3.87–5.11)
RDW: 19 % — ABNORMAL HIGH (ref 11.5–15.5)
WBC: 7.6 10*3/uL (ref 4.0–10.5)
nRBC: 0 % (ref 0.0–0.2)

## 2020-04-28 LAB — AEROBIC CULTURE W GRAM STAIN (SUPERFICIAL SPECIMEN): Gram Stain: NONE SEEN

## 2020-04-28 LAB — RENAL FUNCTION PANEL
Albumin: 2 g/dL — ABNORMAL LOW (ref 3.5–5.0)
Anion gap: 18 — ABNORMAL HIGH (ref 5–15)
BUN: 57 mg/dL — ABNORMAL HIGH (ref 8–23)
CO2: 23 mmol/L (ref 22–32)
Calcium: 7.4 mg/dL — ABNORMAL LOW (ref 8.9–10.3)
Chloride: 101 mmol/L (ref 98–111)
Creatinine, Ser: 9.73 mg/dL — ABNORMAL HIGH (ref 0.44–1.00)
GFR calc Af Amer: 4 mL/min — ABNORMAL LOW (ref >60)
GFR calc non Af Amer: 4 mL/min — ABNORMAL LOW (ref >60)
Glucose, Bld: 75 mg/dL (ref 70–99)
Phosphorus: 6.9 mg/dL — ABNORMAL HIGH (ref 2.5–4.6)
Potassium: 4.5 mmol/L (ref 3.5–5.1)
Sodium: 142 mmol/L (ref 135–145)

## 2020-04-28 MED ORDER — SODIUM CHLORIDE 0.9 % IV BOLUS
250.0000 mL | Freq: Once | INTRAVENOUS | Status: AC
  Administered 2020-04-28: 250 mL via INTRAVENOUS

## 2020-04-28 MED ORDER — DARBEPOETIN ALFA 150 MCG/0.3ML IJ SOSY
150.0000 ug | PREFILLED_SYRINGE | INTRAMUSCULAR | Status: DC
  Administered 2020-04-28: 150 ug via INTRAVENOUS
  Filled 2020-04-28 (×2): qty 0.3

## 2020-04-28 NOTE — Progress Notes (Signed)
Patient ID: [name] [name], female   DOB: 04/12/1951, 69 y.o.   MRN: [id number]  [facility] Progress Note   Assessment/ Plan:   1.  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 2.  Rectal bleeding: Prior history of arteriovenous malformations and seen by gastroenterology with plans for colon prep and colonoscopy tomorrow. 3. ESRD: Continue routine hemodialysis on a TTS schedule with dialysis today.  Patient tolerating the procedure with adjustment of ultrafiltration. 4. Anemia: Acute blood loss anemia superimposed on anemia of ESRD/chronic kidney disease.  Hemodynamically stable and will redose ESA. 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 6. Nutrition: Continue renal diet with fluid restriction and nutritional supplementation to improve wound healing. 7. Hypertension: Blood pressures marginally elevated, evidence of volume overload and will continue efforts at lowering dry weight.  Subjective:   With GI bleed yesterday for which she was evaluated by gastroenterology: Past history of AVMs.  She reports to be feeling fair when seen on dialysis today.   Objective:   BP (!) 155/55 (BP Location: Right Arm)   Pulse 73   Temp 99 F (37.2 C) (Oral)   Resp 16   Wt 119.1 kg   SpO2 91%   BMI 39.93 kg/m   Physical Exam: Gen: Comfortably resting on dialysis CVS: Pulse regular rhythm, normal rate, 3/6 ejection systolic murmur. Resp: Diminished breath sounds over bases, no distinct rales or rhonchi Abd: Soft, obese, mild tenderness over epigastric area.  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  Labs: BMET Recent Labs  Lab 04/15/2020 1315 04/26/20 0500 04/26/20 1630 04/27/20 0445 04/28/20 0719  NA 138 139 142 139 142  K 6.6* 4.4 4.8 4.5 4.5  CL 97* 100 101 100 101  CO2 _0 GLUCOSE 89 161* 79 69* 75  BUN 81* 36* 41* 49* 57*  CREATININE 11.32* 6.71* 7.61* 8.44* 9.73*  CALCIUM 8.2* 8.0* 7.7* 7.3* 7.4*  PHOS  --   --  5.6* 5.8* 6.9*   CBC Recent Labs  Lab 04/13/2020 1315 04/24/2020 1315 04/26/20 1630 04/27/20 0445 04/27/20 1343 04/28/20 0719  WBC 7.9   < > 7.7 8.0 7.8 7.6  NEUTROABS 5.8  --   --   --   --  5.6  HGB 8.8*   < > 8.2* 8.5* 8.6* 7.7*  HCT 29.0*   < > 26.4* 28.4* 28.3* 25.0*  MCV 86.1   < > 86.0 86.9 86.5 84.5  PLT 416*   < > 325 315 322 313   < > = values in this interval not displayed.     Medications:    . allopurinol  100 mg Oral Daily  . atorvastatin  40 mg Oral q1800  . buPROPion  150 mg Oral BID  . calcitRIOL  2.25 mcg Oral Q T,Th,Sat-1800  . Chlorhexidine Gluconate Cloth  6 each Topical Q0600  . cinacalcet  30 mg Oral Q T,Th,Sat-1800  . gabapentin  300 mg Oral QHS  . insulin aspart  0-6 Units Subcutaneous TID WC  . linaclotide  72 mcg Oral QAC breakfast  . loratadine  10 mg Oral Daily  . metoprolol tartrate  25 mg Oral BID  .  pantoprazole (PROTONIX) IV  40 mg Intravenous Q12H  . peg 3350 powder  0.5 kit Oral Once   And  . peg 3350 powder  0.5 kit Oral Once  . sodium chloride flush  3 mL Intravenous Q12H  . sucroferric oxyhydroxide  1,000 mg Oral TID WC  . vancomycin variable dose per unstable renal function (pharmacist dosing)   Does not apply See admin instructions   Jay Patel, MD 04/28/2020, 11:15 AM   

## 2020-04-28 NOTE — Procedures (Signed)
Patient seen on Hemodialysis. BP (!) 155/55 (BP Location: Right Arm)   Pulse 73   Temp 99 F (37.2 C) (Oral)   Resp 16   Wt 119.1 kg   SpO2 91%   BMI 39.93 kg/m   QB 400, UF goal 2.2L Tolerating treatment without complaints at this time.   Elmarie Shiley MD [facility]. Office # [phone] Pager # [phone] 11:15 AM

## 2020-04-28 NOTE — Progress Notes (Signed)
Pts BP 70/49 (57).  On-call [facility] paged.

## 2020-04-28 NOTE — Progress Notes (Addendum)
PROGRESS NOTE                                                                                                                                                                                                             Patient Demographics:    [name] [name], is a 69 y.o. female, DOB - 1951/08/25, LMB:[id number]  Outpatient Primary MD for the patient is [place] date - 04/18/2020   LOS - 3  Chief Complaint  Patient presents with  . Altered Mental Status       Brief Narrative: Patient is a 69 y.o. female with PMHx of ESRD, PAD-s/p left great toe amputation on 04/11/2020, CAD s/p CABG, DM-2, HTN-who was transferred from her SNF for evaluation of acute metabolic encephalopathy in the setting of gangrenous changes to the right foot.  She was also incidentally found to have COVID-19.  See below for further details  Significant Events: 4/6-4/22>> admit to [facility] for upper GI bleeding and blood loss anemia, left great toe osteo s/p amputation 4/28>> admit to [facility] for acute metabolic encephalopathy 1/00>> bloody stools   COVID-19 medications: Remdesivir: 4/28>> 4/29  Antibiotics: Vancomycin: 4/28>> Zosyn: 4/28>>  Microbiology data: 4/28: Blood culture>> no growth 4/28: Wound swab/superficial culture>> Pseudomonas  DVT prophylaxis: Hold Heparin 4/30 2/2 GI bleed  Procedures: 4/14>> left great toe amputation 4/10>> EGD  Consults: VVS, renal,GI    Subjective:   Lying comfortably in bed-on room air-no bloody BM today.   Assessment  & Plan :   Acute metabolic encephalopathy: Likely secondary to SIRS/right foot gangrene-uremia due to missed HD.  Significantly improved after treatment of SIRS/foot infection with gangrene and dialysis-now back to baseline.  SIRS (present on admission) with right lower extremity critical limb ischemia: Afebrile-SIRS physiology has improved-has gangrenous  changes evident on exam-please see pictures taken on 4/28 and prior notes.  Blood cultures negative so far.    Superficial swab on admission positive for Pseudomonas-not sure how accurate this is.  We will discontinue vancomycin today-and just continue on Zosyn.  Plans were for angiogram/possible amputation on 4/29-however patient refused-she is now scheduled for angiogram/amputation this coming Monday.  I again counseled her regarding the importance of commencing with these recommended procedures-she is aware of the risk of worsening infection of her foot causing more proximal amputations.  History of PAD with recent left great toe amputation-now with nonhealing left foot wound: Vascular surgery planning debridement when she consents for procedure.  Remains on statin-aspirin held due to possible GI bleeding.  Hyperkalemia: Secondary to missed HD-resolved with HD.  Follow periodically.  Recurrent upper GI bleeding-history of AVMs and acute blood loss anemia: No bloody BM today-but had bloody BM on 4/30-mild drop in hemoglobin-likely secondary equilibration.  GI following with plans for colonoscopy on 5/2.  Remains on PPI.  Subcu heparin and aspirin on hold.  Repeat CBC tomorrow.  ESRD: Nephrology following and directing care.  Anemia: Multifactorial-likely secondary to kidney disease-iron/iron per nephrology.  HTN: BP relatively well controlled-continue metoprolol  DM-2 with hypoglycemia (A1C 5.1 on 04/03/2020): Continues to have hypoglycemic episodes-stop sliding scale insulin and follow.  Recent Labs    04/27/20 1535 04/27/20 2036 04/28/20 0824  GLUCAP 98 170* 62*   Paroxysmal atrial fibrillation/atrial flutter: Not a new diagnosis-Per discharge summary in October 2020-she also had atrial fibrillation  as well.  Given recurrent GI bleeding/anemia-not a good candidate for anticoagulation.  Continue rate control with metoprolol.  Chronic combined diastolic and systolic heart failure (EF 45-50%  by TTE on 04/28/2019): Volume status stable-diuresis with HD.  History of CVA: Continue statin-see above regarding aspirin and anticoagulation  Gout: Not in flare-continue allopurinol  Covid 19 Viral infection: Appears to be asymptomatic--on room air this morning.  Chest x-ray on admission did not show pneumonia.  Fever: afebrile  O2 requirements:  SpO2: 91 % O2 Flow Rate (L/min): 2 L/min   COVID-19 Labs: Recent Labs    04/27/20 0445  CRP 17.9*       Component Value Date/Time   BNP 897.8 (H) 01/01/2020 0600    No results for input(s): PROCALCITON in the last 168 hours.  Lab Results  Component Value Date   SARSCOV2NAA POSITIVE (A) 03/30/2020   [place] NEGATIVE 04/16/2020   [place] NEGATIVE 04/03/2020   [place] NEGATIVE 03/15/2020     Morbid Obesity: Estimated body mass index is 39.93 kg/m as calculated from the following:   Height as of 04/11/20: 5' 7.99" (1.727 m).   Weight as of this encounter: 119.1 kg.     ABG:    Component Value Date/Time   PHART 7.419 02/05/2020 1114   PCO2ART 43.0 02/05/2020 1114   PO2ART 85.1 02/05/2020 1114   HCO3 27.2 02/05/2020 1114   TCO2 22 01/01/2020 0614   ACIDBASEDEF 6.0 (H) 01/01/2020 0614   O2SAT 95.3 02/05/2020 1114    Vent Settings: N/A    Condition - Guarded  Family Communication  : Aunt over the phone on 5/1  Code Status :  Full Code  Diet :  Diet Order            Diet NPO time specified  Diet effective midnight        Diet clear liquid Room service appropriate? Yes; Fluid consistency: Thin  Diet effective now               Disposition Plan  :  Status is: Inpatient  Remains inpatient appropriate because:Inpatient level of care appropriate due to severity of illness   Dispo: The patient is from: SNF              Anticipated d/c is to: SNF              Anticipated d/c date is: > 3 days              Patient currently  is not medically stable to d/c.   Barriers to discharge: Right foot  gangrene-requiring amputation-remains on IV antibiotics  Antimicorbials  :    Anti-infectives (From admission, onward)   Start     Dose/Rate Route Frequency Ordered Stop   04/28/20 1200  vancomycin (VANCOCIN) IVPB 1000 mg/200 mL premix     1,000 mg 200 mL/hr over 60 Minutes Intravenous Every T-Th-Sa (Hemodialysis) 04/27/20 1154 04/29/2020 1159   04/26/20 1400  piperacillin-tazobactam (ZOSYN) IVPB 2.25 g     2.25 g 100 mL/hr over 30 Minutes Intravenous Every 8 hours 04/26/20 1314     04/26/20 1000  remdesivir 100 mg in sodium chloride 0.9 % 100 mL IVPB  Status:  Discontinued     100 mg 200 mL/hr over 30 Minutes Intravenous Daily 04/27/2020 1952 04/26/20 1105   03/30/2020 2030  remdesivir 200 mg in sodium chloride 0.9% 250 mL IVPB     200 mg 580 mL/hr over 30 Minutes Intravenous Once 04/12/2020 1952 04/01/2020 2327   03/30/2020 1515  vancomycin (VANCOCIN) 2,500 mg in sodium chloride 0.9 % 500 mL IVPB     2,500 mg 250 mL/hr over 120 Minutes Intravenous  Once 04/24/2020 1513 04/22/2020 2026   03/30/2020 1513  vancomycin variable dose per unstable renal function (pharmacist dosing)      Does not apply See admin instructions 04/03/2020 1514     04/03/2020 1500  piperacillin-tazobactam (ZOSYN) IVPB 3.375 g     3.375 g 100 mL/hr over 30 Minutes Intravenous  Once 04/18/2020 1450 04/14/2020 2026      Inpatient Medications  Scheduled Meds: . allopurinol  100 mg Oral Daily  . atorvastatin  40 mg Oral q1800  . buPROPion  150 mg Oral BID  . calcitRIOL  2.25 mcg Oral Q T,Th,Sat-1800  . Chlorhexidine Gluconate Cloth  6 each Topical Q0600  . cinacalcet  30 mg Oral Q T,Th,Sat-1800  . darbepoetin (ARANESP) injection - DIALYSIS  150 mcg Intravenous Q Sat-HD  . gabapentin  300 mg Oral QHS  . insulin aspart  0-6 Units Subcutaneous TID WC  . linaclotide  72 mcg Oral QAC breakfast  . loratadine  10 mg Oral Daily  . metoprolol tartrate  25 mg Oral BID  . pantoprazole (PROTONIX) IV  40 mg Intravenous Q12H  . peg 3350  powder  0.5 kit Oral Once   And  . peg 3350 powder  0.5 kit Oral Once  . sodium chloride flush  3 mL Intravenous Q12H  . sucroferric oxyhydroxide  1,000 mg Oral TID WC  . vancomycin variable dose per unstable renal function (pharmacist dosing)   Does not apply See admin instructions   Continuous Infusions: . sodium chloride    . sodium chloride    . sodium chloride    . piperacillin-tazobactam (ZOSYN)  IV 2.25 g (04/28/20 0230)  . vancomycin     PRN Meds:.sodium chloride, sodium chloride, sodium chloride, acetaminophen, albuterol, alteplase, heparin, lidocaine (PF), lidocaine-prilocaine, metoprolol tartrate, mometasone-formoterol, nitroGLYCERIN, oxyCODONE, pentafluoroprop-tetrafluoroeth, polyethylene glycol, sodium chloride flush   Time Spent in minutes 35   See all Orders from today for further details   Oren Binet M.D on 04/28/2020 at 12:15 PM  To page go to [email or web address] - use universal password  [facility] -  Office  [phone]    Objective:   Vitals:   04/28/20 1000 04/28/20 1030 04/28/20 1100 04/28/20 1131  BP: (!) 114/43 (!) 155/55 (!) 142/43 133/65  Pulse: 67 73 68 73  Resp: '17 16 17 ' 17  Temp:      TempSrc:      SpO2:      Weight:        Wt Readings from Last 3 Encounters:  04/28/20 119.1 kg  04/19/20 118.4 kg  03/02/20 120.2 kg     Intake/Output Summary (Last 24 hours) at 04/28/2020 1215 Last data filed at 04/27/2020 1350 Gross per 24 hour  Intake 240 ml  Output --  Net 240 ml     Physical Exam Gen Exam:Alert awake-not in any distress HEENT:atraumatic, normocephalic Chest: B/L clear to auscultation anteriorly CVS:S1S2 regular Abdomen:soft non tender, non distended Extremities:trace edema Neurology: Non focal Skin: no rash   Data Review:    CBC Recent Labs  Lab 04/07/2020 1315 04/26/20 1630 04/27/20 0445 04/27/20 1343 04/28/20 0719  WBC 7.9 7.7 8.0 7.8 7.6  HGB 8.8* 8.2* 8.5* 8.6* 7.7*  HCT 29.0* 26.4* 28.4* 28.3* 25.0*   PLT 416* 325 315 322 313  MCV 86.1 86.0 86.9 86.5 84.5  MCH 26.1 26.7 26.0 26.3 26.0  MCHC 30.3 31.1 29.9* 30.4 30.8  RDW 18.9* 18.9* 19.0* 18.9* 19.0*  LYMPHSABS 1.3  --   --   --  1.3  MONOABS 0.7  --   --   --  0.6  EOSABS 0.0  --   --   --  0.1  BASOSABS 0.1  --   --   --  0.0    Chemistries  Recent Labs  Lab 04/16/2020 1315 04/26/20 0500 04/26/20 1630 04/27/20 0445 04/28/20 0719  NA 138 139 142 139 142  K 6.6* 4.4 4.8 4.5 4.5  CL 97* 100 101 100 101  CO2 '22 25 25 22 23  ' GLUCOSE 89 161* 79 69* 75  BUN 81* 36* 41* 49* 57*  CREATININE 11.32* 6.71* 7.61* 8.44* 9.73*  CALCIUM 8.2* 8.0* 7.7* 7.3* 7.4*  AST 21  --   --   --   --   ALT 7  --   --   --   --   ALKPHOS 102  --   --   --   --   BILITOT 0.7  --   --   --   --    ------------------------------------------------------------------------------------------------------------------ No results for input(s): CHOL, HDL, LDLCALC, TRIG, CHOLHDL, LDLDIRECT in the last 72 hours.  Lab Results  Component Value Date   HGBA1C 5.1 04/03/2020   ------------------------------------------------------------------------------------------------------------------ No results for input(s): TSH, T4TOTAL, T3FREE, THYROIDAB in the last 72 hours.  Invalid input(s): FREET3 ------------------------------------------------------------------------------------------------------------------ No results for input(s): VITAMINB12, FOLATE, FERRITIN, TIBC, IRON, RETICCTPCT in the last 72 hours.  Coagulation profile Recent Labs  Lab 04/07/2020 1315  INR 1.3*    No results for input(s): DDIMER in the last 72 hours.  Cardiac Enzymes No results for input(s): CKMB, TROPONINI, MYOGLOBIN in the last 168 hours.  Invalid input(s): CK ------------------------------------------------------------------------------------------------------------------    Component Value Date/Time   BNP 897.8 (H) 01/01/2020 0600    Micro Results Recent Results (from  the past 240 hour(s))  Culture, blood (Routine x 2)     Status: None (Preliminary result)   Collection Time: 04/12/2020  1:00 PM   Specimen: BLOOD  Result Value Ref Range Status   Specimen Description BLOOD RIGHT ANTECUBITAL  Final   Special Requests   Final    BOTTLES DRAWN AEROBIC AND ANAEROBIC Blood Culture results may not be optimal due to an inadequate volume of blood received in culture bottles   Culture   Final    NO GROWTH  3 DAYS Performed at [facility], [place] [address] [place], [place] [address]    Report Status PENDING  Incomplete  Culture, blood (Routine x 2)     Status: None (Preliminary result)   Collection Time: 04/13/2020  1:20 PM   Specimen: BLOOD RIGHT HAND  Result Value Ref Range Status   Specimen Description BLOOD RIGHT HAND  Final   Special Requests   Final    BOTTLES DRAWN AEROBIC ONLY Blood Culture results may not be optimal due to an inadequate volume of blood received in culture bottles   Culture   Final    NO GROWTH 3 DAYS Performed at [facility], [place] [address], [place], [place] [address]    Report Status PENDING  Incomplete  Respiratory Panel by RT PCR (Flu A&B, Covid) - Nasopharyngeal Swab     Status: Abnormal   Collection Time: 04/09/2020  3:58 PM   Specimen: Nasopharyngeal Swab  Result Value Ref Range Status   SARS Coronavirus 2 by RT PCR POSITIVE (A) NEGATIVE Final    Comment: RESULT CALLED TO, READ BACK BY AND VERIFIED WITH: [facility] RN 04/22/2020 1751 JDW (NOTE) SARS-CoV-2 target nucleic acids are DETECTED. SARS-CoV-2 RNA is generally detectable in upper respiratory specimens  during the acute phase of infection. Positive results are indicative of the presence of the identified virus, but do not rule out bacterial infection or co-infection with other pathogens not detected by the test. Clinical correlation with patient history and other diagnostic information is necessary to determine patient infection status. The expected result is  Negative. Fact Sheet for Patients:  PinkCheek.be Fact Sheet for Healthcare Providers: GravelBags.it This test is not yet approved or cleared by the [place] FDA and  has been authorized for detection and/or diagnosis of SARS-CoV-2 by FDA under an Emergency Use Authorization (EUA).  This EUA will remain in effect (meaning this test can be used) for th e duration of  the COVID-19 declaration under Section 564(b)(1) of the Act, 21 U.S.C. section 360bbb-3(b)(1), unless the authorization is terminated or revoked sooner.    Influenza A by PCR NEGATIVE NEGATIVE Final   Influenza B by PCR NEGATIVE NEGATIVE Final    Comment: (NOTE) The Xpert Xpress SARS-CoV-2/FLU/RSV assay is intended as an aid in  the diagnosis of influenza from Nasopharyngeal swab specimens and  should not be used as a sole basis for treatment. Nasal washings and  aspirates are unacceptable for Xpert Xpress SARS-CoV-2/FLU/RSV  testing. Fact Sheet for Patients: PinkCheek.be Fact Sheet for Healthcare Providers: GravelBags.it This test is not yet approved or cleared by the [place] FDA and  has been authorized for detection and/or diagnosis of SARS-CoV-2 by  FDA under an Emergency Use Authorization (EUA). This EUA will remain  in effect (meaning this test can be used) for the duration of the  Covid-19 declaration under Section 564(b)(1) of the Act, 21  U.S.C. section 360bbb-3(b)(1), unless the authorization is  terminated or revoked. Performed at [facility], [place] [address], [place], [place] [address]   Wound or Superficial Culture     Status: None   Collection Time: 04/15/2020  8:43 PM   Specimen: Toe; Wound  Result Value Ref Range Status   Specimen Description TOE  Final   Special Requests RIGHT  Final   Gram Stain   Final    NO WBC SEEN NO ORGANISMS SEEN Performed at [facility], [address] [place],  [address]  Culture RARE PSEUDOMONAS AERUGINOSA  Final   Report Status 04/28/2020 FINAL  Final   Organism ID, Bacteria PSEUDOMONAS AERUGINOSA  Final      Susceptibility   Pseudomonas aeruginosa - MIC*    CEFTAZIDIME 4 SENSITIVE Sensitive     CIPROFLOXACIN <=0.25 SENSITIVE Sensitive     GENTAMICIN 4 SENSITIVE Sensitive     IMIPENEM 2 SENSITIVE Sensitive     PIP/TAZO 8 SENSITIVE Sensitive     CEFEPIME 4 SENSITIVE Sensitive     * RARE PSEUDOMONAS AERUGINOSA    Radiology Reports DG Chest 2 View  Result Date: 04/24/2020 CLINICAL DATA:  68 year old female with suspected sepsis. Bilateral foot infection for the past 3 weeks. EXAM: CHEST - 2 VIEW COMPARISON:  Chest x-ray 04/04/2020. FINDINGS: Lung volumes are normal. No consolidative airspace disease. No pleural effusions. No evidence of frank pulmonary edema. Mild cephalization of the pulmonary vasculature. Mild cardiomegaly. Upper mediastinal contours are within normal limits. Aortic atherosclerosis. Status post median sternotomy for CABG. Right internal jugular PermCath with tip terminating in the mid to distal superior vena cava. Aortic atherosclerosis. IMPRESSION: 1. Postoperative changes and support apparatus, as above. 2. Mild cardiomegaly with pulmonary venous congestion, without frank pulmonary edema. 3. Aortic atherosclerosis. Electronically Signed   By: Vinnie Langton M.D.   On: 04/08/2020 14:52   CT FOOT RIGHT WO CONTRAST  Result Date: 04/12/2020 CLINICAL DATA:  Pain and swelling. EXAM: CT OF THE RIGHT FOOT WITHOUT CONTRAST TECHNIQUE: Multidetector CT imaging of the right foot was performed according to the standard protocol. Multiplanar CT image reconstructions were also generated. COMPARISON:  Radiographs 04/09/2020 FINDINGS: Diffuse subcutaneous soft tissue swelling/edema/fluid involving the entire ankle and foot. No focal fluid collection to suggest a drainable soft tissue abscess. Marked fatty  atrophy of the foot and ankle musculature. Suspect myofasciitis involving the plantar foot musculature but no definite findings for pyomyositis. Suspect an open wound along the plantar aspect of the great toe but I do not see any obvious destructive bony changes to suggest osteomyelitis. No definite findings for septic arthritis. The other bony structures of the foot and ankle appear intact. No obvious destructive bony changes, fractures or osteochondral lesions. Extensive small artery calcifications. IMPRESSION: 1. Diffuse subcutaneous soft tissue swelling/edema/fluid involving the entire ankle and foot consistent with cellulitis. No focal fluid collection to suggest a drainable soft tissue abscess. 2. Suspect myofasciitis involving the plantar foot musculature but no definite findings for pyomyositis. Severe diffuse fatty atrophy of the foot and ankle musculature. 3. Suspect an open wound along the plantar aspect of the great toe but I do not see any obvious destructive bony changes to suggest osteomyelitis. No definite findings for septic arthritis. 4. If symptoms persist or worsen MRI is suggested for further evaluation. Electronically Signed   By: Marijo Sanes M.D.   On: 04/12/2020 10:51   DG CHEST PORT 1 VIEW  Result Date: 04/04/2020 CLINICAL DATA:  69 year old female with fever. EXAM: PORTABLE CHEST 1 VIEW COMPARISON:  Chest radiograph dated 02/24/2020. FINDINGS: Right-sided dialysis catheter in similar position. There is cardiomegaly with mild vascular congestion. No focal consolidation, pleural effusion or pneumothorax. Median sternotomy wires and CABG vascular clips. No acute osseous pathology. IMPRESSION: Cardiomegaly with mild vascular congestion. No focal consolidation. Electronically Signed   By: Anner Crete M.D.   On: 04/04/2020 17:29   DG ABD ACUTE 2+V W 1V CHEST  Result Date: 04/09/2020 CLINICAL DATA:  Fever with low hemoglobin. EXAM: DG ABDOMEN ACUTE W/ 1V CHEST COMPARISON:  Abdominal  film 06/26/2018 and chest x-ray 02/24/2020 FINDINGS: Right IJ central venous catheter unchanged with tip over the SVC. Lungs are adequately inflated with minimal hazy prominence of the perihilar vessels unchanged and likely due to mild chronic vascular congestion. No lobar consolidation or effusion. Mild stable cardiomegaly. Remainder the chest is unchanged. There are several air-filled large and small bowel loops. A few air-filled small bowel loops at the upper limits of normal in diameter over the left abdomen. No evidence of air-fluid levels or free peritoneal air. Remainder of the exam is unchanged. IMPRESSION: Nonspecific, nonobstructive bowel gas pattern with a few air-filled mildly prominent, but nondilated small bowel loops in the left abdomen. Stable mild cardiomegaly with suggestion of minimal chronic vascular congestion. Electronically Signed   By: Marin Olp M.D.   On: 04/09/2020 09:57   DG Foot Complete Left  Result Date: 04/20/2020 CLINICAL DATA:  Recent amputation. Suspect sepsis. Bilateral foot infection 3 weeks. EXAM: LEFT FOOT - COMPLETE 3+ VIEW COMPARISON:  04/09/2020 FINDINGS: There is generalized decreased bone mineralization. Evidence of patient's recent amputation distal to the distal shaft of the first metatarsal. Expected postsurgical changes at the amputation site. No focal bone destruction to suggest osteomyelitis. No air within the soft tissues. Exam is otherwise unchanged. IMPRESSION: Expected changes post amputation distal to the distal aspect of the first metatarsal. No definite bone destruction to suggest osteomyelitis. No air within the soft tissues. Electronically Signed   By: Marin Olp M.D.   On: 03/31/2020 14:58   DG Foot Complete Left  Result Date: 04/09/2020 CLINICAL DATA:  Foot pain and swelling EXAM: LEFT FOOT - COMPLETE 3+ VIEW COMPARISON:  02/01/2020 MRI, 01/31/2020 plain film FINDINGS: There is been interval amputation of the first toe. Persistent soft tissue  wound is seen. There is lucency identified in the head of the first metatarsal suspicious for recurrent osteomyelitis. No other fracture or dislocation is seen. No other soft tissue abnormality is noted. IMPRESSION: Changes suspicious for osteomyelitis in the head of the first metatarsal. Overlying soft tissue wound is noted. Electronically Signed   By: Inez Catalina M.D.   On: 04/09/2020 19:40   DG Foot Complete Right  Result Date: 04/08/2020 CLINICAL DATA:  Recent amputation with possible infection three weeks. Suspect sepsis. EXAM: RIGHT FOOT COMPLETE - 3+ VIEW COMPARISON:  CT 04/12/2020 FINDINGS: Diffuse decreased bone mineralization. Degenerative change over the first MTP joint and interphalangeal joints. Mild horizontal sclerosis with subtle lucency involving the head of the fifth metatarsal possibly subacute fracture. No focal bone destruction to suggest osteomyelitis. No air within the soft tissues. IMPRESSION: 1. No definite evidence of osteomyelitis and no air within the soft tissues. 2. Linear sclerosis with subtle lucency involving the head of the fifth metatarsal which may be due to nondisplaced subacute fracture. Electronically Signed   By: Marin Olp M.D.   On: 03/29/2020 14:56   VAS [place] LOWER EXTREMITY VENOUS (DVT)  Result Date: 04/10/2020  Lower Venous DVTStudy Indications: Edema, and fever.  Limitations: Body habitus and poor ultrasound/tissue interface. Comparison Study: 04/28/19 previous Performing Technologist: Abram Sander RVS  Examination Guidelines: A complete evaluation includes B-mode imaging, spectral Doppler, color Doppler, and power Doppler as needed of all accessible portions of each vessel. Bilateral testing is considered an integral part of a complete examination. Limited examinations for reoccurring indications may be performed as noted. The reflux portion of the exam is performed with the patient in reverse Trendelenburg.   +---------+---------------+---------+-----------+----------+--------------+ RIGHT    CompressibilityPhasicitySpontaneityPropertiesThrombus Aging +---------+---------------+---------+-----------+----------+--------------+ CFV  Full           Yes      Yes                                 +---------+---------------+---------+-----------+----------+--------------+ SFJ      Full                                                        +---------+---------------+---------+-----------+----------+--------------+ FV Prox  Full                                                        +---------+---------------+---------+-----------+----------+--------------+ FV Mid   Full                                                        +---------+---------------+---------+-----------+----------+--------------+ FV DistalFull                                                        +---------+---------------+---------+-----------+----------+--------------+ PFV      Full                                                        +---------+---------------+---------+-----------+----------+--------------+ POP      Full           Yes      Yes                                 +---------+---------------+---------+-----------+----------+--------------+ PTV      Full                                                        +---------+---------------+---------+-----------+----------+--------------+ PERO                                                  Not visualized +---------+---------------+---------+-----------+----------+--------------+   +---------+---------------+---------+-----------+----------+--------------+ LEFT     CompressibilityPhasicitySpontaneityPropertiesThrombus Aging +---------+---------------+---------+-----------+----------+--------------+ CFV      Full           Yes      Yes                                  +---------+---------------+---------+-----------+----------+--------------+ SFJ  Full                                                        +---------+---------------+---------+-----------+----------+--------------+ FV Prox  Full                                                        +---------+---------------+---------+-----------+----------+--------------+ FV Mid   Full                                                        +---------+---------------+---------+-----------+----------+--------------+ FV DistalFull                                                        +---------+---------------+---------+-----------+----------+--------------+ PFV      Full                                                        +---------+---------------+---------+-----------+----------+--------------+ POP      Full           Yes      Yes                                 +---------+---------------+---------+-----------+----------+--------------+ PTV      Full                                                        +---------+---------------+---------+-----------+----------+--------------+ PERO                                                  Not visualized +---------+---------------+---------+-----------+----------+--------------+     Summary: BILATERAL: - No evidence of deep vein thrombosis seen in the lower extremities, bilaterally.   *See table(s) above for measurements and observations. Electronically signed by Deitra Mayo MD on 04/10/2020 at 3:24:49 PM.    Final

## 2020-04-28 NOTE — Progress Notes (Signed)
     [facility] Progress Note  CC:  Anemia and GI bleed  Subjective:  Spoke with patient's nurse and she says that patient had a dark colored stool this AM but no frank blood noted.  Hgb 7.7 grams this AM.  2 attempts were made to contact the patient via phone, but she has been at dialysis all morning.  Objective:  Vital signs in last 24 hours: Temp:  [98.2 F (36.8 C)-98.4 F (36.9 C)] 98.4 F (36.9 C) (05/01 0425) Pulse Rate:  [71-78] 71 (05/01 0435) Resp:  [16-20] 16 (05/01 0435) BP: (126-127)/(59-64) 127/59 (05/01 0425) SpO2:  [74 %-91 %] 91 % (05/01 0435) Weight:  [462.8 kg] 118.4 kg (05/01 0230) Last BM Date: 04/27/20  Not examined due to Covid status.  Intake/Output from previous day: 04/30 0701 - 05/01 0700 In: 480 [P.O.:480] Out: -   Lab Results: Recent Labs    04/27/20 0445 04/27/20 1343 04/28/20 0719  WBC 8.0 7.8 7.6  HGB 8.5* 8.6* 7.7*  HCT 28.4* 28.3* 25.0*  PLT 315 322 313   BMET Recent Labs    04/26/20 1630 04/27/20 0445 04/28/20 0719  NA 142 139 142  K 4.8 4.5 4.5  CL 101 100 101  CO2 25 22 23   GLUCOSE 79 69* 75  BUN 41* 49* 57*  CREATININE 7.61* 8.44* 9.73*  CALCIUM 7.7* 7.3* 7.4*   LFT Recent Labs    04/27/2020 1315 04/26/20 1630 04/28/20 0719  PROT 6.8  --   --   ALBUMIN 2.4*   < > 2.0*  AST 21  --   --   ALT 7  --   --   ALKPHOS 102  --   --   BILITOT 0.7  --   --    < > = values in this interval not displayed.   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 Patient refused to prep for colonoscopy x 2 during last hospitalization earlier this month. Now having bloody bowel movements again.  *Anemia: Multifactorial due to chronic kidney  disease and recurrent GI bleeding. Hemoglobin dow about a gram today from 8.6 grams to 7.7 grams today.  *ESRD on HD  *Acute metabolic encephalopathy:Likely secondary to SIRS/right foot gangrene-uremia due to missed HD.Significantly improved after treatment of SIRS/foot infection with gangrene and dialysis-nowback to baseline.  *Right foot gangrene: For angiogram and possible amputation on Monday, May 3.  *History of paroxysmal atrial fibrillation/atrial flutter as well as history of CVA, but not on anticoagulation due to issues with recurrent GI bleeding.  *Covid positive, patient asymptomatic.  -As of now the patient is agreeable to prep for and proceed with colonoscopy tomorrow, 5/2. -Monitor Hgb and transfuse prn.   LOS: 3 days   [name] [name]. [name] [name]  04/28/2020, 9:32 AM

## 2020-04-28 DEATH — deceased

## 2020-04-29 DIAGNOSIS — U071 COVID-19: Secondary | ICD-10-CM | POA: Diagnosis not present

## 2020-04-29 DIAGNOSIS — K921 Melena: Secondary | ICD-10-CM | POA: Diagnosis not present

## 2020-04-29 DIAGNOSIS — K5520 Angiodysplasia of colon without hemorrhage: Secondary | ICD-10-CM | POA: Diagnosis not present

## 2020-04-29 LAB — RENAL FUNCTION PANEL
Albumin: 2 g/dL — ABNORMAL LOW (ref 3.5–5.0)
Anion gap: 15 (ref 5–15)
BUN: 27 mg/dL — ABNORMAL HIGH (ref 8–23)
CO2: 25 mmol/L (ref 22–32)
Calcium: 7.6 mg/dL — ABNORMAL LOW (ref 8.9–10.3)
Chloride: 100 mmol/L (ref 98–111)
Creatinine, Ser: 5.84 mg/dL — ABNORMAL HIGH (ref 0.44–1.00)
GFR calc Af Amer: 8 mL/min — ABNORMAL LOW (ref >60)
GFR calc non Af Amer: 7 mL/min — ABNORMAL LOW (ref >60)
Glucose, Bld: 97 mg/dL (ref 70–99)
Phosphorus: 4 mg/dL (ref 2.5–4.6)
Potassium: 3.9 mmol/L (ref 3.5–5.1)
Sodium: 140 mmol/L (ref 135–145)

## 2020-04-29 LAB — CBC
HCT: 26.7 % — ABNORMAL LOW (ref 36.0–46.0)
Hemoglobin: 8.1 g/dL — ABNORMAL LOW (ref 12.0–15.0)
MCH: 25.6 pg — ABNORMAL LOW (ref 26.0–34.0)
MCHC: 30.3 g/dL (ref 30.0–36.0)
MCV: 84.5 fL (ref 80.0–100.0)
Platelets: 311 10*3/uL (ref 150–400)
RBC: 3.16 MIL/uL — ABNORMAL LOW (ref 3.87–5.11)
RDW: 18.6 % — ABNORMAL HIGH (ref 11.5–15.5)
WBC: 7.2 10*3/uL (ref 4.0–10.5)
nRBC: 0.4 % — ABNORMAL HIGH (ref 0.0–0.2)

## 2020-04-29 LAB — GLUCOSE, CAPILLARY
Glucose-Capillary: 106 mg/dL — ABNORMAL HIGH (ref 70–99)
Glucose-Capillary: 120 mg/dL — ABNORMAL HIGH (ref 70–99)
Glucose-Capillary: 155 mg/dL — ABNORMAL HIGH (ref 70–99)
Glucose-Capillary: 92 mg/dL (ref 70–99)

## 2020-04-29 MED ORDER — PEG-KCL-NACL-NASULF-NA ASC-C 100 G PO SOLR
0.5000 | Freq: Once | ORAL | Status: DC
  Filled 2020-04-29: qty 1

## 2020-04-29 MED ORDER — BISACODYL 5 MG PO TBEC
20.0000 mg | DELAYED_RELEASE_TABLET | Freq: Once | ORAL | Status: AC
  Administered 2020-04-29: 20 mg via ORAL
  Filled 2020-04-29: qty 4

## 2020-04-29 MED ORDER — PEG-KCL-NACL-NASULF-NA ASC-C 100 G PO SOLR
0.5000 | Freq: Once | ORAL | Status: AC
  Administered 2020-04-29: 100 g via ORAL
  Filled 2020-04-29: qty 1

## 2020-04-29 MED ORDER — PEG-KCL-NACL-NASULF-NA ASC-C 100 G PO SOLR: 1.0000 | Freq: Once | ORAL | Status: DC

## 2020-04-29 NOTE — Progress Notes (Signed)
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

## 2020-04-29 NOTE — Progress Notes (Signed)
PROGRESS NOTE                                                                                                                                                                                                             Patient Demographics:    [name] [name], is a 69 y.o. female, DOB - 1951-03-01, FRT:[id number]  Outpatient Primary MD for the patient is [place] date - 04/03/2020   LOS - 4  Chief Complaint  Patient presents with  . Altered Mental Status       Brief Narrative: Patient is a 69 y.o. female with PMHx of ESRD, PAD-s/p left great toe amputation on 04/11/2020, CAD s/p CABG, DM-2, HTN-who was transferred from her SNF for evaluation of acute metabolic encephalopathy in the setting of gangrenous changes to the right foot.  She was also incidentally found to have COVID-19.  See below for further details  Significant Events: 4/6-4/22>> admit to [facility] for upper GI bleeding and blood loss anemia, left great toe osteo s/p amputation 4/28>> admit to [facility] for acute metabolic encephalopathy 7/01>> bloody stools   COVID-19 medications: Remdesivir: 4/28>> 4/29  Antibiotics: Vancomycin: 4/28>>5/1 Zosyn: 4/28>>  Microbiology data: 4/28: Blood culture>> no growth 4/28: Wound swab/superficial culture>> Pseudomonas  DVT prophylaxis: Hold Heparin 4/30 2/2 GI bleed  Procedures: 4/14>> left great toe amputation 4/10>> EGD  Consults: VVS, renal,GI    Subjective:   Did not complete her colonoscopy prep today-wants to eat. After I told her that we could get her steak after she completes her prep-she agreed to continue with the prep. Colonoscopy now scheduled for tomorrow. Per nursing staff-had 1 bloody-looking BM overnight.   Assessment  & Plan :   Acute metabolic encephalopathy: Likely secondary to SIRS/right foot gangrene-uremia due to missed HD.  Significantly improved after  treatment of SIRS/foot infection with gangrene and dialysis-now back to baseline.  SIRS (present on admission) with right lower extremity critical limb ischemia: Afebrile-SIRS physiology has improved-has gangrenous changes evident on exam-please see pictures taken on 4/28 and prior notes.  Blood cultures negative so far.    Superficial swab on admission positive for Pseudomonas-not sure how accurate this is. No longer on vancomycin-continue Zosyn. Refused angiogram/amputation on 4/29-now scheduled for 5/3. She has been counseled extensively-she is aware that if she continues to refuse  angiogram/amputation-we may end up doing a more proximal amputation in the near future.   History of PAD with recent left great toe amputation-now with nonhealing left foot wound: Vascular surgery planning debridement when she consents for procedure.  Remains on statin-aspirin held due to possible GI bleeding.  Hyperkalemia: Secondary to missed HD-resolved with HD.  Follow periodically.  Recurrent upper GI bleeding-history of AVMs and acute blood loss anemia: Continues to have some staggering/intermittent GI bleeding-1 additional bloody appearing BM overnight with colonoscopy prep. However hemoglobin stable. Unfortunately she did not complete colonoscopy prep today-counseled-colonoscopy now scheduled for 5/3. Continue to follow CBC.  ESRD: Nephrology following and directing care.  Anemia: Multifactorial-likely secondary to kidney disease-iron/iron per nephrology.  HTN: BP relatively well controlled-continue metoprolol  DM-2 with hypoglycemia (A1C 5.1 on 04/03/2020): No further hypoglycemic episodes-continue to hold all insulin for now. Recent Labs    04/28/20 2100 04/29/20 0758 04/29/20 1154  GLUCAP 117* 92 106*   Paroxysmal atrial fibrillation/atrial flutter: Not a new diagnosis-Per discharge summary in October 2020-she also had atrial fibrillation  as well.  Given recurrent GI bleeding/anemia-not a good  candidate for anticoagulation.  Continue rate control with metoprolol.  Chronic combined diastolic and systolic heart failure (EF 45-50% by TTE on 04/28/2019): Volume status stable-diuresis with HD.  History of CVA: Continue statin-see above regarding aspirin and anticoagulation  Gout: Not in flare-continue allopurinol  Covid 19 Viral infection: Appears to be asymptomatic--on room air this morning.  Chest x-ray on admission did not show pneumonia.  Fever: afebrile  O2 requirements:  SpO2: 100 % O2 Flow Rate (L/min): 2 L/min   COVID-19 Labs: Recent Labs    04/27/20 0445  CRP 17.9*       Component Value Date/Time   BNP 897.8 (H) 01/01/2020 0600    No results for input(s): PROCALCITON in the last 168 hours.  Lab Results  Component Value Date   SARSCOV2NAA POSITIVE (A) 04/20/2020   [place] NEGATIVE 04/16/2020   [place] NEGATIVE 04/03/2020   [place] NEGATIVE 03/15/2020     Morbid Obesity: Estimated body mass index is 39.2 kg/m as calculated from the following:   Height as of 04/11/20: 5' 7.99" (1.727 m).   Weight as of this encounter: 116.9 kg.     ABG:    Component Value Date/Time   PHART 7.419 02/05/2020 1114   PCO2ART 43.0 02/05/2020 1114   PO2ART 85.1 02/05/2020 1114   HCO3 27.2 02/05/2020 1114   TCO2 22 01/01/2020 0614   ACIDBASEDEF 6.0 (H) 01/01/2020 0614   O2SAT 95.3 02/05/2020 1114    Vent Settings: N/A    Condition - Guarded  Family Communication  : Called aunt on 5/2-voicemail not set up. Will reattempt on 5/3.  Code Status :  Full Code  Diet :  Diet Order            Diet NPO time specified  Diet effective midnight        Diet clear liquid Room service appropriate? Yes; Fluid consistency: Thin  Diet effective now               Disposition Plan  :  Status is: Inpatient  Remains inpatient appropriate because:Inpatient level of care appropriate due to severity of illness  Dispo: The patient is from: SNF               Anticipated d/c is to: SNF              Anticipated d/c date is: > 3 days  Patient currently is not medically stable to d/c.   Barriers to discharge: Right foot gangrene-requiring amputation-remains on IV antibiotics  Antimicorbials  :    Anti-infectives (From admission, onward)   Start     Dose/Rate Route Frequency Ordered Stop   04/28/20 1200  vancomycin (VANCOCIN) IVPB 1000 mg/200 mL premix  Status:  Discontinued     1,000 mg 200 mL/hr over 60 Minutes Intravenous Every T-Th-Sa (Hemodialysis) 04/27/20 1154 04/28/20 1218   04/26/20 1400  piperacillin-tazobactam (ZOSYN) IVPB 2.25 g     2.25 g 100 mL/hr over 30 Minutes Intravenous Every 8 hours 04/26/20 1314     04/26/20 1000  remdesivir 100 mg in sodium chloride 0.9 % 100 mL IVPB  Status:  Discontinued     100 mg 200 mL/hr over 30 Minutes Intravenous Daily 04/07/2020 1952 04/26/20 1105   04/20/2020 2030  remdesivir 200 mg in sodium chloride 0.9% 250 mL IVPB     200 mg 580 mL/hr over 30 Minutes Intravenous Once 04/01/2020 1952 04/05/2020 2327   04/11/2020 1515  vancomycin (VANCOCIN) 2,500 mg in sodium chloride 0.9 % 500 mL IVPB     2,500 mg 250 mL/hr over 120 Minutes Intravenous  Once 04/13/2020 1513 04/17/2020 2026   04/05/2020 1513  vancomycin variable dose per unstable renal function (pharmacist dosing)  Status:  Discontinued      Does not apply See admin instructions 04/20/2020 1514 04/28/20 1218   04/10/2020 1500  piperacillin-tazobactam (ZOSYN) IVPB 3.375 g     3.375 g 100 mL/hr over 30 Minutes Intravenous  Once 04/06/2020 1450 04/07/2020 2026      Inpatient Medications  Scheduled Meds: . allopurinol  100 mg Oral Daily  . atorvastatin  40 mg Oral q1800  . bisacodyl  20 mg Oral Once  . buPROPion  150 mg Oral BID  . calcitRIOL  2.25 mcg Oral Q T,Th,Sat-1800  . Chlorhexidine Gluconate Cloth  6 each Topical Q0600  . cinacalcet  30 mg Oral Q T,Th,Sat-1800  . darbepoetin (ARANESP) injection - DIALYSIS  150 mcg Intravenous Q Sat-HD  .  gabapentin  300 mg Oral QHS  . linaclotide  72 mcg Oral QAC breakfast  . loratadine  10 mg Oral Daily  . metoprolol tartrate  25 mg Oral BID  . pantoprazole (PROTONIX) IV  40 mg Intravenous Q12H  . peg 3350 powder  0.5 kit Oral Once   And  . peg 3350 powder  0.5 kit Oral Once  . sodium chloride flush  3 mL Intravenous Q12H  . sucroferric oxyhydroxide  1,000 mg Oral TID WC   Continuous Infusions: . sodium chloride    . sodium chloride    . sodium chloride    . piperacillin-tazobactam (ZOSYN)  IV 2.25 g (04/29/20 1026)   PRN Meds:.sodium chloride, sodium chloride, sodium chloride, acetaminophen, albuterol, alteplase, heparin, lidocaine (PF), lidocaine-prilocaine, metoprolol tartrate, mometasone-formoterol, nitroGLYCERIN, oxyCODONE, pentafluoroprop-tetrafluoroeth, polyethylene glycol, sodium chloride flush   Time Spent in minutes 25   See all Orders from today for further details   Oren Binet M.D on 04/29/2020 at 2:19 PM  To page go to [email or web address] - use universal password  [facility] -  Office  [phone]    Objective:   Vitals:   04/29/20 0404 04/29/20 0900 04/29/20 0909 04/29/20 1025  BP: 114/63 (!) 112/100 (!) 112/100 (!) 109/50  Pulse:   76 71  Resp: 18 (!) 28  16  Temp: 98.9 F (37.2 C)   98 F (36.7 C)  TempSrc: Oral  Oral  SpO2: 99%   100%  Weight:        Wt Readings from Last 3 Encounters:  04/28/20 116.9 kg  04/19/20 118.4 kg  03/02/20 120.2 kg     Intake/Output Summary (Last 24 hours) at 04/29/2020 1419 Last data filed at 04/29/2020 0000 Gross per 24 hour  Intake 504.95 ml  Output 1 ml  Net 503.95 ml     Physical Exam Gen Exam:Alert awake-not in any distress HEENT:atraumatic, normocephalic Chest: B/L clear to auscultation anteriorly CVS:S1S2 regular Abdomen:soft non tender, non distended Extremities:trace edema Neurology: Non focal Skin: no rash   Data Review:    CBC Recent Labs  Lab 04/06/2020 1315 04/24/2020 1315  04/26/20 1630 04/27/20 0445 04/27/20 1343 04/28/20 0719 04/29/20 0632  WBC 7.9   < > 7.7 8.0 7.8 7.6 7.2  HGB 8.8*   < > 8.2* 8.5* 8.6* 7.7* 8.1*  HCT 29.0*   < > 26.4* 28.4* 28.3* 25.0* 26.7*  PLT 416*   < > 325 315 322 313 311  MCV 86.1   < > 86.0 86.9 86.5 84.5 84.5  MCH 26.1   < > 26.7 26.0 26.3 26.0 25.6*  MCHC 30.3   < > 31.1 29.9* 30.4 30.8 30.3  RDW 18.9*   < > 18.9* 19.0* 18.9* 19.0* 18.6*  LYMPHSABS 1.3  --   --   --   --  1.3  --   MONOABS 0.7  --   --   --   --  0.6  --   EOSABS 0.0  --   --   --   --  0.1  --   BASOSABS 0.1  --   --   --   --  0.0  --    < > = values in this interval not displayed.    Chemistries  Recent Labs  Lab 04/02/2020 1315 04/11/2020 1315 04/26/20 0500 04/26/20 1630 04/27/20 0445 04/28/20 0719 04/29/20 0632  NA 138   < > 139 142 139 142 140  K 6.6*   < > 4.4 4.8 4.5 4.5 3.9  CL 97*   < > 100 101 100 101 100  CO2 22   < > '25 25 22 23 25  ' GLUCOSE 89   < > 161* 79 69* 75 97  BUN 81*   < > 36* 41* 49* 57* 27*  CREATININE 11.32*   < > 6.71* 7.61* 8.44* 9.73* 5.84*  CALCIUM 8.2*   < > 8.0* 7.7* 7.3* 7.4* 7.6*  AST 21  --   --   --   --   --   --   ALT 7  --   --   --   --   --   --   ALKPHOS 102  --   --   --   --   --   --   BILITOT 0.7  --   --   --   --   --   --    < > = values in this interval not displayed.   ------------------------------------------------------------------------------------------------------------------ No results for input(s): CHOL, HDL, LDLCALC, TRIG, CHOLHDL, LDLDIRECT in the last 72 hours.  Lab Results  Component Value Date   HGBA1C 5.1 04/03/2020   ------------------------------------------------------------------------------------------------------------------ No results for input(s): TSH, T4TOTAL, T3FREE, THYROIDAB in the last 72 hours.  Invalid input(s): FREET3 ------------------------------------------------------------------------------------------------------------------ No results for input(s):  VITAMINB12, FOLATE, FERRITIN, TIBC, IRON, RETICCTPCT in the last 72 hours.  Coagulation profile Recent Labs  Lab 04/17/2020 1315  INR 1.3*    No results for input(s): DDIMER in the last 72 hours.  Cardiac Enzymes No results for input(s): CKMB, TROPONINI, MYOGLOBIN in the last 168 hours.  Invalid input(s): CK ------------------------------------------------------------------------------------------------------------------    Component Value Date/Time   BNP 897.8 (H) 01/01/2020 0600    Micro Results Recent Results (from the past 240 hour(s))  Culture, blood (Routine x 2)     Status: None (Preliminary result)   Collection Time: 04/05/2020  1:00 PM   Specimen: BLOOD  Result Value Ref Range Status   Specimen Description BLOOD RIGHT ANTECUBITAL  Final   Special Requests   Final    BOTTLES DRAWN AEROBIC AND ANAEROBIC Blood Culture results may not be optimal due to an inadequate volume of blood received in culture bottles   Culture   Final    NO GROWTH 4 DAYS Performed at [facility], [place] [address], [place], [place] [address]    Report Status PENDING  Incomplete  Culture, blood (Routine x 2)     Status: None (Preliminary result)   Collection Time: 04/05/2020  1:20 PM   Specimen: BLOOD RIGHT HAND  Result Value Ref Range Status   Specimen Description BLOOD RIGHT HAND  Final   Special Requests   Final    BOTTLES DRAWN AEROBIC ONLY Blood Culture results may not be optimal due to an inadequate volume of blood received in culture bottles   Culture   Final    NO GROWTH 4 DAYS Performed at [facility], [place] [address] [place], [place] [address]    Report Status PENDING  Incomplete  Respiratory Panel by RT PCR (Flu A&B, Covid) - Nasopharyngeal Swab     Status: Abnormal   Collection Time: 04/18/2020  3:58 PM   Specimen: Nasopharyngeal Swab  Result Value Ref Range Status   SARS Coronavirus 2 by RT PCR POSITIVE (A) NEGATIVE Final    Comment: RESULT CALLED TO, READ BACK BY AND  VERIFIED WITH: K [facility] RN 04/20/2020 1751 JDW (NOTE) SARS-CoV-2 target nucleic acids are DETECTED. SARS-CoV-2 RNA is generally detectable in upper respiratory specimens  during the acute phase of infection. Positive results are indicative of the presence of the identified virus, but do not rule out bacterial infection or co-infection with other pathogens not detected by the test. Clinical correlation with patient history and other diagnostic information is necessary to determine patient infection status. The expected result is Negative. Fact Sheet for Patients:  PinkCheek.be Fact Sheet for Healthcare Providers: GravelBags.it This test is not yet approved or cleared by the [place] FDA and  has been authorized for detection and/or diagnosis of SARS-CoV-2 by FDA under an Emergency Use Authorization (EUA).  This EUA will remain in effect (meaning this test can be used) for th e duration of  the COVID-19 declaration under Section 564(b)(1) of the Act, 21 U.S.C. section 360bbb-3(b)(1), unless the authorization is terminated or revoked sooner.    Influenza A by PCR NEGATIVE NEGATIVE Final   Influenza B by PCR NEGATIVE NEGATIVE Final    Comment: (NOTE) The Xpert Xpress SARS-CoV-2/FLU/RSV assay is intended as an aid in  the diagnosis of influenza from Nasopharyngeal swab specimens and  should not be used as a sole basis for treatment. Nasal washings and  aspirates are unacceptable for Xpert Xpress SARS-CoV-2/FLU/RSV  testing. Fact Sheet for Patients: PinkCheek.be Fact Sheet for Healthcare Providers: GravelBags.it This test is not yet approved or cleared by the [place] and  has been authorized for detection and/or diagnosis of SARS-CoV-2 by  FDA under an Emergency Use Authorization (EUA). This EUA will remain  in effect (meaning this test can be used) for the  duration of the  Covid-19 declaration under Section 564(b)(1) of the Act, 21  U.S.C. section 360bbb-3(b)(1), unless the authorization is  terminated or revoked. Performed at [facility], [place] [address], [place], [place] [address]   Wound or Superficial Culture     Status: None   Collection Time: 04/11/2020  8:43 PM   Specimen: Toe; Wound  Result Value Ref Range Status   Specimen Description TOE  Final   Special Requests RIGHT  Final   Gram Stain   Final    NO WBC SEEN NO ORGANISMS SEEN Performed at [facility], [address] [place], [place] [address]    Culture RARE PSEUDOMONAS AERUGINOSA  Final   Report Status 04/28/2020 FINAL  Final   Organism ID, Bacteria PSEUDOMONAS AERUGINOSA  Final      Susceptibility   Pseudomonas aeruginosa - MIC*    CEFTAZIDIME 4 SENSITIVE Sensitive     CIPROFLOXACIN <=0.25 SENSITIVE Sensitive     GENTAMICIN 4 SENSITIVE Sensitive     IMIPENEM 2 SENSITIVE Sensitive     PIP/TAZO 8 SENSITIVE Sensitive     CEFEPIME 4 SENSITIVE Sensitive     * RARE PSEUDOMONAS AERUGINOSA    Radiology Reports DG Chest 2 View  Result Date: 04/24/2020 CLINICAL DATA:  69 year old female with suspected sepsis. Bilateral foot infection for the past 3 weeks. EXAM: CHEST - 2 VIEW COMPARISON:  Chest x-ray 04/04/2020. FINDINGS: Lung volumes are normal. No consolidative airspace disease. No pleural effusions. No evidence of frank pulmonary edema. Mild cephalization of the pulmonary vasculature. Mild cardiomegaly. Upper mediastinal contours are within normal limits. Aortic atherosclerosis. Status post median sternotomy for CABG. Right internal jugular PermCath with tip terminating in the mid to distal superior vena cava. Aortic atherosclerosis. IMPRESSION: 1. Postoperative changes and support apparatus, as above. 2. Mild cardiomegaly with pulmonary venous congestion, without frank pulmonary edema. 3. Aortic atherosclerosis. Electronically Signed   By: Vinnie Langton M.D.    On: 04/06/2020 14:52   CT FOOT RIGHT WO CONTRAST  Result Date: 04/12/2020 CLINICAL DATA:  Pain and swelling. EXAM: CT OF THE RIGHT FOOT WITHOUT CONTRAST TECHNIQUE: Multidetector CT imaging of the right foot was performed according to the standard protocol. Multiplanar CT image reconstructions were also generated. COMPARISON:  Radiographs 04/09/2020 FINDINGS: Diffuse subcutaneous soft tissue swelling/edema/fluid involving the entire ankle and foot. No focal fluid collection to suggest a drainable soft tissue abscess. Marked fatty atrophy of the foot and ankle musculature. Suspect myofasciitis involving the plantar foot musculature but no definite findings for pyomyositis. Suspect an open wound along the plantar aspect of the great toe but I do not see any obvious destructive bony changes to suggest osteomyelitis. No definite findings for septic arthritis. The other bony structures of the foot and ankle appear intact. No obvious destructive bony changes, fractures or osteochondral lesions. Extensive small artery calcifications. IMPRESSION: 1. Diffuse subcutaneous soft tissue swelling/edema/fluid involving the entire ankle and foot consistent with cellulitis. No focal fluid collection to suggest a drainable soft tissue abscess. 2. Suspect myofasciitis involving the plantar foot musculature but no definite findings for pyomyositis. Severe diffuse fatty atrophy of the foot and ankle musculature. 3. Suspect an open wound along the plantar aspect of the great toe but I do not see any obvious destructive bony changes to  suggest osteomyelitis. No definite findings for septic arthritis. 4. If symptoms persist or worsen MRI is suggested for further evaluation. Electronically Signed   By: Marijo Sanes M.D.   On: 04/12/2020 10:51   DG CHEST PORT 1 VIEW  Result Date: 04/04/2020 CLINICAL DATA:  69 year old female with fever. EXAM: PORTABLE CHEST 1 VIEW COMPARISON:  Chest radiograph dated 02/24/2020. FINDINGS: Right-sided  dialysis catheter in similar position. There is cardiomegaly with mild vascular congestion. No focal consolidation, pleural effusion or pneumothorax. Median sternotomy wires and CABG vascular clips. No acute osseous pathology. IMPRESSION: Cardiomegaly with mild vascular congestion. No focal consolidation. Electronically Signed   By: Anner Crete M.D.   On: 04/04/2020 17:29   DG ABD ACUTE 2+V W 1V CHEST  Result Date: 04/09/2020 CLINICAL DATA:  Fever with low hemoglobin. EXAM: DG ABDOMEN ACUTE W/ 1V CHEST COMPARISON:  Abdominal film 06/26/2018 and chest x-ray 02/24/2020 FINDINGS: Right IJ central venous catheter unchanged with tip over the SVC. Lungs are adequately inflated with minimal hazy prominence of the perihilar vessels unchanged and likely due to mild chronic vascular congestion. No lobar consolidation or effusion. Mild stable cardiomegaly. Remainder the chest is unchanged. There are several air-filled large and small bowel loops. A few air-filled small bowel loops at the upper limits of normal in diameter over the left abdomen. No evidence of air-fluid levels or free peritoneal air. Remainder of the exam is unchanged. IMPRESSION: Nonspecific, nonobstructive bowel gas pattern with a few air-filled mildly prominent, but nondilated small bowel loops in the left abdomen. Stable mild cardiomegaly with suggestion of minimal chronic vascular congestion. Electronically Signed   By: Marin Olp M.D.   On: 04/09/2020 09:57   DG Foot Complete Left  Result Date: 04/15/2020 CLINICAL DATA:  Recent amputation. Suspect sepsis. Bilateral foot infection 3 weeks. EXAM: LEFT FOOT - COMPLETE 3+ VIEW COMPARISON:  04/09/2020 FINDINGS: There is generalized decreased bone mineralization. Evidence of patient's recent amputation distal to the distal shaft of the first metatarsal. Expected postsurgical changes at the amputation site. No focal bone destruction to suggest osteomyelitis. No air within the soft tissues. Exam  is otherwise unchanged. IMPRESSION: Expected changes post amputation distal to the distal aspect of the first metatarsal. No definite bone destruction to suggest osteomyelitis. No air within the soft tissues. Electronically Signed   By: Marin Olp M.D.   On: 03/31/2020 14:58   DG Foot Complete Left  Result Date: 04/09/2020 CLINICAL DATA:  Foot pain and swelling EXAM: LEFT FOOT - COMPLETE 3+ VIEW COMPARISON:  02/01/2020 MRI, 01/31/2020 plain film FINDINGS: There is been interval amputation of the first toe. Persistent soft tissue wound is seen. There is lucency identified in the head of the first metatarsal suspicious for recurrent osteomyelitis. No other fracture or dislocation is seen. No other soft tissue abnormality is noted. IMPRESSION: Changes suspicious for osteomyelitis in the head of the first metatarsal. Overlying soft tissue wound is noted. Electronically Signed   By: Inez Catalina M.D.   On: 04/09/2020 19:40   DG Foot Complete Right  Result Date: 04/01/2020 CLINICAL DATA:  Recent amputation with possible infection three weeks. Suspect sepsis. EXAM: RIGHT FOOT COMPLETE - 3+ VIEW COMPARISON:  CT 04/12/2020 FINDINGS: Diffuse decreased bone mineralization. Degenerative change over the first MTP joint and interphalangeal joints. Mild horizontal sclerosis with subtle lucency involving the head of the fifth metatarsal possibly subacute fracture. No focal bone destruction to suggest osteomyelitis. No air within the soft tissues. IMPRESSION: 1. No definite evidence of osteomyelitis and  no air within the soft tissues. 2. Linear sclerosis with subtle lucency involving the head of the fifth metatarsal which may be due to nondisplaced subacute fracture. Electronically Signed   By: Marin Olp M.D.   On: 04/04/2020 14:56   VAS [place] LOWER EXTREMITY VENOUS (DVT)  Result Date: 04/10/2020  Lower Venous DVTStudy Indications: Edema, and fever.  Limitations: Body habitus and poor ultrasound/tissue interface.  Comparison Study: 04/28/19 previous Performing Technologist: Abram Sander RVS  Examination Guidelines: A complete evaluation includes B-mode imaging, spectral Doppler, color Doppler, and power Doppler as needed of all accessible portions of each vessel. Bilateral testing is considered an integral part of a complete examination. Limited examinations for reoccurring indications may be performed as noted. The reflux portion of the exam is performed with the patient in reverse Trendelenburg.  +---------+---------------+---------+-----------+----------+--------------+ RIGHT    CompressibilityPhasicitySpontaneityPropertiesThrombus Aging +---------+---------------+---------+-----------+----------+--------------+ CFV      Full           Yes      Yes                                 +---------+---------------+---------+-----------+----------+--------------+ SFJ      Full                                                        +---------+---------------+---------+-----------+----------+--------------+ FV Prox  Full                                                        +---------+---------------+---------+-----------+----------+--------------+ FV Mid   Full                                                        +---------+---------------+---------+-----------+----------+--------------+ FV DistalFull                                                        +---------+---------------+---------+-----------+----------+--------------+ PFV      Full                                                        +---------+---------------+---------+-----------+----------+--------------+ POP      Full           Yes      Yes                                 +---------+---------------+---------+-----------+----------+--------------+ PTV      Full                                                        +---------+---------------+---------+-----------+----------+--------------+  PERO                                                   Not visualized +---------+---------------+---------+-----------+----------+--------------+   +---------+---------------+---------+-----------+----------+--------------+ LEFT     CompressibilityPhasicitySpontaneityPropertiesThrombus Aging +---------+---------------+---------+-----------+----------+--------------+ CFV      Full           Yes      Yes                                 +---------+---------------+---------+-----------+----------+--------------+ SFJ      Full                                                        +---------+---------------+---------+-----------+----------+--------------+ FV Prox  Full                                                        +---------+---------------+---------+-----------+----------+--------------+ FV Mid   Full                                                        +---------+---------------+---------+-----------+----------+--------------+ FV DistalFull                                                        +---------+---------------+---------+-----------+----------+--------------+ PFV      Full                                                        +---------+---------------+---------+-----------+----------+--------------+ POP      Full           Yes      Yes                                 +---------+---------------+---------+-----------+----------+--------------+ PTV      Full                                                        +---------+---------------+---------+-----------+----------+--------------+ PERO                                                  Not visualized +---------+---------------+---------+-----------+----------+--------------+  Summary: BILATERAL: - No evidence of deep vein thrombosis seen in the lower extremities, bilaterally.   *See table(s) above for measurements and observations. Electronically signed by Deitra Mayo MD  on 04/10/2020 at 3:24:49 PM.    Final

## 2020-04-29 NOTE — Progress Notes (Signed)
Patient ID: [name] [name], female   DOB: 1951/05/11, 69 y.o.   MRN: [id number]  [facility] Progress Note   Assessment/ Plan:   1.  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 2.  Rectal bleeding: Prior history of arteriovenous malformations and seen by gastroenterology with colonoscopy rescheduled until tomorrow due to inadequate prep. 3. ESRD: Continue routine hemodialysis on a TTS schedule with next hemodialysis due for Tuesday 5/4.  She underwent hemodialysis with mild intradialytic hypotension yesterday responding well to adjustment of ultrafiltration goal. 4. Anemia: Acute blood loss anemia superimposed on anemia of ESRD/chronic kidney disease.  Hemodynamically stable and received ESA with dialysis yesterday. 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 6. Nutrition: Continue renal diet with fluid restriction and nutritional supplementation to improve wound healing. 7. Hypertension: Blood pressure within acceptable range.  With evidence of chronic volume overload but will continue to follow with dialysis.  Subjective:   Denies any chest pain or shortness of breath.   Objective:   BP (!) 112/100   Pulse 76   Temp 98.9 F (37.2 C) (Oral)   Resp (!) 28   Wt 116.9 kg   SpO2 99%   BMI 39.20 kg/m   Physical Exam: Gen: Appears chronically ill, resting comfortably in bed. CVS: Pulse regular rhythm, normal rate, 3/6 ejection systolic murmur. Resp: Diminished breath sounds over bases, no distinct rales or rhonchi Abd: Soft, obese, mild tenderness over epigastric area.  Bowel sounds normal Ext: Right lower extremity toes with ischemic discoloration and ulceration.  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  Labs: BMET Recent Labs  Lab 04/08/2020 1315 04/26/20 0500 04/26/20 1630 04/27/20 0445 04/28/20 0719 04/29/20 0632  NA 138 139 142 139 142 140  K 6.6* 4.4 4.8 4.5 4.5 3.9  CL 97* 100 101 100 101 100  CO2 22 25 25 22 23 25   GLUCOSE 89 161* 79 69* 75 97  BUN 81* 36* 41* 49* 57* 27*  CREATININE 11.32* 6.71* 7.61* 8.44* 9.73* 5.84*  CALCIUM 8.2* 8.0* 7.7* 7.3* 7.4* 7.6*  PHOS  --   --  5.6* 5.8* 6.9* 4.0   CBC Recent Labs  Lab 04/18/2020 1315 04/26/20 1630 04/27/20 0445 04/27/20 1343 04/28/20 0719 04/29/20 0632  WBC 7.9   < > 8.0 7.8 7.6 7.2  NEUTROABS 5.8  --   --   --  5.6  --   HGB 8.8*   < > 8.5* 8.6* 7.7* 8.1*  HCT 29.0*   < > 28.4* 28.3* 25.0* 26.7*  MCV 86.1   < > 86.9 86.5 84.5 84.5  PLT 416*   < > 315 322 313 311   < > = values in this interval not displayed.     Medications:    . allopurinol  100 mg Oral Daily  . atorvastatin  40 mg Oral q1800  . buPROPion  150 mg Oral BID  . calcitRIOL  2.25 mcg Oral Q T,Th,Sat-1800  . Chlorhexidine Gluconate Cloth  6 each Topical Q0600  . cinacalcet  30 mg Oral Q T,Th,Sat-1800  . darbepoetin (ARANESP) injection - DIALYSIS  150 mcg Intravenous Q Sat-HD  . gabapentin  300 mg Oral QHS  . linaclotide  72 mcg Oral QAC breakfast  . loratadine  10  mg Oral Daily  . metoprolol tartrate  25 mg Oral BID  . pantoprazole (PROTONIX) IV  40 mg Intravenous Q12H  . sodium chloride flush  3 mL Intravenous Q12H  . sucroferric oxyhydroxide  1,000 mg Oral TID WC   Elmarie Shiley, MD 04/29/2020, 10:17 AM

## 2020-04-29 NOTE — Progress Notes (Signed)
Colonoscopy cancelled 04/29/2020 per Dr. Bryan Lemma for patient unable to finish bowel prep. She has had only 2 bowel movements white are soft solid. Rescheduled for 04/29/2020. Primary RN notified of this.

## 2020-04-29 NOTE — Progress Notes (Signed)
69 year old female admitted with acute metabolic encephalopathy with gangrenous changes to the right foot also found to be COVID-19 positive.  In addition concern for GI bleed in the setting of previous AVMs.  She is scheduled for colonoscopy today with GI.  Hemoglobin relatively stable from 7.7 to 8.1.  Please keep her n.p.o. after midnight with plans for right lower extremity arteriogram with Dr. Donzetta Matters tomorrow on 5/3 and at a later time will require amputation of the right toes and debridement of the left foot.  This will be the last case tomorrow due to Covid positive status.  Marty Heck, MD [facility] Office: [place]

## 2020-04-29 NOTE — Progress Notes (Signed)
Patient educated on drinking the bowel prep for her colonoscopy tomorrow. pt understood and will attempt to drink the prep again.

## 2020-04-29 NOTE — Progress Notes (Signed)
Telemetry called to inform nurse that the patient had a six-beat run of [name] [name].  The patient denies any chest pain or discomfort.  NP on call made aware.  No new orders given.  Will continue to monitor patient.

## 2020-04-29 NOTE — Progress Notes (Signed)
     [facility] Progress Note  CC:  Anemia and GI bleed  Subjective:  Patient refused colonoscopy prep again.  Spoke with Dr. Sloan Leiter who is asking [place] to have her attempt one more time for tomorrow.  He will speak with vascular surgery about postponing her procedure with them until Tuesday.  She will need anti-platelet therapy so really need to look for an treat any AVMs that may continue to bleed.  I spoke with the patient by phone and after extensive explanation she agreed to prep for colonoscopy.  Objective:  Vital signs in last 24 hours: Temp:  [98 F (36.7 C)-99 F (37.2 C)] 98 F (36.7 C) (05/02 1025) Pulse Rate:  [71-90] 71 (05/02 1025) Resp:  [14-28] 16 (05/02 1025) BP: (70-163)/(43-108) 109/50 (05/02 1025) SpO2:  [98 %-100 %] 100 % (05/02 1025) Weight:  [116.9 kg] 116.9 kg (05/01 1250) Last BM Date: 04/28/20  Patient not examined due to Covid status.  Intake/Output from previous day: 05/01 0701 - 05/02 0700 In: 505 [IV Piggyback:505] Out: 2001 [Stool:1]  Lab Results: Recent Labs    04/27/20 1343 04/28/20 0719 04/29/20 0632  WBC 7.8 7.6 7.2  HGB 8.6* 7.7* 8.1*  HCT 28.3* 25.0* 26.7*  PLT 322 313 311   BMET Recent Labs    04/27/20 0445 04/28/20 0719 04/29/20 0632  NA 139 142 140  K 4.5 4.5 3.9  CL 100 101 100  CO2 22 23 25   GLUCOSE 69* 75 97  BUN 49* 57* 27*  CREATININE 8.44* 9.73* 5.84*  CALCIUM 7.3* 7.4* 7.6*   LFT Recent Labs    04/29/20 0109  ALBUMIN 2.0*    Assessment / Plan: *Recurrent GI bleed with history of AVMs:Enteroscopy earlier this month with one nonbleeding duodenal AVM. 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 Patient refused to prep for colonoscopy x 2 during last hospitalization earlier this month.Now having bloody bowel movements again.  *Anemia:Multifactorial due to chronic kidney disease and recurrent GI bleeding. Hemoglobin dow about a gram today from 8.6 grams>>7.7 grams>>8.1 grams  today.  *ESRDon HD  *Acute metabolic encephalopathy:Likely secondary to SIRS/right foot gangrene-uremia due to missed HD.Significantly improved after treatment of SIRS/foot infection with gangrene and dialysis-nowback to baseline.  *Right foot gangrene:For angiogram and possible amputation on Monday, May 3.  *History of paroxysmal atrial fibrillation/atrial flutter as well as history of CVA, but not on anticoagulation due to issues with recurrent GI bleeding.  *Covid positive, patient asymptomatic.  -Patient refused colonoscopy prep again.  Spoke with Dr. Sloan Leiter who is asking [place] to have her attempt one more time for tomorrow.  He will speak with vascular surgery about postponing her procedure with them until Tuesday.  She will need anti-platelet therapy so really need to look for an treat any AVMs that may continue to bleed.  Patient scheduled with Dr. Tarri Glenn for tomorrow, 5/3 tentatively. -Monitor Hgb and transfuse prn. -Clear liquids today then NPO after midnight. -Dulcolax 20 mg x 2 ordered for today then she will drink Moviprep this evening as well to be sure that she is prepped adequately.   LOS: 4 days   [name] [name]. [name] [name]  04/29/2020, 11:12 AM

## 2020-04-29 NOTE — Progress Notes (Signed)
Pt was awake for most of the night.  Pt had at least 2 bowel movements - one was described as bloody by the nurse tech.  IV at the beginning of the shift was removed due to malposition.  IV team placed another - this one infiltrated while administering antibiotics.  IV team has again been consulted for new IV access.  Pt has a new IV in her right hand.    Pt was NOT compliant with bowel prep.  Even after being prompted multiple times to drink the bowel prep and educated on the need to drink it, the pt would respond, "I will get to it in a little bit."  Pt did not "get to it."  I estimate that the pt had only 25% of the prep.  Pt repeatedly asked for snacks and drink after midnight.  Pt re-educated on diet status and reasoning. No acute overnight events other than what has been charted tonight.

## 2020-04-30 ENCOUNTER — Ambulatory Visit (HOSPITAL_COMMUNITY)
Admission: RE | Admit: 2020-04-30 | Payer: Medicare (Managed Care) | Source: Home / Self Care | Admitting: Vascular Surgery

## 2020-04-30 ENCOUNTER — Encounter (HOSPITAL_COMMUNITY): Admission: EM | Disposition: E | Payer: Self-pay | Source: Skilled Nursing Facility | Attending: Internal Medicine

## 2020-04-30 LAB — CULTURE, BLOOD (ROUTINE X 2)
Culture: NO GROWTH
Culture: NO GROWTH

## 2020-04-30 LAB — RENAL FUNCTION PANEL
Albumin: 1.9 g/dL — ABNORMAL LOW (ref 3.5–5.0)
Anion gap: 15 (ref 5–15)
BUN: 30 mg/dL — ABNORMAL HIGH (ref 8–23)
CO2: 25 mmol/L (ref 22–32)
Calcium: 7.2 mg/dL — ABNORMAL LOW (ref 8.9–10.3)
Chloride: 100 mmol/L (ref 98–111)
Creatinine, Ser: 7.23 mg/dL — ABNORMAL HIGH (ref 0.44–1.00)
GFR calc Af Amer: 6 mL/min — ABNORMAL LOW (ref >60)
GFR calc non Af Amer: 5 mL/min — ABNORMAL LOW (ref >60)
Glucose, Bld: 106 mg/dL — ABNORMAL HIGH (ref 70–99)
Phosphorus: 4.6 mg/dL (ref 2.5–4.6)
Potassium: 3.5 mmol/L (ref 3.5–5.1)
Sodium: 140 mmol/L (ref 135–145)

## 2020-04-30 LAB — CBC
HCT: 23.3 % — ABNORMAL LOW (ref 36.0–46.0)
Hemoglobin: 7.1 g/dL — ABNORMAL LOW (ref 12.0–15.0)
MCH: 25.9 pg — ABNORMAL LOW (ref 26.0–34.0)
MCHC: 30.5 g/dL (ref 30.0–36.0)
MCV: 85 fL (ref 80.0–100.0)
Platelets: 290 10*3/uL (ref 150–400)
RBC: 2.74 MIL/uL — ABNORMAL LOW (ref 3.87–5.11)
RDW: 18.9 % — ABNORMAL HIGH (ref 11.5–15.5)
WBC: 7 10*3/uL (ref 4.0–10.5)
nRBC: 0.4 % — ABNORMAL HIGH (ref 0.0–0.2)

## 2020-04-30 LAB — GLUCOSE, CAPILLARY
Glucose-Capillary: 103 mg/dL — ABNORMAL HIGH (ref 70–99)
Glucose-Capillary: 80 mg/dL (ref 70–99)
Glucose-Capillary: 135 mg/dL — ABNORMAL HIGH (ref 70–99)
Glucose-Capillary: 243 mg/dL — ABNORMAL HIGH (ref 70–99)

## 2020-04-30 LAB — PREPARE RBC (CROSSMATCH)

## 2020-04-30 SURGERY — ABDOMINAL AORTOGRAM W/LOWER EXTREMITY
Anesthesia: LOCAL | Laterality: Bilateral

## 2020-04-30 MED ORDER — SODIUM CHLORIDE 0.9% IV SOLUTION: Freq: Once | INTRAVENOUS | Status: AC

## 2020-04-30 MED ORDER — ACETAMINOPHEN 325 MG PO TABS
650.0000 mg | ORAL_TABLET | Freq: Once | ORAL | Status: AC
  Administered 2020-04-30: 650 mg via ORAL
  Filled 2020-04-30 (×2): qty 2

## 2020-04-30 MED ORDER — SODIUM CHLORIDE 0.9 % IV SOLN: INTRAVENOUS | Status: DC

## 2020-04-30 MED ORDER — BISACODYL 5 MG PO TBEC
20.0000 mg | DELAYED_RELEASE_TABLET | Freq: Once | ORAL | Status: AC
  Administered 2020-04-30: 20 mg via ORAL
  Filled 2020-04-30: qty 4

## 2020-04-30 MED ORDER — POLYETHYLENE GLYCOL 3350 17 G PO PACK
17.0000 g | PACK | Freq: Two times a day (BID) | ORAL | Status: DC
  Administered 2020-04-30 – 2020-05-07 (×6): 17 g via ORAL
  Filled 2020-04-30 (×13): qty 1

## 2020-04-30 MED ORDER — BISACODYL 5 MG PO TBEC: 20.0000 mg | DELAYED_RELEASE_TABLET | Freq: Once | ORAL | Status: DC

## 2020-04-30 MED ORDER — POLYETHYLENE GLYCOL 3350 17 GM/SCOOP PO POWD
1.0000 | Freq: Once | ORAL | Status: AC
  Administered 2020-04-30: 255 g via ORAL
  Filled 2020-04-30: qty 255

## 2020-04-30 MED ORDER — SUCROFERRIC OXYHYDROXIDE 500 MG PO CHEW
1000.0000 mg | CHEWABLE_TABLET | Freq: Three times a day (TID) | ORAL | Status: DC
  Administered 2020-05-02 – 2020-05-05 (×10): 1000 mg via ORAL
  Filled 2020-04-30 (×11): qty 2

## 2020-04-30 MED ORDER — PANTOPRAZOLE SODIUM 40 MG PO TBEC
40.0000 mg | DELAYED_RELEASE_TABLET | Freq: Every day | ORAL | Status: DC
  Administered 2020-04-30 – 2020-05-09 (×8): 40 mg via ORAL
  Filled 2020-04-30 (×8): qty 1

## 2020-04-30 MED ORDER — CHLORHEXIDINE GLUCONATE CLOTH 2 % EX PADS
6.0000 | MEDICATED_PAD | Freq: Every day | CUTANEOUS | Status: DC
  Administered 2020-05-01: 6 via TOPICAL

## 2020-04-30 MED ORDER — DIPHENHYDRAMINE HCL 50 MG/ML IJ SOLN
25.0000 mg | Freq: Once | INTRAMUSCULAR | Status: AC
  Administered 2020-04-30: 25 mg via INTRAVENOUS
  Filled 2020-04-30 (×2): qty 1

## 2020-04-30 NOTE — Progress Notes (Addendum)
PROGRESS NOTE                                                                                                                                                                                                             Patient Demographics:    Antara Brecheisen, is a 69 y.o. female, DOB - Jun 11, 1951, HYI:[id number]  Outpatient Primary MD for the patient is [place] date - 04/20/2020   LOS - 5  Chief Complaint  Patient presents with  . Altered Mental Status       Brief Narrative: Patient is a 69 y.o. female with PMHx of ESRD, PAD-s/p left great toe amputation on 04/11/2020, CAD s/p CABG, DM-2, HTN-who was transferred from her SNF for evaluation of acute metabolic encephalopathy in the setting of gangrenous changes to the right foot.  She was also incidentally found to have COVID-19.  See below for further details  Significant Events: 4/6-4/22>> admit to [facility] for upper GI bleeding and blood loss anemia, left great toe osteo s/p amputation 4/28>> admit to [facility] for acute metabolic encephalopathy 7/86>> refused to sign consent for angiogram/amputation-rescheduled for 5/3 4/30>> bloody stools  5/2>> colonoscopy postponed-incomplete prep-due to refusal to complete bowel prep 5/3>> colonoscopy postponed-incomplete prep-due to refusal to complete bowel prep, lower extremity angiogram postponed-due to GI bleeding.  COVID-19 medications: Remdesivir: 4/28>> 4/29  Antibiotics: Vancomycin: 4/28>>5/1 Zosyn: 4/28>>  Microbiology data: 4/28: Blood culture>> no growth 4/28: Wound swab/superficial culture>> Pseudomonas  DVT prophylaxis: Hold Heparin 4/30 2/2 GI bleed  Procedures: 4/14>> left great toe amputation 4/10>> EGD  Consults: VVS, renal,GI    Subjective:   1 bloody BM overnight-2 bloody BMs this morning.  Refused to finish her bowel prep-only 25% consumed.  Colonoscopy again  postponed.   Assessment  & Plan :   Acute metabolic encephalopathy: Likely secondary to SIRS/right foot gangrene-uremia due to missed HD.  Significantly improved after treatment of SIRS/foot infection with gangrene and dialysis-now back to baseline.  SIRS (present on admission) with right lower extremity critical limb ischemia: Afebrile-SIRS physiology has improved-has gangrenous changes evident on exam-please see pictures taken on 4/28 and prior notes.  Blood cultures negative so far.    Superficial swab on admission positive for Pseudomonas-not sure how accurate this is. No longer on vancomycin-continue Zosyn. Refused angiogram/amputation on 4/29-was rescheduled  for 5/3-however with her ongoing GI bleeding-angiogram canceled for now.  Patient aware that if GI bleeding is not addressed-we may not be able to do an angiogram/PCI-she may end up with a more proximal amputation.  History of PAD with recent left great toe amputation-now with nonhealing left foot wound: Vascular surgery planning debridement when she consents for procedure.  Remains on statin-aspirin held due to possible GI bleeding.  Recurrent upper GI bleeding-history of AVMs and acute blood loss anemia: Significant drop in hemoglobin to 7.1 today-continues to have some bloody BMs-we will go and transfuse 1 unit of PRBC today.  Follow posttransfusion CBC.  Unfortunately-refuses to finish her colonoscopy prep-only wanting to eat-have spoken at length with patient today-then with patient's aunt over the phone-both are aware that if patient refuses to cooperate-there is only so much we can do.  Both are aware that unless GI bleeding is addressed-angiogram of her lower extremities will have to be postponed.  GI continues to follow-reschedule for colonoscopy on 5/4.  Hyperkalemia: Secondary to missed HD-resolved with HD.  Follow periodically.  ESRD: Nephrology following and directing care.  Anemia: Multifactorial-likely secondary to kidney  disease-worsened by blood loss-being transfused 1 unit of PRBC.  Iron/Aranesp per nephrology.  HTN: BP relatively well controlled-continue metoprolol  DM-2 with hypoglycemia (A1C 5.1 on 04/03/2020): No further hypoglycemic episodes-continue to hold all insulin for now. Recent Labs    04/29/20 2340 05/25/2020 0739 05/26/2020 1222  GLUCAP 120* 103* 80   Paroxysmal atrial fibrillation/atrial flutter: Not a new diagnosis-Per discharge summary in October 2020-she also had atrial fibrillation  as well.  Given recurrent GI bleeding/anemia-not a good candidate for anticoagulation.  Continue rate control with metoprolol.  Chronic combined diastolic and systolic heart failure (EF 45-50% by TTE on 04/28/2019): Volume status stable-diuresis with HD.  History of CVA: Continue statin-see above regarding aspirin and anticoagulation  Gout: Not in flare-continue allopurinol  Covid 19 Viral infection: Appears to be asymptomatic--on room air this morning.  Chest x-ray on admission did not show pneumonia.  Fever: afebrile  O2 requirements:  SpO2: 95 % O2 Flow Rate (L/min): 2 L/min   COVID-19 Labs: No results for input(s): DDIMER, FERRITIN, LDH, CRP in the last 72 hours.     Component Value Date/Time   BNP 897.8 (H) 01/01/2020 0600    No results for input(s): PROCALCITON in the last 168 hours.  Lab Results  Component Value Date   SARSCOV2NAA POSITIVE (A) 04/18/2020   [place] NEGATIVE 04/16/2020   [place] NEGATIVE 04/03/2020   [place] NEGATIVE 03/15/2020     Morbid Obesity: Estimated body mass index is 39.2 kg/m as calculated from the following:   Height as of 04/11/20: 5' 7.99" (1.727 m).   Weight as of this encounter: 116.9 kg.     ABG:    Component Value Date/Time   PHART 7.419 02/05/2020 1114   PCO2ART 43.0 02/05/2020 1114   PO2ART 85.1 02/05/2020 1114   HCO3 27.2 02/05/2020 1114   TCO2 22 01/01/2020 0614   ACIDBASEDEF 6.0 (H) 01/01/2020 0614   O2SAT 95.3 02/05/2020  1114    Vent Settings: N/A    Condition - Guarded  Family Communication  : Spoke to aunt on 5/3.  Code Status :  Full Code  Diet :  Diet Order            Diet NPO time specified  Diet effective midnight        Diet clear liquid Room service appropriate? Yes; Fluid consistency: Thin  Diet  effective now               Disposition Plan  :  Status is: Inpatient  Remains inpatient appropriate because:Inpatient level of care appropriate due to severity of illness  Dispo: The patient is from: SNF              Anticipated d/c is to: SNF              Anticipated d/c date is: > 3 days              Patient currently is not medically stable to d/c.   Barriers to discharge: Right foot gangrene-requiring amputation-remains on IV antibiotics  Antimicorbials  :    Anti-infectives (From admission, onward)   Start     Dose/Rate Route Frequency Ordered Stop   04/28/20 1200  vancomycin (VANCOCIN) IVPB 1000 mg/200 mL premix  Status:  Discontinued     1,000 mg 200 mL/hr over 60 Minutes Intravenous Every T-Th-Sa (Hemodialysis) 04/27/20 1154 04/28/20 1218   04/26/20 1400  piperacillin-tazobactam (ZOSYN) IVPB 2.25 g     2.25 g 100 mL/hr over 30 Minutes Intravenous Every 8 hours 04/26/20 1314     04/26/20 1000  remdesivir 100 mg in sodium chloride 0.9 % 100 mL IVPB  Status:  Discontinued     100 mg 200 mL/hr over 30 Minutes Intravenous Daily 03/29/2020 1952 04/26/20 1105   03/31/2020 2030  remdesivir 200 mg in sodium chloride 0.9% 250 mL IVPB     200 mg 580 mL/hr over 30 Minutes Intravenous Once 03/30/2020 1952 03/30/2020 2327   04/20/2020 1515  vancomycin (VANCOCIN) 2,500 mg in sodium chloride 0.9 % 500 mL IVPB     2,500 mg 250 mL/hr over 120 Minutes Intravenous  Once 04/13/2020 1513 04/02/2020 2026   04/22/2020 1513  vancomycin variable dose per unstable renal function (pharmacist dosing)  Status:  Discontinued      Does not apply See admin instructions 04/01/2020 1514 04/28/20 1218   04/15/2020 1500   piperacillin-tazobactam (ZOSYN) IVPB 3.375 g     3.375 g 100 mL/hr over 30 Minutes Intravenous  Once 04/21/2020 1450 04/17/2020 2026      Inpatient Medications  Scheduled Meds: . allopurinol  100 mg Oral Daily  . atorvastatin  40 mg Oral q1800  . buPROPion  150 mg Oral BID  . calcitRIOL  2.25 mcg Oral Q T,Th,Sat-1800  . Chlorhexidine Gluconate Cloth  6 each Topical Q0600  . [START ON 05/12/2020] Chlorhexidine Gluconate Cloth  6 each Topical Q0600  . cinacalcet  30 mg Oral Q T,Th,Sat-1800  . darbepoetin (ARANESP) injection - DIALYSIS  150 mcg Intravenous Q Sat-HD  . gabapentin  300 mg Oral QHS  . linaclotide  72 mcg Oral QAC breakfast  . loratadine  10 mg Oral Daily  . metoprolol tartrate  25 mg Oral BID  . pantoprazole  40 mg Oral Daily  . peg 3350 powder  0.5 kit Oral Once  . polyethylene glycol  17 g Oral BID  . sodium chloride flush  3 mL Intravenous Q12H  . [START ON 05/02/2020] sucroferric oxyhydroxide  1,000 mg Oral TID WC   Continuous Infusions: . sodium chloride    . sodium chloride    . sodium chloride    . piperacillin-tazobactam (ZOSYN)  IV Stopped (05/27/2020 1157)   PRN Meds:.sodium chloride, sodium chloride, sodium chloride, acetaminophen, albuterol, alteplase, heparin, lidocaine (PF), lidocaine-prilocaine, metoprolol tartrate, mometasone-formoterol, nitroGLYCERIN, oxyCODONE, pentafluoroprop-tetrafluoroeth, sodium chloride flush   Time Spent in  minutes 25   See all Orders from today for further details   Oren Binet M.D on 05/25/2020 at 2:27 PM  To page go to [email or web address] - use universal password  [facility] -  Office  [phone]    Objective:   Vitals:   04/29/20 1657 04/29/20 2142 05/09/2020 0000 05/16/2020 1415  BP:  (!) 141/109  109/64  Pulse:   65 80  Resp:  _0 Temp: 97.9 F (36.6 C) 98.5 F (36.9 C)  98.4 F (36.9 C)  TempSrc: Oral Oral  Oral  SpO2:  94% 99% 95%  Weight:        Wt Readings from Last 3 Encounters:  04/28/20  116.9 kg  04/19/20 118.4 kg  03/02/20 120.2 kg     Intake/Output Summary (Last 24 hours) at 05/12/2020 1427 Last data filed at 05/17/2020 1157 Gross per 24 hour  Intake 290 ml  Output --  Net 290 ml     Physical Exam Gen Exam:Alert awake-not in any distress HEENT:atraumatic, normocephalic Chest: B/L clear to auscultation anteriorly CVS:S1S2 regular Abdomen:soft non tender, non distended Extremities:trace edema Neurology: Non focal Skin: no rash   Data Review:    CBC Recent Labs  Lab 04/08/2020 1315 04/26/20 1630 04/27/20 0445 04/27/20 1343 04/28/20 0719 04/29/20 0632 05/06/2020 0500  WBC 7.9   < > 8.0 7.8 7.6 7.2 7.0  HGB 8.8*   < > 8.5* 8.6* 7.7* 8.1* 7.1*  HCT 29.0*   < > 28.4* 28.3* 25.0* 26.7* 23.3*  PLT 416*   < > 315 322 313 311 290  MCV 86.1   < > 86.9 86.5 84.5 84.5 85.0  MCH 26.1   < > 26.0 26.3 26.0 25.6* 25.9*  MCHC 30.3   < > 29.9* 30.4 30.8 30.3 30.5  RDW 18.9*   < > 19.0* 18.9* 19.0* 18.6* 18.9*  LYMPHSABS 1.3  --   --   --  1.3  --   --   MONOABS 0.7  --   --   --  0.6  --   --   EOSABS 0.0  --   --   --  0.1  --   --   BASOSABS 0.1  --   --   --  0.0  --   --    < > = values in this interval not displayed.    Chemistries  Recent Labs  Lab 04/10/2020 1315 04/26/20 0500 04/26/20 1630 04/27/20 0445 04/28/20 0719 04/29/20 0632 05/11/2020 0500  NA 138   < > 142 139 142 140 140  K 6.6*   < > 4.8 4.5 4.5 3.9 3.5  CL 97*   < > 101 100 101 100 100  CO2 22   < > _1 GLUCOSE 89   < > 79 69* 75 97 106*  BUN 81*   < > 41* 49* 57* 27* 30*  CREATININE 11.32*   < > 7.61* 8.44* 9.73* 5.84* 7.23*  CALCIUM 8.2*   < > 7.7* 7.3* 7.4* 7.6* 7.2*  AST 21  --   --   --   --   --   --   ALT 7  --   --   --   --   --   --   ALKPHOS 102  --   --   --   --   --   --   BILITOT 0.7  --   --   --   --   --   --    < > =  values in this interval not displayed.    ------------------------------------------------------------------------------------------------------------------ No results for input(s): CHOL, HDL, LDLCALC, TRIG, CHOLHDL, LDLDIRECT in the last 72 hours.  Lab Results  Component Value Date   HGBA1C 5.1 04/03/2020   ------------------------------------------------------------------------------------------------------------------ No results for input(s): TSH, T4TOTAL, T3FREE, THYROIDAB in the last 72 hours.  Invalid input(s): FREET3 ------------------------------------------------------------------------------------------------------------------ No results for input(s): VITAMINB12, FOLATE, FERRITIN, TIBC, IRON, RETICCTPCT in the last 72 hours.  Coagulation profile Recent Labs  Lab 04/27/2020 1315  INR 1.3*    No results for input(s): DDIMER in the last 72 hours.  Cardiac Enzymes No results for input(s): CKMB, TROPONINI, MYOGLOBIN in the last 168 hours.  Invalid input(s): CK ------------------------------------------------------------------------------------------------------------------    Component Value Date/Time   BNP 897.8 (H) 01/01/2020 0600    Micro Results Recent Results (from the past 240 hour(s))  Culture, blood (Routine x 2)     Status: None   Collection Time: 04/17/2020  1:00 PM   Specimen: BLOOD  Result Value Ref Range Status   Specimen Description BLOOD RIGHT ANTECUBITAL  Final   Special Requests   Final    BOTTLES DRAWN AEROBIC AND ANAEROBIC Blood Culture results may not be optimal due to an inadequate volume of blood received in culture bottles   Culture   Final    NO GROWTH 5 DAYS Performed at [facility], [place] [address], [place], [place] [address]    Report Status 04/28/2020 FINAL  Final  Culture, blood (Routine x 2)     Status: None   Collection Time: 04/26/2020  1:20 PM   Specimen: BLOOD RIGHT HAND  Result Value Ref Range Status   Specimen Description BLOOD RIGHT HAND  Final   Special  Requests   Final    BOTTLES DRAWN AEROBIC ONLY Blood Culture results may not be optimal due to an inadequate volume of blood received in culture bottles   Culture   Final    NO GROWTH 5 DAYS Performed at [facility], [place] [address], [place], [place] [address]    Report Status 05/06/2020 FINAL  Final  Respiratory Panel by RT PCR (Flu A&B, Covid) - Nasopharyngeal Swab     Status: Abnormal   Collection Time: 04/11/2020  3:58 PM   Specimen: Nasopharyngeal Swab  Result Value Ref Range Status   SARS Coronavirus 2 by RT PCR POSITIVE (A) NEGATIVE Final    Comment: RESULT CALLED TO, READ BACK BY AND VERIFIED WITH: K [facility] RN 04/26/2020 1751 JDW (NOTE) SARS-CoV-2 target nucleic acids are DETECTED. SARS-CoV-2 RNA is generally detectable in upper respiratory specimens  during the acute phase of infection. Positive results are indicative of the presence of the identified virus, but do not rule out bacterial infection or co-infection with other pathogens not detected by the test. Clinical correlation with patient history and other diagnostic information is necessary to determine patient infection status. The expected result is Negative. Fact Sheet for Patients:  PinkCheek.be Fact Sheet for Healthcare Providers: GravelBags.it This test is not yet approved or cleared by the [place] FDA and  has been authorized for detection and/or diagnosis of SARS-CoV-2 by FDA under an Emergency Use Authorization (EUA).  This EUA will remain in effect (meaning this test can be used) for th e duration of  the COVID-19 declaration under Section 564(b)(1) of the Act, 21 U.S.C. section 360bbb-3(b)(1), unless the authorization is terminated or revoked sooner.    Influenza A by PCR NEGATIVE NEGATIVE Final   Influenza B by PCR NEGATIVE  NEGATIVE Final    Comment: (NOTE) The Xpert Xpress SARS-CoV-2/FLU/RSV assay is intended as an aid in  the diagnosis  of influenza from Nasopharyngeal swab specimens and  should not be used as a sole basis for treatment. Nasal washings and  aspirates are unacceptable for Xpert Xpress SARS-CoV-2/FLU/RSV  testing. Fact Sheet for Patients: PinkCheek.be Fact Sheet for Healthcare Providers: GravelBags.it This test is not yet approved or cleared by the [place] FDA and  has been authorized for detection and/or diagnosis of SARS-CoV-2 by  FDA under an Emergency Use Authorization (EUA). This EUA will remain  in effect (meaning this test can be used) for the duration of the  Covid-19 declaration under Section 564(b)(1) of the Act, 21  U.S.C. section 360bbb-3(b)(1), unless the authorization is  terminated or revoked. Performed at [facility], [place] [address], [place], [place] [address]   Wound or Superficial Culture     Status: None   Collection Time: 04/08/2020  8:43 PM   Specimen: Toe; Wound  Result Value Ref Range Status   Specimen Description TOE  Final   Special Requests RIGHT  Final   Gram Stain   Final    NO WBC SEEN NO ORGANISMS SEEN Performed at [facility], [address] [place], [place] [address]    Culture RARE PSEUDOMONAS AERUGINOSA  Final   Report Status 04/28/2020 FINAL  Final   Organism ID, Bacteria PSEUDOMONAS AERUGINOSA  Final      Susceptibility   Pseudomonas aeruginosa - MIC*    CEFTAZIDIME 4 SENSITIVE Sensitive     CIPROFLOXACIN <=0.25 SENSITIVE Sensitive     GENTAMICIN 4 SENSITIVE Sensitive     IMIPENEM 2 SENSITIVE Sensitive     PIP/TAZO 8 SENSITIVE Sensitive     CEFEPIME 4 SENSITIVE Sensitive     * RARE PSEUDOMONAS AERUGINOSA    Radiology Reports DG Chest 2 View  Result Date: 04/11/2020 CLINICAL DATA:  69 year old female with suspected sepsis. Bilateral foot infection for the past 3 weeks. EXAM: CHEST - 2 VIEW COMPARISON:  Chest x-ray 04/04/2020. FINDINGS: Lung volumes are normal. No consolidative  airspace disease. No pleural effusions. No evidence of frank pulmonary edema. Mild cephalization of the pulmonary vasculature. Mild cardiomegaly. Upper mediastinal contours are within normal limits. Aortic atherosclerosis. Status post median sternotomy for CABG. Right internal jugular PermCath with tip terminating in the mid to distal superior vena cava. Aortic atherosclerosis. IMPRESSION: 1. Postoperative changes and support apparatus, as above. 2. Mild cardiomegaly with pulmonary venous congestion, without frank pulmonary edema. 3. Aortic atherosclerosis. Electronically Signed   By: Vinnie Langton M.D.   On: 04/09/2020 14:52   CT FOOT RIGHT WO CONTRAST  Result Date: 04/12/2020 CLINICAL DATA:  Pain and swelling. EXAM: CT OF THE RIGHT FOOT WITHOUT CONTRAST TECHNIQUE: Multidetector CT imaging of the right foot was performed according to the standard protocol. Multiplanar CT image reconstructions were also generated. COMPARISON:  Radiographs 04/09/2020 FINDINGS: Diffuse subcutaneous soft tissue swelling/edema/fluid involving the entire ankle and foot. No focal fluid collection to suggest a drainable soft tissue abscess. Marked fatty atrophy of the foot and ankle musculature. Suspect myofasciitis involving the plantar foot musculature but no definite findings for pyomyositis. Suspect an open wound along the plantar aspect of the great toe but I do not see any obvious destructive bony changes to suggest osteomyelitis. No definite findings for septic arthritis. The other bony structures of the foot and ankle appear intact. No obvious destructive bony changes, fractures or osteochondral lesions. Extensive  small artery calcifications. IMPRESSION: 1. Diffuse subcutaneous soft tissue swelling/edema/fluid involving the entire ankle and foot consistent with cellulitis. No focal fluid collection to suggest a drainable soft tissue abscess. 2. Suspect myofasciitis involving the plantar foot musculature but no definite  findings for pyomyositis. Severe diffuse fatty atrophy of the foot and ankle musculature. 3. Suspect an open wound along the plantar aspect of the great toe but I do not see any obvious destructive bony changes to suggest osteomyelitis. No definite findings for septic arthritis. 4. If symptoms persist or worsen MRI is suggested for further evaluation. Electronically Signed   By: Marijo Sanes M.D.   On: 04/12/2020 10:51   DG CHEST PORT 1 VIEW  Result Date: 04/04/2020 CLINICAL DATA:  69 year old female with fever. EXAM: PORTABLE CHEST 1 VIEW COMPARISON:  Chest radiograph dated 02/24/2020. FINDINGS: Right-sided dialysis catheter in similar position. There is cardiomegaly with mild vascular congestion. No focal consolidation, pleural effusion or pneumothorax. Median sternotomy wires and CABG vascular clips. No acute osseous pathology. IMPRESSION: Cardiomegaly with mild vascular congestion. No focal consolidation. Electronically Signed   By: Anner Crete M.D.   On: 04/04/2020 17:29   DG ABD ACUTE 2+V W 1V CHEST  Result Date: 04/09/2020 CLINICAL DATA:  Fever with low hemoglobin. EXAM: DG ABDOMEN ACUTE W/ 1V CHEST COMPARISON:  Abdominal film 06/26/2018 and chest x-ray 02/24/2020 FINDINGS: Right IJ central venous catheter unchanged with tip over the SVC. Lungs are adequately inflated with minimal hazy prominence of the perihilar vessels unchanged and likely due to mild chronic vascular congestion. No lobar consolidation or effusion. Mild stable cardiomegaly. Remainder the chest is unchanged. There are several air-filled large and small bowel loops. A few air-filled small bowel loops at the upper limits of normal in diameter over the left abdomen. No evidence of air-fluid levels or free peritoneal air. Remainder of the exam is unchanged. IMPRESSION: Nonspecific, nonobstructive bowel gas pattern with a few air-filled mildly prominent, but nondilated small bowel loops in the left abdomen. Stable mild cardiomegaly  with suggestion of minimal chronic vascular congestion. Electronically Signed   By: Marin Olp M.D.   On: 04/09/2020 09:57   DG Foot Complete Left  Result Date: 04/27/2020 CLINICAL DATA:  Recent amputation. Suspect sepsis. Bilateral foot infection 3 weeks. EXAM: LEFT FOOT - COMPLETE 3+ VIEW COMPARISON:  04/09/2020 FINDINGS: There is generalized decreased bone mineralization. Evidence of patient's recent amputation distal to the distal shaft of the first metatarsal. Expected postsurgical changes at the amputation site. No focal bone destruction to suggest osteomyelitis. No air within the soft tissues. Exam is otherwise unchanged. IMPRESSION: Expected changes post amputation distal to the distal aspect of the first metatarsal. No definite bone destruction to suggest osteomyelitis. No air within the soft tissues. Electronically Signed   By: Marin Olp M.D.   On: 04/22/2020 14:58   DG Foot Complete Left  Result Date: 04/09/2020 CLINICAL DATA:  Foot pain and swelling EXAM: LEFT FOOT - COMPLETE 3+ VIEW COMPARISON:  02/01/2020 MRI, 01/31/2020 plain film FINDINGS: There is been interval amputation of the first toe. Persistent soft tissue wound is seen. There is lucency identified in the head of the first metatarsal suspicious for recurrent osteomyelitis. No other fracture or dislocation is seen. No other soft tissue abnormality is noted. IMPRESSION: Changes suspicious for osteomyelitis in the head of the first metatarsal. Overlying soft tissue wound is noted. Electronically Signed   By: Inez Catalina M.D.   On: 04/09/2020 19:40   DG Foot Complete Right  Result Date: 04/01/2020 CLINICAL DATA:  Recent amputation with possible infection three weeks. Suspect sepsis. EXAM: RIGHT FOOT COMPLETE - 3+ VIEW COMPARISON:  CT 04/12/2020 FINDINGS: Diffuse decreased bone mineralization. Degenerative change over the first MTP joint and interphalangeal joints. Mild horizontal sclerosis with subtle lucency involving the head  of the fifth metatarsal possibly subacute fracture. No focal bone destruction to suggest osteomyelitis. No air within the soft tissues. IMPRESSION: 1. No definite evidence of osteomyelitis and no air within the soft tissues. 2. Linear sclerosis with subtle lucency involving the head of the fifth metatarsal which may be due to nondisplaced subacute fracture. Electronically Signed   By: Marin Olp M.D.   On: 04/01/2020 14:56   VAS [place] LOWER EXTREMITY VENOUS (DVT)  Result Date: 04/10/2020  Lower Venous DVTStudy Indications: Edema, and fever.  Limitations: Body habitus and poor ultrasound/tissue interface. Comparison Study: 04/28/19 previous Performing Technologist: Abram Sander RVS  Examination Guidelines: A complete evaluation includes B-mode imaging, spectral Doppler, color Doppler, and power Doppler as needed of all accessible portions of each vessel. Bilateral testing is considered an integral part of a complete examination. Limited examinations for reoccurring indications may be performed as noted. The reflux portion of the exam is performed with the patient in reverse Trendelenburg.  +---------+---------------+---------+-----------+----------+--------------+ RIGHT    CompressibilityPhasicitySpontaneityPropertiesThrombus Aging +---------+---------------+---------+-----------+----------+--------------+ CFV      Full           Yes      Yes                                 +---------+---------------+---------+-----------+----------+--------------+ SFJ      Full                                                        +---------+---------------+---------+-----------+----------+--------------+ FV Prox  Full                                                        +---------+---------------+---------+-----------+----------+--------------+ FV Mid   Full                                                        +---------+---------------+---------+-----------+----------+--------------+ FV  DistalFull                                                        +---------+---------------+---------+-----------+----------+--------------+ PFV      Full                                                        +---------+---------------+---------+-----------+----------+--------------+ POP      Full  Yes      Yes                                 +---------+---------------+---------+-----------+----------+--------------+ PTV      Full                                                        +---------+---------------+---------+-----------+----------+--------------+ PERO                                                  Not visualized +---------+---------------+---------+-----------+----------+--------------+   +---------+---------------+---------+-----------+----------+--------------+ LEFT     CompressibilityPhasicitySpontaneityPropertiesThrombus Aging +---------+---------------+---------+-----------+----------+--------------+ CFV      Full           Yes      Yes                                 +---------+---------------+---------+-----------+----------+--------------+ SFJ      Full                                                        +---------+---------------+---------+-----------+----------+--------------+ FV Prox  Full                                                        +---------+---------------+---------+-----------+----------+--------------+ FV Mid   Full                                                        +---------+---------------+---------+-----------+----------+--------------+ FV DistalFull                                                        +---------+---------------+---------+-----------+----------+--------------+ PFV      Full                                                        +---------+---------------+---------+-----------+----------+--------------+ POP      Full           Yes      Yes                                  +---------+---------------+---------+-----------+----------+--------------+ PTV      Full                                                        +---------+---------------+---------+-----------+----------+--------------+  PERO                                                  Not visualized +---------+---------------+---------+-----------+----------+--------------+     Summary: BILATERAL: - No evidence of deep vein thrombosis seen in the lower extremities, bilaterally.   *See table(s) above for measurements and observations. Electronically signed by Deitra Mayo MD on 04/10/2020 at 3:24:49 PM.    Final

## 2020-04-30 NOTE — Plan of Care (Signed)
  Problem: Education: Goal: Knowledge of General Education information will improve Description: Including pain rating scale, medication(s)/side effects and non-pharmacologic comfort measures Outcome: Not Progressing   Problem: Health Behavior/Discharge Planning: Goal: Ability to manage health-related needs will improve Outcome: Not Progressing   Problem: Clinical Measurements: Goal: Diagnostic test results will improve Outcome: Not Progressing   Problem: Education: Goal: Knowledge of General Education information will improve Description: Including pain rating scale, medication(s)/side effects and non-pharmacologic comfort measures Outcome: Not Progressing  Pt non compliant with current POC including bowel prep.

## 2020-04-30 NOTE — Progress Notes (Signed)
Once again patient did not complete bowel prep thus today's colonoscopy was canceled.  Patient once again says that she will drink the bowel prep, did not want an NG tube placed in order to facilitate completion of bowel prep.  MiraLAX mixed with apple juice prep ordered but patient is black 3 days ago about drinking less.  If patient unable to complete bowel prep, GI is going to cancel any plans for colonoscopy for investigation of rectal bleeding, anemia. Colonoscopy 02/24/19: bleeding AVM at hepatic flexure treated with Endo Clip on 02/25/2020. 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  *   Asymptomatic Covid positive.  *   Osteomyelitis, left foot gangrene.  *   End-stage renal disease, on hemodialysis TTS.  *    Repeated removal of IVs by patient.  Patient was not seen for exam, labs and vital signs reviewed. Hgb 7.1.    Azucena Freed, PA-C

## 2020-04-30 NOTE — Progress Notes (Signed)
[facility] Progress Note  Subjective: seen in room, didn't sleep well last night. No new c/o  Vitals:   04/29/20 1611 04/29/20 1657 04/29/20 2142 05/02/2020 0000  BP: (!) 112/47  (!) 141/109   Pulse: 61   65  Resp: '12  13 17  ' Temp:  97.9 F (36.6 C) 98.5 F (36.9 C)   TempSrc:  Oral Oral   SpO2: 94%  94% 99%  Weight:        Exam:  alert, no distress, calm in bed 45deg  no jvd  chest CTA bilat, R TDC   Cor reg no RG   Abd obese soft ntnd   Ext left foot dressed, R foot w/ ischemic discoloration, no sig LE edema   Neuro NF, ox 3   Dialysis: [facility] TTS    4h 12mn  400/800  118kg  2/2.5 bath  Hep none  TDC  Venofer 100 q HD until 4/17  Mircera 225 (last 4/1)  Sensipar 30 Calcitriol 2.25    Assessment/ Plan: 1. Osteomyelitis left foot/ ischemic changes right leg: Seen by vascular surgery with plans for angiography to help revascularize right lower extremity and staged subsequent amputation of toes with debridement of left foot. 2. Rectal bleeding: Prior history of arteriovenous malformations and seen by gastroenterology with colonoscopy rescheduled due to inadequate prep 3. ESRD: Continue routine hemodialysis on a TTS schedule with next hemodialysis Tuesday 5/4.  4. Anemia: Acute blood loss anemia superimposed on anemia of ESRD/chronic kidney disease.  Hemodynamically stable and received ESA with dialysis yesterday. 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 6. Nutrition: Continue renal diet with fluid restriction and nutritional supplementation to improve wound healing. 7. Hypertension: Blood pressure within acceptable range.  With evidence of chronic volume overload but will continue to follow with dialysis.     Rob Stephany Poorman 05/04/2020, 12:22 PM   Recent Labs  Lab 04/29/20 0632 05/18/2020 0500  K 3.9 3.5  BUN 27* 30*  CREATININE 5.84* 7.23*  CALCIUM 7.6* 7.2*  PHOS 4.0 4.6  HGB 8.1* 7.1*   Inpatient  medications: . allopurinol  100 mg Oral Daily  . atorvastatin  40 mg Oral q1800  . buPROPion  150 mg Oral BID  . calcitRIOL  2.25 mcg Oral Q T,Th,Sat-1800  . Chlorhexidine Gluconate Cloth  6 each Topical Q0600  . cinacalcet  30 mg Oral Q T,Th,Sat-1800  . darbepoetin (ARANESP) injection - DIALYSIS  150 mcg Intravenous Q Sat-HD  . gabapentin  300 mg Oral QHS  . linaclotide  72 mcg Oral QAC breakfast  . loratadine  10 mg Oral Daily  . metoprolol tartrate  25 mg Oral BID  . pantoprazole  40 mg Oral Daily  . peg 3350 powder  0.5 kit Oral Once  . polyethylene glycol  17 g Oral BID  . polyethylene glycol powder  1 Container Oral Once  . sodium chloride flush  3 mL Intravenous Q12H  . [START ON 05/02/2020] sucroferric oxyhydroxide  1,000 mg Oral TID WC   . sodium chloride    . sodium chloride    . sodium chloride    . piperacillin-tazobactam (ZOSYN)  IV Stopped (05/18/2020 1157)   sodium chloride, sodium chloride, sodium chloride, acetaminophen, albuterol, alteplase, heparin, lidocaine (PF), lidocaine-prilocaine, metoprolol tartrate, mometasone-formoterol, nitroGLYCERIN, oxyCODONE, pentafluoroprop-tetrafluoroeth, sodium chloride flush

## 2020-04-30 NOTE — Progress Notes (Addendum)
Awaiting new IV to hang blood as well as antibiotic. Current IV occluded and will no longer flush.   Pt finished all of bowel prep except approximately 171mL, which patient was working on finishing at shift change.

## 2020-04-30 NOTE — Progress Notes (Signed)
Latest IV held up overnight but needed to be reinforced.   Hgb from lab this morning: 7.1  Pt had one large bowel movement, minimal blood.  This incident soiled the bandages on the pt's feet, so those were changed as well.   Pt drank less than 25% of the bowel prep for today's anticipated colonoscopy and she has been NPO since midnight.  I prompted the pt several times on why it was important that she drink the bowel prep in order to have the procedure.  Pt would dismiss me when I would ask her to drink any. No acute events overnight.

## 2020-04-30 NOTE — Progress Notes (Signed)
   PROGRESS NOTE  Patient drank <25% of Moviprep for colonoscopy today. Stools still brown.  Discussed with Dr. Sloan Leiter.   Will continue prep today and reschedule colonoscopy for 05/02/2020.  Patient declined NG tube to facilitate completing the prep.  Clear liquid diet today. Continue Moviprep. Additional doses of Dulcolax and Miralax over the course of today. NPO at midnight.   Please call with any questions.

## 2020-05-01 ENCOUNTER — Inpatient Hospital Stay (HOSPITAL_COMMUNITY): Payer: Medicare (Managed Care) | Admitting: Anesthesiology

## 2020-05-01 ENCOUNTER — Encounter (HOSPITAL_COMMUNITY): Payer: Self-pay | Admitting: Internal Medicine

## 2020-05-01 ENCOUNTER — Encounter (HOSPITAL_COMMUNITY): Admission: EM | Disposition: E | Payer: Self-pay | Source: Skilled Nursing Facility | Attending: Internal Medicine

## 2020-05-01 ENCOUNTER — Inpatient Hospital Stay (HOSPITAL_COMMUNITY): Payer: Medicare (Managed Care)

## 2020-05-01 ENCOUNTER — Other Ambulatory Visit: Payer: Self-pay

## 2020-05-01 DIAGNOSIS — K922 Gastrointestinal hemorrhage, unspecified: Secondary | ICD-10-CM

## 2020-05-01 DIAGNOSIS — K635 Polyp of colon: Secondary | ICD-10-CM

## 2020-05-01 HISTORY — PX: COLONOSCOPY WITH PROPOFOL: SHX5780

## 2020-05-01 LAB — CBC
HCT: 25.6 % — ABNORMAL LOW (ref 36.0–46.0)
Hemoglobin: 8 g/dL — ABNORMAL LOW (ref 12.0–15.0)
MCH: 27.4 pg (ref 26.0–34.0)
MCHC: 31.3 g/dL (ref 30.0–36.0)
MCV: 87.7 fL (ref 80.0–100.0)
Platelets: 301 10*3/uL (ref 150–400)
RBC: 2.92 MIL/uL — ABNORMAL LOW (ref 3.87–5.11)
RDW: 18.7 % — ABNORMAL HIGH (ref 11.5–15.5)
WBC: 7.5 10*3/uL (ref 4.0–10.5)
nRBC: 1.1 % — ABNORMAL HIGH (ref 0.0–0.2)

## 2020-05-01 LAB — RENAL FUNCTION PANEL
Albumin: 1.9 g/dL — ABNORMAL LOW (ref 3.5–5.0)
Anion gap: 20 — ABNORMAL HIGH (ref 5–15)
BUN: 35 mg/dL — ABNORMAL HIGH (ref 8–23)
CO2: 23 mmol/L (ref 22–32)
Calcium: 7.3 mg/dL — ABNORMAL LOW (ref 8.9–10.3)
Chloride: 101 mmol/L (ref 98–111)
Creatinine, Ser: 8.65 mg/dL — ABNORMAL HIGH (ref 0.44–1.00)
GFR calc Af Amer: 5 mL/min — ABNORMAL LOW (ref >60)
GFR calc non Af Amer: 4 mL/min — ABNORMAL LOW (ref >60)
Glucose, Bld: 99 mg/dL (ref 70–99)
Phosphorus: 5.4 mg/dL — ABNORMAL HIGH (ref 2.5–4.6)
Potassium: 3.6 mmol/L (ref 3.5–5.1)
Sodium: 144 mmol/L (ref 135–145)

## 2020-05-01 LAB — TYPE AND SCREEN
ABO/RH(D): O POS
Antibody Screen: NEGATIVE
Unit division: 0

## 2020-05-01 LAB — BPAM RBC
Blood Product Expiration Date: 202105312359
ISSUE DATE / TIME: 202105040202
Unit Type and Rh: 5100

## 2020-05-01 LAB — GLUCOSE, CAPILLARY
Glucose-Capillary: 76 mg/dL (ref 70–99)
Glucose-Capillary: 84 mg/dL (ref 70–99)
Glucose-Capillary: 98 mg/dL (ref 70–99)

## 2020-05-01 SURGERY — CANCELLED PROCEDURE

## 2020-05-01 SURGERY — COLONOSCOPY WITH PROPOFOL
Anesthesia: General

## 2020-05-01 MED ORDER — HEPARIN SODIUM (PORCINE) 1000 UNIT/ML IJ SOLN
INTRAMUSCULAR | Status: AC
  Filled 2020-05-01: qty 4

## 2020-05-01 MED ORDER — PROPOFOL 500 MG/50ML IV EMUL
INTRAVENOUS | Status: DC | PRN
  Administered 2020-05-01: 75 ug/kg/min via INTRAVENOUS

## 2020-05-01 MED ORDER — LIDOCAINE 2% (20 MG/ML) 5 ML SYRINGE
INTRAMUSCULAR | Status: DC | PRN
  Administered 2020-05-01: 50 mg via INTRAVENOUS

## 2020-05-01 MED ORDER — SODIUM CHLORIDE 0.9 % IV SOLN: INTRAVENOUS | Status: DC

## 2020-05-01 MED ORDER — SODIUM CHLORIDE 0.9 % IV SOLN: INTRAVENOUS | Status: DC | PRN

## 2020-05-01 MED ORDER — IOHEXOL 350 MG/ML SOLN
100.0000 mL | Freq: Once | INTRAVENOUS | Status: AC | PRN
  Administered 2020-05-01: 100 mL via INTRAVENOUS

## 2020-05-01 MED ORDER — PROPOFOL 10 MG/ML IV BOLUS
INTRAVENOUS | Status: DC | PRN
  Administered 2020-05-01: 20 mg via INTRAVENOUS

## 2020-05-01 SURGICAL SUPPLY — 22 items

## 2020-05-01 SURGICAL SUPPLY — 22 items

## 2020-05-01 NOTE — Progress Notes (Signed)
PROGRESS NOTE                                                                                                                                                                                                             Patient Demographics:    [name] [name], is a 69 y.o. female, DOB - December 15, 1951, TKP:[id number]  Outpatient Primary MD for the patient is [facility], [place] date - 04/14/2020   LOS - 6  Chief Complaint  Patient presents with   Altered Mental Status       Brief Narrative: Patient is a 69 y.o. female with PMHx of ESRD, PAD-s/p left great toe amputation on 04/11/2020, CAD s/p CABG, DM-2, HTN-who was transferred from her SNF for evaluation of acute metabolic encephalopathy in the setting of gangrenous changes to the right foot.  She was also incidentally found to have COVID-19.  See below for further details  Significant Events: 4/6-4/22>> admit to [facility] for upper GI bleeding and blood loss anemia, left great toe osteo s/p amputation 4/28>> admit to [facility] for acute metabolic encephalopathy 5/17>> refused to sign consent for angiogram/amputation-rescheduled for 5/3 4/30>> bloody stools  5/2>> colonoscopy postponed-incomplete prep-due to refusal to complete bowel prep 5/3>> colonoscopy postponed-incomplete prep-due to refusal to complete bowel prep, lower extremity angiogram postponed-due to GI bleeding.  COVID-19 medications: Remdesivir: 4/28>> 4/29  Antibiotics: Vancomycin: 4/28>>5/1 Zosyn: 4/28>>  Microbiology data: 4/28: Blood culture>> no growth 4/28: Wound swab/superficial culture>> Pseudomonas  DVT prophylaxis: Hold Heparin 4/30 2/2 GI bleed  Procedures: 4/14>> left great toe amputation 4/10>> EGD  Consults: VVS, renal,GI    Subjective:   No major events overnight-finally able to complete her colonoscopy prep.  I saw her during hemodialysis earlier this morning.   Assessment  & Plan :   Acute metabolic encephalopathy: Likely secondary to SIRS/right foot gangrene-uremia due to missed HD.  Significantly improved after treatment of SIRS/foot infection with gangrene and dialysis-now back to baseline.  SIRS (present on admission) with right lower extremity critical limb ischemia: Afebrile-SIRS physiology has improved-has gangrenous changes evident on exam-please see pictures taken on 4/28 and prior notes.  Blood cultures negative so far.    Superficial swab on admission positive for Pseudomonas-not sure how accurate this is. No longer on vancomycin-continue Zosyn. Refused angiogram/amputation on 4/29-was rescheduled for 5/3-however with her ongoing GI bleeding-angiogram canceled for now.  Patient aware that if GI bleeding is not addressed-we may not be able to do an angiogram/PCI-she may end up with a more proximal amputation-thankfully she was able to complete her colonoscopy prep overnight-she is scheduled for colonoscopy later today.  Very difficult situation-this patient has known history of AVMs and history of recurrent  GI bleeding requiring PRBC transfusion-and may not be able to tolerate long-term dual antiplatelet therapy.  History of PAD with recent left great toe amputation-now with nonhealing left foot wound: Vascular surgery planning debridement when she consents for procedure.  Remains on statin-aspirin held due to possible GI bleeding.  Recurrent upper GI bleeding-history of AVMs and acute blood loss anemia: Required 1 unit of PRBC transfusion on 5/3-hemoglobin currently stable.  See above-scheduled for colonoscopy later today.  Continue to follow CBC.    Hyperkalemia: Secondary to missed HD-resolved with HD.  Follow periodically.  ESRD: Nephrology following and directing care.  Anemia: Multifactorial-likely secondary to kidney disease-worsened by blood loss-being transfused 1 unit of PRBC.  Iron/Aranesp per nephrology.  HTN: BP relatively well  controlled-continue metoprolol  DM-2 with hypoglycemia (A1C 5.1 on 04/03/2020): No further hypoglycemic episodes-continue to hold all insulin for now-she is n.p.o. for colonoscopy.  Not a good candidate for tight glycemic control-allow some amount of permissive hyperglycemia.  Recent Labs    05/02/2020 1650 05/16/2020 2127 05/14/2020 0737  GLUCAP 135* 243* 76   Paroxysmal atrial fibrillation/atrial flutter: Not a new diagnosis-Per discharge summary in October 2020-she also had atrial fibrillation  as well.  Given recurrent GI bleeding/anemia-not a good candidate for anticoagulation.  Continue rate control with metoprolol.  Chronic combined diastolic and systolic heart failure (EF 45-50% by TTE on 04/28/2019): Volume status stable-diuresis with HD.  History of CVA: Continue statin-see above regarding aspirin and anticoagulation  Gout: Not in flare-continue allopurinol  Covid 19 Viral infection: Appears to be asymptomatic--on room air this morning.  Chest x-ray on admission did not show pneumonia.  Fever: afebrile  O2 requirements:  SpO2: 96 % O2 Flow Rate (L/min): 2 L/min   COVID-19 Labs: No results for input(s): DDIMER, FERRITIN, LDH, CRP in the last 72 hours.     Component Value Date/Time   BNP 897.8 (H) 01/01/2020 0600    No results for input(s): PROCALCITON in the last 168 hours.  Lab Results  Component Value Date   SARSCOV2NAA POSITIVE (A) 04/23/2020   [place] NEGATIVE 04/16/2020   [place] NEGATIVE 04/03/2020   [place] NEGATIVE 03/15/2020     Morbid Obesity: Estimated body mass index is 38.63 kg/m as calculated from the following:   Height as of 04/11/20: 5' 7.99" (1.727 m).   Weight as of this encounter: 115.2 kg.     ABG:    Component Value Date/Time   PHART 7.419 02/05/2020 1114   PCO2ART 43.0 02/05/2020 1114   PO2ART 85.1 02/05/2020 1114   HCO3 27.2 02/05/2020 1114   TCO2 22 01/01/2020 0614   ACIDBASEDEF 6.0 (H) 01/01/2020 0614   O2SAT 95.3  02/05/2020 1114    Vent Settings: N/A    Condition - Guarded  Family Communication  : Spoke to aunt on 5/3-we will update family on 5/5 once GI work-up is complete.  Code Status :  Full Code  Diet :  Diet Order  Diet NPO time specified  Diet effective midnight               Disposition Plan  :  Status is: Inpatient  Remains inpatient appropriate because:Inpatient level of care appropriate due to severity of illness  Dispo: The patient is from: SNF              Anticipated d/c is to: SNF              Anticipated d/c date is: > 3 days              Patient currently is not medically stable to d/c.   Barriers to discharge: Right foot gangrene-requiring amputation-remains on IV antibiotics  Antimicorbials  :    Anti-infectives (From admission, onward)   Start     Dose/Rate Route Frequency Ordered Stop   04/28/20 1200  vancomycin (VANCOCIN) IVPB 1000 mg/200 mL premix  Status:  Discontinued     1,000 mg 200 mL/hr over 60 Minutes Intravenous Every T-Th-Sa (Hemodialysis) 04/27/20 1154 04/28/20 1218   04/26/20 1400  piperacillin-tazobactam (ZOSYN) IVPB 2.25 g     2.25 g 100 mL/hr over 30 Minutes Intravenous Every 8 hours 04/26/20 1314     04/26/20 1000  remdesivir 100 mg in sodium chloride 0.9 % 100 mL IVPB  Status:  Discontinued     100 mg 200 mL/hr over 30 Minutes Intravenous Daily 04/01/2020 1952 04/26/20 1105   03/30/2020 2030  remdesivir 200 mg in sodium chloride 0.9% 250 mL IVPB     200 mg 580 mL/hr over 30 Minutes Intravenous Once 04/16/2020 1952 04/23/2020 2327   04/23/2020 1515  vancomycin (VANCOCIN) 2,500 mg in sodium chloride 0.9 % 500 mL IVPB     2,500 mg 250 mL/hr over 120 Minutes Intravenous  Once 04/11/2020 1513 03/30/2020 2026   03/29/2020 1513  vancomycin variable dose per unstable renal function (pharmacist dosing)  Status:  Discontinued      Does not apply See admin instructions 04/17/2020 1514 04/28/20 1218   03/30/2020 1500  piperacillin-tazobactam (ZOSYN)  IVPB 3.375 g     3.375 g 100 mL/hr over 30 Minutes Intravenous  Once 03/29/2020 1450 04/10/2020 2026      Inpatient Medications  Scheduled Meds:  allopurinol  100 mg Oral Daily   atorvastatin  40 mg Oral q1800   buPROPion  150 mg Oral BID   calcitRIOL  2.25 mcg Oral Q T,Th,Sat-1800   Chlorhexidine Gluconate Cloth  6 each Topical Q0600   Chlorhexidine Gluconate Cloth  6 each Topical Q0600   cinacalcet  30 mg Oral Q T,Th,Sat-1800   darbepoetin (ARANESP) injection - DIALYSIS  150 mcg Intravenous Q Sat-HD   gabapentin  300 mg Oral QHS   heparin sodium (porcine)       linaclotide  72 mcg Oral QAC breakfast   loratadine  10 mg Oral Daily   metoprolol tartrate  25 mg Oral BID   pantoprazole  40 mg Oral Daily   peg 3350 powder  0.5 kit Oral Once   polyethylene glycol  17 g Oral BID   sodium chloride flush  3 mL Intravenous Q12H   [START ON 05/02/2020] sucroferric oxyhydroxide  1,000 mg Oral TID WC   Continuous Infusions:  sodium chloride     sodium chloride     sodium chloride     piperacillin-tazobactam (ZOSYN)  IV Stopped (05/13/2020 0649)   PRN Meds:.sodium chloride, sodium chloride, sodium chloride, acetaminophen, albuterol, alteplase, heparin, lidocaine (PF), lidocaine-prilocaine, metoprolol tartrate,  mometasone-formoterol, nitroGLYCERIN, oxyCODONE, pentafluoroprop-tetrafluoroeth, sodium chloride flush   Time Spent in minutes 25   See all Orders from today for further details   Oren Binet M.D on 05/17/2020 at 11:51 AM  To page go to [email or web address] - use universal password  [facility] -  Office  [phone]    Objective:   Vitals:   05/23/2020 1000 05/28/2020 1030 05/05/2020 1100 05/22/2020 1130  BP: (!) 149/66 107/69 109/85 (!) 131/54  Pulse: 71 69 77 73  Resp:      Temp:      TempSrc:      SpO2:      Weight:        Wt Readings from Last 3 Encounters:  05/23/2020 115.2 kg  04/19/20 118.4 kg  03/02/20 120.2 kg     Intake/Output Summary  (Last 24 hours) at 05/12/2020 1151 Last data filed at 05/19/2020 0323 Gross per 24 hour  Intake 2106 ml  Output --  Net 2106 ml     Physical Exam Gen Exam:Alert awake-not in any distress HEENT:atraumatic, normocephalic Chest: B/L clear to auscultation anteriorly CVS:S1S2 regular Abdomen:soft non tender, non distended Extremities:trace edema Neurology: Non focal Skin: no rash   Data Review:    CBC Recent Labs  Lab 04/12/2020 1315 04/26/20 1630 04/27/20 1343 04/28/20 0719 04/29/20 0632 05/11/2020 0500 05/13/2020 0501  WBC 7.9   < > 7.8 7.6 7.2 7.0 7.5  HGB 8.8*   < > 8.6* 7.7* 8.1* 7.1* 8.0*  HCT 29.0*   < > 28.3* 25.0* 26.7* 23.3* 25.6*  PLT 416*   < > 322 313 311 290 301  MCV 86.1   < > 86.5 84.5 84.5 85.0 87.7  MCH 26.1   < > 26.3 26.0 25.6* 25.9* 27.4  MCHC 30.3   < > 30.4 30.8 30.3 30.5 31.3  RDW 18.9*   < > 18.9* 19.0* 18.6* 18.9* 18.7*  LYMPHSABS 1.3  --   --  1.3  --   --   --   MONOABS 0.7  --   --  0.6  --   --   --   EOSABS 0.0  --   --  0.1  --   --   --   BASOSABS 0.1  --   --  0.0  --   --   --    < > = values in this interval not displayed.    Chemistries  Recent Labs  Lab 04/12/2020 1315 04/26/20 0500 04/27/20 0445 04/28/20 0719 04/29/20 0632 04/29/2020 0500 05/25/2020 0501  NA 138   < > 139 142 140 140 144  K 6.6*   < > 4.5 4.5 3.9 3.5 3.6  CL 97*   < > 100 101 100 100 101  CO2 22   < > _0 GLUCOSE 89   < > 69* 75 97 106* 99  BUN 81*   < > 49* 57* 27* 30* 35*  CREATININE 11.32*   < > 8.44* 9.73* 5.84* 7.23* 8.65*  CALCIUM 8.2*   < > 7.3* 7.4* 7.6* 7.2* 7.3*  AST 21  --   --   --   --   --   --   ALT 7  --   --   --   --   --   --   ALKPHOS 102  --   --   --   --   --   --   BILITOT 0.7  --   --   --   --   --   --    < > =  values in this interval not displayed.   ------------------------------------------------------------------------------------------------------------------ No results for input(s): CHOL, HDL, LDLCALC, TRIG, CHOLHDL,  LDLDIRECT in the last 72 hours.  Lab Results  Component Value Date   HGBA1C 5.1 04/03/2020   ------------------------------------------------------------------------------------------------------------------ No results for input(s): TSH, T4TOTAL, T3FREE, THYROIDAB in the last 72 hours.  Invalid input(s): FREET3 ------------------------------------------------------------------------------------------------------------------ No results for input(s): VITAMINB12, FOLATE, FERRITIN, TIBC, IRON, RETICCTPCT in the last 72 hours.  Coagulation profile Recent Labs  Lab 04/23/2020 1315  INR 1.3*    No results for input(s): DDIMER in the last 72 hours.  Cardiac Enzymes No results for input(s): CKMB, TROPONINI, MYOGLOBIN in the last 168 hours.  Invalid input(s): CK ------------------------------------------------------------------------------------------------------------------    Component Value Date/Time   BNP 897.8 (H) 01/01/2020 0600    Micro Results Recent Results (from the past 240 hour(s))  Culture, blood (Routine x 2)     Status: None   Collection Time: 04/08/2020  1:00 PM   Specimen: BLOOD  Result Value Ref Range Status   Specimen Description BLOOD RIGHT ANTECUBITAL  Final   Special Requests   Final    BOTTLES DRAWN AEROBIC AND ANAEROBIC Blood Culture results may not be optimal due to an inadequate volume of blood received in culture bottles   Culture   Final    NO GROWTH 5 DAYS Performed at [facility], [place] [address], [place], [place] [address]    Report Status 05/17/2020 FINAL  Final  Culture, blood (Routine x 2)     Status: None   Collection Time: 04/10/2020  1:20 PM   Specimen: BLOOD RIGHT HAND  Result Value Ref Range Status   Specimen Description BLOOD RIGHT HAND  Final   Special Requests   Final    BOTTLES DRAWN AEROBIC ONLY Blood Culture results may not be optimal due to an inadequate volume of blood received in culture bottles   Culture   Final    NO GROWTH  5 DAYS Performed at [facility], [place] [address], [place], [place] [address]    Report Status 05/19/2020 FINAL  Final  Respiratory Panel by RT PCR (Flu A&B, Covid) - Nasopharyngeal Swab     Status: Abnormal   Collection Time: 03/29/2020  3:58 PM   Specimen: Nasopharyngeal Swab  Result Value Ref Range Status   SARS Coronavirus 2 by RT PCR POSITIVE (A) NEGATIVE Final    Comment: RESULT CALLED TO, READ BACK BY AND VERIFIED WITH: [facility] RN 04/05/2020 1751 JDW (NOTE) SARS-CoV-2 target nucleic acids are DETECTED. SARS-CoV-2 RNA is generally detectable in upper respiratory specimens  during the acute phase of infection. Positive results are indicative of the presence of the identified virus, but do not rule out bacterial infection or co-infection with other pathogens not detected by the test. Clinical correlation with patient history and other diagnostic information is necessary to determine patient infection status. The expected result is Negative. Fact Sheet for Patients:  PinkCheek.be Fact Sheet for Healthcare Providers: GravelBags.it This test is not yet approved or cleared by the [place] FDA and  has been authorized for detection and/or diagnosis of SARS-CoV-2 by FDA under an Emergency Use Authorization (EUA).  This EUA will remain in effect (meaning this test can be used) for th e duration of  the COVID-19 declaration under Section 564(b)(1) of the Act, 21 U.S.C. section 360bbb-3(b)(1), unless the authorization is terminated or revoked sooner.    Influenza A by PCR NEGATIVE NEGATIVE Final   Influenza B by PCR NEGATIVE  NEGATIVE Final    Comment: (NOTE) The Xpert Xpress SARS-CoV-2/FLU/RSV assay is intended as an aid in  the diagnosis of influenza from Nasopharyngeal swab specimens and  should not be used as a sole basis for treatment. Nasal washings and  aspirates are unacceptable for Xpert Xpress  SARS-CoV-2/FLU/RSV  testing. Fact Sheet for Patients: PinkCheek.be Fact Sheet for Healthcare Providers: GravelBags.it This test is not yet approved or cleared by the [place] FDA and  has been authorized for detection and/or diagnosis of SARS-CoV-2 by  FDA under an Emergency Use Authorization (EUA). This EUA will remain  in effect (meaning this test can be used) for the duration of the  Covid-19 declaration under Section 564(b)(1) of the Act, 21  U.S.C. section 360bbb-3(b)(1), unless the authorization is  terminated or revoked. Performed at [facility], [place] [address] [place], [place] [address]   Wound or Superficial Culture     Status: None   Collection Time: 04/13/2020  8:43 PM   Specimen: Toe; Wound  Result Value Ref Range Status   Specimen Description TOE  Final   Special Requests RIGHT  Final   Gram Stain   Final    NO WBC SEEN NO ORGANISMS SEEN Performed at [facility], [address], [place], [place] [address]    Culture RARE PSEUDOMONAS AERUGINOSA  Final   Report Status 04/28/2020 FINAL  Final   Organism ID, Bacteria PSEUDOMONAS AERUGINOSA  Final      Susceptibility   Pseudomonas aeruginosa - MIC*    CEFTAZIDIME 4 SENSITIVE Sensitive     CIPROFLOXACIN <=0.25 SENSITIVE Sensitive     GENTAMICIN 4 SENSITIVE Sensitive     IMIPENEM 2 SENSITIVE Sensitive     PIP/TAZO 8 SENSITIVE Sensitive     CEFEPIME 4 SENSITIVE Sensitive     * RARE PSEUDOMONAS AERUGINOSA    Radiology Reports DG Chest 2 View  Result Date: 03/30/2020 CLINICAL DATA:  69 year old female with suspected sepsis. Bilateral foot infection for the past 3 weeks. EXAM: CHEST - 2 VIEW COMPARISON:  Chest x-ray 04/04/2020. FINDINGS: Lung volumes are normal. No consolidative airspace disease. No pleural effusions. No evidence of frank pulmonary edema. Mild cephalization of the pulmonary vasculature. Mild cardiomegaly. Upper mediastinal  contours are within normal limits. Aortic atherosclerosis. Status post median sternotomy for CABG. Right internal jugular PermCath with tip terminating in the mid to distal superior vena cava. Aortic atherosclerosis. IMPRESSION: 1. Postoperative changes and support apparatus, as above. 2. Mild cardiomegaly with pulmonary venous congestion, without frank pulmonary edema. 3. Aortic atherosclerosis. Electronically Signed   By: Vinnie Langton M.D.   On: 04/21/2020 14:52   CT FOOT RIGHT WO CONTRAST  Result Date: 04/12/2020 CLINICAL DATA:  Pain and swelling. EXAM: CT OF THE RIGHT FOOT WITHOUT CONTRAST TECHNIQUE: Multidetector CT imaging of the right foot was performed according to the standard protocol. Multiplanar CT image reconstructions were also generated. COMPARISON:  Radiographs 04/09/2020 FINDINGS: Diffuse subcutaneous soft tissue swelling/edema/fluid involving the entire ankle and foot. No focal fluid collection to suggest a drainable soft tissue abscess. Marked fatty atrophy of the foot and ankle musculature. Suspect myofasciitis involving the plantar foot musculature but no definite findings for pyomyositis. Suspect an open wound along the plantar aspect of the great toe but I do not see any obvious destructive bony changes to suggest osteomyelitis. No definite findings for septic arthritis. The other bony structures of the foot and ankle appear intact. No obvious destructive bony changes, fractures or osteochondral lesions. Extensive  small artery calcifications. IMPRESSION: 1. Diffuse subcutaneous soft tissue swelling/edema/fluid involving the entire ankle and foot consistent with cellulitis. No focal fluid collection to suggest a drainable soft tissue abscess. 2. Suspect myofasciitis involving the plantar foot musculature but no definite findings for pyomyositis. Severe diffuse fatty atrophy of the foot and ankle musculature. 3. Suspect an open wound along the plantar aspect of the great toe but I do not  see any obvious destructive bony changes to suggest osteomyelitis. No definite findings for septic arthritis. 4. If symptoms persist or worsen MRI is suggested for further evaluation. Electronically Signed   By: Marijo Sanes M.D.   On: 04/12/2020 10:51   DG CHEST PORT 1 VIEW  Result Date: 04/04/2020 CLINICAL DATA:  69 year old female with fever. EXAM: PORTABLE CHEST 1 VIEW COMPARISON:  Chest radiograph dated 02/24/2020. FINDINGS: Right-sided dialysis catheter in similar position. There is cardiomegaly with mild vascular congestion. No focal consolidation, pleural effusion or pneumothorax. Median sternotomy wires and CABG vascular clips. No acute osseous pathology. IMPRESSION: Cardiomegaly with mild vascular congestion. No focal consolidation. Electronically Signed   By: Anner Crete M.D.   On: 04/04/2020 17:29   DG ABD ACUTE 2+V W 1V CHEST  Result Date: 04/09/2020 CLINICAL DATA:  Fever with low hemoglobin. EXAM: DG ABDOMEN ACUTE W/ 1V CHEST COMPARISON:  Abdominal film 06/26/2018 and chest x-ray 02/24/2020 FINDINGS: Right IJ central venous catheter unchanged with tip over the SVC. Lungs are adequately inflated with minimal hazy prominence of the perihilar vessels unchanged and likely due to mild chronic vascular congestion. No lobar consolidation or effusion. Mild stable cardiomegaly. Remainder the chest is unchanged. There are several air-filled large and small bowel loops. A few air-filled small bowel loops at the upper limits of normal in diameter over the left abdomen. No evidence of air-fluid levels or free peritoneal air. Remainder of the exam is unchanged. IMPRESSION: Nonspecific, nonobstructive bowel gas pattern with a few air-filled mildly prominent, but nondilated small bowel loops in the left abdomen. Stable mild cardiomegaly with suggestion of minimal chronic vascular congestion. Electronically Signed   By: Marin Olp M.D.   On: 04/09/2020 09:57   DG Foot Complete Left  Result Date:  04/23/2020 CLINICAL DATA:  Recent amputation. Suspect sepsis. Bilateral foot infection 3 weeks. EXAM: LEFT FOOT - COMPLETE 3+ VIEW COMPARISON:  04/09/2020 FINDINGS: There is generalized decreased bone mineralization. Evidence of patient's recent amputation distal to the distal shaft of the first metatarsal. Expected postsurgical changes at the amputation site. No focal bone destruction to suggest osteomyelitis. No air within the soft tissues. Exam is otherwise unchanged. IMPRESSION: Expected changes post amputation distal to the distal aspect of the first metatarsal. No definite bone destruction to suggest osteomyelitis. No air within the soft tissues. Electronically Signed   By: Marin Olp M.D.   On: 04/16/2020 14:58   DG Foot Complete Left  Result Date: 04/09/2020 CLINICAL DATA:  Foot pain and swelling EXAM: LEFT FOOT - COMPLETE 3+ VIEW COMPARISON:  02/01/2020 MRI, 01/31/2020 plain film FINDINGS: There is been interval amputation of the first toe. Persistent soft tissue wound is seen. There is lucency identified in the head of the first metatarsal suspicious for recurrent osteomyelitis. No other fracture or dislocation is seen. No other soft tissue abnormality is noted. IMPRESSION: Changes suspicious for osteomyelitis in the head of the first metatarsal. Overlying soft tissue wound is noted. Electronically Signed   By: Inez Catalina M.D.   On: 04/09/2020 19:40   DG Foot Complete Right  Result Date: 04/10/2020 CLINICAL DATA:  Recent amputation with possible infection three weeks. Suspect sepsis. EXAM: RIGHT FOOT COMPLETE - 3+ VIEW COMPARISON:  CT 04/12/2020 FINDINGS: Diffuse decreased bone mineralization. Degenerative change over the first MTP joint and interphalangeal joints. Mild horizontal sclerosis with subtle lucency involving the head of the fifth metatarsal possibly subacute fracture. No focal bone destruction to suggest osteomyelitis. No air within the soft tissues. IMPRESSION: 1. No definite  evidence of osteomyelitis and no air within the soft tissues. 2. Linear sclerosis with subtle lucency involving the head of the fifth metatarsal which may be due to nondisplaced subacute fracture. Electronically Signed   By: Marin Olp M.D.   On: 04/05/2020 14:56   VAS [place] LOWER EXTREMITY VENOUS (DVT)  Result Date: 04/10/2020  Lower Venous DVTStudy Indications: Edema, and fever.  Limitations: Body habitus and poor ultrasound/tissue interface. Comparison Study: 04/28/19 previous Performing Technologist: Abram Sander RVS  Examination Guidelines: A complete evaluation includes B-mode imaging, spectral Doppler, color Doppler, and power Doppler as needed of all accessible portions of each vessel. Bilateral testing is considered an integral part of a complete examination. Limited examinations for reoccurring indications may be performed as noted. The reflux portion of the exam is performed with the patient in reverse Trendelenburg.  +---------+---------------+---------+-----------+----------+--------------+  RIGHT     Compressibility Phasicity Spontaneity Properties Thrombus Aging  +---------+---------------+---------+-----------+----------+--------------+  CFV       Full            Yes       Yes                                    +---------+---------------+---------+-----------+----------+--------------+  SFJ       Full                                                             +---------+---------------+---------+-----------+----------+--------------+  FV Prox   Full                                                             +---------+---------------+---------+-----------+----------+--------------+  FV Mid    Full                                                             +---------+---------------+---------+-----------+----------+--------------+  FV Distal Full                                                             +---------+---------------+---------+-----------+----------+--------------+  PFV       Full                                                              +---------+---------------+---------+-----------+----------+--------------+  POP       Full            Yes       Yes                                    +---------+---------------+---------+-----------+----------+--------------+  PTV       Full                                                             +---------+---------------+---------+-----------+----------+--------------+  PERO                                                       Not visualized  +---------+---------------+---------+-----------+----------+--------------+   +---------+---------------+---------+-----------+----------+--------------+  LEFT      Compressibility Phasicity Spontaneity Properties Thrombus Aging  +---------+---------------+---------+-----------+----------+--------------+  CFV       Full            Yes       Yes                                    +---------+---------------+---------+-----------+----------+--------------+  SFJ       Full                                                             +---------+---------------+---------+-----------+----------+--------------+  FV Prox   Full                                                             +---------+---------------+---------+-----------+----------+--------------+  FV Mid    Full                                                             +---------+---------------+---------+-----------+----------+--------------+  FV Distal Full                                                             +---------+---------------+---------+-----------+----------+--------------+  PFV       Full                                                             +---------+---------------+---------+-----------+----------+--------------+  POP       Full            Yes       Yes                                    +---------+---------------+---------+-----------+----------+--------------+  PTV       Full                                                              +---------+---------------+---------+-----------+----------+--------------+  PERO                                                       Not visualized  +---------+---------------+---------+-----------+----------+--------------+     Summary: BILATERAL: - No evidence of deep vein thrombosis seen in the lower extremities, bilaterally.   *See table(s) above for measurements and observations. Electronically signed by Deitra Mayo MD on 04/10/2020 at 3:24:49 PM.    Final

## 2020-05-01 NOTE — Plan of Care (Signed)

## 2020-05-01 NOTE — Op Note (Signed)
[facility] Patient Name: [name] [name] Procedure Date : 05/22/2020 MRN: [id number] Attending MD: Thornton Park MD, MD Date of Birth: 27-Nov-1951 CSN: [id number] Age: 69 Admit Type: Inpatient Procedure:                Colonoscopy Indications:              Hematochezia, not explained on recent push                            enteroscopy Providers:                Thornton Park MD, MD, Cleda Daub, RN ,                            Juanda Chance CRNA, Laverda Sorenson, Technician Referring MD:              Medicines:                Monitored Anesthesia Care Complications:            No immediate complications. Estimated Blood Loss:     Estimated blood loss: none. Procedure:                Pre-Anesthesia Assessment:                           - Prior to the procedure, a History and Physical                            was performed, and patient medications and                            allergies were reviewed. The patient's tolerance of                            previous anesthesia was also reviewed. The risks                            and benefits of the procedure and the sedation                            options and risks were discussed with the patient.                            All questions were answered, and informed consent                            was obtained. Prior Anticoagulants: The patient has                            taken heparin, last dose was 5 days prior to                            procedure. ASA Grade Assessment: III - A patient  with severe systemic disease. After reviewing the                            risks and benefits, the patient was deemed in                            satisfactory condition to undergo the procedure.                           After obtaining informed consent, the colonoscope                            was passed under direct vision. Throughout the                            procedure, the  patient's blood pressure, pulse, and                            oxygen saturations were monitored continuously. The                            CF-HQ190L ([id number] Olympus colonoscope was                            introduced through the anus and advanced to the 20                            cm into the ileum. The colonoscopy was performed                            with moderate difficulty due to poor endoscopic                            visualization. The patient tolerated the procedure                            well. The quality of the bowel preparation was                            poor. The terminal ileum, ileocecal valve,                            appendiceal orifice, and rectum were photographed. Scope In: 2:22:50 PM Scope Out: 2:38:04 PM Scope Withdrawal Time: 0 hours 9 minutes 22 seconds  Total Procedure Duration: 0 hours 15 minutes 14 seconds  Findings:      The perianal and digital rectal examinations were normal.      Non-bleeding internal hemorrhoids were found.      Two sessile polyps were found in the hepatic flexure. The polyps were 2       mm in size. The polyps were not removed due to the ongoing bleeding of       unclear etiology.      An endoclip was seen at the hepatic flexure.      A 2 mm polyp  was found in the cecum. The polyp was sessile. The polyps       were not removed due to the ongoing bleeding of unclear etiology.      Hematin (altered blood/coffee-ground-like material) was found in the       entire colon limiting a complete evaluation.      Approximately 20 cm of terminal ileum was exmained. The entire examined       ileum contained hematin (altered blood/coffee-ground-like material).      The exam was otherwise without abnormality on direct and retroflexion       views. Impression:               - Preparation of the colon was poor due to residual                            blood.                           - Non-bleeding internal hemorrhoids.                            - Three small polyps - not removed in the setting                            of unexplained GI bleeding.                           - Blood in the entire examined colon and examined                            distal terminal ileum.                           - Source of bleeding not identified on this study. Recommendation:           - Return patient to hospital ward for ongoing care.                           - NPO.                           - Continue to hold heparin.                           - Continue present medications.                           - CTA to evaluate for ongoing GI blood loss. If                            positive, proceed with angiogram.                           - Outpatient colonoscopy when her acute illness has                            resolved for removal of the colon polyps. Procedure Code(s):        ---  Professional ---                           [phone], Colonoscopy, flexible; with removal of                            tumor(s), polyp(s), or other lesion(s) by snare                            technique Diagnosis Code(s):        --- Professional ---                           K64.8, Other hemorrhoids                           K63.5, Polyp of colon                           T18.4XXA, Foreign body in colon, initial encounter                           K92.2, Gastrointestinal hemorrhage, unspecified                           K92.1, Melena (includes Hematochezia) CPT copyright 2019 [employer]. All rights reserved. The codes documented in this report are preliminary and upon coder review may  be revised to meet current compliance requirements. Thornton Park MD, MD 05/02/2020 3:03:45 PM This report has been signed electronically. Number of Addenda: 0

## 2020-05-01 NOTE — Progress Notes (Signed)
[facility] Progress Note  Subjective:  Patient not examined today directly given COVID-19 + status, utilizing data taken from chart +/- discussions w/ providers and staff.   SP 1u prbc today. Colonoscopy today showed blood throughout the entire colon and term ileum, source not ID'd. Next plan is for CTA of abdomen I believe.   Vitals:   05/24/2020 1446 04/29/2020 1448 05/27/2020 1450 05/26/2020 1455  BP: (!) 121/100 (!) 107/47  109/88  Pulse: 69  66 65  Resp:      Temp: (!) 97.5 F (36.4 C)     TempSrc: Oral     SpO2: 99%  95% 95%  Weight:      Height:        Exam:  Patient not examined today directly given COVID-19 + status, utilizing data taken from chart +/- discussions w/ providers and staff.    Dialysis: [facility]    4h 76mn  400/800  118kg  2/2.5 bath  Hep none  TDC  Venofer 100 q HD until 4/17  Mircera 225 (last 4/1)  Sensipar 30 Calcitriol 2.25    Assessment/ Plan: 1. Osteomyelitis left foot/ ischemic changes right leg: Seen by vascular surgery with plans for angiography to help revascularize right lower extremity and staged subsequent amputation of toes with debridement of left foot. 2. Rectal bleeding: Prior history of arteriovenous malformations and seen by gastroenterology with colonoscopy done today w/ blood throughout the colon, unclear source. Possibly will do CTA abdomen as next test.   3. ESRD: HD TTS.  HD today.  4. Anemia: Acute blood loss anemia superimposed on anemia of ESRD/chronic kidney disease.  Hemodynamically stable and received ESA with dialysis 5/1.  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 6. Nutrition: Continue renal diet with fluid restriction and nutritional supplementation to improve wound healing. 7. Hypertension: Blood pressure within acceptable range.  With evidence of chronic volume overload but will continue to follow with dialysis.     Rob SDoctor, hospital05/19/2021, 3:48  PM   Recent Labs  Lab 05/22/2020 0500 05/19/2020 0501  K 3.5 3.6  BUN 30* 35*  CREATININE 7.23* 8.65*  CALCIUM 7.2* 7.3*  PHOS 4.6 5.4*  HGB 7.1* 8.0*   Inpatient medications: . allopurinol  100 mg Oral Daily  . atorvastatin  40 mg Oral q1800  . buPROPion  150 mg Oral BID  . calcitRIOL  2.25 mcg Oral Q T,Th,Sat-1800  . Chlorhexidine Gluconate Cloth  6 each Topical Q0600  . Chlorhexidine Gluconate Cloth  6 each Topical Q0600  . cinacalcet  30 mg Oral Q T,Th,Sat-1800  . darbepoetin (ARANESP) injection - DIALYSIS  150 mcg Intravenous Q Sat-HD  . gabapentin  300 mg Oral QHS  . heparin sodium (porcine)      . linaclotide  72 mcg Oral QAC breakfast  . loratadine  10 mg Oral Daily  . metoprolol tartrate  25 mg Oral BID  . pantoprazole  40 mg Oral Daily  . peg 3350 powder  0.5 kit Oral Once  . polyethylene glycol  17 g Oral BID  . sodium chloride flush  3 mL Intravenous Q12H  . [START ON 05/02/2020] sucroferric oxyhydroxide  1,000 mg Oral TID WC   . sodium chloride    . sodium chloride    . sodium chloride    . piperacillin-tazobactam (ZOSYN)  IV Stopped (05/01/20 0649)   sodium chloride, sodium chloride, sodium chloride, acetaminophen, albuterol, alteplase, heparin, lidocaine (PF), lidocaine-prilocaine, metoprolol  tartrate, mometasone-formoterol, nitroGLYCERIN, oxyCODONE, pentafluoroprop-tetrafluoroeth, sodium chloride flush

## 2020-05-01 NOTE — Transfer of Care (Signed)
Immediate Anesthesia Transfer of Care Note  Patient: [name] [name]  Procedure(s) Performed: COLONOSCOPY WITH PROPOFOL (N/A )  Patient Location: Endoscopy Unit  Anesthesia Type:MAC  Level of Consciousness: drowsy, patient cooperative  Airway & Oxygen Therapy: Patient Spontanous Breathing and Patient connected to face mask oxygen  Post-op Assessment: Report given to RN and Post -op Vital signs reviewed and stable  Post vital signs: Reviewed and stable  Last Vitals:  Vitals Value Taken Time  BP    Temp    Pulse    Resp    SpO2      Last Pain:  Vitals:   05/25/2020 1403  TempSrc: Oral  PainSc: 0-No pain      Patients Stated Pain Goal: 0 (54/56/25 6389)  Complications: No apparent anesthesia complications

## 2020-05-01 NOTE — Anesthesia Preprocedure Evaluation (Addendum)
Anesthesia Evaluation  Patient identified by MRN, date of birth, ID band Patient awake    Reviewed: Allergy & Precautions, NPO status , Patient's Chart, lab work & pertinent test results, reviewed documented beta blocker date and time   History of Anesthesia Complications Negative for: history of anesthetic complications  Airway Mallampati: I  TM Distance: >3 FB Neck ROM: Full    Dental  (+) Edentulous Upper, Edentulous Lower   Pulmonary shortness of breath, pneumonia, COPD, Current Smoker and Patient abstained from smoking.,  04/03/2020 SARS coronavirus NEG   breath sounds clear to auscultation       Cardiovascular hypertension, Pt. on medications and Pt. on home beta blockers (-) angina+ CAD, + Past MI, + CABG, + Peripheral Vascular Disease and +CHF   Rhythm:Regular Rate:Normal  '20 ECHO: EF 45-50%, mild MR   Neuro/Psych PSYCHIATRIC DISORDERS Depression CVA (L sided numbness), Residual Symptoms    GI/Hepatic hiatal hernia, GERD  Controlled,(+)     substance abuse  cocaine use,   Endo/Other  diabetes, Insulin DependentHypothyroidism Morbid obesity  Renal/GU ESRF and DialysisRenal disease (K+ 4.0)     Musculoskeletal  (+) Arthritis ,   Abdominal (+) + obese,   Peds  Hematology  (+) Blood dyscrasia (Hb 8.1), anemia ,   Anesthesia Other Findings   Reproductive/Obstetrics                             Anesthesia Physical  Anesthesia Plan  ASA: III  Anesthesia Plan: MAC   Post-op Pain Management:    Induction: Intravenous  PONV Risk Score and Plan: 2 and Propofol infusion, TIVA and Treatment may vary due to age or medical condition  Airway Management Planned: Natural Airway  Additional Equipment:   Intra-op Plan:   Post-operative Plan:   Informed Consent: I have reviewed the patients History and Physical, chart, labs and discussed the procedure including the risks, benefits and  alternatives for the proposed anesthesia with the patient or authorized representative who has indicated his/her understanding and acceptance.     Dental advisory given  Plan Discussed with: CRNA  Anesthesia Plan Comments:        Anesthesia Quick Evaluation

## 2020-05-01 NOTE — Anesthesia Postprocedure Evaluation (Signed)
Anesthesia Post Note  Patient: [name] [name]  Procedure(s) Performed: COLONOSCOPY WITH PROPOFOL (N/A )     Patient location during evaluation: Endoscopy Anesthesia Type: MAC Level of consciousness: awake and alert Pain management: pain level controlled Vital Signs Assessment: post-procedure vital signs reviewed and stable Respiratory status: spontaneous breathing Cardiovascular status: stable Anesthetic complications: no    Last Vitals:  Vitals:   05/14/2020 1450 05/12/2020 1455  BP:  109/88  Pulse: 66 65  Resp:    Temp:    SpO2: 95% 95%    Last Pain:  Vitals:   05/24/2020 1455  TempSrc:   PainSc: 0-No pain                 Nolon Nations

## 2020-05-01 NOTE — Anesthesia Procedure Notes (Signed)
Procedure Name: MAC Date/Time: 05/26/2020 2:18 PM Performed by: Colin Benton, CRNA Pre-anesthesia Checklist: Patient identified, Suction available, Emergency Drugs available and Patient being monitored Patient Re-evaluated:Patient Re-evaluated prior to induction Oxygen Delivery Method: Simple face mask Induction Type: IV induction Placement Confirmation: positive ETCO2 Dental Injury: Teeth and Oropharynx as per pre-operative assessment

## 2020-05-02 LAB — RENAL FUNCTION PANEL
Albumin: 2.2 g/dL — ABNORMAL LOW (ref 3.5–5.0)
Anion gap: 14 (ref 5–15)
BUN: 15 mg/dL (ref 8–23)
CO2: 25 mmol/L (ref 22–32)
Calcium: 7.7 mg/dL — ABNORMAL LOW (ref 8.9–10.3)
Chloride: 99 mmol/L (ref 98–111)
Creatinine, Ser: 5.08 mg/dL — ABNORMAL HIGH (ref 0.44–1.00)
GFR calc Af Amer: 9 mL/min — ABNORMAL LOW (ref >60)
GFR calc non Af Amer: 8 mL/min — ABNORMAL LOW (ref >60)
Glucose, Bld: 74 mg/dL (ref 70–99)
Phosphorus: 3.1 mg/dL (ref 2.5–4.6)
Potassium: 4.3 mmol/L (ref 3.5–5.1)
Sodium: 138 mmol/L (ref 135–145)

## 2020-05-02 LAB — GLUCOSE, CAPILLARY
Glucose-Capillary: 82 mg/dL (ref 70–99)
Glucose-Capillary: 73 mg/dL (ref 70–99)
Glucose-Capillary: 180 mg/dL — ABNORMAL HIGH (ref 70–99)
Glucose-Capillary: 137 mg/dL — ABNORMAL HIGH (ref 70–99)
Glucose-Capillary: 124 mg/dL — ABNORMAL HIGH (ref 70–99)

## 2020-05-02 LAB — CBC
HCT: 26.7 % — ABNORMAL LOW (ref 36.0–46.0)
Hemoglobin: 8.3 g/dL — ABNORMAL LOW (ref 12.0–15.0)
MCH: 27.3 pg (ref 26.0–34.0)
MCHC: 31.1 g/dL (ref 30.0–36.0)
MCV: 87.8 fL (ref 80.0–100.0)
Platelets: 343 10*3/uL (ref 150–400)
RBC: 3.04 MIL/uL — ABNORMAL LOW (ref 3.87–5.11)
RDW: 18.7 % — ABNORMAL HIGH (ref 11.5–15.5)
WBC: 9.3 10*3/uL (ref 4.0–10.5)
nRBC: 1.5 % — ABNORMAL HIGH (ref 0.0–0.2)

## 2020-05-02 MED ORDER — METOCLOPRAMIDE HCL 5 MG/ML IJ SOLN
10.0000 mg | Freq: Once | INTRAMUSCULAR | Status: AC
  Administered 2020-05-03: 10 mg via INTRAVENOUS
  Filled 2020-05-02: qty 2

## 2020-05-02 MED ORDER — BOOST / RESOURCE BREEZE PO LIQD CUSTOM
1.0000 | Freq: Three times a day (TID) | ORAL | Status: DC
  Administered 2020-05-02 – 2020-05-09 (×15): 1 via ORAL

## 2020-05-02 MED ORDER — CHLORHEXIDINE GLUCONATE CLOTH 2 % EX PADS
6.0000 | MEDICATED_PAD | Freq: Every day | CUTANEOUS | Status: DC
  Administered 2020-05-03 – 2020-05-09 (×7): 6 via TOPICAL

## 2020-05-02 NOTE — Plan of Care (Signed)

## 2020-05-02 NOTE — Progress Notes (Signed)
[facility] Progress Note  Subjective:  Patient not examined today directly given COVID-19 + status, utilizing data taken from chart +/- discussions w/ providers and staff.   Procedures: Colonoscopy 5/4 showed blood throughout the entire colon and term ileum, source not ID'd. CTA of abdomen did not reveal source of bleeding. GI following, capsule endoscopy next.  Vitals:   05/02/20 0400 05/02/20 0402 05/02/20 0832 05/02/20 0949  BP: 110/62  (!) 115/44 94/81  Pulse:   72 61  Resp: 15  16   Temp:  97.8 F (36.6 C) 98.3 F (36.8 C)   TempSrc:   Oral   SpO2: 100%  98%   Weight:      Height:        Exam:  Patient not examined today directly given COVID-19 + status, utilizing data taken from chart +/- discussions w/ providers and staff.    Dialysis: [facility]    4h 65mn  400/800  118kg  2/2.5 bath  Hep none  TDC  Venofer 100 q HD until 4/17  Mircera 225 (last 4/1)  Sensipar 30 Calcitriol 2.25    Assessment/ Plan: 1. Osteomyelitis left foot/ ischemic changes right leg: Seen by vascular surgery with plans for angiography to help revascularize right lower extremity and staged subsequent amputation of toes with debridement of left foot. 2. LGIB: Prior history of arteriovenous malformations and seen by gastroenterology with colonoscopy done 5/4 today w/ blood throughout the colon, unclear source. CTA abd was neg for source. Capsule endoscopy next per GI notes.  3. ESRD: HD TTS.  HD tomorrow, no heparin.  4. Anemia: Acute blood loss anemia superimposed on anemia of ESRD/chronic kidney disease.  Hemodynamically stable and received ESA with dialysis 5/1.  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 6. Nutrition: Continue renal diet with fluid restriction and nutritional supplementation to improve wound healing. 7. BP/volume: BP's on the soft side, getting metoprolol. 2-3 kg under her dry wt. Min UF w/ HD tomorrow.       [name] [name] 05/02/2020, 4:16 PM   Recent Labs  Lab 05/26/2020 0501 05/02/20 0749  K 3.6 4.3  BUN 35* 15  CREATININE 8.65* 5.08*  CALCIUM 7.3* 7.7*  PHOS 5.4* 3.1  HGB 8.0* 8.3*   Inpatient medications: . allopurinol  100 mg Oral Daily  . atorvastatin  40 mg Oral q1800  . buPROPion  150 mg Oral BID  . calcitRIOL  2.25 mcg Oral Q T,Th,Sat-1800  . Chlorhexidine Gluconate Cloth  6 each Topical Q0600  . Chlorhexidine Gluconate Cloth  6 each Topical Q0600  . cinacalcet  30 mg Oral Q T,Th,Sat-1800  . darbepoetin (ARANESP) injection - DIALYSIS  150 mcg Intravenous Q Sat-HD  . feeding supplement  1 Container Oral TID BM  . gabapentin  300 mg Oral QHS  . linaclotide  72 mcg Oral QAC breakfast  . loratadine  10 mg Oral Daily  . [START ON 05/15/2020] metoCLOPramide (REGLAN) injection  10 mg Intravenous Once  . metoprolol tartrate  25 mg Oral BID  . pantoprazole  40 mg Oral Daily  . peg 3350 powder  0.5 kit Oral Once  . polyethylene glycol  17 g Oral BID  . sodium chloride flush  3 mL Intravenous Q12H  . sucroferric oxyhydroxide  1,000 mg Oral TID WC   . sodium chloride    . sodium chloride    . sodium chloride    . piperacillin-tazobactam (ZOSYN)  IV 2.25  g (05/02/20 1549)   sodium chloride, sodium chloride, sodium chloride, acetaminophen, albuterol, alteplase, heparin, lidocaine (PF), lidocaine-prilocaine, metoprolol tartrate, mometasone-formoterol, nitroGLYCERIN, oxyCODONE, pentafluoroprop-tetrafluoroeth, sodium chloride flush

## 2020-05-02 NOTE — Progress Notes (Signed)
  Progress Note    Noted GI recommendations.  Patient undergo capsule endoscopy.  Pending results will need angiography to treat right lower extremity followed by eventual probable right transmetatarsal amputation and debridement of left foot.  She remains high risk for bilateral lower extremity amputation.   Kaylub Detienne C. Donzetta Matters, MD [facility] Office: [phone] Pager: [phone]  05/02/2020 10:26 AM

## 2020-05-02 NOTE — Progress Notes (Signed)
Daily Rounding Note  05/02/2020, 8:56 AM  LOS: 7 days   SUBJECTIVE:   Chief complaint: Hematochezia    Dark chocolate brown stool yesterday, before colonoscopy, no BM since return from endo.  No abd pain or nausea.    OBJECTIVE:         Vital signs in last 24 hours:    Temp:  [97.5 F (36.4 C)-98.8 F (37.1 C)] 98.3 F (36.8 C) (05/05 0832) Pulse Rate:  [47-77] 72 (05/05 0832) Resp:  [13-18] 16 (05/05 0832) BP: (107-153)/(44-103) 115/44 (05/05 0832) SpO2:  [94 %-100 %] 98 % (05/05 0832) Weight:  [115.2 kg] 115.2 kg (05/04 1403) Last BM Date: 05/02/20 Filed Weights   04/28/20 1250 05/19/2020 0810 05/17/2020 1403  Weight: 116.9 kg 115.2 kg 115.2 kg   Not re-examined.    Intake/Output from previous day: 05/04 0701 - 05/05 0700 In: 150 [I.V.:100; IV Piggyback:50] Out: 1000   Intake/Output this shift: No intake/output data recorded.  Lab Results: Recent Labs    05/24/2020 0500 05/08/2020 0501  WBC 7.0 7.5  HGB 7.1* 8.0*  HCT 23.3* 25.6*  PLT 290 301   BMET Recent Labs    05/13/2020 0500 05/09/2020 0501  NA 140 144  K 3.5 3.6  CL 100 101  CO2 25 23  GLUCOSE 106* 99  BUN 30* 35*  CREATININE 7.23* 8.65*  CALCIUM 7.2* 7.3*   LFT Recent Labs    05/11/2020 0500 05/23/2020 0501  ALBUMIN 1.9* 1.9*   PT/INR No results for input(s): LABPROT, INR in the last 72 hours. Hepatitis Panel No results for input(s): HEPBSAG, HCVAB, HEPAIGM, HEPBIGM in the last 72 hours.  Studies/Results: CT Angio Abd/Pel w/ and/or w/o  Result Date: 05/02/2020 CLINICAL DATA:  69 year old female currently COVID-19 positive with bleeding per rectum concerning for lower GI bleed. EXAM: CTA ABDOMEN AND PELVIS WITHOUT AND WITH CONTRAST TECHNIQUE: Multidetector CT imaging of the abdomen and pelvis was performed using the standard protocol during bolus administration of intravenous contrast. Multiplanar reconstructed images and MIPs were obtained  and reviewed to evaluate the vascular anatomy. CONTRAST:  110mL OMNIPAQUE IOHEXOL 350 MG/ML SOLN COMPARISON:  Prior CT scan of the abdomen and pelvis 08/16/2019 FINDINGS: VASCULAR Aorta: Extensive heterogeneous mixed calcified and fibrofatty atherosclerotic plaque throughout the abdominal aorta. The infrarenal aorta is mildly aneurysmal at a maximum of 3 cm in diameter. Celiac: Calcified atherosclerotic plaque at the origin without significant stenosis. No aneurysm or dissection. SMA: Calcified atherosclerotic plaque results in at least moderate stenosis of the proximal SMA. Fibrofatty atherosclerotic plaque results in focal moderate stenosis in the mid SMA. Renals: There are 2 renal arteries bilaterally. All for renal arteries are heavily diseased with mixed calcified and fibrofatty atherosclerotic plaque. At least moderate stenoses present bilaterally worse on the left than the right. IMA: Heavily diseased with high-grade stenosis at the origin. The vessel opacifies distally. Inflow: Mixed calcified and fibrofatty atherosclerotic plaque extends into the inflow vessels bilaterally. Both common iliac arteries are ectatic measuring up to 1.8 cm on the left and 2.1 cm on the right. Focal moderate stenoses at the origins of the internal iliac arteries. 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 No significant stenosis or occlusion of the external iliac arteries. Proximal Outflow: Heavily diseased bilaterally. Moderate to high-grade stenosis of the right common femoral artery. Bilateral profunda femoral arteries are heavily diseased bilaterally. High-grade stenosis bordering on occlusion of the proximal right superficial femoral  artery. Veins: No focal venous abnormality. Review of the MIP images confirms the above findings. NON-VASCULAR Lower chest: The visualized cardiac structures are normal in size. No pericardial effusion. Moderately large hiatal hernia. The lower lungs are clear.  Hepatobiliary: Normal hepatic contour and morphology. Geographic hypoattenuation in the left hemi-liver adjacent to the fissure for the falciform ligament is nonspecific but most suggestive of benign focal fatty infiltration. No discrete solid hepatic lesion. Gallbladder is unremarkable. No intra or extrahepatic biliary ductal dilatation. Pancreas: Unremarkable. No pancreatic ductal dilatation or surrounding inflammatory changes. Spleen: 0.9 cm circumscribed low-attenuation lesion in the posteromedial aspect of the spleen is too small for accurate characterization. Statistically, this is almost certainly a benign lesion such as a cyst. Adrenals/Urinary Tract: Normal adrenal glands. Renal atrophy bilaterally worse on the left than the right likely secondary to chronic ischemia related to renal artery stenoses. 1.0 cm low-attenuation lesion in the right interpolar kidney is too small for accurate characterization but remains unchanged compared to prior studies and is almost certainly benign. Right lower pole renal sinus cyst, stable. Air-fluid level present within the bladder. Additionally, there is a semi lunar collection of fluid surrounding the urethra. Stomach/Bowel: No evidence of contrast extravasation into bowel on either the arterial or portal venous phase. Negative for acute bleeding at this time. No significant diverticular disease. Stomach and duodenum are unremarkable. No evidence of bowel wall thickening or obstruction. A metallic clip is present in the ascending colon. Lymphatic: No suspicious lymphadenopathy. Reproductive: Dystrophic calcified uterine fibroid. No adnexal masses. Other: No significant abdominal wall hernia. No evidence of ascites. Musculoskeletal: No acute fracture or aggressive appearing lytic or blastic osseous lesion. Lower lumbar degenerative disc disease most notable at L5-S1. IMPRESSION: VASCULAR 1. No evidence of active GI bleeding. 2. Severe pan vascular atherosclerotic disease  with multifocal significant areas of stenosis as detailed above. Of note, the pattern of disease is not suggestive of chronic mesenteric ischemia. 3. 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 4. 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 5. 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 Recommend followup by ultrasound in 3 years. This recommendation follows ACR consensus guidelines: White Paper of the ACR Incidental Findings Committee II on Vascular Findings. J Am Coll Radiol 2013; 10:789-794. Aortic aneurysm NOS (ICD10-I71.9); Aortic Atherosclerosis (ICD10-I70.0). NON-VASCULAR 1. No evidence of bowel wall thickening, inflammation or significant diverticulosis. 2. Metallic clip visible in the ascending colon. 3. Bilateral renal atrophy, likely secondary to chronic ischemia related renal artery stenoses. 4. Air-fluid level in the bladder is presumably related to recent instrumentation. If the patient has not been recently catheterized, then cystitis with a gas-forming organism would be a consideration. 5. Urethrocele. 6. Moderate hiatal hernia. 7. Lower lumbar degenerative disc disease. Electronically Signed   By: Jacqulynn Cadet M.D.   On: 05/02/2020 08:47    ASSESMENT:   *  Hematochezia. Multiple endoscopic procedures for occult and gross GI bleeding and anemia.  Most recently:  04/09/20 Entersocopy: HH.  Normal stomach, improved GAVE following APC in 01/2020.  Non-bleeding duodenal AVM treated w APC.    05/12/2020 Colonoscopy:  Residual blood throughout colon and TI.  3 small polyps, not resected.  Non-bleeding int rrhoids.    05/21/2020 CT mesenteric angio: no active bleeding.  Severe vascular dz but pattern not c/w chronic mesenteric ischemia.   Hgb stable. Hgb 7.1 >> 1 PRBC  >>  8.  Awaiting todays CBC.    *   ESRD.    *   Covid 19 + but asymptomatic.  Incidental finding.    *   Left foot  gangrene, non-healing hallux amputation.  PLAN   *  ? What to do with no obvious source of bleeding found? She has not ever had capsule endoscopy so ? Pursue this?    [name] [name]  05/02/2020, 8:56 AM Phone [phone]

## 2020-05-02 NOTE — Progress Notes (Signed)
PROGRESS NOTE                                                                                                                                                                                                             Patient Demographics:    Monna Crean, is a 69 y.o. female, DOB - [birth year], XYV:[id number]  Outpatient Primary MD for the patient is [facility] date - 03/30/2020   LOS - 7  Chief Complaint  Patient presents with  . Altered Mental Status       Brief Narrative: Patient is a 69 y.o. female with PMHx of ESRD, PAD-s/p left great toe amputation on 04/11/2020, CAD s/p CABG, DM-2, HTN-who was transferred from her SNF for evaluation of acute metabolic encephalopathy in the setting of gangrenous changes to the right foot.  She was also incidentally found to have COVID-19.  Hospital course complicated by recurrent GI bleeding of obscure etiology-see below.  Significant Events: 4/6-4/22>> admit to [facility] for upper GI bleeding and blood loss anemia, left great toe osteo s/p amputation 4/28>> admit to [facility] for acute metabolic encephalopathy 2/86>> refused to sign consent for angiogram/amputation-rescheduled for 5/3 4/30>> bloody stools  5/2>> colonoscopy postponed-incomplete prep-due to refusal to complete bowel prep 5/3>> colonoscopy postponed-incomplete prep-due to refusal to complete bowel prep, lower extremity angiogram postponed-due to GI bleeding. 5/4>> colonoscopy  COVID-19 medications: Remdesivir: 4/28>> 4/29  Antibiotics: Vancomycin: 4/28>>5/1 Zosyn: 4/28>>  Microbiology data: 4/28: Blood culture>> no growth 4/28: Wound swab/superficial culture>> Pseudomonas  DVT prophylaxis: Hold Heparin 4/30 2/2 GI bleed  Procedures: 5/4>> colonoscopy-residual blood throughout: 5/4>> CT mesenteric angiogram: No active bleeding source 4/14>> left great toe amputation 4/10>>  EGD  Consults: VVS, renal,GI    Subjective:   Wanting to eat-asking for solid food-no bloody stools this morning.   Assessment  & Plan :   Acute metabolic encephalopathy: Likely secondary to SIRS/right foot gangrene-uremia due to missed HD.  Significantly improved after treatment of SIRS/foot infection with gangrene and dialysis-now back to baseline.  SIRS (present on admission) with right lower extremity critical limb ischemia: Afebrile-SIRS physiology has improved-has gangrenous changes evident on exam-please see pictures taken on 4/28 and prior notes.  Blood cultures negative so far.    Superficial swab on admission positive for Pseudomonas-not sure how accurate this is. No  longer on vancomycin-continue Zosyn. Refused angiogram/amputation on 4/29-was rescheduled for 5/3-however with her ongoing GI bleeding-angiogram postponed for now. Very difficult situation-this patient has known history of AVMs and history of recurrent  GI bleeding requiring PRBC transfusion-and may not be able to tolerate long-term dual antiplatelet therapy.  History of PAD with recent left great toe amputation-now with nonhealing left foot wound: Vascular surgery planning debridement when she consents for procedure.  Remains on statin-aspirin held due to possible GI bleeding.  Recurrent upper GI bleeding-history of AVMs and acute blood loss anemia: Required 1 unit of PRBC transfusion on 5/3-hemoglobin currently stable.  Underwent colonoscopy on 5/4-with blood throughout the colon-no obvious bleeding etiology found-CT mesenteric angiogram without any active bleeding.  GI planning on capsule endoscopy.  Continue to follow CBC.  Hyperkalemia: Secondary to missed HD-resolved with HD.  Follow periodically.  ESRD: Nephrology following and directing care.  Anemia: Multifactorial-likely secondary to kidney disease-worsened by blood loss-being transfused 1 unit of PRBC.  Iron/Aranesp per nephrology.  HTN: BP relatively well  controlled-continue metoprolol  DM-2 with hypoglycemia (A1C 5.1 on 04/03/2020): No further hypoglycemic episodes-continue to hold all insulin for now-she is n.p.o. for colonoscopy.  Not a good candidate for tight glycemic control-allow some amount of permissive hyperglycemia.  Recent Labs    05/09/2020 2205 05/02/20 0814 05/02/20 1201  GLUCAP 98 73 180*   Paroxysmal atrial fibrillation/atrial flutter: Not a new diagnosis-Per discharge summary in October 2020-she also had atrial fibrillation  as well.  Given recurrent GI bleeding/anemia-not a good candidate for anticoagulation.  Continue rate control with metoprolol.  Chronic combined diastolic and systolic heart failure (EF 45-50% by TTE on 04/28/2019): Volume status stable-diuresis with HD.  History of CVA: Continue statin-see above regarding aspirin and anticoagulation  Gout: Not in flare-continue allopurinol  Covid 19 Viral infection: Appears to be asymptomatic--on room air this morning.  Chest x-ray on admission did not show pneumonia.  Fever: afebrile  O2 requirements:  SpO2: 98 % O2 Flow Rate (L/min): 2 L/min   COVID-19 Labs: No results for input(s): DDIMER, FERRITIN, LDH, CRP in the last 72 hours.     Component Value Date/Time   BNP 897.8 (H) 01/01/2020 0600    No results for input(s): PROCALCITON in the last 168 hours.  Lab Results  Component Value Date   SARSCOV2NAA POSITIVE (A) 04/03/2020   [place] NEGATIVE 04/16/2020   [place] NEGATIVE 04/03/2020   [place] NEGATIVE 03/15/2020     Morbid Obesity: Estimated body mass index is 39.78 kg/m as calculated from the following:   Height as of this encounter: '5\' 7"'  (1.702 m).   Weight as of this encounter: 115.2 kg.     ABG:    Component Value Date/Time   PHART 7.419 02/05/2020 1114   PCO2ART 43.0 02/05/2020 1114   PO2ART 85.1 02/05/2020 1114   HCO3 27.2 02/05/2020 1114   TCO2 22 01/01/2020 0614   ACIDBASEDEF 6.0 (H) 01/01/2020 0614   O2SAT 95.3  02/05/2020 1114    Vent Settings: N/A    Condition - Guarded  Family Communication  : Spoke to aunt on 5/3-we will update family on 5/5 once GI work-up is complete.  Code Status :  Full Code  Diet :  Diet Order            Diet NPO time specified Except for: Sips with Meds  Diet effective ____        Diet clear liquid Room service appropriate? Yes; Fluid consistency: Thin  Diet effective now  Disposition Plan  :  Status is: Inpatient  Remains inpatient appropriate because:Inpatient level of care appropriate due to severity of illness  Dispo: The patient is from: SNF              Anticipated d/c is to: SNF              Anticipated d/c date is: > 3 days              Patient currently is not medically stable to d/c.  Barriers to discharge: Ongoing GI bleeding without obvious source-right foot gangrene on IV antibiotics-probably will require revascularization/amputation once GI bleeding is controlled.  Antimicorbials  :    Anti-infectives (From admission, onward)   Start     Dose/Rate Route Frequency Ordered Stop   04/28/20 1200  vancomycin (VANCOCIN) IVPB 1000 mg/200 mL premix  Status:  Discontinued     1,000 mg 200 mL/hr over 60 Minutes Intravenous Every T-Th-Sa (Hemodialysis) 04/27/20 1154 04/28/20 1218   04/26/20 1400  piperacillin-tazobactam (ZOSYN) IVPB 2.25 g     2.25 g 100 mL/hr over 30 Minutes Intravenous Every 8 hours 04/26/20 1314     04/26/20 1000  remdesivir 100 mg in sodium chloride 0.9 % 100 mL IVPB  Status:  Discontinued     100 mg 200 mL/hr over 30 Minutes Intravenous Daily 04/05/2020 1952 04/26/20 1105   04/07/2020 2030  remdesivir 200 mg in sodium chloride 0.9% 250 mL IVPB     200 mg 580 mL/hr over 30 Minutes Intravenous Once 04/14/2020 1952 04/03/2020 2327   04/13/2020 1515  vancomycin (VANCOCIN) 2,500 mg in sodium chloride 0.9 % 500 mL IVPB     2,500 mg 250 mL/hr over 120 Minutes Intravenous  Once 03/29/2020 1513 04/24/2020 2026   04/23/2020 1513   vancomycin variable dose per unstable renal function (pharmacist dosing)  Status:  Discontinued      Does not apply See admin instructions 04/08/2020 1514 04/28/20 1218   04/20/2020 1500  piperacillin-tazobactam (ZOSYN) IVPB 3.375 g     3.375 g 100 mL/hr over 30 Minutes Intravenous  Once 04/12/2020 1450 04/20/2020 2026      Inpatient Medications  Scheduled Meds: . allopurinol  100 mg Oral Daily  . atorvastatin  40 mg Oral q1800  . buPROPion  150 mg Oral BID  . calcitRIOL  2.25 mcg Oral Q T,Th,Sat-1800  . [START ON 05/12/2020] Chlorhexidine Gluconate Cloth  6 each Topical Q0600  . cinacalcet  30 mg Oral Q T,Th,Sat-1800  . darbepoetin (ARANESP) injection - DIALYSIS  150 mcg Intravenous Q Sat-HD  . feeding supplement  1 Container Oral TID BM  . gabapentin  300 mg Oral QHS  . linaclotide  72 mcg Oral QAC breakfast  . loratadine  10 mg Oral Daily  . [START ON 05/28/2020] metoCLOPramide (REGLAN) injection  10 mg Intravenous Once  . metoprolol tartrate  25 mg Oral BID  . pantoprazole  40 mg Oral Daily  . peg 3350 powder  0.5 kit Oral Once  . polyethylene glycol  17 g Oral BID  . sodium chloride flush  3 mL Intravenous Q12H  . sucroferric oxyhydroxide  1,000 mg Oral TID WC   Continuous Infusions: . sodium chloride    . sodium chloride    . sodium chloride    . piperacillin-tazobactam (ZOSYN)  IV 2.25 g (05/02/20 1549)   PRN Meds:.sodium chloride, sodium chloride, sodium chloride, acetaminophen, albuterol, alteplase, heparin, lidocaine (PF), lidocaine-prilocaine, metoprolol tartrate, mometasone-formoterol, nitroGLYCERIN, oxyCODONE, pentafluoroprop-tetrafluoroeth, sodium chloride  flush   Time Spent in minutes 25   See all Orders from today for further details   Oren Binet M.D on 05/02/2020 at 4:43 PM  To page go to [email or web address] - use universal password  [facility] -  Office  [phone]    Objective:   Vitals:   05/02/20 0400 05/02/20 0402 05/02/20 0832 05/02/20 0949  BP:  110/62  (!) 115/44 94/81  Pulse:   72 61  Resp: 15  16   Temp:  97.8 F (36.6 C) 98.3 F (36.8 C)   TempSrc:   Oral   SpO2: 100%  98%   Weight:      Height:        Wt Readings from Last 3 Encounters:  05/28/2020 115.2 kg  04/19/20 118.4 kg  03/02/20 120.2 kg     Intake/Output Summary (Last 24 hours) at 05/02/2020 1643 Last data filed at 05/02/2020 0900 Gross per 24 hour  Intake 170 ml  Output --  Net 170 ml     Physical Exam Gen Exam:Alert awake-not in any distress HEENT:atraumatic, normocephalic Chest: B/L clear to auscultation anteriorly CVS:S1S2 regular Abdomen:soft non tender, non distended Extremities:trace edema Neurology: Non focal Skin: no rash   Data Review:    CBC Recent Labs  Lab 04/28/20 0719 04/29/20 0632 05/21/2020 0500 05/21/2020 0501 05/02/20 0749  WBC 7.6 7.2 7.0 7.5 9.3  HGB 7.7* 8.1* 7.1* 8.0* 8.3*  HCT 25.0* 26.7* 23.3* 25.6* 26.7*  PLT 313 311 290 301 343  MCV 84.5 84.5 85.0 87.7 87.8  MCH 26.0 25.6* 25.9* 27.4 27.3  MCHC 30.8 30.3 30.5 31.3 31.1  RDW 19.0* 18.6* 18.9* 18.7* 18.7*  LYMPHSABS 1.3  --   --   --   --   MONOABS 0.6  --   --   --   --   EOSABS 0.1  --   --   --   --   BASOSABS 0.0  --   --   --   --     Chemistries  Recent Labs  Lab 04/28/20 0719 04/29/20 0632 04/28/2020 0500 05/18/2020 0501 05/02/20 0749  NA 142 140 140 144 138  K 4.5 3.9 3.5 3.6 4.3  CL 101 100 100 101 99  CO2 '23 25 25 23 25  ' GLUCOSE 75 97 106* 99 74  BUN 57* 27* 30* 35* 15  CREATININE 9.73* 5.84* 7.23* 8.65* 5.08*  CALCIUM 7.4* 7.6* 7.2* 7.3* 7.7*   ------------------------------------------------------------------------------------------------------------------ No results for input(s): CHOL, HDL, LDLCALC, TRIG, CHOLHDL, LDLDIRECT in the last 72 hours.  Lab Results  Component Value Date   HGBA1C 5.1 04/03/2020   ------------------------------------------------------------------------------------------------------------------ No results for  input(s): TSH, T4TOTAL, T3FREE, THYROIDAB in the last 72 hours.  Invalid input(s): FREET3 ------------------------------------------------------------------------------------------------------------------ No results for input(s): VITAMINB12, FOLATE, FERRITIN, TIBC, IRON, RETICCTPCT in the last 72 hours.  Coagulation profile No results for input(s): INR, PROTIME in the last 168 hours.  No results for input(s): DDIMER in the last 72 hours.  Cardiac Enzymes No results for input(s): CKMB, TROPONINI, MYOGLOBIN in the last 168 hours.  Invalid input(s): CK ------------------------------------------------------------------------------------------------------------------    Component Value Date/Time   BNP 897.8 (H) 01/01/2020 0600    Micro Results Recent Results (from the past 240 hour(s))  Culture, blood (Routine x 2)     Status: None   Collection Time: 03/31/2020  1:00 PM   Specimen: BLOOD  Result Value Ref Range Status   Specimen Description BLOOD RIGHT ANTECUBITAL  Final   Special  Requests   Final    BOTTLES DRAWN AEROBIC AND ANAEROBIC Blood Culture results may not be optimal due to an inadequate volume of blood received in culture bottles   Culture   Final    NO GROWTH 5 DAYS Performed at [facility], [place] [address] [place], [place] [address]    Report Status 05/04/2020 FINAL  Final  Culture, blood (Routine x 2)     Status: None   Collection Time: 04/15/2020  1:20 PM   Specimen: BLOOD RIGHT HAND  Result Value Ref Range Status   Specimen Description BLOOD RIGHT HAND  Final   Special Requests   Final    BOTTLES DRAWN AEROBIC ONLY Blood Culture results may not be optimal due to an inadequate volume of blood received in culture bottles   Culture   Final    NO GROWTH 5 DAYS Performed at [facility], [place] [address], [place], [place] [address]    Report Status 05/27/2020 FINAL  Final  Respiratory Panel by RT PCR (Flu A&B, Covid) - Nasopharyngeal Swab     Status:  Abnormal   Collection Time: 04/02/2020  3:58 PM   Specimen: Nasopharyngeal Swab  Result Value Ref Range Status   SARS Coronavirus 2 by RT PCR POSITIVE (A) NEGATIVE Final    Comment: RESULT CALLED TO, READ BACK BY AND VERIFIED WITH: K [facility] RN 04/18/2020 1751 JDW (NOTE) SARS-CoV-2 target nucleic acids are DETECTED. SARS-CoV-2 RNA is generally detectable in upper respiratory specimens  during the acute phase of infection. Positive results are indicative of the presence of the identified virus, but do not rule out bacterial infection or co-infection with other pathogens not detected by the test. Clinical correlation with patient history and other diagnostic information is necessary to determine patient infection status. The expected result is Negative. Fact Sheet for Patients:  PinkCheek.be Fact Sheet for Healthcare Providers: GravelBags.it This test is not yet approved or cleared by the [place] FDA and  has been authorized for detection and/or diagnosis of SARS-CoV-2 by FDA under an Emergency Use Authorization (EUA).  This EUA will remain in effect (meaning this test can be used) for th e duration of  the COVID-19 declaration under Section 564(b)(1) of the Act, 21 U.S.C. section 360bbb-3(b)(1), unless the authorization is terminated or revoked sooner.    Influenza A by PCR NEGATIVE NEGATIVE Final   Influenza B by PCR NEGATIVE NEGATIVE Final    Comment: (NOTE) The Xpert Xpress SARS-CoV-2/FLU/RSV assay is intended as an aid in  the diagnosis of influenza from Nasopharyngeal swab specimens and  should not be used as a sole basis for treatment. Nasal washings and  aspirates are unacceptable for Xpert Xpress SARS-CoV-2/FLU/RSV  testing. Fact Sheet for Patients: PinkCheek.be Fact Sheet for Healthcare Providers: GravelBags.it This test is not yet approved or cleared by  the [place] FDA and  has been authorized for detection and/or diagnosis of SARS-CoV-2 by  FDA under an Emergency Use Authorization (EUA). This EUA will remain  in effect (meaning this test can be used) for the duration of the  Covid-19 declaration under Section 564(b)(1) of the Act, 21  U.S.C. section 360bbb-3(b)(1), unless the authorization is  terminated or revoked. Performed at [facility], [place] [address], [place], [place] [address]   Wound or Superficial Culture     Status: None   Collection Time: 04/13/2020  8:43 PM   Specimen: Toe; Wound  Result Value Ref Range Status   Specimen Description  TOE  Final   Special Requests RIGHT  Final   Gram Stain   Final    NO WBC SEEN NO ORGANISMS SEEN Performed at [facility], [place] [address], [place], [place] [address]    Culture RARE PSEUDOMONAS AERUGINOSA  Final   Report Status 04/28/2020 FINAL  Final   Organism ID, Bacteria PSEUDOMONAS AERUGINOSA  Final      Susceptibility   Pseudomonas aeruginosa - MIC*    CEFTAZIDIME 4 SENSITIVE Sensitive     CIPROFLOXACIN <=0.25 SENSITIVE Sensitive     GENTAMICIN 4 SENSITIVE Sensitive     IMIPENEM 2 SENSITIVE Sensitive     PIP/TAZO 8 SENSITIVE Sensitive     CEFEPIME 4 SENSITIVE Sensitive     * RARE PSEUDOMONAS AERUGINOSA    Radiology Reports DG Chest 2 View  Result Date: 04/16/2020 CLINICAL DATA:  69 year old female with suspected sepsis. Bilateral foot infection for the past 3 weeks. EXAM: CHEST - 2 VIEW COMPARISON:  Chest x-ray 04/04/2020. FINDINGS: Lung volumes are normal. No consolidative airspace disease. No pleural effusions. No evidence of frank pulmonary edema. Mild cephalization of the pulmonary vasculature. Mild cardiomegaly. Upper mediastinal contours are within normal limits. Aortic atherosclerosis. Status post median sternotomy for CABG. Right internal jugular PermCath with tip terminating in the mid to distal superior vena cava. Aortic atherosclerosis. IMPRESSION: 1.  Postoperative changes and support apparatus, as above. 2. Mild cardiomegaly with pulmonary venous congestion, without frank pulmonary edema. 3. Aortic atherosclerosis. Electronically Signed   By: Vinnie Langton M.D.   On: 04/08/2020 14:52   CT FOOT RIGHT WO CONTRAST  Result Date: 04/12/2020 CLINICAL DATA:  Pain and swelling. EXAM: CT OF THE RIGHT FOOT WITHOUT CONTRAST TECHNIQUE: Multidetector CT imaging of the right foot was performed according to the standard protocol. Multiplanar CT image reconstructions were also generated. COMPARISON:  Radiographs 04/09/2020 FINDINGS: Diffuse subcutaneous soft tissue swelling/edema/fluid involving the entire ankle and foot. No focal fluid collection to suggest a drainable soft tissue abscess. Marked fatty atrophy of the foot and ankle musculature. Suspect myofasciitis involving the plantar foot musculature but no definite findings for pyomyositis. Suspect an open wound along the plantar aspect of the great toe but I do not see any obvious destructive bony changes to suggest osteomyelitis. No definite findings for septic arthritis. The other bony structures of the foot and ankle appear intact. No obvious destructive bony changes, fractures or osteochondral lesions. Extensive small artery calcifications. IMPRESSION: 1. Diffuse subcutaneous soft tissue swelling/edema/fluid involving the entire ankle and foot consistent with cellulitis. No focal fluid collection to suggest a drainable soft tissue abscess. 2. Suspect myofasciitis involving the plantar foot musculature but no definite findings for pyomyositis. Severe diffuse fatty atrophy of the foot and ankle musculature. 3. Suspect an open wound along the plantar aspect of the great toe but I do not see any obvious destructive bony changes to suggest osteomyelitis. No definite findings for septic arthritis. 4. If symptoms persist or worsen MRI is suggested for further evaluation. Electronically Signed   By: Marijo Sanes M.D.    On: 04/12/2020 10:51   DG CHEST PORT 1 VIEW  Result Date: 04/04/2020 CLINICAL DATA:  69 year old female with fever. EXAM: PORTABLE CHEST 1 VIEW COMPARISON:  Chest radiograph dated 02/24/2020. FINDINGS: Right-sided dialysis catheter in similar position. There is cardiomegaly with mild vascular congestion. No focal consolidation, pleural effusion or pneumothorax. Median sternotomy wires and CABG vascular clips. No acute osseous pathology. IMPRESSION: Cardiomegaly with mild vascular congestion. No focal consolidation. Electronically Signed  By: Anner Crete M.D.   On: 04/04/2020 17:29   DG ABD ACUTE 2+V W 1V CHEST  Result Date: 04/09/2020 CLINICAL DATA:  Fever with low hemoglobin. EXAM: DG ABDOMEN ACUTE W/ 1V CHEST COMPARISON:  Abdominal film 06/26/2018 and chest x-ray 02/24/2020 FINDINGS: Right IJ central venous catheter unchanged with tip over the SVC. Lungs are adequately inflated with minimal hazy prominence of the perihilar vessels unchanged and likely due to mild chronic vascular congestion. No lobar consolidation or effusion. Mild stable cardiomegaly. Remainder the chest is unchanged. There are several air-filled large and small bowel loops. A few air-filled small bowel loops at the upper limits of normal in diameter over the left abdomen. No evidence of air-fluid levels or free peritoneal air. Remainder of the exam is unchanged. IMPRESSION: Nonspecific, nonobstructive bowel gas pattern with a few air-filled mildly prominent, but nondilated small bowel loops in the left abdomen. Stable mild cardiomegaly with suggestion of minimal chronic vascular congestion. Electronically Signed   By: Marin Olp M.D.   On: 04/09/2020 09:57   DG Foot Complete Left  Result Date: 04/24/2020 CLINICAL DATA:  Recent amputation. Suspect sepsis. Bilateral foot infection 3 weeks. EXAM: LEFT FOOT - COMPLETE 3+ VIEW COMPARISON:  04/09/2020 FINDINGS: There is generalized decreased bone mineralization. Evidence of  patient's recent amputation distal to the distal shaft of the first metatarsal. Expected postsurgical changes at the amputation site. No focal bone destruction to suggest osteomyelitis. No air within the soft tissues. Exam is otherwise unchanged. IMPRESSION: Expected changes post amputation distal to the distal aspect of the first metatarsal. No definite bone destruction to suggest osteomyelitis. No air within the soft tissues. Electronically Signed   By: Marin Olp M.D.   On: 04/23/2020 14:58   DG Foot Complete Left  Result Date: 04/09/2020 CLINICAL DATA:  Foot pain and swelling EXAM: LEFT FOOT - COMPLETE 3+ VIEW COMPARISON:  02/01/2020 MRI, 01/31/2020 plain film FINDINGS: There is been interval amputation of the first toe. Persistent soft tissue wound is seen. There is lucency identified in the head of the first metatarsal suspicious for recurrent osteomyelitis. No other fracture or dislocation is seen. No other soft tissue abnormality is noted. IMPRESSION: Changes suspicious for osteomyelitis in the head of the first metatarsal. Overlying soft tissue wound is noted. Electronically Signed   By: Inez Catalina M.D.   On: 04/09/2020 19:40   DG Foot Complete Right  Result Date: 03/29/2020 CLINICAL DATA:  Recent amputation with possible infection three weeks. Suspect sepsis. EXAM: RIGHT FOOT COMPLETE - 3+ VIEW COMPARISON:  CT 04/12/2020 FINDINGS: Diffuse decreased bone mineralization. Degenerative change over the first MTP joint and interphalangeal joints. Mild horizontal sclerosis with subtle lucency involving the head of the fifth metatarsal possibly subacute fracture. No focal bone destruction to suggest osteomyelitis. No air within the soft tissues. IMPRESSION: 1. No definite evidence of osteomyelitis and no air within the soft tissues. 2. Linear sclerosis with subtle lucency involving the head of the fifth metatarsal which may be due to nondisplaced subacute fracture. Electronically Signed   By: Marin Olp M.D.   On: 04/08/2020 14:56   VAS [place] LOWER EXTREMITY VENOUS (DVT)  Result Date: 04/10/2020  Lower Venous DVTStudy Indications: Edema, and fever.  Limitations: Body habitus and poor ultrasound/tissue interface. Comparison Study: 04/28/19 previous Performing Technologist: Abram Sander RVS  Examination Guidelines: A complete evaluation includes B-mode imaging, spectral Doppler, color Doppler, and power Doppler as needed of all accessible portions of each vessel. Bilateral testing is considered an  integral part of a complete examination. Limited examinations for reoccurring indications may be performed as noted. The reflux portion of the exam is performed with the patient in reverse Trendelenburg.  +---------+---------------+---------+-----------+----------+--------------+ RIGHT    CompressibilityPhasicitySpontaneityPropertiesThrombus Aging +---------+---------------+---------+-----------+----------+--------------+ CFV      Full           Yes      Yes                                 +---------+---------------+---------+-----------+----------+--------------+ SFJ      Full                                                        +---------+---------------+---------+-----------+----------+--------------+ FV Prox  Full                                                        +---------+---------------+---------+-----------+----------+--------------+ FV Mid   Full                                                        +---------+---------------+---------+-----------+----------+--------------+ FV DistalFull                                                        +---------+---------------+---------+-----------+----------+--------------+ PFV      Full                                                        +---------+---------------+---------+-----------+----------+--------------+ POP      Full           Yes      Yes                                  +---------+---------------+---------+-----------+----------+--------------+ PTV      Full                                                        +---------+---------------+---------+-----------+----------+--------------+ PERO                                                  Not visualized +---------+---------------+---------+-----------+----------+--------------+   +---------+---------------+---------+-----------+----------+--------------+ LEFT     CompressibilityPhasicitySpontaneityPropertiesThrombus Aging +---------+---------------+---------+-----------+----------+--------------+ CFV      Full  Yes      Yes                                 +---------+---------------+---------+-----------+----------+--------------+ SFJ      Full                                                        +---------+---------------+---------+-----------+----------+--------------+ FV Prox  Full                                                        +---------+---------------+---------+-----------+----------+--------------+ FV Mid   Full                                                        +---------+---------------+---------+-----------+----------+--------------+ FV DistalFull                                                        +---------+---------------+---------+-----------+----------+--------------+ PFV      Full                                                        +---------+---------------+---------+-----------+----------+--------------+ POP      Full           Yes      Yes                                 +---------+---------------+---------+-----------+----------+--------------+ PTV      Full                                                        +---------+---------------+---------+-----------+----------+--------------+ PERO                                                  Not visualized  +---------+---------------+---------+-----------+----------+--------------+     Summary: BILATERAL: - No evidence of deep vein thrombosis seen in the lower extremities, bilaterally.   *See table(s) above for measurements and observations. Electronically signed by Deitra Mayo MD on 04/10/2020 at 3:24:49 PM.    Final    CT Angio Abd/Pel w/ and/or w/o  Result Date: 05/02/2020 CLINICAL DATA:  69 year old female currently COVID-19 positive with bleeding per rectum concerning for lower GI bleed. EXAM: CTA ABDOMEN AND PELVIS WITHOUT AND WITH  CONTRAST TECHNIQUE: Multidetector CT imaging of the abdomen and pelvis was performed using the standard protocol during bolus administration of intravenous contrast. Multiplanar reconstructed images and MIPs were obtained and reviewed to evaluate the vascular anatomy. CONTRAST:  16m OMNIPAQUE IOHEXOL 350 MG/ML SOLN COMPARISON:  Prior CT scan of the abdomen and pelvis 08/16/2019 FINDINGS: VASCULAR Aorta: Extensive heterogeneous mixed calcified and fibrofatty atherosclerotic plaque throughout the abdominal aorta. The infrarenal aorta is mildly aneurysmal at a maximum of 3 cm in diameter. Celiac: Calcified atherosclerotic plaque at the origin without significant stenosis. No aneurysm or dissection. SMA: Calcified atherosclerotic plaque results in at least moderate stenosis of the proximal SMA. Fibrofatty atherosclerotic plaque results in focal moderate stenosis in the mid SMA. Renals: There are 2 renal arteries bilaterally. All for renal arteries are heavily diseased with mixed calcified and fibrofatty atherosclerotic plaque. At least moderate stenoses present bilaterally worse on the left than the right. IMA: Heavily diseased with high-grade stenosis at the origin. The vessel opacifies distally. Inflow: Mixed calcified and fibrofatty atherosclerotic plaque extends into the inflow vessels bilaterally. Both common iliac arteries are ectatic measuring up to 1.8 cm on the left  and 2.1 cm on the right. Focal moderate stenoses at the origins of the internal iliac arteries. 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 No significant stenosis or occlusion of the external iliac arteries. Proximal Outflow: Heavily diseased bilaterally. Moderate to high-grade stenosis of the right common femoral artery. Bilateral profunda femoral arteries are heavily diseased bilaterally. High-grade stenosis bordering on occlusion of the proximal right superficial femoral artery. Veins: No focal venous abnormality. Review of the MIP images confirms the above findings. NON-VASCULAR Lower chest: The visualized cardiac structures are normal in size. No pericardial effusion. Moderately large hiatal hernia. The lower lungs are clear. Hepatobiliary: Normal hepatic contour and morphology. Geographic hypoattenuation in the left hemi-liver adjacent to the fissure for the falciform ligament is nonspecific but most suggestive of benign focal fatty infiltration. No discrete solid hepatic lesion. Gallbladder is unremarkable. No intra or extrahepatic biliary ductal dilatation. Pancreas: Unremarkable. No pancreatic ductal dilatation or surrounding inflammatory changes. Spleen: 0.9 cm circumscribed low-attenuation lesion in the posteromedial aspect of the spleen is too small for accurate characterization. Statistically, this is almost certainly a benign lesion such as a cyst. Adrenals/Urinary Tract: Normal adrenal glands. Renal atrophy bilaterally worse on the left than the right likely secondary to chronic ischemia related to renal artery stenoses. 1.0 cm low-attenuation lesion in the right interpolar kidney is too small for accurate characterization but remains unchanged compared to prior studies and is almost certainly benign. Right lower pole renal sinus cyst, stable. Air-fluid level present within the bladder. Additionally, there is a semi lunar collection of fluid surrounding the urethra.  Stomach/Bowel: No evidence of contrast extravasation into bowel on either the arterial or portal venous phase. Negative for acute bleeding at this time. No significant diverticular disease. Stomach and duodenum are unremarkable. No evidence of bowel wall thickening or obstruction. A metallic clip is present in the ascending colon. Lymphatic: No suspicious lymphadenopathy. Reproductive: Dystrophic calcified uterine fibroid. No adnexal masses. Other: No significant abdominal wall hernia. No evidence of ascites. Musculoskeletal: No acute fracture or aggressive appearing lytic or blastic osseous lesion. Lower lumbar degenerative disc disease most notable at L5-S1. IMPRESSION: VASCULAR 1. No evidence of active GI bleeding. 2. 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 Of note, the pattern of disease is not suggestive of  chronic mesenteric ischemia. 3. 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 4. 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 5. 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 Recommend followup by ultrasound in 3 years. This recommendation follows ACR consensus guidelines: White Paper of the ACR Incidental Findings Committee II on Vascular Findings. J Am Coll Radiol 2013; 10:789-794. Aortic aneurysm NOS (ICD10-I71.9); Aortic Atherosclerosis (ICD10-I70.0). NON-VASCULAR 1. No evidence of bowel wall thickening, inflammation or significant diverticulosis. 2. Metallic clip visible in the ascending colon. 3. Bilateral renal atrophy, likely secondary to chronic ischemia related renal artery stenoses. 4. Air-fluid level in the bladder is presumably related to recent instrumentation. If the patient has not been recently catheterized, then cystitis with a gas-forming organism would be a consideration. 5. Urethrocele. 6. Moderate hiatal hernia.  7. Lower lumbar degenerative disc disease. Electronically Signed   By: Jacqulynn Cadet M.D.   On: 05/02/2020 08:47

## 2020-05-02 NOTE — Progress Notes (Signed)
Renal Navigator spoke with Henrine Screws W/Clinic Manager at [facility] regarding plan for OP HD treatment at discharge due to [place] positive status. She states that patient will treat on a TTS schedule with a seat time of 5:30pm. Navigator will continue to follow, as patient came in from a SNF and may prove difficult to place when she is medically ready for discharge now that she is COVID positive and needing HD on 3rd shift. Navigator notified CSW/N. Rayyan.  Alphonzo Cruise, [place] Renal Navigator [phone]

## 2020-05-02 NOTE — Progress Notes (Signed)
Initial Nutrition Assessment  DOCUMENTATION CODES:   Obesity unspecified  INTERVENTION:  Provide Boost Breeze po TID, each supplement provides 250 kcal and 9 grams of protein.  Encourage adequate PO intake.   NUTRITION DIAGNOSIS:   Increased nutrient needs related to chronic illness(ESRD on HD, wound healing) as evidenced by estimated needs.  GOAL:   Patient will meet greater than or equal to 90% of their needs  MONITOR:   PO intake, Supplement acceptance, Skin, Weight trends, Labs, I & O's, Diet advancement  REASON FOR ASSESSMENT:   NPO/Clear Liquid Diet    ASSESSMENT:   69 y.o. female with PMHx of ESRD on HD, PAD-s/p left great toe amputation on 04/11/2020, CAD s/p CABG, DM-2, HTN-who was transferred from her SNF for evaluation of acute metabolic encephalopathy in the setting of gangrenous changes to the right foot. Pt found to have COVID-19.  RD working remotely.  Pt with hematochezia, ongoing GI blood loss anemia. Colonoscopy yesterday showed blood throughout the entire colon and term ileum. Plan for pt to undergo capsule endoscopy. Pt has been mostly NPO/clear liquids since admission. RD to order nutritional supplements to aid in caloric and protein needs. Pt with osteomyelitis left foot/ ischemic changes right leg. Possible plans for right transmetatarsal amputation and debridement of left foot.  Unable to complete Nutrition-Focused physical exam at this time.   Labs and medications reviewed.   Diet Order:   Diet Order            Diet NPO time specified Except for: Sips with Meds  Diet effective ____        Diet clear liquid Room service appropriate? Yes; Fluid consistency: Thin  Diet effective now              EDUCATION NEEDS:   Not appropriate for education at this time  Skin:  Skin Assessment: Skin Integrity Issues: Skin Integrity Issues:: Incisions, Other (Comment) Incisions: L foot Other: non pressure wound buttocks  Last BM:  5/5  Height:    Ht Readings from Last 1 Encounters:  05/06/2020 5\' 7"  (1.702 m)    Weight:   Wt Readings from Last 1 Encounters:  04/30/2020 115.2 kg    BMI:  Body mass index is 39.78 kg/m.  Estimated Nutritional Needs:   Kcal:  2100-2300  Protein:  110-125 grams  Fluid:  1 L + UOP   Corrin Parker, MS, RD, LDN RD pager number/after hours weekend pager number on Amion.

## 2020-05-03 ENCOUNTER — Encounter (HOSPITAL_COMMUNITY): Admission: EM | Disposition: E | Payer: Self-pay | Source: Skilled Nursing Facility | Attending: Internal Medicine

## 2020-05-03 ENCOUNTER — Encounter: Payer: Self-pay | Admitting: Internal Medicine

## 2020-05-03 DIAGNOSIS — R933 Abnormal findings on diagnostic imaging of other parts of digestive tract: Secondary | ICD-10-CM

## 2020-05-03 DIAGNOSIS — D62 Acute posthemorrhagic anemia: Secondary | ICD-10-CM

## 2020-05-03 DIAGNOSIS — Z9889 Other specified postprocedural states: Secondary | ICD-10-CM

## 2020-05-03 DIAGNOSIS — K3189 Other diseases of stomach and duodenum: Secondary | ICD-10-CM

## 2020-05-03 HISTORY — PX: GIVENS CAPSULE STUDY: SHX5432

## 2020-05-03 LAB — TYPE AND SCREEN
ABO/RH(D): O POS
Antibody Screen: NEGATIVE
Unit division: 0
Unit division: 0

## 2020-05-03 LAB — RENAL FUNCTION PANEL
Albumin: 1.8 g/dL — ABNORMAL LOW (ref 3.5–5.0)
Anion gap: 11 (ref 5–15)
BUN: 19 mg/dL (ref 8–23)
CO2: 25 mmol/L (ref 22–32)
Calcium: 7.2 mg/dL — ABNORMAL LOW (ref 8.9–10.3)
Chloride: 101 mmol/L (ref 98–111)
Creatinine, Ser: 6.48 mg/dL — ABNORMAL HIGH (ref 0.44–1.00)
GFR calc Af Amer: 7 mL/min — ABNORMAL LOW (ref >60)
GFR calc non Af Amer: 6 mL/min — ABNORMAL LOW (ref >60)
Glucose, Bld: 93 mg/dL (ref 70–99)
Phosphorus: 3.4 mg/dL (ref 2.5–4.6)
Potassium: 3.8 mmol/L (ref 3.5–5.1)
Sodium: 137 mmol/L (ref 135–145)

## 2020-05-03 LAB — BPAM RBC
Blood Product Expiration Date: 202105272359
Blood Product Expiration Date: 202106032359
ISSUE DATE / TIME: 202105040014
Unit Type and Rh: 5100
Unit Type and Rh: 5100

## 2020-05-03 LAB — CBC
HCT: 22.7 % — ABNORMAL LOW (ref 36.0–46.0)
Hemoglobin: 7 g/dL — ABNORMAL LOW (ref 12.0–15.0)
MCH: 27 pg (ref 26.0–34.0)
MCHC: 30.8 g/dL (ref 30.0–36.0)
MCV: 87.6 fL (ref 80.0–100.0)
Platelets: 293 10*3/uL (ref 150–400)
RBC: 2.59 MIL/uL — ABNORMAL LOW (ref 3.87–5.11)
RDW: 19.1 % — ABNORMAL HIGH (ref 11.5–15.5)
WBC: 6.6 10*3/uL (ref 4.0–10.5)
nRBC: 2.6 % — ABNORMAL HIGH (ref 0.0–0.2)

## 2020-05-03 LAB — GLUCOSE, CAPILLARY
Glucose-Capillary: 73 mg/dL (ref 70–99)
Glucose-Capillary: 82 mg/dL (ref 70–99)
Glucose-Capillary: 119 mg/dL — ABNORMAL HIGH (ref 70–99)

## 2020-05-03 LAB — PREPARE RBC (CROSSMATCH)

## 2020-05-03 SURGERY — IMAGING PROCEDURE, GI TRACT, INTRALUMINAL, VIA CAPSULE

## 2020-05-03 MED ORDER — DIPHENHYDRAMINE HCL 50 MG/ML IJ SOLN: 25.0000 mg | Freq: Once | INTRAMUSCULAR | Status: DC

## 2020-05-03 MED ORDER — ALBUMIN HUMAN 25 % IV SOLN
25.0000 g | Freq: Once | INTRAVENOUS | Status: AC
  Administered 2020-05-03: 25 g via INTRAVENOUS

## 2020-05-03 MED ORDER — SODIUM CHLORIDE 0.9% FLUSH: 10.0000 mL | INTRAVENOUS | Status: DC | PRN

## 2020-05-03 MED ORDER — SODIUM CHLORIDE 0.9% IV SOLUTION: Freq: Once | INTRAVENOUS | Status: DC

## 2020-05-03 MED ORDER — ALBUMIN HUMAN 25 % IV SOLN
INTRAVENOUS | Status: AC
  Filled 2020-05-03: qty 100

## 2020-05-03 MED ORDER — ACETAMINOPHEN 325 MG PO TABS: 650.0000 mg | ORAL_TABLET | Freq: Once | ORAL | Status: DC

## 2020-05-03 NOTE — Progress Notes (Addendum)
[facility] Progress Note  Subjective:  Pt seen on HD. BP's dropped w/ UF, better after prbc x 1 and albumin.   Vitals:   05/28/2020 1410 05/12/2020 1430 05/21/2020 1500 04/29/2020 1530  BP: (!) 167/64 (!) 151/47 (!) 104/50 (!) 144/44  Pulse: 76 69 61 72  Resp:      Temp:    98.8 F (37.1 C)  TempSrc:    Oral  SpO2:    100%  Weight:      Height:        Exam:  alert, no distress  no jvd  chest CTA bilat, R TDC   Cor reg no RG   Abd obese soft ntnd   Ext left foot dressed, R foot w/ ischemic discoloration, no sig LE edema   Neuro NF, ox 3   Dialysis: [facility] TTS (COVID shift, will go back to MWF after 21 day period)    4h 50mn  400/800  118kg  2/2.5 bath  Hep none  TDC  Venofer 100 q HD until 4/17  Mircera 225 (last 4/1)  Sensipar 30 Calcitriol 2.25    Assessment/ Plan: 1. Osteomyelitis left foot/ ischemic changes right leg: Seen by vascular surgery with plans for angiography to help revascularize right lower extremity and staged subsequent amputation of toes with debridement of left foot. 2. LGIB: Prior history AVM's, seen by gastroenterology with colonoscopy done 5/4 w/ blood throughout the colon, unclear source. CTA abd was neg for source. Capsule endoscopy next per GI notes.  3. ESRD: HD TTS now w/ COVID+.  HD today, no heparin.  4. Anemia: Acute blood loss anemia superimposed on anemia of ESRD/chronic kidney disease.  Hemodynamically stable and received ESA with dialysis 5/1.  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 6. Nutrition: Continue renal diet with fluid restriction and nutritional supplementation to improve wound healing. 7. BP/volume: BP's on the soft side, getting metoprolol. Under dry wt, does not have any vol on board. Keep even next HD.      [name] [name] 05/07/2020, 3:51 PM   Recent Labs  Lab 05/02/20 0749 05/15/2020 0256  K 4.3 3.8  BUN 15 19  CREATININE 5.08* 6.48*  CALCIUM 7.7* 7.2*   PHOS 3.1 3.4  HGB 8.3* 7.0*   Inpatient medications: . sodium chloride   Intravenous Once  . acetaminophen  650 mg Oral Once  . allopurinol  100 mg Oral Daily  . atorvastatin  40 mg Oral q1800  . buPROPion  150 mg Oral BID  . calcitRIOL  2.25 mcg Oral Q T,Th,Sat-1800  . Chlorhexidine Gluconate Cloth  6 each Topical Q0600  . cinacalcet  30 mg Oral Q T,Th,Sat-1800  . darbepoetin (ARANESP) injection - DIALYSIS  150 mcg Intravenous Q Sat-HD  . diphenhydrAMINE  25 mg Intravenous Once  . feeding supplement  1 Container Oral TID BM  . gabapentin  300 mg Oral QHS  . linaclotide  72 mcg Oral QAC breakfast  . loratadine  10 mg Oral Daily  . metoprolol tartrate  25 mg Oral BID  . pantoprazole  40 mg Oral Daily  . peg 3350 powder  0.5 kit Oral Once  . polyethylene glycol  17 g Oral BID  . sodium chloride flush  3 mL Intravenous Q12H  . sucroferric oxyhydroxide  1,000 mg Oral TID WC   . albumin human    . sodium chloride    . sodium chloride    . sodium chloride    .  albumin human    . piperacillin-tazobactam (ZOSYN)  IV 2.25 g (05/20/2020 0549)   sodium chloride, sodium chloride, sodium chloride, acetaminophen, albuterol, alteplase, heparin, lidocaine (PF), lidocaine-prilocaine, metoprolol tartrate, mometasone-formoterol, nitroGLYCERIN, oxyCODONE, pentafluoroprop-tetrafluoroeth, sodium chloride flush

## 2020-05-03 NOTE — Plan of Care (Signed)

## 2020-05-03 NOTE — Progress Notes (Signed)
Pill ingested at 0830. instructions provided to patient, verbalized understanding. Information given to RN. Endo RN will get belt in AM.

## 2020-05-03 NOTE — Progress Notes (Signed)
PROGRESS NOTE                                                                                                                                                                                                             Patient Demographics:    [name] [name], is a 69 y.o. female, DOB - Oct 12, 1951, WVT:[id number]  Outpatient Primary MD for the patient is [place] date - 04/03/2020   LOS - 8  Chief Complaint  Patient presents with  . Altered Mental Status       Brief Narrative: Patient is a 69 y.o. female with PMHx of ESRD, PAD-s/p left great toe amputation on 04/11/2020, CAD s/p CABG, DM-2, HTN-who was transferred from her SNF for evaluation of acute metabolic encephalopathy in the setting of gangrenous changes to the right foot.  She was also incidentally found to have COVID-19.  Hospital course complicated by recurrent GI bleeding of obscure etiology-see below.  Significant Events: 4/6-4/22>> admit to [facility] for upper GI bleeding and blood loss anemia, left great toe osteo s/p amputation 4/28>> admit to [facility] for acute metabolic encephalopathy 3/83>> refused to sign consent for angiogram/amputation-rescheduled for 5/3 4/30>> bloody stools  5/2>> colonoscopy postponed-incomplete prep-due to refusal to complete bowel prep 5/3>> colonoscopy postponed-incomplete prep-due to refusal to complete bowel prep, lower extremity angiogram postponed-due to GI bleeding. 5/4>> colonoscopy  COVID-19 medications: Remdesivir: 4/28>> 4/29  Antibiotics: Vancomycin: 4/28>>5/1 Zosyn: 4/28>>  Microbiology data: 4/28: Blood culture>> no growth 4/28: Wound swab/superficial culture>> Pseudomonas  DVT prophylaxis: Hold Heparin 4/30 2/2 GI bleed  Procedures: 5/4>> colonoscopy-residual blood throughout: 5/4>> CT mesenteric angiogram: No active bleeding source 4/14>> left great toe amputation 4/10>>  EGD  Consults: VVS, renal,GI    Subjective:   Capsule endoscopy in process-she is not sure if she bled overnight-hemoglobin down to 7.0.  Asking for food again.   Assessment  & Plan :   Acute metabolic encephalopathy: Likely secondary to SIRS/right foot gangrene-uremia due to missed HD.  Significantly improved after treatment of SIRS/foot infection with gangrene and dialysis-now back to baseline.  SIRS (present on admission) with right lower extremity critical limb ischemia: Afebrile-SIRS physiology has improved-has gangrenous changes evident on exam-please see pictures taken on 4/28 and prior notes.  Blood cultures negative so far.    Superficial swab on admission  positive for Pseudomonas-not sure how accurate this is. No longer on vancomycin-continue Zosyn. Refused angiogram/amputation on 4/29-was rescheduled for 5/3-however with her ongoing GI bleeding-angiogram postponed for now. Very difficult situation-this patient has known history of AVMs and history of recurrent  GI bleeding requiring PRBC transfusion-and may not be able to tolerate long-term dual antiplatelet therapy.  History of PAD with recent left great toe amputation-now with nonhealing left foot wound: Vascular surgery planning debridement when she consents for procedure.  Remains on statin-aspirin held due to possible GI bleeding.  Recurrent upper GI bleeding-history of AVMs and acute blood loss anemia: Hemoglobin dropped down to 7.0-patient not sure if she bled overnight-no overt bleeding noted by RN.  However given concern for stuttering GI bleeding-we will go ahead and transfuse 1 unit of PRBC today.  GI continues to follow.   Underwent colonoscopy on 5/4-with blood throughout the colon-no obvious bleeding etiology found-CT mesenteric angiogram without any active bleeding.    Hyperkalemia: Secondary to missed HD-resolved with HD.  Follow periodically.  ESRD: Nephrology following and directing care.  Anemia:  Multifactorial-likely secondary to kidney disease-worsened by blood loss-we will transfuse another unit of PRBC today.  HTN: BP relatively well controlled-continue metoprolol  DM-2 with hypoglycemia (A1C 5.1 on 04/03/2020): No further hypoglycemic episodes-continue to hold all insulin for now-she is n.p.o. for colonoscopy.  Not a good candidate for tight glycemic control-allow some amount of permissive hyperglycemia.  Recent Labs    05/02/20 1647 05/02/20 2030 05/22/2020 0755  GLUCAP 137* 124* 73   Paroxysmal atrial fibrillation/atrial flutter: Not a new diagnosis-Per discharge summary in October 2020-she also had atrial fibrillation  as well.  Given recurrent GI bleeding/anemia-not a good candidate for anticoagulation.  Continue rate control with metoprolol.  Chronic combined diastolic and systolic heart failure (EF 45-50% by TTE on 04/28/2019): Volume status stable-diuresis with HD.  History of CVA: Continue statin-see above regarding aspirin and anticoagulation  Gout: Not in flare-continue allopurinol  Covid 19 Viral infection: Appears to be asymptomatic--on room air this morning.  Chest x-ray on admission did not show pneumonia.  Fever: afebrile  O2 requirements:  SpO2: 100 % O2 Flow Rate (L/min): 2 L/min   COVID-19 Labs: No results for input(s): DDIMER, FERRITIN, LDH, CRP in the last 72 hours.     Component Value Date/Time   BNP 897.8 (H) 01/01/2020 0600    No results for input(s): PROCALCITON in the last 168 hours.  Lab Results  Component Value Date   SARSCOV2NAA POSITIVE (A) 04/07/2020   [place] NEGATIVE 04/16/2020   [place] NEGATIVE 04/03/2020   [place] NEGATIVE 03/15/2020     Morbid Obesity: Estimated body mass index is 39.78 kg/m as calculated from the following:   Height as of this encounter: '5\' 7"'  (1.702 m).   Weight as of this encounter: 115.2 kg.     ABG:    Component Value Date/Time   PHART 7.419 02/05/2020 1114   PCO2ART 43.0 02/05/2020  1114   PO2ART 85.1 02/05/2020 1114   HCO3 27.2 02/05/2020 1114   TCO2 22 01/01/2020 0614   ACIDBASEDEF 6.0 (H) 01/01/2020 0614   O2SAT 95.3 02/05/2020 1114    Vent Settings: N/A    Condition - Guarded  Family Communication  : Aunt over the phone 5/6  Code Status :  Full Code  Diet :  Diet Order            Diet NPO time specified Except for: Sips with Meds  Diet effective ____  Disposition Plan  :  Status is: Inpatient  Remains inpatient appropriate because:Inpatient level of care appropriate due to severity of illness  Dispo: The patient is from: SNF              Anticipated d/c is to: SNF              Anticipated d/c date is: > 3 days              Patient currently is not medically stable to d/c.  Barriers to discharge: Ongoing GI bleeding without obvious source-right foot gangrene on IV antibiotics-probably will require revascularization/amputation once GI bleeding is controlled.  Antimicorbials  :    Anti-infectives (From admission, onward)   Start     Dose/Rate Route Frequency Ordered Stop   04/28/20 1200  vancomycin (VANCOCIN) IVPB 1000 mg/200 mL premix  Status:  Discontinued     1,000 mg 200 mL/hr over 60 Minutes Intravenous Every T-Th-Sa (Hemodialysis) 04/27/20 1154 04/28/20 1218   04/26/20 1400  piperacillin-tazobactam (ZOSYN) IVPB 2.25 g     2.25 g 100 mL/hr over 30 Minutes Intravenous Every 8 hours 04/26/20 1314     04/26/20 1000  remdesivir 100 mg in sodium chloride 0.9 % 100 mL IVPB  Status:  Discontinued     100 mg 200 mL/hr over 30 Minutes Intravenous Daily 04/16/2020 1952 04/26/20 1105   04/07/2020 2030  remdesivir 200 mg in sodium chloride 0.9% 250 mL IVPB     200 mg 580 mL/hr over 30 Minutes Intravenous Once 04/24/2020 1952 04/05/2020 2327   04/19/2020 1515  vancomycin (VANCOCIN) 2,500 mg in sodium chloride 0.9 % 500 mL IVPB     2,500 mg 250 mL/hr over 120 Minutes Intravenous  Once 04/09/2020 1513 04/26/2020 2026   03/29/2020 1513  vancomycin  variable dose per unstable renal function (pharmacist dosing)  Status:  Discontinued      Does not apply See admin instructions 04/03/2020 1514 04/28/20 1218   03/31/2020 1500  piperacillin-tazobactam (ZOSYN) IVPB 3.375 g     3.375 g 100 mL/hr over 30 Minutes Intravenous  Once 04/07/2020 1450 04/04/2020 2026      Inpatient Medications  Scheduled Meds: . allopurinol  100 mg Oral Daily  . atorvastatin  40 mg Oral q1800  . buPROPion  150 mg Oral BID  . calcitRIOL  2.25 mcg Oral Q T,Th,Sat-1800  . Chlorhexidine Gluconate Cloth  6 each Topical Q0600  . cinacalcet  30 mg Oral Q T,Th,Sat-1800  . darbepoetin (ARANESP) injection - DIALYSIS  150 mcg Intravenous Q Sat-HD  . feeding supplement  1 Container Oral TID BM  . gabapentin  300 mg Oral QHS  . linaclotide  72 mcg Oral QAC breakfast  . loratadine  10 mg Oral Daily  . metoprolol tartrate  25 mg Oral BID  . pantoprazole  40 mg Oral Daily  . peg 3350 powder  0.5 kit Oral Once  . polyethylene glycol  17 g Oral BID  . sodium chloride flush  3 mL Intravenous Q12H  . sucroferric oxyhydroxide  1,000 mg Oral TID WC   Continuous Infusions: . sodium chloride    . sodium chloride    . sodium chloride    . piperacillin-tazobactam (ZOSYN)  IV 2.25 g (05/06/2020 0549)   PRN Meds:.sodium chloride, sodium chloride, sodium chloride, acetaminophen, albuterol, alteplase, heparin, lidocaine (PF), lidocaine-prilocaine, metoprolol tartrate, mometasone-formoterol, nitroGLYCERIN, oxyCODONE, pentafluoroprop-tetrafluoroeth, sodium chloride flush   Time Spent in minutes 25   See all Orders from today for  further details   Oren Binet M.D on 04/28/2020 at 11:09 AM  To page go to [email or web address] - use universal password  [facility] -  Office  [phone]    Objective:   Vitals:   05/02/20 1900 05/14/2020 0330 05/14/2020 0757 05/28/2020 0831  BP: (!) 152/108 106/88 (!) 111/99   Pulse: 74  70   Resp: '19 15 19   ' Temp:  98.6 F (37 C) 98.5 F (36.9 C)    TempSrc:   Oral   SpO2: 100% 95% 100%   Weight:    115.2 kg  Height:    '5\' 7"'  (1.702 m)    Wt Readings from Last 3 Encounters:  05/14/2020 115.2 kg  04/19/20 118.4 kg  03/02/20 120.2 kg     Intake/Output Summary (Last 24 hours) at 05/02/2020 1109 Last data filed at 05/02/2020 0900 Gross per 24 hour  Intake 120 ml  Output --  Net 120 ml    Physical Exam Gen Exam:Alert awake-not in any distress HEENT:atraumatic, normocephalic Chest: B/L clear to auscultation anteriorly CVS:S1S2 regular Abdomen:soft non tender, non distended Extremities:trace edema Neurology: Non focal Skin: no rash   Data Review:    CBC Recent Labs  Lab 04/28/20 0719 04/28/20 0719 04/29/20 3875 05/25/2020 0500 05/09/2020 0501 05/02/20 0749 05/07/2020 0256  WBC 7.6   < > 7.2 7.0 7.5 9.3 6.6  HGB 7.7*   < > 8.1* 7.1* 8.0* 8.3* 7.0*  HCT 25.0*   < > 26.7* 23.3* 25.6* 26.7* 22.7*  PLT 313   < > 311 290 301 343 293  MCV 84.5   < > 84.5 85.0 87.7 87.8 87.6  MCH 26.0   < > 25.6* 25.9* 27.4 27.3 27.0  MCHC 30.8   < > 30.3 30.5 31.3 31.1 30.8  RDW 19.0*   < > 18.6* 18.9* 18.7* 18.7* 19.1*  LYMPHSABS 1.3  --   --   --   --   --   --   MONOABS 0.6  --   --   --   --   --   --   EOSABS 0.1  --   --   --   --   --   --   BASOSABS 0.0  --   --   --   --   --   --    < > = values in this interval not displayed.    Chemistries  Recent Labs  Lab 04/29/20 0632 05/12/2020 0500 05/28/2020 0501 05/02/20 0749 05/28/2020 0256  NA 140 140 144 138 137  K 3.9 3.5 3.6 4.3 3.8  CL 100 100 101 99 101  CO2 '25 25 23 25 25  ' GLUCOSE 97 106* 99 74 93  BUN 27* 30* 35* 15 19  CREATININE 5.84* 7.23* 8.65* 5.08* 6.48*  CALCIUM 7.6* 7.2* 7.3* 7.7* 7.2*   ------------------------------------------------------------------------------------------------------------------ No results for input(s): CHOL, HDL, LDLCALC, TRIG, CHOLHDL, LDLDIRECT in the last 72 hours.  Lab Results  Component Value Date   HGBA1C 5.1 04/03/2020    ------------------------------------------------------------------------------------------------------------------ No results for input(s): TSH, T4TOTAL, T3FREE, THYROIDAB in the last 72 hours.  Invalid input(s): FREET3 ------------------------------------------------------------------------------------------------------------------ No results for input(s): VITAMINB12, FOLATE, FERRITIN, TIBC, IRON, RETICCTPCT in the last 72 hours.  Coagulation profile No results for input(s): INR, PROTIME in the last 168 hours.  No results for input(s): DDIMER in the last 72 hours.  Cardiac Enzymes No results for input(s): CKMB, TROPONINI, MYOGLOBIN in the last 168 hours.  Invalid input(s): CK ------------------------------------------------------------------------------------------------------------------  Component Value Date/Time   BNP 897.8 (H) 01/01/2020 0600    Micro Results Recent Results (from the past 240 hour(s))  Culture, blood (Routine x 2)     Status: None   Collection Time: 04/01/2020  1:00 PM   Specimen: BLOOD  Result Value Ref Range Status   Specimen Description BLOOD RIGHT ANTECUBITAL  Final   Special Requests   Final    BOTTLES DRAWN AEROBIC AND ANAEROBIC Blood Culture results may not be optimal due to an inadequate volume of blood received in culture bottles   Culture   Final    NO GROWTH 5 DAYS Performed at [facility], [place] [address], [place], [place] [address]    Report Status 05/24/2020 FINAL  Final  Culture, blood (Routine x 2)     Status: None   Collection Time: 04/24/2020  1:20 PM   Specimen: BLOOD RIGHT HAND  Result Value Ref Range Status   Specimen Description BLOOD RIGHT HAND  Final   Special Requests   Final    BOTTLES DRAWN AEROBIC ONLY Blood Culture results may not be optimal due to an inadequate volume of blood received in culture bottles   Culture   Final    NO GROWTH 5 DAYS Performed at [facility], [place] [address] [place], [place]  [address]    Report Status 05/04/2020 FINAL  Final  Respiratory Panel by RT PCR (Flu A&B, Covid) - Nasopharyngeal Swab     Status: Abnormal   Collection Time: 04/10/2020  3:58 PM   Specimen: Nasopharyngeal Swab  Result Value Ref Range Status   SARS Coronavirus 2 by RT PCR POSITIVE (A) NEGATIVE Final    Comment: RESULT CALLED TO, READ BACK BY AND VERIFIED WITH: [facility] RN 04/08/2020 1751 JDW (NOTE) SARS-CoV-2 target nucleic acids are DETECTED. SARS-CoV-2 RNA is generally detectable in upper respiratory specimens  during the acute phase of infection. Positive results are indicative of the presence of the identified virus, but do not rule out bacterial infection or co-infection with other pathogens not detected by the test. Clinical correlation with patient history and other diagnostic information is necessary to determine patient infection status. The expected result is Negative. Fact Sheet for Patients:  PinkCheek.be Fact Sheet for Healthcare Providers: GravelBags.it This test is not yet approved or cleared by the [place] FDA and  has been authorized for detection and/or diagnosis of SARS-CoV-2 by FDA under an Emergency Use Authorization (EUA).  This EUA will remain in effect (meaning this test can be used) for th e duration of  the COVID-19 declaration under Section 564(b)(1) of the Act, 21 U.S.C. section 360bbb-3(b)(1), unless the authorization is terminated or revoked sooner.    Influenza A by PCR NEGATIVE NEGATIVE Final   Influenza B by PCR NEGATIVE NEGATIVE Final    Comment: (NOTE) The Xpert Xpress SARS-CoV-2/FLU/RSV assay is intended as an aid in  the diagnosis of influenza from Nasopharyngeal swab specimens and  should not be used as a sole basis for treatment. Nasal washings and  aspirates are unacceptable for Xpert Xpress SARS-CoV-2/FLU/RSV  testing. Fact Sheet for  Patients: PinkCheek.be Fact Sheet for Healthcare Providers: GravelBags.it This test is not yet approved or cleared by the [place] FDA and  has been authorized for detection and/or diagnosis of SARS-CoV-2 by  FDA under an Emergency Use Authorization (EUA). This EUA will remain  in effect (meaning this test can be used) for the duration of the  Covid-19 declaration under Section 564(b)(1)  of the Act, 21  U.S.C. section 360bbb-3(b)(1), unless the authorization is  terminated or revoked. Performed at [facility], [place] [address] [place], [place] [address]   Wound or Superficial Culture     Status: None   Collection Time: 03/30/2020  8:43 PM   Specimen: Toe; Wound  Result Value Ref Range Status   Specimen Description TOE  Final   Special Requests RIGHT  Final   Gram Stain   Final    NO WBC SEEN NO ORGANISMS SEEN Performed at [facility], [address] [place], [place] [address]    Culture RARE PSEUDOMONAS AERUGINOSA  Final   Report Status 04/28/2020 FINAL  Final   Organism ID, Bacteria PSEUDOMONAS AERUGINOSA  Final      Susceptibility   Pseudomonas aeruginosa - MIC*    CEFTAZIDIME 4 SENSITIVE Sensitive     CIPROFLOXACIN <=0.25 SENSITIVE Sensitive     GENTAMICIN 4 SENSITIVE Sensitive     IMIPENEM 2 SENSITIVE Sensitive     PIP/TAZO 8 SENSITIVE Sensitive     CEFEPIME 4 SENSITIVE Sensitive     * RARE PSEUDOMONAS AERUGINOSA    Radiology Reports DG Chest 2 View  Result Date: 04/22/2020 CLINICAL DATA:  69 year old female with suspected sepsis. Bilateral foot infection for the past 3 weeks. EXAM: CHEST - 2 VIEW COMPARISON:  Chest x-ray 04/04/2020. FINDINGS: Lung volumes are normal. No consolidative airspace disease. No pleural effusions. No evidence of frank pulmonary edema. Mild cephalization of the pulmonary vasculature. Mild cardiomegaly. Upper mediastinal contours are within normal limits. Aortic  atherosclerosis. Status post median sternotomy for CABG. Right internal jugular PermCath with tip terminating in the mid to distal superior vena cava. Aortic atherosclerosis. IMPRESSION: 1. Postoperative changes and support apparatus, as above. 2. Mild cardiomegaly with pulmonary venous congestion, without frank pulmonary edema. 3. Aortic atherosclerosis. Electronically Signed   By: Vinnie Langton M.D.   On: 04/18/2020 14:52   CT FOOT RIGHT WO CONTRAST  Result Date: 04/12/2020 CLINICAL DATA:  Pain and swelling. EXAM: CT OF THE RIGHT FOOT WITHOUT CONTRAST TECHNIQUE: Multidetector CT imaging of the right foot was performed according to the standard protocol. Multiplanar CT image reconstructions were also generated. COMPARISON:  Radiographs 04/09/2020 FINDINGS: Diffuse subcutaneous soft tissue swelling/edema/fluid involving the entire ankle and foot. No focal fluid collection to suggest a drainable soft tissue abscess. Marked fatty atrophy of the foot and ankle musculature. Suspect myofasciitis involving the plantar foot musculature but no definite findings for pyomyositis. Suspect an open wound along the plantar aspect of the great toe but I do not see any obvious destructive bony changes to suggest osteomyelitis. No definite findings for septic arthritis. The other bony structures of the foot and ankle appear intact. No obvious destructive bony changes, fractures or osteochondral lesions. Extensive small artery calcifications. IMPRESSION: 1. Diffuse subcutaneous soft tissue swelling/edema/fluid involving the entire ankle and foot consistent with cellulitis. No focal fluid collection to suggest a drainable soft tissue abscess. 2. Suspect myofasciitis involving the plantar foot musculature but no definite findings for pyomyositis. Severe diffuse fatty atrophy of the foot and ankle musculature. 3. Suspect an open wound along the plantar aspect of the great toe but I do not see any obvious destructive bony changes  to suggest osteomyelitis. No definite findings for septic arthritis. 4. If symptoms persist or worsen MRI is suggested for further evaluation. Electronically Signed   By: Marijo Sanes M.D.   On: 04/12/2020 10:51   DG CHEST PORT 1 VIEW  Result Date: 04/04/2020 CLINICAL DATA:  69 year old female with fever. EXAM: PORTABLE CHEST 1 VIEW COMPARISON:  Chest radiograph dated 02/24/2020. FINDINGS: Right-sided dialysis catheter in similar position. There is cardiomegaly with mild vascular congestion. No focal consolidation, pleural effusion or pneumothorax. Median sternotomy wires and CABG vascular clips. No acute osseous pathology. IMPRESSION: Cardiomegaly with mild vascular congestion. No focal consolidation. Electronically Signed   By: Anner Crete M.D.   On: 04/04/2020 17:29   DG ABD ACUTE 2+V W 1V CHEST  Result Date: 04/09/2020 CLINICAL DATA:  Fever with low hemoglobin. EXAM: DG ABDOMEN ACUTE W/ 1V CHEST COMPARISON:  Abdominal film 06/26/2018 and chest x-ray 02/24/2020 FINDINGS: Right IJ central venous catheter unchanged with tip over the SVC. Lungs are adequately inflated with minimal hazy prominence of the perihilar vessels unchanged and likely due to mild chronic vascular congestion. No lobar consolidation or effusion. Mild stable cardiomegaly. Remainder the chest is unchanged. There are several air-filled large and small bowel loops. A few air-filled small bowel loops at the upper limits of normal in diameter over the left abdomen. No evidence of air-fluid levels or free peritoneal air. Remainder of the exam is unchanged. IMPRESSION: Nonspecific, nonobstructive bowel gas pattern with a few air-filled mildly prominent, but nondilated small bowel loops in the left abdomen. Stable mild cardiomegaly with suggestion of minimal chronic vascular congestion. Electronically Signed   By: Marin Olp M.D.   On: 04/09/2020 09:57   DG Foot Complete Left  Result Date: 04/18/2020 CLINICAL DATA:  Recent  amputation. Suspect sepsis. Bilateral foot infection 3 weeks. EXAM: LEFT FOOT - COMPLETE 3+ VIEW COMPARISON:  04/09/2020 FINDINGS: There is generalized decreased bone mineralization. Evidence of patient's recent amputation distal to the distal shaft of the first metatarsal. Expected postsurgical changes at the amputation site. No focal bone destruction to suggest osteomyelitis. No air within the soft tissues. Exam is otherwise unchanged. IMPRESSION: Expected changes post amputation distal to the distal aspect of the first metatarsal. No definite bone destruction to suggest osteomyelitis. No air within the soft tissues. Electronically Signed   By: Marin Olp M.D.   On: 03/30/2020 14:58   DG Foot Complete Left  Result Date: 04/09/2020 CLINICAL DATA:  Foot pain and swelling EXAM: LEFT FOOT - COMPLETE 3+ VIEW COMPARISON:  02/01/2020 MRI, 01/31/2020 plain film FINDINGS: There is been interval amputation of the first toe. Persistent soft tissue wound is seen. There is lucency identified in the head of the first metatarsal suspicious for recurrent osteomyelitis. No other fracture or dislocation is seen. No other soft tissue abnormality is noted. IMPRESSION: Changes suspicious for osteomyelitis in the head of the first metatarsal. Overlying soft tissue wound is noted. Electronically Signed   By: Inez Catalina M.D.   On: 04/09/2020 19:40   DG Foot Complete Right  Result Date: 04/22/2020 CLINICAL DATA:  Recent amputation with possible infection three weeks. Suspect sepsis. EXAM: RIGHT FOOT COMPLETE - 3+ VIEW COMPARISON:  CT 04/12/2020 FINDINGS: Diffuse decreased bone mineralization. Degenerative change over the first MTP joint and interphalangeal joints. Mild horizontal sclerosis with subtle lucency involving the head of the fifth metatarsal possibly subacute fracture. No focal bone destruction to suggest osteomyelitis. No air within the soft tissues. IMPRESSION: 1. No definite evidence of osteomyelitis and no air  within the soft tissues. 2. Linear sclerosis with subtle lucency involving the head of the fifth metatarsal which may be due to nondisplaced subacute fracture. Electronically Signed   By: Marin Olp M.D.   On: 04/26/2020 14:56  VAS [place] LOWER EXTREMITY VENOUS (DVT)  Result Date: 04/10/2020  Lower Venous DVTStudy Indications: Edema, and fever.  Limitations: Body habitus and poor ultrasound/tissue interface. Comparison Study: 04/28/19 previous Performing Technologist: Abram Sander RVS  Examination Guidelines: A complete evaluation includes B-mode imaging, spectral Doppler, color Doppler, and power Doppler as needed of all accessible portions of each vessel. Bilateral testing is considered an integral part of a complete examination. Limited examinations for reoccurring indications may be performed as noted. The reflux portion of the exam is performed with the patient in reverse Trendelenburg.  +---------+---------------+---------+-----------+----------+--------------+ RIGHT    CompressibilityPhasicitySpontaneityPropertiesThrombus Aging +---------+---------------+---------+-----------+----------+--------------+ CFV      Full           Yes      Yes                                 +---------+---------------+---------+-----------+----------+--------------+ SFJ      Full                                                        +---------+---------------+---------+-----------+----------+--------------+ FV Prox  Full                                                        +---------+---------------+---------+-----------+----------+--------------+ FV Mid   Full                                                        +---------+---------------+---------+-----------+----------+--------------+ FV DistalFull                                                        +---------+---------------+---------+-----------+----------+--------------+ PFV      Full                                                         +---------+---------------+---------+-----------+----------+--------------+ POP      Full           Yes      Yes                                 +---------+---------------+---------+-----------+----------+--------------+ PTV      Full                                                        +---------+---------------+---------+-----------+----------+--------------+ PERO  Not visualized +---------+---------------+---------+-----------+----------+--------------+   +---------+---------------+---------+-----------+----------+--------------+ LEFT     CompressibilityPhasicitySpontaneityPropertiesThrombus Aging +---------+---------------+---------+-----------+----------+--------------+ CFV      Full           Yes      Yes                                 +---------+---------------+---------+-----------+----------+--------------+ SFJ      Full                                                        +---------+---------------+---------+-----------+----------+--------------+ FV Prox  Full                                                        +---------+---------------+---------+-----------+----------+--------------+ FV Mid   Full                                                        +---------+---------------+---------+-----------+----------+--------------+ FV DistalFull                                                        +---------+---------------+---------+-----------+----------+--------------+ PFV      Full                                                        +---------+---------------+---------+-----------+----------+--------------+ POP      Full           Yes      Yes                                 +---------+---------------+---------+-----------+----------+--------------+ PTV      Full                                                         +---------+---------------+---------+-----------+----------+--------------+ PERO                                                  Not visualized +---------+---------------+---------+-----------+----------+--------------+     Summary: BILATERAL: - No evidence of deep vein thrombosis seen in the lower extremities, bilaterally.   *See table(s) above for measurements and observations. Electronically signed by Deitra Mayo MD on 04/10/2020 at 3:24:49 PM.    Final    CT Angio Abd/Pel w/  and/or w/o  Result Date: 05/02/2020 CLINICAL DATA:  69 year old female currently COVID-19 positive with bleeding per rectum concerning for lower GI bleed. EXAM: CTA ABDOMEN AND PELVIS WITHOUT AND WITH CONTRAST TECHNIQUE: Multidetector CT imaging of the abdomen and pelvis was performed using the standard protocol during bolus administration of intravenous contrast. Multiplanar reconstructed images and MIPs were obtained and reviewed to evaluate the vascular anatomy. CONTRAST:  150m OMNIPAQUE IOHEXOL 350 MG/ML SOLN COMPARISON:  Prior CT scan of the abdomen and pelvis 08/16/2019 FINDINGS: VASCULAR Aorta: Extensive heterogeneous mixed calcified and fibrofatty atherosclerotic plaque throughout the abdominal aorta. The infrarenal aorta is mildly aneurysmal at a maximum of 3 cm in diameter. Celiac: Calcified atherosclerotic plaque at the origin without significant stenosis. No aneurysm or dissection. SMA: Calcified atherosclerotic plaque results in at least moderate stenosis of the proximal SMA. Fibrofatty atherosclerotic plaque results in focal moderate stenosis in the mid SMA. Renals: There are 2 renal arteries bilaterally. All for renal arteries are heavily diseased with mixed calcified and fibrofatty atherosclerotic plaque. At least moderate stenoses present bilaterally worse on the left than the right. IMA: Heavily diseased with high-grade stenosis at the origin. The vessel opacifies distally. Inflow: Mixed calcified and  fibrofatty atherosclerotic plaque extends into the inflow vessels bilaterally. Both common iliac arteries are ectatic measuring up to 1.8 cm on the left and 2.1 cm on the right. Focal moderate stenoses at the origins of the internal iliac arteries. 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 No significant stenosis or occlusion of the external iliac arteries. Proximal Outflow: Heavily diseased bilaterally. Moderate to high-grade stenosis of the right common femoral artery. Bilateral profunda femoral arteries are heavily diseased bilaterally. High-grade stenosis bordering on occlusion of the proximal right superficial femoral artery. Veins: No focal venous abnormality. Review of the MIP images confirms the above findings. NON-VASCULAR Lower chest: The visualized cardiac structures are normal in size. No pericardial effusion. Moderately large hiatal hernia. The lower lungs are clear. Hepatobiliary: Normal hepatic contour and morphology. Geographic hypoattenuation in the left hemi-liver adjacent to the fissure for the falciform ligament is nonspecific but most suggestive of benign focal fatty infiltration. No discrete solid hepatic lesion. Gallbladder is unremarkable. No intra or extrahepatic biliary ductal dilatation. Pancreas: Unremarkable. No pancreatic ductal dilatation or surrounding inflammatory changes. Spleen: 0.9 cm circumscribed low-attenuation lesion in the posteromedial aspect of the spleen is too small for accurate characterization. Statistically, this is almost certainly a benign lesion such as a cyst. Adrenals/Urinary Tract: Normal adrenal glands. Renal atrophy bilaterally worse on the left than the right likely secondary to chronic ischemia related to renal artery stenoses. 1.0 cm low-attenuation lesion in the right interpolar kidney is too small for accurate characterization but remains unchanged compared to prior studies and is almost certainly benign. Right lower pole  renal sinus cyst, stable. Air-fluid level present within the bladder. Additionally, there is a semi lunar collection of fluid surrounding the urethra. Stomach/Bowel: No evidence of contrast extravasation into bowel on either the arterial or portal venous phase. Negative for acute bleeding at this time. No significant diverticular disease. Stomach and duodenum are unremarkable. No evidence of bowel wall thickening or obstruction. A metallic clip is present in the ascending colon. Lymphatic: No suspicious lymphadenopathy. Reproductive: Dystrophic calcified uterine fibroid. No adnexal masses. Other: No significant abdominal wall hernia. No evidence of ascites. Musculoskeletal: No acute fracture or aggressive appearing lytic or blastic osseous lesion. Lower lumbar degenerative disc disease most notable at L5-S1. IMPRESSION: VASCULAR 1.  No evidence of active GI bleeding. 2. 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 Of note, the pattern of disease is not suggestive of chronic mesenteric ischemia. 3. 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 4. 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 5. 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 Recommend followup by ultrasound in 3 years. This recommendation follows ACR consensus guidelines: White Paper of the ACR Incidental Findings Committee II on Vascular Findings. J Am Coll Radiol 2013; 10:789-794. Aortic aneurysm NOS (ICD10-I71.9); Aortic Atherosclerosis (ICD10-I70.0). NON-VASCULAR 1. No evidence of bowel wall thickening, inflammation or significant diverticulosis. 2. Metallic clip visible in the ascending colon. 3. Bilateral renal atrophy, likely secondary to chronic ischemia related renal artery stenoses. 4. Air-fluid level in the bladder is presumably related to recent instrumentation. If the  patient has not been recently catheterized, then cystitis with a gas-forming organism would be a consideration. 5. Urethrocele. 6. Moderate hiatal hernia. 7. Lower lumbar degenerative disc disease. Electronically Signed   By: Jacqulynn Cadet M.D.   On: 05/02/2020 08:47

## 2020-05-03 NOTE — Progress Notes (Signed)
Physical Therapy Evaluation  Confirmed with Dr. Sloan Leiter patient's activity orders and that patient is cleared to participate in physical therapy evaluation. Confirmed via telephone with vascular MD, Dr. Donzetta Matters, patient is WBAT BLEs. Patient presents with decreased overall strength and impaired activity tolerance. She required maxA x 2 to roll in bed. Patient declined attempting to sit EOB. Assisted with repositioning to decrease risk of further skin breakdown and for improved cardiopulmonary status. Recommend continued skilled PT services and for patient to discharge back to SNF for short term rehabilitation.     05/06/2020 0903  PT Visit Information  Last PT Received On 05/11/2020  Assistance Needed +2  PT/OT/SLP Co-Evaluation/Treatment Yes  Reason for Co-Treatment Complexity of the patient's impairments (multi-system involvement);Necessary to address cognition/behavior during functional activity;For patient/therapist safety  History of Present Illness 69 year old female admitted 04/24/2020 from SNF due to acute metabolic encephalopathy with gangrenous changes to right foot. Patient also found to be COVID+. Hospitalization complicated by GI bleeding. Patient with recent hospitalization 04/03/20-04/19/20 and L great toe amputation (conflicting dates provided 02/07/20 and 04/11/20). Vascular surgery, GI, and nephrology following. Colonoscopy showed residual blood throughout. CT mesenteric angiogram: no active bleeding source. Patient to undergo capsule endoscopy and pending results will need angiography RLE and probably R transmetatarsal amputation and debridement L foot. Per vascular, patient is at high risk for BLE amputation. Confirmed with Dr. Donzetta Matters on 05/08/2020, patient is WBAT BLEs. PMH: ESRD, PAD-s/p left great toe amputation, CAD s/p CABG, DM2, HTN  Precautions  Precautions Fall  Precaution Comments confirmed with vascular MD, WBAT BLEs  Restrictions  Weight Bearing Restrictions Yes  RLE Weight Bearing WBAT   LLE Weight Bearing WBAT  Home Living  Family/patient expects to be discharged to: Skilled nursing facility  Additional Comments Patient has been in/out of hospital/rehab it appears since Feb 2021. Patient admitted to [facility] from [facility].   Prior Function  Comments Patient unable to provide PLOF.  Pain Assessment  Pain Assessment Faces  Faces Pain Scale 4  Pain Location discomfort with repositioning  Pain Intervention(s) Monitored during session;Limited activity within patient's tolerance;Repositioned  Cognition  Arousal/Alertness Lethargic  Behavior During Therapy Flat affect  Overall Cognitive Status No family/caregiver present to determine baseline cognitive functioning  Upper Extremity Assessment  Upper Extremity Assessment Defer to OT evaluation  Lower Extremity Assessment  Lower Extremity Assessment Generalized weakness;RLE deficits/detail;LLE deficits/detail  RLE Deficits / Details grossly 2-/5  LLE Deficits / Details grossly 1 to 2-/5  Bed Mobility  Overal bed mobility Needs Assistance  Bed Mobility Rolling  Rolling Max assist;+2 for physical assistance;+2 for safety/equipment  General bed mobility comments Patient assisted with use of bedrail. Rolling bilat each direction once for assistance with care/hygiene after incontinence.  Transfers  General transfer comment Patient declined further mobility such as EOB. Patient would be hoyer lift to chair at this time.  Balance  Sitting balance - Comments unable to assess  General Comments  General comments (skin integrity, edema, etc.) Nurse cleared patient to participate in PT evaluation, cleared to participate even though has capsule study on.   PT - End of Session  Activity Tolerance Patient limited by fatigue  Patient left in bed;with call bell/phone within reach;with bed alarm set  Nurse Communication Mobility status  PT Assessment  PT Recommendation/Assessment Patient needs continued PT services  PT Visit Diagnosis  Pain;Muscle weakness (generalized) (M62.81);Other abnormalities of gait and mobility (R26.89)  PT Problem List Decreased strength;Decreased activity tolerance;Decreased balance;Decreased mobility;Decreased cognition;Pain  PT Plan  PT Frequency (ACUTE ONLY) Min 2X/week  PT Treatment/Interventions (ACUTE ONLY) DME instruction;Gait training;Stair training;Functional mobility training;Therapeutic activities;Therapeutic exercise;Balance training;Cognitive remediation;Patient/family education  AM-PAC PT "6 Clicks" Mobility Outcome Measure (Version 2)  Help needed turning from your back to your side while in a flat bed without using bedrails? 2  Help needed moving from lying on your back to sitting on the side of a flat bed without using bedrails? 2  Help needed moving to and from a bed to a chair (including a wheelchair)? 1  Help needed standing up from a chair using your arms (e.g., wheelchair or bedside chair)? 1  Help needed to walk in hospital room? 1  Help needed climbing 3-5 steps with a railing?  1  6 Click Score 8  Consider Recommendation of Discharge To: CIR/SNF/LTACH  PT Recommendation  Follow Up Recommendations SNF  PT equipment Other (comment) (TBD upon further mobility assessment)  Acute Rehab PT Goals  Patient Stated Goal to be left alone  PT Goal Formulation With patient  Time For Goal Achievement 05/16/20  Potential to Achieve Goals Fair  PT Time Calculation  PT Start Time (ACUTE ONLY) 0903  PT Stop Time (ACUTE ONLY) 0934  PT Time Calculation (min) (ACUTE ONLY) 31 min  PT General Charges  $$ ACUTE PT VISIT 1 Visit  PT Evaluation  $PT Eval Moderate Complexity 1 Mod   Birdie Hopes, PT, DPT Acute Rehab [phone] office

## 2020-05-03 NOTE — Progress Notes (Signed)
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 S/p L great toe amputation revision on 4/14. Increased R foot swelling 4/15, CT showed diffuse cellulitis, no evidence of osteomyelitis. PMH includes ESRD on HD, CAD with hx of CABG, PVD with left great toe amputation 02/07/20, DM2, obesity, HF, stroke, obesity, thrombocytopenia.Covid +   Clinical Impression   Evaluation limited by lethargy. Pt not agreeable to sitting EOB. Pt incontinent during session and required Max A +2 to roll in bed and complete pericare. Let in semi -chair position. Eyes closed majority of session. VSS. Pt answering some questions, stating she is from [place] originally and was a [employer]arry manager. Plan is to DC back to SNF for further rehab. Will follow acutely.     Follow Up Recommendations  SNF;Supervision/Assistance - 24 hour    Equipment Recommendations  Other (comment)(TBA at SNF)    Recommendations for Other Services       Precautions / Restrictions Precautions Precautions: Fall Precaution Comments: left great toe amputation; reports no WB restrictions; at risk for skin breakdown Restrictions Weight Bearing Restrictions: Yes LLE Weight Bearing: Weight bearing as tolerated      Mobility Bed Mobility Overal bed mobility: Needs Assistance Bed Mobility: Rolling Rolling: Max assist;+2 for physical assistance         General bed mobility comments: heavy use of bed pad; pt did grab rail and help to hold self in sidelying while cleaning from incontinence  Transfers                 General transfer comment: Pt declined to progress to EOB    Balance     Sitting balance-Leahy Scale: Poor Sitting balance - Comments: leaning R in bed                                   ADL  either performed or assessed with clinical judgement   ADL Overall ADL's : Needs assistance/impaired   Eating/Feeding Details (indicate cue type and reason): too lethargic to assess Grooming: Moderate assistance;Bed level   Upper Body Bathing: Maximal assistance;Bed level   Lower Body Bathing: Total assistance;Bed level   Upper Body Dressing : Maximal assistance;Bed level   Lower Body Dressing: Total assistance;Bed level       Toileting- Clothing Manipulation and Hygiene: Total assistance;+2 for physical assistance Toileting - Clothing Manipulation Details (indicate cue type and reason): Pt incontinent - required total A to clean; unaware of being incontinent     Functional mobility during ADLs: (pt declined)       Vision   Additional Comments: eyes closed majority of session     Perception     Praxis      Pertinent Vitals/Pain Pain Assessment: Faces Faces Pain Scale: Hurts little more Pain Location: general discomfort Pain Descriptors / Indicators: Discomfort;Grimacing Pain Intervention(s): Limited activity within patient's tolerance;Repositioned     Hand Dominance Right   Extremity/Trunk Assessment Upper Extremity Assessment Upper Extremity Assessment: Generalized weakness   Lower Extremity Assessment Lower Extremity Assessment: Defer to PT evaluation   Cervical / Trunk Assessment Cervical / Trunk Exceptions: Increased body habitus   Communication Communication Communication: No difficulties   Cognition Arousal/Alertness: Lethargic Behavior During Therapy: Flat affect Overall Cognitive Status: Impaired/Different from  baseline Area of Impairment: Orientation;Attention;Memory;Following commands;Safety/judgement;Awareness;Problem solving                 Orientation Level: Disoriented to;Time;Situation;Place Current Attention Level: Focused Memory: Decreased recall of precautions;Decreased short-term memory Following Commands: Follows one step  commands with increased time Safety/Judgement: Decreased awareness of safety;Decreased awareness of deficits Awareness: Intellectual Problem Solving: Slow processing;Decreased initiation     General Comments       Exercises Exercises: General Upper Extremity General Exercises - Upper Extremity Shoulder Flexion: AROM;Both;5 reps;Supine   Shoulder Instructions      Home Living Family/patient expects to be discharged to:: Skilled nursing facility                                        Prior Functioning/Environment Level of Independence: Needs assistance  Gait / Transfers Assistance Needed: Reports she was doing minimal ambulation at [facility] since toe amputation. ADL's / Homemaking Assistance Needed: Needing assist with ADLs.   Comments: Pt unable to give POLF at SNF        OT Problem List: Decreased strength;Decreased range of motion;Decreased activity tolerance;Impaired balance (sitting and/or standing);Decreased cognition;Decreased safety awareness;Decreased knowledge of use of DME or AE;Cardiopulmonary status limiting activity;Obesity;Impaired UE functional use;Pain;Increased edema      OT Treatment/Interventions: Self-care/ADL training;Therapeutic exercise;Neuromuscular education;Energy conservation;DME and/or AE instruction;Therapeutic activities;Cognitive remediation/compensation;Patient/family education;Balance training    OT Goals(Current goals can be found in the care plan section) Acute Rehab OT Goals Patient Stated Goal: to be left alone OT Goal Formulation: Patient unable to participate in goal setting Time For Goal Achievement: 05/17/20 Potential to Achieve Goals: Fair  OT Frequency: Min 2X/week   Barriers to D/C:            Co-evaluation PT/OT/SLP Co-Evaluation/Treatment: Yes Reason for Co-Treatment: Complexity of the patient's impairments (multi-system involvement);Necessary to address cognition/behavior during functional  activity;For patient/therapist safety   OT goals addressed during session: ADL's and self-care      AM-PAC OT "6 Clicks" Daily Activity     Outcome Measure Help from another person eating meals?: A Lot Help from another person taking care of personal grooming?: A Lot Help from another person toileting, which includes using toliet, bedpan, or urinal?: Total Help from another person bathing (including washing, rinsing, drying)?: A Lot Help from another person to put on and taking off regular upper body clothing?: A Lot Help from another person to put on and taking off regular lower body clothing?: Total 6 Click Score: 10   End of Session Nurse Communication: Mobility status  Activity Tolerance: Patient limited by fatigue;Patient limited by lethargy Patient left: in bed;with call bell/phone within reach;with bed alarm set(modified chair position)  OT Visit Diagnosis: Other abnormalities of gait and mobility (R26.89);Muscle weakness (generalized) (M62.81);Other symptoms and signs involving cognitive function;Pain Pain - part of body: (general discomfort)                Time: 3299-2426 OT Time Calculation (min): 20 min Charges:  OT General Charges $OT Visit: 1 Visit OT Evaluation $OT Eval Moderate Complexity: [place], OT/L   Acute OT Clinical Specialist Acute Rehabilitation Services Pager [phone] Office [phone]   [facility] 05/18/2020, 1:36 PM

## 2020-05-04 ENCOUNTER — Inpatient Hospital Stay (HOSPITAL_COMMUNITY): Payer: Medicare (Managed Care)

## 2020-05-04 ENCOUNTER — Encounter: Payer: Medicare (Managed Care) | Admitting: Vascular Surgery

## 2020-05-04 DIAGNOSIS — R195 Other fecal abnormalities: Secondary | ICD-10-CM

## 2020-05-04 LAB — CBC
HCT: 24.1 % — ABNORMAL LOW (ref 36.0–46.0)
HCT: 25.2 % — ABNORMAL LOW (ref 36.0–46.0)
Hemoglobin: 7.3 g/dL — ABNORMAL LOW (ref 12.0–15.0)
Hemoglobin: 7.4 g/dL — ABNORMAL LOW (ref 12.0–15.0)
MCH: 26.8 pg (ref 26.0–34.0)
MCH: 26.7 pg (ref 26.0–34.0)
MCHC: 30.3 g/dL (ref 30.0–36.0)
MCHC: 29.4 g/dL — ABNORMAL LOW (ref 30.0–36.0)
MCV: 88.6 fL (ref 80.0–100.0)
MCV: 91 fL (ref 80.0–100.0)
Platelets: 269 10*3/uL (ref 150–400)
Platelets: 287 10*3/uL (ref 150–400)
RBC: 2.72 MIL/uL — ABNORMAL LOW (ref 3.87–5.11)
RBC: 2.77 MIL/uL — ABNORMAL LOW (ref 3.87–5.11)
RDW: 19 % — ABNORMAL HIGH (ref 11.5–15.5)
RDW: 19.1 % — ABNORMAL HIGH (ref 11.5–15.5)
WBC: 6.2 10*3/uL (ref 4.0–10.5)
WBC: 7.4 10*3/uL (ref 4.0–10.5)
nRBC: 3.9 % — ABNORMAL HIGH (ref 0.0–0.2)
nRBC: 6.8 % — ABNORMAL HIGH (ref 0.0–0.2)

## 2020-05-04 LAB — RENAL FUNCTION PANEL
Albumin: 2.2 g/dL — ABNORMAL LOW (ref 3.5–5.0)
Anion gap: 14 (ref 5–15)
BUN: 8 mg/dL (ref 8–23)
CO2: 25 mmol/L (ref 22–32)
Calcium: 7.4 mg/dL — ABNORMAL LOW (ref 8.9–10.3)
Chloride: 100 mmol/L (ref 98–111)
Creatinine, Ser: 4.1 mg/dL — ABNORMAL HIGH (ref 0.44–1.00)
GFR calc Af Amer: 12 mL/min — ABNORMAL LOW (ref >60)
GFR calc non Af Amer: 10 mL/min — ABNORMAL LOW (ref >60)
Glucose, Bld: 125 mg/dL — ABNORMAL HIGH (ref 70–99)
Phosphorus: 2.4 mg/dL — ABNORMAL LOW (ref 2.5–4.6)
Potassium: 4 mmol/L (ref 3.5–5.1)
Sodium: 139 mmol/L (ref 135–145)

## 2020-05-04 LAB — GLUCOSE, CAPILLARY
Glucose-Capillary: 128 mg/dL — ABNORMAL HIGH (ref 70–99)
Glucose-Capillary: 209 mg/dL — ABNORMAL HIGH (ref 70–99)
Glucose-Capillary: 201 mg/dL — ABNORMAL HIGH (ref 70–99)
Glucose-Capillary: 197 mg/dL — ABNORMAL HIGH (ref 70–99)

## 2020-05-04 MED ORDER — PEG-KCL-NACL-NASULF-NA ASC-C 100 G PO SOLR
0.5000 | Freq: Once | ORAL | Status: AC
  Administered 2020-05-04: 100 g via ORAL
  Filled 2020-05-04: qty 1

## 2020-05-04 MED ORDER — IOHEXOL 350 MG/ML SOLN
100.0000 mL | Freq: Once | INTRAVENOUS | Status: AC | PRN
  Administered 2020-05-04: 100 mL via INTRAVENOUS

## 2020-05-04 MED ORDER — CHLORHEXIDINE GLUCONATE CLOTH 2 % EX PADS: 6.0000 | MEDICATED_PAD | Freq: Every day | CUTANEOUS | Status: DC

## 2020-05-04 MED ORDER — BISACODYL 5 MG PO TBEC: 10.0000 mg | DELAYED_RELEASE_TABLET | Freq: Every day | ORAL | Status: DC | PRN

## 2020-05-04 NOTE — Progress Notes (Signed)
Per [facility], patient will be unable to return there due to COVID positive status. Due to dialysis, SNF placement will likely be challenging. CSW will continue to follow.   Mckynna Vanloan LCSW

## 2020-05-04 NOTE — Progress Notes (Signed)
[facility] Progress Note  Subjective:   Patient not examined today directly given COVID-19 + status, utilizing data taken from chart +/- discussions w/ providers and staff.   BP's dropped early in HD yest, not able to get much vol off.    Vitals:   05/04/20 0049 05/04/20 0414 05/04/20 0420 05/04/20 0927  BP:   (!) 146/68 (!) 132/108  Pulse:   (!) 59 68  Resp:   14   Temp: 98.7 F (37.1 C) 98.3 F (36.8 C)  98.3 F (36.8 C)  TempSrc:  Oral  Oral  SpO2:   100% 93%  Weight:      Height:        Exam:  Patient not examined today directly given COVID-19 + status, utilizing data taken from chart +/- discussions w/ providers and staff.    Home meds :    - metoprolol 25 bid  - zyloprim/ PPI  - insulin lispro 2u tid  - lipitor/ asas 81/ sl ntg prn  - sensipar 30 tts/ rocaltrol 0.25 tts/ velphoro 1 gm tid ac  -  tums 2hs  -  neurontin 300 hs/ wellbutrin sr 150 bid  - eye drops/ vitmains/ prn's   Dialysis: [facility] TTS = COVID shift, will go back to MWF after 21 day period    4h 106mn  400/800  118kg  2/2.5 bath  Hep none  TDC  Venofer 100 q HD until 4/17  Mircera 225 (last 4/1)  Sensipar 30 Calcitriol 2.25    Assessment/ Plan: 1. Osteomyelitis left foot/ ischemic changes right leg: seen by [facility], pt refused revasc/ angio/ amputation on 4/29, was rescheduled for 5/3 but postponed given continued GIB issues, may not be able to tolerate antiplatelet agents.  2. Hx PAD/ recent L 1st toe amp - w/ non-healing wound, per [facility]VS plan is for debridement when pt will consent for procedure.  3. LGIB: Prior history AVM's, seen by gastroenterology with colonoscopy done 5/4 w/ blood throughout the colon, unclear source. CTA abd was neg for source. Capsule endoscopy planned for GI's last notes.  4. ESRD: HD TTS now w/ COVID+.  HD tomorrow.  5. BP/volume: BP's up a bit, on metoprolol. Under dry, didn't tolerate attempt at UF yest. Will keep even w/ HD Sat.  6. Anemia: Acute blood loss  anemia superimposed on anemia of ESRD/chronic kidney disease.  Hemodynamically stable and received ESA with dialysis 5/1.  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 8. Nutrition: Continue renal diet with fluid restriction and nutritional supplementation to improve wound healing.     Rob [name] [name] 05/04/2020, 11:52 AM   Recent Labs  Lab 05/02/2020 0256 05/04/20 0630  K 3.8 4.0  BUN 19 8  CREATININE 6.48* 4.10*  CALCIUM 7.2* 7.4*  PHOS 3.4 2.4*  HGB 7.0* 7.3*   Inpatient medications: . sodium chloride   Intravenous Once  . acetaminophen  650 mg Oral Once  . allopurinol  100 mg Oral Daily  . atorvastatin  40 mg Oral q1800  . buPROPion  150 mg Oral BID  . calcitRIOL  2.25 mcg Oral Q T,Th,Sat-1800  . Chlorhexidine Gluconate Cloth  6 each Topical Q0600  . cinacalcet  30 mg Oral Q T,Th,Sat-1800  . darbepoetin (ARANESP) injection - DIALYSIS  150 mcg Intravenous Q Sat-HD  . diphenhydrAMINE  25 mg Intravenous Once  . feeding supplement  1 Container Oral TID BM  . gabapentin  300 mg Oral QHS  .  linaclotide  72 mcg Oral QAC breakfast  . loratadine  10 mg Oral Daily  . metoprolol tartrate  25 mg Oral BID  . pantoprazole  40 mg Oral Daily  . peg 3350 powder  0.5 kit Oral Once  . polyethylene glycol  17 g Oral BID  . sodium chloride flush  3 mL Intravenous Q12H  . sucroferric oxyhydroxide  1,000 mg Oral TID WC   . sodium chloride    . sodium chloride    . sodium chloride    . piperacillin-tazobactam (ZOSYN)  IV 2.25 g (05/04/20 0543)   sodium chloride, sodium chloride, sodium chloride, acetaminophen, albuterol, alteplase, heparin, lidocaine (PF), lidocaine-prilocaine, metoprolol tartrate, mometasone-formoterol, nitroGLYCERIN, oxyCODONE, pentafluoroprop-tetrafluoroeth, sodium chloride flush, sodium chloride flush

## 2020-05-04 NOTE — Progress Notes (Signed)
Iv team arrived to replace permacath dressing for the second time tonight as well as replace peripheral access.

## 2020-05-04 NOTE — Progress Notes (Signed)
I am reading the recently performed VCE on behalf of [facility]  These are the findings: Formal report to follow and will be scanned into her record  Incomplete VCE Prolonged gastric retention time (entire study period, 12+ hours). No bleeding from esophagus or stomach during this study period.  --Abd xray to evaluate for retained pill camera --Consider repeat study with endoscopic placement of video capsule.

## 2020-05-04 NOTE — Progress Notes (Signed)
Patient removed dressing from Hemodialysis catheter and placed on table along with peripheral IV catheter she also removed- IV team notified. Pt states she doesn't want it until she gets food like the doctor promised. Pt agrees to have site addressed by team. Already has a CL diet and has been eating intermittently since this nurses arrival at 7 PM. Pt mentates clearly.

## 2020-05-04 NOTE — Progress Notes (Signed)
Pt removed all EKG leads and the dressing for the hemodialysis catheter. IV team was notified. Pt states she doesn't want "it" does not choose to clarify what "it" is.Educated her on the extent of the degree of invasive placement that is inherent in receiving a hemodialysis catheter of that type. Pt states she will leave the dressing in place when it is replaced. Given jello and apple juice as requested

## 2020-05-04 NOTE — Progress Notes (Signed)
     Progress Note  CC:     Rectal bleeding     ASSESSMENT AND PLAN:   # Hematochezia / anemia requiring transfusions --s/p several endoscopic evaluations for occult and overt GI bleeding / anemia.  --Awaiting endo capsule study results, should have them by this afternoon.  --Says she is still passing " a small amount of blood" with each BM a couple of times a day. Per RN she had a large, dark red BM this am. Based on that we ordered a stat CT angio but it hasn't yet been done.   --Hgb stable since last transfusion yesterday afternoon ( 7 >>> 7.3)  # ESRD  # COVID 19 asymptomatic.     SUBJECTIVE   No complaints. No abdominal pain.    OBJECTIVE:     Vital signs in last 24 hours: Temp:  [98.3 F (36.8 C)-98.8 F (37.1 C)] 98.3 F (36.8 C) (05/07 0927) Pulse Rate:  [59-83] 68 (05/07 0927) Resp:  [14-19] 14 (05/07 0420) BP: (108-163)/(36-110) 132/108 (05/07 0927) SpO2:  [93 %-100 %] 93 % (05/07 0927) Weight:  [113.5 kg] 113.5 kg (05/06 1802) Last BM Date: 05/14/2020 General:   Alert, in NAD Heart:  Regular rate and rhythm.  No lower extremity edema   Pulm: Normal respiratory effort   Abdomen:  Soft,  nontender, nondistended.  Normal bowel sounds.          Neurologic:  Alert and  oriented,  grossly normal neurologically. Psych:  Pleasant, cooperative.  Normal mood and affect.   Intake/Output from previous day: 05/06 0701 - 05/07 0700 In: 1072 [P.O.:747; I.V.:10; Blood:315] Out: -439  Intake/Output this shift: No intake/output data recorded.  Lab Results: Recent Labs    05/02/20 0749 05/07/2020 0256 05/04/20 0630  WBC 9.3 6.6 6.2  HGB 8.3* 7.0* 7.3*  HCT 26.7* 22.7* 24.1*  PLT 343 293 269   BMET Recent Labs    05/02/20 0749 05/19/2020 0256 05/04/20 0630  NA 138 137 139  K 4.3 3.8 4.0  CL 99 101 100  CO2 25 25 25   GLUCOSE 74 93 125*  BUN 15 19 8   CREATININE 5.08* 6.48* 4.10*  CALCIUM 7.7* 7.2* 7.4*   LFT Recent Labs    05/04/20 0630  ALBUMIN 2.2*      Active Problems:   Essential hypertension   Type 2 diabetes mellitus with chronic kidney disease on chronic dialysis, with long-term current use of insulin (HCC)   Obesity, Class III, BMI 40-49.9 (morbid obesity) (HCC)   Chronic combined systolic and diastolic CHF (congestive heart failure) (HCC)   ESRD on dialysis ([place])   Diabetic gastroparesis ([place])   Unspecified atrial fibrillation ([place])   AVM (arteriovenous malformation) of small bowel, acquired   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

## 2020-05-04 NOTE — Progress Notes (Addendum)
PROGRESS NOTE                                                                                                                                                                                                             Patient Demographics:    [name] [name], is a 69 y.o. female, DOB - [birth year], HVF:[id number]  Outpatient Primary MD for the patient is [facility], [place] date - 03/31/2020   LOS - 9  Chief Complaint  Patient presents with   Altered Mental Status       Brief Narrative: Patient is a 69 y.o. female with PMHx of ESRD, PAD-s/p left great toe amputation on 04/11/2020, CAD s/p CABG, DM-2, HTN-who was transferred from her SNF for evaluation of acute metabolic encephalopathy in the setting of gangrenous changes to the right foot.  She was also incidentally found to have COVID-19.  Hospital course complicated by recurrent GI bleeding of obscure etiology-see below.  Significant Events: 4/6-4/22>> admit to [facility] for upper GI bleeding and blood loss anemia, left great toe osteo s/p amputation 4/28>> admit to [facility] for acute metabolic encephalopathy 6/43>> refused to sign consent for angiogram/amputation-rescheduled for 5/3 4/30>> bloody stools  5/2>> colonoscopy postponed-incomplete prep-due to refusal to complete bowel prep 5/3>> colonoscopy postponed-incomplete prep-due to refusal to complete bowel prep, lower extremity angiogram postponed-due to GI bleeding. 5/4>> colonoscopy  COVID-19 medications: Remdesivir: 4/28>> 4/29  Antibiotics: Vancomycin: 4/28>>5/1 Zosyn: 4/28>>5/7  Microbiology data: 4/28: Blood culture>> no growth 4/28: Wound swab/superficial culture>> Pseudomonas  DVT prophylaxis: Hold Heparin 4/30 2/2 GI bleed  Procedures: 5/4>> colonoscopy-residual blood throughout: 5/4>> CT mesenteric angiogram: No active bleeding source 4/14>> left great toe amputation 4/10>>  EGD  Consults: VVS, renal,GI    Subjective:   1 bloody BM early this morning.  Patient has no complaints-denies any chest pain or shortness of breath.  Denies any abdominal pain.   Assessment  & Plan :   Acute metabolic encephalopathy: Likely secondary to SIRS/right foot gangrene-uremia due to missed HD.  Significantly improved after treatment of SIRS/foot infection with gangrene and dialysis-now back to baseline.  SIRS (present on admission) with right lower extremity critical limb ischemia: Afebrile-SIRS physiology has improved-has gangrenous changes evident on exam-please see pictures taken on 4/28 and prior notes.  Blood cultures negative so far.  Superficial swab on admission positive for Pseudomonas-not sure how accurate this is.  Has been on Zosyn for almost 10 days-wounds look relatively stable-suspect we can stop antibiotics and just watch.  Refused angiogram/amputation on 4/29-was rescheduled for 5/3-however with her ongoing GI bleeding-angiogram postponed for now. Very difficult situation-this patient has known history of AVMs and history of recurrent  GI bleeding requiring PRBC transfusion-and may not be able to tolerate long-term dual antiplatelet therapy.  History of PAD with recent left great toe amputation-now with nonhealing left foot wound: Will need debridement with vascular surgery once her GI bleeding has resolved.  No longer on aspirin due to GI bleeding-remains on statin.  Recurrent upper GI bleeding-history of AVMs and acute blood loss anemia: Continues to have stuttering hematochezia/melena-received 1 unit of PRBC yesterday-hemoglobin only at 7.3 today.  Had a bloody bowel movement earlier this morning-repeat CBC this afternoon-may require a PRBC transfusion today as well.  After discussion with GI-CT angiogram of the abdomen pending (prior CT mesenteric angiogram on 5/4 was negative for bleeding).  Awaiting formal reading of capsule endoscopy.    Hyperkalemia:  Secondary to missed HD-resolved with HD.  Follow periodically.  ESRD: Nephrology following and directing care.  Anemia: Multifactorial-likely secondary to kidney disease-worsened by blood loss-we will transfuse another unit of PRBC today.  HTN: BP relatively well controlled-continue metoprolol  DM-2 with hypoglycemia (A1C 5.1 on 04/03/2020): No further hypoglycemic episodes-continue to hold all insulin for now-she is n.p.o. for colonoscopy.  Not a good candidate for tight glycemic control-allow some amount of permissive hyperglycemia.  Recent Labs    05/08/2020 2109 05/04/20 0808 05/04/20 1226  GLUCAP 119* 128* 209*   Paroxysmal atrial fibrillation/atrial flutter: Not a new diagnosis-Per discharge summary in October 2020-she also had atrial fibrillation  as well.  Given recurrent GI bleeding/anemia-not a good candidate for anticoagulation.  Continue rate control with metoprolol.  Chronic combined diastolic and systolic heart failure (EF 45-50% by TTE on 04/28/2019): Volume status stable-diuresis with HD.  History of CVA: Continue statin-see above regarding aspirin and anticoagulation  Gout: Not in flare-continue allopurinol  Covid 19 Viral infection: Appears to be asymptomatic--on room air this morning.  Chest x-ray on admission did not show pneumonia.  Fever: afebrile  O2 requirements:  SpO2: 93 % O2 Flow Rate (L/min): 2 L/min   COVID-19 Labs: No results for input(s): DDIMER, FERRITIN, LDH, CRP in the last 72 hours.     Component Value Date/Time   BNP 897.8 (H) 01/01/2020 0600    No results for input(s): PROCALCITON in the last 168 hours.  Lab Results  Component Value Date   SARSCOV2NAA POSITIVE (A) 04/15/2020   [place] NEGATIVE 04/16/2020   [place] NEGATIVE 04/03/2020   [place] NEGATIVE 03/15/2020     Morbid Obesity: Estimated body mass index is 39.19 kg/m as calculated from the following:   Height as of this encounter: '5\' 7"'  (1.702 m).   Weight as of  this encounter: 113.5 kg.     ABG:    Component Value Date/Time   PHART 7.419 02/05/2020 1114   PCO2ART 43.0 02/05/2020 1114   PO2ART 85.1 02/05/2020 1114   HCO3 27.2 02/05/2020 1114   TCO2 22 01/01/2020 0614   ACIDBASEDEF 6.0 (H) 01/01/2020 0614   O2SAT 95.3 02/05/2020 1114    Vent Settings: N/A    Condition - Guarded  Family Communication  : Aunt over the phone 5/6-we will update on 5/8.  Code Status :  Full Code  Diet :  Diet Order  Diet full liquid Room service appropriate? Yes; Fluid consistency: Thin  Diet effective now               Disposition Plan  :  Status is: Inpatient  Remains inpatient appropriate because:Inpatient level of care appropriate due to severity of illness  Dispo: The patient is from: SNF              Anticipated d/c is to: SNF              Anticipated d/c date is: > 3 days              Patient currently is not medically stable to d/c.  Barriers to discharge: Ongoing GI bleeding without obvious source-right foot gangrene on IV antibiotics-probably will require revascularization/amputation once GI bleeding is controlled.  Antimicorbials  :    Anti-infectives (From admission, onward)   Start     Dose/Rate Route Frequency Ordered Stop   04/28/20 1200  vancomycin (VANCOCIN) IVPB 1000 mg/200 mL premix  Status:  Discontinued     1,000 mg 200 mL/hr over 60 Minutes Intravenous Every T-Th-Sa (Hemodialysis) 04/27/20 1154 04/28/20 1218   04/26/20 1400  piperacillin-tazobactam (ZOSYN) IVPB 2.25 g     2.25 g 100 mL/hr over 30 Minutes Intravenous Every 8 hours 04/26/20 1314     04/26/20 1000  remdesivir 100 mg in sodium chloride 0.9 % 100 mL IVPB  Status:  Discontinued     100 mg 200 mL/hr over 30 Minutes Intravenous Daily 04/24/2020 1952 04/26/20 1105   04/20/2020 2030  remdesivir 200 mg in sodium chloride 0.9% 250 mL IVPB     200 mg 580 mL/hr over 30 Minutes Intravenous Once 03/31/2020 1952 04/23/2020 2327   04/10/2020 1515  vancomycin  (VANCOCIN) 2,500 mg in sodium chloride 0.9 % 500 mL IVPB     2,500 mg 250 mL/hr over 120 Minutes Intravenous  Once 03/30/2020 1513 04/02/2020 2026   03/30/2020 1513  vancomycin variable dose per unstable renal function (pharmacist dosing)  Status:  Discontinued      Does not apply See admin instructions 04/09/2020 1514 04/28/20 1218   04/24/2020 1500  piperacillin-tazobactam (ZOSYN) IVPB 3.375 g     3.375 g 100 mL/hr over 30 Minutes Intravenous  Once 04/24/2020 1450 04/24/2020 2026      Inpatient Medications  Scheduled Meds:  sodium chloride   Intravenous Once   acetaminophen  650 mg Oral Once   allopurinol  100 mg Oral Daily   atorvastatin  40 mg Oral q1800   buPROPion  150 mg Oral BID   calcitRIOL  2.25 mcg Oral Q T,Th,Sat-1800   Chlorhexidine Gluconate Cloth  6 each Topical Q0600   cinacalcet  30 mg Oral Q T,Th,Sat-1800   darbepoetin (ARANESP) injection - DIALYSIS  150 mcg Intravenous Q Sat-HD   diphenhydrAMINE  25 mg Intravenous Once   feeding supplement  1 Container Oral TID BM   gabapentin  300 mg Oral QHS   linaclotide  72 mcg Oral QAC breakfast   loratadine  10 mg Oral Daily   metoprolol tartrate  25 mg Oral BID   pantoprazole  40 mg Oral Daily   peg 3350 powder  0.5 kit Oral Once   polyethylene glycol  17 g Oral BID   sodium chloride flush  3 mL Intravenous Q12H   sucroferric oxyhydroxide  1,000 mg Oral TID WC   Continuous Infusions:  sodium chloride     sodium chloride     sodium chloride  piperacillin-tazobactam (ZOSYN)  IV 2.25 g (05/04/20 1329)   PRN Meds:.sodium chloride, sodium chloride, sodium chloride, acetaminophen, albuterol, alteplase, heparin, lidocaine (PF), lidocaine-prilocaine, metoprolol tartrate, mometasone-formoterol, nitroGLYCERIN, oxyCODONE, pentafluoroprop-tetrafluoroeth, sodium chloride flush, sodium chloride flush   Time Spent in minutes 25   See all Orders from today for further details   Oren Binet M.D on 05/04/2020 at  3:20 PM  To page go to [email or web address] - use universal password  [facility] -  Office  [phone]    Objective:   Vitals:   05/04/20 0049 05/04/20 0414 05/04/20 0420 05/04/20 0927  BP:   (!) 146/68 (!) 132/108  Pulse:   (!) 59 68  Resp:   14   Temp: 98.7 F (37.1 C) 98.3 F (36.8 C)  98.3 F (36.8 C)  TempSrc:  Oral  Oral  SpO2:   100% 93%  Weight:      Height:        Wt Readings from Last 3 Encounters:  05/25/2020 113.5 kg  04/19/20 118.4 kg  03/02/20 120.2 kg     Intake/Output Summary (Last 24 hours) at 05/04/2020 1520 Last data filed at 05/24/2020 2054 Gross per 24 hour  Intake 832 ml  Output -439 ml  Net 1271 ml    Physical Exam Gen Exam:Alert awake-not in any distress HEENT:atraumatic, normocephalic Chest: B/L clear to auscultation anteriorly CVS:S1S2 regular Abdomen:soft non tender, non distended Extremities:no edema Neurology: Non focal Skin: no rash   Data Review:    CBC Recent Labs  Lab 04/28/20 0719 04/29/20 0632 05/24/2020 0500 05/14/2020 0501 05/02/20 0749 05/16/2020 0256 05/04/20 0630  WBC 7.6   < > 7.0 7.5 9.3 6.6 6.2  HGB 7.7*   < > 7.1* 8.0* 8.3* 7.0* 7.3*  HCT 25.0*   < > 23.3* 25.6* 26.7* 22.7* 24.1*  PLT 313   < > 290 301 343 293 269  MCV 84.5   < > 85.0 87.7 87.8 87.6 88.6  MCH 26.0   < > 25.9* 27.4 27.3 27.0 26.8  MCHC 30.8   < > 30.5 31.3 31.1 30.8 30.3  RDW 19.0*   < > 18.9* 18.7* 18.7* 19.1* 19.0*  LYMPHSABS 1.3  --   --   --   --   --   --   MONOABS 0.6  --   --   --   --   --   --   EOSABS 0.1  --   --   --   --   --   --   BASOSABS 0.0  --   --   --   --   --   --    < > = values in this interval not displayed.    Chemistries  Recent Labs  Lab 05/25/2020 0500 05/21/2020 0501 05/02/20 0749 04/29/2020 0256 05/04/20 0630  NA 140 144 138 137 139  K 3.5 3.6 4.3 3.8 4.0  CL 100 101 99 101 100  CO2 '25 23 25 25 25  ' GLUCOSE 106* 99 74 93 125*  BUN 30* 35* '15 19 8  ' CREATININE 7.23* 8.65* 5.08* 6.48* 4.10*  CALCIUM 7.2*  7.3* 7.7* 7.2* 7.4*   ------------------------------------------------------------------------------------------------------------------ No results for input(s): CHOL, HDL, LDLCALC, TRIG, CHOLHDL, LDLDIRECT in the last 72 hours.  Lab Results  Component Value Date   HGBA1C 5.1 04/03/2020   ------------------------------------------------------------------------------------------------------------------ No results for input(s): TSH, T4TOTAL, T3FREE, THYROIDAB in the last 72 hours.  Invalid input(s): FREET3 ------------------------------------------------------------------------------------------------------------------ No results for input(s): VITAMINB12, FOLATE, FERRITIN, TIBC,  IRON, RETICCTPCT in the last 72 hours.  Coagulation profile No results for input(s): INR, PROTIME in the last 168 hours.  No results for input(s): DDIMER in the last 72 hours.  Cardiac Enzymes No results for input(s): CKMB, TROPONINI, MYOGLOBIN in the last 168 hours.  Invalid input(s): CK ------------------------------------------------------------------------------------------------------------------    Component Value Date/Time   BNP 897.8 (H) 01/01/2020 0600    Micro Results Recent Results (from the past 240 hour(s))  Culture, blood (Routine x 2)     Status: None   Collection Time: 04/16/2020  1:00 PM   Specimen: BLOOD  Result Value Ref Range Status   Specimen Description BLOOD RIGHT ANTECUBITAL  Final   Special Requests   Final    BOTTLES DRAWN AEROBIC AND ANAEROBIC Blood Culture results may not be optimal due to an inadequate volume of blood received in culture bottles   Culture   Final    NO GROWTH 5 DAYS Performed at [facility], [place] [address], [place], [place] [address]    Report Status 05/02/2020 FINAL  Final  Culture, blood (Routine x 2)     Status: None   Collection Time: 04/24/2020  1:20 PM   Specimen: BLOOD RIGHT HAND  Result Value Ref Range Status   Specimen Description BLOOD  RIGHT HAND  Final   Special Requests   Final    BOTTLES DRAWN AEROBIC ONLY Blood Culture results may not be optimal due to an inadequate volume of blood received in culture bottles   Culture   Final    NO GROWTH 5 DAYS Performed at [facility], [place] [address] [place], [place] [address]    Report Status 05/17/2020 FINAL  Final  Respiratory Panel by RT PCR (Flu A&B, Covid) - Nasopharyngeal Swab     Status: Abnormal   Collection Time: 03/31/2020  3:58 PM   Specimen: Nasopharyngeal Swab  Result Value Ref Range Status   SARS Coronavirus 2 by RT PCR POSITIVE (A) NEGATIVE Final    Comment: RESULT CALLED TO, READ BACK BY AND VERIFIED WITH: K [facility] RN 04/13/2020 1751 JDW (NOTE) SARS-CoV-2 target nucleic acids are DETECTED. SARS-CoV-2 RNA is generally detectable in upper respiratory specimens  during the acute phase of infection. Positive results are indicative of the presence of the identified virus, but do not rule out bacterial infection or co-infection with other pathogens not detected by the test. Clinical correlation with patient history and other diagnostic information is necessary to determine patient infection status. The expected result is Negative. Fact Sheet for Patients:  PinkCheek.be Fact Sheet for Healthcare Providers: GravelBags.it This test is not yet approved or cleared by the [place] FDA and  has been authorized for detection and/or diagnosis of SARS-CoV-2 by FDA under an Emergency Use Authorization (EUA).  This EUA will remain in effect (meaning this test can be used) for th e duration of  the COVID-19 declaration under Section 564(b)(1) of the Act, 21 U.S.C. section 360bbb-3(b)(1), unless the authorization is terminated or revoked sooner.    Influenza A by PCR NEGATIVE NEGATIVE Final   Influenza B by PCR NEGATIVE NEGATIVE Final    Comment: (NOTE) The Xpert Xpress SARS-CoV-2/FLU/RSV assay is intended  as an aid in  the diagnosis of influenza from Nasopharyngeal swab specimens and  should not be used as a sole basis for treatment. Nasal washings and  aspirates are unacceptable for Xpert Xpress SARS-CoV-2/FLU/RSV  testing. Fact Sheet for Patients: PinkCheek.be Fact Sheet for Healthcare Providers: GravelBags.it This test  is not yet approved or cleared by the [place] and  has been authorized for detection and/or diagnosis of SARS-CoV-2 by  FDA under an Emergency Use Authorization (EUA). This EUA will remain  in effect (meaning this test can be used) for the duration of the  Covid-19 declaration under Section 564(b)(1) of the Act, 21  U.S.C. section 360bbb-3(b)(1), unless the authorization is  terminated or revoked. Performed at [facility], [place] [address], [place], [place] [address]   Wound or Superficial Culture     Status: None   Collection Time: 04/03/2020  8:43 PM   Specimen: Toe; Wound  Result Value Ref Range Status   Specimen Description TOE  Final   Special Requests RIGHT  Final   Gram Stain   Final    NO WBC SEEN NO ORGANISMS SEEN Performed at [facility], [address] [place], [place] [address]    Culture RARE PSEUDOMONAS AERUGINOSA  Final   Report Status 04/28/2020 FINAL  Final   Organism ID, Bacteria PSEUDOMONAS AERUGINOSA  Final      Susceptibility   Pseudomonas aeruginosa - MIC*    CEFTAZIDIME 4 SENSITIVE Sensitive     CIPROFLOXACIN <=0.25 SENSITIVE Sensitive     GENTAMICIN 4 SENSITIVE Sensitive     IMIPENEM 2 SENSITIVE Sensitive     PIP/TAZO 8 SENSITIVE Sensitive     CEFEPIME 4 SENSITIVE Sensitive     * RARE PSEUDOMONAS AERUGINOSA    Radiology Reports DG Chest 2 View  Result Date: 04/03/2020 CLINICAL DATA:  69 year old female with suspected sepsis. Bilateral foot infection for the past 3 weeks. EXAM: CHEST - 2 VIEW COMPARISON:  Chest x-ray 04/04/2020. FINDINGS: Lung volumes  are normal. No consolidative airspace disease. No pleural effusions. No evidence of frank pulmonary edema. Mild cephalization of the pulmonary vasculature. Mild cardiomegaly. Upper mediastinal contours are within normal limits. Aortic atherosclerosis. Status post median sternotomy for CABG. Right internal jugular PermCath with tip terminating in the mid to distal superior vena cava. Aortic atherosclerosis. IMPRESSION: 1. Postoperative changes and support apparatus, as above. 2. Mild cardiomegaly with pulmonary venous congestion, without frank pulmonary edema. 3. Aortic atherosclerosis. Electronically Signed   By: Vinnie Langton M.D.   On: 04/07/2020 14:52   CT FOOT RIGHT WO CONTRAST  Result Date: 04/12/2020 CLINICAL DATA:  Pain and swelling. EXAM: CT OF THE RIGHT FOOT WITHOUT CONTRAST TECHNIQUE: Multidetector CT imaging of the right foot was performed according to the standard protocol. Multiplanar CT image reconstructions were also generated. COMPARISON:  Radiographs 04/09/2020 FINDINGS: Diffuse subcutaneous soft tissue swelling/edema/fluid involving the entire ankle and foot. No focal fluid collection to suggest a drainable soft tissue abscess. Marked fatty atrophy of the foot and ankle musculature. Suspect myofasciitis involving the plantar foot musculature but no definite findings for pyomyositis. Suspect an open wound along the plantar aspect of the great toe but I do not see any obvious destructive bony changes to suggest osteomyelitis. No definite findings for septic arthritis. The other bony structures of the foot and ankle appear intact. No obvious destructive bony changes, fractures or osteochondral lesions. Extensive small artery calcifications. IMPRESSION: 1. Diffuse subcutaneous soft tissue swelling/edema/fluid involving the entire ankle and foot consistent with cellulitis. No focal fluid collection to suggest a drainable soft tissue abscess. 2. Suspect myofasciitis involving the plantar foot  musculature but no definite findings for pyomyositis. Severe diffuse fatty atrophy of the foot and ankle musculature. 3. Suspect an open wound along the plantar aspect of the  great toe but I do not see any obvious destructive bony changes to suggest osteomyelitis. No definite findings for septic arthritis. 4. If symptoms persist or worsen MRI is suggested for further evaluation. Electronically Signed   By: Marijo Sanes M.D.   On: 04/12/2020 10:51   DG CHEST PORT 1 VIEW  Result Date: 04/04/2020 CLINICAL DATA:  70 year old female with fever. EXAM: PORTABLE CHEST 1 VIEW COMPARISON:  Chest radiograph dated 02/24/2020. FINDINGS: Right-sided dialysis catheter in similar position. There is cardiomegaly with mild vascular congestion. No focal consolidation, pleural effusion or pneumothorax. Median sternotomy wires and CABG vascular clips. No acute osseous pathology. IMPRESSION: Cardiomegaly with mild vascular congestion. No focal consolidation. Electronically Signed   By: Anner Crete M.D.   On: 04/04/2020 17:29   DG ABD ACUTE 2+V W 1V CHEST  Result Date: 04/09/2020 CLINICAL DATA:  Fever with low hemoglobin. EXAM: DG ABDOMEN ACUTE W/ 1V CHEST COMPARISON:  Abdominal film 06/26/2018 and chest x-ray 02/24/2020 FINDINGS: Right IJ central venous catheter unchanged with tip over the SVC. Lungs are adequately inflated with minimal hazy prominence of the perihilar vessels unchanged and likely due to mild chronic vascular congestion. No lobar consolidation or effusion. Mild stable cardiomegaly. Remainder the chest is unchanged. There are several air-filled large and small bowel loops. A few air-filled small bowel loops at the upper limits of normal in diameter over the left abdomen. No evidence of air-fluid levels or free peritoneal air. Remainder of the exam is unchanged. IMPRESSION: Nonspecific, nonobstructive bowel gas pattern with a few air-filled mildly prominent, but nondilated small bowel loops in the left  abdomen. Stable mild cardiomegaly with suggestion of minimal chronic vascular congestion. Electronically Signed   By: Marin Olp M.D.   On: 04/09/2020 09:57   DG Foot Complete Left  Result Date: 04/06/2020 CLINICAL DATA:  Recent amputation. Suspect sepsis. Bilateral foot infection 3 weeks. EXAM: LEFT FOOT - COMPLETE 3+ VIEW COMPARISON:  04/09/2020 FINDINGS: There is generalized decreased bone mineralization. Evidence of patient's recent amputation distal to the distal shaft of the first metatarsal. Expected postsurgical changes at the amputation site. No focal bone destruction to suggest osteomyelitis. No air within the soft tissues. Exam is otherwise unchanged. IMPRESSION: Expected changes post amputation distal to the distal aspect of the first metatarsal. No definite bone destruction to suggest osteomyelitis. No air within the soft tissues. Electronically Signed   By: Marin Olp M.D.   On: 04/07/2020 14:58   DG Foot Complete Left  Result Date: 04/09/2020 CLINICAL DATA:  Foot pain and swelling EXAM: LEFT FOOT - COMPLETE 3+ VIEW COMPARISON:  02/01/2020 MRI, 01/31/2020 plain film FINDINGS: There is been interval amputation of the first toe. Persistent soft tissue wound is seen. There is lucency identified in the head of the first metatarsal suspicious for recurrent osteomyelitis. No other fracture or dislocation is seen. No other soft tissue abnormality is noted. IMPRESSION: Changes suspicious for osteomyelitis in the head of the first metatarsal. Overlying soft tissue wound is noted. Electronically Signed   By: Inez Catalina M.D.   On: 04/09/2020 19:40   DG Foot Complete Right  Result Date: 04/07/2020 CLINICAL DATA:  Recent amputation with possible infection three weeks. Suspect sepsis. EXAM: RIGHT FOOT COMPLETE - 3+ VIEW COMPARISON:  CT 04/12/2020 FINDINGS: Diffuse decreased bone mineralization. Degenerative change over the first MTP joint and interphalangeal joints. Mild horizontal sclerosis with  subtle lucency involving the head of the fifth metatarsal possibly subacute fracture. No focal bone destruction to suggest osteomyelitis. No  air within the soft tissues. IMPRESSION: 1. No definite evidence of osteomyelitis and no air within the soft tissues. 2. Linear sclerosis with subtle lucency involving the head of the fifth metatarsal which may be due to nondisplaced subacute fracture. Electronically Signed   By: Marin Olp M.D.   On: 04/04/2020 14:56   VAS [place] LOWER EXTREMITY VENOUS (DVT)  Result Date: 04/10/2020  Lower Venous DVTStudy Indications: Edema, and fever.  Limitations: Body habitus and poor ultrasound/tissue interface. Comparison Study: 04/28/19 previous Performing Technologist: Abram Sander RVS  Examination Guidelines: A complete evaluation includes B-mode imaging, spectral Doppler, color Doppler, and power Doppler as needed of all accessible portions of each vessel. Bilateral testing is considered an integral part of a complete examination. Limited examinations for reoccurring indications may be performed as noted. The reflux portion of the exam is performed with the patient in reverse Trendelenburg.  +---------+---------------+---------+-----------+----------+--------------+  RIGHT     Compressibility Phasicity Spontaneity Properties Thrombus Aging  +---------+---------------+---------+-----------+----------+--------------+  CFV       Full            Yes       Yes                                    +---------+---------------+---------+-----------+----------+--------------+  SFJ       Full                                                             +---------+---------------+---------+-----------+----------+--------------+  FV Prox   Full                                                             +---------+---------------+---------+-----------+----------+--------------+  FV Mid    Full                                                              +---------+---------------+---------+-----------+----------+--------------+  FV Distal Full                                                             +---------+---------------+---------+-----------+----------+--------------+  PFV       Full                                                             +---------+---------------+---------+-----------+----------+--------------+  POP       Full            Yes       Yes                                    +---------+---------------+---------+-----------+----------+--------------+  PTV       Full                                                             +---------+---------------+---------+-----------+----------+--------------+  PERO                                                       Not visualized  +---------+---------------+---------+-----------+----------+--------------+   +---------+---------------+---------+-----------+----------+--------------+  LEFT      Compressibility Phasicity Spontaneity Properties Thrombus Aging  +---------+---------------+---------+-----------+----------+--------------+  CFV       Full            Yes       Yes                                    +---------+---------------+---------+-----------+----------+--------------+  SFJ       Full                                                             +---------+---------------+---------+-----------+----------+--------------+  FV Prox   Full                                                             +---------+---------------+---------+-----------+----------+--------------+  FV Mid    Full                                                             +---------+---------------+---------+-----------+----------+--------------+  FV Distal Full                                                             +---------+---------------+---------+-----------+----------+--------------+  PFV       Full                                                              +---------+---------------+---------+-----------+----------+--------------+  POP       Full            Yes       Yes                                    +---------+---------------+---------+-----------+----------+--------------+  PTV       Full                                                             +---------+---------------+---------+-----------+----------+--------------+  PERO                                                       Not visualized  +---------+---------------+---------+-----------+----------+--------------+     Summary: BILATERAL: - No evidence of deep vein thrombosis seen in the lower extremities, bilaterally.   *See table(s) above for measurements and observations. Electronically signed by Deitra Mayo MD on 04/10/2020 at 3:24:49 PM.    Final    CT Angio Abd/Pel w/ and/or w/o  Result Date: 05/02/2020 CLINICAL DATA:  69 year old female currently COVID-19 positive with bleeding per rectum concerning for lower GI bleed. EXAM: CTA ABDOMEN AND PELVIS WITHOUT AND WITH CONTRAST TECHNIQUE: Multidetector CT imaging of the abdomen and pelvis was performed using the standard protocol during bolus administration of intravenous contrast. Multiplanar reconstructed images and MIPs were obtained and reviewed to evaluate the vascular anatomy. CONTRAST:  165m OMNIPAQUE IOHEXOL 350 MG/ML SOLN COMPARISON:  Prior CT scan of the abdomen and pelvis 08/16/2019 FINDINGS: VASCULAR Aorta: Extensive heterogeneous mixed calcified and fibrofatty atherosclerotic plaque throughout the abdominal aorta. The infrarenal aorta is mildly aneurysmal at a maximum of 3 cm in diameter. Celiac: Calcified atherosclerotic plaque at the origin without significant stenosis. No aneurysm or dissection. SMA: Calcified atherosclerotic plaque results in at least moderate stenosis of the proximal SMA. Fibrofatty atherosclerotic plaque results in focal moderate stenosis in the mid SMA. Renals: There are 2 renal arteries bilaterally. All  for renal arteries are heavily diseased with mixed calcified and fibrofatty atherosclerotic plaque. At least moderate stenoses present bilaterally worse on the left than the right. IMA: Heavily diseased with high-grade stenosis at the origin. The vessel opacifies distally. Inflow: Mixed calcified and fibrofatty atherosclerotic plaque extends into the inflow vessels bilaterally. Both common iliac arteries are ectatic measuring up to 1.8 cm on the left and 2.1 cm on the right. Focal moderate stenoses at the origins of the internal iliac arteries. 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 No significant stenosis or occlusion of the external iliac arteries. Proximal Outflow: Heavily diseased bilaterally. Moderate to high-grade stenosis of the right common femoral artery. Bilateral profunda femoral arteries are heavily diseased bilaterally. High-grade stenosis bordering on occlusion of the proximal right superficial femoral artery. Veins: No focal venous abnormality. Review of the MIP images confirms the above findings. NON-VASCULAR Lower chest: The visualized cardiac structures are normal in size. No pericardial effusion. Moderately large hiatal hernia. The lower lungs are clear. Hepatobiliary: Normal hepatic contour and morphology. Geographic hypoattenuation in the left hemi-liver adjacent to the fissure for the falciform ligament is nonspecific but most suggestive of benign focal fatty infiltration. No discrete solid hepatic lesion. Gallbladder is unremarkable. No intra or extrahepatic biliary ductal dilatation. Pancreas: Unremarkable. No pancreatic ductal dilatation or surrounding inflammatory changes. Spleen: 0.9 cm circumscribed low-attenuation lesion in the posteromedial aspect of the spleen is too small for accurate characterization. Statistically, this is almost certainly a  benign lesion such as a cyst. Adrenals/Urinary Tract: Normal adrenal glands. Renal atrophy bilaterally worse  on the left than the right likely secondary to chronic ischemia related to renal artery stenoses. 1.0 cm low-attenuation lesion in the right interpolar kidney is too small for accurate characterization but remains unchanged compared to prior studies and is almost certainly benign. Right lower pole renal sinus cyst, stable. Air-fluid level present within the bladder. Additionally, there is a semi lunar collection of fluid surrounding the urethra. Stomach/Bowel: No evidence of contrast extravasation into bowel on either the arterial or portal venous phase. Negative for acute bleeding at this time. No significant diverticular disease. Stomach and duodenum are unremarkable. No evidence of bowel wall thickening or obstruction. A metallic clip is present in the ascending colon. Lymphatic: No suspicious lymphadenopathy. Reproductive: Dystrophic calcified uterine fibroid. No adnexal masses. Other: No significant abdominal wall hernia. No evidence of ascites. Musculoskeletal: No acute fracture or aggressive appearing lytic or blastic osseous lesion. Lower lumbar degenerative disc disease most notable at L5-S1. IMPRESSION: VASCULAR 1. No evidence of active GI bleeding. 2. 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 Of note, the pattern of disease is not suggestive of chronic mesenteric ischemia. 3. 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 4. 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 5. 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 Recommend followup by ultrasound in 3 years. This recommendation follows ACR consensus guidelines: White Paper of the ACR Incidental Findings Committee II on Vascular Findings. J Am Coll Radiol 2013; 10:789-794. Aortic aneurysm NOS (ICD10-I71.9); Aortic Atherosclerosis (ICD10-I70.0). NON-VASCULAR 1. No evidence of bowel  wall thickening, inflammation or significant diverticulosis. 2. Metallic clip visible in the ascending colon. 3. Bilateral renal atrophy, likely secondary to chronic ischemia related renal artery stenoses. 4. Air-fluid level in the bladder is presumably related to recent instrumentation. If the patient has not been recently catheterized, then cystitis with a gas-forming organism would be a consideration. 5. Urethrocele. 6. Moderate hiatal hernia. 7. Lower lumbar degenerative disc disease. Electronically Signed   By: Jacqulynn Cadet M.D.   On: 05/02/2020 08:47

## 2020-05-05 ENCOUNTER — Encounter (HOSPITAL_COMMUNITY): Admission: EM | Disposition: E | Payer: Self-pay | Source: Skilled Nursing Facility | Attending: Internal Medicine

## 2020-05-05 ENCOUNTER — Encounter (HOSPITAL_COMMUNITY): Payer: Self-pay | Admitting: Anesthesiology

## 2020-05-05 ENCOUNTER — Inpatient Hospital Stay (HOSPITAL_COMMUNITY): Payer: Medicare (Managed Care)

## 2020-05-05 LAB — CBC
HCT: 24 % — ABNORMAL LOW (ref 36.0–46.0)
Hemoglobin: 7.3 g/dL — ABNORMAL LOW (ref 12.0–15.0)
MCH: 27.9 pg (ref 26.0–34.0)
MCHC: 30.4 g/dL (ref 30.0–36.0)
MCV: 91.6 fL (ref 80.0–100.0)
Platelets: 295 K/uL (ref 150–400)
RBC: 2.62 MIL/uL — ABNORMAL LOW (ref 3.87–5.11)
RDW: 19.4 % — ABNORMAL HIGH (ref 11.5–15.5)
WBC: 7 K/uL (ref 4.0–10.5)
nRBC: 3.3 % — ABNORMAL HIGH (ref 0.0–0.2)

## 2020-05-05 LAB — RENAL FUNCTION PANEL
Albumin: 2 g/dL — ABNORMAL LOW (ref 3.5–5.0)
Anion gap: 15 (ref 5–15)
BUN: 18 mg/dL (ref 8–23)
CO2: 22 mmol/L (ref 22–32)
Calcium: 7.6 mg/dL — ABNORMAL LOW (ref 8.9–10.3)
Chloride: 102 mmol/L (ref 98–111)
Creatinine, Ser: 5.86 mg/dL — ABNORMAL HIGH (ref 0.44–1.00)
GFR calc Af Amer: 8 mL/min — ABNORMAL LOW (ref >60)
GFR calc non Af Amer: 7 mL/min — ABNORMAL LOW (ref >60)
Glucose, Bld: 90 mg/dL (ref 70–99)
Phosphorus: 3.8 mg/dL (ref 2.5–4.6)
Potassium: 3.8 mmol/L (ref 3.5–5.1)
Sodium: 139 mmol/L (ref 135–145)

## 2020-05-05 LAB — GLUCOSE, CAPILLARY
Glucose-Capillary: 90 mg/dL (ref 70–99)
Glucose-Capillary: 85 mg/dL (ref 70–99)
Glucose-Capillary: 156 mg/dL — ABNORMAL HIGH (ref 70–99)

## 2020-05-05 LAB — PREPARE RBC (CROSSMATCH)

## 2020-05-05 SURGERY — IMAGING PROCEDURE, GI TRACT, INTRALUMINAL, VIA CAPSULE
Anesthesia: Monitor Anesthesia Care

## 2020-05-05 SURGERY — ESOPHAGOGASTRODUODENOSCOPY (EGD) WITH PROPOFOL
Anesthesia: Monitor Anesthesia Care

## 2020-05-05 MED ORDER — TECHNETIUM TC 99M-LABELED RED BLOOD CELLS IV KIT
25.0000 | PACK | Freq: Once | INTRAVENOUS | Status: AC | PRN
  Administered 2020-05-05: 25 via INTRAVENOUS

## 2020-05-05 MED ORDER — SODIUM CHLORIDE 0.9% IV SOLUTION: Freq: Once | INTRAVENOUS | Status: DC

## 2020-05-05 MED ORDER — PEG-KCL-NACL-NASULF-NA ASC-C 100 G PO SOLR
0.5000 | Freq: Once | ORAL | Status: AC
  Administered 2020-05-06: 100 g via ORAL
  Filled 2020-05-05: qty 1

## 2020-05-05 MED ORDER — DARBEPOETIN ALFA 150 MCG/0.3ML IJ SOSY
PREFILLED_SYRINGE | INTRAMUSCULAR | Status: AC
  Administered 2020-05-05: 150 ug via INTRAVENOUS
  Filled 2020-05-05: qty 0.3

## 2020-05-05 MED ORDER — DIPHENHYDRAMINE HCL 50 MG/ML IJ SOLN: 25.0000 mg | Freq: Once | INTRAMUSCULAR | Status: DC

## 2020-05-05 MED ORDER — HEPARIN SODIUM (PORCINE) 1000 UNIT/ML IJ SOLN
INTRAMUSCULAR | Status: AC
  Administered 2020-05-05: 3000 [IU] via INTRAVENOUS_CENTRAL
  Filled 2020-05-05: qty 3

## 2020-05-05 MED ORDER — ACETAMINOPHEN 325 MG PO TABS: 650.0000 mg | ORAL_TABLET | Freq: Once | ORAL | Status: DC

## 2020-05-05 NOTE — Progress Notes (Signed)
Patient continues to adamantly refuse miralax or moviprep. MD notified.

## 2020-05-05 NOTE — Progress Notes (Signed)
Rounded on patient to find blood and stool on floor and saturating bed, mostly blood and a little stool. VSS as documented. MD notified. Orders received.

## 2020-05-05 NOTE — Progress Notes (Signed)
[name] NEPHROLOGY PROGRESS NOTE  Assessment/ Plan: Pt is a 69 y.o. yo female with PAD status post left great toe amputation, CAD status post CABG, DM, HTN, ESRD on HD admitted with encephalopathy and gangrenous changes of right foot.  She was found to be COVID-19 positive.  Dialysis: [facility]TS = COVID shift, will go back to MWF , 4h 68mn  400/800  118kg  2/2.5 bath  Hep [name]  TDC.  Venofer 100 q HD until 4/17  Mircera 225 (last 4/1)  Sensipar 30 Calcitriol 2.25  # Osteomyelitis left foot/ ischemic changes right lLKJ:ZPHXby VVS, pt refused revasc/ angio/ amputation on 4/29, was rescheduled for 5/3 but postponed given continued GIB issues, may not be able to tolerate antiplatelet agents.   # Hx PAD/ recent L 1st toe amp - w/ non-healing wound, per VVS plan is for debridement when pt will consent for procedure.   #Acute blood loss anemia due to LGIB: Prior history AVM's, seen by gastroenterology withcolonoscopy done 5/4 w/ blood throughout the colon, unclear source. CTA abd was neg for source. Capsule endoscopy  showed no bleeding from esophagus or stomach.  She had blood transfusion. Had ESA on 5/1.  # ESRD TTS: Receiving HD today.  Tolerating well.  No issue with access.  # Secondary hyperparathyroidism: Continue calcitriol and Velphoro.  Phosphorus level at goal.  Calcium level low therefore discontinue Sensipar.  Monitor lab.  # HTN/volume: Blood pressure acceptable.  On metoprolol.  Subjective: Receiving dialysis today.  No new event.  Patient looks comfortable.  Blood pressure acceptable. Objective Vital signs in last 24 hours: Vitals:   04/28/2020 1000 04/29/2020 1030 04/29/2020 1100 04/29/2020 1130  BP: (!) 135/49 132/71 (!) 154/86 (!) 117/51  Pulse: 74 77 70 64  Resp:      Temp:      TempSrc:      SpO2:      Weight:      Height:       Weight change: -0.4 kg  Intake/Output Summary (Last 24 hours) at 05/04/2020 1148 Last data filed at 05/04/2020 1858 Gross per  24 hour  Intake 50 ml  Output --  Net 50 ml       Labs: Basic Metabolic Panel: Recent Labs  Lab 05/19/2020 0256 05/04/20 0630 05/28/2020 0840  NA 137 139 139  K 3.8 4.0 3.8  CL 101 100 102  CO2 '25 25 22  ' GLUCOSE 93 125* 90  BUN '19 8 18  ' CREATININE 6.48* 4.10* 5.86*  CALCIUM 7.2* 7.4* 7.6*  PHOS 3.4 2.4* 3.8   Liver Function Tests: Recent Labs  Lab 05/04/2020 0256 05/04/20 0630 04/28/2020 0840  ALBUMIN 1.8* 2.2* 2.0*   No results for input(s): LIPASE, AMYLASE in the last 168 hours. No results for input(s): AMMONIA in the last 168 hours. CBC: Recent Labs  Lab 05/02/20 0749 05/02/20 0749 05/20/2020 0256 05/09/2020 0256 05/04/20 0630 05/04/20 1548 05/12/2020 0840  WBC 9.3   < > 6.6   < > 6.2 7.4 7.0  HGB 8.3*   < > 7.0*   < > 7.3* 7.4* 7.3*  HCT 26.7*   < > 22.7*   < > 24.1* 25.2* 24.0*  MCV 87.8  --  87.6  --  88.6 91.0 91.6  PLT 343   < > 293   < > 269 287 295   < > = values in this interval not displayed.   Cardiac Enzymes: No results for input(s): CKTOTAL, CKMB, CKMBINDEX, TROPONINI in  the last 168 hours. CBG: Recent Labs  Lab 05/04/20 0808 05/04/20 1226 05/04/20 1644 05/04/20 2120 05/12/2020 0745  GLUCAP 128* 209* 201* 197* 90    Iron Studies: No results for input(s): IRON, TIBC, TRANSFERRIN, FERRITIN in the last 72 hours. Studies/Results: DG Abd 1 View  Result Date: 05/04/2020 CLINICAL DATA:  Possible retained small-bowel capsule EXAM: ABDOMEN - 1 VIEW COMPARISON:  04/10/2020, CT 05/15/2020 FINDINGS: Nonobstructed gas pattern. Surgical clips in the right lower quadrant. Calcified pelvic phleboliths. Barrel-shaped metallic opacity measuring 17 mm projecting over expected location of distal esophagus/GE junction. IMPRESSION: 1. 17 mm metallic barrel-shaped opacity projects over the expected location of distal esophagus or GE junction. No additional metallic radiopaque foreign bodies are visible over the included abdomen or pelvis. Electronically Signed   By: Donavan Foil M.D.   On: 05/04/2020 17:39   CT Angio Abd/Pel w/ and/or w/o  Result Date: 05/04/2020 CLINICAL DATA:  GI bleed. Persistent bleeding. EGD and colonoscopy were negative. EXAM: CTA ABDOMEN AND PELVIS WITHOUT AND WITH CONTRAST TECHNIQUE: Multidetector CT imaging of the abdomen and pelvis was performed using the standard protocol during bolus administration of intravenous contrast. Multiplanar reconstructed images and MIPs were obtained and reviewed to evaluate the vascular anatomy. CONTRAST:  158m OMNIPAQUE IOHEXOL 350 MG/ML SOLN COMPARISON:  CT dated May 01, 2020 FINDINGS: VASCULAR Aorta: Again noted are atherosclerotic changes of the abdominal aorta with an infrarenal abdominal aortic aneurysm measuring approximately 3-3.2 cm. This is stable in appearance from prior study. Celiac: Atherosclerotic changes are noted of the celiac axis likely resulting in mild stenosis. SMA: Heavy calcifications are noted throughout the SMA likely resulting in tandem mild-to-moderate areas of stenosis. Renals: There are heavy calcifications at the origin of both renal arteries likely resulting in some degree of stenosis. IMA: The IMA is patent. Inflow: There is ectasia of the bilateral common iliac arteries as before. Proximal Outflow: Heavy atherosclerotic changes are noted. Veins: No obvious venous abnormality within the limitations of this arterial phase study. Review of the MIP images confirms the above findings. NON-VASCULAR Lower chest: There is atelectasis at the lung bases.The heart is enlarged. There is reflux of contrast into the IVC. Hepatobiliary: The liver is normal. There appears to be gallbladder sludge or small stones without CT evidence for cholecystitis.There is no biliary ductal dilation. Pancreas: Normal contours without ductal dilatation. No peripancreatic fluid collection. Spleen: There is a small cystic structure in the spleen, of doubtful clinical significance. Adrenals/Urinary Tract: --Adrenal glands:  Unremarkable. --Right kidney/ureter: The right kidney is atrophic without evidence for hydronephrosis. There may be a nonobstructing stone in the lower pole. --Left kidney/ureter: The left kidney is atrophic without evidence for hydronephrosis. --Urinary bladder: There is a moderate amount of gas within the urinary bladder which may related to prior instrumentation. Stomach/Bowel: --Stomach/Duodenum: There is a small to moderate-sized hiatal hernia. The patient's reported small-bowel capsule is likely lodged proximal to the GE junction within the patient's hernia. --Small bowel: Unremarkable. --Colon: Liquid stool is noted within the patient's colon. This may be related to prior colonoscopy preparation. There is no evidence for active GI bleeding. A few clips are noted in the ascending colon, likely related to recent colonoscopy. --Appendix: Normal. Lymphatic: --No retroperitoneal lymphadenopathy. --No mesenteric lymphadenopathy. --No pelvic or inguinal lymphadenopathy. Reproductive: There is a 2.5 cm fluid attenuation structure that appears to be at the level of the proximal vagina/urethra. This is of doubtful clinical significance and may represent a Bartholin gland cyst or Skene duct cyst. Other:  No ascites or free air. The abdominal wall is normal. Musculoskeletal. No acute displaced fractures. IMPRESSION: 1. No evidence for active GI bleeding. If there is slow active GI beading, a tagged red blood cell scan would be more sensitive for localization. 2. There is a small to moderate size hiatal hernia. The patient's reported small-bowel capsule is lodged within this hernia. 3. Cardiomegaly with reflux of contrast into the IVC, suggestive of right heart failure. 4. Atrophic kidneys. 5. There is a moderate amount of gas within the urinary bladder which may be related to prior instrumentation. 6. Stable vascular findings as described on the patient's CT from May 01, 2020. Aortic Atherosclerosis (ICD10-I70.0).  Electronically Signed   By: Constance Holster M.D.   On: 05/04/2020 19:11    Medications: Infusions: . sodium chloride    . sodium chloride    . sodium chloride      Scheduled Medications: . sodium chloride   Intravenous Once  . acetaminophen  650 mg Oral Once  . allopurinol  100 mg Oral Daily  . atorvastatin  40 mg Oral q1800  . buPROPion  150 mg Oral BID  . calcitRIOL  2.25 mcg Oral Q T,Th,Sat-1800  . Chlorhexidine Gluconate Cloth  6 each Topical Q0600  . cinacalcet  30 mg Oral Q T,Th,Sat-1800  . darbepoetin (ARANESP) injection - DIALYSIS  150 mcg Intravenous Q Sat-HD  . diphenhydrAMINE  25 mg Intravenous Once  . feeding supplement  1 Container Oral TID BM  . gabapentin  300 mg Oral QHS  . heparin sodium (porcine)      . linaclotide  72 mcg Oral QAC breakfast  . loratadine  10 mg Oral Daily  . metoprolol tartrate  25 mg Oral BID  . pantoprazole  40 mg Oral Daily  . peg 3350 powder  0.5 kit Oral Once  . polyethylene glycol  17 g Oral BID  . sodium chloride flush  3 mL Intravenous Q12H  . sucroferric oxyhydroxide  1,000 mg Oral TID WC    have reviewed scheduled and prn medications.  Physical Exam: Currently receiving dialysis in Covid isolation.  Patient was seen from the window.  She looks comfortable, able to lie on bed.  Not in distress.  Blood pressure acceptable.  No issue with HD.  Discussed with the dialysis nurse.  Aamirah Salmi Prasad Eldoris Beiser 05/17/2020,11:48 AM  LOS: 10 days  Pager: [phone]

## 2020-05-05 NOTE — Progress Notes (Signed)
Received a call from the [facility] that patient's IV had come out while she was down stairs for her procedure. The tech [place] placed a STAT order for IV team to replace the IV at this time due to poor access. She also stated the patient was "doing fine, just keeps saying she is hungry."

## 2020-05-05 NOTE — Progress Notes (Signed)
Contacted by Eather Colas, RN because the patient had a large bloody bowel movement this afternoon. Found in the bed, a little confused. Hemodynamically stable. Hemoglobin stable this morning at 7.3.   Discussed with Dr. Sloan Leiter. Will proceed with tagged bleeding scan +/- angiogram. Repeat hgb. May need PRBCs. NPO.

## 2020-05-05 NOTE — Progress Notes (Signed)
Pt drank less then 50 ml of prep refuse to drink anymore, oncall MD notified times 2

## 2020-05-05 NOTE — Plan of Care (Signed)
  Problem: Education: Goal: Knowledge of General Education information will improve Description: Including pain rating scale, medication(s)/side effects and non-pharmacologic comfort measures Outcome: Progressing   Problem: Health Behavior/Discharge Planning: Goal: Ability to manage health-related needs will improve Outcome: Progressing  Patient continues to refuse bowel prep meds, states, "I do not want to do that test.   Problem: Activity: Goal: Risk for activity intolerance will decrease Outcome: Progressing  Patient able to turn in bed with assist from staff w/o complaint of SOB.

## 2020-05-05 NOTE — Progress Notes (Addendum)
Progress Note  CC:     Rectal bleeding     ASSESSMENT AND PLAN:   Obscure GI bleeding with intermittent hematochezia / anemia requiring transfusions --no source identified on SBE or colonoscopy  --blood seen throughout the colon on colonoscopy 05/06/2020 --Capsule camera remained in hiatal hernia during prior attempt at VCE 05/24/2020 --No source identified on CTA 05/26/2020 --Planned EGD today with placement of capsule into small bowel today, but patient refused to drink the prep last night GI blood loss anemia --Hgb stable since last transfusion 05/19/2020 Retained video capsule in hiatal hernia History of colonic AVMs - last seen and treated 03/2020, not seen on most recent colonoscpy ESRD COVID 19 asymptomatic Awaiting possible amputation after bleeding source has been identified   Reviewed CTA and abdominal film results with the patient. Discussed indications for endoscopy with the patient today. Agreed to prep tonight if she could eat today. Agree with low residue lunch, clear liquids for dinner. Resume Moviprep tonight. NPO at midnight in preparation for EGD with placement of capsule into the small bowel 05/06/20. Will plan follow-up abdominal films to reassess the location of the last capsule. If it is still present at the time of EGD, will plan to retrieve it. Continue serial hgb/hct with transfusion as indicated in the meantime.     SUBJECTIVE   Drank less than 31mL of Miralax last night. Does not want to drink the prep. Wants to eat. Denies any additional overt GI blood loss.  No family or nursing present at the time of my evaluation.   OBJECTIVE:     Vital signs in last 24 hours: Temp:  [98.3 F (36.8 C)-99.6 F (37.6 C)] 99.6 F (37.6 C) (05/08 0855) Pulse Rate:  [57-77] 64 (05/08 1130) Resp:  [12-23] 20 (05/08 0910) BP: (103-154)/(48-86) 117/51 (05/08 1130) SpO2:  [91 %-100 %] 100 % (05/08 0814) Weight:  [112.4 kg-114.8 kg] 112.4 kg (05/08 0855) Last BM Date:  05/04/20 General:   Alert, in NAD Abdomen:  Soft,  nontender, nondistended.  Normal bowel sounds.          Neurologic:  Alert and  oriented,  grossly normal neurologically. Psych:  Pleasant, cooperative.  Normal mood and affect.   Lab Results: Recent Labs    05/04/20 0630 05/04/20 1548 05/07/2020 0840  WBC 6.2 7.4 7.0  HGB 7.3* 7.4* 7.3*  HCT 24.1* 25.2* 24.0*  PLT 269 287 295   BMET Recent Labs    05/26/2020 0256 05/04/20 0630 05/15/2020 0840  NA 137 139 139  K 3.8 4.0 3.8  CL 101 100 102  CO2 25 25 22   GLUCOSE 93 125* 90  BUN 19 8 18   CREATININE 6.48* 4.10* 5.86*  CALCIUM 7.2* 7.4* 7.6*   LFT Recent Labs    05/07/2020 0840  ALBUMIN 2.0*   ABDOMEN - 1 VIEW 05/04/20 IMPRESSION: 17 mm metallic barrel-shaped opacity projects over the expected location of distal esophagus or GE junction. No additional metallic radiopaque foreign bodies are visible over the included abdomen or pelvis.  CTA Abd/Pelvis 05/04/20 IMPRESSION: 1. No evidence for active GI bleeding. If there is slow active GI beading, a tagged red blood cell scan would be more sensitive for localization. 2. There is a small to moderate size hiatal hernia. The patient's reported small-bowel capsule is lodged within this hernia. 3. Cardiomegaly with reflux of contrast into the IVC, suggestive of right heart failure. 4. Atrophic kidneys. 5. There is a moderate amount of gas within the  urinary bladder which may be related to prior instrumentation. 6. Stable vascular findings as described on the patient's CT from May 01, 2020.   [place], MD 05/23/2020, 11:50 AM

## 2020-05-05 NOTE — Progress Notes (Addendum)
PROGRESS NOTE                                                                                                                                                                                                             Patient Demographics:    [name] [name], is a 69 y.o. female, DOB - 23-Feb-1951, YWV:[id number]  Outpatient Primary MD for the patient is [place] date - 04/10/2020   LOS - 10  Chief Complaint  Patient presents with  . Altered Mental Status       Brief Narrative: Patient is a 69 y.o. 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  She was also incidentally found to have COVID-19.  Hospital course complicated by recurrent GI bleeding of obscure etiology-see below.  Significant Events: 4/6-4/22>> admit to [facility] for upper GI bleeding and blood loss anemia, left great toe osteo s/p amputation 4/28>> admit to [facility] for acute metabolic encephalopathy 8/54>> refused to sign consent for angiogram/amputation-rescheduled for 5/3 4/30>> bloody stools  5/2>> colonoscopy postponed-incomplete prep-due to refusal to complete bowel prep 5/3>> colonoscopy postponed-incomplete prep-due to refusal to complete bowel prep, lower extremity angiogram postponed-due to GI bleeding. 5/4>> colonoscopy- residual blood in the entire colon 5/6>> capsule endoscopy-unfortunately capsule retained in the stomach 5/8>> EGD with capsule placement in small bowel postponed as patient refused to drink prep on 5/7 pm  Procedures/studies: 5/7>> repeat CT mesenteric angiogram: No active bleeding source 5/6>>capsule endoscopy>> capsule retained in  the stomach 5/4>> colonoscopy-residual blood throughout: 5/4>> CT mesenteric angiogram: No active bleeding source 4/14>> left great toe amputation 4/10>> EGD/small bowel enteroscopy>> AVM third portion of duodenum  COVID-19 medications: Remdesivir: 4/28>> 4/29  Antibiotics: Vancomycin: 4/28>>5/1 Zosyn: 4/28>>5/7  Microbiology data: 4/28: Blood culture>> no growth 4/28: Wound swab/superficial culture>> Pseudomonas  DVT prophylaxis: Hold Heparin 4/30 2/2 GI bleed  Consults: VVS, renal,GI    Subjective:   Sometime black stools overnight-he refused to drink bowel prep last night-have counseled again this morning.   Assessment  & Plan :   Acute metabolic encephalopathy: Likely secondary to  SIRS/right foot gangrene-uremia due to missed HD.  Significantly improved after treatment of SIRS/foot infection with gangrene and dialysis-now back to baseline.  SIRS (present on admission) with right lower extremity critical limb ischemia: Afebrile-SIRS physiology has improved-has gangrenous changes evident on exam-please see pictures taken on 4/28 and prior notes.  Blood cultures negative so far.    Superficial swab on admission positive for Pseudomonas-not sure how accurate this is.  Has completed a 10-day course of Zosyn with plans to watch off antibiotics. Refused angiogram/amputation on 4/29-was rescheduled for 5/3-however with her ongoing GI bleeding-angiogram postponed for now. Very difficult situation-this patient has known history of AVMs and history of recurrent  GI bleeding requiring PRBC transfusion-and may not be able to tolerate long-term dual antiplatelet therapy.    Once GI work-up has been completed-we will need to notify vascular surgery (Dr. Raynald Blend angiogram/amputation needs..  History of PAD with recent left great toe amputation-now with nonhealing left foot wound: Will need debridement with vascular surgery once her GI bleeding has resolved.  No longer on aspirin due  to GI bleeding-remains on statin.  Recurrent upper GI bleeding-history of AVMs and acute blood loss anemia: Continues to have stuttering hematochezia/melena-requirement 2 units of PRBC so far (on 5/4 and 5/6).  Hemoglobin stable at 7.3-has been in this range for the past 2 days.  Colonoscopy with residual blood and with suspicion of a small bowel bleed. Unfortunately capsule endoscopy unsuccessful as it was retained in the stomach.  Patient refused bowel prep on 5/7-hence EGD with capsule placement in small bowel postponed for tomorrow.  She has a history of refusing bowel prep-refused it numerous times a few days back prior to her colonoscopy.  Will repeat CBC later this evening-if significant drop will go ahead and transfuse PRBC.  Addendum: Large bloody BM this afternoon-hemodynamically stable-we will cancel CBC scheduled for 5 PM today-instead will go ahead and transfuse 1 unit of PRBC.  After discussion with GI-have ordered a stat tagged RBC scan-if positive she will need a arteriogram for embolization.  Hyperkalemia: Secondary to missed HD-resolved with HD.  Follow periodically.  ESRD: Nephrology following and directing care.  Anemia: Multifactorial-likely secondary to kidney disease-worsened by blood loss-we will transfuse another unit of PRBC today.  HTN: BP relatively well controlled-continue metoprolol  DM-2 with hypoglycemia (A1C 5.1 on 04/03/2020): No further hypoglycemic episodes-continue to hold all insulin for now.  Not a good candidate for tight glycemic control-allow some amount of permissive hyperglycemia.  Recent Labs    05/04/20 2120 04/29/2020 0745 05/02/2020 1410  GLUCAP 197* 90 85   Paroxysmal atrial fibrillation/atrial flutter: Not a new diagnosis-Per discharge summary in October 2020-she also had atrial fibrillation  as well.  Given recurrent GI bleeding/anemia-not a good candidate for anticoagulation.  Continue rate control with metoprolol.  Chronic combined diastolic and  systolic heart failure (EF 45-50% by TTE on 04/28/2019): Volume status stable-diuresis with HD.  History of CVA: Continue statin-see above regarding aspirin and anticoagulation  Gout: Not in flare-continue allopurinol  Covid 19 Viral infection: Appears to be asymptomatic--on room air this morning.  Chest x-ray on admission did not show pneumonia.  Fever: afebrile  O2 requirements:  SpO2: 96 % O2 Flow Rate (L/min): 2 L/min   COVID-19 Labs: No results for input(s): DDIMER, FERRITIN, LDH, CRP in the last 72 hours.     Component Value Date/Time   BNP 897.8 (H) 01/01/2020 0600    No results for input(s): PROCALCITON in the last 168 hours.  Lab  Results  Component Value Date   SARSCOV2NAA POSITIVE (A) 04/09/2020   [place] NEGATIVE 04/16/2020   [place] NEGATIVE 04/03/2020   [place] NEGATIVE 03/15/2020     Morbid Obesity: Estimated body mass index is 38.64 kg/m as calculated from the following:   Height as of this encounter: '5\' 7"'$  (1.702 m).   Weight as of this encounter: 111.9 kg.     ABG:    Component Value Date/Time   PHART 7.419 02/05/2020 1114   PCO2ART 43.0 02/05/2020 1114   PO2ART 85.1 02/05/2020 1114   HCO3 27.2 02/05/2020 1114   TCO2 22 01/01/2020 0614   ACIDBASEDEF 6.0 (H) 01/01/2020 0614   O2SAT 95.3 02/05/2020 1114    Vent Settings: N/A  Condition - Guarded  Family Communication  : Aunt over the phone 5/8-have asked her to talk to the patient and encourage patient to complete her bowel prep this evening.  Code Status :  Full Code  Diet :  Diet Order            Diet NPO time specified  Diet effective midnight        Diet full liquid Room service appropriate? Yes; Fluid consistency: Thin  Diet effective now               Disposition Plan  :  Status is: Inpatient  Remains inpatient appropriate because:Inpatient level of care appropriate due to severity of illness  Dispo: The patient is from: SNF              Anticipated d/c is  to: SNF              Anticipated d/c date is: > 3 days              Patient currently is not medically stable to d/c.  Barriers to discharge: Ongoing GI bleeding without obvious source-right foot gangrene on IV antibiotics-probably will require revascularization/amputation once GI bleeding is controlled.  Antimicorbials  :    Anti-infectives (From admission, onward)   Start     Dose/Rate Route Frequency Ordered Stop   04/28/20 1200  vancomycin (VANCOCIN) IVPB 1000 mg/200 mL premix  Status:  Discontinued     1,000 mg 200 mL/hr over 60 Minutes Intravenous Every T-Th-Sa (Hemodialysis) 04/27/20 1154 04/28/20 1218   04/26/20 1400  piperacillin-tazobactam (ZOSYN) IVPB 2.25 g  Status:  Discontinued     2.25 g 100 mL/hr over 30 Minutes Intravenous Every 8 hours 04/26/20 1314 05/04/20 1524   04/26/20 1000  remdesivir 100 mg in sodium chloride 0.9 % 100 mL IVPB  Status:  Discontinued     100 mg 200 mL/hr over 30 Minutes Intravenous Daily 03/29/2020 1952 04/26/20 1105   03/29/2020 2030  remdesivir 200 mg in sodium chloride 0.9% 250 mL IVPB     200 mg 580 mL/hr over 30 Minutes Intravenous Once 04/16/2020 1952 04/14/2020 2327   04/22/2020 1515  vancomycin (VANCOCIN) 2,500 mg in sodium chloride 0.9 % 500 mL IVPB     2,500 mg 250 mL/hr over 120 Minutes Intravenous  Once 04/15/2020 1513 04/08/2020 2026   04/11/2020 1513  vancomycin variable dose per unstable renal function (pharmacist dosing)  Status:  Discontinued      Does not apply See admin instructions 04/06/2020 1514 04/28/20 1218   04/02/2020 1500  piperacillin-tazobactam (ZOSYN) IVPB 3.375 g     3.375 g 100 mL/hr over 30 Minutes Intravenous  Once 04/01/2020 1450 04/01/2020 2026      Inpatient Medications  Scheduled Meds: .  sodium chloride   Intravenous Once  . acetaminophen  650 mg Oral Once  . allopurinol  100 mg Oral Daily  . atorvastatin  40 mg Oral q1800  . buPROPion  150 mg Oral BID  . calcitRIOL  2.25 mcg Oral Q T,Th,Sat-1800  . Chlorhexidine  Gluconate Cloth  6 each Topical Q0600  . darbepoetin (ARANESP) injection - DIALYSIS  150 mcg Intravenous Q Sat-HD  . diphenhydrAMINE  25 mg Intravenous Once  . feeding supplement  1 Container Oral TID BM  . gabapentin  300 mg Oral QHS  . linaclotide  72 mcg Oral QAC breakfast  . loratadine  10 mg Oral Daily  . metoprolol tartrate  25 mg Oral BID  . pantoprazole  40 mg Oral Daily  . peg 3350 powder  0.5 kit Oral Once  . polyethylene glycol  17 g Oral BID  . sodium chloride flush  3 mL Intravenous Q12H  . sucroferric oxyhydroxide  1,000 mg Oral TID WC   Continuous Infusions: . sodium chloride    . sodium chloride    . sodium chloride     PRN Meds:.sodium chloride, sodium chloride, sodium chloride, acetaminophen, albuterol, alteplase, bisacodyl, heparin, lidocaine (PF), lidocaine-prilocaine, metoprolol tartrate, mometasone-formoterol, nitroGLYCERIN, oxyCODONE, pentafluoroprop-tetrafluoroeth, sodium chloride flush, sodium chloride flush   Time Spent in minutes 25   See all Orders from today for further details   Oren Binet M.D on 05/04/2020 at 2:54 PM  To page go to [email or web address] - use universal password  [facility] -  Office  [phone]    Objective:   Vitals:   05/27/2020 1200 05/07/2020 1230 05/13/2020 1249 04/29/2020 1344  BP: (!) 138/42 (!) 149/56 137/69 (!) 143/55  Pulse: 77 80 78 78  Resp:   18 18  Temp:   98.9 F (37.2 C) 98.3 F (36.8 C)  TempSrc:   Oral Oral  SpO2:   98% 96%  Weight:   111.9 kg   Height:        Wt Readings from Last 3 Encounters:  05/28/2020 111.9 kg  04/19/20 118.4 kg  03/02/20 120.2 kg     Intake/Output Summary (Last 24 hours) at 05/24/2020 1454 Last data filed at 05/21/2020 1249 Gross per 24 hour  Intake 50 ml  Output 0 ml  Net 50 ml    Physical Exam Gen Exam:Alert awake-not in any distress HEENT:atraumatic, normocephalic Chest: B/L clear to auscultation anteriorly CVS:S1S2 regular Abdomen:soft non tender, non  distended Extremities:no edema Neurology: Non focal Skin: no rash   Data Review:    CBC Recent Labs  Lab 05/02/20 0749 05/19/2020 0256 05/04/20 0630 05/04/20 1548 05/17/2020 0840  WBC 9.3 6.6 6.2 7.4 7.0  HGB 8.3* 7.0* 7.3* 7.4* 7.3*  HCT 26.7* 22.7* 24.1* 25.2* 24.0*  PLT 343 293 269 287 295  MCV 87.8 87.6 88.6 91.0 91.6  MCH 27.3 27.0 26.8 26.7 27.9  MCHC 31.1 30.8 30.3 29.4* 30.4  RDW 18.7* 19.1* 19.0* 19.1* 19.4*    Chemistries  Recent Labs  Lab 05/02/2020 0501 05/02/20 0749 05/04/2020 0256 05/04/20 0630 05/14/2020 0840  NA 144 138 137 139 139  K 3.6 4.3 3.8 4.0 3.8  CL 101 99 101 100 102  CO2 '23 25 25 25 22  '$ GLUCOSE 99 74 93 125* 90  BUN 35* '15 19 8 18  '$ CREATININE 8.65* 5.08* 6.48* 4.10* 5.86*  CALCIUM 7.3* 7.7* 7.2* 7.4* 7.6*   ------------------------------------------------------------------------------------------------------------------ No results for input(s): CHOL, HDL, LDLCALC, TRIG, CHOLHDL, LDLDIRECT in the last 72  hours.  Lab Results  Component Value Date   HGBA1C 5.1 04/03/2020   ------------------------------------------------------------------------------------------------------------------ No results for input(s): TSH, T4TOTAL, T3FREE, THYROIDAB in the last 72 hours.  Invalid input(s): FREET3 ------------------------------------------------------------------------------------------------------------------ No results for input(s): VITAMINB12, FOLATE, FERRITIN, TIBC, IRON, RETICCTPCT in the last 72 hours.  Coagulation profile No results for input(s): INR, PROTIME in the last 168 hours.  No results for input(s): DDIMER in the last 72 hours.  Cardiac Enzymes No results for input(s): CKMB, TROPONINI, MYOGLOBIN in the last 168 hours.  Invalid input(s): CK ------------------------------------------------------------------------------------------------------------------    Component Value Date/Time   BNP 897.8 (H) 01/01/2020 0600    Micro  Results Recent Results (from the past 240 hour(s))  Respiratory Panel by RT PCR (Flu A&B, Covid) - Nasopharyngeal Swab     Status: Abnormal   Collection Time: 04/14/2020  3:58 PM   Specimen: Nasopharyngeal Swab  Result Value Ref Range Status   SARS Coronavirus 2 by RT PCR POSITIVE (A) NEGATIVE Final    Comment: RESULT CALLED TO, READ BACK BY AND VERIFIED WITH: K [facility] RN 04/14/2020 1751 JDW (NOTE) SARS-CoV-2 target nucleic acids are DETECTED. SARS-CoV-2 RNA is generally detectable in upper respiratory specimens  during the acute phase of infection. Positive results are indicative of the presence of the identified virus, but do not rule out bacterial infection or co-infection with other pathogens not detected by the test. Clinical correlation with patient history and other diagnostic information is necessary to determine patient infection status. The expected result is Negative. Fact Sheet for Patients:  PinkCheek.be Fact Sheet for Healthcare Providers: GravelBags.it This test is not yet approved or cleared by the [place] FDA and  has been authorized for detection and/or diagnosis of SARS-CoV-2 by FDA under an Emergency Use Authorization (EUA).  This EUA will remain in effect (meaning this test can be used) for th e duration of  the COVID-19 declaration under Section 564(b)(1) of the Act, 21 U.S.C. section 360bbb-3(b)(1), unless the authorization is terminated or revoked sooner.    Influenza A by PCR NEGATIVE NEGATIVE Final   Influenza B by PCR NEGATIVE NEGATIVE Final    Comment: (NOTE) The Xpert Xpress SARS-CoV-2/FLU/RSV assay is intended as an aid in  the diagnosis of influenza from Nasopharyngeal swab specimens and  should not be used as a sole basis for treatment. Nasal washings and  aspirates are unacceptable for Xpert Xpress SARS-CoV-2/FLU/RSV  testing. Fact Sheet for  Patients: PinkCheek.be Fact Sheet for Healthcare Providers: GravelBags.it This test is not yet approved or cleared by the [place] FDA and  has been authorized for detection and/or diagnosis of SARS-CoV-2 by  FDA under an Emergency Use Authorization (EUA). This EUA will remain  in effect (meaning this test can be used) for the duration of the  Covid-19 declaration under Section 564(b)(1) of the Act, 21  U.S.C. section 360bbb-3(b)(1), unless the authorization is  terminated or revoked. Performed at [facility], [place] [address], [place], [place] [address]   Wound or Superficial Culture     Status: None   Collection Time: 03/30/2020  8:43 PM   Specimen: Toe; Wound  Result Value Ref Range Status   Specimen Description TOE  Final   Special Requests RIGHT  Final   Gram Stain   Final    NO WBC SEEN NO ORGANISMS SEEN Performed at [facility], [address], [place], [place] [address]    Culture RARE PSEUDOMONAS AERUGINOSA  Final   Report Status 04/28/2020 FINAL  Final   Organism ID, Bacteria PSEUDOMONAS AERUGINOSA  Final      Susceptibility   Pseudomonas aeruginosa - MIC*    CEFTAZIDIME 4 SENSITIVE Sensitive     CIPROFLOXACIN <=0.25 SENSITIVE Sensitive     GENTAMICIN 4 SENSITIVE Sensitive     IMIPENEM 2 SENSITIVE Sensitive     PIP/TAZO 8 SENSITIVE Sensitive     CEFEPIME 4 SENSITIVE Sensitive     * RARE PSEUDOMONAS AERUGINOSA    Radiology Reports DG Chest 2 View  Result Date: 04/17/2020 CLINICAL DATA:  69 year old female with suspected sepsis. Bilateral foot infection for the past 3 weeks. EXAM: CHEST - 2 VIEW COMPARISON:  Chest x-ray 04/04/2020. FINDINGS: Lung volumes are normal. No consolidative airspace disease. No pleural effusions. No evidence of frank pulmonary edema. Mild cephalization of the pulmonary vasculature. Mild cardiomegaly. Upper mediastinal contours are within normal limits. Aortic  atherosclerosis. Status post median sternotomy for CABG. Right internal jugular PermCath with tip terminating in the mid to distal superior vena cava. Aortic atherosclerosis. IMPRESSION: 1. Postoperative changes and support apparatus, as above. 2. Mild cardiomegaly with pulmonary venous congestion, without frank pulmonary edema. 3. Aortic atherosclerosis. Electronically Signed   By: Vinnie Langton M.D.   On: 04/23/2020 14:52   DG Abd 1 View  Result Date: 05/04/2020 CLINICAL DATA:  Possible retained small-bowel capsule EXAM: ABDOMEN - 1 VIEW COMPARISON:  04/10/2020, CT 05/12/2020 FINDINGS: Nonobstructed gas pattern. Surgical clips in the right lower quadrant. Calcified pelvic phleboliths. Barrel-shaped metallic opacity measuring 17 mm projecting over expected location of distal esophagus/GE junction. IMPRESSION: 1. 17 mm metallic barrel-shaped opacity projects over the expected location of distal esophagus or GE junction. No additional metallic radiopaque foreign bodies are visible over the included abdomen or pelvis. Electronically Signed   By: Donavan Foil M.D.   On: 05/04/2020 17:39   CT FOOT RIGHT WO CONTRAST  Result Date: 04/12/2020 CLINICAL DATA:  Pain and swelling. EXAM: CT OF THE RIGHT FOOT WITHOUT CONTRAST TECHNIQUE: Multidetector CT imaging of the right foot was performed according to the standard protocol. Multiplanar CT image reconstructions were also generated. COMPARISON:  Radiographs 04/09/2020 FINDINGS: Diffuse subcutaneous soft tissue swelling/edema/fluid involving the entire ankle and foot. No focal fluid collection to suggest a drainable soft tissue abscess. Marked fatty atrophy of the foot and ankle musculature. Suspect myofasciitis involving the plantar foot musculature but no definite findings for pyomyositis. Suspect an open wound along the plantar aspect of the great toe but I do not see any obvious destructive bony changes to suggest osteomyelitis. No definite findings for septic  arthritis. The other bony structures of the foot and ankle appear intact. No obvious destructive bony changes, fractures or osteochondral lesions. Extensive small artery calcifications. IMPRESSION: 1. Diffuse subcutaneous soft tissue swelling/edema/fluid involving the entire ankle and foot consistent with cellulitis. No focal fluid collection to suggest a drainable soft tissue abscess. 2. Suspect myofasciitis involving the plantar foot musculature but no definite findings for pyomyositis. Severe diffuse fatty atrophy of the foot and ankle musculature. 3. Suspect an open wound along the plantar aspect of the great toe but I do not see any obvious destructive bony changes to suggest osteomyelitis. No definite findings for septic arthritis. 4. If symptoms persist or worsen MRI is suggested for further evaluation. Electronically Signed   By: Marijo Sanes M.D.   On: 04/12/2020 10:51   DG ABD ACUTE 2+V W 1V CHEST  Result Date: 04/09/2020 CLINICAL DATA:  Fever with low hemoglobin. EXAM: DG ABDOMEN ACUTE W/ 1V  CHEST COMPARISON:  Abdominal film 06/26/2018 and chest x-ray 02/24/2020 FINDINGS: Right IJ central venous catheter unchanged with tip over the SVC. Lungs are adequately inflated with minimal hazy prominence of the perihilar vessels unchanged and likely due to mild chronic vascular congestion. No lobar consolidation or effusion. Mild stable cardiomegaly. Remainder the chest is unchanged. There are several air-filled large and small bowel loops. A few air-filled small bowel loops at the upper limits of normal in diameter over the left abdomen. No evidence of air-fluid levels or free peritoneal air. Remainder of the exam is unchanged. IMPRESSION: Nonspecific, nonobstructive bowel gas pattern with a few air-filled mildly prominent, but nondilated small bowel loops in the left abdomen. Stable mild cardiomegaly with suggestion of minimal chronic vascular congestion. Electronically Signed   By: Marin Olp M.D.   On:  04/09/2020 09:57   DG Foot Complete Left  Result Date: 04/20/2020 CLINICAL DATA:  Recent amputation. Suspect sepsis. Bilateral foot infection 3 weeks. EXAM: LEFT FOOT - COMPLETE 3+ VIEW COMPARISON:  04/09/2020 FINDINGS: There is generalized decreased bone mineralization. Evidence of patient's recent amputation distal to the distal shaft of the first metatarsal. Expected postsurgical changes at the amputation site. No focal bone destruction to suggest osteomyelitis. No air within the soft tissues. Exam is otherwise unchanged. IMPRESSION: Expected changes post amputation distal to the distal aspect of the first metatarsal. No definite bone destruction to suggest osteomyelitis. No air within the soft tissues. Electronically Signed   By: Marin Olp M.D.   On: 04/19/2020 14:58   DG Foot Complete Left  Result Date: 04/09/2020 CLINICAL DATA:  Foot pain and swelling EXAM: LEFT FOOT - COMPLETE 3+ VIEW COMPARISON:  02/01/2020 MRI, 01/31/2020 plain film FINDINGS: There is been interval amputation of the first toe. Persistent soft tissue wound is seen. There is lucency identified in the head of the first metatarsal suspicious for recurrent osteomyelitis. No other fracture or dislocation is seen. No other soft tissue abnormality is noted. IMPRESSION: Changes suspicious for osteomyelitis in the head of the first metatarsal. Overlying soft tissue wound is noted. Electronically Signed   By: Inez Catalina M.D.   On: 04/09/2020 19:40   DG Foot Complete Right  Result Date: 04/07/2020 CLINICAL DATA:  Recent amputation with possible infection three weeks. Suspect sepsis. EXAM: RIGHT FOOT COMPLETE - 3+ VIEW COMPARISON:  CT 04/12/2020 FINDINGS: Diffuse decreased bone mineralization. Degenerative change over the first MTP joint and interphalangeal joints. Mild horizontal sclerosis with subtle lucency involving the head of the fifth metatarsal possibly subacute fracture. No focal bone destruction to suggest osteomyelitis. No  air within the soft tissues. IMPRESSION: 1. No definite evidence of osteomyelitis and no air within the soft tissues. 2. Linear sclerosis with subtle lucency involving the head of the fifth metatarsal which may be due to nondisplaced subacute fracture. Electronically Signed   By: Marin Olp M.D.   On: 03/29/2020 14:56   VAS [place] LOWER EXTREMITY VENOUS (DVT)  Result Date: 04/10/2020  Lower Venous DVTStudy Indications: Edema, and fever.  Limitations: Body habitus and poor ultrasound/tissue interface. Comparison Study: 04/28/19 previous Performing Technologist: Abram Sander RVS  Examination Guidelines: A complete evaluation includes B-mode imaging, spectral Doppler, color Doppler, and power Doppler as needed of all accessible portions of each vessel. Bilateral testing is considered an integral part of a complete examination. Limited examinations for reoccurring indications may be performed as noted. The reflux portion of the exam is performed with the patient in reverse Trendelenburg.  +---------+---------------+---------+-----------+----------+--------------+ RIGHT  CompressibilityPhasicitySpontaneityPropertiesThrombus Aging +---------+---------------+---------+-----------+----------+--------------+ CFV      Full           Yes      Yes                                 +---------+---------------+---------+-----------+----------+--------------+ SFJ      Full                                                        +---------+---------------+---------+-----------+----------+--------------+ FV Prox  Full                                                        +---------+---------------+---------+-----------+----------+--------------+ FV Mid   Full                                                        +---------+---------------+---------+-----------+----------+--------------+ FV DistalFull                                                         +---------+---------------+---------+-----------+----------+--------------+ PFV      Full                                                        +---------+---------------+---------+-----------+----------+--------------+ POP      Full           Yes      Yes                                 +---------+---------------+---------+-----------+----------+--------------+ PTV      Full                                                        +---------+---------------+---------+-----------+----------+--------------+ PERO                                                  Not visualized +---------+---------------+---------+-----------+----------+--------------+   +---------+---------------+---------+-----------+----------+--------------+ LEFT     CompressibilityPhasicitySpontaneityPropertiesThrombus Aging +---------+---------------+---------+-----------+----------+--------------+ CFV      Full           Yes      Yes                                 +---------+---------------+---------+-----------+----------+--------------+  SFJ      Full                                                        +---------+---------------+---------+-----------+----------+--------------+ FV Prox  Full                                                        +---------+---------------+---------+-----------+----------+--------------+ FV Mid   Full                                                        +---------+---------------+---------+-----------+----------+--------------+ FV DistalFull                                                        +---------+---------------+---------+-----------+----------+--------------+ PFV      Full                                                        +---------+---------------+---------+-----------+----------+--------------+ POP      Full           Yes      Yes                                  +---------+---------------+---------+-----------+----------+--------------+ PTV      Full                                                        +---------+---------------+---------+-----------+----------+--------------+ PERO                                                  Not visualized +---------+---------------+---------+-----------+----------+--------------+     Summary: BILATERAL: - No evidence of deep vein thrombosis seen in the lower extremities, bilaterally.   *See table(s) above for measurements and observations. Electronically signed by Deitra Mayo MD on 04/10/2020 at 3:24:49 PM.    Final    CT Angio Abd/Pel w/ and/or w/o  Result Date: 05/04/2020 CLINICAL DATA:  GI bleed. Persistent bleeding. EGD and colonoscopy were negative. EXAM: CTA ABDOMEN AND PELVIS WITHOUT AND WITH CONTRAST TECHNIQUE: Multidetector CT imaging of the abdomen and pelvis was performed using the standard protocol during bolus administration of intravenous contrast. Multiplanar reconstructed images and MIPs were obtained and reviewed to evaluate the vascular anatomy. CONTRAST:  116m OMNIPAQUE IOHEXOL 350 MG/ML SOLN COMPARISON:  CT dated May 01, 2020 FINDINGS: VASCULAR Aorta: Again noted are atherosclerotic changes of the abdominal aorta with an infrarenal abdominal aortic aneurysm measuring approximately 3-3.2 cm. This is stable in appearance from prior study. Celiac: Atherosclerotic changes are noted of the celiac axis likely resulting in mild stenosis. SMA: Heavy calcifications are noted throughout the SMA likely resulting in tandem mild-to-moderate areas of stenosis. Renals: There are heavy calcifications at the origin of both renal arteries likely resulting in some degree of stenosis. IMA: The IMA is patent. Inflow: There is ectasia of the bilateral common iliac arteries as before. Proximal Outflow: Heavy atherosclerotic changes are noted. Veins: No obvious venous abnormality within the limitations of this  arterial phase study. Review of the MIP images confirms the above findings. NON-VASCULAR Lower chest: There is atelectasis at the lung bases.The heart is enlarged. There is reflux of contrast into the IVC. Hepatobiliary: The liver is normal. There appears to be gallbladder sludge or small stones without CT evidence for cholecystitis.There is no biliary ductal dilation. Pancreas: Normal contours without ductal dilatation. No peripancreatic fluid collection. Spleen: There is a small cystic structure in the spleen, of doubtful clinical significance. Adrenals/Urinary Tract: --Adrenal glands: Unremarkable. --Right kidney/ureter: The right kidney is atrophic without evidence for hydronephrosis. There may be a nonobstructing stone in the lower pole. --Left kidney/ureter: The left kidney is atrophic without evidence for hydronephrosis. --Urinary bladder: There is a moderate amount of gas within the urinary bladder which may related to prior instrumentation. Stomach/Bowel: --Stomach/Duodenum: There is a small to moderate-sized hiatal hernia. The patient's reported small-bowel capsule is likely lodged proximal to the GE junction within the patient's hernia. --Small bowel: Unremarkable. --Colon: Liquid stool is noted within the patient's colon. This may be related to prior colonoscopy preparation. There is no evidence for active GI bleeding. A few clips are noted in the ascending colon, likely related to recent colonoscopy. --Appendix: Normal. Lymphatic: --No retroperitoneal lymphadenopathy. --No mesenteric lymphadenopathy. --No pelvic or inguinal lymphadenopathy. Reproductive: There is a 2.5 cm fluid attenuation structure that appears to be at the level of the proximal vagina/urethra. This is of doubtful clinical significance and may represent a Bartholin gland cyst or Skene duct cyst. Other: No ascites or free air. The abdominal wall is normal. Musculoskeletal. No acute displaced fractures. IMPRESSION: 1. No evidence for  active GI bleeding. If there is slow active GI beading, a tagged red blood cell scan would be more sensitive for localization. 2. There is a small to moderate size hiatal hernia. The patient's reported small-bowel capsule is lodged within this hernia. 3. Cardiomegaly with reflux of contrast into the IVC, suggestive of right heart failure. 4. Atrophic kidneys. 5. There is a moderate amount of gas within the urinary bladder which may be related to prior instrumentation. 6. Stable vascular findings as described on the patient's CT from May 01, 2020. Aortic Atherosclerosis (ICD10-I70.0). Electronically Signed   By: Constance Holster M.D.   On: 05/04/2020 19:11   CT Angio Abd/Pel w/ and/or w/o  Result Date: 05/02/2020 CLINICAL DATA:  69 year old female currently COVID-19 positive with bleeding per rectum concerning for lower GI bleed. EXAM: CTA ABDOMEN AND PELVIS WITHOUT AND WITH CONTRAST TECHNIQUE: Multidetector CT imaging of the abdomen and pelvis was performed using the standard protocol during bolus administration of intravenous contrast. Multiplanar reconstructed images and MIPs were obtained and reviewed to evaluate the vascular anatomy. CONTRAST:  154m OMNIPAQUE IOHEXOL 350 MG/ML SOLN COMPARISON:  Prior CT scan of the abdomen and pelvis 08/16/2019 FINDINGS:  VASCULAR Aorta: Extensive heterogeneous mixed calcified and fibrofatty atherosclerotic plaque throughout the abdominal aorta. The infrarenal aorta is mildly aneurysmal at a maximum of 3 cm in diameter. Celiac: Calcified atherosclerotic plaque at the origin without significant stenosis. No aneurysm or dissection. SMA: Calcified atherosclerotic plaque results in at least moderate stenosis of the proximal SMA. Fibrofatty atherosclerotic plaque results in focal moderate stenosis in the mid SMA. Renals: There are 2 renal arteries bilaterally. All for renal arteries are heavily diseased with mixed calcified and fibrofatty atherosclerotic plaque. At least moderate  stenoses present bilaterally worse on the left than the right. IMA: Heavily diseased with high-grade stenosis at the origin. The vessel opacifies distally. Inflow: Mixed calcified and fibrofatty atherosclerotic plaque extends into the inflow vessels bilaterally. Both common iliac arteries are ectatic measuring up to 1.8 cm on the left and 2.1 cm on the right. Focal moderate stenoses at the origins of the internal iliac arteries. 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 No significant stenosis or occlusion of the external iliac arteries. Proximal Outflow: Heavily diseased bilaterally. Moderate to high-grade stenosis of the right common femoral artery. Bilateral profunda femoral arteries are heavily diseased bilaterally. High-grade stenosis bordering on occlusion of the proximal right superficial femoral artery. Veins: No focal venous abnormality. Review of the MIP images confirms the above findings. NON-VASCULAR Lower chest: The visualized cardiac structures are normal in size. No pericardial effusion. Moderately large hiatal hernia. The lower lungs are clear. Hepatobiliary: Normal hepatic contour and morphology. Geographic hypoattenuation in the left hemi-liver adjacent to the fissure for the falciform ligament is nonspecific but most suggestive of benign focal fatty infiltration. No discrete solid hepatic lesion. Gallbladder is unremarkable. No intra or extrahepatic biliary ductal dilatation. Pancreas: Unremarkable. No pancreatic ductal dilatation or surrounding inflammatory changes. Spleen: 0.9 cm circumscribed low-attenuation lesion in the posteromedial aspect of the spleen is too small for accurate characterization. Statistically, this is almost certainly a benign lesion such as a cyst. Adrenals/Urinary Tract: Normal adrenal glands. Renal atrophy bilaterally worse on the left than the right likely secondary to chronic ischemia related to renal artery stenoses. 1.0 cm  low-attenuation lesion in the right interpolar kidney is too small for accurate characterization but remains unchanged compared to prior studies and is almost certainly benign. Right lower pole renal sinus cyst, stable. Air-fluid level present within the bladder. Additionally, there is a semi lunar collection of fluid surrounding the urethra. Stomach/Bowel: No evidence of contrast extravasation into bowel on either the arterial or portal venous phase. Negative for acute bleeding at this time. No significant diverticular disease. Stomach and duodenum are unremarkable. No evidence of bowel wall thickening or obstruction. A metallic clip is present in the ascending colon. Lymphatic: No suspicious lymphadenopathy. Reproductive: Dystrophic calcified uterine fibroid. No adnexal masses. Other: No significant abdominal wall hernia. No evidence of ascites. Musculoskeletal: No acute fracture or aggressive appearing lytic or blastic osseous lesion. Lower lumbar degenerative disc disease most notable at L5-S1. IMPRESSION: VASCULAR 1. No evidence of active GI bleeding. 2. 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 Of note, the pattern of disease is not suggestive of chronic mesenteric ischemia. 3. 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 4. 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 5. 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 Recommend followup by ultrasound in 3 years.  This recommendation follows ACR consensus guidelines: White Paper of the ACR Incidental Findings Committee II on Vascular Findings. J Am Coll Radiol 2013; 10:789-794. Aortic aneurysm NOS (ICD10-I71.9); Aortic Atherosclerosis (ICD10-I70.0). NON-VASCULAR 1. No evidence of bowel wall thickening, inflammation or significant diverticulosis. 2. Metallic clip visible in the ascending  colon. 3. Bilateral renal atrophy, likely secondary to chronic ischemia related renal artery stenoses. 4. Air-fluid level in the bladder is presumably related to recent instrumentation. If the patient has not been recently catheterized, then cystitis with a gas-forming organism would be a consideration. 5. Urethrocele. 6. Moderate hiatal hernia. 7. Lower lumbar degenerative disc disease. Electronically Signed   By: Jacqulynn Cadet M.D.   On: 05/02/2020 08:47

## 2020-05-06 ENCOUNTER — Inpatient Hospital Stay (HOSPITAL_COMMUNITY): Payer: Medicare (Managed Care)

## 2020-05-06 ENCOUNTER — Encounter (HOSPITAL_COMMUNITY): Payer: Self-pay | Admitting: Certified Registered Nurse Anesthetist

## 2020-05-06 DIAGNOSIS — D500 Iron deficiency anemia secondary to blood loss (chronic): Secondary | ICD-10-CM

## 2020-05-06 LAB — RENAL FUNCTION PANEL
Albumin: 1.9 g/dL — ABNORMAL LOW (ref 3.5–5.0)
Anion gap: 16 — ABNORMAL HIGH (ref 5–15)
BUN: 13 mg/dL (ref 8–23)
CO2: 23 mmol/L (ref 22–32)
Calcium: 7.7 mg/dL — ABNORMAL LOW (ref 8.9–10.3)
Chloride: 100 mmol/L (ref 98–111)
Creatinine, Ser: 3.94 mg/dL — ABNORMAL HIGH (ref 0.44–1.00)
GFR calc Af Amer: 13 mL/min — ABNORMAL LOW (ref >60)
GFR calc non Af Amer: 11 mL/min — ABNORMAL LOW (ref >60)
Glucose, Bld: 106 mg/dL — ABNORMAL HIGH (ref 70–99)
Phosphorus: 2.6 mg/dL (ref 2.5–4.6)
Potassium: 3.6 mmol/L (ref 3.5–5.1)
Sodium: 139 mmol/L (ref 135–145)

## 2020-05-06 LAB — GLUCOSE, CAPILLARY
Glucose-Capillary: 135 mg/dL — ABNORMAL HIGH (ref 70–99)
Glucose-Capillary: 147 mg/dL — ABNORMAL HIGH (ref 70–99)
Glucose-Capillary: 89 mg/dL (ref 70–99)
Glucose-Capillary: 93 mg/dL (ref 70–99)
Glucose-Capillary: 98 mg/dL (ref 70–99)

## 2020-05-06 LAB — CBC
HCT: 25.4 % — ABNORMAL LOW (ref 36.0–46.0)
Hemoglobin: 8 g/dL — ABNORMAL LOW (ref 12.0–15.0)
MCH: 28 pg (ref 26.0–34.0)
MCHC: 31.5 g/dL (ref 30.0–36.0)
MCV: 88.8 fL (ref 80.0–100.0)
Platelets: 242 10*3/uL (ref 150–400)
RBC: 2.86 MIL/uL — ABNORMAL LOW (ref 3.87–5.11)
RDW: 18.5 % — ABNORMAL HIGH (ref 11.5–15.5)
WBC: 7.6 10*3/uL (ref 4.0–10.5)
nRBC: 3.7 % — ABNORMAL HIGH (ref 0.0–0.2)

## 2020-05-06 MED ORDER — GERHARDT'S BUTT CREAM
TOPICAL_CREAM | Freq: Two times a day (BID) | CUTANEOUS | Status: DC | PRN
  Filled 2020-05-06: qty 1

## 2020-05-06 MED ORDER — SUCROFERRIC OXYHYDROXIDE 500 MG PO CHEW
500.0000 mg | CHEWABLE_TABLET | Freq: Three times a day (TID) | ORAL | Status: DC
  Filled 2020-05-06 (×4): qty 1

## 2020-05-06 NOTE — Progress Notes (Addendum)
Progress Note  CC:   "I want to eat"     ASSESSMENT AND PLAN:   Obscure GI bleeding with intermittent hematochezia / anemia requiring transfusions --no source identified on SBE or colonoscopy  --blood seen throughout the colon on colonoscopy 05/14/2020 --Capsule camera remained in hiatal hernia during prior attempt at VCE 05/02/2020 --No source identified on CTA 05/04/2020 --Unable to proceed with repeat VCE with capsule placed into duodenum 05/24/2020 - patient refused prep --Tagged bleeding scan showed no active bleeding 05/07/2020 --Continued to refuse prep such that 05/06/20 procedure was cancelled GI blood loss anemia --Hgb stable since last transfusion 05/24/2020, 8.0 today Retained video capsule in hiatal hernia History of colonic AVMs - last seen and treated 03/2020, not seen on most recent colonoscpy ESRD COVID 19 asymptomatic Awaiting possible amputation after bleeding source has been identified  Unable to proceed with planned EGD to retrieve prior capsule pill and place new capsule pill into the duodenum because the patient refused to drink the prep. I again reviewed indications for the study and she agrees to drink the Moviprep tonight.  NPO at midnight in preparation for EGD with placement of capsule into the small bowel 05/21/2020. Follow-up abdominal films today to reassess the location of the last capsule. If it is still present at the time of EGD, will plan to retrieve it. Continue serial hgb/hct with transfusion as indicated in the meantime.   I updated her aunt, Tammara Massing, by phone. She will encourage [name] [name] to prepare for the repeat study today.      SUBJECTIVE   Overt bleeding yesterday. By the time tagged bleeding scan was performed, bleeding had stopped. Drank less than 4 ounces of Miralax last night. Wants to eat. Denies any additional overt GI blood loss.  Again agrees to prep for the EGD with capsule placement.  No family or nursing present at the time of my  evaluation.   OBJECTIVE:     Vital signs in last 24 hours: Temp:  [98.3 F (36.8 C)-99.4 F (37.4 C)] 99.2 F (37.3 C) (05/09 0400) Pulse Rate:  [63-80] 76 (05/09 0400) Resp:  [13-21] 21 (05/09 0400) BP: (103-154)/(42-119) 130/119 (05/09 0400) SpO2:  [93 %-100 %] 98 % (05/09 0400) Weight:  [111.4 kg-111.9 kg] 111.4 kg (05/09 0400) Last BM Date: 05/06/20 General:   Alert, in NAD, I awoke her at the time of my evaluation. Abdomen:  Soft,  nontender, nondistended.  Normal bowel sounds.          Neurologic:  Alert and  oriented,  grossly normal neurologically. Psych:  Pleasant, cooperative.  Normal mood and affect.   Lab Results: Recent Labs    05/04/20 1548 05/08/2020 0840 05/06/20 0632  WBC 7.4 7.0 7.6  HGB 7.4* 7.3* 8.0*  HCT 25.2* 24.0* 25.4*  PLT 287 295 242   BMET Recent Labs    05/04/20 0630 04/29/2020 0840 05/06/20 0632  NA 139 139 139  K 4.0 3.8 3.6  CL 100 102 100  CO2 25 22 23   GLUCOSE 125* 90 106*  BUN 8 18 13   CREATININE 4.10* 5.86* 3.94*  CALCIUM 7.4* 7.6* 7.7*   LFT Recent Labs    05/06/20 1607  ALBUMIN 1.9*   ABDOMEN - 1 VIEW 05/04/20 IMPRESSION: 17 mm metallic barrel-shaped opacity projects over the expected location of distal esophagus or GE junction. No additional metallic radiopaque foreign bodies are visible over the included abdomen or pelvis.  CTA Abd/Pelvis 05/04/20 IMPRESSION: 1. No evidence  for active GI bleeding. If there is slow active GI beading, a tagged red blood cell scan would be more sensitive for localization. 2. There is a small to moderate size hiatal hernia. The patient's reported small-bowel capsule is lodged within this hernia. 3. Cardiomegaly with reflux of contrast into the IVC, suggestive of right heart failure. 4. Atrophic kidneys. 5. There is a moderate amount of gas within the urinary bladder which may be related to prior instrumentation. 6. Stable vascular findings as described on the patient's CT from May 01, 2020.   Th[place], MD 05/06/2020, 10:03 AM

## 2020-05-06 NOTE — Progress Notes (Signed)
Received call from Dr. Candiss Norse.  Notified him that patient continues to refuse prep.  Is currently NPO.  Md gave verbal order for clear liquids.  Will discuss plan for goals of care with patient and family tomorrow.

## 2020-05-06 NOTE — Progress Notes (Signed)
Fecal bag emptied for 375 ml black type 7 stool with bright red streaks.  Has striking odor similar to GI bleed.  MD notified.  Pt appears asymptomatic at this time.

## 2020-05-06 NOTE — Progress Notes (Addendum)
PROGRESS NOTE                                                                                                                                                                                                             Patient Demographics:    [name] [name], is a 69 y.o. female, DOB - [birth year], YIR:[id number]  Outpatient Primary MD for the patient is [place] date - 04/27/2020   LOS - 11  Chief Complaint  Patient presents with  . Altered Mental Status       Brief Narrative: Patient is a 69 y.o. 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  She was also incidentally found to have COVID-19.  Hospital course complicated by recurrent GI bleeding of obscure etiology-see below.  Significant Events: 4/6-4/22>> admit to [facility] for upper GI bleeding and blood loss anemia, left great toe osteo s/p amputation 4/28>> admit to [facility] for acute metabolic encephalopathy 5/00>> refused to sign consent for angiogram/amputation-rescheduled for 5/3 4/30>> bloody stools  5/2>> colonoscopy postponed-incomplete prep-due to refusal to complete bowel prep 5/3>> colonoscopy postponed-incomplete prep-due to refusal to complete bowel prep, lower extremity angiogram postponed-due to GI bleeding. 5/4>> colonoscopy- residual blood in the entire colon 5/6>> capsule endoscopy-unfortunately capsule retained in the stomach 5/8>> EGD with capsule placement in small bowel postponed as patient refused to drink prep on 5/7 pm 5/8>> tagged RBC scan negative  Procedures/studies: 5/7>> repeat CT mesenteric angiogram: No active bleeding source 5/6>>capsule  endoscopy>> capsule retained in the stomach 5/4>> colonoscopy-residual blood throughout: 5/4>> CT mesenteric angiogram: No active bleeding source 4/14>> left great toe amputation 4/10>> EGD/small bowel enteroscopy>> AVM third portion of duodenum  COVID-19 medications: Remdesivir: 4/28>> 4/29  Antibiotics: Vancomycin: 4/28>>5/1 Zosyn: 4/28>>5/7  Microbiology data: 4/28: Blood culture>> no growth 4/28: Wound swab/superficial culture>> Pseudomonas  DVT prophylaxis: Hold Heparin 4/30 2/2 GI bleed - SCDs  Consults: VVS, renal,GI    Subjective:   Patient in bed, appears comfortable, denies any headache, no fever, no chest pain or pressure, no shortness of breath , no abdominal pain.  No focal weakness.    Assessment  & Plan :   Acute metabolic encephalopathy: Likely secondary to SIRS/right foot gangrene-uremia due to missed HD.  Significantly improved after treatment of SIRS/foot infection with gangrene and dialysis-now back to baseline.  Sepsis with right lower extremity critical limb ischemia: Afebrile- sepsis physiology has resolved-has gangrenous changes evident on exam-please see pictures taken on 4/28 and prior notes.  Blood cultures negative so far.    Superficial swab on admission positive for Pseudomonas-not sure how accurate this is.  Has completed a 10-day course of Zosyn with plans to watch off antibiotics.   Refused angiogram/amputation on 4/29-was rescheduled for 5/3-however with her ongoing GI bleeding-angiogram postponed for now. Very difficult situation-this patient has known history of AVMs and history of recurrent  GI bleeding requiring PRBC transfusion-and may not be able to tolerate long-term dual antiplatelet therapy.    Once GI work-up has been completed-we will need to notify vascular surgery (Dr. Servando Snare) - for angiogram/amputation needs..  History of PAD with recent left great toe amputation-now with nonhealing left foot wound: Will need debridement with  vascular surgery once her GI bleeding has resolved.  No longer on aspirin due to GI bleeding-remains on statin.  Recurrent upper GI bleeding-history of AVMs and acute blood loss anemia: Continues to have stuttering hematochezia/melena-requirement 3 units of PRBC so far last transfusion on 05/06/2020, Colonoscopy revealed changes suggestive of possible small bowel , negative tagged RBC scan on 05/14/2020.  First capsule endoscopy failed as the capsule got stuck in the stomach, plan to do EGD to retrieve old capsule and to place a new capsule in the duodenum however patient has been noncompliant with bowel prep and swallowing the pill, states she is going to do it on 05/06/2020.  Continue to monitor packed RBC.  GI on board.  Continue PPI for now.  Hold antiplatelets.  Hyperkalemia: Secondary to missed HD-resolved with HD.  Follow periodically.  ESRD: Nephrology following and directing care. TTS - HD.  Anemia: Multifactorial-likely secondary to kidney disease-worsened by blood loss-we will transfuse another unit of PRBC today.  HTN: BP relatively well controlled-continue metoprolol  DM-2 with hypoglycemia (A1C 5.1 on 04/03/2020): No further hypoglycemic episodes-continue to hold all insulin for now.  Not a good candidate for tight glycemic control-allow some amount of permissive hyperglycemia.  Recent Labs    05/06/20 0016 05/06/20 0801 05/06/20 1208  GLUCAP 135* 93 98   Paroxysmal atrial fibrillation/atrial flutter: Not a new diagnosis-Per discharge summary in October 2020-she also had atrial fibrillation  as well.  Given recurrent GI bleeding/anemia-not a good candidate for anticoagulation.  Continue rate control with metoprolol.  Chronic combined diastolic and systolic heart failure (EF 45-50% by TTE on 04/28/2019): Volume status stable-diuresis with HD.  History of CVA: Continue statin-see above regarding aspirin and anticoagulation  Gout: Not in flare-continue allopurinol  Covid 19 Viral  infection: Incidental, no symptoms or pneumonia on x-ray, monitor.  O2 requirements:  SpO2: 98 % O2 Flow Rate (L/min): 2 L/min   COVID-19 Labs: No results for input(s): DDIMER, FERRITIN, LDH, CRP in the last 72 hours.     Component Value Date/Time   BNP 897.8 (H) 01/01/2020 0600    No results for input(s): PROCALCITON in the last 168 hours.  Lab Results  Component Value Date   SARSCOV2NAA POSITIVE (A) 03/29/2020   [place] NEGATIVE 04/16/2020   [place] NEGATIVE 04/03/2020   [place] NEGATIVE 03/15/2020     Morbid Obesity: Estimated body mass index is 38.47  kg/m as calculated from the following:   Height as of this encounter: 5\' 7"  (1.702 m).   Weight as of this encounter: 111.4 kg.  Pressure Injury 05/24/2020 Ankle Posterior;Right Unstageable - Full thickness tissue loss in which the base of the injury is covered by slough (yellow, tan, gray, green or brown) and/or eschar (tan, brown or black) in the wound bed. black/brown wound bed o (Active)  05/20/2020 1630  Location: Ankle  Location Orientation: Posterior;Right  Staging: Unstageable - Full thickness tissue loss in which the base of the injury is covered by slough (yellow, tan, gray, green or brown) and/or eschar (tan, brown or black) in the wound bed.  Wound Description (Comments): black/brown wound bed on heel  Present on Admission:     Condition - Guarded  Family Communication  : Previous MD discussed with patient's aunt over the phone 5/8-have asked her to talk to the patient and encourage patient to complete her bowel prep this evening.  Code Status :  Full Code  Diet :  Diet Order            Diet NPO time specified  Diet effective midnight               Disposition Plan  :  Status is: Inpatient  Remains inpatient appropriate because:Inpatient level of care appropriate due to severity of illness  Dispo: The patient is from: SNF              Anticipated d/c is to: SNF              Anticipated  d/c date is: > 3 days              Patient currently is not medically stable to d/c.  Barriers to discharge: Ongoing GI bleeding without obvious source-right foot gangrene on IV antibiotics-probably will require revascularization/amputation once GI bleeding is controlled.  Antimicorbials  :    Anti-infectives (From admission, onward)   Start     Dose/Rate Route Frequency Ordered Stop   04/28/20 1200  vancomycin (VANCOCIN) IVPB 1000 mg/200 mL premix  Status:  Discontinued     1,000 mg 200 mL/hr over 60 Minutes Intravenous Every T-Th-Sa (Hemodialysis) 04/27/20 1154 04/28/20 1218   04/26/20 1400  piperacillin-tazobactam (ZOSYN) IVPB 2.25 g  Status:  Discontinued     2.25 g 100 mL/hr over 30 Minutes Intravenous Every 8 hours 04/26/20 1314 05/04/20 1524   04/26/20 1000  remdesivir 100 mg in sodium chloride 0.9 % 100 mL IVPB  Status:  Discontinued     100 mg 200 mL/hr over 30 Minutes Intravenous Daily 04/10/2020 1952 04/26/20 1105   04/08/2020 2030  remdesivir 200 mg in sodium chloride 0.9% 250 mL IVPB     200 mg 580 mL/hr over 30 Minutes Intravenous Once 04/11/2020 1952 04/18/2020 2327   04/14/2020 1515  vancomycin (VANCOCIN) 2,500 mg in sodium chloride 0.9 % 500 mL IVPB     2,500 mg 250 mL/hr over 120 Minutes Intravenous  Once 04/11/2020 1513 04/13/2020 2026   03/30/2020 1513  vancomycin variable dose per unstable renal function (pharmacist dosing)  Status:  Discontinued      Does not apply See admin instructions 04/16/2020 1514 04/28/20 1218   04/05/2020 1500  piperacillin-tazobactam (ZOSYN) IVPB 3.375 g     3.375 g 100 mL/hr over 30 Minutes Intravenous  Once 03/31/2020 1450 04/16/2020 2026      Inpatient Medications  Scheduled Meds: . sodium chloride   Intravenous Once  .  allopurinol  100 mg Oral Daily  . atorvastatin  40 mg Oral q1800  . buPROPion  150 mg Oral BID  . calcitRIOL  2.25 mcg Oral Q T,Th,Sat-1800  . Chlorhexidine Gluconate Cloth  6 each Topical Q0600  . darbepoetin (ARANESP) injection -  DIALYSIS  150 mcg Intravenous Q Sat-HD  . feeding supplement  1 Container Oral TID BM  . gabapentin  300 mg Oral QHS  . linaclotide  72 mcg Oral QAC breakfast  . loratadine  10 mg Oral Daily  . metoprolol tartrate  25 mg Oral BID  . pantoprazole  40 mg Oral Daily  . polyethylene glycol  17 g Oral BID  . sucroferric oxyhydroxide  500 mg Oral TID WC   Continuous Infusions: . sodium chloride     PRN Meds:.sodium chloride, acetaminophen, albuterol, alteplase, bisacodyl, heparin, lidocaine (PF), lidocaine-prilocaine, metoprolol tartrate, mometasone-formoterol, nitroGLYCERIN, oxyCODONE, pentafluoroprop-tetrafluoroeth, sodium chloride flush   Time Spent in minutes 25   See all Orders from today for further details   Lala Lund M.D on 05/06/2020 at 12:39 PM  To page go to [email or web address] - use universal password  [facility] -  Office  [phone]    Objective:   Vitals:   05/06/20 0031 05/06/20 0218 05/06/20 0400 05/06/20 1102  BP: (!) 120/49 (!) 128/107 (!) 130/119 (!) 125/56  Pulse: 72 71 76 78  Resp: 20 (!) 21 (!) 21 16  Temp: 99.4 F (37.4 C) 98.9 F (37.2 C) 99.2 F (37.3 C)   TempSrc: Axillary Axillary Oral   SpO2: 100% 93% 98% 98%  Weight:   111.4 kg   Height:        Wt Readings from Last 3 Encounters:  05/06/20 111.4 kg  04/19/20 118.4 kg  03/02/20 120.2 kg     Intake/Output Summary (Last 24 hours) at 05/06/2020 1239 Last data filed at 05/06/2020 0300 Gross per 24 hour  Intake 30 ml  Output 0 ml  Net 30 ml    Physical Exam  Awake Alert, No new F.N deficits, Normal affect [place].AT,PERRAL Supple Neck,No JVD, No cervical lymphadenopathy appriciated.  Symmetrical Chest wall movement, Good air movement bilaterally, CTAB RRR,No Gallops, Rubs or new Murmurs, No Parasternal Heave +ve B.Sounds, Abd Soft, No tenderness, No organomegaly appriciated, No rebound - guarding or rigidity. No Cyanosis, both feet wrapped under bandage    Data Review:     CBC Recent Labs  Lab 05/11/2020 0256 05/04/20 0630 05/04/20 1548 05/11/2020 0840 05/06/20 0632  WBC 6.6 6.2 7.4 7.0 7.6  HGB 7.0* 7.3* 7.4* 7.3* 8.0*  HCT 22.7* 24.1* 25.2* 24.0* 25.4*  PLT 293 269 287 295 242  MCV 87.6 88.6 91.0 91.6 88.8  MCH 27.0 26.8 26.7 27.9 28.0  MCHC 30.8 30.3 29.4* 30.4 31.5  RDW 19.1* 19.0* 19.1* 19.4* 18.5*    Chemistries  Recent Labs  Lab 05/02/20 0749 05/11/2020 0256 05/04/20 0630 05/03/2020 0840 05/06/20 0632  NA 138 137 139 139 139  K 4.3 3.8 4.0 3.8 3.6  CL 99 101 100 102 100  CO2 25 25 25 22 23   GLUCOSE 74 93 125* 90 106*  BUN 15 19 8 18 13   CREATININE 5.08* 6.48* 4.10* 5.86* 3.94*  CALCIUM 7.7* 7.2* 7.4* 7.6* 7.7*   ------------------------------------------------------------------------------------------------------------------ No results for input(s): CHOL, HDL, LDLCALC, TRIG, CHOLHDL, LDLDIRECT in the last 72 hours.  Lab Results  Component Value Date   HGBA1C 5.1 04/03/2020   ------------------------------------------------------------------------------------------------------------------ No results for input(s): TSH, T4TOTAL, T3FREE, THYROIDAB in the last  72 hours.  Invalid input(s): FREET3 ------------------------------------------------------------------------------------------------------------------ No results for input(s): VITAMINB12, FOLATE, FERRITIN, TIBC, IRON, RETICCTPCT in the last 72 hours.  Coagulation profile No results for input(s): INR, PROTIME in the last 168 hours.  No results for input(s): DDIMER in the last 72 hours.  Cardiac Enzymes No results for input(s): CKMB, TROPONINI, MYOGLOBIN in the last 168 hours.  Invalid input(s): CK ------------------------------------------------------------------------------------------------------------------    Component Value Date/Time   BNP 897.8 (H) 01/01/2020 0600    Micro Results No results found for this or any previous visit (from the past 240  hour(s)).  Radiology Reports DG Chest 2 View  Result Date: 04/26/2020 CLINICAL DATA:  69 year old female with suspected sepsis. Bilateral foot infection for the past 3 weeks. EXAM: CHEST - 2 VIEW COMPARISON:  Chest x-ray 04/04/2020. FINDINGS: Lung volumes are normal. No consolidative airspace disease. No pleural effusions. No evidence of frank pulmonary edema. Mild cephalization of the pulmonary vasculature. Mild cardiomegaly. Upper mediastinal contours are within normal limits. Aortic atherosclerosis. Status post median sternotomy for CABG. Right internal jugular PermCath with tip terminating in the mid to distal superior vena cava. Aortic atherosclerosis. IMPRESSION: 1. Postoperative changes and support apparatus, as above. 2. Mild cardiomegaly with pulmonary venous congestion, without frank pulmonary edema. 3. Aortic atherosclerosis. Electronically Signed   By: Vinnie Langton M.D.   On: 04/17/2020 14:52   DG Abd 1 View  Result Date: 05/04/2020 CLINICAL DATA:  Possible retained small-bowel capsule EXAM: ABDOMEN - 1 VIEW COMPARISON:  04/10/2020, CT 05/14/2020 FINDINGS: Nonobstructed gas pattern. Surgical clips in the right lower quadrant. Calcified pelvic phleboliths. Barrel-shaped metallic opacity measuring 17 mm projecting over expected location of distal esophagus/GE junction. IMPRESSION: 1. 17 mm metallic barrel-shaped opacity projects over the expected location of distal esophagus or GE junction. No additional metallic radiopaque foreign bodies are visible over the included abdomen or pelvis. Electronically Signed   By: Donavan Foil M.D.   On: 05/04/2020 17:39   NM GI Blood Loss  Result Date: 05/22/2020 CLINICAL DATA:  Hemoglobin with large bloody stools. EXAM: NUCLEAR MEDICINE GASTROINTESTINAL BLEEDING SCAN TECHNIQUE: Sequential abdominal images were obtained following intravenous administration of Tc-22m labeled red blood cells. RADIOPHARMACEUTICALS:  26.2 mCi Tc-58m pertechnetate in-vitro  labeled red cells. COMPARISON:  None. FINDINGS: It should be noted that the study is limited secondary to an extensive amount of patient motion. Normal accumulation of tracer is seen within the vascular system. No areas of abnormal tracer uptake are seen within the large and small bowel. Tracer activity is seen extending across the right abdomen which represents contaminated tracer uptake within the patient's right hand. IMPRESSION: 1. Limited study, as described above, without evidence of active gastrointestinal bleeding at the time of the exam. Electronically Signed   By: Virgina Norfolk M.D.   On: 05/02/2020 23:36   CT FOOT RIGHT WO CONTRAST  Result Date: 04/12/2020 CLINICAL DATA:  Pain and swelling. EXAM: CT OF THE RIGHT FOOT WITHOUT CONTRAST TECHNIQUE: Multidetector CT imaging of the right foot was performed according to the standard protocol. Multiplanar CT image reconstructions were also generated. COMPARISON:  Radiographs 04/09/2020 FINDINGS: Diffuse subcutaneous soft tissue swelling/edema/fluid involving the entire ankle and foot. No focal fluid collection to suggest a drainable soft tissue abscess. Marked fatty atrophy of the foot and ankle musculature. Suspect myofasciitis involving the plantar foot musculature but no definite findings for pyomyositis. Suspect an open wound along the plantar aspect of the great toe but I do not see any obvious destructive bony changes to suggest  osteomyelitis. No definite findings for septic arthritis. The other bony structures of the foot and ankle appear intact. No obvious destructive bony changes, fractures or osteochondral lesions. Extensive small artery calcifications. IMPRESSION: 1. Diffuse subcutaneous soft tissue swelling/edema/fluid involving the entire ankle and foot consistent with cellulitis. No focal fluid collection to suggest a drainable soft tissue abscess. 2. Suspect myofasciitis involving the plantar foot musculature but no definite findings for  pyomyositis. Severe diffuse fatty atrophy of the foot and ankle musculature. 3. Suspect an open wound along the plantar aspect of the great toe but I do not see any obvious destructive bony changes to suggest osteomyelitis. No definite findings for septic arthritis. 4. If symptoms persist or worsen MRI is suggested for further evaluation. Electronically Signed   By: Marijo Sanes M.D.   On: 04/12/2020 10:51   DG ABD ACUTE 2+V W 1V CHEST  Result Date: 04/09/2020 CLINICAL DATA:  Fever with low hemoglobin. EXAM: DG ABDOMEN ACUTE W/ 1V CHEST COMPARISON:  Abdominal film 06/26/2018 and chest x-ray 02/24/2020 FINDINGS: Right IJ central venous catheter unchanged with tip over the SVC. Lungs are adequately inflated with minimal hazy prominence of the perihilar vessels unchanged and likely due to mild chronic vascular congestion. No lobar consolidation or effusion. Mild stable cardiomegaly. Remainder the chest is unchanged. There are several air-filled large and small bowel loops. A few air-filled small bowel loops at the upper limits of normal in diameter over the left abdomen. No evidence of air-fluid levels or free peritoneal air. Remainder of the exam is unchanged. IMPRESSION: Nonspecific, nonobstructive bowel gas pattern with a few air-filled mildly prominent, but nondilated small bowel loops in the left abdomen. Stable mild cardiomegaly with suggestion of minimal chronic vascular congestion. Electronically Signed   By: Marin Olp M.D.   On: 04/09/2020 09:57   DG Abd Portable 1V  Result Date: 05/06/2020 CLINICAL DATA:  Altered mental status. COVID-19 positive. Apparent recurrent upper gastrointestinal bleeding EXAM: PORTABLE ABDOMEN - 1 VIEW COMPARISON:  May 04, 2020. FINDINGS: Previously noted radiopaque foreign body is now in or overlying the distal small bowel in the right upper pelvic region. There is no bowel dilatation or air-fluid level to suggest bowel obstruction. No free air. There are apparent  phleboliths in the pelvis. IMPRESSION: Metallic foreign body in or overlying the distal small bowel region. No bowel obstruction or free air. Electronically Signed   By: Lowella Grip III M.D.   On: 05/06/2020 11:13   DG Foot Complete Left  Result Date: 04/09/2020 CLINICAL DATA:  Recent amputation. Suspect sepsis. Bilateral foot infection 3 weeks. EXAM: LEFT FOOT - COMPLETE 3+ VIEW COMPARISON:  04/09/2020 FINDINGS: There is generalized decreased bone mineralization. Evidence of patient's recent amputation distal to the distal shaft of the first metatarsal. Expected postsurgical changes at the amputation site. No focal bone destruction to suggest osteomyelitis. No air within the soft tissues. Exam is otherwise unchanged. IMPRESSION: Expected changes post amputation distal to the distal aspect of the first metatarsal. No definite bone destruction to suggest osteomyelitis. No air within the soft tissues. Electronically Signed   By: Marin Olp M.D.   On: 04/26/2020 14:58   DG Foot Complete Left  Result Date: 04/09/2020 CLINICAL DATA:  Foot pain and swelling EXAM: LEFT FOOT - COMPLETE 3+ VIEW COMPARISON:  02/01/2020 MRI, 01/31/2020 plain film FINDINGS: There is been interval amputation of the first toe. Persistent soft tissue wound is seen. There is lucency identified in the head of the first metatarsal suspicious for recurrent  osteomyelitis. No other fracture or dislocation is seen. No other soft tissue abnormality is noted. IMPRESSION: Changes suspicious for osteomyelitis in the head of the first metatarsal. Overlying soft tissue wound is noted. Electronically Signed   By: Inez Catalina M.D.   On: 04/09/2020 19:40   DG Foot Complete Right  Result Date: 04/13/2020 CLINICAL DATA:  Recent amputation with possible infection three weeks. Suspect sepsis. EXAM: RIGHT FOOT COMPLETE - 3+ VIEW COMPARISON:  CT 04/12/2020 FINDINGS: Diffuse decreased bone mineralization. Degenerative change over the first MTP  joint and interphalangeal joints. Mild horizontal sclerosis with subtle lucency involving the head of the fifth metatarsal possibly subacute fracture. No focal bone destruction to suggest osteomyelitis. No air within the soft tissues. IMPRESSION: 1. No definite evidence of osteomyelitis and no air within the soft tissues. 2. Linear sclerosis with subtle lucency involving the head of the fifth metatarsal which may be due to nondisplaced subacute fracture. Electronically Signed   By: Marin Olp M.D.   On: 04/14/2020 14:56   VAS [place] LOWER EXTREMITY VENOUS (DVT)  Result Date: 04/10/2020  Lower Venous DVTStudy Indications: Edema, and fever.  Limitations: Body habitus and poor ultrasound/tissue interface. Comparison Study: 04/28/19 previous Performing Technologist: Abram Sander RVS  Examination Guidelines: A complete evaluation includes B-mode imaging, spectral Doppler, color Doppler, and power Doppler as needed of all accessible portions of each vessel. Bilateral testing is considered an integral part of a complete examination. Limited examinations for reoccurring indications may be performed as noted. The reflux portion of the exam is performed with the patient in reverse Trendelenburg.  +---------+---------------+---------+-----------+----------+--------------+ RIGHT    CompressibilityPhasicitySpontaneityPropertiesThrombus Aging +---------+---------------+---------+-----------+----------+--------------+ CFV      Full           Yes      Yes                                 +---------+---------------+---------+-----------+----------+--------------+ SFJ      Full                                                        +---------+---------------+---------+-----------+----------+--------------+ FV Prox  Full                                                        +---------+---------------+---------+-----------+----------+--------------+ FV Mid   Full                                                         +---------+---------------+---------+-----------+----------+--------------+ FV DistalFull                                                        +---------+---------------+---------+-----------+----------+--------------+ PFV      Full                                                        +---------+---------------+---------+-----------+----------+--------------+  POP      Full           Yes      Yes                                 +---------+---------------+---------+-----------+----------+--------------+ PTV      Full                                                        +---------+---------------+---------+-----------+----------+--------------+ PERO                                                  Not visualized +---------+---------------+---------+-----------+----------+--------------+   +---------+---------------+---------+-----------+----------+--------------+ LEFT     CompressibilityPhasicitySpontaneityPropertiesThrombus Aging +---------+---------------+---------+-----------+----------+--------------+ CFV      Full           Yes      Yes                                 +---------+---------------+---------+-----------+----------+--------------+ SFJ      Full                                                        +---------+---------------+---------+-----------+----------+--------------+ FV Prox  Full                                                        +---------+---------------+---------+-----------+----------+--------------+ FV Mid   Full                                                        +---------+---------------+---------+-----------+----------+--------------+ FV DistalFull                                                        +---------+---------------+---------+-----------+----------+--------------+ PFV      Full                                                         +---------+---------------+---------+-----------+----------+--------------+ POP      Full           Yes      Yes                                 +---------+---------------+---------+-----------+----------+--------------+  PTV      Full                                                        +---------+---------------+---------+-----------+----------+--------------+ PERO                                                  Not visualized +---------+---------------+---------+-----------+----------+--------------+     Summary: BILATERAL: - No evidence of deep vein thrombosis seen in the lower extremities, bilaterally.   *See table(s) above for measurements and observations. Electronically signed by Deitra Mayo MD on 04/10/2020 at 3:24:49 PM.    Final    CT Angio Abd/Pel w/ and/or w/o  Result Date: 05/04/2020 CLINICAL DATA:  GI bleed. Persistent bleeding. EGD and colonoscopy were negative. EXAM: CTA ABDOMEN AND PELVIS WITHOUT AND WITH CONTRAST TECHNIQUE: Multidetector CT imaging of the abdomen and pelvis was performed using the standard protocol during bolus administration of intravenous contrast. Multiplanar reconstructed images and MIPs were obtained and reviewed to evaluate the vascular anatomy. CONTRAST:  168mL OMNIPAQUE IOHEXOL 350 MG/ML SOLN COMPARISON:  CT dated May 01, 2020 FINDINGS: VASCULAR Aorta: Again noted are atherosclerotic changes of the abdominal aorta with an infrarenal abdominal aortic aneurysm measuring approximately 3-3.2 cm. This is stable in appearance from prior study. Celiac: Atherosclerotic changes are noted of the celiac axis likely resulting in mild stenosis. SMA: Heavy calcifications are noted throughout the SMA likely resulting in tandem mild-to-moderate areas of stenosis. Renals: There are heavy calcifications at the origin of both renal arteries likely resulting in some degree of stenosis. IMA: The IMA is patent. Inflow: There is ectasia of the bilateral common  iliac arteries as before. Proximal Outflow: Heavy atherosclerotic changes are noted. Veins: No obvious venous abnormality within the limitations of this arterial phase study. Review of the MIP images confirms the above findings. NON-VASCULAR Lower chest: There is atelectasis at the lung bases.The heart is enlarged. There is reflux of contrast into the IVC. Hepatobiliary: The liver is normal. There appears to be gallbladder sludge or small stones without CT evidence for cholecystitis.There is no biliary ductal dilation. Pancreas: Normal contours without ductal dilatation. No peripancreatic fluid collection. Spleen: There is a small cystic structure in the spleen, of doubtful clinical significance. Adrenals/Urinary Tract: --Adrenal glands: Unremarkable. --Right kidney/ureter: The right kidney is atrophic without evidence for hydronephrosis. There may be a nonobstructing stone in the lower pole. --Left kidney/ureter: The left kidney is atrophic without evidence for hydronephrosis. --Urinary bladder: There is a moderate amount of gas within the urinary bladder which may related to prior instrumentation. Stomach/Bowel: --Stomach/Duodenum: There is a small to moderate-sized hiatal hernia. The patient's reported small-bowel capsule is likely lodged proximal to the GE junction within the patient's hernia. --Small bowel: Unremarkable. --Colon: Liquid stool is noted within the patient's colon. This may be related to prior colonoscopy preparation. There is no evidence for active GI bleeding. A few clips are noted in the ascending colon, likely related to recent colonoscopy. --Appendix: Normal. Lymphatic: --No retroperitoneal lymphadenopathy. --No mesenteric lymphadenopathy. --No pelvic or inguinal lymphadenopathy. Reproductive: There is a 2.5 cm fluid attenuation structure that appears to be at the level of the proximal vagina/urethra. This is of  doubtful clinical significance and may represent a Bartholin gland cyst or Skene  duct cyst. Other: No ascites or free air. The abdominal wall is normal. Musculoskeletal. No acute displaced fractures. IMPRESSION: 1. No evidence for active GI bleeding. If there is slow active GI beading, a tagged red blood cell scan would be more sensitive for localization. 2. There is a small to moderate size hiatal hernia. The patient's reported small-bowel capsule is lodged within this hernia. 3. Cardiomegaly with reflux of contrast into the IVC, suggestive of right heart failure. 4. Atrophic kidneys. 5. There is a moderate amount of gas within the urinary bladder which may be related to prior instrumentation. 6. Stable vascular findings as described on the patient's CT from May 01, 2020. Aortic Atherosclerosis (ICD10-I70.0). Electronically Signed   By: Constance Holster M.D.   On: 05/04/2020 19:11   CT Angio Abd/Pel w/ and/or w/o  Result Date: 05/02/2020 CLINICAL DATA:  69 year old female currently COVID-19 positive with bleeding per rectum concerning for lower GI bleed. EXAM: CTA ABDOMEN AND PELVIS WITHOUT AND WITH CONTRAST TECHNIQUE: Multidetector CT imaging of the abdomen and pelvis was performed using the standard protocol during bolus administration of intravenous contrast. Multiplanar reconstructed images and MIPs were obtained and reviewed to evaluate the vascular anatomy. CONTRAST:  136mL OMNIPAQUE IOHEXOL 350 MG/ML SOLN COMPARISON:  Prior CT scan of the abdomen and pelvis 08/16/2019 FINDINGS: VASCULAR Aorta: Extensive heterogeneous mixed calcified and fibrofatty atherosclerotic plaque throughout the abdominal aorta. The infrarenal aorta is mildly aneurysmal at a maximum of 3 cm in diameter. Celiac: Calcified atherosclerotic plaque at the origin without significant stenosis. No aneurysm or dissection. SMA: Calcified atherosclerotic plaque results in at least moderate stenosis of the proximal SMA. Fibrofatty atherosclerotic plaque results in focal moderate stenosis in the mid SMA. Renals: There are 2  renal arteries bilaterally. All for renal arteries are heavily diseased with mixed calcified and fibrofatty atherosclerotic plaque. At least moderate stenoses present bilaterally worse on the left than the right. IMA: Heavily diseased with high-grade stenosis at the origin. The vessel opacifies distally. Inflow: Mixed calcified and fibrofatty atherosclerotic plaque extends into the inflow vessels bilaterally. Both common iliac arteries are ectatic measuring up to 1.8 cm on the left and 2.1 cm on the right. Focal moderate stenoses at the origins of the internal iliac arteries. 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 No significant stenosis or occlusion of the external iliac arteries. Proximal Outflow: Heavily diseased bilaterally. Moderate to high-grade stenosis of the right common femoral artery. Bilateral profunda femoral arteries are heavily diseased bilaterally. High-grade stenosis bordering on occlusion of the proximal right superficial femoral artery. Veins: No focal venous abnormality. Review of the MIP images confirms the above findings. NON-VASCULAR Lower chest: The visualized cardiac structures are normal in size. No pericardial effusion. Moderately large hiatal hernia. The lower lungs are clear. Hepatobiliary: Normal hepatic contour and morphology. Geographic hypoattenuation in the left hemi-liver adjacent to the fissure for the falciform ligament is nonspecific but most suggestive of benign focal fatty infiltration. No discrete solid hepatic lesion. Gallbladder is unremarkable. No intra or extrahepatic biliary ductal dilatation. Pancreas: Unremarkable. No pancreatic ductal dilatation or surrounding inflammatory changes. Spleen: 0.9 cm circumscribed low-attenuation lesion in the posteromedial aspect of the spleen is too small for accurate characterization. Statistically, this is almost certainly a benign lesion such as a cyst. Adrenals/Urinary Tract: Normal adrenal glands.  Renal atrophy bilaterally worse on the left than the right likely secondary to chronic ischemia related  to renal artery stenoses. 1.0 cm low-attenuation lesion in the right interpolar kidney is too small for accurate characterization but remains unchanged compared to prior studies and is almost certainly benign. Right lower pole renal sinus cyst, stable. Air-fluid level present within the bladder. Additionally, there is a semi lunar collection of fluid surrounding the urethra. Stomach/Bowel: No evidence of contrast extravasation into bowel on either the arterial or portal venous phase. Negative for acute bleeding at this time. No significant diverticular disease. Stomach and duodenum are unremarkable. No evidence of bowel wall thickening or obstruction. A metallic clip is present in the ascending colon. Lymphatic: No suspicious lymphadenopathy. Reproductive: Dystrophic calcified uterine fibroid. No adnexal masses. Other: No significant abdominal wall hernia. No evidence of ascites. Musculoskeletal: No acute fracture or aggressive appearing lytic or blastic osseous lesion. Lower lumbar degenerative disc disease most notable at L5-S1. IMPRESSION: VASCULAR 1. No evidence of active GI bleeding. 2. 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 Of note, the pattern of disease is not suggestive of chronic mesenteric ischemia. 3. 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 4. 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 5. 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 Recommend followup by ultrasound in 3 years. This recommendation follows ACR consensus guidelines: White Paper of the ACR Incidental Findings Committee II on Vascular Findings. J Am Coll Radiol 2013; 10:789-794. Aortic aneurysm NOS (ICD10-I71.9); Aortic Atherosclerosis (ICD10-I70.0).  NON-VASCULAR 1. No evidence of bowel wall thickening, inflammation or significant diverticulosis. 2. Metallic clip visible in the ascending colon. 3. Bilateral renal atrophy, likely secondary to chronic ischemia related renal artery stenoses. 4. Air-fluid level in the bladder is presumably related to recent instrumentation. If the patient has not been recently catheterized, then cystitis with a gas-forming organism would be a consideration. 5. Urethrocele. 6. Moderate hiatal hernia. 7. Lower lumbar degenerative disc disease. Electronically Signed   By: Jacqulynn Cadet M.D.   On: 05/02/2020 08:47

## 2020-05-06 NOTE — Progress Notes (Signed)
Patient refused all medications this am.  Stated, "I just don't feel like taking them today".  Attempts to encourage failed.

## 2020-05-06 NOTE — Progress Notes (Signed)
[facility] NEPHROLOGY PROGRESS NOTE  Assessment/ Plan: Pt is a 69 y.o. yo female with PAD status post left great toe amputation, CAD status post CABG, DM, HTN, ESRD on HD admitted with encephalopathy and gangrenous changes of right foot.  She was found to be COVID-19 positive.  Dialysis: [facility] TTS = COVID shift, will go back to MWF , 4h 49min  400/800  118kg  2/2.5 bath  Hep none  TDC.  Venofer 100 q HD until 4/17  Mircera 225 (last 4/1)  Sensipar 30 Calcitriol 2.25  # Osteomyelitis left foot/ ischemic changes right IOM:BTDH by [facility], pt refused revasc/ angio/ amputation on 4/29, was rescheduled for 5/3 but postponed given continued GIB issues, may not be able to tolerate antiplatelet agents.   # Hx PAD/ recent L 1st toe amp - w/ non-healing wound, per [facility] plan is for debridement when pt will consent for procedure.  and once GI issues resolved  #Acute blood loss anemia due to LGIB: Prior history AVM's, seen by gastroenterology withcolonoscopy done 5/4 w/ blood throughout the colon, unclear source. CTA abd was neg for source. Capsule endoscopy  showed no bleeding from esophagus or stomach.  She had blood transfusion. ESA qSat, last rec 5/8  # ESRD TTS: On [facility]S while here. No issue with access.  # Secondary hyperparathyroidism: P boerline low reduce dose of Velphoro.  Continue calcitrio.  Calcium level low and sensipar dc'd  # HTN/volume: Blood pressure acceptable.  On metoprolol.  Subjective: Receiving dialysis today.  No new event.  Patient looks comfortable.  Blood pressure acceptable. Objective Vital signs in last 24 hours: Vitals:   05/06/20 0013 05/06/20 0031 05/06/20 0218 05/06/20 0400  BP: 119/60 (!) 120/49 (!) 128/107 (!) 130/119  Pulse: 66 72 71 76  Resp: 18 20 (!) 21 (!) 21  Temp: 99 F (37.2 C) 99.4 F (37.4 C) 98.9 F (37.2 C) 99.2 F (37.3 C)  TempSrc: Oral Axillary Axillary Oral  SpO2: 100% 100% 93% 98%  Weight:    111.4 kg  Height:       Weight  change:   Intake/Output Summary (Last 24 hours) at 05/06/2020 1053 Last data filed at 05/06/2020 0300 Gross per 24 hour  Intake 30 ml  Output 0 ml  Net 30 ml       Labs: Basic Metabolic Panel: Recent Labs  Lab 05/04/20 0630 05/27/2020 0840 05/06/20 0632  NA 139 139 139  K 4.0 3.8 3.6  CL 100 102 100  CO2 25 22 23   GLUCOSE 125* 90 106*  BUN 8 18 13   CREATININE 4.10* 5.86* 3.94*  CALCIUM 7.4* 7.6* 7.7*  PHOS 2.4* 3.8 2.6   Liver Function Tests: Recent Labs  Lab 05/04/20 0630 05/27/2020 0840 05/06/20 0632  ALBUMIN 2.2* 2.0* 1.9*   No results for input(s): LIPASE, AMYLASE in the last 168 hours. No results for input(s): AMMONIA in the last 168 hours. CBC: Recent Labs  Lab 05/02/2020 0256 05/18/2020 0256 05/04/20 0630 05/04/20 0630 05/04/20 1548 05/20/2020 0840 05/06/20 0632  WBC 6.6   < > 6.2   < > 7.4 7.0 7.6  HGB 7.0*   < > 7.3*   < > 7.4* 7.3* 8.0*  HCT 22.7*   < > 24.1*   < > 25.2* 24.0* 25.4*  MCV 87.6  --  88.6  --  91.0 91.6 88.8  PLT 293   < > 269   < > 287 295 242   < > = values in this  interval not displayed.   Cardiac Enzymes: No results for input(s): CKTOTAL, CKMB, CKMBINDEX, TROPONINI in the last 168 hours. CBG: Recent Labs  Lab 05/01/2020 0745 05/21/2020 1410 05/24/2020 1809 05/06/20 0016 05/06/20 0801  GLUCAP 90 85 156* 135* 93    Iron Studies: No results for input(s): IRON, TIBC, TRANSFERRIN, FERRITIN in the last 72 hours. Studies/Results: DG Abd 1 View  Result Date: 05/04/2020 CLINICAL DATA:  Possible retained small-bowel capsule EXAM: ABDOMEN - 1 VIEW COMPARISON:  04/10/2020, CT 05/19/2020 FINDINGS: Nonobstructed gas pattern. Surgical clips in the right lower quadrant. Calcified pelvic phleboliths. Barrel-shaped metallic opacity measuring 17 mm projecting over expected location of distal esophagus/GE junction. IMPRESSION: 1. 17 mm metallic barrel-shaped opacity projects over the expected location of distal esophagus or GE junction. No additional  metallic radiopaque foreign bodies are visible over the included abdomen or pelvis. Electronically Signed   By: Donavan Foil M.D.   On: 05/04/2020 17:39   NM GI Blood Loss  Result Date: 04/29/2020 CLINICAL DATA:  Hemoglobin with large bloody stools. EXAM: NUCLEAR MEDICINE GASTROINTESTINAL BLEEDING SCAN TECHNIQUE: Sequential abdominal images were obtained following intravenous administration of Tc-95m labeled red blood cells. RADIOPHARMACEUTICALS:  26.2 mCi Tc-29m pertechnetate in-vitro labeled red cells. COMPARISON:  None. FINDINGS: It should be noted that the study is limited secondary to an extensive amount of patient motion. Normal accumulation of tracer is seen within the vascular system. No areas of abnormal tracer uptake are seen within the large and small bowel. Tracer activity is seen extending across the right abdomen which represents contaminated tracer uptake within the patient's right hand. IMPRESSION: 1. Limited study, as described above, without evidence of active gastrointestinal bleeding at the time of the exam. Electronically Signed   By: Virgina Norfolk M.D.   On: 05/05/2020 23:36   CT Angio Abd/Pel w/ and/or w/o  Result Date: 05/04/2020 CLINICAL DATA:  GI bleed. Persistent bleeding. EGD and colonoscopy were negative. EXAM: CTA ABDOMEN AND PELVIS WITHOUT AND WITH CONTRAST TECHNIQUE: Multidetector CT imaging of the abdomen and pelvis was performed using the standard protocol during bolus administration of intravenous contrast. Multiplanar reconstructed images and MIPs were obtained and reviewed to evaluate the vascular anatomy. CONTRAST:  155mL OMNIPAQUE IOHEXOL 350 MG/ML SOLN COMPARISON:  CT dated May 01, 2020 FINDINGS: VASCULAR Aorta: Again noted are atherosclerotic changes of the abdominal aorta with an infrarenal abdominal aortic aneurysm measuring approximately 3-3.2 cm. This is stable in appearance from prior study. Celiac: Atherosclerotic changes are noted of the celiac axis likely  resulting in mild stenosis. SMA: Heavy calcifications are noted throughout the SMA likely resulting in tandem mild-to-moderate areas of stenosis. Renals: There are heavy calcifications at the origin of both renal arteries likely resulting in some degree of stenosis. IMA: The IMA is patent. Inflow: There is ectasia of the bilateral common iliac arteries as before. Proximal Outflow: Heavy atherosclerotic changes are noted. Veins: No obvious venous abnormality within the limitations of this arterial phase study. Review of the MIP images confirms the above findings. NON-VASCULAR Lower chest: There is atelectasis at the lung bases.The heart is enlarged. There is reflux of contrast into the IVC. Hepatobiliary: The liver is normal. There appears to be gallbladder sludge or small stones without CT evidence for cholecystitis.There is no biliary ductal dilation. Pancreas: Normal contours without ductal dilatation. No peripancreatic fluid collection. Spleen: There is a small cystic structure in the spleen, of doubtful clinical significance. Adrenals/Urinary Tract: --Adrenal glands: Unremarkable. --Right kidney/ureter: The right kidney is atrophic without evidence for  hydronephrosis. There may be a nonobstructing stone in the lower pole. --Left kidney/ureter: The left kidney is atrophic without evidence for hydronephrosis. --Urinary bladder: There is a moderate amount of gas within the urinary bladder which may related to prior instrumentation. Stomach/Bowel: --Stomach/Duodenum: There is a small to moderate-sized hiatal hernia. The patient's reported small-bowel capsule is likely lodged proximal to the GE junction within the patient's hernia. --Small bowel: Unremarkable. --Colon: Liquid stool is noted within the patient's colon. This may be related to prior colonoscopy preparation. There is no evidence for active GI bleeding. A few clips are noted in the ascending colon, likely related to recent colonoscopy. --Appendix: Normal.  Lymphatic: --No retroperitoneal lymphadenopathy. --No mesenteric lymphadenopathy. --No pelvic or inguinal lymphadenopathy. Reproductive: There is a 2.5 cm fluid attenuation structure that appears to be at the level of the proximal vagina/urethra. This is of doubtful clinical significance and may represent a Bartholin gland cyst or Skene duct cyst. Other: No ascites or free air. The abdominal wall is normal. Musculoskeletal. No acute displaced fractures. IMPRESSION: 1. No evidence for active GI bleeding. If there is slow active GI beading, a tagged red blood cell scan would be more sensitive for localization. 2. There is a small to moderate size hiatal hernia. The patient's reported small-bowel capsule is lodged within this hernia. 3. Cardiomegaly with reflux of contrast into the IVC, suggestive of right heart failure. 4. Atrophic kidneys. 5. There is a moderate amount of gas within the urinary bladder which may be related to prior instrumentation. 6. Stable vascular findings as described on the patient's CT from May 01, 2020. Aortic Atherosclerosis (ICD10-I70.0). Electronically Signed   By: Constance Holster M.D.   On: 05/04/2020 19:11    Medications: Infusions: . sodium chloride      Scheduled Medications: . sodium chloride   Intravenous Once  . allopurinol  100 mg Oral Daily  . atorvastatin  40 mg Oral q1800  . buPROPion  150 mg Oral BID  . calcitRIOL  2.25 mcg Oral Q T,Th,Sat-1800  . Chlorhexidine Gluconate Cloth  6 each Topical Q0600  . darbepoetin (ARANESP) injection - DIALYSIS  150 mcg Intravenous Q Sat-HD  . feeding supplement  1 Container Oral TID BM  . gabapentin  300 mg Oral QHS  . linaclotide  72 mcg Oral QAC breakfast  . loratadine  10 mg Oral Daily  . metoprolol tartrate  25 mg Oral BID  . pantoprazole  40 mg Oral Daily  . polyethylene glycol  17 g Oral BID  . sucroferric oxyhydroxide  1,000 mg Oral TID WC    have reviewed scheduled and prn medications.  Physical  Exam: [place], NAD RRR CTAB ant Nl s1s2 B/l feet bandaged  Daejon Lich B Lovinia Snare 05/06/2020,10:53 AM  LOS: 11 days

## 2020-05-07 ENCOUNTER — Encounter (HOSPITAL_COMMUNITY): Admission: EM | Disposition: E | Payer: Self-pay | Source: Skilled Nursing Facility | Attending: Internal Medicine

## 2020-05-07 DIAGNOSIS — Z515 Encounter for palliative care: Secondary | ICD-10-CM

## 2020-05-07 DIAGNOSIS — Z7189 Other specified counseling: Secondary | ICD-10-CM

## 2020-05-07 DIAGNOSIS — E875 Hyperkalemia: Secondary | ICD-10-CM

## 2020-05-07 LAB — TYPE AND SCREEN
ABO/RH(D): O POS
Antibody Screen: NEGATIVE
Unit division: 0
Unit division: 0

## 2020-05-07 LAB — CBC WITH DIFFERENTIAL/PLATELET
Abs Immature Granulocytes: 0.1 10*3/uL — ABNORMAL HIGH (ref 0.00–0.07)
Basophils Absolute: 0 10*3/uL (ref 0.0–0.1)
Basophils Relative: 0 %
Eosinophils Absolute: 0 10*3/uL (ref 0.0–0.5)
Eosinophils Relative: 0 %
HCT: 24.5 % — ABNORMAL LOW (ref 36.0–46.0)
Hemoglobin: 7.5 g/dL — ABNORMAL LOW (ref 12.0–15.0)
Immature Granulocytes: 1 %
Lymphocytes Relative: 13 %
Lymphs Abs: 1 10*3/uL (ref 0.7–4.0)
MCH: 28.1 pg (ref 26.0–34.0)
MCHC: 30.6 g/dL (ref 30.0–36.0)
MCV: 91.8 fL (ref 80.0–100.0)
Monocytes Absolute: 0.6 10*3/uL (ref 0.1–1.0)
Monocytes Relative: 8 %
Neutro Abs: 5.6 10*3/uL (ref 1.7–7.7)
Neutrophils Relative %: 78 %
Platelets: 266 10*3/uL (ref 150–400)
RBC: 2.67 MIL/uL — ABNORMAL LOW (ref 3.87–5.11)
RDW: 18.9 % — ABNORMAL HIGH (ref 11.5–15.5)
WBC: 7.3 10*3/uL (ref 4.0–10.5)
nRBC: 2.7 % — ABNORMAL HIGH (ref 0.0–0.2)

## 2020-05-07 LAB — RENAL FUNCTION PANEL
Albumin: 1.7 g/dL — ABNORMAL LOW (ref 3.5–5.0)
Anion gap: 16 — ABNORMAL HIGH (ref 5–15)
BUN: 25 mg/dL — ABNORMAL HIGH (ref 8–23)
CO2: 23 mmol/L (ref 22–32)
Calcium: 7.5 mg/dL — ABNORMAL LOW (ref 8.9–10.3)
Chloride: 102 mmol/L (ref 98–111)
Creatinine, Ser: 5.68 mg/dL — ABNORMAL HIGH (ref 0.44–1.00)
GFR calc Af Amer: 8 mL/min — ABNORMAL LOW (ref >60)
GFR calc non Af Amer: 7 mL/min — ABNORMAL LOW (ref >60)
Glucose, Bld: 93 mg/dL (ref 70–99)
Phosphorus: 3.9 mg/dL (ref 2.5–4.6)
Potassium: 3.7 mmol/L (ref 3.5–5.1)
Sodium: 141 mmol/L (ref 135–145)

## 2020-05-07 LAB — BPAM RBC
Blood Product Expiration Date: 202105272359
Blood Product Expiration Date: 202106092359
ISSUE DATE / TIME: 202105061534
ISSUE DATE / TIME: 202105090008
Unit Type and Rh: 5100
Unit Type and Rh: 5100

## 2020-05-07 LAB — GLUCOSE, CAPILLARY
Glucose-Capillary: 79 mg/dL (ref 70–99)
Glucose-Capillary: 89 mg/dL (ref 70–99)
Glucose-Capillary: 122 mg/dL — ABNORMAL HIGH (ref 70–99)
Glucose-Capillary: 164 mg/dL — ABNORMAL HIGH (ref 70–99)

## 2020-05-07 LAB — PATHOLOGIST SMEAR REVIEW

## 2020-05-07 SURGERY — CANCELLED PROCEDURE

## 2020-05-07 SURGICAL SUPPLY — 15 items

## 2020-05-07 NOTE — Consult Note (Signed)
Consultation Note Date: 05/26/2020   Patient Name: [name] [name]  DOB: [birth year]  MRN: [id number]  Age / Sex: 69 y.o., female  PCP: [facility] Referring Physician: Thurnell Lose, MD  Reason for Consultation: Establishing goals of care  HPI/Patient Profile: 69 y.o. female  with past medical history of anemia, CHF, CKD, CVA, and hypertension admitted on 04/19/2020 with altered mental status from [facility].   Patient had a right great toe amputation on 02/07/2020, which was found to be open with drainage and gangrenous on arrive to ED. Patient had also missed one session of dialysis prior to ED arrival.  In ED she tested positive for COVID19.   Further inpatient workup has shown osteomyelitis of of left foot and ischemic changes to the right leg. Revascularization/angio/amputation was scheduled for 5/3 but postponed due to continued GIB.   She has a history of recurrent upper GI bleeding due to AVMs that has required numerous pRBC transfusions.  During this admission patient has refused several EGD/colonoscopies and failed a capsule study (was retained and lodged in hiatal hernia). A colonoscopy was performed 04/29/2020, which showed blood throughout the colon. She refused PT today.  She has had 6 inpatient admissions and 1 ED admission in the last 6 months.  Patient and family face treatment option decisions, advanced directive decisions, and anticipatory care needs.  Patient currently does not have medical decisional capacity.   Clinical Assessment and Goals of Care:  This NP [name] [name] reviewed medical records, received report from team, assessed the patient at bedside in an attempt  to discuss diagnosis, prognosis, GOC, EOL wishes disposition and options.  Patient was alert x2 but has poor insight into her complicated medical condition.  Today she ws not aware that she has  ESRD and is dialyses dependent.     She easily agitates with questioning her regarding her medical situation.  Spoke to patient's sister [name] [name] regarding her sister and the above concepts.  She and her 2 siblings are aware of the seriousness of her sister's medical situation and the difficult and complicated decisions they are facing.    A detailed discussion was had today regarding advanced directives.  Concepts specific to code status, artifical feeding and hydration, continued IV antibiotics and rehospitalization was had.  The difference between a aggressive medical intervention path  and a palliative comfort care path for this patient at this time was had.  Values and goals of care important to patient and family were attempted to be elicited.  Natural trajectory and expectations at EOL were discussed.  Questions and concerns addressed.   Family encouraged to call with questions or concerns.     Education offered on the legal process for making medical decisions when the patient does not have capacity.    Patient has a significant other of 23 years however he himself has complex medical issues and is currently in a skilled nursing facility.   Plan is made to have a conference call on Wednesday May  12 at 11:15 with the patient's 3 siblings to discuss the patient's current medical situation, goals of care, end-of-life wishes, treatment option decisions and anticipatory care needs.  There is no documented healthcare power of attorney or advanced directive   PMT will continue to support holistically.   NEXT OF KIN [name] [name] and [name] [name], sisters [name] [name], brother  SUMMARY OF RECOMMENDATIONS    Code Status/Advance Care Planning:  Full code    Palliative Prophylaxis:   Aspiration, Bowel Regimen, Delirium Protocol and Oral Care  Additional Recommendations (Limitations, Scope, Preferences):  Full Scope Treatment  Psycho-social/Spiritual:    Additional Recommendations: Caregiving   Support/Resources   Created space and opportunity for sister/[name] [name] to explore her thoughts and feelings regarding her sister's current medical situation.  She verbalizes "how difficult this is", she is grateful to have the opportunity to have a conference call with her siblings "she does not want all this decision only on her"    She speaks to her sisters "personality" and that she has always been very private and controlling, but she is the oldest child".   Prognosis:   Unable to determine  Discharge Planning: To Be Determined      Primary Diagnoses: Present on Admission: . Essential hypertension . Chronic combined systolic and diastolic CHF (congestive heart failure) ([place]) . Obesity, Class III, BMI 40-49.9 (morbid obesity) ([place]) . Diabetic gastroparesis ([place]) . Unspecified atrial fibrillation ([place]) . Gangrene of right foot ([place])   I have reviewed the medical record, interviewed the patient and family, and examined the patient. The following aspects are pertinent.  Past Medical History:  Diagnosis Date  . Abscess   . Acute blood loss anemia 08/17/2019  . Acute pulmonary edema (HCC)   . Acute respiratory failure ([place]) 10/18/2014  . Acute respiratory failure with hypoxia and hypercapnia ([place]) 06/01/2019  . AMS (altered mental status) 01/01/2020  . Anemia 08/2016  . Angiodysplasia of colon   . Arthritis of left shoulder region 03/23/2013  . Bleeding gastrointestinal   . Cardiomegaly 05/2019  . Chest pain 04/17/2016  . Chronic combined systolic and diastolic CHF (congestive heart failure) (HCC)    a. EF 40-45%, mild LVH, mid apicalanteroseptal and apical HK.  . CKD (chronic kidney disease), stage III   . Cocaine abuse ([place])    crack cocaine heavily until 2008 then sporadic use since then  . Coronary artery disease    a. 06/2012 NSTEMI/CABG x 3 (LIMA->LAD, VG->OM2, VG->LCX);  b. 04/2015 MV: EF<30%, mid ant, apicalanterior, apical infarct;  c. 04/2015 Cath: LM nl, LAD 90p, LCX 12m,  OM1 min irregs, RCA mild dzs, LIMA->LAD nl w/ dist LAD dzs, VG->OM2 nl, VG->LCX nl-->Med Rx.  . CVA (cerebral infarction)    a. right internal capsule stroke in 12/2006  . Demand ischemia ([place])   . Diabetes mellitus    diagnosed in 2008  . Elevated troponin 04/27/2019  . Essential hypertension   . Glaucoma   . Gout   . Heme positive stool   . HFrEF (heart failure with reduced ejection fraction) ([place])   . Hyperlipidemia   . Hyperparathyroidism, secondary renal ([place])   . Hypertensive crisis 06/02/2019  . Left-sided sensory deficit present   . Lobar pneumonia ([place]) 04/27/2019  . Obesity, morbid ([place])   . Pneumonia   . Pulmonary edema 05/2019  . PVD (peripheral vascular disease) ([place])    a. 06/2012 ABI's: R - 0.73, L - 0.71.  Marland Kitchen Renal mass, right   . Sepsis ([place]) 04/27/2019  . Shortness  of breath dyspnea   . Stroke ([place])   . Thrombocytosis ([place]) 04/17/2016  . Thyroid nodule    FNA in 4193 showed follicular cells but not definate neoplasm  . Tobacco abuse   . Trichomoniasis    Social History   Socioeconomic History  . Marital status: Single    Spouse name: Not on file  . Number of children: 0  . Years of education: Not on file  . Highest education level: Not on file  Occupational History  . Not on file  Tobacco Use  . Smoking status: Current Some Day Smoker    Packs/day: 0.25    Years: 50.00    Pack years: 12.50    Types: Cigarettes  . Smokeless tobacco: Never Used  Substance and Sexual Activity  . Alcohol use: No    Alcohol/week: 0.0 standard drinks  . Drug use: Yes    Types: Cocaine    Comment: pt denies at current time  . Sexual activity: Not Currently  Other Topics Concern  . Not on file  Social History Narrative   Lives with boyfriend of 20 years.    Social Determinants of Health   Financial Resource Strain:   . Difficulty of Paying Living Expenses:   Food Insecurity:   . Worried About Charity fundraiser in the Last Year:   . Arboriculturist in the Last  Year:   Transportation Needs:   . Film/video editor (Medical):   Marland Kitchen Lack of Transportation (Non-Medical):   Physical Activity:   . Days of Exercise per Week:   . Minutes of Exercise per Session:   Stress:   . Feeling of Stress :   Social Connections:   . Frequency of Communication with Friends and Family:   . Frequency of Social Gatherings with Friends and Family:   . Attends Religious Services:   . Active Member of Clubs or Organizations:   . Attends Archivist Meetings:   Marland Kitchen Marital Status:    Family History  Problem Relation Age of Onset  . Diabetes Mother   . Hypertension Mother   . Cancer Mother   . Hyperlipidemia Father   . Hypertension Father   . Kidney disease Father   . Gout Father   . Cerebrovascular Accident Father   . Other Other        no known family CAD   Scheduled Meds: . sodium chloride   Intravenous Once  . allopurinol  100 mg Oral Daily  . atorvastatin  40 mg Oral q1800  . buPROPion  150 mg Oral BID  . calcitRIOL  2.25 mcg Oral Q T,Th,Sat-1800  . Chlorhexidine Gluconate Cloth  6 each Topical Q0600  . darbepoetin (ARANESP) injection - DIALYSIS  150 mcg Intravenous Q Sat-HD  . feeding supplement  1 Container Oral TID BM  . gabapentin  300 mg Oral QHS  . linaclotide  72 mcg Oral QAC breakfast  . loratadine  10 mg Oral Daily  . metoprolol tartrate  25 mg Oral BID  . pantoprazole  40 mg Oral Daily  . polyethylene glycol  17 g Oral BID  . sucroferric oxyhydroxide  500 mg Oral TID WC   Continuous Infusions: . sodium chloride     PRN Meds:.sodium chloride, acetaminophen, albuterol, alteplase, bisacodyl, Gerhardt's butt cream, heparin, lidocaine (PF), lidocaine-prilocaine, metoprolol tartrate, mometasone-formoterol, nitroGLYCERIN, oxyCODONE, pentafluoroprop-tetrafluoroeth, sodium chloride flush Medications Prior to Admission:  Prior to Admission medications   Medication Sig Start Date End Date Taking?  Authorizing Provider  albuterol  (VENTOLIN HFA) 108 (90 Base) MCG/ACT inhaler Inhale 2 puffs into the lungs every 6 (six) hours as needed for wheezing or shortness of breath.   Yes [provider]  allopurinol (ZYLOPRIM) 100 MG tablet Take 1 tablet (100 mg total) by mouth daily. 02/28/20  Yes Gladys Damme, MD  aspirin 81 MG EC tablet Take 1 tablet (81 mg total) by mouth daily. 02/28/20  Yes Gladys Damme, MD  atorvastatin (LIPITOR) 40 MG tablet Take 1 tablet (40 mg total) by mouth daily at 6 PM. Patient taking differently: Take 40 mg by mouth daily.  02/28/20  Yes Gladys Damme, MD  buPROPion [facility] SR) 150 MG 12 hr tablet Take 1 tablet (150 mg total) by mouth 2 (two) times daily. 01/16/16  Yes Mikhail, Velta Addison, DO  calcitRIOL (ROCALTROL) 0.25 MCG capsule Take 9 capsules (2.25 mcg total) by mouth every Tuesday, Thursday, and Saturday at 6 PM. 04/19/20  Yes Ghimire, Dante Gang, MD  cetirizine (ZYRTEC) 10 MG tablet Take 10 mg by mouth daily as needed for allergies.    Yes [provider]  cinacalcet (SENSIPAR) 30 MG tablet Take 1 tablet (30 mg total) by mouth every Tuesday, Thursday, and Saturday at 6 PM. 04/19/20  Yes Ghimire, Dante Gang, MD  ferrous sulfate 325 (65 FE) MG tablet Take 325 mg by mouth 2 (two) times daily with a meal.    Yes [provider]  gabapentin (NEURONTIN) 300 MG capsule Take 1 capsule (300 mg total) by mouth at bedtime. 02/10/20  Yes Aline August, MD  insulin lispro (HUMALOG) 100 UNIT/ML injection Inject 2 Units into the skin 3 (three) times daily before meals.   Yes [provider]  latanoprost (XALATAN) 0.005 % ophthalmic solution Place 1 drop into both eyes at bedtime.   Yes [provider]  LINZESS 72 MCG capsule Take 72 mcg by mouth daily before breakfast.  11/21/17  Yes [provider]  metoprolol tartrate (LOPRESSOR) 25 MG tablet Take 1 tablet (25 mg total) by mouth 2 (two) times daily. 02/10/20  Yes Aline August, MD  mometasone-formoterol (DULERA)  100-5 MCG/ACT AERO Inhale 2 puffs into the lungs every 4 (four) hours as needed for wheezing.   Yes [provider]  nitroGLYCERIN (NITROSTAT) 0.4 MG SL tablet Place 1 tablet (0.4 mg total) under the tongue every 5 (five) minutes as needed for chest pain. 04/30/19  Yes [name], MD  pantoprazole (PROTONIX) 40 MG tablet Take 1 tablet (40 mg total) by mouth daily. 02/28/20 04/02/2020 Yes Gladys Damme, MD  polyethylene glycol (MIRALAX) 17 g packet Take 17 g by mouth daily as needed for moderate constipation. 02/10/20  Yes Aline August, MD  TUMS 500 MG chewable tablet Chew 2 tablets by mouth at bedtime. 01/19/20  Yes [provider]  VELPHORO 500 MG chewable tablet Chew 2 tablets (1,000 mg total) by mouth 3 (three) times daily with meals. 02/10/20  Yes Aline August, MD  lidocaine-prilocaine (EMLA) cream Apply 1 application topically See admin instructions. Apply a small amount to access site (AVF) three times a week, 1 hour before dialysis. Cover with Occlusive dressing (Saran Wrap) 04/02/20   [provider]   Allergies  Allergen Reactions  . Naproxen Rash   Review of Systems  Physical Exam  Vital Signs: BP (!) 157/69 (BP Location: Right Arm)   Pulse 68   Temp 98.7 F (37.1 C) (Oral)   Resp 16   Ht 5\' 7"  (1.702 m)   Wt 112.5  kg   SpO2 90%   BMI 38.85 kg/m  Pain Scale: 0-10 POSS *See Group Information*: 1-Acceptable,Awake and alert Pain Score: Asleep   SpO2: SpO2: 90 % O2 Device:SpO2: 90 % O2 Flow Rate: .O2 Flow Rate (L/min): 2 L/min  IO: Intake/output summary:   Intake/Output Summary (Last 24 hours) at 05/21/2020 1542 Last data filed at 05/08/2020 0018 Gross per 24 hour  Intake --  Output 675 ml  Net -675 ml    LBM: Last BM Date: 05/06/20 Baseline Weight: Weight: 122 kg Most recent weight: Weight: 112.5 kg     Palliative Assessment/Data: 30 %    Discussed with RN and Dr. Candiss Norse via secure chat  Time In: 1000 Time Out: 1110 Time Total: 73 m  inutes Greater than 50%  of this time was spent counseling and coordinating care related to the above assessment and plan.  Signed by: Lin Landsman, NP   Please contact Palliative Medicine Team phone at [phone] for questions and concerns.  For individual provider: See Shea Evans

## 2020-05-07 NOTE — Progress Notes (Addendum)
Pt pulled out rectal tube said "she does not want it and we can not replace it" showed pt the bag that has 346ml of bloody stool and explained why she needed to have it in. Pt still refused oncall notified

## 2020-05-07 NOTE — Progress Notes (Signed)
Pt has had no further bloody stools since she pulled out her rectal tube

## 2020-05-07 NOTE — Progress Notes (Signed)
PT Cancellation Note  Patient Details Name: [name] [name] MRN: [id number] DOB: [birth year]   Cancelled Treatment:    Reason Eval/Treat Not Completed: Patient declined, no reason specified.  Pt refusing any mobility at this time (even EOB) stating, "i'm not ready".  I tried to drill down as to why she was not ready, but pt unable to specify.  PT will check back tomorrow.   Thanks,  Verdene Lennert, PT, DPT  Acute Rehabilitation [phone] pager #[phone] office       [name] Guiles [name] 05/19/2020, 12:45 PM

## 2020-05-07 NOTE — Progress Notes (Signed)
[facility] Progress Note   Assessment/ Plan:   Dialysis:[facility] TTS =COVID shift, will go back to MWF ,4h 25min 400/800 118kg 2/2.5 bath Hep none TDC. Venofer 100 q HD until 4/17 Mircera 225 (last 4/1) Sensipar 30 Calcitriol 2.25  # Osteomyelitis left foot/ ischemic changes right JXB:JYNW by [facility], pt refused revasc/ angio/ amputation on 4/29, was rescheduled for 5/3 but postponed given continued GIB issues, may not be able to tolerate antiplatelet agents.   # Hx PAD/ recent L 1st toe amp - w/ non-healing wound, per [facility] plan is for debridement when pt will consent for procedure. and once GI issues resolved  #Acute blood loss anemia due to LGIB: Prior history AVM's, seen by gastroenterology withcolonoscopy done 5/4 w/ blood throughout the colon, unclear source. Tagged scan 5/8 negative. Pt has refused prep, palliative care now involved.    # ESRD TTS: On THS while here. No issue with access.  # Secondary hyperparathyroidism: P boerline low reduce dose of Velphoro.  Continue calcitriol.  Calcium level low and sensipar dc'd  # HTN/volume: Blood pressure acceptable.  On metoprolol.  Subjective:    Refused prep for EGD again.  Cancelled 05/20/2020.  Pall care consulted.     Objective:   BP (!) 168/87 (BP Location: Right Arm)   Pulse 80   Temp 98.5 F (36.9 C) (Oral)   Resp 15   Ht 5\' 7"  (1.702 m)   Wt 112.5 kg   SpO2 99%   BMI 38.85 kg/m   Physical Exam: Gen: NAD, sitting in bed CVS: RRR no m/r/g Resp: clear bilaterally no c/w/r Abd: soft Ext: R foot/ leg dressed  Labs: BMET Recent Labs  Lab 05/02/2020 0501 05/02/20 0749 05/08/2020 0256 05/04/20 0630 05/18/2020 0840 05/06/20 0632 05/22/2020 0320  NA 144 138 137 139 139 139 141  K 3.6 4.3 3.8 4.0 3.8 3.6 3.7  CL 101 99 101 100 102 100 102  CO2 23 25 25 25 22 23 23   GLUCOSE 99 74 93 125* 90 106* 93  BUN 35* 15 19 8 18 13  25*  CREATININE 8.65* 5.08* 6.48* 4.10* 5.86* 3.94* 5.68*  CALCIUM 7.3*  7.7* 7.2* 7.4* 7.6* 7.7* 7.5*  PHOS 5.4* 3.1 3.4 2.4* 3.8 2.6 3.9   CBC Recent Labs  Lab 05/04/20 1548 05/20/2020 0840 05/06/20 0632 05/27/2020 0320  WBC 7.4 7.0 7.6 7.3  NEUTROABS  --   --   --  5.6  HGB 7.4* 7.3* 8.0* 7.5*  HCT 25.2* 24.0* 25.4* 24.5*  MCV 91.0 91.6 88.8 91.8  PLT 287 295 242 266      Medications:    . sodium chloride   Intravenous Once  . allopurinol  100 mg Oral Daily  . atorvastatin  40 mg Oral q1800  . buPROPion  150 mg Oral BID  . calcitRIOL  2.25 mcg Oral Q T,Th,Sat-1800  . Chlorhexidine Gluconate Cloth  6 each Topical Q0600  . darbepoetin (ARANESP) injection - DIALYSIS  150 mcg Intravenous Q Sat-HD  . feeding supplement  1 Container Oral TID BM  . gabapentin  300 mg Oral QHS  . linaclotide  72 mcg Oral QAC breakfast  . loratadine  10 mg Oral Daily  . metoprolol tartrate  25 mg Oral BID  . pantoprazole  40 mg Oral Daily  . polyethylene glycol  17 g Oral BID  . sucroferric oxyhydroxide  500 mg Oral TID WC     Madelon Lips, MD [facility] pgr [phone] 05/09/2020, 12:51  PM  

## 2020-05-07 NOTE — Progress Notes (Signed)
Spoke with Dr. Candiss Norse of [facility] this AM.  Patient again refused bowel prep for capsule study.  KUB yesterday shows capsule has passed to distal SB, so is expected to pass and EGD for retrieval no longer necessary.  Note from today indicates palliative care to discuss goals of care.  There is no more we can do at this point given patient's repeated refusal to prep for capsule study.  Difficult to know if capsule study would even find something upon which we could intervene and then make DAPT possible for vascular intervention.  Long term prognosis poor.  Signing off - call as needed.

## 2020-05-07 NOTE — Progress Notes (Signed)
PROGRESS NOTE                                                                                                                                                                                                             Patient Demographics:    [name] [name], is a 69 y.o. female, DOB - 1951-11-12, ASN:[id number]  Outpatient Primary MD for the patient is [place] date - 04/07/2020   LOS - 12  Chief Complaint  Patient presents with  . Altered Mental Status       Brief Narrative: Patient is a 69 y.o. 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  She was also incidentally found to have COVID-19.  Hospital course complicated by recurrent GI bleeding of obscure etiology-see below.  Significant Events: 4/6-4/22>> admit to [facility] for upper GI bleeding and blood loss anemia, left great toe osteo s/p amputation 4/28>> admit to [facility] for acute metabolic encephalopathy 1/93>> refused to sign consent for angiogram/amputation-rescheduled for 5/3 4/30>> bloody stools  5/2>> colonoscopy postponed-incomplete prep-due to refusal to complete bowel prep 5/3>> colonoscopy postponed-incomplete prep-due to refusal to complete bowel prep, lower extremity angiogram postponed-due to GI bleeding. 5/4>> colonoscopy- residual blood in the entire colon 5/6>> capsule endoscopy-unfortunately capsule retained in the stomach 5/8>> EGD with capsule placement in small bowel postponed as patient refused to drink prep on 5/7 pm 5/8>> tagged RBC scan negative  Procedures/studies: 5/7>> repeat CT mesenteric angiogram: No active bleeding source 5/6>>capsule  endoscopy>> capsule retained in the stomach 5/4>> colonoscopy-residual blood throughout: 5/4>> CT mesenteric angiogram: No active bleeding source 4/14>> left great toe amputation 4/10>> EGD/small bowel enteroscopy>> AVM third portion of duodenum  COVID-19 medications: Remdesivir: 4/28>> 4/29  Antibiotics: Vancomycin: 4/28>>5/1 Zosyn: 4/28>>5/7  Microbiology data: 4/28: Blood culture>> no growth 4/28: Wound swab/superficial culture>> Pseudomonas  DVT prophylaxis: Hold Heparin 4/30 2/2 GI bleed - SCDs  Consults: VVS, renal,GI, Pall Care    Subjective:   Patient in bed, appears comfortable, denies any headache, no fever, no chest pain or pressure, no shortness of breath , no  abdominal pain. No focal weakness. Refused Bowel prep again 05/06/20.   Assessment  & Plan :   Acute metabolic encephalopathy: Likely secondary to SIRS/right foot gangrene-uremia due to missed HD.  Significantly improved after treatment of SIRS/foot infection with gangrene and dialysis-now back to baseline.  Sepsis with right lower extremity critical limb ischemia: Afebrile- sepsis physiology has resolved-has gangrenous changes evident on exam-please see pictures taken on 4/28 and prior notes.  Blood cultures negative so far.    Superficial swab on admission positive for Pseudomonas-not sure how accurate this is.  Has completed a 10-day course of Zosyn with plans to watch off antibiotics.   Refused angiogram/amputation on 4/29-was rescheduled for 5/3-however with her ongoing GI bleeding-angiogram postponed for now. Very difficult situation-this patient has known history of AVMs and history of recurrent  GI bleeding requiring PRBC transfusion-and may not be able to tolerate long-term dual antiplatelet therapy.    Once GI work-up has been completed-we will need to notify vascular surgery (Dr. Servando Snare) - for angiogram/amputation needs..  History of PAD with recent left great toe amputation-now with nonhealing  left foot wound: Will need debridement with vascular surgery once her GI bleeding has resolved.  No longer on aspirin due to GI bleeding-remains on statin.  Recurrent upper GI bleeding-history of AVMs and acute blood loss anemia: Continues to have stuttering hematochezia/melena-requirement 3 units of PRBC so far last transfusion on 05/06/2020, Colonoscopy revealed changes suggestive of possible small bowel , negative tagged RBC scan on 05/22/2020.  First capsule endoscopy failed as the capsule got stuck in the stomach, plan to do EGD to retrieve old capsule and to place a new capsule in the duodenum however patient has been noncompliant with bowel prep and swallowing and repeat EGD has been rescheduled 3 times, finally we tried for the fourth time again on 05/06/2020 she had initially agreed to do the bowel prep but again at night refused it, EGD again canceled on 05/27/2020 this is the 3rd or 4th time that we have canceled her endoscopy procedure this admission.  Continue to monitor packed RBC.  GI on board.  Continue PPI for now.  Hold antiplatelets.  I explained and warned the patient that noncompliance with medical procedure can result in ongoing bleed with eventual stroke disability or death.  She understands the consequences and states keep me comfortable, her aunt has already been informed and I personally talked to patient's sister in detail on 05/06/2020 who states that she has tried her best talking to her sister but now gives up.  Will involve palliative care for future goals of care with this ongoing situation.  Hyperkalemia: Secondary to missed HD-resolved with HD.  Follow periodically.  ESRD: Nephrology following and directing care. TTS - HD.  Anemia: Multifactorial-likely secondary to kidney disease-worsened by blood loss-we will transfuse another unit of PRBC today.  HTN: BP relatively well controlled-continue metoprolol  Paroxysmal atrial fibrillation/atrial flutter: Not a new diagnosis-Per  discharge summary in October 2020-she also had atrial fibrillation  as well.  Given recurrent GI bleeding/anemia-not a good candidate for anticoagulation.  Continue rate control with metoprolol.  Chronic combined diastolic and systolic heart failure (EF 45-50% by TTE on 04/28/2019): Volume status stable-diuresis with HD.  History of CVA: Continue statin-see above regarding aspirin and anticoagulation  Gout: Not in flare-continue allopurinol  Covid 19 Viral infection: Incidental, no symptoms or pneumonia on x-ray, monitor.  O2 requirements:  SpO2: 99 % O2 Flow Rate (L/min): 2 L/min   COVID-19 Labs: No  results for input(s): DDIMER, FERRITIN, LDH, CRP in the last 72 hours.     Component Value Date/Time   BNP 897.8 (H) 01/01/2020 0600    No results for input(s): PROCALCITON in the last 168 hours.  Lab Results  Component Value Date   SARSCOV2NAA POSITIVE (A) 04/08/2020   [place] NEGATIVE 04/16/2020   [place] NEGATIVE 04/03/2020   [place] NEGATIVE 03/15/2020     DM-2 with hypoglycemia (A1C 5.1 on 04/03/2020): No further hypoglycemic episodes-continue to hold all insulin for now.  Not a good candidate for tight glycemic control-allow some amount of permissive hyperglycemia.  CBG (last 3)  Recent Labs    05/06/20 1638 05/06/20 2144 05/24/2020 0754  GLUCAP 89 147* 79    Morbid Obesity: Estimated body mass index is 38.85 kg/m as calculated from the following:   Height as of this encounter: 5\' 7"  (1.702 m).   Weight as of this encounter: 112.5 kg.  Pressure Injury 05/04/2020 Ankle Posterior;Right Unstageable - Full thickness tissue loss in which the base of the injury is covered by slough (yellow, tan, gray, green or brown) and/or eschar (tan, brown or black) in the wound bed. black/brown wound bed o (Active)  05/03/2020 1630  Location: Ankle  Location Orientation: Posterior;Right  Staging: Unstageable - Full thickness tissue loss in which the base of the injury is covered  by slough (yellow, tan, gray, green or brown) and/or eschar (tan, brown or black) in the wound bed.  Wound Description (Comments): black/brown wound bed on heel  Present on Admission:     Condition - Guarded  Family Communication  : Previous MD discussed with patient's aunt over the phone 5/8-have asked her to talk to the patient and encourage patient to complete her bowel prep this evening.   Sister [name] [phone] in detail on 04/30/2020.  Code Status :  Full Code  Diet :  Diet Order            Diet clear liquid Room service appropriate? Yes; Fluid consistency: Thin  Diet effective now               Disposition Plan  :  Status is: Inpatient  Remains inpatient appropriate because:Inpatient level of care appropriate due to severity of illness  Dispo: The patient is from: SNF              Anticipated d/c is to: SNF              Anticipated d/c date is: > 3 days              Patient currently is not medically stable to d/c.  Barriers to discharge: Ongoing GI bleeding without obvious source-right foot gangrene on IV antibiotics-probably will require revascularization/amputation once GI bleeding is controlled.  Antimicorbials  :    Anti-infectives (From admission, onward)   Start     Dose/Rate Route Frequency Ordered Stop   04/28/20 1200  vancomycin (VANCOCIN) IVPB 1000 mg/200 mL premix  Status:  Discontinued     1,000 mg 200 mL/hr over 60 Minutes Intravenous Every T-Th-Sa (Hemodialysis) 04/27/20 1154 04/28/20 1218   04/26/20 1400  piperacillin-tazobactam (ZOSYN) IVPB 2.25 g  Status:  Discontinued     2.25 g 100 mL/hr over 30 Minutes Intravenous Every 8 hours 04/26/20 1314 05/04/20 1524   04/26/20 1000  remdesivir 100 mg in sodium chloride 0.9 % 100 mL IVPB  Status:  Discontinued     100 mg 200 mL/hr over 30 Minutes Intravenous Daily 04/24/2020 1952  04/26/20 1105   03/29/2020 2030  remdesivir 200 mg in sodium chloride 0.9% 250 mL IVPB     200 mg 580 mL/hr over 30 Minutes  Intravenous Once 04/14/2020 1952 04/11/2020 2327   04/19/2020 1515  vancomycin (VANCOCIN) 2,500 mg in sodium chloride 0.9 % 500 mL IVPB     2,500 mg 250 mL/hr over 120 Minutes Intravenous  Once 04/03/2020 1513 04/16/2020 2026   04/08/2020 1513  vancomycin variable dose per unstable renal function (pharmacist dosing)  Status:  Discontinued      Does not apply See admin instructions 04/14/2020 1514 04/28/20 1218   04/09/2020 1500  piperacillin-tazobactam (ZOSYN) IVPB 3.375 g     3.375 g 100 mL/hr over 30 Minutes Intravenous  Once 04/20/2020 1450 04/06/2020 2026      Inpatient Medications  Scheduled Meds: . sodium chloride   Intravenous Once  . allopurinol  100 mg Oral Daily  . atorvastatin  40 mg Oral q1800  . buPROPion  150 mg Oral BID  . calcitRIOL  2.25 mcg Oral Q T,Th,Sat-1800  . Chlorhexidine Gluconate Cloth  6 each Topical Q0600  . darbepoetin (ARANESP) injection - DIALYSIS  150 mcg Intravenous Q Sat-HD  . feeding supplement  1 Container Oral TID BM  . gabapentin  300 mg Oral QHS  . linaclotide  72 mcg Oral QAC breakfast  . loratadine  10 mg Oral Daily  . metoprolol tartrate  25 mg Oral BID  . pantoprazole  40 mg Oral Daily  . polyethylene glycol  17 g Oral BID  . sucroferric oxyhydroxide  500 mg Oral TID WC   Continuous Infusions: . sodium chloride     PRN Meds:.sodium chloride, acetaminophen, albuterol, alteplase, bisacodyl, Gerhardt's butt cream, heparin, lidocaine (PF), lidocaine-prilocaine, metoprolol tartrate, mometasone-formoterol, nitroGLYCERIN, oxyCODONE, pentafluoroprop-tetrafluoroeth, sodium chloride flush   Time Spent in minutes 25   See all Orders from today for further details   Lala Lund M.D on 05/20/2020 at 11:36 AM  To page go to [email or web address] - use universal password  [facility] -  Office  [phone]    Objective:   Vitals:   05/06/20 2137 05/06/2020 0000 04/30/2020 0400 05/06/2020 0757  BP: (!) 86/61 97/66 (!) 127/59 (!) 168/87  Pulse: 80 69 77 80    Resp:  19 20 15   Temp: 99.1 F (37.3 C) 98.3 F (36.8 C) 98.9 F (37.2 C) 98.5 F (36.9 C)  TempSrc:  Oral Oral Oral  SpO2:  95% 100% 99%  Weight:   112.5 kg   Height:        Wt Readings from Last 3 Encounters:  05/13/2020 112.5 kg  04/19/20 118.4 kg  03/02/20 120.2 kg     Intake/Output Summary (Last 24 hours) at 05/24/2020 1136 Last data filed at 04/30/2020 0018 Gross per 24 hour  Intake 120 ml  Output 675 ml  Net -555 ml    Physical Exam  Awake Alert, No new F.N deficits,   [place].AT,PERRAL Supple Neck,No JVD, No cervical lymphadenopathy appriciated.  Symmetrical Chest wall movement, Good air movement bilaterally, CTAB RRR,No Gallops, Rubs or new Murmurs, No Parasternal Heave +ve B.Sounds, Abd Soft, No tenderness, No organomegaly appriciated, No rebound - guarding or rigidity. No Cyanosis,   both feet wrapped under bandage    Data Review:    CBC Recent Labs  Lab 05/04/20 0630 05/04/20 1548 04/30/2020 0840 05/06/20 0632 05/13/2020 0320  WBC 6.2 7.4 7.0 7.6 7.3  HGB 7.3* 7.4* 7.3* 8.0* 7.5*  HCT 24.1* 25.2* 24.0* 25.4*  24.5*  PLT 269 287 295 242 266  MCV 88.6 91.0 91.6 88.8 91.8  MCH 26.8 26.7 27.9 28.0 28.1  MCHC 30.3 29.4* 30.4 31.5 30.6  RDW 19.0* 19.1* 19.4* 18.5* 18.9*  LYMPHSABS  --   --   --   --  1.0  MONOABS  --   --   --   --  0.6  EOSABS  --   --   --   --  0.0  BASOSABS  --   --   --   --  0.0    Chemistries  Recent Labs  Lab 05/20/2020 0256 05/04/20 0630 05/13/2020 0840 05/06/20 0632 05/20/2020 0320  NA 137 139 139 139 141  K 3.8 4.0 3.8 3.6 3.7  CL 101 100 102 100 102  CO2 25 25 22 23 23   GLUCOSE 93 125* 90 106* 93  BUN 19 8 18 13  25*  CREATININE 6.48* 4.10* 5.86* 3.94* 5.68*  CALCIUM 7.2* 7.4* 7.6* 7.7* 7.5*   ------------------------------------------------------------------------------------------------------------------ No results for input(s): CHOL, HDL, LDLCALC, TRIG, CHOLHDL, LDLDIRECT in the last 72 hours.  Lab Results   Component Value Date   HGBA1C 5.1 04/03/2020   ------------------------------------------------------------------------------------------------------------------ No results for input(s): TSH, T4TOTAL, T3FREE, THYROIDAB in the last 72 hours.  Invalid input(s): FREET3 ------------------------------------------------------------------------------------------------------------------ No results for input(s): VITAMINB12, FOLATE, FERRITIN, TIBC, IRON, RETICCTPCT in the last 72 hours.  Coagulation profile No results for input(s): INR, PROTIME in the last 168 hours.  No results for input(s): DDIMER in the last 72 hours.  Cardiac Enzymes No results for input(s): CKMB, TROPONINI, MYOGLOBIN in the last 168 hours.  Invalid input(s): CK ------------------------------------------------------------------------------------------------------------------    Component Value Date/Time   BNP 897.8 (H) 01/01/2020 0600    Micro Results No results found for this or any previous visit (from the past 240 hour(s)).  Radiology Reports DG Chest 2 View  Result Date: 04/09/2020 CLINICAL DATA:  69 year old female with suspected sepsis. Bilateral foot infection for the past 3 weeks. EXAM: CHEST - 2 VIEW COMPARISON:  Chest x-ray 04/04/2020. FINDINGS: Lung volumes are normal. No consolidative airspace disease. No pleural effusions. No evidence of frank pulmonary edema. Mild cephalization of the pulmonary vasculature. Mild cardiomegaly. Upper mediastinal contours are within normal limits. Aortic atherosclerosis. Status post median sternotomy for CABG. Right internal jugular PermCath with tip terminating in the mid to distal superior vena cava. Aortic atherosclerosis. IMPRESSION: 1. Postoperative changes and support apparatus, as above. 2. Mild cardiomegaly with pulmonary venous congestion, without frank pulmonary edema. 3. Aortic atherosclerosis. Electronically Signed   By: Vinnie Langton M.D.   On: 03/29/2020 14:52    DG Abd 1 View  Result Date: 05/04/2020 CLINICAL DATA:  Possible retained small-bowel capsule EXAM: ABDOMEN - 1 VIEW COMPARISON:  04/10/2020, CT 05/07/2020 FINDINGS: Nonobstructed gas pattern. Surgical clips in the right lower quadrant. Calcified pelvic phleboliths. Barrel-shaped metallic opacity measuring 17 mm projecting over expected location of distal esophagus/GE junction. IMPRESSION: 1. 17 mm metallic barrel-shaped opacity projects over the expected location of distal esophagus or GE junction. No additional metallic radiopaque foreign bodies are visible over the included abdomen or pelvis. Electronically Signed   By: Donavan Foil M.D.   On: 05/04/2020 17:39   NM GI Blood Loss  Result Date: 04/29/2020 CLINICAL DATA:  Hemoglobin with large bloody stools. EXAM: NUCLEAR MEDICINE GASTROINTESTINAL BLEEDING SCAN TECHNIQUE: Sequential abdominal images were obtained following intravenous administration of Tc-69m labeled red blood cells. RADIOPHARMACEUTICALS:  26.2 mCi Tc-60m pertechnetate in-vitro labeled red cells. COMPARISON:  None. FINDINGS: It should be noted that the study is limited secondary to an extensive amount of patient motion. Normal accumulation of tracer is seen within the vascular system. No areas of abnormal tracer uptake are seen within the large and small bowel. Tracer activity is seen extending across the right abdomen which represents contaminated tracer uptake within the patient's right hand. IMPRESSION: 1. Limited study, as described above, without evidence of active gastrointestinal bleeding at the time of the exam. Electronically Signed   By: Virgina Norfolk M.D.   On: 05/11/2020 23:36   CT FOOT RIGHT WO CONTRAST  Result Date: 04/12/2020 CLINICAL DATA:  Pain and swelling. EXAM: CT OF THE RIGHT FOOT WITHOUT CONTRAST TECHNIQUE: Multidetector CT imaging of the right foot was performed according to the standard protocol. Multiplanar CT image reconstructions were also generated.  COMPARISON:  Radiographs 04/09/2020 FINDINGS: Diffuse subcutaneous soft tissue swelling/edema/fluid involving the entire ankle and foot. No focal fluid collection to suggest a drainable soft tissue abscess. Marked fatty atrophy of the foot and ankle musculature. Suspect myofasciitis involving the plantar foot musculature but no definite findings for pyomyositis. Suspect an open wound along the plantar aspect of the great toe but I do not see any obvious destructive bony changes to suggest osteomyelitis. No definite findings for septic arthritis. The other bony structures of the foot and ankle appear intact. No obvious destructive bony changes, fractures or osteochondral lesions. Extensive small artery calcifications. IMPRESSION: 1. Diffuse subcutaneous soft tissue swelling/edema/fluid involving the entire ankle and foot consistent with cellulitis. No focal fluid collection to suggest a drainable soft tissue abscess. 2. Suspect myofasciitis involving the plantar foot musculature but no definite findings for pyomyositis. Severe diffuse fatty atrophy of the foot and ankle musculature. 3. Suspect an open wound along the plantar aspect of the great toe but I do not see any obvious destructive bony changes to suggest osteomyelitis. No definite findings for septic arthritis. 4. If symptoms persist or worsen MRI is suggested for further evaluation. Electronically Signed   By: Marijo Sanes M.D.   On: 04/12/2020 10:51   DG ABD ACUTE 2+V W 1V CHEST  Result Date: 04/09/2020 CLINICAL DATA:  Fever with low hemoglobin. EXAM: DG ABDOMEN ACUTE W/ 1V CHEST COMPARISON:  Abdominal film 06/26/2018 and chest x-ray 02/24/2020 FINDINGS: Right IJ central venous catheter unchanged with tip over the SVC. Lungs are adequately inflated with minimal hazy prominence of the perihilar vessels unchanged and likely due to mild chronic vascular congestion. No lobar consolidation or effusion. Mild stable cardiomegaly. Remainder the chest is  unchanged. There are several air-filled large and small bowel loops. A few air-filled small bowel loops at the upper limits of normal in diameter over the left abdomen. No evidence of air-fluid levels or free peritoneal air. Remainder of the exam is unchanged. IMPRESSION: Nonspecific, nonobstructive bowel gas pattern with a few air-filled mildly prominent, but nondilated small bowel loops in the left abdomen. Stable mild cardiomegaly with suggestion of minimal chronic vascular congestion. Electronically Signed   By: Marin Olp M.D.   On: 04/09/2020 09:57   DG Abd Portable 1V  Result Date: 05/06/2020 CLINICAL DATA:  Altered mental status. COVID-19 positive. Apparent recurrent upper gastrointestinal bleeding EXAM: PORTABLE ABDOMEN - 1 VIEW COMPARISON:  May 04, 2020. FINDINGS: Previously noted radiopaque foreign body is now in or overlying the distal small bowel in the right upper pelvic region. There is no bowel dilatation or air-fluid level to suggest bowel obstruction. No free air. There are apparent phleboliths  in the pelvis. IMPRESSION: Metallic foreign body in or overlying the distal small bowel region. No bowel obstruction or free air. Electronically Signed   By: Lowella Grip III M.D.   On: 05/06/2020 11:13   DG Foot Complete Left  Result Date: 04/18/2020 CLINICAL DATA:  Recent amputation. Suspect sepsis. Bilateral foot infection 3 weeks. EXAM: LEFT FOOT - COMPLETE 3+ VIEW COMPARISON:  04/09/2020 FINDINGS: There is generalized decreased bone mineralization. Evidence of patient's recent amputation distal to the distal shaft of the first metatarsal. Expected postsurgical changes at the amputation site. No focal bone destruction to suggest osteomyelitis. No air within the soft tissues. Exam is otherwise unchanged. IMPRESSION: Expected changes post amputation distal to the distal aspect of the first metatarsal. No definite bone destruction to suggest osteomyelitis. No air within the soft tissues.  Electronically Signed   By: Marin Olp M.D.   On: 04/05/2020 14:58   DG Foot Complete Left  Result Date: 04/09/2020 CLINICAL DATA:  Foot pain and swelling EXAM: LEFT FOOT - COMPLETE 3+ VIEW COMPARISON:  02/01/2020 MRI, 01/31/2020 plain film FINDINGS: There is been interval amputation of the first toe. Persistent soft tissue wound is seen. There is lucency identified in the head of the first metatarsal suspicious for recurrent osteomyelitis. No other fracture or dislocation is seen. No other soft tissue abnormality is noted. IMPRESSION: Changes suspicious for osteomyelitis in the head of the first metatarsal. Overlying soft tissue wound is noted. Electronically Signed   By: Inez Catalina M.D.   On: 04/09/2020 19:40   DG Foot Complete Right  Result Date: 04/24/2020 CLINICAL DATA:  Recent amputation with possible infection three weeks. Suspect sepsis. EXAM: RIGHT FOOT COMPLETE - 3+ VIEW COMPARISON:  CT 04/12/2020 FINDINGS: Diffuse decreased bone mineralization. Degenerative change over the first MTP joint and interphalangeal joints. Mild horizontal sclerosis with subtle lucency involving the head of the fifth metatarsal possibly subacute fracture. No focal bone destruction to suggest osteomyelitis. No air within the soft tissues. IMPRESSION: 1. No definite evidence of osteomyelitis and no air within the soft tissues. 2. Linear sclerosis with subtle lucency involving the head of the fifth metatarsal which may be due to nondisplaced subacute fracture. Electronically Signed   By: Marin Olp M.D.   On: 04/18/2020 14:56   VAS [place] LOWER EXTREMITY VENOUS (DVT)  Result Date: 04/10/2020  Lower Venous DVTStudy Indications: Edema, and fever.  Limitations: Body habitus and poor ultrasound/tissue interface. Comparison Study: 04/28/19 previous Performing Technologist: Abram Sander RVS  Examination Guidelines: A complete evaluation includes B-mode imaging, spectral Doppler, color Doppler, and power Doppler as needed of  all accessible portions of each vessel. Bilateral testing is considered an integral part of a complete examination. Limited examinations for reoccurring indications may be performed as noted. The reflux portion of the exam is performed with the patient in reverse Trendelenburg.  +---------+---------------+---------+-----------+----------+--------------+ RIGHT    CompressibilityPhasicitySpontaneityPropertiesThrombus Aging +---------+---------------+---------+-----------+----------+--------------+ CFV      Full           Yes      Yes                                 +---------+---------------+---------+-----------+----------+--------------+ SFJ      Full                                                        +---------+---------------+---------+-----------+----------+--------------+  FV Prox  Full                                                        +---------+---------------+---------+-----------+----------+--------------+ FV Mid   Full                                                        +---------+---------------+---------+-----------+----------+--------------+ FV DistalFull                                                        +---------+---------------+---------+-----------+----------+--------------+ PFV      Full                                                        +---------+---------------+---------+-----------+----------+--------------+ POP      Full           Yes      Yes                                 +---------+---------------+---------+-----------+----------+--------------+ PTV      Full                                                        +---------+---------------+---------+-----------+----------+--------------+ PERO                                                  Not visualized +---------+---------------+---------+-----------+----------+--------------+    +---------+---------------+---------+-----------+----------+--------------+ LEFT     CompressibilityPhasicitySpontaneityPropertiesThrombus Aging +---------+---------------+---------+-----------+----------+--------------+ CFV      Full           Yes      Yes                                 +---------+---------------+---------+-----------+----------+--------------+ SFJ      Full                                                        +---------+---------------+---------+-----------+----------+--------------+ FV Prox  Full                                                        +---------+---------------+---------+-----------+----------+--------------+  FV Mid   Full                                                        +---------+---------------+---------+-----------+----------+--------------+ FV DistalFull                                                        +---------+---------------+---------+-----------+----------+--------------+ PFV      Full                                                        +---------+---------------+---------+-----------+----------+--------------+ POP      Full           Yes      Yes                                 +---------+---------------+---------+-----------+----------+--------------+ PTV      Full                                                        +---------+---------------+---------+-----------+----------+--------------+ PERO                                                  Not visualized +---------+---------------+---------+-----------+----------+--------------+     Summary: BILATERAL: - No evidence of deep vein thrombosis seen in the lower extremities, bilaterally.   *See table(s) above for measurements and observations. Electronically signed by Deitra Mayo MD on 04/10/2020 at 3:24:49 PM.    Final    CT Angio Abd/Pel w/ and/or w/o  Result Date: 05/04/2020 CLINICAL DATA:  GI bleed. Persistent  bleeding. EGD and colonoscopy were negative. EXAM: CTA ABDOMEN AND PELVIS WITHOUT AND WITH CONTRAST TECHNIQUE: Multidetector CT imaging of the abdomen and pelvis was performed using the standard protocol during bolus administration of intravenous contrast. Multiplanar reconstructed images and MIPs were obtained and reviewed to evaluate the vascular anatomy. CONTRAST:  163mL OMNIPAQUE IOHEXOL 350 MG/ML SOLN COMPARISON:  CT dated May 01, 2020 FINDINGS: VASCULAR Aorta: Again noted are atherosclerotic changes of the abdominal aorta with an infrarenal abdominal aortic aneurysm measuring approximately 3-3.2 cm. This is stable in appearance from prior study. Celiac: Atherosclerotic changes are noted of the celiac axis likely resulting in mild stenosis. SMA: Heavy calcifications are noted throughout the SMA likely resulting in tandem mild-to-moderate areas of stenosis. Renals: There are heavy calcifications at the origin of both renal arteries likely resulting in some degree of stenosis. IMA: The IMA is patent. Inflow: There is ectasia of the bilateral common iliac arteries as before. Proximal Outflow: Heavy atherosclerotic changes are noted. Veins: No obvious venous abnormality within the limitations of this arterial phase study. Review  of the MIP images confirms the above findings. NON-VASCULAR Lower chest: There is atelectasis at the lung bases.The heart is enlarged. There is reflux of contrast into the IVC. Hepatobiliary: The liver is normal. There appears to be gallbladder sludge or small stones without CT evidence for cholecystitis.There is no biliary ductal dilation. Pancreas: Normal contours without ductal dilatation. No peripancreatic fluid collection. Spleen: There is a small cystic structure in the spleen, of doubtful clinical significance. Adrenals/Urinary Tract: --Adrenal glands: Unremarkable. --Right kidney/ureter: The right kidney is atrophic without evidence for hydronephrosis. There may be a nonobstructing  stone in the lower pole. --Left kidney/ureter: The left kidney is atrophic without evidence for hydronephrosis. --Urinary bladder: There is a moderate amount of gas within the urinary bladder which may related to prior instrumentation. Stomach/Bowel: --Stomach/Duodenum: There is a small to moderate-sized hiatal hernia. The patient's reported small-bowel capsule is likely lodged proximal to the GE junction within the patient's hernia. --Small bowel: Unremarkable. --Colon: Liquid stool is noted within the patient's colon. This may be related to prior colonoscopy preparation. There is no evidence for active GI bleeding. A few clips are noted in the ascending colon, likely related to recent colonoscopy. --Appendix: Normal. Lymphatic: --No retroperitoneal lymphadenopathy. --No mesenteric lymphadenopathy. --No pelvic or inguinal lymphadenopathy. Reproductive: There is a 2.5 cm fluid attenuation structure that appears to be at the level of the proximal vagina/urethra. This is of doubtful clinical significance and may represent a Bartholin gland cyst or Skene duct cyst. Other: No ascites or free air. The abdominal wall is normal. Musculoskeletal. No acute displaced fractures. IMPRESSION: 1. No evidence for active GI bleeding. If there is slow active GI beading, a tagged red blood cell scan would be more sensitive for localization. 2. There is a small to moderate size hiatal hernia. The patient's reported small-bowel capsule is lodged within this hernia. 3. Cardiomegaly with reflux of contrast into the IVC, suggestive of right heart failure. 4. Atrophic kidneys. 5. There is a moderate amount of gas within the urinary bladder which may be related to prior instrumentation. 6. Stable vascular findings as described on the patient's CT from May 01, 2020. Aortic Atherosclerosis (ICD10-I70.0). Electronically Signed   By: Constance Holster M.D.   On: 05/04/2020 19:11   CT Angio Abd/Pel w/ and/or w/o  Result Date:  05/02/2020 CLINICAL DATA:  69 year old female currently COVID-19 positive with bleeding per rectum concerning for lower GI bleed. EXAM: CTA ABDOMEN AND PELVIS WITHOUT AND WITH CONTRAST TECHNIQUE: Multidetector CT imaging of the abdomen and pelvis was performed using the standard protocol during bolus administration of intravenous contrast. Multiplanar reconstructed images and MIPs were obtained and reviewed to evaluate the vascular anatomy. CONTRAST:  184mL OMNIPAQUE IOHEXOL 350 MG/ML SOLN COMPARISON:  Prior CT scan of the abdomen and pelvis 08/16/2019 FINDINGS: VASCULAR Aorta: Extensive heterogeneous mixed calcified and fibrofatty atherosclerotic plaque throughout the abdominal aorta. The infrarenal aorta is mildly aneurysmal at a maximum of 3 cm in diameter. Celiac: Calcified atherosclerotic plaque at the origin without significant stenosis. No aneurysm or dissection. SMA: Calcified atherosclerotic plaque results in at least moderate stenosis of the proximal SMA. Fibrofatty atherosclerotic plaque results in focal moderate stenosis in the mid SMA. Renals: There are 2 renal arteries bilaterally. All for renal arteries are heavily diseased with mixed calcified and fibrofatty atherosclerotic plaque. At least moderate stenoses present bilaterally worse on the left than the right. IMA: Heavily diseased with high-grade stenosis at the origin. The vessel opacifies distally. Inflow: Mixed calcified and fibrofatty atherosclerotic plaque  extends into the inflow vessels bilaterally. Both common iliac arteries are ectatic measuring up to 1.8 cm on the left and 2.1 cm on the right. Focal moderate stenoses at the origins of the internal iliac arteries. 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 No significant stenosis or occlusion of the external iliac arteries. Proximal Outflow: Heavily diseased bilaterally. Moderate to high-grade stenosis of the right common femoral artery. Bilateral profunda  femoral arteries are heavily diseased bilaterally. High-grade stenosis bordering on occlusion of the proximal right superficial femoral artery. Veins: No focal venous abnormality. Review of the MIP images confirms the above findings. NON-VASCULAR Lower chest: The visualized cardiac structures are normal in size. No pericardial effusion. Moderately large hiatal hernia. The lower lungs are clear. Hepatobiliary: Normal hepatic contour and morphology. Geographic hypoattenuation in the left hemi-liver adjacent to the fissure for the falciform ligament is nonspecific but most suggestive of benign focal fatty infiltration. No discrete solid hepatic lesion. Gallbladder is unremarkable. No intra or extrahepatic biliary ductal dilatation. Pancreas: Unremarkable. No pancreatic ductal dilatation or surrounding inflammatory changes. Spleen: 0.9 cm circumscribed low-attenuation lesion in the posteromedial aspect of the spleen is too small for accurate characterization. Statistically, this is almost certainly a benign lesion such as a cyst. Adrenals/Urinary Tract: Normal adrenal glands. Renal atrophy bilaterally worse on the left than the right likely secondary to chronic ischemia related to renal artery stenoses. 1.0 cm low-attenuation lesion in the right interpolar kidney is too small for accurate characterization but remains unchanged compared to prior studies and is almost certainly benign. Right lower pole renal sinus cyst, stable. Air-fluid level present within the bladder. Additionally, there is a semi lunar collection of fluid surrounding the urethra. Stomach/Bowel: No evidence of contrast extravasation into bowel on either the arterial or portal venous phase. Negative for acute bleeding at this time. No significant diverticular disease. Stomach and duodenum are unremarkable. No evidence of bowel wall thickening or obstruction. A metallic clip is present in the ascending colon. Lymphatic: No suspicious lymphadenopathy.  Reproductive: Dystrophic calcified uterine fibroid. No adnexal masses. Other: No significant abdominal wall hernia. No evidence of ascites. Musculoskeletal: No acute fracture or aggressive appearing lytic or blastic osseous lesion. Lower lumbar degenerative disc disease most notable at L5-S1. IMPRESSION: VASCULAR 1. No evidence of active GI bleeding. 2. 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 Of note, the pattern of disease is not suggestive of chronic mesenteric ischemia. 3. 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 4. 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 5. 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 Recommend followup by ultrasound in 3 years. This recommendation follows ACR consensus guidelines: White Paper of the ACR Incidental Findings Committee II on Vascular Findings. J Am Coll Radiol 2013; 10:789-794. Aortic aneurysm NOS (ICD10-I71.9); Aortic Atherosclerosis (ICD10-I70.0). NON-VASCULAR 1. No evidence of bowel wall thickening, inflammation or significant diverticulosis. 2. Metallic clip visible in the ascending colon. 3. Bilateral renal atrophy, likely secondary to chronic ischemia related renal artery stenoses. 4. Air-fluid level in the bladder is presumably related to recent instrumentation. If the patient has not been recently catheterized, then cystitis with a gas-forming organism would be a consideration. 5. Urethrocele. 6. Moderate hiatal hernia. 7. Lower lumbar degenerative disc disease. Electronically Signed   By: Jacqulynn Cadet M.D.   On: 05/02/2020 08:47

## 2020-05-08 LAB — CBC WITH DIFFERENTIAL/PLATELET
Abs Immature Granulocytes: 0.07 10*3/uL (ref 0.00–0.07)
Basophils Absolute: 0 10*3/uL (ref 0.0–0.1)
Basophils Relative: 0 %
Eosinophils Absolute: 0 10*3/uL (ref 0.0–0.5)
Eosinophils Relative: 0 %
HCT: 21.8 % — ABNORMAL LOW (ref 36.0–46.0)
Hemoglobin: 6.6 g/dL — CL (ref 12.0–15.0)
Immature Granulocytes: 1 %
Lymphocytes Relative: 11 %
Lymphs Abs: 0.7 10*3/uL (ref 0.7–4.0)
MCH: 27 pg (ref 26.0–34.0)
MCHC: 30.3 g/dL (ref 30.0–36.0)
MCV: 89.3 fL (ref 80.0–100.0)
Monocytes Absolute: 0.4 10*3/uL (ref 0.1–1.0)
Monocytes Relative: 5 %
Neutro Abs: 5.5 10*3/uL (ref 1.7–7.7)
Neutrophils Relative %: 83 %
Platelets: 246 10*3/uL (ref 150–400)
RBC: 2.44 MIL/uL — ABNORMAL LOW (ref 3.87–5.11)
RDW: 18.7 % — ABNORMAL HIGH (ref 11.5–15.5)
WBC: 6.6 10*3/uL (ref 4.0–10.5)
nRBC: 2 % — ABNORMAL HIGH (ref 0.0–0.2)

## 2020-05-08 LAB — PREPARE RBC (CROSSMATCH)

## 2020-05-08 LAB — GLUCOSE, CAPILLARY
Glucose-Capillary: 136 mg/dL — ABNORMAL HIGH (ref 70–99)
Glucose-Capillary: 188 mg/dL — ABNORMAL HIGH (ref 70–99)
Glucose-Capillary: 181 mg/dL — ABNORMAL HIGH (ref 70–99)
Glucose-Capillary: 209 mg/dL — ABNORMAL HIGH (ref 70–99)

## 2020-05-08 LAB — RENAL FUNCTION PANEL
Albumin: 1.7 g/dL — ABNORMAL LOW (ref 3.5–5.0)
Anion gap: 15 (ref 5–15)
BUN: 38 mg/dL — ABNORMAL HIGH (ref 8–23)
CO2: 23 mmol/L (ref 22–32)
Calcium: 7.3 mg/dL — ABNORMAL LOW (ref 8.9–10.3)
Chloride: 101 mmol/L (ref 98–111)
Creatinine, Ser: 7.49 mg/dL — ABNORMAL HIGH (ref 0.44–1.00)
GFR calc Af Amer: 6 mL/min — ABNORMAL LOW (ref >60)
GFR calc non Af Amer: 5 mL/min — ABNORMAL LOW (ref >60)
Glucose, Bld: 114 mg/dL — ABNORMAL HIGH (ref 70–99)
Phosphorus: 5.3 mg/dL — ABNORMAL HIGH (ref 2.5–4.6)
Potassium: 4.1 mmol/L (ref 3.5–5.1)
Sodium: 139 mmol/L (ref 135–145)

## 2020-05-08 LAB — HEMOGLOBIN AND HEMATOCRIT, BLOOD
HCT: 33.5 % — ABNORMAL LOW (ref 36.0–46.0)
Hemoglobin: 10.8 g/dL — ABNORMAL LOW (ref 12.0–15.0)

## 2020-05-08 MED ORDER — SODIUM CHLORIDE 0.9% IV SOLUTION: Freq: Once | INTRAVENOUS | Status: AC

## 2020-05-08 MED ORDER — IBUPROFEN 400 MG PO TABS
200.0000 mg | ORAL_TABLET | Freq: Once | ORAL | Status: AC
  Administered 2020-05-08: 200 mg via ORAL
  Filled 2020-05-08: qty 1

## 2020-05-08 NOTE — Progress Notes (Signed)
PT Cancellation Note  Patient Details Name: [name] [name] MRN: [id number] DOB: 08-May-1951   Cancelled Treatment:    Reason Eval/Treat Not Completed: Medical issues which prohibited therapy. Pt with 6.6 Hgb. MD order for 2 units PRBCs.   [name] [name] 05/08/2020, 11:52 AM  [name] [name], PT  Office # [phone] Pager [phone]

## 2020-05-08 NOTE — Plan of Care (Addendum)
Critical Hgb of 6.6 called to unit and MD made aware of results and vital sign.  Adam from HD called approximatly 10 minutes ago to get report on patient for HD. Now called back that they not getting patient till later this am.

## 2020-05-08 NOTE — Progress Notes (Signed)
[facility] Progress Note   Assessment/ Plan:   Dialysis:[facility] TTS =COVID shift, will go back to MWF ,4h 64min 400/800 118kg 2/2.5 bath Hep none TDC. Venofer 100 q HD until 4/17 Mircera 225 (last 4/1) Sensipar 30 Calcitriol 2.25  # Osteomyelitis left foot/ ischemic changes right KDX:IPJA by [facility], pt refused revasc/ angio/ amputation on 4/29, was rescheduled for 5/3 but postponed given continued GIB issues, may not be able to tolerate antiplatelet agents.   # Hx PAD/ recent L 1st toe amp - w/ non-healing wound, per [facility] plan is for debridement when pt will consent for procedure. and once GI issues resolved  #Acute blood loss anemia due to LGIB: Prior history AVM's, seen by gastroenterology withcolonoscopy done 5/4 w/ blood throughout the colon, unclear source. Tagged scan 5/8 negative. Pt has refused prep, palliative care now involved.  Hgb now 6.6, getting 1 u pRBCs.      # ESRD TTS: On [facility] while here. No issue with access.  # Secondary hyperparathyroidism: P boerline low reduce dose of Velphoro.  Continue calcitriol.  Calcium level low and sensipar dc'd  # HTN/volume: Blood pressure soft today with low Hgb.  On metoprolol--> hold for soft pressure.  Subjective:    Hgb 6.6 this AM with soft pressures.  Pall care noted- family meeting for 5/12.  Got 1 u pRBCs and BP better.     Objective:   BP 105/66   Pulse (!) 48   Temp 97.7 F (36.5 C) (Oral)   Resp 20   Ht 5\' 7"  (1.702 m)   Wt 112.5 kg   SpO2 100%   BMI 38.85 kg/m   Physical Exam:  Not examined d/t COVID + status, see exam from 5/10 Gen: NAD, sitting in bed CVS: RRR no m/r/g Resp: clear bilaterally no c/w/r Abd: soft Ext: R foot/ leg dressed  Labs: BMET Recent Labs  Lab 05/02/20 0749 05/15/2020 0256 05/04/20 0630 05/04/2020 0840 05/06/20 0632 05/17/2020 0320 05/08/20 0401  NA 138 137 139 139 139 141 139  K 4.3 3.8 4.0 3.8 3.6 3.7 4.1  CL 99 101 100 102 100 102 101  CO2 25 25 25  22 23 23 23   GLUCOSE 74 93 125* 90 106* 93 114*  BUN 15 19 8 18 13  25* 38*  CREATININE 5.08* 6.48* 4.10* 5.86* 3.94* 5.68* 7.49*  CALCIUM 7.7* 7.2* 7.4* 7.6* 7.7* 7.5* 7.3*  PHOS 3.1 3.4 2.4* 3.8 2.6 3.9 5.3*   CBC Recent Labs  Lab 05/15/2020 0840 05/06/20 0632 05/15/2020 0320 05/08/20 0401  WBC 7.0 7.6 7.3 6.6  NEUTROABS  --   --  5.6 5.5  HGB 7.3* 8.0* 7.5* 6.6*  HCT 24.0* 25.4* 24.5* 21.8*  MCV 91.6 88.8 91.8 89.3  PLT 295 242 266 246      Medications:    . allopurinol  100 mg Oral Daily  . atorvastatin  40 mg Oral q1800  . buPROPion  150 mg Oral BID  . calcitRIOL  2.25 mcg Oral Q T,Th,Sat-1800  . Chlorhexidine Gluconate Cloth  6 each Topical Q0600  . darbepoetin (ARANESP) injection - DIALYSIS  150 mcg Intravenous Q Sat-HD  . feeding supplement  1 Container Oral TID BM  . gabapentin  300 mg Oral QHS  . linaclotide  72 mcg Oral QAC breakfast  . loratadine  10 mg Oral Daily  . metoprolol tartrate  25 mg Oral BID  . pantoprazole  40 mg Oral Daily  . polyethylene glycol  17 g  Oral BID  . sucroferric oxyhydroxide  500 mg Oral TID WC     Madelon Lips, MD [facility] pgr [phone] 05/08/2020, 1:09 PM

## 2020-05-08 NOTE — Progress Notes (Signed)
HD orders modified to 05/09/2020 by Dr Hollie Salk. Informed Primary RN , Gwyneth Sprout at [phone] of above

## 2020-05-08 NOTE — Progress Notes (Signed)
PROGRESS NOTE                                                                                                                                                                                                             Patient Demographics:    [name] [name], is a 69 y.o. female, DOB - 1951/07/24, NIO:[id number]  Outpatient Primary MD for the patient is [place] date - 04/04/2020   LOS - 13  Chief Complaint  Patient presents with  . Altered Mental Status       Brief Narrative: Patient is a 69 y.o. 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  She was also incidentally found to have COVID-19.  Hospital course complicated by recurrent GI bleeding of obscure etiology-see below.  Significant Events: 4/6-4/22>> admit to [facility] for upper GI bleeding and blood loss anemia, left great toe osteo s/p amputation 4/28>> admit to [facility] for acute metabolic encephalopathy 8/18>> refused to sign consent for angiogram/amputation-rescheduled for 5/3 4/30>> bloody stools  5/2>> colonoscopy postponed-incomplete prep-due to refusal to complete bowel prep 5/3>> colonoscopy postponed-incomplete prep-due to refusal to complete bowel prep, lower extremity angiogram postponed-due to GI bleeding. 5/4>> colonoscopy- residual blood in the entire colon 5/6>> capsule endoscopy-unfortunately capsule retained in the stomach 5/8>> EGD with capsule placement in small bowel postponed as patient refused to drink prep on 5/7 pm 5/8>> tagged RBC scan negative  Procedures/studies: 5/7>> repeat CT mesenteric angiogram: No active bleeding source 5/6>>capsule  endoscopy>> capsule retained in the stomach 5/4>> colonoscopy-residual blood throughout: 5/4>> CT mesenteric angiogram: No active bleeding source 4/14>> left great toe amputation 4/10>> EGD/small bowel enteroscopy>> AVM third portion of duodenum  COVID-19 medications: Remdesivir: 4/28>> 4/29  Antibiotics: Vancomycin: 4/28>>5/1 Zosyn: 4/28>>5/7  Microbiology data: 4/28: Blood culture>> no growth 4/28: Wound swab/superficial culture>> Pseudomonas  DVT prophylaxis: Hold Heparin 4/30 2/2 GI bleed - SCDs  Consults: VVS, renal,GI, Pall Care    Subjective:   Patient in bed, appears comfortable, denies any headache, no fever, no chest pain or pressure, no shortness of breath , no  abdominal pain. No focal weakness.    Assessment  & Plan :   Acute metabolic encephalopathy: Likely secondary to SIRS/right foot gangrene-uremia due to missed HD.  Significantly improved after treatment of SIRS/foot infection with gangrene and dialysis-now back to baseline.  Sepsis with right lower extremity critical limb ischemia: Afebrile- sepsis physiology has resolved-has gangrenous changes evident on exam-please see pictures taken on 4/28 and prior notes.  Blood cultures negative so far.    Superficial swab on admission positive for Pseudomonas-not sure how accurate this is.  Has completed a 10-day course of Zosyn with plans to watch off antibiotics.   Refused angiogram/amputation on 4/29-was rescheduled for 5/3-however with her ongoing GI bleeding-angiogram postponed for now. Very difficult situation-this patient has known history of AVMs and history of recurrent  GI bleeding requiring PRBC transfusion-and may not be able to tolerate long-term dual antiplatelet therapy.    Once GI work-up has been completed-we will need to notify vascular surgery (Dr. Servando Snare) - for angiogram/amputation needs.  History of PAD with recent left great toe amputation-now with nonhealing left foot wound: Will need  debridement with vascular surgery once her GI bleeding has resolved.  No longer on aspirin due to GI bleeding-remains on statin.  Recurrent upper GI bleeding-history of AVMs and acute blood loss anemia: Continues to have stuttering hematochezia/melena-requirement 3 units of PRBC so far last transfusion on 05/06/2020, will get another 2 units on 05/08/2020 as she is anemic again with hypotension.  Colonoscopy revealed changes suggestive of possible small bowel , negative tagged RBC scan on 05/28/2020.  First capsule endoscopy failed as the capsule got stuck in the stomach, plan to do EGD to retrieve old capsule and to place a new capsule in the duodenum however patient has been noncompliant with bowel prep and swallowing and repeat EGD has been rescheduled 3 times, finally we tried for the fourth time again on 05/06/2020 she had initially agreed to do the bowel prep but again at night refused it, EGD again canceled on 05/26/2020 this is the 3rd or 4th time that we have canceled her endoscopy procedure this admission.  Continue to monitor packed RBC.  GI on board.  Continue PPI for now.  Hold antiplatelets.  I explained and warned the patient that noncompliance with medical procedure can result in ongoing bleed with eventual stroke disability or death.  Kindly see capacity evaluation below, palliative care is having a family meeting on 05/09/2020 as well, supportive care till then.   Capacity Evaluation  -  Patient does  appear to have the capacity to make an informed decision and to refuse diagnostic tests and treatment.    Patient is able to express a choice of not undergoing bowel prep - EGD, clearly understand the risk and its affect of causing ongoing GI bleed and that it could result in disability and death.  Nursing staff present in the room when I had this detailed discussion again on 05/08/2020.       Hyperkalemia: Secondary to missed HD-resolved with HD.  Follow periodically.  ESRD: Nephrology  following and directing care. TTS - HD.  Anemia: Multifactorial-likely secondary to kidney disease-worsened by blood loss-we will transfuse another unit of PRBC today.  HTN: BP relatively well controlled-continue metoprolol  Paroxysmal atrial fibrillation/atrial flutter: Not a new diagnosis-Per discharge summary in October 2020-she also had atrial fibrillation  as well.  Given recurrent GI bleeding/anemia-not a good candidate for anticoagulation.  Continue rate control with metoprolol.  Chronic combined diastolic and systolic  heart failure (EF 45-50% by TTE on 04/28/2019): Volume status stable-diuresis with HD.  History of CVA: Continue statin-see above regarding aspirin and anticoagulation  Gout: Not in flare-continue allopurinol  Covid 19 Viral infection: Incidental, no symptoms or pneumonia on x-ray, monitor.  O2 requirements:  SpO2: 97 % O2 Flow Rate (L/min): 2 L/min   COVID-19 Labs: No results for input(s): DDIMER, FERRITIN, LDH, CRP in the last 72 hours.     Component Value Date/Time   BNP 897.8 (H) 01/01/2020 0600    No results for input(s): PROCALCITON in the last 168 hours.  Lab Results  Component Value Date   SARSCOV2NAA POSITIVE (A) 03/31/2020   [place] NEGATIVE 04/16/2020   [place] NEGATIVE 04/03/2020   [place] NEGATIVE 03/15/2020     DM-2 with hypoglycemia (A1C 5.1 on 04/03/2020): No further hypoglycemic episodes-continue to hold all insulin for now.  Not a good candidate for tight glycemic control-allow some amount of permissive hyperglycemia.  CBG (last 3)  Recent Labs    05/23/2020 1754 05/14/2020 2109 05/08/20 0745  GLUCAP 122* 164* 136*    Morbid Obesity: Estimated body mass index is 38.85 kg/m as calculated from the following:   Height as of this encounter: 5\' 7"  (1.702 m).   Weight as of this encounter: 112.5 kg.  Pressure Injury 05/19/2020 Ankle Posterior;Right Unstageable - Full thickness tissue loss in which the base of the injury is  covered by slough (yellow, tan, gray, green or brown) and/or eschar (tan, brown or black) in the wound bed. black/brown wound bed o (Active)  05/18/2020 1630  Location: Ankle  Location Orientation: Posterior;Right  Staging: Unstageable - Full thickness tissue loss in which the base of the injury is covered by slough (yellow, tan, gray, green or brown) and/or eschar (tan, brown or black) in the wound bed.  Wound Description (Comments): black/brown wound bed on heel  Present on Admission:     Condition - Guarded  Family Communication  : Previous MD discussed with patient's aunt over the phone 5/8-have asked her to talk to the patient and encourage patient to complete her bowel prep this evening.   Sister [name] [phone] in detail on 05/05/2020.  Code Status :  Full Code  Diet :  Diet Order            Diet clear liquid Room service appropriate? Yes; Fluid consistency: Thin  Diet effective now               Disposition Plan  :  Status is: Inpatient  Remains inpatient appropriate because:Inpatient level of care appropriate due to severity of illness  Dispo: The patient is from: SNF              Anticipated d/c is to: SNF              Anticipated d/c date is: > 3 days              Patient currently is not medically stable to d/c.  Barriers to discharge: Ongoing GI bleeding without obvious source-right foot gangrene on IV antibiotics-probably will require revascularization/amputation once GI bleeding is controlled.  Antimicorbials  :    Anti-infectives (From admission, onward)   Start     Dose/Rate Route Frequency Ordered Stop   04/28/20 1200  vancomycin (VANCOCIN) IVPB 1000 mg/200 mL premix  Status:  Discontinued     1,000 mg 200 mL/hr over 60 Minutes Intravenous Every T-Th-Sa (Hemodialysis) 04/27/20 1154 04/28/20 1218   04/26/20 1400  piperacillin-tazobactam (ZOSYN)  IVPB 2.25 g  Status:  Discontinued     2.25 g 100 mL/hr over 30 Minutes Intravenous Every 8 hours 04/26/20  1314 05/04/20 1524   04/26/20 1000  remdesivir 100 mg in sodium chloride 0.9 % 100 mL IVPB  Status:  Discontinued     100 mg 200 mL/hr over 30 Minutes Intravenous Daily 04/26/2020 1952 04/26/20 1105   04/23/2020 2030  remdesivir 200 mg in sodium chloride 0.9% 250 mL IVPB     200 mg 580 mL/hr over 30 Minutes Intravenous Once 04/16/2020 1952 04/10/2020 2327   04/07/2020 1515  vancomycin (VANCOCIN) 2,500 mg in sodium chloride 0.9 % 500 mL IVPB     2,500 mg 250 mL/hr over 120 Minutes Intravenous  Once 04/02/2020 1513 04/10/2020 2026   04/21/2020 1513  vancomycin variable dose per unstable renal function (pharmacist dosing)  Status:  Discontinued      Does not apply See admin instructions 04/16/2020 1514 04/28/20 1218   04/05/2020 1500  piperacillin-tazobactam (ZOSYN) IVPB 3.375 g     3.375 g 100 mL/hr over 30 Minutes Intravenous  Once 04/02/2020 1450 03/29/2020 2026      Inpatient Medications  Scheduled Meds: . allopurinol  100 mg Oral Daily  . atorvastatin  40 mg Oral q1800  . buPROPion  150 mg Oral BID  . calcitRIOL  2.25 mcg Oral Q T,Th,Sat-1800  . Chlorhexidine Gluconate Cloth  6 each Topical Q0600  . darbepoetin (ARANESP) injection - DIALYSIS  150 mcg Intravenous Q Sat-HD  . feeding supplement  1 Container Oral TID BM  . gabapentin  300 mg Oral QHS  . linaclotide  72 mcg Oral QAC breakfast  . loratadine  10 mg Oral Daily  . metoprolol tartrate  25 mg Oral BID  . pantoprazole  40 mg Oral Daily  . polyethylene glycol  17 g Oral BID  . sucroferric oxyhydroxide  500 mg Oral TID WC   Continuous Infusions: . sodium chloride     PRN Meds:.sodium chloride, acetaminophen, albuterol, alteplase, bisacodyl, Gerhardt's butt cream, heparin, lidocaine (PF), lidocaine-prilocaine, metoprolol tartrate, mometasone-formoterol, nitroGLYCERIN, oxyCODONE, pentafluoroprop-tetrafluoroeth, sodium chloride flush   Time Spent in minutes 25   See all Orders from today for further details   Lala Lund M.D on 05/08/2020  at 11:02 AM  To page go to [email or web address] - use universal password  [facility] -  Office  [phone]    Objective:   Vitals:   05/08/20 0727 05/08/20 0741 05/08/20 0914 05/08/20 0933  BP: (!) 73/37 (!) 101/37 (!) 63/55 (!) 83/68  Pulse: (!) 58   (!) 49  Resp: 16   16  Temp: 98.7 F (37.1 C)  98.2 F (36.8 C) 97.7 F (36.5 C)  TempSrc: Oral  Oral Oral  SpO2: 96%   97%  Weight:      Height:        Wt Readings from Last 3 Encounters:  05/06/2020 112.5 kg  04/19/20 118.4 kg  03/02/20 120.2 kg    No intake or output data in the 24 hours ending 05/08/20 1102  Physical Exam  Awake Alert, No new F.N deficits, Normal affect [place].AT,PERRAL Supple Neck,No JVD, No cervical lymphadenopathy appriciated.  Symmetrical Chest wall movement, Good air movement bilaterally, CTAB RRR,No Gallops, Rubs or new Murmurs, No Parasternal Heave +ve B.Sounds, Abd Soft, No tenderness, No organomegaly appriciated, No rebound - guarding or rigidity.  both feet wrapped under bandage    Data Review:    CBC Recent Labs  Lab 05/04/20 1548 05/06/2020  0840 05/06/20 0632 05/09/2020 0320 05/08/20 0401  WBC 7.4 7.0 7.6 7.3 6.6  HGB 7.4* 7.3* 8.0* 7.5* 6.6*  HCT 25.2* 24.0* 25.4* 24.5* 21.8*  PLT 287 295 242 266 246  MCV 91.0 91.6 88.8 91.8 89.3  MCH 26.7 27.9 28.0 28.1 27.0  MCHC 29.4* 30.4 31.5 30.6 30.3  RDW 19.1* 19.4* 18.5* 18.9* 18.7*  LYMPHSABS  --   --   --  1.0 0.7  MONOABS  --   --   --  0.6 0.4  EOSABS  --   --   --  0.0 0.0  BASOSABS  --   --   --  0.0 0.0    Chemistries  Recent Labs  Lab 05/04/20 0630 05/06/2020 0840 05/06/20 0632 05/22/2020 0320 05/08/20 0401  NA 139 139 139 141 139  K 4.0 3.8 3.6 3.7 4.1  CL 100 102 100 102 101  CO2 25 22 23 23 23   GLUCOSE 125* 90 106* 93 114*  BUN 8 18 13  25* 38*  CREATININE 4.10* 5.86* 3.94* 5.68* 7.49*  CALCIUM 7.4* 7.6* 7.7* 7.5* 7.3*    ------------------------------------------------------------------------------------------------------------------ No results for input(s): CHOL, HDL, LDLCALC, TRIG, CHOLHDL, LDLDIRECT in the last 72 hours.  Lab Results  Component Value Date   HGBA1C 5.1 04/03/2020   ------------------------------------------------------------------------------------------------------------------ No results for input(s): TSH, T4TOTAL, T3FREE, THYROIDAB in the last 72 hours.  Invalid input(s): FREET3 ------------------------------------------------------------------------------------------------------------------ No results for input(s): VITAMINB12, FOLATE, FERRITIN, TIBC, IRON, RETICCTPCT in the last 72 hours.  Coagulation profile No results for input(s): INR, PROTIME in the last 168 hours.  No results for input(s): DDIMER in the last 72 hours.  Cardiac Enzymes No results for input(s): CKMB, TROPONINI, MYOGLOBIN in the last 168 hours.  Invalid input(s): CK ------------------------------------------------------------------------------------------------------------------    Component Value Date/Time   BNP 897.8 (H) 01/01/2020 0600    Micro Results No results found for this or any previous visit (from the past 240 hour(s)).  Radiology Reports DG Chest 2 View  Result Date: 04/03/2020 CLINICAL DATA:  69 year old female with suspected sepsis. Bilateral foot infection for the past 3 weeks. EXAM: CHEST - 2 VIEW COMPARISON:  Chest x-ray 04/04/2020. FINDINGS: Lung volumes are normal. No consolidative airspace disease. No pleural effusions. No evidence of frank pulmonary edema. Mild cephalization of the pulmonary vasculature. Mild cardiomegaly. Upper mediastinal contours are within normal limits. Aortic atherosclerosis. Status post median sternotomy for CABG. Right internal jugular PermCath with tip terminating in the mid to distal superior vena cava. Aortic atherosclerosis. IMPRESSION: 1. Postoperative  changes and support apparatus, as above. 2. Mild cardiomegaly with pulmonary venous congestion, without frank pulmonary edema. 3. Aortic atherosclerosis. Electronically Signed   By: Vinnie Langton M.D.   On: 03/31/2020 14:52   DG Abd 1 View  Result Date: 05/04/2020 CLINICAL DATA:  Possible retained small-bowel capsule EXAM: ABDOMEN - 1 VIEW COMPARISON:  04/10/2020, CT 05/09/2020 FINDINGS: Nonobstructed gas pattern. Surgical clips in the right lower quadrant. Calcified pelvic phleboliths. Barrel-shaped metallic opacity measuring 17 mm projecting over expected location of distal esophagus/GE junction. IMPRESSION: 1. 17 mm metallic barrel-shaped opacity projects over the expected location of distal esophagus or GE junction. No additional metallic radiopaque foreign bodies are visible over the included abdomen or pelvis. Electronically Signed   By: Donavan Foil M.D.   On: 05/04/2020 17:39   NM GI Blood Loss  Result Date: 04/28/2020 CLINICAL DATA:  Hemoglobin with large bloody stools. EXAM: NUCLEAR MEDICINE GASTROINTESTINAL BLEEDING SCAN TECHNIQUE: Sequential abdominal images were obtained following intravenous  administration of Tc-29m labeled red blood cells. RADIOPHARMACEUTICALS:  26.2 mCi Tc-8m pertechnetate in-vitro labeled red cells. COMPARISON:  None. FINDINGS: It should be noted that the study is limited secondary to an extensive amount of patient motion. Normal accumulation of tracer is seen within the vascular system. No areas of abnormal tracer uptake are seen within the large and small bowel. Tracer activity is seen extending across the right abdomen which represents contaminated tracer uptake within the patient's right hand. IMPRESSION: 1. Limited study, as described above, without evidence of active gastrointestinal bleeding at the time of the exam. Electronically Signed   By: Virgina Norfolk M.D.   On: 05/19/2020 23:36   CT FOOT RIGHT WO CONTRAST  Result Date: 04/12/2020 CLINICAL DATA:  Pain  and swelling. EXAM: CT OF THE RIGHT FOOT WITHOUT CONTRAST TECHNIQUE: Multidetector CT imaging of the right foot was performed according to the standard protocol. Multiplanar CT image reconstructions were also generated. COMPARISON:  Radiographs 04/09/2020 FINDINGS: Diffuse subcutaneous soft tissue swelling/edema/fluid involving the entire ankle and foot. No focal fluid collection to suggest a drainable soft tissue abscess. Marked fatty atrophy of the foot and ankle musculature. Suspect myofasciitis involving the plantar foot musculature but no definite findings for pyomyositis. Suspect an open wound along the plantar aspect of the great toe but I do not see any obvious destructive bony changes to suggest osteomyelitis. No definite findings for septic arthritis. The other bony structures of the foot and ankle appear intact. No obvious destructive bony changes, fractures or osteochondral lesions. Extensive small artery calcifications. IMPRESSION: 1. Diffuse subcutaneous soft tissue swelling/edema/fluid involving the entire ankle and foot consistent with cellulitis. No focal fluid collection to suggest a drainable soft tissue abscess. 2. Suspect myofasciitis involving the plantar foot musculature but no definite findings for pyomyositis. Severe diffuse fatty atrophy of the foot and ankle musculature. 3. Suspect an open wound along the plantar aspect of the great toe but I do not see any obvious destructive bony changes to suggest osteomyelitis. No definite findings for septic arthritis. 4. If symptoms persist or worsen MRI is suggested for further evaluation. Electronically Signed   By: Marijo Sanes M.D.   On: 04/12/2020 10:51   DG ABD ACUTE 2+V W 1V CHEST  Result Date: 04/09/2020 CLINICAL DATA:  Fever with low hemoglobin. EXAM: DG ABDOMEN ACUTE W/ 1V CHEST COMPARISON:  Abdominal film 06/26/2018 and chest x-ray 02/24/2020 FINDINGS: Right IJ central venous catheter unchanged with tip over the SVC. Lungs are  adequately inflated with minimal hazy prominence of the perihilar vessels unchanged and likely due to mild chronic vascular congestion. No lobar consolidation or effusion. Mild stable cardiomegaly. Remainder the chest is unchanged. There are several air-filled large and small bowel loops. A few air-filled small bowel loops at the upper limits of normal in diameter over the left abdomen. No evidence of air-fluid levels or free peritoneal air. Remainder of the exam is unchanged. IMPRESSION: Nonspecific, nonobstructive bowel gas pattern with a few air-filled mildly prominent, but nondilated small bowel loops in the left abdomen. Stable mild cardiomegaly with suggestion of minimal chronic vascular congestion. Electronically Signed   By: Marin Olp M.D.   On: 04/09/2020 09:57   DG Abd Portable 1V  Result Date: 05/06/2020 CLINICAL DATA:  Altered mental status. COVID-19 positive. Apparent recurrent upper gastrointestinal bleeding EXAM: PORTABLE ABDOMEN - 1 VIEW COMPARISON:  May 04, 2020. FINDINGS: Previously noted radiopaque foreign body is now in or overlying the distal small bowel in the right upper pelvic region.  There is no bowel dilatation or air-fluid level to suggest bowel obstruction. No free air. There are apparent phleboliths in the pelvis. IMPRESSION: Metallic foreign body in or overlying the distal small bowel region. No bowel obstruction or free air. Electronically Signed   By: Lowella Grip III M.D.   On: 05/06/2020 11:13   DG Foot Complete Left  Result Date: 04/02/2020 CLINICAL DATA:  Recent amputation. Suspect sepsis. Bilateral foot infection 3 weeks. EXAM: LEFT FOOT - COMPLETE 3+ VIEW COMPARISON:  04/09/2020 FINDINGS: There is generalized decreased bone mineralization. Evidence of patient's recent amputation distal to the distal shaft of the first metatarsal. Expected postsurgical changes at the amputation site. No focal bone destruction to suggest osteomyelitis. No air within the soft  tissues. Exam is otherwise unchanged. IMPRESSION: Expected changes post amputation distal to the distal aspect of the first metatarsal. No definite bone destruction to suggest osteomyelitis. No air within the soft tissues. Electronically Signed   By: Marin Olp M.D.   On: 04/24/2020 14:58   DG Foot Complete Left  Result Date: 04/09/2020 CLINICAL DATA:  Foot pain and swelling EXAM: LEFT FOOT - COMPLETE 3+ VIEW COMPARISON:  02/01/2020 MRI, 01/31/2020 plain film FINDINGS: There is been interval amputation of the first toe. Persistent soft tissue wound is seen. There is lucency identified in the head of the first metatarsal suspicious for recurrent osteomyelitis. No other fracture or dislocation is seen. No other soft tissue abnormality is noted. IMPRESSION: Changes suspicious for osteomyelitis in the head of the first metatarsal. Overlying soft tissue wound is noted. Electronically Signed   By: Inez Catalina M.D.   On: 04/09/2020 19:40   DG Foot Complete Right  Result Date: 04/07/2020 CLINICAL DATA:  Recent amputation with possible infection three weeks. Suspect sepsis. EXAM: RIGHT FOOT COMPLETE - 3+ VIEW COMPARISON:  CT 04/12/2020 FINDINGS: Diffuse decreased bone mineralization. Degenerative change over the first MTP joint and interphalangeal joints. Mild horizontal sclerosis with subtle lucency involving the head of the fifth metatarsal possibly subacute fracture. No focal bone destruction to suggest osteomyelitis. No air within the soft tissues. IMPRESSION: 1. No definite evidence of osteomyelitis and no air within the soft tissues. 2. Linear sclerosis with subtle lucency involving the head of the fifth metatarsal which may be due to nondisplaced subacute fracture. Electronically Signed   By: Marin Olp M.D.   On: 04/09/2020 14:56   VAS [place] LOWER EXTREMITY VENOUS (DVT)  Result Date: 04/10/2020  Lower Venous DVTStudy Indications: Edema, and fever.  Limitations: Body habitus and poor ultrasound/tissue  interface. Comparison Study: 04/28/19 previous Performing Technologist: Abram Sander RVS  Examination Guidelines: A complete evaluation includes B-mode imaging, spectral Doppler, color Doppler, and power Doppler as needed of all accessible portions of each vessel. Bilateral testing is considered an integral part of a complete examination. Limited examinations for reoccurring indications may be performed as noted. The reflux portion of the exam is performed with the patient in reverse Trendelenburg.  +---------+---------------+---------+-----------+----------+--------------+ RIGHT    CompressibilityPhasicitySpontaneityPropertiesThrombus Aging +---------+---------------+---------+-----------+----------+--------------+ CFV      Full           Yes      Yes                                 +---------+---------------+---------+-----------+----------+--------------+ SFJ      Full                                                        +---------+---------------+---------+-----------+----------+--------------+  FV Prox  Full                                                        +---------+---------------+---------+-----------+----------+--------------+ FV Mid   Full                                                        +---------+---------------+---------+-----------+----------+--------------+ FV DistalFull                                                        +---------+---------------+---------+-----------+----------+--------------+ PFV      Full                                                        +---------+---------------+---------+-----------+----------+--------------+ POP      Full           Yes      Yes                                 +---------+---------------+---------+-----------+----------+--------------+ PTV      Full                                                        +---------+---------------+---------+-----------+----------+--------------+  PERO                                                  Not visualized +---------+---------------+---------+-----------+----------+--------------+   +---------+---------------+---------+-----------+----------+--------------+ LEFT     CompressibilityPhasicitySpontaneityPropertiesThrombus Aging +---------+---------------+---------+-----------+----------+--------------+ CFV      Full           Yes      Yes                                 +---------+---------------+---------+-----------+----------+--------------+ SFJ      Full                                                        +---------+---------------+---------+-----------+----------+--------------+ FV Prox  Full                                                        +---------+---------------+---------+-----------+----------+--------------+  FV Mid   Full                                                        +---------+---------------+---------+-----------+----------+--------------+ FV DistalFull                                                        +---------+---------------+---------+-----------+----------+--------------+ PFV      Full                                                        +---------+---------------+---------+-----------+----------+--------------+ POP      Full           Yes      Yes                                 +---------+---------------+---------+-----------+----------+--------------+ PTV      Full                                                        +---------+---------------+---------+-----------+----------+--------------+ PERO                                                  Not visualized +---------+---------------+---------+-----------+----------+--------------+     Summary: BILATERAL: - No evidence of deep vein thrombosis seen in the lower extremities, bilaterally.   *See table(s) above for measurements and observations. Electronically signed by Deitra Mayo MD on 04/10/2020 at 3:24:49 PM.    Final    CT Angio Abd/Pel w/ and/or w/o  Result Date: 05/04/2020 CLINICAL DATA:  GI bleed. Persistent bleeding. EGD and colonoscopy were negative. EXAM: CTA ABDOMEN AND PELVIS WITHOUT AND WITH CONTRAST TECHNIQUE: Multidetector CT imaging of the abdomen and pelvis was performed using the standard protocol during bolus administration of intravenous contrast. Multiplanar reconstructed images and MIPs were obtained and reviewed to evaluate the vascular anatomy. CONTRAST:  129mL OMNIPAQUE IOHEXOL 350 MG/ML SOLN COMPARISON:  CT dated May 01, 2020 FINDINGS: VASCULAR Aorta: Again noted are atherosclerotic changes of the abdominal aorta with an infrarenal abdominal aortic aneurysm measuring approximately 3-3.2 cm. This is stable in appearance from prior study. Celiac: Atherosclerotic changes are noted of the celiac axis likely resulting in mild stenosis. SMA: Heavy calcifications are noted throughout the SMA likely resulting in tandem mild-to-moderate areas of stenosis. Renals: There are heavy calcifications at the origin of both renal arteries likely resulting in some degree of stenosis. IMA: The IMA is patent. Inflow: There is ectasia of the bilateral common iliac arteries as before. Proximal Outflow: Heavy atherosclerotic changes are noted. Veins: No obvious venous abnormality within the limitations of this arterial phase study. Review  of the MIP images confirms the above findings. NON-VASCULAR Lower chest: There is atelectasis at the lung bases.The heart is enlarged. There is reflux of contrast into the IVC. Hepatobiliary: The liver is normal. There appears to be gallbladder sludge or small stones without CT evidence for cholecystitis.There is no biliary ductal dilation. Pancreas: Normal contours without ductal dilatation. No peripancreatic fluid collection. Spleen: There is a small cystic structure in the spleen, of doubtful clinical significance. Adrenals/Urinary Tract:  --Adrenal glands: Unremarkable. --Right kidney/ureter: The right kidney is atrophic without evidence for hydronephrosis. There may be a nonobstructing stone in the lower pole. --Left kidney/ureter: The left kidney is atrophic without evidence for hydronephrosis. --Urinary bladder: There is a moderate amount of gas within the urinary bladder which may related to prior instrumentation. Stomach/Bowel: --Stomach/Duodenum: There is a small to moderate-sized hiatal hernia. The patient's reported small-bowel capsule is likely lodged proximal to the GE junction within the patient's hernia. --Small bowel: Unremarkable. --Colon: Liquid stool is noted within the patient's colon. This may be related to prior colonoscopy preparation. There is no evidence for active GI bleeding. A few clips are noted in the ascending colon, likely related to recent colonoscopy. --Appendix: Normal. Lymphatic: --No retroperitoneal lymphadenopathy. --No mesenteric lymphadenopathy. --No pelvic or inguinal lymphadenopathy. Reproductive: There is a 2.5 cm fluid attenuation structure that appears to be at the level of the proximal vagina/urethra. This is of doubtful clinical significance and may represent a Bartholin gland cyst or Skene duct cyst. Other: No ascites or free air. The abdominal wall is normal. Musculoskeletal. No acute displaced fractures. IMPRESSION: 1. No evidence for active GI bleeding. If there is slow active GI beading, a tagged red blood cell scan would be more sensitive for localization. 2. There is a small to moderate size hiatal hernia. The patient's reported small-bowel capsule is lodged within this hernia. 3. Cardiomegaly with reflux of contrast into the IVC, suggestive of right heart failure. 4. Atrophic kidneys. 5. There is a moderate amount of gas within the urinary bladder which may be related to prior instrumentation. 6. Stable vascular findings as described on the patient's CT from May 01, 2020. Aortic Atherosclerosis  (ICD10-I70.0). Electronically Signed   By: Constance Holster M.D.   On: 05/04/2020 19:11   CT Angio Abd/Pel w/ and/or w/o  Result Date: 05/02/2020 CLINICAL DATA:  69 year old female currently COVID-19 positive with bleeding per rectum concerning for lower GI bleed. EXAM: CTA ABDOMEN AND PELVIS WITHOUT AND WITH CONTRAST TECHNIQUE: Multidetector CT imaging of the abdomen and pelvis was performed using the standard protocol during bolus administration of intravenous contrast. Multiplanar reconstructed images and MIPs were obtained and reviewed to evaluate the vascular anatomy. CONTRAST:  128mL OMNIPAQUE IOHEXOL 350 MG/ML SOLN COMPARISON:  Prior CT scan of the abdomen and pelvis 08/16/2019 FINDINGS: VASCULAR Aorta: Extensive heterogeneous mixed calcified and fibrofatty atherosclerotic plaque throughout the abdominal aorta. The infrarenal aorta is mildly aneurysmal at a maximum of 3 cm in diameter. Celiac: Calcified atherosclerotic plaque at the origin without significant stenosis. No aneurysm or dissection. SMA: Calcified atherosclerotic plaque results in at least moderate stenosis of the proximal SMA. Fibrofatty atherosclerotic plaque results in focal moderate stenosis in the mid SMA. Renals: There are 2 renal arteries bilaterally. All for renal arteries are heavily diseased with mixed calcified and fibrofatty atherosclerotic plaque. At least moderate stenoses present bilaterally worse on the left than the right. IMA: Heavily diseased with high-grade stenosis at the origin. The vessel opacifies distally. Inflow: Mixed calcified and fibrofatty atherosclerotic plaque  extends into the inflow vessels bilaterally. Both common iliac arteries are ectatic measuring up to 1.8 cm on the left and 2.1 cm on the right. Focal moderate stenoses at the origins of the internal iliac arteries. 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 No significant stenosis or occlusion of the external iliac arteries.  Proximal Outflow: Heavily diseased bilaterally. Moderate to high-grade stenosis of the right common femoral artery. Bilateral profunda femoral arteries are heavily diseased bilaterally. High-grade stenosis bordering on occlusion of the proximal right superficial femoral artery. Veins: No focal venous abnormality. Review of the MIP images confirms the above findings. NON-VASCULAR Lower chest: The visualized cardiac structures are normal in size. No pericardial effusion. Moderately large hiatal hernia. The lower lungs are clear. Hepatobiliary: Normal hepatic contour and morphology. Geographic hypoattenuation in the left hemi-liver adjacent to the fissure for the falciform ligament is nonspecific but most suggestive of benign focal fatty infiltration. No discrete solid hepatic lesion. Gallbladder is unremarkable. No intra or extrahepatic biliary ductal dilatation. Pancreas: Unremarkable. No pancreatic ductal dilatation or surrounding inflammatory changes. Spleen: 0.9 cm circumscribed low-attenuation lesion in the posteromedial aspect of the spleen is too small for accurate characterization. Statistically, this is almost certainly a benign lesion such as a cyst. Adrenals/Urinary Tract: Normal adrenal glands. Renal atrophy bilaterally worse on the left than the right likely secondary to chronic ischemia related to renal artery stenoses. 1.0 cm low-attenuation lesion in the right interpolar kidney is too small for accurate characterization but remains unchanged compared to prior studies and is almost certainly benign. Right lower pole renal sinus cyst, stable. Air-fluid level present within the bladder. Additionally, there is a semi lunar collection of fluid surrounding the urethra. Stomach/Bowel: No evidence of contrast extravasation into bowel on either the arterial or portal venous phase. Negative for acute bleeding at this time. No significant diverticular disease. Stomach and duodenum are unremarkable. No evidence of  bowel wall thickening or obstruction. A metallic clip is present in the ascending colon. Lymphatic: No suspicious lymphadenopathy. Reproductive: Dystrophic calcified uterine fibroid. No adnexal masses. Other: No significant abdominal wall hernia. No evidence of ascites. Musculoskeletal: No acute fracture or aggressive appearing lytic or blastic osseous lesion. Lower lumbar degenerative disc disease most notable at L5-S1. IMPRESSION: VASCULAR 1. No evidence of active GI bleeding. 2. 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 Of note, the pattern of disease is not suggestive of chronic mesenteric ischemia. 3. 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 4. 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 5. 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 Recommend followup by ultrasound in 3 years. This recommendation follows ACR consensus guidelines: White Paper of the ACR Incidental Findings Committee II on Vascular Findings. J Am Coll Radiol 2013; 10:789-794. Aortic aneurysm NOS (ICD10-I71.9); Aortic Atherosclerosis (ICD10-I70.0). NON-VASCULAR 1. No evidence of bowel wall thickening, inflammation or significant diverticulosis. 2. Metallic clip visible in the ascending colon. 3. Bilateral renal atrophy, likely secondary to chronic ischemia related renal artery stenoses. 4. Air-fluid level in the bladder is presumably related to recent instrumentation. If the patient has not been recently catheterized, then cystitis with a gas-forming organism would be a consideration. 5. Urethrocele. 6. Moderate hiatal hernia. 7. Lower lumbar degenerative disc disease. Electronically Signed   By: Jacqulynn Cadet M.D.   On: 05/02/2020 08:47

## 2020-05-08 NOTE — Progress Notes (Signed)
   05/08/20 0727  Assess: MEWS Score  Temp 98.7 F (37.1 C)  BP (!) 73/37  Pulse Rate (!) 58  Resp 16  Level of Consciousness Alert  SpO2 96 %  Assess: MEWS Score  MEWS Temp 0  MEWS Systolic 2  MEWS Pulse 0  MEWS RR 0  MEWS LOC 0  MEWS Score 2  MEWS Score Color Yellow  Assess: if the MEWS score is Yellow or Red  Were vital signs taken at a resting state? Yes  Focused Assessment Documented focused assessment  Early Detection of Sepsis Score *See Row Information* Low  MEWS guidelines implemented *See Row Information* No, previously yellow, continue vital signs every 4 hours  Notify: Provider  Provider Name/Title Dr. Candiss Norse   Date Provider Notified 05/08/20  Time Provider Notified [phone]  Notification Type Call  Notification Reason Change in status  Response See new orders  Date of Provider Response 05/08/20  Time of Provider Response [phone]   Verbal order from Dr. Candiss Norse to run first unit of blood in 1 hr.

## 2020-05-09 ENCOUNTER — Inpatient Hospital Stay (HOSPITAL_COMMUNITY): Payer: Medicare (Managed Care)

## 2020-05-09 DIAGNOSIS — Z66 Do not resuscitate: Secondary | ICD-10-CM

## 2020-05-09 DIAGNOSIS — R627 Adult failure to thrive: Secondary | ICD-10-CM

## 2020-05-09 LAB — CBC WITH DIFFERENTIAL/PLATELET
Abs Immature Granulocytes: 0.11 10*3/uL — ABNORMAL HIGH (ref 0.00–0.07)
Basophils Absolute: 0.1 10*3/uL (ref 0.0–0.1)
Basophils Relative: 0 %
Eosinophils Absolute: 0 10*3/uL (ref 0.0–0.5)
Eosinophils Relative: 0 %
HCT: 34.9 % — ABNORMAL LOW (ref 36.0–46.0)
Hemoglobin: 11.1 g/dL — ABNORMAL LOW (ref 12.0–15.0)
Immature Granulocytes: 1 %
Lymphocytes Relative: 4 %
Lymphs Abs: 0.6 10*3/uL — ABNORMAL LOW (ref 0.7–4.0)
MCH: 28.1 pg (ref 26.0–34.0)
MCHC: 31.8 g/dL (ref 30.0–36.0)
MCV: 88.4 fL (ref 80.0–100.0)
Monocytes Absolute: 0.4 10*3/uL (ref 0.1–1.0)
Monocytes Relative: 3 %
Neutro Abs: 11.5 10*3/uL — ABNORMAL HIGH (ref 1.7–7.7)
Neutrophils Relative %: 92 %
Platelets: 316 10*3/uL (ref 150–400)
RBC: 3.95 MIL/uL (ref 3.87–5.11)
RDW: 17.6 % — ABNORMAL HIGH (ref 11.5–15.5)
WBC: 12.6 10*3/uL — ABNORMAL HIGH (ref 4.0–10.5)
nRBC: 1.7 % — ABNORMAL HIGH (ref 0.0–0.2)

## 2020-05-09 LAB — BPAM RBC
Blood Product Expiration Date: 202106032359
Blood Product Expiration Date: 202106122359
ISSUE DATE / TIME: 202105110909
ISSUE DATE / TIME: 202105111229
Unit Type and Rh: 5100
Unit Type and Rh: 5100

## 2020-05-09 LAB — RENAL FUNCTION PANEL
Albumin: 2 g/dL — ABNORMAL LOW (ref 3.5–5.0)
Anion gap: 18 — ABNORMAL HIGH (ref 5–15)
BUN: 47 mg/dL — ABNORMAL HIGH (ref 8–23)
CO2: 21 mmol/L — ABNORMAL LOW (ref 22–32)
Calcium: 7.4 mg/dL — ABNORMAL LOW (ref 8.9–10.3)
Chloride: 99 mmol/L (ref 98–111)
Creatinine, Ser: 8.97 mg/dL — ABNORMAL HIGH (ref 0.44–1.00)
GFR calc Af Amer: 5 mL/min — ABNORMAL LOW (ref >60)
GFR calc non Af Amer: 4 mL/min — ABNORMAL LOW (ref >60)
Glucose, Bld: 182 mg/dL — ABNORMAL HIGH (ref 70–99)
Phosphorus: 7.3 mg/dL — ABNORMAL HIGH (ref 2.5–4.6)
Potassium: 5.7 mmol/L — ABNORMAL HIGH (ref 3.5–5.1)
Sodium: 138 mmol/L (ref 135–145)

## 2020-05-09 LAB — TYPE AND SCREEN
ABO/RH(D): O POS
Antibody Screen: NEGATIVE
Unit division: 0
Unit division: 0

## 2020-05-09 LAB — GLUCOSE, CAPILLARY
Glucose-Capillary: 208 mg/dL — ABNORMAL HIGH (ref 70–99)
Glucose-Capillary: 214 mg/dL — ABNORMAL HIGH (ref 70–99)
Glucose-Capillary: 55 mg/dL — ABNORMAL LOW (ref 70–99)
Glucose-Capillary: 61 mg/dL — ABNORMAL LOW (ref 70–99)
Glucose-Capillary: 175 mg/dL — ABNORMAL HIGH (ref 70–99)
Glucose-Capillary: 111 mg/dL — ABNORMAL HIGH (ref 70–99)

## 2020-05-09 MED ORDER — VANCOMYCIN HCL 2000 MG/400ML IV SOLN
2000.0000 mg | Freq: Once | INTRAVENOUS | Status: DC
  Filled 2020-05-09: qty 400

## 2020-05-09 MED ORDER — MIDODRINE HCL 5 MG PO TABS: 10.0000 mg | ORAL_TABLET | Freq: Once | ORAL | Status: AC

## 2020-05-09 MED ORDER — DEXTROSE 50 % IV SOLN
12.5000 g | INTRAVENOUS | Status: AC
  Administered 2020-05-09: 12.5 g via INTRAVENOUS
  Filled 2020-05-09: qty 50

## 2020-05-09 MED ORDER — SODIUM CHLORIDE 0.9 % IV SOLN: 2.0000 g | Freq: Once | INTRAVENOUS | Status: DC

## 2020-05-09 MED ORDER — VANCOMYCIN HCL IN DEXTROSE 1-5 GM/200ML-% IV SOLN: 1000.0000 mg | INTRAVENOUS | Status: DC

## 2020-05-09 MED ORDER — SODIUM CHLORIDE 0.9 % IV SOLN: 1.0000 g | INTRAVENOUS | Status: DC

## 2020-05-09 MED ORDER — SODIUM CHLORIDE 0.9 % IV SOLN
500.0000 mg | INTRAVENOUS | Status: DC
  Administered 2020-05-09: 500 mg via INTRAVENOUS
  Filled 2020-05-09: qty 500

## 2020-05-09 MED ORDER — MIDODRINE HCL 5 MG PO TABS
ORAL_TABLET | ORAL | Status: AC
  Administered 2020-05-09: 10 mg via ORAL
  Filled 2020-05-09: qty 2

## 2020-05-09 NOTE — Progress Notes (Signed)
OT Cancellation Note  Patient Details Name: [name] [name] MRN: [id number] DOB: 24-Dec-1951   Cancelled Treatment:    Reason Eval/Treat Not Completed: Patient at procedure or test/ unavailable.  Patient at dialysis.  Will check back as time allows.  August Luz, OTR/L   Phylliss Bob 05/09/2020, 1:24 PM

## 2020-05-09 NOTE — Progress Notes (Signed)
Ms. Moroz was seen for worsening respiratory status and worsening mental status.   She was dialyzed 3/12 with fluid-removal but continued to have worsening respiratory status despite this and has been increasingly lethargic.   She has increased WOB, is responding to pain only.   With new fever and leukocytosis, cultures and empiric antibiotics were ordered and FiO2 was increased.   Unfortunately, she has continued to worsen and now BP is declining.   I discussed the change in status with her sister Ms. Foust by phone who confirmed that the patient would want to be DNR/DNI. Ms. Otilio Miu understands that Yocelin Vanlue may not make it through the night. She expressed gratitude for the care that has been provided, will contact the other siblings now, and said that we can call her anytime with updates.   With SBP now in 50s, will try a small fluid bolus and albumin though this could certainly worsen the respiratory decline. Fortunately, the patient does not appear to be in pain or suffering.

## 2020-05-09 NOTE — Progress Notes (Signed)
Patient ID: [name] [name], female   DOB: Apr 25, 1951, 69 y.o.   MRN: [id number]   This NP reviewed medical records,  assessed the patient in the dialysis room while she was receiving treatment this morning in attempt to assess the patient's capacity to make medical decisions.  The capacity to make ones own decisions is fundamental to the ethical principal of respect for autonomy.    The main determinant of capacity is cognition. Capacity describes a person's ability to make a decision.  Medical capacity refers to the ability to utilize information regarding a  proposed treatment option to make a choice that is congruent with one's own values and preferences.  The are four principle decision-making abilities that constitute capacity are: understanding, expressing a choice, appreciation and reasoning.  Understanding, is to know the meaning of the information given.  Individuals should be able to clearly communicate a choice when presented with multiple treatment options.  Individuals should be able to clearly communicate a choice when presented with multiple options.  Appreciation is reflected in not just knowing the facts  but applying those facts to one's own life is essential to make the decision authentic.  The ability to appreciate facts refers to people's ability to recognize how facts are relevant to themselves, and how it affects them now and could affect them  in the future.    The ability to Reason is the ability to compare options and to infer consequences of choice.  In my opinion this patient does not have the capacity to make medical decisions for herself.  Patient's ability to make her own decisions secondary to cognitive impairment which is likely being impacted by multiple comorbidities.   [name] was unable to tell me in her own words any information regarding her current diagnoses or it's relation to this hospital stay.  She could not reason the "what if's" if she stopped dialysis  or refused transfusion.  She was unable to clearly state a choice at all.  However she did verbalize that she would not want to be put on a breathing machines and she was able to speak to the fact that she would like her sister [name] [name]  to speak for her in the event that she could not speak for herself.  I then spoke to the patient's siblings, [name] [name], [name] [name] and  [name] [name]  via conference call as scheduled for  continued conversation had regarding patient's current medical situation; diagnosis, prognosis, goals of care, end-of-life wishes, transitions of care and anticipatory care needs.  We discussed treatment option decisions, we discussed patient's past history as it relates to openness to offered medical interventions/diagnostics, amputation and compliance with current treatment options i.e. dialysis.  Education offered to family regarding a person capacity to make decisions for themselves.  Education offered regarding topics specific to human mortality and adult failure to thrive and the limitation of medical interventions to prolong quality of life when a body does indeed fail to thrive.  Family verbalize understanding.  Capacity can wax and wane.  Family feel that the patient has recently verbalized a desire "to try"   Plan of Care -DNR/DNI-documented today -offer treatments to treat the treatable at this time, if however patient refuses family will take that as "her decision" and begin to shift to a more comfort approach -PMT will f/u tomorrow and update family   Discussed with family  the importance of continued conversation with each other  and the medical providers regarding  overall plan of care and treatment options,  ensuring decisions are within the context of the patients values and GOCs.  Questions and concerns addressed   Emotional support offered   Discussed with Dr Hollie Salk, Dr Candiss Norse  Total time spent on the unit was 60 minutes  Greater than 50% of the  time was spent in counseling and coordination of care  Wadie Lessen NP  Palliative Medicine Team Team Phone # [phone] Pager [phone]

## 2020-05-09 NOTE — Procedures (Signed)
Patient seen and examined on Hemodialysis. BP 128/64   Pulse 100   Temp 99.7 F (37.6 C) (Oral)   Resp (!) 30   Ht 5\' 7"  (1.702 m)   Wt 113.4 kg   SpO2 (!) 89%   BMI 39.16 kg/m   QB 400 mL/ min via AVG UF goal 3L  Tolerating treatment without complaints at this time.   Madelon Lips MD [facility] pgr [phone] 10:25 AM

## 2020-05-09 NOTE — Progress Notes (Signed)
Inpatient Diabetes Program Recommendations  AACE/ADA: New Consensus Statement on Inpatient Glycemic Control   Target Ranges:  Prepandial:   less than 140 mg/dL      Peak postprandial:   less than 180 mg/dL (1-2 hours)      Critically ill patients:  140 - 180 mg/dL   Results for [name] [name], [name] [name] (MRN [id number]) as of 05/09/2020 12:17  Ref. Range 05/08/2020 07:45 05/08/2020 12:02 05/08/2020 16:44 05/08/2020 20:58 05/09/2020 07:36  Glucose-Capillary Latest Ref Range: 70 - 99 mg/dL 136 (H) 188 (H) 181 (H) 209 (H) 208 (H)  Results for [name], [name] (MRN [id number]) as of 05/09/2020 12:17  Ref. Range 04/03/2020 21:36  Hemoglobin A1C Latest Ref Range: 4.8 - 5.6 % 5.1   Review of Glycemic Control  Diabetes history: DM2 Outpatient Diabetes medications: Humalog 2 units TID with meals Current orders for Inpatient glycemic control: CBG monitoring  Inpatient Diabetes Program Recommendations:   Correction (SSI): Noted glucose has ranged from 136-209 mg/dl over the past 24 hours. May want to consider ordering Novolog 0-9 units TID with meals and Novolog 0-5 units QHS.  Thanks, Barnie Alderman, RN, MSN, CDE Diabetes Coordinator Inpatient Diabetes Program [phone] (Team Pager from 8am to 5pm)

## 2020-05-09 NOTE — TOC Initial Note (Signed)
Transition of Care [facility]) - Initial/Assessment Note    Patient Details  Name: [name] [name] MRN: [id number] Date of Birth: 20-Oct-1951  Transition of Care [facility]) CM/SW Contact:    Benard Halsted, LCSW Phone Number: 05/09/2020, 2:47 PM  Clinical Narrative:                 CSW contacted [facility] admissions to see at what point patient can return since it has been 14 days since patient's first COVID positive test. Admissions will check and let CSW know as it may be difficult to find another COVID SNF with dialysis transportation. Patient will require insurance approval for rehab.    Expected Discharge Plan: [place] Barriers to Discharge: Continued Medical Work up   Patient Goals and CMS Choice Patient states their goals for this hospitalization and ongoing recovery are:: therapy      Expected Discharge Plan and Services Expected Discharge Plan: [place] In-house Referral: Clinical Social Work   Post Acute Care Choice: [place] Living arrangements for the past 2 months: [place], [place]                                      Prior Living Arrangements/Services Living arrangements for the past 2 months: [place], [place] Lives with:: Self Patient language and need for interpreter reviewed:: Yes Do you feel safe going back to the place where you live?: Yes      Need for Family Participation in Patient Care: Yes (Comment) Care giver support system in place?: Yes (comment)   Criminal Activity/Legal Involvement Pertinent to Current Situation/Hospitalization: No - Comment as needed  Activities of Daily Living      Permission Sought/Granted Permission sought to share information with : Facility Sport and exercise psychologist, Family Supports Permission granted to share information with : Yes, Verbal Permission Granted  Share Information with NAME: [name]  Permission granted to  share info w AGENCY: [place] granted to share info w Relationship: Sister  Permission granted to share info w Contact Information: [phone]  Emotional Assessment   Attitude/Demeanor/Rapport: Unable to Assess Affect (typically observed): Unable to Assess Orientation: : Oriented to Self, Oriented to Place Alcohol / Substance Use: Not Applicable Psych Involvement: No (comment)  Admission diagnosis:  Gangrene ([place]) [I96] Hyperkalemia [E87.5] Gangrene of right foot ([place]) [I96] COVID-19 virus infection [U07.1] Patient Active Problem List   Diagnosis Date Noted  . Palliative care by specialist   . DNR (do not resuscitate) discussion   . Occult GI bleeding   . COVID-19 virus infection   . Hematochezia   . Gangrene ([place]) 04/03/2020  . Osteomyelitis ([place]) 04/10/2020  . GI bleed 04/05/2020  . AVM (arteriovenous malformation) of small bowel, acquired   . GAVE (gastric antral vascular ectasia)   . Polyp of hepatic flexure of colon   . Acute post-hemorrhagic anemia   . Dialysis patient ([place])   . Gastroesophageal reflux disease without esophagitis   . AVM (arteriovenous malformation) of colon   . Symptomatic anemia 02/21/2020  . AMS (altered mental status) 01/01/2020  . Recurrent falls 10/09/2019  . Generalized weakness 10/09/2019  . Unspecified atrial fibrillation ([place]) 10/08/2019  . Acute GI bleeding 10/05/2019  . Hyperkalemia   . Depression   . Esophageal stricture   . Diabetic gastroparesis ([place])   . Upper GI bleed 08/16/2019  . Elevated  troponin 04/27/2019  . Hiatal hernia   . Iron deficiency anemia   . Microcytic anemia 02/05/2016  . Acute on chronic renal failure ([place])   . CAD in native artery   . Substance abuse ([place])   . Chronic combined systolic and diastolic CHF (congestive heart failure) ([place])   . ESRD on dialysis ([place])   . Obesity, Class III, BMI 40-49.9 (morbid obesity) ([place])   . Type 2 diabetes mellitus with chronic kidney disease on chronic  dialysis, with long-term current use of insulin ([place]) 10/18/2014  . S/P CABG (coronary artery bypass graft) 09/21/2013  . Arthritis of right shoulder region 03/23/2013  . Tobacco abuse   . Coronary artery disease   . NSTEMI (non-ST elevated myocardial infarction) ([place]) 06/29/2012  . History of cocaine abuse ([place]) 06/29/2012  . Essential hypertension 06/29/2012  . Glaucoma   . Hyperlipidemia   . History of CVA (cerebrovascular accident)    PCP:  [facility] Pharmacy:   [facility] [address] [place] [address] Phone: [phone] Fax: [phone]     Social Determinants of Health (SDOH) Interventions    Readmission Risk Interventions Readmission Risk Prevention Plan 05/09/2020 04/05/2020 12/12/2019  Transportation Screening Complete Complete Complete  Medication Review Press photographer) Complete Complete Complete  PCP or Specialist appointment within 3-5 days of discharge Complete Not Complete Complete  PCP/Specialist Appt Not Complete comments - facility resident -  [place] or Home Care Consult Complete Complete -  SW Recovery Care/Counseling Consult Complete Complete -  Palliative Care Screening Complete Not Applicable -  [place] Complete Complete -  Some recent data might be hidden

## 2020-05-09 NOTE — Significant Event (Signed)
Rapid Response Event Note  Overview: Called originally at 2026 d/t  RED MEWS-5 for RR-33, LOC-verbal, and T-102.2.  Pt also requiring 10L HFNC to maintain SpO2 versus 2L [place] this am. Per RN, pt's LOC has been decreasing since early this morning. It was thought this was d/t pt's need for HD. Pt had HD this am but mental status hasn't improved. Plan to tx fever and continue to monitor. RRT called again at 2233 because SpO2-80s on NRB.  Initial Focused Assessment: Pt laying in bed with eyes closed, grimaces only to deep painful stimuli. Pt with agonal respirations, lungs diminished with some wheezes. Extremities cool to touch but trunk is warm. T-103.6, HR-73(SR), BP-102/59, RR-31, SpO2-86% on NRB.    Interventions: PCXR-B interstitial and alveolar opacities consistent with edema, new R pleural effusion. BC X 2 Procalcitonin Strep pneumoniae/legionella Sputum Vanc/maxipime/zitrhomax   Plan of Care (if not transferred): Pt is DNR. Bipap isn't an option d/t mental status and weak cough. She doesn't look like she is in pain. Continue to monitor. Call RRT if further assistance needed.   2344-SBP-70-80s-250cc NS bolus and albumin ordered.   Event Summary:  Dr. Myna Hidalgo notified by bedside RN.   Called: 2026 and 2233 Arrived: 2238 Ended: 2315  [place]

## 2020-05-09 NOTE — Progress Notes (Signed)
[facility] Progress Note   Assessment/ Plan:   Dialysis:[facility] TTS =COVID shift, will go back to MWF ,4h 59min 400/800 118kg 2/2.5 bath Hep none TDC. Venofer 100 q HD until 4/17 Mircera 225 (last 4/1) Sensipar 30 Calcitriol 2.25  # Osteomyelitis left foot/ ischemic changes right QPR:FFMB by VVS, pt refused revasc/ angio/ amputation on 4/29, was rescheduled for 5/3 but postponed given continued GIB issues, may not be able to tolerate antiplatelet agents.   # Hx PAD/ recent L 1st toe amp - w/ non-healing wound, per VVS plan is for debridement when pt will consent for procedure. and once GI issues resolved  #Acute blood loss anemia due to LGIB: Prior history AVM's, seen by gastroenterology withcolonoscopy done 5/4 w/ blood throughout the colon, unclear source. Tagged scan 5/8 negative. Pt has refused prep, palliative care now involved. Got 2 u pRBCs yesterday.       # ESRD TTS: On THS while here. No issue with access. Bumped yesterday, will do HD today and tomorrow to get back on schedule.  Increased goal for today, midodrine given too.  # Secondary hyperparathyroidism: P boerline low reduce dose of Velphoro.  Continue calcitriol.  Calcium level low and sensipar dc'd  # HTN/volume: Blood pressure soft today with low Hgb.  On metoprolol--> hold for soft pressure.  #Dispo: pall care meeting today.  Will attempt to call sister to discuss noncompliance from renal perspective before the meeting.  Subjective:    Bumped from HD yesterday d/t emergencies.  On 6L O2 this am, increased WOB esp s/p 2 u pRBCs.  HD this AM, on machine now.  WOB better.       Objective:   BP 128/64   Pulse 100   Temp 99.7 F (37.6 C) (Oral)   Resp (!) 30   Ht 5\' 7"  (1.702 m)   Wt 113.4 kg   SpO2 (!) 89%   BMI 39.16 kg/m   Physical Exam:   Gen: sitting in bed, awake and alert CVS: RRR no m/r/g Resp: wearing O2 increased WOB Abd: soft Ext: R foot/ leg  dressed  Labs: BMET Recent Labs  Lab 05/26/2020 0256 05/04/20 0630 05/04/2020 0840 05/06/20 0632 04/30/2020 0320 05/08/20 0401 05/09/20 0501  NA 137 139 139 139 141 139 138  K 3.8 4.0 3.8 3.6 3.7 4.1 5.7*  CL 101 100 102 100 102 101 99  CO2 25 25 22 23 23 23  21*  GLUCOSE 93 125* 90 106* 93 114* 182*  BUN 19 8 18 13  25* 38* 47*  CREATININE 6.48* 4.10* 5.86* 3.94* 5.68* 7.49* 8.97*  CALCIUM 7.2* 7.4* 7.6* 7.7* 7.5* 7.3* 7.4*  PHOS 3.4 2.4* 3.8 2.6 3.9 5.3* 7.3*   CBC Recent Labs  Lab 05/06/20 0632 05/06/20 0632 05/19/2020 0320 05/08/20 0401 05/08/20 1829 05/09/20 0501  WBC 7.6  --  7.3 6.6  --  12.6*  NEUTROABS  --   --  5.6 5.5  --  11.5*  HGB 8.0*   < > 7.5* 6.6* 10.8* 11.1*  HCT 25.4*   < > 24.5* 21.8* 33.5* 34.9*  MCV 88.8  --  91.8 89.3  --  88.4  PLT 242  --  266 246  --  316   < > = values in this interval not displayed.      Medications:    . allopurinol  100 mg Oral Daily  . atorvastatin  40 mg Oral q1800  . buPROPion  150 mg Oral BID  .  calcitRIOL  2.25 mcg Oral Q T,Th,Sat-1800  . Chlorhexidine Gluconate Cloth  6 each Topical Q0600  . darbepoetin (ARANESP) injection - DIALYSIS  150 mcg Intravenous Q Sat-HD  . feeding supplement  1 Container Oral TID BM  . gabapentin  300 mg Oral QHS  . linaclotide  72 mcg Oral QAC breakfast  . loratadine  10 mg Oral Daily  . metoprolol tartrate  25 mg Oral BID  . pantoprazole  40 mg Oral Daily  . polyethylene glycol  17 g Oral BID  . sucroferric oxyhydroxide  500 mg Oral TID WC     Madelon Lips, MD [facility] pgr [phone] 05/09/2020, 10:21 AM

## 2020-05-09 NOTE — Progress Notes (Addendum)
Patient was very lethargic and had labored breathing at the beginning of shift.  Vital signs obtained and patient had a new temperature.  Patient's temp was taken axillary and refused oral/rectal.  Patient was on 6L via [place] upon start of shift and would drop to 60s-70s when she removed the .  See chart for vitals and assessment.  See chart for interventions as well.    New orders from provider.  See chart.  Tylenol administered for fever.  Repositioned patient in bed as she was at the bottom of the bed.  Switched patient a HFNC and had to increase patient to 10L to maintain oxygen needs.    Patient was very lethargic and getting Tylenol down was difficult due to lack of arousal.  Unable to give HS meds due to risk of aspiration and patient's refusal.   Applied soft mittens so patient would not remove O2 nasal cannula and oxygen monitoring probe.    CBG checked and was 61 then 55.  Hypoglycemic protocol initiated.  D50 IVP given.  CBG rechecked 15 minutes after treatment administered and BS was 175.  Will recheck 1 hr after 175 reading.  0055-  CBG rechecked after 1 hr.  See chart.  Patient continued to decline.  O2 requirements increased and NRB mask was added at 15L plus 6L HFNC to keep sats >88%.  O2 sats were unable to be obtained at the end even after multiple probe site changes.    Dr. Myna Hidalgo and Rapid Response nurse, Mindy, RN assessed patient.  Dr. Myna Hidalgo contacted patient's family and updated them on patient's decline.    Patient's respirations declined and tele showed asystole. No heart sounds present.  Dr. Myna Hidalgo contacted and pronounced patient deceased at 24.  Tele monitor tech to print out cardiac strip.    Will call family and update them on patient's passing.     0113- Updated patient's sister on patient passing.  She stated she was going to talk to the other siblings on a funeral home and would call back in the morning with their plans.  Sister expressed appreciation of the care of  her sister and the phone call.

## 2020-05-09 NOTE — Progress Notes (Signed)
Nutrition Follow-up  DOCUMENTATION CODES:   Obesity unspecified  INTERVENTION:  Continue Boost Breeze po TID, each supplement provides 250 kcal and 9 grams of protein  Encourage adequate PO intake.   NUTRITION DIAGNOSIS:   Increased nutrient needs related to chronic illness(ESRD on HD, wound healing) as evidenced by estimated needs; ongoing  GOAL:   Patient will meet greater than or equal to 90% of their needs; progressing  MONITOR:   PO intake, Supplement acceptance, Skin, Weight trends, Labs, I & O's, Diet advancement  REASON FOR ASSESSMENT:   NPO/Clear Liquid Diet    ASSESSMENT:   69 y.o. female with PMHx of ESRD on HD, PAD-s/p left great toe amputation on 04/11/2020, CAD s/p CABG, DM-2, HTN-who was transferred from her SNF for evaluation of acute metabolic encephalopathy in the setting of gangrenous changes to the right foot. Pt found to have COVID-19. Angiogram postponed due to ongoing GI bleed.   RD working remotely.  Per MD, pt refusing to undergo bowel prep for EGD for causation of ongoing GI bleed. Pt currently on a clear liquid diet. Pt currently has Boost Breeze ordered and has been consuming most of them. RD to continue with current orders. Labs and medications reviewed. Potassium elevated at 5.7. Phosphorous elevated at 7.3.  Diet Order:   Diet Order            Diet clear liquid Room service appropriate? Yes; Fluid consistency: Thin  Diet effective now              EDUCATION NEEDS:   Not appropriate for education at this time  Skin:  Skin Assessment: Skin Integrity Issues: Skin Integrity Issues:: Incisions, Other (Comment), Unstageable Unstageable: R ankle Incisions: L foot Other: non pressure wound buttocks  Last BM:  5/11  Height:   Ht Readings from Last 1 Encounters:  05/02/2020 5\' 7"  (1.702 m)    Weight:   Wt Readings from Last 1 Encounters:  05/09/20 110 kg    BMI:  Body mass index is 37.98 kg/m.  Estimated Nutritional Needs:    Kcal:  2100-2300  Protein:  110-125 grams  Fluid:  1 L + UOP  Corrin Parker, MS, RD, LDN RD pager number/after hours weekend pager number on Amion.

## 2020-05-09 NOTE — Progress Notes (Signed)
PT Cancellation Note  Patient Details Name: [name] [name] MRN: [id number] DOB: 01-19-1951   Cancelled Treatment:    Reason Eval/Treat Not Completed: Patient at procedure or test/unavailable.  Pt at HD.  PT to check back today or tomorrow as time and schedule allows.   Thanks,  Verdene Lennert, PT, DPT  Acute Rehabilitation [phone] pager #[phone] office       [name] Guiles B Sherrise Liberto 05/09/2020, 12:55 PM

## 2020-05-09 NOTE — Progress Notes (Signed)
Pharmacy Antibiotic Note  [name] [name] is a 69 y.o. female admitted on 04/03/2020 with pneumonia.  Pharmacy has been consulted for cefepime and vancomycin dosing.  Plan: Cefepime 2gm IV X 1 then 1gm IV daily Vancomycin 2gm IV x 1 then 1gm after each HD F/u cultures and clinical course  Height: 5\' 7"  (170.2 cm) Weight: 110 kg (242 lb 8.1 oz) IBW/kg (Calculated) : 61.6  Temp (24hrs), Avg:100.2 F (37.9 C), Min:98.5 F (36.9 C), Max:102.8 F (39.3 C)  Recent Labs  Lab 05/27/2020 0840 05/06/20 0632 05/28/2020 0320 05/08/20 0401 05/09/20 0501  WBC 7.0 7.6 7.3 6.6 12.6*  CREATININE 5.86* 3.94* 5.68* 7.49* 8.97*    Estimated Creatinine Clearance: 7.6 mL/min (A) (by C-G formula based on SCr of 8.97 mg/dL (H)).    Allergies  Allergen Reactions  . Naproxen Rash    Thank you for allowing pharmacy to be a part of this patient's care.  Excell Seltzer [place] 05/09/2020 10:34 PM

## 2020-05-09 NOTE — Progress Notes (Signed)
PROGRESS NOTE                                                                                                                                                                                                             Patient Demographics:    [name] [name], is a 69 y.o. female, DOB - 16-Nov-1951, KGY:[id number]  Outpatient Primary MD for the patient is [facility] date - 04/06/2020   LOS - 11  Chief Complaint  Patient presents with   Altered Mental Status       Brief Narrative: Patient is a 69 y.o. 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  She was also incidentally found to have COVID-19.  Hospital course complicated by recurrent GI bleeding of obscure etiology-see below.  Significant Events: 4/6-4/22>> admit to [facility] for upper GI bleeding and blood loss anemia, left great toe osteo s/p amputation 4/28>> admit to [facility] for acute metabolic encephalopathy 0/26>> refused to sign consent for angiogram/amputation-rescheduled for 5/3 4/30>> bloody stools  5/2>> colonoscopy postponed-incomplete prep-due to refusal to complete bowel prep 5/3>> colonoscopy postponed-incomplete prep-due to refusal to complete bowel prep, lower extremity angiogram postponed-due to GI bleeding. 5/4>> colonoscopy- residual blood in the entire colon 5/6>> capsule endoscopy-unfortunately capsule retained in the stomach 5/8>> EGD with capsule placement in small bowel postponed as patient refused to drink prep on 5/7 pm 5/8>> tagged RBC scan negative  Procedures/studies: 5/7>> repeat CT mesenteric angiogram: No active bleeding source 5/6>>capsule  endoscopy>> capsule retained in the stomach 5/4>> colonoscopy-residual blood throughout: 5/4>> CT mesenteric angiogram: No active bleeding source 4/14>> left great toe amputation 4/10>> EGD/small bowel enteroscopy>> AVM third portion of duodenum  COVID-19 medications: Remdesivir: 4/28>> 4/29  Antibiotics: Vancomycin: 4/28>>5/1 Zosyn: 4/28>>5/7  Microbiology data: 4/28: Blood culture>> no growth 4/28: Wound swab/superficial culture>> Pseudomonas  DVT prophylaxis: Hold Heparin 4/30 2/2 GI bleed - SCDs  Consults: VVS, renal,GI, Pall Care    Subjective:   Patient in bed, appears short of breath, no chest or abdominal pain.    Assessment  & Plan :  Acute metabolic encephalopathy: Likely secondary to SIRS/right foot gangrene-uremia due to missed HD.  Significantly improved after treatment of SIRS/foot infection with gangrene and dialysis-now back to baseline.  Sepsis with right lower extremity critical limb ischemia: Afebrile- sepsis physiology has resolved-has gangrenous changes evident on exam-please see pictures taken on 4/28 and prior notes.  Blood cultures negative so far.    Superficial swab on admission positive for Pseudomonas-not sure how accurate this is.  Has completed a 10-day course of Zosyn with plans to watch off antibiotics.   Refused angiogram/amputation on 4/29-was rescheduled for 5/3-however with her ongoing GI bleeding-angiogram postponed for now. Very difficult situation-this patient has known history of AVMs and history of recurrent  GI bleeding requiring PRBC transfusion-and may not be able to tolerate long-term dual antiplatelet therapy.    Once GI work-up has been completed-we will need to notify vascular surgery (Dr. Servando Snare) - for angiogram/amputation needs.  History of PAD with recent left great toe amputation-now with nonhealing left foot wound: Will need debridement with vascular surgery once her GI bleeding has resolved.  No longer on aspirin due  to GI bleeding-remains on statin.   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  Colonoscopy revealed changes suggestive of possible small bowel , negative tagged RBC scan on 05/08/2020.  First capsule endoscopy failed as the capsule got stuck in the stomach, plan to do EGD to retrieve old capsule and to place a new capsule in the duodenum however patient has been noncompliant with bowel prep and swallowing and repeat EGD has been rescheduled 3 times, finally we tried for the fourth time again on 05/06/2020 she had initially agreed to do the bowel prep but again at night refused it, EGD again canceled on 05/09/2020 this is the 3rd or 4th time that we have canceled her endoscopy procedure this admission.  Continue to monitor packed RBC.  GI on board.  Continue PPI for now.  Hold antiplatelets.  I explained and warned the patient that noncompliance with medical procedure can result in ongoing bleed with eventual stroke disability or death.  Kindly see capacity evaluation below, palliative care is having a family meeting on 05/09/2020 as well, supportive care till then.   Capacity Evaluation  -  Patient does  appear to have the capacity to make an informed decision and to refuse diagnostic tests and treatment.    Patient is able to express a choice of not undergoing bowel prep - EGD, clearly understand the risk and its affect of causing ongoing GI bleed and that it could result in disability and death.  Nursing staff present in the room when I had this detailed discussion again on 05/08/2020.       Anemia: Combination of anemia of chronic disease along with acute GI blood loss related anemia.  See above  Hyperkalemia: Secondary to missed HD-resolved with HD.  Follow  periodically.  ESRD: Nephrology following and directing care. TTS - HD.  Acute hypoxic respiratory failure due to fluid overload from blood transfusion and missed dialysis on 05/08/2020, discussed with nephrology urgent dialysis with 3 L of fluid removal on 05/09/2020.  Monitor with oxygen supplementation.    HTN: BP relatively well controlled-continue metoprolol  Paroxysmal atrial fibrillation/atrial flutter: Not a new diagnosis-Per discharge summary in October 2020-she also had  atrial fibrillation  as well.  Given recurrent GI bleeding/anemia-not a good candidate for anticoagulation.  Continue rate control with metoprolol.  Chronic combined diastolic and systolic heart failure (EF 45-50% by TTE on 04/28/2019): Volume status stable-diuresis with HD.  History of CVA: Continue statin-see above regarding aspirin and anticoagulation  Gout: Not in flare-continue allopurinol  Covid 19 Viral infection: Incidental, no symptoms or pneumonia on x-ray, monitor.  O2 requirements:  SpO2: (!) 89 % O2 Flow Rate (L/min): 6 L/min   COVID-19 Labs: No results for input(s): DDIMER, FERRITIN, LDH, CRP in the last 72 hours.     Component Value Date/Time   BNP 897.8 (H) 01/01/2020 0600    No results for input(s): PROCALCITON in the last 168 hours.  Lab Results  Component Value Date   SARSCOV2NAA POSITIVE (A) 04/05/2020   [place] NEGATIVE 04/16/2020   [place] NEGATIVE 04/03/2020   [place] NEGATIVE 03/15/2020     DM-2 with hypoglycemia (A1C 5.1 on 04/03/2020): No further hypoglycemic episodes-continue to hold all insulin for now.  Not a good candidate for tight glycemic control-allow some amount of permissive hyperglycemia.  CBG (last 3)  Recent Labs    05/08/20 1644 05/08/20 2058 05/09/20 0736  GLUCAP 181* 209* 208*    Morbid Obesity: Estimated body mass index is 39.16 kg/m as calculated from the following:   Height as of this encounter: 5\' 7"  (1.702 m).   Weight as of this  encounter: 113.4 kg.  Pressure Injury 05/18/2020 Ankle Posterior;Right Unstageable - Full thickness tissue loss in which the base of the injury is covered by slough (yellow, tan, gray, green or brown) and/or eschar (tan, brown or black) in the wound bed. black/brown wound bed o (Active)  05/13/2020 1630  Location: Ankle  Location Orientation: Posterior;Right  Staging: Unstageable - Full thickness tissue loss in which the base of the injury is covered by slough (yellow, tan, gray, green or brown) and/or eschar (tan, brown or black) in the wound bed.  Wound Description (Comments): black/brown wound bed on heel  Present on Admission:     Condition - Guarded  Family Communication  : Previous MD discussed with patient's aunt over the phone 5/8-have asked her to talk to the patient and encourage patient to complete her bowel prep this evening.   Sister [name] [phone] in detail on 05/07/2020.  Code Status :  Full Code  Diet :  Diet Order            Diet clear liquid Room service appropriate? Yes; Fluid consistency: Thin  Diet effective now               Disposition Plan  :  Status is: Inpatient  Remains inpatient appropriate because:Inpatient level of care appropriate due to severity of illness  Dispo: The patient is from: SNF              Anticipated d/c is to: SNF              Anticipated d/c date is: > 3 days              Patient currently is not medically stable to d/c.  Barriers to discharge: Ongoing GI bleeding without obvious source-right foot gangrene on IV antibiotics-probably will require revascularization/amputation once GI bleeding is controlled.  Antimicorbials  :    Anti-infectives (From admission, onward)   Start     Dose/Rate Route Frequency Ordered Stop   04/28/20 1200  vancomycin (VANCOCIN) IVPB 1000 mg/200 mL premix  Status:  Discontinued     1,000 mg 200 mL/hr over 60 Minutes Intravenous Every T-Th-Sa (Hemodialysis) 04/27/20 1154 04/28/20 1218   04/26/20  1400  piperacillin-tazobactam (ZOSYN) IVPB 2.25 g  Status:  Discontinued     2.25 g 100 mL/hr over 30 Minutes Intravenous Every 8 hours 04/26/20 1314 05/04/20 1524   04/26/20 1000  remdesivir 100 mg in sodium chloride 0.9 % 100 mL IVPB  Status:  Discontinued     100 mg 200 mL/hr over 30 Minutes Intravenous Daily 04/24/2020 1952 04/26/20 1105   04/24/2020 2030  remdesivir 200 mg in sodium chloride 0.9% 250 mL IVPB     200 mg 580 mL/hr over 30 Minutes Intravenous Once 04/04/2020 1952 03/29/2020 2327   04/26/2020 1515  vancomycin (VANCOCIN) 2,500 mg in sodium chloride 0.9 % 500 mL IVPB     2,500 mg 250 mL/hr over 120 Minutes Intravenous  Once 04/10/2020 1513 04/24/2020 2026   04/11/2020 1513  vancomycin variable dose per unstable renal function (pharmacist dosing)  Status:  Discontinued      Does not apply See admin instructions 04/14/2020 1514 04/28/20 1218   04/22/2020 1500  piperacillin-tazobactam (ZOSYN) IVPB 3.375 g     3.375 g 100 mL/hr over 30 Minutes Intravenous  Once 04/11/2020 1450 04/17/2020 2026      Inpatient Medications  Scheduled Meds:  allopurinol  100 mg Oral Daily   atorvastatin  40 mg Oral q1800   buPROPion  150 mg Oral BID   calcitRIOL  2.25 mcg Oral Q T,Th,Sat-1800   Chlorhexidine Gluconate Cloth  6 each Topical Q0600   darbepoetin (ARANESP) injection - DIALYSIS  150 mcg Intravenous Q Sat-HD   feeding supplement  1 Container Oral TID BM   gabapentin  300 mg Oral QHS   linaclotide  72 mcg Oral QAC breakfast   loratadine  10 mg Oral Daily   metoprolol tartrate  25 mg Oral BID   pantoprazole  40 mg Oral Daily   polyethylene glycol  17 g Oral BID   sucroferric oxyhydroxide  500 mg Oral TID WC   Continuous Infusions:  sodium chloride     PRN Meds:.sodium chloride, acetaminophen, albuterol, alteplase, bisacodyl, Gerhardt's butt cream, heparin, lidocaine (PF), lidocaine-prilocaine, metoprolol tartrate, mometasone-formoterol, nitroGLYCERIN, oxyCODONE,  pentafluoroprop-tetrafluoroeth, sodium chloride flush   Time Spent in minutes 25   See all Orders from today for further details   Lala Lund M.D on 05/09/2020 at 10:56 AM  To page go to [email or web address] - use universal password  [facility] -  Office  [phone]    Objective:   Vitals:   05/09/20 0900 05/09/20 0930 05/09/20 1000 05/09/20 1030  BP: (!) 145/85 134/74 128/64 (!) 165/72  Pulse: 96 99 100 (!) 102  Resp:      Temp:      TempSrc:      SpO2:      Weight:      Height:        Wt Readings from Last 3 Encounters:  05/09/20 113.4 kg  04/19/20 118.4 kg  03/02/20 120.2 kg     Intake/Output Summary (Last 24 hours) at 05/09/2020 1056 Last data filed at 05/09/2020 0513 Gross per 24 hour  Intake 2070 ml  Output --  Net 2070 ml    Physical Exam  Awake , No new F.N deficits, Normal affect [place].AT,PERRAL Supple Neck,No JVD, No cervical lymphadenopathy appriciated.  Symmetrical Chest wall movement , + rales RRR,No Gallops, Rubs or new Murmurs, No Parasternal Heave +ve B.Sounds, Abd Soft,  No tenderness, No organomegaly appriciated, No rebound - guarding or rigidity. Both feet wrapped under bandage    Data Review:    CBC Recent Labs  Lab 05/26/2020 0840 05/02/2020 0840 05/06/20 9390 05/04/2020 0320 05/08/20 0401 05/08/20 1829 05/09/20 0501  WBC 7.0  --  7.6 7.3 6.6  --  12.6*  HGB 7.3*   < > 8.0* 7.5* 6.6* 10.8* 11.1*  HCT 24.0*   < > 25.4* 24.5* 21.8* 33.5* 34.9*  PLT 295  --  242 266 246  --  316  MCV 91.6  --  88.8 91.8 89.3  --  88.4  MCH 27.9  --  28.0 28.1 27.0  --  28.1  MCHC 30.4  --  31.5 30.6 30.3  --  31.8  RDW 19.4*  --  18.5* 18.9* 18.7*  --  17.6*  LYMPHSABS  --   --   --  1.0 0.7  --  0.6*  MONOABS  --   --   --  0.6 0.4  --  0.4  EOSABS  --   --   --  0.0 0.0  --  0.0  BASOSABS  --   --   --  0.0 0.0  --  0.1   < > = values in this interval not displayed.    Chemistries  Recent Labs  Lab 04/30/2020 0840 05/06/20 0632  05/04/2020 0320 05/08/20 0401 05/09/20 0501  NA 139 139 141 139 138  K 3.8 3.6 3.7 4.1 5.7*  CL 102 100 102 101 99  CO2 22 23 23 23  21*  GLUCOSE 90 106* 93 114* 182*  BUN 18 13 25* 38* 47*  CREATININE 5.86* 3.94* 5.68* 7.49* 8.97*  CALCIUM 7.6* 7.7* 7.5* 7.3* 7.4*   ------------------------------------------------------------------------------------------------------------------ No results for input(s): CHOL, HDL, LDLCALC, TRIG, CHOLHDL, LDLDIRECT in the last 72 hours.  Lab Results  Component Value Date   HGBA1C 5.1 04/03/2020   ------------------------------------------------------------------------------------------------------------------ No results for input(s): TSH, T4TOTAL, T3FREE, THYROIDAB in the last 72 hours.  Invalid input(s): FREET3 ------------------------------------------------------------------------------------------------------------------ No results for input(s): VITAMINB12, FOLATE, FERRITIN, TIBC, IRON, RETICCTPCT in the last 72 hours.  Coagulation profile No results for input(s): INR, PROTIME in the last 168 hours.  No results for input(s): DDIMER in the last 72 hours.  Cardiac Enzymes No results for input(s): CKMB, TROPONINI, MYOGLOBIN in the last 168 hours.  Invalid input(s): CK ------------------------------------------------------------------------------------------------------------------    Component Value Date/Time   BNP 897.8 (H) 01/01/2020 0600    Micro Results No results found for this or any previous visit (from the past 240 hour(s)).  Radiology Reports DG Chest 2 View  Result Date: 04/14/2020 CLINICAL DATA:  69 year old female with suspected sepsis. Bilateral foot infection for the past 3 weeks. EXAM: CHEST - 2 VIEW COMPARISON:  Chest x-ray 04/04/2020. FINDINGS: Lung volumes are normal. No consolidative airspace disease. No pleural effusions. No evidence of frank pulmonary edema. Mild cephalization of the pulmonary vasculature. Mild  cardiomegaly. Upper mediastinal contours are within normal limits. Aortic atherosclerosis. Status post median sternotomy for CABG. Right internal jugular PermCath with tip terminating in the mid to distal superior vena cava. Aortic atherosclerosis. IMPRESSION: 1. Postoperative changes and support apparatus, as above. 2. Mild cardiomegaly with pulmonary venous congestion, without frank pulmonary edema. 3. Aortic atherosclerosis. Electronically Signed   By: Vinnie Langton M.D.   On: 04/11/2020 14:52   DG Abd 1 View  Result Date: 05/04/2020 CLINICAL DATA:  Possible retained small-bowel capsule EXAM: ABDOMEN - 1 VIEW COMPARISON:  04/10/2020, CT 05/06/2020 FINDINGS: Nonobstructed gas pattern. Surgical clips in the right lower quadrant. Calcified pelvic phleboliths. Barrel-shaped metallic opacity measuring 17 mm projecting over expected location of distal esophagus/GE junction. IMPRESSION: 1. 17 mm metallic barrel-shaped opacity projects over the expected location of distal esophagus or GE junction. No additional metallic radiopaque foreign bodies are visible over the included abdomen or pelvis. Electronically Signed   By: Donavan Foil M.D.   On: 05/04/2020 17:39   NM GI Blood Loss  Result Date: 05/15/2020 CLINICAL DATA:  Hemoglobin with large bloody stools. EXAM: NUCLEAR MEDICINE GASTROINTESTINAL BLEEDING SCAN TECHNIQUE: Sequential abdominal images were obtained following intravenous administration of Tc-57m labeled red blood cells. RADIOPHARMACEUTICALS:  26.2 mCi Tc-75m pertechnetate in-vitro labeled red cells. COMPARISON:  None. FINDINGS: It should be noted that the study is limited secondary to an extensive amount of patient motion. Normal accumulation of tracer is seen within the vascular system. No areas of abnormal tracer uptake are seen within the large and small bowel. Tracer activity is seen extending across the right abdomen which represents contaminated tracer uptake within the patient's right hand.  IMPRESSION: 1. Limited study, as described above, without evidence of active gastrointestinal bleeding at the time of the exam. Electronically Signed   By: Virgina Norfolk M.D.   On: 04/29/2020 23:36   CT FOOT RIGHT WO CONTRAST  Result Date: 04/12/2020 CLINICAL DATA:  Pain and swelling. EXAM: CT OF THE RIGHT FOOT WITHOUT CONTRAST TECHNIQUE: Multidetector CT imaging of the right foot was performed according to the standard protocol. Multiplanar CT image reconstructions were also generated. COMPARISON:  Radiographs 04/09/2020 FINDINGS: Diffuse subcutaneous soft tissue swelling/edema/fluid involving the entire ankle and foot. No focal fluid collection to suggest a drainable soft tissue abscess. Marked fatty atrophy of the foot and ankle musculature. Suspect myofasciitis involving the plantar foot musculature but no definite findings for pyomyositis. Suspect an open wound along the plantar aspect of the great toe but I do not see any obvious destructive bony changes to suggest osteomyelitis. No definite findings for septic arthritis. The other bony structures of the foot and ankle appear intact. No obvious destructive bony changes, fractures or osteochondral lesions. Extensive small artery calcifications. IMPRESSION: 1. Diffuse subcutaneous soft tissue swelling/edema/fluid involving the entire ankle and foot consistent with cellulitis. No focal fluid collection to suggest a drainable soft tissue abscess. 2. Suspect myofasciitis involving the plantar foot musculature but no definite findings for pyomyositis. Severe diffuse fatty atrophy of the foot and ankle musculature. 3. Suspect an open wound along the plantar aspect of the great toe but I do not see any obvious destructive bony changes to suggest osteomyelitis. No definite findings for septic arthritis. 4. If symptoms persist or worsen MRI is suggested for further evaluation. Electronically Signed   By: Marijo Sanes M.D.   On: 04/12/2020 10:51   DG Abd  Portable 1V  Result Date: 05/06/2020 CLINICAL DATA:  Altered mental status. COVID-19 positive. Apparent recurrent upper gastrointestinal bleeding EXAM: PORTABLE ABDOMEN - 1 VIEW COMPARISON:  May 04, 2020. FINDINGS: Previously noted radiopaque foreign body is now in or overlying the distal small bowel in the right upper pelvic region. There is no bowel dilatation or air-fluid level to suggest bowel obstruction. No free air. There are apparent phleboliths in the pelvis. IMPRESSION: Metallic foreign body in or overlying the distal small bowel region. No bowel obstruction or free air. Electronically Signed   By: Lowella Grip III M.D.   On: 05/06/2020 11:13   DG Foot Complete  Left  Result Date: 04/01/2020 CLINICAL DATA:  Recent amputation. Suspect sepsis. Bilateral foot infection 3 weeks. EXAM: LEFT FOOT - COMPLETE 3+ VIEW COMPARISON:  04/09/2020 FINDINGS: There is generalized decreased bone mineralization. Evidence of patient's recent amputation distal to the distal shaft of the first metatarsal. Expected postsurgical changes at the amputation site. No focal bone destruction to suggest osteomyelitis. No air within the soft tissues. Exam is otherwise unchanged. IMPRESSION: Expected changes post amputation distal to the distal aspect of the first metatarsal. No definite bone destruction to suggest osteomyelitis. No air within the soft tissues. Electronically Signed   By: Marin Olp M.D.   On: 04/06/2020 14:58   DG Foot Complete Left  Result Date: 04/09/2020 CLINICAL DATA:  Foot pain and swelling EXAM: LEFT FOOT - COMPLETE 3+ VIEW COMPARISON:  02/01/2020 MRI, 01/31/2020 plain film FINDINGS: There is been interval amputation of the first toe. Persistent soft tissue wound is seen. There is lucency identified in the head of the first metatarsal suspicious for recurrent osteomyelitis. No other fracture or dislocation is seen. No other soft tissue abnormality is noted. IMPRESSION: Changes suspicious for  osteomyelitis in the head of the first metatarsal. Overlying soft tissue wound is noted. Electronically Signed   By: Inez Catalina M.D.   On: 04/09/2020 19:40   DG Foot Complete Right  Result Date: 04/26/2020 CLINICAL DATA:  Recent amputation with possible infection three weeks. Suspect sepsis. EXAM: RIGHT FOOT COMPLETE - 3+ VIEW COMPARISON:  CT 04/12/2020 FINDINGS: Diffuse decreased bone mineralization. Degenerative change over the first MTP joint and interphalangeal joints. Mild horizontal sclerosis with subtle lucency involving the head of the fifth metatarsal possibly subacute fracture. No focal bone destruction to suggest osteomyelitis. No air within the soft tissues. IMPRESSION: 1. No definite evidence of osteomyelitis and no air within the soft tissues. 2. Linear sclerosis with subtle lucency involving the head of the fifth metatarsal which may be due to nondisplaced subacute fracture. Electronically Signed   By: Marin Olp M.D.   On: 04/16/2020 14:56   VAS [place] LOWER EXTREMITY VENOUS (DVT)  Result Date: 04/10/2020  Lower Venous DVTStudy Indications: Edema, and fever.  Limitations: Body habitus and poor ultrasound/tissue interface. Comparison Study: 04/28/19 previous Performing Technologist: Abram Sander RVS  Examination Guidelines: A complete evaluation includes B-mode imaging, spectral Doppler, color Doppler, and power Doppler as needed of all accessible portions of each vessel. Bilateral testing is considered an integral part of a complete examination. Limited examinations for reoccurring indications may be performed as noted. The reflux portion of the exam is performed with the patient in reverse Trendelenburg.  +---------+---------------+---------+-----------+----------+--------------+  RIGHT     Compressibility Phasicity Spontaneity Properties Thrombus Aging  +---------+---------------+---------+-----------+----------+--------------+  CFV       Full            Yes       Yes                                     +---------+---------------+---------+-----------+----------+--------------+  SFJ       Full                                                             +---------+---------------+---------+-----------+----------+--------------+  FV Prox  Full                                                             +---------+---------------+---------+-----------+----------+--------------+  FV Mid    Full                                                             +---------+---------------+---------+-----------+----------+--------------+  FV Distal Full                                                             +---------+---------------+---------+-----------+----------+--------------+  PFV       Full                                                             +---------+---------------+---------+-----------+----------+--------------+  POP       Full            Yes       Yes                                    +---------+---------------+---------+-----------+----------+--------------+  PTV       Full                                                             +---------+---------------+---------+-----------+----------+--------------+  PERO                                                       Not visualized  +---------+---------------+---------+-----------+----------+--------------+   +---------+---------------+---------+-----------+----------+--------------+  LEFT      Compressibility Phasicity Spontaneity Properties Thrombus Aging  +---------+---------------+---------+-----------+----------+--------------+  CFV       Full            Yes       Yes                                    +---------+---------------+---------+-----------+----------+--------------+  SFJ       Full                                                             +---------+---------------+---------+-----------+----------+--------------+  FV Prox   Full                                                              +---------+---------------+---------+-----------+----------+--------------+  FV Mid    Full                                                             +---------+---------------+---------+-----------+----------+--------------+  FV Distal Full                                                             +---------+---------------+---------+-----------+----------+--------------+  PFV       Full                                                             +---------+---------------+---------+-----------+----------+--------------+  POP       Full            Yes       Yes                                    +---------+---------------+---------+-----------+----------+--------------+  PTV       Full                                                             +---------+---------------+---------+-----------+----------+--------------+  PERO                                                       Not visualized  +---------+---------------+---------+-----------+----------+--------------+     Summary: BILATERAL: - No evidence of deep vein thrombosis seen in the lower extremities, bilaterally.   *See table(s) above for measurements and observations. Electronically signed by Deitra Mayo MD on 04/10/2020 at 3:24:49 PM.    Final    CT Angio Abd/Pel w/ and/or w/o  Result Date: 05/04/2020 CLINICAL DATA:  GI bleed. Persistent bleeding. EGD and colonoscopy were negative. EXAM: CTA ABDOMEN AND PELVIS WITHOUT AND WITH CONTRAST TECHNIQUE: Multidetector CT imaging of the abdomen and pelvis was performed using the standard protocol during bolus administration of intravenous contrast. Multiplanar reconstructed images and MIPs were obtained and reviewed to evaluate the vascular anatomy. CONTRAST:  12mL OMNIPAQUE IOHEXOL 350 MG/ML SOLN COMPARISON:  CT dated May 01, 2020 FINDINGS: VASCULAR Aorta: Again noted are atherosclerotic changes of  the abdominal aorta with an infrarenal abdominal aortic aneurysm measuring approximately 3-3.2 cm.  This is stable in appearance from prior study. Celiac: Atherosclerotic changes are noted of the celiac axis likely resulting in mild stenosis. SMA: Heavy calcifications are noted throughout the SMA likely resulting in tandem mild-to-moderate areas of stenosis. Renals: There are heavy calcifications at the origin of both renal arteries likely resulting in some degree of stenosis. IMA: The IMA is patent. Inflow: There is ectasia of the bilateral common iliac arteries as before. Proximal Outflow: Heavy atherosclerotic changes are noted. Veins: No obvious venous abnormality within the limitations of this arterial phase study. Review of the MIP images confirms the above findings. NON-VASCULAR Lower chest: There is atelectasis at the lung bases.The heart is enlarged. There is reflux of contrast into the IVC. Hepatobiliary: The liver is normal. There appears to be gallbladder sludge or small stones without CT evidence for cholecystitis.There is no biliary ductal dilation. Pancreas: Normal contours without ductal dilatation. No peripancreatic fluid collection. Spleen: There is a small cystic structure in the spleen, of doubtful clinical significance. Adrenals/Urinary Tract: --Adrenal glands: Unremarkable. --Right kidney/ureter: The right kidney is atrophic without evidence for hydronephrosis. There may be a nonobstructing stone in the lower pole. --Left kidney/ureter: The left kidney is atrophic without evidence for hydronephrosis. --Urinary bladder: There is a moderate amount of gas within the urinary bladder which may related to prior instrumentation. Stomach/Bowel: --Stomach/Duodenum: There is a small to moderate-sized hiatal hernia. The patient's reported small-bowel capsule is likely lodged proximal to the GE junction within the patient's hernia. --Small bowel: Unremarkable. --Colon: Liquid stool is noted within the patient's colon. This may be related to prior colonoscopy preparation. There is no evidence for active  GI bleeding. A few clips are noted in the ascending colon, likely related to recent colonoscopy. --Appendix: Normal. Lymphatic: --No retroperitoneal lymphadenopathy. --No mesenteric lymphadenopathy. --No pelvic or inguinal lymphadenopathy. Reproductive: There is a 2.5 cm fluid attenuation structure that appears to be at the level of the proximal vagina/urethra. This is of doubtful clinical significance and may represent a Bartholin gland cyst or Skene duct cyst. Other: No ascites or free air. The abdominal wall is normal. Musculoskeletal. No acute displaced fractures. IMPRESSION: 1. No evidence for active GI bleeding. If there is slow active GI beading, a tagged red blood cell scan would be more sensitive for localization. 2. There is a small to moderate size hiatal hernia. The patient's reported small-bowel capsule is lodged within this hernia. 3. Cardiomegaly with reflux of contrast into the IVC, suggestive of right heart failure. 4. Atrophic kidneys. 5. There is a moderate amount of gas within the urinary bladder which may be related to prior instrumentation. 6. Stable vascular findings as described on the patient's CT from May 01, 2020. Aortic Atherosclerosis (ICD10-I70.0). Electronically Signed   By: Constance Holster M.D.   On: 05/04/2020 19:11   CT Angio Abd/Pel w/ and/or w/o  Result Date: 05/02/2020 CLINICAL DATA:  69 year old female currently COVID-19 positive with bleeding per rectum concerning for lower GI bleed. EXAM: CTA ABDOMEN AND PELVIS WITHOUT AND WITH CONTRAST TECHNIQUE: Multidetector CT imaging of the abdomen and pelvis was performed using the standard protocol during bolus administration of intravenous contrast. Multiplanar reconstructed images and MIPs were obtained and reviewed to evaluate the vascular anatomy. CONTRAST:  17mL OMNIPAQUE IOHEXOL 350 MG/ML SOLN COMPARISON:  Prior CT scan of the abdomen and pelvis 08/16/2019 FINDINGS: VASCULAR Aorta: Extensive heterogeneous mixed calcified and  fibrofatty atherosclerotic plaque throughout the abdominal  aorta. The infrarenal aorta is mildly aneurysmal at a maximum of 3 cm in diameter. Celiac: Calcified atherosclerotic plaque at the origin without significant stenosis. No aneurysm or dissection. SMA: Calcified atherosclerotic plaque results in at least moderate stenosis of the proximal SMA. Fibrofatty atherosclerotic plaque results in focal moderate stenosis in the mid SMA. Renals: There are 2 renal arteries bilaterally. All for renal arteries are heavily diseased with mixed calcified and fibrofatty atherosclerotic plaque. At least moderate stenoses present bilaterally worse on the left than the right. IMA: Heavily diseased with high-grade stenosis at the origin. The vessel opacifies distally. Inflow: Mixed calcified and fibrofatty atherosclerotic plaque extends into the inflow vessels bilaterally. Both common iliac arteries are ectatic measuring up to 1.8 cm on the left and 2.1 cm on the right. Focal moderate stenoses at the origins of the internal iliac arteries. 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 No significant stenosis or occlusion of the external iliac arteries. Proximal Outflow: Heavily diseased bilaterally. Moderate to high-grade stenosis of the right common femoral artery. Bilateral profunda femoral arteries are heavily diseased bilaterally. High-grade stenosis bordering on occlusion of the proximal right superficial femoral artery. Veins: No focal venous abnormality. Review of the MIP images confirms the above findings. NON-VASCULAR Lower chest: The visualized cardiac structures are normal in size. No pericardial effusion. Moderately large hiatal hernia. The lower lungs are clear. Hepatobiliary: Normal hepatic contour and morphology. Geographic hypoattenuation in the left hemi-liver adjacent to the fissure for the falciform ligament is nonspecific but most suggestive of benign focal fatty infiltration. No  discrete solid hepatic lesion. Gallbladder is unremarkable. No intra or extrahepatic biliary ductal dilatation. Pancreas: Unremarkable. No pancreatic ductal dilatation or surrounding inflammatory changes. Spleen: 0.9 cm circumscribed low-attenuation lesion in the posteromedial aspect of the spleen is too small for accurate characterization. Statistically, this is almost certainly a benign lesion such as a cyst. Adrenals/Urinary Tract: Normal adrenal glands. Renal atrophy bilaterally worse on the left than the right likely secondary to chronic ischemia related to renal artery stenoses. 1.0 cm low-attenuation lesion in the right interpolar kidney is too small for accurate characterization but remains unchanged compared to prior studies and is almost certainly benign. Right lower pole renal sinus cyst, stable. Air-fluid level present within the bladder. Additionally, there is a semi lunar collection of fluid surrounding the urethra. Stomach/Bowel: No evidence of contrast extravasation into bowel on either the arterial or portal venous phase. Negative for acute bleeding at this time. No significant diverticular disease. Stomach and duodenum are unremarkable. No evidence of bowel wall thickening or obstruction. A metallic clip is present in the ascending colon. Lymphatic: No suspicious lymphadenopathy. Reproductive: Dystrophic calcified uterine fibroid. No adnexal masses. Other: No significant abdominal wall hernia. No evidence of ascites. Musculoskeletal: No acute fracture or aggressive appearing lytic or blastic osseous lesion. Lower lumbar degenerative disc disease most notable at L5-S1. IMPRESSION: VASCULAR 1. No evidence of active GI bleeding. 2. 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 Of note, the pattern of disease is not suggestive of chronic mesenteric ischemia. 3. 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 4. 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 5. 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 Recommend followup by ultrasound in 3 years. This recommendation follows ACR consensus guidelines: White Paper of the ACR Incidental Findings  Committee II on Vascular Findings. J Am Coll Radiol 2013; 10:789-794. Aortic aneurysm NOS (ICD10-I71.9); Aortic Atherosclerosis (ICD10-I70.0). NON-VASCULAR 1. No evidence of bowel wall thickening, inflammation or significant diverticulosis. 2. Metallic clip visible in the ascending colon. 3. Bilateral renal atrophy, likely secondary to chronic ischemia related renal artery stenoses. 4. Air-fluid level in the bladder is presumably related to recent instrumentation. If the patient has not been recently catheterized, then cystitis with a gas-forming organism would be a consideration. 5. Urethrocele. 6. Moderate hiatal hernia. 7. Lower lumbar degenerative disc disease. Electronically Signed   By: Jacqulynn Cadet M.D.   On: 05/02/2020 08:47

## 2020-05-10 DIAGNOSIS — G9340 Encephalopathy, unspecified: Secondary | ICD-10-CM

## 2020-05-10 DIAGNOSIS — A419 Sepsis, unspecified organism: Principal | ICD-10-CM

## 2020-05-10 DIAGNOSIS — I4891 Unspecified atrial fibrillation: Secondary | ICD-10-CM

## 2020-05-10 DIAGNOSIS — J9601 Acute respiratory failure with hypoxia: Secondary | ICD-10-CM

## 2020-05-10 DIAGNOSIS — R195 Other fecal abnormalities: Secondary | ICD-10-CM

## 2020-05-10 DIAGNOSIS — R6521 Severe sepsis with septic shock: Secondary | ICD-10-CM

## 2020-05-10 DIAGNOSIS — I5043 Acute on chronic combined systolic (congestive) and diastolic (congestive) heart failure: Secondary | ICD-10-CM

## 2020-05-10 LAB — PROCALCITONIN: Procalcitonin: 10.68 ng/mL

## 2020-05-10 MED ORDER — SODIUM CHLORIDE 0.9 % IV BOLUS
250.0000 mL | Freq: Once | INTRAVENOUS | Status: AC
  Administered 2020-05-10: 250 mL via INTRAVENOUS

## 2020-05-10 MED ORDER — ALBUMIN HUMAN 5 % IV SOLN
12.5000 g | Freq: Once | INTRAVENOUS | Status: DC
  Filled 2020-05-10: qty 250

## 2020-05-14 LAB — CULTURE, BLOOD (ROUTINE X 2)
Culture: NO GROWTH
Culture: NO GROWTH
Special Requests: ADEQUATE

## 2020-05-29 NOTE — Death Summary Note (Addendum)
Death Summary  [name] [name] UQJ:[id number] DOB: 1951/03/22 DOA: 05/26/2020  PCP: [place] date: 05/17/2020 Date of Death: 05/15/20 Time of Death: 00:45 am Notification: [facility] notified of death of May 15, 2020   History of present illness:  [name] [name] is a 69 y.o. 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  She was found to have COVID-19 and nonhealing left foot wound after recent right toe amputation, critical ischemia involving the right lower extremity, and hyperkalemia attributed to missed dialysis sessions.  She also had recurrent upper GI bleeding.  She was started on antibiotics, was seen by vascular surgery, gastroenterology and nephrology, underwent dialysis in the hospital, and her course was complicated by refusal to complete bowel prep for endoscopy and refusal of lower extremity angiogram or amputation.  Palliative care was involved and after discussion with the patient's family, CODE STATUS was changed to DNR.  Patient had a negative bleeding scan, required blood transfusion, developed acute hypoxic respiratory failure suspected secondary to hypervolemia, had 3 L removed with dialysis yesterday, but continued to have worsening dyspnea and hypoxia, developed a fever, was recultured and started back on broad-spectrum antibiotics, but unfortunately continued to decline fairly rapidly, becoming obtunded and then hypotensive.  Patient's family was kept abreast of the situation and understood that the patient may not make it through the night.  She was given a fluid bolus and FiO2 was increased, but  continued to decline rapidly, did not appear to be in any pain and did not appear to be suffering, but stopped breathing and quickly developed asystole.  Family was notified.  Final Diagnoses:  1.   Acute upper GI bleeding 2.   Acute hypoxic respiratory failure  3.   Acute on chronic combined systolic and diastolic CHF  4.   ESRD  5.   CAD  6.   PAF  7.   COVID-19 positive  8.   Insulin-dependent DM  9.   PAD   The results of significant diagnostics from this hospitalization (including imaging, microbiology, ancillary and laboratory) are listed below for reference.    Significant Diagnostic Studies: DG Chest 2 View  Result Date: 05/26/2020 CLINICAL DATA:  69 year old female with suspected sepsis. Bilateral foot infection for the past 3 weeks. EXAM: CHEST - 2 VIEW COMPARISON:  Chest x-ray 04/04/2020. FINDINGS: Lung volumes are normal. No consolidative airspace disease. No pleural effusions. No evidence of frank pulmonary edema. Mild cephalization of the pulmonary vasculature. Mild cardiomegaly. Upper mediastinal contours are within normal limits. Aortic atherosclerosis. Status post median sternotomy for CABG. Right internal jugular PermCath with tip terminating in the mid to distal superior vena cava. Aortic atherosclerosis. IMPRESSION: 1. Postoperative changes and support apparatus, as above. 2. Mild cardiomegaly with pulmonary venous congestion, without frank pulmonary edema. 3. Aortic atherosclerosis. Electronically Signed   By: Vinnie Langton M.D.   On: 05/27/2020 14:52   DG Abd 1 View  Result Date: 05/04/2020 CLINICAL DATA:  Possible retained small-bowel capsule EXAM: ABDOMEN - 1 VIEW COMPARISON:  04/10/2020, CT 05/15/2020 FINDINGS: Nonobstructed gas pattern. Surgical clips in the right lower quadrant. Calcified pelvic phleboliths. Barrel-shaped metallic opacity measuring 17 mm projecting over expected location of  distal esophagus/GE junction. IMPRESSION: 1. 17 mm metallic barrel-shaped  opacity projects over the expected location of distal esophagus or GE junction. No additional metallic radiopaque foreign bodies are visible over the included abdomen or pelvis. Electronically Signed   By: Donavan Foil M.D.   On: 05/04/2020 17:39   NM GI Blood Loss  Result Date: 05/23/2020 CLINICAL DATA:  Hemoglobin with large bloody stools. EXAM: NUCLEAR MEDICINE GASTROINTESTINAL BLEEDING SCAN TECHNIQUE: Sequential abdominal images were obtained following intravenous administration of Tc-19m labeled red blood cells. RADIOPHARMACEUTICALS:  26.2 mCi Tc-99m pertechnetate in-vitro labeled red cells. COMPARISON:  None. FINDINGS: It should be noted that the study is limited secondary to an extensive amount of patient motion. Normal accumulation of tracer is seen within the vascular system. No areas of abnormal tracer uptake are seen within the large and small bowel. Tracer activity is seen extending across the right abdomen which represents contaminated tracer uptake within the patient's right hand. IMPRESSION: 1. Limited study, as described above, without evidence of active gastrointestinal bleeding at the time of the exam. Electronically Signed   By: Virgina Norfolk M.D.   On: 05/28/2020 23:36   CT FOOT RIGHT WO CONTRAST  Result Date: 04/12/2020 CLINICAL DATA:  Pain and swelling. EXAM: CT OF THE RIGHT FOOT WITHOUT CONTRAST TECHNIQUE: Multidetector CT imaging of the right foot was performed according to the standard protocol. Multiplanar CT image reconstructions were also generated. COMPARISON:  Radiographs 04/09/2020 FINDINGS: Diffuse subcutaneous soft tissue swelling/edema/fluid involving the entire ankle and foot. No focal fluid collection to suggest a drainable soft tissue abscess. Marked fatty atrophy of the foot and ankle musculature. Suspect myofasciitis involving the plantar foot musculature but no definite findings for pyomyositis. Suspect an open wound along the plantar aspect of the great toe but I  do not see any obvious destructive bony changes to suggest osteomyelitis. No definite findings for septic arthritis. The other bony structures of the foot and ankle appear intact. No obvious destructive bony changes, fractures or osteochondral lesions. Extensive small artery calcifications. IMPRESSION: 1. Diffuse subcutaneous soft tissue swelling/edema/fluid involving the entire ankle and foot consistent with cellulitis. No focal fluid collection to suggest a drainable soft tissue abscess. 2. Suspect myofasciitis involving the plantar foot musculature but no definite findings for pyomyositis. Severe diffuse fatty atrophy of the foot and ankle musculature. 3. Suspect an open wound along the plantar aspect of the great toe but I do not see any obvious destructive bony changes to suggest osteomyelitis. No definite findings for septic arthritis. 4. If symptoms persist or worsen MRI is suggested for further evaluation. Electronically Signed   By: Marijo Sanes M.D.   On: 04/12/2020 10:51   DG CHEST PORT 1 VIEW  Result Date: 05/09/2020 CLINICAL DATA:  Acute respiratory failure, hypoxia EXAM: PORTABLE CHEST 1 VIEW COMPARISON:  04/18/2020 FINDINGS: Single frontal view of the chest demonstrates stable right internal jugular dialysis catheter cardiac silhouette is enlarged. There is bilateral interstitial prominence with multifocal airspace disease, right greater than left. Volume loss right hemithorax. There is a right pleural effusion has developed in the interim. No pneumothorax. No acute bony abnormalities. IMPRESSION: 1. Bilateral interstitial and alveolar opacities consistent with edema. The areas of denser consolidation within the right lung likely reflect atelectasis, given volume loss. 2. New right pleural effusion.  This may be partially loculated. Electronically Signed   By: Randa Ngo M.D.   On: 05/09/2020 22:37   DG Abd Portable 1V  Result Date: 05/06/2020 CLINICAL DATA:  Altered mental  status.  COVID-19 positive. Apparent recurrent upper gastrointestinal bleeding EXAM: PORTABLE ABDOMEN - 1 VIEW COMPARISON:  May 04, 2020. FINDINGS: Previously noted radiopaque foreign body is now in or overlying the distal small bowel in the right upper pelvic region. There is no bowel dilatation or air-fluid level to suggest bowel obstruction. No free air. There are apparent phleboliths in the pelvis. IMPRESSION: Metallic foreign body in or overlying the distal small bowel region. No bowel obstruction or free air. Electronically Signed   By: Lowella Grip III M.D.   On: 05/06/2020 11:13   DG Foot Complete Left  Result Date: 04/03/2020 CLINICAL DATA:  Recent amputation. Suspect sepsis. Bilateral foot infection 3 weeks. EXAM: LEFT FOOT - COMPLETE 3+ VIEW COMPARISON:  04/09/2020 FINDINGS: There is generalized decreased bone mineralization. Evidence of patient's recent amputation distal to the distal shaft of the first metatarsal. Expected postsurgical changes at the amputation site. No focal bone destruction to suggest osteomyelitis. No air within the soft tissues. Exam is otherwise unchanged. IMPRESSION: Expected changes post amputation distal to the distal aspect of the first metatarsal. No definite bone destruction to suggest osteomyelitis. No air within the soft tissues. Electronically Signed   By: Marin Olp M.D.   On: 04/20/2020 14:58   DG Foot Complete Right  Result Date: 04/08/2020 CLINICAL DATA:  Recent amputation with possible infection three weeks. Suspect sepsis. EXAM: RIGHT FOOT COMPLETE - 3+ VIEW COMPARISON:  CT 04/12/2020 FINDINGS: Diffuse decreased bone mineralization. Degenerative change over the first MTP joint and interphalangeal joints. Mild horizontal sclerosis with subtle lucency involving the head of the fifth metatarsal possibly subacute fracture. No focal bone destruction to suggest osteomyelitis. No air within the soft tissues. IMPRESSION: 1. No definite evidence of osteomyelitis and no  air within the soft tissues. 2. Linear sclerosis with subtle lucency involving the head of the fifth metatarsal which may be due to nondisplaced subacute fracture. Electronically Signed   By: Marin Olp M.D.   On: 04/11/2020 14:56   CT Angio Abd/Pel w/ and/or w/o  Result Date: 05/04/2020 CLINICAL DATA:  GI bleed. Persistent bleeding. EGD and colonoscopy were negative. EXAM: CTA ABDOMEN AND PELVIS WITHOUT AND WITH CONTRAST TECHNIQUE: Multidetector CT imaging of the abdomen and pelvis was performed using the standard protocol during bolus administration of intravenous contrast. Multiplanar reconstructed images and MIPs were obtained and reviewed to evaluate the vascular anatomy. CONTRAST:  160mL OMNIPAQUE IOHEXOL 350 MG/ML SOLN COMPARISON:  CT dated May 01, 2020 FINDINGS: VASCULAR Aorta: Again noted are atherosclerotic changes of the abdominal aorta with an infrarenal abdominal aortic aneurysm measuring approximately 3-3.2 cm. This is stable in appearance from prior study. Celiac: Atherosclerotic changes are noted of the celiac axis likely resulting in mild stenosis. SMA: Heavy calcifications are noted throughout the SMA likely resulting in tandem mild-to-moderate areas of stenosis. Renals: There are heavy calcifications at the origin of both renal arteries likely resulting in some degree of stenosis. IMA: The IMA is patent. Inflow: There is ectasia of the bilateral common iliac arteries as before. Proximal Outflow: Heavy atherosclerotic changes are noted. Veins: No obvious venous abnormality within the limitations of this arterial phase study. Review of the MIP images confirms the above findings. NON-VASCULAR Lower chest: There is atelectasis at the lung bases.The heart is enlarged. There is reflux of contrast into the IVC. Hepatobiliary: The liver is normal. There appears to be gallbladder sludge or small stones without CT evidence for cholecystitis.There is no biliary ductal dilation. Pancreas: Normal contours  without ductal  dilatation. No peripancreatic fluid collection. Spleen: There is a small cystic structure in the spleen, of doubtful clinical significance. Adrenals/Urinary Tract: --Adrenal glands: Unremarkable. --Right kidney/ureter: The right kidney is atrophic without evidence for hydronephrosis. There may be a nonobstructing stone in the lower pole. --Left kidney/ureter: The left kidney is atrophic without evidence for hydronephrosis. --Urinary bladder: There is a moderate amount of gas within the urinary bladder which may related to prior instrumentation. Stomach/Bowel: --Stomach/Duodenum: There is a small to moderate-sized hiatal hernia. The patient's reported small-bowel capsule is likely lodged proximal to the GE junction within the patient's hernia. --Small bowel: Unremarkable. --Colon: Liquid stool is noted within the patient's colon. This may be related to prior colonoscopy preparation. There is no evidence for active GI bleeding. A few clips are noted in the ascending colon, likely related to recent colonoscopy. --Appendix: Normal. Lymphatic: --No retroperitoneal lymphadenopathy. --No mesenteric lymphadenopathy. --No pelvic or inguinal lymphadenopathy. Reproductive: There is a 2.5 cm fluid attenuation structure that appears to be at the level of the proximal vagina/urethra. This is of doubtful clinical significance and may represent a Bartholin gland cyst or Skene duct cyst. Other: No ascites or free air. The abdominal wall is normal. Musculoskeletal. No acute displaced fractures. IMPRESSION: 1. No evidence for active GI bleeding. If there is slow active GI beading, a tagged red blood cell scan would be more sensitive for localization. 2. There is a small to moderate size hiatal hernia. The patient's reported small-bowel capsule is lodged within this hernia. 3. Cardiomegaly with reflux of contrast into the IVC, suggestive of right heart failure. 4. Atrophic kidneys. 5. There is a moderate amount of gas  within the urinary bladder which may be related to prior instrumentation. 6. Stable vascular findings as described on the patient's CT from May 01, 2020. Aortic Atherosclerosis (ICD10-I70.0). Electronically Signed   By: Constance Holster M.D.   On: 05/04/2020 19:11   CT Angio Abd/Pel w/ and/or w/o  Result Date: 05/02/2020 CLINICAL DATA:  69 year old female currently COVID-19 positive with bleeding per rectum concerning for lower GI bleed. EXAM: CTA ABDOMEN AND PELVIS WITHOUT AND WITH CONTRAST TECHNIQUE: Multidetector CT imaging of the abdomen and pelvis was performed using the standard protocol during bolus administration of intravenous contrast. Multiplanar reconstructed images and MIPs were obtained and reviewed to evaluate the vascular anatomy. CONTRAST:  134mL OMNIPAQUE IOHEXOL 350 MG/ML SOLN COMPARISON:  Prior CT scan of the abdomen and pelvis 08/16/2019 FINDINGS: VASCULAR Aorta: Extensive heterogeneous mixed calcified and fibrofatty atherosclerotic plaque throughout the abdominal aorta. The infrarenal aorta is mildly aneurysmal at a maximum of 3 cm in diameter. Celiac: Calcified atherosclerotic plaque at the origin without significant stenosis. No aneurysm or dissection. SMA: Calcified atherosclerotic plaque results in at least moderate stenosis of the proximal SMA. Fibrofatty atherosclerotic plaque results in focal moderate stenosis in the mid SMA. Renals: There are 2 renal arteries bilaterally. All for renal arteries are heavily diseased with mixed calcified and fibrofatty atherosclerotic plaque. At least moderate stenoses present bilaterally worse on the left than the right. IMA: Heavily diseased with high-grade stenosis at the origin. The vessel opacifies distally. Inflow: Mixed calcified and fibrofatty atherosclerotic plaque extends into the inflow vessels bilaterally. Both common iliac arteries are ectatic measuring up to 1.8 cm on the left and 2.1 cm on the right. Focal moderate stenoses at the  origins of the internal iliac arteries. 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 No significant stenosis or occlusion of the external iliac  arteries. Proximal Outflow: Heavily diseased bilaterally. Moderate to high-grade stenosis of the right common femoral artery. Bilateral profunda femoral arteries are heavily diseased bilaterally. High-grade stenosis bordering on occlusion of the proximal right superficial femoral artery. Veins: No focal venous abnormality. Review of the MIP images confirms the above findings. NON-VASCULAR Lower chest: The visualized cardiac structures are normal in size. No pericardial effusion. Moderately large hiatal hernia. The lower lungs are clear. Hepatobiliary: Normal hepatic contour and morphology. Geographic hypoattenuation in the left hemi-liver adjacent to the fissure for the falciform ligament is nonspecific but most suggestive of benign focal fatty infiltration. No discrete solid hepatic lesion. Gallbladder is unremarkable. No intra or extrahepatic biliary ductal dilatation. Pancreas: Unremarkable. No pancreatic ductal dilatation or surrounding inflammatory changes. Spleen: 0.9 cm circumscribed low-attenuation lesion in the posteromedial aspect of the spleen is too small for accurate characterization. Statistically, this is almost certainly a benign lesion such as a cyst. Adrenals/Urinary Tract: Normal adrenal glands. Renal atrophy bilaterally worse on the left than the right likely secondary to chronic ischemia related to renal artery stenoses. 1.0 cm low-attenuation lesion in the right interpolar kidney is too small for accurate characterization but remains unchanged compared to prior studies and is almost certainly benign. Right lower pole renal sinus cyst, stable. Air-fluid level present within the bladder. Additionally, there is a semi lunar collection of fluid surrounding the urethra. Stomach/Bowel: No evidence of contrast extravasation into  bowel on either the arterial or portal venous phase. Negative for acute bleeding at this time. No significant diverticular disease. Stomach and duodenum are unremarkable. No evidence of bowel wall thickening or obstruction. A metallic clip is present in the ascending colon. Lymphatic: No suspicious lymphadenopathy. Reproductive: Dystrophic calcified uterine fibroid. No adnexal masses. Other: No significant abdominal wall hernia. No evidence of ascites. Musculoskeletal: No acute fracture or aggressive appearing lytic or blastic osseous lesion. Lower lumbar degenerative disc disease most notable at L5-S1. IMPRESSION: VASCULAR 1. No evidence of active GI bleeding. 2. 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 Of note, the pattern of disease is not suggestive of chronic mesenteric ischemia. 3. 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 4. 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 5. 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 Recommend followup by ultrasound in 3 years. This recommendation follows ACR consensus guidelines: White Paper of the ACR Incidental Findings Committee II on Vascular Findings. J Am Coll Radiol 2013; 10:789-794. Aortic aneurysm NOS (ICD10-I71.9); Aortic Atherosclerosis (ICD10-I70.0). NON-VASCULAR 1. No evidence of bowel wall thickening, inflammation or significant diverticulosis. 2. Metallic clip visible in the ascending colon. 3. Bilateral renal atrophy, likely secondary to chronic ischemia related renal artery stenoses. 4. Air-fluid level in the bladder is presumably related to recent instrumentation. If the patient has not been recently catheterized, then cystitis with a gas-forming organism would be a consideration. 5. Urethrocele. 6. Moderate hiatal hernia. 7. Lower lumbar degenerative disc disease. Electronically  Signed   By: Jacqulynn Cadet M.D.   On: 05/02/2020 08:47    Microbiology: No results found for this or any previous visit (from the past 240 hour(s)).   Labs: Basic Metabolic Panel: Recent Labs  Lab 05/27/2020 0840 05/01/2020 0840 05/06/20 3810 05/06/20 1751 05/22/2020 0320 05/07/20 0320 05/08/20 0401 05/09/20 0501  NA 139  --  139  --  141  --  139 138  K 3.8   < > 3.6   < > 3.7   < > 4.1 5.7*  CL 102  --  100  --  102  --  101 99  CO2 22  --  23  --  23  --  23 21*  GLUCOSE 90  --  106*  --  93  --  114* 182*  BUN 18  --  13  --  25*  --  38* 47*  CREATININE 5.86*  --  3.94*  --  5.68*  --  7.49* 8.97*  CALCIUM 7.6*  --  7.7*  --  7.5*  --  7.3* 7.4*  PHOS 3.8  --  2.6  --  3.9  --  5.3* 7.3*   < > = values in this interval not displayed.   Liver Function Tests: Recent Labs  Lab 05/11/2020 0840 05/06/20 1224 05/03/2020 0320 05/08/20 0401 05/09/20 0501  ALBUMIN 2.0* 1.9* 1.7* 1.7* 2.0*   No results for input(s): LIPASE, AMYLASE in the last 168 hours. No results for input(s): AMMONIA in the last 168 hours. CBC: Recent Labs  Lab 05/01/2020 0840 05/09/2020 0840 05/06/20 0632 05/09/2020 0320 05/08/20 0401 05/08/20 1829 05/09/20 0501  WBC 7.0  --  7.6 7.3 6.6  --  12.6*  NEUTROABS  --   --   --  5.6 5.5  --  11.5*  HGB 7.3*   < > 8.0* 7.5* 6.6* 10.8* 11.1*  HCT 24.0*   < > 25.4* 24.5* 21.8* 33.5* 34.9*  MCV 91.6  --  88.8 91.8 89.3  --  88.4  PLT 295  --  242 266 246  --  316   < > = values in this interval not displayed.   Cardiac Enzymes: No results for input(s): CKTOTAL, CKMB, CKMBINDEX, TROPONINI in the last 168 hours. D-Dimer No results for input(s): DDIMER in the last 72 hours. BNP: Invalid input(s): POCBNP CBG: Recent Labs  Lab 05/09/20 1614 05/09/20 2115 05/09/20 2127 05/09/20 2156 05/09/20 2246  GLUCAP 214* 61* 55* 175* 111*   Anemia work up No results for input(s): VITAMINB12, FOLATE, FERRITIN, TIBC, IRON, RETICCTPCT in the last 72  hours. Urinalysis    Component Value Date/Time   COLORURINE YELLOW 08/19/2019 0712   APPEARANCEUR HAZY (A) 08/19/2019 0712   LABSPEC 1.012 08/19/2019 0712   PHURINE 5.0 08/19/2019 0712   GLUCOSEU NEGATIVE 08/19/2019 0712   HGBUR NEGATIVE 08/19/2019 0712   BILIRUBINUR NEGATIVE 08/19/2019 0712   KETONESUR NEGATIVE 08/19/2019 0712   PROTEINUR NEGATIVE 08/19/2019 0712   UROBILINOGEN 0.2 05/12/2015 0938   NITRITE NEGATIVE 08/19/2019 0712   LEUKOCYTESUR SMALL (A) 08/19/2019 0712   Sepsis Labs Invalid input(s): PROCALCITONIN,  WBC,  LACTICIDVEN     SIGNED:  Vianne Bulls, MD  [facility] 05/26/20, 1:13 AM Pager   If 7PM-7AM, please contact night-coverage [email or web address] Password [email or web address]

## 2020-05-29 NOTE — Progress Notes (Addendum)
Notified [employer]. Spoke with Vevelyn Royals and she took the patient's information and assigned a referral number. The patient's referral number is [id number].  At 0300, notified Brandy in Patient Placement that patient had been transferred to the morgue.

## 2020-05-29 DEATH — deceased

## 2020-07-27 IMAGING — CR CHEST - 2 VIEW
2 series · 2 of 2 positions shown · non-contrast
Comparison: Chest radiograph 08/16/2019

CLINICAL DATA: Patient with hypoxemia.

EXAM:
CHEST - 2 VIEW

[chest lat]
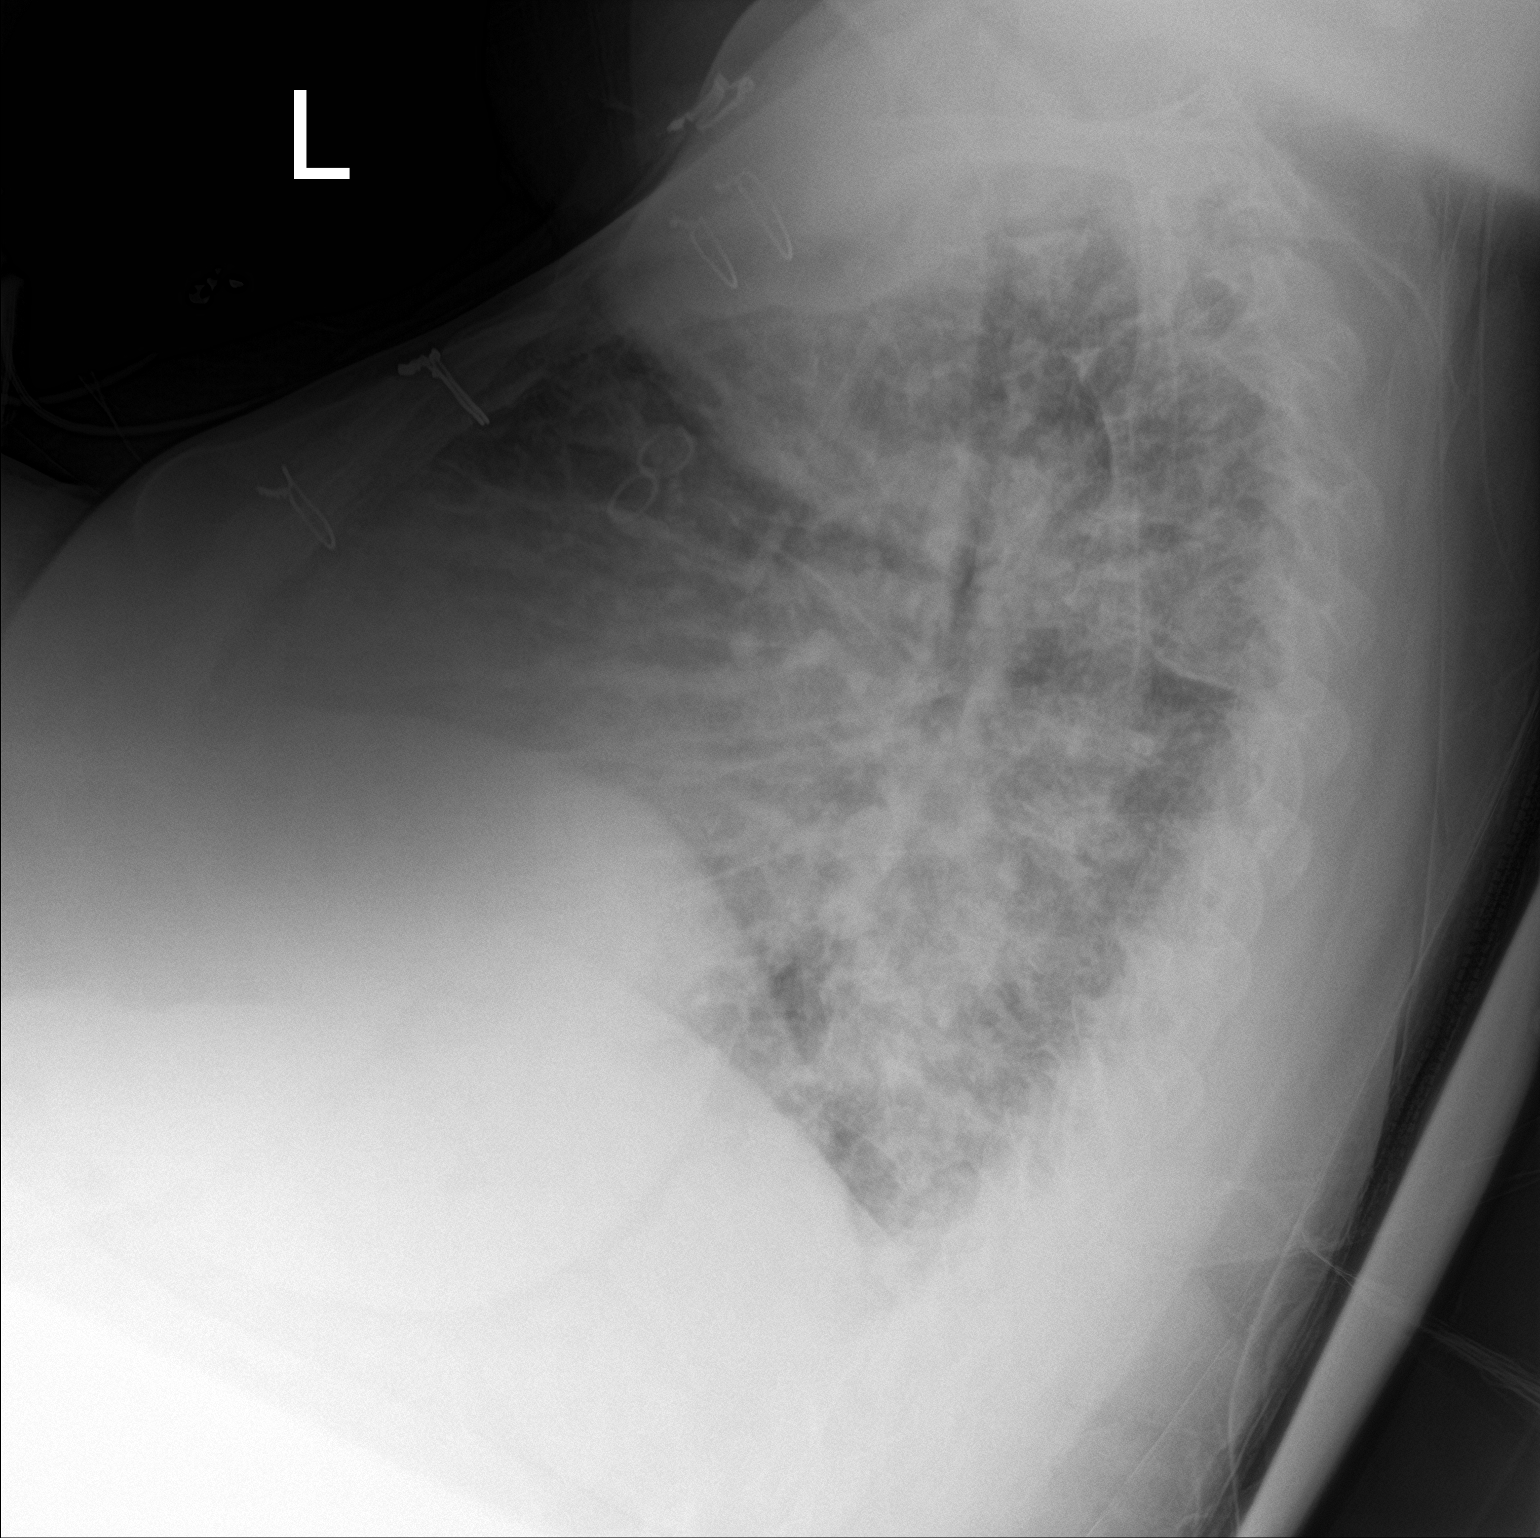

[chest ap]
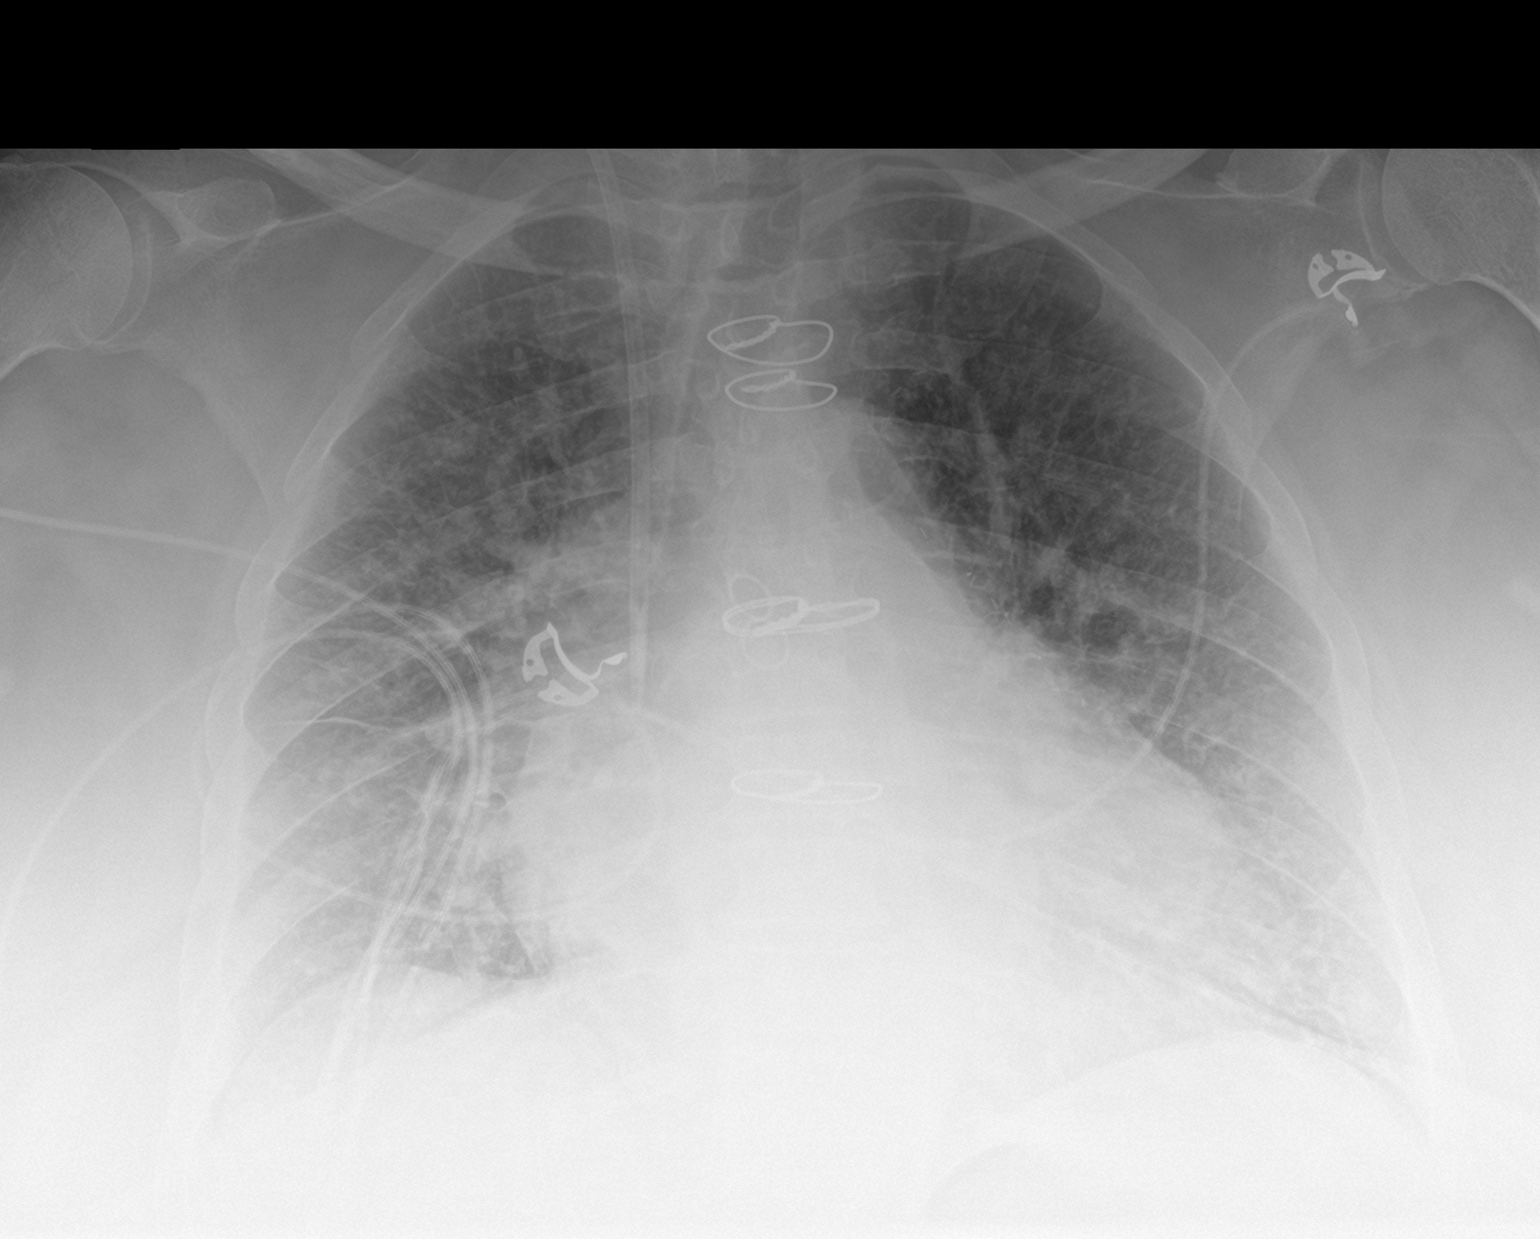

[2 of 2 positions shown; findings below may reference images not displayed]

FINDINGS: Interval insertion right IJ central venous catheter tip projects
over the superior vena cava. Monitoring leads overlie the patient.
Stable cardiomegaly status post median sternotomy. Similar-appearing
diffuse bilateral interstitial pulmonary opacities. Trace bilateral
effusions. Thoracic spine degenerative changes.
IMPRESSION: Cardiomegaly with mild interstitial pulmonary edema.

New right IJ central venous catheter tip projects over the superior
vena cava.
